# Patient Record
Sex: Female | Born: 1965
Health system: Southern US, Community
[De-identification: ages and names within clinical notes are randomized; demographics above are authoritative.]

## PROBLEM LIST (undated history)

## (undated) DIAGNOSIS — K589 Irritable bowel syndrome without diarrhea: Secondary | ICD-10-CM

## (undated) DIAGNOSIS — J42 Unspecified chronic bronchitis: Secondary | ICD-10-CM

## (undated) DIAGNOSIS — E1142 Type 2 diabetes mellitus with diabetic polyneuropathy: Secondary | ICD-10-CM

## (undated) DIAGNOSIS — R079 Chest pain, unspecified: Secondary | ICD-10-CM

## (undated) DIAGNOSIS — Z9989 Dependence on other enabling machines and devices: Secondary | ICD-10-CM

## (undated) DIAGNOSIS — F1010 Alcohol abuse, uncomplicated: Secondary | ICD-10-CM

## (undated) DIAGNOSIS — F17200 Nicotine dependence, unspecified, uncomplicated: Secondary | ICD-10-CM

## (undated) DIAGNOSIS — G4733 Obstructive sleep apnea (adult) (pediatric): Secondary | ICD-10-CM

## (undated) DIAGNOSIS — J449 Chronic obstructive pulmonary disease, unspecified: Secondary | ICD-10-CM

## (undated) DIAGNOSIS — K219 Gastro-esophageal reflux disease without esophagitis: Secondary | ICD-10-CM

## (undated) DIAGNOSIS — M7989 Other specified soft tissue disorders: Secondary | ICD-10-CM

## (undated) DIAGNOSIS — M549 Dorsalgia, unspecified: Secondary | ICD-10-CM

## (undated) DIAGNOSIS — M1990 Unspecified osteoarthritis, unspecified site: Secondary | ICD-10-CM

## (undated) DIAGNOSIS — H409 Unspecified glaucoma: Secondary | ICD-10-CM

## (undated) DIAGNOSIS — R519 Headache, unspecified: Secondary | ICD-10-CM

## (undated) DIAGNOSIS — D509 Iron deficiency anemia, unspecified: Secondary | ICD-10-CM

## (undated) DIAGNOSIS — K5900 Constipation, unspecified: Secondary | ICD-10-CM

## (undated) DIAGNOSIS — F909 Attention-deficit hyperactivity disorder, unspecified type: Secondary | ICD-10-CM

## (undated) DIAGNOSIS — R0789 Other chest pain: Secondary | ICD-10-CM

## (undated) DIAGNOSIS — N920 Excessive and frequent menstruation with regular cycle: Secondary | ICD-10-CM

## (undated) DIAGNOSIS — T8859XA Other complications of anesthesia, initial encounter: Secondary | ICD-10-CM

## (undated) DIAGNOSIS — E559 Vitamin D deficiency, unspecified: Secondary | ICD-10-CM

## (undated) DIAGNOSIS — J45909 Unspecified asthma, uncomplicated: Secondary | ICD-10-CM

## (undated) DIAGNOSIS — J9610 Chronic respiratory failure, unspecified whether with hypoxia or hypercapnia: Secondary | ICD-10-CM

## (undated) DIAGNOSIS — D649 Anemia, unspecified: Secondary | ICD-10-CM

## (undated) DIAGNOSIS — J9611 Chronic respiratory failure with hypoxia: Secondary | ICD-10-CM

## (undated) DIAGNOSIS — K259 Gastric ulcer, unspecified as acute or chronic, without hemorrhage or perforation: Secondary | ICD-10-CM

## (undated) DIAGNOSIS — M545 Low back pain, unspecified: Secondary | ICD-10-CM

## (undated) DIAGNOSIS — R112 Nausea with vomiting, unspecified: Secondary | ICD-10-CM

## (undated) DIAGNOSIS — K648 Other hemorrhoids: Secondary | ICD-10-CM

## (undated) DIAGNOSIS — I10 Essential (primary) hypertension: Secondary | ICD-10-CM

## (undated) DIAGNOSIS — G8929 Other chronic pain: Secondary | ICD-10-CM

## (undated) DIAGNOSIS — T4145XA Adverse effect of unspecified anesthetic, initial encounter: Secondary | ICD-10-CM

## (undated) DIAGNOSIS — E785 Hyperlipidemia, unspecified: Secondary | ICD-10-CM

## (undated) DIAGNOSIS — Z9981 Dependence on supplemental oxygen: Secondary | ICD-10-CM

## (undated) DIAGNOSIS — J189 Pneumonia, unspecified organism: Secondary | ICD-10-CM

## (undated) DIAGNOSIS — Z9889 Other specified postprocedural states: Secondary | ICD-10-CM

## (undated) DIAGNOSIS — F32A Depression, unspecified: Secondary | ICD-10-CM

## (undated) DIAGNOSIS — F1990 Other psychoactive substance use, unspecified, uncomplicated: Secondary | ICD-10-CM

## (undated) DIAGNOSIS — E782 Mixed hyperlipidemia: Secondary | ICD-10-CM

## (undated) DIAGNOSIS — Z8719 Personal history of other diseases of the digestive system: Secondary | ICD-10-CM

## (undated) DIAGNOSIS — E1169 Type 2 diabetes mellitus with other specified complication: Secondary | ICD-10-CM

## (undated) DIAGNOSIS — R918 Other nonspecific abnormal finding of lung field: Secondary | ICD-10-CM

## (undated) DIAGNOSIS — K829 Disease of gallbladder, unspecified: Secondary | ICD-10-CM

## (undated) DIAGNOSIS — F419 Anxiety disorder, unspecified: Secondary | ICD-10-CM

## (undated) DIAGNOSIS — E119 Type 2 diabetes mellitus without complications: Secondary | ICD-10-CM

## (undated) DIAGNOSIS — E1149 Type 2 diabetes mellitus with other diabetic neurological complication: Secondary | ICD-10-CM

## (undated) DIAGNOSIS — Z9289 Personal history of other medical treatment: Secondary | ICD-10-CM

## (undated) DIAGNOSIS — E1165 Type 2 diabetes mellitus with hyperglycemia: Secondary | ICD-10-CM

## (undated) DIAGNOSIS — R809 Proteinuria, unspecified: Secondary | ICD-10-CM

## (undated) DIAGNOSIS — R071 Chest pain on breathing: Secondary | ICD-10-CM

## (undated) DIAGNOSIS — R000 Tachycardia, unspecified: Secondary | ICD-10-CM

## (undated) DIAGNOSIS — M2550 Pain in unspecified joint: Secondary | ICD-10-CM

## (undated) DIAGNOSIS — E7800 Pure hypercholesterolemia, unspecified: Secondary | ICD-10-CM

## (undated) DIAGNOSIS — F17210 Nicotine dependence, cigarettes, uncomplicated: Secondary | ICD-10-CM

## (undated) DIAGNOSIS — R0602 Shortness of breath: Secondary | ICD-10-CM

## (undated) DIAGNOSIS — E1159 Type 2 diabetes mellitus with other circulatory complications: Secondary | ICD-10-CM

## (undated) DIAGNOSIS — F1911 Other psychoactive substance abuse, in remission: Secondary | ICD-10-CM

## (undated) DIAGNOSIS — R51 Headache: Secondary | ICD-10-CM

## (undated) DIAGNOSIS — F329 Major depressive disorder, single episode, unspecified: Secondary | ICD-10-CM

## (undated) HISTORY — DX: Essential (primary) hypertension: I10

## (undated) HISTORY — DX: Chronic obstructive pulmonary disease, unspecified: J44.9

## (undated) HISTORY — DX: Alcohol abuse, uncomplicated: F10.10

## (undated) HISTORY — DX: Pain in unspecified joint: M25.50

## (undated) HISTORY — DX: Type 2 diabetes mellitus with hyperglycemia: E11.65

## (undated) HISTORY — DX: Tachycardia, unspecified: R00.0

## (undated) HISTORY — PX: KIDNEY SURGERY: SHX687

## (undated) HISTORY — PX: OTHER SURGICAL HISTORY: SHX169

## (undated) HISTORY — DX: Type 2 diabetes mellitus with other circulatory complications: E11.59

## (undated) HISTORY — DX: Mixed hyperlipidemia: E78.2

## (undated) HISTORY — DX: Major depressive disorder, single episode, unspecified: F32.9

## (undated) HISTORY — DX: Other specified soft tissue disorders: M79.89

## (undated) HISTORY — DX: Other psychoactive substance use, unspecified, uncomplicated: F19.90

## (undated) HISTORY — DX: Unspecified glaucoma: H40.9

## (undated) HISTORY — DX: Nicotine dependence, unspecified, uncomplicated: F17.200

## (undated) HISTORY — DX: Nicotine dependence, cigarettes, uncomplicated: F17.210

## (undated) HISTORY — DX: Constipation, unspecified: K59.00

## (undated) HISTORY — DX: Type 2 diabetes mellitus with other diabetic neurological complication: E11.49

## (undated) HISTORY — DX: Depression, unspecified: F32.A

## (undated) HISTORY — DX: Attention-deficit hyperactivity disorder, unspecified type: F90.9

## (undated) HISTORY — DX: Type 2 diabetes mellitus with other specified complication: E11.69

## (undated) HISTORY — DX: Other psychoactive substance abuse, in remission: F19.11

## (undated) HISTORY — DX: Other nonspecific abnormal finding of lung field: R91.8

## (undated) HISTORY — DX: Vitamin D deficiency, unspecified: E55.9

## (undated) HISTORY — DX: Chest pain on breathing: R07.1

## (undated) HISTORY — DX: Dependence on supplemental oxygen: Z99.81

## (undated) HISTORY — PX: TONSILLECTOMY: SUR1361

## (undated) HISTORY — DX: Irritable bowel syndrome, unspecified: K58.9

## (undated) HISTORY — DX: Other chest pain: R07.89

## (undated) HISTORY — DX: Disease of gallbladder, unspecified: K82.9

## (undated) HISTORY — DX: Hyperlipidemia, unspecified: E78.5

## (undated) HISTORY — PX: FRACTURE SURGERY: SHX138

## (undated) HISTORY — DX: Dorsalgia, unspecified: M54.9

## (undated) HISTORY — DX: Chest pain, unspecified: R07.9

## (undated) HISTORY — PX: TYMPANOSTOMY TUBE PLACEMENT: SHX32

## (undated) HISTORY — DX: Iron deficiency anemia, unspecified: D50.9

## (undated) HISTORY — DX: Chronic respiratory failure, unspecified whether with hypoxia or hypercapnia: J96.10

---
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
## 1987-04-25 HISTORY — PX: TUBAL LIGATION: SHX77

## 1988-04-24 HISTORY — PX: CHOLECYSTECTOMY OPEN: SUR202

## 1993-04-24 HISTORY — PX: WRIST FRACTURE SURGERY: SHX121

## 1997-10-14 ENCOUNTER — Emergency Department (HOSPITAL_COMMUNITY): Admission: EM | Admit: 1997-10-14 | Discharge: 1997-10-14 | Payer: Self-pay | Admitting: Emergency Medicine

## 1999-03-26 ENCOUNTER — Emergency Department (HOSPITAL_COMMUNITY): Admission: EM | Admit: 1999-03-26 | Discharge: 1999-03-26 | Payer: Self-pay | Admitting: Emergency Medicine

## 1999-03-26 ENCOUNTER — Encounter: Payer: Self-pay | Admitting: Emergency Medicine

## 1999-09-23 ENCOUNTER — Emergency Department (HOSPITAL_COMMUNITY): Admission: EM | Admit: 1999-09-23 | Discharge: 1999-09-23 | Payer: Self-pay | Admitting: Emergency Medicine

## 1999-09-23 ENCOUNTER — Encounter: Payer: Self-pay | Admitting: Emergency Medicine

## 1999-11-18 ENCOUNTER — Other Ambulatory Visit: Admission: RE | Admit: 1999-11-18 | Discharge: 1999-11-18 | Payer: Self-pay | Admitting: Obstetrics and Gynecology

## 2000-05-22 ENCOUNTER — Emergency Department (HOSPITAL_COMMUNITY): Admission: EM | Admit: 2000-05-22 | Discharge: 2000-05-22 | Payer: Self-pay | Admitting: Emergency Medicine

## 2001-03-04 ENCOUNTER — Emergency Department (HOSPITAL_COMMUNITY): Admission: EM | Admit: 2001-03-04 | Discharge: 2001-03-04 | Payer: Self-pay

## 2001-03-06 ENCOUNTER — Emergency Department (HOSPITAL_COMMUNITY): Admission: EM | Admit: 2001-03-06 | Discharge: 2001-03-06 | Payer: Self-pay | Admitting: *Deleted

## 2001-11-07 ENCOUNTER — Encounter: Payer: Self-pay | Admitting: Emergency Medicine

## 2001-11-07 ENCOUNTER — Emergency Department (HOSPITAL_COMMUNITY): Admission: EM | Admit: 2001-11-07 | Discharge: 2001-11-07 | Payer: Self-pay | Admitting: Emergency Medicine

## 2001-11-30 ENCOUNTER — Emergency Department (HOSPITAL_COMMUNITY): Admission: EM | Admit: 2001-11-30 | Discharge: 2001-11-30 | Payer: Self-pay | Admitting: Emergency Medicine

## 2002-07-01 ENCOUNTER — Emergency Department (HOSPITAL_COMMUNITY): Admission: EM | Admit: 2002-07-01 | Discharge: 2002-07-01 | Payer: Self-pay | Admitting: Emergency Medicine

## 2002-09-26 ENCOUNTER — Encounter: Payer: Self-pay | Admitting: Emergency Medicine

## 2002-09-27 ENCOUNTER — Inpatient Hospital Stay (HOSPITAL_COMMUNITY): Admission: EM | Admit: 2002-09-27 | Discharge: 2002-09-30 | Payer: Self-pay | Admitting: Emergency Medicine

## 2002-10-08 ENCOUNTER — Ambulatory Visit (HOSPITAL_COMMUNITY): Admission: RE | Admit: 2002-10-08 | Discharge: 2002-10-08 | Payer: Self-pay | Admitting: Family Medicine

## 2002-10-28 ENCOUNTER — Encounter: Admission: RE | Admit: 2002-10-28 | Discharge: 2002-10-28 | Payer: Self-pay | Admitting: Family Medicine

## 2003-03-17 ENCOUNTER — Emergency Department (HOSPITAL_COMMUNITY): Admission: EM | Admit: 2003-03-17 | Discharge: 2003-03-17 | Payer: Self-pay | Admitting: Emergency Medicine

## 2004-07-08 ENCOUNTER — Emergency Department (HOSPITAL_COMMUNITY): Admission: EM | Admit: 2004-07-08 | Discharge: 2004-07-08 | Payer: Self-pay | Admitting: Emergency Medicine

## 2004-10-26 ENCOUNTER — Emergency Department (HOSPITAL_COMMUNITY): Admission: EM | Admit: 2004-10-26 | Discharge: 2004-10-27 | Payer: Self-pay | Admitting: Emergency Medicine

## 2005-02-15 ENCOUNTER — Inpatient Hospital Stay (HOSPITAL_COMMUNITY): Admission: EM | Admit: 2005-02-15 | Discharge: 2005-02-20 | Payer: Self-pay | Admitting: Emergency Medicine

## 2006-05-13 ENCOUNTER — Emergency Department (HOSPITAL_COMMUNITY): Admission: EM | Admit: 2006-05-13 | Discharge: 2006-05-13 | Payer: Self-pay | Admitting: Emergency Medicine

## 2007-03-16 ENCOUNTER — Emergency Department (HOSPITAL_COMMUNITY): Admission: EM | Admit: 2007-03-16 | Discharge: 2007-03-16 | Payer: Self-pay | Admitting: Emergency Medicine

## 2007-10-28 ENCOUNTER — Inpatient Hospital Stay (HOSPITAL_COMMUNITY): Admission: EM | Admit: 2007-10-28 | Discharge: 2007-11-04 | Payer: Self-pay | Admitting: Emergency Medicine

## 2008-04-07 ENCOUNTER — Emergency Department (HOSPITAL_COMMUNITY): Admission: EM | Admit: 2008-04-07 | Discharge: 2008-04-07 | Payer: Self-pay | Admitting: Emergency Medicine

## 2008-09-04 ENCOUNTER — Emergency Department (HOSPITAL_COMMUNITY): Admission: EM | Admit: 2008-09-04 | Discharge: 2008-09-04 | Payer: Self-pay | Admitting: Emergency Medicine

## 2009-12-06 ENCOUNTER — Emergency Department (HOSPITAL_COMMUNITY): Admission: EM | Admit: 2009-12-06 | Discharge: 2009-12-06 | Payer: Self-pay | Admitting: Emergency Medicine

## 2010-02-18 ENCOUNTER — Emergency Department (HOSPITAL_COMMUNITY): Admission: EM | Admit: 2010-02-18 | Discharge: 2010-02-18 | Payer: Self-pay | Admitting: Emergency Medicine

## 2010-03-13 ENCOUNTER — Emergency Department (HOSPITAL_COMMUNITY): Admission: EM | Admit: 2010-03-13 | Discharge: 2010-03-13 | Payer: Self-pay | Admitting: Emergency Medicine

## 2010-07-06 LAB — CBC
HCT: 31.4 % — ABNORMAL LOW (ref 36.0–46.0)
Hemoglobin: 9.6 g/dL — ABNORMAL LOW (ref 12.0–15.0)
MCH: 20.9 pg — ABNORMAL LOW (ref 26.0–34.0)
MCHC: 30.8 g/dL (ref 30.0–36.0)
MCV: 67.8 fL — ABNORMAL LOW (ref 78.0–100.0)
Platelets: 347 10*3/uL (ref 150–400)
RBC: 4.63 MIL/uL (ref 3.87–5.11)
RDW: 19 % — ABNORMAL HIGH (ref 11.5–15.5)
WBC: 12.1 10*3/uL — ABNORMAL HIGH (ref 4.0–10.5)

## 2010-07-06 LAB — BASIC METABOLIC PANEL
BUN: 6 mg/dL (ref 6–23)
CO2: 26 mEq/L (ref 19–32)
Calcium: 8.6 mg/dL (ref 8.4–10.5)
Chloride: 102 mEq/L (ref 96–112)
Creatinine, Ser: 0.67 mg/dL (ref 0.4–1.2)
GFR calc Af Amer: >60 mL/min (ref >60)
GFR calc non Af Amer: >60 mL/min (ref >60)
Glucose, Bld: 139 mg/dL — ABNORMAL HIGH (ref 70–99)
Potassium: 3.4 mEq/L — ABNORMAL LOW (ref 3.5–5.1)
Sodium: 138 mEq/L (ref 135–145)

## 2010-07-25 ENCOUNTER — Emergency Department (HOSPITAL_COMMUNITY): Payer: Self-pay

## 2010-07-25 ENCOUNTER — Emergency Department (HOSPITAL_COMMUNITY)
Admission: EM | Admit: 2010-07-25 | Discharge: 2010-07-26 | Disposition: A | Discharge status: Home / Self Care | Payer: Self-pay | Attending: Emergency Medicine | Admitting: Emergency Medicine

## 2010-07-25 DIAGNOSIS — R10819 Abdominal tenderness, unspecified site: Secondary | ICD-10-CM | POA: Insufficient documentation

## 2010-07-25 DIAGNOSIS — R109 Unspecified abdominal pain: Secondary | ICD-10-CM | POA: Insufficient documentation

## 2010-07-25 DIAGNOSIS — J4489 Other specified chronic obstructive pulmonary disease: Secondary | ICD-10-CM | POA: Insufficient documentation

## 2010-07-25 DIAGNOSIS — D649 Anemia, unspecified: Secondary | ICD-10-CM | POA: Insufficient documentation

## 2010-07-25 DIAGNOSIS — R7309 Other abnormal glucose: Secondary | ICD-10-CM | POA: Insufficient documentation

## 2010-07-25 DIAGNOSIS — R112 Nausea with vomiting, unspecified: Secondary | ICD-10-CM | POA: Insufficient documentation

## 2010-07-25 DIAGNOSIS — J449 Chronic obstructive pulmonary disease, unspecified: Secondary | ICD-10-CM | POA: Insufficient documentation

## 2010-07-25 DIAGNOSIS — R630 Anorexia: Secondary | ICD-10-CM | POA: Insufficient documentation

## 2010-07-25 LAB — BASIC METABOLIC PANEL
BUN: 7 mg/dL (ref 6–23)
CO2: 27 mEq/L (ref 19–32)
Calcium: 9.1 mg/dL (ref 8.4–10.5)
Chloride: 98 mEq/L (ref 96–112)
Creatinine, Ser: 0.73 mg/dL (ref 0.4–1.2)
GFR calc Af Amer: >60 mL/min (ref >60)
GFR calc non Af Amer: >60 mL/min (ref >60)
Glucose, Bld: 148 mg/dL — ABNORMAL HIGH (ref 70–99)
Potassium: 3.5 mEq/L (ref 3.5–5.1)
Sodium: 135 mEq/L (ref 135–145)

## 2010-07-25 LAB — HEPATIC FUNCTION PANEL
ALT: 15 U/L (ref 0–35)
AST: 18 U/L (ref 0–37)
Albumin: 3.2 g/dL — ABNORMAL LOW (ref 3.5–5.2)
Alkaline Phosphatase: 44 U/L (ref 39–117)
Bilirubin, Direct: 0.1 mg/dL (ref 0.0–0.3)
Indirect Bilirubin: 0.3 mg/dL (ref 0.3–0.9)
Total Bilirubin: 0.4 mg/dL (ref 0.3–1.2)
Total Protein: 6.9 g/dL (ref 6.0–8.3)

## 2010-07-25 LAB — CBC
HCT: 33.5 % — ABNORMAL LOW (ref 36.0–46.0)
Hemoglobin: 9.5 g/dL — ABNORMAL LOW (ref 12.0–15.0)
MCH: 20.5 pg — ABNORMAL LOW (ref 26.0–34.0)
MCHC: 28.4 g/dL — ABNORMAL LOW (ref 30.0–36.0)
MCV: 72.2 fL — ABNORMAL LOW (ref 78.0–100.0)
Platelets: 335 10*3/uL (ref 150–400)
RBC: 4.64 MIL/uL (ref 3.87–5.11)
RDW: 18.7 % — ABNORMAL HIGH (ref 11.5–15.5)
WBC: 14.4 10*3/uL — ABNORMAL HIGH (ref 4.0–10.5)

## 2010-07-25 LAB — DIFFERENTIAL
Basophils Absolute: 0 10*3/uL (ref 0.0–0.1)
Basophils Relative: 0 % (ref 0–1)
Eosinophils Absolute: 0.1 10*3/uL (ref 0.0–0.7)
Eosinophils Relative: 1 % (ref 0–5)
Lymphocytes Relative: 26 % (ref 12–46)
Lymphs Abs: 3.7 10*3/uL (ref 0.7–4.0)
Monocytes Absolute: 0.9 10*3/uL (ref 0.1–1.0)
Monocytes Relative: 6 % (ref 3–12)
Neutro Abs: 9.7 10*3/uL — ABNORMAL HIGH (ref 1.7–7.7)
Neutrophils Relative %: 67 % (ref 43–77)

## 2010-07-25 LAB — LIPASE, BLOOD: Lipase: 25 U/L (ref 11–59)

## 2010-07-25 LAB — PREGNANCY, URINE: Preg Test, Ur: NEGATIVE

## 2010-07-26 LAB — URINALYSIS, ROUTINE W REFLEX MICROSCOPIC
Bilirubin Urine: NEGATIVE
Glucose, UA: NEGATIVE mg/dL
Hgb urine dipstick: NEGATIVE
Ketones, ur: NEGATIVE mg/dL
Nitrite: NEGATIVE
Protein, ur: NEGATIVE mg/dL
Specific Gravity, Urine: 1.025 (ref 1.005–1.030)
Urobilinogen, UA: 0.2 mg/dL (ref 0.0–1.0)
pH: 5.5 (ref 5.0–8.0)

## 2010-07-26 MED ORDER — IOHEXOL 300 MG/ML  SOLN
100.0000 mL | Freq: Once | INTRAMUSCULAR | Status: AC | PRN
  Administered 2010-07-26: 100 mL via INTRAVENOUS

## 2010-08-02 LAB — POCT CARDIAC MARKERS
CKMB, poc: <1 ng/mL — ABNORMAL LOW (ref 1.0–8.0)
Myoglobin, poc: 63.1 ng/mL (ref 12–200)
Troponin i, poc: <0.05 ng/mL (ref 0.00–0.09)

## 2010-08-02 LAB — DIFFERENTIAL
Basophils Absolute: 0.1 10*3/uL (ref 0.0–0.1)
Basophils Relative: 1 % (ref 0–1)
Eosinophils Absolute: 0.1 10*3/uL (ref 0.0–0.7)
Eosinophils Relative: 1 % (ref 0–5)
Lymphocytes Relative: 30 % (ref 12–46)
Lymphs Abs: 3.6 10*3/uL (ref 0.7–4.0)
Monocytes Absolute: 0.7 10*3/uL (ref 0.1–1.0)
Monocytes Relative: 6 % (ref 3–12)
Neutro Abs: 7.4 10*3/uL (ref 1.7–7.7)
Neutrophils Relative %: 62 % (ref 43–77)

## 2010-08-02 LAB — CBC
HCT: 31.8 % — ABNORMAL LOW (ref 36.0–46.0)
Hemoglobin: 10.2 g/dL — ABNORMAL LOW (ref 12.0–15.0)
MCHC: 32.1 g/dL (ref 30.0–36.0)
MCV: 68 fL — ABNORMAL LOW (ref 78.0–100.0)
Platelets: 307 10*3/uL (ref 150–400)
RBC: 4.68 MIL/uL (ref 3.87–5.11)
RDW: 18.9 % — ABNORMAL HIGH (ref 11.5–15.5)
WBC: 12.1 10*3/uL — ABNORMAL HIGH (ref 4.0–10.5)

## 2010-08-02 LAB — BASIC METABOLIC PANEL
BUN: 4 mg/dL — ABNORMAL LOW (ref 6–23)
CO2: 30 mEq/L (ref 19–32)
Calcium: 8.6 mg/dL (ref 8.4–10.5)
Chloride: 102 mEq/L (ref 96–112)
Creatinine, Ser: 0.63 mg/dL (ref 0.4–1.2)
GFR calc Af Amer: >60 mL/min (ref >60)
GFR calc non Af Amer: >60 mL/min (ref >60)
Glucose, Bld: 142 mg/dL — ABNORMAL HIGH (ref 70–99)
Potassium: 3.5 mEq/L (ref 3.5–5.1)
Sodium: 136 mEq/L (ref 135–145)

## 2010-09-01 ENCOUNTER — Emergency Department (HOSPITAL_COMMUNITY)
Admission: EM | Admit: 2010-09-01 | Discharge: 2010-09-01 | Disposition: A | Discharge status: Home / Self Care | Payer: Self-pay | Attending: Emergency Medicine | Admitting: Emergency Medicine

## 2010-09-01 ENCOUNTER — Encounter (HOSPITAL_COMMUNITY): Payer: Self-pay | Admitting: Radiology

## 2010-09-01 ENCOUNTER — Emergency Department (HOSPITAL_COMMUNITY): Payer: Self-pay

## 2010-09-01 DIAGNOSIS — J4489 Other specified chronic obstructive pulmonary disease: Secondary | ICD-10-CM | POA: Insufficient documentation

## 2010-09-01 DIAGNOSIS — R197 Diarrhea, unspecified: Secondary | ICD-10-CM | POA: Insufficient documentation

## 2010-09-01 DIAGNOSIS — D72829 Elevated white blood cell count, unspecified: Secondary | ICD-10-CM | POA: Insufficient documentation

## 2010-09-01 DIAGNOSIS — J449 Chronic obstructive pulmonary disease, unspecified: Secondary | ICD-10-CM | POA: Insufficient documentation

## 2010-09-01 DIAGNOSIS — R109 Unspecified abdominal pain: Secondary | ICD-10-CM | POA: Insufficient documentation

## 2010-09-01 DIAGNOSIS — E119 Type 2 diabetes mellitus without complications: Secondary | ICD-10-CM | POA: Insufficient documentation

## 2010-09-01 LAB — LIPASE, BLOOD: Lipase: 20 U/L (ref 11–59)

## 2010-09-01 LAB — COMPREHENSIVE METABOLIC PANEL
ALT: 18 U/L (ref 0–35)
AST: 17 U/L (ref 0–37)
Albumin: 3.6 g/dL (ref 3.5–5.2)
Alkaline Phosphatase: 52 U/L (ref 39–117)
BUN: 9 mg/dL (ref 6–23)
CO2: 28 mEq/L (ref 19–32)
Calcium: 9.8 mg/dL (ref 8.4–10.5)
Chloride: 100 mEq/L (ref 96–112)
Creatinine, Ser: 0.69 mg/dL (ref 0.4–1.2)
GFR calc Af Amer: >60 mL/min (ref >60)
GFR calc non Af Amer: >60 mL/min (ref >60)
Glucose, Bld: 115 mg/dL — ABNORMAL HIGH (ref 70–99)
Potassium: 4.4 mEq/L (ref 3.5–5.1)
Sodium: 137 mEq/L (ref 135–145)
Total Bilirubin: 0.3 mg/dL (ref 0.3–1.2)
Total Protein: 7.5 g/dL (ref 6.0–8.3)

## 2010-09-01 LAB — DIFFERENTIAL
Basophils Absolute: 0 10*3/uL (ref 0.0–0.1)
Basophils Relative: 0 % (ref 0–1)
Eosinophils Absolute: 0.1 10*3/uL (ref 0.0–0.7)
Eosinophils Relative: 1 % (ref 0–5)
Lymphocytes Relative: 27 % (ref 12–46)
Lymphs Abs: 3.9 10*3/uL (ref 0.7–4.0)
Monocytes Absolute: 0.9 10*3/uL (ref 0.1–1.0)
Monocytes Relative: 6 % (ref 3–12)
Neutro Abs: 9.7 10*3/uL — ABNORMAL HIGH (ref 1.7–7.7)
Neutrophils Relative %: 66 % (ref 43–77)

## 2010-09-01 LAB — URINALYSIS, ROUTINE W REFLEX MICROSCOPIC
Bilirubin Urine: NEGATIVE
Glucose, UA: NEGATIVE mg/dL
Hgb urine dipstick: NEGATIVE
Nitrite: NEGATIVE
Protein, ur: NEGATIVE mg/dL
Specific Gravity, Urine: >1.03 — ABNORMAL HIGH (ref 1.005–1.030)
Urobilinogen, UA: 0.2 mg/dL (ref 0.0–1.0)
pH: 5.5 (ref 5.0–8.0)

## 2010-09-01 LAB — CBC
HCT: 34.1 % — ABNORMAL LOW (ref 36.0–46.0)
Hemoglobin: 9.7 g/dL — ABNORMAL LOW (ref 12.0–15.0)
MCH: 20.6 pg — ABNORMAL LOW (ref 26.0–34.0)
MCHC: 28.4 g/dL — ABNORMAL LOW (ref 30.0–36.0)
MCV: 72.4 fL — ABNORMAL LOW (ref 78.0–100.0)
Platelets: 362 10*3/uL (ref 150–400)
RBC: 4.71 MIL/uL (ref 3.87–5.11)
RDW: 18.7 % — ABNORMAL HIGH (ref 11.5–15.5)
WBC: 14.6 10*3/uL — ABNORMAL HIGH (ref 4.0–10.5)

## 2010-09-01 LAB — POCT PREGNANCY, URINE: Preg Test, Ur: NEGATIVE

## 2010-09-01 MED ORDER — IOHEXOL 300 MG/ML  SOLN
120.0000 mL | Freq: Once | INTRAMUSCULAR | Status: AC | PRN
  Administered 2010-09-01: 120 mL via INTRAVENOUS

## 2010-09-06 NOTE — H&P (Signed)
[name], HACKLEY            ACCOUNT NO.:  [id number]   MEDICAL RECORD NO.:  [id number]          PATIENT TYPE:  INP   LOCATION:  A318                          FACILITY:  [facility]   PHYSICIAN:  Osvaldo Shipper, MD     DATE OF BIRTH:  Jan 01, 1966   DATE OF ADMISSION:  10/28/2007  DATE OF DISCHARGE:  LH                              HISTORY & PHYSICAL   ADDENDUM:  We obtained finally the records from [facility], and I will  just briefly summarize them.  She was seen there on October 24, 2007 in the  ED and was subsequently discharged on October 26 2007.  She spent 2 nights  in the hospital.  She was admitted by Dr. Mickel Fuchs.   She was also seen by cardiologist, Dr. Earna Coder, who noted that her ECG  showed sinus tachycardia with nonspecific ST and T-wave changes, which  he did not specify in his dictation, but he recommended outpatient  evaluation.   A 2-D echocardiogram showed a technically difficult study with normal EF  65% with no segmental wall motion abnormalities.  Left ventricular  hypertrophy was noted, dilated right ventricle with mild TR was noted.   The patient also underwent CT of her chest with contrast, which did not  show any evidence for pulmonary emboli.  Atelectasis mostly in the right  lower lobe was noted.   She underwent ultrasound of the abdomen which showed prior  cholecystectomy.  CBD was 7 mm.  No other abnormalities were noted.   Portable chest x-ray did not show any acute abnormalities in the lungs.  Heart was noted to be mildly enlarged.   The patient's lab tests showed that she was positive for marijuana.  Cocaine was negative.   Her hemoglobin was stable, between 9 and 10, MCV was 70.  Platelet count  was normal.  White count was elevated at 14,000 when she presented and  when she was discharged was 20,000, probably a result of steroids.  D-  dimer was within the normal range.  BUN and creatinine were within  normal limits.  Glucose was elevated 160s to 200s.   HbA1c was 7.4.  Cardiac markers are negative x3.  Total cholesterol was 143,  triglycerides 52, HDL was 26, LDL was 107.  TSH was 0.9.  UA showed  small blood, otherwise unremarkable.   So in view of all of the above, no need to proceed with a CAT scan or D-  dimers here.  I will go ahead and get venous Dopplers of the lower  extremity.   She possibly has type 2 diabetes and would benefit from medications,  such as metformin, which she should probably be prescribed when she is  discharged.  She will require substance abuse counseling as well.   Her chest pain is most likely as a result of her bronchitis.  Anemia as  a result of menorrhagia, and I have told her that she needs to be seen  by a gynecologist when she is discharged from the hospital.  Iron  profile studies will be  sent off.  So, it appears that most of  her issues are because of  bronchitis and her tobacco abuse.  She probably does require cardiac  evaluation but that can potentially be done as an outpatient.   Please also note that the patient requires a PMD to be assigned to her  when she is discharged from the hospital.      Osvaldo Shipper, MD  Electronically Signed     GK/MEDQ  D:  10/28/2007  T:  10/28/2007  Job:  [id number]

## 2010-09-06 NOTE — Group Therapy Note (Signed)
NAMESYNCERE, KAMINSKI            ACCOUNT NO.:  [id number]   MEDICAL RECORD NO.:  [id number]          PATIENT TYPE:  INP   LOCATION:  A318                          FACILITY:  [facility]   PHYSICIAN:  Skeet Latch, DO    DATE OF BIRTH:  1965-11-24   DATE OF PROCEDURE:  11/01/2007  DATE OF DISCHARGE:                                 PROGRESS NOTE   SUBJECTIVE:  The patient states that she has been doing a little bit  more coughing today.  She does not feel as well as she did yesterday,  but has slight improvement.   OBJECTIVE:  VITAL SIGNS:  Temperature 97.7, respirations 15, heart rate  74, blood pressure 165/88, and she is saturating 96% on room air.  CARDIOVASCULAR:  Regular rate and rhythm with no murmurs, rubs, or  gallops.  LUNGS:  She still has some rhonchi and end expiratory wheezing is noted.  ABDOMEN:  Obese, soft, nontender, and nondistended.  Positive bowel  sounds with no rigidity or guarding.  EXTREMITIES:  Trace edema.  No clubbing or cyanosis.   LABORATORY DATA:  Sodium 138, potassium 3.9, chloride 103, CO2 28, BUN  15, creatinine 0.80, glucose 186.  White count 10,000, hematocrit 32.0,  white count 15.5, platelets 333.   ASSESSMENT:  1. Acute bronchitis versus chronic obstructive pulmonary disease.  The      patient continues IV antibiotics and continues to be on IV      steroids.  The patient is also on Mucinex, Tessalon Perles, and      Tussionex and she will continue at this time.  2. Nicotine abuse.  The patient continues to be on nicotine patch with      continued counseling regarding tobacco abuse.  3. The patient has a history of polysubstance abuse and still needs      counseling.  4. Some anxiety component.  The patient continues to be on Xanax twice      a day as needed.  5. I spoke with the patient regarding hemoglobin A1C of 6.5 and      diabetes.  We will get a dietary consult at this time.  The patient      will need close follow-up as an outpatient  regarding her A1C.  It      probably needs to be rechecked in the next 2-3 months and decide if      the patient may need to be started on medication.  The patient will      continue to be on Lantus during her hospital stay at this time.      Skeet Latch, DO  Electronically Signed     SM/MEDQ  D:  11/01/2007  T:  11/01/2007  Job:  [id number]

## 2010-09-06 NOTE — Group Therapy Note (Signed)
[name], TOUCH            ACCOUNT NO.:  [id number]   MEDICAL RECORD NO.:  [id number]          PATIENT TYPE:  INP   LOCATION:  A318                          FACILITY:  [facility]   PHYSICIAN:  [name], M.D.DATE OF BIRTH:  25-Jan-1966   DATE OF PROCEDURE:  DATE OF DISCHARGE:                                 PROGRESS NOTE   The patient of [facility] Team.   [name] says she is still short of breath, still coughing, and  still having some wheeze.  She is otherwise about the same.  She has no  new complaints.   PHYSICAL EXAMINATION:  VITAL SIGNS:  Her blood sugar has been up.  Her  exam today shows temperature is 97, pulse 81, respirations 18, blood  pressure 135/82, and blood sugars 202.  CHEST:  Clearer than yesterday,  but she is still got some wheezing.  HEART:  Regular.   ASSESSMENT:  I think, she is perhaps a little better, but I am going to  add some inhaled steroids to see if it makes any difference.  Continue  with everything else and follow.      [name], M.D.  Electronically Signed     ELH/MEDQ  D:  10/31/2007  T:  10/31/2007  Job:  [id number]

## 2010-09-06 NOTE — Group Therapy Note (Signed)
NAMEGREYSEN, SWANTON            ACCOUNT NO.:  [id number]   MEDICAL RECORD NO.:  [id number]          PATIENT TYPE:  INP   LOCATION:  A318                          [facility]  [facility]   PHYSICIAN:  Edward L. Juanetta Gosling, M.D.DATE OF BIRTH:  [birth year]   DATE OF PROCEDURE:  DATE OF DISCHARGE:                                 PROGRESS NOTE   [name] says she is a little bit better now that she has had a less  cough.  She does not have as much congestion.  She is bringing up a  little bit of sputum.  She is not having as much pain when she coughs.   PHYSICAL EXAMINATION:  VITAL SIGNS:  This morning shows a temperature is  97, pulse is 79, respirations 20, blood pressure 134/79, and blood sugar  is 184.  CHEST:  Clear with wheezes on the left.   LABORATORY DATA:  Her white blood count is still 17,000 this morning and  hemoglobin is 10.5.  Electrolytes show her CO2 is 33 and glucose of 165.   ASSESSMENT:  I think she does have chronic obstructive pulmonary  disease.  I have told her we will need to do an outpatient pulmonary  function test.  She does slowly seem to be improving.  I do not plan to  change anything else.      Edward L. Juanetta Gosling, M.D.  Electronically Signed     ELH/MEDQ  D:  11/02/2007  T:  11/02/2007  Job:  [id number]

## 2010-09-06 NOTE — Group Therapy Note (Signed)
[name], [name]            ACCOUNT NO.:  [id number]   MEDICAL RECORD NO.:  [id number]          PATIENT TYPE:  INP   LOCATION:  A318                          [facility]   PHYSICIAN:  Edward L. Juanetta Gosling, M.D.DATE OF BIRTH:  1966/01/05   DATE OF PROCEDURE:  DATE OF DISCHARGE:  11/04/2007                                 PROGRESS NOTE   SUBJECTIVE:  [name] says she is doing about the same, but more  wheezy this morning than she was yesterday.  She has no other new  complaints.   PHYSICAL EXAMINATION:  GENERAL:  She is awake and alert.  VITAL SIGNS:  Her last recorded blood pressure is 113/77, she is  afebrile, pulse is 112, respirations 20, O2 sat is 98% on room air, and  blood sugar was 318.   ASSESSMENT:  She has asthma, probable chronic obstructive pulmonary  disease, certainly an acute bronchitis.  She is diabetic and she does  not seem quite as good today as she was before.   PLAN:  My plan then is to continue her treatment with medications and  follow.      Edward L. Juanetta Gosling, M.D.  Electronically Signed     ELH/MEDQ  D:  11/04/2007  T:  11/04/2007  Job:  [id number]

## 2010-09-06 NOTE — Group Therapy Note (Signed)
NAMEMARZETTA, LANZA            ACCOUNT NO.:  [id number]   MEDICAL RECORD NO.:  [id number]          PATIENT TYPE:  INP   LOCATION:  A318                          [facility]   PHYSICIAN:  Edward L. Juanetta Gosling, M.D.DATE OF BIRTH:  January 14, 1966   DATE OF PROCEDURE:  DATE OF DISCHARGE:                                 PROGRESS NOTE   The patient of the [facility] Hospitalist Team.   [name] was admitted with bronchitis versus asthma/COPD.  She says  she is better this morning.  She has had a terrible problem with cough  and congestion and she has not been able to bring anything up.  She is  not wheezing now.  She says she is much more comfortable and her cough  is better.   PHYSICAL EXAMINATION:  VITAL SIGNS:  Temperature is 97.8; pulse 73;  respirations 20; blood pressure 119/61; and O2 sats 91% on room air,  later 96% on room air.  Her obesity of course is unchanged.  CHEST:  No  wheezing now.  She is moving air much better and has much better effort.   ASSESSMENT:  She is overall improved.   PLAN:  She will need to have a outpatient evaluation to see if she in  fact has COPD or if it is simply asthma.      Edward L. Juanetta Gosling, M.D.  Electronically Signed     ELH/MEDQ  D:  11/03/2007  T:  11/03/2007  Job:  [id number]

## 2010-09-06 NOTE — H&P (Signed)
[name], PATCH            ACCOUNT NO.:  [id number]   MEDICAL RECORD NO.:  [id number]          PATIENT TYPE:  EMS   LOCATION:  ED                            [facility]   PHYSICIAN:  Osvaldo Shipper, MD     DATE OF BIRTH:  05/27/1965   DATE OF ADMISSION:  10/28/2007  DATE OF DISCHARGE:  LH                              HISTORY & PHYSICAL   PRIMARY CARE PHYSICIAN:  None.   ADMISSION DIAGNOSES:  1. Acute bronchitis.  2. Obesity.  3. Chest pain, likely related to bronchitis.  4. Recent hospitalization in [place].   CHIEF COMPLAINT:  Chest pain and shortness of breath since last  Thursday.   HISTORY OF PRESENT ILLNESS:  The patient is a 45 year old Caucasian  female who is obese, who smokes, who has a history of bronchitis and  COPD exacerbation in the past, who was in her usual state of health  until this past Wednesday and Thursday, when she started coughing with  no expectorations. She started noticing shortness of breath at rest as  well as with activity. She was noticing wheezing. This was followed by  dizziness, light headedness, and presyncopal episodes. She also started  getting chest tightness and sharp chest pains, which were made worse by  cough and deep breathing. The patient was taken to the emergency  department in [place] last week. The patient was admitted there for  further evaluation. She was subsequently discharged home on October 26, 2007. She tells me that she underwent CT scan of the chest. She  underwent an echocardiogram and ultrasound of the abdomen and EKG's and  she was told that she may require outpatient stress test. The patient  was also told that she was anemic. She was put on steroids and was  subsequently discharged. Over the past couple of days, she has not felt  any better. She feels that she has not improved and so she presents back  to the hospital. Chest pain is about a 4 or 5 out of 10 in intensity,  made worse by deep breathing and  coughing. The pain is located in the  retrosternal area, as well as the right and left side of the chest, as  well as underneath her left breast. She feels that after 3 breathing  treatments, she feels a little bit better.   HOME MEDICATIONS:  She does not take any scheduled medications. From the  recent discharge, she was on prednisone and Amoxicillin and also was on  Omeprazole.   ALLERGIES:  CODEINE.   PAST MEDICAL HISTORY:  Positive for cholecystectomy, Cesarean section  x2, kidney surgeries as a child for blockages. She also has bronchitis,  is obese, has been diagnosed with hyperglycemia in the past.   SOCIAL HISTORY:  Lives in [place] with her son. She works as a  Engineer, materials. She has somewhat of a sedentary job. Smokes 1  pack of cigarettes on a daily basis. She has been smoking for 30 years.  No alcohol consumption for the last few weeks. No illicit drug use.   FAMILY HISTORY:  Father died  of a sudden heart attack at the age of 31.  Mother had CHF and diabetes and died of MI at the age of 44. Her mother  also had breast cancer.   REVIEW OF SYSTEMS:  GENERAL:  She tells me that she snores at night and  has been noted to stop breathing for a while when she snores at night as  well. Otherwise, she is mentioning dizziness at times, weakness, and  malaise. HEENT:  Unremarkable. CARDIOVASCULAR:  As per HPI. RESPIRATORY:  As per HPI.  GASTROINTESTINAL:  Positive for diarrhea for the last 4 days. Denies any  overt blood in the stool. GENITOURINARY:  Positive for heavy periods.  She is currently having her period. MUSCULOSKELETAL:  Unremarkable.  NEUROLOGIC:  Unremarkable.   PHYSICAL EXAMINATION:  VITAL SIGNS:  Temperature 98.4, blood pressure  139/62, heart rate 98, respiratory rate 18. Saturation 91% on room, was  97% on room air as well.  GENERAL:  An obese white female. Very anxious. No distress.  HEENT:  There is no pallor, no icterus. Oral mucosa is moist. No  oral  lesions are noted.  NECK:  Soft and supple. No thyromegaly is appreciated.  LUNGS:  Rhonchi and wheezing bilaterally and more so in the lower lung  fields.  CARDIOVASCULAR:  S1 and S2 is normal and regular. No murmurs  appreciated. No S3 or S4 noted.  ABDOMEN:  Obese. Soft, nontender, and nondistended. Bowel sounds  present.  EXTREMITIES:  No pitting edema. No erythema is noted. She does mention  some calf tenderness bilaterally.  NEUROLOGIC:  Alert and oriented times three. No focal neurological  deficits are present.   DIAGNOSTIC STUDIES:  She had a chest x-ray which did not reveal any  active process.   LABORATORY DATA:  ABG showed pH of 7.45, pCO2 of 34, pO2 of 73.  Saturation 95%. This was done on room air.   I am awaiting all of the testing that she had done in [place], which  potentially includes the following according to the patient:  1. CT scan of the chest with contrast.  2. Echocardiogram.  3. Ultrasound of the abdomen.  4. Blood work.  5. EKG's.   ASSESSMENT/PLAN:  This is a 45 year old Caucasian female who smokes, who  is obese, who has had exacerbation of COPD in the past, who presents  with a 5 day history of worsening shortness of breath, cough, and chest  pain. This is most likely bronchitis. Smoking cessation counseling was  provided. The differential diagnosis also includes PE, considering the  recent hospitalization, her presentation, and the fact that she is obese  but she has had a recent CT scan, I do not want to repeat any additional  testing until we get the results of those tests back. Chest tightness is  probably also related to her bronchitis. She does have a family history  of premature cardiac disease and so requires outpatient evaluation.  1. ACUTE BRONCHITIS:  Admit to the hospital. Put her on steroids,      nebulizer treatments, antibiotics. Pulmonary consultation may be      considered if she does not improve.  2. CHEST TIGHTNESS:   Likely related to the bronchitis. We will repeat      EKG, do cardiac markers, and await report of the echocardiogram and      electrocardiograms that were done at [facility] recently. Depending      on the results of those tests, additional testing may be  considered. Otherwise, she will probably just require outpatient      cardiac evaluation.  3. POSSIBLE OBSTRUCTIVE SLEEP APNEA:  She requires an outpatient sleep      study.  4. OBESITY.  5. TOBACCO ABUSE:  Counseling was provided. Nicotine patch will be      utilized.  6. LOWER EXTREMITY PAIN AND SWELLING:  A venous Doppler will be      ordered.  7. ANEMIA:  She tells me that she has a history of anemia, so we will      go ahead and check a CBC on this individual.  8. HYPERGLYCEMIA:  Hyperglycemia is also noted to be present. We will      go ahead and check her B-met, as well as a HbA1C.   Urine drug screen will be checked as well. She has a history of  marijuana abuse in the past.   CODE STATUS:  FULL CODE. She will be monitored closely on telemetry for  now.   Depending on evaluation that was done in [place], further testing may  be considered.   ALSO SEE ADDENDUM.      Osvaldo Shipper, MD  Electronically Signed     GK/MEDQ  D:  10/28/2007  T:  10/28/2007  Job:  [id number]

## 2010-09-06 NOTE — Consult Note (Signed)
[name], [name]            ACCOUNT NO.:  [id number]   MEDICAL RECORD NO.:  [id number]          PATIENT TYPE:  INP   LOCATION:  A318                          FAC[facility]   PHYSICIAN:  Edward L. Juanetta Gosling, M.D.DATE OF BIRTH:  09-13-1965   DATE OF CONSULTATION:  DATE OF DISCHARGE:                                 CONSULTATION   REASON FOR CONSULTATION:  Shortness of breath and probable chronic  obstructive pulmonary disease.   HISTORY:  [name] is a 45 year old who was actually hospitalized at  [facility] in [place], [place] after having had a syncopal  episode.  She was treated there with IV fluids, etc., she also had some  cough and congestion and apparently was thought to have bronchitis.  She  was eventually released on July 4.  She was evaluated with  echocardiogram, chest CT, ultrasound of the abdomen, portable chest x-  ray with mild cardiac enlargement.  She had trouble getting her  medications on July 4, did not start them until July 5, became worse  over the next 24 hours, and presented to our emergency room on July 6  and she was admitted from there, was thought that she had acute  bronchitis.  She said that she was coughing.  She was not coughing much  sputum up.  She had a lot of discomfort in her chest.  She says that her  cough is still a problem, she still is short of breath.  She did not had  much relief and she did not coughed up much of anything.  Her  medications at home are prednisone, amoxicillin, and omeprazole, all of  which are new.  She is allergic to codeine.  Surgically, she has had a  cholecystectomy, a C-section x2, some sort of kidney surgeries as a  child in her childhood, has a history of bronchitis, history of obesity,  and has been evaluated for COPD about 5 years ago, which she says was  negative.   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  She smokes about a pack of cigarettes daily.   She has 30  pack-year smoking history.  She does not use alcohol and she apparently  does not use illicit drugs, although her drug screen may have shown  marijuana when she was in [place].   FAMILY HISTORY:  Father died suddenly in his 61s as presumably of a  heart attack.  Her mother died in her 61s of heart attack associated  with diabetes and had CHF and breast cancer.  She does have a history  suggestive of sleep apnea.   Her review of systems otherwise negative.   PHYSICAL EXAMINATION:  GENERAL:  An obese female who does not appear to  be in acute distress now.  She looks fairly comfortable.  VITALS:  Her temperature was 96.9, pulse 72, respirations 20, blood  pressure 142/73, blood sugars 231.  HEENT:  Pupils are reactive.  Nose and throat are clear.  NECK:  Supple without masses.  Her chest shows decreased breath sounds,  some rhonchi, and no wheezing,  no rales.  HEART:  Regular without gallop.  ABDOMEN:  Soft without masses.  EXTREMITIES:  No edema.  CNS:  Grossly intact.   LABORATORY WORK:  BMET today shows a glucose of 178, otherwise normal.   OTHER LABS:  CBC shows white count 14,900, hemoglobin is 9.8, platelets  314, blood cultures thus far are negative.   Her chest x-ray here, lungs are clear.  No active disease.   ASSESSMENT:  I think she probably has acute bronchitis, but probably has  chronic bronchitis as well.  I agree with her current treatments, which  are steroids, Mucinex, Tessalon.  She is on Levaquin.  I think that is  an appropriate antibiotic and she does take it slowly.  She will need to  have pulmonary function testing done to confirm whether she has indeed  has chronic obstructive pulmonary disease with that should be done as an  outpatient once she is clear.      Edward L. Juanetta Gosling, M.D.  Electronically Signed     ELH/MEDQ  D:  10/30/2007  T:  10/31/2007  Job:  [id number]

## 2010-09-06 NOTE — Group Therapy Note (Signed)
NAMEARINA, TORRY            ACCOUNT NO.:  [id number]   MEDICAL RECORD NO.:  [id number]          PATIENT TYPE:  INP   LOCATION:  A318                          [facility]   PHYSICIAN:  Edward L. Juanetta Gosling, M.D.DATE OF BIRTH:  Dec 18, 1965   DATE OF PROCEDURE:  DATE OF DISCHARGE:                                 PROGRESS NOTE   SUBJECTIVE:  [name] is crying in her room.  Says that she is  having severe pain when she coughs.  She has not been able to cough  anything up.   PHYSICAL EXAMINATION:  VITAL SIGNS:  Temperature is 97.7, pulse 74,  respirations 15, blood pressure 155/82, and her O2 sat is 97% on room  air.  CHEST:  Clear.  GENERAL:  She looks uncomfortable.  LUNGS:  She does have some wheezing bilaterally.   ASSESSMENT:  She has got bronchitis.  No evidence of pneumonia at this  point.  She has got Tussionex and Tessalon ordered for cough.  I do not  think we can do a great deal differently better than that but she is  still not clearing her congestion.  I think I will go ahead and add some  Rocephin to the Levaquin, just to see if we get any better coverage and  if it makes any difference with her infection.      Edward L. Juanetta Gosling, M.D.  Electronically Signed     ELH/MEDQ  D:  11/01/2007  T:  11/01/2007  Job:  [id number]

## 2010-09-06 NOTE — Group Therapy Note (Signed)
NAMEALLANAH, MCFARLAND            ACCOUNT NO.:  [id number]   MEDICAL RECORD NO.:  [id number]          PATIENT TYPE:  INP   LOCATION:  A318                          FACILITY:  [facility]   PHYSICIAN:  Skeet Latch, DO    DATE OF BIRTH:  [birth year]   DATE OF PROCEDURE:  10/30/2007  DATE OF DISCHARGE:                                 PROGRESS NOTE   SUBJECTIVE:  The patient still is complaining of some shortness of  breath and cough.  The patient states that she is doing well in some  periods and then begins to have cough and has some chest discomfort  associated.  The patient is receiving breathing treatment on my  examination.   OBJECTIVE:  VITAL SIGNS:  Temperature 98.0, pulse 77, respirations 20,  blood pressure 114/63, and she is saturating 98% on room air.  HEENT:  Head is normocephalic and atraumatic.  No scleral icterus.  Oral  mucosa is moist.  LUNGS:  Diffuse rhonchi and expiratory wheezes.  HEART:  Regular rate and rhythm with no murmurs, rubs, or gallops.  ABDOMEN:  Obese, soft, nontender, and nondistended.  No rigidity or  guarding.  EXTREMITIES:  She has some pedal edema in bilateral lower extremities.   LABORATORY DATA:  Sodium 139, potassium 3.9, chloride 102, CO2 29,  glucose 178, BUN 12, creatinine 0.69.  White count 14,900, hemoglobin  9.8, hematocrit 31.7, and platelet count 314.   ASSESSMENT:  1. Acute bronchitis versus asthma.  For this we will continue with      pulmonary toilette.  The patient continues to be on nebulizer      treatments and we will give two doses of Solu-Medrol 125 mg and      then resume 60 mg q.8 hours.  We will add Mucinex and Tessalon      Perles to her daily regimen.  We will get a pulmonology consult      also at this time.  2. Nicotine abuse.  The patient is on a nicotine patch and this will      be continued at this time.  3. History of polysubstance abuse including marijuana and opiates.  4. I am unsure if there is an aspect of some  anxiety.  Also      associated.  We will start some Xanax t.i.d. p.r.n. low dose at      this time.  We will continue with deep venous thrombosis and      gastrointestinal prophylaxis.      Skeet Latch, DO  Electronically Signed     SM/MEDQ  D:  10/30/2007  T:  10/30/2007  Job:  [id number]

## 2010-09-06 NOTE — Group Therapy Note (Signed)
NAMEMARIETA, MARKOV            ACCOUNT NO.:  [id number]   MEDICAL RECORD NO.:  [id number]          PATIENT TYPE:  INP   LOCATION:  A318                          FACILITY:  [facility]   PHYSICIAN:  Skeet Latch, DO    DATE OF BIRTH:  Aug 28, 1965   DATE OF PROCEDURE:  DATE OF DISCHARGE:                                 PROGRESS NOTE   SUBJECTIVE:  [name] looks a lot better today.  The patient has  decreased shortness of breath and wheezing.  The patient states that she  is feeling better at this time.   OBJECTIVE:  VITAL SIGNS:  Temperature is 97.0, pulse 81, respirations  18, blood pressure 135/82, last O2 sats, she was sating 98%.  HEENT:  Head is normocephalic, atraumatic.  No scleral icterus.  Oral  mucosa is moist.  NECK:  Soft and supple.  Nontender and nondistended.  LUNGS:  Decreased rhonchi, still has end-expiratory wheezes noted.  HEART:  Regular rate and rhythm.  No murmurs, rubs or gallops.  ABDOMEN:  Obese, soft, nontender and nondistended.  No rigidity or  guarding.  EXTREMITIES:  Trace edema.  No clubbing or cyanosis.   LABORATORY DATA:  Sodium 142, potassium 4.1, chloride 102, CO2 30,  glucose 197, BUN 13, creatinine 0.75, white count 13.6, hemoglobin 3.0,  hematocrit 32.5, platelet count 325.   ASSESSMENT/PLAN:  1. Acute bronchitis versus chronic obstructive pulmonary disease.  I      will continue the patient with antibiotics.  We will start to try      to wean her IV steroids.  We will continue her Mucinex and Tessalon      Perles at this time.  I appreciate pulmonology input.  2. Nicotine abuse.  The patient continues to be on a nicotine patch.      Continue to counsel her to stop smoking.  3. The patient has a history of polysubstance abuse.  She continues to      need counseling.  4. Aspect of and anxiety.  The patient is on Xanax b.i.d. p.r.n.  She      seems to be stable at this time.  5. Lastly, continue deep venous thrombosis and gastrointestinal      prophylaxis.  The patient will need a PFT as outpatient per      pulmonology.  I anticipate the patient being discharged in the next      2 days.  She remains stable and continues to improve.  6. Lastly, the patient did have a hemoglobin A1c of 6.5.  Could be new      diagnosed diabetes.  The patient is on IV steroids.  This will      increase her blood sugars at this time.  The patient may need a      trial of dietary changes to see if it improves her A1c levels.  If      not, the patient may need to be started on oral hyperglycemics.      The patient will be started on Lantus 10 units subcu. daily while      she is on  the steroids.     Skeet Latch, DO  Electronically Signed    SM/MEDQ  D:  10/31/2007  T:  10/31/2007  Job:  [id number]

## 2010-09-06 NOTE — Discharge Summary (Signed)
[name], [name]            ACCOUNT NO.:  [id number]   MEDICAL RECORD NO.:  [id number]          PATIENT TYPE:  INP   LOCATION:  A318                          FACILITY:  [facility]   PHYSICIAN:  Skeet Latch, DO    DATE OF BIRTH:  1965-06-22   DATE OF ADMISSION:  10/28/2007  DATE OF DISCHARGE:  07/13/2009LH                               DISCHARGE SUMMARY   DISCHARGE DIAGNOSES:  1. Acute bronchitis versus asthma/chronic obstructive pulmonary      disease.  2. Leukocytosis pretty secondary to steroids.  3. Anxiety.  4. Obesity.  5. Hyperglycemia, possible new onset diabetes.   BRIEF HOSPITAL COURSE:  This is a 45 year old Caucasian female who  presented with shortness of breath and cough.  Apparently, the patient  has a history of tobacco abuse and has had multiple episodes of  bronchitis in the past who presented with a few day history of coughing,  but not productive.  The patient states she started noticing that she  was short of breath at rest as well as with exertion and started having  some wheeziness, lightheadedness, dizziness, and presyncopal episodes.  She also states she is getting chest tightness and sharp chest pains,  those are made worse by coughing and deep breathing.  The patient was  seen in the ER Department in [place], [place], 1 week prior.  She was  admitted today for evaluation.  She was discharged in October 26, 2007.  At  that time, she underwent a CT scan, echocardiogram, ultrasound of her  abdomen, and EKGs and says she may need a outpatient stress test.  She  was put on steroids, antibiotics on discharge, for a few days she felt  fine.  She began to have coughing and shortness of breath again and  presented to [facility]  In the emergency room, she received  multiple breathing treatments with minimal relief.  The patient was  subsequently admitted to [facility] with a diagnosis of acute  bronchitis versus asthma and COPD.  The patient  was started on IV  steroids nebulizing treatments as well as antibiotics.  The patient also  had a Pulmonology consult.  The patient had a slow progression of  improvement during her hospital stay despite steroids and nebulizer  treatments.  The patient finally began to feel better.  Her IV steroids  were subsequently switched to p.o. and antibiotics were switched to p.o.  also.  The patient was continued on antitussives also, which she states  really helps her.  At this time, I feel the patient can be discharged  home.   MEDICATIONS ON DISCHARGE:  1. Advair 250/50 mcg twice a day.  2. Tessalon Perles 200 mg three times a day as needed.  3. Xopenex 1.25 mg inhalations q. 6-8 h. as needed.  4. Omeprazole 20 mg daily.  5. Prednisone taper pack.  6. The patient will also be given a prescription for a nebulizer      machine.   VITALS ON DISCHARGE:  Temperature is 98.0, pulse 76, respirations 20,  blood pressure 106/63, and she is sating 92% on  room air.   Sodium 139, potassium 4.3, chloride 100, CO2 32, glucose 155, BUN 12,  creatinine 0.69, white count is 20.6, hemoglobin 10.6, hematocrit 34.5,  platelet count 306.   CONDITION ON DISCHARGE:  Stable.   DISPOSITION:  The patient will be discharged to home.   DISCHARGE INSTRUCTIONS:  The patient is to maintain a low-fat diet.  She  is to increase her activity slowly.  The patient is to receive names of  local physicians for appointment hopefully in the next 7-10 days.  The  patient is to follow up with Dr. Juanetta Gosling in the next 1 to 2 weeks for  probable PFT test.  Her primary care physician needs to follow up the  patient's blood sugars.  The patient will apparently need a hemoglobin  A1c to check to see if she truly is a diabetic and if she needs to be  started on therapy for diabetes.  I encouraged the patient that she  needs to lose weight and have a better diet.  Her white count could be  partly secondary to her steroids at this  time.  She apparently will need  a repeat CBC when she is seen by her primary care physician also.      Skeet Latch, DO  Electronically Signed     SM/MEDQ  D:  11/04/2007  T:  11/04/2007  Job:  [id number]

## 2010-09-06 NOTE — Group Therapy Note (Signed)
Sheila, [name]            ACCOUNT NO.:  [id number]   MEDICAL RECORD NO.:  [id number]          PATIENT TYPE:  INP   LOCATION:  A318                          FACILITY:  [facility]   PHYSICIAN:  Margaretmary Dys, M.D.DATE OF BIRTH:  [birth year]   DATE OF PROCEDURE:  10/29/2007  DATE OF DISCHARGE:                                 PROGRESS NOTE   SUBJECTIVE:  The patient was admitted last night with what appeared to  be bronchitis.  I suspect that the patient has possibly some asthma  versus early onset COPD.  The patient was advised to quit smoking.  The  patient says her breathing is slightly improved, continues to cough  although it is mostly dry cough.  She also wheezes.   OBJECTIVE:  Conscious, alert, comfortable, not in acute distress, obese  female.  VITAL SIGNS:  Blood pressure is 122/63, pulse of 79, respirations 18,  temperature 97.7 degrees Fahrenheit, oxygen saturation was 96% on 3 L.  HEENT:  Normocephalic, atraumatic.  Oral mucosa was moist.  No exudates  were noted.  NECK:  Supple.  No JVD or lymphadenopathy.  LUNGS:  Bilateral diffuse rhonchi with end-expiratory wheezes.  HEART:  S1 and S2 regular.  No S3, S4, gallops or rubs.  ABDOMEN:  Obese. but soft, nontender.  Bowel sounds positive.  No masses  palpable.  EXTREMITIES:  Bilateral pitting pedal edema.  CNS:  Grossly intact with no focal neurological deficits.   LABORATORY/DIAGNOSTIC DATA:  Unremarkable.   ASSESSMENT AND PLAN:  1. Acute bronchitis.  2. Possibly asthma with component of early emphysema.  3. History of nicotine abuse.  4. History of polysubstance abuse, including marijuana and opiates.   PLAN:  1. Will continue on nebulizations therapy.  2. Agree to continue on steroid therapy at this time.  3. I do not see any indication for antibiotic therapy, although the      patient is already on Levaquin.  I think we can possibly stop this      over the next 24 to 48 hours, unless the patient shows  other clear      signs of a bacterial infection.  4. Continue DVT prophylaxis with Lovenox and GI prophylaxis with      Protonix.   The patient will likely need pulmonary evaluation for a evidence of  reversible or irreversible airway disease, obstructive airway disease.      Margaretmary Dys, M.D.  Electronically Signed     AM/MEDQ  D:  10/29/2007  T:  10/29/2007  Job:  [id number]

## 2010-09-08 ENCOUNTER — Ambulatory Visit (INDEPENDENT_AMBULATORY_CARE_PROVIDER_SITE_OTHER): Payer: Self-pay | Admitting: Gastroenterology

## 2010-09-08 ENCOUNTER — Encounter: Payer: Self-pay | Admitting: Gastroenterology

## 2010-09-08 DIAGNOSIS — R1084 Generalized abdominal pain: Secondary | ICD-10-CM

## 2010-09-08 DIAGNOSIS — R197 Diarrhea, unspecified: Secondary | ICD-10-CM

## 2010-09-08 DIAGNOSIS — R1310 Dysphagia, unspecified: Secondary | ICD-10-CM

## 2010-09-08 DIAGNOSIS — K219 Gastro-esophageal reflux disease without esophagitis: Secondary | ICD-10-CM

## 2010-09-08 DIAGNOSIS — D509 Iron deficiency anemia, unspecified: Secondary | ICD-10-CM | POA: Insufficient documentation

## 2010-09-08 DIAGNOSIS — R1314 Dysphagia, pharyngoesophageal phase: Secondary | ICD-10-CM

## 2010-09-08 MED ORDER — PEG 3350-KCL-NA BICARB-NACL 420 G PO SOLR: ORAL | Status: AC

## 2010-09-08 NOTE — Progress Notes (Signed)
Cc to [facility] 

## 2010-09-08 NOTE — Assessment & Plan Note (Signed)
Recommend EGD with possible dilation. I have discussed the risks, alternatives, benefits with regards to but not limited to the risk of reaction to medication, bleeding, infection, perforation and the patient is agreeable to proceed. Written consent to be obtained.

## 2010-09-08 NOTE — Assessment & Plan Note (Signed)
As outlined above. Likely IBS plus or minus bile acid diarrhea. Cannot exclude celiac disease given the microcytic anemia. EGD and colonoscopy as planned.

## 2010-09-08 NOTE — Assessment & Plan Note (Signed)
One-month history of generalized abdominal pain associated with diarrhea, vomiting. 2 CT scans of the abdomen and pelvis were unremarkable except for fatty liver. No evidence of UTI. Recently diagnosed diabetes. She has had some postprandial loose stools ever since her gallbladder was removed in 1990. Suspect an element of IBS plus or minus bile acid diarrhea. She is ever had a colonoscopy therefore would recommend at this time including terminal ileoscopy. Would recommend upper endoscopy with small bowel biopsies to rule out celiac disease as well. I have discussed the risks, alternatives, benefits with regards to but not limited to the risk of reaction to medication, bleeding, infection, perforation and the patient is agreeable to proceed. Written consent to be obtained.

## 2010-09-08 NOTE — Progress Notes (Signed)
Primary Care Physician:  [facility] Dept.        Referring Physician:  Samuel Jester, [facility] ED  Primary Gastroenterologist:  Jonette Eva, MD  Chief Complaint  Patient presents with  . Abdominal Pain  . Diarrhea    HPI:  [name] is a 45 y.o. female here at the request of Dr. Clarene Duke with any pain hospital ED for further evaluation of abdominal pain. Patient is seen by [facility] health Department. She describes several month history of abdominal pain and diarrhea associated with nausea and vomiting. Pain is in the mid abdomen and constant. Worse with bowel movements. Worse with meals. Complains of postprandial loose stools. 3-5 bowel movements daily, loose yellow/orange. No melena or rectal bleeding. Rarely has a formed bowel movement. She complains of chronic heartburn she had her gallbladder removed in 1990. When she had insurance she used to take Prilosec. She's been uninsured for several years after losing her job. Recently started on it again by the health department. This seemed to help her heartburn. Complains of solid food dysphagia. Remote EGD, she had a hiatal hernia and findings typical for GERD per patient. No prior colonoscopy.  Recently started meds for DM by [facility]. Started on lisinopril for HTN too.  Seen at any [facility] ED twice for abdominal pain. First time was on April 3. CT showed fatty liver. Glucose is 148, hemoglobin 9.5, MCV 72.2. Urinalysis was negative. Urine pregnancy test negative. LFTs, lipase, creatinine all normal. Last week went back to [facility] for follow-up for recheck. While there got cramp in right side. EMS activated. Back to [facility] ED. Repeat CT without acute findings. White count normal again. LFTs and lipase normal. Urine pregnancy test negative. Hemoglobin 9.7, MCV 72.4.  Ever since GB removed, pp BMs if eats fast food but otherwise okay. Imodium prn.    Current Outpatient Prescriptions  Medication Sig Dispense Refill  .  lisinopril (PRINIVIL,ZESTRIL) 5 MG tablet Take 5 mg by mouth daily.        . metFORMIN (GLUCOPHAGE) 500 MG tablet Take 500 mg by mouth 2 (two) times daily with a meal.        . oxyCODONE-acetaminophen (PERCOCET) 5-325 MG per tablet Take 1 tablet by mouth every 4 (four) hours as needed.        . ranitidine (ZANTAC) 150 MG tablet Take 150 mg by mouth 2 (two) times daily.          Allergies as of 09/08/2010 - Review Complete 09/08/2010  Allergen Reaction Noted  . Codeine  09/01/2010    Past Medical History  Diagnosis Date  . Diabetes mellitus   . COPD (chronic obstructive pulmonary disease)   . HTN (hypertension)   . Low back pain   . Tachycardia     never had test done since no insurance  . Depression     Past Surgical History  Procedure Date  . Cholecystectomy 1990  . Cesarean section     twice  . Kidney surgery     as child for blockages  . Tubal ligation   . Tonsillectomy     Family History  Problem Relation Age of Onset  . Heart attack Father 66    deceased, etoh use  . Heart attack Mother 23    deceased  . Diabetes Mother   . Breast cancer Mother   . Heart failure Mother     oxygen dependence, nonsmoker  . Colon cancer Neg Hx   . Liver disease Maternal  Aunt 40    died while on liver transplant list  . Heart attack Maternal Grandmother     premature CAD  . Ulcers Sister     History   Social History  . Marital Status: Single    Spouse Name: N/A    Number of Children: 2  . Years of Education: N/A   Occupational History  . unemployed    Social History Main Topics  . Smoking status: Current Everyday Smoker -- 1.0 packs/day for 30 years    Types: Cigarettes  . Smokeless tobacco: Not on file  . Alcohol Use: No  . Drug Use: No  . Sexually Active: Not on file     ROS:  General: Negative for anorexia, unintentional weight loss, fever, chills, fatigue, weakness. Eyes: Negative for vision changes.  ENT: Negative for hoarseness, difficulty swallowing ,  nasal congestion. CV: Negative for chest pain, angina, palpitations, dyspnea on exertion, peripheral edema.  Respiratory: Negative for dyspnea at rest, dyspnea on exertion. Positive for cough and wheezing.  GI: See history of present illness. GU:  Negative for dysuria, hematuria, urinary incontinence, urinary frequency, nocturnal urination.  MS: Positive for joint pain, low back pain.  Derm: Negative for rash or itching.  Neuro: Negative for weakness, abnormal sensation, seizure, frequent headaches, memory loss, confusion.  Psych: Negative for anxiety, suicidal ideation, hallucinations. Positive for depression. Endo: Negative for unusual weight change.  Heme: Negative for bruising or bleeding. Allergy: Negative for rash or hives.    Physical Examination:  BP 118/64  Pulse 110  Temp(Src) 97.8 F (36.6 C) (Temporal)  Ht 5\' 4"  (1.626 m)  Wt 286 lb 6.4 oz (129.91 kg)  BMI 49.16 kg/m2  LMP 08/23/2010   General: Well-nourished, well-developed in no acute distress. Obese. Head: Normocephalic, atraumatic.   Eyes: Conjunctiva pink, no icterus. Mouth: Oropharyngeal mucosa moist and pink , no lesions erythema or exudate. Multiple missing teeth. Neck: Supple without thyromegaly, masses, or lymphadenopathy.  Lungs: Clear to auscultation bilaterally.  Heart: Regular rate and rhythm, no murmurs rubs or gallops.  Abdomen: Bowel sounds are normal, obese. Diffuse tenderness to light palpation. No hepatosplenomegaly or masses, no abdominal bruits or    hernia , no rebound or guarding.   Extremities: No lower extremity edema.  Neuro: Alert and oriented x 4 , grossly normal neurologically.  Skin: Warm and dry, no rash or jaundice.   Psych: Alert and cooperative, normal mood and affect.

## 2010-09-08 NOTE — Assessment & Plan Note (Signed)
Chronic heartburn. Better on ranitidine. History of hiatal hernia the EGD in the remote past. May need to be on PPI at some point right now symptoms are improved.

## 2010-09-08 NOTE — Assessment & Plan Note (Signed)
Microcytic anemia. No overt GI bleeding. Consider small bowel biopsy at time of EGD to exclude celiac disease. Patient does continue to have menses, therefore may in part explain her microcytic anemia

## 2010-09-09 NOTE — Discharge Summary (Signed)
[name], [name]            ACCOUNT NO.:  [id number]   MEDICAL RECORD NO.:  [id number]          PATIENT TYPE:  INP   LOCATION:  A321                          FACILITY:  [facility]   PHYSICIAN:  Vania Rea, M.D. DATE OF BIRTH:  12-Jan-1966   DATE OF ADMISSION:  02/15/2005  DATE OF DISCHARGE:  10/30/2006LH                                 DISCHARGE SUMMARY   PRIMARY CARE PHYSICIAN:  Unassigned.  Assigned to Dr. Catalina Pizza.   DISCHARGE DIAGNOSES:  1.  Acute exacerbation of chronic obstructive pulmonary disease.  2.  Nicotine abuse.  3.  Morbid obesity.  4.  Probable obstructive sleep apnea.  5.  Newly diagnosed diabetes.   DISPOSITION:  Discharged to home.   CONDITION ON DISCHARGE:  Stable.   DISCHARGE MEDICATIONS:  1.  Advair 250/50 mcg 1 puff twice daily.  2.  Glucophage 500 mg twice daily.  3.  Paxil 20 mg daily.  4.  Levaquin 750 mg daily for further 3 days.  5.  Xopenex by nebulizer at 1.25 mg 4 times daily when necessary.  6.  Atrovent nebulizer 0.5 mg 4 times daily when necessary.  7.  Mucinex 1200 mg twice daily for 5 days.  8.  Tylenol 1 gm every 6 hours when necessary.   HOSPITAL COURSE:  Please refer to the admission history and physical  dictated October 25th.  This is a morbidly obese lady who has been having no  medical problems admitted with acute shortness of breath, thought to be  having an acute exacerbation of COPD/bronchitis by physical exam as well as  by chest x-ray, and was also noted to be hyperglycemic with a hemoglobin A1c  of 6.8 on admission.  The patient was treated with serial nebulizations.  She did receive intravenous and oral steroids, which increased her glucose  further, and she required insulin supplementation to control her blood  sugars.  This was later transitioned to Glucophage as she was being prepared  for discharge.   The patient also complained of a history of panic attacks and did have  episodes of severe anxiety while in the  hospital.  She was, therefore,  nebulized with Xopenex rather than albuterol, and she was started on Paxil  by the admitting physician.   The patient's progress in the hospital was fairly slow.  Although the record  indicates that a couple of years ago, she managed to achieve a peak flow in  the 300s at the end of her hospitalization, we could not get her peak flow  above 250 during this hospitalization.  However, the patient is considered  stable for discharge, although she is saying she does not feel quite up to  75% of her baseline.   The patient gives a history of snoring and waking up frequently at night,  feeling anxious and panicky, and does seem to have the symptoms and the body  habitus of one with obstructive sleep apnea.  She is being referred to Dr.  Gerilyn Pilgrim for sleep studies and further management.   PHYSICAL EXAMINATION:  GENERAL APPEARANCE:  This morning, the patient is  alert and  oriented.  She is able to ambulate in the hall of the hospital  without difficulty.  VITAL SIGNS:  Her temperature is 98.3, pulse 74, respirations 18, blood  pressure 139/80.  Her fasting blood sugar this morning is 130.  Her O2 sat  is 98% on room air.  RESPIRATORY:  She has prolonged expiration bilaterally and mild rhonchi.  EXTREMITIES:  She has no lower extremity edema.   LABORATORY:  WBC 17.4, Hb 11.0, Plats 375,  BMet unremarkable, fasting blood  sugar 173, Fasting lipid panel: tot chol 146, LDL 86, trig 104. TSH nl.    FOLLOW UP:  Dr. Dwana Melena, and dr Beryle Beams in 2 weeks.      Vania Rea, M.D.  Electronically Signed     LC/MEDQ  D:  02/21/2005  T:  02/22/2005  Job:  [id number]

## 2010-09-09 NOTE — Discharge Summary (Signed)
NAME:  [name], [name]                      ACCOUNT NO.:  [id number]   MEDICAL RECORD NO.:  [id number]                   PATIENT TYPE:  INP   LOCATION:  3023                                 FACILITY:  [facility]   PHYSICIAN:  Barney Drain, M.D.                 DATE OF BIRTH:  1965-05-26   DATE OF ADMISSION:  09/27/2002  DATE OF DISCHARGE:  09/30/2002                                 DISCHARGE SUMMARY   DISCHARGE DIAGNOSES:  1. Acute asthma exacerbation.  2. Tobacco abuse.  3. Iron-deficiency anemia.   DISCHARGE MEDICATIONS:  1. Prednisone 20 mg 2 tabs p.o. q.d. x6 days.  2. Combivent inhaler 2 puffs q.i.d.  3. Flovent 2 puffs b.i.d.  4. Nicotine patch 21 mg 1 patch transdermal q.d.  5. Xanax 0.5 mg 1 tab p.o. every 6 hours p.r.n. only for anxiety related to     difficulty in breathing.   SPECIAL INSTRUCTIONS:  The patient is to check and record a peak flow every  morning.   DISPOSITION:  The patient was discharged home in stable condition.   FOLLOW UP:  1. he patient to call Dr. Loreta Ave at [facility] today for follow up     appointment.  2. Pulmonary function tests scheduled at [facility] on October 08, 2002 at 1400. The patient to arrive at 1330 to check in at admitting.   HISTORY OF PRESENT ILLNESS:  [name] is a 45 year old female with a 30  pack year smoking history who has had six months of shortness of breath and  frequent bronchitis diagnoses, who presented with severe shortness of  breath. She was seen by her primary care physician on September 23, 2002 and  diagnosed with bronchitis. Started on Prednisone and Biaxin. She returned to  the clinic on September 25, 2002 and her regimen was switched to Levaquin and  received a corticosteroid shot. The patient had wheezing, dry cough, post-  tussive emesis, chest tightness, dyspnea, anxiety, light headedness and  nausea at the time of presentation.   PHYSICAL EXAMINATION:  VITAL SIGNS: Temperature 97.1,  blood pressure 154/92,  respiratory rate 32, O2 sat 99%.  GENERAL: An elderly anxious female.  HEENT: Oropharynx was clear. She had poor dentition.  NECK: Supple. No lymphadenopathy appreciated. The patient was tachycardiac.  LUNGS: Had coarse wheezing throughout. Poor air movement. The patient was  speaking in short sentences. No clear accessory muscle use. No retractions.  There was an audible end-expiratory wheeze.  ABDOMEN: Soft and nontender.   LABORATORY DATA:  Pertinent laboratory findings included an ABG on room air  with pH of 7.542, pCO2 at 31.6, pO2 at 56 and bicarb of 27.   DIAGNOSTIC IMPRESSION:  Chest x-ray showed peribronchial thickening air  space disease. No infiltrate.   HOSPITAL COURSE:  Problem 1. Asthma. The patient received Albuterol and  Atrovent nebulizer treatments, Prednisone burst and Azithromycin throughout  her hospitalization. She was gradually weaned off of her supplemental  oxygen. At the time of admission, her pre-treatment peak flow was 129 and  post treatment peak flow was 125 at the time of discharge. Her pre treatment  peak flow 320 and her post treatment peak flow was then same. It was felt  that her respiratory disease had multiple components including possible  obstructive sleep apnea and the patient has significant truncal obesity and  reports a great deal of snoring at night and frequently waking up shortness  of breath.   Problem 2. She has a 30 pack year smoking history. She has seasonal  allergies. She has been working in a [employer] education [employer] that has very strong odors  and residue and she notices that she gets shortness of breath when she has  to walk through the plant. And she describes symptoms consistent with  childhood asthma. It was felt that the best way to determine if there is a  reactive airway component to patient's disease is to check peak flows and  document them for review by her primary care physician to see if there is a   variance to her symptoms. Pulmonary function tests will be the most  important objective data to make a clear diagnosis. These tests were  scheduled for the patient prior to her discharge. Of note, I believe that  there is a significant anxiety component to the patient's shortness of  breath. The patient reported subjective improvement with Xanax during the  worst of her difficulty breathing episodes during this hospitalization.   Problem 3. Tobacco abuse. The patient was counseled extensively on smoking  cessation.  A smoking cessation counselor worked with the patient during  this admission. The patient was started on a 21 mg Nicotine trans-dermal  patch for six weeks. This patch will need to be weaned down to 14 mg for an  additional two weeks and then 7 mg for two weeks.   Problem 4. Iron-deficiency anemia. On CBC, the patient was noted to have a  microcytic anemia. Hemoglobin was 10. MCV was 76. Iron studies showed total  iron was low at 18. Total iron binding capacity was 325. Percent iron  saturation was 6% and Ferritin was 43%. Iron sulfate was begun. The patient  will need follow up as an outpatient to document improvement of anemia.   Problem 5. Social Issues. Social work worked with the patient to pursue  [employer] application.                                                 Barney Drain, M.D.    SS/MEDQ  D:  09/30/2002  T:  09/30/2002  Job:  [id number]   cc:   Leighton Roach McDiarmid, M.D.  [address] [place]  [place] [address]  Fax: [phone]   [facility]  [place], [place].

## 2010-09-09 NOTE — H&P (Signed)
[name], [name]            ACCOUNT NO.:  [id number]   MEDICAL RECORD NO.:  [id number]          PATIENT TYPE:  OBV   LOCATION:  A321                          FACILITY:  [facility]   PHYSICIAN:  Osvaldo Shipper, MD     DATE OF BIRTH:  Aug 19, 1965   DATE OF ADMISSION:  02/15/2005  DATE OF DISCHARGE:  LH                                HISTORY & PHYSICAL   The patient does not have a primary medical doctor.   ADMITTING DIAGNOSES:  1.  Acute bronchitis.  2.  Hyperglycemia.  3.  Obesity.  4.  Nicotine abuse.   CHIEF COMPLAINT:  Shortness of breath and cough for one week.   HISTORY OF PRESENT ILLNESS:  The patient is a 45 year old Caucasian female  with a past medical history of chronic nicotine use, obesity, depression,  and prior episodes of bronchitis in the past who presents to the hospital  today with shortness of breath and cough for the past one week.  The patient  mentioned that she was doing well till about a week ago when she started  having cold symptoms with sinus pain and ear drainage.  This was followed by  cough and shortness of breath initially with exertion, subsequently at rest.  On Monday, the patient went to an urgent care center where she was  prescribed Biaxin and prednisone pack after being diagnosed as acute  bronchitis.  The patient mentions she initially responded well to these  medications, however, this morning her symptoms got worse and she got a call  also from the urgent care doctor stating that her x-ray was showing early  emphysematous changes.  The doctor asked [place] to discontinue the Biaxin and  was prescribed another antibiotic which she does not know.  However, the  patient has not filled that prescription, continued to feel unwell, and  decided to come into the ED here.   The patient mentions her cough started about seven days ago without any  expectoration.  The patient has experienced some chills and temperature up  to 100 but nothing more than  that.  The patient mentioned that her sister  and son have also been unwell for the past few days.  There is no history of  any recent travel outside this region.  The patient has chronic ear problems  and chronic ear drainage and she has seen an ENT doctor in the past but not  in a long time.  The patient mentioned some discomfort around her rib cage  and her chest but this episode started after her other symptoms started.  She says the chest discomfort increases with her cough and with taking deep  breaths.   MEDICATIONS AT HOME:  Apart from the Biaxin and prednisone, she was  prescribed ear drops by the urgent care physician.  She is supposed to be on  Celexa and trazodone for her depression, however, she has not been taking  them for a month.   ALLERGIES:  CODEINE which causes GI upset.   PAST MEDICAL HISTORY:  Unremarkable except for:  1.  Obesity.  2.  Episodes of  bronchitis a few times in the past two years.  3.  History of depression as well.  4.  She also has been diagnosed with a hiatal hernia.  5.  She has had a C-section.  6.  Cholecystectomy.  7.  Tonsillectomy in the past as an adult.  8.  She has had some surgery to her left kidney as a child, possibly      placement of a nephrostomy tube for obstruction.  9.  Back in July, the patient mentioned she was admitted for a drug      overdose.  Currently, the patient is denying any suicidal or homicidal      thoughts.   SOCIAL HISTORY:  The patient lives in [place] with her sister, her  children, and her sister's children.  She smokes about a pack and half of  cigarettes per day and has about 38-year pack history of smoking.  No  alcohol use at this time.  She used to drink in the past.  She did marijuana  about a week ago but no cocaine use.   FAMILY HISTORY:  Father died of heart attack at age 74.  Mother also had  heart disease, CHF, diabetes, hypertension, breast cancer and died of a  heart attack at the age of  73.  Other family members have diagnoses of  glaucoma and hypertension as well.   REVIEW OF SYSTEMS:  Except as mentioned in the HPI, additional history  includes hearing loss on the left side because of chronic ear problems.   PHYSICAL EXAMINATION:  VITAL SIGNS:  Temperature is 98.9, blood pressure is  152/81, heart rate initially 120 currently about 110, saturation about 92%  on room air.  Currently the patient is on O2.  GENERAL:  This is an obese female in slight discomfort because of wheezing.  HEENT:  There is no pallor, no icterus.  Oral mucous membranes are moist.  There is clear drainage seen in both ears.  The left ear's tympanic membrane  appears to be ruptured.  NECK:  Soft and supple.  LUNGS:  Reveal moderate end expiratory wheezing bilaterally.  No crackles  are heard.  CARDIOVASCULAR:  S1 S2 is normal regular.  I do not appreciate any murmur at  this time.  There is no JVD.  ABDOMEN:  Obese, soft, nontender, nondistended.  Bowel sounds are present.  CHEST:  Wall is slightly tender to palpation and it produces her discomfort.  EXTREMITIES:  Without edema.  Peripheral pulses are palpable.   LABORATORY DATA:  CBC which showed a white count of 14.7, hemoglobin 10.8,  platelet count 377.  Differential on the white count showed 87% neutrophils.  Other smear data is not available at this time.   BMP showed potassium of 3.4, sodium 133, chloride 103, bicarb 21, BUN 8,  creatinine 0.9, calcium 8.1, glucose is elevated at 222.   ABG was done which showed pH 7.44, pCO2 31, pO2 62, saturation 92%, bicarb  20.   Chest x-ray was done which does not show any acute process such as  pneumonia.  Heart size appears to be normal.   IMPRESSION:  This is a 45 year old Caucasian female with no significant past  medical history except periods of bronchitis who has never been told that she has asthma.  She said that she has had lung function tests done in the  past.  She presents to the  hospital with shortness of breath, cough, appears  to have acute episode of  bronchitis.  The patient is a smoker and this  probably predisposes her to having such episodes.   PLAN:  1.  Acute bronchitis/chronic obstructive pulmonary disease exacerbation.  We      will observe the patient in the hospital.  Give her nebulizer treatment      every three hours for now.  We will give her IV Solu-Medrol, cover her      with antibiotics.  I will also start the patient on Advair Diskus.  We      will give her some Tussionex for cough suppressant.  Hopefully in the      next day or so, the patient's breathing should improve and we will be      able to send her home.  The patient will probably need another pulmonary      function test after she is back to her baseline.   1.  Hyperglycemia.  It is likely secondary to her recent steroid use.  The      patient does not recall having diabetes, although she mentioned that she      has not seen a physician in a while.  I will cover her with Lantus for      now and monitor her CBGs q.a.c. and h.s.  Her sugars are going to get      worse as she is going to be on steroids now.   1.  Leukocytosis also likely secondary to steroid use.  The patient is      currently afebrile.  There is no infectious process that I can detect at      this time.  We will check a urine for UA, chest x-ray is already      negative.   1.  Polysubstance abuse.  The patient smokes cigarettes on a daily basis.      She also smokes marijuana occasionally.  I will have the social worker      come and talk to her.  For smoking, we will put her on nicotine patch      for now.  The patient thinks that she will be ready to quit smoking soon      but currently she is not very motivated.      Osvaldo Shipper, MD  Electronically Signed     GK/MEDQ  D:  02/15/2005  T:  02/15/2005  Job:  [id number]

## 2010-09-16 ENCOUNTER — Encounter: Payer: Self-pay | Admitting: Gastroenterology

## 2010-09-16 ENCOUNTER — Other Ambulatory Visit: Payer: Self-pay | Admitting: Gastroenterology

## 2010-09-16 ENCOUNTER — Ambulatory Visit (HOSPITAL_COMMUNITY)
Admission: RE | Admit: 2010-09-16 | Discharge: 2010-09-16 | Disposition: A | Discharge status: Home / Self Care | Payer: Self-pay | Source: Ambulatory Visit | Attending: Gastroenterology | Admitting: Gastroenterology

## 2010-09-16 DIAGNOSIS — R1311 Dysphagia, oral phase: Secondary | ICD-10-CM

## 2010-09-16 DIAGNOSIS — R197 Diarrhea, unspecified: Secondary | ICD-10-CM | POA: Insufficient documentation

## 2010-09-16 DIAGNOSIS — K648 Other hemorrhoids: Secondary | ICD-10-CM | POA: Insufficient documentation

## 2010-09-16 DIAGNOSIS — J449 Chronic obstructive pulmonary disease, unspecified: Secondary | ICD-10-CM | POA: Insufficient documentation

## 2010-09-16 DIAGNOSIS — R109 Unspecified abdominal pain: Secondary | ICD-10-CM

## 2010-09-16 DIAGNOSIS — K219 Gastro-esophageal reflux disease without esophagitis: Secondary | ICD-10-CM

## 2010-09-16 DIAGNOSIS — D649 Anemia, unspecified: Secondary | ICD-10-CM

## 2010-09-16 DIAGNOSIS — K2940 Chronic atrophic gastritis without bleeding: Secondary | ICD-10-CM | POA: Insufficient documentation

## 2010-09-16 DIAGNOSIS — Z6841 Body Mass Index (BMI) 40.0 and over, adult: Secondary | ICD-10-CM | POA: Insufficient documentation

## 2010-09-16 DIAGNOSIS — E119 Type 2 diabetes mellitus without complications: Secondary | ICD-10-CM | POA: Insufficient documentation

## 2010-09-16 DIAGNOSIS — Z79899 Other long term (current) drug therapy: Secondary | ICD-10-CM | POA: Insufficient documentation

## 2010-09-16 DIAGNOSIS — I10 Essential (primary) hypertension: Secondary | ICD-10-CM | POA: Insufficient documentation

## 2010-09-16 DIAGNOSIS — R1310 Dysphagia, unspecified: Secondary | ICD-10-CM | POA: Insufficient documentation

## 2010-09-16 DIAGNOSIS — J4489 Other specified chronic obstructive pulmonary disease: Secondary | ICD-10-CM | POA: Insufficient documentation

## 2010-09-16 HISTORY — PX: ESOPHAGOGASTRODUODENOSCOPY: SHX1529

## 2010-09-16 HISTORY — PX: COLONOSCOPY: SHX174

## 2010-09-16 LAB — GLUCOSE, CAPILLARY: Glucose-Capillary: 149 mg/dL — ABNORMAL HIGH (ref 70–99)

## 2010-09-23 NOTE — Progress Notes (Signed)
agree

## 2010-09-27 ENCOUNTER — Encounter: Payer: Self-pay | Admitting: Gastroenterology

## 2010-10-19 NOTE — Op Note (Signed)
[name], [name]            ACCOUNT NO.:  [id number]  MEDICAL RECORD NO.:  [id number]           PATIENT TYPE:  O  LOCATION:  DAYP                          FACILITY:  [facility]  PHYSICIAN:  Jonette Eva, M.D.     DATE OF BIRTH:  1965/08/21  DATE OF PROCEDURE:  09/16/2010 DATE OF DISCHARGE:                              OPERATIVE REPORT   REFERRING PHYSICIAN:  [facility].  PROCEDURES: 1. Colonoscopy with random cold forceps biopsy of the colon. 2. Esophagogastroduodenoscopy with cold forceps biopsy of gastric and     duodenal mucosa.  INDICATION FOR EXAM:  [name] is a 45 year old female who presents with abdominal pain and diarrhea.  She has uncontrolled reflux symptoms on ranitidine.  She does not have insurance.  She has gained weight over the last 1-2 years.  She currently weighs 286 pounds and her body mass index is 49.16.  She chronically takes Percocet.  She has heavy menses for 5-6 days every month.  She uses ibuprofen every 3 hours.  She has heavy bleeding for 5 days.  She was also noted to have a hemoglobin of 9.5 with MCV of 72.2 in the emergency department.  She complained of solid dysphasia.  She denied any black tarry stools or rectal bleeding.  FINDINGS: 1. Normal colon without evidence of polyps, masses, inflammatory     changes, diverticula, or AVMs.  Random biopsy was taken to evaluate     for microscopic colitis as an etiology for diarrhea. 2. Small internal hemorrhoids.  Otherwise, normal retroflexed view of     the rectum. 3. Normal esophagus without evidence of Barrett mass, erosion,     ulceration, or stricture. 4. Patchy erythema with occasional erosions, scattered throughout the     stomach.  Biopsy was obtained via cold forceps to evaluate for H.     pylori gastritis. 5. Normal duodenal bulb, ampulla, second portion of the duodenum.     Biopsy was obtained via cold forceps to evaluate for celiac sprue     as an etiology  for her diarrhea and/or microcytic anemia.  DIAGNOSES: 1. Abdominal pain and diarrhea, most likely secondary to irritable     bowel syndrome with a bile salt component.  The differential     diagnosis includes celiac sprue, microscopic colitis, and diabetic     enteropathy. 2. Mild gastritis, likely secondary to NSAID use.  RECOMMENDATIONS: 1. She should begin Prilosec 1 p.o. 30 minutes prior to her first     meal.  She may use Zantac as needed. 2. We will call with the results of her biopsies. 3. Screening colonoscopy in 10 years. 4. She should follow a high-fiber diet.  She was given a handout on     high-fiber diet, hemorrhoids, and gastritis. 5. Followup appointment in 3 months regarding her pain and diarrhea. 6. Add [facility] probiotic daily. 7. Add calcium 2 tablets with meals to address the bile salt component     of her diarrhea.  MEDICATIONS: 1. Demerol 125 mg IV. 2. Versed 6 mg IV.  PROCEDURE TECHNIQUE:  Physical exam was performed.  Informed consent was obtained  from the patient after explaining benefits, risks, and alternatives to the procedure.  The patient was connected to the monitor and placed in the left lateral position.  Continuous oxygen was provided by nasal cannula and IV medicine was administered through an indwelling cannula.  After administration of sedation and rectal exam, the patient's rectum was intubated and the scope was advanced under direct visualization to the cecum.  The scope was removed slowly by carefully examining the color, texture, anatomy, and integrity of the mucosa on the way out.  After colonoscopy, the patient's esophagus was intubated with the diagnostic gastroscope and the scope was advanced under direct visualization to the second portion of the duodenum.  The scope was removed slowly by carefully examining the color, texture, anatomy, and integrity of the mucosa on the way out.  The patient was recovered in endoscopy and  discharged home in satisfactory condition.  PATH: MILD GASTRITIS, NL DUODENUM & COLON   Jonette Eva, M.D.     SF/MEDQ  D:  09/16/2010  T:  09/16/2010  Job:  [id number]  cc:   [facility]  Electronically Signed by Jonette Eva M.D. on 10/19/2010 01:46:15 PM

## 2010-11-30 ENCOUNTER — Inpatient Hospital Stay (HOSPITAL_COMMUNITY)
Admission: EM | Admit: 2010-11-30 | Discharge: 2010-12-07 | DRG: 189 | Disposition: A | Discharge status: Home / Self Care | Payer: Medicaid Other | Attending: Internal Medicine | Admitting: Internal Medicine

## 2010-11-30 ENCOUNTER — Emergency Department (HOSPITAL_COMMUNITY): Payer: Medicaid Other

## 2010-11-30 ENCOUNTER — Inpatient Hospital Stay (HOSPITAL_COMMUNITY): Payer: Medicaid Other

## 2010-11-30 ENCOUNTER — Other Ambulatory Visit: Payer: Self-pay

## 2010-11-30 ENCOUNTER — Encounter (HOSPITAL_COMMUNITY): Payer: Self-pay

## 2010-11-30 DIAGNOSIS — J440 Chronic obstructive pulmonary disease with acute lower respiratory infection: Secondary | ICD-10-CM | POA: Diagnosis present

## 2010-11-30 DIAGNOSIS — J441 Chronic obstructive pulmonary disease with (acute) exacerbation: Secondary | ICD-10-CM | POA: Diagnosis present

## 2010-11-30 DIAGNOSIS — I10 Essential (primary) hypertension: Secondary | ICD-10-CM | POA: Diagnosis present

## 2010-11-30 DIAGNOSIS — F17210 Nicotine dependence, cigarettes, uncomplicated: Secondary | ICD-10-CM | POA: Diagnosis present

## 2010-11-30 DIAGNOSIS — K259 Gastric ulcer, unspecified as acute or chronic, without hemorrhage or perforation: Secondary | ICD-10-CM | POA: Diagnosis present

## 2010-11-30 DIAGNOSIS — J209 Acute bronchitis, unspecified: Secondary | ICD-10-CM | POA: Diagnosis present

## 2010-11-30 DIAGNOSIS — R0902 Hypoxemia: Secondary | ICD-10-CM | POA: Diagnosis present

## 2010-11-30 DIAGNOSIS — D72829 Elevated white blood cell count, unspecified: Secondary | ICD-10-CM | POA: Diagnosis present

## 2010-11-30 DIAGNOSIS — J019 Acute sinusitis, unspecified: Secondary | ICD-10-CM | POA: Diagnosis present

## 2010-11-30 DIAGNOSIS — E1142 Type 2 diabetes mellitus with diabetic polyneuropathy: Secondary | ICD-10-CM | POA: Diagnosis present

## 2010-11-30 DIAGNOSIS — J9600 Acute respiratory failure, unspecified whether with hypoxia or hypercapnia: Principal | ICD-10-CM | POA: Diagnosis present

## 2010-11-30 DIAGNOSIS — E119 Type 2 diabetes mellitus without complications: Secondary | ICD-10-CM | POA: Diagnosis present

## 2010-11-30 DIAGNOSIS — J9691 Respiratory failure, unspecified with hypoxia: Secondary | ICD-10-CM | POA: Diagnosis present

## 2010-11-30 DIAGNOSIS — IMO0001 Reserved for inherently not codable concepts without codable children: Secondary | ICD-10-CM | POA: Diagnosis present

## 2010-11-30 DIAGNOSIS — D509 Iron deficiency anemia, unspecified: Secondary | ICD-10-CM | POA: Diagnosis present

## 2010-11-30 DIAGNOSIS — E876 Hypokalemia: Secondary | ICD-10-CM | POA: Diagnosis present

## 2010-11-30 DIAGNOSIS — E1159 Type 2 diabetes mellitus with other circulatory complications: Secondary | ICD-10-CM | POA: Diagnosis present

## 2010-11-30 HISTORY — DX: Anxiety disorder, unspecified: F41.9

## 2010-11-30 HISTORY — DX: Other hemorrhoids: K64.8

## 2010-11-30 HISTORY — DX: Gastric ulcer, unspecified as acute or chronic, without hemorrhage or perforation: K25.9

## 2010-11-30 HISTORY — DX: Shortness of breath: R06.02

## 2010-11-30 HISTORY — DX: Excessive and frequent menstruation with regular cycle: N92.0

## 2010-11-30 LAB — URINALYSIS, ROUTINE W REFLEX MICROSCOPIC
Bilirubin Urine: NEGATIVE
Glucose, UA: 250 mg/dL — AB
Ketones, ur: NEGATIVE mg/dL
Leukocytes, UA: NEGATIVE
Nitrite: NEGATIVE
Protein, ur: NEGATIVE mg/dL
Specific Gravity, Urine: <1.005 — ABNORMAL LOW (ref 1.005–1.030)
Urobilinogen, UA: 0.2 mg/dL (ref 0.0–1.0)
pH: 6 (ref 5.0–8.0)

## 2010-11-30 LAB — URINE MICROSCOPIC-ADD ON

## 2010-11-30 LAB — BASIC METABOLIC PANEL
BUN: 6 mg/dL (ref 6–23)
CO2: 24 mEq/L (ref 19–32)
Calcium: 9.1 mg/dL (ref 8.4–10.5)
Chloride: 97 mEq/L (ref 96–112)
Creatinine, Ser: 0.58 mg/dL (ref 0.50–1.10)
GFR calc Af Amer: >60 mL/min (ref >60)
GFR calc non Af Amer: >60 mL/min (ref >60)
Glucose, Bld: 147 mg/dL — ABNORMAL HIGH (ref 70–99)
Potassium: 3.4 mEq/L — ABNORMAL LOW (ref 3.5–5.1)
Sodium: 135 mEq/L (ref 135–145)

## 2010-11-30 LAB — DIFFERENTIAL
Basophils Absolute: 0 10*3/uL (ref 0.0–0.1)
Basophils Relative: 0 % (ref 0–1)
Eosinophils Absolute: 0.1 10*3/uL (ref 0.0–0.7)
Eosinophils Relative: 1 % (ref 0–5)
Lymphocytes Relative: 19 % (ref 12–46)
Lymphs Abs: 2.8 10*3/uL (ref 0.7–4.0)
Monocytes Absolute: 1 10*3/uL (ref 0.1–1.0)
Monocytes Relative: 7 % (ref 3–12)
Neutro Abs: 10.7 10*3/uL — ABNORMAL HIGH (ref 1.7–7.7)
Neutrophils Relative %: 73 % (ref 43–77)

## 2010-11-30 LAB — CBC
HCT: 31.5 % — ABNORMAL LOW (ref 36.0–46.0)
Hemoglobin: 9.3 g/dL — ABNORMAL LOW (ref 12.0–15.0)
MCH: 20.7 pg — ABNORMAL LOW (ref 26.0–34.0)
MCHC: 29.5 g/dL — ABNORMAL LOW (ref 30.0–36.0)
MCV: 70 fL — ABNORMAL LOW (ref 78.0–100.0)
Platelets: 347 10*3/uL (ref 150–400)
RBC: 4.5 MIL/uL (ref 3.87–5.11)
RDW: 18.8 % — ABNORMAL HIGH (ref 11.5–15.5)
WBC: 14.7 10*3/uL — ABNORMAL HIGH (ref 4.0–10.5)

## 2010-11-30 LAB — GLUCOSE, CAPILLARY
Glucose-Capillary: 133 mg/dL — ABNORMAL HIGH (ref 70–99)
Glucose-Capillary: 262 mg/dL — ABNORMAL HIGH (ref 70–99)

## 2010-11-30 MED ORDER — ALBUTEROL SULFATE (5 MG/ML) 0.5% IN NEBU
2.5000 mg | INHALATION_SOLUTION | RESPIRATORY_TRACT | Status: DC
  Administered 2010-11-30 – 2010-12-07 (×37): 2.5 mg via RESPIRATORY_TRACT
  Filled 2010-11-30 (×37): qty 0.5

## 2010-11-30 MED ORDER — GUAIFENESIN ER 600 MG PO TB12
1200.0000 mg | ORAL_TABLET | Freq: Two times a day (BID) | ORAL | Status: DC
  Administered 2010-11-30 – 2010-12-07 (×14): 1200 mg via ORAL
  Filled 2010-11-30: qty 2
  Filled 2010-11-30: qty 1
  Filled 2010-11-30 (×11): qty 2
  Filled 2010-11-30: qty 1
  Filled 2010-11-30: qty 2

## 2010-11-30 MED ORDER — METFORMIN HCL 500 MG PO TABS
500.0000 mg | ORAL_TABLET | Freq: Two times a day (BID) | ORAL | Status: DC
  Administered 2010-12-01 – 2010-12-07 (×13): 500 mg via ORAL
  Filled 2010-11-30 (×13): qty 1

## 2010-11-30 MED ORDER — PREDNISONE 20 MG PO TABS
60.0000 mg | ORAL_TABLET | Freq: Once | ORAL | Status: AC
  Administered 2010-11-30: 60 mg via ORAL
  Filled 2010-11-30: qty 3

## 2010-11-30 MED ORDER — MOXIFLOXACIN HCL IN NACL 400 MG/250ML IV SOLN
400.0000 mg | INTRAVENOUS | Status: DC
  Administered 2010-11-30 – 2010-12-06 (×7): 400 mg via INTRAVENOUS
  Filled 2010-11-30 (×8): qty 250

## 2010-11-30 MED ORDER — POTASSIUM CHLORIDE 2 MEQ/ML IV SOLN
INTRAVENOUS | Status: AC
  Filled 2010-11-30: qty 1

## 2010-11-30 MED ORDER — INSULIN ASPART 100 UNIT/ML ~~LOC~~ SOLN
0.0000 [IU] | Freq: Every day | SUBCUTANEOUS | Status: DC
  Administered 2010-12-01: 5 [IU] via SUBCUTANEOUS
  Administered 2010-12-02: 3 [IU] via SUBCUTANEOUS
  Administered 2010-12-03: 4 [IU] via SUBCUTANEOUS
  Administered 2010-12-04: 2 [IU] via SUBCUTANEOUS
  Administered 2010-12-05 – 2010-12-06 (×2): 4 [IU] via SUBCUTANEOUS
  Filled 2010-11-30: qty 3

## 2010-11-30 MED ORDER — FLEET ENEMA 7-19 GM/118ML RE ENEM: 1.0000 | ENEMA | RECTAL | Status: DC | PRN

## 2010-11-30 MED ORDER — IPRATROPIUM BROMIDE 0.02 % IN SOLN
0.5000 mg | Freq: Once | RESPIRATORY_TRACT | Status: AC
  Administered 2010-11-30: 0.5 mg via RESPIRATORY_TRACT
  Filled 2010-11-30: qty 2.5

## 2010-11-30 MED ORDER — TRAZODONE HCL 50 MG PO TABS: 25.0000 mg | ORAL_TABLET | Freq: Every evening | ORAL | Status: DC | PRN

## 2010-11-30 MED ORDER — INSULIN ASPART 100 UNIT/ML ~~LOC~~ SOLN: 0.0000 [IU] | SUBCUTANEOUS | Status: DC

## 2010-11-30 MED ORDER — ONDANSETRON HCL 4 MG/2ML IJ SOLN: 4.0000 mg | Freq: Four times a day (QID) | INTRAMUSCULAR | Status: DC | PRN

## 2010-11-30 MED ORDER — METHYLPREDNISOLONE SODIUM SUCC 125 MG IJ SOLR
125.0000 mg | Freq: Four times a day (QID) | INTRAMUSCULAR | Status: DC
  Administered 2010-11-30 – 2010-12-03 (×10): 125 mg via INTRAVENOUS
  Filled 2010-11-30 (×10): qty 2

## 2010-11-30 MED ORDER — LACTATED RINGERS IV SOLN
INTRAVENOUS | Status: AC
  Filled 2010-11-30: qty 1000

## 2010-11-30 MED ORDER — LISINOPRIL 5 MG PO TABS
5.0000 mg | ORAL_TABLET | Freq: Every day | ORAL | Status: DC
  Administered 2010-12-01 – 2010-12-07 (×7): 5 mg via ORAL
  Filled 2010-11-30 (×7): qty 1

## 2010-11-30 MED ORDER — ALBUTEROL SULFATE (5 MG/ML) 0.5% IN NEBU: 5.0000 mg | INHALATION_SOLUTION | RESPIRATORY_TRACT | Status: DC

## 2010-11-30 MED ORDER — ALBUTEROL (5 MG/ML) CONTINUOUS INHALATION SOLN
15.0000 mg | INHALATION_SOLUTION | Freq: Once | RESPIRATORY_TRACT | Status: AC
  Administered 2010-11-30: 15 mg via RESPIRATORY_TRACT
  Filled 2010-11-30: qty 20

## 2010-11-30 MED ORDER — ONDANSETRON HCL 4 MG PO TABS: 4.0000 mg | ORAL_TABLET | Freq: Four times a day (QID) | ORAL | Status: DC | PRN

## 2010-11-30 MED ORDER — KCL-LACTATED RINGERS 20 MEQ/L IV SOLN
INTRAVENOUS | Status: DC
  Administered 2010-11-30: via INTRAVENOUS
  Administered 2010-12-02: 1000 mL via INTRAVENOUS
  Administered 2010-12-06: 07:00:00 via INTRAVENOUS
  Filled 2010-11-30 (×10): qty 1000

## 2010-11-30 MED ORDER — IPRATROPIUM BROMIDE 0.02 % IN SOLN
0.5000 mg | RESPIRATORY_TRACT | Status: DC
  Administered 2010-11-30 – 2010-12-07 (×37): 0.5 mg via RESPIRATORY_TRACT
  Filled 2010-11-30 (×32): qty 2.5

## 2010-11-30 MED ORDER — FAMOTIDINE 20 MG PO TABS
20.0000 mg | ORAL_TABLET | Freq: Two times a day (BID) | ORAL | Status: DC
  Administered 2010-11-30 – 2010-12-05 (×10): 20 mg via ORAL
  Filled 2010-11-30 (×10): qty 1

## 2010-11-30 MED ORDER — ALBUTEROL SULFATE (5 MG/ML) 0.5% IN NEBU
2.5000 mg | INHALATION_SOLUTION | RESPIRATORY_TRACT | Status: DC | PRN
  Administered 2010-12-04 – 2010-12-07 (×2): 2.5 mg via RESPIRATORY_TRACT
  Filled 2010-11-30: qty 0.5

## 2010-11-30 MED ORDER — BISACODYL 10 MG RE SUPP
10.0000 mg | RECTAL | Status: DC | PRN
Start: 2010-11-30 — End: 2010-12-07

## 2010-11-30 MED ORDER — ALBUTEROL SULFATE (5 MG/ML) 0.5% IN NEBU
5.0000 mg | INHALATION_SOLUTION | Freq: Once | RESPIRATORY_TRACT | Status: AC
  Administered 2010-11-30: 5 mg via RESPIRATORY_TRACT
  Filled 2010-11-30: qty 1

## 2010-11-30 MED ORDER — SODIUM CHLORIDE 0.9 % IJ SOLN
INTRAMUSCULAR | Status: AC
  Administered 2010-11-30: 10 mL
  Filled 2010-11-30: qty 10

## 2010-11-30 MED ORDER — SENNOSIDES-DOCUSATE SODIUM 8.6-50 MG PO TABS: 1.0000 | ORAL_TABLET | Freq: Every day | ORAL | Status: DC | PRN

## 2010-11-30 MED ORDER — MOXIFLOXACIN HCL IN NACL 400 MG/250ML IV SOLN
INTRAVENOUS | Status: AC
  Filled 2010-11-30: qty 250

## 2010-11-30 MED ORDER — ACETAMINOPHEN 650 MG RE SUPP: 650.0000 mg | Freq: Four times a day (QID) | RECTAL | Status: DC | PRN

## 2010-11-30 MED ORDER — PANTOPRAZOLE SODIUM 40 MG PO TBEC
40.0000 mg | DELAYED_RELEASE_TABLET | Freq: Every day | ORAL | Status: DC
  Administered 2010-11-30 – 2010-12-01 (×2): 40 mg via ORAL
  Filled 2010-11-30 (×2): qty 1

## 2010-11-30 MED ORDER — ACETAMINOPHEN 325 MG PO TABS
650.0000 mg | ORAL_TABLET | Freq: Four times a day (QID) | ORAL | Status: DC | PRN
  Filled 2010-11-30: qty 2

## 2010-11-30 NOTE — ED Notes (Signed)
Called to give report. Was advised nurse would call me back.

## 2010-11-30 NOTE — ED Notes (Signed)
Pt states that she has had cough, congestion and chest pain since last night. Pt states that it hurts really bad when she coughs or takes a deep breath. States that she is coughing up yellow phlegm. Denies fever.

## 2010-11-30 NOTE — ED Notes (Signed)
Pt still having expiratory wheezing after breathing treatment. Pt has nonproductive cough at this time.

## 2010-11-30 NOTE — ED Provider Notes (Signed)
History     CSN: [id number] Arrival date & time: 11/30/2010  4:37 PM  Chief Complaint  Patient presents with  . Cough   Patient is a 45 y.o. female presenting with cough. The history is provided by the patient.  Cough This is a recurrent (Has history of copd.  Had upper respiratory infection last week which has settled in her chest.) problem. The current episode started more than 2 days ago. The problem occurs constantly. The problem has been gradually worsening. The cough is productive of sputum. There has been no fever (subjective fevers.). Associated symptoms include chest pain, chills, shortness of breath and wheezing. Pertinent negatives include no rhinorrhea and no sore throat. Associated symptoms comments: Chest pain is midsternal, ttp,  Worse with cough.. Treatments tried: albuterol neb and tessalon perles. The treatment provided no relief. She is a smoker. Her past medical history is significant for COPD.    Past Medical History  Diagnosis Date  . Diabetes mellitus   . COPD (chronic obstructive pulmonary disease)   . HTN (hypertension)   . Low back pain   . Tachycardia     never had test done since no insurance  . Depression     Past Surgical History  Procedure Date  . Cholecystectomy 1990  . Cesarean section     twice  . Kidney surgery     as child for blockages  . Tubal ligation   . Tonsillectomy     Family History  Problem Relation Age of Onset  . Heart attack Father 108    deceased, etoh use  . Heart attack Mother 33    deceased  . Diabetes Mother   . Breast cancer Mother   . Heart failure Mother     oxygen dependence, nonsmoker  . Colon cancer Neg Hx   . Liver disease Maternal Aunt 40    died while on liver transplant list  . Heart attack Maternal Grandmother     premature CAD  . Ulcers Sister     History  Substance Use Topics  . Smoking status: Current Everyday Smoker -- 1.0 packs/day for 30 years    Types: Cigarettes  . Smokeless tobacco: Not on  file  . Alcohol Use: No    OB History    Grav Para Term Preterm Abortions TAB SAB Ect Mult Living                  Review of Systems  Constitutional: Positive for fever, chills and fatigue.  HENT: Negative for sore throat and rhinorrhea.   Respiratory: Positive for cough, shortness of breath and wheezing.   Cardiovascular: Positive for chest pain. Negative for palpitations and leg swelling.  Genitourinary: Negative.   Musculoskeletal: Negative.   Neurological: Positive for weakness.    Physical Exam  BP 125/57  Pulse 113  Temp(Src) 98.5 F (36.9 C) (Oral)  Resp 20  Ht 5\' 4"  (1.626 m)  Wt 285 lb (129.275 kg)  BMI 48.92 kg/m2  SpO2 92%  LMP 11/30/2010  Physical Exam  Vitals reviewed. Constitutional: She is oriented to person, place, and time. She appears well-developed and well-nourished.  HENT:  Head: Normocephalic and atraumatic.  Eyes: Conjunctivae are normal.  Neck: Normal range of motion.  Cardiovascular: Regular rhythm, normal heart sounds and normal pulses.  Tachycardia present.  Exam reveals no friction rub.   No murmur heard. Pulmonary/Chest: Effort normal. She has decreased breath sounds. She has wheezes.       Decreased breath sounds  and wheezing throughout all lung fields.  Abdominal: Soft. Bowel sounds are normal. There is no tenderness.  Musculoskeletal: Normal range of motion.  Neurological: She is alert and oriented to person, place, and time.  Skin: Skin is warm and dry.  Psychiatric: She has a normal mood and affect.    ED Course  Procedures  No improvement in respiratory exam after albuterol 5 mg and atrovent 0.5 mg neb.  Prednisone 60 mg po given.  Albuterol 15 mg over 1 hour repeated.  No improvement in breathing,  Still with moderate wheezing all lung fields,  sats 92-94 on 2 liters.    MDM   Spoke with Dr.Campbell with [facility] - will admit to hospital.  Temp admit orders.    Medical screening examination/treatment/procedure(s) were  performed by non-physician practitioner and as supervising physician I was immediately available for consultation/collaboration.  Candis Musa, PA 11/30/10 2055  Jasmine Awe, MD 11/30/10 2133

## 2010-11-30 NOTE — ED Notes (Signed)
Pt remains on cardiac monitor w/ NIBP vital signs WNL. NAD noted. Pt requesting something for pain in her chest.

## 2010-11-30 NOTE — ED Notes (Signed)
Report given to Candelaria, Charity fundraiser. Was advised she would call to transport pt up there is no bed in room at this time.

## 2010-11-30 NOTE — ED Notes (Signed)
Pt assisted to restroom via [employer].

## 2010-11-30 NOTE — ED Notes (Signed)
Pt provided a frozen meal tray at this time.

## 2010-11-30 NOTE — ED Notes (Signed)
Pt has a hx of copd. States she has been doing breathing tx at home but they are not helping.

## 2010-12-01 LAB — HEMOGLOBIN A1C
Hgb A1c MFr Bld: 8.3 % — ABNORMAL HIGH (ref <5.7)
Mean Plasma Glucose: 192 mg/dL — ABNORMAL HIGH (ref <117)

## 2010-12-01 LAB — BASIC METABOLIC PANEL
BUN: 8 mg/dL (ref 6–23)
CO2: 22 mEq/L (ref 19–32)
Calcium: 9.2 mg/dL (ref 8.4–10.5)
Chloride: 99 mEq/L (ref 96–112)
Creatinine, Ser: 0.56 mg/dL (ref 0.50–1.10)
GFR calc Af Amer: >60 mL/min (ref >60)
GFR calc non Af Amer: >60 mL/min (ref >60)
Glucose, Bld: 238 mg/dL — ABNORMAL HIGH (ref 70–99)
Potassium: 3.7 mEq/L (ref 3.5–5.1)
Sodium: 136 mEq/L (ref 135–145)

## 2010-12-01 LAB — CBC
HCT: 31.8 % — ABNORMAL LOW (ref 36.0–46.0)
Hemoglobin: 9.5 g/dL — ABNORMAL LOW (ref 12.0–15.0)
MCH: 20.7 pg — ABNORMAL LOW (ref 26.0–34.0)
MCHC: 29.9 g/dL — ABNORMAL LOW (ref 30.0–36.0)
MCV: 69.4 fL — ABNORMAL LOW (ref 78.0–100.0)
Platelets: 367 10*3/uL (ref 150–400)
RBC: 4.58 MIL/uL (ref 3.87–5.11)
RDW: 19.1 % — ABNORMAL HIGH (ref 11.5–15.5)
WBC: 16.8 10*3/uL — ABNORMAL HIGH (ref 4.0–10.5)

## 2010-12-01 LAB — GLUCOSE, CAPILLARY
Glucose-Capillary: 249 mg/dL — ABNORMAL HIGH (ref 70–99)
Glucose-Capillary: 372 mg/dL — ABNORMAL HIGH (ref 70–99)
Glucose-Capillary: 319 mg/dL — ABNORMAL HIGH (ref 70–99)
Glucose-Capillary: 372 mg/dL — ABNORMAL HIGH (ref 70–99)

## 2010-12-01 MED ORDER — IBUPROFEN 800 MG PO TABS
400.0000 mg | ORAL_TABLET | Freq: Once | ORAL | Status: AC
  Administered 2010-12-01: 400 mg via ORAL
  Filled 2010-12-01: qty 1

## 2010-12-01 MED ORDER — INSULIN ASPART 100 UNIT/ML ~~LOC~~ SOLN
3.0000 [IU] | Freq: Once | SUBCUTANEOUS | Status: AC
  Administered 2010-12-01: 3 [IU] via SUBCUTANEOUS

## 2010-12-01 MED ORDER — GUAIFENESIN 100 MG/5ML PO SOLN
5.0000 mL | Freq: Four times a day (QID) | ORAL | Status: DC | PRN
  Administered 2010-12-01 – 2010-12-04 (×2): 100 mg via ORAL
  Filled 2010-12-01 (×3): qty 5

## 2010-12-01 MED ORDER — IBUPROFEN 600 MG PO TABS
600.0000 mg | ORAL_TABLET | Freq: Four times a day (QID) | ORAL | Status: DC | PRN
  Administered 2010-12-01: 600 mg via ORAL
  Filled 2010-12-01: qty 1

## 2010-12-01 MED ORDER — FLUTICASONE PROPIONATE 50 MCG/ACT NA SUSP
2.0000 | Freq: Every day | NASAL | Status: DC
  Administered 2010-12-01 – 2010-12-07 (×7): 2 via NASAL
  Filled 2010-12-01: qty 2

## 2010-12-01 MED ORDER — NICOTINE 21 MG/24HR TD PT24
21.0000 mg | MEDICATED_PATCH | Freq: Every day | TRANSDERMAL | Status: DC
  Administered 2010-12-01 – 2010-12-07 (×7): 21 mg via TRANSDERMAL
  Filled 2010-12-01 (×7): qty 1

## 2010-12-01 MED ORDER — INSULIN ASPART 100 UNIT/ML ~~LOC~~ SOLN
0.0000 [IU] | Freq: Three times a day (TID) | SUBCUTANEOUS | Status: DC
  Administered 2010-12-01: 7 [IU] via SUBCUTANEOUS
  Administered 2010-12-01: 15 [IU] via SUBCUTANEOUS
  Administered 2010-12-01 – 2010-12-02 (×2): 20 [IU] via SUBCUTANEOUS
  Administered 2010-12-02: 15 [IU] via SUBCUTANEOUS
  Administered 2010-12-02 – 2010-12-03 (×2): 11 [IU] via SUBCUTANEOUS
  Administered 2010-12-03: 15 [IU] via SUBCUTANEOUS
  Administered 2010-12-03: 20 [IU] via SUBCUTANEOUS
  Administered 2010-12-04 (×2): 7 [IU] via SUBCUTANEOUS
  Administered 2010-12-04: 11 [IU] via SUBCUTANEOUS
  Administered 2010-12-05: 7 [IU] via SUBCUTANEOUS
  Administered 2010-12-05: 20 [IU] via SUBCUTANEOUS
  Administered 2010-12-05: 11 [IU] via SUBCUTANEOUS
  Administered 2010-12-06: 7 [IU] via SUBCUTANEOUS
  Administered 2010-12-06: 11 [IU] via SUBCUTANEOUS
  Administered 2010-12-06: 15 [IU] via SUBCUTANEOUS
  Administered 2010-12-07: 3 [IU] via SUBCUTANEOUS
  Administered 2010-12-07: 4 [IU] via SUBCUTANEOUS
  Filled 2010-12-01: qty 3

## 2010-12-01 NOTE — Progress Notes (Signed)
Subjective:  still wheezing but better.  Coughing [name] lot. Objective: Vital signs in last 24 hours: Filed Vitals:   12/01/10 0506 12/01/10 0658 12/01/10 0726 12/01/10 1132  BP: 119/66     Pulse: 93     Temp: 98.4 F (36.9 C)     TempSrc:      Resp: 20     Height:      Weight:  124.6 kg (274 lb 11.1 oz)    SpO2: 90%  96% 89%   Weight change:   Intake/Output Summary (Last 24 hours) at 12/01/10 1357 Last data filed at 12/01/10 0900  Gross per 24 hour  Intake 1369.17 ml  Output   1150 ml  Net 219.17 ml   General appearance: alert, cooperative and no distress Lungs: wheezes base - right and bilaterally Heart: regular rate and rhythm, S1, S2 normal, no murmur, click, rub or gallop Abdomen: soft, non-tender; bowel sounds normal; no masses,  no organomegaly Extremities: extremities normal, atraumatic, no cyanosis or edema Lab Results: Admission on 11/30/2010  Component Date Value Range Status  . Glucose-Capillary (mg/dL) 11/91/4782 956* 21-30 Final  . WBC (K/uL) 11/30/2010 14.7* 4.0-10.5 Final  . RBC (MIL/uL) 11/30/2010 4.50  3.87-5.11 Final  . Hemoglobin (g/dL) 86/57/8469 9.3* 62.9-52.8 Final  . HCT (%) 11/30/2010 31.5* 36.0-46.0 Final  . MCV (fL) 11/30/2010 70.0* 78.0-100.0 Final  . MCH (pg) 11/30/2010 20.7* 26.0-34.0 Final  . MCHC (g/dL) 41/32/4401 02.7* 25.3-66.4 Final  . RDW (%) 11/30/2010 18.8* 11.5-15.5 Final  . Platelets (K/uL) 11/30/2010 347  150-400 Final  . Neutrophils Relative (%) 11/30/2010 73  43-77 Final  . Neutro Abs (K/uL) 11/30/2010 10.7* 1.7-7.7 Final  . Lymphocytes Relative (%) 11/30/2010 19  12-46 Final  . Lymphs Abs (K/uL) 11/30/2010 2.8  0.7-4.0 Final  . Monocytes Relative (%) 11/30/2010 7  3-12 Final  . Monocytes Absolute (K/uL) 11/30/2010 1.0  0.1-1.0 Final  . Eosinophils Relative (%) 11/30/2010 1  0-5 Final  . Eosinophils Absolute (K/uL) 11/30/2010 0.1  0.0-0.7 Final  . Basophils Relative (%) 11/30/2010 0  0-1 Final  . Basophils Absolute (K/uL)  11/30/2010 0.0  0.0-0.1 Final  . Sodium (mEq/L) 11/30/2010 135  135-145 Final  . Potassium (mEq/L) 11/30/2010 3.4* 3.5-5.1 Final  . Chloride (mEq/L) 11/30/2010 97  96-112 Final  . CO2 (mEq/L) 11/30/2010 24  19-32 Final  . Glucose, Bld (mg/dL) 40/34/7425 956* 38-75 Final  . BUN (mg/dL) 64/33/2951 6  8-84 Final  . Creatinine, Ser (mg/dL) 16/60/6301 6.01  0.93-2.35 Final  . Calcium (mg/dL) 57/32/2025 9.1  4.2-70.6 Final  . GFR calc non Af Amer (mL/min) 11/30/2010 >60  >60 Final  . GFR calc Af Amer (mL/min) 11/30/2010 >60  >60 Final   Comment:                                 The eGFR has been calculated                          using the MDRD equation.                          This calculation has not been                          validated in all clinical  situations.                          eGFR's persistently                          <60 mL/min signify                          possible Chronic Kidney Disease.  . Color, Urine  11/30/2010 STRAW* YELLOW Final  . Appearance  11/30/2010 CLEAR  CLEAR Final  . Specific Gravity, Urine  11/30/2010 <1.005* 1.005-1.030 Final  . pH  11/30/2010 6.0  5.0-8.0 Final  . Glucose, UA (mg/dL) 14/78/2956 213* NEGATIVE Final  . Hgb urine dipstick  11/30/2010 SMALL* NEGATIVE Final  . Bilirubin Urine  11/30/2010 NEGATIVE  NEGATIVE Final  . Ketones, ur (mg/dL) 08/65/7846 NEGATIVE  NEGATIVE Final  . Protein, ur (mg/dL) 96/29/5284 NEGATIVE  NEGATIVE Final  . Urobilinogen, UA (mg/dL) 13/24/4010 0.2  2.7-2.5 Final  . Nitrite  11/30/2010 NEGATIVE  NEGATIVE Final  . Leukocytes, UA  11/30/2010 NEGATIVE  NEGATIVE Final  . Glucose-Capillary (mg/dL) 36/64/4034 742* 59-56 Final  . Squamous Epithelial / LPF  11/30/2010 FEW* RARE Final  . RBC / HPF (RBC/hpf) 11/30/2010 0-2  <3 Final  . Sodium (mEq/L) 12/01/2010 136  135-145 Final  . Potassium (mEq/L) 12/01/2010 3.7  3.5-5.1 Final  . Chloride (mEq/L) 12/01/2010 99  96-112 Final  . CO2 (mEq/L)  12/01/2010 22  19-32 Final  . Glucose, Bld (mg/dL) 38/75/6433 295* 18-84 Final  . BUN (mg/dL) 16/60/6301 8  6-01 Final  . Creatinine, Ser (mg/dL) 09/32/3557 3.22  0.25-4.27 Final  . Calcium (mg/dL) 10/15/7626 9.2  3.1-51.7 Final  . GFR calc non Af Amer (mL/min) 12/01/2010 >60  >60 Final  . GFR calc Af Amer (mL/min) 12/01/2010 >60  >60 Final   Comment:                                 The eGFR has been calculated                          using the MDRD equation.                          This calculation has not been                          validated in all clinical                          situations.                          eGFR's persistently                          <60 mL/min signify                          possible Chronic Kidney Disease.  . WBC (K/uL) 12/01/2010 16.8* 4.0-10.5 Final  . RBC (MIL/uL) 12/01/2010 4.58  3.87-5.11 Final  . Hemoglobin (g/dL) 61/60/7371 9.5* 06.2-69.4 Final  . HCT (%) 12/01/2010 31.8* 36.0-46.0 Final  .  MCV (fL) 12/01/2010 69.4* 78.0-100.0 Final  . MCH (pg) 12/01/2010 20.7* 26.0-34.0 Final  . MCHC (g/dL) 25/36/6440 34.7* 42.5-95.6 Final  . RDW (%) 12/01/2010 19.1* 11.5-15.5 Final  . Platelets (K/uL) 12/01/2010 367  150-400 Final  . Glucose-Capillary (mg/dL) 38/75/6433 295* 18-84 Final  . Comment 1  12/01/2010 Notify RN   Final  . Comment 2  12/01/2010 Documented in Chart   Final  . Glucose-Capillary (mg/dL) 16/60/6301 601* 09-32 Final  . Comment 1  12/01/2010 Notify RN   Final  . Comment 2  12/01/2010 Documented in Chart   Final    Micro Results: No results found for this or any previous visit (from the past 240 hour(s)). Studies/Results: Dg Chest 2 View  11/30/2010  *RADIOLOGY REPORT*  Clinical Data: Shortness of breath.  Cough.  CHEST - 2 VIEW  Comparison: Two-view chest 03/13/2010.  Findings: The heart size is normal.  Minimal linear scarring or atelectasis is noted at the lung bases.  No focal airspace disease is evident.  The visualized soft  tissues and bony thorax are unremarkable.  IMPRESSION:  1.  No acute cardiopulmonary disease. 2.  Minimal bibasilar atelectasis versus scarring.  Original Report Authenticated By: Jamesetta Orleans. MATTERN, M.D.   Ct Maxillofacial Wo Cm  11/30/2010  *RADIOLOGY REPORT*  Clinical Data: Facial pain, evaluate for sinusitis  CT MAXILLOFACIAL WITHOUT CONTRAST  Technique:  Multidetector CT imaging of the maxillofacial structures was performed. Multiplanar CT image reconstructions were also generated.  Comparison: None.  Findings: Mucosal thickening/partial opacification of the bilateral ethmoid, left sphenoid, and left maxillary sinuses.  Bilateral frontal sinuses, right maxillary sinus, and right sphenoid sinus are essentially clear.  Partial opacification of the right mastoid air cells.  The left mastoid air cells are clear.  Visualized brain parenchyma is unremarkable.  The visualized soft tissues are within normal limits.  No evidence of maxillofacial fracture.  IMPRESSION: Mucosal sinus disease, as above.  Original Report Authenticated By: Charline Bills, M.D.   Scheduled Meds:    . albuterol  2.5 mg Nebulization Q4H  . albuterol  5 mg Nebulization Once  . albuterol  15 mg Nebulization Once  . famotidine  20 mg Oral BID  . fluticasone  2 spray Each Nare Daily  . guaiFENesin  1,200 mg Oral BID  . ibuprofen  400 mg Oral Once  . insulin aspart  0-20 Units Subcutaneous TID WC  . insulin aspart  0-5 Units Subcutaneous QHS  . insulin aspart  3 Units Subcutaneous Once  . ipratropium  0.5 mg Nebulization Once  . ipratropium  0.5 mg Nebulization Q4H  . lisinopril  5 mg Oral Daily  . metFORMIN  500 mg Oral BID WC  . methylPREDNISolone (SOLU-MEDROL) injection  125 mg Intravenous Q6H  . moxifloxacin  400 mg Intravenous Q24H  . pantoprazole  40 mg Oral Q1200  . predniSONE  60 mg Oral Once  . sodium chloride      . DISCONTD: albuterol  5 mg Nebulization Q4H  . DISCONTD: insulin aspart  0-20 Units  Subcutaneous Q4H   Continuous Infusions:   . lactated ringers with KCl 20 mEq/L 50 mL/hr at 12/01/10 0503   PRN Meds:.acetaminophen, acetaminophen, albuterol, bisacodyl, ondansetron (ZOFRAN) IV, ondansetron, senna-docusate, sodium phosphate, traZODone Assessment/Plan: Principal Problem:  *COPD with acute exacerbation  Improving slowly.  Cont nebs/ox. Steroids.  Add antitussive. Active Problems:  Acute sinusitis  Cont avelox   LOS: 1 day   [name] 12/01/2010, 1:57 PM

## 2010-12-01 NOTE — H&P (Signed)
PCP:   Sheila Kelch, NP, NP   Chief Complaint:  Persistent cough and shortness of breath for 5 days.  HPI: [name] with a history of sinusitis and tobacco was been having headache postnasal drip and cough for the past 5 days, unrelieved by over-the-counter cough medications and increasing use of her home bronchodilators. Her symptoms have been associated with a cough, mostly in the morning, productive of yellow sputum. She has been having facial pain.  eventually she came to the emergency room for assistance, and was found to be having wheezing and rhonchi which did not improve with serial nebulization and the hospitalist service was called to assist with management  Review of Systems:  The patient denies anorexia, fever, weight loss,, vision loss, decreased hearing, hoarseness, chest pain, syncope, dyspnea on exertion, peripheral edema, balance deficits, hemoptysis, abdominal pain, melena, hematochezia, severe indigestion/heartburn, hematuria, incontinence, genital sores, muscle weakness, suspicious skin lesions, transient blindness, difficulty walking, depression, unusual weight change, abnormal bleeding, enlarged lymph nodes, angioedema, and breast masses.  Past Medical History: Past Medical History  Diagnosis Date  . Diabetes mellitus   . COPD (chronic obstructive pulmonary disease)   . HTN (hypertension)   . Low back pain   . Tachycardia     never had test done since no insurance  . Depression   . Asthma   . Shortness of breath   . Anxiety    Past Surgical History  Procedure Date  . Cholecystectomy 1990  . Cesarean section     twice  . Kidney surgery     as child for blockages  . Tubal ligation   . Tonsillectomy   . Wrist surgery 1995    Lt wrist    Medications: Prior to Admission medications   Medication Sig Start Date End Date Taking? Authorizing Provider  albuterol (PROVENTIL HFA) 108 (90 BASE) MCG/ACT inhaler Inhale 2 puffs into the lungs daily as  needed. Wheezing/shortnes of breath    Yes Historical Provider, MD  ibuprofen (ADVIL,MOTRIN) 200 MG tablet Take 800 mg by mouth once as needed. For cramps    Yes Historical Provider, MD  metFORMIN (GLUCOPHAGE) 500 MG tablet Take 500 mg by mouth 2 (two) times daily with a meal.    Yes Historical Provider, MD  omeprazole (PRILOSEC) 20 MG capsule Take 20 mg by mouth daily.     Yes Historical Provider, MD  Phenyleph-Doxylamine-DM-APAP (TYLENOL COLD MULTI-SYMPTOM) 5-6.25-10-325 MG/15ML LIQD Take 15 mLs by mouth once as needed. For cold symptoms/pain    Yes Historical Provider, MD  ranitidine (ZANTAC) 150 MG tablet Take 150 mg by mouth 2 (two) times daily.     Yes Historical Provider, MD  lisinopril (PRINIVIL,ZESTRIL) 5 MG tablet Take 5 mg by mouth daily.      Historical Provider, MD  oxyCODONE-acetaminophen (PERCOCET) 5-325 MG per tablet Take 1 tablet by mouth every 4 (four) hours as needed.      Historical Provider, MD    Allergies:   Allergies  Allergen Reactions  . Codeine Nausea Only    Social History:  reports that she has been smoking Cigarettes.  She has a 45 pack-year smoking history. She does not have any smokeless tobacco history on file. She reports that she does not drink alcohol or use illicit drugs.  Family History: Family History  Problem Relation Age of Onset  . Heart attack Father 54    deceased, etoh use  . Heart attack Mother 11    deceased  . Diabetes Mother   .  Breast cancer Mother   . Heart failure Mother     oxygen dependence, nonsmoker  . Colon cancer Neg Hx   . Liver disease Maternal Aunt 40    died while on liver transplant list  . Heart attack Maternal Grandmother     premature CAD  . Ulcers Sister     Physical Exam: Filed Vitals:   11/30/10 2136 11/30/10 2236 11/30/10 2318 11/30/10 2358  BP: 136/72  130/80   Pulse: 106  100   Temp:   98 F (36.7 C)   TempSrc:      Resp: 22  20   Height:  5\' 4"  (1.626 m)    Weight:  126 kg (277 lb 12.5 oz)      SpO2: 93%  87% 93%   General appearance: alert, cooperative, mild distress and morbidly obese Eyes: PERL, mm pink Throat: lips, mucosa, and tongue normal; teeth and gums normal Neck: no adenopathy, no carotid bruit, no JVD, supple, symmetrical, trachea midline, thyroid not enlarged, symmetric, no tenderness/mass/nodules and Thick neck Back: symmetric, no curvature. ROM normal. No CVA tenderness. Resp: rhonchi bilaterally and wheezes bilaterally Chest wall: no tenderness Cardio: regular rate and rhythm, S1, S2 normal, no murmur, click, rub or gallop GI: soft, non-tender; bowel sounds normal; no masses,  no organomegaly and obese Extremities: extremities normal, atraumatic, no cyanosis or edema and no edema, redness or tenderness in the calves or thighs Skin: Skin color, texture, turgor normal. No rashes or lesions Neurologic: Grossly normal   Labs on Admission:   [facility] 11/30/10 1854  NA 135  K 3.4*  CL 97  CO2 24  GLUCOSE 147*  BUN 6  CREATININE 0.58  CALCIUM 9.1  MG --  PHOS --   No results found for this basename: AST:2,ALT:2,ALKPHOS:2,BILITOT:2,PROT:2,ALBUMIN:2 in the last 72 hours No results found for this basename: LIPASE:2,AMYLASE:2 in the last 72 hours  Basename 11/30/10 1854  WBC 14.7*  NEUTROABS 10.7*  HGB 9.3*  HCT 31.5*  MCV 70.0*  PLT 347   No results found for this basename: CKTOTAL:3,CKMB:3,CKMBINDEX:3,TROPONINI:3 in the last 72 hours No results found for this basename: TSH,T4TOTAL,FREET3,T3FREE,THYROIDAB in the last 72 hours No results found for this basename: VITAMINB12:2,FOLATE:2,FERRITIN:2,TIBC:2,IRON:2,RETICCTPCT:2 in the last 72 hours  Radiological Exams on Admission: Dg Chest 2 View  11/30/2010  *RADIOLOGY REPORT*  Clinical Data: Shortness of breath.  Cough.  CHEST - 2 VIEW  Comparison: Two-view chest 03/13/2010.  Findings: The heart size is normal.  Minimal linear scarring or atelectasis is noted at the lung bases.  No focal airspace disease is  evident.  The visualized soft tissues and bony thorax are unremarkable.  IMPRESSION:  1.  No acute cardiopulmonary disease. 2.  Minimal bibasilar atelectasis versus scarring.  Original Report Authenticated By: Jamesetta Orleans. MATTERN, M.D.   Ct Maxillofacial Wo Cm  11/30/2010  *RADIOLOGY REPORT*  Clinical Data: Facial pain, evaluate for sinusitis  CT MAXILLOFACIAL WITHOUT CONTRAST  Technique:  Multidetector CT imaging of the maxillofacial structures was performed. Multiplanar CT image reconstructions were also generated.  Comparison: None.  Findings: Mucosal thickening/partial opacification of the bilateral ethmoid, left sphenoid, and left maxillary sinuses.  Bilateral frontal sinuses, right maxillary sinus, and right sphenoid sinus are essentially clear.  Partial opacification of the right mastoid air cells.  The left mastoid air cells are clear.  Visualized brain parenchyma is unremarkable.  The visualized soft tissues are within normal limits.  No evidence of maxillofacial fracture.  IMPRESSION: Mucosal sinus disease, as above.  Original  Report Authenticated By: Charline Bills, M.D.    Assessment/Plan Present on Admission:  .Acute sinusitis .COPD with acute exacerbation Hypokalemia DM-2 HTN   Will continue steroids and serial bronchodilator therapy and as well as antibiotics for presumed sinusitis.  Will get a CT scan of the maxillofacial sinuses to confirm and will give steroid spray for sinus decongestion.  We'll try to minimize systemic steroids since her wheezing is probably precipitated by her sinusitis. we'll replete potassium.   other plans as per orders Jacques Fife 12/01/2010, 1:03 AM

## 2010-12-02 DIAGNOSIS — I10 Essential (primary) hypertension: Secondary | ICD-10-CM | POA: Diagnosis present

## 2010-12-02 DIAGNOSIS — E1142 Type 2 diabetes mellitus with diabetic polyneuropathy: Secondary | ICD-10-CM | POA: Diagnosis present

## 2010-12-02 DIAGNOSIS — E1149 Type 2 diabetes mellitus with other diabetic neurological complication: Secondary | ICD-10-CM

## 2010-12-02 DIAGNOSIS — I152 Hypertension secondary to endocrine disorders: Secondary | ICD-10-CM

## 2010-12-02 DIAGNOSIS — F17210 Nicotine dependence, cigarettes, uncomplicated: Secondary | ICD-10-CM

## 2010-12-02 DIAGNOSIS — E119 Type 2 diabetes mellitus without complications: Secondary | ICD-10-CM | POA: Diagnosis present

## 2010-12-02 DIAGNOSIS — J9691 Respiratory failure, unspecified with hypoxia: Secondary | ICD-10-CM | POA: Diagnosis present

## 2010-12-02 DIAGNOSIS — R0902 Hypoxemia: Secondary | ICD-10-CM | POA: Diagnosis present

## 2010-12-02 DIAGNOSIS — E1159 Type 2 diabetes mellitus with other circulatory complications: Secondary | ICD-10-CM

## 2010-12-02 HISTORY — DX: Type 2 diabetes mellitus with other diabetic neurological complication: E11.49

## 2010-12-02 HISTORY — DX: Nicotine dependence, cigarettes, uncomplicated: F17.210

## 2010-12-02 HISTORY — DX: Hypertension secondary to endocrine disorders: I15.2

## 2010-12-02 HISTORY — DX: Type 2 diabetes mellitus with other circulatory complications: E11.59

## 2010-12-02 LAB — BLOOD GAS, ARTERIAL
Acid-base deficit: 1.5 mmol/L (ref 0.0–2.0)
Bicarbonate: 22.5 mEq/L (ref 20.0–24.0)
O2 Content: 5 L/min
O2 Saturation: 89.5 %
Patient temperature: 37
TCO2: 21.1 mmol/L (ref 0–100)
pCO2 arterial: 36.9 mmHg (ref 35.0–45.0)
pH, Arterial: 7.402 — ABNORMAL HIGH (ref 7.350–7.400)
pO2, Arterial: 60.3 mmHg — ABNORMAL LOW (ref 80.0–100.0)

## 2010-12-02 LAB — GLUCOSE, CAPILLARY
Glucose-Capillary: 263 mg/dL — ABNORMAL HIGH (ref 70–99)
Glucose-Capillary: 385 mg/dL — ABNORMAL HIGH (ref 70–99)
Glucose-Capillary: 316 mg/dL — ABNORMAL HIGH (ref 70–99)
Glucose-Capillary: 277 mg/dL — ABNORMAL HIGH (ref 70–99)

## 2010-12-02 LAB — OCCULT BLOOD X 1 CARD TO LAB, STOOL: Fecal Occult Bld: NEGATIVE

## 2010-12-02 MED ORDER — MAGNESIUM SULFATE 50 % IJ SOLN
1.0000 g | Freq: Once | INTRAVENOUS | Status: AC
  Administered 2010-12-02: 1 g via INTRAVENOUS
  Filled 2010-12-02: qty 2

## 2010-12-02 MED ORDER — HYDROCOD POLST-CHLORPHEN POLST 10-8 MG/5ML PO LQCR
5.0000 mL | Freq: Two times a day (BID) | ORAL | Status: DC | PRN
  Administered 2010-12-02 – 2010-12-07 (×10): 5 mL via ORAL
  Filled 2010-12-02 (×10): qty 5

## 2010-12-02 MED ORDER — INSULIN GLARGINE 100 UNIT/ML ~~LOC~~ SOLN
15.0000 [IU] | Freq: Two times a day (BID) | SUBCUTANEOUS | Status: DC
  Administered 2010-12-02 (×2): 15 [IU] via SUBCUTANEOUS
  Filled 2010-12-02: qty 3

## 2010-12-02 MED ORDER — POTASSIUM CHLORIDE CRYS ER 20 MEQ PO TBCR
20.0000 meq | EXTENDED_RELEASE_TABLET | Freq: Every day | ORAL | Status: AC
  Administered 2010-12-02: 20 meq via ORAL
  Filled 2010-12-02: qty 1

## 2010-12-02 MED ORDER — ALPRAZOLAM 0.5 MG PO TABS
0.5000 mg | ORAL_TABLET | Freq: Three times a day (TID) | ORAL | Status: DC | PRN
  Administered 2010-12-02 – 2010-12-07 (×12): 0.5 mg via ORAL
  Filled 2010-12-02 (×12): qty 1

## 2010-12-02 MED ORDER — INSULIN ASPART 100 UNIT/ML ~~LOC~~ SOLN
5.0000 [IU] | Freq: Three times a day (TID) | SUBCUTANEOUS | Status: DC
  Administered 2010-12-02 – 2010-12-03 (×3): 5 [IU] via SUBCUTANEOUS

## 2010-12-02 NOTE — Progress Notes (Signed)
Subjective: The patient complains of worsening wheezing and chest congestion. She wants something a little stronger for cough.  Objective: Vital signs in last 24 hours: Filed Vitals:   12/02/10 0212 12/02/10 0608 12/02/10 0752 12/02/10 0900  BP: 93/55 93/56  133/70  Pulse: 103 105 104 124  Temp: 98.3 F (36.8 C) 97.7 F (36.5 C)  98.6 F (37 C)  TempSrc:    Oral  Resp: 24 20 20 18   Height:      Weight:  128.3 kg (282 lb 13.6 oz)    SpO2: 94% 95% 84% 91%    Intake/Output Summary (Last 24 hours) at 12/02/10 1042 Last data filed at 12/02/10 0800  Gross per 24 hour  Intake 2849.17 ml  Output   1400 ml  Net 1449.17 ml    Weight change: -0.975 kg (-2 lb 2.4 oz)  General appearance: alert, cooperative and mild distress Resp: rales bilaterally and wheezes bilaterally Cardio: S1 S2 w/ tachycardia Extremities: extremities normal, atraumatic, no cyanosis or edema Pulses: 2+ and symmetric  Lab Results: Basic Metabolic Panel:  Basename 12/01/10 0509 11/30/10 1854  NA 136 135  K 3.7 3.4*  CL 99 97  CO2 22 24  GLUCOSE 238* 147*  BUN 8 6  CREATININE 0.56 0.58  CALCIUM 9.2 9.1  MG -- --  PHOS -- --   Liver Function Tests: No results found for this basename: AST:2,ALT:2,ALKPHOS:2,BILITOT:2,PROT:2,ALBUMIN:2 in the last 72 hours No results found for this basename: LIPASE:2,AMYLASE:2 in the last 72 hours CBC:  Basename 12/01/10 0509 11/30/10 1854  WBC 16.8* 14.7*  NEUTROABS -- 10.7*  HGB 9.5* 9.3*  HCT 31.8* 31.5*  MCV 69.4* 70.0*  PLT 367 347   Cardiac Enzymes: No results found for this basename: CKTOTAL:3,CKMB:3,CKMBINDEX:3,TROPONINI:3 in the last 72 hours BNP: No results found for this basename: POCBNP:3 in the last 72 hours D-Dimer: No results found for this basename: DDIMER:2 in the last 72 hours CBG:  Basename 12/02/10 0745 12/01/10 2111 12/01/10 1625 12/01/10 1126 12/01/10 0740 11/30/10 2316  GLUCAP 263* 372* 319* 372* 249* 262*   Hemoglobin  A1C:  Basename 12/01/10 0509  HGBA1C 8.3*   Fasting Lipid Panel: No results found for this basename: CHOL,HDL,LDLCALC,TRIG,CHOLHDL,LDLDIRECT in the last 72 hours Thyroid Function Tests: No results found for this basename: TSH,T4TOTAL,FREET4,T3FREE,THYROIDAB in the last 72 hours Anemia Panel: No results found for this basename: VITAMINB12,FOLATE,FERRITIN,TIBC,IRON,RETICCTPCT in the last 72 hours    Micro: No results found for this or any previous visit (from the past 240 hour(s)).  Studies/Results: Dg Chest 2 View  11/30/2010  *RADIOLOGY REPORT*  Clinical Data: Shortness of breath.  Cough.  CHEST - 2 VIEW  Comparison: Two-view chest 03/13/2010.  Findings: The heart size is normal.  Minimal linear scarring or atelectasis is noted at the lung bases.  No focal airspace disease is evident.  The visualized soft tissues and bony thorax are unremarkable.  IMPRESSION:  1.  No acute cardiopulmonary disease. 2.  Minimal bibasilar atelectasis versus scarring.  Original Report Authenticated By: Jamesetta Orleans. MATTERN, M.D.   Ct Maxillofacial Wo Cm  11/30/2010  *RADIOLOGY REPORT*  Clinical Data: Facial pain, evaluate for sinusitis  CT MAXILLOFACIAL WITHOUT CONTRAST  Technique:  Multidetector CT imaging of the maxillofacial structures was performed. Multiplanar CT image reconstructions were also generated.  Comparison: None.  Findings: Mucosal thickening/partial opacification of the bilateral ethmoid, left sphenoid, and left maxillary sinuses.  Bilateral frontal sinuses, right maxillary sinus, and right sphenoid sinus are essentially clear.  Partial opacification of  the right mastoid air cells.  The left mastoid air cells are clear.  Visualized brain parenchyma is unremarkable.  The visualized soft tissues are within normal limits.  No evidence of maxillofacial fracture.  IMPRESSION: Mucosal sinus disease, as above.  Original Report Authenticated By: Charline Bills, M.D.    Medications: I have reviewed the  patient's current medications.  Assessment: 1. COPD with exacerbation/acute hypoxic respiratory failure. She is currently being treated with both albuterol and Atrovent nebulizations every 4 hours, high dosing of Solu-Medrol, oxygen therapy, and Avelox. She is currently hypoxic based on the oxygen saturations.  2. Acute sinusitis. Stable. On Flonase nasal spray.  3. Tobacco abuse. The patient was strongly advised to stop smoking.  4. Type 2 diabetes mellitus, uncontrolled. The patient is capillary blood glucoses elevated in part, secondary to steroid treatment. Hemoglobin A1c is 9.5.  5. Morbid obesity.  6. Hypertension. Controlled on lisinopril. Next  7. Microcytic anemia.  8. Hypokalemia, repleted.     Plan:  1. We will titrate oxygen to keep her oxygen saturations greater than 90%. 2. We will add as needed albuterol and Atrovent nebulizations for respiratory distress. 3. We will check an ABG today. If it is worrisome for both hypercapnia and hypoxia, we will move her to the ICU or step down unit. 4. We will give her an empiric gram of magnesium sulfate. 5. We'll Tussionex cough syrup for persistent cough. 7. We will order an anemia panel, TSH, free T4, for further evaluation. 8. Will add Lantus twice daily or worsening hyperglycemia and added meal treatment NovoLog. 9. Tobacco cessation counseling.  LOS: 2 days   Aanchal Cope 12/02/2010, 10:42 AM

## 2010-12-03 LAB — COMPREHENSIVE METABOLIC PANEL
ALT: 18 U/L (ref 0–35)
AST: 21 U/L (ref 0–37)
Albumin: 2.9 g/dL — ABNORMAL LOW (ref 3.5–5.2)
Alkaline Phosphatase: 53 U/L (ref 39–117)
BUN: 11 mg/dL (ref 6–23)
CO2: 23 mEq/L (ref 19–32)
Calcium: 8.9 mg/dL (ref 8.4–10.5)
Chloride: 103 mEq/L (ref 96–112)
Creatinine, Ser: 0.51 mg/dL (ref 0.50–1.10)
GFR calc Af Amer: >60 mL/min (ref >60)
GFR calc non Af Amer: >60 mL/min (ref >60)
Glucose, Bld: 292 mg/dL — ABNORMAL HIGH (ref 70–99)
Potassium: 3.8 mEq/L (ref 3.5–5.1)
Sodium: 140 mEq/L (ref 135–145)
Total Bilirubin: 0.2 mg/dL — ABNORMAL LOW (ref 0.3–1.2)
Total Protein: 7 g/dL (ref 6.0–8.3)

## 2010-12-03 LAB — RETICULOCYTES
RBC.: 4.35 MIL/uL (ref 3.87–5.11)
Retic Count, Absolute: 78.3 10*3/uL (ref 19.0–186.0)
Retic Ct Pct: 1.8 % (ref 0.4–3.1)

## 2010-12-03 LAB — URINE CULTURE
Colony Count: >=100000
Culture  Setup Time: 201208100440

## 2010-12-03 LAB — HEMOGLOBIN A1C
Hgb A1c MFr Bld: 8.6 % — ABNORMAL HIGH (ref <5.7)
Mean Plasma Glucose: 200 mg/dL — ABNORMAL HIGH (ref <117)

## 2010-12-03 LAB — FERRITIN: Ferritin: 51 ng/mL (ref 10–291)

## 2010-12-03 LAB — GLUCOSE, CAPILLARY
Glucose-Capillary: 285 mg/dL — ABNORMAL HIGH (ref 70–99)
Glucose-Capillary: 371 mg/dL — ABNORMAL HIGH (ref 70–99)
Glucose-Capillary: 305 mg/dL — ABNORMAL HIGH (ref 70–99)
Glucose-Capillary: 348 mg/dL — ABNORMAL HIGH (ref 70–99)

## 2010-12-03 LAB — IRON AND TIBC
Iron: <10 ug/dL — ABNORMAL LOW (ref 42–135)
UIBC: 292 ug/dL

## 2010-12-03 LAB — FOLATE: Folate: 6.5 ng/mL

## 2010-12-03 LAB — CBC
HCT: 31.2 % — ABNORMAL LOW (ref 36.0–46.0)
Hemoglobin: 9.1 g/dL — ABNORMAL LOW (ref 12.0–15.0)
MCH: 20.9 pg — ABNORMAL LOW (ref 26.0–34.0)
MCHC: 29.2 g/dL — ABNORMAL LOW (ref 30.0–36.0)
MCV: 71.7 fL — ABNORMAL LOW (ref 78.0–100.0)
Platelets: 344 10*3/uL (ref 150–400)
RBC: 4.35 MIL/uL (ref 3.87–5.11)
RDW: 19.6 % — ABNORMAL HIGH (ref 11.5–15.5)
WBC: 22.7 10*3/uL — ABNORMAL HIGH (ref 4.0–10.5)

## 2010-12-03 LAB — T4, FREE: Free T4: 0.96 ng/dL (ref 0.80–1.80)

## 2010-12-03 LAB — VITAMIN B12: Vitamin B-12: 263 pg/mL (ref 211–911)

## 2010-12-03 LAB — TSH: TSH: 0.244 u[IU]/mL — ABNORMAL LOW (ref 0.350–4.500)

## 2010-12-03 MED ORDER — METHYLPREDNISOLONE SODIUM SUCC 125 MG IJ SOLR
80.0000 mg | Freq: Four times a day (QID) | INTRAMUSCULAR | Status: DC
  Administered 2010-12-03 (×2): 81.25 mg via INTRAVENOUS
  Administered 2010-12-03: via INTRAVENOUS
  Administered 2010-12-04 (×3): 81.25 mg via INTRAVENOUS
  Administered 2010-12-04: 05:00:00 via INTRAVENOUS
  Administered 2010-12-05: 81.25 mg via INTRAVENOUS
  Administered 2010-12-05: 81 mg via INTRAVENOUS
  Filled 2010-12-03 (×9): qty 2

## 2010-12-03 MED ORDER — INSULIN ASPART 100 UNIT/ML ~~LOC~~ SOLN
8.0000 [IU] | Freq: Three times a day (TID) | SUBCUTANEOUS | Status: DC
  Administered 2010-12-03 – 2010-12-07 (×13): 8 [IU] via SUBCUTANEOUS

## 2010-12-03 MED ORDER — INSULIN GLARGINE 100 UNIT/ML ~~LOC~~ SOLN
20.0000 [IU] | Freq: Two times a day (BID) | SUBCUTANEOUS | Status: DC
  Administered 2010-12-03 – 2010-12-07 (×9): 20 [IU] via SUBCUTANEOUS

## 2010-12-03 NOTE — Progress Notes (Signed)
Subjective: She believes she is breathing a little better.  Objective: Vital signs in last 24 hours: Filed Vitals:   12/03/10 1000 12/03/10 1156 12/03/10 1210 12/03/10 1256  BP: 161/85     Pulse: 103  104 94  Temp: 97.7 F (36.5 C)     TempSrc: Oral     Resp: 22  22 20   Height:      Weight:      SpO2: 98% 96% 97% 98%    Intake/Output Summary (Last 24 hours) at 12/03/10 1502 Last data filed at 12/03/10 1454  Gross per 24 hour  Intake 3779.33 ml  Output   1701 ml  Net 2078.33 ml    Weight change:   General appearance: alert, cooperative and mild distress Resp: Slightly fewer crackles and wheezes bilaterally Cardio: S1 S2 w/ tachycardia Extremities: extremities normal, atraumatic, no cyanosis or edema Pulses: 2+ and symmetric  Lab Results: Basic Metabolic Panel:  Basename 12/03/10 0542 12/01/10 0509  NA 140 136  K 3.8 3.7  CL 103 99  CO2 23 22  GLUCOSE 292* 238*  BUN 11 8  CREATININE 0.51 0.56  CALCIUM 8.9 9.2  MG -- --  PHOS -- --   Liver Function Tests:  Basename 12/03/10 0542  AST 21  ALT 18  ALKPHOS 53  BILITOT 0.2*  PROT 7.0  ALBUMIN 2.9*   No results found for this basename: LIPASE:2,AMYLASE:2 in the last 72 hours CBC:  Basename 12/03/10 0542 12/01/10 0509 11/30/10 1854  WBC 22.7* 16.8* --  NEUTROABS -- -- 10.7*  HGB 9.1* 9.5* --  HCT 31.2* 31.8* --  MCV 71.7* 69.4* --  PLT 344 367 --   Cardiac Enzymes: No results found for this basename: CKTOTAL:3,CKMB:3,CKMBINDEX:3,TROPONINI:3 in the last 72 hours BNP: No results found for this basename: POCBNP:3 in the last 72 hours D-Dimer: No results found for this basename: DDIMER:2 in the last 72 hours CBG:  Basename 12/03/10 1134 12/03/10 0748 12/02/10 2145 12/02/10 1610 12/02/10 1148 12/02/10 0745  GLUCAP 371* 285* 277* 316* 385* 263*   Hemoglobin A1C:  Basename 12/01/10 0509  HGBA1C 8.3*   Fasting Lipid Panel: No results found for this basename: CHOL,HDL,LDLCALC,TRIG,CHOLHDL,LDLDIRECT  in the last 72 hours Thyroid Function Tests: No results found for this basename: TSH,T4TOTAL,FREET4,T3FREE,THYROIDAB in the last 72 hours Anemia Panel:  Basename 12/03/10 0542  VITAMINB12 --  FOLATE --  FERRITIN --  TIBC --  IRON --  RETICCTPCT 1.8      Micro: Recent Results (from the past 240 hour(s))  URINE CULTURE     Status: Normal   Collection Time   11/30/10 11:22 PM      Component Value Range Status Comment   Specimen Description URINE, CLEAN CATCH   Final    Special Requests NONE   Final    Setup Time [id number]   Final    Colony Count >=100,000 COLONIES/ML   Final    Culture     Final    Value: Multiple bacterial morphotypes present, none predominant. Suggest appropriate recollection if clinically indicated.   Report Status 12/03/2010 FINAL   Final     Studies/Results: No results found.  Medications: I have reviewed the patient's current medications.  Assessment: 1. COPD with exacerbation/acute hypoxic respiratory failure. She is currently being treated with both albuterol and Atrovent nebulizations every 4 hours, high dosing of Solu-Medrol, oxygen therapy, and Avelox. She is slightly less symptomatic and her oxygen saturations have improved. Her ABG is reviewed from yesterday. The patient may  be oxygen dependent. An ABG was indicative of hyperventilation.  2. Acute sinusitis. Stable. On Flonase nasal spray.  3. Tobacco abuse. The patient was strongly advised to stop smoking.  4. Type 2 diabetes mellitus, uncontrolled. The patient is capillary blood glucose elevated in part, secondary to steroid treatment. Hemoglobin A1c is 9.5.  5. Morbid obesity.  6. Hypertension. On lisinopril. BP is trending up, likely secondary to steroids  7. Microcytic anemia. Anemia studies are pending.  8. Hypokalemia, repleted.  9. Leukocytosis. This is secondary to steroid treatment.     Plan:  We will decrease Solu-Medrol to 80 mg every 6 hours. We will increase meal  coverage NovoLog to 8 units.  LOS: 3 days   [name] 12/03/2010, 3:02 PM

## 2010-12-04 LAB — GLUCOSE, CAPILLARY
Glucose-Capillary: 227 mg/dL — ABNORMAL HIGH (ref 70–99)
Glucose-Capillary: 276 mg/dL — ABNORMAL HIGH (ref 70–99)
Glucose-Capillary: 232 mg/dL — ABNORMAL HIGH (ref 70–99)
Glucose-Capillary: 250 mg/dL — ABNORMAL HIGH (ref 70–99)

## 2010-12-04 MED ORDER — FERROUS SULFATE 325 (65 FE) MG PO TABS
325.0000 mg | ORAL_TABLET | Freq: Two times a day (BID) | ORAL | Status: DC
  Administered 2010-12-04 – 2010-12-07 (×6): 325 mg via ORAL
  Filled 2010-12-04 (×6): qty 1

## 2010-12-04 NOTE — Plan of Care (Signed)
Problem: Phase I Progression Outcomes Goal: O2 sats > or equal 90% or at baseline Outcome: Progressing Patient weaned off of venturi mask today, currently on oxygen @ 4l

## 2010-12-04 NOTE — Progress Notes (Signed)
Pt snores extremely while sleeping, no signs of apnea but does seem to obstruct airway. Her sats tend to decrease and she also wheezes in upper airway. A sleep study may benefit her greatly.

## 2010-12-04 NOTE — Progress Notes (Signed)
Subjective: She believes she is breathing a little better.  Objective: Vital signs in last 24 hours: Filed Vitals:   12/04/10 0545 12/04/10 0743 12/04/10 0900 12/04/10 1147  BP: 134/71  155/76   Pulse: 89  91 68  Temp: 97.5 F (36.4 C)  97.3 F (36.3 C)   TempSrc: Oral  Oral   Resp: 24  20 20   Height:      Weight: 134 kg (295 lb 6.7 oz)     SpO2: 91% 5% 96% 98%    Intake/Output Summary (Last 24 hours) at 12/04/10 1309 Last data filed at 12/04/10 1200  Gross per 24 hour  Intake   2608 ml  Output   1500 ml  Net   1108 ml    Weight change:   General appearance: alert, cooperative and mild distress Resp: Fewer crackles and wheezes bilaterally. Cardio: S1 S2 w/ tachycardia Extremities: extremities normal, atraumatic, no cyanosis or edema Pulses: 2+ and symmetric  Lab Results: Basic Metabolic Panel:  Basename 12/03/10 0542  NA 140  K 3.8  CL 103  CO2 23  GLUCOSE 292*  BUN 11  CREATININE 0.51  CALCIUM 8.9  MG --  PHOS --   Liver Function Tests:  Basename 12/03/10 0542  AST 21  ALT 18  ALKPHOS 53  BILITOT 0.2*  PROT 7.0  ALBUMIN 2.9*   No results found for this basename: LIPASE:2,AMYLASE:2 in the last 72 hours CBC:  Basename 12/03/10 0542  WBC 22.7*  NEUTROABS --  HGB 9.1*  HCT 31.2*  MCV 71.7*  PLT 344   Cardiac Enzymes: No results found for this basename: CKTOTAL:3,CKMB:3,CKMBINDEX:3,TROPONINI:3 in the last 72 hours BNP: No results found for this basename: POCBNP:3 in the last 72 hours D-Dimer: No results found for this basename: DDIMER:2 in the last 72 hours CBG:  Basename 12/04/10 1131 12/04/10 0740 12/03/10 2130 12/03/10 1608 12/03/10 1134 12/03/10 0748  GLUCAP 276* 227* 348* 305* 371* 285*   Hemoglobin A1C:  Basename 12/03/10 0542  HGBA1C 8.6*   Fasting Lipid Panel: No results found for this basename: CHOL,HDL,LDLCALC,TRIG,CHOLHDL,LDLDIRECT in the last 72 hours Thyroid Function Tests:  Basename 12/03/10 0542  TSH 0.244*    T4TOTAL --  FREET4 0.96  T3FREE --  THYROIDAB --   Anemia Panel:  Basename 12/03/10 0542  VITAMINB12 263  FOLATE 6.5  FERRITIN 51  TIBC Not calculated due to Iron <10.  IRON <10*  RETICCTPCT 1.8      Micro: Recent Results (from the past 240 hour(s))  URINE CULTURE     Status: Normal   Collection Time   11/30/10 11:22 PM      Component Value Range Status Comment   Specimen Description URINE, CLEAN CATCH   Final    Special Requests NONE   Final    Setup Time [id number]   Final    Colony Count >=100,000 COLONIES/ML   Final    Culture     Final    Value: Multiple bacterial morphotypes present, none predominant. Suggest appropriate recollection if clinically indicated.   Report Status 12/03/2010 FINAL   Final     Studies/Results: No results found.  Medications: I have reviewed the patient's current medications.  Assessment: 1. COPD with exacerbation/acute hypoxic respiratory failure. She is currently being treated with both albuterol and Atrovent nebulizations every 4 hours, high dosing of Solu-Medrol, oxygen therapy, and Avelox. She is slightly less symptomatic and her oxygen saturations have improved. The patient may be oxygen dependent. Recent ABG was indicative of  hyperventilation.  2. Acute sinusitis. Stable. On Flonase nasal spray.  3. Tobacco abuse. The patient was strongly advised to stop smoking.  4. Type 2 diabetes mellitus, uncontrolled. The patient is capillary blood glucose elevated in part, secondary to steroid treatment. Hemoglobin A1c is 9.5.  5. Morbid obesity.  6. Hypertension. On lisinopril. BP is trending up, likely secondary to steroids  7. Microcytic anemia. Studies reveal iron deficiency.  8. Hypokalemia, repleted.  9. Leukocytosis. This is secondary to steroid treatment.     Plan:  Will start ferrous sulfate twice a day. We'll check another ABG and a couple of days to see if she is truly oxygen-dependent. If so, she will need oxygen  for home. Will decrease Solu-Medrol to every 8 hours in the morning.  LOS: 4 days   Dacey Milberger 12/04/2010, 1:09 PM

## 2010-12-04 NOTE — Plan of Care (Signed)
Problem: Phase I Progression Outcomes Goal: Discharge plan established Outcome: Progressing Patient for discharge home when stable.  Patient may have to be sent home with oxygen.

## 2010-12-05 ENCOUNTER — Encounter (HOSPITAL_COMMUNITY): Payer: Self-pay | Admitting: Internal Medicine

## 2010-12-05 DIAGNOSIS — K259 Gastric ulcer, unspecified as acute or chronic, without hemorrhage or perforation: Secondary | ICD-10-CM | POA: Diagnosis present

## 2010-12-05 LAB — BASIC METABOLIC PANEL
BUN: 13 mg/dL (ref 6–23)
CO2: 30 mEq/L (ref 19–32)
Calcium: 9.2 mg/dL (ref 8.4–10.5)
Chloride: 96 mEq/L (ref 96–112)
Creatinine, Ser: 0.56 mg/dL (ref 0.50–1.10)
GFR calc Af Amer: >60 mL/min (ref >60)
GFR calc non Af Amer: >60 mL/min (ref >60)
Glucose, Bld: 242 mg/dL — ABNORMAL HIGH (ref 70–99)
Potassium: 4.3 mEq/L (ref 3.5–5.1)
Sodium: 136 mEq/L (ref 135–145)

## 2010-12-05 LAB — GLUCOSE, CAPILLARY
Glucose-Capillary: 224 mg/dL — ABNORMAL HIGH (ref 70–99)
Glucose-Capillary: 384 mg/dL — ABNORMAL HIGH (ref 70–99)
Glucose-Capillary: 289 mg/dL — ABNORMAL HIGH (ref 70–99)
Glucose-Capillary: 309 mg/dL — ABNORMAL HIGH (ref 70–99)

## 2010-12-05 LAB — CBC
HCT: 32 % — ABNORMAL LOW (ref 36.0–46.0)
Hemoglobin: 9.2 g/dL — ABNORMAL LOW (ref 12.0–15.0)
MCH: 20.5 pg — ABNORMAL LOW (ref 26.0–34.0)
MCHC: 28.8 g/dL — ABNORMAL LOW (ref 30.0–36.0)
MCV: 71.4 fL — ABNORMAL LOW (ref 78.0–100.0)
Platelets: 318 10*3/uL (ref 150–400)
RBC: 4.48 MIL/uL (ref 3.87–5.11)
RDW: 18.9 % — ABNORMAL HIGH (ref 11.5–15.5)
WBC: 14.6 10*3/uL — ABNORMAL HIGH (ref 4.0–10.5)

## 2010-12-05 MED ORDER — METHYLPREDNISOLONE SODIUM SUCC 125 MG IJ SOLR
80.0000 mg | Freq: Three times a day (TID) | INTRAMUSCULAR | Status: DC
  Administered 2010-12-05 – 2010-12-06 (×3): 81.25 mg via INTRAVENOUS
  Filled 2010-12-05 (×3): qty 2

## 2010-12-05 MED ORDER — IPRATROPIUM BROMIDE 0.02 % IN SOLN
RESPIRATORY_TRACT | Status: AC
  Administered 2010-12-05: 0.5 mg via RESPIRATORY_TRACT
  Filled 2010-12-05: qty 2.5

## 2010-12-05 MED ORDER — PANTOPRAZOLE SODIUM 40 MG PO TBEC
40.0000 mg | DELAYED_RELEASE_TABLET | Freq: Every day | ORAL | Status: DC
  Administered 2010-12-05 – 2010-12-07 (×3): 40 mg via ORAL
  Filled 2010-12-05 (×3): qty 1

## 2010-12-05 MED ORDER — SODIUM CHLORIDE 0.9 % IJ SOLN
INTRAMUSCULAR | Status: AC
  Filled 2010-12-05: qty 10

## 2010-12-05 MED ORDER — IPRATROPIUM BROMIDE 0.02 % IN SOLN
RESPIRATORY_TRACT | Status: AC
  Filled 2010-12-05: qty 2.5

## 2010-12-05 NOTE — Plan of Care (Signed)
Problem: Phase I Progression Outcomes Goal: Discharge plan established Outcome: Progressing Possible discharge in 2-3 days

## 2010-12-05 NOTE — Progress Notes (Signed)
Inpatient Diabetes Program Recommendations  AACE/ADA: New Consensus Statement on Inpatient Glycemic Control (2009)  Target Ranges:  Prepandial:   less than 140 mg/dL      Peak postprandial:   less than 180 mg/dL (1-2 hours)      Critically ill patients:  140 - 180 mg/dL   Reason for Visit: Elevated glucose.  If blood sugars remain elevated, adjust insulin dosage.  Inpatient Diabetes Program Recommendations Diet: Recommend CHO modified  Note: Gave information regarding OP DM classes

## 2010-12-05 NOTE — Progress Notes (Signed)
Subjective: She believes she is breathing a little better, but becomes really short of breath when she ambulates to the bathroom.  Objective: Vital signs in last 24 hours: Filed Vitals:   12/05/10 0600 12/05/10 0852 12/05/10 0947 12/05/10 1148  BP: 156/72  120/75   Pulse: 90  92   Temp: 97.5 F (36.4 C)  97.8 F (36.6 C)   TempSrc: Oral  Oral   Resp: 20  20   Height:      Weight:      SpO2: 97% 95% 92% 94%    Intake/Output Summary (Last 24 hours) at 12/05/10 1503 Last data filed at 12/05/10 1200  Gross per 24 hour  Intake   3020 ml  Output   3900 ml  Net   -880 ml    Weight change: 0.2 kg (7.1 oz)  General appearance: alert, cooperative and mild distress Resp: Fewer crackles and wheezes bilaterally. Cardio: S1 S2 w/ tachycardia Extremities: extremities normal, atraumatic, no cyanosis or edema Pulses: 2+ and symmetric  Lab Results: Basic Metabolic Panel:  Basename 12/05/10 0454 12/03/10 0542  NA 136 140  K 4.3 3.8  CL 96 103  CO2 30 23  GLUCOSE 242* 292*  BUN 13 11  CREATININE 0.56 0.51  CALCIUM 9.2 8.9  MG -- --  PHOS -- --   Liver Function Tests:  Basename 12/03/10 0542  AST 21  ALT 18  ALKPHOS 53  BILITOT 0.2*  PROT 7.0  ALBUMIN 2.9*   No results found for this basename: LIPASE:2,AMYLASE:2 in the last 72 hours CBC:  Basename 12/05/10 0454 12/03/10 0542  WBC 14.6* 22.7*  NEUTROABS -- --  HGB 9.2* 9.1*  HCT 32.0* 31.2*  MCV 71.4* 71.7*  PLT 318 344   Cardiac Enzymes: No results found for this basename: CKTOTAL:3,CKMB:3,CKMBINDEX:3,TROPONINI:3 in the last 72 hours BNP: No results found for this basename: POCBNP:3 in the last 72 hours D-Dimer: No results found for this basename: DDIMER:2 in the last 72 hours CBG:  Basename 12/05/10 1112 12/05/10 0722 12/04/10 2119 12/04/10 1610 12/04/10 1131 12/04/10 0740  GLUCAP 384* 224* 250* 232* 276* 227*   Hemoglobin A1C:  Basename 12/03/10 0542  HGBA1C 8.6*   Fasting Lipid Panel: No results  found for this basename: CHOL,HDL,LDLCALC,TRIG,CHOLHDL,LDLDIRECT in the last 72 hours Thyroid Function Tests:  Basename 12/03/10 0542  TSH 0.244*  T4TOTAL --  FREET4 0.96  T3FREE --  THYROIDAB --   Anemia Panel:  Basename 12/03/10 0542  VITAMINB12 263  FOLATE 6.5  FERRITIN 51  TIBC Not calculated due to Iron <10.  IRON <10*  RETICCTPCT 1.8      Micro: Recent Results (from the past 240 hour(s))  URINE CULTURE     Status: Normal   Collection Time   11/30/10 11:22 PM      Component Value Range Status Comment   Specimen Description URINE, CLEAN CATCH   Final    Special Requests NONE   Final    Setup Time [id number]   Final    Colony Count >=100,000 COLONIES/ML   Final    Culture     Final    Value: Multiple bacterial morphotypes present, none predominant. Suggest appropriate recollection if clinically indicated.   Report Status 12/03/2010 FINAL   Final     Studies/Results: No results found.  Medications: I have reviewed the patient's current medications.  Assessment: 1. COPD with exacerbation/acute hypoxic respiratory failure. She is currently being treated with both albuterol and Atrovent nebulizations every 4 hours, high  dosing of Solu-Medrol, oxygen therapy, and Avelox. She is slightly less symptomatic and her oxygen saturations have improved. The patient may be oxygen dependent. Recent ABG was indicative of hyperventilation.  2. Acute sinusitis. Stable. On Flonase nasal spray.  3. Tobacco abuse. The patient was strongly advised to stop smoking.  4. Type 2 diabetes mellitus, uncontrolled. The patient is capillary blood glucose elevated in part, secondary to steroid treatment. Hemoglobin A1c is 9.5.  5. Morbid obesity.  6. Hypertension. On lisinopril. BP is trending up, likely secondary to steroids  7. Microcytic anemia. Studies reveal iron deficiency. She has a history of erosive gastritis and internal hemorrhoids per recent EGD and colonoscopy in May of 2012 by  Dr. Darrick Penna. She also has a history of heavy menstrual periods. Ferrous sulfate added twice daily.  8. Hypokalemia, repleted.  9. Leukocytosis. This is secondary to steroid treatment, some improvement.    Plan:  We'll decrease the Solu-Medrol to every 8 hours. We will check an ABG on room air tomorrow. We will discontinue Pepcid in favor of Protonix. The patient was encouraged to increase her activity level as tolerated.  LOS: 5 days   Holt Woolbright 12/05/2010, 3:03 PM

## 2010-12-05 NOTE — Progress Notes (Signed)
Pt awakened again with sever wheezing and coughing , labored breathing and ins and exp wheezing.Question sever snoring to obstructive airway during sleep, Possible gastric reflux or sinus drainage also possibilities.

## 2010-12-06 LAB — BLOOD GAS, ARTERIAL
Acid-Base Excess: 8 mmol/L — ABNORMAL HIGH (ref 0.0–2.0)
Bicarbonate: 32.2 mEq/L — ABNORMAL HIGH (ref 20.0–24.0)
FIO2: 21 %
O2 Saturation: 87.2 %
Patient temperature: 37
TCO2: 29.7 mmol/L (ref 0–100)
pCO2 arterial: 47.1 mmHg — ABNORMAL HIGH (ref 35.0–45.0)
pH, Arterial: 7.449 — ABNORMAL HIGH (ref 7.350–7.400)
pO2, Arterial: 53.8 mmHg — ABNORMAL LOW (ref 80.0–100.0)

## 2010-12-06 LAB — GLUCOSE, CAPILLARY
Glucose-Capillary: 211 mg/dL — ABNORMAL HIGH (ref 70–99)
Glucose-Capillary: 287 mg/dL — ABNORMAL HIGH (ref 70–99)
Glucose-Capillary: 303 mg/dL — ABNORMAL HIGH (ref 70–99)
Glucose-Capillary: 301 mg/dL — ABNORMAL HIGH (ref 70–99)

## 2010-12-06 MED ORDER — FLUTICASONE-SALMETEROL 250-50 MCG/DOSE IN AEPB
1.0000 | INHALATION_SPRAY | Freq: Two times a day (BID) | RESPIRATORY_TRACT | Status: DC
  Administered 2010-12-06 – 2010-12-07 (×2): 1 via RESPIRATORY_TRACT
  Filled 2010-12-06: qty 14

## 2010-12-06 MED ORDER — METHYLPREDNISOLONE SODIUM SUCC 40 MG IJ SOLR
80.0000 mg | Freq: Two times a day (BID) | INTRAMUSCULAR | Status: DC
  Administered 2010-12-07: 80 mg via INTRAVENOUS
  Filled 2010-12-06: qty 2

## 2010-12-06 MED ORDER — IPRATROPIUM BROMIDE 0.02 % IN SOLN
RESPIRATORY_TRACT | Status: AC
  Administered 2010-12-06: 0.5 mg via RESPIRATORY_TRACT
  Filled 2010-12-06: qty 2.5

## 2010-12-06 MED ORDER — FLUTICASONE-SALMETEROL 100-50 MCG/DOSE IN AEPB
INHALATION_SPRAY | RESPIRATORY_TRACT | Status: AC
  Filled 2010-12-06: qty 14

## 2010-12-06 MED ORDER — IPRATROPIUM BROMIDE 0.02 % IN SOLN
RESPIRATORY_TRACT | Status: AC
  Filled 2010-12-06: qty 2.5

## 2010-12-06 NOTE — Progress Notes (Addendum)
Subjective: She continues to improve, less wheezing.  Objective: Vital signs in last 24 hours: Filed Vitals:   12/06/10 0328 12/06/10 0600 12/06/10 0746 12/06/10 1137  BP:  140/68    Pulse:  96    Temp:  98.8 F (37.1 C)    TempSrc:      Resp:  24    Height:      Weight:      SpO2: 94% 92% 91% 97%    Intake/Output Summary (Last 24 hours) at 12/06/10 1440 Last data filed at 12/06/10 8119  Gross per 24 hour  Intake 1434.17 ml  Output   1050 ml  Net 384.17 ml    Weight change:   General appearance: alert, cooperative and mild distress Resp: Significantly fewer wheezes and crackles. Cardio: S1 S2 w/ tachycardia Extremities: extremities normal, atraumatic, no cyanosis or edema Pulses: 2+ and symmetric  Lab Results: Basic Metabolic Panel:  Basename 12/05/10 0454  NA 136  K 4.3  CL 96  CO2 30  GLUCOSE 242*  BUN 13  CREATININE 0.56  CALCIUM 9.2  MG --  PHOS --   Liver Function Tests: No results found for this basename: AST:2,ALT:2,ALKPHOS:2,BILITOT:2,PROT:2,ALBUMIN:2 in the last 72 hours No results found for this basename: LIPASE:2,AMYLASE:2 in the last 72 hours CBC:  Basename 12/05/10 0454  WBC 14.6*  NEUTROABS --  HGB 9.2*  HCT 32.0*  MCV 71.4*  PLT 318   Cardiac Enzymes: No results found for this basename: CKTOTAL:3,CKMB:3,CKMBINDEX:3,TROPONINI:3 in the last 72 hours BNP: No results found for this basename: POCBNP:3 in the last 72 hours D-Dimer: No results found for this basename: DDIMER:2 in the last 72 hours CBG:  Basename 12/06/10 1144 12/06/10 0720 12/05/10 2124 12/05/10 1635 12/05/10 1112 12/05/10 0722  GLUCAP 287* 211* 309* 289* 384* 224*   Hemoglobin A1C: No results found for this basename: HGBA1C in the last 72 hours Fasting Lipid Panel: No results found for this basename: CHOL,HDL,LDLCALC,TRIG,CHOLHDL,LDLDIRECT in the last 72 hours Thyroid Function Tests: No results found for this basename: TSH,T4TOTAL,FREET4,T3FREE,THYROIDAB in the  last 72 hours Anemia Panel: No results found for this basename: VITAMINB12,FOLATE,FERRITIN,TIBC,IRON,RETICCTPCT in the last 72 hours  ABG: On room air: PH 7.449, PCO2 47.1, P. O2 53.8.  Micro: Recent Results (from the past 240 hour(s))  URINE CULTURE     Status: Normal   Collection Time   11/30/10 11:22 PM      Component Value Range Status Comment   Specimen Description URINE, CLEAN CATCH   Final    Special Requests NONE   Final    Setup Time [id number]   Final    Colony Count >=100,000 COLONIES/ML   Final    Culture     Final    Value: Multiple bacterial morphotypes present, none predominant. Suggest appropriate recollection if clinically indicated.   Report Status 12/03/2010 FINAL   Final     Studies/Results: No results found.  Medications: I have reviewed the patient's current medications.  Assessment: 1. COPD with exacerbation/acute hypoxic respiratory failure. Symptomatically improved, but oxygen dependent now. Will start Adavir.  2. Acute sinusitis. Stable. On Flonase nasal spray.  3. Tobacco abuse. The patient was strongly advised to stop smoking.  4. Type 2 diabetes mellitus, uncontrolled. The patient is capillary blood glucose elevated in part, secondary to steroid treatment. Hemoglobin A1c is 8.6. She will need to be on insulin for home.  5. Morbid obesity.  6. Hypertension. On lisinopril. BP is trending up, likely secondary to steroids  7. Microcytic anemia.  Studies reveal iron deficiency. She has a history of erosive gastritis and internal hemorrhoids per recent EGD and colonoscopy in May of 2012 by Dr. Darrick Penna. She also has a history of heavy menstrual periods. Ferrous sulfate added twice daily.  8. Hypokalemia, repleted.  9. Leukocytosis. This is secondary to steroid treatment, some improvement.    Plan:  Would titrate Solu-Medrol. Plan for discharge tomorrow if she continues to improve symptomatically and clinically. Home health oxygen has been ordered.  Will plan to send her home on an affordable insulin:70/30 twice daily.   LOS: 6 days   [name] 12/06/2010, 2:40 PM

## 2010-12-06 NOTE — Discharge Summary (Signed)
NAMEELIZAH, [name]            ACCOUNT NO.:  [id number]  MEDICAL RECORD NO.:  [id number]  LOCATION:  A310                          FACILITY:  [facility]  PHYSICIAN:  Elliot Cousin, M.D.    DATE OF BIRTH:  12-22-1965  DATE OF ADMISSION:  11/30/2010 DATE OF DISCHARGE:  08/15/2012LH                         DISCHARGE SUMMARY-REFERRING   DIAGNOSES: 1. Acute hypoxic respiratory failure secondary to chronic obstructive     pulmonary disease with exacerbation.  The patient's ABG on room air     before discharge revealed a pH of 7.449, PCO2 of 47.1, and PO2 of     53.8. 2. Tobacco abuse.  The patient was strongly advised to stop smoking. 3. Acute sinusitis. 4. Type 2 diabetes mellitus, uncontrolled.  Her hemoglobin A1c was     8.6. 5. Morbid obesity. 6. Hypertension, elevated in part because of steroid treatment. 7. Microcytic anemia.  Her hemoglobin was 9.2 prior to discharge.  Her     vitamin B12 was 263, folate 6.5, ferritin 51, and iron less than     10.  She has a history of heavy menses.  She also has a history of     erosive gastritis and internal hemorrhoids per recent EGD and     colonoscopy respectively in May 2012 by Dr. Jonette Eva.  Her     anemia may be secondary to the previously mentioned. 8. Hypokalemia, repleted. 9. Leukocytosis, in part, secondary to steroid treatment. 10.Low TSH of 0.244, but normal free T4 of 0.96.  DISCHARGE MEDICATIONS: 1. Home oxygen 3.5 liters per minute. 2. Proventil HFA 108 two puffs every 4 hours as needed for chest     congestion and wheezing. 3. Cipro 500 mg b.i.d. for 3 more days. 4. Prednisone taper to be tapered as directed.  Prednisone 10-mg     tablets, take 6 tablets daily for 1 day starting tomorrow, then 5     tablets the next day for 1 day, then 4 tablets the next day, then 3     tablets the next day, then 2 tablets the next day, then 1 tablet     the next day, then stop. 5. Advair Diskus 250/50 one puff b.i.d. 6. Alprazolam 0.5  mg twice daily as needed for anxiety and     anxiousness. 7. 70/30 insulin 7 units subcu b.i.d. 8. Metformin 500 mg b.i.d. 9. Prilosec 20 mg daily. 10.Lisinopril 5 mg daily. 11.Percocet 5/325 mg every 4 hours as needed for pain. 12.Discontinue ibuprofen (Motrin). 13.Ferrous sulfate 325 mg 3 times daily. 14.Flonase 50 mcg 2 sprays daily.  DISCHARGE DISPOSITION:  The plan is to discharge the patient home tomorrow on December 07, 2010, if she remains in improved and stable condition.  She will follow up with her primary health provider at the [facility] on December 14, 2010, at 1:15 p.m.  PROCEDURES PERFORMED: 1. Chest x-ray on November 30, 2010.  The results revealed no acute     cardiopulmonary disease.  Minimal bibasilar atelectasis versus     scarring. 2. CT scan of the face on November 30, 2010.  The results revealed     mucosal thickening/partial opacification of the bilateral ethmoid,  left sphenoid, and left maxillary sinuses.  HISTORY OF PRESENT ILLNESS:  The patient is a 45 year old woman with a past medical history significant for type 2 diabetes mellitus, COPD, and hypertension.  She presented to the emergency department on December 01, 2010, with a chief complaint of a persistent cough and shortness of breath.  Her symptoms started with a sinus headache and postnasal drip. She took over-the-counter medications but they did not help.  When she was initially evaluated, she was noted to have significant wheezing and rhonchi on exam.  She was given several nebulizations in the emergency department, however, her symptoms did not abate.  Her chest x-ray revealed no acute cardiopulmonary disease.  She was oxygenating in the 80s on room air and with oxygen.  She was afebrile.  Her serum potassium was 3.4.  Her glucose was 147.  Her white blood cell count was 14.7. Her hemoglobin was 9.3.  She was admitted for further evaluation and management.  HOSPITAL COURSE:   As stated above, the patient received several albuterol and Atrovent nebulizations in the emergency department.  She was continued on every 4-hour dosing of both bronchodilators.  In addition, she was started on Solu-Medrol at 125 mg every 6 hours. Oxygen was applied and titrated to keep her oxygen saturations greater than 92%.  Avelox was started for treatment of infectious acute bronchitis and acute sinusitis.  A nicotine patch was placed daily.  She was advised to stop smoking.  Tobacco cessation counseling was ordered and given.  Several days following admission, she became more short of breath.  She also developed more bronchospasms.  She was given 1 g of magnesium sulfate intravenously.  An ABG was ordered and revealed hyperventilation and hypoxemia.  Xanax was prescribed for anxiousness as it was believed that this was possibly a contributing factor.  Also, the patient was started on Flonase nasal spray for additional treatment of sinusitis.  Potassium chloride was supplemented orally and in the IV fluids.  The patient's capillary blood glucose became quite elevated on steroid therapy.  She was maintained on metformin 500 mg b.i.d., however, Lantus and sliding scale NovoLog had to be added.  Both types of insulin were titrated up on almost a daily basis.  Her hemoglobin A1c was found to be 8.6.  I discussed home insulin therapy with her and she was receptive. The nursing staff was asked to instruct the patient on self-insulin administration.  The patient's blood pressure also increased.  Lisinopril was restarted. The increase in her blood pressure was thought to be secondary to steroids.  As the dosing of Solu-Medrol was titrated down, her blood pressure improved.  The patient was noted to be anemic.  An anemia panel was ordered.  The results were dictated above.  She has a history of erosive gastritis and internal hemorrhoids.  She was started on treatment with omeprazole  by Dr. Jonette Eva, her gastroenterologist.  She also reported having heavy menstrual period.  She had not been evaluated by a gynecologist. She was encouraged to [facility] to a gynecologist as needed.  She had also been prescribed iron supplementation, however, she had been noncompliant.  Ferrous sulfate was started at b.i.d. dosing, although the patient probably needs t.i.d. dosing given her severe iron deficiency anemia.  She was again encouraged to take iron on a more consistent basis.  The patient improved symptomatically.  The extent of bronchospasms and rhonchi decreased.  Solu-Medrol was  titrated downward.  Advair was added as she informed me that she had been recently prescribed Advair, but had not started using it.  Another ABG was ordered today on room air to assess if she was oxygen dependent.  The results were dictated above. Following the results, home oxygen therapy was ordered and arranged by the case manager. Throughout the hospitalization, the patient was encouraged to stop smoking permanently.  She has a strong desire to quit smoking and will try to acquire nicotine replacement therapy following discharge.     Elliot Cousin, M.D.     DF/MEDQ  D:  12/06/2010  T:  12/06/2010  Job:  [id number]  cc:   [facility] Department

## 2010-12-07 LAB — GLUCOSE, CAPILLARY
Glucose-Capillary: 148 mg/dL — ABNORMAL HIGH (ref 70–99)
Glucose-Capillary: 179 mg/dL — ABNORMAL HIGH (ref 70–99)

## 2010-12-07 MED ORDER — OXYCODONE-ACETAMINOPHEN 5-325 MG PO TABS: 1.0000 | ORAL_TABLET | Freq: Four times a day (QID) | ORAL | Status: DC | PRN

## 2010-12-07 MED ORDER — IPRATROPIUM BROMIDE 0.02 % IN SOLN
RESPIRATORY_TRACT | Status: AC
  Administered 2010-12-07: 0.5 mg via RESPIRATORY_TRACT
  Filled 2010-12-07: qty 2.5

## 2010-12-07 MED ORDER — ALBUTEROL SULFATE (5 MG/ML) 0.5% IN NEBU
INHALATION_SOLUTION | RESPIRATORY_TRACT | Status: AC
  Administered 2010-12-07: 2.5 mg via RESPIRATORY_TRACT
  Filled 2010-12-07: qty 0.5

## 2010-12-07 MED ORDER — ALBUTEROL SULFATE HFA 108 (90 BASE) MCG/ACT IN AERS: 2.0000 | INHALATION_SPRAY | RESPIRATORY_TRACT | Status: DC | PRN

## 2010-12-07 MED ORDER — FERROUS SULFATE 325 (65 FE) MG PO TABS: 325.0000 mg | ORAL_TABLET | Freq: Three times a day (TID) | ORAL | Status: DC

## 2010-12-07 MED ORDER — PREDNISONE 10 MG PO TABS: ORAL_TABLET | ORAL | Status: DC

## 2010-12-07 MED ORDER — ALPRAZOLAM 0.5 MG PO TABS: 0.5000 mg | ORAL_TABLET | Freq: Three times a day (TID) | ORAL | Status: AC | PRN

## 2010-12-07 MED ORDER — FLUTICASONE-SALMETEROL 250-50 MCG/DOSE IN AEPB: 1.0000 | INHALATION_SPRAY | Freq: Two times a day (BID) | RESPIRATORY_TRACT | Status: DC

## 2010-12-07 MED ORDER — CIPROFLOXACIN HCL 500 MG PO TABS: 500.0000 mg | ORAL_TABLET | Freq: Two times a day (BID) | ORAL | Status: AC

## 2010-12-07 MED ORDER — INSULIN NPH ISOPHANE & REGULAR (70-30) 100 UNIT/ML ~~LOC~~ SUSP: 7.0000 [IU] | Freq: Two times a day (BID) | SUBCUTANEOUS | Status: DC

## 2010-12-07 MED ORDER — FLUTICASONE PROPIONATE 50 MCG/ACT NA SUSP: 2.0000 | Freq: Every day | NASAL | Status: DC

## 2010-12-07 NOTE — Progress Notes (Signed)
Subjective: She is much better.  Objective: Vital signs in last 24 hours: Filed Vitals:   12/07/10 0421 12/07/10 0512 12/07/10 0857 12/07/10 0904  BP:  119/75    Pulse:  93    Temp:  98.3 F (36.8 C)    TempSrc:      Resp:  16    Height:      Weight:      SpO2: 95% 96% 96% 96%    Intake/Output Summary (Last 24 hours) at 12/07/10 0909 Last data filed at 12/07/10 0800  Gross per 24 hour  Intake   2620 ml  Output   1050 ml  Net   1570 ml    Weight change:   General appearance: alert, cooperative and mild distress Resp: Clear! Cardio: S1 S2  Extremities: extremities normal, atraumatic, no cyanosis or edema Pulses: 2+ and symmetric  Lab Results: Basic Metabolic Panel:  Basename 12/05/10 0454  NA 136  K 4.3  CL 96  CO2 30  GLUCOSE 242*  BUN 13  CREATININE 0.56  CALCIUM 9.2  MG --  PHOS --   Liver Function Tests: No results found for this basename: AST:2,ALT:2,ALKPHOS:2,BILITOT:2,PROT:2,ALBUMIN:2 in the last 72 hours No results found for this basename: LIPASE:2,AMYLASE:2 in the last 72 hours CBC:  Basename 12/05/10 0454  WBC 14.6*  NEUTROABS --  HGB 9.2*  HCT 32.0*  MCV 71.4*  PLT 318   Cardiac Enzymes: No results found for this basename: CKTOTAL:3,CKMB:3,CKMBINDEX:3,TROPONINI:3 in the last 72 hours BNP: No results found for this basename: POCBNP:3 in the last 72 hours D-Dimer: No results found for this basename: DDIMER:2 in the last 72 hours CBG:  Basename 12/07/10 0732 12/06/10 2132 12/06/10 1615 12/06/10 1144 12/06/10 0720 12/05/10 2124  GLUCAP 148* 301* 303* 287* 211* 309*   Hemoglobin A1C: No results found for this basename: HGBA1C in the last 72 hours Fasting Lipid Panel: No results found for this basename: CHOL,HDL,LDLCALC,TRIG,CHOLHDL,LDLDIRECT in the last 72 hours Thyroid Function Tests: No results found for this basename: TSH,T4TOTAL,FREET4,T3FREE,THYROIDAB in the last 72 hours Anemia Panel: No results found for this basename:  VITAMINB12,FOLATE,FERRITIN,TIBC,IRON,RETICCTPCT in the last 72 hours  ABG: On room air: PH 7.449, PCO2 47.1, P. O2 53.8.  Micro: Recent Results (from the past 240 hour(s))  URINE CULTURE     Status: Normal   Collection Time   11/30/10 11:22 PM      Component Value Range Status Comment   Specimen Description URINE, CLEAN CATCH   Final    Special Requests NONE   Final    Setup Time [id number]   Final    Colony Count >=100,000 COLONIES/ML   Final    Culture     Final    Value: Multiple bacterial morphotypes present, none predominant. Suggest appropriate recollection if clinically indicated.   Report Status 12/03/2010 FINAL   Final     Studies/Results: No results found.  Medications: I have reviewed the patient's current medications.  Assessment: 1. COPD with exacerbation/acute hypoxic respiratory failure. Much improved.  2. Acute sinusitis. Stable. On Flonase nasal spray.  3. Tobacco abuse. The patient was strongly advised to stop smoking.  4. Type 2 diabetes mellitus, uncontrolled. The patient is capillary blood glucose elevated in part, secondary to steroid treatment. Hemoglobin A1c is 8.6. She will need to be on insulin for home.  5. Morbid obesity.  6. Hypertension. On lisinopril.  7. Microcytic anemia. Studies reveal iron deficiency. She has a history of erosive gastritis and internal hemorrhoids per recent EGD and  colonoscopy in May of 2012 by Dr. Darrick Penna. She also has a history of heavy menstrual periods. Ferrous sulfate added twice daily.  8. Hypokalemia, repleted.  9. Leukocytosis. This is secondary to steroid treatment, some improvement.    Plan:  Discharge to home on 02, abx, steroid taper, etc. Start 70/30 insulin for home with instructions. F/up with PCP as planned.    LOS: 7 days   [name] 12/07/2010, 9:09 AM

## 2010-12-07 NOTE — Plan of Care (Signed)
Problem: Discharge Progression Outcomes Goal: Barriers To Progression Addressed/Resolved Outcome: Completed/Met Date Met:  12/07/10 Pt viewed videos on insulin administration 130,[phone]. Pt demonstated draw of insulin proper dose into syringe. Pt states she has no concerns about administering her insulin Goal: Other Discharge Outcomes/Goals Pt was read all instructions pt viewed videos [phone] she has demonstrated proper insulin administration with draw from saline vial and named injection sited discharged to home with family o2 at 3 liters [place]

## 2010-12-08 ENCOUNTER — Telehealth: Payer: Self-pay | Admitting: Gastroenterology

## 2010-12-08 ENCOUNTER — Ambulatory Visit: Payer: Self-pay | Admitting: Gastroenterology

## 2010-12-08 NOTE — Telephone Encounter (Signed)
NOTED

## 2011-01-19 LAB — BASIC METABOLIC PANEL
BUN: 12
BUN: 12
BUN: 13
BUN: 14
BUN: 15
CO2: 28
CO2: 29
CO2: 30
CO2: 32
CO2: 33 — ABNORMAL HIGH
Calcium: 8.2 — ABNORMAL LOW
Calcium: 8.3 — ABNORMAL LOW
Calcium: 8.4
Calcium: 8.6
Calcium: 8.7
Chloride: 100
Chloride: 101
Chloride: 102
Chloride: 102
Chloride: 103
Creatinine, Ser: 0.69
Creatinine, Ser: 0.69
Creatinine, Ser: 0.7
Creatinine, Ser: 0.75
Creatinine, Ser: 0.8
GFR calc Af Amer: >60
GFR calc Af Amer: >60
GFR calc Af Amer: >60
GFR calc Af Amer: >60
GFR calc Af Amer: >60
GFR calc non Af Amer: >60
GFR calc non Af Amer: >60
GFR calc non Af Amer: >60
GFR calc non Af Amer: >60
GFR calc non Af Amer: >60
Glucose, Bld: 155 — ABNORMAL HIGH
Glucose, Bld: 165 — ABNORMAL HIGH
Glucose, Bld: 178 — ABNORMAL HIGH
Glucose, Bld: 186 — ABNORMAL HIGH
Glucose, Bld: 197 — ABNORMAL HIGH
Potassium: 3.9
Potassium: 3.9
Potassium: 4.1
Potassium: 4.3
Potassium: 4.4
Sodium: 138
Sodium: 138
Sodium: 139
Sodium: 139
Sodium: 142

## 2011-01-19 LAB — DIFFERENTIAL
Basophils Absolute: 0
Basophils Absolute: 0
Basophils Absolute: 0
Basophils Absolute: 0
Basophils Absolute: 0
Basophils Absolute: 0
Basophils Absolute: 0.1
Basophils Relative: 0
Basophils Relative: 0
Basophils Relative: 0
Basophils Relative: 0
Basophils Relative: 0
Basophils Relative: 0
Basophils Relative: 1
Eosinophils Absolute: 0
Eosinophils Absolute: 0
Eosinophils Absolute: 0
Eosinophils Absolute: 0
Eosinophils Absolute: 0
Eosinophils Absolute: 0
Eosinophils Absolute: 0
Eosinophils Relative: 0
Eosinophils Relative: 0
Eosinophils Relative: 0
Eosinophils Relative: 0
Eosinophils Relative: 0
Eosinophils Relative: 0
Eosinophils Relative: 0
Lymphocytes Relative: 12
Lymphocytes Relative: 12
Lymphocytes Relative: 12
Lymphocytes Relative: 13
Lymphocytes Relative: 13
Lymphocytes Relative: 15
Lymphocytes Relative: 8 — ABNORMAL LOW
Lymphs Abs: 1
Lymphs Abs: 1.7
Lymphs Abs: 1.9
Lymphs Abs: 1.9
Lymphs Abs: 2
Lymphs Abs: 2.1
Lymphs Abs: 3
Monocytes Absolute: 0.2
Monocytes Absolute: 0.5
Monocytes Absolute: 0.8
Monocytes Absolute: 0.9
Monocytes Absolute: 1
Monocytes Absolute: 1.2 — ABNORMAL HIGH
Monocytes Absolute: 1.4 — ABNORMAL HIGH
Monocytes Relative: 2 — ABNORMAL LOW
Monocytes Relative: 4
Monocytes Relative: 5
Monocytes Relative: 6
Monocytes Relative: 6
Monocytes Relative: 7
Monocytes Relative: 7
Neutro Abs: 11.1 — ABNORMAL HIGH
Neutro Abs: 11.5 — ABNORMAL HIGH
Neutro Abs: 12 — ABNORMAL HIGH
Neutro Abs: 12.8 — ABNORMAL HIGH
Neutro Abs: 13.4 — ABNORMAL HIGH
Neutro Abs: 13.8 — ABNORMAL HIGH
Neutro Abs: 16.2 — ABNORMAL HIGH
Neutrophils Relative %: 78 — ABNORMAL HIGH
Neutrophils Relative %: 81 — ABNORMAL HIGH
Neutrophils Relative %: 81 — ABNORMAL HIGH
Neutrophils Relative %: 81 — ABNORMAL HIGH
Neutrophils Relative %: 83 — ABNORMAL HIGH
Neutrophils Relative %: 84 — ABNORMAL HIGH
Neutrophils Relative %: 90 — ABNORMAL HIGH

## 2011-01-19 LAB — COMPREHENSIVE METABOLIC PANEL
ALT: 24
AST: 20
Albumin: 3.3 — ABNORMAL LOW
Alkaline Phosphatase: 44
BUN: 7
CO2: 28
Calcium: 8.7
Chloride: 104
Creatinine, Ser: 0.83
GFR calc Af Amer: >60
GFR calc non Af Amer: >60
Glucose, Bld: 250 — ABNORMAL HIGH
Potassium: 3.2 — ABNORMAL LOW
Sodium: 140
Total Bilirubin: 0.4
Total Protein: 6.2

## 2011-01-19 LAB — CBC
HCT: 31.7 — ABNORMAL LOW
HCT: 32 — ABNORMAL LOW
HCT: 32.4 — ABNORMAL LOW
HCT: 32.5 — ABNORMAL LOW
HCT: 33.6 — ABNORMAL LOW
HCT: 33.9 — ABNORMAL LOW
HCT: 34.5 — ABNORMAL LOW
Hemoglobin: 10 — ABNORMAL LOW
Hemoglobin: 10 — ABNORMAL LOW
Hemoglobin: 10 — ABNORMAL LOW
Hemoglobin: 10.5 — ABNORMAL LOW
Hemoglobin: 10.5 — ABNORMAL LOW
Hemoglobin: 10.6 — ABNORMAL LOW
Hemoglobin: 9.8 — ABNORMAL LOW
MCHC: 30.8
MCHC: 30.8
MCHC: 30.9
MCHC: 30.9
MCHC: 31.1
MCHC: 31.1
MCHC: 31.2
MCV: 69.4 — ABNORMAL LOW
MCV: 69.6 — ABNORMAL LOW
MCV: 69.6 — ABNORMAL LOW
MCV: 69.8 — ABNORMAL LOW
MCV: 69.9 — ABNORMAL LOW
MCV: 70.3 — ABNORMAL LOW
MCV: 70.6 — ABNORMAL LOW
Platelets: 306
Platelets: 314
Platelets: 318
Platelets: 325
Platelets: 330
Platelets: 333
Platelets: 340
RBC: 4.53
RBC: 4.61
RBC: 4.62
RBC: 4.65
RBC: 4.82
RBC: 4.87
RBC: 4.88
RDW: 18.6 — ABNORMAL HIGH
RDW: 18.9 — ABNORMAL HIGH
RDW: 18.9 — ABNORMAL HIGH
RDW: 18.9 — ABNORMAL HIGH
RDW: 19.1 — ABNORMAL HIGH
RDW: 19.1 — ABNORMAL HIGH
RDW: 19.2 — ABNORMAL HIGH
WBC: 12.4 — ABNORMAL HIGH
WBC: 13.6 — ABNORMAL HIGH
WBC: 14.9 — ABNORMAL HIGH
WBC: 15.5 — ABNORMAL HIGH
WBC: 16.5 — ABNORMAL HIGH
WBC: 17 — ABNORMAL HIGH
WBC: 20.6 — ABNORMAL HIGH

## 2011-01-19 LAB — CULTURE, BLOOD (ROUTINE X 2)
Culture: NO GROWTH
Culture: NO GROWTH
Report Status: 7112009
Report Status: 7112009

## 2011-01-19 LAB — BLOOD GAS, ARTERIAL
Acid-Base Excess: 0.2
Bicarbonate: 23.8
O2 Content: 21
O2 Saturation: 95.2
Patient temperature: 37
TCO2: 21.9
pCO2 arterial: 34.5 — ABNORMAL LOW
pH, Arterial: 7.453 — ABNORMAL HIGH
pO2, Arterial: 73.6 — ABNORMAL LOW

## 2011-01-19 LAB — IRON AND TIBC
Iron: <10 — ABNORMAL LOW
UIBC: 304

## 2011-01-19 LAB — URINALYSIS, ROUTINE W REFLEX MICROSCOPIC
Bilirubin Urine: NEGATIVE
Glucose, UA: 100 — AB
Ketones, ur: NEGATIVE
Leukocytes, UA: NEGATIVE
Nitrite: NEGATIVE
Protein, ur: NEGATIVE
Specific Gravity, Urine: 1.02
Urobilinogen, UA: 0.2
pH: 6

## 2011-01-19 LAB — RAPID URINE DRUG SCREEN, HOSP PERFORMED
Amphetamines: NOT DETECTED
Barbiturates: NOT DETECTED
Benzodiazepines: NOT DETECTED
Cocaine: NOT DETECTED
Opiates: POSITIVE — AB
Tetrahydrocannabinol: POSITIVE — AB

## 2011-01-19 LAB — URINE MICROSCOPIC-ADD ON

## 2011-01-19 LAB — PHOSPHORUS: Phosphorus: 2.5

## 2011-01-19 LAB — CK TOTAL AND CKMB (NOT AT ARMC)
CK, MB: 0.7
Relative Index: INVALID
Total CK: 31

## 2011-01-19 LAB — HEMOGLOBIN A1C
Hgb A1c MFr Bld: 6.5 — ABNORMAL HIGH
Mean Plasma Glucose: 154

## 2011-01-19 LAB — PREGNANCY, URINE: Preg Test, Ur: NEGATIVE

## 2011-01-19 LAB — MAGNESIUM: Magnesium: 2

## 2011-01-19 LAB — VITAMIN B12: Vitamin B-12: 228 (ref 211–911)

## 2011-01-19 LAB — TSH: TSH: 0.756 (ref 0.350–4.500)

## 2011-01-19 LAB — FERRITIN: Ferritin: 30 (ref 10–291)

## 2011-01-19 LAB — TROPONIN I: Troponin I: 0.01

## 2011-02-02 ENCOUNTER — Emergency Department (HOSPITAL_COMMUNITY): Payer: Medicaid Other

## 2011-02-02 ENCOUNTER — Encounter (HOSPITAL_COMMUNITY): Payer: Self-pay | Admitting: *Deleted

## 2011-02-02 ENCOUNTER — Emergency Department (HOSPITAL_COMMUNITY)
Admission: EM | Admit: 2011-02-02 | Discharge: 2011-02-03 | Disposition: A | Discharge status: Home / Self Care | Payer: Medicaid Other | Attending: Emergency Medicine | Admitting: Emergency Medicine

## 2011-02-02 ENCOUNTER — Other Ambulatory Visit: Payer: Self-pay

## 2011-02-02 DIAGNOSIS — F17200 Nicotine dependence, unspecified, uncomplicated: Secondary | ICD-10-CM | POA: Insufficient documentation

## 2011-02-02 DIAGNOSIS — J45909 Unspecified asthma, uncomplicated: Secondary | ICD-10-CM | POA: Insufficient documentation

## 2011-02-02 DIAGNOSIS — R0789 Other chest pain: Secondary | ICD-10-CM

## 2011-02-02 DIAGNOSIS — E119 Type 2 diabetes mellitus without complications: Secondary | ICD-10-CM | POA: Insufficient documentation

## 2011-02-02 DIAGNOSIS — I498 Other specified cardiac arrhythmias: Secondary | ICD-10-CM | POA: Insufficient documentation

## 2011-02-02 DIAGNOSIS — Z79899 Other long term (current) drug therapy: Secondary | ICD-10-CM | POA: Insufficient documentation

## 2011-02-02 DIAGNOSIS — I10 Essential (primary) hypertension: Secondary | ICD-10-CM | POA: Insufficient documentation

## 2011-02-02 DIAGNOSIS — Z794 Long term (current) use of insulin: Secondary | ICD-10-CM | POA: Insufficient documentation

## 2011-02-02 DIAGNOSIS — R071 Chest pain on breathing: Secondary | ICD-10-CM | POA: Insufficient documentation

## 2011-02-02 DIAGNOSIS — J449 Chronic obstructive pulmonary disease, unspecified: Secondary | ICD-10-CM | POA: Insufficient documentation

## 2011-02-02 DIAGNOSIS — J4489 Other specified chronic obstructive pulmonary disease: Secondary | ICD-10-CM | POA: Insufficient documentation

## 2011-02-02 LAB — CARDIAC PANEL(CRET KIN+CKTOT+MB+TROPI)
CK, MB: 2 ng/mL (ref 0.3–4.0)
Relative Index: INVALID (ref 0.0–2.5)
Total CK: 31 U/L (ref 7–177)
Troponin I: <0.3 ng/mL (ref <0.30)

## 2011-02-02 LAB — CBC
HCT: 31.5 % — ABNORMAL LOW (ref 36.0–46.0)
Hemoglobin: 9.1 g/dL — ABNORMAL LOW (ref 12.0–15.0)
MCH: 21.1 pg — ABNORMAL LOW (ref 26.0–34.0)
MCHC: 28.9 g/dL — ABNORMAL LOW (ref 30.0–36.0)
MCV: 73.1 fL — ABNORMAL LOW (ref 78.0–100.0)
Platelets: 294 10*3/uL (ref 150–400)
RBC: 4.31 MIL/uL (ref 3.87–5.11)
RDW: 19.2 % — ABNORMAL HIGH (ref 11.5–15.5)
WBC: 10.6 10*3/uL — ABNORMAL HIGH (ref 4.0–10.5)

## 2011-02-02 LAB — COMPREHENSIVE METABOLIC PANEL
ALT: 8 U/L (ref 0–35)
AST: 9 U/L (ref 0–37)
Albumin: 3.2 g/dL — ABNORMAL LOW (ref 3.5–5.2)
Alkaline Phosphatase: 49 U/L (ref 39–117)
BUN: 8 mg/dL (ref 6–23)
CO2: 31 mEq/L (ref 19–32)
Calcium: 8.8 mg/dL (ref 8.4–10.5)
Chloride: 101 mEq/L (ref 96–112)
Creatinine, Ser: 0.6 mg/dL (ref 0.50–1.10)
GFR calc Af Amer: >90 mL/min (ref >90)
GFR calc non Af Amer: >90 mL/min (ref >90)
Glucose, Bld: 127 mg/dL — ABNORMAL HIGH (ref 70–99)
Potassium: 3.9 mEq/L (ref 3.5–5.1)
Sodium: 139 mEq/L (ref 135–145)
Total Bilirubin: 0.1 mg/dL — ABNORMAL LOW (ref 0.3–1.2)
Total Protein: 6.4 g/dL (ref 6.0–8.3)

## 2011-02-02 LAB — DIFFERENTIAL
Basophils Absolute: 0 10*3/uL (ref 0.0–0.1)
Basophils Relative: 0 % (ref 0–1)
Eosinophils Absolute: 0.2 10*3/uL (ref 0.0–0.7)
Eosinophils Relative: 2 % (ref 0–5)
Lymphocytes Relative: 37 % (ref 12–46)
Lymphs Abs: 3.9 10*3/uL (ref 0.7–4.0)
Monocytes Absolute: 0.8 10*3/uL (ref 0.1–1.0)
Monocytes Relative: 8 % (ref 3–12)
Neutro Abs: 5.7 10*3/uL (ref 1.7–7.7)
Neutrophils Relative %: 53 % (ref 43–77)

## 2011-02-02 MED ORDER — OXYCODONE-ACETAMINOPHEN 5-325 MG PO TABS
1.0000 | ORAL_TABLET | Freq: Once | ORAL | Status: AC
  Administered 2011-02-02: 1 via ORAL
  Filled 2011-02-02: qty 1

## 2011-02-02 MED ORDER — IBUPROFEN 800 MG PO TABS
800.0000 mg | ORAL_TABLET | Freq: Once | ORAL | Status: AC
  Administered 2011-02-02: 800 mg via ORAL
  Filled 2011-02-02: qty 1

## 2011-02-02 NOTE — ED Provider Notes (Signed)
History     CSN: [id number] Arrival date & time: 02/02/2011  9:57 PM  Chief Complaint  Patient presents with  . Chest Pain    (Consider location/radiation/quality/duration/timing/severity/associated sxs/prior treatment) HPI Comments: Seen 2315.  Patient is a 45 y.o. female presenting with chest pain. The history is provided by the patient.  Chest Pain The chest pain began 6 - 12 hours ago (began earlier today as sharp stabbing pains and with aching pain in between. Worse with breathing and palpation.). Chest pain occurs constantly. The pain is associated with breathing. At its most intense, the pain is at 6/10. The severity of the pain is moderate. The quality of the pain is described as aching, sharp and similar to previous episodes. The pain does not radiate.     Past Medical History  Diagnosis Date  . Diabetes mellitus   . COPD (chronic obstructive pulmonary disease)   . HTN (hypertension)   . Low back pain   . Tachycardia     never had test done since no insurance  . Depression   . Asthma   . Shortness of breath   . Anxiety   . Gastric erosions     EGD 08/2010.  . Internal hemorrhoids     Colonoscopy 5/12.  [name] Heavy menses     Past Surgical History  Procedure Date  . Cholecystectomy 1990  . Cesarean section     twice  . Kidney surgery     as child for blockages  . Tubal ligation   . Tonsillectomy   . Wrist surgery 1995    Lt wrist    Family History  Problem Relation Age of Onset  . Heart attack Father 69    deceased, etoh use  . Heart attack Mother 106    deceased  . Diabetes Mother   . Breast cancer Mother   . Heart failure Mother     oxygen dependence, nonsmoker  . Colon cancer Neg Hx   . Liver disease Maternal Aunt 40    died while on liver transplant list  . Heart attack Maternal Grandmother     premature CAD  . Ulcers Sister     History  Substance Use Topics  . Smoking status: Current Everyday Smoker -- 1.5 packs/day for 30 years   Types: Cigarettes  . Smokeless tobacco: Not on file  . Alcohol Use: No    OB History    Grav Para Term Preterm Abortions TAB SAB Ect Mult Living                  Review of Systems  Cardiovascular: Positive for chest pain.  All other systems reviewed and are negative.    Allergies  Codeine  Home Medications   Current Outpatient Rx  Name Route Sig Dispense Refill  . FLUTICASONE PROPIONATE 50 MCG/ACT NA SUSP Nasal Place 2 sprays into the nose daily.    [name] FLUTICASONE-SALMETEROL 250-50 MCG/DOSE IN AEPB Inhalation Inhale 1 puff into the lungs 2 (two) times daily.    [name] GLUCOTROL PO Oral Take 1 tablet by mouth daily.      . IBUPROFEN 200 MG PO TABS Oral Take 200-400 mg by mouth daily as needed. For pain     . LISINOPRIL 5 MG PO TABS Oral Take 5 mg by mouth daily.      [name] METFORMIN HCL 500 MG PO TABS Oral Take 500 mg by mouth 2 (two) times daily with a meal.     . ALBUTEROL SULFATE  HFA 108 (90 BASE) MCG/ACT IN AERS Inhalation Inhale 2 puffs into the lungs every 4 (four) hours as needed for wheezing or shortness of breath. Wheezing/shortnes of breath    . FERROUS SULFATE 325 (65 FE) MG PO TABS Oral Take 1 tablet (325 mg total) by mouth 3 (three) times daily with meals. IRON SUPPLEMENT.    . INSULIN ISOPHANE & REGULAR (70-30) 100 UNIT/ML [place] SUSP Subcutaneous Inject 7 Units into the skin 2 (two) times daily with a meal. 10 mL 12  . OMEPRAZOLE 20 MG PO CPDR Oral Take 20 mg by mouth daily.      . OXYCODONE-ACETAMINOPHEN 5-325 MG PO TABS Oral Take 1 tablet by mouth every 6 (six) hours as needed for pain. 15 tablet 0  . PREDNISONE 10 MG PO TABS  TAKE 6 TABLETS DAILY FOR 1 DAY STARTING TOMORROW; THEN 5 TABLETS THE NEXT DAY; THEN 4 TABLETS THE NEXT DAY; THEN 3 TABLETS THE NEXT DAY; THEN 2 TABLETS THE NEXT DAY; THEN 1 TABLET THE NEXT DAY; THEN STOP. 21 tablet 0    BP 133/69  Pulse 103  Temp 99 F (37.2 C)  Resp 23  Ht 5' 4.5" (1.638 m)  Wt 285 lb (129.275 kg)  BMI 48.16 kg/m2  SpO2 99%   LMP 01/29/2011  Physical Exam  Nursing note and vitals reviewed. Constitutional: She is oriented to person, place, and time. She appears well-developed and well-nourished. No distress.  HENT:  Head: Normocephalic.  Right Ear: External ear normal.  Nose: Nose normal.  Mouth/Throat: Oropharynx is clear and moist.  Eyes: EOM are normal.  Neck: Normal range of motion. Neck supple.  Cardiovascular: Normal rate, normal heart sounds and intact distal pulses.   Pulmonary/Chest: Effort normal and breath sounds normal. No respiratory distress. She has no wheezes. She has no rales. She exhibits tenderness.       Left sided chest wall tenderness with palpation  Abdominal: Soft. Bowel sounds are normal.  Musculoskeletal: Normal range of motion.  Neurological: She is alert and oriented to person, place, and time. She has normal reflexes. No cranial nerve deficit. Coordination normal.  Skin: Skin is warm and dry.    ED Course  Procedures (including critical care time) Results for orders placed during the hospital encounter of 02/02/11  CARDIAC PANEL(CRET KIN+CKTOT+MB+TROPI)      Component Value Range   Total CK 31  7 - 177 (U/L)   CK, MB 2.0  0.3 - 4.0 (ng/mL)   Troponin I <0.30  <0.30 (ng/mL)   Relative Index RELATIVE INDEX IS INVALID  0.0 - 2.5   CBC      Component Value Range   WBC 10.6 (*) 4.0 - 10.5 (K/uL)   RBC 4.31  3.87 - 5.11 (MIL/uL)   Hemoglobin 9.1 (*) 12.0 - 15.0 (g/dL)   HCT 40.9 (*) 81.1 - 46.0 (%)   MCV 73.1 (*) 78.0 - 100.0 (fL)   MCH 21.1 (*) 26.0 - 34.0 (pg)   MCHC 28.9 (*) 30.0 - 36.0 (g/dL)   RDW 91.4 (*) 78.2 - 15.5 (%)   Platelets 294  150 - 400 (K/uL)  DIFFERENTIAL      Component Value Range   Neutrophils Relative 53  43 - 77 (%)   Lymphocytes Relative 37  12 - 46 (%)   Monocytes Relative 8  3 - 12 (%)   Eosinophils Relative 2  0 - 5 (%)   Basophils Relative 0  0 - 1 (%)   Neutro Abs  5.7  1.7 - 7.7 (K/uL)   Lymphs Abs 3.9  0.7 - 4.0 (K/uL)   Monocytes  Absolute 0.8  0.1 - 1.0 (K/uL)   Eosinophils Absolute 0.2  0.0 - 0.7 (K/uL)   Basophils Absolute 0.0  0.0 - 0.1 (K/uL)   RBC Morphology POLYCHROMASIA PRESENT    COMPREHENSIVE METABOLIC PANEL      Component Value Range   Sodium 139  135 - 145 (mEq/L)   Potassium 3.9  3.5 - 5.1 (mEq/L)   Chloride 101  96 - 112 (mEq/L)   CO2 31  19 - 32 (mEq/L)   Glucose, Bld 127 (*) 70 - 99 (mg/dL)   BUN 8  6 - 23 (mg/dL)   Creatinine, Ser 1.61  0.50 - 1.10 (mg/dL)   Calcium 8.8  8.4 - 09.6 (mg/dL)   Total Protein 6.4  6.0 - 8.3 (g/dL)   Albumin 3.2 (*) 3.5 - 5.2 (g/dL)   AST 9  0 - 37 (U/L)   ALT 8  0 - 35 (U/L)   Alkaline Phosphatase 49  39 - 117 (U/L)   Total Bilirubin 0.1 (*) 0.3 - 1.2 (mg/dL)   GFR calc non Af Amer >90  >90 (mL/min)   GFR calc Af Amer >90  >90 (mL/min)   Date: 02/02/2011  2158  Rate: 111  Rhythm: sinus tachycardia  QRS Axis: normal  Intervals: normal  ST/T Wave abnormalities: normal  Conduction Disutrbances:none  Narrative Interpretation: low voltage QRS  Old EKG Reviewed: none available   Dg Chest Portable 1 View  02/02/2011  *RADIOLOGY REPORT*  Clinical Data: Chest pain.  PORTABLE CHEST - 1 VIEW 02/02/2011 2239 hours:  Comparison: Two-view chest x-ray 11/30/2010, 03/13/2010, and 10/28/2007.  Findings: Cardiac silhouette normal in size for the [facility] portable technique, unchanged.  Hilar and mediastinal contours unremarkable. Lungs clear.  No pleural effusions.  IMPRESSION: No acute cardiopulmonary disease.  Original Report Authenticated By: Arnell Sieving, M.D.   Patient with left sided anterior chest wall pain today that is worse with palpation and movement. EKG, chest xray and labs are unremarkable. Patient has a chronic anemia with hgb in 9 range, unchanged. Given analgesics in the ER with some relief.Pt stable in ED with no significant deterioration in condition.Pt feels improved after observation and/or treatment in ED.The patient appears reasonably screened and/or  stabilized for discharge and I doubt any other medical condition or other [facility] requiring further screening, evaluation, or treatment in the ED at this time prior to discharge. MDM Reviewed: nursing note and vitals Interpretation: labs, ECG and x-ray             Nicoletta Dress. Colon Branch, MD 02/03/11 [phone]

## 2011-02-02 NOTE — ED Notes (Signed)
Chest pain , nausea, bil arm pain, with numbness

## 2011-02-03 MED ORDER — OXYCODONE-ACETAMINOPHEN 5-325 MG PO TABS: 1.0000 | ORAL_TABLET | Freq: Four times a day (QID) | ORAL | Status: DC | PRN

## 2011-02-03 MED ORDER — IBUPROFEN 800 MG PO TABS: 800.0000 mg | ORAL_TABLET | Freq: Three times a day (TID) | ORAL | Status: AC

## 2011-02-03 NOTE — ED Notes (Signed)
Pt left the er stating no needs 

## 2011-03-20 ENCOUNTER — Inpatient Hospital Stay (HOSPITAL_COMMUNITY)
Admission: EM | Admit: 2011-03-20 | Discharge: 2011-03-27 | DRG: 193 | Disposition: A | Discharge status: Home / Self Care | Payer: Medicaid Other | Attending: Internal Medicine | Admitting: Internal Medicine

## 2011-03-20 ENCOUNTER — Encounter (HOSPITAL_COMMUNITY): Payer: Self-pay | Admitting: Emergency Medicine

## 2011-03-20 ENCOUNTER — Emergency Department (HOSPITAL_COMMUNITY): Payer: Medicaid Other

## 2011-03-20 DIAGNOSIS — F17210 Nicotine dependence, cigarettes, uncomplicated: Secondary | ICD-10-CM | POA: Diagnosis present

## 2011-03-20 DIAGNOSIS — F329 Major depressive disorder, single episode, unspecified: Secondary | ICD-10-CM | POA: Diagnosis present

## 2011-03-20 DIAGNOSIS — E876 Hypokalemia: Secondary | ICD-10-CM | POA: Diagnosis present

## 2011-03-20 DIAGNOSIS — E669 Obesity, unspecified: Secondary | ICD-10-CM | POA: Diagnosis present

## 2011-03-20 DIAGNOSIS — I10 Essential (primary) hypertension: Secondary | ICD-10-CM | POA: Diagnosis present

## 2011-03-20 DIAGNOSIS — F3289 Other specified depressive episodes: Secondary | ICD-10-CM | POA: Diagnosis present

## 2011-03-20 DIAGNOSIS — E1142 Type 2 diabetes mellitus with diabetic polyneuropathy: Secondary | ICD-10-CM | POA: Diagnosis present

## 2011-03-20 DIAGNOSIS — J441 Chronic obstructive pulmonary disease with (acute) exacerbation: Secondary | ICD-10-CM | POA: Diagnosis present

## 2011-03-20 DIAGNOSIS — IMO0001 Reserved for inherently not codable concepts without codable children: Secondary | ICD-10-CM | POA: Diagnosis present

## 2011-03-20 DIAGNOSIS — E871 Hypo-osmolality and hyponatremia: Secondary | ICD-10-CM | POA: Diagnosis present

## 2011-03-20 DIAGNOSIS — E119 Type 2 diabetes mellitus without complications: Secondary | ICD-10-CM | POA: Diagnosis present

## 2011-03-20 DIAGNOSIS — R000 Tachycardia, unspecified: Secondary | ICD-10-CM | POA: Diagnosis present

## 2011-03-20 DIAGNOSIS — I498 Other specified cardiac arrhythmias: Secondary | ICD-10-CM | POA: Diagnosis present

## 2011-03-20 DIAGNOSIS — J189 Pneumonia, unspecified organism: Principal | ICD-10-CM | POA: Diagnosis present

## 2011-03-20 DIAGNOSIS — J9600 Acute respiratory failure, unspecified whether with hypoxia or hypercapnia: Secondary | ICD-10-CM | POA: Diagnosis present

## 2011-03-20 DIAGNOSIS — J9691 Respiratory failure, unspecified with hypoxia: Secondary | ICD-10-CM | POA: Diagnosis present

## 2011-03-20 DIAGNOSIS — E1159 Type 2 diabetes mellitus with other circulatory complications: Secondary | ICD-10-CM | POA: Diagnosis present

## 2011-03-20 LAB — DIFFERENTIAL
Basophils Absolute: 0 10*3/uL (ref 0.0–0.1)
Basophils Relative: 0 % (ref 0–1)
Eosinophils Absolute: 0 10*3/uL (ref 0.0–0.7)
Eosinophils Relative: 0 % (ref 0–5)
Lymphocytes Relative: 9 % — ABNORMAL LOW (ref 12–46)
Lymphs Abs: 2 10*3/uL (ref 0.7–4.0)
Monocytes Absolute: 1.2 10*3/uL — ABNORMAL HIGH (ref 0.1–1.0)
Monocytes Relative: 5 % (ref 3–12)
Neutro Abs: 19.5 10*3/uL — ABNORMAL HIGH (ref 1.7–7.7)
Neutrophils Relative %: 86 % — ABNORMAL HIGH (ref 43–77)

## 2011-03-20 LAB — CBC
HCT: 32.5 % — ABNORMAL LOW (ref 36.0–46.0)
Hemoglobin: 9.2 g/dL — ABNORMAL LOW (ref 12.0–15.0)
MCH: 20.2 pg — ABNORMAL LOW (ref 26.0–34.0)
MCHC: 28.3 g/dL — ABNORMAL LOW (ref 30.0–36.0)
MCV: 71.4 fL — ABNORMAL LOW (ref 78.0–100.0)
Platelets: 350 10*3/uL (ref 150–400)
RBC: 4.55 MIL/uL (ref 3.87–5.11)
RDW: 17.7 % — ABNORMAL HIGH (ref 11.5–15.5)
WBC: 22.7 10*3/uL — ABNORMAL HIGH (ref 4.0–10.5)

## 2011-03-20 LAB — BASIC METABOLIC PANEL
BUN: 5 mg/dL — ABNORMAL LOW (ref 6–23)
CO2: 28 mEq/L (ref 19–32)
Calcium: 9.2 mg/dL (ref 8.4–10.5)
Chloride: 91 mEq/L — ABNORMAL LOW (ref 96–112)
Creatinine, Ser: 0.69 mg/dL (ref 0.50–1.10)
GFR calc Af Amer: >90 mL/min (ref >90)
GFR calc non Af Amer: >90 mL/min (ref >90)
Glucose, Bld: 217 mg/dL — ABNORMAL HIGH (ref 70–99)
Potassium: 3.2 mEq/L — ABNORMAL LOW (ref 3.5–5.1)
Sodium: 131 mEq/L — ABNORMAL LOW (ref 135–145)

## 2011-03-20 LAB — URINE MICROSCOPIC-ADD ON

## 2011-03-20 LAB — GLUCOSE, CAPILLARY: Glucose-Capillary: 217 mg/dL — ABNORMAL HIGH (ref 70–99)

## 2011-03-20 LAB — URINALYSIS, ROUTINE W REFLEX MICROSCOPIC
Glucose, UA: NEGATIVE mg/dL
Ketones, ur: 40 mg/dL — AB
Leukocytes, UA: NEGATIVE
Nitrite: NEGATIVE
Protein, ur: 30 mg/dL — AB
Specific Gravity, Urine: 1.02 (ref 1.005–1.030)
Urobilinogen, UA: 0.2 mg/dL (ref 0.0–1.0)
pH: 6 (ref 5.0–8.0)

## 2011-03-20 LAB — INFLUENZA PANEL BY PCR (TYPE A & B)
H1N1 flu by pcr: NOT DETECTED
Influenza A By PCR: NEGATIVE
Influenza B By PCR: NEGATIVE

## 2011-03-20 LAB — LACTIC ACID, PLASMA: Lactic Acid, Venous: 2.5 mmol/L — ABNORMAL HIGH (ref 0.5–2.2)

## 2011-03-20 MED ORDER — ALBUTEROL SULFATE (5 MG/ML) 0.5% IN NEBU
5.0000 mg | INHALATION_SOLUTION | Freq: Once | RESPIRATORY_TRACT | Status: AC
  Administered 2011-03-20: 5 mg via RESPIRATORY_TRACT
  Filled 2011-03-20: qty 1

## 2011-03-20 MED ORDER — MOXIFLOXACIN HCL IN NACL 400 MG/250ML IV SOLN
400.0000 mg | Freq: Once | INTRAVENOUS | Status: AC
  Administered 2011-03-20: 400 mg via INTRAVENOUS
  Filled 2011-03-20: qty 250

## 2011-03-20 MED ORDER — IPRATROPIUM BROMIDE 0.02 % IN SOLN
0.5000 mg | Freq: Once | RESPIRATORY_TRACT | Status: AC
  Administered 2011-03-20: 0.5 mg via RESPIRATORY_TRACT
  Filled 2011-03-20: qty 2.5

## 2011-03-20 MED ORDER — ACETAMINOPHEN 325 MG PO TABS
650.0000 mg | ORAL_TABLET | Freq: Once | ORAL | Status: AC
  Administered 2011-03-20: 650 mg via ORAL
  Filled 2011-03-20: qty 2

## 2011-03-20 NOTE — ED Provider Notes (Addendum)
Scribed for Sheila Baker, MD, the patient was seen in room [facility]14[facility]14 . This chart was scribed by Ellie Lunch.   CSN: [id number] Arrival date & time: 03/20/2011  8:44 PM   First MD Initiated Contact with Patient 03/20/11 2102      Chief Complaint  Patient presents with  . Shortness of Breath  . COPD    (Consider location/radiation/quality/duration/timing/severity/associated sxs/prior treatment) Patient is a 45 y.o. female presenting with shortness of breath.  Shortness of Breath  The current episode started 3 to 5 days ago. The onset was gradual. The problem occurs continuously. The problem has been gradually worsening. The problem is moderate. The symptoms are relieved by nothing. The symptoms are aggravated by activity. Associated symptoms include shortness of breath.   [name] is a 45 y.o. female with a history of COPD and brought in by EMS, presents to the Emergency Department complaining of SOB that has been progressively worsening for the past three days.  She reports experiencing chest tightness/pain while coughing and sometimes while breathing.  She says she has had flu-like sx of productive cough with yellow phlegm, palpitations, post tussive emesis,watery diarrhea, and congestion for the past three days. Pt denies bloody stool  Pt has a hx of diabetes.  Pt uses supplemental O2 at home.    Past Medical History  Diagnosis Date  . Diabetes mellitus   . COPD (chronic obstructive pulmonary disease)   . HTN (hypertension)   . Low back pain   . Tachycardia     never had test done since no insurance  . Depression   . Asthma   . Shortness of breath   . Anxiety   . Gastric erosions     EGD 08/2010.  . Internal hemorrhoids     Colonoscopy 5/12.  [name] Heavy menses     Past Surgical History  Procedure Date  . Cholecystectomy 1990  . Cesarean section     twice  . Kidney surgery     as child for blockages  . Tubal ligation   . Tonsillectomy   . Wrist surgery  1995    Lt wrist    Family History  Problem Relation Age of Onset  . Heart attack Father 42    deceased, etoh use  . Heart attack Mother 75    deceased  . Diabetes Mother   . Breast cancer Mother   . Heart failure Mother     oxygen dependence, nonsmoker  . Colon cancer Neg Hx   . Liver disease Maternal Aunt 40    died while on liver transplant list  . Heart attack Maternal Grandmother     premature CAD  . Ulcers Sister     History  Substance Use Topics  . Smoking status: Current Everyday Smoker -- 1.5 packs/day for 30 years    Types: Cigarettes  . Smokeless tobacco: Not on file  . Alcohol Use: No    Review of Systems  Respiratory: Positive for shortness of breath.    10 Systems reviewed and are negative for acute change except as noted in the HPI.  Allergies  Codeine  Home Medications   Current Outpatient Rx  Name Route Sig Dispense Refill  . ALBUTEROL SULFATE HFA 108 (90 BASE) MCG/ACT IN AERS Inhalation Inhale 2 puffs into the lungs every 4 (four) hours as needed for wheezing or shortness of breath. Wheezing/shortnes of breath    . FERROUS SULFATE 325 (65 FE) MG PO TABS Oral Take 1 tablet (  325 mg total) by mouth 3 (three) times daily with meals. IRON SUPPLEMENT.    [name] FLUTICASONE PROPIONATE 50 MCG/ACT NA SUSP Nasal Place 2 sprays into the nose daily.    [name] FLUTICASONE-SALMETEROL 250-50 MCG/DOSE IN AEPB Inhalation Inhale 1 puff into the lungs 2 (two) times daily.    [name] GLUCOTROL PO Oral Take 1 tablet by mouth daily.      . IBUPROFEN 200 MG PO TABS Oral Take 200-400 mg by mouth daily as needed. For pain     . INSULIN ISOPHANE & REGULAR (70-30) 100 UNIT/ML  SUSP Subcutaneous Inject 7 Units into the skin 2 (two) times daily with a meal. 10 mL 12  . LISINOPRIL 5 MG PO TABS Oral Take 5 mg by mouth daily.      [name] METFORMIN HCL 500 MG PO TABS Oral Take 500 mg by mouth 2 (two) times daily with a meal.     . OMEPRAZOLE 20 MG PO CPDR Oral Take 20 mg by mouth daily.      .  OXYCODONE-ACETAMINOPHEN 5-325 MG PO TABS Oral Take 1 tablet by mouth every 6 (six) hours as needed for pain. 15 tablet 0  . PREDNISONE 10 MG PO TABS  TAKE 6 TABLETS DAILY FOR 1 DAY STARTING TOMORROW; THEN 5 TABLETS THE NEXT DAY; THEN 4 TABLETS THE NEXT DAY; THEN 3 TABLETS THE NEXT DAY; THEN 2 TABLETS THE NEXT DAY; THEN 1 TABLET THE NEXT DAY; THEN STOP. 21 tablet 0    BP 118/70  Pulse 145  Temp(Src) 103.1 F (39.5 C) (Rectal)  Resp 28  Ht 5\' 4"  (1.626 m)  Wt 290 lb (131.543 kg)  BMI 49.78 kg/m2  SpO2 91%  LMP 02/27/2011  Physical Exam  Nursing note and vitals reviewed. Constitutional: She is oriented to person, place, and time. She appears well-developed and well-nourished.  HENT:  Head: Normocephalic and atraumatic.  Mouth/Throat: Oropharynx is clear and moist.       Right tm erythematous  with clear fluid cyst on R tympanic membrane, left TM normal  Eyes: Pupils are equal, round, and reactive to light. No scleral icterus.  Neck: Normal range of motion. Neck supple.  Cardiovascular: Regular rhythm.        tachycardic  Pulmonary/Chest: She has wheezes.       Rhonchi bilaterally, expiratory wheezing  Abdominal: Soft. There is no tenderness.  Musculoskeletal: Normal range of motion. She exhibits edema.       1 + edema bilateral extremities   Neurological: She is alert and oriented to person, place, and time.  Skin: Skin is warm and dry.    ED Course  Procedures (including critical care time)  DIAGNOSTIC STUDIES: Oxygen Saturation is 91% on 3.5L [place], adequate by my interpretation.    COORDINATION OF CARE:   Labs Reviewed  CBC - Abnormal; Notable for the following:    WBC 22.7 (*)    Hemoglobin 9.2 (*)    HCT 32.5 (*)    MCV 71.4 (*)    MCH 20.2 (*)    MCHC 28.3 (*)    RDW 17.7 (*)    All other components within normal limits  DIFFERENTIAL - Abnormal; Notable for the following:    Neutrophils Relative 86 (*)    Neutro Abs 19.5 (*)    Lymphocytes Relative 9 (*)     Monocytes Absolute 1.2 (*)    All other components within normal limits  BASIC METABOLIC PANEL - Abnormal; Notable for the following:  Sodium 131 (*)    Potassium 3.2 (*)    Chloride 91 (*)    Glucose, Bld 217 (*)    BUN 5 (*)    All other components within normal limits  LACTIC ACID, PLASMA - Abnormal; Notable for the following:    Lactic Acid, Venous 2.5 (*)    All other components within normal limits  GLUCOSE, CAPILLARY - Abnormal; Notable for the following:    Glucose-Capillary 217 (*)    All other components within normal limits  CULTURE, BLOOD (ROUTINE X 2)  CULTURE, BLOOD (ROUTINE X 2)  POCT CBG MONITORING  INFLUENZA PANEL BY PCR  URINALYSIS, ROUTINE W REFLEX MICROSCOPIC  URINE CULTURE   Dg Chest Portable 1 View  03/20/2011  *RADIOLOGY REPORT*  Clinical Data: Cough, congestion, shortness of breath and fever; history of smoking.  PORTABLE CHEST - 1 VIEW  Comparison: Chest radiograph performed 02/02/2011  Findings: The lungs are well-aerated.  Mild left basilar and right midlung airspace opacities raise concern for multifocal pneumonia. Underlying vascular congestion is noted; the appearance is atypical for interstitial edema.  No definite pleural effusion or pneumothorax is seen.  The cardiomediastinal silhouette remains normal in size.  No acute osseous abnormalities are identified.  IMPRESSION:  1.  Mild left basilar and right lung airspace opacities raise concern for multifocal pneumonia. 2.  Underlying vascular congestion noted.  Original Report Authenticated By: Tonia Ghent, M.D.   ED MEDICATIONS  Medications  moxifloxacin (AVELOX) IVPB 400 mg   acetaminophen (TYLENOL) tablet 650 mg (650 mg Oral Given 03/20/11 2107)    No diagnosis found.   MDM  Pt started on abx for pneumonia, given breathing tx, breathing improved, spoke with triad, will admit to stepdown  CRITICAL CARE Performed by: [name]   Total critical care time: 75  Critical care time was  exclusive of separately billable procedures and treating other patients.  Critical care was necessary to treat or prevent imminent or life-threatening deterioration.  Critical care was time spent personally by me on the following activities: development of treatment plan with patient and/or surrogate as well as nursing, discussions with consultants, evaluation of patient's response to treatment, examination of patient, obtaining history from patient or surrogate, ordering and performing treatments and interventions, ordering and review of laboratory studies, ordering and review of radiographic studies, pulse oximetry and re-evaluation of patient's condition.   I personally performed the services described in this documentation, which was scribed in my presence. The recorded information has been reviewed and considered.    Date: 03/20/2011  Rate: 146  Rhythm: sinus tachycardia  QRS Axis: normal  Intervals: normal  ST/T Wave abnormalities: nonspecific ST/T changes  Conduction Disutrbances:none  Narrative Interpretation:   Old EKG Reviewed: none available       Sheila Baker, MD 03/20/11 2344  Sheila Baker, MD 03/20/11 2358

## 2011-03-20 NOTE — ED Notes (Signed)
Received duo neb atrovent 0.5 and albuterol 2.5 and 125mg  solumedrol iv en route. Pt c/o prod cough that has been getting worse x 3 days with yellow phlegm. On home 02 3.5L , continued in ED.  Pt has congested breathing and cough noticed. Pt states she coughs so hard she has vomited. Pt having tachypnea. Advised to slow breathing down. Comfort measures given. Alert/oriented. C/o some dizziness.

## 2011-03-20 NOTE — ED Notes (Signed)
PT PLACED ON CARDIAC MONITOR

## 2011-03-21 ENCOUNTER — Encounter (HOSPITAL_COMMUNITY): Payer: Self-pay | Admitting: Internal Medicine

## 2011-03-21 DIAGNOSIS — J189 Pneumonia, unspecified organism: Secondary | ICD-10-CM | POA: Diagnosis present

## 2011-03-21 DIAGNOSIS — E871 Hypo-osmolality and hyponatremia: Secondary | ICD-10-CM | POA: Diagnosis present

## 2011-03-21 DIAGNOSIS — R000 Tachycardia, unspecified: Secondary | ICD-10-CM | POA: Diagnosis present

## 2011-03-21 LAB — GLUCOSE, CAPILLARY
Glucose-Capillary: 241 mg/dL — ABNORMAL HIGH (ref 70–99)
Glucose-Capillary: 266 mg/dL — ABNORMAL HIGH (ref 70–99)
Glucose-Capillary: 227 mg/dL — ABNORMAL HIGH (ref 70–99)
Glucose-Capillary: 259 mg/dL — ABNORMAL HIGH (ref 70–99)

## 2011-03-21 LAB — RETICULOCYTES
RBC.: 4.45 MIL/uL (ref 3.87–5.11)
Retic Count, Absolute: 62.3 10*3/uL (ref 19.0–186.0)
Retic Ct Pct: 1.4 % (ref 0.4–3.1)

## 2011-03-21 LAB — BLOOD GAS, ARTERIAL
Acid-base deficit: 0.2 mmol/L (ref 0.0–2.0)
Acid-base deficit: 0.7 mmol/L (ref 0.0–2.0)
Bicarbonate: 23.9 mEq/L (ref 20.0–24.0)
Bicarbonate: 23.8 mEq/L (ref 20.0–24.0)
Delivery systems: POSITIVE
Expiratory PAP: 8
FIO2: 1 %
FIO2: 100 %
Inspiratory PAP: 12
O2 Content: 100 L/min
O2 Saturation: 89.3 %
O2 Saturation: 99.3 %
Patient temperature: 37
Patient temperature: 37
TCO2: 22.2 mmol/L (ref 0–100)
TCO2: 22.2 mmol/L (ref 0–100)
pCO2 arterial: 38.5 mmHg (ref 35.0–45.0)
pCO2 arterial: 41.3 mmHg (ref 35.0–45.0)
pH, Arterial: 7.409 — ABNORMAL HIGH (ref 7.350–7.400)
pH, Arterial: 7.378 (ref 7.350–7.400)
pO2, Arterial: 60.2 mmHg — ABNORMAL LOW (ref 80.0–100.0)
pO2, Arterial: 194 mmHg — ABNORMAL HIGH (ref 80.0–100.0)

## 2011-03-21 LAB — CBC
HCT: 31.6 % — ABNORMAL LOW (ref 36.0–46.0)
Hemoglobin: 9.1 g/dL — ABNORMAL LOW (ref 12.0–15.0)
MCH: 20.4 pg — ABNORMAL LOW (ref 26.0–34.0)
MCHC: 28.8 g/dL — ABNORMAL LOW (ref 30.0–36.0)
MCV: 71 fL — ABNORMAL LOW (ref 78.0–100.0)
Platelets: 343 10*3/uL (ref 150–400)
RBC: 4.45 MIL/uL (ref 3.87–5.11)
RDW: 17.8 % — ABNORMAL HIGH (ref 11.5–15.5)
WBC: 26.4 10*3/uL — ABNORMAL HIGH (ref 4.0–10.5)

## 2011-03-21 LAB — IRON AND TIBC
Iron: 11 ug/dL — ABNORMAL LOW (ref 42–135)
Saturation Ratios: 3 % — ABNORMAL LOW (ref 20–55)
TIBC: 323 ug/dL (ref 250–470)
UIBC: 312 ug/dL (ref 125–400)

## 2011-03-21 LAB — VITAMIN B12: Vitamin B-12: 294 pg/mL (ref 211–911)

## 2011-03-21 LAB — COMPREHENSIVE METABOLIC PANEL
ALT: 11 U/L (ref 0–35)
AST: 11 U/L (ref 0–37)
Albumin: 3.1 g/dL — ABNORMAL LOW (ref 3.5–5.2)
Alkaline Phosphatase: 67 U/L (ref 39–117)
BUN: 8 mg/dL (ref 6–23)
CO2: 26 mEq/L (ref 19–32)
Calcium: 9.4 mg/dL (ref 8.4–10.5)
Chloride: 96 mEq/L (ref 96–112)
Creatinine, Ser: 0.6 mg/dL (ref 0.50–1.10)
GFR calc Af Amer: >90 mL/min (ref >90)
GFR calc non Af Amer: >90 mL/min (ref >90)
Glucose, Bld: 271 mg/dL — ABNORMAL HIGH (ref 70–99)
Potassium: 3.4 mEq/L — ABNORMAL LOW (ref 3.5–5.1)
Sodium: 135 mEq/L (ref 135–145)
Total Bilirubin: 0.3 mg/dL (ref 0.3–1.2)
Total Protein: 7.9 g/dL (ref 6.0–8.3)

## 2011-03-21 LAB — HEMOGLOBIN A1C
Hgb A1c MFr Bld: 7.1 % — ABNORMAL HIGH (ref <5.7)
Mean Plasma Glucose: 157 mg/dL — ABNORMAL HIGH (ref <117)

## 2011-03-21 LAB — TSH: TSH: 1.6 u[IU]/mL (ref 0.350–4.500)

## 2011-03-21 LAB — FOLATE: Folate: 5.1 ng/mL

## 2011-03-21 LAB — CARDIAC PANEL(CRET KIN+CKTOT+MB+TROPI)
CK, MB: 2 ng/mL (ref 0.3–4.0)
CK, MB: 2.3 ng/mL (ref 0.3–4.0)
Relative Index: INVALID (ref 0.0–2.5)
Relative Index: INVALID (ref 0.0–2.5)
Total CK: 51 U/L (ref 7–177)
Total CK: 49 U/L (ref 7–177)
Troponin I: <0.3 ng/mL (ref <0.30)
Troponin I: <0.3 ng/mL (ref <0.30)

## 2011-03-21 LAB — FERRITIN: Ferritin: 102 ng/mL (ref 10–291)

## 2011-03-21 LAB — MRSA PCR SCREENING: MRSA by PCR: NEGATIVE

## 2011-03-21 MED ORDER — FLUTICASONE PROPIONATE 50 MCG/ACT NA SUSP
2.0000 | Freq: Every day | NASAL | Status: DC
Start: 2011-03-21 — End: 2011-03-27
  Administered 2011-03-21 – 2011-03-26 (×5): 2 via NASAL
  Filled 2011-03-21: qty 16

## 2011-03-21 MED ORDER — LEVALBUTEROL HCL 0.63 MG/3ML IN NEBU
INHALATION_SOLUTION | RESPIRATORY_TRACT | Status: AC
  Administered 2011-03-21: 0.63 mg via RESPIRATORY_TRACT
  Filled 2011-03-21: qty 3

## 2011-03-21 MED ORDER — GLIPIZIDE 5 MG PO TABS: 10.0000 mg | ORAL_TABLET | Freq: Every day | ORAL | Status: DC

## 2011-03-21 MED ORDER — MORPHINE SULFATE 4 MG/ML IJ SOLN
2.0000 mg | INTRAMUSCULAR | Status: DC | PRN
  Administered 2011-03-21: 2 mg via INTRAVENOUS
  Filled 2011-03-21 (×2): qty 1

## 2011-03-21 MED ORDER — ENOXAPARIN SODIUM 40 MG/0.4ML ~~LOC~~ SOLN
40.0000 mg | Freq: Every day | SUBCUTANEOUS | Status: DC
  Administered 2011-03-21 – 2011-03-26 (×6): 40 mg via SUBCUTANEOUS
  Filled 2011-03-21 (×7): qty 0.4

## 2011-03-21 MED ORDER — LEVALBUTEROL HCL 0.63 MG/3ML IN NEBU
0.6300 mg | INHALATION_SOLUTION | RESPIRATORY_TRACT | Status: DC | PRN
  Administered 2011-03-21 – 2011-03-26 (×3): 0.63 mg via RESPIRATORY_TRACT
  Filled 2011-03-21 (×3): qty 3

## 2011-03-21 MED ORDER — ACETAMINOPHEN 325 MG PO TABS
650.0000 mg | ORAL_TABLET | Freq: Four times a day (QID) | ORAL | Status: DC | PRN
  Administered 2011-03-23: 650 mg via ORAL
  Filled 2011-03-21: qty 2

## 2011-03-21 MED ORDER — POTASSIUM CHLORIDE CRYS ER 20 MEQ PO TBCR
EXTENDED_RELEASE_TABLET | ORAL | Status: AC
  Administered 2011-03-21: 40 meq via ORAL
  Filled 2011-03-21: qty 1

## 2011-03-21 MED ORDER — INSULIN ASPART 100 UNIT/ML ~~LOC~~ SOLN
0.0000 [IU] | Freq: Every day | SUBCUTANEOUS | Status: DC
  Administered 2011-03-21 – 2011-03-22 (×2): 3 [IU] via SUBCUTANEOUS
  Administered 2011-03-23 – 2011-03-24 (×2): 5 [IU] via SUBCUTANEOUS
  Administered 2011-03-25: 3 [IU] via SUBCUTANEOUS
  Filled 2011-03-21: qty 3

## 2011-03-21 MED ORDER — ACETAMINOPHEN 650 MG RE SUPP: 650.0000 mg | Freq: Four times a day (QID) | RECTAL | Status: DC | PRN

## 2011-03-21 MED ORDER — NICOTINE 14 MG/24HR TD PT24
14.0000 mg | MEDICATED_PATCH | Freq: Every day | TRANSDERMAL | Status: DC
  Administered 2011-03-21 – 2011-03-27 (×7): 14 mg via TRANSDERMAL
  Filled 2011-03-21 (×11): qty 1

## 2011-03-21 MED ORDER — POTASSIUM CHLORIDE CRYS ER 20 MEQ PO TBCR
EXTENDED_RELEASE_TABLET | ORAL | Status: AC
  Filled 2011-03-21: qty 1

## 2011-03-21 MED ORDER — MORPHINE SULFATE 4 MG/ML IJ SOLN
2.0000 mg | INTRAMUSCULAR | Status: DC | PRN
  Administered 2011-03-21 – 2011-03-27 (×16): 2 mg via INTRAVENOUS
  Filled 2011-03-21 (×16): qty 1

## 2011-03-21 MED ORDER — BIOTENE DRY MOUTH MT LIQD
15.0000 mL | Freq: Two times a day (BID) | OROMUCOSAL | Status: DC
  Administered 2011-03-21 – 2011-03-26 (×8): 15 mL via OROMUCOSAL

## 2011-03-21 MED ORDER — LISINOPRIL 5 MG PO TABS
5.0000 mg | ORAL_TABLET | Freq: Every day | ORAL | Status: DC
  Administered 2011-03-21 – 2011-03-27 (×7): 5 mg via ORAL
  Filled 2011-03-21 (×7): qty 1

## 2011-03-21 MED ORDER — FLUTICASONE-SALMETEROL 250-50 MCG/DOSE IN AEPB
1.0000 | INHALATION_SPRAY | Freq: Two times a day (BID) | RESPIRATORY_TRACT | Status: DC
  Administered 2011-03-22: 1 via RESPIRATORY_TRACT
  Filled 2011-03-21: qty 14

## 2011-03-21 MED ORDER — ONDANSETRON HCL 4 MG/2ML IJ SOLN
4.0000 mg | Freq: Four times a day (QID) | INTRAMUSCULAR | Status: DC | PRN
  Administered 2011-03-26 – 2011-03-27 (×2): 4 mg via INTRAVENOUS
  Filled 2011-03-21 (×2): qty 2

## 2011-03-21 MED ORDER — METHYLPREDNISOLONE SODIUM SUCC 125 MG IJ SOLR
125.0000 mg | Freq: Four times a day (QID) | INTRAMUSCULAR | Status: DC
  Administered 2011-03-21 – 2011-03-25 (×16): 125 mg via INTRAVENOUS
  Filled 2011-03-21 (×16): qty 2

## 2011-03-21 MED ORDER — MOXIFLOXACIN HCL IN NACL 400 MG/250ML IV SOLN
400.0000 mg | INTRAVENOUS | Status: DC
  Administered 2011-03-21: 400 mg via INTRAVENOUS
  Filled 2011-03-21: qty 250

## 2011-03-21 MED ORDER — DEXTROSE 5 % IV SOLN
1.0000 g | INTRAVENOUS | Status: DC
  Administered 2011-03-22 – 2011-03-24 (×3): 1 g via INTRAVENOUS
  Filled 2011-03-21 (×6): qty 10

## 2011-03-21 MED ORDER — ONDANSETRON HCL 4 MG PO TABS: 4.0000 mg | ORAL_TABLET | Freq: Four times a day (QID) | ORAL | Status: DC | PRN

## 2011-03-21 MED ORDER — LEVALBUTEROL HCL 0.63 MG/3ML IN NEBU
0.6300 mg | INHALATION_SOLUTION | Freq: Four times a day (QID) | RESPIRATORY_TRACT | Status: DC
  Administered 2011-03-21 – 2011-03-27 (×24): 0.63 mg via RESPIRATORY_TRACT
  Filled 2011-03-21 (×22): qty 3

## 2011-03-21 MED ORDER — THERA M PLUS PO TABS
1.0000 | ORAL_TABLET | Freq: Every day | ORAL | Status: DC
  Administered 2011-03-21 – 2011-03-27 (×7): 1 via ORAL
  Filled 2011-03-21 (×8): qty 1

## 2011-03-21 MED ORDER — OXYCODONE HCL 5 MG PO TABS
5.0000 mg | ORAL_TABLET | ORAL | Status: DC | PRN
  Administered 2011-03-21 – 2011-03-27 (×18): 5 mg via ORAL
  Filled 2011-03-21 (×17): qty 1

## 2011-03-21 MED ORDER — DEXTROSE 5 % IV SOLN
2.0000 g | Freq: Once | INTRAVENOUS | Status: AC
  Administered 2011-03-21: 2 g via INTRAVENOUS
  Filled 2011-03-21: qty 2

## 2011-03-21 MED ORDER — INSULIN ASPART 100 UNIT/ML ~~LOC~~ SOLN
0.0000 [IU] | Freq: Three times a day (TID) | SUBCUTANEOUS | Status: DC
  Administered 2011-03-21: 5 [IU] via SUBCUTANEOUS
  Administered 2011-03-21: 8 [IU] via SUBCUTANEOUS
  Administered 2011-03-21: 5 [IU] via SUBCUTANEOUS
  Administered 2011-03-22: 15 [IU] via SUBCUTANEOUS
  Administered 2011-03-22: 5 [IU] via SUBCUTANEOUS
  Administered 2011-03-22: 8 [IU] via SUBCUTANEOUS
  Administered 2011-03-23: 11 [IU] via SUBCUTANEOUS
  Administered 2011-03-23: 5 [IU] via SUBCUTANEOUS
  Administered 2011-03-23: 8 [IU] via SUBCUTANEOUS
  Administered 2011-03-24: 15 [IU] via SUBCUTANEOUS
  Administered 2011-03-24: 8 [IU] via SUBCUTANEOUS
  Administered 2011-03-24: 15 [IU] via SUBCUTANEOUS
  Administered 2011-03-25: 5 [IU] via SUBCUTANEOUS
  Administered 2011-03-25: 8 [IU] via SUBCUTANEOUS
  Administered 2011-03-25: 11 [IU] via SUBCUTANEOUS
  Administered 2011-03-26: 8 [IU] via SUBCUTANEOUS
  Administered 2011-03-26 (×2): 5 [IU] via SUBCUTANEOUS
  Filled 2011-03-21: qty 3

## 2011-03-21 MED ORDER — LORAZEPAM 2 MG/ML IJ SOLN
1.0000 mg | INTRAMUSCULAR | Status: DC | PRN
  Administered 2011-03-21 – 2011-03-27 (×15): 1 mg via INTRAVENOUS
  Filled 2011-03-21 (×17): qty 1

## 2011-03-21 MED ORDER — DEXTROSE 5 % IV SOLN
500.0000 mg | INTRAVENOUS | Status: DC
  Administered 2011-03-21 – 2011-03-24 (×4): 500 mg via INTRAVENOUS
  Filled 2011-03-21 (×7): qty 500

## 2011-03-21 MED ORDER — CHLORHEXIDINE GLUCONATE 0.12 % MT SOLN
15.0000 mL | Freq: Two times a day (BID) | OROMUCOSAL | Status: DC
  Administered 2011-03-21 – 2011-03-25 (×8): 15 mL via OROMUCOSAL
  Filled 2011-03-21 (×7): qty 15

## 2011-03-21 MED ORDER — OXYCODONE HCL 5 MG PO TABS
ORAL_TABLET | ORAL | Status: AC
  Administered 2011-03-21: 5 mg via ORAL
  Filled 2011-03-21: qty 1

## 2011-03-21 MED ORDER — SODIUM CHLORIDE 0.9 % IV SOLN
INTRAVENOUS | Status: DC
  Administered 2011-03-21: 100 mL via INTRAVENOUS
  Administered 2011-03-21 – 2011-03-23 (×4): via INTRAVENOUS
  Administered 2011-03-23 (×2): 50 mL/h via INTRAVENOUS
  Administered 2011-03-25: 17:00:00 via INTRAVENOUS

## 2011-03-21 MED ORDER — POTASSIUM CHLORIDE CRYS ER 20 MEQ PO TBCR
40.0000 meq | EXTENDED_RELEASE_TABLET | Freq: Once | ORAL | Status: AC
  Administered 2011-03-21: 40 meq via ORAL

## 2011-03-21 NOTE — ED Notes (Signed)
Pt states her back is sore and is ready to go upstairs.  Report called and pt ready to transport.

## 2011-03-21 NOTE — H&P (Signed)
[name] is an 45 y.o. female.    PCP: Vertis Kelch, NP, NP   Chief Complaint: Shortness of breath for 3 days  HPI: This is a 45 year old, Caucasian female, with a past medical history of diabetes, COPD, hypertension, obesity, who continues to smoke, who presents with a 3 to 4 day history of worsening shortness of breath. She's also had a cough with yellowish expectoration. She has occasionally seen blood in the sputum. She's had shallow respirations for the last few days. She's tried taking inhalers with no relief. She's had fever and chills. She's been sweating a lot. The symptoms continued to get worse, so, she called EMS. She also has chest pain, especially with the breathing. Denies any nausea, vomiting. She's had some post tussive emesis. She's had loose stools today. Denies any abdominal pain. Denies any leg swelling.   Prior to Admission medications   Medication Sig Start Date End Date Taking? Authorizing Provider  albuterol (PROVENTIL HFA) 108 (90 BASE) MCG/ACT inhaler Inhale 2 puffs into the lungs every 4 (four) hours as needed for wheezing or shortness of breath. Wheezing/shortnes of breath 12/07/10  Yes Denise Fisher  Fluticasone Propionate, Inhal, (FLOVENT DISKUS) 100 MCG/BLIST AEPB Inhale 1 puff into the lungs 2 (two) times daily. *Rinse mouth with water after each use*    Yes Historical Provider, MD  Fluticasone-Salmeterol (ADVAIR) 250-50 MCG/DOSE AEPB Inhale 1 puff into the lungs 2 (two) times daily. 12/07/10  Yes Denise Fisher  glipiZIDE (GLUCOTROL) 10 MG tablet Take 10 mg by mouth daily.     Yes Historical Provider, MD  lisinopril (PRINIVIL,ZESTRIL) 5 MG tablet Take 5 mg by mouth daily.     Yes Historical Provider, MD  metFORMIN (GLUCOPHAGE) 500 MG tablet Take 500 mg by mouth 2 (two) times daily with a meal.    Yes Historical Provider, MD  Pseudoeph-Doxylamine-DM-APAP (DAYQUIL/NYQUIL COLD/FLU RELIEF PO) Take 2 capsules by mouth as needed. For cold and flu symptoms    Yes  Historical Provider, MD  fluticasone (FLONASE) 50 MCG/ACT nasal spray Place 2 sprays into the nose daily. 12/07/10 12/07/11  Elliot Cousin  ibuprofen (ADVIL,MOTRIN) 200 MG tablet Take 200-400 mg by mouth daily as needed. For pain     Historical Provider, MD    Allergies:  Allergies  Allergen Reactions  . Codeine Nausea Only    Past Medical History  Diagnosis Date  . Diabetes mellitus   . COPD (chronic obstructive pulmonary disease)   . HTN (hypertension)   . Low back pain   . Tachycardia     never had test done since no insurance  . Depression   . Asthma   . Shortness of breath   . Anxiety   . Gastric erosions     EGD 08/2010.  . Internal hemorrhoids     Colonoscopy 5/12.  [name] Heavy menses     Past Surgical History  Procedure Date  . Cholecystectomy 1990  . Cesarean section     twice  . Kidney surgery     as child for blockages  . Tubal ligation   . Tonsillectomy   . Wrist surgery 1995    Lt wrist    Social History:  reports that she has been smoking Cigarettes.  She has a 30 pack-year smoking history. She does not have any smokeless tobacco history on file. She reports that she does not drink alcohol or use illicit drugs.  Family History:  Family History  Problem Relation Age of Onset  . Heart attack  Father 53    deceased, etoh use  . Heart attack Mother 24    deceased  . Diabetes Mother   . Breast cancer Mother   . Heart failure Mother     oxygen dependence, nonsmoker  . Colon cancer Neg Hx   . Liver disease Maternal Aunt 40    died while on liver transplant list  . Heart attack Maternal Grandmother     premature CAD  . Ulcers Sister     Review of Systems - History obtained from the patient General ROS: positive for  - malaise Psychological ROS: negative ENT ROS: negative Allergy and Immunology ROS: negative Respiratory ROS: As in HPI Cardiovascular ROS: As in HPI Gastrointestinal ROS: no abdominal pain, change in bowel habits, or black or bloody  stools Genito-Urinary ROS: no dysuria, trouble voiding, or hematuria Musculoskeletal ROS: negative Neurological ROS: negative Dermatological ROS: negative  Physical Examination Blood pressure 120/64, pulse 106, temperature 98.9 F (37.2 C), temperature source Oral, resp. rate 24, height 5\' 4"  (1.626 m), weight 131.543 kg (290 lb), last menstrual period 02/27/2011, SpO2 94.00%.  General appearance: alert, cooperative, no distress and moderately obese Head: Normocephalic, without obvious abnormality, atraumatic Eyes: conjunctivae/corneas clear. PERRL, EOM's intact. Fundi benign. Nose: Nares normal. Septum midline. Mucosa normal. No drainage or sinus tenderness. Throat: lips, mucosa, and tongue normal; teeth and gums normal Neck: no adenopathy, no carotid bruit, no JVD, supple, symmetrical, trachea midline and thyroid not enlarged, symmetric, no tenderness/mass/nodules Resp: Decreased air entry bilaterally. Few crackles at bases. Wheezing present diffusely. Cardio: regular rate and rhythm, S1, S2 normal, no murmur, click, rub or gallop GI: soft, non-tender; bowel sounds normal; no masses,  no organomegaly Extremities: extremities normal, atraumatic, no cyanosis or edema Pulses: 2+ and symmetric Skin: Skin color, texture, turgor normal. No rashes or lesions Lymph nodes: Cervical, supraclavicular, and axillary nodes normal. Neurologic: Alert and oriented X 3, normal strength and tone. Normal symmetric reflexes. Normal coordination and gait  Results for orders placed during the hospital encounter of 03/20/11 (from the past 48 hour(s))  GLUCOSE, CAPILLARY     Status: Abnormal   Collection Time   03/20/11  9:18 PM      Component Value Range Comment   Glucose-Capillary 217 (*) 70 - 99 (mg/dL)   INFLUENZA PANEL BY PCR     Status: Normal   Collection Time   03/20/11  9:27 PM      Component Value Range Comment   Influenza A By PCR NEGATIVE  NEGATIVE     Influenza B By PCR NEGATIVE  NEGATIVE      H1N1 flu by pcr NOT DETECTED  NOT DETECTED    CBC     Status: Abnormal   Collection Time   03/20/11  9:35 PM      Component Value Range Comment   WBC 22.7 (*) 4.0 - 10.5 (K/uL)    RBC 4.55  3.87 - 5.11 (MIL/uL)    Hemoglobin 9.2 (*) 12.0 - 15.0 (g/dL)    HCT 82.9 (*) 56.2 - 46.0 (%)    MCV 71.4 (*) 78.0 - 100.0 (fL)    MCH 20.2 (*) 26.0 - 34.0 (pg)    MCHC 28.3 (*) 30.0 - 36.0 (g/dL)    RDW 13.0 (*) 86.5 - 15.5 (%)    Platelets 350  150 - 400 (K/uL)   DIFFERENTIAL     Status: Abnormal   Collection Time   03/20/11  9:35 PM      Component Value Range  Comment   Neutrophils Relative 86 (*) 43 - 77 (%)    Neutro Abs 19.5 (*) 1.7 - 7.7 (K/uL)    Lymphocytes Relative 9 (*) 12 - 46 (%)    Lymphs Abs 2.0  0.7 - 4.0 (K/uL)    Monocytes Relative 5  3 - 12 (%)    Monocytes Absolute 1.2 (*) 0.1 - 1.0 (K/uL)    Eosinophils Relative 0  0 - 5 (%)    Eosinophils Absolute 0.0  0.0 - 0.7 (K/uL)    Basophils Relative 0  0 - 1 (%)    Basophils Absolute 0.0  0.0 - 0.1 (K/uL)   BASIC METABOLIC PANEL     Status: Abnormal   Collection Time   03/20/11  9:35 PM      Component Value Range Comment   Sodium 131 (*) 135 - 145 (mEq/L)    Potassium 3.2 (*) 3.5 - 5.1 (mEq/L)    Chloride 91 (*) 96 - 112 (mEq/L)    CO2 28  19 - 32 (mEq/L)    Glucose, Bld 217 (*) 70 - 99 (mg/dL)    BUN 5 (*) 6 - 23 (mg/dL)    Creatinine, Ser 5.64  0.50 - 1.10 (mg/dL)    Calcium 9.2  8.4 - 10.5 (mg/dL)    GFR calc non Af Amer >90  >90 (mL/min)    GFR calc Af Amer >90  >90 (mL/min)   CULTURE, BLOOD (ROUTINE X 2)     Status: Normal (Preliminary result)   Collection Time   03/20/11  9:35 PM      Component Value Range Comment   Specimen Description BLOOD LEFT HAND      Special Requests BOTTLES DRAWN AEROBIC AND ANAEROBIC 6CC      Culture PENDING      Report Status PENDING     CULTURE, BLOOD (ROUTINE X 2)     Status: Normal (Preliminary result)   Collection Time   03/20/11  9:35 PM      Component Value Range Comment    Specimen Description RIGHT ANTECUBITAL      Special Requests BOTTLES DRAWN AEROBIC AND ANAEROBIC 6CC EACH      Culture PENDING      Report Status PENDING     LACTIC ACID, PLASMA     Status: Abnormal   Collection Time   03/20/11  9:35 PM      Component Value Range Comment   Lactic Acid, Venous 2.5 (*) 0.5 - 2.2 (mmol/L)   URINALYSIS, ROUTINE W REFLEX MICROSCOPIC     Status: Abnormal   Collection Time   03/20/11 10:57 PM      Component Value Range Comment   Color, Urine AMBER (*) YELLOW  BIOCHEMICALS MAY BE AFFECTED BY COLOR   Appearance CLEAR  CLEAR     Specific Gravity, Urine 1.020  1.005 - 1.030     pH 6.0  5.0 - 8.0     Glucose, UA NEGATIVE  NEGATIVE (mg/dL)    Hgb urine dipstick SMALL (*) NEGATIVE     Bilirubin Urine MODERATE (*) NEGATIVE     Ketones, ur 40 (*) NEGATIVE (mg/dL)    Protein, ur 30 (*) NEGATIVE (mg/dL)    Urobilinogen, UA 0.2  0.0 - 1.0 (mg/dL)    Nitrite NEGATIVE  NEGATIVE     Leukocytes, UA NEGATIVE  NEGATIVE    URINE MICROSCOPIC-ADD ON     Status: Abnormal   Collection Time   03/20/11 10:57 PM  Component Value Range Comment   Squamous Epithelial / LPF FEW (*) RARE     WBC, UA 3-6  <3 (WBC/hpf)    RBC / HPF 7-10  <3 (RBC/hpf)    Bacteria, UA FEW (*) RARE     Casts HYALINE CASTS (*) NEGATIVE     Dg Chest Portable 1 View  03/20/2011  *RADIOLOGY REPORT*  Clinical Data: Cough, congestion, shortness of breath and fever; history of smoking.  PORTABLE CHEST - 1 VIEW  Comparison: Chest radiograph performed 02/02/2011  Findings: The lungs are well-aerated.  Mild left basilar and right midlung airspace opacities raise concern for multifocal pneumonia. Underlying vascular congestion is noted; the appearance is atypical for interstitial edema.  No definite pleural effusion or pneumothorax is seen.  The cardiomediastinal silhouette remains normal in size.  No acute osseous abnormalities are identified.  IMPRESSION:  1.  Mild left basilar and right lung airspace  opacities raise concern for multifocal pneumonia. 2.  Underlying vascular congestion noted.  Original Report Authenticated By: Tonia Ghent, M.D.   EKG shows a sinus tachycardia at 146 with a normal axis. Nonspecific changes are noted which could be rate related. No acute ST elevation or depression is seen.  Assessment/Plan  Principal Problem:  *Bilateral pneumonia Active Problems:  COPD with acute exacerbation  HTN (hypertension)  DM type 2 (diabetes mellitus, type 2)  Tobacco abuse  Acute respiratory failure   #1 bilateral pneumonia with acute respiratory failure: This will be treated with Avelox. Legionella antigen will be checked. Oxygen will be provided. We'll also give her nebulizer treatments because of the wheezing. She'll monitored in the step down unit for tonight.  #2 COPD with acute exacerbation: Will be treated with nebulizer treatments. Inhaled steroids will be continued.  #3 history of, diabetes: Sliding scale insulin will be provided. HbA1c will be checked.  #4 tobacco abuse: Counseled was provided. Nicotine patch will be given.  #5 history of hypertension, is stable.  #6 tachycardia: Likely due to the fever, and respiratory failure. She'll be monitored on telemetry.  Hypokalemia will be repleted. Anemia panel will be checked. Fever and leukocytosis likely due to #1.  She's a full code. DVT, prophylaxis will be initiated.  Further management decisions will depend on results of further testing and patient's response to treatment.  Takeira Yanes 03/21/2011, 1:28 AM

## 2011-03-21 NOTE — ED Notes (Signed)
Pt transported to rm 323 on 2 LPM [place] via ED tech

## 2011-03-21 NOTE — Progress Notes (Signed)
Subjective: Patient relates her shortness of breath is worse. She relates that after she went to the bathroom she started getting more and more short of breath and she had some chest pressure or all over her chest and should be breath. Now she is breathing about  20-25 times change her to non-rebreather no satting 90-92%. Objective: Filed Vitals:   03/21/11 0242 03/21/11 0515 03/21/11 0750 03/21/11 0827  BP:  131/64    Pulse:  111    Temp:  98.4 F (36.9 C)    TempSrc:  Axillary    Resp:  20    Height:      Weight:      SpO2: 92% 80% 88% 92%   Weight change:   Intake/Output Summary (Last 24 hours) at 03/21/11 0839 Last data filed at 03/21/11 0300  Gross per 24 hour  Intake    240 ml  Output      0 ml  Net    240 ml    General: Alert, awake, oriented x3, in no acute distress. Using accessory muscles to breathe per HEENT: No bruits, no goiter.  Heart: Regular rate and rhythm, without murmurs, rubs, gallops.  Lungs: Poor air movement with wheezing bilaterally. Abdomen: Soft, nontender, nondistended, positive bowel sounds.  Neuro: Grossly intact, nonfocal.   Lab Results:  [facility] 03/21/11 0531 03/20/11 2135  NA 135 131*  K 3.4* 3.2*  CL 96 91*  CO2 26 28  GLUCOSE 271* 217*  BUN 8 5*  CREATININE 0.60 0.69  CALCIUM 9.4 9.2  MG -- --  PHOS -- --    Basename 03/21/11 0531  AST 11  ALT 11  ALKPHOS 67  BILITOT 0.3  PROT 7.9  ALBUMIN 3.1*   No results found for this basename: LIPASE:2,AMYLASE:2 in the last 72 hours  Basename 03/21/11 0531 03/20/11 2135  WBC 26.4* 22.7*  NEUTROABS -- 19.5*  HGB 9.1* 9.2*  HCT 31.6* 32.5*  MCV 71.0* 71.4*  PLT 343 350    Basename 03/21/11 0532  CKTOTAL 51  CKMB 2.0  CKMBINDEX --  TROPONINI <0.30   No results found for this basename: POCBNP:3 in the last 72 hours No results found for this basename: DDIMER:2 in the last 72 hours No results found for this basename: HGBA1C:2 in the last 72 hours No results found for this  basename: CHOL:2,HDL:2,LDLCALC:2,TRIG:2,CHOLHDL:2,LDLDIRECT:2 in the last 72 hours No results found for this basename: TSH,T4TOTAL,FREET3,T3FREE,THYROIDAB in the last 72 hours  Basename 03/21/11 0531  VITAMINB12 --  FOLATE --  FERRITIN --  TIBC --  IRON --  RETICCTPCT 1.4    Micro Results: Recent Results (from the past 240 hour(s))  CULTURE, BLOOD (ROUTINE X 2)     Status: Normal (Preliminary result)   Collection Time   03/20/11  9:35 PM      Component Value Range Status Comment   Specimen Description BLOOD LEFT HAND   Final    Special Requests BOTTLES DRAWN AEROBIC AND ANAEROBIC 6CC   Final    Culture PENDING   Incomplete    Report Status PENDING   Incomplete   CULTURE, BLOOD (ROUTINE X 2)     Status: Normal (Preliminary result)   Collection Time   03/20/11  9:35 PM      Component Value Range Status Comment   Specimen Description RIGHT ANTECUBITAL   Final    Special Requests BOTTLES DRAWN AEROBIC AND ANAEROBIC [facility] EACH   Final    Culture PENDING   Incomplete  Report Status PENDING   Incomplete     Studies/Results: Dg Chest Portable 1 View  03/20/2011  *RADIOLOGY REPORT*  Clinical Data: Cough, congestion, shortness of breath and fever; history of smoking.  PORTABLE CHEST - 1 VIEW  Comparison: Chest radiograph performed 02/02/2011  Findings: The lungs are well-aerated.  Mild left basilar and right midlung airspace opacities raise concern for multifocal pneumonia. Underlying vascular congestion is noted; the appearance is atypical for interstitial edema.  No definite pleural effusion or pneumothorax is seen.  The cardiomediastinal silhouette remains normal in size.  No acute osseous abnormalities are identified.  IMPRESSION:  1.  Mild left basilar and right lung airspace opacities raise concern for multifocal pneumonia. 2.  Underlying vascular congestion noted.  Original Report Authenticated By: Tonia Ghent, M.D.    Medications: I have reviewed the patient's current  medications.   Principal Problem:  *Respiratory failure with hypoxia Active Problems:  COPD with acute exacerbation  HTN (hypertension)  DM type 2 (diabetes mellitus, type 2)  Tobacco abuse  Bilateral pneumonia  Sinus tachycardia  Hyponatremia    Assessment and plan: -We'll transfer her to the ICU, start her on BiPAP. Also start her on IV Solu-Medrol, will DC her Avelox start her on Rocephin and azithromycin. We'll also increase her albuterol treatments. We'll get a gas tomorrow morning. And monitor her saturations very closely. I have talked to Dr. Juanetta Gosling, and will call him this afternoon if she does not improve. 2 intubate.  -We'll start her on IV Solu-Medrol, continue albuterol and Atrovent. Will continue on BiPAP. Influenza A and B. PCR done and it was negative.  -Will place n.p.o. Continue sliding scale insulin. As he glipizide.  -DC Avelox start her on Rocephin and a azithromycin, influenza was negative.  -Sinus tachycardia probably secondary to hypoxia we'll continue to monitor. EKG shows sinus tachycardia with first-degree AV block. No T wave inversions.  -Will continue IV fluids check a b met in the morning.    LOS: 1 day   Marinda Elk M.D. Pager: [phone] [facility] 03/21/2011, 8:39 AM

## 2011-03-22 ENCOUNTER — Inpatient Hospital Stay (HOSPITAL_COMMUNITY): Payer: Medicaid Other

## 2011-03-22 LAB — URINE CULTURE
Colony Count: NO GROWTH
Culture  Setup Time: 201211271825
Culture: NO GROWTH

## 2011-03-22 LAB — GLUCOSE, CAPILLARY
Glucose-Capillary: 267 mg/dL — ABNORMAL HIGH (ref 70–99)
Glucose-Capillary: 277 mg/dL — ABNORMAL HIGH (ref 70–99)
Glucose-Capillary: 384 mg/dL — ABNORMAL HIGH (ref 70–99)
Glucose-Capillary: 267 mg/dL — ABNORMAL HIGH (ref 70–99)

## 2011-03-22 LAB — BASIC METABOLIC PANEL
BUN: 16 mg/dL (ref 6–23)
CO2: 25 mEq/L (ref 19–32)
Calcium: 9 mg/dL (ref 8.4–10.5)
Chloride: 103 mEq/L (ref 96–112)
Creatinine, Ser: 0.73 mg/dL (ref 0.50–1.10)
GFR calc Af Amer: >90 mL/min (ref >90)
GFR calc non Af Amer: >90 mL/min (ref >90)
Glucose, Bld: 249 mg/dL — ABNORMAL HIGH (ref 70–99)
Potassium: 4.2 mEq/L (ref 3.5–5.1)
Sodium: 137 mEq/L (ref 135–145)

## 2011-03-22 LAB — CARDIAC PANEL(CRET KIN+CKTOT+MB+TROPI)
CK, MB: 2.3 ng/mL (ref 0.3–4.0)
Relative Index: INVALID (ref 0.0–2.5)
Total CK: 45 U/L (ref 7–177)
Troponin I: <0.3 ng/mL (ref <0.30)

## 2011-03-22 LAB — LEGIONELLA ANTIGEN, URINE: Legionella Antigen, Urine: NEGATIVE

## 2011-03-22 MED ORDER — GI COCKTAIL ~~LOC~~
ORAL | Status: AC
  Filled 2011-03-22: qty 30

## 2011-03-22 MED ORDER — FLUTICASONE-SALMETEROL 500-50 MCG/DOSE IN AEPB
1.0000 | INHALATION_SPRAY | Freq: Two times a day (BID) | RESPIRATORY_TRACT | Status: DC
  Administered 2011-03-22 – 2011-03-27 (×10): 1 via RESPIRATORY_TRACT
  Filled 2011-03-22: qty 14

## 2011-03-22 MED ORDER — SODIUM CHLORIDE 0.9 % IJ SOLN
INTRAMUSCULAR | Status: AC
  Filled 2011-03-22: qty 3

## 2011-03-22 MED ORDER — CITALOPRAM HYDROBROMIDE 20 MG PO TABS
10.0000 mg | ORAL_TABLET | Freq: Every day | ORAL | Status: DC
  Administered 2011-03-22 – 2011-03-27 (×6): 10 mg via ORAL
  Filled 2011-03-22 (×6): qty 1

## 2011-03-22 MED ORDER — PANTOPRAZOLE SODIUM 40 MG PO TBEC
40.0000 mg | DELAYED_RELEASE_TABLET | Freq: Two times a day (BID) | ORAL | Status: DC
  Administered 2011-03-23 – 2011-03-27 (×9): 40 mg via ORAL
  Filled 2011-03-22 (×9): qty 1

## 2011-03-22 MED ORDER — GI COCKTAIL ~~LOC~~
30.0000 mL | Freq: Once | ORAL | Status: AC
  Administered 2011-03-22: 30 mL via ORAL
  Filled 2011-03-22: qty 30

## 2011-03-22 NOTE — Progress Notes (Addendum)
Subjective: This lady was admitted with exacerbation of COPD to the point where yesterday she desaturated and is now in the intensive care unit. She is on a nonrebreather but her breathing appears to be comfortable. She unfortunately continues to smoke one pack of cigarettes per day. She also complains of low back pain which has been present for the last one year. She also is rather depressed, tearful and has expresses that everyone would be better off if she were dead although she never described any plan and I don't think she is realistic about suicide. She tells me that Celexa did help her in the past.          Physical Exam: Blood pressure 90/50, pulse 102, temperature 97.7 F (36.5 C), temperature source Axillary, resp. rate 23, height 5\' 4"  (1.626 m), weight 125.6 kg (276 lb 14.4 oz), last menstrual period 02/27/2011, SpO2 84.00%. She does not appear to have increased work of breathing at rest. Lung fields show bilateral wheezing throughout and it is not particularly tight. There is no  peripheral or central cyanosis. She is alert and orientated. Heart sounds are present and normal without murmurs. She is obese.   Investigations:  Recent Results (from the past 240 hour(s))  CULTURE, BLOOD (ROUTINE X 2)     Status: Normal (Preliminary result)   Collection Time   03/20/11  9:35 PM      Component Value Range Status Comment   Specimen Description BLOOD LEFT HAND   Final    Special Requests BOTTLES DRAWN AEROBIC AND ANAEROBIC 6CC   Final    Culture NO GROWTH 1 DAY   Final    Report Status PENDING   Incomplete   CULTURE, BLOOD (ROUTINE X 2)     Status: Normal (Preliminary result)   Collection Time   03/20/11  9:35 PM      Component Value Range Status Comment   Specimen Description RIGHT ANTECUBITAL   Final    Special Requests BOTTLES DRAWN AEROBIC AND ANAEROBIC [facility] EACH   Final    Culture NO GROWTH 1 DAY   Final    Report Status PENDING   Incomplete   MRSA PCR SCREENING      Status: Normal   Collection Time   03/21/11  8:56 AM      Component Value Range Status Comment   MRSA by PCR NEGATIVE  NEGATIVE  Final      Basic Metabolic Panel:  Basename 03/22/11 0452 03/21/11 0531  NA 137 135  K 4.2 3.4*  CL 103 96  CO2 25 26  GLUCOSE 249* 271*  BUN 16 8  CREATININE 0.73 0.60  CALCIUM 9.0 9.4  MG -- --  PHOS -- --   Liver Function Tests:  [facility] 03/21/11 0531  AST 11  ALT 11  ALKPHOS 67  BILITOT 0.3  PROT 7.9  ALBUMIN 3.1*     CBC:  Basename 03/21/11 0531 03/20/11 2135  WBC 26.4* 22.7*  NEUTROABS -- 19.5*  HGB 9.1* 9.2*  HCT 31.6* 32.5*  MCV 71.0* 71.4*  PLT 343 350    Dg Chest Portable 1 View  03/20/2011  *RADIOLOGY REPORT*  Clinical Data: Cough, congestion, shortness of breath and fever; history of smoking.  PORTABLE CHEST - 1 VIEW  Comparison: Chest radiograph performed 02/02/2011  Findings: The lungs are well-aerated.  Mild left basilar and right midlung airspace opacities raise concern for multifocal pneumonia. Underlying vascular congestion is noted; the appearance is atypical for interstitial edema.  No definite  pleural effusion or pneumothorax is seen.  The cardiomediastinal silhouette remains normal in size.  No acute osseous abnormalities are identified.  IMPRESSION:  1.  Mild left basilar and right lung airspace opacities raise concern for multifocal pneumonia. 2.  Underlying vascular congestion noted.  Original Report Authenticated By: Tonia Ghent, M.D.      Medications: I have reviewed the patient's current medications.  Impression: 1. Exacerbation of COPD with respiratory failure. Possible pneumonia bilaterally. 2. Hypertension. 3. Type 2 diabetes mellitus. 4. Ongoing tobacco abuse. 5. Obesity. 6.Depression.     Plan: 1. Continue current intravenous steroids and antibiotics. 2. Repeat chest x-ray. Try to reduce FiO2 to see if she will tolerate nasal cannula oxygen. Start a carbohydrate modified diet. 3. Start  Celexa 10 mg daily. 4. Lumbar spine x-ray to investigate low back pain. I've counseled her about tobacco cessation once again.     LOS: 2 days   Romelia Bromell C 03/22/2011, 10:21 AM

## 2011-03-22 NOTE — Progress Notes (Signed)
Inpatient Diabetes Program Recommendations  AACE/ADA: New Consensus Statement on Inpatient Glycemic Control (2009)  Target Ranges:  Prepandial:   less than 140 mg/dL      Peak postprandial:   less than 180 mg/dL (1-2 hours)      Critically ill patients:  140 - 180 mg/dL   Reason for Visit: Elevated glucose:  249 mg/dL  Inpatient Diabetes Program Recommendations Insulin - Basal: Add Lantus 15 units daily while on IV steroids

## 2011-03-23 ENCOUNTER — Inpatient Hospital Stay (HOSPITAL_COMMUNITY): Payer: Medicaid Other

## 2011-03-23 LAB — COMPREHENSIVE METABOLIC PANEL
ALT: 10 U/L (ref 0–35)
AST: 9 U/L (ref 0–37)
Albumin: 2.7 g/dL — ABNORMAL LOW (ref 3.5–5.2)
Alkaline Phosphatase: 64 U/L (ref 39–117)
BUN: 17 mg/dL (ref 6–23)
CO2: 27 mEq/L (ref 19–32)
Calcium: 8.8 mg/dL (ref 8.4–10.5)
Chloride: 101 mEq/L (ref 96–112)
Creatinine, Ser: 0.69 mg/dL (ref 0.50–1.10)
GFR calc Af Amer: >90 mL/min (ref >90)
GFR calc non Af Amer: >90 mL/min (ref >90)
Glucose, Bld: 270 mg/dL — ABNORMAL HIGH (ref 70–99)
Potassium: 3.9 mEq/L (ref 3.5–5.1)
Sodium: 135 mEq/L (ref 135–145)
Total Bilirubin: 0.1 mg/dL — ABNORMAL LOW (ref 0.3–1.2)
Total Protein: 6.9 g/dL (ref 6.0–8.3)

## 2011-03-23 LAB — CBC
HCT: 29.2 % — ABNORMAL LOW (ref 36.0–46.0)
Hemoglobin: 8.4 g/dL — ABNORMAL LOW (ref 12.0–15.0)
MCH: 20.8 pg — ABNORMAL LOW (ref 26.0–34.0)
MCHC: 28.8 g/dL — ABNORMAL LOW (ref 30.0–36.0)
MCV: 72.3 fL — ABNORMAL LOW (ref 78.0–100.0)
Platelets: 371 10*3/uL (ref 150–400)
RBC: 4.04 MIL/uL (ref 3.87–5.11)
RDW: 18.2 % — ABNORMAL HIGH (ref 11.5–15.5)
WBC: 20.9 10*3/uL — ABNORMAL HIGH (ref 4.0–10.5)

## 2011-03-23 LAB — GLUCOSE, CAPILLARY
Glucose-Capillary: 283 mg/dL — ABNORMAL HIGH (ref 70–99)
Glucose-Capillary: 302 mg/dL — ABNORMAL HIGH (ref 70–99)
Glucose-Capillary: 285 mg/dL — ABNORMAL HIGH (ref 70–99)
Glucose-Capillary: 406 mg/dL — ABNORMAL HIGH (ref 70–99)

## 2011-03-23 LAB — BLOOD GAS, ARTERIAL
Acid-base deficit: 0.6 mmol/L (ref 0.0–2.0)
Bicarbonate: 23.8 mEq/L (ref 20.0–24.0)
O2 Content: 6 L/min
O2 Saturation: 86.5 %
Patient temperature: 37
TCO2: 22.4 mmol/L (ref 0–100)
pCO2 arterial: 40.9 mmHg (ref 35.0–45.0)
pH, Arterial: 7.383 (ref 7.350–7.400)
pO2, Arterial: 53.3 mmHg — ABNORMAL LOW (ref 80.0–100.0)

## 2011-03-23 LAB — GLUCOSE, RANDOM: Glucose, Bld: 414 mg/dL — ABNORMAL HIGH (ref 70–99)

## 2011-03-23 MED ORDER — VANCOMYCIN HCL IN DEXTROSE 1-5 GM/200ML-% IV SOLN
1000.0000 mg | INTRAVENOUS | Status: AC
  Administered 2011-03-23 (×2): 1000 mg via INTRAVENOUS
  Filled 2011-03-23 (×2): qty 200

## 2011-03-23 MED ORDER — INSULIN GLARGINE 100 UNIT/ML ~~LOC~~ SOLN
20.0000 [IU] | Freq: Every day | SUBCUTANEOUS | Status: DC
  Administered 2011-03-23: 20 [IU] via SUBCUTANEOUS
  Filled 2011-03-23: qty 3

## 2011-03-23 MED ORDER — GUAIFENESIN ER 600 MG PO TB12
1200.0000 mg | ORAL_TABLET | Freq: Two times a day (BID) | ORAL | Status: DC
  Administered 2011-03-23 – 2011-03-27 (×9): 1200 mg via ORAL
  Filled 2011-03-23 (×9): qty 2

## 2011-03-23 MED ORDER — VANCOMYCIN HCL IN DEXTROSE 1-5 GM/200ML-% IV SOLN
1000.0000 mg | Freq: Three times a day (TID) | INTRAVENOUS | Status: DC
  Administered 2011-03-24 – 2011-03-25 (×5): 1000 mg via INTRAVENOUS
  Filled 2011-03-23 (×11): qty 200

## 2011-03-23 MED ORDER — VANCOMYCIN HCL IN DEXTROSE 1-5 GM/200ML-% IV SOLN
INTRAVENOUS | Status: AC
  Filled 2011-03-23: qty 400

## 2011-03-23 MED ORDER — VANCOMYCIN HCL IN DEXTROSE 1-5 GM/200ML-% IV SOLN
INTRAVENOUS | Status: AC
  Filled 2011-03-23: qty 200

## 2011-03-23 MED ORDER — INSULIN ASPART 100 UNIT/ML ~~LOC~~ SOLN
10.0000 [IU] | Freq: Once | SUBCUTANEOUS | Status: AC
  Administered 2011-03-23: 10 [IU] via SUBCUTANEOUS

## 2011-03-23 NOTE — Progress Notes (Signed)
Subjective: This lady looks worse today. She is required BiPAP overnight. She is wheezing and looks tired with her respiration.          Physical Exam: Blood pressure 104/50, pulse 88, temperature 97.9 F (36.6 C), temperature source Axillary, resp. rate 24, height 5\' 4"  (1.626 m), weight 133.3 kg (293 lb 14 oz), last menstrual period 02/27/2011, SpO2 96.00%. She does  appear to have increased work of breathing at rest. Lung fields show bilateral wheezing throughout and it is somewhat tight. There is no  peripheral or central cyanosis. She is alert and orientated. Heart sounds are present and normal without murmurs. She is obese.   Investigations:  Recent Results (from the past 240 hour(s))  CULTURE, BLOOD (ROUTINE X 2)     Status: Normal (Preliminary result)   Collection Time   03/20/11  9:35 PM      Component Value Range Status Comment   Specimen Description BLOOD LEFT HAND   Final    Special Requests BOTTLES DRAWN AEROBIC AND ANAEROBIC 6CC   Final    Culture NO GROWTH 2 DAYS   Final    Report Status PENDING   Incomplete   CULTURE, BLOOD (ROUTINE X 2)     Status: Normal (Preliminary result)   Collection Time   03/20/11  9:35 PM      Component Value Range Status Comment   Specimen Description RIGHT ANTECUBITAL   Final    Special Requests BOTTLES DRAWN AEROBIC AND ANAEROBIC [facility] EACH   Final    Culture NO GROWTH 2 DAYS   Final    Report Status PENDING   Incomplete   URINE CULTURE     Status: Normal   Collection Time   03/20/11 10:57 PM      Component Value Range Status Comment   Specimen Description URINE, CATHETERIZED   Final    Special Requests NONE   Final    Setup Time [id number]   Final    Colony Count NO GROWTH   Final    Culture NO GROWTH   Final    Report Status 03/22/2011 FINAL   Final   MRSA PCR SCREENING     Status: Normal   Collection Time   03/21/11  8:56 AM      Component Value Range Status Comment   MRSA by PCR NEGATIVE  NEGATIVE  Final      Basic  Metabolic Panel:  Basename 03/23/11 0416 03/22/11 0452  NA 135 137  K 3.9 4.2  CL 101 103  CO2 27 25  GLUCOSE 270* 249*  BUN 17 16  CREATININE 0.69 0.73  CALCIUM 8.8 9.0  MG -- --  PHOS -- --   Liver Function Tests:  [facility] 03/23/11 0416 03/21/11 0531  AST 9 11  ALT 10 11  ALKPHOS 64 67  BILITOT 0.1* 0.3  PROT 6.9 7.9  ALBUMIN 2.7* 3.1*     CBC:  Basename 03/23/11 0416 03/21/11 0531 03/20/11 2135  WBC 20.9* 26.4* --  NEUTROABS -- -- 19.5*  HGB 8.4* 9.1* --  HCT 29.2* 31.6* --  MCV 72.3* 71.0* --  PLT 371 343 --    Dg Chest 2 View  03/22/2011  *RADIOLOGY REPORT*  Clinical Data: Low back pain.  Follow-up pneumonia.  CHEST - 2 VIEW  Comparison: 03/20/2011  Findings: Chronic peribronchial thickening.  Focal opacity in the right middle lobe could represent pneumonia.  Left lung is clear. Heart is normal size.  No effusions.  IMPRESSION: Chronic bronchitic  changes.  Probable right middle lobe pneumonia.  Original Report Authenticated By: Cyndie Chime, M.D.   Dg Lumbar Spine 2-3 Views  03/22/2011  *RADIOLOGY REPORT*  Clinical Data: Low back pain,  LUMBAR SPINE - 2-3 VIEW  Comparison: CT 09/01/2010  Findings: There is normal alignment of the lumbar vertebral bodies. There is mild endplate spurring at L1-L2.  No subluxation.  No loss of vertebral body height or disc height.  IMPRESSION: Mild disc osteophytic disease.  Original Report Authenticated By: Genevive Bi, M.D.      Medications: I have reviewed the patient's current medications.  Impression: 1. Exacerbation of COPD with respiratory failure, exacerbated by right middle lobe pneumonia. 2. Hypertension. 3. Type 2 diabetes mellitus. 4. Ongoing tobacco abuse. 5. Obesity. 6.Depression.     Plan: 1. Continue current intravenous steroids and antibiotics. 2. Pulmonology consultation with Dr. Juanetta Gosling as she is worse today. 3. Arterial blood gas stat.     LOS: 3 days   Sharlon Pfohl C 03/23/2011,  8:27 AM

## 2011-03-23 NOTE — Progress Notes (Addendum)
ANTIBIOTIC CONSULT NOTE - INITIAL  Pharmacy Consult for  Vancomycin Indication: PNA  Allergies  Allergen Reactions  . Codeine Nausea Only    Patient Measurements: Height: 5\' 4"  (162.6 cm) Weight: 293 lb 14 oz (133.3 kg) IBW/kg (Calculated) : 54.7  Adjusted Body Weight:100kg Vital Signs:  Intake/Output from previous day: 11/28 0701 - 11/29 0700 In: 3132.5 [P.O.:720; I.V.:2412.5] Out: 300 [Urine:300] Intake/Output from this shift: Total I/O In: 2527.1 [P.O.:1440; I.V.:487.1; IV Piggyback:600] Out: -   Labs:  Basename 03/23/11 0416 03/22/11 0452 03/21/11 0531 03/20/11 2135  WBC 20.9* -- 26.4* 22.7*  HGB 8.4* -- 9.1* 9.2*  PLT 371 -- 343 350  LABCREA -- -- -- --  CREATININE 0.69 0.73 0.60 --   Estimated Creatinine Clearance: 120.7 ml/min (by C-G formula based on Cr of 0.69).    Microbiology: Recent Results (from the past 720 hour(s))  CULTURE, BLOOD (ROUTINE X 2)     Status: Normal (Preliminary result)   Collection Time   03/20/11  9:35 PM      Component Value Range Status Comment   Specimen Description BLOOD LEFT HAND   Final    Special Requests BOTTLES DRAWN AEROBIC AND ANAEROBIC 6CC   Final    Culture NO GROWTH 3 DAYS   Final    Report Status PENDING   Incomplete   CULTURE, BLOOD (ROUTINE X 2)     Status: Normal (Preliminary result)   Collection Time   03/20/11  9:35 PM      Component Value Range Status Comment   Specimen Description RIGHT ANTECUBITAL   Final    Special Requests BOTTLES DRAWN AEROBIC AND ANAEROBIC 6CC EACH   Final    Culture NO GROWTH 3 DAYS   Final    Report Status PENDING   Incomplete   URINE CULTURE     Status: Normal   Collection Time   03/20/11 10:57 PM      Component Value Range Status Comment   Specimen Description URINE, CATHETERIZED   Final    Special Requests NONE   Final    Setup Time [id number]   Final    Colony Count NO GROWTH   Final    Culture NO GROWTH   Final    Report Status 03/22/2011 FINAL   Final   MRSA PCR  SCREENING     Status: Normal   Collection Time   03/21/11  8:56 AM      Component Value Range Status Comment   MRSA by PCR NEGATIVE  NEGATIVE  Final     Medical History: Past Medical History  Diagnosis Date  . Diabetes mellitus   . COPD (chronic obstructive pulmonary disease)   . HTN (hypertension)   . Low back pain   . Tachycardia     never had test done since no insurance  . Depression   . Asthma   . Shortness of breath   . Anxiety   . Gastric erosions     EGD 08/2010.  . Internal hemorrhoids     Colonoscopy 5/12.  [name] Heavy menses      Assessment:  Empiric therapy  Goal of Therapy:  Vancomycin trough level 15-20 mcg/ml  Plan Vancomycin 2000 mg IV  X 1 then Vancomycin 1000 mg IV every 8hrs (d/w Paco)   [name], Sheila J 03/23/2011,6:51 PM

## 2011-03-23 NOTE — Consult Note (Signed)
Consult requested by: Dr. Karilyn Cota Consult requested for an respiratory distress:  HPI: This is a 45 year old who has a history of COPD. She says that she has been sick for several days and eventually came to the emergency room. She was admitted to the hospital with increasing shortness of breath cough and congestion and became worse to the point that she had to be transferred down to the step down unit and placed on BiPAP. She has not improved a great deal despite the BiPAP. She says at home she is barely able to manage her activities of daily living. She has a great deal of back pain at home that seems to cause her trouble. She says that she coughs and coughs up sputum but the sputum is very thick and very difficult to move  Past Medical History  Diagnosis Date  . Diabetes mellitus   . COPD (chronic obstructive pulmonary disease)   . HTN (hypertension)   . Low back pain   . Tachycardia     never had test done since no insurance  . Depression   . Asthma   . Shortness of breath   . Anxiety   . Gastric erosions     EGD 08/2010.  . Internal hemorrhoids     Colonoscopy 5/12.  [name] Heavy menses      Family History  Problem Relation Age of Onset  . Heart attack Father 3    deceased, etoh use  . Heart attack Mother 52    deceased  . Diabetes Mother   . Breast cancer Mother   . Heart failure Mother     oxygen dependence, nonsmoker  . Colon cancer Neg Hx   . Liver disease Maternal Aunt 40    died while on liver transplant list  . Heart attack Maternal Grandmother     premature CAD  . Ulcers Sister      History   Social History  . Marital Status: Single    Spouse Name: N/A    Number of Children: 2  . Years of Education: N/A   Occupational History  . unemployed    Social History Main Topics  . Smoking status: Current Everyday Smoker -- 1.0 packs/day for 30 years    Types: Cigarettes  . Smokeless tobacco: Current User  . Alcohol Use: No  . Drug Use: No  . Sexually  Active: No   Other Topics Concern  . None   Social History Narrative  . None     ROS: Except as mentioned is negative    Objective: Vital signs in last 24 hours: Temp:  [97.8 F (36.6 C)-98.6 F (37 C)] 97.9 F (36.6 C) (11/29 0414) Pulse Rate:  [80-131] 88  (11/29 0700) Resp:  [13-28] 24  (11/29 0700) BP: (92-129)/(41-65) 104/50 mmHg (11/29 0700) SpO2:  [87 %-100 %] 96 % (11/29 0720) FiO2 (%):  [55 %] 55 % (11/29 0341) Weight:  [133.3 kg (293 lb 14 oz)] 293 lb 14 oz (133.3 kg) (11/29 0414) Weight change: 7.7 kg (16 lb 15.6 oz) Last BM Date: 03/20/11  Intake/Output from previous day: 11/28 0701 - 11/29 0700 In: 3057.5 [P.O.:720; I.V.:2337.5] Out: 300 [Urine:300]  PHYSICAL EXAM She is morbidly obese. She is wheezing audibly without the use of a stethoscope. Her heart rate is been as high as 101 and now is about 110. Her blood pressures in the 120/70 range. Her pupils are reactive to light and accommodation. Her nose and throat are clear. Her neck is supple  without masses. Her chest shows marked bilateral rhonchi and wheezes. She is tachypnea at rest and looks like she's having some respiratory distress heart sounds are distant but her heart is regular. Her abdomen is soft obese without masses. She does not have any peripheral edema. Her central nervous system examination other than her emotional lability is intact  Lab Results: Basic Metabolic Panel:  Basename 03/23/11 0416 03/22/11 0452  NA 135 137  K 3.9 4.2  CL 101 103  CO2 27 25  GLUCOSE 270* 249*  BUN 17 16  CREATININE 0.69 0.73  CALCIUM 8.8 9.0  MG -- --  PHOS -- --   Liver Function Tests:  Basename 03/23/11 0416 03/21/11 0531  AST 9 11  ALT 10 11  ALKPHOS 64 67  BILITOT 0.1* 0.3  PROT 6.9 7.9  ALBUMIN 2.7* 3.1*   No results found for this basename: LIPASE:2,AMYLASE:2 in the last 72 hours No results found for this basename: AMMONIA:2 in the last 72 hours CBC:  Basename 03/23/11 0416 03/21/11 0531  03/20/11 2135  WBC 20.9* 26.4* --  NEUTROABS -- -- 19.5*  HGB 8.4* 9.1* --  HCT 29.2* 31.6* --  MCV 72.3* 71.0* --  PLT 371 343 --   Cardiac Enzymes:  Basename 03/22/11 0001 03/21/11 1537 03/21/11 0532  CKTOTAL 45 49 51  CKMB 2.3 2.3 2.0  CKMBINDEX -- -- --  TROPONINI <0.30 <0.30 <0.30   BNP: No results found for this basename: POCBNP:3 in the last 72 hours D-Dimer: No results found for this basename: DDIMER:2 in the last 72 hours CBG:  Basename 03/22/11 2120 03/22/11 1654 03/22/11 1151 03/22/11 0810 03/21/11 2152 03/21/11 1737  GLUCAP 267* 384* 277* 267* 259* 227*   Hemoglobin A1C:  Basename 03/21/11 0531  HGBA1C 7.1*   Fasting Lipid Panel: No results found for this basename: CHOL,HDL,LDLCALC,TRIG,CHOLHDL,LDLDIRECT in the last 72 hours Thyroid Function Tests:  Basename 03/21/11 0531  TSH 1.600  T4TOTAL --  FREET4 --  T3FREE --  THYROIDAB --   Anemia Panel:  Basename 03/21/11 0531  VITAMINB12 294  FOLATE 5.1  FERRITIN 102  TIBC 323  IRON 11*  RETICCTPCT 1.4   Coagulation: No results found for this basename: LABPROT:2,INR:2 in the last 72 hours Urine Drug Screen: Drugs of Abuse     Component Value Date/Time   LABOPIA POSITIVE* 10/28/2007 2113   COCAINSCRNUR NONE DETECTED 10/28/2007 2113   LABBENZ NONE DETECTED 10/28/2007 2113   AMPHETMU NONE DETECTED 10/28/2007 2113   THCU POSITIVE* 10/28/2007 2113   LABBARB  Value: NONE DETECTED        DRUG SCREEN FOR MEDICAL PURPOSES ONLY.  IF CONFIRMATION IS NEEDED FOR ANY PURPOSE, NOTIFY LAB WITHIN 5 DAYS. 10/28/2007 2113    Alcohol Level: No results found for this basename: ETH:2 in the last 72 hours Urinalysis:  Misc. Labs:   ABGS:  Basename 03/21/11 1100  PHART 7.378  PO2ART 194.0*  TCO2 22.2  HCO3 23.8     MICROBIOLOGY: Recent Results (from the past 240 hour(s))  CULTURE, BLOOD (ROUTINE X 2)     Status: Normal (Preliminary result)   Collection Time   03/20/11  9:35 PM      Component Value Range Status  Comment   Specimen Description BLOOD LEFT HAND   Final    Special Requests BOTTLES DRAWN AEROBIC AND ANAEROBIC 6CC   Final    Culture NO GROWTH 2 DAYS   Final    Report Status PENDING   Incomplete   CULTURE,  BLOOD (ROUTINE X 2)     Status: Normal (Preliminary result)   Collection Time   03/20/11  9:35 PM      Component Value Range Status Comment   Specimen Description RIGHT ANTECUBITAL   Final    Special Requests BOTTLES DRAWN AEROBIC AND ANAEROBIC 6CC EACH   Final    Culture NO GROWTH 2 DAYS   Final    Report Status PENDING   Incomplete   URINE CULTURE     Status: Normal   Collection Time   03/20/11 10:57 PM      Component Value Range Status Comment   Specimen Description URINE, CATHETERIZED   Final    Special Requests NONE   Final    Setup Time [id number]   Final    Colony Count NO GROWTH   Final    Culture NO GROWTH   Final    Report Status 03/22/2011 FINAL   Final   MRSA PCR SCREENING     Status: Normal   Collection Time   03/21/11  8:56 AM      Component Value Range Status Comment   MRSA by PCR NEGATIVE  NEGATIVE  Final     Studies/Results: Dg Chest 2 View  03/22/2011  *RADIOLOGY REPORT*  Clinical Data: Low back pain.  Follow-up pneumonia.  CHEST - 2 VIEW  Comparison: 03/20/2011  Findings: Chronic peribronchial thickening.  Focal opacity in the right middle lobe could represent pneumonia.  Left lung is clear. Heart is normal size.  No effusions.  IMPRESSION: Chronic bronchitic changes.  Probable right middle lobe pneumonia.  Original Report Authenticated By: Cyndie Chime, M.D.   Dg Lumbar Spine 2-3 Views  03/22/2011  *RADIOLOGY REPORT*  Clinical Data: Low back pain,  LUMBAR SPINE - 2-3 VIEW  Comparison: CT 09/01/2010  Findings: There is normal alignment of the lumbar vertebral bodies. There is mild endplate spurring at L1-L2.  No subluxation.  No loss of vertebral body height or disc height.  IMPRESSION: Mild disc osteophytic disease.  Original Report Authenticated By:  Genevive Bi, M.D.    Medications:  Scheduled:   . antiseptic oral rinse  15 mL Mouth Rinse q12n4p  . azithromycin  500 mg Intravenous Q24H  . cefTRIAXone (ROCEPHIN)  IV  1 g Intravenous Q24H  . chlorhexidine  15 mL Mouth Rinse BID  . citalopram  10 mg Oral Daily  . enoxaparin  40 mg Subcutaneous Daily  . fluticasone  2 spray Each Nare Daily  . Fluticasone-Salmeterol  1 puff Inhalation BID  . gi cocktail  30 mL Oral Once  . insulin aspart  0-15 Units Subcutaneous TID WC  . insulin aspart  0-5 Units Subcutaneous QHS  . insulin glargine  20 Units Subcutaneous QHS  . levalbuterol  0.63 mg Nebulization Q6H  . lisinopril  5 mg Oral Daily  . methylPREDNISolone (SOLU-MEDROL) injection  125 mg Intravenous Q6H  . multivitamins ther. w/minerals  1 tablet Oral Daily  . nicotine  14 mg Transdermal Daily  . pantoprazole  40 mg Oral BID AC  . sodium chloride      . DISCONTD: Fluticasone-Salmeterol  1 puff Inhalation BID  . DISCONTD: moxifloxacin  400 mg Intravenous Q24H   Continuous:   . sodium chloride 75 mL/hr at 03/23/11 0600   NFA:OZHYQMVHQIONG, acetaminophen, levalbuterol, LORazepam, morphine, ondansetron (ZOFRAN) IV, ondansetron, oxyCODONE  Assesment: She has COPD and some respiratory failure. She does not appear to be improving a great deal. She has multiple other medical problems  as noted. Principal Problem:  *Respiratory failure with hypoxia Active Problems:  COPD with acute exacerbation  HTN (hypertension)  DM type 2 (diabetes mellitus, type 2)  Tobacco abuse  Bilateral pneumonia  Sinus tachycardia  Hyponatremia    Plan: She already has had a blood gas ordered I'm going to have her get another chest x-ray and see what that shows since she has so much wheezing.    LOS: 3 days   Diana Armijo L 03/23/2011, 7:58 AM

## 2011-03-24 LAB — CBC
HCT: 30.1 % — ABNORMAL LOW (ref 36.0–46.0)
Hemoglobin: 8.6 g/dL — ABNORMAL LOW (ref 12.0–15.0)
MCH: 20.7 pg — ABNORMAL LOW (ref 26.0–34.0)
MCHC: 28.6 g/dL — ABNORMAL LOW (ref 30.0–36.0)
MCV: 72.5 fL — ABNORMAL LOW (ref 78.0–100.0)
Platelets: 369 10*3/uL (ref 150–400)
RBC: 4.15 MIL/uL (ref 3.87–5.11)
RDW: 17.9 % — ABNORMAL HIGH (ref 11.5–15.5)
WBC: 20.6 10*3/uL — ABNORMAL HIGH (ref 4.0–10.5)

## 2011-03-24 LAB — GLUCOSE, CAPILLARY
Glucose-Capillary: 255 mg/dL — ABNORMAL HIGH (ref 70–99)
Glucose-Capillary: 394 mg/dL — ABNORMAL HIGH (ref 70–99)
Glucose-Capillary: 416 mg/dL — ABNORMAL HIGH (ref 70–99)
Glucose-Capillary: 410 mg/dL — ABNORMAL HIGH (ref 70–99)

## 2011-03-24 LAB — GLUCOSE, RANDOM
Glucose, Bld: 385 mg/dL — ABNORMAL HIGH (ref 70–99)
Glucose, Bld: 353 mg/dL — ABNORMAL HIGH (ref 70–99)

## 2011-03-24 LAB — BASIC METABOLIC PANEL
BUN: 12 mg/dL (ref 6–23)
CO2: 27 mEq/L (ref 19–32)
Calcium: 9 mg/dL (ref 8.4–10.5)
Chloride: 100 mEq/L (ref 96–112)
Creatinine, Ser: 0.6 mg/dL (ref 0.50–1.10)
GFR calc Af Amer: >90 mL/min (ref >90)
GFR calc non Af Amer: >90 mL/min (ref >90)
Glucose, Bld: 239 mg/dL — ABNORMAL HIGH (ref 70–99)
Potassium: 3.6 mEq/L (ref 3.5–5.1)
Sodium: 137 mEq/L (ref 135–145)

## 2011-03-24 MED ORDER — SODIUM CHLORIDE 0.9 % IJ SOLN
INTRAMUSCULAR | Status: AC
  Filled 2011-03-24: qty 3

## 2011-03-24 MED ORDER — INSULIN GLARGINE 100 UNIT/ML ~~LOC~~ SOLN
40.0000 [IU] | Freq: Every day | SUBCUTANEOUS | Status: DC
  Administered 2011-03-24 – 2011-03-26 (×3): 40 [IU] via SUBCUTANEOUS

## 2011-03-24 NOTE — Progress Notes (Signed)
Subjective: This lady feels better today. She is still requiring BiPAP last night however.          Physical Exam: Blood pressure 135/56, pulse 91, temperature 97.9 F (36.6 C), temperature source Oral, resp. rate 23, height 5\' 4"  (1.626 m), weight 135 kg (297 lb 9.9 oz), last menstrual period 02/27/2011, SpO2 89.00%. She is walking around on 5 L oxygen per minute. Lung fields show bilateral wheezing as before but not tight. Heart sounds are in sinus rhythm. She is alert and orientated.   Investigations:  Recent Results (from the past 240 hour(s))  CULTURE, BLOOD (ROUTINE X 2)     Status: Normal (Preliminary result)   Collection Time   03/20/11  9:35 PM      Component Value Range Status Comment   Specimen Description BLOOD LEFT HAND   Final    Special Requests BOTTLES DRAWN AEROBIC AND ANAEROBIC 6CC   Final    Culture NO GROWTH 3 DAYS   Final    Report Status PENDING   Incomplete   CULTURE, BLOOD (ROUTINE X 2)     Status: Normal (Preliminary result)   Collection Time   03/20/11  9:35 PM      Component Value Range Status Comment   Specimen Description RIGHT ANTECUBITAL   Final    Special Requests BOTTLES DRAWN AEROBIC AND ANAEROBIC [facility] EACH   Final    Culture NO GROWTH 3 DAYS   Final    Report Status PENDING   Incomplete   URINE CULTURE     Status: Normal   Collection Time   03/20/11 10:57 PM      Component Value Range Status Comment   Specimen Description URINE, CATHETERIZED   Final    Special Requests NONE   Final    Setup Time [id number]   Final    Colony Count NO GROWTH   Final    Culture NO GROWTH   Final    Report Status 03/22/2011 FINAL   Final   MRSA PCR SCREENING     Status: Normal   Collection Time   03/21/11  8:56 AM      Component Value Range Status Comment   MRSA by PCR NEGATIVE  NEGATIVE  Final      Basic Metabolic Panel:  Basename 03/24/11 0443 03/23/11 2152 03/23/11 0416  NA 137 -- 135  K 3.6 -- 3.9  CL 100 -- 101  CO2 27 -- 27  GLUCOSE  239* 414* --  BUN 12 -- 17  CREATININE 0.60 -- 0.69  CALCIUM 9.0 -- 8.8  MG -- -- --  PHOS -- -- --   Liver Function Tests:  [facility] 03/23/11 0416  AST 9  ALT 10  ALKPHOS 64  BILITOT 0.1*  PROT 6.9  ALBUMIN 2.7*     CBC:  Basename 03/24/11 0443 03/23/11 0416  WBC 20.6* 20.9*  NEUTROABS -- --  HGB 8.6* 8.4*  HCT 30.1* 29.2*  MCV 72.5* 72.3*  PLT 369 371    Dg Chest 2 View  03/22/2011  *RADIOLOGY REPORT*  Clinical Data: Low back pain.  Follow-up pneumonia.  CHEST - 2 VIEW  Comparison: 03/20/2011  Findings: Chronic peribronchial thickening.  Focal opacity in the right middle lobe could represent pneumonia.  Left lung is clear. Heart is normal size.  No effusions.  IMPRESSION: Chronic bronchitic changes.  Probable right middle lobe pneumonia.  Original Report Authenticated By: Cyndie Chime, M.D.   Dg Lumbar Spine 2-3 Views  03/22/2011  *  RADIOLOGY REPORT*  Clinical Data: Low back pain,  LUMBAR SPINE - 2-3 VIEW  Comparison: CT 09/01/2010  Findings: There is normal alignment of the lumbar vertebral bodies. There is mild endplate spurring at L1-L2.  No subluxation.  No loss of vertebral body height or disc height.  IMPRESSION: Mild disc osteophytic disease.  Original Report Authenticated By: Genevive Bi, M.D.   Dg Chest Port 1 View  03/23/2011  *RADIOLOGY REPORT*  Clinical Data: Pneumonia  PORTABLE CHEST - 1 VIEW  Comparison: 03/22/2011  Findings: Progression of bilateral airspace disease, most prominent in the right base.  Findings are most compatible with pneumonia. Edema is less likely.  No pleural effusion.  IMPRESSION: Progression of bilateral infiltrates.  Original Report Authenticated By: Camelia Phenes, M.D.      Medications: I have reviewed the patient's current medications.  Impression: 1. Exacerbation of COPD with respiratory failure, exacerbated by bilateral pneumonia. 2. Hypertension. 3. Type 2 diabetes mellitus, uncontrolled. 4. Ongoing tobacco  abuse. 5. Obesity. 6.Depression.     Plan: 1. Continue current intravenous steroids and antibiotics. 2. Agree with addition of vancomycin to her antibiotic regimen. 3. Increase insulin to control diabetes better. 4. She needs to remain in intensive care unit today as she is still requiring BiPAP.     LOS: 4 days   GOSRANI,NIMISH C 03/24/2011, 8:34 AM

## 2011-03-24 NOTE — Progress Notes (Signed)
ANTIBIOTIC CONSULT NOTE -  Pharmacy Consult for  Vancomycin Indication: PNA  Allergies  Allergen Reactions  . Codeine Nausea Only   Patient Measurements: Height: 5\' 4"  (162.6 cm) Weight: 297 lb 9.9 oz (135 kg) IBW/kg (Calculated) : 54.7  Adjusted Body Weight:100kg Vital Signs:  Intake/Output from previous day: 11/29 0701 - 11/30 0700 In: 3587.1 [P.O.:1800; I.V.:1187.1; IV Piggyback:600] Out: 701 [Urine:700; Stool:1] Intake/Output from this shift:    Labs:  Basename 03/24/11 0443 03/23/11 0416 03/22/11 0452  WBC 20.6* 20.9* --  HGB 8.6* 8.4* --  PLT 369 371 --  LABCREA -- -- --  CREATININE 0.60 0.69 0.73   Estimated Creatinine Clearance: 121.7 ml/min (by C-G formula based on Cr of 0.6).    Microbiology: Recent Results (from the past 720 hour(s))  CULTURE, BLOOD (ROUTINE X 2)     Status: Normal (Preliminary result)   Collection Time   03/20/11  9:35 PM      Component Value Range Status Comment   Specimen Description BLOOD LEFT HAND   Final    Special Requests BOTTLES DRAWN AEROBIC AND ANAEROBIC 6CC   Final    Culture NO GROWTH 3 DAYS   Final    Report Status PENDING   Incomplete   CULTURE, BLOOD (ROUTINE X 2)     Status: Normal (Preliminary result)   Collection Time   03/20/11  9:35 PM      Component Value Range Status Comment   Specimen Description RIGHT ANTECUBITAL   Final    Special Requests BOTTLES DRAWN AEROBIC AND ANAEROBIC 6CC EACH   Final    Culture NO GROWTH 3 DAYS   Final    Report Status PENDING   Incomplete   URINE CULTURE     Status: Normal   Collection Time   03/20/11 10:57 PM      Component Value Range Status Comment   Specimen Description URINE, CATHETERIZED   Final    Special Requests NONE   Final    Setup Time [id number]   Final    Colony Count NO GROWTH   Final    Culture NO GROWTH   Final    Report Status 03/22/2011 FINAL   Final   MRSA PCR SCREENING     Status: Normal   Collection Time   03/21/11  8:56 AM      Component Value  Range Status Comment   MRSA by PCR NEGATIVE  NEGATIVE  Final     Medical History: Past Medical History  Diagnosis Date  . Diabetes mellitus   . COPD (chronic obstructive pulmonary disease)   . HTN (hypertension)   . Low back pain   . Tachycardia     never had test done since no insurance  . Depression   . Asthma   . Shortness of breath   . Anxiety   . Gastric erosions     EGD 08/2010.  . Internal hemorrhoids     Colonoscopy 5/12.  [name] Heavy menses    Assessment: Renal fxn OK (clcr > 100)  Goal of Therapy:  Vancomycin trough level 15-20 mcg/ml  Plan Vancomycin 1000 mg IV every 8hrs (d/w Paco) Check trough tomorrow am Labs per protocol   Valrie Hart A 03/24/2011,8:16 AM

## 2011-03-24 NOTE — Progress Notes (Signed)
Subjective: She looks a little bit better this morning. She is still short of breath and coughing but not as badly.  Objective: Vital signs in last 24 hours: Temp:  [97.5 F (36.4 C)-98 F (36.7 C)] 97.8 F (36.6 C) (11/30 0418) Pulse Rate:  [73-108] 79  (11/30 0600) Resp:  [16-35] 18  (11/30 0600) BP: (121-135)/(44-99) 135/70 mmHg (11/30 0600) SpO2:  [88 %-100 %] 98 % (11/30 0722) FiO2 (%):  [55 %] 55 % (11/30 0120) Weight:  [135 kg (297 lb 9.9 oz)] 297 lb 9.9 oz (135 kg) (11/30 0500) Weight change: 1.7 kg (3 lb 12 oz) Last BM Date: 03/23/11  Intake/Output from previous day: 11/29 0701 - 11/30 0700 In: 3587.1 [P.O.:1800; I.V.:1187.1; IV Piggyback:600] Out: 701 [Urine:700; Stool:1]  PHYSICAL EXAM General appearance: alert, cooperative and mild distress Resp: rhonchi bilaterally Cardio: regular rate and rhythm, S1, S2 normal, no murmur, click, rub or gallop GI: soft, non-tender; bowel sounds normal; no masses,  no organomegaly Extremities: extremities normal, atraumatic, no cyanosis or edema  Lab Results:    Basic Metabolic Panel:  Basename 03/24/11 0443 03/23/11 2152 03/23/11 0416  NA 137 -- 135  K 3.6 -- 3.9  CL 100 -- 101  CO2 27 -- 27  GLUCOSE 239* 414* --  BUN 12 -- 17  CREATININE 0.60 -- 0.69  CALCIUM 9.0 -- 8.8  MG -- -- --  PHOS -- -- --   Liver Function Tests:  Basename 03/23/11 0416  AST 9  ALT 10  ALKPHOS 64  BILITOT 0.1*  PROT 6.9  ALBUMIN 2.7*   No results found for this basename: LIPASE:2,AMYLASE:2 in the last 72 hours No results found for this basename: AMMONIA:2 in the last 72 hours CBC:  Basename 03/24/11 0443 03/23/11 0416  WBC 20.6* 20.9*  NEUTROABS -- --  HGB 8.6* 8.4*  HCT 30.1* 29.2*  MCV 72.5* 72.3*  PLT 369 371   Cardiac Enzymes:  Basename 03/22/11 0001 03/21/11 1537  CKTOTAL 45 49  CKMB 2.3 2.3  CKMBINDEX -- --  TROPONINI <0.30 <0.30   BNP: No results found for this basename: POCBNP:3 in the last 72  hours D-Dimer: No results found for this basename: DDIMER:2 in the last 72 hours CBG:  Basename 03/24/11 0718 03/23/11 2140 03/23/11 1641 03/23/11 1158 03/23/11 0735 03/22/11 2120  GLUCAP 255* 406* 285* 302* 283* 267*   Hemoglobin A1C: No results found for this basename: HGBA1C in the last 72 hours Fasting Lipid Panel: No results found for this basename: CHOL,HDL,LDLCALC,TRIG,CHOLHDL,LDLDIRECT in the last 72 hours Thyroid Function Tests: No results found for this basename: TSH,T4TOTAL,FREET4,T3FREE,THYROIDAB in the last 72 hours Anemia Panel: No results found for this basename: VITAMINB12,FOLATE,FERRITIN,TIBC,IRON,RETICCTPCT in the last 72 hours Coagulation: No results found for this basename: LABPROT:2,INR:2 in the last 72 hours Urine Drug Screen: Drugs of Abuse     Component Value Date/Time   LABOPIA POSITIVE* 10/28/2007 2113   COCAINSCRNUR NONE DETECTED 10/28/2007 2113   LABBENZ NONE DETECTED 10/28/2007 2113   AMPHETMU NONE DETECTED 10/28/2007 2113   THCU POSITIVE* 10/28/2007 2113   LABBARB  Value: NONE DETECTED        DRUG SCREEN FOR MEDICAL PURPOSES ONLY.  IF CONFIRMATION IS NEEDED FOR ANY PURPOSE, NOTIFY LAB WITHIN 5 DAYS. 10/28/2007 2113    Alcohol Level: No results found for this basename: ETH:2 in the last 72 hours Urinalysis:  Misc. Labs:  ABGS  Basename 03/24/11 0430  PHART 7.321*  PO2ART 146.0*  TCO2 26.2  HCO3 27.3*  CULTURES Recent Results (from the past 240 hour(s))  CULTURE, BLOOD (ROUTINE X 2)     Status: Normal (Preliminary result)   Collection Time   03/20/11  9:35 PM      Component Value Range Status Comment   Specimen Description BLOOD LEFT HAND   Final    Special Requests BOTTLES DRAWN AEROBIC AND ANAEROBIC 6CC   Final    Culture NO GROWTH 3 DAYS   Final    Report Status PENDING   Incomplete   CULTURE, BLOOD (ROUTINE X 2)     Status: Normal (Preliminary result)   Collection Time   03/20/11  9:35 PM      Component Value Range Status Comment    Specimen Description RIGHT ANTECUBITAL   Final    Special Requests BOTTLES DRAWN AEROBIC AND ANAEROBIC 6CC EACH   Final    Culture NO GROWTH 3 DAYS   Final    Report Status PENDING   Incomplete   URINE CULTURE     Status: Normal   Collection Time   03/20/11 10:57 PM      Component Value Range Status Comment   Specimen Description URINE, CATHETERIZED   Final    Special Requests NONE   Final    Setup Time [id number]   Final    Colony Count NO GROWTH   Final    Culture NO GROWTH   Final    Report Status 03/22/2011 FINAL   Final   MRSA PCR SCREENING     Status: Normal   Collection Time   03/21/11  8:56 AM      Component Value Range Status Comment   MRSA by PCR NEGATIVE  NEGATIVE  Final    Studies/Results: Dg Chest 2 View  03/22/2011  *RADIOLOGY REPORT*  Clinical Data: Low back pain.  Follow-up pneumonia.  CHEST - 2 VIEW  Comparison: 03/20/2011  Findings: Chronic peribronchial thickening.  Focal opacity in the right middle lobe could represent pneumonia.  Left lung is clear. Heart is normal size.  No effusions.  IMPRESSION: Chronic bronchitic changes.  Probable right middle lobe pneumonia.  Original Report Authenticated By: Cyndie Chime, M.D.   Dg Lumbar Spine 2-3 Views  03/22/2011  *RADIOLOGY REPORT*  Clinical Data: Low back pain,  LUMBAR SPINE - 2-3 VIEW  Comparison: CT 09/01/2010  Findings: There is normal alignment of the lumbar vertebral bodies. There is mild endplate spurring at L1-L2.  No subluxation.  No loss of vertebral body height or disc height.  IMPRESSION: Mild disc osteophytic disease.  Original Report Authenticated By: Genevive Bi, M.D.   Dg Chest Port 1 View  03/23/2011  *RADIOLOGY REPORT*  Clinical Data: Pneumonia  PORTABLE CHEST - 1 VIEW  Comparison: 03/22/2011  Findings: Progression of bilateral airspace disease, most prominent in the right base.  Findings are most compatible with pneumonia. Edema is less likely.  No pleural effusion.  IMPRESSION: Progression of  bilateral infiltrates.  Original Report Authenticated By: Camelia Phenes, M.D.    Medications:  Scheduled:   . antiseptic oral rinse  15 mL Mouth Rinse q12n4p  . azithromycin  500 mg Intravenous Q24H  . cefTRIAXone (ROCEPHIN)  IV  1 g Intravenous Q24H  . chlorhexidine  15 mL Mouth Rinse BID  . citalopram  10 mg Oral Daily  . enoxaparin  40 mg Subcutaneous Daily  . fluticasone  2 spray Each Nare Daily  . Fluticasone-Salmeterol  1 puff Inhalation BID  . guaiFENesin  1,200 mg Oral BID  .  insulin aspart  0-15 Units Subcutaneous TID WC  . insulin aspart  0-5 Units Subcutaneous QHS  . insulin aspart  10 Units Subcutaneous Once  . insulin glargine  20 Units Subcutaneous QHS  . levalbuterol  0.63 mg Nebulization Q6H  . lisinopril  5 mg Oral Daily  . methylPREDNISolone (SOLU-MEDROL) injection  125 mg Intravenous Q6H  . multivitamins ther. w/minerals  1 tablet Oral Daily  . nicotine  14 mg Transdermal Daily  . pantoprazole  40 mg Oral BID AC  . vancomycin  1,000 mg Intravenous Q1 Hr x 2  . vancomycin  1,000 mg Intravenous Q8H   Continuous:   . sodium chloride 50 mL/hr (03/23/11 1740)   WUJ:[id number], acetaminophen, levalbuterol, LORazepam, morphine, ondansetron (ZOFRAN) IV, ondansetron, oxyCODONE  Assesment: She has respiratory failure but seems a little better. She has multiple other medical problems Principal Problem:  *Respiratory failure with hypoxia Active Problems:  COPD with acute exacerbation  HTN (hypertension)  DM type 2 (diabetes mellitus, type 2)  Tobacco abuse  Bilateral pneumonia  Sinus tachycardia  Hyponatremia    Plan: No change in treatments she does seem to have improved somewhat    LOS: 4 days   Kuper Rennels L 03/24/2011, 7:51 AM

## 2011-03-25 LAB — CBC
HCT: 29.1 % — ABNORMAL LOW (ref 36.0–46.0)
Hemoglobin: 8.1 g/dL — ABNORMAL LOW (ref 12.0–15.0)
MCH: 20.1 pg — ABNORMAL LOW (ref 26.0–34.0)
MCHC: 27.8 g/dL — ABNORMAL LOW (ref 30.0–36.0)
MCV: 72.4 fL — ABNORMAL LOW (ref 78.0–100.0)
Platelets: 317 10*3/uL (ref 150–400)
RBC: 4.02 MIL/uL (ref 3.87–5.11)
RDW: 17.5 % — ABNORMAL HIGH (ref 11.5–15.5)
WBC: 16.5 10*3/uL — ABNORMAL HIGH (ref 4.0–10.5)

## 2011-03-25 LAB — GLUCOSE, CAPILLARY
Glucose-Capillary: 235 mg/dL — ABNORMAL HIGH (ref 70–99)
Glucose-Capillary: 345 mg/dL — ABNORMAL HIGH (ref 70–99)
Glucose-Capillary: 253 mg/dL — ABNORMAL HIGH (ref 70–99)
Glucose-Capillary: 276 mg/dL — ABNORMAL HIGH (ref 70–99)

## 2011-03-25 LAB — BASIC METABOLIC PANEL
BUN: 11 mg/dL (ref 6–23)
CO2: 33 mEq/L — ABNORMAL HIGH (ref 19–32)
Calcium: 8.6 mg/dL (ref 8.4–10.5)
Chloride: 98 mEq/L (ref 96–112)
Creatinine, Ser: 0.53 mg/dL (ref 0.50–1.10)
GFR calc Af Amer: >90 mL/min (ref >90)
GFR calc non Af Amer: >90 mL/min (ref >90)
Glucose, Bld: 224 mg/dL — ABNORMAL HIGH (ref 70–99)
Potassium: 3.7 mEq/L (ref 3.5–5.1)
Sodium: 136 mEq/L (ref 135–145)

## 2011-03-25 LAB — CULTURE, BLOOD (ROUTINE X 2)
Culture: NO GROWTH
Culture: NO GROWTH

## 2011-03-25 LAB — VANCOMYCIN, TROUGH: Vancomycin Tr: 14.2 ug/mL (ref 10.0–20.0)

## 2011-03-25 MED ORDER — METHYLPREDNISOLONE SODIUM SUCC 125 MG IJ SOLR
80.0000 mg | Freq: Three times a day (TID) | INTRAMUSCULAR | Status: DC
  Administered 2011-03-25 – 2011-03-26 (×4): 80 mg via INTRAVENOUS
  Filled 2011-03-25 (×4): qty 2

## 2011-03-25 MED ORDER — VANCOMYCIN HCL 1000 MG IV SOLR
1250.0000 mg | Freq: Three times a day (TID) | INTRAVENOUS | Status: DC
  Administered 2011-03-25 – 2011-03-27 (×5): 1250 mg via INTRAVENOUS
  Filled 2011-03-25 (×13): qty 1250

## 2011-03-25 MED ORDER — MOXIFLOXACIN HCL 400 MG PO TABS
400.0000 mg | ORAL_TABLET | Freq: Every day | ORAL | Status: DC
  Administered 2011-03-25 – 2011-03-26 (×2): 400 mg via ORAL
  Filled 2011-03-25 (×2): qty 1

## 2011-03-25 MED ORDER — SODIUM CHLORIDE 0.9 % IJ SOLN
INTRAMUSCULAR | Status: AC
  Administered 2011-03-25: 10 mL
  Filled 2011-03-25: qty 3

## 2011-03-25 MED ORDER — METFORMIN HCL 500 MG PO TABS
500.0000 mg | ORAL_TABLET | Freq: Two times a day (BID) | ORAL | Status: DC
  Administered 2011-03-25 – 2011-03-26 (×3): 500 mg via ORAL
  Filled 2011-03-25 (×3): qty 1

## 2011-03-25 NOTE — Progress Notes (Signed)
Subjective: She's doing better. She says she's not as short of breath she's not coughing as much and overall she looks much better. She is up and moving around).  Objective: Vital signs in last 24 hours: Temp:  [97.6 F (36.4 C)-98.1 F (36.7 C)] 98.1 F (36.7 C) (12/01 0800) Pulse Rate:  [58-113] 83  (12/01 0800) Resp:  [16-29] 16  (12/01 0800) BP: (105-158)/(47-126) 131/70 mmHg (12/01 0800) SpO2:  [87 %-100 %] 92 % (12/01 0800) Weight change:  Last BM Date: 03/23/11  Intake/Output from previous day: 11/30 0701 - 12/01 0700 In: 3231 [P.O.:1530; I.V.:601; IV Piggyback:1100] Out: 5 [Urine:5]  PHYSICAL EXAM General appearance: alert, cooperative and no distress Resp: rhonchi bilaterally and wheezes bilaterally Cardio: regular rate and rhythm, S1, S2 normal, no murmur, click, rub or gallop GI: soft, non-tender; bowel sounds normal; no masses,  no organomegaly Extremities: extremities normal, atraumatic, no cyanosis or edema  Lab Results:    Basic Metabolic Panel:  Basename 03/25/11 0644 03/24/11 2256 03/24/11 0443  NA 136 -- 137  K 3.7 -- 3.6  CL 98 -- 100  CO2 33* -- 27  GLUCOSE 224* 353* --  BUN 11 -- 12  CREATININE 0.53 -- 0.60  CALCIUM 8.6 -- 9.0  MG -- -- --  PHOS -- -- --   Liver Function Tests:  Basename 03/23/11 0416  AST 9  ALT 10  ALKPHOS 64  BILITOT 0.1*  PROT 6.9  ALBUMIN 2.7*   No results found for this basename: LIPASE:2,AMYLASE:2 in the last 72 hours No results found for this basename: AMMONIA:2 in the last 72 hours CBC:  Basename 03/25/11 0644 03/24/11 0443  WBC 16.5* 20.6*  NEUTROABS -- --  HGB 8.1* 8.6*  HCT 29.1* 30.1*  MCV 72.4* 72.5*  PLT 317 369   Cardiac Enzymes: No results found for this basename: CKTOTAL:3,CKMB:3,CKMBINDEX:3,TROPONINI:3 in the last 72 hours BNP: No results found for this basename: POCBNP:3 in the last 72 hours D-Dimer: No results found for this basename: DDIMER:2 in the last 72 hours CBG:  Basename  03/25/11 0729 03/24/11 2236 03/24/11 1640 03/24/11 1128 03/24/11 0718 03/23/11 2140  GLUCAP 235* 410* 416* 394* 255* 406*   Hemoglobin A1C: No results found for this basename: HGBA1C in the last 72 hours Fasting Lipid Panel: No results found for this basename: CHOL,HDL,LDLCALC,TRIG,CHOLHDL,LDLDIRECT in the last 72 hours Thyroid Function Tests: No results found for this basename: TSH,T4TOTAL,FREET4,T3FREE,THYROIDAB in the last 72 hours Anemia Panel: No results found for this basename: VITAMINB12,FOLATE,FERRITIN,TIBC,IRON,RETICCTPCT in the last 72 hours Coagulation: No results found for this basename: LABPROT:2,INR:2 in the last 72 hours Urine Drug Screen: Drugs of Abuse     Component Value Date/Time   LABOPIA POSITIVE* 10/28/2007 2113   COCAINSCRNUR NONE DETECTED 10/28/2007 2113   LABBENZ NONE DETECTED 10/28/2007 2113   AMPHETMU NONE DETECTED 10/28/2007 2113   THCU POSITIVE* 10/28/2007 2113   LABBARB  Value: NONE DETECTED        DRUG SCREEN FOR MEDICAL PURPOSES ONLY.  IF CONFIRMATION IS NEEDED FOR ANY PURPOSE, NOTIFY LAB WITHIN 5 DAYS. 10/28/2007 2113    Alcohol Level: No results found for this basename: ETH:2 in the last 72 hours Urinalysis:  Misc. Labs:  ABGS  Basename 03/24/11 0430  PHART 7.321*  PO2ART 146.0*  TCO2 26.2  HCO3 27.3*   CULTURES Recent Results (from the past 240 hour(s))  CULTURE, BLOOD (ROUTINE X 2)     Status: Normal (Preliminary result)   Collection Time   03/20/11  9:35  PM      Component Value Range Status Comment   Specimen Description BLOOD LEFT HAND   Final    Special Requests BOTTLES DRAWN AEROBIC AND ANAEROBIC 6CC   Final    Culture NO GROWTH 3 DAYS   Final    Report Status PENDING   Incomplete   CULTURE, BLOOD (ROUTINE X 2)     Status: Normal (Preliminary result)   Collection Time   03/20/11  9:35 PM      Component Value Range Status Comment   Specimen Description RIGHT ANTECUBITAL   Final    Special Requests BOTTLES DRAWN AEROBIC AND ANAEROBIC 6CC  EACH   Final    Culture NO GROWTH 3 DAYS   Final    Report Status PENDING   Incomplete   URINE CULTURE     Status: Normal   Collection Time   03/20/11 10:57 PM      Component Value Range Status Comment   Specimen Description URINE, CATHETERIZED   Final    Special Requests NONE   Final    Setup Time [id number]   Final    Colony Count NO GROWTH   Final    Culture NO GROWTH   Final    Report Status 03/22/2011 FINAL   Final   MRSA PCR SCREENING     Status: Normal   Collection Time   03/21/11  8:56 AM      Component Value Range Status Comment   MRSA by PCR NEGATIVE  NEGATIVE  Final    Studies/Results: Dg Chest Port 1 View  03/23/2011  *RADIOLOGY REPORT*  Clinical Data: Pneumonia  PORTABLE CHEST - 1 VIEW  Comparison: 03/22/2011  Findings: Progression of bilateral airspace disease, most prominent in the right base.  Findings are most compatible with pneumonia. Edema is less likely.  No pleural effusion.  IMPRESSION: Progression of bilateral infiltrates.  Original Report Authenticated By: Camelia Phenes, M.D.    Medications:  Scheduled:   . antiseptic oral rinse  15 mL Mouth Rinse q12n4p  . chlorhexidine  15 mL Mouth Rinse BID  . citalopram  10 mg Oral Daily  . enoxaparin  40 mg Subcutaneous Daily  . fluticasone  2 spray Each Nare Daily  . Fluticasone-Salmeterol  1 puff Inhalation BID  . guaiFENesin  1,200 mg Oral BID  . insulin aspart  0-15 Units Subcutaneous TID WC  . insulin aspart  0-5 Units Subcutaneous QHS  . insulin glargine  40 Units Subcutaneous QHS  . levalbuterol  0.63 mg Nebulization Q6H  . lisinopril  5 mg Oral Daily  . methylPREDNISolone (SOLU-MEDROL) injection  80 mg Intravenous Q8H  . moxifloxacin  400 mg Oral q1800  . multivitamins ther. w/minerals  1 tablet Oral Daily  . nicotine  14 mg Transdermal Daily  . pantoprazole  40 mg Oral BID AC  . vancomycin  1,000 mg Intravenous Q8H  . DISCONTD: azithromycin  500 mg Intravenous Q24H  . DISCONTD: cefTRIAXone  (ROCEPHIN)  IV  1 g Intravenous Q24H  . DISCONTD: methylPREDNISolone (SOLU-MEDROL) injection  125 mg Intravenous Q6H   Continuous:   . sodium chloride 20 mL/hr at 03/25/11 0800   WGN:[id number], acetaminophen, levalbuterol, LORazepam, morphine, ondansetron (ZOFRAN) IV, ondansetron, oxyCODONE  Assesment: She has COPD in respiratory failure which is much improved. I agree with plans to move her from the ICU Principal Problem:  *Respiratory failure with hypoxia Active Problems:  COPD with acute exacerbation  HTN (hypertension)  DM type 2 (diabetes mellitus, type  2)  Tobacco abuse  Bilateral pneumonia  Sinus tachycardia  Hyponatremia    Plan: She will move from the ICU continue with her other treatments.    LOS: 5 days   [name] 03/25/2011, 9:08 AM

## 2011-03-25 NOTE — Progress Notes (Signed)
ANTIBIOTIC CONSULT NOTE -  Pharmacy Consult for  Vancomycin Indication: PNA  Allergies  Allergen Reactions  . Codeine Nausea Only   Patient Measurements: Height: 5\' 4"  (162.6 cm) Weight: 297 lb 9.9 oz (135 kg) IBW/kg (Calculated) : 54.7  Adjusted Body Weight:100kg Vital Signs:  Intake/Output from previous day: 11/30 0701 - 12/01 0700 In: 3231 [P.O.:1530; I.V.:601; IV Piggyback:1100] Out: 5 [Urine:5] Intake/Output from this shift: Total I/O In: 560 [P.O.:480; I.V.:80] Out: -   Labs:  Basename 03/25/11 0644 03/24/11 0443 03/23/11 0416  WBC 16.5* 20.6* 20.9*  HGB 8.1* 8.6* 8.4*  PLT 317 369 371  LABCREA -- -- --  CREATININE 0.53 0.60 0.69   Estimated Creatinine Clearance: 121.7 ml/min (by C-G formula based on Cr of 0.53).    Microbiology: Recent Results (from the past 720 hour(s))  CULTURE, BLOOD (ROUTINE X 2)     Status: Normal   Collection Time   03/20/11  9:35 PM      Component Value Range Status Comment   Specimen Description BLOOD LEFT HAND   Final    Special Requests BOTTLES DRAWN AEROBIC AND ANAEROBIC 6CC   Final    Culture NO GROWTH 5 DAYS   Final    Report Status 03/25/2011 FINAL   Final   CULTURE, BLOOD (ROUTINE X 2)     Status: Normal   Collection Time   03/20/11  9:35 PM      Component Value Range Status Comment   Specimen Description RIGHT ANTECUBITAL   Final    Special Requests BOTTLES DRAWN AEROBIC AND ANAEROBIC [facility] EACH   Final    Culture NO GROWTH 5 DAYS   Final    Report Status 03/25/2011 FINAL   Final   URINE CULTURE     Status: Normal   Collection Time   03/20/11 10:57 PM      Component Value Range Status Comment   Specimen Description URINE, CATHETERIZED   Final    Special Requests NONE   Final    Setup Time [id number]   Final    Colony Count NO GROWTH   Final    Culture NO GROWTH   Final    Report Status 03/22/2011 FINAL   Final   MRSA PCR SCREENING     Status: Normal   Collection Time   03/21/11  8:56 AM      Component Value  Range Status Comment   MRSA by PCR NEGATIVE  NEGATIVE  Final     Medical History: Past Medical History  Diagnosis Date  . Diabetes mellitus   . COPD (chronic obstructive pulmonary disease)   . HTN (hypertension)   . Low back pain   . Tachycardia     never had test done since no insurance  . Depression   . Asthma   . Shortness of breath   . Anxiety   . Gastric erosions     EGD 08/2010.  . Internal hemorrhoids     Colonoscopy 5/12.  [name] Heavy menses    Assessment: Renal fxn OK (clcr > 100) Trough slightly low @ 14.2  Goal of Therapy:  Vancomycin trough level 15-20 mcg/ml  Plan Change Vancomycin 1250 mg IV every 8hrs. Repeat trough 03/27/11  Labs per protocol   Lamonte Richer R 03/25/2011,11:58 AM

## 2011-03-25 NOTE — Progress Notes (Signed)
Subjective: This lady feels better today. She did not require much BiPAP last night, only a couple of hours. She also admits that she feels better. She is coughing up thick yellow sputum.          Physical Exam: Blood pressure 135/55, pulse 69, temperature 97.6 F (36.4 C), temperature source Oral, resp. rate 18, height 5\' 4"  (1.626 m), weight 135 kg (297 lb 9.9 oz), last menstrual period 02/27/2011, SpO2 91.00%. She is walking around on 4 L oxygen per minute. Lung fields show bilateral wheezing as before but not tight. Heart sounds are in sinus rhythm. She is alert and orientated.   Investigations:  Recent Results (from the past 240 hour(s))  CULTURE, BLOOD (ROUTINE X 2)     Status: Normal (Preliminary result)   Collection Time   03/20/11  9:35 PM      Component Value Range Status Comment   Specimen Description BLOOD LEFT HAND   Final    Special Requests BOTTLES DRAWN AEROBIC AND ANAEROBIC 6CC   Final    Culture NO GROWTH 3 DAYS   Final    Report Status PENDING   Incomplete   CULTURE, BLOOD (ROUTINE X 2)     Status: Normal (Preliminary result)   Collection Time   03/20/11  9:35 PM      Component Value Range Status Comment   Specimen Description RIGHT ANTECUBITAL   Final    Special Requests BOTTLES DRAWN AEROBIC AND ANAEROBIC 6CC EACH   Final    Culture NO GROWTH 3 DAYS   Final    Report Status PENDING   Incomplete   URINE CULTURE     Status: Normal   Collection Time   03/20/11 10:57 PM      Component Value Range Status Comment   Specimen Description URINE, CATHETERIZED   Final    Special Requests NONE   Final    Setup Time [id number]   Final    Colony Count NO GROWTH   Final    Culture NO GROWTH   Final    Report Status 03/22/2011 FINAL   Final   MRSA PCR SCREENING     Status: Normal   Collection Time   03/21/11  8:56 AM      Component Value Range Status Comment   MRSA by PCR NEGATIVE  NEGATIVE  Final      Basic Metabolic Panel:  Basename 03/24/11 2256  03/24/11 1720 03/24/11 0443 03/23/11 0416  NA -- -- 137 135  K -- -- 3.6 3.9  CL -- -- 100 101  CO2 -- -- 27 27  GLUCOSE 353* 385* -- --  BUN -- -- 12 17  CREATININE -- -- 0.60 0.69  CALCIUM -- -- 9.0 8.8  MG -- -- -- --  PHOS -- -- -- --   Liver Function Tests:  [facility] 03/23/11 0416  AST 9  ALT 10  ALKPHOS 64  BILITOT 0.1*  PROT 6.9  ALBUMIN 2.7*     CBC:  Basename 03/25/11 0644 03/24/11 0443  WBC 16.5* 20.6*  NEUTROABS -- --  HGB 8.1* 8.6*  HCT 29.1* 30.1*  MCV 72.4* 72.5*  PLT 317 369    Dg Chest Port 1 View  03/23/2011  *RADIOLOGY REPORT*  Clinical Data: Pneumonia  PORTABLE CHEST - 1 VIEW  Comparison: 03/22/2011  Findings: Progression of bilateral airspace disease, most prominent in the right base.  Findings are most compatible with pneumonia. Edema is less likely.  No pleural effusion.  IMPRESSION:  Progression of bilateral infiltrates.  Original Report Authenticated By: Camelia Phenes, M.D.      Medications: I have reviewed the patient's current medications.  Impression: 1. Exacerbation of COPD with respiratory failure, exacerbated by bilateral pneumonia. 2. Hypertension. 3. Type 2 diabetes mellitus, uncontrolled. 4. Ongoing tobacco abuse. 5. Obesity. 6.Depression.     Plan: 1. Reduce intravenous steroids. 2. Modify antibiotics and now she will continue with intravenous vancomycin and oral Avelox. 3. Moved to regular medical floor.     LOS: 5 days   Magnum Lunde C 03/25/2011, 7:19 AM

## 2011-03-26 ENCOUNTER — Inpatient Hospital Stay (HOSPITAL_COMMUNITY): Payer: Medicaid Other

## 2011-03-26 LAB — COMPREHENSIVE METABOLIC PANEL
ALT: 66 U/L — ABNORMAL HIGH (ref 0–35)
AST: 24 U/L (ref 0–37)
Albumin: 2.7 g/dL — ABNORMAL LOW (ref 3.5–5.2)
Alkaline Phosphatase: 40 U/L (ref 39–117)
BUN: 13 mg/dL (ref 6–23)
CO2: 35 mEq/L — ABNORMAL HIGH (ref 19–32)
Calcium: 8.6 mg/dL (ref 8.4–10.5)
Chloride: 99 mEq/L (ref 96–112)
Creatinine, Ser: 0.57 mg/dL (ref 0.50–1.10)
GFR calc Af Amer: >90 mL/min (ref >90)
GFR calc non Af Amer: >90 mL/min (ref >90)
Glucose, Bld: 232 mg/dL — ABNORMAL HIGH (ref 70–99)
Potassium: 3.8 mEq/L (ref 3.5–5.1)
Sodium: 137 mEq/L (ref 135–145)
Total Bilirubin: 0.2 mg/dL — ABNORMAL LOW (ref 0.3–1.2)
Total Protein: 6.2 g/dL (ref 6.0–8.3)

## 2011-03-26 LAB — CBC
HCT: 29.7 % — ABNORMAL LOW (ref 36.0–46.0)
Hemoglobin: 8.7 g/dL — ABNORMAL LOW (ref 12.0–15.0)
MCH: 21.1 pg — ABNORMAL LOW (ref 26.0–34.0)
MCHC: 29.3 g/dL — ABNORMAL LOW (ref 30.0–36.0)
MCV: 72.1 fL — ABNORMAL LOW (ref 78.0–100.0)
Platelets: 332 10*3/uL (ref 150–400)
RBC: 4.12 MIL/uL (ref 3.87–5.11)
RDW: 17.6 % — ABNORMAL HIGH (ref 11.5–15.5)
WBC: 19.9 10*3/uL — ABNORMAL HIGH (ref 4.0–10.5)

## 2011-03-26 LAB — GLUCOSE, CAPILLARY
Glucose-Capillary: 212 mg/dL — ABNORMAL HIGH (ref 70–99)
Glucose-Capillary: 288 mg/dL — ABNORMAL HIGH (ref 70–99)
Glucose-Capillary: 227 mg/dL — ABNORMAL HIGH (ref 70–99)
Glucose-Capillary: 195 mg/dL — ABNORMAL HIGH (ref 70–99)

## 2011-03-26 MED ORDER — PREDNISONE 20 MG PO TABS
40.0000 mg | ORAL_TABLET | Freq: Every day | ORAL | Status: DC
  Administered 2011-03-26 – 2011-03-27 (×2): 40 mg via ORAL
  Filled 2011-03-26 (×2): qty 1
  Filled 2011-03-26: qty 2

## 2011-03-26 MED ORDER — SODIUM CHLORIDE 0.9 % IJ SOLN
INTRAMUSCULAR | Status: AC
  Administered 2011-03-26: 14:00:00
  Filled 2011-03-26: qty 3

## 2011-03-26 MED ORDER — SODIUM CHLORIDE 0.9 % IJ SOLN
INTRAMUSCULAR | Status: AC
  Administered 2011-03-27
  Filled 2011-03-26: qty 3

## 2011-03-26 MED ORDER — SODIUM CHLORIDE 0.9 % IJ SOLN
INTRAMUSCULAR | Status: AC
  Administered 2011-03-26: 10 mL
  Filled 2011-03-26: qty 3

## 2011-03-26 NOTE — Progress Notes (Signed)
Subjective: There is little change. She continues to clear as far as her chest is concerned that she is complaining of back pain  Objective: Vital signs in last 24 hours: Temp:  [97.9 F (36.6 C)-98.1 F (36.7 C)] 97.9 F (36.6 C) (12/02 0600) Pulse Rate:  [80-90] 80  (12/02 0600) Resp:  [16-20] 20  (12/02 0600) BP: (134-135)/(82) 134/82 mmHg (12/02 0600) SpO2:  [96 %-97 %] 96 % (12/02 0600) Weight change:  Last BM Date: 03/25/11  Intake/Output from previous day: 12/01 0701 - 12/02 0700 In: 1792 [P.O.:1200; I.V.:340; IV Piggyback:252] Out: -   PHYSICAL EXAM General appearance: alert, mild distress and morbidly obese Resp: wheezes bilaterally and posterior - bilateral Cardio: regular rate and rhythm, S1, S2 normal, no murmur, click, rub or gallop GI: soft, non-tender; bowel sounds normal; no masses,  no organomegaly Extremities: extremities normal, atraumatic, no cyanosis or edema  Lab Results:    Basic Metabolic Panel:  Basename 03/26/11 0520 03/25/11 0644  NA 137 136  K 3.8 3.7  CL 99 98  CO2 35* 33*  GLUCOSE 232* 224*  BUN 13 11  CREATININE 0.57 0.53  CALCIUM 8.6 8.6  MG -- --  PHOS -- --   Liver Function Tests:  [facility] 03/26/11 0520  AST 24  ALT 66*  ALKPHOS 40  BILITOT 0.2*  PROT 6.2  ALBUMIN 2.7*   No results found for this basename: LIPASE:2,AMYLASE:2 in the last 72 hours No results found for this basename: AMMONIA:2 in the last 72 hours CBC:  Basename 03/26/11 0520 03/25/11 0644  WBC 19.9* 16.5*  NEUTROABS -- --  HGB 8.7* 8.1*  HCT 29.7* 29.1*  MCV 72.1* 72.4*  PLT 332 317   Cardiac Enzymes: No results found for this basename: CKTOTAL:3,CKMB:3,CKMBINDEX:3,TROPONINI:3 in the last 72 hours BNP: No results found for this basename: POCBNP:3 in the last 72 hours D-Dimer: No results found for this basename: DDIMER:2 in the last 72 hours CBG:  Basename 03/26/11 0730 03/25/11 2113 03/25/11 1613 03/25/11 1156 03/25/11 0729 03/24/11 2236    GLUCAP 212* 276* 253* 345* 235* 410*   Hemoglobin A1C: No results found for this basename: HGBA1C in the last 72 hours Fasting Lipid Panel: No results found for this basename: CHOL,HDL,LDLCALC,TRIG,CHOLHDL,LDLDIRECT in the last 72 hours Thyroid Function Tests: No results found for this basename: TSH,T4TOTAL,FREET4,T3FREE,THYROIDAB in the last 72 hours Anemia Panel: No results found for this basename: VITAMINB12,FOLATE,FERRITIN,TIBC,IRON,RETICCTPCT in the last 72 hours Coagulation: No results found for this basename: LABPROT:2,INR:2 in the last 72 hours Urine Drug Screen: Drugs of Abuse     Component Value Date/Time   LABOPIA POSITIVE* 10/28/2007 2113   COCAINSCRNUR NONE DETECTED 10/28/2007 2113   LABBENZ NONE DETECTED 10/28/2007 2113   AMPHETMU NONE DETECTED 10/28/2007 2113   THCU POSITIVE* 10/28/2007 2113   LABBARB  Value: NONE DETECTED        DRUG SCREEN FOR MEDICAL PURPOSES ONLY.  IF CONFIRMATION IS NEEDED FOR ANY PURPOSE, NOTIFY LAB WITHIN 5 DAYS. 10/28/2007 2113    Alcohol Level: No results found for this basename: ETH:2 in the last 72 hours Urinalysis:  Misc. Labs:  ABGS  Basename 03/24/11 0430  PHART 7.321*  PO2ART 146.0*  TCO2 26.2  HCO3 27.3*   CULTURES Recent Results (from the past 240 hour(s))  CULTURE, BLOOD (ROUTINE X 2)     Status: Normal   Collection Time   03/20/11  9:35 PM      Component Value Range Status Comment   Specimen Description BLOOD LEFT  HAND   Final    Special Requests BOTTLES DRAWN AEROBIC AND ANAEROBIC 6CC   Final    Culture NO GROWTH 5 DAYS   Final    Report Status 03/25/2011 FINAL   Final   CULTURE, BLOOD (ROUTINE X 2)     Status: Normal   Collection Time   03/20/11  9:35 PM      Component Value Range Status Comment   Specimen Description RIGHT ANTECUBITAL   Final    Special Requests BOTTLES DRAWN AEROBIC AND ANAEROBIC [facility] EACH   Final    Culture NO GROWTH 5 DAYS   Final    Report Status 03/25/2011 FINAL   Final   URINE CULTURE     Status:  Normal   Collection Time   03/20/11 10:57 PM      Component Value Range Status Comment   Specimen Description URINE, CATHETERIZED   Final    Special Requests NONE   Final    Setup Time [id number]   Final    Colony Count NO GROWTH   Final    Culture NO GROWTH   Final    Report Status 03/22/2011 FINAL   Final   MRSA PCR SCREENING     Status: Normal   Collection Time   03/21/11  8:56 AM      Component Value Range Status Comment   MRSA by PCR NEGATIVE  NEGATIVE  Final    Studies/Results: No results found.  Medications:  Scheduled:   . antiseptic oral rinse  15 mL Mouth Rinse q12n4p  . citalopram  10 mg Oral Daily  . enoxaparin  40 mg Subcutaneous Daily  . fluticasone  2 spray Each Nare Daily  . Fluticasone-Salmeterol  1 puff Inhalation BID  . guaiFENesin  1,200 mg Oral BID  . insulin aspart  0-15 Units Subcutaneous TID WC  . insulin aspart  0-5 Units Subcutaneous QHS  . insulin glargine  40 Units Subcutaneous QHS  . levalbuterol  0.63 mg Nebulization Q6H  . lisinopril  5 mg Oral Daily  . metFORMIN  500 mg Oral BID WC  . moxifloxacin  400 mg Oral q1800  . multivitamins ther. w/minerals  1 tablet Oral Daily  . nicotine  14 mg Transdermal Daily  . pantoprazole  40 mg Oral BID AC  . predniSONE  40 mg Oral QAC breakfast  . sodium chloride      . vancomycin  1,250 mg Intravenous Q8H  . DISCONTD: chlorhexidine  15 mL Mouth Rinse BID  . DISCONTD: methylPREDNISolone (SOLU-MEDROL) injection  80 mg Intravenous Q8H  . DISCONTD: vancomycin  1,000 mg Intravenous Q8H   Continuous:   . DISCONTD: sodium chloride 10 mL/hr at 03/25/11 1900   LKG:[id number], acetaminophen, levalbuterol, LORazepam, morphine, ondansetron (ZOFRAN) IV, ondansetron, oxyCODONE  Assesment: As far as her respiratory problems she seems to be doing better. She still has some wheeze but she is able to breathe fairly well. She's complaining mostly of pain he does have a history of chronic back pain Principal  Problem:  *Respiratory failure with hypoxia Active Problems:  COPD with acute exacerbation  HTN (hypertension)  DM type 2 (diabetes mellitus, type 2)  Tobacco abuse  Bilateral pneumonia  Sinus tachycardia  Hyponatremia    Plan: She has received pain medications and we'll see how she does if she has significant pain at night and repeat her respiratory progress    LOS: 6 days   Breannah Kratt L 03/26/2011, 9:34 AM

## 2011-03-26 NOTE — Progress Notes (Signed)
Subjective: This lady feels significantly better today. She is sitting up in bed working on her laptop. She is smiling. She admits that her breathing is better. She is now complaining of low back pain which is chronic.          Physical Exam: Blood pressure 134/82, pulse 80, temperature 97.9 F (36.6 C), temperature source Oral, resp. rate 20, height 5\' 4"  (1.626 m), weight 135 kg (297 lb 9.9 oz), last menstrual period 02/27/2011, SpO2 96.00%. She is walking around on 4 L oxygen per minute. Lung fields show bilateral wheezing as before but not tight. Heart sounds are in sinus rhythm. She is alert and orientated.   Investigations:  Recent Results (from the past 240 hour(s))  CULTURE, BLOOD (ROUTINE X 2)     Status: Normal   Collection Time   03/20/11  9:35 PM      Component Value Range Status Comment   Specimen Description BLOOD LEFT HAND   Final    Special Requests BOTTLES DRAWN AEROBIC AND ANAEROBIC 6CC   Final    Culture NO GROWTH 5 DAYS   Final    Report Status 03/25/2011 FINAL   Final   CULTURE, BLOOD (ROUTINE X 2)     Status: Normal   Collection Time   03/20/11  9:35 PM      Component Value Range Status Comment   Specimen Description RIGHT ANTECUBITAL   Final    Special Requests BOTTLES DRAWN AEROBIC AND ANAEROBIC [facility] EACH   Final    Culture NO GROWTH 5 DAYS   Final    Report Status 03/25/2011 FINAL   Final   URINE CULTURE     Status: Normal   Collection Time   03/20/11 10:57 PM      Component Value Range Status Comment   Specimen Description URINE, CATHETERIZED   Final    Special Requests NONE   Final    Setup Time [id number]   Final    Colony Count NO GROWTH   Final    Culture NO GROWTH   Final    Report Status 03/22/2011 FINAL   Final   MRSA PCR SCREENING     Status: Normal   Collection Time   03/21/11  8:56 AM      Component Value Range Status Comment   MRSA by PCR NEGATIVE  NEGATIVE  Final      Basic Metabolic Panel:  Basename 03/26/11 0520 03/25/11  0644  NA 137 136  K 3.8 3.7  CL 99 98  CO2 35* 33*  GLUCOSE 232* 224*  BUN 13 11  CREATININE 0.57 0.53  CALCIUM 8.6 8.6  MG -- --  PHOS -- --   Liver Function Tests:  [facility] 03/26/11 0520  AST 24  ALT 66*  ALKPHOS 40  BILITOT 0.2*  PROT 6.2  ALBUMIN 2.7*     CBC:  Basename 03/26/11 0520 03/25/11 0644  WBC 19.9* 16.5*  NEUTROABS -- --  HGB 8.7* 8.1*  HCT 29.7* 29.1*  MCV 72.1* 72.4*  PLT 332 317        Medications: I have reviewed the patient's current medications.  Impression: 1. Exacerbation of COPD with respiratory failure, exacerbated by bilateral pneumonia. 2. Hypertension. 3. Type 2 diabetes mellitus, uncontrolled. 4. Ongoing tobacco abuse. 5. Obesity. 6.Depression.     Plan: 1. Discontinue intravenous steroids. Start oral steroids. This should improve her diabetes. 2. Repeat chest x-ray today. 3. Hopefully, if she continues to be as good as she is  today, she should be able to be discharged tomorrow.     LOS: 6 days   Dariusz Brase C 03/26/2011, 9:49 AM

## 2011-03-27 LAB — GLUCOSE, CAPILLARY
Glucose-Capillary: 54 mg/dL — ABNORMAL LOW (ref 70–99)
Glucose-Capillary: 78 mg/dL (ref 70–99)
Glucose-Capillary: 116 mg/dL — ABNORMAL HIGH (ref 70–99)

## 2011-03-27 MED ORDER — OXYCODONE HCL 5 MG PO TABS: 5.0000 mg | ORAL_TABLET | ORAL | Status: AC | PRN

## 2011-03-27 MED ORDER — LEVOFLOXACIN 750 MG PO TABS: 750.0000 mg | ORAL_TABLET | Freq: Every day | ORAL | Status: AC

## 2011-03-27 MED ORDER — FLUTICASONE-SALMETEROL 500-50 MCG/DOSE IN AEPB: 1.0000 | INHALATION_SPRAY | Freq: Two times a day (BID) | RESPIRATORY_TRACT | Status: DC

## 2011-03-27 MED ORDER — TRAZODONE HCL 50 MG PO TABS: 50.0000 mg | ORAL_TABLET | Freq: Every day | ORAL | Status: DC

## 2011-03-27 MED ORDER — PREDNISONE 20 MG PO TABS: ORAL_TABLET | ORAL | Status: DC

## 2011-03-27 MED ORDER — CITALOPRAM HYDROBROMIDE 10 MG PO TABS: 10.0000 mg | ORAL_TABLET | Freq: Every day | ORAL | Status: DC

## 2011-03-27 NOTE — Discharge Summary (Signed)
Physician Discharge Summary  Patient ID: [name] MRN: [id number] DOB/AGE: [birth year] 45 y.o. Primary Care Physician:ALLEN,DEBRA, NP, NP Admit date: 03/20/2011 Discharge date: 03/27/2011    Discharge Diagnoses:  1. Exacerbation of COPD with respiratory failure, improved significantly now. 2. Bilateral pneumonia, clinically improving. 3. Ongoing tobacco abuse. 4. Type 2 diabetes mellitus. 5. Hypertension. 6. Obesity. 7. Depression.   Current Discharge Medication List    START taking these medications   Details  citalopram (CELEXA) 10 MG tablet Take 1 tablet (10 mg total) by mouth daily. Qty: 30 tablet, Refills: 0    Fluticasone-Salmeterol (ADVAIR) 500-50 MCG/DOSE AEPB Inhale 1 puff into the lungs 2 (two) times daily. Qty: 60 each, Refills: 0    levofloxacin (LEVAQUIN) 750 MG tablet Take 1 tablet (750 mg total) by mouth daily. Qty: 7 tablet, Refills: 0    oxyCODONE (OXY IR/ROXICODONE) 5 MG immediate release tablet Take 1 tablet (5 mg total) by mouth every 4 (four) hours as needed. Qty: 30 tablet, Refills: 0    predniSONE (DELTASONE) 20 MG tablet Take 2 tablets daily for 4 days, then 1 tablet daily for 4 days, then half a tablet daily for 4 days, then STOP. Qty: 14 tablet, Refills: 0    traZODone (DESYREL) 50 MG tablet Take 1 tablet (50 mg total) by mouth at bedtime. Qty: 30 tablet, Refills: 0      CONTINUE these medications which have NOT CHANGED   Details  albuterol (PROVENTIL HFA) 108 (90 BASE) MCG/ACT inhaler Inhale 2 puffs into the lungs every 4 (four) hours as needed for wheezing or shortness of breath. Wheezing/shortnes of breath    glipiZIDE (GLUCOTROL) 10 MG tablet Take 10 mg by mouth daily.      lisinopril (PRINIVIL,ZESTRIL) 5 MG tablet Take 5 mg by mouth daily.      metFORMIN (GLUCOPHAGE) 500 MG tablet Take 500 mg by mouth 2 (two) times daily with a meal.     Pseudoeph-Doxylamine-DM-APAP (DAYQUIL/NYQUIL COLD/FLU RELIEF PO) Take 2 capsules by mouth  as needed. For cold and flu symptoms     fluticasone (FLONASE) 50 MCG/ACT nasal spray Place 2 sprays into the nose daily.    ibuprofen (ADVIL,MOTRIN) 200 MG tablet Take 200-400 mg by mouth daily as needed. For pain       STOP taking these medications     Fluticasone Propionate, Inhal, (FLOVENT DISKUS) 100 MCG/BLIST AEPB      Fluticasone-Salmeterol (ADVAIR) 250-50 MCG/DOSE AEPB         Discharged Condition: Improved and stable.    Consults: Pulmonology, Dr. Juanetta Gosling.  Significant Diagnostic Studies: Dg Chest 2 View  03/26/2011  *RADIOLOGY REPORT*  Clinical Data: Pneumonia.  CHEST - 2 VIEW  Comparison: 03/23/2011  Findings: Heart is borderline in size.  There is diffuse peribronchial thickening and interstitial prominence.  Patchy opacities in the right lower lobe and left upper lobe have improved slightly.  Patchy densities in the left base stable or slightly worsened.  IMPRESSION: The patchy bilateral infiltrates, some which have improved. Possible slight increase in left lower lobe opacity.  Original Report Authenticated By: Cyndie Chime, M.D.   Dg Chest 2 View  03/22/2011  *RADIOLOGY REPORT*  Clinical Data: Low back pain.  Follow-up pneumonia.  CHEST - 2 VIEW  Comparison: 03/20/2011  Findings: Chronic peribronchial thickening.  Focal opacity in the right middle lobe could represent pneumonia.  Left lung is clear. Heart is normal size.  No effusions.  IMPRESSION: Chronic bronchitic changes.  Probable right middle lobe pneumonia.  Original Report Authenticated By: Cyndie Chime, M.D.   Dg Lumbar Spine 2-3 Views  03/22/2011  *RADIOLOGY REPORT*  Clinical Data: Low back pain,  LUMBAR SPINE - 2-3 VIEW  Comparison: CT 09/01/2010  Findings: There is normal alignment of the lumbar vertebral bodies. There is mild endplate spurring at L1-L2.  No subluxation.  No loss of vertebral body height or disc height.  IMPRESSION: Mild disc osteophytic disease.  Original Report Authenticated By:  Genevive Bi, M.D.   Dg Chest Port 1 View  03/23/2011  *RADIOLOGY REPORT*  Clinical Data: Pneumonia  PORTABLE CHEST - 1 VIEW  Comparison: 03/22/2011  Findings: Progression of bilateral airspace disease, most prominent in the right base.  Findings are most compatible with pneumonia. Edema is less likely.  No pleural effusion.  IMPRESSION: Progression of bilateral infiltrates.  Original Report Authenticated By: Camelia Phenes, M.D.   Dg Chest Portable 1 View  03/20/2011  *RADIOLOGY REPORT*  Clinical Data: Cough, congestion, shortness of breath and fever; history of smoking.  PORTABLE CHEST - 1 VIEW  Comparison: Chest radiograph performed 02/02/2011  Findings: The lungs are well-aerated.  Mild left basilar and right midlung airspace opacities raise concern for multifocal pneumonia. Underlying vascular congestion is noted; the appearance is atypical for interstitial edema.  No definite pleural effusion or pneumothorax is seen.  The cardiomediastinal silhouette remains normal in size.  No acute osseous abnormalities are identified.  IMPRESSION:  1.  Mild left basilar and right lung airspace opacities raise concern for multifocal pneumonia. 2.  Underlying vascular congestion noted.  Original Report Authenticated By: Tonia Ghent, M.D.    Lab Results: Basic Metabolic Panel:  Basename 03/26/11 0520 03/25/11 0644  NA 137 136  K 3.8 3.7  CL 99 98  CO2 35* 33*  GLUCOSE 232* 224*  BUN 13 11  CREATININE 0.57 0.53  CALCIUM 8.6 8.6  MG -- --  PHOS -- --   Liver Function Tests:  [facility] 03/26/11 0520  AST 24  ALT 66*  ALKPHOS 40  BILITOT 0.2*  PROT 6.2  ALBUMIN 2.7*     CBC:  Basename 03/26/11 0520 03/25/11 0644  WBC 19.9* 16.5*  NEUTROABS -- --  HGB 8.7* 8.1*  HCT 29.7* 29.1*  MCV 72.1* 72.4*  PLT 332 317    Recent Results (from the past 240 hour(s))  CULTURE, BLOOD (ROUTINE X 2)     Status: Normal   Collection Time   03/20/11  9:35 PM      Component Value Range Status  Comment   Specimen Description BLOOD LEFT HAND   Final    Special Requests BOTTLES DRAWN AEROBIC AND ANAEROBIC 6CC   Final    Culture NO GROWTH 5 DAYS   Final    Report Status 03/25/2011 FINAL   Final   CULTURE, BLOOD (ROUTINE X 2)     Status: Normal   Collection Time   03/20/11  9:35 PM      Component Value Range Status Comment   Specimen Description RIGHT ANTECUBITAL   Final    Special Requests BOTTLES DRAWN AEROBIC AND ANAEROBIC [facility] EACH   Final    Culture NO GROWTH 5 DAYS   Final    Report Status 03/25/2011 FINAL   Final   URINE CULTURE     Status: Normal   Collection Time   03/20/11 10:57 PM      Component Value Range Status Comment   Specimen Description URINE, CATHETERIZED   Final    Special Requests  NONE   Final    Setup Time [id number]   Final    Colony Count NO GROWTH   Final    Culture NO GROWTH   Final    Report Status 03/22/2011 FINAL   Final   MRSA PCR SCREENING     Status: Normal   Collection Time   03/21/11  8:56 AM      Component Value Range Status Comment   MRSA by PCR NEGATIVE  NEGATIVE  Final      Hospital Course: This 45 year old lady, who is obese, presented to the hospital with symptoms of dyspnea for 3 days. She also had a cough productive of yellowish sputum. It was felt that she had exacerbation of COPD. The chest x-ray showed presence of bilateral pneumonia. On admission, she was in respiratory failure and she required BiPAP. Unfortunately, she did not require mechanical ventilation. Also fortunately, she was able to come off the BiPAP in the next couple of days. She is now reached her baseline oxygen requirement at home which is 3-4 L per minute. She continues to cough up purulent sputum. Her wheezing is improved but is still present. I think this is representing her chronic baseline state. She has responded well to initially intravenous steroids and antibiotics and now she is on oral steroids and antibiotics. Influenza test was negative and urinary  Legionella antigen was also negative. Although she is still coughing and wheezing today she is saturating adequately on 4 L of oxygen per minute. She is mobilizing reasonably well. She has had relative hypoglycemic event this morning with a blood glucose of 54 but now this is resolved. This was because she was started on Lantus insulin in view of her hyperglycemia with steroid treatment.   Discharge Exam: Blood pressure 155/84, pulse 89, temperature 98.7 F (37.1 C), temperature source Oral, resp. rate 22, height 5\' 4"  (1.626 m), weight 135 kg (297 lb 9.9 oz), last menstrual period 02/27/2011, SpO2 95.00%. She looks much improved than when she was admitted. There is no increased work of breathing. There is no peripheral central cyanosis. Lung fields show bilateral wheezing with a few crackles scattered. There is no bronchial breathing. She is alert and orientated. Heart sounds are present and normal without murmurs.  Disposition: Home. She will still need a tapering dose of steroids over the next 12 days. She will also need one further week of oral antibiotics with Levaquin. She will clearly need close followup by her primary care physicians at the health department. Also she will need monitoring of her depression. She has been started on Celexa for this while she was an inpatient.  Discharge Orders    Future Orders Please Complete By Expires   Diet - low sodium heart healthy      Increase activity slowly      Discharge instructions      Comments:   Please followup with your physician at the health department in the next 1-2 weeks. You  will need a repeat chest x-ray in the next 4-6 weeks to make sure the pneumonia has cleared.      Follow-up Information    Follow up with [facility]. Make an appointment in 1 week.   Contact information:   [address] [place] [address] [phone]          Signed: Wilson Singer 03/27/2011, 10:36 AM

## 2011-03-27 NOTE — Progress Notes (Signed)
CBG:54   Treatment: 15 GM carbohydrate snack  Symptoms: Pale, Shaky and Vision changes  Follow-up CBG: Time:0800 CBG Result:78  Possible Reasons for Event: Unknown  Comments/MD notified: dr. Juanetta Gosling aware    [name] Still

## 2011-03-27 NOTE — Progress Notes (Signed)
Pt discharged home via family; Pt and family given and explained all discharge instructions, carenotes, and prescriptions; pt and family stated understanding and denied questions/concerns; all f/u appointments in place; IV removed without complicaitons; pt stable at time of discharge  

## 2011-03-27 NOTE — Progress Notes (Signed)
Visit for support-emotional and spiritual.  Patient was very expensive about her concerns about going home and coping with her own care, along with the positive health changes she stated she wanted to make--stop smoking, becoming more active and more engaged. We discussed self-acceptance and making changes realistically.  Discussed faith issues.  Brought her support materials. Prayed.

## 2011-03-27 NOTE — Progress Notes (Signed)
Subjective: She's complaining of low blood sugar this morning and her sugar is in the 50s. This has made her nauseated as well. She continues to have low back pain.  Objective: Vital signs in last 24 hours: Temp:  [98.1 F (36.7 C)-98.7 F (37.1 C)] 98.7 F (37.1 C) (12/03 0650) Pulse Rate:  [63-89] 89  (12/03 0650) Resp:  [19-22] 22  (12/03 0650) BP: (127-155)/(72-84) 155/84 mmHg (12/03 0650) SpO2:  [94 %-98 %] 95 % (12/03 0719) Weight change:  Last BM Date: 03/25/11  Intake/Output from previous day: 12/02 0701 - 12/03 0700 In: 800 [P.O.:300; IV Piggyback:500] Out: -   PHYSICAL EXAM General appearance: alert and mild distress Resp: rhonchi bilaterally Cardio: regular rate and rhythm, S1, S2 normal, no murmur, click, rub or gallop GI: soft, non-tender; bowel sounds normal; no masses,  no organomegaly Extremities: extremities normal, atraumatic, no cyanosis or edema  Lab Results:    Basic Metabolic Panel:  Basename 03/26/11 0520 03/25/11 0644  NA 137 136  K 3.8 3.7  CL 99 98  CO2 35* 33*  GLUCOSE 232* 224*  BUN 13 11  CREATININE 0.57 0.53  CALCIUM 8.6 8.6  MG -- --  PHOS -- --   Liver Function Tests:  Basename 03/26/11 0520  AST 24  ALT 66*  ALKPHOS 40  BILITOT 0.2*  PROT 6.2  ALBUMIN 2.7*   No results found for this basename: LIPASE:2,AMYLASE:2 in the last 72 hours No results found for this basename: AMMONIA:2 in the last 72 hours CBC:  Basename 03/26/11 0520 03/25/11 0644  WBC 19.9* 16.5*  NEUTROABS -- --  HGB 8.7* 8.1*  HCT 29.7* 29.1*  MCV 72.1* 72.4*  PLT 332 317   Cardiac Enzymes: No results found for this basename: CKTOTAL:3,CKMB:3,CKMBINDEX:3,TROPONINI:3 in the last 72 hours BNP: No results found for this basename: POCBNP:3 in the last 72 hours D-Dimer: No results found for this basename: DDIMER:2 in the last 72 hours CBG:  Basename 03/27/11 0730 03/26/11 2049 03/26/11 1640 03/26/11 1117 03/26/11 0730 03/25/11 2113  GLUCAP 54* 195*  227* 288* 212* 276*   Hemoglobin A1C: No results found for this basename: HGBA1C in the last 72 hours Fasting Lipid Panel: No results found for this basename: CHOL,HDL,LDLCALC,TRIG,CHOLHDL,LDLDIRECT in the last 72 hours Thyroid Function Tests: No results found for this basename: TSH,T4TOTAL,FREET4,T3FREE,THYROIDAB in the last 72 hours Anemia Panel: No results found for this basename: VITAMINB12,FOLATE,FERRITIN,TIBC,IRON,RETICCTPCT in the last 72 hours Coagulation: No results found for this basename: LABPROT:2,INR:2 in the last 72 hours Urine Drug Screen: Drugs of Abuse     Component Value Date/Time   LABOPIA POSITIVE* 10/28/2007 2113   COCAINSCRNUR NONE DETECTED 10/28/2007 2113   LABBENZ NONE DETECTED 10/28/2007 2113   AMPHETMU NONE DETECTED 10/28/2007 2113   THCU POSITIVE* 10/28/2007 2113   LABBARB  Value: NONE DETECTED        DRUG SCREEN FOR MEDICAL PURPOSES ONLY.  IF CONFIRMATION IS NEEDED FOR ANY PURPOSE, NOTIFY LAB WITHIN 5 DAYS. 10/28/2007 2113    Alcohol Level: No results found for this basename: ETH:2 in the last 72 hours Urinalysis:  Misc. Labs:  ABGS No results found for this basename: PHART,PCO2,PO2ART,TCO2,HCO3 in the last 72 hours CULTURES Recent Results (from the past 240 hour(s))  CULTURE, BLOOD (ROUTINE X 2)     Status: Normal   Collection Time   03/20/11  9:35 PM      Component Value Range Status Comment   Specimen Description BLOOD LEFT HAND   Final  Special Requests BOTTLES DRAWN AEROBIC AND ANAEROBIC 6CC   Final    Culture NO GROWTH 5 DAYS   Final    Report Status 03/25/2011 FINAL   Final   CULTURE, BLOOD (ROUTINE X 2)     Status: Normal   Collection Time   03/20/11  9:35 PM      Component Value Range Status Comment   Specimen Description RIGHT ANTECUBITAL   Final    Special Requests BOTTLES DRAWN AEROBIC AND ANAEROBIC [facility] EACH   Final    Culture NO GROWTH 5 DAYS   Final    Report Status 03/25/2011 FINAL   Final   URINE CULTURE     Status: Normal    Collection Time   03/20/11 10:57 PM      Component Value Range Status Comment   Specimen Description URINE, CATHETERIZED   Final    Special Requests NONE   Final    Setup Time [id number]   Final    Colony Count NO GROWTH   Final    Culture NO GROWTH   Final    Report Status 03/22/2011 FINAL   Final   MRSA PCR SCREENING     Status: Normal   Collection Time   03/21/11  8:56 AM      Component Value Range Status Comment   MRSA by PCR NEGATIVE  NEGATIVE  Final    Studies/Results: Dg Chest 2 View  03/26/2011  *RADIOLOGY REPORT*  Clinical Data: Pneumonia.  CHEST - 2 VIEW  Comparison: 03/23/2011  Findings: Heart is borderline in size.  There is diffuse peribronchial thickening and interstitial prominence.  Patchy opacities in the right lower lobe and left upper lobe have improved slightly.  Patchy densities in the left base stable or slightly worsened.  IMPRESSION: The patchy bilateral infiltrates, some which have improved. Possible slight increase in left lower lobe opacity.  Original Report Authenticated By: Cyndie Chime, M.D.    Medications:  Scheduled:   . antiseptic oral rinse  15 mL Mouth Rinse q12n4p  . citalopram  10 mg Oral Daily  . enoxaparin  40 mg Subcutaneous Daily  . fluticasone  2 spray Each Nare Daily  . Fluticasone-Salmeterol  1 puff Inhalation BID  . guaiFENesin  1,200 mg Oral BID  . insulin aspart  0-15 Units Subcutaneous TID WC  . insulin aspart  0-5 Units Subcutaneous QHS  . insulin glargine  40 Units Subcutaneous QHS  . levalbuterol  0.63 mg Nebulization Q6H  . lisinopril  5 mg Oral Daily  . metFORMIN  500 mg Oral BID WC  . moxifloxacin  400 mg Oral q1800  . multivitamins ther. w/minerals  1 tablet Oral Daily  . nicotine  14 mg Transdermal Daily  . pantoprazole  40 mg Oral BID AC  . predniSONE  40 mg Oral QAC breakfast  . sodium chloride      . sodium chloride      . sodium chloride      . vancomycin  1,250 mg Intravenous Q8H  . DISCONTD:  methylPREDNISolone (SOLU-MEDROL) injection  80 mg Intravenous Q8H   Continuous:   . DISCONTD: sodium chloride 10 mL/hr at 03/25/11 1900   WJX:BJYNWGNFAOZHY, acetaminophen, levalbuterol, LORazepam, morphine, ondansetron (ZOFRAN) IV, ondansetron, oxyCODONE  Assesment: She has hypoglycemia. This is causing her to be nauseated. She has COPD and had respiratory failure which is markedly improved. She has chronic low back pain which I think is pretty much unchanged. Principal Problem:  *Respiratory failure with hypoxia  Active Problems:  COPD with acute exacerbation  HTN (hypertension)  DM type 2 (diabetes mellitus, type 2)  Tobacco abuse  Bilateral pneumonia  Sinus tachycardia  Hyponatremia    Plan: No change in treatments will see how she does after she gets her blood sugar up    LOS: 7 days   [name] 03/27/2011, 8:17 AM

## 2011-05-03 ENCOUNTER — Inpatient Hospital Stay (HOSPITAL_COMMUNITY)
Admission: EM | Admit: 2011-05-03 | Discharge: 2011-05-08 | DRG: 194 | Disposition: A | Discharge status: Home / Self Care | Payer: Medicaid Other | Attending: Internal Medicine | Admitting: Internal Medicine

## 2011-05-03 ENCOUNTER — Emergency Department (HOSPITAL_COMMUNITY): Payer: Medicaid Other

## 2011-05-03 ENCOUNTER — Encounter (HOSPITAL_COMMUNITY): Payer: Self-pay

## 2011-05-03 DIAGNOSIS — E1142 Type 2 diabetes mellitus with diabetic polyneuropathy: Secondary | ICD-10-CM | POA: Diagnosis present

## 2011-05-03 DIAGNOSIS — E119 Type 2 diabetes mellitus without complications: Secondary | ICD-10-CM

## 2011-05-03 DIAGNOSIS — Z6841 Body Mass Index (BMI) 40.0 and over, adult: Secondary | ICD-10-CM

## 2011-05-03 DIAGNOSIS — J449 Chronic obstructive pulmonary disease, unspecified: Secondary | ICD-10-CM

## 2011-05-03 DIAGNOSIS — R000 Tachycardia, unspecified: Secondary | ICD-10-CM

## 2011-05-03 DIAGNOSIS — F17200 Nicotine dependence, unspecified, uncomplicated: Secondary | ICD-10-CM | POA: Diagnosis present

## 2011-05-03 DIAGNOSIS — K259 Gastric ulcer, unspecified as acute or chronic, without hemorrhage or perforation: Secondary | ICD-10-CM

## 2011-05-03 DIAGNOSIS — J019 Acute sinusitis, unspecified: Secondary | ICD-10-CM | POA: Diagnosis present

## 2011-05-03 DIAGNOSIS — E1159 Type 2 diabetes mellitus with other circulatory complications: Secondary | ICD-10-CM | POA: Diagnosis present

## 2011-05-03 DIAGNOSIS — J441 Chronic obstructive pulmonary disease with (acute) exacerbation: Secondary | ICD-10-CM | POA: Diagnosis present

## 2011-05-03 DIAGNOSIS — I10 Essential (primary) hypertension: Secondary | ICD-10-CM

## 2011-05-03 DIAGNOSIS — R1310 Dysphagia, unspecified: Secondary | ICD-10-CM

## 2011-05-03 DIAGNOSIS — D72829 Elevated white blood cell count, unspecified: Secondary | ICD-10-CM

## 2011-05-03 DIAGNOSIS — F329 Major depressive disorder, single episode, unspecified: Secondary | ICD-10-CM | POA: Diagnosis present

## 2011-05-03 DIAGNOSIS — D509 Iron deficiency anemia, unspecified: Secondary | ICD-10-CM

## 2011-05-03 DIAGNOSIS — J9691 Respiratory failure, unspecified with hypoxia: Secondary | ICD-10-CM

## 2011-05-03 DIAGNOSIS — R1319 Other dysphagia: Secondary | ICD-10-CM

## 2011-05-03 DIAGNOSIS — E876 Hypokalemia: Secondary | ICD-10-CM

## 2011-05-03 DIAGNOSIS — R1084 Generalized abdominal pain: Secondary | ICD-10-CM

## 2011-05-03 DIAGNOSIS — R0902 Hypoxemia: Secondary | ICD-10-CM

## 2011-05-03 DIAGNOSIS — F3289 Other specified depressive episodes: Secondary | ICD-10-CM | POA: Diagnosis present

## 2011-05-03 DIAGNOSIS — K2960 Other gastritis without bleeding: Secondary | ICD-10-CM | POA: Diagnosis present

## 2011-05-03 DIAGNOSIS — R197 Diarrhea, unspecified: Secondary | ICD-10-CM

## 2011-05-03 DIAGNOSIS — F32A Depression, unspecified: Secondary | ICD-10-CM

## 2011-05-03 DIAGNOSIS — F17210 Nicotine dependence, cigarettes, uncomplicated: Secondary | ICD-10-CM | POA: Diagnosis present

## 2011-05-03 DIAGNOSIS — Z720 Tobacco use: Secondary | ICD-10-CM

## 2011-05-03 DIAGNOSIS — J189 Pneumonia, unspecified organism: Secondary | ICD-10-CM

## 2011-05-03 DIAGNOSIS — E871 Hypo-osmolality and hyponatremia: Secondary | ICD-10-CM

## 2011-05-03 DIAGNOSIS — K219 Gastro-esophageal reflux disease without esophagitis: Secondary | ICD-10-CM

## 2011-05-03 HISTORY — DX: Chronic respiratory failure with hypoxia: J96.11

## 2011-05-03 LAB — CULTURE, BLOOD (ROUTINE X 2)
Culture: NO GROWTH
Culture: NO GROWTH

## 2011-05-03 LAB — DIFFERENTIAL
Basophils Absolute: 0 10*3/uL (ref 0.0–0.1)
Basophils Relative: 0 % (ref 0–1)
Eosinophils Absolute: 0 10*3/uL (ref 0.0–0.7)
Eosinophils Relative: 0 % (ref 0–5)
Lymphocytes Relative: 16 % (ref 12–46)
Lymphs Abs: 1.5 10*3/uL (ref 0.7–4.0)
Monocytes Absolute: 1 10*3/uL (ref 0.1–1.0)
Monocytes Relative: 11 % (ref 3–12)
Neutro Abs: 6.8 10*3/uL (ref 1.7–7.7)
Neutrophils Relative %: 73 % (ref 43–77)

## 2011-05-03 LAB — BASIC METABOLIC PANEL
BUN: 8 mg/dL (ref 6–23)
CO2: 28 mEq/L (ref 19–32)
Calcium: 9.4 mg/dL (ref 8.4–10.5)
Chloride: 94 mEq/L — ABNORMAL LOW (ref 96–112)
Creatinine, Ser: 0.78 mg/dL (ref 0.50–1.10)
GFR calc Af Amer: >90 mL/min (ref >90)
GFR calc non Af Amer: >90 mL/min (ref >90)
Glucose, Bld: 153 mg/dL — ABNORMAL HIGH (ref 70–99)
Potassium: 3.6 mEq/L (ref 3.5–5.1)
Sodium: 131 mEq/L — ABNORMAL LOW (ref 135–145)

## 2011-05-03 LAB — BLOOD GAS, ARTERIAL
Acid-Base Excess: 1.7 mmol/L (ref 0.0–2.0)
Bicarbonate: 25.9 mEq/L — ABNORMAL HIGH (ref 20.0–24.0)
Drawn by: 22874
O2 Content: 2 L/min
O2 Saturation: 93.2 %
Patient temperature: 37
TCO2: 24 mmol/L (ref 0–100)
pCO2 arterial: 42.4 mmHg (ref 35.0–45.0)
pH, Arterial: 7.404 — ABNORMAL HIGH (ref 7.350–7.400)
pO2, Arterial: 68.3 mmHg — ABNORMAL LOW (ref 80.0–100.0)

## 2011-05-03 LAB — GLUCOSE, CAPILLARY
Glucose-Capillary: 191 mg/dL — ABNORMAL HIGH (ref 70–99)
Glucose-Capillary: 219 mg/dL — ABNORMAL HIGH (ref 70–99)

## 2011-05-03 LAB — MRSA PCR SCREENING: MRSA by PCR: NEGATIVE

## 2011-05-03 LAB — CBC
HCT: 31.6 % — ABNORMAL LOW (ref 36.0–46.0)
Hemoglobin: 9 g/dL — ABNORMAL LOW (ref 12.0–15.0)
MCH: 19.7 pg — ABNORMAL LOW (ref 26.0–34.0)
MCHC: 28.5 g/dL — ABNORMAL LOW (ref 30.0–36.0)
MCV: 69.1 fL — ABNORMAL LOW (ref 78.0–100.0)
Platelets: 284 10*3/uL (ref 150–400)
RBC: 4.57 MIL/uL (ref 3.87–5.11)
RDW: 18.9 % — ABNORMAL HIGH (ref 11.5–15.5)
WBC: 9.3 10*3/uL (ref 4.0–10.5)

## 2011-05-03 MED ORDER — OSELTAMIVIR PHOSPHATE 75 MG PO CAPS
75.0000 mg | ORAL_CAPSULE | Freq: Two times a day (BID) | ORAL | Status: AC
  Administered 2011-05-03 – 2011-05-08 (×10): 75 mg via ORAL
  Filled 2011-05-03 (×10): qty 1

## 2011-05-03 MED ORDER — CEFTAROLINE FOSAMIL 600 MG IV SOLR
INTRAVENOUS | Status: AC
  Filled 2011-05-03: qty 600

## 2011-05-03 MED ORDER — IPRATROPIUM BROMIDE 0.02 % IN SOLN
0.5000 mg | Freq: Once | RESPIRATORY_TRACT | Status: AC
  Administered 2011-05-03: 0.5 mg via RESPIRATORY_TRACT
  Filled 2011-05-03: qty 2.5

## 2011-05-03 MED ORDER — NICOTINE 21 MG/24HR TD PT24
MEDICATED_PATCH | TRANSDERMAL | Status: AC
  Filled 2011-05-03: qty 1

## 2011-05-03 MED ORDER — FAMOTIDINE 20 MG PO TABS
20.0000 mg | ORAL_TABLET | Freq: Two times a day (BID) | ORAL | Status: DC
  Administered 2011-05-03 – 2011-05-06 (×6): 20 mg via ORAL
  Filled 2011-05-03 (×5): qty 1

## 2011-05-03 MED ORDER — FLUTICASONE PROPIONATE 50 MCG/ACT NA SUSP
2.0000 | Freq: Every day | NASAL | Status: DC
  Administered 2011-05-04 – 2011-05-08 (×5): 2 via NASAL
  Filled 2011-05-03: qty 16

## 2011-05-03 MED ORDER — ACETAMINOPHEN 325 MG PO TABS
650.0000 mg | ORAL_TABLET | Freq: Four times a day (QID) | ORAL | Status: DC | PRN
  Administered 2011-05-04: 650 mg via ORAL
  Filled 2011-05-03: qty 2

## 2011-05-03 MED ORDER — HYDROMORPHONE HCL PF 1 MG/ML IJ SOLN
0.5000 mg | Freq: Once | INTRAMUSCULAR | Status: AC
  Administered 2011-05-03: 0.5 mg via INTRAVENOUS
  Filled 2011-05-03: qty 1

## 2011-05-03 MED ORDER — METHYLPREDNISOLONE SODIUM SUCC 125 MG IJ SOLR
125.0000 mg | Freq: Once | INTRAMUSCULAR | Status: AC
  Administered 2011-05-03: 125 mg via INTRAVENOUS
  Filled 2011-05-03: qty 2

## 2011-05-03 MED ORDER — NICOTINE 21 MG/24HR TD PT24
21.0000 mg | MEDICATED_PATCH | Freq: Every day | TRANSDERMAL | Status: DC
  Administered 2011-05-04 – 2011-05-08 (×5): 21 mg via TRANSDERMAL
  Filled 2011-05-03 (×6): qty 1

## 2011-05-03 MED ORDER — ONDANSETRON HCL 4 MG PO TABS: 4.0000 mg | ORAL_TABLET | Freq: Four times a day (QID) | ORAL | Status: DC | PRN

## 2011-05-03 MED ORDER — ALBUTEROL SULFATE (5 MG/ML) 0.5% IN NEBU
5.0000 mg | INHALATION_SOLUTION | Freq: Once | RESPIRATORY_TRACT | Status: AC
  Administered 2011-05-03: 5 mg via RESPIRATORY_TRACT
  Filled 2011-05-03: qty 1

## 2011-05-03 MED ORDER — LISINOPRIL 10 MG PO TABS
5.0000 mg | ORAL_TABLET | Freq: Every day | ORAL | Status: DC
  Administered 2011-05-04: 10:00:00 via ORAL
  Administered 2011-05-05 – 2011-05-08 (×4): 5 mg via ORAL
  Filled 2011-05-03 (×7): qty 1

## 2011-05-03 MED ORDER — SODIUM CHLORIDE 0.9 % IV SOLN: Freq: Once | INTRAVENOUS | Status: DC

## 2011-05-03 MED ORDER — BENZONATATE 100 MG PO CAPS
100.0000 mg | ORAL_CAPSULE | Freq: Three times a day (TID) | ORAL | Status: DC
  Administered 2011-05-03 – 2011-05-08 (×15): 100 mg via ORAL
  Filled 2011-05-03 (×15): qty 1

## 2011-05-03 MED ORDER — SODIUM CHLORIDE 0.9 % IV SOLN
Freq: Once | INTRAVENOUS | Status: AC
  Administered 2011-05-03: 250 mL via INTRAVENOUS

## 2011-05-03 MED ORDER — TRAZODONE HCL 50 MG PO TABS
25.0000 mg | ORAL_TABLET | Freq: Every evening | ORAL | Status: DC | PRN
  Filled 2011-05-03: qty 1

## 2011-05-03 MED ORDER — SODIUM CHLORIDE 0.9 % IV SOLN
600.0000 mg | Freq: Two times a day (BID) | INTRAVENOUS | Status: DC
  Administered 2011-05-03 – 2011-05-08 (×10): 600 mg via INTRAVENOUS
  Filled 2011-05-03 (×12): qty 600

## 2011-05-03 MED ORDER — ENOXAPARIN SODIUM 40 MG/0.4ML ~~LOC~~ SOLN
40.0000 mg | SUBCUTANEOUS | Status: DC
  Administered 2011-05-03 – 2011-05-07 (×5): 40 mg via SUBCUTANEOUS
  Filled 2011-05-03 (×5): qty 0.4

## 2011-05-03 MED ORDER — CITALOPRAM HYDROBROMIDE 20 MG PO TABS
ORAL_TABLET | ORAL | Status: AC
  Filled 2011-05-03: qty 1

## 2011-05-03 MED ORDER — METFORMIN HCL 500 MG PO TABS
500.0000 mg | ORAL_TABLET | Freq: Two times a day (BID) | ORAL | Status: DC
  Administered 2011-05-03 – 2011-05-08 (×6): 500 mg via ORAL
  Filled 2011-05-03 (×6): qty 1

## 2011-05-03 MED ORDER — CITALOPRAM HYDROBROMIDE 10 MG PO TABS
10.0000 mg | ORAL_TABLET | Freq: Every day | ORAL | Status: DC
  Administered 2011-05-04: 10 mg via ORAL
  Filled 2011-05-03 (×3): qty 1

## 2011-05-03 MED ORDER — ALUM & MAG HYDROXIDE-SIMETH 200-200-20 MG/5ML PO SUSP: 30.0000 mL | Freq: Four times a day (QID) | ORAL | Status: DC | PRN

## 2011-05-03 MED ORDER — PIPERACILLIN-TAZOBACTAM 3.375 G IVPB
3.3750 g | Freq: Once | INTRAVENOUS | Status: AC
  Administered 2011-05-03: 3.375 g via INTRAVENOUS
  Filled 2011-05-03: qty 50

## 2011-05-03 MED ORDER — LISINOPRIL 10 MG PO TABS
ORAL_TABLET | ORAL | Status: AC
  Filled 2011-05-03: qty 1

## 2011-05-03 MED ORDER — ALPRAZOLAM 0.5 MG PO TABS
0.5000 mg | ORAL_TABLET | Freq: Three times a day (TID) | ORAL | Status: DC | PRN
  Administered 2011-05-03 – 2011-05-08 (×11): 0.5 mg via ORAL
  Filled 2011-05-03 (×12): qty 1

## 2011-05-03 MED ORDER — ONDANSETRON HCL 4 MG/2ML IJ SOLN: 4.0000 mg | Freq: Four times a day (QID) | INTRAMUSCULAR | Status: DC | PRN

## 2011-05-03 MED ORDER — HYDROCODONE-ACETAMINOPHEN 5-325 MG PO TABS
1.0000 | ORAL_TABLET | ORAL | Status: DC | PRN
  Administered 2011-05-03: 2 via ORAL
  Administered 2011-05-04: 1 via ORAL
  Administered 2011-05-04 (×3): 2 via ORAL
  Administered 2011-05-05: 1 via ORAL
  Filled 2011-05-03 (×2): qty 2
  Filled 2011-05-03 (×2): qty 1
  Filled 2011-05-03 (×2): qty 2

## 2011-05-03 MED ORDER — HYDROCOD POLST-CHLORPHEN POLST 10-8 MG/5ML PO LQCR
5.0000 mL | Freq: Two times a day (BID) | ORAL | Status: DC | PRN
  Administered 2011-05-06 – 2011-05-08 (×4): 5 mL via ORAL
  Filled 2011-05-03 (×4): qty 5

## 2011-05-03 MED ORDER — ALBUTEROL SULFATE (5 MG/ML) 0.5% IN NEBU
2.5000 mg | INHALATION_SOLUTION | RESPIRATORY_TRACT | Status: DC
  Administered 2011-05-03 – 2011-05-07 (×21): 2.5 mg via RESPIRATORY_TRACT
  Filled 2011-05-03 (×21): qty 0.5

## 2011-05-03 MED ORDER — INSULIN ASPART 100 UNIT/ML ~~LOC~~ SOLN
0.0000 [IU] | Freq: Three times a day (TID) | SUBCUTANEOUS | Status: DC
  Administered 2011-05-03: 4 [IU] via SUBCUTANEOUS
  Administered 2011-05-04 (×2): 11 [IU] via SUBCUTANEOUS
  Administered 2011-05-04: 4 [IU] via SUBCUTANEOUS
  Administered 2011-05-05: 7 [IU] via SUBCUTANEOUS
  Administered 2011-05-05: 4 [IU] via SUBCUTANEOUS
  Administered 2011-05-05: 7 [IU] via SUBCUTANEOUS
  Administered 2011-05-06: 15 [IU] via SUBCUTANEOUS
  Administered 2011-05-06: 7 [IU] via SUBCUTANEOUS
  Administered 2011-05-06: 4 [IU] via SUBCUTANEOUS
  Administered 2011-05-07: 7 [IU] via SUBCUTANEOUS
  Administered 2011-05-07: 11 [IU] via SUBCUTANEOUS
  Administered 2011-05-07: 15 [IU] via SUBCUTANEOUS
  Administered 2011-05-08: 7 [IU] via SUBCUTANEOUS
  Administered 2011-05-08: 4 [IU] via SUBCUTANEOUS
  Filled 2011-05-03: qty 3

## 2011-05-03 MED ORDER — ACETAMINOPHEN 650 MG RE SUPP: 650.0000 mg | Freq: Four times a day (QID) | RECTAL | Status: DC | PRN

## 2011-05-03 MED ORDER — VANCOMYCIN HCL IN DEXTROSE 1-5 GM/200ML-% IV SOLN
1000.0000 mg | Freq: Once | INTRAVENOUS | Status: AC
  Administered 2011-05-03: 1000 mg via INTRAVENOUS
  Filled 2011-05-03: qty 200

## 2011-05-03 MED ORDER — ALBUTEROL (5 MG/ML) CONTINUOUS INHALATION SOLN
10.0000 mg/h | INHALATION_SOLUTION | Freq: Once | RESPIRATORY_TRACT | Status: AC
  Administered 2011-05-03: 10 mg/h via RESPIRATORY_TRACT
  Filled 2011-05-03: qty 20

## 2011-05-03 MED ORDER — INSULIN ASPART 100 UNIT/ML ~~LOC~~ SOLN
0.0000 [IU] | Freq: Every day | SUBCUTANEOUS | Status: DC
  Administered 2011-05-03: 2 [IU] via SUBCUTANEOUS
  Administered 2011-05-04: 4 [IU] via SUBCUTANEOUS
  Administered 2011-05-05: 2 [IU] via SUBCUTANEOUS
  Administered 2011-05-06: 5 [IU] via SUBCUTANEOUS
  Administered 2011-05-07: 3 [IU] via SUBCUTANEOUS

## 2011-05-03 MED ORDER — GUAIFENESIN ER 600 MG PO TB12
600.0000 mg | ORAL_TABLET | Freq: Two times a day (BID) | ORAL | Status: DC
  Administered 2011-05-03 – 2011-05-08 (×9): 600 mg via ORAL
  Filled 2011-05-03 (×8): qty 1

## 2011-05-03 MED ORDER — METHYLPREDNISOLONE SODIUM SUCC 125 MG IJ SOLR
60.0000 mg | Freq: Three times a day (TID) | INTRAMUSCULAR | Status: DC
  Administered 2011-05-03 – 2011-05-04 (×4): 60 mg via INTRAVENOUS
  Administered 2011-05-04: 16:00:00 via INTRAVENOUS
  Administered 2011-05-05: 60 mg via INTRAVENOUS
  Filled 2011-05-03 (×6): qty 2

## 2011-05-03 MED ORDER — IBUPROFEN 800 MG PO TABS
400.0000 mg | ORAL_TABLET | Freq: Every day | ORAL | Status: DC | PRN
  Administered 2011-05-03: 400 mg via ORAL
  Filled 2011-05-03: qty 1

## 2011-05-03 MED ORDER — POTASSIUM CHLORIDE IN NACL 20-0.9 MEQ/L-% IV SOLN
INTRAVENOUS | Status: DC
  Administered 2011-05-03: 19:00:00 via INTRAVENOUS
  Administered 2011-05-05: 50 mL via INTRAVENOUS
  Administered 2011-05-05: 10:00:00 via INTRAVENOUS

## 2011-05-03 MED ORDER — IPRATROPIUM BROMIDE 0.02 % IN SOLN
0.5000 mg | RESPIRATORY_TRACT | Status: DC
  Administered 2011-05-03 – 2011-05-08 (×28): 0.5 mg via RESPIRATORY_TRACT
  Filled 2011-05-03 (×29): qty 2.5

## 2011-05-03 MED ORDER — GUAIFENESIN-DM 100-10 MG/5ML PO SYRP
5.0000 mL | ORAL_SOLUTION | ORAL | Status: DC | PRN
  Administered 2011-05-06 – 2011-05-08 (×5): 5 mL via ORAL
  Filled 2011-05-03 (×5): qty 5

## 2011-05-03 NOTE — ED Provider Notes (Signed)
History     CSN: [id number]  Arrival date & time 05/03/11  1407   First MD Initiated Contact with Patient 05/03/11 1426      Chief Complaint  Patient presents with  . Shortness of Breath    (Consider location/radiation/quality/duration/timing/severity/associated sxs/prior treatment) Patient is a 46 y.o. female presenting with shortness of breath. The history is provided by the patient.  Shortness of Breath  Associated symptoms include shortness of breath.   patient presents with a one three-day history of cough and chills and body aches. Cough productive of green-yellow sputum. Patient does have a history of COPD continues to smoke cigarettes. She is on home oxygen at 3 L has had to increase this. She is also an increase in the use of her inhalers as well as nebulizers. She has also noted that she has a fever no medications taken for this. History of pneumonia one month ago and current symptoms are similar.  Patient denies any vomiting diarrhea, rashes, neck pain, photophobia.  Past Medical History  Diagnosis Date  . Diabetes mellitus   . COPD (chronic obstructive pulmonary disease)   . HTN (hypertension)   . Low back pain   . Tachycardia     never had test done since no insurance  . Depression   . Asthma   . Shortness of breath   . Anxiety   . Gastric erosions     EGD 08/2010.  . Internal hemorrhoids     Colonoscopy 5/12.  [name] Heavy menses     Past Surgical History  Procedure Date  . Cholecystectomy 1990  . Cesarean section     twice  . Kidney surgery     as child for blockages  . Tubal ligation   . Tonsillectomy   . Wrist surgery 1995    Lt wrist    Family History  Problem Relation Age of Onset  . Heart attack Father 38    deceased, etoh use  . Heart attack Mother 84    deceased  . Diabetes Mother   . Breast cancer Mother   . Heart failure Mother     oxygen dependence, nonsmoker  . Colon cancer Neg Hx   . Liver disease Maternal Aunt 40    died while on  liver transplant list  . Heart attack Maternal Grandmother     premature CAD  . Ulcers Sister     History  Substance Use Topics  . Smoking status: Current Everyday Smoker -- 1.0 packs/day for 30 years    Types: Cigarettes  . Smokeless tobacco: Current User  . Alcohol Use: No    OB History    Grav Para Term Preterm Abortions TAB SAB Ect Mult Living                  Review of Systems  Respiratory: Positive for shortness of breath.   All other systems reviewed and are negative.    Allergies  Codeine  Home Medications   Current Outpatient Rx  Name Route Sig Dispense Refill  . ALBUTEROL SULFATE HFA 108 (90 BASE) MCG/ACT IN AERS Inhalation Inhale 2 puffs into the lungs every 4 (four) hours as needed for wheezing or shortness of breath. Wheezing/shortnes of breath    . CITALOPRAM HYDROBROMIDE 10 MG PO TABS Oral Take 1 tablet (10 mg total) by mouth daily. 30 tablet 0  . FLUTICASONE PROPIONATE 50 MCG/ACT NA SUSP Nasal Place 2 sprays into the nose daily.    [name] FLUTICASONE-SALMETEROL 500-50 MCG/DOSE  IN AEPB Inhalation Inhale 1 puff into the lungs 2 (two) times daily. 60 each 0  . GLIPIZIDE 10 MG PO TABS Oral Take 10 mg by mouth daily.      . IBUPROFEN 200 MG PO TABS Oral Take 200-400 mg by mouth daily as needed. For pain     . LISINOPRIL 5 MG PO TABS Oral Take 5 mg by mouth daily.      [name] METFORMIN HCL 500 MG PO TABS Oral Take 500 mg by mouth 2 (two) times daily with a meal.     . PREDNISONE 20 MG PO TABS  Take 2 tablets daily for 4 days, then 1 tablet daily for 4 days, then half a tablet daily for 4 days, then STOP. 14 tablet 0  . DAYQUIL/NYQUIL COLD/FLU RELIEF PO Oral Take 2 capsules by mouth as needed. For cold and flu symptoms       BP 116/47  Pulse 122  Temp(Src) 100 F (37.8 C) (Oral)  Resp 28  Ht 5\' 4"  (1.626 m)  Wt 279 lb (126.554 kg)  BMI 47.89 kg/m2  SpO2 95%  Physical Exam  Nursing note and vitals reviewed. Constitutional: She is oriented to person, place, and  time. She appears well-developed and well-nourished.  Non-toxic appearance. No distress.  HENT:  Head: Normocephalic and atraumatic.  Eyes: Conjunctivae, EOM and lids are normal. Pupils are equal, round, and reactive to light.  Neck: Normal range of motion. Neck supple. No tracheal deviation present. No mass present.  Cardiovascular: Regular rhythm and normal heart sounds.  Tachycardia present.  Exam reveals no gallop.   No murmur heard. Pulmonary/Chest: No stridor. Tachypnea noted. No respiratory distress. She has decreased breath sounds. She has wheezes. She has no rhonchi. She has no rales.  Abdominal: Soft. Normal appearance and bowel sounds are normal. She exhibits no distension. There is no tenderness. There is no rebound and no CVA tenderness.  Musculoskeletal: Normal range of motion. She exhibits no edema and no tenderness.  Neurological: She is alert and oriented to person, place, and time. She has normal strength. No cranial nerve deficit or sensory deficit. GCS eye subscore is 4. GCS verbal subscore is 5. GCS motor subscore is 6.  Skin: Skin is warm and dry. No abrasion and no rash noted.  Psychiatric: She has a normal mood and affect. Her speech is normal and behavior is normal.    ED Course  Procedures (including critical care time)   Labs Reviewed  CBC  DIFFERENTIAL  BASIC METABOLIC PANEL  URINALYSIS, ROUTINE W REFLEX MICROSCOPIC  URINE CULTURE   No results found.   No diagnosis found.    MDM  Patient started on antibiotics for her suspected healthcare associated pneumonia. Patient given bronchodilators as well as slight Medrol for a concomitant COPD exacerbation. Spoke with Dr. Sherrie Mustache, triad hospitalist, patient to be admitted to step down  CRITICAL CARE Performed by: Lorre Nick T   Total critical care time: 50  Critical care time was exclusive of separately billable procedures and treating other patients.  Critical care was necessary to treat or prevent  imminent or life-threatening deterioration.  Critical care was time spent personally by me on the following activities: development of treatment plan with patient and/or surrogate as well as nursing, discussions with consultants, evaluation of patient's response to treatment, examination of patient, obtaining history from patient or surrogate, ordering and performing treatments and interventions, ordering and review of laboratory studies, ordering and review of radiographic studies, pulse oximetry and re-evaluation  of patient's condition.         Toy Baker, MD 05/03/11 [phone]

## 2011-05-03 NOTE — ED Notes (Signed)
RT paged.

## 2011-05-03 NOTE — H&P (Signed)
[name] MRN: [id number] DOB/AGE: 46/28/1967 46 y.o. Primary Care Physician:ALLEN,DEBRA, NP, NP Admit date: 05/03/2011 Chief Complaint: Shortness of breath, fever, chills, body aches, and chest tightness.  History of present illness: The patient is a 46 year old woman with a past medical history significant for oxygen-dependent COPD, ongoing tobacco abuse, and recent hospitalization in August of 2012 for COPD with exacerbation and hospitalization in November 2012 for bilateral pneumonia and superimposed COPD exacerbation. She presents to the emergency department again with a chief complaint of shortness of breath and chest tightness. She also has had subjective fever, chills, chest tightness, wheezing, pleurisy, and body aches. Her symptoms started 2 days ago. They have become progressively worse. She has had a runny nose but no ear ache or sore throat. A few days ago, her son had to come to the emergency department for treatment of acute bronchitis. Otherwise, she knows of no other known sick contacts. She acknowledges that she continues to smoke but she has unable to smoke but 5-6 cigarettes over the past 24 hours. She usually smokes a half a pack to a pack of cigarettes per day. She continues to use her oxygen which she had to increase to 3-4 L per minute over the last couple days. She generally uses only 2 L.  In the emergency department, the patient is noted to be tachycardic with a heart rate in the 120s, febrile with a temperature of 100F, and she is oxygenating 95% on 3-4 L of oxygen. Her chest x-ray reveals a focal rounded area of airspace opacity in the right mid lung not definitely seen on the prior studies and bronchitic changes. Her white blood cell count is within normal limits at 9.2. Her other lab data are significant for a serum sodium of 131, glucose of 153, and hemoglobin of 9.0. She is being admitted for further evaluation and management.   Past Medical History  Diagnosis  Date  . Diabetes mellitus   . COPD (chronic obstructive pulmonary disease)   . HTN (hypertension)   . Low back pain   . Tachycardia     never had test done since no insurance  . Depression   . Asthma   . Shortness of breath   . Anxiety   . Gastric erosions     EGD 08/2010.  . Internal hemorrhoids     Colonoscopy 5/12.  [name] Heavy menses   . Chronic respiratory failure with hypoxia     On 2-3 L of oxygen at home    Past Surgical History  Procedure Date  . Cholecystectomy 1990  . Cesarean section     twice  . Kidney surgery     as child for blockages  . Tubal ligation   . Tonsillectomy   . Wrist surgery 1995    Lt wrist    Prior to Admission medications   Medication Sig Start Date End Date Taking? Authorizing Provider  albuterol (PROVENTIL HFA) 108 (90 BASE) MCG/ACT inhaler Inhale 2 puffs into the lungs every 4 (four) hours as needed for wheezing or shortness of breath. Wheezing/shortnes of breath 12/07/10  Yes Elliot Cousin  citalopram (CELEXA) 10 MG tablet Take 1 tablet (10 mg total) by mouth daily. 03/27/11 03/26/12 Yes Nimish C Gosrani  fluticasone (FLONASE) 50 MCG/ACT nasal spray Place 2 sprays into the nose daily. 12/07/10 12/07/11 Yes Deeandra Jerry  ibuprofen (ADVIL,MOTRIN) 200 MG tablet Take 200-400 mg by mouth daily as needed. For pain    Yes Historical Provider, MD  lisinopril (PRINIVIL,ZESTRIL)  5 MG tablet Take 5 mg by mouth daily.     Yes Historical Provider, MD  metFORMIN (GLUCOPHAGE) 500 MG tablet Take 500 mg by mouth 2 (two) times daily with a meal.    Yes Historical Provider, MD    Allergies:  Allergies  Allergen Reactions  . Codeine Itching and Nausea Only    Family History  Problem Relation Age of Onset  . Heart attack Father 65    deceased, etoh use  . Heart attack Mother 33    deceased  . Diabetes Mother   . Breast cancer Mother   . Heart failure Mother     oxygen dependence, nonsmoker  . Colon cancer Neg Hx   . Liver disease Maternal Aunt 40     died while on liver transplant list  . Heart attack Maternal Grandmother     premature CAD  . Ulcers Sister     Social History: She is single. She lives in [place]. She has 2 children. She lives with her son. She was approved for Medicaid. She smokes a half a pack to one pack of cigarettes per day, decreased from 2 packs of cigarettes per day. She denies illicit drug use and alcohol use.    ROS: Her review of systems is positive for what is above in the history of present illness. In addition, it is positive for chronically loose stools, your pressure, and low back pain. Otherwise review of systems is negative.  PHYSICAL EXAM: Blood pressure 117/62, pulse 116, temperature 100 F (37.8 C), temperature source Oral, resp. rate 24, height 5\' 4"  (1.626 m), weight 126.554 kg (279 lb), SpO2 93.00%.  General: The patient is an alert obese 46 year old Caucasian woman who is currently sitting up in bed in the hallway in the emergency department. Therefore, the exam is somewhat limited. HEENT: Head is normocephalic, nontraumatic. Pupils are equal, round, and reactive to light. Extraocular limits are intact. Conjunctivae are clear. Sclerae are white. Tympanic membranes and oral pharynx not examined due to limited privacy. Lungs: Diffuse bilateral wheezes Heart: S1, S2, with tachycardia. Abdomen: Deferred Extremities: Trace of pedal edema bilaterally. Neurologic: Alert and oriented x3. Cranial nerves II through XII are intact.  Basic Metabolic Panel:  Basename 05/03/11 1520  NA 131*  K 3.6  CL 94*  CO2 28  GLUCOSE 153*  BUN 8  CREATININE 0.78  CALCIUM 9.4  MG --  PHOS --   Liver Function Tests: No results found for this basename: AST:2,ALT:2,ALKPHOS:2,BILITOT:2,PROT:2,ALBUMIN:2 in the last 72 hours No results found for this basename: LIPASE:2,AMYLASE:2 in the last 72 hours No results found for this basename: AMMONIA:2 in the last 72 hours CBC:  Basename 05/03/11 1520  WBC 9.3    NEUTROABS 6.8  HGB 9.0*  HCT 31.6*  MCV 69.1*  PLT 284   Cardiac Enzymes: No results found for this basename: CKTOTAL:3,CKMB:3,CKMBINDEX:3,TROPONINI:3 in the last 72 hours BNP: No results found for this basename: PROBNP:3 in the last 72 hours D-Dimer: No results found for this basename: DDIMER:2 in the last 72 hours CBG: No results found for this basename: GLUCAP:6 in the last 72 hours Hemoglobin A1C: No results found for this basename: HGBA1C in the last 72 hours Fasting Lipid Panel: No results found for this basename: CHOL,HDL,LDLCALC,TRIG,CHOLHDL,LDLDIRECT in the last 72 hours Thyroid Function Tests: No results found for this basename: TSH,T4TOTAL,FREET4,T3FREE,THYROIDAB in the last 72 hours Anemia Panel: No results found for this basename: VITAMINB12,FOLATE,FERRITIN,TIBC,IRON,RETICCTPCT in the last 72 hours Coagulation: No results found for this basename:  LABPROT:2,INR:2 in the last 72 hours Urine Drug Screen: Drugs of Abuse     Component Value Date/Time   LABOPIA POSITIVE* 10/28/2007 2113   COCAINSCRNUR NONE DETECTED 10/28/2007 2113   LABBENZ NONE DETECTED 10/28/2007 2113   AMPHETMU NONE DETECTED 10/28/2007 2113   THCU POSITIVE* 10/28/2007 2113   LABBARB  Value: NONE DETECTED        DRUG SCREEN FOR MEDICAL PURPOSES ONLY.  IF CONFIRMATION IS NEEDED FOR ANY PURPOSE, NOTIFY LAB WITHIN 5 DAYS. 10/28/2007 2113    Alcohol Level: No results found for this basename: ETH:2 in the last 72 hours   EKG: Pending.  No results found for this or any previous visit (from the past 240 hour(s)).   Results for orders placed during the hospital encounter of 05/03/11 (from the past 48 hour(s))  CBC     Status: Abnormal   Collection Time   05/03/11  3:20 PM      Component Value Range Comment   WBC 9.3  4.0 - 10.5 (K/uL)    RBC 4.57  3.87 - 5.11 (MIL/uL)    Hemoglobin 9.0 (*) 12.0 - 15.0 (g/dL)    HCT 16.1 (*) 09.6 - 46.0 (%)    MCV 69.1 (*) 78.0 - 100.0 (fL)    MCH 19.7 (*) 26.0 - 34.0 (pg)     MCHC 28.5 (*) 30.0 - 36.0 (g/dL)    RDW 04.5 (*) 40.9 - 15.5 (%)    Platelets 284  150 - 400 (K/uL)   DIFFERENTIAL     Status: Normal   Collection Time   05/03/11  3:20 PM      Component Value Range Comment   Neutrophils Relative 73  43 - 77 (%)    Lymphocytes Relative 16  12 - 46 (%)    Monocytes Relative 11  3 - 12 (%)    Eosinophils Relative 0  0 - 5 (%)    Basophils Relative 0  0 - 1 (%)    Neutro Abs 6.8  1.7 - 7.7 (K/uL)    Lymphs Abs 1.5  0.7 - 4.0 (K/uL)    Monocytes Absolute 1.0  0.1 - 1.0 (K/uL)    Eosinophils Absolute 0.0  0.0 - 0.7 (K/uL)    Basophils Absolute 0.0  0.0 - 0.1 (K/uL)    RBC Morphology POLYCHROMASIA PRESENT   ELLIPTOCYTES   WBC Morphology ATYPICAL MONONUCLEAR CELLS      Smear Review LARGE PLATELETS PRESENT     BASIC METABOLIC PANEL     Status: Abnormal   Collection Time   05/03/11  3:20 PM      Component Value Range Comment   Sodium 131 (*) 135 - 145 (mEq/L)    Potassium 3.6  3.5 - 5.1 (mEq/L)    Chloride 94 (*) 96 - 112 (mEq/L)    CO2 28  19 - 32 (mEq/L)    Glucose, Bld 153 (*) 70 - 99 (mg/dL)    BUN 8  6 - 23 (mg/dL)    Creatinine, Ser 8.11  0.50 - 1.10 (mg/dL)    Calcium 9.4  8.4 - 10.5 (mg/dL)    GFR calc non Af Amer >90  >90 (mL/min)    GFR calc Af Amer >90  >90 (mL/min)     Dg Chest 2 View  05/03/2011  *RADIOLOGY REPORT*  Clinical Data: Chest pain and cough.  CHEST - 2 VIEW  Comparison: Chest 03/26/2011.  Findings: There is a rounded focus of airspace opacity in the right mid  lung seen on the PA view only.  Left lung is clear. Peribronchial thickening is noted.  No pneumothorax or pleural effusion.  Heart size is normal.  IMPRESSION:  1.  Focal, rounded area of airspace opacity in the right mid lung is not definitely seen on prior studies and may be due to infectious or inflammatory process.  Recommend follow-up plain films in 4-6 weeks to ensure clearing. 2.  Bronchitic change.  Original Report Authenticated By: Bernadene Bell. Maricela Curet, M.D.     Impression:  Active Problems:  Microcytic anemia  COPD with acute exacerbation  HTN (hypertension)  DM type 2 (diabetes mellitus, type 2)  Tobacco abuse  Gastric erosions  Sinus tachycardia  Hyponatremia  Pneumonia  1. Acute respiratory distress with recurrent pneumonia and COPD with acute exacerbation. Given the recent flu epidemic, she may also have superimposed influenza pneumonia or viral syndrome.  Ongoing tobacco abuse. The patient was strongly advised to stop smoking permanently.  Sinus tachycardia, likely secondary to respiratory distress and bronchodilator therapy.  Hyponatremia. As during recent hospitalizations, her hyponatremia is usually secondary to mild volume depletion and is usually corrected with IV fluids.  Type 2 diabetes mellitus. Treated chronically with oral agents.  Hypertension. Oertli stable.  Microcytic anemia and with a history of gastric erosions.       Plan:  1. We'll order blood cultures. The patient was given Zosyn and vancomycin in the emergency department. We will continue antibiotic treatment with Teflaro. We'll also add Tamiflu empirically. We'll start Solu-Medrol at 60 mg every 6 hours. We'll continue bronchodilator therapy with Atrovent and albuterol nebulizations. We'll add a nicotine patch. We'll order tobacco cessation counseling. Start symptomatic treatment with as needed Tussionex and Tessalon Perles. We'll add H2 blocker. For further evaluation, we will order a urine Legionella antigen, strep pneumo antigen, and viral influenza panel if available.      Tiphani Mells 05/03/2011, 5:19 PM

## 2011-05-03 NOTE — ED Notes (Signed)
Patient unable to urinate.  Advised her to let us know as soon as possible.

## 2011-05-03 NOTE — ED Notes (Signed)
Pt reports had pneumonia approx one month ago.  Pt says has had cough since Monday with chills and body aches.  Pt c/o SOB.   Pt on oxygen 3Liters at home.

## 2011-05-03 NOTE — ED Notes (Signed)
Asked EDP prior to administering antibiotics if he wanted me to collect blood cultures and EDP said no.

## 2011-05-03 NOTE — ED Notes (Signed)
Water given to patient.  Patient still unable to give urine specimen.

## 2011-05-04 ENCOUNTER — Encounter (HOSPITAL_COMMUNITY): Payer: Self-pay

## 2011-05-04 DIAGNOSIS — F32A Depression, unspecified: Secondary | ICD-10-CM | POA: Diagnosis present

## 2011-05-04 DIAGNOSIS — F329 Major depressive disorder, single episode, unspecified: Secondary | ICD-10-CM | POA: Diagnosis present

## 2011-05-04 LAB — COMPREHENSIVE METABOLIC PANEL
ALT: 36 U/L — ABNORMAL HIGH (ref 0–35)
AST: 41 U/L — ABNORMAL HIGH (ref 0–37)
Albumin: 3.3 g/dL — ABNORMAL LOW (ref 3.5–5.2)
Alkaline Phosphatase: 57 U/L (ref 39–117)
BUN: 9 mg/dL (ref 6–23)
CO2: 30 mEq/L (ref 19–32)
Calcium: 9.5 mg/dL (ref 8.4–10.5)
Chloride: 97 mEq/L (ref 96–112)
Creatinine, Ser: 0.61 mg/dL (ref 0.50–1.10)
GFR calc Af Amer: >90 mL/min (ref >90)
GFR calc non Af Amer: >90 mL/min (ref >90)
Glucose, Bld: 259 mg/dL — ABNORMAL HIGH (ref 70–99)
Potassium: 4.4 mEq/L (ref 3.5–5.1)
Sodium: 134 mEq/L — ABNORMAL LOW (ref 135–145)
Total Bilirubin: 0.3 mg/dL (ref 0.3–1.2)
Total Protein: 7.3 g/dL (ref 6.0–8.3)

## 2011-05-04 LAB — URINE MICROSCOPIC-ADD ON

## 2011-05-04 LAB — GLUCOSE, CAPILLARY
Glucose-Capillary: 295 mg/dL — ABNORMAL HIGH (ref 70–99)
Glucose-Capillary: 255 mg/dL — ABNORMAL HIGH (ref 70–99)
Glucose-Capillary: 254 mg/dL — ABNORMAL HIGH (ref 70–99)
Glucose-Capillary: 303 mg/dL — ABNORMAL HIGH (ref 70–99)

## 2011-05-04 LAB — CBC
HCT: 31.4 % — ABNORMAL LOW (ref 36.0–46.0)
Hemoglobin: 8.8 g/dL — ABNORMAL LOW (ref 12.0–15.0)
MCH: 19.6 pg — ABNORMAL LOW (ref 26.0–34.0)
MCHC: 28 g/dL — ABNORMAL LOW (ref 30.0–36.0)
MCV: 69.8 fL — ABNORMAL LOW (ref 78.0–100.0)
Platelets: 270 10*3/uL (ref 150–400)
RBC: 4.5 MIL/uL (ref 3.87–5.11)
RDW: 18.8 % — ABNORMAL HIGH (ref 11.5–15.5)
WBC: 4.9 10*3/uL (ref 4.0–10.5)

## 2011-05-04 LAB — URINALYSIS, ROUTINE W REFLEX MICROSCOPIC
Bilirubin Urine: NEGATIVE
Glucose, UA: NEGATIVE mg/dL
Leukocytes, UA: NEGATIVE
Nitrite: NEGATIVE
Protein, ur: NEGATIVE mg/dL
Specific Gravity, Urine: <1.005 — ABNORMAL LOW (ref 1.005–1.030)
Urobilinogen, UA: 0.2 mg/dL (ref 0.0–1.0)
pH: 6 (ref 5.0–8.0)

## 2011-05-04 LAB — URINE CULTURE
Colony Count: NO GROWTH
Culture  Setup Time: 201301101400
Culture: NO GROWTH

## 2011-05-04 LAB — LEGIONELLA ANTIGEN, URINE: Legionella Antigen, Urine: NEGATIVE

## 2011-05-04 LAB — HEMOGLOBIN A1C
Hgb A1c MFr Bld: 7.3 % — ABNORMAL HIGH (ref <5.7)
Mean Plasma Glucose: 163 mg/dL — ABNORMAL HIGH (ref <117)

## 2011-05-04 LAB — STREP PNEUMONIAE URINARY ANTIGEN: Strep Pneumo Urinary Antigen: NEGATIVE

## 2011-05-04 MED ORDER — CEFTAROLINE FOSAMIL 600 MG IV SOLR
INTRAVENOUS | Status: AC
  Filled 2011-05-04: qty 600

## 2011-05-04 MED ORDER — INSULIN GLARGINE 100 UNIT/ML ~~LOC~~ SOLN
15.0000 [IU] | Freq: Two times a day (BID) | SUBCUTANEOUS | Status: DC
  Administered 2011-05-04 (×2): 15 [IU] via SUBCUTANEOUS

## 2011-05-04 MED ORDER — CITALOPRAM HYDROBROMIDE 20 MG PO TABS
10.0000 mg | ORAL_TABLET | ORAL | Status: DC
  Administered 2011-05-06 – 2011-05-08 (×2): 10 mg via ORAL
  Filled 2011-05-04 (×2): qty 1

## 2011-05-04 MED ORDER — BUPROPION HCL 75 MG PO TABS
75.0000 mg | ORAL_TABLET | Freq: Two times a day (BID) | ORAL | Status: DC
  Administered 2011-05-04 – 2011-05-08 (×9): 75 mg via ORAL
  Filled 2011-05-04 (×13): qty 1

## 2011-05-04 NOTE — Progress Notes (Signed)
UR Chart Review Completed  

## 2011-05-04 NOTE — Progress Notes (Signed)
Subjective: The patient says that she is breathing better. She still has some chest congestion. She really wants to stop smoking and has been struggling. She has a friend who stopped smoking while on Wellbutrin. She was to try Wellbutrin for both smoking cessation and depression.  Objective: Vital signs in last 24 hours: Filed Vitals:   05/04/11 0700 05/04/11 0800 05/04/11 0831 05/04/11 0900  BP: 107/22 108/74  125/66  Pulse: 78 94  118  Temp:  97.9 F (36.6 C)    TempSrc:  Oral    Resp: 20 21  16   Height:      Weight:      SpO2: 90% 88% 92% 93%    Intake/Output Summary (Last 24 hours) at 05/04/11 1039 Last data filed at 05/04/11 0800  Gross per 24 hour  Intake 1242.5 ml  Output    400 ml  Net  842.5 ml    Weight change:   Physical exam: HEENT: Head is normocephalic, nontraumatic. Pupils are equal, round, and reactive to light. Tympanic membrane on the right has a chronic rupture (per patient), with mild serous fluid. Tympanic membrane on the left without erythema or edema. Oropharynx reveals moist mucous membranes with no posterior exudates or erythema. Nasal mucosa is moist without active rhinorrhea. Lungs: Mild diffuse bilateral wheezes. Heart: S1, S2, with a soft systolic murmur and tachycardia. Abdomen: Morbidly obese, positive bowel sounds, nontender, nondistended. Extremities: No pedal edema. Neurologic/psychological:. She is alert and oriented x3. She has a flat affect. She has some depression but denies suicidal ideation.  Lab Results: Basic Metabolic Panel:  Basename 05/04/11 0529 05/03/11 1520  NA 134* 131*  K 4.4 3.6  CL 97 94*  CO2 30 28  GLUCOSE 259* 153*  BUN 9 8  CREATININE 0.61 0.78  CALCIUM 9.5 9.4  MG -- --  PHOS -- --   Liver Function Tests:  [facility] 05/04/11 0529  AST 41*  ALT 36*  ALKPHOS 57  BILITOT 0.3  PROT 7.3  ALBUMIN 3.3*   No results found for this basename: LIPASE:2,AMYLASE:2 in the last 72 hours No results found for this  basename: AMMONIA:2 in the last 72 hours CBC:  Basename 05/04/11 0529 05/03/11 1520  WBC 4.9 9.3  NEUTROABS -- 6.8  HGB 8.8* 9.0*  HCT 31.4* 31.6*  MCV 69.8* 69.1*  PLT 270 284   Cardiac Enzymes: No results found for this basename: CKTOTAL:3,CKMB:3,CKMBINDEX:3,TROPONINI:3 in the last 72 hours BNP: No results found for this basename: PROBNP:3 in the last 72 hours D-Dimer: No results found for this basename: DDIMER:2 in the last 72 hours CBG:  Basename 05/04/11 0813 05/03/11 2314 05/03/11 1824  GLUCAP 295* 219* 191*   Hemoglobin A1C: No results found for this basename: HGBA1C in the last 72 hours Fasting Lipid Panel: No results found for this basename: CHOL,HDL,LDLCALC,TRIG,CHOLHDL,LDLDIRECT in the last 72 hours Thyroid Function Tests: No results found for this basename: TSH,T4TOTAL,FREET4,T3FREE,THYROIDAB in the last 72 hours Anemia Panel: No results found for this basename: VITAMINB12,FOLATE,FERRITIN,TIBC,IRON,RETICCTPCT in the last 72 hours Coagulation: No results found for this basename: LABPROT:2,INR:2 in the last 72 hours Urine Drug Screen: Drugs of Abuse     Component Value Date/Time   LABOPIA POSITIVE* 10/28/2007 2113   COCAINSCRNUR NONE DETECTED 10/28/2007 2113   LABBENZ NONE DETECTED 10/28/2007 2113   AMPHETMU NONE DETECTED 10/28/2007 2113   THCU POSITIVE* 10/28/2007 2113   LABBARB  Value: NONE DETECTED        DRUG SCREEN FOR MEDICAL PURPOSES ONLY.  IF  CONFIRMATION IS NEEDED FOR ANY PURPOSE, NOTIFY LAB WITHIN 5 DAYS. 10/28/2007 2113    Alcohol Level: No results found for this basename: ETH:2 in the last 72 hours  Micro: Recent Results (from the past 240 hour(s))  CULTURE, BLOOD (ROUTINE X 2)     Status: Normal (Preliminary result)   Collection Time   05/03/11  5:25 PM      Component Value Range Status Comment   Specimen Description BLOOD RIGHT WRIST   Final    Special Requests     Final    Value: BOTTLES DRAWN AEROBIC AND ANAEROBIC AEB=12CC ANA=6CC   Culture NO GROWTH  1 DAY   Final    Report Status PENDING   Incomplete   CULTURE, BLOOD (ROUTINE X 2)     Status: Normal (Preliminary result)   Collection Time   05/03/11  5:27 PM      Component Value Range Status Comment   Specimen Description BLOOD LEFT ANTECUBITAL   Final    Special Requests     Final    Value: BOTTLES DRAWN AEROBIC AND ANAEROBIC AEB=10CC ANA=6CC   Culture NO GROWTH 1 DAY   Final    Report Status PENDING   Incomplete   MRSA PCR SCREENING     Status: Normal   Collection Time   05/03/11  5:59 PM      Component Value Range Status Comment   MRSA by PCR NEGATIVE  NEGATIVE  Final     Studies/Results: Dg Chest 2 View  05/03/2011  *RADIOLOGY REPORT*  Clinical Data: Chest pain and cough.  CHEST - 2 VIEW  Comparison: Chest 03/26/2011.  Findings: There is a rounded focus of airspace opacity in the right mid lung seen on the PA view only.  Left lung is clear. Peribronchial thickening is noted.  No pneumothorax or pleural effusion.  Heart size is normal.  IMPRESSION:  1.  Focal, rounded area of airspace opacity in the right mid lung is not definitely seen on prior studies and may be due to infectious or inflammatory process.  Recommend follow-up plain films in 4-6 weeks to ensure clearing. 2.  Bronchitic change.  Original Report Authenticated By: Bernadene Bell. Maricela Curet, M.D.    Medications: I have reviewed the patient's current medications.  Assessment: Active Problems:  Microcytic anemia  COPD with acute exacerbation  HTN (hypertension)  DM type 2 (diabetes mellitus, type 2)  Tobacco abuse  Gastric erosions  Sinus tachycardia  Hyponatremia  Pneumonia  Depression  1. Recurrent pneumonia. She is being treated with Teflaro. Also, given her symptomatology and recent influenza outbreak, she is being treated empirically with Tamiflu.  COPD with bronchitic exacerbation. She is being treated with albuterol and Atrovent nebulizations, Solu-Medrol, and supportive treatment.  Ongoing tobacco abuse. She  desires to stop smoking. She uses nicotine patches which were given as Christmas gifts from her family. She wants to try Wellbutrin not only for tobacco cessation but also for depression.  Chronic depression. She was started on Celexa in November. She had not been able to acquire Celexa because of cost, however, she had been taking half of her son Celexa. Therefore, she had been only taking 5 mg daily.  Type 2 diabetes mellitus. CBGs are slightly uncontrolled because of steroids. Lantus will be added.  Hypertension. Currently stable.  Microcytic anemia with a history of GI bleeding. She is on Pepcid.  Hyponatremia, resolving with IV fluid hydration.  Plan:  1. We'll at Lantus for better glycemic control. We'll change Celexa  to 5 mg every other day and start Wellbutrin and titrate accordingly.    LOS: 1 day   Thomes Burak 05/04/2011, 10:39 AM

## 2011-05-05 ENCOUNTER — Inpatient Hospital Stay (HOSPITAL_COMMUNITY): Payer: Medicaid Other

## 2011-05-05 LAB — GLUCOSE, CAPILLARY
Glucose-Capillary: 227 mg/dL — ABNORMAL HIGH (ref 70–99)
Glucose-Capillary: 233 mg/dL — ABNORMAL HIGH (ref 70–99)
Glucose-Capillary: 176 mg/dL — ABNORMAL HIGH (ref 70–99)
Glucose-Capillary: 229 mg/dL — ABNORMAL HIGH (ref 70–99)

## 2011-05-05 LAB — CBC
HCT: 32.4 % — ABNORMAL LOW (ref 36.0–46.0)
Hemoglobin: 9.3 g/dL — ABNORMAL LOW (ref 12.0–15.0)
MCH: 20.3 pg — ABNORMAL LOW (ref 26.0–34.0)
MCHC: 28.7 g/dL — ABNORMAL LOW (ref 30.0–36.0)
MCV: 70.7 fL — ABNORMAL LOW (ref 78.0–100.0)
Platelets: 356 10*3/uL (ref 150–400)
RBC: 4.58 MIL/uL (ref 3.87–5.11)
RDW: 19.1 % — ABNORMAL HIGH (ref 11.5–15.5)
WBC: 19.4 10*3/uL — ABNORMAL HIGH (ref 4.0–10.5)

## 2011-05-05 LAB — BASIC METABOLIC PANEL
BUN: 10 mg/dL (ref 6–23)
CO2: 29 mEq/L (ref 19–32)
Calcium: 9.7 mg/dL (ref 8.4–10.5)
Chloride: 101 mEq/L (ref 96–112)
Creatinine, Ser: 0.62 mg/dL (ref 0.50–1.10)
GFR calc Af Amer: >90 mL/min (ref >90)
GFR calc non Af Amer: >90 mL/min (ref >90)
Glucose, Bld: 241 mg/dL — ABNORMAL HIGH (ref 70–99)
Potassium: 4.2 mEq/L (ref 3.5–5.1)
Sodium: 138 mEq/L (ref 135–145)

## 2011-05-05 MED ORDER — INSULIN GLARGINE 100 UNIT/ML ~~LOC~~ SOLN
20.0000 [IU] | Freq: Two times a day (BID) | SUBCUTANEOUS | Status: DC
  Administered 2011-05-05 – 2011-05-07 (×5): 20 [IU] via SUBCUTANEOUS

## 2011-05-05 MED ORDER — BIOTENE DRY MOUTH MT LIQD: 15.0000 mL | OROMUCOSAL | Status: DC | PRN

## 2011-05-05 MED ORDER — IOHEXOL 350 MG/ML SOLN
100.0000 mL | Freq: Once | INTRAVENOUS | Status: AC | PRN
  Administered 2011-05-05: 100 mL via INTRAVENOUS

## 2011-05-05 MED ORDER — METHYLPREDNISOLONE SODIUM SUCC 125 MG IJ SOLR
80.0000 mg | Freq: Three times a day (TID) | INTRAMUSCULAR | Status: DC
  Administered 2011-05-05 – 2011-05-06 (×4): 80 mg via INTRAVENOUS
  Filled 2011-05-05 (×5): qty 2

## 2011-05-05 MED ORDER — OXYCODONE-ACETAMINOPHEN 5-325 MG PO TABS
1.0000 | ORAL_TABLET | ORAL | Status: DC | PRN
  Administered 2011-05-05 – 2011-05-08 (×13): 1 via ORAL
  Filled 2011-05-05 (×13): qty 1

## 2011-05-05 NOTE — Progress Notes (Signed)
Subjective: She says she is about the same as she was yesterday. She complains of a global headache. She says the Vicodin causes her to itch and she would like Percocet instead for pain. .  Objective: Vital signs in last 24 hours: Filed Vitals:   05/05/11 0600 05/05/11 0700 05/05/11 0800 05/05/11 0831  BP:  143/75    Pulse: 108 108 116 122  Temp:   98 F (36.7 C)   TempSrc:   Oral   Resp: 21 19 21 21   Height:      Weight:      SpO2: 97% 94% 91% 94%    Intake/Output Summary (Last 24 hours) at 05/05/11 0917 Last data filed at 05/05/11 0800  Gross per 24 hour  Intake 2824.58 ml  Output   1801 ml  Net 1023.58 ml    Weight change: -2.054 kg (-4 lb 8.4 oz)  Physical exam: Lungs: Mild diffuse bilateral wheezes, congested cough. Heart: S1, S2, with a soft systolic murmur and tachycardia. Abdomen: Morbidly obese, positive bowel sounds, nontender, nondistended. Extremities: No pedal edema. Neurologic/psychological:. She is alert and oriented x3. She has a flat affect. She has some depression but denies suicidal ideation.  Lab Results: Basic Metabolic Panel:  Basename 05/05/11 0528 05/04/11 0529  NA 138 134*  K 4.2 4.4  CL 101 97  CO2 29 30  GLUCOSE 241* 259*  BUN 10 9  CREATININE 0.62 0.61  CALCIUM 9.7 9.5  MG -- --  PHOS -- --   Liver Function Tests:  [facility] 05/04/11 0529  AST 41*  ALT 36*  ALKPHOS 57  BILITOT 0.3  PROT 7.3  ALBUMIN 3.3*   No results found for this basename: LIPASE:2,AMYLASE:2 in the last 72 hours No results found for this basename: AMMONIA:2 in the last 72 hours CBC:  Basename 05/05/11 0528 05/04/11 0529 05/03/11 1520  WBC 19.4* 4.9 --  NEUTROABS -- -- 6.8  HGB 9.3* 8.8* --  HCT 32.4* 31.4* --  MCV 70.7* 69.8* --  PLT 356 270 --   Cardiac Enzymes: No results found for this basename: CKTOTAL:3,CKMB:3,CKMBINDEX:3,TROPONINI:3 in the last 72 hours BNP: No results found for this basename: PROBNP:3 in the last 72 hours D-Dimer: No  results found for this basename: DDIMER:2 in the last 72 hours CBG:  Basename 05/05/11 0800 05/04/11 2124 05/04/11 1627 05/04/11 1158 05/04/11 0813 05/03/11 2314  GLUCAP 227* 303* 254* 255* 295* 219*   Hemoglobin A1C:  Basename 05/04/11 0529  HGBA1C 7.3*   Fasting Lipid Panel: No results found for this basename: CHOL,HDL,LDLCALC,TRIG,CHOLHDL,LDLDIRECT in the last 72 hours Thyroid Function Tests: No results found for this basename: TSH,T4TOTAL,FREET4,T3FREE,THYROIDAB in the last 72 hours Anemia Panel: No results found for this basename: VITAMINB12,FOLATE,FERRITIN,TIBC,IRON,RETICCTPCT in the last 72 hours Coagulation: No results found for this basename: LABPROT:2,INR:2 in the last 72 hours Urine Drug Screen: Drugs of Abuse     Component Value Date/Time   LABOPIA POSITIVE* 10/28/2007 2113   COCAINSCRNUR NONE DETECTED 10/28/2007 2113   LABBENZ NONE DETECTED 10/28/2007 2113   AMPHETMU NONE DETECTED 10/28/2007 2113   THCU POSITIVE* 10/28/2007 2113   LABBARB  Value: NONE DETECTED        DRUG SCREEN FOR MEDICAL PURPOSES ONLY.  IF CONFIRMATION IS NEEDED FOR ANY PURPOSE, NOTIFY LAB WITHIN 5 DAYS. 10/28/2007 2113    Alcohol Level: No results found for this basename: ETH:2 in the last 72 hours  Micro: Recent Results (from the past 240 hour(s))  CULTURE, BLOOD (ROUTINE X 2)  Status: Normal (Preliminary result)   Collection Time   05/03/11  5:25 PM      Component Value Range Status Comment   Specimen Description BLOOD RIGHT WRIST   Final    Special Requests     Final    Value: BOTTLES DRAWN AEROBIC AND ANAEROBIC AEB=12CC ANA=6CC   Culture NO GROWTH 1 DAY   Final    Report Status PENDING   Incomplete   CULTURE, BLOOD (ROUTINE X 2)     Status: Normal (Preliminary result)   Collection Time   05/03/11  5:27 PM      Component Value Range Status Comment   Specimen Description BLOOD LEFT ANTECUBITAL   Final    Special Requests     Final    Value: BOTTLES DRAWN AEROBIC AND ANAEROBIC AEB=10CC ANA=6CC     Culture NO GROWTH 1 DAY   Final    Report Status PENDING   Incomplete   MRSA PCR SCREENING     Status: Normal   Collection Time   05/03/11  5:59 PM      Component Value Range Status Comment   MRSA by PCR NEGATIVE  NEGATIVE  Final     Studies/Results: Dg Chest 2 View  05/03/2011  *RADIOLOGY REPORT*  Clinical Data: Chest pain and cough.  CHEST - 2 VIEW  Comparison: Chest 03/26/2011.  Findings: There is a rounded focus of airspace opacity in the right mid lung seen on the PA view only.  Left lung is clear. Peribronchial thickening is noted.  No pneumothorax or pleural effusion.  Heart size is normal.  IMPRESSION:  1.  Focal, rounded area of airspace opacity in the right mid lung is not definitely seen on prior studies and may be due to infectious or inflammatory process.  Recommend follow-up plain films in 4-6 weeks to ensure clearing. 2.  Bronchitic change.  Original Report Authenticated By: Bernadene Bell. Maricela Curet, M.D.    Medications: I have reviewed the patient's current medications.  Assessment: Active Problems:  Microcytic anemia  COPD with acute exacerbation  HTN (hypertension)  DM type 2 (diabetes mellitus, type 2)  Tobacco abuse  Gastric erosions  Sinus tachycardia  Hyponatremia  Pneumonia  Depression  Leukocytosis  1. Recurrent pneumonia. She is being treated with Teflaro. Also, given her symptomatology and recent influenza outbreak, she is being treated empirically with Tamiflu. Because this is the patient's third hospitalization in approximately 6 months, however get a CT angiogram of her chest to rule out PE and to further assess for pneumonia.  Oxygen-dependent COPD with bronchitic exacerbation. She is being treated with albuterol and Atrovent nebulizations, Solu-Medrol, and supportive treatment.  Ongoing tobacco abuse. She desires to stop smoking. She uses nicotine patches which were given as Christmas gifts from her family. Wellbutrin was started for tobacco cessation and  secondarily for depression.  Chronic depression. She was started on Celexa in November. She had not been able to acquire Celexa because of cost, however, she had been taking half a tablet of her son's Celexa. Therefore, she had been only taking 5 mg daily. Wellbutrin has been started. Celexa has been changed to every other day dosing.  Type 2 diabetes mellitus. CBGs are slightly uncontrolled because of steroids. Lantus was added and being titrated accordingly.  Hypertension. Currently stable.  Microcytic anemia with a history of GI bleeding. She is on Pepcid.  Hyponatremia, resolving with IV fluid hydration.  Leukocytosis, steroid-induced.  Plan:  Will increase Lantus to 20 units twice a day. We'll  order a CT angiogram of her chest for further evaluation. Transfer out of the step down unit today.    LOS: 2 days   [name] 05/05/2011, 9:17 AM

## 2011-05-06 LAB — GLUCOSE, CAPILLARY
Glucose-Capillary: 230 mg/dL — ABNORMAL HIGH (ref 70–99)
Glucose-Capillary: 305 mg/dL — ABNORMAL HIGH (ref 70–99)
Glucose-Capillary: 157 mg/dL — ABNORMAL HIGH (ref 70–99)
Glucose-Capillary: 354 mg/dL — ABNORMAL HIGH (ref 70–99)

## 2011-05-06 MED ORDER — BIOTENE DRY MOUTH MT LIQD
15.0000 mL | Freq: Two times a day (BID) | OROMUCOSAL | Status: DC
  Administered 2011-05-06 – 2011-05-08 (×4): 15 mL via OROMUCOSAL

## 2011-05-06 MED ORDER — PREDNISONE 20 MG PO TABS
40.0000 mg | ORAL_TABLET | Freq: Two times a day (BID) | ORAL | Status: DC
  Administered 2011-05-06 – 2011-05-08 (×4): 40 mg via ORAL
  Filled 2011-05-06 (×4): qty 2

## 2011-05-06 MED ORDER — AZITHROMYCIN 250 MG PO TABS
500.0000 mg | ORAL_TABLET | Freq: Every day | ORAL | Status: DC
  Administered 2011-05-06 – 2011-05-08 (×3): 500 mg via ORAL
  Filled 2011-05-06 (×3): qty 2

## 2011-05-06 MED ORDER — PANTOPRAZOLE SODIUM 40 MG PO TBEC
40.0000 mg | DELAYED_RELEASE_TABLET | Freq: Every day | ORAL | Status: DC
  Administered 2011-05-06 – 2011-05-08 (×3): 40 mg via ORAL
  Filled 2011-05-06 (×3): qty 1

## 2011-05-06 NOTE — Progress Notes (Signed)
Chart reviewed.  Subjective: Complains of cough. Heartburn. Pepcid not helping. Requesting protonix. Objective: Vital signs in last 24 hours: Filed Vitals:   05/06/11 0421 05/06/11 0500 05/06/11 1106 05/06/11 1452  BP:  124/79  131/77  Pulse:  111  70  Temp:  96.7 F (35.9 C)  97.7 F (36.5 C)  TempSrc:  Axillary  Oral  Resp:  22 20 20   Height:      Weight:      SpO2: 94% 94% 97% 94%   Weight change:   Intake/Output Summary (Last 24 hours) at 05/06/11 1622 Last data filed at 05/06/11 1300  Gross per 24 hour  Intake   1560 ml  Output      0 ml  Net   1560 ml   Physical Exam: General: Appears ill. Coughing, weak. Lungs: Bilateral wheezes, good air movement. Cardiovascular regular rate rhythm without murmurs gallops rubs Abdomen, obese, soft nontender Extremities no clubbing cyanosis or edema  Lab Results: Basic Metabolic Panel:  Lab 05/05/11 1610 05/04/11 0529  NA 138 134*  K 4.2 4.4  CL 101 97  CO2 29 30  GLUCOSE 241* 259*  BUN 10 9  CREATININE 0.62 0.61  CALCIUM 9.7 9.5  MG -- --  PHOS -- --   Liver Function Tests:  Lab 05/04/11 0529  AST 41*  ALT 36*  ALKPHOS 57  BILITOT 0.3  PROT 7.3  ALBUMIN 3.3*   No results found for this basename: LIPASE:2,AMYLASE:2 in the last 168 hours No results found for this basename: AMMONIA:2 in the last 168 hours CBC:  Lab 05/05/11 0528 05/04/11 0529 05/03/11 1520  WBC 19.4* 4.9 --  NEUTROABS -- -- 6.8  HGB 9.3* 8.8* --  HCT 32.4* 31.4* --  MCV 70.7* 69.8* --  PLT 356 270 --   Cardiac Enzymes: No results found for this basename: CKTOTAL:3,CKMB:3,CKMBINDEX:3,TROPONINI:3 in the last 168 hours BNP: No results found for this basename: PROBNP:3 in the last 168 hours D-Dimer: No results found for this basename: DDIMER:2 in the last 168 hours CBG:  Lab 05/06/11 1109 05/06/11 0734 05/05/11 2111 05/05/11 1615 05/05/11 1141 05/05/11 0800  GLUCAP 305* 230* 229* 176* 233* 227*   Hemoglobin A1C:  Lab 05/04/11 0529    HGBA1C 7.3*   Micro Results: Recent Results (from the past 240 hour(s))  CULTURE, BLOOD (ROUTINE X 2)     Status: Normal (Preliminary result)   Collection Time   05/03/11  5:25 PM      Component Value Range Status Comment   Specimen Description BLOOD RIGHT WRIST   Final    Special Requests     Final    Value: BOTTLES DRAWN AEROBIC AND ANAEROBIC AEB=12CC ANA=6CC   Culture NO GROWTH 3 DAYS   Final    Report Status PENDING   Incomplete   CULTURE, BLOOD (ROUTINE X 2)     Status: Normal (Preliminary result)   Collection Time   05/03/11  5:27 PM      Component Value Range Status Comment   Specimen Description BLOOD LEFT ANTECUBITAL   Final    Special Requests     Final    Value: BOTTLES DRAWN AEROBIC AND ANAEROBIC AEB=10CC ANA=6CC   Culture NO GROWTH 3 DAYS   Final    Report Status PENDING   Incomplete   MRSA PCR SCREENING     Status: Normal   Collection Time   05/03/11  5:59 PM      Component Value Range Status Comment  MRSA by PCR NEGATIVE  NEGATIVE  Final   URINE CULTURE     Status: Normal   Collection Time   05/03/11  9:15 PM      Component Value Range Status Comment   Specimen Description URINE, CLEAN CATCH   Final    Special Requests NONE   Final    Setup Time [id number]   Final    Colony Count NO GROWTH   Final    Culture NO GROWTH   Final    Report Status 05/05/2011 FINAL   Final    Studies/Results: Ct Angio Chest W/cm &/or Wo Cm  05/05/2011  *RADIOLOGY REPORT*  Clinical Data:  Shortness of breath, fever, chills.  CT ANGIOGRAPHY CHEST WITH CONTRAST  Technique:  Multidetector CT imaging of the chest was performed using the standard protocol during bolus administration of intravenous contrast.  Multiplanar CT image reconstructions including MIPs were obtained to evaluate the vascular anatomy.  Contrast: OMNIPAQUE IOHEXOL 350 MG/ML IV SOLN  Comparison:  Chest x-ray 05/03/2011  Findings:  No filling defects in the pulmonary arteries to suggest pulmonary emboli.  Heart is  normal size.  Aorta is normal caliber. Small scattered mediastinal lymph nodes, borderline in size.  Right paratracheal node measures up to 10 mm.  No axillary or hilar adenopathy.  Extensive bilateral ground-glass opacities throughout the lungs. Many of these have a nodular appearance to the.  Findings are concerning for pneumonia, most likely atypical/viral pneumonia. There are no pleural effusions. Visualized thyroid and chest wall soft tissues unremarkable. Imaging into the upper abdomen shows no acute findings.  No acute bony abnormality.  Review of the MIP images confirms the above findings.  IMPRESSION: Extensive bilateral ground-glass opacities, many of which have a nodular appearance.  Associated borderline mediastinal adenopathy. Findings concerning for pneumonia, likely atypical/viral pneumonia.  No evidence of pulmonary embolus.  Original Report Authenticated By: Cyndie Chime, M.D.   Scheduled Meds:   . albuterol  2.5 mg Nebulization Q4H  . azithromycin  500 mg Oral Daily  . benzonatate  100 mg Oral TID  . buPROPion  75 mg Oral BID  . ceFTAROline (TEFLARO) IV  600 mg Intravenous Q12H  . citalopram  10 mg Oral QODAY  . enoxaparin  40 mg Subcutaneous Q24H  . fluticasone  2 spray Each Nare Daily  . guaiFENesin  600 mg Oral BID  . insulin aspart  0-20 Units Subcutaneous TID WC  . insulin aspart  0-5 Units Subcutaneous QHS  . insulin glargine  20 Units Subcutaneous BID  . ipratropium  0.5 mg Nebulization Q4H  . lisinopril  5 mg Oral Daily  . metFORMIN  500 mg Oral BID WC  . nicotine  21 mg Transdermal Daily  . oseltamivir  75 mg Oral BID  . pantoprazole  40 mg Oral Q1200  . predniSONE  40 mg Oral BID  . DISCONTD: famotidine  20 mg Oral BID  . DISCONTD: methylPREDNISolone (SOLU-MEDROL) injection  80 mg Intravenous Q8H   Continuous Infusions:   . DISCONTD: 0.9 % NaCl with KCl 20 mEq / [name] 50 mL/hr at 05/05/11 1022   PRN Meds:.acetaminophen, ALPRAZolam, alum & mag hydroxide-simeth,  antiseptic oral rinse, chlorpheniramine-HYDROcodone, guaiFENesin-dextromethorphan, ibuprofen, ondansetron (ZOFRAN) IV, ondansetron, oxyCODONE-acetaminophen, traZODone, DISCONTD: acetaminophen Assessment/Plan: Principal Problem:  *Pneumonia Active Problems:  COPD with acute exacerbation  HTN (hypertension)  DM type 2 (diabetes mellitus, type 2)  Depression  Microcytic anemia  GERD (gastroesophageal reflux disease)  Tobacco abuse  Gastric erosions  Sinus  tachycardia  Hyponatremia  Leukocytosis  Morbid obesity  CT of the chest consistent with pneumonia, possibly viral or atypical. Continue Tamiflu. Add azithromycin to better cover atypicals. Continue Ceftaroline for now. Needs to quit smoking and lose weight. Stop Pepcid and start protonix. Change steroids to by mouth. Stop telemetry. Hep-Lock IV.   LOS: 3 days   [name] 05/06/2011, 4:22 PM

## 2011-05-07 LAB — GLUCOSE, CAPILLARY
Glucose-Capillary: 214 mg/dL — ABNORMAL HIGH (ref 70–99)
Glucose-Capillary: 299 mg/dL — ABNORMAL HIGH (ref 70–99)
Glucose-Capillary: 329 mg/dL — ABNORMAL HIGH (ref 70–99)
Glucose-Capillary: 284 mg/dL — ABNORMAL HIGH (ref 70–99)

## 2011-05-07 MED ORDER — ALBUTEROL SULFATE (5 MG/ML) 0.5% IN NEBU
2.5000 mg | INHALATION_SOLUTION | Freq: Four times a day (QID) | RESPIRATORY_TRACT | Status: DC
  Administered 2011-05-07 – 2011-05-08 (×5): 2.5 mg via RESPIRATORY_TRACT
  Filled 2011-05-07 (×5): qty 0.5

## 2011-05-07 MED ORDER — INSULIN GLARGINE 100 UNIT/ML ~~LOC~~ SOLN
25.0000 [IU] | Freq: Two times a day (BID) | SUBCUTANEOUS | Status: DC
  Administered 2011-05-07 – 2011-05-08 (×2): 25 [IU] via SUBCUTANEOUS

## 2011-05-07 NOTE — Progress Notes (Signed)
Subjective: Heartburn improved. Still coughing a lot. Gets winded with exertion.  Objective: Vital signs in last 24 hours: Filed Vitals:   05/07/11 0353 05/07/11 0542 05/07/11 0712 05/07/11 1105  BP:  133/76    Pulse:  82    Temp:  97.4 F (36.3 C)    TempSrc:  Oral    Resp:  20    Height:      Weight:      SpO2: 93% 92% 91% 97%   Weight change:   Intake/Output Summary (Last 24 hours) at 05/07/11 1332 Last data filed at 05/07/11 0700  Gross per 24 hour  Intake   3724 ml  Output      0 ml  Net   3724 ml   Physical Exam: General: Eating lunch. Coughing slightly less than yesterday. Appears slightly brighter than yesterday. Lungs: Less wheeze and rhonchi and congestion today., good air movement. Cardiovascular regular rate rhythm without murmurs gallops rubs Abdomen, obese, soft nontender Extremities no clubbing cyanosis or edema  Lab Results: Basic Metabolic Panel:  Lab 05/05/11 1610 05/04/11 0529  NA 138 134*  K 4.2 4.4  CL 101 97  CO2 29 30  GLUCOSE 241* 259*  BUN 10 9  CREATININE 0.62 0.61  CALCIUM 9.7 9.5  MG -- --  PHOS -- --   Liver Function Tests:  Lab 05/04/11 0529  AST 41*  ALT 36*  ALKPHOS 57  BILITOT 0.3  PROT 7.3  ALBUMIN 3.3*   No results found for this basename: LIPASE:2,AMYLASE:2 in the last 168 hours No results found for this basename: AMMONIA:2 in the last 168 hours CBC:  Lab 05/05/11 0528 05/04/11 0529 05/03/11 1520  WBC 19.4* 4.9 --  NEUTROABS -- -- 6.8  HGB 9.3* 8.8* --  HCT 32.4* 31.4* --  MCV 70.7* 69.8* --  PLT 356 270 --   Cardiac Enzymes: No results found for this basename: CKTOTAL:3,CKMB:3,CKMBINDEX:3,TROPONINI:3 in the last 168 hours BNP: No results found for this basename: PROBNP:3 in the last 168 hours D-Dimer: No results found for this basename: DDIMER:2 in the last 168 hours CBG:  Lab 05/07/11 1146 05/07/11 0742 05/06/11 2149 05/06/11 1628 05/06/11 1109 05/06/11 0734  GLUCAP 299* 214* 354* 157* 305* 230*     Hemoglobin A1C:  Lab 05/04/11 0529  HGBA1C 7.3*   Micro Results: Recent Results (from the past 240 hour(s))  CULTURE, BLOOD (ROUTINE X 2)     Status: Normal (Preliminary result)   Collection Time   05/03/11  5:25 PM      Component Value Range Status Comment   Specimen Description BLOOD RIGHT WRIST   Final    Special Requests     Final    Value: BOTTLES DRAWN AEROBIC AND ANAEROBIC AEB=12CC ANA=6CC   Culture NO GROWTH 4 DAYS   Final    Report Status PENDING   Incomplete   CULTURE, BLOOD (ROUTINE X 2)     Status: Normal (Preliminary result)   Collection Time   05/03/11  5:27 PM      Component Value Range Status Comment   Specimen Description BLOOD LEFT ANTECUBITAL   Final    Special Requests     Final    Value: BOTTLES DRAWN AEROBIC AND ANAEROBIC AEB=10CC ANA=6CC   Culture NO GROWTH 4 DAYS   Final    Report Status PENDING   Incomplete   MRSA PCR SCREENING     Status: Normal   Collection Time   05/03/11  5:59 PM  Component Value Range Status Comment   MRSA by PCR NEGATIVE  NEGATIVE  Final   URINE CULTURE     Status: Normal   Collection Time   05/03/11  9:15 PM      Component Value Range Status Comment   Specimen Description URINE, CLEAN CATCH   Final    Special Requests NONE   Final    Setup Time [id number]   Final    Colony Count NO GROWTH   Final    Culture NO GROWTH   Final    Report Status 05/05/2011 FINAL   Final    Studies/Results: No results found. Scheduled Meds:    . albuterol  2.5 mg Nebulization QID  . antiseptic oral rinse  15 mL Mouth Rinse BID  . azithromycin  500 mg Oral Daily  . benzonatate  100 mg Oral TID  . buPROPion  75 mg Oral BID  . ceFTAROline (TEFLARO) IV  600 mg Intravenous Q12H  . citalopram  10 mg Oral QODAY  . enoxaparin  40 mg Subcutaneous Q24H  . fluticasone  2 spray Each Nare Daily  . guaiFENesin  600 mg Oral BID  . insulin aspart  0-20 Units Subcutaneous TID WC  . insulin aspart  0-5 Units Subcutaneous QHS  . insulin glargine  25  Units Subcutaneous BID  . ipratropium  0.5 mg Nebulization Q4H  . lisinopril  5 mg Oral Daily  . metFORMIN  500 mg Oral BID WC  . nicotine  21 mg Transdermal Daily  . oseltamivir  75 mg Oral BID  . pantoprazole  40 mg Oral Q1200  . predniSONE  40 mg Oral BID  . DISCONTD: albuterol  2.5 mg Nebulization Q4H  . DISCONTD: famotidine  20 mg Oral BID  . DISCONTD: insulin glargine  20 Units Subcutaneous BID  . DISCONTD: methylPREDNISolone (SOLU-MEDROL) injection  80 mg Intravenous Q8H   Continuous Infusions:    . DISCONTD: 0.9 % NaCl with KCl 20 mEq / L 50 mL/hr at 05/05/11 1022   PRN Meds:.acetaminophen, ALPRAZolam, alum & mag hydroxide-simeth, antiseptic oral rinse, chlorpheniramine-HYDROcodone, guaiFENesin-dextromethorphan, ibuprofen, ondansetron (ZOFRAN) IV, ondansetron, oxyCODONE-acetaminophen, traZODone, DISCONTD: acetaminophen Assessment/Plan: Principal Problem:  *Pneumonia Active Problems:  COPD with acute exacerbation  HTN (hypertension)  DM type 2 (diabetes mellitus, type 2)  Depression  Microcytic anemia  GERD (gastroesophageal reflux disease)  Tobacco abuse  Gastric erosions  Sinus tachycardia  Hyponatremia  Leukocytosis  Morbid obesity  Increase activity. Hopefully home in a day or 2.   LOS: 4 days   Telia Amundson L 05/07/2011, 1:32 PM

## 2011-05-08 LAB — GLUCOSE, CAPILLARY
Glucose-Capillary: 213 mg/dL — ABNORMAL HIGH (ref 70–99)
Glucose-Capillary: 187 mg/dL — ABNORMAL HIGH (ref 70–99)

## 2011-05-08 MED ORDER — OXYCODONE-ACETAMINOPHEN 5-325 MG PO TABS: 1.0000 | ORAL_TABLET | Freq: Four times a day (QID) | ORAL | Status: DC | PRN

## 2011-05-08 MED ORDER — AZITHROMYCIN 250 MG PO TABS: 250.0000 mg | ORAL_TABLET | Freq: Every day | ORAL | Status: DC

## 2011-05-08 MED ORDER — BUPROPION HCL ER (SR) 150 MG PO TB12: 150.0000 mg | ORAL_TABLET | Freq: Two times a day (BID) | ORAL | Status: DC

## 2011-05-08 MED ORDER — SODIUM CHLORIDE 0.9 % IJ SOLN
INTRAMUSCULAR | Status: AC
  Filled 2011-05-08: qty 3

## 2011-05-08 MED ORDER — SODIUM CHLORIDE 0.9 % IJ SOLN
INTRAMUSCULAR | Status: AC
Start: 2011-05-08 — End: 2011-05-08
  Administered 2011-05-08: 10 mL
  Filled 2011-05-08: qty 3

## 2011-05-08 MED ORDER — PREDNISONE (PAK) 10 MG PO TABS: 10.0000 mg | ORAL_TABLET | Freq: Every day | ORAL | Status: AC

## 2011-05-08 NOTE — Progress Notes (Signed)
05/08/11 1634 patient being discharged home. Reviewed discharge instructions with patient, given copy of instructions, med list, prescriptions, f/u appointment information,. Stated she understood instructions. No c/o pain or discomfort prior to discharge. IV site d/c'd and within normal limits. Pt left floor in stable condition via w/c accompanied by nurse tech.

## 2011-06-02 NOTE — Discharge Summary (Signed)
Physician Discharge Summary  Patient ID: ISLAND DOHMEN MRN: [id number] DOB/AGE: [birth year] 46 y.o.  Admit date: 05/03/2011 Discharge date: 06/02/2011  Discharge Diagnoses:  Principal Problem:  *Pneumonia Active Problems:  COPD with acute exacerbation  HTN (hypertension)  DM type 2 (diabetes mellitus, type 2)  Depression  Microcytic anemia  GERD (gastroesophageal reflux disease)  Tobacco abuse  Gastric erosions  Sinus tachycardia  Hyponatremia  Leukocytosis  Morbid obesity   Medication List  As of 06/02/2011 10:52 AM   TAKE these medications         albuterol 108 (90 BASE) MCG/ACT inhaler   Commonly known as: PROVENTIL HFA;VENTOLIN HFA   Inhale 2 puffs into the lungs every 4 (four) hours as needed for wheezing or shortness of breath. Wheezing/shortnes of breath      azithromycin 250 MG tablet   Commonly known as: ZITHROMAX   Take 1 tablet (250 mg total) by mouth daily.      buPROPion 150 MG 12 hr tablet   Commonly known as: WELLBUTRIN SR   Take 1 tablet (150 mg total) by mouth 2 (two) times daily.      citalopram 10 MG tablet   Commonly known as: CELEXA   Take 1 tablet (10 mg total) by mouth daily.      fluticasone 50 MCG/ACT nasal spray   Commonly known as: FLONASE   Place 2 sprays into the nose daily.      ibuprofen 200 MG tablet   Commonly known as: ADVIL,MOTRIN   Take 200-400 mg by mouth daily as needed. For pain      lisinopril 5 MG tablet   Commonly known as: PRINIVIL,ZESTRIL   Take 5 mg by mouth daily.      metFORMIN 500 MG tablet   Commonly known as: GLUCOPHAGE   Take 500 mg by mouth 2 (two) times daily with a meal.      omeprazole 20 MG capsule   Commonly known as: PRILOSEC   Take 20 mg by mouth daily.            Discharge Orders    Future Appointments: Provider: Department: Dept Phone: Center:   06/09/2011 10:15 AM Milinda Antis, MD [facility] [phone] [facility]     Future Orders Please Complete By Expires   Diet Carb Modified       Discharge instructions      Comments:   Quit smoking   Activity as tolerated - No restrictions         Follow-up Information    Follow up with Vertis Kelch, NP. (As needed if symptoms worsen)          Disposition: Home or Self Care  Discharged Condition: stable  Consults:  none  Labs:     Sample type     ARTERIAL          Delivery systems     NASAL CANNULA          O2 Content     2.0          pH, Arterial     7.404          pCO2 arterial     42.4          pO2, Arterial     68.3          Bicarbonate     25.9          TCO2     24.0  Acid-Base Excess     1.7          O2 Saturation     93.2          Patient temperature     37.0          Collection site     LEFT RADIAL          Allens test (pass/fail)     PASS          CHEM PROFILE   Sodium     134 138         Potassium     4.4 4.2         Chloride     97 101         CO2     30 29         Mean Plasma Glucose     163          BUN     9 10         Creatinine, Ser     0.61 0.62         Calcium     9.5 9.7         GFR calc non Af Amer     >90 >90         GFR calc Af Amer     >90 >90         Glucose, Bld     259 241         Alkaline Phosphatase     57          Albumin     3.3          AST     41          ALT     36          Total Protein     7.3          Total Bilirubin     0.3          PROTEIN ELP   Total Protein     7.3          CBC   WBC     4.9 19.4         RBC     4.50 4.58         Hemoglobin     8.8 9.3         HCT     31.4 32.4         MCV     69.8 70.7         MCH     19.6 20.3         MCHC     28.0 28.7         RDW     18.8 19.1         Platelets     270 356         DIFFERENTIAL   Neutrophils Relative     73          Lymphocytes Relative     16          Monocytes Relative     11          Eosinophils Relative     0          Basophils Relative     0          Neutro Abs  6.8          Lymphs Abs     1.5          Monocytes Absolute     1.0          Eosinophils Absolute     0.0           Basophils Absolute     0.0          RBC Morphology     POLYCHROMASIA PRESENT          WBC Morphology     ATYPICAL MONONUCLEAR CELLS          Smear Review     LARGE PLATELETS PRESENT          DIABETES   Hemoglobin A1C     7.3          Glucose-Capillary     219 303 229 354 284 187     Glucose, Bld     259 241         URINALYSIS   Color, Urine     AMBER          APPearance     CLEAR          Specific Gravity, Urine     <1.005          pH     6.0          Glucose, UA     NEGATIVE          Bilirubin Urine     NEGATIVE          Ketones, ur     TRACE          Protein, ur     NEGATIVE          Urobilinogen, UA     0.2          Nitrite     NEGATIVE          Leukocytes, UA     NEGATIVE          WBC, UA     3-6          RBC / HPF     0-2          Squamous Epithelial / LPF     MANY          Bacteria, UA     FEW          URINE CHEMISTRY   Legionella Antigen, Urine     Negative for Legionella pneumophilia serogroup 1          URINE, OTHER   Strep Pneumo Urinary Antigen     NEGATIVE          MICROBIOLOGY   CULTURE, BLOOD (ROUTINE X 2)               MRSA by PCR     NEGATIVE          URINE CULTURE               URINE DIPSTICK   Hgb urine dipstick     SMALL       Diagnostics:  Dg Chest 2 View  05/03/2011  *RADIOLOGY REPORT*  Clinical Data: Chest pain and cough.  CHEST - 2 VIEW  Comparison: Chest 03/26/2011.  Findings: There is a rounded focus of airspace opacity in the right mid lung seen on the PA view only.  Left lung is clear. Peribronchial thickening is noted.  No pneumothorax or pleural effusion.  Heart size is normal.  IMPRESSION:  1.  Focal, rounded area of airspace opacity in the right mid lung is not definitely seen on prior studies and may be due to infectious or inflammatory process.  Recommend follow-up plain films in 4-6 weeks to ensure clearing. 2.  Bronchitic change.  Original Report Authenticated By: Bernadene Bell. Maricela Curet, M.D.   Ct Angio Chest W/cm &/or Wo Cm  05/05/2011   *RADIOLOGY REPORT*  Clinical Data:  Shortness of breath, fever, chills.  CT ANGIOGRAPHY CHEST WITH CONTRAST  Technique:  Multidetector CT imaging of the chest was performed using the standard protocol during bolus administration of intravenous contrast.  Multiplanar CT image reconstructions including MIPs were obtained to evaluate the vascular anatomy.  Contrast: OMNIPAQUE IOHEXOL 350 MG/ML IV SOLN  Comparison:  Chest x-ray 05/03/2011  Findings:  No filling defects in the pulmonary arteries to suggest pulmonary emboli.  Heart is normal size.  Aorta is normal caliber. Small scattered mediastinal lymph nodes, borderline in size.  Right paratracheal node measures up to 10 mm.  No axillary or hilar adenopathy.  Extensive bilateral ground-glass opacities throughout the lungs. Many of these have a nodular appearance to the.  Findings are concerning for pneumonia, most likely atypical/viral pneumonia. There are no pleural effusions. Visualized thyroid and chest wall soft tissues unremarkable. Imaging into the upper abdomen shows no acute findings.  No acute bony abnormality.  Review of the MIP images confirms the above findings.  IMPRESSION: Extensive bilateral ground-glass opacities, many of which have a nodular appearance.  Associated borderline mediastinal adenopathy. Findings concerning for pneumonia, likely atypical/viral pneumonia.  No evidence of pulmonary embolus.  Original Report Authenticated By: Cyndie Chime, M.D.    Hospital Course: See H&P for complete admission details. The patient is a 46 year old white female smoker who presents with shortness of breath, cough, fever, chills, myalgia and chest tightness. She has a history of COPD. She had tachycardia, mild tachypnea and low-grade fever on admission. She had bilateral wheezing on exam. Chest x-ray showed pneumonia. CT scan of the chest showed pneumonia, atypical versus viral. Patient on admission was started on empiric therapy for influenza and  broad-spectrum antibiotics. She improved slowly but surely. She is encouraged to quit smoking. She did have some heartburn during the hospitalization but otherwise was medically stable. By the time of discharge, her lung sounds, vital signs, symptoms had improved. Total time on the day of discharge greater than 30 minutes.  Discharge Exam:  Blood pressure 118/69, pulse 93, temperature 97.6 F (36.4 C), temperature source Oral, resp. rate 21, height 5\' 4"  (1.626 m), weight 124.5 kg (274 lb 7.6 oz), last menstrual period 04/26/2011, SpO2 96.00%.  Lungs: Good air movement. Some wheeze  Signed: Rayan Ines L 06/02/2011, 10:52 AM

## 2011-06-09 ENCOUNTER — Ambulatory Visit (INDEPENDENT_AMBULATORY_CARE_PROVIDER_SITE_OTHER): Payer: Medicaid Other | Admitting: Family Medicine

## 2011-06-09 ENCOUNTER — Encounter: Payer: Self-pay | Admitting: Family Medicine

## 2011-06-09 VITALS — BP 130/74 | HR 120 | Resp 18 | Ht 64.0 in | Wt 274.0 lb

## 2011-06-09 DIAGNOSIS — M549 Dorsalgia, unspecified: Secondary | ICD-10-CM

## 2011-06-09 DIAGNOSIS — F32A Depression, unspecified: Secondary | ICD-10-CM

## 2011-06-09 DIAGNOSIS — R000 Tachycardia, unspecified: Secondary | ICD-10-CM

## 2011-06-09 DIAGNOSIS — I498 Other specified cardiac arrhythmias: Secondary | ICD-10-CM

## 2011-06-09 DIAGNOSIS — R0902 Hypoxemia: Secondary | ICD-10-CM

## 2011-06-09 DIAGNOSIS — H60399 Other infective otitis externa, unspecified ear: Secondary | ICD-10-CM

## 2011-06-09 DIAGNOSIS — H6090 Unspecified otitis externa, unspecified ear: Secondary | ICD-10-CM

## 2011-06-09 DIAGNOSIS — F3289 Other specified depressive episodes: Secondary | ICD-10-CM

## 2011-06-09 DIAGNOSIS — E119 Type 2 diabetes mellitus without complications: Secondary | ICD-10-CM

## 2011-06-09 DIAGNOSIS — Z720 Tobacco use: Secondary | ICD-10-CM

## 2011-06-09 DIAGNOSIS — I10 Essential (primary) hypertension: Secondary | ICD-10-CM

## 2011-06-09 DIAGNOSIS — F329 Major depressive disorder, single episode, unspecified: Secondary | ICD-10-CM

## 2011-06-09 DIAGNOSIS — J449 Chronic obstructive pulmonary disease, unspecified: Secondary | ICD-10-CM

## 2011-06-09 DIAGNOSIS — F17200 Nicotine dependence, unspecified, uncomplicated: Secondary | ICD-10-CM

## 2011-06-09 MED ORDER — METFORMIN HCL 500 MG PO TABS: 500.0000 mg | ORAL_TABLET | Freq: Two times a day (BID) | ORAL | Status: DC

## 2011-06-09 MED ORDER — OFLOXACIN 0.3 % OT SOLN: 5.0000 [drp] | Freq: Two times a day (BID) | OTIC | Status: AC

## 2011-06-09 MED ORDER — OXYCODONE-ACETAMINOPHEN 5-325 MG PO TABS: 1.0000 | ORAL_TABLET | Freq: Two times a day (BID) | ORAL | Status: DC | PRN

## 2011-06-09 MED ORDER — CITALOPRAM HYDROBROMIDE 20 MG PO TABS: 20.0000 mg | ORAL_TABLET | Freq: Every day | ORAL | Status: DC

## 2011-06-09 MED ORDER — GLUCOSE BLOOD VI STRP: ORAL_STRIP | Status: DC

## 2011-06-09 MED ORDER — FLUTICASONE-SALMETEROL 250-50 MCG/DOSE IN AEPB: 1.0000 | INHALATION_SPRAY | Freq: Two times a day (BID) | RESPIRATORY_TRACT | Status: DC

## 2011-06-09 MED ORDER — LISINOPRIL 5 MG PO TABS: 5.0000 mg | ORAL_TABLET | Freq: Every day | ORAL | Status: DC

## 2011-06-09 MED ORDER — FLUTICASONE PROPIONATE 50 MCG/ACT NA SUSP: 2.0000 | Freq: Every day | NASAL | Status: DC

## 2011-06-09 NOTE — Patient Instructions (Signed)
Use the inhaler for your COPD- Advair For your diabetes- take your blood sugar fasting every morning and record or bring your meter Use the pain medication twice a day as needed Continue to work on the smoking! Use the ear drops Continue your other medications I will review your records F/U 1 month

## 2011-06-11 ENCOUNTER — Encounter: Payer: Self-pay | Admitting: Family Medicine

## 2011-06-11 DIAGNOSIS — H6090 Unspecified otitis externa, unspecified ear: Secondary | ICD-10-CM | POA: Insufficient documentation

## 2011-06-11 DIAGNOSIS — M549 Dorsalgia, unspecified: Secondary | ICD-10-CM | POA: Insufficient documentation

## 2011-06-11 DIAGNOSIS — J45909 Unspecified asthma, uncomplicated: Secondary | ICD-10-CM | POA: Insufficient documentation

## 2011-06-11 DIAGNOSIS — M545 Low back pain, unspecified: Secondary | ICD-10-CM | POA: Insufficient documentation

## 2011-06-11 NOTE — Assessment & Plan Note (Signed)
Noted  

## 2011-06-11 NOTE — Assessment & Plan Note (Signed)
floroquinolone ear drops

## 2011-06-11 NOTE — Assessment & Plan Note (Signed)
Continue lisinopril  Goal < 130/80

## 2011-06-11 NOTE — Assessment & Plan Note (Signed)
Continue advair and albuterol, she would benefit from spiriva, cost is an issue. Home oxygen

## 2011-06-11 NOTE — Assessment & Plan Note (Signed)
Continue oxygen supplementation  

## 2011-06-11 NOTE — Assessment & Plan Note (Signed)
Continue metformin, last A1C suboptimal, goal < 5, difficult with use of steroids.

## 2011-06-11 NOTE — Assessment & Plan Note (Signed)
Pt cutting back, encouraged cessation

## 2011-06-11 NOTE — Assessment & Plan Note (Signed)
30 days of pain medication given,she is high risk for osteoporosis and fracture with sterid use

## 2011-06-11 NOTE — Assessment & Plan Note (Signed)
Continue celexa

## 2011-06-11 NOTE — Progress Notes (Signed)
  Subjective:    Patient ID: [name], female    DOB: 24-Mar-1966, 46 y.o.   MRN: [id number]  HPI Pt here to establish care, last PCP [facility] Medications and history reviewed  DM- currently on Metformin, last A1C 7.3% at recent hospitalization. Fastings are 140's. Has a prodigy meter and needs refills   Depression- longstanding history of depression, currently on celexa 20mg . Has been on prozac, wellbutrin and trazadone.   COPD- diagnosed with COPD 3-4 years ago. Currently on oxygen for recurrent admissions and hypoxia, has never seen pulmonary. Smokes 1/2ppd, previously 2ppd  Ear drainage- noticed right ear drainage past week, has had difficulty with hear in the past, history of chronic perforation and recurrent otitis.  Chronic back pain- lumbar region, xray showed mild disc osteophytes in Dec. States she has had elevated RF factor in the past but unable to afford rheumatology, has other joint pains as well. Was on percocet given by the hospital  Abdominal pain- evaluated for chronic abd pain- treated for GERD and gastritis by GI after endoscopy  Microcytic anemia- does not take iron tablets  Review of Systems GEN- denies fatigue, fever, weight loss,weakness, recent illness HEENT- denies eye drainage, change in vision, nasal discharge, CVS- occ chest pain, palpitations RESP- + SOB, +cough, +wheeze ABD- denies N/V, change in stools, abd pain GU- denies dysuria, hematuria, dribbling, incontinence MSK- + joint pain, +muscle aches, injury Neuro- denies headache, +dizziness, syncope, seizure activity      Objective:   Physical Exam GEN- NAD, alert and oriented x3, morbidly obese HEENT- PERRL, EOMI, non injected sclera, pink conjunctiva, MMM, oropharynx clear, right ear perforated TM, erythema and mild swelling of canal with draiange Neck- Supple, no thryomegaly CVS- tachycardia, no murmur RESP-bilateral wheezing, normal WOB, no rhonchi,  EXT- No edema Pulses- Radial, DP-  2+        Assessment & Plan:

## 2011-06-12 ENCOUNTER — Other Ambulatory Visit: Payer: Self-pay

## 2011-06-12 MED ORDER — ACCU-CHEK MULTICLIX LANCETS MISC: Status: DC

## 2011-06-12 MED ORDER — GLUCOSE BLOOD VI STRP: ORAL_STRIP | Status: DC

## 2011-06-12 MED ORDER — ACCU-CHEK AVIVA PLUS W/DEVICE KIT: 1.0000 | PACK | Freq: Every day | Status: DC

## 2011-06-13 ENCOUNTER — Telehealth: Payer: Self-pay | Admitting: Nurse Practitioner

## 2011-06-13 MED ORDER — OMEPRAZOLE 20 MG PO CPDR: 20.0000 mg | DELAYED_RELEASE_CAPSULE | Freq: Every day | ORAL | Status: DC

## 2011-06-13 NOTE — Telephone Encounter (Signed)
She should continue ear drops, she can come in for a recheck on her ear to see if she needs to be seen by ENT, sometime this week. You can refill prilosec

## 2011-06-13 NOTE — Telephone Encounter (Signed)
Please advise.  Ok for me to refill prilosec?

## 2011-06-14 NOTE — Telephone Encounter (Signed)
Called patient and left message for them to return call at the office   

## 2011-06-15 NOTE — Progress Notes (Signed)
Patient ID: [name], female   DOB: [birth year], 46 y.o.   MRN: [id number] Records received from health department.

## 2011-07-06 ENCOUNTER — Encounter: Payer: Self-pay | Admitting: Family Medicine

## 2011-07-06 ENCOUNTER — Ambulatory Visit (INDEPENDENT_AMBULATORY_CARE_PROVIDER_SITE_OTHER): Payer: Medicaid Other | Admitting: Family Medicine

## 2011-07-06 VITALS — BP 142/74 | HR 115 | Resp 18 | Ht 64.0 in | Wt 273.0 lb

## 2011-07-06 DIAGNOSIS — Z13820 Encounter for screening for osteoporosis: Secondary | ICD-10-CM

## 2011-07-06 DIAGNOSIS — E119 Type 2 diabetes mellitus without complications: Secondary | ICD-10-CM

## 2011-07-06 DIAGNOSIS — M549 Dorsalgia, unspecified: Secondary | ICD-10-CM

## 2011-07-06 DIAGNOSIS — E559 Vitamin D deficiency, unspecified: Secondary | ICD-10-CM | POA: Insufficient documentation

## 2011-07-06 DIAGNOSIS — Z720 Tobacco use: Secondary | ICD-10-CM

## 2011-07-06 DIAGNOSIS — J449 Chronic obstructive pulmonary disease, unspecified: Secondary | ICD-10-CM

## 2011-07-06 DIAGNOSIS — G629 Polyneuropathy, unspecified: Secondary | ICD-10-CM

## 2011-07-06 DIAGNOSIS — M2550 Pain in unspecified joint: Secondary | ICD-10-CM

## 2011-07-06 DIAGNOSIS — G609 Hereditary and idiopathic neuropathy, unspecified: Secondary | ICD-10-CM

## 2011-07-06 DIAGNOSIS — F17200 Nicotine dependence, unspecified, uncomplicated: Secondary | ICD-10-CM

## 2011-07-06 MED ORDER — OXYCODONE-ACETAMINOPHEN 5-325 MG PO TABS: 1.0000 | ORAL_TABLET | Freq: Two times a day (BID) | ORAL | Status: DC | PRN

## 2011-07-06 MED ORDER — GABAPENTIN 300 MG PO CAPS: 300.0000 mg | ORAL_CAPSULE | Freq: Every day | ORAL | Status: DC

## 2011-07-06 NOTE — Patient Instructions (Signed)
Continue your current medications  We will call with lab results and Dexa scan results Do not eat after midnight I will put you on a pain contract Start the neurontin at bedtime for the neuropathy F/U in 3 months

## 2011-07-07 ENCOUNTER — Encounter: Payer: Self-pay | Admitting: Family Medicine

## 2011-07-07 DIAGNOSIS — G629 Polyneuropathy, unspecified: Secondary | ICD-10-CM | POA: Insufficient documentation

## 2011-07-07 NOTE — Assessment & Plan Note (Signed)
Placed on pain contract. She does have some degenerative disc disease. Percocet refilled.

## 2011-07-07 NOTE — Assessment & Plan Note (Signed)
Was give trial of Neurontin at bedtime titrate medication as needed

## 2011-07-07 NOTE — Assessment & Plan Note (Addendum)
Obtain anti-inflammatory markers and rheumatoid factor.   Labs reviewed, elevated CRP and RF, will send for evaluation

## 2011-07-07 NOTE — Assessment & Plan Note (Signed)
Currently stable. Patient about to turn down her oxygen at 2.5 L. She has COPD her O2 sat did not need to be 100%.

## 2011-07-07 NOTE — Assessment & Plan Note (Signed)
Continue metformin.

## 2011-07-07 NOTE — Progress Notes (Signed)
  Subjective:    Patient ID: [name], female    DOB: April 18, 1966, 46 y.o.   MRN: [id number]  HPI COPD- diagnosed with COPD 3-4 years ago. Continues to smoke, is able to move about the house at times without oxygen, asked if she could turn it down to 2.5 L, because her pulse ox has been in upper 90's,during the day. Advair has helped a lot '  Chronic back pain- lumbar region, xray showed mild disc osteophytes in Dec. Would like pain medications refilled  Joint pain- multiple joints involved, history of positive RF in the past, has pain in hands, wrist, shoulders, knees  Due for Dexa Scan  DM- CBG 140's in AM, 200's after meals, +burning and tingling in hands and feet, feet sensitive causes pain when walking and at night  Review of Systems  GEN- denies fatigue, fever, weight loss,weakness, recent illness HEENT- denies eye drainage, change in vision, nasal discharge, CVS- occ chest pain, palpitations RESP- denies SOB, +cough, +wheeze ABD- denies N/V, change in stools, abd pain GU- denies dysuria, hematuria, dribbling, incontinence MSK- + joint pain, +muscle aches, injury Neuro- denies headache, dizziness, syncope, seizure activity      Objective:   Physical Exam GEN- NAD, alert and oriented x3, morbidly obese CVS- tachycardia, no murmur RESP-mostly clear with mild scattered coarse BS  EXT- No edema Pulses- Radial, DP- 2+ Hands- no swan deformity, no joint swelling noted in hands       Assessment & Plan:

## 2011-07-07 NOTE — Assessment & Plan Note (Signed)
Continue to encourage cessation. I do not think patient is ready to quit altogether

## 2011-07-08 LAB — LIPID PANEL
Cholesterol: 154 mg/dL (ref 0–200)
HDL: 36 mg/dL — ABNORMAL LOW (ref >39)
LDL Cholesterol: 101 mg/dL — ABNORMAL HIGH (ref 0–99)
Total CHOL/HDL Ratio: 4.3 Ratio
Triglycerides: 84 mg/dL (ref <150)
VLDL: 17 mg/dL (ref 0–40)

## 2011-07-08 LAB — HEMOGLOBIN A1C
Hgb A1c MFr Bld: 6.4 % — ABNORMAL HIGH (ref <5.7)
Mean Plasma Glucose: 137 mg/dL — ABNORMAL HIGH (ref <117)

## 2011-07-08 LAB — RHEUMATOID FACTOR: Rhuematoid fact SerPl-aCnc: 81 IU/mL — ABNORMAL HIGH (ref <=14)

## 2011-07-08 LAB — C-REACTIVE PROTEIN: CRP: 2.24 mg/dL — ABNORMAL HIGH (ref <0.60)

## 2011-07-08 LAB — SEDIMENTATION RATE: Sed Rate: 11 mm/hr (ref 0–22)

## 2011-07-09 NOTE — Progress Notes (Signed)
Addended by: Milinda Antis F on: 07/09/2011 12:03 PM   Modules accepted: Orders

## 2011-07-12 LAB — VITAMIN D 1,25 DIHYDROXY
Vitamin D 1, 25 (OH)2 Total: 53 pg/mL (ref 18–72)
Vitamin D2 1, 25 (OH)2: <8 pg/mL
Vitamin D3 1, 25 (OH)2: 53 pg/mL

## 2011-07-13 ENCOUNTER — Ambulatory Visit (HOSPITAL_COMMUNITY)
Admission: RE | Admit: 2011-07-13 | Discharge: 2011-07-13 | Disposition: A | Discharge status: Home / Self Care | Payer: Medicaid Other | Source: Ambulatory Visit | Attending: Family Medicine | Admitting: Family Medicine

## 2011-07-13 DIAGNOSIS — F17200 Nicotine dependence, unspecified, uncomplicated: Secondary | ICD-10-CM | POA: Insufficient documentation

## 2011-07-13 DIAGNOSIS — Z13820 Encounter for screening for osteoporosis: Secondary | ICD-10-CM

## 2011-07-27 ENCOUNTER — Telehealth: Payer: Self-pay | Admitting: Family Medicine

## 2011-07-28 NOTE — Telephone Encounter (Signed)
She is on a pain contract, no early refills, she had medication prescribed  07/06/11, she has to wait

## 2011-07-28 NOTE — Telephone Encounter (Signed)
Pt states that she has taken 3 of her oxycodone on some days and now is out.  She is requesting an early refill.

## 2011-07-28 NOTE — Telephone Encounter (Signed)
Pt aware.

## 2011-08-03 ENCOUNTER — Telehealth: Payer: Self-pay | Admitting: Family Medicine

## 2011-08-03 MED ORDER — OXYCODONE-ACETAMINOPHEN 5-325 MG PO TABS: 1.0000 | ORAL_TABLET | Freq: Two times a day (BID) | ORAL | Status: DC | PRN

## 2011-08-03 NOTE — Telephone Encounter (Signed)
She can pick up tomorrow.

## 2011-08-03 NOTE — Telephone Encounter (Signed)
When can she collect the script?

## 2011-08-17 ENCOUNTER — Other Ambulatory Visit: Payer: Self-pay | Admitting: Internal Medicine

## 2011-08-31 ENCOUNTER — Telehealth: Payer: Self-pay | Admitting: Family Medicine

## 2011-08-31 ENCOUNTER — Other Ambulatory Visit: Payer: Self-pay

## 2011-08-31 MED ORDER — OXYCODONE-ACETAMINOPHEN 5-325 MG PO TABS: 1.0000 | ORAL_TABLET | Freq: Two times a day (BID) | ORAL | Status: DC | PRN

## 2011-08-31 MED ORDER — CITALOPRAM HYDROBROMIDE 20 MG PO TABS: 20.0000 mg | ORAL_TABLET | Freq: Every day | ORAL | Status: DC

## 2011-08-31 NOTE — Telephone Encounter (Signed)
Pt aware rx is ready 

## 2011-09-29 ENCOUNTER — Other Ambulatory Visit: Payer: Self-pay

## 2011-09-29 ENCOUNTER — Other Ambulatory Visit: Payer: Self-pay | Admitting: Family Medicine

## 2011-09-29 ENCOUNTER — Telehealth: Payer: Self-pay | Admitting: Family Medicine

## 2011-09-29 MED ORDER — OXYCODONE-ACETAMINOPHEN 5-325 MG PO TABS: 1.0000 | ORAL_TABLET | Freq: Two times a day (BID) | ORAL | Status: DC | PRN

## 2011-09-29 NOTE — Telephone Encounter (Signed)
Med awaiting signature. 

## 2011-09-29 NOTE — Telephone Encounter (Signed)
Pt to pickup rx this afternoon.

## 2011-10-06 ENCOUNTER — Ambulatory Visit (INDEPENDENT_AMBULATORY_CARE_PROVIDER_SITE_OTHER): Payer: Self-pay | Admitting: Family Medicine

## 2011-10-06 ENCOUNTER — Encounter: Payer: Self-pay | Admitting: Family Medicine

## 2011-10-06 VITALS — BP 140/72 | HR 90 | Resp 18 | Ht 64.0 in | Wt 252.1 lb

## 2011-10-06 DIAGNOSIS — M549 Dorsalgia, unspecified: Secondary | ICD-10-CM

## 2011-10-06 DIAGNOSIS — G629 Polyneuropathy, unspecified: Secondary | ICD-10-CM

## 2011-10-06 DIAGNOSIS — K219 Gastro-esophageal reflux disease without esophagitis: Secondary | ICD-10-CM

## 2011-10-06 DIAGNOSIS — Z720 Tobacco use: Secondary | ICD-10-CM

## 2011-10-06 DIAGNOSIS — G609 Hereditary and idiopathic neuropathy, unspecified: Secondary | ICD-10-CM

## 2011-10-06 DIAGNOSIS — E119 Type 2 diabetes mellitus without complications: Secondary | ICD-10-CM

## 2011-10-06 DIAGNOSIS — I10 Essential (primary) hypertension: Secondary | ICD-10-CM

## 2011-10-06 DIAGNOSIS — F17200 Nicotine dependence, unspecified, uncomplicated: Secondary | ICD-10-CM

## 2011-10-06 DIAGNOSIS — J449 Chronic obstructive pulmonary disease, unspecified: Secondary | ICD-10-CM

## 2011-10-06 NOTE — Progress Notes (Signed)
  Subjective:    Patient ID: [name], female    DOB: [birth year], 46 y.o.   MRN: [id number]  HPI Patient presents for routine followup. She has no concerns today. Medications reviewed. She has lost her Medicaid therefore is self-pay at this time. Her oxygen is supplied through program with [place]. She does not want to go back to the health department because they would not treat her chronic pain. She is able to get most of her medications including her inhalers. On some instances she does only take Advair once a day in order to make this medication last. She has improved with her activities with the narcotic medication. She has lost 20 pounds in the past few months she has been doing this intentionally with her family members. Blood sugars fasting are typically less than 200.  Review of Systems   GEN- denies fatigue, fever, weight loss,weakness, recent illness HEENT- denies eye drainage, change in vision, nasal discharge, CVS- denies chest pain, palpitations RESP- denies SOB, cough, wheeze ABD- denies N/V, change in stools, abd pain GU- denies dysuria, hematuria, dribbling, incontinence MSK- + joint pain, muscle aches, injury Neuro- denies headache, dizziness, syncope, seizure activity      Objective:   Physical Exam GEN- NAD, alert and oriented x3, noted weight loss HEENT- PERRL, EOMI, non injected sclera, pink conjunctiva, MMM, oropharynx clear Neck- Supple,  CVS- RRR, no murmur RESP-clear with occasional scattered wheeze,normal WOB, on oxygen 2.5L  ABD-NABS,soft, NT,ND  EXT- No edema Pulses- Radial, DP- 2+ Feet- no open lesions        Assessment & Plan:

## 2011-10-06 NOTE — Patient Instructions (Addendum)
Continue current medication Lisinopril will be increased to 10mg   We will mail your form  I will get labs at the next visit  F/U 3 months

## 2011-10-07 NOTE — Assessment & Plan Note (Signed)
Continue oxygen, and advair, we discussed medications she states she is able to afford them currently

## 2011-10-07 NOTE — Assessment & Plan Note (Signed)
Stable on OTC prilosec. 

## 2011-10-07 NOTE — Assessment & Plan Note (Signed)
Pt has discontinued neurontin, she will likley unable to afford, consider amitryptiline

## 2011-10-07 NOTE — Assessment & Plan Note (Signed)
On pain contract, continue percocet  

## 2011-10-07 NOTE — Assessment & Plan Note (Signed)
Smoking cessation discussed 

## 2011-10-07 NOTE — Assessment & Plan Note (Signed)
Congratulated on weight loss 

## 2011-10-07 NOTE — Assessment & Plan Note (Signed)
Previously well controlled, she is unable to afford labs today, last A1C 3 months ago, if she continues to follow with me, will need labs at next visit, continue metformin

## 2011-10-07 NOTE — Assessment & Plan Note (Signed)
Suboptimal, increase lisinopril to 10mg 

## 2011-10-16 ENCOUNTER — Inpatient Hospital Stay (HOSPITAL_COMMUNITY)
Admission: EM | Admit: 2011-10-16 | Discharge: 2011-10-25 | DRG: 193 | Disposition: A | Discharge status: Home / Self Care | Payer: Medicaid Other | Attending: Internal Medicine | Admitting: Internal Medicine

## 2011-10-16 ENCOUNTER — Emergency Department (HOSPITAL_COMMUNITY): Payer: Medicaid Other

## 2011-10-16 ENCOUNTER — Encounter (HOSPITAL_COMMUNITY): Payer: Self-pay | Admitting: *Deleted

## 2011-10-16 DIAGNOSIS — J45909 Unspecified asthma, uncomplicated: Secondary | ICD-10-CM | POA: Diagnosis present

## 2011-10-16 DIAGNOSIS — Z6841 Body Mass Index (BMI) 40.0 and over, adult: Secondary | ICD-10-CM

## 2011-10-16 DIAGNOSIS — E119 Type 2 diabetes mellitus without complications: Secondary | ICD-10-CM | POA: Diagnosis present

## 2011-10-16 DIAGNOSIS — J9620 Acute and chronic respiratory failure, unspecified whether with hypoxia or hypercapnia: Secondary | ICD-10-CM | POA: Diagnosis not present

## 2011-10-16 DIAGNOSIS — E876 Hypokalemia: Secondary | ICD-10-CM

## 2011-10-16 DIAGNOSIS — E1159 Type 2 diabetes mellitus with other circulatory complications: Secondary | ICD-10-CM | POA: Diagnosis present

## 2011-10-16 DIAGNOSIS — J9610 Chronic respiratory failure, unspecified whether with hypoxia or hypercapnia: Secondary | ICD-10-CM

## 2011-10-16 DIAGNOSIS — J159 Unspecified bacterial pneumonia: Secondary | ICD-10-CM

## 2011-10-16 DIAGNOSIS — F17200 Nicotine dependence, unspecified, uncomplicated: Secondary | ICD-10-CM | POA: Diagnosis present

## 2011-10-16 DIAGNOSIS — Z9981 Dependence on supplemental oxygen: Secondary | ICD-10-CM

## 2011-10-16 DIAGNOSIS — D509 Iron deficiency anemia, unspecified: Secondary | ICD-10-CM | POA: Diagnosis present

## 2011-10-16 DIAGNOSIS — Z79899 Other long term (current) drug therapy: Secondary | ICD-10-CM

## 2011-10-16 DIAGNOSIS — N920 Excessive and frequent menstruation with regular cycle: Secondary | ICD-10-CM | POA: Diagnosis present

## 2011-10-16 DIAGNOSIS — D229 Melanocytic nevi, unspecified: Secondary | ICD-10-CM

## 2011-10-16 DIAGNOSIS — D239 Other benign neoplasm of skin, unspecified: Secondary | ICD-10-CM | POA: Diagnosis present

## 2011-10-16 DIAGNOSIS — J441 Chronic obstructive pulmonary disease with (acute) exacerbation: Secondary | ICD-10-CM | POA: Diagnosis present

## 2011-10-16 DIAGNOSIS — F17210 Nicotine dependence, cigarettes, uncomplicated: Secondary | ICD-10-CM | POA: Diagnosis present

## 2011-10-16 DIAGNOSIS — E1142 Type 2 diabetes mellitus with diabetic polyneuropathy: Secondary | ICD-10-CM | POA: Diagnosis present

## 2011-10-16 DIAGNOSIS — D500 Iron deficiency anemia secondary to blood loss (chronic): Secondary | ICD-10-CM | POA: Diagnosis present

## 2011-10-16 DIAGNOSIS — E669 Obesity, unspecified: Secondary | ICD-10-CM | POA: Diagnosis present

## 2011-10-16 DIAGNOSIS — R0789 Other chest pain: Secondary | ICD-10-CM | POA: Diagnosis present

## 2011-10-16 DIAGNOSIS — J449 Chronic obstructive pulmonary disease, unspecified: Secondary | ICD-10-CM

## 2011-10-16 DIAGNOSIS — J189 Pneumonia, unspecified organism: Principal | ICD-10-CM | POA: Diagnosis present

## 2011-10-16 DIAGNOSIS — I10 Essential (primary) hypertension: Secondary | ICD-10-CM | POA: Diagnosis present

## 2011-10-16 HISTORY — DX: Chronic respiratory failure, unspecified whether with hypoxia or hypercapnia: J96.10

## 2011-10-16 HISTORY — DX: Dependence on supplemental oxygen: Z99.81

## 2011-10-16 LAB — CBC
HCT: 29.6 % — ABNORMAL LOW (ref 36.0–46.0)
HCT: 28 % — ABNORMAL LOW (ref 36.0–46.0)
Hemoglobin: 8.7 g/dL — ABNORMAL LOW (ref 12.0–15.0)
Hemoglobin: 8.3 g/dL — ABNORMAL LOW (ref 12.0–15.0)
MCH: 19.7 pg — ABNORMAL LOW (ref 26.0–34.0)
MCH: 19.8 pg — ABNORMAL LOW (ref 26.0–34.0)
MCHC: 29.4 g/dL — ABNORMAL LOW (ref 30.0–36.0)
MCHC: 29.6 g/dL — ABNORMAL LOW (ref 30.0–36.0)
MCV: 67.1 fL — ABNORMAL LOW (ref 78.0–100.0)
MCV: 66.8 fL — ABNORMAL LOW (ref 78.0–100.0)
Platelets: 227 10*3/uL (ref 150–400)
Platelets: 204 10*3/uL (ref 150–400)
RBC: 4.41 MIL/uL (ref 3.87–5.11)
RBC: 4.19 MIL/uL (ref 3.87–5.11)
RDW: 19.6 % — ABNORMAL HIGH (ref 11.5–15.5)
RDW: 19.4 % — ABNORMAL HIGH (ref 11.5–15.5)
WBC: 6.3 10*3/uL (ref 4.0–10.5)
WBC: 6.3 10*3/uL (ref 4.0–10.5)

## 2011-10-16 LAB — DIFFERENTIAL
Basophils Absolute: 0 10*3/uL (ref 0.0–0.1)
Basophils Absolute: 0 10*3/uL (ref 0.0–0.1)
Basophils Relative: 0 % (ref 0–1)
Basophils Relative: 0 % (ref 0–1)
Eosinophils Absolute: 0 10*3/uL (ref 0.0–0.7)
Eosinophils Absolute: 0 10*3/uL (ref 0.0–0.7)
Eosinophils Relative: 0 % (ref 0–5)
Eosinophils Relative: 0 % (ref 0–5)
Lymphocytes Relative: 22 % (ref 12–46)
Lymphocytes Relative: 18 % (ref 12–46)
Lymphs Abs: 1.4 10*3/uL (ref 0.7–4.0)
Lymphs Abs: 1.1 10*3/uL (ref 0.7–4.0)
Monocytes Absolute: 0.6 10*3/uL (ref 0.1–1.0)
Monocytes Absolute: 0.7 10*3/uL (ref 0.1–1.0)
Monocytes Relative: 9 % (ref 3–12)
Monocytes Relative: 11 % (ref 3–12)
Neutro Abs: 4.3 10*3/uL (ref 1.7–7.7)
Neutro Abs: 4.5 10*3/uL (ref 1.7–7.7)
Neutrophils Relative %: 69 % (ref 43–77)
Neutrophils Relative %: 71 % (ref 43–77)

## 2011-10-16 LAB — COMPREHENSIVE METABOLIC PANEL
ALT: 6 U/L (ref 0–35)
AST: 9 U/L (ref 0–37)
Albumin: 3.4 g/dL — ABNORMAL LOW (ref 3.5–5.2)
Alkaline Phosphatase: 45 U/L (ref 39–117)
BUN: 4 mg/dL — ABNORMAL LOW (ref 6–23)
CO2: 24 mEq/L (ref 19–32)
Calcium: 8.9 mg/dL (ref 8.4–10.5)
Chloride: 97 mEq/L (ref 96–112)
Creatinine, Ser: 0.63 mg/dL (ref 0.50–1.10)
GFR calc Af Amer: >90 mL/min (ref >90)
GFR calc non Af Amer: >90 mL/min (ref >90)
Glucose, Bld: 118 mg/dL — ABNORMAL HIGH (ref 70–99)
Potassium: 2.8 mEq/L — ABNORMAL LOW (ref 3.5–5.1)
Sodium: 134 mEq/L — ABNORMAL LOW (ref 135–145)
Total Bilirubin: 0.5 mg/dL (ref 0.3–1.2)
Total Protein: 7.1 g/dL (ref 6.0–8.3)

## 2011-10-16 LAB — TROPONIN I: Troponin I: <0.3 ng/mL (ref <0.30)

## 2011-10-16 MED ORDER — POTASSIUM CHLORIDE IN NACL 40-0.9 MEQ/L-% IV SOLN
INTRAVENOUS | Status: AC
  Filled 2011-10-16: qty 1000

## 2011-10-16 MED ORDER — GUAIFENESIN 100 MG/5ML PO SOLN
200.0000 mg | ORAL | Status: DC | PRN
  Administered 2011-10-17 – 2011-10-18 (×6): 200 mg via ORAL
  Filled 2011-10-16: qty 10
  Filled 2011-10-16 (×2): qty 5
  Filled 2011-10-16: qty 10
  Filled 2011-10-16: qty 5
  Filled 2011-10-16 (×2): qty 10

## 2011-10-16 MED ORDER — DEXTROSE 5 % IV SOLN
1.0000 g | INTRAVENOUS | Status: DC
  Administered 2011-10-17: 1 g via INTRAVENOUS
  Filled 2011-10-16: qty 10

## 2011-10-16 MED ORDER — POTASSIUM CHLORIDE 10 MEQ/100ML IV SOLN
10.0000 meq | INTRAVENOUS | Status: AC
  Administered 2011-10-16: 10 meq via INTRAVENOUS
  Filled 2011-10-16: qty 100

## 2011-10-16 MED ORDER — KETOROLAC TROMETHAMINE 30 MG/ML IJ SOLN
30.0000 mg | Freq: Once | INTRAMUSCULAR | Status: AC
  Administered 2011-10-16: 30 mg via INTRAVENOUS
  Filled 2011-10-16: qty 1

## 2011-10-16 MED ORDER — PANTOPRAZOLE SODIUM 40 MG PO TBEC
40.0000 mg | DELAYED_RELEASE_TABLET | Freq: Every day | ORAL | Status: DC
  Administered 2011-10-17 – 2011-10-25 (×9): 40 mg via ORAL
  Filled 2011-10-16 (×9): qty 1

## 2011-10-16 MED ORDER — DEXTROSE 5 % IV SOLN
500.0000 mg | INTRAVENOUS | Status: DC
  Administered 2011-10-17: 500 mg via INTRAVENOUS
  Filled 2011-10-16: qty 500

## 2011-10-16 MED ORDER — ALBUTEROL SULFATE (5 MG/ML) 0.5% IN NEBU
2.5000 mg | INHALATION_SOLUTION | RESPIRATORY_TRACT | Status: DC | PRN
  Administered 2011-10-17 – 2011-10-24 (×8): 2.5 mg via RESPIRATORY_TRACT
  Filled 2011-10-16 (×9): qty 0.5

## 2011-10-16 MED ORDER — SODIUM CHLORIDE 0.9 % IV BOLUS (SEPSIS)
500.0000 mL | INTRAVENOUS | Status: AC
  Administered 2011-10-16: 500 mL via INTRAVENOUS

## 2011-10-16 MED ORDER — PREDNISONE 20 MG PO TABS
20.0000 mg | ORAL_TABLET | Freq: Two times a day (BID) | ORAL | Status: DC
  Administered 2011-10-17: 20 mg via ORAL
  Filled 2011-10-16: qty 1

## 2011-10-16 MED ORDER — OXYCODONE-ACETAMINOPHEN 5-325 MG PO TABS
1.0000 | ORAL_TABLET | Freq: Three times a day (TID) | ORAL | Status: DC | PRN
  Administered 2011-10-17 (×3): 1 via ORAL
  Filled 2011-10-16 (×3): qty 1

## 2011-10-16 MED ORDER — DEXTROSE 5 % IV SOLN: 500.0000 mg | INTRAVENOUS | Status: DC

## 2011-10-16 MED ORDER — DEXTROSE 5 % IV SOLN
1.0000 g | INTRAVENOUS | Status: DC
  Administered 2011-10-16: 1 g via INTRAVENOUS
  Filled 2011-10-16: qty 10

## 2011-10-16 MED ORDER — LISINOPRIL 5 MG PO TABS
5.0000 mg | ORAL_TABLET | Freq: Every day | ORAL | Status: DC
  Administered 2011-10-17 – 2011-10-18 (×2): 5 mg via ORAL
  Filled 2011-10-16 (×2): qty 1

## 2011-10-16 MED ORDER — SODIUM CHLORIDE 0.9 % IV SOLN: INTRAVENOUS | Status: DC

## 2011-10-16 MED ORDER — AZITHROMYCIN 500 MG IV SOLR
500.0000 mg | INTRAVENOUS | Status: DC
  Administered 2011-10-16: 500 mg via INTRAVENOUS
  Filled 2011-10-16: qty 500

## 2011-10-16 MED ORDER — POTASSIUM CHLORIDE CRYS ER 20 MEQ PO TBCR
40.0000 meq | EXTENDED_RELEASE_TABLET | Freq: Once | ORAL | Status: AC
  Administered 2011-10-16: 40 meq via ORAL
  Filled 2011-10-16: qty 2

## 2011-10-16 MED ORDER — CITALOPRAM HYDROBROMIDE 20 MG PO TABS
20.0000 mg | ORAL_TABLET | Freq: Every day | ORAL | Status: DC
  Administered 2011-10-17 – 2011-10-25 (×9): 20 mg via ORAL
  Filled 2011-10-16 (×9): qty 1

## 2011-10-16 MED ORDER — ALBUTEROL SULFATE (5 MG/ML) 0.5% IN NEBU
5.0000 mg | INHALATION_SOLUTION | Freq: Once | RESPIRATORY_TRACT | Status: AC
  Administered 2011-10-16: 5 mg via RESPIRATORY_TRACT

## 2011-10-16 MED ORDER — SODIUM CHLORIDE 0.9 % IV SOLN
INTRAVENOUS | Status: DC
  Administered 2011-10-16 (×2): via INTRAVENOUS

## 2011-10-16 MED ORDER — CYCLOBENZAPRINE HCL 10 MG PO TABS
10.0000 mg | ORAL_TABLET | Freq: Once | ORAL | Status: AC
  Administered 2011-10-16: 10 mg via ORAL
  Filled 2011-10-16: qty 1

## 2011-10-16 MED ORDER — POTASSIUM CHLORIDE IN NACL 40-0.9 MEQ/L-% IV SOLN
INTRAVENOUS | Status: AC
  Administered 2011-10-17: via INTRAVENOUS
  Filled 2011-10-16: qty 1000

## 2011-10-16 MED ORDER — IPRATROPIUM BROMIDE 0.02 % IN SOLN
0.5000 mg | Freq: Once | RESPIRATORY_TRACT | Status: AC
  Administered 2011-10-16: 0.5 mg via RESPIRATORY_TRACT

## 2011-10-16 MED ORDER — ASPIRIN 325 MG PO TABS
325.0000 mg | ORAL_TABLET | Freq: Every day | ORAL | Status: DC
  Administered 2011-10-17 – 2011-10-25 (×9): 325 mg via ORAL
  Filled 2011-10-16 (×9): qty 1

## 2011-10-16 NOTE — ED Notes (Signed)
Chest pain since Saturday, On 02 at home.  SOB.  Nausea, fever. Diarrhea.,   cough

## 2011-10-16 NOTE — H&P (Signed)
PCP:   Milinda Antis, MD   Chief Complaint:  Fever and cough  HPI: 46 year old female with 3 days of progressive worsening fever cough and wheezing. She is chronically oxygen dependent at home for the past year for COPD. She has decreased by mouth intake without any nausea vomiting. She denies any dysuria or abdominal pain.  Review of Systems:  Is negative otherwise  Past Medical History: Past Medical History  Diagnosis Date  . Diabetes mellitus   . COPD (chronic obstructive pulmonary disease)   . HTN (hypertension)   . Low back pain   . Tachycardia     never had test done since no insurance  . Depression   . Asthma   . Shortness of breath   . Anxiety   . Gastric erosions     EGD 08/2010.  . Internal hemorrhoids     Colonoscopy 5/12.  [name] Heavy menses   . Chronic respiratory failure with hypoxia     On 2-3 L of oxygen at home   Past Surgical History  Procedure Date  . Cholecystectomy 1990  . Cesarean section     twice  . Kidney surgery     as child for blockages  . Tubal ligation   . Tonsillectomy   . Wrist surgery 1995    Lt wrist    Medications: Prior to Admission medications   Medication Sig Start Date End Date Taking? Authorizing Provider  albuterol (PROVENTIL HFA) 108 (90 BASE) MCG/ACT inhaler Inhale 2 puffs into the lungs every 4 (four) hours as needed for wheezing or shortness of breath. Wheezing/shortnes of breath 12/07/10  Yes Elliot Cousin, MD  aspirin 325 MG tablet Take 325 mg by mouth daily as needed. AS FEVER REDUCER   Yes Historical Provider, MD  Blood Glucose Monitoring Suppl (ACCU-CHEK AVIVA PLUS) W/DEVICE KIT 1 kit by Does not apply route daily. 06/12/11  Yes Salley Scarlet, MD  citalopram (CELEXA) 20 MG tablet Take 1 tablet (20 mg total) by mouth daily. 08/31/11 08/30/12 Yes Salley Scarlet, MD  Fluticasone-Salmeterol (ADVAIR DISKUS) 250-50 MCG/DOSE AEPB Inhale 1 puff into the lungs 2 (two) times daily. 06/09/11 06/08/12 Yes Salley Scarlet, MD    glucose blood (ACCU-CHEK AVIVA) test strip Use as directed for once daily testing  DX 250.00 06/12/11 06/11/12 Yes Salley Scarlet, MD  ibuprofen (ADVIL,MOTRIN) 200 MG tablet Take 200-400 mg by mouth daily as needed. For pain    Yes Historical Provider, MD  Lancets (ACCU-CHEK MULTICLIX) lancets Use as directed once daily with accu chek aviva meter 06/12/11 06/11/12 Yes Salley Scarlet, MD  lisinopril (PRINIVIL,ZESTRIL) 5 MG tablet Take 1 tablet (5 mg total) by mouth daily. 06/09/11  Yes Salley Scarlet, MD  metFORMIN (GLUCOPHAGE) 500 MG tablet TAKE ONE TABLET BY MOUTH TWICE DAILY WITH MEALS 09/29/11  Yes Salley Scarlet, MD  omeprazole (PRILOSEC) 20 MG capsule Take 1 capsule (20 mg total) by mouth daily. 06/13/11  Yes Salley Scarlet, MD  Pseudoephedrine-DM-GG (ROBITUSSIN CF PO) Take 5-10 mLs by mouth as needed. For cough   Yes Historical Provider, MD  oxyCODONE-acetaminophen (PERCOCET) 5-325 MG per tablet Take 1 tablet by mouth 2 (two) times daily as needed for pain. 09/29/11 11/15/11  Salley Scarlet, MD    Allergies:   Allergies  Allergen Reactions  . Codeine Itching and Nausea Only  . Wellbutrin (Bupropion Hcl) Hives    Social History:  reports that she has been smoking Cigarettes.  She has a 30  pack-year smoking history. She uses smokeless tobacco. She reports that she does not drink alcohol or use illicit drugs.  Family History: Family History  Problem Relation Age of Onset  . Heart attack Father 19    deceased, etoh use  . Heart disease Father   . Heart attack Mother 59    deceased  . Diabetes Mother   . Breast cancer Mother   . Heart failure Mother     oxygen dependence, nonsmoker  . Heart disease Mother   . Depression Mother   . Cancer Mother   . Colon cancer Neg Hx   . Liver disease Maternal Aunt 40    died while on liver transplant list  . Heart attack Maternal Grandmother     premature CAD  . Ulcers Sister   . Hypertension Sister     Physical Exam: Filed  Vitals:   10/16/11 1658 10/16/11 1756 10/16/11 2009 10/16/11 2140  BP:  144/59 125/53 125/82  Pulse:  112 98 99  Temp:   99.2 F (37.3 C) 99.2 F (37.3 C)  TempSrc:   Oral Oral  Resp:   22 22  Height:      Weight:    115.7 kg (255 lb 1.2 oz)  SpO2: 94% 91% 95% 96%   General appearance: alert, cooperative and no distress Lungs: diminished breath sounds bibasilar Heart: regular rate and rhythm, S1, S2 normal, no murmur, click, rub or gallop Abdomen: soft, non-tender; bowel sounds normal; no masses,  no organomegaly Extremities: extremities normal, atraumatic, no cyanosis or edema Pulses: 2+ and symmetric Skin: Skin color, texture, turgor normal. No rashes or lesions  Hyperpigmented round skin lesion at rt temporal area present since 2002 and enlarging since then over 1.5 cm in diameter Neurologic: Grossly normal    Labs on Admission:   [facility] 10/16/11 1625  NA 134*  K 2.8*  CL 97  CO2 24  GLUCOSE 118*  BUN 4*  CREATININE 0.63  CALCIUM 8.9  MG --  PHOS --    Basename 10/16/11 1625  AST 9  ALT 6  ALKPHOS 45  BILITOT 0.5  PROT 7.1  ALBUMIN 3.4*    Basename 10/16/11 1625  WBC 6.3  NEUTROABS 4.3  HGB 8.7*  HCT 29.6*  MCV 67.1*  PLT 227    Basename 10/16/11 1625  CKTOTAL --  CKMB --  CKMBINDEX --  TROPONINI <0.30   Radiological Exams on Admission: Dg Chest 2 View  10/16/2011  *RADIOLOGY REPORT*  Clinical Data: Cough, chest pain.  CHEST - 2 VIEW  Comparison: 05/03/2011  Findings: New patchy opacities in both lower lungs and in the left perihilar region.  Heart is normal size.  No effusions.  No acute bony abnormality.  IMPRESSION: Patchy bilateral airspace opacities.  Cannot exclude pneumonia.  Original Report Authenticated By: Cyndie Chime, M.D.    Assessment/Plan Present on Admission:  46 year old female with community acquired pneumonia  .COPD (chronic obstructive pulmonary disease) .PNA (pneumonia) .DM type 2 (diabetes mellitus, type 2) .HTN  (hypertension) .Microcytic anemia .Tobacco abuse .Chronic respiratory failure .Changing mole right temporal area  Place patient on azithromycin and Rocephin. Place on frequent nebulizers. She currently is having no wheezing so we'll hold off on IV Solu-Medrol and placed on prednisone. She is encouraged to stop smoking. She also has a mole right temporal area that showed up in 2002 and has progressively gotten bigger since then advised her she needs to get this evaluated by PCP.   Taison Celani A [id number]  10/16/2011, 9:46 PM

## 2011-10-16 NOTE — ED Provider Notes (Signed)
History   This chart was scribed for Ward Givens, MD by [name]. The patient was seen in room APA05/APA05 and the patient's care was started at 4:17 PM     CSN: [id number]  Arrival date & time 10/16/11  1508   First MD Initiated Contact with Patient 10/16/11 1537      Chief Complaint  Patient presents with  . Chest Pain    (Consider location/radiation/quality/duration/timing/severity/associated sxs/prior treatment) HPI  [name] is a 46 y.o. female who presents to the Emergency Department complaining of chest pain onset two days ago with associated symptoms of couogh, fever (103.4), chills, shortness of breath, wheezing, diarrhea (X 6 per day, small amount), nausea , loss of appetite.  Denies vomiting The pt SOB has been worse today. The pt describes the chest pain centrally located and is pleuritic and described as a tightness, and pressure. Has had this pain before when she had pneumonia. Modifying factors utilizing an albuterol inhaler which provides temporary relief, ran out of her nebulizer over the weekend.  Pt has a hx of diabetes, similar symptoms in the past, where which she had a pneumonia, COPD, last admission at the hospital in January 2013.   Pt denies drinking, vomiting.  PCP is Dr. Jeanice Lim.    Past Medical History  Diagnosis Date  . Diabetes mellitus   . COPD (chronic obstructive pulmonary disease)   . HTN (hypertension)   . Low back pain   . Tachycardia     never had test done since no insurance  . Depression   . Asthma   . Shortness of breath   . Anxiety   . Gastric erosions     EGD 08/2010.  . Internal hemorrhoids     Colonoscopy 5/12.  [name] Heavy menses   . Chronic respiratory failure with hypoxia     On 2-3 L of oxygen at home    Past Surgical History  Procedure Date  . Cholecystectomy 1990  . Cesarean section     twice  . Kidney surgery     as child for blockages  . Tubal ligation   . Tonsillectomy   . Wrist surgery 1995    Lt wrist      Family History  Problem Relation Age of Onset  . Heart attack Father 33    deceased, etoh use  . Heart disease Father   . Heart attack Mother 24    deceased  . Diabetes Mother   . Breast cancer Mother   . Heart failure Mother     oxygen dependence, nonsmoker  . Heart disease Mother   . Depression Mother   . Cancer Mother   . Colon cancer Neg Hx   . Liver disease Maternal Aunt 40    died while on liver transplant list  . Heart attack Maternal Grandmother     premature CAD  . Ulcers Sister   . Hypertension Sister     History  Substance Use Topics  . Smoking status: Current Everyday Smoker -- 1.0 packs/day for 30 years    Types: Cigarettes  . Smokeless tobacco: Current User  . Alcohol Use: No  on oxygen 2.5 lpm [place] Lives with son  OB History    Grav Para Term Preterm Abortions TAB SAB Ect Mult Living                  Review of Systems  All other systems reviewed and are negative.    10 Systems reviewed  and all are negative for acute change except as noted in the HPI.    Allergies  Codeine and Wellbutrin  Home Medications   Current Outpatient Rx  Name Route Sig Dispense Refill  . ALBUTEROL SULFATE HFA 108 (90 BASE) MCG/ACT IN AERS Inhalation Inhale 2 puffs into the lungs every 4 (four) hours as needed for wheezing or shortness of breath. Wheezing/shortnes of breath    . ASPIRIN 325 MG PO TABS Oral Take 325 mg by mouth daily as needed. AS FEVER REDUCER    . ACCU-CHEK AVIVA PLUS W/DEVICE KIT Does not apply 1 kit by Does not apply route daily. 1 kit 0  . CITALOPRAM HYDROBROMIDE 20 MG PO TABS Oral Take 1 tablet (20 mg total) by mouth daily. 30 tablet 3  . FLUTICASONE-SALMETEROL 250-50 MCG/DOSE IN AEPB Inhalation Inhale 1 puff into the lungs 2 (two) times daily. 1 each 2  . GLUCOSE BLOOD VI STRP  Use as directed for once daily testing  DX 250.00 50 each 11  . IBUPROFEN 200 MG PO TABS Oral Take 200-400 mg by mouth daily as needed. For pain     . ACCU-CHEK  MULTICLIX LANCETS MISC  Use as directed once daily with accu chek aviva meter 100 each 11  . LISINOPRIL 5 MG PO TABS Oral Take 1 tablet (5 mg total) by mouth daily. 30 tablet 1  . METFORMIN HCL 500 MG PO TABS  TAKE ONE TABLET BY MOUTH TWICE DAILY WITH MEALS 60 tablet 3  . OMEPRAZOLE 20 MG PO CPDR Oral Take 1 capsule (20 mg total) by mouth daily. 30 capsule 3  . ROBITUSSIN CF PO Oral Take 5-10 mLs by mouth as needed. For cough    . OXYCODONE-ACETAMINOPHEN 5-325 MG PO TABS Oral Take 1 tablet by mouth 2 (two) times daily as needed for pain. 60 tablet 0    BP 134/67  Pulse 110  Temp 99.1 F (37.3 C) (Oral)  Resp 23  Ht 5\' 4"  (1.626 m)  Wt 252 lb (114.306 kg)  BMI 43.26 kg/m2  SpO2 95%  LMP 10/09/2011  Vital signs normal except tachycardia, low grade temp   Physical Exam  Nursing note and vitals reviewed. Constitutional: She appears well-developed and well-nourished.  HENT:  Head: Normocephalic and atraumatic.  Right Ear: External ear normal.  Left Ear: External ear normal.  Nose: Nose normal.       Lips, tongue cracked and dry.   Cardiovascular: Normal rate and normal heart sounds.   Pulmonary/Chest: Effort normal. She has wheezes (diffuse, expiratory).  Abdominal: Soft. Bowel sounds are normal. There is no tenderness.  Musculoskeletal: Normal range of motion.  Neurological: She is alert.  Skin: Skin is warm and dry. No rash noted.  Psychiatric: She has a normal mood and affect. Her behavior is normal.    ED Course  Procedures (including critical care time)   Medications  0.9 %  sodium chloride infusion (  Intravenous New Bag/Given 10/16/11 1739)  cefTRIAXone (ROCEPHIN) 1 g in dextrose 5 % 50 mL IVPB (1 g Intravenous Given 10/16/11 1739)  azithromycin (ZITHROMAX) 500 mg in dextrose 5 % 250 mL IVPB (500 mg Intravenous New Bag/Given 10/16/11 1843)  potassium chloride 10 mEq in 100 mL IVPB (not administered)  sodium chloride 0.9 % bolus 500 mL (500 mL Intravenous Given 10/16/11  1736)  ketorolac (TORADOL) 30 MG/ML injection 30 mg (30 mg Intravenous Given 10/16/11 1737)  cyclobenzaprine (FLEXERIL) tablet 10 mg (10 mg Oral Given 10/16/11 1737)  ipratropium (  ATROVENT) nebulizer solution 0.5 mg (0.5 mg Nebulization Given 10/16/11 1655)  albuterol (PROVENTIL) (5 MG/ML) 0.5% nebulizer solution 5 mg (5 mg Nebulization Given 10/16/11 1655)  potassium chloride SA (K-DUR,KLOR-CON) CR tablet 40 mEq (40 mEq Oral Given 10/16/11 1936)   Pt given nebulizer treatment and started on antibiotics for her pneumonia. Her hypokalemia was treated.      17:36 Dr Onalee Hua, admit to tele, team 2  DIAGNOSTIC STUDIES: Oxygen Saturation is 95% on [place], adequate by my interpretation.    COORDINATION OF CARE:     Results for orders placed during the hospital encounter of 10/16/11  CBC      Component Value Range   WBC 6.3  4.0 - 10.5 K/uL   RBC 4.41  3.87 - 5.11 MIL/uL   Hemoglobin 8.7 (*) 12.0 - 15.0 g/dL   HCT 21.3 (*) 08.6 - 57.8 %   MCV 67.1 (*) 78.0 - 100.0 fL   MCH 19.7 (*) 26.0 - 34.0 pg   MCHC 29.4 (*) 30.0 - 36.0 g/dL   RDW 46.9 (*) 62.9 - 52.8 %   Platelets 227  150 - 400 K/uL  DIFFERENTIAL      Component Value Range   Neutrophils Relative 69  43 - 77 %   Lymphocytes Relative 22  12 - 46 %   Monocytes Relative 9  3 - 12 %   Eosinophils Relative 0  0 - 5 %   Basophils Relative 0  0 - 1 %   Neutro Abs 4.3  1.7 - 7.7 K/uL   Lymphs Abs 1.4  0.7 - 4.0 K/uL   Monocytes Absolute 0.6  0.1 - 1.0 K/uL   Eosinophils Absolute 0.0  0.0 - 0.7 K/uL   Basophils Absolute 0.0  0.0 - 0.1 K/uL   RBC Morphology POLYCHROMASIA PRESENT     Smear Review LARGE PLATELETS PRESENT    COMPREHENSIVE METABOLIC PANEL      Component Value Range   Sodium 134 (*) 135 - 145 mEq/L   Potassium 2.8 (*) 3.5 - 5.1 mEq/L   Chloride 97  96 - 112 mEq/L   CO2 24  19 - 32 mEq/L   Glucose, Bld 118 (*) 70 - 99 mg/dL   BUN 4 (*) 6 - 23 mg/dL   Creatinine, Ser 4.13  0.50 - 1.10 mg/dL   Calcium 8.9  8.4 - 24.4 mg/dL     Total Protein 7.1  6.0 - 8.3 g/dL   Albumin 3.4 (*) 3.5 - 5.2 g/dL   AST 9  0 - 37 U/L   ALT 6  0 - 35 U/L   Alkaline Phosphatase 45  39 - 117 U/L   Total Bilirubin 0.5  0.3 - 1.2 mg/dL   GFR calc non Af Amer >90  >90 mL/min   GFR calc Af Amer >90  >90 mL/min  TROPONIN I      Component Value Range   Troponin I <0.30  <0.30 ng/mL   Laboratory interpretation all normal except hypokalemia   Dg Chest 2 View  10/16/2011  *RADIOLOGY REPORT*  Clinical Data: Cough, chest pain.  CHEST - 2 VIEW  Comparison: 05/03/2011  Findings: New patchy opacities in both lower lungs and in the left perihilar region.  Heart is normal size.  No effusions.  No acute bony abnormality.  IMPRESSION: Patchy bilateral airspace opacities.  Cannot exclude pneumonia.  Original Report Authenticated By: Cyndie Chime, M.D.     Date: 10/16/2011  Rate: 106  Rhythm: sinus  tachycardia  QRS Axis: normal  Intervals: normal  ST/T Wave abnormalities: normal  Conduction Disutrbances:none  Narrative Interpretation:   Old EKG Reviewed: unchanged from  02/02/2011    1. Community acquired pneumonia   2. Hypokalemia   3. COPD exacerbation      Plan admission   CRITICAL CARE Performed by: Devoria Albe L   Total critical care time: 30 min  Critical care time was exclusive of separately billable procedures and treating other patients.  Critical care was necessary to treat or prevent imminent or life-threatening deterioration.  Critical care was time spent personally by me on the following activities: development of treatment plan with patient and/or surrogate as well as nursing, discussions with consultants, evaluation of patient's response to treatment, examination of patient, obtaining history from patient or surrogate, ordering and performing treatments and interventions, ordering and review of laboratory studies, ordering and review of radiographic studies, pulse oximetry and re-evaluation of patient's  condition.   MDM   I personally performed the services described in this documentation, which was scribed in my presence. The recorded information has been reviewed and considered.  Devoria Albe, MD, Armando Gang    Ward Givens, MD 10/16/11 1945

## 2011-10-17 DIAGNOSIS — J159 Unspecified bacterial pneumonia: Secondary | ICD-10-CM

## 2011-10-17 DIAGNOSIS — J9610 Chronic respiratory failure, unspecified whether with hypoxia or hypercapnia: Secondary | ICD-10-CM

## 2011-10-17 DIAGNOSIS — F17200 Nicotine dependence, unspecified, uncomplicated: Secondary | ICD-10-CM

## 2011-10-17 DIAGNOSIS — J441 Chronic obstructive pulmonary disease with (acute) exacerbation: Secondary | ICD-10-CM

## 2011-10-17 LAB — GLUCOSE, CAPILLARY
Glucose-Capillary: 155 mg/dL — ABNORMAL HIGH (ref 70–99)
Glucose-Capillary: 243 mg/dL — ABNORMAL HIGH (ref 70–99)
Glucose-Capillary: 271 mg/dL — ABNORMAL HIGH (ref 70–99)

## 2011-10-17 LAB — BASIC METABOLIC PANEL
BUN: 5 mg/dL — ABNORMAL LOW (ref 6–23)
CO2: 23 mEq/L (ref 19–32)
Calcium: 8.7 mg/dL (ref 8.4–10.5)
Chloride: 100 mEq/L (ref 96–112)
Creatinine, Ser: 0.58 mg/dL (ref 0.50–1.10)
GFR calc Af Amer: >90 mL/min (ref >90)
GFR calc non Af Amer: >90 mL/min (ref >90)
Glucose, Bld: 108 mg/dL — ABNORMAL HIGH (ref 70–99)
Potassium: 3.4 mEq/L — ABNORMAL LOW (ref 3.5–5.1)
Sodium: 135 mEq/L (ref 135–145)

## 2011-10-17 LAB — STREP PNEUMONIAE URINARY ANTIGEN: Strep Pneumo Urinary Antigen: NEGATIVE

## 2011-10-17 MED ORDER — ALBUTEROL SULFATE (5 MG/ML) 0.5% IN NEBU
2.5000 mg | INHALATION_SOLUTION | Freq: Four times a day (QID) | RESPIRATORY_TRACT | Status: DC
  Administered 2011-10-17 – 2011-10-22 (×24): 2.5 mg via RESPIRATORY_TRACT
  Filled 2011-10-17 (×23): qty 0.5

## 2011-10-17 MED ORDER — FLUTICASONE PROPIONATE 50 MCG/ACT NA SUSP
1.0000 | Freq: Every day | NASAL | Status: DC
  Administered 2011-10-17 – 2011-10-25 (×9): 1 via NASAL
  Filled 2011-10-17: qty 16

## 2011-10-17 MED ORDER — INSULIN ASPART 100 UNIT/ML ~~LOC~~ SOLN
0.0000 [IU] | Freq: Three times a day (TID) | SUBCUTANEOUS | Status: DC
  Administered 2011-10-17: 4 [IU] via SUBCUTANEOUS
  Administered 2011-10-17 – 2011-10-18 (×2): 7 [IU] via SUBCUTANEOUS
  Administered 2011-10-18: 11 [IU] via SUBCUTANEOUS
  Administered 2011-10-18 – 2011-10-19 (×2): 7 [IU] via SUBCUTANEOUS
  Administered 2011-10-19 (×2): 4 [IU] via SUBCUTANEOUS
  Administered 2011-10-20 (×2): 7 [IU] via SUBCUTANEOUS
  Administered 2011-10-20: 4 [IU] via SUBCUTANEOUS
  Administered 2011-10-21: 7 [IU] via SUBCUTANEOUS
  Administered 2011-10-21: 4 [IU] via SUBCUTANEOUS
  Administered 2011-10-21 – 2011-10-22 (×4): 7 [IU] via SUBCUTANEOUS
  Administered 2011-10-23: 4 [IU] via SUBCUTANEOUS
  Administered 2011-10-23: 15 [IU] via SUBCUTANEOUS
  Administered 2011-10-23 – 2011-10-24 (×2): 7 [IU] via SUBCUTANEOUS
  Administered 2011-10-24: 11 [IU] via SUBCUTANEOUS
  Administered 2011-10-24 – 2011-10-25 (×3): 7 [IU] via SUBCUTANEOUS

## 2011-10-17 MED ORDER — ALBUTEROL SULFATE (5 MG/ML) 0.5% IN NEBU: 2.5000 mg | INHALATION_SOLUTION | RESPIRATORY_TRACT | Status: DC | PRN

## 2011-10-17 MED ORDER — NICOTINE 14 MG/24HR TD PT24
14.0000 mg | MEDICATED_PATCH | Freq: Every day | TRANSDERMAL | Status: DC
  Administered 2011-10-17 – 2011-10-25 (×9): 14 mg via TRANSDERMAL
  Filled 2011-10-17 (×9): qty 1

## 2011-10-17 MED ORDER — SODIUM CHLORIDE 0.9 % IJ SOLN
INTRAMUSCULAR | Status: AC
  Administered 2011-10-18: 18:00:00
  Filled 2011-10-17: qty 3

## 2011-10-17 MED ORDER — METHYLPREDNISOLONE SODIUM SUCC 125 MG IJ SOLR
125.0000 mg | Freq: Four times a day (QID) | INTRAMUSCULAR | Status: DC
  Administered 2011-10-17 – 2011-10-18 (×3): 125 mg via INTRAVENOUS
  Filled 2011-10-17 (×4): qty 2

## 2011-10-17 MED ORDER — INSULIN ASPART 100 UNIT/ML ~~LOC~~ SOLN
0.0000 [IU] | Freq: Every day | SUBCUTANEOUS | Status: DC
  Administered 2011-10-17: 3 [IU] via SUBCUTANEOUS
  Administered 2011-10-18: 2 [IU] via SUBCUTANEOUS
  Administered 2011-10-19 – 2011-10-20 (×2): 3 [IU] via SUBCUTANEOUS
  Administered 2011-10-21 – 2011-10-23 (×3): 4 [IU] via SUBCUTANEOUS
  Administered 2011-10-24: 3 [IU] via SUBCUTANEOUS

## 2011-10-17 MED ORDER — METFORMIN HCL 500 MG PO TABS
500.0000 mg | ORAL_TABLET | Freq: Two times a day (BID) | ORAL | Status: DC
  Administered 2011-10-17 – 2011-10-25 (×17): 500 mg via ORAL
  Filled 2011-10-17 (×17): qty 1

## 2011-10-17 NOTE — Progress Notes (Signed)
Antibiotic doses reviewed for renal function; Rocephin & Zithromax ordered for COPD exacerbation.  The doses are appropriate & are not renally dose adjusted. Pharmacy will sign off.  Please re-consult as needed. Thank you. Junita Push, PharmD, BCPS 10/17/2011@7 :45 AM

## 2011-10-17 NOTE — Progress Notes (Signed)
UR Chart Review Completed  

## 2011-10-17 NOTE — Care Management Note (Signed)
    Page 1 of 1   10/25/2011     2:01:59 PM   CARE MANAGEMENT NOTE 10/25/2011  Patient:  [name],[name]   Account Number:  [id number]  Date Initiated:  10/17/2011  Documentation initiated by:  Rosemary Holms  Subjective/Objective Assessment:   Pt admitted with possible PNA, COPD exacerbation. Lives at home with children. On chronic O2 at home.     Action/Plan:   Spoke to pt at bedside. She is concerned about affording her antibiotics at discharge. CM will follow to see if medication assistance is needed.   Anticipated DC Date:  10/25/2011   Anticipated DC Plan:  HOME W HOME HEALTH SERVICES  In-house referral  Financial Counselor      DC Planning Services  CM consult      Choice offered to / List presented to:  C-1 Patient        HH arranged  HH-1 RN  HH-10 DISEASE MANAGEMENT      HH agency  [facility]   Status of service:  Completed, signed off Medicare Important Message given?   (If response is "NO", the following Medicare IM given date fields will be blank) Date Medicare IM given:   Date Additional Medicare IM given:    Discharge Disposition:  HOME W HOME HEALTH SERVICES  Per UR Regulation:    If discussed at Long Length of Stay Meetings, dates discussed:    Comments:  10/25/11 1400 Sheila Stehle RN BSN CM Spoke to pt who states she believes she will soon get retro Medicaid. Assistance for antibiotic approved by Consuelo Pandy. Voucher for [employer] given to RN to give to pt with RXs. [facility] notified of referral for [facility] RN for COPD program. RN will be there Friday or Saturday.  10/23/11 1015 Sheila Bujak RN BSN CM spoke with pt. no HH needs identified  10/17/11 1000 Sheila Buckle Leanord Hawking RN BSN CM

## 2011-10-17 NOTE — Progress Notes (Signed)
Subjective: This lady was admitted with bilateral pneumonia but also she appears to have significant bronchospasm. She is finding it difficult to cough up sputum.           Physical Exam: Blood pressure 141/65, pulse 117, temperature 99.2 F (37.3 C), temperature source Oral, resp. rate 22, height 5\' 4"  (1.626 m), weight 115.7 kg (255 lb 1.2 oz), last menstrual period 10/09/2011, SpO2 92.00%. She looks systemically well, she is not toxic or septic. Her lung fields show bilateral wheezing which is significant. There are no crackles or bronchial breathing. Heart sounds are present and normal. She is alert and oriented.   Investigations:  Recent Results (from the past 240 hour(s))  CULTURE, BLOOD (ROUTINE X 2)     Status: Normal (Preliminary result)   Collection Time   10/16/11  9:43 PM      Component Value Range Status Comment   Specimen Description BLOOD LEFT ANTECUBITAL   Final    Special Requests BOTTLES DRAWN AEROBIC AND ANAEROBIC >15CC EACH   Final    Culture PENDING   Incomplete    Report Status PENDING   Incomplete   CULTURE, BLOOD (ROUTINE X 2)     Status: Normal (Preliminary result)   Collection Time   10/16/11  9:49 PM      Component Value Range Status Comment   Specimen Description BLOOD RIGHT ANTECUBITAL   Final    Special Requests     Final    Value: BOTTLES DRAWN AEROBIC AND ANAEROBIC AEB 5CC ANA 6CC   Culture PENDING   Incomplete    Report Status PENDING   Incomplete      Basic Metabolic Panel:  Basename 10/17/11 0523 10/16/11 1625  NA 135 134*  K 3.4* 2.8*  CL 100 97  CO2 23 24  GLUCOSE 108* 118*  BUN 5* 4*  CREATININE 0.58 0.63  CALCIUM 8.7 8.9  MG -- --  PHOS -- --   Liver Function Tests:  [facility] 10/16/11 1625  AST 9  ALT 6  ALKPHOS 45  BILITOT 0.5  PROT 7.1  ALBUMIN 3.4*     CBC:  Basename 10/16/11 2200 10/16/11 1625  WBC 6.3 6.3  NEUTROABS 4.5 4.3  HGB 8.3* 8.7*  HCT 28.0* 29.6*  MCV 66.8* 67.1*  PLT 204 227    Dg Chest 2  View  10/16/2011  *RADIOLOGY REPORT*  Clinical Data: Cough, chest pain.  CHEST - 2 VIEW  Comparison: 05/03/2011  Findings: New patchy opacities in both lower lungs and in the left perihilar region.  Heart is normal size.  No effusions.  No acute bony abnormality.  IMPRESSION: Patchy bilateral airspace opacities.  Cannot exclude pneumonia.  Original Report Authenticated By: Cyndie Chime, M.D.      Medications:  Scheduled:   . albuterol  2.5 mg Nebulization QID  . albuterol  5 mg Nebulization Once  . aspirin  325 mg Oral Daily  . azithromycin  500 mg Intravenous Q24H  . cefTRIAXone (ROCEPHIN)  IV  1 g Intravenous Q24H  . citalopram  20 mg Oral Daily  . cyclobenzaprine  10 mg Oral Once  . ipratropium  0.5 mg Nebulization Once  . ketorolac  30 mg Intravenous Once  . lisinopril  5 mg Oral Daily  . methylPREDNISolone (SOLU-MEDROL) injection  125 mg Intravenous Q6H  . pantoprazole  40 mg Oral Q1200  . potassium chloride  10 mEq Intravenous Q1 Hr x 3  . potassium chloride SA  40 mEq Oral  Once  . sodium chloride  500 mL Intravenous STAT  . sodium chloride      . DISCONTD: azithromycin  500 mg Intravenous Q24H  . DISCONTD: azithromycin  500 mg Intravenous Q24H  . DISCONTD: cefTRIAXone (ROCEPHIN)  IV  1 g Intravenous Q24H  . DISCONTD: predniSONE  20 mg Oral BID WC    Impression: 1. Possible bilateral pneumonia. 2. Significant bronchospasm/bronchitis/COPD. 3. Hypertension. 4. Type 2 diabetes mellitus. 5. Tobacco abuse. 6. Obesity. 7. Microcytic anemia. History of menorrhagia. Erosive gastritis and internal hemorrhoids found on EGD and colonoscopy respectively in 2012.     Plan: 1. Continue with antibiotics. 2. Intravenous steroids. 3. Scheduled bronchodilators.     LOS: 1 day   Wilson Singer Pager [phone]  10/17/2011, 9:42 AM

## 2011-10-18 ENCOUNTER — Telehealth: Payer: Self-pay | Admitting: Family Medicine

## 2011-10-18 DIAGNOSIS — J159 Unspecified bacterial pneumonia: Secondary | ICD-10-CM

## 2011-10-18 DIAGNOSIS — R112 Nausea with vomiting, unspecified: Secondary | ICD-10-CM

## 2011-10-18 DIAGNOSIS — J9610 Chronic respiratory failure, unspecified whether with hypoxia or hypercapnia: Secondary | ICD-10-CM

## 2011-10-18 DIAGNOSIS — J441 Chronic obstructive pulmonary disease with (acute) exacerbation: Secondary | ICD-10-CM

## 2011-10-18 LAB — CARDIAC PANEL(CRET KIN+CKTOT+MB+TROPI)
CK, MB: 1.6 ng/mL (ref 0.3–4.0)
Relative Index: INVALID (ref 0.0–2.5)
Total CK: 51 U/L (ref 7–177)
Troponin I: <0.3 ng/mL (ref <0.30)

## 2011-10-18 LAB — GLUCOSE, CAPILLARY
Glucose-Capillary: 212 mg/dL — ABNORMAL HIGH (ref 70–99)
Glucose-Capillary: 265 mg/dL — ABNORMAL HIGH (ref 70–99)
Glucose-Capillary: 216 mg/dL — ABNORMAL HIGH (ref 70–99)
Glucose-Capillary: 202 mg/dL — ABNORMAL HIGH (ref 70–99)

## 2011-10-18 LAB — LEGIONELLA ANTIGEN, URINE: Legionella Antigen, Urine: NEGATIVE

## 2011-10-18 LAB — PREPARE RBC (CROSSMATCH)

## 2011-10-18 LAB — ABO/RH: ABO/RH(D): A POS

## 2011-10-18 MED ORDER — SODIUM CHLORIDE 0.9 % IJ SOLN
INTRAMUSCULAR | Status: AC
  Administered 2011-10-18: 23:00:00
  Filled 2011-10-18: qty 3

## 2011-10-18 MED ORDER — LEVOFLOXACIN 500 MG PO TABS
500.0000 mg | ORAL_TABLET | Freq: Every day | ORAL | Status: DC
  Administered 2011-10-18 – 2011-10-25 (×8): 500 mg via ORAL
  Filled 2011-10-18: qty 2
  Filled 2011-10-18 (×2): qty 1
  Filled 2011-10-18: qty 2
  Filled 2011-10-18 (×3): qty 1
  Filled 2011-10-18: qty 2

## 2011-10-18 MED ORDER — GUAIFENESIN-DM 100-10 MG/5ML PO SYRP
5.0000 mL | ORAL_SOLUTION | ORAL | Status: DC
  Administered 2011-10-18 – 2011-10-25 (×43): 5 mL via ORAL
  Filled 2011-10-18 (×42): qty 5

## 2011-10-18 MED ORDER — METHYLPREDNISOLONE SODIUM SUCC 125 MG IJ SOLR
80.0000 mg | Freq: Four times a day (QID) | INTRAMUSCULAR | Status: DC
  Administered 2011-10-18 – 2011-10-20 (×8): 80 mg via INTRAVENOUS
  Filled 2011-10-18 (×8): qty 2

## 2011-10-18 MED ORDER — OXYCODONE-ACETAMINOPHEN 5-325 MG PO TABS
1.0000 | ORAL_TABLET | ORAL | Status: DC | PRN
  Administered 2011-10-18 – 2011-10-25 (×42): 1 via ORAL
  Filled 2011-10-18 (×42): qty 1

## 2011-10-18 MED ORDER — POTASSIUM CHLORIDE CRYS ER 20 MEQ PO TBCR
40.0000 meq | EXTENDED_RELEASE_TABLET | Freq: Once | ORAL | Status: AC
  Administered 2011-10-18: 40 meq via ORAL
  Filled 2011-10-18: qty 2

## 2011-10-18 MED ORDER — AMLODIPINE BESYLATE 5 MG PO TABS
5.0000 mg | ORAL_TABLET | Freq: Every day | ORAL | Status: DC
  Administered 2011-10-18 – 2011-10-25 (×8): 5 mg via ORAL
  Filled 2011-10-18 (×8): qty 1

## 2011-10-18 NOTE — Progress Notes (Signed)
Subjective: This lady is not feeling significantly better. She now has chest pain which I am sure is musculoskeletal because she has had intractable coughing. She is anemic and this is from her menorrhagia. She just had a period which lasted 7 days associated with significant amounts of clots. I think her anemia is symptomatic enough that it is not helping her respiratory status.          Physical Exam: Blood pressure 120/72, pulse 105, temperature 97.4 F (36.3 C), temperature source Oral, resp. rate 20, height 5\' 4"  (1.626 m), weight 115.7 kg (255 lb 1.2 oz), last menstrual period 10/09/2011, SpO2 93.00%. She looks systemically well, she is not toxic or septic. Her lung fields show bilateral wheezing which is better than yesterday. There are no crackles or bronchial breathing. She does have anterior chest wall tenderness, reproducing her chest pain. Heart sounds are present and normal. She is alert and oriented.   Investigations:  Recent Results (from the past 240 hour(s))  CULTURE, BLOOD (ROUTINE X 2)     Status: Normal (Preliminary result)   Collection Time   10/16/11  9:43 PM      Component Value Range Status Comment   Specimen Description BLOOD LEFT ANTECUBITAL   Final    Special Requests BOTTLES DRAWN AEROBIC AND ANAEROBIC 18CC EACH   Final    Culture NO GROWTH 2 DAYS   Final    Report Status PENDING   Incomplete   CULTURE, BLOOD (ROUTINE X 2)     Status: Normal (Preliminary result)   Collection Time   10/16/11  9:49 PM      Component Value Range Status Comment   Specimen Description BLOOD RIGHT ANTECUBITAL DRAWN BY RN   Final    Special Requests     Final    Value: BOTTLES DRAWN AEROBIC AND ANAEROBIC AEB=5CC ANA=6CC   Culture NO GROWTH 2 DAYS   Final    Report Status PENDING   Incomplete      Basic Metabolic Panel:  Basename 10/17/11 0523 10/16/11 1625  NA 135 134*  K 3.4* 2.8*  CL 100 97  CO2 23 24  GLUCOSE 108* 118*  BUN 5* 4*  CREATININE 0.58 0.63  CALCIUM  8.7 8.9  MG -- --  PHOS -- --   Liver Function Tests:  [facility] 10/16/11 1625  AST 9  ALT 6  ALKPHOS 45  BILITOT 0.5  PROT 7.1  ALBUMIN 3.4*     CBC:  Basename 10/16/11 2200 10/16/11 1625  WBC 6.3 6.3  NEUTROABS 4.5 4.3  HGB 8.3* 8.7*  HCT 28.0* 29.6*  MCV 66.8* 67.1*  PLT 204 227    Dg Chest 2 View  10/16/2011  *RADIOLOGY REPORT*  Clinical Data: Cough, chest pain.  CHEST - 2 VIEW  Comparison: 05/03/2011  Findings: New patchy opacities in both lower lungs and in the left perihilar region.  Heart is normal size.  No effusions.  No acute bony abnormality.  IMPRESSION: Patchy bilateral airspace opacities.  Cannot exclude pneumonia.  Original Report Authenticated By: Cyndie Chime, M.D.      Medications:  Scheduled:    . albuterol  2.5 mg Nebulization QID  . amLODipine  5 mg Oral Daily  . aspirin  325 mg Oral Daily  . citalopram  20 mg Oral Daily  . fluticasone  1 spray Each Nare Daily  . guaiFENesin-dextromethorphan  5 mL Oral Q4H  . insulin aspart  0-20 Units Subcutaneous TID WC  . insulin aspart  0-5 Units Subcutaneous QHS  . levofloxacin  500 mg Oral Daily  . metFORMIN  500 mg Oral BID WC  . methylPREDNISolone (SOLU-MEDROL) injection  80 mg Intravenous Q6H  . nicotine  14 mg Transdermal Daily  . pantoprazole  40 mg Oral Q1200  . potassium chloride  40 mEq Oral Once  . sodium chloride      . DISCONTD: azithromycin  500 mg Intravenous Q24H  . DISCONTD: cefTRIAXone (ROCEPHIN)  IV  1 g Intravenous Q24H  . DISCONTD: lisinopril  5 mg Oral Daily  . DISCONTD: methylPREDNISolone (SOLU-MEDROL) injection  125 mg Intravenous Q6H    Impression: 1. Possible bilateral pneumonia. 2. Significant bronchospasm/bronchitis/COPD, slowly improving. 3. Hypertension. 4. Type 2 diabetes mellitus. 5. Tobacco abuse. 6. Obesity. 7. Symptomatic Microcytic anemia. History of menorrhagia. Erosive gastritis and internal hemorrhoids found on EGD and colonoscopy respectively in  2012.     Plan: 1. Reduce IV steroids as I think her bronchospasm is improving. 2. Scheduled antitussives. 3. Blood transfusion of 2 units for symptomatic anemia. She will need to see a gynecologist as an outpatient. 4. Discontinue IV antibiotics and switched to oral Levaquin. 5. Hopefully she can go home in the next 1-2 days.     LOS: 2 days   Wilson Singer Pager [phone]  10/18/2011, 11:13 AM

## 2011-10-18 NOTE — Telephone Encounter (Signed)
Will forward to the Dr.

## 2011-10-19 ENCOUNTER — Telehealth: Payer: Self-pay | Admitting: Family Medicine

## 2011-10-19 ENCOUNTER — Inpatient Hospital Stay (HOSPITAL_COMMUNITY): Payer: Medicaid Other

## 2011-10-19 DIAGNOSIS — R112 Nausea with vomiting, unspecified: Secondary | ICD-10-CM

## 2011-10-19 DIAGNOSIS — J159 Unspecified bacterial pneumonia: Secondary | ICD-10-CM

## 2011-10-19 DIAGNOSIS — J9610 Chronic respiratory failure, unspecified whether with hypoxia or hypercapnia: Secondary | ICD-10-CM

## 2011-10-19 DIAGNOSIS — J441 Chronic obstructive pulmonary disease with (acute) exacerbation: Secondary | ICD-10-CM

## 2011-10-19 LAB — BASIC METABOLIC PANEL
BUN: 11 mg/dL (ref 6–23)
CO2: 26 mEq/L (ref 19–32)
Calcium: 9.6 mg/dL (ref 8.4–10.5)
Chloride: 101 mEq/L (ref 96–112)
Creatinine, Ser: 0.56 mg/dL (ref 0.50–1.10)
GFR calc Af Amer: >90 mL/min (ref >90)
GFR calc non Af Amer: >90 mL/min (ref >90)
Glucose, Bld: 193 mg/dL — ABNORMAL HIGH (ref 70–99)
Potassium: 4.1 mEq/L (ref 3.5–5.1)
Sodium: 140 mEq/L (ref 135–145)

## 2011-10-19 LAB — CBC
HCT: 35.8 % — ABNORMAL LOW (ref 36.0–46.0)
Hemoglobin: 10.6 g/dL — ABNORMAL LOW (ref 12.0–15.0)
MCH: 20.7 pg — ABNORMAL LOW (ref 26.0–34.0)
MCHC: 29.6 g/dL — ABNORMAL LOW (ref 30.0–36.0)
MCV: 69.8 fL — ABNORMAL LOW (ref 78.0–100.0)
Platelets: 294 10*3/uL (ref 150–400)
RBC: 5.13 MIL/uL — ABNORMAL HIGH (ref 3.87–5.11)
RDW: 20.3 % — ABNORMAL HIGH (ref 11.5–15.5)
WBC: 23.9 10*3/uL — ABNORMAL HIGH (ref 4.0–10.5)

## 2011-10-19 LAB — TYPE AND SCREEN
ABO/RH(D): A POS
Antibody Screen: NEGATIVE
Unit division: 0
Unit division: 0

## 2011-10-19 LAB — GLUCOSE, CAPILLARY
Glucose-Capillary: 176 mg/dL — ABNORMAL HIGH (ref 70–99)
Glucose-Capillary: 235 mg/dL — ABNORMAL HIGH (ref 70–99)
Glucose-Capillary: 176 mg/dL — ABNORMAL HIGH (ref 70–99)
Glucose-Capillary: 254 mg/dL — ABNORMAL HIGH (ref 70–99)

## 2011-10-19 MED ORDER — GUAIFENESIN ER 600 MG PO TB12
600.0000 mg | ORAL_TABLET | Freq: Two times a day (BID) | ORAL | Status: DC
  Administered 2011-10-19 – 2011-10-25 (×13): 600 mg via ORAL
  Filled 2011-10-19 (×13): qty 1

## 2011-10-19 MED ORDER — SODIUM CHLORIDE 0.9 % IJ SOLN
INTRAMUSCULAR | Status: AC
  Administered 2011-10-19: 10 mL
  Filled 2011-10-19: qty 3

## 2011-10-19 MED ORDER — TIOTROPIUM BROMIDE MONOHYDRATE 18 MCG IN CAPS
18.0000 ug | ORAL_CAPSULE | Freq: Every day | RESPIRATORY_TRACT | Status: DC
Start: 2011-10-19 — End: 2011-10-25
  Administered 2011-10-20 – 2011-10-25 (×6): 18 ug via RESPIRATORY_TRACT
  Filled 2011-10-19 (×2): qty 5

## 2011-10-19 MED ORDER — FLUTICASONE-SALMETEROL 250-50 MCG/DOSE IN AEPB
1.0000 | INHALATION_SPRAY | Freq: Two times a day (BID) | RESPIRATORY_TRACT | Status: DC
  Administered 2011-10-19 – 2011-10-25 (×12): 1 via RESPIRATORY_TRACT
  Filled 2011-10-19: qty 14

## 2011-10-19 MED ORDER — SODIUM CHLORIDE 0.9 % IJ SOLN
INTRAMUSCULAR | Status: AC
  Administered 2011-10-19
  Filled 2011-10-19: qty 3

## 2011-10-19 NOTE — Telephone Encounter (Signed)
Spoke with them , they can not read something and need the full names instead of abbreviations.

## 2011-10-19 NOTE — Telephone Encounter (Signed)
I have received a copy of the fax and gave it to the Dr to clarify

## 2011-10-19 NOTE — Progress Notes (Addendum)
Pt seems to be having sweats periodically. She is red and sweaty at times, without fever. States that this only occurs at home when she has a fever. According to pt these sweats were going on at home, with a fever, and since then here at the hospital. Roger Shelter, Raquel Sarna

## 2011-10-19 NOTE — Progress Notes (Addendum)
Notified RT that pt c/o SOB and sounds increased wheezing/ some ronchi. She appears to be breathing more shallow.  I encouraged her to relax, HOB is elevated. BP 134/79, P97, T 98.1, O2 84% on 5L O2.RT is coming to assess and possibly give breathing treatment, will continue to monitor. Increased O2 to 6L per RT request. Pt talking and currently more relaxed than previously. O2 still increasing to 87% with deep breaths. Deberah Castle Warner 0600: RT assessed pt, Venti mask applied at 50%, RT stated that O2 came up on this.  Upon assessment, pt appears much more comfortable, relaxed and less shallow breathing. Sheryn Bison

## 2011-10-19 NOTE — Progress Notes (Signed)
Subjective: Feels a little better today.  She thinks her cough is becoming more productive.  Breathing is getting slightly better.  Still has pleuritic chest pain with coughing. She was placed on a 50% VM this morning for low pulse ox.  Objective: Vital signs in last 24 hours: Temp:  [97.9 F (36.6 C)-99 F (37.2 C)] 98.1 F (36.7 C) (06/27 0454) Pulse Rate:  [97-113] 97  (06/27 0454) Resp:  [20-24] 24  (06/27 0454) BP: (106-142)/(61-79) 134/79 mmHg (06/27 0454) SpO2:  [83 %-95 %] 95 % (06/27 1113) FiO2 (%):  [50 %] 50 % (06/27 1113) Weight change:  Last BM Date: 10/17/11  Intake/Output from previous day: 06/26 0701 - 06/27 0700 In: 1895 [P.O.:840; I.V.:250; Blood:805] Out: 400 [Urine:400] Total I/O In: 240 [P.O.:240] Out: 350 [Urine:350]   Physical Exam: General: Alert, awake, oriented x3, in no acute distress. She is not using accessory muscles and can carry on a conversation. HEENT: No bruits, no goiter. Heart: tachycardic Lungs: b/l exp wheezes Abdomen: Soft, nontender, nondistended, positive bowel sounds. Extremities: No clubbing cyanosis or edema with positive pedal pulses. Neuro: Grossly intact, nonfocal.    Lab Results: Basic Metabolic Panel:  Basename 10/19/11 0436 10/17/11 0523  NA 140 135  K 4.1 3.4*  CL 101 100  CO2 26 23  GLUCOSE 193* 108*  BUN 11 5*  CREATININE 0.56 0.58  CALCIUM 9.6 8.7  MG -- --  PHOS -- --   Liver Function Tests:  Basename 10/16/11 1625  AST 9  ALT 6  ALKPHOS 45  BILITOT 0.5  PROT 7.1  ALBUMIN 3.4*   No results found for this basename: LIPASE:2,AMYLASE:2 in the last 72 hours No results found for this basename: AMMONIA:2 in the last 72 hours CBC:  Basename 10/19/11 0436 10/16/11 2200 10/16/11 1625  WBC 23.9* 6.3 --  NEUTROABS -- 4.5 4.3  HGB 10.6* 8.3* --  HCT 35.8* 28.0* --  MCV 69.8* 66.8* --  PLT 294 204 --   Cardiac Enzymes:  Basename 10/18/11 0027 10/16/11 1625  CKTOTAL 51 --  CKMB 1.6 --  CKMBINDEX  -- --  TROPONINI <0.30 <0.30   BNP: No results found for this basename: PROBNP:3 in the last 72 hours D-Dimer: No results found for this basename: DDIMER:2 in the last 72 hours CBG:  Basename 10/19/11 1114 10/19/11 0740 10/18/11 2106 10/18/11 1623 10/18/11 1105 10/18/11 0739  GLUCAP 235* 176* 202* 216* 265* 212*   Hemoglobin A1C: No results found for this basename: HGBA1C in the last 72 hours Fasting Lipid Panel: No results found for this basename: CHOL,HDL,LDLCALC,TRIG,CHOLHDL,LDLDIRECT in the last 72 hours Thyroid Function Tests: No results found for this basename: TSH,T4TOTAL,FREET4,T3FREE,THYROIDAB in the last 72 hours Anemia Panel: No results found for this basename: VITAMINB12,FOLATE,FERRITIN,TIBC,IRON,RETICCTPCT in the last 72 hours Coagulation: No results found for this basename: LABPROT:2,INR:2 in the last 72 hours Urine Drug Screen: Drugs of Abuse     Component Value Date/Time   LABOPIA POSITIVE* 10/28/2007 2113   COCAINSCRNUR NONE DETECTED 10/28/2007 2113   LABBENZ NONE DETECTED 10/28/2007 2113   AMPHETMU NONE DETECTED 10/28/2007 2113   THCU POSITIVE* 10/28/2007 2113   LABBARB  Value: NONE DETECTED        DRUG SCREEN FOR MEDICAL PURPOSES ONLY.  IF CONFIRMATION IS NEEDED FOR ANY PURPOSE, NOTIFY LAB WITHIN 5 DAYS. 10/28/2007 2113    Alcohol Level: No results found for this basename: ETH:2 in the last 72 hours Urinalysis: No results found for this basename: COLORURINE:2,APPERANCEUR:2,LABSPEC:2,PHURINE:2,GLUCOSEU:2,HGBUR:2,BILIRUBINUR:2,KETONESUR:2,PROTEINUR:2,UROBILINOGEN:2,NITRITE:2,LEUKOCYTESUR:2 in the last  72 hours  Recent Results (from the past 240 hour(s))  CULTURE, BLOOD (ROUTINE X 2)     Status: Normal (Preliminary result)   Collection Time   10/16/11  9:43 PM      Component Value Range Status Comment   Specimen Description BLOOD LEFT ANTECUBITAL   Final    Special Requests BOTTLES DRAWN AEROBIC AND ANAEROBIC 18CC EACH   Final    Culture NO GROWTH 3 DAYS   Final     Report Status PENDING   Incomplete   CULTURE, BLOOD (ROUTINE X 2)     Status: Normal (Preliminary result)   Collection Time   10/16/11  9:49 PM      Component Value Range Status Comment   Specimen Description BLOOD RIGHT ANTECUBITAL DRAWN BY RN   Final    Special Requests     Final    Value: BOTTLES DRAWN AEROBIC AND ANAEROBIC AEB=5CC ANA=6CC   Culture NO GROWTH 3 DAYS   Final    Report Status PENDING   Incomplete     Studies/Results: No results found.  Medications: Scheduled Meds:   . albuterol  2.5 mg Nebulization QID  . amLODipine  5 mg Oral Daily  . aspirin  325 mg Oral Daily  . citalopram  20 mg Oral Daily  . fluticasone  1 spray Each Nare Daily  . guaiFENesin-dextromethorphan  5 mL Oral Q4H  . insulin aspart  0-20 Units Subcutaneous TID WC  . insulin aspart  0-5 Units Subcutaneous QHS  . levofloxacin  500 mg Oral Daily  . metFORMIN  500 mg Oral BID WC  . methylPREDNISolone (SOLU-MEDROL) injection  80 mg Intravenous Q6H  . nicotine  14 mg Transdermal Daily  . pantoprazole  40 mg Oral Q1200  . potassium chloride  40 mEq Oral Once  . sodium chloride      . sodium chloride      . sodium chloride      . sodium chloride       Continuous Infusions:  PRN Meds:.albuterol, oxyCODONE-acetaminophen  Assessment/Plan:  Principal Problem:  *PNA (pneumonia) Active Problems:  Microcytic anemia  HTN (hypertension)  DM type 2 (diabetes mellitus, type 2)  Tobacco abuse  COPD (chronic obstructive pulmonary disease)  Chronic respiratory failure  Oxygen dependent  Changing mole right temporal area  Plan:  1. Pneumonia, she is afebrile and on oral levaquin.  Will repeat chest xray  2. COPD with chronic respiratory failure.  Slow progression.  Will continue steroids at current doing for today  3. Symptomatic anemia related to menorrhagia.  Patient was transfused 2 units PRBC yesterday and reports symptomatic improvement.  Plans for outpatient GYN eval  4. Steroid induced  leukocytosis  5. Hyperglycemia related to steroids in setting of diabetes.  Continue to monitor blood sugars  6.  Obesity  7. Dispo.  Not ready for discharge yet.   LOS: 3 days   Aubre Quincy [facility] Pager: [phone] 10/19/2011, 11:48 AM

## 2011-10-20 DIAGNOSIS — R112 Nausea with vomiting, unspecified: Secondary | ICD-10-CM

## 2011-10-20 DIAGNOSIS — J441 Chronic obstructive pulmonary disease with (acute) exacerbation: Secondary | ICD-10-CM

## 2011-10-20 DIAGNOSIS — J159 Unspecified bacterial pneumonia: Secondary | ICD-10-CM

## 2011-10-20 DIAGNOSIS — J9620 Acute and chronic respiratory failure, unspecified whether with hypoxia or hypercapnia: Secondary | ICD-10-CM

## 2011-10-20 LAB — BLOOD GAS, ARTERIAL
Acid-Base Excess: 4.1 mmol/L — ABNORMAL HIGH (ref 0.0–2.0)
Bicarbonate: 28.4 mEq/L — ABNORMAL HIGH (ref 20.0–24.0)
FIO2: 50 %
O2 Saturation: 82.5 %
Patient temperature: 37
TCO2: 26.2 mmol/L (ref 0–100)
pCO2 arterial: 44.3 mmHg (ref 35.0–45.0)
pH, Arterial: 7.422 — ABNORMAL HIGH (ref 7.350–7.400)
pO2, Arterial: 48.9 mmHg — ABNORMAL LOW (ref 80.0–100.0)

## 2011-10-20 LAB — GLUCOSE, CAPILLARY
Glucose-Capillary: 228 mg/dL — ABNORMAL HIGH (ref 70–99)
Glucose-Capillary: 213 mg/dL — ABNORMAL HIGH (ref 70–99)
Glucose-Capillary: 172 mg/dL — ABNORMAL HIGH (ref 70–99)
Glucose-Capillary: 294 mg/dL — ABNORMAL HIGH (ref 70–99)

## 2011-10-20 LAB — CBC
HCT: 32.7 % — ABNORMAL LOW (ref 36.0–46.0)
Hemoglobin: 9.8 g/dL — ABNORMAL LOW (ref 12.0–15.0)
MCH: 20.9 pg — ABNORMAL LOW (ref 26.0–34.0)
MCHC: 30 g/dL (ref 30.0–36.0)
MCV: 69.7 fL — ABNORMAL LOW (ref 78.0–100.0)
Platelets: 248 10*3/uL (ref 150–400)
RBC: 4.69 MIL/uL (ref 3.87–5.11)
RDW: 20.6 % — ABNORMAL HIGH (ref 11.5–15.5)
WBC: 19.2 10*3/uL — ABNORMAL HIGH (ref 4.0–10.5)

## 2011-10-20 LAB — MRSA PCR SCREENING: MRSA by PCR: NEGATIVE

## 2011-10-20 MED ORDER — PIPERACILLIN-TAZOBACTAM 3.375 G IVPB
3.3750 g | Freq: Three times a day (TID) | INTRAVENOUS | Status: DC
  Administered 2011-10-20 – 2011-10-24 (×13): 3.375 g via INTRAVENOUS
  Filled 2011-10-20 (×18): qty 50

## 2011-10-20 MED ORDER — VANCOMYCIN HCL 1000 MG IV SOLR
2000.0000 mg | Freq: Once | INTRAVENOUS | Status: AC
  Administered 2011-10-20: 2000 mg via INTRAVENOUS
  Filled 2011-10-20 (×2): qty 2000

## 2011-10-20 MED ORDER — VANCOMYCIN HCL IN DEXTROSE 1-5 GM/200ML-% IV SOLN
1000.0000 mg | Freq: Two times a day (BID) | INTRAVENOUS | Status: DC
  Administered 2011-10-20 – 2011-10-23 (×6): 1000 mg via INTRAVENOUS
  Filled 2011-10-20 (×6): qty 200

## 2011-10-20 MED ORDER — SODIUM CHLORIDE 0.9 % IJ SOLN
INTRAMUSCULAR | Status: AC
  Filled 2011-10-20: qty 3

## 2011-10-20 MED ORDER — SODIUM CHLORIDE 0.9 % IJ SOLN
INTRAMUSCULAR | Status: AC
  Administered 2011-10-20: 06:00:00
  Filled 2011-10-20: qty 3

## 2011-10-20 MED ORDER — METHYLPREDNISOLONE SODIUM SUCC 125 MG IJ SOLR
125.0000 mg | Freq: Four times a day (QID) | INTRAMUSCULAR | Status: DC
  Administered 2011-10-20 – 2011-10-22 (×8): 125 mg via INTRAVENOUS
  Filled 2011-10-20 (×8): qty 2

## 2011-10-20 NOTE — Progress Notes (Addendum)
Upon entering pt room to bring pain medication, she appeared to be SOB, she said she did feel more winded. Pulse ox checked and it initially read 76% on 4L of O2, I increased the O2 to 5L, it increased to 81%, and notified RT at this time. RT said that she will come assess her, because earlier she was in the upper 90s on 4L. Nursing will continue to monitor. Sheryn Bison  [phone]: I was notified by NT that morning VS showed O2 of 80%. Notified RT, they said to place pt on Venti mask at 50%. This was done, RT came and assessed shortly thereafter, O2 rechecked, at 84% while pt is resting well, snoring. Will continue to monitor. Deberah Castle Warner 0720: RT came to see pt and says that she is still sating at 87% on 50% Venturi mask. Dr. Kerry Hough made aware, ABG ordered, pt is resting comfortably, sitting up in bed. Nursing will monitor. Sheryn Bison

## 2011-10-20 NOTE — Progress Notes (Signed)
Subjective: Patient feels increasingly short of breath today, coughing but non productive.  Objective: Vital signs in last 24 hours: Temp:  [97.7 F (36.5 C)-98.3 F (36.8 C)] 97.7 F (36.5 C) (06/28 0527) Pulse Rate:  [98-100] 100  (06/28 0527) Resp:  [22-24] 24  (06/28 0527) BP: (132-155)/(63-80) 155/75 mmHg (06/28 0527) SpO2:  [80 %-98 %] 87 % (06/28 0718) FiO2 (%):  [50 %] 50 % (06/28 0718) Weight change:  Last BM Date: 10/17/11  Intake/Output from previous day: 06/27 0701 - 06/28 0700 In: 720 [P.O.:720] Out: 850 [Urine:850] Total I/O In: 220 [P.O.:220] Out: -    Physical Exam: General: Alert, awake, oriented x3, has increased work of breathing HEENT: No bruits, no goiter. Heart: Regular rate and rhythm, without murmurs, rubs, gallops. Lungs: b/l exp wheezes. Abdomen: Soft, nontender, nondistended, positive bowel sounds. Extremities: No clubbing cyanosis or edema with positive pedal pulses. Neuro: Grossly intact, nonfocal.    Lab Results: Basic Metabolic Panel:  Basename 10/19/11 0436  NA 140  K 4.1  CL 101  CO2 26  GLUCOSE 193*  BUN 11  CREATININE 0.56  CALCIUM 9.6  MG --  PHOS --   Liver Function Tests: No results found for this basename: AST:2,ALT:2,ALKPHOS:2,BILITOT:2,PROT:2,ALBUMIN:2 in the last 72 hours No results found for this basename: LIPASE:2,AMYLASE:2 in the last 72 hours No results found for this basename: AMMONIA:2 in the last 72 hours CBC:  Basename 10/20/11 0548 10/19/11 0436  WBC 19.2* 23.9*  NEUTROABS -- --  HGB 9.8* 10.6*  HCT 32.7* 35.8*  MCV 69.7* 69.8*  PLT 248 294   Cardiac Enzymes:  Basename 10/18/11 0027  CKTOTAL 51  CKMB 1.6  CKMBINDEX --  TROPONINI <0.30   BNP: No results found for this basename: PROBNP:3 in the last 72 hours D-Dimer: No results found for this basename: DDIMER:2 in the last 72 hours CBG:  Basename 10/20/11 0731 10/19/11 2126 10/19/11 1608 10/19/11 1114 10/19/11 0740 10/18/11 2106  GLUCAP  228* 254* 176* 235* 176* 202*   Hemoglobin A1C: No results found for this basename: HGBA1C in the last 72 hours Fasting Lipid Panel: No results found for this basename: CHOL,HDL,LDLCALC,TRIG,CHOLHDL,LDLDIRECT in the last 72 hours Thyroid Function Tests: No results found for this basename: TSH,T4TOTAL,FREET4,T3FREE,THYROIDAB in the last 72 hours Anemia Panel: No results found for this basename: VITAMINB12,FOLATE,FERRITIN,TIBC,IRON,RETICCTPCT in the last 72 hours Coagulation: No results found for this basename: LABPROT:2,INR:2 in the last 72 hours Urine Drug Screen: Drugs of Abuse     Component Value Date/Time   LABOPIA POSITIVE* 10/28/2007 2113   COCAINSCRNUR NONE DETECTED 10/28/2007 2113   LABBENZ NONE DETECTED 10/28/2007 2113   AMPHETMU NONE DETECTED 10/28/2007 2113   THCU POSITIVE* 10/28/2007 2113   LABBARB  Value: NONE DETECTED        DRUG SCREEN FOR MEDICAL PURPOSES ONLY.  IF CONFIRMATION IS NEEDED FOR ANY PURPOSE, NOTIFY LAB WITHIN 5 DAYS. 10/28/2007 2113    Alcohol Level: No results found for this basename: ETH:2 in the last 72 hours Urinalysis: No results found for this basename: COLORURINE:2,APPERANCEUR:2,LABSPEC:2,PHURINE:2,GLUCOSEU:2,HGBUR:2,BILIRUBINUR:2,KETONESUR:2,PROTEINUR:2,UROBILINOGEN:2,NITRITE:2,LEUKOCYTESUR:2 in the last 72 hours  Recent Results (from the past 240 hour(s))  CULTURE, BLOOD (ROUTINE X 2)     Status: Normal (Preliminary result)   Collection Time   10/16/11  9:43 PM      Component Value Range Status Comment   Specimen Description BLOOD LEFT ANTECUBITAL   Final    Special Requests BOTTLES DRAWN AEROBIC AND ANAEROBIC 18CC EACH   Final    Culture NO  GROWTH 3 DAYS   Final    Report Status PENDING   Incomplete   CULTURE, BLOOD (ROUTINE X 2)     Status: Normal (Preliminary result)   Collection Time   10/16/11  9:49 PM      Component Value Range Status Comment   Specimen Description BLOOD RIGHT ANTECUBITAL DRAWN BY RN   Final    Special Requests     Final     Value: BOTTLES DRAWN AEROBIC AND ANAEROBIC AEB=5CC ANA=6CC   Culture NO GROWTH 3 DAYS   Final    Report Status PENDING   Incomplete     Studies/Results: Dg Chest 2 View  10/19/2011  *RADIOLOGY REPORT*  Clinical Data: Pneumonia and COPD  CHEST - 2 VIEW  Comparison: 10/16/2011  Findings: Heart and mediastinal contours are stable.  There has been interval progression of the bilateral patchy alveolar infiltrates which are now more diffuse in nature in the right middle and upper lobes.  Slightly more prominent patchy infiltrates are seen in the left upper lobe and left mid and lower lung zones. No pleural fluid is seen.  Bony structures are stable.  IMPRESSION: Progressing bilateral interstitial infiltrates most compatible with progressing pneumonia. These results will be called to the ordering clinician or representative by the Radiologist Assistant, and communication documented in the PACS Dashboard.  Original Report Authenticated By: Bertha Stakes, M.D.    Medications: Scheduled Meds:   . albuterol  2.5 mg Nebulization QID  . amLODipine  5 mg Oral Daily  . aspirin  325 mg Oral Daily  . citalopram  20 mg Oral Daily  . fluticasone  1 spray Each Nare Daily  . Fluticasone-Salmeterol  1 puff Inhalation BID  . guaiFENesin  600 mg Oral BID  . guaiFENesin-dextromethorphan  5 mL Oral Q4H  . insulin aspart  0-20 Units Subcutaneous TID WC  . insulin aspart  0-5 Units Subcutaneous QHS  . levofloxacin  500 mg Oral Daily  . metFORMIN  500 mg Oral BID WC  . methylPREDNISolone (SOLU-MEDROL) injection  80 mg Intravenous Q6H  . nicotine  14 mg Transdermal Daily  . pantoprazole  40 mg Oral Q1200  . piperacillin-tazobactam (ZOSYN)  IV  3.375 g Intravenous Q8H  . sodium chloride      . tiotropium  18 mcg Inhalation Daily  . vancomycin  2,000 mg Intravenous Once  . vancomycin  1,000 mg Intravenous Q12H   Continuous Infusions:  PRN Meds:.albuterol,  oxyCODONE-acetaminophen  Assessment/Plan:  Principal Problem:  *PNA (pneumonia) Active Problems:  Microcytic anemia  HTN (hypertension)  DM type 2 (diabetes mellitus, type 2)  Tobacco abuse  COPD (chronic obstructive pulmonary disease)  Chronic respiratory failure  Oxygen dependent  Changing mole right temporal area  Plan:  Acute on chronic respiratory failure due to pneumonia.  Patient's xray shows worsening pneumonia despite being on levaquin.  Will broaden antibiotic coverage to vancomycin and zosyn.  Patient is currently on 50% VM.  She will need transfer down to step down unit for closer monitoring. Will request pulmonary consultation  Pneumonia, on abx as above, clinically worse.  COPD.  Will increase steroids to 125mg  q6h.  Continue bronchodilators and abx    LOS: 4 days   Sydney Hasten [facility] Pager: [phone] 10/20/2011, 11:25 AM

## 2011-10-20 NOTE — Consult Note (Signed)
NAMEADDYLYNN, [name]            ACCOUNT NO.:  [id number]  MEDICAL RECORD NO.:  [id number]  LOCATION:  IC06                          FACILITY:  [facility]  PHYSICIAN:  Dnyla Antonetti L. Juanetta Gosling, M.D.DATE OF BIRTH:  [birth year]  DATE OF CONSULTATION: DATE OF DISCHARGE:                                CONSULTATION   Patient of inpatient hospitalist and of Dr. Jeanice Lim.  REASON FOR CONSULTATION:  Pneumonia.  HISTORY:  [name] is a 46 year old who was admitted to the hospital on the 24th with pneumonia.  She has a known history of oxygen-dependent COPD at home.  She developed cough just 2 days prior to admission.  She eventually came to the emergency room and was admitted from there with pneumonia.  She had initially improved and then today it became much worse with a lot of cough and congestion or short of breath.  She is currently on a non-rebreather mask.  She is being moved to the step-down unit.  PAST MEDICAL HISTORY:  Positive for multiple problems including COPD for which she takes oxygen at home, continued tobacco abuse, diabetes, hypertension, chronic low back pain, depression, anxiety, history of peptic ulcer disease, chronic respiratory failure.  PAST SURGICAL HISTORY:  Surgically, she has had a cholecystectomy, sphincterotomy, tubal ligation, tonsillectomy and wrist surgery .  FAMILY HISTORY:  Positive for heart disease in several family members, diabetes in several family members and some COPD.  REVIEW OF SYSTEMS:  Except as mentioned is negative.  SOCIAL HISTORY:  She continues to smoke cigarettes.  PHYSICAL EXAMINATION:  GENERAL:  Well-developed, obese female, who has a non-rebreather mask on and looks pretty short of breath at rest. HEENT:  Her pupils are reactive.  Her nose and throat are clear. NECK:  Supple without masses. CHEST:  Decreased breath sounds at bilateral rhonchi. HEART:  Regular without gallop. ABDOMEN:  Soft, obese without masses. EXTREMITIES:   Peripheral pulses.  No clubbing. CENTRAL NERVOUS SYSTEM:  She appears to be anxious, but otherwise unremarkable.  Chest x-ray is pending.  ASSESSMENT:  Then is that, she came in with pneumonia to be treated with standard community-acquired pneumonia treatment and since then she has worse.  She is now on triple antibiotic coverage which I certainly agree with IV steroids, non-rebreather nebulized bronchodilators.  PLAN:  I will plan to continue with her current treatment.  I agree with plans at this point.  She could use BiPAP if needed.     Jamen Loiseau L. Juanetta Gosling, M.D.     ELH/MEDQ  D:  10/20/2011  T:  10/20/2011  Job:  [id number]

## 2011-10-20 NOTE — Consult Note (Signed)
Full note to follow. Agree with triple antibiotics and steroids.

## 2011-10-20 NOTE — Progress Notes (Signed)
UR Chart Review Completed  

## 2011-10-20 NOTE — Progress Notes (Signed)
ANTIBIOTIC CONSULT NOTE - INITIAL  Pharmacy Consult for Vancomcyin & Zosyn Indication: pneumonia and rule out pneumonia  Allergies  Allergen Reactions  . Codeine Itching and Nausea Only  . Wellbutrin (Bupropion Hcl) Hives    Patient Measurements: Height: 5\' 4"  (162.6 cm) Weight: 255 lb 1.2 oz (115.7 kg) IBW/kg (Calculated) : 54.7   Vital Signs: Temp: 97.7 F (36.5 C) (06/28 0527) BP: 155/75 mmHg (06/28 0527) Pulse Rate: 100  (06/28 0527) Intake/Output from previous day: 06/27 0701 - 06/28 0700 In: 720 [P.O.:720] Out: 850 [Urine:850] Intake/Output from this shift:    Labs:  Basename 10/20/11 0548 10/19/11 0436  WBC 19.2* 23.9*  HGB 9.8* 10.6*  PLT 248 294  LABCREA -- --  CREATININE -- 0.56   Estimated Creatinine Clearance: 110.9 ml/min (by C-G formula based on Cr of 0.56). No results found for this basename: VANCOTROUGH:2,VANCOPEAK:2,VANCORANDOM:2,GENTTROUGH:2,GENTPEAK:2,GENTRANDOM:2,TOBRATROUGH:2,TOBRAPEAK:2,TOBRARND:2,AMIKACINPEAK:2,AMIKACINTROU:2,AMIKACIN:2, in the last 72 hours   Microbiology: Recent Results (from the past 720 hour(s))  CULTURE, BLOOD (ROUTINE X 2)     Status: Normal (Preliminary result)   Collection Time   10/16/11  9:43 PM      Component Value Range Status Comment   Specimen Description BLOOD LEFT ANTECUBITAL   Final    Special Requests BOTTLES DRAWN AEROBIC AND ANAEROBIC 18CC EACH   Final    Culture NO GROWTH 3 DAYS   Final    Report Status PENDING   Incomplete   CULTURE, BLOOD (ROUTINE X 2)     Status: Normal (Preliminary result)   Collection Time   10/16/11  9:49 PM      Component Value Range Status Comment   Specimen Description BLOOD RIGHT ANTECUBITAL DRAWN BY RN   Final    Special Requests     Final    Value: BOTTLES DRAWN AEROBIC AND ANAEROBIC AEB=5CC ANA=6CC   Culture NO GROWTH 3 DAYS   Final    Report Status PENDING   Incomplete     Medical History: Past Medical History  Diagnosis Date  . Diabetes mellitus   . COPD (chronic  obstructive pulmonary disease)   . HTN (hypertension)   . Low back pain   . Tachycardia     never had test done since no insurance  . Depression   . Asthma   . Shortness of breath   . Anxiety   . Gastric erosions     EGD 08/2010.  . Internal hemorrhoids     Colonoscopy 5/12.  [name] Heavy menses   . Chronic respiratory failure with hypoxia     On 2-3 L of oxygen at home    Medications:  Scheduled:    . albuterol  2.5 mg Nebulization QID  . amLODipine  5 mg Oral Daily  . aspirin  325 mg Oral Daily  . citalopram  20 mg Oral Daily  . fluticasone  1 spray Each Nare Daily  . Fluticasone-Salmeterol  1 puff Inhalation BID  . guaiFENesin  600 mg Oral BID  . guaiFENesin-dextromethorphan  5 mL Oral Q4H  . insulin aspart  0-20 Units Subcutaneous TID WC  . insulin aspart  0-5 Units Subcutaneous QHS  . levofloxacin  500 mg Oral Daily  . metFORMIN  500 mg Oral BID WC  . methylPREDNISolone (SOLU-MEDROL) injection  80 mg Intravenous Q6H  . nicotine  14 mg Transdermal Daily  . pantoprazole  40 mg Oral Q1200  . sodium chloride      . tiotropium  18 mcg Inhalation Daily   Assessment: 46 yo  F admitted with COPD exacerbation is currently on oral Levaquin. Her CXR shows worsening PNA and she continues to have difficultly breathing, cough.   Her renal function is good and blood cx have been negative so far.   Goal of Therapy:  Vancomycin trough level 15-20 mcg/ml  Plan:  1) Zosyn 3.375gm IV Q8h to be infused over 4hrs 2) Vancomycin 2000mg  IV x 1 now, then 1000mg  IV q12h 3) Check Vancomycin trough at steady state 4) Monitor renal function and cx data  5) *Consider d/c Levaquin since patient now on Vanc/Zosyn.   Elson Clan 10/20/2011,7:52 AM

## 2011-10-20 NOTE — Progress Notes (Signed)
Inpatient Diabetes Program Recommendations  AACE/ADA: New Consensus Statement on Inpatient Glycemic Control  Target Ranges:  Prepandial:   less than 140 mg/dL      Peak postprandial:   less than 180 mg/dL (1-2 hours)      Critically ill patients:  140 - 180 mg/dL  Pager:  [phone] Hours:  8 am-10pm   Reason for Visit:  Elevated glucose in 200s  Inpatient Diabetes Program Recommendations Insulin - Basal: Add Lantus while on IV steroids:  Lantus 15 units qhs    Alfredia Client PhD, RN Diabetes Coordinator  Office:  [phone] Team Pager:  [phone]

## 2011-10-21 DIAGNOSIS — J159 Unspecified bacterial pneumonia: Secondary | ICD-10-CM

## 2011-10-21 DIAGNOSIS — R112 Nausea with vomiting, unspecified: Secondary | ICD-10-CM

## 2011-10-21 DIAGNOSIS — J441 Chronic obstructive pulmonary disease with (acute) exacerbation: Secondary | ICD-10-CM

## 2011-10-21 DIAGNOSIS — J9620 Acute and chronic respiratory failure, unspecified whether with hypoxia or hypercapnia: Secondary | ICD-10-CM

## 2011-10-21 LAB — CULTURE, BLOOD (ROUTINE X 2)
Culture: NO GROWTH
Culture: NO GROWTH

## 2011-10-21 LAB — CBC
HCT: 32.3 % — ABNORMAL LOW (ref 36.0–46.0)
Hemoglobin: 9.7 g/dL — ABNORMAL LOW (ref 12.0–15.0)
MCH: 22 pg — ABNORMAL LOW (ref 26.0–34.0)
MCHC: 30 g/dL (ref 30.0–36.0)
MCV: 73.4 fL — ABNORMAL LOW (ref 78.0–100.0)
Platelets: 254 10*3/uL (ref 150–400)
RBC: 4.4 MIL/uL (ref 3.87–5.11)
RDW: 22.4 % — ABNORMAL HIGH (ref 11.5–15.5)
WBC: 17.1 10*3/uL — ABNORMAL HIGH (ref 4.0–10.5)

## 2011-10-21 LAB — BASIC METABOLIC PANEL
BUN: 11 mg/dL (ref 6–23)
CO2: 31 mEq/L (ref 19–32)
Calcium: 9.1 mg/dL (ref 8.4–10.5)
Chloride: 97 mEq/L (ref 96–112)
Creatinine, Ser: 0.52 mg/dL (ref 0.50–1.10)
GFR calc Af Amer: >90 mL/min (ref >90)
GFR calc non Af Amer: >90 mL/min (ref >90)
Glucose, Bld: 211 mg/dL — ABNORMAL HIGH (ref 70–99)
Potassium: 3.9 mEq/L (ref 3.5–5.1)
Sodium: 137 mEq/L (ref 135–145)

## 2011-10-21 LAB — GLUCOSE, CAPILLARY
Glucose-Capillary: 213 mg/dL — ABNORMAL HIGH (ref 70–99)
Glucose-Capillary: 236 mg/dL — ABNORMAL HIGH (ref 70–99)
Glucose-Capillary: 186 mg/dL — ABNORMAL HIGH (ref 70–99)
Glucose-Capillary: 320 mg/dL — ABNORMAL HIGH (ref 70–99)

## 2011-10-21 NOTE — Progress Notes (Signed)
Subjective: Patient is feeling better today, chest feels less tight today, she is coughing.  She has been on a nonbreather overnight.  Currently she is on a 50% VM.  Objective: Vital signs in last 24 hours: Temp:  [97.2 F (36.2 C)-98.5 F (36.9 C)] 97.2 F (36.2 C) (06/29 0737) Pulse Rate:  [76-101] 101  (06/29 0800) Resp:  [18-25] 21  (06/29 0800) BP: (132-167)/(59-108) 155/72 mmHg (06/29 0800) SpO2:  [85 %-99 %] 88 % (06/29 0800) FiO2 (%):  [50 %-100 %] 100 % (06/29 0800) Weight:  [120.4 kg (265 lb 6.9 oz)] 120.4 kg (265 lb 6.9 oz) (06/29 0500) Weight change:  Last BM Date: 10/20/11  Intake/Output from previous day: 06/28 0701 - 06/29 0700 In: 1730 [P.O.:1580; IV Piggyback:150] Out: -  Total I/O In: -  Out: 1000 [Urine:1000]   Physical Exam: General: Alert, awake, oriented x3, in no acute distress. HEENT: No bruits, no goiter. Heart: Regular rate and rhythm, without murmurs, rubs, gallops. Lungs: b/l exp wheezes, improving since yesterday Abdomen: Soft, nontender, nondistended, positive bowel sounds. Extremities: No clubbing cyanosis or edema with positive pedal pulses. Neuro: Grossly intact, nonfocal.    Lab Results: Basic Metabolic Panel:  Basename 10/21/11 0441 10/19/11 0436  NA 137 140  K 3.9 4.1  CL 97 101  CO2 31 26  GLUCOSE 211* 193*  BUN 11 11  CREATININE 0.52 0.56  CALCIUM 9.1 9.6  MG -- --  PHOS -- --   Liver Function Tests: No results found for this basename: AST:2,ALT:2,ALKPHOS:2,BILITOT:2,PROT:2,ALBUMIN:2 in the last 72 hours No results found for this basename: LIPASE:2,AMYLASE:2 in the last 72 hours No results found for this basename: AMMONIA:2 in the last 72 hours CBC:  Basename 10/21/11 0441 10/20/11 0548  WBC 17.1* 19.2*  NEUTROABS -- --  HGB 9.7* 9.8*  HCT 32.3* 32.7*  MCV 73.4* 69.7*  PLT 254 248   Cardiac Enzymes: No results found for this basename: CKTOTAL:3,CKMB:3,CKMBINDEX:3,TROPONINI:3 in the last 72 hours BNP: No  results found for this basename: PROBNP:3 in the last 72 hours D-Dimer: No results found for this basename: DDIMER:2 in the last 72 hours CBG:  Basename 10/21/11 0735 10/20/11 2113 10/20/11 1630 10/20/11 1220 10/20/11 0731 10/19/11 2126  GLUCAP 213* 294* 172* 213* 228* 254*   Hemoglobin A1C: No results found for this basename: HGBA1C in the last 72 hours Fasting Lipid Panel: No results found for this basename: CHOL,HDL,LDLCALC,TRIG,CHOLHDL,LDLDIRECT in the last 72 hours Thyroid Function Tests: No results found for this basename: TSH,T4TOTAL,FREET4,T3FREE,THYROIDAB in the last 72 hours Anemia Panel: No results found for this basename: VITAMINB12,FOLATE,FERRITIN,TIBC,IRON,RETICCTPCT in the last 72 hours Coagulation: No results found for this basename: LABPROT:2,INR:2 in the last 72 hours Urine Drug Screen: Drugs of Abuse     Component Value Date/Time   LABOPIA POSITIVE* 10/28/2007 2113   COCAINSCRNUR NONE DETECTED 10/28/2007 2113   LABBENZ NONE DETECTED 10/28/2007 2113   AMPHETMU NONE DETECTED 10/28/2007 2113   THCU POSITIVE* 10/28/2007 2113   LABBARB  Value: NONE DETECTED        DRUG SCREEN FOR MEDICAL PURPOSES ONLY.  IF CONFIRMATION IS NEEDED FOR ANY PURPOSE, NOTIFY LAB WITHIN 5 DAYS. 10/28/2007 2113    Alcohol Level: No results found for this basename: ETH:2 in the last 72 hours Urinalysis: No results found for this basename: COLORURINE:2,APPERANCEUR:2,LABSPEC:2,PHURINE:2,GLUCOSEU:2,HGBUR:2,BILIRUBINUR:2,KETONESUR:2,PROTEINUR:2,UROBILINOGEN:2,NITRITE:2,LEUKOCYTESUR:2 in the last 72 hours  Recent Results (from the past 240 hour(s))  CULTURE, BLOOD (ROUTINE X 2)     Status: Normal   Collection Time   10/16/11  9:43 PM      Component Value Range Status Comment   Specimen Description BLOOD LEFT ANTECUBITAL   Final    Special Requests BOTTLES DRAWN AEROBIC AND ANAEROBIC 18CC EACH   Final    Culture NO GROWTH 5 DAYS   Final    Report Status 10/21/2011 FINAL   Final   CULTURE, BLOOD (ROUTINE  X 2)     Status: Normal   Collection Time   10/16/11  9:49 PM      Component Value Range Status Comment   Specimen Description BLOOD RIGHT ANTECUBITAL DRAWN BY RN   Final    Special Requests     Final    Value: BOTTLES DRAWN AEROBIC AND ANAEROBIC AEB=5CC ANA=6CC   Culture NO GROWTH 5 DAYS   Final    Report Status 10/21/2011 FINAL   Final   MRSA PCR SCREENING     Status: Normal   Collection Time   10/20/11  3:51 PM      Component Value Range Status Comment   MRSA by PCR NEGATIVE  NEGATIVE Final     Studies/Results: Dg Chest 2 View  10/19/2011  *RADIOLOGY REPORT*  Clinical Data: Pneumonia and COPD  CHEST - 2 VIEW  Comparison: 10/16/2011  Findings: Heart and mediastinal contours are stable.  There has been interval progression of the bilateral patchy alveolar infiltrates which are now more diffuse in nature in the right middle and upper lobes.  Slightly more prominent patchy infiltrates are seen in the left upper lobe and left mid and lower lung zones. No pleural fluid is seen.  Bony structures are stable.  IMPRESSION: Progressing bilateral interstitial infiltrates most compatible with progressing pneumonia. These results will be called to the ordering clinician or representative by the Radiologist Assistant, and communication documented in the PACS Dashboard.  Original Report Authenticated By: Bertha Stakes, M.D.    Medications: Scheduled Meds:   . albuterol  2.5 mg Nebulization QID  . amLODipine  5 mg Oral Daily  . aspirin  325 mg Oral Daily  . citalopram  20 mg Oral Daily  . fluticasone  1 spray Each Nare Daily  . Fluticasone-Salmeterol  1 puff Inhalation BID  . guaiFENesin  600 mg Oral BID  . guaiFENesin-dextromethorphan  5 mL Oral Q4H  . insulin aspart  0-20 Units Subcutaneous TID WC  . insulin aspart  0-5 Units Subcutaneous QHS  . levofloxacin  500 mg Oral Daily  . metFORMIN  500 mg Oral BID WC  . methylPREDNISolone (SOLU-MEDROL) injection  125 mg Intravenous Q6H  .  nicotine  14 mg Transdermal Daily  . pantoprazole  40 mg Oral Q1200  . piperacillin-tazobactam (ZOSYN)  IV  3.375 g Intravenous Q8H  . sodium chloride      . tiotropium  18 mcg Inhalation Daily  . vancomycin  2,000 mg Intravenous Once  . vancomycin  1,000 mg Intravenous Q12H  . DISCONTD: methylPREDNISolone (SOLU-MEDROL) injection  80 mg Intravenous Q6H   Continuous Infusions:  PRN Meds:.albuterol, oxyCODONE-acetaminophen  Assessment/Plan:  Principal Problem:  *PNA (pneumonia) Active Problems:  Microcytic anemia  HTN (hypertension)  DM type 2 (diabetes mellitus, type 2)  Tobacco abuse  COPD (chronic obstructive pulmonary disease)  Chronic respiratory failure  Oxygen dependent  Changing mole right temporal area  Plan:  1. Acute on chronic respiratory failure due to pneumonia. Patient has been placed on triple abx.  Appreciate Dr. Juanetta Gosling assistance.  Will try and wean down oxygen as tolerated.  2. Pneumonia.  Patient has worsening infiltrates on chest xray.  Her abx were escalated yesterday.  Will continue current regimen for now. Check sputum culture  3. Oxygen dependent COPD.  On high dose steroids.  She has had mild improvement today.  Continue current dose of steroids for now.  4. DM2, on sliding scale insulin  5. Dispo.  Continue to monitor in SDU for now   LOS: 5 days   Khamauri Bauernfeind [facility] Pager: [phone] 10/21/2011, 9:46 AM

## 2011-10-21 NOTE — Progress Notes (Signed)
Subjective: She looks much better. She did continue requiring a nonrebreather mask through most of the night but now she's on a Ventimask and seems to be doing okay. She still coughing but not able to cough up much of anything. She says she's less short of breath.  Objective: Vital signs in last 24 hours: Temp:  [97.2 F (36.2 C)-98.5 F (36.9 C)] 97.2 F (36.2 C) (06/29 0737) Pulse Rate:  [76-101] 101  (06/29 0800) Resp:  [18-25] 21  (06/29 0800) BP: (132-167)/(59-108) 155/72 mmHg (06/29 0800) SpO2:  [85 %-99 %] 88 % (06/29 0800) FiO2 (%):  [50 %-100 %] 100 % (06/29 0800) Weight:  [120.4 kg (265 lb 6.9 oz)] 120.4 kg (265 lb 6.9 oz) (06/29 0500) Weight change:  Last BM Date: 10/20/11  Intake/Output from previous day: 06/28 0701 - 06/29 0700 In: 1730 [P.O.:1580; IV Piggyback:150] Out: -   PHYSICAL EXAM General appearance: alert, cooperative, mild distress and morbidly obese Resp: rhonchi bilaterally Cardio: regular rate and rhythm, S1, S2 normal, no murmur, click, rub or gallop GI: soft, non-tender; bowel sounds normal; no masses,  no organomegaly Extremities: extremities normal, atraumatic, no cyanosis or edema  Lab Results:    Basic Metabolic Panel:  Basename 10/21/11 0441 10/19/11 0436  NA 137 140  K 3.9 4.1  CL 97 101  CO2 31 26  GLUCOSE 211* 193*  BUN 11 11  CREATININE 0.52 0.56  CALCIUM 9.1 9.6  MG -- --  PHOS -- --   Liver Function Tests: No results found for this basename: AST:2,ALT:2,ALKPHOS:2,BILITOT:2,PROT:2,ALBUMIN:2 in the last 72 hours No results found for this basename: LIPASE:2,AMYLASE:2 in the last 72 hours No results found for this basename: AMMONIA:2 in the last 72 hours CBC:  Basename 10/21/11 0441 10/20/11 0548  WBC 17.1* 19.2*  NEUTROABS -- --  HGB 9.7* 9.8*  HCT 32.3* 32.7*  MCV 73.4* 69.7*  PLT 254 248   Cardiac Enzymes: No results found for this basename: CKTOTAL:3,CKMB:3,CKMBINDEX:3,TROPONINI:3 in the last 72 hours BNP: No  results found for this basename: PROBNP:3 in the last 72 hours D-Dimer: No results found for this basename: DDIMER:2 in the last 72 hours CBG:  Basename 10/21/11 0735 10/20/11 2113 10/20/11 1630 10/20/11 1220 10/20/11 0731 10/19/11 2126  GLUCAP 213* 294* 172* 213* 228* 254*   Hemoglobin A1C: No results found for this basename: HGBA1C in the last 72 hours Fasting Lipid Panel: No results found for this basename: CHOL,HDL,LDLCALC,TRIG,CHOLHDL,LDLDIRECT in the last 72 hours Thyroid Function Tests: No results found for this basename: TSH,T4TOTAL,FREET4,T3FREE,THYROIDAB in the last 72 hours Anemia Panel: No results found for this basename: VITAMINB12,FOLATE,FERRITIN,TIBC,IRON,RETICCTPCT in the last 72 hours Coagulation: No results found for this basename: LABPROT:2,INR:2 in the last 72 hours Urine Drug Screen: Drugs of Abuse     Component Value Date/Time   LABOPIA POSITIVE* 10/28/2007 2113   COCAINSCRNUR NONE DETECTED 10/28/2007 2113   LABBENZ NONE DETECTED 10/28/2007 2113   AMPHETMU NONE DETECTED 10/28/2007 2113   THCU POSITIVE* 10/28/2007 2113   LABBARB  Value: NONE DETECTED        DRUG SCREEN FOR MEDICAL PURPOSES ONLY.  IF CONFIRMATION IS NEEDED FOR ANY PURPOSE, NOTIFY LAB WITHIN 5 DAYS. 10/28/2007 2113    Alcohol Level: No results found for this basename: ETH:2 in the last 72 hours Urinalysis: No results found for this basename: COLORURINE:2,APPERANCEUR:2,LABSPEC:2,PHURINE:2,GLUCOSEU:2,HGBUR:2,BILIRUBINUR:2,KETONESUR:2,PROTEINUR:2,UROBILINOGEN:2,NITRITE:2,LEUKOCYTESUR:2 in the last 72 hours Misc. Labs:  ABGS  Basename 10/20/11 0755  PHART 7.422*  PO2ART 48.9*  TCO2 26.2  HCO3 28.4*   CULTURES  Recent Results (from the past 240 hour(s))  CULTURE, BLOOD (ROUTINE X 2)     Status: Normal   Collection Time   10/16/11  9:43 PM      Component Value Range Status Comment   Specimen Description BLOOD LEFT ANTECUBITAL   Final    Special Requests BOTTLES DRAWN AEROBIC AND ANAEROBIC 18CC EACH    Final    Culture NO GROWTH 5 DAYS   Final    Report Status 10/21/2011 FINAL   Final   CULTURE, BLOOD (ROUTINE X 2)     Status: Normal   Collection Time   10/16/11  9:49 PM      Component Value Range Status Comment   Specimen Description BLOOD RIGHT ANTECUBITAL DRAWN BY RN   Final    Special Requests     Final    Value: BOTTLES DRAWN AEROBIC AND ANAEROBIC AEB=5CC ANA=6CC   Culture NO GROWTH 5 DAYS   Final    Report Status 10/21/2011 FINAL   Final   MRSA PCR SCREENING     Status: Normal   Collection Time   10/20/11  3:51 PM      Component Value Range Status Comment   MRSA by PCR NEGATIVE  NEGATIVE Final    Studies/Results: Dg Chest 2 View  10/19/2011  *RADIOLOGY REPORT*  Clinical Data: Pneumonia and COPD  CHEST - 2 VIEW  Comparison: 10/16/2011  Findings: Heart and mediastinal contours are stable.  There has been interval progression of the bilateral patchy alveolar infiltrates which are now more diffuse in nature in the right middle and upper lobes.  Slightly more prominent patchy infiltrates are seen in the left upper lobe and left mid and lower lung zones. No pleural fluid is seen.  Bony structures are stable.  IMPRESSION: Progressing bilateral interstitial infiltrates most compatible with progressing pneumonia. These results will be called to the ordering clinician or representative by the Radiologist Assistant, and communication documented in the PACS Dashboard.  Original Report Authenticated By: Bertha Stakes, M.D.    Medications:  Scheduled:   . albuterol  2.5 mg Nebulization QID  . amLODipine  5 mg Oral Daily  . aspirin  325 mg Oral Daily  . citalopram  20 mg Oral Daily  . fluticasone  1 spray Each Nare Daily  . Fluticasone-Salmeterol  1 puff Inhalation BID  . guaiFENesin  600 mg Oral BID  . guaiFENesin-dextromethorphan  5 mL Oral Q4H  . insulin aspart  0-20 Units Subcutaneous TID WC  . insulin aspart  0-5 Units Subcutaneous QHS  . levofloxacin  500 mg Oral Daily  .  metFORMIN  500 mg Oral BID WC  . methylPREDNISolone (SOLU-MEDROL) injection  125 mg Intravenous Q6H  . nicotine  14 mg Transdermal Daily  . pantoprazole  40 mg Oral Q1200  . piperacillin-tazobactam (ZOSYN)  IV  3.375 g Intravenous Q8H  . sodium chloride      . tiotropium  18 mcg Inhalation Daily  . vancomycin  2,000 mg Intravenous Once  . vancomycin  1,000 mg Intravenous Q12H  . DISCONTD: methylPREDNISolone (SOLU-MEDROL) injection  80 mg Intravenous Q6H   Continuous:  WUJ:[id number], oxyCODONE-acetaminophen  Assesment: She has pneumonia. She has COPD. She developed acute respiratory failure with marked respiratory distress yesterday but has improved markedly with changing her antibiotics and being on IV steroids. Her oxygen requirement has decreased and she looks better in general. She still has congestion in her chest but it is not as bad Principal Problem:  *PNA (  pneumonia) Active Problems:  Microcytic anemia  HTN (hypertension)  DM type 2 (diabetes mellitus, type 2)  Tobacco abuse  COPD (chronic obstructive pulmonary disease)  Chronic respiratory failure  Oxygen dependent  Changing mole right temporal area    Plan: Continue current treatments. She's going to have a flutter valve. We will continue trying to taper her oxygen.    LOS: 5 days   Reinaldo Helt L 10/21/2011, 10:13 AM

## 2011-10-22 DIAGNOSIS — J441 Chronic obstructive pulmonary disease with (acute) exacerbation: Secondary | ICD-10-CM

## 2011-10-22 DIAGNOSIS — J9620 Acute and chronic respiratory failure, unspecified whether with hypoxia or hypercapnia: Secondary | ICD-10-CM

## 2011-10-22 DIAGNOSIS — R112 Nausea with vomiting, unspecified: Secondary | ICD-10-CM

## 2011-10-22 DIAGNOSIS — J159 Unspecified bacterial pneumonia: Secondary | ICD-10-CM

## 2011-10-22 LAB — CBC
HCT: 32.2 % — ABNORMAL LOW (ref 36.0–46.0)
Hemoglobin: 9.7 g/dL — ABNORMAL LOW (ref 12.0–15.0)
MCH: 22.4 pg — ABNORMAL LOW (ref 26.0–34.0)
MCHC: 30.1 g/dL (ref 30.0–36.0)
MCV: 74.2 fL — ABNORMAL LOW (ref 78.0–100.0)
Platelets: 256 10*3/uL (ref 150–400)
RBC: 4.34 MIL/uL (ref 3.87–5.11)
RDW: 23 % — ABNORMAL HIGH (ref 11.5–15.5)
WBC: 12.2 10*3/uL — ABNORMAL HIGH (ref 4.0–10.5)

## 2011-10-22 LAB — BASIC METABOLIC PANEL
BUN: 14 mg/dL (ref 6–23)
CO2: 32 mEq/L (ref 19–32)
Calcium: 9.1 mg/dL (ref 8.4–10.5)
Chloride: 96 mEq/L (ref 96–112)
Creatinine, Ser: 0.52 mg/dL (ref 0.50–1.10)
GFR calc Af Amer: >90 mL/min (ref >90)
GFR calc non Af Amer: >90 mL/min (ref >90)
Glucose, Bld: 233 mg/dL — ABNORMAL HIGH (ref 70–99)
Potassium: 3.9 mEq/L (ref 3.5–5.1)
Sodium: 136 mEq/L (ref 135–145)

## 2011-10-22 LAB — GLUCOSE, CAPILLARY
Glucose-Capillary: 237 mg/dL — ABNORMAL HIGH (ref 70–99)
Glucose-Capillary: 210 mg/dL — ABNORMAL HIGH (ref 70–99)
Glucose-Capillary: 228 mg/dL — ABNORMAL HIGH (ref 70–99)
Glucose-Capillary: 327 mg/dL — ABNORMAL HIGH (ref 70–99)

## 2011-10-22 LAB — EXPECTORATED SPUTUM ASSESSMENT W REFEX TO RESP CULTURE

## 2011-10-22 MED ORDER — SODIUM CHLORIDE 0.9 % IJ SOLN
INTRAMUSCULAR | Status: AC
  Filled 2011-10-22: qty 3

## 2011-10-22 MED ORDER — PIPERACILLIN-TAZOBACTAM 3.375 G IVPB
INTRAVENOUS | Status: AC
  Filled 2011-10-22: qty 50

## 2011-10-22 MED ORDER — METHYLPREDNISOLONE SODIUM SUCC 125 MG IJ SOLR
80.0000 mg | Freq: Four times a day (QID) | INTRAMUSCULAR | Status: DC
  Administered 2011-10-22 – 2011-10-24 (×9): 80 mg via INTRAVENOUS
  Filled 2011-10-22 (×9): qty 2

## 2011-10-22 NOTE — Progress Notes (Signed)
Subjective: Patient reports feeling better.  She was able to sleep last night on the 50% VM and did not require the NRB.  Currently she in on a high flow nasal cannula.  She is coughing up sputum. She is still wheezing.    Objective: Vital signs in last 24 hours: Temp:  [97.4 F (36.3 C)-98.7 F (37.1 C)] 97.4 F (36.3 C) (06/30 0800) Pulse Rate:  [66-97] 91  (06/30 0900) Resp:  [17-23] 23  (06/30 0900) BP: (145-168)/(59-78) 155/68 mmHg (06/30 0800) SpO2:  [82 %-95 %] 94 % (06/30 0900) FiO2 (%):  [40 %-50 %] 50 % (06/30 0800) Weight change:  Last BM Date: 10/22/11  Intake/Output from previous day: 06/29 0701 - 06/30 0700 In: 1550 [P.O.:800; IV Piggyback:750] Out: 2850 [Urine:2850] Total I/O In: 500 [P.O.:250; IV Piggyback:250] Out: 150 [Urine:150]   Physical Exam: General: Alert, awake, oriented x3, in no acute distress.  HEENT: No bruits, no goiter. Heart: Regular rate and rhythm, without murmurs, rubs, gallops. Lungs: b/l exp wheezes Abdomen: Soft, nontender, nondistended, positive bowel sounds. Extremities: No clubbing cyanosis or edema with positive pedal pulses. Neuro: Grossly intact, nonfocal.    Lab Results: Basic Metabolic Panel:  Basename 10/22/11 0513 10/21/11 0441  NA 136 137  K 3.9 3.9  CL 96 97  CO2 32 31  GLUCOSE 233* 211*  BUN 14 11  CREATININE 0.52 0.52  CALCIUM 9.1 9.1  MG -- --  PHOS -- --   Liver Function Tests: No results found for this basename: AST:2,ALT:2,ALKPHOS:2,BILITOT:2,PROT:2,ALBUMIN:2 in the last 72 hours No results found for this basename: LIPASE:2,AMYLASE:2 in the last 72 hours No results found for this basename: AMMONIA:2 in the last 72 hours CBC:  Basename 10/22/11 0513 10/21/11 0441  WBC 12.2* 17.1*  NEUTROABS -- --  HGB 9.7* 9.7*  HCT 32.2* 32.3*  MCV 74.2* 73.4*  PLT 256 254   Cardiac Enzymes: No results found for this basename: CKTOTAL:3,CKMB:3,CKMBINDEX:3,TROPONINI:3 in the last 72 hours BNP: No results found  for this basename: PROBNP:3 in the last 72 hours D-Dimer: No results found for this basename: DDIMER:2 in the last 72 hours CBG:  Basename 10/22/11 0723 10/21/11 2229 10/21/11 1640 10/21/11 1138 10/21/11 0735 10/20/11 2113  GLUCAP 237* 320* 186* 236* 213* 294*   Hemoglobin A1C: No results found for this basename: HGBA1C in the last 72 hours Fasting Lipid Panel: No results found for this basename: CHOL,HDL,LDLCALC,TRIG,CHOLHDL,LDLDIRECT in the last 72 hours Thyroid Function Tests: No results found for this basename: TSH,T4TOTAL,FREET4,T3FREE,THYROIDAB in the last 72 hours Anemia Panel: No results found for this basename: VITAMINB12,FOLATE,FERRITIN,TIBC,IRON,RETICCTPCT in the last 72 hours Coagulation: No results found for this basename: LABPROT:2,INR:2 in the last 72 hours Urine Drug Screen: Drugs of Abuse     Component Value Date/Time   LABOPIA POSITIVE* 10/28/2007 2113   COCAINSCRNUR NONE DETECTED 10/28/2007 2113   LABBENZ NONE DETECTED 10/28/2007 2113   AMPHETMU NONE DETECTED 10/28/2007 2113   THCU POSITIVE* 10/28/2007 2113   LABBARB  Value: NONE DETECTED        DRUG SCREEN FOR MEDICAL PURPOSES ONLY.  IF CONFIRMATION IS NEEDED FOR ANY PURPOSE, NOTIFY LAB WITHIN 5 DAYS. 10/28/2007 2113    Alcohol Level: No results found for this basename: ETH:2 in the last 72 hours Urinalysis: No results found for this basename: COLORURINE:2,APPERANCEUR:2,LABSPEC:2,PHURINE:2,GLUCOSEU:2,HGBUR:2,BILIRUBINUR:2,KETONESUR:2,PROTEINUR:2,UROBILINOGEN:2,NITRITE:2,LEUKOCYTESUR:2 in the last 72 hours  Recent Results (from the past 240 hour(s))  CULTURE, BLOOD (ROUTINE X 2)     Status: Normal   Collection Time   10/16/11  9:43 PM      Component Value Range Status Comment   Specimen Description BLOOD LEFT ANTECUBITAL   Final    Special Requests BOTTLES DRAWN AEROBIC AND ANAEROBIC 18CC EACH   Final    Culture NO GROWTH 5 DAYS   Final    Report Status 10/21/2011 FINAL   Final   CULTURE, BLOOD (ROUTINE X 2)      Status: Normal   Collection Time   10/16/11  9:49 PM      Component Value Range Status Comment   Specimen Description BLOOD RIGHT ANTECUBITAL DRAWN BY RN   Final    Special Requests     Final    Value: BOTTLES DRAWN AEROBIC AND ANAEROBIC AEB=5CC ANA=6CC   Culture NO GROWTH 5 DAYS   Final    Report Status 10/21/2011 FINAL   Final   MRSA PCR SCREENING     Status: Normal   Collection Time   10/20/11  3:51 PM      Component Value Range Status Comment   MRSA by PCR NEGATIVE  NEGATIVE Final     Studies/Results: No results found.  Medications: Scheduled Meds:   . albuterol  2.5 mg Nebulization QID  . amLODipine  5 mg Oral Daily  . aspirin  325 mg Oral Daily  . citalopram  20 mg Oral Daily  . fluticasone  1 spray Each Nare Daily  . Fluticasone-Salmeterol  1 puff Inhalation BID  . guaiFENesin  600 mg Oral BID  . guaiFENesin-dextromethorphan  5 mL Oral Q4H  . insulin aspart  0-20 Units Subcutaneous TID WC  . insulin aspart  0-5 Units Subcutaneous QHS  . levofloxacin  500 mg Oral Daily  . metFORMIN  500 mg Oral BID WC  . methylPREDNISolone (SOLU-MEDROL) injection  125 mg Intravenous Q6H  . nicotine  14 mg Transdermal Daily  . pantoprazole  40 mg Oral Q1200  . piperacillin-tazobactam (ZOSYN)  IV  3.375 g Intravenous Q8H  . sodium chloride      . tiotropium  18 mcg Inhalation Daily  . vancomycin  1,000 mg Intravenous Q12H   Continuous Infusions:  PRN Meds:.albuterol, oxyCODONE-acetaminophen  Assessment/Plan:  Principal Problem:  *PNA (pneumonia) Active Problems:  Microcytic anemia  HTN (hypertension)  DM type 2 (diabetes mellitus, type 2)  Tobacco abuse  COPD (chronic obstructive pulmonary disease)  Chronic respiratory failure  Oxygen dependent  Changing mole right temporal area  Plan:  1. Acute on chronic respiratory failure due to pneumonia. Patient has been placed on triple abx. Appreciate Dr. Juanetta Gosling assistance. Will try and wean down oxygen as tolerated.   2.  Pneumonia. Patient had worsening infiltrates on chest xray. Will continue current regimen for now. Plan on de-escalating antibiotics as clinical status improves.   3. Oxygen dependent COPD. On high dose steroids. She has had mild improvement today. Agree with reducing steroids.   4. DM2, on sliding scale insulin   5. Dispo. Continue to monitor in SDU for now   LOS: 6 days   Delisia Mcquiston [facility] Pager: [phone] 10/22/2011, 9:49 AM

## 2011-10-22 NOTE — Progress Notes (Signed)
Subjective: She is overall better. She was able to maintain oxygenation on 50% oxygen through the night last night. When she takes her oxygen off she quickly becomes hypoxic however. She has no other new complaints. She still feels like she's got a lot of congestion in her chest.  Objective: Vital signs in last 24 hours: Temp:  [97.4 F (36.3 C)-98.7 F (37.1 C)] 97.4 F (36.3 C) (06/30 0800) Pulse Rate:  [66-97] 85  (06/30 0800) Resp:  [17-23] 17  (06/30 0800) BP: (145-168)/(59-78) 155/68 mmHg (06/30 0800) SpO2:  [82 %-95 %] 88 % (06/30 0800) FiO2 (%):  [40 %-50 %] 50 % (06/30 0800) Weight change:  Last BM Date: 10/22/11  Intake/Output from previous day: 06/29 0701 - 06/30 0700 In: 1550 [P.O.:800; IV Piggyback:750] Out: 2850 [Urine:2850]  PHYSICAL EXAM General appearance: alert, cooperative and morbidly obese Resp: rhonchi bilaterally Cardio: regular rate and rhythm, S1, S2 normal, no murmur, click, rub or gallop GI: soft, non-tender; bowel sounds normal; no masses,  no organomegaly Extremities: extremities normal, atraumatic, no cyanosis or edema  Lab Results:    Basic Metabolic Panel:  Basename 10/22/11 0513 10/21/11 0441  NA 136 137  K 3.9 3.9  CL 96 97  CO2 32 31  GLUCOSE 233* 211*  BUN 14 11  CREATININE 0.52 0.52  CALCIUM 9.1 9.1  MG -- --  PHOS -- --   Liver Function Tests: No results found for this basename: AST:2,ALT:2,ALKPHOS:2,BILITOT:2,PROT:2,ALBUMIN:2 in the last 72 hours No results found for this basename: LIPASE:2,AMYLASE:2 in the last 72 hours No results found for this basename: AMMONIA:2 in the last 72 hours CBC:  Basename 10/22/11 0513 10/21/11 0441  WBC 12.2* 17.1*  NEUTROABS -- --  HGB 9.7* 9.7*  HCT 32.2* 32.3*  MCV 74.2* 73.4*  PLT 256 254   Cardiac Enzymes: No results found for this basename: CKTOTAL:3,CKMB:3,CKMBINDEX:3,TROPONINI:3 in the last 72 hours BNP: No results found for this basename: PROBNP:3 in the last 72  hours D-Dimer: No results found for this basename: DDIMER:2 in the last 72 hours CBG:  Basename 10/22/11 0723 10/21/11 2229 10/21/11 1640 10/21/11 1138 10/21/11 0735 10/20/11 2113  GLUCAP 237* 320* 186* 236* 213* 294*   Hemoglobin A1C: No results found for this basename: HGBA1C in the last 72 hours Fasting Lipid Panel: No results found for this basename: CHOL,HDL,LDLCALC,TRIG,CHOLHDL,LDLDIRECT in the last 72 hours Thyroid Function Tests: No results found for this basename: TSH,T4TOTAL,FREET4,T3FREE,THYROIDAB in the last 72 hours Anemia Panel: No results found for this basename: VITAMINB12,FOLATE,FERRITIN,TIBC,IRON,RETICCTPCT in the last 72 hours Coagulation: No results found for this basename: LABPROT:2,INR:2 in the last 72 hours Urine Drug Screen: Drugs of Abuse     Component Value Date/Time   LABOPIA POSITIVE* 10/28/2007 2113   COCAINSCRNUR NONE DETECTED 10/28/2007 2113   LABBENZ NONE DETECTED 10/28/2007 2113   AMPHETMU NONE DETECTED 10/28/2007 2113   THCU POSITIVE* 10/28/2007 2113   LABBARB  Value: NONE DETECTED        DRUG SCREEN FOR MEDICAL PURPOSES ONLY.  IF CONFIRMATION IS NEEDED FOR ANY PURPOSE, NOTIFY LAB WITHIN 5 DAYS. 10/28/2007 2113    Alcohol Level: No results found for this basename: ETH:2 in the last 72 hours Urinalysis: No results found for this basename: COLORURINE:2,APPERANCEUR:2,LABSPEC:2,PHURINE:2,GLUCOSEU:2,HGBUR:2,BILIRUBINUR:2,KETONESUR:2,PROTEINUR:2,UROBILINOGEN:2,NITRITE:2,LEUKOCYTESUR:2 in the last 72 hours Misc. Labs:  ABGS  Basename 10/20/11 0755  PHART 7.422*  PO2ART 48.9*  TCO2 26.2  HCO3 28.4*   CULTURES Recent Results (from the past 240 hour(s))  CULTURE, BLOOD (ROUTINE X 2)  Status: Normal   Collection Time   10/16/11  9:43 PM      Component Value Range Status Comment   Specimen Description BLOOD LEFT ANTECUBITAL   Final    Special Requests BOTTLES DRAWN AEROBIC AND ANAEROBIC 18CC EACH   Final    Culture NO GROWTH 5 DAYS   Final    Report  Status 10/21/2011 FINAL   Final   CULTURE, BLOOD (ROUTINE X 2)     Status: Normal   Collection Time   10/16/11  9:49 PM      Component Value Range Status Comment   Specimen Description BLOOD RIGHT ANTECUBITAL DRAWN BY RN   Final    Special Requests     Final    Value: BOTTLES DRAWN AEROBIC AND ANAEROBIC AEB=5CC ANA=6CC   Culture NO GROWTH 5 DAYS   Final    Report Status 10/21/2011 FINAL   Final   MRSA PCR SCREENING     Status: Normal   Collection Time   10/20/11  3:51 PM      Component Value Range Status Comment   MRSA by PCR NEGATIVE  NEGATIVE Final    Studies/Results: No results found.  Medications:  Scheduled:   . albuterol  2.5 mg Nebulization QID  . amLODipine  5 mg Oral Daily  . aspirin  325 mg Oral Daily  . citalopram  20 mg Oral Daily  . fluticasone  1 spray Each Nare Daily  . Fluticasone-Salmeterol  1 puff Inhalation BID  . guaiFENesin  600 mg Oral BID  . guaiFENesin-dextromethorphan  5 mL Oral Q4H  . insulin aspart  0-20 Units Subcutaneous TID WC  . insulin aspart  0-5 Units Subcutaneous QHS  . levofloxacin  500 mg Oral Daily  . metFORMIN  500 mg Oral BID WC  . methylPREDNISolone (SOLU-MEDROL) injection  125 mg Intravenous Q6H  . nicotine  14 mg Transdermal Daily  . pantoprazole  40 mg Oral Q1200  . piperacillin-tazobactam (ZOSYN)  IV  3.375 g Intravenous Q8H  . sodium chloride      . tiotropium  18 mcg Inhalation Daily  . vancomycin  1,000 mg Intravenous Q12H   Continuous:  ZOX:[id number], oxyCODONE-acetaminophen  Assesment: She was admitted with pneumonia which initially seemed to be responding to treatment for community-acquired pneumonia. She then developed severe her difficulty with hypoxia and shortness of breath and now is being treated with triple antibiotic therapy. She is improving. She has severe COPD. Her oxygenation is better. She is overall improving but slowly Principal Problem:  *PNA (pneumonia) Active Problems:  Microcytic anemia  HTN  (hypertension)  DM type 2 (diabetes mellitus, type 2)  Tobacco abuse  COPD (chronic obstructive pulmonary disease)  Chronic respiratory failure  Oxygen dependent  Changing mole right temporal area    Plan: No change in current treatments. We probably can reduce her steroids    LOS: 6 days   Nayana Lenig L 10/22/2011, 9:08 AM

## 2011-10-23 DIAGNOSIS — J449 Chronic obstructive pulmonary disease, unspecified: Secondary | ICD-10-CM

## 2011-10-23 DIAGNOSIS — J9610 Chronic respiratory failure, unspecified whether with hypoxia or hypercapnia: Secondary | ICD-10-CM

## 2011-10-23 DIAGNOSIS — E119 Type 2 diabetes mellitus without complications: Secondary | ICD-10-CM

## 2011-10-23 DIAGNOSIS — J189 Pneumonia, unspecified organism: Principal | ICD-10-CM

## 2011-10-23 LAB — GLUCOSE, CAPILLARY
Glucose-Capillary: 250 mg/dL — ABNORMAL HIGH (ref 70–99)
Glucose-Capillary: 337 mg/dL — ABNORMAL HIGH (ref 70–99)
Glucose-Capillary: 198 mg/dL — ABNORMAL HIGH (ref 70–99)
Glucose-Capillary: 327 mg/dL — ABNORMAL HIGH (ref 70–99)

## 2011-10-23 LAB — VANCOMYCIN, TROUGH: Vancomycin Tr: <5 ug/mL — ABNORMAL LOW (ref 10.0–20.0)

## 2011-10-23 MED ORDER — VANCOMYCIN HCL 1000 MG IV SOLR
2000.0000 mg | Freq: Two times a day (BID) | INTRAVENOUS | Status: DC
  Administered 2011-10-23 – 2011-10-24 (×3): 2000 mg via INTRAVENOUS
  Filled 2011-10-23 (×5): qty 2000

## 2011-10-23 MED ORDER — INSULIN DETEMIR 100 UNIT/ML ~~LOC~~ SOLN
10.0000 [IU] | Freq: Every day | SUBCUTANEOUS | Status: DC
Start: 2011-10-23 — End: 2011-10-25
  Administered 2011-10-23 – 2011-10-25 (×3): 10 [IU] via SUBCUTANEOUS
  Filled 2011-10-23: qty 10

## 2011-10-23 MED ORDER — ALBUTEROL SULFATE (5 MG/ML) 0.5% IN NEBU
2.5000 mg | INHALATION_SOLUTION | Freq: Four times a day (QID) | RESPIRATORY_TRACT | Status: DC
  Administered 2011-10-23 – 2011-10-25 (×10): 2.5 mg via RESPIRATORY_TRACT
  Filled 2011-10-23 (×10): qty 0.5

## 2011-10-23 NOTE — Progress Notes (Signed)
Changed to 50% venti mask for sleep comfort.

## 2011-10-23 NOTE — Progress Notes (Signed)
ANTIBIOTIC CONSULT NOTE - INITIAL  Pharmacy Consult for Vancomcyin & Zosyn Indication: PNA  Allergies  Allergen Reactions  . Codeine Itching and Nausea Only  . Wellbutrin (Bupropion Hcl) Hives    Patient Measurements: Height: 5\' 4"  (162.6 cm) Weight: 270 lb 11.6 oz (122.8 kg) IBW/kg (Calculated) : 54.7   Vital Signs: Temp: 98.7 F (37.1 C) (07/01 0400) Temp src: Oral (07/01 0400) BP: 146/68 mmHg (07/01 0600) Pulse Rate: 65  (07/01 0600) Intake/Output from previous day: 06/30 0701 - 07/01 0700 In: 1550 [P.O.:1000; IV Piggyback:550] Out: 1900 [Urine:1900] Intake/Output from this shift:    Labs:  Basename 10/22/11 0513 10/21/11 0441  WBC 12.2* 17.1*  HGB 9.7* 9.7*  PLT 256 254  LABCREA -- --  CREATININE 0.52 0.52   Estimated Creatinine Clearance: 114.8 ml/min (by C-G formula based on Cr of 0.52).  Basename 10/23/11 0749  VANCOTROUGH <5.0*  VANCOPEAK --  Drue Dun --  Janeece Agee --  GENTPEAK --  GENTRANDOM --  TOBRATROUGH --  TOBRAPEAK --  TOBRARND --  AMIKACINPEAK --  AMIKACINTROU --  AMIKACIN --     Microbiology: Recent Results (from the past 720 hour(s))  CULTURE, BLOOD (ROUTINE X 2)     Status: Normal   Collection Time   10/16/11  9:43 PM      Component Value Range Status Comment   Specimen Description BLOOD LEFT ANTECUBITAL   Final    Special Requests BOTTLES DRAWN AEROBIC AND ANAEROBIC 18CC EACH   Final    Culture NO GROWTH 5 DAYS   Final    Report Status 10/21/2011 FINAL   Final   CULTURE, BLOOD (ROUTINE X 2)     Status: Normal   Collection Time   10/16/11  9:49 PM      Component Value Range Status Comment   Specimen Description BLOOD RIGHT ANTECUBITAL DRAWN BY RN   Final    Special Requests     Final    Value: BOTTLES DRAWN AEROBIC AND ANAEROBIC AEB=5CC ANA=6CC   Culture NO GROWTH 5 DAYS   Final    Report Status 10/21/2011 FINAL   Final   MRSA PCR SCREENING     Status: Normal   Collection Time   10/20/11  3:51 PM      Component Value  Range Status Comment   MRSA by PCR NEGATIVE  NEGATIVE Final   CULTURE, EXPECTORATED SPUTUM-ASSESSMENT     Status: Normal   Collection Time   10/22/11  5:55 AM      Component Value Range Status Comment   Specimen Description SPUTUM   Final    Special Requests NONE   Final    Sputum evaluation     Final    Value: MICROSCOPIC FINDINGS SUGGEST THAT THIS SPECIMEN IS NOT REPRESENTATIVE OF LOWER RESPIRATORY SECRETIONS. PLEASE RECOLLECT.     CALLED TO TOLER M. AT [id number] ON [id number] BY THOMPSON S.   Report Status 10/22/2011 FINAL   Final     Medical History: Past Medical History  Diagnosis Date  . Diabetes mellitus   . COPD (chronic obstructive pulmonary disease)   . HTN (hypertension)   . Low back pain   . Tachycardia     never had test done since no insurance  . Depression   . Asthma   . Shortness of breath   . Anxiety   . Gastric erosions     EGD 08/2010.  . Internal hemorrhoids     Colonoscopy 5/12.  [name] Heavy menses   .  Chronic respiratory failure with hypoxia     On 2-3 L of oxygen at home    Medications:  Scheduled:     . albuterol  2.5 mg Nebulization QID  . amLODipine  5 mg Oral Daily  . aspirin  325 mg Oral Daily  . citalopram  20 mg Oral Daily  . fluticasone  1 spray Each Nare Daily  . Fluticasone-Salmeterol  1 puff Inhalation BID  . guaiFENesin  600 mg Oral BID  . guaiFENesin-dextromethorphan  5 mL Oral Q4H  . insulin aspart  0-20 Units Subcutaneous TID WC  . insulin aspart  0-5 Units Subcutaneous QHS  . insulin detemir  10 Units Subcutaneous Daily  . levofloxacin  500 mg Oral Daily  . metFORMIN  500 mg Oral BID WC  . methylPREDNISolone (SOLU-MEDROL) injection  80 mg Intravenous Q6H  . nicotine  14 mg Transdermal Daily  . pantoprazole  40 mg Oral Q1200  . piperacillin-tazobactam (ZOSYN)  IV  3.375 g Intravenous Q8H  . sodium chloride      . tiotropium  18 mcg Inhalation Daily  . vancomycin  1,000 mg Intravenous Q12H  . DISCONTD: albuterol  2.5 mg Nebulization  QID  . DISCONTD: methylPREDNISolone (SOLU-MEDROL) injection  125 mg Intravenous Q6H   Assessment: 46 yo F with PNA currently on day#6 Levaquin and day#4 Vancomycin & Zosyn.   Her renal function is stable and cx data was negative.   She is clinically improved, but vancomycin trough level this morning was undetectable.   Goal of Therapy:  Vancomycin trough level 15-20 mcg/ml  Plan:  1) Continue Zosyn 3.375gm IV Q8h to be infused over 4hrs 2) Increase Vancomycin 2000mg  IV q12h 3) Weekly Vancomycin trough while on vancomycin 4) Monitor renal function   Elson Clan 10/23/2011,9:23 AM

## 2011-10-23 NOTE — Progress Notes (Signed)
Subjective: She looks better today. She says she feels better. She has no new complaints.  Objective: Vital signs in last 24 hours: Temp:  [97.7 F (36.5 C)-98.7 F (37.1 C)] 98.7 F (37.1 C) (07/01 0400) Pulse Rate:  [64-101] 65  (07/01 0600) Resp:  [15-27] 16  (07/01 0600) BP: (138-171)/(54-77) 146/68 mmHg (07/01 0600) SpO2:  [85 %-97 %] 92 % (07/01 0735) FiO2 (%):  [5 %-50 %] 50 % (07/01 0452) Weight:  [122.8 kg (270 lb 11.6 oz)] 122.8 kg (270 lb 11.6 oz) (07/01 0500) Weight change:  Last BM Date: 10/22/11  Intake/Output from previous day: 06/30 0701 - 07/01 0700 In: 1550 [P.O.:1000; IV Piggyback:550] Out: 1900 [Urine:1900]  PHYSICAL EXAM General appearance: alert, cooperative, mild distress and morbidly obese Resp: rhonchi bilaterally Cardio: regular rate and rhythm, S1, S2 normal, no murmur, click, rub or gallop GI: soft, non-tender; bowel sounds normal; no masses,  no organomegaly Extremities: extremities normal, atraumatic, no cyanosis or edema  Lab Results:    Basic Metabolic Panel:  Basename 10/22/11 0513 10/21/11 0441  NA 136 137  K 3.9 3.9  CL 96 97  CO2 32 31  GLUCOSE 233* 211*  BUN 14 11  CREATININE 0.52 0.52  CALCIUM 9.1 9.1  MG -- --  PHOS -- --   Liver Function Tests: No results found for this basename: AST:2,ALT:2,ALKPHOS:2,BILITOT:2,PROT:2,ALBUMIN:2 in the last 72 hours No results found for this basename: LIPASE:2,AMYLASE:2 in the last 72 hours No results found for this basename: AMMONIA:2 in the last 72 hours CBC:  Basename 10/22/11 0513 10/21/11 0441  WBC 12.2* 17.1*  NEUTROABS -- --  HGB 9.7* 9.7*  HCT 32.2* 32.3*  MCV 74.2* 73.4*  PLT 256 254   Cardiac Enzymes: No results found for this basename: CKTOTAL:3,CKMB:3,CKMBINDEX:3,TROPONINI:3 in the last 72 hours BNP: No results found for this basename: PROBNP:3 in the last 72 hours D-Dimer: No results found for this basename: DDIMER:2 in the last 72 hours CBG:  Basename 10/23/11  0740 10/22/11 2136 10/22/11 1634 10/22/11 1157 10/22/11 0723 10/21/11 2229  GLUCAP 250* 327* 228* 210* 237* 320*   Hemoglobin A1C: No results found for this basename: HGBA1C in the last 72 hours Fasting Lipid Panel: No results found for this basename: CHOL,HDL,LDLCALC,TRIG,CHOLHDL,LDLDIRECT in the last 72 hours Thyroid Function Tests: No results found for this basename: TSH,T4TOTAL,FREET4,T3FREE,THYROIDAB in the last 72 hours Anemia Panel: No results found for this basename: VITAMINB12,FOLATE,FERRITIN,TIBC,IRON,RETICCTPCT in the last 72 hours Coagulation: No results found for this basename: LABPROT:2,INR:2 in the last 72 hours Urine Drug Screen: Drugs of Abuse     Component Value Date/Time   LABOPIA POSITIVE* 10/28/2007 2113   COCAINSCRNUR NONE DETECTED 10/28/2007 2113   LABBENZ NONE DETECTED 10/28/2007 2113   AMPHETMU NONE DETECTED 10/28/2007 2113   THCU POSITIVE* 10/28/2007 2113   LABBARB  Value: NONE DETECTED        DRUG SCREEN FOR MEDICAL PURPOSES ONLY.  IF CONFIRMATION IS NEEDED FOR ANY PURPOSE, NOTIFY LAB WITHIN 5 DAYS. 10/28/2007 2113    Alcohol Level: No results found for this basename: ETH:2 in the last 72 hours Urinalysis: No results found for this basename: COLORURINE:2,APPERANCEUR:2,LABSPEC:2,PHURINE:2,GLUCOSEU:2,HGBUR:2,BILIRUBINUR:2,KETONESUR:2,PROTEINUR:2,UROBILINOGEN:2,NITRITE:2,LEUKOCYTESUR:2 in the last 72 hours Misc. Labs:  ABGS No results found for this basename: PHART,PCO2,PO2ART,TCO2,HCO3 in the last 72 hours CULTURES Recent Results (from the past 240 hour(s))  CULTURE, BLOOD (ROUTINE X 2)     Status: Normal   Collection Time   10/16/11  9:43 PM      Component Value Range Status Comment  Specimen Description BLOOD LEFT ANTECUBITAL   Final    Special Requests BOTTLES DRAWN AEROBIC AND ANAEROBIC 18CC EACH   Final    Culture NO GROWTH 5 DAYS   Final    Report Status 10/21/2011 FINAL   Final   CULTURE, BLOOD (ROUTINE X 2)     Status: Normal   Collection Time    10/16/11  9:49 PM      Component Value Range Status Comment   Specimen Description BLOOD RIGHT ANTECUBITAL DRAWN BY RN   Final    Special Requests     Final    Value: BOTTLES DRAWN AEROBIC AND ANAEROBIC AEB=5CC ANA=6CC   Culture NO GROWTH 5 DAYS   Final    Report Status 10/21/2011 FINAL   Final   MRSA PCR SCREENING     Status: Normal   Collection Time   10/20/11  3:51 PM      Component Value Range Status Comment   MRSA by PCR NEGATIVE  NEGATIVE Final   CULTURE, EXPECTORATED SPUTUM-ASSESSMENT     Status: Normal   Collection Time   10/22/11  5:55 AM      Component Value Range Status Comment   Specimen Description SPUTUM   Final    Special Requests NONE   Final    Sputum evaluation     Final    Value: MICROSCOPIC FINDINGS SUGGEST THAT THIS SPECIMEN IS NOT REPRESENTATIVE OF LOWER RESPIRATORY SECRETIONS. PLEASE RECOLLECT.     CALLED TO TOLER M. AT [id number] ON [id number] BY THOMPSON S.   Report Status 10/22/2011 FINAL   Final    Studies/Results: No results found.  Medications:  Scheduled:   . albuterol  2.5 mg Nebulization QID  . amLODipine  5 mg Oral Daily  . aspirin  325 mg Oral Daily  . citalopram  20 mg Oral Daily  . fluticasone  1 spray Each Nare Daily  . Fluticasone-Salmeterol  1 puff Inhalation BID  . guaiFENesin  600 mg Oral BID  . guaiFENesin-dextromethorphan  5 mL Oral Q4H  . insulin aspart  0-20 Units Subcutaneous TID WC  . insulin aspart  0-5 Units Subcutaneous QHS  . levofloxacin  500 mg Oral Daily  . metFORMIN  500 mg Oral BID WC  . methylPREDNISolone (SOLU-MEDROL) injection  80 mg Intravenous Q6H  . nicotine  14 mg Transdermal Daily  . pantoprazole  40 mg Oral Q1200  . piperacillin-tazobactam (ZOSYN)  IV  3.375 g Intravenous Q8H  . sodium chloride      . tiotropium  18 mcg Inhalation Daily  . vancomycin  1,000 mg Intravenous Q12H  . DISCONTD: albuterol  2.5 mg Nebulization QID  . DISCONTD: methylPREDNISolone (SOLU-MEDROL) injection  125 mg Intravenous Q6H    Continuous:  AOZ:[id number], oxyCODONE-acetaminophen  Assesment: She has pneumonia and developed acute hypoxic respiratory failure. She has COPD. Overall she's better now. Her oxygen requirement has decreased in her chest sounds better. Principal Problem:  *PNA (pneumonia) Active Problems:  Microcytic anemia  HTN (hypertension)  DM type 2 (diabetes mellitus, type 2)  Tobacco abuse  COPD (chronic obstructive pulmonary disease)  Chronic respiratory failure  Oxygen dependent  Changing mole right temporal area    Plan: continue current treatments as she is obviously continuing to improve    LOS: 7 days   Kaimen Peine L 10/23/2011, 8:04 AM

## 2011-10-23 NOTE — Progress Notes (Signed)
Subjective: Patient is feeling better today, breathing is improving, still coughing  Objective: Vital signs in last 24 hours: Temp:  [97.7 F (36.5 C)-98.7 F (37.1 C)] 98.7 F (37.1 C) (07/01 0400) Pulse Rate:  [64-101] 65  (07/01 0600) Resp:  [15-27] 16  (07/01 0600) BP: (138-171)/(54-77) 146/68 mmHg (07/01 0600) SpO2:  [85 %-97 %] 92 % (07/01 0735) FiO2 (%):  [5 %-50 %] 50 % (07/01 0452) Weight:  [122.8 kg (270 lb 11.6 oz)] 122.8 kg (270 lb 11.6 oz) (07/01 0500) Weight change:  Last BM Date: 10/22/11  Intake/Output from previous day: 06/30 0701 - 07/01 0700 In: 1550 [P.O.:1000; IV Piggyback:550] Out: 1900 [Urine:1900]     Physical Exam: General: Alert, awake, oriented x3, in no acute distress. HEENT: No bruits, no goiter. Heart: Regular rate and rhythm, without murmurs, rubs, gallops. Lungs: b/l exp wheezes improving. Abdomen: Soft, nontender, nondistended, positive bowel sounds. Extremities: No clubbing cyanosis or edema with positive pedal pulses. Neuro: Grossly intact, nonfocal.    Lab Results: Basic Metabolic Panel:  Basename 10/22/11 0513 10/21/11 0441  NA 136 137  K 3.9 3.9  CL 96 97  CO2 32 31  GLUCOSE 233* 211*  BUN 14 11  CREATININE 0.52 0.52  CALCIUM 9.1 9.1  MG -- --  PHOS -- --   Liver Function Tests: No results found for this basename: AST:2,ALT:2,ALKPHOS:2,BILITOT:2,PROT:2,ALBUMIN:2 in the last 72 hours No results found for this basename: LIPASE:2,AMYLASE:2 in the last 72 hours No results found for this basename: AMMONIA:2 in the last 72 hours CBC:  Basename 10/22/11 0513 10/21/11 0441  WBC 12.2* 17.1*  NEUTROABS -- --  HGB 9.7* 9.7*  HCT 32.2* 32.3*  MCV 74.2* 73.4*  PLT 256 254   Cardiac Enzymes: No results found for this basename: CKTOTAL:3,CKMB:3,CKMBINDEX:3,TROPONINI:3 in the last 72 hours BNP: No results found for this basename: PROBNP:3 in the last 72 hours D-Dimer: No results found for this basename: DDIMER:2 in the last  72 hours CBG:  Basename 10/23/11 0740 10/22/11 2136 10/22/11 1634 10/22/11 1157 10/22/11 0723 10/21/11 2229  GLUCAP 250* 327* 228* 210* 237* 320*   Hemoglobin A1C: No results found for this basename: HGBA1C in the last 72 hours Fasting Lipid Panel: No results found for this basename: CHOL,HDL,LDLCALC,TRIG,CHOLHDL,LDLDIRECT in the last 72 hours Thyroid Function Tests: No results found for this basename: TSH,T4TOTAL,FREET4,T3FREE,THYROIDAB in the last 72 hours Anemia Panel: No results found for this basename: VITAMINB12,FOLATE,FERRITIN,TIBC,IRON,RETICCTPCT in the last 72 hours Coagulation: No results found for this basename: LABPROT:2,INR:2 in the last 72 hours Urine Drug Screen: Drugs of Abuse     Component Value Date/Time   LABOPIA POSITIVE* 10/28/2007 2113   COCAINSCRNUR NONE DETECTED 10/28/2007 2113   LABBENZ NONE DETECTED 10/28/2007 2113   AMPHETMU NONE DETECTED 10/28/2007 2113   THCU POSITIVE* 10/28/2007 2113   LABBARB  Value: NONE DETECTED        DRUG SCREEN FOR MEDICAL PURPOSES ONLY.  IF CONFIRMATION IS NEEDED FOR ANY PURPOSE, NOTIFY LAB WITHIN 5 DAYS. 10/28/2007 2113    Alcohol Level: No results found for this basename: ETH:2 in the last 72 hours Urinalysis: No results found for this basename: COLORURINE:2,APPERANCEUR:2,LABSPEC:2,PHURINE:2,GLUCOSEU:2,HGBUR:2,BILIRUBINUR:2,KETONESUR:2,PROTEINUR:2,UROBILINOGEN:2,NITRITE:2,LEUKOCYTESUR:2 in the last 72 hours  Recent Results (from the past 240 hour(s))  CULTURE, BLOOD (ROUTINE X 2)     Status: Normal   Collection Time   10/16/11  9:43 PM      Component Value Range Status Comment   Specimen Description BLOOD LEFT ANTECUBITAL   Final    Special  Requests BOTTLES DRAWN AEROBIC AND ANAEROBIC 18CC EACH   Final    Culture NO GROWTH 5 DAYS   Final    Report Status 10/21/2011 FINAL   Final   CULTURE, BLOOD (ROUTINE X 2)     Status: Normal   Collection Time   10/16/11  9:49 PM      Component Value Range Status Comment   Specimen Description  BLOOD RIGHT ANTECUBITAL DRAWN BY RN   Final    Special Requests     Final    Value: BOTTLES DRAWN AEROBIC AND ANAEROBIC AEB=5CC ANA=6CC   Culture NO GROWTH 5 DAYS   Final    Report Status 10/21/2011 FINAL   Final   MRSA PCR SCREENING     Status: Normal   Collection Time   10/20/11  3:51 PM      Component Value Range Status Comment   MRSA by PCR NEGATIVE  NEGATIVE Final   CULTURE, EXPECTORATED SPUTUM-ASSESSMENT     Status: Normal   Collection Time   10/22/11  5:55 AM      Component Value Range Status Comment   Specimen Description SPUTUM   Final    Special Requests NONE   Final    Sputum evaluation     Final    Value: MICROSCOPIC FINDINGS SUGGEST THAT THIS SPECIMEN IS NOT REPRESENTATIVE OF LOWER RESPIRATORY SECRETIONS. PLEASE RECOLLECT.     CALLED TO TOLER M. AT 1610R ON [id number] BY THOMPSON S.   Report Status 10/22/2011 FINAL   Final     Studies/Results: No results found.  Medications: Scheduled Meds:   . albuterol  2.5 mg Nebulization QID  . amLODipine  5 mg Oral Daily  . aspirin  325 mg Oral Daily  . citalopram  20 mg Oral Daily  . fluticasone  1 spray Each Nare Daily  . Fluticasone-Salmeterol  1 puff Inhalation BID  . guaiFENesin  600 mg Oral BID  . guaiFENesin-dextromethorphan  5 mL Oral Q4H  . insulin aspart  0-20 Units Subcutaneous TID WC  . insulin aspart  0-5 Units Subcutaneous QHS  . insulin detemir  10 Units Subcutaneous Daily  . levofloxacin  500 mg Oral Daily  . metFORMIN  500 mg Oral BID WC  . methylPREDNISolone (SOLU-MEDROL) injection  80 mg Intravenous Q6H  . nicotine  14 mg Transdermal Daily  . pantoprazole  40 mg Oral Q1200  . piperacillin-tazobactam (ZOSYN)  IV  3.375 g Intravenous Q8H  . sodium chloride      . tiotropium  18 mcg Inhalation Daily  . vancomycin  1,000 mg Intravenous Q12H  . DISCONTD: albuterol  2.5 mg Nebulization QID  . DISCONTD: methylPREDNISolone (SOLU-MEDROL) injection  125 mg Intravenous Q6H   Continuous Infusions:  PRN  Meds:.albuterol, oxyCODONE-acetaminophen  Assessment/Plan:  Principal Problem:  *PNA (pneumonia) Active Problems:  Microcytic anemia  HTN (hypertension)  DM type 2 (diabetes mellitus, type 2)  Tobacco abuse  COPD (chronic obstructive pulmonary disease)  Chronic respiratory failure  Oxygen dependent  Changing mole right temporal area  Plan:  Clinically she is improving.  Symptomatically she feels better.  Will continue current treatments.  Will add basal insulin coverage, since her blood sugars are running high on steroids. If she shows continued improvement, I think we can de-escalate her antibiotics tomorrow. Transfer to telemetry   LOS: 7 days   Jamont Mellin [facility] Pager: [phone] 10/23/2011, 8:53 AM

## 2011-10-24 LAB — GLUCOSE, CAPILLARY
Glucose-Capillary: 216 mg/dL — ABNORMAL HIGH (ref 70–99)
Glucose-Capillary: 259 mg/dL — ABNORMAL HIGH (ref 70–99)
Glucose-Capillary: 226 mg/dL — ABNORMAL HIGH (ref 70–99)
Glucose-Capillary: 293 mg/dL — ABNORMAL HIGH (ref 70–99)

## 2011-10-24 LAB — BASIC METABOLIC PANEL
BUN: 14 mg/dL (ref 6–23)
CO2: 32 mEq/L (ref 19–32)
Calcium: 8.7 mg/dL (ref 8.4–10.5)
Chloride: 94 mEq/L — ABNORMAL LOW (ref 96–112)
Creatinine, Ser: 0.49 mg/dL — ABNORMAL LOW (ref 0.50–1.10)
GFR calc Af Amer: >90 mL/min (ref >90)
GFR calc non Af Amer: >90 mL/min (ref >90)
Glucose, Bld: 242 mg/dL — ABNORMAL HIGH (ref 70–99)
Potassium: 3.9 mEq/L (ref 3.5–5.1)
Sodium: 135 mEq/L (ref 135–145)

## 2011-10-24 LAB — CBC
HCT: 31.7 % — ABNORMAL LOW (ref 36.0–46.0)
Hemoglobin: 9.6 g/dL — ABNORMAL LOW (ref 12.0–15.0)
MCH: 23.2 pg — ABNORMAL LOW (ref 26.0–34.0)
MCHC: 30.3 g/dL (ref 30.0–36.0)
MCV: 76.8 fL — ABNORMAL LOW (ref 78.0–100.0)
Platelets: 319 10*3/uL (ref 150–400)
RBC: 4.13 MIL/uL (ref 3.87–5.11)
RDW: 24.5 % — ABNORMAL HIGH (ref 11.5–15.5)
WBC: 15 10*3/uL — ABNORMAL HIGH (ref 4.0–10.5)

## 2011-10-24 MED ORDER — METHYLPREDNISOLONE SODIUM SUCC 125 MG IJ SOLR
80.0000 mg | Freq: Two times a day (BID) | INTRAMUSCULAR | Status: DC
  Administered 2011-10-25 (×2): 80 mg via INTRAVENOUS
  Filled 2011-10-24 (×2): qty 2

## 2011-10-24 MED ORDER — SODIUM CHLORIDE 0.9 % IJ SOLN
INTRAMUSCULAR | Status: AC
  Administered 2011-10-24: 16:00:00
  Filled 2011-10-24: qty 3

## 2011-10-24 NOTE — Progress Notes (Signed)
Subjective: She seems a little better. She still has significant cough but is not able to cough anything up. Otherwise she has been having decreasing oxygen requirements and is less short of breath  Objective: Vital signs in last 24 hours: Temp:  [97.6 F (36.4 C)-98.3 F (36.8 C)] 97.8 F (36.6 C) (07/02 0620) Pulse Rate:  [70-80] 70  (07/02 0620) Resp:  [17-21] 20  (07/02 0620) BP: (132-173)/(66-83) 145/80 mmHg (07/02 0620) SpO2:  [93 %-98 %] 94 % (07/02 0756) Weight change:  Last BM Date: 10/24/11  Intake/Output from previous day: 07/01 0701 - 07/02 0700 In: 1760 [P.O.:960; IV Piggyback:800] Out: -   PHYSICAL EXAM General appearance: alert, cooperative and no distress Resp: rhonchi bilaterally Cardio: regular rate and rhythm, S1, S2 normal, no murmur, click, rub or gallop GI: soft, non-tender; bowel sounds normal; no masses,  no organomegaly Extremities: extremities normal, atraumatic, no cyanosis or edema  Lab Results:    Basic Metabolic Panel:  Basename 10/24/11 0446 10/22/11 0513  NA 135 136  K 3.9 3.9  CL 94* 96  CO2 32 32  GLUCOSE 242* 233*  BUN 14 14  CREATININE 0.49* 0.52  CALCIUM 8.7 9.1  MG -- --  PHOS -- --   Liver Function Tests: No results found for this basename: AST:2,ALT:2,ALKPHOS:2,BILITOT:2,PROT:2,ALBUMIN:2 in the last 72 hours No results found for this basename: LIPASE:2,AMYLASE:2 in the last 72 hours No results found for this basename: AMMONIA:2 in the last 72 hours CBC:  Basename 10/24/11 0446 10/22/11 0513  WBC 15.0* 12.2*  NEUTROABS -- --  HGB 9.6* 9.7*  HCT 31.7* 32.2*  MCV 76.8* 74.2*  PLT 319 256   Cardiac Enzymes: No results found for this basename: CKTOTAL:3,CKMB:3,CKMBINDEX:3,TROPONINI:3 in the last 72 hours BNP: No results found for this basename: PROBNP:3 in the last 72 hours D-Dimer: No results found for this basename: DDIMER:2 in the last 72 hours CBG:  Basename 10/24/11 0710 10/23/11 2133 10/23/11 1640 10/23/11  1135 10/23/11 0740 10/22/11 2136  GLUCAP 216* 327* 198* 337* 250* 327*   Hemoglobin A1C: No results found for this basename: HGBA1C in the last 72 hours Fasting Lipid Panel: No results found for this basename: CHOL,HDL,LDLCALC,TRIG,CHOLHDL,LDLDIRECT in the last 72 hours Thyroid Function Tests: No results found for this basename: TSH,T4TOTAL,FREET4,T3FREE,THYROIDAB in the last 72 hours Anemia Panel: No results found for this basename: VITAMINB12,FOLATE,FERRITIN,TIBC,IRON,RETICCTPCT in the last 72 hours Coagulation: No results found for this basename: LABPROT:2,INR:2 in the last 72 hours Urine Drug Screen: Drugs of Abuse     Component Value Date/Time   LABOPIA POSITIVE* 10/28/2007 2113   COCAINSCRNUR NONE DETECTED 10/28/2007 2113   LABBENZ NONE DETECTED 10/28/2007 2113   AMPHETMU NONE DETECTED 10/28/2007 2113   THCU POSITIVE* 10/28/2007 2113   LABBARB  Value: NONE DETECTED        DRUG SCREEN FOR MEDICAL PURPOSES ONLY.  IF CONFIRMATION IS NEEDED FOR ANY PURPOSE, NOTIFY LAB WITHIN 5 DAYS. 10/28/2007 2113    Alcohol Level: No results found for this basename: ETH:2 in the last 72 hours Urinalysis: No results found for this basename: COLORURINE:2,APPERANCEUR:2,LABSPEC:2,PHURINE:2,GLUCOSEU:2,HGBUR:2,BILIRUBINUR:2,KETONESUR:2,PROTEINUR:2,UROBILINOGEN:2,NITRITE:2,LEUKOCYTESUR:2 in the last 72 hours Misc. Labs:  ABGS No results found for this basename: PHART,PCO2,PO2ART,TCO2,HCO3 in the last 72 hours CULTURES Recent Results (from the past 240 hour(s))  CULTURE, BLOOD (ROUTINE X 2)     Status: Normal   Collection Time   10/16/11  9:43 PM      Component Value Range Status Comment   Specimen Description BLOOD LEFT ANTECUBITAL   Final  Special Requests BOTTLES DRAWN AEROBIC AND ANAEROBIC 18CC EACH   Final    Culture NO GROWTH 5 DAYS   Final    Report Status 10/21/2011 FINAL   Final   CULTURE, BLOOD (ROUTINE X 2)     Status: Normal   Collection Time   10/16/11  9:49 PM      Component Value Range  Status Comment   Specimen Description BLOOD RIGHT ANTECUBITAL DRAWN BY RN   Final    Special Requests     Final    Value: BOTTLES DRAWN AEROBIC AND ANAEROBIC AEB=5CC ANA=6CC   Culture NO GROWTH 5 DAYS   Final    Report Status 10/21/2011 FINAL   Final   MRSA PCR SCREENING     Status: Normal   Collection Time   10/20/11  3:51 PM      Component Value Range Status Comment   MRSA by PCR NEGATIVE  NEGATIVE Final   CULTURE, EXPECTORATED SPUTUM-ASSESSMENT     Status: Normal   Collection Time   10/22/11  5:55 AM      Component Value Range Status Comment   Specimen Description SPUTUM   Final    Special Requests NONE   Final    Sputum evaluation     Final    Value: MICROSCOPIC FINDINGS SUGGEST THAT THIS SPECIMEN IS NOT REPRESENTATIVE OF LOWER RESPIRATORY SECRETIONS. PLEASE RECOLLECT.     CALLED TO TOLER M. AT 1610R ON [id number] BY THOMPSON S.   Report Status 10/22/2011 FINAL   Final   CULTURE, RESPIRATORY     Status: Normal (Preliminary result)   Collection Time   10/22/11  9:15 PM      Component Value Range Status Comment   Specimen Description SPUTUM   Final    Special Requests NONE   Final    Gram Stain     Final    Value: NO WBC SEEN     NO SQUAMOUS EPITHELIAL CELLS SEEN     RARE YEAST   Culture PENDING   Incomplete    Report Status PENDING   Incomplete    Studies/Results: No results found.  Medications:  Scheduled:   . albuterol  2.5 mg Nebulization QID  . amLODipine  5 mg Oral Daily  . aspirin  325 mg Oral Daily  . citalopram  20 mg Oral Daily  . fluticasone  1 spray Each Nare Daily  . Fluticasone-Salmeterol  1 puff Inhalation BID  . guaiFENesin  600 mg Oral BID  . guaiFENesin-dextromethorphan  5 mL Oral Q4H  . insulin aspart  0-20 Units Subcutaneous TID WC  . insulin aspart  0-5 Units Subcutaneous QHS  . insulin detemir  10 Units Subcutaneous Daily  . levofloxacin  500 mg Oral Daily  . metFORMIN  500 mg Oral BID WC  . methylPREDNISolone (SOLU-MEDROL) injection  80 mg  Intravenous Q6H  . nicotine  14 mg Transdermal Daily  . pantoprazole  40 mg Oral Q1200  . piperacillin-tazobactam (ZOSYN)  IV  3.375 g Intravenous Q8H  . tiotropium  18 mcg Inhalation Daily  . vancomycin  2,000 mg Intravenous Q12H  . DISCONTD: vancomycin  1,000 mg Intravenous Q12H   Continuous:  JWJ:[id number], oxyCODONE-acetaminophen  Assesment: She has pneumonia, respiratory failure which is multifactorial and COPD. She is improving. She still has congestion in her chest and her oxygen requirement is decreasing Principal Problem:  *PNA (pneumonia) Active Problems:  Microcytic anemia  HTN (hypertension)  DM type 2 (diabetes mellitus, type 2)  Tobacco abuse  COPD (chronic obstructive pulmonary disease)  Chronic respiratory failure  Oxygen dependent  Changing mole right temporal area    Plan: No change in treatments continue to try to wean her oxygen    LOS: 8 days   Einer Meals L 10/24/2011, 8:34 AM

## 2011-10-24 NOTE — Progress Notes (Signed)
Subjective: Patient reports that she is feeling better.  Her work of breathing is improving, she is coughing, requiring less oxygen  Objective: Vital signs in last 24 hours: Temp:  [97.8 F (36.6 C)] 97.8 F (36.6 C) (07/02 0620) Pulse Rate:  [70] 70  (07/02 0620) Resp:  [20] 20  (07/02 0620) BP: (145)/(80) 145/80 mmHg (07/02 0620) SpO2:  [94 %-97 %] 97 % (07/02 1111) Weight change:  Last BM Date: 10/24/11  Intake/Output from previous day: 07/01 0701 - 07/02 0700 In: 1760 [P.O.:960; IV Piggyback:800] Out: -  Total I/O In: 300 [P.O.:300] Out: -    Physical Exam: General: Alert, awake, oriented x3, in no acute distress. HEENT: No bruits, no goiter. Heart: Regular rate and rhythm, without murmurs, rubs, gallops. Lungs: bilateral rhochi and mild wheeze Abdomen: Soft, nontender, nondistended, positive bowel sounds. Extremities: No clubbing cyanosis or edema with positive pedal pulses. Neuro: Grossly intact, nonfocal.    Lab Results: Basic Metabolic Panel:  Basename 10/24/11 0446 10/22/11 0513  NA 135 136  K 3.9 3.9  CL 94* 96  CO2 32 32  GLUCOSE 242* 233*  BUN 14 14  CREATININE 0.49* 0.52  CALCIUM 8.7 9.1  MG -- --  PHOS -- --   Liver Function Tests: No results found for this basename: AST:2,ALT:2,ALKPHOS:2,BILITOT:2,PROT:2,ALBUMIN:2 in the last 72 hours No results found for this basename: LIPASE:2,AMYLASE:2 in the last 72 hours No results found for this basename: AMMONIA:2 in the last 72 hours CBC:  Basename 10/24/11 0446 10/22/11 0513  WBC 15.0* 12.2*  NEUTROABS -- --  HGB 9.6* 9.7*  HCT 31.7* 32.2*  MCV 76.8* 74.2*  PLT 319 256   Cardiac Enzymes: No results found for this basename: CKTOTAL:3,CKMB:3,CKMBINDEX:3,TROPONINI:3 in the last 72 hours BNP: No results found for this basename: PROBNP:3 in the last 72 hours D-Dimer: No results found for this basename: DDIMER:2 in the last 72 hours CBG:  Basename 10/24/11 1116 10/24/11 0710 10/23/11 2133  10/23/11 1640 10/23/11 1135 10/23/11 0740  GLUCAP 259* 216* 327* 198* 337* 250*   Hemoglobin A1C: No results found for this basename: HGBA1C in the last 72 hours Fasting Lipid Panel: No results found for this basename: CHOL,HDL,LDLCALC,TRIG,CHOLHDL,LDLDIRECT in the last 72 hours Thyroid Function Tests: No results found for this basename: TSH,T4TOTAL,FREET4,T3FREE,THYROIDAB in the last 72 hours Anemia Panel: No results found for this basename: VITAMINB12,FOLATE,FERRITIN,TIBC,IRON,RETICCTPCT in the last 72 hours Coagulation: No results found for this basename: LABPROT:2,INR:2 in the last 72 hours Urine Drug Screen: Drugs of Abuse     Component Value Date/Time   LABOPIA POSITIVE* 10/28/2007 2113   COCAINSCRNUR NONE DETECTED 10/28/2007 2113   LABBENZ NONE DETECTED 10/28/2007 2113   AMPHETMU NONE DETECTED 10/28/2007 2113   THCU POSITIVE* 10/28/2007 2113   LABBARB  Value: NONE DETECTED        DRUG SCREEN FOR MEDICAL PURPOSES ONLY.  IF CONFIRMATION IS NEEDED FOR ANY PURPOSE, NOTIFY LAB WITHIN 5 DAYS. 10/28/2007 2113    Alcohol Level: No results found for this basename: ETH:2 in the last 72 hours Urinalysis: No results found for this basename: COLORURINE:2,APPERANCEUR:2,LABSPEC:2,PHURINE:2,GLUCOSEU:2,HGBUR:2,BILIRUBINUR:2,KETONESUR:2,PROTEINUR:2,UROBILINOGEN:2,NITRITE:2,LEUKOCYTESUR:2 in the last 72 hours  Recent Results (from the past 240 hour(s))  CULTURE, BLOOD (ROUTINE X 2)     Status: Normal   Collection Time   10/16/11  9:43 PM      Component Value Range Status Comment   Specimen Description BLOOD LEFT ANTECUBITAL   Final    Special Requests BOTTLES DRAWN AEROBIC AND ANAEROBIC 18CC EACH   Final  Culture NO GROWTH 5 DAYS   Final    Report Status 10/21/2011 FINAL   Final   CULTURE, BLOOD (ROUTINE X 2)     Status: Normal   Collection Time   10/16/11  9:49 PM      Component Value Range Status Comment   Specimen Description BLOOD RIGHT ANTECUBITAL DRAWN BY RN   Final    Special Requests      Final    Value: BOTTLES DRAWN AEROBIC AND ANAEROBIC AEB=5CC ANA=6CC   Culture NO GROWTH 5 DAYS   Final    Report Status 10/21/2011 FINAL   Final   MRSA PCR SCREENING     Status: Normal   Collection Time   10/20/11  3:51 PM      Component Value Range Status Comment   MRSA by PCR NEGATIVE  NEGATIVE Final   CULTURE, EXPECTORATED SPUTUM-ASSESSMENT     Status: Normal   Collection Time   10/22/11  5:55 AM      Component Value Range Status Comment   Specimen Description SPUTUM   Final    Special Requests NONE   Final    Sputum evaluation     Final    Value: MICROSCOPIC FINDINGS SUGGEST THAT THIS SPECIMEN IS NOT REPRESENTATIVE OF LOWER RESPIRATORY SECRETIONS. PLEASE RECOLLECT.     CALLED TO TOLER M. AT 1610R ON [id number] BY THOMPSON S.   Report Status 10/22/2011 FINAL   Final   CULTURE, RESPIRATORY     Status: Normal (Preliminary result)   Collection Time   10/22/11  9:15 PM      Component Value Range Status Comment   Specimen Description SPUTUM   Final    Special Requests NONE   Final    Gram Stain     Final    Value: NO WBC SEEN     NO SQUAMOUS EPITHELIAL CELLS SEEN     RARE YEAST   Culture PENDING   Incomplete    Report Status PENDING   Incomplete     Studies/Results: No results found.  Medications: Scheduled Meds:   . albuterol  2.5 mg Nebulization QID  . amLODipine  5 mg Oral Daily  . aspirin  325 mg Oral Daily  . citalopram  20 mg Oral Daily  . fluticasone  1 spray Each Nare Daily  . Fluticasone-Salmeterol  1 puff Inhalation BID  . guaiFENesin  600 mg Oral BID  . guaiFENesin-dextromethorphan  5 mL Oral Q4H  . insulin aspart  0-20 Units Subcutaneous TID WC  . insulin aspart  0-5 Units Subcutaneous QHS  . insulin detemir  10 Units Subcutaneous Daily  . levofloxacin  500 mg Oral Daily  . metFORMIN  500 mg Oral BID WC  . methylPREDNISolone (SOLU-MEDROL) injection  80 mg Intravenous Q6H  . nicotine  14 mg Transdermal Daily  . pantoprazole  40 mg Oral Q1200  .  piperacillin-tazobactam (ZOSYN)  IV  3.375 g Intravenous Q8H  . tiotropium  18 mcg Inhalation Daily  . vancomycin  2,000 mg Intravenous Q12H   Continuous Infusions:  PRN Meds:.albuterol, oxyCODONE-acetaminophen  Assessment/Plan:  Principal Problem:  *PNA (pneumonia) Active Problems:  Microcytic anemia  HTN (hypertension)  DM type 2 (diabetes mellitus, type 2)  Tobacco abuse  COPD (chronic obstructive pulmonary disease)  Chronic respiratory failure  Oxygen dependent  Changing mole right temporal area  Plan:  Patient has had slow but steady progress.  We will de-escalate her antibiotics to levaquin.  Will decrease frequency of steroids.  Continue pulmonary toilet, patient will be ambulated today.  Will continue to wean down oxygen as tolerated.  I suspect she will be getting ready for discharge home in the next 1-2 days   LOS: 8 days   Cougar Imel [facility] Pager: [phone] 10/24/2011, 3:29 PM

## 2011-10-25 ENCOUNTER — Telehealth: Payer: Self-pay | Admitting: Family Medicine

## 2011-10-25 LAB — GLUCOSE, CAPILLARY
Glucose-Capillary: 214 mg/dL — ABNORMAL HIGH (ref 70–99)
Glucose-Capillary: 244 mg/dL — ABNORMAL HIGH (ref 70–99)

## 2011-10-25 MED ORDER — ALBUTEROL SULFATE (5 MG/ML) 0.5% IN NEBU: 2.5000 mg | INHALATION_SOLUTION | Freq: Four times a day (QID) | RESPIRATORY_TRACT | Status: DC

## 2011-10-25 MED ORDER — LEVOFLOXACIN 500 MG PO TABS: 500.0000 mg | ORAL_TABLET | Freq: Every day | ORAL | Status: AC

## 2011-10-25 MED ORDER — FLUTICASONE-SALMETEROL 250-50 MCG/DOSE IN AEPB: 1.0000 | INHALATION_SPRAY | Freq: Two times a day (BID) | RESPIRATORY_TRACT | Status: DC

## 2011-10-25 MED ORDER — GUAIFENESIN ER 600 MG PO TB12: 600.0000 mg | ORAL_TABLET | Freq: Two times a day (BID) | ORAL | Status: DC

## 2011-10-25 MED ORDER — PREDNISONE 10 MG PO TABS: ORAL_TABLET | ORAL | Status: AC

## 2011-10-25 MED ORDER — TIOTROPIUM BROMIDE MONOHYDRATE 18 MCG IN CAPS: 18.0000 ug | ORAL_CAPSULE | Freq: Every day | RESPIRATORY_TRACT | Status: DC

## 2011-10-25 MED ORDER — NICOTINE 14 MG/24HR TD PT24: 1.0000 | MEDICATED_PATCH | Freq: Every day | TRANSDERMAL | Status: AC

## 2011-10-25 NOTE — Progress Notes (Signed)
D/c instructions reviewed with patient. Verbalized understanding. Pt dc'd to home with family.  Schonewitz, Candelaria Stagers 10/25/2011

## 2011-10-25 NOTE — Discharge Summary (Signed)
Physician Discharge Summary  Patient ID: [name] MRN: [id number] DOB/AGE: 46/12/1965 45 y.o.  Admit date: 10/16/2011 Discharge date: 10/25/2011  Primary Care Physician:  Milinda Antis, MD   Discharge Diagnoses:    Principal Problem:  *PNA (pneumonia) Active Problems:  Microcytic anemia  HTN (hypertension)  DM type 2 (diabetes mellitus, type 2)  Tobacco abuse  COPD (chronic obstructive pulmonary disease)  Chronic respiratory failure  Oxygen dependent  Changing mole right temporal area    Medication List  As of 10/25/2011 12:37 PM   STOP taking these medications         ACCU-CHEK AVIVA PLUS W/DEVICE Kit      glucose blood test strip         TAKE these medications         accu-chek multiclix lancets   Use as directed once daily with accu chek aviva meter      albuterol 108 (90 BASE) MCG/ACT inhaler   Commonly known as: PROVENTIL HFA;VENTOLIN HFA   Inhale 2 puffs into the lungs every 4 (four) hours as needed for wheezing or shortness of breath. Wheezing/shortnes of breath      albuterol (5 MG/ML) 0.5% nebulizer solution   Commonly known as: PROVENTIL   Take 0.5 mLs (2.5 mg total) by nebulization 4 (four) times daily.      aspirin 325 MG tablet   Take 325 mg by mouth daily as needed. AS FEVER REDUCER      citalopram 20 MG tablet   Commonly known as: CELEXA   Take 1 tablet (20 mg total) by mouth daily.      Fluticasone-Salmeterol 250-50 MCG/DOSE Aepb   Commonly known as: ADVAIR   Inhale 1 puff into the lungs 2 (two) times daily.      guaiFENesin 600 MG 12 hr tablet   Commonly known as: MUCINEX   Take 1 tablet (600 mg total) by mouth 2 (two) times daily.      ibuprofen 200 MG tablet   Commonly known as: ADVIL,MOTRIN   Take 200-400 mg by mouth daily as needed. For pain      levofloxacin 500 MG tablet   Commonly known as: LEVAQUIN   Take 1 tablet (500 mg total) by mouth daily.      lisinopril 5 MG tablet   Commonly known as: PRINIVIL,ZESTRIL   Take 1 tablet (5 mg total) by mouth daily.      metFORMIN 500 MG tablet   Commonly known as: GLUCOPHAGE   TAKE ONE TABLET BY MOUTH TWICE DAILY WITH MEALS      nicotine 14 mg/24hr patch   Commonly known as: NICODERM CQ - dosed in mg/24 hours   Place 1 patch onto the skin daily.      omeprazole 20 MG capsule   Commonly known as: PRILOSEC   Take 1 capsule (20 mg total) by mouth daily.      oxyCODONE-acetaminophen 5-325 MG per tablet   Commonly known as: PERCOCET   Take 1 tablet by mouth 2 (two) times daily as needed for pain.      predniSONE 10 MG tablet   Commonly known as: DELTASONE   Take 60mg  po daily for 2 days then 40mg  po daily for 3 days then 30mg  po daily for 3 days then 20mg  po daily for 3 days then 10mg  po daily for 3 days then stop      ROBITUSSIN CF PO   Take 5-10 mLs by mouth as needed. For cough  tiotropium 18 MCG inhalation capsule   Commonly known as: SPIRIVA   Place 1 capsule (18 mcg total) into inhaler and inhale daily.           Discharge Exam: Blood pressure 134/74, pulse 73, temperature 97.8 F (36.6 C), temperature source Oral, resp. rate 18, height 5\' 4"  (1.626 m), weight 122.8 kg (270 lb 11.6 oz), last menstrual period 10/09/2011, SpO2 98.00%. NAD Mild wheeze b/l, significantly improved S1, s2, rrr Soft, NT, BS+ No edema b/l  Disposition and Follow-up:  Patient will be discharged home today. She will be set up with home health services, specifically the advanced home care COPD program.  Follow up with primary care doctor in 2 weeks  Consults: Pulmonology, Dr. Juanetta Gosling   Significant Diagnostic Studies:  No results found.  Brief H and P: For complete details please refer to admission H and P, but in brief 46 year old female with 3 days of progressive worsening fever cough and wheezing. She is chronically oxygen dependent at home for the past year for COPD. She has decreased by mouth intake without any nausea vomiting. She denies any dysuria  or abdominal pain.   Hospital Course:  Principal Problem:  *PNA (pneumonia) Active Problems:  Microcytic anemia  HTN (hypertension)  DM type 2 (diabetes mellitus, type 2)  Tobacco abuse  COPD (chronic obstructive pulmonary disease)  Chronic respiratory failure  Oxygen dependent  Changing mole right temporal area  This lady was admitted to the hospital with pneumonia and COPD exacerbation.  She was started on empiric abx and steroids.  She was admitted to the medical floor and had initially done well, but then developed  Worsening shortness of breath.  Chest xray revealed worsening bilateral infiltrates. She required transfer to the step down unit and was placed on 100% oxygen. Fortunately she did not require bipap or intubation.  She was followed by Dr. Juanetta Gosling.  Her antibiotic coverage was broadened and her steroids were increased.  She slowly began to improve and is not back down to her chronic oxygen requirement of 2.5L/min.  She is breathing significantly better and has been ambulating on the inpatient unit without difficulty.  She feels that she is ready to go home.  Her steroids have been changed to prednisone and she will complete her antibiotics course with oral meds. She will be set up with advanced home care COPD program. She has been strongly advised to quit smoking.    During her hospital stay she was noted to be significantly anemic which was felt to be due menorrhagia.  It was felt that this anemia was contributing to her shortness of breath.  She was transfused 2 units of PRBC during this admission.  She has not had any signs of bleeding and her hemoglobin is stable.  Time spent on Discharge:  Signed: Bryse Blanchette [facility] Pager: [phone] 10/25/2011, 12:37 PM

## 2011-10-26 LAB — CULTURE, RESPIRATORY: Gram Stain: NONE SEEN

## 2011-10-26 LAB — CULTURE, RESPIRATORY W GRAM STAIN

## 2011-10-30 ENCOUNTER — Telehealth: Payer: Self-pay | Admitting: Family Medicine

## 2011-10-30 MED ORDER — ALBUTEROL SULFATE HFA 108 (90 BASE) MCG/ACT IN AERS: 2.0000 | INHALATION_SPRAY | RESPIRATORY_TRACT | Status: DC | PRN

## 2011-10-30 NOTE — Telephone Encounter (Signed)
Advised she could come to office to colelct narc tomorrow. Also wanted to know if the diflucan could be refilled?

## 2011-10-31 ENCOUNTER — Other Ambulatory Visit: Payer: Self-pay

## 2011-10-31 MED ORDER — FLUCONAZOLE 150 MG PO TABS: 150.0000 mg | ORAL_TABLET | Freq: Once | ORAL | Status: AC

## 2011-10-31 MED ORDER — OXYCODONE-ACETAMINOPHEN 5-325 MG PO TABS: 1.0000 | ORAL_TABLET | Freq: Two times a day (BID) | ORAL | Status: DC | PRN

## 2011-10-31 NOTE — Telephone Encounter (Signed)
Pt in to pick up regular rx

## 2011-10-31 NOTE — Telephone Encounter (Signed)
meds refilled 

## 2011-10-31 NOTE — Addendum Note (Signed)
Addended by: Milinda Antis F on: 10/31/2011 12:23 PM   Modules accepted: Orders

## 2011-11-06 ENCOUNTER — Other Ambulatory Visit: Payer: Self-pay | Admitting: Family Medicine

## 2011-11-06 MED ORDER — FLUCONAZOLE 150 MG PO TABS: ORAL_TABLET | ORAL | Status: AC

## 2011-11-06 NOTE — Telephone Encounter (Signed)
Call came in on 10/26/2011, staiting pt had just beeen hospitalized for pneumonia, currently on antibiotic, now with c/o oral thrush. Request made for med for this , fluconazole called in to [facility]

## 2011-11-09 ENCOUNTER — Encounter: Payer: Self-pay | Admitting: Family Medicine

## 2011-11-09 ENCOUNTER — Ambulatory Visit (INDEPENDENT_AMBULATORY_CARE_PROVIDER_SITE_OTHER): Payer: Medicaid Other | Admitting: Family Medicine

## 2011-11-09 VITALS — BP 130/70 | HR 90 | Resp 18 | Ht 64.0 in | Wt 252.1 lb

## 2011-11-09 DIAGNOSIS — D239 Other benign neoplasm of skin, unspecified: Secondary | ICD-10-CM

## 2011-11-09 DIAGNOSIS — D229 Melanocytic nevi, unspecified: Secondary | ICD-10-CM

## 2011-11-09 DIAGNOSIS — F17200 Nicotine dependence, unspecified, uncomplicated: Secondary | ICD-10-CM

## 2011-11-09 DIAGNOSIS — N920 Excessive and frequent menstruation with regular cycle: Secondary | ICD-10-CM

## 2011-11-09 DIAGNOSIS — D509 Iron deficiency anemia, unspecified: Secondary | ICD-10-CM

## 2011-11-09 DIAGNOSIS — E119 Type 2 diabetes mellitus without complications: Secondary | ICD-10-CM

## 2011-11-09 DIAGNOSIS — J449 Chronic obstructive pulmonary disease, unspecified: Secondary | ICD-10-CM

## 2011-11-09 DIAGNOSIS — Z720 Tobacco use: Secondary | ICD-10-CM

## 2011-11-09 DIAGNOSIS — M549 Dorsalgia, unspecified: Secondary | ICD-10-CM

## 2011-11-09 DIAGNOSIS — J441 Chronic obstructive pulmonary disease with (acute) exacerbation: Secondary | ICD-10-CM

## 2011-11-09 DIAGNOSIS — D649 Anemia, unspecified: Secondary | ICD-10-CM

## 2011-11-09 DIAGNOSIS — I10 Essential (primary) hypertension: Secondary | ICD-10-CM

## 2011-11-09 MED ORDER — ALBUTEROL SULFATE HFA 108 (90 BASE) MCG/ACT IN AERS: 2.0000 | INHALATION_SPRAY | RESPIRATORY_TRACT | Status: DC | PRN

## 2011-11-09 MED ORDER — PREDNISONE 10 MG PO TABS: ORAL_TABLET | ORAL | Status: DC

## 2011-11-09 MED ORDER — GABAPENTIN 300 MG PO CAPS: 300.0000 mg | ORAL_CAPSULE | Freq: Three times a day (TID) | ORAL | Status: DC

## 2011-11-09 NOTE — Progress Notes (Signed)
  Subjective:    Patient ID: [name], female    DOB: 02/28/1966, 46 y.o.   MRN: [id number]  HPI Pt presents for hospital follow-up, admitted for pneumonia and COPD exacerbations. Treated with antibiotics and steroids, sent home on steriod taper she is now down to 1 tablet daily but her breathing has worsened the past few days. She feels her chest is tighter and her cough is worse since she stepped down on the prednisone. COPD HH has been established She has a spot on her face that has been growing for the past few years that she would like to have checked Chronic back pain unchanged, she now has medicaid and would like to have this evaluated She was transfused 2 units of blood, for therapuetic relief, she had no active bleeding and Hb was between 9-10 however with her respiratory status it was felt she may improve if transfusion was given. She has very heavy cycles and is overdue for PAP Smear  DM- CBG have been 20 fasting, 160-200 in evening on prednisone  Review of Systems  GEN- denies fatigue, fever, weight loss,weakness, recent illness HEENT- denies eye drainage, change in vision, nasal discharge, CVS- denies chest pain, palpitations RESP- + SOB, +cough, +wheeze ABD- denies N/V, change in stools, abd pain GU- denies dysuria, hematuria, dribbling, incontinence MSK-+joint pain, muscle aches, injury Neuro- denies headache, dizziness, syncope, seizure activity      Objective:   Physical Exam GEN- NAD, alert and oriented x3, obese  HEENT- PERRL, EOMI, non injected sclera, pink conjunctiva, MMM, oropharynx clear Neck- Supple, no thryomegaly CVS- RRR, no murmur RESP- bilateral wheeze, normal WOB, no rhonchi , Oxygen 2.5L ABD-NABS,soft,NT,ND EXT- No edema Pulses- Radial, DP- 2+ Skin- 4cm circular hyperpigemented macule with some central fading on right temple        Assessment & Plan:

## 2011-11-09 NOTE — Patient Instructions (Addendum)
Referral to GYN for heavy periods Referral to Dermatology  Referral to Dr. Juanetta Gosling- pulmonary specialist Increase the prednisone back to 30mg  and slow taper off Restart the neurontin at bedtime- 300mg  x 1 week, increase 300mg  twice a day x 1 week, up to three times a day  Labs to be done  F/U 1 week recheck of COPD

## 2011-11-10 LAB — CBC
HCT: 34 % — ABNORMAL LOW (ref 36.0–46.0)
Hemoglobin: 10.5 g/dL — ABNORMAL LOW (ref 12.0–15.0)
MCH: 21.4 pg — ABNORMAL LOW (ref 26.0–34.0)
MCHC: 30.9 g/dL (ref 30.0–36.0)
MCV: 69.2 fL — ABNORMAL LOW (ref 78.0–100.0)
Platelets: 205 10*3/uL (ref 150–400)
RBC: 4.91 MIL/uL (ref 3.87–5.11)
RDW: 23.8 % — ABNORMAL HIGH (ref 11.5–15.5)
WBC: 14.9 10*3/uL — ABNORMAL HIGH (ref 4.0–10.5)

## 2011-11-10 LAB — BASIC METABOLIC PANEL
BUN: 7 mg/dL (ref 6–23)
CO2: 32 mEq/L (ref 19–32)
Calcium: 9 mg/dL (ref 8.4–10.5)
Chloride: 99 mEq/L (ref 96–112)
Creat: 0.66 mg/dL (ref 0.50–1.10)
Glucose, Bld: 127 mg/dL — ABNORMAL HIGH (ref 70–99)
Potassium: 4.5 mEq/L (ref 3.5–5.3)
Sodium: 136 mEq/L (ref 135–145)

## 2011-11-10 LAB — HEMOGLOBIN A1C
Hgb A1c MFr Bld: 6.7 % — ABNORMAL HIGH (ref <5.7)
Mean Plasma Glucose: 146 mg/dL — ABNORMAL HIGH (ref <117)

## 2011-11-10 LAB — IRON: Iron: <10 ug/dL — ABNORMAL LOW (ref 42–145)

## 2011-11-12 DIAGNOSIS — N920 Excessive and frequent menstruation with regular cycle: Secondary | ICD-10-CM | POA: Insufficient documentation

## 2011-11-12 DIAGNOSIS — J441 Chronic obstructive pulmonary disease with (acute) exacerbation: Secondary | ICD-10-CM | POA: Insufficient documentation

## 2011-11-12 NOTE — Assessment & Plan Note (Signed)
Heavy painful menses, due for PAP , send to GYN for evaluation

## 2011-11-12 NOTE — Assessment & Plan Note (Signed)
S/p transfusion check iron level, CBC

## 2011-11-12 NOTE — Assessment & Plan Note (Signed)
At goal, no change to meds 

## 2011-11-12 NOTE — Assessment & Plan Note (Signed)
Refer to dermatology 

## 2011-11-12 NOTE — Assessment & Plan Note (Signed)
Restart neurontin now that insurance re-established

## 2011-11-12 NOTE — Assessment & Plan Note (Signed)
Her breathing has deteriorated with taper in prednisone, will increase back to 30mg  and provide a slower taper down Oxygen dependent. Referral to pulmonary Continue nebs, advair, spiriva

## 2011-11-12 NOTE — Assessment & Plan Note (Signed)
Tobacco cessation counseling done

## 2011-11-16 ENCOUNTER — Ambulatory Visit (INDEPENDENT_AMBULATORY_CARE_PROVIDER_SITE_OTHER): Payer: Medicaid Other | Admitting: Family Medicine

## 2011-11-16 ENCOUNTER — Encounter: Payer: Self-pay | Admitting: Family Medicine

## 2011-11-16 VITALS — BP 130/70 | HR 111 | Resp 16 | Ht 64.0 in | Wt 257.0 lb

## 2011-11-16 DIAGNOSIS — J441 Chronic obstructive pulmonary disease with (acute) exacerbation: Secondary | ICD-10-CM

## 2011-11-16 DIAGNOSIS — F17200 Nicotine dependence, unspecified, uncomplicated: Secondary | ICD-10-CM

## 2011-11-16 DIAGNOSIS — E119 Type 2 diabetes mellitus without complications: Secondary | ICD-10-CM

## 2011-11-16 DIAGNOSIS — I10 Essential (primary) hypertension: Secondary | ICD-10-CM

## 2011-11-16 DIAGNOSIS — M549 Dorsalgia, unspecified: Secondary | ICD-10-CM

## 2011-11-16 DIAGNOSIS — Z720 Tobacco use: Secondary | ICD-10-CM

## 2011-11-16 MED ORDER — LISINOPRIL 10 MG PO TABS: 10.0000 mg | ORAL_TABLET | Freq: Every day | ORAL | Status: DC

## 2011-11-16 MED ORDER — OXYCODONE-ACETAMINOPHEN 5-325 MG PO TABS: 1.0000 | ORAL_TABLET | Freq: Three times a day (TID) | ORAL | Status: DC | PRN

## 2011-11-16 NOTE — Assessment & Plan Note (Signed)
Elevated BP in home setting, increase lisinopril to 10mg 

## 2011-11-16 NOTE — Patient Instructions (Signed)
For your breathing continue your inhalers follow-up with Lung doctor in [place]  Take the one day with iron Pain medication increased to 90 tablets  Lisinopril 10mg  for blood pressure  F/U As previous in September

## 2011-11-16 NOTE — Assessment & Plan Note (Signed)
Increase pain meds to 90 tablets

## 2011-11-16 NOTE — Progress Notes (Signed)
  Subjective:    Patient ID: [name], female    DOB: [birth year], 46 y.o.   MRN: [id number]  HPI Patient here to followup for interim visit for COPD. She continues to have cough. She appt scheduled with pulmonary. She has run out of cough meds and is using pain meds more, for her side pain and back pain.  Denies fever CBG- have been low 120's fasting HTN- her HHRN states BP has been 150's systolic or 80's    Review of Systems GEN- denies fatigue, fever, weight loss,weakness, recent illness HEENT- denies eye drainage, change in vision, nasal discharge, CVS- denies chest pain, palpitations RESP- denies SOB,+ cough, +wheeze ABD- denies N/V, change in stools, abd pain GU- denies dysuria, hematuria, dribbling, incontinence MSK- + joint pain, muscle aches, injury Neuro- denies headache, dizziness, syncope, seizure activity       Objective:   Physical Exam  GEN- NAD, alert and oriented x3, obese  HEENT-I, non injected sclera, pink conjunctiva, MMM, oropharynx clear CVS- RRR, no murmur RESP- mild rhonchi bilat bases that clear with cough,  normal WOB,no wheeze, Oxygen 2.5L EXT- No edema Pulses- Radial, DP- 2+      Assessment & Plan:

## 2011-11-16 NOTE — Assessment & Plan Note (Signed)
A1C at goal, continue glucaphage

## 2011-11-16 NOTE — Assessment & Plan Note (Signed)
Unchanged, continue to reiterate need to quit, pt is to try electronic cigarrette

## 2011-11-16 NOTE — Assessment & Plan Note (Signed)
Improved, continue inhalers, f/u pulmonary , oxygen dependent

## 2011-12-08 ENCOUNTER — Other Ambulatory Visit: Payer: Self-pay | Admitting: Obstetrics & Gynecology

## 2011-12-08 ENCOUNTER — Other Ambulatory Visit (HOSPITAL_COMMUNITY)
Admission: RE | Admit: 2011-12-08 | Discharge: 2011-12-08 | Disposition: A | Discharge status: Home / Self Care | Payer: Medicaid Other | Source: Ambulatory Visit | Attending: Obstetrics & Gynecology | Admitting: Obstetrics & Gynecology

## 2011-12-08 DIAGNOSIS — Z01419 Encounter for gynecological examination (general) (routine) without abnormal findings: Secondary | ICD-10-CM | POA: Insufficient documentation

## 2011-12-12 ENCOUNTER — Institutional Professional Consult (permissible substitution): Payer: Medicaid Other | Admitting: Internal Medicine

## 2011-12-14 ENCOUNTER — Telehealth: Payer: Self-pay | Admitting: Family Medicine

## 2011-12-15 ENCOUNTER — Telehealth: Payer: Self-pay | Admitting: Family Medicine

## 2011-12-15 ENCOUNTER — Other Ambulatory Visit: Payer: Self-pay

## 2011-12-15 ENCOUNTER — Other Ambulatory Visit: Payer: Self-pay | Admitting: Family Medicine

## 2011-12-15 MED ORDER — OXYCODONE-ACETAMINOPHEN 5-325 MG PO TABS: 1.0000 | ORAL_TABLET | Freq: Three times a day (TID) | ORAL | Status: DC | PRN

## 2011-12-15 NOTE — Telephone Encounter (Signed)
Pt can collect script this afternoon.

## 2011-12-15 NOTE — Telephone Encounter (Signed)
Patient is aware 

## 2011-12-19 ENCOUNTER — Other Ambulatory Visit: Payer: Self-pay | Admitting: Family Medicine

## 2011-12-22 ENCOUNTER — Ambulatory Visit (INDEPENDENT_AMBULATORY_CARE_PROVIDER_SITE_OTHER): Payer: Medicaid Other | Admitting: Internal Medicine

## 2011-12-22 ENCOUNTER — Encounter: Payer: Self-pay | Admitting: Internal Medicine

## 2011-12-22 VITALS — BP 120/66 | HR 90 | Temp 98.2°F | Ht 64.0 in | Wt 259.6 lb

## 2011-12-22 DIAGNOSIS — R918 Other nonspecific abnormal finding of lung field: Secondary | ICD-10-CM | POA: Insufficient documentation

## 2011-12-22 DIAGNOSIS — J449 Chronic obstructive pulmonary disease, unspecified: Secondary | ICD-10-CM

## 2011-12-22 DIAGNOSIS — Z720 Tobacco use: Secondary | ICD-10-CM

## 2011-12-22 DIAGNOSIS — F17200 Nicotine dependence, unspecified, uncomplicated: Secondary | ICD-10-CM

## 2011-12-22 DIAGNOSIS — I10 Essential (primary) hypertension: Secondary | ICD-10-CM

## 2011-12-22 HISTORY — DX: Other nonspecific abnormal finding of lung field: R91.8

## 2011-12-22 MED ORDER — MOMETASONE FURO-FORMOTEROL FUM 100-5 MCG/ACT IN AERO: 2.0000 | INHALATION_SPRAY | Freq: Two times a day (BID) | RESPIRATORY_TRACT | Status: DC

## 2011-12-22 MED ORDER — RANITIDINE HCL 150 MG PO TABS: 150.0000 mg | ORAL_TABLET | Freq: Two times a day (BID) | ORAL | Status: DC

## 2011-12-22 NOTE — Patient Instructions (Addendum)
Prilosec Take 30-60 min before first meal of the day and Zantac 150 mg one at bedtime  Stop lisinopril and start benicar 20 mg one daily (ok to take one half if too strong) - you should be able to taper off your cough medication soon   Stop advair and spiriva  Dulera 100 Take 2 puffs first thing in am and then another 2 puffs about 12 hours later.    Only use your albuterol as a rescue medication to be used if you can't catch your breath by resting or doing a relaxed purse lip breathing pattern. The less you use it, the better it will work when you need it.   Please schedule a follow up office visit in 4 weeks, sooner if needed with pfts and cxr

## 2011-12-22 NOTE — Assessment & Plan Note (Signed)
Very unlikley this is recurrent CAP - ? Could she have RBILD from smoking ? Could she be using cocaine or other surreptitious agents causing recurring lung injury.  For now she appears to doing fine x for the cough which does not occur reproducibly on inps with sat 99% RA so will just follow up with cxr in 6 weeks and pft's same day planned

## 2011-12-22 NOTE — Progress Notes (Signed)
  Subjective:    Patient ID: [name], female    DOB: 12-24-1965  MRN: [id number]  HPI  6 yowf active smoker with pna as 46 year old but then could keep up with classmates fine at PE but developed recurrent exacerbations of cough starting in 2010 and referred to pulmonary clinic 12/22/2011 by Dr Jeanice Lim p 3 admissions to [facility] since Nov 2012 with dx of pna and refractory cough and sob since then.   12/22/2011 1st pulmonary eval on ACEI  cc doe x walking outside in heat but does ok indoors, very sedentary, also  bad cough when not taking cough medications coughs so hard she vomits, more productive in am thick yellow, never bloody. Not using nebs x sev weeks.    Also denies any obvious fluctuation of symptoms with weather or environmental changes or other aggravating or alleviating factors except as outlined above     Review of Systems  Constitutional: Negative for fever, chills and unexpected weight change.  HENT: Positive for ear pain. Negative for nosebleeds, congestion, sore throat, rhinorrhea, sneezing, trouble swallowing, dental problem, voice change, postnasal drip and sinus pressure.   Eyes: Negative for visual disturbance.  Respiratory: Positive for cough and shortness of breath. Negative for choking.   Cardiovascular: Positive for chest pain and leg swelling.  Gastrointestinal: Positive for abdominal pain. Negative for vomiting and diarrhea.  Genitourinary: Negative for difficulty urinating.  Musculoskeletal: Positive for arthralgias.  Skin: Negative for rash.  Neurological: Negative for tremors, syncope and headaches.  Hematological: Does not bruise/bleed easily.       Objective:   Physical Exam amb wf nad with prominent pseudowheeze  Wt Readings from Last 3 Encounters:  12/22/11 259 lb 9.6 oz (117.754 kg)  11/16/11 257 lb (116.574 kg)  11/09/11 252 lb 0.8 oz (114.329 kg)     HEENT mild turbinate edema.  Oropharynx no thrush or excess pnd or cobblestoning.  No  JVD or cervical adenopathy. Min accessory muscle hypertrophy. Trachea midline, nl thryroid. Chest was min hyperinflated by percussion with diminished breath sounds and mildly increased exp time sonorous but mostly upper airway bilateral pan exp wheeze. Hoover sign positive at mid inspiration. Regular rate and rhythm without murmur gallop or rub or increase P2 or edema.  Abd: no hsm, nl excursion. Ext warm without cyanosis or clubbing.     cxr  10/19/11 Progressing bilateral interstitial infiltrates most compatible with  progressing pneumonia       Assessment & Plan:

## 2011-12-22 NOTE — Assessment & Plan Note (Signed)
Symptoms are markedly disproportionate to objective findings and not clear this is all a lung problem but pt does appear to have difficult airway management issues. DDX of  difficult airways managment all start with A and  include Adherence, Ace Inhibitors, Acid Reflux, Active Sinus Disease, Alpha 1 Antitripsin deficiency, Anxiety masquerading as Airways dz,  ABPA,  allergy(esp in young), Aspiration (esp in elderly), Adverse effects of DPI,  Active smokers, plus two Bs  = Bronchiectasis and Beta blocker use..and one C= CHF  Adherence is always the initial "prime suspect" and is a multilayered concern that requires a "trust but verify" approach in every patient - starting with knowing how to use medications, especially inhalers, correctly, keeping up with refills and understanding the fundamental difference between maintenance and prns vs those medications only taken for a very short course and then stopped and not refilled. The proper method of use, as well as anticipated side effects, of a metered-dose inhaler are discussed and demonstrated to the patient. Improved effectiveness after extensive coaching during this visit to a level of approximately  75% so change to dulera 100 2bid, the least likely to aggravate cough until sort out on return with pfts  Acid reflux > max rx ppi plus hsh2 until return  Active smoking> discussed separately  Ace Inhbitors > discussed separately  Adverse effect of DPI > change to hfa dulera only with prn saba  Use reviewed

## 2011-12-22 NOTE — Assessment & Plan Note (Signed)

## 2011-12-22 NOTE — Assessment & Plan Note (Signed)
D/c acei 12/22/2011 due to psuedowheeze and narcotic dependent cough  ACE inhibitors are problematic in  pts with airway complaints because  even experienced pulmonologists can't always distinguish ace effects from copd/asthma.  By themselves they don't actually cause a problem, much like oxygen can't by itself start a fire, but they certainly serve as a powerful catalyst or enhancer for any "fire"  or inflammatory process in the upper airway, be it caused by an ET  tube or more commonly reflux (especially in the obese or pts with known GERD or who are on biphoshonates).    In the era of ARB near equivalency until we have a better handle on the reversibility of the airway problem, it just makes sense to avoid ACEI  entirely in the short run and then decide later, having established a level of airway control using a reasonable limited regimen, whether to add back ace but even then being very careful to observe the pt for worsening airway control and number of meds used/ needed to control symptoms.   Given the complexity of her care and the difficulty sorting out all of her very non-specific complaints, I would avoid acei indefinitely here.

## 2012-01-08 ENCOUNTER — Encounter (HOSPITAL_COMMUNITY): Payer: Self-pay | Admitting: Pharmacy Technician

## 2012-01-11 ENCOUNTER — Encounter (HOSPITAL_COMMUNITY): Payer: Self-pay

## 2012-01-11 ENCOUNTER — Encounter (HOSPITAL_COMMUNITY)
Admission: RE | Admit: 2012-01-11 | Discharge: 2012-01-11 | Disposition: A | Discharge status: Home / Self Care | Payer: Medicaid Other | Source: Ambulatory Visit | Attending: Obstetrics & Gynecology | Admitting: Obstetrics & Gynecology

## 2012-01-11 ENCOUNTER — Encounter: Payer: Self-pay | Admitting: Family Medicine

## 2012-01-11 ENCOUNTER — Ambulatory Visit (INDEPENDENT_AMBULATORY_CARE_PROVIDER_SITE_OTHER): Payer: Medicaid Other | Admitting: Family Medicine

## 2012-01-11 VITALS — BP 114/66 | HR 100 | Resp 15 | Ht 64.0 in | Wt 251.1 lb

## 2012-01-11 DIAGNOSIS — F17200 Nicotine dependence, unspecified, uncomplicated: Secondary | ICD-10-CM

## 2012-01-11 DIAGNOSIS — G5603 Carpal tunnel syndrome, bilateral upper limbs: Secondary | ICD-10-CM | POA: Insufficient documentation

## 2012-01-11 DIAGNOSIS — G5600 Carpal tunnel syndrome, unspecified upper limb: Secondary | ICD-10-CM

## 2012-01-11 DIAGNOSIS — M549 Dorsalgia, unspecified: Secondary | ICD-10-CM

## 2012-01-11 DIAGNOSIS — I10 Essential (primary) hypertension: Secondary | ICD-10-CM

## 2012-01-11 DIAGNOSIS — Z720 Tobacco use: Secondary | ICD-10-CM

## 2012-01-11 DIAGNOSIS — Z23 Encounter for immunization: Secondary | ICD-10-CM

## 2012-01-11 DIAGNOSIS — J449 Chronic obstructive pulmonary disease, unspecified: Secondary | ICD-10-CM

## 2012-01-11 HISTORY — DX: Gastro-esophageal reflux disease without esophagitis: K21.9

## 2012-01-11 HISTORY — DX: Anemia, unspecified: D64.9

## 2012-01-11 LAB — COMPREHENSIVE METABOLIC PANEL
ALT: 16 U/L (ref 0–35)
AST: 12 U/L (ref 0–37)
Albumin: 3.6 g/dL (ref 3.5–5.2)
Alkaline Phosphatase: 44 U/L (ref 39–117)
BUN: 9 mg/dL (ref 6–23)
CO2: 25 mEq/L (ref 19–32)
Calcium: 9.4 mg/dL (ref 8.4–10.5)
Chloride: 101 mEq/L (ref 96–112)
Creatinine, Ser: 0.67 mg/dL (ref 0.50–1.10)
GFR calc Af Amer: >90 mL/min (ref >90)
GFR calc non Af Amer: >90 mL/min (ref >90)
Glucose, Bld: 132 mg/dL — ABNORMAL HIGH (ref 70–99)
Potassium: 3.7 mEq/L (ref 3.5–5.1)
Sodium: 135 mEq/L (ref 135–145)
Total Bilirubin: 0.3 mg/dL (ref 0.3–1.2)
Total Protein: 6.6 g/dL (ref 6.0–8.3)

## 2012-01-11 LAB — CBC WITH DIFFERENTIAL/PLATELET
Basophils Absolute: 0 10*3/uL (ref 0.0–0.1)
Basophils Relative: 0 % (ref 0–1)
Eosinophils Absolute: 0.1 10*3/uL (ref 0.0–0.7)
Eosinophils Relative: 1 % (ref 0–5)
HCT: 34.9 % — ABNORMAL LOW (ref 36.0–46.0)
Hemoglobin: 10.6 g/dL — ABNORMAL LOW (ref 12.0–15.0)
Lymphocytes Relative: 27 % (ref 12–46)
Lymphs Abs: 3.4 10*3/uL (ref 0.7–4.0)
MCH: 22.2 pg — ABNORMAL LOW (ref 26.0–34.0)
MCHC: 30.4 g/dL (ref 30.0–36.0)
MCV: 73 fL — ABNORMAL LOW (ref 78.0–100.0)
Monocytes Absolute: 0.8 10*3/uL (ref 0.1–1.0)
Monocytes Relative: 6 % (ref 3–12)
Neutro Abs: 8.2 10*3/uL — ABNORMAL HIGH (ref 1.7–7.7)
Neutrophils Relative %: 65 % (ref 43–77)
Platelets: 334 10*3/uL (ref 150–400)
RBC: 4.78 MIL/uL (ref 3.87–5.11)
RDW: 18.6 % — ABNORMAL HIGH (ref 11.5–15.5)
WBC: 12.5 10*3/uL — ABNORMAL HIGH (ref 4.0–10.5)

## 2012-01-11 LAB — URINALYSIS, ROUTINE W REFLEX MICROSCOPIC
Bilirubin Urine: NEGATIVE
Glucose, UA: NEGATIVE mg/dL
Ketones, ur: NEGATIVE mg/dL
Leukocytes, UA: NEGATIVE
Nitrite: NEGATIVE
Protein, ur: NEGATIVE mg/dL
Specific Gravity, Urine: 1.02 (ref 1.005–1.030)
Urobilinogen, UA: 0.2 mg/dL (ref 0.0–1.0)
pH: 6 (ref 5.0–8.0)

## 2012-01-11 LAB — SURGICAL PCR SCREEN
MRSA, PCR: NEGATIVE
Staphylococcus aureus: NEGATIVE

## 2012-01-11 LAB — HCG, SERUM, QUALITATIVE: Preg, Serum: NEGATIVE

## 2012-01-11 LAB — URINE MICROSCOPIC-ADD ON

## 2012-01-11 MED ORDER — OLMESARTAN MEDOXOMIL 20 MG PO TABS: 20.0000 mg | ORAL_TABLET | Freq: Every day | ORAL | Status: DC

## 2012-01-11 MED ORDER — OXYCODONE-ACETAMINOPHEN 5-325 MG PO TABS: 1.0000 | ORAL_TABLET | Freq: Three times a day (TID) | ORAL | Status: DC | PRN

## 2012-01-11 MED ORDER — FLUTICASONE PROPIONATE 50 MCG/ACT NA SUSP: 2.0000 | Freq: Every day | NASAL | Status: DC

## 2012-01-11 NOTE — Assessment & Plan Note (Signed)
Chronic pain, meds refilled  

## 2012-01-11 NOTE — Assessment & Plan Note (Signed)
Doing well on benicar

## 2012-01-11 NOTE — Assessment & Plan Note (Signed)
Continues to loose weight. Continue exercise program

## 2012-01-11 NOTE — Patient Instructions (Addendum)
I will send braces to apothecary  Pain medication given today Flu shot given  No change to medications F/U 8 weeks ( Before Nov 30th)

## 2012-01-11 NOTE — Assessment & Plan Note (Signed)
Concern for bilateral carpal tunnel, trial of wrist splints if no improvement send for nerve conduction studies

## 2012-01-11 NOTE — Assessment & Plan Note (Signed)
Currently stable, noted changes

## 2012-01-11 NOTE — Patient Instructions (Signed)
20 [name]  01/11/2012   Your procedure is scheduled on:  01/17/12  Report to [facility] at 07:00 AM.  Call this number if you have problems the morning of surgery: [phone]   Remember:   Do not eat or drink:After Midnight.  Take these medicines the morning of surgery with A SIP OF WATER: Benicar, Ranitidine, Omeprazole, Citalopram and Oxycodone (if needed). Also, use your Albuterol and Dulera and bring them with you to the hospital.    Do not wear jewelry, make-up or nail polish.  Do not wear lotions, powders, or perfumes.   Do not shave 48 hours prior to surgery. Men may shave face and neck.  Do not bring valuables to the hospital.  Contacts, dentures or bridgework may not be worn into surgery.  Leave suitcase in the car. After surgery it may be brought to your room.  For patients admitted to the hospital, checkout time is 11:00 AM the day of discharge.   Patients discharged the day of surgery will not be allowed to drive home.  Special Instructions: CHG Shower Shower 2 days before surgery and 1 day before surgery with Hibiclens.   Please read over the following fact sheets that you were given: Pain Booklet, MRSA Information, Surgical Site Infection Prevention, Anesthesia Post-op Instructions and Care and Recovery After Surgery

## 2012-01-11 NOTE — Patient Instructions (Signed)
20 Keidra A [name]  01/11/2012   Your procedure is scheduled on:  01/17/12  Report to [facility] at 07:00 AM.  Call this number if you have problems the morning of surgery: [phone]   Remember:   Do not eat food:After Midnight.  May have clear liquids:until Midnight .  Clear liquids include soda, tea, black coffee, apple or grape juice, broth.  Take these medicines the morning of surgery with A SIP OF WATER: Citalopram, Omeprazole, Ranitidine and Oxycodone if needed. Also, use your Albuterol, Dulera, and Fluticasone and bring your inhalers with you to the hospital.   Do not wear jewelry, make-up or nail polish.  Do not wear lotions, powders, or perfumes.   Do not shave 48 hours prior to surgery. Men may shave face and neck.  Do not bring valuables to the hospital.  Contacts, dentures or bridgework may not be worn into surgery.  Leave suitcase in the car. After surgery it may be brought to your room.  For patients admitted to the hospital, checkout time is 11:00 AM the day of discharge.   Patients discharged the day of surgery will not be allowed to drive home.  Special Instructions: CHG Shower Shower 2 days before surgery and 1 day before surgery with Hibiclens.   Please read over the following fact sheets that you were given: Pain Booklet, MRSA Information, Surgical Site Infection Prevention, Anesthesia Post-op Instructions and Care and Recovery After Surgery    Dilation and Curettage or Vacuum Curettage Dilation and curettage (D&C) and vacuum curettage are minor procedures. A D&C involves stretching (dilation) the cervix and scraping (curettage) the inside lining of the womb (uterus). During a D&C, tissue is gently scraped from the inside lining of the uterus. During a vacuum curettage, the lining and tissue in the uterus are removed with the use of gentle suction. Curettage may be performed for diagnostic or therapeutic purposes. As a diagnostic procedure, curettage is performed for  the purpose of examining tissues from the uterus. Tissue examination may help determine causes or treatment options for symptoms. A diagnostic curettage may be performed for the following symptoms:  Irregular bleeding in the uterus.   Bleeding with the development of clots.   Spotting between menstrual periods.   Prolonged menstrual periods.   Bleeding after menopause.   No menstrual period (amenorrhea).   A change in size and shape of the uterus.  A therapeutic curettage is performed to remove tissue, blood, or a contraceptive device. Therapeutic curettage may be performed for the following conditions:   Removal of an IUD (intrauterine device).   Removal of retained placenta after giving birth. Retained placenta can cause bleeding severe enough to require transfusions or an infection.   Abortion.   Miscarriage.   Removal of polyps inside the uterus.   Removal of uncommon types of fibroids (noncancerous lumps).  LET YOUR CAREGIVER KNOW ABOUT:   Allergies to food or medicine.   Medicines taken, including vitamins, herbs, eyedrops, over-the-counter medicines, and creams.   Use of steroids (by mouth or creams).   Previous problems with anesthetics or numbing medicines.   History of bleeding problems or blood clots.   Previous surgery.   Other health problems, including diabetes and kidney problems.   Possibility of pregnancy, if this applies.  RISKS AND COMPLICATIONS   Excessive bleeding.   Infection of the uterus.   Damage to the cervix.   Development of scar tissue (adhesions) inside the uterus, later causing abnormal amounts of menstrual bleeding.  Complications from the general anesthetic, if a general anesthetic is used.   Putting a hole (perforation) in the uterus. This is rare.  BEFORE THE PROCEDURE   Eat and drink before the procedure only as directed by your caregiver.   Arrange for someone to take you home.  PROCEDURE   This procedure may be  done in a hospital, outpatient clinic, or caregiver's office.   You may be given a general anesthetic or a local anesthetic in and around the cervix.   You will lie on your back with your legs in stirrups.   There are two ways in which your cervix can be softened and dilated. These include:   Taking a medicine.   Having thin rods (laminaria) inserted into your cervix.   A curved tool (curette) will scrape cells from the inside lining of the uterus and will then be removed.  This procedure usually takes about 15 to 30 minutes. AFTER THE PROCEDURE   You will rest in the recovery area until you are stable and are ready to go home.   You will need to have someone take you home.   You may feel sick to your stomach (nauseous) or throw up (vomit) if you had general anesthesia.   You may have a sore throat if a tube was placed in your throat during general anesthesia.   You may have light cramping and bleeding for 2 days to 2 weeks after the procedure.   Your uterus needs to make a new lining after the procedure. This may make your next period late.  Document Released: 04/10/2005 Document Revised: 03/30/2011 Document Reviewed: 11/06/2008 [facility] Patient Information 2012 [place], [place].   PATIENT INSTRUCTIONS POST-ANESTHESIA  IMMEDIATELY FOLLOWING SURGERY:  Do not drive or operate machinery for the first twenty four hours after surgery.  Do not make any important decisions for twenty four hours after surgery or while taking narcotic pain medications or sedatives.  If you develop intractable nausea and vomiting or a severe headache please notify your doctor immediately.  FOLLOW-UP:  Please make an appointment with your surgeon as instructed. You do not need to follow up with anesthesia unless specifically instructed to do so.  WOUND CARE INSTRUCTIONS (if applicable):  Keep a dry clean dressing on the anesthesia/puncture wound site if there is drainage.  Once the wound has quit draining  you may leave it open to air.  Generally you should leave the bandage intact for twenty four hours unless there is drainage.  If the epidural site drains for more than 36-48 hours please call the anesthesia department.  QUESTIONS?:  Please feel free to call your physician or the hospital operator if you have any questions, and they will be happy to assist you.

## 2012-01-11 NOTE — Assessment & Plan Note (Signed)
Continue to reiterate, no change in tobacco use 15 cig /day

## 2012-01-11 NOTE — Progress Notes (Signed)
  Subjective:    Patient ID: [name], female    DOB: 1965-05-07, 46 y.o.   MRN: [id number]  HPI Pt here to f/u chronic medical problems Pulmonary note reviewed, doing well on duler Wearing oxygen mostly at bedtime Complains or right >L finger numbness and hand pain that occasionally shoots up arm, worse at night when she sleeps on wrist or after cleaning.  She stepped wrong this week and felt a pop in her right groin GYN- plans to have ablation next week Review of Systems  GEN- denies fatigue, fever, weight loss,weakness, recent illness HEENT- denies eye drainage, change in vision, nasal discharge, CVS- denies chest pain, palpitations RESP- denies SOB, +cough, +wheeze ABD- denies N/V, change in stools, abd pain GU- denies dysuria, hematuria, dribbling, incontinence MSK- d+ joint pain, muscle aches, injury Neuro- denies headache, dizziness, syncope, seizure activity      Objective:   Physical Exam GEN- NAD, alert and oriented x3, obese  HEENT- PERRL, EOMI, non injected sclera, pink conjunctiva, MMM, oropharynx clear Neck- Supple, no LAD CVS- RRR, no murmur RESP-scattered bilateral wheeze, normal WOB, no rhonchi , ABD-NABS,soft,NT,ND EXT- No edema Pulses- Radial, DP- 2+ Neuro- normal grasp bilat, strength equal upper ext, DTR symmetric UE, +phalens, +tinnels      Assessment & Plan:

## 2012-01-17 ENCOUNTER — Ambulatory Visit (HOSPITAL_COMMUNITY)
Admission: RE | Admit: 2012-01-17 | Discharge: 2012-01-17 | Disposition: A | Discharge status: Home / Self Care | Payer: Medicaid Other | Source: Ambulatory Visit | Attending: Obstetrics & Gynecology | Admitting: Obstetrics & Gynecology

## 2012-01-17 ENCOUNTER — Encounter (HOSPITAL_COMMUNITY): Payer: Self-pay | Admitting: Anesthesiology

## 2012-01-17 ENCOUNTER — Ambulatory Visit (HOSPITAL_COMMUNITY): Payer: Medicaid Other | Admitting: Anesthesiology

## 2012-01-17 ENCOUNTER — Encounter (HOSPITAL_COMMUNITY): Payer: Self-pay

## 2012-01-17 ENCOUNTER — Encounter (HOSPITAL_COMMUNITY)
Admission: RE | Disposition: A | Discharge status: Home / Self Care | Payer: Self-pay | Source: Ambulatory Visit | Attending: Obstetrics & Gynecology

## 2012-01-17 DIAGNOSIS — J4489 Other specified chronic obstructive pulmonary disease: Secondary | ICD-10-CM | POA: Insufficient documentation

## 2012-01-17 DIAGNOSIS — Z79899 Other long term (current) drug therapy: Secondary | ICD-10-CM | POA: Insufficient documentation

## 2012-01-17 DIAGNOSIS — Z01812 Encounter for preprocedural laboratory examination: Secondary | ICD-10-CM | POA: Insufficient documentation

## 2012-01-17 DIAGNOSIS — N946 Dysmenorrhea, unspecified: Secondary | ICD-10-CM | POA: Insufficient documentation

## 2012-01-17 DIAGNOSIS — J449 Chronic obstructive pulmonary disease, unspecified: Secondary | ICD-10-CM | POA: Insufficient documentation

## 2012-01-17 DIAGNOSIS — D649 Anemia, unspecified: Secondary | ICD-10-CM | POA: Insufficient documentation

## 2012-01-17 DIAGNOSIS — N920 Excessive and frequent menstruation with regular cycle: Secondary | ICD-10-CM | POA: Insufficient documentation

## 2012-01-17 DIAGNOSIS — E119 Type 2 diabetes mellitus without complications: Secondary | ICD-10-CM | POA: Insufficient documentation

## 2012-01-17 DIAGNOSIS — I10 Essential (primary) hypertension: Secondary | ICD-10-CM | POA: Insufficient documentation

## 2012-01-17 DIAGNOSIS — N921 Excessive and frequent menstruation with irregular cycle: Secondary | ICD-10-CM

## 2012-01-17 HISTORY — PX: HYSTEROSCOPY WITH THERMACHOICE: SHX5396

## 2012-01-17 LAB — GLUCOSE, CAPILLARY
Glucose-Capillary: 126 mg/dL — ABNORMAL HIGH (ref 70–99)
Glucose-Capillary: 111 mg/dL — ABNORMAL HIGH (ref 70–99)

## 2012-01-17 SURGERY — HYSTEROSCOPY WITH THERMACHOICE
Anesthesia: General | Site: Vagina | Wound class: Clean Contaminated

## 2012-01-17 MED ORDER — PROPOFOL 10 MG/ML IV EMUL
INTRAVENOUS | Status: DC | PRN
  Administered 2012-01-17: 150 mg via INTRAVENOUS

## 2012-01-17 MED ORDER — SODIUM CHLORIDE 0.9 % IR SOLN
Status: DC | PRN
  Administered 2012-01-17: 3000 mL

## 2012-01-17 MED ORDER — CEFAZOLIN SODIUM-DEXTROSE 2-3 GM-% IV SOLR
INTRAVENOUS | Status: DC | PRN
  Administered 2012-01-17: 2 g via INTRAVENOUS

## 2012-01-17 MED ORDER — DEXTROSE 5 % IV SOLN
INTRAVENOUS | Status: DC | PRN
Start: 2012-01-17 — End: 2012-01-17
  Administered 2012-01-17: 500 mL via INTRAVENOUS

## 2012-01-17 MED ORDER — LIDOCAINE HCL (CARDIAC) 10 MG/ML IV SOLN
INTRAVENOUS | Status: DC | PRN
  Administered 2012-01-17: 50 mg via INTRAVENOUS

## 2012-01-17 MED ORDER — FENTANYL CITRATE 0.05 MG/ML IJ SOLN
INTRAMUSCULAR | Status: AC
  Filled 2012-01-17: qty 2

## 2012-01-17 MED ORDER — MIDAZOLAM HCL 2 MG/2ML IJ SOLN
1.0000 mg | INTRAMUSCULAR | Status: DC | PRN
  Administered 2012-01-17: 2 mg via INTRAVENOUS

## 2012-01-17 MED ORDER — FENTANYL CITRATE 0.05 MG/ML IJ SOLN
25.0000 ug | INTRAMUSCULAR | Status: DC | PRN
  Administered 2012-01-17 (×4): 50 ug via INTRAVENOUS

## 2012-01-17 MED ORDER — CEFAZOLIN SODIUM-DEXTROSE 2-3 GM-% IV SOLR
INTRAVENOUS | Status: AC
  Filled 2012-01-17: qty 50

## 2012-01-17 MED ORDER — CEFAZOLIN SODIUM-DEXTROSE 2-3 GM-% IV SOLR: 2.0000 g | INTRAVENOUS | Status: DC

## 2012-01-17 MED ORDER — KETOROLAC TROMETHAMINE 30 MG/ML IJ SOLN
30.0000 mg | Freq: Once | INTRAMUSCULAR | Status: AC
  Administered 2012-01-17: 30 mg via INTRAVENOUS

## 2012-01-17 MED ORDER — LIDOCAINE HCL (PF) 1 % IJ SOLN
INTRAMUSCULAR | Status: AC
  Filled 2012-01-17: qty 5

## 2012-01-17 MED ORDER — SUCCINYLCHOLINE CHLORIDE 20 MG/ML IJ SOLN
INTRAMUSCULAR | Status: AC
  Filled 2012-01-17: qty 1

## 2012-01-17 MED ORDER — ARTIFICIAL TEARS OP OINT
TOPICAL_OINTMENT | OPHTHALMIC | Status: AC
  Filled 2012-01-17: qty 3.5

## 2012-01-17 MED ORDER — KETOROLAC TROMETHAMINE 30 MG/ML IJ SOLN
INTRAMUSCULAR | Status: AC
  Filled 2012-01-17: qty 1

## 2012-01-17 MED ORDER — MIDAZOLAM HCL 5 MG/5ML IJ SOLN
INTRAMUSCULAR | Status: DC | PRN
  Administered 2012-01-17: 2 mg via INTRAVENOUS

## 2012-01-17 MED ORDER — LACTATED RINGERS IV SOLN
INTRAVENOUS | Status: DC | PRN
  Administered 2012-01-17: 08:00:00 via INTRAVENOUS

## 2012-01-17 MED ORDER — FENTANYL CITRATE 0.05 MG/ML IJ SOLN
INTRAMUSCULAR | Status: DC | PRN
  Administered 2012-01-17 (×2): 50 ug via INTRAVENOUS
  Administered 2012-01-17: 100 ug via INTRAVENOUS

## 2012-01-17 MED ORDER — SODIUM CHLORIDE 0.9 % IR SOLN
Status: DC | PRN
  Administered 2012-01-17: 1000 mL

## 2012-01-17 MED ORDER — MIDAZOLAM HCL 2 MG/2ML IJ SOLN
INTRAMUSCULAR | Status: AC
  Filled 2012-01-17: qty 2

## 2012-01-17 MED ORDER — ONDANSETRON HCL 8 MG PO TABS: 8.0000 mg | ORAL_TABLET | Freq: Three times a day (TID) | ORAL | Status: DC | PRN

## 2012-01-17 MED ORDER — PROPOFOL 10 MG/ML IV EMUL
INTRAVENOUS | Status: AC
  Filled 2012-01-17: qty 20

## 2012-01-17 MED ORDER — KETOROLAC TROMETHAMINE 10 MG PO TABS: 10.0000 mg | ORAL_TABLET | Freq: Three times a day (TID) | ORAL | Status: DC | PRN

## 2012-01-17 MED ORDER — ONDANSETRON HCL 4 MG/2ML IJ SOLN: 4.0000 mg | Freq: Once | INTRAMUSCULAR | Status: DC | PRN

## 2012-01-17 MED ORDER — LACTATED RINGERS IV SOLN
INTRAVENOUS | Status: DC
  Administered 2012-01-17: 08:00:00 via INTRAVENOUS

## 2012-01-17 SURGICAL SUPPLY — 26 items
BAG DECANTER FOR FLEXI CONT (MISCELLANEOUS) ×3 IMPLANT
BAG HAMPER (MISCELLANEOUS) ×3 IMPLANT
CATH THERMACHOICE III (CATHETERS) ×3 IMPLANT
CLOTH BEACON ORANGE TIMEOUT ST (SAFETY) ×3 IMPLANT
COVER LIGHT HANDLE STERIS (MISCELLANEOUS) ×6 IMPLANT
FORMALIN 10 PREFIL 120ML (MISCELLANEOUS) ×3 IMPLANT
GAUZE SPONGE 4X4 16PLY XRAY LF (GAUZE/BANDAGES/DRESSINGS) ×3 IMPLANT
GLOVE BIOGEL PI IND STRL 8 (GLOVE) ×2 IMPLANT
GLOVE BIOGEL PI INDICATOR 8 (GLOVE) ×1
GLOVE ECLIPSE 8.0 STRL XLNG CF (GLOVE) ×3 IMPLANT
GOWN STRL REIN XL XLG (GOWN DISPOSABLE) ×9 IMPLANT
INST SET HYSTEROSCOPY (KITS) ×3 IMPLANT
IV D5W 500ML (IV SOLUTION) ×3 IMPLANT
IV NS IRRIG 3000ML ARTHROMATIC (IV SOLUTION) ×3 IMPLANT
KIT ROOM TURNOVER APOR (KITS) ×3 IMPLANT
MANIFOLD NEPTUNE II (INSTRUMENTS) ×3 IMPLANT
MARKER SKIN DUAL TIP RULER LAB (MISCELLANEOUS) ×3 IMPLANT
NS IRRIG 1000ML POUR BTL (IV SOLUTION) ×3 IMPLANT
PACK BASIC III (CUSTOM PROCEDURE TRAY) ×1
PACK SRG BSC III STRL LF ECLPS (CUSTOM PROCEDURE TRAY) ×2 IMPLANT
PAD ARMBOARD 7.5X6 YLW CONV (MISCELLANEOUS) ×3 IMPLANT
PAD TELFA 3X4 1S STER (GAUZE/BANDAGES/DRESSINGS) ×3 IMPLANT
SET BASIN LINEN APH (SET/KITS/TRAYS/PACK) ×3 IMPLANT
SET IRRIG Y TYPE TUR BLADDER L (SET/KITS/TRAYS/PACK) ×3 IMPLANT
SHEET LAVH (DRAPES) ×3 IMPLANT
YANKAUER SUCT BULB TIP 10FT TU (MISCELLANEOUS) ×3 IMPLANT

## 2012-01-17 NOTE — Anesthesia Preprocedure Evaluation (Signed)
Anesthesia Evaluation  Patient identified by MRN, date of birth, ID band Patient awake    Reviewed: Allergy & Precautions, H&P , NPO status , Patient's Chart, lab work & pertinent test results  History of Anesthesia Complications Negative for: history of anesthetic complications  Airway Mallampati: III TM Distance: >3 FB Neck ROM: Full    Dental  (+) Teeth Intact   Pulmonary shortness of breath, asthma , COPD COPD inhaler, Current Smoker,  breath sounds clear to auscultation        Cardiovascular hypertension, Pt. on medications Rhythm:Regular Rate:Normal     Neuro/Psych PSYCHIATRIC DISORDERS Anxiety Depression    GI/Hepatic PUD, GERD-  Medicated and Controlled,  Endo/Other  diabetes, Well Controlled, Type 2, Oral Hypoglycemic AgentsMorbid obesity  Renal/GU      Musculoskeletal   Abdominal   Peds  Hematology   Anesthesia Other Findings   Reproductive/Obstetrics                           Anesthesia Physical Anesthesia Plan  ASA: III  Anesthesia Plan: General   Post-op Pain Management:    Induction: Intravenous, Rapid sequence and Cricoid pressure planned  Airway Management Planned: Oral ETT  Additional Equipment:   Intra-op Plan:   Post-operative Plan: Extubation in OR  Informed Consent: I have reviewed the patients History and Physical, chart, labs and discussed the procedure including the risks, benefits and alternatives for the proposed anesthesia with the patient or authorized representative who has indicated his/her understanding and acceptance.     Plan Discussed with:   Anesthesia Plan Comments:         Anesthesia Quick Evaluation

## 2012-01-17 NOTE — Anesthesia Postprocedure Evaluation (Signed)
  Anesthesia Post-op Note  Patient: [name]  Procedure(s) Performed: Procedure(s) (LRB) with comments: HYSTEROSCOPY WITH THERMACHOICE (N/A) - total therapy time: 9:13sec  D5W  18 ml in, D5W   18ml out, temperture 87degrees celcious  Patient Location: PACU  Anesthesia Type: General  Level of Consciousness: awake, alert , oriented and patient cooperative  Airway and Oxygen Therapy: Patient Spontanous Breathing and Patient connected to face mask oxygen  Post-op Pain: none  Post-op Assessment: Post-op Vital signs reviewed, Patient's Cardiovascular Status Stable, Respiratory Function Stable and Patent Airway  Post-op Vital Signs: Reviewed and stable  Complications: No apparent anesthesia complications

## 2012-01-17 NOTE — H&P (Signed)
[name] Elijah Birk is an 46 y.o. female status post bilateral tubal ligation with increasingly heavy and painful menstrual periods.  Sonogram normal and positive response to megestrol.  Patient considered options and opts to proceed with and endometrial ablation.    Past Medical History  Diagnosis Date  . Diabetes mellitus   . COPD (chronic obstructive pulmonary disease)   . HTN (hypertension)   . Low back pain   . Tachycardia     never had test done since no insurance  . Depression   . Asthma   . Shortness of breath   . Anxiety   . Gastric erosions     EGD 08/2010.  . Internal hemorrhoids     Colonoscopy 5/12.  [name] Heavy menses   . Chronic respiratory failure with hypoxia     On 2-3 L of oxygen at home  . GERD (gastroesophageal reflux disease)   . Anemia     Past Surgical History  Procedure Date  . Cholecystectomy 1990  . Cesarean section     twice  . Kidney surgery     as child for blockages  . Tubal ligation   . Tonsillectomy   . Wrist surgery 1995    Lt wrist  . Tympanostomy tube placement     Family History  Problem Relation Age of Onset  . Heart attack Father 54    deceased, etoh use  . Heart disease Father   . Heart attack Mother 82    deceased  . Diabetes Mother   . Breast cancer Mother   . Heart failure Mother     oxygen dependence, nonsmoker  . Heart disease Mother   . Depression Mother   . Cancer Mother   . Colon cancer Neg Hx   . Liver disease Maternal Aunt 40    died while on liver transplant list  . Heart attack Maternal Grandmother     premature CAD  . Ulcers Sister   . Hypertension Sister     Social History:  reports that she has been smoking Cigarettes.  She has a 30 pack-year smoking history. She has never used smokeless tobacco. She reports that she drinks alcohol. She reports that she does not use illicit drugs.  Allergies:  Allergies  Allergen Reactions  . Codeine Itching and Nausea Only  . Wellbutrin (Bupropion Hcl) Hives     Prescriptions prior to admission  Medication Sig Dispense Refill  . albuterol (PROVENTIL HFA) 108 (90 BASE) MCG/ACT inhaler Inhale 2 puffs into the lungs every 4 (four) hours as needed for wheezing or shortness of breath. Wheezing/shortnes of breath  1 Inhaler  3  . albuterol (PROVENTIL) (5 MG/ML) 0.5% nebulizer solution Take 2.5 mg by nebulization 4 (four) times daily as needed.      . citalopram (CELEXA) 20 MG tablet Take 1 tablet (20 mg total) by mouth daily.  30 tablet  3  . ferrous sulfate (IRON SUPPLEMENT) 325 (65 FE) MG tablet Take 325 mg by mouth daily with breakfast.      . fluticasone (FLONASE) 50 MCG/ACT nasal spray Place 2 sprays into the nose daily.  16 g  3  . gabapentin (NEURONTIN) 300 MG capsule Take 1 capsule (300 mg total) by mouth 3 (three) times daily.  90 capsule  2  . ibuprofen (ADVIL,MOTRIN) 200 MG tablet Take 200-400 mg by mouth daily as needed. For pain       . megestrol (MEGACE) 40 MG tablet Take 40 mg by mouth daily.      [name]  metFORMIN (GLUCOPHAGE) 500 MG tablet TAKE ONE TABLET BY MOUTH TWICE DAILY WITH MEALS  60 tablet  3  . mometasone-formoterol (DULERA) 100-5 MCG/ACT AERO Inhale 2 puffs into the lungs 2 (two) times daily.  1 Inhaler  0  . Multiple Vitamin (MULTIVITAMIN) capsule Take 1 capsule by mouth daily.      [name] olmesartan (BENICAR) 20 MG tablet Take 1 tablet (20 mg total) by mouth daily.  30 tablet  3  . omeprazole (PRILOSEC) 20 MG capsule TAKE ONE CAPSULE BY MOUTH EVERY DAY  30 capsule  5  . oxyCODONE-acetaminophen (PERCOCET/ROXICET) 5-325 MG per tablet Take 1 tablet by mouth 3 (three) times daily as needed for pain.  90 tablet  0  . Pseudoephedrine-DM-GG (ROBITUSSIN CF PO) Take 5-10 mLs by mouth as needed. For cough      . ranitidine (ZANTAC) 150 MG tablet Take 1 tablet (150 mg total) by mouth 2 (two) times daily.  30 tablet  11    ROS  Review of Systems  Constitutional: Negative for fever, chills, weight loss, malaise/fatigue and diaphoresis.  HENT:  Negative for hearing loss, ear pain, nosebleeds, congestion, sore throat, neck pain, tinnitus and ear discharge.   Eyes: Negative for blurred vision, double vision, photophobia, pain, discharge and redness.  Respiratory: Negative for cough, hemoptysis, sputum production, shortness of breath, wheezing and stridor.   Cardiovascular: Negative for chest pain, palpitations, orthopnea, claudication, leg swelling and PND.  Gastrointestinal:Negative for abdominal pain. Negative for heartburn, nausea, vomiting, diarrhea, constipation, blood in stool and melena.  Genitourinary: Negative for dysuria, urgency, frequency, hematuria and flank pain.  Musculoskeletal: Negative for myalgias, back pain, joint pain and falls.  Skin: Negative for itching and rash.  Neurological: Negative for dizziness, tingling, tremors, sensory change, speech change, focal weakness, seizures, loss of consciousness, weakness and headaches.  Endo/Heme/Allergies: Negative for environmental allergies and polydipsia. Does not bruise/bleed easily.  Psychiatric/Behavioral: Negative for depression, suicidal ideas, hallucinations, memory loss and substance abuse. The patient is not nervous/anxious and does not have insomnia.      Temperature 98.2 F (36.8 C), temperature source Oral. Physical Exam Physical Exam  Vitals reviewed. Constitutional: She is oriented to person, place, and time. She appears well-developed and well-nourished.  HENT:  Head: Normocephalic and atraumatic.  Right Ear: External ear normal.  Left Ear: External ear normal.  Nose: Nose normal.  Mouth/Throat: Oropharynx is clear and moist.  Eyes: Conjunctivae and EOM are normal. Pupils are equal, round, and reactive to light. Right eye exhibits no discharge. Left eye exhibits no discharge. No scleral icterus.  Neck: Normal range of motion. Neck supple. No tracheal deviation present. No thyromegaly present.  Cardiovascular: Normal rate, regular rhythm, normal heart  sounds and intact distal pulses.  Exam reveals no gallop and no friction rub.   No murmur heard. Respiratory: Effort normal and breath sounds normal. No respiratory distress. She has no wheezes. She has no rales. She exhibits no tenderness.  GI: Soft. Bowel sounds are normal. She exhibits no distension and no mass. There is tenderness. There is no rebound and no guarding.  Genitourinary:       Vulva is normal without lesions Vagina is pink moist without discharge Cervix normal in appearance and pap is normal Uterus is normal size shape and contour Adnexa is negative with normal sized ovaries by sonogram  Musculoskeletal: Normal range of motion. She exhibits no edema and no tenderness.  Neurological: She is alert and oriented to person, place, and time. She has normal reflexes.  She displays normal reflexes. No cranial nerve deficit. She exhibits normal muscle tone. Coordination normal.  Skin: Skin is warm and dry. No rash noted. No erythema. No pallor.  Psychiatric: She has a normal mood and affect. Her behavior is normal. Judgment and thought content normal.   Recent Results (from the past 336 hour(s))  URINALYSIS, ROUTINE W REFLEX MICROSCOPIC   Collection Time   01/11/12 11:13 AM      Component Value Range   Color, Urine YELLOW  YELLOW   APPearance CLEAR  CLEAR   Specific Gravity, Urine 1.020  1.005 - 1.030   pH 6.0  5.0 - 8.0   Glucose, UA NEGATIVE  NEGATIVE mg/dL   Hgb urine dipstick TRACE (*) NEGATIVE   Bilirubin Urine NEGATIVE  NEGATIVE   Ketones, ur NEGATIVE  NEGATIVE mg/dL   Protein, ur NEGATIVE  NEGATIVE mg/dL   Urobilinogen, UA 0.2  0.0 - 1.0 mg/dL   Nitrite NEGATIVE  NEGATIVE   Leukocytes, UA NEGATIVE  NEGATIVE  URINE MICROSCOPIC-ADD ON   Collection Time   01/11/12 11:13 AM      Component Value Range   WBC, UA 0-2  <3 WBC/hpf   RBC / HPF 0-2  <3 RBC/hpf   Bacteria, UA RARE  RARE  SURGICAL PCR SCREEN   Collection Time   01/11/12 11:14 AM      Component Value Range    MRSA, PCR NEGATIVE  NEGATIVE   Staphylococcus aureus NEGATIVE  NEGATIVE  HCG, SERUM, QUALITATIVE   Collection Time   01/11/12 11:20 AM      Component Value Range   Preg, Serum NEGATIVE  NEGATIVE  CBC WITH DIFFERENTIAL   Collection Time   01/11/12 11:20 AM      Component Value Range   WBC 12.5 (*) 4.0 - 10.5 K/uL   RBC 4.78  3.87 - 5.11 MIL/uL   Hemoglobin 10.6 (*) 12.0 - 15.0 g/dL   HCT 16.1 (*) 09.6 - 04.5 %   MCV 73.0 (*) 78.0 - 100.0 fL   MCH 22.2 (*) 26.0 - 34.0 pg   MCHC 30.4  30.0 - 36.0 g/dL   RDW 40.9 (*) 81.1 - 91.4 %   Platelets 334  150 - 400 K/uL   Neutrophils Relative 65  43 - 77 %   Neutro Abs 8.2 (*) 1.7 - 7.7 K/uL   Lymphocytes Relative 27  12 - 46 %   Lymphs Abs 3.4  0.7 - 4.0 K/uL   Monocytes Relative 6  3 - 12 %   Monocytes Absolute 0.8  0.1 - 1.0 K/uL   Eosinophils Relative 1  0 - 5 %   Eosinophils Absolute 0.1  0.0 - 0.7 K/uL   Basophils Relative 0  0 - 1 %   Basophils Absolute 0.0  0.0 - 0.1 K/uL  COMPREHENSIVE METABOLIC PANEL   Collection Time   01/11/12 11:20 AM      Component Value Range   Sodium 135  135 - 145 mEq/L   Potassium 3.7  3.5 - 5.1 mEq/L   Chloride 101  96 - 112 mEq/L   CO2 25  19 - 32 mEq/L   Glucose, Bld 132 (*) 70 - 99 mg/dL   BUN 9  6 - 23 mg/dL   Creatinine, Ser 7.82  0.50 - 1.10 mg/dL   Calcium 9.4  8.4 - 95.6 mg/dL   Total Protein 6.6  6.0 - 8.3 g/dL   Albumin 3.6  3.5 - 5.2 g/dL   AST  12  0 - 37 U/L   ALT 16  0 - 35 U/L   Alkaline Phosphatase 44  39 - 117 U/L   Total Bilirubin 0.3  0.3 - 1.2 mg/dL   GFR calc non Af Amer >90  >90 mL/min   GFR calc Af Amer >90  >90 mL/min         Assessment/Plan: 1.  Menometrorrhagia 2.  Dysmenorrhea 3.  Anemia  Proceed with endometrial ablation, she understands the risks of failure of the procedure.  Arien Morine H 01/17/2012, 8:31 AM

## 2012-01-17 NOTE — Op Note (Signed)
Preoperative diagnosis: Menometrorrhagia                                        Dysmenorrhea                                         Anemia  Postoperative diagnoses: Same as above   Procedure: Hysteroscopy,  endometrial ablation  Surgeon: Despina Hidden MD  Anesthesia: Laryngeal mask airway  Findings: The endometrium was normal. There were no fibroid or other abnormalities.  Description of operation: The patient was taken to the operating room and placed in the supine position. She underwent general anesthesia using the laryngeal mask airway. She was placed in the dorsal lithotomy position and prepped and draped in the usual sterile fashion. A Graves speculum was placed and the anterior cervical lip was grasped with a single-tooth tenaculum. The cervix was dilated serially to allow passage of the hysteroscope. Diagnostic hysteroscopy was performed and was found to be normal. No curettage was needed because there was no tissue from megestrol suppression. The ThermaChoice 3 endometrial ablation balloon was then used were 18 cc of D5W was required to maintain a pressure of 190-200 mm of mercury throughout the procedure. Toatl therapy time was 9:13.  All of the equipment worked well throughout the procedure. All of the fluid was returned at the end of the procedure. The patient was awakened from anesthesia and taken to the recovery room in good stable condition all counts were correct. She received 2 g of Ancef and 30 mg of Toradol preoperatively. She will be discharged from the recovery room and followed up in the office in 2 weeks.  [name] 01/17/2012 9:25 AM

## 2012-01-17 NOTE — Anesthesia Procedure Notes (Signed)
Procedure Name: Intubation Date/Time: 01/17/2012 8:49 AM Performed by: Carolyne Littles, Caedyn Raygoza L Pre-anesthesia Checklist: Patient identified, Patient being monitored, Timeout performed, Emergency Drugs available and Suction available Patient Re-evaluated:Patient Re-evaluated prior to inductionOxygen Delivery Method: Circle System Utilized Preoxygenation: Pre-oxygenation with 100% oxygen Intubation Type: IV induction, Rapid sequence and Cricoid Pressure applied Grade View: Grade I Tube type: Oral Tube size: 7.0 mm Number of attempts: 1 Airway Equipment and Method: stylet and Video-laryngoscopy Placement Confirmation: ETT inserted through vocal cords under direct vision,  positive ETCO2 and breath sounds checked- equal and bilateral Secured at: 21 cm Tube secured with: Tape Dental Injury: Teeth and Oropharynx as per pre-operative assessment

## 2012-01-17 NOTE — Preoperative (Signed)
Beta Blockers   Reason not to administer Beta Blockers:Not Applicable 

## 2012-01-17 NOTE — Transfer of Care (Signed)
  Anesthesia Post-op Note  Patient: [name]  Procedure(s) Performed: Procedure(s) (LRB) with comments: HYSTEROSCOPY WITH THERMACHOICE (N/A) - total therapy time: 9:13sec  D5W  18 ml in, D5W   18ml out, temperture 87degrees celcious  Patient Location: PACU  Anesthesia Type: General  Level of Consciousness: awake, alert , oriented and patient cooperative  Airway and Oxygen Therapy: Patient Spontanous Breathing and Patient connected to face mask oxygen  Post-op Pain: mild  Post-op Assessment: Post-op Vital signs reviewed, Patient's Cardiovascular Status Stable, Respiratory Function Stable, Patent Airway and No signs of Nausea or vomiting  Post-op Vital Signs: Reviewed and stable  Complications: No apparent anesthesia complications

## 2012-01-19 ENCOUNTER — Encounter (HOSPITAL_COMMUNITY): Payer: Self-pay | Admitting: Obstetrics & Gynecology

## 2012-01-23 ENCOUNTER — Observation Stay (HOSPITAL_COMMUNITY)
Admission: EM | Admit: 2012-01-23 | Discharge: 2012-01-24 | Disposition: A | Discharge status: Home / Self Care | Payer: Medicaid Other | Attending: Internal Medicine | Admitting: Internal Medicine

## 2012-01-23 ENCOUNTER — Emergency Department (HOSPITAL_COMMUNITY): Payer: Medicaid Other

## 2012-01-23 ENCOUNTER — Observation Stay (HOSPITAL_COMMUNITY): Payer: Medicaid Other

## 2012-01-23 ENCOUNTER — Encounter (HOSPITAL_COMMUNITY): Payer: Self-pay | Admitting: *Deleted

## 2012-01-23 DIAGNOSIS — K219 Gastro-esophageal reflux disease without esophagitis: Secondary | ICD-10-CM

## 2012-01-23 DIAGNOSIS — J449 Chronic obstructive pulmonary disease, unspecified: Secondary | ICD-10-CM | POA: Insufficient documentation

## 2012-01-23 DIAGNOSIS — D229 Melanocytic nevi, unspecified: Secondary | ICD-10-CM

## 2012-01-23 DIAGNOSIS — R1310 Dysphagia, unspecified: Secondary | ICD-10-CM

## 2012-01-23 DIAGNOSIS — E119 Type 2 diabetes mellitus without complications: Secondary | ICD-10-CM | POA: Insufficient documentation

## 2012-01-23 DIAGNOSIS — D509 Iron deficiency anemia, unspecified: Secondary | ICD-10-CM | POA: Diagnosis present

## 2012-01-23 DIAGNOSIS — M549 Dorsalgia, unspecified: Secondary | ICD-10-CM

## 2012-01-23 DIAGNOSIS — Z6841 Body Mass Index (BMI) 40.0 and over, adult: Secondary | ICD-10-CM | POA: Insufficient documentation

## 2012-01-23 DIAGNOSIS — E1159 Type 2 diabetes mellitus with other circulatory complications: Secondary | ICD-10-CM | POA: Diagnosis present

## 2012-01-23 DIAGNOSIS — Z720 Tobacco use: Secondary | ICD-10-CM

## 2012-01-23 DIAGNOSIS — R000 Tachycardia, unspecified: Secondary | ICD-10-CM

## 2012-01-23 DIAGNOSIS — G510 Bell's palsy: Principal | ICD-10-CM | POA: Diagnosis present

## 2012-01-23 DIAGNOSIS — Z9981 Dependence on supplemental oxygen: Secondary | ICD-10-CM

## 2012-01-23 DIAGNOSIS — J4489 Other specified chronic obstructive pulmonary disease: Secondary | ICD-10-CM | POA: Insufficient documentation

## 2012-01-23 DIAGNOSIS — K259 Gastric ulcer, unspecified as acute or chronic, without hemorrhage or perforation: Secondary | ICD-10-CM

## 2012-01-23 DIAGNOSIS — G629 Polyneuropathy, unspecified: Secondary | ICD-10-CM

## 2012-01-23 DIAGNOSIS — E1142 Type 2 diabetes mellitus with diabetic polyneuropathy: Secondary | ICD-10-CM | POA: Diagnosis present

## 2012-01-23 DIAGNOSIS — G5603 Carpal tunnel syndrome, bilateral upper limbs: Secondary | ICD-10-CM

## 2012-01-23 DIAGNOSIS — E871 Hypo-osmolality and hyponatremia: Secondary | ICD-10-CM

## 2012-01-23 DIAGNOSIS — G459 Transient cerebral ischemic attack, unspecified: Secondary | ICD-10-CM

## 2012-01-23 DIAGNOSIS — G5600 Carpal tunnel syndrome, unspecified upper limb: Secondary | ICD-10-CM | POA: Diagnosis present

## 2012-01-23 DIAGNOSIS — J45909 Unspecified asthma, uncomplicated: Secondary | ICD-10-CM | POA: Diagnosis present

## 2012-01-23 DIAGNOSIS — R29898 Other symptoms and signs involving the musculoskeletal system: Secondary | ICD-10-CM

## 2012-01-23 DIAGNOSIS — R918 Other nonspecific abnormal finding of lung field: Secondary | ICD-10-CM

## 2012-01-23 DIAGNOSIS — M6281 Muscle weakness (generalized): Secondary | ICD-10-CM | POA: Insufficient documentation

## 2012-01-23 DIAGNOSIS — I10 Essential (primary) hypertension: Secondary | ICD-10-CM | POA: Diagnosis present

## 2012-01-23 DIAGNOSIS — J9610 Chronic respiratory failure, unspecified whether with hypoxia or hypercapnia: Secondary | ICD-10-CM

## 2012-01-23 DIAGNOSIS — R0902 Hypoxemia: Secondary | ICD-10-CM

## 2012-01-23 DIAGNOSIS — N920 Excessive and frequent menstruation with regular cycle: Secondary | ICD-10-CM

## 2012-01-23 DIAGNOSIS — J441 Chronic obstructive pulmonary disease with (acute) exacerbation: Secondary | ICD-10-CM

## 2012-01-23 DIAGNOSIS — M2550 Pain in unspecified joint: Secondary | ICD-10-CM

## 2012-01-23 DIAGNOSIS — H538 Other visual disturbances: Secondary | ICD-10-CM | POA: Insufficient documentation

## 2012-01-23 DIAGNOSIS — F329 Major depressive disorder, single episode, unspecified: Secondary | ICD-10-CM

## 2012-01-23 DIAGNOSIS — R209 Unspecified disturbances of skin sensation: Secondary | ICD-10-CM | POA: Insufficient documentation

## 2012-01-23 LAB — COMPREHENSIVE METABOLIC PANEL
ALT: 10 U/L (ref 0–35)
AST: 9 U/L (ref 0–37)
Albumin: 3.4 g/dL — ABNORMAL LOW (ref 3.5–5.2)
Alkaline Phosphatase: 39 U/L (ref 39–117)
BUN: 9 mg/dL (ref 6–23)
CO2: 25 mEq/L (ref 19–32)
Calcium: 9.3 mg/dL (ref 8.4–10.5)
Chloride: 99 mEq/L (ref 96–112)
Creatinine, Ser: 0.61 mg/dL (ref 0.50–1.10)
GFR calc Af Amer: >90 mL/min (ref >90)
GFR calc non Af Amer: >90 mL/min (ref >90)
Glucose, Bld: 97 mg/dL (ref 70–99)
Potassium: 3.4 mEq/L — ABNORMAL LOW (ref 3.5–5.1)
Sodium: 135 mEq/L (ref 135–145)
Total Bilirubin: 0.4 mg/dL (ref 0.3–1.2)
Total Protein: 6.7 g/dL (ref 6.0–8.3)

## 2012-01-23 LAB — POCT I-STAT TROPONIN I: Troponin i, poc: 0 ng/mL (ref 0.00–0.08)

## 2012-01-23 LAB — APTT: aPTT: 34 seconds (ref 24–37)

## 2012-01-23 LAB — CBC
HCT: 30.7 % — ABNORMAL LOW (ref 36.0–46.0)
Hemoglobin: 9.4 g/dL — ABNORMAL LOW (ref 12.0–15.0)
MCH: 22.2 pg — ABNORMAL LOW (ref 26.0–34.0)
MCHC: 30.6 g/dL (ref 30.0–36.0)
MCV: 72.6 fL — ABNORMAL LOW (ref 78.0–100.0)
Platelets: 273 10*3/uL (ref 150–400)
RBC: 4.23 MIL/uL (ref 3.87–5.11)
RDW: 18 % — ABNORMAL HIGH (ref 11.5–15.5)
WBC: 12.9 10*3/uL — ABNORMAL HIGH (ref 4.0–10.5)

## 2012-01-23 LAB — RAPID URINE DRUG SCREEN, HOSP PERFORMED
Amphetamines: NOT DETECTED
Barbiturates: NOT DETECTED
Benzodiazepines: NOT DETECTED
Cocaine: NOT DETECTED
Opiates: NOT DETECTED
Tetrahydrocannabinol: POSITIVE — AB

## 2012-01-23 LAB — POCT I-STAT, CHEM 8
BUN: 8 mg/dL (ref 6–23)
Calcium, Ion: 1.21 mmol/L (ref 1.12–1.23)
Chloride: 105 mEq/L (ref 96–112)
Creatinine, Ser: 0.8 mg/dL (ref 0.50–1.10)
Glucose, Bld: 100 mg/dL — ABNORMAL HIGH (ref 70–99)
HCT: 30 % — ABNORMAL LOW (ref 36.0–46.0)
Hemoglobin: 10.2 g/dL — ABNORMAL LOW (ref 12.0–15.0)
Potassium: 3.5 mEq/L (ref 3.5–5.1)
Sodium: 141 mEq/L (ref 135–145)
TCO2: 22 mmol/L (ref 0–100)

## 2012-01-23 LAB — DIFFERENTIAL
Basophils Absolute: 0 10*3/uL (ref 0.0–0.1)
Basophils Relative: 0 % (ref 0–1)
Eosinophils Absolute: 0.1 10*3/uL (ref 0.0–0.7)
Eosinophils Relative: 1 % (ref 0–5)
Lymphocytes Relative: 30 % (ref 12–46)
Lymphs Abs: 3.9 10*3/uL (ref 0.7–4.0)
Monocytes Absolute: 0.7 10*3/uL (ref 0.1–1.0)
Monocytes Relative: 6 % (ref 3–12)
Neutro Abs: 8.2 10*3/uL — ABNORMAL HIGH (ref 1.7–7.7)
Neutrophils Relative %: 63 % (ref 43–77)

## 2012-01-23 LAB — GLUCOSE, CAPILLARY: Glucose-Capillary: 94 mg/dL (ref 70–99)

## 2012-01-23 LAB — TROPONIN I: Troponin I: <0.3 ng/mL (ref <0.30)

## 2012-01-23 LAB — PROTIME-INR
INR: 1.04 (ref 0.00–1.49)
Prothrombin Time: 13.5 seconds (ref 11.6–15.2)

## 2012-01-23 LAB — PREGNANCY, URINE: Preg Test, Ur: NEGATIVE

## 2012-01-23 MED ORDER — NICOTINE 14 MG/24HR TD PT24
14.0000 mg | MEDICATED_PATCH | Freq: Every day | TRANSDERMAL | Status: DC
  Administered 2012-01-23 – 2012-01-24 (×2): 14 mg via TRANSDERMAL
  Filled 2012-01-23 (×2): qty 1

## 2012-01-23 MED ORDER — PANTOPRAZOLE SODIUM 40 MG PO TBEC: 40.0000 mg | DELAYED_RELEASE_TABLET | Freq: Every day | ORAL | Status: DC

## 2012-01-23 MED ORDER — ONDANSETRON HCL 4 MG/2ML IJ SOLN: 4.0000 mg | Freq: Three times a day (TID) | INTRAMUSCULAR | Status: DC | PRN

## 2012-01-23 MED ORDER — ALBUTEROL SULFATE (5 MG/ML) 0.5% IN NEBU: 2.5000 mg | INHALATION_SOLUTION | Freq: Four times a day (QID) | RESPIRATORY_TRACT | Status: DC | PRN

## 2012-01-23 MED ORDER — GABAPENTIN 300 MG PO CAPS
300.0000 mg | ORAL_CAPSULE | Freq: Three times a day (TID) | ORAL | Status: DC
  Administered 2012-01-23 – 2012-01-24 (×2): 300 mg via ORAL
  Filled 2012-01-23 (×2): qty 1

## 2012-01-23 MED ORDER — CITALOPRAM HYDROBROMIDE 20 MG PO TABS
20.0000 mg | ORAL_TABLET | Freq: Every morning | ORAL | Status: DC
  Administered 2012-01-24: 20 mg via ORAL
  Filled 2012-01-23: qty 1

## 2012-01-23 MED ORDER — SODIUM CHLORIDE 0.9 % IV SOLN
INTRAVENOUS | Status: AC
  Administered 2012-01-23: 22:00:00 via INTRAVENOUS

## 2012-01-23 MED ORDER — MULTIVITAMINS PO CAPS: 1.0000 | ORAL_CAPSULE | Freq: Every day | ORAL | Status: DC

## 2012-01-23 MED ORDER — ASPIRIN 325 MG PO TABS
325.0000 mg | ORAL_TABLET | Freq: Every day | ORAL | Status: DC
  Administered 2012-01-23 – 2012-01-24 (×2): 325 mg via ORAL
  Filled 2012-01-23 (×2): qty 1

## 2012-01-23 MED ORDER — FERROUS SULFATE 325 (65 FE) MG PO TABS
325.0000 mg | ORAL_TABLET | Freq: Every day | ORAL | Status: DC
  Administered 2012-01-24: 325 mg via ORAL
  Filled 2012-01-23: qty 1

## 2012-01-23 MED ORDER — PREDNISONE 20 MG PO TABS
80.0000 mg | ORAL_TABLET | Freq: Every day | ORAL | Status: DC
  Administered 2012-01-24: 80 mg via ORAL
  Filled 2012-01-23: qty 4

## 2012-01-23 MED ORDER — VALACYCLOVIR HCL 500 MG PO TABS
1000.0000 mg | ORAL_TABLET | Freq: Once | ORAL | Status: AC
  Administered 2012-01-23: 1000 mg via ORAL
  Filled 2012-01-23: qty 2

## 2012-01-23 MED ORDER — ADULT MULTIVITAMIN W/MINERALS CH
1.0000 | ORAL_TABLET | Freq: Every day | ORAL | Status: DC
  Administered 2012-01-24: 1 via ORAL
  Filled 2012-01-23: qty 1

## 2012-01-23 MED ORDER — OXYCODONE-ACETAMINOPHEN 5-325 MG PO TABS
1.0000 | ORAL_TABLET | ORAL | Status: DC | PRN
  Administered 2012-01-23 – 2012-01-24 (×2): 1 via ORAL
  Filled 2012-01-23 (×2): qty 1

## 2012-01-23 MED ORDER — VALACYCLOVIR HCL 500 MG PO TABS
1000.0000 mg | ORAL_TABLET | Freq: Three times a day (TID) | ORAL | Status: DC
  Administered 2012-01-24: 1000 mg via ORAL
  Filled 2012-01-23 (×8): qty 2

## 2012-01-23 MED ORDER — MOMETASONE FURO-FORMOTEROL FUM 100-5 MCG/ACT IN AERO
2.0000 | INHALATION_SPRAY | Freq: Two times a day (BID) | RESPIRATORY_TRACT | Status: DC
  Administered 2012-01-23: 2 via RESPIRATORY_TRACT
  Filled 2012-01-23: qty 8.8

## 2012-01-23 MED ORDER — INSULIN ASPART 100 UNIT/ML ~~LOC~~ SOLN
0.0000 [IU] | Freq: Three times a day (TID) | SUBCUTANEOUS | Status: DC
  Administered 2012-01-24: 2 [IU] via SUBCUTANEOUS

## 2012-01-23 MED ORDER — IRBESARTAN 150 MG PO TABS
150.0000 mg | ORAL_TABLET | Freq: Every day | ORAL | Status: DC
  Administered 2012-01-24: 150 mg via ORAL
  Filled 2012-01-23: qty 1

## 2012-01-23 MED ORDER — PREDNISONE 20 MG PO TABS
60.0000 mg | ORAL_TABLET | Freq: Once | ORAL | Status: AC
  Administered 2012-01-23: 60 mg via ORAL
  Filled 2012-01-23: qty 3

## 2012-01-23 NOTE — ED Provider Notes (Signed)
History   This chart was scribed for Glynn Octave, MD by Gerlean Ren. This patient was seen in room [facility] and the patient's care was started at 4:33PM.   CSN: [id number]  Arrival date & time 01/23/12  1604   First MD Initiated Contact with Patient 01/23/12 1630      Chief Complaint  Patient presents with  . Cerebrovascular Accident    (Consider location/radiation/quality/duration/timing/severity/associated sxs/prior treatment) Patient is a 46 y.o. female presenting with Acute Neurological Problem. The history is provided by the patient. No language interpreter was used.  Cerebrovascular Accident  [name] is a 46 y.o. female who presents to the Emergency Department complaining of possible CVA.  Pt reports right-side facial drooping beginning 3 hours PTA.  Pt further reports right-sided neck stiffness, upper right arm soreness, and lower right arm and hand tingling gradual onset 2 weeks ago and waxing-and-waning with associated weakness since.  Pt reports associated blurry vision in right eye and nausea this morning.  Pt denies fever, neck pain, sore throat, CP, cough, SOB, abdominal pain, emesis, diarrhea, urinary symptoms, back pain, HA, and rash as associated symptoms.  Pt has h/o DM, COPD, and HTN.  No h/o CVA.  Pt is a current everyday smoker (1pack/day) and reports social alcohol use.      Past Medical History  Diagnosis Date  . Diabetes mellitus   . COPD (chronic obstructive pulmonary disease)   . HTN (hypertension)   . Low back pain   . Tachycardia     never had test done since no insurance  . Depression   . Asthma   . Shortness of breath   . Anxiety   . Gastric erosions     EGD 08/2010.  . Internal hemorrhoids     Colonoscopy 5/12.  [name] Heavy menses   . Chronic respiratory failure with hypoxia     On 2-3 L of oxygen at home  . GERD (gastroesophageal reflux disease)   . Anemia     Past Surgical History  Procedure Date  . Cholecystectomy 1990  .  Cesarean section     twice  . Kidney surgery     as child for blockages  . Tubal ligation   . Tonsillectomy   . Wrist surgery 1995    Lt wrist  . Tympanostomy tube placement   . Hysteroscopy with thermachoice 01/17/2012    Procedure: HYSTEROSCOPY WITH THERMACHOICE;  Surgeon: Lazaro Arms, MD;  Location: [facility];  Service: Gynecology;  Laterality: N/A;  total therapy time: 9:13sec  D5W  18 ml in, D5W   18ml out, temperture 87degrees celcious    Family History  Problem Relation Age of Onset  . Heart attack Father 74    deceased, etoh use  . Heart disease Father   . Heart attack Mother 66    deceased  . Diabetes Mother   . Breast cancer Mother   . Heart failure Mother     oxygen dependence, nonsmoker  . Heart disease Mother   . Depression Mother   . Cancer Mother   . Colon cancer Neg Hx   . Liver disease Maternal Aunt 40    died while on liver transplant list  . Heart attack Maternal Grandmother     premature CAD  . Ulcers Sister   . Hypertension Sister     History  Substance Use Topics  . Smoking status: Current Every Day Smoker -- 1.0 packs/day for 30 years    Types: Cigarettes  .  Smokeless tobacco: Never Used  . Alcohol Use: Yes     social use    No Ob history provided.   Review of Systems A complete 10 system review of systems was obtained and all systems are negative except as noted in the HPI and PMH.   Allergies  Codeine and Wellbutrin  Home Medications   Current Outpatient Rx  Name Route Sig Dispense Refill  . ALBUTEROL SULFATE HFA 108 (90 BASE) MCG/ACT IN AERS Inhalation Inhale 2 puffs into the lungs every 4 (four) hours as needed for wheezing or shortness of breath. Wheezing/shortnes of breath 1 Inhaler 3  . ALBUTEROL SULFATE (5 MG/ML) 0.5% IN NEBU Nebulization Take 2.5 mg by nebulization 4 (four) times daily as needed.    [name] CITALOPRAM HYDROBROMIDE 20 MG PO TABS Oral Take 1 tablet (20 mg total) by mouth daily. 30 tablet 3  . FERROUS SULFATE 325 (65  FE) MG PO TABS Oral Take 325 mg by mouth daily with breakfast.    . FLUTICASONE PROPIONATE 50 MCG/ACT NA SUSP Nasal Place 2 sprays into the nose daily. 16 g 3  . GABAPENTIN 300 MG PO CAPS Oral Take 1 capsule (300 mg total) by mouth 3 (three) times daily. 90 capsule 2  . IBUPROFEN 200 MG PO TABS Oral Take 200-400 mg by mouth daily as needed. For pain     . KETOROLAC TROMETHAMINE 10 MG PO TABS Oral Take 1 tablet (10 mg total) by mouth every 8 (eight) hours as needed for pain. 15 tablet 0  . METFORMIN HCL 500 MG PO TABS  TAKE ONE TABLET BY MOUTH TWICE DAILY WITH MEALS 60 tablet 3  . MOMETASONE FURO-FORMOTEROL FUM 100-5 MCG/ACT IN AERO Inhalation Inhale 2 puffs into the lungs 2 (two) times daily. 1 Inhaler 0  . MULTIVITAMINS PO CAPS Oral Take 1 capsule by mouth daily.    [name] OLMESARTAN MEDOXOMIL 20 MG PO TABS Oral Take 1 tablet (20 mg total) by mouth daily. 30 tablet 3  . OMEPRAZOLE 20 MG PO CPDR  TAKE ONE CAPSULE BY MOUTH EVERY DAY 30 capsule 5  . ONDANSETRON HCL 8 MG PO TABS Oral Take 1 tablet (8 mg total) by mouth every 8 (eight) hours as needed for nausea. 10 tablet 0  . OXYCODONE-ACETAMINOPHEN 5-325 MG PO TABS Oral Take 1 tablet by mouth 3 (three) times daily as needed for pain. 90 tablet 0  . ROBITUSSIN CF PO Oral Take 5-10 mLs by mouth as needed. For cough    . RANITIDINE HCL 150 MG PO TABS Oral Take 1 tablet (150 mg total) by mouth 2 (two) times daily. 30 tablet 11    BP 147/70  Pulse 80  Temp 99 F (37.2 C) (Oral)  Resp 20  Ht 5\' 4"  (1.626 m)  Wt 250 lb (113.399 kg)  BMI 42.91 kg/m2  SpO2 99%  LMP 01/17/2012  Physical Exam  Nursing note and vitals reviewed. Constitutional: She is oriented to person, place, and time. She appears well-developed.  HENT:  Head: Normocephalic and atraumatic.  Mouth/Throat: Oropharynx is clear and moist.       Right-sided facial droop.  Right forehead not spared.  Able to wrinkle right eyebrow, but less so than left side. Uvula midline. Right TM  perforation.  Eyes: Conjunctivae normal and EOM are normal.  Neck: Normal range of motion. No tracheal deviation present.  Cardiovascular: Normal rate, regular rhythm and normal heart sounds.   No murmur heard. Pulmonary/Chest: Effort normal and breath sounds normal.  She has no wheezes.  Abdominal: Soft. There is no tenderness.  Musculoskeletal: Normal range of motion.  Neurological: She is alert and oriented to person, place, and time. No cranial nerve deficit. Coordination normal.       No ataxia. Right arm 4/5 strength. Left arm 5/5 strength.   No upper extremity drift.    Skin: Skin is warm.  Psychiatric: She has a normal mood and affect.    ED Course  Procedures (including critical care time) DIAGNOSTIC STUDIES: Oxygen Saturation is 99% on room air, normal by my interpretation.    COORDINATION OF CARE: 4:40PM- Ordered protime-INR, APTT, CBC, c-met, troponin, urinalysis, chem 8, POCT CBG monitoring, and head CT. 6:23PM- Re-check, informed pt of negative head and brain CTs.  Discussed possible Bells Palsy and admittance.    Labs Reviewed - No data to display No results found. Results for orders placed during the hospital encounter of 01/23/12  PROTIME-INR      Component Value Range   Prothrombin Time 13.5  11.6 - 15.2 seconds   INR 1.04  0.00 - 1.49  APTT      Component Value Range   aPTT 34  24 - 37 seconds  CBC      Component Value Range   WBC 12.9 (*) 4.0 - 10.5 K/uL   RBC 4.23  3.87 - 5.11 MIL/uL   Hemoglobin 9.4 (*) 12.0 - 15.0 g/dL   HCT 40.9 (*) 81.1 - 91.4 %   MCV 72.6 (*) 78.0 - 100.0 fL   MCH 22.2 (*) 26.0 - 34.0 pg   MCHC 30.6  30.0 - 36.0 g/dL   RDW 78.2 (*) 95.6 - 21.3 %   Platelets 273  150 - 400 K/uL  DIFFERENTIAL      Component Value Range   Neutrophils Relative 63  43 - 77 %   Neutro Abs 8.2 (*) 1.7 - 7.7 K/uL   Lymphocytes Relative 30  12 - 46 %   Lymphs Abs 3.9  0.7 - 4.0 K/uL   Monocytes Relative 6  3 - 12 %   Monocytes Absolute 0.7  0.1 -  1.0 K/uL   Eosinophils Relative 1  0 - 5 %   Eosinophils Absolute 0.1  0.0 - 0.7 K/uL   Basophils Relative 0  0 - 1 %   Basophils Absolute 0.0  0.0 - 0.1 K/uL  COMPREHENSIVE METABOLIC PANEL      Component Value Range   Sodium 135  135 - 145 mEq/L   Potassium 3.4 (*) 3.5 - 5.1 mEq/L   Chloride 99  96 - 112 mEq/L   CO2 25  19 - 32 mEq/L   Glucose, Bld 97  70 - 99 mg/dL   BUN 9  6 - 23 mg/dL   Creatinine, Ser 0.86  0.50 - 1.10 mg/dL   Calcium 9.3  8.4 - 57.8 mg/dL   Total Protein 6.7  6.0 - 8.3 g/dL   Albumin 3.4 (*) 3.5 - 5.2 g/dL   AST 9  0 - 37 U/L   ALT 10  0 - 35 U/L   Alkaline Phosphatase 39  39 - 117 U/L   Total Bilirubin 0.4  0.3 - 1.2 mg/dL   GFR calc non Af Amer >90  >90 mL/min   GFR calc Af Amer >90  >90 mL/min  TROPONIN I      Component Value Range   Troponin I <0.30  <0.30 ng/mL  GLUCOSE, CAPILLARY      Component  Value Range   Glucose-Capillary 94  70 - 99 mg/dL  POCT I-STAT, CHEM 8      Component Value Range   Sodium 141  135 - 145 mEq/L   Potassium 3.5  3.5 - 5.1 mEq/L   Chloride 105  96 - 112 mEq/L   BUN 8  6 - 23 mg/dL   Creatinine, Ser 1.61  0.50 - 1.10 mg/dL   Glucose, Bld 096 (*) 70 - 99 mg/dL   Calcium, Ion 0.45  4.09 - 1.23 mmol/L   TCO2 22  0 - 100 mmol/L   Hemoglobin 10.2 (*) 12.0 - 15.0 g/dL   HCT 81.1 (*) 91.4 - 78.2 %  POCT I-STAT TROPONIN I      Component Value Range   Troponin i, poc 0.00  0.00 - 0.08 ng/mL   Comment 3             Ct Head Wo Contrast  01/23/2012  *RADIOLOGY REPORT*  Clinical Data: CVA.  CT HEAD WITHOUT CONTRAST  Technique:  Contiguous axial images were obtained from the base of the skull through the vertex without contrast.  Comparison: 11/30/2010.  Findings: The ventricles are normal.  No extra-axial fluid collections are seen.  The brainstem and cerebellum are unremarkable.  No acute intracranial findings such as infarction or hemorrhage.  No mass lesions.  The bony calvarium is intact. The paranasal sinuses are grossly  clear.  Right mastoid and middle ear effusions are noted.  IMPRESSION: No acute intracranial findings or mass lesion. Right mastoid and middle ear effusion.   Original Report Authenticated By: P. Loralie Champagne, M.D.    Mr Brain Wo Contrast  01/23/2012  *RADIOLOGY REPORT*  Clinical Data: right facial numbness.  Right arm numbness.  MRI HEAD WITHOUT CONTRAST  Technique:  Multiplanar, multiecho pulse sequences of the brain and surrounding structures were obtained according to standard protocol without intravenous contrast.  Comparison: Head CT same day  Findings: The brain has a normal appearance on all pulse sequences without evidence of atrophy, old or acute infarction, mass lesion, hemorrhage, hydrocephalus or extra-axial collection.  No pituitary mass.  No skull or skull base lesion. There is some fluid in the right mastoid region and middle ear.  IMPRESSION: Normal MRI of the brain.   Original Report Authenticated By: Thomasenia Sales, M.D.     No diagnosis found.    MDM  Right Sided facial droop that started at 2 PM. Patient also endorses difficulty with right arm numbness and tingling for the past 2 weeks. Code stroke called on arrival.  CT head negative. Neurology suspects Bell's palsy that is partial.  Not tPA candidate given R arm symptoms for several weeks.  Neurology recommends TIA/CVA work up since not clear cut Bell's palsy. Arm weakness has improved.  Patient with risk factors of HTN, HLD, DM, tobacco abuse.   Date: 01/23/2012  Rate: 82  Rhythm: normal sinus rhythm  QRS Axis: normal  Intervals: normal  ST/T Wave abnormalities: normal  Conduction Disutrbances:none  Narrative Interpretation:   Old EKG Reviewed: unchanged  CRITICAL CARE Performed by: Glynn Octave   Total critical care time: 30  Critical care time was exclusive of separately billable procedures and treating other patients.  Critical care was necessary to treat or prevent imminent or life-threatening  deterioration.  Critical care was time spent personally by me on the following activities: development of treatment plan with patient and/or surrogate as well as nursing, discussions with consultants, evaluation of patient's response  to treatment, examination of patient, obtaining history from patient or surrogate, ordering and performing treatments and interventions, ordering and review of laboratory studies, ordering and review of radiographic studies, pulse oximetry and re-evaluation of patient's condition.    I personally performed the services described in this documentation, which was scribed in my presence.  The recorded information has been reviewed and considered.        Glynn Octave, MD 01/23/12 [phone]

## 2012-01-23 NOTE — ED Notes (Signed)
Patient transported to CT 

## 2012-01-23 NOTE — H&P (Signed)
[name] is an 46 y.o. female.    PCP: Milinda Antis, MD   Chief Complaint: Right face, weakness, difficulty to close eyes  HPI: This is a 46 year old, Caucasian female, with a past medical history of COPD, diabetes, hypertension, who was in her usual state of health of this morning when she was getting ready to go to her dentist. Then she noticed that she couldn't close her right eye. She also noticed asymmetry in her face. She couldn't swallow properly. She couldn't, swish. She has had Bell's palsy before and she suspected the same. Patient also mentioned that over the past couple of weeks. She's had some numbness and pain in the right arm and shoulder area. The numbness is in the fingers. She's also noticed stiffness. She went to her primary care physician's office for this and was told that she may need quite some kind of a brace. She also has had some weakness in the right arm. And has not been able to hold things properly. Denies any numbness or weakness in the legs. She's been able to ambulate without any difficulties. She's also had some blurry vision lately. No headaches earlier, but has been having headaches since the MRI was done. Denies any shortness of breath that is more than usual. She also tells me that she recently underwent endometrial ablation about a week ago, and has been having some abdominal cramps with vaginal bleeding.   Home Medications: Prior to Admission medications   Medication Sig Start Date End Date Taking? Authorizing Provider  albuterol (PROVENTIL HFA) 108 (90 BASE) MCG/ACT inhaler Inhale 2 puffs into the lungs every 4 (four) hours as needed for wheezing or shortness of breath. Wheezing/shortnes of breath 11/09/11  Yes Salley Scarlet, MD  albuterol (PROVENTIL) (5 MG/ML) 0.5% nebulizer solution Take 2.5 mg by nebulization 4 (four) times daily as needed. For shortness of breath 10/25/11 10/24/12 Yes Erick Blinks, MD  citalopram (CELEXA) 20 MG tablet Take 20  mg by mouth every morning. 08/31/11 08/30/12 Yes Salley Scarlet, MD  ferrous sulfate (IRON SUPPLEMENT) 325 (65 FE) MG tablet Take 325 mg by mouth daily with breakfast.   Yes Historical Provider, MD  fluticasone (FLONASE) 50 MCG/ACT nasal spray Place 2 sprays into the nose daily. 01/11/12  Yes Salley Scarlet, MD  gabapentin (NEURONTIN) 300 MG capsule Take 1 capsule (300 mg total) by mouth 3 (three) times daily. 11/09/11 11/08/12 Yes Salley Scarlet, MD  ibuprofen (ADVIL,MOTRIN) 200 MG tablet Take 200-400 mg by mouth daily as needed. For pain    Yes Historical Provider, MD  ketorolac (TORADOL) 10 MG tablet Take 1 tablet (10 mg total) by mouth every 8 (eight) hours as needed for pain. 01/17/12  Yes Lazaro Arms, MD  metFORMIN (GLUCOPHAGE) 500 MG tablet Take 500 mg by mouth 2 (two) times daily.   Yes Historical Provider, MD  mometasone-formoterol (DULERA) 100-5 MCG/ACT AERO Inhale 2 puffs into the lungs 2 (two) times daily. 12/22/11 12/21/12 Yes Nyoka Cowden, MD  Multiple Vitamin (MULTIVITAMIN) capsule Take 1 capsule by mouth daily.   Yes Historical Provider, MD  olmesartan (BENICAR) 20 MG tablet Take 20 mg by mouth every morning. 01/11/12  Yes Salley Scarlet, MD  omeprazole (PRILOSEC) 20 MG capsule Take 20 mg by mouth every morning.   Yes Historical Provider, MD  ondansetron (ZOFRAN) 8 MG tablet Take 1 tablet (8 mg total) by mouth every 8 (eight) hours as needed for nausea. 01/17/12  Yes Amaryllis Dyke  Eure, MD  oxyCODONE-acetaminophen (PERCOCET/ROXICET) 5-325 MG per tablet Take 1 tablet by mouth 3 (three) times daily as needed for pain. 01/11/12 02/27/12 Yes Salley Scarlet, MD  ranitidine (ZANTAC) 150 MG tablet Take 1 tablet (150 mg total) by mouth 2 (two) times daily. 12/22/11 12/21/12 Yes Nyoka Cowden, MD    Allergies:  Allergies  Allergen Reactions  . Codeine Itching and Nausea Only  . Wellbutrin (Bupropion Hcl) Hives    Past Medical History: Past Medical History  Diagnosis Date  . Diabetes  mellitus   . COPD (chronic obstructive pulmonary disease)   . HTN (hypertension)   . Low back pain   . Tachycardia     never had test done since no insurance  . Depression   . Asthma   . Shortness of breath   . Anxiety   . Gastric erosions     EGD 08/2010.  . Internal hemorrhoids     Colonoscopy 5/12.  [name] Heavy menses   . Chronic respiratory failure with hypoxia     On 2-3 L of oxygen at home  . GERD (gastroesophageal reflux disease)   . Anemia     Past Surgical History  Procedure Date  . Cholecystectomy 1990  . Cesarean section     twice  . Kidney surgery     as child for blockages  . Tubal ligation   . Tonsillectomy   . Wrist surgery 1995    Lt wrist  . Tympanostomy tube placement   . Hysteroscopy with thermachoice 01/17/2012    Procedure: HYSTEROSCOPY WITH THERMACHOICE;  Surgeon: Lazaro Arms, MD;  Location: [facility];  Service: Gynecology;  Laterality: N/A;  total therapy time: 9:13sec  D5W  18 ml in, D5W   18ml out, temperture 87degrees celcious    Social History:  reports that she has been smoking Cigarettes.  She has a 30 pack-year smoking history. She has never used smokeless tobacco. She reports that she drinks alcohol. She reports that she does not use illicit drugs.  Family History:  Family History  Problem Relation Age of Onset  . Heart attack Father 59    deceased, etoh use  . Heart disease Father   . Heart attack Mother 40    deceased  . Diabetes Mother   . Breast cancer Mother   . Heart failure Mother     oxygen dependence, nonsmoker  . Heart disease Mother   . Depression Mother   . Cancer Mother   . Colon cancer Neg Hx   . Liver disease Maternal Aunt 40    died while on liver transplant list  . Heart attack Maternal Grandmother     premature CAD  . Ulcers Sister   . Hypertension Sister     Review of Systems - History obtained from the patient General ROS: positive for  - fatigue Psychological ROS: negative Ophthalmic ROS: negative ENT ROS:  negative Allergy and Immunology ROS: negative Hematological and Lymphatic ROS: negative Endocrine ROS: negative Respiratory ROS: no cough, shortness of breath, or wheezing Cardiovascular ROS: no chest pain or dyspnea on exertion Gastrointestinal ROS: no abdominal pain, change in bowel habits, or black or bloody stools Genito-Urinary ROS: no dysuria, trouble voiding, or hematuria Musculoskeletal ROS: negative Neurological ROS: as in hpi Dermatological ROS: negative  Physical Examination Blood pressure 139/62, pulse 93, temperature 99.4 F (37.4 C), temperature source Oral, resp. rate 21, height 5\' 4"  (1.626 m), weight 113.399 kg (250 lb), last menstrual period 01/17/2012, SpO2 96.00%.  General appearance: alert, cooperative, appears stated age, no distress and moderately obese Head: Normocephalic, without obvious abnormality, atraumatic Eyes: conjunctivae/corneas clear. PERRL, EOM's intact.  Throat: lips, mucosa, and tongue normal; teeth and gums normal Neck: Neck had full range of motion. However, she did experience some pain with rotation Back: symmetric, no curvature. ROM normal. No CVA tenderness. Resp: clear to auscultation bilaterally Cardio: regular rate and rhythm, S1, S2 normal, no murmur, click, rub or gallop GI: soft, non-tender; bowel sounds normal; no masses,  no organomegaly Extremities: extremities normal, atraumatic, no cyanosis or edema Pulses: 2+ and symmetric Skin: Skin color, texture, turgor normal.  Lymph nodes: Cervical, supraclavicular, and axillary nodes normal. Neurologic: Is alert and oriented x3. Speech does not appear to be abnormal. She does have difficulty closing the right eye. She has facial asymmetry with a droop on the right side. The tongue deviates to the left. She does not have any motor deficits in the left. Minimal deficits noted on the right. Gait was not assessed.  Laboratory Data: Results for orders placed during the hospital encounter of  01/23/12 (from the past 48 hour(s))  PROTIME-INR     Status: Normal   Collection Time   01/23/12  4:45 PM      Component Value Range Comment   Prothrombin Time 13.5  11.6 - 15.2 seconds    INR 1.04  0.00 - 1.49   APTT     Status: Normal   Collection Time   01/23/12  4:45 PM      Component Value Range Comment   aPTT 34  24 - 37 seconds   CBC     Status: Abnormal   Collection Time   01/23/12  4:45 PM      Component Value Range Comment   WBC 12.9 (*) 4.0 - 10.5 K/uL    RBC 4.23  3.87 - 5.11 MIL/uL    Hemoglobin 9.4 (*) 12.0 - 15.0 g/dL    HCT 16.1 (*) 09.6 - 46.0 %    MCV 72.6 (*) 78.0 - 100.0 fL    MCH 22.2 (*) 26.0 - 34.0 pg    MCHC 30.6  30.0 - 36.0 g/dL    RDW 04.5 (*) 40.9 - 15.5 %    Platelets 273  150 - 400 K/uL   DIFFERENTIAL     Status: Abnormal   Collection Time   01/23/12  4:45 PM      Component Value Range Comment   Neutrophils Relative 63  43 - 77 %    Neutro Abs 8.2 (*) 1.7 - 7.7 K/uL    Lymphocytes Relative 30  12 - 46 %    Lymphs Abs 3.9  0.7 - 4.0 K/uL    Monocytes Relative 6  3 - 12 %    Monocytes Absolute 0.7  0.1 - 1.0 K/uL    Eosinophils Relative 1  0 - 5 %    Eosinophils Absolute 0.1  0.0 - 0.7 K/uL    Basophils Relative 0  0 - 1 %    Basophils Absolute 0.0  0.0 - 0.1 K/uL   COMPREHENSIVE METABOLIC PANEL     Status: Abnormal   Collection Time   01/23/12  4:45 PM      Component Value Range Comment   Sodium 135  135 - 145 mEq/L    Potassium 3.4 (*) 3.5 - 5.1 mEq/L    Chloride 99  96 - 112 mEq/L    CO2 25  19 - 32 mEq/L  Glucose, Bld 97  70 - 99 mg/dL    BUN 9  6 - 23 mg/dL    Creatinine, Ser 1.61  0.50 - 1.10 mg/dL    Calcium 9.3  8.4 - 09.6 mg/dL    Total Protein 6.7  6.0 - 8.3 g/dL    Albumin 3.4 (*) 3.5 - 5.2 g/dL    AST 9  0 - 37 U/L    ALT 10  0 - 35 U/L    Alkaline Phosphatase 39  39 - 117 U/L    Total Bilirubin 0.4  0.3 - 1.2 mg/dL    GFR calc non Af Amer >90  >90 mL/min    GFR calc Af Amer >90  >90 mL/min   TROPONIN I     Status: Normal    Collection Time   01/23/12  4:45 PM      Component Value Range Comment   Troponin I <0.30  <0.30 ng/mL   GLUCOSE, CAPILLARY     Status: Normal   Collection Time   01/23/12  4:59 PM      Component Value Range Comment   Glucose-Capillary 94  70 - 99 mg/dL   POCT I-STAT TROPONIN I     Status: Normal   Collection Time   01/23/12  5:00 PM      Component Value Range Comment   Troponin i, poc 0.00  0.00 - 0.08 ng/mL    Comment 3            POCT I-STAT, CHEM 8     Status: Abnormal   Collection Time   01/23/12  5:02 PM      Component Value Range Comment   Sodium 141  135 - 145 mEq/L    Potassium 3.5  3.5 - 5.1 mEq/L    Chloride 105  96 - 112 mEq/L    BUN 8  6 - 23 mg/dL    Creatinine, Ser 0.45  0.50 - 1.10 mg/dL    Glucose, Bld 409 (*) 70 - 99 mg/dL    Calcium, Ion 8.11  9.14 - 1.23 mmol/L    TCO2 22  0 - 100 mmol/L    Hemoglobin 10.2 (*) 12.0 - 15.0 g/dL    HCT 78.2 (*) 95.6 - 46.0 %   URINE RAPID DRUG SCREEN (HOSP PERFORMED)     Status: Abnormal   Collection Time   01/23/12  6:23 PM      Component Value Range Comment   Opiates NONE DETECTED  NONE DETECTED    Cocaine NONE DETECTED  NONE DETECTED    Benzodiazepines NONE DETECTED  NONE DETECTED    Amphetamines NONE DETECTED  NONE DETECTED    Tetrahydrocannabinol POSITIVE (*) NONE DETECTED    Barbiturates NONE DETECTED  NONE DETECTED     Radiology Reports: Ct Head Wo Contrast  01/23/2012  *RADIOLOGY REPORT*  Clinical Data: CVA.  CT HEAD WITHOUT CONTRAST  Technique:  Contiguous axial images were obtained from the base of the skull through the vertex without contrast.  Comparison: 11/30/2010.  Findings: The ventricles are normal.  No extra-axial fluid collections are seen.  The brainstem and cerebellum are unremarkable.  No acute intracranial findings such as infarction or hemorrhage.  No mass lesions.  The bony calvarium is intact. The paranasal sinuses are grossly clear.  Right mastoid and middle ear effusions are noted.  IMPRESSION: No  acute intracranial findings or mass lesion. Right mastoid and middle ear effusion.   Original Report Authenticated By: P. MARK  Pecolia Ades, M.D.    Mr Brain Wo Contrast  01/23/2012  *RADIOLOGY REPORT*  Clinical Data: right facial numbness.  Right arm numbness.  MRI HEAD WITHOUT CONTRAST  Technique:  Multiplanar, multiecho pulse sequences of the brain and surrounding structures were obtained according to standard protocol without intravenous contrast.  Comparison: Head CT same day  Findings: The brain has a normal appearance on all pulse sequences without evidence of atrophy, old or acute infarction, mass lesion, hemorrhage, hydrocephalus or extra-axial collection.  No pituitary mass.  No skull or skull base lesion. There is some fluid in the right mastoid region and middle ear.  IMPRESSION: Normal MRI of the brain.   Original Report Authenticated By: Thomasenia Sales, M.D.     Electrocardiogram: EKG shows sinus rhythm at 82 beats per minute. Normal axis. Intervals are normal. No concerning ST or T-wave changes. No Q waves.  Assessment/Plan  Principal Problem:  *Bell's palsy Active Problems:  Microcytic anemia  HTN (hypertension)  DM type 2 (diabetes mellitus, type 2)  COPD (chronic obstructive pulmonary disease)  Right arm weakness   #1 Bell's palsy: MRI does not show any acute stroke. She'll be given steroids, and, valacyclovir. Swallow evaluation will be checked.  #2 right arm weakness: The teleneurologist, was concerned about a possible TIA. TIA workup, will be initiated as per their recommendations. Neurology will be consulted. PT/OT will be asked to see the patient. However, I think her right arm symptoms are probably coming from her cervical spine. We will get MRI of the cervical spine to evaluate this area. Neurology consultation will also be followed up on.  #3 history of microcytic anemia with recent endometrial ablation: Continue to monitor. Have asked her that she will need followup  with her gynecologist after her discharge. If bleeding becomes severe this may have to be addressed in the hospital. Urine pregnancy test will be ordered.  #4 history of, diabetes, type II: We'll place her on a sliding scale. CBGs will be monitored.  #5 history of COPD: This is stable. Continue to monitor.  DVT, prophylaxis will be initiated,.  She's a full code.  Further management and decisions will depend on results of further testing and patient's response to treatment.   [facility]  [facility] Pager [phone]  01/23/2012, 8:28 PM

## 2012-01-23 NOTE — ED Notes (Addendum)
Pt presents with rt sided facial drooping since 2 pm, and rt sided arm numbness/tingling x 2 weeks. Bilateral grips equal.

## 2012-01-23 NOTE — ED Notes (Signed)
Tele psych monitor set up in room.  

## 2012-01-24 ENCOUNTER — Observation Stay (HOSPITAL_COMMUNITY): Payer: Medicaid Other

## 2012-01-24 DIAGNOSIS — E119 Type 2 diabetes mellitus without complications: Secondary | ICD-10-CM

## 2012-01-24 DIAGNOSIS — G5600 Carpal tunnel syndrome, unspecified upper limb: Secondary | ICD-10-CM

## 2012-01-24 DIAGNOSIS — G510 Bell's palsy: Secondary | ICD-10-CM

## 2012-01-24 LAB — LIPID PANEL
Cholesterol: 164 mg/dL (ref 0–200)
HDL: 35 mg/dL — ABNORMAL LOW (ref >39)
LDL Cholesterol: 118 mg/dL — ABNORMAL HIGH (ref 0–99)
Total CHOL/HDL Ratio: 4.7 RATIO
Triglycerides: 53 mg/dL (ref <150)
VLDL: 11 mg/dL (ref 0–40)

## 2012-01-24 LAB — GLUCOSE, CAPILLARY
Glucose-Capillary: 167 mg/dL — ABNORMAL HIGH (ref 70–99)
Glucose-Capillary: 126 mg/dL — ABNORMAL HIGH (ref 70–99)

## 2012-01-24 LAB — HEMOGLOBIN A1C
Hgb A1c MFr Bld: 6.5 % — ABNORMAL HIGH (ref <5.7)
Mean Plasma Glucose: 140 mg/dL — ABNORMAL HIGH (ref <117)

## 2012-01-24 MED ORDER — METFORMIN HCL 500 MG PO TABS: 1000.0000 mg | ORAL_TABLET | Freq: Two times a day (BID) | ORAL | Status: DC

## 2012-01-24 MED ORDER — PREDNISONE 10 MG PO TABS: ORAL_TABLET | ORAL | Status: DC

## 2012-01-24 NOTE — Consult Note (Signed)
[facility] [name] A. Sheila Pilgrim, MD     [email or web address]         Reason for Consult: Referring Physician:   [name] is an 46 y.o. female.  HPI:   Past Medical History  Diagnosis Date  . Diabetes mellitus   . COPD (chronic obstructive pulmonary disease)   . HTN (hypertension)   . Low back pain   . Tachycardia     never had test done since no insurance  . Depression   . Asthma   . Shortness of breath   . Anxiety   . Gastric erosions     EGD 08/2010.  . Internal hemorrhoids     Colonoscopy 5/12.  [name] Heavy menses   . Chronic respiratory failure with hypoxia     On 2-3 L of oxygen at home  . GERD (gastroesophageal reflux disease)   . Anemia     Past Surgical History  Procedure Date  . Cholecystectomy 1990  . Cesarean section     twice  . Kidney surgery     as child for blockages  . Tubal ligation   . Tonsillectomy   . Wrist surgery 1995    Lt wrist  . Tympanostomy tube placement   . Hysteroscopy with thermachoice 01/17/2012    Procedure: HYSTEROSCOPY WITH THERMACHOICE;  Surgeon: Lazaro Arms, MD;  Location: [facility];  Service: Gynecology;  Laterality: N/A;  total therapy time: 9:13sec  D5W  18 ml in, D5W   18ml out, temperture 87degrees celcious    Family History  Problem Relation Age of Onset  . Heart attack Father 64    deceased, etoh use  . Heart disease Father   . Heart attack Mother 39    deceased  . Diabetes Mother   . Breast cancer Mother   . Heart failure Mother     oxygen dependence, nonsmoker  . Heart disease Mother   . Depression Mother   . Cancer Mother   . Colon cancer Neg Hx   . Liver disease Maternal Aunt 40    died while on liver transplant list  . Heart attack Maternal Grandmother     premature CAD  . Ulcers Sister   . Hypertension Sister     Social History:  reports that she has been smoking Cigarettes.  She has a 30 pack-year smoking history. She has never used smokeless tobacco. She reports that she drinks  alcohol. She reports that she does not use illicit drugs.  Allergies:  Allergies  Allergen Reactions  . Codeine Itching and Nausea Only  . Wellbutrin (Bupropion Hcl) Hives    Medications: Prior to Admission medications   Medication Sig Start Date End Date Taking? Authorizing Provider  albuterol (PROVENTIL HFA) 108 (90 BASE) MCG/ACT inhaler Inhale 2 puffs into the lungs every 4 (four) hours as needed for wheezing or shortness of breath. Wheezing/shortnes of breath 11/09/11  Yes Salley Scarlet, MD  albuterol (PROVENTIL) (5 MG/ML) 0.5% nebulizer solution Take 2.5 mg by nebulization 4 (four) times daily as needed. For shortness of breath 10/25/11 10/24/12 Yes Erick Blinks, MD  citalopram (CELEXA) 20 MG tablet Take 20 mg by mouth every morning. 08/31/11 08/30/12 Yes Salley Scarlet, MD  ferrous sulfate (IRON SUPPLEMENT) 325 (65 FE) MG tablet Take 325 mg by mouth daily with breakfast.   Yes Historical Provider, MD  fluticasone (FLONASE) 50 MCG/ACT nasal spray Place 2 sprays into the nose daily. 01/11/12  Yes Salley Scarlet, MD  gabapentin (NEURONTIN)  300 MG capsule Take 1 capsule (300 mg total) by mouth 3 (three) times daily. 11/09/11 11/08/12 Yes Salley Scarlet, MD  ibuprofen (ADVIL,MOTRIN) 200 MG tablet Take 200-400 mg by mouth daily as needed. For pain    Yes Historical Provider, MD  ketorolac (TORADOL) 10 MG tablet Take 1 tablet (10 mg total) by mouth every 8 (eight) hours as needed for pain. 01/17/12  Yes Lazaro Arms, MD  metFORMIN (GLUCOPHAGE) 500 MG tablet Take 500 mg by mouth 2 (two) times daily.   Yes Historical Provider, MD  mometasone-formoterol (DULERA) 100-5 MCG/ACT AERO Inhale 2 puffs into the lungs 2 (two) times daily. 12/22/11 12/21/12 Yes Nyoka Cowden, MD  Multiple Vitamin (MULTIVITAMIN) capsule Take 1 capsule by mouth daily.   Yes Historical Provider, MD  olmesartan (BENICAR) 20 MG tablet Take 20 mg by mouth every morning. 01/11/12  Yes Salley Scarlet, MD  omeprazole (PRILOSEC) 20  MG capsule Take 20 mg by mouth every morning.   Yes Historical Provider, MD  ondansetron (ZOFRAN) 8 MG tablet Take 1 tablet (8 mg total) by mouth every 8 (eight) hours as needed for nausea. 01/17/12  Yes Lazaro Arms, MD  oxyCODONE-acetaminophen (PERCOCET/ROXICET) 5-325 MG per tablet Take 1 tablet by mouth 3 (three) times daily as needed for pain. 01/11/12 02/27/12 Yes Salley Scarlet, MD  ranitidine (ZANTAC) 150 MG tablet Take 1 tablet (150 mg total) by mouth 2 (two) times daily. 12/22/11 12/21/12 Yes Nyoka Cowden, MD    Scheduled Meds:   . aspirin  325 mg Oral Daily  . citalopram  20 mg Oral q morning - 10a  . ferrous sulfate  325 mg Oral Q breakfast  . gabapentin  300 mg Oral TID  . insulin aspart  0-15 Units Subcutaneous TID WC  . irbesartan  150 mg Oral Daily  . mometasone-formoterol  2 puff Inhalation BID  . multivitamin with minerals  1 tablet Oral Daily  . nicotine  14 mg Transdermal Daily  . pantoprazole  40 mg Oral Q1200  . predniSONE  60 mg Oral Once  . predniSONE  80 mg Oral Q breakfast  . valACYclovir  1,000 mg Oral Once  . valACYclovir  1,000 mg Oral TID  . DISCONTD: multivitamin  1 capsule Oral Daily   Continuous Infusions:   . sodium chloride 75 mL/hr at 01/23/12 2145   PRN Meds:.albuterol, oxyCODONE-acetaminophen, DISCONTD: ondansetron (ZOFRAN) IV   Results for orders placed during the hospital encounter of 01/23/12 (from the past 48 hour(s))  PROTIME-INR     Status: Normal   Collection Time   01/23/12  4:45 PM      Component Value Range Comment   Prothrombin Time 13.5  11.6 - 15.2 seconds    INR 1.04  0.00 - 1.49   APTT     Status: Normal   Collection Time   01/23/12  4:45 PM      Component Value Range Comment   aPTT 34  24 - 37 seconds   CBC     Status: Abnormal   Collection Time   01/23/12  4:45 PM      Component Value Range Comment   WBC 12.9 (*) 4.0 - 10.5 K/uL    RBC 4.23  3.87 - 5.11 MIL/uL    Hemoglobin 9.4 (*) 12.0 - 15.0 g/dL    HCT 16.1 (*)  09.6 - 46.0 %    MCV 72.6 (*) 78.0 - 100.0 fL    MCH 22.2 (*)  26.0 - 34.0 pg    MCHC 30.6  30.0 - 36.0 g/dL    RDW 98.1 (*) 19.1 - 15.5 %    Platelets 273  150 - 400 K/uL   DIFFERENTIAL     Status: Abnormal   Collection Time   01/23/12  4:45 PM      Component Value Range Comment   Neutrophils Relative 63  43 - 77 %    Neutro Abs 8.2 (*) 1.7 - 7.7 K/uL    Lymphocytes Relative 30  12 - 46 %    Lymphs Abs 3.9  0.7 - 4.0 K/uL    Monocytes Relative 6  3 - 12 %    Monocytes Absolute 0.7  0.1 - 1.0 K/uL    Eosinophils Relative 1  0 - 5 %    Eosinophils Absolute 0.1  0.0 - 0.7 K/uL    Basophils Relative 0  0 - 1 %    Basophils Absolute 0.0  0.0 - 0.1 K/uL   COMPREHENSIVE METABOLIC PANEL     Status: Abnormal   Collection Time   01/23/12  4:45 PM      Component Value Range Comment   Sodium 135  135 - 145 mEq/L    Potassium 3.4 (*) 3.5 - 5.1 mEq/L    Chloride 99  96 - 112 mEq/L    CO2 25  19 - 32 mEq/L    Glucose, Bld 97  70 - 99 mg/dL    BUN 9  6 - 23 mg/dL    Creatinine, Ser 4.78  0.50 - 1.10 mg/dL    Calcium 9.3  8.4 - 29.5 mg/dL    Total Protein 6.7  6.0 - 8.3 g/dL    Albumin 3.4 (*) 3.5 - 5.2 g/dL    AST 9  0 - 37 U/L    ALT 10  0 - 35 U/L    Alkaline Phosphatase 39  39 - 117 U/L    Total Bilirubin 0.4  0.3 - 1.2 mg/dL    GFR calc non Af Amer >90  >90 mL/min    GFR calc Af Amer >90  >90 mL/min   TROPONIN I     Status: Normal   Collection Time   01/23/12  4:45 PM      Component Value Range Comment   Troponin I <0.30  <0.30 ng/mL   GLUCOSE, CAPILLARY     Status: Normal   Collection Time   01/23/12  4:59 PM      Component Value Range Comment   Glucose-Capillary 94  70 - 99 mg/dL   POCT I-STAT TROPONIN I     Status: Normal   Collection Time   01/23/12  5:00 PM      Component Value Range Comment   Troponin i, poc 0.00  0.00 - 0.08 ng/mL    Comment 3            POCT I-STAT, CHEM 8     Status: Abnormal   Collection Time   01/23/12  5:02 PM      Component Value Range Comment    Sodium 141  135 - 145 mEq/L    Potassium 3.5  3.5 - 5.1 mEq/L    Chloride 105  96 - 112 mEq/L    BUN 8  6 - 23 mg/dL    Creatinine, Ser 6.21  0.50 - 1.10 mg/dL    Glucose, Bld 308 (*) 70 - 99 mg/dL    Calcium, Ion 6.57  8.46 -  1.23 mmol/L    TCO2 22  0 - 100 mmol/L    Hemoglobin 10.2 (*) 12.0 - 15.0 g/dL    HCT 16.1 (*) 09.6 - 46.0 %   URINE RAPID DRUG SCREEN (HOSP PERFORMED)     Status: Abnormal   Collection Time   01/23/12  6:23 PM      Component Value Range Comment   Opiates NONE DETECTED  NONE DETECTED    Cocaine NONE DETECTED  NONE DETECTED    Benzodiazepines NONE DETECTED  NONE DETECTED    Amphetamines NONE DETECTED  NONE DETECTED    Tetrahydrocannabinol POSITIVE (*) NONE DETECTED    Barbiturates NONE DETECTED  NONE DETECTED   GLUCOSE, CAPILLARY     Status: Abnormal   Collection Time   01/23/12  9:57 PM      Component Value Range Comment   Glucose-Capillary 167 (*) 70 - 99 mg/dL   PREGNANCY, URINE     Status: Normal   Collection Time   01/23/12 11:18 PM      Component Value Range Comment   Preg Test, Ur NEGATIVE  NEGATIVE   LIPID PANEL     Status: Abnormal   Collection Time   01/24/12  4:46 AM      Component Value Range Comment   Cholesterol 164  0 - 200 mg/dL    Triglycerides 53  <045 mg/dL    HDL 35 (*) >40 mg/dL    Total CHOL/HDL Ratio 4.7      VLDL 11  0 - 40 mg/dL    LDL Cholesterol 981 (*) 0 - 99 mg/dL   GLUCOSE, CAPILLARY     Status: Abnormal   Collection Time   01/24/12  7:22 AM      Component Value Range Comment   Glucose-Capillary 126 (*) 70 - 99 mg/dL    Comment 1 Notify RN      Comment 2 Documented in Chart       Ct Head Wo Contrast  01/23/2012  *RADIOLOGY REPORT*  Clinical Data: CVA.  CT HEAD WITHOUT CONTRAST  Technique:  Contiguous axial images were obtained from the base of the skull through the vertex without contrast.  Comparison: 11/30/2010.  Findings: The ventricles are normal.  No extra-axial fluid collections are seen.  The brainstem and  cerebellum are unremarkable.  No acute intracranial findings such as infarction or hemorrhage.  No mass lesions.  The bony calvarium is intact. The paranasal sinuses are grossly clear.  Right mastoid and middle ear effusions are noted.  IMPRESSION: No acute intracranial findings or mass lesion. Right mastoid and middle ear effusion.   Original Report Authenticated By: P. Loralie Champagne, M.D.    Mr Brain Wo Contrast  01/23/2012  *RADIOLOGY REPORT*  Clinical Data: right facial numbness.  Right arm numbness.  MRI HEAD WITHOUT CONTRAST  Technique:  Multiplanar, multiecho pulse sequences of the brain and surrounding structures were obtained according to standard protocol without intravenous contrast.  Comparison: Head CT same day  Findings: The brain has a normal appearance on all pulse sequences without evidence of atrophy, old or acute infarction, mass lesion, hemorrhage, hydrocephalus or extra-axial collection.  No pituitary mass.  No skull or skull base lesion. There is some fluid in the right mastoid region and middle ear.  IMPRESSION: Normal MRI of the brain.   Original Report Authenticated By: Thomasenia Sales, M.D.    Mr Cervical Spine Wo Contrast  01/23/2012  *RADIOLOGY REPORT*  Clinical Data: Right arm weakness.  Right  arm numbness.  MRI CERVICAL SPINE WITHOUT CONTRAST  Technique:  Multiplanar and multiecho pulse sequences of the cervical spine, to include the craniocervical junction and cervicothoracic junction, were obtained according to standard protocol without intravenous contrast.  Comparison: None.  Findings: The foramen magnum is widely patent.  There is no abnormality is C1-2 or C2-3.  At C3-4, C4-5, C5-6 and C6-7, there are mild disc bulges but there is no disc herniation or compressive narrowing of the canal or foramina.  C7-T1 is normal.  No abnormal cord signal.  IMPRESSION: Non compressive cervical disc bulges.  No cause of the described symptoms is identified.   Original Report Authenticated  By: Thomasenia Sales, M.D.     Review of Systems  Constitutional: Negative.   HENT: Positive for ear pain and ear discharge.   Eyes: Negative.   Respiratory: Negative.   Cardiovascular: Negative.   Gastrointestinal: Negative.   Genitourinary: Negative.   Musculoskeletal: Negative.   Skin: Negative.   Neurological: Positive for headaches.  Endo/Heme/Allergies: Negative.   Psychiatric/Behavioral: Negative.    Blood pressure 153/69, pulse 97, temperature 98.3 F (36.8 C), temperature source Oral, resp. rate 17, height 5\' 4"  (1.626 m), weight 118.5 kg (261 lb 3.9 oz), last menstrual period 01/17/2012, SpO2 97.00%. Physical Exam  Assessment/Plan: ASSESSMENT/PLAN:  1. Bell's palsy. 2. Likely significant obstructive sleep apnea syndrome. Outpatient sleep study is suggested. 3. Carpal tunnel syndrome on the right. 4. Radicular pain involving the cervical spine on the right side. 5. I agree with high dose steroids for about a week and then taper afterwards. Acyclovir has been written by this has not been shown to be effective we will proceed were for improvement of Bell's palsy. The steroids should also help with the patient's other complaints such as neck pain, radicular symptoms and carpal tunnel syndrome. Will also help her with her low back pain.   SUBJECTIVE: The patient is a 46 year old white female who developed a relatively acute onset of right facial weakness. The symptoms initially started yesterday. She reports reduced hearing and set of hyperacusis. 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 The patient denies focal weakness. She did have some mild headache yesterday. Taste seems unremarkable. She reports a 2 week history of right arm pain. She also has numbness and tingling of the entire palm on the right side especially with use. This also was not present for the last 2 or 3  weeks.  OBJECTIVE: GENERAL: This is a pleasant overweight lady who is in no acute distress.  HEENT: Retro-palatal space is reduced. Tongue base is high. She has ruptured tympanic membrane on the right. No vesicles are seen in the canal. The left canal is also normal although tympanic membrane on the left shows evidence of scarring.  ABDOMEN: soft  EXTREMITIES: No edema. Positive Tinel sign at the wrist on the right side.   BACK: unremarkable.  SKIN: Normal by inspection.    MENTAL STATUS: Alert and oriented. Speech, language and cognition are generally intact. Judgment and insight normal.   CRANIAL NERVES: Pupils are equal, round and reactive to light and accomodation; extra ocular movements are full, there is no significant nystagmus; visual fields are full; tongue is midline; uvula is midline; shoulder elevation is normal. There is complete weakness of the facial muscles on the right side both upper and lower. The left side moves normally.  MOTOR: Normal tone, bulk and strength; no pronator drift.  COORDINATION: Left finger to nose is normal, right finger to nose is normal, No rest tremor; no intention tremor; no postural tremor; no bradykinesia.  REFLEXES: Deep tendon reflexes are symmetrical and normal. Babinski reflexes are flexor bilaterally.   SENSATION: Normal to light touch, temperature, and pinprick.    Beryle Beams, MD Diplomate, American Board of Psychiatry and Neurology (Neurology).  [name] 01/24/2012, 9:12 AM

## 2012-01-24 NOTE — Discharge Summary (Signed)
Physician Discharge Summary  Patient ID: [name] MRN: [id number] DOB/AGE: 1966/01/30 45 y.o.  Admit date: 01/23/2012 Discharge date: 01/24/2012  Discharge Diagnoses:  Principal Problem:  *Bell's palsy Active Problems:  HTN (hypertension)  DM type 2 (diabetes mellitus, type 2)  Carpal tunnel syndrome  Microcytic anemia  Morbid obesity  COPD (chronic obstructive pulmonary disease)     Medication List     As of 01/24/2012  9:36 AM    TAKE these medications         albuterol (5 MG/ML) 0.5% nebulizer solution   Commonly known as: PROVENTIL   Take 2.5 mg by nebulization 4 (four) times daily as needed. For shortness of breath      albuterol 108 (90 BASE) MCG/ACT inhaler   Commonly known as: PROVENTIL HFA;VENTOLIN HFA   Inhale 2 puffs into the lungs every 4 (four) hours as needed for wheezing or shortness of breath. Wheezing/shortnes of breath      citalopram 20 MG tablet   Commonly known as: CELEXA   Take 20 mg by mouth every morning.      fluticasone 50 MCG/ACT nasal spray   Commonly known as: FLONASE   Place 2 sprays into the nose daily.      gabapentin 300 MG capsule   Commonly known as: NEURONTIN   Take 1 capsule (300 mg total) by mouth 3 (three) times daily.      ibuprofen 200 MG tablet   Commonly known as: ADVIL,MOTRIN   Take 200-400 mg by mouth daily as needed. For pain      IRON SUPPLEMENT 325 (65 FE) MG tablet   Generic drug: ferrous sulfate   Take 325 mg by mouth daily with breakfast.      ketorolac 10 MG tablet   Commonly known as: TORADOL   Take 1 tablet (10 mg total) by mouth every 8 (eight) hours as needed for pain.      metFORMIN 500 MG tablet   Commonly known as: GLUCOPHAGE   Take 2 tablets (1,000 mg total) by mouth 2 (two) times daily. For 2 weeks      mometasone-formoterol 100-5 MCG/ACT Aero   Commonly known as: DULERA   Inhale 2 puffs into the lungs 2 (two) times daily.      multivitamin capsule   Take 1 capsule by mouth daily.        olmesartan 20 MG tablet   Commonly known as: BENICAR   Take 20 mg by mouth every morning.      omeprazole 20 MG capsule   Commonly known as: PRILOSEC   Take 20 mg by mouth every morning.      ondansetron 8 MG tablet   Commonly known as: ZOFRAN   Take 1 tablet (8 mg total) by mouth every 8 (eight) hours as needed for nausea.      oxyCODONE-acetaminophen 5-325 MG per tablet   Commonly known as: PERCOCET/ROXICET   Take 1 tablet by mouth 3 (three) times daily as needed for pain.      predniSONE 10 MG tablet   Commonly known as: DELTASONE   6 tablets daily for a week, then decrease by 1 tablet each day until off.      ranitidine 150 MG tablet   Commonly known as: ZANTAC   Take 1 tablet (150 mg total) by mouth 2 (two) times daily.            Discharge Orders    Future Appointments: Provider: Department: Dept Phone: Center:  02/15/2012 11:00 AM [facility] [phone] None   02/15/2012 12:00 PM Nyoka Cowden, MD [facility] [phone] None   03/14/2012 1:30 PM Salley Scarlet, MD [facility] [phone] [facility]     Future Orders Please Complete By Expires   Diet - low sodium heart healthy      Diet Carb Modified      Discharge instructions      Comments:   Call Dr. Jeanice Lim for blood glucose above 300.   Activity as tolerated - No restrictions           Disposition: 01-Home or Self Care  Discharged Condition: stable  Consults: Treatment Team:  Beryle Beams, MD teleneurology Labs:   Results for orders placed during the hospital encounter of 01/23/12 (from the past 48 hour(s))  PROTIME-INR     Status: Normal   Collection Time   01/23/12  4:45 PM      Component Value Range Comment   Prothrombin Time 13.5  11.6 - 15.2 seconds    INR 1.04  0.00 - 1.49   APTT     Status: Normal   Collection Time   01/23/12  4:45 PM      Component Value Range Comment   aPTT 34  24 - 37 seconds   CBC     Status: Abnormal    Collection Time   01/23/12  4:45 PM      Component Value Range Comment   WBC 12.9 (*) 4.0 - 10.5 K/uL    RBC 4.23  3.87 - 5.11 MIL/uL    Hemoglobin 9.4 (*) 12.0 - 15.0 g/dL    HCT 57.8 (*) 46.9 - 46.0 %    MCV 72.6 (*) 78.0 - 100.0 fL    MCH 22.2 (*) 26.0 - 34.0 pg    MCHC 30.6  30.0 - 36.0 g/dL    RDW 62.9 (*) 52.8 - 15.5 %    Platelets 273  150 - 400 K/uL   DIFFERENTIAL     Status: Abnormal   Collection Time   01/23/12  4:45 PM      Component Value Range Comment   Neutrophils Relative 63  43 - 77 %    Neutro Abs 8.2 (*) 1.7 - 7.7 K/uL    Lymphocytes Relative 30  12 - 46 %    Lymphs Abs 3.9  0.7 - 4.0 K/uL    Monocytes Relative 6  3 - 12 %    Monocytes Absolute 0.7  0.1 - 1.0 K/uL    Eosinophils Relative 1  0 - 5 %    Eosinophils Absolute 0.1  0.0 - 0.7 K/uL    Basophils Relative 0  0 - 1 %    Basophils Absolute 0.0  0.0 - 0.1 K/uL   COMPREHENSIVE METABOLIC PANEL     Status: Abnormal   Collection Time   01/23/12  4:45 PM      Component Value Range Comment   Sodium 135  135 - 145 mEq/L    Potassium 3.4 (*) 3.5 - 5.1 mEq/L    Chloride 99  96 - 112 mEq/L    CO2 25  19 - 32 mEq/L    Glucose, Bld 97  70 - 99 mg/dL    BUN 9  6 - 23 mg/dL    Creatinine, Ser 4.13  0.50 - 1.10 mg/dL    Calcium 9.3  8.4 - 24.4 mg/dL    Total Protein 6.7  6.0 - 8.3 g/dL  Albumin 3.4 (*) 3.5 - 5.2 g/dL    AST 9  0 - 37 U/L    ALT 10  0 - 35 U/L    Alkaline Phosphatase 39  39 - 117 U/L    Total Bilirubin 0.4  0.3 - 1.2 mg/dL    GFR calc non Af Amer >90  >90 mL/min    GFR calc Af Amer >90  >90 mL/min   TROPONIN I     Status: Normal   Collection Time   01/23/12  4:45 PM      Component Value Range Comment   Troponin I <0.30  <0.30 ng/mL   GLUCOSE, CAPILLARY     Status: Normal   Collection Time   01/23/12  4:59 PM      Component Value Range Comment   Glucose-Capillary 94  70 - 99 mg/dL   POCT I-STAT TROPONIN I     Status: Normal   Collection Time   01/23/12  5:00 PM      Component Value Range  Comment   Troponin i, poc 0.00  0.00 - 0.08 ng/mL    Comment 3            POCT I-STAT, CHEM 8     Status: Abnormal   Collection Time   01/23/12  5:02 PM      Component Value Range Comment   Sodium 141  135 - 145 mEq/L    Potassium 3.5  3.5 - 5.1 mEq/L    Chloride 105  96 - 112 mEq/L    BUN 8  6 - 23 mg/dL    Creatinine, Ser 1.61  0.50 - 1.10 mg/dL    Glucose, Bld 096 (*) 70 - 99 mg/dL    Calcium, Ion 0.45  4.09 - 1.23 mmol/L    TCO2 22  0 - 100 mmol/L    Hemoglobin 10.2 (*) 12.0 - 15.0 g/dL    HCT 81.1 (*) 91.4 - 46.0 %   URINE RAPID DRUG SCREEN (HOSP PERFORMED)     Status: Abnormal   Collection Time   01/23/12  6:23 PM      Component Value Range Comment   Opiates NONE DETECTED  NONE DETECTED    Cocaine NONE DETECTED  NONE DETECTED    Benzodiazepines NONE DETECTED  NONE DETECTED    Amphetamines NONE DETECTED  NONE DETECTED    Tetrahydrocannabinol POSITIVE (*) NONE DETECTED    Barbiturates NONE DETECTED  NONE DETECTED   GLUCOSE, CAPILLARY     Status: Abnormal   Collection Time   01/23/12  9:57 PM      Component Value Range Comment   Glucose-Capillary 167 (*) 70 - 99 mg/dL   PREGNANCY, URINE     Status: Normal   Collection Time   01/23/12 11:18 PM      Component Value Range Comment   Preg Test, Ur NEGATIVE  NEGATIVE   LIPID PANEL     Status: Abnormal   Collection Time   01/24/12  4:46 AM      Component Value Range Comment   Cholesterol 164  0 - 200 mg/dL    Triglycerides 53  <782 mg/dL    HDL 35 (*) >95 mg/dL    Total CHOL/HDL Ratio 4.7      VLDL 11  0 - 40 mg/dL    LDL Cholesterol 621 (*) 0 - 99 mg/dL   GLUCOSE, CAPILLARY     Status: Abnormal   Collection Time   01/24/12  7:22 AM  Component Value Range Comment   Glucose-Capillary 126 (*) 70 - 99 mg/dL    Comment 1 Notify RN      Comment 2 Documented in Chart       Diagnostics:  Ct Head Wo Contrast  01/23/2012  *RADIOLOGY REPORT*  Clinical Data: CVA.  CT HEAD WITHOUT CONTRAST  Technique:  Contiguous axial images  were obtained from the base of the skull through the vertex without contrast.  Comparison: 11/30/2010.  Findings: The ventricles are normal.  No extra-axial fluid collections are seen.  The brainstem and cerebellum are unremarkable.  No acute intracranial findings such as infarction or hemorrhage.  No mass lesions.  The bony calvarium is intact. The paranasal sinuses are grossly clear.  Right mastoid and middle ear effusions are noted.  IMPRESSION: No acute intracranial findings or mass lesion. Right mastoid and middle ear effusion.   Original Report Authenticated By: P. Loralie Champagne, M.D.    Mr Brain Wo Contrast  01/23/2012  *RADIOLOGY REPORT*  Clinical Data: right facial numbness.  Right arm numbness.  MRI HEAD WITHOUT CONTRAST  Technique:  Multiplanar, multiecho pulse sequences of the brain and surrounding structures were obtained according to standard protocol without intravenous contrast.  Comparison: Head CT same day  Findings: The brain has a normal appearance on all pulse sequences without evidence of atrophy, old or acute infarction, mass lesion, hemorrhage, hydrocephalus or extra-axial collection.  No pituitary mass.  No skull or skull base lesion. There is some fluid in the right mastoid region and middle ear.  IMPRESSION: Normal MRI of the brain.   Original Report Authenticated By: Thomasenia Sales, M.D.    Mr Cervical Spine Wo Contrast  01/23/2012  *RADIOLOGY REPORT*  Clinical Data: Right arm weakness.  Right arm numbness.  MRI CERVICAL SPINE WITHOUT CONTRAST  Technique:  Multiplanar and multiecho pulse sequences of the cervical spine, to include the craniocervical junction and cervicothoracic junction, were obtained according to standard protocol without intravenous contrast.  Comparison: None.  Findings: The foramen magnum is widely patent.  There is no abnormality is C1-2 or C2-3.  At C3-4, C4-5, C5-6 and C6-7, there are mild disc bulges but there is no disc herniation or compressive narrowing  of the canal or foramina.  C7-T1 is normal.  No abnormal cord signal.  IMPRESSION: Non compressive cervical disc bulges.  No cause of the described symptoms is identified.   Original Report Authenticated By: Thomasenia Sales, M.D.     EKG: NSR  Full Code   Hospital Course: See H&P for complete admission details. The patient is a 46 year old white female who presented with right facial droop. She has had Bell's palsy in the past. She also reported some right hand weakness on and off for the past week or so. Therefore a code stroke was called. She had a negative CT brain. Telemetry neurology felt that she most likely had Bell's palsy but recommended observation to rule out TIA. MRI of the brain negative. C-spine MRI showed nothing to explain her symptoms. Neurology was consulted and felt patient had Bell's palsy and carpal tunnel syndrome. He recommends steroids. Stop Valtrex. I recommend patient double up on her metformin while on steroids and monitor blood glucoses. She reports that Dr. Jeanice Lim has called in a wrist splint for her. She also reports that Dr. Sherene Sires plans on doing a polysomnogram in the future. Patient has been cleared for discharge without further workup. No evidence of TIA or stroke.  Discharge Exam:  Blood pressure  153/69, pulse 97, temperature 98.3 F (36.8 C), temperature source Oral, resp. rate 17, height 5\' 4"  (1.626 m), weight 118.5 kg (261 lb 3.9 oz), last menstrual period 01/17/2012, SpO2 97.00%.  Neuro:  Bells palsy remains.  Right hand strength intact.  SignedChristiane Ha 01/24/2012, 9:36 AM

## 2012-01-24 NOTE — Progress Notes (Signed)
UR Chart Review Completed  

## 2012-01-24 NOTE — Evaluation (Signed)
Physical Therapy Evaluation Patient Details Name: [name] MRN: [id number] DOB: [birth year] Today's Date: 01/24/2012 Time: 5009-3818 PT Time Calculation (min): 22 min  PT Assessment / Plan / Recommendation Clinical Impression  No abnormalities found on functional eval.  No PT or DME is needed.    PT Assessment  Patent does not need any further PT services    Follow Up Recommendations  No PT follow up    Barriers to Discharge        Equipment Recommendations  None recommended by PT    Recommendations for Other Services     Frequency      Precautions / Restrictions Precautions Precautions: None Restrictions Weight Bearing Restrictions: No   Pertinent Vitals/Pain       Mobility  Bed Mobility Bed Mobility: Supine to Sit;Sit to Supine Supine to Sit: HOB flat;7: Independent Sit to Supine: 7: Independent Transfers Transfers: Sit to Stand;Stand to Sit Sit to Stand: 7: Independent Stand to Sit: 7: Independent Ambulation/Gait Ambulation/Gait Assistance: 7: Independent Ambulation Distance (Feet): 100 Feet Assistive device: None Gait Pattern: Within Functional Limits Gait velocity: WNL General Gait Details: no abnormality Stairs: No Wheelchair Mobility Wheelchair Mobility: No    Shoulder Instructions     Exercises     PT Diagnosis:    PT Problem List:   PT Treatment Interventions:     PT Goals    Visit Information  Last PT Received On: 01/24/12    Subjective Data  Subjective: the light bothers my eye Patient Stated Goal: wants to go home   Prior Functioning  Home Living Lives With: Son Available Help at Discharge: Family;Available 24 hours/day Type of Home: Mobile home Home Access: Stairs to enter Entrance Stairs-Number of Steps: 6 Entrance Stairs-Rails: Right Home Layout: One level Bathroom Shower/Tub: Engineer, manufacturing systems: Standard Home Adaptive Equipment: None Prior Function Level of Independence: Independent Able to  Take Stairs?: Yes Driving: No Vocation: On disability Communication Communication: No difficulties    Cognition  Overall Cognitive Status: Appears within functional limits for tasks assessed/performed Arousal/Alertness: Awake/alert Orientation Level: Appears intact for tasks assessed Behavior During Session: [facility] for tasks performed    Extremity/Trunk Assessment Right Lower Extremity Assessment RLE ROM/Strength/Tone: Within functional levels RLE Sensation: History of peripheral neuropathy RLE Coordination: WFL - gross motor Left Lower Extremity Assessment LLE ROM/Strength/Tone: Within functional levels LLE Sensation: History of peripheral neuropathy LLE Coordination: WFL - gross motor Trunk Assessment Trunk Assessment: Normal   Balance Balance Balance Assessed: Yes High Level Balance High Level Balance Activites: Side stepping;Backward walking;Direction changes;Turns;Sudden stops;Head turns High Level Balance Comments: no LOB  End of Session PT - End of Session Equipment Utilized During Treatment: Gait belt Activity Tolerance: Patient tolerated treatment well Patient left: in bed;with call bell/phone within reach Nurse Communication: Mobility status  GP     Konrad Penta 01/24/2012, 8:56 AM

## 2012-01-29 LAB — BLOOD GAS, ARTERIAL
Acid-Base Excess: 1.8 mmol/L (ref 0.0–2.0)
Bicarbonate: 27.3 mEq/L — ABNORMAL HIGH (ref 20.0–24.0)
Delivery systems: POSITIVE
Drawn by: 22223
Expiratory PAP: 6
FIO2: 55 %
Inspiratory PAP: 12
O2 Saturation: 99.5 %
TCO2: 26.2 mmol/L (ref 0–100)
pCO2 arterial: 54.4 mmHg — ABNORMAL HIGH (ref 35.0–45.0)
pH, Arterial: 7.321 — ABNORMAL LOW (ref 7.350–7.400)
pO2, Arterial: 146 mmHg — ABNORMAL HIGH (ref 80.0–100.0)

## 2012-01-30 ENCOUNTER — Other Ambulatory Visit: Payer: Self-pay | Admitting: Family Medicine

## 2012-02-08 ENCOUNTER — Encounter: Payer: Self-pay | Admitting: Family Medicine

## 2012-02-08 ENCOUNTER — Ambulatory Visit (INDEPENDENT_AMBULATORY_CARE_PROVIDER_SITE_OTHER): Payer: Medicaid Other | Admitting: Family Medicine

## 2012-02-08 VITALS — BP 128/70 | HR 80 | Resp 15 | Ht 64.0 in | Wt 259.1 lb

## 2012-02-08 DIAGNOSIS — I10 Essential (primary) hypertension: Secondary | ICD-10-CM

## 2012-02-08 DIAGNOSIS — H60399 Other infective otitis externa, unspecified ear: Secondary | ICD-10-CM

## 2012-02-08 DIAGNOSIS — G510 Bell's palsy: Secondary | ICD-10-CM

## 2012-02-08 DIAGNOSIS — B370 Candidal stomatitis: Secondary | ICD-10-CM

## 2012-02-08 DIAGNOSIS — H6090 Unspecified otitis externa, unspecified ear: Secondary | ICD-10-CM

## 2012-02-08 MED ORDER — VALSARTAN 80 MG PO TABS: 80.0000 mg | ORAL_TABLET | Freq: Every day | ORAL | Status: DC

## 2012-02-08 MED ORDER — NEOMYCIN-POLYMYXIN-HC 3.5-10000-1 OT SOLN: 3.0000 [drp] | Freq: Four times a day (QID) | OTIC | Status: DC

## 2012-02-08 MED ORDER — OXYCODONE-ACETAMINOPHEN 5-325 MG PO TABS: 1.0000 | ORAL_TABLET | Freq: Three times a day (TID) | ORAL | Status: DC | PRN

## 2012-02-08 MED ORDER — NYSTATIN 100000 UNIT/ML MT SUSP: 500000.0000 [IU] | Freq: Four times a day (QID) | OROMUCOSAL | Status: DC

## 2012-02-08 NOTE — Patient Instructions (Addendum)
Keep previous f/u appt  Use ear drop  Pain medications refilled New blood pressure pill Diovan -covered by [employer]

## 2012-02-08 NOTE — Progress Notes (Signed)
  Subjective:    Patient ID: [name], female    DOB: 28-Dec-1965, 46 y.o.   MRN: [id number]  HPI Patient here to followup hospital admission. She was admitted on October 1 for TIA symptoms this is found to be Bell's palsy. She negative MRI and was evaluated by neurology. She was sent home on high-dose there with a taper. She continues to have symptoms of her Bell's palsy on her right side of her face she is unable to smile. She's unable to close eye completely and has been using artificial tears. She has significant pain in her right ear which is been bothering her for the past 2 weeks. Start to drain last week. Refill needed on pain medications she is status post ablation for her dysfunctional uterine bleeding she is also due to have her teeth removed by her oral surgeon and has followup with pulmonary at the end of the month for her COPD. Of note her Benicar was not approved by her medicaid therefore she's been out of her blood pressure medication for the past 2 weeks.   Review of Systems - per above  GEN- + fatigue, fever, weight loss,weakness, recent illness HEENT- denies eye drainage, change in vision, nasal discharge,+ear pain CVS- denies chest pain, palpitations RESP- denies SOB, cough, wheeze ABD- denies N/V, change in stools, abd pain GU- denies dysuria, hematuria, dribbling, incontinence MSK- + joint pain, muscle aches, injury Neuro- denies headache, dizziness, syncope, seizure activity      Objective:   Physical Exam GEN- NAD, alert and oriented x3, obese  HEENT- PERRL, EOMI, non injected sclera, pink conjunctiva, MMM, oropharynx +thrush, Right perforated TM- no acute discharge but inflammed TM, canal inflammed, Left TM clear no effusion, canal clear  Neck- Supple, no LAD CVS- RRR, no murmur RESP-no wheeze, normal WOB, no rhonchi , EXT- trace peda  edema Pulses- Radial, DP- 2+ Neuro- assymetric smile, tongue midline, other cranial nerves in tact, decreased sensation  of right side of face         Assessment & Plan:

## 2012-02-11 DIAGNOSIS — H6090 Unspecified otitis externa, unspecified ear: Secondary | ICD-10-CM | POA: Insufficient documentation

## 2012-02-11 NOTE — Assessment & Plan Note (Signed)
Unchanged, s/p high dose steroids. Advised eye patch to avoid corneal ulcer/erosions

## 2012-02-11 NOTE — Assessment & Plan Note (Signed)
Nystatin swish and swallow

## 2012-02-11 NOTE — Assessment & Plan Note (Signed)
Antibiotic ear drop as directed

## 2012-02-11 NOTE — Assessment & Plan Note (Signed)
Benicar not covered, trial of Diovan

## 2012-02-13 ENCOUNTER — Inpatient Hospital Stay (HOSPITAL_COMMUNITY)
Admission: EM | Admit: 2012-02-13 | Discharge: 2012-02-20 | DRG: 190 | Disposition: A | Discharge status: Home Health | Payer: Medicaid Other | Attending: Internal Medicine | Admitting: Internal Medicine

## 2012-02-13 ENCOUNTER — Emergency Department (HOSPITAL_COMMUNITY): Payer: Medicaid Other

## 2012-02-13 ENCOUNTER — Encounter (HOSPITAL_COMMUNITY): Payer: Self-pay

## 2012-02-13 DIAGNOSIS — R0789 Other chest pain: Secondary | ICD-10-CM | POA: Diagnosis present

## 2012-02-13 DIAGNOSIS — G510 Bell's palsy: Secondary | ICD-10-CM | POA: Diagnosis present

## 2012-02-13 DIAGNOSIS — M549 Dorsalgia, unspecified: Secondary | ICD-10-CM | POA: Diagnosis present

## 2012-02-13 DIAGNOSIS — E871 Hypo-osmolality and hyponatremia: Secondary | ICD-10-CM | POA: Diagnosis present

## 2012-02-13 DIAGNOSIS — F17200 Nicotine dependence, unspecified, uncomplicated: Secondary | ICD-10-CM | POA: Diagnosis present

## 2012-02-13 DIAGNOSIS — E876 Hypokalemia: Secondary | ICD-10-CM | POA: Diagnosis present

## 2012-02-13 DIAGNOSIS — G8929 Other chronic pain: Secondary | ICD-10-CM | POA: Diagnosis present

## 2012-02-13 DIAGNOSIS — Z79899 Other long term (current) drug therapy: Secondary | ICD-10-CM

## 2012-02-13 DIAGNOSIS — F17210 Nicotine dependence, cigarettes, uncomplicated: Secondary | ICD-10-CM | POA: Diagnosis present

## 2012-02-13 DIAGNOSIS — R0902 Hypoxemia: Secondary | ICD-10-CM

## 2012-02-13 DIAGNOSIS — Z8249 Family history of ischemic heart disease and other diseases of the circulatory system: Secondary | ICD-10-CM

## 2012-02-13 DIAGNOSIS — K219 Gastro-esophageal reflux disease without esophagitis: Secondary | ICD-10-CM | POA: Diagnosis present

## 2012-02-13 DIAGNOSIS — Z885 Allergy status to narcotic agent status: Secondary | ICD-10-CM

## 2012-02-13 DIAGNOSIS — Z720 Tobacco use: Secondary | ICD-10-CM

## 2012-02-13 DIAGNOSIS — IMO0002 Reserved for concepts with insufficient information to code with codable children: Secondary | ICD-10-CM

## 2012-02-13 DIAGNOSIS — E1159 Type 2 diabetes mellitus with other circulatory complications: Secondary | ICD-10-CM | POA: Diagnosis present

## 2012-02-13 DIAGNOSIS — I10 Essential (primary) hypertension: Secondary | ICD-10-CM | POA: Diagnosis present

## 2012-02-13 DIAGNOSIS — IMO0001 Reserved for inherently not codable concepts without codable children: Secondary | ICD-10-CM | POA: Diagnosis present

## 2012-02-13 DIAGNOSIS — F32A Depression, unspecified: Secondary | ICD-10-CM | POA: Diagnosis present

## 2012-02-13 DIAGNOSIS — D72829 Elevated white blood cell count, unspecified: Secondary | ICD-10-CM | POA: Diagnosis not present

## 2012-02-13 DIAGNOSIS — J9620 Acute and chronic respiratory failure, unspecified whether with hypoxia or hypercapnia: Secondary | ICD-10-CM | POA: Diagnosis present

## 2012-02-13 DIAGNOSIS — T380X5A Adverse effect of glucocorticoids and synthetic analogues, initial encounter: Secondary | ICD-10-CM | POA: Diagnosis not present

## 2012-02-13 DIAGNOSIS — Z8719 Personal history of other diseases of the digestive system: Secondary | ICD-10-CM

## 2012-02-13 DIAGNOSIS — Z9981 Dependence on supplemental oxygen: Secondary | ICD-10-CM

## 2012-02-13 DIAGNOSIS — D649 Anemia, unspecified: Secondary | ICD-10-CM | POA: Diagnosis present

## 2012-02-13 DIAGNOSIS — E1142 Type 2 diabetes mellitus with diabetic polyneuropathy: Secondary | ICD-10-CM | POA: Diagnosis present

## 2012-02-13 DIAGNOSIS — E119 Type 2 diabetes mellitus without complications: Secondary | ICD-10-CM | POA: Diagnosis present

## 2012-02-13 DIAGNOSIS — F411 Generalized anxiety disorder: Secondary | ICD-10-CM | POA: Diagnosis present

## 2012-02-13 DIAGNOSIS — F3289 Other specified depressive episodes: Secondary | ICD-10-CM | POA: Diagnosis present

## 2012-02-13 DIAGNOSIS — Z6841 Body Mass Index (BMI) 40.0 and over, adult: Secondary | ICD-10-CM

## 2012-02-13 DIAGNOSIS — Z888 Allergy status to other drugs, medicaments and biological substances status: Secondary | ICD-10-CM

## 2012-02-13 DIAGNOSIS — F329 Major depressive disorder, single episode, unspecified: Secondary | ICD-10-CM | POA: Diagnosis present

## 2012-02-13 DIAGNOSIS — J441 Chronic obstructive pulmonary disease with (acute) exacerbation: Principal | ICD-10-CM | POA: Diagnosis present

## 2012-02-13 DIAGNOSIS — Z9089 Acquired absence of other organs: Secondary | ICD-10-CM

## 2012-02-13 DIAGNOSIS — D509 Iron deficiency anemia, unspecified: Secondary | ICD-10-CM | POA: Diagnosis present

## 2012-02-13 DIAGNOSIS — M545 Low back pain, unspecified: Secondary | ICD-10-CM | POA: Diagnosis present

## 2012-02-13 DIAGNOSIS — Z23 Encounter for immunization: Secondary | ICD-10-CM

## 2012-02-13 LAB — CBC
HCT: 37 % (ref 36.0–46.0)
Hemoglobin: 11.1 g/dL — ABNORMAL LOW (ref 12.0–15.0)
MCH: 21.9 pg — ABNORMAL LOW (ref 26.0–34.0)
MCHC: 30 g/dL (ref 30.0–36.0)
MCV: 73 fL — ABNORMAL LOW (ref 78.0–100.0)
Platelets: 285 10*3/uL (ref 150–400)
RBC: 5.07 MIL/uL (ref 3.87–5.11)
RDW: 18.1 % — ABNORMAL HIGH (ref 11.5–15.5)
WBC: 12.9 10*3/uL — ABNORMAL HIGH (ref 4.0–10.5)

## 2012-02-13 LAB — URINE MICROSCOPIC-ADD ON

## 2012-02-13 LAB — HEPATIC FUNCTION PANEL
ALT: 13 U/L (ref 0–35)
AST: 12 U/L (ref 0–37)
Albumin: 3.5 g/dL (ref 3.5–5.2)
Alkaline Phosphatase: 41 U/L (ref 39–117)
Bilirubin, Direct: 0.1 mg/dL (ref 0.0–0.3)
Indirect Bilirubin: 0.5 mg/dL (ref 0.3–0.9)
Total Bilirubin: 0.6 mg/dL (ref 0.3–1.2)
Total Protein: 7.2 g/dL (ref 6.0–8.3)

## 2012-02-13 LAB — URINALYSIS, ROUTINE W REFLEX MICROSCOPIC
Bilirubin Urine: NEGATIVE
Glucose, UA: NEGATIVE mg/dL
Ketones, ur: NEGATIVE mg/dL
Leukocytes, UA: NEGATIVE
Nitrite: NEGATIVE
Protein, ur: NEGATIVE mg/dL
Specific Gravity, Urine: 1.01 (ref 1.005–1.030)
Urobilinogen, UA: 0.2 mg/dL (ref 0.0–1.0)
pH: 6 (ref 5.0–8.0)

## 2012-02-13 LAB — BASIC METABOLIC PANEL
BUN: 8 mg/dL (ref 6–23)
CO2: 25 mEq/L (ref 19–32)
Calcium: 9.2 mg/dL (ref 8.4–10.5)
Chloride: 92 mEq/L — ABNORMAL LOW (ref 96–112)
Creatinine, Ser: 0.74 mg/dL (ref 0.50–1.10)
GFR calc Af Amer: >90 mL/min (ref >90)
GFR calc non Af Amer: >90 mL/min (ref >90)
Glucose, Bld: 202 mg/dL — ABNORMAL HIGH (ref 70–99)
Potassium: 3.3 mEq/L — ABNORMAL LOW (ref 3.5–5.1)
Sodium: 131 mEq/L — ABNORMAL LOW (ref 135–145)

## 2012-02-13 LAB — BLOOD GAS, ARTERIAL
Acid-Base Excess: 0.3 mmol/L (ref 0.0–2.0)
Bicarbonate: 24.4 mEq/L — ABNORMAL HIGH (ref 20.0–24.0)
O2 Content: 6 L/min
O2 Saturation: 90.6 %
Patient temperature: 37
TCO2: 22.6 mmol/L (ref 0–100)
pCO2 arterial: 39.5 mmHg (ref 35.0–45.0)
pH, Arterial: 7.408 (ref 7.350–7.450)
pO2, Arterial: 62.8 mmHg — ABNORMAL LOW (ref 80.0–100.0)

## 2012-02-13 LAB — MRSA PCR SCREENING: MRSA by PCR: NEGATIVE

## 2012-02-13 LAB — GLUCOSE, CAPILLARY: Glucose-Capillary: 227 mg/dL — ABNORMAL HIGH (ref 70–99)

## 2012-02-13 LAB — TROPONIN I: Troponin I: <0.3 ng/mL (ref <0.30)

## 2012-02-13 LAB — MAGNESIUM: Magnesium: 1.9 mg/dL (ref 1.5–2.5)

## 2012-02-13 MED ORDER — DEXTROSE 5 % IV SOLN
500.0000 mg | INTRAVENOUS | Status: DC
  Administered 2012-02-14 – 2012-02-18 (×5): 500 mg via INTRAVENOUS
  Filled 2012-02-13 (×6): qty 500

## 2012-02-13 MED ORDER — IRBESARTAN 75 MG PO TABS
75.0000 mg | ORAL_TABLET | Freq: Every day | ORAL | Status: DC
  Administered 2012-02-13 – 2012-02-20 (×8): 75 mg via ORAL
  Filled 2012-02-13 (×8): qty 1

## 2012-02-13 MED ORDER — GABAPENTIN 300 MG PO CAPS
300.0000 mg | ORAL_CAPSULE | Freq: Three times a day (TID) | ORAL | Status: DC
  Administered 2012-02-13 – 2012-02-20 (×20): 300 mg via ORAL
  Filled 2012-02-13 (×21): qty 1

## 2012-02-13 MED ORDER — MOMETASONE FURO-FORMOTEROL FUM 100-5 MCG/ACT IN AERO
INHALATION_SPRAY | RESPIRATORY_TRACT | Status: AC
  Filled 2012-02-13: qty 8.8

## 2012-02-13 MED ORDER — ONDANSETRON HCL 4 MG/2ML IJ SOLN: 4.0000 mg | INTRAMUSCULAR | Status: DC | PRN

## 2012-02-13 MED ORDER — SODIUM CHLORIDE 0.9 % IJ SOLN
3.0000 mL | Freq: Two times a day (BID) | INTRAMUSCULAR | Status: DC
  Administered 2012-02-13 – 2012-02-20 (×7): 3 mL via INTRAVENOUS
  Filled 2012-02-13 (×8): qty 3

## 2012-02-13 MED ORDER — IPRATROPIUM BROMIDE 0.02 % IN SOLN
0.5000 mg | Freq: Once | RESPIRATORY_TRACT | Status: AC
Start: 2012-02-13 — End: 2012-02-13
  Administered 2012-02-13: 0.5 mg via RESPIRATORY_TRACT
  Filled 2012-02-13: qty 2.5

## 2012-02-13 MED ORDER — ALBUTEROL SULFATE (5 MG/ML) 0.5% IN NEBU
2.5000 mg | INHALATION_SOLUTION | RESPIRATORY_TRACT | Status: DC | PRN
Start: 2012-02-13 — End: 2012-02-20
  Administered 2012-02-19 – 2012-02-20 (×2): 2.5 mg via RESPIRATORY_TRACT
  Filled 2012-02-13: qty 0.5

## 2012-02-13 MED ORDER — TRAZODONE HCL 50 MG PO TABS
25.0000 mg | ORAL_TABLET | Freq: Every evening | ORAL | Status: DC | PRN
  Administered 2012-02-14: 25 mg via ORAL
  Filled 2012-02-13: qty 1

## 2012-02-13 MED ORDER — FLUTICASONE PROPIONATE 50 MCG/ACT NA SUSP
2.0000 | Freq: Every day | NASAL | Status: DC
  Administered 2012-02-14 – 2012-02-20 (×7): 2 via NASAL
  Filled 2012-02-13: qty 16

## 2012-02-13 MED ORDER — BISACODYL 5 MG PO TBEC: 5.0000 mg | DELAYED_RELEASE_TABLET | Freq: Every day | ORAL | Status: DC | PRN

## 2012-02-13 MED ORDER — NYSTATIN 100000 UNIT/ML MT SUSP
500000.0000 [IU] | Freq: Four times a day (QID) | OROMUCOSAL | Status: DC
  Administered 2012-02-13 – 2012-02-20 (×25): 500000 [IU] via ORAL
  Filled 2012-02-13 (×25): qty 5

## 2012-02-13 MED ORDER — INSULIN ASPART 100 UNIT/ML ~~LOC~~ SOLN
0.0000 [IU] | Freq: Three times a day (TID) | SUBCUTANEOUS | Status: DC
  Administered 2012-02-14: 5 [IU] via SUBCUTANEOUS

## 2012-02-13 MED ORDER — METHYLPREDNISOLONE SODIUM SUCC 125 MG IJ SOLR
125.0000 mg | Freq: Four times a day (QID) | INTRAMUSCULAR | Status: DC
  Administered 2012-02-13 – 2012-02-14 (×2): 125 mg via INTRAVENOUS
  Filled 2012-02-13 (×2): qty 2

## 2012-02-13 MED ORDER — INSULIN ASPART 100 UNIT/ML ~~LOC~~ SOLN
0.0000 [IU] | Freq: Every day | SUBCUTANEOUS | Status: DC
  Administered 2012-02-13: 2 [IU] via SUBCUTANEOUS

## 2012-02-13 MED ORDER — ALBUTEROL SULFATE (5 MG/ML) 0.5% IN NEBU
5.0000 mg | INHALATION_SOLUTION | Freq: Once | RESPIRATORY_TRACT | Status: AC
  Administered 2012-02-13: 5 mg via RESPIRATORY_TRACT
  Filled 2012-02-13: qty 1

## 2012-02-13 MED ORDER — SODIUM CHLORIDE 0.9 % IV BOLUS (SEPSIS)
500.0000 mL | Freq: Once | INTRAVENOUS | Status: AC
  Administered 2012-02-13: 500 mL via INTRAVENOUS

## 2012-02-13 MED ORDER — DOCUSATE SODIUM 100 MG PO CAPS
100.0000 mg | ORAL_CAPSULE | Freq: Two times a day (BID) | ORAL | Status: DC
  Administered 2012-02-13 – 2012-02-20 (×13): 100 mg via ORAL
  Filled 2012-02-13 (×13): qty 1

## 2012-02-13 MED ORDER — DEXTROSE 5 % IV SOLN
INTRAVENOUS | Status: AC
  Filled 2012-02-13: qty 500

## 2012-02-13 MED ORDER — PNEUMOCOCCAL VAC POLYVALENT 25 MCG/0.5ML IJ INJ
0.5000 mL | INJECTION | INTRAMUSCULAR | Status: AC
  Filled 2012-02-13: qty 0.5

## 2012-02-13 MED ORDER — ACETAMINOPHEN 325 MG PO TABS: 650.0000 mg | ORAL_TABLET | ORAL | Status: DC | PRN

## 2012-02-13 MED ORDER — IPRATROPIUM BROMIDE 0.02 % IN SOLN
0.5000 mg | RESPIRATORY_TRACT | Status: DC
  Administered 2012-02-13 – 2012-02-20 (×35): 0.5 mg via RESPIRATORY_TRACT
  Filled 2012-02-13 (×34): qty 2.5

## 2012-02-13 MED ORDER — ALBUTEROL SULFATE (5 MG/ML) 0.5% IN NEBU
10.0000 mg | INHALATION_SOLUTION | Freq: Once | RESPIRATORY_TRACT | Status: AC
  Administered 2012-02-13: 10 mg via RESPIRATORY_TRACT
  Filled 2012-02-13: qty 2

## 2012-02-13 MED ORDER — PREDNISONE 20 MG PO TABS
40.0000 mg | ORAL_TABLET | Freq: Once | ORAL | Status: AC
  Administered 2012-02-13: 40 mg via ORAL
  Filled 2012-02-13: qty 2

## 2012-02-13 MED ORDER — FLEET ENEMA 7-19 GM/118ML RE ENEM: 1.0000 | ENEMA | Freq: Once | RECTAL | Status: AC | PRN

## 2012-02-13 MED ORDER — PANTOPRAZOLE SODIUM 40 MG PO TBEC
40.0000 mg | DELAYED_RELEASE_TABLET | Freq: Every day | ORAL | Status: DC
  Administered 2012-02-13 – 2012-02-20 (×8): 40 mg via ORAL
  Filled 2012-02-13 (×8): qty 1

## 2012-02-13 MED ORDER — GUAIFENESIN-DM 100-10 MG/5ML PO SYRP
5.0000 mL | ORAL_SOLUTION | ORAL | Status: DC | PRN
  Administered 2012-02-14 – 2012-02-20 (×11): 5 mL via ORAL
  Filled 2012-02-13 (×12): qty 5

## 2012-02-13 MED ORDER — ENOXAPARIN SODIUM 40 MG/0.4ML ~~LOC~~ SOLN
40.0000 mg | SUBCUTANEOUS | Status: DC
  Administered 2012-02-13 – 2012-02-19 (×7): 40 mg via SUBCUTANEOUS
  Filled 2012-02-13 (×7): qty 0.4

## 2012-02-13 MED ORDER — NICOTINE 21 MG/24HR TD PT24
21.0000 mg | MEDICATED_PATCH | Freq: Every day | TRANSDERMAL | Status: DC
  Administered 2012-02-13 – 2012-02-20 (×8): 21 mg via TRANSDERMAL
  Filled 2012-02-13 (×8): qty 1

## 2012-02-13 MED ORDER — MOMETASONE FURO-FORMOTEROL FUM 100-5 MCG/ACT IN AERO
2.0000 | INHALATION_SPRAY | Freq: Two times a day (BID) | RESPIRATORY_TRACT | Status: DC
  Administered 2012-02-14 – 2012-02-20 (×13): 2 via RESPIRATORY_TRACT
  Filled 2012-02-13: qty 8.8

## 2012-02-13 MED ORDER — POTASSIUM CHLORIDE IN NACL 20-0.9 MEQ/L-% IV SOLN
INTRAVENOUS | Status: DC
  Administered 2012-02-13: 1000 mL via INTRAVENOUS
  Administered 2012-02-14: 50 mL/h via INTRAVENOUS
  Administered 2012-02-15 – 2012-02-16 (×3): via INTRAVENOUS
  Administered 2012-02-17: 20 mL/h via INTRAVENOUS

## 2012-02-13 MED ORDER — ALBUTEROL SULFATE (5 MG/ML) 0.5% IN NEBU
2.5000 mg | INHALATION_SOLUTION | RESPIRATORY_TRACT | Status: DC
  Administered 2012-02-13 – 2012-02-20 (×35): 2.5 mg via RESPIRATORY_TRACT
  Filled 2012-02-13 (×37): qty 0.5

## 2012-02-13 MED ORDER — CITALOPRAM HYDROBROMIDE 20 MG PO TABS
20.0000 mg | ORAL_TABLET | Freq: Every morning | ORAL | Status: DC
  Administered 2012-02-14 – 2012-02-20 (×7): 20 mg via ORAL
  Filled 2012-02-13 (×8): qty 1

## 2012-02-13 MED ORDER — LEVOFLOXACIN 500 MG PO TABS
500.0000 mg | ORAL_TABLET | Freq: Once | ORAL | Status: AC
  Administered 2012-02-13: 500 mg via ORAL
  Filled 2012-02-13: qty 1

## 2012-02-13 MED ORDER — METFORMIN HCL 500 MG PO TABS
1000.0000 mg | ORAL_TABLET | Freq: Two times a day (BID) | ORAL | Status: DC
  Administered 2012-02-14 – 2012-02-17 (×7): 1000 mg via ORAL
  Filled 2012-02-13 (×7): qty 2

## 2012-02-13 MED ORDER — HYDROMORPHONE HCL PF 1 MG/ML IJ SOLN
0.5000 mg | INTRAMUSCULAR | Status: DC | PRN
  Administered 2012-02-13 – 2012-02-19 (×22): 0.5 mg via INTRAVENOUS
  Filled 2012-02-13 (×22): qty 1

## 2012-02-13 MED ORDER — OXYCODONE HCL 5 MG PO TABS
5.0000 mg | ORAL_TABLET | ORAL | Status: DC | PRN
  Administered 2012-02-13 – 2012-02-20 (×30): 5 mg via ORAL
  Filled 2012-02-13 (×32): qty 1

## 2012-02-13 NOTE — ED Provider Notes (Addendum)
History    46 year old female with shortness of breath. Onset yesterday and progressively worsening. Cough which is occasionally productive for whitish sputum. CP in sternal area when coughs. Does not radiate. Patient has a history of COPD and has home O2 requirement of 2.5 L. Denies hx of blood clot. No unusual leg pain or swelling. No n/v. Subjective fever. PCP: [facility].  CSN: [id number]  Arrival date & time 02/13/12  1417   First MD Initiated Contact with Patient 02/13/12 1441      Chief Complaint  Patient presents with  . Shortness of Breath    (Consider location/radiation/quality/duration/timing/severity/associated sxs/prior treatment) The history is provided by the patient. No language interpreter was used.    Past Medical History  Diagnosis Date  . Diabetes mellitus   . COPD (chronic obstructive pulmonary disease)   . HTN (hypertension)   . Low back pain   . Tachycardia     never had test done since no insurance  . Depression   . Asthma   . Shortness of breath   . Anxiety   . Gastric erosions     EGD 08/2010.  . Internal hemorrhoids     Colonoscopy 5/12.  [name] Heavy menses   . Chronic respiratory failure with hypoxia     On 2-3 L of oxygen at home  . GERD (gastroesophageal reflux disease)   . Anemia     Past Surgical History  Procedure Date  . Cholecystectomy 1990  . Cesarean section     twice  . Kidney surgery     as child for blockages  . Tubal ligation   . Tonsillectomy   . Wrist surgery 1995    Lt wrist  . Tympanostomy tube placement   . Hysteroscopy with thermachoice 01/17/2012    Procedure: HYSTEROSCOPY WITH THERMACHOICE;  Surgeon: Lazaro Arms, MD;  Location: [facility];  Service: Gynecology;  Laterality: N/A;  total therapy time: 9:13sec  D5W  18 ml in, D5W   18ml out, temperture 87degrees celcious  . Uterine ablation     Family History  Problem Relation Age of Onset  . Heart attack Father 60    deceased, etoh use  . Heart disease Father   .  Heart attack Mother 62    deceased  . Diabetes Mother   . Breast cancer Mother   . Heart failure Mother     oxygen dependence, nonsmoker  . Heart disease Mother   . Depression Mother   . Cancer Mother   . Colon cancer Neg Hx   . Liver disease Maternal Aunt 40    died while on liver transplant list  . Heart attack Maternal Grandmother     premature CAD  . Ulcers Sister   . Hypertension Sister     History  Substance Use Topics  . Smoking status: Current Every Day Smoker -- 1.0 packs/day for 30 years    Types: Cigarettes  . Smokeless tobacco: Never Used  . Alcohol Use: Yes     social use    OB History    Grav Para Term Preterm Abortions TAB SAB Ect Mult Living                  Review of Systems   Review of symptoms negative unless otherwise noted in HPI.   Allergies  Codeine and Wellbutrin  Home Medications   Current Outpatient Rx  Name Route Sig Dispense Refill  . ALBUTEROL SULFATE HFA 108 (90 BASE) MCG/ACT IN AERS Inhalation  Inhale 2 puffs into the lungs every 4 (four) hours as needed for wheezing or shortness of breath. Wheezing/shortnes of breath 1 Inhaler 3  . ALBUTEROL SULFATE (5 MG/ML) 0.5% IN NEBU Nebulization Take 2.5 mg by nebulization 4 (four) times daily as needed. For shortness of breath    . CITALOPRAM HYDROBROMIDE 20 MG PO TABS Oral Take 20 mg by mouth every morning.    [name] DEXTROMETHORPHAN HBR 15 MG/5ML PO SYRP Oral Take 10 mLs by mouth 4 (four) times daily as needed. Cold Symptoms    . FERROUS SULFATE 325 (65 FE) MG PO TABS Oral Take 325 mg by mouth daily with breakfast.    . FLUTICASONE PROPIONATE 50 MCG/ACT NA SUSP Nasal Place 2 sprays into the nose daily. 16 g 3  . GABAPENTIN 300 MG PO CAPS Oral Take 1 capsule (300 mg total) by mouth 3 (three) times daily. 90 capsule 2  . IBUPROFEN 200 MG PO TABS Oral Take 200-400 mg by mouth daily as needed. For pain     . METFORMIN HCL 500 MG PO TABS Oral Take 500 mg by mouth 2 (two) times daily.    . MOMETASONE  FURO-FORMOTEROL FUM 100-5 MCG/ACT IN AERO Inhalation Inhale 2 puffs into the lungs 2 (two) times daily. 1 Inhaler 0  . MULTIVITAMINS PO CAPS Oral Take 1 capsule by mouth daily.    . NEOMYCIN-POLYMYXIN-HC 3.5-10000-1 OT SOLN Right Ear Place 3 drops into the right ear 4 (four) times daily. For 1 week 10 mL 0  . NYSTATIN 100000 UNIT/ML MT SUSP Oral Take 5 mLs (500,000 Units total) by mouth 4 (four) times daily. 120 mL 1  . OMEPRAZOLE 20 MG PO CPDR Oral Take 20 mg by mouth every morning.    [name] ONDANSETRON HCL 8 MG PO TABS Oral Take 1 tablet (8 mg total) by mouth every 8 (eight) hours as needed for nausea. 10 tablet 0  . OXYCODONE-ACETAMINOPHEN 5-325 MG PO TABS Oral Take 1 tablet by mouth 3 (three) times daily as needed for pain. 90 tablet 0  . PREDNISONE 10 MG PO TABS Oral Take 10-60 mg by mouth. 6 tablets daily for a week, then decrease by 1 tablet each day until off.    [name] VALSARTAN 80 MG PO TABS Oral Take 1 tablet (80 mg total) by mouth daily. 30 tablet 6    BP 98/46  Pulse 106  Temp 98 F (36.7 C) (Oral)  Resp 20  Ht 5\' 4"  (1.626 m)  Wt 259 lb (117.482 kg)  BMI 44.46 kg/m2  SpO2 89%  LMP 01/17/2012  Physical Exam  Nursing note and vitals reviewed. Constitutional:       Sitting in bed. Tired appearing but not toxic. Obese.  HENT:  Head: Normocephalic and atraumatic.  Eyes: Conjunctivae normal are normal. Right eye exhibits no discharge. Left eye exhibits no discharge.  Neck: Neck supple.  Cardiovascular: Normal rate, regular rhythm and normal heart sounds.  Exam reveals no gallop and no friction rub.   No murmur heard. Pulmonary/Chest: She has wheezes. She exhibits no tenderness.       Wheezing bilaterally. Prolonged expiration.  Abdominal: Soft. She exhibits no distension. There is no tenderness.  Musculoskeletal: She exhibits no edema and no tenderness.       Lower extremities symmetric as compared to each other. No calf tenderness. Negative Homan's. No palpable cords.     Neurological: She is alert.  Skin: Skin is warm and dry.  Psychiatric: She has a normal mood  and affect. Her behavior is normal. Thought content normal.    ED Course  Procedures (including critical care time)  CRITICAL CARE Performed by: Raeford Razor   Total critical care time: 35 minutes  Critical care time was exclusive of separately billable procedures and treating other patients.  Critical care was necessary to treat or prevent imminent or life-threatening deterioration.  Critical care was time spent personally by me on the following activities: development of treatment plan with patient and/or surrogate as well as nursing, discussions with consultants, evaluation of patient's response to treatment, examination of patient, obtaining history from patient or surrogate, ordering and performing treatments and interventions, ordering and review of laboratory studies, ordering and review of radiographic studies, pulse oximetry and re-evaluation of patient's condition.   Labs Reviewed  CBC - Abnormal; Notable for the following:    WBC 12.9 (*)     Hemoglobin 11.1 (*)     MCV 73.0 (*)     MCH 21.9 (*)     RDW 18.1 (*)     All other components within normal limits  BASIC METABOLIC PANEL - Abnormal; Notable for the following:    Sodium 131 (*)     Potassium 3.3 (*)     Chloride 92 (*)     Glucose, Bld 202 (*)     All other components within normal limits  TROPONIN I   Dg Chest 2 View  02/13/2012  *RADIOLOGY REPORT*  Clinical Data: Chest pain.  CHEST - 2 VIEW  Comparison: October 19, 2011.  Findings: Cardiomediastinal silhouette appears normal. Peribronchial thickening is seen bilaterally which is unchanged compared to prior exam.  No acute pulmonary disease is noted.  Bony thorax is intact.  IMPRESSION: No acute cardiopulmonary abnormality seen.   Original Report Authenticated By: Venita Sheffield., M.D.    EKG:  Rhythm: Sinus tachycardia Rate: 106 Axis: Normal  Intervals:  Normal ST segments: Nonspecific ST changes. There is T-wave flattening inferiorly and laterally    1. COPD exacerbation   2. Hypoxemia       MDM  5:40 PM Pt now actually more hypoxic after hour long neb. o2 increased to 5L. Will have to admit.  46 year old female with shortness of breath and chest pain. Suspect COPD exacerbation. Patient received multiple rounds of nebulized medications in the emergency room. She received a dose of steroids. She did receive a dose of Levaquin. Patient oxygen saturation actually declining with therapy. Will admit for COPD exacerbation. Chest pain is very atypical for ACS and doubt although EKG does have some flattening laterally which is new from comparison. Pulmonary embolism considered but doubt as well.     Raeford Razor, MD 02/13/12 1749  Raeford Razor, MD 02/13/12 1749  Raeford Razor, MD 02/13/12 2106

## 2012-02-13 NOTE — ED Notes (Signed)
Pt also c/o chest pain that gets worse with movement.

## 2012-02-13 NOTE — ED Notes (Signed)
Pt c/o cold symptoms and difficulty breathing since yesterday.  Also reports intermittent fever.

## 2012-02-13 NOTE — H&P (Signed)
[facility] History and Physical  [name]  WUJ:[id number]  DOB: 12/14/1965   DOA: 02/13/2012   PCP:   Milinda Antis, MD   Chief Complaint:  Hypoxia since yesterday  HPI: [name] is an 46 y.o.  obese Caucasian  female, with a history of multiple episodes of wheezing with respiratory illness during childhood, now a chronic tobacco smoker, and a diagnosis of COPD, maintained on home oxygen. She has her own oximeter at home and tends to wear her oxygen only when the readings are low.  Yesterday morning she woke with chest tightness and shortness of breath and cough and despite the use of her nebulizer has gotten progressively worse to the point where today she has to wear oxygen at 4 L per minute to keep her O2 sats at 90% while awake , and when she wakes from sleep she feels much worse as if her oxygen level has been very low during sleep. Eventually she decided to come to the emergency room for evaluation, and despite our long nebulization she continues to wheeze and r remains hypoxic on 6 L/min.  Cough is productive of white, she has been having a fever but no focal chest pain except with coughing. She had her flu shot 3 weeks ago. Also about 3 weeks ago she developed a Bell's palsy treated with one week steroid taper. One week ago she developed otitis externa and was treated with polymyxin neomycin ear drops   Rewiew of Systems:   All systems negative except as marked bold or noted in the HPI;  Constitutional: Negative for malaise,  ;  Eyes: Negative for eye pain, redness and discharge. ;  ENMT: Negative for hoarseness, nasal congestion, sinus pressure and sore throat. ;  Cardiovascular: Negative for chest pain, palpitations, diaphoresis, and peripheral edema. ;  Respiratory: Negative for hemoptysis and stridor. ;  Gastrointestinal: Negative for nausea, vomiting, diarrhea, constipation, abdominal pain, melena, blood in stool, hematemesis, jaundice and rectal  bleeding. unusual weight loss..   Genitourinary: Negative for frequency, dysuria, incontinence,flank pain and hematuria; Musculoskeletal: Negative for neck pain. Negative for swelling and trauma.; back pain  Skin: . Negative for pruritus, rash, abrasions, bruising and skin lesion.; ulcerations Neuro: Negative for headache, lightheadedness and neck stiffness. Negative for weakness, altered level of consciousness , altered mental status, extremity weakness, burning feet, involuntary movement, seizure and syncope.  Psych: negative for anxiety, , insomnia, tearfulness, panic attacks, hallucinations, paranoia, suicidal or homicidal ideation    Past Medical History  Diagnosis Date  . Diabetes mellitus   . COPD (chronic obstructive pulmonary disease)   . HTN (hypertension)   . Low back pain   . Tachycardia     never had test done since no insurance  . Depression   . Asthma   . Shortness of breath   . Anxiety   . Gastric erosions     EGD 08/2010.  . Internal hemorrhoids     Colonoscopy 5/12.  [name] Heavy menses   . Chronic respiratory failure with hypoxia     On 2-3 L of oxygen at home  . GERD (gastroesophageal reflux disease)   . Anemia     Past Surgical History  Procedure Date  . Cholecystectomy 1990  . Cesarean section     twice  . Kidney surgery     as child for blockages  . Tubal ligation   . Tonsillectomy   . Wrist surgery 1995    Lt wrist  . Tympanostomy tube placement   .  Hysteroscopy with thermachoice 01/17/2012    Procedure: HYSTEROSCOPY WITH THERMACHOICE;  Surgeon: Lazaro Arms, MD;  Location: [facility];  Service: Gynecology;  Laterality: N/A;  total therapy time: 9:13sec  D5W  18 ml in, D5W   18ml out, temperture 87degrees celcious  . Uterine ablation     Medications:  HOME MEDS: Prior to Admission medications   Medication Sig Start Date End Date Taking? Authorizing Provider  albuterol (PROVENTIL HFA) 108 (90 BASE) MCG/ACT inhaler Inhale 2 puffs into the lungs every 4  (four) hours as needed for wheezing or shortness of breath. Wheezing/shortnes of breath 11/09/11  Yes Salley Scarlet, MD  albuterol (PROVENTIL) (5 MG/ML) 0.5% nebulizer solution Take 2.5 mg by nebulization 4 (four) times daily as needed. For shortness of breath 10/25/11 10/24/12 Yes Erick Blinks, MD  citalopram (CELEXA) 20 MG tablet Take 20 mg by mouth every morning. 08/31/11 08/30/12 Yes Salley Scarlet, MD  dextromethorphan 15 MG/5ML syrup Take 10 mLs by mouth 4 (four) times daily as needed. Cold Symptoms   Yes Historical Provider, MD  ferrous sulfate (IRON SUPPLEMENT) 325 (65 FE) MG tablet Take 325 mg by mouth daily with breakfast.   Yes Historical Provider, MD  fluticasone (FLONASE) 50 MCG/ACT nasal spray Place 2 sprays into the nose daily. 01/11/12  Yes Salley Scarlet, MD  gabapentin (NEURONTIN) 300 MG capsule Take 1 capsule (300 mg total) by mouth 3 (three) times daily. 11/09/11 11/08/12 Yes Salley Scarlet, MD  ibuprofen (ADVIL,MOTRIN) 200 MG tablet Take 200-400 mg by mouth daily as needed. For pain    Yes Historical Provider, MD  metFORMIN (GLUCOPHAGE) 500 MG tablet Take 500 mg by mouth 2 (two) times daily. 01/24/12  Yes Christiane Ha, MD  mometasone-formoterol (DULERA) 100-5 MCG/ACT AERO Inhale 2 puffs into the lungs 2 (two) times daily. 12/22/11 12/21/12 Yes Nyoka Cowden, MD  Multiple Vitamin (MULTIVITAMIN) capsule Take 1 capsule by mouth daily.   Yes Historical Provider, MD  neomycin-polymyxin-hydrocortisone (CORTISPORIN) otic solution Place 3 drops into the right ear 4 (four) times daily. For 1 week 02/08/12  Yes Salley Scarlet, MD  nystatin (MYCOSTATIN) 100000 UNIT/ML suspension Take 5 mLs (500,000 Units total) by mouth 4 (four) times daily. 02/08/12  Yes Salley Scarlet, MD  omeprazole (PRILOSEC) 20 MG capsule Take 20 mg by mouth every morning.   Yes Historical Provider, MD  ondansetron (ZOFRAN) 8 MG tablet Take 1 tablet (8 mg total) by mouth every 8 (eight) hours as needed for nausea.  01/17/12  Yes Lazaro Arms, MD  oxyCODONE-acetaminophen (PERCOCET/ROXICET) 5-325 MG per tablet Take 1 tablet by mouth 3 (three) times daily as needed for pain. 02/08/12 03/26/12 Yes Salley Scarlet, MD  predniSONE (DELTASONE) 10 MG tablet Take 10-60 mg by mouth. 6 tablets daily for a week, then decrease by 1 tablet each day until off. 01/24/12  Yes Christiane Ha, MD  valsartan (DIOVAN) 80 MG tablet Take 1 tablet (80 mg total) by mouth daily. 02/08/12  Yes Salley Scarlet, MD     Allergies:  Allergies  Allergen Reactions  . Codeine Itching and Nausea Only  . Wellbutrin (Bupropion Hcl) Hives    Social History:   reports that she has been smoking Cigarettes.  She has a 30 pack-year smoking history. She has never used smokeless tobacco. She reports that she drinks alcohol. She reports that she does not use illicit drugs.  Family History: Family History  Problem Relation Age of Onset  .  Heart attack Father 8    deceased, etoh use  . Heart disease Father   . Heart attack Mother 63    deceased  . Diabetes Mother   . Breast cancer Mother   . Heart failure Mother     oxygen dependence, nonsmoker  . Heart disease Mother   . Depression Mother   . Cancer Mother   . Colon cancer Neg Hx   . Liver disease Maternal Aunt 40    died while on liver transplant list  . Heart attack Maternal Grandmother     premature CAD  . Ulcers Sister   . Hypertension Sister      Physical Exam: Filed Vitals:   02/13/12 1549 02/13/12 1652 02/13/12 1811 02/13/12 1956  BP: 98/46   103/47  Pulse: 106  108 99  Temp:   98 F (36.7 C) 98.1 F (36.7 C)  TempSrc:    Oral  Resp: 20  22 17   Height:    5\' 4"  (1.626 m)  Weight:    113.6 kg (250 lb 7.1 oz)  SpO2: 89% 91% 90% 96%   Blood pressure 103/47, pulse 99, temperature 98.1 F (36.7 C), temperature source Oral, resp. rate 17, height 5\' 4"  (1.626 m), weight 113.6 kg (250 lb 7.1 oz), last menstrual period 01/17/2012, SpO2 96.00%.  GEN:  Pleasant  obese middle-aged Caucasian lady lying in the stretcher in no acute distress; cooperative with exam PSYCH:  alert and oriented x4; does not appear anxious or depressed; affect is appropriate. HEENT: Mucous membranes pink and anicteric; PERRLA; ptosis right eye; no cervical lymphadenopathy nor thyromegaly or carotid bruit; no JVD; thick neck Breasts:: Not examined CHEST WALL: No tenderness CHEST: Normal respiration, diffuse expiratory wheezing bilaterally HEART: Regular rate and rhythm; no murmurs rubs or gallops BACK: No kyphosis or scoliosis; no CVA tenderness ABDOMEN: Obese, soft non-tender; no masses, no organomegaly, normal abdominal bowel sounds; no intertriginous candida. Rectal Exam: Not done EXTREMITIES: No bone or joint deformity;  no edema; no ulcerations. Genitalia: not examined PULSES: 2+ and symmetric SKIN: Normal hydration no rash or ulceration CNS: Right facial nerve palsy; no other focal neurologic deficit    Labs on Admission:  Basic Metabolic Panel:  Lab 02/13/12 1610  NA 131*  K 3.3*  CL 92*  CO2 25  GLUCOSE 202*  BUN 8  CREATININE 0.74  CALCIUM 9.2  MG --  PHOS --   Liver Function Tests: No results found for this basename: AST:5,ALT:5,ALKPHOS:5,BILITOT:5,PROT:5,ALBUMIN:5 in the last 168 hours No results found for this basename: LIPASE:5,AMYLASE:5 in the last 168 hours No results found for this basename: AMMONIA:5 in the last 168 hours CBC:  Lab 02/13/12 1440  WBC 12.9*  NEUTROABS --  HGB 11.1*  HCT 37.0  MCV 73.0*  PLT 285   Cardiac Enzymes:  Lab 02/13/12 1440  CKTOTAL --  CKMB --  CKMBINDEX --  TROPONINI <0.30   BNP: No components found with this basename: POCBNP:5 D-dimer: No components found with this basename: D-DIMER:5 CBG: No results found for this basename: GLUCAP:5 in the last 168 hours  Radiological Exams on Admission: Dg Chest 2 View  02/13/2012  *RADIOLOGY REPORT*  Clinical Data: Chest pain.  CHEST - 2 VIEW  Comparison:  October 19, 2011.  Findings: Cardiomediastinal silhouette appears normal. Peribronchial thickening is seen bilaterally which is unchanged compared to prior exam.  No acute pulmonary disease is noted.  Bony thorax is intact.  IMPRESSION: No acute cardiopulmonary abnormality seen.   Original Report Authenticated  By: Venita Sheffield., M.D.     EKG: Independently reviewed. Sinus tachycardia; flattening of T waves in lateral leads.  Assessment/Plan Present on Admission:  .Acute-on-chronic respiratory failure .COPD exacerbation .Tobacco abuse .Morbid obesity .Hypokalemia .DM type 2 (diabetes mellitus, type 2) .HTN (hypertension)  .Bell's palsy .Back pain  .Depression   PLAN: Respiratory support in the ICU including continuous oxygen, round-the-clock nebulization, nicotine replacement, antibiotics.  Counseled on tobacco cessation.  Increasing metformin dose while on steroid and give moderate sliding scale coverage  Other plans as per orders.  Code Status: FULL CODE   Rakwon Letourneau [place] [facility] Pager [phone]   02/13/2012, 8:54 PM

## 2012-02-13 NOTE — ED Notes (Signed)
After continuous neb tx, pt SpO2 83-85% on O2 3 L; increased to 4L/min with no change in SpO2; Dr. Juleen China notified and he is in to examine pt.  Instructed per EDP to keep sats above 88%; increased O2 to 6L/min per [place] and pt SpO2 increased to 90-92%.

## 2012-02-14 DIAGNOSIS — J441 Chronic obstructive pulmonary disease with (acute) exacerbation: Secondary | ICD-10-CM

## 2012-02-14 DIAGNOSIS — E119 Type 2 diabetes mellitus without complications: Secondary | ICD-10-CM

## 2012-02-14 DIAGNOSIS — E871 Hypo-osmolality and hyponatremia: Secondary | ICD-10-CM

## 2012-02-14 DIAGNOSIS — J9620 Acute and chronic respiratory failure, unspecified whether with hypoxia or hypercapnia: Secondary | ICD-10-CM

## 2012-02-14 LAB — BASIC METABOLIC PANEL
BUN: 9 mg/dL (ref 6–23)
CO2: 25 mEq/L (ref 19–32)
Calcium: 9.1 mg/dL (ref 8.4–10.5)
Chloride: 98 mEq/L (ref 96–112)
Creatinine, Ser: 0.57 mg/dL (ref 0.50–1.10)
GFR calc Af Amer: >90 mL/min (ref >90)
GFR calc non Af Amer: >90 mL/min (ref >90)
Glucose, Bld: 245 mg/dL — ABNORMAL HIGH (ref 70–99)
Potassium: 4.5 mEq/L (ref 3.5–5.1)
Sodium: 134 mEq/L — ABNORMAL LOW (ref 135–145)

## 2012-02-14 LAB — CBC
HCT: 34.6 % — ABNORMAL LOW (ref 36.0–46.0)
Hemoglobin: 10.3 g/dL — ABNORMAL LOW (ref 12.0–15.0)
MCH: 21.7 pg — ABNORMAL LOW (ref 26.0–34.0)
MCHC: 29.8 g/dL — ABNORMAL LOW (ref 30.0–36.0)
MCV: 72.8 fL — ABNORMAL LOW (ref 78.0–100.0)
Platelets: 284 10*3/uL (ref 150–400)
RBC: 4.75 MIL/uL (ref 3.87–5.11)
RDW: 18 % — ABNORMAL HIGH (ref 11.5–15.5)
WBC: 15.5 10*3/uL — ABNORMAL HIGH (ref 4.0–10.5)

## 2012-02-14 LAB — GLUCOSE, CAPILLARY
Glucose-Capillary: 238 mg/dL — ABNORMAL HIGH (ref 70–99)
Glucose-Capillary: 251 mg/dL — ABNORMAL HIGH (ref 70–99)
Glucose-Capillary: 213 mg/dL — ABNORMAL HIGH (ref 70–99)
Glucose-Capillary: 244 mg/dL — ABNORMAL HIGH (ref 70–99)

## 2012-02-14 LAB — HEMOGLOBIN A1C
Hgb A1c MFr Bld: 6.9 % — ABNORMAL HIGH (ref <5.7)
Mean Plasma Glucose: 151 mg/dL — ABNORMAL HIGH (ref <117)

## 2012-02-14 LAB — TSH: TSH: 2.627 u[IU]/mL (ref 0.350–4.500)

## 2012-02-14 MED ORDER — DEXTROSE 5 % IV SOLN
1.0000 g | INTRAVENOUS | Status: DC
  Administered 2012-02-14 – 2012-02-19 (×6): 1 g via INTRAVENOUS
  Filled 2012-02-14 (×7): qty 10

## 2012-02-14 MED ORDER — INSULIN ASPART 100 UNIT/ML ~~LOC~~ SOLN
3.0000 [IU] | Freq: Three times a day (TID) | SUBCUTANEOUS | Status: DC
  Administered 2012-02-14 – 2012-02-16 (×6): 3 [IU] via SUBCUTANEOUS

## 2012-02-14 MED ORDER — ZOLPIDEM TARTRATE 5 MG PO TABS
5.0000 mg | ORAL_TABLET | Freq: Every evening | ORAL | Status: DC | PRN
  Administered 2012-02-14 – 2012-02-16 (×3): 5 mg via ORAL
  Filled 2012-02-14 (×3): qty 1

## 2012-02-14 MED ORDER — INSULIN ASPART 100 UNIT/ML ~~LOC~~ SOLN
0.0000 [IU] | Freq: Every day | SUBCUTANEOUS | Status: DC
  Administered 2012-02-14: 2 [IU] via SUBCUTANEOUS
  Administered 2012-02-15: 3 [IU] via SUBCUTANEOUS
  Administered 2012-02-16 – 2012-02-18 (×3): 2 [IU] via SUBCUTANEOUS

## 2012-02-14 MED ORDER — INSULIN GLARGINE 100 UNIT/ML ~~LOC~~ SOLN
15.0000 [IU] | Freq: Two times a day (BID) | SUBCUTANEOUS | Status: DC
  Administered 2012-02-14 – 2012-02-16 (×6): 15 [IU] via SUBCUTANEOUS

## 2012-02-14 MED ORDER — FLUTICASONE-SALMETEROL 250-50 MCG/DOSE IN AEPB: 1.0000 | INHALATION_SPRAY | Freq: Two times a day (BID) | RESPIRATORY_TRACT | Status: DC

## 2012-02-14 MED ORDER — METHYLPREDNISOLONE SODIUM SUCC 125 MG IJ SOLR
80.0000 mg | Freq: Four times a day (QID) | INTRAMUSCULAR | Status: DC
  Administered 2012-02-14 – 2012-02-18 (×16): 80 mg via INTRAVENOUS
  Filled 2012-02-14 (×17): qty 2

## 2012-02-14 MED ORDER — INSULIN ASPART 100 UNIT/ML ~~LOC~~ SOLN
0.0000 [IU] | Freq: Three times a day (TID) | SUBCUTANEOUS | Status: DC
  Administered 2012-02-14 (×2): 4 [IU] via SUBCUTANEOUS
  Administered 2012-02-15: 7 [IU] via SUBCUTANEOUS
  Administered 2012-02-15 – 2012-02-16 (×5): 4 [IU] via SUBCUTANEOUS
  Administered 2012-02-17: 7 [IU] via SUBCUTANEOUS
  Administered 2012-02-17 (×2): 4 [IU] via SUBCUTANEOUS
  Administered 2012-02-18: 11 [IU] via SUBCUTANEOUS
  Administered 2012-02-18 (×2): 4 [IU] via SUBCUTANEOUS
  Administered 2012-02-19: 7 [IU] via SUBCUTANEOUS
  Administered 2012-02-19: 3 [IU] via SUBCUTANEOUS
  Administered 2012-02-19: 11 [IU] via SUBCUTANEOUS
  Administered 2012-02-20: 4 [IU] via SUBCUTANEOUS

## 2012-02-14 NOTE — Progress Notes (Signed)
Subjective: The patient says that she is breathing just a little bit better. She acknowledges persistent chest congestion and wheezing. She has some chest tightness as well, but less than last night.  Objective: Vital signs in last 24 hours: Filed Vitals:   02/14/12 0600 02/14/12 0700 02/14/12 0800 02/14/12 0816  BP: 131/72 143/59 122/97   Pulse: 97 93 109   Temp:   98.4 F (36.9 C)   TempSrc:   Oral   Resp: 32 22 24   Height:      Weight:      SpO2: 89% 91% 86% 90%    Intake/Output Summary (Last 24 hours) at 02/14/12 1610 Last data filed at 02/14/12 0600  Gross per 24 hour  Intake 1126.67 ml  Output   1325 ml  Net -198.33 ml    Weight change:   Physical exam: General: Obese 46 year old Caucasian woman sitting up in bed, in no acute distress. Lungs: Diffuse bilateral wheezes and occasional crackles. Heart: Distant S1, S2, with mild tachycardia. Abdomen: Obese, positive bowel sounds, soft, nontender, nondistended. Extremities: No pedal edema.  Lab Results: Basic Metabolic Panel:  Basename 02/14/12 0500 02/13/12 2042 02/13/12 1440  NA 134* -- 131*  K 4.5 -- 3.3*  CL 98 -- 92*  CO2 25 -- 25  GLUCOSE 245* -- 202*  BUN 9 -- 8  CREATININE 0.57 -- 0.74  CALCIUM 9.1 -- 9.2  MG -- 1.9 --  PHOS -- -- --   Liver Function Tests:  [facility] 02/13/12 2042  AST 12  ALT 13  ALKPHOS 41  BILITOT 0.6  PROT 7.2  ALBUMIN 3.5   No results found for this basename: LIPASE:2,AMYLASE:2 in the last 72 hours No results found for this basename: AMMONIA:2 in the last 72 hours CBC:  Basename 02/14/12 0500 02/13/12 1440  WBC 15.5* 12.9*  NEUTROABS -- --  HGB 10.3* 11.1*  HCT 34.6* 37.0  MCV 72.8* 73.0*  PLT 284 285   Cardiac Enzymes:  Basename 02/13/12 1440  CKTOTAL --  CKMB --  CKMBINDEX --  TROPONINI <0.30   BNP: No results found for this basename: PROBNP:3 in the last 72 hours D-Dimer: No results found for this basename: DDIMER:2 in the last 72  hours CBG:  Basename 02/14/12 0736 02/13/12 2150  GLUCAP 238* 227*   Hemoglobin A1C: No results found for this basename: HGBA1C in the last 72 hours Fasting Lipid Panel: No results found for this basename: CHOL,HDL,LDLCALC,TRIG,CHOLHDL,LDLDIRECT in the last 72 hours Thyroid Function Tests: No results found for this basename: TSH,T4TOTAL,FREET4,T3FREE,THYROIDAB in the last 72 hours Anemia Panel: No results found for this basename: VITAMINB12,FOLATE,FERRITIN,TIBC,IRON,RETICCTPCT in the last 72 hours Coagulation: No results found for this basename: LABPROT:2,INR:2 in the last 72 hours Urine Drug Screen: Drugs of Abuse     Component Value Date/Time   LABOPIA NONE DETECTED 01/23/2012 1823   COCAINSCRNUR NONE DETECTED 01/23/2012 1823   LABBENZ NONE DETECTED 01/23/2012 1823   AMPHETMU NONE DETECTED 01/23/2012 1823   THCU POSITIVE* 01/23/2012 1823   LABBARB NONE DETECTED 01/23/2012 1823    Alcohol Level: No results found for this basename: ETH:2 in the last 72 hours Urinalysis:  Basename 02/13/12 2228  COLORURINE YELLOW  LABSPEC 1.010  PHURINE 6.0  GLUCOSEU NEGATIVE  HGBUR MODERATE*  BILIRUBINUR NEGATIVE  KETONESUR NEGATIVE  PROTEINUR NEGATIVE  UROBILINOGEN 0.2  NITRITE NEGATIVE  LEUKOCYTESUR NEGATIVE   Misc. Labs:   Micro: Recent Results (from the past 240 hour(s))  MRSA PCR SCREENING     Status:  Normal   Collection Time   02/13/12  7:44 PM      Component Value Range Status Comment   MRSA by PCR NEGATIVE  NEGATIVE Final     Studies/Results: Dg Chest 2 View  02/13/2012  *RADIOLOGY REPORT*  Clinical Data: Chest pain.  CHEST - 2 VIEW  Comparison: October 19, 2011.  Findings: Cardiomediastinal silhouette appears normal. Peribronchial thickening is seen bilaterally which is unchanged compared to prior exam.  No acute pulmonary disease is noted.  Bony thorax is intact.  IMPRESSION: No acute cardiopulmonary abnormality seen.   Original Report Authenticated By: Venita Sheffield.,  M.D.     Medications:  Scheduled:   . albuterol  10 mg Nebulization Once  . albuterol  2.5 mg Nebulization Q4H WA  . albuterol  5 mg Nebulization Once  . azithromycin  500 mg Intravenous Q24H  . citalopram  20 mg Oral q morning - 10a  . docusate sodium  100 mg Oral BID  . enoxaparin (LOVENOX) injection  40 mg Subcutaneous Q24H  . fluticasone  2 spray Each Nare Daily  . gabapentin  300 mg Oral TID  . insulin aspart  0-15 Units Subcutaneous TID WC  . insulin aspart  0-5 Units Subcutaneous QHS  . ipratropium  0.5 mg Nebulization Once  . ipratropium  0.5 mg Nebulization Q4H WA  . irbesartan  75 mg Oral Daily  . levofloxacin  500 mg Oral Once  . metFORMIN  1,000 mg Oral BID WC  . methylPREDNISolone (SOLU-MEDROL) injection  125 mg Intravenous Q6H  . mometasone-formoterol  2 puff Inhalation BID  . nicotine  21 mg Transdermal Daily  . nystatin  500,000 Units Oral QID  . pantoprazole  40 mg Oral Daily  . pneumococcal 23 valent vaccine  0.5 mL Intramuscular Tomorrow-1000  . predniSONE  40 mg Oral Once  . sodium chloride  500 mL Intravenous Once  . sodium chloride  3 mL Intravenous Q12H   Continuous:   . 0.9 % NaCl with KCl 20 mEq / L 50 mL/hr at 02/14/12 0600   ZOX:[id number], albuterol, bisacodyl, guaiFENesin-dextromethorphan, HYDROmorphone (DILAUDID) injection, ondansetron (ZOFRAN) IV, oxyCODONE, sodium phosphate, traZODone  Assessment: Principal Problem:  *Acute-on-chronic respiratory failure Active Problems:  Microcytic anemia  HTN (hypertension)  DM type 2 (diabetes mellitus, type 2)  Tobacco abuse  Hyponatremia  Depression  Morbid obesity  Back pain  COPD exacerbation  Bell's palsy  Hypokalemia   1. Acute on chronic hypoxic respiratory failure secondary to oxygen-dependent COPD with exacerbation. We'll continue oxygen, Dulera inhaler, Solu-Medrol, albuterol/Atrovent nebulizers, and antibiotics. Apparently she was given Levaquin in the emergency department  yesterday and is now on azithromycin alone. We'll add Rocephin.  Tobacco abuse. The patient was strongly advised to stop smoking. Continue nicotine replacement therapy.  Hypertension. Currently stable on ARB.  Type 2 diabetes mellitus. Her blood glucoses in the 200s. Will expect her capillary blood glucose to increase on IV Solu-Medrol. Will adjust insulin accordingly. We'll continue metformin.  Hypokalemia. Repleted/supplemented.  Hyponatremia. Improving on gentle IV fluid hydration.  Chronic back pain. We'll continue Neurontin and opiate analgesics as needed.  Chronic microcytic anemia. Stable. She has a history of gastric erosions and internal hemorrhoids. We'll continue Protonix.   Plan:  1. We'll order tobacco cessation counseling. 2. We'll add Rocephin to the antibiotic regimen. 3. We'll increase sliding scale NovoLog to resistive scale. We'll add twice a day dosing of Lantus. 4. Will adjust the dosing of Solu-Medrol accordingly.  LOS: 1 day   Johnross Nabozny 02/14/2012, 8:52 AM

## 2012-02-15 ENCOUNTER — Ambulatory Visit: Payer: Medicaid Other | Admitting: Internal Medicine

## 2012-02-15 DIAGNOSIS — R0902 Hypoxemia: Secondary | ICD-10-CM

## 2012-02-15 LAB — BASIC METABOLIC PANEL
BUN: 9 mg/dL (ref 6–23)
CO2: 26 mEq/L (ref 19–32)
Calcium: 9 mg/dL (ref 8.4–10.5)
Chloride: 101 mEq/L (ref 96–112)
Creatinine, Ser: 0.51 mg/dL (ref 0.50–1.10)
GFR calc Af Amer: >90 mL/min (ref >90)
GFR calc non Af Amer: >90 mL/min (ref >90)
Glucose, Bld: 158 mg/dL — ABNORMAL HIGH (ref 70–99)
Potassium: 3.9 mEq/L (ref 3.5–5.1)
Sodium: 140 mEq/L (ref 135–145)

## 2012-02-15 LAB — GLUCOSE, CAPILLARY
Glucose-Capillary: 169 mg/dL — ABNORMAL HIGH (ref 70–99)
Glucose-Capillary: 202 mg/dL — ABNORMAL HIGH (ref 70–99)
Glucose-Capillary: 182 mg/dL — ABNORMAL HIGH (ref 70–99)
Glucose-Capillary: 266 mg/dL — ABNORMAL HIGH (ref 70–99)

## 2012-02-15 LAB — CBC
HCT: 35.3 % — ABNORMAL LOW (ref 36.0–46.0)
Hemoglobin: 10.4 g/dL — ABNORMAL LOW (ref 12.0–15.0)
MCH: 21.8 pg — ABNORMAL LOW (ref 26.0–34.0)
MCHC: 29.5 g/dL — ABNORMAL LOW (ref 30.0–36.0)
MCV: 74 fL — ABNORMAL LOW (ref 78.0–100.0)
Platelets: 328 10*3/uL (ref 150–400)
RBC: 4.77 MIL/uL (ref 3.87–5.11)
RDW: 18.2 % — ABNORMAL HIGH (ref 11.5–15.5)
WBC: 29.6 10*3/uL — ABNORMAL HIGH (ref 4.0–10.5)

## 2012-02-15 LAB — HEMOGLOBIN A1C
Hgb A1c MFr Bld: 6.8 % — ABNORMAL HIGH (ref <5.7)
Mean Plasma Glucose: 148 mg/dL — ABNORMAL HIGH (ref <117)

## 2012-02-15 MED ORDER — HYDROCOD POLST-CHLORPHEN POLST 10-8 MG/5ML PO LQCR
5.0000 mL | Freq: Two times a day (BID) | ORAL | Status: DC | PRN
  Administered 2012-02-15 – 2012-02-19 (×6): 5 mL via ORAL
  Filled 2012-02-15 (×7): qty 5

## 2012-02-15 MED ORDER — DM-GUAIFENESIN ER 30-600 MG PO TB12
1.0000 | ORAL_TABLET | Freq: Two times a day (BID) | ORAL | Status: DC
  Administered 2012-02-15 – 2012-02-20 (×11): 1 via ORAL
  Filled 2012-02-15 (×11): qty 1

## 2012-02-15 NOTE — Progress Notes (Signed)
Subjective: The patient says that she is breathing just a little bit better. She also slept better than she had in the past. She still has chest congestion and a productive cough with tan color sputum. She is short of breath with limited activity.  Objective: Vital signs in last 24 hours: Filed Vitals:   02/15/12 0700 02/15/12 0751 02/15/12 0800 02/15/12 0900  BP:   110/63 118/64  Pulse: 98  110 114  Temp:   98.1 F (36.7 C)   TempSrc:   Oral   Resp: 21  20 21   Height:      Weight:      SpO2: 94% 94% 95% 92%    Intake/Output Summary (Last 24 hours) at 02/15/12 0981 Last data filed at 02/15/12 0800  Gross per 24 hour  Intake   3640 ml  Output   4050 ml  Net   -410 ml    Weight change: 0.218 kg (7.7 oz)  Physical exam: General: Obese 46 year old Caucasian woman sitting up in bed, in no acute distress. Lungs: Diffuse bilateral wheezes and occasional crackles, but slightly less than yesterday. Heart: Distant S1, S2, with mild tachycardia. Abdomen: Obese, positive bowel sounds, soft, nontender, nondistended. Extremities: No pedal edema.  Lab Results: Basic Metabolic Panel:  Basename 02/15/12 0404 02/14/12 0500 02/13/12 2042  NA 140 134* --  K 3.9 4.5 --  CL 101 98 --  CO2 26 25 --  GLUCOSE 158* 245* --  BUN 9 9 --  CREATININE 0.51 0.57 --  CALCIUM 9.0 9.1 --  MG -- -- 1.9  PHOS -- -- --   Liver Function Tests:  [facility] 02/13/12 2042  AST 12  ALT 13  ALKPHOS 41  BILITOT 0.6  PROT 7.2  ALBUMIN 3.5   No results found for this basename: LIPASE:2,AMYLASE:2 in the last 72 hours No results found for this basename: AMMONIA:2 in the last 72 hours CBC:  Basename 02/15/12 0404 02/14/12 0500  WBC 29.6* 15.5*  NEUTROABS -- --  HGB 10.4* 10.3*  HCT 35.3* 34.6*  MCV 74.0* 72.8*  PLT 328 284   Cardiac Enzymes:  Basename 02/13/12 1440  CKTOTAL --  CKMB --  CKMBINDEX --  TROPONINI <0.30   BNP: No results found for this basename: PROBNP:3 in the last 72  hours D-Dimer: No results found for this basename: DDIMER:2 in the last 72 hours CBG:  Basename 02/15/12 0735 02/14/12 2104 02/14/12 1623 02/14/12 1130 02/14/12 0736 02/13/12 2150  GLUCAP 169* 244* 213* 251* 238* 227*   Hemoglobin A1C:  Basename 02/13/12 2042  HGBA1C 6.9*   Fasting Lipid Panel: No results found for this basename: CHOL,HDL,LDLCALC,TRIG,CHOLHDL,LDLDIRECT in the last 72 hours Thyroid Function Tests:  Basename 02/13/12 2042  TSH 2.627  T4TOTAL --  FREET4 --  T3FREE --  THYROIDAB --   Anemia Panel: No results found for this basename: VITAMINB12,FOLATE,FERRITIN,TIBC,IRON,RETICCTPCT in the last 72 hours Coagulation: No results found for this basename: LABPROT:2,INR:2 in the last 72 hours Urine Drug Screen: Drugs of Abuse     Component Value Date/Time   LABOPIA NONE DETECTED 01/23/2012 1823   COCAINSCRNUR NONE DETECTED 01/23/2012 1823   LABBENZ NONE DETECTED 01/23/2012 1823   AMPHETMU NONE DETECTED 01/23/2012 1823   THCU POSITIVE* 01/23/2012 1823   LABBARB NONE DETECTED 01/23/2012 1823    Alcohol Level: No results found for this basename: ETH:2 in the last 72 hours Urinalysis:  Basename 02/13/12 2228  COLORURINE YELLOW  LABSPEC 1.010  PHURINE 6.0  GLUCOSEU NEGATIVE  HGBUR  MODERATE*  BILIRUBINUR NEGATIVE  KETONESUR NEGATIVE  PROTEINUR NEGATIVE  UROBILINOGEN 0.2  NITRITE NEGATIVE  LEUKOCYTESUR NEGATIVE   Misc. Labs:   Micro: Recent Results (from the past 240 hour(s))  MRSA PCR SCREENING     Status: Normal   Collection Time   02/13/12  7:44 PM      Component Value Range Status Comment   MRSA by PCR NEGATIVE  NEGATIVE Final     Studies/Results: Dg Chest 2 View  02/13/2012  *RADIOLOGY REPORT*  Clinical Data: Chest pain.  CHEST - 2 VIEW  Comparison: October 19, 2011.  Findings: Cardiomediastinal silhouette appears normal. Peribronchial thickening is seen bilaterally which is unchanged compared to prior exam.  No acute pulmonary disease is noted.  Bony  thorax is intact.  IMPRESSION: No acute cardiopulmonary abnormality seen.   Original Report Authenticated By: Venita Sheffield., M.D.     Medications:  Scheduled:    . albuterol  2.5 mg Nebulization Q4H WA  . azithromycin  500 mg Intravenous Q24H  . cefTRIAXone (ROCEPHIN)  IV  1 g Intravenous Q24H  . citalopram  20 mg Oral q morning - 10a  . docusate sodium  100 mg Oral BID  . enoxaparin (LOVENOX) injection  40 mg Subcutaneous Q24H  . fluticasone  2 spray Each Nare Daily  . gabapentin  300 mg Oral TID  . insulin aspart  0-20 Units Subcutaneous TID WC  . insulin aspart  0-5 Units Subcutaneous QHS  . insulin aspart  3 Units Subcutaneous TID WC  . insulin glargine  15 Units Subcutaneous BID  . ipratropium  0.5 mg Nebulization Q4H WA  . irbesartan  75 mg Oral Daily  . metFORMIN  1,000 mg Oral BID WC  . methylPREDNISolone (SOLU-MEDROL) injection  80 mg Intravenous Q6H  . mometasone-formoterol  2 puff Inhalation BID  . nicotine  21 mg Transdermal Daily  . nystatin  500,000 Units Oral QID  . pantoprazole  40 mg Oral Daily  . pneumococcal 23 valent vaccine  0.5 mL Intramuscular Tomorrow-1000  . sodium chloride  3 mL Intravenous Q12H   Continuous:    . 0.9 % NaCl with KCl 20 mEq / L 50 mL/hr at 02/15/12 0800   ZHY:[id number], albuterol, bisacodyl, guaiFENesin-dextromethorphan, HYDROmorphone (DILAUDID) injection, ondansetron (ZOFRAN) IV, oxyCODONE, zolpidem, DISCONTD: traZODone  Assessment: Principal Problem:  *Acute-on-chronic respiratory failure Active Problems:  Microcytic anemia  HTN (hypertension)  DM type 2 (diabetes mellitus, type 2)  Tobacco abuse  Hyponatremia  Depression  Morbid obesity  Back pain  COPD exacerbation  Bell's palsy  Hypokalemia   1. Acute on chronic hypoxic respiratory failure secondary to oxygen-dependent COPD with exacerbation. She is still somewhat hypoxic on 4-5 L of nasal cannula oxygen. We'll continue oxygen, Dulera inhaler, Solu-Medrol,  albuterol/Atrovent nebulizers, and antibiotics. Apparently she was given Levaquin in the emergency department. She is now on azithromycin and Rocephin. And  Tobacco abuse. The patient was strongly advised to stop smoking. Continue nicotine replacement therapy.  Hypertension. Currently stable on ARB.  Type 2 diabetes mellitus. Her capillary blood glucose is better with adjustment in sliding scale NovoLog and Lantus.  Hypokalemia. Repleted/supplemented.  Hyponatremia. Resolved on gentle IV fluid hydration.  Chronic back pain. We'll continue Neurontin and opiate analgesics as needed.  Chronic microcytic anemia. Stable. She has a history of gastric erosions and internal hemorrhoids. We'll continue Protonix.  Leukocytosis. Steroid-induced.   Plan:  1. We'll monitor one more day and the step down unit. 2. We'll add Mucinex  twice a day. 3. Would add when necessary Tussionex for cough.    LOS: 2 days   Adrianna Dudas 02/15/2012, 9:38 AM

## 2012-02-15 NOTE — Progress Notes (Signed)
UR Chart Review Completed  

## 2012-02-15 NOTE — Progress Notes (Addendum)
Inpatient Diabetes Program Recommendations  AACE/ADA: New Consensus Statement on Inpatient Glycemic Control (2013)  Target Ranges:  Prepandial:   less than 140 mg/dL      Peak postprandial:   less than 180 mg/dL (1-2 hours)      Critically ill patients:  140 - 180 mg/dL   Reason for Visit: Post-prandial hyperglycemia Due to steroid therapy only.  (HgbA1C is 6.9% at home.  Obviously, the steroids are very influential on glucose levels.)  Fasting glucose is improved with addition of Lantus bid. However, glucose before lunch and dinner is elevated. Inpatient Diabetes Program Recommendations Insulin - Meal Coverage: Please increase to 5 units tidwc.    Note: Thank you, Lenor Coffin, RN, CNS, Diabetes Coordinator [phone])

## 2012-02-16 DIAGNOSIS — I10 Essential (primary) hypertension: Secondary | ICD-10-CM

## 2012-02-16 LAB — GLUCOSE, CAPILLARY
Glucose-Capillary: 174 mg/dL — ABNORMAL HIGH (ref 70–99)
Glucose-Capillary: 169 mg/dL — ABNORMAL HIGH (ref 70–99)
Glucose-Capillary: 177 mg/dL — ABNORMAL HIGH (ref 70–99)

## 2012-02-16 MED ORDER — ALPRAZOLAM 0.5 MG PO TABS
0.5000 mg | ORAL_TABLET | Freq: Four times a day (QID) | ORAL | Status: DC | PRN
  Administered 2012-02-16 – 2012-02-20 (×14): 0.5 mg via ORAL
  Filled 2012-02-16 (×14): qty 1

## 2012-02-16 MED ORDER — MAGNESIUM SULFATE 40 MG/ML IJ SOLN
2.0000 g | Freq: Once | INTRAMUSCULAR | Status: AC
  Administered 2012-02-16: 2 g via INTRAVENOUS
  Filled 2012-02-16: qty 50

## 2012-02-16 MED ORDER — POTASSIUM CHLORIDE CRYS ER 20 MEQ PO TBCR
20.0000 meq | EXTENDED_RELEASE_TABLET | Freq: Two times a day (BID) | ORAL | Status: AC
  Administered 2012-02-16 (×2): 20 meq via ORAL
  Filled 2012-02-16 (×2): qty 1

## 2012-02-16 MED ORDER — MAGNESIUM SULFATE 50 % IJ SOLN: 2.0000 g | Freq: Once | INTRAVENOUS | Status: DC

## 2012-02-16 MED ORDER — INSULIN ASPART 100 UNIT/ML ~~LOC~~ SOLN
5.0000 [IU] | Freq: Three times a day (TID) | SUBCUTANEOUS | Status: DC
  Administered 2012-02-16 – 2012-02-17 (×3): 5 [IU] via SUBCUTANEOUS

## 2012-02-16 MED ORDER — IPRATROPIUM BROMIDE 0.02 % IN SOLN
RESPIRATORY_TRACT | Status: AC
  Filled 2012-02-16: qty 2.5

## 2012-02-16 MED ORDER — FUROSEMIDE 10 MG/ML IJ SOLN
10.0000 mg | Freq: Two times a day (BID) | INTRAMUSCULAR | Status: AC
  Administered 2012-02-16 (×2): 10 mg via INTRAVENOUS
  Filled 2012-02-16 (×2): qty 2

## 2012-02-16 NOTE — Progress Notes (Signed)
Subjective: The patient says that she coughed all night. She says that her chest congestion is "breaking up." She acknowledges that Mucinex was started yesterday.  Objective: Vital signs in last 24 hours: Filed Vitals:   02/16/12 0600 02/16/12 0734 02/16/12 0748 02/16/12 0849  BP: 157/96     Pulse: 96   114  Temp:  98.2 F (36.8 C)  98 F (36.7 C)  TempSrc:  Oral  Oral  Resp: 18   22  Height:      Weight:      SpO2: 96%  94% 91%    Intake/Output Summary (Last 24 hours) at 02/16/12 0926 Last data filed at 02/16/12 0843  Gross per 24 hour  Intake   4043 ml  Output   4750 ml  Net   -707 ml    Weight change: 2.3 kg (5 lb 1.1 oz)  Physical exam: General: Obese 46 year old Caucasian woman sitting up in bed, in no acute distress. Lungs: Diffuse bilateral rhonchus wheezes and occasional crackles Heart: Distant S1, S2, with mild tachycardia. Abdomen: Obese, positive bowel sounds, soft, nontender, nondistended. Extremities: No pedal edema.  Lab Results: Basic Metabolic Panel:  Basename 02/15/12 0404 02/14/12 0500 02/13/12 2042  NA 140 134* --  K 3.9 4.5 --  CL 101 98 --  CO2 26 25 --  GLUCOSE 158* 245* --  BUN 9 9 --  CREATININE 0.51 0.57 --  CALCIUM 9.0 9.1 --  MG -- -- 1.9  PHOS -- -- --   Liver Function Tests:  [facility] 02/13/12 2042  AST 12  ALT 13  ALKPHOS 41  BILITOT 0.6  PROT 7.2  ALBUMIN 3.5   No results found for this basename: LIPASE:2,AMYLASE:2 in the last 72 hours No results found for this basename: AMMONIA:2 in the last 72 hours CBC:  Basename 02/15/12 0404 02/14/12 0500  WBC 29.6* 15.5*  NEUTROABS -- --  HGB 10.4* 10.3*  HCT 35.3* 34.6*  MCV 74.0* 72.8*  PLT 328 284   Cardiac Enzymes:  Basename 02/13/12 1440  CKTOTAL --  CKMB --  CKMBINDEX --  TROPONINI <0.30   BNP: No results found for this basename: PROBNP:3 in the last 72 hours D-Dimer: No results found for this basename: DDIMER:2 in the last 72 hours CBG:  Basename  02/16/12 0732 02/15/12 2115 02/15/12 1625 02/15/12 1146 02/15/12 0735 02/14/12 2104  GLUCAP 174* 266* 182* 202* 169* 244*   Hemoglobin A1C:  Basename 02/15/12 0404  HGBA1C 6.8*   Fasting Lipid Panel: No results found for this basename: CHOL,HDL,LDLCALC,TRIG,CHOLHDL,LDLDIRECT in the last 72 hours Thyroid Function Tests:  Basename 02/13/12 2042  TSH 2.627  T4TOTAL --  FREET4 --  T3FREE --  THYROIDAB --   Anemia Panel: No results found for this basename: VITAMINB12,FOLATE,FERRITIN,TIBC,IRON,RETICCTPCT in the last 72 hours Coagulation: No results found for this basename: LABPROT:2,INR:2 in the last 72 hours Urine Drug Screen: Drugs of Abuse     Component Value Date/Time   LABOPIA NONE DETECTED 01/23/2012 1823   COCAINSCRNUR NONE DETECTED 01/23/2012 1823   LABBENZ NONE DETECTED 01/23/2012 1823   AMPHETMU NONE DETECTED 01/23/2012 1823   THCU POSITIVE* 01/23/2012 1823   LABBARB NONE DETECTED 01/23/2012 1823    Alcohol Level: No results found for this basename: ETH:2 in the last 72 hours Urinalysis:  Basename 02/13/12 2228  COLORURINE YELLOW  LABSPEC 1.010  PHURINE 6.0  GLUCOSEU NEGATIVE  HGBUR MODERATE*  BILIRUBINUR NEGATIVE  KETONESUR NEGATIVE  PROTEINUR NEGATIVE  UROBILINOGEN 0.2  NITRITE NEGATIVE  LEUKOCYTESUR  NEGATIVE   Misc. Labs:   Micro: Recent Results (from the past 240 hour(s))  MRSA PCR SCREENING     Status: Normal   Collection Time   02/13/12  7:44 PM      Component Value Range Status Comment   MRSA by PCR NEGATIVE  NEGATIVE Final     Studies/Results: No results found.  Medications:  Scheduled:    . albuterol  2.5 mg Nebulization Q4H WA  . azithromycin  500 mg Intravenous Q24H  . cefTRIAXone (ROCEPHIN)  IV  1 g Intravenous Q24H  . citalopram  20 mg Oral q morning - 10a  . dextromethorphan-guaiFENesin  1 tablet Oral BID  . docusate sodium  100 mg Oral BID  . enoxaparin (LOVENOX) injection  40 mg Subcutaneous Q24H  . fluticasone  2 spray Each  Nare Daily  . furosemide  10 mg Intravenous Q12H  . gabapentin  300 mg Oral TID  . insulin aspart  0-20 Units Subcutaneous TID WC  . insulin aspart  0-5 Units Subcutaneous QHS  . insulin aspart  3 Units Subcutaneous TID WC  . insulin glargine  15 Units Subcutaneous BID  . ipratropium  0.5 mg Nebulization Q4H WA  . irbesartan  75 mg Oral Daily  . magnesium sulfate 1 - 4 g bolus IVPB  2 g Intravenous Once  . metFORMIN  1,000 mg Oral BID WC  . methylPREDNISolone (SOLU-MEDROL) injection  80 mg Intravenous Q6H  . mometasone-formoterol  2 puff Inhalation BID  . nicotine  21 mg Transdermal Daily  . nystatin  500,000 Units Oral QID  . pantoprazole  40 mg Oral Daily  . pneumococcal 23 valent vaccine  0.5 mL Intramuscular Tomorrow-1000  . potassium chloride  20 mEq Oral BID  . sodium chloride  3 mL Intravenous Q12H  . DISCONTD: magnesium sulfate LVP 250-500 ml  2 g Intravenous Once   Continuous:    . 0.9 % NaCl with KCl 20 mEq / L 50 mL/hr at 02/16/12 0600   ZOX:[id number], albuterol, bisacodyl, chlorpheniramine-HYDROcodone, guaiFENesin-dextromethorphan, HYDROmorphone (DILAUDID) injection, ondansetron (ZOFRAN) IV, oxyCODONE, zolpidem  Assessment: Principal Problem:  *Acute-on-chronic respiratory failure Active Problems:  Microcytic anemia  HTN (hypertension)  DM type 2 (diabetes mellitus, type 2)  Tobacco abuse  Hyponatremia  Depression  Morbid obesity  Back pain  COPD exacerbation  Bell's palsy  Hypokalemia   1. Acute on chronic hypoxic respiratory failure secondary to oxygen-dependent COPD with exacerbation. She is slightly less hypoxic on 4-5 L of nasal cannula oxygen. She has more rhonchus wheezing, which may be an indication that her airways opening up more. Mucinex and Tussionex were started yesterday. Both will be continued. We'll continue oxygen, Dulera inhaler, Solu-Medrol, albuterol/Atrovent nebulizers, and antibiotics. Apparently she was given Levaquin in the  emergency department. We'll continue azithromycin and Rocephin.   Tobacco abuse. The patient was strongly advised to stop smoking. Continue nicotine replacement therapy.  Hypertension. Patient's blood pressures trending up, which may be the consequence of steroid therapy. Continue ARB. we'll continue to monitor for adjustments if needed.   Type 2 diabetes mellitus. Her capillary blood glucose is better with adjustment in sliding scale NovoLog and Lantus. We'll add meal dosing of NovoLog per recommendation by diabetes coordinator. Hemoglobin A1c is 6.8.  Hypokalemia. Repleted/supplemented.  Hyponatremia. Resolved on gentle IV fluid hydration.  Chronic back pain. We'll continue Neurontin and opiate analgesics as needed.  Chronic microcytic anemia. Stable. She has a history of gastric erosions and internal hemorrhoids. We'll continue Protonix.  Leukocytosis. Steroid-induced.   Plan:  1. We'll at mealtime dosing of NovoLog. 2. Magnesium sulfate was ordered earlier, hopefully to help decrease the bronchospasms. 3. Small dosing of IV Lasix was ordered earlier, hopefully to decrease potential edema associated with chronic inflammation with COPD exacerbation. 4. Transfer to telemetry.     LOS: 3 days   Kellyn Mansfield 02/16/2012, 9:26 AM

## 2012-02-16 NOTE — Progress Notes (Signed)
Report called to [place], Charity fundraiser. Patient being transferred to tele, patient alert, oriented and in stable condition at the time of transport. Patient being transported on oxygen in wheelchair.

## 2012-02-16 NOTE — Care Management Note (Signed)
    Page 1 of 2   02/20/2012     11:51:24 AM   CARE MANAGEMENT NOTE 02/20/2012  Patient:  [name],[name]   Account Number:  [id number]  Date Initiated:  02/16/2012  Documentation initiated by:  Sharrie Rothman  Subjective/Objective Assessment:   Pt admitted from home with COPD. Pt lives with her son and will return home at discharge. Pt has O2 with [facility] and [name] cane for prn use.     Action/Plan:   DC home with [facility] RN. Kathie Rhodes with [employer] (Medicaid Case Worker) will also follow pt.   Anticipated DC Date:  02/20/2012   Anticipated DC Plan:  HOME W HOME HEALTH SERVICES      DC Planning Services  CM consult      Choice offered to / List presented to:          [facility] arranged  HH-1 RN  HH-10 DISEASE MANAGEMENT      HH agency  [facility]   Status of service:  Completed, signed off Medicare Important Message given?   (If response is "NO", the following Medicare IM given date fields will be blank) Date Medicare IM given:   Date Additional Medicare IM given:    Discharge Disposition:  HOME W HOME HEALTH SERVICES  Per UR Regulation:    If discussed at Long Length of Stay Meetings, dates discussed:   02/20/2012    Comments:  02/20/12 1100 Nataly Pacifico Leanord Hawking RN BSN CM DC with [facility] RN. Also will be followed by Nuala Alpha with [facility].  02/19/12 Rosemary Holms RN BNS CM  02/16/12 1550 Arlyss Queen, RN BSN CM

## 2012-02-17 ENCOUNTER — Inpatient Hospital Stay (HOSPITAL_COMMUNITY): Payer: Medicaid Other

## 2012-02-17 DIAGNOSIS — R0789 Other chest pain: Secondary | ICD-10-CM

## 2012-02-17 LAB — GLUCOSE, CAPILLARY
Glucose-Capillary: 231 mg/dL — ABNORMAL HIGH (ref 70–99)
Glucose-Capillary: 200 mg/dL — ABNORMAL HIGH (ref 70–99)
Glucose-Capillary: 249 mg/dL — ABNORMAL HIGH (ref 70–99)
Glucose-Capillary: 160 mg/dL — ABNORMAL HIGH (ref 70–99)
Glucose-Capillary: 205 mg/dL — ABNORMAL HIGH (ref 70–99)

## 2012-02-17 LAB — TROPONIN I: Troponin I: <0.3 ng/mL (ref <0.30)

## 2012-02-17 MED ORDER — IOHEXOL 350 MG/ML SOLN
100.0000 mL | Freq: Once | INTRAVENOUS | Status: AC | PRN
  Administered 2012-02-17: 100 mL via INTRAVENOUS

## 2012-02-17 MED ORDER — BIOTENE DRY MOUTH MT LIQD
15.0000 mL | Freq: Two times a day (BID) | OROMUCOSAL | Status: DC
Start: 2012-02-17 — End: 2012-02-20
  Administered 2012-02-17 – 2012-02-20 (×7): 15 mL via OROMUCOSAL

## 2012-02-17 MED ORDER — INSULIN ASPART 100 UNIT/ML ~~LOC~~ SOLN
8.0000 [IU] | Freq: Three times a day (TID) | SUBCUTANEOUS | Status: DC
  Administered 2012-02-17 – 2012-02-18 (×3): 8 [IU] via SUBCUTANEOUS

## 2012-02-17 MED ORDER — FUROSEMIDE 10 MG/ML IJ SOLN
10.0000 mg | Freq: Once | INTRAMUSCULAR | Status: AC
  Administered 2012-02-17: 10 mg via INTRAVENOUS
  Filled 2012-02-17: qty 2

## 2012-02-17 MED ORDER — INSULIN GLARGINE 100 UNIT/ML ~~LOC~~ SOLN
20.0000 [IU] | Freq: Two times a day (BID) | SUBCUTANEOUS | Status: DC
  Administered 2012-02-17 (×2): 20 [IU] via SUBCUTANEOUS

## 2012-02-17 NOTE — Progress Notes (Addendum)
Subjective: The patient still has a lot of chest congestion and coughing, but feels a little bit better overall. Cough medications are helping. She has chest tightness centrally.  Objective: Vital signs in last 24 hours: Filed Vitals:   02/17/12 0228 02/17/12 0254 02/17/12 0500 02/17/12 0729  BP: 158/80  155/78   Pulse: 124  106   Temp: 97.6 F (36.4 C)  97.6 F (36.4 C)   TempSrc: Oral  Oral   Resp: 18  20   Height:      Weight:   117.3 kg (258 lb 9.6 oz)   SpO2: 96% 94% 82% 95%    Intake/Output Summary (Last 24 hours) at 02/17/12 0848 Last data filed at 02/17/12 0600  Gross per 24 hour  Intake 2196.67 ml  Output    600 ml  Net 1596.67 ml    Weight change: -2.7 kg (-5 lb 15.2 oz)  Physical exam: General: Obese 46 year old Caucasian woman sitting up in bed, in no acute distress. Overall, she looks better. Lungs: Diffuse bilateral rhonchus wheezes and occasional crackles. No tachypnea. No respiratory distress. Heart: Distant S1, S2, with mild tachycardia. Abdomen: Obese, positive bowel sounds, soft, nontender, nondistended. Extremities: No pedal edema.  Lab Results: Basic Metabolic Panel:  Basename 02/15/12 0404  NA 140  K 3.9  CL 101  CO2 26  GLUCOSE 158*  BUN 9  CREATININE 0.51  CALCIUM 9.0  MG --  PHOS --   Liver Function Tests: No results found for this basename: AST:2,ALT:2,ALKPHOS:2,BILITOT:2,PROT:2,ALBUMIN:2 in the last 72 hours No results found for this basename: LIPASE:2,AMYLASE:2 in the last 72 hours No results found for this basename: AMMONIA:2 in the last 72 hours CBC:  Basename 02/15/12 0404  WBC 29.6*  NEUTROABS --  HGB 10.4*  HCT 35.3*  MCV 74.0*  PLT 328   Cardiac Enzymes: No results found for this basename: CKTOTAL:3,CKMB:3,CKMBINDEX:3,TROPONINI:3 in the last 72 hours BNP: No results found for this basename: PROBNP:3 in the last 72 hours D-Dimer: No results found for this basename: DDIMER:2 in the last 72 hours CBG:  Basename  02/17/12 0709 02/16/12 2032 02/16/12 1621 02/16/12 1132 02/16/12 0732 02/15/12 2115  GLUCAP 200* 231* 177* 169* 174* 266*   Hemoglobin A1C:  Basename 02/15/12 0404  HGBA1C 6.8*   Fasting Lipid Panel: No results found for this basename: CHOL,HDL,LDLCALC,TRIG,CHOLHDL,LDLDIRECT in the last 72 hours Thyroid Function Tests: No results found for this basename: TSH,T4TOTAL,FREET4,T3FREE,THYROIDAB in the last 72 hours Anemia Panel: No results found for this basename: VITAMINB12,FOLATE,FERRITIN,TIBC,IRON,RETICCTPCT in the last 72 hours Coagulation: No results found for this basename: LABPROT:2,INR:2 in the last 72 hours Urine Drug Screen: Drugs of Abuse     Component Value Date/Time   LABOPIA NONE DETECTED 01/23/2012 1823   COCAINSCRNUR NONE DETECTED 01/23/2012 1823   LABBENZ NONE DETECTED 01/23/2012 1823   AMPHETMU NONE DETECTED 01/23/2012 1823   THCU POSITIVE* 01/23/2012 1823   LABBARB NONE DETECTED 01/23/2012 1823    Alcohol Level: No results found for this basename: ETH:2 in the last 72 hours Urinalysis: No results found for this basename: COLORURINE:2,APPERANCEUR:2,LABSPEC:2,PHURINE:2,GLUCOSEU:2,HGBUR:2,BILIRUBINUR:2,KETONESUR:2,PROTEINUR:2,UROBILINOGEN:2,NITRITE:2,LEUKOCYTESUR:2 in the last 72 hours Misc. Labs:   Micro: Recent Results (from the past 240 hour(s))  MRSA PCR SCREENING     Status: Normal   Collection Time   02/13/12  7:44 PM      Component Value Range Status Comment   MRSA by PCR NEGATIVE  NEGATIVE Final     Studies/Results: No results found.  Medications:  Scheduled:    . albuterol  2.5 mg Nebulization Q4H WA  . antiseptic oral rinse  15 mL Mouth Rinse BID  . azithromycin  500 mg Intravenous Q24H  . cefTRIAXone (ROCEPHIN)  IV  1 g Intravenous Q24H  . citalopram  20 mg Oral q morning - 10a  . dextromethorphan-guaiFENesin  1 tablet Oral BID  . docusate sodium  100 mg Oral BID  . enoxaparin (LOVENOX) injection  40 mg Subcutaneous Q24H  . fluticasone  2  spray Each Nare Daily  . furosemide  10 mg Intravenous Q12H  . gabapentin  300 mg Oral TID  . insulin aspart  0-20 Units Subcutaneous TID WC  . insulin aspart  0-5 Units Subcutaneous QHS  . insulin aspart  5 Units Subcutaneous TID WC  . insulin glargine  15 Units Subcutaneous BID  . ipratropium      . ipratropium  0.5 mg Nebulization Q4H WA  . irbesartan  75 mg Oral Daily  . magnesium sulfate 1 - 4 g bolus IVPB  2 g Intravenous Once  . metFORMIN  1,000 mg Oral BID WC  . methylPREDNISolone (SOLU-MEDROL) injection  80 mg Intravenous Q6H  . mometasone-formoterol  2 puff Inhalation BID  . nicotine  21 mg Transdermal Daily  . nystatin  500,000 Units Oral QID  . pantoprazole  40 mg Oral Daily  . potassium chloride  20 mEq Oral BID  . sodium chloride  3 mL Intravenous Q12H  . DISCONTD: insulin aspart  3 Units Subcutaneous TID WC   Continuous:    . 0.9 % NaCl with KCl 20 mEq / L 50 mL/hr at 02/16/12 2223   ZOX:[id number], albuterol, ALPRAZolam, bisacodyl, chlorpheniramine-HYDROcodone, guaiFENesin-dextromethorphan, HYDROmorphone (DILAUDID) injection, ondansetron (ZOFRAN) IV, oxyCODONE, zolpidem  Assessment: Principal Problem:  *Acute-on-chronic respiratory failure Active Problems:  Microcytic anemia  HTN (hypertension)  DM type 2 (diabetes mellitus, type 2)  Tobacco abuse  Hyponatremia  Depression  Morbid obesity  Back pain  COPD exacerbation  Bell's palsy  Hypokalemia   1. Acute on chronic hypoxic respiratory failure secondary to oxygen-dependent COPD with exacerbation. She is oxygenating 95-97% on 4 L of nasal cannula oxygen which is better, but obviously she is to requiring high dose of oxygen. She has rhonchus wheezing, which may be an indication that her airways opening up more. Mucinex and Tussionex were started. She was given 2 g of magnesium sulfate empirically and small dosing of IV Lasix times one day on 02/16/2012. Not sure if these made a difference. We'll continue  oxygen, Dulera inhaler, Solu-Medrol, albuterol/Atrovent nebulizers, and antibiotics. Apparently she was given Levaquin in the emergency department. We'll continue azithromycin and Rocephin.   Chest tightness. This may simply be the consequence of bronchospasm/COPD with exacerbation. However will assess further.  Tobacco abuse. The patient was strongly advised to stop smoking. Continue nicotine replacement therapy.  Hypertension. Patient's blood pressures trending up, which may be the consequence of steroid therapy. Continue ARB. we'll continue to monitor for adjustments if needed.   Type 2 diabetes mellitus. Her capillary blood glucose is better with adjustment in sliding scale NovoLog and Lantus. Mealtime coverage with NovoLog was started per recommendation by the diabetes coordinator. Hemoglobin A1c is 6.8.  Hypokalemia. Repleted/supplemented.  Hyponatremia. Resolved on gentle IV fluid hydration.  Chronic back pain. We'll continue Neurontin and opiate analgesics as needed.  Chronic microcytic anemia. Stable. She has a history of gastric erosions and internal hemorrhoids. We'll continue Protonix.  Leukocytosis. Steroid-induced.   Plan:  1. We'll order a CT angiogram of her chest  to rule out PE and/or other inflammatory/infectious process. 2. In light of the pending IV contrast, will discontinue metformin. Will increase Lantus dosing. Will increase mealtime coverage of NovoLog. 3.We'll order laboratory studies in the morning.     LOS: 4 days   [name] 02/17/2012, 8:48 AM

## 2012-02-18 LAB — CBC
HCT: 33.1 % — ABNORMAL LOW (ref 36.0–46.0)
Hemoglobin: 9.8 g/dL — ABNORMAL LOW (ref 12.0–15.0)
MCH: 21.8 pg — ABNORMAL LOW (ref 26.0–34.0)
MCHC: 29.6 g/dL — ABNORMAL LOW (ref 30.0–36.0)
MCV: 73.7 fL — ABNORMAL LOW (ref 78.0–100.0)
Platelets: 261 10*3/uL (ref 150–400)
RBC: 4.49 MIL/uL (ref 3.87–5.11)
RDW: 18.1 % — ABNORMAL HIGH (ref 11.5–15.5)
WBC: 15.6 10*3/uL — ABNORMAL HIGH (ref 4.0–10.5)

## 2012-02-18 LAB — GLUCOSE, CAPILLARY
Glucose-Capillary: 282 mg/dL — ABNORMAL HIGH (ref 70–99)
Glucose-Capillary: 173 mg/dL — ABNORMAL HIGH (ref 70–99)
Glucose-Capillary: 199 mg/dL — ABNORMAL HIGH (ref 70–99)
Glucose-Capillary: 225 mg/dL — ABNORMAL HIGH (ref 70–99)

## 2012-02-18 LAB — BASIC METABOLIC PANEL
BUN: 16 mg/dL (ref 6–23)
CO2: 33 mEq/L — ABNORMAL HIGH (ref 19–32)
Calcium: 9 mg/dL (ref 8.4–10.5)
Chloride: 93 mEq/L — ABNORMAL LOW (ref 96–112)
Creatinine, Ser: 0.52 mg/dL (ref 0.50–1.10)
GFR calc Af Amer: >90 mL/min (ref >90)
GFR calc non Af Amer: >90 mL/min (ref >90)
Glucose, Bld: 317 mg/dL — ABNORMAL HIGH (ref 70–99)
Potassium: 4.2 mEq/L (ref 3.5–5.1)
Sodium: 134 mEq/L — ABNORMAL LOW (ref 135–145)

## 2012-02-18 LAB — PRO B NATRIURETIC PEPTIDE: Pro B Natriuretic peptide (BNP): 485 pg/mL — ABNORMAL HIGH (ref 0–125)

## 2012-02-18 MED ORDER — METHYLPREDNISOLONE SODIUM SUCC 125 MG IJ SOLR
80.0000 mg | Freq: Three times a day (TID) | INTRAMUSCULAR | Status: DC
  Administered 2012-02-18 – 2012-02-19 (×3): 80 mg via INTRAVENOUS
  Filled 2012-02-18 (×2): qty 2

## 2012-02-18 MED ORDER — SODIUM CHLORIDE 0.9 % IJ SOLN
INTRAMUSCULAR | Status: AC
  Administered 2012-02-18: 10 mL
  Filled 2012-02-18: qty 3

## 2012-02-18 MED ORDER — AZITHROMYCIN 250 MG PO TABS
500.0000 mg | ORAL_TABLET | Freq: Every day | ORAL | Status: DC
  Administered 2012-02-19: 500 mg via ORAL
  Filled 2012-02-18: qty 2

## 2012-02-18 MED ORDER — INSULIN ASPART 100 UNIT/ML ~~LOC~~ SOLN
10.0000 [IU] | Freq: Three times a day (TID) | SUBCUTANEOUS | Status: DC
  Administered 2012-02-18 – 2012-02-20 (×6): 10 [IU] via SUBCUTANEOUS

## 2012-02-18 MED ORDER — FLUCONAZOLE 100 MG PO TABS
150.0000 mg | ORAL_TABLET | Freq: Every day | ORAL | Status: AC
  Administered 2012-02-18 – 2012-02-19 (×2): 150 mg via ORAL
  Filled 2012-02-18 (×2): qty 1
  Filled 2012-02-18: qty 2

## 2012-02-18 MED ORDER — BENZONATATE 100 MG PO CAPS
100.0000 mg | ORAL_CAPSULE | Freq: Three times a day (TID) | ORAL | Status: DC
  Administered 2012-02-18 – 2012-02-20 (×7): 100 mg via ORAL
  Filled 2012-02-18 (×8): qty 1

## 2012-02-18 MED ORDER — INSULIN GLARGINE 100 UNIT/ML ~~LOC~~ SOLN
25.0000 [IU] | Freq: Two times a day (BID) | SUBCUTANEOUS | Status: DC
  Administered 2012-02-18 – 2012-02-19 (×4): 25 [IU] via SUBCUTANEOUS

## 2012-02-18 NOTE — Progress Notes (Signed)
PHARMACIST - PHYSICIAN COMMUNICATION DR:   TRH CONCERNING: Antibiotic IV to Oral Route Change Policy  RECOMMENDATION: This patient is receiving Zithromax by the intravenous route.  Based on criteria approved by the Pharmacy and Therapeutics Committee, the antibiotic(s) is/are being converted to the equivalent oral dose form(s).   DESCRIPTION: These criteria include:  Patient being treated for a respiratory tract infection, urinary tract infection, or cellulitis  The patient is not neutropenic and does not exhibit a GI malabsorption state  The patient is eating (either orally or via tube) and/or has been taking other orally administered medications for a least 24 hours  The patient is improving clinically and has a Tmax < 100.5  If you have questions about this conversion, please contact the Pharmacy Department  [x]   [phone] )  [name] []   [phone] )  [facility]  []   [phone] )  [facility] []   [phone] )  [facility]  Mady Gemma, [facility] 02/18/2012 9:12 AM

## 2012-02-18 NOTE — Progress Notes (Addendum)
Subjective: The patient said that she had a better night. She slept through the night without Ambien. She is just now feeling that she has less chest congestion. She is still coughing a lot, mostly at night. Tussionex is helping. She complains of a sore mouth and is wondering if she is developing thrush.   Objective: Vital signs in last 24 hours: Filed Vitals:   02/18/12 0320 02/18/12 0418 02/18/12 0630 02/18/12 0757  BP: 150/60  134/49   Pulse: 88     Temp: 97.3 F (36.3 C)  97.3 F (36.3 C)   TempSrc: Axillary  Oral   Resp: 22  20   Height:      Weight:  123.787 kg (272 lb 14.4 oz)    SpO2: 93%  96% 94%    Intake/Output Summary (Last 24 hours) at 02/18/12 0854 Last data filed at 02/18/12 0524  Gross per 24 hour  Intake 1987.5 ml  Output   1300 ml  Net  687.5 ml    Weight change: 6.486 kg (14 lb 4.8 oz)  Physical exam: Oral pharynx: Faint white exudate on tongue. General: Obese 46 year old Caucasian woman sitting up in bed, in no acute distress. Overall, she looks better. Lungs: Fewer bilateral wheezes and less rhonchus breath sounds. Heart: Distant S1, S2, with mild tachycardia. Abdomen: Obese, positive bowel sounds, soft, nontender, nondistended. Extremities: No pedal edema.  Lab Results: Basic Metabolic Panel:  Basename 02/18/12 0601  NA 134*  K 4.2  CL 93*  CO2 33*  GLUCOSE 317*  BUN 16  CREATININE 0.52  CALCIUM 9.0  MG --  PHOS --   Liver Function Tests: No results found for this basename: AST:2,ALT:2,ALKPHOS:2,BILITOT:2,PROT:2,ALBUMIN:2 in the last 72 hours No results found for this basename: LIPASE:2,AMYLASE:2 in the last 72 hours No results found for this basename: AMMONIA:2 in the last 72 hours CBC:  Basename 02/18/12 0601  WBC 15.6*  NEUTROABS --  HGB 9.8*  HCT 33.1*  MCV 73.7*  PLT 261   Cardiac Enzymes:  Basename 02/17/12 1026  CKTOTAL --  CKMB --  CKMBINDEX --  TROPONINI <0.30   BNP:  Basename 02/18/12 0601  PROBNP 485.0*    D-Dimer: No results found for this basename: DDIMER:2 in the last 72 hours CBG:  Basename 02/18/12 0718 02/17/12 2120 02/17/12 1729 02/17/12 1135 02/17/12 0709 02/16/12 2032  GLUCAP 282* 205* 160* 249* 200* 231*   Hemoglobin A1C: No results found for this basename: HGBA1C in the last 72 hours Fasting Lipid Panel: No results found for this basename: CHOL,HDL,LDLCALC,TRIG,CHOLHDL,LDLDIRECT in the last 72 hours Thyroid Function Tests: No results found for this basename: TSH,T4TOTAL,FREET4,T3FREE,THYROIDAB in the last 72 hours Anemia Panel: No results found for this basename: VITAMINB12,FOLATE,FERRITIN,TIBC,IRON,RETICCTPCT in the last 72 hours Coagulation: No results found for this basename: LABPROT:2,INR:2 in the last 72 hours Urine Drug Screen: Drugs of Abuse     Component Value Date/Time   LABOPIA NONE DETECTED 01/23/2012 1823   COCAINSCRNUR NONE DETECTED 01/23/2012 1823   LABBENZ NONE DETECTED 01/23/2012 1823   AMPHETMU NONE DETECTED 01/23/2012 1823   THCU POSITIVE* 01/23/2012 1823   LABBARB NONE DETECTED 01/23/2012 1823    Alcohol Level: No results found for this basename: ETH:2 in the last 72 hours Urinalysis: No results found for this basename: COLORURINE:2,APPERANCEUR:2,LABSPEC:2,PHURINE:2,GLUCOSEU:2,HGBUR:2,BILIRUBINUR:2,KETONESUR:2,PROTEINUR:2,UROBILINOGEN:2,NITRITE:2,LEUKOCYTESUR:2 in the last 72 hours Misc. Labs:   Micro: Recent Results (from the past 240 hour(s))  MRSA PCR SCREENING     Status: Normal   Collection Time   02/13/12  7:44 PM  Component Value Range Status Comment   MRSA by PCR NEGATIVE  NEGATIVE Final     Studies/Results: Ct Angio Chest Pe W/cm &/or Wo Cm  02/17/2012  *RADIOLOGY REPORT*  Clinical Data: Chest tightness, short of breath, COPD  CT ANGIOGRAPHY CHEST  Technique:  Multidetector CT imaging of the chest using the standard protocol during bolus administration of intravenous contrast. Multiplanar reconstructed images including MIPs were  obtained and reviewed to evaluate the vascular anatomy.  Contrast: OMNIPAQUE IOHEXOL 350 MG/ML SOLN  Comparison: The CT 05/05/2011  Findings: There are no filling defects within the pulmonary arteries to suggest acute pulmonary embolism.  No acute findings of the aorta or great vessels.  No pericardial fluid.  There are small lymph nodes in the prevascular space which are unchanged from prior.  Small paratracheal lymph nodes are also unchanged. Esophagus is normal.  Review of the lung parenchyma demonstrates diffuse ground-glass opacities predominately within the upper lobes but also extending to the lower lobes.  These findings were present on CT of 05/05/2011 but are worse on today's scan.  No pleural fluid.   Limited view of the upper outer is unremarkable. Limited view of the skeleton is unremarkable.  IMPRESSION:  1.  No evidence of acute pulmonary embolism. 2.  Diffuse ground-glass opacities with an upper lobe predominance suggest pulmonary edema.  Cannot exclude an inflammatory or infectious process.  Findings are similar to but worse than comparison exam of 05/05/2011.   Original Report Authenticated By: Genevive Bi, M.D.     Medications:  Scheduled:    . albuterol  2.5 mg Nebulization Q4H WA  . antiseptic oral rinse  15 mL Mouth Rinse BID  . azithromycin  500 mg Intravenous Q24H  . cefTRIAXone (ROCEPHIN)  IV  1 g Intravenous Q24H  . citalopram  20 mg Oral q morning - 10a  . dextromethorphan-guaiFENesin  1 tablet Oral BID  . docusate sodium  100 mg Oral BID  . enoxaparin (LOVENOX) injection  40 mg Subcutaneous Q24H  . fluticasone  2 spray Each Nare Daily  . furosemide  10 mg Intravenous Once  . gabapentin  300 mg Oral TID  . insulin aspart  0-20 Units Subcutaneous TID WC  . insulin aspart  0-5 Units Subcutaneous QHS  . insulin aspart  8 Units Subcutaneous TID WC  . insulin glargine  20 Units Subcutaneous BID  . ipratropium  0.5 mg Nebulization Q4H WA  . irbesartan  75 mg Oral  Daily  . methylPREDNISolone (SOLU-MEDROL) injection  80 mg Intravenous Q6H  . mometasone-formoterol  2 puff Inhalation BID  . nicotine  21 mg Transdermal Daily  . nystatin  500,000 Units Oral QID  . pantoprazole  40 mg Oral Daily  . sodium chloride  3 mL Intravenous Q12H  . sodium chloride      . DISCONTD: insulin aspart  5 Units Subcutaneous TID WC  . DISCONTD: insulin glargine  15 Units Subcutaneous BID  . DISCONTD: metFORMIN  1,000 mg Oral BID WC   Continuous:    . 0.9 % NaCl with KCl 20 mEq / L 20 mL/hr (02/17/12 2054)   GNF:[id number], albuterol, ALPRAZolam, bisacodyl, chlorpheniramine-HYDROcodone, guaiFENesin-dextromethorphan, HYDROmorphone (DILAUDID) injection, iohexol, ondansetron (ZOFRAN) IV, oxyCODONE, zolpidem  Assessment: Principal Problem:  *Acute-on-chronic respiratory failure Active Problems:  Microcytic anemia  HTN (hypertension)  DM type 2 (diabetes mellitus, type 2)  Tobacco abuse  Hyponatremia  Depression  Morbid obesity  Back pain  COPD exacerbation  Bell's palsy  Hypokalemia  Chest  tightness   1. Acute on chronic hypoxic respiratory failure secondary to oxygen-dependent COPD with exacerbation. She is starting to oxygenate better on her standard home dose of 3 L of supplemental oxygen. CT angiogram of her chest revealed no PE. It also revealed diffuse groundglass opacities, query pulmonary edema. Not overly convinced that this is pulmonary edema but rather inflammatory/infectious process. Nevertheless, she was given small doses of IV Lasix over the past 24 hours, which could have helped. Her pro BNP is not impressively elevated. However, her mother has a history of congestive heart failure We'll continue oxygen, Mucinex, Tussionex, Dulera inhaler, Solu-Medrol, albuterol/Atrovent nebulizers, and antibiotics. Apparently she was given Levaquin in the emergency department. We'll continue azithromycin and Rocephin. Of note, she was given 2 g of magnesium sulfate  as well.  Chest tightness. This may simply be the consequence of bronchospasm/COPD with exacerbation. Her troponin I is normal. CT angiogram of her chest was negative for PE.  Tobacco abuse. The patient was strongly advised to stop smoking. Continue nicotine replacement therapy.  Hypertension. Patient's blood pressures are stabilizing. Continue ARB. we'll continue to monitor for adjustments if needed.   Type 2 diabetes mellitus. Her capillary blood glucose is slightly worsening off of metformin being held because of the IV contrast yesterday. Will adjust adjustment in sliding scale NovoLog and Lantus accordingly. Hemoglobin A1c is 6.8.  Hypokalemia. Repleted/supplemented.  Hyponatremia. Resolved on gentle IV fluid hydration, but now slightly low.  Chronic back pain. We'll continue Neurontin and opiate analgesics as needed.  Chronic microcytic anemia. Stable. She has a history of gastric erosions and internal hemorrhoids. We'll continue Protonix.  Leukocytosis. Steroid-induced. White blood cell count is trending down.   Plan:  1. We'll increase Lantus and sliding-scale NovoLog and absence of metformin. 2. Order 2-D echocardiogram for further evaluation of her LV/RV function. 3. Last nursing staff to ambulate the patient. We'll encourage her to gradually increase her activity level. 4. Will decrease Solu-Medrol from 80 mg every 6 hours to 80 mg every 8 hours. 5. Diflucan for thrush. 6. Anticipate that the patient will be stable enough for discharge and 2-3 days.     LOS: 5 days   [name] 02/18/2012, 8:54 AM

## 2012-02-19 DIAGNOSIS — F17200 Nicotine dependence, unspecified, uncomplicated: Secondary | ICD-10-CM

## 2012-02-19 DIAGNOSIS — J984 Other disorders of lung: Secondary | ICD-10-CM

## 2012-02-19 LAB — GLUCOSE, CAPILLARY
Glucose-Capillary: 232 mg/dL — ABNORMAL HIGH (ref 70–99)
Glucose-Capillary: 285 mg/dL — ABNORMAL HIGH (ref 70–99)
Glucose-Capillary: 149 mg/dL — ABNORMAL HIGH (ref 70–99)
Glucose-Capillary: 162 mg/dL — ABNORMAL HIGH (ref 70–99)

## 2012-02-19 MED ORDER — LEVOFLOXACIN 250 MG PO TABS
750.0000 mg | ORAL_TABLET | Freq: Every day | ORAL | Status: DC
  Administered 2012-02-19 – 2012-02-20 (×2): 750 mg via ORAL
  Filled 2012-02-19 (×2): qty 1

## 2012-02-19 MED ORDER — PREDNISONE 20 MG PO TABS
40.0000 mg | ORAL_TABLET | Freq: Every day | ORAL | Status: DC
  Administered 2012-02-20: 40 mg via ORAL
  Filled 2012-02-19: qty 2

## 2012-02-19 NOTE — Progress Notes (Signed)
Inpatient Diabetes Program Recommendations  AACE/ADA: New Consensus Statement on Inpatient Glycemic Control (2013)  Target Ranges:  Prepandial:   less than 140 mg/dL      Peak postprandial:   less than 180 mg/dL (1-2 hours)      Critically ill patients:  140 - 180 mg/dL  Results for CHARMA, MOCARSKI (MRN [id number]) as of 02/19/2012 12:16  Ref. Range 02/18/2012 16:45 02/18/2012 21:01 02/19/2012 07:48 02/19/2012 11:58  Glucose-Capillary Latest Range: 70-99 mg/dL 409 (H) 811 (H) 914 (H) 285 (H)     Inpatient Diabetes Program Recommendations Insulin - Basal: Please consider increasing Lantus to 30 units BID. Insulin - Meal Coverage: .  Note: Will continue to follow. Thanks, Orlando Penner, RN, BSN, CCRN Diabetes Coordinator Inpatient Diabetes Program [phone]

## 2012-02-19 NOTE — Progress Notes (Signed)
Ambulated patient in hall on 3L of O2, prior to ambulation O2 sat 99%, during ambulation O2 ranged between 94-97%, towards end of ambulation - O2 dropped to 90%, post ambulation O2 sat 97% on 3L, patient tolerated ambulation well, no s/s of distress noted.

## 2012-02-19 NOTE — Progress Notes (Signed)
[facility]             Progress Note   Subjective: Patient reports continued improvement in her breathing.  She is still coughing but having difficulty bringing up sputum.  She feels like she is overall improving.  Objective: Vital signs in last 24 hours: Temp:  [97.1 F (36.2 C)-98.3 F (36.8 C)] 98.3 F (36.8 C) (10/28 0631) Pulse Rate:  [79-99] 90  (10/28 0631) Resp:  [20] 20  (10/28 0631) BP: (121-167)/(68-81) 121/68 mmHg (10/28 0631) SpO2:  [95 %-98 %] 97 % (10/28 0748) Weight:  [123.469 kg (272 lb 3.2 oz)] 123.469 kg (272 lb 3.2 oz) (10/28 0631) Weight change: -0.318 kg (-11.2 oz) Last BM Date: 02/19/12  Intake/Output from previous day: 10/27 0701 - 10/28 0700 In: 1965 [P.O.:1440; I.V.:475; IV Piggyback:50] Out: 1551 [Urine:1550; Stool:1]     Physical Exam: General: Alert, awake, oriented x3, in no acute distress. HEENT: No bruits, no goiter. Heart: Regular rate and rhythm, without murmurs, rubs, gallops. Lungs: b/l exp wheezes. Abdomen: Soft, nontender, nondistended, positive bowel sounds. Extremities: No clubbing cyanosis or edema with positive pedal pulses. Neuro: Grossly intact, nonfocal.    Lab Results: Basic Metabolic Panel:  Basename 02/18/12 0601  NA 134*  K 4.2  CL 93*  CO2 33*  GLUCOSE 317*  BUN 16  CREATININE 0.52  CALCIUM 9.0  MG --  PHOS --   Liver Function Tests: No results found for this basename: AST:2,ALT:2,ALKPHOS:2,BILITOT:2,PROT:2,ALBUMIN:2 in the last 72 hours No results found for this basename: LIPASE:2,AMYLASE:2 in the last 72 hours No results found for this basename: AMMONIA:2 in the last 72 hours CBC:  Basename 02/18/12 0601  WBC 15.6*  NEUTROABS --  HGB 9.8*  HCT 33.1*  MCV 73.7*  PLT 261   Cardiac Enzymes:  Basename 02/17/12 1026  CKTOTAL --  CKMB --  CKMBINDEX --  TROPONINI <0.30   BNP:  Basename 02/18/12 0601  PROBNP 485.0*   D-Dimer: No results found for this basename: DDIMER:2 in the  last 72 hours CBG:  Basename 02/19/12 0748 02/18/12 2101 02/18/12 1645 02/18/12 1133 02/18/12 0718 02/17/12 2120  GLUCAP 232* 225* 199* 173* 282* 205*   Hemoglobin A1C: No results found for this basename: HGBA1C in the last 72 hours Fasting Lipid Panel: No results found for this basename: CHOL,HDL,LDLCALC,TRIG,CHOLHDL,LDLDIRECT in the last 72 hours Thyroid Function Tests: No results found for this basename: TSH,T4TOTAL,FREET4,T3FREE,THYROIDAB in the last 72 hours Anemia Panel: No results found for this basename: VITAMINB12,FOLATE,FERRITIN,TIBC,IRON,RETICCTPCT in the last 72 hours Coagulation: No results found for this basename: LABPROT:2,INR:2 in the last 72 hours Urine Drug Screen: Drugs of Abuse     Component Value Date/Time   LABOPIA NONE DETECTED 01/23/2012 1823   COCAINSCRNUR NONE DETECTED 01/23/2012 1823   LABBENZ NONE DETECTED 01/23/2012 1823   AMPHETMU NONE DETECTED 01/23/2012 1823   THCU POSITIVE* 01/23/2012 1823   LABBARB NONE DETECTED 01/23/2012 1823    Alcohol Level: No results found for this basename: ETH:2 in the last 72 hours Urinalysis: No results found for this basename: COLORURINE:2,APPERANCEUR:2,LABSPEC:2,PHURINE:2,GLUCOSEU:2,HGBUR:2,BILIRUBINUR:2,KETONESUR:2,PROTEINUR:2,UROBILINOGEN:2,NITRITE:2,LEUKOCYTESUR:2 in the last 72 hours  Recent Results (from the past 240 hour(s))  MRSA PCR SCREENING     Status: Normal   Collection Time   02/13/12  7:44 PM      Component Value Range Status Comment   MRSA by PCR NEGATIVE  NEGATIVE Final     Studies/Results: No results found.  Medications: Scheduled Meds:   . albuterol  2.5 mg Nebulization Q4H WA  .  antiseptic oral rinse  15 mL Mouth Rinse BID  . azithromycin  500 mg Oral Daily  . benzonatate  100 mg Oral TID  . cefTRIAXone (ROCEPHIN)  IV  1 g Intravenous Q24H  . citalopram  20 mg Oral q morning - 10a  . dextromethorphan-guaiFENesin  1 tablet Oral BID  . docusate sodium  100 mg Oral BID  . enoxaparin  (LOVENOX) injection  40 mg Subcutaneous Q24H  . fluconazole  150 mg Oral Daily  . fluticasone  2 spray Each Nare Daily  . gabapentin  300 mg Oral TID  . insulin aspart  0-20 Units Subcutaneous TID WC  . insulin aspart  0-5 Units Subcutaneous QHS  . insulin aspart  10 Units Subcutaneous TID WC  . insulin glargine  25 Units Subcutaneous BID  . ipratropium  0.5 mg Nebulization Q4H WA  . irbesartan  75 mg Oral Daily  . methylPREDNISolone (SOLU-MEDROL) injection  80 mg Intravenous Q8H  . mometasone-formoterol  2 puff Inhalation BID  . nicotine  21 mg Transdermal Daily  . nystatin  500,000 Units Oral QID  . pantoprazole  40 mg Oral Daily  . sodium chloride  3 mL Intravenous Q12H   Continuous Infusions:   . 0.9 % NaCl with KCl 20 mEq / L 20 mL/hr (02/17/12 2054)   PRN Meds:.acetaminophen, albuterol, ALPRAZolam, bisacodyl, chlorpheniramine-HYDROcodone, guaiFENesin-dextromethorphan, HYDROmorphone (DILAUDID) injection, ondansetron (ZOFRAN) IV, oxyCODONE, zolpidem  Assessment/Plan:  Principal Problem:  *Acute-on-chronic respiratory failure Active Problems:  Microcytic anemia  HTN (hypertension)  DM type 2 (diabetes mellitus, type 2)  Tobacco abuse  Hyponatremia  Depression  Morbid obesity  Back pain  COPD exacerbation  Bell's palsy  Hypokalemia  Chest tightness  1. Acute on chronic respiratory failure due to exacerbation of COPD.  Patient has made slow improvement in her hospital course.  She is back to down to home oxygen level of 3L/min.  We will ambulate her today.  Change steroids to prednisone.  Change antibiotics to oral levaquin, continue nebulization treatments. Echocardiogram shows normal LV function, bnp only mildly elevated.   2. Tobacco abuse. Counseled on the importance of tobacco cessation  3. HTN, stable  4.  DM2, uncontrolled due to steroids, continue SSI, this should improve as steroids are tapered down.  5. Dispo.  Anticipate discharge home in the next 24 hours  if she continues to improve.  Time spent coordinating care:   LOS: 6 days   MEMON,JEHANZEB [facility] Pager: [phone] 02/19/2012, 11:38 AM

## 2012-02-19 NOTE — Progress Notes (Signed)
*  PRELIMINARY RESULTS* Echocardiogram 2D Echocardiogram has been performed.  Conrad  Springs 02/19/2012, 10:00 AM

## 2012-02-20 LAB — GLUCOSE, CAPILLARY
Glucose-Capillary: 73 mg/dL (ref 70–99)
Glucose-Capillary: 161 mg/dL — ABNORMAL HIGH (ref 70–99)

## 2012-02-20 LAB — CREATININE, SERUM
Creatinine, Ser: 0.73 mg/dL (ref 0.50–1.10)
GFR calc Af Amer: >90 mL/min (ref >90)
GFR calc non Af Amer: >90 mL/min (ref >90)

## 2012-02-20 MED ORDER — NICOTINE 21 MG/24HR TD PT24: 1.0000 | MEDICATED_PATCH | Freq: Every day | TRANSDERMAL | Status: DC

## 2012-02-20 MED ORDER — ALPRAZOLAM 0.5 MG PO TABS: 0.5000 mg | ORAL_TABLET | Freq: Two times a day (BID) | ORAL | Status: DC | PRN

## 2012-02-20 MED ORDER — PNEUMOCOCCAL VAC POLYVALENT 25 MCG/0.5ML IJ INJ
0.5000 mL | INJECTION | Freq: Once | INTRAMUSCULAR | Status: AC
  Administered 2012-02-20: 0.5 mL via INTRAMUSCULAR
  Filled 2012-02-20: qty 0.5

## 2012-02-20 MED ORDER — PREDNISONE 10 MG PO TABS: ORAL_TABLET | ORAL | Status: DC

## 2012-02-20 NOTE — Discharge Summary (Signed)
Physician Discharge Summary  [name]:[id number] DOB: Nov 25, 1965 DOA: 02/13/2012  PCP: Milinda Antis, MD  Admit date: 02/13/2012 Discharge date: 02/20/2012  Time spent: 40 minutes  Recommendations for Outpatient Follow-up:  1. [facility] with [facility] 2. Follow up with primary care doctor in 2 weeks  Discharge Diagnoses:  Principal Problem:  *Acute-on-chronic respiratory failure Active Problems:  Microcytic anemia  HTN (hypertension)  DM type 2 (diabetes mellitus, type 2)  Tobacco abuse  Hyponatremia  Depression  Morbid obesity  Back pain  COPD exacerbation  Bell's palsy  Hypokalemia  Chest tightness   Discharge Condition: improved  Diet recommendation: low salt, low carb  Filed Weights   02/18/12 0418 02/19/12 0631 02/20/12 0500  Weight: 123.787 kg (272 lb 14.4 oz) 123.469 kg (272 lb 3.2 oz) 125.147 kg (275 lb 14.4 oz)    History of present illness:  [name] is an 46 y.o. obese Caucasian female, with a history of multiple episodes of wheezing with respiratory illness during childhood, now a chronic tobacco smoker, and a diagnosis of COPD, maintained on home oxygen. She has her own oximeter at home and tends to wear her oxygen only when the readings are low.  Yesterday morning she woke with chest tightness and shortness of breath and cough and despite the use of her nebulizer has gotten progressively worse to the point where today she has to wear oxygen at 4 L per minute to keep her O2 sats at 90% while awake , and when she wakes from sleep she feels much worse as if her oxygen level has been very low during sleep. Eventually she decided to come to the emergency room for evaluation, and despite our long nebulization she continues to wheeze and r remains hypoxic on 6 L/min.  Cough is productive of white, she has been having a fever but no focal chest pain except with coughing.  She had her flu shot 3 weeks ago.    Also about 3 weeks ago she developed a Bell's palsy treated with one week steroid taper.  One week ago she developed otitis externa and was treated with polymyxin neomycin ear drops    Hospital Course:  This lady was admitted to the hospital with shortness of breath which was felt to be due to a copd exacerbation.  She was admitted to the stepdown unit due to increased oxygen requirements on 6L of oxygen.  She was started on standard therapy with steroids, abx, and nebulizer treatment. Patient was slow to progress but was able to wean down to her baseline level of oxygen at 3L.   She feels significantly improved.  Her cough is better, she is able to ambulate in the hall to the nursing station without any difficulties and maintains oxygen saturations above 90%. She will be placed on a steroid taper.  She was strongly advised to quit smoking and will receive patches.  She has been [facility] with the [facility].  She will follow up with primary care doctor in 2 weeks.  Procedures:  2d echo shows EF 60-65%  Consultations:  none  Discharge Exam: Filed Vitals:   02/20/12 0553 02/20/12 0720 02/20/12 0814 02/20/12 0933  BP:    134/65  Pulse:    95  Temp:    98.5 F (36.9 C)  TempSrc:    Oral  Resp:    18  Height:      Weight:  SpO2: 96% 98% 98% 100%    General: NAD Cardiovascular: s1, s2, rrr Respiratory: mild exp wheeze b/l  Discharge Instructions  Discharge Orders    Future Appointments: Provider: Department: Dept Phone: Center:   03/14/2012 1:30 PM Salley Scarlet, MD [facility] [phone] [facility]     Future Orders Please Complete By Expires   Diet - low sodium heart healthy      Diet Carb Modified      Home Health      Questions: Responses:   To provide the following care/treatments RN   Face-to-face encounter      Comments:   I Jenna Routzahn certify that this patient is under my care and that I, or a nurse practitioner or physician's  assistant working with me, had a face-to-face encounter that meets the physician face-to-face encounter requirements with this patient on 02/20/2012.   Questions: Responses:   The encounter with the patient was in whole, or in part, for the following medical condition, which is the primary reason for home health care copd exacerbation   I certify that, based on my findings, the following services are medically necessary home health services Nursing   My clinical findings support the need for the above services Acute exacerbation of COPD   Further, I certify that my clinical findings support that this patient is homebound due to: Shortness of Breath with activity   To provide the following care/treatments RN   Increase activity slowly      Call MD for:  temperature >100.4      Call MD for:  difficulty breathing, headache or visual disturbances          Medication List     As of 02/20/2012 12:26 PM    TAKE these medications         albuterol (5 MG/ML) 0.5% nebulizer solution   Commonly known as: PROVENTIL   Take 2.5 mg by nebulization 4 (four) times daily as needed. For shortness of breath      albuterol 108 (90 BASE) MCG/ACT inhaler   Commonly known as: PROVENTIL HFA;VENTOLIN HFA   Inhale 2 puffs into the lungs every 4 (four) hours as needed for wheezing or shortness of breath. Wheezing/shortnes of breath      ALPRAZolam 0.5 MG tablet   Commonly known as: XANAX   Take 1 tablet (0.5 mg total) by mouth 2 (two) times daily as needed for anxiety (And for tremulousness assoc with meds.).      citalopram 20 MG tablet   Commonly known as: CELEXA   Take 20 mg by mouth every morning.      dextromethorphan 15 MG/5ML syrup   Take 10 mLs by mouth 4 (four) times daily as needed. Cold Symptoms      fluticasone 50 MCG/ACT nasal spray   Commonly known as: FLONASE   Place 2 sprays into the nose daily.      gabapentin 300 MG capsule   Commonly known as: NEURONTIN   Take 1 capsule (300 mg  total) by mouth 3 (three) times daily.      ibuprofen 200 MG tablet   Commonly known as: ADVIL,MOTRIN   Take 200-400 mg by mouth daily as needed. For pain      IRON SUPPLEMENT 325 (65 FE) MG tablet   Generic drug: ferrous sulfate   Take 325 mg by mouth daily with breakfast.      metFORMIN 500 MG tablet   Commonly known as: GLUCOPHAGE   Take 500 mg  by mouth 2 (two) times daily.      mometasone-formoterol 100-5 MCG/ACT Aero   Commonly known as: DULERA   Inhale 2 puffs into the lungs 2 (two) times daily.      multivitamin capsule   Take 1 capsule by mouth daily.      neomycin-polymyxin-hydrocortisone otic solution   Commonly known as: CORTISPORIN   Place 3 drops into the right ear 4 (four) times daily. For 1 week      nicotine 21 mg/24hr patch   Commonly known as: NICODERM CQ - dosed in mg/24 hours   Place 1 patch onto the skin daily.      nystatin 100000 UNIT/ML suspension   Commonly known as: MYCOSTATIN   Take 5 mLs (500,000 Units total) by mouth 4 (four) times daily.      omeprazole 20 MG capsule   Commonly known as: PRILOSEC   Take 20 mg by mouth every morning.      ondansetron 8 MG tablet   Commonly known as: ZOFRAN   Take 1 tablet (8 mg total) by mouth every 8 (eight) hours as needed for nausea.      oxyCODONE-acetaminophen 5-325 MG per tablet   Commonly known as: PERCOCET/ROXICET   Take 1 tablet by mouth 3 (three) times daily as needed for pain.      predniSONE 10 MG tablet   Commonly known as: DELTASONE   Take 40mg  po daily for 2 days then 30mg  po daily for 2 days then 20mg  po daily for 2 days then 10mg  po daily for 2 days then stop      valsartan 80 MG tablet   Commonly known as: DIOVAN   Take 1 tablet (80 mg total) by mouth daily.           Follow-up Information    Follow up with [facility] RN)    Solicitor information:   [address] [place] [address] [phone]      Follow up with Milinda Antis, MD. Schedule  an appointment as soon as possible for a visit in 2 weeks.   Contact information:   [address] [place] [address] [phone]           The results of significant diagnostics from this hospitalization (including imaging, microbiology, ancillary and laboratory) are listed below for reference.    Significant Diagnostic Studies: Dg Chest 2 View  02/13/2012  *RADIOLOGY REPORT*  Clinical Data: Chest pain.  CHEST - 2 VIEW  Comparison: October 19, 2011.  Findings: Cardiomediastinal silhouette appears normal. Peribronchial thickening is seen bilaterally which is unchanged compared to prior exam.  No acute pulmonary disease is noted.  Bony thorax is intact.  IMPRESSION: No acute cardiopulmonary abnormality seen.   Original Report Authenticated By: Venita Sheffield., M.D.    Ct Head Wo Contrast  01/23/2012  *RADIOLOGY REPORT*  Clinical Data: CVA.  CT HEAD WITHOUT CONTRAST  Technique:  Contiguous axial images were obtained from the base of the skull through the vertex without contrast.  Comparison: 11/30/2010.  Findings: The ventricles are normal.  No extra-axial fluid collections are seen.  The brainstem and cerebellum are unremarkable.  No acute intracranial findings such as infarction or hemorrhage.  No mass lesions.  The bony calvarium is intact. The paranasal sinuses are grossly clear.  Right mastoid and middle ear effusions are noted.  IMPRESSION: No acute intracranial findings or mass lesion. Right mastoid and middle ear effusion.  Original Report Authenticated By: P. Loralie Champagne, M.D.    Ct Angio Chest Pe W/cm &/or Wo Cm  02/17/2012  *RADIOLOGY REPORT*  Clinical Data: Chest tightness, short of breath, COPD  CT ANGIOGRAPHY CHEST  Technique:  Multidetector CT imaging of the chest using the standard protocol during bolus administration of intravenous contrast. Multiplanar reconstructed images including MIPs were obtained and reviewed to evaluate the vascular anatomy.  Contrast:  OMNIPAQUE IOHEXOL 350 MG/ML SOLN  Comparison: The CT 05/05/2011  Findings: There are no filling defects within the pulmonary arteries to suggest acute pulmonary embolism.  No acute findings of the aorta or great vessels.  No pericardial fluid.  There are small lymph nodes in the prevascular space which are unchanged from prior.  Small paratracheal lymph nodes are also unchanged. Esophagus is normal.  Review of the lung parenchyma demonstrates diffuse ground-glass opacities predominately within the upper lobes but also extending to the lower lobes.  These findings were present on CT of 05/05/2011 but are worse on today's scan.  No pleural fluid.   Limited view of the upper outer is unremarkable. Limited view of the skeleton is unremarkable.  IMPRESSION:  1.  No evidence of acute pulmonary embolism. 2.  Diffuse ground-glass opacities with an upper lobe predominance suggest pulmonary edema.  Cannot exclude an inflammatory or infectious process.  Findings are similar to but worse than comparison exam of 05/05/2011.   Original Report Authenticated By: Genevive Bi, M.D.    Mr Brain Wo Contrast  01/23/2012  *RADIOLOGY REPORT*  Clinical Data: right facial numbness.  Right arm numbness.  MRI HEAD WITHOUT CONTRAST  Technique:  Multiplanar, multiecho pulse sequences of the brain and surrounding structures were obtained according to standard protocol without intravenous contrast.  Comparison: Head CT same day  Findings: The brain has a normal appearance on all pulse sequences without evidence of atrophy, old or acute infarction, mass lesion, hemorrhage, hydrocephalus or extra-axial collection.  No pituitary mass.  No skull or skull base lesion. There is some fluid in the right mastoid region and middle ear.  IMPRESSION: Normal MRI of the brain.   Original Report Authenticated By: Thomasenia Sales, M.D.    Mr Cervical Spine Wo Contrast  01/23/2012  *RADIOLOGY REPORT*  Clinical Data: Right arm weakness.  Right arm  numbness.  MRI CERVICAL SPINE WITHOUT CONTRAST  Technique:  Multiplanar and multiecho pulse sequences of the cervical spine, to include the craniocervical junction and cervicothoracic junction, were obtained according to standard protocol without intravenous contrast.  Comparison: None.  Findings: The foramen magnum is widely patent.  There is no abnormality is C1-2 or C2-3.  At C3-4, C4-5, C5-6 and C6-7, there are mild disc bulges but there is no disc herniation or compressive narrowing of the canal or foramina.  C7-T1 is normal.  No abnormal cord signal.  IMPRESSION: Non compressive cervical disc bulges.  No cause of the described symptoms is identified.   Original Report Authenticated By: Thomasenia Sales, M.D.     Microbiology: Recent Results (from the past 240 hour(s))  MRSA PCR SCREENING     Status: Normal   Collection Time   02/13/12  7:44 PM      Component Value Range Status Comment   MRSA by PCR NEGATIVE  NEGATIVE Final      Labs: Basic Metabolic Panel:  Lab 02/20/12 1610 02/18/12 0601 02/15/12 0404 02/14/12 0500 02/13/12 2042 02/13/12 1440  NA -- 134* 140 134* -- 131*  K -- 4.2 3.9  4.5 -- 3.3*  CL -- 93* 101 98 -- 92*  CO2 -- 33* 26 25 -- 25  GLUCOSE -- 317* 158* 245* -- 202*  BUN -- 16 9 9  -- 8  CREATININE 0.73 0.52 0.51 0.57 -- 0.74  CALCIUM -- 9.0 9.0 9.1 -- 9.2  MG -- -- -- -- 1.9 --  PHOS -- -- -- -- -- --   Liver Function Tests:  Lab 02/13/12 2042  AST 12  ALT 13  ALKPHOS 41  BILITOT 0.6  PROT 7.2  ALBUMIN 3.5   No results found for this basename: LIPASE:5,AMYLASE:5 in the last 168 hours No results found for this basename: AMMONIA:5 in the last 168 hours CBC:  Lab 02/18/12 0601 02/15/12 0404 02/14/12 0500 02/13/12 1440  WBC 15.6* 29.6* 15.5* 12.9*  NEUTROABS -- -- -- --  HGB 9.8* 10.4* 10.3* 11.1*  HCT 33.1* 35.3* 34.6* 37.0  MCV 73.7* 74.0* 72.8* 73.0*  PLT 261 328 284 285   Cardiac Enzymes:  Lab 02/17/12 1026 02/13/12 1440  CKTOTAL -- --  CKMB --  --  CKMBINDEX -- --  TROPONINI <0.30 <0.30   BNP: BNP (last 3 results)  Basename 02/18/12 0601  PROBNP 485.0*   CBG:  Lab 02/20/12 1124 02/20/12 0719 02/19/12 2057 02/19/12 1647 02/19/12 1158  GLUCAP 161* 73 162* 149* 285*       Signed:  Kelley Knoth  [facility] 02/20/2012, 12:26 PM

## 2012-02-20 NOTE — Progress Notes (Signed)
Discharge instructions given to patient with no questions. Waiting for daughter go get off of work to pick her up.

## 2012-02-26 ENCOUNTER — Telehealth: Payer: Self-pay

## 2012-02-26 MED ORDER — FLUCONAZOLE 150 MG PO TABS: 150.0000 mg | ORAL_TABLET | Freq: Once | ORAL | Status: AC

## 2012-02-26 NOTE — Telephone Encounter (Signed)
[name] was out seeing pt and she reports a yeast infection and wants a diflucan sent to [facility].  Also - She uses O2 at night through [facility] and her nose has been drying out and she needs an order for the humidified o2 so it can be added  Also she has been using the celexa 20 and states she is still depressed. Wanted you to be aware incase she doesn't mention it at her appt

## 2012-02-26 NOTE — Telephone Encounter (Signed)
Dilfucan sent, Script for oxygen written.  Re-evaluate depression at next visit

## 2012-02-27 ENCOUNTER — Telehealth: Payer: Self-pay | Admitting: Family Medicine

## 2012-02-27 MED ORDER — MOMETASONE FURO-FORMOTEROL FUM 100-5 MCG/ACT IN AERO: 2.0000 | INHALATION_SPRAY | Freq: Two times a day (BID) | RESPIRATORY_TRACT | Status: DC

## 2012-02-27 NOTE — Telephone Encounter (Signed)
Refill sent in

## 2012-02-27 NOTE — Telephone Encounter (Signed)
Script sent in

## 2012-03-04 ENCOUNTER — Telehealth: Payer: Self-pay | Admitting: Family Medicine

## 2012-03-06 ENCOUNTER — Other Ambulatory Visit: Payer: Self-pay | Admitting: Family Medicine

## 2012-03-07 ENCOUNTER — Ambulatory Visit (INDEPENDENT_AMBULATORY_CARE_PROVIDER_SITE_OTHER): Payer: Medicaid Other | Admitting: Family Medicine

## 2012-03-07 ENCOUNTER — Encounter: Payer: Self-pay | Admitting: Family Medicine

## 2012-03-07 VITALS — BP 130/72 | HR 100 | Resp 15 | Ht 64.0 in | Wt 259.8 lb

## 2012-03-07 DIAGNOSIS — F32A Depression, unspecified: Secondary | ICD-10-CM

## 2012-03-07 DIAGNOSIS — F329 Major depressive disorder, single episode, unspecified: Secondary | ICD-10-CM

## 2012-03-07 DIAGNOSIS — F3289 Other specified depressive episodes: Secondary | ICD-10-CM

## 2012-03-07 DIAGNOSIS — I10 Essential (primary) hypertension: Secondary | ICD-10-CM

## 2012-03-07 DIAGNOSIS — R42 Dizziness and giddiness: Secondary | ICD-10-CM

## 2012-03-07 DIAGNOSIS — Z720 Tobacco use: Secondary | ICD-10-CM

## 2012-03-07 DIAGNOSIS — J9610 Chronic respiratory failure, unspecified whether with hypoxia or hypercapnia: Secondary | ICD-10-CM

## 2012-03-07 DIAGNOSIS — F17200 Nicotine dependence, unspecified, uncomplicated: Secondary | ICD-10-CM

## 2012-03-07 DIAGNOSIS — J449 Chronic obstructive pulmonary disease, unspecified: Secondary | ICD-10-CM

## 2012-03-07 MED ORDER — CITALOPRAM HYDROBROMIDE 40 MG PO TABS: 40.0000 mg | ORAL_TABLET | Freq: Every morning | ORAL | Status: DC

## 2012-03-07 MED ORDER — ALPRAZOLAM 0.5 MG PO TABS: 0.5000 mg | ORAL_TABLET | Freq: Two times a day (BID) | ORAL | Status: DC | PRN

## 2012-03-07 MED ORDER — OXYCODONE-ACETAMINOPHEN 5-325 MG PO TABS: 1.0000 | ORAL_TABLET | Freq: Three times a day (TID) | ORAL | Status: DC | PRN

## 2012-03-07 MED ORDER — ACCU-CHEK MULTICLIX LANCETS MISC: Status: DC

## 2012-03-07 MED ORDER — GLUCOSE BLOOD VI STRP: ORAL_STRIP | Status: DC

## 2012-03-07 NOTE — Assessment & Plan Note (Signed)
Oxygen needed, continue at bedtime and during day as needed

## 2012-03-07 NOTE — Assessment & Plan Note (Signed)
BP looks good, however with change having dizziness, will decrease to 1/2 tablet, pt also on narcotic other chronic problems

## 2012-03-07 NOTE — Assessment & Plan Note (Signed)
Decrease BP meds, DM well controlled Sats up to baseline

## 2012-03-07 NOTE — Progress Notes (Signed)
  Subjective:    Patient ID: [name], female    DOB: 1966/01/04, 46 y.o.   MRN: [id number]  HPI  Patient here to followup hospitalization for COPD exacerbation. She's here today with [facility] [place]/P4. She was sent home with a COPD fracture home health care program. Her breathing is much improved. She is using her nebulizer every 4 hours as needed. She's also going to restart her Elwin Sleight that this was authorized. She continues to smoke. Now on 2 L at bedtime. She's been very depressed recently and feels the Celexa is not helping. She's concerned because she is losing her health insurance and her health is bad at this time. She was put on Xanax by the hospital for her anxiety and to help her sleep and she would like this refilled She has been getting dizziness when she stands. Her home health nurses and check her blood pressure and noticed it has been dropping. She has a fall to knees after dizzy spell, no LOC, no injury. She was recently changed to Diovan from Benicar because she was unable to afford the Benicar. Review of Systems   GEN- denies fatigue, fever, weight loss,weakness, recent illness HEENT- denies eye drainage, change in vision, nasal discharge, CVS- denies chest pain, palpitations RESP- denies SOB, +cough, wheeze ABD- denies N/V, change in stools, abd pain GU- denies dysuria, hematuria, dribbling, incontinence MSK- + joint pain, muscle aches, injury Neuro- denies headache, dizziness, syncope, seizure activity      Objective:   Physical Exam GEN- NAD, alert and oriented x3, obese  HEENT- PERRL, EOMI, non injected sclera, pink conjunctiva, MMM, oropharynx clear, Right perforated TM- no acute discharge  CVS- RRR, no murmur RESP-normal WOB, minimal wheeze, mild rhonchi  EXT- trace pedal  edema Pulses- Radial, DP- 2+ Psych- depressed affect when discussing situation, crying, not overly anxious  CNII-XII in tact, mild asymmetry in smile, no new focal deficits Sitting 130/72    Standing 128/68      Assessment & Plan:

## 2012-03-07 NOTE — Assessment & Plan Note (Signed)
Increase celexa to 40mg , low dose benzo, referral to behavioral health through [facility],

## 2012-03-07 NOTE — Telephone Encounter (Signed)
Patient in for visit today.

## 2012-03-07 NOTE — Patient Instructions (Addendum)
Take 1/2 of blood pressure pill  I will f/u with [facility] for your blood pressure readings  Increase Celexa to 40mg   Xanax short term for anxiety/sleep Referral to [facility]- P[facility] Accucheck meter ( New prescription for twice a day testing)  F/U 3 months ( Call us about the Medicaid )

## 2012-03-07 NOTE — Assessment & Plan Note (Signed)
Unchanged, she will try to get patches again

## 2012-03-07 NOTE — Assessment & Plan Note (Signed)
She is rebounding from exacerbation, Back on inhaled steroid

## 2012-03-12 DIAGNOSIS — I10 Essential (primary) hypertension: Secondary | ICD-10-CM

## 2012-03-12 DIAGNOSIS — J440 Chronic obstructive pulmonary disease with acute lower respiratory infection: Secondary | ICD-10-CM

## 2012-03-12 DIAGNOSIS — D649 Anemia, unspecified: Secondary | ICD-10-CM

## 2012-03-12 DIAGNOSIS — E119 Type 2 diabetes mellitus without complications: Secondary | ICD-10-CM

## 2012-03-14 ENCOUNTER — Ambulatory Visit: Payer: Medicaid Other | Admitting: Family Medicine

## 2012-03-18 ENCOUNTER — Encounter: Payer: Self-pay | Admitting: Internal Medicine

## 2012-03-18 ENCOUNTER — Ambulatory Visit (INDEPENDENT_AMBULATORY_CARE_PROVIDER_SITE_OTHER): Payer: Medicaid Other | Admitting: Internal Medicine

## 2012-03-18 VITALS — BP 110/58 | HR 95 | Temp 97.5°F | Ht 64.0 in | Wt 265.0 lb

## 2012-03-18 DIAGNOSIS — Z720 Tobacco use: Secondary | ICD-10-CM

## 2012-03-18 DIAGNOSIS — J4489 Other specified chronic obstructive pulmonary disease: Secondary | ICD-10-CM

## 2012-03-18 DIAGNOSIS — R918 Other nonspecific abnormal finding of lung field: Secondary | ICD-10-CM

## 2012-03-18 DIAGNOSIS — J449 Chronic obstructive pulmonary disease, unspecified: Secondary | ICD-10-CM

## 2012-03-18 DIAGNOSIS — F17200 Nicotine dependence, unspecified, uncomplicated: Secondary | ICD-10-CM

## 2012-03-18 NOTE — Patient Instructions (Addendum)
The key is to stop smoking completely before smoking completely stops you!   Change dulera to 100 2 every 12 hours when breathing is bad, not daily  You do not require follow up here unless losing ground with your activity tolerance off cigarettes.

## 2012-03-18 NOTE — Assessment & Plan Note (Signed)
>  3 min I reviewed the Flethcher curve with patient that basically indicates  if you quit smoking when your best day FEV1 is still well preserved (as hers clearly is)  it is highly unlikely you will progress to severe disease and informed the patient there was no medication on the market that has proven to change the curve or the likelihood of progression.  Therefore stopping smoking and maintaining abstinence is the most important aspect of care, not choice of inhalers or for that matter, doctors.   

## 2012-03-18 NOTE — Progress Notes (Signed)
Subjective:    Patient ID: [name], female    DOB: Dec 13, 1965  MRN: [id number]  HPI  46 yowf active smoker with pna as 46 year old but then could keep up with classmates fine at PE but developed recurrent exacerbations of cough starting in 2010 and referred to pulmonary clinic 12/22/2011 by Dr Jeanice Lim p 3 admissions to [facility] since Nov 2012 with dx of pna and refractory cough and sob since then.  Spirometry normal 03/18/2012    12/22/2011 1st pulmonary eval on ACEI  cc doe x walking outside in heat but does ok indoors, very sedentary, also  bad cough when not taking cough medications coughs so hard she vomits, more productive in am thick yellow, never bloody. Not using nebs x sev weeks.  rec Prilosec Take 30-60 min before first meal of the day and Zantac 150 mg one at bedtime Stop lisinopril and start benicar 20 mg one daily (ok to take one half if too strong) - you should be able to taper off your cough medication soon  Stop advair and spiriva Dulera 100 Take 2 puffs first thing in am and then another 2 puffs about 12 hours later.  Only use your albuterol as a rescue medication  .  Please schedule a follow up office visit in 4 weeks, sooner if needed with pfts and cxr> did not return   03/18/2012 f/u ov/Hiilei Gerst still smoking  cc breathing better on dulera 100 2 bid and much less cough and need for albuterol , only needs hfa, not neb at all.  No obvious daytime variabilty or assoc excess mucus or cp or chest tightness, subjective wheeze overt sinus or hb symptoms. No unusual exp hx    Sleeping ok without nocturnal  or early am exacerbation  of respiratory  c/o's or need for noct saba. Also denies any obvious fluctuation of symptoms with weather or environmental changes or other aggravating or alleviating factors except as outlined above   ROS  The following are not active complaints unless bolded sore throat, dysphagia, dental problems, itching, sneezing,  nasal congestion or excess/  purulent secretions, ear ache,   fever, chills, sweats, unintended wt loss, pleuritic or exertional cp, hemoptysis,  orthopnea pnd or leg swelling, presyncope, palpitations, heartburn, abdominal pain, anorexia, nausea, vomiting, diarrhea  or change in bowel or urinary habits, change in stools or urine, dysuria,hematuria,  rash, arthralgias, visual complaints, headache, numbness weakness or ataxia or problems with walking or coordination,  change in mood/affect or memory.              Objective:   Physical Exam amb obese / slt cushingnoid wf nad with miniimal  pseudowheeze  03/18/2012 wt  265 Wt Readings from Last 3 Encounters:  12/22/11 259 lb 9.6 oz (117.754 kg)  11/16/11 257 lb (116.574 kg)  11/09/11 252 lb 0.8 oz (114.329 kg)     HEENT: nl dentition, turbinates, and orophanx. Nl external ear canals without cough reflex   NECK :  without JVD/Nodes/TM/ nl carotid upstrokes bilaterally   LUNGS: no acc muscle use, clear to A and P bilaterally without cough on insp or exp maneuvers   CV:  RRR  no s3 or murmur or increase in P2, no edema   ABD:  soft and nontender with nl excursion in the supine position. No bruits or organomegaly, bowel sounds nl  MS:  warm without deformities, calf tenderness, cyanosis or clubbing  SKIN: warm and dry without lesions    NEURO:  alert, approp, no deficits     Ct chest 02/17/12 1. No evidence of acute pulmonary embolism.  2. Diffuse ground-glass opacities with an upper lobe predominance  suggest pulmonary edema. Cannot exclude an inflammatory or  infectious process. Findings are similar to but worse than  comparison exam of 05/05/2011.        Assessment & Plan:

## 2012-03-18 NOTE — Assessment & Plan Note (Signed)
DDx for pulmonary fibrosis  includes idiopathic pulmonary fibrosis, pulmonary fibrosis associated with rheumatologic diseases (which have a relatively benign course in most cases) , adverse effect from  drugs such as chemotherapy or amiodarone exposure, nonspecific interstitial pneumonia which is typically steroid responsive, and chronic hypersensitivity pneumonitis.   In active  smokers Langerhan's Cell  Histiocyctosis (eosinophilic granuomatosis),  DIP,  and Respiratory Bronchiolitis ILD also need to be considered,  And these are the most likely causes here so no reason to pursue w/u unless symptoms and or cxr's evolve further changes off cigarettes

## 2012-03-18 NOTE — Assessment & Plan Note (Addendum)
PFTs nl today despite persistent doe and over use of albuterol  I had an extended discussion with the patient today lasting 15 to 20 minutes of a 25 minute visit on the following issues:   Can't afford to buy dulera so clearly can't afford to smoke > discussed separately  If stops smoking can prob just just dulera as needed and only use albuterol as a second line "rescue"

## 2012-04-10 ENCOUNTER — Other Ambulatory Visit: Payer: Self-pay

## 2012-04-10 ENCOUNTER — Telehealth: Payer: Self-pay | Admitting: Family Medicine

## 2012-04-10 MED ORDER — OXYCODONE-ACETAMINOPHEN 5-325 MG PO TABS: 1.0000 | ORAL_TABLET | Freq: Three times a day (TID) | ORAL | Status: DC | PRN

## 2012-04-10 NOTE — Telephone Encounter (Signed)
Patient aware that rx will have to be collected on 12/19

## 2012-05-07 ENCOUNTER — Encounter (HOSPITAL_COMMUNITY): Payer: Self-pay | Admitting: *Deleted

## 2012-05-07 ENCOUNTER — Emergency Department (HOSPITAL_COMMUNITY): Payer: Medicaid Other

## 2012-05-07 ENCOUNTER — Inpatient Hospital Stay (HOSPITAL_COMMUNITY)
Admission: EM | Admit: 2012-05-07 | Discharge: 2012-05-10 | DRG: 194 | Disposition: A | Discharge status: Home / Self Care | Payer: Medicaid Other | Attending: Internal Medicine | Admitting: Internal Medicine

## 2012-05-07 DIAGNOSIS — R918 Other nonspecific abnormal finding of lung field: Secondary | ICD-10-CM

## 2012-05-07 DIAGNOSIS — E1159 Type 2 diabetes mellitus with other circulatory complications: Secondary | ICD-10-CM | POA: Diagnosis present

## 2012-05-07 DIAGNOSIS — B370 Candidal stomatitis: Secondary | ICD-10-CM

## 2012-05-07 DIAGNOSIS — M2550 Pain in unspecified joint: Secondary | ICD-10-CM

## 2012-05-07 DIAGNOSIS — J189 Pneumonia, unspecified organism: Principal | ICD-10-CM

## 2012-05-07 DIAGNOSIS — G629 Polyneuropathy, unspecified: Secondary | ICD-10-CM

## 2012-05-07 DIAGNOSIS — F329 Major depressive disorder, single episode, unspecified: Secondary | ICD-10-CM

## 2012-05-07 DIAGNOSIS — D229 Melanocytic nevi, unspecified: Secondary | ICD-10-CM

## 2012-05-07 DIAGNOSIS — J9610 Chronic respiratory failure, unspecified whether with hypoxia or hypercapnia: Secondary | ICD-10-CM

## 2012-05-07 DIAGNOSIS — K219 Gastro-esophageal reflux disease without esophagitis: Secondary | ICD-10-CM

## 2012-05-07 DIAGNOSIS — G510 Bell's palsy: Secondary | ICD-10-CM

## 2012-05-07 DIAGNOSIS — G5600 Carpal tunnel syndrome, unspecified upper limb: Secondary | ICD-10-CM

## 2012-05-07 DIAGNOSIS — Z6841 Body Mass Index (BMI) 40.0 and over, adult: Secondary | ICD-10-CM

## 2012-05-07 DIAGNOSIS — G5603 Carpal tunnel syndrome, bilateral upper limbs: Secondary | ICD-10-CM

## 2012-05-07 DIAGNOSIS — E119 Type 2 diabetes mellitus without complications: Secondary | ICD-10-CM

## 2012-05-07 DIAGNOSIS — D649 Anemia, unspecified: Secondary | ICD-10-CM | POA: Diagnosis present

## 2012-05-07 DIAGNOSIS — R0902 Hypoxemia: Secondary | ICD-10-CM

## 2012-05-07 DIAGNOSIS — Z9981 Dependence on supplemental oxygen: Secondary | ICD-10-CM

## 2012-05-07 DIAGNOSIS — J45909 Unspecified asthma, uncomplicated: Secondary | ICD-10-CM | POA: Diagnosis present

## 2012-05-07 DIAGNOSIS — E876 Hypokalemia: Secondary | ICD-10-CM

## 2012-05-07 DIAGNOSIS — R0789 Other chest pain: Secondary | ICD-10-CM

## 2012-05-07 DIAGNOSIS — F411 Generalized anxiety disorder: Secondary | ICD-10-CM | POA: Diagnosis present

## 2012-05-07 DIAGNOSIS — Z8249 Family history of ischemic heart disease and other diseases of the circulatory system: Secondary | ICD-10-CM

## 2012-05-07 DIAGNOSIS — J441 Chronic obstructive pulmonary disease with (acute) exacerbation: Secondary | ICD-10-CM

## 2012-05-07 DIAGNOSIS — R000 Tachycardia, unspecified: Secondary | ICD-10-CM

## 2012-05-07 DIAGNOSIS — Z720 Tobacco use: Secondary | ICD-10-CM

## 2012-05-07 DIAGNOSIS — D509 Iron deficiency anemia, unspecified: Secondary | ICD-10-CM

## 2012-05-07 DIAGNOSIS — R42 Dizziness and giddiness: Secondary | ICD-10-CM

## 2012-05-07 DIAGNOSIS — J101 Influenza due to other identified influenza virus with other respiratory manifestations: Secondary | ICD-10-CM

## 2012-05-07 DIAGNOSIS — M549 Dorsalgia, unspecified: Secondary | ICD-10-CM

## 2012-05-07 DIAGNOSIS — I10 Essential (primary) hypertension: Secondary | ICD-10-CM

## 2012-05-07 DIAGNOSIS — Z79899 Other long term (current) drug therapy: Secondary | ICD-10-CM

## 2012-05-07 DIAGNOSIS — J45901 Unspecified asthma with (acute) exacerbation: Secondary | ICD-10-CM | POA: Diagnosis present

## 2012-05-07 DIAGNOSIS — F17210 Nicotine dependence, cigarettes, uncomplicated: Secondary | ICD-10-CM | POA: Diagnosis present

## 2012-05-07 DIAGNOSIS — F32A Depression, unspecified: Secondary | ICD-10-CM

## 2012-05-07 DIAGNOSIS — R1310 Dysphagia, unspecified: Secondary | ICD-10-CM

## 2012-05-07 DIAGNOSIS — E1142 Type 2 diabetes mellitus with diabetic polyneuropathy: Secondary | ICD-10-CM | POA: Diagnosis present

## 2012-05-07 DIAGNOSIS — F3289 Other specified depressive episodes: Secondary | ICD-10-CM | POA: Diagnosis present

## 2012-05-07 DIAGNOSIS — F17200 Nicotine dependence, unspecified, uncomplicated: Secondary | ICD-10-CM | POA: Diagnosis present

## 2012-05-07 DIAGNOSIS — R1319 Other dysphagia: Secondary | ICD-10-CM

## 2012-05-07 DIAGNOSIS — K259 Gastric ulcer, unspecified as acute or chronic, without hemorrhage or perforation: Secondary | ICD-10-CM

## 2012-05-07 DIAGNOSIS — J9620 Acute and chronic respiratory failure, unspecified whether with hypoxia or hypercapnia: Secondary | ICD-10-CM

## 2012-05-07 LAB — GLUCOSE, CAPILLARY: Glucose-Capillary: 106 mg/dL — ABNORMAL HIGH (ref 70–99)

## 2012-05-07 LAB — URINALYSIS, ROUTINE W REFLEX MICROSCOPIC
Bilirubin Urine: NEGATIVE
Glucose, UA: NEGATIVE mg/dL
Ketones, ur: NEGATIVE mg/dL
Leukocytes, UA: NEGATIVE
Nitrite: NEGATIVE
Protein, ur: NEGATIVE mg/dL
Specific Gravity, Urine: 1.02 (ref 1.005–1.030)
Urobilinogen, UA: 0.2 mg/dL (ref 0.0–1.0)
pH: 5.5 (ref 5.0–8.0)

## 2012-05-07 LAB — CBC WITH DIFFERENTIAL/PLATELET
Basophils Absolute: 0 10*3/uL (ref 0.0–0.1)
Basophils Relative: 0 % (ref 0–1)
Eosinophils Absolute: 0 10*3/uL (ref 0.0–0.7)
Eosinophils Relative: 0 % (ref 0–5)
HCT: 36.4 % (ref 36.0–46.0)
Hemoglobin: 11 g/dL — ABNORMAL LOW (ref 12.0–15.0)
Lymphocytes Relative: 12 % (ref 12–46)
Lymphs Abs: 0.8 10*3/uL (ref 0.7–4.0)
MCH: 22.4 pg — ABNORMAL LOW (ref 26.0–34.0)
MCHC: 30.2 g/dL (ref 30.0–36.0)
MCV: 74 fL — ABNORMAL LOW (ref 78.0–100.0)
Monocytes Absolute: 0.7 10*3/uL (ref 0.1–1.0)
Monocytes Relative: 10 % (ref 3–12)
Neutro Abs: 5.5 10*3/uL (ref 1.7–7.7)
Neutrophils Relative %: 78 % — ABNORMAL HIGH (ref 43–77)
Platelets: 209 10*3/uL (ref 150–400)
RBC: 4.92 MIL/uL (ref 3.87–5.11)
RDW: 17.6 % — ABNORMAL HIGH (ref 11.5–15.5)
WBC: 7 10*3/uL (ref 4.0–10.5)

## 2012-05-07 LAB — COMPREHENSIVE METABOLIC PANEL
ALT: 10 U/L (ref 0–35)
AST: 14 U/L (ref 0–37)
Albumin: 3.6 g/dL (ref 3.5–5.2)
Alkaline Phosphatase: 47 U/L (ref 39–117)
BUN: 6 mg/dL (ref 6–23)
CO2: 26 mEq/L (ref 19–32)
Calcium: 8.8 mg/dL (ref 8.4–10.5)
Chloride: 96 mEq/L (ref 96–112)
Creatinine, Ser: 0.62 mg/dL (ref 0.50–1.10)
GFR calc Af Amer: >90 mL/min (ref >90)
GFR calc non Af Amer: >90 mL/min (ref >90)
Glucose, Bld: 120 mg/dL — ABNORMAL HIGH (ref 70–99)
Potassium: 3.3 mEq/L — ABNORMAL LOW (ref 3.5–5.1)
Sodium: 132 mEq/L — ABNORMAL LOW (ref 135–145)
Total Bilirubin: 0.3 mg/dL (ref 0.3–1.2)
Total Protein: 7.2 g/dL (ref 6.0–8.3)

## 2012-05-07 LAB — INFLUENZA PANEL BY PCR (TYPE A & B)
H1N1 flu by pcr: DETECTED — AB
Influenza A By PCR: POSITIVE — AB
Influenza B By PCR: NEGATIVE

## 2012-05-07 LAB — URINE MICROSCOPIC-ADD ON

## 2012-05-07 LAB — LACTIC ACID, PLASMA: Lactic Acid, Venous: 1.3 mmol/L (ref 0.5–2.2)

## 2012-05-07 MED ORDER — METFORMIN HCL 500 MG PO TABS
500.0000 mg | ORAL_TABLET | Freq: Two times a day (BID) | ORAL | Status: DC
  Administered 2012-05-08 – 2012-05-10 (×5): 500 mg via ORAL
  Filled 2012-05-07 (×5): qty 1

## 2012-05-07 MED ORDER — MOMETASONE FURO-FORMOTEROL FUM 100-5 MCG/ACT IN AERO
2.0000 | INHALATION_SPRAY | Freq: Two times a day (BID) | RESPIRATORY_TRACT | Status: DC
  Administered 2012-05-08 – 2012-05-10 (×5): 2 via RESPIRATORY_TRACT
  Filled 2012-05-07: qty 8.8

## 2012-05-07 MED ORDER — ONDANSETRON HCL 4 MG/2ML IJ SOLN
4.0000 mg | INTRAMUSCULAR | Status: DC | PRN
  Administered 2012-05-08: 4 mg via INTRAVENOUS
  Filled 2012-05-07: qty 2

## 2012-05-07 MED ORDER — ALBUTEROL SULFATE (5 MG/ML) 0.5% IN NEBU
2.5000 mg | INHALATION_SOLUTION | RESPIRATORY_TRACT | Status: DC
  Administered 2012-05-07 – 2012-05-10 (×12): 2.5 mg via RESPIRATORY_TRACT
  Filled 2012-05-07 (×14): qty 0.5

## 2012-05-07 MED ORDER — IPRATROPIUM BROMIDE 0.02 % IN SOLN
0.5000 mg | RESPIRATORY_TRACT | Status: DC
  Administered 2012-05-07 – 2012-05-10 (×12): 0.5 mg via RESPIRATORY_TRACT
  Filled 2012-05-07 (×14): qty 2.5

## 2012-05-07 MED ORDER — VANCOMYCIN HCL IN DEXTROSE 1-5 GM/200ML-% IV SOLN
1000.0000 mg | Freq: Once | INTRAVENOUS | Status: AC
  Administered 2012-05-08: 1000 mg via INTRAVENOUS
  Filled 2012-05-07: qty 200

## 2012-05-07 MED ORDER — DEXTROSE 5 % IV SOLN
INTRAVENOUS | Status: AC
  Filled 2012-05-07 (×2): qty 1

## 2012-05-07 MED ORDER — FERROUS SULFATE 325 (65 FE) MG PO TABS
325.0000 mg | ORAL_TABLET | Freq: Every day | ORAL | Status: DC
  Administered 2012-05-08 – 2012-05-10 (×3): 325 mg via ORAL
  Filled 2012-05-07 (×3): qty 1

## 2012-05-07 MED ORDER — INSULIN ASPART 100 UNIT/ML ~~LOC~~ SOLN
0.0000 [IU] | Freq: Three times a day (TID) | SUBCUTANEOUS | Status: DC
  Administered 2012-05-08: 1 [IU] via SUBCUTANEOUS
  Administered 2012-05-09: 2 [IU] via SUBCUTANEOUS
  Administered 2012-05-09 – 2012-05-10 (×2): 1 [IU] via SUBCUTANEOUS
  Administered 2012-05-10: 2 [IU] via SUBCUTANEOUS

## 2012-05-07 MED ORDER — ALBUTEROL SULFATE (5 MG/ML) 0.5% IN NEBU
5.0000 mg | INHALATION_SOLUTION | Freq: Once | RESPIRATORY_TRACT | Status: AC
  Administered 2012-05-07: 5 mg via RESPIRATORY_TRACT
  Filled 2012-05-07: qty 1

## 2012-05-07 MED ORDER — MOMETASONE FURO-FORMOTEROL FUM 100-5 MCG/ACT IN AERO
INHALATION_SPRAY | RESPIRATORY_TRACT | Status: AC
  Filled 2012-05-07: qty 8.8

## 2012-05-07 MED ORDER — IBUPROFEN 800 MG PO TABS
400.0000 mg | ORAL_TABLET | Freq: Four times a day (QID) | ORAL | Status: DC
  Administered 2012-05-07 – 2012-05-08 (×3): 400 mg via ORAL
  Filled 2012-05-07 (×3): qty 1

## 2012-05-07 MED ORDER — ENOXAPARIN SODIUM 40 MG/0.4ML ~~LOC~~ SOLN
40.0000 mg | Freq: Every day | SUBCUTANEOUS | Status: DC
  Administered 2012-05-07 – 2012-05-09 (×3): 40 mg via SUBCUTANEOUS
  Filled 2012-05-07 (×3): qty 0.4

## 2012-05-07 MED ORDER — VANCOMYCIN HCL IN DEXTROSE 1-5 GM/200ML-% IV SOLN
1000.0000 mg | Freq: Once | INTRAVENOUS | Status: AC
  Administered 2012-05-07: 1000 mg via INTRAVENOUS

## 2012-05-07 MED ORDER — FLUTICASONE PROPIONATE 50 MCG/ACT NA SUSP
NASAL | Status: AC
  Filled 2012-05-07: qty 16

## 2012-05-07 MED ORDER — SODIUM CHLORIDE 0.9 % IV BOLUS (SEPSIS)
1000.0000 mL | Freq: Once | INTRAVENOUS | Status: AC
  Administered 2012-05-07: 1000 mL via INTRAVENOUS

## 2012-05-07 MED ORDER — GABAPENTIN 300 MG PO CAPS
300.0000 mg | ORAL_CAPSULE | Freq: Three times a day (TID) | ORAL | Status: DC
  Administered 2012-05-07 – 2012-05-10 (×8): 300 mg via ORAL
  Filled 2012-05-07 (×8): qty 1

## 2012-05-07 MED ORDER — IRBESARTAN 75 MG PO TABS
75.0000 mg | ORAL_TABLET | Freq: Every day | ORAL | Status: DC
  Administered 2012-05-08 – 2012-05-10 (×3): 75 mg via ORAL
  Filled 2012-05-07 (×3): qty 1

## 2012-05-07 MED ORDER — IPRATROPIUM BROMIDE 0.02 % IN SOLN
0.5000 mg | Freq: Once | RESPIRATORY_TRACT | Status: AC
Start: 2012-05-07 — End: 2012-05-07
  Administered 2012-05-07: 0.5 mg via RESPIRATORY_TRACT
  Filled 2012-05-07: qty 2.5

## 2012-05-07 MED ORDER — FLUTICASONE PROPIONATE 50 MCG/ACT NA SUSP
2.0000 | Freq: Every day | NASAL | Status: DC
  Administered 2012-05-08 – 2012-05-10 (×3): 2 via NASAL
  Filled 2012-05-07: qty 16

## 2012-05-07 MED ORDER — ACETAMINOPHEN 325 MG PO TABS: 650.0000 mg | ORAL_TABLET | ORAL | Status: DC | PRN

## 2012-05-07 MED ORDER — LEVOFLOXACIN IN D5W 750 MG/150ML IV SOLN
INTRAVENOUS | Status: AC
  Filled 2012-05-07: qty 150

## 2012-05-07 MED ORDER — NICOTINE 21 MG/24HR TD PT24
21.0000 mg | MEDICATED_PATCH | Freq: Every day | TRANSDERMAL | Status: DC
  Administered 2012-05-08 – 2012-05-10 (×3): 21 mg via TRANSDERMAL
  Filled 2012-05-07 (×3): qty 1

## 2012-05-07 MED ORDER — HYDROMORPHONE HCL PF 1 MG/ML IJ SOLN
0.5000 mg | INTRAMUSCULAR | Status: DC | PRN
  Administered 2012-05-07 – 2012-05-08 (×6): 1 mg via INTRAVENOUS
  Filled 2012-05-07 (×6): qty 1

## 2012-05-07 MED ORDER — OXYCODONE-ACETAMINOPHEN 5-325 MG PO TABS
1.0000 | ORAL_TABLET | Freq: Once | ORAL | Status: AC
  Administered 2012-05-07: 1 via ORAL
  Filled 2012-05-07: qty 1

## 2012-05-07 MED ORDER — LEVOFLOXACIN IN D5W 750 MG/150ML IV SOLN
750.0000 mg | INTRAVENOUS | Status: AC
  Administered 2012-05-07 – 2012-05-10 (×3): 750 mg via INTRAVENOUS
  Filled 2012-05-07 (×3): qty 150

## 2012-05-07 MED ORDER — VANCOMYCIN HCL IN DEXTROSE 1-5 GM/200ML-% IV SOLN
INTRAVENOUS | Status: AC
  Filled 2012-05-07: qty 200

## 2012-05-07 MED ORDER — PANTOPRAZOLE SODIUM 40 MG PO TBEC
40.0000 mg | DELAYED_RELEASE_TABLET | Freq: Every day | ORAL | Status: DC
  Administered 2012-05-08 – 2012-05-10 (×3): 40 mg via ORAL
  Filled 2012-05-07 (×3): qty 1

## 2012-05-07 MED ORDER — CITALOPRAM HYDROBROMIDE 20 MG PO TABS
40.0000 mg | ORAL_TABLET | Freq: Every morning | ORAL | Status: DC
  Administered 2012-05-08 – 2012-05-10 (×3): 40 mg via ORAL
  Filled 2012-05-07 (×3): qty 2

## 2012-05-07 MED ORDER — BENZONATATE 100 MG PO CAPS
200.0000 mg | ORAL_CAPSULE | Freq: Three times a day (TID) | ORAL | Status: DC | PRN
  Administered 2012-05-07 – 2012-05-10 (×4): 200 mg via ORAL
  Filled 2012-05-07 (×5): qty 2

## 2012-05-07 MED ORDER — DEXTROSE 5 % IV SOLN
1.0000 g | Freq: Three times a day (TID) | INTRAVENOUS | Status: DC
  Administered 2012-05-07 – 2012-05-08 (×3): 1 g via INTRAVENOUS
  Filled 2012-05-07 (×6): qty 1

## 2012-05-07 MED ORDER — ALPRAZOLAM 0.5 MG PO TABS
0.5000 mg | ORAL_TABLET | Freq: Two times a day (BID) | ORAL | Status: DC | PRN
  Administered 2012-05-07 – 2012-05-09 (×4): 0.5 mg via ORAL
  Filled 2012-05-07 (×4): qty 1

## 2012-05-07 MED ORDER — GUAIFENESIN ER 600 MG PO TB12
1200.0000 mg | ORAL_TABLET | Freq: Two times a day (BID) | ORAL | Status: DC
  Administered 2012-05-07 – 2012-05-10 (×6): 1200 mg via ORAL
  Filled 2012-05-07 (×6): qty 2

## 2012-05-07 MED ORDER — PIPERACILLIN-TAZOBACTAM 3.375 G IVPB
3.3750 g | Freq: Once | INTRAVENOUS | Status: AC
  Administered 2012-05-07: 3.375 g via INTRAVENOUS
  Filled 2012-05-07: qty 50

## 2012-05-07 MED ORDER — INSULIN ASPART 100 UNIT/ML ~~LOC~~ SOLN
0.0000 [IU] | Freq: Every day | SUBCUTANEOUS | Status: DC
  Administered 2012-05-09: 2 [IU] via SUBCUTANEOUS

## 2012-05-07 NOTE — Consult Note (Signed)
ANTIBIOTIC CONSULT NOTE-Preliminary  Pharmacy Consult for Vancomycin Indication: HCAP   Allergies  Allergen Reactions  . Codeine Itching and Nausea Only  . Wellbutrin (Bupropion Hcl) Hives    Patient Measurements: Height: 5\' 4"  (162.6 cm) Weight: 253 lb 15.5 oz (115.2 kg) IBW/kg (Calculated) : 54.7    Vital Signs: Temp: 98.6 F (37 C) (01/14 2053) Temp src: Oral (01/14 2053) BP: 134/58 mmHg (01/14 2053) Pulse Rate: 109  (01/14 1800)  Labs:  Basename 05/07/12 1605  WBC 7.0  HGB 11.0*  PLT 209  LABCREA --  CREATININE 0.62    Estimated Creatinine Clearance: 109.4 ml/min (by C-G formula based on Cr of 0.62).  No results found for this basename: VANCOTROUGH:2,VANCOPEAK:2,VANCORANDOM:2,GENTTROUGH:2,GENTPEAK:2,GENTRANDOM:2,TOBRATROUGH:2,TOBRAPEAK:2,TOBRARND:2,AMIKACINPEAK:2,AMIKACINTROU:2,AMIKACIN:2, in the last 72 hours   Microbiology: No results found for this or any previous visit (from the past 720 hour(s)).  Medical History: Past Medical History  Diagnosis Date  . Diabetes mellitus   . COPD (chronic obstructive pulmonary disease)   . HTN (hypertension)   . Low back pain   . Tachycardia     never had test done since no insurance  . Depression   . Asthma   . Shortness of breath   . Anxiety   . Gastric erosions     EGD 08/2010.  . Internal hemorrhoids     Colonoscopy 5/12.  [name] Heavy menses   . Chronic respiratory failure with hypoxia     On 2-3 L of oxygen at home  . GERD (gastroesophageal reflux disease)   . Anemia     Medications:  Levofloxacin 750 mg IV every 24 hours Cefepime 1 gm IV every 8 hours Vancomycin 1 gm IV x 1 dose in the ED   Assessment: 47 yo female with chronic respiratory failure, on home oxygen; now with COPD exacerbation, Influenza A (H1N1) positive, and chest xray suspicious for pneumonia.    Goal of Therapy:  Vancomycin troughs 15-20 mcg/ml   Plan:  Preliminary review of pertinent patient information completed.  Protocol  will be initiated with a one-time dose of Vancomycin 1 gm IV, 12 hours after the initial 1 gm IV dose given in the ED. [name] clinical pharmacist will complete review during morning rounds to assess patient and finalize treatment regimen.  [name], [facility] 05/07/2012,11:08 PM

## 2012-05-07 NOTE — ED Provider Notes (Signed)
History  This chart was scribed for Benny Lennert, MD by Ardeen Jourdain, ED Scribe. This patient was seen in room [facility]19/[facility] and the patient's care was started at 1549.  CSN: [id number]  Arrival date & time 05/07/12  1538   First MD Initiated Contact with Patient 05/07/12 1549      Chief Complaint  Patient presents with  . Shortness of Breath     Patient is a 47 y.o. female presenting with shortness of breath. The history is provided by the patient. No language interpreter was used.  Shortness of Breath  The current episode started 2 days ago. The problem occurs frequently. The problem has been gradually worsening. The problem is moderate. Nothing relieves the symptoms. Nothing aggravates the symptoms. Associated symptoms include a fever, cough and shortness of breath. Pertinent negatives include no chest pain.    [name] is a 47 y.o. female who presents to the Emergency Department complaining of SOB with associated fever, generalized body aches, productive cough and lightheadedness. She states she thinks she has the flu. She admits to sick contact. She describes her sputum as yellow in color.    Past Medical History  Diagnosis Date  . Diabetes mellitus   . COPD (chronic obstructive pulmonary disease)   . HTN (hypertension)   . Low back pain   . Tachycardia     never had test done since no insurance  . Depression   . Asthma   . Shortness of breath   . Anxiety   . Gastric erosions     EGD 08/2010.  . Internal hemorrhoids     Colonoscopy 5/12.  [name] Heavy menses   . Chronic respiratory failure with hypoxia     On 2-3 L of oxygen at home  . GERD (gastroesophageal reflux disease)   . Anemia     Past Surgical History  Procedure Date  . Cholecystectomy 1990  . Cesarean section     twice  . Kidney surgery     as child for blockages  . Tubal ligation   . Tonsillectomy   . Wrist surgery 1995    Lt wrist  . Tympanostomy tube placement   . Hysteroscopy with  thermachoice 01/17/2012    Procedure: HYSTEROSCOPY WITH THERMACHOICE;  Surgeon: Lazaro Arms, MD;  Location: [facility];  Service: Gynecology;  Laterality: N/A;  total therapy time: 9:13sec  D5W  18 ml in, D5W   18ml out, temperture 87degrees celcious  . Uterine ablation     Family History  Problem Relation Age of Onset  . Heart attack Father 32    deceased, etoh use  . Heart disease Father   . Heart attack Mother 60    deceased  . Diabetes Mother   . Breast cancer Mother   . Heart failure Mother     oxygen dependence, nonsmoker  . Heart disease Mother   . Depression Mother   . Cancer Mother   . Colon cancer Neg Hx   . Liver disease Maternal Aunt 40    died while on liver transplant list  . Heart attack Maternal Grandmother     premature CAD  . Ulcers Sister   . Hypertension Sister     History  Substance Use Topics  . Smoking status: Current Every Day Smoker -- 0.5 packs/day for 30 years    Types: Cigarettes  . Smokeless tobacco: Never Used  . Alcohol Use: Yes     Comment: social use   No OB  history available.   Review of Systems  Constitutional: Positive for fever. Negative for fatigue.  HENT: Negative for congestion, sinus pressure and ear discharge.   Eyes: Negative for discharge.  Respiratory: Positive for cough and shortness of breath.   Cardiovascular: Negative for chest pain.  Gastrointestinal: Negative for nausea, vomiting, abdominal pain and diarrhea.  Genitourinary: Negative for frequency and hematuria.  Musculoskeletal: Negative for back pain.  Skin: Negative for rash.  Neurological: Negative for seizures and headaches.  Hematological: Negative.   Psychiatric/Behavioral: Negative for hallucinations.  All other systems reviewed and are negative.    Allergies  Codeine and Wellbutrin  Home Medications   Current Outpatient Rx  Name  Route  Sig  Dispense  Refill  . ALBUTEROL SULFATE HFA 108 (90 BASE) MCG/ACT IN AERS   Inhalation   Inhale 2 puffs into  the lungs every 4 (four) hours as needed for wheezing or shortness of breath. Wheezing/shortnes of breath   1 Inhaler   3   . ALBUTEROL SULFATE (5 MG/ML) 0.5% IN NEBU   Nebulization   Take 2.5 mg by nebulization 4 (four) times daily as needed. For shortness of breath         . ALPRAZOLAM 0.5 MG PO TABS   Oral   Take 1 tablet (0.5 mg total) by mouth 2 (two) times daily as needed for anxiety (And for tremulousness assoc with meds.).   45 tablet   2   . AMOXICILLIN 500 MG PO CAPS   Oral   Take 500 mg by mouth 3 (three) times daily.         [name] CITALOPRAM HYDROBROMIDE 40 MG PO TABS   Oral   Take 1 tablet (40 mg total) by mouth every morning.   30 tablet   6   . DEXTROMETHORPHAN HBR 15 MG/5ML PO SYRP   Oral   Take 10 mLs by mouth 4 (four) times daily as needed. Cold Symptoms         . FERROUS SULFATE 325 (65 FE) MG PO TABS   Oral   Take 325 mg by mouth daily with breakfast.         . FLUTICASONE PROPIONATE 50 MCG/ACT NA SUSP   Nasal   Place 2 sprays into the nose daily.   16 g   3   . GABAPENTIN 300 MG PO CAPS   Oral   Take 1 capsule (300 mg total) by mouth 3 (three) times daily.   90 capsule   2   . GLUCOSE BLOOD VI STRP      Use as instructed for twice daily testing   100 each   5   . IBUPROFEN 200 MG PO TABS   Oral   Take 200-400 mg by mouth daily as needed. For pain          . ACCU-CHEK MULTICLIX LANCETS MISC      For twice daily testing   102 each   5   . METFORMIN HCL 500 MG PO TABS      TAKE ONE TABLET BY MOUTH TWICE DAILY WITH MEALS   60 tablet   2   . MOMETASONE FURO-FORMOTEROL FUM 100-5 MCG/ACT IN AERO   Inhalation   Inhale 2 puffs into the lungs 2 (two) times daily.   1 Inhaler   3   . MULTIVITAMINS PO CAPS   Oral   Take 1 capsule by mouth daily.         [name] NICOTINE 21 MG/24HR  TD PT24   Transdermal   Place 1 patch onto the skin daily.   28 patch   0   . OMEPRAZOLE 20 MG PO CPDR   Oral   Take 20 mg by mouth every  morning.         [name] ONDANSETRON HCL 8 MG PO TABS   Oral   Take 1 tablet (8 mg total) by mouth every 8 (eight) hours as needed for nausea.   10 tablet   0   . OXYCODONE-ACETAMINOPHEN 5-325 MG PO TABS   Oral   Take 1 tablet by mouth 3 (three) times daily as needed for pain.   90 tablet   0   . VALSARTAN 80 MG PO TABS   Oral   Take 40 mg by mouth daily.           Triage Vitals: BP 141/61  Pulse 121  Temp 100.3 F (37.9 C) (Oral)  Resp 32  Ht 5\' 4"  (1.626 m)  Wt 264 lb (119.75 kg)  BMI 45.32 kg/m2  SpO2 89%  Physical Exam  Nursing note and vitals reviewed. Constitutional: She is oriented to person, place, and time. She appears well-developed.  HENT:  Head: Normocephalic and atraumatic.  Eyes: Conjunctivae normal and EOM are normal. No scleral icterus.  Neck: Neck supple. No thyromegaly present.  Cardiovascular: Normal rate and regular rhythm.  Exam reveals no gallop and no friction rub.   No murmur heard. Pulmonary/Chest: Effort normal. No stridor. She has wheezes. She has no rales. She exhibits no tenderness.       Moderate wheezes bilaterally   Abdominal: She exhibits no distension. There is no tenderness. There is no rebound.  Musculoskeletal: Normal range of motion. She exhibits no edema.  Lymphadenopathy:    She has no cervical adenopathy.  Neurological: She is oriented to person, place, and time. Coordination normal.  Skin: No rash noted. No erythema.  Psychiatric: She has a normal mood and affect. Her behavior is normal.    ED Course  Procedures (including critical care time)  DIAGNOSTIC STUDIES: Oxygen Saturation is 89% on [place], low by my interpretation.    COORDINATION OF CARE:  3:51 PM: Discussed treatment plan which includes an EKG with pt at bedside and pt agreed to plan.     Labs Reviewed - No data to display No results found.   No diagnosis found.    MDM      The chart was scribed for me under my direct supervision.  I personally  performed the history, physical, and medical decision making and all procedures in the evaluation of this patient.Benny Lennert, MD 05/07/12 [phone]

## 2012-05-07 NOTE — H&P (Signed)
[facility] History and Physical  FELICITA NUNCIO  ZOX:[id number]  DOB: Jul 27, 1965   DOA: 05/07/2012   PCP:   Milinda Antis, MD   Chief Complaint:  Flulike symptoms and shortness of breath for 2 days  HPI: Shephanie A Elijah Birk is an 47 y.o. female.  Obese Caucasian lady with chronic respiratory failure related to asthma, presents with fever and chills, cough and generalized body aches and photophobia, the past 2 days. It is also been associated with wheezing which has been poorly responsive to her home dilators, ibuprofen and Robitussin.  Is productive of yellow sputum and in the emergency room chest x-ray was suggestive of pneumonia. She was discharged from this facility less than 3 months ago after treatment for acute on chronic respiratory failure related to bronchospastic disease.   Rewiew of Systems:   All systems negative except as marked bold or noted in the HPI;  Constitutional:  malaise, fever and chills. ;  Eyes: eye pain, redness and discharge. ;  ENMT: ear pain, hoarseness, nasal congestion, sinus pressure and sore throat. ;  Cardiovascular:  chest pain, palpitations, diaphoresis, dyspnea and peripheral edema. ;  Respiratory:  cough, hemoptysis, wheezing and stridor. ;  Gastrointestinal:  nausea, vomiting, diarrhea, constipation, abdominal pain, melena, blood in stool, hematemesis, jaundice and rectal bleeding. unusual weight loss..   Genitourinary: r frequency, dysuria, incontinence,flank pain and hematuria; Musculoskeletal:  back pain and neck pain.  swelling and trauma.;  Skin: .  pruritus, rash, abrasions, bruising and skin lesion.; ulcerations Neuro:  headache, lightheadedness and neck stiffness.  weakness, altered level of consciousness , altered mental status, extremity weakness, burning feet, involuntary movement, seizure and syncope.  Psych:  anxiety, depression, insomnia, tearfulness, panic attacks, hallucinations, paranoia, suicidal or homicidal  ideation   Past Medical History  Diagnosis Date  . Diabetes mellitus   . COPD (chronic obstructive pulmonary disease)   . HTN (hypertension)   . Low back pain   . Tachycardia     never had test done since no insurance  . Depression   . Asthma   . Shortness of breath   . Anxiety   . Gastric erosions     EGD 08/2010.  . Internal hemorrhoids     Colonoscopy 5/12.  [name] Heavy menses   . Chronic respiratory failure with hypoxia     On 2-3 L of oxygen at home  . GERD (gastroesophageal reflux disease)   . Anemia     Past Surgical History  Procedure Date  . Cholecystectomy 1990  . Cesarean section     twice  . Kidney surgery     as child for blockages  . Tubal ligation   . Tonsillectomy   . Wrist surgery 1995    Lt wrist  . Tympanostomy tube placement   . Hysteroscopy with thermachoice 01/17/2012    Procedure: HYSTEROSCOPY WITH THERMACHOICE;  Surgeon: Lazaro Arms, MD;  Location: [facility];  Service: Gynecology;  Laterality: N/A;  total therapy time: 9:13sec  D5W  18 ml in, D5W   18ml out, temperture 87degrees celcious  . Uterine ablation     Medications:  HOME MEDS: Prior to Admission medications   Medication Sig Start Date End Date Taking? Authorizing Provider  albuterol (PROVENTIL HFA) 108 (90 BASE) MCG/ACT inhaler Inhale 2 puffs into the lungs every 4 (four) hours as needed for wheezing or shortness of breath. Wheezing/shortnes of breath 11/09/11  Yes Salley Scarlet, MD  albuterol (PROVENTIL) (5 MG/ML) 0.5% nebulizer solution Take 2.5  mg by nebulization 4 (four) times daily as needed. For shortness of breath 10/25/11 10/24/12 Yes Erick Blinks, MD  ALPRAZolam Prudy Feeler) 0.5 MG tablet Take 1 tablet (0.5 mg total) by mouth 2 (two) times daily as needed for anxiety (And for tremulousness assoc with meds.). 03/07/12  Yes Salley Scarlet, MD  citalopram (CELEXA) 40 MG tablet Take 1 tablet (40 mg total) by mouth every morning. 03/07/12 03/07/13 Yes Salley Scarlet, MD  ferrous sulfate  (IRON SUPPLEMENT) 325 (65 FE) MG tablet Take 325 mg by mouth daily with breakfast.   Yes Historical Provider, MD  fluticasone (FLONASE) 50 MCG/ACT nasal spray Place 2 sprays into the nose daily. 01/11/12  Yes Salley Scarlet, MD  gabapentin (NEURONTIN) 300 MG capsule Take 1 capsule (300 mg total) by mouth 3 (three) times daily. 11/09/11 11/08/12 Yes Salley Scarlet, MD  ibuprofen (ADVIL,MOTRIN) 200 MG tablet Take 200-400 mg by mouth daily as needed. For pain    Yes Historical Provider, MD  metFORMIN (GLUCOPHAGE) 500 MG tablet Take 500 mg by mouth 2 (two) times daily with a meal.   Yes Historical Provider, MD  mometasone-formoterol (DULERA) 100-5 MCG/ACT AERO Inhale 2 puffs into the lungs 2 (two) times daily as needed. For shortness of breath 02/27/12 02/26/13 Yes Salley Scarlet, MD  Multiple Vitamin (MULTIVITAMIN) capsule Take 1 capsule by mouth daily.   Yes Historical Provider, MD  nicotine (NICODERM CQ - DOSED IN MG/24 HOURS) 21 mg/24hr patch Place 1 patch onto the skin daily. 02/20/12  Yes Erick Blinks, MD  omeprazole (PRILOSEC) 20 MG capsule Take 20 mg by mouth every morning.   Yes Historical Provider, MD  oxyCODONE-acetaminophen (PERCOCET/ROXICET) 5-325 MG per tablet Take 1 tablet by mouth 3 (three) times daily as needed for pain. 04/10/12 05/27/12 Yes Salley Scarlet, MD  valsartan (DIOVAN) 80 MG tablet Take 40 mg by mouth daily. 02/08/12  Yes Salley Scarlet, MD     Allergies:  Allergies  Allergen Reactions  . Codeine Itching and Nausea Only  . Wellbutrin (Bupropion Hcl) Hives    Social History:   reports that she has been smoking Cigarettes.  She has a 15 pack-year smoking history. She has never used smokeless tobacco. She reports that she drinks alcohol. She reports that she does not use illicit drugs.  Family History: Family History  Problem Relation Age of Onset  . Heart attack Father 72    deceased, etoh use  . Heart disease Father   . Heart attack Mother 40    deceased   . Diabetes Mother   . Breast cancer Mother   . Heart failure Mother     oxygen dependence, nonsmoker  . Heart disease Mother   . Depression Mother   . Cancer Mother   . Colon cancer Neg Hx   . Liver disease Maternal Aunt 40    died while on liver transplant list  . Heart attack Maternal Grandmother     premature CAD  . Ulcers Sister   . Hypertension Sister      Physical Exam: Filed Vitals:   05/07/12 1700 05/07/12 1751 05/07/12 1800 05/07/12 2053  BP: 129/58 129/58 117/65 134/58  Pulse: 111 108 109   Temp:    98.6 F (37 C)  TempSrc:    Oral  Resp: 26 22 24    Height:    5\' 4"  (1.626 m)  Weight:    115.2 kg (253 lb 15.5 oz)  SpO2: 95% 96% 95% 93%  Blood pressure 134/58, pulse 109, temperature 98.6 F (37 C), temperature source Oral, resp. rate 24, height 5\' 4"  (1.626 m), weight 115.2 kg (253 lb 15.5 oz), SpO2 93.00%.  GEN:  Pleasant obese Caucasian lady lying in bed ; coughing frequently; cooperative with exam PSYCH:  alert and oriented x4; does not appear anxious or depressed; affect is appropriate. HEENT: Mucous membranes pink and anicteric; PERRLA; EOM intact; no cervical lymphadenopathy nor thyromegaly or carotid bruit; no JVD; Breasts:: Not examined CHEST WALL: No tenderness CHEST: Tachypneic, generalized wheezing and rhonchi bilaterally HEART: Tachycardic Regular rate and rhythm; no murmurs rubs or gallops BACK: No kyphosis or scoliosis; no CVA tenderness ABDOMEN: Obese, soft non-tender; no masses, no organomegaly, normal abdominal bowel sounds;  no intertriginous candida. Rectal Exam: Not done EXTREMITIES: ; age-appropriate arthropathy of the hands and knees; no edema; no ulcerations. Genitalia: not examined PULSES: 2+ and symmetric SKIN: Normal hydration no rash or ulceration CNS: Cranial nerves 2-12 grossly intact no focal lateralizing neurologic deficit   Labs on Admission:  Basic Metabolic Panel:  Lab 05/07/12 4540  NA 132*  K 3.3*  CL 96  CO2 26   GLUCOSE 120*  BUN 6  CREATININE 0.62  CALCIUM 8.8  MG --  PHOS --   Liver Function Tests:  Lab 05/07/12 1605  AST 14  ALT 10  ALKPHOS 47  BILITOT 0.3  PROT 7.2  ALBUMIN 3.6   No results found for this basename: LIPASE:5,AMYLASE:5 in the last 168 hours No results found for this basename: AMMONIA:5 in the last 168 hours CBC:  Lab 05/07/12 1605  WBC 7.0  NEUTROABS 5.5  HGB 11.0*  HCT 36.4  MCV 74.0*  PLT 209   Cardiac Enzymes: No results found for this basename: CKTOTAL:5,CKMB:5,CKMBINDEX:5,TROPONINI:5 in the last 168 hours BNP: No components found with this basename: POCBNP:5 D-dimer: No components found with this basename: D-DIMER:5 CBG:  Lab 05/07/12 2047  GLUCAP 106*    Radiological Exams on Admission: Dg Chest Port 1 View  05/07/2012  *RADIOLOGY REPORT*  Clinical Data: Shortness of breath, chest pain  PORTABLE CHEST - 1 VIEW  Comparison: CT chest dated 02/17/2012  Findings: Mild patchy left midlung opacity, suspicious for pneumonia. No pleural effusion or pneumothorax.  The heart is normal in size.  IMPRESSION: Mild patchy left midlung opacity, suspicious for pneumonia.   Original Report Authenticated By: Charline Bills, M.D.      Assessment/Plan Present on Admission:  . Healthcare-associated pneumonia  Chronic respiratory failure . Influenza A (H1N1) . Asthmatic bronchitis with exacerbation  . Microcytic anemia . GERD (gastroesophageal reflux disease) . HTN (hypertension) . DM type 2 (diabetes mellitus, type 2) . Morbid obesity  PLAN: Admit this lady for treatment of her pneumonia, and pulmonary toilet with nebulizers, and mucolytics. Will not give steroids because of her viral infection, and diabetes, unless her bronchospasm is refractory.  Tamiflu for H1N! Infection, and supportive care  Other plans as per orders.  Code Status: FULL CODE  Disposition Plan: Discharge to home when near to baseline  Hovanes Hymas Nocturnist [facility] Pager [phone]   05/07/2012, 10:20 PM

## 2012-05-07 NOTE — ED Notes (Signed)
Sob , fever.cough, yellow sputum.  No nausea.  "light headed"

## 2012-05-07 NOTE — ED Notes (Signed)
CRITICAL VALUE ALERT  Critical value received:  + influenza A, +H1N1  Date of notification:  05/07/12  Time of notification:  1827  Critical value read back:yes  Nurse who received alert:  t Anessia [place] rn  MD notified (1st page):  [place]  Time of first page:  1827  MD notified (2nd page):  Time of second page:  Responding MD:    Time MD responded:

## 2012-05-08 DIAGNOSIS — F17200 Nicotine dependence, unspecified, uncomplicated: Secondary | ICD-10-CM

## 2012-05-08 DIAGNOSIS — E876 Hypokalemia: Secondary | ICD-10-CM | POA: Diagnosis present

## 2012-05-08 LAB — COMPREHENSIVE METABOLIC PANEL
ALT: 154 U/L — ABNORMAL HIGH (ref 0–35)
AST: 267 U/L — ABNORMAL HIGH (ref 0–37)
Albumin: 3.2 g/dL — ABNORMAL LOW (ref 3.5–5.2)
Alkaline Phosphatase: 85 U/L (ref 39–117)
BUN: 6 mg/dL (ref 6–23)
CO2: 29 mEq/L (ref 19–32)
Calcium: 8.4 mg/dL (ref 8.4–10.5)
Chloride: 99 mEq/L (ref 96–112)
Creatinine, Ser: 0.61 mg/dL (ref 0.50–1.10)
GFR calc Af Amer: >90 mL/min (ref >90)
GFR calc non Af Amer: >90 mL/min (ref >90)
Glucose, Bld: 127 mg/dL — ABNORMAL HIGH (ref 70–99)
Potassium: 3.3 mEq/L — ABNORMAL LOW (ref 3.5–5.1)
Sodium: 135 mEq/L (ref 135–145)
Total Bilirubin: 0.9 mg/dL (ref 0.3–1.2)
Total Protein: 6.7 g/dL (ref 6.0–8.3)

## 2012-05-08 LAB — CBC
HCT: 34.2 % — ABNORMAL LOW (ref 36.0–46.0)
Hemoglobin: 10.1 g/dL — ABNORMAL LOW (ref 12.0–15.0)
MCH: 22.4 pg — ABNORMAL LOW (ref 26.0–34.0)
MCHC: 29.5 g/dL — ABNORMAL LOW (ref 30.0–36.0)
MCV: 75.8 fL — ABNORMAL LOW (ref 78.0–100.0)
Platelets: 195 10*3/uL (ref 150–400)
RBC: 4.51 MIL/uL (ref 3.87–5.11)
RDW: 17.9 % — ABNORMAL HIGH (ref 11.5–15.5)
WBC: 4 10*3/uL (ref 4.0–10.5)

## 2012-05-08 LAB — URINE CULTURE
Colony Count: NO GROWTH
Culture: NO GROWTH

## 2012-05-08 LAB — GLUCOSE, CAPILLARY
Glucose-Capillary: 127 mg/dL — ABNORMAL HIGH (ref 70–99)
Glucose-Capillary: 102 mg/dL — ABNORMAL HIGH (ref 70–99)
Glucose-Capillary: 81 mg/dL (ref 70–99)
Glucose-Capillary: 149 mg/dL — ABNORMAL HIGH (ref 70–99)

## 2012-05-08 LAB — HEMOGLOBIN A1C
Hgb A1c MFr Bld: 6.2 % — ABNORMAL HIGH (ref <5.7)
Mean Plasma Glucose: 131 mg/dL — ABNORMAL HIGH (ref <117)

## 2012-05-08 LAB — STREP PNEUMONIAE URINARY ANTIGEN: Strep Pneumo Urinary Antigen: NEGATIVE

## 2012-05-08 MED ORDER — POTASSIUM CHLORIDE 10 MEQ/100ML IV SOLN
10.0000 meq | INTRAVENOUS | Status: AC
  Administered 2012-05-08 (×4): 10 meq via INTRAVENOUS
  Filled 2012-05-08 (×2): qty 100
  Filled 2012-05-08: qty 200

## 2012-05-08 MED ORDER — VANCOMYCIN HCL IN DEXTROSE 1-5 GM/200ML-% IV SOLN
1000.0000 mg | Freq: Three times a day (TID) | INTRAVENOUS | Status: DC
  Administered 2012-05-08 – 2012-05-09 (×3): 1000 mg via INTRAVENOUS
  Filled 2012-05-08 (×4): qty 200

## 2012-05-08 MED ORDER — OSELTAMIVIR PHOSPHATE 75 MG PO CAPS
75.0000 mg | ORAL_CAPSULE | Freq: Two times a day (BID) | ORAL | Status: DC
  Administered 2012-05-08 – 2012-05-10 (×5): 75 mg via ORAL
  Filled 2012-05-08 (×5): qty 1

## 2012-05-08 MED ORDER — IBUPROFEN 800 MG PO TABS
400.0000 mg | ORAL_TABLET | Freq: Three times a day (TID) | ORAL | Status: DC
  Administered 2012-05-08 – 2012-05-10 (×6): 400 mg via ORAL
  Filled 2012-05-08 (×6): qty 1

## 2012-05-08 MED ORDER — PREDNISONE 20 MG PO TABS
40.0000 mg | ORAL_TABLET | Freq: Two times a day (BID) | ORAL | Status: DC
  Administered 2012-05-08 – 2012-05-10 (×4): 40 mg via ORAL
  Filled 2012-05-08 (×2): qty 2
  Filled 2012-05-08: qty 1
  Filled 2012-05-08 (×2): qty 2

## 2012-05-08 MED ORDER — OXYCODONE-ACETAMINOPHEN 5-325 MG PO TABS
1.0000 | ORAL_TABLET | Freq: Four times a day (QID) | ORAL | Status: DC | PRN
  Administered 2012-05-08: 1 via ORAL
  Filled 2012-05-08: qty 1

## 2012-05-08 MED ORDER — GUAIFENESIN ER 600 MG PO TB12: 600.0000 mg | ORAL_TABLET | Freq: Two times a day (BID) | ORAL | Status: DC

## 2012-05-08 MED ORDER — OXYCODONE-ACETAMINOPHEN 5-325 MG PO TABS
1.0000 | ORAL_TABLET | ORAL | Status: DC | PRN
  Administered 2012-05-08 – 2012-05-10 (×9): 1 via ORAL
  Filled 2012-05-08 (×10): qty 1

## 2012-05-08 NOTE — Progress Notes (Signed)
UR Chart Review Completed  

## 2012-05-08 NOTE — Progress Notes (Signed)
ANTIBIOTIC CONSULT NOTE - INITIAL  Pharmacy Consult for Vancomycin, Cefepime, Levaquin Indication: pneumonia  Allergies  Allergen Reactions  . Codeine Itching and Nausea Only  . Wellbutrin (Bupropion Hcl) Hives    Patient Measurements: Height: 5\' 4"  (162.6 cm) Weight: 253 lb 8.5 oz (115 kg) IBW/kg (Calculated) : 54.7   Vital Signs: Temp: 97.6 F (36.4 C) (01/15 0544) Temp src: Oral (01/15 0544) BP: 113/63 mmHg (01/15 0544) Pulse Rate: 86  (01/15 0544) Intake/Output from previous day: 01/14 0701 - 01/15 0700 In: 450 [IV Piggyback:450] Out: -  Intake/Output from this shift:    Labs:  Basename 05/08/12 0552 05/07/12 1605  WBC 4.0 7.0  HGB 10.1* 11.0*  PLT 195 209  LABCREA -- --  CREATININE 0.61 0.62   Estimated Creatinine Clearance: 109.3 ml/min (by C-G formula based on Cr of 0.61). No results found for this basename: VANCOTROUGH:2,VANCOPEAK:2,VANCORANDOM:2,GENTTROUGH:2,GENTPEAK:2,GENTRANDOM:2,TOBRATROUGH:2,TOBRAPEAK:2,TOBRARND:2,AMIKACINPEAK:2,AMIKACINTROU:2,AMIKACIN:2, in the last 72 hours   Microbiology: No results found for this or any previous visit (from the past 720 hour(s)).  Medical History: Past Medical History  Diagnosis Date  . Diabetes mellitus   . COPD (chronic obstructive pulmonary disease)   . HTN (hypertension)   . Low back pain   . Tachycardia     never had test done since no insurance  . Depression   . Asthma   . Shortness of breath   . Anxiety   . Gastric erosions     EGD 08/2010.  . Internal hemorrhoids     Colonoscopy 5/12.  [name] Heavy menses   . Chronic respiratory failure with hypoxia     On 2-3 L of oxygen at home  . GERD (gastroesophageal reflux disease)   . Anemia     Medications:  Scheduled:    . albuterol  2.5 mg Nebulization Q4H WA  . [COMPLETED] albuterol  5 mg Nebulization Once  . ceFEPime (MAXIPIME) IV  1 g Intravenous Q8H  . citalopram  40 mg Oral q morning - 10a  . enoxaparin (LOVENOX) injection  40 mg Subcutaneous  QHS  . ferrous sulfate  325 mg Oral Q breakfast  . fluticasone  2 spray Each Nare Daily  . gabapentin  300 mg Oral TID  . guaiFENesin  1,200 mg Oral BID  . ibuprofen  400 mg Oral QID  . insulin aspart  0-5 Units Subcutaneous QHS  . insulin aspart  0-9 Units Subcutaneous TID WC  . [COMPLETED] ipratropium  0.5 mg Nebulization Once  . ipratropium  0.5 mg Nebulization Q4H WA  . irbesartan  75 mg Oral Daily  . levofloxacin (LEVAQUIN) IV  750 mg Intravenous Q24H  . metFORMIN  500 mg Oral BID WC  . mometasone-formoterol  2 puff Inhalation BID  . nicotine  21 mg Transdermal Daily  . oseltamivir  75 mg Oral BID  . [COMPLETED] oxyCODONE-acetaminophen  1 tablet Oral Once  . pantoprazole  40 mg Oral Daily  . [COMPLETED] piperacillin-tazobactam (ZOSYN)  IV  3.375 g Intravenous Once  . [COMPLETED] sodium chloride  1,000 mL Intravenous Once  . [COMPLETED] vancomycin  1,000 mg Intravenous Once  . [COMPLETED] vancomycin  1,000 mg Intravenous Once  . vancomycin  1,000 mg Intravenous Q8H   Assessment: 47yo obese female admitted with suspected pna.  Pt c/o fever and chills.  Has good renal fxn.  Estimated Creatinine Clearance: 109.3 ml/min (by C-G formula based on Cr of 0.61).  Goal of Therapy:  Vancomycin trough level 15-20 mcg/ml  Plan: Vancomycin 1gm IV q8hrs Check trough  at steady state (tomorrow) Levaquin 750mg  IV q24hrs Cefepime 1gm IV q8hrs Monitor labs, renal fxn, and cultures per protocol Duration of therapy per MD (anticipate 8 days)  Valrie Hart A 05/08/2012,7:48 AM

## 2012-05-08 NOTE — Progress Notes (Addendum)
Chart reviewed.  Subjective: Complaining of chest pain from cough. Requesting Percocet rather than Dilaudid. Unable to expectorate sputum. Had some nausea this morning. Better after Zofran. Still smoking a half a pack of cigarettes a day. Feels short of breath and wheezing. Wondering why she is not on steroids.  Objective: Vital signs in last 24 hours: Filed Vitals:   05/08/12 0500 05/08/12 0502 05/08/12 0544 05/08/12 0801  BP:   113/63   Pulse:   86   Temp:   97.6 F (36.4 C)   TempSrc:   Oral   Resp:   20   Height:      Weight: 115 kg (253 lb 8.5 oz)     SpO2:  95% 91% 89%   Weight change:   Intake/Output Summary (Last 24 hours) at 05/08/12 1558 Last data filed at 05/08/12 1100  Gross per 24 hour  Intake    450 ml  Output   1000 ml  Net   -550 ml   General: Coughing. Alert and oriented. Lungs: Wheeze, congestion, rhonchi Cardiovascular regular rate rhythm without murmurs gallops rubs Abdomen soft nontender nondistended Extremities no clubbing cyanosis or edema Lab Results: Basic Metabolic Panel:  Lab 05/08/12 5284 05/07/12 1605  NA 135 132*  K 3.3* 3.3*  CL 99 96  CO2 29 26  GLUCOSE 127* 120*  BUN 6 6  CREATININE 0.61 0.62  CALCIUM 8.4 8.8  MG -- --  PHOS -- --   Liver Function Tests:  Lab 05/08/12 0552 05/07/12 1605  AST 267* 14  ALT 154* 10  ALKPHOS 85 47  BILITOT 0.9 0.3  PROT 6.7 7.2  ALBUMIN 3.2* 3.6   No results found for this basename: LIPASE:2,AMYLASE:2 in the last 168 hours No results found for this basename: AMMONIA:2 in the last 168 hours CBC:  Lab 05/08/12 0552 05/07/12 1605  WBC 4.0 7.0  NEUTROABS -- 5.5  HGB 10.1* 11.0*  HCT 34.2* 36.4  MCV 75.8* 74.0*  PLT 195 209  CBG:  Lab 05/08/12 1122 05/08/12 0733 05/07/12 2047  GLUCAP 102* 127* 106*   Hemoglobin A1C:  Lab 05/07/12 2213  HGBA1C 6.2*  Urinalysis:  Lab 05/07/12 1953  COLORURINE YELLOW  LABSPEC 1.020  PHURINE 5.5  GLUCOSEU NEGATIVE  HGBUR SMALL*  BILIRUBINUR  NEGATIVE  KETONESUR NEGATIVE  PROTEINUR NEGATIVE  UROBILINOGEN 0.2  NITRITE NEGATIVE  LEUKOCYTESUR NEGATIVE   Micro Results: Recent Results (from the past 240 hour(s))  CULTURE, BLOOD (ROUTINE X 2)     Status: Normal (Preliminary result)   Collection Time   05/07/12  4:09 PM      Component Value Range Status Comment   Specimen Description BLOOD LEFT ANTECUBITAL   Final    Special Requests     Final    Value: BOTTLES DRAWN AEROBIC AND ANAEROBIC AEB=12CC ANA=8CC   Culture NO GROWTH 1 DAY   Final    Report Status PENDING   Incomplete   CULTURE, BLOOD (ROUTINE X 2)     Status: Normal (Preliminary result)   Collection Time   05/07/12  4:10 PM      Component Value Range Status Comment   Specimen Description BLOOD RIGHT ANTECUBITAL DRAWN BY RN   Final    Special Requests     Final    Value: BOTTLES DRAWN AEROBIC AND ANAEROBIC AEB=11CC ANA=7CC   Culture NO GROWTH 1 DAY   Final    Report Status PENDING   Incomplete    Studies/Results: Dg Chest Port 1 [facility]  05/07/2012  *RADIOLOGY REPORT*  Clinical Data: Shortness of breath, chest pain  PORTABLE CHEST - 1 VIEW  Comparison: CT chest dated 02/17/2012  Findings: Mild patchy left midlung opacity, suspicious for pneumonia. No pleural effusion or pneumothorax.  The heart is normal in size.  IMPRESSION: Mild patchy left midlung opacity, suspicious for pneumonia.   Original Report Authenticated By: Charline Bills, M.D.    Scheduled Meds:   . albuterol  2.5 mg Nebulization Q4H WA  . ceFEPime (MAXIPIME) IV  1 g Intravenous Q8H  . citalopram  40 mg Oral q morning - 10a  . enoxaparin (LOVENOX) injection  40 mg Subcutaneous QHS  . ferrous sulfate  325 mg Oral Q breakfast  . fluticasone  2 spray Each Nare Daily  . gabapentin  300 mg Oral TID  . guaiFENesin  1,200 mg Oral BID  . ibuprofen  400 mg Oral QID  . insulin aspart  0-5 Units Subcutaneous QHS  . insulin aspart  0-9 Units Subcutaneous TID WC  . ipratropium  0.5 mg Nebulization Q4H WA  .  irbesartan  75 mg Oral Daily  . levofloxacin (LEVAQUIN) IV  750 mg Intravenous Q24H  . metFORMIN  500 mg Oral BID WC  . mometasone-formoterol  2 puff Inhalation BID  . nicotine  21 mg Transdermal Daily  . oseltamivir  75 mg Oral BID  . pantoprazole  40 mg Oral Daily  . vancomycin  1,000 mg Intravenous Q8H   Continuous Infusions:  PRN Meds:.acetaminophen, ALPRAZolam, benzonatate, HYDROmorphone (DILAUDID) injection, ondansetron (ZOFRAN) IV Assessment/Plan: Principal Problem:  *Healthcare-associated pneumonia: Continue levofloxacin and vancomycin. Could be staph pneumonia. Discontinue cefepime. Active Problems:  HTN (hypertension) controlled  DM type 2 (diabetes mellitus, type 2) controlled  Microcytic anemia  GERD (gastroesophageal reflux disease)  Tobacco abuse, counseled against  Morbid obesity  Hypokalemia: Replete IV. COPD exacerbation: Will give prednisone twice a day and taper quickly. continue bronchodilators.  Chronic respiratory failure  Influenza A (H1N1): Continue Tamiflu. Increased liver function tests likely secondary to infection. Repeat tomorrow.   pleuritic chest pain: Continue nonsteroidal anti-inflammatories. Resume patient's home Percocet. Stop a lot of.   LOS: 1 day   Oziel Beitler L 05/08/2012, 3:58 PM

## 2012-05-09 ENCOUNTER — Telehealth: Payer: Self-pay | Admitting: Family Medicine

## 2012-05-09 DIAGNOSIS — I10 Essential (primary) hypertension: Secondary | ICD-10-CM

## 2012-05-09 DIAGNOSIS — J441 Chronic obstructive pulmonary disease with (acute) exacerbation: Secondary | ICD-10-CM

## 2012-05-09 LAB — COMPREHENSIVE METABOLIC PANEL
ALT: 99 U/L — ABNORMAL HIGH (ref 0–35)
AST: 75 U/L — ABNORMAL HIGH (ref 0–37)
Albumin: 3.2 g/dL — ABNORMAL LOW (ref 3.5–5.2)
Alkaline Phosphatase: 92 U/L (ref 39–117)
BUN: 9 mg/dL (ref 6–23)
CO2: 28 mEq/L (ref 19–32)
Calcium: 9.2 mg/dL (ref 8.4–10.5)
Chloride: 103 mEq/L (ref 96–112)
Creatinine, Ser: 0.6 mg/dL (ref 0.50–1.10)
GFR calc Af Amer: >90 mL/min (ref >90)
GFR calc non Af Amer: >90 mL/min (ref >90)
Glucose, Bld: 128 mg/dL — ABNORMAL HIGH (ref 70–99)
Potassium: 4.6 mEq/L (ref 3.5–5.1)
Sodium: 140 mEq/L (ref 135–145)
Total Bilirubin: 0.2 mg/dL — ABNORMAL LOW (ref 0.3–1.2)
Total Protein: 6.6 g/dL (ref 6.0–8.3)

## 2012-05-09 LAB — LEGIONELLA ANTIGEN, URINE: Legionella Antigen, Urine: NEGATIVE

## 2012-05-09 LAB — EXPECTORATED SPUTUM ASSESSMENT W REFEX TO RESP CULTURE

## 2012-05-09 LAB — GLUCOSE, CAPILLARY
Glucose-Capillary: 124 mg/dL — ABNORMAL HIGH (ref 70–99)
Glucose-Capillary: 108 mg/dL — ABNORMAL HIGH (ref 70–99)
Glucose-Capillary: 200 mg/dL — ABNORMAL HIGH (ref 70–99)
Glucose-Capillary: 229 mg/dL — ABNORMAL HIGH (ref 70–99)

## 2012-05-09 LAB — EXPECTORATED SPUTUM ASSESSMENT W GRAM STAIN, RFLX TO RESP C

## 2012-05-09 LAB — VANCOMYCIN, RANDOM: Vancomycin Rm: 13.5 ug/mL

## 2012-05-09 MED ORDER — VANCOMYCIN HCL 10 G IV SOLR
1250.0000 mg | Freq: Three times a day (TID) | INTRAVENOUS | Status: DC
  Administered 2012-05-09 – 2012-05-10 (×3): 1250 mg via INTRAVENOUS
  Filled 2012-05-09 (×13): qty 1250

## 2012-05-09 NOTE — Progress Notes (Signed)
ANTIBIOTIC CONSULT NOTE - INITIAL  Pharmacy Consult for Vancomycin & Levaquin Indication: pneumonia  Allergies  Allergen Reactions  . Codeine Itching and Nausea Only  . Wellbutrin (Bupropion Hcl) Hives    Patient Measurements: Height: 5\' 4"  (162.6 cm) Weight: 253 lb 8.5 oz (115 kg) IBW/kg (Calculated) : 54.7   Vital Signs: Temp: 98.2 F (36.8 C) (01/16 0500) Temp src: Oral (01/16 0500) BP: 134/64 mmHg (01/16 0500) Pulse Rate: 82  (01/16 0500) Intake/Output from previous day: 01/15 0701 - 01/16 0700 In: 750 [IV Piggyback:750] Out: 1000 [Urine:1000] Intake/Output from this shift:    Labs:  Basename 05/09/12 0444 05/08/12 0552 05/07/12 1605  WBC -- 4.0 7.0  HGB -- 10.1* 11.0*  PLT -- 195 209  LABCREA -- -- --  CREATININE 0.60 0.61 0.62   Estimated Creatinine Clearance: 109.3 ml/min (by C-G formula based on Cr of 0.6).  Basename 05/09/12 0355  VANCOTROUGH --  Leodis Binet --  VANCORANDOM 13.5  GENTTROUGH --  GENTPEAK --  GENTRANDOM --  TOBRATROUGH --  TOBRAPEAK --  TOBRARND --  AMIKACINPEAK --  AMIKACINTROU --  AMIKACIN --     Microbiology: Recent Results (from the past 720 hour(s))  CULTURE, BLOOD (ROUTINE X 2)     Status: Normal (Preliminary result)   Collection Time   05/07/12  4:09 PM      Component Value Range Status Comment   Specimen Description BLOOD LEFT ANTECUBITAL   Final    Special Requests     Final    Value: BOTTLES DRAWN AEROBIC AND ANAEROBIC AEB=12CC ANA=8CC   Culture NO GROWTH 1 DAY   Final    Report Status PENDING   Incomplete   CULTURE, BLOOD (ROUTINE X 2)     Status: Normal (Preliminary result)   Collection Time   05/07/12  4:10 PM      Component Value Range Status Comment   Specimen Description BLOOD RIGHT ANTECUBITAL DRAWN BY RN   Final    Special Requests     Final    Value: BOTTLES DRAWN AEROBIC AND ANAEROBIC AEB=11CC ANA=7CC   Culture NO GROWTH 1 DAY   Final    Report Status PENDING   Incomplete   URINE CULTURE     Status:  Normal   Collection Time   05/07/12  7:53 PM      Component Value Range Status Comment   Specimen Description URINE, CLEAN CATCH   Final    Special Requests NONE   Final    Culture  Setup Time 05/07/2012 20:30   Final    Colony Count NO GROWTH   Final    Culture NO GROWTH   Final    Report Status 05/08/2012 FINAL   Final     Medical History: Past Medical History  Diagnosis Date  . Diabetes mellitus   . COPD (chronic obstructive pulmonary disease)   . HTN (hypertension)   . Low back pain   . Tachycardia     never had test done since no insurance  . Depression   . Asthma   . Shortness of breath   . Anxiety   . Gastric erosions     EGD 08/2010.  . Internal hemorrhoids     Colonoscopy 5/12.  [name] Heavy menses   . Chronic respiratory failure with hypoxia     On 2-3 L of oxygen at home  . GERD (gastroesophageal reflux disease)   . Anemia     Medications:  Scheduled:     .  albuterol  2.5 mg Nebulization Q4H WA  . citalopram  40 mg Oral q morning - 10a  . enoxaparin (LOVENOX) injection  40 mg Subcutaneous QHS  . ferrous sulfate  325 mg Oral Q breakfast  . fluticasone  2 spray Each Nare Daily  . gabapentin  300 mg Oral TID  . guaiFENesin  1,200 mg Oral BID  . ibuprofen  400 mg Oral TID AC  . insulin aspart  0-5 Units Subcutaneous QHS  . insulin aspart  0-9 Units Subcutaneous TID WC  . ipratropium  0.5 mg Nebulization Q4H WA  . irbesartan  75 mg Oral Daily  . levofloxacin (LEVAQUIN) IV  750 mg Intravenous Q24H  . metFORMIN  500 mg Oral BID WC  . mometasone-formoterol  2 puff Inhalation BID  . nicotine  21 mg Transdermal Daily  . oseltamivir  75 mg Oral BID  . pantoprazole  40 mg Oral Daily  . [COMPLETED] potassium chloride  10 mEq Intravenous Q1 Hr x 4  . predniSONE  40 mg Oral BID  . vancomycin  1,000 mg Intravenous Q8H  . [DISCONTINUED] ceFEPime (MAXIPIME) IV  1 g Intravenous Q8H  . [DISCONTINUED] guaiFENesin  600 mg Oral BID  . [DISCONTINUED] ibuprofen  400 mg Oral  QID   Assessment: 47yo obese female on day#3 Vancomycin & Levaquin for PNA.  Her renal function has been stable. Vancomycin trough is slightly less than goal range.    Goal of Therapy:  Vancomycin trough level 15-20 mcg/ml  Plan: Increase Vancomycin 1250mg  IV q8hrs x 8 days Levaquin 750mg  IV q24hrs x 3 days Monitor renal fxn and cultures data  [name] 05/09/2012,8:00 AM

## 2012-05-09 NOTE — Progress Notes (Signed)
Subjective: This lady is slowly improving. She is coughing but the sputum is now not as colored as it was before. Her breathing is slowly improving also.           Physical Exam: Blood pressure 134/64, pulse 82, temperature 98.2 F (36.8 C), temperature source Oral, resp. rate 16, height 5\' 4"  (1.626 m), weight 115 kg (253 lb 8.5 oz), SpO2 94.00%. She does look systemically well. She is not toxic or septic. There is no increased work of breathing. Lung fields show bilateral wheezing but her chest is not tight. There are no crackles or bronchial breathing. There is no peripheral central cyanosis. Heart sounds are present and without murmurs. There is no gallop rhythm. She is alert and oriented.   Investigations:  Recent Results (from the past 240 hour(s))  CULTURE, BLOOD (ROUTINE X 2)     Status: Normal (Preliminary result)   Collection Time   05/07/12  4:09 PM      Component Value Range Status Comment   Specimen Description BLOOD LEFT ANTECUBITAL   Final    Special Requests     Final    Value: BOTTLES DRAWN AEROBIC AND ANAEROBIC AEB=12CC ANA=8CC   Culture NO GROWTH 1 DAY   Final    Report Status PENDING   Incomplete   CULTURE, BLOOD (ROUTINE X 2)     Status: Normal (Preliminary result)   Collection Time   05/07/12  4:10 PM      Component Value Range Status Comment   Specimen Description BLOOD RIGHT ANTECUBITAL DRAWN BY RN   Final    Special Requests     Final    Value: BOTTLES DRAWN AEROBIC AND ANAEROBIC AEB=11CC ANA=7CC   Culture NO GROWTH 1 DAY   Final    Report Status PENDING   Incomplete   URINE CULTURE     Status: Normal   Collection Time   05/07/12  7:53 PM      Component Value Range Status Comment   Specimen Description URINE, CLEAN CATCH   Final    Special Requests NONE   Final    Culture  Setup Time 05/07/2012 20:30   Final    Colony Count NO GROWTH   Final    Culture NO GROWTH   Final    Report Status 05/08/2012 FINAL   Final   CULTURE, EXPECTORATED  SPUTUM-ASSESSMENT     Status: Normal   Collection Time   05/09/12  8:11 AM      Component Value Range Status Comment   Specimen Description SPUTUM EXPECTORATED   Final    Special Requests NONE   Final    Sputum evaluation     Final    Value: THIS SPECIMEN IS ACCEPTABLE. RESPIRATORY CULTURE REPORT TO FOLLOW.     Performed at [facility]   Report Status 05/09/2012 FINAL   Final      Basic Metabolic Panel:  Basename 05/09/12 0444 05/08/12 0552  NA 140 135  K 4.6 3.3*  CL 103 99  CO2 28 29  GLUCOSE 128* 127*  BUN 9 6  CREATININE 0.60 0.61  CALCIUM 9.2 8.4  MG -- --  PHOS -- --   Liver Function Tests:  [facility] 05/09/12 0444 05/08/12 0552  AST 75* 267*  ALT 99* 154*  ALKPHOS 92 85  BILITOT 0.2* 0.9  PROT 6.6 6.7  ALBUMIN 3.2* 3.2*     CBC:  Basename 05/08/12 0552 05/07/12 1605  WBC 4.0 7.0  NEUTROABS -- 5.5  HGB 10.1* 11.0*  HCT 34.2* 36.4  MCV 75.8* 74.0*  PLT 195 209    Dg Chest Port 1 View  05/07/2012  *RADIOLOGY REPORT*  Clinical Data: Shortness of breath, chest pain  PORTABLE CHEST - 1 VIEW  Comparison: CT chest dated 02/17/2012  Findings: Mild patchy left midlung opacity, suspicious for pneumonia. No pleural effusion or pneumothorax.  The heart is normal in size.  IMPRESSION: Mild patchy left midlung opacity, suspicious for pneumonia.   Original Report Authenticated By: Charline Bills, M.D.       Medications: I have reviewed the patient's current medications.  Impression: 1. Healthcare associated pneumonia. 2. Asthma/COPD. 3. Hypertension. 4 per type 2 diabetes mellitus. 5. Morbid obesity. 6. Influenza A 7. Abnormal LFTs, improving.     Plan: 1. Continue with current therapy. 2. Possible discharge home tomorrow if she continues to improve in this fashion.     LOS: 2 days   Wilson Singer Pager [phone]  05/09/2012, 12:24 PM

## 2012-05-10 ENCOUNTER — Other Ambulatory Visit: Payer: Self-pay

## 2012-05-10 LAB — COMPREHENSIVE METABOLIC PANEL
ALT: 61 U/L — ABNORMAL HIGH (ref 0–35)
AST: 28 U/L (ref 0–37)
Albumin: 3.1 g/dL — ABNORMAL LOW (ref 3.5–5.2)
Alkaline Phosphatase: 66 U/L (ref 39–117)
BUN: 7 mg/dL (ref 6–23)
CO2: 28 mEq/L (ref 19–32)
Calcium: 9.2 mg/dL (ref 8.4–10.5)
Chloride: 103 mEq/L (ref 96–112)
Creatinine, Ser: 0.56 mg/dL (ref 0.50–1.10)
GFR calc Af Amer: >90 mL/min (ref >90)
GFR calc non Af Amer: >90 mL/min (ref >90)
Glucose, Bld: 201 mg/dL — ABNORMAL HIGH (ref 70–99)
Potassium: 4.2 mEq/L (ref 3.5–5.1)
Sodium: 139 mEq/L (ref 135–145)
Total Bilirubin: 0.2 mg/dL — ABNORMAL LOW (ref 0.3–1.2)
Total Protein: 6.8 g/dL (ref 6.0–8.3)

## 2012-05-10 LAB — GLUCOSE, CAPILLARY
Glucose-Capillary: 161 mg/dL — ABNORMAL HIGH (ref 70–99)
Glucose-Capillary: 134 mg/dL — ABNORMAL HIGH (ref 70–99)

## 2012-05-10 LAB — CBC
HCT: 33.9 % — ABNORMAL LOW (ref 36.0–46.0)
Hemoglobin: 10.1 g/dL — ABNORMAL LOW (ref 12.0–15.0)
MCH: 22.3 pg — ABNORMAL LOW (ref 26.0–34.0)
MCHC: 29.8 g/dL — ABNORMAL LOW (ref 30.0–36.0)
MCV: 75 fL — ABNORMAL LOW (ref 78.0–100.0)
Platelets: 232 10*3/uL (ref 150–400)
RBC: 4.52 MIL/uL (ref 3.87–5.11)
RDW: 18.1 % — ABNORMAL HIGH (ref 11.5–15.5)
WBC: 13.2 10*3/uL — ABNORMAL HIGH (ref 4.0–10.5)

## 2012-05-10 MED ORDER — OSELTAMIVIR PHOSPHATE 75 MG PO CAPS: 75.0000 mg | ORAL_CAPSULE | Freq: Two times a day (BID) | ORAL | Status: DC

## 2012-05-10 MED ORDER — PREDNISONE 20 MG PO TABS: ORAL_TABLET | ORAL | Status: DC

## 2012-05-10 MED ORDER — BENZONATATE 200 MG PO CAPS: 200.0000 mg | ORAL_CAPSULE | Freq: Three times a day (TID) | ORAL | Status: DC | PRN

## 2012-05-10 MED ORDER — OXYCODONE-ACETAMINOPHEN 5-325 MG PO TABS: 1.0000 | ORAL_TABLET | Freq: Three times a day (TID) | ORAL | Status: DC | PRN

## 2012-05-10 MED ORDER — GUAIFENESIN ER 600 MG PO TB12: 1200.0000 mg | ORAL_TABLET | Freq: Two times a day (BID) | ORAL | Status: DC

## 2012-05-10 MED ORDER — LEVOFLOXACIN 500 MG PO TABS: 500.0000 mg | ORAL_TABLET | Freq: Every day | ORAL | Status: DC

## 2012-05-10 NOTE — Care Management Note (Signed)
    Page 1 of 1   05/10/2012     3:01:21 PM   CARE MANAGEMENT NOTE 05/10/2012  Patient:  [name]   Account Number:  [id number]  Date Initiated:  05/10/2012  Documentation initiated by:  Rosemary Holms  Subjective/Objective Assessment:   Pt admitted from home with flu and Pna. Pt dc to home. Needs assistance with medications. Does not have insurance     Action/Plan:   Anticipated DC Date:  05/10/2012   Anticipated DC Plan:  HOME/SELF CARE      DC Planning Services  CM consult  MATCH Program      Choice offered to / List presented to:             Status of service:  Completed, signed off Medicare Important Message given?   (If response is "NO", the following Medicare IM given date fields will be blank) Date Medicare IM given:   Date Additional Medicare IM given:    Discharge Disposition:  HOME/SELF CARE  Per UR Regulation:    If discussed at Long Length of Stay Meetings, dates discussed:    Comments:  05/10/12 Rosemary Holms RN BSN CM Pt appreciative of assistance with [facility] program.

## 2012-05-10 NOTE — Telephone Encounter (Signed)
Patient aware that she can collect rx after being discharged.

## 2012-05-10 NOTE — Progress Notes (Signed)
Pt verbalizes understanding of d/c instructions, follow up appt, and prescriptions. No questions at this time. Voucher received to help with payment of medications. IV d/c. Pt d/c via wheelchair by NT. Sheryn Bison

## 2012-05-10 NOTE — Discharge Summary (Signed)
Physician Discharge Summary  KALIANN CORYELL ZOX:[id number] DOB: [birth year] DOA: 05/07/2012  PCP: Milinda Antis, MD  Admit date: 05/07/2012 Discharge date: 05/10/2012  Time spent: Greater than 30 minutes  Recommendations for Outpatient Follow-up:  1. Follow with primary care physician in one week.  Discharge Diagnoses:  1. Left-sided healthcare associated pneumonia, clinically improving. 2. Exacerbation of COPD/asthma. 3. Ongoing tobacco abuse. 4. Influenza H1N1. 5. Hypertension. 6. Type 2 diabetes mellitus.   Discharge Condition: Stable and improving.  Diet recommendation:  Carbohydrate modified diet.  Filed Weights   05/07/12 1544 05/07/12 2053 05/08/12 0500  Weight: 119.75 kg (264 lb) 115.2 kg (253 lb 15.5 oz) 115 kg (253 lb 8.5 oz)    History of present illness:  This 47 year old lady presents to the hospital with symptoms of flulike symptoms and dyspnea for 2 days. Please see initial history as outlined below: Emilly A Elijah Birk is an 47 y.o. female. Obese Caucasian lady with chronic respiratory failure related to asthma, presents with fever and chills, cough and generalized body aches and photophobia, the past 2 days. It is also been associated with wheezing which has been poorly responsive to her home dilators, ibuprofen and Robitussin.  Is productive of yellow sputum and in the emergency room chest x-ray was suggestive of pneumonia. She was discharged from this facility less than 3 months ago after treatment for acute on chronic respiratory failure related to bronchospastic disease.  Hospital Course:  The patient was admitted and started on intravenous antibiotics, steroids and bronchodilators. She required very little in the way of supplemental oxygen, she normally is on 2 L oxygen per minute at home and this remained largely unchanged throughout the hospitalization. Chest x-ray was negative her left-sided infiltrate. She made a slow but gradual improvement and this  morning she feels better than yesterday. She still is coughing up sputum but her breathing is improved.  Procedures:  None.   Consultations:  None.  Discharge Exam: Filed Vitals:   05/09/12 1925 05/09/12 2114 05/10/12 0500 05/10/12 0728  BP:  114/72 137/68   Pulse:  86 95   Temp:  98.4 F (36.9 C) 97.9 F (36.6 C)   TempSrc:  Oral Oral   Resp:  20 22   Height:      Weight:      SpO2: 92% 96% 94% 92%    General: She looks systemically well. She is not toxic or septic.  Cardiovascular: Heart sounds are present without any murmurs or gallop rhythm. Respiratory: Lung fields show bilateral wheezing but her chest is not tight. There is no bronchial breathing. There are no crackles. There is no significant increased work of breathing and she does not have peripheral or central cyanosis.  Discharge Instructions  Discharge Orders    Future Orders Please Complete By Expires   Diet - low sodium heart healthy      Increase activity slowly          Medication List     As of 05/10/2012  8:48 AM    TAKE these medications         albuterol (5 MG/ML) 0.5% nebulizer solution   Commonly known as: PROVENTIL   Take 2.5 mg by nebulization 4 (four) times daily as needed. For shortness of breath      albuterol 108 (90 BASE) MCG/ACT inhaler   Commonly known as: PROVENTIL HFA;VENTOLIN HFA   Inhale 2 puffs into the lungs every 4 (four) hours as needed for wheezing or shortness of breath. Wheezing/shortnes of  breath      ALPRAZolam 0.5 MG tablet   Commonly known as: XANAX   Take 1 tablet (0.5 mg total) by mouth 2 (two) times daily as needed for anxiety (And for tremulousness assoc with meds.).      benzonatate 200 MG capsule   Commonly known as: TESSALON   Take 1 capsule (200 mg total) by mouth 3 (three) times daily as needed for cough.      citalopram 40 MG tablet   Commonly known as: CELEXA   Take 1 tablet (40 mg total) by mouth every morning.      fluticasone 50 MCG/ACT nasal  spray   Commonly known as: FLONASE   Place 2 sprays into the nose daily.      gabapentin 300 MG capsule   Commonly known as: NEURONTIN   Take 1 capsule (300 mg total) by mouth 3 (three) times daily.      guaiFENesin 600 MG 12 hr tablet   Commonly known as: MUCINEX   Take 2 tablets (1,200 mg total) by mouth 2 (two) times daily.      ibuprofen 200 MG tablet   Commonly known as: ADVIL,MOTRIN   Take 200-400 mg by mouth daily as needed. For pain      IRON SUPPLEMENT 325 (65 FE) MG tablet   Generic drug: ferrous sulfate   Take 325 mg by mouth daily with breakfast.      levofloxacin 500 MG tablet   Commonly known as: LEVAQUIN   Take 1 tablet (500 mg total) by mouth daily.      metFORMIN 500 MG tablet   Commonly known as: GLUCOPHAGE   Take 500 mg by mouth 2 (two) times daily with a meal.      mometasone-formoterol 100-5 MCG/ACT Aero   Commonly known as: DULERA   Inhale 2 puffs into the lungs 2 (two) times daily as needed. For shortness of breath      multivitamin capsule   Take 1 capsule by mouth daily.      nicotine 21 mg/24hr patch   Commonly known as: NICODERM CQ - dosed in mg/24 hours   Place 1 patch onto the skin daily.      omeprazole 20 MG capsule   Commonly known as: PRILOSEC   Take 20 mg by mouth every morning.      oseltamivir 75 MG capsule   Commonly known as: TAMIFLU   Take 1 capsule (75 mg total) by mouth 2 (two) times daily.      oxyCODONE-acetaminophen 5-325 MG per tablet   Commonly known as: PERCOCET/ROXICET   Take 1 tablet by mouth 3 (three) times daily as needed for pain.      predniSONE 20 MG tablet   Commonly known as: DELTASONE   Take 2 tablets daily for 3 days, then 1 tablet daily for 3 days, then half tablet daily for 3 days, then STOP.      valsartan 80 MG tablet   Commonly known as: DIOVAN   Take 40 mg by mouth daily.          The results of significant diagnostics from this hospitalization (including imaging, microbiology, ancillary  and laboratory) are listed below for reference.    Significant Diagnostic Studies: Dg Chest Port 1 View  05/07/2012  *RADIOLOGY REPORT*  Clinical Data: Shortness of breath, chest pain  PORTABLE CHEST - 1 VIEW  Comparison: CT chest dated 02/17/2012  Findings: Mild patchy left midlung opacity, suspicious for pneumonia. No pleural effusion or  pneumothorax.  The heart is normal in size.  IMPRESSION: Mild patchy left midlung opacity, suspicious for pneumonia.   Original Report Authenticated By: Charline Bills, M.D.     Microbiology: Recent Results (from the past 240 hour(s))  CULTURE, BLOOD (ROUTINE X 2)     Status: Normal (Preliminary result)   Collection Time   05/07/12  4:09 PM      Component Value Range Status Comment   Specimen Description BLOOD LEFT ANTECUBITAL   Final    Special Requests     Final    Value: BOTTLES DRAWN AEROBIC AND ANAEROBIC AEB=12CC ANA=8CC   Culture NO GROWTH 1 DAY   Final    Report Status PENDING   Incomplete   CULTURE, BLOOD (ROUTINE X 2)     Status: Normal (Preliminary result)   Collection Time   05/07/12  4:10 PM      Component Value Range Status Comment   Specimen Description BLOOD RIGHT ANTECUBITAL DRAWN BY RN   Final    Special Requests     Final    Value: BOTTLES DRAWN AEROBIC AND ANAEROBIC AEB=11CC ANA=7CC   Culture NO GROWTH 1 DAY   Final    Report Status PENDING   Incomplete   URINE CULTURE     Status: Normal   Collection Time   05/07/12  7:53 PM      Component Value Range Status Comment   Specimen Description URINE, CLEAN CATCH   Final    Special Requests NONE   Final    Culture  Setup Time 05/07/2012 20:30   Final    Colony Count NO GROWTH   Final    Culture NO GROWTH   Final    Report Status 05/08/2012 FINAL   Final   CULTURE, EXPECTORATED SPUTUM-ASSESSMENT     Status: Normal   Collection Time   05/09/12  8:11 AM      Component Value Range Status Comment   Specimen Description SPUTUM EXPECTORATED   Final    Special Requests NONE   Final     Sputum evaluation     Final    Value: THIS SPECIMEN IS ACCEPTABLE. RESPIRATORY CULTURE REPORT TO FOLLOW.     Performed at [facility]   Report Status 05/09/2012 FINAL   Final   CULTURE, RESPIRATORY     Status: Normal (Preliminary result)   Collection Time   05/09/12  8:11 AM      Component Value Range Status Comment   Specimen Description SPUTUM EXPECTORATED   Final    Special Requests NONE   Final    Gram Stain     Final    Value: FEW WBC PRESENT,BOTH PMN AND MONONUCLEAR     MODERATE SQUAMOUS EPITHELIAL CELLS PRESENT     RARE GRAM POSITIVE COCCI     IN CLUSTERS RARE GRAM NEGATIVE RODS   Culture PENDING   Incomplete    Report Status PENDING   Incomplete      Labs: Basic Metabolic Panel:  Lab 05/10/12 1610 05/09/12 0444 05/08/12 0552 05/07/12 1605  NA 139 140 135 132*  K 4.2 4.6 3.3* 3.3*  CL 103 103 99 96  CO2 28 28 29 26   GLUCOSE 201* 128* 127* 120*  BUN 7 9 6 6   CREATININE 0.56 0.60 0.61 0.62  CALCIUM 9.2 9.2 8.4 8.8  MG -- -- -- --  PHOS -- -- -- --   Liver Function Tests:  Lab 05/10/12 0423 05/09/12 0444 05/08/12 0552 05/07/12 1605  AST 28 75* 267* 14  ALT 61* 99* 154* 10  ALKPHOS 66 92 85 47  BILITOT 0.2* 0.2* 0.9 0.3  PROT 6.8 6.6 6.7 7.2  ALBUMIN 3.1* 3.2* 3.2* 3.6     CBC:  Lab 05/10/12 0423 05/08/12 0552 05/07/12 1605  WBC 13.2* 4.0 7.0  NEUTROABS -- -- 5.5  HGB 10.1* 10.1* 11.0*  HCT 33.9* 34.2* 36.4  MCV 75.0* 75.8* 74.0*  PLT 232 195 209     BNP: BNP (last 3 results)  Basename 02/18/12 0601  PROBNP 485.0*   CBG:  Lab 05/10/12 0735 05/09/12 2111 05/09/12 1648 05/09/12 1151 05/09/12 0732  GLUCAP 161* 229* 200* 108* 124*       Signed:  GOSRANI,NIMISH C  [facility] 05/10/2012, 8:48 AM

## 2012-05-11 ENCOUNTER — Emergency Department (HOSPITAL_COMMUNITY)
Admission: EM | Admit: 2012-05-11 | Discharge: 2012-05-11 | Disposition: A | Discharge status: Home / Self Care | Payer: Medicaid Other | Attending: Emergency Medicine | Admitting: Emergency Medicine

## 2012-05-11 ENCOUNTER — Encounter (HOSPITAL_COMMUNITY): Payer: Self-pay | Admitting: *Deleted

## 2012-05-11 ENCOUNTER — Emergency Department (HOSPITAL_COMMUNITY): Payer: Medicaid Other

## 2012-05-11 DIAGNOSIS — M549 Dorsalgia, unspecified: Secondary | ICD-10-CM | POA: Insufficient documentation

## 2012-05-11 DIAGNOSIS — R059 Cough, unspecified: Secondary | ICD-10-CM | POA: Insufficient documentation

## 2012-05-11 DIAGNOSIS — J9610 Chronic respiratory failure, unspecified whether with hypoxia or hypercapnia: Secondary | ICD-10-CM | POA: Insufficient documentation

## 2012-05-11 DIAGNOSIS — F17200 Nicotine dependence, unspecified, uncomplicated: Secondary | ICD-10-CM | POA: Insufficient documentation

## 2012-05-11 DIAGNOSIS — F329 Major depressive disorder, single episode, unspecified: Secondary | ICD-10-CM | POA: Insufficient documentation

## 2012-05-11 DIAGNOSIS — R05 Cough: Secondary | ICD-10-CM | POA: Insufficient documentation

## 2012-05-11 DIAGNOSIS — E876 Hypokalemia: Secondary | ICD-10-CM

## 2012-05-11 DIAGNOSIS — D649 Anemia, unspecified: Secondary | ICD-10-CM | POA: Insufficient documentation

## 2012-05-11 DIAGNOSIS — R0789 Other chest pain: Secondary | ICD-10-CM | POA: Insufficient documentation

## 2012-05-11 DIAGNOSIS — J111 Influenza due to unidentified influenza virus with other respiratory manifestations: Secondary | ICD-10-CM

## 2012-05-11 DIAGNOSIS — Z8719 Personal history of other diseases of the digestive system: Secondary | ICD-10-CM | POA: Insufficient documentation

## 2012-05-11 DIAGNOSIS — J449 Chronic obstructive pulmonary disease, unspecified: Secondary | ICD-10-CM

## 2012-05-11 DIAGNOSIS — J189 Pneumonia, unspecified organism: Secondary | ICD-10-CM | POA: Insufficient documentation

## 2012-05-11 DIAGNOSIS — J4489 Other specified chronic obstructive pulmonary disease: Secondary | ICD-10-CM | POA: Insufficient documentation

## 2012-05-11 DIAGNOSIS — Z79899 Other long term (current) drug therapy: Secondary | ICD-10-CM | POA: Insufficient documentation

## 2012-05-11 DIAGNOSIS — E119 Type 2 diabetes mellitus without complications: Secondary | ICD-10-CM | POA: Insufficient documentation

## 2012-05-11 DIAGNOSIS — J45909 Unspecified asthma, uncomplicated: Secondary | ICD-10-CM | POA: Insufficient documentation

## 2012-05-11 DIAGNOSIS — F411 Generalized anxiety disorder: Secondary | ICD-10-CM | POA: Insufficient documentation

## 2012-05-11 DIAGNOSIS — K219 Gastro-esophageal reflux disease without esophagitis: Secondary | ICD-10-CM | POA: Insufficient documentation

## 2012-05-11 DIAGNOSIS — Z8679 Personal history of other diseases of the circulatory system: Secondary | ICD-10-CM | POA: Insufficient documentation

## 2012-05-11 DIAGNOSIS — F3289 Other specified depressive episodes: Secondary | ICD-10-CM | POA: Insufficient documentation

## 2012-05-11 LAB — CULTURE, RESPIRATORY

## 2012-05-11 LAB — CBC WITH DIFFERENTIAL/PLATELET
Basophils Absolute: 0 10*3/uL (ref 0.0–0.1)
Basophils Relative: 0 % (ref 0–1)
Eosinophils Absolute: 0 10*3/uL (ref 0.0–0.7)
Eosinophils Relative: 0 % (ref 0–5)
HCT: 32.8 % — ABNORMAL LOW (ref 36.0–46.0)
Hemoglobin: 10 g/dL — ABNORMAL LOW (ref 12.0–15.0)
Lymphocytes Relative: 30 % (ref 12–46)
Lymphs Abs: 3.7 10*3/uL (ref 0.7–4.0)
MCH: 22.5 pg — ABNORMAL LOW (ref 26.0–34.0)
MCHC: 30.5 g/dL (ref 30.0–36.0)
MCV: 73.7 fL — ABNORMAL LOW (ref 78.0–100.0)
Monocytes Absolute: 0.7 10*3/uL (ref 0.1–1.0)
Monocytes Relative: 6 % (ref 3–12)
Neutro Abs: 7.8 10*3/uL — ABNORMAL HIGH (ref 1.7–7.7)
Neutrophils Relative %: 64 % (ref 43–77)
Platelets: 225 10*3/uL (ref 150–400)
RBC: 4.45 MIL/uL (ref 3.87–5.11)
RDW: 18.2 % — ABNORMAL HIGH (ref 11.5–15.5)
WBC: 12.3 10*3/uL — ABNORMAL HIGH (ref 4.0–10.5)

## 2012-05-11 LAB — BASIC METABOLIC PANEL
BUN: 8 mg/dL (ref 6–23)
CO2: 30 mEq/L (ref 19–32)
Calcium: 8.9 mg/dL (ref 8.4–10.5)
Chloride: 99 mEq/L (ref 96–112)
Creatinine, Ser: 0.55 mg/dL (ref 0.50–1.10)
GFR calc Af Amer: >90 mL/min (ref >90)
GFR calc non Af Amer: >90 mL/min (ref >90)
Glucose, Bld: 100 mg/dL — ABNORMAL HIGH (ref 70–99)
Potassium: 2.9 mEq/L — ABNORMAL LOW (ref 3.5–5.1)
Sodium: 138 mEq/L (ref 135–145)

## 2012-05-11 LAB — CULTURE, RESPIRATORY W GRAM STAIN: Culture: NORMAL

## 2012-05-11 MED ORDER — OXYCODONE-ACETAMINOPHEN 5-325 MG PO TABS
1.0000 | ORAL_TABLET | Freq: Once | ORAL | Status: AC
  Administered 2012-05-11: 1 via ORAL
  Filled 2012-05-11: qty 1

## 2012-05-11 MED ORDER — ALBUTEROL SULFATE (5 MG/ML) 0.5% IN NEBU
5.0000 mg | INHALATION_SOLUTION | Freq: Once | RESPIRATORY_TRACT | Status: AC
  Administered 2012-05-11: 5 mg via RESPIRATORY_TRACT

## 2012-05-11 MED ORDER — LEVOFLOXACIN 500 MG PO TABS
500.0000 mg | ORAL_TABLET | Freq: Once | ORAL | Status: AC
  Administered 2012-05-11: 500 mg via ORAL
  Filled 2012-05-11: qty 1

## 2012-05-11 MED ORDER — PREDNISONE 50 MG PO TABS
60.0000 mg | ORAL_TABLET | Freq: Once | ORAL | Status: AC
  Administered 2012-05-11: 60 mg via ORAL
  Filled 2012-05-11: qty 1

## 2012-05-11 MED ORDER — POTASSIUM CHLORIDE CRYS ER 20 MEQ PO TBCR
40.0000 meq | EXTENDED_RELEASE_TABLET | Freq: Once | ORAL | Status: AC
  Administered 2012-05-11: 40 meq via ORAL
  Filled 2012-05-11: qty 2

## 2012-05-11 MED ORDER — OSELTAMIVIR PHOSPHATE 75 MG PO CAPS
75.0000 mg | ORAL_CAPSULE | Freq: Once | ORAL | Status: AC
  Administered 2012-05-11: 75 mg via ORAL
  Filled 2012-05-11: qty 1

## 2012-05-11 MED ORDER — ALBUTEROL (5 MG/ML) CONTINUOUS INHALATION SOLN
15.0000 mg | INHALATION_SOLUTION | Freq: Once | RESPIRATORY_TRACT | Status: AC
  Administered 2012-05-11: 15 mg via RESPIRATORY_TRACT
  Filled 2012-05-11: qty 20

## 2012-05-11 MED ORDER — IPRATROPIUM BROMIDE 0.02 % IN SOLN
0.5000 mg | Freq: Once | RESPIRATORY_TRACT | Status: AC
  Administered 2012-05-11: 0.5 mg via RESPIRATORY_TRACT
  Filled 2012-05-11: qty 2.5

## 2012-05-11 NOTE — ED Notes (Signed)
Patient was sent home yesterday with flu and pnuemonia

## 2012-05-11 NOTE — ED Notes (Addendum)
Pt ambulated in hallway. Pt maintained 90-93 o2 sat with ambulation on 2L. No nad noted while ambulating. MD aware. No new orders given at this time. Pt back in room resting comfortably. Pt reports "It is getting easier to breathe".

## 2012-05-11 NOTE — ED Notes (Signed)
Patient had tamiflu filled but not prednisone

## 2012-05-11 NOTE — ED Provider Notes (Signed)
History   This chart was scribed for Sheila Gaskins, MD by Toya Smothers, ED Scribe. The patient was seen in room APA14/APA14. Patient's care was started at 0653.  CSN: [id number]  Arrival date & time 05/11/12  4098   First MD Initiated Contact with Patient 05/11/12 [phone]      Chief Complaint  Patient presents with  . Shortness of Breath    Patient is a 47 y.o. female presenting with shortness of breath. The history is provided by the patient. No language interpreter was used.  Shortness of Breath  The current episode started today. The onset was gradual. The problem occurs continuously. The problem has been unchanged. The problem is severe. Nothing relieves the symptoms. Nothing aggravates the symptoms. Associated symptoms include chest pain, cough and shortness of breath. The cough is non-productive and dry. There is no color change associated with the cough. Nothing relieves the cough. Context: at rest. She has had intermittent steroid use. She has had prior hospitalizations. Her past medical history is significant for asthma and asthma in the family. Recently, medical care has been given at this facility.    [name] [name] is a 47 y.o. female with a h/o COPD, Asthma, DM, and chronic respiratory failure, who brought by EMS presents to the ED complaining of 5 hours of recurrent, gradual onset, unchanged, severe SOB with chest tightness and non-productive cough. Pain is constant and moderate. No alleviators or aggravators. Pt was admitted 4 days ago and discharged yesterday with Dx of Flu and Pneumonia. This morning Pt awoke with progressive SOB. One hour after onset, Pt reports lower back spasms. Despite the use of albuterol treatment and increase of [place] oxygen levels, there was no relief to SOB. No fever, leg swelling, nausea, vomiting, or diarrhea. Pt is a current everyday smoker admitting social alcohol use. Current medications include Tamiflu and Levaquin. Pt has not taken medications  today.     Past Medical History  Diagnosis Date  . Diabetes mellitus   . COPD (chronic obstructive pulmonary disease)   . HTN (hypertension)   . Low back pain   . Tachycardia     never had test done since no insurance  . Depression   . Asthma   . Shortness of breath   . Anxiety   . Gastric erosions     EGD 08/2010.  . Internal hemorrhoids     Colonoscopy 5/12.  [name] Heavy menses   . Chronic respiratory failure with hypoxia     On 2-3 L of oxygen at home  . GERD (gastroesophageal reflux disease)   . Anemia     Past Surgical History  Procedure Date  . Cholecystectomy 1990  . Cesarean section     twice  . Kidney surgery     as child for blockages  . Tubal ligation   . Tonsillectomy   . Wrist surgery 1995    Lt wrist  . Tympanostomy tube placement   . Hysteroscopy with thermachoice 01/17/2012    Procedure: HYSTEROSCOPY WITH THERMACHOICE;  Surgeon: Lazaro Arms, MD;  Location: [facility];  Service: Gynecology;  Laterality: N/A;  total therapy time: 9:13sec  D5W  18 ml in, D5W   18ml out, temperture 87degrees celcious  . Uterine ablation     Family History  Problem Relation Age of Onset  . Heart attack Father 7    deceased, etoh use  . Heart disease Father   . Heart attack Mother 62    deceased  .  Diabetes Mother   . Breast cancer Mother   . Heart failure Mother     oxygen dependence, nonsmoker  . Heart disease Mother   . Depression Mother   . Cancer Mother   . Colon cancer Neg Hx   . Liver disease Maternal Aunt 40    died while on liver transplant list  . Heart attack Maternal Grandmother     premature CAD  . Ulcers Sister   . Hypertension Sister     History  Substance Use Topics  . Smoking status: Current Every Day Smoker -- 0.5 packs/day for 30 years    Types: Cigarettes  . Smokeless tobacco: Never Used  . Alcohol Use: Yes     Comment: social use   Review of Systems  Respiratory: Positive for cough, chest tightness and shortness of breath.     Cardiovascular: Positive for chest pain.  Musculoskeletal: Positive for back pain.  All other systems reviewed and are negative.    Allergies  Codeine and Wellbutrin  Home Medications   Current Outpatient Rx  Name  Route  Sig  Dispense  Refill  . ALBUTEROL SULFATE HFA 108 (90 BASE) MCG/ACT IN AERS   Inhalation   Inhale 2 puffs into the lungs every 4 (four) hours as needed for wheezing or shortness of breath. Wheezing/shortnes of breath   1 Inhaler   3   . ALBUTEROL SULFATE (5 MG/ML) 0.5% IN NEBU   Nebulization   Take 2.5 mg by nebulization 4 (four) times daily as needed. For shortness of breath         . ALPRAZOLAM 0.5 MG PO TABS   Oral   Take 1 tablet (0.5 mg total) by mouth 2 (two) times daily as needed for anxiety (And for tremulousness assoc with meds.).   45 tablet   2   . BENZONATATE 200 MG PO CAPS   Oral   Take 1 capsule (200 mg total) by mouth 3 (three) times daily as needed for cough.   20 capsule   0   . CITALOPRAM HYDROBROMIDE 40 MG PO TABS   Oral   Take 1 tablet (40 mg total) by mouth every morning.   30 tablet   6   . FERROUS SULFATE 325 (65 FE) MG PO TABS   Oral   Take 325 mg by mouth daily with breakfast.         . FLUTICASONE PROPIONATE 50 MCG/ACT NA SUSP   Nasal   Place 2 sprays into the nose daily.   16 g   3   . GABAPENTIN 300 MG PO CAPS   Oral   Take 1 capsule (300 mg total) by mouth 3 (three) times daily.   90 capsule   2   . GUAIFENESIN ER 600 MG PO TB12   Oral   Take 2 tablets (1,200 mg total) by mouth 2 (two) times daily.   30 tablet   0   . IBUPROFEN 200 MG PO TABS   Oral   Take 200-400 mg by mouth daily as needed. For pain          . LEVOFLOXACIN 500 MG PO TABS   Oral   Take 1 tablet (500 mg total) by mouth daily.   7 tablet   0   . METFORMIN HCL 500 MG PO TABS   Oral   Take 500 mg by mouth 2 (two) times daily with a meal.         . MOMETASONE FURO-FORMOTEROL  FUM 100-5 MCG/ACT IN AERO   Inhalation    Inhale 2 puffs into the lungs 2 (two) times daily as needed. For shortness of breath         . ADULT MULTIVITAMIN W/MINERALS CH   Oral   Take 1 tablet by mouth daily.         [name] NICOTINE 21 MG/24HR TD PT24   Transdermal   Place 1 patch onto the skin daily.   28 patch   0   . OMEPRAZOLE 20 MG PO CPDR   Oral   Take 20 mg by mouth every morning.         . OXYCODONE-ACETAMINOPHEN 5-325 MG PO TABS   Oral   Take 1 tablet by mouth 3 (three) times daily as needed for pain.   90 tablet   0   . VALSARTAN 80 MG PO TABS   Oral   Take 40 mg by mouth daily.         . OSELTAMIVIR PHOSPHATE 75 MG PO CAPS   Oral   Take 1 capsule (75 mg total) by mouth 2 (two) times daily.   6 capsule   0   . PREDNISONE 20 MG PO TABS      Take 2 tablets daily for 3 days, then 1 tablet daily for 3 days, then half tablet daily for 3 days, then STOP.   12 tablet   0     BP 143/67  Pulse 106  Temp 98.1 F (36.7 C) (Oral)  Resp 28  SpO2 90% BP 143/45  Pulse 104  Temp 98.1 F (36.7 C) (Oral)  Resp 22  SpO2 97%  Physical Exam CONSTITUTIONAL: Well developed/well nourished HEAD AND FACE: Normocephalic/atraumatic EYES: EOMI/PERRL ENMT: Mucous membranes moist, uvula midline NECK: supple no meningeal signs SPINE:entire spine nontender CV: S1/S2 noted, no murmurs/rubs/gallops noted LUNGS:Coarse wheezing bilaterally.  She speaks to me comfortably ABDOMEN: soft, nontender, no rebound or guarding GU:no cva tenderness NEURO: Pt is awake/alert, moves all extremitiesx4 EXTREMITIES: pulses normal, full ROM SKIN: warm, color normal PSYCH: no abnormalities of mood noted    ED Course  Procedures DIAGNOSTIC STUDIES: Oxygen Saturation is 90% on [place], low by my interpretation.    COORDINATION OF CARE: 07:10- Evaluated Pt. Pt is awake, alert, and without distress. 07:19- Ordered ED EKG and DG Chest Portable 1 View 1 time imaging. 07:20- Patient understand and agree with initial ED impression  and plan with expectations set for ED visit. 07:30- Ordered albuterol (PROVENTIL,VENTOLIN) solution continuous neb, levofloxacin (LEVAQUIN) tablet 500 mg, predniSONE (DELTASONE) tablet 60 mg, oseltamivir (TAMIFLU) capsule 75 mg , and ipratropium (ATROVENT) nebulizer solution 0.5 mg Once.  10:55 AM Pt with recent admission for pneumonia and also found to be +flu.  She is in no distress currently but reports SOB at home.  Will start meds that were given at hospital discharge and then reassess 10:55 AM Pt feels improved. No distress noted.  Still has some residual wheeze but improved 11:54 AM Pt reports she feels improved.  She is in no distress, no tachypnea, well appearing.  No hypoxia noted.  She just started her antibiotics and I feel she is stable for outpatient management.  She has f/u arranged with PCP This week.  Labs Reviewed  BASIC METABOLIC PANEL - Abnormal; Notable for the following:    Potassium 2.9 (*)  DELTA CHECK NOTED   Glucose, Bld 100 (*)     All other components within normal limits  CBC WITH DIFFERENTIAL -  Abnormal; Notable for the following:    WBC 12.3 (*)     Hemoglobin 10.0 (*)     HCT 32.8 (*)     MCV 73.7 (*)     MCH 22.5 (*)     RDW 18.2 (*)     Neutro Abs 7.8 (*)     All other components within normal limits    Dg Chest Portable 1 View  05/11/2012  *RADIOLOGY REPORT*  Clinical Data: Shortness of breath  PORTABLE CHEST - 1 VIEW  Comparison:  05/07/2012; 02/13/2012; 10/19/2011; chest CT - 02/17/2012  Findings: Grossly unchanged enlarged cardiac silhouette and mediastinal contours with atherosclerotic calcifications within the thoracic aorta.  Worsening bilateral basilar heterogeneous air space opacities.  Mild pulmonary congestion without frank evidence of edema.  No pleural effusion or pneumothorax.  Unchanged bones.  IMPRESSION: Worsening bibasilar heterogeneous opacities suggestive of worsening multifocal infection.  A follow-up chest radiograph in 4 to 6 weeks  after treatment is recommended to ensure resolution.   Original Report Authenticated By: Tacey Ruiz, MD      MDM  Nursing notes including past medical history and social history reviewed and considered in documentation xrays reviewed and considered Labs/vital reviewed and considered Previous records reviewed and considered - pt with recent admission, d/c summary reviewed      Date: 05/11/2012  Rate: 99  Rhythm: normal sinus rhythm  QRS Axis: normal  Intervals: normal  ST/T Wave abnormalities: normal  Conduction Disutrbances:none  Narrative Interpretation:   Old EKG Reviewed: unchanged    I personally performed the services described in this documentation, which was scribed in my presence. The recorded information has been reviewed and is accurate.      Sheila Gaskins, MD 05/11/12 1155

## 2012-05-13 LAB — CULTURE, BLOOD (ROUTINE X 2)
Culture: NO GROWTH
Culture: NO GROWTH

## 2012-05-16 ENCOUNTER — Ambulatory Visit: Payer: Medicaid Other | Admitting: Family Medicine

## 2012-05-20 ENCOUNTER — Ambulatory Visit (INDEPENDENT_AMBULATORY_CARE_PROVIDER_SITE_OTHER): Payer: Medicaid Other | Admitting: Family Medicine

## 2012-05-20 ENCOUNTER — Encounter: Payer: Self-pay | Admitting: Family Medicine

## 2012-05-20 VITALS — BP 140/76 | HR 90 | Resp 18 | Ht 64.0 in | Wt 261.1 lb

## 2012-05-20 DIAGNOSIS — E876 Hypokalemia: Secondary | ICD-10-CM

## 2012-05-20 DIAGNOSIS — F32A Depression, unspecified: Secondary | ICD-10-CM

## 2012-05-20 DIAGNOSIS — J9610 Chronic respiratory failure, unspecified whether with hypoxia or hypercapnia: Secondary | ICD-10-CM

## 2012-05-20 DIAGNOSIS — Z720 Tobacco use: Secondary | ICD-10-CM

## 2012-05-20 DIAGNOSIS — I10 Essential (primary) hypertension: Secondary | ICD-10-CM

## 2012-05-20 DIAGNOSIS — J441 Chronic obstructive pulmonary disease with (acute) exacerbation: Secondary | ICD-10-CM

## 2012-05-20 DIAGNOSIS — H7290 Unspecified perforation of tympanic membrane, unspecified ear: Secondary | ICD-10-CM

## 2012-05-20 DIAGNOSIS — F329 Major depressive disorder, single episode, unspecified: Secondary | ICD-10-CM

## 2012-05-20 DIAGNOSIS — E119 Type 2 diabetes mellitus without complications: Secondary | ICD-10-CM

## 2012-05-20 DIAGNOSIS — F3289 Other specified depressive episodes: Secondary | ICD-10-CM

## 2012-05-20 DIAGNOSIS — F17200 Nicotine dependence, unspecified, uncomplicated: Secondary | ICD-10-CM

## 2012-05-20 MED ORDER — PREDNISONE 10 MG PO TABS: ORAL_TABLET | ORAL | Status: DC

## 2012-05-20 NOTE — Patient Instructions (Addendum)
Referral to ENT for your ear and hearing Get the metabolic panel  Another course of prednisone sent Will send a new nebulizer machine ( [facility]- Michell Heinrich)  Continue current blood pressure  F/U 3 months

## 2012-05-21 LAB — BASIC METABOLIC PANEL
BUN: 13 mg/dL (ref 6–23)
CO2: 29 mEq/L (ref 19–32)
Calcium: 9 mg/dL (ref 8.4–10.5)
Chloride: 101 mEq/L (ref 96–112)
Creat: 0.63 mg/dL (ref 0.50–1.10)
Glucose, Bld: 150 mg/dL — ABNORMAL HIGH (ref 70–99)
Potassium: 4.4 mEq/L (ref 3.5–5.3)
Sodium: 138 mEq/L (ref 135–145)

## 2012-05-22 ENCOUNTER — Encounter: Payer: Self-pay | Admitting: Family Medicine

## 2012-05-22 DIAGNOSIS — H7291 Unspecified perforation of tympanic membrane, right ear: Secondary | ICD-10-CM | POA: Insufficient documentation

## 2012-05-22 DIAGNOSIS — H7290 Unspecified perforation of tympanic membrane, unspecified ear: Secondary | ICD-10-CM | POA: Insufficient documentation

## 2012-05-22 NOTE — Assessment & Plan Note (Signed)
Referral to ENT °

## 2012-05-22 NOTE — Progress Notes (Signed)
  Subjective:    Patient ID: [name], female    DOB: 08-21-1965, 47 y.o.   MRN: [id number]  HPI  Pt here to f/u hospital admission for COPD, pneumonia and flu. She lost her medicaid in intermin now re-established since her admission. She needs neb machine. She tapered off prednisone 2 days ago and feels like she is worsening since she tapered off, she breaths much better on prednisone. Unable to see pulmonary. , she has been wearing oxygen  At bedtime, feels heaviness on and off in chest, cough still present and wheezing  She never heard from [facility] about referral to psychiatry  DM- blood sugars have been good  Ear pain- having more pain from Right ear and feels she is loosing hearing in that ear, no current drainage Review of Systems   GEN- denies fatigue, fever, weight loss,weakness, recent illness HEENT- denies eye drainage, change in vision, nasal discharge, CVS- denies chest pain, palpitations RESP- + SOB, +cough, +wheeze ABD- denies N/V, change in stools, abd pain GU- denies dysuria, hematuria, dribbling, incontinence MSK- + joint pain, muscle aches, injury Neuro- denies headache, dizziness, syncope, seizure activity      Objective:   Physical Exam GEN- NAD, alert and oriented x3, obese  HEENT- PERRL, EOMI, non injected sclera, pink conjunctiva, MMM, oropharynx clear, Right perforated TM- no acute discharge  CVS- RRR, no murmur RESP-increased WOB with cough only,  + wheeze, no rhonchi EXT- trace pedal edema Pulses- Radial, DP- 2+ Psych-normal affect and mood         Assessment & Plan:

## 2012-05-22 NOTE — Assessment & Plan Note (Signed)
BP stable at  Higher doses had dizziness and hypotension, will wait to see how she recovers from illness before changing again

## 2012-05-22 NOTE — Assessment & Plan Note (Signed)
Recheck K 

## 2012-05-22 NOTE — Assessment & Plan Note (Signed)
Continue celexa and xanax 

## 2012-05-22 NOTE — Assessment & Plan Note (Signed)
Well controlled, A1C 6.2

## 2012-05-22 NOTE — Assessment & Plan Note (Signed)
Continue oxygen therapy- 2L bedtime and as needed during the day

## 2012-05-22 NOTE — Assessment & Plan Note (Signed)
Continues to cut back. 

## 2012-05-22 NOTE — Assessment & Plan Note (Signed)
Continue inhalers, new neb machine Will give a few more days of prednisone We will see how she does and get her back to pulmonary as needed

## 2012-05-30 ENCOUNTER — Ambulatory Visit (INDEPENDENT_AMBULATORY_CARE_PROVIDER_SITE_OTHER): Payer: Medicaid Other | Admitting: Otolaryngology

## 2012-05-30 DIAGNOSIS — H908 Mixed conductive and sensorineural hearing loss, unspecified: Secondary | ICD-10-CM

## 2012-05-30 DIAGNOSIS — H66019 Acute suppurative otitis media with spontaneous rupture of ear drum, unspecified ear: Secondary | ICD-10-CM

## 2012-06-10 ENCOUNTER — Other Ambulatory Visit: Payer: Self-pay

## 2012-06-10 MED ORDER — OXYCODONE-ACETAMINOPHEN 5-325 MG PO TABS: 1.0000 | ORAL_TABLET | Freq: Three times a day (TID) | ORAL | Status: DC | PRN

## 2012-06-13 ENCOUNTER — Ambulatory Visit (INDEPENDENT_AMBULATORY_CARE_PROVIDER_SITE_OTHER): Payer: Medicaid Other | Admitting: Otolaryngology

## 2012-06-13 DIAGNOSIS — H7200 Central perforation of tympanic membrane, unspecified ear: Secondary | ICD-10-CM

## 2012-06-13 DIAGNOSIS — H902 Conductive hearing loss, unspecified: Secondary | ICD-10-CM

## 2012-06-21 ENCOUNTER — Ambulatory Visit (INDEPENDENT_AMBULATORY_CARE_PROVIDER_SITE_OTHER): Payer: Medicaid Other | Admitting: Family Medicine

## 2012-06-21 ENCOUNTER — Emergency Department (HOSPITAL_COMMUNITY)
Admission: EM | Admit: 2012-06-21 | Discharge: 2012-06-21 | Disposition: A | Discharge status: Home / Self Care | Payer: Medicaid Other | Attending: Emergency Medicine | Admitting: Emergency Medicine

## 2012-06-21 ENCOUNTER — Encounter: Payer: Self-pay | Admitting: Family Medicine

## 2012-06-21 ENCOUNTER — Emergency Department (HOSPITAL_COMMUNITY): Payer: Medicaid Other

## 2012-06-21 ENCOUNTER — Encounter (HOSPITAL_COMMUNITY): Payer: Self-pay

## 2012-06-21 VITALS — BP 132/74 | HR 103 | Resp 22 | Ht 64.0 in | Wt 264.0 lb

## 2012-06-21 DIAGNOSIS — D649 Anemia, unspecified: Secondary | ICD-10-CM | POA: Insufficient documentation

## 2012-06-21 DIAGNOSIS — F3289 Other specified depressive episodes: Secondary | ICD-10-CM | POA: Insufficient documentation

## 2012-06-21 DIAGNOSIS — F329 Major depressive disorder, single episode, unspecified: Secondary | ICD-10-CM | POA: Insufficient documentation

## 2012-06-21 DIAGNOSIS — Z8709 Personal history of other diseases of the respiratory system: Secondary | ICD-10-CM | POA: Insufficient documentation

## 2012-06-21 DIAGNOSIS — J45909 Unspecified asthma, uncomplicated: Secondary | ICD-10-CM | POA: Insufficient documentation

## 2012-06-21 DIAGNOSIS — F411 Generalized anxiety disorder: Secondary | ICD-10-CM | POA: Insufficient documentation

## 2012-06-21 DIAGNOSIS — J189 Pneumonia, unspecified organism: Secondary | ICD-10-CM | POA: Insufficient documentation

## 2012-06-21 DIAGNOSIS — Z8742 Personal history of other diseases of the female genital tract: Secondary | ICD-10-CM | POA: Insufficient documentation

## 2012-06-21 DIAGNOSIS — Z8679 Personal history of other diseases of the circulatory system: Secondary | ICD-10-CM | POA: Insufficient documentation

## 2012-06-21 DIAGNOSIS — K219 Gastro-esophageal reflux disease without esophagitis: Secondary | ICD-10-CM | POA: Insufficient documentation

## 2012-06-21 DIAGNOSIS — Z79899 Other long term (current) drug therapy: Secondary | ICD-10-CM | POA: Insufficient documentation

## 2012-06-21 DIAGNOSIS — R0602 Shortness of breath: Secondary | ICD-10-CM | POA: Insufficient documentation

## 2012-06-21 DIAGNOSIS — J9620 Acute and chronic respiratory failure, unspecified whether with hypoxia or hypercapnia: Secondary | ICD-10-CM

## 2012-06-21 DIAGNOSIS — J449 Chronic obstructive pulmonary disease, unspecified: Secondary | ICD-10-CM | POA: Insufficient documentation

## 2012-06-21 DIAGNOSIS — IMO0002 Reserved for concepts with insufficient information to code with codable children: Secondary | ICD-10-CM | POA: Insufficient documentation

## 2012-06-21 DIAGNOSIS — J441 Chronic obstructive pulmonary disease with (acute) exacerbation: Secondary | ICD-10-CM

## 2012-06-21 DIAGNOSIS — J4489 Other specified chronic obstructive pulmonary disease: Secondary | ICD-10-CM | POA: Insufficient documentation

## 2012-06-21 DIAGNOSIS — Z8719 Personal history of other diseases of the digestive system: Secondary | ICD-10-CM | POA: Insufficient documentation

## 2012-06-21 DIAGNOSIS — F17200 Nicotine dependence, unspecified, uncomplicated: Secondary | ICD-10-CM | POA: Insufficient documentation

## 2012-06-21 DIAGNOSIS — I10 Essential (primary) hypertension: Secondary | ICD-10-CM | POA: Insufficient documentation

## 2012-06-21 DIAGNOSIS — E119 Type 2 diabetes mellitus without complications: Secondary | ICD-10-CM | POA: Insufficient documentation

## 2012-06-21 MED ORDER — DEXTROSE 5 % IV SOLN
500.0000 mg | Freq: Once | INTRAVENOUS | Status: AC
  Administered 2012-06-21: 500 mg via INTRAVENOUS
  Filled 2012-06-21: qty 500

## 2012-06-21 MED ORDER — IPRATROPIUM BROMIDE 0.02 % IN SOLN
0.5000 mg | Freq: Once | RESPIRATORY_TRACT | Status: AC
  Administered 2012-06-21: 0.5 mg via RESPIRATORY_TRACT

## 2012-06-21 MED ORDER — ALBUTEROL SULFATE (5 MG/ML) 0.5% IN NEBU
5.0000 mg | INHALATION_SOLUTION | Freq: Once | RESPIRATORY_TRACT | Status: AC
  Administered 2012-06-21: 5 mg via RESPIRATORY_TRACT
  Filled 2012-06-21: qty 1

## 2012-06-21 MED ORDER — PREDNISONE 20 MG PO TABS: ORAL_TABLET | ORAL | Status: DC

## 2012-06-21 MED ORDER — SODIUM CHLORIDE 0.9 % IV SOLN
125.0000 mg | Freq: Once | INTRAVENOUS | Status: AC
  Administered 2012-06-21: 130 mg via INTRAMUSCULAR

## 2012-06-21 MED ORDER — IPRATROPIUM BROMIDE 0.02 % IN SOLN
0.5000 mg | Freq: Once | RESPIRATORY_TRACT | Status: AC
  Administered 2012-06-21: 0.5 mg via RESPIRATORY_TRACT
  Filled 2012-06-21: qty 2.5

## 2012-06-21 MED ORDER — ALBUTEROL SULFATE (5 MG/ML) 0.5% IN NEBU
2.5000 mg | INHALATION_SOLUTION | Freq: Once | RESPIRATORY_TRACT | Status: AC
  Administered 2012-06-21: 2.5 mg via RESPIRATORY_TRACT

## 2012-06-21 MED ORDER — DOXYCYCLINE HYCLATE 100 MG PO CAPS: 100.0000 mg | ORAL_CAPSULE | Freq: Two times a day (BID) | ORAL | Status: DC

## 2012-06-21 MED ORDER — OXYCODONE-ACETAMINOPHEN 5-325 MG PO TABS
1.0000 | ORAL_TABLET | Freq: Once | ORAL | Status: AC
  Administered 2012-06-21: 1 via ORAL
  Filled 2012-06-21: qty 1

## 2012-06-21 MED ORDER — DEXTROSE 5 % IV SOLN
1.0000 g | Freq: Once | INTRAVENOUS | Status: AC
  Administered 2012-06-21: 1 g via INTRAVENOUS
  Filled 2012-06-21: qty 10

## 2012-06-21 NOTE — Patient Instructions (Signed)
Take the steroids as prescribed Use the nebulizer every 4 hours, unless sleep Antibiotics prescribed Continue current pain meds Go to ER if you do not improve

## 2012-06-21 NOTE — Progress Notes (Signed)
  Subjective:    Patient ID: [name], female    DOB: Sep 28, 1965, 47 y.o.   MRN: [id number]  HPI  Patient presents with cough shortness of breath and wheezing as well as worsening back pain. She states that her cough started a week ago and was doing well when she was taking her Percocet for pain however she backed off on this because she was supposed to have a surgery for her ear this week. She noticed that she begin more short of breath and was wheezing she's been using her nebulizer taking Robitussin none of this was helping. She continues to smoke. She also notes that she been taking more pain medication and has been running out and is in severe back pain  Review of Systems  GEN- + fatigue, fever, weight loss,weakness, recent illness HEENT- denies eye drainage, change in vision, nasal discharge, CVS- denies chest pain, palpitations RESP- + SOB, +cough, +wheeze ABD- denies N/V, change in stools, abd pain GU- denies dysuria, hematuria, dribbling, incontinence MSK- + joint pain, muscle aches, injury Neuro- denies headache, dizziness, syncope, seizure activity      Objective:   Physical Exam GEN- NAD, alert and oriented x3 HEENT- PERRL, EOMI, non injected sclera, pink conjunctiva, MMM, oropharynx clear Neck- Supple,  CVS- tachycardia, no murmur RESP- bilateral wheeze, harsh cough, mild rhonchi, increased WOB with cough, sat 98% 2L EXT- No edema Pulses- Radial 2+        Assessment & Plan:

## 2012-06-21 NOTE — ED Notes (Signed)
Pt is receiving antibiotic infusion over an hour. To be discharged afterwards.

## 2012-06-21 NOTE — Addendum Note (Signed)
Addended by: Kandis Fantasia B on: 06/21/2012 05:02 PM   Modules accepted: Orders

## 2012-06-21 NOTE — ED Notes (Signed)
Pt has been sick for 5 days, w/ copd, was at pmd today given duoneb w/ no relief. Solumedrol 125mg  im

## 2012-06-21 NOTE — Assessment & Plan Note (Signed)
Due to COPD exacerbation

## 2012-06-21 NOTE — Assessment & Plan Note (Addendum)
Given albuterol Atrovent neb/ solumedrol 125mg  with little change in her symptoms her sats are doing well and her 2 L of oxygen however she is still quite tight and short of breath therefore was sent her to the emergency room as she is high-risk for decline and admission

## 2012-06-21 NOTE — ED Provider Notes (Signed)
History  This chart was scribed for Sheila Hutching, MD by Shari Heritage, ED Scribe. The patient was seen in room APA02/APA02. Patient's care was started at 1452.  CSN: [id number]  Arrival date & time 06/21/12  1451   First MD Initiated Contact with Patient 06/21/12 1452      Chief Complaint  Patient presents with  . COPD     The history is provided by the patient. No language interpreter was used.    HPI Comments: [name] is a 47 y.o. female with history of COPD who was sent here from Dr. Deirdre Peer primary care office with persistent, worsening cough and shortness of breath. Patient states that cough began 4 days ago. She was given Solu-Medrol 125 mg and steroids in Dr. Deirdre Peer office prior to arrival. She has been taking cough syrup and using neb treatments at home with no improvement. She is on oxygen at home that she mostly uses at night. Patient suspects that she has sleep apnea, but she has not been evaluated for his with her PCP .Patient denies fever. Patient is a current every day smoker; she states that she has attempted to quit multiple times. Other medical history includes diabetes, hypertension and GERD.   Past Medical History  Diagnosis Date  . Diabetes mellitus   . COPD (chronic obstructive pulmonary disease)   . HTN (hypertension)   . Low back pain   . Tachycardia     never had test done since no insurance  . Depression   . Asthma   . Shortness of breath   . Anxiety   . Gastric erosions     EGD 08/2010.  . Internal hemorrhoids     Colonoscopy 5/12.  [name] Heavy menses   . Chronic respiratory failure with hypoxia     On 2-3 L of oxygen at home  . GERD (gastroesophageal reflux disease)   . Anemia     Past Surgical History  Procedure Laterality Date  . Cholecystectomy  1990  . Cesarean section      twice  . Kidney surgery      as child for blockages  . Tubal ligation    . Tonsillectomy    . Wrist surgery  1995    Lt wrist  . Tympanostomy tube  placement    . Hysteroscopy with thermachoice  01/17/2012    Procedure: HYSTEROSCOPY WITH THERMACHOICE;  Surgeon: Lazaro Arms, MD;  Location: [facility];  Service: Gynecology;  Laterality: N/A;  total therapy time: 9:13sec  D5W  18 ml in, D5W   18ml out, temperture 87degrees celcious  . Uterine ablation      Family History  Problem Relation Age of Onset  . Heart attack Father 40    deceased, etoh use  . Heart disease Father   . Heart attack Mother 24    deceased  . Diabetes Mother   . Breast cancer Mother   . Heart failure Mother     oxygen dependence, nonsmoker  . Heart disease Mother   . Depression Mother   . Cancer Mother   . Colon cancer Neg Hx   . Liver disease Maternal Aunt 40    died while on liver transplant list  . Heart attack Maternal Grandmother     premature CAD  . Ulcers Sister   . Hypertension Sister     History  Substance Use Topics  . Smoking status: Current Every Day Smoker -- 0.50 packs/day for 30 years    Types:  Cigarettes  . Smokeless tobacco: Never Used  . Alcohol Use: Yes     Comment: social use    OB History   Grav Para Term Preterm Abortions TAB SAB Ect Mult Living                  Review of Systems A complete 10 system review of systems was obtained and all systems are negative except as noted in the HPI and PMH.   Allergies  Codeine and Wellbutrin  Home Medications   Current Outpatient Rx  Name  Route  Sig  Dispense  Refill  . albuterol (PROVENTIL HFA) 108 (90 BASE) MCG/ACT inhaler   Inhalation   Inhale 2 puffs into the lungs every 4 (four) hours as needed for wheezing or shortness of breath. Wheezing/shortnes of breath   1 Inhaler   3   . albuterol (PROVENTIL) (5 MG/ML) 0.5% nebulizer solution   Nebulization   Take 2.5 mg by nebulization 4 (four) times daily as needed. For shortness of breath         . ALPRAZolam (XANAX) 0.5 MG tablet   Oral   Take 1 tablet (0.5 mg total) by mouth 2 (two) times daily as needed for anxiety  (And for tremulousness assoc with meds.).   45 tablet   2   . citalopram (CELEXA) 40 MG tablet   Oral   Take 1 tablet (40 mg total) by mouth every morning.   30 tablet   6   . ferrous sulfate (IRON SUPPLEMENT) 325 (65 FE) MG tablet   Oral   Take 325 mg by mouth daily with breakfast.         . fluticasone (FLONASE) 50 MCG/ACT nasal spray   Nasal   Place 2 sprays into the nose daily.   16 g   3   . gabapentin (NEURONTIN) 300 MG capsule   Oral   Take 1 capsule (300 mg total) by mouth 3 (three) times daily.   90 capsule   2   . guaiFENesin (MUCINEX) 600 MG 12 hr tablet   Oral   Take 2 tablets (1,200 mg total) by mouth 2 (two) times daily.   30 tablet   0   . ibuprofen (ADVIL,MOTRIN) 200 MG tablet   Oral   Take 200-400 mg by mouth daily as needed. For pain          . metFORMIN (GLUCOPHAGE) 500 MG tablet   Oral   Take 500 mg by mouth 2 (two) times daily with a meal.         . mometasone-formoterol (DULERA) 100-5 MCG/ACT AERO   Inhalation   Inhale 2 puffs into the lungs 2 (two) times daily as needed. For shortness of breath         . Multiple Vitamin (MULTIVITAMIN WITH MINERALS) TABS   Oral   Take 1 tablet by mouth daily.         [name] omeprazole (PRILOSEC) 20 MG capsule   Oral   Take 20 mg by mouth every morning.         [name] oxyCODONE-acetaminophen (PERCOCET/ROXICET) 5-325 MG per tablet   Oral   Take 1 tablet by mouth 3 (three) times daily as needed for pain.   90 tablet   0   . valsartan (DIOVAN) 80 MG tablet   Oral   Take 40 mg by mouth daily.           Triage Vitals: BP 130/58  Pulse 95  Temp(Src)  98.2 F (36.8 C) (Oral)  Resp 17  SpO2 97%  Physical Exam  Nursing note and vitals reviewed. Constitutional: She is oriented to person, place, and time. She appears well-developed and well-nourished.  HENT:  Head: Normocephalic and atraumatic.  Eyes: Conjunctivae and EOM are normal. Pupils are equal, round, and reactive to light.  Neck:  Normal range of motion. Neck supple.  Cardiovascular: Normal rate, regular rhythm and normal heart sounds.   Pulmonary/Chest: Effort normal. She has wheezes (slight expiratory).  Abdominal: Soft. Bowel sounds are normal.  Musculoskeletal: Normal range of motion.  Neurological: She is alert and oriented to person, place, and time.  Skin: Skin is warm and dry.  Psychiatric: She has a normal mood and affect.    ED Course  Procedures (including critical care time) DIAGNOSTIC STUDIES: Oxygen Saturation is 97% on room air, adequate by my interpretation.    COORDINATION OF CARE: 2:53 PM- Patient informed of current plan for treatment and evaluation and agrees with plan at this time.    Dg Chest 2 View  06/21/2012  *RADIOLOGY REPORT*  Clinical Data: Shortness of breath, wheezing, cough  CHEST - 2 VIEW  Comparison: 05/11/2012  Findings: Mild linear/patchy lingular opacity, possibly reflecting post infectious/inflammatory scarring, although mild residual pneumonia is not excluded.  No pleural effusion or pneumothorax.  Cardiomediastinal silhouette is within normal limits.  Mild degenerative changes of the visualized thoracolumbar spine.  IMPRESSION: Mild linear/patchy lingular opacity, possibly reflecting post infectious/inflammatory scarring, although mild residual pneumonia is not excluded.   Original Report Authenticated By: Charline Bills, M.D.     Labs Reviewed - No data to display No results found.   No diagnosis found.    MDM  Chest x-ray shows mild linear lingular opacity.  IV Rocephin and Zithromax given in ED.  Discharge meds include prednisone and doxycycline.  We'll avoid Zithromax as discharge med secondary to interaction with Celexa      I personally performed the services described in this documentation, which was scribed in my presence. The recorded information has been reviewed and is accurate.    Sheila Hutching, MD 06/21/12 1726

## 2012-06-21 NOTE — ED Notes (Signed)
Meal tray ordered 

## 2012-06-25 ENCOUNTER — Ambulatory Visit (HOSPITAL_BASED_OUTPATIENT_CLINIC_OR_DEPARTMENT_OTHER): Admission: RE | Admit: 2012-06-25 | Payer: Medicaid Other | Source: Ambulatory Visit | Admitting: Otolaryngology

## 2012-06-25 ENCOUNTER — Encounter (HOSPITAL_BASED_OUTPATIENT_CLINIC_OR_DEPARTMENT_OTHER): Admission: RE | Payer: Self-pay | Source: Ambulatory Visit

## 2012-06-25 SURGERY — TYMPANOPLASTY
Anesthesia: General | Laterality: Right

## 2012-07-08 ENCOUNTER — Other Ambulatory Visit: Payer: Self-pay

## 2012-07-08 MED ORDER — GABAPENTIN 300 MG PO CAPS: 300.0000 mg | ORAL_CAPSULE | Freq: Three times a day (TID) | ORAL | Status: DC

## 2012-07-08 MED ORDER — OXYCODONE-ACETAMINOPHEN 5-325 MG PO TABS: 1.0000 | ORAL_TABLET | Freq: Three times a day (TID) | ORAL | Status: DC | PRN

## 2012-07-15 ENCOUNTER — Other Ambulatory Visit: Payer: Self-pay | Admitting: Family Medicine

## 2012-08-07 ENCOUNTER — Telehealth: Payer: Self-pay | Admitting: Family Medicine

## 2012-08-07 MED ORDER — OXYCODONE-ACETAMINOPHEN 5-325 MG PO TABS: 1.0000 | ORAL_TABLET | Freq: Three times a day (TID) | ORAL | Status: DC | PRN

## 2012-08-07 NOTE — Telephone Encounter (Signed)
Printed. Will be ready on Thursday when Dr is back to sign RX

## 2012-08-14 ENCOUNTER — Other Ambulatory Visit: Payer: Self-pay | Admitting: Family Medicine

## 2012-08-22 ENCOUNTER — Telehealth: Payer: Self-pay | Admitting: Family Medicine

## 2012-08-22 ENCOUNTER — Ambulatory Visit (INDEPENDENT_AMBULATORY_CARE_PROVIDER_SITE_OTHER): Payer: Medicaid Other | Admitting: Family Medicine

## 2012-08-22 ENCOUNTER — Encounter: Payer: Self-pay | Admitting: Family Medicine

## 2012-08-22 VITALS — BP 120/62 | HR 112 | Resp 18 | Wt 262.4 lb

## 2012-08-22 DIAGNOSIS — F17200 Nicotine dependence, unspecified, uncomplicated: Secondary | ICD-10-CM

## 2012-08-22 DIAGNOSIS — F329 Major depressive disorder, single episode, unspecified: Secondary | ICD-10-CM

## 2012-08-22 DIAGNOSIS — R079 Chest pain, unspecified: Secondary | ICD-10-CM

## 2012-08-22 DIAGNOSIS — F3289 Other specified depressive episodes: Secondary | ICD-10-CM

## 2012-08-22 DIAGNOSIS — Z720 Tobacco use: Secondary | ICD-10-CM

## 2012-08-22 DIAGNOSIS — F32A Depression, unspecified: Secondary | ICD-10-CM

## 2012-08-22 DIAGNOSIS — R0789 Other chest pain: Secondary | ICD-10-CM | POA: Insufficient documentation

## 2012-08-22 DIAGNOSIS — M549 Dorsalgia, unspecified: Secondary | ICD-10-CM

## 2012-08-22 DIAGNOSIS — J441 Chronic obstructive pulmonary disease with (acute) exacerbation: Secondary | ICD-10-CM

## 2012-08-22 DIAGNOSIS — E119 Type 2 diabetes mellitus without complications: Secondary | ICD-10-CM

## 2012-08-22 LAB — BASIC METABOLIC PANEL
BUN: 11 mg/dL (ref 6–23)
CO2: 28 mEq/L (ref 19–32)
Calcium: 9.8 mg/dL (ref 8.4–10.5)
Chloride: 102 mEq/L (ref 96–112)
Creat: 0.78 mg/dL (ref 0.50–1.10)
Glucose, Bld: 139 mg/dL — ABNORMAL HIGH (ref 70–99)
Potassium: 3.9 mEq/L (ref 3.5–5.3)
Sodium: 143 mEq/L (ref 135–145)

## 2012-08-22 MED ORDER — OXYCODONE-ACETAMINOPHEN 7.5-325 MG PO TABS: 1.0000 | ORAL_TABLET | Freq: Three times a day (TID) | ORAL | Status: DC | PRN

## 2012-08-22 MED ORDER — LEVOFLOXACIN 500 MG PO TABS: 500.0000 mg | ORAL_TABLET | Freq: Every day | ORAL | Status: AC

## 2012-08-22 MED ORDER — LORATADINE 10 MG PO TABS: 10.0000 mg | ORAL_TABLET | Freq: Every day | ORAL | Status: DC

## 2012-08-22 MED ORDER — PREDNISONE 10 MG PO TABS: ORAL_TABLET | ORAL | Status: DC

## 2012-08-22 NOTE — Patient Instructions (Signed)
Start antibiotics as directed Prednisone sent  Take the klonopin at bedtime  Continue inhalers Get labs done today  Increase neurontin to 600mg  at bedtime and 300mg  morning and afternoon Call the psychiatrist about your medications Pain medication increased to 7.5mg  Percocet F/U 2 weeks for follow-up

## 2012-08-22 NOTE — Telephone Encounter (Signed)
Patient decided to kepp appt and is in with Dr now

## 2012-08-22 NOTE — Assessment & Plan Note (Signed)
Start antibiotics, continue nebs, oral prednisone taper given, she can start if she worsens

## 2012-08-22 NOTE — Assessment & Plan Note (Signed)
I think this is due to her COPD and chest wall pain from coughing EKG reassuring

## 2012-08-22 NOTE — Assessment & Plan Note (Signed)
Continues to smoke, has been smoking > 1 ppd now due to stress  Discussed importance of cessation

## 2012-08-22 NOTE — Progress Notes (Signed)
  Subjective:    Patient ID: [name], female    DOB: [birth year], 47 y.o.   MRN: [id number]  HPI  Patient presents with cough with congestion shortness of breath and wheezing for the past week. Also had chest pain on and off this feels more like chest tightness her cough nonradiating no nausea vomiting associated. She's been using her pain medications to help with her cough in her chest pain was this doesn't work however is not helping her back pain is much and she runs out early because she is using too much. She's been very fatigued and sleeping more she states she was 36 hours straight because she feels too tired to get up. She did see her psychiatrist recently changed her to Klonopin and continue Celexa 40 mg. She thinks the klonopin makes her too tired to get up  Review of Systems   GEN- + fatigue, fever, weight loss,weakness, recent illness HEENT- denies eye drainage, change in vision, nasal discharge, CVS- denies chest pain, palpitations RESP- +SOB,+ cough, +wheeze ABD- denies N/V, change in stools, abd pain GU- denies dysuria, hematuria, dribbling, incontinence MSK- + joint pain, muscle aches, injury Neuro- denies headache, dizziness, syncope, seizure activity      Objective:   Physical Exam GEN- NAD, alert and oriented x3- Room Air , fatigued appearing HEENT- PERRL, EOMI, non injected sclera, pink conjunctiva, MMM, oropharynx clear Neck- Supple,  CVS- tachycardia, no murmur RESP- bilateral congestion/rhonchi, no wheeze, normal WOB, harsh cough EXT- No edema Pulses- Radial 2+ Psych- flat affect, very fatigued appearing, no SI, not anxious appearing   EKG- tachycardia, NSR, HR 103, no ST changes     Assessment & Plan:

## 2012-08-22 NOTE — Assessment & Plan Note (Signed)
I will have her call psychiatry she thinks the klonopin is making her too sleepy , advised to take at bedtime only and call there office, increase activity during the day This will also help with pain

## 2012-08-22 NOTE — Assessment & Plan Note (Signed)
Recheck A1C to make sure not contributing to fatigue, weakness

## 2012-08-22 NOTE — Assessment & Plan Note (Addendum)
Uncontrolled, increase to percocet 7.5mg  TID, if this does not help referral to pain clinic, discussed with pt Gabapentin increased to 600mg  at bedtime to help with back and neuropathy

## 2012-08-23 LAB — CBC WITH DIFFERENTIAL/PLATELET
Basophils Absolute: 0 10*3/uL (ref 0.0–0.1)
Basophils Relative: 0 % (ref 0–1)
Eosinophils Absolute: 0.1 10*3/uL (ref 0.0–0.7)
Eosinophils Relative: 0 % (ref 0–5)
HCT: 36.9 % (ref 36.0–46.0)
Hemoglobin: 11.2 g/dL — ABNORMAL LOW (ref 12.0–15.0)
Lymphocytes Relative: 22 % (ref 12–46)
Lymphs Abs: 3.2 10*3/uL (ref 0.7–4.0)
MCH: 21.5 pg — ABNORMAL LOW (ref 26.0–34.0)
MCHC: 30.4 g/dL (ref 30.0–36.0)
MCV: 71 fL — ABNORMAL LOW (ref 78.0–100.0)
Monocytes Absolute: 1 10*3/uL (ref 0.1–1.0)
Monocytes Relative: 7 % (ref 3–12)
Neutro Abs: 10 10*3/uL — ABNORMAL HIGH (ref 1.7–7.7)
Neutrophils Relative %: 71 % (ref 43–77)
Platelets: 344 10*3/uL (ref 150–400)
RBC: 5.2 MIL/uL — ABNORMAL HIGH (ref 3.87–5.11)
RDW: 18.4 % — ABNORMAL HIGH (ref 11.5–15.5)
WBC: 14.2 10*3/uL — ABNORMAL HIGH (ref 4.0–10.5)

## 2012-08-23 LAB — HEMOGLOBIN A1C
Hgb A1c MFr Bld: 6.2 % — ABNORMAL HIGH (ref <5.7)
Mean Plasma Glucose: 131 mg/dL — ABNORMAL HIGH (ref <117)

## 2012-09-05 ENCOUNTER — Encounter: Payer: Self-pay | Admitting: Family Medicine

## 2012-09-05 ENCOUNTER — Ambulatory Visit (INDEPENDENT_AMBULATORY_CARE_PROVIDER_SITE_OTHER): Payer: Medicaid Other | Admitting: Family Medicine

## 2012-09-05 VITALS — BP 110/62 | HR 96 | Resp 16 | Wt 272.0 lb

## 2012-09-05 DIAGNOSIS — J449 Chronic obstructive pulmonary disease, unspecified: Secondary | ICD-10-CM

## 2012-09-05 DIAGNOSIS — M549 Dorsalgia, unspecified: Secondary | ICD-10-CM

## 2012-09-05 DIAGNOSIS — Z1231 Encounter for screening mammogram for malignant neoplasm of breast: Secondary | ICD-10-CM

## 2012-09-05 DIAGNOSIS — R5381 Other malaise: Secondary | ICD-10-CM

## 2012-09-05 DIAGNOSIS — I10 Essential (primary) hypertension: Secondary | ICD-10-CM

## 2012-09-05 DIAGNOSIS — E119 Type 2 diabetes mellitus without complications: Secondary | ICD-10-CM

## 2012-09-05 DIAGNOSIS — J4489 Other specified chronic obstructive pulmonary disease: Secondary | ICD-10-CM

## 2012-09-05 MED ORDER — OXYCODONE-ACETAMINOPHEN 7.5-325 MG PO TABS: 1.0000 | ORAL_TABLET | Freq: Three times a day (TID) | ORAL | Status: DC | PRN

## 2012-09-05 NOTE — Patient Instructions (Addendum)
Get the thyroid checked Pain script given today for next month  Continue all other medications Gabapentin refilled for 120 tablets Set up your mammogram F/U Sept 2014 [facility]

## 2012-09-06 ENCOUNTER — Encounter: Payer: Self-pay | Admitting: Family Medicine

## 2012-09-06 LAB — CBC
HCT: 36.9 % (ref 36.0–46.0)
Hemoglobin: 11.2 g/dL — ABNORMAL LOW (ref 12.0–15.0)
MCH: 21.6 pg — ABNORMAL LOW (ref 26.0–34.0)
MCHC: 30.4 g/dL (ref 30.0–36.0)
MCV: 71.2 fL — ABNORMAL LOW (ref 78.0–100.0)
Platelets: 342 10*3/uL (ref 150–400)
RBC: 5.18 MIL/uL — ABNORMAL HIGH (ref 3.87–5.11)
RDW: 18.6 % — ABNORMAL HIGH (ref 11.5–15.5)
WBC: 15.8 10*3/uL — ABNORMAL HIGH (ref 4.0–10.5)

## 2012-09-06 LAB — TSH: TSH: 3.178 u[IU]/mL (ref 0.350–4.500)

## 2012-09-09 DIAGNOSIS — J449 Chronic obstructive pulmonary disease, unspecified: Secondary | ICD-10-CM | POA: Insufficient documentation

## 2012-09-09 MED ORDER — GABAPENTIN 300 MG PO CAPS: ORAL_CAPSULE | ORAL | Status: DC

## 2012-09-09 NOTE — Assessment & Plan Note (Signed)
Chronic back pain due to DDD, Disc bulging, will continue to provide her percocet monthly , as she is f/u in 4 months, if she is unable to come in Sept will have to return to HD

## 2012-09-09 NOTE — Progress Notes (Signed)
  Subjective:    Patient ID: [name], female    DOB: 1965/06/16, 47 y.o.   MRN: [id number]  HPI  Patient here to followup chronic medical problems. She continues to have cough but her COPD is improved. She's completed the prednisone. She is gaining 10 pounds since her last visit states that she snacks all throughout the day and she's always tired. She was unable to get in contact with her psychiatrist after our last visit but she did decrease her Klonopin use which is helps her fatigue during the day. She is concerned as she'll be losing her insurance at the end of the month but states she'll be recertified for September will be able to continue seeing me without missing many appointments.  Review of Systems  GEN-+ fatigue, fever, weight loss,weakness, recent illness HEENT- denies eye drainage, change in vision, nasal discharge, CVS- denies chest pain, palpitations RESP- denies SOB, cough, wheeze ABD- denies N/V, change in stools, abd pain GU- denies dysuria, hematuria, dribbling, incontinence MSK- +joint pain, muscle aches, injury Neuro- denies headache, dizziness, syncope, seizure activity      Objective:   Physical Exam GEN- NAD, alert and oriented x3- Room Air , HEENT- PERRL, EOMI, non injected sclera, pink conjunctiva, MMM, oropharynx clear Neck- Supple, no thyromegaly CVS- RRR, no murmur RESP- few scattered wheeze, normal WOB, otherwise clear EXT- No edema Pulses- Radial 2+ DP 2+ Psych- flat affect,depressed appearing, no SI, not anxious appearing        Assessment & Plan:

## 2012-09-09 NOTE — Assessment & Plan Note (Signed)
A1C well controlled, continue metformin

## 2012-09-09 NOTE — Assessment & Plan Note (Signed)
BP stable current meds 1/2 tablet diovan

## 2012-09-09 NOTE — Assessment & Plan Note (Signed)
Gained 10 pounds unsure some of this is due to the prednisone however she is very depressed and very immobile and often sits around he needs throughout the day which she admits today

## 2012-09-09 NOTE — Assessment & Plan Note (Signed)
Currently much improved, has inhalers, continues to smoke On oxygen

## 2012-09-10 ENCOUNTER — Ambulatory Visit (HOSPITAL_COMMUNITY)
Admission: RE | Admit: 2012-09-10 | Discharge: 2012-09-10 | Disposition: A | Discharge status: Home / Self Care | Payer: Medicaid Other | Source: Ambulatory Visit | Attending: Family Medicine | Admitting: Family Medicine

## 2012-09-10 DIAGNOSIS — Z1231 Encounter for screening mammogram for malignant neoplasm of breast: Secondary | ICD-10-CM | POA: Insufficient documentation

## 2012-10-02 ENCOUNTER — Emergency Department (HOSPITAL_COMMUNITY)
Admission: EM | Admit: 2012-10-02 | Discharge: 2012-10-02 | Disposition: A | Discharge status: Home / Self Care | Payer: Self-pay | Attending: Emergency Medicine | Admitting: Emergency Medicine

## 2012-10-02 ENCOUNTER — Emergency Department (HOSPITAL_COMMUNITY): Payer: Self-pay

## 2012-10-02 ENCOUNTER — Encounter (HOSPITAL_COMMUNITY): Payer: Self-pay | Admitting: Emergency Medicine

## 2012-10-02 DIAGNOSIS — G8929 Other chronic pain: Secondary | ICD-10-CM | POA: Insufficient documentation

## 2012-10-02 DIAGNOSIS — D649 Anemia, unspecified: Secondary | ICD-10-CM | POA: Insufficient documentation

## 2012-10-02 DIAGNOSIS — E119 Type 2 diabetes mellitus without complications: Secondary | ICD-10-CM | POA: Insufficient documentation

## 2012-10-02 DIAGNOSIS — R059 Cough, unspecified: Secondary | ICD-10-CM | POA: Insufficient documentation

## 2012-10-02 DIAGNOSIS — J029 Acute pharyngitis, unspecified: Secondary | ICD-10-CM | POA: Insufficient documentation

## 2012-10-02 DIAGNOSIS — R5381 Other malaise: Secondary | ICD-10-CM | POA: Insufficient documentation

## 2012-10-02 DIAGNOSIS — J441 Chronic obstructive pulmonary disease with (acute) exacerbation: Secondary | ICD-10-CM | POA: Insufficient documentation

## 2012-10-02 DIAGNOSIS — J40 Bronchitis, not specified as acute or chronic: Secondary | ICD-10-CM

## 2012-10-02 DIAGNOSIS — M129 Arthropathy, unspecified: Secondary | ICD-10-CM | POA: Insufficient documentation

## 2012-10-02 DIAGNOSIS — J45901 Unspecified asthma with (acute) exacerbation: Secondary | ICD-10-CM | POA: Insufficient documentation

## 2012-10-02 DIAGNOSIS — Z8719 Personal history of other diseases of the digestive system: Secondary | ICD-10-CM | POA: Insufficient documentation

## 2012-10-02 DIAGNOSIS — Z8742 Personal history of other diseases of the female genital tract: Secondary | ICD-10-CM | POA: Insufficient documentation

## 2012-10-02 DIAGNOSIS — F329 Major depressive disorder, single episode, unspecified: Secondary | ICD-10-CM | POA: Insufficient documentation

## 2012-10-02 DIAGNOSIS — R05 Cough: Secondary | ICD-10-CM | POA: Insufficient documentation

## 2012-10-02 DIAGNOSIS — Z9981 Dependence on supplemental oxygen: Secondary | ICD-10-CM | POA: Insufficient documentation

## 2012-10-02 DIAGNOSIS — Z7982 Long term (current) use of aspirin: Secondary | ICD-10-CM | POA: Insufficient documentation

## 2012-10-02 DIAGNOSIS — I10 Essential (primary) hypertension: Secondary | ICD-10-CM | POA: Insufficient documentation

## 2012-10-02 DIAGNOSIS — J9610 Chronic respiratory failure, unspecified whether with hypoxia or hypercapnia: Secondary | ICD-10-CM | POA: Insufficient documentation

## 2012-10-02 DIAGNOSIS — M545 Low back pain, unspecified: Secondary | ICD-10-CM | POA: Insufficient documentation

## 2012-10-02 DIAGNOSIS — R079 Chest pain, unspecified: Secondary | ICD-10-CM | POA: Insufficient documentation

## 2012-10-02 DIAGNOSIS — F411 Generalized anxiety disorder: Secondary | ICD-10-CM | POA: Insufficient documentation

## 2012-10-02 DIAGNOSIS — Z79899 Other long term (current) drug therapy: Secondary | ICD-10-CM | POA: Insufficient documentation

## 2012-10-02 DIAGNOSIS — F17200 Nicotine dependence, unspecified, uncomplicated: Secondary | ICD-10-CM | POA: Insufficient documentation

## 2012-10-02 DIAGNOSIS — K219 Gastro-esophageal reflux disease without esophagitis: Secondary | ICD-10-CM | POA: Insufficient documentation

## 2012-10-02 DIAGNOSIS — R509 Fever, unspecified: Secondary | ICD-10-CM | POA: Insufficient documentation

## 2012-10-02 DIAGNOSIS — R52 Pain, unspecified: Secondary | ICD-10-CM | POA: Insufficient documentation

## 2012-10-02 DIAGNOSIS — Z8679 Personal history of other diseases of the circulatory system: Secondary | ICD-10-CM | POA: Insufficient documentation

## 2012-10-02 DIAGNOSIS — R300 Dysuria: Secondary | ICD-10-CM | POA: Insufficient documentation

## 2012-10-02 DIAGNOSIS — F3289 Other specified depressive episodes: Secondary | ICD-10-CM | POA: Insufficient documentation

## 2012-10-02 LAB — CBC WITH DIFFERENTIAL/PLATELET
Basophils Absolute: 0 10*3/uL (ref 0.0–0.1)
Basophils Relative: 0 % (ref 0–1)
Eosinophils Absolute: 0.2 10*3/uL (ref 0.0–0.7)
Eosinophils Relative: 1 % (ref 0–5)
HCT: 34.1 % — ABNORMAL LOW (ref 36.0–46.0)
Hemoglobin: 10.3 g/dL — ABNORMAL LOW (ref 12.0–15.0)
Lymphocytes Relative: 17 % (ref 12–46)
Lymphs Abs: 2.9 10*3/uL (ref 0.7–4.0)
MCH: 22.4 pg — ABNORMAL LOW (ref 26.0–34.0)
MCHC: 30.2 g/dL (ref 30.0–36.0)
MCV: 74.3 fL — ABNORMAL LOW (ref 78.0–100.0)
Monocytes Absolute: 1.4 10*3/uL — ABNORMAL HIGH (ref 0.1–1.0)
Monocytes Relative: 8 % (ref 3–12)
Neutro Abs: 12.6 10*3/uL — ABNORMAL HIGH (ref 1.7–7.7)
Neutrophils Relative %: 74 % (ref 43–77)
Platelets: 311 10*3/uL (ref 150–400)
RBC: 4.59 MIL/uL (ref 3.87–5.11)
RDW: 17.6 % — ABNORMAL HIGH (ref 11.5–15.5)
WBC: 17.1 10*3/uL — ABNORMAL HIGH (ref 4.0–10.5)

## 2012-10-02 LAB — URINALYSIS, ROUTINE W REFLEX MICROSCOPIC
Glucose, UA: NEGATIVE mg/dL
Hgb urine dipstick: NEGATIVE
Leukocytes, UA: NEGATIVE
Nitrite: NEGATIVE
Protein, ur: NEGATIVE mg/dL
Specific Gravity, Urine: >1.03 — ABNORMAL HIGH (ref 1.005–1.030)
Urobilinogen, UA: 1 mg/dL (ref 0.0–1.0)
pH: 5.5 (ref 5.0–8.0)

## 2012-10-02 LAB — BASIC METABOLIC PANEL
BUN: 9 mg/dL (ref 6–23)
CO2: 30 mEq/L (ref 19–32)
Calcium: 8.9 mg/dL (ref 8.4–10.5)
Chloride: 92 mEq/L — ABNORMAL LOW (ref 96–112)
Creatinine, Ser: 0.69 mg/dL (ref 0.50–1.10)
GFR calc Af Amer: >90 mL/min (ref >90)
GFR calc non Af Amer: >90 mL/min (ref >90)
Glucose, Bld: 129 mg/dL — ABNORMAL HIGH (ref 70–99)
Potassium: 3.3 mEq/L — ABNORMAL LOW (ref 3.5–5.1)
Sodium: 133 mEq/L — ABNORMAL LOW (ref 135–145)

## 2012-10-02 LAB — RAPID STREP SCREEN (MED CTR MEBANE ONLY): Streptococcus, Group A Screen (Direct): NEGATIVE

## 2012-10-02 MED ORDER — LEVOFLOXACIN 500 MG PO TABS: 500.0000 mg | ORAL_TABLET | Freq: Every day | ORAL | Status: DC

## 2012-10-02 MED ORDER — IPRATROPIUM BROMIDE 0.02 % IN SOLN
0.5000 mg | Freq: Once | RESPIRATORY_TRACT | Status: AC
  Administered 2012-10-02: 0.5 mg via RESPIRATORY_TRACT
  Filled 2012-10-02: qty 2.5

## 2012-10-02 MED ORDER — PREDNISONE 20 MG PO TABS: 40.0000 mg | ORAL_TABLET | Freq: Every day | ORAL | Status: DC

## 2012-10-02 MED ORDER — KETOROLAC TROMETHAMINE 30 MG/ML IJ SOLN
15.0000 mg | Freq: Once | INTRAMUSCULAR | Status: AC
  Administered 2012-10-02: 15 mg via INTRAVENOUS
  Filled 2012-10-02: qty 1

## 2012-10-02 MED ORDER — LEVOFLOXACIN 500 MG PO TABS
500.0000 mg | ORAL_TABLET | Freq: Once | ORAL | Status: AC
  Administered 2012-10-02: 500 mg via ORAL
  Filled 2012-10-02: qty 1

## 2012-10-02 MED ORDER — SODIUM CHLORIDE 0.9 % IV BOLUS (SEPSIS)
1000.0000 mL | Freq: Once | INTRAVENOUS | Status: AC
  Administered 2012-10-02: 1000 mL via INTRAVENOUS

## 2012-10-02 MED ORDER — PREDNISONE 50 MG PO TABS
60.0000 mg | ORAL_TABLET | Freq: Once | ORAL | Status: AC
  Administered 2012-10-02: 60 mg via ORAL
  Filled 2012-10-02: qty 1

## 2012-10-02 MED ORDER — ONDANSETRON HCL 4 MG/2ML IJ SOLN
4.0000 mg | Freq: Once | INTRAMUSCULAR | Status: AC
  Administered 2012-10-02: 4 mg via INTRAVENOUS
  Filled 2012-10-02: qty 2

## 2012-10-02 MED ORDER — GUAIFENESIN-CODEINE 100-10 MG/5ML PO SYRP: 5.0000 mL | ORAL_SOLUTION | Freq: Three times a day (TID) | ORAL | Status: DC | PRN

## 2012-10-02 MED ORDER — MORPHINE SULFATE 4 MG/ML IJ SOLN
4.0000 mg | Freq: Once | INTRAMUSCULAR | Status: AC
  Administered 2012-10-02: 4 mg via INTRAVENOUS
  Filled 2012-10-02: qty 1

## 2012-10-02 MED ORDER — ALBUTEROL SULFATE (5 MG/ML) 0.5% IN NEBU
5.0000 mg | INHALATION_SOLUTION | Freq: Once | RESPIRATORY_TRACT | Status: AC
  Administered 2012-10-02: 5 mg via RESPIRATORY_TRACT
  Filled 2012-10-02: qty 1

## 2012-10-02 NOTE — ED Notes (Signed)
Took iv out of left ac it was not documented in chart.

## 2012-10-02 NOTE — ED Notes (Signed)
Pt c/o cough/cold/sore throat/ha since Saturday. Pt states it now feels like it has moved to her chest and her chest hurts with breathing. Some sob.

## 2012-10-02 NOTE — ED Provider Notes (Signed)
History  This chart was scribed for Sheila Razor, MD by Ardelia Mems, ED Scribe. This patient was seen in room [facility] and the patient's care was started at 8:40 PM.   CSN: [id number]  Arrival date & time 10/02/12  1834    Chief Complaint  Patient presents with  . Chest Pain  . Shortness of Breath      The history is provided by the patient. No language interpreter was used.    HPI Comments: [name] is a 47 y.o. female with a h/o COPD who presents to the Emergency Department complaining of constant, mild chest pain and SOB onset 3 days ago. Pt states that she had a productive cough of yellow-brown sputum onset 4 days ago with associated sore throat. Pt also states that she has ben having generalized body aches, fever, weakness and has been feeling very tired for the past 3 days. Pt states that her speech has also been slower for the past 4 days. Pt uses at home O2 to sleep at night and states that she has been having Pulse Ox readings in the low 80's recently. Pt describes her chest pain as "heavy" and reports that it is worsened with inspiration. Pt states that she has chronic back pain which has worsened recently. Pt also states that she has dysuria. Pt believes she has a unspecified virus acquired from family members. Pt denies leg swelling, chills, vomiting, diarrhea or any other symptoms. Pt is a social drinker and a current every day smoker of 0.5 packs/day for 30 years.     Past Medical History  Diagnosis Date  . Diabetes mellitus   . COPD (chronic obstructive pulmonary disease)   . HTN (hypertension)   . Low back pain   . Tachycardia     never had test done since no insurance  . Depression   . Asthma   . Shortness of breath   . Anxiety   . Gastric erosions     EGD 08/2010.  . Internal hemorrhoids     Colonoscopy 5/12.  [name] Heavy menses   . Chronic respiratory failure with hypoxia     On 2-3 L of oxygen at home  . GERD (gastroesophageal reflux disease)    . Anemia   . Arthritis     Past Surgical History  Procedure Laterality Date  . Cholecystectomy  1990  . Cesarean section      twice  . Kidney surgery      as child for blockages  . Tubal ligation    . Tonsillectomy    . Wrist surgery  1995    Lt wrist  . Tympanostomy tube placement    . Hysteroscopy with thermachoice  01/17/2012    Procedure: HYSTEROSCOPY WITH THERMACHOICE;  Surgeon: Lazaro Arms, MD;  Location: [facility];  Service: Gynecology;  Laterality: N/A;  total therapy time: 9:13sec  D5W  18 ml in, D5W   18ml out, temperture 87degrees celcious  . Uterine ablation      Family History  Problem Relation Age of Onset  . Heart attack Father 8    deceased, etoh use  . Heart disease Father   . Heart attack Mother 79    deceased  . Diabetes Mother   . Breast cancer Mother   . Heart failure Mother     oxygen dependence, nonsmoker  . Heart disease Mother   . Depression Mother   . Cancer Mother   . Colon cancer Neg Hx   .  Liver disease Maternal Aunt 40    died while on liver transplant list  . Heart attack Maternal Grandmother     premature CAD  . Ulcers Sister   . Hypertension Sister     History  Substance Use Topics  . Smoking status: Current Every Day Smoker -- 0.50 packs/day for 30 years    Types: Cigarettes  . Smokeless tobacco: Never Used  . Alcohol Use: Yes     Comment: social use    OB History   Grav Para Term Preterm Abortions TAB SAB Ect Mult Living                  Review of Systems  Constitutional: Positive for fatigue. Negative for chills.  HENT: Positive for sore throat.   Respiratory: Positive for cough and shortness of breath.   Cardiovascular: Positive for chest pain. Negative for leg swelling.  Gastrointestinal: Negative for nausea, vomiting and diarrhea.  Genitourinary: Positive for dysuria.  Musculoskeletal: Positive for back pain.  Neurological: Positive for weakness.   A complete 10 system review of systems was obtained and all  systems are negative except as noted in the HPI and PMH.   Allergies  Codeine and Wellbutrin  Home Medications   Current Outpatient Rx  Name  Route  Sig  Dispense  Refill  . albuterol (PROVENTIL HFA) 108 (90 BASE) MCG/ACT inhaler   Inhalation   Inhale 2 puffs into the lungs every 4 (four) hours as needed for wheezing or shortness of breath. Wheezing/shortnes of breath   1 Inhaler   3   . albuterol (PROVENTIL) (5 MG/ML) 0.5% nebulizer solution   Nebulization   Take 2.5 mg by nebulization 4 (four) times daily as needed. For shortness of breath         . citalopram (CELEXA) 40 MG tablet   Oral   Take 1 tablet (40 mg total) by mouth every morning.   30 tablet   6   . clonazePAM (KLONOPIN) 0.5 MG tablet   Oral   Take 0.5 mg by mouth 2 (two) times daily as needed for anxiety.         . ferrous sulfate (IRON SUPPLEMENT) 325 (65 FE) MG tablet   Oral   Take 325 mg by mouth daily with breakfast.         . fluticasone (FLONASE) 50 MCG/ACT nasal spray   Nasal   Place 2 sprays into the nose daily.   16 g   3   . gabapentin (NEURONTIN) 300 MG capsule      Take 1 tab BID and 2 at bedtime   120 capsule   6     New dose   . guaiFENesin (MUCINEX) 600 MG 12 hr tablet   Oral   Take 2 tablets (1,200 mg total) by mouth 2 (two) times daily.   30 tablet   0   . ibuprofen (ADVIL,MOTRIN) 200 MG tablet   Oral   Take 200-400 mg by mouth daily as needed. For pain          . Lancets (ACCU-CHEK MULTICLIX) lancets      USE LANCETS AS DIRECTED ONCE DAILY   102 each   1   . loratadine (CLARITIN) 10 MG tablet   Oral   Take 1 tablet (10 mg total) by mouth daily.   30 tablet   3     For allergies   . metFORMIN (GLUCOPHAGE) 500 MG tablet   Oral   Take 500  mg by mouth 2 (two) times daily with a meal.         . mometasone-formoterol (DULERA) 100-5 MCG/ACT AERO   Inhalation   Inhale 2 puffs into the lungs 2 (two) times daily as needed. For shortness of breath          . Multiple Vitamin (MULTIVITAMIN WITH MINERALS) TABS   Oral   Take 1 tablet by mouth daily.         [name] omeprazole (PRILOSEC) 20 MG capsule   Oral   Take 20 mg by mouth every morning.         [name] oxyCODONE-acetaminophen (PERCOCET) 7.5-325 MG per tablet   Oral   Take 1 tablet by mouth 3 (three) times daily as needed for pain.   90 tablet   0     Do not fill until June 8th   . valsartan (DIOVAN) 80 MG tablet   Oral   Take 40 mg by mouth daily.           Triage Vitals: BP 110/61  Pulse 98  Temp(Src) 97.8 F (36.6 C) (Oral)  Resp 20  Ht 5\' 4"  (1.626 m)  Wt 267 lb (121.11 kg)  BMI 45.81 kg/m2  SpO2 95%  LMP 09/18/2012  Physical Exam  Nursing note and vitals reviewed. Constitutional: She is oriented to person, place, and time. She appears well-developed and well-nourished. No distress.  HENT:  Head: Normocephalic and atraumatic.  TM's scarred bilaterally. Left TM is erythematous, air-fluid level.  Eyes: EOM are normal.  Neck: Normal range of motion.  Cardiovascular: Normal rate, regular rhythm and normal heart sounds.   Pulmonary/Chest: Effort normal and breath sounds normal.  Faint expiratory  wheezing bilaterally, but no increased work of breathing.  Abdominal: Soft. She exhibits no distension. There is no tenderness.  Musculoskeletal: Normal range of motion.  Lower extremities symmetric as compared to each other. No calf tenderness. Negative Homan's. No palpable cords.   Neurological: She is alert and oriented to person, place, and time.  Skin: Skin is warm and dry.  Psychiatric: She has a normal mood and affect. Judgment normal.    ED Course  Procedures (including critical care time)  DIAGNOSTIC STUDIES: Oxygen Saturation is 95% on RA, normal by my interpretation.    COORDINATION OF CARE: 8:44 PM- Pt advised of plan for treatment and pt agrees.  Medications  albuterol (PROVENTIL) (5 MG/ML) 0.5% nebulizer solution 5 mg (5 mg Nebulization Given 10/02/12  2100)  ipratropium (ATROVENT) nebulizer solution 0.5 mg (0.5 mg Nebulization Given 10/02/12 2100)  levofloxacin (LEVAQUIN) tablet 500 mg (500 mg Oral Given 10/02/12 2135)  predniSONE (DELTASONE) tablet 60 mg (60 mg Oral Given 10/02/12 2135)  sodium chloride 0.9 % bolus 1,000 mL (1,000 mLs Intravenous New Bag/Given 10/02/12 2136)  ketorolac (TORADOL) 30 MG/ML injection 15 mg (15 mg Intravenous Given 10/02/12 2135)  morphine 4 MG/ML injection 4 mg (4 mg Intravenous Given 10/02/12 2135)  ondansetron (ZOFRAN) injection 4 mg (4 mg Intravenous Given 10/02/12 2134)      Labs Reviewed  URINALYSIS, ROUTINE W REFLEX MICROSCOPIC - Abnormal; Notable for the following:    Specific Gravity, Urine >1.030 (*)    Bilirubin Urine SMALL (*)    Ketones, ur TRACE (*)    All other components within normal limits  CBC WITH DIFFERENTIAL - Abnormal; Notable for the following:    WBC 17.1 (*)    Hemoglobin 10.3 (*)    HCT 34.1 (*)    MCV 74.3 (*)  MCH 22.4 (*)    RDW 17.6 (*)    Neutro Abs 12.6 (*)    Monocytes Absolute 1.4 (*)    All other components within normal limits  BASIC METABOLIC PANEL - Abnormal; Notable for the following:    Sodium 133 (*)    Potassium 3.3 (*)    Chloride 92 (*)    Glucose, Bld 129 (*)    All other components within normal limits  RAPID STREP SCREEN  CULTURE, GROUP A STREP   Dg Chest 2 View  10/02/2012   *RADIOLOGY REPORT*  Clinical Data: Chest pain.  Diabetic.  Asthma.  Hypertension.  CHEST - 2 VIEW  Comparison: 06/21/2012 and 03/26/2011.  Findings: Chronic lung markings similar to the prior exam.  Heart size top normal.  Mild central pulmonary vascular prominence. Minimal peribronchial thickening.  No pneumothorax.  Calcified aorta.  IMPRESSION: Chronic changes as noted above.   Original Report Authenticated By: Lacy Duverney, M.D.   EKG:  Rhythm: sinus tachycardia Vent. rate 109 BPM PR interval 124 ms QRS duration 84 ms QT/QTc 332/447 ms LAE ST segments: NS ST  changes   1. Bronchitis       MDM  47 year old female with cough, sore throat shortness of breath. Suspect bronchitis/COPD exacerbation. Give her history of COPD, will do a course of antibiotics. Course of steroids. Return precautions discussed. Outpatient followup.    I personally preformed the services scribed in my presence. The recorded information has been reviewed is accurate. Sheila Razor, MD.    Sheila Razor, MD 10/08/12 [phone]

## 2012-10-03 NOTE — ED Provider Notes (Signed)
Pharmacy called. Unable to afford levaquin. Changed to Azithromycin  Juliet Rude. Rubin Payor, MD 10/03/12 1725

## 2012-10-05 LAB — CULTURE, GROUP A STREP

## 2012-10-08 NOTE — ED Provider Notes (Signed)
History     CSN: [id number]  Arrival date & time 05/03/11  1407   First MD Initiated Contact with Patient 05/03/11 1426      Chief Complaint  Patient presents with  . Shortness of Breath    (Consider location/radiation/quality/duration/timing/severity/associated sxs/prior treatment) HPI  Past Medical History  Diagnosis Date  . Diabetes mellitus   . COPD (chronic obstructive pulmonary disease)   . HTN (hypertension)   . Low back pain   . Tachycardia     never had test done since no insurance  . Depression   . Asthma   . Shortness of breath   . Anxiety   . Gastric erosions     EGD 08/2010.  . Internal hemorrhoids     Colonoscopy 5/12.  [name] Heavy menses   . Chronic respiratory failure with hypoxia     On 2-3 L of oxygen at home  . GERD (gastroesophageal reflux disease)   . Anemia   . Arthritis     Past Surgical History  Procedure Laterality Date  . Cholecystectomy  1990  . Cesarean section      twice  . Kidney surgery      as child for blockages  . Tubal ligation    . Tonsillectomy    . Wrist surgery  1995    Lt wrist  . Tympanostomy tube placement    . Hysteroscopy with thermachoice  01/17/2012    Procedure: HYSTEROSCOPY WITH THERMACHOICE;  Surgeon: Lazaro Arms, MD;  Location: [facility];  Service: Gynecology;  Laterality: N/A;  total therapy time: 9:13sec  D5W  18 ml in, D5W   18ml out, temperture 87degrees celcious  . Uterine ablation      Family History  Problem Relation Age of Onset  . Heart attack Father 49    deceased, etoh use  . Heart disease Father   . Heart attack Mother 22    deceased  . Diabetes Mother   . Breast cancer Mother   . Heart failure Mother     oxygen dependence, nonsmoker  . Heart disease Mother   . Depression Mother   . Cancer Mother   . Colon cancer Neg Hx   . Liver disease Maternal Aunt 40    died while on liver transplant list  . Heart attack Maternal Grandmother     premature CAD  . Ulcers Sister   . Hypertension Sister      History  Substance Use Topics  . Smoking status: Current Every Day Smoker -- 0.50 packs/day for 30 years    Types: Cigarettes  . Smokeless tobacco: Never Used  . Alcohol Use: Yes     Comment: social use    OB History   Grav Para Term Preterm Abortions TAB SAB Ect Mult Living                  Review of Systems  Allergies  Codeine and Wellbutrin  Home Medications   Current Outpatient Rx  Name  Route  Sig  Dispense  Refill  . ibuprofen (ADVIL,MOTRIN) 200 MG tablet   Oral   Take 200-400 mg by mouth daily as needed. For pain          . albuterol (PROVENTIL HFA) 108 (90 BASE) MCG/ACT inhaler   Inhalation   Inhale 2 puffs into the lungs every 4 (four) hours as needed for wheezing or shortness of breath. Wheezing/shortnes of breath   1 Inhaler   3   . albuterol (PROVENTIL) (  5 MG/ML) 0.5% nebulizer solution   Nebulization   Take 2.5 mg by nebulization 4 (four) times daily as needed. For shortness of breath         . citalopram (CELEXA) 40 MG tablet   Oral   Take 1 tablet (40 mg total) by mouth every morning.   30 tablet   6   . clonazePAM (KLONOPIN) 0.5 MG tablet   Oral   Take 0.5 mg by mouth 2 (two) times daily as needed for anxiety.         . ferrous sulfate (IRON SUPPLEMENT) 325 (65 FE) MG tablet   Oral   Take 325 mg by mouth daily with breakfast.         . fluticasone (FLONASE) 50 MCG/ACT nasal spray   Nasal   Place 2 sprays into the nose daily.   16 g   3   . gabapentin (NEURONTIN) 300 MG capsule   Oral   Take 300 mg by mouth 3 (three) times daily. Take 1 tab BID and 2 at bedtime         . guaiFENesin-codeine (ROBITUSSIN AC) 100-10 MG/5ML syrup   Oral   Take 5 mLs by mouth 3 (three) times daily as needed for cough.   120 mL   0   . levofloxacin (LEVAQUIN) 500 MG tablet   Oral   Take 1 tablet (500 mg total) by mouth daily.   5 tablet   0   . loratadine (CLARITIN) 10 MG tablet   Oral   Take 1 tablet (10 mg total) by mouth daily.    30 tablet   3     For allergies   . metFORMIN (GLUCOPHAGE) 500 MG tablet   Oral   Take 500 mg by mouth 2 (two) times daily with a meal.         . mometasone-formoterol (DULERA) 100-5 MCG/ACT AERO   Inhalation   Inhale 2 puffs into the lungs 2 (two) times daily as needed. For shortness of breath         . Multiple Vitamin (MULTIVITAMIN WITH MINERALS) TABS   Oral   Take 1 tablet by mouth daily.         [name] ofloxacin (FLOXIN) 0.3 % otic solution   Both Ears   Place 5 drops into both ears daily.         [name] omeprazole (PRILOSEC) 20 MG capsule   Oral   Take 20 mg by mouth every morning.         [name] oxyCODONE-acetaminophen (PERCOCET) 7.5-325 MG per tablet   Oral   Take 1 tablet by mouth 3 (three) times daily as needed for pain.   90 tablet   0     Do not fill until June 8th   . Phenylephrine-DM-GG-APAP (SUDAFED PE COLD/COUGH PO)   Oral   Take 2 tablets by mouth every 4 (four) hours as needed (Congestion).         . predniSONE (DELTASONE) 20 MG tablet   Oral   Take 2 tablets (40 mg total) by mouth daily.   10 tablet   0   . valsartan (DIOVAN) 80 MG tablet   Oral   Take 40 mg by mouth daily.           BP 118/69  Pulse 93  Temp(Src) 97.6 F (36.4 C) (Oral)  Resp 21  Ht 5\' 4"  (1.626 m)  Wt 274 lb 7.6 oz (124.5 kg)  BMI 47.09 kg/m2  SpO2 96%  LMP 04/26/2011  Physical Exam  ED Course  Procedures (including critical care time)  Labs Reviewed  CBC - Abnormal; Notable for the following:    Hemoglobin 9.0 (*)    HCT 31.6 (*)    MCV 69.1 (*)    MCH 19.7 (*)    MCHC 28.5 (*)    RDW 18.9 (*)    All other components within normal limits  BASIC METABOLIC PANEL - Abnormal; Notable for the following:    Sodium 131 (*)    Chloride 94 (*)    Glucose, Bld 153 (*)    All other components within normal limits  URINALYSIS, ROUTINE W REFLEX MICROSCOPIC - Abnormal; Notable for the following:    Color, Urine AMBER (*)    Specific Gravity, Urine <1.005 (*)     Hgb urine dipstick SMALL (*)    Ketones, ur TRACE (*)    All other components within normal limits  BLOOD GAS, ARTERIAL - Abnormal; Notable for the following:    pH, Arterial 7.404 (*)    pO2, Arterial 68.3 (*)    Bicarbonate 25.9 (*)    All other components within normal limits  HEMOGLOBIN A1C - Abnormal; Notable for the following:    Hemoglobin A1C 7.3 (*)    Mean Plasma Glucose 163 (*)    All other components within normal limits  COMPREHENSIVE METABOLIC PANEL - Abnormal; Notable for the following:    Sodium 134 (*)    Glucose, Bld 259 (*)    Albumin 3.3 (*)    AST 41 (*)    ALT 36 (*)    All other components within normal limits  CBC - Abnormal; Notable for the following:    Hemoglobin 8.8 (*)    HCT 31.4 (*)    MCV 69.8 (*)    MCH 19.6 (*)    MCHC 28.0 (*)    RDW 18.8 (*)    All other components within normal limits  GLUCOSE, CAPILLARY - Abnormal; Notable for the following:    Glucose-Capillary 191 (*)    All other components within normal limits  GLUCOSE, CAPILLARY - Abnormal; Notable for the following:    Glucose-Capillary 219 (*)    All other components within normal limits  URINE MICROSCOPIC-ADD ON - Abnormal; Notable for the following:    Squamous Epithelial / LPF MANY (*)    Bacteria, UA FEW (*)    All other components within normal limits  GLUCOSE, CAPILLARY - Abnormal; Notable for the following:    Glucose-Capillary 295 (*)    All other components within normal limits  GLUCOSE, CAPILLARY - Abnormal; Notable for the following:    Glucose-Capillary 255 (*)    All other components within normal limits  GLUCOSE, CAPILLARY - Abnormal; Notable for the following:    Glucose-Capillary 254 (*)    All other components within normal limits  BASIC METABOLIC PANEL - Abnormal; Notable for the following:    Glucose, Bld 241 (*)    All other components within normal limits  CBC - Abnormal; Notable for the following:    WBC 19.4 (*)    Hemoglobin 9.3 (*)    HCT 32.4  (*)    MCV 70.7 (*)    MCH 20.3 (*)    MCHC 28.7 (*)    RDW 19.1 (*)    All other components within normal limits  GLUCOSE, CAPILLARY - Abnormal; Notable for the following:    Glucose-Capillary 303 (*)    All other components  within normal limits  GLUCOSE, CAPILLARY - Abnormal; Notable for the following:    Glucose-Capillary 227 (*)    All other components within normal limits  GLUCOSE, CAPILLARY - Abnormal; Notable for the following:    Glucose-Capillary 233 (*)    All other components within normal limits  GLUCOSE, CAPILLARY - Abnormal; Notable for the following:    Glucose-Capillary 176 (*)    All other components within normal limits  GLUCOSE, CAPILLARY - Abnormal; Notable for the following:    Glucose-Capillary 229 (*)    All other components within normal limits  GLUCOSE, CAPILLARY - Abnormal; Notable for the following:    Glucose-Capillary 230 (*)    All other components within normal limits  GLUCOSE, CAPILLARY - Abnormal; Notable for the following:    Glucose-Capillary 305 (*)    All other components within normal limits  GLUCOSE, CAPILLARY - Abnormal; Notable for the following:    Glucose-Capillary 157 (*)    All other components within normal limits  GLUCOSE, CAPILLARY - Abnormal; Notable for the following:    Glucose-Capillary 354 (*)    All other components within normal limits  GLUCOSE, CAPILLARY - Abnormal; Notable for the following:    Glucose-Capillary 214 (*)    All other components within normal limits  GLUCOSE, CAPILLARY - Abnormal; Notable for the following:    Glucose-Capillary 299 (*)    All other components within normal limits  GLUCOSE, CAPILLARY - Abnormal; Notable for the following:    Glucose-Capillary 329 (*)    All other components within normal limits  GLUCOSE, CAPILLARY - Abnormal; Notable for the following:    Glucose-Capillary 284 (*)    All other components within normal limits  GLUCOSE, CAPILLARY - Abnormal; Notable for the following:     Glucose-Capillary 213 (*)    All other components within normal limits  GLUCOSE, CAPILLARY - Abnormal; Notable for the following:    Glucose-Capillary 187 (*)    All other components within normal limits  URINE CULTURE  CULTURE, BLOOD (ROUTINE X 2)  CULTURE, BLOOD (ROUTINE X 2)  MRSA PCR SCREENING  DIFFERENTIAL  LEGIONELLA ANTIGEN, URINE  STREP PNEUMONIAE URINARY ANTIGEN   No results found.   1. COPD (chronic obstructive pulmonary disease)   2. Pneumonia   3. Microcytic anemia   4. GERD (gastroesophageal reflux disease)   5. COPD with acute exacerbation   6. HTN (hypertension)   7. DM type 2 (diabetes mellitus, type 2)   8. Tobacco abuse   9. Gastric erosions   10. Sinus tachycardia   11. Hyponatremia   12. Depression   13. Leukocytosis   14. Morbid obesity   15. Abdominal pain, generalized   16. Diarrhea   17. Esophageal dysphagia   18. Hypoxia   19. Hypokalemia   20. Respiratory failure with hypoxia   21. Bilateral pneumonia       MDM  See other note.        Raeford Razor, MD 10/08/12 [phone]

## 2012-10-20 ENCOUNTER — Encounter (HOSPITAL_COMMUNITY): Payer: Self-pay | Admitting: *Deleted

## 2012-10-20 ENCOUNTER — Emergency Department (HOSPITAL_COMMUNITY)
Admission: EM | Admit: 2012-10-20 | Discharge: 2012-10-20 | Disposition: A | Discharge status: Home / Self Care | Payer: Self-pay | Attending: Emergency Medicine | Admitting: Emergency Medicine

## 2012-10-20 ENCOUNTER — Emergency Department (HOSPITAL_COMMUNITY): Payer: Self-pay

## 2012-10-20 DIAGNOSIS — J449 Chronic obstructive pulmonary disease, unspecified: Secondary | ICD-10-CM | POA: Insufficient documentation

## 2012-10-20 DIAGNOSIS — F17200 Nicotine dependence, unspecified, uncomplicated: Secondary | ICD-10-CM | POA: Insufficient documentation

## 2012-10-20 DIAGNOSIS — F329 Major depressive disorder, single episode, unspecified: Secondary | ICD-10-CM | POA: Insufficient documentation

## 2012-10-20 DIAGNOSIS — K219 Gastro-esophageal reflux disease without esophagitis: Secondary | ICD-10-CM | POA: Insufficient documentation

## 2012-10-20 DIAGNOSIS — F3289 Other specified depressive episodes: Secondary | ICD-10-CM | POA: Insufficient documentation

## 2012-10-20 DIAGNOSIS — J4489 Other specified chronic obstructive pulmonary disease: Secondary | ICD-10-CM | POA: Insufficient documentation

## 2012-10-20 DIAGNOSIS — Z79899 Other long term (current) drug therapy: Secondary | ICD-10-CM | POA: Insufficient documentation

## 2012-10-20 DIAGNOSIS — Z8719 Personal history of other diseases of the digestive system: Secondary | ICD-10-CM | POA: Insufficient documentation

## 2012-10-20 DIAGNOSIS — M1990 Unspecified osteoarthritis, unspecified site: Secondary | ICD-10-CM | POA: Insufficient documentation

## 2012-10-20 DIAGNOSIS — E119 Type 2 diabetes mellitus without complications: Secondary | ICD-10-CM | POA: Insufficient documentation

## 2012-10-20 DIAGNOSIS — M79671 Pain in right foot: Secondary | ICD-10-CM

## 2012-10-20 DIAGNOSIS — Z8679 Personal history of other diseases of the circulatory system: Secondary | ICD-10-CM | POA: Insufficient documentation

## 2012-10-20 DIAGNOSIS — D649 Anemia, unspecified: Secondary | ICD-10-CM | POA: Insufficient documentation

## 2012-10-20 DIAGNOSIS — I10 Essential (primary) hypertension: Secondary | ICD-10-CM | POA: Insufficient documentation

## 2012-10-20 DIAGNOSIS — J9610 Chronic respiratory failure, unspecified whether with hypoxia or hypercapnia: Secondary | ICD-10-CM | POA: Insufficient documentation

## 2012-10-20 DIAGNOSIS — Z8742 Personal history of other diseases of the female genital tract: Secondary | ICD-10-CM | POA: Insufficient documentation

## 2012-10-20 DIAGNOSIS — M25579 Pain in unspecified ankle and joints of unspecified foot: Secondary | ICD-10-CM | POA: Insufficient documentation

## 2012-10-20 DIAGNOSIS — F411 Generalized anxiety disorder: Secondary | ICD-10-CM | POA: Insufficient documentation

## 2012-10-20 MED ORDER — OXYCODONE-ACETAMINOPHEN 5-325 MG PO TABS
2.0000 | ORAL_TABLET | Freq: Once | ORAL | Status: AC
  Administered 2012-10-20: 2 via ORAL
  Filled 2012-10-20: qty 2

## 2012-10-20 NOTE — ED Notes (Signed)
Patient with no complaints at this time. Respirations even and unlabored. Skin warm/dry. Discharge instructions reviewed with patient at this time. Patient given opportunity to voice concerns/ask questions. Patient discharged at this time and left Emergency Department with steady gait.   

## 2012-10-20 NOTE — ED Notes (Signed)
Pt c/o bilateral feet pain burning that started  a few nights ago, right foot pain became worse yesterday, pt states that she is unable to walk on right heel area. Denies any injury. Pt also recently tx for bronchitis states that she is not sure if she is any better or not

## 2012-10-20 NOTE — ED Provider Notes (Signed)
History  This chart was scribed for Joya Gaskins, MD by Manuela Schwartz, ED scribe. This patient was seen in room [facility] and the patient's care was started at 1509.  CSN: [id number] Arrival date & time 10/20/12  1435  First MD Initiated Contact with Patient 10/20/12 [phone]     Chief Complaint  Patient presents with  . Foot Pain   Patient is a 47 y.o. female presenting with lower extremity pain. The history is provided by the patient. No language interpreter was used.  Foot Pain This is a new problem. The current episode started yesterday. The problem occurs constantly. The problem has not changed since onset.Pertinent negatives include no chest pain, no abdominal pain and no shortness of breath. The symptoms are aggravated by walking. Nothing relieves the symptoms. She has tried nothing for the symptoms.   HPI Comments: [name] is a 47 y.o. female who presents to the Emergency Department complaining of moderate burning pain to her right foot since yesterday morning when she woke up, she denies injury/trauma to her foot/leg. She states difficulty walking secondary to pain which sometimes radiates up her right leg w/twisting movements but otherwise localized to her right foot. She denies swelling, emesis, fever/chills. She denies hx of DVT or PE.    Past Medical History  Diagnosis Date  . Diabetes mellitus   . COPD (chronic obstructive pulmonary disease)   . HTN (hypertension)   . Low back pain   . Tachycardia     never had test done since no insurance  . Depression   . Asthma   . Shortness of breath   . Anxiety   . Gastric erosions     EGD 08/2010.  . Internal hemorrhoids     Colonoscopy 5/12.  [name] Heavy menses   . Chronic respiratory failure with hypoxia     On 2-3 L of oxygen at home  . GERD (gastroesophageal reflux disease)   . Anemia   . Arthritis    Past Surgical History  Procedure Laterality Date  . Cholecystectomy  1990  . Cesarean section      twice  .  Kidney surgery      as child for blockages  . Tubal ligation    . Tonsillectomy    . Wrist surgery  1995    Lt wrist  . Tympanostomy tube placement    . Hysteroscopy with thermachoice  01/17/2012    Procedure: HYSTEROSCOPY WITH THERMACHOICE;  Surgeon: Lazaro Arms, MD;  Location: [facility];  Service: Gynecology;  Laterality: N/A;  total therapy time: 9:13sec  D5W  18 ml in, D5W   18ml out, temperture 87degrees celcious  . Uterine ablation     Family History  Problem Relation Age of Onset  . Heart attack Father 21    deceased, etoh use  . Heart disease Father   . Heart attack Mother 88    deceased  . Diabetes Mother   . Breast cancer Mother   . Heart failure Mother     oxygen dependence, nonsmoker  . Heart disease Mother   . Depression Mother   . Cancer Mother   . Colon cancer Neg Hx   . Liver disease Maternal Aunt 40    died while on liver transplant list  . Heart attack Maternal Grandmother     premature CAD  . Ulcers Sister   . Hypertension Sister    History  Substance Use Topics  . Smoking status: Current Every Day Smoker --  0.50 packs/day for 30 years    Types: Cigarettes  . Smokeless tobacco: Never Used  . Alcohol Use: Yes     Comment: social use   OB History   Grav Para Term Preterm Abortions TAB SAB Ect Mult Living                 Review of Systems  Constitutional: Negative for fever and chills.  HENT: Negative for rhinorrhea, neck pain and neck stiffness.   Eyes: Negative for visual disturbance.  Respiratory: Negative for shortness of breath.   Cardiovascular: Negative for chest pain.  Gastrointestinal: Positive for nausea (secondary to pain). Negative for vomiting and abdominal pain.  Genitourinary: Negative for flank pain.  Musculoskeletal: Positive for gait problem (secondary to right foot pain). Negative for back pain.       Burning/pain of her right foot  Skin: Negative for color change and pallor.  Neurological: Negative for syncope and weakness.        Burning of her right foot   All other systems reviewed and are negative.  A complete 10 system review of systems was obtained and all systems are negative except as noted in the HPI and PMH.    Allergies  Codeine and Wellbutrin  Home Medications   Current Outpatient Rx  Name  Route  Sig  Dispense  Refill  . albuterol (PROVENTIL HFA) 108 (90 BASE) MCG/ACT inhaler   Inhalation   Inhale 2 puffs into the lungs every 4 (four) hours as needed for wheezing or shortness of breath. Wheezing/shortnes of breath   1 Inhaler   3   . albuterol (PROVENTIL) (5 MG/ML) 0.5% nebulizer solution   Nebulization   Take 2.5 mg by nebulization 4 (four) times daily as needed. For shortness of breath         . citalopram (CELEXA) 40 MG tablet   Oral   Take 1 tablet (40 mg total) by mouth every morning.   30 tablet   6   . clonazePAM (KLONOPIN) 0.5 MG tablet   Oral   Take 0.5 mg by mouth 2 (two) times daily as needed for anxiety.         . ferrous sulfate (IRON SUPPLEMENT) 325 (65 FE) MG tablet   Oral   Take 325 mg by mouth daily with breakfast.         . fluticasone (FLONASE) 50 MCG/ACT nasal spray   Nasal   Place 2 sprays into the nose daily.   16 g   3   . gabapentin (NEURONTIN) 300 MG capsule   Oral   Take 300 mg by mouth 3 (three) times daily. Take 1 tab BID and 2 at bedtime         . guaiFENesin-codeine (ROBITUSSIN AC) 100-10 MG/5ML syrup   Oral   Take 5 mLs by mouth 3 (three) times daily as needed for cough.   120 mL   0   . ibuprofen (ADVIL,MOTRIN) 200 MG tablet   Oral   Take 200-400 mg by mouth daily as needed. For pain          . levofloxacin (LEVAQUIN) 500 MG tablet   Oral   Take 1 tablet (500 mg total) by mouth daily.   5 tablet   0   . loratadine (CLARITIN) 10 MG tablet   Oral   Take 1 tablet (10 mg total) by mouth daily.   30 tablet   3     For allergies   . metFORMIN (  GLUCOPHAGE) 500 MG tablet   Oral   Take 500 mg by mouth 2 (two) times daily  with a meal.         . mometasone-formoterol (DULERA) 100-5 MCG/ACT AERO   Inhalation   Inhale 2 puffs into the lungs 2 (two) times daily as needed. For shortness of breath         . Multiple Vitamin (MULTIVITAMIN WITH MINERALS) TABS   Oral   Take 1 tablet by mouth daily.         [name] ofloxacin (FLOXIN) 0.3 % otic solution   Both Ears   Place 5 drops into both ears daily.         [name] omeprazole (PRILOSEC) 20 MG capsule   Oral   Take 20 mg by mouth every morning.         [name] oxyCODONE-acetaminophen (PERCOCET) 7.5-325 MG per tablet   Oral   Take 1 tablet by mouth 3 (three) times daily as needed for pain.   90 tablet   0     Do not fill until June 8th   . Phenylephrine-DM-GG-APAP (SUDAFED PE COLD/COUGH PO)   Oral   Take 2 tablets by mouth every 4 (four) hours as needed (Congestion).         . predniSONE (DELTASONE) 20 MG tablet   Oral   Take 2 tablets (40 mg total) by mouth daily.   10 tablet   0   . valsartan (DIOVAN) 80 MG tablet   Oral   Take 40 mg by mouth daily.          Triage Vitals: BP 125/58  Pulse 114  Temp(Src) 98.8 F (37.1 C) (Oral)  Resp 18  SpO2 100%  LMP 09/18/2012 Physical Exam  CONSTITUTIONAL: Well developed/well nourished HEAD AND FACE: Normocephalic/atraumatic EYES: EOMI/PERRL ENMT: Mucous membranes moist NECK: supple no meningeal signs SPINE:entire spine nontender CV: S1/S2 noted, no murmurs/rubs/gallops noted LUNGS: Lungs are clear to auscultation bilaterally, no apparent distress ABDOMEN: soft, nontender, no rebound or guarding GU:no cva tenderness NEURO: Pt is awake/alert, moves all extremitiesx4 EXTREMITIES: pulses normal, full ROM, tenderness to lateral aspect of right foot, no bruising, erythema, or edema. No signs of trauma.   No right ankle tenderness.  Full rom of right knee/ankle.  No calf tenderness.  Right achilles intact  SKIN: warm, color normal PSYCH: no abnormalities of mood noted ED Course  Procedures   DIAGNOSTIC STUDIES: Oxygen Saturation is 100% on room air, normal by my interpretation.    COORDINATION OF CARE: At 340 PM Discussed treatment plan with patient which includes right foot X-ray, pain medicine. Patient agrees.  Xray negative She is neurovascularly intact to right LE No signs of DVT No signs of infectious etiology She reports it started to worsen after "flare" of her neuropathy Stable for d/c    MDM  Nursing notes including past medical history and social history reviewed and considered in documentation xrays reviewed and considered     I personally performed the services described in this documentation, which was scribed in my presence. The recorded information has been reviewed and is accurate.    Joya Gaskins, MD 10/20/12 [phone]

## 2012-10-28 ENCOUNTER — Telehealth: Payer: Self-pay | Admitting: Family Medicine

## 2012-10-28 NOTE — Telephone Encounter (Signed)
Ok to refill 

## 2012-10-29 MED ORDER — OXYCODONE-ACETAMINOPHEN 7.5-325 MG PO TABS: 1.0000 | ORAL_TABLET | Freq: Three times a day (TID) | ORAL | Status: DC | PRN

## 2012-10-29 NOTE — Telephone Encounter (Signed)
Med signed and ready for pick up °

## 2012-10-29 NOTE — Addendum Note (Signed)
Addended by: Elvina Mattes T on: 10/29/2012 10:33 AM   Modules accepted: Orders

## 2012-10-29 NOTE — Telephone Encounter (Signed)
Left message on voice mail RX ready fro pickup

## 2012-10-29 NOTE — Telephone Encounter (Signed)
Ok to refill 

## 2012-11-26 ENCOUNTER — Telehealth: Payer: Self-pay | Admitting: Family Medicine

## 2012-11-26 MED ORDER — OXYCODONE-ACETAMINOPHEN 7.5-325 MG PO TABS: 1.0000 | ORAL_TABLET | Freq: Three times a day (TID) | ORAL | Status: DC | PRN

## 2012-11-26 NOTE — Telephone Encounter (Signed)
rx printed for provider signature. 

## 2012-11-26 NOTE — Telephone Encounter (Signed)
?  ok to refill °

## 2012-11-26 NOTE — Telephone Encounter (Signed)
Okay to refill? 

## 2012-12-09 ENCOUNTER — Encounter (HOSPITAL_COMMUNITY): Payer: Self-pay | Admitting: Emergency Medicine

## 2012-12-09 ENCOUNTER — Emergency Department (HOSPITAL_COMMUNITY)
Admission: EM | Admit: 2012-12-09 | Discharge: 2012-12-09 | Disposition: A | Discharge status: Home / Self Care | Payer: Self-pay | Attending: Emergency Medicine | Admitting: Emergency Medicine

## 2012-12-09 ENCOUNTER — Emergency Department (HOSPITAL_COMMUNITY): Payer: Self-pay

## 2012-12-09 DIAGNOSIS — E119 Type 2 diabetes mellitus without complications: Secondary | ICD-10-CM | POA: Insufficient documentation

## 2012-12-09 DIAGNOSIS — Z8679 Personal history of other diseases of the circulatory system: Secondary | ICD-10-CM | POA: Insufficient documentation

## 2012-12-09 DIAGNOSIS — F3289 Other specified depressive episodes: Secondary | ICD-10-CM | POA: Insufficient documentation

## 2012-12-09 DIAGNOSIS — Z8719 Personal history of other diseases of the digestive system: Secondary | ICD-10-CM | POA: Insufficient documentation

## 2012-12-09 DIAGNOSIS — R110 Nausea: Secondary | ICD-10-CM | POA: Insufficient documentation

## 2012-12-09 DIAGNOSIS — M129 Arthropathy, unspecified: Secondary | ICD-10-CM | POA: Insufficient documentation

## 2012-12-09 DIAGNOSIS — Z8742 Personal history of other diseases of the female genital tract: Secondary | ICD-10-CM | POA: Insufficient documentation

## 2012-12-09 DIAGNOSIS — R52 Pain, unspecified: Secondary | ICD-10-CM | POA: Insufficient documentation

## 2012-12-09 DIAGNOSIS — R05 Cough: Secondary | ICD-10-CM | POA: Insufficient documentation

## 2012-12-09 DIAGNOSIS — R000 Tachycardia, unspecified: Secondary | ICD-10-CM | POA: Insufficient documentation

## 2012-12-09 DIAGNOSIS — F17200 Nicotine dependence, unspecified, uncomplicated: Secondary | ICD-10-CM | POA: Insufficient documentation

## 2012-12-09 DIAGNOSIS — I10 Essential (primary) hypertension: Secondary | ICD-10-CM | POA: Insufficient documentation

## 2012-12-09 DIAGNOSIS — K219 Gastro-esophageal reflux disease without esophagitis: Secondary | ICD-10-CM | POA: Insufficient documentation

## 2012-12-09 DIAGNOSIS — R5381 Other malaise: Secondary | ICD-10-CM | POA: Insufficient documentation

## 2012-12-09 DIAGNOSIS — F411 Generalized anxiety disorder: Secondary | ICD-10-CM | POA: Insufficient documentation

## 2012-12-09 DIAGNOSIS — Z79899 Other long term (current) drug therapy: Secondary | ICD-10-CM | POA: Insufficient documentation

## 2012-12-09 DIAGNOSIS — D649 Anemia, unspecified: Secondary | ICD-10-CM | POA: Insufficient documentation

## 2012-12-09 DIAGNOSIS — R61 Generalized hyperhidrosis: Secondary | ICD-10-CM | POA: Insufficient documentation

## 2012-12-09 DIAGNOSIS — R197 Diarrhea, unspecified: Secondary | ICD-10-CM | POA: Insufficient documentation

## 2012-12-09 DIAGNOSIS — Z8709 Personal history of other diseases of the respiratory system: Secondary | ICD-10-CM | POA: Insufficient documentation

## 2012-12-09 DIAGNOSIS — R059 Cough, unspecified: Secondary | ICD-10-CM | POA: Insufficient documentation

## 2012-12-09 DIAGNOSIS — F329 Major depressive disorder, single episode, unspecified: Secondary | ICD-10-CM | POA: Insufficient documentation

## 2012-12-09 DIAGNOSIS — D72829 Elevated white blood cell count, unspecified: Secondary | ICD-10-CM | POA: Insufficient documentation

## 2012-12-09 DIAGNOSIS — J441 Chronic obstructive pulmonary disease with (acute) exacerbation: Secondary | ICD-10-CM | POA: Insufficient documentation

## 2012-12-09 DIAGNOSIS — R6883 Chills (without fever): Secondary | ICD-10-CM | POA: Insufficient documentation

## 2012-12-09 HISTORY — DX: Unspecified asthma, uncomplicated: J45.909

## 2012-12-09 LAB — CBC WITH DIFFERENTIAL/PLATELET
Basophils Absolute: 0 10*3/uL (ref 0.0–0.1)
Basophils Relative: 0 % (ref 0–1)
Eosinophils Absolute: 0 10*3/uL (ref 0.0–0.7)
Eosinophils Relative: 0 % (ref 0–5)
HCT: 40.1 % (ref 36.0–46.0)
Hemoglobin: 12.3 g/dL (ref 12.0–15.0)
Lymphocytes Relative: 22 % (ref 12–46)
Lymphs Abs: 3.8 10*3/uL (ref 0.7–4.0)
MCH: 22.6 pg — ABNORMAL LOW (ref 26.0–34.0)
MCHC: 30.7 g/dL (ref 30.0–36.0)
MCV: 73.6 fL — ABNORMAL LOW (ref 78.0–100.0)
Monocytes Absolute: 1.4 10*3/uL — ABNORMAL HIGH (ref 0.1–1.0)
Monocytes Relative: 8 % (ref 3–12)
Neutro Abs: 12.1 10*3/uL — ABNORMAL HIGH (ref 1.7–7.7)
Neutrophils Relative %: 70 % (ref 43–77)
Platelets: 287 10*3/uL (ref 150–400)
RBC: 5.45 MIL/uL — ABNORMAL HIGH (ref 3.87–5.11)
RDW: 18 % — ABNORMAL HIGH (ref 11.5–15.5)
WBC: 17.3 10*3/uL — ABNORMAL HIGH (ref 4.0–10.5)

## 2012-12-09 LAB — COMPREHENSIVE METABOLIC PANEL
ALT: 9 U/L (ref 0–35)
AST: 15 U/L (ref 0–37)
Albumin: 3.9 g/dL (ref 3.5–5.2)
Alkaline Phosphatase: 52 U/L (ref 39–117)
BUN: 12 mg/dL (ref 6–23)
CO2: 24 mEq/L (ref 19–32)
Calcium: 9.4 mg/dL (ref 8.4–10.5)
Chloride: 94 mEq/L — ABNORMAL LOW (ref 96–112)
Creatinine, Ser: 0.89 mg/dL (ref 0.50–1.10)
GFR calc Af Amer: 89 mL/min — ABNORMAL LOW (ref >90)
GFR calc non Af Amer: 77 mL/min — ABNORMAL LOW (ref >90)
Glucose, Bld: 113 mg/dL — ABNORMAL HIGH (ref 70–99)
Potassium: 3.9 mEq/L (ref 3.5–5.1)
Sodium: 133 mEq/L — ABNORMAL LOW (ref 135–145)
Total Bilirubin: 0.5 mg/dL (ref 0.3–1.2)
Total Protein: 8.1 g/dL (ref 6.0–8.3)

## 2012-12-09 LAB — URINALYSIS, ROUTINE W REFLEX MICROSCOPIC
Glucose, UA: NEGATIVE mg/dL
Ketones, ur: 15 mg/dL — AB
Leukocytes, UA: NEGATIVE
Nitrite: NEGATIVE
Specific Gravity, Urine: >1.03 — ABNORMAL HIGH (ref 1.005–1.030)
Urobilinogen, UA: 0.2 mg/dL (ref 0.0–1.0)
pH: 6 (ref 5.0–8.0)

## 2012-12-09 LAB — URINE MICROSCOPIC-ADD ON

## 2012-12-09 MED ORDER — SODIUM CHLORIDE 0.9 % IV BOLUS (SEPSIS)
1000.0000 mL | Freq: Once | INTRAVENOUS | Status: AC
  Administered 2012-12-09: 1000 mL via INTRAVENOUS

## 2012-12-09 MED ORDER — METOCLOPRAMIDE HCL 5 MG/ML IJ SOLN
10.0000 mg | Freq: Once | INTRAMUSCULAR | Status: AC
  Administered 2012-12-09: 10 mg via INTRAVENOUS
  Filled 2012-12-09: qty 2

## 2012-12-09 MED ORDER — ONDANSETRON HCL 4 MG/2ML IJ SOLN
4.0000 mg | Freq: Once | INTRAMUSCULAR | Status: AC
  Administered 2012-12-09: 4 mg via INTRAVENOUS
  Filled 2012-12-09: qty 2

## 2012-12-09 MED ORDER — OXYCODONE-ACETAMINOPHEN 5-325 MG PO TABS: 1.0000 | ORAL_TABLET | ORAL | Status: DC | PRN

## 2012-12-09 MED ORDER — HYDROMORPHONE HCL PF 1 MG/ML IJ SOLN
1.0000 mg | Freq: Once | INTRAMUSCULAR | Status: AC
  Administered 2012-12-09: 1 mg via INTRAVENOUS
  Filled 2012-12-09: qty 1

## 2012-12-09 NOTE — ED Notes (Signed)
Patient c/o generalized body aches and diarrhea; states has been in the bed since Saturday.  Patient states she feels weak.

## 2012-12-09 NOTE — ED Notes (Signed)
Pt presents with c/o abdominal pain x 3 days and diarrhea. Pt states has been weak and unable to get out of bed x 2 days. Abdominal cramps had worsened throughout the day. Pt has had diarrhea x 10 Saturday  And lessoned on Sunday.  Pt denies emesis. NAD noted at this time. Moist cough noted, pt states always has cough , history of smoking and COPD. EDP at bedside to examine pt.

## 2012-12-09 NOTE — ED Notes (Signed)
Pt assisted to restroom by rad tech. Radiology tech reports pt needed to use the bathroom while in that department also , reporting loose stools. Pt assisted back to bed, steady gait noted.

## 2012-12-09 NOTE — ED Provider Notes (Signed)
CSN: [id number]     Arrival date & time 12/09/12  1905 History  This chart was scribed for Sheila Quarry, MD by [name], ED Scribe. This patient was seen in room [facility]11/[facility]11 and the patient's care was started at 8:58 PM.   Chief Complaint  Patient presents with  . Abdominal Pain  . Generalized Body Aches    Patient is [name] 47 y.o. female presenting with diarrhea. The history is provided by the patient. No language interpreter was used.  Diarrhea Quality:  Mucous and watery Number of episodes:  Every hour Duration:  2 days Progression:  Unchanged Associated symptoms: abdominal pain and chills   Associated symptoms: no fever and no vomiting   Risk factors: no recent antibiotic use and no sick contacts     HPI Comments: [name] is [name] 47 y.o. female who presents to the Emergency Department complaining of 2 days of diarrhea with associated fatigue, diaphoresis, chills, nausea and diffuse abdominal pain. She denies having any fevers or emesis as associated symptoms. She describes the diarrhea as occuring every hour and appearing as [name] yellow color with mucus. She states that she has been trying to stay hydrated by drinking water but states that "everything goes through me". She states that she started taking imodium yesterday with no improvement. She denies any recent antibiotic use or recent sick contacts. She has [name] h/o prior cholecystectomy but denies any other prior abdominal surgeries. LNMP was in June 2014, but she reports having an uterine ablation. She also c/o [name] dry cough started yesterday. She states that she feels SOB with exertion although she has [name] h/o COPD and is [name] smoker. She reports being on O2 PRN and denies needing to use her albuterol inhalers since the onset. She has [name] h/o DM controlled with metformin. Last CBG was 135 yesterday.   PCP is Dr. Jeanice [name]   Past Medical History  Diagnosis Date  . Diabetes mellitus   . COPD (chronic obstructive pulmonary disease)    . HTN (hypertension)   . Low back pain   . Tachycardia     never had test done since no insurance  . Depression   . Asthma   . Shortness of breath   . Anxiety   . Gastric erosions     EGD 08/2010.  . Internal hemorrhoids     Colonoscopy 5/12.  [name] Heavy menses   . Chronic respiratory failure with hypoxia     On 2-3 L of oxygen at home  . GERD (gastroesophageal reflux disease)   . Anemia   . Arthritis    Past Surgical History  Procedure Laterality Date  . Cholecystectomy  1990  . Cesarean section      twice  . Kidney surgery      as child for blockages  . Tubal ligation    . Tonsillectomy    . Wrist surgery  1995    Lt wrist  . Tympanostomy tube placement    . Hysteroscopy with thermachoice  01/17/2012    Procedure: HYSTEROSCOPY WITH THERMACHOICE;  Surgeon: Sheila Arms, MD;  Location: [facility];  Service: Gynecology;  Laterality: N/[name];  total therapy time: 9:13sec  D5W  18 ml in, D5W   18ml out, temperture 87degrees celcious  . Uterine ablation     Family History  Problem Relation Age of Onset  . Heart attack Father 10    deceased, etoh use  . Heart disease Father   . Heart attack Mother  55    deceased  . Diabetes Mother   . Breast cancer Mother   . Heart failure Mother     oxygen dependence, nonsmoker  . Heart disease Mother   . Depression Mother   . Cancer Mother   . Colon cancer Neg Hx   . Liver disease Maternal Aunt 40    died while on liver transplant list  . Heart attack Maternal Grandmother     premature CAD  . Ulcers Sister   . Hypertension Sister    History  Substance Use Topics  . Smoking status: Current Every Day Smoker -- 0.50 packs/day for 30 years    Types: Cigarettes  . Smokeless tobacco: Never Used  . Alcohol Use: Yes     Comment: social use   No OB history provided.  Review of Systems  Constitutional: Positive for chills and fatigue. Negative for fever.  Respiratory: Positive for cough and shortness of breath.   Cardiovascular:  Negative for chest pain.  Gastrointestinal: Positive for nausea, abdominal pain and diarrhea. Negative for vomiting.  All other systems reviewed and are negative.    Allergies  Codeine and Wellbutrin  Home Medications   Current Outpatient Rx  Name  Route  Sig  Dispense  Refill  . albuterol (PROVENTIL HFA) 108 (90 BASE) MCG/ACT inhaler   Inhalation   Inhale 2 puffs into the lungs every 4 (four) hours as needed for wheezing or shortness of breath. Wheezing/shortnes of breath   1 Inhaler   3   . EXPIRED: albuterol (PROVENTIL) (5 MG/ML) 0.5% nebulizer solution   Nebulization   Take 2.5 mg by nebulization 4 (four) times daily as needed. For shortness of breath         . citalopram (CELEXA) 40 MG tablet   Oral   Take 1 tablet (40 mg total) by mouth every morning.   30 tablet   6   . clonazePAM (KLONOPIN) 0.5 MG tablet   Oral   Take 0.5 mg by mouth at bedtime.          . ferrous sulfate (IRON SUPPLEMENT) 325 (65 FE) MG tablet   Oral   Take 325 mg by mouth daily with breakfast.         . fluticasone (FLONASE) 50 MCG/ACT nasal spray   Nasal   Place 2 sprays into the nose daily.   16 g   3   . gabapentin (NEURONTIN) 300 MG capsule   Oral   Take 300-600 mg by mouth 3 (three) times daily. Take 1 tab twice daily  and 2 at bedtime         . ibuprofen (ADVIL,MOTRIN) 200 MG tablet   Oral   Take 200-400 mg by mouth every 6 (six) hours as needed for pain. For pain         . loratadine (CLARITIN) 10 MG tablet   Oral   Take 1 tablet (10 mg total) by mouth daily.   30 tablet   3     For allergies   . metFORMIN (GLUCOPHAGE) 500 MG tablet   Oral   Take 500 mg by mouth 2 (two) times daily with [name] meal.         . mometasone-formoterol (DULERA) 100-5 MCG/ACT AERO   Inhalation   Inhale 2 puffs into the lungs 2 (two) times daily as needed. For shortness of breath         . Multiple Vitamin (MULTIVITAMIN WITH MINERALS) TABS   Oral   Take  1 tablet by mouth daily.          [name] ofloxacin (FLOXIN) 0.3 % otic solution   Both Ears   Place 5 drops into both ears daily as needed (for relief).          [name] omeprazole (PRILOSEC) 20 MG capsule   Oral   Take 20 mg by mouth every morning.         [name] oxyCODONE-acetaminophen (PERCOCET) 7.5-325 MG per tablet   Oral   Take 1 tablet by mouth 3 (three) times daily as needed for pain.   90 tablet   0   . valsartan (DIOVAN) 80 MG tablet   Oral   Take 40 mg by mouth daily.          Triage Vitals: BP 122/78  Pulse 103  Temp(Src) 98.8 F (37.1 C) (Oral)  Resp 20  Ht 5\' 4"  (1.626 m)  Wt 270 lb (122.471 kg)  BMI 46.32 kg/m2  SpO2 99%  Physical Exam  Nursing note and vitals reviewed. Constitutional: She is oriented to person, place, and time. She appears well-developed and well-nourished. No distress.  HENT:  Head: Normocephalic and atraumatic.  Right Ear: External ear normal.  Left Ear: External ear normal.  Nose: Nose normal.  Mouth/Throat: Oropharynx is clear and moist.  Eyes: Conjunctivae and EOM are normal. Pupils are equal, round, and reactive to light.  Neck: Normal range of motion. No tracheal deviation present.  Cardiovascular: Regular rhythm, normal heart sounds and intact distal pulses.  Tachycardia present.   HR of 108  Pulmonary/Chest: Effort normal and breath sounds normal. No respiratory distress.  Abdominal: Soft. There is tenderness (mild diffuse tenderness).  Musculoskeletal: Normal range of motion. She exhibits no edema.  Neurological: She is alert and oriented to person, place, and time. No cranial nerve deficit.  Skin: Skin is warm and dry.  Psychiatric: She has [name] normal mood and affect. Her behavior is normal. Judgment and thought content normal.    ED Course   Medications  sodium chloride 0.9 % bolus 1,000 mL (not administered)  metoCLOPramide (REGLAN) injection 10 mg (not administered)   DIAGNOSTIC STUDIES: Oxygen Saturation is 99% on room air, normal by my  interpretation.    COORDINATION OF CARE: 9:06 PM-Discussed treatment plan which includes IV fluids, CXR, abdomen x-Willine Schwalbe, CBC panel and CMP with pt at bedside and pt agreed to plan.   Procedures (including critical care time)  Labs Reviewed - No data to display No results found. No diagnosis found.  MDM   Results for orders placed during the hospital encounter of 12/09/12  CBC WITH DIFFERENTIAL      Result Value Range   WBC 17.3 (*) 4.0 - 10.5 K/uL   RBC 5.45 (*) 3.87 - 5.11 MIL/uL   Hemoglobin 12.3  12.0 - 15.0 g/dL   HCT 84.6  96.2 - 95.2 %   MCV 73.6 (*) 78.0 - 100.0 fL   MCH 22.6 (*) 26.0 - 34.0 pg   MCHC 30.7  30.0 - 36.0 g/dL   RDW 84.1 (*) 32.4 - 40.1 %   Platelets 287  150 - 400 K/uL   Neutrophils Relative % 70  43 - 77 %   Lymphocytes Relative 22  12 - 46 %   Monocytes Relative 8  3 - 12 %   Eosinophils Relative 0  0 - 5 %   Basophils Relative 0  0 - 1 %   Neutro Abs 12.1 (*) 1.7 - 7.7 K/uL  Lymphs Abs 3.8  0.7 - 4.0 K/uL   Monocytes Absolute 1.4 (*) 0.1 - 1.0 K/uL   Eosinophils Absolute 0.0  0.0 - 0.7 K/uL   Basophils Absolute 0.0  0.0 - 0.1 K/uL   RBC Morphology POLYCHROMASIA PRESENT     WBC Morphology ATYPICAL LYMPHOCYTES    COMPREHENSIVE METABOLIC PANEL      Result Value Range   Sodium 133 (*) 135 - 145 mEq/L   Potassium 3.9  3.5 - 5.1 mEq/L   Chloride 94 (*) 96 - 112 mEq/L   CO2 24  19 - 32 mEq/L   Glucose, Bld 113 (*) 70 - 99 mg/dL   BUN 12  6 - 23 mg/dL   Creatinine, Ser 1.61  0.50 - 1.10 mg/dL   Calcium 9.4  8.4 - 09.6 mg/dL   Total Protein 8.1  6.0 - 8.3 g/dL   Albumin 3.9  3.5 - 5.2 g/dL   AST 15  0 - 37 U/L   ALT 9  0 - 35 U/L   Alkaline Phosphatase 52  39 - 117 U/L   Total Bilirubin 0.5  0.3 - 1.2 mg/dL   GFR calc non Af Amer 77 (*) >90 mL/min   GFR calc Af Amer 89 (*) >90 mL/min   No information on file. Dg Chest 2 View  12/09/2012   *RADIOLOGY REPORT*  Clinical Data: Cough.  Abdominal pain.  CHEST - 2 VIEW  Comparison: 10/02/2012   Findings: The heart size and pulmonary vascularity are normal.  The patient has prominent pericardial fat pads on the right and left.  There is slight peribronchial thickening consistent with bronchitis.  No infiltrates or effusions.  No acute osseous abnormality.  IMPRESSION: Slight bronchitic changes.   Original Report Authenticated By: Francene Boyers, M.D.   Dg Abd 1 View  12/09/2012   *RADIOLOGY REPORT*  Clinical Data: Abdominal pain.  ABDOMEN - 1 VIEW  Comparison: Radiographs dated 07/25/2010  Findings: Multiple surgical clips in the gallbladder fossa.  Air scattered throughout nondistended loops of large and small bowel. No fecal impaction.  No worrisome abdominal calcifications.  No acute osseous abnormality.  IMPRESSION: Benign-appearing abdomen.   Original Report Authenticated By: Francene Boyers, M.D.   Patient feels improved.  Abdomen is soft and mildly diffusely ttp.  She has no diarrhea here.  She is advised close follow up. She will be given rx for percocet for pain and advised oral rehydration.  Unclear etiology of leukocytosis at 17,000.  She has had [name] gradually increasing wbc with most recent at 17,000 in June.  She has intermittently been on steroids but is not now.  She is advised that this is abnormal and should have close follow up.    I personally performed the services described in this documentation, which was scribed in my presence. The recorded information has been reviewed and considered.   Sheila Quarry, MD 12/09/12 [phone]

## 2012-12-26 ENCOUNTER — Telehealth: Payer: Self-pay | Admitting: Family Medicine

## 2012-12-26 NOTE — Telephone Encounter (Addendum)
Percocet 5-325 mg 1 q4 hours prn pain Metformin 500 mg tab 1 BID #60

## 2012-12-26 NOTE — Telephone Encounter (Signed)
?   Ok to refill Percocet, last refill 12/09/12 by Dr. Rosalia Hammers 5-325mg  and refilled 11/26/12 by [place] 7.5-325mg . Printed out DEA sheet for you to check as well.

## 2012-12-27 MED ORDER — OXYCODONE-ACETAMINOPHEN 7.5-325 MG PO TABS: 1.0000 | ORAL_TABLET | Freq: Three times a day (TID) | ORAL | Status: DC | PRN

## 2012-12-27 MED ORDER — METFORMIN HCL 500 MG PO TABS: 500.0000 mg | ORAL_TABLET | Freq: Two times a day (BID) | ORAL | Status: DC

## 2012-12-27 NOTE — Telephone Encounter (Signed)
Okay to refill? 

## 2012-12-27 NOTE — Telephone Encounter (Signed)
Script Percocet ready for pickup, Metformin refilled

## 2012-12-31 ENCOUNTER — Other Ambulatory Visit: Payer: Self-pay | Admitting: Family Medicine

## 2012-12-31 ENCOUNTER — Ambulatory Visit (INDEPENDENT_AMBULATORY_CARE_PROVIDER_SITE_OTHER): Payer: Medicare PPO | Admitting: Family Medicine

## 2012-12-31 VITALS — BP 110/70 | HR 78 | Temp 98.4°F | Resp 18 | Wt 268.0 lb

## 2012-12-31 DIAGNOSIS — R39198 Other difficulties with micturition: Secondary | ICD-10-CM

## 2012-12-31 DIAGNOSIS — J449 Chronic obstructive pulmonary disease, unspecified: Secondary | ICD-10-CM

## 2012-12-31 DIAGNOSIS — E119 Type 2 diabetes mellitus without complications: Secondary | ICD-10-CM

## 2012-12-31 DIAGNOSIS — G629 Polyneuropathy, unspecified: Secondary | ICD-10-CM

## 2012-12-31 DIAGNOSIS — G609 Hereditary and idiopathic neuropathy, unspecified: Secondary | ICD-10-CM

## 2012-12-31 DIAGNOSIS — D72829 Elevated white blood cell count, unspecified: Secondary | ICD-10-CM | POA: Insufficient documentation

## 2012-12-31 DIAGNOSIS — Z23 Encounter for immunization: Secondary | ICD-10-CM

## 2012-12-31 DIAGNOSIS — R3989 Other symptoms and signs involving the genitourinary system: Secondary | ICD-10-CM

## 2012-12-31 DIAGNOSIS — I10 Essential (primary) hypertension: Secondary | ICD-10-CM

## 2012-12-31 LAB — URINALYSIS, ROUTINE W REFLEX MICROSCOPIC
Glucose, UA: NEGATIVE mg/dL
Hgb urine dipstick: NEGATIVE
Ketones, ur: NEGATIVE mg/dL
Leukocytes, UA: NEGATIVE
Nitrite: NEGATIVE
Protein, ur: NEGATIVE mg/dL
Specific Gravity, Urine: >1.03 — ABNORMAL HIGH (ref 1.005–1.030)
Urobilinogen, UA: 0.2 mg/dL (ref 0.0–1.0)
pH: 6 (ref 5.0–8.0)

## 2012-12-31 MED ORDER — PREGABALIN 75 MG PO CAPS: 75.0000 mg | ORAL_CAPSULE | Freq: Two times a day (BID) | ORAL | Status: DC

## 2012-12-31 MED ORDER — VALSARTAN 80 MG PO TABS: 40.0000 mg | ORAL_TABLET | Freq: Every day | ORAL | Status: DC

## 2012-12-31 NOTE — Patient Instructions (Addendum)
Continue current medications Referral back to pulmonary for lungs I will call with lab results Decrease gabapentin to 600mg  Three times a day, for 2 weeks, then 1 tablet three times 1 week,  Then 1 tablet twice a day 1 week, 1 tablet at bedtime only for 1 week then stop  We will send Lyrica in  Increase water intake Flu shot given  Call if you continue to have urinary problems  F/U 3 months

## 2013-01-01 LAB — CBC WITH DIFFERENTIAL/PLATELET
Basophils Absolute: 0.1 K/uL (ref 0.0–0.1)
Basophils Relative: 1 % (ref 0–1)
Eosinophils Absolute: 0.1 K/uL (ref 0.0–0.7)
Eosinophils Relative: 1 % (ref 0–5)
HCT: 36.3 % (ref 36.0–46.0)
Hemoglobin: 11.5 g/dL — ABNORMAL LOW (ref 12.0–15.0)
Lymphocytes Relative: 31 % (ref 12–46)
Lymphs Abs: 3.2 K/uL (ref 0.7–4.0)
MCH: 22.1 pg — ABNORMAL LOW (ref 26.0–34.0)
MCHC: 31.7 g/dL (ref 30.0–36.0)
MCV: 69.7 fL — ABNORMAL LOW (ref 78.0–100.0)
Monocytes Absolute: 0.9 K/uL (ref 0.1–1.0)
Monocytes Relative: 8 % (ref 3–12)
Neutro Abs: 6.1 K/uL (ref 1.7–7.7)
Neutrophils Relative %: 59 % (ref 43–77)
Platelets: 339 K/uL (ref 150–400)
RBC: 5.21 MIL/uL — ABNORMAL HIGH (ref 3.87–5.11)
RDW: 17.4 % — ABNORMAL HIGH (ref 11.5–15.5)
WBC: 10.4 K/uL (ref 4.0–10.5)

## 2013-01-01 LAB — LIPID PANEL
Cholesterol: 166 mg/dL (ref 0–200)
HDL: 41 mg/dL (ref >39)
LDL Cholesterol: 96 mg/dL (ref 0–99)
Total CHOL/HDL Ratio: 4 Ratio
Triglycerides: 145 mg/dL (ref <150)
VLDL: 29 mg/dL (ref 0–40)

## 2013-01-01 LAB — COMPREHENSIVE METABOLIC PANEL
ALT: 10 U/L (ref 0–35)
AST: 14 U/L (ref 0–37)
Albumin: 3.8 g/dL (ref 3.5–5.2)
Alkaline Phosphatase: 52 U/L (ref 39–117)
BUN: 10 mg/dL (ref 6–23)
CO2: 31 mEq/L (ref 19–32)
Calcium: 9.1 mg/dL (ref 8.4–10.5)
Chloride: 99 mEq/L (ref 96–112)
Creat: 0.67 mg/dL (ref 0.50–1.10)
Glucose, Bld: 106 mg/dL — ABNORMAL HIGH (ref 70–99)
Potassium: 4.5 mEq/L (ref 3.5–5.3)
Sodium: 135 mEq/L (ref 135–145)
Total Bilirubin: 0.5 mg/dL (ref 0.3–1.2)
Total Protein: 6.7 g/dL (ref 6.0–8.3)

## 2013-01-01 LAB — HEMOGLOBIN A1C
Hgb A1c MFr Bld: 6.6 % — ABNORMAL HIGH (ref <5.7)
Mean Plasma Glucose: 143 mg/dL — ABNORMAL HIGH (ref <117)

## 2013-01-01 LAB — MICROALBUMIN / CREATININE URINE RATIO
Creatinine, Urine: 298.6 mg/dL
Microalb Creat Ratio: 4.5 mg/g (ref 0.0–30.0)
Microalb, Ur: 1.35 mg/dL (ref 0.00–1.89)

## 2013-01-02 ENCOUNTER — Encounter: Payer: Self-pay | Admitting: Family Medicine

## 2013-01-02 NOTE — Assessment & Plan Note (Signed)
I will refer her back pulmonary she is very severe COPD.

## 2013-01-02 NOTE — Assessment & Plan Note (Signed)
Check A1C on metformin Urine Micro sent

## 2013-01-02 NOTE — Progress Notes (Signed)
  Subjective:    Patient ID: [name], female    DOB: [birth year], 47 y.o.   MRN: [id number]  HPI  Patient here to followup chronic medical problems. She now has disability and is on Medicare. The diabetes mellitus her blood sugars fasting have been 130 to 140s. She's due for A1c. Elevated white blood cell count- was in the ER recently for viral illnessand was advised to have her WBCs rechecked as they've been elevated over the past 2 visits. She's not had any prednisone recently. Neuropathy she states in the gabapentin is not helping she is taking 600 mg twice a day and 900 mg at night with little relief in her neuropathy. Over the past week she's had some urinary pressure and difficulty urinating. States that she has ago with very small amounts comes out. She denies any abdominal pain or dysuria. She does state she has not been drinking very much and she was in the heat or last was diagnosed with dehydration with a viral illness   Review of Systems  GEN- +fatigue, fever, weight loss,weakness, recent illness HEENT- denies eye drainage, change in vision, nasal discharge, CVS- denies chest pain, palpitations RESP- denies SOB, +cough, wheeze ABD- denies N/V, change in stools, abd pain GU- denies dysuria, hematuria, dribbling, incontinence MSK- + joint pain, muscle aches, injury Neuro- denies headache, dizziness, syncope, seizure activity      Objective:   Physical Exam GEN- NAD, alert and oriented x3- Room Air , HEENT- PERRL, EOMI, non injected sclera, pink conjunctiva, MMM, oropharynx clear Neck- Supple, no LAD CVS- RRR, no murmur RESP- few scattered wheeze, normal WOB, otherwise clear ABD-NABS,soft,NT,ND, no suprapubic tenderness GU- urethra normal position, I and O cath total 100cc urine, very dark urine, no odor EXT- No edema Pulses- Radial 2+         Assessment & Plan:

## 2013-01-02 NOTE — Assessment & Plan Note (Signed)
Well controlled, meds refilled Take 1/2 tablet of diovan

## 2013-01-02 NOTE — Assessment & Plan Note (Signed)
Will plan to start her on Lyrica. For now we have given her a taper for the gabapentin.

## 2013-01-02 NOTE — Assessment & Plan Note (Signed)
UA neg, no sign of urinary retention. It looks like she does not drink enough fluids.

## 2013-01-02 NOTE — Assessment & Plan Note (Signed)
Repeat CBC with differential today. In the past her white blood cell cells have been elevated she was always admitted on high-dose prednisone.

## 2013-01-03 LAB — FOLATE: Folate: >20 ng/mL

## 2013-01-03 LAB — IRON AND TIBC
%SAT: 7 % — ABNORMAL LOW (ref 20–55)
Iron: 25 ug/dL — ABNORMAL LOW (ref 42–145)
TIBC: 379 ug/dL (ref 250–470)
UIBC: 354 ug/dL (ref 125–400)

## 2013-01-03 LAB — FERRITIN: Ferritin: 30 ng/mL (ref 10–291)

## 2013-01-03 LAB — VITAMIN B12: Vitamin B-12: 425 pg/mL (ref 211–911)

## 2013-01-10 ENCOUNTER — Emergency Department (HOSPITAL_COMMUNITY): Payer: Medicare PPO

## 2013-01-10 ENCOUNTER — Encounter (HOSPITAL_COMMUNITY): Payer: Self-pay | Admitting: *Deleted

## 2013-01-10 ENCOUNTER — Inpatient Hospital Stay (HOSPITAL_COMMUNITY)
Admission: EM | Admit: 2013-01-10 | Discharge: 2013-01-20 | DRG: 177 | Disposition: A | Discharge status: Home Health | Payer: Medicare PPO | Attending: Internal Medicine | Admitting: Internal Medicine

## 2013-01-10 ENCOUNTER — Other Ambulatory Visit: Payer: Self-pay

## 2013-01-10 DIAGNOSIS — F329 Major depressive disorder, single episode, unspecified: Secondary | ICD-10-CM | POA: Diagnosis present

## 2013-01-10 DIAGNOSIS — I509 Heart failure, unspecified: Secondary | ICD-10-CM | POA: Diagnosis present

## 2013-01-10 DIAGNOSIS — J441 Chronic obstructive pulmonary disease with (acute) exacerbation: Secondary | ICD-10-CM | POA: Diagnosis present

## 2013-01-10 DIAGNOSIS — F32A Depression, unspecified: Secondary | ICD-10-CM | POA: Diagnosis present

## 2013-01-10 DIAGNOSIS — T380X5A Adverse effect of glucocorticoids and synthetic analogues, initial encounter: Secondary | ICD-10-CM | POA: Diagnosis present

## 2013-01-10 DIAGNOSIS — Z720 Tobacco use: Secondary | ICD-10-CM

## 2013-01-10 DIAGNOSIS — E1142 Type 2 diabetes mellitus with diabetic polyneuropathy: Secondary | ICD-10-CM | POA: Diagnosis present

## 2013-01-10 DIAGNOSIS — F3289 Other specified depressive episodes: Secondary | ICD-10-CM | POA: Diagnosis present

## 2013-01-10 DIAGNOSIS — D649 Anemia, unspecified: Secondary | ICD-10-CM | POA: Diagnosis present

## 2013-01-10 DIAGNOSIS — F17200 Nicotine dependence, unspecified, uncomplicated: Secondary | ICD-10-CM

## 2013-01-10 DIAGNOSIS — J15212 Pneumonia due to Methicillin resistant Staphylococcus aureus: Secondary | ICD-10-CM

## 2013-01-10 DIAGNOSIS — Z9981 Dependence on supplemental oxygen: Secondary | ICD-10-CM

## 2013-01-10 DIAGNOSIS — Z6841 Body Mass Index (BMI) 40.0 and over, adult: Secondary | ICD-10-CM

## 2013-01-10 DIAGNOSIS — F17210 Nicotine dependence, cigarettes, uncomplicated: Secondary | ICD-10-CM | POA: Diagnosis present

## 2013-01-10 DIAGNOSIS — J189 Pneumonia, unspecified organism: Secondary | ICD-10-CM

## 2013-01-10 DIAGNOSIS — E1159 Type 2 diabetes mellitus with other circulatory complications: Secondary | ICD-10-CM | POA: Diagnosis present

## 2013-01-10 DIAGNOSIS — J9610 Chronic respiratory failure, unspecified whether with hypoxia or hypercapnia: Secondary | ICD-10-CM

## 2013-01-10 DIAGNOSIS — E875 Hyperkalemia: Secondary | ICD-10-CM

## 2013-01-10 DIAGNOSIS — G629 Polyneuropathy, unspecified: Secondary | ICD-10-CM | POA: Diagnosis present

## 2013-01-10 DIAGNOSIS — M129 Arthropathy, unspecified: Secondary | ICD-10-CM | POA: Diagnosis present

## 2013-01-10 DIAGNOSIS — I10 Essential (primary) hypertension: Secondary | ICD-10-CM

## 2013-01-10 DIAGNOSIS — F411 Generalized anxiety disorder: Secondary | ICD-10-CM | POA: Diagnosis present

## 2013-01-10 DIAGNOSIS — J9620 Acute and chronic respiratory failure, unspecified whether with hypoxia or hypercapnia: Secondary | ICD-10-CM | POA: Diagnosis present

## 2013-01-10 DIAGNOSIS — E119 Type 2 diabetes mellitus without complications: Secondary | ICD-10-CM

## 2013-01-10 DIAGNOSIS — K219 Gastro-esophageal reflux disease without esophagitis: Secondary | ICD-10-CM | POA: Diagnosis present

## 2013-01-10 DIAGNOSIS — E1149 Type 2 diabetes mellitus with other diabetic neurological complication: Secondary | ICD-10-CM | POA: Diagnosis present

## 2013-01-10 LAB — GLUCOSE, CAPILLARY
Glucose-Capillary: 178 mg/dL — ABNORMAL HIGH (ref 70–99)
Glucose-Capillary: 200 mg/dL — ABNORMAL HIGH (ref 70–99)

## 2013-01-10 LAB — COMPREHENSIVE METABOLIC PANEL
ALT: 11 U/L (ref 0–35)
AST: 16 U/L (ref 0–37)
Albumin: 3.5 g/dL (ref 3.5–5.2)
Alkaline Phosphatase: 51 U/L (ref 39–117)
BUN: 5 mg/dL — ABNORMAL LOW (ref 6–23)
CO2: 28 mEq/L (ref 19–32)
Calcium: 9.1 mg/dL (ref 8.4–10.5)
Chloride: 96 mEq/L (ref 96–112)
Creatinine, Ser: 0.66 mg/dL (ref 0.50–1.10)
GFR calc Af Amer: >90 mL/min (ref >90)
GFR calc non Af Amer: >90 mL/min (ref >90)
Glucose, Bld: 175 mg/dL — ABNORMAL HIGH (ref 70–99)
Potassium: 3.2 mEq/L — ABNORMAL LOW (ref 3.5–5.1)
Sodium: 134 mEq/L — ABNORMAL LOW (ref 135–145)
Total Bilirubin: 0.7 mg/dL (ref 0.3–1.2)
Total Protein: 7.3 g/dL (ref 6.0–8.3)

## 2013-01-10 LAB — CBC WITH DIFFERENTIAL/PLATELET
Basophils Absolute: 0 10*3/uL (ref 0.0–0.1)
Basophils Relative: 0 % (ref 0–1)
Eosinophils Absolute: 0 10*3/uL (ref 0.0–0.7)
Eosinophils Relative: 0 % (ref 0–5)
HCT: 34.7 % — ABNORMAL LOW (ref 36.0–46.0)
Hemoglobin: 10.4 g/dL — ABNORMAL LOW (ref 12.0–15.0)
Lymphocytes Relative: 3 % — ABNORMAL LOW (ref 12–46)
Lymphs Abs: 0.6 10*3/uL — ABNORMAL LOW (ref 0.7–4.0)
MCH: 22.2 pg — ABNORMAL LOW (ref 26.0–34.0)
MCHC: 30 g/dL (ref 30.0–36.0)
MCV: 74.1 fL — ABNORMAL LOW (ref 78.0–100.0)
Monocytes Absolute: 0.3 10*3/uL (ref 0.1–1.0)
Monocytes Relative: 2 % — ABNORMAL LOW (ref 3–12)
Neutro Abs: 17.4 10*3/uL — ABNORMAL HIGH (ref 1.7–7.7)
Neutrophils Relative %: 95 % — ABNORMAL HIGH (ref 43–77)
Platelets: 246 10*3/uL (ref 150–400)
RBC: 4.68 MIL/uL (ref 3.87–5.11)
RDW: 17.5 % — ABNORMAL HIGH (ref 11.5–15.5)
WBC: 18.3 10*3/uL — ABNORMAL HIGH (ref 4.0–10.5)

## 2013-01-10 LAB — HEMOGLOBIN A1C
Hgb A1c MFr Bld: 6.7 % — ABNORMAL HIGH (ref <5.7)
Mean Plasma Glucose: 146 mg/dL — ABNORMAL HIGH (ref <117)

## 2013-01-10 LAB — PRO B NATRIURETIC PEPTIDE: Pro B Natriuretic peptide (BNP): 1235 pg/mL — ABNORMAL HIGH (ref 0–125)

## 2013-01-10 LAB — LACTIC ACID, PLASMA: Lactic Acid, Venous: 1.7 mmol/L (ref 0.5–2.2)

## 2013-01-10 LAB — TROPONIN I: Troponin I: <0.3 ng/mL (ref <0.30)

## 2013-01-10 MED ORDER — PREGABALIN 75 MG PO CAPS
75.0000 mg | ORAL_CAPSULE | Freq: Two times a day (BID) | ORAL | Status: DC
  Administered 2013-01-10 – 2013-01-20 (×20): 75 mg via ORAL
  Filled 2013-01-10 (×20): qty 1

## 2013-01-10 MED ORDER — PANTOPRAZOLE SODIUM 40 MG PO TBEC
40.0000 mg | DELAYED_RELEASE_TABLET | Freq: Every day | ORAL | Status: DC
  Administered 2013-01-10 – 2013-01-20 (×11): 40 mg via ORAL
  Filled 2013-01-10 (×11): qty 1

## 2013-01-10 MED ORDER — ALBUTEROL SULFATE (5 MG/ML) 0.5% IN NEBU
5.0000 mg | INHALATION_SOLUTION | Freq: Once | RESPIRATORY_TRACT | Status: AC
  Administered 2013-01-10: 5 mg via RESPIRATORY_TRACT
  Filled 2013-01-10: qty 1

## 2013-01-10 MED ORDER — DEXTROSE 5 % IV SOLN
500.0000 mg | INTRAVENOUS | Status: DC
  Administered 2013-01-10 – 2013-01-13 (×4): 500 mg via INTRAVENOUS
  Filled 2013-01-10 (×5): qty 500

## 2013-01-10 MED ORDER — INSULIN ASPART 100 UNIT/ML ~~LOC~~ SOLN: 0.0000 [IU] | Freq: Three times a day (TID) | SUBCUTANEOUS | Status: DC

## 2013-01-10 MED ORDER — NICOTINE 21 MG/24HR TD PT24
21.0000 mg | MEDICATED_PATCH | Freq: Every day | TRANSDERMAL | Status: DC
  Administered 2013-01-10 – 2013-01-20 (×11): 21 mg via TRANSDERMAL
  Filled 2013-01-10 (×11): qty 1

## 2013-01-10 MED ORDER — ALBUTEROL SULFATE (5 MG/ML) 0.5% IN NEBU
2.5000 mg | INHALATION_SOLUTION | Freq: Four times a day (QID) | RESPIRATORY_TRACT | Status: DC | PRN
  Administered 2013-01-11: 2.5 mg via RESPIRATORY_TRACT
  Filled 2013-01-10: qty 0.5

## 2013-01-10 MED ORDER — GUAIFENESIN-DM 100-10 MG/5ML PO SYRP
5.0000 mL | ORAL_SOLUTION | ORAL | Status: DC | PRN
  Administered 2013-01-10 – 2013-01-13 (×9): 5 mL via ORAL
  Filled 2013-01-10 (×9): qty 5

## 2013-01-10 MED ORDER — ALBUTEROL SULFATE (5 MG/ML) 0.5% IN NEBU: 2.5000 mg | INHALATION_SOLUTION | Freq: Four times a day (QID) | RESPIRATORY_TRACT | Status: DC

## 2013-01-10 MED ORDER — INSULIN ASPART 100 UNIT/ML ~~LOC~~ SOLN
0.0000 [IU] | Freq: Every day | SUBCUTANEOUS | Status: DC
  Administered 2013-01-12 – 2013-01-13 (×2): 2 [IU] via SUBCUTANEOUS
  Administered 2013-01-15 – 2013-01-16 (×2): 3 [IU] via SUBCUTANEOUS

## 2013-01-10 MED ORDER — INSULIN GLARGINE 100 UNIT/ML ~~LOC~~ SOLN
5.0000 [IU] | Freq: Every day | SUBCUTANEOUS | Status: DC
  Administered 2013-01-10 – 2013-01-16 (×7): 5 [IU] via SUBCUTANEOUS
  Filled 2013-01-10 (×10): qty 0.05

## 2013-01-10 MED ORDER — METFORMIN HCL 500 MG PO TABS
500.0000 mg | ORAL_TABLET | Freq: Two times a day (BID) | ORAL | Status: DC
  Filled 2013-01-10: qty 1

## 2013-01-10 MED ORDER — ALBUTEROL SULFATE (5 MG/ML) 0.5% IN NEBU
2.5000 mg | INHALATION_SOLUTION | Freq: Four times a day (QID) | RESPIRATORY_TRACT | Status: DC
  Administered 2013-01-10 – 2013-01-11 (×2): 2.5 mg via RESPIRATORY_TRACT
  Filled 2013-01-10 (×2): qty 0.5

## 2013-01-10 MED ORDER — OXYCODONE-ACETAMINOPHEN 5-325 MG PO TABS
1.0000 | ORAL_TABLET | Freq: Once | ORAL | Status: AC
  Administered 2013-01-10: 1 via ORAL
  Filled 2013-01-10: qty 1

## 2013-01-10 MED ORDER — LEVOFLOXACIN IN D5W 750 MG/150ML IV SOLN
750.0000 mg | Freq: Once | INTRAVENOUS | Status: AC
  Administered 2013-01-10: 750 mg via INTRAVENOUS
  Filled 2013-01-10: qty 150

## 2013-01-10 MED ORDER — SODIUM CHLORIDE 0.9 % IV SOLN: INTRAVENOUS | Status: AC

## 2013-01-10 MED ORDER — INSULIN ASPART 100 UNIT/ML ~~LOC~~ SOLN
0.0000 [IU] | Freq: Three times a day (TID) | SUBCUTANEOUS | Status: DC
Start: 2013-01-10 — End: 2013-01-17
  Administered 2013-01-10: 2 [IU] via SUBCUTANEOUS
  Administered 2013-01-11 – 2013-01-12 (×2): 1 [IU] via SUBCUTANEOUS
  Administered 2013-01-12: 2 [IU] via SUBCUTANEOUS
  Administered 2013-01-12: 1 [IU] via SUBCUTANEOUS
  Administered 2013-01-13 – 2013-01-14 (×5): 2 [IU] via SUBCUTANEOUS
  Administered 2013-01-14: 1 [IU] via SUBCUTANEOUS
  Administered 2013-01-15: 3 [IU] via SUBCUTANEOUS
  Administered 2013-01-15: 1 [IU] via SUBCUTANEOUS
  Administered 2013-01-16 (×2): 3 [IU] via SUBCUTANEOUS
  Administered 2013-01-16 – 2013-01-17 (×2): 2 [IU] via SUBCUTANEOUS
  Administered 2013-01-17: 3 [IU] via SUBCUTANEOUS

## 2013-01-10 MED ORDER — ENOXAPARIN SODIUM 40 MG/0.4ML ~~LOC~~ SOLN
40.0000 mg | SUBCUTANEOUS | Status: DC
  Administered 2013-01-10 – 2013-01-19 (×10): 40 mg via SUBCUTANEOUS
  Filled 2013-01-10 (×10): qty 0.4

## 2013-01-10 MED ORDER — IPRATROPIUM BROMIDE 0.02 % IN SOLN
0.5000 mg | Freq: Once | RESPIRATORY_TRACT | Status: AC
  Administered 2013-01-10: 0.5 mg via RESPIRATORY_TRACT
  Filled 2013-01-10: qty 2.5

## 2013-01-10 MED ORDER — IRBESARTAN 75 MG PO TABS
37.5000 mg | ORAL_TABLET | Freq: Every day | ORAL | Status: DC
  Administered 2013-01-10 – 2013-01-18 (×9): 37.5 mg via ORAL
  Filled 2013-01-10 (×9): qty 1

## 2013-01-10 MED ORDER — IBUPROFEN 400 MG PO TABS
200.0000 mg | ORAL_TABLET | Freq: Four times a day (QID) | ORAL | Status: DC | PRN
  Filled 2013-01-10: qty 1

## 2013-01-10 MED ORDER — IPRATROPIUM BROMIDE 0.02 % IN SOLN
0.5000 mg | Freq: Four times a day (QID) | RESPIRATORY_TRACT | Status: DC
  Administered 2013-01-10 – 2013-01-11 (×2): 0.5 mg via RESPIRATORY_TRACT
  Filled 2013-01-10 (×2): qty 2.5

## 2013-01-10 MED ORDER — SODIUM CHLORIDE 0.9 % IJ SOLN: 3.0000 mL | INTRAMUSCULAR | Status: DC | PRN

## 2013-01-10 MED ORDER — SODIUM CHLORIDE 0.9 % IJ SOLN
3.0000 mL | Freq: Two times a day (BID) | INTRAMUSCULAR | Status: DC
  Administered 2013-01-13 – 2013-01-20 (×9): 3 mL via INTRAVENOUS

## 2013-01-10 MED ORDER — CITALOPRAM HYDROBROMIDE 20 MG PO TABS
40.0000 mg | ORAL_TABLET | Freq: Every morning | ORAL | Status: DC
  Administered 2013-01-11 – 2013-01-20 (×10): 40 mg via ORAL
  Filled 2013-01-10 (×10): qty 2

## 2013-01-10 MED ORDER — SODIUM CHLORIDE 0.9 % IV SOLN
250.0000 mL | INTRAVENOUS | Status: DC | PRN
  Administered 2013-01-10: 250 mL via INTRAVENOUS

## 2013-01-10 MED ORDER — IOHEXOL 300 MG/ML  SOLN
80.0000 mL | Freq: Once | INTRAMUSCULAR | Status: AC | PRN
  Administered 2013-01-10: 80 mL via INTRAVENOUS

## 2013-01-10 MED ORDER — MORPHINE SULFATE 2 MG/ML IJ SOLN
1.0000 mg | INTRAMUSCULAR | Status: DC | PRN
  Administered 2013-01-10 – 2013-01-18 (×44): 1 mg via INTRAVENOUS
  Filled 2013-01-10 (×44): qty 1

## 2013-01-10 MED ORDER — DEXTROSE 5 % IV SOLN
1.0000 g | INTRAVENOUS | Status: DC
  Administered 2013-01-10 – 2013-01-13 (×4): 1 g via INTRAVENOUS
  Filled 2013-01-10 (×4): qty 10

## 2013-01-10 MED ORDER — OXYCODONE-ACETAMINOPHEN 5-325 MG PO TABS
1.5000 | ORAL_TABLET | Freq: Three times a day (TID) | ORAL | Status: DC | PRN
  Administered 2013-01-11 – 2013-01-14 (×10): 1.5 via ORAL
  Filled 2013-01-10 (×10): qty 2

## 2013-01-10 MED ORDER — METHYLPREDNISOLONE SODIUM SUCC 125 MG IJ SOLR
60.0000 mg | Freq: Two times a day (BID) | INTRAMUSCULAR | Status: DC
  Administered 2013-01-10 – 2013-01-12 (×3): 60 mg via INTRAVENOUS
  Filled 2013-01-10 (×3): qty 2

## 2013-01-10 NOTE — ED Notes (Signed)
Report called to [facility] room 320

## 2013-01-10 NOTE — ED Notes (Addendum)
Coughing "for a few days" and got short of breath last night.  Hx of COPD.  Expiratory wheezing and SpO2 of 85% on EMS arrival.  Multiple [facility] and neb treatments at home.  EMS gave 5 albuterol and .5 atrovent.  125mg  solumedrol given in route.  C/O chest discomfort described as soreness.

## 2013-01-10 NOTE — ED Notes (Signed)
RT called and responded for breathing treatment

## 2013-01-10 NOTE — ED Provider Notes (Signed)
CSN: [id number]     Arrival date & time 01/10/13  1159 History   First MD Initiated Contact with Patient 01/10/13 1305     Chief Complaint  Patient presents with  . Shortness of Breath   (Consider location/radiation/quality/duration/timing/severity/associated sxs/prior Treatment) Patient is a 47 y.o. female presenting with shortness of breath. The history is provided by the patient (the pt complains of sob and cough and fever).  Shortness of Breath Severity:  Moderate Onset quality:  Sudden Timing:  Constant Progression:  Waxing and waning Chronicity:  New Context: activity   Associated symptoms: cough   Associated symptoms: no abdominal pain, no chest pain, no headaches and no rash     Past Medical History  Diagnosis Date  . Diabetes mellitus   . COPD (chronic obstructive pulmonary disease)   . HTN (hypertension)   . Low back pain   . Tachycardia     never had test done since no insurance  . Depression   . Asthma   . Shortness of breath   . Anxiety   . Gastric erosions     EGD 08/2010.  . Internal hemorrhoids     Colonoscopy 5/12.  [name] Heavy menses   . Chronic respiratory failure with hypoxia     On 2-3 L of oxygen at home  . GERD (gastroesophageal reflux disease)   . Anemia   . Arthritis    Past Surgical History  Procedure Laterality Date  . Cholecystectomy  1990  . Cesarean section      twice  . Kidney surgery      as child for blockages  . Tubal ligation    . Tonsillectomy    . Wrist surgery  1995    Lt wrist  . Tympanostomy tube placement    . Hysteroscopy with thermachoice  01/17/2012    Procedure: HYSTEROSCOPY WITH THERMACHOICE;  Surgeon: Lazaro Arms, MD;  Location: [facility];  Service: Gynecology;  Laterality: N/A;  total therapy time: 9:13sec  D5W  18 ml in, D5W   18ml out, temperture 87degrees celcious  . Uterine ablation     Family History  Problem Relation Age of Onset  . Heart attack Father 30    deceased, etoh use  . Heart disease Father   .  Heart attack Mother 65    deceased  . Diabetes Mother   . Breast cancer Mother   . Heart failure Mother     oxygen dependence, nonsmoker  . Heart disease Mother   . Depression Mother   . Cancer Mother   . Colon cancer Neg Hx   . Liver disease Maternal Aunt 40    died while on liver transplant list  . Heart attack Maternal Grandmother     premature CAD  . Ulcers Sister   . Hypertension Sister    History  Substance Use Topics  . Smoking status: Current Every Day Smoker -- 0.50 packs/day for 30 years    Types: Cigarettes  . Smokeless tobacco: Never Used  . Alcohol Use: Yes     Comment: social use   OB History   Grav Para Term Preterm Abortions TAB SAB Ect Mult Living                 Review of Systems  Constitutional: Negative for appetite change and fatigue.  HENT: Negative for congestion, sinus pressure and ear discharge.   Eyes: Negative for discharge.  Respiratory: Positive for cough and shortness of breath.   Cardiovascular: Negative  for chest pain.  Gastrointestinal: Negative for abdominal pain and diarrhea.  Genitourinary: Negative for frequency and hematuria.  Musculoskeletal: Negative for back pain.  Skin: Negative for rash.  Neurological: Negative for seizures and headaches.  Psychiatric/Behavioral: Negative for hallucinations.    Allergies  Codeine and Wellbutrin  Home Medications   Current Outpatient Rx  Name  Route  Sig  Dispense  Refill  . albuterol (PROVENTIL HFA) 108 (90 BASE) MCG/ACT inhaler   Inhalation   Inhale 2 puffs into the lungs every 4 (four) hours as needed for wheezing or shortness of breath. Wheezing/shortnes of breath   1 Inhaler   3   . albuterol (PROVENTIL) (5 MG/ML) 0.5% nebulizer solution   Nebulization   Take 2.5 mg by nebulization every 6 (six) hours as needed for wheezing.         . citalopram (CELEXA) 40 MG tablet   Oral   Take 1 tablet (40 mg total) by mouth every morning.   30 tablet   6   . gabapentin (NEURONTIN)  300 MG capsule   Oral   Take 300-600 mg by mouth 3 (three) times daily. Take 1 tab twice daily  and 2 at bedtime         . ibuprofen (ADVIL,MOTRIN) 200 MG tablet   Oral   Take 200-400 mg by mouth every 6 (six) hours as needed for pain. For pain         . loperamide (IMODIUM A-D) 2 MG tablet   Oral   Take 2 mg by mouth daily as needed for diarrhea or loose stools.         . metFORMIN (GLUCOPHAGE) 500 MG tablet   Oral   Take 1 tablet (500 mg total) by mouth 2 (two) times daily with a meal.   60 tablet   3   . mometasone-formoterol (DULERA) 100-5 MCG/ACT AERO   Inhalation   Inhale 2 puffs into the lungs 2 (two) times daily as needed. For shortness of breath         . omeprazole (PRILOSEC) 20 MG capsule   Oral   Take 20 mg by mouth every morning.         [name] oxyCODONE-acetaminophen (PERCOCET) 7.5-325 MG per tablet   Oral   Take 1 tablet by mouth 3 (three) times daily as needed for pain.   90 tablet   0   . Pseudoephedrine HCl (PSEUDO PO)   Oral   Take 2 tablets by mouth daily as needed (congestion).         . Pseudoephedrine-Guaifenesin (MUCINEX D) [phone] MG TB12   Oral   Take 1 tablet by mouth daily as needed (cough).         . valsartan (DIOVAN) 80 MG tablet   Oral   Take 0.5 tablets (40 mg total) by mouth daily.   30 tablet   6   . pregabalin (LYRICA) 75 MG capsule   Oral   Take 1 capsule (75 mg total) by mouth 2 (two) times daily.   60 capsule   3    BP 145/58  Pulse 97  Temp(Src) 99.5 F (37.5 C) (Oral)  Resp 20  Ht 5\' 4"  (1.626 m)  Wt 268 lb (121.564 kg)  BMI 45.98 kg/m2  SpO2 89% Physical Exam  Constitutional: She is oriented to person, place, and time. She appears well-developed.  HENT:  Head: Normocephalic.  Eyes: Conjunctivae and EOM are normal. No scleral icterus.  Neck: Neck supple. No thyromegaly  present.  Cardiovascular: Normal rate and regular rhythm.  Exam reveals no gallop and no friction rub.   No murmur  heard. Pulmonary/Chest: No stridor. She has wheezes. She has no rales. She exhibits no tenderness.  Abdominal: She exhibits no distension. There is no tenderness. There is no rebound.  Musculoskeletal: Normal range of motion. She exhibits no edema.  Lymphadenopathy:    She has no cervical adenopathy.  Neurological: She is oriented to person, place, and time. Coordination normal.  Skin: No rash noted. No erythema.  Psychiatric: She has a normal mood and affect. Her behavior is normal.    ED Course  Procedures (including critical care time) Labs Review Labs Reviewed  CBC WITH DIFFERENTIAL - Abnormal; Notable for the following:    WBC 18.3 (*)    Hemoglobin 10.4 (*)    HCT 34.7 (*)    MCV 74.1 (*)    MCH 22.2 (*)    RDW 17.5 (*)    Neutrophils Relative % 95 (*)    Neutro Abs 17.4 (*)    Lymphocytes Relative 3 (*)    Lymphs Abs 0.6 (*)    Monocytes Relative 2 (*)    All other components within normal limits  COMPREHENSIVE METABOLIC PANEL - Abnormal; Notable for the following:    Sodium 134 (*)    Potassium 3.2 (*)    Glucose, Bld 175 (*)    BUN 5 (*)    All other components within normal limits  PRO B NATRIURETIC PEPTIDE - Abnormal; Notable for the following:    Pro B Natriuretic peptide (BNP) 1235.0 (*)    All other components within normal limits  TROPONIN I  LACTIC ACID, PLASMA   Imaging Review Ct Chest W Contrast  01/10/2013   CLINICAL DATA:  Shortness of breast.  EXAM: CT CHEST WITH CONTRAST  TECHNIQUE: Multidetector CT imaging of the chest was performed during intravenous contrast administration.  CONTRAST:  80mL OMNIPAQUE IOHEXOL 300 MG/ML  SOLN  COMPARISON:  02/17/2012  FINDINGS: There is no pleural effusion identified. There are patchy, bilateral areas of airspace consolidation and subsegmental atelectasis involving both lungs. This is upper lobe predominant and the left upper lobe is most severely affected. There is no interstitial edema identified.  The trachea  appears patent and is midline. No endobronchial lesion identified. The heart size appears normal. There is no pericardial effusion. Multiple small nonspecific pulmonary nodules are identified within the mediastinum and hilar regions. No axillary or supraclavicular adenopathy identified.  Incidental imaging through the upper abdomen shows cholecystectomy clips.  Review of the visualized osseous structures is significant for mild thoracic spondylosis.  IMPRESSION: 1. Multifocal, bilateral areas of atelectasis and airspace consolidation. Findings are likely secondary to multifocal infection.  2. No pleural effusion or interstitial edema identified. .  3. Prior cholecystectomy.   Electronically Signed   By: Signa Kell M.D.   On: 01/10/2013 15:44   Dg Chest Port 1 View  01/10/2013   CLINICAL DATA:  Cough. Congestion. Shortness breath. COPD.  EXAM: PORTABLE CHEST - 1 VIEW  COMPARISON:  12/09/2012.  FINDINGS: Cardiomegaly. Pulmonary edema suspected. Infectious infiltrate not excluded. No gross pneumothorax.  Follow up two view examination with cardiac leads removed may prove helpful.  IMPRESSION: Cardiomegaly. Pulmonary edema suspected. Infectious infiltrate not excluded.  Please see above.   Electronically Signed   By: Bridgett Larsson   On: 01/10/2013 13:44   Date: 01/10/2013  Rate: 117  Rhythm: sinus tachycardia  QRS Axis: normal  Intervals: normal  ST/T Wave abnormalities: normal  Conduction Disutrbances:none  Narrative Interpretation:   Old EKG Reviewed: none available    MDM   1. Community acquired pneumonia        Benny Lennert, MD 01/10/13 [phone]

## 2013-01-10 NOTE — H&P (Signed)
[facility] History and Physical  [name] XBJ:[id number] DOB: May 08, 1965 DOA: 01/10/2013  Referring physician: Valeria Batman, ER physician PCP: Milinda Antis, MD  Specialists: None  Chief Complaint: Shortness of breath and cough  HPI: [name] is a 48 y.o. female  Past medical history of obesity, tobacco abuse, CHF and COPD who for the last week has been having increased shortness of breath and persistent productive cough with yellowish sputum, who today started having worsening shortness of breath and came into the emergency room today.  In the emergency room, she was noted to have a white count of 18 and a chest x-ray suggestive of volume overload. Her BNP was mildly elevated at 1235. However she was noted at 95% shift differential and a CT scan was done which noted slowly no evidence of pleural effusion but did note most the lobar pneumonia. Patient was given IV antibiotics plus steroids plus breathing treatments and continuous oxygen. Hospitalists were called for further evaluation.  Review of Systems: I saw patient down in the emergency room, she is complaining of some shortness of breath plus wheezing. She denies any headaches or vision changes. Denied any dysphasia, chest pain, palpitations. She did complain of productive cough with yellowish sputum. She complained of head congestion. She denied any nausea, vomiting, abdominal pain, hematuria, dysuria, constipation, diarrhea, focal extremity weakness. She has been complaining of some left knee pain for the last week or so. In addition she has chronic lower extremity numbness from diabetes. Review systems is otherwise negative.  Past Medical History  Diagnosis Date  . Diabetes mellitus   . COPD (chronic obstructive pulmonary disease)   . HTN (hypertension)   . Low back pain   . Tachycardia     never had test done since no insurance  . Depression   . Asthma   . Shortness of breath   . Anxiety   . Gastric  erosions     EGD 08/2010.  . Internal hemorrhoids     Colonoscopy 5/12.  [name] Heavy menses   . Chronic respiratory failure with hypoxia     On 2-3 L of oxygen at home  . GERD (gastroesophageal reflux disease)   . Anemia   . Arthritis    Past Surgical History  Procedure Laterality Date  . Cholecystectomy  1990  . Cesarean section      twice  . Kidney surgery      as child for blockages  . Tubal ligation    . Tonsillectomy    . Wrist surgery  1995    Lt wrist  . Tympanostomy tube placement    . Hysteroscopy with thermachoice  01/17/2012    Procedure: HYSTEROSCOPY WITH THERMACHOICE;  Surgeon: Lazaro Arms, MD;  Location: [facility];  Service: Gynecology;  Laterality: N/A;  total therapy time: 9:13sec  D5W  18 ml in, D5W   18ml out, temperture 87degrees celcious  . Uterine ablation     Social History:  reports that she has been smoking Cigarettes.  She has a 15 pack-year smoking history. She has never used smokeless tobacco. She reports that  drinks alcohol. She reports that she does not use illicit drugs. Patient was at home with her son. Normally she is able to participate in most activities of daily living without assistance. In the last week, she's been noticing some increased pain in her left knee which has made walking difficult  Allergies  Allergen Reactions  . Codeine Itching and Nausea Only  .  Wellbutrin [Bupropion Hcl] Hives    Family History  Problem Relation Age of Onset  . Heart attack Father 1    deceased, etoh use  . Heart disease Father   . Heart attack Mother 12    deceased  . Diabetes Mother   . Breast cancer Mother   . Heart failure Mother     oxygen dependence, nonsmoker  . Heart disease Mother   . Depression Mother   . Cancer Mother   . Colon cancer Neg Hx   . Liver disease Maternal Aunt 40    died while on liver transplant list  . Heart attack Maternal Grandmother     premature CAD  . Ulcers Sister   . Hypertension Sister     Prior to Admission  medications   Medication Sig Start Date End Date Taking? Authorizing Provider  albuterol (PROVENTIL HFA) 108 (90 BASE) MCG/ACT inhaler Inhale 2 puffs into the lungs every 4 (four) hours as needed for wheezing or shortness of breath. Wheezing/shortnes of breath 11/09/11  Yes Salley Scarlet, MD  albuterol (PROVENTIL) (5 MG/ML) 0.5% nebulizer solution Take 2.5 mg by nebulization every 6 (six) hours as needed for wheezing.   Yes Historical Provider, MD  citalopram (CELEXA) 40 MG tablet Take 1 tablet (40 mg total) by mouth every morning. 03/07/12 03/07/13 Yes Salley Scarlet, MD  gabapentin (NEURONTIN) 300 MG capsule Take 300-600 mg by mouth 3 (three) times daily. Take 1 tab twice daily  and 2 at bedtime 09/09/12  Yes Salley Scarlet, MD  ibuprofen (ADVIL,MOTRIN) 200 MG tablet Take 200-400 mg by mouth every 6 (six) hours as needed for pain. For pain   Yes Historical Provider, MD  loperamide (IMODIUM A-D) 2 MG tablet Take 2 mg by mouth daily as needed for diarrhea or loose stools.   Yes Historical Provider, MD  metFORMIN (GLUCOPHAGE) 500 MG tablet Take 1 tablet (500 mg total) by mouth 2 (two) times daily with a meal. 12/27/12  Yes Salley Scarlet, MD  mometasone-formoterol [facility]) 100-5 MCG/ACT AERO Inhale 2 puffs into the lungs 2 (two) times daily as needed. For shortness of breath 02/27/12 02/26/13 Yes Salley Scarlet, MD  omeprazole (PRILOSEC) 20 MG capsule Take 20 mg by mouth every morning.   Yes Historical Provider, MD  oxyCODONE-acetaminophen (PERCOCET) 7.5-325 MG per tablet Take 1 tablet by mouth 3 (three) times daily as needed for pain. 12/27/12 12/27/13 Yes Salley Scarlet, MD  Pseudoephedrine HCl (PSEUDO PO) Take 2 tablets by mouth daily as needed (congestion).   Yes Historical Provider, MD  Pseudoephedrine-Guaifenesin (MUCINEX D) [phone] MG TB12 Take 1 tablet by mouth daily as needed (cough).   Yes Historical Provider, MD  valsartan (DIOVAN) 80 MG tablet Take 0.5 tablets (40 mg total) by mouth  daily. 12/31/12  Yes Salley Scarlet, MD  pregabalin (LYRICA) 75 MG capsule Take 1 capsule (75 mg total) by mouth 2 (two) times daily. 12/31/12   Salley Scarlet, MD   Physical Exam: Filed Vitals:   01/10/13 1623  BP: 145/58  Pulse: 97  Temp:   Resp: 20     General:  Alert and oriented x3, mild distress secondary to shortness of breath and pain  Eyes: Sclera nonicteric, extraocular movements are intact  ENT: Normocephalic, atraumatic, mucous membranes are slightly dry  Neck: Thick, no JVD  Cardiovascular: Borderline tachycardia, regular rhythm  Respiratory: Bilateral expiratory wheezing  Abdomen: Soft, obese, nontender, positive bowel sounds  Skin: No skin breaks, tears or  lesions  Musculoskeletal: No clubbing or cyanosis, trace pitting edema  Psychiatric: Patient is appropriate, no evidence of psychoses  Neurologic: No focal deficits, some mild decreased sensation in the lower extremity secondary to peripheral neuropathy from diabetes  Labs on Admission:  Basic Metabolic Panel:  Recent Labs Lab 01/10/13 1403  NA 134*  K 3.2*  CL 96  CO2 28  GLUCOSE 175*  BUN 5*  CREATININE 0.66  CALCIUM 9.1   Liver Function Tests:  Recent Labs Lab 01/10/13 1403  AST 16  ALT 11  ALKPHOS 51  BILITOT 0.7  PROT 7.3  ALBUMIN 3.5   CBC:  Recent Labs Lab 01/10/13 1403  WBC 18.3*  NEUTROABS 17.4*  HGB 10.4*  HCT 34.7*  MCV 74.1*  PLT 246   Cardiac Enzymes:  Recent Labs Lab 01/10/13 1403  TROPONINI <0.30    BNP (last 3 results)  Recent Labs  02/18/12 0601 01/10/13 1403  PROBNP 485.0* 1235.0*    Radiological Exams on Admission: Ct Chest W Contrast  01/10/2013   IMPRESSION: 1. Multifocal, bilateral areas of atelectasis and airspace consolidation. Findings are likely secondary to multifocal infection.  2. No pleural effusion or interstitial edema identified. .  3. Prior cholecystectomy.   Electronically Signed   By: Signa Kell M.D.   On: 01/10/2013  15:44   Dg Chest Port 1 View  01/10/2013     IMPRESSION: Cardiomegaly. Pulmonary edema suspected. Infectious infiltrate not excluded.  Please see above.   Electronically Signed   By: Bridgett Larsson   On: 01/10/2013 13:44    EKG: Independently reviewed. Sinus tachycardia  Assessment/Plan Active Problems:   HTN (hypertension): Continue antihypertensives.    DM type 2 (diabetes mellitus, type 2): Continue home meds plus sliding-scale.    Tobacco abuse: Counseled the patient. She accepts a nicotine patch.    Depression: Continue Celexa.   Morbid obesity   Peripheral neuropathy: Patient on Neurontin and had recently started Lyrica. Do not think she is supposed to be on both. For now we'll continue Lyrica and discontinue Neurontin    Oxygen dependent: Takes when necessary. Will continue on 3 L here until she is feeling better.  Pneumonia: Principal problem. Will treat for now as community-acquired. IV Rocephin and Zithromax. See below.  COPD exacerbation: IV Solu-Medrol plus oxygen + antibiotics + nebulizer treatments   Code Status: Full code  Family Communication: Left message with sun.  Disposition Plan: Next few days  Time spent: 35 minutes  [name] [facility] Pager [phone]  If 7PM-7AM, please contact night-coverage [email or web address] Password [facility] 01/10/2013, 5:03 PM

## 2013-01-11 DIAGNOSIS — I10 Essential (primary) hypertension: Secondary | ICD-10-CM

## 2013-01-11 LAB — CBC
HCT: 34.1 % — ABNORMAL LOW (ref 36.0–46.0)
Hemoglobin: 10.2 g/dL — ABNORMAL LOW (ref 12.0–15.0)
MCH: 22.2 pg — ABNORMAL LOW (ref 26.0–34.0)
MCHC: 29.9 g/dL — ABNORMAL LOW (ref 30.0–36.0)
MCV: 74.3 fL — ABNORMAL LOW (ref 78.0–100.0)
Platelets: 241 10*3/uL (ref 150–400)
RBC: 4.59 MIL/uL (ref 3.87–5.11)
RDW: 17.4 % — ABNORMAL HIGH (ref 11.5–15.5)
WBC: 17 10*3/uL — ABNORMAL HIGH (ref 4.0–10.5)

## 2013-01-11 LAB — BASIC METABOLIC PANEL
BUN: 7 mg/dL (ref 6–23)
CO2: 32 mEq/L (ref 19–32)
Calcium: 9.3 mg/dL (ref 8.4–10.5)
Chloride: 100 mEq/L (ref 96–112)
Creatinine, Ser: 0.64 mg/dL (ref 0.50–1.10)
GFR calc Af Amer: >90 mL/min (ref >90)
GFR calc non Af Amer: >90 mL/min (ref >90)
Glucose, Bld: 154 mg/dL — ABNORMAL HIGH (ref 70–99)
Potassium: 3.8 mEq/L (ref 3.5–5.1)
Sodium: 141 mEq/L (ref 135–145)

## 2013-01-11 LAB — GLUCOSE, CAPILLARY
Glucose-Capillary: 121 mg/dL — ABNORMAL HIGH (ref 70–99)
Glucose-Capillary: 106 mg/dL — ABNORMAL HIGH (ref 70–99)
Glucose-Capillary: 110 mg/dL — ABNORMAL HIGH (ref 70–99)
Glucose-Capillary: 118 mg/dL — ABNORMAL HIGH (ref 70–99)

## 2013-01-11 LAB — EXPECTORATED SPUTUM ASSESSMENT W GRAM STAIN, RFLX TO RESP C

## 2013-01-11 LAB — EXPECTORATED SPUTUM ASSESSMENT W REFEX TO RESP CULTURE

## 2013-01-11 LAB — STREP PNEUMONIAE URINARY ANTIGEN: Strep Pneumo Urinary Antigen: NEGATIVE

## 2013-01-11 LAB — HIV ANTIBODY (ROUTINE TESTING W REFLEX): HIV: NONREACTIVE

## 2013-01-11 MED ORDER — IPRATROPIUM BROMIDE 0.02 % IN SOLN
0.5000 mg | Freq: Four times a day (QID) | RESPIRATORY_TRACT | Status: DC
  Administered 2013-01-11 (×4): 0.5 mg via RESPIRATORY_TRACT
  Filled 2013-01-11 (×4): qty 2.5

## 2013-01-11 MED ORDER — ALBUTEROL SULFATE (5 MG/ML) 0.5% IN NEBU
2.5000 mg | INHALATION_SOLUTION | Freq: Four times a day (QID) | RESPIRATORY_TRACT | Status: DC
  Administered 2013-01-11 (×3): 2.5 mg via RESPIRATORY_TRACT
  Filled 2013-01-11 (×3): qty 0.5

## 2013-01-11 MED ORDER — METFORMIN HCL 500 MG PO TABS
500.0000 mg | ORAL_TABLET | Freq: Two times a day (BID) | ORAL | Status: DC
Start: 2013-01-13 — End: 2013-01-12

## 2013-01-11 NOTE — Progress Notes (Signed)
Patient started on Rocephin & Zithromax.  Antibiotic doses reviewed for renal function; doses ordered appropriate for current renal function (neither are renally dose adjusted).  Pharmacy will sign off.  Thank you. Junita Push, PharmD, BCPS

## 2013-01-11 NOTE — Progress Notes (Signed)
[facility] PROGRESS NOTE  [name]:[id number] DOB: May 20, 1965 DOA: 01/10/2013 PCP: Milinda Antis, MD  Assessment/Plan: Active Problems:   HTN (hypertension): Continue home meds.    DM type 2 (diabetes mellitus, type 2): Continue metformin and have added low-dose Lantus while on steroids.    Tobacco abuse: Patient continues to smoke. Counseled. Continue nicotine patch.    Depression: Continue Celexa.    Morbid obesity   Peripheral neuropathy: Continue Lyrica. Patient prior to admission was on Neurontin and had gotten a prescription for Lyrica which she had not yet filled yet. Upon discharge, needs to be clarified she needs to stop Neurontin and take a look only.  COPD exacerbation:: Continue Solu-Medrol, will continue current dose given wheezing. Continue nebs plus oxygen and antibiotics    Oxygen dependent: Patient only on 2 L when necessary. Currently on 46 L here. Continue oxygen.  Multilobar pneumonia: Patient seems to be clinically getting better. Persistent leukocytosis may be more likely steroids. If white blood cell count increases tomorrow or patient has fever, will change to more broad spectrum antibiotics. Continue Rocephin and Zithromax.  Code Status: Full Code Family Communication: Left message with daughter. Disposition Plan: Here for the next few days at least   Consultants:  None  Procedures:  None  Antibiotics:  IV Levaquin/vancomycin x1 dose in emergency room  IV Rocephin and Zithromax day 2  HPI/Subjective: Patient feeling better. Still some persistent productive cough. Breathing a little bit easier, but still short of breath  Objective: Filed Vitals:   01/11/13 0424  BP: 148/85  Pulse: 92  Temp: 98.2 F (36.8 C)  Resp: 24    Intake/Output Summary (Last 24 hours) at 01/11/13 1235 Last data filed at 01/11/13 0900  Gross per 24 hour  Intake    820 ml  Output    900 ml  Net    -80 ml   Filed Weights   01/10/13 1202  01/10/13 1800  Weight: 121.564 kg (268 lb) 122.6 kg (270 lb 4.5 oz)    Exam:   General:  Alert and oriented x3, no acute distress  Cardiovascular: Regular rate and rhythm, S1-S2  Respiratory: Better airway flow, still persistent bilateral expiratory wheezing  Abdomen: Soft, obese, nontender, positive bowel sounds  Musculoskeletal: No clubbing or cyanosis, trace pitting edema   Data Reviewed: Basic Metabolic Panel:  Recent Labs Lab 01/10/13 1403 01/11/13 0624  NA 134* 141  K 3.2* 3.8  CL 96 100  CO2 28 32  GLUCOSE 175* 154*  BUN 5* 7  CREATININE 0.66 0.64  CALCIUM 9.1 9.3   Liver Function Tests:  Recent Labs Lab 01/10/13 1403  AST 16  ALT 11  ALKPHOS 51  BILITOT 0.7  PROT 7.3  ALBUMIN 3.5   CBC:  Recent Labs Lab 01/10/13 1403 01/11/13 0624  WBC 18.3* 17.0*  NEUTROABS 17.4*  --   HGB 10.4* 10.2*  HCT 34.7* 34.1*  MCV 74.1* 74.3*  PLT 246 241   Cardiac Enzymes:  Recent Labs Lab 01/10/13 1403  TROPONINI <0.30   BNP (last 3 results)  Recent Labs  02/18/12 0601 01/10/13 1403  PROBNP 485.0* 1235.0*   CBG:  Recent Labs Lab 01/10/13 1743 01/10/13 2020 01/11/13 0751 01/11/13 1121  GLUCAP 178* 200* 121* 106*    Recent Results (from the past 240 hour(s))  CULTURE, BLOOD (ROUTINE X 2)     Status: None   Collection Time    01/10/13  6:20 PM      Result Value  Range Status   Specimen Description BLOOD RIGHT HAND   Final   Special Requests BOTTLES DRAWN AEROBIC AND ANAEROBIC 6CC EACH   Final   Culture NO GROWTH 1 DAY   Final   Report Status PENDING   Incomplete  CULTURE, BLOOD (ROUTINE X 2)     Status: None   Collection Time    01/10/13  6:30 PM      Result Value Range Status   Specimen Description BLOOD LEFT ARM   Final   Special Requests BOTTLES DRAWN AEROBIC AND ANAEROBIC 8CC EACH   Final   Culture NO GROWTH 1 DAY   Final   Report Status PENDING   Incomplete     Studies: Ct Chest W Contrast  01/10/2013   .  IMPRESSION: 1.  Multifocal, bilateral areas of atelectasis and airspace consolidation. Findings are likely secondary to multifocal infection.  2. No pleural effusion or interstitial edema identified. .  3. Prior cholecystectomy.   Electronically Signed   By: Signa Kell M.D.   On: 01/10/2013 15:44   Dg Chest Port 1 View  01/10/2013    IMPRESSION: Cardiomegaly. Pulmonary edema suspected. Infectious infiltrate not excluded.  Please see above.   Electronically Signed   By: Bridgett Larsson   On: 01/10/2013 13:44    Scheduled Meds: . sodium chloride   Intravenous STAT  . albuterol  2.5 mg Nebulization Q6H  . azithromycin  500 mg Intravenous Q24H  . cefTRIAXone (ROCEPHIN)  IV  1 g Intravenous Q24H  . citalopram  40 mg Oral q morning - 10a  . enoxaparin (LOVENOX) injection  40 mg Subcutaneous Q24H  . insulin aspart  0-5 Units Subcutaneous QHS  . insulin aspart  0-9 Units Subcutaneous TID WC  . insulin glargine  5 Units Subcutaneous QHS  . ipratropium  0.5 mg Nebulization Q6H  . irbesartan  37.5 mg Oral Daily  . [START ON 01/13/2013] metFORMIN  500 mg Oral BID WC  . methylPREDNISolone (SOLU-MEDROL) injection  60 mg Intravenous Q12H  . nicotine  21 mg Transdermal Daily  . pantoprazole  40 mg Oral Daily  . pregabalin  75 mg Oral BID  . sodium chloride  3 mL Intravenous Q12H   Continuous Infusions:   Active Problems:   HTN (hypertension)   DM type 2 (diabetes mellitus, type 2)   Tobacco abuse   Depression   Morbid obesity   Peripheral neuropathy   Oxygen dependent    Time spent: 25 minutes    [name]  [facility] Pager [phone]. If 7PM-7AM, please contact night-coverage at [email or web address], password [facility] 01/11/2013, 12:35 PM  LOS: 1 day

## 2013-01-12 DIAGNOSIS — E119 Type 2 diabetes mellitus without complications: Secondary | ICD-10-CM

## 2013-01-12 LAB — GLUCOSE, CAPILLARY
Glucose-Capillary: 129 mg/dL — ABNORMAL HIGH (ref 70–99)
Glucose-Capillary: 128 mg/dL — ABNORMAL HIGH (ref 70–99)
Glucose-Capillary: 172 mg/dL — ABNORMAL HIGH (ref 70–99)
Glucose-Capillary: 219 mg/dL — ABNORMAL HIGH (ref 70–99)

## 2013-01-12 LAB — CBC
HCT: 34.1 % — ABNORMAL LOW (ref 36.0–46.0)
Hemoglobin: 9.9 g/dL — ABNORMAL LOW (ref 12.0–15.0)
MCH: 22 pg — ABNORMAL LOW (ref 26.0–34.0)
MCHC: 29 g/dL — ABNORMAL LOW (ref 30.0–36.0)
MCV: 75.6 fL — ABNORMAL LOW (ref 78.0–100.0)
Platelets: 259 10*3/uL (ref 150–400)
RBC: 4.51 MIL/uL (ref 3.87–5.11)
RDW: 17.8 % — ABNORMAL HIGH (ref 11.5–15.5)
WBC: 16.8 10*3/uL — ABNORMAL HIGH (ref 4.0–10.5)

## 2013-01-12 LAB — LEGIONELLA ANTIGEN, URINE: Legionella Antigen, Urine: NEGATIVE

## 2013-01-12 LAB — BASIC METABOLIC PANEL
BUN: 10 mg/dL (ref 6–23)
CO2: 33 mEq/L — ABNORMAL HIGH (ref 19–32)
Calcium: 8.7 mg/dL (ref 8.4–10.5)
Chloride: 98 mEq/L (ref 96–112)
Creatinine, Ser: 0.59 mg/dL (ref 0.50–1.10)
GFR calc Af Amer: >90 mL/min (ref >90)
GFR calc non Af Amer: >90 mL/min (ref >90)
Glucose, Bld: 129 mg/dL — ABNORMAL HIGH (ref 70–99)
Potassium: 3.8 mEq/L (ref 3.5–5.1)
Sodium: 136 mEq/L (ref 135–145)

## 2013-01-12 MED ORDER — ALBUTEROL SULFATE (5 MG/ML) 0.5% IN NEBU: 2.5000 mg | INHALATION_SOLUTION | RESPIRATORY_TRACT | Status: DC | PRN

## 2013-01-12 MED ORDER — METHYLPREDNISOLONE SODIUM SUCC 125 MG IJ SOLR
60.0000 mg | Freq: Four times a day (QID) | INTRAMUSCULAR | Status: DC
  Administered 2013-01-12 – 2013-01-14 (×9): 60 mg via INTRAVENOUS
  Filled 2013-01-12 (×9): qty 2

## 2013-01-12 MED ORDER — ALBUTEROL SULFATE (5 MG/ML) 0.5% IN NEBU
2.5000 mg | INHALATION_SOLUTION | RESPIRATORY_TRACT | Status: DC
  Administered 2013-01-12 – 2013-01-20 (×45): 2.5 mg via RESPIRATORY_TRACT
  Filled 2013-01-12 (×43): qty 0.5

## 2013-01-12 MED ORDER — IPRATROPIUM BROMIDE 0.02 % IN SOLN
0.5000 mg | RESPIRATORY_TRACT | Status: DC
  Administered 2013-01-12 – 2013-01-20 (×45): 0.5 mg via RESPIRATORY_TRACT
  Filled 2013-01-12 (×43): qty 2.5

## 2013-01-12 NOTE — Progress Notes (Signed)
[facility] PROGRESS NOTE  LUETTA PIAZZA YNW:[id number] DOB: 09-14-1965 DOA: 01/10/2013 PCP: Milinda Antis, MD  Assessment/Plan: 1. Multifocal community acquired pneumonia: Continue empiric antibiotics. Followup cultures. 2. Oxygen-dependent COPD with acute exacerbation and acute on chronic respiratory failure: No significant improvement thus far. Increase steroid frequency. Continue oxygen, nebulizers, antibiotics 3. Chronic respiratory failure 4. Diabetes mellitus type 2: stable. Continue sliding scale insulin, Lantus. Hold metformin while in inpatient. Hemoglobin A1c 6.7. 5. Ongoing smoking cigarettes: Recommend cessation.   Discontinue metformin  Continue current treatments  Pending studies:   Blood cultures  Sputum culture  Code Status: Full code DVT prophylaxis: Lovenox Family Communication: none  Disposition Plan: home when improved  Brendia Sacks, MD  [facility]  Pager [phone] If 7PM-7AM, please contact night-coverage at [email or web address], password [facility] 01/12/2013, 8:22 AM  LOS: 2 days   Summary: 47 year old woman with history of oxygen-dependent COPD presented with shortness of breath and productive cough. On admission noted to have leukocytosis 18, CT scan revealed multilobar pneumonia.  Consultants:    Procedures:    Antibiotics:  Azithromycin 9/19 >>   Ceftriaxone 9/19 >>   HPI/Subjective: Feels worse than yesterday but better since admission. Continues to have cough, wheezing and shortness of breath.  Objective: Filed Vitals:   01/11/13 2327 01/12/13 0324 01/12/13 0647 01/12/13 0700  BP:   146/82   Pulse:   76   Temp:   97.9 F (36.6 C)   TempSrc:   Oral   Resp:   21   Height:      Weight:      SpO2: 87% 92% 92% 92%    Intake/Output Summary (Last 24 hours) at 01/12/13 6295 Last data filed at 01/11/13 1734  Gross per 24 hour  Intake    580 ml  Output      0 ml  Net    580 ml     Filed Weights   01/10/13 1202  01/10/13 1800  Weight: 121.564 kg (268 lb) 122.6 kg (270 lb 4.5 oz)    Exam:   Afebrile, hemodynamically stable. High oxygen requirement 5-6 L appears to be stable.  General: Appears calm, mildly uncomfortable. Nontoxic.  Psychiatric: Grossly normal mood and affect. Speech fluent and appropriate.  Cardiovascular: Regular rate and rhythm. No murmur, rub, gallop. No lower extremity edema.  Respiratory: Diffuse wheezes bilaterally, poor air movement. Mild increased respiratory effort. Able to speak in full sentences.  Data Reviewed:  Capillary blood sugars stable  Basic metabolic panel unremarkable  Leukocytosis without significant change  Stable microcytic anemia  Scheduled Meds: . albuterol  2.5 mg Nebulization Q4H WA  . azithromycin  500 mg Intravenous Q24H  . cefTRIAXone (ROCEPHIN)  IV  1 g Intravenous Q24H  . citalopram  40 mg Oral q morning - 10a  . enoxaparin (LOVENOX) injection  40 mg Subcutaneous Q24H  . insulin aspart  0-5 Units Subcutaneous QHS  . insulin aspart  0-9 Units Subcutaneous TID WC  . insulin glargine  5 Units Subcutaneous QHS  . ipratropium  0.5 mg Nebulization Q4H WA  . irbesartan  37.5 mg Oral Daily  . [START ON 01/13/2013] metFORMIN  500 mg Oral BID WC  . methylPREDNISolone (SOLU-MEDROL) injection  60 mg Intravenous Q12H  . nicotine  21 mg Transdermal Daily  . pantoprazole  40 mg Oral Daily  . pregabalin  75 mg Oral BID  . sodium chloride  3 mL Intravenous Q12H   Continuous Infusions:   Active Problems:   HTN (hypertension)  DM type 2 (diabetes mellitus, type 2)   Tobacco abuse   Depression   Morbid obesity   Peripheral neuropathy   Oxygen dependent   Time spent 20 minutes

## 2013-01-12 NOTE — Progress Notes (Signed)
Pt treatment's have been increased to every 4hr while awake albuterol/atrovent to better cover her wheezes and sob.

## 2013-01-13 ENCOUNTER — Ambulatory Visit: Payer: Medicaid Other | Admitting: Internal Medicine

## 2013-01-13 DIAGNOSIS — J9610 Chronic respiratory failure, unspecified whether with hypoxia or hypercapnia: Secondary | ICD-10-CM

## 2013-01-13 LAB — GLUCOSE, CAPILLARY
Glucose-Capillary: 194 mg/dL — ABNORMAL HIGH (ref 70–99)
Glucose-Capillary: 157 mg/dL — ABNORMAL HIGH (ref 70–99)
Glucose-Capillary: 176 mg/dL — ABNORMAL HIGH (ref 70–99)
Glucose-Capillary: 221 mg/dL — ABNORMAL HIGH (ref 70–99)

## 2013-01-13 MED ORDER — FLUTICASONE PROPIONATE 50 MCG/ACT NA SUSP
2.0000 | Freq: Every day | NASAL | Status: DC
  Administered 2013-01-13 – 2013-01-20 (×7): 2 via NASAL
  Filled 2013-01-13: qty 16

## 2013-01-13 MED ORDER — GUAIFENESIN ER 600 MG PO TB12
600.0000 mg | ORAL_TABLET | Freq: Two times a day (BID) | ORAL | Status: DC
  Administered 2013-01-13 – 2013-01-20 (×15): 600 mg via ORAL
  Filled 2013-01-13 (×15): qty 1

## 2013-01-13 MED ORDER — GUAIFENESIN-DM 100-10 MG/5ML PO SYRP
5.0000 mL | ORAL_SOLUTION | ORAL | Status: DC | PRN
  Administered 2013-01-14: 5 mL via ORAL
  Filled 2013-01-13: qty 5

## 2013-01-13 NOTE — Progress Notes (Signed)
UR Chart Review Completed  

## 2013-01-13 NOTE — Progress Notes (Signed)
Inpatient Diabetes Program Recommendations  AACE/ADA: New Consensus Statement on Inpatient Glycemic Control (2013)  Target Ranges:  Prepandial:   less than 140 mg/dL      Peak postprandial:   less than 180 mg/dL (1-2 hours)      Critically ill patients:  140 - 180 mg/dL   Results for [name], [name] (MRN [id number]) as of 01/13/2013 08:37  Ref. Range 01/12/2013 08:01 01/12/2013 11:39 01/12/2013 16:12 01/12/2013 20:44 01/13/2013 07:31  Glucose-Capillary Latest Range: 70-99 mg/dL 782 (H) 956 (H) 213 (H) 219 (H) 194 (H)    Inpatient Diabetes Program Recommendations Correction (SSI): Please consider increasing Novolog correction to moderate scale ACHS.  Note: Patient has a history of diabetes and takes Metformin 500 mg BID as an outpatient for diabetes management.  Currently, patient is ordered to receive Lantus 5 units QHS, Novolog 0-9 units AC, and Novolog 0-5 units HS for inpatient glycemic control.  Patient is ordered Solumedrol 60 mg Q6H which is contributing to hyperglycemia.  Please consider increasing Novolog correction scale from sensitive to moderate scale ACHS.  Will continue to follow.  Thanks, Orlando Penner, RN, MSN, CCRN Diabetes Coordinator Inpatient Diabetes Program [phone] (Team Pager) [phone] (AP office) [phone] [facility] office)

## 2013-01-13 NOTE — Progress Notes (Signed)
[facility] PROGRESS NOTE  CLEONA DOUBLEDAY WUJ:[id number] DOB: 12/17/1965 DOA: 01/10/2013 PCP: Milinda Antis, MD  Assessment/Plan: 1. Multifocal community acquired pneumonia: Mild improvement in oxygen requirement. Nontoxic. Continue empiric antibiotics. Followup cultures. 2. Oxygen-dependent COPD with acute exacerbation and acute on chronic respiratory failure: Mild improvement, continue steroids. Continue oxygen, nebulizers, antibiotics 3. Chronic respiratory failure 4. Diabetes mellitus type 2: stable. Continue sliding scale insulin, Lantus. Hold metformin while in inpatient. Hemoglobin A1c 6.7. 5. Ongoing smoking cigarettes: Recommend cessation.   Continue current treatments  Pending studies:   Blood cultures  Sputum culture  Code Status: Full code DVT prophylaxis: Lovenox Family Communication: none  Disposition Plan: home when improved  Brendia Sacks, MD  [facility]  Pager [phone] If 7PM-7AM, please contact night-coverage at [email or web address], password [facility] 01/13/2013, 12:02 PM  LOS: 3 days   Summary: 47 year old woman with history of oxygen-dependent COPD presented with shortness of breath and productive cough. On admission noted to have leukocytosis 18, CT scan revealed multilobar pneumonia.  Consultants:    Procedures:    Antibiotics:  Azithromycin 9/19 >>   Ceftriaxone 9/19 >>   HPI/Subjective: Feels about the same today. Still has significant cough. Ambulating okay but remain short of breath. Wheezing persists.  Objective: Filed Vitals:   01/12/13 2042 01/13/13 0415 01/13/13 0713 01/13/13 1122  BP: 123/67 140/83    Pulse: 83 87    Temp: 98.4 F (36.9 C) 97.4 F (36.3 C)    TempSrc: Oral Oral    Resp: 22 22    Height:      Weight:  125.6 kg (276 lb 14.4 oz)    SpO2: 94% 95% 90% 90%    Intake/Output Summary (Last 24 hours) at 01/13/13 1202 Last data filed at 01/13/13 8657  Gross per 24 hour  Intake   1443 ml  Output     900 ml  Net    543 ml     Filed Weights   01/10/13 1202 01/10/13 1800 01/13/13 0415  Weight: 121.564 kg (268 lb) 122.6 kg (270 lb 4.5 oz) 125.6 kg (276 lb 14.4 oz)    Exam:   Afebrile, hemodynamically stable. Hypoxia mildly improved with decreased oxygen requirement.  General: Appears calm, mildly uncomfortable. Nontoxic.  Cardiovascular: Regular rate and rhythm. No murmur, rub, gallop.  Respiratory: Improved air movement. Diffuse wheezes persist. No rhonchi or rales. Mild increased respiratory effort. Somewhat dyspneic but able to speak in full sentences.  Data Reviewed:  Capillary blood sugars stable  Scheduled Meds: . albuterol  2.5 mg Nebulization Q4H WA  . azithromycin  500 mg Intravenous Q24H  . cefTRIAXone (ROCEPHIN)  IV  1 g Intravenous Q24H  . citalopram  40 mg Oral q morning - 10a  . enoxaparin (LOVENOX) injection  40 mg Subcutaneous Q24H  . insulin aspart  0-5 Units Subcutaneous QHS  . insulin aspart  0-9 Units Subcutaneous TID WC  . insulin glargine  5 Units Subcutaneous QHS  . ipratropium  0.5 mg Nebulization Q4H WA  . irbesartan  37.5 mg Oral Daily  . methylPREDNISolone (SOLU-MEDROL) injection  60 mg Intravenous Q6H  . nicotine  21 mg Transdermal Daily  . pantoprazole  40 mg Oral Daily  . pregabalin  75 mg Oral BID  . sodium chloride  3 mL Intravenous Q12H   Continuous Infusions:   Active Problems:   HTN (hypertension)   DM type 2 (diabetes mellitus, type 2)   Tobacco abuse   Depression   Morbid obesity   Peripheral  neuropathy   Oxygen dependent   Time spent 20 minutes

## 2013-01-13 NOTE — Care Management Note (Signed)
    Page 1 of 1   01/20/2013     11:23:16 AM   CARE MANAGEMENT NOTE 01/20/2013  Patient:  [name],Lonia A   Account Number:  [id number]  Date Initiated:  01/13/2013  Documentation initiated by:  Rosemary Holms  Subjective/Objective Assessment:   Pt admitted from home where she lives with her 47 yo son. Pt states she does have O2 at home and checks her O2 sats to tell her when she needs it. She continues to spoke. O2 comes from [place]. Pt would like to have [facility] RN at DC     Action/Plan:   CM discussed [facility] and pt is agreeable to be contacted. DME needs not anticipated.   Anticipated DC Date:  01/20/2013   Anticipated DC Plan:  HOME W HOME HEALTH SERVICES      DC Planning Services  CM consult      Choice offered to / List presented to:          [facility] arranged  HH-10 DISEASE MANAGEMENT  HH-1 RN      [facility] agency  [facility]   Status of service:  Completed, signed off Medicare Important Message given?  YES (If response is "NO", the following Medicare IM given date fields will be blank) Date Medicare IM given:  01/20/2013 Date Additional Medicare IM given:    Discharge Disposition:  HOME W HOME HEALTH SERVICES  Per UR Regulation:    If discussed at Long Length of Stay Meetings, dates discussed:    Comments:  01/14/13 Rosemary Holms RN BSN CM Referral made to [facility]. Anibal Henderson will contact pt.  01/13/13 Rosemary Holms RN BNS CM

## 2013-01-14 DIAGNOSIS — Z9981 Dependence on supplemental oxygen: Secondary | ICD-10-CM

## 2013-01-14 DIAGNOSIS — J15212 Pneumonia due to Methicillin resistant Staphylococcus aureus: Secondary | ICD-10-CM

## 2013-01-14 LAB — GLUCOSE, CAPILLARY
Glucose-Capillary: 150 mg/dL — ABNORMAL HIGH (ref 70–99)
Glucose-Capillary: 199 mg/dL — ABNORMAL HIGH (ref 70–99)
Glucose-Capillary: 199 mg/dL — ABNORMAL HIGH (ref 70–99)
Glucose-Capillary: 188 mg/dL — ABNORMAL HIGH (ref 70–99)

## 2013-01-14 LAB — BASIC METABOLIC PANEL
BUN: 12 mg/dL (ref 6–23)
CO2: 32 mEq/L (ref 19–32)
Calcium: 8.6 mg/dL (ref 8.4–10.5)
Chloride: 99 mEq/L (ref 96–112)
Creatinine, Ser: 0.54 mg/dL (ref 0.50–1.10)
GFR calc Af Amer: >90 mL/min (ref >90)
GFR calc non Af Amer: >90 mL/min (ref >90)
Glucose, Bld: 192 mg/dL — ABNORMAL HIGH (ref 70–99)
Potassium: 3.6 mEq/L (ref 3.5–5.1)
Sodium: 139 mEq/L (ref 135–145)

## 2013-01-14 LAB — CULTURE, RESPIRATORY W GRAM STAIN

## 2013-01-14 LAB — CBC
HCT: 32.8 % — ABNORMAL LOW (ref 36.0–46.0)
Hemoglobin: 9.5 g/dL — ABNORMAL LOW (ref 12.0–15.0)
MCH: 21.9 pg — ABNORMAL LOW (ref 26.0–34.0)
MCHC: 29 g/dL — ABNORMAL LOW (ref 30.0–36.0)
MCV: 75.6 fL — ABNORMAL LOW (ref 78.0–100.0)
Platelets: 232 10*3/uL (ref 150–400)
RBC: 4.34 MIL/uL (ref 3.87–5.11)
RDW: 17.3 % — ABNORMAL HIGH (ref 11.5–15.5)
WBC: 12.2 10*3/uL — ABNORMAL HIGH (ref 4.0–10.5)

## 2013-01-14 LAB — CULTURE, RESPIRATORY

## 2013-01-14 MED ORDER — SULFAMETHOXAZOLE-TMP DS 800-160 MG PO TABS
2.0000 | ORAL_TABLET | Freq: Two times a day (BID) | ORAL | Status: DC
  Administered 2013-01-14 – 2013-01-20 (×13): 2 via ORAL
  Filled 2013-01-14 (×13): qty 2

## 2013-01-14 MED ORDER — PREDNISONE 20 MG PO TABS
40.0000 mg | ORAL_TABLET | Freq: Every day | ORAL | Status: DC
  Administered 2013-01-15: 40 mg via ORAL
  Filled 2013-01-14: qty 2

## 2013-01-14 MED ORDER — AZITHROMYCIN 250 MG PO TABS
500.0000 mg | ORAL_TABLET | Freq: Every day | ORAL | Status: AC
  Administered 2013-01-14: 500 mg via ORAL
  Filled 2013-01-14: qty 2

## 2013-01-14 MED ORDER — HYDROCOD POLST-CHLORPHEN POLST 10-8 MG/5ML PO LQCR
5.0000 mL | Freq: Two times a day (BID) | ORAL | Status: DC
  Administered 2013-01-14 – 2013-01-20 (×13): 5 mL via ORAL
  Filled 2013-01-14 (×13): qty 5

## 2013-01-14 NOTE — Progress Notes (Signed)
Pt ambulated hallways.  Dysnea with exertion, still.  On 3L O2 stats dropped to 85-87%.  Return to 95% at rest, though.  Patient continues to be on 3L O2.

## 2013-01-14 NOTE — Progress Notes (Signed)
PHARMACIST - PHYSICIAN COMMUNICATION DR:   [place] CONCERNING: Antibiotic IV to Oral Route Change Policy  RECOMMENDATION: This patient is receiving Zithromax by the intravenous route.  Based on criteria approved by the Pharmacy and Therapeutics Committee, the antibiotic(s) is/are being converted to the equivalent oral dose form(s).  DESCRIPTION: These criteria include:  Patient being treated for a respiratory tract infection, urinary tract infection, cellulitis or clostridium difficile associated diarrhea if on metronidazole  The patient is not neutropenic and does not exhibit a GI malabsorption state  The patient is eating (either orally or via tube) and/or has been taking other orally administered medications for a least 24 hours  The patient is improving clinically and has a Tmax < 100.5  If you have questions about this conversion, please contact the Pharmacy Department  [x]  ( [phone] )  [place] []  ( [phone] )  [place]  []  ( [phone] )  [facility] []  ( [phone] )  [facility]    S. Lorea Kupfer, PharmD 

## 2013-01-14 NOTE — Progress Notes (Signed)
[facility] PROGRESS NOTE  [name]:[id number] DOB: 1965-09-04 DOA: 01/10/2013 PCP: Milinda Antis, MD  Summary: 47 year old woman with history of oxygen-dependent COPD presented with shortness of breath and productive cough. On admission noted to have leukocytosis 18, CT scan revealed multilobar pneumonia. Patient placed on steroids, nebulizer treatment and empiric Zithromax and Rocephin. She slowly improved clinically. Leukocytosis resolving. Sputum culture results 9/22 revealed MRSA. Discussed with infectious disease Dr. Comer--recommends Bactrim for one week. Overall patient appears to be improving and likely will be ready for discharge within 48 hours.  Assessment/Plan: 1. Multifocal community acquired pneumonia: Slowly improving. Discussed culture results with Dr. Luciana Axe, infectious disease. Recommends Bactrim for an additional week. Finish atypical coverage today. 2. Oxygen-dependent COPD with acute exacerbation and acute on chronic respiratory failure: Slowly improving, wean steroids. Continue oxygen, nebulizers, antibiotics 3. Chronic respiratory failure 4. Diabetes mellitus type 2: stable. Continue sliding scale insulin, Lantus. Hold metformin while in inpatient. Hemoglobin A1c 6.7. 5. Ongoing smoking cigarettes: Recommend cessation.   Last dose of Zithromax today  Start Bactrim  Wean steroids  Anticipate discharge one to 2 days  Pending studies:   Blood cultures  Code Status: Full code DVT prophylaxis: Lovenox Family Communication: none  Disposition Plan: home when improved  Brendia Sacks, MD  [facility]  Pager [phone] If 7PM-7AM, please contact night-coverage at [email or web address], password [facility] 01/14/2013, 11:54 AM  LOS: 4 days   Consultants:  none  Procedures:  none  Antibiotics:  Azithromycin 9/19 >> 9/23  Ceftriaxone 9/19 >> 9/22  Bactrim 9/23 >> 9/29  HPI/Subjective: Feels a little bit better today. Breathing a little  bit better. Still coughing a lot.  Objective: Filed Vitals:   01/14/13 0340 01/14/13 0550 01/14/13 0656 01/14/13 1142  BP:  143/72    Pulse:  70    Temp:  98.4 F (36.9 C)    TempSrc:  Oral    Resp:  18    Height:      Weight:      SpO2: 98% 99% 99% 97%    Intake/Output Summary (Last 24 hours) at 01/14/13 1154 Last data filed at 01/14/13 0905  Gross per 24 hour  Intake   1500 ml  Output   1000 ml  Net    500 ml     Filed Weights   01/10/13 1202 01/10/13 1800 01/13/13 0415  Weight: 121.564 kg (268 lb) 122.6 kg (270 lb 4.5 oz) 125.6 kg (276 lb 14.4 oz)    Exam:   Afebrile, hemodynamically stable. Hypoxia appears to be improving, near baseline.  General: Appears calm, more comfortable today. Nontoxic.  Cardiovascular: Regular rate and rhythm. No murmur, rub, gallop.  Respiratory: Improved air movement. Decreased wheezes. No rhonchi or rales. Normal respiratory effort.  Psychiatric: Grossly normal mood and affect. Speech fluent and appropriate.  Data Reviewed:  Capillary blood sugars stable  Basic metabolic panel unremarkable.  CBC with improvement in leukocytosis, anemia.  Sputum culture MRSA, sensitive to Bactrim.  Scheduled Meds: . albuterol  2.5 mg Nebulization Q4H WA  . azithromycin  500 mg Oral q1800  . citalopram  40 mg Oral q morning - 10a  . enoxaparin (LOVENOX) injection  40 mg Subcutaneous Q24H  . fluticasone  2 spray Each Nare Daily  . guaiFENesin  600 mg Oral BID  . insulin aspart  0-5 Units Subcutaneous QHS  . insulin aspart  0-9 Units Subcutaneous TID WC  . insulin glargine  5 Units Subcutaneous QHS  . ipratropium  0.5 mg  Nebulization Q4H WA  . irbesartan  37.5 mg Oral Daily  . methylPREDNISolone (SOLU-MEDROL) injection  60 mg Intravenous Q6H  . nicotine  21 mg Transdermal Daily  . pantoprazole  40 mg Oral Daily  . pregabalin  75 mg Oral BID  . sodium chloride  3 mL Intravenous Q12H  . sulfamethoxazole-trimethoprim  2 tablet Oral Q12H    Continuous Infusions:   Principal Problem:   MRSA pneumonia Active Problems:   HTN (hypertension)   DM type 2 (diabetes mellitus, type 2)   Tobacco abuse   Depression   Morbid obesity   Peripheral neuropathy   Oxygen dependent   Time spent 20 minutes

## 2013-01-14 NOTE — Progress Notes (Signed)
CRITICAL VALUE ALERT  Critical value received:  Sputum culture positive for MRSA  Date of notification:  01/14/13  Time of notification:  0740  Critical value read back:yes  Nurse who received alert:  Sherrye Payor RN    MD notified (1st page):  Dr Irene Limbo  Time of first page:  0800  MD notified (2nd page):  Time of second page:  Responding MD:  Dr Irene Limbo  Time MD responded:  0800  Placed on orange contact precaution.

## 2013-01-14 NOTE — Progress Notes (Signed)
Patient evaluated for community based chronic disease management services with [facility] Care Management Program as a benefit of patient's [employer]. Patient will receive a post discharge transition of care call and will be evaluated for monthly home visits for assessments and COPD/PNA disease process education. Spoke with patient via phone to explain [facility] Care Management services.  Patient has accepted services.  PCP has been verified. Made inpatient Case Manager aware that [facility] Care Management following. Of note, [facility] Care Management services does not replace or interfere with any services that are arranged by inpatient case management or social work.  For additional questions or referrals please contact Anibal Henderson BSN RN [facility] Liaison at [phone].

## 2013-01-15 LAB — CULTURE, BLOOD (ROUTINE X 2)
Culture: NO GROWTH
Culture: NO GROWTH

## 2013-01-15 LAB — GLUCOSE, CAPILLARY
Glucose-Capillary: 92 mg/dL (ref 70–99)
Glucose-Capillary: 148 mg/dL — ABNORMAL HIGH (ref 70–99)
Glucose-Capillary: 207 mg/dL — ABNORMAL HIGH (ref 70–99)
Glucose-Capillary: 270 mg/dL — ABNORMAL HIGH (ref 70–99)

## 2013-01-15 MED ORDER — METHYLPREDNISOLONE SODIUM SUCC 125 MG IJ SOLR
60.0000 mg | Freq: Three times a day (TID) | INTRAMUSCULAR | Status: DC
  Administered 2013-01-15 – 2013-01-20 (×15): 60 mg via INTRAVENOUS
  Filled 2013-01-15 (×15): qty 2

## 2013-01-15 NOTE — Progress Notes (Signed)
[facility] PROGRESS NOTE  LATEKA RADY NWG:[id number] DOB: 1966-01-07 DOA: 01/10/2013 PCP: Milinda Antis, MD  Assessment/Plan: Multifocal community acquired pneumonia: Slowly improving. Results discussed with Dr. Luciana Axe, infectious disease by Dr. Irene Limbo. Recommends Bactrim for an additional week. She has completed atypical coverage. She has been afebrile and clinically improving  Oxygen-dependent COPD with acute exacerbation and acute on chronic respiratory failure: slow to improve, still wheezing.  Will give one day of IV steroids and hopefully transition back to prednisone tomorrow. Continue oxygen, nebulizers, antibiotics   Chronic respiratory failure   Diabetes mellitus type 2: stable. Continue sliding scale insulin, Lantus. Hold metformin while in inpatient. Hemoglobin A1c 6.7.   Ongoing smoking cigarettes: Recommend cessation  Code Status: full code Family Communication: none Disposition Plan: discharge home once improved   Consultants:  none  Procedures:  none   HPI/Subjective: Ambulated today and still feels very short of breath, desaturated into the 80s.  Objective: Filed Vitals:   01/15/13 1605  BP: 130/71  Pulse: 77  Temp: 98 F (36.7 C)  Resp: 20    Intake/Output Summary (Last 24 hours) at 01/15/13 1636 Last data filed at 01/15/13 0512  Gross per 24 hour  Intake 2028.67 ml  Output      0 ml  Net 2028.67 ml   Filed Weights   01/10/13 1202 01/10/13 1800 01/13/13 0415  Weight: 121.564 kg (268 lb) 122.6 kg (270 lb 4.5 oz) 125.6 kg (276 lb 14.4 oz)    Exam:   General:  NAD  Cardiovascular: S1, S2 RRR  Respiratory: bilateral exp wheezes  Abdomen: soft, obese, nt, bs+  Musculoskeletal: no edema b/l   Data Reviewed: Basic Metabolic Panel:  Recent Labs Lab 01/10/13 1403 01/11/13 0624 01/12/13 0640 01/14/13 0556  NA 134* 141 136 139  K 3.2* 3.8 3.8 3.6  CL 96 100 98 99  CO2 28 32 33* 32  GLUCOSE 175* 154* 129* 192*   BUN 5* 7 10 12   CREATININE 0.66 0.64 0.59 0.54  CALCIUM 9.1 9.3 8.7 8.6   Liver Function Tests:  Recent Labs Lab 01/10/13 1403  AST 16  ALT 11  ALKPHOS 51  BILITOT 0.7  PROT 7.3  ALBUMIN 3.5   No results found for this basename: LIPASE, AMYLASE,  in the last 168 hours No results found for this basename: AMMONIA,  in the last 168 hours CBC:  Recent Labs Lab 01/10/13 1403 01/11/13 0624 01/12/13 0640 01/14/13 0556  WBC 18.3* 17.0* 16.8* 12.2*  NEUTROABS 17.4*  --   --   --   HGB 10.4* 10.2* 9.9* 9.5*  HCT 34.7* 34.1* 34.1* 32.8*  MCV 74.1* 74.3* 75.6* 75.6*  PLT 246 241 259 232   Cardiac Enzymes:  Recent Labs Lab 01/10/13 1403  TROPONINI <0.30   BNP (last 3 results)  Recent Labs  02/18/12 0601 01/10/13 1403  PROBNP 485.0* 1235.0*   CBG:  Recent Labs Lab 01/14/13 1144 01/14/13 1703 01/14/13 2135 01/15/13 0756 01/15/13 1210  GLUCAP 199* 199* 188* 92 148*    Recent Results (from the past 240 hour(s))  CULTURE, BLOOD (ROUTINE X 2)     Status: None   Collection Time    01/10/13  6:20 PM      Result Value Range Status   Specimen Description BLOOD RIGHT HAND   Final   Special Requests BOTTLES DRAWN AEROBIC AND ANAEROBIC [facility] EACH   Final   Culture NO GROWTH 5 DAYS   Final   Report Status 01/15/2013 FINAL  Final  CULTURE, BLOOD (ROUTINE X 2)     Status: None   Collection Time    01/10/13  6:30 PM      Result Value Range Status   Specimen Description BLOOD LEFT ARM   Final   Special Requests BOTTLES DRAWN AEROBIC AND ANAEROBIC 8CC EACH   Final   Culture NO GROWTH 5 DAYS   Final   Report Status 01/15/2013 FINAL   Final  CULTURE, EXPECTORATED SPUTUM-ASSESSMENT     Status: None   Collection Time    01/11/13  8:30 PM      Result Value Range Status   Specimen Description SPUTUM   Final   Special Requests NONE   Final   Sputum evaluation     Final   Value: THIS SPECIMEN IS ACCEPTABLE. RESPIRATORY CULTURE REPORT TO FOLLOW.   Report Status 01/11/2013  FINAL   Final  CULTURE, RESPIRATORY (NON-EXPECTORATED)     Status: None   Collection Time    01/11/13  8:30 PM      Result Value Range Status   Specimen Description SPUTUM   Final   Special Requests NONE   Final   Gram Stain     Final   Value: MODERATE WBC PRESENT, PREDOMINANTLY PMN     FEW SQUAMOUS EPITHELIAL CELLS PRESENT     RARE GRAM POSITIVE COCCI     IN PAIRS IN CLUSTERS     Performed at [facility]   Culture     Final   Value: MODERATE METHICILLIN RESISTANT STAPHYLOCOCCUS AUREUS     Note: RIFAMPIN AND GENTAMICIN SHOULD NOT BE USED AS SINGLE DRUGS FOR TREATMENT OF STAPH INFECTIONS. CRITICAL RESULT CALLED TO, READ BACK BY AND VERIFIED WITH: MEGAN B@7 :42AM ON 01/14/13 BY DANTS     Performed at [facility]   Report Status 01/14/2013 FINAL   Final   Organism ID, Bacteria METHICILLIN RESISTANT STAPHYLOCOCCUS AUREUS   Final     Studies: No results found.  Scheduled Meds: . albuterol  2.5 mg Nebulization Q4H WA  . chlorpheniramine-HYDROcodone  5 mL Oral Q12H  . citalopram  40 mg Oral q morning - 10a  . enoxaparin (LOVENOX) injection  40 mg Subcutaneous Q24H  . fluticasone  2 spray Each Nare Daily  . guaiFENesin  600 mg Oral BID  . insulin aspart  0-5 Units Subcutaneous QHS  . insulin aspart  0-9 Units Subcutaneous TID WC  . insulin glargine  5 Units Subcutaneous QHS  . ipratropium  0.5 mg Nebulization Q4H WA  . irbesartan  37.5 mg Oral Daily  . methylPREDNISolone (SOLU-MEDROL) injection  60 mg Intravenous Q8H  . nicotine  21 mg Transdermal Daily  . pantoprazole  40 mg Oral Daily  . pregabalin  75 mg Oral BID  . sodium chloride  3 mL Intravenous Q12H  . sulfamethoxazole-trimethoprim  2 tablet Oral Q12H   Continuous Infusions:   Principal Problem:   MRSA pneumonia Active Problems:   HTN (hypertension)   DM type 2 (diabetes mellitus, type 2)   Tobacco abuse   Depression   Morbid obesity   Peripheral neuropathy   Oxygen dependent    Time spent:     [facility]  [facility] Pager [phone]. If 7PM-7AM, please contact night-coverage at [email or web address], password [facility] 01/15/2013, 4:36 PM  LOS: 5 days

## 2013-01-15 NOTE — Progress Notes (Signed)
Patient ambulated hallways, tolerated fairly. ON 2L O2 patients stats remained at 90% for approximately 1 minute, then dropped to 86%.  Bumped O2 up to 3L and again patient held at 90-91% for about one minute, then started to drop to 87% again.  Pt still SOB with exertion, audible wheezing while ambulating and c/o chest feeling tighter.

## 2013-01-16 LAB — GLUCOSE, CAPILLARY
Glucose-Capillary: 183 mg/dL — ABNORMAL HIGH (ref 70–99)
Glucose-Capillary: 225 mg/dL — ABNORMAL HIGH (ref 70–99)
Glucose-Capillary: 220 mg/dL — ABNORMAL HIGH (ref 70–99)
Glucose-Capillary: 284 mg/dL — ABNORMAL HIGH (ref 70–99)

## 2013-01-16 NOTE — Progress Notes (Signed)
[facility] PROGRESS NOTE  [name] ZOX:[id number] DOB: 1965/06/03 DOA: 01/10/2013 PCP: Milinda Antis, MD  Assessment/Plan: Multifocal community acquired pneumonia, MRSA: Slowly improving. Results discussed with Dr. Luciana Axe, infectious disease by Dr. Irene Limbo. Recommends Bactrim for an additional week. She has completed atypical coverage. She has been afebrile and clinically improving  Oxygen-dependent COPD with acute exacerbation and acute on chronic respiratory failure: slow to improve, still wheezing.  She still becomes very short of breath and desaturates on ambulation despite wearing oxygen. She was placed back on IV steroids.  Will request pulmonary consultation to see if there are any other measures that may help progress this patient.   Chronic respiratory failure   Diabetes mellitus type 2: stable. Continue sliding scale insulin, Lantus. Hold metformin while in inpatient. Hemoglobin A1c 6.7.   Ongoing smoking cigarettes: Extensively counseled on the importance of tobacco cessation.  Code Status: full code Family Communication: none Disposition Plan: discharge home once improved   Consultants:  none  Procedures:  none   HPI/Subjective: Still gets very short of breath, chest tightness and desaturates with ambulation  Objective: Filed Vitals:   01/16/13 1440  BP: 134/53  Pulse: 73  Temp: 98.2 F (36.8 C)  Resp: 20    Intake/Output Summary (Last 24 hours) at 01/16/13 1622 Last data filed at 01/16/13 1300  Gross per 24 hour  Intake    720 ml  Output      0 ml  Net    720 ml   Filed Weights   01/10/13 1202 01/10/13 1800 01/13/13 0415  Weight: 121.564 kg (268 lb) 122.6 kg (270 lb 4.5 oz) 125.6 kg (276 lb 14.4 oz)    Exam:   General:  NAD  Cardiovascular: S1, S2 RRR  Respiratory: bilateral exp wheezes  Abdomen: soft, obese, nt, bs+  Musculoskeletal: no edema b/l   Data Reviewed: Basic Metabolic Panel:  Recent Labs Lab  01/10/13 1403 01/11/13 0624 01/12/13 0640 01/14/13 0556  NA 134* 141 136 139  K 3.2* 3.8 3.8 3.6  CL 96 100 98 99  CO2 28 32 33* 32  GLUCOSE 175* 154* 129* 192*  BUN 5* 7 10 12   CREATININE 0.66 0.64 0.59 0.54  CALCIUM 9.1 9.3 8.7 8.6   Liver Function Tests:  Recent Labs Lab 01/10/13 1403  AST 16  ALT 11  ALKPHOS 51  BILITOT 0.7  PROT 7.3  ALBUMIN 3.5   No results found for this basename: LIPASE, AMYLASE,  in the last 168 hours No results found for this basename: AMMONIA,  in the last 168 hours CBC:  Recent Labs Lab 01/10/13 1403 01/11/13 0624 01/12/13 0640 01/14/13 0556  WBC 18.3* 17.0* 16.8* 12.2*  NEUTROABS 17.4*  --   --   --   HGB 10.4* 10.2* 9.9* 9.5*  HCT 34.7* 34.1* 34.1* 32.8*  MCV 74.1* 74.3* 75.6* 75.6*  PLT 246 241 259 232   Cardiac Enzymes:  Recent Labs Lab 01/10/13 1403  TROPONINI <0.30   BNP (last 3 results)  Recent Labs  02/18/12 0601 01/10/13 1403  PROBNP 485.0* 1235.0*   CBG:  Recent Labs Lab 01/15/13 1210 01/15/13 1646 01/15/13 2116 01/16/13 0756 01/16/13 1136  GLUCAP 148* 207* 270* 183* 225*    Recent Results (from the past 240 hour(s))  CULTURE, BLOOD (ROUTINE X 2)     Status: None   Collection Time    01/10/13  6:20 PM      Result Value Range Status   Specimen Description BLOOD RIGHT  HAND   Final   Special Requests BOTTLES DRAWN AEROBIC AND ANAEROBIC [facility] EACH   Final   Culture NO GROWTH 5 DAYS   Final   Report Status 01/15/2013 FINAL   Final  CULTURE, BLOOD (ROUTINE X 2)     Status: None   Collection Time    01/10/13  6:30 PM      Result Value Range Status   Specimen Description BLOOD LEFT ARM   Final   Special Requests BOTTLES DRAWN AEROBIC AND ANAEROBIC 8CC EACH   Final   Culture NO GROWTH 5 DAYS   Final   Report Status 01/15/2013 FINAL   Final  CULTURE, EXPECTORATED SPUTUM-ASSESSMENT     Status: None   Collection Time    01/11/13  8:30 PM      Result Value Range Status   Specimen Description SPUTUM    Final   Special Requests NONE   Final   Sputum evaluation     Final   Value: THIS SPECIMEN IS ACCEPTABLE. RESPIRATORY CULTURE REPORT TO FOLLOW.   Report Status 01/11/2013 FINAL   Final  CULTURE, RESPIRATORY (NON-EXPECTORATED)     Status: None   Collection Time    01/11/13  8:30 PM      Result Value Range Status   Specimen Description SPUTUM   Final   Special Requests NONE   Final   Gram Stain     Final   Value: MODERATE WBC PRESENT, PREDOMINANTLY PMN     FEW SQUAMOUS EPITHELIAL CELLS PRESENT     RARE GRAM POSITIVE COCCI     IN PAIRS IN CLUSTERS     Performed at [facility]   Culture     Final   Value: MODERATE METHICILLIN RESISTANT STAPHYLOCOCCUS AUREUS     Note: RIFAMPIN AND GENTAMICIN SHOULD NOT BE USED AS SINGLE DRUGS FOR TREATMENT OF STAPH INFECTIONS. CRITICAL RESULT CALLED TO, READ BACK BY AND VERIFIED WITH: MEGAN B@7 :42AM ON 01/14/13 BY DANTS     Performed at [facility]   Report Status 01/14/2013 FINAL   Final   Organism ID, Bacteria METHICILLIN RESISTANT STAPHYLOCOCCUS AUREUS   Final     Studies: No results found.  Scheduled Meds: . albuterol  2.5 mg Nebulization Q4H WA  . chlorpheniramine-HYDROcodone  5 mL Oral Q12H  . citalopram  40 mg Oral q morning - 10a  . enoxaparin (LOVENOX) injection  40 mg Subcutaneous Q24H  . fluticasone  2 spray Each Nare Daily  . guaiFENesin  600 mg Oral BID  . insulin aspart  0-5 Units Subcutaneous QHS  . insulin aspart  0-9 Units Subcutaneous TID WC  . insulin glargine  5 Units Subcutaneous QHS  . ipratropium  0.5 mg Nebulization Q4H WA  . irbesartan  37.5 mg Oral Daily  . methylPREDNISolone (SOLU-MEDROL) injection  60 mg Intravenous Q8H  . nicotine  21 mg Transdermal Daily  . pantoprazole  40 mg Oral Daily  . pregabalin  75 mg Oral BID  . sodium chloride  3 mL Intravenous Q12H  . sulfamethoxazole-trimethoprim  2 tablet Oral Q12H   Continuous Infusions:   Principal Problem:   MRSA pneumonia Active Problems:    HTN (hypertension)   DM type 2 (diabetes mellitus, type 2)   Tobacco abuse   Depression   Morbid obesity   Peripheral neuropathy   Oxygen dependent    Time spent:    [facility]  [facility] Pager [phone]. If 7PM-7AM, please contact night-coverage at [email or web address], password [facility]  01/16/2013, 4:22 PM  LOS: 6 days

## 2013-01-16 NOTE — Progress Notes (Signed)
Pt ambulated in hallway today. Pt's sats dropped to 85% on 3L [place]. At rest her sats returned to the 90s. Will continue to monitor, MD is aware.

## 2013-01-16 NOTE — Progress Notes (Signed)
UR Chart Review Completed  

## 2013-01-16 NOTE — Progress Notes (Signed)
Consult requested by: [facility] Consult requested for pneumonia:  HPI: This is a 47 year old with a known history of COPD in who came to the emergency department with multifocal community acquired pneumonia which is turned out to be MRSA. She says she's better but she is still congested short of breath and wheezing. She's coughing up some sputum but it's difficult.  Past Medical History  Diagnosis Date  . Diabetes mellitus   . COPD (chronic obstructive pulmonary disease)   . HTN (hypertension)   . Low back pain   . Tachycardia     never had test done since no insurance  . Depression   . Asthma   . Shortness of breath   . Anxiety   . Gastric erosions     EGD 08/2010.  . Internal hemorrhoids     Colonoscopy 5/12.  [name] Heavy menses   . Chronic respiratory failure with hypoxia     On 2-3 L of oxygen at home  . GERD (gastroesophageal reflux disease)   . Anemia   . Arthritis      Family History  Problem Relation Age of Onset  . Heart attack Father 48    deceased, etoh use  . Heart disease Father   . Heart attack Mother 80    deceased  . Diabetes Mother   . Breast cancer Mother   . Heart failure Mother     oxygen dependence, nonsmoker  . Heart disease Mother   . Depression Mother   . Cancer Mother   . Colon cancer Neg Hx   . Liver disease Maternal Aunt 40    died while on liver transplant list  . Heart attack Maternal Grandmother     premature CAD  . Ulcers Sister   . Hypertension Sister      History   Social History  . Marital Status: Single    Spouse Name: N/A    Number of Children: 2  . Years of Education: N/A   Occupational History  . unemployed    Social History Main Topics  . Smoking status: Current Every Day Smoker -- 0.50 packs/day for 30 years    Types: Cigarettes  . Smokeless tobacco: Never Used  . Alcohol Use: Yes     Comment: social use  . Drug Use: No  . Sexual Activity: No   Other Topics Concern  . None   Social History  Narrative  . None     ROS: She denies hemoptysis. She's not having chest pain. She has not had any fever.    Objective: Vital signs in last 24 hours: Temp:  [97.5 F (36.4 C)-98.3 F (36.8 C)] 98.2 F (36.8 C) (09/25 1440) Pulse Rate:  [69-75] 73 (09/25 1440) Resp:  [20] 20 (09/25 1440) BP: (134-152)/(53-86) 134/53 mmHg (09/25 1440) SpO2:  [90 %-96 %] 95 % (09/25 1540) Weight change:  Last BM Date: 01/14/13  Intake/Output from previous day: 09/24 0701 - 09/25 0700 In: 960 [P.O.:960] Out: -   PHYSICAL EXAM She is obese female who is in no acute distress. She is coughing. She has rhonchi bilaterally. Her heart is regular without gallop. Her abdomen is soft. Extremities showed no edema. Central nervous system exam is grossly  Lab Results: Basic Metabolic Panel:  Recent Labs  96/04/54 0556  NA 139  K 3.6  CL 99  CO2 32  GLUCOSE 192*  BUN 12  CREATININE 0.54  CALCIUM 8.6   Liver Function Tests: No results found for this basename:  AST, ALT, ALKPHOS, BILITOT, PROT, ALBUMIN,  in the last 72 hours No results found for this basename: LIPASE, AMYLASE,  in the last 72 hours No results found for this basename: AMMONIA,  in the last 72 hours CBC:  Recent Labs  01/14/13 0556  WBC 12.2*  HGB 9.5*  HCT 32.8*  MCV 75.6*  PLT 232   Cardiac Enzymes: No results found for this basename: CKTOTAL, CKMB, CKMBINDEX, TROPONINI,  in the last 72 hours BNP: No results found for this basename: PROBNP,  in the last 72 hours D-Dimer: No results found for this basename: DDIMER,  in the last 72 hours CBG:  Recent Labs  01/15/13 1210 01/15/13 1646 01/15/13 2116 01/16/13 0756 01/16/13 1136 01/16/13 1633  GLUCAP 148* 207* 270* 183* 225* 220*   Hemoglobin A1C: No results found for this basename: HGBA1C,  in the last 72 hours Fasting Lipid Panel: No results found for this basename: CHOL, HDL, LDLCALC, TRIG, CHOLHDL, LDLDIRECT,  in the last 72 hours Thyroid Function Tests: No  results found for this basename: TSH, T4TOTAL, FREET4, T3FREE, THYROIDAB,  in the last 72 hours Anemia Panel: No results found for this basename: VITAMINB12, FOLATE, FERRITIN, TIBC, IRON, RETICCTPCT,  in the last 72 hours Coagulation: No results found for this basename: LABPROT, INR,  in the last 72 hours Urine Drug Screen: Drugs of Abuse     Component Value Date/Time   LABOPIA NONE DETECTED 01/23/2012 1823   COCAINSCRNUR NONE DETECTED 01/23/2012 1823   LABBENZ NONE DETECTED 01/23/2012 1823   AMPHETMU NONE DETECTED 01/23/2012 1823   THCU POSITIVE* 01/23/2012 1823   LABBARB NONE DETECTED 01/23/2012 1823    Alcohol Level: No results found for this basename: ETH,  in the last 72 hours Urinalysis: No results found for this basename: COLORURINE, APPERANCEUR, LABSPEC, PHURINE, GLUCOSEU, HGBUR, BILIRUBINUR, KETONESUR, PROTEINUR, UROBILINOGEN, NITRITE, LEUKOCYTESUR,  in the last 72 hours Misc. Labs:   ABGS: No results found for this basename: PHART, PCO2, PO2ART, TCO2, HCO3,  in the last 72 hours   MICROBIOLOGY: Recent Results (from the past 240 hour(s))  CULTURE, BLOOD (ROUTINE X 2)     Status: None   Collection Time    01/10/13  6:20 PM      Result Value Range Status   Specimen Description BLOOD RIGHT HAND   Final   Special Requests BOTTLES DRAWN AEROBIC AND ANAEROBIC 6CC EACH   Final   Culture NO GROWTH 5 DAYS   Final   Report Status 01/15/2013 FINAL   Final  CULTURE, BLOOD (ROUTINE X 2)     Status: None   Collection Time    01/10/13  6:30 PM      Result Value Range Status   Specimen Description BLOOD LEFT ARM   Final   Special Requests BOTTLES DRAWN AEROBIC AND ANAEROBIC 8CC EACH   Final   Culture NO GROWTH 5 DAYS   Final   Report Status 01/15/2013 FINAL   Final  CULTURE, EXPECTORATED SPUTUM-ASSESSMENT     Status: None   Collection Time    01/11/13  8:30 PM      Result Value Range Status   Specimen Description SPUTUM   Final   Special Requests NONE   Final   Sputum evaluation      Final   Value: THIS SPECIMEN IS ACCEPTABLE. RESPIRATORY CULTURE REPORT TO FOLLOW.   Report Status 01/11/2013 FINAL   Final  CULTURE, RESPIRATORY (NON-EXPECTORATED)     Status: None   Collection Time  01/11/13  8:30 PM      Result Value Range Status   Specimen Description SPUTUM   Final   Special Requests NONE   Final   Gram Stain     Final   Value: MODERATE WBC PRESENT, PREDOMINANTLY PMN     FEW SQUAMOUS EPITHELIAL CELLS PRESENT     RARE GRAM POSITIVE COCCI     IN PAIRS IN CLUSTERS     Performed at [facility]   Culture     Final   Value: MODERATE METHICILLIN RESISTANT STAPHYLOCOCCUS AUREUS     Note: RIFAMPIN AND GENTAMICIN SHOULD NOT BE USED AS SINGLE DRUGS FOR TREATMENT OF STAPH INFECTIONS. CRITICAL RESULT CALLED TO, READ BACK BY AND VERIFIED WITH: MEGAN B@7 :42AM ON 01/14/13 BY DANTS     Performed at [facility]   Report Status 01/14/2013 FINAL   Final   Organism ID, Bacteria METHICILLIN RESISTANT STAPHYLOCOCCUS AUREUS   Final    Studies/Results: No results found.  Medications:  Prior to Admission:  Prescriptions prior to admission  Medication Sig Dispense Refill  . albuterol (PROVENTIL HFA) 108 (90 BASE) MCG/ACT inhaler Inhale 2 puffs into the lungs every 4 (four) hours as needed for wheezing or shortness of breath. Wheezing/shortnes of breath  1 Inhaler  3  . albuterol (PROVENTIL) (5 MG/ML) 0.5% nebulizer solution Take 2.5 mg by nebulization every 6 (six) hours as needed for wheezing.      . citalopram (CELEXA) 40 MG tablet Take 1 tablet (40 mg total) by mouth every morning.  30 tablet  6  . gabapentin (NEURONTIN) 300 MG capsule Take 300-600 mg by mouth 3 (three) times daily. Take 1 tab twice daily  and 2 at bedtime      . ibuprofen (ADVIL,MOTRIN) 200 MG tablet Take 200-400 mg by mouth every 6 (six) hours as needed for pain. For pain      . loperamide (IMODIUM A-D) 2 MG tablet Take 2 mg by mouth daily as needed for diarrhea or loose stools.      .  metFORMIN (GLUCOPHAGE) 500 MG tablet Take 1 tablet (500 mg total) by mouth 2 (two) times daily with a meal.  60 tablet  3  . mometasone-formoterol (DULERA) 100-5 MCG/ACT AERO Inhale 2 puffs into the lungs 2 (two) times daily as needed. For shortness of breath      . omeprazole (PRILOSEC) 20 MG capsule Take 20 mg by mouth every morning.      [name] oxyCODONE-acetaminophen (PERCOCET) 7.5-325 MG per tablet Take 1 tablet by mouth 3 (three) times daily as needed for pain.  90 tablet  0  . Pseudoephedrine HCl (PSEUDO PO) Take 2 tablets by mouth daily as needed (congestion).      . Pseudoephedrine-Guaifenesin (MUCINEX D) [phone] MG TB12 Take 1 tablet by mouth daily as needed (cough).      . valsartan (DIOVAN) 80 MG tablet Take 0.5 tablets (40 mg total) by mouth daily.  30 tablet  6  . pregabalin (LYRICA) 75 MG capsule Take 1 capsule (75 mg total) by mouth 2 (two) times daily.  60 capsule  3   Scheduled: . albuterol  2.5 mg Nebulization Q4H WA  . chlorpheniramine-HYDROcodone  5 mL Oral Q12H  . citalopram  40 mg Oral q morning - 10a  . enoxaparin (LOVENOX) injection  40 mg Subcutaneous Q24H  . fluticasone  2 spray Each Nare Daily  . guaiFENesin  600 mg Oral BID  . insulin aspart  0-5 Units Subcutaneous QHS  .  insulin aspart  0-9 Units Subcutaneous TID WC  . insulin glargine  5 Units Subcutaneous QHS  . ipratropium  0.5 mg Nebulization Q4H WA  . irbesartan  37.5 mg Oral Daily  . methylPREDNISolone (SOLU-MEDROL) injection  60 mg Intravenous Q8H  . nicotine  21 mg Transdermal Daily  . pantoprazole  40 mg Oral Daily  . pregabalin  75 mg Oral BID  . sodium chloride  3 mL Intravenous Q12H  . sulfamethoxazole-trimethoprim  2 tablet Oral Q12H   Continuous:  YNW:GNFAOZ chloride, albuterol, guaiFENesin-dextromethorphan, ibuprofen, morphine injection, sodium chloride  Assesment: She has MRSA pneumonia. I think she's getting better but it is very slow probably because of her severe COPD. I think is on  appropriate treatment and the only thing that I added was a flutter valve Principal Problem:   MRSA pneumonia Active Problems:   HTN (hypertension)   DM type 2 (diabetes mellitus, type 2)   Tobacco abuse   Depression   Morbid obesity   Peripheral neuropathy   Oxygen dependent    Plan: She will be on a flutter valve.    LOS: 6 days   Cherron Blitzer L 01/16/2013, 6:17 PM

## 2013-01-16 NOTE — Progress Notes (Signed)
Inpatient Diabetes Program Recommendations  AACE/ADA: New Consensus Statement on Inpatient Glycemic Control (2013)  Target Ranges:  Prepandial:   less than 140 mg/dL      Peak postprandial:   less than 180 mg/dL (1-2 hours)      Critically ill patients:  140 - 180 mg/dL   While on high dose steroid therapy, please consider the following for glucose control.  Inpatient Diabetes Program Recommendations Correction (SSI): xxxxxxxxxxx Insulin - Meal Coverage: While on high dose steroid therapy, please consider adding novolog meal coverage of 3 units tidwc in addition to correction scale.  Thank you, Lenor Coffin, RN, CNS, Diabetes Coordinator [phone])

## 2013-01-17 LAB — GLUCOSE, CAPILLARY
Glucose-Capillary: 166 mg/dL — ABNORMAL HIGH (ref 70–99)
Glucose-Capillary: 227 mg/dL — ABNORMAL HIGH (ref 70–99)
Glucose-Capillary: 253 mg/dL — ABNORMAL HIGH (ref 70–99)
Glucose-Capillary: 270 mg/dL — ABNORMAL HIGH (ref 70–99)

## 2013-01-17 LAB — CREATININE, SERUM
Creatinine, Ser: 0.67 mg/dL (ref 0.50–1.10)
GFR calc Af Amer: >90 mL/min (ref >90)
GFR calc non Af Amer: >90 mL/min (ref >90)

## 2013-01-17 MED ORDER — INSULIN GLARGINE 100 UNIT/ML ~~LOC~~ SOLN
10.0000 [IU] | Freq: Every day | SUBCUTANEOUS | Status: DC
  Administered 2013-01-17: 10 [IU] via SUBCUTANEOUS
  Filled 2013-01-17 (×3): qty 0.1

## 2013-01-17 MED ORDER — INSULIN ASPART 100 UNIT/ML ~~LOC~~ SOLN
0.0000 [IU] | Freq: Three times a day (TID) | SUBCUTANEOUS | Status: DC
  Administered 2013-01-17: 8 [IU] via SUBCUTANEOUS
  Administered 2013-01-18 (×2): 3 [IU] via SUBCUTANEOUS
  Administered 2013-01-18: 8 [IU] via SUBCUTANEOUS
  Administered 2013-01-19 (×3): 3 [IU] via SUBCUTANEOUS
  Administered 2013-01-20: 11 [IU] via SUBCUTANEOUS
  Administered 2013-01-20: 8 [IU] via SUBCUTANEOUS

## 2013-01-17 MED ORDER — INSULIN ASPART 100 UNIT/ML ~~LOC~~ SOLN
0.0000 [IU] | Freq: Every day | SUBCUTANEOUS | Status: DC
  Administered 2013-01-17 – 2013-01-19 (×3): 3 [IU] via SUBCUTANEOUS

## 2013-01-17 NOTE — Progress Notes (Signed)
[facility] PROGRESS NOTE  [name] ZOX:[id number] DOB: 1965/07/15 DOA: 01/10/2013 PCP: Milinda Antis, MD   Code Status: Full code Family Communication: Family not available Disposition Plan: Home when medically and clinically appropriate.   Consultants:  Pulmonologist, Dr. Juanetta Gosling  Procedures:  None  Antibiotics:  Oral Bactrim  HPI/Subjective: The patient says that she's feeling a little better, but still has shortness of breath with little ambulation.  Objective: Filed Vitals:   01/17/13 0500  BP: 133/71  Pulse: 73  Temp: 97.6 F (36.4 C)  Resp: 17    Intake/Output Summary (Last 24 hours) at 01/17/13 1350 Last data filed at 01/16/13 1833  Gross per 24 hour  Intake    320 ml  Output      0 ml  Net    320 ml   Filed Weights   01/10/13 1202 01/10/13 1800 01/13/13 0415  Weight: 121.564 kg (268 lb) 122.6 kg (270 lb 4.5 oz) 125.6 kg (276 lb 14.4 oz)    Exam:   General:  Obese 47 year old Caucasian woman sitting up in bed, in no acute distress.  Cardiovascular: S1, S2, with a soft systolic murmur.  Respiratory: Bilateral wheezes and occasional crackles. Breathing is nonlabored.  Abdomen: Obese, positive bowel sounds, soft, nontender, nondistended.  Musculoskeletal: No acute hot red joints. Pedal pulses palpable.   Data Reviewed: Basic Metabolic Panel:  Recent Labs Lab 01/10/13 1403 01/11/13 0624 01/12/13 0640 01/14/13 0556 01/17/13 0431  NA 134* 141 136 139  --   K 3.2* 3.8 3.8 3.6  --   CL 96 100 98 99  --   CO2 28 32 33* 32  --   GLUCOSE 175* 154* 129* 192*  --   BUN 5* 7 10 12   --   CREATININE 0.66 0.64 0.59 0.54 0.67  CALCIUM 9.1 9.3 8.7 8.6  --    Liver Function Tests:  Recent Labs Lab 01/10/13 1403  AST 16  ALT 11  ALKPHOS 51  BILITOT 0.7  PROT 7.3  ALBUMIN 3.5   No results found for this basename: LIPASE, AMYLASE,  in the last 168 hours No results found for this basename: AMMONIA,  in the last 168  hours CBC:  Recent Labs Lab 01/10/13 1403 01/11/13 0624 01/12/13 0640 01/14/13 0556  WBC 18.3* 17.0* 16.8* 12.2*  NEUTROABS 17.4*  --   --   --   HGB 10.4* 10.2* 9.9* 9.5*  HCT 34.7* 34.1* 34.1* 32.8*  MCV 74.1* 74.3* 75.6* 75.6*  PLT 246 241 259 232   Cardiac Enzymes:  Recent Labs Lab 01/10/13 1403  TROPONINI <0.30   BNP (last 3 results)  Recent Labs  02/18/12 0601 01/10/13 1403  PROBNP 485.0* 1235.0*   CBG:  Recent Labs Lab 01/16/13 1136 01/16/13 1633 01/16/13 2041 01/17/13 0723 01/17/13 1148  GLUCAP 225* 220* 284* 166* 227*    Recent Results (from the past 240 hour(s))  CULTURE, BLOOD (ROUTINE X 2)     Status: None   Collection Time    01/10/13  6:20 PM      Result Value Range Status   Specimen Description BLOOD RIGHT HAND   Final   Special Requests BOTTLES DRAWN AEROBIC AND ANAEROBIC [facility] EACH   Final   Culture NO GROWTH 5 DAYS   Final   Report Status 01/15/2013 FINAL   Final  CULTURE, BLOOD (ROUTINE X 2)     Status: None   Collection Time    01/10/13  6:30 PM  Result Value Range Status   Specimen Description BLOOD LEFT ARM   Final   Special Requests BOTTLES DRAWN AEROBIC AND ANAEROBIC 8CC EACH   Final   Culture NO GROWTH 5 DAYS   Final   Report Status 01/15/2013 FINAL   Final  CULTURE, EXPECTORATED SPUTUM-ASSESSMENT     Status: None   Collection Time    01/11/13  8:30 PM      Result Value Range Status   Specimen Description SPUTUM   Final   Special Requests NONE   Final   Sputum evaluation     Final   Value: THIS SPECIMEN IS ACCEPTABLE. RESPIRATORY CULTURE REPORT TO FOLLOW.   Report Status 01/11/2013 FINAL   Final  CULTURE, RESPIRATORY (NON-EXPECTORATED)     Status: None   Collection Time    01/11/13  8:30 PM      Result Value Range Status   Specimen Description SPUTUM   Final   Special Requests NONE   Final   Gram Stain     Final   Value: MODERATE WBC PRESENT, PREDOMINANTLY PMN     FEW SQUAMOUS EPITHELIAL CELLS PRESENT     RARE  GRAM POSITIVE COCCI     IN PAIRS IN CLUSTERS     Performed at [facility]   Culture     Final   Value: MODERATE METHICILLIN RESISTANT STAPHYLOCOCCUS AUREUS     Note: RIFAMPIN AND GENTAMICIN SHOULD NOT BE USED AS SINGLE DRUGS FOR TREATMENT OF STAPH INFECTIONS. CRITICAL RESULT CALLED TO, READ BACK BY AND VERIFIED WITH: MEGAN B@7 :42AM ON 01/14/13 BY DANTS     Performed at [facility]   Report Status 01/14/2013 FINAL   Final   Organism ID, Bacteria METHICILLIN RESISTANT STAPHYLOCOCCUS AUREUS   Final     Studies: No results found.  Scheduled Meds: . albuterol  2.5 mg Nebulization Q4H WA  . chlorpheniramine-HYDROcodone  5 mL Oral Q12H  . citalopram  40 mg Oral q morning - 10a  . enoxaparin (LOVENOX) injection  40 mg Subcutaneous Q24H  . fluticasone  2 spray Each Nare Daily  . guaiFENesin  600 mg Oral BID  . insulin aspart  0-5 Units Subcutaneous QHS  . insulin aspart  0-9 Units Subcutaneous TID WC  . insulin glargine  5 Units Subcutaneous QHS  . ipratropium  0.5 mg Nebulization Q4H WA  . irbesartan  37.5 mg Oral Daily  . methylPREDNISolone (SOLU-MEDROL) injection  60 mg Intravenous Q8H  . nicotine  21 mg Transdermal Daily  . pantoprazole  40 mg Oral Daily  . pregabalin  75 mg Oral BID  . sodium chloride  3 mL Intravenous Q12H  . sulfamethoxazole-trimethoprim  2 tablet Oral Q12H   Continuous Infusions:   Assessment:  Principal Problem:   MRSA pneumonia Active Problems:   HTN (hypertension)   DM type 2 (diabetes mellitus, type 2)   Tobacco abuse   Depression   Morbid obesity   Peripheral neuropathy   Oxygen dependent   1. Multifocal community acquired pneumonia secondary to MRSA. We'll continue Bactrim DS. The findings were discussed with infectious diseases physician, Dr. Dorothe Pea, by Dr. Irene Limbo. He recommended Bactrim for an additional week. She has completed atypical coverage. She is afebrile, but improving slowly.  Oxygen-dependent COPD with acute  exacerbation and acute on chronic respiratory failure. She continues to have bronchospasms. She was placed back on IV steroids. We'll continue bronchodilator therapy and oxygen. Dr. Juanetta Gosling has evaluated the patient and made recommendations. Appreciate input.  Type 2 diabetes mellitus. Glycemic control not optimal on IV steroids. Metformin is currently on hold. We'll titrate up Lantus and sliding-scale NovoLog.  Hypertension. Currently controlled. Continue ARB.  Tobacco abuse. The patient was strongly advised to stop smoking. She has been counseled extensively.     Plan: 1. Increase Lantus to 10 mg and increase sliding scale NovoLog to moderate scale. 2. Patient was encouraged to ambulate in the room as much as tolerated. 3. Flutter valve added per Dr. Juanetta Gosling.   Time spent: 30 minutes    [facility]  [facility] Pager [phone]. If 7PM-7AM, please contact night-coverage at [email or web address], password [facility] 01/17/2013, 1:50 PM  LOS: 7 days

## 2013-01-17 NOTE — Progress Notes (Signed)
Inpatient Diabetes Program Recommendations  AACE/ADA: New Consensus Statement on Inpatient Glycemic Control (2013)  Target Ranges:  Prepandial:   less than 140 mg/dL      Peak postprandial:   less than 180 mg/dL (1-2 hours)      Critically ill patients:  140 - 180 mg/dL   Please consider adding meal coverage only at this time: 3-4 units Novolog  Inpatient Diabetes Program Recommendations Correction (SSI): xxxxxxxxxxx Insulin - Meal Coverage: While on high dose steroid therapy, please consider adding novolog meal coverage of 3 units tidwc in addition to correction scale.  Thank you, Lenor Coffin, RN, CNS, Diabetes Coordinator [phone])

## 2013-01-17 NOTE — Progress Notes (Signed)
Subjective: She says she feels a little bit better. She is not having as much trouble with cough. She is still short of breath with exertion  Objective: Vital signs in last 24 hours: Temp:  [97.6 F (36.4 C)-98.2 F (36.8 C)] 97.6 F (36.4 C) (09/26 0500) Pulse Rate:  [73] 73 (09/26 0500) Resp:  [17-20] 17 (09/26 0500) BP: (132-134)/(53-74) 133/71 mmHg (09/26 0500) SpO2:  [90 %-97 %] 97 % (09/26 0656) Weight change:  Last BM Date: 01/16/13  Intake/Output from previous day: 09/25 0701 - 09/26 0700 In: 800 [P.O.:800] Out: -   PHYSICAL EXAM General appearance: alert, cooperative, mild distress and morbidly obese Resp: wheezes End expiratory bilaterally Cardio: regular rate and rhythm, S1, S2 normal, no murmur, click, rub or gallop GI: soft, non-tender; bowel sounds normal; no masses,  no organomegaly Extremities: extremities normal, atraumatic, no cyanosis or edema  Lab Results:    Basic Metabolic Panel:  Recent Labs  16/10/96 0431  CREATININE 0.67   Liver Function Tests: No results found for this basename: AST, ALT, ALKPHOS, BILITOT, PROT, ALBUMIN,  in the last 72 hours No results found for this basename: LIPASE, AMYLASE,  in the last 72 hours No results found for this basename: AMMONIA,  in the last 72 hours CBC: No results found for this basename: WBC, NEUTROABS, HGB, HCT, MCV, PLT,  in the last 72 hours Cardiac Enzymes: No results found for this basename: CKTOTAL, CKMB, CKMBINDEX, TROPONINI,  in the last 72 hours BNP: No results found for this basename: PROBNP,  in the last 72 hours D-Dimer: No results found for this basename: DDIMER,  in the last 72 hours CBG:  Recent Labs  01/15/13 2116 01/16/13 0756 01/16/13 1136 01/16/13 1633 01/16/13 2041 01/17/13 0723  GLUCAP 270* 183* 225* 220* 284* 166*   Hemoglobin A1C: No results found for this basename: HGBA1C,  in the last 72 hours Fasting Lipid Panel: No results found for this basename: CHOL, HDL,  LDLCALC, TRIG, CHOLHDL, LDLDIRECT,  in the last 72 hours Thyroid Function Tests: No results found for this basename: TSH, T4TOTAL, FREET4, T3FREE, THYROIDAB,  in the last 72 hours Anemia Panel: No results found for this basename: VITAMINB12, FOLATE, FERRITIN, TIBC, IRON, RETICCTPCT,  in the last 72 hours Coagulation: No results found for this basename: LABPROT, INR,  in the last 72 hours Urine Drug Screen: Drugs of Abuse     Component Value Date/Time   LABOPIA NONE DETECTED 01/23/2012 1823   COCAINSCRNUR NONE DETECTED 01/23/2012 1823   LABBENZ NONE DETECTED 01/23/2012 1823   AMPHETMU NONE DETECTED 01/23/2012 1823   THCU POSITIVE* 01/23/2012 1823   LABBARB NONE DETECTED 01/23/2012 1823    Alcohol Level: No results found for this basename: ETH,  in the last 72 hours Urinalysis: No results found for this basename: COLORURINE, APPERANCEUR, LABSPEC, PHURINE, GLUCOSEU, HGBUR, BILIRUBINUR, KETONESUR, PROTEINUR, UROBILINOGEN, NITRITE, LEUKOCYTESUR,  in the last 72 hours Misc. Labs:  ABGS No results found for this basename: PHART, PCO2, PO2ART, TCO2, HCO3,  in the last 72 hours CULTURES Recent Results (from the past 240 hour(s))  CULTURE, BLOOD (ROUTINE X 2)     Status: None   Collection Time    01/10/13  6:20 PM      Result Value Range Status   Specimen Description BLOOD RIGHT HAND   Final   Special Requests BOTTLES DRAWN AEROBIC AND ANAEROBIC [facility] EACH   Final   Culture NO GROWTH 5 DAYS   Final   Report Status 01/15/2013 FINAL  Final  CULTURE, BLOOD (ROUTINE X 2)     Status: None   Collection Time    01/10/13  6:30 PM      Result Value Range Status   Specimen Description BLOOD LEFT ARM   Final   Special Requests BOTTLES DRAWN AEROBIC AND ANAEROBIC 8CC EACH   Final   Culture NO GROWTH 5 DAYS   Final   Report Status 01/15/2013 FINAL   Final  CULTURE, EXPECTORATED SPUTUM-ASSESSMENT     Status: None   Collection Time    01/11/13  8:30 PM      Result Value Range Status   Specimen  Description SPUTUM   Final   Special Requests NONE   Final   Sputum evaluation     Final   Value: THIS SPECIMEN IS ACCEPTABLE. RESPIRATORY CULTURE REPORT TO FOLLOW.   Report Status 01/11/2013 FINAL   Final  CULTURE, RESPIRATORY (NON-EXPECTORATED)     Status: None   Collection Time    01/11/13  8:30 PM      Result Value Range Status   Specimen Description SPUTUM   Final   Special Requests NONE   Final   Gram Stain     Final   Value: MODERATE WBC PRESENT, PREDOMINANTLY PMN     FEW SQUAMOUS EPITHELIAL CELLS PRESENT     RARE GRAM POSITIVE COCCI     IN PAIRS IN CLUSTERS     Performed at [facility]   Culture     Final   Value: MODERATE METHICILLIN RESISTANT STAPHYLOCOCCUS AUREUS     Note: RIFAMPIN AND GENTAMICIN SHOULD NOT BE USED AS SINGLE DRUGS FOR TREATMENT OF STAPH INFECTIONS. CRITICAL RESULT CALLED TO, READ BACK BY AND VERIFIED WITH: MEGAN B@7 :42AM ON 01/14/13 BY DANTS     Performed at [facility]   Report Status 01/14/2013 FINAL   Final   Organism ID, Bacteria METHICILLIN RESISTANT STAPHYLOCOCCUS AUREUS   Final   Studies/Results: No results found.  Medications:  Prior to Admission:  Prescriptions prior to admission  Medication Sig Dispense Refill  . albuterol (PROVENTIL HFA) 108 (90 BASE) MCG/ACT inhaler Inhale 2 puffs into the lungs every 4 (four) hours as needed for wheezing or shortness of breath. Wheezing/shortnes of breath  1 Inhaler  3  . albuterol (PROVENTIL) (5 MG/ML) 0.5% nebulizer solution Take 2.5 mg by nebulization every 6 (six) hours as needed for wheezing.      . citalopram (CELEXA) 40 MG tablet Take 1 tablet (40 mg total) by mouth every morning.  30 tablet  6  . gabapentin (NEURONTIN) 300 MG capsule Take 300-600 mg by mouth 3 (three) times daily. Take 1 tab twice daily  and 2 at bedtime      . ibuprofen (ADVIL,MOTRIN) 200 MG tablet Take 200-400 mg by mouth every 6 (six) hours as needed for pain. For pain      . loperamide (IMODIUM A-D) 2 MG tablet  Take 2 mg by mouth daily as needed for diarrhea or loose stools.      . metFORMIN (GLUCOPHAGE) 500 MG tablet Take 1 tablet (500 mg total) by mouth 2 (two) times daily with a meal.  60 tablet  3  . mometasone-formoterol (DULERA) 100-5 MCG/ACT AERO Inhale 2 puffs into the lungs 2 (two) times daily as needed. For shortness of breath      . omeprazole (PRILOSEC) 20 MG capsule Take 20 mg by mouth every morning.      [name] oxyCODONE-acetaminophen (PERCOCET) 7.5-325 MG per tablet  Take 1 tablet by mouth 3 (three) times daily as needed for pain.  90 tablet  0  . Pseudoephedrine HCl (PSEUDO PO) Take 2 tablets by mouth daily as needed (congestion).      . Pseudoephedrine-Guaifenesin (MUCINEX D) [id number] MG TB12 Take 1 tablet by mouth daily as needed (cough).      . valsartan (DIOVAN) 80 MG tablet Take 0.5 tablets (40 mg total) by mouth daily.  30 tablet  6  . pregabalin (LYRICA) 75 MG capsule Take 1 capsule (75 mg total) by mouth 2 (two) times daily.  60 capsule  3   Scheduled: . albuterol  2.5 mg Nebulization Q4H WA  . chlorpheniramine-HYDROcodone  5 mL Oral Q12H  . citalopram  40 mg Oral q morning - 10a  . enoxaparin (LOVENOX) injection  40 mg Subcutaneous Q24H  . fluticasone  2 spray Each Nare Daily  . guaiFENesin  600 mg Oral BID  . insulin aspart  0-5 Units Subcutaneous QHS  . insulin aspart  0-9 Units Subcutaneous TID WC  . insulin glargine  5 Units Subcutaneous QHS  . ipratropium  0.5 mg Nebulization Q4H WA  . irbesartan  37.5 mg Oral Daily  . methylPREDNISolone (SOLU-MEDROL) injection  60 mg Intravenous Q8H  . nicotine  21 mg Transdermal Daily  . pantoprazole  40 mg Oral Daily  . pregabalin  75 mg Oral BID  . sodium chloride  3 mL Intravenous Q12H  . sulfamethoxazole-trimethoprim  2 tablet Oral Q12H   Continuous:  XBJ:[id number] chloride, albuterol, guaiFENesin-dextromethorphan, ibuprofen, morphine injection, sodium chloride  Assesment: She is admitted with MRSA pneumonia. She seems to be  getting better but it is slow. She has severe COPD. Principal Problem:   MRSA pneumonia Active Problems:   HTN (hypertension)   DM type 2 (diabetes mellitus, type 2)   Tobacco abuse   Depression   Morbid obesity   Peripheral neuropathy   Oxygen dependent    Plan: Continue current medications. No other new treatments right now    LOS: 7 days   Hadlie Gipson L 01/17/2013, 8:30 AM

## 2013-01-17 NOTE — Progress Notes (Signed)
Patient ambulated hallways and tolerated a little more today.  Stats stayed at 91& except for dropping to 88% just briefly after ambulating approx 100 ft. Still has some audible wheezing with exertion

## 2013-01-18 DIAGNOSIS — E875 Hyperkalemia: Secondary | ICD-10-CM

## 2013-01-18 LAB — BASIC METABOLIC PANEL
BUN: 13 mg/dL (ref 6–23)
CO2: 36 mEq/L — ABNORMAL HIGH (ref 19–32)
Calcium: 9.3 mg/dL (ref 8.4–10.5)
Chloride: 94 mEq/L — ABNORMAL LOW (ref 96–112)
Creatinine, Ser: 0.73 mg/dL (ref 0.50–1.10)
GFR calc Af Amer: >90 mL/min (ref >90)
GFR calc non Af Amer: >90 mL/min (ref >90)
Glucose, Bld: 191 mg/dL — ABNORMAL HIGH (ref 70–99)
Potassium: 5.4 mEq/L — ABNORMAL HIGH (ref 3.5–5.1)
Sodium: 134 mEq/L — ABNORMAL LOW (ref 135–145)

## 2013-01-18 LAB — GLUCOSE, CAPILLARY
Glucose-Capillary: 186 mg/dL — ABNORMAL HIGH (ref 70–99)
Glucose-Capillary: 280 mg/dL — ABNORMAL HIGH (ref 70–99)
Glucose-Capillary: 195 mg/dL — ABNORMAL HIGH (ref 70–99)
Glucose-Capillary: 291 mg/dL — ABNORMAL HIGH (ref 70–99)

## 2013-01-18 LAB — CBC
HCT: 37.9 % (ref 36.0–46.0)
Hemoglobin: 11.2 g/dL — ABNORMAL LOW (ref 12.0–15.0)
MCH: 21.7 pg — ABNORMAL LOW (ref 26.0–34.0)
MCHC: 29.6 g/dL — ABNORMAL LOW (ref 30.0–36.0)
MCV: 73.4 fL — ABNORMAL LOW (ref 78.0–100.0)
Platelets: 362 10*3/uL (ref 150–400)
RBC: 5.16 MIL/uL — ABNORMAL HIGH (ref 3.87–5.11)
RDW: 17.2 % — ABNORMAL HIGH (ref 11.5–15.5)
WBC: 22 10*3/uL — ABNORMAL HIGH (ref 4.0–10.5)

## 2013-01-18 MED ORDER — INSULIN GLARGINE 100 UNIT/ML ~~LOC~~ SOLN
17.0000 [IU] | Freq: Every day | SUBCUTANEOUS | Status: DC
  Administered 2013-01-18 – 2013-01-19 (×2): 17 [IU] via SUBCUTANEOUS
  Filled 2013-01-18 (×3): qty 0.17

## 2013-01-18 MED ORDER — OXYCODONE-ACETAMINOPHEN 5-325 MG PO TABS
1.0000 | ORAL_TABLET | ORAL | Status: DC | PRN
  Administered 2013-01-18 – 2013-01-20 (×12): 1 via ORAL
  Filled 2013-01-18 (×13): qty 1

## 2013-01-18 MED ORDER — MAGIC MOUTHWASH
15.0000 mL | Freq: Four times a day (QID) | ORAL | Status: DC | PRN
  Administered 2013-01-18 – 2013-01-20 (×5): 15 mL via ORAL
  Filled 2013-01-18 (×6): qty 15

## 2013-01-18 MED ORDER — FUROSEMIDE 20 MG PO TABS
10.0000 mg | ORAL_TABLET | Freq: Once | ORAL | Status: AC
  Administered 2013-01-18: 10 mg via ORAL
  Filled 2013-01-18: qty 1

## 2013-01-18 MED ORDER — SODIUM POLYSTYRENE SULFONATE 15 GM/60ML PO SUSP
7.5000 g | Freq: Once | ORAL | Status: AC
  Administered 2013-01-18: 7.5 g via ORAL
  Filled 2013-01-18: qty 60

## 2013-01-18 MED ORDER — SODIUM POLYSTYRENE SULFONATE 15 GM/60ML PO SUSP: 15.0000 g | Freq: Once | ORAL | Status: DC

## 2013-01-18 MED ORDER — SODIUM CHLORIDE 0.45 % IV SOLN
INTRAVENOUS | Status: DC
  Administered 2013-01-18: 14:00:00 via INTRAVENOUS

## 2013-01-18 NOTE — Progress Notes (Signed)
[facility] PROGRESS NOTE  [name] HYQ:[id number] DOB: August 14, 1965 DOA: 01/10/2013 PCP: Milinda Antis, MD   Code Status: Full code Family Communication: Family not available Disposition Plan: Home when medically and clinically appropriate.   Consultants:  Pulmonologist, Dr. Juanetta Gosling  Procedures:  None  Antibiotics:  Oral Bactrim  HPI/Subjective: The patient says that she's feeling a little better. She ambulated a couple of times yesterday with some shortness of breath, but not as much. She still has chest congestion and is having difficulty expectorating.  Objective: Filed Vitals:   01/18/13 0443  BP: 128/66  Pulse: 60  Temp: 97.8 F (36.6 C)  Resp: 20    Intake/Output Summary (Last 24 hours) at 01/18/13 1250 Last data filed at 01/18/13 0600  Gross per 24 hour  Intake    930 ml  Output      0 ml  Net    930 ml   Filed Weights   01/10/13 1800 01/13/13 0415 01/18/13 0500  Weight: 122.6 kg (270 lb 4.5 oz) 125.6 kg (276 lb 14.4 oz) 122.8 kg (270 lb 11.6 oz)    Exam:   General:  Obese 47 year old Caucasian woman sitting up in bed, in no acute distress.  Cardiovascular: S1, S2, with a soft systolic murmur.  Respiratory: Bilateral wheezes and occasional crackles. Breathing is nonlabored.  Abdomen: Obese, positive bowel sounds, soft, nontender, nondistended.  Musculoskeletal: No acute hot red joints. Pedal pulses palpable.   Data Reviewed: Basic Metabolic Panel:  Recent Labs Lab 01/12/13 0640 01/14/13 0556 01/17/13 0431 01/18/13 0633  NA 136 139  --  134*  K 3.8 3.6  --  5.4*  CL 98 99  --  94*  CO2 33* 32  --  36*  GLUCOSE 129* 192*  --  191*  BUN 10 12  --  13  CREATININE 0.59 0.54 0.67 0.73  CALCIUM 8.7 8.6  --  9.3   Liver Function Tests: No results found for this basename: AST, ALT, ALKPHOS, BILITOT, PROT, ALBUMIN,  in the last 168 hours No results found for this basename: LIPASE, AMYLASE,  in the last 168 hours No  results found for this basename: AMMONIA,  in the last 168 hours CBC:  Recent Labs Lab 01/12/13 0640 01/14/13 0556 01/18/13 0633  WBC 16.8* 12.2* 22.0*  HGB 9.9* 9.5* 11.2*  HCT 34.1* 32.8* 37.9  MCV 75.6* 75.6* 73.4*  PLT 259 232 362   Cardiac Enzymes: No results found for this basename: CKTOTAL, CKMB, CKMBINDEX, TROPONINI,  in the last 168 hours BNP (last 3 results)  Recent Labs  02/18/12 0601 01/10/13 1403  PROBNP 485.0* 1235.0*   CBG:  Recent Labs Lab 01/17/13 1148 01/17/13 1629 01/17/13 2031 01/18/13 0739 01/18/13 1152  GLUCAP 227* 253* 270* 186* 280*    Recent Results (from the past 240 hour(s))  CULTURE, BLOOD (ROUTINE X 2)     Status: None   Collection Time    01/10/13  6:20 PM      Result Value Range Status   Specimen Description BLOOD RIGHT HAND   Final   Special Requests BOTTLES DRAWN AEROBIC AND ANAEROBIC [facility] EACH   Final   Culture NO GROWTH 5 DAYS   Final   Report Status 01/15/2013 FINAL   Final  CULTURE, BLOOD (ROUTINE X 2)     Status: None   Collection Time    01/10/13  6:30 PM      Result Value Range Status   Specimen Description BLOOD LEFT ARM  Final   Special Requests BOTTLES DRAWN AEROBIC AND ANAEROBIC 8CC EACH   Final   Culture NO GROWTH 5 DAYS   Final   Report Status 01/15/2013 FINAL   Final  CULTURE, EXPECTORATED SPUTUM-ASSESSMENT     Status: None   Collection Time    01/11/13  8:30 PM      Result Value Range Status   Specimen Description SPUTUM   Final   Special Requests NONE   Final   Sputum evaluation     Final   Value: THIS SPECIMEN IS ACCEPTABLE. RESPIRATORY CULTURE REPORT TO FOLLOW.   Report Status 01/11/2013 FINAL   Final  CULTURE, RESPIRATORY (NON-EXPECTORATED)     Status: None   Collection Time    01/11/13  8:30 PM      Result Value Range Status   Specimen Description SPUTUM   Final   Special Requests NONE   Final   Gram Stain     Final   Value: MODERATE WBC PRESENT, PREDOMINANTLY PMN     FEW SQUAMOUS EPITHELIAL  CELLS PRESENT     RARE GRAM POSITIVE COCCI     IN PAIRS IN CLUSTERS     Performed at [facility]   Culture     Final   Value: MODERATE METHICILLIN RESISTANT STAPHYLOCOCCUS AUREUS     Note: RIFAMPIN AND GENTAMICIN SHOULD NOT BE USED AS SINGLE DRUGS FOR TREATMENT OF STAPH INFECTIONS. CRITICAL RESULT CALLED TO, READ BACK BY AND VERIFIED WITH: MEGAN B@7 :42AM ON 01/14/13 BY DANTS     Performed at [facility]   Report Status 01/14/2013 FINAL   Final   Organism ID, Bacteria METHICILLIN RESISTANT STAPHYLOCOCCUS AUREUS   Final     Studies: No results found.  Scheduled Meds: . albuterol  2.5 mg Nebulization Q4H WA  . chlorpheniramine-HYDROcodone  5 mL Oral Q12H  . citalopram  40 mg Oral q morning - 10a  . enoxaparin (LOVENOX) injection  40 mg Subcutaneous Q24H  . fluticasone  2 spray Each Nare Daily  . guaiFENesin  600 mg Oral BID  . insulin aspart  0-15 Units Subcutaneous TID WC  . insulin aspart  0-5 Units Subcutaneous QHS  . insulin glargine  10 Units Subcutaneous QHS  . ipratropium  0.5 mg Nebulization Q4H WA  . irbesartan  37.5 mg Oral Daily  . methylPREDNISolone (SOLU-MEDROL) injection  60 mg Intravenous Q8H  . nicotine  21 mg Transdermal Daily  . pantoprazole  40 mg Oral Daily  . pregabalin  75 mg Oral BID  . sodium chloride  3 mL Intravenous Q12H  . sulfamethoxazole-trimethoprim  2 tablet Oral Q12H   Continuous Infusions:   Assessment:  Principal Problem:   MRSA pneumonia Active Problems:   HTN (hypertension)   DM type 2 (diabetes mellitus, type 2)   Tobacco abuse   Depression   Morbid obesity   Peripheral neuropathy   Oxygen dependent   1. Multifocal community acquired pneumonia secondary to MRSA. We'll continue Bactrim DS. The findings were discussed with infectious diseases physician, Dr.Comer, by Dr. Irene Limbo. He recommended Bactrim for an additional week. She has completed atypical coverage. She is afebrile, but improving  slowly.  Oxygen-dependent COPD with acute exacerbation and acute on chronic respiratory failure. She is tolerating ambulation some now, but she continues to have bronchospasms. She was placed back on IV steroids. We'll continue bronchodilator therapy and oxygen. Dr. Juanetta Gosling has evaluated the patient and made recommendations, which included adding a flutter valve.  Type 2 diabetes  mellitus. Glycemic control not optimal on IV steroids. Metformin is currently on hold. Lantus and sliding-scale NovoLog titrated up yesterday.  Hypertension. Currently controlled. Continue ARB.  Tobacco abuse. The patient was strongly advised to stop smoking. She has been counseled extensively.  Leukocytosis. Mostly steroid induced.  Hyperkalemia. The etiology may be multifactorial including mild volume depletion, Bactrim, and ARB.    Plan: 1. Hold ARB. Oral Lasix x1. Start gentle IV fluids with half-normal saline. Give Kayexalate x1. 2. Follow serum potassium closely while she is on Bactrim. 3. Titrate up Lantus again. 4. Consider titrating down IV Solu-Medrol tomorrow. 5. The patient was encouraged to continue increasing activity as tolerated.     Time spent: 30 minutes    [facility]  [facility] Pager [phone]. If 7PM-7AM, please contact night-coverage at [email or web address], password [facility] 01/18/2013, 12:50 PM  LOS: 8 days

## 2013-01-19 LAB — GLUCOSE, CAPILLARY
Glucose-Capillary: 182 mg/dL — ABNORMAL HIGH (ref 70–99)
Glucose-Capillary: 196 mg/dL — ABNORMAL HIGH (ref 70–99)
Glucose-Capillary: 189 mg/dL — ABNORMAL HIGH (ref 70–99)
Glucose-Capillary: 276 mg/dL — ABNORMAL HIGH (ref 70–99)

## 2013-01-19 LAB — BASIC METABOLIC PANEL
BUN: 15 mg/dL (ref 6–23)
CO2: 32 mEq/L (ref 19–32)
Calcium: 8.7 mg/dL (ref 8.4–10.5)
Chloride: 93 mEq/L — ABNORMAL LOW (ref 96–112)
Creatinine, Ser: 0.6 mg/dL (ref 0.50–1.10)
GFR calc Af Amer: >90 mL/min (ref >90)
GFR calc non Af Amer: >90 mL/min (ref >90)
Glucose, Bld: 209 mg/dL — ABNORMAL HIGH (ref 70–99)
Potassium: 4.3 mEq/L (ref 3.5–5.1)
Sodium: 131 mEq/L — ABNORMAL LOW (ref 135–145)

## 2013-01-19 NOTE — Progress Notes (Signed)
[facility] PROGRESS NOTE  MAKENZEY NANNI WUJ:[id number] DOB: 09-11-1965 DOA: 01/10/2013 PCP: Milinda Antis, MD   Code Status: Full code Family Communication: Family not available Disposition Plan: Home when medically and clinically appropriate. Anticipate discharge for tomorrow as I feel that she is likely approaching her baseline.   Consultants:  Pulmonologist, Dr. Juanetta Gosling  Procedures:  None  Antibiotics:  Oral Bactrim  HPI/Subjective: Still gets short of breath/wheezing and chest tightness on ambulation, but overall feels better.  Objective: Filed Vitals:   01/19/13 0709  BP: 111/49  Pulse: 75  Temp: 98 F (36.7 C)  Resp: 20    Intake/Output Summary (Last 24 hours) at 01/19/13 1201 Last data filed at 01/18/13 1800  Gross per 24 hour  Intake 531.25 ml  Output      0 ml  Net 531.25 ml   Filed Weights   01/13/13 0415 01/18/13 0500 01/19/13 0709  Weight: 125.6 kg (276 lb 14.4 oz) 122.8 kg (270 lb 11.6 oz) 122.9 kg (270 lb 15.1 oz)    Exam:   General:  Obese 47 year old Caucasian woman sitting up in bed, in no acute distress.  Cardiovascular: S1, S2, with a soft systolic murmur.  Respiratory: Bilateral wheezes and occasional crackles. Breathing is nonlabored. Improving.  Abdomen: Obese, positive bowel sounds, soft, nontender, nondistended.  Musculoskeletal: No acute hot red joints. Pedal pulses palpable.   Data Reviewed: Basic Metabolic Panel:  Recent Labs Lab 01/14/13 0556 01/17/13 0431 01/18/13 0633 01/19/13 0533  NA 139  --  134* 131*  K 3.6  --  5.4* 4.3  CL 99  --  94* 93*  CO2 32  --  36* 32  GLUCOSE 192*  --  191* 209*  BUN 12  --  13 15  CREATININE 0.54 0.67 0.73 0.60  CALCIUM 8.6  --  9.3 8.7   Liver Function Tests: No results found for this basename: AST, ALT, ALKPHOS, BILITOT, PROT, ALBUMIN,  in the last 168 hours No results found for this basename: LIPASE, AMYLASE,  in the last 168 hours No results found for this  basename: AMMONIA,  in the last 168 hours CBC:  Recent Labs Lab 01/14/13 0556 01/18/13 0633  WBC 12.2* 22.0*  HGB 9.5* 11.2*  HCT 32.8* 37.9  MCV 75.6* 73.4*  PLT 232 362   Cardiac Enzymes: No results found for this basename: CKTOTAL, CKMB, CKMBINDEX, TROPONINI,  in the last 168 hours BNP (last 3 results)  Recent Labs  02/18/12 0601 01/10/13 1403  PROBNP 485.0* 1235.0*   CBG:  Recent Labs Lab 01/18/13 1152 01/18/13 1657 01/18/13 2123 01/19/13 0743 01/19/13 1145  GLUCAP 280* 195* 291* 182* 196*    Recent Results (from the past 240 hour(s))  CULTURE, BLOOD (ROUTINE X 2)     Status: None   Collection Time    01/10/13  6:20 PM      Result Value Range Status   Specimen Description BLOOD RIGHT HAND   Final   Special Requests BOTTLES DRAWN AEROBIC AND ANAEROBIC 6CC EACH   Final   Culture NO GROWTH 5 DAYS   Final   Report Status 01/15/2013 FINAL   Final  CULTURE, BLOOD (ROUTINE X 2)     Status: None   Collection Time    01/10/13  6:30 PM      Result Value Range Status   Specimen Description BLOOD LEFT ARM   Final   Special Requests BOTTLES DRAWN AEROBIC AND ANAEROBIC [facility] EACH   Final   Culture  NO GROWTH 5 DAYS   Final   Report Status 01/15/2013 FINAL   Final  CULTURE, EXPECTORATED SPUTUM-ASSESSMENT     Status: None   Collection Time    01/11/13  8:30 PM      Result Value Range Status   Specimen Description SPUTUM   Final   Special Requests NONE   Final   Sputum evaluation     Final   Value: THIS SPECIMEN IS ACCEPTABLE. RESPIRATORY CULTURE REPORT TO FOLLOW.   Report Status 01/11/2013 FINAL   Final  CULTURE, RESPIRATORY (NON-EXPECTORATED)     Status: None   Collection Time    01/11/13  8:30 PM      Result Value Range Status   Specimen Description SPUTUM   Final   Special Requests NONE   Final   Gram Stain     Final   Value: MODERATE WBC PRESENT, PREDOMINANTLY PMN     FEW SQUAMOUS EPITHELIAL CELLS PRESENT     RARE GRAM POSITIVE COCCI     IN PAIRS IN  CLUSTERS     Performed at [facility]   Culture     Final   Value: MODERATE METHICILLIN RESISTANT STAPHYLOCOCCUS AUREUS     Note: RIFAMPIN AND GENTAMICIN SHOULD NOT BE USED AS SINGLE DRUGS FOR TREATMENT OF STAPH INFECTIONS. CRITICAL RESULT CALLED TO, READ BACK BY AND VERIFIED WITH: MEGAN B@7 :42AM ON 01/14/13 BY DANTS     Performed at [facility]   Report Status 01/14/2013 FINAL   Final   Organism ID, Bacteria METHICILLIN RESISTANT STAPHYLOCOCCUS AUREUS   Final     Studies: No results found.  Scheduled Meds: . albuterol  2.5 mg Nebulization Q4H WA  . chlorpheniramine-HYDROcodone  5 mL Oral Q12H  . citalopram  40 mg Oral q morning - 10a  . enoxaparin (LOVENOX) injection  40 mg Subcutaneous Q24H  . fluticasone  2 spray Each Nare Daily  . guaiFENesin  600 mg Oral BID  . insulin aspart  0-15 Units Subcutaneous TID WC  . insulin aspart  0-5 Units Subcutaneous QHS  . insulin glargine  17 Units Subcutaneous QHS  . ipratropium  0.5 mg Nebulization Q4H WA  . methylPREDNISolone (SOLU-MEDROL) injection  60 mg Intravenous Q8H  . nicotine  21 mg Transdermal Daily  . pantoprazole  40 mg Oral Daily  . pregabalin  75 mg Oral BID  . sodium chloride  3 mL Intravenous Q12H  . sulfamethoxazole-trimethoprim  2 tablet Oral Q12H   Continuous Infusions:   Assessment:  Principal Problem:   MRSA pneumonia Active Problems:   HTN (hypertension)   DM type 2 (diabetes mellitus, type 2)   Tobacco abuse   Depression   Morbid obesity   Peripheral neuropathy   Oxygen dependent   Hyperkalemia   1. Multifocal community acquired pneumonia secondary to MRSA. We'll continue Bactrim DS. The findings were discussed with infectious diseases physician, Dr.Comer, by Dr. Irene Limbo. He recommended Bactrim for an additional week. She has completed atypical coverage. She is afebrile, but improving slowly.  Oxygen-dependent COPD with acute exacerbation and acute on chronic respiratory failure. She is  tolerating ambulation some now, but she continues to have bronchospasms. She was placed back on IV steroids. We'll continue bronchodilator therapy and oxygen. Dr. Juanetta Gosling has evaluated the patient and made recommendations, which included adding a flutter valve. No change in therapy today.  Type 2 diabetes mellitus. Glycemic control not optimal on IV steroids. Metformin is currently on hold. Lantus and sliding-scale NovoLog titrated  up.  Hypertension. Currently controlled.  Tobacco abuse. The patient was strongly advised to stop smoking. She has been counseled extensively.  Leukocytosis. Mostly steroid induced.  Hyperkalemia. The etiology may be multifactorial including mild volume depletion, Bactrim, and ARB.  ARB has been held and hyperkalemia has resolved.    Plan: 1. Hold ARB. Oral Lasix x1. Start gentle IV fluids with half-normal saline. Give Kayexalate x1. 2. Follow serum potassium closely while she is on Bactrim. 3. Titrate up Lantus again. 4. Consider titrating down IV Solu-Medrol tomorrow. 5. The patient was encouraged to continue increasing activity as tolerated.     Time spent: 30 minutes    [facility]  [facility] Pager [phone]. If 7PM-7AM, please contact night-coverage at [email or web address], password [facility] 01/19/2013, 12:01 PM  LOS: 9 days

## 2013-01-20 LAB — BASIC METABOLIC PANEL
BUN: 15 mg/dL (ref 6–23)
CO2: 34 mEq/L — ABNORMAL HIGH (ref 19–32)
Calcium: 9 mg/dL (ref 8.4–10.5)
Chloride: 94 mEq/L — ABNORMAL LOW (ref 96–112)
Creatinine, Ser: 0.62 mg/dL (ref 0.50–1.10)
GFR calc Af Amer: >90 mL/min (ref >90)
GFR calc non Af Amer: >90 mL/min (ref >90)
Glucose, Bld: 272 mg/dL — ABNORMAL HIGH (ref 70–99)
Potassium: 5 mEq/L (ref 3.5–5.1)
Sodium: 135 mEq/L (ref 135–145)

## 2013-01-20 LAB — GLUCOSE, CAPILLARY
Glucose-Capillary: 212 mg/dL — ABNORMAL HIGH (ref 70–99)
Glucose-Capillary: 312 mg/dL — ABNORMAL HIGH (ref 70–99)

## 2013-01-20 MED ORDER — MAGIC MOUTHWASH: 15.0000 mL | Freq: Three times a day (TID) | ORAL | Status: DC | PRN

## 2013-01-20 MED ORDER — PREDNISONE 10 MG PO TABS: ORAL_TABLET | ORAL | Status: DC

## 2013-01-20 MED ORDER — ALBUTEROL SULFATE (5 MG/ML) 0.5% IN NEBU: 2.5000 mg | INHALATION_SOLUTION | RESPIRATORY_TRACT | Status: DC | PRN

## 2013-01-20 MED ORDER — HYDROCOD POLST-CHLORPHEN POLST 10-8 MG/5ML PO LQCR: 5.0000 mL | Freq: Two times a day (BID) | ORAL | Status: DC | PRN

## 2013-01-20 NOTE — Progress Notes (Signed)
Subjective: She says she feels much better. Her breathing is pretty good. She has no new complaints.  Objective: Vital signs in last 24 hours: Temp:  [98 F (36.7 C)-98.3 F (36.8 C)] 98.1 F (36.7 C) (09/29 0650) Pulse Rate:  [73-92] 73 (09/29 0650) Resp:  [20] 20 (09/29 0650) BP: (112-122)/(49-65) 112/49 mmHg (09/29 0650) SpO2:  [96 %-98 %] 98 % (09/29 0654) Weight change:  Last BM Date: 01/19/13  Intake/Output from previous day: 09/28 0701 - 09/29 0700 In: 960 [P.O.:720; I.V.:240] Out: -   PHYSICAL EXAM General appearance: alert, cooperative, no distress and morbidly obese Resp: clear to auscultation bilaterally Cardio: regular rate and rhythm, S1, S2 normal, no murmur, click, rub or gallop GI: soft, non-tender; bowel sounds normal; no masses,  no organomegaly Extremities: extremities normal, atraumatic, no cyanosis or edema  Lab Results:    Basic Metabolic Panel:  Recent Labs  16/10/96 0533 01/20/13 0511  NA 131* 135  K 4.3 5.0  CL 93* 94*  CO2 32 34*  GLUCOSE 209* 272*  BUN 15 15  CREATININE 0.60 0.62  CALCIUM 8.7 9.0   Liver Function Tests: No results found for this basename: AST, ALT, ALKPHOS, BILITOT, PROT, ALBUMIN,  in the last 72 hours No results found for this basename: LIPASE, AMYLASE,  in the last 72 hours No results found for this basename: AMMONIA,  in the last 72 hours CBC:  Recent Labs  01/18/13 0633  WBC 22.0*  HGB 11.2*  HCT 37.9  MCV 73.4*  PLT 362   Cardiac Enzymes: No results found for this basename: CKTOTAL, CKMB, CKMBINDEX, TROPONINI,  in the last 72 hours BNP: No results found for this basename: PROBNP,  in the last 72 hours D-Dimer: No results found for this basename: DDIMER,  in the last 72 hours CBG:  Recent Labs  01/18/13 1657 01/18/13 2123 01/19/13 0743 01/19/13 1145 01/19/13 1640 01/19/13 2221  GLUCAP 195* 291* 182* 196* 189* 276*   Hemoglobin A1C: No results found for this basename: HGBA1C,  in the last  72 hours Fasting Lipid Panel: No results found for this basename: CHOL, HDL, LDLCALC, TRIG, CHOLHDL, LDLDIRECT,  in the last 72 hours Thyroid Function Tests: No results found for this basename: TSH, T4TOTAL, FREET4, T3FREE, THYROIDAB,  in the last 72 hours Anemia Panel: No results found for this basename: VITAMINB12, FOLATE, FERRITIN, TIBC, IRON, RETICCTPCT,  in the last 72 hours Coagulation: No results found for this basename: LABPROT, INR,  in the last 72 hours Urine Drug Screen: Drugs of Abuse     Component Value Date/Time   LABOPIA NONE DETECTED 01/23/2012 1823   COCAINSCRNUR NONE DETECTED 01/23/2012 1823   LABBENZ NONE DETECTED 01/23/2012 1823   AMPHETMU NONE DETECTED 01/23/2012 1823   THCU POSITIVE* 01/23/2012 1823   LABBARB NONE DETECTED 01/23/2012 1823    Alcohol Level: No results found for this basename: ETH,  in the last 72 hours Urinalysis: No results found for this basename: COLORURINE, APPERANCEUR, LABSPEC, PHURINE, GLUCOSEU, HGBUR, BILIRUBINUR, KETONESUR, PROTEINUR, UROBILINOGEN, NITRITE, LEUKOCYTESUR,  in the last 72 hours Misc. Labs:  ABGS No results found for this basename: PHART, PCO2, PO2ART, TCO2, HCO3,  in the last 72 hours CULTURES Recent Results (from the past 240 hour(s))  CULTURE, BLOOD (ROUTINE X 2)     Status: None   Collection Time    01/10/13  6:20 PM      Result Value Range Status   Specimen Description BLOOD RIGHT HAND   Final  Special Requests BOTTLES DRAWN AEROBIC AND ANAEROBIC [facility] EACH   Final   Culture NO GROWTH 5 DAYS   Final   Report Status 01/15/2013 FINAL   Final  CULTURE, BLOOD (ROUTINE X 2)     Status: None   Collection Time    01/10/13  6:30 PM      Result Value Range Status   Specimen Description BLOOD LEFT ARM   Final   Special Requests BOTTLES DRAWN AEROBIC AND ANAEROBIC 8CC EACH   Final   Culture NO GROWTH 5 DAYS   Final   Report Status 01/15/2013 FINAL   Final  CULTURE, EXPECTORATED SPUTUM-ASSESSMENT     Status: None    Collection Time    01/11/13  8:30 PM      Result Value Range Status   Specimen Description SPUTUM   Final   Special Requests NONE   Final   Sputum evaluation     Final   Value: THIS SPECIMEN IS ACCEPTABLE. RESPIRATORY CULTURE REPORT TO FOLLOW.   Report Status 01/11/2013 FINAL   Final  CULTURE, RESPIRATORY (NON-EXPECTORATED)     Status: None   Collection Time    01/11/13  8:30 PM      Result Value Range Status   Specimen Description SPUTUM   Final   Special Requests NONE   Final   Gram Stain     Final   Value: MODERATE WBC PRESENT, PREDOMINANTLY PMN     FEW SQUAMOUS EPITHELIAL CELLS PRESENT     RARE GRAM POSITIVE COCCI     IN PAIRS IN CLUSTERS     Performed at [facility]   Culture     Final   Value: MODERATE METHICILLIN RESISTANT STAPHYLOCOCCUS AUREUS     Note: RIFAMPIN AND GENTAMICIN SHOULD NOT BE USED AS SINGLE DRUGS FOR TREATMENT OF STAPH INFECTIONS. CRITICAL RESULT CALLED TO, READ BACK BY AND VERIFIED WITH: MEGAN B@7 :42AM ON 01/14/13 BY DANTS     Performed at [facility]   Report Status 01/14/2013 FINAL   Final   Organism ID, Bacteria METHICILLIN RESISTANT STAPHYLOCOCCUS AUREUS   Final   Studies/Results: No results found.  Medications:  Prior to Admission:  Prescriptions prior to admission  Medication Sig Dispense Refill  . albuterol (PROVENTIL HFA) 108 (90 BASE) MCG/ACT inhaler Inhale 2 puffs into the lungs every 4 (four) hours as needed for wheezing or shortness of breath. Wheezing/shortnes of breath  1 Inhaler  3  . albuterol (PROVENTIL) (5 MG/ML) 0.5% nebulizer solution Take 2.5 mg by nebulization every 6 (six) hours as needed for wheezing.      . citalopram (CELEXA) 40 MG tablet Take 1 tablet (40 mg total) by mouth every morning.  30 tablet  6  . gabapentin (NEURONTIN) 300 MG capsule Take 300-600 mg by mouth 3 (three) times daily. Take 1 tab twice daily  and 2 at bedtime      . ibuprofen (ADVIL,MOTRIN) 200 MG tablet Take 200-400 mg by mouth every 6  (six) hours as needed for pain. For pain      . loperamide (IMODIUM A-D) 2 MG tablet Take 2 mg by mouth daily as needed for diarrhea or loose stools.      . metFORMIN (GLUCOPHAGE) 500 MG tablet Take 1 tablet (500 mg total) by mouth 2 (two) times daily with a meal.  60 tablet  3  . mometasone-formoterol (DULERA) 100-5 MCG/ACT AERO Inhale 2 puffs into the lungs 2 (two) times daily as needed. For shortness of breath      .  omeprazole (PRILOSEC) 20 MG capsule Take 20 mg by mouth every morning.      [name] oxyCODONE-acetaminophen (PERCOCET) 7.5-325 MG per tablet Take 1 tablet by mouth 3 (three) times daily as needed for pain.  90 tablet  0  . Pseudoephedrine HCl (PSEUDO PO) Take 2 tablets by mouth daily as needed (congestion).      . Pseudoephedrine-Guaifenesin (MUCINEX D) [phone] MG TB12 Take 1 tablet by mouth daily as needed (cough).      . valsartan (DIOVAN) 80 MG tablet Take 0.5 tablets (40 mg total) by mouth daily.  30 tablet  6  . pregabalin (LYRICA) 75 MG capsule Take 1 capsule (75 mg total) by mouth 2 (two) times daily.  60 capsule  3   Scheduled: . albuterol  2.5 mg Nebulization Q4H WA  . chlorpheniramine-HYDROcodone  5 mL Oral Q12H  . citalopram  40 mg Oral q morning - 10a  . enoxaparin (LOVENOX) injection  40 mg Subcutaneous Q24H  . fluticasone  2 spray Each Nare Daily  . guaiFENesin  600 mg Oral BID  . insulin aspart  0-15 Units Subcutaneous TID WC  . insulin aspart  0-5 Units Subcutaneous QHS  . insulin glargine  17 Units Subcutaneous QHS  . ipratropium  0.5 mg Nebulization Q4H WA  . methylPREDNISolone (SOLU-MEDROL) injection  60 mg Intravenous Q8H  . nicotine  21 mg Transdermal Daily  . pantoprazole  40 mg Oral Daily  . pregabalin  75 mg Oral BID  . sodium chloride  3 mL Intravenous Q12H  . sulfamethoxazole-trimethoprim  2 tablet Oral Q12H   Continuous:  VOZ:DGUYQI chloride, albuterol, guaiFENesin-dextromethorphan, ibuprofen, magic mouthwash, oxyCODONE-acetaminophen, sodium  chloride  Assesment: She was admitted with MRSA pneumonia. She has COPD. She has improved markedly. Principal Problem:   MRSA pneumonia Active Problems:   HTN (hypertension)   DM type 2 (diabetes mellitus, type 2)   Tobacco abuse   Depression   Morbid obesity   Peripheral neuropathy   Oxygen dependent   Hyperkalemia    Plan: I discussed her situation with Dr. Kerry Hough and agree that she is probably approaching baseline and will be ready for discharge same. She will need followup chest x-ray to document complete clearing of the infiltrate    LOS: 10 days   Joyceline Maiorino L 01/20/2013, 8:16 AM

## 2013-01-20 NOTE — Progress Notes (Addendum)
Nutrition Brief Note  Patient identified on related to length of stay.  Patient Active Problem List   Diagnosis Date Noted  . Hyperkalemia 01/18/2013  . MRSA pneumonia 01/14/2013  . Difficulty urinating 12/31/2012  . Leukocytosis, unspecified 12/31/2012  . Asthma with COPD 09/09/2012  . Perforated ear drum 05/22/2012  . Influenza A (H1N1) 05/07/2012  . Dizziness 03/07/2012  . Bell's palsy 01/23/2012  . Carpal tunnel syndrome 01/23/2012  . Carpal tunnel syndrome on both sides 01/11/2012  . Pulmonary infiltrates 12/22/2011  . COPD exacerbation 11/12/2011  . Chronic respiratory failure 10/16/2011  . Oxygen dependent 10/16/2011  . Peripheral neuropathy 07/07/2011  . Joint pain 07/06/2011  . Vitamin d deficiency 07/06/2011  . Asthmatic bronchitis 06/11/2011  . Back pain 06/11/2011  . Morbid obesity 05/06/2011  . Depression 05/04/2011  . Sinus tachycardia 03/21/2011  . Gastric erosions 12/05/2010  . HTN (hypertension) 12/02/2010  . DM type 2 (diabetes mellitus, type 2) 12/02/2010  . Tobacco abuse 12/02/2010  . Microcytic anemia 09/08/2010  . GERD (gastroesophageal reflux disease) 09/08/2010  . Esophageal dysphagia 09/08/2010    Wt Readings from Last 15 Encounters:  01/19/13 270 lb 15.1 oz (122.9 kg)  12/31/12 268 lb (121.564 kg)  12/09/12 270 lb (122.471 kg)  10/02/12 267 lb (121.11 kg)  09/05/12 272 lb (123.378 kg)  08/22/12 262 lb 6.4 oz (119.024 kg)  06/21/12 264 lb 0.6 oz (119.768 kg)  05/20/12 261 lb 1.3 oz (118.425 kg)  05/08/12 253 lb 8.5 oz (115 kg)  03/18/12 265 lb (120.203 kg)  03/07/12 259 lb 12.8 oz (117.845 kg)  02/20/12 275 lb 14.4 oz (125.147 kg)  02/08/12 259 lb 1.9 oz (117.536 kg)  01/23/12 261 lb 3.9 oz (118.5 kg)  01/11/12 251 lb 1.9 oz (113.907 kg)    Body mass index is 46.48 kg/(m^2). Patient meets criteria for extreme obesity class III  based on current BMI.   Current diet order is CHO Modified, patient is consuming approximately 100% of meals  at this time. Labs and medications reviewed.   No nutrition interventions warranted at this time. If nutrition issues arise, please consult RD.   Royann Shivers MS,RD,LDN Office: [phone] Pager: [phone]

## 2013-01-20 NOTE — Discharge Summary (Signed)
Physician Discharge Summary  [name] ZOX:[id number] DOB: Mar 10, 1966 DOA: 01/10/2013  PCP: Milinda Antis, MD Pulmonary: Dr. Sherene Sires  Admit date: 01/10/2013 Discharge date: 01/20/2013  Time spent: 45 minutes  Recommendations for Outpatient Follow-up:  1. Repeat chest xray in 2-3 weeks to ensure resolution of pneumonia 2. ARB was discontinued due to hypotension and hypokalemia 3. Follow up with PCP in 2 weeks 4. Follow up with pulmonologist in 2 weeks  Discharge Diagnoses:  Principal Problem:   MRSA pneumonia Active Problems:   HTN (hypertension)   DM type 2 (diabetes mellitus, type 2)   Tobacco abuse   Depression   Morbid obesity   Peripheral neuropathy   Oxygen dependent   Hyperkalemia   Discharge Condition: improved  Diet recommendation: low salt, low carb  Filed Weights   01/13/13 0415 01/18/13 0500 01/19/13 0709  Weight: 125.6 kg (276 lb 14.4 oz) 122.8 kg (270 lb 11.6 oz) 122.9 kg (270 lb 15.1 oz)    History of present illness:  [name] is a 47 y.o. female  Past medical history of obesity, tobacco abuse, CHF and COPD who for the last week has been having increased shortness of breath and persistent productive cough with yellowish sputum, who today started having worsening shortness of breath and came into the emergency room today. In the emergency room, she was noted to have a white count of 18 and a chest x-ray suggestive of volume overload. Her BNP was mildly elevated at 1235. However she was noted at 95% shift differential and a CT scan was done which noted slowly no evidence of pleural effusion but did note most the lobar pneumonia. Patient was given IV antibiotics plus steroids plus breathing treatments and continuous oxygen. Hospitalists were called for further evaluation.   Hospital Course:  Sleeves appeared to the hospital with acute on chronic shortness of breath. She has oxygen dependent COPD. She was found to have a MRSA pneumonia. This was  discussed with infectious disease that recommended oral Bactrim for one week. Patient's completed this course from hospital. She also had significant wheezing due to her COPD. She was started on IV steroids, nebulizer treatments. Her progress was slow but she does appear to be approaching baseline. Patient is able to ambulate with oxygen and feels significantly improved. She was seen by Dr. Juanetta Gosling, pulmonology. She will be placed on a prednisone taper and followup with her primary care physician as well as her pulmonologist in the next 2 weeks. She will need a chest x-ray in 2-3 weeks to ensure resolution of her pneumonia. She's been strongly advised to quit smoking.  Procedures:  none  Consultations:  Pulmonology, Dr. Juanetta Gosling  Discharge Exam: Filed Vitals:   01/20/13 0650  BP: 112/49  Pulse: 73  Temp: 98.1 F (36.7 C)  Resp: 20    General: NAD Cardiovascular: S1, S2 RRR Respiratory: cta b  Discharge Instructions  Discharge Orders   Future Appointments Provider Department Dept Phone   04/01/2013 3:15 PM Salley Scarlet, MD [facility] [phone]   Future Orders Complete By Expires   Call MD for:  difficulty breathing, headache or visual disturbances  As directed    Call MD for:  temperature >100.4  As directed    Diet - low sodium heart healthy  As directed    Diet Carb Modified  As directed    Increase activity slowly  As directed        Medication List    STOP taking these  medications       valsartan 80 MG tablet  Commonly known as:  DIOVAN      TAKE these medications       albuterol 108 (90 BASE) MCG/ACT inhaler  Commonly known as:  PROVENTIL HFA  Inhale 2 puffs into the lungs every 4 (four) hours as needed for wheezing or shortness of breath. Wheezing/shortnes of breath     albuterol (5 MG/ML) 0.5% nebulizer solution  Commonly known as:  PROVENTIL  Take 0.5 mLs (2.5 mg total) by nebulization every 4 (four) hours as needed for wheezing.      chlorpheniramine-HYDROcodone 10-8 MG/5ML Lqcr  Commonly known as:  TUSSIONEX  Take 5 mLs by mouth every 12 (twelve) hours as needed (cough).     citalopram 40 MG tablet  Commonly known as:  CELEXA  Take 1 tablet (40 mg total) by mouth every morning.     gabapentin 300 MG capsule  Commonly known as:  NEURONTIN  Take 300-600 mg by mouth 3 (three) times daily. Take 1 tab twice daily  and 2 at bedtime     ibuprofen 200 MG tablet  Commonly known as:  ADVIL,MOTRIN  Take 200-400 mg by mouth every 6 (six) hours as needed for pain. For pain     loperamide 2 MG tablet  Commonly known as:  IMODIUM A-D  Take 2 mg by mouth daily as needed for diarrhea or loose stools.     magic mouthwash Soln  Take 15 mLs by mouth 3 (three) times daily as needed.     metFORMIN 500 MG tablet  Commonly known as:  GLUCOPHAGE  Take 1 tablet (500 mg total) by mouth 2 (two) times daily with a meal.     mometasone-formoterol 100-5 MCG/ACT Aero  Commonly known as:  DULERA  Inhale 2 puffs into the lungs 2 (two) times daily as needed. For shortness of breath     MUCINEX D [phone] MG Tb12  Generic drug:  Pseudoephedrine-Guaifenesin  Take 1 tablet by mouth daily as needed (cough).     omeprazole 20 MG capsule  Commonly known as:  PRILOSEC  Take 20 mg by mouth every morning.     oxyCODONE-acetaminophen 7.5-325 MG per tablet  Commonly known as:  PERCOCET  Take 1 tablet by mouth 3 (three) times daily as needed for pain.     predniSONE 10 MG tablet  Commonly known as:  DELTASONE  Take 40mg  po daily for 3 days then 30mg  po daily for 3 days then 20mg  po daily for 3 days then 10mg  po daily for 3 days then stop     pregabalin 75 MG capsule  Commonly known as:  LYRICA  Take 1 capsule (75 mg total) by mouth 2 (two) times daily.     PSEUDO PO  Take 2 tablets by mouth daily as needed (congestion).       Allergies  Allergen Reactions  . Codeine Itching and Nausea Only  . Wellbutrin [Bupropion Hcl] Hives        Follow-up Information   Follow up with [facility]. [facility] RN)    Contact information:   [address] [place] [address] [phone]      Follow up with Milinda Antis, MD. Schedule an appointment as soon as possible for a visit in 2 weeks.   Specialty:  Family Medicine   Contact information:   [address] [place] [address] [phone]       Follow up with  Sandrea Hughs, MD. Schedule an appointment as soon as possible for a visit in 2 weeks.   Specialty:  Pulmonary Disease   Contact information:   [address] [place] [address] [phone]        The results of significant diagnostics from this hospitalization (including imaging, microbiology, ancillary and laboratory) are listed below for reference.    Significant Diagnostic Studies: Ct Chest W Contrast  01/10/2013   CLINICAL DATA:  Shortness of breast.  EXAM: CT CHEST WITH CONTRAST  TECHNIQUE: Multidetector CT imaging of the chest was performed during intravenous contrast administration.  CONTRAST:  80mL OMNIPAQUE IOHEXOL 300 MG/ML  SOLN  COMPARISON:  02/17/2012  FINDINGS: There is no pleural effusion identified. There are patchy, bilateral areas of airspace consolidation and subsegmental atelectasis involving both lungs. This is upper lobe predominant and the left upper lobe is most severely affected. There is no interstitial edema identified.  The trachea appears patent and is midline. No endobronchial lesion identified. The heart size appears normal. There is no pericardial effusion. Multiple small nonspecific pulmonary nodules are identified within the mediastinum and hilar regions. No axillary or supraclavicular adenopathy identified.  Incidental imaging through the upper abdomen shows cholecystectomy clips.  Review of the visualized osseous structures is significant for mild thoracic spondylosis.  IMPRESSION: 1. Multifocal, bilateral areas of atelectasis and airspace consolidation.  Findings are likely secondary to multifocal infection.  2. No pleural effusion or interstitial edema identified. .  3. Prior cholecystectomy.   Electronically Signed   By: Signa Kell M.D.   On: 01/10/2013 15:44   Dg Chest Port 1 View  01/10/2013   CLINICAL DATA:  Cough. Congestion. Shortness breath. COPD.  EXAM: PORTABLE CHEST - 1 VIEW  COMPARISON:  12/09/2012.  FINDINGS: Cardiomegaly. Pulmonary edema suspected. Infectious infiltrate not excluded. No gross pneumothorax.  Follow up two view examination with cardiac leads removed may prove helpful.  IMPRESSION: Cardiomegaly. Pulmonary edema suspected. Infectious infiltrate not excluded.  Please see above.   Electronically Signed   By: Bridgett Larsson   On: 01/10/2013 13:44    Microbiology: Recent Results (from the past 240 hour(s))  CULTURE, BLOOD (ROUTINE X 2)     Status: None   Collection Time    01/10/13  6:20 PM      Result Value Range Status   Specimen Description BLOOD RIGHT HAND   Final   Special Requests BOTTLES DRAWN AEROBIC AND ANAEROBIC 6CC EACH   Final   Culture NO GROWTH 5 DAYS   Final   Report Status 01/15/2013 FINAL   Final  CULTURE, BLOOD (ROUTINE X 2)     Status: None   Collection Time    01/10/13  6:30 PM      Result Value Range Status   Specimen Description BLOOD LEFT ARM   Final   Special Requests BOTTLES DRAWN AEROBIC AND ANAEROBIC 8CC EACH   Final   Culture NO GROWTH 5 DAYS   Final   Report Status 01/15/2013 FINAL   Final  CULTURE, EXPECTORATED SPUTUM-ASSESSMENT     Status: None   Collection Time    01/11/13  8:30 PM      Result Value Range Status   Specimen Description SPUTUM   Final   Special Requests NONE   Final   Sputum evaluation     Final   Value: THIS SPECIMEN IS ACCEPTABLE. RESPIRATORY CULTURE REPORT TO FOLLOW.   Report Status 01/11/2013 FINAL   Final  CULTURE, RESPIRATORY (NON-EXPECTORATED)  Status: None   Collection Time    01/11/13  8:30 PM      Result Value Range Status   Specimen Description  SPUTUM   Final   Special Requests NONE   Final   Gram Stain     Final   Value: MODERATE WBC PRESENT, PREDOMINANTLY PMN     FEW SQUAMOUS EPITHELIAL CELLS PRESENT     RARE GRAM POSITIVE COCCI     IN PAIRS IN CLUSTERS     Performed at [facility]   Culture     Final   Value: MODERATE METHICILLIN RESISTANT STAPHYLOCOCCUS AUREUS     Note: RIFAMPIN AND GENTAMICIN SHOULD NOT BE USED AS SINGLE DRUGS FOR TREATMENT OF STAPH INFECTIONS. CRITICAL RESULT CALLED TO, READ BACK BY AND VERIFIED WITH: MEGAN B@7 :42AM ON 01/14/13 BY DANTS     Performed at [facility]   Report Status 01/14/2013 FINAL   Final   Organism ID, Bacteria METHICILLIN RESISTANT STAPHYLOCOCCUS AUREUS   Final     Labs: Basic Metabolic Panel:  Recent Labs Lab 01/14/13 0556 01/17/13 0431 01/18/13 0633 01/19/13 0533 01/20/13 0511  NA 139  --  134* 131* 135  K 3.6  --  5.4* 4.3 5.0  CL 99  --  94* 93* 94*  CO2 32  --  36* 32 34*  GLUCOSE 192*  --  191* 209* 272*  BUN 12  --  13 15 15   CREATININE 0.54 0.67 0.73 0.60 0.62  CALCIUM 8.6  --  9.3 8.7 9.0   Liver Function Tests: No results found for this basename: AST, ALT, ALKPHOS, BILITOT, PROT, ALBUMIN,  in the last 168 hours No results found for this basename: LIPASE, AMYLASE,  in the last 168 hours No results found for this basename: AMMONIA,  in the last 168 hours CBC:  Recent Labs Lab 01/14/13 0556 01/18/13 0633  WBC 12.2* 22.0*  HGB 9.5* 11.2*  HCT 32.8* 37.9  MCV 75.6* 73.4*  PLT 232 362   Cardiac Enzymes: No results found for this basename: CKTOTAL, CKMB, CKMBINDEX, TROPONINI,  in the last 168 hours BNP: BNP (last 3 results)  Recent Labs  02/18/12 0601 01/10/13 1403  PROBNP 485.0* 1235.0*   CBG:  Recent Labs Lab 01/19/13 0743 01/19/13 1145 01/19/13 1640 01/19/13 2221 01/20/13 0809  GLUCAP 182* 196* 189* 276* 212*       Signed:  Newell Wafer  [facility] 01/20/2013, 10:41 AM

## 2013-01-20 NOTE — Progress Notes (Signed)
Patient received d/c instructions. Verbalizes understanding. IV cath removed. No pain/swelling at site. Awaiting daughter to pickup.

## 2013-01-21 DIAGNOSIS — J441 Chronic obstructive pulmonary disease with (acute) exacerbation: Secondary | ICD-10-CM

## 2013-01-21 DIAGNOSIS — I509 Heart failure, unspecified: Secondary | ICD-10-CM

## 2013-01-21 DIAGNOSIS — J45909 Unspecified asthma, uncomplicated: Secondary | ICD-10-CM

## 2013-01-21 DIAGNOSIS — E119 Type 2 diabetes mellitus without complications: Secondary | ICD-10-CM

## 2013-01-23 ENCOUNTER — Telehealth: Payer: Self-pay | Admitting: Family Medicine

## 2013-01-23 NOTE — Telephone Encounter (Addendum)
Flonase 0.05% spray use 2 sprays in the nose QD #16 Percocet 7.5-325 mg tab 1 TID prn

## 2013-01-24 ENCOUNTER — Ambulatory Visit (INDEPENDENT_AMBULATORY_CARE_PROVIDER_SITE_OTHER): Payer: Medicare PPO | Admitting: Family Medicine

## 2013-01-24 ENCOUNTER — Encounter: Payer: Self-pay | Admitting: Family Medicine

## 2013-01-24 VITALS — BP 110/70 | HR 78 | Temp 99.3°F | Resp 18 | Wt 266.0 lb

## 2013-01-24 DIAGNOSIS — Z720 Tobacco use: Secondary | ICD-10-CM

## 2013-01-24 DIAGNOSIS — J15212 Pneumonia due to Methicillin resistant Staphylococcus aureus: Secondary | ICD-10-CM

## 2013-01-24 DIAGNOSIS — I10 Essential (primary) hypertension: Secondary | ICD-10-CM

## 2013-01-24 DIAGNOSIS — J441 Chronic obstructive pulmonary disease with (acute) exacerbation: Secondary | ICD-10-CM

## 2013-01-24 DIAGNOSIS — F17200 Nicotine dependence, unspecified, uncomplicated: Secondary | ICD-10-CM

## 2013-01-24 MED ORDER — SULFAMETHOXAZOLE-TMP DS 800-160 MG PO TABS: 1.0000 | ORAL_TABLET | Freq: Two times a day (BID) | ORAL | Status: DC

## 2013-01-24 MED ORDER — OXYCODONE-ACETAMINOPHEN 7.5-325 MG PO TABS: 1.0000 | ORAL_TABLET | Freq: Three times a day (TID) | ORAL | Status: DC | PRN

## 2013-01-24 MED ORDER — NYSTATIN 100000 UNIT/ML MT SUSP: 500000.0000 [IU] | Freq: Four times a day (QID) | OROMUCOSAL | Status: DC

## 2013-01-24 NOTE — Telephone Encounter (Signed)
?   Ok to refill, last refill 12/27/12, pt has appt today

## 2013-01-24 NOTE — Telephone Encounter (Signed)
Pt received at office visit

## 2013-01-24 NOTE — Patient Instructions (Addendum)
Swish the nystatin Restart bactrim x 1 week Continue prednisone taper Nebulizer every 4 hours Oxygen at bedtime Schedule appt with lung doctor F/U as previous

## 2013-01-26 ENCOUNTER — Encounter: Payer: Self-pay | Admitting: Family Medicine

## 2013-01-26 NOTE — Assessment & Plan Note (Signed)
BP looks okay on meds

## 2013-01-26 NOTE — Assessment & Plan Note (Signed)
Unchanged, not ready to quit, despite harmful effects on her life and health

## 2013-01-26 NOTE — Progress Notes (Addendum)
  Subjective:    Patient ID: [name], female    DOB: [birth year], 47 y.o.   MRN: [id number]  HPI  Pt here to f/u hospital admission. Admitted due to MRSA pneumonia. Completed course of IV antibiotics and Bactrim prior to discharge. On prednisone taper now, but feels she is worsening again. COugh productive, continued wheezing though improved. Has sinus pressure and drainage and ear pain. Denies any fever or chills, but Temp has been around 46F past few days.   Restarted her BP meds 1/2 tablet of diovan and has not had an difficulty since discharge Hospital record reviewed.   Review of Systems  GEN- + fatigue, fever, weight loss,weakness, recent illness HEENT- denies eye drainage, change in vision,+ nasal discharge, CVS- denies chest pain, palpitations RESP- denies SOB, +cough, +wheeze ABD- denies N/V, change in stools, abd pain Neuro- denies headache, dizziness, syncope, seizure activity      Objective:   Physical Exam  GEN- NAD, alert and oriented x3- Room Air , HEENT- PERRL, EOMI, non injected sclera, pink conjunctiva, MMM, oropharynx clear, +maxillary sinus tenderness, Left TM clear  no effusion, Right TM chronic perforation no drainage ,nares clear Neck- Supple, no LAD CVS- RRR, no murmur RESP- few scattered wheeze,+rhonchi bilaterally,  normal WOB at rest, harsh cough,  EXT- No edema Pulses- Radial 2+        Assessment & Plan:

## 2013-01-26 NOTE — Assessment & Plan Note (Signed)
Concurrent with pneumonia, continue nebs every 4 hours, continue oxygen at bedtime , prednisone taper

## 2013-01-26 NOTE — Assessment & Plan Note (Signed)
Reviewed hospital discharge, with improvement and now worsening symptoms will put her on a repeat course of bactrim Her oxygen sat and vitals are good She is not wearing oxygen at this time F/U with pulmonary repeat CXR 2 weeks

## 2013-01-28 ENCOUNTER — Telehealth: Payer: Self-pay | Admitting: Family Medicine

## 2013-01-28 MED ORDER — FLUTICASONE PROPIONATE 50 MCG/ACT NA SUSP: 2.0000 | Freq: Every day | NASAL | Status: DC

## 2013-01-28 NOTE — Telephone Encounter (Signed)
Flonase 0.05% nasal spray

## 2013-01-28 NOTE — Telephone Encounter (Signed)
Medication refilled per protocol. 

## 2013-01-28 NOTE — Telephone Encounter (Signed)
Okay to send in 

## 2013-01-30 ENCOUNTER — Ambulatory Visit (INDEPENDENT_AMBULATORY_CARE_PROVIDER_SITE_OTHER): Payer: Medicare PPO | Admitting: Physician Assistant

## 2013-01-30 ENCOUNTER — Encounter: Payer: Self-pay | Admitting: Physician Assistant

## 2013-01-30 ENCOUNTER — Telehealth: Payer: Self-pay | Admitting: Family Medicine

## 2013-01-30 VITALS — BP 122/66 | HR 88 | Temp 98.6°F | Resp 22 | Wt 272.0 lb

## 2013-01-30 DIAGNOSIS — J449 Chronic obstructive pulmonary disease, unspecified: Secondary | ICD-10-CM

## 2013-01-30 DIAGNOSIS — J441 Chronic obstructive pulmonary disease with (acute) exacerbation: Secondary | ICD-10-CM

## 2013-01-30 DIAGNOSIS — J15212 Pneumonia due to Methicillin resistant Staphylococcus aureus: Secondary | ICD-10-CM

## 2013-01-30 DIAGNOSIS — E119 Type 2 diabetes mellitus without complications: Secondary | ICD-10-CM

## 2013-01-30 DIAGNOSIS — Z720 Tobacco use: Secondary | ICD-10-CM

## 2013-01-30 DIAGNOSIS — F17200 Nicotine dependence, unspecified, uncomplicated: Secondary | ICD-10-CM

## 2013-01-30 MED ORDER — PREDNISONE 20 MG PO TABS: ORAL_TABLET | ORAL | Status: DC

## 2013-01-30 MED ORDER — SODIUM CHLORIDE 3 % IN NEBU: INHALATION_SOLUTION | RESPIRATORY_TRACT | Status: DC | PRN

## 2013-01-30 NOTE — Progress Notes (Signed)
Patient ID: RYVER ZADROZNY MRN: [id number], DOB: [birth year], 47 y.o. Date of Encounter: @DATE @  Chief Complaint:  Chief Complaint  Patient presents with  . chest heavy, SOB    worsening since yesterday, unable to use Nebulizer because no order was sent in for NSS to mix with albuterol    HPI: 47 y.o. year old white female  Presents for followup secondary to worsened shortness of breath and cough.  She was recently hospitalized secondary to MRSA pneumonia. She completed a course of IV antibiotics and Bactrim prior to discharge. She saw Dr. Jeanice Lim in our office for followup on 01/24/13. At the time of that visit she was on a prednisone taper. She reported to Dr. Jeanice Lim that day that  she was worsening again. She had a productive cough and continued wheezing but she did think that it was somewhat improved. As well she had sinus pressure and drainage and ear pain. She denied any significant fevers or chills but did report that her temperature has been around 99. At that visit Dr. Jeanice Lim  noted  scattered wheezes, positive rhonchi bilaterally, normal work of breathing at rest, harsh cough. She placed her on a repeat course of Bactrim.  Patient reports that she has a followup appointment with pulmonary this upcoming Monday. States that nurse came to her house for evaluation today-- felt that she should come see [place] today and not wait until the pulmonary appointment Monday.  Patient reports that she decreased the prednisone to 10 mg dose yesterday. Since decreasing that she is noticing increased tightness in her lungs. Yesterday she was out doing some errands and she became short of breath and checked her oxygen saturation and got 82%. She went home and started using her oxygen and had to increase it to 4-5 L in order to maintain saturation greater than 90%. Says that anytime she walks her saturation drops. Her chest feels tight. Her cough seems to be dry at this point. She is still on the Bactrim but  only has one to 2 more days worth. Says that she used a nebulizer treatment last night, used one early this morning, used another one after lunch today.   Past Medical History  Diagnosis Date  . Diabetes mellitus   . COPD (chronic obstructive pulmonary disease)   . HTN (hypertension)   . Low back pain   . Tachycardia     never had test done since no insurance  . Depression   . Asthma   . Shortness of breath   . Anxiety   . Gastric erosions     EGD 08/2010.  . Internal hemorrhoids     Colonoscopy 5/12.  [name] Heavy menses   . Chronic respiratory failure with hypoxia     On 2-3 L of oxygen at home  . GERD (gastroesophageal reflux disease)   . Anemia   . Arthritis      Home Meds: See attached medication section for current medication list. Any medications entered into computer today will not appear on this note's list. The medications listed below were entered prior to today. Current Outpatient Prescriptions on File Prior to Visit  Medication Sig Dispense Refill  . albuterol (PROVENTIL HFA) 108 (90 BASE) MCG/ACT inhaler Inhale 2 puffs into the lungs every 4 (four) hours as needed for wheezing or shortness of breath. Wheezing/shortnes of breath  1 Inhaler  3  . albuterol (PROVENTIL) (5 MG/ML) 0.5% nebulizer solution Take 0.5 mLs (2.5 mg total) by nebulization every 4 (four)  hours as needed for wheezing.  20 mL  12  . chlorpheniramine-HYDROcodone (TUSSIONEX) 10-8 MG/5ML LQCR Take 5 mLs by mouth every 12 (twelve) hours as needed (cough).  115 mL  0  . citalopram (CELEXA) 40 MG tablet Take 1 tablet (40 mg total) by mouth every morning.  30 tablet  6  . fluticasone (FLONASE) 50 MCG/ACT nasal spray Place 2 sprays into the nose daily.  16 g  11  . gabapentin (NEURONTIN) 300 MG capsule Take 300-600 mg by mouth 3 (three) times daily. Take 1 tab twice daily  and 2 at bedtime      . ibuprofen (ADVIL,MOTRIN) 200 MG tablet Take 200-400 mg by mouth every 6 (six) hours as needed for pain. For pain       . loperamide (IMODIUM A-D) 2 MG tablet Take 2 mg by mouth daily as needed for diarrhea or loose stools.      . metFORMIN (GLUCOPHAGE) 500 MG tablet Take 1 tablet (500 mg total) by mouth 2 (two) times daily with a meal.  60 tablet  3  . mometasone-formoterol (DULERA) 100-5 MCG/ACT AERO Inhale 2 puffs into the lungs 2 (two) times daily as needed. For shortness of breath      . nystatin (MYCOSTATIN) 100000 UNIT/ML suspension Take 5 mLs (500,000 Units total) by mouth 4 (four) times daily.  180 mL  1  . omeprazole (PRILOSEC) 20 MG capsule Take 20 mg by mouth every morning.      [name] oxyCODONE-acetaminophen (PERCOCET) 7.5-325 MG per tablet Take 1 tablet by mouth 3 (three) times daily as needed for pain.  90 tablet  0  . predniSONE (DELTASONE) 10 MG tablet Take 40mg  po daily for 3 days then 30mg  po daily for 3 days then 20mg  po daily for 3 days then 10mg  po daily for 3 days then stop  30 tablet  0  . pregabalin (LYRICA) 75 MG capsule Take 1 capsule (75 mg total) by mouth 2 (two) times daily.  60 capsule  3  . sulfamethoxazole-trimethoprim (BACTRIM DS) 800-160 MG per tablet Take 1 tablet by mouth 2 (two) times daily.  14 tablet  0  . valsartan (DIOVAN) 80 MG tablet Take 40 mg by mouth daily.       No current facility-administered medications on file prior to visit.    Allergies:  Allergies  Allergen Reactions  . Codeine Itching and Nausea Only  . Wellbutrin [Bupropion Hcl] Hives    History   Social History  . Marital Status: Single    Spouse Name: N/A    Number of Children: 2  . Years of Education: N/A   Occupational History  . unemployed    Social History Main Topics  . Smoking status: Current Every Day Smoker -- 0.50 packs/day for 30 years    Types: Cigarettes  . Smokeless tobacco: Never Used     Comment: using the vapor cigarettes  . Alcohol Use: Yes     Comment: social use  . Drug Use: No  . Sexual Activity: No   Other Topics Concern  . Not on file   Social History Narrative   . No narrative on file    Family History  Problem Relation Age of Onset  . Heart attack Father 69    deceased, etoh use  . Heart disease Father   . Heart attack Mother 61    deceased  . Diabetes Mother   . Breast cancer Mother   . Heart failure Mother  oxygen dependence, nonsmoker  . Heart disease Mother   . Depression Mother   . Cancer Mother   . Colon cancer Neg Hx   . Liver disease Maternal Aunt 40    died while on liver transplant list  . Heart attack Maternal Grandmother     premature CAD  . Ulcers Sister   . Hypertension Sister      Review of Systems:  See HPI for pertinent ROS. All other ROS negative.    Physical Exam: Blood pressure 122/66, pulse 88, temperature 98.6 F (37 C), temperature source Oral, resp. rate 22, weight 272 lb (123.378 kg), SpO2 95.00%., Body mass index is 46.67 kg/(m^2). this oxygen saturation of 95% was obtained with the patient using her nasal cannula oxygen at 3 L. General: Obese white female .Appears in no acute distress. Head: Normocephalic, atraumatic, eyes without discharge, sclera non-icteric, nares are without discharge. Bilateral auditory canals clear, TM's are without perforation, pearly grey and translucent with reflective cone of light bilaterally. Oral cavity moist, posterior pharynx without exudate, erythema, peritonsillar abscess, or post nasal drip.  Neck: Supple. No thyromegaly. No lymphadenopathy. Lungs: She has mild wheezes in the upper lungs bilaterally. At bilateral bases she has increased wheezes which are harsh. Good air movement. Heart: RRR with S1 S2. No murmurs, rubs, or gallops. Musculoskeletal:  Strength and tone normal for age. Extremities/Skin: Warm and dry. No clubbing or cyanosis. No edema. No rashes or suspicious lesions. Neuro: Alert and oriented X 3. Moves all extremities spontaneously. Gait is normal. CNII-XII grossly in tact. Psych:  Responds to questions appropriately with a normal affect.      ASSESSMENT AND PLAN:  47 y.o. year old female with  1. MRSA pneumonia We'll obtain followup chest x-ray. We'll increase her prednisone. I recommended she follow up with Dr. Jeanice Lim tomorrow prior to going into the weekend. However she wants to hold off on scheduling that appointment. However she states that she will return to the clinic if she worsens or is not feeling improved by tomorrow. Otherwise we will also be following up with the patient when we obtainf x-ray results. As well she is to continue the albuterol nebulizers every 4 hours. Continue oxygen. Complete the course of Bactrim - DG Chest 2 View; Future - predniSONE (DELTASONE) 20 MG tablet; Take 3 daily for 2 days then 2 daily for 5 days then 1 daily for 3 days  Dispense: 19 tablet; Refill: 0  2. COPD exacerbation  3. Asthma with COPD  4. DM type 2 (diabetes mellitus, type 2)  5. Tobacco abuse  6. Morbid obesity   Signed, Shon [place], [place], [facility] 01/30/2013 6:42 PM

## 2013-01-30 NOTE — Telephone Encounter (Signed)
Miranda from [facility] called stating that patient is having some wheezing and SOB today and has turned up her O2 tank to 5L. Pt is coming in today to be seen to evaluate the level and wheezing.

## 2013-01-31 ENCOUNTER — Telehealth: Payer: Self-pay | Admitting: Physician Assistant

## 2013-01-31 MED ORDER — SODIUM CHLORIDE 3 % IN NEBU: INHALATION_SOLUTION | RESPIRATORY_TRACT | Status: DC | PRN

## 2013-01-31 NOTE — Telephone Encounter (Signed)
Patient was sent to [facility] for Chest X-ray . Couldn't make appointment . Could we please change locations to [facility].

## 2013-02-03 ENCOUNTER — Telehealth: Payer: Self-pay | Admitting: Internal Medicine

## 2013-02-03 ENCOUNTER — Ambulatory Visit (INDEPENDENT_AMBULATORY_CARE_PROVIDER_SITE_OTHER)
Admission: RE | Admit: 2013-02-03 | Discharge: 2013-02-03 | Disposition: A | Discharge status: Home / Self Care | Payer: Medicare PPO | Source: Ambulatory Visit | Attending: Internal Medicine | Admitting: Internal Medicine

## 2013-02-03 ENCOUNTER — Encounter: Payer: Self-pay | Admitting: Internal Medicine

## 2013-02-03 ENCOUNTER — Ambulatory Visit (INDEPENDENT_AMBULATORY_CARE_PROVIDER_SITE_OTHER): Payer: Medicare PPO | Admitting: Internal Medicine

## 2013-02-03 VITALS — BP 120/76 | HR 82 | Temp 97.8°F | Ht 64.0 in | Wt 284.0 lb

## 2013-02-03 DIAGNOSIS — J9610 Chronic respiratory failure, unspecified whether with hypoxia or hypercapnia: Secondary | ICD-10-CM

## 2013-02-03 DIAGNOSIS — R918 Other nonspecific abnormal finding of lung field: Secondary | ICD-10-CM

## 2013-02-03 DIAGNOSIS — J45909 Unspecified asthma, uncomplicated: Secondary | ICD-10-CM

## 2013-02-03 MED ORDER — OMEPRAZOLE 40 MG PO CPDR: DELAYED_RELEASE_CAPSULE | ORAL | Status: DC

## 2013-02-03 MED ORDER — OXYCODONE-ACETAMINOPHEN 7.5-325 MG PO TABS: 1.0000 | ORAL_TABLET | ORAL | Status: DC | PRN

## 2013-02-03 MED ORDER — ALBUTEROL SULFATE (2.5 MG/3ML) 0.083% IN NEBU: 2.5000 mg | INHALATION_SOLUTION | Freq: Four times a day (QID) | RESPIRATORY_TRACT | Status: DC | PRN

## 2013-02-03 MED ORDER — FAMOTIDINE 20 MG PO TABS: ORAL_TABLET | ORAL | Status: DC

## 2013-02-03 NOTE — Telephone Encounter (Signed)
I already spoke with pt about results and she will get percocet filled when it is due on 02/08/13

## 2013-02-03 NOTE — Patient Instructions (Addendum)
Plan A Dulera 100 Take 2 puffs first thing in am and then another 2 puffs about 12 hours later  Prilosec 40 mg Take 30-60 min before first meal of the day and Pepcid 20 mg one at bedtime   For breathing  Plan B Only use your albuterol as a rescue medication to be used if you can't catch your breath by resting or doing a relaxed purse lip breathing pattern.  - The less you use it, the better it will work when you need it. - Ok to use up to every 4 hours if you must but call for immediate appointment if use goes up over your usual need - Don't leave home without it !!  (think of it like your spare tire for your car)  Plan C is your nebulizer ok to use nebulizer up to every 4 hours but my hope is you won't need it at all  For cough mucinex dm 1200 mg every 12 hours supplement with percocet up to every 4 hours if needed plus the flutter valve  GERD (REFLUX)  is an extremely common cause of respiratory symptoms, many times with no significant heartburn at all.    It can be treated with medication, but also with lifestyle changes including avoidance of late meals, excessive alcohol, smoking cessation, and avoid fatty foods, chocolate, peppermint, colas, red wine, and acidic juices such as orange juice.  NO MINT OR MENTHOL PRODUCTS SO NO COUGH DROPS  USE SUGARLESS CANDY INSTEAD (jolley ranchers or Stover's)  NO OIL BASED VITAMINS - use powdered substitutes.    Please remember to go to the  x-ray department downstairs for your tests - we will call you with the results when they are available.  See Tammy NP w/in 2 weeks with all your medications, even over the counter meds, separated in two separate bags, the ones you take no matter what vs the ones you stop once you feel better and take only as needed when you feel you need them.   Tammy  will generate for you a new user friendly medication calendar that will put [place] all on the same page re: your medication use.     Once you have seen Tammy and we  are sure that we're all on the same page with your medication use she will arrange follow up with me with all medications and the med calendar in your hand so we can confirm you have all the medications you need

## 2013-02-03 NOTE — Telephone Encounter (Signed)
Pt stated this has been taken care of. She has already spoke with someone from our office. Nothing further needed

## 2013-02-03 NOTE — Progress Notes (Signed)
Quick Note:  Spoke with pt and notified of results per Dr. Wert. Pt verbalized understanding and denied any questions.  ______ 

## 2013-02-03 NOTE — Progress Notes (Signed)
Subjective:    Patient ID: [name], female    DOB: 12-Apr-1966  MRN: [id number]    Brief patient profile:  47 yowf active smoker with pna as 47 year old but then could keep up with classmates fine at PE but developed recurrent exacerbations of cough starting in 2010 and referred to pulmonary clinic 12/22/2011 by Dr Jeanice Lim p 3 admissions to [facility] since Nov 2012 with dx of pna and refractory cough and sob since then.  Spirometry normal 03/18/2012   History of Present Illness  12/22/2011 1st pulmonary eval on ACEI  cc doe x walking outside in heat but does ok indoors, very sedentary, also  bad cough when not taking cough medications coughs so hard she vomits, more productive in am thick yellow, never bloody. Not using nebs x sev weeks.  rec Prilosec Take 30-60 min before first meal of the day and Zantac 150 mg one at bedtime Stop lisinopril and start benicar 20 mg one daily (ok to take one half if too strong) - you should be able to taper off your cough medication soon  Stop advair and spiriva Dulera 100 Take 2 puffs first thing in am and then another 2 puffs about 12 hours later.  Only use your albuterol as a rescue medication  .  Please schedule a follow up office visit in 4 weeks, sooner if needed with pfts and cxr> did not return   03/18/2012 f/u ov/[name] still smoking  cc breathing better on dulera 100 2 bid and much less cough and need for albuterol , only needs hfa, not neb at all.   rec The key is to stop smoking completely before smoking completely stops you!  Change dulera to 100 2 every 12 hours when breathing is bad, not daily You do not require follow up here unless losing ground with your activity tolerance off cigarettes.   Admit Pna Jan 2014 [facility] (see notes)   Admit date: 01/10/2013  Discharge date: 01/20/2013    Recommendations for Outpatient Follow-up:  1. Repeat chest xray in 2-3 weeks to ensure resolution of pneumonia 2. ARB was discontinued due to hypotension  and hypokalemia 3. Follow up with PCP in 2 weeks 4. Follow up with pulmonologist in 2 weeks Discharge Diagnoses:  Principal Problem:  MRSA pneumonia  Active Problems:  HTN (hypertension)  DM type 2 (diabetes mellitus, type 2)  Tobacco abuse  Depression  Morbid obesity  Peripheral neuropathy  Oxygen dependent  Hyperkalemia      02/03/2013 f/u ov/[name] last smoked 01/29/13 but on 02 24h 2.5 on 40 mg pred per day  Chief Complaint  Patient presents with  . Follow-up    Pt states dxed with PNA and MRSA recently and was d/c'ed from [facility] on 01/20/13. She c/o having increased SOB, chest tightness and prod cough with very minimal yellow sputum.   using neb every 4 hours due to cough and sob with min acitvty but not much response  No obvious pattern to  day to day or daytime variabilty or assoc  cp or   subjective wheeze overt sinus or hb symptoms. No unusual exp hx or h/o childhood pna/ asthma or knowledge of premature birth.  Sleeping ok without nocturnal  or early am exacerbation  of respiratory  c/o's or need for noct saba. Also denies any obvious fluctuation of symptoms with weather or environmental changes or other aggravating or alleviating factors except as outlined above   Current Medications, Allergies, Complete Past Medical History, Past  Surgical History, Family History, and Social History were reviewed in [employer] record.  ROS  The following are not active complaints unless bolded sore throat, dysphagia, dental problems, itching, sneezing,  nasal congestion or excess/ purulent secretions, ear ache,   fever, chills, sweats, unintended wt loss, pleuritic or exertional cp, hemoptysis,  orthopnea pnd or leg swelling, presyncope, palpitations, heartburn, abdominal pain, anorexia, nausea, vomiting, diarrhea  or change in bowel or urinary habits, change in stools or urine, dysuria,hematuria,  rash, arthralgias, visual complaints, headache, numbness weakness or  ataxia or problems with walking or coordination,  change in mood/affect or memory.               Objective:   Physical Exam amb obese / slt cushingnoid wf nad with minimal  pseudowheeze  03/18/2012 wt  265 > 284 02/03/2013  Wt Readings from Last 3 Encounters:  12/22/11 259 lb 9.6 oz (117.754 kg)  11/16/11 257 lb (116.574 kg)  11/09/11 252 lb 0.8 oz (114.329 kg)     HEENT: nl dentition, turbinates, and orophanx. Nl external ear canals without cough reflex   NECK :  without JVD/Nodes/TM/ nl carotid upstrokes bilaterally   LUNGS: no acc muscle use, insp and exp rhonchi bilaterally    CV:  RRR  no s3 or murmur or increase in P2, no edema   ABD:  soft and nontender with nl excursion in the supine position. No bruits or organomegaly, bowel sounds nl  MS:  warm without deformities, calf tenderness, cyanosis or clubbing        Ct chest 02/17/12 1. No evidence of acute pulmonary embolism.  2. Diffuse ground-glass opacities with an upper lobe predominance  suggest pulmonary edema. Cannot exclude an inflammatory or  infectious process. Findings are similar to but worse than  comparison exam of 05/05/2011.    CXR  02/03/2013 :  Near-complete clearing of bilateral airspace opacities.       Assessment & Plan:

## 2013-02-04 ENCOUNTER — Encounter (HOSPITAL_COMMUNITY): Payer: Self-pay | Admitting: Family Medicine

## 2013-02-04 NOTE — Assessment & Plan Note (Signed)
Resolved by cxr 02/03/13,  ? May flare with taper off steroids suggestive of more of an inflammatory/ boop-like problem but time will tell.

## 2013-02-04 NOTE — Assessment & Plan Note (Signed)
Adequate control on present rx, reviewed > no change in rx needed  > 2lpm 24/7 for now 

## 2013-02-04 NOTE — Assessment & Plan Note (Addendum)
-   PFT's 03/18/2012 > spirometry nl   - HFA 75% p coaching 02/03/13   DDX of  difficult airways managment all start with A and  include Adherence, Ace Inhibitors, Acid Reflux, Active Sinus Disease, Alpha 1 Antitripsin deficiency, Anxiety masquerading as Airways dz,  ABPA,  allergy(esp in young), Aspiration (esp in elderly), Adverse effects of DPI,  Active smokers, plus two Bs  = Bronchiectasis and Beta blocker use..and one C= CHF   Adherence is always the initial "prime suspect" and is a multilayered concern that requires a "trust but verify" approach in every patient - starting with knowing how to use medications, especially inhalers, correctly, keeping up with refills and understanding the fundamental difference between maintenance and prns vs those medications only taken for a very short course and then stopped and not refilled. The proper method of use, as well as anticipated side effects, of a metered-dose inhaler are discussed and demonstrated to the patient. Improved effectiveness after extensive coaching during this visit to a level of approximately  75% so continue dulera 100 2bid and reviewed how to use saba appropriately to manage sob. She can use cough suppression as a separate issue and not overuse saba (ineffectively ) for cough control.   ? Acid (or non-acid) GERD > always difficult to exclude as up to 75% of pts in some series report no assoc GI/ Heartburn symptoms> rec max (24h)  acid suppression and diet restrictions/ reviewed and instructions given in writting  ?Active smoking > denies since 01/29/13 may want to confirm this next ov if symptoms remain refractory.    Each maintenance medication was reviewed in detail including most importantly the difference between maintenance and as needed and under what circumstances the prns are to be used.  Please see instructions for details which were reviewed in writing and the patient given a copy.    Next step is to confirm medication  reconciliation.  To keep things simple, I have asked the patient to first separate medicines that are perceived as maintenance, that is to be taken daily "no matter what", from those medicines that are taken on only on an as-needed basis and I have given the patient examples of both, and then return to see our NP to generate a  detailed  medication calendar which should be followed until the next physician sees the patient and updates it.

## 2013-02-17 ENCOUNTER — Encounter: Payer: Medicare PPO | Admitting: Adult Health

## 2013-02-27 ENCOUNTER — Telehealth: Payer: Self-pay | Admitting: *Deleted

## 2013-02-27 NOTE — Telephone Encounter (Signed)
Need to talk to Dr. Jeanice Lim about this patient

## 2013-02-28 NOTE — Telephone Encounter (Signed)
Please let pt know I reviewed her narcotic profile, she received 90 tablets on 10/3 , then she received 120 tablets from Dr. Sherene Sires on 10/18 ,  ( Total 210) she should not be out of medication, even if she did increase to every 4 hours as Dr. Rolin Barry prescription. She can not have pain medications refilled until  03/17/13.

## 2013-02-28 NOTE — Telephone Encounter (Signed)
Pt is aware that she can not have her pain medication until 03/17/13.

## 2013-03-17 ENCOUNTER — Telehealth: Payer: Self-pay | Admitting: Family Medicine

## 2013-03-17 MED ORDER — OXYCODONE-ACETAMINOPHEN 7.5-325 MG PO TABS: 1.0000 | ORAL_TABLET | ORAL | Status: DC | PRN

## 2013-03-17 NOTE — Telephone Encounter (Signed)
[name] is calling today because she states that Dr Jeanice Lim had told her that she could pick her Percocets up today and I did not see that up front. If someone could give her a call back at [phone]

## 2013-03-17 NOTE — Telephone Encounter (Signed)
Med printed, ready for provider signature and pt aware to pick up

## 2013-03-24 ENCOUNTER — Other Ambulatory Visit: Payer: Self-pay | Admitting: *Deleted

## 2013-03-25 ENCOUNTER — Telehealth: Payer: Self-pay | Admitting: *Deleted

## 2013-03-25 MED ORDER — LORATADINE 10 MG PO TABS: 10.0000 mg | ORAL_TABLET | Freq: Every day | ORAL | Status: DC

## 2013-03-25 NOTE — Telephone Encounter (Signed)
Med refilleed

## 2013-03-27 ENCOUNTER — Encounter: Payer: Self-pay | Admitting: Physician Assistant

## 2013-03-27 ENCOUNTER — Ambulatory Visit (INDEPENDENT_AMBULATORY_CARE_PROVIDER_SITE_OTHER): Payer: Medicare PPO | Admitting: Physician Assistant

## 2013-03-27 VITALS — BP 136/80 | HR 88 | Temp 98.5°F | Resp 20 | Wt 275.0 lb

## 2013-03-27 DIAGNOSIS — J9610 Chronic respiratory failure, unspecified whether with hypoxia or hypercapnia: Secondary | ICD-10-CM

## 2013-03-27 DIAGNOSIS — J15212 Pneumonia due to Methicillin resistant Staphylococcus aureus: Secondary | ICD-10-CM

## 2013-03-27 DIAGNOSIS — H7291 Unspecified perforation of tympanic membrane, right ear: Secondary | ICD-10-CM

## 2013-03-27 DIAGNOSIS — H7290 Unspecified perforation of tympanic membrane, unspecified ear: Secondary | ICD-10-CM

## 2013-03-27 DIAGNOSIS — J45909 Unspecified asthma, uncomplicated: Secondary | ICD-10-CM

## 2013-03-27 DIAGNOSIS — E119 Type 2 diabetes mellitus without complications: Secondary | ICD-10-CM

## 2013-03-27 DIAGNOSIS — J209 Acute bronchitis, unspecified: Secondary | ICD-10-CM

## 2013-03-27 MED ORDER — AZITHROMYCIN 250 MG PO TABS: ORAL_TABLET | ORAL | Status: DC

## 2013-03-27 MED ORDER — PREDNISONE 20 MG PO TABS: ORAL_TABLET | ORAL | Status: DC

## 2013-03-27 NOTE — Progress Notes (Signed)
Patient ID: [name] MRN: [id number], DOB: [birth year], 47 y.o. Date of Encounter: 03/27/2013, 2:54 PM    Chief Complaint:  Chief Complaint  Patient presents with  . barky cough and congestion, severe left knee pain     HPI: 47 y.o. year old obese White female reports that for the past 4 days she has had increased drainage down her throat and chest congestion. Increased cough and wheeze.  No nasal congestion or mucus. No significant sore throat. Says that she did see some drainage from her right ear this morning. Otherwise no significant earache. No known fevers or chills.     Home Meds: See attached medication section for any medications that were entered at today's visit. The computer does not put those onto this list.The following list is a list of meds entered prior to today's visit.   Current Outpatient Prescriptions on File Prior to Visit  Medication Sig Dispense Refill  . albuterol (PROVENTIL) (2.5 MG/3ML) 0.083% nebulizer solution Take 3 mLs (2.5 mg total) by nebulization every 6 (six) hours as needed for wheezing.  120 mL  12  . citalopram (CELEXA) 40 MG tablet Take 1 tablet (40 mg total) by mouth every morning.  30 tablet  6  . famotidine (PEPCID) 20 MG tablet One at bedtime  30 tablet  11  . fluticasone (FLONASE) 50 MCG/ACT nasal spray Place 2 sprays into the nose daily.  16 g  11  . ibuprofen (ADVIL,MOTRIN) 200 MG tablet Take 200-400 mg by mouth every 6 (six) hours as needed for pain. For pain      . loperamide (IMODIUM A-D) 2 MG tablet Take 2 mg by mouth daily as needed for diarrhea or loose stools.      [name] loratadine (CLARITIN) 10 MG tablet Take 1 tablet (10 mg total) by mouth daily.  30 tablet  5  . metFORMIN (GLUCOPHAGE) 500 MG tablet Take 1 tablet (500 mg total) by mouth 2 (two) times daily with a meal.  60 tablet  3  . mometasone-formoterol (DULERA) 100-5 MCG/ACT AERO Inhale 2 puffs into the lungs 2 (two) times daily. For shortness of breath      .  omeprazole (PRILOSEC) 40 MG capsule Take 30-60 min before first meal of the day  30 capsule  11  . oxyCODONE-acetaminophen (PERCOCET) 7.5-325 MG per tablet Take 1 tablet by mouth every 4 (four) hours as needed for pain.  120 tablet  0  . pregabalin (LYRICA) 75 MG capsule Take 1 capsule (75 mg total) by mouth 2 (two) times daily.  60 capsule  3  . valsartan (DIOVAN) 80 MG tablet Take 40 mg by mouth daily.       No current facility-administered medications on file prior to visit.    Allergies:  Allergies  Allergen Reactions  . Codeine Itching and Nausea Only  . Wellbutrin [Bupropion Hcl] Hives      Review of Systems: See HPI for pertinent ROS. All other ROS negative.    Physical Exam: Blood pressure 136/80, pulse 88, temperature 98.5 F (36.9 C), temperature source Oral, resp. rate 20, weight 275 lb (124.739 kg)., Body mass index is 47.18 kg/(m^2). General:  Morbidly obese white female . Appears in no acute distress. HEENT: Normocephalic, atraumatic, eyes without discharge, sclera non-icteric, nares are without discharge. Right tympanic membrane has a small perforation in the center. There is some erythema behind the membrane that the drainage behind the membrane appears clear and serous. The left ear is normal.  Oral cavity moist, posterior pharynx without exudate, erythema, peritonsillar abscess, or post nasal drip.  Neck: Supple. No thyromegaly. No lymphadenopathy. Lungs: She has some wheezes present bilaterally throughout. Pounds tight with decreased movement at the bases. Oxygen saturation is 96% on room air. Heart: Regular rhythm. No murmurs, rubs, or gallops. Abdomen: Soft, non-tender, non-distended with normoactive bowel sounds. No hepatomegaly. No rebound/guarding. No obvious abdominal masses. Msk:  Strength and tone normal for age. Extremities/Skin: Warm and dry. No clubbing or cyanosis. No edema. No rashes or suspicious lesions. Neuro: Alert and oriented X 3. Moves all  extremities spontaneously. Gait is normal. CNII-XII grossly in tact. Psych:  Responds to questions appropriately with a normal affect.     ASSESSMENT AND PLAN:  47 y.o. year old female with  1. Acute bronchitis - predniSONE (DELTASONE) 20 MG tablet; Take 3 daily for 2 days, then 2 daily for 2 days, then 1 daily for 2 days.  Dispense: 12 tablet; Refill: 0 - azithromycin (ZITHROMAX) 250 MG tablet; Day 1: Take 2 daily.  Days 2-5: Take 1 daily.  Dispense: 6 tablet; Refill: 0 Use albuterol nebulizer regularly 4 times a day for this week. Continue other pulmonary medications as usual. She says that she already has an appointment with Dr. Jeanice Lim this upcoming Tuesday. Told her to keep this appointment for followup. She is to contact us if her symptoms worsen in the interim.  2. H/OAsthmatic bronchitis   3. Chronic respiratory failure  4. MRSA pneumonia As hospitalized 01/10/13 with MRSA pneumonia. Patient states that she had returned to her baseline regarding her wheezing and cough and breathing. Therefore, I do not think that this is a continuation of her MRSA infection.  5. Morbid obesity  6. Perforated ear drum, right I told her that her right tympanic membrane is perforated. She states that she arty knees. Says that she was supposed to have a surgery to correct this but had problems with her insurance coverage then hospitalized with a pneumonia etc. and therefore has never had followup. Eventually she does need to see ENT for followup of this.  7. DM type 2 (diabetes mellitus, type 2)   Signed, [address] [place], [place], [facility] 03/27/2013 2:54 PM

## 2013-04-01 ENCOUNTER — Ambulatory Visit (INDEPENDENT_AMBULATORY_CARE_PROVIDER_SITE_OTHER): Payer: Medicare PPO | Admitting: Family Medicine

## 2013-04-01 VITALS — BP 120/70 | HR 88 | Temp 99.0°F | Resp 18 | Ht 63.0 in | Wt 274.0 lb

## 2013-04-01 DIAGNOSIS — H7290 Unspecified perforation of tympanic membrane, unspecified ear: Secondary | ICD-10-CM

## 2013-04-01 DIAGNOSIS — Z79899 Other long term (current) drug therapy: Secondary | ICD-10-CM

## 2013-04-01 DIAGNOSIS — H7291 Unspecified perforation of tympanic membrane, right ear: Secondary | ICD-10-CM

## 2013-04-01 DIAGNOSIS — M25569 Pain in unspecified knee: Secondary | ICD-10-CM

## 2013-04-01 DIAGNOSIS — I10 Essential (primary) hypertension: Secondary | ICD-10-CM

## 2013-04-01 DIAGNOSIS — J45909 Unspecified asthma, uncomplicated: Secondary | ICD-10-CM

## 2013-04-01 DIAGNOSIS — F329 Major depressive disorder, single episode, unspecified: Secondary | ICD-10-CM

## 2013-04-01 DIAGNOSIS — F32A Depression, unspecified: Secondary | ICD-10-CM

## 2013-04-01 DIAGNOSIS — M25562 Pain in left knee: Secondary | ICD-10-CM

## 2013-04-01 DIAGNOSIS — F3289 Other specified depressive episodes: Secondary | ICD-10-CM

## 2013-04-01 MED ORDER — CITALOPRAM HYDROBROMIDE 40 MG PO TABS: 40.0000 mg | ORAL_TABLET | Freq: Every morning | ORAL | Status: DC

## 2013-04-01 MED ORDER — LOSARTAN POTASSIUM 50 MG PO TABS: 50.0000 mg | ORAL_TABLET | Freq: Every day | ORAL | Status: DC

## 2013-04-01 MED ORDER — CLONAZEPAM 0.5 MG PO TABS: 0.5000 mg | ORAL_TABLET | Freq: Every day | ORAL | Status: DC | PRN

## 2013-04-01 NOTE — Patient Instructions (Addendum)
Referral to Behavioral Health Referral to ENT- Dr. Rema Fendt klonopin once a day as needed Xray of left knee  Diovan changed to losartan once a day for blood pressure  F/U 3 months

## 2013-04-02 ENCOUNTER — Telehealth: Payer: Self-pay | Admitting: *Deleted

## 2013-04-02 NOTE — Telephone Encounter (Signed)
Disregard-done in error

## 2013-04-03 ENCOUNTER — Encounter: Payer: Self-pay | Admitting: Family Medicine

## 2013-04-03 DIAGNOSIS — M25562 Pain in left knee: Secondary | ICD-10-CM | POA: Insufficient documentation

## 2013-04-03 LAB — PRESCRIPTION MONITORING PROFILE (13 PANEL)
Amphetamine/Meth: NEGATIVE ng/mL
Barbiturate Screen, Urine: NEGATIVE ng/mL
Benzodiazepine Screen, Urine: NEGATIVE ng/mL
Buprenorphine, Urine: NEGATIVE ng/mL
Cocaine Metabolites: NEGATIVE ng/mL
Creatinine, Urine: 60.4 mg/dL (ref >20.0)
Fentanyl, Ur: NEGATIVE ng/mL
Meperidine, Ur: NEGATIVE ng/mL
Methadone Screen, Urine: NEGATIVE ng/mL
Nitrites, Initial: NEGATIVE ug/mL
Opiate Screen, Urine: NEGATIVE ng/mL
Oxycodone Screen, Ur: NEGATIVE ng/mL
Propoxyphene: NEGATIVE ng/mL
Tramadol Scrn, Ur: NEGATIVE ng/mL
pH, Initial: 7.2 pH (ref 4.5–8.9)

## 2013-04-03 LAB — CANNABANOIDS (GC/LC/MS), URINE: THC-COOH (GC/LC/MS), ur confirm: 262 ng/mL — AB

## 2013-04-03 NOTE — Progress Notes (Signed)
   Subjective:    Patient ID: [name], female    DOB: July 20, 1965, 47 y.o.   MRN: [id number]  HPI Patient here with multiple concerns. She was recently dilated for COPD exacerbation. She status post antibiotics as well as prednisone taper. Her cough is improving she's continues to have some clear mucus production. She is using her inhalers as well as Mucinex.  Left knee pain for the past 6 months which is been worsening. She feels it pops and feels like it swells on and off. No specific injury. Her pain medicines helps some. She feels like it's going to give out on her  Hypertension-her insurance will no longer cover the Diovan  She is ready to proceed with surgical intervention for her perforated eardrum  Depression/anxiety-he still on her Celexa she continues to have difficulty with anxiety and depression. She of course limits to many appointments with her previous psychiatrist therefore needs a new referral. She would like to return back to come behavioral she was seen before she lost her insurance.   Review of Systems GEN- denies fatigue, fever, weight loss,weakness, recent illness HEENT- denies eye drainage, change in vision, nasal discharge, CVS- denies chest pain, palpitations RESP- denies SOB, +cough,+ wheeze ABD- denies N/V, change in stools, abd pain GU- denies dysuria, hematuria, dribbling, incontinence MSK-+joint pain, muscle aches, injury Neuro- denies headache, dizziness, syncope, seizure activity      Objective:   Physical Exam GEN- NAD, alert and oriented x3- Room Air , HEENT- PERRL, EOMI, non injected sclera, pink conjunctiva, MMM, oropharynx clear,  Left TM clear  no effusion, Right TM chronic perforation no drainage ,nares clear Neck- Supple, no LAD CVS- RRR, no murmur RESP- few scattered wheeze, otherwise clear,  normal WOB at rest, harsh cough,  EXT- No edema Pulses- Radial 2+  Psych- normal affect and mood MSK- Bilat knee normal inspection, Right  -FROM, Left - decreased flexion, +crepitus, TTP inferior and lateral patella, no effusion       Assessment & Plan:

## 2013-04-03 NOTE — Assessment & Plan Note (Signed)
Change Diovan to losartan so this will be covered under her insurance

## 2013-04-03 NOTE — Assessment & Plan Note (Signed)
She wishes to reestablish with psychiatry and therapy. We will try her at [facility]. She will continue her Celexa also restart her Klonopin

## 2013-04-03 NOTE — Assessment & Plan Note (Signed)
Referral back to ear nose and throat for the planned surgical intervention

## 2013-04-03 NOTE — Assessment & Plan Note (Signed)
Status post treatment. She will continue Mucinex and her inhalers

## 2013-04-03 NOTE — Assessment & Plan Note (Signed)
X-rays need to be obtained. Chart has pain medication

## 2013-04-04 ENCOUNTER — Other Ambulatory Visit: Payer: Self-pay | Admitting: Family Medicine

## 2013-04-04 ENCOUNTER — Ambulatory Visit (HOSPITAL_COMMUNITY)
Admission: RE | Admit: 2013-04-04 | Discharge: 2013-04-04 | Disposition: A | Discharge status: Home / Self Care | Payer: Medicare HMO | Source: Ambulatory Visit | Attending: Family Medicine | Admitting: Family Medicine

## 2013-04-04 ENCOUNTER — Ambulatory Visit (HOSPITAL_COMMUNITY): Admission: RE | Admit: 2013-04-04 | Payer: Medicare HMO | Source: Ambulatory Visit

## 2013-04-04 DIAGNOSIS — M25562 Pain in left knee: Secondary | ICD-10-CM

## 2013-04-04 DIAGNOSIS — M25469 Effusion, unspecified knee: Secondary | ICD-10-CM | POA: Insufficient documentation

## 2013-04-04 DIAGNOSIS — M25569 Pain in unspecified knee: Secondary | ICD-10-CM | POA: Insufficient documentation

## 2013-04-04 NOTE — Addendum Note (Signed)
Addended by: Elvina Mattes T on: 04/04/2013 02:56 PM   Modules accepted: Orders

## 2013-04-04 NOTE — Addendum Note (Signed)
Addended by: Elvina Mattes T on: 04/04/2013 03:06 PM   Modules accepted: Orders

## 2013-04-07 ENCOUNTER — Other Ambulatory Visit: Payer: Self-pay | Admitting: *Deleted

## 2013-04-07 MED ORDER — MOMETASONE FURO-FORMOTEROL FUM 100-5 MCG/ACT IN AERO: 2.0000 | INHALATION_SPRAY | Freq: Two times a day (BID) | RESPIRATORY_TRACT | Status: DC

## 2013-04-07 NOTE — Telephone Encounter (Signed)
Medication refilled

## 2013-04-08 ENCOUNTER — Encounter: Payer: Self-pay | Admitting: Gastroenterology

## 2013-04-11 ENCOUNTER — Ambulatory Visit (INDEPENDENT_AMBULATORY_CARE_PROVIDER_SITE_OTHER): Payer: Medicare HMO | Admitting: Family Medicine

## 2013-04-11 VITALS — BP 150/80 | HR 82 | Temp 98.0°F | Resp 18 | Ht 63.0 in | Wt 270.0 lb

## 2013-04-11 DIAGNOSIS — M549 Dorsalgia, unspecified: Secondary | ICD-10-CM

## 2013-04-11 DIAGNOSIS — R892 Abnormal level of other drugs, medicaments and biological substances in specimens from other organs, systems and tissues: Secondary | ICD-10-CM

## 2013-04-11 DIAGNOSIS — G4730 Sleep apnea, unspecified: Secondary | ICD-10-CM

## 2013-04-11 DIAGNOSIS — M5136 Other intervertebral disc degeneration, lumbar region: Secondary | ICD-10-CM

## 2013-04-11 DIAGNOSIS — G4733 Obstructive sleep apnea (adult) (pediatric): Secondary | ICD-10-CM | POA: Insufficient documentation

## 2013-04-11 DIAGNOSIS — M5137 Other intervertebral disc degeneration, lumbosacral region: Secondary | ICD-10-CM

## 2013-04-11 DIAGNOSIS — M51379 Other intervertebral disc degeneration, lumbosacral region without mention of lumbar back pain or lower extremity pain: Secondary | ICD-10-CM

## 2013-04-11 MED ORDER — OXYCODONE-ACETAMINOPHEN 7.5-325 MG PO TABS: 1.0000 | ORAL_TABLET | ORAL | Status: DC | PRN

## 2013-04-11 NOTE — Progress Notes (Signed)
   Subjective:    Patient ID: [name], female    DOB: 1965-11-01, 47 y.o.   MRN: [id number]  HPI  Patient here to discuss urine drug screen. She had a recent urine drug screen at our last visit. She is on a pain contract. She currently receives 120 tablets of Percocet 7.5/325 mg monthly. Her urine drug screen was positive for marijuana and there were no opiates present. The screen was confirmed and there were no opiates. Today she states that she does take her medications up to 4 times a day and sometimes 2 at a time for some days and other daysshe does not take the medicine at all. She did begin crying he states that her sister was in a lot of pain therefore she gave some awakes in her sister and other family members. She states she does not know which is noted to do if she does not have pain medication. She states that she uses for marijuana because she cannot rest.  She also has not her back regarding her sleep study. Review of Systems - per above     Objective:   Physical Exam  GEN-NAD,alert and orieneted x 3 Psych- crying discussing her drug test, not anxious or depressed appearing      Assessment & Plan:

## 2013-04-11 NOTE — Assessment & Plan Note (Signed)
Concern for OSA. She is due to have sleep study which has not been done yet. She had recently established with Medicare. She will be referred to neurology for both pain management and a sleep study

## 2013-04-11 NOTE — Assessment & Plan Note (Signed)
Her drug screen was positive for marijuana use and negative for opiates. Of note she was not on the benzodiazepines at the time of the drug screen. I do think that she uses the pain medications on some instances however she's had multiple visits where she requested more medication because she wants out too soon and then today she tells me that she does not take them everyday and has given and the family members. She has been on high doses I would not cut her off completely she was given 60 tablets for the next month to use sparingly and to last until she gets into a pain clinic.  She understands that I would no longer prescribe her any narcotic medications.

## 2013-04-11 NOTE — Patient Instructions (Signed)
F/U Given 60 tablets of percocet, None further Referral to pain clinic

## 2013-04-11 NOTE — Assessment & Plan Note (Signed)
Referred to pain clinic. She has degenerative disc disease in her lumbar spine as well as bulging disc in her neck and back. She has been on narcotics for my office since 2013

## 2013-04-28 ENCOUNTER — Encounter: Payer: Self-pay | Admitting: Gastroenterology

## 2013-05-07 ENCOUNTER — Ambulatory Visit: Payer: Medicare PPO | Admitting: Gastroenterology

## 2013-05-08 ENCOUNTER — Ambulatory Visit (INDEPENDENT_AMBULATORY_CARE_PROVIDER_SITE_OTHER): Payer: Medicare HMO | Admitting: Otolaryngology

## 2013-05-12 ENCOUNTER — Telehealth: Payer: Self-pay | Admitting: *Deleted

## 2013-05-12 NOTE — Telephone Encounter (Signed)
Pt needs refill on oxycodone 7.5-325mg  takes she has appt with pain mgmt in Mar 13 for intake class and needs refill.

## 2013-05-13 ENCOUNTER — Other Ambulatory Visit: Payer: Self-pay | Admitting: Family Medicine

## 2013-05-13 NOTE — Telephone Encounter (Signed)
No refills

## 2013-05-15 NOTE — Telephone Encounter (Signed)
Pt aware.

## 2013-05-15 NOTE — Telephone Encounter (Signed)
I have responded to this message twice. Pt has failed the pain contract, in my last note it states I will not prescribe any further narcotics Please call her directly She will have to wait until she gets in pain clinic

## 2013-05-20 ENCOUNTER — Encounter (HOSPITAL_COMMUNITY): Payer: Self-pay | Admitting: Psychiatry

## 2013-05-20 ENCOUNTER — Ambulatory Visit (INDEPENDENT_AMBULATORY_CARE_PROVIDER_SITE_OTHER): Payer: Medicare HMO | Admitting: Psychiatry

## 2013-05-20 VITALS — BP 140/84 | Ht 63.0 in | Wt 267.0 lb

## 2013-05-20 DIAGNOSIS — F329 Major depressive disorder, single episode, unspecified: Secondary | ICD-10-CM

## 2013-05-20 MED ORDER — ALPRAZOLAM 0.5 MG PO TABS: 0.5000 mg | ORAL_TABLET | Freq: Two times a day (BID) | ORAL | Status: DC
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
## 2013-05-21 ENCOUNTER — Telehealth: Payer: Self-pay | Admitting: Gastroenterology

## 2013-05-21 ENCOUNTER — Ambulatory Visit: Payer: Medicare HMO | Admitting: Gastroenterology

## 2013-05-21 ENCOUNTER — Encounter: Payer: Self-pay | Admitting: Gastroenterology

## 2013-05-21 NOTE — Telephone Encounter (Signed)
MAILED LETTER °

## 2013-05-21 NOTE — Telephone Encounter (Signed)
Pt was a no show

## 2013-05-26 ENCOUNTER — Encounter: Payer: Self-pay | Admitting: Family Medicine

## 2013-05-27 ENCOUNTER — Telehealth: Payer: Self-pay | Admitting: *Deleted

## 2013-05-27 MED ORDER — PREGABALIN 75 MG PO CAPS: 75.0000 mg | ORAL_CAPSULE | Freq: Two times a day (BID) | ORAL | Status: DC

## 2013-05-27 NOTE — Telephone Encounter (Signed)
Med refilled.

## 2013-05-27 NOTE — Telephone Encounter (Signed)
Okay to refill lyrica 

## 2013-05-27 NOTE — Telephone Encounter (Signed)
?   Ok to refill lastrefill 04/26/13 last ov 04/11/13

## 2013-06-06 ENCOUNTER — Ambulatory Visit (HOSPITAL_COMMUNITY): Payer: Self-pay | Admitting: Psychiatry

## 2013-06-17 ENCOUNTER — Encounter: Payer: Self-pay | Admitting: *Deleted

## 2013-06-17 ENCOUNTER — Ambulatory Visit: Payer: Medicare HMO | Admitting: Family Medicine

## 2013-06-17 NOTE — Telephone Encounter (Signed)
This encounter was created in error - please disregard.

## 2013-06-20 ENCOUNTER — Ambulatory Visit (HOSPITAL_COMMUNITY): Payer: Self-pay | Admitting: Psychiatry

## 2013-06-20 ENCOUNTER — Ambulatory Visit: Payer: Self-pay | Admitting: Family Medicine

## 2013-06-27 ENCOUNTER — Other Ambulatory Visit: Payer: Self-pay | Admitting: Family Medicine

## 2013-06-27 DIAGNOSIS — E119 Type 2 diabetes mellitus without complications: Secondary | ICD-10-CM

## 2013-06-27 NOTE — Telephone Encounter (Signed)
One refill given.  Has appt here next week

## 2013-07-02 ENCOUNTER — Ambulatory Visit (INDEPENDENT_AMBULATORY_CARE_PROVIDER_SITE_OTHER): Payer: Medicare HMO | Admitting: Family Medicine

## 2013-07-02 ENCOUNTER — Encounter: Payer: Self-pay | Admitting: Family Medicine

## 2013-07-02 VITALS — BP 132/78 | HR 88 | Temp 98.5°F | Resp 18 | Ht 63.25 in | Wt 274.0 lb

## 2013-07-02 DIAGNOSIS — I10 Essential (primary) hypertension: Secondary | ICD-10-CM

## 2013-07-02 DIAGNOSIS — E119 Type 2 diabetes mellitus without complications: Secondary | ICD-10-CM

## 2013-07-02 DIAGNOSIS — G629 Polyneuropathy, unspecified: Secondary | ICD-10-CM

## 2013-07-02 DIAGNOSIS — J019 Acute sinusitis, unspecified: Secondary | ICD-10-CM

## 2013-07-02 DIAGNOSIS — G4730 Sleep apnea, unspecified: Secondary | ICD-10-CM

## 2013-07-02 DIAGNOSIS — G609 Hereditary and idiopathic neuropathy, unspecified: Secondary | ICD-10-CM

## 2013-07-02 DIAGNOSIS — J45909 Unspecified asthma, uncomplicated: Secondary | ICD-10-CM

## 2013-07-02 MED ORDER — SULFAMETHOXAZOLE-TMP DS 800-160 MG PO TABS: 1.0000 | ORAL_TABLET | Freq: Two times a day (BID) | ORAL | Status: DC

## 2013-07-02 MED ORDER — PREDNISONE 10 MG PO TABS: ORAL_TABLET | ORAL | Status: DC

## 2013-07-02 NOTE — Assessment & Plan Note (Signed)
-   Check A1C  - Continue metformin

## 2013-07-02 NOTE — Progress Notes (Signed)
Patient ID: [name], female   DOB: 1966-04-19, 48 y.o.   MRN: [id number]   Subjective:    Patient ID: [name], female    DOB: 03/01/1966, 48 y.o.   MRN: [id number]  Patient presents for 3 month F/U and Illness  patient here to followup chronic medical problems. Unfortunately she missed her ear nose and throat visit as well as her pulmonary followup in her gastroenterology followup. She states that she was sick and also due to weather. She doesn't appointment with the pain clinic for her intake last visit prior day.  She has been coughing with production and wheezing over the past 3 weeks. She also had sinus drainage with thick mucus. Since she was unable to come in for appointments which she rescheduled multiple times she's been using Mucinex as well as her inhalers at home. She still feels tight in the chest.  She's been seen by psychiatry though she did miss her followup appointment with them. She still on Celexa but was changed to Xanax twice a day. She did mention that she felt better off of her high-dose chronic pain medications   DM- she is not checking CBG, but is taking MTF, due for repeat labs  Review Of Systems:  GEN- denies fatigue, fever, weight loss,weakness, recent illness HEENT- denies eye drainage, change in vision, +nasal discharge, CVS- denies chest pain, palpitations RESP- +SOB,+ cough, +wheeze ABD- denies N/V, change in stools, abd pain GU- denies dysuria, hematuria, dribbling, incontinence MSK- + joint pain, muscle aches, injury Neuro- denies headache, dizziness, syncope, seizure activity       Objective:    BP 132/78  Pulse 88  Temp(Src) 98.5 F (36.9 C)  Resp 18  Ht 5' 3.25" (1.607 m)  Wt 274 lb (124.286 kg)  BMI 48.13 kg/m2  LMP 05/26/2013 GEN- NAD, alert and oriented x3- HEENT- PERRL, EOMI, non injected sclera, pink conjunctiva, MMM, oropharynx clear,  Left TM clear  no effusion, Right TM chronic perforation no drainage ,nares  clear, +TTP maxillary sinus Neck- Supple, no LAD CVS- RRR, no murmur RESP- few scattered wheeze, mild rhonchi, clears with cough  EXT- No edema Pulses- Radial 2+  Psych- normal affect and mood       Assessment & Plan:      Problem List Items Addressed This Visit   Unspecified sleep apnea     Neck circumference is 20 inches she'll be set up for sleep study this is still pending    HTN (hypertension) - Primary (Chronic)     Well controlled    DM type 2 (diabetes mellitus, type 2) (Chronic)     Check A1C  Continue metformin      Relevant Orders      CBC with Differential      Comprehensive metabolic panel      Hemoglobin A1c      Lipid panel   Asthmatic bronchitis     Patient with recent flare of her asthma bronchitis. I will put her on a prednisone taper as well as the Bactrim she also has acute sinusitis she is known to decompensate very quickly required hospital admission    Relevant Medications      predniSONE (DELTASONE) tablet    Other Visit Diagnoses   Acute sinusitis        Bactrim given    Relevant Medications       sulfamethoxazole-trimethoprim (BACTRIM/SEPTRA DS) tablet 800-160 mg       Note: This dictation was prepared with  Dragon dictation along with smaller Company secretary. Any transcriptional errors that result from this process are unintentional.

## 2013-07-02 NOTE — Assessment & Plan Note (Signed)
Well controlled 

## 2013-07-02 NOTE — Assessment & Plan Note (Signed)
Patient with recent flare of her asthma bronchitis. I will put her on a prednisone taper as well as the Bactrim she also has acute sinusitis she is known to decompensate very quickly required hospital admission

## 2013-07-02 NOTE — Assessment & Plan Note (Signed)
Neck circumference is 20 inches she'll be set up for sleep study this is still pending

## 2013-07-02 NOTE — Patient Instructions (Signed)
Sleep study to be done by neurology Start prednisone and antibiotics Continue mucinex and inhalers We will call with lab results F/U 3 months

## 2013-07-02 NOTE — Assessment & Plan Note (Signed)
Continue lyrica 

## 2013-07-03 LAB — HEMOGLOBIN A1C
Hgb A1c MFr Bld: 6.7 % — ABNORMAL HIGH (ref <5.7)
Mean Plasma Glucose: 146 mg/dL — ABNORMAL HIGH (ref <117)

## 2013-07-03 LAB — CBC WITH DIFFERENTIAL/PLATELET
Basophils Absolute: 0 10*3/uL (ref 0.0–0.1)
Basophils Relative: 0 % (ref 0–1)
Eosinophils Absolute: 0.1 10*3/uL (ref 0.0–0.7)
Eosinophils Relative: 1 % (ref 0–5)
HCT: 34.6 % — ABNORMAL LOW (ref 36.0–46.0)
Hemoglobin: 10.6 g/dL — ABNORMAL LOW (ref 12.0–15.0)
Lymphocytes Relative: 29 % (ref 12–46)
Lymphs Abs: 3.6 10*3/uL (ref 0.7–4.0)
MCH: 20.8 pg — ABNORMAL LOW (ref 26.0–34.0)
MCHC: 30.6 g/dL (ref 30.0–36.0)
MCV: 68 fL — ABNORMAL LOW (ref 78.0–100.0)
Monocytes Absolute: 0.8 10*3/uL (ref 0.1–1.0)
Monocytes Relative: 6 % (ref 3–12)
Neutro Abs: 8 10*3/uL — ABNORMAL HIGH (ref 1.7–7.7)
Neutrophils Relative %: 64 % (ref 43–77)
Platelets: 352 10*3/uL (ref 150–400)
RBC: 5.09 MIL/uL (ref 3.87–5.11)
RDW: 17.7 % — ABNORMAL HIGH (ref 11.5–15.5)
WBC: 12.5 10*3/uL — ABNORMAL HIGH (ref 4.0–10.5)

## 2013-07-03 LAB — COMPREHENSIVE METABOLIC PANEL
ALT: 10 U/L (ref 0–35)
AST: 8 U/L (ref 0–37)
Albumin: 3.7 g/dL (ref 3.5–5.2)
Alkaline Phosphatase: 47 U/L (ref 39–117)
BUN: 8 mg/dL (ref 6–23)
CO2: 31 mEq/L (ref 19–32)
Calcium: 8.8 mg/dL (ref 8.4–10.5)
Chloride: 102 mEq/L (ref 96–112)
Creat: 0.61 mg/dL (ref 0.50–1.10)
Glucose, Bld: 104 mg/dL — ABNORMAL HIGH (ref 70–99)
Potassium: 4 mEq/L (ref 3.5–5.3)
Sodium: 139 mEq/L (ref 135–145)
Total Bilirubin: 0.4 mg/dL (ref 0.2–1.2)
Total Protein: 6.4 g/dL (ref 6.0–8.3)

## 2013-07-03 LAB — LIPID PANEL
Cholesterol: 150 mg/dL (ref 0–200)
HDL: 36 mg/dL — ABNORMAL LOW (ref >39)
LDL Cholesterol: 97 mg/dL (ref 0–99)
Total CHOL/HDL Ratio: 4.2 Ratio
Triglycerides: 87 mg/dL (ref <150)
VLDL: 17 mg/dL (ref 0–40)

## 2013-07-08 ENCOUNTER — Other Ambulatory Visit: Payer: Self-pay | Admitting: *Deleted

## 2013-07-08 MED ORDER — FERROUS SULFATE 325 (65 FE) MG PO TABS: 325.0000 mg | ORAL_TABLET | Freq: Two times a day (BID) | ORAL | Status: DC

## 2013-07-08 MED ORDER — PRAVASTATIN SODIUM 10 MG PO TABS: 10.0000 mg | ORAL_TABLET | Freq: Every day | ORAL | Status: DC

## 2013-07-08 NOTE — Telephone Encounter (Signed)
Per orders noted on labs, medication sent to pharmacy.   Call placed to patient and patient made aware. 

## 2013-07-09 ENCOUNTER — Encounter: Payer: Self-pay | Admitting: Family Medicine

## 2013-07-10 ENCOUNTER — Telehealth: Payer: Self-pay | Admitting: *Deleted

## 2013-07-10 NOTE — Telephone Encounter (Signed)
Received e-mail from patient reporting increased back pain.   Requested non-narcotic pain medication (tramadol) or a muscle relaxer to be called to pharmacy.   Patient is scheduled to be seen at pain clinic on 08/19/2013 for initial visit.   MD please advise.

## 2013-07-10 NOTE — Telephone Encounter (Signed)
Defer to Dr. Buelah Manis tomorrow.

## 2013-07-11 MED ORDER — METHOCARBAMOL 500 MG PO TABS: 500.0000 mg | ORAL_TABLET | Freq: Three times a day (TID) | ORAL | Status: DC | PRN

## 2013-07-11 NOTE — Telephone Encounter (Signed)
Prescription sent to pharmacy.  Patient made aware.   

## 2013-07-11 NOTE — Telephone Encounter (Signed)
Send Robaxin 500mg  TID prn spasm, #45 R 1

## 2013-08-02 ENCOUNTER — Emergency Department (HOSPITAL_COMMUNITY): Payer: Medicare HMO

## 2013-08-02 ENCOUNTER — Other Ambulatory Visit: Payer: Self-pay

## 2013-08-02 ENCOUNTER — Encounter (HOSPITAL_COMMUNITY): Payer: Self-pay | Admitting: Emergency Medicine

## 2013-08-02 ENCOUNTER — Emergency Department (HOSPITAL_COMMUNITY)
Admission: EM | Admit: 2013-08-02 | Discharge: 2013-08-02 | Disposition: A | Discharge status: Home / Self Care | Payer: Medicare HMO | Attending: Emergency Medicine | Admitting: Emergency Medicine

## 2013-08-02 DIAGNOSIS — F411 Generalized anxiety disorder: Secondary | ICD-10-CM | POA: Insufficient documentation

## 2013-08-02 DIAGNOSIS — F17200 Nicotine dependence, unspecified, uncomplicated: Secondary | ICD-10-CM | POA: Insufficient documentation

## 2013-08-02 DIAGNOSIS — Z8739 Personal history of other diseases of the musculoskeletal system and connective tissue: Secondary | ICD-10-CM | POA: Insufficient documentation

## 2013-08-02 DIAGNOSIS — K219 Gastro-esophageal reflux disease without esophagitis: Secondary | ICD-10-CM | POA: Insufficient documentation

## 2013-08-02 DIAGNOSIS — IMO0002 Reserved for concepts with insufficient information to code with codable children: Secondary | ICD-10-CM | POA: Insufficient documentation

## 2013-08-02 DIAGNOSIS — F3289 Other specified depressive episodes: Secondary | ICD-10-CM | POA: Insufficient documentation

## 2013-08-02 DIAGNOSIS — F329 Major depressive disorder, single episode, unspecified: Secondary | ICD-10-CM | POA: Insufficient documentation

## 2013-08-02 DIAGNOSIS — E119 Type 2 diabetes mellitus without complications: Secondary | ICD-10-CM | POA: Insufficient documentation

## 2013-08-02 DIAGNOSIS — H6090 Unspecified otitis externa, unspecified ear: Secondary | ICD-10-CM

## 2013-08-02 DIAGNOSIS — Z9981 Dependence on supplemental oxygen: Secondary | ICD-10-CM | POA: Insufficient documentation

## 2013-08-02 DIAGNOSIS — Z79899 Other long term (current) drug therapy: Secondary | ICD-10-CM | POA: Insufficient documentation

## 2013-08-02 DIAGNOSIS — M549 Dorsalgia, unspecified: Secondary | ICD-10-CM | POA: Insufficient documentation

## 2013-08-02 DIAGNOSIS — I10 Essential (primary) hypertension: Secondary | ICD-10-CM | POA: Insufficient documentation

## 2013-08-02 DIAGNOSIS — Z862 Personal history of diseases of the blood and blood-forming organs and certain disorders involving the immune mechanism: Secondary | ICD-10-CM | POA: Insufficient documentation

## 2013-08-02 DIAGNOSIS — J45901 Unspecified asthma with (acute) exacerbation: Secondary | ICD-10-CM | POA: Insufficient documentation

## 2013-08-02 DIAGNOSIS — R079 Chest pain, unspecified: Secondary | ICD-10-CM | POA: Insufficient documentation

## 2013-08-02 DIAGNOSIS — J9610 Chronic respiratory failure, unspecified whether with hypoxia or hypercapnia: Secondary | ICD-10-CM | POA: Insufficient documentation

## 2013-08-02 DIAGNOSIS — J40 Bronchitis, not specified as acute or chronic: Secondary | ICD-10-CM

## 2013-08-02 DIAGNOSIS — H60399 Other infective otitis externa, unspecified ear: Secondary | ICD-10-CM | POA: Insufficient documentation

## 2013-08-02 LAB — CBC WITH DIFFERENTIAL/PLATELET
Basophils Absolute: 0 10*3/uL (ref 0.0–0.1)
Basophils Relative: 0 % (ref 0–1)
Eosinophils Absolute: 0.1 10*3/uL (ref 0.0–0.7)
Eosinophils Relative: 1 % (ref 0–5)
HCT: 34.2 % — ABNORMAL LOW (ref 36.0–46.0)
Hemoglobin: 10.2 g/dL — ABNORMAL LOW (ref 12.0–15.0)
Lymphocytes Relative: 28 % (ref 12–46)
Lymphs Abs: 2.7 10*3/uL (ref 0.7–4.0)
MCH: 21.3 pg — ABNORMAL LOW (ref 26.0–34.0)
MCHC: 29.8 g/dL — ABNORMAL LOW (ref 30.0–36.0)
MCV: 71.5 fL — ABNORMAL LOW (ref 78.0–100.0)
Monocytes Absolute: 0.6 10*3/uL (ref 0.1–1.0)
Monocytes Relative: 6 % (ref 3–12)
Neutro Abs: 6.1 10*3/uL (ref 1.7–7.7)
Neutrophils Relative %: 65 % (ref 43–77)
Platelets: 253 10*3/uL (ref 150–400)
RBC: 4.78 MIL/uL (ref 3.87–5.11)
RDW: 19.1 % — ABNORMAL HIGH (ref 11.5–15.5)
WBC: 9.5 10*3/uL (ref 4.0–10.5)

## 2013-08-02 LAB — BASIC METABOLIC PANEL
BUN: 5 mg/dL — ABNORMAL LOW (ref 6–23)
CO2: 28 mEq/L (ref 19–32)
Calcium: 9.2 mg/dL (ref 8.4–10.5)
Chloride: 100 mEq/L (ref 96–112)
Creatinine, Ser: 0.62 mg/dL (ref 0.50–1.10)
GFR calc Af Amer: >90 mL/min (ref >90)
GFR calc non Af Amer: >90 mL/min (ref >90)
Glucose, Bld: 129 mg/dL — ABNORMAL HIGH (ref 70–99)
Potassium: 3.9 mEq/L (ref 3.7–5.3)
Sodium: 139 mEq/L (ref 137–147)

## 2013-08-02 LAB — TROPONIN I: Troponin I: <0.3 ng/mL (ref <0.30)

## 2013-08-02 MED ORDER — OFLOXACIN 0.3 % OT SOLN: 5.0000 [drp] | Freq: Every day | OTIC | Status: DC

## 2013-08-02 MED ORDER — IBUPROFEN 400 MG PO TABS
400.0000 mg | ORAL_TABLET | Freq: Once | ORAL | Status: AC
  Administered 2013-08-02: 400 mg via ORAL
  Filled 2013-08-02: qty 1

## 2013-08-02 MED ORDER — ALBUTEROL SULFATE (2.5 MG/3ML) 0.083% IN NEBU
5.0000 mg | INHALATION_SOLUTION | Freq: Once | RESPIRATORY_TRACT | Status: AC
  Administered 2013-08-02: 5 mg via RESPIRATORY_TRACT
  Filled 2013-08-02: qty 6

## 2013-08-02 MED ORDER — BENZONATATE 100 MG PO CAPS
100.0000 mg | ORAL_CAPSULE | Freq: Three times a day (TID) | ORAL | Status: DC
Start: 2013-08-02 — End: 2013-10-03

## 2013-08-02 MED ORDER — OXYCODONE-ACETAMINOPHEN 5-325 MG PO TABS
1.0000 | ORAL_TABLET | Freq: Once | ORAL | Status: AC
Start: 2013-08-02 — End: 2013-08-02
  Administered 2013-08-02: 1 via ORAL
  Filled 2013-08-02: qty 1

## 2013-08-02 MED ORDER — IPRATROPIUM BROMIDE 0.02 % IN SOLN
0.5000 mg | Freq: Once | RESPIRATORY_TRACT | Status: AC
  Administered 2013-08-02: 0.5 mg via RESPIRATORY_TRACT
  Filled 2013-08-02: qty 2.5

## 2013-08-02 MED ORDER — OXYCODONE-ACETAMINOPHEN 5-325 MG PO TABS: 1.0000 | ORAL_TABLET | ORAL | Status: DC | PRN

## 2013-08-02 MED ORDER — METHOCARBAMOL 500 MG PO TABS: 500.0000 mg | ORAL_TABLET | Freq: Two times a day (BID) | ORAL | Status: DC

## 2013-08-02 MED ORDER — AMOXICILLIN 500 MG PO CAPS: 1000.0000 mg | ORAL_CAPSULE | Freq: Two times a day (BID) | ORAL | Status: DC

## 2013-08-02 MED ORDER — PREDNISONE 20 MG PO TABS: 40.0000 mg | ORAL_TABLET | Freq: Every day | ORAL | Status: DC

## 2013-08-02 MED ORDER — PREDNISONE 20 MG PO TABS
40.0000 mg | ORAL_TABLET | Freq: Once | ORAL | Status: AC
  Administered 2013-08-02: 40 mg via ORAL
  Filled 2013-08-02: qty 2

## 2013-08-02 NOTE — Discharge Instructions (Signed)

## 2013-08-02 NOTE — ED Notes (Signed)
Pt requesting prescription for percocet's, states " I have tried robaxin and it did not work" Dr Wilson Singer notified,

## 2013-08-02 NOTE — ED Provider Notes (Signed)
CSN: [id number]     Arrival date & time 08/02/13  1328 History   First MD Initiated Contact with Patient 08/02/13 1527    This chart was scribed for Virgel Manifold, MD by Terressa Koyanagi, ED Scribe. This patient was seen in room APA02/[facility]02 and the patient's care was started at 3:50 PM.  Chief Complaint  Patient presents with  . Cough     (Consider location/radiation/quality/duration/timing/severity/associated sxs/prior Treatment) The history is provided by the patient. No language interpreter was used.   HPI Comments: [name] is a 48 y.o. female, with a history of DM, HTN, chronic respiratory failure with hypoxia, GERD, arthritis, anemia, and SOB who presents to the Emergency Department complaining of a cough onset 6 days ago. Pt also complains of associated chest pain that was intermittent until today, today her chest pain has been constant. Pt describes her chest pain as an aching pain. Pt also complains of associated back pain, SOB with movement (pt reports that her SOB is worse than baseline today). Pt reports she took ibuprofen yesterday for her symptoms with little relief. Pt denies ever having a stress test.   Pt reports a family Hx of congestive heart failure.   Past Medical History  Diagnosis Date  . Diabetes mellitus   . Asthmatic bronchitis     normal PFT/ seen by pulmonary no evidence of COPD  . HTN (hypertension)   . Low back pain   . Tachycardia     never had test done since no insurance  . Depression   . Shortness of breath   . Anxiety   . Gastric erosions     EGD 08/2010.  . Internal hemorrhoids     Colonoscopy 5/12.  [name] Heavy menses   . Chronic respiratory failure with hypoxia     On 2-3 L of oxygen at home  . GERD (gastroesophageal reflux disease)   . Anemia   . Arthritis    Past Surgical History  Procedure Laterality Date  . Cholecystectomy  1990  . Cesarean section      twice  . Kidney surgery      as child for blockages  . Tubal ligation     . Tonsillectomy    . Wrist surgery  1995    Lt wrist  . Tympanostomy tube placement    . Hysteroscopy with thermachoice  01/17/2012    Procedure: HYSTEROSCOPY WITH THERMACHOICE;  Surgeon: Florian Buff, MD;  Location: [facility];  Service: Gynecology;  Laterality: N/A;  total therapy time: 9:13sec  D5W  18 ml in, D5W   36ml out, temperture 87degrees celcious  . Uterine ablation     Family History  Problem Relation Age of Onset  . Heart attack Father 79    deceased, etoh use  . Heart disease Father   . Alcohol abuse Father   . Depression Father   . Heart attack Mother 65    deceased  . Diabetes Mother   . Breast cancer Mother   . Heart failure Mother     oxygen dependence, nonsmoker  . Heart disease Mother   . Depression Mother   . Cancer Mother   . Colon cancer Neg Hx   . Liver disease Maternal Aunt 47    died while on liver transplant list  . Heart attack Maternal Grandmother     premature CAD  . Ulcers Sister   . Hypertension Sister    History  Substance Use Topics  . Smoking status: Current  Every Day Smoker -- 1.00 packs/day for 30 years    Types: Cigarettes    Last Attempt to Quit: 01/27/2013  . Smokeless tobacco: Never Used     Comment: using the vapor cigarettes  . Alcohol Use: Yes     Comment: social use   OB History   Grav Para Term Preterm Abortions TAB SAB Ect Mult Living                 Review of Systems  Respiratory: Positive for cough, shortness of breath and wheezing.   Cardiovascular: Positive for chest pain.  Musculoskeletal: Positive for back pain.   Allergies  Codeine and Wellbutrin  Home Medications   Current Outpatient Rx  Name  Route  Sig  Dispense  Refill  . albuterol (PROVENTIL) (2.5 MG/3ML) 0.083% nebulizer solution   Nebulization   Take 3 mLs (2.5 mg total) by nebulization every 6 (six) hours as needed for wheezing.   120 mL   12   . ALPRAZolam (XANAX) 0.5 MG tablet   Oral   Take 1 tablet (0.5 mg total) by mouth 2 (two) times  daily.   60 tablet   2   . citalopram (CELEXA) 40 MG tablet   Oral   Take 1 tablet (40 mg total) by mouth every morning.   30 tablet   6   . famotidine (PEPCID) 20 MG tablet      One at bedtime   30 tablet   11   . ferrous sulfate 325 (65 FE) MG tablet   Oral   Take 1 tablet (325 mg total) by mouth 2 (two) times daily with a meal.   60 tablet   3   . fluticasone (FLONASE) 50 MCG/ACT nasal spray   Nasal   Place 2 sprays into the nose daily.   16 g   11   . ibuprofen (ADVIL,MOTRIN) 200 MG tablet   Oral   Take 200-400 mg by mouth every 6 (six) hours as needed for pain. For pain         . loperamide (IMODIUM A-D) 2 MG tablet   Oral   Take 2 mg by mouth daily as needed for diarrhea or loose stools.         [name] loratadine (CLARITIN) 10 MG tablet   Oral   Take 1 tablet (10 mg total) by mouth daily.   30 tablet   5   . losartan (COZAAR) 50 MG tablet   Oral   Take 1 tablet (50 mg total) by mouth daily.   30 tablet   3   . metFORMIN (GLUCOPHAGE) 500 MG tablet      TAKE ONE TABLET BY MOUTH TWICE DAILY WITH A MEAL   60 tablet   0   . methocarbamol (ROBAXIN) 500 MG tablet   Oral   Take 1 tablet (500 mg total) by mouth every 8 (eight) hours as needed for muscle spasms.   45 tablet   1   . mometasone-formoterol (DULERA) 100-5 MCG/ACT AERO   Inhalation   Inhale 2 puffs into the lungs 2 (two) times daily.   1 Inhaler   2   . omeprazole (PRILOSEC) 40 MG capsule      Take 30-60 min before first meal of the day   30 capsule   11   . pravastatin (PRAVACHOL) 10 MG tablet   Oral   Take 1 tablet (10 mg total) by mouth daily.   30 tablet   3   .  predniSONE (DELTASONE) 10 MG tablet      Take 40mg  x 3 days,20mg  x 3 days, 10mg  x 3 days   21 tablet   0   . pregabalin (LYRICA) 75 MG capsule   Oral   Take 1 capsule (75 mg total) by mouth 2 (two) times daily.   60 capsule   3   . sulfamethoxazole-trimethoprim (BACTRIM DS) 800-160 MG per tablet   Oral    Take 1 tablet by mouth 2 (two) times daily.   14 tablet   0    Triage Vitals: BP 143/66  Pulse 103  Temp(Src) 98.3 F (36.8 C)  Resp 18  Ht 5' 3.5" (1.613 m)  Wt 273 lb (123.832 kg)  BMI 47.60 kg/m2  SpO2 99%  LMP 07/02/2013 Physical Exam  Nursing note and vitals reviewed. Constitutional: She is oriented to person, place, and time. She appears well-developed and well-nourished.  HENT:  Head: Normocephalic and atraumatic.  Right tympanic membrane with perforation. Patient reports this is chronic. Milky thin discharge in external auditory canal. Difficult to tell if this is from her middle ear or possibly otitis externa. She does not appear to have significant discomfort with manipulation of her pinna which would argue against otitis externa. Mastoid region without any concerning skin changes or tenderness.    Eyes: Conjunctivae and EOM are normal. Pupils are equal, round, and reactive to light.  Neck: Normal range of motion. Neck supple.  Cardiovascular: Normal rate, regular rhythm and normal heart sounds.   Pulmonary/Chest: Effort normal. She has wheezes (bilateral epiratory wheezing).  Ability to speak in complete sentences no accessory muscle usage.   Abdominal: Soft. Bowel sounds are normal.  Musculoskeletal: Normal range of motion. She exhibits no edema and no tenderness.  No edema no calf tenderness  Neurological: She is alert and oriented to person, place, and time.  Skin: Skin is warm and dry.  Psychiatric: She has a normal mood and affect. Her behavior is normal.    ED Course  Procedures (including critical care time) DIAGNOSTIC STUDIES: Oxygen Saturation is 99% on RA, normal by my interpretation.    COORDINATION OF CARE: 3:56 PM-Discussed treatment plan which includes chest xray and breathing treatment with pt at bedside and pt agreed to plan.   Labs Review Labs Reviewed - No data to display Imaging Review Dg Chest 2 View  08/02/2013   CLINICAL DATA:  Cough,  shortness of breath.  EXAM: CHEST  2 VIEW  COMPARISON:  February 03, 2013.  FINDINGS: The heart size and mediastinal contours are within normal limits. Both lungs are clear. No pneumothorax or pleural effusion is noted. The visualized skeletal structures are unremarkable.  IMPRESSION: No acute cardiopulmonary abnormality seen.   Electronically Signed   By: Sabino Dick M.D.   On: 08/02/2013 14:10     EKG Interpretation   Date/Time:  Saturday August 02 2013 16:34:09 EDT Ventricular Rate:  85 PR Interval:  136 QRS Duration: 86 QT Interval:  402 QTC Calculation: 478 R Axis:   28 Text Interpretation:  Normal sinus rhythm Normal ECG When compared with  ECG of 10-Jan-2013 12:06, No significant change was found Confirmed by  Wilson Singer  MD, Jhovany Weidinger ([id number]) on 08/02/2013 5:45:18 PM      MDM   Final diagnoses:  Bronchitis  Otitis externa    48 year old female with likely bronchitis. Doubt bacterial infection or pulmonary embolism. Her chest pain is consistent with muscular strain from ongoing coughing. This is delayed onset and worse with  coughing and palpation. Atypical for ACS. Additionally, right tympanic membrane perforation which patient reports is chronic. She's complaining of some pain though and does have some fluid in her external auditory canal. Clinically cannot differentiate possible otitis interna versus otitis externa. Antibiotics to cover for both. Return precautions were discussed. Outpatient followup.  I personally preformed the services scribed in my presence. The recorded information has been reviewed is accurate. Virgel Manifold, MD.    Virgel Manifold, MD 08/07/13 [id number]

## 2013-08-02 NOTE — ED Notes (Signed)
Pt states she has had a cough since Easter. States she feels really tired and her chest is sore from coughing

## 2013-08-02 NOTE — ED Notes (Addendum)
Pt c/o nasal congestion, cold symptoms that started a week ago, reports that the "cold moved to her chest" a few days ago, admits to cough that is productive, unsure of color of sputum, chest tightness that is worse with palpation, movement, and respirations, unsure of fever or chills due to taking ibuprofen for body aches, and has been having "some" diarrhea. Denies any sob, pt alert, able to answer questions, no resp distress noted, able to speak in complete sentences, Dr Wilson Singer at bedside,

## 2013-08-05 ENCOUNTER — Other Ambulatory Visit: Payer: Self-pay | Admitting: Family Medicine

## 2013-08-05 NOTE — Telephone Encounter (Signed)
Medication refilled per protocol. 

## 2013-08-26 ENCOUNTER — Other Ambulatory Visit: Payer: Self-pay | Admitting: Neurology

## 2013-08-26 DIAGNOSIS — M545 Low back pain, unspecified: Secondary | ICD-10-CM

## 2013-09-02 ENCOUNTER — Other Ambulatory Visit: Payer: Self-pay | Admitting: Family Medicine

## 2013-09-02 NOTE — Telephone Encounter (Signed)
Refill appropriate and filled per protocol. 

## 2013-09-03 ENCOUNTER — Ambulatory Visit (HOSPITAL_COMMUNITY)
Admission: RE | Admit: 2013-09-03 | Discharge: 2013-09-03 | Disposition: A | Discharge status: Home / Self Care | Payer: Medicare HMO | Source: Ambulatory Visit | Attending: Neurology | Admitting: Neurology

## 2013-09-03 DIAGNOSIS — M79609 Pain in unspecified limb: Secondary | ICD-10-CM | POA: Insufficient documentation

## 2013-09-03 DIAGNOSIS — M545 Low back pain, unspecified: Secondary | ICD-10-CM

## 2013-09-03 DIAGNOSIS — M5126 Other intervertebral disc displacement, lumbar region: Secondary | ICD-10-CM | POA: Insufficient documentation

## 2013-09-19 ENCOUNTER — Other Ambulatory Visit (HOSPITAL_COMMUNITY): Payer: Self-pay

## 2013-09-19 DIAGNOSIS — G4730 Sleep apnea, unspecified: Secondary | ICD-10-CM

## 2013-09-26 ENCOUNTER — Ambulatory Visit: Payer: Medicare HMO | Attending: Neurology | Admitting: Sleep Medicine

## 2013-09-26 VITALS — Ht 63.0 in | Wt 280.0 lb

## 2013-09-26 DIAGNOSIS — G4730 Sleep apnea, unspecified: Secondary | ICD-10-CM

## 2013-09-26 DIAGNOSIS — IMO0002 Reserved for concepts with insufficient information to code with codable children: Secondary | ICD-10-CM | POA: Insufficient documentation

## 2013-09-26 DIAGNOSIS — G4733 Obstructive sleep apnea (adult) (pediatric): Secondary | ICD-10-CM | POA: Insufficient documentation

## 2013-09-26 DIAGNOSIS — Z79899 Other long term (current) drug therapy: Secondary | ICD-10-CM | POA: Insufficient documentation

## 2013-09-27 NOTE — Sleep Study (Signed)
[place] A. Merlene Laughter, MD     [email or web address]        NOCTURNAL POLYSOMNOGRAM    LOCATION: SLEEP LAB FACILITY: [facility]   PHYSICIAN: Moreen Piggott A. Merlene Laughter, M.D.   DATE OF STUDY: 09/26/2013.   REFERRING PHYSICIAN: Gwendalynn Eckstrom.  INDICATIONS: This is a 48 year old female who presents with hypersomnia, snoring and difficulty falling asleep.  MEDICATIONS:  Prior to Admission medications   Medication Sig Start Date End Date Taking? Authorizing Provider  albuterol (PROVENTIL) (2.5 MG/3ML) 0.083% nebulizer solution Take 3 mLs (2.5 mg total) by nebulization every 6 (six) hours as needed for wheezing. 02/03/13   Tanda Rockers, MD  ALPRAZolam Duanne Moron) 0.5 MG tablet Take 1 tablet (0.5 mg total) by mouth 2 (two) times daily. 05/20/13 05/20/14  [name], MD  amoxicillin (AMOXIL) 500 MG capsule Take 2 capsules (1,000 mg total) by mouth 2 (two) times daily. 08/02/13   Virgel Manifold, MD  benzonatate (TESSALON) 100 MG capsule Take 1 capsule (100 mg total) by mouth every 8 (eight) hours. 08/02/13   Virgel Manifold, MD  citalopram (CELEXA) 40 MG tablet Take 1 tablet (40 mg total) by mouth every morning. 04/01/13 04/21/14  Alycia Rossetti, MD  famotidine (PEPCID) 20 MG tablet Take 20 mg by mouth at bedtime.    Historical Provider, MD  ferrous sulfate 325 (65 FE) MG tablet Take 1 tablet (325 mg total) by mouth 2 (two) times daily with a meal. 07/08/13   Alycia Rossetti, MD  fluticasone [facility]) 50 MCG/ACT nasal spray Place 2 sprays into the nose daily. 01/28/13   Alycia Rossetti, MD  ibuprofen (ADVIL,MOTRIN) 200 MG tablet Take 200-400 mg by mouth every 6 (six) hours as needed for pain. For pain    Historical Provider, MD  loperamide (IMODIUM A-D) 2 MG tablet Take 2 mg by mouth daily as needed for diarrhea or loose stools.    Historical Provider, MD  loratadine (CLARITIN) 10 MG tablet Take 1 tablet (10 mg total) by mouth daily. 03/25/13   Alycia Rossetti, MD  losartan (COZAAR) 50 MG  tablet TAKE ONE TABLET BY MOUTH ONCE DAILY 09/02/13   Alycia Rossetti, MD  metFORMIN (GLUCOPHAGE) 500 MG tablet Take 500 mg by mouth 2 (two) times daily with a meal.    Historical Provider, MD  metFORMIN (GLUCOPHAGE) 500 MG tablet TAKE ONE TABLET BY MOUTH TWICE DAILY WITH MEALS 08/05/13   Alycia Rossetti, MD  methocarbamol (ROBAXIN) 500 MG tablet Take 1 tablet (500 mg total) by mouth every 8 (eight) hours as needed for muscle spasms. 07/11/13   Alycia Rossetti, MD  methocarbamol (ROBAXIN) 500 MG tablet Take 1 tablet (500 mg total) by mouth 2 (two) times daily. 08/02/13   Virgel Manifold, MD  mometasone-formoterol (DULERA) 100-5 MCG/ACT AERO Inhale 2 puffs into the lungs 2 (two) times daily. 04/07/13   Alycia Rossetti, MD  ofloxacin (FLOXIN) 0.3 % otic solution Place 5 drops into the right ear daily. 08/02/13   Virgel Manifold, MD  omeprazole (PRILOSEC) 40 MG capsule Take 40 mg by mouth daily.    Historical Provider, MD  oxyCODONE-acetaminophen (PERCOCET/ROXICET) 5-325 MG per tablet Take 1 tablet by mouth every 4 (four) hours as needed for severe pain. 08/02/13   Virgel Manifold, MD  pravastatin (PRAVACHOL) 10 MG tablet Take 1 tablet (10 mg total) by mouth daily. 07/08/13   Alycia Rossetti, MD  predniSONE (DELTASONE) 20 MG tablet Take 2 tablets (40 mg total) by mouth daily.  08/02/13   Virgel Manifold, MD  pregabalin (LYRICA) 75 MG capsule Take 1 capsule (75 mg total) by mouth 2 (two) times daily. 05/27/13   Alycia Rossetti, MD      EPWORTH SLEEPINESS SCALE: 9.   BMI: 50.   ARCHITECTURAL SUMMARY: Total recording time 446 minutes. Sleep efficiency 79 %. Sleep latency 38 minutes. REM latency 254 minutes. Stage NI 11 %, N2 78 % and N3 1 % and REM sleep 11 %.    RESPIRATORY DATA:  Baseline oxygen saturation is 94 %. The lowest saturation is 80 %. The diagnostic AHI is 73. The RDI is 74. The REM AHI is 10. Mostly patient events occurred during the second half of the sleep recording and therefore a titration could  not be carried out.  LIMB MOVEMENT SUMMARY: PLM index 0.   ELECTROCARDIOGRAM SUMMARY: Average heart rate is 91 with no significant dysrhythmias observed.   IMPRESSION:  1. Severe obstructive sleep apnea syndrome. A formal CPAP titration recording or Auto-PAP is suggested.   Thanks for this referral.  Rollen Selders A. Merlene Laughter, M.D. Diplomat, Tax adviser of Sleep Medicine.

## 2013-10-03 ENCOUNTER — Encounter: Payer: Self-pay | Admitting: Family Medicine

## 2013-10-03 ENCOUNTER — Ambulatory Visit (INDEPENDENT_AMBULATORY_CARE_PROVIDER_SITE_OTHER): Payer: Medicare HMO | Admitting: Family Medicine

## 2013-10-03 VITALS — BP 130/80 | HR 100 | Temp 98.4°F | Resp 18 | Ht 64.0 in | Wt 276.0 lb

## 2013-10-03 DIAGNOSIS — F32A Depression, unspecified: Secondary | ICD-10-CM

## 2013-10-03 DIAGNOSIS — J45909 Unspecified asthma, uncomplicated: Secondary | ICD-10-CM

## 2013-10-03 DIAGNOSIS — R197 Diarrhea, unspecified: Secondary | ICD-10-CM

## 2013-10-03 DIAGNOSIS — G4730 Sleep apnea, unspecified: Secondary | ICD-10-CM

## 2013-10-03 DIAGNOSIS — E1149 Type 2 diabetes mellitus with other diabetic neurological complication: Secondary | ICD-10-CM

## 2013-10-03 DIAGNOSIS — F3289 Other specified depressive episodes: Secondary | ICD-10-CM

## 2013-10-03 DIAGNOSIS — J9610 Chronic respiratory failure, unspecified whether with hypoxia or hypercapnia: Secondary | ICD-10-CM

## 2013-10-03 DIAGNOSIS — R5381 Other malaise: Secondary | ICD-10-CM

## 2013-10-03 DIAGNOSIS — F329 Major depressive disorder, single episode, unspecified: Secondary | ICD-10-CM

## 2013-10-03 DIAGNOSIS — R5383 Other fatigue: Principal | ICD-10-CM

## 2013-10-03 LAB — HEMOGLOBIN A1C, FINGERSTICK: Hgb A1C (fingerstick): 6.1 % — ABNORMAL HIGH (ref <5.7)

## 2013-10-03 MED ORDER — PREDNISONE 20 MG PO TABS: 40.0000 mg | ORAL_TABLET | Freq: Every day | ORAL | Status: DC

## 2013-10-03 MED ORDER — LINAGLIPTIN 5 MG PO TABS: 5.0000 mg | ORAL_TABLET | Freq: Every day | ORAL | Status: DC

## 2013-10-03 MED ORDER — SULFAMETHOXAZOLE-TMP DS 800-160 MG PO TABS: 1.0000 | ORAL_TABLET | Freq: Two times a day (BID) | ORAL | Status: DC

## 2013-10-03 NOTE — Addendum Note (Signed)
Addended by: Vic Blackbird F on: 10/03/2013 05:28 PM   Modules accepted: Orders

## 2013-10-03 NOTE — Assessment & Plan Note (Signed)
Mild flare of asthma, she is going out of town, will send with script for prednisone and antibiotics, high risk pneuminia, respiratory failure

## 2013-10-03 NOTE — Progress Notes (Signed)
Patient ID: [name], female   DOB: 12-27-1965, 48 y.o.   MRN: [id number]   Subjective:    Patient ID: [name], female    DOB: 12-Jul-1965, 48 y.o.   MRN: [id number]  Patient presents for 3 month F/U and Increased fatigue  patient here to follow chronic medical problems. She has multiple concerns. She recently had a sleep study done which shows severe shortness sleep apnea she is due to followup with neurology in one week and they would be CPAP titration.  She also complains of daytime fatigue and not sleeping well. She does not feel overly depressed but feels like she just has no energy I reviewed her sleep study her oxygen sats were in the 80s and often she does not wear her oxygen at bedtime  She complains of continued diarrhea which is worsened since she had a change in her pain medications Lyrica and Nucynta. She's also taking metformin for diabetes mellitus  Medications reviewed she is doing well on her Celexa and rarely uses her Xanax she showed me a bottle that she has had for about a month and a half now  Asthma bronchitis she's had cough with she's production some mild wheezing over the past week. She's been using her inhalers as prescribed. She also inquired about a stress test because at times she does get chest pain which she less than a couple months ago when she was in the emergency room. At that time that she was diagnosed with bronchitis flare  Review Of Systems:  GEN- denies fatigue, fever, weight loss,weakness, recent illness HEENT- denies eye drainage, change in vision, nasal discharge, CVS- denies chest pain, palpitations RESP- denies SOB, cough, wheeze ABD- denies N/V, change in stools, abd pain GU- denies dysuria, hematuria, dribbling, incontinence MSK- + joint pain, muscle aches, injury Neuro- denies headache, dizziness, syncope, seizure activity       Objective:    BP 130/80  Pulse 100  Temp(Src) 98.4 F (36.9 C) (Oral)  Resp 18  Ht 5'  4" (1.626 m)  Wt 276 lb (125.193 kg)  BMI 47.35 kg/m2  SpO2 97%  LMP 09/05/2013 GEN- NAD, alert and oriented x3, obese HEENT- PERRL, EOMI, non injected sclera, pink conjunctiva, MMM, oropharynx clear Neck- Supple, no LAD CVS- RRR, no murmur RESP-bilateral wheeze, mild rhonchi, normal WOB, normal oxygen sat ABD-NABS,soft,mild TTP upper quadrants, no apparent distention Psych- flat affect, fatigued appearing, not overly depressed or anxious, no SI, normal speech EXT- No edema Pulses- Radial, DP- 2+        Assessment & Plan:      Problem List Items Addressed This Visit   Unspecified sleep apnea   Type II or unspecified type diabetes mellitus with neurological manifestations, not stated as uncontrolled - Primary   Relevant Orders      Comprehensive metabolic panel      CBC with Differential      LDL Cholesterol, Direct      Hemoglobin A1C, fingerstick   Diarrhea   Depression (Chronic)   Asthmatic bronchitis   Relevant Medications      predniSONE (DELTASONE) tablet      Note: This dictation was prepared with Dragon dictation along with smaller phrase technology. Any transcriptional errors that result from this process are unintentional.

## 2013-10-03 NOTE — Assessment & Plan Note (Signed)
Unchanged, to wear oxygen 2 L at bedtime

## 2013-10-03 NOTE — Assessment & Plan Note (Signed)
Followup with neurology for CPAP

## 2013-10-03 NOTE — Assessment & Plan Note (Signed)
Diabetes is well-controlled an A1c of 6.1% however with the diarrhea I would discontinue the metformin and put her on Tradjenta 5mg , she was given 1 month supply of medication

## 2013-10-03 NOTE — Patient Instructions (Addendum)
CPAP testing to be set up Stop the metformin Where your oxygen at night Start prednisone and antibiotics New diabetes medication- Tradjenta F/U 3 months

## 2013-10-03 NOTE — Assessment & Plan Note (Signed)
I think this is multifactorial she has hypoxia, chronic lung disease as well as obstructive sleep apnea obesity chronic pain depression I will first try to treat her sleep apnea and see how she improves. She may need cardiology input

## 2013-10-04 LAB — CBC WITH DIFFERENTIAL/PLATELET
Basophils Absolute: 0 10*3/uL (ref 0.0–0.1)
Basophils Relative: 0 % (ref 0–1)
Eosinophils Absolute: 0.1 10*3/uL (ref 0.0–0.7)
Eosinophils Relative: 1 % (ref 0–5)
HCT: 37.3 % (ref 36.0–46.0)
Hemoglobin: 11.3 g/dL — ABNORMAL LOW (ref 12.0–15.0)
Lymphocytes Relative: 28 % (ref 12–46)
Lymphs Abs: 3.2 10*3/uL (ref 0.7–4.0)
MCH: 20.7 pg — ABNORMAL LOW (ref 26.0–34.0)
MCHC: 30.3 g/dL (ref 30.0–36.0)
MCV: 68.4 fL — ABNORMAL LOW (ref 78.0–100.0)
Monocytes Absolute: 0.9 10*3/uL (ref 0.1–1.0)
Monocytes Relative: 8 % (ref 3–12)
Neutro Abs: 7.2 10*3/uL (ref 1.7–7.7)
Neutrophils Relative %: 63 % (ref 43–77)
Platelets: 323 10*3/uL (ref 150–400)
RBC: 5.45 MIL/uL — ABNORMAL HIGH (ref 3.87–5.11)
RDW: 17.5 % — ABNORMAL HIGH (ref 11.5–15.5)
WBC: 11.4 10*3/uL — ABNORMAL HIGH (ref 4.0–10.5)

## 2013-10-04 LAB — COMPREHENSIVE METABOLIC PANEL
ALT: <8 U/L (ref 0–35)
AST: 10 U/L (ref 0–37)
Albumin: 4.1 g/dL (ref 3.5–5.2)
Alkaline Phosphatase: 54 U/L (ref 39–117)
BUN: 8 mg/dL (ref 6–23)
CO2: 27 mEq/L (ref 19–32)
Calcium: 9.2 mg/dL (ref 8.4–10.5)
Chloride: 102 mEq/L (ref 96–112)
Creat: 0.76 mg/dL (ref 0.50–1.10)
Glucose, Bld: 96 mg/dL (ref 70–99)
Potassium: 4.5 mEq/L (ref 3.5–5.3)
Sodium: 139 mEq/L (ref 135–145)
Total Bilirubin: 0.5 mg/dL (ref 0.2–1.2)
Total Protein: 7.1 g/dL (ref 6.0–8.3)

## 2013-10-04 LAB — LDL CHOLESTEROL, DIRECT: Direct LDL: 121 mg/dL — ABNORMAL HIGH

## 2013-10-06 ENCOUNTER — Other Ambulatory Visit: Payer: Self-pay | Admitting: Family Medicine

## 2013-10-06 MED ORDER — ATORVASTATIN CALCIUM 10 MG PO TABS: 10.0000 mg | ORAL_TABLET | Freq: Every day | ORAL | Status: DC

## 2013-10-10 ENCOUNTER — Encounter: Payer: Self-pay | Admitting: Family Medicine

## 2013-10-10 MED ORDER — BENZONATATE 100 MG PO CAPS: 100.0000 mg | ORAL_CAPSULE | Freq: Two times a day (BID) | ORAL | Status: DC | PRN

## 2013-10-10 MED ORDER — PREDNISONE 20 MG PO TABS: 40.0000 mg | ORAL_TABLET | Freq: Every day | ORAL | Status: DC

## 2013-10-10 NOTE — Telephone Encounter (Signed)
Prescription sent to pharmacy.

## 2013-10-18 ENCOUNTER — Encounter (HOSPITAL_COMMUNITY): Payer: Self-pay | Admitting: Emergency Medicine

## 2013-10-18 ENCOUNTER — Emergency Department (HOSPITAL_COMMUNITY): Payer: Medicare HMO

## 2013-10-18 ENCOUNTER — Emergency Department (HOSPITAL_COMMUNITY)
Admission: EM | Admit: 2013-10-18 | Discharge: 2013-10-18 | Disposition: A | Discharge status: Home / Self Care | Payer: Medicare HMO | Attending: Emergency Medicine | Admitting: Emergency Medicine

## 2013-10-18 DIAGNOSIS — R0600 Dyspnea, unspecified: Secondary | ICD-10-CM

## 2013-10-18 DIAGNOSIS — J9610 Chronic respiratory failure, unspecified whether with hypoxia or hypercapnia: Secondary | ICD-10-CM | POA: Insufficient documentation

## 2013-10-18 DIAGNOSIS — I10 Essential (primary) hypertension: Secondary | ICD-10-CM | POA: Insufficient documentation

## 2013-10-18 DIAGNOSIS — F329 Major depressive disorder, single episode, unspecified: Secondary | ICD-10-CM | POA: Insufficient documentation

## 2013-10-18 DIAGNOSIS — F411 Generalized anxiety disorder: Secondary | ICD-10-CM | POA: Insufficient documentation

## 2013-10-18 DIAGNOSIS — J45901 Unspecified asthma with (acute) exacerbation: Secondary | ICD-10-CM | POA: Insufficient documentation

## 2013-10-18 DIAGNOSIS — R5383 Other fatigue: Secondary | ICD-10-CM

## 2013-10-18 DIAGNOSIS — R5381 Other malaise: Secondary | ICD-10-CM | POA: Insufficient documentation

## 2013-10-18 DIAGNOSIS — Z792 Long term (current) use of antibiotics: Secondary | ICD-10-CM | POA: Insufficient documentation

## 2013-10-18 DIAGNOSIS — K219 Gastro-esophageal reflux disease without esophagitis: Secondary | ICD-10-CM | POA: Insufficient documentation

## 2013-10-18 DIAGNOSIS — Z8739 Personal history of other diseases of the musculoskeletal system and connective tissue: Secondary | ICD-10-CM | POA: Insufficient documentation

## 2013-10-18 DIAGNOSIS — F17200 Nicotine dependence, unspecified, uncomplicated: Secondary | ICD-10-CM | POA: Insufficient documentation

## 2013-10-18 DIAGNOSIS — Z79899 Other long term (current) drug therapy: Secondary | ICD-10-CM | POA: Insufficient documentation

## 2013-10-18 DIAGNOSIS — Z9981 Dependence on supplemental oxygen: Secondary | ICD-10-CM | POA: Insufficient documentation

## 2013-10-18 DIAGNOSIS — IMO0002 Reserved for concepts with insufficient information to code with codable children: Secondary | ICD-10-CM | POA: Insufficient documentation

## 2013-10-18 DIAGNOSIS — Z862 Personal history of diseases of the blood and blood-forming organs and certain disorders involving the immune mechanism: Secondary | ICD-10-CM | POA: Insufficient documentation

## 2013-10-18 DIAGNOSIS — F3289 Other specified depressive episodes: Secondary | ICD-10-CM | POA: Insufficient documentation

## 2013-10-18 DIAGNOSIS — Z8742 Personal history of other diseases of the female genital tract: Secondary | ICD-10-CM | POA: Insufficient documentation

## 2013-10-18 LAB — COMPREHENSIVE METABOLIC PANEL
ALT: 10 U/L (ref 0–35)
AST: 9 U/L (ref 0–37)
Albumin: 2.9 g/dL — ABNORMAL LOW (ref 3.5–5.2)
Alkaline Phosphatase: 51 U/L (ref 39–117)
BUN: 10 mg/dL (ref 6–23)
CO2: 31 mEq/L (ref 19–32)
Calcium: 8.8 mg/dL (ref 8.4–10.5)
Chloride: 100 mEq/L (ref 96–112)
Creatinine, Ser: 0.71 mg/dL (ref 0.50–1.10)
GFR calc Af Amer: >90 mL/min (ref >90)
GFR calc non Af Amer: >90 mL/min (ref >90)
Glucose, Bld: 92 mg/dL (ref 70–99)
Potassium: 4 mEq/L (ref 3.7–5.3)
Sodium: 139 mEq/L (ref 137–147)
Total Bilirubin: <0.2 mg/dL — ABNORMAL LOW (ref 0.3–1.2)
Total Protein: 6.5 g/dL (ref 6.0–8.3)

## 2013-10-18 LAB — CBC WITH DIFFERENTIAL/PLATELET
Band Neutrophils: 0 % (ref 0–10)
Basophils Absolute: 0 10*3/uL (ref 0.0–0.1)
Basophils Relative: 0 % (ref 0–1)
Blasts: 0 %
Eosinophils Absolute: 0.1 10*3/uL (ref 0.0–0.7)
Eosinophils Relative: 1 % (ref 0–5)
HCT: 34.2 % — ABNORMAL LOW (ref 36.0–46.0)
Hemoglobin: 10.1 g/dL — ABNORMAL LOW (ref 12.0–15.0)
Lymphocytes Relative: 31 % (ref 12–46)
Lymphs Abs: 4.3 10*3/uL — ABNORMAL HIGH (ref 0.7–4.0)
MCH: 21 pg — ABNORMAL LOW (ref 26.0–34.0)
MCHC: 29.5 g/dL — ABNORMAL LOW (ref 30.0–36.0)
MCV: 71 fL — ABNORMAL LOW (ref 78.0–100.0)
Metamyelocytes Relative: 0 %
Monocytes Absolute: 1 10*3/uL (ref 0.1–1.0)
Monocytes Relative: 7 % (ref 3–12)
Myelocytes: 0 %
Neutro Abs: 8.5 10*3/uL — ABNORMAL HIGH (ref 1.7–7.7)
Neutrophils Relative %: 61 % (ref 43–77)
Platelets: 308 10*3/uL (ref 150–400)
Promyelocytes Absolute: 0 %
RBC: 4.82 MIL/uL (ref 3.87–5.11)
RDW: 17.5 % — ABNORMAL HIGH (ref 11.5–15.5)
WBC: 13.9 10*3/uL — ABNORMAL HIGH (ref 4.0–10.5)
nRBC: 0 /100 WBC

## 2013-10-18 LAB — URINALYSIS, ROUTINE W REFLEX MICROSCOPIC
Bilirubin Urine: NEGATIVE
Glucose, UA: NEGATIVE mg/dL
Hgb urine dipstick: NEGATIVE
Ketones, ur: NEGATIVE mg/dL
Leukocytes, UA: NEGATIVE
Nitrite: NEGATIVE
Protein, ur: NEGATIVE mg/dL
Specific Gravity, Urine: 1.02 (ref 1.005–1.030)
Urobilinogen, UA: 0.2 mg/dL (ref 0.0–1.0)
pH: 6 (ref 5.0–8.0)

## 2013-10-18 LAB — PRO B NATRIURETIC PEPTIDE: Pro B Natriuretic peptide (BNP): 264.4 pg/mL — ABNORMAL HIGH (ref 0–125)

## 2013-10-18 MED ORDER — ALBUTEROL SULFATE (2.5 MG/3ML) 0.083% IN NEBU
INHALATION_SOLUTION | RESPIRATORY_TRACT | Status: AC
  Administered 2013-10-18: 5 mg
  Filled 2013-10-18: qty 6

## 2013-10-18 MED ORDER — ALBUTEROL SULFATE (2.5 MG/3ML) 0.083% IN NEBU
5.0000 mg | INHALATION_SOLUTION | Freq: Once | RESPIRATORY_TRACT | Status: AC
  Administered 2013-10-18: 5 mg via RESPIRATORY_TRACT
  Filled 2013-10-18: qty 6

## 2013-10-18 MED ORDER — PREDNISONE 50 MG PO TABS
60.0000 mg | ORAL_TABLET | ORAL | Status: AC
  Administered 2013-10-18: 60 mg via ORAL
  Filled 2013-10-18 (×2): qty 1

## 2013-10-18 MED ORDER — IPRATROPIUM-ALBUTEROL 0.5-2.5 (3) MG/3ML IN SOLN
3.0000 mL | Freq: Once | RESPIRATORY_TRACT | Status: AC
  Administered 2013-10-18: 3 mL via RESPIRATORY_TRACT
  Filled 2013-10-18: qty 3

## 2013-10-18 MED ORDER — PREDNISONE 20 MG PO TABS: 40.0000 mg | ORAL_TABLET | Freq: Every day | ORAL | Status: AC

## 2013-10-18 NOTE — ED Notes (Signed)
Patient c/o productive cough with thick yellowish brown sputum. Patient aslo reports sinus pressure and congestion with headache and low grade fevers. Per patient was seen by PCP 2 weeks ago and given Bactrim, tessalon pearls, and prednisone for bronchitis-per patient was able to "breath a little bite better but came right back." PCP called in another 5 days of prednisone but no relief. Patient also reports having to wear O2 via [place]. Patient reports has sleep apnea but does not have bipap yet.

## 2013-10-18 NOTE — Discharge Instructions (Signed)
As discussed, it is important that you follow up as soon as possible with your physician for continued management of your condition.  If you develop any new, or concerning changes in your condition, please return to the emergency department immediately.  For the next two days, please be sure to use your nebulizer machine every four hours.

## 2013-10-18 NOTE — ED Notes (Signed)
C/o coughing and SOB.  Congestion.  Was treated via Dr Marzetta Merino with bactrim, prednisone.   Sputum brownish.  This has been going on for 3 weeks.  Have a history of PNA.  Had low grade fever but it has improved.

## 2013-10-18 NOTE — ED Provider Notes (Signed)
CSN: [id number]     Arrival date & time 10/18/13  1631 History   First MD Initiated Contact with Patient 10/18/13 1656     Chief Complaint  Patient presents with  . Cough  . Shortness of Breath     (Consider location/radiation/quality/duration/timing/severity/associated sxs/prior Treatment) HPI Patient presents with ongoing cough, congestion. Symptoms have been present for approximately 2 weeks, with minimal improvement following multiple rounds of steroids. No objective fever. There is mild associated generalized weakness. Patient continues to smoke.  She was advised of the necessity to stop.    No confusion tremors addition, vomiting, diarrhea, abdominal or chest pain.  On there is mild associated sinus congestion.  Past Medical History  Diagnosis Date  . Diabetes mellitus   . Asthmatic bronchitis     normal PFT/ seen by pulmonary no evidence of COPD  . HTN (hypertension)   . Low back pain   . Tachycardia     never had test done since no insurance  . Depression   . Shortness of breath   . Anxiety   . Gastric erosions     EGD 08/2010.  . Internal hemorrhoids     Colonoscopy 5/12.  [name] Heavy menses   . Chronic respiratory failure with hypoxia     On 2-3 L of oxygen at home  . GERD (gastroesophageal reflux disease)   . Anemia   . Arthritis   . Sleep apnea    Past Surgical History  Procedure Laterality Date  . Cholecystectomy  1990  . Cesarean section      twice  . Kidney surgery      as child for blockages  . Tubal ligation    . Tonsillectomy    . Wrist surgery  1995    Lt wrist  . Tympanostomy tube placement    . Hysteroscopy with thermachoice  01/17/2012    Procedure: HYSTEROSCOPY WITH THERMACHOICE;  Surgeon: Florian Buff, MD;  Location: [facility];  Service: Gynecology;  Laterality: N/A;  total therapy time: 9:13sec  D5W  18 ml in, D5W   60ml out, temperture 87degrees celcious  . Uterine ablation     Family History  Problem Relation Age of Onset  . Heart  attack Father 25    deceased, etoh use  . Heart disease Father   . Alcohol abuse Father   . Depression Father   . Heart attack Mother 81    deceased  . Diabetes Mother   . Breast cancer Mother   . Heart failure Mother     oxygen dependence, nonsmoker  . Heart disease Mother   . Depression Mother   . Cancer Mother   . Colon cancer Neg Hx   . Liver disease Maternal Aunt 50    died while on liver transplant list  . Heart attack Maternal Grandmother     premature CAD  . Ulcers Sister   . Hypertension Sister    History  Substance Use Topics  . Smoking status: Current Every Day Smoker -- 1.00 packs/day for 30 years    Types: Cigarettes    Last Attempt to Quit: 01/27/2013  . Smokeless tobacco: Never Used     Comment: using the vapor cigarettes  . Alcohol Use: Yes     Comment: social use   OB History   Grav Para Term Preterm Abortions TAB SAB Ect Mult Living   2 2 2       2      Review of Systems  Constitutional:  Per HPI, otherwise negative  HENT:       Per HPI, otherwise negative  Respiratory:       Per HPI, otherwise negative  Cardiovascular:       Per HPI, otherwise negative  Gastrointestinal: Negative for vomiting.  Endocrine:       Negative aside from HPI  Genitourinary:       Neg aside from HPI   Musculoskeletal:       Per HPI, otherwise negative  Skin: Negative.   Neurological: Negative for syncope.      Allergies  Codeine and Wellbutrin  Home Medications   Prior to Admission medications   Medication Sig Start Date End Date Taking? Authorizing Jerzy Roepke  albuterol (PROVENTIL) (2.5 MG/3ML) 0.083% nebulizer solution Take 3 mLs (2.5 mg total) by nebulization every 6 (six) hours as needed for wheezing. 02/03/13  Yes Tanda Rockers, MD  ALPRAZolam Duanne Moron) 0.5 MG tablet Take 0.5 mg by mouth at bedtime and may repeat dose one time if needed. Takes for sleep as needed. Allowed twice daily   Yes Historical Vondell Babers, MD  atorvastatin (LIPITOR) 10 MG tablet  Take 1 tablet (10 mg total) by mouth daily. 10/06/13  Yes Susy Frizzle, MD  benzonatate (TESSALON) 100 MG capsule Take 1 capsule (100 mg total) by mouth 2 (two) times daily as needed for cough. 10/10/13  Yes Alycia Rossetti, MD  citalopram (CELEXA) 40 MG tablet Take 1 tablet (40 mg total) by mouth every morning. 04/01/13 04/21/14 Yes Alycia Rossetti, MD  famotidine (PEPCID) 20 MG tablet Take 20 mg by mouth at bedtime.   Yes Historical Jahn Franchini, MD  fluticasone (FLONASE) 50 MCG/ACT nasal spray Place 2 sprays into the nose daily. 01/28/13  Yes Alycia Rossetti, MD  ibuprofen (ADVIL,MOTRIN) 200 MG tablet Take 200-400 mg by mouth every 6 (six) hours as needed for pain. For pain   Yes Historical Roxy Filler, MD  linagliptin (TRADJENTA) 5 MG TABS tablet Take 1 tablet (5 mg total) by mouth daily. 10/03/13  Yes Alycia Rossetti, MD  loperamide (IMODIUM A-D) 2 MG tablet Take 2 mg by mouth daily as needed for diarrhea or loose stools.   Yes Historical Khalani Novoa, MD  loratadine (CLARITIN) 10 MG tablet Take 1 tablet (10 mg total) by mouth daily. 03/25/13  Yes Alycia Rossetti, MD  losartan (COZAAR) 50 MG tablet Take 50 mg by mouth daily.   Yes Historical Adael Culbreath, MD  methocarbamol (ROBAXIN) 500 MG tablet Take 1 tablet (500 mg total) by mouth 2 (two) times daily. 08/02/13  Yes Virgel Manifold, MD  metoCLOPramide (REGLAN) 5 MG tablet Take 5 mg by mouth every 6 (six) hours as needed for nausea.   Yes Historical Sophiea Ueda, MD  mometasone-formoterol (DULERA) 100-5 MCG/ACT AERO Inhale 2 puffs into the lungs 2 (two) times daily. 04/07/13  Yes Alycia Rossetti, MD  omeprazole (PRILOSEC) 40 MG capsule Take 40 mg by mouth daily.   Yes Historical Thaily Hackworth, MD  pregabalin (LYRICA) 150 MG capsule Take 150 mg by mouth 2 (two) times daily.   Yes Historical Francesco Provencal, MD  tapentadol (NUCYNTA) 50 MG TABS tablet Take 50 mg by mouth 3 (three) times daily as needed for moderate pain or severe pain.    Yes Historical Nochum Fenter, MD   sulfamethoxazole-trimethoprim (BACTRIM DS) 800-160 MG per tablet Take 1 tablet by mouth 2 (two) times daily. 10/03/13   Alycia Rossetti, MD   BP 145/50  Pulse 106  Temp(Src) 98.2 F (36.8 C) (Oral)  Resp 26  Ht 5\' 4"  (1.626 m)  Wt 280 lb (127.007 kg)  BMI 48.04 kg/m2  SpO2 93%  LMP 09/05/2013 Physical Exam  Nursing note and vitals reviewed. Constitutional: She is oriented to person, place, and time. She appears well-developed and well-nourished. No distress.  HENT:  Head: Normocephalic and atraumatic.  Eyes: Conjunctivae and EOM are normal.  Cardiovascular: Normal rate and regular rhythm.   Pulmonary/Chest: Effort normal and breath sounds normal. No stridor. No respiratory distress.  Trace wheezes bilaterally  Abdominal: She exhibits no distension.  Musculoskeletal: She exhibits no edema.  Neurological: She is alert and oriented to person, place, and time. No cranial nerve deficit.  Skin: Skin is warm and dry.  Psychiatric: She has a normal mood and affect.    ED Course  Procedures (including critical care time) Labs Review Labs Reviewed  CBC WITH DIFFERENTIAL - Abnormal; Notable for the following:    WBC 13.9 (*)    Hemoglobin 10.1 (*)    HCT 34.2 (*)    MCV 71.0 (*)    MCH 21.0 (*)    MCHC 29.5 (*)    RDW 17.5 (*)    Neutro Abs 8.5 (*)    Lymphs Abs 4.3 (*)    All other components within normal limits  COMPREHENSIVE METABOLIC PANEL - Abnormal; Notable for the following:    Albumin 2.9 (*)    Total Bilirubin <0.2 (*)    All other components within normal limits  PRO B NATRIURETIC PEPTIDE - Abnormal; Notable for the following:    Pro B Natriuretic peptide (BNP) 264.4 (*)    All other components within normal limits  URINALYSIS, ROUTINE W REFLEX MICROSCOPIC    Imaging Review Dg Chest 2 View  10/18/2013   CLINICAL DATA:  48 year old female with shortness of breath cough and congestion. Wheezing. Initial encounter.  EXAM: CHEST  2 VIEW  COMPARISON:  08/02/2013  and earlier.  FINDINGS: Stable lung volumes. Stable cardiac size at the upper limits of normal. Other mediastinal contours are within normal limits. Visualized tracheal air column is within normal limits. Chronic increased interstitial markings. No pneumothorax, pleural effusion, pulmonary edema or acute pulmonary opacity. No acute osseous abnormality identified.  IMPRESSION: No acute cardiopulmonary abnormality. Chronic pulmonary interstitial changes.   Electronically Signed   By: Lars Pinks M.D.   On: 10/18/2013 18:18     EKG Interpretation   Date/Time:  Saturday October 18 2013 17:40:02 EDT Ventricular Rate:  90 PR Interval:  125 QRS Duration: 84 QT Interval:  364 QTC Calculation: 445 R Axis:   26 Text Interpretation:  Sinus rhythm Low voltage, extremity and precordial  leads Sinus rhythm Low voltage QRS Artifact No significant change since  last tracing Abnormal ekg Confirmed by Carmin Muskrat  MD [phone]) on  10/18/2013 6:11:02 PM      8:53 PM Patient awake and alert, no distress.  She is smiling.   MDM   Final diagnoses:  Dyspnea    Patient COPD presents with ongoing dyspnea, cough.  On exam patient awake alert, afebrile, though initially with wheezing, coarse breath sounds.  Patient clinically improved substantially here, though she remains mildly hypoxic, likely consistent with chronic lung disease.  The patient's evaluation is somewhat reassuring, with no critically abnormal values.  The patient has rendered her with and she is scheduled to follow up in 2 days, and was provided additional resources to follow up with pulmonology specialists.     Carmin Muskrat, MD 10/18/13 [phone]

## 2013-10-21 ENCOUNTER — Telehealth: Payer: Self-pay | Admitting: *Deleted

## 2013-10-21 NOTE — Telephone Encounter (Signed)
Received call from [facility] at Dr. Merlene Laughter office stating pt has [employer] and pt needs authorization from them, I submitted authorization thru [place] is still pending.

## 2013-10-31 ENCOUNTER — Other Ambulatory Visit: Payer: Self-pay | Admitting: Family Medicine

## 2013-10-31 ENCOUNTER — Encounter: Payer: Self-pay | Admitting: Family Medicine

## 2013-10-31 ENCOUNTER — Other Ambulatory Visit (HOSPITAL_COMMUNITY): Payer: Self-pay | Admitting: Psychiatry

## 2013-10-31 MED ORDER — BENZONATATE 100 MG PO CAPS: 100.0000 mg | ORAL_CAPSULE | Freq: Two times a day (BID) | ORAL | Status: DC | PRN

## 2013-10-31 MED ORDER — LINAGLIPTIN 5 MG PO TABS: 5.0000 mg | ORAL_TABLET | Freq: Every day | ORAL | Status: DC

## 2013-10-31 MED ORDER — METHOCARBAMOL 500 MG PO TABS: 500.0000 mg | ORAL_TABLET | Freq: Two times a day (BID) | ORAL | Status: DC

## 2013-10-31 MED ORDER — PREDNISONE 20 MG PO TABS: 40.0000 mg | ORAL_TABLET | Freq: Every day | ORAL | Status: DC

## 2013-10-31 NOTE — Telephone Encounter (Signed)
Refill appropriate and filled per protocol. 

## 2013-10-31 NOTE — Telephone Encounter (Signed)
Prescription sent to pharmacy per MD request.

## 2013-11-04 ENCOUNTER — Other Ambulatory Visit (HOSPITAL_COMMUNITY): Payer: Self-pay

## 2013-11-04 DIAGNOSIS — G4730 Sleep apnea, unspecified: Secondary | ICD-10-CM

## 2013-11-07 NOTE — Telephone Encounter (Signed)
authorizaton number [id number]

## 2013-11-09 ENCOUNTER — Ambulatory Visit: Payer: Medicare HMO | Attending: Neurology | Admitting: Sleep Medicine

## 2013-11-09 VITALS — Ht 63.0 in | Wt 282.0 lb

## 2013-11-09 DIAGNOSIS — G4733 Obstructive sleep apnea (adult) (pediatric): Secondary | ICD-10-CM | POA: Insufficient documentation

## 2013-11-09 DIAGNOSIS — G4730 Sleep apnea, unspecified: Secondary | ICD-10-CM

## 2013-11-09 DIAGNOSIS — R0609 Other forms of dyspnea: Secondary | ICD-10-CM | POA: Diagnosis present

## 2013-11-12 ENCOUNTER — Telehealth: Payer: Self-pay | Admitting: *Deleted

## 2013-11-12 NOTE — Telephone Encounter (Signed)
Submitted [employer] referral thru [employer] for authorization for Dr. Merlene Laughter office,pending authorization

## 2013-11-14 NOTE — Telephone Encounter (Signed)
Received fax from [facility] with authorization # [id number], information faxed to Dr. Merlene Laughter office for review.

## 2013-11-18 ENCOUNTER — Telehealth (HOSPITAL_COMMUNITY): Payer: Self-pay | Admitting: *Deleted

## 2013-11-19 MED ORDER — ALPRAZOLAM 0.5 MG PO TABS: 0.5000 mg | ORAL_TABLET | Freq: Two times a day (BID) | ORAL | Status: DC | PRN

## 2013-11-19 NOTE — Sleep Study (Signed)
[name]. Merlene Laughter, MD     [email or web address]        NOCTURNAL POLYSOMNOGRAM    LOCATION: SLEEP LAB FACILITY: [facility]   PHYSICIAN: [name], M.D.   DATE OF STUDY: 11/09/2013.   REFERRING PHYSICIAN: Murl [name].  INDICATIONS: The patient is a 48 year old who complains of hypersomnia, fatigue and snoring. The patient has a documented history of obstructive sleep apnea syndrome. This is a titration study.  MEDICATIONS:  Prior to Admission medications   Medication Sig Start Date End Date Taking? Authorizing Provider  albuterol (PROVENTIL) (2.5 MG/3ML) 0.083% nebulizer solution Take 3 mLs (2.5 mg total) by nebulization every 6 (six) hours as needed for wheezing. 02/03/13   Tanda Rockers, MD  ALPRAZolam Duanne Moron) 0.5 MG tablet Take 1 tablet (0.5 mg total) by mouth 2 (two) times daily as needed for anxiety. 11/19/13   [name], MD  atorvastatin (LIPITOR) 10 MG tablet Take 1 tablet (10 mg total) by mouth daily. 10/06/13   Susy Frizzle, MD  benzonatate (TESSALON) 100 MG capsule Take 1 capsule (100 mg total) by mouth 2 (two) times daily as needed for cough. 10/31/13   Alycia Rossetti, MD  citalopram (CELEXA) 40 MG tablet Take 1 tablet (40 mg total) by mouth every morning. 04/01/13 04/21/14  Alycia Rossetti, MD  EQ LORATADINE 10 MG tablet TAKE ONE TABLET BY MOUTH ONCE DAILY 10/31/13   Alycia Rossetti, MD  famotidine (PEPCID) 20 MG tablet Take 20 mg by mouth at bedtime.    Historical Provider, MD  fluticasone (FLONASE) 50 MCG/ACT nasal spray Place 2 sprays into the nose daily. 01/28/13   Alycia Rossetti, MD  ibuprofen (ADVIL,MOTRIN) 200 MG tablet Take 200-400 mg by mouth every 6 (six) hours as needed for pain. For pain    Historical Provider, MD  linagliptin (TRADJENTA) 5 MG TABS tablet Take 1 tablet (5 mg total) by mouth daily. 10/31/13   Alycia Rossetti, MD  loperamide (IMODIUM A-D) 2 MG tablet Take 2 mg by mouth daily as needed for diarrhea or loose  stools.    Historical Provider, MD  losartan (COZAAR) 50 MG tablet Take 50 mg by mouth daily.    Historical Provider, MD  methocarbamol (ROBAXIN) 500 MG tablet Take 1 tablet (500 mg total) by mouth 2 (two) times daily. 10/31/13   Alycia Rossetti, MD  metoCLOPramide (REGLAN) 5 MG tablet Take 5 mg by mouth every 6 (six) hours as needed for nausea.    Historical Provider, MD  mometasone-formoterol (DULERA) 100-5 MCG/ACT AERO Inhale 2 puffs into the lungs 2 (two) times daily. 04/07/13   Alycia Rossetti, MD  omeprazole (PRILOSEC) 40 MG capsule Take 40 mg by mouth daily.    Historical Provider, MD  predniSONE (DELTASONE) 20 MG tablet Take 2 tablets (40 mg total) by mouth daily with breakfast. 10/31/13   Alycia Rossetti, MD  pregabalin (LYRICA) 150 MG capsule Take 150 mg by mouth 2 (two) times daily.    Historical Provider, MD  sulfamethoxazole-trimethoprim (BACTRIM DS) 800-160 MG per tablet Take 1 tablet by mouth 2 (two) times daily. 10/03/13   Alycia Rossetti, MD  tapentadol (NUCYNTA) 50 MG TABS tablet Take 50 mg by mouth 3 (three) times daily as needed for moderate pain or severe pain.     Historical Provider, MD      EPWORTH SLEEPINESS SCALE: 11.   BMI: 50.   ARCHITECTURAL SUMMARY: Total recording time was 446 minutes. Sleep efficiency  78 %. Sleep latency 58 minutes. REM latency 135 minutes. Stage NI 7 %, N2 46 % and N3 20 % and REM sleep 28 %.    RESPIRATORY DATA:  Baseline oxygen saturation is 92 %. The lowest saturation is 77 %. The patient was placed on positive pressure starting at 5 and increased to 10. Optimal pressure is 10 with resolution of obstructive events and good tolerance.  LIMB MOVEMENT SUMMARY: PLM index 3.   ELECTROCARDIOGRAM SUMMARY: Average heart rate is 86 with no significant dysrhythmias observed.   IMPRESSION:  1. Obstructive sleep apnea syndrome which responds well to the CPAP of 10.  Thanks for this referral.  [name], M.D. Diplomat, Tax adviser of  Sleep Medicine.

## 2013-11-19 NOTE — Telephone Encounter (Signed)
Last RX and appt 05/20/13 Next appt 12/09/13 Dr Dwyane Dee (in Dr. Harrington Challenger' absence) authorized 30 day supply

## 2013-12-09 ENCOUNTER — Ambulatory Visit (INDEPENDENT_AMBULATORY_CARE_PROVIDER_SITE_OTHER): Payer: Medicare HMO | Admitting: Psychiatry

## 2013-12-09 ENCOUNTER — Encounter (HOSPITAL_COMMUNITY): Payer: Self-pay | Admitting: Psychiatry

## 2013-12-09 VITALS — BP 116/73 | HR 92 | Ht 63.0 in | Wt 287.6 lb

## 2013-12-09 DIAGNOSIS — F411 Generalized anxiety disorder: Secondary | ICD-10-CM

## 2013-12-09 DIAGNOSIS — F1910 Other psychoactive substance abuse, uncomplicated: Secondary | ICD-10-CM

## 2013-12-09 DIAGNOSIS — F332 Major depressive disorder, recurrent severe without psychotic features: Secondary | ICD-10-CM

## 2013-12-09 DIAGNOSIS — F331 Major depressive disorder, recurrent, moderate: Secondary | ICD-10-CM

## 2013-12-09 MED ORDER — METHYLPHENIDATE HCL 10 MG PO TABS: 10.0000 mg | ORAL_TABLET | Freq: Two times a day (BID) | ORAL | Status: DC

## 2013-12-09 MED ORDER — DULOXETINE HCL 60 MG PO CPEP: 60.0000 mg | ORAL_CAPSULE | Freq: Every day | ORAL | Status: DC
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
## 2013-12-12 ENCOUNTER — Encounter: Payer: Self-pay | Admitting: Family Medicine

## 2013-12-12 MED ORDER — BENZONATATE 100 MG PO CAPS
100.0000 mg | ORAL_CAPSULE | Freq: Two times a day (BID) | ORAL | Status: DC | PRN
Start: 2013-12-12 — End: 2014-01-15

## 2013-12-12 MED ORDER — METHOCARBAMOL 500 MG PO TABS: 500.0000 mg | ORAL_TABLET | Freq: Two times a day (BID) | ORAL | Status: DC

## 2013-12-22 ENCOUNTER — Telehealth: Payer: Self-pay | Admitting: *Deleted

## 2013-12-22 NOTE — Telephone Encounter (Signed)
Received fax from [facility] in with authorization [id number] for visits to Dr. Merlene Laughter for [facility] Sleep Apnea, form with authorization number has been faxed to [facility] office.

## 2014-01-06 ENCOUNTER — Encounter (HOSPITAL_COMMUNITY): Payer: Self-pay | Admitting: Psychiatry

## 2014-01-06 ENCOUNTER — Ambulatory Visit (INDEPENDENT_AMBULATORY_CARE_PROVIDER_SITE_OTHER): Payer: Medicare HMO | Admitting: Psychiatry

## 2014-01-06 VITALS — BP 155/69 | HR 98 | Ht 63.0 in | Wt 295.0 lb

## 2014-01-06 DIAGNOSIS — F331 Major depressive disorder, recurrent, moderate: Secondary | ICD-10-CM

## 2014-01-06 DIAGNOSIS — F332 Major depressive disorder, recurrent severe without psychotic features: Secondary | ICD-10-CM

## 2014-01-06 DIAGNOSIS — F1910 Other psychoactive substance abuse, uncomplicated: Secondary | ICD-10-CM

## 2014-01-06 DIAGNOSIS — F411 Generalized anxiety disorder: Secondary | ICD-10-CM

## 2014-01-06 MED ORDER — DULOXETINE HCL 60 MG PO CPEP: 60.0000 mg | ORAL_CAPSULE | Freq: Every day | ORAL | Status: DC

## 2014-01-06 MED ORDER — METHYLPHENIDATE HCL 20 MG PO TABS: 20.0000 mg | ORAL_TABLET | Freq: Two times a day (BID) | ORAL | Status: DC
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
## 2014-01-07 ENCOUNTER — Ambulatory Visit (HOSPITAL_COMMUNITY): Payer: Self-pay | Admitting: Psychology

## 2014-01-09 ENCOUNTER — Ambulatory Visit: Payer: Medicare HMO | Admitting: Family Medicine

## 2014-01-15 ENCOUNTER — Encounter (HOSPITAL_COMMUNITY): Payer: Self-pay | Admitting: Emergency Medicine

## 2014-01-15 ENCOUNTER — Emergency Department (HOSPITAL_COMMUNITY): Payer: Medicare HMO

## 2014-01-15 ENCOUNTER — Inpatient Hospital Stay (HOSPITAL_COMMUNITY)
Admission: EM | Admit: 2014-01-15 | Discharge: 2014-01-20 | DRG: 191 | Disposition: A | Discharge status: Home / Self Care | Payer: Medicare HMO | Attending: Internal Medicine | Admitting: Internal Medicine

## 2014-01-15 DIAGNOSIS — D649 Anemia, unspecified: Secondary | ICD-10-CM | POA: Diagnosis present

## 2014-01-15 DIAGNOSIS — Z8249 Family history of ischemic heart disease and other diseases of the circulatory system: Secondary | ICD-10-CM | POA: Diagnosis not present

## 2014-01-15 DIAGNOSIS — F32A Depression, unspecified: Secondary | ICD-10-CM

## 2014-01-15 DIAGNOSIS — D72829 Elevated white blood cell count, unspecified: Secondary | ICD-10-CM | POA: Diagnosis present

## 2014-01-15 DIAGNOSIS — E669 Obesity, unspecified: Secondary | ICD-10-CM | POA: Diagnosis present

## 2014-01-15 DIAGNOSIS — M129 Arthropathy, unspecified: Secondary | ICD-10-CM | POA: Diagnosis present

## 2014-01-15 DIAGNOSIS — F3289 Other specified depressive episodes: Secondary | ICD-10-CM

## 2014-01-15 DIAGNOSIS — F17200 Nicotine dependence, unspecified, uncomplicated: Secondary | ICD-10-CM | POA: Diagnosis present

## 2014-01-15 DIAGNOSIS — Z9981 Dependence on supplemental oxygen: Secondary | ICD-10-CM

## 2014-01-15 DIAGNOSIS — Z803 Family history of malignant neoplasm of breast: Secondary | ICD-10-CM | POA: Diagnosis not present

## 2014-01-15 DIAGNOSIS — Z79899 Other long term (current) drug therapy: Secondary | ICD-10-CM

## 2014-01-15 DIAGNOSIS — G8929 Other chronic pain: Secondary | ICD-10-CM | POA: Diagnosis present

## 2014-01-15 DIAGNOSIS — T380X5A Adverse effect of glucocorticoids and synthetic analogues, initial encounter: Secondary | ICD-10-CM | POA: Diagnosis present

## 2014-01-15 DIAGNOSIS — R0602 Shortness of breath: Secondary | ICD-10-CM | POA: Diagnosis present

## 2014-01-15 DIAGNOSIS — Z720 Tobacco use: Secondary | ICD-10-CM

## 2014-01-15 DIAGNOSIS — D509 Iron deficiency anemia, unspecified: Secondary | ICD-10-CM

## 2014-01-15 DIAGNOSIS — K219 Gastro-esophageal reflux disease without esophagitis: Secondary | ICD-10-CM

## 2014-01-15 DIAGNOSIS — J45901 Unspecified asthma with (acute) exacerbation: Secondary | ICD-10-CM | POA: Diagnosis not present

## 2014-01-15 DIAGNOSIS — Z833 Family history of diabetes mellitus: Secondary | ICD-10-CM | POA: Diagnosis not present

## 2014-01-15 DIAGNOSIS — J441 Chronic obstructive pulmonary disease with (acute) exacerbation: Secondary | ICD-10-CM | POA: Diagnosis present

## 2014-01-15 DIAGNOSIS — F329 Major depressive disorder, single episode, unspecified: Secondary | ICD-10-CM | POA: Diagnosis present

## 2014-01-15 DIAGNOSIS — J9610 Chronic respiratory failure, unspecified whether with hypoxia or hypercapnia: Secondary | ICD-10-CM | POA: Diagnosis present

## 2014-01-15 DIAGNOSIS — M545 Low back pain, unspecified: Secondary | ICD-10-CM | POA: Diagnosis present

## 2014-01-15 DIAGNOSIS — Z23 Encounter for immunization: Secondary | ICD-10-CM | POA: Diagnosis not present

## 2014-01-15 DIAGNOSIS — I10 Essential (primary) hypertension: Secondary | ICD-10-CM | POA: Diagnosis present

## 2014-01-15 DIAGNOSIS — F411 Generalized anxiety disorder: Secondary | ICD-10-CM | POA: Diagnosis present

## 2014-01-15 DIAGNOSIS — J209 Acute bronchitis, unspecified: Secondary | ICD-10-CM | POA: Diagnosis present

## 2014-01-15 DIAGNOSIS — E785 Hyperlipidemia, unspecified: Secondary | ICD-10-CM | POA: Diagnosis present

## 2014-01-15 DIAGNOSIS — Z6841 Body Mass Index (BMI) 40.0 and over, adult: Secondary | ICD-10-CM | POA: Diagnosis not present

## 2014-01-15 DIAGNOSIS — K2100 Gastro-esophageal reflux disease with esophagitis, without bleeding: Secondary | ICD-10-CM

## 2014-01-15 DIAGNOSIS — E119 Type 2 diabetes mellitus without complications: Secondary | ICD-10-CM | POA: Diagnosis present

## 2014-01-15 DIAGNOSIS — G4730 Sleep apnea, unspecified: Secondary | ICD-10-CM | POA: Diagnosis present

## 2014-01-15 DIAGNOSIS — E1149 Type 2 diabetes mellitus with other diabetic neurological complication: Secondary | ICD-10-CM

## 2014-01-15 LAB — CBC WITH DIFFERENTIAL/PLATELET
Basophils Absolute: 0.1 10*3/uL (ref 0.0–0.1)
Basophils Relative: 1 % (ref 0–1)
Eosinophils Absolute: 0.1 10*3/uL (ref 0.0–0.7)
Eosinophils Relative: 1 % (ref 0–5)
HCT: 35 % — ABNORMAL LOW (ref 36.0–46.0)
Hemoglobin: 10.2 g/dL — ABNORMAL LOW (ref 12.0–15.0)
Lymphocytes Relative: 25 % (ref 12–46)
Lymphs Abs: 2.3 10*3/uL (ref 0.7–4.0)
MCH: 20.6 pg — ABNORMAL LOW (ref 26.0–34.0)
MCHC: 29.1 g/dL — ABNORMAL LOW (ref 30.0–36.0)
MCV: 70.6 fL — ABNORMAL LOW (ref 78.0–100.0)
Monocytes Absolute: 0.7 10*3/uL (ref 0.1–1.0)
Monocytes Relative: 7 % (ref 3–12)
Neutro Abs: 6.2 10*3/uL (ref 1.7–7.7)
Neutrophils Relative %: 67 % (ref 43–77)
Platelets: 265 10*3/uL (ref 150–400)
RBC: 4.96 MIL/uL (ref 3.87–5.11)
RDW: 19.2 % — ABNORMAL HIGH (ref 11.5–15.5)
WBC: 9.4 10*3/uL (ref 4.0–10.5)

## 2014-01-15 LAB — BLOOD GAS, ARTERIAL
Acid-Base Excess: 1.1 mmol/L (ref 0.0–2.0)
Bicarbonate: 25.1 mEq/L — ABNORMAL HIGH (ref 20.0–24.0)
Drawn by: 22179
O2 Content: 2 L/min
O2 Saturation: 92.6 %
Patient temperature: 37
TCO2: 23.2 mmol/L (ref 0–100)
pCO2 arterial: 39.2 mmHg (ref 35.0–45.0)
pH, Arterial: 7.422 (ref 7.350–7.450)
pO2, Arterial: 67.8 mmHg — ABNORMAL LOW (ref 80.0–100.0)

## 2014-01-15 LAB — BASIC METABOLIC PANEL
Anion gap: 11 (ref 5–15)
BUN: 6 mg/dL (ref 6–23)
CO2: 27 mEq/L (ref 19–32)
Calcium: 9 mg/dL (ref 8.4–10.5)
Chloride: 101 mEq/L (ref 96–112)
Creatinine, Ser: 0.67 mg/dL (ref 0.50–1.10)
GFR calc Af Amer: >90 mL/min (ref >90)
GFR calc non Af Amer: >90 mL/min (ref >90)
Glucose, Bld: 99 mg/dL (ref 70–99)
Potassium: 4 mEq/L (ref 3.7–5.3)
Sodium: 139 mEq/L (ref 137–147)

## 2014-01-15 LAB — TROPONIN I
Troponin I: <0.3 ng/mL (ref <0.30)
Troponin I: <0.3 ng/mL (ref <0.30)

## 2014-01-15 LAB — GLUCOSE, CAPILLARY
Glucose-Capillary: 292 mg/dL — ABNORMAL HIGH (ref 70–99)
Glucose-Capillary: 285 mg/dL — ABNORMAL HIGH (ref 70–99)

## 2014-01-15 MED ORDER — PANTOPRAZOLE SODIUM 40 MG PO TBEC
40.0000 mg | DELAYED_RELEASE_TABLET | Freq: Every day | ORAL | Status: DC
  Administered 2014-01-16 – 2014-01-20 (×5): 40 mg via ORAL
  Filled 2014-01-15 (×5): qty 1

## 2014-01-15 MED ORDER — PREGABALIN 75 MG PO CAPS
150.0000 mg | ORAL_CAPSULE | Freq: Two times a day (BID) | ORAL | Status: DC
  Administered 2014-01-15 – 2014-01-20 (×10): 150 mg via ORAL
  Filled 2014-01-15 (×11): qty 2

## 2014-01-15 MED ORDER — NICOTINE 21 MG/24HR TD PT24
21.0000 mg | MEDICATED_PATCH | Freq: Every day | TRANSDERMAL | Status: DC
  Administered 2014-01-16 – 2014-01-20 (×5): 21 mg via TRANSDERMAL
  Filled 2014-01-15 (×5): qty 1

## 2014-01-15 MED ORDER — LOSARTAN POTASSIUM 50 MG PO TABS
50.0000 mg | ORAL_TABLET | Freq: Every day | ORAL | Status: DC
  Administered 2014-01-16 – 2014-01-20 (×5): 50 mg via ORAL
  Filled 2014-01-15 (×5): qty 1

## 2014-01-15 MED ORDER — POLYETHYLENE GLYCOL 3350 17 G PO PACK: 17.0000 g | PACK | Freq: Every day | ORAL | Status: DC | PRN

## 2014-01-15 MED ORDER — METHYLPREDNISOLONE SODIUM SUCC 125 MG IJ SOLR
60.0000 mg | Freq: Four times a day (QID) | INTRAMUSCULAR | Status: DC
  Administered 2014-01-15 – 2014-01-17 (×6): 60 mg via INTRAVENOUS
  Filled 2014-01-15 (×6): qty 2

## 2014-01-15 MED ORDER — METHYLPHENIDATE HCL 5 MG PO TABS
20.0000 mg | ORAL_TABLET | Freq: Two times a day (BID) | ORAL | Status: DC
  Administered 2014-01-16 – 2014-01-20 (×9): 20 mg via ORAL
  Filled 2014-01-15 (×9): qty 4

## 2014-01-15 MED ORDER — IPRATROPIUM BROMIDE 0.02 % IN SOLN: 0.5000 mg | Freq: Once | RESPIRATORY_TRACT | Status: DC

## 2014-01-15 MED ORDER — INSULIN ASPART 100 UNIT/ML ~~LOC~~ SOLN
0.0000 [IU] | Freq: Three times a day (TID) | SUBCUTANEOUS | Status: DC
  Administered 2014-01-15: 11 [IU] via SUBCUTANEOUS
  Administered 2014-01-16: 7 [IU] via SUBCUTANEOUS
  Administered 2014-01-16: 4 [IU] via SUBCUTANEOUS
  Administered 2014-01-16 – 2014-01-17 (×4): 7 [IU] via SUBCUTANEOUS
  Administered 2014-01-18: 11 [IU] via SUBCUTANEOUS
  Administered 2014-01-18: 7 [IU] via SUBCUTANEOUS
  Administered 2014-01-18 – 2014-01-19 (×2): 11 [IU] via SUBCUTANEOUS
  Administered 2014-01-19: 4 [IU] via SUBCUTANEOUS
  Administered 2014-01-19: 7 [IU] via SUBCUTANEOUS
  Administered 2014-01-20: 4 [IU] via SUBCUTANEOUS
  Administered 2014-01-20 (×2): 7 [IU] via SUBCUTANEOUS

## 2014-01-15 MED ORDER — DOXYCYCLINE HYCLATE 100 MG PO TABS
100.0000 mg | ORAL_TABLET | Freq: Two times a day (BID) | ORAL | Status: DC
  Administered 2014-01-15 – 2014-01-20 (×10): 100 mg via ORAL
  Filled 2014-01-15 (×10): qty 1

## 2014-01-15 MED ORDER — PREDNISONE 50 MG PO TABS
60.0000 mg | ORAL_TABLET | Freq: Once | ORAL | Status: AC
  Administered 2014-01-15: 60 mg via ORAL
  Filled 2014-01-15 (×2): qty 1

## 2014-01-15 MED ORDER — ASPIRIN EC 81 MG PO TBEC
81.0000 mg | DELAYED_RELEASE_TABLET | Freq: Every day | ORAL | Status: DC
Start: 2014-01-15 — End: 2014-01-15

## 2014-01-15 MED ORDER — MORPHINE SULFATE 4 MG/ML IJ SOLN
4.0000 mg | Freq: Once | INTRAMUSCULAR | Status: AC
  Administered 2014-01-15: 4 mg via INTRAVENOUS
  Filled 2014-01-15: qty 1

## 2014-01-15 MED ORDER — HEPARIN SODIUM (PORCINE) 5000 UNIT/ML IJ SOLN
5000.0000 [IU] | Freq: Three times a day (TID) | INTRAMUSCULAR | Status: DC
  Administered 2014-01-15 – 2014-01-20 (×14): 5000 [IU] via SUBCUTANEOUS
  Filled 2014-01-15 (×14): qty 1

## 2014-01-15 MED ORDER — IPRATROPIUM-ALBUTEROL 0.5-2.5 (3) MG/3ML IN SOLN
3.0000 mL | Freq: Once | RESPIRATORY_TRACT | Status: AC
  Administered 2014-01-15: 3 mL via RESPIRATORY_TRACT
  Filled 2014-01-15: qty 3

## 2014-01-15 MED ORDER — INFLUENZA VAC SPLIT QUAD 0.5 ML IM SUSY
0.5000 mL | PREFILLED_SYRINGE | INTRAMUSCULAR | Status: AC
  Administered 2014-01-16: 0.5 mL via INTRAMUSCULAR
  Filled 2014-01-15: qty 0.5

## 2014-01-15 MED ORDER — ONDANSETRON HCL 4 MG PO TABS
4.0000 mg | ORAL_TABLET | Freq: Four times a day (QID) | ORAL | Status: DC | PRN
Start: 2014-01-15 — End: 2014-01-20

## 2014-01-15 MED ORDER — ALBUTEROL SULFATE (2.5 MG/3ML) 0.083% IN NEBU: 5.0000 mg | INHALATION_SOLUTION | Freq: Once | RESPIRATORY_TRACT | Status: DC

## 2014-01-15 MED ORDER — METHYLPREDNISOLONE SODIUM SUCC 125 MG IJ SOLR
125.0000 mg | Freq: Once | INTRAMUSCULAR | Status: AC
  Administered 2014-01-15: 125 mg via INTRAVENOUS
  Filled 2014-01-15: qty 2

## 2014-01-15 MED ORDER — ATORVASTATIN CALCIUM 10 MG PO TABS
10.0000 mg | ORAL_TABLET | Freq: Every day | ORAL | Status: DC
  Administered 2014-01-16 – 2014-01-20 (×5): 10 mg via ORAL
  Filled 2014-01-15 (×5): qty 1

## 2014-01-15 MED ORDER — METHOCARBAMOL 500 MG PO TABS: 500.0000 mg | ORAL_TABLET | Freq: Two times a day (BID) | ORAL | Status: DC | PRN

## 2014-01-15 MED ORDER — INSULIN ASPART 100 UNIT/ML ~~LOC~~ SOLN
0.0000 [IU] | Freq: Every day | SUBCUTANEOUS | Status: DC
  Administered 2014-01-15 – 2014-01-16 (×2): 3 [IU] via SUBCUTANEOUS
  Administered 2014-01-17: 2 [IU] via SUBCUTANEOUS
  Administered 2014-01-18 – 2014-01-19 (×2): 3 [IU] via SUBCUTANEOUS

## 2014-01-15 MED ORDER — DULOXETINE HCL 30 MG PO CPEP
60.0000 mg | ORAL_CAPSULE | Freq: Every day | ORAL | Status: DC
  Administered 2014-01-16 – 2014-01-18 (×3): 60 mg via ORAL
  Filled 2014-01-15 (×3): qty 2

## 2014-01-15 MED ORDER — ONDANSETRON HCL 4 MG/2ML IJ SOLN: 4.0000 mg | Freq: Four times a day (QID) | INTRAMUSCULAR | Status: DC | PRN

## 2014-01-15 MED ORDER — IBUPROFEN 400 MG PO TABS
600.0000 mg | ORAL_TABLET | Freq: Four times a day (QID) | ORAL | Status: DC | PRN
  Administered 2014-01-15: 600 mg via ORAL
  Filled 2014-01-15: qty 2

## 2014-01-15 MED ORDER — SENNA 8.6 MG PO TABS
1.0000 | ORAL_TABLET | Freq: Two times a day (BID) | ORAL | Status: DC
  Administered 2014-01-15 – 2014-01-20 (×10): 8.6 mg via ORAL
  Filled 2014-01-15 (×10): qty 1

## 2014-01-15 MED ORDER — SODIUM CHLORIDE 0.45 % IV SOLN
INTRAVENOUS | Status: DC
  Administered 2014-01-15: 18:00:00 via INTRAVENOUS

## 2014-01-15 MED ORDER — IPRATROPIUM-ALBUTEROL 0.5-2.5 (3) MG/3ML IN SOLN
3.0000 mL | RESPIRATORY_TRACT | Status: DC | PRN
  Administered 2014-01-15 – 2014-01-20 (×14): 3 mL via RESPIRATORY_TRACT
  Filled 2014-01-15 (×15): qty 3

## 2014-01-15 MED ORDER — ALBUTEROL SULFATE (2.5 MG/3ML) 0.083% IN NEBU
2.5000 mg | INHALATION_SOLUTION | Freq: Once | RESPIRATORY_TRACT | Status: AC
  Administered 2014-01-15: 2.5 mg via RESPIRATORY_TRACT
  Filled 2014-01-15: qty 3

## 2014-01-15 MED ORDER — ALBUTEROL (5 MG/ML) CONTINUOUS INHALATION SOLN
10.0000 mg/h | INHALATION_SOLUTION | Freq: Once | RESPIRATORY_TRACT | Status: AC
  Administered 2014-01-15: 10 mg/h via RESPIRATORY_TRACT
  Filled 2014-01-15: qty 20

## 2014-01-15 MED ORDER — ALBUTEROL SULFATE (2.5 MG/3ML) 0.083% IN NEBU
5.0000 mg | INHALATION_SOLUTION | Freq: Once | RESPIRATORY_TRACT | Status: AC
  Administered 2014-01-15: 5 mg via RESPIRATORY_TRACT
  Filled 2014-01-15: qty 6

## 2014-01-15 MED ORDER — OXYCODONE HCL 5 MG PO TABS
5.0000 mg | ORAL_TABLET | ORAL | Status: DC | PRN
  Administered 2014-01-15 – 2014-01-16 (×4): 5 mg via ORAL
  Filled 2014-01-15 (×4): qty 1

## 2014-01-15 NOTE — ED Notes (Signed)
Pt complaining of cough and throat irritation for the past 3 weeks that has progressively gotten worse. Pt states that difficulty breathing started today.

## 2014-01-15 NOTE — ED Provider Notes (Signed)
CSN: [id number]     Arrival date & time 01/15/14  1046 History   First MD Initiated Contact with Patient 01/15/14 1101     Chief Complaint  Patient presents with  . Respiratory Distress     (Consider location/radiation/quality/duration/timing/severity/associated sxs/prior Treatment) Patient is a 48 y.o. female presenting with shortness of breath. The history is provided by the patient. No language interpreter was used.  Shortness of Breath Severity:  Severe Onset quality:  Gradual Duration:  2 weeks Progression:  Worsening Chronicity:  Recurrent Context: activity and smoke exposure   Associated symptoms: chest pain, cough and sore throat   Associated symptoms: no abdominal pain, no fever, no rash and no vomiting   Associated symptoms comment:  Increasing wheezing and shortness of breath, severe over the last 2 days. No known fever. She reports chest discomfort with breathing. She continues to smoke and states "I smoked more than usual this past weekend". She states activity/walking increases SOB. She tried using her nebulizer prior to arrival but did not gain any relief of symptoms.    Past Medical History  Diagnosis Date  . Diabetes mellitus   . Asthmatic bronchitis     normal PFT/ seen by pulmonary no evidence of COPD  . HTN (hypertension)   . Low back pain   . Tachycardia     never had test done since no insurance  . Depression   . Shortness of breath   . Anxiety   . Gastric erosions     EGD 08/2010.  . Internal hemorrhoids     Colonoscopy 5/12.  [name] Heavy menses   . Chronic respiratory failure with hypoxia     On 2-3 L of oxygen at home  . GERD (gastroesophageal reflux disease)   . Anemia   . Arthritis   . Sleep apnea   . COPD (chronic obstructive pulmonary disease)    Past Surgical History  Procedure Laterality Date  . Cholecystectomy  1990  . Cesarean section      twice  . Kidney surgery      as child for blockages  . Tubal ligation    . Tonsillectomy    .  Wrist surgery  1995    Lt wrist  . Tympanostomy tube placement    . Hysteroscopy with thermachoice  01/17/2012    Procedure: HYSTEROSCOPY WITH THERMACHOICE;  Surgeon: Florian Buff, MD;  Location: [facility];  Service: Gynecology;  Laterality: N/A;  total therapy time: 9:13sec  D5W  18 ml in, D5W   2ml out, temperture 87degrees celcious  . Uterine ablation     Family History  Problem Relation Age of Onset  . Heart attack Father 27    deceased, etoh use  . Heart disease Father   . Alcohol abuse Father   . Depression Father   . Heart attack Mother 42    deceased  . Diabetes Mother   . Breast cancer Mother   . Heart failure Mother     oxygen dependence, nonsmoker  . Heart disease Mother   . Depression Mother   . Cancer Mother   . Colon cancer Neg Hx   . Liver disease Maternal Aunt 80    died while on liver transplant list  . Heart attack Maternal Grandmother     premature CAD  . Ulcers Sister   . Hypertension Sister    History  Substance Use Topics  . Smoking status: Current Every Day Smoker -- 1.00 packs/day for 30 years  Types: Cigarettes    Last Attempt to Quit: 01/27/2013  . Smokeless tobacco: Never Used     Comment: using the vapor cigarettes  . Alcohol Use: Yes     Comment: social use   OB History   Grav Para Term Preterm Abortions TAB SAB Ect Mult Living   2 2 2       2      Review of Systems  Constitutional: Negative for fever and chills.  HENT: Positive for congestion and sore throat. Negative for trouble swallowing.   Respiratory: Positive for cough, chest tightness and shortness of breath.   Cardiovascular: Positive for chest pain.  Gastrointestinal: Negative.  Negative for vomiting and abdominal pain.  Musculoskeletal: Negative.  Negative for myalgias.  Skin: Negative.  Negative for rash.  Neurological: Negative.       Allergies  Codeine and Wellbutrin  Home Medications   Prior to Admission medications   Medication Sig Start Date End Date Taking?  Authorizing Provider  albuterol (PROVENTIL) (2.5 MG/3ML) 0.083% nebulizer solution Take 3 mLs (2.5 mg total) by nebulization every 6 (six) hours as needed for wheezing. 02/03/13  Yes Tanda Rockers, MD  atorvastatin (LIPITOR) 10 MG tablet Take 1 tablet (10 mg total) by mouth daily. 10/06/13  Yes Susy Frizzle, MD  DULoxetine (CYMBALTA) 60 MG capsule Take 1 capsule (60 mg total) by mouth daily. 01/06/14 01/06/15 Yes [name], MD  EQ LORATADINE 10 MG tablet TAKE ONE TABLET BY MOUTH ONCE DAILY 10/31/13  Yes Alycia Rossetti, MD  famotidine (PEPCID) 20 MG tablet Take 20 mg by mouth at bedtime.   Yes Historical Provider, MD  fluticasone (FLONASE) 50 MCG/ACT nasal spray Place 2 sprays into the nose daily. 01/28/13  Yes Alycia Rossetti, MD  ibuprofen (ADVIL,MOTRIN) 200 MG tablet Take 200-400 mg by mouth every 6 (six) hours as needed for pain. For pain   Yes Historical Provider, MD  linagliptin (TRADJENTA) 5 MG TABS tablet Take 1 tablet (5 mg total) by mouth daily. 10/31/13  Yes Alycia Rossetti, MD  loperamide (IMODIUM A-D) 2 MG tablet Take 2 mg by mouth daily as needed for diarrhea or loose stools.   Yes Historical Provider, MD  losartan (COZAAR) 50 MG tablet Take 50 mg by mouth daily.   Yes Historical Provider, MD  methocarbamol (ROBAXIN) 500 MG tablet Take 1 tablet (500 mg total) by mouth 2 (two) times daily. 12/12/13  Yes Alycia Rossetti, MD  methylphenidate (RITALIN) 20 MG tablet Take 1 tablet (20 mg total) by mouth 2 (two) times daily with breakfast and lunch. 01/06/14  Yes [name], MD  mometasone-formoterol (DULERA) 100-5 MCG/ACT AERO Inhale 2 puffs into the lungs 2 (two) times daily. 04/07/13  Yes Alycia Rossetti, MD  ofloxacin (FLOXIN) 0.3 % otic solution Place 5 drops into the right ear daily.   Yes Historical Provider, MD  omeprazole (PRILOSEC) 40 MG capsule Take 40 mg by mouth daily.   Yes Historical Provider, MD  pregabalin (LYRICA) 150 MG capsule Take 150 mg by mouth 2 (two) times  daily.   Yes Historical Provider, MD  tapentadol (NUCYNTA) 50 MG TABS tablet Take 50 mg by mouth 3 (three) times daily as needed for moderate pain or severe pain.    Yes Historical Provider, MD  Vitamin D, Ergocalciferol, (DRISDOL) 50000 UNITS CAPS capsule Take 50,000 Units by mouth every 7 (seven) days. On Thursday.   Yes Historical Provider, MD   BP 134/57  Pulse 103  Temp(Src)  98.7 F (37.1 C) (Oral)  Resp 18  Ht 5\' 4"  (1.626 m)  Wt 290 lb (131.543 kg)  BMI 49.75 kg/m2  SpO2 96%  LMP 12/15/2013 Physical Exam  Constitutional: She is oriented to person, place, and time. She appears well-developed and well-nourished.  HENT:  Head: Normocephalic.  Neck: Normal range of motion. Neck supple.  Cardiovascular: Normal rate and regular rhythm.   Pulmonary/Chest: Effort normal and breath sounds normal.  Inspiratory and expiratory wheezing throughout lung fields with decreased air movement.   Abdominal: Soft. Bowel sounds are normal. There is no tenderness. There is no rebound and no guarding.  Musculoskeletal: Normal range of motion.  Neurological: She is alert and oriented to person, place, and time.  Skin: Skin is warm and dry. No rash noted.  Psychiatric: She has a normal mood and affect.    ED Course  Procedures (including critical care time) Labs Review Labs Reviewed  CBC WITH DIFFERENTIAL - Abnormal; Notable for the following:    Hemoglobin 10.2 (*)    HCT 35.0 (*)    MCV 70.6 (*)    MCH 20.6 (*)    MCHC 29.1 (*)    RDW 19.2 (*)    All other components within normal limits  BASIC METABOLIC PANEL   Results for orders placed during the hospital encounter of 01/15/14  CBC WITH DIFFERENTIAL      Result Value Ref Range   WBC 9.4  4.0 - 10.5 K/uL   RBC 4.96  3.87 - 5.11 MIL/uL   Hemoglobin 10.2 (*) 12.0 - 15.0 g/dL   HCT 35.0 (*) 36.0 - 46.0 %   MCV 70.6 (*) 78.0 - 100.0 fL   MCH 20.6 (*) 26.0 - 34.0 pg   MCHC 29.1 (*) 30.0 - 36.0 g/dL   RDW 19.2 (*) 11.5 - 15.5 %    Platelets 265  150 - 400 K/uL   Neutrophils Relative % 67  43 - 77 %   Neutro Abs 6.2  1.7 - 7.7 K/uL   Lymphocytes Relative 25  12 - 46 %   Lymphs Abs 2.3  0.7 - 4.0 K/uL   Monocytes Relative 7  3 - 12 %   Monocytes Absolute 0.7  0.1 - 1.0 K/uL   Eosinophils Relative 1  0 - 5 %   Eosinophils Absolute 0.1  0.0 - 0.7 K/uL   Basophils Relative 1  0 - 1 %   Basophils Absolute 0.1  0.0 - 0.1 K/uL  BASIC METABOLIC PANEL      Result Value Ref Range   Sodium 139  137 - 147 mEq/L   Potassium 4.0  3.7 - 5.3 mEq/L   Chloride 101  96 - 112 mEq/L   CO2 27  19 - 32 mEq/L   Glucose, Bld 99  70 - 99 mg/dL   BUN 6  6 - 23 mg/dL   Creatinine, Ser 0.67  0.50 - 1.10 mg/dL   Calcium 9.0  8.4 - 10.5 mg/dL   GFR calc non Af Amer >90  >90 mL/min   GFR calc Af Amer >90  >90 mL/min   Anion gap 11  5 - 15    Imaging Review Dg Chest 2 View  01/15/2014   CLINICAL DATA:  Two weeks of cough with 1 week of shortness of breath ; history of COPD, diabetes, and hypertension.  EXAM: CHEST  2 VIEW  COMPARISON:  PA and lateral chest of October 18, 2013  FINDINGS: The lungs are well-expanded.  There is mild hemidiaphragm flattening. The heart is normal in size. There are coarse infrahilar lung markings bilaterally which are not entirely new. The pulmonary vascularity is not engorged. The mediastinum is normal in width. There is no pleural effusion. The bony thorax is unremarkable.  IMPRESSION: COPD. Chronically infrahilar lung markings bilaterally likely reflects scarring. One cannot exclude superimposed acute bronchitis in the appropriate clinical setting.   Electronically Signed   By: David  Martinique   On: 01/15/2014 12:05     EKG Interpretation   Date/Time:  Thursday January 15 2014 11:11:06 EDT Ventricular Rate:  91 PR Interval:  125 QRS Duration: 88 QT Interval:  357 QTC Calculation: 439 R Axis:   35 Text Interpretation:  Sinus rhythm Confirmed by WARD,  DO, KRISTEN ([id number])  on 01/15/2014 11:18:14 AM       MDM   Final diagnoses:  None    1. COPD exacerbation 2. Tobacco abuse  The patient is minimally improved with multiple nebulizer treatments in ED. On re-evaluation after 2nd treatment, O2 sat found to be 92% with prevalent continued wheezing. No acute infiltrates on x-ray. Plan to given hour long 10 mg albuterol nebulizer and reassess. Feel admission is likely.  Re-evaluation after one hour nebulizer treatment: she continues to have wheezing and is subjectively SOB. Will consult [facility] for admission.  Dewaine Oats, PA-C 01/15/14 1539

## 2014-01-15 NOTE — ED Notes (Addendum)
Pt presents with audible wheezes. Hx of asthma and COPD. O2 is 96% on room air. Pt reports "doing a double breathing treatment this morning at 2am without much relief." Pt reports "beginning to feel tight this weekend." Pt has non productive strong cough. Pt reports chest pain "r/t cough." Pt denies any other symptoms at this time.

## 2014-01-15 NOTE — ED Notes (Signed)
Pt's O2 saturation dropped to 78% on room air. Pt placed on 2L O2 nasal cannula. Pt O2 saturation now 95%.

## 2014-01-15 NOTE — ED Provider Notes (Signed)
Medical screening examination/treatment/procedure(s) were performed by non-physician practitioner and as supervising physician I was immediately available for consultation/collaboration.   EKG Interpretation   Date/Time:  Thursday January 15 2014 11:11:06 EDT Ventricular Rate:  91 PR Interval:  125 QRS Duration: 88 QT Interval:  357 QTC Calculation: 439 R Axis:   35 Text Interpretation:  Sinus rhythm Confirmed by Uchenna Seufert,  DO, Ceili Boshers ([id number])  on 01/15/2014 11:18:14 AM        Delice Bison Shawnay Bramel, DO 01/15/14 1547

## 2014-01-15 NOTE — H&P (Addendum)
[facility] History and Physical  [name]:[id number] DOB: 03/01/1966 DOA: 01/15/2014  Referring physician: Charlann Lange - [place] PCP: Vic Blackbird, MD  Specialists: none  Chief Complaint: SOB  Assessment/Plan  Principle problem COPD exacerbation: likely secondary to change in weather adn increased smoking. O2 sats on [place] ranging from 85-96. Increased WOB. CAT x1 hr and oral prednisone 60mg  x1.  - Admit to [facility] 1 - ABG - solumedrol 125 x1 then 60mg  Q6 - Duonebs Q4/Q2prn - Doxy - O2; CPAP - hold home Dulera (restart prior to DC).  - IVF 1/2NS 17ml/hr  Chest pain: MSK vs COPD exacerbation/pleuritic. Worse w/ deep inspiration. CXR neg for pneumothorax, PNA, rib fracture. Unlikely cardiac. EKG NSR no signs of ischemia - Troponins (0/3) - NSAIDs -   DM: Last A1c 6.7 on 07/02/13. Home glucose in the 130-150 range per pt. Anticipate increase in overall CBG due to steroids - SSI - A1c Hold home tradjenta,   HTN: SBP 130s-140s on admission - cont home losartan - cont lipitor  Tobacco abuse: continues to smoke, but significantly decreased since on set of symptoms.  - nicoderm patch 21mg   GERD: - cont PPI  Chronic pain: stable  - cont robaxin, lyrica,  - Oxycodone (hold home nucynta)  Depression/Anxiety - continue home cymbalta, ritalin,   DVT Prophylaxis:  [place] Hep   Code Status: FULL Family Communication: No family present for admission Disposition Plan: Pending clinical improvemetn  HPI: [name] is a 48 y.o. female came to [facility] ed 01/15/2014 with progressive SOB and cough. Gradual worsenin for weeks. Significantly worsened when went to beach this past weekend. Improved w/ home breathing treatments but O2 was as low as 85 at home. CPAP last night but still felt SOB. Does not wear O2 during the day. Smoking 1ppd. Denies fevers, palpitations. Associated w/ CP which comes on w/ deep breathing.   CBG around the 130-150 range  Review of  Systems: Per HPI w/ all other systems negative.   Past Medical History  Diagnosis Date  . Diabetes mellitus   . Asthmatic bronchitis     normal PFT/ seen by pulmonary no evidence of COPD  . HTN (hypertension)   . Low back pain   . Tachycardia     never had test done since no insurance  . Depression   . Shortness of breath   . Anxiety   . Gastric erosions     EGD 08/2010.  . Internal hemorrhoids     Colonoscopy 5/12.  [name] Heavy menses   . Chronic respiratory failure with hypoxia     On 2-3 L of oxygen at home  . GERD (gastroesophageal reflux disease)   . Anemia   . Arthritis   . Sleep apnea   . COPD (chronic obstructive pulmonary disease)    Past Surgical History  Procedure Laterality Date  . Cholecystectomy  1990  . Cesarean section      twice  . Kidney surgery      as child for blockages  . Tubal ligation    . Tonsillectomy    . Wrist surgery  1995    Lt wrist  . Tympanostomy tube placement    . Hysteroscopy with thermachoice  01/17/2012    Procedure: HYSTEROSCOPY WITH THERMACHOICE;  Surgeon: Florian Buff, MD;  Location: [facility];  Service: Gynecology;  Laterality: N/A;  total therapy time: 9:13sec  D5W  18 ml in, D5W   19ml out, temperture 87degrees celcious  .  Uterine ablation     Social History:  History   Social History Narrative  . No narrative on file    Allergies  Allergen Reactions  . Codeine Itching and Nausea Only  . Wellbutrin [Bupropion Hcl] Hives    Family History  Problem Relation Age of Onset  . Heart attack Father 64    deceased, etoh use  . Heart disease Father   . Alcohol abuse Father   . Depression Father   . Heart attack Mother 47    deceased  . Diabetes Mother   . Breast cancer Mother   . Heart failure Mother     oxygen dependence, nonsmoker  . Heart disease Mother   . Depression Mother   . Cancer Mother   . Colon cancer Neg Hx   . Liver disease Maternal Aunt 75    died while on liver transplant list  . Heart attack Maternal  Grandmother     premature CAD  . Ulcers Sister   . Hypertension Sister      Prior to Admission medications   Medication Sig Start Date End Date Taking? Authorizing Provider  albuterol (PROVENTIL) (2.5 MG/3ML) 0.083% nebulizer solution Take 3 mLs (2.5 mg total) by nebulization every 6 (six) hours as needed for wheezing. 02/03/13  Yes Tanda Rockers, MD  atorvastatin (LIPITOR) 10 MG tablet Take 1 tablet (10 mg total) by mouth daily. 10/06/13  Yes Susy Frizzle, MD  DULoxetine (CYMBALTA) 60 MG capsule Take 1 capsule (60 mg total) by mouth daily. 01/06/14 01/06/15 Yes [name], MD  EQ LORATADINE 10 MG tablet TAKE ONE TABLET BY MOUTH ONCE DAILY 10/31/13  Yes Alycia Rossetti, MD  famotidine (PEPCID) 20 MG tablet Take 20 mg by mouth at bedtime.   Yes Historical Provider, MD  fluticasone (FLONASE) 50 MCG/ACT nasal spray Place 2 sprays into the nose daily. 01/28/13  Yes Alycia Rossetti, MD  ibuprofen (ADVIL,MOTRIN) 200 MG tablet Take 200-400 mg by mouth every 6 (six) hours as needed for pain. For pain   Yes Historical Provider, MD  linagliptin (TRADJENTA) 5 MG TABS tablet Take 1 tablet (5 mg total) by mouth daily. 10/31/13  Yes Alycia Rossetti, MD  loperamide (IMODIUM A-D) 2 MG tablet Take 2 mg by mouth daily as needed for diarrhea or loose stools.   Yes Historical Provider, MD  losartan (COZAAR) 50 MG tablet Take 50 mg by mouth daily.   Yes Historical Provider, MD  methocarbamol (ROBAXIN) 500 MG tablet Take 1 tablet (500 mg total) by mouth 2 (two) times daily. 12/12/13  Yes Alycia Rossetti, MD  methylphenidate (RITALIN) 20 MG tablet Take 1 tablet (20 mg total) by mouth 2 (two) times daily with breakfast and lunch. 01/06/14  Yes [name], MD  mometasone-formoterol (DULERA) 100-5 MCG/ACT AERO Inhale 2 puffs into the lungs 2 (two) times daily. 04/07/13  Yes Alycia Rossetti, MD  ofloxacin (FLOXIN) 0.3 % otic solution Place 5 drops into the right ear daily.   Yes Historical Provider, MD  omeprazole  (PRILOSEC) 40 MG capsule Take 40 mg by mouth daily.   Yes Historical Provider, MD  pregabalin (LYRICA) 150 MG capsule Take 150 mg by mouth 2 (two) times daily.   Yes Historical Provider, MD  tapentadol (NUCYNTA) 50 MG TABS tablet Take 50 mg by mouth 3 (three) times daily as needed for moderate pain or severe pain.    Yes Historical Provider, MD  Vitamin D, Ergocalciferol, (DRISDOL) 50000 UNITS CAPS capsule  Take 50,000 Units by mouth every 7 (seven) days. On Thursday.   Yes Historical Provider, MD   Physical Exam: Filed Vitals:   01/15/14 1500 01/15/14 1506 01/15/14 1525 01/15/14 1530  BP: 134/57 134/57  142/67  Pulse: 103 103  119  Temp:      TempSrc:      Resp: 28 18  16   Height:      Weight:      SpO2: 97% 96% 88% 98%     General:  Mild distress  Eyes: EOMI, PERRL  ENT: mmm  Neck: FROM  Cardiovascular: RRR, no m/r/g  Respiratory: wheezing throughout w/ diminished breath sounds especially in the bases. Increased WOB on [place]  Abdomen: soft , nonttp  Skin: intact, warm  Musculoskeletal: FROM, moves all extremities well  Psychiatric: nnml affect  Neurologic: CN2-12 grossly intact. Cerebellar fxn nml  Labs on Admission:  Basic Metabolic Panel:  Recent Labs Lab 01/15/14 1150  NA 139  K 4.0  CL 101  CO2 27  GLUCOSE 99  BUN 6  CREATININE 0.67  CALCIUM 9.0   Liver Function Tests: No results found for this basename: AST, ALT, ALKPHOS, BILITOT, PROT, ALBUMIN,  in the last 168 hours No results found for this basename: LIPASE, AMYLASE,  in the last 168 hours No results found for this basename: AMMONIA,  in the last 168 hours CBC:  Recent Labs Lab 01/15/14 1150  WBC 9.4  NEUTROABS 6.2  HGB 10.2*  HCT 35.0*  MCV 70.6*  PLT 265   Cardiac Enzymes: No results found for this basename: CKTOTAL, CKMB, CKMBINDEX, TROPONINI,  in the last 168 hours  BNP (last 3 results)  Recent Labs  10/18/13 1722  PROBNP 264.4*   CBG: No results found for this basename:  GLUCAP,  in the last 168 hours  Radiological Exams on Admission: Dg Chest 2 View  01/15/2014   CLINICAL DATA:  Two weeks of cough with 1 week of shortness of breath ; history of COPD, diabetes, and hypertension.  EXAM: CHEST  2 VIEW  COMPARISON:  PA and lateral chest of October 18, 2013  FINDINGS: The lungs are well-expanded. There is mild hemidiaphragm flattening. The heart is normal in size. There are coarse infrahilar lung markings bilaterally which are not entirely new. The pulmonary vascularity is not engorged. The mediastinum is normal in width. There is no pleural effusion. The bony thorax is unremarkable.  IMPRESSION: COPD. Chronically infrahilar lung markings bilaterally likely reflects scarring. One cannot exclude superimposed acute bronchitis in the appropriate clinical setting.   Electronically Signed   By: David  Martinique   On: 01/15/2014 12:05       Time spent: > 70 min spent in direct pt care and coordination.    MERRELL, Grayling Congress, MD [facility] [email or web address] Password [email or web address] 01/15/2014, 4:02 PM

## 2014-01-16 DIAGNOSIS — J441 Chronic obstructive pulmonary disease with (acute) exacerbation: Secondary | ICD-10-CM | POA: Diagnosis not present

## 2014-01-16 DIAGNOSIS — K219 Gastro-esophageal reflux disease without esophagitis: Secondary | ICD-10-CM

## 2014-01-16 DIAGNOSIS — Z9981 Dependence on supplemental oxygen: Secondary | ICD-10-CM

## 2014-01-16 LAB — GLUCOSE, CAPILLARY
Glucose-Capillary: 178 mg/dL — ABNORMAL HIGH (ref 70–99)
Glucose-Capillary: 247 mg/dL — ABNORMAL HIGH (ref 70–99)
Glucose-Capillary: 219 mg/dL — ABNORMAL HIGH (ref 70–99)
Glucose-Capillary: 265 mg/dL — ABNORMAL HIGH (ref 70–99)

## 2014-01-16 LAB — COMPREHENSIVE METABOLIC PANEL
ALT: 9 U/L (ref 0–35)
AST: 10 U/L (ref 0–37)
Albumin: 3.3 g/dL — ABNORMAL LOW (ref 3.5–5.2)
Alkaline Phosphatase: 58 U/L (ref 39–117)
Anion gap: 11 (ref 5–15)
BUN: 9 mg/dL (ref 6–23)
CO2: 27 mEq/L (ref 19–32)
Calcium: 9 mg/dL (ref 8.4–10.5)
Chloride: 101 mEq/L (ref 96–112)
Creatinine, Ser: 0.71 mg/dL (ref 0.50–1.10)
GFR calc Af Amer: >90 mL/min (ref >90)
GFR calc non Af Amer: >90 mL/min (ref >90)
Glucose, Bld: 215 mg/dL — ABNORMAL HIGH (ref 70–99)
Potassium: 4.4 mEq/L (ref 3.7–5.3)
Sodium: 139 mEq/L (ref 137–147)
Total Bilirubin: 0.3 mg/dL (ref 0.3–1.2)
Total Protein: 7.1 g/dL (ref 6.0–8.3)

## 2014-01-16 LAB — TROPONIN I: Troponin I: <0.3 ng/mL (ref <0.30)

## 2014-01-16 LAB — HEMOGLOBIN A1C
Hgb A1c MFr Bld: 6.9 % — ABNORMAL HIGH (ref <5.7)
Mean Plasma Glucose: 151 mg/dL — ABNORMAL HIGH (ref <117)

## 2014-01-16 LAB — MRSA PCR SCREENING: MRSA by PCR: NEGATIVE

## 2014-01-16 MED ORDER — HYDROCOD POLST-CHLORPHEN POLST 10-8 MG/5ML PO LQCR
5.0000 mL | Freq: Two times a day (BID) | ORAL | Status: DC | PRN
  Administered 2014-01-16 – 2014-01-18 (×4): 5 mL via ORAL
  Filled 2014-01-16 (×4): qty 5

## 2014-01-16 MED ORDER — OXYCODONE-ACETAMINOPHEN 5-325 MG PO TABS
1.0000 | ORAL_TABLET | ORAL | Status: DC | PRN
  Administered 2014-01-16 – 2014-01-20 (×24): 1 via ORAL
  Filled 2014-01-16 (×24): qty 1

## 2014-01-16 NOTE — Progress Notes (Signed)
UR chart review completed.  

## 2014-01-16 NOTE — Progress Notes (Signed)
[facility]                                                                              Patient Demographics  [name], is a 48 y.o. female, DOB - 12/25/1965, KKX:[id number]  Admit date - 01/15/2014   Admitting Physician Waldemar Dickens, MD  Outpatient Primary MD for the patient is Vic Blackbird, MD  LOS - 1   Chief Complaint  Patient presents with  . Respiratory Distress      HPI on 01/15/2014 [name] is a 48 y.o. female came to the  ED on 01/15/2014 with progressive SOB and cough. Gradual worsening for weeks. Significantly worsened when she went to beach this past weekend. Improved with home breathing treatments but O2 was as low as 85 at home. Used her CPAPbut still felt SOB. Does not wear O2 during the day. Smoking 1ppd. Denied fevers, palpitations. Associated w/ CP which comes on w/ deep breathing.    Assessment & Plan   COPD exacerbation -Possibly secondary to change in weather versus chronic smoking -Chest x-ray: COPD, possible acute bronchitis -Continue oxygen supplementation to maintain saturations above 92% -Continue steroids, DuoNeb treatments, doxycycline  Chest pain -Likely pleuritic and musculoskeletal -Worsened with deep inspiration -Troponins negative x3  Diabetes mellitus, type 2 -Last hemoglobin A1c in March of 2015 was 6.7 -Continue insulin sliding scale with CBG monitoring -Home medication held  Hypertension -Continue losartan  Hyperlipidemia -Continue statin  Tobacco abuse -Advised to stop smoking and counseled. -Continue nicotine patch  GERD -Continue PPI  Chronic pain -Continue home medications of robaxin, Lyrica, oxycodone  Depression and anxiety -Continue her medications  Code Status: Full  Family Communication: None at bedside  Disposition Plan: Admitted  Time Spent in minutes   30 minutes  Procedures  None  Consults   None  DVT Prophylaxis  heparin  Lab Results  Component Value Date   PLT 265 01/15/2014    Medications  Scheduled Meds: . atorvastatin  10 mg Oral q1800  . doxycycline  100 mg Oral Q12H  . DULoxetine  60 mg Oral Daily  . heparin  5,000 Units Subcutaneous 3 times per day  . insulin aspart  0-20 Units Subcutaneous TID WC  . insulin aspart  0-5 Units Subcutaneous QHS  . losartan  50 mg Oral Daily  . methylphenidate  20 mg Oral BID WC  . methylPREDNISolone (SOLU-MEDROL) injection  60 mg Intravenous Q6H  . nicotine  21 mg Transdermal Daily  . pantoprazole  40 mg Oral Daily  . pregabalin  150 mg Oral BID  . senna  1 tablet Oral BID   Continuous Infusions: . sodium chloride Stopped (01/16/14 0346)   PRN Meds:.chlorpheniramine-HYDROcodone, ibuprofen, ipratropium-albuterol, methocarbamol, ondansetron (ZOFRAN) IV, ondansetron, oxyCODONE-acetaminophen, polyethylene glycol  Antibiotics    Anti-infectives   Start     Dose/Rate Route Frequency Ordered Stop   01/15/14 1700  doxycycline (VIBRA-TABS) tablet 100 mg     100 mg Oral Every 12 hours 01/15/14 1649          Subjective:   [name] seen and examined today.  Patient states her shortness of breath is about the same.  She states she has  been coughing quite a bit and feels a lot of chest pain. She feels that the pain medication is not helping. Patient currently denies any dizziness, headache, abdominal pain.  Objective:   Filed Vitals:   01/16/14 0239 01/16/14 0532 01/16/14 0807 01/16/14 1415  BP:  130/52  129/53  Pulse:  95  112  Temp:  98.3 F (36.8 C)  98.1 F (36.7 C)  TempSrc:  Oral  Oral  Resp:  18  20  Height:      Weight:      SpO2: 92% 93% 97% 92%    Wt Readings from Last 3 Encounters:  01/15/14 134 kg (295 lb 6.7 oz)  01/06/14 133.811 kg (295 lb)  12/09/13 130.455 kg (287 lb 9.6 oz)     Intake/Output Summary (Last 24 hours) at 01/16/14 1458 Last data filed at 01/16/14 0200  Gross per 24 hour  Intake    943 ml  Output      0 ml  Net    943 ml     Exam  General: Well developed, well nourished, NAD, appears stated age  HEENT: NCAT, PERRLA, EOMI, Anicteic Sclera, mucous membranes moist.   Cardiovascular: S1 S2 auscultated, no rubs, murmurs or gallops. Regular rate and rhythm.  Chest wall: Pain upon palpation of the chest wall  Respiratory: Diffuse wheezing  Abdomen: Soft, obese, nontender, nondistended, + bowel sounds  Extremities: warm dry without cyanosis clubbing or edema  Neuro: AAOx3, cranial nerves grossly intact. Strength 5/5 in patient's upper and lower extremities bilaterally  Data Review   Micro Results Recent Results (from the past 240 hour(s))  MRSA PCR SCREENING     Status: None   Collection Time    01/16/14 12:00 PM      Result Value Ref Range Status   MRSA by PCR NEGATIVE  NEGATIVE Final   Comment:            The GeneXpert MRSA Assay (FDA     approved for NASAL specimens     only), is one component of a     comprehensive MRSA colonization     surveillance program. It is not     intended to diagnose MRSA     infection nor to guide or     monitor treatment for     MRSA infections.    Radiology Reports Dg Chest 2 View  01/15/2014   CLINICAL DATA:  Two weeks of cough with 1 week of shortness of breath ; history of COPD, diabetes, and hypertension.  EXAM: CHEST  2 VIEW  COMPARISON:  PA and lateral chest of October 18, 2013  FINDINGS: The lungs are well-expanded. There is mild hemidiaphragm flattening. The heart is normal in size. There are coarse infrahilar lung markings bilaterally which are not entirely new. The pulmonary vascularity is not engorged. The mediastinum is normal in width. There is no pleural effusion. The bony thorax is unremarkable.  IMPRESSION: COPD. Chronically infrahilar lung markings bilaterally likely reflects scarring. One cannot exclude superimposed acute bronchitis in the appropriate clinical setting.   Electronically Signed   By: David  [place]   On: 01/15/2014 12:05     CBC  Recent Labs Lab 01/15/14 1150  WBC 9.4  HGB 10.2*  HCT 35.0*  PLT 265  MCV 70.6*  MCH 20.6*  MCHC 29.1*  RDW 19.2*  LYMPHSABS 2.3  MONOABS 0.7  EOSABS 0.1  BASOSABS 0.1    Chemistries   Recent Labs Lab 01/15/14 1150 01/16/14 0501  NA 139 139  K 4.0 4.4  CL 101 101  CO2 27 27  GLUCOSE 99 215*  BUN 6 9  CREATININE 0.67 0.71  CALCIUM 9.0 9.0  AST  --  10  ALT  --  9  ALKPHOS  --  58  BILITOT  --  0.3   ------------------------------------------------------------------------------------------------------------------ estimated creatinine clearance is 118.6 ml/min (by C-G formula based on Cr of 0.71). ------------------------------------------------------------------------------------------------------------------ No results found for this basename: HGBA1C,  in the last 72 hours ------------------------------------------------------------------------------------------------------------------ No results found for this basename: CHOL, HDL, LDLCALC, TRIG, CHOLHDL, LDLDIRECT,  in the last 72 hours ------------------------------------------------------------------------------------------------------------------ No results found for this basename: TSH, T4TOTAL, FREET3, T3FREE, THYROIDAB,  in the last 72 hours ------------------------------------------------------------------------------------------------------------------ No results found for this basename: VITAMINB12, FOLATE, FERRITIN, TIBC, IRON, RETICCTPCT,  in the last 72 hours  Coagulation profile No results found for this basename: INR, PROTIME,  in the last 168 hours  No results found for this basename: DDIMER,  in the last 72 hours  Cardiac Enzymes  Recent Labs Lab 01/15/14 1150 01/15/14 2230 01/16/14 0501  TROPONINI <0.30 <0.30 <0.30   ------------------------------------------------------------------------------------------------------------------ No components found with this basename: POCBNP,      Kayode Petion D.O. on 01/16/2014 at 2:58 PM  Between 7am to 7pm - Pager - [phone]  After 7pm go to [email or web address] - password [email or web address]  And look for the night coverage person covering for me after hours  [facility] Office  [phone]

## 2014-01-16 NOTE — Progress Notes (Signed)
Inpatient Diabetes Program Recommendations  AACE/ADA: New Consensus Statement on Inpatient Glycemic Control (2013)  Target Ranges:  Prepandial:   less than 140 mg/dL      Peak postprandial:   less than 180 mg/dL (1-2 hours)      Critically ill patients:  140 - 180 mg/dL   Results for CICI, RODRIGES (MRN [id number]) as of 01/16/2014 12:57  Ref. Range 01/20/2013 08:09 01/20/2013 11:32 01/15/2014 18:10 01/15/2014 21:07 01/16/2014 07:35 01/16/2014 11:59  Glucose-Capillary Latest Range: 70-99 mg/dL 212 (H) 312 (H) 292 (H) 285 (H) 178 (H) 247 (H)   Diabetes history: DM2 Outpatient Diabetes medications: Tradjenta 5 mg daily Current orders for Inpatient glycemic control: Novolog 0-20 units AC, Novolog 0-5 units HS  Inpatient Diabetes Program Recommendations Insulin - Basal: If steroids are continued, please consider ordering low dose basal insulin. Recommend starting with Levemir 10 units QHS. Insulin - Meal Coverage: If steroids are continued, please consider ordering Novolog 4 units TID with meals for meal coverage (in addition to Novolog correction scale).  Thanks, Barnie Alderman, RN, MSN, CCRN Diabetes Coordinator Inpatient Diabetes Program [phone] (Team Pager) [phone] ([facility] office) [phone] [facility] office)

## 2014-01-16 NOTE — Progress Notes (Signed)
Patient refuses IV fluids

## 2014-01-16 NOTE — Care Management Note (Addendum)
    Page 1 of 1   01/20/2014     11:10:04 AM CARE MANAGEMENT NOTE 01/20/2014  Patient:  [name]   Account Number:  [id number]  Date Initiated:  01/16/2014  Documentation initiated by:  Theophilus Kinds  Subjective/Objective Assessment:   Pt admitted from home with COPD. Pt lives with her sister and will return home at discharge.  Pt is independent with ADL's. Pt has home O2 with [facility], cpap, and neb machine.     Action/Plan:   No CM needs noted.   Anticipated DC Date:  01/19/2014   Anticipated DC Plan:  [place]  CM consult      Choice offered to / List presented to:             Status of service:  Completed, signed off Medicare Important Message given?  YES (If response is "NO", the following Medicare IM given date fields will be blank) Date Medicare IM given:  01/16/2014 Medicare IM given by:  Christinia Gully C Date Additional Medicare IM given:  01/20/2014 Additional Medicare IM given by:  Theophilus Kinds  Discharge Disposition:  HOME/SELF CARE  Per UR Regulation:    If discussed at Long Length of Stay Meetings, dates discussed:   01/20/2014    Comments:  01/20/14 [place], RN BSN CM Pt discharged home today. No CM needs noted.  01/16/14 [place], RN BSN CM

## 2014-01-17 DIAGNOSIS — D72829 Elevated white blood cell count, unspecified: Secondary | ICD-10-CM

## 2014-01-17 LAB — CBC
HCT: 33.9 % — ABNORMAL LOW (ref 36.0–46.0)
Hemoglobin: 9.7 g/dL — ABNORMAL LOW (ref 12.0–15.0)
MCH: 20.5 pg — ABNORMAL LOW (ref 26.0–34.0)
MCHC: 28.6 g/dL — ABNORMAL LOW (ref 30.0–36.0)
MCV: 71.5 fL — ABNORMAL LOW (ref 78.0–100.0)
Platelets: 301 10*3/uL (ref 150–400)
RBC: 4.74 MIL/uL (ref 3.87–5.11)
RDW: 19.4 % — ABNORMAL HIGH (ref 11.5–15.5)
WBC: 22.9 10*3/uL — ABNORMAL HIGH (ref 4.0–10.5)

## 2014-01-17 LAB — BASIC METABOLIC PANEL
Anion gap: 15 (ref 5–15)
BUN: 15 mg/dL (ref 6–23)
CO2: 25 mEq/L (ref 19–32)
Calcium: 9.6 mg/dL (ref 8.4–10.5)
Chloride: 100 mEq/L (ref 96–112)
Creatinine, Ser: 0.69 mg/dL (ref 0.50–1.10)
GFR calc Af Amer: >90 mL/min (ref >90)
GFR calc non Af Amer: >90 mL/min (ref >90)
Glucose, Bld: 221 mg/dL — ABNORMAL HIGH (ref 70–99)
Potassium: 4.3 mEq/L (ref 3.7–5.3)
Sodium: 140 mEq/L (ref 137–147)

## 2014-01-17 LAB — GLUCOSE, CAPILLARY
Glucose-Capillary: 201 mg/dL — ABNORMAL HIGH (ref 70–99)
Glucose-Capillary: 210 mg/dL — ABNORMAL HIGH (ref 70–99)
Glucose-Capillary: 240 mg/dL — ABNORMAL HIGH (ref 70–99)
Glucose-Capillary: 214 mg/dL — ABNORMAL HIGH (ref 70–99)

## 2014-01-17 MED ORDER — IPRATROPIUM-ALBUTEROL 0.5-2.5 (3) MG/3ML IN SOLN
3.0000 mL | Freq: Four times a day (QID) | RESPIRATORY_TRACT | Status: DC
  Administered 2014-01-18 – 2014-01-20 (×10): 3 mL via RESPIRATORY_TRACT
  Filled 2014-01-17 (×11): qty 3

## 2014-01-17 MED ORDER — METHYLPREDNISOLONE SODIUM SUCC 125 MG IJ SOLR
60.0000 mg | Freq: Two times a day (BID) | INTRAMUSCULAR | Status: DC
  Administered 2014-01-17 – 2014-01-20 (×6): 60 mg via INTRAVENOUS
  Filled 2014-01-17 (×6): qty 2

## 2014-01-17 NOTE — Progress Notes (Addendum)
[facility]                                                                              Patient Demographics  [name], is a 48 y.o. female, DOB - 12-18-1965, GEX:[id number]  Admit date - 01/15/2014   Admitting Physician Waldemar Dickens, MD  Outpatient Primary MD for the patient is Vic Blackbird, MD  LOS - 2   Chief Complaint  Patient presents with  . Respiratory Distress      HPI on 01/15/2014 [name] is a 48 y.o. female came to the  ED on 01/15/2014 with progressive SOB and cough. Gradual worsening for weeks. Significantly worsened when she went to beach this past weekend. Improved with home breathing treatments but O2 was as low as 85 at home. Used her CPAPbut still felt SOB. Does not wear O2 during the day. Smoking 1ppd. Denied fevers, palpitations. Associated w/ CP which comes on w/ deep breathing.    Assessment & Plan   COPD exacerbation -Improving, Possibly secondary to change in weather versus chronic smoking -Chest x-ray: COPD, possible acute bronchitis -Continue oxygen supplementation to maintain saturations above 92% -Continue steroids, DuoNeb treatments, doxycycline, and tussionex -Will begin to wean steroids today  Leukocytosis -Likely reactive secondary to steroids -Will continue monitor CBC  Chest pain -Resolving, Likely pleuritic and musculoskeletal -Worsened with deep inspiration -Troponins negative x3  Diabetes mellitus, type 2 -Last hemoglobin A1c in March of 2015 was 6.7 -Continue insulin sliding scale with CBG monitoring -Home medication held  Hypertension -Continue losartan  Hyperlipidemia -Continue statin  Tobacco abuse -Advised to stop smoking and counseled. -Continue nicotine patch  GERD -Continue PPI  Chronic pain -Continue home medications of robaxin, Lyrica, oxycodone  Depression and anxiety -Continue her medications  Normocytic anemia -Hb remains stable, will continue to monitor CBC  Code  Status: Full  Family Communication: None at bedside  Disposition Plan: Admitted, will likely discharge on 9/27  Time Spent in minutes   30 minutes  Procedures  None  Consults   None  DVT Prophylaxis  heparin  Lab Results  Component Value Date   PLT 301 01/17/2014    Medications  Scheduled Meds: . atorvastatin  10 mg Oral q1800  . doxycycline  100 mg Oral Q12H  . DULoxetine  60 mg Oral Daily  . heparin  5,000 Units Subcutaneous 3 times per day  . insulin aspart  0-20 Units Subcutaneous TID WC  . insulin aspart  0-5 Units Subcutaneous QHS  . losartan  50 mg Oral Daily  . methylphenidate  20 mg Oral BID WC  . methylPREDNISolone (SOLU-MEDROL) injection  60 mg Intravenous Q6H  . nicotine  21 mg Transdermal Daily  . pantoprazole  40 mg Oral Daily  . pregabalin  150 mg Oral BID  . senna  1 tablet Oral BID   Continuous Infusions: . sodium chloride Stopped (01/16/14 0346)   PRN Meds:.chlorpheniramine-HYDROcodone, ibuprofen, ipratropium-albuterol, methocarbamol, ondansetron (ZOFRAN) IV, ondansetron, oxyCODONE-acetaminophen, polyethylene glycol  Antibiotics    Anti-infectives   Start     Dose/Rate Route Frequency Ordered Stop   01/15/14 1700  doxycycline (VIBRA-TABS) tablet 100 mg     100 mg Oral Every  12 hours 01/15/14 1649          Subjective:   [name] seen and examined today.  Patient states her shortness of breath has improved.  She feels her cough has also improved, hwoever continues to have dry cough.  She states she was able to wear her CPAP overnight. Patient currently denies any dizziness, headache, abdominal pain.  Objective:   Filed Vitals:   01/16/14 2336 01/16/14 2341 01/17/14 0330 01/17/14 0646  BP:    147/68  Pulse:    86  Temp:    97.8 F (36.6 C)  TempSrc:    Oral  Resp:    20  Height:      Weight:      SpO2: 95% 100% 97% 97%    Wt Readings from Last 3 Encounters:  01/15/14 134 kg (295 lb 6.7 oz)  01/06/14 133.811 kg (295 lb)    12/09/13 130.455 kg (287 lb 9.6 oz)     Intake/Output Summary (Last 24 hours) at 01/17/14 1044 Last data filed at 01/16/14 2100  Gross per 24 hour  Intake    480 ml  Output   1000 ml  Net   -520 ml    Exam  General: Well developed, well nourished, NAD, appears stated age  HEENT: NCAT, mucous membranes moist. Circular scar noted on right temple.  Cardiovascular: S1 S2 auscultated, no rubs, murmurs or gallops. Regular rate and rhythm.  Respiratory: Diffuse expiratory wheezing, however, improved.    Abdomen: Soft, obese, nontender, nondistended, + bowel sounds  Extremities: warm dry without cyanosis clubbing or edema  Neuro: AAOx3, no focal deficits  Data Review   Micro Results Recent Results (from the past 240 hour(s))  MRSA PCR SCREENING     Status: None   Collection Time    01/16/14 12:00 PM      Result Value Ref Range Status   MRSA by PCR NEGATIVE  NEGATIVE Final   Comment:            The GeneXpert MRSA Assay (FDA     approved for NASAL specimens     only), is one component of a     comprehensive MRSA colonization     surveillance program. It is not     intended to diagnose MRSA     infection nor to guide or     monitor treatment for     MRSA infections.    Radiology Reports Dg Chest 2 View  01/15/2014   CLINICAL DATA:  Two weeks of cough with 1 week of shortness of breath ; history of COPD, diabetes, and hypertension.  EXAM: CHEST  2 VIEW  COMPARISON:  PA and lateral chest of October 18, 2013  FINDINGS: The lungs are well-expanded. There is mild hemidiaphragm flattening. The heart is normal in size. There are coarse infrahilar lung markings bilaterally which are not entirely new. The pulmonary vascularity is not engorged. The mediastinum is normal in width. There is no pleural effusion. The bony thorax is unremarkable.  IMPRESSION: COPD. Chronically infrahilar lung markings bilaterally likely reflects scarring. One cannot exclude superimposed acute bronchitis in  the appropriate clinical setting.   Electronically Signed   By: David  Martinique   On: 01/15/2014 12:05    CBC  Recent Labs Lab 01/15/14 1150 01/17/14 0622  WBC 9.4 22.9*  HGB 10.2* 9.7*  HCT 35.0* 33.9*  PLT 265 301  MCV 70.6* 71.5*  MCH 20.6* 20.5*  MCHC 29.1* 28.6*  RDW 19.2* 19.4*  LYMPHSABS 2.3  --   MONOABS 0.7  --   EOSABS 0.1  --   BASOSABS 0.1  --     Chemistries   Recent Labs Lab 01/15/14 1150 01/16/14 0501 01/17/14 0622  NA 139 139 140  K 4.0 4.4 4.3  CL 101 101 100  CO2 27 27 25   GLUCOSE 99 215* 221*  BUN 6 9 15   CREATININE 0.67 0.71 0.69  CALCIUM 9.0 9.0 9.6  AST  --  10  --   ALT  --  9  --   ALKPHOS  --  58  --   BILITOT  --  0.3  --    ------------------------------------------------------------------------------------------------------------------ estimated creatinine clearance is 118.6 ml/min (by C-G formula based on Cr of 0.69). ------------------------------------------------------------------------------------------------------------------  Recent Labs  01/16/14 0501  HGBA1C 6.9*   ------------------------------------------------------------------------------------------------------------------ No results found for this basename: CHOL, HDL, LDLCALC, TRIG, CHOLHDL, LDLDIRECT,  in the last 72 hours ------------------------------------------------------------------------------------------------------------------ No results found for this basename: TSH, T4TOTAL, FREET3, T3FREE, THYROIDAB,  in the last 72 hours ------------------------------------------------------------------------------------------------------------------ No results found for this basename: VITAMINB12, FOLATE, FERRITIN, TIBC, IRON, RETICCTPCT,  in the last 72 hours  Coagulation profile No results found for this basename: INR, PROTIME,  in the last 168 hours  No results found for this basename: DDIMER,  in the last 72 hours  Cardiac Enzymes  Recent Labs Lab  01/15/14 1150 01/15/14 2230 01/16/14 0501  TROPONINI <0.30 <0.30 <0.30   ------------------------------------------------------------------------------------------------------------------ No components found with this basename: POCBNP,     [name] D.O. on 01/17/2014 at 10:44 AM  Between 7am to 7pm - Pager - [phone]  After 7pm go to [email or web address] - password [email or web address]  And look for the night coverage person covering for me after hours  [facility] Office  [phone]

## 2014-01-18 DIAGNOSIS — D509 Iron deficiency anemia, unspecified: Secondary | ICD-10-CM

## 2014-01-18 LAB — GLUCOSE, CAPILLARY
Glucose-Capillary: 278 mg/dL — ABNORMAL HIGH (ref 70–99)
Glucose-Capillary: 230 mg/dL — ABNORMAL HIGH (ref 70–99)
Glucose-Capillary: 265 mg/dL — ABNORMAL HIGH (ref 70–99)
Glucose-Capillary: 270 mg/dL — ABNORMAL HIGH (ref 70–99)

## 2014-01-18 MED ORDER — GUAIFENESIN-CODEINE 100-10 MG/5ML PO SOLN
10.0000 mL | ORAL | Status: DC | PRN
  Administered 2014-01-18 – 2014-01-20 (×12): 10 mL via ORAL
  Filled 2014-01-18 (×12): qty 10

## 2014-01-18 MED ORDER — MOMETASONE FURO-FORMOTEROL FUM 100-5 MCG/ACT IN AERO
2.0000 | INHALATION_SPRAY | Freq: Two times a day (BID) | RESPIRATORY_TRACT | Status: DC
  Administered 2014-01-18 – 2014-01-20 (×5): 2 via RESPIRATORY_TRACT
  Filled 2014-01-18: qty 8.8

## 2014-01-18 NOTE — Progress Notes (Signed)
[facility]                                                                              Patient Demographics  [name], is a 48 y.o. female, DOB - [birth year], FTD:[id number]  Admit date - 01/15/2014   Admitting Physician Waldemar Dickens, MD  Outpatient Primary MD for the patient is Vic Blackbird, MD  LOS - 3   Chief Complaint  Patient presents with  . Respiratory Distress      HPI on 01/15/2014 [name] is a 48 y.o. female came to the  ED on 01/15/2014 with progressive SOB and cough. Gradual worsening for weeks. Significantly worsened when she went to beach this past weekend. Improved with home breathing treatments but O2 was as low as 85 at home. Used her CPAPbut still felt SOB. Does not wear O2 during the day. Smoking 1ppd. Denied fevers, palpitations. Associated w/ CP which comes on w/ deep breathing.    Assessment & Plan   COPD exacerbation -Patient continues to have cough with diminished breath sounds. -Possibly secondary to change in weather versus chronic smoking -Chest x-ray: COPD, possible acute bronchitis -Continue oxygen supplementation to maintain saturations above 92% -Continue steroids, DuoNeb treatments, doxycycline, and guaifenesin with codeine  Leukocytosis -Likely reactive secondary to steroids -Will continue monitor CBC  Chest pain -Resolving, Likely pleuritic and musculoskeletal -Worsened with deep inspiration -Troponins negative x3  Diabetes mellitus, type 2 -Last hemoglobin A1c in March of 2015 was 6.7 -Continue insulin sliding scale with CBG monitoring -Home medication held  Hypertension -Continue losartan  Hyperlipidemia -Continue statin  Tobacco abuse -Advised to stop smoking and counseled. -Continue nicotine patch  GERD -Continue PPI  Chronic pain -Continue home medications of robaxin, Lyrica, oxycodone  Depression and anxiety -Continue her medications  Normocytic anemia -Hb remains stable, will  continue to monitor CBC  Code Status: Full  Family Communication: None at bedside  Disposition Plan: Admitted, will likely discharge on 9/27  Time Spent in minutes   30 minutes  Procedures  None  Consults   None  DVT Prophylaxis  heparin  Lab Results  Component Value Date   PLT 301 01/17/2014    Medications  Scheduled Meds: . atorvastatin  10 mg Oral q1800  . doxycycline  100 mg Oral Q12H  . DULoxetine  60 mg Oral Daily  . heparin  5,000 Units Subcutaneous 3 times per day  . insulin aspart  0-20 Units Subcutaneous TID WC  . insulin aspart  0-5 Units Subcutaneous QHS  . ipratropium-albuterol  3 mL Nebulization QID  . losartan  50 mg Oral Daily  . methylphenidate  20 mg Oral BID WC  . methylPREDNISolone (SOLU-MEDROL) injection  60 mg Intravenous Q12H  . nicotine  21 mg Transdermal Daily  . pantoprazole  40 mg Oral Daily  . pregabalin  150 mg Oral BID  . senna  1 tablet Oral BID   Continuous Infusions: . sodium chloride Stopped (01/16/14 0346)   PRN Meds:.guaiFENesin-codeine, ibuprofen, ipratropium-albuterol, methocarbamol, ondansetron (ZOFRAN) IV, ondansetron, oxyCODONE-acetaminophen, polyethylene glycol  Antibiotics    Anti-infectives   Start     Dose/Rate Route Frequency Ordered Stop   01/15/14 1700  doxycycline (  VIBRA-TABS) tablet 100 mg     100 mg Oral Every 12 hours 01/15/14 1649          Subjective:   [name] seen and examined today.  Patient states her shortness of breath has improved but continues to cough.  Patient currently denies any dizziness, headache, abdominal pain.  Objective:   Filed Vitals:   01/17/14 2354 01/18/14 0436 01/18/14 0439 01/18/14 0721  BP:  133/68    Pulse: 73     Temp:  97.4 F (36.3 C)    TempSrc:  Oral    Resp: 18 20    Height:      Weight:      SpO2: 99% 99% 96% 91%    Wt Readings from Last 3 Encounters:  01/15/14 134 kg (295 lb 6.7 oz)  01/06/14 133.811 kg (295 lb)  12/09/13 130.455 kg (287 lb 9.6  oz)     Intake/Output Summary (Last 24 hours) at 01/18/14 1114 Last data filed at 01/18/14 0800  Gross per 24 hour  Intake    960 ml  Output      0 ml  Net    960 ml    Exam  General: Well developed, well nourished, NAD, appears stated age  HEENT: NCAT, mucous membranes moist. Circular scar noted on right temple.  Cardiovascular: S1 S2 auscultated, no rubs, murmurs or gallops. Regular rate and rhythm.  Respiratory: Diffuse expiratory wheezing, diminished breat sounds and decreased air movement  Abdomen: Soft, obese, nontender, nondistended, + bowel sounds  Extremities: warm dry without cyanosis clubbing or edema  Neuro: AAOx3, no focal deficits  Data Review   Micro Results Recent Results (from the past 240 hour(s))  MRSA PCR SCREENING     Status: None   Collection Time    01/16/14 12:00 PM      Result Value Ref Range Status   MRSA by PCR NEGATIVE  NEGATIVE Final   Comment:            The GeneXpert MRSA Assay (FDA     approved for NASAL specimens     only), is one component of a     comprehensive MRSA colonization     surveillance program. It is not     intended to diagnose MRSA     infection nor to guide or     monitor treatment for     MRSA infections.    Radiology Reports Dg Chest 2 View  01/15/2014   CLINICAL DATA:  Two weeks of cough with 1 week of shortness of breath ; history of COPD, diabetes, and hypertension.  EXAM: CHEST  2 VIEW  COMPARISON:  PA and lateral chest of October 18, 2013  FINDINGS: The lungs are well-expanded. There is mild hemidiaphragm flattening. The heart is normal in size. There are coarse infrahilar lung markings bilaterally which are not entirely new. The pulmonary vascularity is not engorged. The mediastinum is normal in width. There is no pleural effusion. The bony thorax is unremarkable.  IMPRESSION: COPD. Chronically infrahilar lung markings bilaterally likely reflects scarring. One cannot exclude superimposed acute bronchitis in the  appropriate clinical setting.   Electronically Signed   By: David  Martinique   On: 01/15/2014 12:05    CBC  Recent Labs Lab 01/15/14 1150 01/17/14 0622  WBC 9.4 22.9*  HGB 10.2* 9.7*  HCT 35.0* 33.9*  PLT 265 301  MCV 70.6* 71.5*  MCH 20.6* 20.5*  MCHC 29.1* 28.6*  RDW 19.2* 19.4*  LYMPHSABS 2.3  --  MONOABS 0.7  --   EOSABS 0.1  --   BASOSABS 0.1  --     Chemistries   Recent Labs Lab 01/15/14 1150 01/16/14 0501 01/17/14 0622  NA 139 139 140  K 4.0 4.4 4.3  CL 101 101 100  CO2 27 27 25   GLUCOSE 99 215* 221*  BUN 6 9 15   CREATININE 0.67 0.71 0.69  CALCIUM 9.0 9.0 9.6  AST  --  10  --   ALT  --  9  --   ALKPHOS  --  58  --   BILITOT  --  0.3  --    ------------------------------------------------------------------------------------------------------------------ estimated creatinine clearance is 118.6 ml/min (by C-G formula based on Cr of 0.69). ------------------------------------------------------------------------------------------------------------------  Recent Labs  01/16/14 0501  HGBA1C 6.9*   ------------------------------------------------------------------------------------------------------------------ No results found for this basename: CHOL, HDL, LDLCALC, TRIG, CHOLHDL, LDLDIRECT,  in the last 72 hours ------------------------------------------------------------------------------------------------------------------ No results found for this basename: TSH, T4TOTAL, FREET3, T3FREE, THYROIDAB,  in the last 72 hours ------------------------------------------------------------------------------------------------------------------ No results found for this basename: VITAMINB12, FOLATE, FERRITIN, TIBC, IRON, RETICCTPCT,  in the last 72 hours  Coagulation profile No results found for this basename: INR, PROTIME,  in the last 168 hours  No results found for this basename: DDIMER,  in the last 72 hours  Cardiac Enzymes  Recent Labs Lab 01/15/14 1150  01/15/14 2230 01/16/14 0501  TROPONINI <0.30 <0.30 <0.30   ------------------------------------------------------------------------------------------------------------------ No components found with this basename: POCBNP,     Mako Pelfrey D.O. on 01/18/2014 at 11:14 AM  Between 7am to 7pm - Pager - [phone]  After 7pm go to [email or web address] - password [email or web address]  And look for the night coverage person covering for me after hours  [facility] Office  [phone]

## 2014-01-19 ENCOUNTER — Inpatient Hospital Stay (HOSPITAL_COMMUNITY): Payer: Medicare HMO

## 2014-01-19 LAB — GLUCOSE, CAPILLARY
Glucose-Capillary: 277 mg/dL — ABNORMAL HIGH (ref 70–99)
Glucose-Capillary: 185 mg/dL — ABNORMAL HIGH (ref 70–99)
Glucose-Capillary: 218 mg/dL — ABNORMAL HIGH (ref 70–99)

## 2014-01-19 LAB — BASIC METABOLIC PANEL
Anion gap: 11 (ref 5–15)
BUN: 17 mg/dL (ref 6–23)
CO2: 31 mEq/L (ref 19–32)
Calcium: 8.9 mg/dL (ref 8.4–10.5)
Chloride: 98 mEq/L (ref 96–112)
Creatinine, Ser: 0.75 mg/dL (ref 0.50–1.10)
GFR calc Af Amer: >90 mL/min (ref >90)
GFR calc non Af Amer: >90 mL/min (ref >90)
Glucose, Bld: 267 mg/dL — ABNORMAL HIGH (ref 70–99)
Potassium: 4.9 mEq/L (ref 3.7–5.3)
Sodium: 140 mEq/L (ref 137–147)

## 2014-01-19 LAB — CBC
HCT: 32.8 % — ABNORMAL LOW (ref 36.0–46.0)
Hemoglobin: 9.5 g/dL — ABNORMAL LOW (ref 12.0–15.0)
MCH: 20.5 pg — ABNORMAL LOW (ref 26.0–34.0)
MCHC: 29 g/dL — ABNORMAL LOW (ref 30.0–36.0)
MCV: 70.8 fL — ABNORMAL LOW (ref 78.0–100.0)
Platelets: 251 10*3/uL (ref 150–400)
RBC: 4.63 MIL/uL (ref 3.87–5.11)
RDW: 19 % — ABNORMAL HIGH (ref 11.5–15.5)
WBC: 12.4 10*3/uL — ABNORMAL HIGH (ref 4.0–10.5)

## 2014-01-19 MED ORDER — LEVOFLOXACIN IN D5W 750 MG/150ML IV SOLN
750.0000 mg | INTRAVENOUS | Status: DC
  Administered 2014-01-19 – 2014-01-20 (×2): 750 mg via INTRAVENOUS
  Filled 2014-01-19 (×2): qty 150

## 2014-01-19 MED ORDER — SODIUM CHLORIDE 3 % IN NEBU
5.0000 mL | INHALATION_SOLUTION | Freq: Every day | RESPIRATORY_TRACT | Status: DC
  Administered 2014-01-19 – 2014-01-20 (×2): 5 mL via RESPIRATORY_TRACT
  Filled 2014-01-19 (×3): qty 8

## 2014-01-19 NOTE — Progress Notes (Signed)
[facility]                                                                              Patient Demographics  [name], is a 48 y.o. female, DOB - 26-Oct-1965, FBP:[id number]  Admit date - 01/15/2014   Admitting Physician Waldemar Dickens, MD  Outpatient Primary MD for the patient is Vic Blackbird, MD  LOS - 4   Chief Complaint  Patient presents with  . Respiratory Distress      HPI on 01/15/2014 [name] is a 48 y.o. female came to the  ED on 01/15/2014 with progressive SOB and cough. Gradual worsening for weeks. Significantly worsened when she went to beach this past weekend. Improved with home breathing treatments but O2 was as low as 85 at home. Used her CPAPbut still felt SOB. Does not wear O2 during the day. Smoking 1ppd. Denied fevers, palpitations. Associated w/ CP which comes on w/ deep breathing.    Assessment & Plan   COPD exacerbation -Patient continues to have cough with diminished breath sounds. -Possibly secondary to change in weather versus chronic smoking -Chest x-ray: COPD, possible acute bronchitis -Continue oxygen supplementation to maintain saturations above 92% -Continue steroids, DuoNeb treatments, and guaifenesin with codeine -Will change antibiotics to Levaquin -Will order flutter valve and 3% saline with nebs -Will order repeat CXR  Leukocytosis -Resolving, Likely reactive secondary to steroids -Will continue monitor CBC  Chest pain -Resolving, Likely pleuritic and musculoskeletal -Worsened with deep inspiration -Troponins negative x3  Diabetes mellitus, type 2 -Last hemoglobin A1c in March of 2015 was 6.7 -Continue insulin sliding scale with CBG monitoring -Home medication held  Hypertension -Continue losartan  Hyperlipidemia -Continue statin  Tobacco abuse -Advised to stop smoking and counseled. -Continue nicotine patch  GERD -Continue PPI  Chronic pain -Continue home medications of robaxin, Lyrica,  oxycodone  Depression and anxiety -Continue her medications  Normocytic anemia -Hb remains stable, will continue to monitor CBC  Code Status: Full  Family Communication: Family at bedside  Disposition Plan: Admitted, will likely discharge on 9/29  Time Spent in minutes   30 minutes  Procedures  None  Consults   None  DVT Prophylaxis  heparin  Lab Results  Component Value Date   PLT 251 01/19/2014    Medications  Scheduled Meds: . atorvastatin  10 mg Oral q1800  . doxycycline  100 mg Oral Q12H  . heparin  5,000 Units Subcutaneous 3 times per day  . insulin aspart  0-20 Units Subcutaneous TID WC  . insulin aspart  0-5 Units Subcutaneous QHS  . ipratropium-albuterol  3 mL Nebulization QID  . levofloxacin (LEVAQUIN) IV  750 mg Intravenous Q24H  . losartan  50 mg Oral Daily  . methylphenidate  20 mg Oral BID WC  . methylPREDNISolone (SOLU-MEDROL) injection  60 mg Intravenous Q12H  . mometasone-formoterol  2 puff Inhalation BID  . nicotine  21 mg Transdermal Daily  . pantoprazole  40 mg Oral Daily  . pregabalin  150 mg Oral BID  . senna  1 tablet Oral BID  . sodium chloride HYPERTONIC  5 mL Nebulization Daily   Continuous Infusions: . sodium chloride Stopped (01/16/14 0346)  PRN Meds:.guaiFENesin-codeine, ibuprofen, ipratropium-albuterol, methocarbamol, ondansetron (ZOFRAN) IV, ondansetron, oxyCODONE-acetaminophen, polyethylene glycol  Antibiotics    Anti-infectives   Start     Dose/Rate Route Frequency Ordered Stop   01/19/14 1100  levofloxacin (LEVAQUIN) IVPB 750 mg     750 mg 100 mL/hr over 90 Minutes Intravenous Every 24 hours 01/19/14 1047     01/15/14 1700  doxycycline (VIBRA-TABS) tablet 100 mg     100 mg Oral Every 12 hours 01/15/14 1649          Subjective:   Aspin [name] seen and examined today.  Patient continues to feel short of breath and have coughing spells. She states that she feels like something is stuck in her chest. . Patient  denies any chest pain at this time, abdominal pain, dizziness, headache.  Objective:   Filed Vitals:   01/19/14 0321 01/19/14 0605 01/19/14 0835 01/19/14 1125  BP:  119/59    Pulse:  86    Temp:  98.3 F (36.8 C)    TempSrc:  Oral    Resp:  20    Height:      Weight:      SpO2: 86% 97% 96% 95%    Wt Readings from Last 3 Encounters:  01/15/14 134 kg (295 lb 6.7 oz)  01/06/14 133.811 kg (295 lb)  12/09/13 130.455 kg (287 lb 9.6 oz)     Intake/Output Summary (Last 24 hours) at 01/19/14 1307 Last data filed at 01/18/14 2000  Gross per 24 hour  Intake    480 ml  Output      0 ml  Net    480 ml    Exam  General: Well developed, well nourished, NAD, appears stated age  HEENT: NCAT, mucous membranes moist. Circular scar noted on right temple.  Cardiovascular: S1 S2 auscultated, no rubs, murmurs or gallops. Regular rate and rhythm.  Respiratory: Diffuse expiratory wheezing- improving slighly, diminished breat sounds and decreased air movement  Abdomen: Soft, obese, nontender, nondistended, + bowel sounds  Extremities: warm dry without cyanosis clubbing or edema  Neuro: AAOx3, no focal deficits  Data Review   Micro Results Recent Results (from the past 240 hour(s))  MRSA PCR SCREENING     Status: None   Collection Time    01/16/14 12:00 PM      Result Value Ref Range Status   MRSA by PCR NEGATIVE  NEGATIVE Final   Comment:            The GeneXpert MRSA Assay (FDA     approved for NASAL specimens     only), is one component of a     comprehensive MRSA colonization     surveillance program. It is not     intended to diagnose MRSA     infection nor to guide or     monitor treatment for     MRSA infections.    Radiology Reports Dg Chest 2 View  01/15/2014   CLINICAL DATA:  Two weeks of cough with 1 week of shortness of breath ; history of COPD, diabetes, and hypertension.  EXAM: CHEST  2 VIEW  COMPARISON:  PA and lateral chest of October 18, 2013  FINDINGS: The  lungs are well-expanded. There is mild hemidiaphragm flattening. The heart is normal in size. There are coarse infrahilar lung markings bilaterally which are not entirely new. The pulmonary vascularity is not engorged. The mediastinum is normal in width. There is no pleural effusion. The bony thorax is unremarkable.  IMPRESSION:  COPD. Chronically infrahilar lung markings bilaterally likely reflects scarring. One cannot exclude superimposed acute bronchitis in the appropriate clinical setting.   Electronically Signed   By: David  Martinique   On: 01/15/2014 12:05    CBC  Recent Labs Lab 01/15/14 1150 01/17/14 0622 01/19/14 0556  WBC 9.4 22.9* 12.4*  HGB 10.2* 9.7* 9.5*  HCT 35.0* 33.9* 32.8*  PLT 265 301 251  MCV 70.6* 71.5* 70.8*  MCH 20.6* 20.5* 20.5*  MCHC 29.1* 28.6* 29.0*  RDW 19.2* 19.4* 19.0*  LYMPHSABS 2.3  --   --   MONOABS 0.7  --   --   EOSABS 0.1  --   --   BASOSABS 0.1  --   --     Chemistries   Recent Labs Lab 01/15/14 1150 01/16/14 0501 01/17/14 0622 01/19/14 0556  NA 139 139 140 140  K 4.0 4.4 4.3 4.9  CL 101 101 100 98  CO2 27 27 25 31   GLUCOSE 99 215* 221* 267*  BUN 6 9 15 17   CREATININE 0.67 0.71 0.69 0.75  CALCIUM 9.0 9.0 9.6 8.9  AST  --  10  --   --   ALT  --  9  --   --   ALKPHOS  --  58  --   --   BILITOT  --  0.3  --   --    ------------------------------------------------------------------------------------------------------------------ estimated creatinine clearance is 118.6 ml/min (by C-G formula based on Cr of 0.75). ------------------------------------------------------------------------------------------------------------------ No results found for this basename: HGBA1C,  in the last 72 hours ------------------------------------------------------------------------------------------------------------------ No results found for this basename: CHOL, HDL, LDLCALC, TRIG, CHOLHDL, LDLDIRECT,  in the last 72  hours ------------------------------------------------------------------------------------------------------------------ No results found for this basename: TSH, T4TOTAL, FREET3, T3FREE, THYROIDAB,  in the last 72 hours ------------------------------------------------------------------------------------------------------------------ No results found for this basename: VITAMINB12, FOLATE, FERRITIN, TIBC, IRON, RETICCTPCT,  in the last 72 hours  Coagulation profile No results found for this basename: INR, PROTIME,  in the last 168 hours  No results found for this basename: DDIMER,  in the last 72 hours  Cardiac Enzymes  Recent Labs Lab 01/15/14 1150 01/15/14 2230 01/16/14 0501  TROPONINI <0.30 <0.30 <0.30   ------------------------------------------------------------------------------------------------------------------ No components found with this basename: POCBNP,     Aimar Shrewsbury D.O. on 01/19/2014 at 1:07 PM  Between 7am to 7pm - Pager - [phone]  After 7pm go to [email or web address] - password [email or web address]  And look for the night coverage person covering for me after hours  [facility] Office  [phone]

## 2014-01-19 NOTE — Progress Notes (Signed)
ANTIBIOTIC CONSULT NOTE  Pharmacy Consult for Levaquin Indication: COPD  Allergies  Allergen Reactions  . Codeine Itching and Nausea Only  . Wellbutrin [Bupropion Hcl] Hives    Patient Measurements: Height: 5\' 4"  (162.6 cm) Weight: 295 lb 6.7 oz (134 kg) IBW/kg (Calculated) : 54.7  Vital Signs: Temp: 98.3 F (36.8 C) (09/28 0605) Temp src: Oral (09/28 0605) BP: 119/59 mmHg (09/28 0605) Pulse Rate: 86 (09/28 0605) Intake/Output from previous day: 09/27 0701 - 09/28 0700 In: 1200 [P.O.:1200] Out: -  Intake/Output from this shift:    Labs:  Recent Labs  01/17/14 0622 01/19/14 0556  WBC 22.9* 12.4*  HGB 9.7* 9.5*  PLT 301 251  CREATININE 0.69 0.75   Estimated Creatinine Clearance: 118.6 ml/min (by C-G formula based on Cr of 0.75).  Microbiology: Recent Results (from the past 720 hour(s))  MRSA PCR SCREENING     Status: None   Collection Time    01/16/14 12:00 PM      Result Value Ref Range Status   MRSA by PCR NEGATIVE  NEGATIVE Final   Comment:            The GeneXpert MRSA Assay (FDA     approved for NASAL specimens     only), is one component of a     comprehensive MRSA colonization     surveillance program. It is not     intended to diagnose MRSA     infection nor to guide or     monitor treatment for     MRSA infections.    Anti-infectives   Start     Dose/Rate Route Frequency Ordered Stop   01/19/14 1100  levofloxacin (LEVAQUIN) IVPB 750 mg     750 mg 100 mL/hr over 90 Minutes Intravenous Every 24 hours 01/19/14 1047     01/15/14 1700  doxycycline (VIBRA-TABS) tablet 100 mg     100 mg Oral Every 12 hours 01/15/14 1649        Assessment: Okay for Protocol, patient continues to have cough with diminished breath sounds.  Levaquin added to therapy today. No micro data.  Goal of Therapy:  Eradicate infection.   Plan:  Levaquin 750mg  IV every 24 hours.  Biagio Quint R 01/19/2014,10:53 AM

## 2014-01-19 NOTE — Progress Notes (Signed)
Inpatient Diabetes Program Recommendations  AACE/ADA: New Consensus Statement on Inpatient Glycemic Control (2013)  Target Ranges:  Prepandial:   less than 140 mg/dL      Peak postprandial:   less than 180 mg/dL (1-2 hours)      Critically ill patients:  140 - 180 mg/dL   Results for [name], [name] (MRN [id number]) as of 01/19/2014 07:45  Ref. Range 01/18/2014 07:40 01/18/2014 11:40 01/18/2014 16:34 01/18/2014 21:26 01/19/2014 07:30  Glucose-Capillary Latest Range: 70-99 mg/dL 278 (H) 230 (H) 265 (H) 270 (H) 277 (H)   Diabetes history: DM2  Outpatient Diabetes medications: Tradjenta 5 mg daily  Current orders for Inpatient glycemic control: Novolog 0-20 units AC, Novolog 0-5 units HS  Inpatient Diabetes Program Recommendations Insulin - Basal: If steroids are continued, please consider ordering low dose basal insulin. Recommend starting with Levemir 10 units QHS. Insulin - Meal Coverage: If steroids are continued, please consider ordering Novolog 4 units TID with meals for meal coverage (in addition to Novolog correction scale).  Note: CBGs ranged from 230-278 mg/dl on 01/18/14 and patient received a total of Novolog 32 units for correction on 01/18/14. Fasting glucose is 277 mg/dl this morning and patient is ordered Solumedrol 60 mg Q12H which is contributing to hyperglycemia. Please consider ordering low dose basal insulin and meal coverage while on steroids.  Thanks, Barnie Alderman, RN, MSN, CCRN Diabetes Coordinator Inpatient Diabetes Program [phone] (Team Pager) [phone] ([facility] office) [phone] [facility] office)

## 2014-01-20 DIAGNOSIS — D649 Anemia, unspecified: Secondary | ICD-10-CM

## 2014-01-20 DIAGNOSIS — J9610 Chronic respiratory failure, unspecified whether with hypoxia or hypercapnia: Secondary | ICD-10-CM

## 2014-01-20 LAB — GLUCOSE, CAPILLARY
Glucose-Capillary: 266 mg/dL — ABNORMAL HIGH (ref 70–99)
Glucose-Capillary: 221 mg/dL — ABNORMAL HIGH (ref 70–99)
Glucose-Capillary: 207 mg/dL — ABNORMAL HIGH (ref 70–99)
Glucose-Capillary: 200 mg/dL — ABNORMAL HIGH (ref 70–99)

## 2014-01-20 MED ORDER — POLYETHYLENE GLYCOL 3350 17 G PO PACK: 17.0000 g | PACK | Freq: Every day | ORAL | Status: DC | PRN

## 2014-01-20 MED ORDER — OXYCODONE-ACETAMINOPHEN 5-325 MG PO TABS: 1.0000 | ORAL_TABLET | ORAL | Status: DC | PRN

## 2014-01-20 MED ORDER — PREDNISONE (PAK) 10 MG PO TABS: ORAL_TABLET | Freq: Every day | ORAL | Status: DC

## 2014-01-20 MED ORDER — NICOTINE 21 MG/24HR TD PT24: 21.0000 mg | MEDICATED_PATCH | Freq: Every day | TRANSDERMAL | Status: DC

## 2014-01-20 MED ORDER — GUAIFENESIN-CODEINE 100-10 MG/5ML PO SOLN: 10.0000 mL | ORAL | Status: DC | PRN

## 2014-01-20 MED ORDER — LEVOFLOXACIN 750 MG PO TABS: 750.0000 mg | ORAL_TABLET | Freq: Every day | ORAL | Status: DC

## 2014-01-20 NOTE — Progress Notes (Signed)
Inpatient Diabetes Program Recommendations  AACE/ADA: New Consensus Statement on Inpatient Glycemic Control (2013)  Target Ranges:  Prepandial:   less than 140 mg/dL      Peak postprandial:   less than 180 mg/dL (1-2 hours)      Critically ill patients:  140 - 180 mg/dL  Results for [name], [name] (MRN [id number]) as of 01/20/2014 09:35  Ref. Range 01/18/2014 21:26 01/19/2014 07:30 01/19/2014 11:25 01/19/2014 16:31 01/19/2014 21:23 01/20/2014 07:55  Glucose-Capillary Latest Range: 70-99 mg/dL 270 (H) 277 (H) 185 (H) 218 (H) 266 (H) 221 (H)    Diabetes history: DM2  Outpatient Diabetes medications: Tradjenta 5 mg daily  Current orders for Inpatient glycemic control: Novolog 0-20 units AC, Novolog 0-5 units HS   Inpatient Diabetes Program Recommendations  Insulin - Basal: If steroids are continued, please consider ordering low dose basal insulin. Recommend starting with Levemir 10 units QHS.  Insulin - Meal Coverage: If steroids are continued, please consider ordering Novolog 4 units TID with meals for meal coverage (in addition to Novolog correction scale).   Note: CBGs ranged from 185-277 mg/dl on 01/19/14 and patient received a total of Novolog 25 units for correction on 01/19/14. Fasting glucose is 221 mg/dl this morning and patient is ordered Solumedrol 60 mg Q12H which is contributing to hyperglycemia. Please consider ordering low dose basal insulin and meal coverage while on steroids.  Thanks, Lucious Groves, RD, CDE

## 2014-01-20 NOTE — Discharge Summary (Signed)
Physician Discharge Summary  [name] ACZ:[id number] DOB: 11-21-1965 DOA: 01/15/2014  PCP: Vic Blackbird, MD  Admit date: 01/15/2014 Discharge date: 01/20/2014  Time spent: 45 minutes  Recommendations for Outpatient Follow-up:  Patient will be discharged home. She is to continue using her medications as prescribed. Patient should resume activity as tolerated. Patient was strongly advised to abstain from tobacco use. Patient will need a followup with her primary care physician as well as pulmonologist within one to 2 weeks of discharge. Patient was instructed to continue using her oxygen as well as CPAP machine.   Discharge Diagnoses:  COPD exacerbation Leukocytosis Chest pain Diabetes mellitus, type II Hypertension Hyperlipidemia Tobacco abuse GERD Chronic pain Depression anxiety Normocytic anemia  Discharge Condition: Stable  Diet recommendation: Heart healthy/carb modified  Filed Weights   01/15/14 1052 01/15/14 1720  Weight: 131.543 kg (290 lb) 134 kg (295 lb 6.7 oz)    History of present illness:  on 01/15/2014  [name] is a 48 y.o. female came to the ED on 01/15/2014 with progressive SOB and cough. Gradual worsening for weeks. Significantly worsened when she went to beach this past weekend. Improved with home breathing treatments but O2 was as low as 85 at home. Used her CPAPbut still felt SOB. Does not wear O2 during the day. Smoking 1ppd. Denied fevers, palpitations. Associated w/ CP which comes on w/ deep breathing.   Hospital Course:  COPD exacerbation  -Patient continues to have cough with diminished breath sounds.  -Possibly secondary to change in weather versus chronic smoking  -Chest x-ray: COPD, possible acute bronchitis  -Repeat chest x-ray on 01/19/2014 showed no acute cardiopulmonary process -Continue oxygen supplementation to maintain saturations above 92%  -Continue Levaquin, steroids, DuoNeb treatments, and guaifenesin with codeine    -Patient is to continue using her flutter valve as well as take her medications as prescribed. She was also advised to stop smoking.  Leukocytosis  -Resolving, Likely reactive secondary to steroids   Chest pain  -Resolved, Likely pleuritic and musculoskeletal  -Worsened with deep inspiration  -Troponins negative x3   Diabetes mellitus, type 2  -Last hemoglobin A1c in March of 2015 was 6.7  -During hospitalization patient was placed on insulin sliding scale with CBG monitoring  -Medications were held however patient may restart upon discharge.  Hypertension  -Continue losartan   Hyperlipidemia  -Continue statin   Tobacco abuse  -Advised to stop smoking and counseled.  -Continue nicotine patch   GERD  -Continue PPI   Chronic pain  -Continue home medications of robaxin, Lyrica, oxycodone   Depression and anxiety  -Continue her medications   Normocytic anemia  -Hb remains stable, will continue to monitor CBC  Procedures:  None  Consultations:  None  Discharge Exam: Filed Vitals:   01/20/14 0601  BP: 153/67  Pulse: 80  Temp: 97.9 F (36.6 C)  Resp: 18   Exam  General: Well developed, well nourished, NAD, appears stated age  HEENT: NCAT, mucous membranes moist. Circular scar noted on right temple.  Cardiovascular: S1 S2 auscultated, no rubs, murmurs or gallops. Regular rate and rhythm.  Respiratory: Good air movement, mild scattered wheezing Abdomen: Soft, obese, nontender, nondistended, + bowel sounds  Extremities: warm dry without cyanosis clubbing or edema  Neuro: AAOx3, no focal deficits Psych: Appropriate mood and affect  Discharge Instructions     Medication List         albuterol (2.5 MG/3ML) 0.083% nebulizer solution  Commonly known as:  PROVENTIL  Take 3  mLs (2.5 mg total) by nebulization every 6 (six) hours as needed for wheezing.     atorvastatin 10 MG tablet  Commonly known as:  LIPITOR  Take 1 tablet (10 mg total) by mouth daily.      DULoxetine 60 MG capsule  Commonly known as:  CYMBALTA  Take 1 capsule (60 mg total) by mouth daily.     EQ LORATADINE 10 MG tablet  Generic drug:  loratadine  TAKE ONE TABLET BY MOUTH ONCE DAILY     famotidine 20 MG tablet  Commonly known as:  PEPCID  Take 20 mg by mouth at bedtime.     fluticasone 50 MCG/ACT nasal spray  Commonly known as:  FLONASE  Place 2 sprays into the nose daily.     guaiFENesin-codeine 100-10 MG/5ML syrup  Take 10 mLs by mouth every 4 (four) hours as needed for cough.     ibuprofen 200 MG tablet  Commonly known as:  ADVIL,MOTRIN  Take 200-400 mg by mouth every 6 (six) hours as needed for pain. For pain     levofloxacin 750 MG tablet  Commonly known as:  LEVAQUIN  Take 1 tablet (750 mg total) by mouth daily.     linagliptin 5 MG Tabs tablet  Commonly known as:  TRADJENTA  Take 1 tablet (5 mg total) by mouth daily.     loperamide 2 MG tablet  Commonly known as:  IMODIUM A-D  Take 2 mg by mouth daily as needed for diarrhea or loose stools.     losartan 50 MG tablet  Commonly known as:  COZAAR  Take 50 mg by mouth daily.     methocarbamol 500 MG tablet  Commonly known as:  ROBAXIN  Take 1 tablet (500 mg total) by mouth 2 (two) times daily.     methylphenidate 20 MG tablet  Commonly known as:  RITALIN  Take 1 tablet (20 mg total) by mouth 2 (two) times daily with breakfast and lunch.     mometasone-formoterol 100-5 MCG/ACT Aero  Commonly known as:  DULERA  Inhale 2 puffs into the lungs 2 (two) times daily.     nicotine 21 mg/24hr patch  Commonly known as:  NICODERM CQ - dosed in mg/24 hours  Place 1 patch (21 mg total) onto the skin daily.     ofloxacin 0.3 % otic solution  Commonly known as:  FLOXIN  Place 5 drops into the right ear daily.     omeprazole 40 MG capsule  Commonly known as:  PRILOSEC  Take 40 mg by mouth daily.     oxyCODONE-acetaminophen 5-325 MG per tablet  Commonly known as:  PERCOCET/ROXICET  Take 1 tablet  by mouth every 4 (four) hours as needed for moderate pain or severe pain.     polyethylene glycol packet  Commonly known as:  MIRALAX / GLYCOLAX  Take 17 g by mouth daily as needed for mild constipation.     predniSONE 10 MG tablet  Commonly known as:  STERAPRED UNI-PAK  - Take by mouth daily. Prednisone dosing: Take  Prednisone 40mg  (4 tabs) x 3 days, then taper to 30mg  (3 tabs) x 3 days, then 20mg  (2 tabs) x 3days, then 10mg  (1 tab) x 3days, then OFF.  -   - Dispense:  30 tabs, refills: None     pregabalin 150 MG capsule  Commonly known as:  LYRICA  Take 150 mg by mouth 2 (two) times daily.     tapentadol 50 MG Tabs tablet  Commonly known as:  NUCYNTA  Take 50 mg by mouth 3 (three) times daily as needed for moderate pain or severe pain.     Vitamin D (Ergocalciferol) 50000 UNITS Caps capsule  Commonly known as:  DRISDOL  Take 50,000 Units by mouth every 7 (seven) days. On Thursday.       Allergies  Allergen Reactions  . Codeine Itching and Nausea Only  . Wellbutrin [Bupropion Hcl] Hives   Follow-up Information   Follow up with Vic Blackbird, MD. Schedule an appointment as soon as possible for a visit in 1 week. [facility] followup)    Specialty:  Family Medicine   Contact information:   [address] [place] [address] [phone]       Follow up with Pulmonologist. Schedule an appointment as soon as possible for a visit in 1 week. [facility] followup)        The results of significant diagnostics from this hospitalization (including imaging, microbiology, ancillary and laboratory) are listed below for reference.    Significant Diagnostic Studies: Dg Chest 2 View  01/19/2014   CLINICAL DATA:  Cough, congestion and shortness of breath.  EXAM: CHEST  2 VIEW  COMPARISON:  01/15/2014 and 10/18/2013  FINDINGS: Stable appearance of the heart and mediastinum. Again noted are linear densities in the lower lungs, particularly on the left side. No evidence for  airspace disease. No pleural effusions. No acute bone abnormality.  IMPRESSION: No acute cardiopulmonary disease.  Linear densities at the left lung base are suggestive for scarring and/or atelectasis.   Electronically Signed   By: Markus Daft M.D.   On: 01/19/2014 10:41   Dg Chest 2 View  01/15/2014   CLINICAL DATA:  Two weeks of cough with 1 week of shortness of breath ; history of COPD, diabetes, and hypertension.  EXAM: CHEST  2 VIEW  COMPARISON:  PA and lateral chest of October 18, 2013  FINDINGS: The lungs are well-expanded. There is mild hemidiaphragm flattening. The heart is normal in size. There are coarse infrahilar lung markings bilaterally which are not entirely new. The pulmonary vascularity is not engorged. The mediastinum is normal in width. There is no pleural effusion. The bony thorax is unremarkable.  IMPRESSION: COPD. Chronically infrahilar lung markings bilaterally likely reflects scarring. One cannot exclude superimposed acute bronchitis in the appropriate clinical setting.   Electronically Signed   By: David  Martinique   On: 01/15/2014 12:05    Microbiology: Recent Results (from the past 240 hour(s))  MRSA PCR SCREENING     Status: None   Collection Time    01/16/14 12:00 PM      Result Value Ref Range Status   MRSA by PCR NEGATIVE  NEGATIVE Final   Comment:            The GeneXpert MRSA Assay (FDA     approved for NASAL specimens     only), is one component of a     comprehensive MRSA colonization     surveillance program. It is not     intended to diagnose MRSA     infection nor to guide or     monitor treatment for     MRSA infections.     Labs: Basic Metabolic Panel:  Recent Labs Lab 01/15/14 1150 01/16/14 0501 01/17/14 0622 01/19/14 0556  NA 139 139 140 140  K 4.0 4.4 4.3 4.9  CL 101 101 100 98  CO2 27 27 25 31   GLUCOSE 99 215*  221* 267*  BUN 6 9 15 17   CREATININE 0.67 0.71 0.69 0.75  CALCIUM 9.0 9.0 9.6 8.9   Liver Function Tests:  Recent Labs Lab  01/16/14 0501  AST 10  ALT 9  ALKPHOS 58  BILITOT 0.3  PROT 7.1  ALBUMIN 3.3*   No results found for this basename: LIPASE, AMYLASE,  in the last 168 hours No results found for this basename: AMMONIA,  in the last 168 hours CBC:  Recent Labs Lab 01/15/14 1150 01/17/14 0622 01/19/14 0556  WBC 9.4 22.9* 12.4*  NEUTROABS 6.2  --   --   HGB 10.2* 9.7* 9.5*  HCT 35.0* 33.9* 32.8*  MCV 70.6* 71.5* 70.8*  PLT 265 301 251   Cardiac Enzymes:  Recent Labs Lab 01/15/14 1150 01/15/14 2230 01/16/14 0501  TROPONINI <0.30 <0.30 <0.30   BNP: BNP (last 3 results)  Recent Labs  10/18/13 1722  PROBNP 264.4*   CBG:  Recent Labs Lab 01/19/14 0730 01/19/14 1125 01/19/14 1631 01/19/14 2123 01/20/14 0755  GLUCAP 277* 185* 218* 266* 221*       Signed:  Stpehanie Montroy  [facility] 01/20/2014, 10:54 AM

## 2014-01-20 NOTE — Discharge Instructions (Signed)
Smoking Cessation Quitting smoking is important to your health and has many advantages. However, it is not always easy to quit since nicotine is a very addictive drug. Oftentimes, people try 3 times or more before being able to quit. This document explains the best ways for you to prepare to quit smoking. Quitting takes hard work and a lot of effort, but you can do it. ADVANTAGES OF QUITTING SMOKING  You will live longer, feel better, and live better.  Your body will feel the impact of quitting smoking almost immediately.  Within 20 minutes, blood pressure decreases. Your pulse returns to its normal level.  After 8 hours, carbon monoxide levels in the blood return to normal. Your oxygen level increases.  After 24 hours, the chance of having a heart attack starts to decrease. Your breath, hair, and body stop smelling like smoke.  After 48 hours, damaged nerve endings begin to recover. Your sense of taste and smell improve.  After 72 hours, the body is virtually free of nicotine. Your bronchial tubes relax and breathing becomes easier.  After 2 to 12 weeks, lungs can hold more air. Exercise becomes easier and circulation improves.  The risk of having a heart attack, stroke, cancer, or lung disease is greatly reduced.  After 1 year, the risk of coronary heart disease is cut in half.  After 5 years, the risk of stroke falls to the same as a nonsmoker.  After 10 years, the risk of lung cancer is cut in half and the risk of other cancers decreases significantly.  After 15 years, the risk of coronary heart disease drops, usually to the level of a nonsmoker.  If you are pregnant, quitting smoking will improve your chances of having a healthy baby.  The people you live with, especially any children, will be healthier.  You will have extra money to spend on things other than cigarettes. QUESTIONS TO THINK ABOUT BEFORE ATTEMPTING TO QUIT You may want to talk about your answers with your  health care provider.  Why do you want to quit?  If you tried to quit in the past, what helped and what did not?  What will be the most difficult situations for you after you quit? How will you plan to handle them?  Who can help you through the tough times? Your family? Friends? A health care provider?  What pleasures do you get from smoking? What ways can you still get pleasure if you quit? Here are some questions to ask your health care provider:  How can you help me to be successful at quitting?  What medicine do you think would be best for me and how should I take it?  What should I do if I need more help?  What is smoking withdrawal like? How can I get information on withdrawal? GET READY  Set a quit date.  Change your environment by getting rid of all cigarettes, ashtrays, matches, and lighters in your home, car, or work. Do not let people smoke in your home.  Review your past attempts to quit. Think about what worked and what did not. GET SUPPORT AND ENCOURAGEMENT You have a better chance of being successful if you have help. You can get support in many ways.  Tell your family, friends, and coworkers that you are going to quit and need their support. Ask them not to smoke around you.  Get individual, group, or telephone counseling and support. Programs are available at [employer] and health centers. Call  your local health department for information about programs in your area.  Spiritual beliefs and practices may help some smokers quit.  Download a "quit meter" on your computer to keep track of quit statistics, such as how long you have gone without smoking, cigarettes not smoked, and money saved.  Get a self-help book about quitting smoking and staying off tobacco. [place] yourself from urges to smoke. Talk to someone, go for a walk, or occupy your time with a task.  Change your normal routine. Take a different route to work.  Drink tea instead of coffee. Eat breakfast in a different place.  Reduce your stress. Take a hot bath, exercise, or read a book.  Plan something enjoyable to do every day. Reward yourself for not smoking.  Explore interactive web-based programs that specialize in helping you quit. GET MEDICINE AND USE IT CORRECTLY Medicines can help you stop smoking and decrease the urge to smoke. Combining medicine with the above behavioral methods and support can greatly increase your chances of successfully quitting smoking.  Nicotine replacement therapy helps deliver nicotine to your body without the negative effects and risks of smoking. Nicotine replacement therapy includes nicotine gum, lozenges, inhalers, nasal sprays, and skin patches. Some may be available over-the-counter and others require a prescription.  Antidepressant medicine helps people abstain from smoking, but how this works is unknown. This medicine is available by prescription.  Nicotinic receptor partial agonist medicine simulates the effect of nicotine in your brain. This medicine is available by prescription. Ask your health care provider for advice about which medicines to use and how to use them based on your health history. Your health care provider will tell you what side effects to look out for if you choose to be on a medicine or therapy. Carefully read the information on the package. Do not use any other product containing nicotine while using a nicotine replacement product.  RELAPSE OR DIFFICULT SITUATIONS Most relapses occur within the first 3 months after quitting. Do not be discouraged if you start smoking again. Remember, most people try several times before finally quitting. You may have symptoms of withdrawal because your body is used to nicotine. You may crave cigarettes, be irritable, feel very hungry, cough often, get headaches, or have difficulty concentrating. The withdrawal symptoms are only temporary. They are strongest  when you first quit, but they will go away within 10-14 days. To reduce the chances of relapse, try to:  Avoid drinking alcohol. Drinking lowers your chances of successfully quitting.  Reduce the amount of caffeine you consume. Once you quit smoking, the amount of caffeine in your body increases and can give you symptoms, such as a rapid heartbeat, sweating, and anxiety.  Avoid smokers because they can make you want to smoke.  Do not let weight gain distract you. Many smokers will gain weight when they quit, usually less than 10 pounds. Eat a healthy diet and stay active. You can always lose the weight gained after you quit.  Find ways to improve your mood other than smoking. FOR MORE INFORMATION  www.smokefree.gov  Document Released: 04/04/2001 Document Revised: 08/25/2013 Document Reviewed: 07/20/2011 [facility] Patient Information 2015 [place], [place]. This information is not intended to replace advice given to you by your health care provider. Make sure you discuss any questions you have with your health care provider. Chronic Obstructive Pulmonary Disease Chronic obstructive pulmonary disease (COPD) is a common lung condition in which airflow from the lungs is limited.  COPD is a general term that can be used to describe many different lung problems that limit airflow, including both chronic bronchitis and emphysema. If you have COPD, your lung function will probably never return to normal, but there are measures you can take to improve lung function and make yourself feel better.  CAUSES   Smoking (common).   Exposure to secondhand smoke.   Genetic problems.  Chronic inflammatory lung diseases or recurrent infections. SYMPTOMS   Shortness of breath, especially with physical activity.   Deep, persistent (chronic) cough with a large amount of thick mucus.   Wheezing.   Rapid breaths (tachypnea).   Gray or bluish discoloration (cyanosis) of the skin, especially in fingers,  toes, or lips.   Fatigue.   Weight loss.   Frequent infections or episodes when breathing symptoms become much worse (exacerbations).   Chest tightness. DIAGNOSIS  Your health care provider will take a medical history and perform a physical examination to make the initial diagnosis. Additional tests for COPD may include:   Lung (pulmonary) function tests.  Chest X-ray.  CT scan.  Blood tests. TREATMENT  Treatment available to help you feel better when you have COPD includes:   Inhaler and nebulizer medicines. These help manage the symptoms of COPD and make your breathing more comfortable.  Supplemental oxygen. Supplemental oxygen is only helpful if you have a low oxygen level in your blood.   Exercise and physical activity. These are beneficial for nearly all people with COPD. Some people may also benefit from a pulmonary rehabilitation program. HOME CARE INSTRUCTIONS   Take all medicines (inhaled or pills) as directed by your health care provider.  Avoid over-the-counter medicines or cough syrups that dry up your airway (such as antihistamines) and slow down the elimination of secretions unless instructed otherwise by your health care provider.   If you are a smoker, the most important thing that you can do is stop smoking. Continuing to smoke will cause further lung damage and breathing trouble. Ask your health care provider for help with quitting smoking. He or she can direct you to community resources or hospitals that provide support.  Avoid exposure to irritants such as smoke, chemicals, and fumes that aggravate your breathing.  Use oxygen therapy and pulmonary rehabilitation if directed by your health care provider. If you require home oxygen therapy, ask your health care provider whether you should purchase a pulse oximeter to measure your oxygen level at home.   Avoid contact with individuals who have a contagious illness.  Avoid extreme temperature and  humidity changes.  Eat healthy foods. Eating smaller, more frequent meals and resting before meals may help you maintain your strength.  Stay active, but balance activity with periods of rest. Exercise and physical activity will help you maintain your ability to do things you want to do.  Preventing infection and hospitalization is very important when you have COPD. Make sure to receive all the vaccines your health care provider recommends, especially the pneumococcal and influenza vaccines. Ask your health care provider whether you need a pneumonia vaccine.  Learn and use relaxation techniques to manage stress.  Learn and use controlled breathing techniques as directed by your health care provider. Controlled breathing techniques include:   Pursed lip breathing. Start by breathing in (inhaling) through your nose for 1 second. Then, purse your lips as if you were going to whistle and breathe out (exhale) through the pursed lips for 2 seconds.   Diaphragmatic breathing. Start by putting  one hand on your abdomen just above your waist. Inhale slowly through your nose. The hand on your abdomen should move out. Then purse your lips and exhale slowly. You should be able to feel the hand on your abdomen moving in as you exhale.   Learn and use controlled coughing to clear mucus from your lungs. Controlled coughing is a series of short, progressive coughs. The steps of controlled coughing are:  1. Lean your head slightly forward.  2. Breathe in deeply using diaphragmatic breathing.  3. Try to hold your breath for 3 seconds.  4. Keep your mouth slightly open while coughing twice.  5. Spit any mucus out into a tissue.  6. Rest and repeat the steps once or twice as needed. SEEK MEDICAL CARE IF:   You are coughing up more mucus than usual.   There is a change in the color or thickness of your mucus.   Your breathing is more labored than usual.   Your breathing is faster than usual.   SEEK IMMEDIATE MEDICAL CARE IF:   You have shortness of breath while you are resting.   You have shortness of breath that prevents you from:  Being able to talk.   Performing your usual physical activities.   You have chest pain lasting longer than 5 minutes.   Your skin color is more cyanotic than usual.  You measure low oxygen saturations for longer than 5 minutes with a pulse oximeter. MAKE SURE YOU:   Understand these instructions.  Will watch your condition.  Will get help right away if you are not doing well or get worse. Document Released: 01/18/2005 Document Revised: 08/25/2013 Document Reviewed: 12/05/2012 [facility] Patient Information 2015 [place], [place]. This information is not intended to replace advice given to you by your health care provider. Make sure you discuss any questions you have with your health care provider.

## 2014-01-20 NOTE — Progress Notes (Signed)
UR chart review completed.  

## 2014-01-20 NOTE — Progress Notes (Signed)
Patient discharged with son at bedside. Vitals stable and discharge instructions given and patient educated on smoking cessation and all medications.  Patient will follow up with primary MD.

## 2014-01-23 ENCOUNTER — Emergency Department (HOSPITAL_COMMUNITY)
Admission: EM | Admit: 2014-01-23 | Discharge: 2014-01-23 | Disposition: A | Discharge status: Home / Self Care | Payer: Medicare HMO | Attending: Emergency Medicine | Admitting: Emergency Medicine

## 2014-01-23 ENCOUNTER — Emergency Department (HOSPITAL_COMMUNITY): Payer: Medicare HMO

## 2014-01-23 ENCOUNTER — Encounter (HOSPITAL_COMMUNITY): Payer: Self-pay | Admitting: Emergency Medicine

## 2014-01-23 DIAGNOSIS — F329 Major depressive disorder, single episode, unspecified: Secondary | ICD-10-CM | POA: Insufficient documentation

## 2014-01-23 DIAGNOSIS — E119 Type 2 diabetes mellitus without complications: Secondary | ICD-10-CM | POA: Insufficient documentation

## 2014-01-23 DIAGNOSIS — K219 Gastro-esophageal reflux disease without esophagitis: Secondary | ICD-10-CM | POA: Diagnosis not present

## 2014-01-23 DIAGNOSIS — F419 Anxiety disorder, unspecified: Secondary | ICD-10-CM | POA: Insufficient documentation

## 2014-01-23 DIAGNOSIS — Y929 Unspecified place or not applicable: Secondary | ICD-10-CM | POA: Insufficient documentation

## 2014-01-23 DIAGNOSIS — Z79899 Other long term (current) drug therapy: Secondary | ICD-10-CM | POA: Insufficient documentation

## 2014-01-23 DIAGNOSIS — Y939 Activity, unspecified: Secondary | ICD-10-CM | POA: Diagnosis not present

## 2014-01-23 DIAGNOSIS — X58XXXA Exposure to other specified factors, initial encounter: Secondary | ICD-10-CM | POA: Diagnosis not present

## 2014-01-23 DIAGNOSIS — Z7951 Long term (current) use of inhaled steroids: Secondary | ICD-10-CM | POA: Diagnosis not present

## 2014-01-23 DIAGNOSIS — S8982XA Other specified injuries of left lower leg, initial encounter: Secondary | ICD-10-CM | POA: Insufficient documentation

## 2014-01-23 DIAGNOSIS — J441 Chronic obstructive pulmonary disease with (acute) exacerbation: Secondary | ICD-10-CM | POA: Insufficient documentation

## 2014-01-23 DIAGNOSIS — Z720 Tobacco use: Secondary | ICD-10-CM | POA: Diagnosis not present

## 2014-01-23 DIAGNOSIS — Z7952 Long term (current) use of systemic steroids: Secondary | ICD-10-CM | POA: Insufficient documentation

## 2014-01-23 DIAGNOSIS — Z79891 Long term (current) use of opiate analgesic: Secondary | ICD-10-CM | POA: Diagnosis not present

## 2014-01-23 DIAGNOSIS — M791 Myalgia, unspecified site: Secondary | ICD-10-CM

## 2014-01-23 DIAGNOSIS — Z862 Personal history of diseases of the blood and blood-forming organs and certain disorders involving the immune mechanism: Secondary | ICD-10-CM | POA: Insufficient documentation

## 2014-01-23 MED ORDER — OXYCODONE-ACETAMINOPHEN 5-325 MG PO TABS: 2.0000 | ORAL_TABLET | ORAL | Status: DC | PRN

## 2014-01-23 NOTE — ED Notes (Signed)
Pain lt leg, pt as walking and felt a pop in calf of leg, Now cannot  Walk on this leg.

## 2014-01-23 NOTE — ED Notes (Signed)
Patient with no complaints at this time. Respirations even and unlabored. Skin warm/dry. Discharge instructions reviewed with patient at this time. Instructed on crutches use w/return demo. Patient given opportunity to voice concerns/ask questions. Patient discharged at this time and left Emergency Department with using crutches.

## 2014-01-23 NOTE — ED Provider Notes (Signed)
CSN: [id number]     Arrival date & time 01/23/14  1640 History   First MD Initiated Contact with Patient 01/23/14 1738     Chief Complaint  Patient presents with  . Leg Pain     (Consider location/radiation/quality/duration/timing/severity/associated sxs/prior Treatment) Patient is a 48 y.o. female presenting with leg pain. The history is provided by the patient.  Leg Pain Location:  Leg  [name] is a 48 y.o. female who presents to the ED with left lower leg pain. She states that she was in her bedroom and stepped the wrong way and felt a pop in her lower leg. The injury happened approximately 2:30 pm. She took 2 Percocet and it did help the pain some but has continued to cause difficulty walking.   Past Medical History  Diagnosis Date  . Diabetes mellitus   . Asthmatic bronchitis     normal PFT/ seen by pulmonary no evidence of COPD  . HTN (hypertension)   . Low back pain   . Tachycardia     never had test done since no insurance  . Depression   . Shortness of breath   . Anxiety   . Gastric erosions     EGD 08/2010.  . Internal hemorrhoids     Colonoscopy 5/12.  [name] Heavy menses   . Chronic respiratory failure with hypoxia     On 2-3 L of oxygen at home  . GERD (gastroesophageal reflux disease)   . Anemia   . Arthritis   . Sleep apnea   . COPD (chronic obstructive pulmonary disease)    Past Surgical History  Procedure Laterality Date  . Cholecystectomy  1990  . Cesarean section      twice  . Kidney surgery      as child for blockages  . Tubal ligation    . Tonsillectomy    . Wrist surgery  1995    Lt wrist  . Tympanostomy tube placement    . Hysteroscopy with thermachoice  01/17/2012    Procedure: HYSTEROSCOPY WITH THERMACHOICE;  Surgeon: Florian Buff, MD;  Location: [facility];  Service: Gynecology;  Laterality: N/A;  total therapy time: 9:13sec  D5W  18 ml in, D5W   43ml out, temperture 87degrees celcious  . Uterine ablation     Family History  Problem  Relation Age of Onset  . Heart attack Father 26    deceased, etoh use  . Heart disease Father   . Alcohol abuse Father   . Depression Father   . Heart attack Mother 19    deceased  . Diabetes Mother   . Breast cancer Mother   . Heart failure Mother     oxygen dependence, nonsmoker  . Heart disease Mother   . Depression Mother   . Cancer Mother   . Colon cancer Neg Hx   . Liver disease Maternal Aunt 34    died while on liver transplant list  . Heart attack Maternal Grandmother     premature CAD  . Ulcers Sister   . Hypertension Sister    History  Substance Use Topics  . Smoking status: Current Every Day Smoker -- 1.00 packs/day for 30 years    Types: Cigarettes    Last Attempt to Quit: 01/27/2013  . Smokeless tobacco: Never Used     Comment: using the vapor cigarettes  . Alcohol Use: Yes     Comment: social use   OB History   Grav Para Term Preterm Abortions  TAB SAB Ect Mult Living   2 2 2       2      Review of Systems Negative except as stated in HPI   Allergies  Codeine and Wellbutrin  Home Medications   Prior to Admission medications   Medication Sig Start Date End Date Taking? Authorizing Provider  albuterol (PROVENTIL) (2.5 MG/3ML) 0.083% nebulizer solution Take 3 mLs (2.5 mg total) by nebulization every 6 (six) hours as needed for wheezing. 02/03/13   Tanda Rockers, MD  atorvastatin (LIPITOR) 10 MG tablet Take 1 tablet (10 mg total) by mouth daily. 10/06/13   Susy Frizzle, MD  DULoxetine (CYMBALTA) 60 MG capsule Take 1 capsule (60 mg total) by mouth daily. 01/06/14 01/06/15  [name], MD  EQ LORATADINE 10 MG tablet TAKE ONE TABLET BY MOUTH ONCE DAILY 10/31/13   Alycia Rossetti, MD  famotidine (PEPCID) 20 MG tablet Take 20 mg by mouth at bedtime.    Historical Provider, MD  fluticasone (FLONASE) 50 MCG/ACT nasal spray Place 2 sprays into the nose daily. 01/28/13   Alycia Rossetti, MD  guaiFENesin-codeine 100-10 MG/5ML syrup Take 10 mLs by mouth every 4  (four) hours as needed for cough. 01/20/14   Maryann Mikhail, DO  ibuprofen (ADVIL,MOTRIN) 200 MG tablet Take 200-400 mg by mouth every 6 (six) hours as needed for pain. For pain    Historical Provider, MD  levofloxacin (LEVAQUIN) 750 MG tablet Take 1 tablet (750 mg total) by mouth daily. 01/20/14   Maryann Mikhail, DO  linagliptin (TRADJENTA) 5 MG TABS tablet Take 1 tablet (5 mg total) by mouth daily. 10/31/13   Alycia Rossetti, MD  loperamide (IMODIUM A-D) 2 MG tablet Take 2 mg by mouth daily as needed for diarrhea or loose stools.    Historical Provider, MD  losartan (COZAAR) 50 MG tablet Take 50 mg by mouth daily.    Historical Provider, MD  methocarbamol (ROBAXIN) 500 MG tablet Take 1 tablet (500 mg total) by mouth 2 (two) times daily. 12/12/13   Alycia Rossetti, MD  methylphenidate (RITALIN) 20 MG tablet Take 1 tablet (20 mg total) by mouth 2 (two) times daily with breakfast and lunch. 01/06/14   [name], MD  mometasone-formoterol (DULERA) 100-5 MCG/ACT AERO Inhale 2 puffs into the lungs 2 (two) times daily. 04/07/13   Alycia Rossetti, MD  nicotine (NICODERM CQ - DOSED IN MG/24 HOURS) 21 mg/24hr patch Place 1 patch (21 mg total) onto the skin daily. 01/20/14   Maryann Mikhail, DO  ofloxacin (FLOXIN) 0.3 % otic solution Place 5 drops into the right ear daily.    Historical Provider, MD  omeprazole (PRILOSEC) 40 MG capsule Take 40 mg by mouth daily.    Historical Provider, MD  oxyCODONE-acetaminophen (PERCOCET/ROXICET) 5-325 MG per tablet Take 1 tablet by mouth every 4 (four) hours as needed for moderate pain or severe pain. 01/20/14   Maryann Mikhail, DO  polyethylene glycol (MIRALAX / GLYCOLAX) packet Take 17 g by mouth daily as needed for mild constipation. 01/20/14   Maryann Mikhail, DO  predniSONE (STERAPRED UNI-PAK) 10 MG tablet Take by mouth daily. Prednisone dosing: Take  Prednisone 40mg  (4 tabs) x 3 days, then taper to 30mg  (3 tabs) x 3 days, then 20mg  (2 tabs) x 3days, then 10mg  (1 tab) x  3days, then OFF.  Dispense:  30 tabs, refills: None 01/20/14   Maryann Mikhail, DO  pregabalin (LYRICA) 150 MG capsule Take 150 mg by mouth 2 (two)  times daily.    Historical Provider, MD  tapentadol (NUCYNTA) 50 MG TABS tablet Take 50 mg by mouth 3 (three) times daily as needed for moderate pain or severe pain.     Historical Provider, MD  Vitamin D, Ergocalciferol, (DRISDOL) 50000 UNITS CAPS capsule Take 50,000 Units by mouth every 7 (seven) days. On Thursday.    Historical Provider, MD   BP 109/92  Pulse 92  Temp(Src) 99.1 F (37.3 C) (Oral)  Ht 5\' 4"  (1.626 m)  Wt 290 lb (131.543 kg)  BMI 49.75 kg/m2  SpO2 97%  LMP 12/06/2013 Physical Exam  Nursing note and vitals reviewed. Constitutional: She is oriented to person, place, and time. She appears well-developed and well-nourished.  HENT:  Head: Normocephalic and atraumatic.  Eyes: EOM are normal.  Neck: Normal range of motion. Neck supple.  Cardiovascular: Normal rate.   Pulmonary/Chest: Effort normal.  Musculoskeletal:       Left lower leg: She exhibits tenderness. She exhibits no swelling, no deformity and no laceration.       Legs: Pedal pulses equal, adequate circulation, good touch sensation, good strength with pressing foot downward and upward. Pain with palpation of the lower leg posterior aspect.   Neurological: She is alert and oriented to person, place, and time. No cranial nerve deficit.  Skin: Skin is warm and dry.  Psychiatric: She has a normal mood and affect. Her behavior is normal.    ED Course  Procedures  Dg Tibia/fibula Left  01/23/2014   CLINICAL DATA:  Pain involving the central aspect of the left calf after walking in her bedroom earlier today. Initial encounter.  EXAM: LEFT TIBIA AND FIBULA - 2 VIEW  COMPARISON:  Left ankle radiographs - 03/16/2007  FINDINGS: No fracture or dislocation. Limited visualization of the adjacent knee and ankle is normal given obliquity. Minimal enthesopathic change of the  Achilles tendon insertion site. There is preservation of Kager's fat pad. Regional soft tissues appear normal. No radiopaque foreign body.  IMPRESSION: No definite explanation for patient's calf pain.   Electronically Signed   By: Sandi Mariscal M.D.   On: 01/23/2014 19:37    MDM  48 y.o. female with pain to the left lower leg s/p injury. Ice, rest, crutches and follow up with her doctor. I have reviewed this patient's vital signs, nurses notes, appropriate labs and imaging.  I have discussed findings with the patient and plan of care and she voices understanding. Stable for discharge without neurovascular deficits.  Will give 6 percocet until she can see her PCP.      [place], [place] 01/24/14 [phone]

## 2014-01-26 NOTE — ED Provider Notes (Signed)
Medical screening examination/treatment/procedure(s) were performed by non-physician practitioner and as supervising physician I was immediately available for consultation/collaboration.   EKG Interpretation None      Rolland Porter, MD, Abram Sander   Janice Norrie, MD 01/26/14 2221

## 2014-01-30 ENCOUNTER — Encounter: Payer: Self-pay | Admitting: Family Medicine

## 2014-01-30 ENCOUNTER — Ambulatory Visit (INDEPENDENT_AMBULATORY_CARE_PROVIDER_SITE_OTHER): Payer: Medicare HMO | Admitting: Family Medicine

## 2014-01-30 VITALS — BP 128/82 | HR 88 | Temp 98.7°F | Resp 16 | Ht 62.0 in | Wt 283.0 lb

## 2014-01-30 DIAGNOSIS — E1149 Type 2 diabetes mellitus with other diabetic neurological complication: Secondary | ICD-10-CM

## 2014-01-30 DIAGNOSIS — M545 Low back pain, unspecified: Secondary | ICD-10-CM

## 2014-01-30 DIAGNOSIS — Z720 Tobacco use: Secondary | ICD-10-CM

## 2014-01-30 DIAGNOSIS — M5136 Other intervertebral disc degeneration, lumbar region: Secondary | ICD-10-CM

## 2014-01-30 DIAGNOSIS — J441 Chronic obstructive pulmonary disease with (acute) exacerbation: Secondary | ICD-10-CM

## 2014-01-30 DIAGNOSIS — I10 Essential (primary) hypertension: Secondary | ICD-10-CM

## 2014-01-30 DIAGNOSIS — M51369 Other intervertebral disc degeneration, lumbar region without mention of lumbar back pain or lower extremity pain: Secondary | ICD-10-CM

## 2014-01-30 MED ORDER — GUAIFENESIN-CODEINE 100-10 MG/5ML PO SOLN: 10.0000 mL | ORAL | Status: DC | PRN

## 2014-01-30 NOTE — Patient Instructions (Signed)
Start the daliresp once your finish prednisone Referral to new pain clinic F/U 3 months

## 2014-02-01 NOTE — Assessment & Plan Note (Signed)
Recent admission, improving,schedule visit with pulmonary She has had multiple COPD exacerbation and hosptilizations, many when uninsured. Will give her trial of Daliresp give 7 day sample from office Robitussin Codiene refilled

## 2014-02-01 NOTE — Progress Notes (Signed)
Patient ID: [name], female   DOB: 1965/09/03, 48 y.o.   MRN: [id number]   Subjective:    Patient ID: [name], female    DOB: 03-23-1966, 48 y.o.   MRN: [id number]  Patient presents for Hospital F/U and Medication Review Admitted last month with COPD exacerbation, antibiotics completed, now on 10mg  taper of prednisone. Out of cough medication but overall improving. Still feels fatigued. She is using CPAP at bedtime and oxygen during day as needed. Continues to smoke.   Followed by psychiatry for MDDand anxiety, also on ritalin for chronic fatigue  Was followed by Dr. Merlene Laughter for pain management but she is not happy with care, states Nucynta does not help and they will not change her pain meds. She stopped going to clinic last month, request a new pain doctor.    Review Of Systems:  GEN- denies fatigue, fever, weight loss,weakness, recent illness HEENT- denies eye drainage, change in vision, nasal discharge, CVS- denies chest pain, palpitations RESP- denies SOB, +cough, +wheeze ABD- denies N/V, change in stools, abd pain GU- denies dysuria, hematuria, dribbling, incontinence MSK-+joint pain, muscle aches, injury Neuro- denies headache, dizziness, syncope, seizure activity       Objective:    BP 128/82  Pulse 88  Temp(Src) 98.7 F (37.1 C) (Oral)  Resp 16  Ht 5\' 2"  (1.575 m)  Wt 283 lb (128.368 kg)  BMI 51.75 kg/m2  LMP 01/06/2014 GEN- NAD, alert and oriented x3, obese HEENT- PERRL, EOMI, non injected sclera, pink conjunctiva, MMM, oropharynx clear Neck- Supple, no LAD CVS- RRR, no murmur RESP-mild scatterd wheeze, no rhonchi, normal WOB,  Psych- flat affect, fatigued appearing, not  depressed or anxious, no SI, normal speech EXT- No edema Pulses- Radial, DP- 2+       Assessment & Plan:      Problem List Items Addressed This Visit   None      Note: This dictation was prepared with Dragon dictation along with smaller phrase technology. Any  transcriptional errors that result from this process are unintentional.

## 2014-02-01 NOTE — Assessment & Plan Note (Signed)
A1C shows good control, no change to meds

## 2014-02-01 NOTE — Assessment & Plan Note (Signed)
Well controlled 

## 2014-02-01 NOTE — Assessment & Plan Note (Signed)
Chronic back pain, DDD, disc bulging, refer to new pain clinic

## 2014-02-02 ENCOUNTER — Ambulatory Visit (HOSPITAL_COMMUNITY): Payer: Self-pay | Admitting: Psychology

## 2014-02-02 MED FILL — Oxycodone w/ Acetaminophen Tab 5-325 MG: ORAL | Qty: 6 | Status: AC

## 2014-02-04 ENCOUNTER — Other Ambulatory Visit: Payer: Self-pay | Admitting: *Deleted

## 2014-02-04 MED ORDER — LINAGLIPTIN 5 MG PO TABS: 5.0000 mg | ORAL_TABLET | Freq: Every day | ORAL | Status: DC

## 2014-02-04 MED ORDER — LOSARTAN POTASSIUM 50 MG PO TABS: 50.0000 mg | ORAL_TABLET | Freq: Every day | ORAL | Status: DC

## 2014-02-04 NOTE — Telephone Encounter (Signed)
Received fax requesting refill on Losartan and Tradjenta.   Refill appropriate and filled per protocol.

## 2014-02-17 ENCOUNTER — Encounter: Payer: Self-pay | Admitting: Family Medicine

## 2014-02-17 MED ORDER — ROFLUMILAST 500 MCG PO TABS: 500.0000 ug | ORAL_TABLET | Freq: Every day | ORAL | Status: DC

## 2014-02-17 MED ORDER — PREGABALIN 150 MG PO CAPS: 150.0000 mg | ORAL_CAPSULE | Freq: Two times a day (BID) | ORAL | Status: DC

## 2014-02-17 NOTE — Telephone Encounter (Signed)
Per MD orders, prescriptions sent to the pharmacy.

## 2014-02-19 MED ORDER — ROFLUMILAST 500 MCG PO TABS: 500.0000 ug | ORAL_TABLET | Freq: Every day | ORAL | Status: DC

## 2014-02-19 MED ORDER — PREGABALIN 150 MG PO CAPS: 150.0000 mg | ORAL_CAPSULE | Freq: Two times a day (BID) | ORAL | Status: DC

## 2014-02-19 NOTE — Addendum Note (Signed)
Addended by: Sheral Flow on: 02/19/2014 09:18 AM   Modules accepted: Orders

## 2014-02-23 ENCOUNTER — Encounter: Payer: Self-pay | Admitting: Family Medicine

## 2014-02-25 ENCOUNTER — Encounter: Payer: Self-pay | Admitting: Family Medicine

## 2014-02-26 ENCOUNTER — Other Ambulatory Visit: Payer: Self-pay | Admitting: Internal Medicine

## 2014-02-26 ENCOUNTER — Other Ambulatory Visit: Payer: Self-pay | Admitting: Family Medicine

## 2014-02-26 NOTE — Telephone Encounter (Signed)
Discussed e-mail with MD.   MD states that patient has some arthritis and some mild bulging disc in back. Patient states that she was unhappy with Dr. Merlene Laughter and requested a new pain doctor. A referral was placed and the new doctor can decline to take a patient if they want to.   There is nothing surgical that needs to be done so neurosurgery will not help.  We are trying to help her but with her history we can not guarantee someone will prescribe her pain pills. The patient has violated a pain contract with [facility].  At this point, the patient can return to Dr. Merlene Laughter for pain management, or we can try a new referral to a pain clinic in [place].   Call placed to patient to discuss. [place].

## 2014-02-26 NOTE — Telephone Encounter (Signed)
Refill appropriate and filled per protocol. 

## 2014-02-26 NOTE — Telephone Encounter (Signed)
Patient returned call and made aware.   States that she has transportation issues and she does not think she will be able to go to [place], but requested referral to new clinic there.   MD to be made aware.

## 2014-02-27 ENCOUNTER — Other Ambulatory Visit: Payer: Self-pay | Admitting: *Deleted

## 2014-02-27 ENCOUNTER — Encounter: Payer: Self-pay | Admitting: Internal Medicine

## 2014-02-27 ENCOUNTER — Ambulatory Visit (INDEPENDENT_AMBULATORY_CARE_PROVIDER_SITE_OTHER): Payer: Medicare HMO | Admitting: Internal Medicine

## 2014-02-27 VITALS — BP 136/84 | HR 123 | Temp 99.1°F | Ht 64.0 in | Wt 300.8 lb

## 2014-02-27 DIAGNOSIS — J441 Chronic obstructive pulmonary disease with (acute) exacerbation: Secondary | ICD-10-CM

## 2014-02-27 DIAGNOSIS — R05 Cough: Secondary | ICD-10-CM

## 2014-02-27 DIAGNOSIS — J449 Chronic obstructive pulmonary disease, unspecified: Secondary | ICD-10-CM

## 2014-02-27 DIAGNOSIS — Z720 Tobacco use: Secondary | ICD-10-CM

## 2014-02-27 DIAGNOSIS — R058 Other specified cough: Secondary | ICD-10-CM

## 2014-02-27 DIAGNOSIS — J4530 Mild persistent asthma, uncomplicated: Secondary | ICD-10-CM

## 2014-02-27 DIAGNOSIS — J302 Other seasonal allergic rhinitis: Secondary | ICD-10-CM

## 2014-02-27 DIAGNOSIS — F17210 Nicotine dependence, cigarettes, uncomplicated: Secondary | ICD-10-CM

## 2014-02-27 MED ORDER — LOSARTAN POTASSIUM 50 MG PO TABS: 50.0000 mg | ORAL_TABLET | Freq: Every day | ORAL | Status: DC

## 2014-02-27 MED ORDER — METHYLPREDNISOLONE ACETATE 80 MG/ML IJ SUSP
120.0000 mg | Freq: Once | INTRAMUSCULAR | Status: AC
  Administered 2014-02-27: 120 mg via INTRAMUSCULAR

## 2014-02-27 MED ORDER — OXYCODONE-ACETAMINOPHEN 10-325 MG PO TABS: ORAL_TABLET | ORAL | Status: DC

## 2014-02-27 NOTE — Progress Notes (Signed)
Subjective:    Patient ID: [name], female    DOB: 26-Apr-1965  MRN: [id number]    Brief patient profile:  28 yowf active smoker with pna as 48 year old but then could keep up with classmates fine at PE but developed recurrent exacerbations of cough starting in 2010 and referred to pulmonary clinic 12/22/2011 by Dr Buelah Manis p 3 admissions to [facility] since Nov 2012 with dx of pna and refractory cough and sob since then.  Spirometry normal 03/18/2012   History of Present Illness  12/22/2011 1st pulmonary eval on ACEI  cc doe x walking outside in heat but does ok indoors, very sedentary, also  bad cough when not taking cough medications coughs so hard she vomits, more productive in am thick yellow, never bloody. Not using nebs x sev weeks.  rec Prilosec Take 30-60 min before first meal of the day and Zantac 150 mg one at bedtime Stop lisinopril and start benicar 20 mg one daily (ok to take one half if too strong) - you should be able to taper off your cough medication soon  Stop advair and spiriva Dulera 100 Take 2 puffs first thing in am and then another 2 puffs about 12 hours later.  Only use your albuterol as a rescue medication  .  Please schedule a follow up office visit in 4 weeks, sooner if needed with pfts and cxr> did not return   03/18/2012 f/u ov/[name] still smoking  cc breathing better on dulera 100 2 bid and much less cough and need for albuterol , only needs hfa, not neb at all.   rec The key is to stop smoking completely before smoking completely stops you!  Change dulera to 100 2 every 12 hours when breathing is bad, not daily You do not require follow up here unless losing ground with your activity tolerance off cigarettes.   Admit Pna Jan 2014 [facility] (see notes)   Admit date: 01/10/2013  Discharge date: 01/20/2013    Recommendations for Outpatient Follow-up:  1. Repeat chest xray in 2-3 weeks to ensure resolution of pneumonia 2. ARB was discontinued due to hypotension  and hypokalemia 3. Follow up with PCP in 2 weeks 4. Follow up with pulmonologist in 2 weeks Discharge Diagnoses:  Principal Problem:  MRSA pneumonia  Active Problems:  HTN (hypertension)  DM type 2 (diabetes mellitus, type 2)  Tobacco abuse  Depression  Morbid obesity  Peripheral neuropathy  Oxygen dependent  Hyperkalemia      02/03/2013 f/u ov/[name] last smoked 01/29/13 but on 02 24h 2.5 on 40 mg pred per day  Chief Complaint  Patient presents with  . Follow-up    Pt states dxed with PNA and MRSA recently and was d/c'ed from [facility] on 01/20/13. She c/o having increased SOB, chest tightness and prod cough with very minimal yellow sputum.   using neb every 4 hours due to cough and sob with min acitvty but not much response rec Plan A Dulera 100 Take 2 puffs first thing in am and then another 2 puffs about 12 hours later  Prilosec 40 mg Take 30-60 min before first meal of the day and Pepcid 20 mg one at bedtime  For breathing  Plan B Only use your albuterol as a rescue medication   Plan C is your nebulizer ok to use nebulizer up to every 4 hours but my hope is you won't need it at all  For cough mucinex dm 1200 mg every 12 hours supplement  with percocet up to every 4 hours if needed plus the flutter valve  GERD  diet    See Tammy NP w/in 2 weeks with all your medications > did not return     Admit date: 01/15/2014 Discharge date: 01/20/2014  Time spent: 45 minutes  Recommendations for Outpatient Follow-up:  Patient will be discharged home. She is to continue using her medications as prescribed. Patient should resume activity as tolerated. Patient was strongly advised to abstain from tobacco use. Patient will need a followup with her primary care physician as well as pulmonologist within one to 2 weeks of discharge. Patient was instructed to continue using her oxygen as well as CPAP machine.   Discharge Diagnoses:  COPD exacerbation Leukocytosis Chest pain Diabetes  mellitus, type II Hypertension Hyperlipidemia Tobacco abuse GERD Chronic pain Depression anxiety Normocytic anemia  Discharge Condition: Stable  Diet recommendation: Heart healthy/carb modified  Filed Weights   01/15/14 1052 01/15/14 1720  Weight: 131.543 kg (290 lb) 134 kg (295 lb 6.7 oz)    History of present illness:  on 01/15/2014  [name] is a 48 y.o. female came to the ED on 01/15/2014 with progressive SOB and cough. Gradual worsening for weeks. Significantly worsened when she went to beach this past weekend. Improved with home breathing treatments but O2 was as low as 85 at home. Used her CPAPbut still felt SOB. Does not wear O2 during the day. Smoking 1ppd. Denied fevers, palpitations. Associated w/ CP which comes on w/ deep breathing.   Hospital Course:  COPD exacerbation  -Patient continues to have cough with diminished breath sounds.  -Possibly secondary to change in weather versus chronic smoking  -Chest x-ray: COPD, possible acute bronchitis  -Repeat chest x-ray on 01/19/2014 showed no acute cardiopulmonary process -Continue oxygen supplementation to maintain saturations above 92%  -Continue Levaquin, steroids, DuoNeb treatments, and guaifenesin with codeine  -Patient is to continue using her flutter valve as well as take her medications as prescribed. She was also advised to stop smoking.  Leukocytosis  -Resolving, Likely reactive secondary to steroids   Chest pain  -Resolved, Likely pleuritic and musculoskeletal  -Worsened with deep inspiration  -Troponins negative x3   Diabetes mellitus, type 2  -Last hemoglobin A1c in March of 2015 was 6.7  -During hospitalization patient was placed on insulin sliding scale with CBG monitoring  -Medications were held however patient may restart upon discharge.  Hypertension  -Continue losartan   Hyperlipidemia  -Continue statin   Tobacco abuse  -Advised to stop smoking and  counseled.  -Continue nicotine patch   GERD  -Continue PPI   Chronic pain  -Continue home medications of robaxin, Lyrica, oxycodone   Depression and anxiety  -Continue her medications   Normocytic anemia  -Hb remains stable, will continue to monitor CBC  Procedures:  None  Consultations:  None   02/27/2014 acute /extended re-establish  ov/Akita Maxim re: AB/ still smoking  Chief Complaint  Patient presents with  . Acute Visit    Pt c/o increased cough- non prod, SOB, wheezing, and chest tighness on and off for the past 2-3 months. Using albuterol neb at least once per day.   day > noct hacking cough non productive    No obvious pattern to  day to day or daytime variabilty or assoc  cp or   subjective wheeze overt sinus or hb symptoms. No unusual exp hx or h/o childhood pna/ asthma or knowledge of premature birth.  Sleeping ok without nocturnal  or  early am exacerbation  of respiratory  c/o's or need for noct saba. Also denies any obvious fluctuation of symptoms with weather or environmental changes or other aggravating or alleviating factors except as outlined above   Current Medications, Allergies, Complete Past Medical History, Past Surgical History, Family History, and Social History were reviewed in [employer] record.  ROS  The following are not active complaints unless bolded sore throat, dysphagia, dental problems, itching, sneezing,  nasal congestion or excess/ purulent secretions, ear ache,   fever, chills, sweats, unintended wt loss, pleuritic or exertional cp, hemoptysis,  orthopnea pnd or leg swelling, presyncope, palpitations, heartburn, abdominal pain, anorexia, nausea, vomiting, diarrhea  or change in bowel or urinary habits, change in stools or urine, dysuria,hematuria,  rash, arthralgias, visual complaints, headache, numbness weakness or ataxia or problems with walking or coordination,  change in mood/affect or memory.                Objective:   Physical Exam amb obese / slt cushingnoid wf nad with minimal  Pseudowheeze/ harsh barking cough / smells of cigs    03/18/2012 wt  265 > 284 02/03/2013 > 02/27/2014  301 Wt Readings from Last 3 Encounters:  12/22/11 259 lb 9.6 oz (117.754 kg)  11/16/11 257 lb (116.574 kg)  11/09/11 252 lb 0.8 oz (114.329 kg)     HEENT: nl dentition, turbinates, and orophanx. Nl external ear canals without cough reflex   NECK :  without JVD/Nodes/TM/ nl carotid upstrokes bilaterally   LUNGS: no acc muscle use, insp and exp rhonchi bilaterally    CV:  RRR  no s3 or murmur or increase in P2, no edema   ABD:  soft and nontender with nl excursion in the supine position. No bruits or organomegaly, bowel sounds nl  MS:  warm without deformities, calf tenderness, cyanosis or clubbing   01/19/14 No acute cardiopulmonary disease.                      Assessment & Plan:

## 2014-02-27 NOTE — Telephone Encounter (Signed)
Received fax requesting refill on Losartan.   Refill appropriate and filled per protocol. 

## 2014-02-27 NOTE — Patient Instructions (Addendum)
Prilosec 40 mg Take 30-60 min before first meal of the day and Pepcid 20 mg one at bedtime  Stop flonase and clariton and dulera   For allergies, For drainage take chlortrimeton (chlorpheniramine) 4 mg every 4 hours available over the counter (may cause drowsiness)    For cough mucinex dm 1200 mg every 12 hours supplement with percocet up to every 4 hours if needed plus the flutter valve as much as possible   For breathing >> Only use your albuterol neb as a rescue medication to be used if you can't catch your breath by resting or doing a relaxed purse lip breathing pattern.  - The less you use it, the better it will work when you need it. - if coughing treat this first and see if breathing not better once stop coughing - Ok to use up to every 4 hours if breathing   GERD (REFLUX)  is an extremely common cause of respiratory symptoms, many times with no significant heartburn at all.    It can be treated with medication, but also with lifestyle changes including avoidance of late meals, excessive alcohol, smoking cessation, and avoid fatty foods, chocolate, peppermint, colas, red wine, and acidic juices such as orange juice.  NO MINT OR MENTHOL PRODUCTS SO NO COUGH DROPS  USE SUGARLESS CANDY INSTEAD (jolley ranchers or Stover's)  NO OIL BASED VITAMINS - use powdered substitutes.    See Tammy NP in 1 week  with all your medications/ flutter valve/ neb solution, even over the counter meds, separated in two separate bags, the ones you take no matter what vs the ones you stop once you feel better and take only as needed when you feel you need them.   Tammy  will generate for you a new user friendly medication calendar that will put [place] all on the same page re: your medication use.     Once you have seen Tammy and we are sure that we're all on the same page with your medication use she will arrange follow up with me with all medications and the med calendar in your hand so we can confirm you have  all the medications you need. Add consider change cozar to bystolic trial x 2 weeks then ov with me

## 2014-02-28 DIAGNOSIS — R05 Cough: Secondary | ICD-10-CM | POA: Insufficient documentation

## 2014-02-28 DIAGNOSIS — R058 Other specified cough: Secondary | ICD-10-CM | POA: Insufficient documentation

## 2014-02-28 NOTE — Assessment & Plan Note (Signed)
Classic Upper airway cough syndrome, so named because it's frequently impossible to sort out how much is  CR/sinusitis with freq throat clearing (which can be related to primary GERD)   vs  causing  secondary (" extra esophageal")  GERD from wide swings in gastric pressure that occur with throat clearing, often  promoting self use of mint and menthol lozenges that reduce the lower esophageal sphincter tone and exacerbate the problem further in a cyclical fashion.   These are the same pts (now being labeled as having "irritable larynx syndrome" by some cough centers) who not infrequently have a history of having failed to tolerate ace inhibitors,  dry powder inhalers or biphosphonates or report having atypical reflux symptoms that don't respond to standard doses of PPI , and are easily confused as having aecopd or asthma flares by even experienced allergists/ pulmonologists.   Clearly has a chronic component of uacs ? Related to losartan For reasons that may related to vascular permability and nitric oxide pathways but not elevated  bradykinin levels (as seen with  ACEi use) losartan in the generic form has been reported now from mulitple sources  to cause a similar pattern of non-specific  upper airway symptoms as seen with acei.   This has not been reported with exposure to the other ARB's to date, so it seems reasonable for now to try either generic diovan or avapro if ARB needed or use an alternative class altogether.  See:  Lelon Frohlich Allergy Asthma Immunol  2008: 101: p [phone]   Will consider trial of bystolic at next ov  For now add flutter and h1 at hs / max gerd rx and regroup in one week

## 2014-02-28 NOTE — Assessment & Plan Note (Signed)
>   3 m  I took an extended  opportunity with this patient to outline the consequences of continued cigarette use  in airway disorders based on all the data we have from the multiple national lung health studies (perfomed over decades at millions of dollars in cost)  indicating that smoking cessation, not choice of inhalers or physicians, is the most important aspect of care.   

## 2014-02-28 NOTE — Assessment & Plan Note (Signed)
No evidence of copd to date/ so it's not too late to quit smoking

## 2014-02-28 NOTE — Assessment & Plan Note (Addendum)
Symptoms are markedly disproportionate to objective findings and not clear this is a lung problem but pt does appear to have difficult airway management issues. DDX of  difficult airways management all start with A and  include Adherence, Ace Inhibitors, Acid Reflux, Active Sinus Disease, Alpha 1 Antitripsin deficiency, Anxiety masquerading as Airways dz,  ABPA,  allergy(esp in young), Aspiration (esp in elderly), Adverse effects of DPI,  Active smokers, plus two Bs  = Bronchiectasis and Beta blocker use..and one C= CHF  Adherence is always the initial "prime suspect" and is a multilayered concern that requires a "trust but verify" approach in every patient - starting with knowing how to use medications, especially inhalers, correctly, keeping up with refills and understanding the fundamental difference between maintenance and prns vs those medications only taken for a very short course and then stopped and not refilled.  - thoroughly confused with meds >  To keep things simple, I have asked the patient to first separate medicines that are perceived as maintenance, that is to be taken daily "no matter what", from those medicines that are taken on only on an as-needed basis and I have given the patient examples of both, and then return to see our NP to generate a  detailed  medication calendar which should be followed until the next physician sees the patient and updates it.  - The proper method of use, as well as anticipated side effects, of a metered-dose inhaler are discussed and demonstrated to the patient. Improved effectiveness after extensive coaching during this visit to a level of approximately  75% but severe cough p used correctly so for now rec just use the neb  ? Acid (or non-acid) GERD > always difficult to exclude as up to 75% of pts in some series report no assoc GI/ Heartburn symptoms> rec max (24h)  acid suppression and diet restrictions/ reviewed and instructions given in writing.   ? ACEi/  arb effect > consider trial of bystolic p we do med rec  Active smoking greatest concern > discussed separately   ? Allergy > doubt and would avoid further chronic steroids but to control her symptoms acutely > depomedrol 120 IM

## 2014-03-05 ENCOUNTER — Ambulatory Visit (INDEPENDENT_AMBULATORY_CARE_PROVIDER_SITE_OTHER): Payer: Medicare HMO | Admitting: Adult Health

## 2014-03-05 ENCOUNTER — Encounter: Payer: Self-pay | Admitting: Family Medicine

## 2014-03-05 ENCOUNTER — Encounter: Payer: Self-pay | Admitting: Adult Health

## 2014-03-05 VITALS — BP 106/62 | HR 93 | Temp 97.0°F | Ht 64.0 in | Wt 293.2 lb

## 2014-03-05 DIAGNOSIS — J4530 Mild persistent asthma, uncomplicated: Secondary | ICD-10-CM

## 2014-03-05 NOTE — Assessment & Plan Note (Addendum)
Slow to resolve flare with upper airway cough and ongoing smoking  Patient's medications were reviewed today and patient education was given. Computerized medication calendar was adjusted/completed  Needs PFT once cough under better control  Control for cough triggers.  Would use caution with narcotic refills as pt has chronic pain syndrome with back issues   Plan  Follow med calendar closely and bring to each visit.  Follow up Dr. Melvyn Novas  As planned and As needed   Bring formulary to next office visit.

## 2014-03-05 NOTE — Progress Notes (Signed)
Subjective:    Patient ID: [name], female    DOB: Jul 07, 1965  MRN: [id number]    Brief patient profile:  38 yowf active smoker with pna as 48 year old but then could keep up with classmates fine at PE but developed recurrent exacerbations of cough starting in 2010 and referred to pulmonary clinic 12/22/2011 by Dr Buelah Manis p 3 admissions to [facility] since Nov 2012 with dx of pna and refractory cough and sob since then.  Spirometry normal 03/18/2012   History of Present Illness  12/22/2011 1st pulmonary eval on ACEI  cc doe x walking outside in heat but does ok indoors, very sedentary, also  bad cough when not taking cough medications coughs so hard she vomits, more productive in am thick yellow, never bloody. Not using nebs x sev weeks.  rec Prilosec Take 30-60 min before first meal of the day and Zantac 150 mg one at bedtime Stop lisinopril and start benicar 20 mg one daily (ok to take one half if too strong) - you should be able to taper off your cough medication soon  Stop advair and spiriva Dulera 100 Take 2 puffs first thing in am and then another 2 puffs about 12 hours later.  Only use your albuterol as a rescue medication  .  Please schedule a follow up office visit in 4 weeks, sooner if needed with pfts and cxr> did not return   03/18/2012 f/u ov/Wert still smoking  cc breathing better on dulera 100 2 bid and much less cough and need for albuterol , only needs hfa, not neb at all.   rec The key is to stop smoking completely before smoking completely stops you!  Change dulera to 100 2 every 12 hours when breathing is bad, not daily You do not require follow up here unless losing ground with your activity tolerance off cigarettes.   Admit Pna Jan 2014 [facility] (see notes)   Admit date: 01/10/2013  Discharge date: 01/20/2013    Recommendations for Outpatient Follow-up:  1. Repeat chest xray in 2-3 weeks to ensure resolution of pneumonia 2. ARB was discontinued due to hypotension  and hypokalemia 3. Follow up with PCP in 2 weeks 4. Follow up with pulmonologist in 2 weeks Discharge Diagnoses:  Principal Problem:  MRSA pneumonia  Active Problems:  HTN (hypertension)  DM type 2 (diabetes mellitus, type 2)  Tobacco abuse  Depression  Morbid obesity  Peripheral neuropathy  Oxygen dependent  Hyperkalemia      02/03/2013 f/u ov/Wert last smoked 01/29/13 but on 02 24h 2.5 on 40 mg pred per day  Chief Complaint  Patient presents with  . Follow-up    Pt states dxed with PNA and MRSA recently and was d/c'ed from [facility] on 01/20/13. She c/o having increased SOB, chest tightness and prod cough with very minimal yellow sputum.   using neb every 4 hours due to cough and sob with min acitvty but not much response rec Plan A Dulera 100 Take 2 puffs first thing in am and then another 2 puffs about 12 hours later  Prilosec 40 mg Take 30-60 min before first meal of the day and Pepcid 20 mg one at bedtime  For breathing  Plan B Only use your albuterol as a rescue medication   Plan C is your nebulizer ok to use nebulizer up to every 4 hours but my hope is you won't need it at all  For cough mucinex dm 1200 mg every 12 hours supplement  with percocet up to every 4 hours if needed plus the flutter valve  GERD  diet    See Sheila Hollingworth NP w/in 2 weeks with all your medications > did not return     Admit date: 01/15/2014 Discharge date: 01/20/2014  Time spent: 45 minutes  Recommendations for Outpatient Follow-up:  Patient will be discharged home. She is to continue using her medications as prescribed. Patient should resume activity as tolerated. Patient was strongly advised to abstain from tobacco use. Patient will need a followup with her primary care physician as well as pulmonologist within one to 2 weeks of discharge. Patient was instructed to continue using her oxygen as well as CPAP machine.   Discharge Diagnoses:  COPD exacerbation Leukocytosis Chest pain Diabetes  mellitus, type II Hypertension Hyperlipidemia Tobacco abuse GERD Chronic pain Depression anxiety Normocytic anemia  Discharge Condition: Stable  Diet recommendation: Heart healthy/carb modified  Filed Weights   01/15/14 1052 01/15/14 1720  Weight: 131.543 kg (290 lb) 134 kg (295 lb 6.7 oz)    History of present illness:  on 01/15/2014  [name] is a 48 y.o. female came to the ED on 01/15/2014 with progressive SOB and cough. Gradual worsening for weeks. Significantly worsened when she went to beach this past weekend. Improved with home breathing treatments but O2 was as low as 85 at home. Used her CPAPbut still felt SOB. Does not wear O2 during the day. Smoking 1ppd. Denied fevers, palpitations. Associated w/ CP which comes on w/ deep breathing.   Hospital Course:  COPD exacerbation  -Patient continues to have cough with diminished breath sounds.  -Possibly secondary to change in weather versus chronic smoking  -Chest x-ray: COPD, possible acute bronchitis  -Repeat chest x-ray on 01/19/2014 showed no acute cardiopulmonary process -Continue oxygen supplementation to maintain saturations above 92%  -Continue Levaquin, steroids, DuoNeb treatments, and guaifenesin with codeine  -Patient is to continue using her flutter valve as well as take her medications as prescribed. She was also advised to stop smoking.  Leukocytosis  -Resolving, Likely reactive secondary to steroids   Chest pain  -Resolved, Likely pleuritic and musculoskeletal  -Worsened with deep inspiration  -Troponins negative x3   Diabetes mellitus, type 2  -Last hemoglobin A1c in March of 2015 was 6.7  -During hospitalization patient was placed on insulin sliding scale with CBG monitoring  -Medications were held however patient may restart upon discharge.  Hypertension  -Continue losartan   Hyperlipidemia  -Continue statin   Tobacco abuse  -Advised to stop smoking and  counseled.  -Continue nicotine patch   GERD  -Continue PPI   Chronic pain  -Continue home medications of robaxin, Lyrica, oxycodone   Depression and anxiety  -Continue her medications   Normocytic anemia  -Hb remains stable, will continue to monitor CBC  Procedures:  None  Consultations:  None   02/27/2014 acute /extended re-establish  ov/Wert re: AB/ still smoking  Chief Complaint  Patient presents with  . Acute Visit    Pt c/o increased cough- non prod, SOB, wheezing, and chest tighness on and off for the past 2-3 months. Using albuterol neb at least once per day.   day > noct hacking cough non productive   >stop dulera , claritin  03/05/2014 Follow up and Medication Review  AB/ still smoking  Returns for 2 week follow up .  We reviewed all her medications organize them into a medication count with patient education. Patient has a recent change of her insurance and  several medications are not currently being covered.  She has an office visit with her primary care physician next week and plans to discuss this. Last visit. Patient was started on aggressive treatment for cough, reflux and postnasal drip. Last visit was instructed to stop her Dulera and claritin.  She is using Percocet for cough control She says that her cough is much improved She did not start Chlor-Trimeton as she still not find this at the store. She denies any hemoptysis, chest pain, orthopnea, PND, or leg swelling She has cut down slightly on cigarettes, however, is also using vapor cigarettes    Current Medications, Allergies, Complete Past Medical History, Past Surgical History, Family History, and Social History were reviewed in [employer] record.  ROS  The following are not active complaints unless bolded sore throat, dysphagia, dental problems, itching, sneezing,  nasal congestion or excess/ purulent secretions, ear ache,   fever, chills, sweats, unintended wt  loss, pleuritic or exertional cp, hemoptysis,  orthopnea pnd or leg swelling, presyncope, palpitations, heartburn, abdominal pain, anorexia, nausea, vomiting, diarrhea  or change in bowel or urinary habits, change in stools or urine, dysuria,hematuria,  rash, arthralgias, visual complaints, headache, numbness weakness or ataxia or problems with walking or coordination,  change in mood/affect or memory.               Objective:   Physical Exam amb obese / slt cushingnoid wf nad    03/18/2012 wt  265 > 284 02/03/2013 > 02/27/2014  301> 293 03/05/2014     HEENT: nl dentition, turbinates, and orophanx. Nl external ear canals without cough reflex   NECK :  without JVD/Nodes/TM/ nl carotid upstrokes bilaterally   LUNGS: no acc muscle use,  Faint psuedowheeze on forced exp    CV:  RRR  no s3 or murmur or increase in P2, no edema   ABD:  soft and nontender with nl excursion in the supine position. No bruits or organomegaly, bowel sounds nl  MS:  warm without deformities, calf tenderness, cyanosis or clubbing   01/19/14 No acute cardiopulmonary disease.                      Assessment & Plan:

## 2014-03-05 NOTE — Patient Instructions (Signed)
Follow med calendar closely and bring to each visit.  follow up Dr. Melvyn Novas  As planned and As needed   Bring formulary to next office visit.

## 2014-03-09 ENCOUNTER — Ambulatory Visit (INDEPENDENT_AMBULATORY_CARE_PROVIDER_SITE_OTHER): Payer: Medicare HMO | Admitting: Psychiatry

## 2014-03-09 ENCOUNTER — Encounter (HOSPITAL_COMMUNITY): Payer: Self-pay | Admitting: Psychiatry

## 2014-03-09 VITALS — BP 146/79 | HR 83 | Ht 64.0 in | Wt 292.4 lb

## 2014-03-09 DIAGNOSIS — F1919 Other psychoactive substance abuse with unspecified psychoactive substance-induced disorder: Secondary | ICD-10-CM

## 2014-03-09 DIAGNOSIS — F331 Major depressive disorder, recurrent, moderate: Secondary | ICD-10-CM

## 2014-03-09 DIAGNOSIS — F411 Generalized anxiety disorder: Secondary | ICD-10-CM

## 2014-03-09 MED ORDER — PANTOPRAZOLE SODIUM 40 MG PO TBEC: 40.0000 mg | DELAYED_RELEASE_TABLET | Freq: Every day | ORAL | Status: DC

## 2014-03-09 MED ORDER — METHYLPHENIDATE HCL 20 MG PO TABS: 20.0000 mg | ORAL_TABLET | Freq: Two times a day (BID) | ORAL | Status: DC

## 2014-03-09 MED ORDER — SITAGLIPTIN PHOSPHATE 100 MG PO TABS: 100.0000 mg | ORAL_TABLET | Freq: Every day | ORAL | Status: DC

## 2014-03-09 MED ORDER — DULOXETINE HCL 60 MG PO CPEP: 60.0000 mg | ORAL_CAPSULE | Freq: Every day | ORAL | Status: DC

## 2014-03-09 MED ORDER — ALPRAZOLAM 0.5 MG PO TABS
0.5000 mg | ORAL_TABLET | Freq: Three times a day (TID) | ORAL | Status: DC | PRN
Start: 2014-03-09 — End: 2014-06-01
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
## 2014-03-13 ENCOUNTER — Encounter: Payer: Self-pay | Admitting: Internal Medicine

## 2014-03-13 ENCOUNTER — Ambulatory Visit (INDEPENDENT_AMBULATORY_CARE_PROVIDER_SITE_OTHER): Payer: Medicare HMO | Admitting: Internal Medicine

## 2014-03-13 VITALS — BP 122/74 | HR 114 | Ht 64.0 in | Wt 295.0 lb

## 2014-03-13 DIAGNOSIS — R05 Cough: Secondary | ICD-10-CM

## 2014-03-13 DIAGNOSIS — R058 Other specified cough: Secondary | ICD-10-CM

## 2014-03-13 DIAGNOSIS — I10 Essential (primary) hypertension: Secondary | ICD-10-CM

## 2014-03-13 DIAGNOSIS — F17210 Nicotine dependence, cigarettes, uncomplicated: Secondary | ICD-10-CM

## 2014-03-13 DIAGNOSIS — J4530 Mild persistent asthma, uncomplicated: Secondary | ICD-10-CM

## 2014-03-13 DIAGNOSIS — J9611 Chronic respiratory failure with hypoxia: Secondary | ICD-10-CM

## 2014-03-13 DIAGNOSIS — J45901 Unspecified asthma with (acute) exacerbation: Secondary | ICD-10-CM

## 2014-03-13 NOTE — Addendum Note (Signed)
Addended by: Parke Poisson E on: 03/13/2014 01:56 PM   Modules accepted: Orders, Medications

## 2014-03-13 NOTE — Progress Notes (Signed)
Subjective:    Patient ID: [name], female    DOB: [birth year]  MRN: [id number]    Brief patient profile:  63 yowf active smoker with pna as 48 year old but then could keep up with classmates fine at PE but developed recurrent exacerbations of cough starting in 2010 and referred to pulmonary clinic 12/22/2011 by Dr Buelah Manis p 3 admissions to [facility] since Nov 2012 with dx of pna and refractory cough and sob since then.  Spirometry normal 03/18/2012   History of Present Illness  12/22/2011 1st pulmonary eval on ACEI  cc doe x walking outside in heat but does ok indoors, very sedentary, also  bad cough when not taking cough medications coughs so hard she vomits, more productive in am thick yellow, never bloody. Not using nebs x sev weeks.  rec Prilosec Take 30-60 min before first meal of the day and Zantac 150 mg one at bedtime Stop lisinopril and start benicar 20 mg one daily (ok to take one half if too strong) - you should be able to taper off your cough medication soon  Stop advair and spiriva Dulera 100 Take 2 puffs first thing in am and then another 2 puffs about 12 hours later.  Only use your albuterol as a rescue medication  .  Please schedule a follow up office visit in 4 weeks, sooner if needed with pfts and cxr> did not return   03/18/2012 f/u ov/[name] still smoking  cc breathing better on dulera 100 2 bid and much less cough and need for albuterol , only needs hfa, not neb at all.   rec The key is to stop smoking completely before smoking completely stops you!  Change dulera to 100 2 every 12 hours when breathing is bad, not daily You do not require follow up here unless losing ground with your activity tolerance off cigarettes.   Admit Pna Jan 2014 [facility] (see notes)   Admit date: 01/10/2013  Discharge date: 01/20/2013    Recommendations for Outpatient Follow-up:  1. Repeat chest xray in 2-3 weeks to ensure resolution of pneumonia 2. ARB was discontinued due to hypotension  and hypokalemia 3. Follow up with PCP in 2 weeks 4. Follow up with pulmonologist in 2 weeks Discharge Diagnoses:  Principal Problem:  MRSA pneumonia  Active Problems:  HTN (hypertension)  DM type 2 (diabetes mellitus, type 2)  Tobacco abuse  Depression  Morbid obesity  Peripheral neuropathy  Oxygen dependent  Hyperkalemia      02/03/2013 f/u ov/[name] last smoked 01/29/13 but on 02 24h 2.5 on 40 mg pred per day  Chief Complaint  Patient presents with  . Follow-up    Pt states dxed with PNA and MRSA recently and was d/c'ed from [facility] on 01/20/13. She c/o having increased SOB, chest tightness and prod cough with very minimal yellow sputum.   using neb every 4 hours due to cough and sob with min acitvty but not much response rec Plan A Dulera 100 Take 2 puffs first thing in am and then another 2 puffs about 12 hours later  Prilosec 40 mg Take 30-60 min before first meal of the day and Pepcid 20 mg one at bedtime  For breathing  Plan B Only use your albuterol as a rescue medication   Plan C is your nebulizer ok to use nebulizer up to every 4 hours but my hope is you won't need it at all  For cough mucinex dm 1200 mg every 12 hours supplement  with percocet up to every 4 hours if needed plus the flutter valve  GERD  diet    See Tammy NP w/in 2 weeks with all your medications > did not return     Admit date: 01/15/2014 Discharge date: 01/20/2014     Recommendations for Outpatient Follow-up:  Patient will be discharged home. She is to continue using her medications as prescribed. Patient should resume activity as tolerated. Patient was strongly advised to abstain from tobacco use. Patient will need a followup with her primary care physician as well as pulmonologist within one to 2 weeks of discharge. Patient was instructed to continue using her oxygen as well as CPAP machine.   Discharge Diagnoses:  COPD exacerbation Leukocytosis Chest pain Diabetes mellitus, type  II Hypertension Hyperlipidemia Tobacco abuse GERD Chronic pain Depression anxiety Normocytic anemia  Discharge Condition: Stable  Diet recommendation: Heart healthy/carb modified  Filed Weights   01/15/14 1052 01/15/14 1720  Weight: 131.543 kg (290 lb) 134 kg (295 lb 6.7 oz)    History of present illness:  on 01/15/2014  [name] is a 48 y.o. female came to the ED on 01/15/2014 with progressive SOB and cough. Gradual worsening for weeks. Significantly worsened when she went to beach this past weekend. Improved with home breathing treatments but O2 was as low as 85 at home. Used her CPAPbut still felt SOB. Does not wear O2 during the day. Smoking 1ppd. Denied fevers, palpitations. Associated w/ CP which comes on w/ deep breathing.   Hospital Course:  COPD exacerbation  -Patient continues to have cough with diminished breath sounds.  -Possibly secondary to change in weather versus chronic smoking  -Chest x-ray: COPD, possible acute bronchitis  -Repeat chest x-ray on 01/19/2014 showed no acute cardiopulmonary process -Continue oxygen supplementation to maintain saturations above 92%  -Continue Levaquin, steroids, DuoNeb treatments, and guaifenesin with codeine  -Patient is to continue using her flutter valve as well as take her medications as prescribed. She was also advised to stop smoking.  Leukocytosis  -Resolving, Likely reactive secondary to steroids   Chest pain  -Resolved, Likely pleuritic and musculoskeletal  -Worsened with deep inspiration  -Troponins negative x3   Diabetes mellitus, type 2  -Last hemoglobin A1c in March of 2015 was 6.7  -During hospitalization patient was placed on insulin sliding scale with CBG monitoring  -Medications were held however patient may restart upon discharge.  Hypertension  -Continue losartan   Hyperlipidemia  -Continue statin   Tobacco abuse  -Advised to stop smoking and counseled.   -Continue nicotine patch   GERD  -Continue PPI   Chronic pain  -Continue home medications of robaxin, Lyrica, oxycodone   Depression and anxiety  -Continue her medications   Normocytic anemia  -Hb remains stable, will continue to monitor CBC     02/27/2014 acute /extended re-establish  ov/[name] re: AB/ still smoking  Chief Complaint  Patient presents with  . Acute Visit    Pt c/o increased cough- non prod, SOB, wheezing, and chest tighness on and off for the past 2-3 months. Using albuterol neb at least once per day.   day > noct hacking cough non productive   >Prilosec 40 mg Take 30-60 min before first meal of the day and Pepcid 20 mg one at bedtime Stop flonase and clariton and dulera  For allergies, For drainage take chlortrimeton (chlorpheniramine) 4 mg every 4 hours available over the counter (may cause drowsiness)   For cough mucinex dm 1200 mg every 12  hours supplement with percocet up to every 4 hours if needed plus the flutter valve as much as possible  For breathing >> Only use your albuterol neb as a rescue medication  GERD  Diet       03/05/2014 Follow up and Medication Review  AB/ still smoking  Returns for 2 week follow up .  We reviewed all her medications organize them into a medication count with patient education. Patient has a recent change of her insurance and several medications are not currently being covered.  She has an office visit with her primary care physician next week and plans to discuss this. Last visit. Patient was started on aggressive treatment for cough, reflux and postnasal drip. Last visit was instructed to stop her Dulera and claritin.  She is using Percocet for cough control She says that her cough is much improved She did not start Chlor-Trimeton as she still not find this at the store. Follow med calendar and bring to each ov   03/13/2014 f/u ov/[name] re: still smoking  With  severe cough x 3 y/ no med calendar, changes  made by Dr Harrington Challenger in [place] / no flutter on hand  Chief Complaint  Patient presents with  . Follow-up    Pt states that her breathing is slightly better "still get winded doing anything".  Has not had to use the neb at all since last visit. Cough is unchanged since last visit.    harsh dry barking day > noct not using flutter, no worse since ran out of opioids   No obvious day to day or daytime variabilty or assoc  or chest tightness, subjective wheeze overt sinus or hb symptoms. No unusual exp hx or h/o childhood pna/ asthma or knowledge of premature birth.  Sleeping ok without nocturnal  or early am exacerbation  of respiratory  c/o's or need for noct saba. Also denies any obvious fluctuation of symptoms with weather or environmental changes or other aggravating or alleviating factors except as outlined above   Current Medications, Allergies, Complete Past Medical History, Past Surgical History, Family History, and Social History were reviewed in [employer] record.  ROS  The following are not active complaints unless bolded sore throat, dysphagia, dental problems, itching, sneezing,  nasal congestion or excess/ purulent secretions, ear ache,   fever, chills, sweats, unintended wt loss, pleuritic or exertional cp, hemoptysis,  orthopnea pnd or leg swelling, presyncope, palpitations, heartburn, abdominal pain, anorexia, nausea, vomiting, diarrhea  or change in bowel or urinary habits, change in stools or urine, dysuria,hematuria,  rash, arthralgias/ was on pain contract, visual complaints, headache, numbness weakness or ataxia or problems with walking or coordination,  change in mood/affect or memory.                         Objective:   Physical Exam amb obese / slt cushingnoid wf nad until start her usual barking /honking still upper airway coughing fits/ barking resolves with flutter use    03/18/2012 wt  265 > 284 02/03/2013 > 02/27/2014  301> 293  03/05/2014 > 03/13/2014  295     HEENT: nl dentition, turbinates, and orophanx. Nl external ear canals without cough reflex   NECK :  without JVD/Nodes/TM/ nl carotid upstrokes bilaterally   LUNGS: no acc muscle use, clear to  A and P    CV:  RRR  no s3 or murmur or increase in P2, no edema   ABD:  soft and nontender with nl excursion in the supine position. No bruits or organomegaly, bowel sounds nl  MS:  warm without deformities, calf tenderness, cyanosis or clubbing   01/19/14 No acute cardiopulmonary disease.                      Assessment & Plan:

## 2014-03-13 NOTE — Patient Instructions (Addendum)
Stop losartan  Start bystolic 5 mg one daily   For drainage take chlortrimeton (chlorpheniramine) 4 mg every 4 hours available over the counter (may cause drowsiness)   For cough use the delsym 2 tsp every 12 hours and the flutter valve as much as possible  Please schedule a follow up office visit in 2  weeks, sooner if needed with cxr on return if still coughing

## 2014-03-14 NOTE — Assessment & Plan Note (Addendum)
The standardized cough guidelines published in Chest by Lissa Morales in 2006 are still the best available and consist of a multiple step process (up to 12!) , not a single office visit,  and are intended  to address this problem logically,  with an alogrithm dependent on response to empiric treatment at  each progressive step  to determine a specific diagnosis with  minimal addtional testing needed. Therefore if adherence is an issue or can't be accurately verified,  it's very unlikely the standard evaluation and treatment will be successful here.    Furthermore, response to therapy (other than acute cough suppression, which should only be used short term with avoidance of narcotic containing cough syrups if possible), can be a gradual process for which the patient may perceive immediate benefit.  Unlike going to an eye doctor where the best perscription is almost always the first one and is immediately effective, this is almost never the case in the management of chronic cough syndromes. Therefore the patient needs to commit up front to consistently adhere to recommendations  for up to 6 weeks of therapy directed at the likely underlying problem(s) before the response can be reasonably evaluated.   She did not use flutter or mucinex dm or 1st gen h1 as rec - will also try off arb (see hbp)    Each maintenance medication was reviewed in detail including most importantly the difference between maintenance and as needed and under what circumstances the prns are to be used. This was done in the context of a medication calendar review which provided the patient with a user-friendly unambiguous mechanism for medication administration and reconciliation and provides an action plan for all active problems. It is critical that this be shown to every doctor  for modification during the office visit if necessary so the patient can use it as a working document.

## 2014-03-14 NOTE — Assessment & Plan Note (Addendum)
-   PFT's 03/18/2012 > spirometry nl   - HFA 75% p coaching 02/27/14 -med calendar 03/05/2014 > 03/13/14 did not return with it/ made changes since 03/05/14   Not clear that she really has much asthma though she is at risk of bronchitis from smoking and so far has not truly committed to quit (discussed separately )  For now avoid further steroids esp inhaled which can make cough, her main concern, worse.    Each maintenance medication was reviewed in detail including most importantly the difference between maintenance and as needed and under what circumstances the prns are to be used.  Please see instructions for details which were reviewed in writing and the patient given a copy.    The proper method of use, as well as anticipated side effects, of a metered-dose inhaler are discussed and demonstrated to the patient. Improved effectiveness after extensive coaching during this visit to a level of approximately  90%

## 2014-03-14 NOTE — Assessment & Plan Note (Signed)

## 2014-03-14 NOTE — Assessment & Plan Note (Signed)
D/c acei 12/22/2011 due to psuedowheeze and narcotic dependent cough> ? Improved  For reasons that may related to vascular permability and nitric oxide pathways but not elevated  bradykinin levels (as seen with  ACEi use) losartan in the generic form has been reported now from mulitple sources  to cause a similar pattern of non-specific  upper airway symptoms as seen with acei.   This has not been reported with exposure to the other ARB's to date, so it seems reasonable for now to try either generic diovan or avapro if ARB needed or use an alternative class altogether.  See:  Lelon Frohlich Allergy Asthma Immunol  2008: 101: p [phone]    Therefore try off cozaar and start bystolic 5 mg daily

## 2014-03-25 ENCOUNTER — Encounter (HOSPITAL_COMMUNITY): Payer: Self-pay | Admitting: *Deleted

## 2014-03-25 ENCOUNTER — Inpatient Hospital Stay (HOSPITAL_COMMUNITY)
Admission: EM | Admit: 2014-03-25 | Discharge: 2014-03-31 | DRG: 190 | Disposition: A | Discharge status: Home / Self Care | Payer: Medicare HMO | Attending: Internal Medicine | Admitting: Internal Medicine

## 2014-03-25 ENCOUNTER — Emergency Department (HOSPITAL_COMMUNITY): Payer: Medicare HMO

## 2014-03-25 DIAGNOSIS — R0600 Dyspnea, unspecified: Secondary | ICD-10-CM

## 2014-03-25 DIAGNOSIS — J9621 Acute and chronic respiratory failure with hypoxia: Secondary | ICD-10-CM | POA: Diagnosis present

## 2014-03-25 DIAGNOSIS — Z794 Long term (current) use of insulin: Secondary | ICD-10-CM

## 2014-03-25 DIAGNOSIS — K219 Gastro-esophageal reflux disease without esophagitis: Secondary | ICD-10-CM | POA: Diagnosis present

## 2014-03-25 DIAGNOSIS — G4730 Sleep apnea, unspecified: Secondary | ICD-10-CM | POA: Diagnosis present

## 2014-03-25 DIAGNOSIS — F419 Anxiety disorder, unspecified: Secondary | ICD-10-CM | POA: Diagnosis present

## 2014-03-25 DIAGNOSIS — R0603 Acute respiratory distress: Secondary | ICD-10-CM | POA: Diagnosis present

## 2014-03-25 DIAGNOSIS — E119 Type 2 diabetes mellitus without complications: Secondary | ICD-10-CM | POA: Diagnosis present

## 2014-03-25 DIAGNOSIS — I10 Essential (primary) hypertension: Secondary | ICD-10-CM | POA: Diagnosis present

## 2014-03-25 DIAGNOSIS — D72829 Elevated white blood cell count, unspecified: Secondary | ICD-10-CM | POA: Diagnosis present

## 2014-03-25 DIAGNOSIS — Z9981 Dependence on supplemental oxygen: Secondary | ICD-10-CM | POA: Diagnosis not present

## 2014-03-25 DIAGNOSIS — E1159 Type 2 diabetes mellitus with other circulatory complications: Secondary | ICD-10-CM | POA: Diagnosis present

## 2014-03-25 DIAGNOSIS — R0602 Shortness of breath: Secondary | ICD-10-CM

## 2014-03-25 DIAGNOSIS — E1140 Type 2 diabetes mellitus with diabetic neuropathy, unspecified: Secondary | ICD-10-CM | POA: Diagnosis present

## 2014-03-25 DIAGNOSIS — F32A Depression, unspecified: Secondary | ICD-10-CM | POA: Diagnosis present

## 2014-03-25 DIAGNOSIS — I959 Hypotension, unspecified: Secondary | ICD-10-CM | POA: Diagnosis present

## 2014-03-25 DIAGNOSIS — M1990 Unspecified osteoarthritis, unspecified site: Secondary | ICD-10-CM | POA: Diagnosis present

## 2014-03-25 DIAGNOSIS — D509 Iron deficiency anemia, unspecified: Secondary | ICD-10-CM | POA: Diagnosis present

## 2014-03-25 DIAGNOSIS — E1149 Type 2 diabetes mellitus with other diabetic neurological complication: Secondary | ICD-10-CM

## 2014-03-25 DIAGNOSIS — F329 Major depressive disorder, single episode, unspecified: Secondary | ICD-10-CM | POA: Diagnosis present

## 2014-03-25 DIAGNOSIS — R071 Chest pain on breathing: Secondary | ICD-10-CM

## 2014-03-25 DIAGNOSIS — Z720 Tobacco use: Secondary | ICD-10-CM

## 2014-03-25 DIAGNOSIS — J441 Chronic obstructive pulmonary disease with (acute) exacerbation: Principal | ICD-10-CM | POA: Diagnosis present

## 2014-03-25 DIAGNOSIS — Z79899 Other long term (current) drug therapy: Secondary | ICD-10-CM | POA: Diagnosis not present

## 2014-03-25 DIAGNOSIS — E662 Morbid (severe) obesity with alveolar hypoventilation: Secondary | ICD-10-CM | POA: Diagnosis present

## 2014-03-25 DIAGNOSIS — E1142 Type 2 diabetes mellitus with diabetic polyneuropathy: Secondary | ICD-10-CM | POA: Diagnosis present

## 2014-03-25 DIAGNOSIS — F17210 Nicotine dependence, cigarettes, uncomplicated: Secondary | ICD-10-CM | POA: Diagnosis present

## 2014-03-25 DIAGNOSIS — Z6841 Body Mass Index (BMI) 40.0 and over, adult: Secondary | ICD-10-CM | POA: Diagnosis not present

## 2014-03-25 HISTORY — DX: Chest pain on breathing: R07.1

## 2014-03-25 LAB — CBC WITH DIFFERENTIAL/PLATELET
Basophils Absolute: 0 10*3/uL (ref 0.0–0.1)
Basophils Relative: 0 % (ref 0–1)
Eosinophils Absolute: 0.1 10*3/uL (ref 0.0–0.7)
Eosinophils Relative: 1 % (ref 0–5)
HCT: 34.8 % — ABNORMAL LOW (ref 36.0–46.0)
Hemoglobin: 10.3 g/dL — ABNORMAL LOW (ref 12.0–15.0)
Lymphocytes Relative: 11 % — ABNORMAL LOW (ref 12–46)
Lymphs Abs: 1.8 10*3/uL (ref 0.7–4.0)
MCH: 20.7 pg — ABNORMAL LOW (ref 26.0–34.0)
MCHC: 29.6 g/dL — ABNORMAL LOW (ref 30.0–36.0)
MCV: 70 fL — ABNORMAL LOW (ref 78.0–100.0)
Monocytes Absolute: 0.9 10*3/uL (ref 0.1–1.0)
Monocytes Relative: 5 % (ref 3–12)
Neutro Abs: 13.8 10*3/uL — ABNORMAL HIGH (ref 1.7–7.7)
Neutrophils Relative %: 83 % — ABNORMAL HIGH (ref 43–77)
Platelets: 284 10*3/uL (ref 150–400)
RBC: 4.97 MIL/uL (ref 3.87–5.11)
RDW: 18.9 % — ABNORMAL HIGH (ref 11.5–15.5)
WBC: 16.7 10*3/uL — ABNORMAL HIGH (ref 4.0–10.5)

## 2014-03-25 LAB — COMPREHENSIVE METABOLIC PANEL
ALT: 14 U/L (ref 0–35)
AST: 16 U/L (ref 0–37)
Albumin: 3.6 g/dL (ref 3.5–5.2)
Alkaline Phosphatase: 81 U/L (ref 39–117)
Anion gap: 15 (ref 5–15)
BUN: 7 mg/dL (ref 6–23)
CO2: 27 mEq/L (ref 19–32)
Calcium: 9.3 mg/dL (ref 8.4–10.5)
Chloride: 92 mEq/L — ABNORMAL LOW (ref 96–112)
Creatinine, Ser: 0.75 mg/dL (ref 0.50–1.10)
GFR calc Af Amer: >90 mL/min (ref >90)
GFR calc non Af Amer: >90 mL/min (ref >90)
Glucose, Bld: 141 mg/dL — ABNORMAL HIGH (ref 70–99)
Potassium: 3.9 mEq/L (ref 3.7–5.3)
Sodium: 134 mEq/L — ABNORMAL LOW (ref 137–147)
Total Bilirubin: 0.8 mg/dL (ref 0.3–1.2)
Total Protein: 7.7 g/dL (ref 6.0–8.3)

## 2014-03-25 LAB — BLOOD GAS, ARTERIAL
Acid-base deficit: 0.7 mmol/L (ref 0.0–2.0)
Allens test (pass/fail): POSITIVE — AB
Bicarbonate: 23.3 mEq/L (ref 20.0–24.0)
O2 Content: 4.5 L/min
O2 Saturation: 88.5 %
Patient temperature: 37
TCO2: 21.8 mmol/L (ref 0–100)
pCO2 arterial: 37.4 mmHg (ref 35.0–45.0)
pH, Arterial: 7.411 (ref 7.350–7.450)
pO2, Arterial: 59.6 mmHg — ABNORMAL LOW (ref 80.0–100.0)

## 2014-03-25 LAB — TROPONIN I: Troponin I: <0.3 ng/mL (ref <0.30)

## 2014-03-25 LAB — GLUCOSE, CAPILLARY: Glucose-Capillary: 202 mg/dL — ABNORMAL HIGH (ref 70–99)

## 2014-03-25 LAB — D-DIMER, QUANTITATIVE: D-Dimer, Quant: 0.45 ug/mL-FEU (ref 0.00–0.48)

## 2014-03-25 LAB — PRO B NATRIURETIC PEPTIDE: Pro B Natriuretic peptide (BNP): 562.3 pg/mL — ABNORMAL HIGH (ref 0–125)

## 2014-03-25 MED ORDER — GI COCKTAIL ~~LOC~~: 30.0000 mL | Freq: Three times a day (TID) | ORAL | Status: DC | PRN

## 2014-03-25 MED ORDER — ALBUTEROL (5 MG/ML) CONTINUOUS INHALATION SOLN
10.0000 mg/h | INHALATION_SOLUTION | Freq: Once | RESPIRATORY_TRACT | Status: AC
  Administered 2014-03-25: 10 mg/h via RESPIRATORY_TRACT

## 2014-03-25 MED ORDER — DULOXETINE HCL 60 MG PO CPEP
60.0000 mg | ORAL_CAPSULE | Freq: Every day | ORAL | Status: DC
  Administered 2014-03-26 – 2014-03-31 (×6): 60 mg via ORAL
  Filled 2014-03-25 (×7): qty 1

## 2014-03-25 MED ORDER — SODIUM CHLORIDE 0.9 % IV SOLN: INTRAVENOUS | Status: DC

## 2014-03-25 MED ORDER — ALPRAZOLAM 0.5 MG PO TABS
0.5000 mg | ORAL_TABLET | Freq: Three times a day (TID) | ORAL | Status: DC | PRN
  Administered 2014-03-25 – 2014-03-30 (×9): 0.5 mg via ORAL
  Filled 2014-03-25 (×9): qty 1

## 2014-03-25 MED ORDER — IPRATROPIUM-ALBUTEROL 0.5-2.5 (3) MG/3ML IN SOLN: 3.0000 mL | RESPIRATORY_TRACT | Status: DC | PRN

## 2014-03-25 MED ORDER — KETOROLAC TROMETHAMINE 30 MG/ML IJ SOLN
30.0000 mg | Freq: Once | INTRAMUSCULAR | Status: AC
  Administered 2014-03-25: 30 mg via INTRAVENOUS
  Filled 2014-03-25: qty 1

## 2014-03-25 MED ORDER — MORPHINE SULFATE 2 MG/ML IJ SOLN
2.0000 mg | INTRAMUSCULAR | Status: DC | PRN
  Administered 2014-03-25 – 2014-03-26 (×5): 2 mg via INTRAVENOUS
  Filled 2014-03-25 (×5): qty 1

## 2014-03-25 MED ORDER — NICOTINE 14 MG/24HR TD PT24
14.0000 mg | MEDICATED_PATCH | Freq: Every day | TRANSDERMAL | Status: DC
  Administered 2014-03-25 – 2014-03-31 (×7): 14 mg via TRANSDERMAL
  Filled 2014-03-25 (×7): qty 1

## 2014-03-25 MED ORDER — SODIUM CHLORIDE 0.9 % IJ SOLN
3.0000 mL | Freq: Two times a day (BID) | INTRAMUSCULAR | Status: DC
  Administered 2014-03-25 – 2014-03-31 (×11): 3 mL via INTRAVENOUS

## 2014-03-25 MED ORDER — DOXYCYCLINE HYCLATE 100 MG PO TABS
100.0000 mg | ORAL_TABLET | Freq: Two times a day (BID) | ORAL | Status: DC
  Administered 2014-03-25 – 2014-03-31 (×12): 100 mg via ORAL
  Filled 2014-03-25 (×12): qty 1

## 2014-03-25 MED ORDER — CYCLOBENZAPRINE HCL 10 MG PO TABS
10.0000 mg | ORAL_TABLET | Freq: Once | ORAL | Status: AC
  Administered 2014-03-25: 10 mg via ORAL
  Filled 2014-03-25: qty 1

## 2014-03-25 MED ORDER — IPRATROPIUM-ALBUTEROL 0.5-2.5 (3) MG/3ML IN SOLN
3.0000 mL | Freq: Once | RESPIRATORY_TRACT | Status: AC
  Administered 2014-03-25: 3 mL via RESPIRATORY_TRACT
  Filled 2014-03-25: qty 3

## 2014-03-25 MED ORDER — DEXTROSE 5 % IV SOLN
500.0000 mg | Freq: Once | INTRAVENOUS | Status: AC
  Administered 2014-03-25: 500 mg via INTRAVENOUS
  Filled 2014-03-25: qty 500

## 2014-03-25 MED ORDER — HEPARIN SODIUM (PORCINE) 5000 UNIT/ML IJ SOLN
5000.0000 [IU] | Freq: Three times a day (TID) | INTRAMUSCULAR | Status: DC
  Administered 2014-03-25 – 2014-03-31 (×18): 5000 [IU] via SUBCUTANEOUS
  Filled 2014-03-25 (×16): qty 1

## 2014-03-25 MED ORDER — POLYETHYLENE GLYCOL 3350 17 G PO PACK: 17.0000 g | PACK | Freq: Every day | ORAL | Status: DC | PRN

## 2014-03-25 MED ORDER — IPRATROPIUM-ALBUTEROL 0.5-2.5 (3) MG/3ML IN SOLN: 3.0000 mL | RESPIRATORY_TRACT | Status: DC

## 2014-03-25 MED ORDER — FERROUS SULFATE 325 (65 FE) MG PO TABS
325.0000 mg | ORAL_TABLET | Freq: Two times a day (BID) | ORAL | Status: DC
  Administered 2014-03-26 – 2014-03-31 (×11): 325 mg via ORAL
  Filled 2014-03-25 (×11): qty 1

## 2014-03-25 MED ORDER — ALBUTEROL SULFATE (2.5 MG/3ML) 0.083% IN NEBU
2.5000 mg | INHALATION_SOLUTION | RESPIRATORY_TRACT | Status: AC
  Administered 2014-03-25 – 2014-03-26 (×3): 2.5 mg via RESPIRATORY_TRACT
  Filled 2014-03-25 (×3): qty 3

## 2014-03-25 MED ORDER — ATORVASTATIN CALCIUM 10 MG PO TABS
10.0000 mg | ORAL_TABLET | Freq: Every day | ORAL | Status: DC
  Administered 2014-03-25 – 2014-03-30 (×6): 10 mg via ORAL
  Filled 2014-03-25 (×8): qty 1

## 2014-03-25 MED ORDER — METHYLPREDNISOLONE SODIUM SUCC 125 MG IJ SOLR
60.0000 mg | Freq: Four times a day (QID) | INTRAMUSCULAR | Status: DC
Start: 2014-03-26 — End: 2014-03-28
  Administered 2014-03-25 – 2014-03-28 (×11): 60 mg via INTRAVENOUS
  Filled 2014-03-25 (×11): qty 2

## 2014-03-25 MED ORDER — SENNA 8.6 MG PO TABS
1.0000 | ORAL_TABLET | Freq: Two times a day (BID) | ORAL | Status: DC
  Administered 2014-03-25 – 2014-03-31 (×12): 8.6 mg via ORAL
  Filled 2014-03-25 (×16): qty 1

## 2014-03-25 MED ORDER — METOCLOPRAMIDE HCL 10 MG PO TABS: 5.0000 mg | ORAL_TABLET | Freq: Four times a day (QID) | ORAL | Status: DC | PRN

## 2014-03-25 MED ORDER — ACETAMINOPHEN 650 MG RE SUPP: 650.0000 mg | Freq: Four times a day (QID) | RECTAL | Status: DC | PRN

## 2014-03-25 MED ORDER — SODIUM CHLORIDE 0.9 % IV SOLN
INTRAVENOUS | Status: DC
  Administered 2014-03-25 – 2014-03-26 (×4): via INTRAVENOUS

## 2014-03-25 MED ORDER — METHYLPREDNISOLONE SODIUM SUCC 125 MG IJ SOLR
125.0000 mg | Freq: Once | INTRAMUSCULAR | Status: AC
  Administered 2014-03-25: 125 mg via INTRAVENOUS
  Filled 2014-03-25: qty 2

## 2014-03-25 MED ORDER — ONDANSETRON HCL 4 MG/2ML IJ SOLN: 4.0000 mg | Freq: Three times a day (TID) | INTRAMUSCULAR | Status: DC | PRN

## 2014-03-25 MED ORDER — ALBUTEROL (5 MG/ML) CONTINUOUS INHALATION SOLN
INHALATION_SOLUTION | RESPIRATORY_TRACT | Status: AC
  Administered 2014-03-25: 10 mg/h via RESPIRATORY_TRACT
  Filled 2014-03-25: qty 20

## 2014-03-25 MED ORDER — ROFLUMILAST 500 MCG PO TABS
500.0000 ug | ORAL_TABLET | Freq: Every day | ORAL | Status: DC
  Administered 2014-03-26 – 2014-03-31 (×6): 500 ug via ORAL
  Filled 2014-03-25 (×9): qty 1

## 2014-03-25 MED ORDER — PREGABALIN 75 MG PO CAPS
150.0000 mg | ORAL_CAPSULE | Freq: Two times a day (BID) | ORAL | Status: DC
  Administered 2014-03-25 – 2014-03-31 (×12): 150 mg via ORAL
  Filled 2014-03-25 (×12): qty 2

## 2014-03-25 MED ORDER — ACETAMINOPHEN 325 MG PO TABS: 650.0000 mg | ORAL_TABLET | Freq: Four times a day (QID) | ORAL | Status: DC | PRN

## 2014-03-25 MED ORDER — PANTOPRAZOLE SODIUM 40 MG PO TBEC
40.0000 mg | DELAYED_RELEASE_TABLET | Freq: Every day | ORAL | Status: DC
  Administered 2014-03-26 – 2014-03-27 (×2): 40 mg via ORAL
  Filled 2014-03-25 (×3): qty 1

## 2014-03-25 MED ORDER — INSULIN ASPART 100 UNIT/ML ~~LOC~~ SOLN
0.0000 [IU] | Freq: Three times a day (TID) | SUBCUTANEOUS | Status: DC
  Administered 2014-03-26 (×3): 3 [IU] via SUBCUTANEOUS

## 2014-03-25 MED ORDER — METHYLPHENIDATE HCL 5 MG PO TABS
20.0000 mg | ORAL_TABLET | Freq: Two times a day (BID) | ORAL | Status: DC
  Administered 2014-03-26 – 2014-03-31 (×12): 20 mg via ORAL
  Filled 2014-03-25 (×12): qty 4

## 2014-03-25 NOTE — ED Notes (Signed)
Pt reports increasing SOb x2 days, has HX of COPD, O2 sats 84% RA. Pt placed on 3L [place].

## 2014-03-25 NOTE — ED Notes (Signed)
MD at bedside. 

## 2014-03-25 NOTE — H&P (Addendum)
[facility] History and Physical  ESTER HILLEY UUV:[id number] DOB: [birth year] DOA: 03/25/2014  Referring physician: Dr Vanita Panda - [facility] PCP: Vic Blackbird, MD   Chief Complaint: Cough  HPI: Ysidro Evert is a 48 y.o. female  Cough and SOB started 3 days ago. Becoming progressively worse. Productive. Associated w/ CP which started today associated w/ breathing and coughing. Also associated w/ sinus congestion anorexia and nausea. Denies fevers, diarrhea.  Cut back to 1/2 ppd of cigarettes.  Albuterol 3 treatments yesterday, one overnight, 3 today prior to coming to the ED. Only mild relief.  Came off Dulera 4 weeks ago, per Dr. Melvyn Novas. .    Review of Systems:  Constitutional:  No weight loss, night sweats, Fevers, chills, fatigue.  HEENT:  Per HPI  Cardio-vascular:  No  Orthopnea, PND, swelling in lower extremities, anasarca, dizziness, palpitations  GI:  No heartburn, indigestion, abdominal pain, nausea, vomiting, diarrhea, change in bowel habits,  Resp:  Per HPI Skin:  no rash or lesions.  GU:  no dysuria, change in color of urine, no urgency or frequency. No flank pain.  Musculoskeletal:  No joint pain or swelling. No decreased range of motion. No back pain.  Psych:  No change in mood or affect. No depression or anxiety. No memory loss.   Past Medical History  Diagnosis Date  . Diabetes mellitus   . Asthmatic bronchitis     normal PFT/ seen by pulmonary no evidence of COPD  . HTN (hypertension)   . Low back pain   . Tachycardia     never had test done since no insurance  . Depression   . Shortness of breath   . Anxiety   . Gastric erosions     EGD 08/2010.  . Internal hemorrhoids     Colonoscopy 5/12.  [name] Heavy menses   . Chronic respiratory failure with hypoxia     On 2-3 L of oxygen at home  . GERD (gastroesophageal reflux disease)   . Anemia   . Arthritis   . Sleep apnea   . COPD (chronic obstructive pulmonary disease)    Past Surgical  History  Procedure Laterality Date  . Cholecystectomy  1990  . Cesarean section      twice  . Kidney surgery      as child for blockages  . Tubal ligation    . Tonsillectomy    . Wrist surgery  1995    Lt wrist  . Tympanostomy tube placement    . Hysteroscopy with thermachoice  01/17/2012    Procedure: HYSTEROSCOPY WITH THERMACHOICE;  Surgeon: Florian Buff, MD;  Location: [facility];  Service: Gynecology;  Laterality: N/A;  total therapy time: 9:13sec  D5W  18 ml in, D5W   50ml out, temperture 87degrees celcious  . Uterine ablation     Social History:  reports that she has been smoking Cigarettes.  She has a 15 pack-year smoking history. She has never used smokeless tobacco. She reports that she drinks alcohol. She reports that she uses illicit drugs (Marijuana).  Allergies  Allergen Reactions  . Codeine Itching and Nausea Only  . Wellbutrin [Bupropion Hcl] Hives    Family History  Problem Relation Age of Onset  . Heart attack Father 78    deceased, etoh use  . Heart disease Father   . Alcohol abuse Father   . Depression Father   . Heart attack Mother 3    deceased  . Diabetes Mother   . Breast  cancer Mother   . Heart failure Mother     oxygen dependence, nonsmoker  . Heart disease Mother   . Depression Mother   . Cancer Mother   . Colon cancer Neg Hx   . Liver disease Maternal Aunt 49    died while on liver transplant list  . Heart attack Maternal Grandmother     premature CAD  . Ulcers Sister   . Hypertension Sister      Prior to Admission medications   Medication Sig Start Date End Date Taking? Authorizing Provider  albuterol (PROVENTIL HFA) 108 (90 BASE) MCG/ACT inhaler Inhale 2 puffs into the lungs every 4 (four) hours as needed for wheezing or shortness of breath.   Yes Historical Provider, MD  albuterol (PROVENTIL) (2.5 MG/3ML) 0.083% nebulizer solution Take 3 mLs (2.5 mg total) by nebulization every 6 (six) hours as needed for wheezing. Patient taking  differently: Take 2.5 mg by nebulization every 4 (four) hours as needed for wheezing.  02/03/13  Yes Tanda Rockers, MD  ALPRAZolam Duanne Moron) 0.5 MG tablet Take 1 tablet (0.5 mg total) by mouth 3 (three) times daily as needed for sleep or anxiety. 03/09/14 03/09/15 Yes [name], MD  atorvastatin (LIPITOR) 10 MG tablet Take 10 mg by mouth at bedtime.   Yes Historical Provider, MD  chlorpheniramine (CHLOR-TRIMETON) 4 MG tablet Take 4 mg by mouth every 4 (four) hours as needed for allergies.   Yes Historical Provider, MD  DULoxetine (CYMBALTA) 60 MG capsule Take 1 capsule (60 mg total) by mouth daily. 03/09/14 03/09/15 Yes [name], MD  famotidine (PEPCID) 20 MG tablet Take 20 mg by mouth at bedtime.   Yes Historical Provider, MD  losartan (COZAAR) 50 MG tablet Take 1 tablet (50 mg total) by mouth daily. 02/27/14  Yes Alycia Rossetti, MD  methylphenidate (RITALIN) 20 MG tablet Take 1 tablet (20 mg total) by mouth 2 (two) times daily with breakfast and lunch. 03/09/14  Yes [name], MD  metoCLOPramide (REGLAN) 5 MG tablet Take 5 mg by mouth every 6 (six) hours as needed for nausea.   Yes Historical Provider, MD  ofloxacin (FLOXIN) 0.3 % otic solution Place 5 drops into both ears daily as needed (ear pain).    Yes Historical Provider, MD  pantoprazole (PROTONIX) 40 MG tablet Take 1 tablet (40 mg total) by mouth daily. 03/09/14  Yes Alycia Rossetti, MD  pregabalin (LYRICA) 150 MG capsule Take 1 capsule (150 mg total) by mouth 2 (two) times daily. 02/19/14  Yes Alycia Rossetti, MD  roflumilast (DALIRESP) 500 MCG TABS tablet Take 1 tablet (500 mcg total) by mouth daily. 02/19/14  Yes Alycia Rossetti, MD  sitaGLIPtin (JANUVIA) 100 MG tablet Take 1 tablet (100 mg total) by mouth daily. 03/09/14  Yes Alycia Rossetti, MD  atorvastatin (LIPITOR) 10 MG tablet TAKE ONE TABLET BY MOUTH ONCE DAILY Patient not taking: Reported on 03/25/2014 02/26/14   Alycia Rossetti, MD  ibuprofen (ADVIL,MOTRIN) 200 MG  tablet Take 200 mg by mouth every 6 (six) hours as needed (pain). Per bottle as needed for joint pain    Historical Provider, MD  loperamide (IMODIUM A-D) 2 MG tablet Take 2 mg by mouth 3 (three) times daily as needed for diarrhea or loose stools.     Historical Provider, MD  oxyCODONE-acetaminophen (PERCOCET) 10-325 MG per tablet 1-2 every 4 hours for pain or cough as needed Patient not taking: Reported on 03/25/2014 02/27/14   Tanda Rockers,  MD   Physical Exam: Filed Vitals:   03/25/14 1630 03/25/14 1702 03/25/14 1730 03/25/14 1800  BP: 106/57 100/50 101/53 116/43  Pulse: 89 96 96 101  Temp:      TempSrc:      Resp: 28 22 21 24   Height:      Weight:      SpO2: 95% 95% 92% 90%    Wt Readings from Last 3 Encounters:  03/25/14 131.543 kg (290 lb)  03/13/14 133.811 kg (295 lb)  03/09/14 132.632 kg (292 lb 6.4 oz)    General: mild distress Eyes: EOMI PERRL, normal lids, irises & conjunctiva ENT:  grossly normal hearing, lips & tongue Neck:  no LAD, masses or thyromegaly Cardiovascular:  RRR, no m/r/g. No LE edema. Telemetry:  SR, no arrhythmias  Respiratory: Wheezing and mild course breath sounds throughout all lung fields. Good air movement. On 4L [place]. Increased effort Abdomen:  soft, ntnd Skin:  no rash or induration seen on limited exam Musculoskeletal:  grossly normal tone BUE/BLE Psychiatric:  grossly normal mood and affect, speech fluent and appropriate Neurologic:  grossly non-focal.          Labs on Admission:  Basic Metabolic Panel:  Recent Labs Lab 03/25/14 1539  NA 134*  K 3.9  CL 92*  CO2 27  GLUCOSE 141*  BUN 7  CREATININE 0.75  CALCIUM 9.3   Liver Function Tests:  Recent Labs Lab 03/25/14 1539  AST 16  ALT 14  ALKPHOS 81  BILITOT 0.8  PROT 7.7  ALBUMIN 3.6   No results for input(s): LIPASE, AMYLASE in the last 168 hours. No results for input(s): AMMONIA in the last 168 hours. CBC:  Recent Labs Lab 03/25/14 1539  WBC 16.7*  NEUTROABS  13.8*  HGB 10.3*  HCT 34.8*  MCV 70.0*  PLT 284   Cardiac Enzymes: No results for input(s): CKTOTAL, CKMB, CKMBINDEX, TROPONINI in the last 168 hours.  BNP (last 3 results)  Recent Labs  10/18/13 1722 03/25/14 1539  PROBNP 264.4* 562.3*   CBG: No results for input(s): GLUCAP in the last 168 hours.  Radiological Exams on Admission: Dg Chest Portable 1 View  03/25/2014   CLINICAL DATA:  Shortness of breath.  EXAM: PORTABLE CHEST - 1 VIEW  COMPARISON:  PA and lateral chest 01/19/2014.  CT chest 01/10/2013.  FINDINGS: There is cardiomegaly without edema. Lungs are clear. No pneumothorax or pleural effusion.  IMPRESSION: Cardiomegaly without acute disease.   Electronically Signed   By: Inge Rise M.D.   On: 03/25/2014 16:06    EKG: Independently reviewed. NSR, non-specific T wave changes. No change from previous   Assessment/Plan Principal Problem:   COPD exacerbation Active Problems:   Microcytic anemia   GERD (gastroesophageal reflux disease)   HTN (hypertension)   DM (diabetes mellitus) type II controlled, neurological manifestation   Cigarette smoker   Depression   Morbid obesity   Respiratory distress   Chest pain on respiration   Leukocytosis   COPD Exacerbation: likely started after a sinus infection and PND. Pt taken off DUlera several weeks ago w/o only albuterol at home. Solumedrol 125 in ED and CAT x1. Azithro in Ed. CXR w/o acute process. WBC 16.7 likely from stress. Pt wears CPAP at night - OSA/obesity hypoventilation.  - Admit to hospitalist services - ABG - O2 PRN [place]/CPAP/.... - Solumedrol 60 Q6 - DUonebs Q4/Q2PRN - Doxycycline - continue home daliresp - NS 137ml/hr  HTN: hypotensive on presentation. Likely from taking meds  and w/ poor fluid intake - hold home losartan  Chest pain: pleuritic in nature, likely from COPD. +/- PE (smoker and sedentary for several days prior to acute onset). EKG w/o sign of ACS. ProBnp slightly elevated to 562  likely from demand from COPD exacerbation. Toradol w/o relief.  - Troponin - EKG - Morphine 2mg  Q4 PRN (low dose due to concern for respiratory depression).  - d-dimer - CTA if d-dimer + - Gi cocktail  Microcytic Anemia: Hgb 10.3, baseline 10.5-11. MCV 70. Chronic condition for pt.  - Iron - Anemia panel in am  GERD:  - continue home protonix  DM II w/ neuropathy: on Januvia at home. A1c 6.9 on 01/17/11 - Continue lyrica for neuropathic pain - SSI - hold home Januvia  Depression/Anxiety:  - continue home xanax, cymbalta  Tobacco use: 1/2ppd - nicotine patch  Code Status: FULL (must indicate code status--if unknown or must be presumed, indicate so) DVT Prophylaxis: Hep Family Communication: none Disposition Plan: pending improvement  MERRELL, [facility] [email or web address] Password [email or web address]

## 2014-03-25 NOTE — ED Notes (Signed)
Report given to Lovena Le, RN for room 338.

## 2014-03-25 NOTE — ED Provider Notes (Signed)
CSN: [id number]     Arrival date & time 03/25/14  1531 History  This chart was scribed for Carmin Muskrat, MD by Tula Nakayama, ED Scribe. This patient was seen in room APA14/APA14 and the patient's care was started at 3:45 PM.    Chief Complaint  Patient presents with  . Shortness of Breath   The history is provided by the patient. No language interpreter was used.    HPI Comments: [name] is a 48 y.o. female who presents to the Emergency Department complaining of gradually worsening, intermittent cough that started 3 days ago. Pt states SOB, CP and decreased appetite as associated symptoms. Pt smokes cigarettes, but notes that she has been reducing habit lately. Pt history of persistent, chronic cough for which she sees Dr. Melvyn Novas. She denies prior diagnosis of COPD. She also denies abdominal pain, nasuea, vomiting and fever as associated symptoms.  Past Medical History  Diagnosis Date  . Diabetes mellitus   . Asthmatic bronchitis     normal PFT/ seen by pulmonary no evidence of COPD  . HTN (hypertension)   . Low back pain   . Tachycardia     never had test done since no insurance  . Depression   . Shortness of breath   . Anxiety   . Gastric erosions     EGD 08/2010.  . Internal hemorrhoids     Colonoscopy 5/12.  [name] Heavy menses   . Chronic respiratory failure with hypoxia     On 2-3 L of oxygen at home  . GERD (gastroesophageal reflux disease)   . Anemia   . Arthritis   . Sleep apnea   . COPD (chronic obstructive pulmonary disease)    Past Surgical History  Procedure Laterality Date  . Cholecystectomy  1990  . Cesarean section      twice  . Kidney surgery      as child for blockages  . Tubal ligation    . Tonsillectomy    . Wrist surgery  1995    Lt wrist  . Tympanostomy tube placement    . Hysteroscopy with thermachoice  01/17/2012    Procedure: HYSTEROSCOPY WITH THERMACHOICE;  Surgeon: Florian Buff, MD;  Location: [facility];  Service: Gynecology;   Laterality: N/A;  total therapy time: 9:13sec  D5W  18 ml in, D5W   13ml out, temperture 87degrees celcious  . Uterine ablation     Family History  Problem Relation Age of Onset  . Heart attack Father 63    deceased, etoh use  . Heart disease Father   . Alcohol abuse Father   . Depression Father   . Heart attack Mother 20    deceased  . Diabetes Mother   . Breast cancer Mother   . Heart failure Mother     oxygen dependence, nonsmoker  . Heart disease Mother   . Depression Mother   . Cancer Mother   . Colon cancer Neg Hx   . Liver disease Maternal Aunt 61    died while on liver transplant list  . Heart attack Maternal Grandmother     premature CAD  . Ulcers Sister   . Hypertension Sister    History  Substance Use Topics  . Smoking status: Current Every Day Smoker -- 1.00 packs/day for 30 years    Types: Cigarettes  . Smokeless tobacco: Never Used     Comment: using the vapor cigarettes  . Alcohol Use: 0.0 oz/week    0 Not  specified per week     Comment: social use   OB History    Gravida Para Term Preterm AB TAB SAB Ectopic Multiple Living   2 2 2       2      Review of Systems  Constitutional: Negative for fever.       Per HPI, otherwise negative  HENT:       Per HPI, otherwise negative  Respiratory: Positive for cough and shortness of breath.        Per HPI, otherwise negative  Cardiovascular: Positive for chest pain.       Per HPI, otherwise negative  Gastrointestinal: Negative for nausea, vomiting and abdominal pain.  Endocrine:       Negative aside from HPI  Genitourinary:       Neg aside from HPI   Musculoskeletal:       Per HPI, otherwise negative  Skin: Negative.   Neurological: Negative for syncope.  All other systems reviewed and are negative.  Allergies  Codeine and Wellbutrin  Home Medications   Prior to Admission medications   Medication Sig Start Date End Date Taking? Authorizing Provider  albuterol (PROVENTIL HFA) 108 (90 BASE) MCG/ACT  inhaler Inhale 2 puffs into the lungs every 4 (four) hours as needed for wheezing or shortness of breath.   Yes Historical Provider, MD  albuterol (PROVENTIL) (2.5 MG/3ML) 0.083% nebulizer solution Take 3 mLs (2.5 mg total) by nebulization every 6 (six) hours as needed for wheezing. Patient taking differently: Take 2.5 mg by nebulization every 4 (four) hours as needed for wheezing.  02/03/13  Yes Tanda Rockers, MD  ALPRAZolam Duanne Moron) 0.5 MG tablet Take 1 tablet (0.5 mg total) by mouth 3 (three) times daily as needed for sleep or anxiety. 03/09/14 03/09/15 Yes [name], MD  atorvastatin (LIPITOR) 10 MG tablet Take 10 mg by mouth at bedtime.   Yes Historical Provider, MD  chlorpheniramine (CHLOR-TRIMETON) 4 MG tablet Take 4 mg by mouth every 4 (four) hours as needed for allergies.   Yes Historical Provider, MD  DULoxetine (CYMBALTA) 60 MG capsule Take 1 capsule (60 mg total) by mouth daily. 03/09/14 03/09/15 Yes [name], MD  famotidine (PEPCID) 20 MG tablet Take 20 mg by mouth at bedtime.   Yes Historical Provider, MD  losartan (COZAAR) 50 MG tablet Take 1 tablet (50 mg total) by mouth daily. 02/27/14  Yes Alycia Rossetti, MD  methylphenidate (RITALIN) 20 MG tablet Take 1 tablet (20 mg total) by mouth 2 (two) times daily with breakfast and lunch. 03/09/14  Yes [name], MD  metoCLOPramide (REGLAN) 5 MG tablet Take 5 mg by mouth every 6 (six) hours as needed for nausea.   Yes Historical Provider, MD  ofloxacin (FLOXIN) 0.3 % otic solution Place 5 drops into both ears daily as needed (ear pain).    Yes Historical Provider, MD  pantoprazole (PROTONIX) 40 MG tablet Take 1 tablet (40 mg total) by mouth daily. 03/09/14  Yes Alycia Rossetti, MD  pregabalin (LYRICA) 150 MG capsule Take 1 capsule (150 mg total) by mouth 2 (two) times daily. 02/19/14  Yes Alycia Rossetti, MD  roflumilast (DALIRESP) 500 MCG TABS tablet Take 1 tablet (500 mcg total) by mouth daily. 02/19/14  Yes Alycia Rossetti, MD   sitaGLIPtin (JANUVIA) 100 MG tablet Take 1 tablet (100 mg total) by mouth daily. 03/09/14  Yes Alycia Rossetti, MD  atorvastatin (LIPITOR) 10 MG tablet TAKE ONE TABLET BY MOUTH ONCE DAILY  Patient not taking: Reported on 03/25/2014 02/26/14   Alycia Rossetti, MD  ibuprofen (ADVIL,MOTRIN) 200 MG tablet Take 200 mg by mouth every 6 (six) hours as needed (pain). Per bottle as needed for joint pain    Historical Provider, MD  loperamide (IMODIUM A-D) 2 MG tablet Take 2 mg by mouth 3 (three) times daily as needed for diarrhea or loose stools.     Historical Provider, MD  oxyCODONE-acetaminophen (PERCOCET) 10-325 MG per tablet 1-2 every 4 hours for pain or cough as needed Patient not taking: Reported on 03/25/2014 02/27/14   Tanda Rockers, MD   BP 101/53 mmHg  Pulse 96  Temp(Src) 98.5 F (36.9 C) (Oral)  Resp 21  Ht 5\' 4"  (1.626 m)  Wt 290 lb (131.543 kg)  BMI 49.75 kg/m2  SpO2 92% Physical Exam  Constitutional: She is oriented to person, place, and time. She appears well-developed and well-nourished. She appears distressed.  Obese, generally unwell appearing female with labored respirations  HENT:  Head: Normocephalic and atraumatic.  Eyes: Conjunctivae and EOM are normal.  Cardiovascular: Regular rhythm.  Tachycardia present.   Pulmonary/Chest: No stridor. She is in respiratory distress. She has wheezes.  Coarse breath sounds. Tachypnea. Decreased breath sounds.   Abdominal: She exhibits no distension.  Musculoskeletal: She exhibits no edema.  Neurological: She is alert and oriented to person, place, and time. No cranial nerve deficit.  Skin: Skin is warm. She is diaphoretic.  Psychiatric: She has a normal mood and affect.  Nursing note and vitals reviewed.   ED Course  Procedures (including critical care time) COORDINATION OF CARE: 3:52 PM Discussed treatment plan which includes chest x-ray, lab work, and nebulizer treatment. Pt agreed to plan.   Cardiac: 110 - st, abn O2- 97% w  face mask, abnormal   We discussed smoking cessation, and the importance of this relative to today's illness.  I reviewed the patient's clinic notes, including multiple visits with her pulmonologist. Patient has had difficulty with smoking cessation.  She has refractory cough, chronic respiratory issues.   Labs Review Labs Reviewed  CBC WITH DIFFERENTIAL - Abnormal; Notable for the following:    WBC 16.7 (*)    Hemoglobin 10.3 (*)    HCT 34.8 (*)    MCV 70.0 (*)    MCH 20.7 (*)    MCHC 29.6 (*)    RDW 18.9 (*)    Neutrophils Relative % 83 (*)    Neutro Abs 13.8 (*)    Lymphocytes Relative 11 (*)    All other components within normal limits  COMPREHENSIVE METABOLIC PANEL - Abnormal; Notable for the following:    Sodium 134 (*)    Chloride 92 (*)    Glucose, Bld 141 (*)    All other components within normal limits  PRO B NATRIURETIC PEPTIDE - Abnormal; Notable for the following:    Pro B Natriuretic peptide (BNP) 562.3 (*)    All other components within normal limits    Imaging Review Dg Chest Portable 1 View  03/25/2014   CLINICAL DATA:  Shortness of breath.  EXAM: PORTABLE CHEST - 1 VIEW  COMPARISON:  PA and lateral chest 01/19/2014.  CT chest 01/10/2013.  FINDINGS: There is cardiomegaly without edema. Lungs are clear. No pneumothorax or pleural effusion.  IMPRESSION: Cardiomegaly without acute disease.   Electronically Signed   By: Inge Rise M.D.   On: 03/25/2014 16:06     EKG Interpretation   Date/Time:  Wednesday March 25 2014 15:40:19  EST Ventricular Rate:  102 PR Interval:  116 QRS Duration: 88 QT Interval:  359 QTC Calculation: 468 R Axis:   33 Text Interpretation:  Sinus tachycardia Borderline low voltage, extremity  leads Nonspecific T abnormalities, lateral leads Sinus tachycardia  Artifact Abnormal ekg Confirmed by Carmin Muskrat  MD [phone]) on  03/25/2014 5:49:46 PM     Following initial nebulizer treatment patient appears in similar  condition, tachypneic, tachycardic, dyspneic, with coarse breath sounds.    Update: After a second albuterol treatment session the patient has not changed appreciably.   5:50 PM Patient has received 2 independent treatment sessions, as well as a continuous, one hour nebulizer session, with no substantial change in her condition, heart rate remains 100s, pulse ox symmetry is 91% with 4 L via nasal cannula, this is abnormal She remains tachypneic.  MDM   Final diagnoses:  SOB (shortness of breath)   respiratory distress   I personally performed the services described in this documentation, which was scribed in my presence. The recorded information has been reviewed and is accurate.  This obese female chronic smoker, now presents in respiratory distress with tachypnea, tachycardia, coarse breath sounds. Patient did not have substantial improvement in spite of multiple albuterol sessions, steroids, fluids. Patient does have history of chronic respiratory illness, continues to smoke. Patient's labs are consistent for her, abnormal, but not notably different. No evidence for pneumonia on x-ray, though the patient does have some pulmonary congestion In addition to proper dilator, steroids, patient received a single dose of Lasix. Given the persistent respiratory distress, patient was admitted for further evaluation, management, every 4 hour albuterol sessions.  As discussed, your evaluation today has been largely reassuring.  But, it is important that you monitor your condition carefully, and do not hesitate to return to the ED if you develop new, or concerning changes in your condition.  CRITICAL CARE Performed by: Carmin Muskrat Total critical care time: 35 Critical care time was exclusive of separately billable procedures and treating other patients. Critical care was necessary to treat or prevent imminent or life-threatening deterioration. Critical care was time spent personally by  me on the following activities: development of treatment plan with patient and/or surrogate as well as nursing, discussions with consultants, evaluation of patient's response to treatment, examination of patient, obtaining history from patient or surrogate, ordering and performing treatments and interventions, ordering and review of laboratory studies, ordering and review of radiographic studies, pulse oximetry and re-evaluation of patient's condition.      Carmin Muskrat, MD 03/25/14 1754

## 2014-03-25 NOTE — ED Notes (Signed)
RT at bedside getting ABG

## 2014-03-25 NOTE — ED Notes (Signed)
Respiratory paged for breathing tx. 

## 2014-03-26 LAB — IRON AND TIBC
Iron: 15 ug/dL — ABNORMAL LOW (ref 42–135)
Saturation Ratios: 5 % — ABNORMAL LOW (ref 20–55)
TIBC: 319 ug/dL (ref 250–470)
UIBC: 304 ug/dL (ref 125–400)

## 2014-03-26 LAB — GLUCOSE, CAPILLARY
Glucose-Capillary: 209 mg/dL — ABNORMAL HIGH (ref 70–99)
Glucose-Capillary: 224 mg/dL — ABNORMAL HIGH (ref 70–99)
Glucose-Capillary: 226 mg/dL — ABNORMAL HIGH (ref 70–99)
Glucose-Capillary: 251 mg/dL — ABNORMAL HIGH (ref 70–99)

## 2014-03-26 LAB — CBC
HCT: 32.1 % — ABNORMAL LOW (ref 36.0–46.0)
Hemoglobin: 9.6 g/dL — ABNORMAL LOW (ref 12.0–15.0)
MCH: 20.6 pg — ABNORMAL LOW (ref 26.0–34.0)
MCHC: 29.9 g/dL — ABNORMAL LOW (ref 30.0–36.0)
MCV: 68.9 fL — ABNORMAL LOW (ref 78.0–100.0)
Platelets: 285 10*3/uL (ref 150–400)
RBC: 4.66 MIL/uL (ref 3.87–5.11)
RDW: 19.1 % — ABNORMAL HIGH (ref 11.5–15.5)
WBC: 12.7 10*3/uL — ABNORMAL HIGH (ref 4.0–10.5)

## 2014-03-26 LAB — COMPREHENSIVE METABOLIC PANEL
ALT: 11 U/L (ref 0–35)
AST: 10 U/L (ref 0–37)
Albumin: 3.2 g/dL — ABNORMAL LOW (ref 3.5–5.2)
Alkaline Phosphatase: 66 U/L (ref 39–117)
Anion gap: 13 (ref 5–15)
BUN: 10 mg/dL (ref 6–23)
CO2: 24 mEq/L (ref 19–32)
Calcium: 8.8 mg/dL (ref 8.4–10.5)
Chloride: 98 mEq/L (ref 96–112)
Creatinine, Ser: 0.64 mg/dL (ref 0.50–1.10)
GFR calc Af Amer: >90 mL/min (ref >90)
GFR calc non Af Amer: >90 mL/min (ref >90)
Glucose, Bld: 252 mg/dL — ABNORMAL HIGH (ref 70–99)
Potassium: 3.7 mEq/L (ref 3.7–5.3)
Sodium: 135 mEq/L — ABNORMAL LOW (ref 137–147)
Total Bilirubin: 0.4 mg/dL (ref 0.3–1.2)
Total Protein: 7.1 g/dL (ref 6.0–8.3)

## 2014-03-26 LAB — FERRITIN: Ferritin: 70 ng/mL (ref 10–291)

## 2014-03-26 LAB — RETICULOCYTES
RBC.: 4.66 MIL/uL (ref 3.87–5.11)
Retic Count, Absolute: 83.9 10*3/uL (ref 19.0–186.0)
Retic Ct Pct: 1.8 % (ref 0.4–3.1)

## 2014-03-26 LAB — VITAMIN B12: Vitamin B-12: 379 pg/mL (ref 211–911)

## 2014-03-26 LAB — FOLATE: Folate: 3.3 ng/mL — ABNORMAL LOW

## 2014-03-26 MED ORDER — OXYCODONE-ACETAMINOPHEN 5-325 MG PO TABS
1.0000 | ORAL_TABLET | Freq: Four times a day (QID) | ORAL | Status: DC | PRN
  Administered 2014-03-26 (×2): 2 via ORAL
  Administered 2014-03-27: 1 via ORAL
  Administered 2014-03-27 – 2014-03-31 (×17): 2 via ORAL
  Filled 2014-03-26 (×20): qty 2

## 2014-03-26 MED ORDER — INSULIN ASPART 100 UNIT/ML ~~LOC~~ SOLN
0.0000 [IU] | Freq: Three times a day (TID) | SUBCUTANEOUS | Status: DC
  Administered 2014-03-27: 5 [IU] via SUBCUTANEOUS
  Administered 2014-03-27: 8 [IU] via SUBCUTANEOUS
  Administered 2014-03-27 – 2014-03-28 (×3): 5 [IU] via SUBCUTANEOUS
  Administered 2014-03-28: 8 [IU] via SUBCUTANEOUS
  Administered 2014-03-29: 3 [IU] via SUBCUTANEOUS
  Administered 2014-03-29: 8 [IU] via SUBCUTANEOUS
  Administered 2014-03-29: 5 [IU] via SUBCUTANEOUS
  Administered 2014-03-30: 8 [IU] via SUBCUTANEOUS
  Administered 2014-03-30: 3 [IU] via SUBCUTANEOUS
  Administered 2014-03-30: 5 [IU] via SUBCUTANEOUS
  Administered 2014-03-31: 3 [IU] via SUBCUTANEOUS
  Administered 2014-03-31: 2 [IU] via SUBCUTANEOUS

## 2014-03-26 MED ORDER — ALBUTEROL SULFATE (2.5 MG/3ML) 0.083% IN NEBU
2.5000 mg | INHALATION_SOLUTION | RESPIRATORY_TRACT | Status: DC
  Administered 2014-03-26 (×2): 2.5 mg via RESPIRATORY_TRACT
  Filled 2014-03-26 (×2): qty 3

## 2014-03-26 MED ORDER — INSULIN ASPART 100 UNIT/ML ~~LOC~~ SOLN
0.0000 [IU] | Freq: Every day | SUBCUTANEOUS | Status: DC
  Administered 2014-03-26 – 2014-03-27 (×2): 3 [IU] via SUBCUTANEOUS
  Administered 2014-03-28: 2 [IU] via SUBCUTANEOUS
  Administered 2014-03-29: 3 [IU] via SUBCUTANEOUS

## 2014-03-26 MED ORDER — HYDROCODONE-HOMATROPINE 5-1.5 MG/5ML PO SYRP
5.0000 mL | ORAL_SOLUTION | Freq: Four times a day (QID) | ORAL | Status: DC | PRN
  Administered 2014-03-26 – 2014-03-31 (×16): 5 mL via ORAL
  Filled 2014-03-26 (×16): qty 5

## 2014-03-26 MED ORDER — FAMOTIDINE 20 MG PO TABS
20.0000 mg | ORAL_TABLET | Freq: Every day | ORAL | Status: DC
  Administered 2014-03-26 – 2014-03-30 (×5): 20 mg via ORAL
  Filled 2014-03-26 (×5): qty 1

## 2014-03-26 MED ORDER — IPRATROPIUM-ALBUTEROL 0.5-2.5 (3) MG/3ML IN SOLN
3.0000 mL | RESPIRATORY_TRACT | Status: DC
  Administered 2014-03-26 – 2014-03-31 (×30): 3 mL via RESPIRATORY_TRACT
  Filled 2014-03-26 (×7): qty 3
  Filled 2014-03-26: qty 9
  Filled 2014-03-26 (×23): qty 3

## 2014-03-26 MED ORDER — GUAIFENESIN ER 600 MG PO TB12
1200.0000 mg | ORAL_TABLET | Freq: Two times a day (BID) | ORAL | Status: DC
  Administered 2014-03-26 – 2014-03-31 (×11): 1200 mg via ORAL
  Filled 2014-03-26 (×11): qty 2

## 2014-03-26 MED ORDER — ALBUTEROL SULFATE (2.5 MG/3ML) 0.083% IN NEBU
2.5000 mg | INHALATION_SOLUTION | RESPIRATORY_TRACT | Status: DC | PRN
  Administered 2014-03-27: 2.5 mg via RESPIRATORY_TRACT
  Filled 2014-03-26: qty 3

## 2014-03-26 MED ORDER — LOSARTAN POTASSIUM 50 MG PO TABS
50.0000 mg | ORAL_TABLET | Freq: Every day | ORAL | Status: DC
Start: 2014-03-26 — End: 2014-03-31
  Administered 2014-03-26 – 2014-03-31 (×6): 50 mg via ORAL
  Filled 2014-03-26 (×6): qty 1

## 2014-03-26 NOTE — Progress Notes (Signed)
Inpatient Diabetes Program Recommendations  AACE/ADA: New Consensus Statement on Inpatient Glycemic Control (2013)  Target Ranges:  Prepandial:   less than 140 mg/dL      Peak postprandial:   less than 180 mg/dL (1-2 hours)      Critically ill patients:  140 - 180 mg/dL   Results for ERICK, OXENDINE (MRN [id number]) as of 03/26/2014 14:32  Ref. Range 03/25/2014 21:20 03/26/2014 07:28 03/26/2014 11:16  Glucose-Capillary Latest Range: 70-99 mg/dL 202 (H) 209 (H) 224 (H)   Diabetes history: DM2 Outpatient Diabetes medications: Januvia 100 mg daily Current orders for Inpatient glycemic control: Novolog 0-9 units AC  Inpatient Diabetes Program Recommendations Insulin - Basal: If steroids will be continued, please consider ordering low dose basal insulin. Correction (SSI): Please consider increasing Novolog correction scale to Resistant scale while ordered steroids. HgbA1C: Last A1C was 6.9% on 01/16/14. Please consider ordering an an A1C to evaluate glycemic control over the past 2-3 months.  Thanks, Barnie Alderman, RN, MSN, CCRN, CDE Diabetes Coordinator Inpatient Diabetes Program [phone] (Team Pager) [phone] ([facility] office) [phone] [facility] office)

## 2014-03-26 NOTE — Progress Notes (Signed)
[facility] PROGRESS NOTE  [name] TIR:[id number] DOB: October 03, 1965 DOA: 03/25/2014 PCP: Vic Blackbird, MD  Assessment/Plan: 1. Acute respiratory failure. Likely related to his exacerbation of COPD. Continue to wean down oxygen as tolerated. 2. COPD Exacerbation. Continue steroids, antibiotics and bronchodilators. Progress will likely be slow. 3. Tobacco use. Counseled on the importance of tobacco cessation 4. Leukocytosis. Likely reactive. Continue to monitor. No signs of infection. 5. Sleep apnea. Continue Cipro therapy 6. Diabetes. Sliding scale insulin  Code Status: Full code Family Communication: Discussed with patient  Disposition Plan: Discharge home once improved   Consultants:    Procedures:    Antibiotics:  Doxycycline 12/2  HPI/Subjective: Continues to wheeze and short of breath. Productive cough  Objective: Filed Vitals:   03/26/14 1500  BP: 120/56  Pulse: 105  Temp: 98 F (36.7 C)  Resp: 20    Intake/Output Summary (Last 24 hours) at 03/26/14 1835 Last data filed at 03/26/14 1733  Gross per 24 hour  Intake 1804.08 ml  Output   1150 ml  Net 654.08 ml   Filed Weights   03/25/14 1542 03/25/14 1958  Weight: 131.543 kg (290 lb) 129.4 kg (285 lb 4.4 oz)    Exam:   General:  NAD  Cardiovascular: S1, S2 RRR  Respiratory: bilateral wheezing  Abdomen: soft, nt, nd, bs+  Musculoskeletal: no edema b/l   Data Reviewed: Basic Metabolic Panel:  Recent Labs Lab 03/25/14 1539 03/26/14 0458  NA 134* 135*  K 3.9 3.7  CL 92* 98  CO2 27 24  GLUCOSE 141* 252*  BUN 7 10  CREATININE 0.75 0.64  CALCIUM 9.3 8.8   Liver Function Tests:  Recent Labs Lab 03/25/14 1539 03/26/14 0458  AST 16 10  ALT 14 11  ALKPHOS 81 66  BILITOT 0.8 0.4  PROT 7.7 7.1  ALBUMIN 3.6 3.2*   No results for input(s): LIPASE, AMYLASE in the last 168 hours. No results for input(s): AMMONIA in the last 168 hours. CBC:  Recent Labs Lab  03/25/14 1539 03/26/14 0458  WBC 16.7* 12.7*  NEUTROABS 13.8*  --   HGB 10.3* 9.6*  HCT 34.8* 32.1*  MCV 70.0* 68.9*  PLT 284 285   Cardiac Enzymes:  Recent Labs Lab 03/25/14 1539  TROPONINI <0.30   BNP (last 3 results)  Recent Labs  10/18/13 1722 03/25/14 1539  PROBNP 264.4* 562.3*   CBG:  Recent Labs Lab 03/25/14 2120 03/26/14 0728 03/26/14 1116 03/26/14 1633  GLUCAP 202* 209* 224* 226*    No results found for this or any previous visit (from the past 240 hour(s)).   Studies: Dg Chest Portable 1 View  03/25/2014   CLINICAL DATA:  Shortness of breath.  EXAM: PORTABLE CHEST - 1 VIEW  COMPARISON:  PA and lateral chest 01/19/2014.  CT chest 01/10/2013.  FINDINGS: There is cardiomegaly without edema. Lungs are clear. No pneumothorax or pleural effusion.  IMPRESSION: Cardiomegaly without acute disease.   Electronically Signed   By: Inge Rise M.D.   On: 03/25/2014 16:06    Scheduled Meds: . atorvastatin  10 mg Oral QHS  . doxycycline  100 mg Oral Q12H  . DULoxetine  60 mg Oral Daily  . ferrous sulfate  325 mg Oral BID WC  . guaiFENesin  1,200 mg Oral BID  . heparin  5,000 Units Subcutaneous 3 times per day  . insulin aspart  0-9 Units Subcutaneous TID WC  . ipratropium-albuterol  3 mL Nebulization Q4H  . methylphenidate  20  mg Oral BID WC  . methylPREDNISolone (SOLU-MEDROL) injection  60 mg Intravenous Q6H  . nicotine  14 mg Transdermal Daily  . pantoprazole  40 mg Oral Daily  . pregabalin  150 mg Oral BID  . roflumilast  500 mcg Oral Daily  . senna  1 tablet Oral BID  . sodium chloride  3 mL Intravenous Q12H   Continuous Infusions: . sodium chloride 75 mL/hr at 03/26/14 1442    Principal Problem:   COPD exacerbation Active Problems:   Microcytic anemia   GERD (gastroesophageal reflux disease)   HTN (hypertension)   DM (diabetes mellitus) type II controlled, neurological manifestation   Cigarette smoker   Depression   Morbid obesity    Respiratory distress   Chest pain on respiration   Leukocytosis    Time spent: 73mins    Edrei Norgaard  [facility] Pager [phone]. If 7PM-7AM, please contact night-coverage at [email or web address], password [facility] 03/26/2014, 6:35 PM  LOS: 1 day

## 2014-03-26 NOTE — Progress Notes (Signed)
UR chart review completed.  

## 2014-03-26 NOTE — Plan of Care (Signed)
Problem: ICU Phase Progression Outcomes Goal: Dyspnea controlled at rest Outcome: Completed/Met Date Met:  03/26/14     

## 2014-03-26 NOTE — Care Management Note (Signed)
    Page 1 of 1   03/30/2014     3:06:37 PM CARE MANAGEMENT NOTE 03/30/2014  Patient:  [name]   Account Number:  [id number]  Date Initiated:  03/26/2014  Documentation initiated by:  Theophilus Kinds  Subjective/Objective Assessment:   Pt admitted from home with COPD exacerbation. Pt lives at home with her sister and will return home at discharge. Pt is fairly independent with ADls'. Pt has home O2, neb machine, and cpap for home use.     Action/Plan:   Will continue to follow for discharge planning needs. [facility] referral made.   Anticipated DC Date:  03/30/2014   Anticipated DC Plan:  [place]  CM consult      Choice offered to / List presented to:             Status of service:  Completed, signed off Medicare Important Message given?  YES (If response is "NO", the following Medicare IM given date fields will be blank) Date Medicare IM given:  03/30/2014 Medicare IM given by:  Theophilus Kinds Date Additional Medicare IM given:   Additional Medicare IM given by:    Discharge Disposition:  HOME/SELF CARE  Per UR Regulation:    If discussed at Long Length of Stay Meetings, dates discussed:    Comments:  03/30/14 [place], RN BSN CM Anticipate discharged within 24 hours. No CM needs noted.  03/26/14 [place], RN BSN CM

## 2014-03-27 ENCOUNTER — Ambulatory Visit: Payer: Self-pay | Admitting: Internal Medicine

## 2014-03-27 ENCOUNTER — Encounter: Payer: Self-pay | Admitting: Internal Medicine

## 2014-03-27 LAB — BASIC METABOLIC PANEL
Anion gap: 12 (ref 5–15)
BUN: 15 mg/dL (ref 6–23)
CO2: 25 mEq/L (ref 19–32)
Calcium: 9.3 mg/dL (ref 8.4–10.5)
Chloride: 104 mEq/L (ref 96–112)
Creatinine, Ser: 0.71 mg/dL (ref 0.50–1.10)
GFR calc Af Amer: >90 mL/min (ref >90)
GFR calc non Af Amer: >90 mL/min (ref >90)
Glucose, Bld: 240 mg/dL — ABNORMAL HIGH (ref 70–99)
Potassium: 4.5 mEq/L (ref 3.7–5.3)
Sodium: 141 mEq/L (ref 137–147)

## 2014-03-27 LAB — GLUCOSE, CAPILLARY
Glucose-Capillary: 235 mg/dL — ABNORMAL HIGH (ref 70–99)
Glucose-Capillary: 300 mg/dL — ABNORMAL HIGH (ref 70–99)
Glucose-Capillary: 171 mg/dL — ABNORMAL HIGH (ref 70–99)
Glucose-Capillary: 201 mg/dL — ABNORMAL HIGH (ref 70–99)
Glucose-Capillary: 253 mg/dL — ABNORMAL HIGH (ref 70–99)

## 2014-03-27 LAB — CBC
HCT: 31.3 % — ABNORMAL LOW (ref 36.0–46.0)
Hemoglobin: 9.2 g/dL — ABNORMAL LOW (ref 12.0–15.0)
MCH: 21 pg — ABNORMAL LOW (ref 26.0–34.0)
MCHC: 29.4 g/dL — ABNORMAL LOW (ref 30.0–36.0)
MCV: 71.3 fL — ABNORMAL LOW (ref 78.0–100.0)
Platelets: 292 10*3/uL (ref 150–400)
RBC: 4.39 MIL/uL (ref 3.87–5.11)
RDW: 19.5 % — ABNORMAL HIGH (ref 11.5–15.5)
WBC: 24.8 10*3/uL — ABNORMAL HIGH (ref 4.0–10.5)

## 2014-03-27 MED ORDER — PANTOPRAZOLE SODIUM 40 MG PO TBEC
40.0000 mg | DELAYED_RELEASE_TABLET | Freq: Two times a day (BID) | ORAL | Status: DC
  Administered 2014-03-28 – 2014-03-31 (×7): 40 mg via ORAL
  Filled 2014-03-27 (×6): qty 1

## 2014-03-27 NOTE — Progress Notes (Signed)
Pt unable to wear CPAP tonight states she can't breath. PRN treatment given. Talk with patient and explained to her the importance of her to stop smoking to help with her breathing. Pt got a little emotional and told her not fussing just want her to understand that its really hurting her with her continuous smoking habit.

## 2014-03-27 NOTE — Progress Notes (Signed)
Pt slow to improve from COPD exacerbation. Probably will be in hospital over the weekend. IM given by Vladimir Creeks, RN CM.

## 2014-03-27 NOTE — Plan of Care (Signed)
Problem: Phase I Progression Outcomes Goal: Pain controlled Outcome: Progressing     

## 2014-03-27 NOTE — Progress Notes (Signed)
Completed pt treatment and asked pt if she was ready to put on her CPAP machine for the night. She wasn't stated she took a late nap. She will call when ready yo put on. Put in no distress and playing game on her laptop.

## 2014-03-27 NOTE — Progress Notes (Signed)
Inpatient Diabetes Program Recommendations  AACE/ADA: New Consensus Statement on Inpatient Glycemic Control (2013)  Target Ranges:  Prepandial:   less than 140 mg/dL      Peak postprandial:   less than 180 mg/dL (1-2 hours)      Critically ill patients:  140 - 180 mg/dL   Results for KAMRYNN, MELOTT (MRN [id number]) as of 03/27/2014 09:21  Ref. Range 03/26/2014 07:28 03/26/2014 11:16 03/26/2014 16:33 03/26/2014 21:56 03/27/2014 07:24  Glucose-Capillary Latest Range: 70-99 mg/dL 209 (H) 224 (H) 226 (H) 251 (H) 235 (H)   Diabetes history: DM2 Outpatient Diabetes medications: Januvia 100 mg daily Current orders for Inpatient glycemic control: Novolog 0-15 units AC, Novolog 0-5 units HS  Inpatient Diabetes Program Recommendations Insulin - Basal: If steroids will be continued, please consider ordering low dose basal insulin. Correction (SSI): Please consider increasing Novolog correction scale to Resistant scale while ordered steroids. HgbA1C: Last A1C was 6.9% on 01/16/14. Please consider ordering an an A1C to evaluate glycemic control over the past 2-3 months.  Thanks, Barnie Alderman, RN, MSN, CCRN, CDE Diabetes Coordinator Inpatient Diabetes Program [phone] (Team Pager) [phone] ([facility] office) [phone] [facility] office)

## 2014-03-27 NOTE — Progress Notes (Signed)
[facility] PROGRESS NOTE  [name] [id number]:[id number] DOB: [birth year] DOA: 03/25/2014 PCP: Sheila Blackbird, MD  Assessment/Plan: 1. Acute respiratory failure. Likely related to his exacerbation of COPD. Continue to wean down oxygen as tolerated. 2. COPD Exacerbation. Continue steroids, antibiotics and bronchodilators. Progress will likely be slow. She still very short of breath and wheezing. 3. Tobacco use. Counseled on the importance of tobacco cessation 4. Leukocytosis. Likely reactive. Continue to monitor. No signs of infection. 5. Sleep apnea. Continue CPAP therapy 6. Diabetes. Sliding scale insulin  Code Status: Full code Family Communication: Discussed with patient  Disposition Plan: Discharge home once improved   Consultants:    Procedures:    Antibiotics:  Doxycycline 12/2  HPI/Subjective: Appears more short of breath today. Still having cough. Continues to wheeze  Objective: Filed Vitals:   03/27/14 1403  BP: 96/73  Pulse: 91  Temp: 98.5 F (36.9 C)  Resp: 20    Intake/Output Summary (Last 24 hours) at 03/27/14 1845 Last data filed at 03/27/14 1200  Gross per 24 hour  Intake    960 ml  Output    750 ml  Net    210 ml   Filed Weights   03/25/14 1542 03/25/14 1958  Weight: 131.543 kg (290 lb) 129.4 kg (285 lb 4.4 oz)    Exam:   General:  NAD  Cardiovascular: S1, S2 RRR  Respiratory: bilateral wheezing  Abdomen: soft, nt, nd, bs+  Musculoskeletal: no edema b/l   Data Reviewed: Basic Metabolic Panel:  Recent Labs Lab 03/25/14 1539 03/26/14 0458 03/27/14 0502  NA 134* 135* 141  K 3.9 3.7 4.5  CL 92* 98 104  CO2 27 24 25   GLUCOSE 141* 252* 240*  BUN 7 10 15   CREATININE 0.75 0.64 0.71  CALCIUM 9.3 8.8 9.3   Liver Function Tests:  Recent Labs Lab 03/25/14 1539 03/26/14 0458  AST 16 10  ALT 14 11  ALKPHOS 81 66  BILITOT 0.8 0.4  PROT 7.7 7.1  ALBUMIN 3.6 3.2*   No results for input(s): LIPASE, AMYLASE in  the last 168 hours. No results for input(s): AMMONIA in the last 168 hours. CBC:  Recent Labs Lab 03/25/14 1539 03/26/14 0458 03/27/14 0502  WBC 16.7* 12.7* 24.8*  NEUTROABS 13.8*  --   --   HGB 10.3* 9.6* 9.2*  HCT 34.8* 32.1* 31.3*  MCV 70.0* 68.9* 71.3*  PLT 284 285 292   Cardiac Enzymes:  Recent Labs Lab 03/25/14 1539  TROPONINI <0.30   BNP (last 3 results)  Recent Labs  10/18/13 1722 03/25/14 1539  PROBNP 264.4* 562.3*   CBG:  Recent Labs Lab 03/26/14 1633 03/26/14 2156 03/27/14 0724 03/27/14 1111 03/27/14 1621  GLUCAP 226* 251* 235* 300* 171*    No results found for this or any previous visit (from the past 240 hour(s)).   Studies: No results found.  Scheduled Meds: . atorvastatin  10 mg Oral QHS  . doxycycline  100 mg Oral Q12H  . DULoxetine  60 mg Oral Daily  . famotidine  20 mg Oral QHS  . ferrous sulfate  325 mg Oral BID WC  . guaiFENesin  1,200 mg Oral BID  . heparin  5,000 Units Subcutaneous 3 times per day  . insulin aspart  0-15 Units Subcutaneous TID WC  . insulin aspart  0-5 Units Subcutaneous QHS  . ipratropium-albuterol  3 mL Nebulization Q4H  . losartan  50 mg Oral Daily  . methylphenidate  20 mg Oral BID WC  .  methylPREDNISolone (SOLU-MEDROL) injection  60 mg Intravenous Q6H  . nicotine  14 mg Transdermal Daily  . [START ON 03/28/2014] pantoprazole  40 mg Oral BID AC  . pregabalin  150 mg Oral BID  . roflumilast  500 mcg Oral Daily  . senna  1 tablet Oral BID  . sodium chloride  3 mL Intravenous Q12H   Continuous Infusions: . sodium chloride 75 mL/hr at 03/26/14 1442    Principal Problem:   COPD exacerbation Active Problems:   Microcytic anemia   GERD (gastroesophageal reflux disease)   HTN (hypertension)   DM (diabetes mellitus) type II controlled, neurological manifestation   Cigarette smoker   Depression   Morbid obesity   Respiratory distress   Chest pain on respiration   Leukocytosis    Time spent:  35mins    [name]  [facility] Pager [phone]. If 7PM-7AM, please contact night-coverage at [email or web address], password [facility] 03/27/2014, 6:45 PM  LOS: 2 days

## 2014-03-28 LAB — GLUCOSE, CAPILLARY
Glucose-Capillary: 237 mg/dL — ABNORMAL HIGH (ref 70–99)
Glucose-Capillary: 283 mg/dL — ABNORMAL HIGH (ref 70–99)
Glucose-Capillary: 207 mg/dL — ABNORMAL HIGH (ref 70–99)
Glucose-Capillary: 232 mg/dL — ABNORMAL HIGH (ref 70–99)

## 2014-03-28 MED ORDER — METHYLPREDNISOLONE SODIUM SUCC 125 MG IJ SOLR
60.0000 mg | Freq: Two times a day (BID) | INTRAMUSCULAR | Status: DC
  Administered 2014-03-28 – 2014-03-29 (×2): 60 mg via INTRAVENOUS
  Filled 2014-03-28 (×2): qty 2

## 2014-03-28 NOTE — Progress Notes (Signed)
[facility] PROGRESS NOTE  [name] [id number]:[id number] DOB: Jul 14, 1965 DOA: 03/25/2014 PCP: Vic Blackbird, MD  Assessment/Plan: 1. Acute respiratory failure. Likely related to exacerbation of COPD. Continue to wean down oxygen as tolerated. 2. COPD Exacerbation. Continue steroids, antibiotics and bronchodilators. She is progressing slowly. She still very short of breath and wheezing, but appears to be slowly improving. 3. Tobacco use. Counseled on the importance of tobacco cessation 4. Leukocytosis. Likely reactive. Continue to monitor. No signs of infection. 5. Sleep apnea. Continue CPAP therapy 6. Diabetes. Sliding scale insulin  Code Status: Full code Family Communication: Discussed with patient  Disposition Plan: Discharge home once improved   Consultants:    Procedures:    Antibiotics:  Doxycycline 12/2  HPI/Subjective: Feeling better today. Continues to be short of breath and wheezing, but feels like she is able to expectorate more sputum.  Objective: Filed Vitals:   03/28/14 0605  BP: 122/66  Pulse: 83  Temp: 98.4 F (36.9 C)  Resp: 20    Intake/Output Summary (Last 24 hours) at 03/28/14 1653 Last data filed at 03/28/14 1100  Gross per 24 hour  Intake    600 ml  Output    400 ml  Net    200 ml   Filed Weights   03/25/14 1542 03/25/14 1958  Weight: 131.543 kg (290 lb) 129.4 kg (285 lb 4.4 oz)    Exam:   General:  NAD  Cardiovascular: S1, S2 RRR  Respiratory: bilateral wheezing  Abdomen: soft, nt, nd, bs+  Musculoskeletal: no edema b/l   Data Reviewed: Basic Metabolic Panel:  Recent Labs Lab 03/25/14 1539 03/26/14 0458 03/27/14 0502  NA 134* 135* 141  K 3.9 3.7 4.5  CL 92* 98 104  CO2 27 24 25   GLUCOSE 141* 252* 240*  BUN 7 10 15   CREATININE 0.75 0.64 0.71  CALCIUM 9.3 8.8 9.3   Liver Function Tests:  Recent Labs Lab 03/25/14 1539 03/26/14 0458  AST 16 10  ALT 14 11  ALKPHOS 81 66  BILITOT 0.8 0.4  PROT  7.7 7.1  ALBUMIN 3.6 3.2*   No results for input(s): LIPASE, AMYLASE in the last 168 hours. No results for input(s): AMMONIA in the last 168 hours. CBC:  Recent Labs Lab 03/25/14 1539 03/26/14 0458 03/27/14 0502  WBC 16.7* 12.7* 24.8*  NEUTROABS 13.8*  --   --   HGB 10.3* 9.6* 9.2*  HCT 34.8* 32.1* 31.3*  MCV 70.0* 68.9* 71.3*  PLT 284 285 292   Cardiac Enzymes:  Recent Labs Lab 03/25/14 1539  TROPONINI <0.30   BNP (last 3 results)  Recent Labs  10/18/13 1722 03/25/14 1539  PROBNP 264.4* 562.3*   CBG:  Recent Labs Lab 03/27/14 1621 03/27/14 1906 03/27/14 2121 03/28/14 0753 03/28/14 1114  GLUCAP 171* 201* 253* 237* 283*    No results found for this or any previous visit (from the past 240 hour(s)).   Studies: No results found.  Scheduled Meds: . atorvastatin  10 mg Oral QHS  . doxycycline  100 mg Oral Q12H  . DULoxetine  60 mg Oral Daily  . famotidine  20 mg Oral QHS  . ferrous sulfate  325 mg Oral BID WC  . guaiFENesin  1,200 mg Oral BID  . heparin  5,000 Units Subcutaneous 3 times per day  . insulin aspart  0-15 Units Subcutaneous TID WC  . insulin aspart  0-5 Units Subcutaneous QHS  . ipratropium-albuterol  3 mL Nebulization Q4H  . losartan  50 mg Oral Daily  . methylphenidate  20 mg Oral BID WC  . methylPREDNISolone (SOLU-MEDROL) injection  60 mg Intravenous Q6H  . nicotine  14 mg Transdermal Daily  . pantoprazole  40 mg Oral BID AC  . pregabalin  150 mg Oral BID  . roflumilast  500 mcg Oral Daily  . senna  1 tablet Oral BID  . sodium chloride  3 mL Intravenous Q12H   Continuous Infusions: . sodium chloride 75 mL/hr at 03/26/14 1442    Principal Problem:   COPD exacerbation Active Problems:   Microcytic anemia   GERD (gastroesophageal reflux disease)   HTN (hypertension)   DM (diabetes mellitus) type II controlled, neurological manifestation   Cigarette smoker   Depression   Morbid obesity   Respiratory distress   Chest pain on  respiration   Leukocytosis    Time spent: 27mins    MEMON,JEHANZEB  [facility] Pager [phone]. If 7PM-7AM, please contact night-coverage at [email or web address], password [facility] 03/28/2014, 4:53 PM  LOS: 3 days

## 2014-03-28 NOTE — Plan of Care (Signed)
Problem: Consults Goal: Respiratory Problems Patient Education See Patient Education Module for education specifics. Outcome: Completed/Met Date Met:  03/28/14 Goal: Skin Care Protocol Initiated - if Braden Score 18 or less If consults are not indicated, leave blank or document N/A Outcome: Not Applicable Date Met:  03/28/14     

## 2014-03-29 DIAGNOSIS — J9601 Acute respiratory failure with hypoxia: Secondary | ICD-10-CM

## 2014-03-29 LAB — GLUCOSE, CAPILLARY
Glucose-Capillary: 192 mg/dL — ABNORMAL HIGH (ref 70–99)
Glucose-Capillary: 232 mg/dL — ABNORMAL HIGH (ref 70–99)
Glucose-Capillary: 253 mg/dL — ABNORMAL HIGH (ref 70–99)
Glucose-Capillary: 278 mg/dL — ABNORMAL HIGH (ref 70–99)

## 2014-03-29 MED ORDER — PREDNISONE 20 MG PO TABS
40.0000 mg | ORAL_TABLET | Freq: Every day | ORAL | Status: DC
  Administered 2014-03-30 – 2014-03-31 (×2): 40 mg via ORAL
  Filled 2014-03-29 (×2): qty 2

## 2014-03-29 NOTE — Plan of Care (Signed)
Problem: Phase I Progression Outcomes Goal: O2 sats > or equal 90% or at baseline Outcome: Completed/Met Date Met:  03/29/14 Goal: Dyspnea controlled at rest Outcome: Completed/Met Date Met:  03/29/14 Goal: Hemodynamically stable Outcome: Completed/Met Date Met:  03/29/14 Goal: Flu/PneumoVaccines if indicated Outcome: Not Applicable Date Met:  43/60/16 Goal: Pain controlled Outcome: Completed/Met Date Met:  03/29/14 Goal: Progress activity as tolerated unless otherwise ordered Outcome: Completed/Met Date Met:  03/29/14 Goal: Discharge plan established Outcome: Completed/Met Date Met:  03/29/14 Goal: Tolerating diet Outcome: Completed/Met Date Met:  03/29/14

## 2014-03-29 NOTE — Progress Notes (Signed)
[facility] PROGRESS NOTE  [name] [id number]:[id number] DOB: 23-Mar-1966 DOA: 03/25/2014 PCP: Vic Blackbird, MD  Assessment/Plan: 1. Acute respiratory failure. Likely related to exacerbation of COPD. now appears to have weaned off of oxygen. 2. COPD Exacerbation. Continues to improve. Will discontinue intravenous steroids since her prednisone. Continue antibiotics and bronchodilators.. 3. Tobacco use. Counseled on the importance of tobacco cessation 4. Leukocytosis. Likely reactive. Continue to monitor. No signs of infection. 5. Sleep apnea. Continue CPAP therapy 6. Diabetes. Sliding scale insulin  Code Status: Full code Family Communication: Discussed with patient  Disposition Plan: Discharge home once improved, likely in a.m.   Consultants:    Procedures:    Antibiotics:  Doxycycline 12/2  HPI/Subjective: Continues to slowly improve. Was able to ambulate today off of oxygen. Still feels mildly short of breath and his wheezing.  Objective: Filed Vitals:   03/29/14 1448  BP: 156/76  Pulse: 94  Temp: 98.2 F (36.8 C)  Resp: 20    Intake/Output Summary (Last 24 hours) at 03/29/14 1816 Last data filed at 03/29/14 1200  Gross per 24 hour  Intake    480 ml  Output   1000 ml  Net   -520 ml   Filed Weights   03/25/14 1542 03/25/14 1958  Weight: 131.543 kg (290 lb) 129.4 kg (285 lb 4.4 oz)    Exam:   General:  NAD  Cardiovascular: S1, S2 RRR  Respiratory: bilateral wheezing  Abdomen: soft, nt, nd, bs+  Musculoskeletal: no edema b/l   Data Reviewed: Basic Metabolic Panel:  Recent Labs Lab 03/25/14 1539 03/26/14 0458 03/27/14 0502  NA 134* 135* 141  K 3.9 3.7 4.5  CL 92* 98 104  CO2 27 24 25   GLUCOSE 141* 252* 240*  BUN 7 10 15   CREATININE 0.75 0.64 0.71  CALCIUM 9.3 8.8 9.3   Liver Function Tests:  Recent Labs Lab 03/25/14 1539 03/26/14 0458  AST 16 10  ALT 14 11  ALKPHOS 81 66  BILITOT 0.8 0.4  PROT 7.7 7.1  ALBUMIN  3.6 3.2*   No results for input(s): LIPASE, AMYLASE in the last 168 hours. No results for input(s): AMMONIA in the last 168 hours. CBC:  Recent Labs Lab 03/25/14 1539 03/26/14 0458 03/27/14 0502  WBC 16.7* 12.7* 24.8*  NEUTROABS 13.8*  --   --   HGB 10.3* 9.6* 9.2*  HCT 34.8* 32.1* 31.3*  MCV 70.0* 68.9* 71.3*  PLT 284 285 292   Cardiac Enzymes:  Recent Labs Lab 03/25/14 1539  TROPONINI <0.30   BNP (last 3 results)  Recent Labs  10/18/13 1722 03/25/14 1539  PROBNP 264.4* 562.3*   CBG:  Recent Labs Lab 03/28/14 1651 03/28/14 2144 03/29/14 0800 03/29/14 1132 03/29/14 1642  GLUCAP 207* 232* 192* 232* 253*    No results found for this or any previous visit (from the past 240 hour(s)).   Studies: No results found.  Scheduled Meds: . atorvastatin  10 mg Oral QHS  . doxycycline  100 mg Oral Q12H  . DULoxetine  60 mg Oral Daily  . famotidine  20 mg Oral QHS  . ferrous sulfate  325 mg Oral BID WC  . guaiFENesin  1,200 mg Oral BID  . heparin  5,000 Units Subcutaneous 3 times per day  . insulin aspart  0-15 Units Subcutaneous TID WC  . insulin aspart  0-5 Units Subcutaneous QHS  . ipratropium-albuterol  3 mL Nebulization Q4H  . losartan  50 mg Oral Daily  . methylphenidate  20 mg Oral BID WC  . nicotine  14 mg Transdermal Daily  . pantoprazole  40 mg Oral BID AC  . [START ON 03/30/2014] predniSONE  40 mg Oral Q breakfast  . pregabalin  150 mg Oral BID  . roflumilast  500 mcg Oral Daily  . senna  1 tablet Oral BID  . sodium chloride  3 mL Intravenous Q12H   Continuous Infusions: . sodium chloride 75 mL/hr at 03/26/14 1442    Principal Problem:   COPD exacerbation Active Problems:   Microcytic anemia   GERD (gastroesophageal reflux disease)   HTN (hypertension)   DM (diabetes mellitus) type II controlled, neurological manifestation   Cigarette smoker   Depression   Morbid obesity   Respiratory distress   Chest pain on respiration    Leukocytosis    Time spent: 25mins    MEMON,JEHANZEB  [facility] Pager [phone]. If 7PM-7AM, please contact night-coverage at [email or web address], password [facility] 03/29/2014, 6:16 PM  LOS: 4 days

## 2014-03-30 ENCOUNTER — Encounter: Payer: Self-pay | Admitting: Internal Medicine

## 2014-03-30 LAB — GLUCOSE, CAPILLARY
Glucose-Capillary: 185 mg/dL — ABNORMAL HIGH (ref 70–99)
Glucose-Capillary: 300 mg/dL — ABNORMAL HIGH (ref 70–99)
Glucose-Capillary: 215 mg/dL — ABNORMAL HIGH (ref 70–99)
Glucose-Capillary: 192 mg/dL — ABNORMAL HIGH (ref 70–99)

## 2014-03-30 NOTE — Progress Notes (Signed)
[facility] PROGRESS NOTE  MYRTLE HALLER DTO:[id number] DOB: 09-15-1965 DOA: 03/25/2014 PCP: Vic Blackbird, MD  Assessment/Plan: 1. Acute respiratory failure. Likely related to exacerbation of COPD. now appears to have weaned off of oxygen. 2. COPD Exacerbation. Feels a little worse today. More short of breath and having worsening pleuritic chest pain. Will continue with current treatments in the hospital another day.. 3. Tobacco use. Counseled on the importance of tobacco cessation 4. Leukocytosis. Likely reactive. Continue to monitor. No signs of infection. 5. Sleep apnea. Continue CPAP therapy 6. Diabetes. Sliding scale insulin  Code Status: Full code Family Communication: Discussed with patient  Disposition Plan: Discharge home once improved, likely in a.m.   Consultants:    Procedures:    Antibiotics:  Doxycycline 12/2  HPI/Subjective: Feels worse today. Having increasing shortness of breath. Increasing pleuritic chest pain.  Objective: Filed Vitals:   03/30/14 1422  BP: 149/61  Pulse: 92  Temp: 99.2 F (37.3 C)  Resp: 20    Intake/Output Summary (Last 24 hours) at 03/30/14 1923 Last data filed at 03/30/14 1420  Gross per 24 hour  Intake    480 ml  Output   1300 ml  Net   -820 ml   Filed Weights   03/25/14 1542 03/25/14 1958  Weight: 131.543 kg (290 lb) 129.4 kg (285 lb 4.4 oz)    Exam:   General:  NAD  Cardiovascular: S1, S2 RRR  Respiratory: bilateral wheezing  Abdomen: soft, nt, nd, bs+  Musculoskeletal: no edema b/l   Data Reviewed: Basic Metabolic Panel:  Recent Labs Lab 03/25/14 1539 03/26/14 0458 03/27/14 0502  NA 134* 135* 141  K 3.9 3.7 4.5  CL 92* 98 104  CO2 27 24 25   GLUCOSE 141* 252* 240*  BUN 7 10 15   CREATININE 0.75 0.64 0.71  CALCIUM 9.3 8.8 9.3   Liver Function Tests:  Recent Labs Lab 03/25/14 1539 03/26/14 0458  AST 16 10  ALT 14 11  ALKPHOS 81 66  BILITOT 0.8 0.4  PROT 7.7 7.1  ALBUMIN  3.6 3.2*   No results for input(s): LIPASE, AMYLASE in the last 168 hours. No results for input(s): AMMONIA in the last 168 hours. CBC:  Recent Labs Lab 03/25/14 1539 03/26/14 0458 03/27/14 0502  WBC 16.7* 12.7* 24.8*  NEUTROABS 13.8*  --   --   HGB 10.3* 9.6* 9.2*  HCT 34.8* 32.1* 31.3*  MCV 70.0* 68.9* 71.3*  PLT 284 285 292   Cardiac Enzymes:  Recent Labs Lab 03/25/14 1539  TROPONINI <0.30   BNP (last 3 results)  Recent Labs  10/18/13 1722 03/25/14 1539  PROBNP 264.4* 562.3*   CBG:  Recent Labs Lab 03/29/14 1642 03/29/14 2204 03/30/14 0731 03/30/14 1121 03/30/14 1656  GLUCAP 253* 278* 185* 300* 215*    No results found for this or any previous visit (from the past 240 hour(s)).   Studies: No results found.  Scheduled Meds: . atorvastatin  10 mg Oral QHS  . doxycycline  100 mg Oral Q12H  . DULoxetine  60 mg Oral Daily  . famotidine  20 mg Oral QHS  . ferrous sulfate  325 mg Oral BID WC  . guaiFENesin  1,200 mg Oral BID  . heparin  5,000 Units Subcutaneous 3 times per day  . insulin aspart  0-15 Units Subcutaneous TID WC  . insulin aspart  0-5 Units Subcutaneous QHS  . ipratropium-albuterol  3 mL Nebulization Q4H  . losartan  50 mg Oral Daily  .  methylphenidate  20 mg Oral BID WC  . nicotine  14 mg Transdermal Daily  . pantoprazole  40 mg Oral BID AC  . predniSONE  40 mg Oral Q breakfast  . pregabalin  150 mg Oral BID  . roflumilast  500 mcg Oral Daily  . senna  1 tablet Oral BID  . sodium chloride  3 mL Intravenous Q12H   Continuous Infusions: . sodium chloride 75 mL/hr at 03/26/14 1442    Principal Problem:   COPD exacerbation Active Problems:   Microcytic anemia   GERD (gastroesophageal reflux disease)   HTN (hypertension)   DM (diabetes mellitus) type II controlled, neurological manifestation   Cigarette smoker   Depression   Morbid obesity   Respiratory distress   Chest pain on respiration   Leukocytosis    Time spent:  10mins    Iyla Balzarini  [facility] Pager [phone]. If 7PM-7AM, please contact night-coverage at [email or web address], password [facility] 03/30/2014, 7:23 PM  LOS: 5 days

## 2014-03-30 NOTE — Telephone Encounter (Signed)
Dr. Melvyn Novas please advise if there is anything we can offer this pt: I was admitted into [facility] on 03/25/14 and have been here since trying to get my coughing and shortness of breath under control. I seemed to be feeling a little better yesterday in hopes that I may get to go home today. I woke up this morning feeling worse again. It feels very tight more across the top of my chest today. The hospitalist has not been in to see me yet today, but I am afraid to tell them that I am ready to go home feeling this way. Dr. Melvyn Novas had told me that he does not think I have copd, but I am being treated as so due to it usually helps me to get better for awhile. So, my question is to ask Dr. Melvyn Novas if he could possibly consult with the hospitalist here to see if he can give some input on the best way to get me better or if there are some test that can be done while I am here. I feel like I am never going to get better if the doctors I am seeing are not on the same page. Please ask him if he can somehow help me.  Sincerely, [name]

## 2014-03-31 DIAGNOSIS — J9621 Acute and chronic respiratory failure with hypoxia: Secondary | ICD-10-CM

## 2014-03-31 LAB — BASIC METABOLIC PANEL
Anion gap: 10 (ref 5–15)
BUN: 12 mg/dL (ref 6–23)
CO2: 33 mEq/L — ABNORMAL HIGH (ref 19–32)
Calcium: 8.5 mg/dL (ref 8.4–10.5)
Chloride: 99 mEq/L (ref 96–112)
Creatinine, Ser: 0.79 mg/dL (ref 0.50–1.10)
GFR calc Af Amer: >90 mL/min (ref >90)
GFR calc non Af Amer: >90 mL/min (ref >90)
Glucose, Bld: 131 mg/dL — ABNORMAL HIGH (ref 70–99)
Potassium: 3.2 mEq/L — ABNORMAL LOW (ref 3.7–5.3)
Sodium: 142 mEq/L (ref 137–147)

## 2014-03-31 LAB — GLUCOSE, CAPILLARY
Glucose-Capillary: 139 mg/dL — ABNORMAL HIGH (ref 70–99)
Glucose-Capillary: 196 mg/dL — ABNORMAL HIGH (ref 70–99)

## 2014-03-31 LAB — CBC
HCT: 30.8 % — ABNORMAL LOW (ref 36.0–46.0)
Hemoglobin: 9.2 g/dL — ABNORMAL LOW (ref 12.0–15.0)
MCH: 20.9 pg — ABNORMAL LOW (ref 26.0–34.0)
MCHC: 29.9 g/dL — ABNORMAL LOW (ref 30.0–36.0)
MCV: 70 fL — ABNORMAL LOW (ref 78.0–100.0)
Platelets: 235 10*3/uL (ref 150–400)
RBC: 4.4 MIL/uL (ref 3.87–5.11)
RDW: 18.8 % — ABNORMAL HIGH (ref 11.5–15.5)
WBC: 11.6 10*3/uL — ABNORMAL HIGH (ref 4.0–10.5)

## 2014-03-31 MED ORDER — PREDNISONE 10 MG PO TABS: ORAL_TABLET | ORAL | Status: DC

## 2014-03-31 MED ORDER — POTASSIUM CHLORIDE CRYS ER 20 MEQ PO TBCR
40.0000 meq | EXTENDED_RELEASE_TABLET | Freq: Once | ORAL | Status: AC
  Administered 2014-03-31: 40 meq via ORAL
  Filled 2014-03-31: qty 2

## 2014-03-31 MED ORDER — OXYCODONE-ACETAMINOPHEN 5-325 MG PO TABS: 1.0000 | ORAL_TABLET | Freq: Four times a day (QID) | ORAL | Status: DC | PRN

## 2014-03-31 MED ORDER — ALBUTEROL SULFATE (2.5 MG/3ML) 0.083% IN NEBU: 2.5000 mg | INHALATION_SOLUTION | RESPIRATORY_TRACT | Status: DC | PRN

## 2014-03-31 MED ORDER — GUAIFENESIN ER 600 MG PO TB12: 600.0000 mg | ORAL_TABLET | Freq: Two times a day (BID) | ORAL | Status: DC

## 2014-03-31 MED ORDER — FERROUS SULFATE 325 (65 FE) MG PO TABS
325.0000 mg | ORAL_TABLET | Freq: Two times a day (BID) | ORAL | Status: DC
Start: 2014-03-31 — End: 2014-05-01

## 2014-03-31 NOTE — Progress Notes (Addendum)
Discharge instructions and prescriptions given, verbalized understanding, out in stable condition ambulatory with family. 

## 2014-03-31 NOTE — Plan of Care (Signed)
Problem: Discharge Progression Outcomes Goal: O2 sats > or equal 90% or at baseline Outcome: Completed/Met Date Met:  03/31/14 Goal: Able to self administer respiratory meds Outcome: Completed/Met Date Met:  03/31/14 Goal: Pain controlled with appropriate interventions Outcome: Completed/Met Date Met:  03/31/14 Goal: Tolerating diet Outcome: Completed/Met Date Met:  03/31/14 Goal: Activity appropriate for discharge plan Outcome: Completed/Met Date Met:  03/31/14  Problem: COPD GOLD Progrssion Goal: Able to Perform ADL's at Baseline Outcome: Completed/Met Date Met:  03/31/14

## 2014-03-31 NOTE — Progress Notes (Signed)
Inpatient Diabetes Program Recommendations  AACE/ADA: New Consensus Statement on Inpatient Glycemic Control (2013)  Target Ranges:  Prepandial:   less than 140 mg/dL      Peak postprandial:   less than 180 mg/dL (1-2 hours)      Critically ill patients:  140 - 180 mg/dL   Reason for Visit: Hyperglycemia Results for [name], [name] (MRN [id number]) as of 03/31/2014 13:27  Ref. Range 03/30/2014 11:21 03/30/2014 16:56 03/30/2014 22:04 03/31/2014 07:33 03/31/2014 11:32  Glucose-Capillary Latest Range: 70-99 mg/dL 300 (H) 215 (H) 192 (H) 139 (H) 196 (H)   CBGs still running high.  Consider increasing Novolog to resistant tidwc and hs or adding Novolog 3 units tidwc for meal coverage insulin to improve post-prandial blood sugars.  Will continue to follow. Thank you. Lorenda Peck, RD, LDN, CDE Inpatient Diabetes Coordinator [phone]

## 2014-03-31 NOTE — Plan of Care (Signed)
Problem: Phase I Progression Outcomes Goal: Other Phase II Outcomes/Goals Outcome: Completed/Met Date Met:  03/31/14  Problem: Phase II Progression Outcomes Goal: O2 sats > equal to 90% on RA or at baseline Outcome: Adequate for Discharge Goal: Pain controlled on oral analgesia Outcome: Completed/Met Date Met:  03/31/14 Goal: Dyspnea controlled w/progressive activity Outcome: Completed/Met Date Met:  03/31/14 Goal: ADLs completed with minimal assistance Outcome: Completed/Met Date Met:  03/31/14 Goal: Activity at appropriate level-compared to baseline (UP IN CHAIR FOR HEMODIALYSIS)  Outcome: Completed/Met Date Met:  03/31/14 Goal: Tolerating diet Outcome: Completed/Met Date Met:  03/31/14 Goal: Discharge plan remains appropriate-arrangements made Outcome: Completed/Met Date Met:  03/31/14 Goal: Other Phase II Outcomes/Goals Outcome: Completed/Met Date Met:  03/31/14  Problem: COPD GOLD Progrssion Goal: ABLE TO WEAN TO ROOM AIR Outcome: Completed/Met Date Met:  03/31/14 Goal: Able to Perform ADL's at Baseline Outcome: Adequate for Discharge Goal: Provide COPD GOLD Action Plan at Discharge Outcome: Completed/Met Date Met:  03/31/14 Goal: Arrange DC Appointments for Pulmonary Clinic & PCP Arrange Discharge Appointments for Pulmonary Clinic and Primary Care Physician  Outcome: Completed/Met Date Met:  03/31/14 Goal: Identify Patient Has COPD GOLD Card Identify Patient has COPD GOLD Card (if not, issue card to patient)  Outcome: Completed/Met Date Met:  03/31/14 Goal: Other COPD GOLD Goal(s) Outcome: Completed/Met Date Met:  03/31/14

## 2014-03-31 NOTE — Plan of Care (Signed)
Problem: Discharge Progression Outcomes Goal: Dyspnea controlled Outcome: Completed/Met Date Met:  03/31/14 Goal: O2 sats > or equal 90% or at baseline Outcome: Adequate for Discharge Goal: Home O2 if indicated Outcome: Completed/Met Date Met:  03/31/14 Goal: Able to self administer respiratory meds Outcome: Adequate for Discharge Goal: Flu vaccine received if indicated Outcome: Not Applicable Date Met:  74/12/87 Goal: Pneumonia vaccine received if indicated Outcome: Not Applicable Date Met:  86/76/72 Goal: Independent ADLs or Home Health Care Outcome: Completed/Met Date Met:  03/31/14 Goal: Barriers To Progression Addressed/Resolved Outcome: Completed/Met Date Met:  03/31/14 Goal: Discharge plan in place and appropriate Outcome: Completed/Met Date Met:  03/31/14 Goal: Pain controlled with appropriate interventions Outcome: Adequate for Discharge Goal: Tolerating diet Outcome: Adequate for Discharge Goal: Activity appropriate for discharge plan Outcome: Adequate for Discharge Goal: Hemodynamically stable Outcome: Completed/Met Date Met:  09/47/09 Goal: Complications resolved/controlled Outcome: Completed/Met Date Met:  03/31/14 Goal: Other Discharge Outcomes/Goals Outcome: Completed/Met Date Met:  03/31/14

## 2014-03-31 NOTE — Plan of Care (Signed)
Problem: Phase II Progression Outcomes Goal: O2 sats > equal to 90% on RA or at baseline Outcome: Completed/Met Date Met:  03/31/14

## 2014-04-07 ENCOUNTER — Encounter: Payer: Self-pay | Admitting: Family Medicine

## 2014-04-07 MED ORDER — PREGABALIN 150 MG PO CAPS: 150.0000 mg | ORAL_CAPSULE | Freq: Two times a day (BID) | ORAL | Status: DC

## 2014-04-07 MED ORDER — ROFLUMILAST 500 MCG PO TABS: 500.0000 ug | ORAL_TABLET | Freq: Every day | ORAL | Status: DC

## 2014-04-07 NOTE — Telephone Encounter (Signed)
Medication called to pharmacy per MD.  

## 2014-04-08 ENCOUNTER — Encounter: Payer: Self-pay | Admitting: Internal Medicine

## 2014-04-08 ENCOUNTER — Ambulatory Visit (INDEPENDENT_AMBULATORY_CARE_PROVIDER_SITE_OTHER): Payer: Medicare HMO | Admitting: Internal Medicine

## 2014-04-08 VITALS — BP 126/80 | HR 111 | Temp 98.4°F | Ht 64.0 in | Wt 288.0 lb

## 2014-04-08 DIAGNOSIS — F17210 Nicotine dependence, cigarettes, uncomplicated: Secondary | ICD-10-CM

## 2014-04-08 DIAGNOSIS — R058 Other specified cough: Secondary | ICD-10-CM

## 2014-04-08 DIAGNOSIS — Z720 Tobacco use: Secondary | ICD-10-CM

## 2014-04-08 DIAGNOSIS — J4531 Mild persistent asthma with (acute) exacerbation: Secondary | ICD-10-CM

## 2014-04-08 DIAGNOSIS — R05 Cough: Secondary | ICD-10-CM

## 2014-04-08 MED ORDER — OXYCODONE-ACETAMINOPHEN 5-325 MG PO TABS: 1.0000 | ORAL_TABLET | Freq: Four times a day (QID) | ORAL | Status: DC | PRN

## 2014-04-08 NOTE — Patient Instructions (Signed)
The key is to stop smoking completely before smoking completely stops you!   See calendar for specific medication instructions and bring it back for each and every office visit for every healthcare provider you see.  Without it,  you may not receive the best quality medical care that we feel you deserve.  You will note that the calendar groups together  your maintenance  medications that are timed at particular times of the day.  Think of this as your checklist for what your doctor has instructed you to do until your next evaluation to see what benefit  there is  to staying on a consistent group of medications intended to keep you well.  The other group at the bottom is entirely up to you to use as you see fit  for specific symptoms that may arise between visits that require you to treat them on an as needed basis.  Think of this as your action plan or "what if" list.   Separating the top medications from the bottom group is fundamental to providing you adequate care going forward.    See Tammy NP w/in 2 weeks with all your medications, even over the counter meds, separated in two separate bags, the ones you take no matter what vs the ones you stop once you feel better and take only as needed when you feel you need them.   Tammy  will generate for you a new user friendly medication calendar that will put [place] all on the same page re: your medication use.     Without this process, it simply isn't possible to assure that we are providing  your outpatient care  with  the attention to detail we feel you deserve.   If we cannot assure that you're getting that kind of care,  then we cannot manage your problem effectively from this clinic.  Once you have seen Tammy and we are sure that we're all on the same page with your medication use she will arrange follow up with me.

## 2014-04-08 NOTE — Progress Notes (Signed)
Subjective:    Patient ID: [name], female    DOB: 07-01-1965  MRN: [id number]    Brief patient profile:  48 yowf active smoker with pna as 48 year old but then could keep up with classmates fine at [facility] but developed recurrent exacerbations of cough starting in 2010 and referred to pulmonary clinic 12/22/2011 by Dr Buelah Manis p multiple admissions to [facility] since Nov 2012 with dx of pna and refractory cough and sob since then.  Spirometry normal 03/18/2012   History of Present Illness  12/22/2011 1st pulmonary eval on ACEI  cc doe x walking outside in heat but does ok indoors, very sedentary, also  bad cough when not taking cough medications coughs so hard she vomits, more productive in am thick yellow, never bloody. Not using nebs x sev weeks.  rec Prilosec Take 30-60 min before first meal of the day and Zantac 150 mg one at bedtime Stop lisinopril and start benicar 20 mg one daily (ok to take one half if too strong) - you should be able to taper off your cough medication soon  Stop advair and spiriva Dulera 100 Take 2 puffs first thing in am and then another 2 puffs about 12 hours later.  Only use your albuterol as a rescue medication  .  Please schedule a follow up office visit in 4 weeks, sooner if needed with pfts and cxr> did not return                      02/03/2013 f/u ov/[name] last smoked 01/29/13 but on 02 24h 2.5 on 40 mg pred per day  Chief Complaint  Patient presents with  . Follow-up    Pt states dxed with PNA and MRSA recently and was d/c'ed from [facility] on 01/20/13. She c/o having increased SOB, chest tightness and prod cough with very minimal yellow sputum.   using neb every 4 hours due to cough and sob with min acitvty but not much response rec Plan A Dulera 100 Take 2 puffs first thing in am and then another 2 puffs about 12 hours later  Prilosec 40 mg Take 30-60 min before first meal of the day and Pepcid 20 mg one at bedtime  For breathing  Plan B Only use your  albuterol as a rescue medication   Plan C is your nebulizer ok to use nebulizer up to every 4 hours but my hope is you won't need it at all For cough mucinex dm 1200 mg every 12 hours supplement with percocet up to every 4 hours if needed plus the flutter valve GERD  diet See Tammy NP w/in 2 weeks with all your medications > did not return     ... but then could keep up with classmates fine at [facility] but developed recurrent exacerbations of cough starting in 2010 and referred to pulmonary clinic 12/22/2011 by Dr Buelah Manis p multiple admissions to [facility] since Nov 2012 with dx of pna and refractory cough and sob since then.  Spirometry normal 03/18/2012   History of Present Illness  12/22/2011 1st pulmonary eval on ACEI  cc doe x walking outside in heat but does ok indoors, very sedentary, also  bad cough when not taking cough medications coughs so hard she vomits, more productive in am thick yellow, never bloody. Not using nebs x sev weeks.  rec Prilosec Take 30-60 min before first meal of the day and Zantac 150 mg one at bedtime Stop lisinopril and start benicar 20 mg one daily (ok to take one half if too strong) - you should be able to taper off your cough medication soon  Stop advair and spiriva Dulera 100 Take 2 puffs first thing in am and then another 2 puffs about 12 hours later.  Only use your albuterol as a rescue medication  .  Please schedule a follow up office visit in 4 weeks, sooner if needed with pfts and cxr> did not return                      02/03/2013 f/u ov/[name] last smoked 01/29/13 but on 02 24h 2.5 on 40 mg pred per day  Chief Complaint  Patient presents with  . Follow-up    Pt states dxed with PNA and MRSA recently and was d/c'ed from [facility] on 01/20/13. She c/o having increased SOB, chest tightness and prod cough with very minimal yellow sputum.   using neb every 4 hours due to cough and sob with min acitvty but not much response rec Plan A Dulera 100 Take 2 puffs first thing in am and then another 2 puffs about 12 hours later  Prilosec 40 mg Take 30-60 min before first meal of the day and Pepcid 20 mg one at bedtime  For breathing  Plan B Only use your  albuterol as a rescue medication   Plan C is your nebulizer ok to use nebulizer up to every 4 hours but my hope is you won't need it at all For cough mucinex dm 1200 mg every 12 hours supplement with percocet up to every 4 hours if needed plus the flutter valve GERD  diet See Tammy NP w/in 2 weeks with all your medications > did not return     02/27/2014 acute /extended re-establish  ov/[name] re: AB/ still smoking  Chief Complaint  Patient presents with  . Acute Visit    Pt c/o increased cough- non prod, SOB, wheezing, and chest tighness on and off for the past 2-3 months. Using albuterol neb at least once per day.   day > noct hacking cough non productive   >Prilosec 40 mg Take 30-60 min before first meal of the day and Pepcid 20 mg one at bedtime Stop flonase and clariton and dulera  For allergies, For drainage take chlortrimeton (chlorpheniramine) 4 mg every 4 hours available over the counter (may cause drowsiness)   For cough mucinex dm 1200 mg every 12 hours supplement with percocet up to every 4 hours if needed plus the  flutter valve as much as possible  For breathing >> Only use your albuterol neb as a rescue medication  GERD  Diet       03/05/2014 Follow up and Medication Review  AB/ still smoking  Returns for 2 week follow up .  We reviewed all her medications organize them into a medication count with patient education. Patient has a recent change of her insurance and several medications are not currently being covered.  She has an office visit with her primary care physician next week and plans to discuss this. Last visit. Patient was started on aggressive treatment for cough, reflux and postnasal drip. Last visit was instructed to stop her Dulera and claritin.  She is using Percocet for cough control She says that her cough is much improved She did not start Chlor-Trimeton as she still not find this at the store. Follow med calendar and bring to each ov   03/13/2014  f/u ov/Esme Durkin re: still smoking  With  severe cough x 3 y/ no med calendar, changes made by Dr Harrington Challenger in [place] / no flutter on hand  Chief Complaint  Patient presents with  . Follow-up    Pt states that her breathing is slightly better "still get winded doing anything".  Has not had to use the neb at all since last visit. Cough is unchanged since last visit.    harsh dry barking day > noct not using flutter, no worse since ran out of opioids  rec Stop losartan Start bystolic 5 mg one daily  For drainage take chlortrimeton (chlorpheniramine) 4 mg every 4 hours available over the counter (may cause drowsiness)  For cough use the delsym 2 tsp every 12 hours and the flutter valve as much as possible  Admit 12/2-8/15 with ae AB  04/08/2014 post f/u ov/Tiffani Kadow re: AB/ refractory cough/ still smoking  Chief Complaint  Patient presents with  . HFU    Pt states discharged from [facility] on 03/31/14 after having COPD exacerbation. She states that she felt like she had started to improve after d/c'ed, but c/o sore throat, chest tightness, increased SOB and cough x 2-3 days. She states that cough is prod, mainly in the am with large amounts of white to yellow sputum.   prednisone stopped one day prior to OV  But but noted first became worse on < 20 mg with cough and sob Did bring calendar, not all meds have been refilled on it though, missing opioids/daliresp/lyrica "I'm getting them filled" saba not helping, not using neb  No obvious day to day or daytime variabilty or assoc  or chest tightness, subjective wheeze overt sinus or hb symptoms. No unusual exp hx or h/o childhood pna/ asthma or knowledge of premature birth.  Sleeping ok without nocturnal  or early am exacerbation  of respiratory  c/o's or need for noct saba. Also denies any obvious fluctuation of symptoms with weather or environmental changes or other aggravating or alleviating factors except as outlined above   Current Medications, Allergies,  Complete Past Medical History, Past Surgical History, Family History, and Social History were reviewed in [employer] record.  ROS  The following are not active complaints unless bolded sore throat, dysphagia, dental problems, itching, sneezing,  nasal congestion or excess/ purulent secretions, ear ache,   fever, chills, sweats, unintended wt loss, pleuritic or exertional cp, hemoptysis,  orthopnea pnd or leg swelling, presyncope, palpitations, heartburn, abdominal pain, anorexia, nausea, vomiting, diarrhea  or change in bowel or urinary  habits, change in stools or urine, dysuria,hematuria,  rash, arthralgias/ was on pain contract, visual complaints, headache, numbness weakness or ataxia or problems with walking or coordination,  change in mood/affect or memory.                   Objective:   Physical Exam  amb obese / slt cushingnoid wf nad   03/18/2012 wt  265 > 284 02/03/2013 > 02/27/2014  301> 293 03/05/2014 > 03/13/2014  295 > 04/08/14 288    HEENT: nl dentition, turbinates, and orophanx. Nl external ear canals without cough reflex   NECK :  without JVD/Nodes/TM/ nl carotid upstrokes bilaterally   LUNGS: no acc muscle use, clear to  A and P    CV:  RRR  no s3 or murmur or increase in P2, no edema   ABD:  soft and nontender with nl excursion in the supine position. No bruits or organomegaly, bowel sounds nl  MS:  warm without deformities, calf tenderness, cyanosis or clubbing    03/25/14 pCXR Cardiomegaly without acute disease.           Assessment & Plan:

## 2014-04-09 NOTE — Assessment & Plan Note (Signed)
Classic Upper airway cough syndrome, so named because it's frequently impossible to sort out how much is  CR/sinusitis with freq throat clearing (which can be related to primary GERD)   vs  causing  secondary (" extra esophageal")  GERD from wide swings in gastric pressure that occur with throat clearing, often  promoting self use of mint and menthol lozenges that reduce the lower esophageal sphincter tone and exacerbate the problem further in a cyclical fashion.   These are the same pts (now being labeled as having "irritable larynx syndrome" by some cough centers) who not infrequently have a history of having failed to tolerate ace inhibitors,  dry powder inhalers or biphosphonates or report having atypical reflux symptoms that don't respond to standard doses of PPI , and are easily confused as having aecopd or asthma flares by even experienced allergists/ pulmonologists.  For now continue max acid rx/ add back oxycodone to eliminate cyclical cough in short run only / emphasized use of flutter

## 2014-04-09 NOTE — Assessment & Plan Note (Signed)
Reports really only better while in hosp but this is the only time she wasn't smoking     > 3 min discussion  I emphasized that although we never turn away smokers from the pulmonary clinic, we do ask that they understand that the recommendations that we make  won't work nearly as well in the presence of continued cigarette exposure.  In fact, we may very well  reach a point where we can't promise to help the patient if she can't quit smoking. (We can and will promise to try to help, we just can't promise what we recommend will really work)

## 2014-04-09 NOTE — Assessment & Plan Note (Addendum)
-   PFT's 03/18/2012 > spirometry nl   - HFA 90%p coaching 03/13/14 -med calendar 03/05/2014 > 03/13/14 did not return with it - 04/08/14 reviewed med calendar line by line  DDX of  difficult airways management all start with A and  include Adherence, Ace Inhibitors, Acid Reflux, Active Sinus Disease, Alpha 1 Antitripsin deficiency, Anxiety masquerading as Airways dz,  ABPA,  allergy(esp in young), Aspiration (esp in elderly), Adverse effects of DPI,  Active smokers, plus two Bs  = Bronchiectasis and Beta blocker use..and one C= CHF  Adherence is always the initial "prime suspect" and is a multilayered concern that requires a "trust but verify" approach in every patient - starting with knowing how to use medications, especially inhalers, correctly, keeping up with refills and understanding the fundamental difference between maintenance and prns vs those medications only taken for a very short course and then stopped and not refilled.    Each maintenance medication was reviewed in detail including most importantly the difference between maintenance and as needed and under what circumstances the prns are to be used. This was done in the context of a medication calendar review which provided the patient with a user-friendly unambiguous mechanism for medication administration and reconciliation and provides an action plan for all active problems. It is critical that this be shown to every doctor  for modification during the office visit if necessary so the patient can use it as a working document.       Active smoking > discussed separately  Allergy/ asthma > doubt, hold further prednisone / no real indication for daliresp here but continue until next ov to see if can keep her out of hospital  ? Acid (or non-acid) GERD > always difficult to exclude as up to 75% of pts in some series report no assoc GI/ Heartburn symptoms> rec continue max (24h)  acid suppression and diet restrictions/ reviewed

## 2014-04-12 NOTE — Discharge Summary (Signed)
Physician Discharge Summary  [name]:[id number] DOB: 1966-04-11 DOA: 03/25/2014  PCP: Vic Blackbird, MD  Admit date: 03/25/2014 Discharge date: 03/31/2014  Time spent: 40 minutes  Recommendations for Outpatient Follow-up:  1. Follow-up with primary pulmonologist in the next week as previously scheduled.  Discharge Diagnoses:  Active Problems:   Microcytic anemia   GERD (gastroesophageal reflux disease)   HTN (hypertension)   DM (diabetes mellitus) type II controlled, neurological manifestation   Cigarette smoker   Depression   Morbid obesity   Respiratory distress   Chest pain on respiration   Leukocytosis   Acute on chronic respiratory failure with hypoxia   Discharge Condition: Improved  Diet recommendation: Low-salt, low carb  Filed Weights   03/25/14 1542 03/25/14 1958  Weight: 131.543 kg (290 lb) 129.4 kg (285 lb 4.4 oz)    History of present illness:  This patient presented to the emergency room with complaints of cough and shortness of breath that began approximately 3 days prior to admission. Her symptoms are progressively getting worse. Cough was productive. It was associated with pleuritic chest pain. She was treated with albuterol the emergency room and was given a dose of IV steroids. She reported that she continued to smoke. She is admitted for further treatments.  Hospital Course:  Patient was continued on intravenous steroids, bronchodilators and antibiotics her hospital stay. With high-dose steroids, her respiratory status did improve and she is now breathing comfortably on room air. She does get mildly short of breath on exertion, but at rest is maintaining saturations greater than 90% on room air. She appears to be approaching her baseline. She was placed on a prednisone taper and continued on bronchodilators at home. She plans to follow-up with her primary pulmonologist, next week. She was strongly advised to quit smoking. The remainder of her  medical issues remained stable.  Procedures:    Consultations:    Discharge Exam: Filed Vitals:   03/31/14 1314  BP: 140/63  Pulse: 90  Temp: 98.4 F (36.9 C)  Resp: 20    General: No acute distress Cardiovascular: S1, S2, regular rate and rhythm Respiratory: Diminished breath sounds with minimal wheezes bilaterally  Discharge Instructions   Discharge Instructions    Call MD for:  difficulty breathing, headache or visual disturbances    Complete by:  As directed      Call MD for:  temperature >100.4    Complete by:  As directed      Diet - low sodium heart healthy    Complete by:  As directed      Diet Carb Modified    Complete by:  As directed      Increase activity slowly    Complete by:  As directed           Discharge Medication List as of 03/31/2014  3:33 PM    START taking these medications   Details  ferrous sulfate 325 (65 FE) MG tablet Take 1 tablet (325 mg total) by mouth 2 (two) times daily with a meal., Starting 03/31/2014, Until Discontinued, Print    guaiFENesin (MUCINEX) 600 MG 12 hr tablet Take 1 tablet (600 mg total) by mouth 2 (two) times daily., Starting 03/31/2014, Until Discontinued, Print    oxyCODONE-acetaminophen (PERCOCET/ROXICET) 5-325 MG per tablet Take 1-2 tablets by mouth every 6 (six) hours as needed for moderate pain., Starting 03/31/2014, Until Discontinued, Print    predniSONE (DELTASONE) 10 MG tablet Take 40mg  po daily for 2 days then 30mg  po  daily for 2 days then 20mg  po daily for 2 days then 10mg  po daily for 2 days then stop, Print      CONTINUE these medications which have CHANGED   Details  albuterol (PROVENTIL) (2.5 MG/3ML) 0.083% nebulizer solution Take 3 mLs (2.5 mg total) by nebulization every 4 (four) hours as needed for wheezing., Starting 03/31/2014, Until Discontinued, Print      CONTINUE these medications which have NOT CHANGED   Details  albuterol (PROVENTIL HFA) 108 (90 BASE) MCG/ACT inhaler Inhale 2 puffs into  the lungs every 4 (four) hours as needed for wheezing or shortness of breath., Until Discontinued, Historical Med    ALPRAZolam (XANAX) 0.5 MG tablet Take 1 tablet (0.5 mg total) by mouth 3 (three) times daily as needed for sleep or anxiety., Starting 03/09/2014, Until Tue 03/09/15, Print    atorvastatin (LIPITOR) 10 MG tablet Take 10 mg by mouth at bedtime., Until Discontinued, Historical Med    chlorpheniramine (CHLOR-TRIMETON) 4 MG tablet Take 4 mg by mouth every 4 (four) hours as needed for allergies., Until Discontinued, Historical Med    DULoxetine (CYMBALTA) 60 MG capsule Take 1 capsule (60 mg total) by mouth daily., Starting 03/09/2014, Until Tue 03/09/15, Normal    famotidine (PEPCID) 20 MG tablet Take 20 mg by mouth at bedtime., Until Discontinued, Historical Med    methylphenidate (RITALIN) 20 MG tablet Take 1 tablet (20 mg total) by mouth 2 (two) times daily with breakfast and lunch., Starting 03/09/2014, Until Discontinued, Print    ofloxacin (FLOXIN) 0.3 % otic solution Place 5 drops into both ears daily as needed (ear pain). , Until Discontinued, Historical Med    pantoprazole (PROTONIX) 40 MG tablet Take 1 tablet (40 mg total) by mouth daily., Starting 03/09/2014, Until Discontinued, Normal    sitaGLIPtin (JANUVIA) 100 MG tablet Take 1 tablet (100 mg total) by mouth daily., Starting 03/09/2014, Until Discontinued, Normal    losartan (COZAAR) 50 MG tablet Take 1 tablet (50 mg total) by mouth daily., Starting 02/27/2014, Until Discontinued, Normal    metoCLOPramide (REGLAN) 5 MG tablet Take 5 mg by mouth every 6 (six) hours as needed for nausea., Until Discontinued, Historical Med    pregabalin (LYRICA) 150 MG capsule Take 1 capsule (150 mg total) by mouth 2 (two) times daily., Starting 02/19/2014, Until Discontinued, Phone In    roflumilast (DALIRESP) 500 MCG TABS tablet Take 1 tablet (500 mcg total) by mouth daily., Starting 02/19/2014, Until Discontinued, Normal     ibuprofen (ADVIL,MOTRIN) 200 MG tablet Take 200 mg by mouth every 6 (six) hours as needed (pain). Per bottle as needed for joint pain, Until Discontinued, Historical Med    loperamide (IMODIUM A-D) 2 MG tablet Take 2 mg by mouth 3 (three) times daily as needed for diarrhea or loose stools. , Until Discontinued, Historical Med      STOP taking these medications     oxyCODONE-acetaminophen (PERCOCET) 10-325 MG per tablet        Allergies  Allergen Reactions  . Codeine Itching and Nausea Only  . Wellbutrin [Bupropion Hcl] Hives   Follow-up Information    Follow up with Christinia Gully, MD. Go on 04/08/2014.   Specialty:  Pulmonary Disease   Why:  At 2:15pm   Contact information:   [address] [place] [address] [phone]       Follow up with Vic Blackbird, MD. Go on 04/15/2014.   Specialty:  Family Medicine   Why:  At 2:15pm   Contact  information:   [address] [place] [address] [phone]        The results of significant diagnostics from this hospitalization (including imaging, microbiology, ancillary and laboratory) are listed below for reference.    Significant Diagnostic Studies: Dg Chest Portable 1 View  03/25/2014   CLINICAL DATA:  Shortness of breath.  EXAM: PORTABLE CHEST - 1 VIEW  COMPARISON:  PA and lateral chest 01/19/2014.  CT chest 01/10/2013.  FINDINGS: There is cardiomegaly without edema. Lungs are clear. No pneumothorax or pleural effusion.  IMPRESSION: Cardiomegaly without acute disease.   Electronically Signed   By: Inge Rise M.D.   On: 03/25/2014 16:06    Microbiology: No results found for this or any previous visit (from the past 240 hour(s)).   Labs: Basic Metabolic Panel: No results for input(s): NA, K, CL, CO2, GLUCOSE, BUN, CREATININE, CALCIUM, MG, PHOS in the last 168 hours. Liver Function Tests: No results for input(s): AST, ALT, ALKPHOS, BILITOT, PROT, ALBUMIN in the last 168 hours. No results for input(s):  LIPASE, AMYLASE in the last 168 hours. No results for input(s): AMMONIA in the last 168 hours. CBC: No results for input(s): WBC, NEUTROABS, HGB, HCT, MCV, PLT in the last 168 hours. Cardiac Enzymes: No results for input(s): CKTOTAL, CKMB, CKMBINDEX, TROPONINI in the last 168 hours. BNP: BNP (last 3 results)  Recent Labs  10/18/13 1722 03/25/14 1539  PROBNP 264.4* 562.3*   CBG: No results for input(s): GLUCAP in the last 168 hours.     Signed:  Daley Gosse  [facility] 04/12/2014, 7:37 PM

## 2014-04-13 ENCOUNTER — Telehealth (HOSPITAL_COMMUNITY): Payer: Self-pay | Admitting: *Deleted

## 2014-04-13 NOTE — Telephone Encounter (Signed)
called pt at 8:36am 04-13-14 and phone rang and it beeped and I attempted to leave message. In the middle of leaving message it beeped again and cut off message. need to resch appt due to provider out of office

## 2014-04-15 ENCOUNTER — Inpatient Hospital Stay: Payer: Medicare HMO | Admitting: Family Medicine

## 2014-04-24 ENCOUNTER — Encounter: Payer: Self-pay | Admitting: Internal Medicine

## 2014-04-27 NOTE — Telephone Encounter (Signed)
Please advise MW thanks 

## 2014-04-28 ENCOUNTER — Encounter: Payer: Self-pay | Admitting: Adult Health

## 2014-04-28 ENCOUNTER — Ambulatory Visit (INDEPENDENT_AMBULATORY_CARE_PROVIDER_SITE_OTHER): Payer: Medicare HMO | Admitting: Adult Health

## 2014-04-28 VITALS — BP 132/78 | HR 88 | Temp 98.6°F | Ht 64.0 in | Wt 288.4 lb

## 2014-04-28 DIAGNOSIS — J4531 Mild persistent asthma with (acute) exacerbation: Secondary | ICD-10-CM

## 2014-04-28 MED ORDER — ROFLUMILAST 500 MCG PO TABS: 500.0000 ug | ORAL_TABLET | Freq: Every day | ORAL | Status: DC

## 2014-04-28 MED ORDER — BISOPROLOL FUMARATE 10 MG PO TABS: 10.0000 mg | ORAL_TABLET | Freq: Every day | ORAL | Status: DC

## 2014-04-28 NOTE — Progress Notes (Signed)
Subjective:    Patient ID: [name], female    DOB: 07-01-1965  MRN: [id number]    Brief patient profile:  65 yowf active smoker with pna as 49 year old but then could keep up with classmates fine at PE but developed recurrent exacerbations of cough starting in 2010 and referred to pulmonary clinic 12/22/2011 by Dr Buelah Manis p multiple admissions to [facility] since Nov 2012 with dx of pna and refractory cough and sob since then.  Spirometry normal 03/18/2012   History of Present Illness  12/22/2011 1st pulmonary eval on ACEI  cc doe x walking outside in heat but does ok indoors, very sedentary, also  bad cough when not taking cough medications coughs so hard she vomits, more productive in am thick yellow, never bloody. Not using nebs x sev weeks.  rec Prilosec Take 30-60 min before first meal of the day and Zantac 150 mg one at bedtime Stop lisinopril and start benicar 20 mg one daily (ok to take one half if too strong) - you should be able to taper off your cough medication soon  Stop advair and spiriva Dulera 100 Take 2 puffs first thing in am and then another 2 puffs about 12 hours later.  Only use your albuterol as a rescue medication  .  Please schedule a follow up office visit in 4 weeks, sooner if needed with pfts and cxr> did not return                      02/03/2013 f/u ov/Wert last smoked 01/29/13 but on 02 24h 2.5 on 40 mg pred per day  Chief Complaint  Patient presents with  . Follow-up    Pt states dxed with PNA and MRSA recently and was d/c'ed from [facility] on 01/20/13. She c/o having increased SOB, chest tightness and prod cough with very minimal yellow sputum.   using neb every 4 hours due to cough and sob with min acitvty but not much response rec Plan A Dulera 100 Take 2 puffs first thing in am and then another 2 puffs about 12 hours later  Prilosec 40 mg Take 30-60 min before first meal of the day and Pepcid 20 mg one at bedtime  For breathing  Plan B Only use your  albuterol as a rescue medication   Plan C is your nebulizer ok to use nebulizer up to every 4 hours but my hope is you won't need it at all For cough mucinex dm 1200 mg every 12 hours supplement with percocet up to every 4 hours if needed plus the flutter valve GERD  diet See Tammy NP w/in 2 weeks with all your medications > did not return     02/27/2014 acute /extended re-establish  ov/Wert re: AB/ still smoking  Chief Complaint  Patient presents with  . Acute Visit    Pt c/o increased cough- non prod, SOB, wheezing, and chest tighness on and off for the past 2-3 months. Using albuterol neb at least once per day.   day > noct hacking cough non productive   >Prilosec 40 mg Take 30-60 min before first meal of the day and Pepcid 20 mg one at bedtime Stop flonase and clariton and dulera  For allergies, For drainage take chlortrimeton (chlorpheniramine) 4 mg every 4 hours available over the counter (may cause drowsiness)   For cough mucinex dm 1200 mg every 12 hours supplement with percocet up to every 4 hours if needed plus the  flutter valve as much as possible  For breathing >> Only use your albuterol neb as a rescue medication  GERD  Diet       03/05/2014 Follow up and Medication Review  AB/ still smoking  Returns for 2 week follow up .  We reviewed all her medications organize them into a medication count with patient education. Patient has a recent change of her insurance and several medications are not currently being covered.  She has an office visit with her primary care physician next week and plans to discuss this. Last visit. Patient was started on aggressive treatment for cough, reflux and postnasal drip. Last visit was instructed to stop her Dulera and claritin.  She is using Percocet for cough control She says that her cough is much improved She did not start Chlor-Trimeton as she still not find this at the store. Follow med calendar and bring to each ov   03/13/2014  f/u ov/Wert re: still smoking  With  severe cough x 3 y/ no med calendar, changes made by Dr Harrington Challenger in [place] / no flutter on hand  Chief Complaint  Patient presents with  . Follow-up    Pt states that her breathing is slightly better "still get winded doing anything".  Has not had to use the neb at all since last visit. Cough is unchanged since last visit.    harsh dry barking day > noct not using flutter, no worse since ran out of opioids  rec Stop losartan Start bystolic 5 mg one daily  For drainage take chlortrimeton (chlorpheniramine) 4 mg every 4 hours available over the counter (may cause drowsiness)  For cough use the delsym 2 tsp every 12 hours and the flutter valve as much as possible  Admit 12/2-8/15 with ae AB  04/08/2014 post f/u ov/Wert re: AB/ refractory cough/ still smoking  Chief Complaint  Patient presents with  . HFU    Pt states discharged from [facility] on 03/31/14 after having COPD exacerbation. She states that she felt like she had started to improve after d/c'ed, but c/o sore throat, chest tightness, increased SOB and cough x 2-3 days. She states that cough is prod, mainly in the am with large amounts of white to yellow sputum.   prednisone stopped one day prior to OV  But but noted first became worse on < 20 mg with cough and sob >no changes   04/28/2014 Follow up and Med review AB/ refractory cough/ still smoking  Returns for 2 week follow up and med review  Still smoking -smoking cessation discussed  We reviewed all her medications organize them into a medication count with patient education. Reports breathing/cough are improved about 25%.   Has not had PFT yet.  Patient denies any chest pain, orthopnea, PND or leg swelling.   Current Medications, Allergies, Complete Past Medical History, Past Surgical History, Family History, and Social History were reviewed in [employer] record.  ROS  The following are not active complaints unless  bolded sore throat, dysphagia, dental problems, itching, sneezing,  nasal congestion or excess/ purulent secretions, ear ache,   fever, chills, sweats, unintended wt loss, pleuritic or exertional cp, hemoptysis,  orthopnea pnd or leg swelling, presyncope, palpitations, heartburn, abdominal pain, anorexia, nausea, vomiting, diarrhea  or change in bowel or urinary habits, change in stools or urine, dysuria,hematuria,  rash, , visual complaints, headache, numbness weakness or ataxia or problems with walking or coordination,  change in mood/affect or memory.  Objective:   Physical Exam  amb obese / slt cushingnoid wf nad   03/18/2012 wt  265 > 284 02/03/2013 > 02/27/2014  301> 293 03/05/2014 > 03/13/2014  295 > 04/08/14 288> 04/28/2014 288     HEENT: nl dentition, turbinates, and orophanx. Nl external ear canals without cough reflex   NECK :  without JVD/Nodes/TM/ nl carotid upstrokes bilaterally   LUNGS: no acc muscle use, clear to  A and P    CV:  RRR  no s3 or murmur or increase in P2, no edema   ABD:  soft and nontender with nl excursion in the supine position. No bruits or organomegaly, bowel sounds nl  MS:  warm without deformities, calf tenderness, cyanosis or clubbing    03/25/14 pCXR Cardiomegaly without acute disease.           Assessment & Plan:

## 2014-04-28 NOTE — Patient Instructions (Signed)
Follow med calendar closely and bring to each visit.  Follow up Dr. Melvyn Novas  In 4 weeks with PFT  Please contact office for sooner follow up if symptoms do not improve or worsen or seek emergency care

## 2014-04-28 NOTE — Assessment & Plan Note (Signed)
Slowly resolving flare in smoker with upper airway cough  Previous spirometry nml , no recent PFT  Control triggers for cough w/ gerd and drainage Patient's medications were reviewed today and patient education was given. Computerized medication calendar was adjusted/completed    Plan  Follow med calendar closely and bring to each visit.  Follow up Dr. Melvyn Novas  In 4 weeks with PFT  Please contact office for sooner follow up if symptoms do not improve or worsen or seek emergency care

## 2014-05-01 ENCOUNTER — Ambulatory Visit (INDEPENDENT_AMBULATORY_CARE_PROVIDER_SITE_OTHER): Payer: Medicare HMO | Admitting: Family Medicine

## 2014-05-01 ENCOUNTER — Encounter: Payer: Self-pay | Admitting: Family Medicine

## 2014-05-01 VITALS — BP 128/84 | HR 76 | Temp 98.6°F | Resp 14 | Ht 64.0 in | Wt 292.0 lb

## 2014-05-01 DIAGNOSIS — G629 Polyneuropathy, unspecified: Secondary | ICD-10-CM

## 2014-05-01 DIAGNOSIS — M545 Low back pain, unspecified: Secondary | ICD-10-CM

## 2014-05-01 DIAGNOSIS — D509 Iron deficiency anemia, unspecified: Secondary | ICD-10-CM

## 2014-05-01 DIAGNOSIS — I10 Essential (primary) hypertension: Secondary | ICD-10-CM

## 2014-05-01 DIAGNOSIS — E1149 Type 2 diabetes mellitus with other diabetic neurological complication: Secondary | ICD-10-CM

## 2014-05-01 DIAGNOSIS — G4730 Sleep apnea, unspecified: Secondary | ICD-10-CM

## 2014-05-01 DIAGNOSIS — F17210 Nicotine dependence, cigarettes, uncomplicated: Secondary | ICD-10-CM

## 2014-05-01 DIAGNOSIS — Z720 Tobacco use: Secondary | ICD-10-CM

## 2014-05-01 DIAGNOSIS — J4531 Mild persistent asthma with (acute) exacerbation: Secondary | ICD-10-CM

## 2014-05-01 DIAGNOSIS — R140 Abdominal distension (gaseous): Secondary | ICD-10-CM

## 2014-05-01 DIAGNOSIS — R0981 Nasal congestion: Secondary | ICD-10-CM

## 2014-05-01 NOTE — Patient Instructions (Addendum)
Stop your iron tablet, take Ferralet 90 1 tablet daily Stop the plain allergy pill Take the Norel  AD 1 tablet twice  A day for the next 3 days to decongest We will call with lab results Schedule eye exam You need to QUIT smoking New pain referral to [facility] F/U 3  months

## 2014-05-02 LAB — MICROALBUMIN / CREATININE URINE RATIO
Creatinine, Urine: 225.8 mg/dL
Microalb Creat Ratio: 6.2 mg/g (ref 0.0–30.0)
Microalb, Ur: 1.4 mg/dL (ref <2.0)

## 2014-05-02 LAB — CBC WITH DIFFERENTIAL/PLATELET
Basophils Absolute: 0 10*3/uL (ref 0.0–0.1)
Basophils Relative: 0 % (ref 0–1)
Eosinophils Absolute: 0.1 10*3/uL (ref 0.0–0.7)
Eosinophils Relative: 1 % (ref 0–5)
HCT: 34.2 % — ABNORMAL LOW (ref 36.0–46.0)
Hemoglobin: 10 g/dL — ABNORMAL LOW (ref 12.0–15.0)
Lymphocytes Relative: 26 % (ref 12–46)
Lymphs Abs: 3.8 10*3/uL (ref 0.7–4.0)
MCH: 21 pg — ABNORMAL LOW (ref 26.0–34.0)
MCHC: 29.2 g/dL — ABNORMAL LOW (ref 30.0–36.0)
MCV: 71.7 fL — ABNORMAL LOW (ref 78.0–100.0)
MPV: 9.2 fL (ref 8.6–12.4)
Monocytes Absolute: 1 10*3/uL (ref 0.1–1.0)
Monocytes Relative: 7 % (ref 3–12)
Neutro Abs: 9.6 10*3/uL — ABNORMAL HIGH (ref 1.7–7.7)
Neutrophils Relative %: 66 % (ref 43–77)
Platelets: 258 10*3/uL (ref 150–400)
RBC: 4.77 MIL/uL (ref 3.87–5.11)
RDW: 20 % — ABNORMAL HIGH (ref 11.5–15.5)
WBC: 14.6 10*3/uL — ABNORMAL HIGH (ref 4.0–10.5)

## 2014-05-02 LAB — COMPREHENSIVE METABOLIC PANEL
ALT: 10 U/L (ref 0–35)
AST: 11 U/L (ref 0–37)
Albumin: 3.7 g/dL (ref 3.5–5.2)
Alkaline Phosphatase: 51 U/L (ref 39–117)
BUN: 10 mg/dL (ref 6–23)
CO2: 25 mEq/L (ref 19–32)
Calcium: 8.8 mg/dL (ref 8.4–10.5)
Chloride: 105 mEq/L (ref 96–112)
Creat: 0.55 mg/dL (ref 0.50–1.10)
Glucose, Bld: 112 mg/dL — ABNORMAL HIGH (ref 70–99)
Potassium: 3.8 mEq/L (ref 3.5–5.3)
Sodium: 139 mEq/L (ref 135–145)
Total Bilirubin: 0.4 mg/dL (ref 0.2–1.2)
Total Protein: 6.3 g/dL (ref 6.0–8.3)

## 2014-05-02 LAB — LIPID PANEL
Cholesterol: 134 mg/dL (ref 0–200)
HDL: 44 mg/dL (ref >39)
LDL Cholesterol: 74 mg/dL (ref 0–99)
Total CHOL/HDL Ratio: 3 Ratio
Triglycerides: 79 mg/dL (ref <150)
VLDL: 16 mg/dL (ref 0–40)

## 2014-05-02 LAB — HEMOGLOBIN A1C
Hgb A1c MFr Bld: 7.2 % — ABNORMAL HIGH (ref <5.7)
Mean Plasma Glucose: 160 mg/dL — ABNORMAL HIGH (ref <117)

## 2014-05-02 LAB — IRON: Iron: 16 ug/dL — ABNORMAL LOW (ref 42–145)

## 2014-05-03 DIAGNOSIS — R140 Abdominal distension (gaseous): Secondary | ICD-10-CM | POA: Insufficient documentation

## 2014-05-03 DIAGNOSIS — R0981 Nasal congestion: Secondary | ICD-10-CM | POA: Insufficient documentation

## 2014-05-03 NOTE — Assessment & Plan Note (Signed)
CBG reading high recently, last A1C 6.9%,on Januvia, recheck A1C

## 2014-05-03 NOTE — Assessment & Plan Note (Signed)
Well controlled 

## 2014-05-03 NOTE — Assessment & Plan Note (Signed)
Continue lyrica 

## 2014-05-03 NOTE — Assessment & Plan Note (Signed)
Chronic back pain, DDD, referral to new pain clinic

## 2014-05-03 NOTE — Assessment & Plan Note (Signed)
Down to 1/2ppd,chronic cough, SOB will not improve if she does not quit smoking

## 2014-05-03 NOTE — Assessment & Plan Note (Signed)
Will treat with Norel AD, given meds from office for next 3 days, then restart her regular allergy meds

## 2014-05-03 NOTE — Assessment & Plan Note (Signed)
Trial of Ferralet to see if slow release iron with vitamin C helps with digestion, bloating and bowels If not better schedule with her GI

## 2014-05-03 NOTE — Progress Notes (Signed)
Patient ID: [name], female   DOB: February 03, 1966, 49 y.o.   MRN: [id number]   Subjective:    Patient ID: [name], female    DOB: Jul 22, 1965, 49 y.o.   MRN: [id number]  Patient presents for 3 month F/U; Ear Stuffiness; and Bloating  Pt here to f/u multiple concerns  - Needs new referral to pain clinic last one did not accept her. States she is taking ibuprofen for pain,also on Lyrica   - Nasal congestion and ear stuffiness, placed on chlorpheniramine by by pulmonary does not think it helps, flonase was also discontinued.  - bloated feeling for past few months since she started iron, denies hard stools, has had a few days of constipation - still followed by psychiatry but continues to be fatigued despite Ritalin     Review Of Systems:  GEN- denies fatigue, fever, weight loss,weakness, recent illness HEENT- denies eye drainage, change in vision, nasal discharge, CVS- denies chest pain, palpitations RESP- denies SOB,+ cough, wheeze ABD- denies N/V, change in stools, abd pain GU- denies dysuria, hematuria, dribbling, incontinence MSK- + joint pain, muscle aches, injury Neuro- denies headache, dizziness, syncope, seizure activity       Objective:    BP 128/84 mmHg  Pulse 76  Temp(Src) 98.6 F (37 C) (Oral)  Resp 14  Ht 5\' 4"  (1.626 m)  Wt 292 lb (132.45 kg)  BMI 50.10 kg/m2 GEN- NAD, alert and oriented x3, obese HEENT- PERRL, EOMI, non injected sclera, pink conjunctiva, MMM, oropharynx clear, chronic performation right TM no drainage, left canal clear, TM clear, +nasal congestion, rhinorhea Neck- Supple, no LAD CVS- RRR, no murmur RESP-few scatterd wheeze, no rhonchi, normal WOB,  Psych- flat depressed affect ( baseline) not anxious appearing, no SI, normal speech EXT- No edema Pulses- Radial 2+     Assessment & Plan:      Problem List Items Addressed This Visit      Unprioritized   HTN (hypertension) - Primary (Chronic)   Relevant Orders      CBC  with Differential (Completed)      Comprehensive metabolic panel (Completed)      Lipid panel (Completed)   DM (diabetes mellitus) type II controlled, neurological manifestation   Relevant Orders      Hemoglobin A1c (Completed)      Microalbumin / creatinine urine ratio (Completed)   Asthmatic bronchitis    Other Visit Diagnoses    Anemia, iron deficiency        Relevant Medications       Fe Cbn-Fe Gluc-FA-B12-C-DSS (FERRALET 90) 90-1 MG TABS    Other Relevant Orders       CBC with Differential (Completed)       Comprehensive metabolic panel (Completed)       Iron (Completed)       Note: This dictation was prepared with Dragon dictation along with smaller phrase technology. Any transcriptional errors that result from this process are unintentional.

## 2014-05-11 ENCOUNTER — Ambulatory Visit (HOSPITAL_COMMUNITY): Payer: Self-pay | Admitting: Psychiatry

## 2014-05-14 ENCOUNTER — Ambulatory Visit (HOSPITAL_COMMUNITY): Payer: Self-pay | Admitting: Psychiatry

## 2014-05-18 ENCOUNTER — Encounter: Payer: Self-pay | Admitting: Family Medicine

## 2014-05-26 ENCOUNTER — Telehealth: Payer: Self-pay | Admitting: *Deleted

## 2014-05-26 ENCOUNTER — Other Ambulatory Visit: Payer: Self-pay | Admitting: Internal Medicine

## 2014-05-26 ENCOUNTER — Ambulatory Visit: Payer: Self-pay | Admitting: Internal Medicine

## 2014-05-26 MED ORDER — PREGABALIN 150 MG PO CAPS: 150.0000 mg | ORAL_CAPSULE | Freq: Two times a day (BID) | ORAL | Status: DC

## 2014-05-26 NOTE — Telephone Encounter (Signed)
Okay to refill give 3 

## 2014-05-26 NOTE — Telephone Encounter (Signed)
Received fax requesting refill on Lyrica.  Ok to refill??  Last office visit 05/01/2014.  Last refill 04/07/2014.

## 2014-05-26 NOTE — Telephone Encounter (Signed)
Medication called to pharmacy. 

## 2014-06-01 ENCOUNTER — Encounter (HOSPITAL_COMMUNITY): Payer: Self-pay | Admitting: Psychiatry

## 2014-06-01 ENCOUNTER — Ambulatory Visit (INDEPENDENT_AMBULATORY_CARE_PROVIDER_SITE_OTHER): Payer: Medicare HMO | Admitting: Psychiatry

## 2014-06-01 VITALS — BP 137/85 | HR 113 | Ht 64.0 in | Wt 286.8 lb

## 2014-06-01 DIAGNOSIS — F332 Major depressive disorder, recurrent severe without psychotic features: Secondary | ICD-10-CM

## 2014-06-01 DIAGNOSIS — F1910 Other psychoactive substance abuse, uncomplicated: Secondary | ICD-10-CM

## 2014-06-01 DIAGNOSIS — F411 Generalized anxiety disorder: Secondary | ICD-10-CM

## 2014-06-01 DIAGNOSIS — F331 Major depressive disorder, recurrent, moderate: Secondary | ICD-10-CM

## 2014-06-01 MED ORDER — DULOXETINE HCL 60 MG PO CPEP: 60.0000 mg | ORAL_CAPSULE | Freq: Every day | ORAL | Status: DC

## 2014-06-01 MED ORDER — ALPRAZOLAM 0.5 MG PO TABS: 0.5000 mg | ORAL_TABLET | Freq: Every day | ORAL | Status: DC

## 2014-06-01 MED ORDER — AMPHETAMINE-DEXTROAMPHETAMINE 20 MG PO TABS: 20.0000 mg | ORAL_TABLET | Freq: Two times a day (BID) | ORAL | Status: DC
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
## 2014-06-24 ENCOUNTER — Encounter: Payer: Self-pay | Admitting: Family Medicine

## 2014-06-29 ENCOUNTER — Ambulatory Visit (HOSPITAL_COMMUNITY): Payer: Self-pay | Admitting: Psychiatry

## 2014-07-02 ENCOUNTER — Encounter (HOSPITAL_COMMUNITY): Payer: Self-pay | Admitting: Psychiatry

## 2014-07-02 ENCOUNTER — Other Ambulatory Visit: Payer: Self-pay | Admitting: Internal Medicine

## 2014-07-02 ENCOUNTER — Ambulatory Visit (INDEPENDENT_AMBULATORY_CARE_PROVIDER_SITE_OTHER): Payer: Medicare HMO | Admitting: Psychiatry

## 2014-07-02 VITALS — Ht 64.0 in | Wt 288.0 lb

## 2014-07-02 DIAGNOSIS — F1919 Other psychoactive substance abuse with unspecified psychoactive substance-induced disorder: Secondary | ICD-10-CM

## 2014-07-02 DIAGNOSIS — F331 Major depressive disorder, recurrent, moderate: Secondary | ICD-10-CM

## 2014-07-02 DIAGNOSIS — F411 Generalized anxiety disorder: Secondary | ICD-10-CM

## 2014-07-02 MED ORDER — AMPHETAMINE-DEXTROAMPHETAMINE 20 MG PO TABS: 20.0000 mg | ORAL_TABLET | Freq: Two times a day (BID) | ORAL | Status: DC

## 2014-07-02 MED ORDER — DULOXETINE HCL 60 MG PO CPEP: 60.0000 mg | ORAL_CAPSULE | Freq: Every day | ORAL | Status: DC
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
## 2014-07-02 NOTE — Telephone Encounter (Signed)
Patient appears to be last seen here 2012. Will call in 30 days worth, but needs an appointment. Was a no show for her last appointment. Please reach out to schedule

## 2014-07-03 ENCOUNTER — Encounter: Payer: Self-pay | Admitting: Gastroenterology

## 2014-07-03 NOTE — Telephone Encounter (Signed)
APPOINTMENT MADE AND LETTER SENT °

## 2014-07-21 ENCOUNTER — Ambulatory Visit: Payer: Medicare HMO | Admitting: Nurse Practitioner

## 2014-07-21 ENCOUNTER — Telehealth: Payer: Self-pay | Admitting: Gastroenterology

## 2014-07-21 ENCOUNTER — Encounter: Payer: Self-pay | Admitting: Nurse Practitioner

## 2014-07-21 NOTE — Telephone Encounter (Signed)
PATIENT WAS A NO SHOW 07/21/14 AND LETTER SENT

## 2014-07-24 NOTE — Telephone Encounter (Signed)
Noted  

## 2014-08-06 ENCOUNTER — Other Ambulatory Visit: Payer: Self-pay | Admitting: *Deleted

## 2014-08-06 MED ORDER — PANTOPRAZOLE SODIUM 40 MG PO TBEC: 40.0000 mg | DELAYED_RELEASE_TABLET | Freq: Every day | ORAL | Status: DC

## 2014-08-06 NOTE — Telephone Encounter (Signed)
Received fax requesting refill on Pantoprazole.   Refill appropriate and filled per protocol. 

## 2014-08-10 ENCOUNTER — Telehealth (HOSPITAL_COMMUNITY): Payer: Self-pay | Admitting: *Deleted

## 2014-08-10 NOTE — Telephone Encounter (Signed)
Pt pharmacy requesting refills for pt Xanax. Pt was last in for f/u visit 07-02-14. But her medication was last filled 06-01-14 with 30 tablets 2 refills. Pt pharmacy number is [phone]

## 2014-08-11 NOTE — Telephone Encounter (Signed)
She should have had enough refills for March and April. Please call her to inquire

## 2014-08-14 NOTE — Telephone Encounter (Signed)
Called pt and she stated that she still have the script. Informed pt that she should call her pharmacy in reference to this and pt agreed.

## 2014-08-27 ENCOUNTER — Encounter: Payer: Self-pay | Admitting: Family Medicine

## 2014-08-27 MED ORDER — SITAGLIPTIN PHOSPHATE 100 MG PO TABS: 100.0000 mg | ORAL_TABLET | Freq: Every day | ORAL | Status: DC

## 2014-08-31 ENCOUNTER — Ambulatory Visit (INDEPENDENT_AMBULATORY_CARE_PROVIDER_SITE_OTHER): Payer: Medicare HMO | Admitting: Family Medicine

## 2014-08-31 ENCOUNTER — Encounter: Payer: Self-pay | Admitting: Family Medicine

## 2014-08-31 VITALS — BP 128/64 | HR 68 | Temp 98.5°F | Resp 16 | Ht 64.0 in | Wt 292.0 lb

## 2014-08-31 DIAGNOSIS — I10 Essential (primary) hypertension: Secondary | ICD-10-CM

## 2014-08-31 DIAGNOSIS — G629 Polyneuropathy, unspecified: Secondary | ICD-10-CM

## 2014-08-31 DIAGNOSIS — J4531 Mild persistent asthma with (acute) exacerbation: Secondary | ICD-10-CM

## 2014-08-31 DIAGNOSIS — J9611 Chronic respiratory failure with hypoxia: Secondary | ICD-10-CM

## 2014-08-31 DIAGNOSIS — E1149 Type 2 diabetes mellitus with other diabetic neurological complication: Secondary | ICD-10-CM | POA: Diagnosis not present

## 2014-08-31 DIAGNOSIS — L0293 Carbuncle, unspecified: Secondary | ICD-10-CM | POA: Diagnosis not present

## 2014-08-31 LAB — COMPREHENSIVE METABOLIC PANEL
ALT: 9 U/L (ref 0–35)
AST: 9 U/L (ref 0–37)
Albumin: 3.5 g/dL (ref 3.5–5.2)
Alkaline Phosphatase: 60 U/L (ref 39–117)
BUN: 9 mg/dL (ref 6–23)
CO2: 29 mEq/L (ref 19–32)
Calcium: 9.1 mg/dL (ref 8.4–10.5)
Chloride: 103 mEq/L (ref 96–112)
Creat: 0.68 mg/dL (ref 0.50–1.10)
Glucose, Bld: 192 mg/dL — ABNORMAL HIGH (ref 70–99)
Potassium: 3.9 mEq/L (ref 3.5–5.3)
Sodium: 139 mEq/L (ref 135–145)
Total Bilirubin: 0.4 mg/dL (ref 0.2–1.2)
Total Protein: 6.3 g/dL (ref 6.0–8.3)

## 2014-08-31 LAB — CBC WITH DIFFERENTIAL/PLATELET
Basophils Absolute: 0 10*3/uL (ref 0.0–0.1)
Basophils Relative: 0 % (ref 0–1)
Eosinophils Absolute: 0.1 10*3/uL (ref 0.0–0.7)
Eosinophils Relative: 1 % (ref 0–5)
HCT: 33.8 % — ABNORMAL LOW (ref 36.0–46.0)
Hemoglobin: 10.2 g/dL — ABNORMAL LOW (ref 12.0–15.0)
Lymphocytes Relative: 19 % (ref 12–46)
Lymphs Abs: 2.3 10*3/uL (ref 0.7–4.0)
MCH: 21 pg — ABNORMAL LOW (ref 26.0–34.0)
MCHC: 30.2 g/dL (ref 30.0–36.0)
MCV: 69.7 fL — ABNORMAL LOW (ref 78.0–100.0)
MPV: 9.1 fL (ref 8.6–12.4)
Monocytes Absolute: 0.9 10*3/uL (ref 0.1–1.0)
Monocytes Relative: 7 % (ref 3–12)
Neutro Abs: 8.9 10*3/uL — ABNORMAL HIGH (ref 1.7–7.7)
Neutrophils Relative %: 73 % (ref 43–77)
Platelets: 311 10*3/uL (ref 150–400)
RBC: 4.85 MIL/uL (ref 3.87–5.11)
RDW: 17.9 % — ABNORMAL HIGH (ref 11.5–15.5)
WBC: 12.2 10*3/uL — ABNORMAL HIGH (ref 4.0–10.5)

## 2014-08-31 LAB — HEMOGLOBIN A1C
Hgb A1c MFr Bld: 7.8 % — ABNORMAL HIGH (ref <5.7)
Mean Plasma Glucose: 177 mg/dL — ABNORMAL HIGH (ref <117)

## 2014-08-31 MED ORDER — ROFLUMILAST 500 MCG PO TABS: 500.0000 ug | ORAL_TABLET | Freq: Every day | ORAL | Status: DC

## 2014-08-31 MED ORDER — SULFAMETHOXAZOLE-TRIMETHOPRIM 800-160 MG PO TABS: 1.0000 | ORAL_TABLET | Freq: Two times a day (BID) | ORAL | Status: DC

## 2014-08-31 MED ORDER — PREGABALIN 100 MG PO CAPS
100.0000 mg | ORAL_CAPSULE | Freq: Three times a day (TID) | ORAL | Status: DC
Start: 2014-08-31 — End: 2014-09-18

## 2014-08-31 MED ORDER — CHLORPHEN-PE-ACETAMINOPHEN 4-10-325 MG PO TABS: ORAL_TABLET | ORAL | Status: DC

## 2014-08-31 MED ORDER — FERRALET 90 90-1 MG PO TABS: ORAL_TABLET | ORAL | Status: DC

## 2014-08-31 MED ORDER — ALBUTEROL SULFATE HFA 108 (90 BASE) MCG/ACT IN AERS
2.0000 | INHALATION_SPRAY | RESPIRATORY_TRACT | Status: DC | PRN
Start: 2014-08-31 — End: 2014-09-18

## 2014-08-31 MED ORDER — FLUCONAZOLE 150 MG PO TABS: 150.0000 mg | ORAL_TABLET | Freq: Once | ORAL | Status: DC

## 2014-08-31 NOTE — Progress Notes (Signed)
Patient ID: [name], female   DOB: 1965-10-02, 49 y.o.   MRN: [id number]   Subjective:    Patient ID: [name], female    DOB: 1965-12-23, 49 y.o.   MRN: [id number]  Patient presents for F/U  here to follow chronic medical problems. She states that her sugars have been running high they've been in the 200s fasting. She is taking her medication as prescribed. She has known peripheral neuropathy as well as chronic pain however she felt a drug test there for a pain clinic would not prescribe anything. She would like to try to take her Lyrica 3 times a day to see if this would help.   She is history of asthma bronchitis and continues to smoke. She missed her follow-up appointment with the lung doctor she was supposed to have a pulmonary function test done.   Chronic abdominal pain. No bowel she has follow-up with her gastroenterologist she continues to have these issues as well as nausea.  She requests referral to eye doctor.   She has noticed that she is getting boils on her abdomen and beneath her breasts sometimes in her axilla and they're coming more often. She is not using any type of antibacterial soap.    Review Of Systems:  GEN- denies fatigue, fever, weight loss,weakness, recent illness HEENT- denies eye drainage, change in vision, nasal discharge, CVS- denies chest pain, palpitations RESP- denies SOB, cough, wheeze ABD- denies N/V, change in stools, abd pain GU- denies dysuria, hematuria, dribbling, incontinence MSK- + joint pain, muscle aches, injury Neuro- denies headache, dizziness, syncope, seizure activity       Objective:    BP 128/64 mmHg  Pulse 68  Temp(Src) 98.5 F (36.9 C) (Oral)  Resp 16  Ht 5\' 4"  (1.626 m)  Wt 292 lb (132.45 kg)  BMI 50.10 kg/m2  LMP 08/01/2014 GEN- NAD, alert and oriented x3 HEENT- PERRL, EOMI, non injected sclera, pink conjunctiva, MMM, oropharynx clear Neck- Supple, no LAD CVS- RRR, no  murmur RESP-CTAB ABD-NABS,soft,NT,ND Skin- 2 scabbed areas on abdomen, 2 small indurated lesions , RLQ dime size and beneath right breast, mild TTP, no fluctuance, no lesions in axilla EXT- No edema Pulses- Radial, DP- 2+        Assessment & Plan:      Problem List Items Addressed This Visit    Peripheral neuropathy   Relevant Medications   pregabalin (LYRICA) 100 MG capsule   HTN (hypertension) - Primary (Chronic)   Relevant Orders   CBC with Differential/Platelet (Completed)   Comprehensive metabolic panel (Completed)   DM (diabetes mellitus) type II controlled, neurological manifestation   Relevant Orders   Hemoglobin A1c (Completed)   Chronic respiratory failure   Asthmatic bronchitis   Relevant Medications   roflumilast (DALIRESP) 500 MCG TABS tablet   albuterol (PROVENTIL HFA) 108 (90 BASE) MCG/ACT inhaler    Other Visit Diagnoses    Recurrent boils        No lesions to I &D todayl change to anti-bacterial soap , given bactrim    Relevant Medications    sulfamethoxazole-trimethoprim (BACTRIM DS,SEPTRA DS) 800-160 MG per tablet    fluconazole (DIFLUCAN) 150 MG tablet       Note: This dictation was prepared with Dragon dictation along with smaller phrase technology. Any transcriptional errors that result from this process are unintentional.

## 2014-08-31 NOTE — Patient Instructions (Addendum)
Referral to [facility] doctor We will call with lab results Lyrica changed to three times a day Work on diet and weight loss Take antibiotics as prescribed F/U 3 months

## 2014-09-01 ENCOUNTER — Other Ambulatory Visit: Payer: Self-pay | Admitting: *Deleted

## 2014-09-01 MED ORDER — ATORVASTATIN CALCIUM 10 MG PO TABS: 10.0000 mg | ORAL_TABLET | Freq: Every day | ORAL | Status: DC

## 2014-09-01 NOTE — Assessment & Plan Note (Addendum)
Well-controlled on bisoprolol. 

## 2014-09-01 NOTE — Telephone Encounter (Signed)
Refill appropriate and filled per protocol. 

## 2014-09-01 NOTE — Assessment & Plan Note (Signed)
Currently stable, no change to meds, will f/u with pulmonary for overdue PFT Needs to quit smoking!!

## 2014-09-01 NOTE — Assessment & Plan Note (Addendum)
Goal is A1C less than 7%, I suspect this is worse, she does not watch diet, no aerobic exercise She wants to try MTF again though I am concerned as she already has chronic nausea, diarrhea/constipation Referral to eye doctor

## 2014-09-01 NOTE — Assessment & Plan Note (Signed)
Change lyrica to 100mg  TID,

## 2014-09-02 ENCOUNTER — Other Ambulatory Visit: Payer: Self-pay | Admitting: *Deleted

## 2014-09-02 ENCOUNTER — Telehealth: Payer: Self-pay | Admitting: *Deleted

## 2014-09-02 MED ORDER — CANAGLIFLOZIN 100 MG PO TABS: 100.0000 mg | ORAL_TABLET | Freq: Every day | ORAL | Status: DC

## 2014-09-02 NOTE — Telephone Encounter (Signed)
Patient returned call and made aware.

## 2014-09-02 NOTE — Telephone Encounter (Signed)
Received fax back fro [facility] doctor with appt scheduled for 11/03/14 at 2pm, lmtrc to pt for appt time/date

## 2014-09-11 ENCOUNTER — Encounter (HOSPITAL_COMMUNITY): Payer: Self-pay | Admitting: Emergency Medicine

## 2014-09-11 ENCOUNTER — Emergency Department (HOSPITAL_COMMUNITY): Payer: Medicare HMO

## 2014-09-11 ENCOUNTER — Emergency Department (HOSPITAL_COMMUNITY)
Admission: EM | Admit: 2014-09-11 | Discharge: 2014-09-12 | Disposition: A | Discharge status: Other Acute Inpatient Hospital | Payer: Medicare HMO | Attending: Emergency Medicine | Admitting: Emergency Medicine

## 2014-09-11 DIAGNOSIS — T424X2A Poisoning by benzodiazepines, intentional self-harm, initial encounter: Secondary | ICD-10-CM | POA: Diagnosis not present

## 2014-09-11 DIAGNOSIS — Z79899 Other long term (current) drug therapy: Secondary | ICD-10-CM | POA: Insufficient documentation

## 2014-09-11 DIAGNOSIS — F329 Major depressive disorder, single episode, unspecified: Secondary | ICD-10-CM | POA: Diagnosis not present

## 2014-09-11 DIAGNOSIS — Z792 Long term (current) use of antibiotics: Secondary | ICD-10-CM | POA: Insufficient documentation

## 2014-09-11 DIAGNOSIS — Z3202 Encounter for pregnancy test, result negative: Secondary | ICD-10-CM | POA: Diagnosis not present

## 2014-09-11 DIAGNOSIS — Z720 Tobacco use: Secondary | ICD-10-CM | POA: Insufficient documentation

## 2014-09-11 DIAGNOSIS — R059 Cough, unspecified: Secondary | ICD-10-CM

## 2014-09-11 DIAGNOSIS — M1990 Unspecified osteoarthritis, unspecified site: Secondary | ICD-10-CM | POA: Diagnosis not present

## 2014-09-11 DIAGNOSIS — F1210 Cannabis abuse, uncomplicated: Secondary | ICD-10-CM | POA: Diagnosis not present

## 2014-09-11 DIAGNOSIS — R05 Cough: Secondary | ICD-10-CM | POA: Insufficient documentation

## 2014-09-11 DIAGNOSIS — K219 Gastro-esophageal reflux disease without esophagitis: Secondary | ICD-10-CM | POA: Diagnosis not present

## 2014-09-11 DIAGNOSIS — D649 Anemia, unspecified: Secondary | ICD-10-CM | POA: Insufficient documentation

## 2014-09-11 DIAGNOSIS — T50902A Poisoning by unspecified drugs, medicaments and biological substances, intentional self-harm, initial encounter: Secondary | ICD-10-CM

## 2014-09-11 DIAGNOSIS — F419 Anxiety disorder, unspecified: Secondary | ICD-10-CM | POA: Diagnosis not present

## 2014-09-11 DIAGNOSIS — J441 Chronic obstructive pulmonary disease with (acute) exacerbation: Secondary | ICD-10-CM | POA: Diagnosis not present

## 2014-09-11 DIAGNOSIS — I10 Essential (primary) hypertension: Secondary | ICD-10-CM | POA: Diagnosis not present

## 2014-09-11 LAB — COMPREHENSIVE METABOLIC PANEL
ALT: 13 U/L — ABNORMAL LOW (ref 14–54)
AST: 16 U/L (ref 15–41)
Albumin: 3.8 g/dL (ref 3.5–5.0)
Alkaline Phosphatase: 61 U/L (ref 38–126)
Anion gap: 9 (ref 5–15)
BUN: 8 mg/dL (ref 6–20)
CO2: 27 mmol/L (ref 22–32)
Calcium: 8.9 mg/dL (ref 8.9–10.3)
Chloride: 103 mmol/L (ref 101–111)
Creatinine, Ser: 0.73 mg/dL (ref 0.44–1.00)
GFR calc Af Amer: >60 mL/min (ref >60)
GFR calc non Af Amer: >60 mL/min (ref >60)
Glucose, Bld: 144 mg/dL — ABNORMAL HIGH (ref 65–99)
Potassium: 4 mmol/L (ref 3.5–5.1)
Sodium: 139 mmol/L (ref 135–145)
Total Bilirubin: 0.7 mg/dL (ref 0.3–1.2)
Total Protein: 7.6 g/dL (ref 6.5–8.1)

## 2014-09-11 LAB — CBC
HCT: 41.8 % (ref 36.0–46.0)
Hemoglobin: 12.3 g/dL (ref 12.0–15.0)
MCH: 21.4 pg — ABNORMAL LOW (ref 26.0–34.0)
MCHC: 29.4 g/dL — ABNORMAL LOW (ref 30.0–36.0)
MCV: 72.7 fL — ABNORMAL LOW (ref 78.0–100.0)
Platelets: 296 10*3/uL (ref 150–400)
RBC: 5.75 MIL/uL — ABNORMAL HIGH (ref 3.87–5.11)
RDW: 18.4 % — ABNORMAL HIGH (ref 11.5–15.5)
WBC: 13 10*3/uL — ABNORMAL HIGH (ref 4.0–10.5)

## 2014-09-11 LAB — LIPASE, BLOOD: Lipase: 30 U/L (ref 22–51)

## 2014-09-11 LAB — ETHANOL: Alcohol, Ethyl (B): <5 mg/dL (ref <5)

## 2014-09-11 LAB — SALICYLATE LEVEL: Salicylate Lvl: <4 mg/dL (ref 2.8–30.0)

## 2014-09-11 LAB — ACETAMINOPHEN LEVEL: Acetaminophen (Tylenol), Serum: <10 ug/mL — ABNORMAL LOW (ref 10–30)

## 2014-09-11 MED ORDER — IPRATROPIUM-ALBUTEROL 0.5-2.5 (3) MG/3ML IN SOLN
3.0000 mL | Freq: Once | RESPIRATORY_TRACT | Status: AC
  Administered 2014-09-11: 3 mL via RESPIRATORY_TRACT
  Filled 2014-09-11: qty 3

## 2014-09-11 MED ORDER — ONDANSETRON HCL 4 MG PO TABS: 4.0000 mg | ORAL_TABLET | Freq: Three times a day (TID) | ORAL | Status: DC | PRN

## 2014-09-11 MED ORDER — ACETAMINOPHEN 325 MG PO TABS
650.0000 mg | ORAL_TABLET | ORAL | Status: DC | PRN
  Administered 2014-09-12: 650 mg via ORAL
  Filled 2014-09-11: qty 2

## 2014-09-11 MED ORDER — NICOTINE 21 MG/24HR TD PT24
21.0000 mg | MEDICATED_PATCH | Freq: Once | TRANSDERMAL | Status: DC
  Administered 2014-09-11: 21 mg via TRANSDERMAL
  Filled 2014-09-11: qty 1

## 2014-09-11 MED ORDER — ONDANSETRON HCL 4 MG/2ML IJ SOLN
4.0000 mg | Freq: Once | INTRAMUSCULAR | Status: AC
Start: 2014-09-11 — End: 2014-09-11
  Administered 2014-09-11: 4 mg via INTRAVENOUS
  Filled 2014-09-11: qty 2

## 2014-09-11 MED ORDER — SODIUM CHLORIDE 0.9 % IV BOLUS (SEPSIS)
500.0000 mL | Freq: Once | INTRAVENOUS | Status: AC
  Administered 2014-09-11: 500 mL via INTRAVENOUS

## 2014-09-11 MED ORDER — SODIUM CHLORIDE 0.9 % IV SOLN
INTRAVENOUS | Status: DC
  Administered 2014-09-11: 500 mL via INTRAVENOUS

## 2014-09-11 MED ORDER — SODIUM CHLORIDE 0.9 % IV BOLUS (SEPSIS): 500.0000 mL | Freq: Once | INTRAVENOUS | Status: DC

## 2014-09-11 NOTE — ED Notes (Signed)
Patient arrives from home with c/o SI and overdose on Xanax 0.5 mg tabs. Per EMS, family states 75 tabs are missing, bottles arrive to ED empty. Per patient she took 5 this morning and then after a nap she took approximately 50 tabs later. Unable to tell me a time of ingestion of largest quantity. Patient Drowsy, but arouses to voice.

## 2014-09-11 NOTE — ED Notes (Signed)
Resp therapist paged for breathing treatment.  

## 2014-09-11 NOTE — ED Provider Notes (Addendum)
CSN: [id number]     Arrival date & time 09/11/14  1752 History   First MD Initiated Contact with Patient 09/11/14 1758     Chief Complaint  Patient presents with  . Drug Overdose  . V70.1     (Consider location/radiation/quality/duration/timing/severity/associated sxs/prior Treatment) Patient is a 49 y.o. female presenting with Overdose. The history is provided by the patient and the EMS personnel.  Drug Overdose Associated symptoms include shortness of breath. Pertinent negatives include no chest pain, no abdominal pain and no headaches.   brought in by EMS for overdose. Patient denies suicidal ideation here however admitted to nurse upon arrival. Patient states that she normally is on Xanax 0.5 mg tabs took 50 of them at 2 in the afternoon took some others in the morning. EMS states that 75 tabs are missing. Patient denies taking anything else. Patient drowsy but is alert and will follow commands. Patient denies headache abdominal pain nausea or vomiting. Does admit to some congestion and some shortness of breath. Patient does have history of COPD and asthma and normally uses albuterol inhaler.  Past Medical History  Diagnosis Date  . Diabetes mellitus   . Asthmatic bronchitis     normal PFT/ seen by pulmonary no evidence of COPD  . HTN (hypertension)   . Low back pain   . Tachycardia     never had test done since no insurance  . Depression   . Shortness of breath   . Anxiety   . Gastric erosions     EGD 08/2010.  . Internal hemorrhoids     Colonoscopy 5/12.  [name] Heavy menses   . Chronic respiratory failure with hypoxia     On 2-3 L of oxygen at home  . GERD (gastroesophageal reflux disease)   . Anemia   . Arthritis   . Sleep apnea   . COPD (chronic obstructive pulmonary disease)    Past Surgical History  Procedure Laterality Date  . Cholecystectomy  1990  . Cesarean section      twice  . Kidney surgery      as child for blockages  . Tubal ligation    . Tonsillectomy     . Wrist surgery  1995    Lt wrist  . Tympanostomy tube placement    . Hysteroscopy with thermachoice  01/17/2012    Procedure: HYSTEROSCOPY WITH THERMACHOICE;  Surgeon: Florian Buff, MD;  Location: [facility];  Service: Gynecology;  Laterality: N/A;  total therapy time: 9:13sec  D5W  18 ml in, D5W   36ml out, temperture 87degrees celcious  . Uterine ablation    . Colonoscopy  09/16/2010    PTW:SFKCLE colon/small internal hemorrhoids  . Esophagogastroduodenoscopy  09/16/2010    SLF: normal/mild gastritis   Family History  Problem Relation Age of Onset  . Heart attack Father 29    deceased, etoh use  . Heart disease Father   . Alcohol abuse Father   . Depression Father   . Heart attack Mother 50    deceased  . Diabetes Mother   . Breast cancer Mother   . Heart failure Mother     oxygen dependence, nonsmoker  . Heart disease Mother   . Depression Mother   . Cancer Mother   . Colon cancer Neg Hx   . Liver disease Maternal Aunt 20    died while on liver transplant list  . Heart attack Maternal Grandmother     premature CAD  . Ulcers Sister   .  Hypertension Sister    History  Substance Use Topics  . Smoking status: Current Every Day Smoker -- 0.50 packs/day for 30 years    Types: Cigarettes  . Smokeless tobacco: Never Used     Comment: using the vapor cigarettes  . Alcohol Use: 0.0 oz/week    0 Standard drinks or equivalent per week     Comment: social use   OB History    Gravida Para Term Preterm AB TAB SAB Ectopic Multiple Living   2 2 2       2      Review of Systems  Constitutional: Negative for fever.  HENT: Positive for congestion.   Eyes: Negative for photophobia and visual disturbance.  Respiratory: Positive for shortness of breath and wheezing.   Cardiovascular: Negative for chest pain.  Gastrointestinal: Negative for nausea, vomiting and abdominal pain.  Genitourinary: Negative for dysuria.  Musculoskeletal: Negative for back pain.  Skin: Negative for rash.   Neurological: Negative for headaches.  Hematological: Does not bruise/bleed easily.  Psychiatric/Behavioral: Negative for confusion. The patient is nervous/anxious.       Allergies  Codeine; Metformin and related; and Wellbutrin  Home Medications   Prior to Admission medications   Medication Sig Start Date End Date Taking? Authorizing Provider  albuterol (PROVENTIL HFA) 108 (90 BASE) MCG/ACT inhaler Inhale 2 puffs into the lungs every 4 (four) hours as needed for wheezing or shortness of breath. 08/31/14  Yes Alycia Rossetti, MD  ALPRAZolam Duanne Moron) 0.5 MG tablet Take 1 tablet (0.5 mg total) by mouth at bedtime. 06/01/14 06/01/15 Yes Cloria Spring, MD  amphetamine-dextroamphetamine (ADDERALL) 20 MG tablet Take 1 tablet (20 mg total) by mouth 2 (two) times daily. 07/02/14 07/02/15 Yes Cloria Spring, MD  atorvastatin (LIPITOR) 10 MG tablet Take 1 tablet (10 mg total) by mouth at bedtime. 09/01/14  Yes Alycia Rossetti, MD  bisoprolol (ZEBETA) 10 MG tablet Take 1 tablet (10 mg total) by mouth daily. 04/28/14  Yes Tammy S Parrett, NP  canagliflozin (INVOKANA) 100 MG TABS tablet Take 1 tablet (100 mg total) by mouth daily. 09/02/14  Yes Alycia Rossetti, MD  DULoxetine (CYMBALTA) 60 MG capsule Take 1 capsule (60 mg total) by mouth daily. 07/02/14 07/02/15 Yes Cloria Spring, MD  Fe Cbn-Fe Gluc-FA-B12-C-DSS (FERRALET 90) 90-1 MG TABS Take 1 tab daily Patient taking differently: Take 1 tablet by mouth daily. Take 1 tab daily 08/31/14  Yes Alycia Rossetti, MD  pantoprazole (PROTONIX) 40 MG tablet Take 1 tablet (40 mg total) by mouth daily. 08/06/14  Yes Alycia Rossetti, MD  pregabalin (LYRICA) 100 MG capsule Take 1 capsule (100 mg total) by mouth 3 (three) times daily. 08/31/14  Yes Alycia Rossetti, MD  roflumilast (DALIRESP) 500 MCG TABS tablet Take 1 tablet (500 mcg total) by mouth daily. 08/31/14  Yes Alycia Rossetti, MD  sitaGLIPtin (JANUVIA) 100 MG tablet Take 1 tablet (100 mg total) by mouth daily. 08/27/14   Yes Alycia Rossetti, MD  albuterol (PROVENTIL) (2.5 MG/3ML) 0.083% nebulizer solution Take 3 mLs (2.5 mg total) by nebulization every 4 (four) hours as needed for wheezing. 03/31/14   Kathie Dike, MD  Chlorphen-PE-Acetaminophen (NOREL AD) 4-10-325 MG TABS 1 PO bid PRN 08/31/14   Alycia Rossetti, MD  dextromethorphan-guaiFENesin [facility] DM) 30-600 MG per 12 hr tablet Take 1 tablet by mouth every 12 (twelve) hours as needed (w/ flutter).     Historical Provider, MD  famotidine (PEPCID) 20 MG tablet  take 1 tablet by mouth at bedtime 07/02/14   Carlis Stable, NP  fluconazole (DIFLUCAN) 150 MG tablet Take 1 tablet (150 mg total) by mouth once. 08/31/14   Alycia Rossetti, MD  ibuprofen (ADVIL,MOTRIN) 200 MG tablet Per bottle as needed for joint pain    Historical Provider, MD  Respiratory Therapy Supplies (FLUTTER) DEVI Use as directed    Historical Provider, MD  sulfamethoxazole-trimethoprim (BACTRIM DS,SEPTRA DS) 800-160 MG per tablet Take 1 tablet by mouth 2 (two) times daily. 08/31/14   Alycia Rossetti, MD   BP 110/69 mmHg  Pulse 118  Temp(Src) 98.4 F (36.9 C) (Oral)  Resp 14  Ht 5\' 4"  (1.626 m)  Wt 292 lb (132.45 kg)  BMI 50.10 kg/m2  SpO2 90%  LMP 08/01/2014 Physical Exam  Constitutional: She is oriented to person, place, and time. She appears well-developed and well-nourished. No distress.  HENT:  Head: Normocephalic and atraumatic.  Mouth/Throat: Oropharynx is clear and moist.  Eyes: Conjunctivae and EOM are normal. Pupils are equal, round, and reactive to light.  Neck: Normal range of motion. Neck supple.  Cardiovascular: Normal rate, regular rhythm and normal heart sounds.   No murmur heard. Pulmonary/Chest: Effort normal. She has wheezes.  Abdominal: Soft. Bowel sounds are normal. There is no tenderness.  Musculoskeletal: Normal range of motion.  Neurological: She is alert and oriented to person, place, and time. No cranial nerve deficit. She exhibits normal muscle tone.  Coordination normal.  Skin: Skin is warm. No rash noted.  Nursing note and vitals reviewed.   ED Course  Procedures (including critical care time) Labs Review Labs Reviewed  CBC - Abnormal; Notable for the following:    WBC 13.0 (*)    RBC 5.75 (*)    MCV 72.7 (*)    MCH 21.4 (*)    MCHC 29.4 (*)    RDW 18.4 (*)    All other components within normal limits  COMPREHENSIVE METABOLIC PANEL - Abnormal; Notable for the following:    Glucose, Bld 144 (*)    ALT 13 (*)    All other components within normal limits  ACETAMINOPHEN LEVEL - Abnormal; Notable for the following:    Acetaminophen (Tylenol), Serum <10 (*)    All other components within normal limits  ETHANOL  SALICYLATE LEVEL  URINE RAPID DRUG SCREEN (HOSP PERFORMED)  URINALYSIS, ROUTINE W REFLEX MICROSCOPIC  PREGNANCY, URINE   Results for orders placed or performed during the hospital encounter of 09/11/14  CBC  Result Value Ref Range   WBC 13.0 (H) 4.0 - 10.5 K/uL   RBC 5.75 (H) 3.87 - 5.11 MIL/uL   Hemoglobin 12.3 12.0 - 15.0 g/dL   HCT 41.8 36.0 - 46.0 %   MCV 72.7 (L) 78.0 - 100.0 fL   MCH 21.4 (L) 26.0 - 34.0 pg   MCHC 29.4 (L) 30.0 - 36.0 g/dL   RDW 18.4 (H) 11.5 - 15.5 %   Platelets 296 150 - 400 K/uL  Comprehensive metabolic panel  Result Value Ref Range   Sodium 139 135 - 145 mmol/L   Potassium 4.0 3.5 - 5.1 mmol/L   Chloride 103 101 - 111 mmol/L   CO2 27 22 - 32 mmol/L   Glucose, Bld 144 (H) 65 - 99 mg/dL   BUN 8 6 - 20 mg/dL   Creatinine, Ser 0.73 0.44 - 1.00 mg/dL   Calcium 8.9 8.9 - 10.3 mg/dL   Total Protein 7.6 6.5 - 8.1 g/dL   Albumin  3.8 3.5 - 5.0 g/dL   AST 16 15 - 41 U/L   ALT 13 (L) 14 - 54 U/L   Alkaline Phosphatase 61 38 - 126 U/L   Total Bilirubin 0.7 0.3 - 1.2 mg/dL   GFR calc non Af Amer >60 >60 mL/min   GFR calc Af Amer >60 >60 mL/min   Anion gap 9 5 - 15  Ethanol (ETOH)  Result Value Ref Range   Alcohol, Ethyl (B) <5 <5 mg/dL  Acetaminophen level  Result Value Ref Range    Acetaminophen (Tylenol), Serum <10 (L) 10 - 30 ug/mL  Salicylate level  Result Value Ref Range   Salicylate Lvl <9.7 2.8 - 30.0 mg/dL     Imaging Review Dg Chest 2 View  09/11/2014   CLINICAL DATA:  Overdose, drowsy.  Cough.  EXAM: CHEST  2 VIEW  COMPARISON:  03/25/2014  FINDINGS: Mild cardiomegaly. There is peribronchial thickening. No confluent opacities or effusions. No acute bony abnormality.  IMPRESSION: Mild cardiomegaly.  Bronchitic changes.   Electronically Signed   By: Rolm Baptise M.D.   On: 09/11/2014 19:50     EKG Interpretation   Date/Time:  Friday Sep 11 2014 17:55:09 EDT Ventricular Rate:  119 PR Interval:  118 QRS Duration: 80 QT Interval:  320 QTC Calculation: 450 R Axis:   18 Text Interpretation:  Sinus tachycardia Low voltage, extremity leads  Baseline wander in lead(s) V2 No significant change since last tracing  Confirmed by Liviya Santini  MD, Janie Strothman [phone]) on 09/11/2014 6:03:30 PM      MDM   Final diagnoses:  Cough  Overdose, intentional self-harm, initial encounter    Patient medically cleared but patient with a significant Xanax overdose states that she took approximately 50 tablets at 2 in the afternoon. Denies any suicidal ideation to me but did complain of a suicide ideation to nursing. Xanax tablet size was 0.5 mg. There are total of 75 tablets missing per EMS. Patient shows no evidence of any Tylenol or system there is no evidence of liver function test abnormality to explain a missed Tylenol overdose. Patient chest x-rays negative. Patient has a known history of COPD had some wheezing upon arrival but is now resolved. Patient will be interviewed by act team. Will order some of patient's meds that will be safe we'll hold off on the Xanax for now. Patient most likely will require inpatient admission. Patient currently is voluntary. And that would be inpatient psychiatric admission. Currently not requiring medical admission.  Patient low denied suicidal  ideation mace that she took all these pills just so that she would sleep. Does seem to be an excessive amount of medication does seem to be a serious overdose.  Also poison control was consult and that care would be symptomatic and supportive care. Patient not showing any evidence of significant drowsiness. The six-hour mark patient still quite alert. Patient not showing any significant signs of ingestion. At the six-hour mark.  Fredia Sorrow, MD 09/11/14 2121   Addendum just was contacted by [facility] they have accepted her for psychiatric inpatient care. Patient will be transferred there. Accepted by Dr. Thornton Park, MD 09/11/14 2127

## 2014-09-11 NOTE — ED Provider Notes (Signed)
Pt is very upset that she is not allowed to leave.  I saw her get out of the bed and fall to the ground.  She is complaining of back pain.  Mild ttp.  No step off.  Doubt that patient would be able to sustain a fracture based on the fall that I witnessed.  Will continue to observe.  Dorie Rank, MD 09/11/14 [phone]
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
## 2014-09-11 NOTE — ED Notes (Signed)
Spoke with Colletta Maryland at poison control.  Care is symptomatic and supportive care.  Pt may become hypotensive,  Bradycardic,  Confused, may have lethargy and dilated pupils.  Recommend tylenol level 4 hours after ingestion.  Check electrolytes and LFT's,  EKG and do continuous cardiac monitoring.  If patient is still lethargic after 6 hours recommend to recheck electrolytes and check a  CPK level.

## 2014-09-11 NOTE — ED Notes (Signed)
Patient is crying and states that she is going to leave. Pulling at equipment and IV. States that she is going home now. Screaming and cursing at me and other staff. States that no one is going to help her and she wants to go. She does not care if she dies. MD notified and IVC papers are being taken out at this time.

## 2014-09-11 NOTE — BH Assessment (Signed)
Received TTS consult request. Spoke to Dr. Fredia Sorrow who said Pt overdosed on over 50 tabs of Xanax. Tele-assessment will be initiated.  Orpah Greek Anson Fret, [place], [facility], [facility] Triage Specialist [phone]

## 2014-09-12 ENCOUNTER — Inpatient Hospital Stay (HOSPITAL_COMMUNITY)
Admission: AD | Admit: 2014-09-12 | Discharge: 2014-09-18 | DRG: 885 | Disposition: A | Discharge status: Home / Self Care | Payer: Medicare HMO | Source: Intra-hospital | Attending: Psychiatry | Admitting: Psychiatry

## 2014-09-12 DIAGNOSIS — Z79899 Other long term (current) drug therapy: Secondary | ICD-10-CM

## 2014-09-12 DIAGNOSIS — F332 Major depressive disorder, recurrent severe without psychotic features: Secondary | ICD-10-CM | POA: Diagnosis present

## 2014-09-12 DIAGNOSIS — K219 Gastro-esophageal reflux disease without esophagitis: Secondary | ICD-10-CM | POA: Diagnosis present

## 2014-09-12 DIAGNOSIS — I10 Essential (primary) hypertension: Secondary | ICD-10-CM | POA: Diagnosis present

## 2014-09-12 DIAGNOSIS — J449 Chronic obstructive pulmonary disease, unspecified: Secondary | ICD-10-CM | POA: Diagnosis present

## 2014-09-12 DIAGNOSIS — K2970 Gastritis, unspecified, without bleeding: Secondary | ICD-10-CM | POA: Diagnosis present

## 2014-09-12 DIAGNOSIS — F17200 Nicotine dependence, unspecified, uncomplicated: Secondary | ICD-10-CM | POA: Diagnosis present

## 2014-09-12 DIAGNOSIS — T1491 Suicide attempt: Secondary | ICD-10-CM | POA: Diagnosis not present

## 2014-09-12 DIAGNOSIS — R45851 Suicidal ideations: Secondary | ICD-10-CM | POA: Diagnosis not present

## 2014-09-12 DIAGNOSIS — E119 Type 2 diabetes mellitus without complications: Secondary | ICD-10-CM | POA: Diagnosis present

## 2014-09-12 DIAGNOSIS — T50901A Poisoning by unspecified drugs, medicaments and biological substances, accidental (unintentional), initial encounter: Secondary | ICD-10-CM | POA: Diagnosis present

## 2014-09-12 DIAGNOSIS — F329 Major depressive disorder, single episode, unspecified: Secondary | ICD-10-CM | POA: Diagnosis present

## 2014-09-12 DIAGNOSIS — T424X2A Poisoning by benzodiazepines, intentional self-harm, initial encounter: Secondary | ICD-10-CM | POA: Diagnosis not present

## 2014-09-12 LAB — URINE MICROSCOPIC-ADD ON

## 2014-09-12 LAB — URINALYSIS, ROUTINE W REFLEX MICROSCOPIC
Glucose, UA: 100 mg/dL — AB
Ketones, ur: NEGATIVE mg/dL
Leukocytes, UA: NEGATIVE
Nitrite: NEGATIVE
Protein, ur: NEGATIVE mg/dL
Specific Gravity, Urine: >1.03 — ABNORMAL HIGH (ref 1.005–1.030)
Urobilinogen, UA: 0.2 mg/dL (ref 0.0–1.0)
pH: 5.5 (ref 5.0–8.0)

## 2014-09-12 LAB — PREGNANCY, URINE: Preg Test, Ur: NEGATIVE

## 2014-09-12 LAB — RAPID URINE DRUG SCREEN, HOSP PERFORMED
Amphetamines: NOT DETECTED
Barbiturates: NOT DETECTED
Benzodiazepines: POSITIVE — AB
Cocaine: NOT DETECTED
Opiates: NOT DETECTED
Tetrahydrocannabinol: POSITIVE — AB

## 2014-09-12 LAB — CBG MONITORING, ED: Glucose-Capillary: 161 mg/dL — ABNORMAL HIGH (ref 65–99)

## 2014-09-12 MED ORDER — PREGABALIN 100 MG PO CAPS
100.0000 mg | ORAL_CAPSULE | Freq: Three times a day (TID) | ORAL | Status: DC
  Administered 2014-09-13 – 2014-09-18 (×17): 100 mg via ORAL
  Filled 2014-09-12 (×17): qty 1

## 2014-09-12 MED ORDER — ATORVASTATIN CALCIUM 10 MG PO TABS
10.0000 mg | ORAL_TABLET | Freq: Every day | ORAL | Status: DC
  Administered 2014-09-12 – 2014-09-17 (×6): 10 mg via ORAL
  Filled 2014-09-12 (×9): qty 1

## 2014-09-12 MED ORDER — ACETAMINOPHEN 325 MG PO TABS
650.0000 mg | ORAL_TABLET | Freq: Four times a day (QID) | ORAL | Status: DC | PRN
  Administered 2014-09-13 – 2014-09-17 (×12): 650 mg via ORAL
  Filled 2014-09-12 (×12): qty 2

## 2014-09-12 MED ORDER — NICOTINE 21 MG/24HR TD PT24
MEDICATED_PATCH | TRANSDERMAL | Status: AC
  Administered 2014-09-12: 21 mg
  Filled 2014-09-12: qty 1

## 2014-09-12 MED ORDER — ALBUTEROL SULFATE (2.5 MG/3ML) 0.083% IN NEBU: 2.5000 mg | INHALATION_SOLUTION | RESPIRATORY_TRACT | Status: DC | PRN

## 2014-09-12 MED ORDER — IPRATROPIUM-ALBUTEROL 0.5-2.5 (3) MG/3ML IN SOLN
RESPIRATORY_TRACT | Status: AC
  Administered 2014-09-12: 3 mL
  Filled 2014-09-12: qty 3

## 2014-09-12 MED ORDER — BISOPROLOL FUMARATE 5 MG PO TABS
10.0000 mg | ORAL_TABLET | Freq: Every day | ORAL | Status: DC
  Administered 2014-09-13 – 2014-09-18 (×6): 10 mg via ORAL
  Filled 2014-09-12: qty 2
  Filled 2014-09-12 (×2): qty 1
  Filled 2014-09-12 (×6): qty 2

## 2014-09-12 MED ORDER — ROFLUMILAST 500 MCG PO TABS
500.0000 ug | ORAL_TABLET | Freq: Every day | ORAL | Status: DC
  Administered 2014-09-12: 500 ug via ORAL
  Filled 2014-09-12 (×2): qty 1

## 2014-09-12 MED ORDER — LINAGLIPTIN 5 MG PO TABS
5.0000 mg | ORAL_TABLET | Freq: Every day | ORAL | Status: DC
  Administered 2014-09-12: 5 mg via ORAL
  Filled 2014-09-12 (×2): qty 1

## 2014-09-12 MED ORDER — PANTOPRAZOLE SODIUM 40 MG PO TBEC
40.0000 mg | DELAYED_RELEASE_TABLET | Freq: Every day | ORAL | Status: DC
  Administered 2014-09-12: 40 mg via ORAL
  Filled 2014-09-12: qty 1

## 2014-09-12 MED ORDER — ALPRAZOLAM 0.5 MG PO TABS
0.5000 mg | ORAL_TABLET | Freq: Every day | ORAL | Status: DC
  Administered 2014-09-12: 0.5 mg via ORAL
  Filled 2014-09-12: qty 1

## 2014-09-12 MED ORDER — FAMOTIDINE 20 MG PO TABS: 20.0000 mg | ORAL_TABLET | Freq: Every day | ORAL | Status: DC

## 2014-09-12 MED ORDER — CANAGLIFLOZIN 100 MG PO TABS
100.0000 mg | ORAL_TABLET | Freq: Every day | ORAL | Status: DC
  Administered 2014-09-12: 100 mg via ORAL
  Filled 2014-09-12 (×2): qty 1

## 2014-09-12 MED ORDER — ALBUTEROL SULFATE HFA 108 (90 BASE) MCG/ACT IN AERS: 2.0000 | INHALATION_SPRAY | RESPIRATORY_TRACT | Status: DC | PRN

## 2014-09-12 MED ORDER — DULOXETINE HCL 60 MG PO CPEP
60.0000 mg | ORAL_CAPSULE | Freq: Every day | ORAL | Status: DC
  Administered 2014-09-13 – 2014-09-14 (×2): 60 mg via ORAL
  Filled 2014-09-12 (×4): qty 1

## 2014-09-12 MED ORDER — LINAGLIPTIN 5 MG PO TABS
5.0000 mg | ORAL_TABLET | Freq: Every day | ORAL | Status: DC
  Administered 2014-09-13 – 2014-09-18 (×6): 5 mg via ORAL
  Filled 2014-09-12 (×8): qty 1

## 2014-09-12 MED ORDER — ALUM & MAG HYDROXIDE-SIMETH 200-200-20 MG/5ML PO SUSP: 30.0000 mL | ORAL | Status: DC | PRN

## 2014-09-12 MED ORDER — ROFLUMILAST 500 MCG PO TABS
500.0000 ug | ORAL_TABLET | Freq: Every day | ORAL | Status: DC
  Administered 2014-09-13 – 2014-09-18 (×6): 500 ug via ORAL
  Filled 2014-09-12 (×8): qty 1

## 2014-09-12 MED ORDER — CANAGLIFLOZIN 100 MG PO TABS
100.0000 mg | ORAL_TABLET | Freq: Every day | ORAL | Status: DC
  Administered 2014-09-13 – 2014-09-18 (×6): 100 mg via ORAL
  Filled 2014-09-12 (×8): qty 1

## 2014-09-12 MED ORDER — AMPHETAMINE-DEXTROAMPHETAMINE 10 MG PO TABS
20.0000 mg | ORAL_TABLET | Freq: Two times a day (BID) | ORAL | Status: DC
  Administered 2014-09-12: 20 mg via ORAL
  Filled 2014-09-12: qty 2

## 2014-09-12 MED ORDER — FERRALET 90 90-1 MG PO TABS: 1.0000 | ORAL_TABLET | Freq: Every day | ORAL | Status: DC

## 2014-09-12 MED ORDER — DULOXETINE HCL 30 MG PO CPEP
60.0000 mg | ORAL_CAPSULE | Freq: Every day | ORAL | Status: DC
  Administered 2014-09-12: 60 mg via ORAL
  Filled 2014-09-12: qty 2

## 2014-09-12 MED ORDER — ATORVASTATIN CALCIUM 10 MG PO TABS
10.0000 mg | ORAL_TABLET | Freq: Every day | ORAL | Status: DC
  Filled 2014-09-12: qty 1

## 2014-09-12 MED ORDER — MAGNESIUM HYDROXIDE 400 MG/5ML PO SUSP: 30.0000 mL | Freq: Every day | ORAL | Status: DC | PRN

## 2014-09-12 MED ORDER — ALPRAZOLAM 0.5 MG PO TABS: 0.5000 mg | ORAL_TABLET | Freq: Every day | ORAL | Status: DC

## 2014-09-12 MED ORDER — BISOPROLOL FUMARATE 5 MG PO TABS
10.0000 mg | ORAL_TABLET | Freq: Every day | ORAL | Status: DC
  Administered 2014-09-12: 10 mg via ORAL
  Filled 2014-09-12 (×2): qty 1

## 2014-09-12 MED ORDER — PANTOPRAZOLE SODIUM 40 MG PO TBEC
40.0000 mg | DELAYED_RELEASE_TABLET | Freq: Every day | ORAL | Status: DC
Start: 2014-09-13 — End: 2014-09-18
  Administered 2014-09-13 – 2014-09-18 (×6): 40 mg via ORAL
  Filled 2014-09-12 (×8): qty 1

## 2014-09-12 MED ORDER — FAMOTIDINE 20 MG PO TABS
20.0000 mg | ORAL_TABLET | Freq: Every day | ORAL | Status: DC
  Administered 2014-09-12 – 2014-09-17 (×6): 20 mg via ORAL
  Filled 2014-09-12 (×9): qty 1

## 2014-09-12 MED ORDER — PREGABALIN 50 MG PO CAPS
100.0000 mg | ORAL_CAPSULE | Freq: Three times a day (TID) | ORAL | Status: DC
  Administered 2014-09-12: 100 mg via ORAL
  Filled 2014-09-12: qty 2

## 2014-09-12 NOTE — ED Notes (Signed)
Patient up to bedside commode; attempted to obtain urine specimen but patient had stool contaminating it.

## 2014-09-12 NOTE — ED Notes (Signed)
Received report on pt, pt resting with cpap in place, will arouse, sitter remains at bedside,

## 2014-09-12 NOTE — ED Notes (Signed)
Pt c/o back pain due to lying on stretcher, tylenol given per prn order

## 2014-09-12 NOTE — ED Notes (Addendum)
Report given to Sherlyn Lick at [facility],

## 2014-09-12 NOTE — ED Notes (Signed)
Pt states that family is brining her cpap machine to er,

## 2014-09-12 NOTE — ED Notes (Signed)
Attempted to call pt home number and pt daughter (emergency contact) to inquire about bringing home CPAP to pt for admission to [facility]. Message left on daughter's home answering machine to call ED when available.

## 2014-09-12 NOTE — ED Notes (Signed)
Pt awake, tearful, comfort measures provided, update given on plan of care, sitter remains at bedside,

## 2014-09-12 NOTE — ED Notes (Signed)
[facility] called for transport to [facility],

## 2014-09-12 NOTE — ED Notes (Signed)
[facility] here to transport pt,  

## 2014-09-12 NOTE — ED Notes (Signed)
Pt attempting to contact family to have someone bring her cpap machine,

## 2014-09-12 NOTE — ED Notes (Signed)
Pt eating lunch tray,   

## 2014-09-12 NOTE — ED Notes (Signed)
Patient's sats drop down to 70s and 80s when patient is asleep. Dr. Leonides Schanz and respiratory notified.

## 2014-09-12 NOTE — ED Notes (Signed)
Pt left with [facility] and 3 bags of belongings including cpap machine,

## 2014-09-12 NOTE — ED Provider Notes (Addendum)
4:15 AM  Pt's heart rate is now on the 90s. Urine shows blood but no other sign of infection. Pregnancy test negative. I feel she is medically stable and is appropriate for transfer to behavioral health hospital in the morning.  [place], DO 09/12/14 0418   5:30 AM  Asked by nursing staff to evaluate patient and she is having oxygen saturations in the 70s when she is sleeping. She has sonorous respirations and brief episodes of apnea. Able to arouse the patient easily and when she wakes up her oxygen saturation improves into the 90s. She reports that she does have a history of obstructive sleep apnea and wears C-Pap at night. Respiratory therapy will see patient and place patient on C pap in the ED. She states otherwise she does not wear oxygen in the daytime. Reports that she feels that her shortness of breath is at its baseline. She has slightly diminished breath sounds at her bases bilaterally and very minimal expiratory wheezing.  I have re-ordered her home medications including her albuterol.  [place], DO 09/12/14 [phone]

## 2014-09-12 NOTE — ED Notes (Signed)
Previous nicotine patch found in floor, refuses to stick, new patch placed left upper arm,

## 2014-09-12 NOTE — ED Notes (Signed)
[name] notified of pt's departure, advised that she was going to room 405-1

## 2014-09-13 ENCOUNTER — Encounter (HOSPITAL_COMMUNITY): Payer: Self-pay | Admitting: *Deleted

## 2014-09-13 DIAGNOSIS — R45851 Suicidal ideations: Secondary | ICD-10-CM

## 2014-09-13 DIAGNOSIS — T1491 Suicide attempt: Secondary | ICD-10-CM

## 2014-09-13 DIAGNOSIS — T424X2A Poisoning by benzodiazepines, intentional self-harm, initial encounter: Secondary | ICD-10-CM

## 2014-09-13 DIAGNOSIS — F332 Major depressive disorder, recurrent severe without psychotic features: Secondary | ICD-10-CM

## 2014-09-13 LAB — GLUCOSE, CAPILLARY
Glucose-Capillary: 127 mg/dL — ABNORMAL HIGH (ref 65–99)
Glucose-Capillary: 142 mg/dL — ABNORMAL HIGH (ref 65–99)

## 2014-09-13 MED ORDER — CHLORDIAZEPOXIDE HCL 5 MG PO CAPS
10.0000 mg | ORAL_CAPSULE | Freq: Two times a day (BID) | ORAL | Status: DC | PRN
  Administered 2014-09-13 – 2014-09-14 (×2): 10 mg via ORAL
  Filled 2014-09-13 (×2): qty 2

## 2014-09-13 MED ORDER — ARIPIPRAZOLE 2 MG PO TABS
2.0000 mg | ORAL_TABLET | Freq: Every day | ORAL | Status: DC
  Administered 2014-09-13 – 2014-09-18 (×6): 2 mg via ORAL
  Filled 2014-09-13 (×7): qty 1
  Filled 2014-09-13 (×2): qty 3
  Filled 2014-09-13: qty 1

## 2014-09-13 NOTE — H&P (Signed)
Psychiatric Admission Assessment Adult  Patient Identification: [name] MRN:  [id number] Date of Evaluation:  09/13/2014 Chief Complaint:  MDD Principal Diagnosis: Overdose Diagnosis:   Patient Active Problem List   Diagnosis Date Noted  . Overdose [T50.901A] 09/12/2014  . Nasal congestion [R09.81] 05/03/2014  . Bloating [R14.0] 05/03/2014  . Acute on chronic respiratory failure with hypoxia [J96.21]   . Chest pain on respiration [R07.1] 03/25/2014  . Upper airway cough syndrome [R05] 02/28/2014  . Diarrhea [R19.7] 10/03/2013  . Abnormal drug screen [R89.2] 04/11/2013  . Sleep apnea [G47.30] 04/11/2013  . Left knee pain [M25.562] 04/03/2013  . Hyperkalemia [E87.5] 01/18/2013  . MRSA pneumonia [J15.212] 01/14/2013  . Perforated ear drum [H72.90] 05/22/2012  . Bell's palsy [G51.0] 01/23/2012  . Carpal tunnel syndrome [G56.00] 01/23/2012  . Carpal tunnel syndrome on both sides [G56.01, G56.02] 01/11/2012  . Pulmonary infiltrates [R91.8] 12/22/2011  . Chronic respiratory failure [J96.10] 10/16/2011  . Oxygen dependent [Z99.81] 10/16/2011  . Peripheral neuropathy [G62.9] 07/07/2011  . Joint pain [M25.50] 07/06/2011  . Vitamin D deficiency [268] 07/06/2011  . Asthmatic bronchitis [J45.909] 06/11/2011  . Back pain [M54.9] 06/11/2011  . Morbid obesity [E66.01] 05/06/2011  . Depression [F32.9] 05/04/2011  . Gastric erosions [K25.9] 12/05/2010  . HTN (hypertension) [I10] 12/02/2010  . DM (diabetes mellitus) type II controlled, neurological manifestation [E11.49] 12/02/2010  . Cigarette smoker [Z72.0] 12/02/2010  . Microcytic anemia [D50.9] 09/08/2010  . GERD (gastroesophageal reflux disease) [K21.9] 09/08/2010  . Esophageal dysphagia [R13.14] 09/08/2010   History of Present Illness:: Patient presented to [facility] via EMS after intentional overdose on Xanax. Patient states that it was a suicide attempt. She reports worsening depression with crying spells, isolation, mood  swings, poor sleep including insomnia and hypersomnia which she uses to avoid feeling depressed, worsening anxiety related to multiple family problems. She states she acted impulsively due to feeling overwhelmed and her inability to cope with her children's problems. Elements:  Location:  depression/adult unit. Quality:  chronic. Severity:  moderate to severe. Timing:  over the previous 48 hours. Duration:  patient notes depression "all my life". Context:  worsening inability to cope with family problems. Associated Signs/Symptoms: Depression Symptoms:  depressed mood, anhedonia, insomnia, hypersomnia, psychomotor retardation, fatigue, feelings of worthlessness/guilt, difficulty concentrating, hopelessness, impaired memory, suicidal attempt, anxiety, loss of energy/fatigue, (Hypo) Manic Symptoms:  Impulsivity, Irritable Mood, Labiality of Mood, Anxiety Symptoms:  Excessive Worry, Psychotic Symptoms:  denies PTSD Symptoms: Negative Total Time spent with patient: 45 minutes  Past Medical History:  Past Medical History  Diagnosis Date  . Diabetes mellitus   . Asthmatic bronchitis     normal PFT/ seen by pulmonary no evidence of COPD  . HTN (hypertension)   . Low back pain   . Tachycardia     never had test done since no insurance  . Depression   . Shortness of breath   . Anxiety   . Gastric erosions     EGD 08/2010.  . Internal hemorrhoids     Colonoscopy 5/12.  [name] Heavy menses   . Chronic respiratory failure with hypoxia     On 2-3 L of oxygen at home  . GERD (gastroesophageal reflux disease)   . Anemia   . Arthritis   . Sleep apnea   . COPD (chronic obstructive pulmonary disease)     Past Surgical History  Procedure Laterality Date  . Cholecystectomy  1990  . Cesarean section      twice  . Kidney surgery  as child for blockages  . Tubal ligation    . Tonsillectomy    . Wrist surgery  1995    Lt wrist  . Tympanostomy tube placement    . Hysteroscopy  with thermachoice  01/17/2012    Procedure: HYSTEROSCOPY WITH THERMACHOICE;  Surgeon: Florian Buff, MD;  Location: [facility];  Service: Gynecology;  Laterality: N/A;  total therapy time: 9:13sec  D5W  18 ml in, D5W   9m out, temperture 87degrees celcious  . Uterine ablation    . Colonoscopy  09/16/2010    SCBJ:SEGBTDcolon/small internal hemorrhoids  . Esophagogastroduodenoscopy  09/16/2010    SLF: normal/mild gastritis   Family History:  Family History  Problem Relation Age of Onset  . Heart attack Father 461   deceased, etoh use  . Heart disease Father   . Alcohol abuse Father   . Depression Father   . Heart attack Mother 570   deceased  . Diabetes Mother   . Breast cancer Mother   . Heart failure Mother     oxygen dependence, nonsmoker  . Heart disease Mother   . Depression Mother   . Cancer Mother   . Colon cancer Neg Hx   . Liver disease Maternal AAunt 61   died while on liver transplant list  . Heart attack Maternal Grandmother     premature CAD  . Ulcers Sister   . Hypertension Sister    Social History:  History  Alcohol Use  . 0.0 oz/week  . 0 Standard drinks or equivalent per week    Comment: social use     History  Drug Use  . Yes  . Special: Marijuana    History   Social History  . Marital Status: Single    Spouse Name: N/A  . Number of Children: 2  . Years of Education: N/A   Occupational History  . unemployed    Social History Main Topics  . Smoking status: Current Every Day Smoker -- 0.50 packs/day for 30 years    Types: Cigarettes  . Smokeless tobacco: Never Used     Comment: using the vapor cigarettes  . Alcohol Use: 0.0 oz/week    0 Standard drinks or equivalent per week     Comment: social use  . Drug Use: Yes    Special: Marijuana  . Sexual Activity: No   Other Topics Concern  . None   Social History Narrative   Additional Social History:    Musculoskeletal: Strength & Muscle Tone: within normal limits Gait & Station:  normal Patient leans: normal  Psychiatric Specialty Exam: Physical Exam  Psychiatric: Her speech is normal and behavior is normal. Her mood appears anxious. Thought content is not paranoid and not delusional. Cognition and memory are normal. She expresses impulsivity and inappropriate judgment. She exhibits a depressed mood. She expresses suicidal ideation. She expresses no homicidal ideation. She expresses no suicidal plans and no homicidal plans.  Patient is seen and the chart is reviewed. I agree with the findings of the exam in the ED with no exceptions at this time.    Review of Systems  Constitutional: Positive for malaise/fatigue and diaphoresis. Negative for fever, chills and weight loss.  HENT: Negative for congestion, ear discharge, ear pain, nosebleeds and sore throat.   Eyes: Positive for blurred vision. Negative for pain, discharge and redness.  Respiratory: Positive for cough, sputum production and shortness of breath. Negative for hemoptysis and wheezing.   Cardiovascular: Positive for  chest pain and palpitations. Negative for orthopnea, claudication, leg swelling and PND.  Gastrointestinal: Positive for nausea, abdominal pain, diarrhea and constipation. Negative for heartburn, vomiting, blood in stool and melena.  Genitourinary: Positive for frequency. Negative for dysuria, urgency and hematuria.  Musculoskeletal: Positive for myalgias, back pain and joint pain.  Skin: Positive for rash (patient notes area on abdomen likely hydradenitis). Negative for itching.  Neurological: Positive for tingling (to feet and legs due to neuropathy), weakness and headaches. Negative for seizures and loss of consciousness.  Endo/Heme/Allergies: Negative.   Psychiatric/Behavioral: Positive for depression, suicidal ideas and substance abuse. Negative for hallucinations. The patient is nervous/anxious and has insomnia.     Blood pressure 147/75, pulse 94, temperature 97.5 F (36.4 C), temperature  source Oral, resp. rate 18, weight 272 lb (123.378 kg), last menstrual period 08/01/2014, SpO2 97 %.Body mass index is 46.67 kg/(m^2).  General Appearance: Disheveled  Eye Sport and exercise psychologist::  Fair  Speech:  Clear and Coherent and Slow  Volume:  Decreased  Mood:  Depressed and Dysphoric  Affect:  Congruent  Thought Process:  Coherent and Goal Directed  Orientation:  Full (Time, Place, and Person)  Thought Content:  WDL  Suicidal Thoughts:  Yes.  without intent/plan  Homicidal Thoughts:  No  Memory:  Immediate;   Fair Recent;   Fair Remote;   Fair  Judgement:  Impaired  Insight:  Shallow  Psychomotor Activity:  Decreased  Concentration:  Poor  Recall:  [place] of Knowledge:Fair  Language: Good  Akathisia:  No  Handed:  Right  AIMS (if indicated):     Assets:  Communication Skills Desire for Improvement Housing Resilience  ADL's:  Intact  Cognition: WNL  Sleep:  Number of Hours: 5.5   Risk to Self: Is patient at risk for suicide?: Yes What has been your use of drugs/alcohol within the last 12 months?: Uses marijuana 3 times a week, cannot afford daily.  Alcohol makes her stomach hurt so does not drink.  Used a lot of cocaine during one period of her life in 2003 when she was dating someone. Risk to Others:   Prior Inpatient Therapy:   Prior Outpatient Therapy:    Alcohol Screening: 1. How often do you have a drink containing alcohol?: Never 9. Have you or someone else been injured as a result of your drinking?: No 10. Has a relative or friend or a doctor or another health worker been concerned about your drinking or suggested you cut down?: No Alcohol Use Disorder Identification Test Final Score (AUDIT): 0  Allergies:   Allergies  Allergen Reactions  . Codeine Itching and Nausea Only  . Metformin And Related Diarrhea  . Wellbutrin [Bupropion Hcl] Hives   Lab Results:  Results for orders placed or performed during the hospital encounter of 09/11/14 (from the past 48 hour(s))   CBC     Status: Abnormal   Collection Time: 09/11/14  6:16 PM  Result Value Ref Range   WBC 13.0 (H) 4.0 - 10.5 K/uL   RBC 5.75 (H) 3.87 - 5.11 MIL/uL   Hemoglobin 12.3 12.0 - 15.0 g/dL   HCT 41.8 36.0 - 46.0 %   MCV 72.7 (L) 78.0 - 100.0 fL   MCH 21.4 (L) 26.0 - 34.0 pg   MCHC 29.4 (L) 30.0 - 36.0 g/dL   RDW 18.4 (H) 11.5 - 15.5 %   Platelets 296 150 - 400 K/uL  Comprehensive metabolic panel     Status: Abnormal   Collection Time:  09/11/14  6:16 PM  Result Value Ref Range   Sodium 139 135 - 145 mmol/L   Potassium 4.0 3.5 - 5.1 mmol/L   Chloride 103 101 - 111 mmol/L   CO2 27 22 - 32 mmol/L   Glucose, Bld 144 (H) 65 - 99 mg/dL   BUN 8 6 - 20 mg/dL   Creatinine, Ser 0.73 0.44 - 1.00 mg/dL   Calcium 8.9 8.9 - 10.3 mg/dL   Total Protein 7.6 6.5 - 8.1 g/dL   Albumin 3.8 3.5 - 5.0 g/dL   AST 16 15 - 41 U/L   ALT 13 (L) 14 - 54 U/L   Alkaline Phosphatase 61 38 - 126 U/L   Total Bilirubin 0.7 0.3 - 1.2 mg/dL   GFR calc non Af Amer >60 >60 mL/min   GFR calc Af Amer >60 >60 mL/min    Comment: (NOTE) The eGFR has been calculated using the CKD EPI equation. This calculation has not been validated in all clinical situations. eGFR's persistently <60 mL/min signify possible Chronic Kidney Disease.    Anion gap 9 5 - 15  Ethanol (ETOH)     Status: None   Collection Time: 09/11/14  6:16 PM  Result Value Ref Range   Alcohol, Ethyl (B) <5 <5 mg/dL    Comment:        LOWEST DETECTABLE LIMIT FOR SERUM ALCOHOL IS 11 mg/dL FOR MEDICAL PURPOSES ONLY   Acetaminophen level     Status: Abnormal   Collection Time: 09/11/14  6:16 PM  Result Value Ref Range   Acetaminophen (Tylenol), Serum <10 (L) 10 - 30 ug/mL    Comment:        THERAPEUTIC CONCENTRATIONS VARY SIGNIFICANTLY. A RANGE OF 10-30 ug/mL MAY BE AN EFFECTIVE CONCENTRATION FOR MANY PATIENTS. HOWEVER, SOME ARE BEST TREATED AT CONCENTRATIONS OUTSIDE THIS RANGE. ACETAMINOPHEN CONCENTRATIONS >150 ug/mL AT 4 HOURS AFTER INGESTION  AND >50 ug/mL AT 12 HOURS AFTER INGESTION ARE OFTEN ASSOCIATED WITH TOXIC REACTIONS.   Salicylate level     Status: None   Collection Time: 09/11/14  6:16 PM  Result Value Ref Range   Salicylate Lvl <1.6 2.8 - 30.0 mg/dL  Lipase, blood     Status: None   Collection Time: 09/11/14  6:49 PM  Result Value Ref Range   Lipase 30 22 - 51 U/L  Urine rapid drug screen (hosp performed)     Status: Abnormal   Collection Time: 09/12/14  3:26 AM  Result Value Ref Range   Opiates NONE DETECTED NONE DETECTED   Cocaine NONE DETECTED NONE DETECTED   Benzodiazepines POSITIVE (A) NONE DETECTED   Amphetamines NONE DETECTED NONE DETECTED   Tetrahydrocannabinol POSITIVE (A) NONE DETECTED   Barbiturates NONE DETECTED NONE DETECTED    Comment:        DRUG SCREEN FOR MEDICAL PURPOSES ONLY.  IF CONFIRMATION IS NEEDED FOR ANY PURPOSE, NOTIFY LAB WITHIN 5 DAYS.        LOWEST DETECTABLE LIMITS FOR URINE DRUG SCREEN Drug Class       Cutoff (ng/mL) Amphetamine      1000 Barbiturate      200 Benzodiazepine   606 Tricyclics       301 Opiates          300 Cocaine          300 THC              50   Urinalysis, Routine w reflex microscopic     Status: Abnormal  Collection Time: 09/12/14  3:26 AM  Result Value Ref Range   Color, Urine YELLOW YELLOW   APPearance CLEAR CLEAR   Specific Gravity, Urine >1.030 (H) 1.005 - 1.030   pH 5.5 5.0 - 8.0   Glucose, UA 100 (A) NEGATIVE mg/dL   Hgb urine dipstick MODERATE (A) NEGATIVE   Bilirubin Urine SMALL (A) NEGATIVE   Ketones, ur NEGATIVE NEGATIVE mg/dL   Protein, ur NEGATIVE NEGATIVE mg/dL   Urobilinogen, UA 0.2 0.0 - 1.0 mg/dL   Nitrite NEGATIVE NEGATIVE   Leukocytes, UA NEGATIVE NEGATIVE  Pregnancy, urine     Status: None   Collection Time: 09/12/14  3:26 AM  Result Value Ref Range   Preg Test, Ur NEGATIVE NEGATIVE  Urine microscopic-add on     Status: Abnormal   Collection Time: 09/12/14  3:26 AM  Result Value Ref Range   Squamous Epithelial /  LPF RARE RARE   WBC, UA 0-2 <3 WBC/hpf   RBC / HPF 3-6 <3 RBC/hpf   Bacteria, UA FEW (A) RARE  CBG monitoring, ED     Status: Abnormal   Collection Time: 09/12/14 12:18 PM  Result Value Ref Range   Glucose-Capillary 161 (H) 65 - 99 mg/dL   Current Medications: Current Facility-Administered Medications  Medication Dose Route Frequency Provider Last Rate Last Dose  . acetaminophen (TYLENOL) tablet 650 mg  650 mg Oral Q6H PRN Dara Hoyer, PA-C   650 mg at 09/13/14 0813  . albuterol (PROVENTIL HFA;VENTOLIN HFA) 108 (90 BASE) MCG/ACT inhaler 2 puff  2 puff Inhalation Q4H PRN Dara Hoyer, PA-C      . albuterol (PROVENTIL) (2.5 MG/3ML) 0.083% nebulizer solution 2.5 mg  2.5 mg Nebulization Q4H PRN Dara Hoyer, PA-C      . ALPRAZolam Duanne Moron) tablet 0.5 mg  0.5 mg Oral QHS Dara Hoyer, PA-C   0.5 mg at 09/12/14 2334  . alum & mag hydroxide-simeth (MAALOX/MYLANTA) 200-200-20 MG/5ML suspension 30 mL  30 mL Oral Q4H PRN Dara Hoyer, PA-C      . atorvastatin (LIPITOR) tablet 10 mg  10 mg Oral QHS Dara Hoyer, PA-C   10 mg at 09/12/14 2332  . bisoprolol (ZEBETA) tablet 10 mg  10 mg Oral Daily Dara Hoyer, PA-C   10 mg at 09/13/14 0911  . canagliflozin (INVOKANA) tablet 100 mg  100 mg Oral Daily Dara Hoyer, PA-C   100 mg at 09/13/14 0754  . DULoxetine (CYMBALTA) DR capsule 60 mg  60 mg Oral Daily Dara Hoyer, PA-C   60 mg at 09/13/14 0754  . famotidine (PEPCID) tablet 20 mg  20 mg Oral QHS Dara Hoyer, PA-C   20 mg at 09/12/14 2332  . linagliptin (TRADJENTA) tablet 5 mg  5 mg Oral Daily Dara Hoyer, PA-C   5 mg at 09/13/14 0754  . magnesium hydroxide (MILK OF MAGNESIA) suspension 30 mL  30 mL Oral Daily PRN Dara Hoyer, PA-C      . pantoprazole (PROTONIX) EC tablet 40 mg  40 mg Oral Daily Dara Hoyer, PA-C   40 mg at 09/13/14 0754  . pregabalin (LYRICA) capsule 100 mg  100 mg Oral TID Dara Hoyer, PA-C   100 mg at 09/13/14 0756  . roflumilast  (DALIRESP) tablet 500 mcg  500 mcg Oral Daily Dara Hoyer, PA-C   500 mcg at 09/13/14 0092   PTA Medications: Prescriptions prior to admission  Medication Sig Dispense Refill Last  Dose  . albuterol (PROVENTIL HFA) 108 (90 BASE) MCG/ACT inhaler Inhale 2 puffs into the lungs every 4 (four) hours as needed for wheezing or shortness of breath. 1 Inhaler 6 Past Month at Unknown time  . albuterol (PROVENTIL) (2.5 MG/3ML) 0.083% nebulizer solution Take 3 mLs (2.5 mg total) by nebulization every 4 (four) hours as needed for wheezing. 120 mL 12 Past Month at Unknown time  . ALPRAZolam (XANAX) 0.5 MG tablet Take 1 tablet (0.5 mg total) by mouth at bedtime. 30 tablet 2 Past Month at Unknown time  . amphetamine-dextroamphetamine (ADDERALL) 20 MG tablet Take 1 tablet (20 mg total) by mouth 2 (two) times daily. 60 tablet 0 Past Month at Unknown time  . atorvastatin (LIPITOR) 10 MG tablet Take 1 tablet (10 mg total) by mouth at bedtime. 30 tablet 6 Past Month at Unknown time  . bisoprolol (ZEBETA) 10 MG tablet Take 1 tablet (10 mg total) by mouth daily. 30 tablet 5 Past Month at Unknown time  . canagliflozin (INVOKANA) 100 MG TABS tablet Take 1 tablet (100 mg total) by mouth daily. 30 tablet 3 Past Month at Unknown time  . Chlorphen-PE-Acetaminophen (NOREL AD) 4-10-325 MG TABS 1 PO bid PRN (Patient not taking: Reported on 09/12/2014) 60 tablet 3 Not Taking at Unknown time  . DULoxetine (CYMBALTA) 60 MG capsule Take 1 capsule (60 mg total) by mouth daily. 30 capsule 2 Past Month at Unknown time  . famotidine (PEPCID) 20 MG tablet take 1 tablet by mouth at bedtime 30 tablet 0 Past Month at Unknown time  . Fe Cbn-Fe Gluc-FA-B12-C-DSS (FERRALET 90) 90-1 MG TABS Take 1 tab daily (Patient taking differently: Take 1 tablet by mouth daily. Take 1 tab daily) 30 each 6 Past Month at Unknown time  . fluconazole (DIFLUCAN) 150 MG tablet Take 1 tablet (150 mg total) by mouth once. (Patient not taking: Reported on 09/12/2014)  1 tablet 1 Completed Course at Unknown time  . ibuprofen (ADVIL,MOTRIN) 200 MG tablet Take 800 mg by mouth every 6 (six) hours as needed for moderate pain (pain). Per bottle as needed for joint pain   Past Month at Unknown time  . pantoprazole (PROTONIX) 40 MG tablet Take 1 tablet (40 mg total) by mouth daily. 30 tablet 0 Past Month at Unknown time  . pregabalin (LYRICA) 100 MG capsule Take 1 capsule (100 mg total) by mouth 3 (three) times daily. 90 capsule 3 Past Month at Unknown time  . Respiratory Therapy Supplies (FLUTTER) DEVI Use as directed   Taking  . roflumilast (DALIRESP) 500 MCG TABS tablet Take 1 tablet (500 mcg total) by mouth daily. 30 tablet 5 Past Month at Unknown time  . sitaGLIPtin (JANUVIA) 100 MG tablet Take 1 tablet (100 mg total) by mouth daily. 30 tablet 0 Past Month at Unknown time  . sulfamethoxazole-trimethoprim (BACTRIM DS,SEPTRA DS) 800-160 MG per tablet Take 1 tablet by mouth 2 (two) times daily. (Patient not taking: Reported on 09/12/2014) 14 tablet 0 Completed Course at Unknown time    Previous Psychotropic Medications: Yes   Substance Abuse History in the last 12 months:  No.    Consequences of Substance Abuse: Medical Consequences:  patient was on pain contract with Dr. Dorian Heckle office  Results for orders placed or performed during the hospital encounter of 09/11/14 (from the past 72 hour(s))  CBC     Status: Abnormal   Collection Time: 09/11/14  6:16 PM  Result Value Ref Range   WBC 13.0 (H) 4.0 -  10.5 K/uL   RBC 5.75 (H) 3.87 - 5.11 MIL/uL   Hemoglobin 12.3 12.0 - 15.0 g/dL   HCT 41.8 36.0 - 46.0 %   MCV 72.7 (L) 78.0 - 100.0 fL   MCH 21.4 (L) 26.0 - 34.0 pg   MCHC 29.4 (L) 30.0 - 36.0 g/dL   RDW 18.4 (H) 11.5 - 15.5 %   Platelets 296 150 - 400 K/uL  Comprehensive metabolic panel     Status: Abnormal   Collection Time: 09/11/14  6:16 PM  Result Value Ref Range   Sodium 139 135 - 145 mmol/L   Potassium 4.0 3.5 - 5.1 mmol/L   Chloride 103 101 - 111  mmol/L   CO2 27 22 - 32 mmol/L   Glucose, Bld 144 (H) 65 - 99 mg/dL   BUN 8 6 - 20 mg/dL   Creatinine, Ser 0.73 0.44 - 1.00 mg/dL   Calcium 8.9 8.9 - 10.3 mg/dL   Total Protein 7.6 6.5 - 8.1 g/dL   Albumin 3.8 3.5 - 5.0 g/dL   AST 16 15 - 41 U/L   ALT 13 (L) 14 - 54 U/L   Alkaline Phosphatase 61 38 - 126 U/L   Total Bilirubin 0.7 0.3 - 1.2 mg/dL   GFR calc non Af Amer >60 >60 mL/min   GFR calc Af Amer >60 >60 mL/min    Comment: (NOTE) The eGFR has been calculated using the CKD EPI equation. This calculation has not been validated in all clinical situations. eGFR's persistently <60 mL/min signify possible Chronic Kidney Disease.    Anion gap 9 5 - 15  Ethanol (ETOH)     Status: None   Collection Time: 09/11/14  6:16 PM  Result Value Ref Range   Alcohol, Ethyl (B) <5 <5 mg/dL    Comment:        LOWEST DETECTABLE LIMIT FOR SERUM ALCOHOL IS 11 mg/dL FOR MEDICAL PURPOSES ONLY   Acetaminophen level     Status: Abnormal   Collection Time: 09/11/14  6:16 PM  Result Value Ref Range   Acetaminophen (Tylenol), Serum <10 (L) 10 - 30 ug/mL    Comment:        THERAPEUTIC CONCENTRATIONS VARY SIGNIFICANTLY. A RANGE OF 10-30 ug/mL MAY BE AN EFFECTIVE CONCENTRATION FOR MANY PATIENTS. HOWEVER, SOME ARE BEST TREATED AT CONCENTRATIONS OUTSIDE THIS RANGE. ACETAMINOPHEN CONCENTRATIONS >150 ug/mL AT 4 HOURS AFTER INGESTION AND >50 ug/mL AT 12 HOURS AFTER INGESTION ARE OFTEN ASSOCIATED WITH TOXIC REACTIONS.   Salicylate level     Status: None   Collection Time: 09/11/14  6:16 PM  Result Value Ref Range   Salicylate Lvl <5.0 2.8 - 30.0 mg/dL  Lipase, blood     Status: None   Collection Time: 09/11/14  6:49 PM  Result Value Ref Range   Lipase 30 22 - 51 U/L  Urine rapid drug screen (hosp performed)     Status: Abnormal   Collection Time: 09/12/14  3:26 AM  Result Value Ref Range   Opiates NONE DETECTED NONE DETECTED   Cocaine NONE DETECTED NONE DETECTED   Benzodiazepines POSITIVE  (A) NONE DETECTED   Amphetamines NONE DETECTED NONE DETECTED   Tetrahydrocannabinol POSITIVE (A) NONE DETECTED   Barbiturates NONE DETECTED NONE DETECTED    Comment:        DRUG SCREEN FOR MEDICAL PURPOSES ONLY.  IF CONFIRMATION IS NEEDED FOR ANY PURPOSE, NOTIFY LAB WITHIN 5 DAYS.        LOWEST DETECTABLE LIMITS FOR URINE DRUG SCREEN  Drug Class       Cutoff (ng/mL) Amphetamine      1000 Barbiturate      200 Benzodiazepine   462 Tricyclics       703 Opiates          300 Cocaine          300 THC              50   Urinalysis, Routine w reflex microscopic     Status: Abnormal   Collection Time: 09/12/14  3:26 AM  Result Value Ref Range   Color, Urine YELLOW YELLOW   APPearance CLEAR CLEAR   Specific Gravity, Urine >1.030 (H) 1.005 - 1.030   pH 5.5 5.0 - 8.0   Glucose, UA 100 (A) NEGATIVE mg/dL   Hgb urine dipstick MODERATE (A) NEGATIVE   Bilirubin Urine SMALL (A) NEGATIVE   Ketones, ur NEGATIVE NEGATIVE mg/dL   Protein, ur NEGATIVE NEGATIVE mg/dL   Urobilinogen, UA 0.2 0.0 - 1.0 mg/dL   Nitrite NEGATIVE NEGATIVE   Leukocytes, UA NEGATIVE NEGATIVE  Pregnancy, urine     Status: None   Collection Time: 09/12/14  3:26 AM  Result Value Ref Range   Preg Test, Ur NEGATIVE NEGATIVE  Urine microscopic-add on     Status: Abnormal   Collection Time: 09/12/14  3:26 AM  Result Value Ref Range   Squamous Epithelial / LPF RARE RARE   WBC, UA 0-2 <3 WBC/hpf   RBC / HPF 3-6 <3 RBC/hpf   Bacteria, UA FEW (A) RARE  CBG monitoring, ED     Status: Abnormal   Collection Time: 09/12/14 12:18 PM  Result Value Ref Range   Glucose-Capillary 161 (H) 65 - 99 mg/dL    Observation Level/Precautions:  Continuous Observation  Laboratory:  reviewed  Psychotherapy:    Medications:    Consultations:  As needed  Discharge Concerns:    Estimated LOS:   5-7 days  Other:     Psychological Evaluations: No   Treatment Plan Summary: Daily contact with patient to assess and evaluate symptoms and  progress in treatment and Medication management  1. Admit for crisis management and stabilization. 2. Medication management to reduce current symptoms to base line and improve the patient's overall level of functioning. 3. Treat health problems as indicated. 4. Develop treatment plan to decrease risk of relapse upon discharge and to reduce the need for readmission. 5. Psycho-social education regarding relapse prevention and self care. 6. Health care follow up as needed for medical problems. 7. Restart home medications where appropriate.  Medical Decision Making:  Review of Psycho-Social Stressors (1), Established Problem, Worsening (2), New Problem, with no additional work-up planned (3) and Independent Review of image, tracing or specimen (2)  I certify that inpatient services furnished can reasonably be expected to improve the patient's condition.   MASHBURN,NEIL 5/22/201610:32 AM   I have personally seen the patient and agreed with the findings and involved in the treatment plan. Berniece Andreas, MD
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
## 2014-09-13 NOTE — Tx Team (Addendum)
Initial Interdisciplinary Treatment Plan   PATIENT STRESSORS: Health problems Marital or family conflict Substance abuse   PATIENT STRENGTHS: Ability for insight Average or above average intelligence Capable of independent living Communication skills General fund of knowledge Motivation for treatment/growth   PROBLEM LIST: Problem List/Patient Goals Date to be addressed Date deferred Reason deferred Estimated date of resolution  Suicide Attempt      Anxiety      Patient states "I don't want to die"      depression 09/17/2014                                    DISCHARGE CRITERIA:  Improved stabilization in mood, thinking, and/or behavior Motivation to continue treatment in a less acute level of care Reduction of life-threatening or endangering symptoms to within safe limits Verbal commitment to aftercare and medication compliance  PRELIMINARY DISCHARGE PLAN: Attend aftercare/continuing care group Outpatient therapy Participate in family therapy Return to previous living arrangement  PATIENT/FAMIILY INVOLVEMENT: This treatment plan has been presented to and reviewed with the patient, Ysidro Evert, and/or family member.Patient and family have been given the opportunity to ask questions and make suggestions.  Loyal Gambler B 09/13/2014, 1:23 AM
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
## 2014-09-13 NOTE — BHH Suicide Risk Assessment (Addendum)
[facility] Admission Suicide Risk Assessment   Nursing information obtained from:    Demographic factors:    Current Mental Status:    Loss Factors:    Historical Factors:    Risk Reduction Factors:    Total Time spent with patient: 45 minutes Principal Problem: MDD (major depressive disorder), recurrent episode, severe Diagnosis:   Patient Active Problem List   Diagnosis Date Noted  . Overdose [T50.901A] 09/12/2014  . Nasal congestion [R09.81] 05/03/2014  . Bloating [R14.0] 05/03/2014  . Acute on chronic respiratory failure with hypoxia [J96.21]   . Chest pain on respiration [R07.1] 03/25/2014  . Upper airway cough syndrome [R05] 02/28/2014  . Diarrhea [R19.7] 10/03/2013  . Abnormal drug screen [R89.2] 04/11/2013  . Sleep apnea [G47.30] 04/11/2013  . Left knee pain [M25.562] 04/03/2013  . Hyperkalemia [E87.5] 01/18/2013  . MRSA pneumonia [J15.212] 01/14/2013  . Perforated ear drum [H72.90] 05/22/2012  . Bell's palsy [G51.0] 01/23/2012  . Carpal tunnel syndrome [G56.00] 01/23/2012  . Carpal tunnel syndrome on both sides [G56.01, G56.02] 01/11/2012  . Pulmonary infiltrates [R91.8] 12/22/2011  . Chronic respiratory failure [J96.10] 10/16/2011  . Oxygen dependent [Z99.81] 10/16/2011  . Peripheral neuropathy [G62.9] 07/07/2011  . Joint pain [M25.50] 07/06/2011  . Vitamin D deficiency [268] 07/06/2011  . Asthmatic bronchitis [J45.909] 06/11/2011  . Back pain [M54.9] 06/11/2011  . Morbid obesity [E66.01] 05/06/2011  . Depression [F32.9] 05/04/2011  . Gastric erosions [K25.9] 12/05/2010  . HTN (hypertension) [I10] 12/02/2010  . DM (diabetes mellitus) type II controlled, neurological manifestation [E11.49] 12/02/2010  . Cigarette smoker [Z72.0] 12/02/2010  . Microcytic anemia [D50.9] 09/08/2010  . GERD (gastroesophageal reflux disease) [K21.9] 09/08/2010  . Esophageal dysphagia [R13.14] 09/08/2010     Continued Clinical Symptoms:  Alcohol Use Disorder Identification Test Final  Score (AUDIT): 0 The "Alcohol Use Disorders Identification Test", Guidelines for Use in Primary Care, Second Edition.  World Pharmacologist [facility]). Score between 0-7:  no or low risk or alcohol related problems. Score between 8-15:  moderate risk of alcohol related problems. Score between 16-19:  high risk of alcohol related problems. Score 20 or above:  warrants further diagnostic evaluation for alcohol dependence and treatment.   CLINICAL FACTORS:   Depression:   Anhedonia Hopelessness Impulsivity Insomnia Recent sense of peace/wellbeing Severe More than one psychiatric diagnosis Unstable or Poor Therapeutic Relationship Previous Psychiatric Diagnoses and Treatments   Musculoskeletal: Strength & Muscle Tone: within normal limits Gait & Station: normal Patient leans: N/A  Psychiatric Specialty Exam: Physical Exam  ROS  Blood pressure 147/75, pulse 94, temperature 97.5 F (36.4 C), temperature source Oral, resp. rate 18, weight 123.378 kg (272 lb), last menstrual period 08/01/2014, SpO2 97 %.Body mass index is 46.67 kg/(m^2).  General Appearance: Casual  Eye Contact::  Fair  Speech:  Slow  Volume:  Decreased  Mood:  Anxious, Depressed and Dysphoric  Affect:  Constricted and Depressed  Thought Process:  Intact  Orientation:  Full (Time, Place, and Person)  Thought Content:  Rumination  Suicidal Thoughts:  Yes.  with intent/plan  Homicidal Thoughts:  No  Memory:  Immediate;   Fair Recent;   Fair Remote;   Fair  Judgement:  Impaired  Insight:  Lacking  Psychomotor Activity:  Decreased  Concentration:  Poor  Recall:  [place]  Language: Fair  Akathisia:  No  Handed:  Right  AIMS (if indicated):     Assets:  Communication Skills Social Support  Sleep:  Number of Hours: 5.5  Cognition:  WNL  ADL's:  Intact     COGNITIVE FEATURES THAT CONTRIBUTE TO RISK:  Polarized thinking and Thought constriction (tunnel vision)    SUICIDE RISK:    Severe:  Frequent, intense, and enduring suicidal ideation, specific plan, no subjective intent, but some objective markers of intent (i.e., choice of lethal method), the method is accessible, some limited preparatory behavior, evidence of impaired self-control, severe dysphoria/symptomatology, multiple risk factors present, and few if any protective factors, particularly a lack of social support.  PLAN OF CARE: Patient is 49 year old Caucasian female who was admitted after taking intentional overdose on her Xanax.  She endorse a lot of family issues and feeling abandoned and disappointment from the family members.  She requires inpatient psychiatric treatment and stabilization.  Please see complete physical and history for more details.  Her UDS is positive for marijuana. Discontinue Xanax and a start low-dose Librium 10 mg for anxiety and a start low-dose Abilify to help the depression.  Medical Decision Making:  New problem, with additional work up planned, Review of Psycho-Social Stressors (1), Review or order clinical lab tests (1), Decision to obtain old records (1), Review and summation of old records (2), Established Problem, Worsening (2), Review of Medication Regimen & Side Effects (2) and Review of New Medication or Change in Dosage (2)  I certify that inpatient services furnished can reasonably be expected to improve the patient's condition.   Roya Gieselman T. 09/13/2014, 1:01 PM

## 2014-09-13 NOTE — Progress Notes (Signed)
Psychoeducational Group Note  Date:  09/13/2014 Time:  1015  Group Topic/Focus:  Making Healthy Choices:   The focus of this group is to help patients identify negative/unhealthy choices they were using prior to admission and identify positive/healthier coping strategies to replace them upon discharge.  Participation Level:  Active  Participation Quality:  Appropriate  Affect:  Appropriate  Cognitive:  Oriented  Insight:  Improving  Engagement in Group:  Engaged  Additional Comments:  Pt participated in the group, and was able to identify needs that they have. They also were able to identify areas that they needed to grow in.  Paulino Rily 09/13/2014

## 2014-09-13 NOTE — BHH Group Notes (Signed)
[place] Group Notes: (Clinical Social Work)   09/13/2014      Type of Therapy:  Group Therapy   Participation Level:  Did Not Attend despite MHT prompting   Selmer Dominion, LCSW 09/13/2014, 4:27 PM
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
## 2014-09-13 NOTE — Plan of Care (Signed)
Problem: Consults Goal: Anxiety Disorder Patient Education See Patient Education Module for eduction specifics.  Outcome: Completed/Met Date Met:  09/13/14 Nurse discussed anxiety/depression/coping skills with patient.

## 2014-09-13 NOTE — Progress Notes (Signed)
Patient ID: [name], female   DOB: 1965/06/22, 49 y.o.   MRN: [id number] D: Client reports depression at "8" of 10, goal today was "to talk to my daughter and I talked to her.Dressing to arm, changed to large bandage, abraised site to both lower arms. A: Writer introduced self to client, reviewed medications, administered as prescribed. Staff will monitor q50mn for safety. R: client is safe on the unit, did not attend group

## 2014-09-14 DIAGNOSIS — F332 Major depressive disorder, recurrent severe without psychotic features: Principal | ICD-10-CM

## 2014-09-14 LAB — GLUCOSE, CAPILLARY
Glucose-Capillary: 138 mg/dL — ABNORMAL HIGH (ref 65–99)
Glucose-Capillary: 125 mg/dL — ABNORMAL HIGH (ref 65–99)
Glucose-Capillary: 147 mg/dL — ABNORMAL HIGH (ref 65–99)

## 2014-09-14 MED ORDER — DULOXETINE HCL 60 MG PO CPEP
60.0000 mg | ORAL_CAPSULE | Freq: Two times a day (BID) | ORAL | Status: DC
Start: 2014-09-14 — End: 2014-09-18
  Administered 2014-09-14 – 2014-09-18 (×8): 60 mg via ORAL
  Filled 2014-09-14: qty 1
  Filled 2014-09-14: qty 6
  Filled 2014-09-14: qty 1
  Filled 2014-09-14: qty 6
  Filled 2014-09-14 (×4): qty 1
  Filled 2014-09-14: qty 6
  Filled 2014-09-14 (×2): qty 1
  Filled 2014-09-14: qty 6
  Filled 2014-09-14 (×2): qty 1

## 2014-09-14 MED ORDER — NICOTINE 21 MG/24HR TD PT24
21.0000 mg | MEDICATED_PATCH | Freq: Every day | TRANSDERMAL | Status: DC
  Administered 2014-09-14 – 2014-09-18 (×5): 21 mg via TRANSDERMAL
  Filled 2014-09-14: qty 14
  Filled 2014-09-14 (×3): qty 1
  Filled 2014-09-14: qty 14
  Filled 2014-09-14 (×2): qty 1

## 2014-09-14 MED ORDER — TRAZODONE HCL 50 MG PO TABS
50.0000 mg | ORAL_TABLET | Freq: Every evening | ORAL | Status: DC | PRN
  Filled 2014-09-14 (×5): qty 1

## 2014-09-14 NOTE — Progress Notes (Signed)
[facility] MD Progress Note  09/14/2014 3:38 PM [name]  MRN:  [id number] Subjective:   Patient states she still feels depressed and feels " like crying a lot" Objective : Case discussed with treatment team and have met with patient. Patient is a 49 year old female, who has been feeling progressively more depressed, and recently impulsively overdosed on Xanax.  The xanax was prescribed, but states she ws not using regularly, and sometimes not at all for several days  - she denies alcohol or BZD abuse- she was using cannabis regularly.She has had prior psychiatric admissions, but not over the last 9-10 years . Denies history of Mania or Hypomania . She states that chronic pain contributes to her depression. She ha chronic medical illness, and states she has been diagnosed with COPD, Diabetes Mellitus, HTN Prior to admission was taking Lyrica for neuropathy- states it has helped more than Neurontin but has been associated with weight gain. She has been on Cymbalta, which she states does not cause side effects.  At this time presents depressed, sad, tearful.  She reports ongoing neuro-vegetative symptoms of depression, such as low energy level, poor concentration, sadness, and low sense of self esteem. States she has been trying to sleep a lot due to low energy and lack of motivation. Some group participation.  Patient has a history of obesity and ruminates about her weight- states " I know my weight is contributing to my medical problems and not helping my mood". States that she cannot exercise much and is sedentary at least partially due to pain( legs, back) . Of note uses CPAP, for sleep apnea- states it has helped her sleep better and feel more energetic in the AM.   Principal Problem: MDD (major depressive disorder), recurrent episode, severe Diagnosis:   Patient Active Problem List   Diagnosis Date Noted  . MDD (major depressive disorder), recurrent episode, severe [F33.2] 09/13/2014  .  Overdose [T50.901A] 09/12/2014  . Nasal congestion [R09.81] 05/03/2014  . Bloating [R14.0] 05/03/2014  . Acute on chronic respiratory failure with hypoxia [J96.21]   . Chest pain on respiration [R07.1] 03/25/2014  . Upper airway cough syndrome [R05] 02/28/2014  . Diarrhea [R19.7] 10/03/2013  . Abnormal drug screen [R89.2] 04/11/2013  . Sleep apnea [G47.30] 04/11/2013  . Left knee pain [M25.562] 04/03/2013  . Hyperkalemia [E87.5] 01/18/2013  . MRSA pneumonia [J15.212] 01/14/2013  . Perforated ear drum [H72.90] 05/22/2012  . Bell's palsy [G51.0] 01/23/2012  . Carpal tunnel syndrome [G56.00] 01/23/2012  . Carpal tunnel syndrome on both sides [G56.01, G56.02] 01/11/2012  . Pulmonary infiltrates [R91.8] 12/22/2011  . Chronic respiratory failure [J96.10] 10/16/2011  . Oxygen dependent [Z99.81] 10/16/2011  . Peripheral neuropathy [G62.9] 07/07/2011  . Joint pain [M25.50] 07/06/2011  . Vitamin D deficiency [268] 07/06/2011  . Asthmatic bronchitis [J45.909] 06/11/2011  . Back pain [M54.9] 06/11/2011  . Morbid obesity [E66.01] 05/06/2011  . Depression [F32.9] 05/04/2011  . Gastric erosions [K25.9] 12/05/2010  . HTN (hypertension) [I10] 12/02/2010  . DM (diabetes mellitus) type II controlled, neurological manifestation [E11.49] 12/02/2010  . Cigarette smoker [Z72.0] 12/02/2010  . Microcytic anemia [D50.9] 09/08/2010  . GERD (gastroesophageal reflux disease) [K21.9] 09/08/2010  . Esophageal dysphagia [R13.14] 09/08/2010   Total Time spent with patient: 25 minutes    Past Medical History:  Past Medical History  Diagnosis Date  . Diabetes mellitus   . Asthmatic bronchitis     normal PFT/ seen by pulmonary no evidence of COPD  . HTN (hypertension)   .  Low back pain   . Tachycardia     never had test done since no insurance  . Depression   . Shortness of breath   . Anxiety   . Gastric erosions     EGD 08/2010.  . Internal hemorrhoids     Colonoscopy 5/12.  [name] Heavy menses   .  Chronic respiratory failure with hypoxia     On 2-3 L of oxygen at home  . GERD (gastroesophageal reflux disease)   . Anemia   . Arthritis   . Sleep apnea   . COPD (chronic obstructive pulmonary disease)     Past Surgical History  Procedure Laterality Date  . Cholecystectomy  1990  . Cesarean section      twice  . Kidney surgery      as child for blockages  . Tubal ligation    . Tonsillectomy    . Wrist surgery  1995    Lt wrist  . Tympanostomy tube placement    . Hysteroscopy with thermachoice  01/17/2012    Procedure: HYSTEROSCOPY WITH THERMACHOICE;  Surgeon: Florian Buff, MD;  Location: [facility];  Service: Gynecology;  Laterality: N/A;  total therapy time: 9:13sec  D5W  18 ml in, D5W   84ml out, temperture 87degrees celcious  . Uterine ablation    . Colonoscopy  09/16/2010    CWC:BJSEGB colon/small internal hemorrhoids  . Esophagogastroduodenoscopy  09/16/2010    SLF: normal/mild gastritis   Family History:  Family History  Problem Relation Age of Onset  . Heart attack Father 30    deceased, etoh use  . Heart disease Father   . Alcohol abuse Father   . Depression Father   . Heart attack Mother 58    deceased  . Diabetes Mother   . Breast cancer Mother   . Heart failure Mother     oxygen dependence, nonsmoker  . Heart disease Mother   . Depression Mother   . Cancer Mother   . Colon cancer Neg Hx   . Liver disease Maternal Aunt 41    died while on liver transplant list  . Heart attack Maternal Grandmother     premature CAD  . Ulcers Sister   . Hypertension Sister    Social History:  History  Alcohol Use  . 0.0 oz/week  . 0 Standard drinks or equivalent per week    Comment: social use     History  Drug Use  . Yes  . Special: Marijuana    History   Social History  . Marital Status: Single    Spouse Name: N/A  . Number of Children: 2  . Years of Education: N/A   Occupational History  . unemployed    Social History Main Topics  . Smoking status:  Current Every Day Smoker -- 0.50 packs/day for 30 years    Types: Cigarettes  . Smokeless tobacco: Never Used     Comment: using the vapor cigarettes  . Alcohol Use: 0.0 oz/week    0 Standard drinks or equivalent per week     Comment: social use  . Drug Use: Yes    Special: Marijuana  . Sexual Activity: No   Other Topics Concern  . None   Social History Narrative   Additional History:    Sleep: Fair  Appetite:  Fair   Assessment:   Musculoskeletal: Strength & Muscle Tone: within normal limits Gait & Station: normal Patient leans: N/A   Psychiatric Specialty Exam: Physical Exam  ROS- denies nausea, vomiting , diarrhea, endorses some dizziness. Endorses low energy and peripheral dysesthesias, related to peripheral neuropathy.  Blood pressure 109/79, pulse 82, temperature 98.2 F (36.8 C), temperature source Oral, resp. rate 18, weight 272 lb (123.378 kg), last menstrual period 08/01/2014, SpO2 97 %.Body mass index is 46.67 kg/(m^2).  General Appearance: Fairly Groomed  Engineer, water::  Fair  Speech:  Normal Rate  Volume:  Decreased  Mood:  Depressed  Affect:  Constricted and Tearful  Thought Process:  Goal Directed and Linear  Orientation:  Other:  fully alert and attentive   Thought Content:  denies hallucinations,no delusions, ruminative about physical health and stressors  Suicidal Thoughts:  Yes.  without intent/plan- at this time denies any thoughts of hurting self on the unit and contracts for safety on the unit.   Homicidal Thoughts:  No  Memory:  recent and remote grossly intact   Judgement:  Other:  fair   Insight:  present   Psychomotor Activity:  Decreased  Concentration:  Good  Recall:  Good  Fund of Knowledge:Good  Language: Good  Akathisia:  Negative  Handed:  Right  AIMS (if indicated):     Assets:  Desire for Improvement Resilience  ADL's:  Fair   Cognition: WNL  Sleep:  Number of Hours: 5.5     Current Medications: Current  Facility-Administered Medications  Medication Dose Route Frequency Provider Last Rate Last Dose  . acetaminophen (TYLENOL) tablet 650 mg  650 mg Oral Q6H PRN Dara Hoyer, PA-C   650 mg at 09/14/14 1531  . albuterol (PROVENTIL HFA;VENTOLIN HFA) 108 (90 BASE) MCG/ACT inhaler 2 puff  2 puff Inhalation Q4H PRN Dara Hoyer, PA-C      . albuterol (PROVENTIL) (2.5 MG/3ML) 0.083% nebulizer solution 2.5 mg  2.5 mg Nebulization Q4H PRN Dara Hoyer, PA-C      . alum & mag hydroxide-simeth (MAALOX/MYLANTA) 200-200-20 MG/5ML suspension 30 mL  30 mL Oral Q4H PRN Dara Hoyer, PA-C      . ARIPiprazole (ABILIFY) tablet 2 mg  2 mg Oral Daily Kathlee Nations, MD   2 mg at 09/14/14 0756  . atorvastatin (LIPITOR) tablet 10 mg  10 mg Oral QHS Dara Hoyer, PA-C   10 mg at 09/13/14 2131  . bisoprolol (ZEBETA) tablet 10 mg  10 mg Oral Daily Dara Hoyer, PA-C   10 mg at 09/14/14 0756  . canagliflozin (INVOKANA) tablet 100 mg  100 mg Oral Daily Dara Hoyer, PA-C   100 mg at 09/14/14 0757  . chlordiazePOXIDE (LIBRIUM) capsule 10 mg  10 mg Oral BID PRN Kathlee Nations, MD   10 mg at 09/14/14 0809  . DULoxetine (CYMBALTA) DR capsule 60 mg  60 mg Oral Daily Dara Hoyer, PA-C   60 mg at 09/14/14 0756  . famotidine (PEPCID) tablet 20 mg  20 mg Oral QHS Dara Hoyer, PA-C   20 mg at 09/13/14 2131  . linagliptin (TRADJENTA) tablet 5 mg  5 mg Oral Daily Dara Hoyer, PA-C   5 mg at 09/14/14 0756  . magnesium hydroxide (MILK OF MAGNESIA) suspension 30 mL  30 mL Oral Daily PRN Dara Hoyer, PA-C      . nicotine (NICODERM CQ - dosed in mg/24 hours) patch 21 mg  21 mg Transdermal Daily Jenne Campus, MD   21 mg at 09/14/14 1026  . pantoprazole (PROTONIX) EC tablet 40 mg  40 mg Oral Daily Jessy Oto  Harold Hedge, PA-C   40 mg at 09/14/14 0756  . pregabalin (LYRICA) capsule 100 mg  100 mg Oral TID Dara Hoyer, PA-C   100 mg at 09/14/14 1208  . roflumilast (DALIRESP) tablet 500 mcg  500 mcg Oral Daily  Dara Hoyer, PA-C   500 mcg at 09/14/14 1751    Lab Results:  Results for orders placed or performed during the hospital encounter of 09/12/14 (from the past 48 hour(s))  Glucose, capillary     Status: Abnormal   Collection Time: 09/13/14 11:41 AM  Result Value Ref Range   Glucose-Capillary 127 (H) 65 - 99 mg/dL   Comment 1 Notify RN    Comment 2 Document in Chart   Glucose, capillary     Status: Abnormal   Collection Time: 09/13/14  4:33 PM  Result Value Ref Range   Glucose-Capillary 142 (H) 65 - 99 mg/dL   Comment 1 Notify RN    Comment 2 Document in Chart   Glucose, capillary     Status: Abnormal   Collection Time: 09/14/14  6:44 AM  Result Value Ref Range   Glucose-Capillary 138 (H) 65 - 99 mg/dL  Glucose, capillary     Status: Abnormal   Collection Time: 09/14/14 12:04 PM  Result Value Ref Range   Glucose-Capillary 125 (H) 65 - 99 mg/dL   Comment 1 Notify RN    Comment 2 Document in Chart     Physical Findings: AIMS: Facial and Oral Movements Muscles of Facial Expression: None, normal Lips and Perioral Area: None, normal Jaw: None, normal Tongue: None, normal,Extremity Movements Upper (arms, wrists, hands, fingers): None, normal Lower (legs, knees, ankles, toes): None, normal, Trunk Movements Neck, shoulders, hips: None, normal, Overall Severity Severity of abnormal movements (highest score from questions above): None, normal Incapacitation due to abnormal movements: None, normal Patient's awareness of abnormal movements (rate only patient's report): No Awareness, Dental Status Current problems with teeth and/or dentures?: No Does patient usually wear dentures?: No  CIWA:  CIWA-Ar Total: 1 COWS:  COWS Total Score: 2   Assessment- patient presented for worsening depression and impulsive BZD  Overdose. She has a history of depression, and has had prior psychiatric admissions but not for many years. States most recently was on combination of Cymbalta, Adderall,  Lyrica.  At this time still  Significantly depressed  depressed, constricted in affect, but no active SI and able to contract for safety. No psychotic symptoms. Responds well to support, encouragement , and affect seems to improve with support, emepathy. She states she does feel Cymbalta is helping " a little "   Treatment Plan Summary: Increase Cymbalta to 60 mgrs BID to address ongoing depression - may also help chronic pain.  Continue Abilify 2 mgrs QDAY as augmentation strategy to address depression. Continue Lyrica 100 mgrs TID to address chronic pain/peripheral neuropathy Protonix 40 mgrs QDAY for history of GERD  D/C Librium, as has no current symptoms of WDL, denies regular BZD use, and may be contributing to sedation, feeling sluggish.  Check TSH in AM to rule out hypothyroidism contributing to depression. Continue use of CPAP at night to address Sleep Apnea.  Medical Decision Making:  Established Problem, Stable/Improving (1), Review of Psycho-Social Stressors (1), Review or order clinical lab tests (1), Review of Medication Regimen & Side Effects (2) and Review of New Medication or Change in Dosage (2)     [name] 09/14/2014, 3:38 PM

## 2014-09-14 NOTE — Plan of Care (Signed)
Problem: Alteration in mood; excessive anxiety as evidenced by: Goal: STG-Pt will report an absence of self-harm thoughts/actions (Patient will report an absence of self-harm thoughts or actions)  Outcome: Progressing Client denies suicidal ideations, client is safe on the unit, as evidenced by q26min safety checks, medication compliance.

## 2014-09-14 NOTE — Progress Notes (Signed)
Patient ID: [name], female   DOB: November 03, 1965, 49 y.o.   MRN: [id number] D: Client has visiting with daughter and her fiance' this evening, reports sister visited today also. Client affect brighter today, reports he enjoyed visits with family, reports depression as "6" of 10. "went down to gymn" Client reports urinating more since taking Latuda "my doctor told me I would probably see this frequency when I started taking the Latuda" Denies burning or discoloration. A: Writer encouraged client to report any other  changes to physician. Writer reviewed medications and administered as prescribed. Staff will monitor q15min for safety. R: Client is safe on the unit, attended group.

## 2014-09-14 NOTE — Plan of Care (Signed)
Problem: Consults Goal: Suicide Risk Patient Education (See Patient Education module for education specifics)  Outcome: Completed/Met Date Met:  09/14/14 Nurse discussed suicidal thoughts/coping skills with patient.

## 2014-09-14 NOTE — Progress Notes (Signed)
Recreation Therapy Notes  Date: 09/14/14 Time: 9:30am Location: [address] Dayroom  Group Topic: Stress Management  Goal Area(s) Addresses:  Patient will verbalize importance of using healthy stress management.  Patient will identify positive emotions associated with healthy stress management.   Intervention: Stress Management  Activity :  Guided Imagery.  LRT introduced and educated patients on stress management technique of guided imagery.  Scripts were used to deliver the technique to patients, patients were asked to follow script read allowed by LRT to engage in practicing the stress management technique.  Education:  Stress Management, Discharge Planning.   Education Outcome: Needs additional education  Clinical Observations/Feedback:  Patient did not attend.   Victorino Sparrow, LRT/CTRS         Victorino Sparrow A 09/14/2014 3:48 PM

## 2014-09-14 NOTE — Progress Notes (Addendum)
D:  Patient's self inventory sheet, patient stated she slept good last night, no sleep med given.  Good appetite, low energy level, poor concentration.  Rated depression and hopeless #8.  Rated anxiety #9.  Denied withdrawals.  Patient did state that she has experienced cramping, agitation, irritability in past 24 hours.  SI off/on, no plan, contracts for safety.  Physical problems, pain, headaches, blurred vision, back, head, legs, shoulders, neck, worst pain in past 24 hours is #8.  Tylenol does not help the pain.  Goal is to try and stay awake and not cry so much.  Plans not to sleep.  No discharge plans.  "I have a home to go back to when I am ready." A:  Medications administered per MD orders.  Emotional support and encouragement given patient. R:  Denied HI.  Denied A/V hallucinations.  SI off/on, contracts for safety, no plan.  Safety maintained with 15 minute checks. Patient stated she checks her blood sugar at home 8 a.m. And 5 p.m.  Does not take insulin at home, takes her prescribed meds.

## 2014-09-14 NOTE — B[facility] Group Notes (Signed)
[facility] LCSW Aftercare Discharge Planning Group Note   09/14/2014 12:45 PM    Participation Quality:  Appropraite  Mood/Affect:  Appropriate  Depression Rating:  8  Anxiety Rating:  8  Thoughts of Suicide:  No  Will you contract for safety?   NA  Current AVH:  No  Plan for Discharge/Comments:  Patient attended discharge planning group and actively participated in group. She advised of seeing Dr. Harrington Challenger at the [facility].  Suicide prevention education reviewed and SPE document provided.   Transportation Means: Patient has transportation.   Supports:  Patient has a support system.   Evette Diclemente, Eulas Post

## 2014-09-14 NOTE — B[facility] Suicide Risk Assessment (Addendum)
[place] INPATIENT:  Family/Significant Other Suicide Prevention Education  Suicide Prevention Education:  Contact Attempts:  Tyron Russell, Daughter, [phone];  has been identified by the patient as the family member/significant other with whom the patient will be residing, and identified as the person(s) who will aid the patient in the event of a mental health crisis.  With written consent from the patient, two attempts were made to provide suicide prevention education, prior to and/or following the patient's discharge.  We were unsuccessful in providing suicide prevention education.  A suicide education pamphlet was given to the patient to share with family/significant other.  Date and time of first attempt: Message left on daughter's voicemail to call CSW. Date and time of second attempt:  Unable to reach daughter at phone number above or leave a message.  [name] 09/14/2014, 3:33 PM

## 2014-09-15 ENCOUNTER — Other Ambulatory Visit: Payer: Self-pay

## 2014-09-15 LAB — GLUCOSE, CAPILLARY: Glucose-Capillary: 124 mg/dL — ABNORMAL HIGH (ref 65–99)

## 2014-09-15 LAB — TSH: TSH: 1.759 u[IU]/mL (ref 0.350–4.500)

## 2014-09-15 MED ORDER — PROPRANOLOL HCL 10 MG PO TABS
ORAL_TABLET | ORAL | Status: AC
  Filled 2014-09-15: qty 1

## 2014-09-15 MED ORDER — TRAZODONE HCL 50 MG PO TABS
50.0000 mg | ORAL_TABLET | Freq: Every evening | ORAL | Status: DC | PRN
  Administered 2014-09-15 – 2014-09-16 (×2): 50 mg via ORAL

## 2014-09-15 MED ORDER — PROPRANOLOL HCL 10 MG PO TABS
10.0000 mg | ORAL_TABLET | Freq: Three times a day (TID) | ORAL | Status: DC
  Administered 2014-09-15 – 2014-09-18 (×6): 10 mg via ORAL
  Filled 2014-09-15 (×14): qty 1

## 2014-09-15 MED ORDER — HYDROXYZINE HCL 25 MG PO TABS
25.0000 mg | ORAL_TABLET | ORAL | Status: DC | PRN
  Administered 2014-09-15 – 2014-09-17 (×4): 25 mg via ORAL
  Filled 2014-09-15 (×3): qty 1

## 2014-09-15 MED ORDER — HYDROXYZINE HCL 25 MG PO TABS
ORAL_TABLET | ORAL | Status: AC
  Filled 2014-09-15: qty 1

## 2014-09-15 NOTE — Progress Notes (Signed)
Patient ID: [name], female   DOB: 01/13/1966, 49 y.o.   MRN: [id number]   Pt found crying in bedroom. Pt states "I just don't want to be here anymore. I can't take the pain." Pt reports increased anxiety and depression. Pt reports "My son's coming to visit me tonight and I'm nervous." Pt begins to cry harder. Pt c/o chest pain. BP slightly elevated 141/101, pulse WNL 87, pulse ox 95%. EKG completed - no abnormalities noted. Per provider pt given Propanolol and Vistaril. Per pharmacy pt's BP will be rechecked in one hour. Pt given emotional support and a 1:1. Will continue to monitor. Pt stable at this time. 6:43 PM

## 2014-09-15 NOTE — Progress Notes (Signed)
Patient ID: [name], female   DOB: [birth year], 49 y.o.   MRN: [id number] [facility] MD Progress Note  09/15/2014 5:22 PM [name]  MRN:  [id number] Subjective:   Patient reports headache, and ongoing depression. She attributes her depression at least partly to her chronic pain. Objective : Case discussed with treatment team and have met with patient. As discussed with Nursing staff more depressed today, somatically focused, but responsive to support, encouragement. Today patient reports increased depression compared to yesterday, when she was feeling " a little better". She states she has been having a headache, and continues to describe chronic back pain and peripheral neuropathy symptoms. She is intermittently tearful, but responds to support, empathy. She describes some passive thoughts of death, but denies any suicidal plan or intention and contracts for safety on unit . She is tolerating medications well thus far and denies side effects. Cymbalta dose has been increased without side effects or nausea thus far. She is visible in day room, and has been going to groups. Family visited yesterday and visit went well. Of note, complained of some  Vague chest discomfort , related to her " pain all over". An EKG was done- no change compared to prior, normal.  TSH 1.75  Principal Problem: MDD (major depressive disorder), recurrent episode, severe Diagnosis:   Patient Active Problem List   Diagnosis Date Noted  . MDD (major depressive disorder), recurrent episode, severe [F33.2] 09/13/2014  . Overdose [T50.901A] 09/12/2014  . Nasal congestion [R09.81] 05/03/2014  . Bloating [R14.0] 05/03/2014  . Acute on chronic respiratory failure with hypoxia [J96.21]   . Chest pain on respiration [R07.1] 03/25/2014  . Upper airway cough syndrome [R05] 02/28/2014  . Diarrhea [R19.7] 10/03/2013  . Abnormal drug screen [R89.2] 04/11/2013  . Sleep apnea [G47.30] 04/11/2013  . Left knee pain  [M25.562] 04/03/2013  . Hyperkalemia [E87.5] 01/18/2013  . MRSA pneumonia [J15.212] 01/14/2013  . Perforated ear drum [H72.90] 05/22/2012  . Bell's palsy [G51.0] 01/23/2012  . Carpal tunnel syndrome [G56.00] 01/23/2012  . Carpal tunnel syndrome on both sides [G56.01, G56.02] 01/11/2012  . Pulmonary infiltrates [R91.8] 12/22/2011  . Chronic respiratory failure [J96.10] 10/16/2011  . Oxygen dependent [Z99.81] 10/16/2011  . Peripheral neuropathy [G62.9] 07/07/2011  . Joint pain [M25.50] 07/06/2011  . Vitamin D deficiency [268] 07/06/2011  . Asthmatic bronchitis [J45.909] 06/11/2011  . Back pain [M54.9] 06/11/2011  . Morbid obesity [E66.01] 05/06/2011  . Depression [F32.9] 05/04/2011  . Gastric erosions [K25.9] 12/05/2010  . HTN (hypertension) [I10] 12/02/2010  . DM (diabetes mellitus) type II controlled, neurological manifestation [E11.49] 12/02/2010  . Cigarette smoker [Z72.0] 12/02/2010  . Microcytic anemia [D50.9] 09/08/2010  . GERD (gastroesophageal reflux disease) [K21.9] 09/08/2010  . Esophageal dysphagia [R13.14] 09/08/2010   Total Time spent with patient: 25 minutes    Past Medical History:  Past Medical History  Diagnosis Date  . Diabetes mellitus   . Asthmatic bronchitis     normal PFT/ seen by pulmonary no evidence of COPD  . HTN (hypertension)   . Low back pain   . Tachycardia     never had test done since no insurance  . Depression   . Shortness of breath   . Anxiety   . Gastric erosions     EGD 08/2010.  . Internal hemorrhoids     Colonoscopy 5/12.  [name] Heavy menses   . Chronic respiratory failure with hypoxia     On 2-3 L of oxygen at home  . GERD (gastroesophageal  reflux disease)   . Anemia   . Arthritis   . Sleep apnea   . COPD (chronic obstructive pulmonary disease)     Past Surgical History  Procedure Laterality Date  . Cholecystectomy  1990  . Cesarean section      twice  . Kidney surgery      as child for blockages  . Tubal ligation    .  Tonsillectomy    . Wrist surgery  1995    Lt wrist  . Tympanostomy tube placement    . Hysteroscopy with thermachoice  01/17/2012    Procedure: HYSTEROSCOPY WITH THERMACHOICE;  Surgeon: Florian Buff, MD;  Location: [facility];  Service: Gynecology;  Laterality: N/A;  total therapy time: 9:13sec  D5W  18 ml in, D5W   6m out, temperture 87degrees celcious  . Uterine ablation    . Colonoscopy  09/16/2010    SPRX:YVOPFYcolon/small internal hemorrhoids  . Esophagogastroduodenoscopy  09/16/2010    SLF: normal/mild gastritis   Family History:  Family History  Problem Relation Age of Onset  . Heart attack Father 468   deceased, etoh use  . Heart disease Father   . Alcohol abuse Father   . Depression Father   . Heart attack Mother 518   deceased  . Diabetes Mother   . Breast cancer Mother   . Heart failure Mother     oxygen dependence, nonsmoker  . Heart disease Mother   . Depression Mother   . Cancer Mother   . Colon cancer Neg Hx   . Liver disease Maternal AAunt 51   died while on liver transplant list  . Heart attack Maternal Grandmother     premature CAD  . Ulcers Sister   . Hypertension Sister    Social History:  History  Alcohol Use  . 0.0 oz/week  . 0 Standard drinks or equivalent per week    Comment: social use     History  Drug Use  . Yes  . Special: Marijuana    History   Social History  . Marital Status: Single    Spouse Name: N/A  . Number of Children: 2  . Years of Education: N/A   Occupational History  . unemployed    Social History Main Topics  . Smoking status: Current Every Day Smoker -- 0.50 packs/day for 30 years    Types: Cigarettes  . Smokeless tobacco: Never Used     Comment: using the vapor cigarettes  . Alcohol Use: 0.0 oz/week    0 Standard drinks or equivalent per week     Comment: social use  . Drug Use: Yes    Special: Marijuana  . Sexual Activity: No   Other Topics Concern  . None   Social History Narrative   Additional  History:    Sleep: improved   Appetite:  Fair   Assessment:   Musculoskeletal: Strength & Muscle Tone: within normal limits Gait & Station: normal Patient leans: N/A   Psychiatric Specialty Exam: Physical Exam  ROS- denies nausea, vomiting , diarrhea, endorses some dizziness. Endorses headache,  Peripheral neuropathy  Blood pressure 141/101, pulse 84, temperature 98.5 F (36.9 C), temperature source Oral, resp. rate 16, weight 272 lb (123.378 kg), last menstrual period 08/01/2014, SpO2 97 %.Body mass index is 46.67 kg/(m^2).  General Appearance: Fairly Groomed  EEngineer, water:  Fair  Speech:  Normal Rate  Volume:  Decreased  Mood:  Remains depressed   Affect:  Intermittently tearful  Thought Process:  Goal Directed and Linear  Orientation:  Other:  fully alert and attentive   Thought Content:  denies hallucinations,no delusions, ruminative about physical health and stressors  Suicidal Thoughts:  Yes.  without intent/plan- at this time denies any thoughts of hurting self on the unit and contracts for safety on the unit.   Homicidal Thoughts:  No  Memory:  recent and remote grossly intact   Judgement:  Other:  fair   Insight:  present   Psychomotor Activity:  Decreased  Concentration:  Good  Recall:  Good  Fund of Knowledge:Good  Language: Good  Akathisia:  Negative  Handed:  Right  AIMS (if indicated):     Assets:  Desire for Improvement Resilience  ADL's:  Fair   Cognition: WNL  Sleep:  Number of Hours: 6     Current Medications: Current Facility-Administered Medications  Medication Dose Route Frequency Provider Last Rate Last Dose  . acetaminophen (TYLENOL) tablet 650 mg  650 mg Oral Q6H PRN Dara Hoyer, PA-C   650 mg at 09/15/14 1540  . albuterol (PROVENTIL HFA;VENTOLIN HFA) 108 (90 BASE) MCG/ACT inhaler 2 puff  2 puff Inhalation Q4H PRN Dara Hoyer, PA-C      . albuterol (PROVENTIL) (2.5 MG/3ML) 0.083% nebulizer solution 2.5 mg  2.5 mg Nebulization Q4H  PRN Dara Hoyer, PA-C      . alum & mag hydroxide-simeth (MAALOX/MYLANTA) 200-200-20 MG/5ML suspension 30 mL  30 mL Oral Q4H PRN Dara Hoyer, PA-C      . ARIPiprazole (ABILIFY) tablet 2 mg  2 mg Oral Daily Kathlee Nations, MD   2 mg at 09/15/14 0825  . atorvastatin (LIPITOR) tablet 10 mg  10 mg Oral QHS Dara Hoyer, PA-C   10 mg at 09/14/14 2143  . bisoprolol (ZEBETA) tablet 10 mg  10 mg Oral Daily Dara Hoyer, PA-C   10 mg at 09/15/14 2197  . canagliflozin [facility]) tablet 100 mg  100 mg Oral Daily Dara Hoyer, PA-C   100 mg at 09/15/14 5883  . DULoxetine (CYMBALTA) DR capsule 60 mg  60 mg Oral BID Jenne Campus, MD   60 mg at 09/15/14 1704  . famotidine (PEPCID) tablet 20 mg  20 mg Oral QHS Dara Hoyer, PA-C   20 mg at 09/14/14 2143  . hydrOXYzine (ATARAX/VISTARIL) 25 MG tablet           . hydrOXYzine (ATARAX/VISTARIL) tablet 25 mg  25 mg Oral Q4H PRN Encarnacion Slates, NP   25 mg at 09/15/14 1620  . linagliptin (TRADJENTA) tablet 5 mg  5 mg Oral Daily Dara Hoyer, PA-C   5 mg at 09/15/14 0825  . magnesium hydroxide (MILK OF MAGNESIA) suspension 30 mL  30 mL Oral Daily PRN Dara Hoyer, PA-C      . nicotine (NICODERM CQ - dosed in mg/24 hours) patch 21 mg  21 mg Transdermal Daily Jenne Campus, MD   21 mg at 09/15/14 0824  . pantoprazole (PROTONIX) EC tablet 40 mg  40 mg Oral Daily Dara Hoyer, PA-C   40 mg at 09/15/14 2549  . pregabalin (LYRICA) capsule 100 mg  100 mg Oral TID Dara Hoyer, PA-C   100 mg at 09/15/14 1704  . propranolol (INDERAL) 10 MG tablet           . propranolol (INDERAL) tablet 10 mg  10 mg Oral TID Encarnacion Slates, NP   10  mg at 09/15/14 1620  . roflumilast (DALIRESP) tablet 500 mcg  500 mcg Oral Daily Dara Hoyer, PA-C   500 mcg at 09/15/14 0825  . traZODone (DESYREL) tablet 50 mg  50 mg Oral QHS,MR X 1 Laverle Hobby, PA-C   50 mg at 09/14/14 2300    Lab Results:  Results for orders placed or performed during the hospital  encounter of 09/12/14 (from the past 48 hour(s))  Glucose, capillary     Status: Abnormal   Collection Time: 09/14/14  6:44 AM  Result Value Ref Range   Glucose-Capillary 138 (H) 65 - 99 mg/dL  Glucose, capillary     Status: Abnormal   Collection Time: 09/14/14 12:04 PM  Result Value Ref Range   Glucose-Capillary 125 (H) 65 - 99 mg/dL   Comment 1 Notify RN    Comment 2 Document in Chart   Glucose, capillary     Status: Abnormal   Collection Time: 09/14/14  4:43 PM  Result Value Ref Range   Glucose-Capillary 147 (H) 65 - 99 mg/dL   Comment 1 Notify RN    Comment 2 Document in Chart   Glucose, capillary     Status: Abnormal   Collection Time: 09/15/14  6:09 AM  Result Value Ref Range   Glucose-Capillary 124 (H) 65 - 99 mg/dL  TSH     Status: None   Collection Time: 09/15/14  6:22 AM  Result Value Ref Range   TSH 1.759 0.350 - 4.500 uIU/mL    Comment: Performed at [facility]    Physical Findings: AIMS: Facial and Oral Movements Muscles of Facial Expression: None, normal Lips and Perioral Area: None, normal Jaw: None, normal Tongue: None, normal,Extremity Movements Upper (arms, wrists, hands, fingers): None, normal Lower (legs, knees, ankles, toes): None, normal, Trunk Movements Neck, shoulders, hips: None, normal, Overall Severity Severity of abnormal movements (highest score from questions above): None, normal Incapacitation due to abnormal movements: None, normal Patient's awareness of abnormal movements (rate only patient's report): No Awareness, Dental Status Current problems with teeth and/or dentures?: No Does patient usually wear dentures?: No  CIWA:  CIWA-Ar Total: 1 COWS:  COWS Total Score: 2   Assessment-  Continues to report depression, which she attributes partially  to chronic pain. Continues to present sad , constricted in affect today , but report from staff is that yesterday she presented with fuller range of affect.  Denies medication  side effects, and has tolerated Cymbalta titration well . She states Lyrica is partially helpful to address chronic pain. She does not think Lyrica is worsening depression as she states she has been on this medication for " years " with good results and tolerance . States Neurontin was not as effective as Lyrica.  Of note, TSH normal.  Treatment Plan Summary: Continue Cymbalta to 60 mgrs BID to address ongoing depression/ chronic pain Continue Abilify 2 mgrs QDAY as augmentation strategy to address depression. Continue Lyrica 100 mgrs TID to address chronic pain/peripheral neuropathy Continue Protonix 40 mgrs QDAY for history of GERD . Continue use of CPAP at night to address Sleep Apnea.  Medical Decision Making:  Established Problem, Stable/Improving (1), Review of Psycho-Social Stressors (1), Review or order clinical lab tests (1) and Review of Medication Regimen & Side Effects (2)     COBOS, FERNANDO 09/15/2014, 5:22 PM

## 2014-09-15 NOTE — Progress Notes (Signed)
Adult Psychoeducational Group Note  Date:  09/15/2014 Time:  0900  Group Topic/Focus:  Orientation:   The focus of this group is to educate the patient on the purpose and policies of crisis stabilization and provide a format to answer questions about their admission.  The group details unit policies and expectations of patients while admitted.  Participation Level:  Active  Participation Quality:  Appropriate  Affect:  Appropriate  Cognitive:  Appropriate  Insight: Appropriate  Engagement in Group:  Engaged  Modes of Intervention:  Education  Additional Comments:    [name] L 09/15/2014, 10:14 AM

## 2014-09-15 NOTE — Tx Team (Signed)
Interdisciplinary Treatment Plan Update (Adult) Date: 09/15/2014   Time Reviewed: 9:30 AM  Progress in Treatment: Attending groups: CSW continuing to assess Participating in groups: CSW continuing to assess Taking medication as prescribed: Yes Tolerating medication: Yes Family/Significant other contact made: No, CSW assessing for appropriate contacts Patient understands diagnosis: Yes Discussing patient identified problems/goals with staff: Yes Medical problems stabilized or resolved: Yes Denies suicidal/homicidal ideation: Yes Issues/concerns per patient self-inventory: Yes Other:  New problem(s) identified: N/A  Discharge Plan or Barriers:  5/24: Patient plans to return home to follow up with outpatient services.  Reason for Continuation of Hospitalization:  Depression Anxiety Medication Stabilization   Comments: N/A  Estimated length of stay: 2-3 days  For review of initial/current patient goals, please see plan of care.  [name] is a 49yo female admitted for suicide attempt, has attempted many times previously. Is on disability for multiple medical issues, a lot of pain and cannot get pain management due to smoking marijuana for stress relief. Is living with sister, nephew, son and there is a lot of tension in the home. Son and daughter have been fighting, which caused her most recent increase in depression. Sees Dr. Harrington Challenger at [facility] for med mgmt, who has referred her for therapy which she did not go to, admits she needs to. Also wants records sent to [place]. The patient would benefit from safety monitoring, medication evaluation, psychoeducation, group therapy, and discharge planning to link with ongoing resources. The patient is a smoker, declined referral to [facility] for smoking cessation. The Discharge Process and Patient Involvement form was reviewed with patient at the end of the Psychosocial Assessment, and the patient confirmed understanding  and signed that document, which was placed in the paper chart. Suicide Prevention Education was reviewed thoroughly, and a brochure left with patient. The patient signed consent for SPE to be provided to daugher [facility] [phone].   Attendees: Patient:    Family:    Physician: Dr. Parke Poisson; Dr. Sabra Heck 09/15/2014 9:30 AM  Nursing: Larinda Buttery, Janann August, RN 09/15/2014 9:30 AM  Clinical Social Worker: Tilden Fossa,  [place] 09/15/2014 9:30 AM  Other: Peri Maris, LCSWA  09/15/2014 9:30 AM  Other: Lucinda Dell, [place] Sessions Liaison 09/15/2014 9:30 AM  Other: Lars Pinks, Case Manager 09/15/2014 9:30 AM  Other: Agustina Caroli, May Augustin, NP 09/15/2014 9:30 AM  Other:    Other:    Other:    Other:    Other:       Scribe for Treatment Team:  Tilden Fossa, MSW, [place] [phone]

## 2014-09-15 NOTE — Plan of Care (Signed)
Problem: Diagnosis: Increased Risk For Suicide Attempt Goal: STG-Patient Will Attend All Groups On The Unit Outcome: Progressing Pt has attended all groups today

## 2014-09-15 NOTE — Progress Notes (Signed)
Patient ID: Ysidro Evert, female   DOB: May 25, 1965, 49 y.o.   MRN: [id number]  Pt currently presents with a flat affect and guarded behavior. Pt does not forward much during am interaction and remains in bed for most of the day. Pt has a tearful episode and increased anxiety this afternoon. Per self inventory, pt rates depression at a 6, hopelessness 6 and anxiety 5. Pt's daily goal is to "try to be more sociable and try not to sleep as much" and they intend to do so by "talk to more people." Pt reports fair sleep, good concentration and a good appetite.   Pt provided with scheduled and prn medications per providers orders. Pt's labs and vitals were monitored throughout the day. Pt supported emotionally and encouraged to express concerns and questions. Pt educated on medications. Pt's CPAP machine secured in nurses station throughout the day.   Pt's safety ensured with 15 minute and environmental checks. Pt currently denies SI/HI and A/V hallucinations. Pt verbally agrees to seek staff if SI/HI or A/VH occurs and to consult with staff before acting on these thoughts. Pt stable at this time and states "I changed into this yellow shirt for my son, I thought it would look happy."

## 2014-09-15 NOTE — [facility] Group Notes (Signed)
[place] LCSW Group Therapy 09/15/2014  1:15 PM   Type of Therapy: Group Therapy  Participation Level: Did Not Attend. Patient invited to participate but declined.   Tilden Fossa, MSW, [place] Worker [facility] [phone]

## 2014-09-15 NOTE — Progress Notes (Signed)
Recreation Therapy Notes  Animal-Assisted Activity (AAA) Program Checklist/Progress Notes Patient Eligibility Criteria Checklist & Daily Group note for Rec Tx Intervention  Date: 09/15/14 Time: 2:30pm Location: [address]   AAA/T Program Assumption of Risk Form signed by Patient/ or Parent Legal Guardian yes  Patient is free of allergies or sever asthma yes  Patient reports no fear of animals yes  Patient reports no history of cruelty to animalsyes  Patient understands his/her participation is voluntary yes  Patient washes hands before animal contact yes  Patient washes hands after animal contact yes  Education: Hand Washing, Appropriate Animal Interaction   Education Outcome: Acknowledges understanding/In group clarification offered/Needs additional education.   Clinical Observations/Feedback: Patient did not attend group.   Victorino Sparrow, LRT/CTRS         Victorino Sparrow A 09/15/2014 4:33 PM

## 2014-09-15 NOTE — Progress Notes (Signed)
Patient ID: [name], female   DOB: 1965-08-17, 49 y.o.   MRN: [id number] D: Client reports decreased pain at "5" of 10, says day been "a little bit better, my son and niece came to see me" then client says "had a bad day"  Goal today: "try not to sleep so much" "want to feel like myself again, so I don't feel tired, sad all the time" Client very somatic reports "I have osteoarthritis, back pain that radiates down my legs" "I had COPD, I was on continuous oxygen one time, then I started using the CPAP and don't have to use it anymore" "I'm unable to work., I don't have a car so I don't go any where"  A: Writer provided emotional support, but every suggestion made, client gave a reason what she couldn't follow through. Staff will monitor q63min for safety. R: Client is safe on the unit, attended group

## 2014-09-15 NOTE — Progress Notes (Signed)
The focus of this group is to help patients review their daily goal of treatment and discuss progress on daily workbooks. Pt attended the evening group session and responded to all discussion prompts from the [place]. Pt shared that today was a good day, the highlight of which was a visit from her family. Pt told the Writer that she felt considerably better than when she came in to which her peers commented, "She's actually been smiling today!" Pt laughed and agreed with them. Pt shared that her goal this week was to get her medications worked out. Her affect was appropriate.

## 2014-09-16 LAB — GLUCOSE, CAPILLARY: Glucose-Capillary: 143 mg/dL — ABNORMAL HIGH (ref 65–99)

## 2014-09-16 NOTE — Progress Notes (Signed)
Patient ID: [name], female   DOB: 1965-10-24, 49 y.o.   MRN: [id number] [facility] MD Progress Note  09/16/2014 3:24 PM [name]  MRN:  [id number] Subjective: Today patient reports she is feeling " a little better". She states her headache is resolved at this time. She continues to report chronic pain, which she associates to her chronic depression. Patient spoke at more length about her chronic pain today- stated that she had been on opiate management for pain, but disliked it due to sedation and admits she had started taking more than prescribed so wanted to stop before " becoming addicted ".   States Neurontin did not seem to help much, but Lyrica has been more effective.  Objective : Case discussed with treatment team and have met with patient. Today patient partially improved compared to yesterday- headache, reported as severe yesterday, has now subsided and patient is presenting with less constricted affect and less depression today. Today not tearful, and although still constricted in affect, it was more reactive, did smile briefly at times . More visible in milieu today.  Better able to focus on potential disposition options today- states she plans to return home and to continue seeing Dr. Harrington Challenger, with whom she has a good therapeutic alliance . No medication side effects today- Cymbalta has been increased to 120 mgrs total daily dose. We reviewed side effects. We also discussed augmentation strategy ( low dose Abilify)- patient seems more optimistic that medication changes will result in clinical improvement. Responsive to support, encouragement. No disruptive behaviors on unit . TSH 1.75  Principal Problem: MDD (major depressive disorder), recurrent episode, severe Diagnosis:   Patient Active Problem List   Diagnosis Date Noted  . MDD (major depressive disorder), recurrent episode, severe [F33.2] 09/13/2014  . Overdose [T50.901A] 09/12/2014  . Nasal congestion [R09.81]  05/03/2014  . Bloating [R14.0] 05/03/2014  . Acute on chronic respiratory failure with hypoxia [J96.21]   . Chest pain on respiration [R07.1] 03/25/2014  . Upper airway cough syndrome [R05] 02/28/2014  . Diarrhea [R19.7] 10/03/2013  . Abnormal drug screen [R89.2] 04/11/2013  . Sleep apnea [G47.30] 04/11/2013  . Left knee pain [M25.562] 04/03/2013  . Hyperkalemia [E87.5] 01/18/2013  . MRSA pneumonia [J15.212] 01/14/2013  . Perforated ear drum [H72.90] 05/22/2012  . Bell's palsy [G51.0] 01/23/2012  . Carpal tunnel syndrome [G56.00] 01/23/2012  . Carpal tunnel syndrome on both sides [G56.01, G56.02] 01/11/2012  . Pulmonary infiltrates [R91.8] 12/22/2011  . Chronic respiratory failure [J96.10] 10/16/2011  . Oxygen dependent [Z99.81] 10/16/2011  . Peripheral neuropathy [G62.9] 07/07/2011  . Joint pain [M25.50] 07/06/2011  . Vitamin D deficiency [268] 07/06/2011  . Asthmatic bronchitis [J45.909] 06/11/2011  . Back pain [M54.9] 06/11/2011  . Morbid obesity [E66.01] 05/06/2011  . Depression [F32.9] 05/04/2011  . Gastric erosions [K25.9] 12/05/2010  . HTN (hypertension) [I10] 12/02/2010  . DM (diabetes mellitus) type II controlled, neurological manifestation [E11.49] 12/02/2010  . Cigarette smoker [Z72.0] 12/02/2010  . Microcytic anemia [D50.9] 09/08/2010  . GERD (gastroesophageal reflux disease) [K21.9] 09/08/2010  . Esophageal dysphagia [R13.14] 09/08/2010   Total Time spent with patient: 20 minutes   Past Medical History:  Past Medical History  Diagnosis Date  . Diabetes mellitus   . Asthmatic bronchitis     normal PFT/ seen by pulmonary no evidence of COPD  . HTN (hypertension)   . Low back pain   . Tachycardia     never had test done since no insurance  . Depression   .  Shortness of breath   . Anxiety   . Gastric erosions     EGD 08/2010.  . Internal hemorrhoids     Colonoscopy 5/12.  [name] Heavy menses   . Chronic respiratory failure with hypoxia     On 2-3 L of oxygen  at home  . GERD (gastroesophageal reflux disease)   . Anemia   . Arthritis   . Sleep apnea   . COPD (chronic obstructive pulmonary disease)     Past Surgical History  Procedure Laterality Date  . Cholecystectomy  1990  . Cesarean section      twice  . Kidney surgery      as child for blockages  . Tubal ligation    . Tonsillectomy    . Wrist surgery  1995    Lt wrist  . Tympanostomy tube placement    . Hysteroscopy with thermachoice  01/17/2012    Procedure: HYSTEROSCOPY WITH THERMACHOICE;  Surgeon: Florian Buff, MD;  Location: [facility];  Service: Gynecology;  Laterality: N/A;  total therapy time: 9:13sec  D5W  18 ml in, D5W   51ml out, temperture 87degrees celcious  . Uterine ablation    . Colonoscopy  09/16/2010    FXT:[id number] colon/small internal hemorrhoids  . Esophagogastroduodenoscopy  09/16/2010    SLF: normal/mild gastritis   Family History:  Family History  Problem Relation Age of Onset  . Heart attack Father 71    deceased, etoh use  . Heart disease Father   . Alcohol abuse Father   . Depression Father   . Heart attack Mother 67    deceased  . Diabetes Mother   . Breast cancer Mother   . Heart failure Mother     oxygen dependence, nonsmoker  . Heart disease Mother   . Depression Mother   . Cancer Mother   . Colon cancer Neg Hx   . Liver disease Maternal Aunt 69    died while on liver transplant list  . Heart attack Maternal Grandmother     premature CAD  . Ulcers Sister   . Hypertension Sister    Social History:  History  Alcohol Use  . 0.0 oz/week  . 0 Standard drinks or equivalent per week    Comment: social use     History  Drug Use  . Yes  . Special: Marijuana    History   Social History  . Marital Status: Single    Spouse Name: N/A  . Number of Children: 2  . Years of Education: N/A   Occupational History  . unemployed    Social History Main Topics  . Smoking status: Current Every Day Smoker -- 0.50 packs/day for 30 years    Types:  Cigarettes  . Smokeless tobacco: Never Used     Comment: using the vapor cigarettes  . Alcohol Use: 0.0 oz/week    0 Standard drinks or equivalent per week     Comment: social use  . Drug Use: Yes    Special: Marijuana  . Sexual Activity: No   Other Topics Concern  . None   Social History Narrative   Additional History:    Sleep: improved   Appetite:  Fair   Assessment:   Musculoskeletal: Strength & Muscle Tone: within normal limits Gait & Station: normal Patient leans: N/A   Psychiatric Specialty Exam: Physical Exam  ROS- denies nausea, vomiting , diarrhea, endorses some dizziness.No headache today , (+)  Peripheral neuropathy  Blood pressure 113/41, pulse 67, temperature  98.1 F (36.7 C), temperature source Oral, resp. rate 18, weight 272 lb (123.378 kg), last menstrual period 08/01/2014, SpO2 97 %.Body mass index is 46.67 kg/(m^2).  General Appearance: improved grooming  Eye Contact::  Good  Speech:  Normal Rate  Volume:  Decreased  Mood:  Still depressed but affect fuller in range today  Affect:   Toy Cookey in range   Thought Process:  Goal Directed and Linear  Orientation:  Other:  fully alert and attentive   Thought Content:  denies hallucinations,no delusions, ruminative about physical health and stressors  Suicidal Thoughts:  No- at this time denies any thoughts of hurting self on the unit and contracts for safety on the unit.   Homicidal Thoughts:  No  Memory:  recent and remote grossly intact   Judgement:  Other:  fair   Insight:  present   Psychomotor Activity:  Decreased  Concentration:  Good  Recall:  Good  Fund of Knowledge:Good  Language: Good  Akathisia:  Negative  Handed:  Right  AIMS (if indicated):     Assets:  Desire for Improvement Resilience  ADL's:  Fair   Cognition: WNL  Sleep:  Number of Hours: 5     Current Medications: Current Facility-Administered Medications  Medication Dose Route Frequency Provider Last Rate Last Dose  .  acetaminophen (TYLENOL) tablet 650 mg  650 mg Oral Q6H PRN Dara Hoyer, PA-C   650 mg at 09/16/14 5784  . albuterol (PROVENTIL HFA;VENTOLIN HFA) 108 (90 BASE) MCG/ACT inhaler 2 puff  2 puff Inhalation Q4H PRN Dara Hoyer, PA-C      . albuterol (PROVENTIL) (2.5 MG/3ML) 0.083% nebulizer solution 2.5 mg  2.5 mg Nebulization Q4H PRN Dara Hoyer, PA-C      . alum & mag hydroxide-simeth (MAALOX/MYLANTA) 200-200-20 MG/5ML suspension 30 mL  30 mL Oral Q4H PRN Dara Hoyer, PA-C      . ARIPiprazole (ABILIFY) tablet 2 mg  2 mg Oral Daily Kathlee Nations, MD   2 mg at 09/16/14 0818  . atorvastatin (LIPITOR) tablet 10 mg  10 mg Oral QHS Dara Hoyer, PA-C   10 mg at 09/15/14 2158  . bisoprolol (ZEBETA) tablet 10 mg  10 mg Oral Daily Dara Hoyer, PA-C   10 mg at 09/16/14 6962  . canagliflozin (INVOKANA) tablet 100 mg  100 mg Oral Daily Dara Hoyer, PA-C   100 mg at 09/16/14 0818  . DULoxetine (CYMBALTA) DR capsule 60 mg  60 mg Oral BID Jenne Campus, MD   60 mg at 09/16/14 0818  . famotidine (PEPCID) tablet 20 mg  20 mg Oral QHS Dara Hoyer, PA-C   20 mg at 09/15/14 2157  . hydrOXYzine (ATARAX/VISTARIL) tablet 25 mg  25 mg Oral Q4H PRN Encarnacion Slates, NP   25 mg at 09/15/14 1620  . linagliptin (TRADJENTA) tablet 5 mg  5 mg Oral Daily Dara Hoyer, PA-C   5 mg at 09/16/14 0818  . magnesium hydroxide (MILK OF MAGNESIA) suspension 30 mL  30 mL Oral Daily PRN Dara Hoyer, PA-C      . nicotine (NICODERM CQ - dosed in mg/24 hours) patch 21 mg  21 mg Transdermal Daily Jenne Campus, MD   21 mg at 09/16/14 0817  . pantoprazole (PROTONIX) EC tablet 40 mg  40 mg Oral Daily Dara Hoyer, PA-C   40 mg at 09/16/14 0818  . pregabalin (LYRICA) capsule 100 mg  100 mg Oral  TID Dara Hoyer, PA-C   100 mg at 09/16/14 1205  . propranolol (INDERAL) tablet 10 mg  10 mg Oral TID Encarnacion Slates, NP   Stopped at 09/16/14 1200  . roflumilast (DALIRESP) tablet 500 mcg  500 mcg Oral Daily  Dara Hoyer, PA-C   500 mcg at 09/16/14 0818  . traZODone (DESYREL) tablet 50 mg  50 mg Oral QHS PRN Jenne Campus, MD   50 mg at 09/15/14 2157    Lab Results:  Results for orders placed or performed during the hospital encounter of 09/12/14 (from the past 48 hour(s))  Glucose, capillary     Status: Abnormal   Collection Time: 09/14/14  4:43 PM  Result Value Ref Range   Glucose-Capillary 147 (H) 65 - 99 mg/dL   Comment 1 Notify RN    Comment 2 Document in Chart   Glucose, capillary     Status: Abnormal   Collection Time: 09/15/14  6:09 AM  Result Value Ref Range   Glucose-Capillary 124 (H) 65 - 99 mg/dL  TSH     Status: None   Collection Time: 09/15/14  6:22 AM  Result Value Ref Range   TSH 1.759 0.350 - 4.500 uIU/mL    Comment: Performed at [facility]  Glucose, capillary     Status: Abnormal   Collection Time: 09/16/14  6:26 AM  Result Value Ref Range   Glucose-Capillary 143 (H) 65 - 99 mg/dL    Physical Findings: AIMS: Facial and Oral Movements Muscles of Facial Expression: None, normal Lips and Perioral Area: None, normal Jaw: None, normal Tongue: None, normal,Extremity Movements Upper (arms, wrists, hands, fingers): None, normal Lower (legs, knees, ankles, toes): None, normal, Trunk Movements Neck, shoulders, hips: None, normal, Overall Severity Severity of abnormal movements (highest score from questions above): None, normal Incapacitation due to abnormal movements: None, normal Patient's awareness of abnormal movements (rate only patient's report): No Awareness, Dental Status Current problems with teeth and/or dentures?: No Does patient usually wear dentures?: No  CIWA:  CIWA-Ar Total: 1 COWS:  COWS Total Score: 2   Assessment-   Still depressed, but some improvement compared to yesterday's presentation- at this time feeling better, no longer experiencing a headache, and range of affect /grooming/eye contact are all improved. She also seems  less negative/pessimistic. Tolerating medications well at this time.  We reviewed the benefits of smoking cessation.  Treatment Plan Summary: Continue Cymbalta to 60 mgrs BID to address ongoing depression/ chronic pain Continue Abilify 2 mgrs QDAY as augmentation strategy to address depression. Continue Lyrica 100 mgrs TID to address chronic pain/peripheral neuropathy Continue Tradjenta, Invokana for DM Continue  Proventil inhaler PRN and Daliresp for COPD history.  Continue Protonix 40 mgrs QDAY for history of GERD . Continue use of CPAP at night to address Sleep Apnea.  Medical Decision Making:  Established Problem, Stable/Improving (1), Review of Psycho-Social Stressors (1), Review or order clinical lab tests (1) and Review of Medication Regimen & Side Effects (2)     COBOS, FERNANDO 09/16/2014, 3:24 PM

## 2014-09-16 NOTE — Progress Notes (Signed)
Recreation Therapy Notes  Date: 09/16/14 Time: 9:30am Location: [address] Group Room  Group Topic: Stress Management  Goal Area(s) Addresses:  Patient will verbalize importance of using healthy stress management.  Patient will identify positive emotions associated with healthy stress management.   Intervention: Progressive Muscle Relaxation Script  Activity :  Patient will listen as LRT leads them through progressive muscle relaxation.  Patients will be lead through each muscle group from head to toe.  Education:  Stress Management, Discharge Planning.   Education Outcome: Acknowledges edcuation/In group clarification offered/Needs additional education  Clinical Observations/Feedback: Patient did not attend.  Victorino Sparrow, LRT/CTRS         Victorino Sparrow A 09/16/2014 1:44 PM

## 2014-09-16 NOTE — Progress Notes (Signed)
Patient ID: [name], female   DOB: 09-22-1965, 49 y.o.   MRN: [id number]   Pt currently presents with a flat affect and depressed behavior. Per self inventory, pt rates depression at a 5, hopelessness 5 and anxiety 6. Pt's daily goal is "going to group and try to be more social" and they intend to do so by "go to group sessions." Pt reports * sleep, * concentration and a * appetite.   Pt provided with medications per providers orders. Pt's labs and vitals were monitored throughout the day. Pt supported emotionally and encouraged to express concerns and questions. Pt educated on medications, fall risk prevention and depression.  Pt's safety ensured with 15 minute and environmental checks. Pt currently denies SI/HI and A/V hallucinations. Pt verbally agrees to seek staff if SI/HI or A/VH occurs and to consult with staff before acting on these thoughts. Pt writes "I just want to get the right meds, to help me feel better." Pt has been more present in the milieu today and her affect is brighter.

## 2014-09-16 NOTE — Progress Notes (Signed)
Patient ID: [name], female   DOB: 04/28/1965, 49 y.o.   MRN: [id number] D: Patient stated she is doing well. Pt reports goal was to go to all groups but missed one group after lunch. Pt reports issues with son's girlfriend but hope the whole family will participate in therapy after discharge. Pt reports decreased anxiety and depressive symptoms this evening. Pt attended evening wrap up group and engaged in discussion. Cooperative with assessment.   A: Met with pt 1:1. Medications administered as prescribed. Writer encouraged pt to discuss feelings. Emotional support given and will continue to monitor pt's progress. Pt encouraged to come to staff with any questions or concerns.   R: Patient is safe on the unit. Pt denies SI/HI/AVH. She is complaint with medications and denies any adverse reaction.

## 2014-09-16 NOTE — Progress Notes (Signed)
Adult Psychoeducational Group Note  Date:  09/16/2014 Time:  9:09 PM  Group Topic/Focus:  Wrap-Up Group:   The focus of this group is to help patients review their daily goal of treatment and discuss progress on daily workbooks.  Participation Level:  Active  Participation Quality:  Appropriate and Attentive  Affect:  Appropriate  Cognitive:  Appropriate  Insight: Appropriate  Engagement in Group:  Engaged  Modes of Intervention:  Discussion  Additional Comments:  Pt stated she had a good day today. The doctor told her she can expect discharge on Friday which was good. Pt states she hopes that tomorrow will be a better day than today, and that she will work on being more sociable.   Clint Bolder 09/16/2014, 9:09 PM

## 2014-09-16 NOTE — BHH Suicide Risk Assessment (Signed)
[place] INPATIENT:  Family/Significant Other Suicide Prevention Education  Suicide Prevention Education:  Education Completed; Tyron Russell, Daughter, [phone];  has been identified by the patient as the family member/significant other with whom the patient will be residing, and identified as the person(s) who will aid the patient in the event of a mental health crisis (suicidal ideations/suicide attempt).  With written consent from the patient, the family member/significant other has been provided the following suicide prevention education, prior to the and/or following the discharge of the patient.  The suicide prevention education provided includes the following:  Suicide risk factors  Suicide prevention and interventions  National Suicide Hotline telephone number  [facility] assessment telephone number  [facility] Emergency Assistance [place] and/or Residential Mobile Crisis Unit telephone number  Request made of family/significant other to:  Remove weapons (e.g., guns, rifles, knives), all items previously/currently identified as safety concern.   Daughter advised patient does not have access to weapons.     Remove drugs/medications (over-the-counter, prescriptions, illicit drugs), all items previously/currently identified as a safety concern.  The family member/significant other verbalizes understanding of the suicide prevention education information provided.  The family member/significant other agrees to remove the items of safety concern listed above.  [name] 09/16/2014, 3:53 PM

## 2014-09-16 NOTE — BHH Group Notes (Signed)
[facility] LCSW Group Therapy  Emotional Regulation 1:15 - 2:30 PM  09/16/2014 2:49 PM  Type of Therapy:  Group Therapy  Participation Level:  Did Not Attend  [name] 09/16/2014, 2:49 PM

## 2014-09-16 NOTE — BHH Group Notes (Signed)
   [facility] LCSW Aftercare Discharge Planning Group Note  09/16/2014  8:45 AM   Participation Quality: Alert, Appropriate and Oriented  Mood/Affect: Depressed and Flat  Depression Rating: 5  Anxiety Rating: 5  Thoughts of Suicide: Pt denies SI/HI  Will you contract for safety? Yes  Current AVH: Pt denies  Plan for Discharge/Comments: Pt attended discharge planning group and actively participated in group. CSW provided pt with today's workbook. Patient reports feeling better today. She plans to discharge home and follow up with Dr. Harrington Challenger at [place].  Transportation Means: Pt reports access to transportation  Supports: No supports mentioned at this time  Tilden Fossa, MSW, [place] Social Worker [employer] [phone]

## 2014-09-17 LAB — GLUCOSE, CAPILLARY: Glucose-Capillary: 144 mg/dL — ABNORMAL HIGH (ref 65–99)

## 2014-09-17 MED ORDER — TRAZODONE HCL 150 MG PO TABS
75.0000 mg | ORAL_TABLET | Freq: Every evening | ORAL | Status: DC | PRN
  Administered 2014-09-17: 75 mg via ORAL
  Filled 2014-09-17: qty 1

## 2014-09-17 NOTE — BHH Group Notes (Signed)
[facility] LCSW Group Therapy  [facility] [place] [facility] 1:15 - 2:30 PM  09/17/2014   2:53 PM   Type of Therapy:  Group Therapy  Participation Level: Active  Participation Quality:  Attentive  Affect:  Appropriate  Cognitive:  Appropriate  Insight:  Developing/Improving   Engagement in Therapy:  Developing/Improving   Modes of Intervention:  Discussion, Education, Exploration, Problem-Solving, Rapport Building, Support   Summary of Progress/Problems:   Patient was attentive to speaker from the [employer] as he shared his story of dealing with mental health/substance abuse issues and overcoming it by working a recovery program.  Patient stated she wished there were a similar program in [place].  [name] 09/17/2014   2:53 PM

## 2014-09-17 NOTE — Progress Notes (Signed)
Patient ID: [name], female   DOB: 05-05-1965, 49 y.o.   MRN: [id number] [facility] MD Progress Note  09/17/2014 3:03 PM NANNA ERTLE  MRN:  [id number] Subjective:  Patient states she is feeling a little better. She is struggling with some increased " shame, embarrassment " because she has spoken with family and " I have a better idea about what I have put them through with my depression and my suicide attempt". States she has attempted suicide several times in the past, but had not attempted in 10 years. States " I just want to get better, I don't want to be this depressed person who wants to die anymore ".  At this time denies any medication side effects. She states " I feel the mediations are helping a little". She states " it seems like I have a little more energy than I have had".   Objective : Case discussed with treatment team and have met with patient. Patient has been going to groups and has been visible in dayroom, participative in milieu. As noted, states she is feeling medications are helping, and denies side effects.  Complains of some ongoing insomnia- last night slept 4-5 hours. No disruptive behaviors on unit. Denies medication side effects. As noted above, some increased ruminations about how her depression  And suicidal attempts have affected her family. She responds well to support, encouragement, empathy, review of her coping skills and improvement . Fingerstick glucose 144 today.   Principal Problem: MDD (major depressive disorder), recurrent episode, severe Diagnosis:   Patient Active Problem List   Diagnosis Date Noted  . MDD (major depressive disorder), recurrent episode, severe [F33.2] 09/13/2014  . Overdose [T50.901A] 09/12/2014  . Nasal congestion [R09.81] 05/03/2014  . Bloating [R14.0] 05/03/2014  . Acute on chronic respiratory failure with hypoxia [J96.21]   . Chest pain on respiration [R07.1] 03/25/2014  . Upper airway cough syndrome [R05]  02/28/2014  . Diarrhea [R19.7] 10/03/2013  . Abnormal drug screen [R89.2] 04/11/2013  . Sleep apnea [G47.30] 04/11/2013  . Left knee pain [M25.562] 04/03/2013  . Hyperkalemia [E87.5] 01/18/2013  . MRSA pneumonia [J15.212] 01/14/2013  . Perforated ear drum [H72.90] 05/22/2012  . Bell's palsy [G51.0] 01/23/2012  . Carpal tunnel syndrome [G56.00] 01/23/2012  . Carpal tunnel syndrome on both sides [G56.01, G56.02] 01/11/2012  . Pulmonary infiltrates [R91.8] 12/22/2011  . Chronic respiratory failure [J96.10] 10/16/2011  . Oxygen dependent [Z99.81] 10/16/2011  . Peripheral neuropathy [G62.9] 07/07/2011  . Joint pain [M25.50] 07/06/2011  . Vitamin D deficiency [268] 07/06/2011  . Asthmatic bronchitis [J45.909] 06/11/2011  . Back pain [M54.9] 06/11/2011  . Morbid obesity [E66.01] 05/06/2011  . Depression [F32.9] 05/04/2011  . Gastric erosions [K25.9] 12/05/2010  . HTN (hypertension) [I10] 12/02/2010  . DM (diabetes mellitus) type II controlled, neurological manifestation [E11.49] 12/02/2010  . Cigarette smoker [Z72.0] 12/02/2010  . Microcytic anemia [D50.9] 09/08/2010  . GERD (gastroesophageal reflux disease) [K21.9] 09/08/2010  . Esophageal dysphagia [R13.14] 09/08/2010   Total Time spent with patient: 20 minutes   Past Medical History:  Past Medical History  Diagnosis Date  . Diabetes mellitus   . Asthmatic bronchitis     normal PFT/ seen by pulmonary no evidence of COPD  . HTN (hypertension)   . Low back pain   . Tachycardia     never had test done since no insurance  . Depression   . Shortness of breath   . Anxiety   . Gastric erosions     EGD 08/2010.  [name]  Internal hemorrhoids     Colonoscopy 5/12.  [name] Heavy menses   . Chronic respiratory failure with hypoxia     On 2-3 L of oxygen at home  . GERD (gastroesophageal reflux disease)   . Anemia   . Arthritis   . Sleep apnea   . COPD (chronic obstructive pulmonary disease)     Past Surgical History  Procedure  Laterality Date  . Cholecystectomy  1990  . Cesarean section      twice  . Kidney surgery      as child for blockages  . Tubal ligation    . Tonsillectomy    . Wrist surgery  1995    Lt wrist  . Tympanostomy tube placement    . Hysteroscopy with thermachoice  01/17/2012    Procedure: HYSTEROSCOPY WITH THERMACHOICE;  Surgeon: Florian Buff, MD;  Location: [facility];  Service: Gynecology;  Laterality: N/A;  total therapy time: 9:13sec  D5W  18 ml in, D5W   29m out, temperture 87degrees celcious  . Uterine ablation    . Colonoscopy  09/16/2010    SKGY:JEHUDJcolon/small internal hemorrhoids  . Esophagogastroduodenoscopy  09/16/2010    SLF: normal/mild gastritis   Family History:  Family History  Problem Relation Age of Onset  . Heart attack Father 433   deceased, etoh use  . Heart disease Father   . Alcohol abuse Father   . Depression Father   . Heart attack Mother 529   deceased  . Diabetes Mother   . Breast cancer Mother   . Heart failure Mother     oxygen dependence, nonsmoker  . Heart disease Mother   . Depression Mother   . Cancer Mother   . Colon cancer Neg Hx   . Liver disease Maternal AAunt 59   died while on liver transplant list  . Heart attack Maternal Grandmother     premature CAD  . Ulcers Sister   . Hypertension Sister    Social History:  History  Alcohol Use  . 0.0 oz/week  . 0 Standard drinks or equivalent per week    Comment: social use     History  Drug Use  . Yes  . Special: Marijuana    History   Social History  . Marital Status: Single    Spouse Name: N/A  . Number of Children: 2  . Years of Education: N/A   Occupational History  . unemployed    Social History Main Topics  . Smoking status: Current Every Day Smoker -- 0.50 packs/day for 30 years    Types: Cigarettes  . Smokeless tobacco: Never Used     Comment: using the vapor cigarettes  . Alcohol Use: 0.0 oz/week    0 Standard drinks or equivalent per week     Comment: social use   . Drug Use: Yes    Special: Marijuana  . Sexual Activity: No   Other Topics Concern  . None   Social History Narrative   Additional History:    Sleep: fair   Appetite:  improved   Assessment:   Musculoskeletal: Strength & Muscle Tone: within normal limits Gait & Station: normal Patient leans: N/A   Psychiatric Specialty Exam: Physical Exam  ROS- denies nausea, vomiting , diarrhea,  Denies  Dizziness today .No headache today.(+)  Peripheral neuropathy  Blood pressure 134/63, pulse 85, temperature 98.7 F (37.1 C), temperature source Oral, resp. rate 18, weight 272 lb (123.378 kg), last menstrual period 08/01/2014,  SpO2 97 %.Body mass index is 46.67 kg/(m^2).  General Appearance: improved grooming  Eye Contact::  Good  Speech:  Normal Rate  Volume:  Decreased  Mood:   Mood is improving, less severely depressed   Affect:   Less constricted, more reactive affect, states still having brief episodes of tearfulness, but acknowledges " I've been smiling more "  Thought Process:  Goal Directed and Linear  Orientation:  Other:  fully alert and attentive   Thought Content:  No hallucinations, no delusions  Suicidal Thoughts:  No- at this time denies any thoughts of hurting self on the unit and contracts for safety on the unit.   Homicidal Thoughts:  No  Memory:  recent and remote grossly intact   Judgement:  Other:  fair   Insight:  present   Psychomotor Activity:  Decreased and but improving  Concentration:  Good  Recall:  Good  Fund of Knowledge:Good  Language: Good  Akathisia:  Negative  Handed:  Right  AIMS (if indicated):     Assets:  Desire for Improvement Resilience  ADL's:  Fair   Cognition: WNL  Sleep:  Number of Hours: 4.75     Current Medications: Current Facility-Administered Medications  Medication Dose Route Frequency Provider Last Rate Last Dose  . acetaminophen (TYLENOL) tablet 650 mg  650 mg Oral Q6H PRN Dara Hoyer, PA-C   650 mg at 09/17/14  1424  . albuterol (PROVENTIL HFA;VENTOLIN HFA) 108 (90 BASE) MCG/ACT inhaler 2 puff  2 puff Inhalation Q4H PRN Dara Hoyer, PA-C      . albuterol (PROVENTIL) (2.5 MG/3ML) 0.083% nebulizer solution 2.5 mg  2.5 mg Nebulization Q4H PRN Dara Hoyer, PA-C      . alum & mag hydroxide-simeth (MAALOX/MYLANTA) 200-200-20 MG/5ML suspension 30 mL  30 mL Oral Q4H PRN Dara Hoyer, PA-C      . ARIPiprazole (ABILIFY) tablet 2 mg  2 mg Oral Daily Kathlee Nations, MD   2 mg at 09/17/14 0810  . atorvastatin (LIPITOR) tablet 10 mg  10 mg Oral QHS Dara Hoyer, PA-C   10 mg at 09/16/14 2219  . bisoprolol (ZEBETA) tablet 10 mg  10 mg Oral Daily Dara Hoyer, PA-C   10 mg at 09/17/14 0810  . canagliflozin (INVOKANA) tablet 100 mg  100 mg Oral Daily Dara Hoyer, PA-C   100 mg at 09/17/14 0810  . DULoxetine (CYMBALTA) DR capsule 60 mg  60 mg Oral BID Jenne Campus, MD   60 mg at 09/17/14 0810  . famotidine (PEPCID) tablet 20 mg  20 mg Oral QHS Dara Hoyer, PA-C   20 mg at 09/16/14 2219  . hydrOXYzine (ATARAX/VISTARIL) tablet 25 mg  25 mg Oral Q4H PRN Encarnacion Slates, NP   25 mg at 09/17/14 1424  . linagliptin (TRADJENTA) tablet 5 mg  5 mg Oral Daily Dara Hoyer, PA-C   5 mg at 09/17/14 0810  . magnesium hydroxide (MILK OF MAGNESIA) suspension 30 mL  30 mL Oral Daily PRN Dara Hoyer, PA-C      . nicotine (NICODERM CQ - dosed in mg/24 hours) patch 21 mg  21 mg Transdermal Daily Jenne Campus, MD   21 mg at 09/17/14 0807  . pantoprazole (PROTONIX) EC tablet 40 mg  40 mg Oral Daily Dara Hoyer, PA-C   40 mg at 09/17/14 0810  . pregabalin (LYRICA) capsule 100 mg  100 mg Oral TID Dara Hoyer, PA-C  100 mg at 09/17/14 1159  . propranolol (INDERAL) tablet 10 mg  10 mg Oral TID Encarnacion Slates, NP   10 mg at 09/17/14 1159  . roflumilast (DALIRESP) tablet 500 mcg  500 mcg Oral Daily Dara Hoyer, PA-C   500 mcg at 09/17/14 0810  . traZODone (DESYREL) tablet 50 mg  50 mg Oral QHS PRN  Jenne Campus, MD   50 mg at 09/16/14 2219    Lab Results:  Results for orders placed or performed during the hospital encounter of 09/12/14 (from the past 48 hour(s))  Glucose, capillary     Status: Abnormal   Collection Time: 09/16/14  6:26 AM  Result Value Ref Range   Glucose-Capillary 143 (H) 65 - 99 mg/dL  Glucose, capillary     Status: Abnormal   Collection Time: 09/17/14  6:24 AM  Result Value Ref Range   Glucose-Capillary 144 (H) 65 - 99 mg/dL    Physical Findings: AIMS: Facial and Oral Movements Muscles of Facial Expression: None, normal Lips and Perioral Area: None, normal Jaw: None, normal Tongue: None, normal,Extremity Movements Upper (arms, wrists, hands, fingers): None, normal Lower (legs, knees, ankles, toes): None, normal, Trunk Movements Neck, shoulders, hips: None, normal, Overall Severity Severity of abnormal movements (highest score from questions above): None, normal Incapacitation due to abnormal movements: None, normal Patient's awareness of abnormal movements (rate only patient's report): No Awareness, Dental Status Current problems with teeth and/or dentures?: No Does patient usually wear dentures?: No  CIWA:  CIWA-Ar Total: 1 COWS:  COWS Total Score: 2  Assessment-  Depression and neuro-vegetative symptoms are gradually improving- presents with fuller range of affect, improved grooming, improved verbal output. Does report some insomnia, and  today struggling with some increased shame related to how her suicidal attempt has affected her family. She responds well to support/empathy. Today no suicidal ideations. She is tolerating medications well, and has tolerated Abilify ( new medication ) and Cymbalta ( dose was titrated up ) well thus far.   Treatment Plan Summary: Continue Cymbalta to 60 mgrs BID to address ongoing depression/ chronic pain Continue Abilify 2 mgrs QDAY as augmentation strategy to address depression. Continue Lyrica 100 mgrs TID to  address chronic pain/peripheral neuropathy Continue Tradjenta, Invokana for DM Continue  Proventil inhaler PRN and Daliresp for COPD history.  Continue Protonix 40 mgrs QDAY for history of GERD . Continue use of CPAP at night to address Sleep Apnea. Increase Trazodone to 75 mgrs QHS PRN for ongoing insomnia .  Medical Decision Making:  Established Problem, Stable/Improving (1), Review of Psycho-Social Stressors (1), Review or order clinical lab tests (1) and Review of Medication Regimen & Side Effects (2)     COBOS, FERNANDO 09/17/2014, 3:03 PM

## 2014-09-17 NOTE — Plan of Care (Signed)
Problem: Alteration in mood Goal: STG-Pt Able to Identify Plan For Continuing Care at D/C Pt. Will be able to identify a plan for continuing care at discharge  Outcome: Progressing Pt plans to live with sister till she can get her own place. Pt will participate in counseling with children.

## 2014-09-17 NOTE — Progress Notes (Signed)
Recreation Therapy Notes  Animal-Assisted Activity (AAA) Program Checklist/Progress Notes Patient Eligibility Criteria Checklist & Daily Group note for Rec Tx Intervention  Date: 05.26.16 Time: 2:45pm Location: [address] [employer]   AAA/T Program Assumption of Risk Form signed by Patient/ or Parent Legal Guardian yes  Patient is free of allergies or sever asthma yes  Patient reports no fear of animals yes  Patient reports no history of cruelty to animalsyes  Patient understands his/her participation is voluntary yes  Patient washes hands before animal contact yes  Patient washes hands after animal contact yes  Education: Hand Washing, Appropriate Animal Interaction   Education Outcome: Acknowledges understanding/In group clarification offered/Needs additional education.   Clinical Observations/Feedback: Patient did not attend group.  Victorino Sparrow, LRT/CTRS         Victorino Sparrow A 09/17/2014 4:11 PM

## 2014-09-17 NOTE — Progress Notes (Signed)
Patient ID: Ysidro Evert, female   DOB: 08/21/1965, 49 y.o.   MRN: [id number] Adult Psychoeducational Group Note  Date:  09/17/2014 Time: 09:15am  Group Topic/Focus:  Orientation:   The focus of this group is to educate the patient on the purpose and policies of crisis stabilization and provide a format to answer questions about their admission.  The group details unit policies and expectations of patients while admitted. Self Care:   The focus of this group is to help patients understand the importance of self-care in order to improve or restore emotional, physical, spiritual, interpersonal, and financial health.  Participation Level:  Active  Participation Quality:  Attentive  Affect:  Flat  Cognitive:  Alert and Oriented  Insight: Improving  Engagement in Group:  Engaged  Modes of Intervention:  Activity, Discussion, Education and Support  Additional Comments: Pt able to identify strengths in self care. Pt identified one healthy aspect of leisure and lifestyle changes to incorporate post discharge.   Elenore Rota 09/17/2014, 10:03 AM

## 2014-09-17 NOTE — Progress Notes (Signed)
Patient ID: Ysidro Evert, female   DOB: 07-Aug-1965, 49 y.o.   MRN: [id number] D: Patient apprehensive about discharge this evening. Pt report she wants her own place but has to live with sister for the moment to help her out. Pt reports decreased anxiety and depressive symptoms this evening. Pt attended evening karaoke this evening and was supportive of peers. Cooperative with assessment.   A: Met with pt 1:1. Medications administered as prescribed. Writer encouraged pt to discuss feelings. Emotional support given and will continue to monitor pt's progress. Pt encouraged to come to staff with any questions or concerns.   R: Patient is safe on the unit. Pt denies SI/HI/AVH. She is complaint with medications and denies any adverse reaction.

## 2014-09-17 NOTE — Plan of Care (Signed)
Problem: Alteration in mood; excessive anxiety as evidenced by: Goal: STG-Pt will report an absence of self-harm thoughts/actions (Patient will report an absence of self-harm thoughts or actions)  Outcome: Completed/Met Date Met:  09/17/14 Pt denies SI today

## 2014-09-17 NOTE — Progress Notes (Signed)
Patient ID: [name], female   DOB: [birth year], 49 y.o.   MRN: [id number]   Pt currently presents with a flat affect and anxious behavior. Per self inventory, pt rates depression at a 4, hopelessness 4 and anxiety6. Pt's daily goal is to "working on going to groups, being sociable and getting ready for discharge" and they intend to do so by "participating in groups and letting family know some of my needs." Pt asks to settle discharge plans and "maybe some family therapy appointments for discharge." Pt reports poor sleep, good concentration and a good appetite.   Pt provided with medications per providers orders. Pt's labs and vitals were monitored throughout the day. Pt supported emotionally and encouraged to express concerns and questions. Pt educated on medications. Pt given a 1:1.  Pt's safety ensured with 15 minute and environmental checks. Pt currently denies SI/HI and A/V hallucinations. Pt verbally agrees to seek staff if SI/HI or A/VH occurs and to consult with staff before acting on these thoughts. Pt is taking an active part in discharge plans.

## 2014-09-18 DIAGNOSIS — F332 Major depressive disorder, recurrent severe without psychotic features: Secondary | ICD-10-CM | POA: Insufficient documentation

## 2014-09-18 LAB — GLUCOSE, CAPILLARY: Glucose-Capillary: 147 mg/dL — ABNORMAL HIGH (ref 65–99)

## 2014-09-18 MED ORDER — LINAGLIPTIN 5 MG PO TABS: 5.0000 mg | ORAL_TABLET | Freq: Every day | ORAL | Status: DC

## 2014-09-18 MED ORDER — ALBUTEROL SULFATE HFA 108 (90 BASE) MCG/ACT IN AERS: 2.0000 | INHALATION_SPRAY | RESPIRATORY_TRACT | Status: DC | PRN

## 2014-09-18 MED ORDER — ALBUTEROL SULFATE (2.5 MG/3ML) 0.083% IN NEBU: 2.5000 mg | INHALATION_SOLUTION | RESPIRATORY_TRACT | Status: DC | PRN

## 2014-09-18 MED ORDER — NICOTINE 21 MG/24HR TD PT24: 21.0000 mg | MEDICATED_PATCH | Freq: Every day | TRANSDERMAL | Status: DC

## 2014-09-18 MED ORDER — DULOXETINE HCL 60 MG PO CPEP: 60.0000 mg | ORAL_CAPSULE | Freq: Two times a day (BID) | ORAL | Status: DC

## 2014-09-18 MED ORDER — PREGABALIN 100 MG PO CAPS: 100.0000 mg | ORAL_CAPSULE | Freq: Three times a day (TID) | ORAL | Status: DC

## 2014-09-18 MED ORDER — BISOPROLOL FUMARATE 10 MG PO TABS: 10.0000 mg | ORAL_TABLET | Freq: Every day | ORAL | Status: DC

## 2014-09-18 MED ORDER — ATORVASTATIN CALCIUM 10 MG PO TABS: 10.0000 mg | ORAL_TABLET | Freq: Every day | ORAL | Status: DC

## 2014-09-18 MED ORDER — CHLORPHEN-PE-ACETAMINOPHEN 4-10-325 MG PO TABS: ORAL_TABLET | ORAL | Status: DC

## 2014-09-18 MED ORDER — ROFLUMILAST 500 MCG PO TABS: 500.0000 ug | ORAL_TABLET | Freq: Every day | ORAL | Status: DC

## 2014-09-18 MED ORDER — FERRALET 90 90-1 MG PO TABS: ORAL_TABLET | ORAL | Status: DC

## 2014-09-18 MED ORDER — PANTOPRAZOLE SODIUM 40 MG PO TBEC: 40.0000 mg | DELAYED_RELEASE_TABLET | Freq: Every day | ORAL | Status: DC

## 2014-09-18 MED ORDER — CANAGLIFLOZIN 100 MG PO TABS: 100.0000 mg | ORAL_TABLET | Freq: Every day | ORAL | Status: DC

## 2014-09-18 MED ORDER — ARIPIPRAZOLE 2 MG PO TABS: 2.0000 mg | ORAL_TABLET | Freq: Every day | ORAL | Status: DC

## 2014-09-18 NOTE — Progress Notes (Signed)
D: Patient discharged home per MD order.  Patient received all personal belongings, prescriptions and medication samples.  Patient denies SI/HI/AVH.  Reviewed discharge instructions, medications and follow ups with patient and he indicated understanding.  Patient left ambulatory for the bus station.

## 2014-09-18 NOTE — Progress Notes (Signed)
  [facility] Adult Case Management Discharge Plan :  Will you be returning to the same living situation after discharge:  Yes,  Patient is returning to her home. At discharge, do you have transportation home?: Yes,  Patient has arranged transportation. Do you have the ability to pay for your medications: Yes,  Patient is able to afford medications.  Release of information consent forms completed and in the chart;  Patient's signature needed at discharge.  Patient to Follow up at: Follow-up Information    Follow up with Dr. Harrington Challenger [facility] On 10/15/2014.   Why:  October 15, 2014 at 9 AM.  You will be place a cancellation list and will be called for an earlier appointment should one come available   Contact information:   [address] [place], [place]  [phone]        Follow up with [facility].   Why:  Patient will schedule with PCP   Contact information:   [place], [place]   [address]  [phone]      Please follow up.   Why:  Per [employer], you are eligible for one month of free case management.  If you are interested in this service,  please contact Sophronia Simas at [phone] x [phone]       Follow up with Dr. Moses Manners [facility] On 10/08/2014.   Why:  Thursday, June 16 at 2:45 PM.  You will be placed on cancellation list for an ealier appointment   Contact information:   [address] [place], [place]   [address]  [phone]      Patient denies SI/HI: Patient no longer endorsing SI/HI or other thoughts of self harm.   Safety Planning and Suicide Prevention discussed: .Reviewed with all patients during discharge planning group    Has patient been referred to the Quitline?:Patient declined referral to Quitline.   [name] 09/18/2014, 11:17 AM

## 2014-09-18 NOTE — BHH Suicide Risk Assessment (Signed)
[facility] Discharge Suicide Risk Assessment   Demographic Factors:  49 year old female, lives at home with sister. On Disability  Total Time spent with patient: 30 minutes  Musculoskeletal: Strength & Muscle Tone: within normal limits Gait & Station: normal Patient leans: N/A  Psychiatric Specialty Exam: Physical Exam  ROS  Blood pressure 134/67, pulse 77, temperature 98.1 F (36.7 C), temperature source Oral, resp. rate 16, weight 272 lb (123.378 kg), last menstrual period 08/01/2014, SpO2 97 %.Body mass index is 46.67 kg/(m^2).  General Appearance: Well Groomed  Eye Contact::  Good  Speech:  Normal Rate409  Volume:  Normal  Mood:  improved and at this time does not feel depressed , affect much fuller in range, reactive   Affect:  Full Range  Thought Process:  Linear  Orientation:  Full (Time, Place, and Person)  Thought Content:  no hallucinations, no delusions  Suicidal Thoughts:  No  Homicidal Thoughts:  No  Memory:  recent and remote grossly intact   Judgement:  Other:  improved  Insight:  improved  Psychomotor Activity:  Normal  Concentration:  Good  Recall:  Good  Fund of Knowledge:Good  Language: Good  Akathisia:  Negative  Handed:  Right  AIMS (if indicated):     Assets:  Communication Skills Desire for Improvement Housing Resilience  Sleep:  Number of Hours: 6  Cognition: WNL  ADL's:  Improved       Has this patient used any form of tobacco in the last 30 days? (Cigarettes, Smokeless Tobacco, Cigars, and/or Pipes) Yes, A prescription for an FDA-approved tobacco cessation medication was offered at discharge and the patient refused  Mental Status Per Nursing Assessment::   On Admission:     Current Mental Status by Physician: At this time patient is much improved compared to her admission- she is well groomed , has good eye contact, mood is improved and today does not feel depressed, affect is full in range and she is smiling appropriately, no thought  disorder, no SI , no HI, no psychotic symptoms, future oriented .  Loss Factors: Pain, on disability  Historical Factors: Has history of depression, with prior admissions for depression. History of suicide attempts .  Risk Reduction Factors:   Sense of responsibility to family, Living with another person, especially a relative, Positive social support and Positive coping skills or problem solving skills  Continued Clinical Symptoms:  As noted, at this time much improved compared to admission status - mood and affect much improved, no SI. Denying any medication side effects at present .  Cognitive Features That Contribute To Risk:  No gross cognitive deficits noted upon discharge. Is alert , attentive, and oriented x 3   Suicide Risk:  Mild:  Suicidal ideation of limited frequency, intensity, duration, and specificity.  There are no identifiable plans, no associated intent, mild dysphoria and related symptoms, good self-control (both objective and subjective assessment), few other risk factors, and identifiable protective factors, including available and accessible social support.  Principal Problem: MDD (major depressive disorder), recurrent episode, severe Discharge Diagnoses:  Patient Active Problem List   Diagnosis Date Noted  . MDD (major depressive disorder), recurrent episode, severe [F33.2] 09/13/2014  . Overdose [T50.901A] 09/12/2014  . Nasal congestion [R09.81] 05/03/2014  . Bloating [R14.0] 05/03/2014  . Acute on chronic respiratory failure with hypoxia [J96.21]   . Chest pain on respiration [R07.1] 03/25/2014  . Upper airway cough syndrome [R05] 02/28/2014  . Diarrhea [R19.7] 10/03/2013  . Abnormal drug screen [R89.2]  04/11/2013  . Sleep apnea [G47.30] 04/11/2013  . Left knee pain [M25.562] 04/03/2013  . Hyperkalemia [E87.5] 01/18/2013  . MRSA pneumonia [J15.212] 01/14/2013  . Perforated ear drum [H72.90] 05/22/2012  . Bell's palsy [G51.0] 01/23/2012  . Carpal tunnel  syndrome [G56.00] 01/23/2012  . Carpal tunnel syndrome on both sides [G56.01, G56.02] 01/11/2012  . Pulmonary infiltrates [R91.8] 12/22/2011  . Chronic respiratory failure [J96.10] 10/16/2011  . Oxygen dependent [Z99.81] 10/16/2011  . Peripheral neuropathy [G62.9] 07/07/2011  . Joint pain [M25.50] 07/06/2011  . Vitamin D deficiency [268] 07/06/2011  . Asthmatic bronchitis [J45.909] 06/11/2011  . Back pain [M54.9] 06/11/2011  . Morbid obesity [E66.01] 05/06/2011  . Depression [F32.9] 05/04/2011  . Gastric erosions [K25.9] 12/05/2010  . HTN (hypertension) [I10] 12/02/2010  . DM (diabetes mellitus) type II controlled, neurological manifestation [E11.49] 12/02/2010  . Cigarette smoker [Z72.0] 12/02/2010  . Microcytic anemia [D50.9] 09/08/2010  . GERD (gastroesophageal reflux disease) [K21.9] 09/08/2010  . Esophageal dysphagia [R13.14] 09/08/2010    Follow-up Information    Follow up with Dr. Harrington Challenger - [facility] On 10/15/2014.   Why:  October 15, 2014 at 9 AM.  You will be place a cancellation list and will be called for an earlier appointment should one come available   Contact information:   [address] [place], [place]  [phone]        Follow up with [facility].   Why:  Patient will schedule with PCP   Contact information:   [address] [place], [place]   [address]  [phone]      Please follow up.   Why:  Per [employer], you are eligible for one month of free case management.  If you are interested in this service,  please contact Sophronia Simas at [phone]       Plan Of Care/Follow-up recommendations:  Activity:  as tolerated Diet:  low sodium, heart healthy, diabetic diet  Tests:  NA Other:  see below  Is patient on multiple antipsychotic therapies at discharge:  No   Has Patient had three or more failed trials of antipsychotic monotherapy by history:  No  Recommended Plan for Multiple Antipsychotic  Therapies: NA  Patient is being discharged in good spirits. Plans to go home- daughter will be picking her up. Follow up as above . Has established PCP for ongoing management of chronic  Medical illnesses .  [name] 09/18/2014, 10:56 AM

## 2014-09-18 NOTE — Tx Team (Signed)
Interdisciplinary Treatment Plan Update (Adult)  Date:  09/18/2014  Time Reviewed:  10:33 AM   Progress in Treatment: Attending groups: Patient is attending groups. Participating in groups:  Patient engages in discussion Taking medication as prescribed:  Patient is taking medications Tolerating medication:  Patient is tolerating medications Family/Significant othe contact made:   Yes, collateral contact with daughter. Patient understands diagnosis:Yes, patient understands diagnosis and need for treatment Discussing patient identified problems/goals with staff:  Yes, patient is able to express goals/problems Medical problems stabilized or resolved:  Yes Denies suicidal/homicidal ideation: Yes, patient is denying SI/HI. Issues/concerns per patient self-inventory:   Other:  Discharge Plan or Barriers:  Discharge home with follow up [facility]  Reason for Continuation of Hospitalization:  Comments:  Additional comments:  Patient and CSW reviewed Patient Discharge Process Letter/Patient Involvement Form.  Patient verbalized understanding and signed form.  Patient and CSW also reviewed and identified patient's goals and treatment plan.  Patient verbalized understanding and agreed to plan.  Estimated length of stay:  Discharge today  New goal(s):  Review of initial/current patient goals per problem list:  Please see plan of careInterdisciplinary Treatment Plan Update (Adult)  Attendees: Patient 09/18/2014 10:33 AM   Family:   09/18/2014 10:33 AM   Physician:  Neita Garnet, MD 09/18/2014 10:33 AM   Nursing:   Mayra Neer, RN 09/18/2014 10:33 AM   Clinical Social Worker:  Joette Catching, LCSW 09/18/2014 10:33 AM   Clinical Social Worker:  Erasmo Downer Drinkard, LCSW-A 09/18/2014 10:33 AM   Case Manager:  Lars Pinks, RN 09/18/2014 10:33 AM   Other:  Para March, RN 09/18/2014 10:33 AM  Other:   09/18/2014  10:33 AM   Other:  09/18/2014 10:33 AM   Other:  09/18/2014 10:33 AM    Other:  09/18/2014 10:33 AM   Other:  Jake Bathe Transition Team Coordinator 09/18/2014 10:33 AM   Other:   09/18/2014 10:33 AM   Other:  09/18/2014 10:33 AM   Other:   09/18/2014 10:33 AM    Scribe for Treatment Team:   [name], 09/18/2014   10:33 AM

## 2014-09-18 NOTE — BHH Group Notes (Signed)
[facility] LCSW Aftercare Discharge Planning Group Note   09/18/2014 10:00 AM    Participation Quality:  Appropraite  Mood/Affect:  Appropriate  Depression Rating:  2  Anxiety Rating:  3  Thoughts of Suicide:  No  Will you contract for safety?   NA  Current AVH:  No  Plan for Discharge/Comments:  Patient attended discharge planning group and actively participated in group. She is looking forward to discharging today and will follow up with [facility].  Suicide prevention education reviewed and SPE document provided.   Transportation Means: Patient has transportation.   Supports:  Patient has a support system.   Gowri Suchan, Eulas Post

## 2014-09-18 NOTE — Discharge Summary (Signed)
Physician Discharge Summary Note  Patient:  [name] is an 49 y.o., female MRN:  [id number] DOB:  May 23, 1965 Patient phone:  [phone] (home)  Patient address:   [address] [place] [address],  Total Time spent with patient: 45 minutes  Date of Admission:  09/12/2014 Date of Discharge: 09/18/2014  Reason for Admission:  depression  Principal Problem: MDD (major depressive disorder), recurrent episode, severe Discharge Diagnoses: Patient Active Problem List   Diagnosis Date Noted  . Major depressive disorder, recurrent, severe without psychotic features [F33.2]   . MDD (major depressive disorder), recurrent episode, severe [F33.2] 09/13/2014  . Overdose [T50.901A] 09/12/2014  . Nasal congestion [R09.81] 05/03/2014  . Bloating [R14.0] 05/03/2014  . Acute on chronic respiratory failure with hypoxia [J96.21]   . Chest pain on respiration [R07.1] 03/25/2014  . Upper airway cough syndrome [R05] 02/28/2014  . Diarrhea [R19.7] 10/03/2013  . Abnormal drug screen [R89.2] 04/11/2013  . Sleep apnea [G47.30] 04/11/2013  . Left knee pain [M25.562] 04/03/2013  . Hyperkalemia [E87.5] 01/18/2013  . MRSA pneumonia [J15.212] 01/14/2013  . Perforated ear drum [H72.90] 05/22/2012  . Bell's palsy [G51.0] 01/23/2012  . Carpal tunnel syndrome [G56.00] 01/23/2012  . Carpal tunnel syndrome on both sides [G56.01, G56.02] 01/11/2012  . Pulmonary infiltrates [R91.8] 12/22/2011  . Chronic respiratory failure [J96.10] 10/16/2011  . Oxygen dependent [Z99.81] 10/16/2011  . Peripheral neuropathy [G62.9] 07/07/2011  . Joint pain [M25.50] 07/06/2011  . Vitamin D deficiency [268] 07/06/2011  . Asthmatic bronchitis [J45.909] 06/11/2011  . Back pain [M54.9] 06/11/2011  . Morbid obesity [E66.01] 05/06/2011  . Depression [F32.9] 05/04/2011  . Gastric erosions [K25.9] 12/05/2010  . HTN (hypertension) [I10] 12/02/2010  . DM (diabetes mellitus) type II controlled, neurological manifestation  [E11.49] 12/02/2010  . Cigarette smoker [Z72.0] 12/02/2010  . Microcytic anemia [D50.9] 09/08/2010  . GERD (gastroesophageal reflux disease) [K21.9] 09/08/2010  . Esophageal dysphagia [R13.14] 09/08/2010    Musculoskeletal: Strength & Muscle Tone: within normal limits Gait & Station: normal Patient leans: N/A  Psychiatric Specialty Exam:  SEE SRA Physical Exam  Vitals reviewed.   Review of Systems  Constitutional: Negative for fever.  Eyes: Negative for blurred vision.  Respiratory: Negative for cough.   Cardiovascular: Negative for chest pain.  Skin: Negative for rash.  Psychiatric/Behavioral: Positive for depression.  All other systems reviewed and are negative.   Blood pressure 134/67, pulse 77, temperature 98.1 F (36.7 C), temperature source Oral, resp. rate 16, weight 123.378 kg (272 lb), last menstrual period 08/01/2014, SpO2 97 %.Body mass index is 46.67 kg/(m^2).     Has this patient used any form of tobacco in the last 30 days? (Cigarettes, Smokeless Tobacco, Cigars, and/or Pipes) Yes, Prescription not provided because: samples and RX given  Past Medical History:  Past Medical History  Diagnosis Date  . Diabetes mellitus   . Asthmatic bronchitis     normal PFT/ seen by pulmonary no evidence of COPD  . HTN (hypertension)   . Low back pain   . Tachycardia     never had test done since no insurance  . Depression   . Shortness of breath   . Anxiety   . Gastric erosions     EGD 08/2010.  . Internal hemorrhoids     Colonoscopy 5/12.  [name] Heavy menses   . Chronic respiratory failure with hypoxia     On 2-3 L of oxygen at home  . GERD (gastroesophageal reflux disease)   . Anemia   . Arthritis   .  Sleep apnea   . COPD (chronic obstructive pulmonary disease)     Past Surgical History  Procedure Laterality Date  . Cholecystectomy  1990  . Cesarean section      twice  . Kidney surgery      as child for blockages  . Tubal ligation    . Tonsillectomy    .  Wrist surgery  1995    Lt wrist  . Tympanostomy tube placement    . Hysteroscopy with thermachoice  01/17/2012    Procedure: HYSTEROSCOPY WITH THERMACHOICE;  Surgeon: Florian Buff, MD;  Location: [facility];  Service: Gynecology;  Laterality: N/A;  total therapy time: 9:13sec  D5W  18 ml in, D5W   40ml out, temperture 87degrees celcious  . Uterine ablation    . Colonoscopy  09/16/2010    VQM:GQQPYP colon/small internal hemorrhoids  . Esophagogastroduodenoscopy  09/16/2010    SLF: normal/mild gastritis   Family History:  Family History  Problem Relation Age of Onset  . Heart attack Father 60    deceased, etoh use  . Heart disease Father   . Alcohol abuse Father   . Depression Father   . Heart attack Mother 71    deceased  . Diabetes Mother   . Breast cancer Mother   . Heart failure Mother     oxygen dependence, nonsmoker  . Heart disease Mother   . Depression Mother   . Cancer Mother   . Colon cancer Neg Hx   . Liver disease Maternal Aunt 39    died while on liver transplant list  . Heart attack Maternal Grandmother     premature CAD  . Ulcers Sister   . Hypertension Sister    Social History:  History  Alcohol Use  . 0.0 oz/week  . 0 Standard drinks or equivalent per week    Comment: social use     History  Drug Use  . Yes  . Special: Marijuana    History   Social History  . Marital Status: Single    Spouse Name: N/A  . Number of Children: 2  . Years of Education: N/A   Occupational History  . unemployed    Social History Main Topics  . Smoking status: Current Every Day Smoker -- 0.50 packs/day for 30 years    Types: Cigarettes  . Smokeless tobacco: Never Used     Comment: using the vapor cigarettes  . Alcohol Use: 0.0 oz/week    0 Standard drinks or equivalent per week     Comment: social use  . Drug Use: Yes    Special: Marijuana  . Sexual Activity: No   Other Topics Concern  . None   Social History Narrative  Risk to Self: Is patient at risk for  suicide?: Yes What has been your use of drugs/alcohol within the last 12 months?: Uses marijuana 3 times a week, cannot afford daily.  Alcohol makes her stomach hurt so does not drink.  Used a lot of cocaine during one period of her life in 2003 when she was dating someone. Risk to Others:   Prior Inpatient Therapy:   Prior Outpatient Therapy:    Level of Care:  OP  Hospital Course:  Priscilla A [name] was admitted for MDD (major depressive disorder), recurrent episode, severe and crisis management.  She was treated discharged with the medications listed below under Medication List.  Medical problems were identified and treated as needed.  Home medications were restarted as appropriate.  Improvement  was monitored by observation and Angellina A [name] daily report of symptom reduction.  Emotional and mental status was monitored by daily self-inventory reports completed by Ysidro Evert and clinical staff.         Jayna A [name] was evaluated by the treatment team for stability and plans for continued recovery upon discharge.  Henretter A [name] motivation was an integral factor for scheduling further treatment.  Employment, transportation, bed availability, health status, family support, and any pending legal issues were also considered during her hospital stay.  She was offered further treatment options upon discharge including but not limited to Residential, Intensive Outpatient, and Outpatient treatment.  Donnis A Marcelline Deist will follow up with the services as listed below under Follow Up Information.     Upon completion of this admission the patient was both mentally and medically stable for discharge denying suicidal/homicidal ideation, auditory/visual/tactile hallucinations, delusional thoughts and paranoia.      Consults:  psychiatry  Significant Diagnostic Studies:  labs: per ED  Discharge Vitals:   Blood pressure 134/67, pulse 77, temperature 98.1 F (36.7 C), temperature source  Oral, resp. rate 16, weight 123.378 kg (272 lb), last menstrual period 08/01/2014, SpO2 97 %. Body mass index is 46.67 kg/(m^2). Lab Results:   Results for orders placed or performed during the hospital encounter of 09/12/14 (from the past 72 hour(s))  Glucose, capillary     Status: Abnormal   Collection Time: 09/17/14  6:24 AM  Result Value Ref Range   Glucose-Capillary 144 (H) 65 - 99 mg/dL  Glucose, capillary     Status: Abnormal   Collection Time: 09/18/14  5:21 AM  Result Value Ref Range   Glucose-Capillary 147 (H) 65 - 99 mg/dL    Physical Findings: AIMS: Facial and Oral Movements Muscles of Facial Expression: None, normal Lips and Perioral Area: None, normal Jaw: None, normal Tongue: None, normal,Extremity Movements Upper (arms, wrists, hands, fingers): None, normal Lower (legs, knees, ankles, toes): None, normal, Trunk Movements Neck, shoulders, hips: None, normal, Overall Severity Severity of abnormal movements (highest score from questions above): None, normal Incapacitation due to abnormal movements: None, normal Patient's awareness of abnormal movements (rate only patient's report): No Awareness, Dental Status Current problems with teeth and/or dentures?: No Does patient usually wear dentures?: No  CIWA:  CIWA-Ar Total: 1 COWS:  COWS Total Score: 2   See Psychiatric Specialty Exam and Suicide Risk Assessment completed by Attending Physician prior to discharge.  Discharge destination:  Home  Is patient on multiple antipsychotic therapies at discharge:  No   Has Patient had three or more failed trials of antipsychotic monotherapy by history:  No    Recommended Plan for Multiple Antipsychotic Therapies: NA     Medication List    STOP taking these medications        ALPRAZolam 0.5 MG tablet  Commonly known as:  XANAX     amphetamine-dextroamphetamine 20 MG tablet  Commonly known as:  ADDERALL     famotidine 20 MG tablet  Commonly known as:  PEPCID      fluconazole 150 MG tablet  Commonly known as:  DIFLUCAN     FLUTTER Devi     ibuprofen 200 MG tablet  Commonly known as:  ADVIL,MOTRIN     sitaGLIPtin 100 MG tablet  Commonly known as:  JANUVIA     sulfamethoxazole-trimethoprim 800-160 MG per tablet  Commonly known as:  BACTRIM DS,SEPTRA DS      TAKE these medications  Indication   albuterol 108 (90 BASE) MCG/ACT inhaler  Commonly known as:  PROVENTIL HFA  Inhale 2 puffs into the lungs every 4 (four) hours as needed for wheezing or shortness of breath.   Indication:  Chronic Obstructive Lung Disease     albuterol (2.5 MG/3ML) 0.083% nebulizer solution  Commonly known as:  PROVENTIL  Take 3 mLs (2.5 mg total) by nebulization every 4 (four) hours as needed for wheezing.   Indication:  Acute Bronchospasm     ARIPiprazole 2 MG tablet  Commonly known as:  ABILIFY  Take 1 tablet (2 mg total) by mouth daily.   Indication:  Mood Stabilization     atorvastatin 10 MG tablet  Commonly known as:  LIPITOR  Take 1 tablet (10 mg total) by mouth at bedtime.   Indication:  High Amount of Triglycerides in the Blood     bisoprolol 10 MG tablet  Commonly known as:  ZEBETA  Take 1 tablet (10 mg total) by mouth daily.   Indication:  High Blood Pressure     canagliflozin 100 MG Tabs tablet  Commonly known as:  INVOKANA  Take 1 tablet (100 mg total) by mouth daily.   Indication:  Type 2 Diabetes     Chlorphen-PE-Acetaminophen 4-10-325 MG Tabs  Commonly known as:  NOREL AD  1 PO bid PRN   Indication:  pain     DULoxetine 60 MG capsule  Commonly known as:  CYMBALTA  Take 1 capsule (60 mg total) by mouth 2 (two) times daily.   Indication:  Major Depressive Disorder     FERRALET 90 90-1 MG Tabs  Take 1 tab daily   Indication:  Anemia From Inadequate Iron in the Body     linagliptin 5 MG Tabs tablet  Commonly known as:  TRADJENTA  Take 1 tablet (5 mg total) by mouth daily.   Indication:  Type 2 Diabetes     nicotine 21  mg/24hr patch  Commonly known as:  NICODERM CQ - dosed in mg/24 hours  Place 1 patch (21 mg total) onto the skin daily.   Indication:  Nicotine Addiction     pantoprazole 40 MG tablet  Commonly known as:  PROTONIX  Take 1 tablet (40 mg total) by mouth daily.   Indication:  Gastroesophageal Reflux Disease     pregabalin 100 MG capsule  Commonly known as:  LYRICA  Take 1 capsule (100 mg total) by mouth 3 (three) times daily.   Indication:  Neuropathic Pain     roflumilast 500 MCG Tabs tablet  Commonly known as:  DALIRESP  Take 1 tablet (500 mcg total) by mouth daily.   Indication:  Chronic Obstructive Lung Disease       Follow-up Information    Follow up with Dr. Harrington Challenger - [facility] On 10/15/2014.   Why:  October 15, 2014 at 9 AM.  You will be place a cancellation list and will be called for an earlier appointment should one come available   Contact information:   [address] [place], [place]  [phone]        Follow up with [facility].   Why:  Patient will schedule with PCP   Contact information:   [place] [place], [place]   [address]  [phone]      Please follow up.   Why:  Per [employer], you are eligible for one month of free case management.  If you are interested in this service,  please contact Sophronia Simas at [phone] x [phone]       Follow up with Dr. Moses Manners [facility] On 10/08/2014.   Why:  Thursday, June 16 at 2:45 PM.  You will be placed on cancellation list for an ealier appointment   Contact information:   [address] [place], [place]   [address]  [phone]      Follow-up recommendations:  Activity:  as tol, diet as tol  Comments:  1.  Take all your medications as prescribed.              2.  Report any adverse side effects to outpatient provider.                       3.  Patient instructed to not use alcohol or illegal drugs while on prescription medicines.            4.  In  the event of worsening symptoms, instructed patient to call 911, the crisis hotline or go to nearest emergency room for evaluation of symptoms.  Total Discharge Time:  40 min  Signed: Freda Munro May Agustin AGNP-BC 09/19/2014, 9:51 AM    Patient seen, Suicide Assessment Completed.  Disposition Plan Reviewed

## 2014-09-18 NOTE — Progress Notes (Signed)
Recreation Therapy Notes  Date: 05.27.16 Time: 9:30am Location: 300 Group Room  Group Topic: Stress Management  Goal Area(s) Addresses:  Patient will verbalize importance of using healthy stress management.  Patient will identify positive emotions associated with healthy stress management.   Intervention: Progressive Muscle Relaxation, Guided Imagery Script  Activity :  Progressive Muscle Relaxation & Guided Imagery.  LRT introduced and educated patients on stress management  Techniques of progressive muscle relaxation and guided imagery.  Scripts were used to deliver both techniques to patients, patients were asked to follow the script read allowed by LRT to engage in practicing both stress management techniques.  Education:  Stress Management, Discharge Planning.   Education Outcome: Acknowledges edcuation/In group clarification offered/Needs additional education  Clinical Observations/Feedback: Patient did not attend group.  Mally Gavina Lindasy, LRT/CTRS         Victorino Sparrow A 09/18/2014 4:12 PM

## 2014-09-22 ENCOUNTER — Encounter (HOSPITAL_COMMUNITY): Payer: Self-pay | Admitting: Psychiatry

## 2014-09-22 ENCOUNTER — Ambulatory Visit (INDEPENDENT_AMBULATORY_CARE_PROVIDER_SITE_OTHER): Payer: Medicare HMO | Admitting: Psychiatry

## 2014-09-22 VITALS — BP 100/63 | HR 115 | Ht 64.0 in | Wt 284.6 lb

## 2014-09-22 DIAGNOSIS — F411 Generalized anxiety disorder: Secondary | ICD-10-CM | POA: Diagnosis not present

## 2014-09-22 DIAGNOSIS — F331 Major depressive disorder, recurrent, moderate: Secondary | ICD-10-CM

## 2014-09-22 DIAGNOSIS — F332 Major depressive disorder, recurrent severe without psychotic features: Secondary | ICD-10-CM

## 2014-09-22 DIAGNOSIS — F1910 Other psychoactive substance abuse, uncomplicated: Secondary | ICD-10-CM

## 2014-09-22 MED ORDER — ARIPIPRAZOLE 2 MG PO TABS: 2.0000 mg | ORAL_TABLET | Freq: Every day | ORAL | Status: DC

## 2014-09-22 MED ORDER — DULOXETINE HCL 60 MG PO CPEP: 60.0000 mg | ORAL_CAPSULE | Freq: Two times a day (BID) | ORAL | Status: DC

## 2014-09-22 MED ORDER — TRAZODONE HCL 50 MG PO TABS: 50.0000 mg | ORAL_TABLET | Freq: Every day | ORAL | Status: DC
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
## 2014-09-23 ENCOUNTER — Encounter: Payer: Self-pay | Admitting: Gastroenterology

## 2014-09-23 ENCOUNTER — Ambulatory Visit (INDEPENDENT_AMBULATORY_CARE_PROVIDER_SITE_OTHER): Payer: Medicare HMO | Admitting: Gastroenterology

## 2014-09-23 ENCOUNTER — Other Ambulatory Visit: Payer: Self-pay

## 2014-09-23 VITALS — BP 121/75 | HR 72 | Temp 97.0°F | Ht 64.0 in | Wt 285.4 lb

## 2014-09-23 DIAGNOSIS — R1013 Epigastric pain: Secondary | ICD-10-CM | POA: Diagnosis not present

## 2014-09-23 DIAGNOSIS — R109 Unspecified abdominal pain: Secondary | ICD-10-CM

## 2014-09-23 DIAGNOSIS — D649 Anemia, unspecified: Secondary | ICD-10-CM

## 2014-09-23 NOTE — Progress Notes (Signed)
Subjective:    Patient ID: [name], female    DOB: 1965/09/13, 49 y.o.   MRN: [id number]  Vic Blackbird, MD  HPI ABD PAIN WORSE WITH METFORMIN AND ORAL IRON. HAVING A MENSTRUAL CYCLE EVERY 2-3 MOS: 2-3 DAYS LIGHT BLEEDING SINCE HAVING ABLATION:2014. BLACK STOOL SINCE TAKING DEC 2015. NAUSEA:  3X/WEEK. MAY EAT CRACKERS. NO HEARTBURN REALLY. VOMITING: NEVER. DRY HEAVES: MAY BE COUPLE TIMES A DAY IF SHE GETS TO COUGHING. SMOKING: 1 PACK/DAY & LATELY 1.5 PK A DAY DUE TO FEELING STRESSED. BMs: YELLOW MOSTLY. CONSTIPATED: HARD PELLET LIKE STOOLS(3X/WEEK) ASSOCIATED WITH BLOATING. RARE-LOOSE STOOLS(4X/WEEK). FEELS GASSY A LOT. HAD MID CHEST PAIN: ACHY/HEAVY(4-5 X/WEEK). SOB: DUE TO COPD. ABD PAIN: BELLY BUTTON UP, PRESSURE/ACHY. KEEPS HER AWAKE SOMETIMES(1X/WEEK). STRESS: LIVING SITUATION, BEING IN PAIN, ATTEMPTED SUICIDE MAY 2016. USES IBUPROFEN DAILY FOR BACK/LEG PAIN. THC: COUPLE TIMES A WEEK DEPENDING ON STRESS.  PT DENIES FEVER, CHILLS, HEMATOCHEZIA, HEMATEMESIS, vomiting, melena, problems swallowing, OR heartburn.  Past Medical History  Diagnosis Date  . Diabetes mellitus   . Asthmatic bronchitis     normal PFT/ seen by pulmonary no evidence of COPD  . HTN (hypertension)   . Low back pain   . Tachycardia     never had test done since no insurance  . Depression   . Shortness of breath   . Anxiety   . Gastric erosions     EGD 08/2010.  . Internal hemorrhoids     Colonoscopy 5/12.  [name] Heavy menses   . Chronic respiratory failure with hypoxia     On 2-3 L of oxygen at home  . GERD (gastroesophageal reflux disease)   . Anemia   . Arthritis   . Sleep apnea   . COPD (chronic obstructive pulmonary disease)    Past Surgical History  Procedure Laterality Date  . Cholecystectomy  1990  . Cesarean section      twice  . Kidney surgery      as child for blockages  . Tubal ligation    . Tonsillectomy    . Wrist surgery  1995    Lt wrist  . Tympanostomy tube placement      . Hysteroscopy with thermachoice  01/17/2012    Procedure: HYSTEROSCOPY WITH THERMACHOICE;  Surgeon: Florian Buff, MD;  Location: [facility];  Service: Gynecology;  Laterality: N/A;  total therapy time: 9:13sec  D5W  18 ml in, D5W   40ml out, temperture 87degrees celcious  . Uterine ablation    . Colonoscopy  09/16/2010    AXE:NMMHWK colon/small internal hemorrhoids  . Esophagogastroduodenoscopy  09/16/2010    SLF: normal/mild gastritis   Allergies  Allergen Reactions  . Codeine Itching and Nausea Only  . Metformin And Related Diarrhea  . Wellbutrin [Bupropion Hcl] Hives   Current Outpatient Prescriptions  Medication Sig Dispense Refill  . albuterol (PROVENTIL HFA) 108 (90 BASE) MCG/ACT inhaler Inhale 2 puffs into the lungs every 4 (four) hours as needed for wheezing or shortness of breath.    [name] albuterol (PROVENTIL) (2.5 MG/3ML) 0.083% nebulizer solution Take 3 mLs (2.5 mg total) by nebulization every 4 (four) hours as needed for wheezing.    . ARIPiprazole (ABILIFY) 2 MG tablet Take 1 tablet (2 mg total) by mouth daily.    [name] atorvastatin (LIPITOR) 10 MG tablet Take 1 tablet (10 mg total) by mouth at bedtime.    . bisoprolol (ZEBETA) 10 MG tablet Take 1 tablet (10 mg total) by mouth daily.    [name]  canagliflozin (INVOKANA) 100 MG TABS tablet Take 1 tablet (100 mg total) by mouth daily.    . DULoxetine (CYMBALTA) 60 MG capsule Take 1 capsule (60 mg total) by mouth 2 (two) times daily.    . pantoprazole (PROTONIX) 40 MG tablet Take 1 tablet (40 mg total) by mouth daily.    . pregabalin (LYRICA) 100 MG capsule Take 1 capsule (100 mg total) by mouth 3 (three) times daily.    . roflumilast (DALIRESP) 500 MCG TABS tablet Take 1 tablet (500 mcg total) by mouth daily.    . SitaGLIPtin Phosphate (JANUVIA PO) Take 100 mg by mouth daily.    . traZODone (DESYREL) 50 MG tablet Take 1 tablet (50 mg total) by mouth at bedtime.     Family History  Problem Relation Age of Onset  . Heart attack Father 56     deceased, etoh use  . Heart disease Father   . Alcohol abuse Father   . Depression Father   . Heart attack Mother 86    deceased  . Diabetes Mother   . Breast cancer Mother   . Heart failure Mother     oxygen dependence, nonsmoker  . Heart disease Mother   . Depression Mother   . Cancer Mother   . Colon cancer Neg Hx   . Liver disease Maternal Aunt 62    died while on liver transplant list  . Heart attack Maternal Grandmother     premature CAD  . Ulcers Sister   . Hypertension Sister     History  Substance Use Topics  . Smoking status: Current Every Day Smoker -- 0.50 packs/day for 30 years    Types: Cigarettes  . Smokeless tobacco: Never Used     Comment: using the vapor cigarettes  . Alcohol Use: 0.0 oz/week    0 Standard drinks or equivalent per week     Comment: social use   Review of Systems PER HPI OTHERWISE ALL SYSTEMS ARE NEGATIVE.    Objective:   Physical Exam  Constitutional: She is oriented to person, place, and time. She appears well-developed and well-nourished. No distress.  HENT:  Head: Normocephalic and atraumatic.  Mouth/Throat: Oropharynx is clear and moist. No oropharyngeal exudate.  Eyes: Pupils are equal, round, and reactive to light. No scleral icterus.  Neck: Normal range of motion. Neck supple.  Cardiovascular: Normal rate, regular rhythm and normal heart sounds.   Pulmonary/Chest: Effort normal and breath sounds normal. No respiratory distress.  Abdominal: Soft. Bowel sounds are normal. She exhibits no distension. There is tenderness. There is no rebound and no guarding.  MODERATE TTP IN RUQ/RLQ, MILD BLQs TTP    Musculoskeletal: She exhibits no edema.  Lymphadenopathy:    She has no cervical adenopathy.  Neurological: She is alert and oriented to person, place, and time.  NO FOCAL DEFICITS   Psychiatric:  FLAT AFFECT, SLIGHTLY DEPRESSED MOOD   Vitals reviewed.         Assessment & Plan:

## 2014-09-23 NOTE — Assessment & Plan Note (Signed)
ASSOCIATED WITH INTERMITTENT CONSTIPATION/DIARRHEA, ANEMIA, AND NAUSEA/BLOATING. MOST LIKELY DUE TO IBS-MIXED, LESS LIKELY GASTRIC, ESOPHAGEAL OR PANCREATIC CANCER, SMALL BOWEL TUMOR/ULCER.  CT ABD/PELVIS W/ IVC TO EVALUATE FOR OCCULT MALIGNANCY EGD TO EVALUATE FOR H PYLORI GASTRITIS OR GE JUNCTION TUMOR PROTONIX BID PEPCID PRN TAKE IBGARD 2 DAILY WITH 8 OZ OF WATER. REFER TO HEMATOLOGY FOR NORMOCYTIC ANEMIA. CONSIDER IVFE. STOP TAKING ORAL IRON DUE TO GI UPSET FOLLOW UP IN 4 MOS.

## 2014-09-23 NOTE — Patient Instructions (Signed)
COMPLETE CT SCAN & UPPER ENDOSCOPY TO EVALUATE YOUR ABDOMINAL PAIN AND FOR H PYLORI GASTRITIS  CONTINUE PROTONIX. TAKE 30 MINUTES PRIOR TO MEALS TWICE DAILY.  PEPCID HELPS MOST WHEN USED AS NEEDED.  TAKE IBGARD 2 DAILY WITH 8 OZ OF WATER.  REFER TO HEMATOLOGY FOR ANEMIA & CONSIDER IV IRON.  STOP TAKING ORAL IRON DUE TO GI UPSET.  FOLLOW UP IN 4 MOS.

## 2014-09-29 ENCOUNTER — Ambulatory Visit (HOSPITAL_COMMUNITY)
Admission: RE | Admit: 2014-09-29 | Discharge: 2014-09-29 | Disposition: A | Discharge status: Home / Self Care | Payer: Medicare HMO | Source: Ambulatory Visit | Attending: Gastroenterology | Admitting: Gastroenterology

## 2014-09-29 DIAGNOSIS — R197 Diarrhea, unspecified: Secondary | ICD-10-CM | POA: Insufficient documentation

## 2014-09-29 DIAGNOSIS — R1084 Generalized abdominal pain: Secondary | ICD-10-CM | POA: Diagnosis present

## 2014-09-29 DIAGNOSIS — R110 Nausea: Secondary | ICD-10-CM | POA: Insufficient documentation

## 2014-09-29 DIAGNOSIS — D649 Anemia, unspecified: Secondary | ICD-10-CM

## 2014-09-29 DIAGNOSIS — Z9049 Acquired absence of other specified parts of digestive tract: Secondary | ICD-10-CM | POA: Insufficient documentation

## 2014-09-29 DIAGNOSIS — M879 Osteonecrosis, unspecified: Secondary | ICD-10-CM | POA: Insufficient documentation

## 2014-09-29 DIAGNOSIS — R109 Unspecified abdominal pain: Secondary | ICD-10-CM

## 2014-09-29 DIAGNOSIS — K5900 Constipation, unspecified: Secondary | ICD-10-CM | POA: Diagnosis not present

## 2014-09-29 MED ORDER — IOHEXOL 300 MG/ML  SOLN
100.0000 mL | Freq: Once | INTRAMUSCULAR | Status: AC | PRN
  Administered 2014-09-29: 100 mL via INTRAVENOUS

## 2014-10-01 ENCOUNTER — Encounter: Payer: Self-pay | Admitting: Gastroenterology

## 2014-10-02 ENCOUNTER — Encounter (HOSPITAL_COMMUNITY): Payer: Self-pay | Admitting: Hematology & Oncology

## 2014-10-02 ENCOUNTER — Encounter (HOSPITAL_COMMUNITY): Payer: Medicare HMO | Attending: Hematology & Oncology | Admitting: Hematology & Oncology

## 2014-10-02 VITALS — BP 118/61 | HR 78 | Temp 98.5°F | Resp 18 | Ht 64.75 in | Wt 281.1 lb

## 2014-10-02 DIAGNOSIS — Z720 Tobacco use: Secondary | ICD-10-CM

## 2014-10-02 DIAGNOSIS — D6489 Other specified anemias: Secondary | ICD-10-CM | POA: Diagnosis not present

## 2014-10-02 DIAGNOSIS — E538 Deficiency of other specified B group vitamins: Secondary | ICD-10-CM

## 2014-10-02 DIAGNOSIS — F329 Major depressive disorder, single episode, unspecified: Secondary | ICD-10-CM

## 2014-10-02 DIAGNOSIS — D509 Iron deficiency anemia, unspecified: Secondary | ICD-10-CM | POA: Diagnosis not present

## 2014-10-02 LAB — COMPREHENSIVE METABOLIC PANEL
ALT: 11 U/L — ABNORMAL LOW (ref 14–54)
AST: 16 U/L (ref 15–41)
Albumin: 3.7 g/dL (ref 3.5–5.0)
Alkaline Phosphatase: 54 U/L (ref 38–126)
Anion gap: 11 (ref 5–15)
BUN: 10 mg/dL (ref 6–20)
CO2: 27 mmol/L (ref 22–32)
Calcium: 9.2 mg/dL (ref 8.9–10.3)
Chloride: 102 mmol/L (ref 101–111)
Creatinine, Ser: 0.78 mg/dL (ref 0.44–1.00)
GFR calc Af Amer: >60 mL/min (ref >60)
GFR calc non Af Amer: >60 mL/min (ref >60)
Glucose, Bld: 121 mg/dL — ABNORMAL HIGH (ref 65–99)
Potassium: 3.6 mmol/L (ref 3.5–5.1)
Sodium: 140 mmol/L (ref 135–145)
Total Bilirubin: 0.4 mg/dL (ref 0.3–1.2)
Total Protein: 7.3 g/dL (ref 6.5–8.1)

## 2014-10-02 LAB — CBC WITH DIFFERENTIAL/PLATELET
Basophils Absolute: 0 10*3/uL (ref 0.0–0.1)
Basophils Relative: 0 % (ref 0–1)
Eosinophils Absolute: 0.1 10*3/uL (ref 0.0–0.7)
Eosinophils Relative: 1 % (ref 0–5)
HCT: 37.8 % (ref 36.0–46.0)
Hemoglobin: 10.8 g/dL — ABNORMAL LOW (ref 12.0–15.0)
Lymphocytes Relative: 25 % (ref 12–46)
Lymphs Abs: 3.1 10*3/uL (ref 0.7–4.0)
MCH: 21 pg — ABNORMAL LOW (ref 26.0–34.0)
MCHC: 28.6 g/dL — ABNORMAL LOW (ref 30.0–36.0)
MCV: 73.5 fL — ABNORMAL LOW (ref 78.0–100.0)
Monocytes Absolute: 0.8 10*3/uL (ref 0.1–1.0)
Monocytes Relative: 6 % (ref 3–12)
Neutro Abs: 8.7 10*3/uL — ABNORMAL HIGH (ref 1.7–7.7)
Neutrophils Relative %: 68 % (ref 43–77)
Platelets: 311 10*3/uL (ref 150–400)
RBC: 5.14 MIL/uL — ABNORMAL HIGH (ref 3.87–5.11)
RDW: 19.1 % — ABNORMAL HIGH (ref 11.5–15.5)
WBC: 12.8 10*3/uL — ABNORMAL HIGH (ref 4.0–10.5)

## 2014-10-02 LAB — RETICULOCYTES
RBC.: 5.14 MIL/uL — ABNORMAL HIGH (ref 3.87–5.11)
Retic Count, Absolute: 113.1 10*3/uL (ref 19.0–186.0)
Retic Ct Pct: 2.2 % (ref 0.4–3.1)

## 2014-10-02 LAB — FERRITIN: Ferritin: 19 ng/mL (ref 11–307)

## 2014-10-02 LAB — IRON AND TIBC
Iron: 18 ug/dL — ABNORMAL LOW (ref 28–170)
Saturation Ratios: 4 % — ABNORMAL LOW (ref 10.4–31.8)
TIBC: 412 ug/dL (ref 250–450)
UIBC: 394 ug/dL

## 2014-10-02 LAB — VITAMIN B12: Vitamin B-12: 213 pg/mL (ref 180–914)

## 2014-10-02 LAB — SEDIMENTATION RATE: Sed Rate: 20 mm/hr (ref 0–22)

## 2014-10-02 LAB — LACTATE DEHYDROGENASE: LDH: 184 U/L (ref 98–192)

## 2014-10-02 LAB — FOLATE: Folate: 7 ng/mL (ref >5.9)

## 2014-10-02 NOTE — Progress Notes (Signed)
[facility] NOTE  Patient Care Team: Alycia Rossetti, MD as PCP - General (Family Medicine) Danie Binder, MD (Gastroenterology)  CHIEF COMPLAINTS/PURPOSE OF CONSULTATION:  Anemia Intolerance to oral iron  HISTORY OF PRESENTING ILLNESS:  [name] 49 y.o. female is here because of iron deficiency anemia. She states she craves ice but cannot eat it as "it hurts her teeth." She had a colonoscopy with Dr. Oneida Alar back in 2012 with random biopsies secondary to diarrhea. All biopsies were normal.  She just underwent an EGD on 09/23/2014, results are not available but the patient reports it was "normal."  She cannot tolerate oral iron because it causes her stomach pain.   She says her anxiety has been high and she has been really depressed. She feels like shes a 'burden' on loved ones, crys for no reason, and does not have a car.  Attempted suicide a couple of weeks ago by taking 50 Xanax at once.  Only thing remembered is waking up in jail from which she was then taken to hospital where tried to cut wrist with "EC[employer] stickers".  She has a history of suicide attempts in the past. She has a history of drug abuse.  Laboratory studies dating back to December of last year showed intermittent iron deficiency, microcytic anemia with a hemoglobin as low as 9.5. Normal W11 and folic acid was low at 3.3 ng/ml in December.  MEDICAL HISTORY:  Past Medical History  Diagnosis Date  . Diabetes mellitus   . Asthmatic bronchitis     normal PFT/ seen by pulmonary no evidence of COPD  . HTN (hypertension)   . Low back pain   . Tachycardia     never had test done since no insurance  . Depression   . Shortness of breath   . Anxiety   . Gastric erosions     EGD 08/2010.  . Internal hemorrhoids     Colonoscopy 5/12.  [name] Heavy menses   . Chronic respiratory failure with hypoxia     On 2-3 L of oxygen at home  . GERD (gastroesophageal reflux disease)   . Anemia     . Arthritis   . Sleep apnea   . COPD (chronic obstructive pulmonary disease)     SURGICAL HISTORY: Past Surgical History  Procedure Laterality Date  . Cholecystectomy  1990  . Cesarean section      twice  . Kidney surgery      as child for blockages  . Tubal ligation    . Tonsillectomy    . Wrist surgery  1995    Lt wrist  . Tympanostomy tube placement    . Hysteroscopy with thermachoice  01/17/2012    Procedure: HYSTEROSCOPY WITH THERMACHOICE;  Surgeon: Florian Buff, MD;  Location: [facility];  Service: Gynecology;  Laterality: N/A;  total therapy time: 9:13sec  D5W  18 ml in, D5W   72ml out, temperture 87degrees celcious  . Uterine ablation    . Colonoscopy  09/16/2010    BJY:NWGNFA colon/small internal hemorrhoids  . Esophagogastroduodenoscopy  09/16/2010    SLF: normal/mild gastritis    SOCIAL HISTORY: History   Social History  . Marital Status: Single    Spouse Name: N/A  . Number of Children: 2  . Years of Education: N/A   Occupational History  . unemployed    Social History Main Topics  . Smoking status: Current Every Day Smoker -- 0.50 packs/day for  30 years    Types: Cigarettes  . Smokeless tobacco: Never Used     Comment: using the vapor cigarettes  . Alcohol Use: 0.0 oz/week    0 Standard drinks or equivalent per week     Comment: social use  . Drug Use: Yes    Special: Marijuana  . Sexual Activity: No   Other Topics Concern  . Not on file   Social History Narrative  Single, never been married 2 children, 1 grandchild Smokes 1ppd, since age 42 ETOH, normal On disability since 2010-08-19 Both parents deceased 1 sister Lives with her sister   FAMILY HISTORY: Family History  Problem Relation Age of Onset  . Heart attack Father 67    deceased, etoh use  . Heart disease Father   . Alcohol abuse Father   . Depression Father   . Heart attack Mother 77    deceased  . Diabetes Mother   . Breast cancer Mother   . Heart failure Mother     oxygen  dependence, nonsmoker  . Heart disease Mother   . Depression Mother   . Cancer Mother   . Colon cancer Neg Hx   . Liver disease Maternal Aunt 80    died while on liver transplant list  . Heart attack Maternal Grandmother     premature CAD  . Ulcers Sister   . Hypertension Sister    has no family status information on file.   Mother died at 4, heart attack, congested heart failure Father died 46, heart attack 1 sister, has similar problems but no insurance so no regular doctor to see  ALLERGIES:  is allergic to codeine; metformin and related; and wellbutrin.  MEDICATIONS:  Current Outpatient Prescriptions  Medication Sig Dispense Refill  . albuterol (PROVENTIL HFA) 108 (90 BASE) MCG/ACT inhaler Inhale 2 puffs into the lungs every 4 (four) hours as needed for wheezing or shortness of breath. 1 Inhaler 6  . albuterol (PROVENTIL) (2.5 MG/3ML) 0.083% nebulizer solution Take 3 mLs (2.5 mg total) by nebulization every 4 (four) hours as needed for wheezing. 120 mL 12  . ARIPiprazole (ABILIFY) 2 MG tablet Take 1 tablet (2 mg total) by mouth daily. 30 tablet 2  . atorvastatin (LIPITOR) 10 MG tablet Take 1 tablet (10 mg total) by mouth at bedtime. 30 tablet 6  . bisoprolol (ZEBETA) 10 MG tablet Take 1 tablet (10 mg total) by mouth daily. 30 tablet 5  . canagliflozin (INVOKANA) 100 MG TABS tablet Take 1 tablet (100 mg total) by mouth daily. 30 tablet 3  . DULoxetine (CYMBALTA) 60 MG capsule Take 1 capsule (60 mg total) by mouth 2 (two) times daily. 60 capsule 2  . JANUVIA 100 MG tablet Take 100 mg by mouth daily.  0  . pantoprazole (PROTONIX) 40 MG tablet Take 1 tablet (40 mg total) by mouth daily. (Patient taking differently: Take 40 mg by mouth 2 (two) times daily. ) 30 tablet 0  . pregabalin (LYRICA) 100 MG capsule Take 1 capsule (100 mg total) by mouth 3 (three) times daily. 90 capsule 0  . roflumilast (DALIRESP) 500 MCG TABS tablet Take 1 tablet (500 mcg total) by mouth daily. 30 tablet 0    . traZODone (DESYREL) 50 MG tablet Take 1 tablet (50 mg total) by mouth at bedtime. 30 tablet 2  . famotidine (PEPCID) 20 MG tablet Take 20 mg by mouth at bedtime.    [name] ibuprofen (ADVIL,MOTRIN) 200 MG tablet Take 600 mg by mouth every  6 (six) hours as needed for moderate pain.    . naproxen sodium (ANAPROX) 220 MG tablet Take 440 mg by mouth at bedtime as needed (pain).     No current facility-administered medications for this visit.    Review of Systems  Constitutional: Negative for fever, chills, weight loss and malaise/fatigue.  HENT: Negative for congestion, hearing loss, nosebleeds, sore throat and tinnitus.        Often, more than usual  Eyes: Positive for blurred vision. Negative for double vision, pain and discharge.       Wears glasses but prescription no longer works  Respiratory: Positive for wheezing. Negative for cough, hemoptysis, sputum production and shortness of breath.        Wheezing in lungs  Cardiovascular: Negative for chest pain, palpitations, claudication, leg swelling and PND.  Gastrointestinal: Positive for diarrhea. Negative for heartburn, nausea, vomiting, abdominal pain, constipation, blood in stool and melena.       On medication  Genitourinary: Positive for frequency. Negative for dysuria, urgency and hematuria.       Every hour at night  Musculoskeletal: Positive for back pain and joint pain. Negative for myalgias and falls.  Skin: Negative for itching and rash.  Neurological: Positive for headaches. Negative for dizziness, tingling, tremors, sensory change, speech change, focal weakness, seizures, loss of consciousness and weakness.  Endo/Heme/Allergies: Does not bruise/bleed easily.  Psychiatric/Behavioral: Positive for depression and suicidal ideas. Negative for memory loss and substance abuse. The patient is nervous/anxious. The patient does not have insomnia.        On medication for depression Attempted suicide a couple of weeks ago Note she is not  CURRENTLY suicidal. Denies a plan.    14 point ROS was done and is otherwise as detailed above or in HPI   PHYSICAL EXAMINATION: ECOG PERFORMANCE STATUS: 1 - Symptomatic but completely ambulatory  Filed Vitals:   10/02/14 1524  BP: 118/61  Pulse: 78  Temp: 98.5 F (36.9 C)  Resp: 18   Filed Weights   10/02/14 1524  Weight: 281 lb 1.6 oz (127.506 kg)     Physical Exam  Constitutional: She is oriented to person, place, and time and well-developed, well-nourished, and in no distress.  HENT:  Head: Normocephalic and atraumatic.  Nose: Nose normal.  Mouth/Throat: Oropharynx is clear and moist. No oropharyngeal exudate.  Eyes: Conjunctivae and EOM are normal. Pupils are equal, round, and reactive to light. Right eye exhibits no discharge. Left eye exhibits no discharge. No scleral icterus.  Neck: Normal range of motion. Neck supple. No tracheal deviation present. No thyromegaly present.  Cardiovascular: Normal rate, regular rhythm, normal heart sounds and intact distal pulses.  Exam reveals no gallop and no friction rub.   No murmur heard. Pulmonary/Chest: Effort normal. She has wheezes. She has no rales.  Slight wheezing in lungs  Abdominal: Soft. Bowel sounds are normal. She exhibits no distension and no mass. There is tenderness. There is no rebound and no guarding.  Upper abdomen tenderness  Musculoskeletal: Normal range of motion. She exhibits no edema.  Lymphadenopathy:    She has no cervical adenopathy.  Neurological: She is alert and oriented to person, place, and time. She has normal reflexes. No cranial nerve deficit. Gait normal. Coordination normal.  Skin: Skin is warm and dry. No rash noted.  Psychiatric: Mood, memory, affect and judgment normal.  Nursing note and vitals reviewed.   LABORATORY DATA:  I have reviewed the data as listed Lab Results  Component Value  Date   WBC 12.8* 10/02/2014   HGB 10.8* 10/02/2014   HCT 37.8 10/02/2014   MCV 73.5* 10/02/2014     PLT 311 10/02/2014   Results for AREAL, COCHRANE A (MRN [id number]) as of 10/11/2014 15:37  Ref. Range 03/26/2014 04:58  Iron Latest Ref Range: 42-135 ug/dL 15 (L)  UIBC Latest Ref Range: 125-400 ug/dL 304  TIBC Latest Ref Range: 250-470 ug/dL 319  Saturation Ratios Latest Ref Range: 20-55 % 5 (L)  Ferritin Latest Ref Range: 10-291 ng/mL 70  Folate Latest Units: ng/mL 3.3 (L)  Vitamin B-12 Latest Ref Range: 211-911 pg/mL 379  WBC Latest Ref Range: 4.0-10.5 K/uL 12.7 (H)  RBC Latest Ref Range: 3.87-5.11 MIL/uL 4.66  Hemoglobin Latest Ref Range: 12.0-15.0 g/dL 9.6 (L)  HCT Latest Ref Range: 36.0-46.0 % 32.1 (L)  MCV Latest Ref Range: 78.0-100.0 fL 68.9 (L)  MCH Latest Ref Range: 26.0-34.0 pg 20.6 (L)  MCHC Latest Ref Range: 30.0-36.0 g/dL 29.9 (L)  RDW Latest Ref Range: 11.5-15.5 % 19.1 (H)  Platelets Latest Ref Range: 150-400 K/uL 285  RBC. Latest Ref Range: 3.87-5.11 MIL/uL 4.66  Retic Ct Pct Latest Ref Range: 0.4-3.1 % 1.8  Retic Count, Manual Latest Ref Range: 19.0-186.0 K/uL 83.9    ASSESSMENT & PLAN:  Iron deficiency Anemia Intolerance to oral iron Uterine Ablation Normal C-scope Folic acid deficiency   I reviewed potential causes of iron deficiency anemia. The patient has had a uterine ablation. She is intolerant of oral iron and we discussed risks and benefits of IV iron replacement. She notes if it improves her energy level she would like to proceed with IV iron. I also recommended repeating several other anemia laboratory studies including a folic acid given her low folic acid in the past. I would like to recheck a B12 level and she was borderline low as well. We will plan on replacing her iron IV pending her iron levels and in addition making additional recommendations based upon the remainder of her laboratory studies. I will see her back in several weeks at which time we will review all the results.  The most likely cause of her anemia is due to chronic blood  loss/malabsorption syndrome. We discussed some of the risks, benefits, and alternatives of intravenous iron infusions. The patient is symptomatic from anemia and the iron level is critically low. She tolerated oral iron supplement poorly and desires to achieved higher levels of iron faster for adequate hematopoesis. Some of the side-effects to be expected including risks of infusion reactions, phlebitis, headaches, nausea and fatigue.  The patient is willing to proceed. Patient education material was dispensed.   Goal is to keep ferritin level greater than or equal to 100 ng/ml  She has significant concerns about transportation issues. I advised her I would work with our Education officer, museum to see what local transportation resources may be available.   Orders Placed This Encounter  Procedures  . CBC with Differential  . Comprehensive metabolic panel  . Iron and TIBC  . Ferritin  . Vitamin B12  . Reticulocytes  . Folate  . Lactate dehydrogenase  . Sedimentation rate  . Immunofixation electrophoresis  . Protein electrophoresis, serum  . Kappa/lambda light chains  . Haptoglobin   All questions were answered. The patient knows to call the clinic with any problems, questions or concerns.   This document serves as a record of services personally performed by Ancil Linsey, MD. It was created on her behalf by Janace Hoard, a trained medical  scribe. The creation of this record is based on the scribe's personal observations and the provider's statements to them. This document has been checked and approved by the attending provider.  I have reviewed the above documentation for accuracy and completeness, and I agree with the above.  This note was electronically signed.    Kelby Fam. Penland, MD 10/11/2014 3:46 PM

## 2014-10-02 NOTE — Patient Instructions (Signed)
[facility] Discharge Instructions  RECOMMENDATIONS MADE BY THE CONSULTANT AND ANY TEST RESULTS WILL BE SENT TO YOUR REFERRING PHYSICIAN.  Exam and discussion by Dr. Whitney Muse. Will check some labs today.  If any thing requires immediate action we will contact you.  Follow-up in 2 months.  Thank you for choosing [facility] at [facility] to provide your oncology and hematology care.  To afford each patient quality time with our provider, please arrive at least 15 minutes before your scheduled appointment time.    You need to re-schedule your appointment should you arrive 10 or more minutes late.  We strive to give you quality time with our providers, and arriving late affects you and other patients whose appointments are after yours.  Also, if you no show three or more times for appointments you may be dismissed from the clinic at the providers discretion.     Again, thank you for choosing [facility].  Our hope is that these requests will decrease the amount of time that you wait before being seen by our physicians.       _____________________________________________________________  Should you have questions after your visit to [facility], please contact our office at [phone] between the hours of 8:30 a.m. and 4:30 p.m.  Voicemails left after 4:30 p.m. will not be returned until the following business day.  For prescription refill requests, have your pharmacy contact our office.

## 2014-10-03 LAB — HAPTOGLOBIN: Haptoglobin: 255 mg/dL — ABNORMAL HIGH (ref 34–200)

## 2014-10-04 LAB — KAPPA/LAMBDA LIGHT CHAINS
Kappa free light chain: 28.19 mg/L — ABNORMAL HIGH (ref 3.30–19.40)
Kappa, lambda light chain ratio: 0.83 (ref 0.26–1.65)
Lambda free light chains: 33.95 mg/L — ABNORMAL HIGH (ref 5.71–26.30)

## 2014-10-05 LAB — PROTEIN ELECTROPHORESIS, SERUM
A/G Ratio: 1 (ref 0.7–2.0)
Albumin ELP: 3.3 g/dL (ref 2.9–4.4)
Alpha-1-Globulin: 0.3 g/dL (ref 0.1–0.4)
Alpha-2-Globulin: 1 g/dL (ref 0.4–1.2)
Beta Globulin: 1.3 g/dL (ref 0.6–1.3)
Gamma Globulin: 0.8 g/dL (ref 0.5–1.6)
Globulin, Total: 3.4 g/dL (ref 2.0–4.5)
Total Protein ELP: 6.7 g/dL (ref 6.0–8.5)

## 2014-10-05 LAB — IMMUNOFIXATION ELECTROPHORESIS
IgA: 381 mg/dL — ABNORMAL HIGH (ref 87–352)
IgG (Immunoglobin G), Serum: 882 mg/dL (ref 700–1600)
IgM, Serum: 67 mg/dL (ref 26–217)
Total Protein ELP: 6.7 g/dL (ref 6.0–8.5)

## 2014-10-07 NOTE — Progress Notes (Signed)
cc'd to pcp 

## 2014-10-08 ENCOUNTER — Encounter (HOSPITAL_COMMUNITY): Payer: Self-pay | Admitting: Psychology

## 2014-10-08 ENCOUNTER — Ambulatory Visit (INDEPENDENT_AMBULATORY_CARE_PROVIDER_SITE_OTHER): Payer: Medicare HMO | Admitting: Psychology

## 2014-10-08 DIAGNOSIS — F331 Major depressive disorder, recurrent, moderate: Secondary | ICD-10-CM | POA: Diagnosis not present
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
## 2014-10-11 ENCOUNTER — Encounter (HOSPITAL_COMMUNITY): Payer: Self-pay | Admitting: Hematology & Oncology

## 2014-10-12 ENCOUNTER — Telehealth (HOSPITAL_COMMUNITY): Payer: Self-pay | Admitting: *Deleted

## 2014-10-12 ENCOUNTER — Telehealth (HOSPITAL_COMMUNITY): Payer: Self-pay | Admitting: Hematology & Oncology

## 2014-10-12 NOTE — Telephone Encounter (Signed)
Called pt and spoke with her niece Baxter Flattery and asked her to have pt call number back to resch appt for July 11th due to provider being out of office. Number provided and Baxter Flattery agreed

## 2014-10-12 NOTE — Telephone Encounter (Signed)
PC TO [employer] MCR [id number] SPK WITH DAWN. ?'D IF [id number] REQUIRE AUTH. PER DAWN IT DOES NOT. $45 COPAY THEN PAYS 100% COPAY APPLIES TO PTS $ 4950 OOP INS WILL ONLY PAY FOR 510 UNITS PER DAY. CALL REF# [id number]

## 2014-10-13 ENCOUNTER — Encounter: Payer: Self-pay | Admitting: *Deleted

## 2014-10-13 ENCOUNTER — Other Ambulatory Visit: Payer: Self-pay | Admitting: Family Medicine

## 2014-10-13 DIAGNOSIS — D6489 Other specified anemias: Secondary | ICD-10-CM

## 2014-10-13 MED ORDER — JANUVIA 100 MG PO TABS: 100.0000 mg | ORAL_TABLET | Freq: Every day | ORAL | Status: DC

## 2014-10-13 NOTE — Progress Notes (Signed)
[facility] Clinical Social Work  Clinical Social Work was referred by Futures trader for assessment of psychosocial needs due to transportation concerns.  Clinical Social Worker contacted patient at home to offer support and assess for needs. CSW discussed with patient possible transportation resources to assist patient in getting to her appointments. Pt educated on how [employer] works and how much it costs per ride. Pt plans to consider this for future transportation needs and appreciated the phone call. She denied other needs at this time. She is aware of how to contact CSW at [facility] if needs arise.   Clinical Social Work interventions: Resource education  Loren Racer, [place] Tuesdays 8:30-1pm Wednesdays 8:30-12pm  [phone]

## 2014-10-13 NOTE — Telephone Encounter (Signed)
Medication refilled per protocol. 

## 2014-10-15 ENCOUNTER — Ambulatory Visit (HOSPITAL_COMMUNITY): Payer: Self-pay | Admitting: Psychiatry

## 2014-10-15 ENCOUNTER — Encounter (HOSPITAL_BASED_OUTPATIENT_CLINIC_OR_DEPARTMENT_OTHER): Payer: Medicare HMO

## 2014-10-15 VITALS — BP 135/85 | HR 63 | Temp 98.4°F | Resp 18

## 2014-10-15 DIAGNOSIS — D509 Iron deficiency anemia, unspecified: Secondary | ICD-10-CM | POA: Diagnosis not present

## 2014-10-15 MED ORDER — SODIUM CHLORIDE 0.9 % IV SOLN
INTRAVENOUS | Status: DC
  Administered 2014-10-15: 15:00:00 via INTRAVENOUS

## 2014-10-15 MED ORDER — SODIUM CHLORIDE 0.9 % IV SOLN
510.0000 mg | Freq: Once | INTRAVENOUS | Status: AC
  Administered 2014-10-15: 510 mg via INTRAVENOUS
  Filled 2014-10-15: qty 17

## 2014-10-15 NOTE — Progress Notes (Signed)
Patient tolerated infusion well.  Monitored for 30 minutes post infusion without reaction, VSS.  Patient understands return appointment.

## 2014-10-19 ENCOUNTER — Encounter (HOSPITAL_COMMUNITY): Payer: Self-pay | Admitting: *Deleted

## 2014-10-19 ENCOUNTER — Encounter (HOSPITAL_COMMUNITY)
Admission: RE | Disposition: A | Discharge status: Home / Self Care | Payer: Self-pay | Source: Ambulatory Visit | Attending: Gastroenterology

## 2014-10-19 ENCOUNTER — Ambulatory Visit (HOSPITAL_COMMUNITY)
Admission: RE | Admit: 2014-10-19 | Discharge: 2014-10-19 | Disposition: A | Discharge status: Home / Self Care | Payer: Medicare HMO | Source: Ambulatory Visit | Attending: Gastroenterology | Admitting: Gastroenterology

## 2014-10-19 DIAGNOSIS — Z888 Allergy status to other drugs, medicaments and biological substances status: Secondary | ICD-10-CM | POA: Insufficient documentation

## 2014-10-19 DIAGNOSIS — F329 Major depressive disorder, single episode, unspecified: Secondary | ICD-10-CM | POA: Diagnosis not present

## 2014-10-19 DIAGNOSIS — F17210 Nicotine dependence, cigarettes, uncomplicated: Secondary | ICD-10-CM | POA: Insufficient documentation

## 2014-10-19 DIAGNOSIS — K5900 Constipation, unspecified: Secondary | ICD-10-CM | POA: Insufficient documentation

## 2014-10-19 DIAGNOSIS — F419 Anxiety disorder, unspecified: Secondary | ICD-10-CM | POA: Diagnosis not present

## 2014-10-19 DIAGNOSIS — R000 Tachycardia, unspecified: Secondary | ICD-10-CM | POA: Insufficient documentation

## 2014-10-19 DIAGNOSIS — R197 Diarrhea, unspecified: Secondary | ICD-10-CM | POA: Diagnosis not present

## 2014-10-19 DIAGNOSIS — K219 Gastro-esophageal reflux disease without esophagitis: Secondary | ICD-10-CM | POA: Diagnosis not present

## 2014-10-19 DIAGNOSIS — I10 Essential (primary) hypertension: Secondary | ICD-10-CM | POA: Insufficient documentation

## 2014-10-19 DIAGNOSIS — Z885 Allergy status to narcotic agent status: Secondary | ICD-10-CM | POA: Insufficient documentation

## 2014-10-19 DIAGNOSIS — J449 Chronic obstructive pulmonary disease, unspecified: Secondary | ICD-10-CM | POA: Insufficient documentation

## 2014-10-19 DIAGNOSIS — K2950 Unspecified chronic gastritis without bleeding: Secondary | ICD-10-CM | POA: Diagnosis not present

## 2014-10-19 DIAGNOSIS — Z9981 Dependence on supplemental oxygen: Secondary | ICD-10-CM | POA: Diagnosis not present

## 2014-10-19 DIAGNOSIS — R1013 Epigastric pain: Secondary | ICD-10-CM

## 2014-10-19 DIAGNOSIS — J45909 Unspecified asthma, uncomplicated: Secondary | ICD-10-CM | POA: Insufficient documentation

## 2014-10-19 DIAGNOSIS — E119 Type 2 diabetes mellitus without complications: Secondary | ICD-10-CM | POA: Insufficient documentation

## 2014-10-19 DIAGNOSIS — J9611 Chronic respiratory failure with hypoxia: Secondary | ICD-10-CM | POA: Diagnosis not present

## 2014-10-19 DIAGNOSIS — M1990 Unspecified osteoarthritis, unspecified site: Secondary | ICD-10-CM | POA: Diagnosis not present

## 2014-10-19 DIAGNOSIS — G4730 Sleep apnea, unspecified: Secondary | ICD-10-CM | POA: Diagnosis not present

## 2014-10-19 DIAGNOSIS — R079 Chest pain, unspecified: Secondary | ICD-10-CM | POA: Diagnosis not present

## 2014-10-19 HISTORY — DX: Pure hypercholesterolemia, unspecified: E78.00

## 2014-10-19 HISTORY — PX: ESOPHAGOGASTRODUODENOSCOPY: SHX5428

## 2014-10-19 LAB — GLUCOSE, CAPILLARY: Glucose-Capillary: 162 mg/dL — ABNORMAL HIGH (ref 65–99)

## 2014-10-19 SURGERY — EGD (ESOPHAGOGASTRODUODENOSCOPY)
Anesthesia: Moderate Sedation

## 2014-10-19 MED ORDER — MIDAZOLAM HCL 5 MG/5ML IJ SOLN
INTRAMUSCULAR | Status: DC | PRN
  Administered 2014-10-19 (×2): 2 mg via INTRAVENOUS
  Administered 2014-10-19: 1 mg via INTRAVENOUS

## 2014-10-19 MED ORDER — SIMETHICONE 40 MG/0.6ML PO SUSP
ORAL | Status: DC | PRN
  Administered 2014-10-19: 09:00:00

## 2014-10-19 MED ORDER — SODIUM CHLORIDE 0.9 % IV SOLN
INTRAVENOUS | Status: DC
  Administered 2014-10-19: 08:00:00 via INTRAVENOUS

## 2014-10-19 MED ORDER — PROMETHAZINE HCL 25 MG/ML IJ SOLN
25.0000 mg | Freq: Once | INTRAMUSCULAR | Status: AC
  Administered 2014-10-19: 25 mg via INTRAVENOUS

## 2014-10-19 MED ORDER — MEPERIDINE HCL 100 MG/ML IJ SOLN
INTRAMUSCULAR | Status: DC | PRN
  Administered 2014-10-19: 50 mg via INTRAVENOUS
  Administered 2014-10-19: 25 mg via INTRAVENOUS

## 2014-10-19 MED ORDER — LIDOCAINE VISCOUS 2 % MT SOLN
OROMUCOSAL | Status: DC | PRN
  Administered 2014-10-19: 3 mL via OROMUCOSAL

## 2014-10-19 MED ORDER — LIDOCAINE VISCOUS 2 % MT SOLN
OROMUCOSAL | Status: AC
  Filled 2014-10-19: qty 15

## 2014-10-19 MED ORDER — PROMETHAZINE HCL 25 MG/ML IJ SOLN
INTRAMUSCULAR | Status: AC
  Filled 2014-10-19: qty 1

## 2014-10-19 MED ORDER — PANTOPRAZOLE SODIUM 40 MG PO TBEC: DELAYED_RELEASE_TABLET | ORAL | Status: DC

## 2014-10-19 MED ORDER — MEPERIDINE HCL 100 MG/ML IJ SOLN
INTRAMUSCULAR | Status: DC
Start: 2014-10-19 — End: 2014-10-19
  Filled 2014-10-19: qty 2

## 2014-10-19 MED ORDER — SODIUM CHLORIDE 0.9 % IJ SOLN
INTRAMUSCULAR | Status: AC
  Filled 2014-10-19: qty 3

## 2014-10-19 MED ORDER — MIDAZOLAM HCL 5 MG/5ML IJ SOLN
INTRAMUSCULAR | Status: AC
  Filled 2014-10-19: qty 10

## 2014-10-19 NOTE — Discharge Instructions (Signed)
YOUR ABDOMINAL PAIN, CHEST PAIN AND NAUSEA ARE DUE TO GASTRITIS & REFLUX. You have gastritis DUE TO YOUR USING ASPIRIN, AND NAPROXEN. I biopsied your stomach & DUODENUM.   DRINK WATER. AVOID KOOL-AID, SWEET TEA, JUICE, & SODA.  CONTINUE YOUR WEIGHT LOSS EFFORTS. LOSE 10 LBS.  CUT DOWN ON YOUR SMOKING.  FOLLOW A LOW FAT DIET. MEATS SHOULD BE BAKED, BROILED, OR BOILED. AVOID FRIED FOODS.  AVOID FOOD THAT TRIGGERS REFLUX. SEE INFO BELOW.   CONTINUE PROTONIX. TAKE 30 MINUTES PRIOR TO MEALS TWICE DAILY.  YOUR BIOPSY RESULTS WILL BE AVAILABLE IN MY CHART AFTER JUN 29 AND MY OFFICE WILL CONTACT YOU IN 10-14 DAYS WITH YOUR RESULTS.   YOU HAVE A FOLLOW UP IN OCT 2016.  UPPER ENDOSCOPY AFTER CARE Read the instructions outlined below and refer to this sheet in the next week. These discharge instructions provide you with general information on caring for yourself after you leave the hospital. While your treatment has been planned according to the most current medical practices available, unavoidable complications occasionally occur. If you have any problems or questions after discharge, call DR. Lj Miyamoto, [phone].  ACTIVITY  You may resume your regular activity, but move at a slower pace for the next 24 hours.   Take frequent rest periods for the next 24 hours.   Walking will help get rid of the air and reduce the bloated feeling in your belly (abdomen).   No driving for 24 hours (because of the medicine (anesthesia) used during the test).   You may shower.   Do not sign any important legal documents or operate any machinery for 24 hours (because of the anesthesia used during the test).    NUTRITION  Drink plenty of fluids.   You may resume your normal diet as instructed by your doctor.   Begin with a light meal and progress to your normal diet. Heavy or fried foods are harder to digest and may make you feel sick to your stomach (nauseated).   Avoid alcoholic beverages for 24  hours or as instructed.    MEDICATIONS  You may resume your normal medications.   WHAT YOU CAN EXPECT TODAY  Some feelings of bloating in the abdomen.   Passage of more gas than usual.    IF YOU HAD A BIOPSY TAKEN DURING THE UPPER ENDOSCOPY:  Eat a soft diet IF YOU HAVE NAUSEA, BLOATING, ABDOMINAL PAIN, OR VOMITING.    FINDING OUT THE RESULTS OF YOUR TEST Not all test results are available during your visit. DR. Oneida Alar WILL CALL YOU WITHIN 14 DAYS OF YOUR PROCEDUE WITH YOUR RESULTS. Do not assume everything is normal if you have not heard from DR. Yorley Buch, CALL HER OFFICE AT [phone].  SEEK IMMEDIATE MEDICAL ATTENTION AND CALL THE OFFICE: [phone] IF:  You have more than a spotting of blood in your stool.   Your belly is swollen (abdominal distention).   You are nauseated or vomiting.   You have a temperature over 101F.   You have abdominal pain or discomfort that is severe or gets worse throughout the day.   Gastritis  Gastritis is an inflammation (the body's way of reacting to injury and/or infection) of the stomach.  It is often caused by bacterial (germ) infections. It can also be caused BY ASPIRIN, BC/GOODY POWDER'S, (IBUPROFEN) MOTRIN, OR ALEVE (NAPROXEN), chemicals (including alcohol), SPICY FOODS, and medications. This illness may be associated with generalized malaise (feeling tired, not well), UPPER ABDOMINAL STOMACH cramps, and fever. One common  bacterial cause of gastritis is an organism known as H. Pylori. This can be treated with antibiotics.     REFLUX   TREATMENT There are a number of medicines used to treat reflux including: Antacids.  PEPCID Proton-pump inhibitors: PROTONIX  HOME CARE INSTRUCTIONS Eat 2-3 hours before going to bed.  Try to reach and maintain a healthy weight. LOSE 10-20 LBS Do not eat just a few very large meals. Instead, eat 4 TO 6 smaller meals throughout the day.  Try to identify foods and beverages that make your  symptoms worse, and avoid these.  Avoid tight clothing.  Do not exercise right after eating.    Lifestyle and home remedies TO MANAGE REFLUX/CHEST PAIN  You may eliminate or reduce the frequency of heartburn by making the following lifestyle changes:   Control your weight. Being overweight is a major risk factor for heartburn and GERD. Excess pounds put pressure on your abdomen, pushing up your stomach and causing acid to back up into your esophagus.    Eat smaller meals. 4 TO 6 MEALS A DAY. This reduces pressure on the lower esophageal sphincter, helping to prevent the valve from opening and acid from washing back into your esophagus.    Loosen your belt. Clothes that fit tightly around your waist put pressure on your abdomen and the lower esophageal sphincter.    Eliminate heartburn triggers. Everyone has specific triggers. Common triggers such as fatty or fried foods, spicy food, tomato sauce, carbonated beverages, alcohol, chocolate, mint, garlic, onion, caffeine and nicotine may make heartburn worse.    Avoid stooping or bending. Tying your shoes is OK. Bending over for longer periods to weed your garden isn't, especially soon after eating.    Don't lie down after a meal. Wait at least three to four hours after eating before going to bed, and don't lie down right after eating.    PUT THE HEAD OF YOUR BED ON 6 INCH BLOCKS.   Alternative medicine  Several home remedies exist for treating GERD, but they provide only temporary relief. They include drinking baking soda (sodium bicarbonate) added to water or drinking other fluids such as baking soda mixed with cream of tartar and water.   Although these liquids create temporary relief by neutralizing, washing away or buffering acids, eventually they aggravate the situation by adding gas and fluid to your stomach, increasing pressure and causing more acid reflux. Further, adding more sodium to your diet may increase your blood  pressure and add stress to your heart, and excessive bicarbonate ingestion can alter the acid-base balance in your body.

## 2014-10-19 NOTE — H&P (View-Only) (Signed)
Subjective:    Patient ID: [name], female    DOB: 1966-02-24, 49 y.o.   MRN: [id number]  Vic Blackbird, MD  HPI ABD PAIN WORSE WITH METFORMIN AND ORAL IRON. HAVING A MENSTRUAL CYCLE EVERY 2-3 MOS: 2-3 DAYS LIGHT BLEEDING SINCE HAVING ABLATION:2014. BLACK STOOL SINCE TAKING DEC 2015. NAUSEA:  3X/WEEK. MAY EAT CRACKERS. NO HEARTBURN REALLY. VOMITING: NEVER. DRY HEAVES: MAY BE COUPLE TIMES A DAY IF SHE GETS TO COUGHING. SMOKING: 1 PACK/DAY & LATELY 1.5 PK A DAY DUE TO FEELING STRESSED. BMs: YELLOW MOSTLY. CONSTIPATED: HARD PELLET LIKE STOOLS(3X/WEEK) ASSOCIATED WITH BLOATING. RARE-LOOSE STOOLS(4X/WEEK). FEELS GASSY A LOT. HAD MID CHEST PAIN: ACHY/HEAVY(4-5 X/WEEK). SOB: DUE TO COPD. ABD PAIN: BELLY BUTTON UP, PRESSURE/ACHY. KEEPS HER AWAKE SOMETIMES(1X/WEEK). STRESS: LIVING SITUATION, BEING IN PAIN, ATTEMPTED SUICIDE MAY 2016. USES IBUPROFEN DAILY FOR BACK/LEG PAIN. THC: COUPLE TIMES A WEEK DEPENDING ON STRESS.  PT DENIES FEVER, CHILLS, HEMATOCHEZIA, HEMATEMESIS, vomiting, melena, problems swallowing, OR heartburn.  Past Medical History  Diagnosis Date  . Diabetes mellitus   . Asthmatic bronchitis     normal PFT/ seen by pulmonary no evidence of COPD  . HTN (hypertension)   . Low back pain   . Tachycardia     never had test done since no insurance  . Depression   . Shortness of breath   . Anxiety   . Gastric erosions     EGD 08/2010.  . Internal hemorrhoids     Colonoscopy 5/12.  [name] Heavy menses   . Chronic respiratory failure with hypoxia     On 2-3 L of oxygen at home  . GERD (gastroesophageal reflux disease)   . Anemia   . Arthritis   . Sleep apnea   . COPD (chronic obstructive pulmonary disease)    Past Surgical History  Procedure Laterality Date  . Cholecystectomy  1990  . Cesarean section      twice  . Kidney surgery      as child for blockages  . Tubal ligation    . Tonsillectomy    . Wrist surgery  1995    Lt wrist  . Tympanostomy tube placement      . Hysteroscopy with thermachoice  01/17/2012    Procedure: HYSTEROSCOPY WITH THERMACHOICE;  Surgeon: Florian Buff, MD;  Location: [facility];  Service: Gynecology;  Laterality: N/A;  total therapy time: 9:13sec  D5W  18 ml in, D5W   33ml out, temperture 87degrees celcious  . Uterine ablation    . Colonoscopy  09/16/2010    [id number] colon/small internal hemorrhoids  . Esophagogastroduodenoscopy  09/16/2010    SLF: normal/mild gastritis   Allergies  Allergen Reactions  . Codeine Itching and Nausea Only  . Metformin And Related Diarrhea  . Wellbutrin [Bupropion Hcl] Hives   Current Outpatient Prescriptions  Medication Sig Dispense Refill  . albuterol (PROVENTIL HFA) 108 (90 BASE) MCG/ACT inhaler Inhale 2 puffs into the lungs every 4 (four) hours as needed for wheezing or shortness of breath.    [name] albuterol (PROVENTIL) (2.5 MG/3ML) 0.083% nebulizer solution Take 3 mLs (2.5 mg total) by nebulization every 4 (four) hours as needed for wheezing.    . ARIPiprazole (ABILIFY) 2 MG tablet Take 1 tablet (2 mg total) by mouth daily.    [name] atorvastatin (LIPITOR) 10 MG tablet Take 1 tablet (10 mg total) by mouth at bedtime.    . bisoprolol (ZEBETA) 10 MG tablet Take 1 tablet (10 mg total) by mouth daily.    [name]  canagliflozin (INVOKANA) 100 MG TABS tablet Take 1 tablet (100 mg total) by mouth daily.    . DULoxetine (CYMBALTA) 60 MG capsule Take 1 capsule (60 mg total) by mouth 2 (two) times daily.    . pantoprazole (PROTONIX) 40 MG tablet Take 1 tablet (40 mg total) by mouth daily.    . pregabalin (LYRICA) 100 MG capsule Take 1 capsule (100 mg total) by mouth 3 (three) times daily.    . roflumilast (DALIRESP) 500 MCG TABS tablet Take 1 tablet (500 mcg total) by mouth daily.    . SitaGLIPtin Phosphate (JANUVIA PO) Take 100 mg by mouth daily.    . traZODone (DESYREL) 50 MG tablet Take 1 tablet (50 mg total) by mouth at bedtime.     Family History  Problem Relation Age of Onset  . Heart attack Father 76     deceased, etoh use  . Heart disease Father   . Alcohol abuse Father   . Depression Father   . Heart attack Mother 55    deceased  . Diabetes Mother   . Breast cancer Mother   . Heart failure Mother     oxygen dependence, nonsmoker  . Heart disease Mother   . Depression Mother   . Cancer Mother   . Colon cancer Neg Hx   . Liver disease Maternal Aunt 23    died while on liver transplant list  . Heart attack Maternal Grandmother     premature CAD  . Ulcers Sister   . Hypertension Sister     History  Substance Use Topics  . Smoking status: Current Every Day Smoker -- 0.50 packs/day for 30 years    Types: Cigarettes  . Smokeless tobacco: Never Used     Comment: using the vapor cigarettes  . Alcohol Use: 0.0 oz/week    0 Standard drinks or equivalent per week     Comment: social use   Review of Systems PER HPI OTHERWISE ALL SYSTEMS ARE NEGATIVE.    Objective:   Physical Exam  Constitutional: She is oriented to person, place, and time. She appears well-developed and well-nourished. No distress.  HENT:  Head: Normocephalic and atraumatic.  Mouth/Throat: Oropharynx is clear and moist. No oropharyngeal exudate.  Eyes: Pupils are equal, round, and reactive to light. No scleral icterus.  Neck: Normal range of motion. Neck supple.  Cardiovascular: Normal rate, regular rhythm and normal heart sounds.   Pulmonary/Chest: Effort normal and breath sounds normal. No respiratory distress.  Abdominal: Soft. Bowel sounds are normal. She exhibits no distension. There is tenderness. There is no rebound and no guarding.  MODERATE TTP IN RUQ/RLQ, MILD BLQs TTP    Musculoskeletal: She exhibits no edema.  Lymphadenopathy:    She has no cervical adenopathy.  Neurological: She is alert and oriented to person, place, and time.  NO FOCAL DEFICITS   Psychiatric:  FLAT AFFECT, SLIGHTLY DEPRESSED MOOD   Vitals reviewed.         Assessment & Plan:

## 2014-10-19 NOTE — Interval H&P Note (Signed)
History and Physical Interval Note:  10/19/2014 8:11 AM  [name]  has presented today for surgery, with the diagnosis of dyspepsia  The various methods of treatment have been discussed with the patient and family. After consideration of risks, benefits and other options for treatment, the patient has consented to  Procedure(s) with comments: ESOPHAGOGASTRODUODENOSCOPY (EGD) (N/A) - 830 as a surgical intervention .  The patient's history has been reviewed, patient examined, no change in status, stable for surgery.  I have reviewed the patient's chart and labs.  Questions were answered to the patient's satisfaction.     [employer]

## 2014-10-19 NOTE — Op Note (Signed)
[facility] [address] [place], [address]   ENDOSCOPY PROCEDURE REPORT  PATIENT: Sheila, [name]  MR#: [id number] BIRTHDATE: 02-27-1966 , 48  yrs. old GENDER: female  ENDOSCOPIST: Danie Binder, MD REFERRED MV:HQIONGE Trommald, M.D.  PROCEDURE DATE: 10-26-14 PROCEDURE:   EGD w/ biopsy  INDICATIONS:dyspepsia.-INTERMITTENT CONSTIPATION/DIARRHEA, BMI> 40. STILLSMOKING. USES ASA/NAPROXEN MEDICATIONS: Promethazine (Phenergan) 25 mg IV, Demerol 75 mg IV, and Versed 5 mg IV TOPICAL ANESTHETIC:   Viscous Xylocaine ASA CLASS:  DESCRIPTION OF PROCEDURE:     Physical exam was performed.  Informed consent was obtained from the patient after explaining the benefits, risks, and alternatives to the procedure.  The patient was connected to the monitor and placed in the left lateral position.  Continuous oxygen was provided by nasal cannula and IV medicine administered through an indwelling cannula.  After administration of sedation, the patients esophagus was intubated and the [id number] ([id number])  endoscope was advanced under direct visualization to the second portion of the duodenum.  The scope was removed slowly by carefully examining the color, texture, anatomy, and integrity of the mucosa on the way out.  The patient was recovered in endoscopy and discharged home in satisfactory condition.  Estimated blood loss is zero unless otherwise noted in this procedure report.    ESOPHAGUS: The mucosa of the esophagus appeared normal.   STOMACH: Mild non-erosive gastritis (inflammation) was found in the gastric antrum and gastric body.  Multiple biopsies were performed using cold forceps.   DUODENUM: The duodenal mucosa showed no abnormalities in the bulb and 2nd part of the duodenum.  Cold forceps biopsies were taken in the bulb and second portion. COMPLICATIONS: There were no immediate complications.  ENDOSCOPIC IMPRESSION: 1.   ABDOMINALPAIN/CHEST PAIN/NAUSEA DUE  TO GERD/GASTRITIS  RECOMMENDATIONS: DRINK WATER.  AVOID KOOL-AID, SWEET TEA, JUICE, & SODA. CONTINUE WEIGHT LOSS EFFORTS.  LOSE 10 LBS. FOLLOW A LOW FAT DIET.  MEATS SHOULD BE BAKED, BROILED, OR BOILED. AVOID FRIED FOODS. AVOID FOOD THAT TRIGGERS REFLUX. PROTONIX 30 MINUTES PRIOR TO MEALS TWICE DAILY. AWAIT BIOPSY RESULTS. FOLLOW UP IN OCT 2016.  REPEAT EXAM:  eSigned:  Danie Binder, MD October 26, 2014 9:26 AM  CPT CODES: ICD CODES:  The ICD and CPT codes recommended by this software are interpretations from the data that the clinical staff has captured with the software.  The verification of the translation of this report to the ICD and CPT codes and modifiers is the sole responsibility of the health care institution and practicing physician where this report was generated.  [employer] will not be held responsible for the validity of the ICD and CPT codes included on this report.  AMA assumes no liability for data contained or not contained herein. CPT is a Designer, television/film set of the [employer].

## 2014-10-19 NOTE — OR Nursing (Signed)
Per Dr Oneida Alar order: removed CO2 monitor from patient due to not allowing seal around CPAP.

## 2014-10-20 ENCOUNTER — Encounter (HOSPITAL_COMMUNITY): Payer: Self-pay | Admitting: Psychiatry

## 2014-10-20 ENCOUNTER — Ambulatory Visit (INDEPENDENT_AMBULATORY_CARE_PROVIDER_SITE_OTHER): Payer: Medicare HMO | Admitting: Psychiatry

## 2014-10-20 ENCOUNTER — Other Ambulatory Visit: Payer: Self-pay | Admitting: Nurse Practitioner

## 2014-10-20 VITALS — BP 125/73 | HR 84 | Ht 64.75 in | Wt 282.0 lb

## 2014-10-20 DIAGNOSIS — F1910 Other psychoactive substance abuse, uncomplicated: Secondary | ICD-10-CM | POA: Diagnosis not present

## 2014-10-20 DIAGNOSIS — F411 Generalized anxiety disorder: Secondary | ICD-10-CM

## 2014-10-20 DIAGNOSIS — F332 Major depressive disorder, recurrent severe without psychotic features: Secondary | ICD-10-CM | POA: Diagnosis not present

## 2014-10-20 DIAGNOSIS — F331 Major depressive disorder, recurrent, moderate: Secondary | ICD-10-CM

## 2014-10-20 MED ORDER — TRAZODONE HCL 50 MG PO TABS: 50.0000 mg | ORAL_TABLET | Freq: Every day | ORAL | Status: DC

## 2014-10-20 MED ORDER — ARIPIPRAZOLE 2 MG PO TABS: 2.0000 mg | ORAL_TABLET | Freq: Every day | ORAL | Status: DC

## 2014-10-20 MED ORDER — AMPHETAMINE-DEXTROAMPHETAMINE 20 MG PO TABS: 20.0000 mg | ORAL_TABLET | Freq: Two times a day (BID) | ORAL | Status: DC

## 2014-10-20 MED ORDER — DULOXETINE HCL 60 MG PO CPEP: 60.0000 mg | ORAL_CAPSULE | Freq: Two times a day (BID) | ORAL | Status: DC
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
## 2014-10-22 ENCOUNTER — Encounter (HOSPITAL_COMMUNITY): Payer: Medicare HMO

## 2014-10-22 NOTE — Progress Notes (Signed)
Patient understands return instructions.

## 2014-10-23 ENCOUNTER — Encounter (HOSPITAL_COMMUNITY): Payer: Self-pay | Admitting: Gastroenterology

## 2014-10-26 ENCOUNTER — Telehealth: Payer: Self-pay | Admitting: Gastroenterology

## 2014-10-26 NOTE — Telephone Encounter (Signed)
Please call pt. HER stomach Bx gastritis.  HER SMALL BOWEL BIOPSIES ARE NORMAL. HER ABDOMINAL PAIN, CHEST PAIN AND NAUSEA ARE DUE TO GASTRITIS & REFLUX. SHE HAS  gastritis DUE TO USING ASPIRIN, AND NAPROXEN.   DRINK WATER. AVOID KOOL-AID, SWEET TEA, JUICE, & SODA.  CONTINUE YOUR WEIGHT LOSS EFFORTS. LOSE 10 LBS.  CUT DOWN ON YOUR SMOKING.  FOLLOW A LOW FAT DIET. MEATS SHOULD BE BAKED, BROILED, OR BOILED. AVOID FRIED FOODS.  AVOID FOOD THAT TRIGGERS REFLUX.   CONTINUE PROTONIX. TAKE 30 MINUTES PRIOR TO MEALS TWICE DAILY.  FOLLOW UP IN OCT 2016 E30 GASTRITIS/ABDOMINAL PAIN/CHESTPAIN/NAUSEA.

## 2014-10-27 NOTE — Telephone Encounter (Signed)
Reminder in epic °

## 2014-10-27 NOTE — Telephone Encounter (Signed)
LM for a return call.  

## 2014-10-28 NOTE — Telephone Encounter (Signed)
Letter mailed to pt to call.  

## 2014-11-02 ENCOUNTER — Ambulatory Visit (HOSPITAL_COMMUNITY): Payer: Self-pay | Admitting: Psychiatry

## 2014-11-03 ENCOUNTER — Ambulatory Visit (HOSPITAL_COMMUNITY): Payer: Self-pay | Admitting: Psychology

## 2014-11-04 NOTE — Telephone Encounter (Signed)
Pt called and is aware of her results/ recommendations.

## 2014-11-12 ENCOUNTER — Encounter: Payer: Self-pay | Admitting: Family Medicine

## 2014-11-13 ENCOUNTER — Telehealth (HOSPITAL_COMMUNITY): Payer: Self-pay | Admitting: *Deleted

## 2014-11-13 ENCOUNTER — Other Ambulatory Visit (HOSPITAL_COMMUNITY): Payer: Self-pay | Admitting: Psychiatry

## 2014-11-13 MED ORDER — FLUCONAZOLE 150 MG PO TABS: 150.0000 mg | ORAL_TABLET | ORAL | Status: DC

## 2014-11-13 MED ORDER — SULFAMETHOXAZOLE-TRIMETHOPRIM 800-160 MG PO TABS: 1.0000 | ORAL_TABLET | Freq: Two times a day (BID) | ORAL | Status: DC

## 2014-11-13 MED ORDER — AMPHETAMINE-DEXTROAMPHETAMINE 20 MG PO TABS: 20.0000 mg | ORAL_TABLET | Freq: Two times a day (BID) | ORAL | Status: DC

## 2014-11-13 NOTE — Telephone Encounter (Signed)
My Chart message back to pt as I have no phone number.  RX's to pharmacy.

## 2014-11-13 NOTE — Telephone Encounter (Signed)
PATIENT CALLED FOR REFILL OF ADDERALL

## 2014-11-13 NOTE — Telephone Encounter (Signed)
printed

## 2014-11-16 ENCOUNTER — Telehealth (HOSPITAL_COMMUNITY): Payer: Self-pay | Admitting: *Deleted

## 2014-11-16 NOTE — Telephone Encounter (Signed)
Phone call from patient, she need refill of Adderall.

## 2014-11-16 NOTE — Telephone Encounter (Signed)
Pt aware.

## 2014-11-16 NOTE — Telephone Encounter (Signed)
Pt is aware of refill on medication.

## 2014-11-17 ENCOUNTER — Telehealth (HOSPITAL_COMMUNITY): Payer: Self-pay | Admitting: *Deleted

## 2014-11-17 NOTE — Telephone Encounter (Signed)
Pt came into office to pick up pronted script. Pt D/L number is [id number] with expiration date of 03-04-2020. Pt agreed with script.

## 2014-11-30 ENCOUNTER — Ambulatory Visit (HOSPITAL_COMMUNITY): Payer: Self-pay | Admitting: Psychiatry

## 2014-12-02 ENCOUNTER — Ambulatory Visit: Payer: Medicare HMO | Admitting: Family Medicine

## 2014-12-08 ENCOUNTER — Ambulatory Visit (HOSPITAL_COMMUNITY): Payer: Self-pay | Admitting: Hematology & Oncology

## 2014-12-15 ENCOUNTER — Other Ambulatory Visit (HOSPITAL_COMMUNITY): Payer: Self-pay | Admitting: Psychiatry

## 2014-12-15 ENCOUNTER — Telehealth (HOSPITAL_COMMUNITY): Payer: Self-pay | Admitting: *Deleted

## 2014-12-15 MED ORDER — AMPHETAMINE-DEXTROAMPHETAMINE 20 MG PO TABS: 20.0000 mg | ORAL_TABLET | Freq: Two times a day (BID) | ORAL | Status: DC

## 2014-12-15 NOTE — Telephone Encounter (Signed)
Pt called and requested printed script for her Adderall pt, she took the last script 12-14-14. Pt f/u appt is scheduled for 12-21-14. Pt script was last printed 11-13-14. Pt number is [phone]

## 2014-12-15 NOTE — Telephone Encounter (Signed)
printed

## 2014-12-16 ENCOUNTER — Telehealth (HOSPITAL_COMMUNITY): Payer: Self-pay | Admitting: *Deleted

## 2014-12-16 NOTE — Telephone Encounter (Signed)
Pt came into office to pick up her printed script. Pt D/L number is [id number]. Pt agreed with medication

## 2014-12-18 NOTE — Telephone Encounter (Signed)
Pt picked printed script up

## 2014-12-21 ENCOUNTER — Encounter (HOSPITAL_COMMUNITY): Payer: Self-pay | Admitting: Psychiatry

## 2014-12-21 ENCOUNTER — Ambulatory Visit (INDEPENDENT_AMBULATORY_CARE_PROVIDER_SITE_OTHER): Payer: Medicare HMO | Admitting: Psychiatry

## 2014-12-21 VITALS — BP 121/60 | HR 71 | Ht 64.5 in | Wt 267.0 lb

## 2014-12-21 DIAGNOSIS — F332 Major depressive disorder, recurrent severe without psychotic features: Secondary | ICD-10-CM

## 2014-12-21 DIAGNOSIS — F1910 Other psychoactive substance abuse, uncomplicated: Secondary | ICD-10-CM

## 2014-12-21 DIAGNOSIS — F411 Generalized anxiety disorder: Secondary | ICD-10-CM | POA: Diagnosis not present

## 2014-12-21 DIAGNOSIS — F331 Major depressive disorder, recurrent, moderate: Secondary | ICD-10-CM

## 2014-12-21 MED ORDER — ARIPIPRAZOLE 2 MG PO TABS: 2.0000 mg | ORAL_TABLET | Freq: Every day | ORAL | Status: DC

## 2014-12-21 MED ORDER — DULOXETINE HCL 60 MG PO CPEP: 60.0000 mg | ORAL_CAPSULE | Freq: Two times a day (BID) | ORAL | Status: DC

## 2014-12-21 MED ORDER — TRAZODONE HCL 50 MG PO TABS: 50.0000 mg | ORAL_TABLET | Freq: Every day | ORAL | Status: DC

## 2014-12-21 MED ORDER — AMPHETAMINE-DEXTROAMPHETAMINE 20 MG PO TABS: 20.0000 mg | ORAL_TABLET | Freq: Two times a day (BID) | ORAL | Status: DC
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
## 2014-12-30 ENCOUNTER — Encounter: Payer: Self-pay | Admitting: Gastroenterology

## 2015-01-02 NOTE — Progress Notes (Signed)
This encounter was created in error - please disregard.

## 2015-03-10 ENCOUNTER — Ambulatory Visit (INDEPENDENT_AMBULATORY_CARE_PROVIDER_SITE_OTHER): Payer: Medicare HMO | Admitting: Family Medicine

## 2015-03-10 ENCOUNTER — Encounter: Payer: Self-pay | Admitting: Family Medicine

## 2015-03-10 VITALS — BP 132/74 | HR 88 | Temp 98.5°F | Resp 16 | Ht 65.0 in | Wt 266.0 lb

## 2015-03-10 DIAGNOSIS — E1143 Type 2 diabetes mellitus with diabetic autonomic (poly)neuropathy: Secondary | ICD-10-CM | POA: Diagnosis not present

## 2015-03-10 DIAGNOSIS — G63 Polyneuropathy in diseases classified elsewhere: Secondary | ICD-10-CM | POA: Diagnosis not present

## 2015-03-10 DIAGNOSIS — I10 Essential (primary) hypertension: Secondary | ICD-10-CM

## 2015-03-10 DIAGNOSIS — D6489 Other specified anemias: Secondary | ICD-10-CM

## 2015-03-10 DIAGNOSIS — Z23 Encounter for immunization: Secondary | ICD-10-CM | POA: Diagnosis not present

## 2015-03-10 DIAGNOSIS — J4531 Mild persistent asthma with (acute) exacerbation: Secondary | ICD-10-CM | POA: Diagnosis not present

## 2015-03-10 DIAGNOSIS — D509 Iron deficiency anemia, unspecified: Secondary | ICD-10-CM | POA: Diagnosis not present

## 2015-03-10 LAB — CBC WITH DIFFERENTIAL/PLATELET
Basophils Absolute: 0 10*3/uL (ref 0.0–0.1)
Basophils Relative: 0 % (ref 0–1)
Eosinophils Absolute: 0.1 10*3/uL (ref 0.0–0.7)
Eosinophils Relative: 1 % (ref 0–5)
HCT: 40.1 % (ref 36.0–46.0)
Hemoglobin: 12.8 g/dL (ref 12.0–15.0)
Lymphocytes Relative: 26 % (ref 12–46)
Lymphs Abs: 3.1 10*3/uL (ref 0.7–4.0)
MCH: 24.3 pg — ABNORMAL LOW (ref 26.0–34.0)
MCHC: 31.9 g/dL (ref 30.0–36.0)
MCV: 76.1 fL — ABNORMAL LOW (ref 78.0–100.0)
MPV: 9.1 fL (ref 8.6–12.4)
Monocytes Absolute: 0.7 10*3/uL (ref 0.1–1.0)
Monocytes Relative: 6 % (ref 3–12)
Neutro Abs: 7.9 10*3/uL — ABNORMAL HIGH (ref 1.7–7.7)
Neutrophils Relative %: 67 % (ref 43–77)
Platelets: 270 10*3/uL (ref 150–400)
RBC: 5.27 MIL/uL — ABNORMAL HIGH (ref 3.87–5.11)
RDW: 18.1 % — ABNORMAL HIGH (ref 11.5–15.5)
WBC: 11.8 10*3/uL — ABNORMAL HIGH (ref 4.0–10.5)

## 2015-03-10 LAB — LIPID PANEL
Cholesterol: 140 mg/dL (ref 125–200)
HDL: 43 mg/dL — ABNORMAL LOW (ref >=46)
LDL Cholesterol: 84 mg/dL (ref <130)
Total CHOL/HDL Ratio: 3.3 Ratio (ref <=5.0)
Triglycerides: 65 mg/dL (ref <150)
VLDL: 13 mg/dL (ref <30)

## 2015-03-10 LAB — IRON AND TIBC
%SAT: 5 % — ABNORMAL LOW (ref 11–50)
Iron: 18 ug/dL — ABNORMAL LOW (ref 40–190)
TIBC: 361 ug/dL (ref 250–450)
UIBC: 343 ug/dL (ref 125–400)

## 2015-03-10 LAB — COMPREHENSIVE METABOLIC PANEL
ALT: 10 U/L (ref 6–29)
AST: 11 U/L (ref 10–35)
Albumin: 3.9 g/dL (ref 3.6–5.1)
Alkaline Phosphatase: 56 U/L (ref 33–115)
BUN: 10 mg/dL (ref 7–25)
CO2: 28 mmol/L (ref 20–31)
Calcium: 9.2 mg/dL (ref 8.6–10.2)
Chloride: 100 mmol/L (ref 98–110)
Creat: 0.67 mg/dL (ref 0.50–1.10)
Glucose, Bld: 109 mg/dL — ABNORMAL HIGH (ref 70–99)
Potassium: 4.3 mmol/L (ref 3.5–5.3)
Sodium: 135 mmol/L (ref 135–146)
Total Bilirubin: 0.5 mg/dL (ref 0.2–1.2)
Total Protein: 6.6 g/dL (ref 6.1–8.1)

## 2015-03-10 LAB — HEMOGLOBIN A1C
Hgb A1c MFr Bld: 7.2 % — ABNORMAL HIGH (ref <5.7)
Mean Plasma Glucose: 160 mg/dL — ABNORMAL HIGH (ref <117)

## 2015-03-10 MED ORDER — BISOPROLOL FUMARATE 10 MG PO TABS: 10.0000 mg | ORAL_TABLET | Freq: Every day | ORAL | Status: DC

## 2015-03-10 MED ORDER — PREGABALIN 150 MG PO CAPS: 150.0000 mg | ORAL_CAPSULE | Freq: Two times a day (BID) | ORAL | Status: DC

## 2015-03-10 MED ORDER — CANAGLIFLOZIN 100 MG PO TABS: 100.0000 mg | ORAL_TABLET | Freq: Every day | ORAL | Status: DC

## 2015-03-10 MED ORDER — FAMOTIDINE 20 MG PO TABS: 20.0000 mg | ORAL_TABLET | Freq: Every evening | ORAL | Status: DC | PRN

## 2015-03-10 MED ORDER — ROFLUMILAST 500 MCG PO TABS: 500.0000 ug | ORAL_TABLET | Freq: Every day | ORAL | Status: DC

## 2015-03-10 MED ORDER — PANTOPRAZOLE SODIUM 40 MG PO TBEC: DELAYED_RELEASE_TABLET | ORAL | Status: DC

## 2015-03-10 MED ORDER — ATORVASTATIN CALCIUM 10 MG PO TABS: 10.0000 mg | ORAL_TABLET | Freq: Every day | ORAL | Status: DC

## 2015-03-10 MED ORDER — PREDNISONE 10 MG PO TABS: ORAL_TABLET | ORAL | Status: DC

## 2015-03-10 MED ORDER — JANUVIA 100 MG PO TABS: 100.0000 mg | ORAL_TABLET | Freq: Every day | ORAL | Status: DC

## 2015-03-10 MED ORDER — ALBUTEROL SULFATE HFA 108 (90 BASE) MCG/ACT IN AERS: 2.0000 | INHALATION_SPRAY | RESPIRATORY_TRACT | Status: DC | PRN

## 2015-03-10 NOTE — Assessment & Plan Note (Signed)
This is likely due to the change in weather. I do not see any overt infection today. No asthma or COPD exacerbation. Advised her to use some Mucinex or Robitussin. [place] given her prescription for prednisone in case she deteriorates. She will continue her regular inhalers as well as her Daliresp flu shot given today

## 2015-03-10 NOTE — Assessment & Plan Note (Signed)
Controlled no change in medication will check her fasting labs today including her cholesterol.

## 2015-03-10 NOTE — Assessment & Plan Note (Signed)
Change her Lyrica to 150 mg twice a day to help with compliance

## 2015-03-10 NOTE — Patient Instructions (Signed)
Flu shot given Start steroids if not improved We will call with lab results Rosalia F/U 6 months

## 2015-03-10 NOTE — Progress Notes (Signed)
Patient ID: [name], female   DOB: 09/20/1965, 49 y.o.   MRN: [id number]   Subjective:    Patient ID: [name], female    DOB: 07-Aug-1965, 49 y.o.   MRN: [id number]  Patient presents for F/U and Illness pt here for follow-up. [place]. She has not followed up since May. She has history of diabetes mellitus she is on Invokana, and Januvia her last A1c was 7.8%. She states her blood sugars have all been less than 200 which she did not bring her meter with her today. She denies any hypoglycemia.  She has history of depression and anxiety she's actually hospitalized [facility] the summer. She is following with psychiatry and they have her on Abilify trazodone Adderall and Cymbalta.  She has history of chronic pain as well as neuropathy. She is no longer in the pain clinic. She is taking Lyrica but forgets to take her evening dose she would like to go back to 150 mg twice a day.  History of asthma with bronchitis she's had some mild cough with production of little sinus drainage no fever she has not required the use of her nebulizer. She is not taking any Mucinex or Robitussin yet.Continues to smoke     Review Of Systems:  GEN- denies fatigue, fever, weight loss,weakness, recent illness HEENT- denies eye drainage, change in vision, +nasal discharge, CVS- denies chest pain, palpitations RESP- denies SOB,+ cough, wheeze ABD- denies N/V, change in stools, abd pain GU- denies dysuria, hematuria, dribbling, incontinence MSK-+joint pain, muscle aches, injury Neuro- denies headache, dizziness, syncope, seizure activity       Objective:    BP 132/74 mmHg  Pulse 88  Temp(Src) 98.5 F (36.9 C) (Oral)  Resp 16  Ht 5\' 5"  (1.651 m)  Wt 266 lb (120.657 kg)  BMI 44.26 kg/m2 GEN- NAD, alert and oriented x3 HEENT- PERRL, EOMI, non injected sclera, pink conjunctiva, MMM, oropharynx clear Neck- Supple, no LAD CVS- RRR, no  murmur RESP-upper airway congestion,no wheeze, normal WOB ABD-NABS,soft,NT,ND EXT- No edema Pulses- Radial, DP- 2+        Assessment & Plan:      Problem List Items Addressed This Visit    None      Note: This dictation was prepared with Dragon dictation along with smaller phrase technology. Any transcriptional errors that result from this process are unintentional.

## 2015-03-10 NOTE — Assessment & Plan Note (Signed)
Diabetes with diabetic neuropathy. Her diabetes is uncontrolled goal is less than 7%. I will recheck her A1c today she is on Invokana and Januvia next step would be addition of injectable.

## 2015-04-12 ENCOUNTER — Telehealth (HOSPITAL_COMMUNITY): Payer: Self-pay | Admitting: *Deleted

## 2015-04-12 NOTE — Telephone Encounter (Signed)
Met with Dr. Dwyane Dee to review patient's record and informed [name], RMA patient would have to wait until Dr. Harrington Challenger return to request Adderall refill as last order provided 12/21/14.

## 2015-04-12 NOTE — Telephone Encounter (Signed)
Pt called stating she is out of refills for her Adderall. Per pt, her last tablet is tomorrow. Pt medication was last filled 12-21-14 and pt was last seen on that day as well. Pt is aware Dr. Harrington Challenger is not in the office but message will be sent to her fill in person. Per pt, is it ok for her to be off of this medication after tomorrow until provider comes in office? Informed pt that message will be sent to the person that is helping Dr. Harrington Challenger. Pt f/u appt is 05-06-14. Pt number is [phone].

## 2015-04-12 NOTE — Telephone Encounter (Signed)
Message sent to Ubaldo Glassing to have patient follow up with Dr. Harrington Challenger upon her return for refill of Adderall as was last filled 12/21/14 and per Dr. Dwyane Dee instruction.

## 2015-04-12 NOTE — Telephone Encounter (Signed)
phone call from patient need refill of Adderall.

## 2015-04-13 NOTE — Telephone Encounter (Signed)
Pt is informed and stated she will call next week to leave a message for Dr. Harrington Challenger when she gets in to see if she could get it refilled

## 2015-04-13 NOTE — Telephone Encounter (Signed)
Pt is informed.

## 2015-04-20 ENCOUNTER — Encounter (HOSPITAL_COMMUNITY): Payer: Self-pay | Admitting: *Deleted

## 2015-04-20 ENCOUNTER — Telehealth (HOSPITAL_COMMUNITY): Payer: Self-pay | Admitting: *Deleted

## 2015-04-20 ENCOUNTER — Other Ambulatory Visit (HOSPITAL_COMMUNITY): Payer: Self-pay | Admitting: Psychiatry

## 2015-04-20 MED ORDER — AMPHETAMINE-DEXTROAMPHETAMINE 20 MG PO TABS: 20.0000 mg | ORAL_TABLET | Freq: Two times a day (BID) | ORAL | Status: DC

## 2015-04-20 NOTE — Telephone Encounter (Signed)
One month supply printed

## 2015-04-20 NOTE — Telephone Encounter (Signed)
phone call from patient stated she need refill of Adderall.

## 2015-04-20 NOTE — Progress Notes (Signed)
Pt came into office to pick up printed script. Pt D/L number is [id number] with expiration number of 03-04-20. Pt agrees with printed script.

## 2015-04-20 NOTE — Telephone Encounter (Signed)
Pt came into office to pick up printed script for her addreall

## 2015-04-20 NOTE — Telephone Encounter (Signed)
Pt is aware printed script is ready for pick up.

## 2015-05-07 ENCOUNTER — Ambulatory Visit (INDEPENDENT_AMBULATORY_CARE_PROVIDER_SITE_OTHER): Payer: Medicare HMO | Admitting: Psychiatry

## 2015-05-07 ENCOUNTER — Encounter (HOSPITAL_COMMUNITY): Payer: Self-pay | Admitting: Psychiatry

## 2015-05-07 VITALS — BP 118/70 | Ht 65.0 in | Wt 262.0 lb

## 2015-05-07 DIAGNOSIS — F411 Generalized anxiety disorder: Secondary | ICD-10-CM

## 2015-05-07 DIAGNOSIS — F331 Major depressive disorder, recurrent, moderate: Secondary | ICD-10-CM

## 2015-05-07 DIAGNOSIS — F1010 Alcohol abuse, uncomplicated: Secondary | ICD-10-CM | POA: Diagnosis not present

## 2015-05-07 MED ORDER — AMPHETAMINE-DEXTROAMPHETAMINE 20 MG PO TABS: 20.0000 mg | ORAL_TABLET | Freq: Two times a day (BID) | ORAL | Status: DC

## 2015-05-07 MED ORDER — DULOXETINE HCL 60 MG PO CPEP: 60.0000 mg | ORAL_CAPSULE | Freq: Two times a day (BID) | ORAL | Status: DC
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
## 2015-09-24 ENCOUNTER — Other Ambulatory Visit: Payer: Self-pay | Admitting: Family Medicine

## 2015-09-24 NOTE — Telephone Encounter (Signed)
Okay to refill? 

## 2015-09-24 NOTE — Telephone Encounter (Signed)
Medication called to pharmacy. 

## 2015-09-24 NOTE — Telephone Encounter (Signed)
Ok to refill??  Last office visit/ refill 03/10/2015, #3 refills.

## 2015-10-18 DIAGNOSIS — E1169 Type 2 diabetes mellitus with other specified complication: Secondary | ICD-10-CM

## 2015-10-18 DIAGNOSIS — K219 Gastro-esophageal reflux disease without esophagitis: Secondary | ICD-10-CM | POA: Insufficient documentation

## 2015-10-18 DIAGNOSIS — E785 Hyperlipidemia, unspecified: Secondary | ICD-10-CM

## 2015-10-18 HISTORY — DX: Type 2 diabetes mellitus with other specified complication: E11.69

## 2015-10-18 HISTORY — DX: Hyperlipidemia, unspecified: E78.5

## 2015-12-16 ENCOUNTER — Other Ambulatory Visit: Payer: Self-pay | Admitting: Family Medicine

## 2015-12-16 NOTE — Telephone Encounter (Signed)
Refill appropriate and filled per protocol. 

## 2016-01-14 DIAGNOSIS — F909 Attention-deficit hyperactivity disorder, unspecified type: Secondary | ICD-10-CM | POA: Insufficient documentation

## 2016-01-26 ENCOUNTER — Encounter: Payer: Self-pay | Admitting: *Deleted

## 2016-01-27 ENCOUNTER — Other Ambulatory Visit: Payer: Self-pay | Admitting: *Deleted

## 2016-01-27 MED ORDER — FAMOTIDINE 20 MG PO TABS: 20.0000 mg | ORAL_TABLET | Freq: Every evening | ORAL | 0 refills | Status: DC | PRN

## 2016-01-27 NOTE — Telephone Encounter (Signed)
Received fax requesting refill on Pepcid with 90 day prescription.   Medication filled x1 with no refills.   Requires office visit before any further refills can be given.   Letter sent.

## 2016-01-30 ENCOUNTER — Other Ambulatory Visit: Payer: Self-pay | Admitting: Family Medicine

## 2016-02-01 NOTE — Telephone Encounter (Signed)
Medication refill for one time only.  Patient needs to be seen.  Letter sent for patient to call and schedule 

## 2016-02-04 IMAGING — CT ABDOMEN^XXL_ABD_PEL_WITH_CONTRAST (ADULT)
2 of 5 series · 17 of 46 positions shown, 19 images · IV contrast (omnipaque)
Comparison: 09/01/2010

CLINICAL DATA: Diffuse abdominal pain for 6 months. Constipation
and diarrhea with nausea. Cholecystectomy and tubal ligation.
Anemia.

EXAM:
CT ABDOMEN AND PELVIS WITH CONTRAST
TECHNIQUE: Multidetector CT imaging of the abdomen and pelvis was performed
using the standard protocol following bolus administration of
intravenous contrast.
CONTRAST:  100mL OMNIPAQUE IOHEXOL 300 MG/ML  SOLN

[Series 2: abd_pel_with 5.0 b40s · axial · 0.98mm/px · z∈[-456,-36]mm · 14 of 96 slices shown, 16 images]
[im 6/96  soft-tissue]
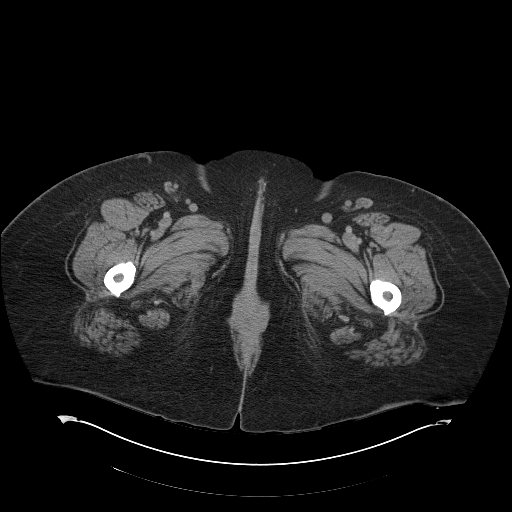
[im 6/96  bone]
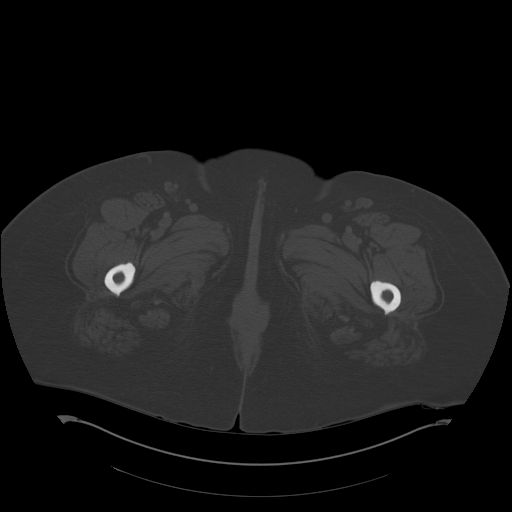
[im 12/96  soft-tissue]
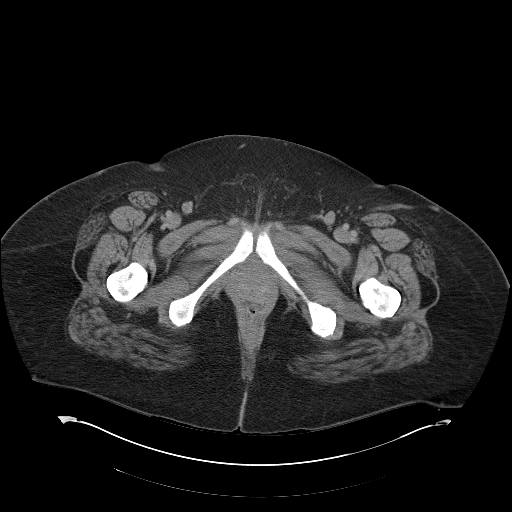
[im 17/96  soft-tissue]
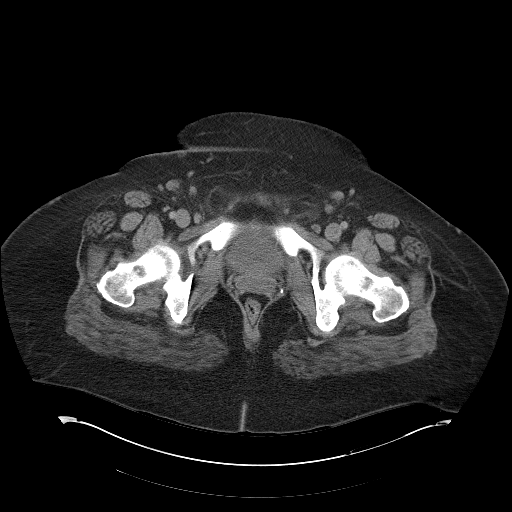
[im 28/96  soft-tissue]
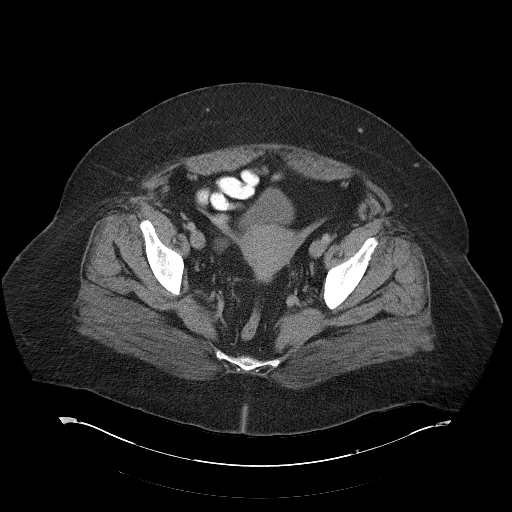
[im 34/96  soft-tissue]
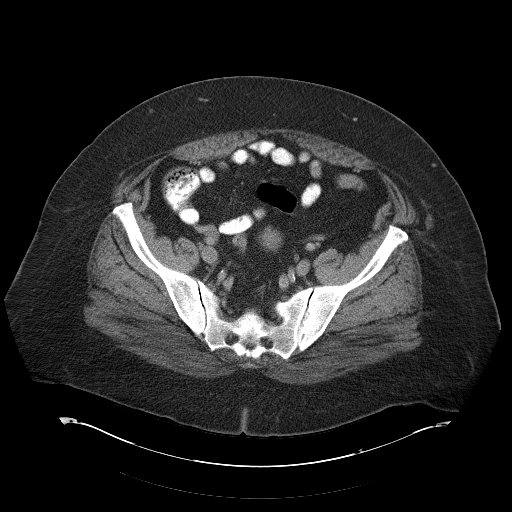
[im 40/96  soft-tissue]
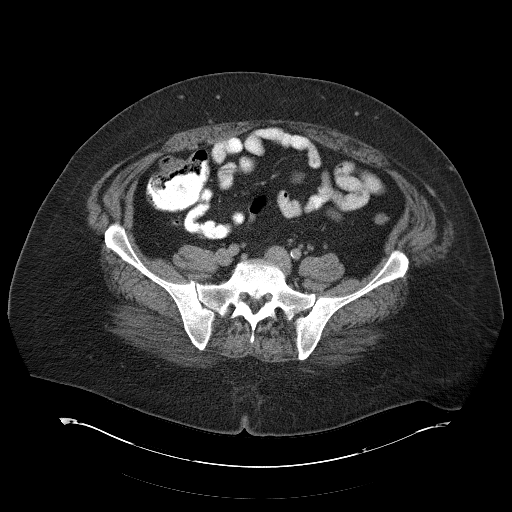
[im 45/96  soft-tissue]
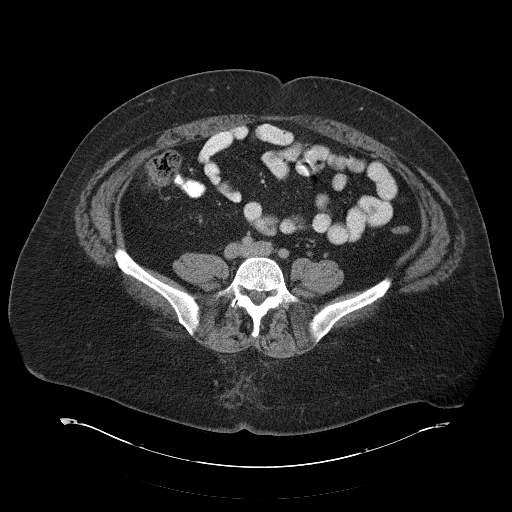
[im 51/96  soft-tissue]
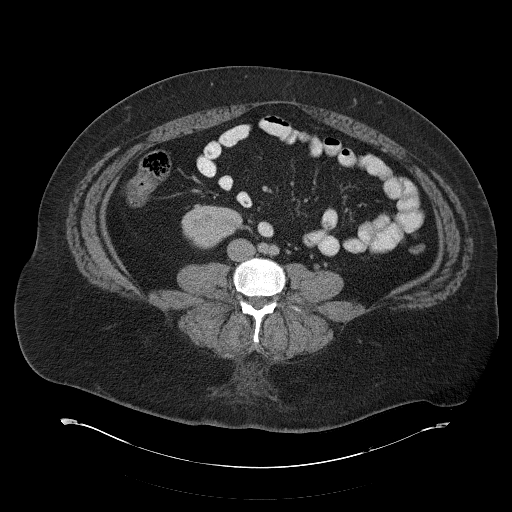
[im 56/96  soft-tissue]
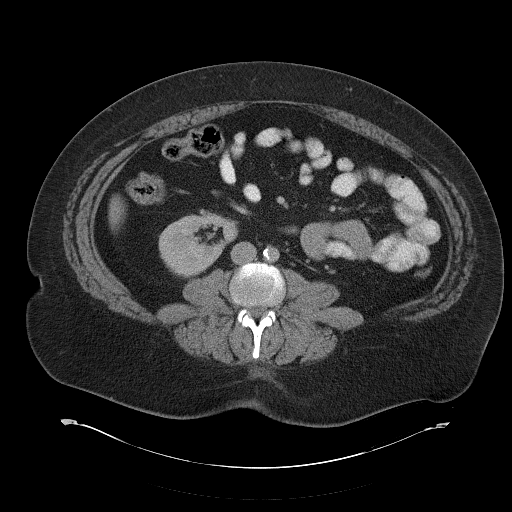
[im 56/96  bone]
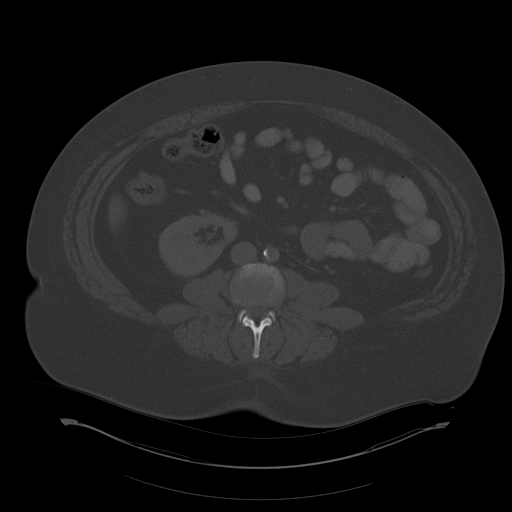
[im 62/96  soft-tissue]
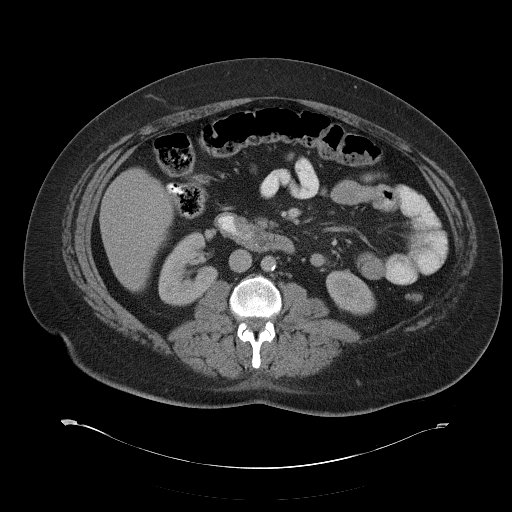
[im 73/96  soft-tissue]
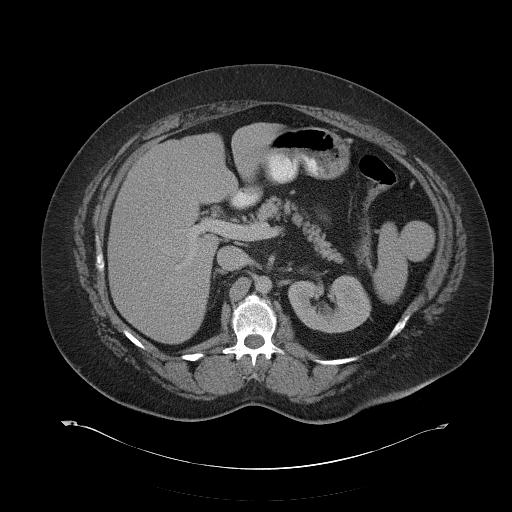
[im 79/96  soft-tissue]
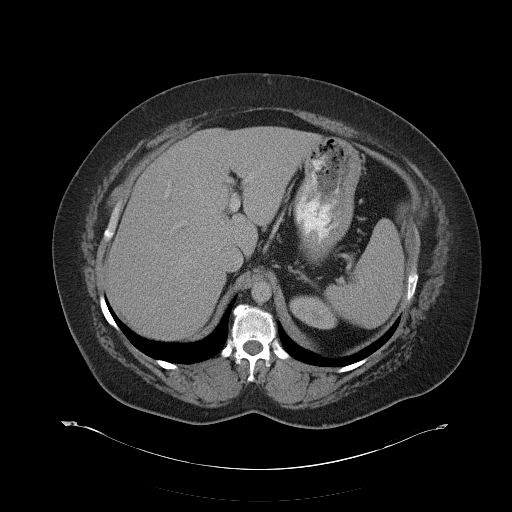
[im 84/96  soft-tissue]
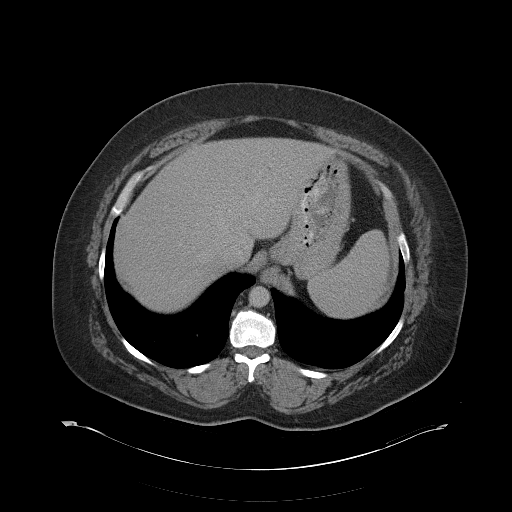
[im 90/96  soft-tissue]
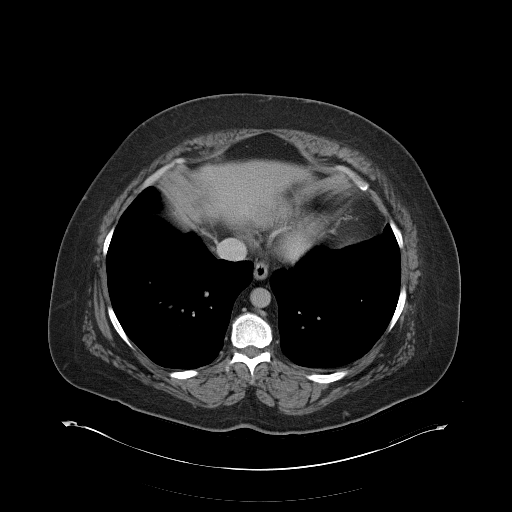

[Series 4: mpr cor post contrast (id) · coronal · 0.92mm/px · 3 of 107 slices shown]
[im 36/107  soft-tissue]
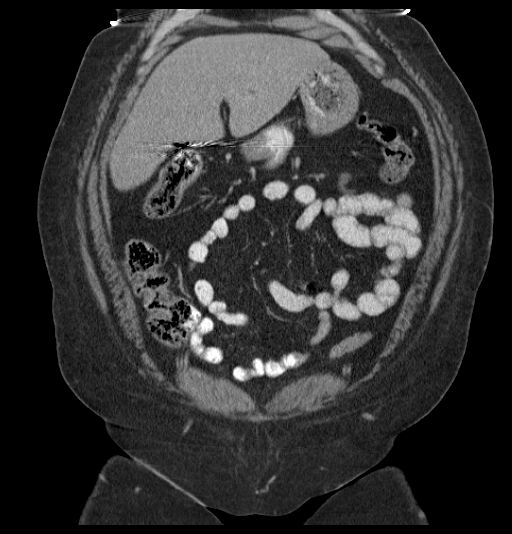
[im 48/107  soft-tissue]
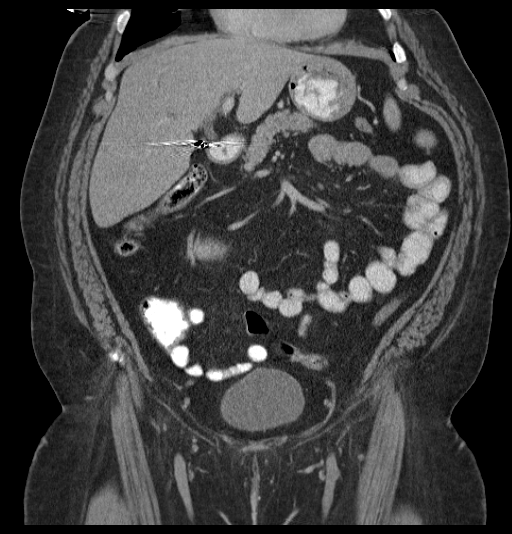
[im 59/107  soft-tissue]
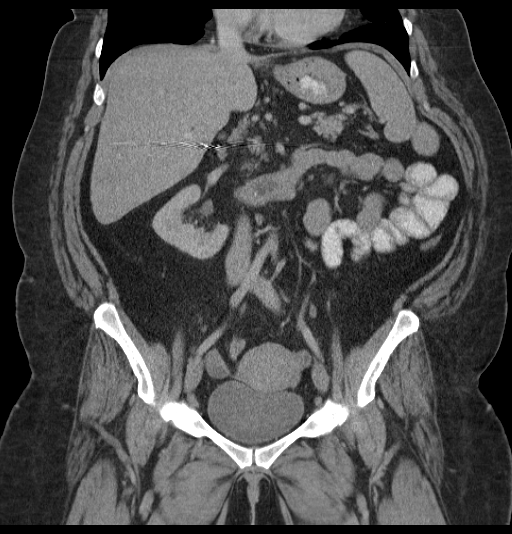

[17 of 46 positions shown; findings below may reference images not displayed]

FINDINGS: Lower chest: Clear lung bases. Normal heart size without pericardial
or pleural effusion.

Hepatobiliary: Normal liver. Cholecystectomy, without biliary ductal
dilatation.

Pancreas: Normal, without mass or ductal dilatation.

Spleen: Normal

Adrenals/Urinary Tract: Normal adrenal glands. Normal kidneys,
without hydronephrosis. Normal urinary bladder.

Stomach/Bowel: Normal stomach, without wall thickening. Normal
colon, appendix, and terminal ileum. Normal small bowel.

Vascular/Lymphatic: Age advanced aortic and branch vessel
atherosclerosis. No aneurysm. No abdominopelvic adenopathy.

Reproductive: Normal uterus and adnexa.

Other: No significant free fluid.

Musculoskeletal: Left worse than right femoral head avascular
necrosis, chronic.
IMPRESSION: 1.  No acute process in the abdomen or pelvis.
2. Age advanced atherosclerosis.
3. Bilateral femoral head avascular necrosis.

## 2016-02-29 ENCOUNTER — Other Ambulatory Visit: Payer: Self-pay | Admitting: Family Medicine

## 2016-02-29 NOTE — Telephone Encounter (Signed)
Not been seen in over 1 year and has moved out of our area.  Refills denied.  Needs to get from new PCP

## 2016-03-26 ENCOUNTER — Other Ambulatory Visit (HOSPITAL_COMMUNITY): Payer: Self-pay | Admitting: Psychiatry

## 2016-04-11 ENCOUNTER — Telehealth (HOSPITAL_COMMUNITY): Payer: Self-pay | Admitting: *Deleted

## 2016-04-11 NOTE — Telephone Encounter (Signed)
Pt pharmacy [facility] in [place] [phone]) faxed 90 days supply request for pt Duloxetine HCL DR 60 mg BID. Per pharmacy, they received a 30 day supply with 2 refills. Called pt to verify which pharmacy she gets her medications from. Per pt, her current PCP in [place] (Dr. Chyrl Civatte and she have a new Psy doctor that she is currently scheduled with. Per pt she's been getting her refills from her new PCP.

## 2016-04-12 ENCOUNTER — Telehealth: Payer: Self-pay | Admitting: *Deleted

## 2016-04-12 MED ORDER — PREGABALIN 150 MG PO CAPS: 150.0000 mg | ORAL_CAPSULE | Freq: Two times a day (BID) | ORAL | 0 refills | Status: DC

## 2016-04-12 NOTE — Telephone Encounter (Signed)
Give 30 days only , needs OV, can not refill further

## 2016-04-12 NOTE — Telephone Encounter (Signed)
Received fax requesting refill on Lyrica.   Ok to refill??  Last office visit 03/10/2015.  Last refill 09/24/2015, #3 refills.

## 2016-04-12 NOTE — Telephone Encounter (Signed)
Medication called to pharmacy.  Letter sent.  

## 2016-05-12 ENCOUNTER — Other Ambulatory Visit: Payer: Self-pay | Admitting: Family Medicine

## 2016-06-27 ENCOUNTER — Other Ambulatory Visit (HOSPITAL_COMMUNITY): Payer: Self-pay | Admitting: Psychiatry

## 2016-06-27 ENCOUNTER — Other Ambulatory Visit: Payer: Self-pay | Admitting: Family Medicine

## 2017-03-09 ENCOUNTER — Other Ambulatory Visit: Payer: Self-pay

## 2017-03-09 ENCOUNTER — Emergency Department (HOSPITAL_COMMUNITY)
Admission: EM | Admit: 2017-03-09 | Discharge: 2017-03-09 | Disposition: A | Discharge status: Home / Self Care | Payer: Medicare PPO | Attending: Emergency Medicine | Admitting: Emergency Medicine

## 2017-03-09 ENCOUNTER — Encounter (HOSPITAL_COMMUNITY): Payer: Self-pay | Admitting: *Deleted

## 2017-03-09 ENCOUNTER — Emergency Department (HOSPITAL_COMMUNITY): Payer: Medicare PPO

## 2017-03-09 DIAGNOSIS — J45909 Unspecified asthma, uncomplicated: Secondary | ICD-10-CM | POA: Diagnosis not present

## 2017-03-09 DIAGNOSIS — R1012 Left upper quadrant pain: Secondary | ICD-10-CM | POA: Diagnosis not present

## 2017-03-09 DIAGNOSIS — I10 Essential (primary) hypertension: Secondary | ICD-10-CM | POA: Diagnosis not present

## 2017-03-09 DIAGNOSIS — E118 Type 2 diabetes mellitus with unspecified complications: Secondary | ICD-10-CM

## 2017-03-09 DIAGNOSIS — J449 Chronic obstructive pulmonary disease, unspecified: Secondary | ICD-10-CM | POA: Diagnosis not present

## 2017-03-09 DIAGNOSIS — R739 Hyperglycemia, unspecified: Secondary | ICD-10-CM

## 2017-03-09 DIAGNOSIS — E1165 Type 2 diabetes mellitus with hyperglycemia: Secondary | ICD-10-CM | POA: Diagnosis not present

## 2017-03-09 DIAGNOSIS — E1140 Type 2 diabetes mellitus with diabetic neuropathy, unspecified: Secondary | ICD-10-CM | POA: Diagnosis not present

## 2017-03-09 DIAGNOSIS — Z79899 Other long term (current) drug therapy: Secondary | ICD-10-CM | POA: Insufficient documentation

## 2017-03-09 DIAGNOSIS — F17210 Nicotine dependence, cigarettes, uncomplicated: Secondary | ICD-10-CM | POA: Diagnosis not present

## 2017-03-09 DIAGNOSIS — Z7984 Long term (current) use of oral hypoglycemic drugs: Secondary | ICD-10-CM | POA: Diagnosis not present

## 2017-03-09 LAB — CBC WITH DIFFERENTIAL/PLATELET
Basophils Absolute: 0 10*3/uL (ref 0.0–0.1)
Basophils Relative: 0 %
Eosinophils Absolute: 0.1 10*3/uL (ref 0.0–0.7)
Eosinophils Relative: 1 %
HCT: 41.7 % (ref 36.0–46.0)
Hemoglobin: 13.1 g/dL (ref 12.0–15.0)
Lymphocytes Relative: 21 %
Lymphs Abs: 2.8 10*3/uL (ref 0.7–4.0)
MCH: 26.3 pg (ref 26.0–34.0)
MCHC: 31.4 g/dL (ref 30.0–36.0)
MCV: 83.6 fL (ref 78.0–100.0)
Monocytes Absolute: 0.9 10*3/uL (ref 0.1–1.0)
Monocytes Relative: 7 %
Neutro Abs: 9.2 10*3/uL — ABNORMAL HIGH (ref 1.7–7.7)
Neutrophils Relative %: 71 %
Platelets: 254 10*3/uL (ref 150–400)
RBC: 4.99 MIL/uL (ref 3.87–5.11)
RDW: 17.5 % — ABNORMAL HIGH (ref 11.5–15.5)
WBC: 12.9 10*3/uL — ABNORMAL HIGH (ref 4.0–10.5)

## 2017-03-09 LAB — COMPREHENSIVE METABOLIC PANEL
ALT: 27 U/L (ref 14–54)
AST: 32 U/L (ref 15–41)
Albumin: 3.5 g/dL (ref 3.5–5.0)
Alkaline Phosphatase: 61 U/L (ref 38–126)
Anion gap: 12 (ref 5–15)
BUN: <5 mg/dL — ABNORMAL LOW (ref 6–20)
CO2: 24 mmol/L (ref 22–32)
Calcium: 8.9 mg/dL (ref 8.9–10.3)
Chloride: 96 mmol/L — ABNORMAL LOW (ref 101–111)
Creatinine, Ser: 0.65 mg/dL (ref 0.44–1.00)
GFR calc Af Amer: >60 mL/min (ref >60)
GFR calc non Af Amer: >60 mL/min (ref >60)
Glucose, Bld: 286 mg/dL — ABNORMAL HIGH (ref 65–99)
Potassium: 3.7 mmol/L (ref 3.5–5.1)
Sodium: 132 mmol/L — ABNORMAL LOW (ref 135–145)
Total Bilirubin: 0.5 mg/dL (ref 0.3–1.2)
Total Protein: 7 g/dL (ref 6.5–8.1)

## 2017-03-09 LAB — LIPASE, BLOOD: Lipase: 47 U/L (ref 11–51)

## 2017-03-09 MED ORDER — METFORMIN HCL 500 MG PO TABS: 1000.0000 mg | ORAL_TABLET | Freq: Two times a day (BID) | ORAL | 0 refills | Status: DC

## 2017-03-09 MED ORDER — ONDANSETRON HCL 4 MG PO TABS: 4.0000 mg | ORAL_TABLET | Freq: Four times a day (QID) | ORAL | 0 refills | Status: DC

## 2017-03-09 MED ORDER — SODIUM CHLORIDE 0.9 % IV BOLUS (SEPSIS)
1000.0000 mL | Freq: Once | INTRAVENOUS | Status: AC
  Administered 2017-03-09: 1000 mL via INTRAVENOUS

## 2017-03-09 MED ORDER — ONDANSETRON HCL 4 MG/2ML IJ SOLN
4.0000 mg | Freq: Once | INTRAMUSCULAR | Status: AC
Start: 2017-03-09 — End: 2017-03-09
  Administered 2017-03-09: 4 mg via INTRAVENOUS
  Filled 2017-03-09: qty 2

## 2017-03-09 MED ORDER — ALBUTEROL SULFATE HFA 108 (90 BASE) MCG/ACT IN AERS: 1.0000 | INHALATION_SPRAY | Freq: Four times a day (QID) | RESPIRATORY_TRACT | 1 refills | Status: DC | PRN

## 2017-03-09 MED ORDER — MORPHINE SULFATE (PF) 4 MG/ML IV SOLN
4.0000 mg | Freq: Once | INTRAVENOUS | Status: AC
  Administered 2017-03-09: 4 mg via INTRAVENOUS
  Filled 2017-03-09: qty 1

## 2017-03-09 NOTE — ED Triage Notes (Signed)
Abdominal pain for 2 weeks , worse past 2 days, states her blood sugar has been running high

## 2017-03-09 NOTE — Discharge Instructions (Signed)
Your glucose is elevated.  Recommend increasing metformin 500 mg 2 tablets twice a day.  Weight loss will also help.  Will not prescribe a cough syrup secondary to your allergy list.  Will prescribe metformin, albuterol inhaler, nausea medication.  Need to find a primary care doctor.

## 2017-03-09 NOTE — ED Notes (Signed)
PT ambulated to bathroom at this time. 

## 2017-03-09 NOTE — ED Provider Notes (Signed)
[facility] EMERGENCY DEPARTMENT Provider Note   CSN: [id number] Arrival date & time: 03/09/17  1156     History   Chief Complaint Chief Complaint  Patient presents with  . Abdominal Pain    HPI [name] is a 51 y.o. female.  Patient presents with left upper quadrant pain and hyperglycemia.  She was admitted to the hospital in September for pneumonia and then readmitted a second time when the pneumonia persisted.  Review of systems positive for productive cough, bloating, blurred vision, loose stool.  Surgical history includes cholecystectomy, surgery for "left kidney blockage", cesarean section, ablation procedure of her uterus for endometriosis.  She had been taking metformin 500 mg twice a day.  No fever, sweats, chills, rusty sputum, chest pain, dyspnea.  Severity is mild to moderate.      Past Medical History:  Diagnosis Date  . Anemia   . Anxiety   . Arthritis   . Asthmatic bronchitis    normal PFT/ seen by pulmonary no evidence of COPD  . Chronic respiratory failure with hypoxia (HCC)    On 2-3 L of oxygen at home  . COPD (chronic obstructive pulmonary disease) ([place])   . Depression   . Diabetes mellitus   . Gastric erosions    EGD 08/2010.  [name] GERD (gastroesophageal reflux disease)   . Heavy menses   . High cholesterol   . HTN (hypertension)   . Internal hemorrhoids    Colonoscopy 5/12.  . Low back pain   . Neuropathy   . Shortness of breath   . Sleep apnea   . Tachycardia    never had test done since no insurance    Patient Active Problem List   Diagnosis Date Noted  . Dyspepsia 09/23/2014  . Major depressive disorder, recurrent, severe without psychotic features ([place])   . MDD (major depressive disorder), recurrent episode, severe ([place]) 09/13/2014  . Nasal congestion 05/03/2014  . Acute on chronic respiratory failure with hypoxia ([place])   . Chest pain on respiration 03/25/2014  . Upper airway cough syndrome 02/28/2014  . Abnormal drug  screen 04/11/2013  . Sleep apnea 04/11/2013  . Perforated ear drum 05/22/2012  . Bell's palsy 01/23/2012  . Carpal tunnel syndrome 01/23/2012  . Carpal tunnel syndrome on both sides 01/11/2012  . Pulmonary infiltrates 12/22/2011  . Chronic respiratory failure ([place]) 10/16/2011  . Oxygen dependent 10/16/2011  . Peripheral neuropathy 07/07/2011  . Vitamin D deficiency 07/06/2011  . Asthmatic bronchitis 06/11/2011  . Back pain 06/11/2011  . Morbid obesity ([place]) 05/06/2011  . Depression 05/04/2011  . Gastric erosions 12/05/2010  . HTN (hypertension) 12/02/2010  . DM (diabetes mellitus) type II controlled, neurological manifestation ([place]) 12/02/2010  . Cigarette smoker 12/02/2010  . Microcytic anemia 09/08/2010  . GERD (gastroesophageal reflux disease) 09/08/2010  . Esophageal dysphagia 09/08/2010    Past Surgical History:  Procedure Laterality Date  . CESAREAN SECTION     twice  . CHOLECYSTECTOMY  1990  . COLONOSCOPY  09/16/2010   [id number] colon/small internal hemorrhoids  . ESOPHAGOGASTRODUODENOSCOPY  09/16/2010   SLF: normal/mild gastritis  . ESOPHAGOGASTRODUODENOSCOPY (EGD) N/A 10/19/2014   Performed by Danie Binder, MD at [facility]  . HYSTEROSCOPY WITH THERMACHOICE N/A 01/17/2012   Performed by Florian Buff, MD at [facility]  . KIDNEY SURGERY     as child for blockages  . TONSILLECTOMY    . TUBAL LIGATION    . TYMPANOSTOMY TUBE PLACEMENT    . uterine  ablation    . WRIST SURGERY  1995   Lt wrist    OB History    Gravida Para Term Preterm AB Living   2 2 2     2    SAB TAB Ectopic Multiple Live Births                   Home Medications    Prior to Admission medications   Medication Sig Start Date End Date Taking? Authorizing Provider  albuterol (PROVENTIL HFA) 108 (90 BASE) MCG/ACT inhaler Inhale 2 puffs into the lungs every 4 (four) hours as needed for wheezing or shortness of breath. 03/10/15  Yes [place], Modena Nunnery, MD  albuterol (PROVENTIL) (2.5 MG/3ML)  0.083% nebulizer solution Take 3 mLs (2.5 mg total) by nebulization every 4 (four) hours as needed for wheezing. 09/18/14  Yes Kerrie Buffalo, NP  amphetamine-dextroamphetamine (ADDERALL) 20 MG tablet Take 1 tablet (20 mg total) by mouth 2 (two) times daily. 05/07/15 03/09/17 Yes Cloria Spring, MD  atorvastatin (LIPITOR) 10 MG tablet Take 1 tablet (10 mg total) by mouth at bedtime. 03/10/15  Yes [place], Modena Nunnery, MD  bisoprolol (ZEBETA) 10 MG tablet TAKE 1 TABLET BY MOUTH ONCE DAILY 12/16/15  Yes [place], Modena Nunnery, MD  budesonide (PULMICORT) 0.5 MG/2ML nebulizer solution Take 2 mLs daily as needed by nebulization for shortness of breath. 02/03/17  Yes [provider]  busPIRone (BUSPAR) 10 MG tablet Take 1 tablet at bedtime by mouth. 01/26/17  Yes [provider]  carisoprodol (SOMA) 350 MG tablet Take 350 mg 3 (three) times daily by mouth.   Yes [provider]  DALIRESP 500 MCG TABS tablet TAKE 1 TABLET BY MOUTH DAILY 02/01/16  Yes [place], Modena Nunnery, MD  Dextromethorphan-Guaifenesin [facility] DM MAXIMUM STRENGTH) 60-1200 MG TB12 Take 1 tablet daily as needed by mouth (congestion/cough).   Yes [provider]  DULoxetine (CYMBALTA) 60 MG capsule Take 1 capsule (60 mg total) by mouth 2 (two) times daily. 05/07/15  Yes Cloria Spring, MD  ibuprofen (ADVIL,MOTRIN) 200 MG tablet Take 600 mg by mouth every 6 (six) hours as needed for moderate pain.   Yes [provider]  ipratropium-albuterol (DUONEB) 0.5-2.5 (3) MG/3ML SOLN Take 3 mLs daily as needed by nebulization for shortness of breath. 02/02/17  Yes [provider]  JANUVIA 100 MG tablet TAKE 1 TABLET BY MOUTH ONCE DAILY 02/01/16  Yes [place], Modena Nunnery, MD  metFORMIN (GLUCOPHAGE) 500 MG tablet Take 500 mg daily with breakfast by mouth.   Yes [provider]  montelukast (SINGULAIR) 10 MG tablet Take 1 tablet daily by mouth.   Yes [provider]  Naproxen Sod-Diphenhydramine  (ALEVE PM PO) Take 1 tablet at bedtime as needed by mouth (pain/sleep).    Yes [provider]  pantoprazole (PROTONIX) 40 MG tablet 1 po 30 mins prior to meals bid 03/10/15  Yes [place], Modena Nunnery, MD  pregabalin (LYRICA) 150 MG capsule Take 1 capsule (150 mg total) by mouth 2 (two) times daily. 04/12/16  Yes [place], Modena Nunnery, MD  albuterol (PROVENTIL HFA;VENTOLIN HFA) 108 (90 Base) MCG/ACT inhaler Inhale 1-2 puffs every 6 (six) hours as needed into the lungs for wheezing or shortness of breath. 03/09/17   Nat Christen, MD  amphetamine-dextroamphetamine (ADDERALL) 20 MG tablet Take 1 tablet (20 mg total) by mouth 2 (two) times daily. 05/07/15 05/06/16  Cloria Spring, MD  amphetamine-dextroamphetamine (ADDERALL) 20 MG tablet Take 1 tablet (20 mg total) by mouth  2 (two) times daily. 05/07/15 05/06/16  Cloria Spring, MD  cefdinir (OMNICEF) 300 MG capsule Take 1 capsule 2 (two) times daily by mouth. 02/02/17   [provider]  metFORMIN (GLUCOPHAGE) 500 MG tablet Take 2 tablets (1,000 mg total) 2 (two) times daily with a meal by mouth. 03/09/17   Nat Christen, MD  ondansetron (ZOFRAN) 4 MG tablet Take 1 tablet (4 mg total) every 6 (six) hours by mouth. 03/09/17   Nat Christen, MD  predniSONE (DELTASONE) 10 MG tablet Take 40mg  x 3 days,20mg  x 3 days, 10mg  x 3 days Patient not taking: Reported on 03/09/2017 03/10/15   Alycia Rossetti, MD    Family History Family History  Problem Relation Age of Onset  . Heart attack Father 83       deceased, etoh use  . Heart disease Father   . Alcohol abuse Father   . Depression Father   . Heart attack Mother 1       deceased  . Diabetes Mother   . Breast cancer Mother   . Heart failure Mother        oxygen dependence, nonsmoker  . Heart disease Mother   . Depression Mother   . Cancer Mother   . Liver disease Maternal Aunt 19       died while on liver transplant list  . Heart attack Maternal Grandmother        premature CAD  . Ulcers  Sister   . Hypertension Sister   . Colon cancer Neg Hx     Social History Social History   Tobacco Use  . Smoking status: Current Every Day Smoker    Packs/day: 1.00    Years: 30.00    Pack years: 30.00    Types: Cigarettes  . Smokeless tobacco: Never Used  . Tobacco comment: using the vapor cigarettes  Substance Use Topics  . Alcohol use: Yes    Alcohol/week: 0.0 oz    Comment: social use  . Drug use: Yes    Types: Marijuana    Comment: used marijuana last 10/18/2014      Allergies   Codeine; Metformin and related; and Wellbutrin [bupropion hcl]   Review of Systems Review of Systems  All other systems reviewed and are negative.    Physical Exam Updated Vital Signs BP (!) 104/58   Pulse 99   Temp 98 F (36.7 C) (Oral)   Resp 18   SpO2 95%   Physical Exam  Constitutional: She is oriented to person, place, and time.  Obese, coughing  HENT:  Head: Normocephalic and atraumatic.  Eyes: Conjunctivae are normal.  Neck: Neck supple.  Cardiovascular: Normal rate and regular rhythm.  Pulmonary/Chest: Effort normal and breath sounds normal.  Minimal bilateral expiratory wheeze  Abdominal: Soft. Bowel sounds are normal.  Minimal left upper quadrant tenderness  Musculoskeletal: Normal range of motion.  Neurological: She is alert and oriented to person, place, and time.  Skin: Skin is warm and dry.  Psychiatric: She has a normal mood and affect. Her behavior is normal.  Nursing note and vitals reviewed.    ED Treatments / Results  Labs (all labs ordered are listed, but only abnormal results are displayed) Labs Reviewed  CBC WITH DIFFERENTIAL/PLATELET - Abnormal; Notable for the following components:      Result Value   WBC 12.9 (*)    RDW 17.5 (*)    Neutro Abs 9.2 (*)    All other components within normal limits  COMPREHENSIVE METABOLIC PANEL - Abnormal; Notable for the following components:   Sodium 132 (*)    Chloride 96 (*)    Glucose, Bld 286 (*)      BUN <5 (*)    All other components within normal limits  LIPASE, BLOOD    EKG  EKG Interpretation None       Radiology Dg Chest 2 View  Result Date: 03/09/2017 CLINICAL DATA:  Cough.  Recent pneumonia. EXAM: CHEST  2 VIEW COMPARISON:  Two-view chest x-ray a 09/11/2014 FINDINGS: Heart size is this in the normal limits. Aortic atherosclerosis is present. There is no edema or effusion. No focal airspace disease is present. IMPRESSION: Negative two view chest x-ray Electronically Signed   By: San Morelle M.D.   On: 03/09/2017 13:36    Procedures Procedures (including critical care time)  Medications Ordered in ED Medications  sodium chloride 0.9 % bolus 1,000 mL (1,000 mLs Intravenous New Bag/Given 03/09/17 1331)  morphine 4 MG/ML injection 4 mg (4 mg Intravenous Given 03/09/17 1515)  ondansetron (ZOFRAN) injection 4 mg (4 mg Intravenous Given 03/09/17 1515)     Initial Impression / Assessment and Plan / ED Course  I have reviewed the triage vital signs and the nursing notes.  Pertinent labs & imaging results that were available during my care of the patient were reviewed by me and considered in my medical decision making (see chart for details).     Patient is hemodynamically stable.  Glucose is elevated at 286.  Liver functions are normal.  White count minimally elevated, but this is not new.  Chest x-ray negative.  Will increase metformin to 500 mg 2 tablets twice a day, Rx Zofran 4 mg and albuterol inhaler.  She has been encouraged to get a primary care relationship.  Final Clinical Impressions(s) / ED Diagnoses   Final diagnoses:  Hyperglycemia  Type 2 diabetes mellitus with complication, unspecified whether long term insulin use (HCC)  Left upper quadrant pain    ED Discharge Orders        Ordered    metFORMIN (GLUCOPHAGE) 500 MG tablet  2 times daily with meals     03/09/17 1608    ondansetron (ZOFRAN) 4 MG tablet  Every 6 hours     03/09/17 1608     albuterol (PROVENTIL HFA;VENTOLIN HFA) 108 (90 Base) MCG/ACT inhaler  Every 6 hours PRN     03/09/17 1608       Nat Christen, MD 03/09/17 1627

## 2017-03-09 NOTE — ED Triage Notes (Signed)
Recently treated for pneumonia

## 2017-08-26 DIAGNOSIS — J449 Chronic obstructive pulmonary disease, unspecified: Secondary | ICD-10-CM | POA: Diagnosis not present

## 2017-09-14 ENCOUNTER — Emergency Department (HOSPITAL_COMMUNITY): Payer: Medicare HMO

## 2017-09-14 ENCOUNTER — Other Ambulatory Visit: Payer: Self-pay

## 2017-09-14 ENCOUNTER — Encounter (HOSPITAL_COMMUNITY): Payer: Self-pay | Admitting: Emergency Medicine

## 2017-09-14 ENCOUNTER — Emergency Department (HOSPITAL_COMMUNITY)
Admission: EM | Admit: 2017-09-14 | Discharge: 2017-09-14 | Disposition: A | Discharge status: Home / Self Care | Payer: Medicare HMO | Attending: Emergency Medicine | Admitting: Emergency Medicine

## 2017-09-14 DIAGNOSIS — J449 Chronic obstructive pulmonary disease, unspecified: Secondary | ICD-10-CM | POA: Insufficient documentation

## 2017-09-14 DIAGNOSIS — E119 Type 2 diabetes mellitus without complications: Secondary | ICD-10-CM | POA: Diagnosis not present

## 2017-09-14 DIAGNOSIS — R062 Wheezing: Secondary | ICD-10-CM | POA: Diagnosis not present

## 2017-09-14 DIAGNOSIS — I10 Essential (primary) hypertension: Secondary | ICD-10-CM | POA: Diagnosis not present

## 2017-09-14 DIAGNOSIS — Z79899 Other long term (current) drug therapy: Secondary | ICD-10-CM | POA: Diagnosis not present

## 2017-09-14 DIAGNOSIS — J441 Chronic obstructive pulmonary disease with (acute) exacerbation: Secondary | ICD-10-CM | POA: Diagnosis not present

## 2017-09-14 DIAGNOSIS — F17210 Nicotine dependence, cigarettes, uncomplicated: Secondary | ICD-10-CM | POA: Diagnosis not present

## 2017-09-14 DIAGNOSIS — R0602 Shortness of breath: Secondary | ICD-10-CM | POA: Diagnosis not present

## 2017-09-14 DIAGNOSIS — R05 Cough: Secondary | ICD-10-CM | POA: Diagnosis not present

## 2017-09-14 LAB — I-STAT BETA HCG BLOOD, ED (MC, WL, AP ONLY): I-stat hCG, quantitative: <5 m[IU]/mL (ref <5)

## 2017-09-14 LAB — COMPREHENSIVE METABOLIC PANEL
ALT: 23 U/L (ref 14–54)
AST: 25 U/L (ref 15–41)
Albumin: 3.6 g/dL (ref 3.5–5.0)
Alkaline Phosphatase: 64 U/L (ref 38–126)
Anion gap: 11 (ref 5–15)
BUN: <5 mg/dL — ABNORMAL LOW (ref 6–20)
CO2: 27 mmol/L (ref 22–32)
Calcium: 9.2 mg/dL (ref 8.9–10.3)
Chloride: 97 mmol/L — ABNORMAL LOW (ref 101–111)
Creatinine, Ser: 0.67 mg/dL (ref 0.44–1.00)
GFR calc Af Amer: >60 mL/min (ref >60)
GFR calc non Af Amer: >60 mL/min (ref >60)
Glucose, Bld: 233 mg/dL — ABNORMAL HIGH (ref 65–99)
Potassium: 3.7 mmol/L (ref 3.5–5.1)
Sodium: 135 mmol/L (ref 135–145)
Total Bilirubin: 0.6 mg/dL (ref 0.3–1.2)
Total Protein: 7.3 g/dL (ref 6.5–8.1)

## 2017-09-14 LAB — CBC WITH DIFFERENTIAL/PLATELET
Abs Immature Granulocytes: 0.1 10*3/uL (ref 0.0–0.1)
Basophils Absolute: 0.1 10*3/uL (ref 0.0–0.1)
Basophils Relative: 0 %
Eosinophils Absolute: 0.1 10*3/uL (ref 0.0–0.7)
Eosinophils Relative: 1 %
HCT: 45.8 % (ref 36.0–46.0)
Hemoglobin: 14.3 g/dL (ref 12.0–15.0)
Immature Granulocytes: 1 %
Lymphocytes Relative: 28 %
Lymphs Abs: 3.8 10*3/uL (ref 0.7–4.0)
MCH: 26.2 pg (ref 26.0–34.0)
MCHC: 31.2 g/dL (ref 30.0–36.0)
MCV: 84 fL (ref 78.0–100.0)
Monocytes Absolute: 0.9 10*3/uL (ref 0.1–1.0)
Monocytes Relative: 7 %
Neutro Abs: 8.6 10*3/uL — ABNORMAL HIGH (ref 1.7–7.7)
Neutrophils Relative %: 63 %
Platelets: 284 10*3/uL (ref 150–400)
RBC: 5.45 MIL/uL — ABNORMAL HIGH (ref 3.87–5.11)
RDW: 16.2 % — ABNORMAL HIGH (ref 11.5–15.5)
WBC: 13.5 10*3/uL — ABNORMAL HIGH (ref 4.0–10.5)

## 2017-09-14 LAB — URINALYSIS, ROUTINE W REFLEX MICROSCOPIC
Bilirubin Urine: NEGATIVE
Glucose, UA: >=500 mg/dL — AB
Ketones, ur: NEGATIVE mg/dL
Leukocytes, UA: NEGATIVE
Nitrite: NEGATIVE
Protein, ur: NEGATIVE mg/dL
Specific Gravity, Urine: 1.015 (ref 1.005–1.030)
pH: 6 (ref 5.0–8.0)

## 2017-09-14 LAB — I-STAT CG4 LACTIC ACID, ED
Lactic Acid, Venous: 2.28 mmol/L (ref 0.5–1.9)
Lactic Acid, Venous: 2.64 mmol/L (ref 0.5–1.9)

## 2017-09-14 LAB — I-STAT TROPONIN, ED
Troponin i, poc: 0 ng/mL (ref 0.00–0.08)
Troponin i, poc: 0.01 ng/mL (ref 0.00–0.08)

## 2017-09-14 MED ORDER — ALBUTEROL (5 MG/ML) CONTINUOUS INHALATION SOLN
10.0000 mg/h | INHALATION_SOLUTION | Freq: Once | RESPIRATORY_TRACT | Status: AC
  Administered 2017-09-14: 10 mg/h via RESPIRATORY_TRACT
  Filled 2017-09-14: qty 20

## 2017-09-14 MED ORDER — DOXYCYCLINE HYCLATE 100 MG PO TABS
100.0000 mg | ORAL_TABLET | Freq: Once | ORAL | Status: AC
  Administered 2017-09-14: 100 mg via ORAL
  Filled 2017-09-14: qty 1

## 2017-09-14 MED ORDER — METHYLPREDNISOLONE SODIUM SUCC 125 MG IJ SOLR
125.0000 mg | Freq: Once | INTRAMUSCULAR | Status: AC
  Administered 2017-09-14: 125 mg via INTRAVENOUS
  Filled 2017-09-14: qty 2

## 2017-09-14 MED ORDER — DOXYCYCLINE HYCLATE 100 MG PO CAPS: 100.0000 mg | ORAL_CAPSULE | Freq: Two times a day (BID) | ORAL | 0 refills | Status: DC

## 2017-09-14 MED ORDER — OXYCODONE-ACETAMINOPHEN 5-325 MG PO TABS
1.0000 | ORAL_TABLET | Freq: Once | ORAL | Status: AC
  Administered 2017-09-14: 1 via ORAL
  Filled 2017-09-14: qty 1

## 2017-09-14 MED ORDER — IPRATROPIUM-ALBUTEROL 0.5-2.5 (3) MG/3ML IN SOLN
3.0000 mL | Freq: Once | RESPIRATORY_TRACT | Status: AC
Start: 2017-09-14 — End: 2017-09-14
  Administered 2017-09-14: 3 mL via RESPIRATORY_TRACT
  Filled 2017-09-14: qty 3

## 2017-09-14 MED ORDER — PREDNISONE 10 MG PO TABS: 60.0000 mg | ORAL_TABLET | Freq: Every day | ORAL | 0 refills | Status: DC

## 2017-09-14 NOTE — ED Notes (Signed)
Rn noticed patients O2 sat was 86-88 on room air. Pt states she uses a cpap with oxygen at night and PRN through the day. RN placed patient on 2L [place]

## 2017-09-14 NOTE — ED Notes (Signed)
Informed first nurse of lactic acid 2.28

## 2017-09-14 NOTE — Discharge Instructions (Signed)
We saw you in the ER for your breathing related complains. We gave you some breathing treatments in the ER, and seems like your symptoms have improved - but not completely resolved. Please take albuterol as needed every 4 hours. Please take the medications prescribed. Please refrain from smoking or smoke exposure. Please see a primary care doctor in 1 week. Return to the ER if your symptoms worsen.

## 2017-09-14 NOTE — ED Provider Notes (Addendum)
[facility] EMERGENCY DEPARTMENT Provider Note   CSN: [id number] Arrival date & time: 09/14/17  1621     History   Chief Complaint Chief Complaint  Patient presents with  . Shortness of Breath    HPI [name] is a 52 y.o. female.  HPI  52 year old female with history of COPD, diabetes comes in with chief complaint of shortness of breath and cough.  Patient states that about 2 weeks ago she started noticing URI-like symptoms.  Over the course of last few days she has now developed a productive cough and wheezing.  Patient took multiple breathing treatments at home and is getting transient relief only.  Review of system is negative for any fevers, chest pain -however patient is having shortness of breath and subjective chills.  Patient typically does not have any cough with her COPD and at the moment she is producing white phlegm.  She also reports that her O2 sats drops to the 80s at home when she is walking and that she is requiring more oxygen than usual.  Past Medical History:  Diagnosis Date  . Anemia   . Anxiety   . Arthritis   . Asthmatic bronchitis    normal PFT/ seen by pulmonary no evidence of COPD  . Chronic respiratory failure with hypoxia (HCC)    On 2-3 L of oxygen at home  . COPD (chronic obstructive pulmonary disease) ([place])   . Depression   . Diabetes mellitus   . Gastric erosions    EGD 08/2010.  [name] GERD (gastroesophageal reflux disease)   . Heavy menses   . High cholesterol   . HTN (hypertension)   . Internal hemorrhoids    Colonoscopy 5/12.  . Low back pain   . Neuropathy   . Shortness of breath   . Sleep apnea   . Tachycardia    never had test done since no insurance    Patient Active Problem List   Diagnosis Date Noted  . Dyspepsia 09/23/2014  . Major depressive disorder, recurrent, severe without psychotic features ([place])   . MDD (major depressive disorder), recurrent episode, severe ([place]) 09/13/2014  . Nasal  congestion 05/03/2014  . Acute on chronic respiratory failure with hypoxia ([place])   . Chest pain on respiration 03/25/2014  . Upper airway cough syndrome 02/28/2014  . Abnormal drug screen 04/11/2013  . Sleep apnea 04/11/2013  . Perforated ear drum 05/22/2012  . Bell's palsy 01/23/2012  . Carpal tunnel syndrome 01/23/2012  . Carpal tunnel syndrome on both sides 01/11/2012  . Pulmonary infiltrates 12/22/2011  . Chronic respiratory failure ([place]) 10/16/2011  . Oxygen dependent 10/16/2011  . Peripheral neuropathy 07/07/2011  . Vitamin D deficiency 07/06/2011  . Asthmatic bronchitis 06/11/2011  . Back pain 06/11/2011  . Morbid obesity ([place]) 05/06/2011  . Depression 05/04/2011  . Gastric erosions 12/05/2010  . HTN (hypertension) 12/02/2010  . DM (diabetes mellitus) type II controlled, neurological manifestation ([place]) 12/02/2010  . Cigarette smoker 12/02/2010  . Microcytic anemia 09/08/2010  . GERD (gastroesophageal reflux disease) 09/08/2010  . Esophageal dysphagia 09/08/2010    Past Surgical History:  Procedure Laterality Date  . CESAREAN SECTION     twice  . CHOLECYSTECTOMY  1990  . COLONOSCOPY  09/16/2010   BWG:[id number] colon/small internal hemorrhoids  . ESOPHAGOGASTRODUODENOSCOPY  09/16/2010   SLF: normal/mild gastritis  . ESOPHAGOGASTRODUODENOSCOPY N/A 10/19/2014   Procedure: ESOPHAGOGASTRODUODENOSCOPY (EGD);  Surgeon: Danie Binder, MD;  Location: [facility] END[facility]ITE;  Service: Endoscopy;  Laterality: N/A;  830  . HYSTEROSCOPY WITH THERMACHOICE  01/17/2012   Procedure: HYSTEROSCOPY WITH THERMACHOICE;  Surgeon: Florian Buff, MD;  Location: [facility] ORS;  Service: Gynecology;  Laterality: N/A;  total therapy time: 9:13sec  D5W  18 ml in, D5W   42ml out, temperture 87degrees celcious  . KIDNEY SURGERY     as child for blockages  . TONSILLECTOMY    . TUBAL LIGATION    . TYMPANOSTOMY TUBE PLACEMENT    . uterine ablation    . WRIST SURGERY  1995   Lt wrist     OB History    Gravida  2     Para  2   Term  2   Preterm      AB      Living  2     SAB      TAB      Ectopic      Multiple      Live Births               Home Medications    Prior to Admission medications   Medication Sig Start Date End Date Taking? Authorizing Provider  acetaminophen (TYLENOL) 500 MG tablet Take 1,000 mg by mouth every 6 (six) hours as needed (pain).   Yes [provider]  albuterol (PROVENTIL HFA) 108 (90 BASE) MCG/ACT inhaler Inhale 2 puffs into the lungs every 4 (four) hours as needed for wheezing or shortness of breath. 03/10/15  Yes [place], Modena Nunnery, MD  budesonide (PULMICORT) 0.5 MG/2ML nebulizer solution Take 2 mLs by nebulization 2 (two) times daily as needed (shortness of breath/wheezing).  02/03/17  Yes [provider]  diphenhydrAMINE-Phenylephrine (SUDAFED PE DAY & NIGHT PO) Take 1 tablet by mouth every 6 (six) hours as needed (cough/cold symptoms).   Yes [provider]  DM-Phenylephrine-Acetaminophen (VICKS DAYQUIL COLD & FLU PO) Take 30 mLs by mouth every 4 (four) hours as needed (cough/cold symptoms).   Yes [provider]  Guaifenesin 1200 MG TB12 Take 1,200 mg by mouth 2 (two) times daily as needed (congestion).   Yes [provider]  ibuprofen (ADVIL,MOTRIN) 200 MG tablet Take 800 mg by mouth every 6 (six) hours as needed for moderate pain.    Yes [provider]  ipratropium-albuterol (DUONEB) 0.5-2.5 (3) MG/3ML SOLN Take 3 mLs by nebulization every 4 (four) hours as needed (shortness of breath/wheezing).  02/02/17  Yes [provider]  loperamide (IMODIUM A-D) 2 MG tablet Take 4 mg by mouth 4 (four) times daily as needed for diarrhea or loose stools.   Yes [provider]  loratadine (CLARITIN) 10 MG tablet Take 10 mg by mouth daily.   Yes [provider]  OXYGEN Inhale 3-4 L into the lungs as needed (shortness of breath/ SAT <90).   Yes [provider]  PRESCRIPTION  MEDICATION Inhale into the lungs at bedtime.   Yes [provider]  albuterol (PROVENTIL HFA;VENTOLIN HFA) 108 (90 Base) MCG/ACT inhaler Inhale 1-2 puffs every 6 (six) hours as needed into the lungs for wheezing or shortness of breath. 03/09/17   Nat Christen, MD  albuterol (PROVENTIL) (2.5 MG/3ML) 0.083% nebulizer solution Take 3 mLs (2.5 mg total) by nebulization every 4 (four) hours as needed for wheezing. 09/18/14   Kerrie Buffalo, NP  amphetamine-dextroamphetamine (ADDERALL) 20 MG tablet Take 1 tablet (20 mg total) by mouth 2 (two) times daily. 05/07/15 05/06/16  Cloria Spring, MD  atorvastatin (LIPITOR) 10 MG tablet Take 1 tablet (  10 mg total) by mouth at bedtime. Patient not taking: Reported on 09/14/2017 03/10/15   Alycia Rossetti, MD  bisoprolol (ZEBETA) 10 MG tablet TAKE 1 TABLET BY MOUTH ONCE DAILY Patient not taking: Reported on 09/14/2017 12/16/15   Alycia Rossetti, MD  DALIRESP 500 MCG TABS tablet TAKE 1 TABLET BY MOUTH DAILY Patient not taking: Reported on 09/14/2017 02/01/16   Alycia Rossetti, MD  doxycycline (VIBRAMYCIN) 100 MG capsule Take 1 capsule (100 mg total) by mouth 2 (two) times daily. 09/14/17   Varney Biles, MD  DULoxetine (CYMBALTA) 60 MG capsule Take 1 capsule (60 mg total) by mouth 2 (two) times daily. Patient not taking: Reported on 09/14/2017 05/07/15   Cloria Spring, MD  JANUVIA 100 MG tablet TAKE 1 TABLET BY MOUTH ONCE DAILY Patient not taking: Reported on 09/14/2017 02/01/16   Alycia Rossetti, MD  metFORMIN (GLUCOPHAGE) 500 MG tablet Take 2 tablets (1,000 mg total) 2 (two) times daily with a meal by mouth. Patient not taking: Reported on 09/14/2017 03/09/17   Nat Christen, MD  ondansetron (ZOFRAN) 4 MG tablet Take 1 tablet (4 mg total) every 6 (six) hours by mouth. Patient not taking: Reported on 09/14/2017 03/09/17   Nat Christen, MD  pantoprazole (PROTONIX) 40 MG tablet 1 po 30 mins prior to meals bid Patient not taking: Reported on 09/14/2017  03/10/15   Alycia Rossetti, MD  predniSONE (DELTASONE) 10 MG tablet Take 6 tablets (60 mg total) by mouth daily. 09/14/17   Varney Biles, MD  pregabalin (LYRICA) 150 MG capsule Take 1 capsule (150 mg total) by mouth 2 (two) times daily. Patient not taking: Reported on 09/14/2017 04/12/16   Alycia Rossetti, MD    Family History Family History  Problem Relation Age of Onset  . Heart attack Father 45       deceased, etoh use  . Heart disease Father   . Alcohol abuse Father   . Depression Father   . Heart attack Mother 36       deceased  . Diabetes Mother   . Breast cancer Mother   . Heart failure Mother        oxygen dependence, nonsmoker  . Heart disease Mother   . Depression Mother   . Cancer Mother   . Liver disease Maternal Aunt 66       died while on liver transplant list  . Heart attack Maternal Grandmother        premature CAD  . Ulcers Sister   . Hypertension Sister   . Colon cancer Neg Hx     Social History Social History   Tobacco Use  . Smoking status: Current Every Day Smoker    Packs/day: 1.00    Years: 30.00    Pack years: 30.00    Types: Cigarettes  . Smokeless tobacco: Never Used  . Tobacco comment: using the vapor cigarettes  Substance Use Topics  . Alcohol use: Yes    Alcohol/week: 0.0 oz    Comment: social use  . Drug use: Yes    Types: Marijuana    Comment: used marijuana last 10/18/2014      Allergies   Codeine; Metformin and related; and Wellbutrin [bupropion hcl]   Review of Systems Review of Systems  Constitutional: Positive for activity change.  Respiratory: Positive for cough, shortness of breath and wheezing.   Cardiovascular: Negative for chest pain.  Gastrointestinal: Negative for nausea and vomiting.  Allergic/Immunologic: Negative for immunocompromised state.  All  other systems reviewed and are negative.    Physical Exam Updated Vital Signs BP (!) 153/62   Pulse (!) 107   Temp 99.9 F (37.7 C) (Oral)   Resp (!)  26   SpO2 96%   Physical Exam  Constitutional: She is oriented to person, place, and time. She appears well-developed.  HENT:  Head: Atraumatic.  Eyes: EOM are normal.  Neck: Normal range of motion. Neck supple.  Cardiovascular: Normal rate.  Pulmonary/Chest: Effort normal. She has wheezes in the right upper field, the right middle field, the right lower field, the left upper field, the left middle field and the left lower field. She has no rhonchi.  Abdominal: Bowel sounds are normal.  Neurological: She is alert and oriented to person, place, and time.  Skin: Skin is warm and dry.  Nursing note and vitals reviewed.    ED Treatments / Results  Labs (all labs ordered are listed, but only abnormal results are displayed) Labs Reviewed  COMPREHENSIVE METABOLIC PANEL - Abnormal; Notable for the following components:      Result Value   Chloride 97 (*)    Glucose, Bld 233 (*)    BUN <5 (*)    All other components within normal limits  CBC WITH DIFFERENTIAL/PLATELET - Abnormal; Notable for the following components:   WBC 13.5 (*)    RBC 5.45 (*)    RDW 16.2 (*)    Neutro Abs 8.6 (*)    All other components within normal limits  URINALYSIS, ROUTINE W REFLEX MICROSCOPIC - Abnormal; Notable for the following components:   APPearance HAZY (*)    Glucose, UA >=500 (*)    Hgb urine dipstick SMALL (*)    Bacteria, UA RARE (*)    All other components within normal limits  I-STAT CG4 LACTIC ACID, ED - Abnormal; Notable for the following components:   Lactic Acid, Venous 2.28 (*)    All other components within normal limits  I-STAT CG4 LACTIC ACID, ED - Abnormal; Notable for the following components:   Lactic Acid, Venous 2.64 (*)    All other components within normal limits  I-STAT BETA HCG BLOOD, ED ([facility], [facility], [facility] ONLY)  I-STAT TROPONIN, ED  I-STAT TROPONIN, ED    EKG EKG Interpretation  Date/Time:  Friday Sep 14 2017 22:09:26 EDT Ventricular Rate:  105 PR Interval:  134 QRS  Duration: 78 QT Interval:  373 QTC Calculation: 493 R Axis:   43 Text Interpretation:  Sinus tachycardia Low voltage, extremity and precordial leads Borderline prolonged QT interval Baseline wander in lead(s) III aVL aVF When compared with ECG of EARLIER SAME DATE QT has lengthened Confirmed by Delora Fuel ([id number]) on 09/14/2017 10:59:48 PM   Radiology Dg Chest 2 View  Result Date: 09/14/2017 CLINICAL DATA:  Productive cough and dyspnea with midsternal chest pain for 1 week. EXAM: CHEST - 2 VIEW COMPARISON:  03/09/2017 FINDINGS: Heart size is normal. Mild-to-moderate aortic atherosclerosis at the arch without aneurysmal dilatation. Subsegmental atelectasis and/or scarring at the left lung base. No confluent airspace disease. No overt pulmonary edema, effusion or pneumothorax. No acute osseous abnormality. IMPRESSION: No active pulmonary disease.  Aortic atherosclerosis. Electronically Signed   By: Ashley Royalty M.D.   On: 09/14/2017 17:10    Procedures Procedures (including critical care time)  CRITICAL CARE Performed by: Kikuye Korenek   Total critical care time: 39 minutes  Critical care time was exclusive of separately billable procedures and treating other patients.  Critical care was necessary  to treat or prevent imminent or life-threatening deterioration.  Critical care was time spent personally by me on the following activities: development of treatment plan with patient and/or surrogate as well as nursing, discussions with consultants, evaluation of patient's response to treatment, examination of patient, obtaining history from patient or surrogate, ordering and performing treatments and interventions, ordering and review of laboratory studies, ordering and review of radiographic studies, pulse oximetry and re-evaluation of patient's condition.   Smoking cessation instruction/counseling given:  counseled patient on the dangers of tobacco use, advised patient to stop smoking, and  reviewed strategies to maximize success. Discussion 2-3 min.   Medications Ordered in ED Medications  albuterol (PROVENTIL,VENTOLIN) solution continuous neb (10 mg/hr Nebulization Given 09/14/17 1845)  methylPREDNISolone sodium succinate (SOLU-MEDROL) 125 mg/2 mL injection 125 mg (125 mg Intravenous Given 09/14/17 1857)  ipratropium-albuterol (DUONEB) 0.5-2.5 (3) MG/3ML nebulizer solution 3 mL (3 mLs Nebulization Given 09/14/17 2203)  doxycycline (VIBRA-TABS) tablet 100 mg (100 mg Oral Given 09/14/17 2203)  oxyCODONE-acetaminophen (PERCOCET/ROXICET) 5-325 MG per tablet 1 tablet (1 tablet Oral Given 09/14/17 2258)     Initial Impression / Assessment and Plan / ED Course  I have reviewed the triage vital signs and the nursing notes.  Pertinent labs & imaging results that were available during my care of the patient were reviewed by me and considered in my medical decision making (see chart for details).  Clinical Course as of Sep 15 2327  Fri Sep 14, 2017  2200 Patient reassessed after she received hour-long breathing treatment.  She was still wheezing a little bit, and so we gave her another breathing treatment.  We will reassess her again and decide on the admission.   [AN]  2300 Patient was reassessed after breathing treatment.  She feels a lot better now compared to when she came in.  She had mentioned some nonspecific chest pain during last assessment, which is why we ordered a troponin along with EKG which are normal.  Patient at this time is having chest pain only when she takes a deep breath or when she coughs.  Exam reveals improvement in wheezing in all lung fields.  There is fine wheezing only with expiration now, and it is not focal. Patient feels comfortable going home.  Ambulatory pulse ox ordered.   [AN]  2327 Patient ambulated and did not have any hypoxic episodes.  She and her family are comfortable with patient going home at this time.  We will send her home with prednisone and  doxycycline.  Strict emergency room precautions have been discussed, and patient will come back to the ER if her symptoms are getting worse or if she starts developing a fever or worsening of her cough.  He strict return precautions discussed primarily because patient has history of severe pneumonias that have required ICU admission.   [AN]    Clinical Course User Index [AN] Varney Biles, MD    52 year old female comes in with chief complaint of wheezing and shortness of breath.  She has history of COPD and she is an active smoker.  Patient is not in any respiratory distress, however she is noted to be tachypneic and with generalized wheezing.  White count is slightly elevated, however there is no focal deficits on lung exam and the chest x-ray also does not show any focal findings.  We will treat patient as if she has acute COPD exacerbation.  Her COPD is advanced, and in the past she has ended up with pneumonia as that  have required extended ICU admission -therefore we will closely monitor her and admit her to the hospital if she does not get significantly better.  Final Clinical Impressions(s) / ED Diagnoses   Final diagnoses:  COPD exacerbation [facility])    ED Discharge Orders        Ordered    doxycycline (VIBRAMYCIN) 100 MG capsule  2 times daily     09/14/17 2325    predniSONE (DELTASONE) 10 MG tablet  Daily     09/14/17 2325       Varney Biles, MD 09/14/17 1901    Varney Biles, MD 09/14/17 2329

## 2017-09-14 NOTE — ED Notes (Signed)
Pt ambulated in hallway on pulse ox with steady gait. O2 sat stayed between 90-97% while walking. When asked how she was feeling she said, "feeling short of breath." This tech could hear pt wheezing while she was walking.

## 2017-09-14 NOTE — ED Notes (Signed)
Encouraged pt to provide urine sample.

## 2017-09-14 NOTE — ED Triage Notes (Signed)
Patient to ED c/o SOB and CP (and ribcage pain) x 2 days, worsening today - reports URI symptoms for about a week now, has hx COPD and neb treatments at home, which have been providing some relief until today. Expiratory wheezing bilaterally, endorses productive cough, and chills at home. Resp equal, but slightly labored, skin warm/dry.

## 2017-09-26 DIAGNOSIS — J449 Chronic obstructive pulmonary disease, unspecified: Secondary | ICD-10-CM | POA: Diagnosis not present

## 2017-10-01 ENCOUNTER — Ambulatory Visit (INDEPENDENT_AMBULATORY_CARE_PROVIDER_SITE_OTHER): Payer: Medicare HMO | Admitting: Family Medicine

## 2017-10-01 ENCOUNTER — Encounter: Payer: Self-pay | Admitting: Family Medicine

## 2017-10-01 VITALS — BP 140/88 | HR 102 | Ht 64.0 in | Wt 267.9 lb

## 2017-10-01 DIAGNOSIS — E559 Vitamin D deficiency, unspecified: Secondary | ICD-10-CM

## 2017-10-01 DIAGNOSIS — Z716 Tobacco abuse counseling: Secondary | ICD-10-CM | POA: Insufficient documentation

## 2017-10-01 DIAGNOSIS — G4733 Obstructive sleep apnea (adult) (pediatric): Secondary | ICD-10-CM

## 2017-10-01 DIAGNOSIS — E138 Other specified diabetes mellitus with unspecified complications: Secondary | ICD-10-CM

## 2017-10-01 DIAGNOSIS — B373 Candidiasis of vulva and vagina: Secondary | ICD-10-CM

## 2017-10-01 DIAGNOSIS — R809 Proteinuria, unspecified: Secondary | ICD-10-CM | POA: Diagnosis not present

## 2017-10-01 DIAGNOSIS — J449 Chronic obstructive pulmonary disease, unspecified: Secondary | ICD-10-CM

## 2017-10-01 DIAGNOSIS — E782 Mixed hyperlipidemia: Secondary | ICD-10-CM

## 2017-10-01 DIAGNOSIS — B3731 Acute candidiasis of vulva and vagina: Secondary | ICD-10-CM

## 2017-10-01 DIAGNOSIS — E1159 Type 2 diabetes mellitus with other circulatory complications: Secondary | ICD-10-CM | POA: Diagnosis not present

## 2017-10-01 DIAGNOSIS — E1129 Type 2 diabetes mellitus with other diabetic kidney complication: Secondary | ICD-10-CM

## 2017-10-01 DIAGNOSIS — E1169 Type 2 diabetes mellitus with other specified complication: Secondary | ICD-10-CM | POA: Diagnosis not present

## 2017-10-01 DIAGNOSIS — I10 Essential (primary) hypertension: Secondary | ICD-10-CM

## 2017-10-01 DIAGNOSIS — F17200 Nicotine dependence, unspecified, uncomplicated: Secondary | ICD-10-CM | POA: Diagnosis not present

## 2017-10-01 DIAGNOSIS — J4489 Other specified chronic obstructive pulmonary disease: Secondary | ICD-10-CM

## 2017-10-01 DIAGNOSIS — Z9989 Dependence on other enabling machines and devices: Secondary | ICD-10-CM

## 2017-10-01 DIAGNOSIS — F332 Major depressive disorder, recurrent severe without psychotic features: Secondary | ICD-10-CM

## 2017-10-01 DIAGNOSIS — E1142 Type 2 diabetes mellitus with diabetic polyneuropathy: Secondary | ICD-10-CM | POA: Diagnosis not present

## 2017-10-01 DIAGNOSIS — I152 Hypertension secondary to endocrine disorders: Secondary | ICD-10-CM

## 2017-10-01 DIAGNOSIS — D509 Iron deficiency anemia, unspecified: Secondary | ICD-10-CM

## 2017-10-01 HISTORY — DX: Nicotine dependence, unspecified, uncomplicated: F17.200

## 2017-10-01 HISTORY — DX: Type 2 diabetes mellitus with other specified complication: E11.69

## 2017-10-01 HISTORY — DX: Other specified chronic obstructive pulmonary disease: J44.89

## 2017-10-01 HISTORY — DX: Iron deficiency anemia, unspecified: D50.9

## 2017-10-01 HISTORY — DX: Chronic obstructive pulmonary disease, unspecified: J44.9

## 2017-10-01 HISTORY — DX: Mixed hyperlipidemia: E78.2

## 2017-10-01 LAB — POCT UA - MICROALBUMIN
Creatinine, POC: 50 mg/dL
Microalbumin Ur, POC: 10 mg/L

## 2017-10-01 LAB — POCT GLYCOSYLATED HEMOGLOBIN (HGB A1C): Hemoglobin A1C: 10.2 % — AB (ref 4.0–5.6)

## 2017-10-01 MED ORDER — CANAGLIFLOZIN 300 MG PO TABS: 300.0000 mg | ORAL_TABLET | Freq: Every day | ORAL | 0 refills | Status: DC

## 2017-10-01 MED ORDER — FLUCONAZOLE 200 MG PO TABS: 200.0000 mg | ORAL_TABLET | ORAL | 0 refills | Status: DC

## 2017-10-01 MED ORDER — ATORVASTATIN CALCIUM 10 MG PO TABS: 10.0000 mg | ORAL_TABLET | Freq: Every day | ORAL | 0 refills | Status: DC

## 2017-10-01 MED ORDER — LOSARTAN POTASSIUM-HCTZ 100-12.5 MG PO TABS: 1.0000 | ORAL_TABLET | Freq: Every day | ORAL | 0 refills | Status: DC

## 2017-10-01 MED ORDER — PREGABALIN 75 MG PO CAPS: 75.0000 mg | ORAL_CAPSULE | Freq: Two times a day (BID) | ORAL | 1 refills | Status: DC

## 2017-10-01 NOTE — Patient Instructions (Signed)
-Please call your psychiatrist Dr. Harrington Challenger -make an appointment to follow-up with them so you can be treated.  Whom you have seen in the past for your Cymbalta and ADD medicines if needed  -Please make sure you are tapering medicine slowly as we discussed and adding a new one every 4 to 5 days if tolerating medicine well then okay to start additional medicine.   Diabetes Mellitus and Standards of Medical Care  Managing diabetes (diabetes mellitus) can be complicated. Your diabetes treatment may be managed by a team of health care providers, including:  A diet and nutrition specialist (registered dietitian).  A nurse.  A certified diabetes educator (CDE).  A diabetes specialist (endocrinologist).  An eye doctor.  A primary care provider.  A dentist.  Your health care providers follow a schedule in order to help you get the best quality of care. The following schedule is a general guideline for your diabetes management plan. Your health care providers may also give you more specific instructions.  HbA1c (hemoglobin A1c) test This test provides information about blood sugar (glucose) control over the previous 2-3 months. It is used to check whether your diabetes management plan needs to be adjusted.  If you are meeting your treatment goals, this test is done at least 2 times a year.  If you are not meeting treatment goals or if your treatment goals have changed, this test is done 4 times a year.  Blood pressure test  This test is done at every routine medical visit. For most people, the goal is less than 130/80. Ask your health care provider what your goal blood pressure should be.  Dental and eye exams  Visit your dentist two times a year.  If you have type 1 diabetes, get an eye exam 3-5 years after you are diagnosed, and then once a year after your first exam. ? If you were diagnosed with type 1 diabetes as a child, get an eye exam when you are age 73 or older and have had  diabetes for 3-5 years. After the first exam, you should get an eye exam once a year.  If you have type 2 diabetes, have an eye exam as soon as you are diagnosed, and then once a year after your first exam.  Foot care exam  Visual foot exams are done at every routine medical visit. The exams check for cuts, bruises, redness, blisters, sores, or other problems with the feet.  A complete foot exam is done by your health care provider once a year. This exam includes an inspection of the structure and skin of your feet, and a check of the pulses and sensation in your feet. ? Type 1 diabetes: Get your first exam 3-5 years after diagnosis. ? Type 2 diabetes: Get your first exam as soon as you are diagnosed.  Check your feet every day for cuts, bruises, redness, blisters, or sores. If you have any of these or other problems that are not healing, contact your health care provider.  Kidney function test (urine microalbumin)  This test is done once a year. ? Type 1 diabetes: Get your first test 5 years after diagnosis. ? Type 2 diabetes: Get your first test as soon as you are diagnosed._  If you have chronic kidney disease (CKD), get a serum creatinine and estimated glomerular filtration rate (eGFR) test once a year.  Lipid profile (cholesterol, HDL, LDL, triglycerides)  This test should be done when you are diagnosed with diabetes, and every  5 years after the first test. If you are on medicines to lower your cholesterol, you may need to get this test done every year. ? The goal for LDL is less than 100 mg/dL (5.5 mmol/L). If you are at high risk, the goal is less than 70 mg/dL (3.9 mmol/L). ? The goal for HDL is 40 mg/dL (2.2 mmol/L) for men and 50 mg/dL(2.8 mmol/L) for women. An HDL cholesterol of 60 mg/dL (3.3 mmol/L) or higher gives some protection against heart disease. ? The goal for triglycerides is less than 150 mg/dL (8.3 mmol/L).  Immunizations  The yearly flu (influenza) vaccine is  recommended for everyone 6 months or older who has diabetes.  The pneumonia (pneumococcal) vaccine is recommended for everyone 2 years or older who has diabetes. If you are 72 or older, you may get the pneumonia vaccine as a series of two separate shots.  The hepatitis B vaccine is recommended for adults shortly after they have been diagnosed with diabetes.  The Tdap (tetanus, diphtheria, and pertussis) vaccine should be given: ? According to normal childhood vaccination schedules, for children. ? Every 10 years, for adults who have diabetes.  The shingles vaccine is recommended for people who have had chicken pox and are 50 years or older.  Mental and emotional health  Screening for symptoms of eating disorders, anxiety, and depression is recommended at the time of diagnosis and afterward as needed. If your screening shows that you have symptoms (you have a positive screening result), you may need further evaluation and be referred to a mental health care provider.  Diabetes self-management education  Education about how to manage your diabetes is recommended at diagnosis and ongoing as needed.  Treatment plan  Your treatment plan will be reviewed at every medical visit.  Summary  Managing diabetes (diabetes mellitus) can be complicated. Your diabetes treatment may be managed by a team of health care providers.  Your health care providers follow a schedule in order to help you get the best quality of care.  Standards of care including having regular physical exams, blood tests, blood pressure monitoring, immunizations, screening tests, and education about how to manage your diabetes.  Your health care providers may also give you more specific instructions based on your individual health.      Type 2 Diabetes Mellitus, Self Care, Adult Caring for yourself after you have been diagnosed with type 2 diabetes (type 2 diabetes mellitus) means keeping your blood sugar (glucose) under  control with a balance of:  Nutrition.  Exercise.  Lifestyle changes.  Medicines or insulin, if necessary.  Support from your team of health care providers and others.  The following information explains what you need to know to manage your diabetes at home. What do I need to do to manage my blood glucose?  Check your blood glucose every day, as often as told by your health care provider.  Contact your health care provider if your blood glucose is above your target for 2 tests in a row.  Have your A1c (hemoglobin A1c) level checked at least two times a year, or as often as told by your health care provider. Your health care provider will set individualized treatment goals for you. Generally, the goal of treatment is to maintain the following blood glucose levels:  Before meals (preprandial): 80-130 mg/dL (4.4-7.2 mmol/L).  After meals (postprandial): below 180 mg/dL (10 mmol/L).  A1c level: less than 7%.  What do I need to know about hyperglycemia and hypoglycemia?  What is hyperglycemia? Hyperglycemia, also called high blood glucose, occurs when blood glucose is too high.Make sure you know the early signs of hyperglycemia, such as:  Increased thirst.  Hunger.  Feeling very tired.  Needing to urinate more often than usual.  Blurry vision.  What is hypoglycemia? Hypoglycemia, also called low blood glucose, occurswith a blood glucose level at or below 70 mg/dL (3.9 mmol/L). The risk for hypoglycemia increases during or after exercise, during sleep, during illness, and when skipping meals or not eating for a long time (fasting). It is important to know the symptoms of hypoglycemia and treat it right away. Always have a 15-gram rapid-acting carbohydrate snack with you to treat low blood glucose. Family members and close friends should also know the symptoms and should understand how to treat hypoglycemia, in case you are not able to treat yourself. What are the symptoms of  hypoglycemia? Hypoglycemia symptoms can include:  Hunger.  Anxiety.  Sweating and feeling clammy.  Confusion.  Dizziness or feeling light-headed.  Sleepiness.  Nausea.  Increased heart rate.  Headache.  Blurry vision.  Seizure.  Nightmares.  Tingling or numbness around the mouth, lips, or tongue.  A change in speech.  Decreased ability to concentrate.  A change in coordination.  Restless sleep.  Tremors or shakes.  Fainting.  Irritability.  How do I treat hypoglycemia?  If you are alert and able to swallow safely, follow the 15:15 rule:  Take 15 grams of a rapid-acting carbohydrate. Rapid-acting options include: ? 1 tube of glucose gel. ? 3 glucose pills. ? 6-8 pieces of hard candy. ? 4 oz (120 mL) of fruit juice. ? 4 oz (120 mL) of regular (not diet) soda.  Check your blood glucose 15 minutes after you take the carbohydrate.  If the repeat blood glucose level is still at or below 70 mg/dL (3.9 mmol/L), take 15 grams of a carbohydrate again.  If your blood glucose level does not increase above 70 mg/dL (3.9 mmol/L) after 3 tries, seek emergency medical care.  After your blood glucose level returns to normal, eat a meal or a snack within 1 hour.  How do I treat severe hypoglycemia? Severe hypoglycemia is when your blood glucose level is at or below 54 mg/dL (3 mmol/L). Severe hypoglycemia is an emergency. Do not wait to see if the symptoms will go away. Get medical help right away. Call your local emergency services (911 in the [place] Do not drive yourself to the hospital. If you have severe hypoglycemia and you cannot eat or drink, you may need an injection of glucagon. A family member or close friend should learn how to check your blood glucose and how to give you a glucagon injection. Ask your health care provider if you need to have an emergency glucagon injection kit available. Severe hypoglycemia may need to be treated in a hospital. The treatment  may include getting glucose through an IV tube. You may also need treatment for the cause of your hypoglycemia. Can having diabetes put me at risk for other conditions? Having diabetes can put you at risk for other long-term (chronic) conditions, such as heart disease and kidney disease. Your health care provider may prescribe medicines to help prevent complications from diabetes. These medicines may include:  Aspirin.  Medicine to lower cholesterol.  Medicine to control blood pressure.  What else can I do to manage my diabetes? Take your diabetes medicines as told  If your health care provider prescribed insulin or diabetes medicines,  take them every day.  Do not run out of insulin or other diabetes medicines that you take. Plan ahead so you always have these available.  If you use insulin, adjust your dosage based on how physically active you are and what foods you eat. Your health care provider will tell you how to adjust your dosage. Make healthy food choices  The things that you eat and drink affect your blood glucose and your insulin dosage. Making good choices helps to control your diabetes and prevent other health problems. A healthy meal plan includes eating lean proteins, complex carbohydrates, fresh fruits and vegetables, low-fat dairy products, and healthy fats. Make an appointment to see a diet and nutrition specialist (registered dietitian) to help you create an eating plan that is right for you. Make sure that you:  Follow instructions from your health care provider about eating or drinking restrictions.  Drink enough fluid to keep your urine clear or pale yellow.  Eat healthy snacks between nutritious meals.  Track the carbohydrates that you eat. Do this by reading food labels and learning the standard serving sizes of foods.  Follow your sick day plan whenever you cannot eat or drink as usual. Make this plan in advance with your health care provider.  Stay  active  Exercise regularly, as told by your health care provider. This may include:  Stretching and doing strength exercises, such as yoga or weightlifting, at least 2 times a week.  Doing at least 150 minutes of moderate-intensity or vigorous-intensity exercise each week. This could be brisk walking, biking, or water aerobics. ? Spread out your activity over at least 3 days of the week. ? Do not go more than 2 days in a row without doing some kind of physical activity.  When you start a new exercise or activity, work with your health care provider to adjust your insulin, medicines, or food intake as needed. Make healthy lifestyle choices  Do not use any tobacco products, such as cigarettes, chewing tobacco, and e-cigarettes. If you need help quitting, ask your health care provider.  If your health care provider says that alcohol is safe for you, limit alcohol intake to no more than 1 drink per day for nonpregnant women and 2 drinks per day for men. One drink equals 12 oz of beer, 5 oz of wine, or 1 oz of hard liquor.  Learn to manage stress. If you need help with this, ask your health care provider. Care for your body   Keep your immunizations up to date. In addition to getting vaccinations as told by your health care provider, it is recommended that you get vaccinated against the following illnesses: ? The flu (influenza). Get a flu shot every year. ? Pneumonia. ? Hepatitis B.  Schedule an eye exam soon after your diagnosis, and then one time every year after that.  Check your skin and feet every day for cuts, bruises, redness, blisters, or sores. Schedule a foot exam with your health care provider once every year.  Brush your teeth and gums two times a day, and floss at least one time a day. Visit your dentist at least once every 6 months.  Maintain a healthy weight. General instructions  Take over-the-counter and prescription medicines only as told by your health care  provider.  Share your diabetes management plan with people in your workplace, school, and household.  Check your urine for ketones when you are ill and as told by your health care provider.  Ask  your health care provider: ? Do I need to meet with a diabetes educator? ? Where can I find a support group for people with diabetes?  Carry a medical alert card or wear medical alert jewelry.  Keep all follow-up visits as told by your health care provider. This is important. Where to find more information: For more information about diabetes, visit:  American Diabetes Association (ADA): www.diabetes.org  American Association of Diabetes Educators (AADE): www.diabeteseducator.org/patient-resources  This information is not intended to replace advice given to you by your health care provider. Make sure you discuss any questions you have with your health care provider. Document Released: 08/02/2015 Document Revised: 09/16/2015 Document Reviewed: 05/14/2015 Elsevier Interactive Patient Education  2017 [place].      Blood Glucose Monitoring, Adult Monitoring your blood sugar (glucose) helps you manage your diabetes. It also helps you and your health care provider determine how well your diabetes management plan is working. Blood glucose monitoring involves checking your blood glucose as often as directed, and keeping a record (log) of your results over time. Why should I monitor my blood glucose? Checking your blood glucose regularly can:  Help you understand how food, exercise, illnesses, and medicines affect your blood glucose.  Let you know what your blood glucose is at any time. You can quickly tell if you are having low blood glucose (hypoglycemia) or high blood glucose (hyperglycemia).  Help you and your health care provider adjust your medicines as needed.  When should I check my blood glucose? Follow instructions from your health care provider about how often to check your  blood glucose.   This may depend on:  The type of diabetes you have.  How well-controlled your diabetes is.  Medicines you are taking.  If you have type 1 diabetes:  Check your blood glucose at least 2 times a day.  Also check your blood glucose: ? Before every insulin injection. ? Before and after exercise. ? Between meals. ? 2 hours after a meal. ? Occasionally between 2:00 a.m. and 3:00 a.m., as directed. ? Before potentially dangerous tasks, like driving or using heavy machinery. ? At bedtime.  You may need to check your blood glucose more often, up to 6-10 times a day: ? If you use an insulin pump. ? If you need multiple daily injections (MDI). ? If your diabetes is not well-controlled. ? If you are ill. ? If you have a history of severe hypoglycemia. ? If you have a history of not knowing when your blood glucose is getting low (hypoglycemia unawareness).  If you have type 2 diabetes:  If you take insulin or other diabetes medicines, check your blood glucose at least 2 times a day.  If you are on intensive insulin therapy, check your blood glucose at least 4 times a day. Occasionally, you may also need to check between 2:00 a.m. and 3:00 a.m., as directed.  Also check your blood glucose: ? Before and after exercise. ? Before potentially dangerous tasks, like driving or using heavy machinery.  You may need to check your blood glucose more often if: ? Your medicine is being adjusted. ? Your diabetes is not well-controlled. ? You are ill.  What is a blood glucose log?  A blood glucose log is a record of your blood glucose readings. It helps you and your health care provider: ? Look for patterns in your blood glucose over time. ? Adjust your diabetes management plan as needed.  Every time you check your blood  glucose, write down your result and notes about things that may be affecting your blood glucose, such as your diet and exercise for the day.  Most glucose  meters store a record of glucose readings in the meter. Some meters allow you to download your records to a computer. How do I check my blood glucose? Follow these steps to get accurate readings of your blood glucose: Supplies needed   Blood glucose meter.  Test strips for your meter. Each meter has its own strips. You must use the strips that come with your meter.  A needle to prick your finger (lancet). Do not use lancets more than once.  A device that holds the lancet (lancing device).  A journal or log book to write down your results.  Procedure  Wash your hands with soap and water.  Prick the side of your finger (not the tip) with the lancet. Use a different finger each time.  Gently rub the finger until a small drop of blood appears.  Follow instructions that come with your meter for inserting the test strip, applying blood to the strip, and using your blood glucose meter.  Write down your result and any notes.  Alternative testing sites  Some meters allow you to use areas of your body other than your finger (alternative sites) to test your blood.  If you think you may have hypoglycemia, or if you have hypoglycemia unawareness, do not use alternative sites. Use your finger instead.  Alternative sites may not be as accurate as the fingers, because blood flow is slower in these areas. This means that the result you get may be delayed, and it may be different from the result that you would get from your finger.  The most common alternative sites are: ? Forearm. ? Thigh. ? Palm of the hand.  Additional tips  Always keep your supplies with you.  If you have questions or need help, all blood glucose meters have a 24-hour "hotline" number that you can call. You may also contact your health care provider.  After you use a few boxes of test strips, adjust (calibrate) your blood glucose meter by following instructions that came with your meter.    The American Diabetes  Association suggests the following targets for most nonpregnant adults with diabetes.  More or less stringent glycemic goals may be appropriate for each individual.  A1C: Less than 7% A1C may also be reported as eAG: Less than 154 mg/dl Before a meal (preprandial plasma glucose): 80-130 mg/dl 1-2 hours after beginning of the meal (Postprandial plasma glucose)*: Less than 180 mg/dl  *Postprandial glucose may be targeted if A1C goals are not met despite reaching preprandial glucose goals.   GOALS in short:  The goals are for the Hgb A1C to be less than 7.0 & blood pressure to be less than 130/80.    It is recommended that all diabetics are educated on and follow a healthy diabetic diet, exercise for 30 minutes 3-4 times per week (walking, biking, swimming, or machine), monitor blood glucose readings and bring that record with you to be reviewed at your next office visit.     You should be checking fasting blood sugars- especially after you eat poorly or eat really healthy, and also check 2 hour postprandial blood sugars after largest meal of the day.    Write these down and bring in your log at each office visit.    You will need to be seen every 3 months by the  provider managing your Diabetes unless told otherwise by that provider.   You will need yearly eye exams from an eye specialist and foot exams to check the nerves of your feet.  Also, your urine should be checked yearly as well to make sure excess protein is not present.   If you are checking your blood pressure at home, please record it and bring it to your next office visit.    Follow the Dietary Approaches to Stop Hypertension (DASH) diet (3 servings of fruit and vegetables daily, whole grains, low sodium, low-fat proteins).  See below.    Lastly, when it comes to your cholesterol, the goal is to have the HDL (good cholesterol) >40, and the LDL (bad cholesterol) <100.   It is recommended that you follow a heart healthy, low saturated  and trans-fat diet and exercise for 30 minutes at least 5 times a week.     (( Check out the DASH diet = 1.5 Gram Low Sodium Diet   A 1.5 gram sodium diet restricts the amount of sodium in the diet to no more than 1.5 g or 1500 mg daily.  The American Heart Association recommends Americans over the age of 70 to consume no more than 1500 mg of sodium each day to reduce the risk of developing high blood pressure.  Research also shows that limiting sodium may reduce heart attack and stroke risk.  Many foods contain sodium for flavor and sometimes as a preservative.  When the amount of sodium in a diet needs to be low, it is important to know what to look for when choosing foods and drinks.  The following includes some information and guidelines to help make it easier for you to adapt to a low sodium diet.    QUICK TIPS  Do not add salt to food.  Avoid convenience items and fast food.  Choose unsalted snack foods.  Buy lower sodium products, often labeled as "lower sodium" or "no salt added."  Check food labels to learn how much sodium is in 1 serving.  When eating at a restaurant, ask that your food be prepared with less salt or none, if possible.    READING FOOD LABELS FOR SODIUM INFORMATION  The nutrition facts label is a good place to find how much sodium is in foods. Look for products with no more than 400 mg of sodium per serving.  Remember that 1.5 g = 1500 mg.  The food label may also list foods as:  Sodium-free: Less than 5 mg in a serving.  Very low sodium: 35 mg or less in a serving.  Low-sodium: 140 mg or less in a serving.  Light in sodium: 50% less sodium in a serving. For example, if a food that usually has 300 mg of sodium is changed to become light in sodium, it will have 150 mg of sodium.  Reduced sodium: 25% less sodium in a serving. For example, if a food that usually has 400 mg of sodium is changed to reduced sodium, it will have 300 mg of sodium.    CHOOSING FOODS   Grains  Avoid: Salted crackers and snack items. Some cereals, including instant hot cereals. Bread stuffing and biscuit mixes. Seasoned rice or pasta mixes.  Choose: Unsalted snack items. Low-sodium cereals, oats, puffed wheat and rice, shredded wheat. English muffins and bread. Pasta.  Meats  Avoid: Salted, canned, smoked, spiced, pickled meats, including fish and poultry. Bacon, ham, sausage, cold cuts, hot dogs, anchovies.  Choose:  Low-sodium canned tuna and salmon. Fresh or frozen meat, poultry, and fish.  Dairy  Avoid: Processed cheese and spreads. Cottage cheese. Buttermilk and condensed milk. Regular cheese.  Choose: Milk. Low-sodium cottage cheese. Yogurt. Sour cream. Low-sodium cheese.  Fruits and Vegetables  Avoid: Regular canned vegetables. Regular canned tomato sauce and paste. Frozen vegetables in sauces. Olives. Angie Fava. Relishes. Sauerkraut.  Choose: Low-sodium canned vegetables. Low-sodium tomato sauce and paste. Frozen or fresh vegetables. Fresh and frozen fruit.  Condiments  Avoid: Canned and packaged gravies. Worcestershire sauce. Tartar sauce. Barbecue sauce. Soy sauce. Steak sauce. Ketchup. Onion, garlic, and table salt. Meat flavorings and tenderizers.  Choose: Fresh and dried herbs and spices. Low-sodium varieties of mustard and ketchup. Lemon juice. Tabasco sauce. Horseradish.    SAMPLE 1.5 GRAM SODIUM MEAL PLAN:   Breakfast / Sodium (mg)  1 cup low-fat milk / 143 mg  1 whole-wheat English muffin / 240 mg  1 tbs heart-healthy margarine / 153 mg  1 hard-boiled egg / 139 mg  1 small orange / 0 mg  Lunch / Sodium (mg)  1 cup raw carrots / 76 mg  2 tbs no salt added peanut butter / 5 mg  2 slices whole-wheat bread / 270 mg  1 tbs jelly / 6 mg   cup red grapes / 2 mg  Dinner / Sodium (mg)  1 cup whole-wheat pasta / 2 mg  1 cup low-sodium tomato sauce / 73 mg  3 oz lean ground beef / 57 mg  1 small side salad (1 cup raw spinach leaves,  cup cucumber,  cup  yellow bell pepper) with 1 tsp olive oil and 1 tsp red wine vinegar / 25 mg  Snack / Sodium (mg)  1 container low-fat vanilla yogurt / 107 mg  3 graham cracker squares / 127 mg  Nutrient Analysis  Calories: 1745  Protein: 75 g  Carbohydrate: 237 g  Fat: 57 g  Sodium: 1425 mg  Document Released: 04/10/2005 Document Revised: 12/21/2010 Document Reviewed: 07/12/2009  Exit[employer] Patient Information 2012 [place].))    This information is not intended to replace advice given to you by your health care provider. Make sure you discuss any questions you have with your health care provider. Document Released: 04/13/2003 Document Revised: 10/29/2015 Document Reviewed: 09/20/2015 Elsevier Interactive Patient Education  2017 [place].        Please realize, EXERCISE IS MEDICINE!  -  American Heart Association ( AHA) guidelines for exercise : If you are in good health, without any medical conditions, you should engage in 150 minutes of moderate intensity aerobic activity per week.  This means you should be huffing and puffing throughout your workout.   Engaging in regular exercise will improve brain function and memory, as well as improve mood, boost immune system and help with weight management.  As well as the other, more well-known effects of exercise such as decreasing blood sugar levels, decreasing blood pressure,  and decreasing bad cholesterol levels/ increasing good cholesterol levels.     -  The AHA strongly endorses consumption of a diet that contains a variety of foods from all the food categories with an emphasis on fruits and vegetables; fat-free and low-fat dairy products; cereal and grain products; legumes and nuts; and fish, poultry, and/or extra lean meats.    Excessive food intake, especially of foods high in saturated and trans fats, sugar, and salt, should be avoided.    Adequate water intake of roughly 1/2 of your weight in  pounds, should equal the ounces of water per  day you should drink.  So for instance, if you're 200 pounds, that would be 100 ounces of water per day.         Mediterranean Diet  Why follow it? Research shows. . Those who follow the Mediterranean diet have a reduced risk of heart disease  . The diet is associated with a reduced incidence of Parkinson's and Alzheimer's diseases . People following the diet may have longer life expectancies and lower rates of chronic diseases  . The Dietary Guidelines for Americans recommends the Mediterranean diet as an eating plan to promote health and prevent disease  What Is the Mediterranean Diet?  . Healthy eating plan based on typical foods and recipes of Mediterranean-style cooking . The diet is primarily a plant based diet; these foods should make up a majority of meals   Starches - Plant based foods should make up a majority of meals - They are an important sources of vitamins, minerals, energy, antioxidants, and fiber - Choose whole grains, foods high in fiber and minimally processed items  - Typical grain sources include wheat, oats, barley, corn, brown rice, bulgar, farro, millet, polenta, couscous  - Various types of beans include chickpeas, lentils, fava beans, black beans, white beans   Fruits  Veggies - Large quantities of antioxidant rich fruits & veggies; 6 or more servings  - Vegetables can be eaten raw or lightly drizzled with oil and cooked  - Vegetables common to the traditional Mediterranean Diet include: artichokes, arugula, beets, broccoli, brussel sprouts, cabbage, carrots, celery, collard greens, cucumbers, eggplant, kale, leeks, lemons, lettuce, mushrooms, okra, onions, peas, peppers, potatoes, pumpkin, radishes, rutabaga, shallots, spinach, sweet potatoes, turnips, zucchini - Fruits common to the Mediterranean Diet include: apples, apricots, avocados, cherries, clementines, dates, figs, grapefruits, grapes, melons, nectarines, oranges, peaches, pears, pomegranates,  strawberries, tangerines  Fats - Replace butter and margarine with healthy oils, such as olive oil, canola oil, and tahini  - Limit nuts to no more than a handful a day  - Nuts include walnuts, almonds, pecans, pistachios, pine nuts  - Limit or avoid candied, honey roasted or heavily salted nuts - Olives are central to the [employer] - can be eaten whole or used in a variety of dishes   Meats Protein - Limiting red meat: no more than a few times a month - When eating red meat: choose lean cuts and keep the portion to the size of deck of cards - Eggs: approx. 0 to 4 times a week  - Fish and lean poultry: at least 2 a week  - Healthy protein sources include, chicken, [place], lean beef, lamb - Increase intake of seafood such as tuna, salmon, trout, mackerel, shrimp, scallops - Avoid or limit high fat processed meats such as sausage and bacon  Dairy - Include moderate amounts of low fat dairy products  - Focus on healthy dairy such as fat free yogurt, skim milk, low or reduced fat cheese - Limit dairy products higher in fat such as whole or 2% milk, cheese, ice cream  Alcohol - Moderate amounts of red wine is ok  - No more than 5 oz daily for women (all ages) and men older than age 32  - No more than 10 oz of wine daily for men younger than 60  Other - Limit sweets and other desserts  - Use herbs and spices instead of salt to flavor foods  - Herbs and spices common to  the traditional Mediterranean Diet include: basil, bay leaves, chives, cloves, cumin, fennel, garlic, lavender, marjoram, mint, oregano, parsley, pepper, rosemary, sage, savory, sumac, tarragon, thyme   It's not just a diet, it's a lifestyle:  . The Mediterranean diet includes lifestyle factors typical of those in the region  . Foods, drinks and meals are best eaten with others and savored . Daily physical activity is important for overall good health . This could be strenuous exercise like running and aerobics . This  could also be more leisurely activities such as walking, housework, yard-work, or taking the stairs . Moderation is the key; a balanced and healthy diet accommodates most foods and drinks . Consider portion sizes and frequency of consumption of certain foods   Meal Ideas & Options:  . Breakfast:  o Whole wheat toast or whole wheat English muffins with peanut butter & hard boiled egg o Steel cut oats topped with apples & cinnamon and skim milk  o Fresh fruit: banana, strawberries, melon, berries, peaches  o Smoothies: strawberries, bananas, greek yogurt, peanut butter o Low fat greek yogurt with blueberries and granola  o Egg white omelet with spinach and mushrooms o Breakfast couscous: whole wheat couscous, apricots, skim milk, cranberries  . Sandwiches:  o Hummus and grilled vegetables (peppers, zucchini, squash) on whole wheat bread   o Grilled chicken on whole wheat pita with lettuce, tomatoes, cucumbers or tzatziki  o Tuna salad on whole wheat bread: tuna salad made with greek yogurt, olives, red peppers, capers, green onions o Garlic rosemary lamb pita: lamb sauted with garlic, rosemary, salt & pepper; add lettuce, cucumber, greek yogurt to pita - flavor with lemon juice and black pepper  . Seafood:  o Mediterranean grilled salmon, seasoned with garlic, basil, parsley, lemon juice and black pepper o Shrimp, lemon, and spinach whole-grain pasta salad made with low fat greek yogurt  o Seared scallops with lemon orzo  o Seared tuna steaks seasoned salt, pepper, coriander topped with tomato mixture of olives, tomatoes, olive oil, minced garlic, parsley, green onions and cappers  . Meats:  o Herbed greek chicken salad with kalamata olives, cucumber, feta  o Red bell peppers stuffed with spinach, bulgur, lean ground beef (or lentils) & topped with feta   o Kebabs: skewers of chicken, tomatoes, onions, zucchini, squash  o [place] burgers: made with red onions, mint, dill, lemon juice, feta  cheese topped with roasted red peppers . Vegetarian o Cucumber salad: cucumbers, artichoke hearts, celery, red onion, feta cheese, tossed in olive oil & lemon juice  o Hummus and whole grain pita points with a greek salad (lettuce, tomato, feta, olives, cucumbers, red onion) o Lentil soup with celery, carrots made with vegetable broth, garlic, salt and pepper  o Tabouli salad: parsley, bulgur, mint, scallions, cucumbers, tomato, radishes, lemon juice, olive oil, salt and pepper.

## 2017-10-01 NOTE — Progress Notes (Signed)
New patient office visit note:  Impression and Recommendations:    1. Diabetes mellitus with complication, without long-term current use of insulin ([place])   2. Hypertension associated with diabetes ([place])   3. Mixed diabetic hyperlipidemia associated with type 2 diabetes mellitus ([place])   4. Diabetic peripheral neuropathy (HCC)   5. Persistent proteinuria associated with type 2 diabetes mellitus ([place])   6. COPD with chronic bronchitis ([place])   7. OSA on CPAP   8. Tobacco use disorder-current smoker greater than 40-pack-year history- since age 42 1 ppd   87. Tobacco abuse counseling   10. Morbid obesity ([place])   11. Iron deficiency anemia, unspecified iron deficiency anemia type   12. Vitamin D deficiency   13. Severe episode of recurrent major depressive disorder, without psychotic features ([place])   14. Vaginal candidiasis     1. DM2 -A1c today is 10.2%. A1c was 7.1% on invokana in the past.  -restart invokana.  -We will possibly start ozempic on next FUP to which pt agreed. -Poorly controlled at this time. -Without further workup and treatment planned, risk to pt's morbidity is high with possible prolonged functional impairment and poor health outcomes.  -diabetic nutritionist referral given.   -Prudent diet and exercise discussed. Walk 5 minutes a day, increasing slowly over time.  -Drink adequate amounts of water per day, equal to half of your wt in oz.-diabetic nutritionist referral given.   -Prudent diet and exercise discussed. Walk 5 minutes a day, increasing slowly over time.  -Drink adequate amounts of water per day, equal to half of your wt in oz.   2. HTN -BP poorly controlled at this time.  -start losartan-hctz.  -check your BP at home and keep a log. Bring this into next OV.  -Goal BP <130/80.  -would consider adding carvedilol as next step, but tachycardia likely due to morbid obesity and physical deconditioning.  3. Mixed diabetic HLD -restart  lipitor. -will check FLP in near future.  -prudent diet/exercise discussed.   4. Diabetic peripheral neuropathy -restart lyrica.  5. Persistent proteinuria -control BS, BP, lose weight. Eventual goal: A1c < 7.0   6. COPD -Seen in the past by Dr. Melvyn Novas, pulmonology.  -referral given to pulmonology.   7. OSA on CPAP -referral given to pulmonology. Continue CPAP at home.   8. Tobacco use disorder/abuse -recommend stopping smoking.   9. Morbid obesity -recommend losing weight. Prudent diet/exercise discussed.   10. Iron deficiency anemia -check labs in near future.   11. Vit D deficiency -check labs in future.   12. Severe episode of recurrent major depressive disorder -referral to psychiatry given. Pt has seen Dr. Harrington Challenger in the past.  13. Vaginal Candidiasis -start diflucan.     -Pt understands that we are starting numerous meds and she needs to not start all her meds at once. She will start a new medication every 3-4 days, ensuring she tolerates the one prior to the next.   -pt understands she needs to follow up in the future after starting meds for FLP and baseline lab work.    Pt was in the office today for 60+ minutes, with over 50% time spent in face to face counseling of patients various medical conditions, treatment plans of those medical conditions including medicine management and lifestyle modification, strategies to improve health and well being; and in coordination of care. SEE ABOVE TREATMENT PLAN FOR DETAILS   Education and routine counseling performed. Handouts provided.  Orders Placed  This Encounter  Procedures  . CBC with Differential/Platelet  . Comprehensive metabolic panel  . Lipid panel  . TSH  . VITAMIN D 25 Hydroxy (Vit-D Deficiency, Fractures)  . T4, free  . Amb Referral to Nutrition and Diabetic E  . Ambulatory referral to Pulmonology  . POCT glycosylated hemoglobin (Hb A1C)  . POCT UA - Microalbumin    Meds ordered this encounter   Medications  . canagliflozin (INVOKANA) 300 MG TABS tablet    Sig: Take 1 tablet (300 mg total) by mouth daily before breakfast.    Dispense:  60 tablet    Refill:  0  . pregabalin (LYRICA) 75 MG capsule    Sig: Take 1 capsule (75 mg total) by mouth 2 (two) times daily.    Dispense:  60 capsule    Refill:  1  . fluconazole (DIFLUCAN) 200 MG tablet    Sig: Take 1 tablet (200 mg total) by mouth once a week.    Dispense:  4 tablet    Refill:  0  . atorvastatin (LIPITOR) 10 MG tablet    Sig: Take 1 tablet (10 mg total) by mouth at bedtime.    Dispense:  60 tablet    Refill:  0  . losartan-hydrochlorothiazide (HYZAAR) 100-12.5 MG tablet    Sig: Take 1 tablet by mouth daily.    Dispense:  60 tablet    Refill:  0    Gross side effects, risk and benefits, and alternatives of medications discussed with patient.  Patient is aware that all medications have potential side effects and we are unable to predict every side effect or drug-drug interaction that may occur.  Expresses verbal understanding and consents to current therapy plan and treatment regimen.  Return for Chronic OV w me near future & FBW 2-3d prior.  Please see AVS handed out to patient at the end of our visit for further patient instructions/ counseling done pertaining to today's office visit.    Note: This document was prepared using Dragon voice recognition software and may include unintentional dictation errors.  This document serves as a record of services personally performed by Mellody Dance, DO. It was created on her behalf by Mayer Masker, a trained medical scribe. The creation of this record is based on the scribe's personal observations and the provider's statements to them.   I have reviewed the above medical documentation for accuracy and completeness and I concur.  Mellody Dance 10/02/17 12:28  PM  ----------------------------------------------------------------------------------------------------------------------    Subjective:    Chief complaint:   Chief Complaint  Patient presents with  . Establish Care     HPI: [name] is a pleasant 52 y.o. female who presents to [place] [facility] today to review their medical history with me and establish care.   I asked the patient to review their chronic problem list with me to ensure everything was updated and accurate.    All recent office visits with other providers, any medical records that patient brought in etc  - I reviewed today.     We asked pt to get [place] their medical records from [facility] providers/ specialists that they had seen within the past 3-5 years- if they are in private practice and/or do not work for [employer], [facility], [place], [place] or [employer] owned practice.  Told them to call their specialists to clarify this if they are not sure.   Personal information She has a fiance at home, [place], and  is getting married with him next month.    Other providers Her PCP was at [facility] near [place]. [facility].   She saw Dr. Buelah Manis at [facility]- she is switching to be closer to her house.   She has seen pulmonology, Dr. Melvyn Novas.  Psychiatrist, Dr. Harrington Challenger.  PMHx  Sleep apnea- uses CPAP machine at night.  -Depression  -DM2- dx 2010 first diagnosed. First A1c was 8.3. -she has tried invokana, but SE of yeast infections. Never seen endo. Has also tried metformin but SE of abd pain and nausea. ED visit in Dec 2018 for hypeglycemia, retried metformin due to SE. Her fasting BS would be 200s then. Now they are 300-400s. After being on prednisone it was 500s-600s.   -B/L Neuropathy in feet that radiates up her legs- she has numbness and pain that she took lyrica for that. She has been off this since Oct 2018.  She has swelling and pain in her toes and  feet.   Mammogram, anoscopy, colonoscopy all not UTD.  HTN- dx in 2012. She has tachycardia and has tried bisoprolol. Never been evaluated by cardiology.   COPD- she uses albuterol rescue inhalers at home.  daliresp-  Given by pulmonology. She is prone to PNA. Last visit with them was 4 years prior.   HLD- per pt, her HDL was too low and her LDL was WNL. She has been on lipitor in the past.  Depression- was on cymbalta.  Psychiatry- adderall given by psych.  Low iron anemia-   Vaginal She complains of vaginal and bloody, white, itchy discharge that burns that began s/p ED hospitalization where she was given doxy for COPD exacerbation. Used OTC monostat which provided some relief but not all the way. She states she has some yeast in her stool as well.   FMHx Mother- Age 29 breast CA. CHF onset late 18s, passed age 38. Depression, DM2, HTN, HLD Father- CAD/MI, died age 72s- limited history. Sister- depression  PSHx SHx -40 year pack year history. She has tried chantix with no relief to smoking cessation- no SE.  -drinks coffee and caffeine drinks 5 times per day.  See below for more details:   Wt Readings from Last 3 Encounters:  10/01/17 267 lb 14.4 oz (121.5 kg)  03/10/15 266 lb (120.7 kg)  10/19/14 281 lb (127.5 kg)   BP Readings from Last 3 Encounters:  10/01/17 140/88  09/14/17 132/61  03/09/17 (!) 141/80   Pulse Readings from Last 3 Encounters:  10/01/17 (!) 102  09/14/17 (!) 110  03/09/17 92   BMI Readings from Last 3 Encounters:  10/01/17 45.98 kg/m  03/10/15 44.26 kg/m  10/19/14 47.12 kg/m    Patient Care Team    Relationship Specialty Notifications Start End  Patient, No Pcp Per PCP - General General Practice  03/09/17   Danie Binder, MD  Gastroenterology  09/08/10     Patient Active Problem List   Diagnosis Date Noted  . COPD with chronic bronchitis ([place]) 10/01/2017  . Mixed diabetic hyperlipidemia associated with type 2 diabetes mellitus  ([place]) 10/01/2017  . Tobacco use disorder-current smoker greater than 40-pack-year history- since age 36 1 ppd 10/01/2017  . Tobacco abuse counseling 10/01/2017  . Diabetic peripheral neuropathy ([place]) 10/01/2017  . Iron deficiency anemia 10/01/2017  . Adult attention deficit hyperactivity disorder 01/14/2016  . Diabetes mellitus ([place]) 10/18/2015  . Gastric reflux 10/18/2015  . Dyspepsia 09/23/2014  . Major depressive  disorder, recurrent, severe without psychotic features ([place])   . MDD (major depressive disorder), recurrent episode, severe ([place]) 09/13/2014  . Nasal congestion 05/03/2014  . Acute on chronic respiratory failure with hypoxia ([place])   . Chest pain on respiration 03/25/2014  . Upper airway cough syndrome 02/28/2014  . Abnormal drug screen 04/11/2013  . Sleep apnea 04/11/2013  . Perforated ear drum 05/22/2012  . Bell's palsy 01/23/2012  . Carpal tunnel syndrome 01/23/2012  . Carpal tunnel syndrome on both sides 01/11/2012  . Pulmonary infiltrates 12/22/2011  . Chronic respiratory failure ([place]) 10/16/2011  . Oxygen dependent 10/16/2011  . Peripheral neuropathy 07/07/2011  . Vitamin D deficiency 07/06/2011  . Asthmatic bronchitis 06/11/2011  . Back pain 06/11/2011  . Morbid obesity ([place]) 05/06/2011  . Depression 05/04/2011  . Gastric erosions 12/05/2010  . Hypertension associated with diabetes ([place]) 12/02/2010  . DM (diabetes mellitus) type II controlled, neurological manifestation ([place]) 12/02/2010  . Cigarette smoker 12/02/2010  . Microcytic anemia 09/08/2010  . GERD (gastroesophageal reflux disease) 09/08/2010  . Esophageal dysphagia 09/08/2010     Past Medical History:  Diagnosis Date  . Anemia   . Anxiety   . Arthritis   . Asthmatic bronchitis    normal PFT/ seen by pulmonary no evidence of COPD  . Chronic respiratory failure with hypoxia (HCC)    On 2-3 L of oxygen at home  . COPD (chronic obstructive pulmonary disease) ([place])   . Depression   . Diabetes  mellitus   . Gastric erosions    EGD 08/2010.  [name] GERD (gastroesophageal reflux disease)   . Heavy menses   . High cholesterol   . HTN (hypertension)   . Internal hemorrhoids    Colonoscopy 5/12.  . Low back pain   . Neuropathy   . Shortness of breath   . Sleep apnea   . Tachycardia    never had test done since no insurance     Past Medical History:  Diagnosis Date  . Anemia   . Anxiety   . Arthritis   . Asthmatic bronchitis    normal PFT/ seen by pulmonary no evidence of COPD  . Chronic respiratory failure with hypoxia (HCC)    On 2-3 L of oxygen at home  . COPD (chronic obstructive pulmonary disease) ([place])   . Depression   . Diabetes mellitus   . Gastric erosions    EGD 08/2010.  [name] GERD (gastroesophageal reflux disease)   . Heavy menses   . High cholesterol   . HTN (hypertension)   . Internal hemorrhoids    Colonoscopy 5/12.  . Low back pain   . Neuropathy   . Shortness of breath   . Sleep apnea   . Tachycardia    never had test done since no insurance     Past Surgical History:  Procedure Laterality Date  . CESAREAN SECTION     twice  . CHOLECYSTECTOMY  1990  . COLONOSCOPY  09/16/2010   ZOX:WRUEAV colon/small internal hemorrhoids  . ESOPHAGOGASTRODUODENOSCOPY  09/16/2010   SLF: normal/mild gastritis  . ESOPHAGOGASTRODUODENOSCOPY N/A 10/19/2014   Procedure: ESOPHAGOGASTRODUODENOSCOPY (EGD);  Surgeon: Danie Binder, MD;  Location: [facility] END[facility] SUITE;  Service: Endoscopy;  Laterality: N/A;  830  . HYSTEROSCOPY WITH THERMACHOICE  01/17/2012   Procedure: HYSTEROSCOPY WITH THERMACHOICE;  Surgeon: Florian Buff, MD;  Location: [facility] ORS;  Service: Gynecology;  Laterality: N/A;  total therapy time: 9:13sec  D5W  18 ml in, D5W   51m out, temperture 87degrees  celcious  . KIDNEY SURGERY     as child for blockages  . TONSILLECTOMY    . TUBAL LIGATION    . TYMPANOSTOMY TUBE PLACEMENT    . uterine ablation    . WRIST SURGERY  1995   Lt wrist     Family History  Problem  Relation Age of Onset  . Heart attack Father 74       deceased, etoh use  . Heart disease Father   . Alcohol abuse Father   . Depression Father   . Heart attack Mother 89       deceased  . Diabetes Mother   . Breast cancer Mother   . Heart failure Mother        oxygen dependence, nonsmoker  . Heart disease Mother   . Depression Mother   . Cancer Mother   . Liver disease Maternal Aunt 33       died while on liver transplant list  . Heart attack Maternal Grandmother        premature CAD  . Ulcers Sister   . Hypertension Sister   . Colon cancer Neg Hx      Social History   Substance and Sexual Activity  Drug Use Yes  . Types: Marijuana   Comment: used marijuana last 10/18/2014      Social History   Substance and Sexual Activity  Alcohol Use Yes  . Alcohol/week: 0.0 oz   Comment: social use     Social History   Tobacco Use  Smoking Status Current Every Day Smoker  . Packs/day: 1.00  . Years: 30.00  . Pack years: 30.00  . Types: Cigarettes  Smokeless Tobacco Never Used  Tobacco Comment   using the vapor cigarettes     No outpatient medications have been marked as taking for the 10/01/17 encounter (Office Visit) with Mellody Dance, DO.    Allergies: Codeine; Metformin and related; and Wellbutrin [bupropion hcl]   Review of Systems  Constitutional: Negative for chills, diaphoresis, fever, malaise/fatigue and weight loss.  HENT: Positive for hearing loss and tinnitus. Negative for congestion and sore throat.   Eyes: Negative for blurred vision, double vision and photophobia.       Positive for: change in vision  Respiratory: Positive for cough.   Cardiovascular: Positive for chest pain. Negative for palpitations.  Gastrointestinal: Positive for diarrhea, nausea and vomiting. Negative for blood in stool.       Positive for: nocturia  Genitourinary: Negative for dysuria, frequency and urgency.       Positive: Vaginal discharge  Musculoskeletal:  Positive for joint pain. Negative for myalgias.  Skin: Negative for itching and rash.  Neurological: Positive for headaches. Negative for dizziness, focal weakness and weakness.  Endo/Heme/Allergies: Negative for environmental allergies and polydipsia. Does not bruise/bleed easily.  Psychiatric/Behavioral: Positive for depression. Negative for memory loss. The patient is nervous/anxious. The patient does not have insomnia.      Objective:   Blood pressure 140/88, pulse (!) 102, height '5\' 4"'$  (1.626 m), weight 267 lb 14.4 oz (121.5 kg), SpO2 98 %. Body mass index is 45.98 kg/m. General: Well Developed, well nourished, and in no acute distress.  Neuro: Alert and oriented x3, extra-ocular muscles intact, sensation grossly intact.  HEENT:[place]/AT, PERRLA, neck supple, No carotid bruits Skin: no gross rashes  Cardiac: Regular rhythm, tachycardic. No murmur.  Respiratory: Not using accessory muscles, speaking in full sentences. Diffuse rhonchi and expiratory wheezes throughout Abdominal: not grossly distended Musculoskeletal: Ambulates w/o diff,  FROM * 4 ext.  Vasc: less 2 sec cap RF, warm and pink. Diffuse nonpitting edema bilaterally  Psych:  No HI/SI, judgement and insight good, Euthymic mood. Full Affect.    Recent Results (from the past 2160 hour(s))  Comprehensive metabolic panel     Status: Abnormal   Collection Time: 09/14/17  4:35 PM  Result Value Ref Range   Sodium 135 135 - 145 mmol/L   Potassium 3.7 3.5 - 5.1 mmol/L   Chloride 97 (L) 101 - 111 mmol/L   CO2 27 22 - 32 mmol/L   Glucose, Bld 233 (H) 65 - 99 mg/dL   BUN <5 (L) 6 - 20 mg/dL   Creatinine, Ser 0.67 0.44 - 1.00 mg/dL   Calcium 9.2 8.9 - 10.3 mg/dL   Total Protein 7.3 6.5 - 8.1 g/dL   Albumin 3.6 3.5 - 5.0 g/dL   AST 25 15 - 41 U/L   ALT 23 14 - 54 U/L   Alkaline Phosphatase 64 38 - 126 U/L   Total Bilirubin 0.6 0.3 - 1.2 mg/dL   GFR calc non Af Amer >60 >60 mL/min   GFR calc Af Amer >60 >60 mL/min    Comment:  (NOTE) The eGFR has been calculated using the CKD EPI equation. This calculation has not been validated in all clinical situations. eGFR's persistently <60 mL/min signify possible Chronic Kidney Disease.    Anion gap 11 5 - 15    Comment: Performed at [facility] [address], [place], [place] [address]  CBC with Differential     Status: Abnormal   Collection Time: 09/14/17  4:35 PM  Result Value Ref Range   WBC 13.5 (H) 4.0 - 10.5 K/uL   RBC 5.45 (H) 3.87 - 5.11 MIL/uL   Hemoglobin 14.3 12.0 - 15.0 g/dL   HCT 45.8 36.0 - 46.0 %   MCV 84.0 78.0 - 100.0 fL   MCH 26.2 26.0 - 34.0 pg   MCHC 31.2 30.0 - 36.0 g/dL   RDW 16.2 (H) 11.5 - 15.5 %   Platelets 284 150 - 400 K/uL   Neutrophils Relative % 63 %   Neutro Abs 8.6 (H) 1.7 - 7.7 K/uL   Lymphocytes Relative 28 %   Lymphs Abs 3.8 0.7 - 4.0 K/uL   Monocytes Relative 7 %   Monocytes Absolute 0.9 0.1 - 1.0 K/uL   Eosinophils Relative 1 %   Eosinophils Absolute 0.1 0.0 - 0.7 K/uL   Basophils Relative 0 %   Basophils Absolute 0.1 0.0 - 0.1 K/uL   Immature Granulocytes 1 %   Abs Immature Granulocytes 0.1 0.0 - 0.1 K/uL    Comment: Performed at [facility], [address], [place], [place] [address]  I-Stat beta hCG blood, ED     Status: None   Collection Time: 09/14/17  4:40 PM  Result Value Ref Range   I-stat hCG, quantitative <5.0 <5 mIU/mL   Comment 3            Comment:   GEST. AGE      CONC.  (mIU/mL)   <=1 WEEK        5 - 50     2 WEEKS       50 - 500     3 WEEKS       100 - 10,000     4 WEEKS     1,000 - 30,000        FEMALE AND NON-PREGNANT  FEMALE:     LESS THAN 5 mIU/mL   I-stat troponin, ED     Status: None   Collection Time: 09/14/17  4:40 PM  Result Value Ref Range   Troponin i, poc 0.00 0.00 - 0.08 ng/mL   Comment 3            Comment: Due to the release kinetics of cTnI, a negative result within the first hours of the onset of symptoms does not rule out myocardial infarction with  certainty. If myocardial infarction is still suspected, repeat the test at appropriate intervals.   I-Stat CG4 Lactic Acid, ED     Status: Abnormal   Collection Time: 09/14/17  4:42 PM  Result Value Ref Range   Lactic Acid, Venous 2.28 (HH) 0.5 - 1.9 mmol/L   Comment NOTIFIED PHYSICIAN   I-Stat CG4 Lactic Acid, ED     Status: Abnormal   Collection Time: 09/14/17  7:00 PM  Result Value Ref Range   Lactic Acid, Venous 2.64 (HH) 0.5 - 1.9 mmol/L   Comment NOTIFIED PHYSICIAN   Urinalysis, Routine w reflex microscopic     Status: Abnormal   Collection Time: 09/14/17  7:15 PM  Result Value Ref Range   Color, Urine YELLOW YELLOW   APPearance HAZY (A) CLEAR   Specific Gravity, Urine 1.015 1.005 - 1.030   pH 6.0 5.0 - 8.0   Glucose, UA >=500 (A) NEGATIVE mg/dL   Hgb urine dipstick SMALL (A) NEGATIVE   Bilirubin Urine NEGATIVE NEGATIVE   Ketones, ur NEGATIVE NEGATIVE mg/dL   Protein, ur NEGATIVE NEGATIVE mg/dL   Nitrite NEGATIVE NEGATIVE   Leukocytes, UA NEGATIVE NEGATIVE   RBC / HPF 0-5 0 - 5 RBC/hpf   WBC, UA 0-5 0 - 5 WBC/hpf   Bacteria, UA RARE (A) NONE SEEN   Squamous Epithelial / LPF 6-10 0 - 5   Mucus PRESENT     Comment: Performed at [facility], [address], [place], [place] [address]  I-stat troponin, ED     Status: None   Collection Time: 09/14/17 10:17 PM  Result Value Ref Range   Troponin i, poc 0.01 0.00 - 0.08 ng/mL   Comment 3            Comment: Due to the release kinetics of cTnI, a negative result within the first hours of the onset of symptoms does not rule out myocardial infarction with certainty. If myocardial infarction is still suspected, repeat the test at appropriate intervals.   POCT glycosylated hemoglobin (Hb A1C)     Status: Abnormal   Collection Time: 10/01/17  4:19 PM  Result Value Ref Range   Hemoglobin A1C 10.2 (A) 4.0 - 5.6 %   HbA1c, POC (prediabetic range)  5.7 - 6.4 %   HbA1c, POC (controlled diabetic range)  0.0 - 7.0 %  POCT  UA - Microalbumin     Status: Abnormal   Collection Time: 10/01/17  5:15 PM  Result Value Ref Range   Microalbumin Ur, POC 10 mg/L   Creatinine, POC 50 mg/dL   Albumin/Creatinine Ratio, Urine, POC 30-300

## 2017-10-05 ENCOUNTER — Observation Stay (HOSPITAL_COMMUNITY)
Admission: EM | Admit: 2017-10-05 | Discharge: 2017-10-06 | Disposition: A | Discharge status: Home / Self Care | Payer: Medicare HMO | Attending: Oncology | Admitting: Oncology

## 2017-10-05 ENCOUNTER — Emergency Department (HOSPITAL_COMMUNITY): Payer: Medicare HMO

## 2017-10-05 ENCOUNTER — Encounter (HOSPITAL_COMMUNITY): Payer: Self-pay

## 2017-10-05 ENCOUNTER — Other Ambulatory Visit: Payer: Self-pay

## 2017-10-05 DIAGNOSIS — R51 Headache: Secondary | ICD-10-CM | POA: Diagnosis not present

## 2017-10-05 DIAGNOSIS — F1210 Cannabis abuse, uncomplicated: Secondary | ICD-10-CM | POA: Insufficient documentation

## 2017-10-05 DIAGNOSIS — F17210 Nicotine dependence, cigarettes, uncomplicated: Secondary | ICD-10-CM | POA: Insufficient documentation

## 2017-10-05 DIAGNOSIS — J4489 Other specified chronic obstructive pulmonary disease: Secondary | ICD-10-CM | POA: Diagnosis present

## 2017-10-05 DIAGNOSIS — E1165 Type 2 diabetes mellitus with hyperglycemia: Secondary | ICD-10-CM

## 2017-10-05 DIAGNOSIS — D72829 Elevated white blood cell count, unspecified: Secondary | ICD-10-CM

## 2017-10-05 DIAGNOSIS — E785 Hyperlipidemia, unspecified: Secondary | ICD-10-CM | POA: Diagnosis not present

## 2017-10-05 DIAGNOSIS — R0602 Shortness of breath: Secondary | ICD-10-CM | POA: Insufficient documentation

## 2017-10-05 DIAGNOSIS — E119 Type 2 diabetes mellitus without complications: Secondary | ICD-10-CM | POA: Diagnosis present

## 2017-10-05 DIAGNOSIS — R05 Cough: Secondary | ICD-10-CM | POA: Insufficient documentation

## 2017-10-05 DIAGNOSIS — E1142 Type 2 diabetes mellitus with diabetic polyneuropathy: Secondary | ICD-10-CM | POA: Diagnosis present

## 2017-10-05 DIAGNOSIS — E118 Type 2 diabetes mellitus with unspecified complications: Secondary | ICD-10-CM

## 2017-10-05 DIAGNOSIS — J449 Chronic obstructive pulmonary disease, unspecified: Secondary | ICD-10-CM | POA: Diagnosis not present

## 2017-10-05 DIAGNOSIS — E1159 Type 2 diabetes mellitus with other circulatory complications: Secondary | ICD-10-CM | POA: Diagnosis present

## 2017-10-05 DIAGNOSIS — R079 Chest pain, unspecified: Secondary | ICD-10-CM | POA: Diagnosis not present

## 2017-10-05 DIAGNOSIS — Z7984 Long term (current) use of oral hypoglycemic drugs: Secondary | ICD-10-CM | POA: Diagnosis not present

## 2017-10-05 DIAGNOSIS — R0789 Other chest pain: Secondary | ICD-10-CM

## 2017-10-05 DIAGNOSIS — E1140 Type 2 diabetes mellitus with diabetic neuropathy, unspecified: Secondary | ICD-10-CM | POA: Insufficient documentation

## 2017-10-05 DIAGNOSIS — K219 Gastro-esophageal reflux disease without esophagitis: Secondary | ICD-10-CM | POA: Diagnosis present

## 2017-10-05 DIAGNOSIS — Z9989 Dependence on other enabling machines and devices: Secondary | ICD-10-CM

## 2017-10-05 DIAGNOSIS — Z79899 Other long term (current) drug therapy: Secondary | ICD-10-CM | POA: Insufficient documentation

## 2017-10-05 DIAGNOSIS — Z720 Tobacco use: Secondary | ICD-10-CM

## 2017-10-05 DIAGNOSIS — G4733 Obstructive sleep apnea (adult) (pediatric): Secondary | ICD-10-CM | POA: Diagnosis not present

## 2017-10-05 DIAGNOSIS — I10 Essential (primary) hypertension: Secondary | ICD-10-CM | POA: Diagnosis present

## 2017-10-05 DIAGNOSIS — F17200 Nicotine dependence, unspecified, uncomplicated: Secondary | ICD-10-CM | POA: Diagnosis present

## 2017-10-05 HISTORY — DX: Dependence on supplemental oxygen: Z99.81

## 2017-10-05 HISTORY — DX: Unspecified chronic bronchitis: J42

## 2017-10-05 HISTORY — DX: Other chronic pain: G89.29

## 2017-10-05 HISTORY — DX: Other chest pain: R07.89

## 2017-10-05 HISTORY — DX: Proteinuria, unspecified: R80.9

## 2017-10-05 HISTORY — DX: Personal history of other diseases of the digestive system: Z87.19

## 2017-10-05 HISTORY — DX: Pneumonia, unspecified organism: J18.9

## 2017-10-05 HISTORY — DX: Obstructive sleep apnea (adult) (pediatric): G47.33

## 2017-10-05 HISTORY — DX: Low back pain, unspecified: M54.50

## 2017-10-05 HISTORY — DX: Unspecified osteoarthritis, unspecified site: M19.90

## 2017-10-05 HISTORY — DX: Type 2 diabetes mellitus without complications: E11.9

## 2017-10-05 HISTORY — DX: Dependence on other enabling machines and devices: Z99.89

## 2017-10-05 HISTORY — DX: Headache: R51

## 2017-10-05 HISTORY — DX: Type 2 diabetes mellitus with diabetic polyneuropathy: E11.42

## 2017-10-05 HISTORY — DX: Low back pain: M54.5

## 2017-10-05 HISTORY — DX: Personal history of other medical treatment: Z92.89

## 2017-10-05 HISTORY — DX: Headache, unspecified: R51.9

## 2017-10-05 LAB — BASIC METABOLIC PANEL
Anion gap: 11 (ref 5–15)
BUN: 10 mg/dL (ref 6–20)
CO2: 26 mmol/L (ref 22–32)
Calcium: 9.3 mg/dL (ref 8.9–10.3)
Chloride: 99 mmol/L — ABNORMAL LOW (ref 101–111)
Creatinine, Ser: 0.87 mg/dL (ref 0.44–1.00)
GFR calc Af Amer: >60 mL/min (ref >60)
GFR calc non Af Amer: >60 mL/min (ref >60)
Glucose, Bld: 262 mg/dL — ABNORMAL HIGH (ref 65–99)
Potassium: 4.5 mmol/L (ref 3.5–5.1)
Sodium: 136 mmol/L (ref 135–145)

## 2017-10-05 LAB — I-STAT TROPONIN, ED
Troponin i, poc: 0 ng/mL (ref 0.00–0.08)
Troponin i, poc: 0 ng/mL (ref 0.00–0.08)
Troponin i, poc: 0 ng/mL (ref 0.00–0.08)

## 2017-10-05 LAB — CBC
HCT: 48.9 % — ABNORMAL HIGH (ref 36.0–46.0)
Hemoglobin: 15 g/dL (ref 12.0–15.0)
MCH: 26.3 pg (ref 26.0–34.0)
MCHC: 30.7 g/dL (ref 30.0–36.0)
MCV: 85.6 fL (ref 78.0–100.0)
Platelets: 292 10*3/uL (ref 150–400)
RBC: 5.71 MIL/uL — ABNORMAL HIGH (ref 3.87–5.11)
RDW: 16.1 % — ABNORMAL HIGH (ref 11.5–15.5)
WBC: 14.6 10*3/uL — ABNORMAL HIGH (ref 4.0–10.5)

## 2017-10-05 LAB — TROPONIN I: Troponin I: <0.03 ng/mL (ref <0.03)

## 2017-10-05 LAB — TSH: TSH: 1.961 u[IU]/mL (ref 0.350–4.500)

## 2017-10-05 LAB — GLUCOSE, CAPILLARY
Glucose-Capillary: 255 mg/dL — ABNORMAL HIGH (ref 65–99)
Glucose-Capillary: 289 mg/dL — ABNORMAL HIGH (ref 65–99)

## 2017-10-05 LAB — SAVE SMEAR

## 2017-10-05 MED ORDER — IPRATROPIUM-ALBUTEROL 0.5-2.5 (3) MG/3ML IN SOLN
3.0000 mL | Freq: Four times a day (QID) | RESPIRATORY_TRACT | Status: DC
  Administered 2017-10-05 (×2): 3 mL via RESPIRATORY_TRACT
  Filled 2017-10-05 (×2): qty 3

## 2017-10-05 MED ORDER — ONDANSETRON HCL 4 MG/2ML IJ SOLN: 4.0000 mg | Freq: Four times a day (QID) | INTRAMUSCULAR | Status: DC | PRN

## 2017-10-05 MED ORDER — NITROGLYCERIN 2 % TD OINT
1.0000 [in_us] | TOPICAL_OINTMENT | Freq: Four times a day (QID) | TRANSDERMAL | Status: DC
  Administered 2017-10-05 – 2017-10-06 (×3): 1 [in_us] via TOPICAL
  Filled 2017-10-05: qty 30

## 2017-10-05 MED ORDER — LOSARTAN POTASSIUM 50 MG PO TABS
100.0000 mg | ORAL_TABLET | Freq: Every day | ORAL | Status: DC
  Administered 2017-10-05 – 2017-10-06 (×2): 100 mg via ORAL
  Filled 2017-10-05 (×2): qty 2

## 2017-10-05 MED ORDER — SENNOSIDES-DOCUSATE SODIUM 8.6-50 MG PO TABS: 1.0000 | ORAL_TABLET | Freq: Every evening | ORAL | Status: DC | PRN

## 2017-10-05 MED ORDER — PREGABALIN 50 MG PO CAPS
75.0000 mg | ORAL_CAPSULE | Freq: Two times a day (BID) | ORAL | Status: DC
  Administered 2017-10-05 – 2017-10-06 (×2): 75 mg via ORAL
  Filled 2017-10-05 (×2): qty 1

## 2017-10-05 MED ORDER — ACETAMINOPHEN 325 MG PO TABS
650.0000 mg | ORAL_TABLET | Freq: Four times a day (QID) | ORAL | Status: DC | PRN
  Administered 2017-10-05 – 2017-10-06 (×2): 650 mg via ORAL
  Filled 2017-10-05 (×3): qty 2

## 2017-10-05 MED ORDER — PANTOPRAZOLE SODIUM 40 MG PO TBEC
40.0000 mg | DELAYED_RELEASE_TABLET | Freq: Every day | ORAL | Status: DC
  Administered 2017-10-05 – 2017-10-06 (×2): 40 mg via ORAL
  Filled 2017-10-05 (×3): qty 1

## 2017-10-05 MED ORDER — PREGABALIN 25 MG PO CAPS
75.0000 mg | ORAL_CAPSULE | Freq: Once | ORAL | Status: AC
  Administered 2017-10-05: 75 mg via ORAL
  Filled 2017-10-05 (×3): qty 3

## 2017-10-05 MED ORDER — LOSARTAN POTASSIUM-HCTZ 100-12.5 MG PO TABS: 1.0000 | ORAL_TABLET | Freq: Every day | ORAL | Status: DC

## 2017-10-05 MED ORDER — GI COCKTAIL ~~LOC~~: 30.0000 mL | Freq: Three times a day (TID) | ORAL | Status: DC | PRN

## 2017-10-05 MED ORDER — INSULIN ASPART 100 UNIT/ML ~~LOC~~ SOLN
0.0000 [IU] | Freq: Three times a day (TID) | SUBCUTANEOUS | Status: DC
  Administered 2017-10-05 – 2017-10-06 (×2): 5 [IU] via SUBCUTANEOUS
  Administered 2017-10-06: 3 [IU] via SUBCUTANEOUS

## 2017-10-05 MED ORDER — ONDANSETRON HCL 4 MG PO TABS: 4.0000 mg | ORAL_TABLET | Freq: Four times a day (QID) | ORAL | Status: DC | PRN

## 2017-10-05 MED ORDER — ACETAMINOPHEN 500 MG PO TABS
1000.0000 mg | ORAL_TABLET | Freq: Once | ORAL | Status: AC
  Administered 2017-10-05: 1000 mg via ORAL
  Filled 2017-10-05: qty 2

## 2017-10-05 MED ORDER — KETOROLAC TROMETHAMINE 15 MG/ML IJ SOLN
15.0000 mg | Freq: Once | INTRAMUSCULAR | Status: AC
  Administered 2017-10-05: 15 mg via INTRAVENOUS
  Filled 2017-10-05: qty 1

## 2017-10-05 MED ORDER — ENOXAPARIN SODIUM 120 MG/0.8ML ~~LOC~~ SOLN
1.0000 mg/kg | Freq: Two times a day (BID) | SUBCUTANEOUS | Status: DC
  Administered 2017-10-06: 120 mg via SUBCUTANEOUS
  Filled 2017-10-05 (×3): qty 0.8

## 2017-10-05 MED ORDER — NITROGLYCERIN 2 % TD OINT
1.0000 [in_us] | TOPICAL_OINTMENT | Freq: Four times a day (QID) | TRANSDERMAL | Status: DC
  Filled 2017-10-05: qty 1

## 2017-10-05 MED ORDER — HYDROCHLOROTHIAZIDE 12.5 MG PO CAPS
12.5000 mg | ORAL_CAPSULE | Freq: Every day | ORAL | Status: DC
  Administered 2017-10-05 – 2017-10-06 (×2): 12.5 mg via ORAL
  Filled 2017-10-05 (×2): qty 1

## 2017-10-05 MED ORDER — NICOTINE 21 MG/24HR TD PT24
21.0000 mg | MEDICATED_PATCH | Freq: Every day | TRANSDERMAL | Status: DC
  Administered 2017-10-05 – 2017-10-06 (×2): 21 mg via TRANSDERMAL
  Filled 2017-10-05 (×2): qty 1

## 2017-10-05 MED ORDER — ATORVASTATIN CALCIUM 40 MG PO TABS
40.0000 mg | ORAL_TABLET | Freq: Every day | ORAL | Status: DC
  Administered 2017-10-05: 40 mg via ORAL
  Filled 2017-10-05 (×2): qty 2

## 2017-10-05 MED ORDER — ASPIRIN EC 81 MG PO TBEC
81.0000 mg | DELAYED_RELEASE_TABLET | Freq: Every day | ORAL | Status: DC
  Administered 2017-10-05 – 2017-10-06 (×2): 81 mg via ORAL
  Filled 2017-10-05 (×3): qty 1

## 2017-10-05 MED ORDER — ASPIRIN EC 81 MG PO TBEC: 81.0000 mg | DELAYED_RELEASE_TABLET | Freq: Every day | ORAL | Status: DC

## 2017-10-05 MED ORDER — ACETAMINOPHEN 650 MG RE SUPP: 650.0000 mg | Freq: Four times a day (QID) | RECTAL | Status: DC | PRN

## 2017-10-05 NOTE — ED Triage Notes (Signed)
Patient complains of central chest pain that awoke her this am from sleep 0700, no radiation. States that her blood sugar has been running high and 248 before leaving home. Smoker with COPD. Alert and oriented, describes as a squeezing pressure

## 2017-10-05 NOTE — ED Provider Notes (Addendum)
[name] DE[facility]MENT Provider Note   CSN: [id number] Arrival date & time: 10/05/17  6767     History   Chief Complaint Chief Complaint  Patient presents with  . Chest Pain    HPI [name] is a 52 y.o. female with history of diabetes on insulin, hypertension, hyperlipidemia, tobacco use, sleep apnea on CPAP, obesity, COPD, gastritis is here for chest pain.  Located to the center of her chest, radiates to the middle of her back between shoulder blades.  Onset today at 7 AM, states it woke her up from her sleep.  Described as a constant dull pressure but intensifies to a more sharp quality.  Has had chest pain and shortness of breath with COPD but never this intense.  Associated with a headache and shortness of breath, described as the pain takes my breath away.  Noticed that moving from the bed to the chair to get the chest x-ray made the pain worse.  She has also had the pain while laying down.  Is compliant with CPAP and oxygen at night.  Has a chronic cough and sputum that is unchanged.  Denies fevers, sweats, nausea, vomiting, palpitations, orthopnea, calf pain or leg swelling.  Has chronic epigastric abdominal pain secondary to gastritis that is unchanged.  No history of DVT/PE, recent travel, immobilization, estrogen use, malignancy.  No interventions PTA.  No alleviating factors.  HPI  Past Medical History:  Diagnosis Date  . Anemia   . Anxiety   . Asthmatic bronchitis    normal PFT/ seen by pulmonary no evidence of COPD  . Chronic bronchitis ([place])   . Chronic lower back pain   . Chronic respiratory failure with hypoxia (HCC)    On 2-3 L of oxygen at home  . COPD (chronic obstructive pulmonary disease) ([place])   . Daily headache   . Depression   . Diabetic peripheral neuropathy ([place])   . Gastric erosions    EGD 08/2010.  [name] GERD (gastroesophageal reflux disease)   . Heavy menses   . High cholesterol   . History of blood transfusion    "related to low HgB" (10/05/2017)  . History of hiatal hernia   . HTN (hypertension)   . Increased urinary protein excretion   . Internal hemorrhoids    Colonoscopy 5/12.  . On home oxygen therapy    "5L at night" (10/05/2017)  . OSA on CPAP   . Osteoarthritis    "back" (10/05/2017)  . Pneumonia    "lots of times" (10/05/2017)  . Shortness of breath   . Tachycardia    never had test done since no insurance  . Type II diabetes mellitus [facility])     Patient Active Problem List   Diagnosis Date Noted  . Atypical chest pain 10/05/2017  . COPD with chronic bronchitis ([place]) 10/01/2017  . Mixed diabetic hyperlipidemia associated with type 2 diabetes mellitus ([place]) 10/01/2017  . Tobacco use disorder-current smoker greater than 40-pack-year history- since age 64 1 ppd 10/01/2017  . Tobacco abuse counseling 10/01/2017  . Diabetic peripheral neuropathy ([place]) 10/01/2017  . Iron deficiency anemia 10/01/2017  . Adult attention deficit hyperactivity disorder 01/14/2016  . Diabetes mellitus ([place]) 10/18/2015  . Gastric reflux 10/18/2015  . Dyspepsia 09/23/2014  . Major depressive disorder, recurrent, severe without psychotic features ([place])   . MDD (major depressive disorder), recurrent episode, severe ([place]) 09/13/2014  . Nasal congestion 05/03/2014  . Acute on chronic respiratory failure with hypoxia ([place])   .  Chest pain on respiration 03/25/2014  . Leukocytosis 03/25/2014  . Upper airway cough syndrome 02/28/2014  . Abnormal drug screen 04/11/2013  . Sleep apnea 04/11/2013  . Perforated ear drum 05/22/2012  . Bell's palsy 01/23/2012  . Carpal tunnel syndrome 01/23/2012  . Carpal tunnel syndrome on both sides 01/11/2012  . Pulmonary infiltrates 12/22/2011  . Chronic respiratory failure ([place]) 10/16/2011  . Oxygen dependent 10/16/2011  . Peripheral neuropathy 07/07/2011  . Vitamin D deficiency 07/06/2011  . Asthmatic bronchitis 06/11/2011  . Back pain 06/11/2011  . Morbid obesity ([place])  05/06/2011  . Depression 05/04/2011  . Gastric erosions 12/05/2010  . Hypertension associated with diabetes ([place]) 12/02/2010  . DM (diabetes mellitus) type II controlled, neurological manifestation ([place]) 12/02/2010  . Cigarette smoker 12/02/2010  . Microcytic anemia 09/08/2010  . GERD (gastroesophageal reflux disease) 09/08/2010  . Esophageal dysphagia 09/08/2010    Past Surgical History:  Procedure Laterality Date  . CESAREAN SECTION  1988; 1989  . CHOLECYSTECTOMY OPEN  1990  . COLONOSCOPY  09/16/2010   WUJ:WJXBJY colon/small internal hemorrhoids  . ESOPHAGOGASTRODUODENOSCOPY  09/16/2010   SLF: normal/mild gastritis  . ESOPHAGOGASTRODUODENOSCOPY N/A 10/19/2014   Procedure: ESOPHAGOGASTRODUODENOSCOPY (EGD);  Surgeon: Danie Binder, MD;  Location: [facility] ENDO SUITE;  Service: Endoscopy;  Laterality: N/A;  830  . FRACTURE SURGERY    . HYSTEROSCOPY WITH THERMACHOICE  01/17/2012   Procedure: HYSTEROSCOPY WITH THERMACHOICE;  Surgeon: Florian Buff, MD;  Location: [facility] ORS;  Service: Gynecology;  Laterality: N/A;  total therapy time: 9:13sec  D5W  18 ml in, D5W   59ml out, temperture 87degrees celcious  . KIDNEY SURGERY     as child for blockages  . TONSILLECTOMY    . TUBAL LIGATION  1989  . TYMPANOSTOMY TUBE PLACEMENT Bilateral    "several times when I was a child"  . uterine ablation    . WRIST FRACTURE SURGERY Left 1995     OB History    Gravida  2   Para  2   Term  2   Preterm      AB      Living  2     SAB      TAB      Ectopic      Multiple      Live Births               Home Medications    Prior to Admission medications   Medication Sig Start Date End Date Taking? Authorizing Provider  canagliflozin (INVOKANA) 300 MG TABS tablet Take 1 tablet (300 mg total) by mouth daily before breakfast. 10/01/17 11/30/17 Yes Opalski, Neoma Laming, DO  fluconazole (DIFLUCAN) 200 MG tablet Take 1 tablet (200 mg total) by mouth once a week. 10/01/17  Yes Opalski, Neoma Laming, DO    ibuprofen (ADVIL,MOTRIN) 200 MG tablet Take 800 mg by mouth every 6 (six) hours as needed.   Yes [provider]  losartan-hydrochlorothiazide (HYZAAR) 100-12.5 MG tablet Take 1 tablet by mouth daily. 10/01/17  Yes Opalski, Neoma Laming, DO  pregabalin (LYRICA) 75 MG capsule Take 1 capsule (75 mg total) by mouth 2 (two) times daily. 10/01/17  Yes Opalski, Neoma Laming, DO  Pseudoeph-Doxylamine-DM-APAP (NYQUIL MULTI-SYMPTOM PO) Take by mouth daily as needed.   Yes [provider]  atorvastatin (LIPITOR) 40 MG tablet Take 1 tablet (40 mg total) by mouth daily at 6 PM. 10/06/17   Santos-Sanchez, Merlene Morse, MD  pantoprazole (PROTONIX) 40 MG tablet Take 1 tablet (40 mg total) by mouth  daily. 30 minutes before a meal. 10/06/17 12/05/17  Welford Roche, MD    Family History Family History  Problem Relation Age of Onset  . Heart attack Father 24       deceased, etoh use  . Heart disease Father   . Alcohol abuse Father   . Depression Father   . Heart attack Mother 65       deceased  . Diabetes Mother   . Breast cancer Mother   . Heart failure Mother        oxygen dependence, nonsmoker  . Heart disease Mother   . Depression Mother   . Cancer Mother   . Liver disease Maternal Aunt 28       died while on liver transplant list  . Heart attack Maternal Grandmother        premature CAD  . Ulcers Sister   . Hypertension Sister   . Colon cancer Neg Hx     Social History Social History   Tobacco Use  . Smoking status: Current Every Day Smoker    Packs/day: 1.50    Years: 39.00    Pack years: 58.50    Types: Cigarettes  . Smokeless tobacco: Never Used  Substance Use Topics  . Alcohol use: Yes    Comment: 10/05/2017 "5 - 8 shots tequilia//month"  . Drug use: Yes    Types: Marijuana    Comment: 10/05/2017 "a few times/wk"     Allergies   Codeine; Metformin and related; and Wellbutrin [bupropion hcl]   Review of Systems Review of Systems  Respiratory: Positive for cough  (chronic) and shortness of breath.   Cardiovascular: Positive for chest pain.  All other systems reviewed and are negative.    Physical Exam Updated Vital Signs BP 106/61 (BP Location: Right Arm)   Pulse 97   Temp 98.7 F (37.1 C) (Oral)   Resp 18   Ht 5\' 4"  (1.626 m)   Wt 119.2 kg (262 lb 12.8 oz)   SpO2 92%   BMI 45.11 kg/m   Physical Exam  Constitutional: She appears well-developed and well-nourished.  NAD. Non toxic.   HENT:  Head: Normocephalic and atraumatic.  Nose: Nose normal.  Moist mucous membranes. Tonsils and oropharynx normal  Eyes: Conjunctivae, EOM and lids are normal.  Neck: Trachea normal and normal range of motion.  Trachea midline. No cervical adenopathy  Cardiovascular: Normal rate, regular rhythm, S1 normal, S2 normal and normal heart sounds.  Pulses:      Carotid pulses are 2+ on the right side, and 2+ on the left side.      Radial pulses are 2+ on the right side, and 2+ on the left side.       Dorsalis pedis pulses are 2+ on the right side, and 2+ on the left side.  No anterior/posterior thorax tenderness. No pain with deep inspiration. No pain with active ROM of upper extremities or sitting up on bed. RRR. No LE edema or calf tenderness.   Pulmonary/Chest: Effort normal and breath sounds normal. No respiratory distress. She has no decreased breath sounds. She has no rhonchi.  Faint wheezing to upper lobes, bilaterally A/P  Abdominal: Soft. Bowel sounds are normal. There is tenderness.  Mild epigastric tenderness, chronic per patient.  Neurological: She is alert. GCS eye subscore is 4. GCS verbal subscore is 5. GCS motor subscore is 6.  Skin: Skin is warm and dry. Capillary refill takes less than 2 seconds.  No rash to chest wall  Psychiatric: She has a normal mood and affect. Her speech is normal and behavior is normal. Judgment and thought content normal. Cognition and memory are normal.     ED Treatments / Results  Labs (all labs ordered are  listed, but only abnormal results are displayed) Labs Reviewed  BASIC METABOLIC PANEL - Abnormal; Notable for the following components:      Result Value   Chloride 99 (*)    Glucose, Bld 262 (*)    All other components within normal limits  CBC - Abnormal; Notable for the following components:   WBC 14.6 (*)    RBC 5.71 (*)    HCT 48.9 (*)    RDW 16.1 (*)    All other components within normal limits  LIPID PANEL - Abnormal; Notable for the following components:   Triglycerides 177 (*)    HDL 25 (*)    LDL Cholesterol 132 (*)    All other components within normal limits  COMPREHENSIVE METABOLIC PANEL - Abnormal; Notable for the following components:   Chloride 100 (*)    Glucose, Bld 202 (*)    Calcium 8.6 (*)    Albumin 3.3 (*)    All other components within normal limits  CBC WITH DIFFERENTIAL/PLATELET - Abnormal; Notable for the following components:   WBC 12.3 (*)    RBC 5.19 (*)    RDW 15.9 (*)    All other components within normal limits  GLUCOSE, CAPILLARY - Abnormal; Notable for the following components:   Glucose-Capillary 255 (*)    All other components within normal limits  GLUCOSE, CAPILLARY - Abnormal; Notable for the following components:   Glucose-Capillary 289 (*)    All other components within normal limits  GLUCOSE, CAPILLARY - Abnormal; Notable for the following components:   Glucose-Capillary 217 (*)    All other components within normal limits  GLUCOSE, CAPILLARY - Abnormal; Notable for the following components:   Glucose-Capillary 258 (*)    All other components within normal limits  TROPONIN I  HIV ANTIBODY (ROUTINE TESTING)  TSH  SAVE SMEAR  I-STAT TROPONIN, ED  I-STAT TROPONIN, ED  I-STAT TROPONIN, ED    EKG EKG Interpretation  Date/Time:  Friday October 05 2017 09:37:25 EDT Ventricular Rate:  92 PR Interval:    QRS Duration: 87 QT Interval:  360 QTC Calculation: 446 R Axis:   43 Text Interpretation:  Sinus rhythm Low voltage,  precordial leads Abnormal R-wave progression, late transition No significant change since last tracing Confirmed by Duffy Bruce [phone]) on 10/05/2017 9:40:31 AM   Radiology Dg Chest 2 View  Result Date: 10/05/2017 CLINICAL DATA:  Chest pain and shortness of breath. EXAM: CHEST - 2 VIEW COMPARISON:  Chest x-ray dated Sep 14, 2017. FINDINGS: The heart size and mediastinal contours are within normal limits. Normal pulmonary vascularity. No focal consolidation, pleural effusion, or pneumothorax. Unchanged scarring in the lingula, best appreciated on the lateral view. No acute osseous abnormality. IMPRESSION: No active cardiopulmonary disease. Electronically Signed   By: Titus Dubin M.D.   On: 10/05/2017 09:11    Procedures Procedures (including critical care time)  Medications Ordered in ED Medications  nicotine (NICODERM CQ - dosed in mg/24 hours) patch 21 mg (21 mg Transdermal Patch Applied 10/06/17 0838)  atorvastatin (LIPITOR) tablet 40 mg (40 mg Oral Given 10/05/17 1701)  enoxaparin (LOVENOX) injection 120 mg (120 mg Subcutaneous Given 10/06/17 0524)  nitroGLYCERIN (NITROGLYN) 2 % ointment 1 inch (1 inch Topical Given 10/06/17 1300)  pregabalin (  LYRICA) capsule 75 mg (75 mg Oral Given 10/06/17 0838)  acetaminophen (TYLENOL) tablet 650 mg (650 mg Oral Given 10/06/17 0524)    Or  acetaminophen (TYLENOL) suppository 650 mg ( Rectal See Alternative 10/06/17 0524)  senna-docusate (Senokot-S) tablet 1 tablet (has no administration in time range)  ondansetron (ZOFRAN) tablet 4 mg (has no administration in time range)    Or  ondansetron (ZOFRAN) injection 4 mg (has no administration in time range)  aspirin EC tablet 81 mg (81 mg Oral Given 10/06/17 0836)  pantoprazole (PROTONIX) EC tablet 40 mg (40 mg Oral Given 10/06/17 0838)  gi cocktail (Maalox,Lidocaine,Donnatal) (has no administration in time range)  insulin aspart (novoLOG) injection 0-9 Units (5 Units Subcutaneous Given 10/06/17 1259)    losartan (COZAAR) tablet 100 mg (100 mg Oral Given 10/06/17 0837)    And  hydrochlorothiazide (MICROZIDE) capsule 12.5 mg (12.5 mg Oral Given 10/06/17 0837)  ipratropium-albuterol (DUONEB) 0.5-2.5 (3) MG/3ML nebulizer solution 3 mL (has no administration in time range)  ipratropium-albuterol (DUONEB) 0.5-2.5 (3) MG/3ML nebulizer solution 3 mL (3 mLs Nebulization Given 10/06/17 1300)  acetaminophen (TYLENOL) tablet 1,000 mg (1,000 mg Oral Given 10/05/17 1402)  pregabalin (LYRICA) capsule 75 mg (75 mg Oral Given 10/05/17 1542)  ketorolac (TORADOL) 15 MG/ML injection 15 mg (15 mg Intravenous Given 10/05/17 1701)     Initial Impression / Assessment and Plan / ED Course  I have reviewed the triage vital signs and the nursing notes.  Pertinent labs & imaging results that were available during my care of the patient were reviewed by me and considered in my medical decision making (see chart for details).  Clinical Course as of Oct 06 1833  Fri Oct 05, 2017  1038 WBC(!): 14.6 [CG]  1342 Patient requesting discharge, she does not want to stay and wait in the ER anymore.  Complains of pain to her head, back, feet.  Requesting gabapentin and Tylenol.  I have encouraged her to stay for cardiology evaluation.  States she will wait another 30 minutes.   [CG]    Clinical Course User Index [CG] Kinnie Feil, PA-C    52 year old female here with chest pain.  Pressure-like pain with intermittent sharpness, worse on exertion but also at rest and mild shortness of breath.  She has multiple risk factors including obesity, diabetes with insulin, hypertension, hyperlipidemia, tobacco use, sleep apnea.  She has never had cardiac work-up.  Concern for ACS/CAD, less likely dissection, endocarditis, pericarditis, AAA.  Considered PE however her pain is nonpleuritic and she has no risk factors for PE.  No asymmetric lower extremity swelling or calf tenderness.  Labs, EKG, chest x-ray reviewed and remarkable for  WBC 14.6, hyperglycemia without anion gap. Trop 0.00 > 0.00. Given cardiac risk factors HEART score = 4, intermittent pain, will consult cardiology for recommendations.   1515: Cardiology saw patient recommending admission to medicine for control of COPD and DM, recommending serial trops.   Final Clinical Impressions(s) / ED Diagnoses   Final diagnoses:  Atypical chest pain    ED Discharge Orders        Ordered    atorvastatin (LIPITOR) 40 MG tablet  Daily-1800     10/06/17 1430    Increase activity slowly     10/06/17 1430    Diet - low sodium heart healthy     10/06/17 1430    Discharge instructions    Comments:  [name] were admitted to the hospital due to chest pain.  The blood test and other imaging test that we performed in the hospital were all normal. The ultrasound of your heart was normal.  We do not think the pain in your chest is coming form your heart. The cardiologists would like to see you as an outpatient in their office to follow up with you and for a stress test. Their office will give you a call to set up this appointment. In the meantime, please continue taking your medication as usual.   Some times, pain from your stomach can feel as chest pain. For this, we would like you to take a new medication called protonix. You will take 1 tablet of 40 mg one time a day 30 minutes before a meal. IF taken during or after a meal, the medication will not work.   Please keep your appointment with your PCP in July and call us if you have any questions.   - Dr. Frederico Hamman   10/06/17 1430    Call MD for:  severe uncontrolled pain     10/06/17 1430    Call MD for:  persistant dizziness or light-headedness     10/06/17 1430    Call MD for:  extreme fatigue     10/06/17 1430    pantoprazole (PROTONIX) 40 MG tablet  Daily     10/06/17 1430       Kinnie Feil, PA-C 10/06/17 1835    Kinnie Feil, PA-C 10/10/17 2230    Duffy Bruce, MD 10/11/17  [phone]

## 2017-10-05 NOTE — H&P (Signed)
Date: 10/05/2017               Patient Name:  [name] MRN: [id number]  DOB: 1965/05/25 Age / Sex: 52 y.o., female   PCP: Mellody Dance, DO         Medical Service: Internal Medicine Teaching Service         Attending Physician: Dr. Annia Belt, MD    First Contact: Dr. Frederico Hamman  Pager: [phone]  Second Contact: Dr. Danford Bad  Pager: [phone]       After Hours (After 5p/  First Contact Pager: [phone]  weekends / holidays): Second Contact Pager: [phone]   Chief Complaint: Chest pain   History of Present Illness:  [name] is a 52 yo female with history of COPD on PM oxygen, uncontrolled non-insulin-dependent diabetes complicated by peripheral neuropathy, HTN, OSA on CPAP, and current smoker who presents to the ED with one-day history of acute chest pain. She was in her usual state of health until last night when a sharp pain in the center of the chest and epigastric region woke her from sleep. It was non-radiating, lasted several minutes and was followed by a dull, achy, squeazing sensation. She tried to walk it off without success which prompted her to the ED. On her way to the ED she continued to have intermittent sharp pains that resolved spontaneously after a few minutes. She reports shortness of breath and some diaphoresis during the initial episode. No nausea or vomiting. She denies similar symptoms in the past or history of cardiac history.  She has a history of COPD uses oxygen at the time, but does not report increased dyspnea, increased cough or change in sputum production.  Denies fever, chills, abdominal pain, any ear symptoms.  She does endorse headache and one episode of loose stools in the ED.  She does report family history of heart failure and MI in both mother and father. Does not drink or uses illicit drugs, but does smoke 1.5ppd. No active chest pain when seen.   Of note, she was seen in the ED 3 weeks ago for COPD exacerbation and completed 5  days of prednisone and a course of doxycycline. She also recently established with a family medicine PCP on 6/10 at which time her diabetes and blood pressure medications were restarted.   ED course: Patient was afebrile, tachycardic with HR 120, normotensive and oxygenating well on room air on arrival to the ED.  Blood work remarkable for WBC for 14.6 and hyperglycemia with BG 262.  Troponin negative x3.  EKG with signs of acute ischemia and chest x-ray unremarkable.  Cardiology consulted in the ED who recommended staying overnight for ACS rule out.  She received duonebs x1 and Tylenol for back pain.  Meds:  Current Meds  Medication Sig  . canagliflozin (INVOKANA) 300 MG TABS tablet Take 1 tablet (300 mg total) by mouth daily before breakfast.  . fluconazole (DIFLUCAN) 200 MG tablet Take 1 tablet (200 mg total) by mouth once a week.  [name] ibuprofen (ADVIL,MOTRIN) 200 MG tablet Take 800 mg by mouth every 6 (six) hours as needed.  [name] losartan-hydrochlorothiazide (HYZAAR) 100-12.5 MG tablet Take 1 tablet by mouth daily.  . pregabalin (LYRICA) 75 MG capsule Take 1 capsule (75 mg total) by mouth 2 (two) times daily.  . Pseudoeph-Doxylamine-DM-APAP (NYQUIL MULTI-SYMPTOM PO) Take by mouth daily as needed.     Allergies: Allergies as of 10/05/2017 - Review Complete 10/05/2017  Allergen Reaction  Noted  . Codeine Itching and Nausea Only 09/01/2010  . Metformin and related Diarrhea and Other (See Comments) 08/31/2014  . Wellbutrin [bupropion hcl] Hives 06/09/2011   Past Medical History:  Diagnosis Date  . Anemia   . Anxiety   . Arthritis   . Asthmatic bronchitis    normal PFT/ seen by pulmonary no evidence of COPD  . Chronic respiratory failure with hypoxia (HCC)    On 2-3 L of oxygen at home  . COPD (chronic obstructive pulmonary disease) ([place])   . Depression   . Diabetes mellitus   . Gastric erosions    EGD 08/2010.  [name] GERD (gastroesophageal reflux disease)   . Heavy menses   . High  cholesterol   . HTN (hypertension)   . Internal hemorrhoids    Colonoscopy 5/12.  . Low back pain   . Neuropathy   . Shortness of breath   . Sleep apnea   . Tachycardia    never had test done since no insurance    Family History:  Family History  Problem Relation Age of Onset  . Heart attack Father 39       deceased, etoh use  . Heart disease Father   . Alcohol abuse Father   . Depression Father   . Heart attack Mother 61       deceased  . Diabetes Mother   . Breast cancer Mother   . Heart failure Mother        oxygen dependence, nonsmoker  . Heart disease Mother   . Depression Mother   . Cancer Mother   . Liver disease Maternal Aunt 63       died while on liver transplant list  . Heart attack Maternal Grandmother        premature CAD  . Ulcers Sister   . Hypertension Sister   . Colon cancer Neg Hx     Social History:  Social History   Tobacco Use  . Smoking status: Current Every Day Smoker    Packs/day: 1.00    Years: 30.00    Pack years: 30.00    Types: Cigarettes  . Smokeless tobacco: Never Used  . Tobacco comment: using the vapor cigarettes  Substance Use Topics  . Alcohol use: Yes    Alcohol/week: 0.0 oz    Comment: social use  . Drug use: Yes    Types: Marijuana    Comment: used marijuana last 10/18/2014     Review of Systems: A complete ROS was negative except as per HPI.   Physical Exam: Blood pressure (!) 130/98, pulse 92, temperature 97.9 F (36.6 C), temperature source Oral, resp. rate 14, height 5\' 4"  (1.626 m), weight 264 lb (119.7 kg), SpO2 94 %.  Physical Exam  Constitutional: She is oriented to person, place, and time.  Chronically ill appearing female sitting up in bed in no acute distress   HENT:  Head: Normocephalic and atraumatic.  Mouth/Throat: No oropharyngeal exudate.  Eyes: Pupils are equal, round, and reactive to light. Conjunctivae are normal.  Neck: No JVD present.  Cardiovascular: Normal rate, regular rhythm and  normal heart sounds. Exam reveals no gallop and no friction rub.  No murmur heard. Pulmonary/Chest:  Diffuse expiratory wheezes. No crackles. Able to speak in full sentences. No increased work of breathing noted while on room air.  Abdominal: Soft. Bowel sounds are normal. She exhibits no distension. There is no tenderness.  Musculoskeletal: She exhibits no edema.  Neurological: She is alert and  oriented to person, place, and time.  Skin:  No clubbing noted on upper extremities    EKG: personally reviewed my interpretation is low voltage, NSR, nl intervals, no signs of acute ischemia   CXR: personally reviewed my interpretation is: no acute cardiopulmonary process   Assessment & Plan by Problem: Principal Problem:   Atypical chest pain Active Problems:   GERD (gastroesophageal reflux disease)   Hypertension associated with diabetes ([place])   DM (diabetes mellitus) type II controlled, neurological manifestation (HCC)   Cigarette smoker   Sleep apnea   COPD with chronic bronchitis (HCC)   Tobacco use disorder-current smoker greater than 40-pack-year history- since age 53 1 ppd  Amiya A. Marcelline [name] is a 52 yo female with history of COPD on PM oxygen, uncontrolled non-insulin-dependent diabetes complicated by peripheral neuropathy, HTN, OSA on CPAP, and current smoker who presents to the ED with one-day history of acute chest pain who was admitted for ACS rule out.  She does have diffuse expiratory wheezes on exam, but cardiac exam is unremarkable and she does not appear volume overload tough she is morbidly obese.  Blood work and EKG also unremarkable, and I-stat troponin negative x3. TTE from 2013 unremarkable. Low suspicion but concern for ACS at this time given diabetic, 52 yo female presenting with atypical chest pain. She is in fact at high risk given positive family history, uncontrolled hypertension, uncontrolled diabetes, hyperlipidemia, and current smoking. Would therefore benefit from  further evaluation.   # Atypical chest pain: ACS vs GERD vs anxiety. CXR without acute process. No TTP on exam to suggest MSK etiology. Per cards note, pain radiates to the back. Very low suspicion for aortic dissection in the setting of normal BP. Will evaluate and treat as below.  - Cardiology consulted in the ED, possible coronary CT to [place]  - Telemetry  - Trend troponin  - EKG in AM  - Follow up lipid panel and TSH  - Aspirin 81 mg QD and atorvastatin  - Protonix 40 mg QD   # HTN: uncontrolled, recently started on losartan-HCTZ on 6/10. Currently normotensive.  - Continue home losartan-HCTZ 100-12.5 mg QD   # COPD # OSA  No PFTs. Wheezing on exam, but oxygenating > 95% on room air. Only on rescue inhaler at home which she has not been using more frequently. Does not report any cardinal symptoms. Low suspicion for COPD exacerbation.  - Duonebs q6h PRN  - CPAP QHS   # Chronic leukocytosis: dating back to 2009. Previous blood smears with atypical lymphocytes only. Treated with steroids 3 weeks ago.  - Follow up blood smear  - Will get CBC with diff in AM   # Uncontrolled T2DM: Last A1c 10.2. Non-insulin dependent. Only on Invokana at home, recently started on 6/10.  - SSI-S + CBG monitoring  - Continue home Lyrica 75 mg QD BID and atorva as above  - Hold home Invokana   #  Tobacco abuse: Smoking 1.5ppd  - Nicotine patch 21 mg QD  - Counseled on cessation   F: none  E: monitoring  N: HH/CM diet   VTE ppx: SQ enoxaparin   Code status: Full code, not confirmed on admission   Dispo: Admit patient to Observation with expected length of stay less than 2 midnights.  SignedWelford Roche, MD 10/05/2017, 5:50 PM  Pager: [phone]

## 2017-10-05 NOTE — Progress Notes (Signed)
Patient states that she will call when she is ready for her CPAP

## 2017-10-05 NOTE — Progress Notes (Signed)
ANTICOAGULATION CONSULT NOTE - Initial Consult  Pharmacy Consult for lovenox Indication: chest pain/ACS/NSTEMI  Allergies  Allergen Reactions  . Codeine Itching and Nausea Only  . Metformin And Related Diarrhea and Other (See Comments)    Stomach pain/nausea  . Wellbutrin [Bupropion Hcl] Hives    Patient Measurements: Height: 5\' 4"  (162.6 cm) Weight: 264 lb (119.7 kg) IBW/kg (Calculated) : 54.7 Heparin Dosing Weight:   Vital Signs: Temp: 97.9 F (36.6 C) (06/14 0844) Temp Source: Oral (06/14 0844) BP: 132/63 (06/14 1500) Pulse Rate: 90 (06/14 1500)  Labs: Recent Labs    10/05/17 0846  HGB 15.0  HCT 48.9*  PLT 292  CREATININE 0.87    Estimated Creatinine Clearance: 97.5 mL/min (by C-G formula based on SCr of 0.87 mg/dL).   Medical History: Past Medical History:  Diagnosis Date  . Anemia   . Anxiety   . Arthritis   . Asthmatic bronchitis    normal PFT/ seen by pulmonary no evidence of COPD  . Chronic respiratory failure with hypoxia (HCC)    On 2-3 L of oxygen at home  . COPD (chronic obstructive pulmonary disease) ([place])   . Depression   . Diabetes mellitus   . Gastric erosions    EGD 08/2010.  [name] GERD (gastroesophageal reflux disease)   . Heavy menses   . High cholesterol   . HTN (hypertension)   . Internal hemorrhoids    Colonoscopy 5/12.  . Low back pain   . Neuropathy   . Shortness of breath   . Sleep apnea   . Tachycardia    never had test done since no insurance    Medications:  Infusions:   Assessment: 52 yo female came to ED with CP early this morning that work her from sleep.  Pharmacy consulted for Lovenox dosing.   Patient not on PTA A/C, good renal function, CBC stable, no bleeding noted.   Goal of Therapy:  Anti-Xa level 0.6-1 units/ml 4hrs after LMWH dose given Monitor platelets by anticoagulation protocol: Yes   Plan:  Lovenox 120 mg SQ Q12h (1mg /kg) Monitor for s/sx of bleeding Monitor anti-Xa levels as  appropriate  Leroy Libman, PharmD Pharmacy Resident

## 2017-10-05 NOTE — Consult Note (Addendum)
The patient has been seen in conjunction with Charlotta Newton, NP-C. All aspects of care have been considered and discussed. The patient has been personally interviewed, examined, and all clinical data has been reviewed.   Heavy smoker with multiple other high risk comorbidities including uncontrolled diabetes mellitus, hyperlipidemia with xanthelasma, hypertension, obesity, sleep apnea, and positive family history of CAD who came to the emergency room for evaluation of central sharp chest discomfort radiating through to the back.  Severe intensity for 30 minutes and now lingering continued low-grade discomfort.  On exam the patient is obese, appears agitated, states that agitation is related to inability to smoke cigarettes while in the emergency room, wheezing heard on expiration bilaterally, and normal cardiac auscultation.  EKG and cardiac markers are normal.  Chest discomfort without objective evidence of myocardial injury/infarction.  High risk due to uncontrolled diabetes, smoking, and other risk factors as noted above.  Recommend admission to rule out ischemia/unstable coronary syndrome, therapeutic subcutaneous Lovenox, serial EKG and troponin, add long-acting nitrate therapy, and if rules out for acute infarction, would favor a coronary CTA with cardiac morphology to define clinical subset.  Obviously, if markers or EKG suggest injury/ischemia, would need to have coronary angiography.  Because the patient is wheezing and has several other uncontrolled medical comorbidities, we would recommend admitting to the medicine service.   Cardiology Consultation:   Patient ID: JUDITH DEMPS; [id number]; [birth year]   Admit date: 10/05/2017 Date of Consult: 10/05/2017  Primary Care Provider: Patient, No Pcp Per Primary Cardiologist: Sinclair Grooms, MD - New   Patient Profile:   AMIREE NO is a 52 y.o. female with a hx of diabetes type 2, Hypertension, Hyperlipidemia,  tobacco use, sleep apnea on CPAP, obesity, COPD, and gastritis who is being seen today for the evaluation of chest pain at the request of Dr. Ellender Hose.  History of Present Illness:   Ms. Marcelline Deist denies any prior cardiac history. She did have cardiac "imaging" last year at a hospital in [place], [place] which sounds like it was an echocardiogram and she was told it was normal. An echo done here in 2013 was also normal. She is a smoker, ~1.5 PPD since age 52. She used to live near here but moved to [place] for several years and has recently moved back to be with her daughter and grandchildren. She says that she had been under a lot of stress with living with her daughter. She went without primary care or meds for at least 9 months and reports that her blood sugars have been running very high. She just saw a PCP on Monday and has been restarted on Invokana. She has sleep apnea and uses a CPAP every night with oxygen.   She came to the ED 09/14/17 with COPD exacerbation. She was treated with antibiotics and prednisone. She also has history of a past hospitalization in ICU for pneumonia.   This morning she was awakened with severe, sharp central chest pain that went through to her back between her shoulder blades. This was associated with shortness of breath which she says she often has, related to COPD, and also lightheadedness. The pain stayed intense for about 30 minutes then became a dull ache with occasional sharp pains. It is not worsened with deep breathing or movement. It does feel tender to palpation along with epigastric tenderness. It is still a dull ache. She has taken protonix in the past but has not been taking it lately and has not  been having heart burn. She says that this feels a little like when her gallbladder acted up but her gallbladder has been removed.   She has a significant family cardiac history with her mother having CHF and died of MI at age 31, father died of MI at age 52, sister  likely has CHF- she is not sure.   She is anxious to go home. She feels "agitated" because she really wants a cigarette.   Past Medical History:  Diagnosis Date  . Anemia   . Anxiety   . Arthritis   . Asthmatic bronchitis    normal PFT/ seen by pulmonary no evidence of COPD  . Chronic respiratory failure with hypoxia (HCC)    On 2-3 L of oxygen at home  . COPD (chronic obstructive pulmonary disease) ([facility])   . Depression   . Diabetes mellitus   . Gastric erosions    EGD 08/2010.  [name] GERD (gastroesophageal reflux disease)   . Heavy menses   . High cholesterol   . HTN (hypertension)   . Internal hemorrhoids    Colonoscopy 5/12.  . Low back pain   . Neuropathy   . Shortness of breath   . Sleep apnea   . Tachycardia    never had test done since no insurance    Past Surgical History:  Procedure Laterality Date  . CESAREAN SECTION     twice  . CHOLECYSTECTOMY  1990  . COLONOSCOPY  09/16/2010   IOX:BDZHGD colon/small internal hemorrhoids  . ESOPHAGOGASTRODUODENOSCOPY  09/16/2010   SLF: normal/mild gastritis  . ESOPHAGOGASTRODUODENOSCOPY N/A 10/19/2014   Procedure: ESOPHAGOGASTRODUODENOSCOPY (EGD);  Surgeon: Danie Binder, MD;  Location: [facility];  Service: Endoscopy;  Laterality: N/A;  830  . HYSTEROSCOPY WITH THERMACHOICE  01/17/2012   Procedure: HYSTEROSCOPY WITH THERMACHOICE;  Surgeon: Florian Buff, MD;  Location: [facility];  Service: Gynecology;  Laterality: N/A;  total therapy time: 9:13sec  D5W  18 ml in, D5W   96ml out, temperture 87degrees celcious  . KIDNEY SURGERY     as child for blockages  . TONSILLECTOMY    . TUBAL LIGATION    . TYMPANOSTOMY TUBE PLACEMENT    . uterine ablation    . WRIST SURGERY  1995   Lt wrist     Home Medications:  Prior to Admission medications   Medication Sig Start Date End Date Taking? Authorizing Provider  canagliflozin (INVOKANA) 300 MG TABS tablet Take 1 tablet (300 mg total) by mouth daily before breakfast. 10/01/17 11/30/17 Yes  Opalski, Neoma Laming, DO  fluconazole (DIFLUCAN) 200 MG tablet Take 1 tablet (200 mg total) by mouth once a week. 10/01/17  Yes Opalski, Neoma Laming, DO  ibuprofen (ADVIL,MOTRIN) 200 MG tablet Take 800 mg by mouth every 6 (six) hours as needed.   Yes [provider]  losartan-hydrochlorothiazide (HYZAAR) 100-12.5 MG tablet Take 1 tablet by mouth daily. 10/01/17  Yes Opalski, Neoma Laming, DO  pregabalin (LYRICA) 75 MG capsule Take 1 capsule (75 mg total) by mouth 2 (two) times daily. 10/01/17  Yes Opalski, Neoma Laming, DO  Pseudoeph-Doxylamine-DM-APAP (NYQUIL MULTI-SYMPTOM PO) Take by mouth daily as needed.   Yes [provider]  atorvastatin (LIPITOR) 10 MG tablet Take 1 tablet (10 mg total) by mouth at bedtime. 10/01/17   Mellody Dance, DO    Inpatient Medications: Scheduled Meds:  Continuous Infusions:  PRN Meds:   Allergies:    Allergies  Allergen Reactions  . Codeine Itching and Nausea Only  . Metformin And Related  Diarrhea and Other (See Comments)    Stomach pain/nausea  . Wellbutrin [Bupropion Hcl] Hives    Social History:   Social History   Socioeconomic History  . Marital status: Single    Spouse name: Not on file  . Number of children: 2  . Years of education: Not on file  . Highest education level: Not on file  Occupational History  . Occupation: unemployed  Social Needs  . Financial resource strain: Not on file  . Food insecurity:    Worry: Not on file    Inability: Not on file  . Transportation needs:    Medical: Not on file    Non-medical: Not on file  Tobacco Use  . Smoking status: Current Every Day Smoker    Packs/day: 1.00    Years: 30.00    Pack years: 30.00    Types: Cigarettes  . Smokeless tobacco: Never Used  . Tobacco comment: using the vapor cigarettes  Substance and Sexual Activity  . Alcohol use: Yes    Alcohol/week: 0.0 oz    Comment: social use  . Drug use: Yes    Types: Marijuana    Comment: used marijuana last 10/18/2014   .  Sexual activity: Never    Birth control/protection: Surgical  Lifestyle  . Physical activity:    Days per week: Not on file    Minutes per session: Not on file  . Stress: Not on file  Relationships  . Social connections:    Talks on phone: Not on file    Gets together: Not on file    Attends religious service: Not on file    Active member of club or organization: Not on file    Attends meetings of clubs or organizations: Not on file    Relationship status: Not on file  . Intimate partner violence:    Fear of current or ex partner: Not on file    Emotionally abused: Not on file    Physically abused: Not on file    Forced sexual activity: Not on file  Other Topics Concern  . Not on file  Social History Narrative  . Not on file    Family History:    Family History  Problem Relation Age of Onset  . Heart attack Father 40       deceased, etoh use  . Heart disease Father   . Alcohol abuse Father   . Depression Father   . Heart attack Mother 15       deceased  . Diabetes Mother   . Breast cancer Mother   . Heart failure Mother        oxygen dependence, nonsmoker  . Heart disease Mother   . Depression Mother   . Cancer Mother   . Liver disease Maternal Aunt 23       died while on liver transplant list  . Heart attack Maternal Grandmother        premature CAD  . Ulcers Sister   . Hypertension Sister   . Colon cancer Neg Hx      ROS:  Please see the history of present illness.   All other ROS reviewed and negative.     Physical Exam/Data:   Vitals:   10/05/17 1230 10/05/17 1315 10/05/17 1330 10/05/17 1400  BP: (!) 117/59 119/66 120/68 109/71  Pulse: 96 90 91 95  Resp: 11     Temp:      TempSrc:      SpO2: 95% 93%  94% 93%  Weight:      Height:       No intake or output data in the 24 hours ending 10/05/17 1432 Filed Weights   10/05/17 0847  Weight: 264 lb (119.7 kg)   Body mass index is 45.32 kg/m.  General:  Obese female, in no acute distress HEENT:  normal Lymph: no adenopathy Neck: no JVD Endocrine:  No thryomegaly Vascular: No carotid bruits; FA pulses 2+ bilaterally without bruits  Cardiac:  normal S1, S2; RRR; no murmur  Lungs:  Diffuse wheezing Abd: soft, tender to palpation in epigastrium Ext: no edema Musculoskeletal:  No deformities, BUE and BLE strength normal and equal Skin: warm and dry  Neuro:  CNs 2-12 intact, no focal abnormalities noted Psych:  Normal affect   EKG:  The EKG was personally reviewed and demonstrates:  Sinus rhythm, 92 bpm, Low voltage, precordial leads, Abnormal R-wave progression, late transition Telemetry:  Telemetry was personally reviewed and demonstrates:  Sinus rhythm in the 80's-90's  Relevant CV Studies:  Echocardiogram 02/19/12 Study Conclusions  - Left ventricle: The cavity size was normal. There was mildconcentric hypertrophy. Systolic function was normal. Theestimated ejection fraction was in the range of 60% to65%. Wall motion was normal; there were no regional wallmotion abnormalities. - Mitral valve: Calcified annulus. - Atrial septum: No defect or patent foramen ovale was identified.  Laboratory Data:  Chemistry Recent Labs  Lab 10/05/17 0846  NA 136  K 4.5  CL 99*  CO2 26  GLUCOSE 262*  BUN 10  CREATININE 0.87  CALCIUM 9.3  GFRNONAA >60  GFRAA >60  ANIONGAP 11    No results for input(s): PROT, ALBUMIN, AST, ALT, ALKPHOS, BILITOT in the last 168 hours. Hematology Recent Labs  Lab 10/05/17 0846  WBC 14.6*  RBC 5.71*  HGB 15.0  HCT 48.9*  MCV 85.6  MCH 26.3  MCHC 30.7  RDW 16.1*  PLT 292   Cardiac EnzymesNo results for input(s): TROPONINI in the last 168 hours.  Recent Labs  Lab 10/05/17 0912 10/05/17 1102 10/05/17 1402  TROPIPOC 0.00 0.00 0.00    BNPNo results for input(s): BNP, PROBNP in the last 168 hours.  DDimer No results for input(s): DDIMER in the last 168 hours.  Radiology/Studies:  Dg Chest 2 View  Result Date:  10/05/2017 CLINICAL DATA:  Chest pain and shortness of breath. EXAM: CHEST - 2 VIEW COMPARISON:  Chest x-ray dated Sep 14, 2017. FINDINGS: The heart size and mediastinal contours are within normal limits. Normal pulmonary vascularity. No focal consolidation, pleural effusion, or pneumothorax. Unchanged scarring in the lingula, best appreciated on the lateral view. No acute osseous abnormality. IMPRESSION: No active cardiopulmonary disease. Electronically Signed   By: Titus Dubin M.D.   On: 10/05/2017 09:11    Assessment and Plan:   Chest pain -Mostly atypical, sharp central chest pain going through to her back, woke her this am, lasted ~30 minutes then became a dull ache which has persisted, tender to palpation at the chest and epigastric area, and some typical features-shortness of breath and lightheadedness.  -CVD risk factors are significant including long heavy smoking history, hypertension, Uncontrolled DM, hyperlipidemia, strong family history.  -Troponins have been 0.002 -Chest x-ray shows no active cardiopulmonary disease -EKG is without acute ischemic changes -The patient has significant risk factors but no objective evidence of myocardial ischemia. She continues to have mild dull chest pain.  -Advise for medicine to admit pt given her active wheezing and uncontrolled diabetes.  -We  will add nitro paste, start therapeutic lovenox and trend troponins through the night. Will check EKG in the morning. If enzymes remain negative, no change in EKG and pain controlled, can discharge tomorrow and plan for outpatient cardiac CTA. Perfusion study is not the best option given her body habitus. Will not add BB at this time with active wheezing.  -Possible chest pain related to GERD. Pt has hx of gastritis and has been off her protonix.  Hypertension -Home medications include losartan-hydrochlorothiazide 100-12.5 mg daily  Hyperlipidemia -On Atorvastatin 10 mg daily. Will check lipid panel and  increase atorvastatin to 40 mg for high intensity.   Diabetes type 2 -Hgb A1c 10.2. Poorly controlled blood sugars -Pt has been off meds for a while. Just started on Invokana as she got established with a PCP on Monday.  -She will need better diabetes control to reduce her cardiac risk.  Tobacco abuse -Smokes ~1.5 PPD since age 63. She reports being under a lot of stress and therefore smoking more. She does not feel like she can consider quitting at this time.  -Will provide nicotine patch.   COPD -Advanced. Uses oxygen at night. Uses nebulizer.  -She is actively wheezing. Management per IM.   OSA -On CPAP with oxygen at night. Reports compliance, can't sleep without it.   For questions or updates, please contact [facility] Please consult [email or web address] for contact info under Cardiology/STEMI.   Signed, Daune Perch, NP  10/05/2017 2:32 PM

## 2017-10-05 NOTE — ED Notes (Signed)
Attempted report x1. 

## 2017-10-06 ENCOUNTER — Observation Stay (HOSPITAL_BASED_OUTPATIENT_CLINIC_OR_DEPARTMENT_OTHER): Payer: Medicare HMO

## 2017-10-06 ENCOUNTER — Other Ambulatory Visit: Payer: Self-pay | Admitting: Cardiology

## 2017-10-06 DIAGNOSIS — F17210 Nicotine dependence, cigarettes, uncomplicated: Secondary | ICD-10-CM | POA: Diagnosis not present

## 2017-10-06 DIAGNOSIS — J449 Chronic obstructive pulmonary disease, unspecified: Secondary | ICD-10-CM

## 2017-10-06 DIAGNOSIS — E1143 Type 2 diabetes mellitus with diabetic autonomic (poly)neuropathy: Secondary | ICD-10-CM | POA: Diagnosis not present

## 2017-10-06 DIAGNOSIS — E1159 Type 2 diabetes mellitus with other circulatory complications: Secondary | ICD-10-CM | POA: Diagnosis not present

## 2017-10-06 DIAGNOSIS — R079 Chest pain, unspecified: Secondary | ICD-10-CM

## 2017-10-06 DIAGNOSIS — I10 Essential (primary) hypertension: Secondary | ICD-10-CM | POA: Diagnosis not present

## 2017-10-06 DIAGNOSIS — E1140 Type 2 diabetes mellitus with diabetic neuropathy, unspecified: Secondary | ICD-10-CM | POA: Diagnosis not present

## 2017-10-06 DIAGNOSIS — R0789 Other chest pain: Principal | ICD-10-CM

## 2017-10-06 DIAGNOSIS — R51 Headache: Secondary | ICD-10-CM | POA: Diagnosis not present

## 2017-10-06 DIAGNOSIS — R0602 Shortness of breath: Secondary | ICD-10-CM | POA: Diagnosis not present

## 2017-10-06 DIAGNOSIS — R05 Cough: Secondary | ICD-10-CM | POA: Diagnosis not present

## 2017-10-06 DIAGNOSIS — F17200 Nicotine dependence, unspecified, uncomplicated: Secondary | ICD-10-CM

## 2017-10-06 DIAGNOSIS — D72829 Elevated white blood cell count, unspecified: Secondary | ICD-10-CM | POA: Diagnosis not present

## 2017-10-06 DIAGNOSIS — K219 Gastro-esophageal reflux disease without esophagitis: Secondary | ICD-10-CM

## 2017-10-06 DIAGNOSIS — F1210 Cannabis abuse, uncomplicated: Secondary | ICD-10-CM | POA: Diagnosis not present

## 2017-10-06 DIAGNOSIS — G4733 Obstructive sleep apnea (adult) (pediatric): Secondary | ICD-10-CM | POA: Diagnosis not present

## 2017-10-06 LAB — LIPID PANEL
Cholesterol: 192 mg/dL (ref 0–200)
HDL: 25 mg/dL — ABNORMAL LOW (ref >40)
LDL Cholesterol: 132 mg/dL — ABNORMAL HIGH (ref 0–99)
Total CHOL/HDL Ratio: 7.7 RATIO
Triglycerides: 177 mg/dL — ABNORMAL HIGH (ref <150)
VLDL: 35 mg/dL (ref 0–40)

## 2017-10-06 LAB — CBC WITH DIFFERENTIAL/PLATELET
Abs Immature Granulocytes: 0.1 10*3/uL (ref 0.0–0.1)
Basophils Absolute: 0.1 10*3/uL (ref 0.0–0.1)
Basophils Relative: 0 %
Eosinophils Absolute: 0.3 10*3/uL (ref 0.0–0.7)
Eosinophils Relative: 2 %
HCT: 43.9 % (ref 36.0–46.0)
Hemoglobin: 13.6 g/dL (ref 12.0–15.0)
Immature Granulocytes: 1 %
Lymphocytes Relative: 33 %
Lymphs Abs: 4 10*3/uL (ref 0.7–4.0)
MCH: 26.2 pg (ref 26.0–34.0)
MCHC: 31 g/dL (ref 30.0–36.0)
MCV: 84.6 fL (ref 78.0–100.0)
Monocytes Absolute: 0.7 10*3/uL (ref 0.1–1.0)
Monocytes Relative: 6 %
Neutro Abs: 7.2 10*3/uL (ref 1.7–7.7)
Neutrophils Relative %: 58 %
Platelets: 258 10*3/uL (ref 150–400)
RBC: 5.19 MIL/uL — ABNORMAL HIGH (ref 3.87–5.11)
RDW: 15.9 % — ABNORMAL HIGH (ref 11.5–15.5)
WBC: 12.3 10*3/uL — ABNORMAL HIGH (ref 4.0–10.5)

## 2017-10-06 LAB — COMPREHENSIVE METABOLIC PANEL
ALT: 22 U/L (ref 14–54)
AST: 17 U/L (ref 15–41)
Albumin: 3.3 g/dL — ABNORMAL LOW (ref 3.5–5.0)
Alkaline Phosphatase: 58 U/L (ref 38–126)
Anion gap: 9 (ref 5–15)
BUN: 14 mg/dL (ref 6–20)
CO2: 27 mmol/L (ref 22–32)
Calcium: 8.6 mg/dL — ABNORMAL LOW (ref 8.9–10.3)
Chloride: 100 mmol/L — ABNORMAL LOW (ref 101–111)
Creatinine, Ser: 0.87 mg/dL (ref 0.44–1.00)
GFR calc Af Amer: >60 mL/min (ref >60)
GFR calc non Af Amer: >60 mL/min (ref >60)
Glucose, Bld: 202 mg/dL — ABNORMAL HIGH (ref 65–99)
Potassium: 3.7 mmol/L (ref 3.5–5.1)
Sodium: 136 mmol/L (ref 135–145)
Total Bilirubin: 0.6 mg/dL (ref 0.3–1.2)
Total Protein: 6.6 g/dL (ref 6.5–8.1)

## 2017-10-06 LAB — GLUCOSE, CAPILLARY
Glucose-Capillary: 217 mg/dL — ABNORMAL HIGH (ref 65–99)
Glucose-Capillary: 258 mg/dL — ABNORMAL HIGH (ref 65–99)

## 2017-10-06 LAB — ECHOCARDIOGRAM COMPLETE
Height: 64 in
Weight: 4204.8 oz

## 2017-10-06 LAB — HIV ANTIBODY (ROUTINE TESTING W REFLEX): HIV Screen 4th Generation wRfx: NONREACTIVE

## 2017-10-06 MED ORDER — IPRATROPIUM-ALBUTEROL 0.5-2.5 (3) MG/3ML IN SOLN
3.0000 mL | Freq: Three times a day (TID) | RESPIRATORY_TRACT | Status: DC
  Administered 2017-10-06: 3 mL via RESPIRATORY_TRACT
  Filled 2017-10-06: qty 3

## 2017-10-06 MED ORDER — IPRATROPIUM-ALBUTEROL 0.5-2.5 (3) MG/3ML IN SOLN
3.0000 mL | RESPIRATORY_TRACT | Status: DC | PRN
Start: 2017-10-06 — End: 2017-10-06

## 2017-10-06 MED ORDER — PANTOPRAZOLE SODIUM 40 MG PO TBEC: 40.0000 mg | DELAYED_RELEASE_TABLET | Freq: Every day | ORAL | 1 refills | Status: DC

## 2017-10-06 MED ORDER — ATORVASTATIN CALCIUM 40 MG PO TABS: 40.0000 mg | ORAL_TABLET | Freq: Every day | ORAL | 0 refills | Status: DC

## 2017-10-06 NOTE — Plan of Care (Signed)

## 2017-10-06 NOTE — Progress Notes (Addendum)
       Dr Thompson Caul consult note reviewed. Admitted with primarily atypical chest pain in setting of wheezing/pulmonary bronchospasm. Multiple CAD risk factors, no objective evidence of ischemia by EKG or enzymes. From talking with her today she describes a constant tightness midchest ongoing over 24 hours with intermittent sharp pains, not consistent with cardiac ischemia.  From original consult note plans would recommend coronary CTA to further evaluate due to her body habitus, testing as outpatient would be fine. We will arrange outpatient coronary CTA and cardiac follow up, no further cardiology recs at this time  We will f/u echo being done right now, if no acute findings we will arrange outpatient coronary CTA and outpatient f/u.   Zandra Abts MD  Signed, Carlyle Dolly, MD  10/06/2017, 9:38 AM

## 2017-10-06 NOTE — Discharge Summary (Signed)
Name: [name] MRN: [id number] DOB: 1965-11-18 52 y.o. PCP: Mellody Dance, DO  Date of Admission: 10/05/2017  8:41 AM Date of Discharge: 10/06/2017 Attending Physician: Annia Belt, MD  Discharge Diagnosis: 1. Atypical chest pain  2. Chronic leukocytosis 3. HTN  4. COPD 5. OSA 6. T2DM  7. Tobacco abuse   Discharge Medications: Allergies as of 10/06/2017      Reactions   Codeine Itching, Nausea Only   Metformin And Related Diarrhea, Other (See Comments)   Stomach pain/nausea   Wellbutrin [bupropion Hcl] Hives      Medication List    TAKE these medications   atorvastatin 40 MG tablet Commonly known as:  LIPITOR Take 1 tablet (40 mg total) by mouth daily at 6 PM. What changed:    medication strength  how much to take  when to take this   canagliflozin 300 MG Tabs tablet Commonly known as:  INVOKANA Take 1 tablet (300 mg total) by mouth daily before breakfast.   fluconazole 200 MG tablet Commonly known as:  DIFLUCAN Take 1 tablet (200 mg total) by mouth once a week.   ibuprofen 200 MG tablet Commonly known as:  ADVIL,MOTRIN Take 800 mg by mouth every 6 (six) hours as needed.   losartan-hydrochlorothiazide 100-12.5 MG tablet Commonly known as:  HYZAAR Take 1 tablet by mouth daily.   NYQUIL MULTI-SYMPTOM PO Take by mouth daily as needed.   pantoprazole 40 MG tablet Commonly known as:  PROTONIX Take 1 tablet (40 mg total) by mouth daily. 30 minutes before a meal.   pregabalin 75 MG capsule Commonly known as:  LYRICA Take 1 capsule (75 mg total) by mouth 2 (two) times daily.       Disposition and follow-up:   [name] was discharged from [facility] in Stable condition.  At the hospital follow up visit please address:  1.  Please assess for ongoing chest pain. Please ensure patient has follow up schedule with cardiology and plan for coronary CTA. Please continue to encourage smoking cessation.   2.   Labs / imaging needed at time of follow-up: None   3.  Pending labs/ test needing follow-up: None   Follow-up Appointments: Follow-up Information    Belva Crome, MD Follow up.   Specialty:  Cardiology Why:  the office will call with date and time.  this will be after cardiac cat scan.  Contact information: [address] [address] [place] by problem list:  1. Atypical chest pain: Patient presented with a 1-day history of intermittent sharp pain in the center of the chest. Her EKG at the time of admission showed no signs of acute ischemia and troponin was negative x3. TTE unremarkable, report below. Cardiology was consulted in the ED who recommended a coronary CTA as an outpatient for further evaluation given multiple comorbidities listed below. She was discharged home in stable condition and with cardiology follow up. She was given a prescription for Protonix 40 mg daily and educated on how to take it as she has a history of GERD.   2. Chronic leukocytosis: Patient has a history of chronic leukocytosis dating back to 2008. Low suspicion for myeloproliferative disorder as remaining cell lines. This was thought to be secondary to chronic inflammation associated with cigarette smoking given her long history of tobacco abuse. But differential diagnosis also includes: chronic inflammatory disorders, collagen vascular  disease, TB, and other chronic infections such as herpes genitalis, and dental disease.   3. HTN: Patient recently started on losartan-HCTZ 100-125. mg QD by PCP on 6/10. This medication was continued during this admission and she remains normotensive.   4. COPD: Patient noted to have wheezing on exam on day of admission which resolved with duoneb treatments. She was discharged on her home rescue inhaler. Recommend obtaining PFTs and starting a LAMA or ICS if indicated.    5. OSA: Patient was continued on home CPAP.     6. T2DM: Patient has a history of uncontrolled T2DM and had been off medications for a while until 6/10 when she established with a PCP and was started on Invokana. This was held during admission and she was placed on sliding scale insulin. Home Invokana was resumed at the time of discharge.   7. Tobacco abuse: Patient has a long history of tobacco abuse and smokes 1.5 packs per day. She was placed on a nicotine patch during this admission. She was counseled on smoking cessation.    Discharge Vitals:   BP 106/61 (BP Location: Right Arm)   Pulse 97   Temp 98.7 F (37.1 C) (Oral)   Resp 18   Ht 5\' 4"  (1.626 m)   Wt 262 lb 12.8 oz (119.2 kg)   SpO2 92%   BMI 45.11 kg/m   Pertinent Labs, Studies, and Procedures:   CBC Latest Ref Rng & Units 10/06/2017 10/05/2017 09/14/2017  WBC 4.0 - 10.5 K/uL 12.3(H) 14.6(H) 13.5(H)  Hemoglobin 12.0 - 15.0 g/dL 13.6 15.0 14.3  Hematocrit 36.0 - 46.0 % 43.9 48.9(H) 45.8  Platelets 150 - 400 K/uL 258 292 284   BMP Latest Ref Rng & Units 10/06/2017 10/05/2017 09/14/2017  Glucose 65 - 99 mg/dL 202(H) 262(H) 233(H)  BUN 6 - 20 mg/dL 14 10 <5(L)  Creatinine 0.44 - 1.00 mg/dL 0.87 0.87 0.67  Sodium 135 - 145 mmol/L 136 136 135  Potassium 3.5 - 5.1 mmol/L 3.7 4.5 3.7  Chloride 101 - 111 mmol/L 100(L) 99(L) 97(L)  CO2 22 - 32 mmol/L 27 26 27   Calcium 8.9 - 10.3 mg/dL 8.6(L) 9.3 9.2   CXR 10/05/2017: FINDINGS: The heart size and mediastinal contours are within normal limits. Normal pulmonary vascularity. No focal consolidation, pleural effusion, or pneumothorax. Unchanged scarring in the lingula, best appreciated on the lateral view. No acute osseous abnormality.  TTE 10/06/2017: Study Conclusions - Left ventricle: The cavity size was normal. There was mild   concentric hypertrophy. Systolic function was normal. The   estimated ejection fraction was in the range of 55% to 60%. Wall   motion was normal; there were no regional wall motion   abnormalities.  Doppler parameters are consistent with abnormal   left ventricular relaxation (grade 1 diastolic dysfunction). - Aortic valve: Transvalvular velocity was within the normal range.   There was no stenosis. There was no regurgitation. Valve area   (VTI): 1.8 cm^2. Valve area (Vmax): 1.94 cm^2. Valve area   (Vmean): 1.98 cm^2. - Mitral valve: Transvalvular velocity was within the normal range.   There was no evidence for stenosis. There was trivial   regurgitation. - Right ventricle: The cavity size was normal. Wall thickness was   normal. Systolic function was normal. - Atrial septum: No defect or patent foramen ovale was identified   by color flow Doppler. - Pulmonary arteries: Systolic pressure was within the normal   range.   Discharge Instructions: Discharge Instructions  Call MD for:  extreme fatigue   Complete by:  As directed    Call MD for:  persistant dizziness or light-headedness   Complete by:  As directed    Call MD for:  severe uncontrolled pain   Complete by:  As directed    Diet - low sodium heart healthy   Complete by:  As directed    Discharge instructions   Complete by:  As directed    [name],   You were admitted to the hospital due to chest pain. The blood test and other imaging test that we performed in the hospital were all normal. The ultrasound of your heart was normal.  We do not think the pain in your chest is coming form your heart. The cardiologists would like to see you as an outpatient in their office to follow up with you and for a stress test. Their office will give you a call to set up this appointment. In the meantime, please continue taking your medication as usual.   Some times, pain from your stomach can feel as chest pain. For this, we would like you to take a new medication called protonix. You will take 1 tablet of 40 mg one time a day 30 minutes before a meal. IF taken during or after a meal, the medication will not work.   Please keep  your appointment with your PCP in July and call us if you have any questions.   - Dr. Frederico Hamman   Increase activity slowly   Complete by:  As directed       Signed: Welford Roche, MD 10/09/2017, 7:45 AM   Pager: [phone]

## 2017-10-06 NOTE — Progress Notes (Signed)
  Echocardiogram 2D Echocardiogram has been performed.  Jenkins Risdon L Androw 10/06/2017, 10:19 AM

## 2017-10-06 NOTE — Progress Notes (Signed)
Subjective:  No acute events overnight. She did have two episodes of sharp in the center of the chest that resolved spontaneously. Nitrate patch also helped mildly with pain. She also describes chest tightness this morning which she sometimes experiences during COPD exacerbations. Also complaining of mild HA, though improved from yesterday.   Objective:  Vital signs in last 24 hours: Vitals:   10/05/17 1942 10/05/17 2118 10/05/17 2313 10/06/17 0617  BP:  (!) 103/55  138/78  Pulse:  95 95 83  Resp:  19 16 18   Temp:  98.7 F (37.1 C)  98.1 F (36.7 C)  TempSrc:  Oral  Oral  SpO2: 95% 97% 95% 100%  Weight:    262 lb 12.8 oz (119.2 kg)  Height:       Physical Exam  Constitutional: She is oriented to person, place, and time.  Morbidly obese female,well-developed, laying in bed in no acute distress   HENT:  Mouth/Throat: Oropharynx is clear and moist.  Eyes: Pupils are equal, round, and reactive to light. Conjunctivae are normal.  Cardiovascular: Normal rate, regular rhythm and normal heart sounds. Exam reveals no gallop and no friction rub.  No murmur heard. Pulmonary/Chest:  Mild diffuse expiratory wheezes, improved from yesterday. No crackles. Able to speak in full sentences and oxygenating well on room air. No increased work of breathing noted.  Abdominal: Soft. Bowel sounds are normal. She exhibits no distension. There is no tenderness.  Musculoskeletal: She exhibits edema (Trace pedal edema bilaterally (chronic)).  Neurological: She is alert and oriented to person, place, and time.    Assessment/Plan:  Principal Problem:   Atypical chest pain Active Problems:   GERD (gastroesophageal reflux disease)   Hypertension associated with diabetes ([place])   DM (diabetes mellitus) type II controlled, neurological manifestation (HCC)   Sleep apnea   Leukocytosis   COPD with chronic bronchitis (HCC)   Tobacco use disorder-current smoker greater than 40-pack-year history- since age  52 1 ppd  # Atypical chest pain: Reports 2 episodes of sharp pain and chest tightness overnight. States nitrate patch somewhat helped. Telemetry reviewed, NSR noted. TTE performed, report pending. LDL 132 and TG 177.  TSH normal. Low suspicion for cardiac etiology at this time in the setting of no EKG changes and negative troponin. Cardiology note reviewed, plan to discharge home with outpatient follow up and coronary CTA if TTE negative.  - Cardiology following, awaiting TTE to determine dispo  - Telemetry   - Aspirin 81 mg QD and atorvastatin 40 mg QD  - Protonix 40 mg QD   # HTN: uncontrolled, recently started on losartan-HCTZ on 6/10. Currently normotensive.  - Continue home losartan-HCTZ 100-12.5 mg QD   # COPD # OSA  - Duonebs q6h PRN  - CPAP QHS   # Chronic leukocytosis: normal diff. Could be secondary to chronic inflammation from long history of smoking. Low suspicion for myeloproliferative disorder as given normal Hgb and platelets.  - Blood smear ordered and pending, previous one with atypical cells only    # Uncontrolled T2DM: Last A1c 10.2. Only on Invokana at home, recently started on 6/10.  - SSI-S + CBG monitoring  - Continue home Lyrica 75 mg QD BID and atorva as above  - Hold home Invokana   #  Tobacco abuse: Smoking 1.5ppd  - Nicotine patch 21 mg QD  - Counseled on cessation    Dispo: Anticipated discharge in approximately today-1 day(s).   Welford Roche, MD 10/06/2017, 9:16 AM Pager: [phone]

## 2017-10-15 ENCOUNTER — Institutional Professional Consult (permissible substitution): Payer: Self-pay | Admitting: Pulmonary Disease

## 2017-10-26 DIAGNOSIS — J449 Chronic obstructive pulmonary disease, unspecified: Secondary | ICD-10-CM | POA: Diagnosis not present

## 2017-10-29 ENCOUNTER — Ambulatory Visit (HOSPITAL_COMMUNITY): Admission: RE | Admit: 2017-10-29 | Payer: Medicare HMO | Source: Ambulatory Visit

## 2017-10-31 ENCOUNTER — Ambulatory Visit (INDEPENDENT_AMBULATORY_CARE_PROVIDER_SITE_OTHER): Payer: Medicare HMO | Admitting: Psychiatry

## 2017-10-31 ENCOUNTER — Encounter (HOSPITAL_COMMUNITY): Payer: Self-pay | Admitting: Psychiatry

## 2017-10-31 VITALS — BP 112/77 | HR 92 | Ht 64.0 in | Wt 263.0 lb

## 2017-10-31 DIAGNOSIS — F331 Major depressive disorder, recurrent, moderate: Secondary | ICD-10-CM | POA: Diagnosis not present

## 2017-10-31 DIAGNOSIS — F901 Attention-deficit hyperactivity disorder, predominantly hyperactive type: Secondary | ICD-10-CM

## 2017-10-31 MED ORDER — AMPHETAMINE-DEXTROAMPHETAMINE 20 MG PO TABS: 20.0000 mg | ORAL_TABLET | Freq: Two times a day (BID) | ORAL | 0 refills | Status: DC

## 2017-10-31 MED ORDER — ALPRAZOLAM 0.5 MG PO TABS: 0.5000 mg | ORAL_TABLET | Freq: Every day | ORAL | 2 refills | Status: DC | PRN

## 2017-10-31 MED ORDER — DULOXETINE HCL 60 MG PO CPEP: 60.0000 mg | ORAL_CAPSULE | Freq: Two times a day (BID) | ORAL | 2 refills | Status: DC

## 2017-10-31 NOTE — Progress Notes (Signed)
[place] MD/PA/NP OP Progress Note  10/31/2017 11:37 AM [name]  MRN:  [id number]  Chief Complaint:  Chief Complaint    Depression; Anxiety; Follow-up     HPI: This patient is a 52 year old married female who lives [place] in [place].  She is a past patient who was last seen 18 months ago.  [place] area and wants to resume care here.  She is currently on disability.  The patient was last seen here on 05/07/2015.  At that time she was being treated for depression anxiety and ADHD.  [place] was able to get in with a psychiatrist.  Her primary physician there continued her medications which included Cymbalta and Adderall and occasional Xanax.  The patient moved back to this area last September.  Her daughter was having difficulties.  Her daughter was developing severe hydradenitis suprativa and was also pregnant.  She and her boyfriend came back here to support the daughter.  Since then she is been hospitalized twice for pneumonia.  She is gotten off all of her medications for diabetes hypertension cholesterol etc. but has establish care with a primary care physician in the [facility].  Last month she was restarted on all of her medications.  However she is off all of her psychiatric medicines.  The patient states that without the Cymbalta she is somewhat depressed and is also experiencing more neuropathy in her feet.  She is off the Adderall and cannot stay focused at all.  She denies being seriously depressed or suicidal.  Her sleep is fairly good most of the time but the neuropathy keeps her up at night.  She is very concerned about her daughter who is now suffering from postpartum depression and is not getting adequate help.  The patient states that she could use Xanax but only very periodically and I think this is reasonable. Visit Diagnosis:    ICD-10-CM   1. Moderate  episode of recurrent major depressive disorder (HCC) F33.1   2. Attention deficit hyperactivity disorder (ADHD), predominantly hyperactive type F90.1 amphetamine-dextroamphetamine (ADDERALL) 20 MG tablet    Past Psychiatric History: 2 prior psychiatric hospitalizations in her 46s.  Her last hospitalization is 2006 after a drug overdose.  Since then she is received outpatient treatment  Past Medical History:  Past Medical History:  Diagnosis Date  . Anemia   . Anxiety   . Asthmatic bronchitis    normal PFT/ seen by pulmonary no evidence of COPD  . Chronic bronchitis ([place])   . Chronic lower back pain   . Chronic respiratory failure with hypoxia (HCC)    On 2-3 L of oxygen at home  . COPD (chronic obstructive pulmonary disease) ([place])   . Daily headache   . Depression   . Diabetic peripheral neuropathy ([place])   . Gastric erosions    EGD 08/2010.  [name] GERD (gastroesophageal reflux disease)   . Heavy menses   . High cholesterol   . History of blood transfusion    "related to low HgB" (10/05/2017)  . History of hiatal hernia   . HTN (hypertension)   . Increased urinary protein excretion   . Internal hemorrhoids    Colonoscopy 5/12.  . On home oxygen therapy    "5L at night" (10/05/2017)  . OSA on CPAP   . Osteoarthritis    "back" (10/05/2017)  . Pneumonia    "  lots of times" (10/05/2017)  . Shortness of breath   . Tachycardia    never had test done since no insurance  . Type II diabetes mellitus ([place])     Past Surgical History:  Procedure Laterality Date  . CESAREAN SECTION  1988; 1989  . CHOLECYSTECTOMY OPEN  1990  . COLONOSCOPY  09/16/2010   ZOX:WRUEAV colon/small internal hemorrhoids  . ESOPHAGOGASTRODUODENOSCOPY  09/16/2010   SLF: normal/mild gastritis  . ESOPHAGOGASTRODUODENOSCOPY N/A 10/19/2014   Procedure: ESOPHAGOGASTRODUODENOSCOPY (EGD);  Surgeon: Sheila Binder, MD;  Location: [facility] SUITE;  Service: Endoscopy;  Laterality: N/A;  830  . FRACTURE SURGERY    .  HYSTEROSCOPY WITH THERMACHOICE  01/17/2012   Procedure: HYSTEROSCOPY WITH THERMACHOICE;  Surgeon: Sheila Buff, MD;  Location: [facility] ORS;  Service: Gynecology;  Laterality: N/A;  total therapy time: 9:13sec  D5W  18 ml in, D5W   76ml out, temperture 87degrees celcious  . KIDNEY SURGERY     as child for blockages  . TONSILLECTOMY    . TUBAL LIGATION  1989  . TYMPANOSTOMY TUBE PLACEMENT Bilateral    "several times when I was a child"  . uterine ablation    . WRIST FRACTURE SURGERY Left 1995    Family Psychiatric History: Both parents have a history of depression  Family History:  Family History  Problem Relation Age of Onset  . Heart attack Father 73       deceased, etoh use  . Heart disease Father   . Alcohol abuse Father   . Depression Father   . Heart attack Mother 2       deceased  . Diabetes Mother   . Breast cancer Mother   . Heart failure Mother        oxygen dependence, nonsmoker  . Heart disease Mother   . Depression Mother   . Cancer Mother   . Liver disease Maternal Aunt 84       died while on liver transplant list  . Heart attack Maternal Grandmother        premature CAD  . Ulcers Sister   . Hypertension Sister   . Colon cancer Neg Hx     Social History:  Social History   Socioeconomic History  . Marital status: Single    Spouse name: Not on file  . Number of children: 2  . Years of education: Not on file  . Highest education level: Not on file  Occupational History  . Occupation: unemployed  Social Needs  . Financial resource strain: Not on file  . Food insecurity:    Worry: Not on file    Inability: Not on file  . Transportation needs:    Medical: Not on file    Non-medical: Not on file  Tobacco Use  . Smoking status: Current Every Day Smoker    Packs/day: 1.50    Years: 39.00    Pack years: 58.50    Types: Cigarettes  . Smokeless tobacco: Never Used  Substance and Sexual Activity  . Alcohol use: Yes    Comment: 10/05/2017 "5 - 8 shots  tequilia//month"  . Drug use: Yes    Types: Marijuana    Comment: 10/05/2017 "a few times/wk"  . Sexual activity: Yes    Birth control/protection: Surgical  Lifestyle  . Physical activity:    Days per week: Not on file    Minutes per session: Not on file  . Stress: Not on file  Relationships  . Social connections:  Talks on phone: Not on file    Gets together: Not on file    Attends religious service: Not on file    Active member of club or organization: Not on file    Attends meetings of clubs or organizations: Not on file    Relationship status: Not on file  Other Topics Concern  . Not on file  Social History Narrative  . Not on file    Allergies:  Allergies  Allergen Reactions  . Acyclovir And Related   . Codeine Itching and Nausea Only  . Metformin And Related Diarrhea and Other (See Comments)    Stomach pain/nausea  . Wellbutrin [Bupropion Hcl] Hives    Metabolic Disorder Labs: Lab Results  Component Value Date   HGBA1C 10.2 (A) 10/01/2017   MPG 160 (H) 03/10/2015   MPG 177 (H) 08/31/2014   No results found for: PROLACTIN Lab Results  Component Value Date   CHOL 192 10/06/2017   TRIG 177 (H) 10/06/2017   HDL 25 (L) 10/06/2017   CHOLHDL 7.7 10/06/2017   VLDL 35 10/06/2017   LDLCALC 132 (H) 10/06/2017   LDLCALC 84 03/10/2015   Lab Results  Component Value Date   TSH 1.961 10/05/2017   TSH 1.759 09/15/2014    Therapeutic Level Labs: No results found for: LITHIUM No results found for: VALPROATE No components found for:  CBMZ  Current Medications: Current Outpatient Medications  Medication Sig Dispense Refill  . atorvastatin (LIPITOR) 40 MG tablet Take 1 tablet (40 mg total) by mouth daily at 6 PM. 30 tablet 0  . canagliflozin (INVOKANA) 300 MG TABS tablet Take 1 tablet (300 mg total) by mouth daily before breakfast. 60 tablet 0  . fluconazole (DIFLUCAN) 200 MG tablet Take 1 tablet (200 mg total) by mouth once a week. 4 tablet 0  . ibuprofen  (ADVIL,MOTRIN) 200 MG tablet Take 800 mg by mouth every 6 (six) hours as needed.    [name] losartan-hydrochlorothiazide (HYZAAR) 100-12.5 MG tablet Take 1 tablet by mouth daily. 60 tablet 0  . pantoprazole (PROTONIX) 40 MG tablet Take 1 tablet (40 mg total) by mouth daily. 30 minutes before a meal. 30 tablet 1  . pregabalin (LYRICA) 75 MG capsule Take 1 capsule (75 mg total) by mouth 2 (two) times daily. 60 capsule 1  . Pseudoeph-Doxylamine-DM-APAP (NYQUIL MULTI-SYMPTOM PO) Take by mouth daily as needed.    . ALPRAZolam (XANAX) 0.5 MG tablet Take 1 tablet (0.5 mg total) by mouth daily as needed for anxiety. 30 tablet 2  . amphetamine-dextroamphetamine (ADDERALL) 20 MG tablet Take 1 tablet (20 mg total) by mouth 2 (two) times daily. 60 tablet 0  . amphetamine-dextroamphetamine (ADDERALL) 20 MG tablet Take 1 tablet (20 mg total) by mouth 2 (two) times daily. 60 tablet 0  . DULoxetine (CYMBALTA) 60 MG capsule Take 1 capsule (60 mg total) by mouth 2 (two) times daily. 60 capsule 2   No current facility-administered medications for this visit.      Musculoskeletal: Strength & Muscle Tone: within normal limits Gait & Station: normal Patient leans: N/A  Psychiatric Specialty Exam: Review of Systems  Neurological: Positive for tingling.  Psychiatric/Behavioral: Positive for depression.  All other systems reviewed and are negative.   Blood pressure 112/77, pulse 92, height 5\' 4"  (1.626 m), weight 263 lb (119.3 kg), SpO2 95 %.Body mass index is 45.14 kg/m.  General Appearance: Casual and Fairly Groomed  Eye Contact:  Good  Speech:  Clear and Coherent  Volume:  Normal  Mood:  Anxious and Dysphoric  Affect:  Depressed  Thought Process:  Goal Directed  Orientation:  Full (Time, Place, and Person)  Thought Content: Rumination   Suicidal Thoughts:  No  Homicidal Thoughts:  No  Memory:  Immediate;   Good Recent;   Good Remote;   Good  Judgement:  Good  Insight:  Fair  Psychomotor Activity:   Normal  Concentration:  Concentration: Poor and Attention Span: Poor  Recall:  Good  Fund of Knowledge: Good  Language: Good  Akathisia:  No  Handed:  Right  AIMS (if indicated): not done  Assets:  Communication Skills Desire for Improvement Resilience Social Support Talents/Skills  ADL's:  Intact  Cognition: WNL  Sleep:  Fair   Screenings: AIMS     Admission (Discharged) from 09/12/2014 in [place] [facility]  AIMS Total Score  0    AUDIT     Admission (Discharged) from 09/12/2014 in [place] [facility]  Alcohol Use Disorder Identification Test Final Score (AUDIT)  0    PHQ2-9     Office Visit from 10/01/2017 in [facility] Total Score  2  PHQ-9 Total Score  10       Assessment and Plan: This is a 52 year old female with a history of depression anxiety and ADHD.  She would like to resume her prior medications that she is starting to have symptoms without them.  She will return to Cymbalta 60 mg twice a day but start on once a day for the first week, Xanax 0.5 mg at bedtime as needed for anxiety or sleep and Adderall 20 mg twice daily for ADHD symptoms.  She will return to see me in 2 months   [name], MD 10/31/2017, 11:37 AM

## 2017-11-05 ENCOUNTER — Ambulatory Visit: Payer: Self-pay | Admitting: Dietician

## 2017-11-08 ENCOUNTER — Other Ambulatory Visit (INDEPENDENT_AMBULATORY_CARE_PROVIDER_SITE_OTHER): Payer: Medicare HMO

## 2017-11-08 DIAGNOSIS — E1169 Type 2 diabetes mellitus with other specified complication: Secondary | ICD-10-CM

## 2017-11-08 DIAGNOSIS — F17200 Nicotine dependence, unspecified, uncomplicated: Secondary | ICD-10-CM

## 2017-11-08 DIAGNOSIS — E1159 Type 2 diabetes mellitus with other circulatory complications: Secondary | ICD-10-CM

## 2017-11-08 DIAGNOSIS — J449 Chronic obstructive pulmonary disease, unspecified: Secondary | ICD-10-CM

## 2017-11-08 DIAGNOSIS — I10 Essential (primary) hypertension: Secondary | ICD-10-CM | POA: Diagnosis not present

## 2017-11-08 DIAGNOSIS — E782 Mixed hyperlipidemia: Secondary | ICD-10-CM | POA: Diagnosis not present

## 2017-11-08 DIAGNOSIS — G4733 Obstructive sleep apnea (adult) (pediatric): Secondary | ICD-10-CM

## 2017-11-08 DIAGNOSIS — R809 Proteinuria, unspecified: Secondary | ICD-10-CM

## 2017-11-08 DIAGNOSIS — E559 Vitamin D deficiency, unspecified: Secondary | ICD-10-CM | POA: Diagnosis not present

## 2017-11-08 DIAGNOSIS — B373 Candidiasis of vulva and vagina: Secondary | ICD-10-CM

## 2017-11-08 DIAGNOSIS — F332 Major depressive disorder, recurrent severe without psychotic features: Secondary | ICD-10-CM

## 2017-11-08 DIAGNOSIS — Z9989 Dependence on other enabling machines and devices: Secondary | ICD-10-CM

## 2017-11-08 DIAGNOSIS — E138 Other specified diabetes mellitus with unspecified complications: Secondary | ICD-10-CM

## 2017-11-08 DIAGNOSIS — D509 Iron deficiency anemia, unspecified: Secondary | ICD-10-CM

## 2017-11-08 DIAGNOSIS — B3731 Acute candidiasis of vulva and vagina: Secondary | ICD-10-CM

## 2017-11-08 DIAGNOSIS — Z716 Tobacco abuse counseling: Secondary | ICD-10-CM

## 2017-11-08 DIAGNOSIS — E1129 Type 2 diabetes mellitus with other diabetic kidney complication: Secondary | ICD-10-CM | POA: Diagnosis not present

## 2017-11-08 DIAGNOSIS — E1142 Type 2 diabetes mellitus with diabetic polyneuropathy: Secondary | ICD-10-CM | POA: Diagnosis not present

## 2017-11-09 LAB — VITAMIN D 25 HYDROXY (VIT D DEFICIENCY, FRACTURES): Vit D, 25-Hydroxy: 20.9 ng/mL — ABNORMAL LOW (ref 30.0–100.0)

## 2017-11-09 LAB — T4, FREE: Free T4: 1.29 ng/dL (ref 0.82–1.77)

## 2017-11-12 ENCOUNTER — Ambulatory Visit (INDEPENDENT_AMBULATORY_CARE_PROVIDER_SITE_OTHER): Payer: Medicare HMO | Admitting: Family Medicine

## 2017-11-12 ENCOUNTER — Encounter: Payer: Self-pay | Admitting: Family Medicine

## 2017-11-12 VITALS — BP 124/84 | HR 110 | Ht 64.0 in | Wt 261.3 lb

## 2017-11-12 DIAGNOSIS — Z87898 Personal history of other specified conditions: Secondary | ICD-10-CM

## 2017-11-12 DIAGNOSIS — E1142 Type 2 diabetes mellitus with diabetic polyneuropathy: Secondary | ICD-10-CM | POA: Diagnosis not present

## 2017-11-12 DIAGNOSIS — D509 Iron deficiency anemia, unspecified: Secondary | ICD-10-CM | POA: Diagnosis not present

## 2017-11-12 DIAGNOSIS — E1159 Type 2 diabetes mellitus with other circulatory complications: Secondary | ICD-10-CM | POA: Diagnosis not present

## 2017-11-12 DIAGNOSIS — D72829 Elevated white blood cell count, unspecified: Secondary | ICD-10-CM

## 2017-11-12 DIAGNOSIS — N761 Subacute and chronic vaginitis: Secondary | ICD-10-CM

## 2017-11-12 DIAGNOSIS — F17210 Nicotine dependence, cigarettes, uncomplicated: Secondary | ICD-10-CM | POA: Diagnosis not present

## 2017-11-12 DIAGNOSIS — E559 Vitamin D deficiency, unspecified: Secondary | ICD-10-CM

## 2017-11-12 DIAGNOSIS — R809 Proteinuria, unspecified: Secondary | ICD-10-CM | POA: Diagnosis not present

## 2017-11-12 DIAGNOSIS — F332 Major depressive disorder, recurrent severe without psychotic features: Secondary | ICD-10-CM

## 2017-11-12 DIAGNOSIS — F17200 Nicotine dependence, unspecified, uncomplicated: Secondary | ICD-10-CM

## 2017-11-12 DIAGNOSIS — B3731 Acute candidiasis of vulva and vagina: Secondary | ICD-10-CM

## 2017-11-12 DIAGNOSIS — E1169 Type 2 diabetes mellitus with other specified complication: Secondary | ICD-10-CM

## 2017-11-12 DIAGNOSIS — F1911 Other psychoactive substance abuse, in remission: Secondary | ICD-10-CM | POA: Insufficient documentation

## 2017-11-12 DIAGNOSIS — E1165 Type 2 diabetes mellitus with hyperglycemia: Secondary | ICD-10-CM

## 2017-11-12 DIAGNOSIS — E785 Hyperlipidemia, unspecified: Secondary | ICD-10-CM

## 2017-11-12 DIAGNOSIS — E782 Mixed hyperlipidemia: Secondary | ICD-10-CM

## 2017-11-12 DIAGNOSIS — I10 Essential (primary) hypertension: Secondary | ICD-10-CM

## 2017-11-12 DIAGNOSIS — B373 Candidiasis of vulva and vagina: Secondary | ICD-10-CM

## 2017-11-12 HISTORY — DX: Other psychoactive substance abuse, in remission: F19.11

## 2017-11-12 HISTORY — DX: Type 2 diabetes mellitus with hyperglycemia: E11.65

## 2017-11-12 MED ORDER — LIRAGLUTIDE 18 MG/3ML ~~LOC~~ SOPN: PEN_INJECTOR | SUBCUTANEOUS | 1 refills | Status: DC

## 2017-11-12 MED ORDER — PREGABALIN 75 MG PO CAPS: 75.0000 mg | ORAL_CAPSULE | Freq: Three times a day (TID) | ORAL | 5 refills | Status: DC

## 2017-11-12 MED ORDER — VITAMIN D (ERGOCALCIFEROL) 1.25 MG (50000 UNIT) PO CAPS
50000.0000 [IU] | ORAL_CAPSULE | ORAL | 10 refills | Status: DC
Start: 2017-11-12 — End: 2019-03-11

## 2017-11-12 NOTE — Progress Notes (Signed)
Assessment and plan:  1. Iron deficiency anemia, unspecified iron deficiency anemia type   2. H/O drug abuse   3. Diabetic peripheral neuropathy ([place])   4. Mixed diabetic hyperlipidemia associated with type 2 diabetes mellitus ([place])   5. Hyperlipidemia associated with type 2 diabetes mellitus ([place])   6. Hypertension associated with diabetes ([place])   7. Tobacco use disorder-current smoker greater than 40-pack-year history- since age 52 1 ppd   37. Positive for macroalbuminuria   9. Severe episode of recurrent major depressive disorder, without psychotic features ([place])   10. Cigarette smoker   11. Subacute vaginitis   12. h/o freq Vaginal yeast infections   13. Obesity, Class III, BMI 40-49.9 (morbid obesity) ([place])   14. Morbid obesity ([place])   15. Vitamin D deficiency   16. Poorly controlled diabetes mellitus ([place])   17. Leukocytosis, unspecified type     - Without further workup and treatment planned, risk to pt's morbidity is high with possible prolonged functional impairment and poor health outcomes.  1. Recent lab work reviewed with patient (June 16 & November 08, 2017). CBC = Reviewed the need for smoking cessation to improve body's oxygen supply. Kidney Function = Reviewed need to control blood sugar & blood pressure to preserve kidney health. Low Calcium = Google "Calcium rich foods" to consume & help improve calcium levels. Albumin = Borderline low; reviewed need for patient to adequately nourish. Liver = WNL. TSH = WNL. T4 = WNL. Vitamin D = low, 20.9.  Reviewed goal in 5-60 range. - Patient will begin supplemental Vitamin D.  - Repeat labs after continued management on several medications.  2. Diabetes Mellitus - Poorly controlled at this time. - Invokana brought pt blood sugars down, per pt from 300-400 to 200-300. - Pt tolerating med well and compliant with treatment as prescribed. - Will begin second  injectable today (Victoza).  See med list below. - Reviewed risks and benefits as well as likely S-E of Victoza today.  - Counseled patient on pathophysiology of disease and discussed various treatment options, which often includes dietary and lifestyle modifications as first line.  Importance of prudent, low carb, and/or ketogenic diet discussed with patient in addition to regular exercise.   - Check FBS and 2 hours after the biggest meal of your day.  Keep log and bring in next OV for my review.   Also, if you ever feel poorly, please check your blood pressure and blood sugar, as one or the other could be the cause of your symptoms.  - Being a diabetic, you need yearly eye and foot exams. Make appt.for diabetic eye exam.  - Patient is going to diabetic nutrition educator in August.  3. Mixed Hyperlipidemia - Continue lipitor as prescribed  - STRONGLY advised patient to take full dose, 40 mg. - Patient has been taking 10 mg instead of 40 mg.  HDL = 25, low. LDL = 132, elevated. Triglycerides = 177, elevated.  - Re-check in 4 months.  The 10-year ASCVD risk score Mikey Bussing DC Brooke Bonito., et al., 2013) is: 20.4%   Values used to calculate the score:     Age: 41 years     Sex: Female     Is Non-Hispanic African American: No     Diabetic: Yes     Tobacco smoker: Yes     Systolic Blood Pressure: 361 mmHg     Is BP treated: Yes     HDL Cholesterol: 25 mg/dL  Total Cholesterol: 192 mg/dL  4. Hypertension - Continue medications as prescribed.  - Lifestyle changes such as dash diet and engaging in a regular exercise program discussed with patient.  Educational handouts provided  - Ambulatory BP monitoring encouraged. Keep log and bring in next OV.  - Would consider adding carvedilol as next step, but tachycardia likely due to morbid obesity and physical deconditioning.  5. Diabetic Peripheral Neuropathy - Lyrica dose changed - take 75 TID, morning, noon, and night.   - (Pt was on 150 BID  in the past.)  6. Subacute Vaginitis - h/o yeast infection - Pt treated recently on diflucan, with history of yeast infections. - Pt agrees to self wet prep today.  7. F/up with Psychiatry - ADHD - Patient obtaining follow-up on ADHD meds from psychiatry. - Recently re-established with psychiatry.  8. BMI Counseling Explained to patient what BMI refers to, and what it means medically.    Told patient to think about it as a "medical risk stratification measurement" and how increasing BMI is associated with increasing risk/ or worsening state of various diseases such as hypertension, hyperlipidemia, diabetes, premature OA, depression etc.  American Heart Association guidelines for healthy diet, basically Mediterranean diet, and exercise guidelines of 30 minutes 5 days per week or more discussed in detail.  Health counseling performed.  All questions answered.  9. Lifestyle & Preventative Health Maintenance - Advised patient to continue working toward exercising to improve overall mental, physical, and emotional health.    - Encouraged patient to engage in daily physical activity and formal/dedicated exercise.  Recommended that the patient eventually strive for at least 150 minutes of moderate cardiovascular activity per week according to guidelines established by the [facility].   - Healthy dietary habits encouraged, including low-carb, and high amounts of lean protein in diet.   - Patient should also consume adequate amounts of water - half of body weight in oz of water per day.   Education and routine counseling performed. Handouts provided.  10. Follow-Up - Re-check fasting lipid profile, CBC, CMP, Vitamin D, in 4 months.  - Return in 6 weeks for next OV.  - Continue to return for regularly scheduled chronic follow-up.  - Return to clinic sooner PRN if symptomatic, feeling unwell, or if "something isn't right."    Pt was in the office today for 32.5+ minutes, with over 50% time  spent in face to face counseling of patients various medical conditions, treatment plans of those medical conditions including medicine management and lifestyle modification, strategies to improve health and well being; and in coordination of care. SEE ABOVE TREATMENT PLAN FOR DETAILS    Orders Placed This Encounter  Procedures  . Hemoglobin A1c  . Lipid panel  . CBC with Differential/Platelet  . Comprehensive metabolic panel  . VITAMIN D 25 Hydroxy (Vit-D Deficiency, Fractures)    Meds ordered this encounter  Medications  . pregabalin (LYRICA) 75 MG capsule    Sig: Take 1 capsule (75 mg total) by mouth 3 (three) times daily.    Dispense:  90 capsule    Refill:  5  . liraglutide (VICTOZA) 18 MG/3ML SOPN    Sig: 0.'6mg'$  [place] qd * 1 wk, then 1.'2mg'$  [place] qd as tolerated    Dispense:  6 pen    Refill:  1  . Vitamin D, Ergocalciferol, (DRISDOL) 50000 units CAPS capsule    Sig: Take 1 capsule (50,000 Units total) by mouth every 7 (seven) days.    Dispense:  12  capsule    Refill:  10     Return for 6wks- since inc lyrica and added victoza, inc lipitor.   Anticipatory guidance and routine counseling done re: condition, txmnt options and need for follow up. All questions of patient's were answered.   Gross side effects, risk and benefits, and alternatives of medications discussed with patient.  Patient is aware that all medications have potential side effects and we are unable to predict every sideeffect or drug-drug interaction that may occur.  Expresses verbal understanding and consents to current therapy plan and treatment regiment.  Please see AVS handed out to patient at the end of our visit for additional patient instructions/ counseling done pertaining to today's office visit.  Note: This document was prepared using Dragon voice recognition software and may include unintentional dictation errors.  This document serves as a record of services personally performed by Mellody Dance,  DO. It was created on her behalf by Toni Amend, a trained medical scribe. The creation of this record is based on the scribe's personal observations and the provider's statements to them.   I have reviewed the above medical documentation for accuracy and completeness and I concur.  Mellody Dance 11/18/17 9:46 PM   ----------------------------------------------------------------------------------------------------------------------  Subjective:   CC:   [name] is a 52 y.o. female who presents to [place] [facility] today for review and discussion of recent bloodwork that was done.  1. All recent blood work that we ordered was reviewed with patient today.  Patient was counseled on all abnormalities and we discussed dietary and lifestyle changes that could help those values (also medications when appropriate).  Extensive health counseling performed and all patient's concerns/ questions were addressed.   Here to see her lab test results.  Psychiatry - ADHD  Patient recently re-acquainted with psychiatry. Obtains ADHD meds through psychiatry.  DM- Progress on Invokana States sugar is still running high Brought her sugar down from 300-400, to 200-300. She's tolerating this well and taking it every day.  Ongoing Numbness in Feet Patient notes that she would like to go up on her Lyrica due to experiencing continued burning in her feet.  States the symptoms have eased up to where she can sleep at night again; notes "it's not to the point it was before."  When she walks, she experiences worsened numbness in the feet.  "The more I walk, the more numb they get."  Yeast Infections Patient notes she's still getting yeast infections, but thinks this is due to her blood sugar. She currently has a recurring yeast infection, which cleared up a little after dose of diflucan. Patient notes itching and burning and "crusty film."    Wt Readings from Last 3  Encounters:  11/12/17 261 lb 4.8 oz (118.5 kg)  10/06/17 262 lb 12.8 oz (119.2 kg)  10/01/17 267 lb 14.4 oz (121.5 kg)   BP Readings from Last 3 Encounters:  11/17/17 126/75  11/12/17 124/84  10/06/17 106/61   Pulse Readings from Last 3 Encounters:  11/17/17 91  11/12/17 (!) 110  10/06/17 97   BMI Readings from Last 3 Encounters:  11/12/17 44.85 kg/m  10/06/17 45.11 kg/m  10/01/17 45.98 kg/m     Patient Care Team    Relationship Specialty Notifications Start End  Mellody Dance, DO PCP - General Family Medicine  10/05/17   Belva Crome, MD PCP - Cardiology Cardiology Admissions 10/05/17   Danie Binder, MD  Gastroenterology  09/08/10  Full medical history updated and reviewed in the office today  Patient Active Problem List   Diagnosis Date Noted  . Poorly controlled diabetes mellitus ([place]) 11/12/2017    Priority: High  . Mixed diabetic hyperlipidemia associated with type 2 diabetes mellitus ([place]) 10/01/2017    Priority: High  . Tobacco use disorder-current smoker greater than 40-pack-year history- since age 45 1 ppd 10/01/2017    Priority: High  . Diabetic peripheral neuropathy ([place]) 10/01/2017    Priority: High  . Hyperlipidemia associated with type 2 diabetes mellitus ([place]) 10/18/2015    Priority: High  . Hypertension associated with diabetes ([place]) 12/02/2010    Priority: High  . DM (diabetes mellitus) type II controlled, neurological manifestation ([place]) 12/02/2010    Priority: High  . Positive for macroalbuminuria 11/12/2017    Priority: Medium  . COPD with chronic bronchitis ([place]) 10/01/2017    Priority: Medium  . Major depressive disorder, recurrent, severe without psychotic features ([place])     Priority: Medium  . Vitamin D deficiency 07/06/2011    Priority: Medium  . Morbid obesity ([place]) 05/06/2011    Priority: Medium  . H/O drug abuse 11/12/2017    Priority: Low  . h/o freq Vaginal yeast infections 11/12/2017    Priority: Low  . Adult  attention deficit hyperactivity disorder 01/14/2016    Priority: Low  . Gastric reflux 10/18/2015    Priority: Low  . Asthmatic bronchitis 06/11/2011    Priority: Low  . Atypical chest pain 10/05/2017  . Tobacco abuse counseling 10/01/2017  . Iron deficiency anemia 10/01/2017  . Dyspepsia 09/23/2014  . MDD (major depressive disorder), recurrent episode, severe ([place]) 09/13/2014  . Nasal congestion 05/03/2014  . Acute on chronic respiratory failure with hypoxia ([place])   . Chest pain on respiration 03/25/2014  . Leukocytosis 03/25/2014  . Upper airway cough syndrome 02/28/2014  . Abnormal drug screen 04/11/2013  . Sleep apnea 04/11/2013  . Perforated ear drum 05/22/2012  . Bell's palsy 01/23/2012  . Carpal tunnel syndrome 01/23/2012  . Carpal tunnel syndrome on both sides 01/11/2012  . Pulmonary infiltrates 12/22/2011  . Chronic respiratory failure ([place]) 10/16/2011  . Oxygen dependent 10/16/2011  . Peripheral neuropathy 07/07/2011  . Back pain 06/11/2011  . Depression 05/04/2011  . Gastric erosions 12/05/2010  . Cigarette smoker 12/02/2010  . Microcytic anemia 09/08/2010  . GERD (gastroesophageal reflux disease) 09/08/2010  . Esophageal dysphagia 09/08/2010    Past Medical History:  Diagnosis Date  . Anemia   . Anxiety   . Asthmatic bronchitis    normal PFT/ seen by pulmonary no evidence of COPD  . Chronic bronchitis ([place])   . Chronic lower back pain   . Chronic respiratory failure with hypoxia (HCC)    On 2-3 L of oxygen at home  . COPD (chronic obstructive pulmonary disease) ([place])   . Daily headache   . Depression   . Diabetic peripheral neuropathy ([place])   . Gastric erosions    EGD 08/2010.  [name] GERD (gastroesophageal reflux disease)   . Heavy menses   . High cholesterol   . History of blood transfusion    "related to low HgB" (10/05/2017)  . History of hiatal hernia   . HTN (hypertension)   . Increased urinary protein excretion   . Internal hemorrhoids     Colonoscopy 5/12.  . On home oxygen therapy    "5L at night" (10/05/2017)  . OSA on CPAP   . Osteoarthritis    "back" (10/05/2017)  .  Pneumonia    "lots of times" (10/05/2017)  . Shortness of breath   . Tachycardia    never had test done since no insurance  . Type II diabetes mellitus ([place])     Past Surgical History:  Procedure Laterality Date  . CESAREAN SECTION  1988; 1989  . CHOLECYSTECTOMY OPEN  1990  . COLONOSCOPY  09/16/2010   OHY:WVPXTG colon/small internal hemorrhoids  . ESOPHAGOGASTRODUODENOSCOPY  09/16/2010   SLF: normal/mild gastritis  . ESOPHAGOGASTRODUODENOSCOPY N/A 10/19/2014   Procedure: ESOPHAGOGASTRODUODENOSCOPY (EGD);  Surgeon: Danie Binder, MD;  Location: [facility] ENDO SUITE;  Service: Endoscopy;  Laterality: N/A;  830  . FRACTURE SURGERY    . HYSTEROSCOPY WITH THERMACHOICE  01/17/2012   Procedure: HYSTEROSCOPY WITH THERMACHOICE;  Surgeon: Florian Buff, MD;  Location: [facility] ORS;  Service: Gynecology;  Laterality: N/A;  total therapy time: 9:13sec  D5W  18 ml in, D5W   33m out, temperture 87degrees celcious  . KIDNEY SURGERY     as child for blockages  . TONSILLECTOMY    . TUBAL LIGATION  1989  . TYMPANOSTOMY TUBE PLACEMENT Bilateral    "several times when I was a child"  . uterine ablation    . WRIST FRACTURE SURGERY Left 1995    Social History   Tobacco Use  . Smoking status: Current Every Day Smoker    Packs/day: 1.50    Years: 39.00    Pack years: 58.50    Types: Cigarettes  . Smokeless tobacco: Never Used  Substance Use Topics  . Alcohol use: Yes    Comment: 10/05/2017 "5 - 8 shots tequilia//month"    Family Hx: Family History  Problem Relation Age of Onset  . Heart attack Father 4110      deceased, etoh use  . Heart disease Father   . Alcohol abuse Father   . Depression Father   . Heart attack Mother 579      deceased  . Diabetes Mother   . Breast cancer Mother   . Heart failure Mother        oxygen dependence, nonsmoker  . Heart disease  Mother   . Depression Mother   . Cancer Mother   . Liver disease Maternal AAunt 44      died while on liver transplant list  . Heart attack Maternal Grandmother        premature CAD  . Ulcers Sister   . Hypertension Sister   . Colon cancer Neg Hx      Medications: Current Outpatient Medications  Medication Sig Dispense Refill  . ALPRAZolam (XANAX) 0.5 MG tablet Take 1 tablet (0.5 mg total) by mouth daily as needed for anxiety. 30 tablet 2  . amphetamine-dextroamphetamine (ADDERALL) 20 MG tablet Take 1 tablet (20 mg total) by mouth 2 (two) times daily. 60 tablet 0  . canagliflozin (INVOKANA) 300 MG TABS tablet Take 1 tablet (300 mg total) by mouth daily before breakfast. 60 tablet 0  . DULoxetine (CYMBALTA) 60 MG capsule Take 1 capsule (60 mg total) by mouth 2 (two) times daily. (Patient taking differently: Take 60 mg by mouth at bedtime. ) 60 capsule 2  . losartan-hydrochlorothiazide (HYZAAR) 100-12.5 MG tablet Take 1 tablet by mouth daily. 60 tablet 0  . pantoprazole (PROTONIX) 40 MG tablet Take 1 tablet (40 mg total) by mouth daily. 30 minutes before a meal. 30 tablet 1  . pregabalin (LYRICA) 75 MG capsule Take 1 capsule (75 mg total) by mouth 3 (three) times  daily. 90 capsule 5  . acetaminophen (TYLENOL) 500 MG tablet Take 1,000 mg by mouth every 6 (six) hours as needed for mild pain or headache.    [name] atorvastatin (LIPITOR) 40 MG tablet Take 1 tablet (40 mg total) by mouth daily at 6 PM. (Patient taking differently: Take 10 mg by mouth daily at 6 PM. ) 30 tablet 0  . liraglutide (VICTOZA) 18 MG/3ML SOPN 0.'6mg'$  [place] qd * 1 wk, then 1.'2mg'$  [place] qd as tolerated (Patient not taking: Reported on 11/17/2017) 6 pen 1  . Vitamin D, Ergocalciferol, (DRISDOL) 50000 units CAPS capsule Take 1 capsule (50,000 Units total) by mouth every 7 (seven) days. (Patient not taking: Reported on 11/17/2017) 12 capsule 10   No current facility-administered medications for this visit.     Allergies:  Allergies    Allergen Reactions  . Codeine Itching and Nausea Only  . Metformin And Related Diarrhea and Other (See Comments)    Stomach pain/nausea  . Wellbutrin [Bupropion Hcl] Hives  . Acyclovir And Related Rash     Review of Systems: General:   No F/C, wt loss Pulm:   No DIB, SOB, pleuritic chest pain Card:  No CP, palpitations Abd:  No n/v/d or pain Ext:  No inc edema from baseline  Objective:  Blood pressure 124/84, pulse (!) 110, height '5\' 4"'$  (1.626 m), weight 261 lb 4.8 oz (118.5 kg), SpO2 95 %. Body mass index is 44.85 kg/m. Gen:   Well NAD, A and O *3 HEENT:    [facility]/AT, EOMI,  MMM Lungs:   Normal work of breathing. CTA B/L, no Wh, rhonchi Heart:   RRR, S1, S2 WNL's, no MRG Abd:   No gross distention Exts:    warm, pink,  Brisk capillary refill, warm and well perfused.  Psych:    No HI/SI, judgement and insight good, Euthymic mood. Full Affect.   Recent Results (from the past 2160 hour(s))  Comprehensive metabolic panel     Status: Abnormal   Collection Time: 09/14/17  4:35 PM  Result Value Ref Range   Sodium 135 135 - 145 mmol/L   Potassium 3.7 3.5 - 5.1 mmol/L   Chloride 97 (L) 101 - 111 mmol/L   CO2 27 22 - 32 mmol/L   Glucose, Bld 233 (H) 65 - 99 mg/dL   BUN <5 (L) 6 - 20 mg/dL   Creatinine, Ser 0.67 0.44 - 1.00 mg/dL   Calcium 9.2 8.9 - 10.3 mg/dL   Total Protein 7.3 6.5 - 8.1 g/dL   Albumin 3.6 3.5 - 5.0 g/dL   AST 25 15 - 41 U/L   ALT 23 14 - 54 U/L   Alkaline Phosphatase 64 38 - 126 U/L   Total Bilirubin 0.6 0.3 - 1.2 mg/dL   GFR calc non Af Amer >60 >60 mL/min   GFR calc Af Amer >60 >60 mL/min    Comment: (NOTE) The eGFR has been calculated using the CKD EPI equation. This calculation has not been validated in all clinical situations. eGFR's persistently <60 mL/min signify possible Chronic Kidney Disease.    Anion gap 11 5 - 15    Comment: Performed at [facility] [address] [place], [place] [address]  CBC with Differential     Status:  Abnormal   Collection Time: 09/14/17  4:35 PM  Result Value Ref Range   WBC 13.5 (H) 4.0 - 10.5 K/uL   RBC 5.45 (H) 3.87 - 5.11 MIL/uL   Hemoglobin 14.3 12.0 -  15.0 g/dL   HCT 45.8 36.0 - 46.0 %   MCV 84.0 78.0 - 100.0 fL   MCH 26.2 26.0 - 34.0 pg   MCHC 31.2 30.0 - 36.0 g/dL   RDW 16.2 (H) 11.5 - 15.5 %   Platelets 284 150 - 400 K/uL   Neutrophils Relative % 63 %   Neutro Abs 8.6 (H) 1.7 - 7.7 K/uL   Lymphocytes Relative 28 %   Lymphs Abs 3.8 0.7 - 4.0 K/uL   Monocytes Relative 7 %   Monocytes Absolute 0.9 0.1 - 1.0 K/uL   Eosinophils Relative 1 %   Eosinophils Absolute 0.1 0.0 - 0.7 K/uL   Basophils Relative 0 %   Basophils Absolute 0.1 0.0 - 0.1 K/uL   Immature Granulocytes 1 %   Abs Immature Granulocytes 0.1 0.0 - 0.1 K/uL    Comment: Performed at [facility] [address], [place], [place] [address]  I-Stat beta hCG blood, ED     Status: None   Collection Time: 09/14/17  4:40 PM  Result Value Ref Range   I-stat hCG, quantitative <5.0 <5 mIU/mL   Comment 3            Comment:   GEST. AGE      CONC.  (mIU/mL)   <=1 WEEK        5 - 50     2 WEEKS       50 - 500     3 WEEKS       100 - 10,000     4 WEEKS     1,000 - 30,000        FEMALE AND NON-PREGNANT FEMALE:     LESS THAN 5 mIU/mL   I-stat troponin, ED     Status: None   Collection Time: 09/14/17  4:40 PM  Result Value Ref Range   Troponin i, poc 0.00 0.00 - 0.08 ng/mL   Comment 3            Comment: Due to the release kinetics of cTnI, a negative result within the first hours of the onset of symptoms does not rule out myocardial infarction with certainty. If myocardial infarction is still suspected, repeat the test at appropriate intervals.   I-Stat CG4 Lactic Acid, ED     Status: Abnormal   Collection Time: 09/14/17  4:42 PM  Result Value Ref Range   Lactic Acid, Venous 2.28 (HH) 0.5 - 1.9 mmol/L   Comment NOTIFIED PHYSICIAN   I-Stat CG4 Lactic Acid, ED     Status: Abnormal   Collection Time:  09/14/17  7:00 PM  Result Value Ref Range   Lactic Acid, Venous 2.64 (HH) 0.5 - 1.9 mmol/L   Comment NOTIFIED PHYSICIAN   Urinalysis, Routine w reflex microscopic     Status: Abnormal   Collection Time: 09/14/17  7:15 PM  Result Value Ref Range   Color, Urine YELLOW YELLOW   APPearance HAZY (A) CLEAR   Specific Gravity, Urine 1.015 1.005 - 1.030   pH 6.0 5.0 - 8.0   Glucose, UA >=500 (A) NEGATIVE mg/dL   Hgb urine dipstick SMALL (A) NEGATIVE   Bilirubin Urine NEGATIVE NEGATIVE   Ketones, ur NEGATIVE NEGATIVE mg/dL   Protein, ur NEGATIVE NEGATIVE mg/dL   Nitrite NEGATIVE NEGATIVE   Leukocytes, UA NEGATIVE NEGATIVE   RBC / HPF 0-5 0 - 5 RBC/hpf   WBC, UA 0-5 0 - 5 WBC/hpf   Bacteria, UA RARE (A) NONE SEEN  Squamous Epithelial / LPF 6-10 0 - 5   Mucus PRESENT     Comment: Performed at [facility], [place] [address] [place], [place] [address]  I-stat troponin, ED     Status: None   Collection Time: 09/14/17 10:17 PM  Result Value Ref Range   Troponin i, poc 0.01 0.00 - 0.08 ng/mL   Comment 3            Comment: Due to the release kinetics of cTnI, a negative result within the first hours of the onset of symptoms does not rule out myocardial infarction with certainty. If myocardial infarction is still suspected, repeat the test at appropriate intervals.   POCT glycosylated hemoglobin (Hb A1C)     Status: Abnormal   Collection Time: 10/01/17  4:19 PM  Result Value Ref Range   Hemoglobin A1C 10.2 (A) 4.0 - 5.6 %   HbA1c, POC (prediabetic range)  5.7 - 6.4 %   HbA1c, POC (controlled diabetic range)  0.0 - 7.0 %  POCT UA - Microalbumin     Status: Abnormal   Collection Time: 10/01/17  5:15 PM  Result Value Ref Range   Microalbumin Ur, POC 10 mg/L   Creatinine, POC 50 mg/dL   Albumin/Creatinine Ratio, Urine, POC 82-956   Basic metabolic panel     Status: Abnormal   Collection Time: 10/05/17  8:46 AM  Result Value Ref Range   Sodium 136 135 - 145 mmol/L   Potassium 4.5  3.5 - 5.1 mmol/L   Chloride 99 (L) 101 - 111 mmol/L   CO2 26 22 - 32 mmol/L   Glucose, Bld 262 (H) 65 - 99 mg/dL   BUN 10 6 - 20 mg/dL   Creatinine, Ser 0.87 0.44 - 1.00 mg/dL   Calcium 9.3 8.9 - 10.3 mg/dL   GFR calc non Af Amer >60 >60 mL/min   GFR calc Af Amer >60 >60 mL/min    Comment: (NOTE) The eGFR has been calculated using the CKD EPI equation. This calculation has not been validated in all clinical situations. eGFR's persistently <60 mL/min signify possible Chronic Kidney Disease.    Anion gap 11 5 - 15    Comment: Performed at [facility] [address], [place], [place] [address]  CBC     Status: Abnormal   Collection Time: 10/05/17  8:46 AM  Result Value Ref Range   WBC 14.6 (H) 4.0 - 10.5 K/uL   RBC 5.71 (H) 3.87 - 5.11 MIL/uL   Hemoglobin 15.0 12.0 - 15.0 g/dL   HCT 48.9 (H) 36.0 - 46.0 %   MCV 85.6 78.0 - 100.0 fL   MCH 26.3 26.0 - 34.0 pg   MCHC 30.7 30.0 - 36.0 g/dL   RDW 16.1 (H) 11.5 - 15.5 %   Platelets 292 150 - 400 K/uL    Comment: Performed at [facility], [place] [address] [place], [place] [address]  Save smear     Status: None   Collection Time: 10/05/17  8:46 AM  Result Value Ref Range   Smear Review SMEAR STAINED AND AVAILABLE FOR REVIEW     Comment: Performed at [facility] [address], [place], [place] [address]  I-stat troponin, ED     Status: None   Collection Time: 10/05/17  9:12 AM  Result Value Ref Range   Troponin i, poc 0.00 0.00 - 0.08 ng/mL   Comment 3  Comment: Due to the release kinetics of cTnI, a negative result within the first hours of the onset of symptoms does not rule out myocardial infarction with certainty. If myocardial infarction is still suspected, repeat the test at appropriate intervals.   I-stat troponin, ED     Status: None   Collection Time: 10/05/17 11:02 AM  Result Value Ref Range   Troponin i, poc 0.00 0.00 - 0.08 ng/mL   Comment 3            Comment: Due to the release  kinetics of cTnI, a negative result within the first hours of the onset of symptoms does not rule out myocardial infarction with certainty. If myocardial infarction is still suspected, repeat the test at appropriate intervals.   I-Stat Troponin, ED (not at [facility])     Status: None   Collection Time: 10/05/17  2:02 PM  Result Value Ref Range   Troponin i, poc 0.00 0.00 - 0.08 ng/mL   Comment 3            Comment: Due to the release kinetics of cTnI, a negative result within the first hours of the onset of symptoms does not rule out myocardial infarction with certainty. If myocardial infarction is still suspected, repeat the test at appropriate intervals.   Glucose, capillary     Status: Abnormal   Collection Time: 10/05/17  6:10 PM  Result Value Ref Range   Glucose-Capillary 255 (H) 65 - 99 mg/dL  Troponin I (q 6hr x 3)     Status: None   Collection Time: 10/05/17  7:39 PM  Result Value Ref Range   Troponin I <0.03 <0.03 ng/mL    Comment: Performed at [facility] [address] [place], [place] [address]  HIV antibody (Routine Testing)     Status: None   Collection Time: 10/05/17  7:39 PM  Result Value Ref Range   HIV Screen 4th Generation wRfx Non Reactive Non Reactive    Comment: (NOTE) Performed At: [facility] [place], [place] [id number] Rush Farmer MD [id number] Performed at [facility], [place] [address] [place], [place] [address]   TSH     Status: None   Collection Time: 10/05/17  7:39 PM  Result Value Ref Range   TSH 1.961 0.350 - 4.500 uIU/mL    Comment: Performed by a 3rd Generation assay with a functional sensitivity of <=0.01 uIU/mL. Performed at [facility], [place] [address] [place], [place] [address]   Glucose, capillary     Status: Abnormal   Collection Time: 10/05/17  9:17 PM  Result Value Ref Range   Glucose-Capillary 289 (H) 65 - 99 mg/dL  Lipid panel     Status: Abnormal   Collection Time: 10/06/17   3:18 AM  Result Value Ref Range   Cholesterol 192 0 - 200 mg/dL   Triglycerides 177 (H) <150 mg/dL   HDL 25 (L) >40 mg/dL   Total CHOL/HDL Ratio 7.7 RATIO   VLDL 35 0 - 40 mg/dL   LDL Cholesterol 132 (H) 0 - 99 mg/dL    Comment:        Total Cholesterol/HDL:CHD Risk Coronary Heart Disease Risk Table                     Men   Women  1/2 Average Risk   3.4   3.3  Average Risk       5.0   4.4  2 X Average Risk   9.6   7.1  3 X Average Risk  23.4   11.0        Use the calculated Patient Ratio above and the CHD Risk Table to determine the patient's CHD Risk.        ATP III CLASSIFICATION (LDL):  <100     mg/dL   Optimal  100-129  mg/dL   Near or Above                    Optimal  130-159  mg/dL   Borderline  160-189  mg/dL   High  >190     mg/dL   Very High Performed at [facility] [address], [place], [place] [address]   Comprehensive metabolic panel     Status: Abnormal   Collection Time: 10/06/17  3:18 AM  Result Value Ref Range   Sodium 136 135 - 145 mmol/L   Potassium 3.7 3.5 - 5.1 mmol/L    Comment: DELTA CHECK NOTED   Chloride 100 (L) 101 - 111 mmol/L   CO2 27 22 - 32 mmol/L   Glucose, Bld 202 (H) 65 - 99 mg/dL   BUN 14 6 - 20 mg/dL   Creatinine, Ser 0.87 0.44 - 1.00 mg/dL   Calcium 8.6 (L) 8.9 - 10.3 mg/dL   Total Protein 6.6 6.5 - 8.1 g/dL   Albumin 3.3 (L) 3.5 - 5.0 g/dL   AST 17 15 - 41 U/L   ALT 22 14 - 54 U/L   Alkaline Phosphatase 58 38 - 126 U/L   Total Bilirubin 0.6 0.3 - 1.2 mg/dL   GFR calc non Af Amer >60 >60 mL/min   GFR calc Af Amer >60 >60 mL/min    Comment: (NOTE) The eGFR has been calculated using the CKD EPI equation. This calculation has not been validated in all clinical situations. eGFR's persistently <60 mL/min signify possible Chronic Kidney Disease.    Anion gap 9 5 - 15    Comment: Performed at [facility] [address] [place], [place] [address]  CBC with Differential/Platelet     Status: Abnormal   Collection  Time: 10/06/17  3:18 AM  Result Value Ref Range   WBC 12.3 (H) 4.0 - 10.5 K/uL   RBC 5.19 (H) 3.87 - 5.11 MIL/uL   Hemoglobin 13.6 12.0 - 15.0 g/dL   HCT 43.9 36.0 - 46.0 %   MCV 84.6 78.0 - 100.0 fL   MCH 26.2 26.0 - 34.0 pg   MCHC 31.0 30.0 - 36.0 g/dL   RDW 15.9 (H) 11.5 - 15.5 %   Platelets 258 150 - 400 K/uL   Neutrophils Relative % 58 %   Neutro Abs 7.2 1.7 - 7.7 K/uL   Lymphocytes Relative 33 %   Lymphs Abs 4.0 0.7 - 4.0 K/uL   Monocytes Relative 6 %   Monocytes Absolute 0.7 0.1 - 1.0 K/uL   Eosinophils Relative 2 %   Eosinophils Absolute 0.3 0.0 - 0.7 K/uL   Basophils Relative 0 %   Basophils Absolute 0.1 0.0 - 0.1 K/uL   Immature Granulocytes 1 %   Abs Immature Granulocytes 0.1 0.0 - 0.1 K/uL    Comment: Performed at [facility] [address], [place], [place] [address]  Glucose, capillary     Status: Abnormal   Collection Time: 10/06/17  7:49 AM  Result Value Ref Range   Glucose-Capillary 217 (H) 65 -  99 mg/dL  ECHOCARDIOGRAM COMPLETE     Status: None   Collection Time: 10/06/17 10:18 AM  Result Value Ref Range   Weight 4,204.8 oz   Height 64 in   BP 138/78 mmHg  Glucose, capillary     Status: Abnormal   Collection Time: 10/06/17 11:14 AM  Result Value Ref Range   Glucose-Capillary 258 (H) 65 - 99 mg/dL  T4, free     Status: None   Collection Time: 11/08/17  8:59 AM  Result Value Ref Range   Free T4 1.29 0.82 - 1.77 ng/dL  VITAMIN D 25 Hydroxy (Vit-D Deficiency, Fractures)     Status: Abnormal   Collection Time: 11/08/17  8:59 AM  Result Value Ref Range   Vit D, 25-Hydroxy 20.9 (L) 30.0 - 100.0 ng/mL    Comment: Vitamin D deficiency has been defined by the [place] and an Endocrine Society practice guideline as a level of serum 25-OH vitamin D less than 20 ng/mL (1,2). The Endocrine Society went on to further define vitamin D insufficiency as a level between 21 and 29 ng/mL (2). 1. IOM (Institute of Medicine). 2010. Dietary  reference    intakes for calcium and D. [place]: The    [employer]. 2. Holick MF, Binkley Haledon, Bischoff-Ferrari HA, et al.    Evaluation, treatment, and prevention of vitamin D    deficiency: an Endocrine Society clinical practice    guideline. JCEM. 2011 Jul; 96(7):1911-30.   NuSwab BV and Candida, NAA     Status: None   Collection Time: 11/12/17  4:26 PM  Result Value Ref Range   Atopobium vaginae Low - 0 Score   BVAB 2 Low - 0 Score   Megasphaera 1 Low - 0 Score    Comment: Calculate total score by adding the 3 individual bacterial vaginosis (BV) marker scores together.  Total score is interpreted as follows: Total score 0-1: Indicates the absence of BV. Total score   2: Indeterminate for BV. Additional clinical                  data should be evaluated to establish a                  diagnosis. Total score 3-6: Indicates the presence of BV. This test was developed and its performance characteristics determined by [facility].  It has not been cleared or approved by the Food and Drug Administration.  The FDA has determined that such clearance or approval is not necessary.    Candida albicans, NAA Negative Negative   Candida glabrata, NAA Negative Negative    Comment: This test was developed and its performance characteristics determined by [facility].  It has not been cleared or approved by the Food and Drug Administration.  The FDA has determined that such clearance or approval is not necessary.   CBG monitoring, ED     Status: Abnormal   Collection Time: 11/17/17 10:09 AM  Result Value Ref Range   Glucose-Capillary 194 (H) 70 - 99 mg/dL   Comment 1 Notify RN    Comment 2 Document in Chart   Protime-INR     Status: None   Collection Time: 11/17/17 10:10 AM  Result Value Ref Range   Prothrombin Time 12.5 11.4 - 15.2 seconds   INR 0.94     Comment: Performed at [facility], [place] [address] [place], [place] [address]  APTT     Status: None  Collection Time: 11/17/17 10:10 AM  Result Value Ref Range   aPTT 30 24 - 36 seconds    Comment: Performed at [facility], [place] [address], [place], [place] [address]  CBC     Status: Abnormal   Collection Time: 11/17/17 10:10 AM  Result Value Ref Range   WBC 12.9 (H) 4.0 - 10.5 K/uL   RBC 5.63 (H) 3.87 - 5.11 MIL/uL   Hemoglobin 14.7 12.0 - 15.0 g/dL   HCT 48.3 (H) 36.0 - 46.0 %   MCV 85.8 78.0 - 100.0 fL   MCH 26.1 26.0 - 34.0 pg   MCHC 30.4 30.0 - 36.0 g/dL   RDW 14.8 11.5 - 15.5 %   Platelets 272 150 - 400 K/uL    Comment: Performed at [facility], [place] [address], [place], [place] [address]  Differential     Status: Abnormal   Collection Time: 11/17/17 10:10 AM  Result Value Ref Range   Neutrophils Relative % 63 %   Neutro Abs 8.2 (H) 1.7 - 7.7 K/uL   Lymphocytes Relative 28 %   Lymphs Abs 3.5 0.7 - 4.0 K/uL   Monocytes Relative 6 %   Monocytes Absolute 0.8 0.1 - 1.0 K/uL   Eosinophils Relative 2 %   Eosinophils Absolute 0.2 0.0 - 0.7 K/uL   Basophils Relative 1 %   Basophils Absolute 0.1 0.0 - 0.1 K/uL   Immature Granulocytes 0 %   Abs Immature Granulocytes 0.1 0.0 - 0.1 K/uL    Comment: Performed at [facility] [address], [place], [place] [address]  Comprehensive metabolic panel     Status: Abnormal   Collection Time: 11/17/17 10:10 AM  Result Value Ref Range   Sodium 138 135 - 145 mmol/L   Potassium 4.0 3.5 - 5.1 mmol/L   Chloride 96 (L) 98 - 111 mmol/L   CO2 28 22 - 32 mmol/L   Glucose, Bld 204 (H) 70 - 99 mg/dL   BUN 7 6 - 20 mg/dL   Creatinine, Ser 0.75 0.44 - 1.00 mg/dL   Calcium 9.1 8.9 - 10.3 mg/dL   Total Protein 7.0 6.5 - 8.1 g/dL   Albumin 3.6 3.5 - 5.0 g/dL   AST 17 15 - 41 U/L   ALT 20 0 - 44 U/L   Alkaline Phosphatase 60 38 - 126 U/L   Total Bilirubin 0.7 0.3 - 1.2 mg/dL   GFR calc non Af Amer >60 >60 mL/min   GFR calc Af Amer >60 >60 mL/min    Comment: (NOTE) The eGFR has been calculated using the CKD EPI  equation. This calculation has not been validated in all clinical situations. eGFR's persistently <60 mL/min signify possible Chronic Kidney Disease.    Anion gap 14 5 - 15    Comment: Performed at [facility] [address] [place], [place] [address]  I-stat troponin, ED     Status: None   Collection Time: 11/17/17 10:15 AM  Result Value Ref Range   Troponin i, poc 0.00 0.00 - 0.08 ng/mL   Comment 3            Comment: Due to the release kinetics of cTnI, a negative result within the first hours of the onset of symptoms does not rule out myocardial infarction with certainty. If myocardial infarction is still suspected, repeat the test at appropriate intervals.   I-Stat beta hCG blood, ED     Status: Abnormal  Collection Time: 11/17/17 10:15 AM  Result Value Ref Range   I-stat hCG, quantitative 5.2 (H) <5 mIU/mL   Comment 3            Comment:   GEST. AGE      CONC.  (mIU/mL)   <=1 WEEK        5 - 50     2 WEEKS       50 - 500     3 WEEKS       100 - 10,000     4 WEEKS     1,000 - 30,000        FEMALE AND NON-PREGNANT FEMALE:     LESS THAN 5 mIU/mL   I-Stat Chem 8, ED     Status: Abnormal   Collection Time: 11/17/17 10:17 AM  Result Value Ref Range   Sodium 137 135 - 145 mmol/L   Potassium 4.0 3.5 - 5.1 mmol/L   Chloride 99 98 - 111 mmol/L   BUN 8 6 - 20 mg/dL   Creatinine, Ser 0.70 0.44 - 1.00 mg/dL   Glucose, Bld 202 (H) 70 - 99 mg/dL   Calcium, Ion 1.07 (L) 1.15 - 1.40 mmol/L   TCO2 27 22 - 32 mmol/L   Hemoglobin 15.6 (H) 12.0 - 15.0 g/dL   HCT 46.0 36.0 - 46.0 %  hCG, serum, qualitative     Status: None   Collection Time: 11/17/17 10:54 AM  Result Value Ref Range   Preg, Serum NEGATIVE NEGATIVE    Comment: Performed at [facility], [place] [address], [place], [place] [address]

## 2017-11-12 NOTE — Patient Instructions (Signed)
Please follow-up in 3 months for recheck of your labs- come fasting.  Then OV with me 1 week later    Diabetes Mellitus and Standards of Medical Care  Managing diabetes (diabetes mellitus) can be complicated. Your diabetes treatment may be managed by a team of health care providers, including:  A diet and nutrition specialist (registered dietitian).  A nurse.  A certified diabetes educator (CDE).  A diabetes specialist (endocrinologist).  An eye doctor.  A primary care provider.  A dentist.  Your health care providers follow a schedule in order to help you get the best quality of care. The following schedule is a general guideline for your diabetes management plan. Your health care providers may also give you more specific instructions.  HbA1c (hemoglobin A1c) test This test provides information about blood sugar (glucose) control over the previous 2-3 months. It is used to check whether your diabetes management plan needs to be adjusted.  If you are meeting your treatment goals, this test is done at least 2 times a year.  If you are not meeting treatment goals or if your treatment goals have changed, this test is done 4 times a year.  Blood pressure test  This test is done at every routine medical visit. For most people, the goal is less than 130/80. Ask your health care provider what your goal blood pressure should be.  Dental and eye exams  Visit your dentist two times a year.  If you have type 1 diabetes, get an eye exam 3-5 years after you are diagnosed, and then once a year after your first exam. ? If you were diagnosed with type 1 diabetes as a child, get an eye exam when you are age 58 or older and have had diabetes for 3-5 years. After the first exam, you should get an eye exam once a year.  If you have type 2 diabetes, have an eye exam as soon as you are diagnosed, and then once a year after your first exam.  Foot care exam  Visual foot exams are done at every  routine medical visit. The exams check for cuts, bruises, redness, blisters, sores, or other problems with the feet.  A complete foot exam is done by your health care provider once a year. This exam includes an inspection of the structure and skin of your feet, and a check of the pulses and sensation in your feet. ? Type 1 diabetes: Get your first exam 3-5 years after diagnosis. ? Type 2 diabetes: Get your first exam as soon as you are diagnosed.  Check your feet every day for cuts, bruises, redness, blisters, or sores. If you have any of these or other problems that are not healing, contact your health care provider.  Kidney function test (urine microalbumin)  This test is done once a year. ? Type 1 diabetes: Get your first test 5 years after diagnosis. ? Type 2 diabetes: Get your first test as soon as you are diagnosed._  If you have chronic kidney disease (CKD), get a serum creatinine and estimated glomerular filtration rate (eGFR) test once a year.  Lipid profile (cholesterol, HDL, LDL, triglycerides)  This test should be done when you are diagnosed with diabetes, and every 5 years after the first test. If you are on medicines to lower your cholesterol, you may need to get this test done every year. ? The goal for LDL is less than 100 mg/dL (5.5 mmol/L). If you are at high risk, the goal  is less than 70 mg/dL (3.9 mmol/L). ? The goal for HDL is 40 mg/dL (2.2 mmol/L) for men and 50 mg/dL(2.8 mmol/L) for women. An HDL cholesterol of 60 mg/dL (3.3 mmol/L) or higher gives some protection against heart disease. ? The goal for triglycerides is less than 150 mg/dL (8.3 mmol/L).  Immunizations  The yearly flu (influenza) vaccine is recommended for everyone 6 months or older who has diabetes.  The pneumonia (pneumococcal) vaccine is recommended for everyone 2 years or older who has diabetes. If you are 57 or older, you may get the pneumonia vaccine as a series of two separate shots.  The  hepatitis B vaccine is recommended for adults shortly after they have been diagnosed with diabetes.  The Tdap (tetanus, diphtheria, and pertussis) vaccine should be given: ? According to normal childhood vaccination schedules, for children. ? Every 10 years, for adults who have diabetes.  The shingles vaccine is recommended for people who have had chicken pox and are 50 years or older.  Mental and emotional health  Screening for symptoms of eating disorders, anxiety, and depression is recommended at the time of diagnosis and afterward as needed. If your screening shows that you have symptoms (you have a positive screening result), you may need further evaluation and be referred to a mental health care provider.  Diabetes self-management education  Education about how to manage your diabetes is recommended at diagnosis and ongoing as needed.  Treatment plan  Your treatment plan will be reviewed at every medical visit.  Summary  Managing diabetes (diabetes mellitus) can be complicated. Your diabetes treatment may be managed by a team of health care providers.  Your health care providers follow a schedule in order to help you get the best quality of care.  Standards of care including having regular physical exams, blood tests, blood pressure monitoring, immunizations, screening tests, and education about how to manage your diabetes.  Your health care providers may also give you more specific instructions based on your individual health.      Type 2 Diabetes Mellitus, Self Care, Adult Caring for yourself after you have been diagnosed with type 2 diabetes (type 2 diabetes mellitus) means keeping your blood sugar (glucose) under control with a balance of:  Nutrition.  Exercise.  Lifestyle changes.  Medicines or insulin, if necessary.  Support from your team of health care providers and others.  The following information explains what you need to know to manage your diabetes  at home. What do I need to do to manage my blood glucose?  Check your blood glucose every day, as often as told by your health care provider.  Contact your health care provider if your blood glucose is above your target for 2 tests in a row.  Have your A1c (hemoglobin A1c) level checked at least two times a year, or as often as told by your health care provider. Your health care provider will set individualized treatment goals for you. Generally, the goal of treatment is to maintain the following blood glucose levels:  Before meals (preprandial): 80-130 mg/dL (4.4-7.2 mmol/L).  After meals (postprandial): below 180 mg/dL (10 mmol/L).  A1c level: less than 7%.  What do I need to know about hyperglycemia and hypoglycemia? What is hyperglycemia? Hyperglycemia, also called high blood glucose, occurs when blood glucose is too high.Make sure you know the early signs of hyperglycemia, such as:  Increased thirst.  Hunger.  Feeling very tired.  Needing to urinate more often than usual.  Blurry  vision.  What is hypoglycemia? Hypoglycemia, also called low blood glucose, occurswith a blood glucose level at or below 70 mg/dL (3.9 mmol/L). The risk for hypoglycemia increases during or after exercise, during sleep, during illness, and when skipping meals or not eating for a long time (fasting). It is important to know the symptoms of hypoglycemia and treat it right away. Always have a 15-gram rapid-acting carbohydrate snack with you to treat low blood glucose. Family members and close friends should also know the symptoms and should understand how to treat hypoglycemia, in case you are not able to treat yourself. What are the symptoms of hypoglycemia? Hypoglycemia symptoms can include:  Hunger.  Anxiety.  Sweating and feeling clammy.  Confusion.  Dizziness or feeling light-headed.  Sleepiness.  Nausea.  Increased heart rate.  Headache.  Blurry  vision.  Seizure.  Nightmares.  Tingling or numbness around the mouth, lips, or tongue.  A change in speech.  Decreased ability to concentrate.  A change in coordination.  Restless sleep.  Tremors or shakes.  Fainting.  Irritability.  How do I treat hypoglycemia?  If you are alert and able to swallow safely, follow the 15:15 rule:  Take 15 grams of a rapid-acting carbohydrate. Rapid-acting options include: ? 1 tube of glucose gel. ? 3 glucose pills. ? 6-8 pieces of hard candy. ? 4 oz (120 mL) of fruit juice. ? 4 oz (120 mL) of regular (not diet) soda.  Check your blood glucose 15 minutes after you take the carbohydrate.  If the repeat blood glucose level is still at or below 70 mg/dL (3.9 mmol/L), take 15 grams of a carbohydrate again.  If your blood glucose level does not increase above 70 mg/dL (3.9 mmol/L) after 3 tries, seek emergency medical care.  After your blood glucose level returns to normal, eat a meal or a snack within 1 hour.  How do I treat severe hypoglycemia? Severe hypoglycemia is when your blood glucose level is at or below 54 mg/dL (3 mmol/L). Severe hypoglycemia is an emergency. Do not wait to see if the symptoms will go away. Get medical help right away. Call your local emergency services (911 in the [place] Do not drive yourself to the hospital. If you have severe hypoglycemia and you cannot eat or drink, you may need an injection of glucagon. A family member or close friend should learn how to check your blood glucose and how to give you a glucagon injection. Ask your health care provider if you need to have an emergency glucagon injection kit available. Severe hypoglycemia may need to be treated in a hospital. The treatment may include getting glucose through an IV tube. You may also need treatment for the cause of your hypoglycemia. Can having diabetes put me at risk for other conditions? Having diabetes can put you at risk for other long-term  (chronic) conditions, such as heart disease and kidney disease. Your health care provider may prescribe medicines to help prevent complications from diabetes. These medicines may include:  Aspirin.  Medicine to lower cholesterol.  Medicine to control blood pressure.  What else can I do to manage my diabetes? Take your diabetes medicines as told  If your health care provider prescribed insulin or diabetes medicines, take them every day.  Do not run out of insulin or other diabetes medicines that you take. Plan ahead so you always have these available.  If you use insulin, adjust your dosage based on how physically active you are and what foods you  eat. Your health care provider will tell you how to adjust your dosage. Make healthy food choices  The things that you eat and drink affect your blood glucose and your insulin dosage. Making good choices helps to control your diabetes and prevent other health problems. A healthy meal plan includes eating lean proteins, complex carbohydrates, fresh fruits and vegetables, low-fat dairy products, and healthy fats. Make an appointment to see a diet and nutrition specialist (registered dietitian) to help you create an eating plan that is right for you. Make sure that you:  Follow instructions from your health care provider about eating or drinking restrictions.  Drink enough fluid to keep your urine clear or pale yellow.  Eat healthy snacks between nutritious meals.  Track the carbohydrates that you eat. Do this by reading food labels and learning the standard serving sizes of foods.  Follow your sick day plan whenever you cannot eat or drink as usual. Make this plan in advance with your health care provider.  Stay active  Exercise regularly, as told by your health care provider. This may include:  Stretching and doing strength exercises, such as yoga or weightlifting, at least 2 times a week.  Doing at least 150 minutes of moderate-intensity  or vigorous-intensity exercise each week. This could be brisk walking, biking, or water aerobics. ? Spread out your activity over at least 3 days of the week. ? Do not go more than 2 days in a row without doing some kind of physical activity.  When you start a new exercise or activity, work with your health care provider to adjust your insulin, medicines, or food intake as needed. Make healthy lifestyle choices  Do not use any tobacco products, such as cigarettes, chewing tobacco, and e-cigarettes. If you need help quitting, ask your health care provider.  If your health care provider says that alcohol is safe for you, limit alcohol intake to no more than 1 drink per day for nonpregnant women and 2 drinks per day for men. One drink equals 12 oz of beer, 5 oz of wine, or 1 oz of hard liquor.  Learn to manage stress. If you need help with this, ask your health care provider. Care for your body   Keep your immunizations up to date. In addition to getting vaccinations as told by your health care provider, it is recommended that you get vaccinated against the following illnesses: ? The flu (influenza). Get a flu shot every year. ? Pneumonia. ? Hepatitis B.  Schedule an eye exam soon after your diagnosis, and then one time every year after that.  Check your skin and feet every day for cuts, bruises, redness, blisters, or sores. Schedule a foot exam with your health care provider once every year.  Brush your teeth and gums two times a day, and floss at least one time a day. Visit your dentist at least once every 6 months.  Maintain a healthy weight. General instructions  Take over-the-counter and prescription medicines only as told by your health care provider.  Share your diabetes management plan with people in your workplace, school, and household.  Check your urine for ketones when you are ill and as told by your health care provider.  Ask your health care provider: ? Do I need to  meet with a diabetes educator? ? Where can I find a support group for people with diabetes?  Carry a medical alert card or wear medical alert jewelry.  Keep all follow-up visits as told by  your health care provider. This is important. Where to find more information: For more information about diabetes, visit:  American Diabetes Association (ADA): www.diabetes.org  American Association of Diabetes Educators (AADE): www.diabeteseducator.org/patient-resources  This information is not intended to replace advice given to you by your health care provider. Make sure you discuss any questions you have with your health care provider. Document Released: 08/02/2015 Document Revised: 09/16/2015 Document Reviewed: 05/14/2015 Elsevier Interactive Patient Education  2017 [place].      Blood Glucose Monitoring, Adult Monitoring your blood sugar (glucose) helps you manage your diabetes. It also helps you and your health care provider determine how well your diabetes management plan is working. Blood glucose monitoring involves checking your blood glucose as often as directed, and keeping a record (log) of your results over time. Why should I monitor my blood glucose? Checking your blood glucose regularly can:  Help you understand how food, exercise, illnesses, and medicines affect your blood glucose.  Let you know what your blood glucose is at any time. You can quickly tell if you are having low blood glucose (hypoglycemia) or high blood glucose (hyperglycemia).  Help you and your health care provider adjust your medicines as needed.  When should I check my blood glucose? Follow instructions from your health care provider about how often to check your blood glucose.   This may depend on:  The type of diabetes you have.  How well-controlled your diabetes is.  Medicines you are taking.  If you have type 1 diabetes:  Check your blood glucose at least 2 times a day.  Also check your  blood glucose: ? Before every insulin injection. ? Before and after exercise. ? Between meals. ? 2 hours after a meal. ? Occasionally between 2:00 a.m. and 3:00 a.m., as directed. ? Before potentially dangerous tasks, like driving or using heavy machinery. ? At bedtime.  You may need to check your blood glucose more often, up to 6-10 times a day: ? If you use an insulin pump. ? If you need multiple daily injections (MDI). ? If your diabetes is not well-controlled. ? If you are ill. ? If you have a history of severe hypoglycemia. ? If you have a history of not knowing when your blood glucose is getting low (hypoglycemia unawareness).  If you have type 2 diabetes:  If you take insulin or other diabetes medicines, check your blood glucose at least 2 times a day.  If you are on intensive insulin therapy, check your blood glucose at least 4 times a day. Occasionally, you may also need to check between 2:00 a.m. and 3:00 a.m., as directed.  Also check your blood glucose: ? Before and after exercise. ? Before potentially dangerous tasks, like driving or using heavy machinery.  You may need to check your blood glucose more often if: ? Your medicine is being adjusted. ? Your diabetes is not well-controlled. ? You are ill.  What is a blood glucose log?  A blood glucose log is a record of your blood glucose readings. It helps you and your health care provider: ? Look for patterns in your blood glucose over time. ? Adjust your diabetes management plan as needed.  Every time you check your blood glucose, write down your result and notes about things that may be affecting your blood glucose, such as your diet and exercise for the day.  Most glucose meters store a record of glucose readings in the meter. Some meters allow you to download your  records to a computer. How do I check my blood glucose? Follow these steps to get accurate readings of your blood glucose: Supplies  needed   Blood glucose meter.  Test strips for your meter. Each meter has its own strips. You must use the strips that come with your meter.  A needle to prick your finger (lancet). Do not use lancets more than once.  A device that holds the lancet (lancing device).  A journal or log book to write down your results.  Procedure  Wash your hands with soap and water.  Prick the side of your finger (not the tip) with the lancet. Use a different finger each time.  Gently rub the finger until a small drop of blood appears.  Follow instructions that come with your meter for inserting the test strip, applying blood to the strip, and using your blood glucose meter.  Write down your result and any notes.  Alternative testing sites  Some meters allow you to use areas of your body other than your finger (alternative sites) to test your blood.  If you think you may have hypoglycemia, or if you have hypoglycemia unawareness, do not use alternative sites. Use your finger instead.  Alternative sites may not be as accurate as the fingers, because blood flow is slower in these areas. This means that the result you get may be delayed, and it may be different from the result that you would get from your finger.  The most common alternative sites are: ? Forearm. ? Thigh. ? Palm of the hand.  Additional tips  Always keep your supplies with you.  If you have questions or need help, all blood glucose meters have a 24-hour "hotline" number that you can call. You may also contact your health care provider.  After you use a few boxes of test strips, adjust (calibrate) your blood glucose meter by following instructions that came with your meter.    The American Diabetes Association suggests the following targets for most nonpregnant adults with diabetes.  More or less stringent glycemic goals may be appropriate for each individual.  A1C: Less than 7% A1C may also be reported as eAG: Less than  154 mg/dl Before a meal (preprandial plasma glucose): 80-130 mg/dl 1-2 hours after beginning of the meal (Postprandial plasma glucose)*: Less than 180 mg/dl  *Postprandial glucose may be targeted if A1C goals are not met despite reaching preprandial glucose goals.   GOALS in short:  The goals are for the Hgb A1C to be less than 7.0 & blood pressure to be less than 130/80.    It is recommended that all diabetics are educated on and follow a healthy diabetic diet, exercise for 30 minutes 3-4 times per week (walking, biking, swimming, or machine), monitor blood glucose readings and bring that record with you to be reviewed at your next office visit.     You should be checking fasting blood sugars- especially after you eat poorly or eat really healthy, and also check 2 hour postprandial blood sugars after largest meal of the day.    Write these down and bring in your log at each office visit.    You will need to be seen every 3 months by the provider managing your Diabetes unless told otherwise by that provider.   You will need yearly eye exams from an eye specialist and foot exams to check the nerves of your feet.  Also, your urine should be checked yearly as well to make sure  excess protein is not present.   If you are checking your blood pressure at home, please record it and bring it to your next office visit.    Follow the Dietary Approaches to Stop Hypertension (DASH) diet (3 servings of fruit and vegetables daily, whole grains, low sodium, low-fat proteins).  See below.    Lastly, when it comes to your cholesterol, the goal is to have the HDL (good cholesterol) >40, and the LDL (bad cholesterol) <100.   It is recommended that you follow a heart healthy, low saturated and trans-fat diet and exercise for 30 minutes at least 5 times a week.     (( Check out the DASH diet = 1.5 Gram Low Sodium Diet   A 1.5 gram sodium diet restricts the amount of sodium in the diet to no more than 1.5 g or  1500 mg daily.  The American Heart Association recommends Americans over the age of 63 to consume no more than 1500 mg of sodium each day to reduce the risk of developing high blood pressure.  Research also shows that limiting sodium may reduce heart attack and stroke risk.  Many foods contain sodium for flavor and sometimes as a preservative.  When the amount of sodium in a diet needs to be low, it is important to know what to look for when choosing foods and drinks.  The following includes some information and guidelines to help make it easier for you to adapt to a low sodium diet.    QUICK TIPS  Do not add salt to food.  Avoid convenience items and fast food.  Choose unsalted snack foods.  Buy lower sodium products, often labeled as "lower sodium" or "no salt added."  Check food labels to learn how much sodium is in 1 serving.  When eating at a restaurant, ask that your food be prepared with less salt or none, if possible.    READING FOOD LABELS FOR SODIUM INFORMATION  The nutrition facts label is a good place to find how much sodium is in foods. Look for products with no more than 400 mg of sodium per serving.  Remember that 1.5 g = 1500 mg.  The food label may also list foods as:  Sodium-free: Less than 5 mg in a serving.  Very low sodium: 35 mg or less in a serving.  Low-sodium: 140 mg or less in a serving.  Light in sodium: 50% less sodium in a serving. For example, if a food that usually has 300 mg of sodium is changed to become light in sodium, it will have 150 mg of sodium.  Reduced sodium: 25% less sodium in a serving. For example, if a food that usually has 400 mg of sodium is changed to reduced sodium, it will have 300 mg of sodium.    CHOOSING FOODS  Grains  Avoid: Salted crackers and snack items. Some cereals, including instant hot cereals. Bread stuffing and biscuit mixes. Seasoned rice or pasta mixes.  Choose: Unsalted snack items. Low-sodium cereals, oats, puffed wheat and  rice, shredded wheat. English muffins and bread. Pasta.  Meats  Avoid: Salted, canned, smoked, spiced, pickled meats, including fish and poultry. Bacon, ham, sausage, cold cuts, hot dogs, anchovies.  Choose: Low-sodium canned tuna and salmon. Fresh or frozen meat, poultry, and fish.  Dairy  Avoid: Processed cheese and spreads. Cottage cheese. Buttermilk and condensed milk. Regular cheese.  Choose: Milk. Low-sodium cottage cheese. Yogurt. Sour cream. Low-sodium cheese.  Fruits and Vegetables  Avoid:  Regular canned vegetables. Regular canned tomato sauce and paste. Frozen vegetables in sauces. Olives. [name]. Relishes. Sauerkraut.  Choose: Low-sodium canned vegetables. Low-sodium tomato sauce and paste. Frozen or fresh vegetables. Fresh and frozen fruit.  Condiments  Avoid: Canned and packaged gravies. Worcestershire sauce. Tartar sauce. Barbecue sauce. Soy sauce. Steak sauce. Ketchup. Onion, garlic, and table salt. Meat flavorings and tenderizers.  Choose: Fresh and dried herbs and spices. Low-sodium varieties of mustard and ketchup. Lemon juice. Tabasco sauce. [place].    SAMPLE 1.5 GRAM SODIUM MEAL PLAN:   Breakfast / Sodium (mg)  1 cup low-fat milk / 143 mg  1 whole-wheat English muffin / 240 mg  1 tbs heart-healthy margarine / 153 mg  1 hard-boiled egg / 139 mg  1 small orange / 0 mg  Lunch / Sodium (mg)  1 cup raw carrots / 76 mg  2 tbs no salt added peanut butter / 5 mg  2 slices whole-wheat bread / 270 mg  1 tbs jelly / 6 mg   cup red grapes / 2 mg  Dinner / Sodium (mg)  1 cup whole-wheat pasta / 2 mg  1 cup low-sodium tomato sauce / 73 mg  3 oz lean ground beef / 57 mg  1 small side salad (1 cup raw spinach leaves,  cup cucumber,  cup yellow bell pepper) with 1 tsp olive oil and 1 tsp red wine vinegar / 25 mg  Snack / Sodium (mg)  1 container low-fat vanilla yogurt / 107 mg  3 graham cracker squares / 127 mg  Nutrient Analysis  Calories: 1745  Protein: 75 g   Carbohydrate: 237 g  Fat: 57 g  Sodium: 1425 mg  Document Released: 04/10/2005 Document Revised: 12/21/2010 Document Reviewed: 07/12/2009  ExitCare Patient Information 2012 [place].))    This information is not intended to replace advice given to you by your health care provider. Make sure you discuss any questions you have with your health care provider. Document Released: 04/13/2003 Document Revised: 10/29/2015 Document Reviewed: 09/20/2015 Elsevier Interactive Patient Education  2017 [employer].

## 2017-11-14 LAB — NUSWAB BV AND CANDIDA, NAA
Candida albicans, NAA: NEGATIVE
Candida glabrata, NAA: NEGATIVE

## 2017-11-17 ENCOUNTER — Emergency Department (HOSPITAL_COMMUNITY)
Admission: EM | Admit: 2017-11-17 | Discharge: 2017-11-17 | Disposition: A | Discharge status: Home / Self Care | Payer: Medicare HMO | Attending: Emergency Medicine | Admitting: Emergency Medicine

## 2017-11-17 ENCOUNTER — Emergency Department (HOSPITAL_COMMUNITY): Payer: Medicare HMO

## 2017-11-17 DIAGNOSIS — E1140 Type 2 diabetes mellitus with diabetic neuropathy, unspecified: Secondary | ICD-10-CM | POA: Insufficient documentation

## 2017-11-17 DIAGNOSIS — Z79899 Other long term (current) drug therapy: Secondary | ICD-10-CM | POA: Diagnosis not present

## 2017-11-17 DIAGNOSIS — F17210 Nicotine dependence, cigarettes, uncomplicated: Secondary | ICD-10-CM | POA: Diagnosis not present

## 2017-11-17 DIAGNOSIS — Z7984 Long term (current) use of oral hypoglycemic drugs: Secondary | ICD-10-CM | POA: Diagnosis not present

## 2017-11-17 DIAGNOSIS — R42 Dizziness and giddiness: Secondary | ICD-10-CM | POA: Diagnosis not present

## 2017-11-17 DIAGNOSIS — R220 Localized swelling, mass and lump, head: Secondary | ICD-10-CM | POA: Diagnosis not present

## 2017-11-17 DIAGNOSIS — R200 Anesthesia of skin: Secondary | ICD-10-CM | POA: Insufficient documentation

## 2017-11-17 DIAGNOSIS — R519 Headache, unspecified: Secondary | ICD-10-CM

## 2017-11-17 DIAGNOSIS — J449 Chronic obstructive pulmonary disease, unspecified: Secondary | ICD-10-CM | POA: Insufficient documentation

## 2017-11-17 DIAGNOSIS — I10 Essential (primary) hypertension: Secondary | ICD-10-CM | POA: Insufficient documentation

## 2017-11-17 DIAGNOSIS — R51 Headache: Secondary | ICD-10-CM | POA: Diagnosis not present

## 2017-11-17 DIAGNOSIS — R29818 Other symptoms and signs involving the nervous system: Secondary | ICD-10-CM | POA: Diagnosis not present

## 2017-11-17 LAB — COMPREHENSIVE METABOLIC PANEL
ALT: 20 U/L (ref 0–44)
AST: 17 U/L (ref 15–41)
Albumin: 3.6 g/dL (ref 3.5–5.0)
Alkaline Phosphatase: 60 U/L (ref 38–126)
Anion gap: 14 (ref 5–15)
BUN: 7 mg/dL (ref 6–20)
CO2: 28 mmol/L (ref 22–32)
Calcium: 9.1 mg/dL (ref 8.9–10.3)
Chloride: 96 mmol/L — ABNORMAL LOW (ref 98–111)
Creatinine, Ser: 0.75 mg/dL (ref 0.44–1.00)
GFR calc Af Amer: >60 mL/min (ref >60)
GFR calc non Af Amer: >60 mL/min (ref >60)
Glucose, Bld: 204 mg/dL — ABNORMAL HIGH (ref 70–99)
Potassium: 4 mmol/L (ref 3.5–5.1)
Sodium: 138 mmol/L (ref 135–145)
Total Bilirubin: 0.7 mg/dL (ref 0.3–1.2)
Total Protein: 7 g/dL (ref 6.5–8.1)

## 2017-11-17 LAB — I-STAT CHEM 8, ED
BUN: 8 mg/dL (ref 6–20)
Calcium, Ion: 1.07 mmol/L — ABNORMAL LOW (ref 1.15–1.40)
Chloride: 99 mmol/L (ref 98–111)
Creatinine, Ser: 0.7 mg/dL (ref 0.44–1.00)
Glucose, Bld: 202 mg/dL — ABNORMAL HIGH (ref 70–99)
HCT: 46 % (ref 36.0–46.0)
Hemoglobin: 15.6 g/dL — ABNORMAL HIGH (ref 12.0–15.0)
Potassium: 4 mmol/L (ref 3.5–5.1)
Sodium: 137 mmol/L (ref 135–145)
TCO2: 27 mmol/L (ref 22–32)

## 2017-11-17 LAB — CBG MONITORING, ED: Glucose-Capillary: 194 mg/dL — ABNORMAL HIGH (ref 70–99)

## 2017-11-17 LAB — DIFFERENTIAL
Abs Immature Granulocytes: 0.1 10*3/uL (ref 0.0–0.1)
Basophils Absolute: 0.1 10*3/uL (ref 0.0–0.1)
Basophils Relative: 1 %
Eosinophils Absolute: 0.2 10*3/uL (ref 0.0–0.7)
Eosinophils Relative: 2 %
Immature Granulocytes: 0 %
Lymphocytes Relative: 28 %
Lymphs Abs: 3.5 10*3/uL (ref 0.7–4.0)
Monocytes Absolute: 0.8 10*3/uL (ref 0.1–1.0)
Monocytes Relative: 6 %
Neutro Abs: 8.2 10*3/uL — ABNORMAL HIGH (ref 1.7–7.7)
Neutrophils Relative %: 63 %

## 2017-11-17 LAB — CBC
HCT: 48.3 % — ABNORMAL HIGH (ref 36.0–46.0)
Hemoglobin: 14.7 g/dL (ref 12.0–15.0)
MCH: 26.1 pg (ref 26.0–34.0)
MCHC: 30.4 g/dL (ref 30.0–36.0)
MCV: 85.8 fL (ref 78.0–100.0)
Platelets: 272 10*3/uL (ref 150–400)
RBC: 5.63 MIL/uL — ABNORMAL HIGH (ref 3.87–5.11)
RDW: 14.8 % (ref 11.5–15.5)
WBC: 12.9 10*3/uL — ABNORMAL HIGH (ref 4.0–10.5)

## 2017-11-17 LAB — I-STAT BETA HCG BLOOD, ED (MC, WL, AP ONLY): I-stat hCG, quantitative: 5.2 m[IU]/mL — ABNORMAL HIGH (ref <5)

## 2017-11-17 LAB — I-STAT TROPONIN, ED: Troponin i, poc: 0 ng/mL (ref 0.00–0.08)

## 2017-11-17 LAB — PROTIME-INR
INR: 0.94
Prothrombin Time: 12.5 seconds (ref 11.4–15.2)

## 2017-11-17 LAB — HCG, SERUM, QUALITATIVE: Preg, Serum: NEGATIVE

## 2017-11-17 LAB — APTT: aPTT: 30 seconds (ref 24–36)

## 2017-11-17 MED ORDER — DIPHENHYDRAMINE HCL 50 MG/ML IJ SOLN
25.0000 mg | Freq: Once | INTRAMUSCULAR | Status: AC
  Administered 2017-11-17: 25 mg via INTRAVENOUS
  Filled 2017-11-17: qty 1

## 2017-11-17 MED ORDER — PROCHLORPERAZINE EDISYLATE 10 MG/2ML IJ SOLN
10.0000 mg | Freq: Once | INTRAMUSCULAR | Status: AC
Start: 2017-11-17 — End: 2017-11-17
  Administered 2017-11-17: 10 mg via INTRAVENOUS
  Filled 2017-11-17: qty 2

## 2017-11-17 MED ORDER — SODIUM CHLORIDE 0.9 % IV BOLUS
1000.0000 mL | Freq: Once | INTRAVENOUS | Status: AC
  Administered 2017-11-17: 1000 mL via INTRAVENOUS

## 2017-11-17 NOTE — Discharge Instructions (Signed)
Your work-up today was overall reassuring.  We did not see evidence of stroke on the CT imaging.  As her symptoms improved with the medications for headache, we suspect it was a complicated headache/migraine.  We offered the MRI which he did not want to receive.  Please follow-up with your neurologist and PCP.  If any symptoms change or worsen, please return to the nearest emergency department.

## 2017-11-17 NOTE — ED Notes (Signed)
Pt says that she does not want the MRI, that she feels much better and is ready to go home; MD notified

## 2017-11-17 NOTE — Consult Note (Addendum)
NEURO HOSPITALIST CONSULT NOTE   Requestig physician: Dr. Sherry Ruffing   Reason for Consult: Blurry vision/HA/Dizzy   History obtained from:  Patient     HPI:                                                                                                                                          [name] is an 52 y.o. female  With PMH significant for HTN, DM2, smoking, HLD, morbid Obesity, COPD, diabetic neuropathy who presents to [facility] for blurry vision, HA, and dizziness. Initially called as a code stroke, but the code stroke was canceled.    Patient states that since Thursday 11/15/17 she has had a HA and her vision has been blurry.  She states that the whole left side of her face is numb. Like novocain was injected. The HA had gotten somewhat better yesterday after taking some Excedrin and having a nap. When she went to bed last night she reports that she still had a light HA, but it was the best it had been over the past two days. No history of migraines.  She described the HA as tension type at the top of her head and across her forehead. HA feels the same, but has never been combined with dizziness or numbness. Patient gets dizzy upon standing. She describes this as the room spinning when she stands. Denies light headedness, N/V, CP, SOB, weakness, and unusual numbness or tingling. Patient has diabetic neuropathy that has worsened over the past few months. At baseline she has tingling in bilateral feet. Patient also has had recurrent bell's palsy of the right side of her face. Patient stated that she is a smoker, denies drug use other than Marijuana and states that her last A1c was > 10 just started on victoza yesterday.  Ed course: BG: 202 hcG blood: 5.2, serum qualitative: pending CT head: normal Head CT, ASPECTS 10.    1a Level of Conscious:0 1b LOC Questions: 0 1c LOC Commands: 0 2 Best Gaze: 0 3 Visual: 0 4 Facial Palsy:1  5a Motor Arm - left: 0 5b  Motor Arm - Right: 0 6a Motor Leg - Left: 0 6b Motor Leg - Right: 0 7 Limb Ataxia: 0 8 Sensory:0  9 Best Language:0 10 Dysarthria:1 11 Extinct. and Inattention:0 TOTAL: 2   Past Medical History:  Diagnosis Date  . Anemia   . Anxiety   . Asthmatic bronchitis    normal PFT/ seen by pulmonary no evidence of COPD  . Chronic bronchitis ([address])   . Chronic lower back pain   . Chronic respiratory failure with hypoxia (HCC)    On 2-3 L of oxygen at home  . COPD (chronic obstructive pulmonary disease) ([place])   . Daily headache   .  Depression   . Diabetic peripheral neuropathy ([place])   . Gastric erosions    EGD 08/2010.  [name] GERD (gastroesophageal reflux disease)   . Heavy menses   . High cholesterol   . History of blood transfusion    "related to low HgB" (10/05/2017)  . History of hiatal hernia   . HTN (hypertension)   . Increased urinary protein excretion   . Internal hemorrhoids    Colonoscopy 5/12.  . On home oxygen therapy    "5L at night" (10/05/2017)  . OSA on CPAP   . Osteoarthritis    "back" (10/05/2017)  . Pneumonia    "lots of times" (10/05/2017)  . Shortness of breath   . Tachycardia    never had test done since no insurance  . Type II diabetes mellitus ([place])     Past Surgical History:  Procedure Laterality Date  . CESAREAN SECTION  1988; 1989  . CHOLECYSTECTOMY OPEN  1990  . COLONOSCOPY  09/16/2010   YDX:AJOINO colon/small internal hemorrhoids  . ESOPHAGOGASTRODUODENOSCOPY  09/16/2010   SLF: normal/mild gastritis  . ESOPHAGOGASTRODUODENOSCOPY N/A 10/19/2014   Procedure: ESOPHAGOGASTRODUODENOSCOPY (EGD);  Surgeon: Danie Binder, MD;  Location: [facility];  Service: Endoscopy;  Laterality: N/A;  830  . FRACTURE SURGERY    . HYSTEROSCOPY WITH THERMACHOICE  01/17/2012   Procedure: HYSTEROSCOPY WITH THERMACHOICE;  Surgeon: Florian Buff, MD;  Location: [facility] ORS;  Service: Gynecology;  Laterality: N/A;  total therapy time: 9:13sec  D5W  18 ml in, D5W   73ml out,  temperture 87degrees celcious  . KIDNEY SURGERY     as child for blockages  . TONSILLECTOMY    . TUBAL LIGATION  1989  . TYMPANOSTOMY TUBE PLACEMENT Bilateral    "several times when I was a child"  . uterine ablation    . WRIST FRACTURE SURGERY Left 1995    Family History  Problem Relation Age of Onset  . Heart attack Father 31       deceased, etoh use  . Heart disease Father   . Alcohol abuse Father   . Depression Father   . Heart attack Mother 75       deceased  . Diabetes Mother   . Breast cancer Mother   . Heart failure Mother        oxygen dependence, nonsmoker  . Heart disease Mother   . Depression Mother   . Cancer Mother   . Liver disease Maternal Aunt 55       died while on liver transplant list  . Heart attack Maternal Grandmother        premature CAD  . Ulcers Sister   . Hypertension Sister   . Colon cancer Neg Hx        Social History:  reports that she has been smoking cigarettes.  She has a 58.50 pack-year smoking history. She has never used smokeless tobacco. She reports that she drinks alcohol. She reports that she has current or past drug history. Drug: Marijuana.  Allergies  Allergen Reactions  . Codeine Itching and Nausea Only  . Metformin And Related Diarrhea and Other (See Comments)    Stomach pain/nausea  . Wellbutrin [Bupropion Hcl] Hives  . Acyclovir And Related Rash    MEDICATIONS:  Current Facility-Administered Medications  Medication Dose Route Frequency Provider Last Rate Last Dose  . diphenhydrAMINE (BENADRYL) injection 25 mg  25 mg Intravenous Once Tegeler, Gwenyth Allegra, MD      . prochlorperazine (COMPAZINE) injection 10 mg  10 mg Intravenous Once Tegeler, Gwenyth Allegra, MD      . sodium chloride 0.9 % bolus 1,000 mL  1,000 mL Intravenous Once Tegeler, Gwenyth Allegra, MD       Current Outpatient Medications  Medication  Sig Dispense Refill  . acetaminophen (TYLENOL) 500 MG tablet Take 1,000 mg by mouth every 6 (six) hours as needed for mild pain or headache.    . amphetamine-dextroamphetamine (ADDERALL) 20 MG tablet Take 1 tablet (20 mg total) by mouth 2 (two) times daily. 60 tablet 0  . atorvastatin (LIPITOR) 40 MG tablet Take 1 tablet (40 mg total) by mouth daily at 6 PM. (Patient taking differently: Take 10 mg by mouth daily at 6 PM. ) 30 tablet 0  . canagliflozin (INVOKANA) 300 MG TABS tablet Take 1 tablet (300 mg total) by mouth daily before breakfast. 60 tablet 0  . DULoxetine (CYMBALTA) 60 MG capsule Take 1 capsule (60 mg total) by mouth 2 (two) times daily. (Patient taking differently: Take 60 mg by mouth at bedtime. ) 60 capsule 2  . losartan-hydrochlorothiazide (HYZAAR) 100-12.5 MG tablet Take 1 tablet by mouth daily. 60 tablet 0  . pantoprazole (PROTONIX) 40 MG tablet Take 1 tablet (40 mg total) by mouth daily. 30 minutes before a meal. 30 tablet 1  . pregabalin (LYRICA) 75 MG capsule Take 1 capsule (75 mg total) by mouth 3 (three) times daily. 90 capsule 5  . ALPRAZolam (XANAX) 0.5 MG tablet Take 1 tablet (0.5 mg total) by mouth daily as needed for anxiety. 30 tablet 2  . liraglutide (VICTOZA) 18 MG/3ML SOPN 0.6mg  [place] qd * 1 wk, then 1.2mg  [place] qd as tolerated (Patient not taking: Reported on 11/17/2017) 6 pen 1  . Vitamin D, Ergocalciferol, (DRISDOL) 50000 units CAPS capsule Take 1 capsule (50,000 Units total) by mouth every 7 (seven) days. (Patient not taking: Reported on 11/17/2017) 12 capsule 10      ROS:                                                                                                                                       History obtained from the patient  General ROS: negative for - chills, fatigue, fever, night sweats, weight gain or weight loss Ophthalmic ROS: positive for blurry vision negative for - , double vision, eye pain or loss of vision ENT ROS: positive for : numb throat  negative for - epistaxis, nasal discharge, oral lesions, sore throat, tinnitus or vertigo Endocrine ROS: negative for - galactorrhea, hair pattern changes, polydipsia/polyuria or temperature intolerance Respiratory ROS: negative for - cough (outside of usual COPD related cough), hemoptysis, shortness of breath or wheezing Cardiovascular ROS:  negative for - chest pain, dyspnea on exertion, edema or irregular heartbeat Gastrointestinal ROS: negative for - abdominal pain, diarrhea, hematemesis, nausea/vomiting or stool incontinence Musculoskeletal ROS: negative for - joint swelling or muscular weakness Neurological ROS: as noted in HPI Dermatological ROS: negative for rash and skin lesion changes   Blood pressure (!) 157/77, pulse (!) 103, temperature 98.2 F (36.8 C), temperature source Oral, resp. rate 18, SpO2 98 %.   General Examination:                                                                                                       Physical Exam  HEENT-  Normocephalic, no lesions, without obvious abnormality.  Normal external eye and conjunctiva.   Cardiovascular- S1-S2 audible, pulses palpable throughout   Lungs-no rhonchi or wheezing noted, no excessive working breathing.  Saturations within normal limits on RA Abdomen- All 4 quadrants palpated and nontender Extremities- Warm, dry and intact Musculoskeletal-no joint tenderness, deformity or swelling Skin-warm and dry, intact  Neurological Examination Mental Status: Alert, oriented, person/age/month/year/situation.  Speech fluent without evidence of aphasia. mild dysarthria noted. Able to follow  commands without difficulty. Cranial Nerves: II:  Visual fields grossly normal,  III,IV, VI: ptosis not present, extra-ocular motions intact bilaterally pupils equal, round, reactive to light and accommodation V,VII: smile asymmetric on right side, facial light touch sensation numb on left side of face, right side looks droopy d/t  bells.  VIII: hearing normal bilaterally IX,X: uvula rises symmetrically XI: bilateral shoulder shrug XII: midline tongue extension Motor: Right : Upper extremity   5/5    Left:     Upper extremity   5/5  Lower extremity   5/5     Lower extremity   5/5 Tone and bulk:normal tone throughout; no atrophy noted Sensory:light touch intact throughout, bilaterally Deep Tendon Reflexes: 2+ and symmetric throughout Plantars: Right: downgoing   Left: downgoing Cerebellar: normal finger-to-nose, normal rapid alternating movements and normal heel-to-shin test Gait: deferred   Lab Results: Basic Metabolic Panel: Recent Labs  Lab 11/17/17 1010 11/17/17 1017  NA 138 137  K 4.0 4.0  CL 96* 99  CO2 28  --   GLUCOSE 204* 202*  BUN 7 8  CREATININE 0.75 0.70  CALCIUM 9.1  --     CBC: Recent Labs  Lab 11/17/17 1010 11/17/17 1017  WBC 12.9*  --   NEUTROABS 8.2*  --   HGB 14.7 15.6*  HCT 48.3* 46.0  MCV 85.8  --   PLT 272  --     Cardiac Enzymes: No results for input(s): CKTOTAL, CKMB, CKMBINDEX, TROPONINI in the last 168 hours.  Lipid Panel: No results for input(s): CHOL, TRIG, HDL, CHOLHDL, VLDL, LDLCALC in the last 168 hours.  Imaging: Ct Head Code Stroke Wo Contrast`  Result Date: 11/17/2017 CLINICAL DATA:  Code stroke. Focal neuro deficit, greater than 6 hours, stroke suspected. Acute onset of right-sided weakness and blurred vision. EXAM: CT HEAD WITHOUT CONTRAST TECHNIQUE: Contiguous axial images were obtained from the base of the skull through the vertex without intravenous contrast. COMPARISON:  MRI brain 01/23/2012 FINDINGS: Brain: No acute infarct, hemorrhage, or mass lesion is present. The ventricles are of normal size. Basal ganglia are intact. Insular ribbon is normal bilaterally. No acute or focal cortical lesion is present. The brainstem and cerebellum are normal. Vascular: Atherosclerotic calcifications are present at the cavernous internal carotid arteries. There is  no hyperdense vessel. Skull: Calvarium is intact. No focal lytic or blastic lesions are present. Sinuses/Orbits: The paranasal sinuses and mastoid air cells are clear. Globes and orbits are within normal limits. ASPECTS [facility] Stroke Program Early CT Score) - Ganglionic level infarction (caudate, lentiform nuclei, internal capsule, insula, M1-M3 cortex): 7/7 - Supraganglionic infarction (M4-M6 cortex): 3/3 Total score (0-10 with 10 being normal): 10/10 IMPRESSION: 1. Normal CT appearance of the brain. 2. ASPECTS is 10/10 The above was relayed via text pager to Dr. Marda Stalker on 11/17/2017 at 10:37 . Electronically Signed   By: San Morelle M.D.   On: 11/17/2017 10:37      Impression: 52 y.o. female  With PMH significant for HTN, DM2, smoking, HLD, morbid Obesity, COPD, diabetic neuropathy who presents to [facility] for blurry vision, HA, and dizziness. Initially called as a code stroke, but the code stroke was canceled.  #complicated migraine-absence of focal findings, HA- Migraine cocktail #stroke-blurred vision; MRI    Recommendations: -MRI -Migraine cocktail -Page neurology on  Call for any further concerns  Laurey Morale, MSN, NP-C [facility] [phone]  Attending neurologist's note to follow   11/17/2017, 10:59 AM    NEUROHOSPITALIST ADDENDUM Seen and examined the patient today. I have reviewed the contents of history and physical exam as documented by PA/ARNP/Resident and agree with above documentation.  I have discussed and formulated the above plan as documented. Edits to the note have been made as needed.    Karena Addison Wladyslawa Disbro MD [facility] [phone]   If 7pm to 7am, please call on call as listed on AMION.

## 2017-11-17 NOTE — ED Notes (Signed)
Pt taken to RR via wheelchair

## 2017-11-17 NOTE — ED Provider Notes (Signed)
[facility] EMERGENCY DEPARTMENT Provider Note   CSN: [id number] Arrival date & time: 11/17/17  1001     History   Chief Complaint Chief Complaint  Patient presents with  . Code Stroke    HPI [name] is a 52 y.o. female.  The history is provided by the patient, medical records and the spouse.  Neurologic Problem  This is a new problem. The current episode started yesterday. The problem occurs constantly. The problem has not changed since onset.Associated symptoms include headaches. Pertinent negatives include no chest pain, no abdominal pain and no shortness of breath. Nothing aggravates the symptoms. Nothing relieves the symptoms. She has tried nothing for the symptoms. The treatment provided no relief.    Past Medical History:  Diagnosis Date  . Anemia   . Anxiety   . Asthmatic bronchitis    normal PFT/ seen by pulmonary no evidence of COPD  . Chronic bronchitis ([place])   . Chronic lower back pain   . Chronic respiratory failure with hypoxia (HCC)    On 2-3 L of oxygen at home  . COPD (chronic obstructive pulmonary disease) ([place])   . Daily headache   . Depression   . Diabetic peripheral neuropathy ([place])   . Gastric erosions    EGD 08/2010.  [name] GERD (gastroesophageal reflux disease)   . Heavy menses   . High cholesterol   . History of blood transfusion    "related to low HgB" (10/05/2017)  . History of hiatal hernia   . HTN (hypertension)   . Increased urinary protein excretion   . Internal hemorrhoids    Colonoscopy 5/12.  . On home oxygen therapy    "5L at night" (10/05/2017)  . OSA on CPAP   . Osteoarthritis    "back" (10/05/2017)  . Pneumonia    "lots of times" (10/05/2017)  . Shortness of breath   . Tachycardia    never had test done since no insurance  . Type II diabetes mellitus [facility])     Patient Active Problem List   Diagnosis Date Noted  . H/O drug abuse 11/12/2017  . Positive for macroalbuminuria 11/12/2017  . h/o freq  Vaginal yeast infections 11/12/2017  . Poorly controlled diabetes mellitus ([place]) 11/12/2017  . Atypical chest pain 10/05/2017  . COPD with chronic bronchitis ([place]) 10/01/2017  . Mixed diabetic hyperlipidemia associated with type 2 diabetes mellitus ([place]) 10/01/2017  . Tobacco use disorder-current smoker greater than 40-pack-year history- since age 44 1 ppd 10/01/2017  . Tobacco abuse counseling 10/01/2017  . Diabetic peripheral neuropathy ([place]) 10/01/2017  . Iron deficiency anemia 10/01/2017  . Adult attention deficit hyperactivity disorder 01/14/2016  . Hyperlipidemia associated with type 2 diabetes mellitus ([place]) 10/18/2015  . Gastric reflux 10/18/2015  . Dyspepsia 09/23/2014  . Major depressive disorder, recurrent, severe without psychotic features ([place])   . MDD (major depressive disorder), recurrent episode, severe ([place]) 09/13/2014  . Nasal congestion 05/03/2014  . Acute on chronic respiratory failure with hypoxia ([place])   . Chest pain on respiration 03/25/2014  . Leukocytosis 03/25/2014  . Upper airway cough syndrome 02/28/2014  . Abnormal drug screen 04/11/2013  . Sleep apnea 04/11/2013  . Perforated ear drum 05/22/2012  . Bell's palsy 01/23/2012  . Carpal tunnel syndrome 01/23/2012  . Carpal tunnel syndrome on both sides 01/11/2012  . Pulmonary infiltrates 12/22/2011  . Chronic respiratory failure ([place]) 10/16/2011  . Oxygen dependent 10/16/2011  . Peripheral neuropathy 07/07/2011  . Vitamin D deficiency 07/06/2011  .  Asthmatic bronchitis 06/11/2011  . Back pain 06/11/2011  . Morbid obesity ([place]) 05/06/2011  . Depression 05/04/2011  . Gastric erosions 12/05/2010  . Hypertension associated with diabetes ([place]) 12/02/2010  . DM (diabetes mellitus) type II controlled, neurological manifestation ([place]) 12/02/2010  . Cigarette smoker 12/02/2010  . Microcytic anemia 09/08/2010  . GERD (gastroesophageal reflux disease) 09/08/2010  . Esophageal dysphagia 09/08/2010    Past  Surgical History:  Procedure Laterality Date  . CESAREAN SECTION  1988; 1989  . CHOLECYSTECTOMY OPEN  1990  . COLONOSCOPY  09/16/2010   HBZ:JIRCVE colon/small internal hemorrhoids  . ESOPHAGOGASTRODUODENOSCOPY  09/16/2010   SLF: normal/mild gastritis  . ESOPHAGOGASTRODUODENOSCOPY N/A 10/19/2014   Procedure: ESOPHAGOGASTRODUODENOSCOPY (EGD);  Surgeon: Danie Binder, MD;  Location: [facility] ENDO SUITE;  Service: Endoscopy;  Laterality: N/A;  830  . FRACTURE SURGERY    . HYSTEROSCOPY WITH THERMACHOICE  01/17/2012   Procedure: HYSTEROSCOPY WITH THERMACHOICE;  Surgeon: Florian Buff, MD;  Location: [facility] ORS;  Service: Gynecology;  Laterality: N/A;  total therapy time: 9:13sec  D5W  18 ml in, D5W   1ml out, temperture 87degrees celcious  . KIDNEY SURGERY     as child for blockages  . TONSILLECTOMY    . TUBAL LIGATION  1989  . TYMPANOSTOMY TUBE PLACEMENT Bilateral    "several times when I was a child"  . uterine ablation    . WRIST FRACTURE SURGERY Left 1995     OB History    Gravida  2   Para  2   Term  2   Preterm      AB      Living  2     SAB      TAB      Ectopic      Multiple      Live Births               Home Medications    Prior to Admission medications   Medication Sig Start Date End Date Taking? Authorizing Provider  ALPRAZolam Duanne Moron) 0.5 MG tablet Take 1 tablet (0.5 mg total) by mouth daily as needed for anxiety. 10/31/17 10/31/18  Cloria Spring, MD  amphetamine-dextroamphetamine (ADDERALL) 20 MG tablet Take 1 tablet (20 mg total) by mouth 2 (two) times daily. 10/31/17 10/31/18  Cloria Spring, MD  atorvastatin (LIPITOR) 40 MG tablet Take 1 tablet (40 mg total) by mouth daily at 6 PM. 10/06/17   Welford Roche, MD  canagliflozin (INVOKANA) 300 MG TABS tablet Take 1 tablet (300 mg total) by mouth daily before breakfast. 10/01/17 11/30/17  Opalski, Neoma Laming, DO  DULoxetine (CYMBALTA) 60 MG capsule Take 1 capsule (60 mg total) by mouth 2 (two) times daily.  10/31/17   Cloria Spring, MD  ibuprofen (ADVIL,MOTRIN) 200 MG tablet Take 800 mg by mouth every 6 (six) hours as needed.    [provider]  liraglutide (VICTOZA) 18 MG/3ML SOPN 0.6mg  [place] qd * 1 wk, then 1.2mg   qd as tolerated 11/12/17   Opalski, Deborah, DO  losartan-hydrochlorothiazide (HYZAAR) 100-12.5 MG tablet Take 1 tablet by mouth daily. 10/01/17   Opalski, Neoma Laming, DO  pantoprazole (PROTONIX) 40 MG tablet Take 1 tablet (40 mg total) by mouth daily. 30 minutes before a meal. 10/06/17 12/05/17  Santos-Sanchez, Merlene Morse, MD  pregabalin (LYRICA) 75 MG capsule Take 1 capsule (75 mg total) by mouth 3 (three) times daily. 11/12/17   Opalski, Neoma Laming, DO  Pseudoeph-Doxylamine-DM-APAP (NYQUIL MULTI-SYMPTOM PO) Take by mouth daily as needed.  [provider]  Vitamin D, Ergocalciferol, (DRISDOL) 50000 units CAPS capsule Take 1 capsule (50,000 Units total) by mouth every 7 (seven) days. 11/12/17   Mellody Dance, DO    Family History Family History  Problem Relation Age of Onset  . Heart attack Father 59       deceased, etoh use  . Heart disease Father   . Alcohol abuse Father   . Depression Father   . Heart attack Mother 54       deceased  . Diabetes Mother   . Breast cancer Mother   . Heart failure Mother        oxygen dependence, nonsmoker  . Heart disease Mother   . Depression Mother   . Cancer Mother   . Liver disease Maternal Aunt 17       died while on liver transplant list  . Heart attack Maternal Grandmother        premature CAD  . Ulcers Sister   . Hypertension Sister   . Colon cancer Neg Hx     Social History Social History   Tobacco Use  . Smoking status: Current Every Day Smoker    Packs/day: 1.50    Years: 39.00    Pack years: 58.50    Types: Cigarettes  . Smokeless tobacco: Never Used  Substance Use Topics  . Alcohol use: Yes    Comment: 10/05/2017 "5 - 8 shots tequilia//month"  . Drug use: Yes    Types: Marijuana    Comment: 10/05/2017 "a  few times/wk"     Allergies   Acyclovir and related; Codeine; Metformin and related; and Wellbutrin [bupropion hcl]   Review of Systems Review of Systems  Constitutional: Negative for chills, diaphoresis, fatigue and fever.  HENT: Negative for congestion.   Eyes: Positive for photophobia. Negative for visual disturbance.  Respiratory: Negative for cough, chest tightness, shortness of breath, wheezing and stridor.   Cardiovascular: Negative for chest pain and palpitations.  Gastrointestinal: Negative for abdominal pain, constipation, diarrhea, nausea and vomiting.  Genitourinary: Negative for dysuria.  Musculoskeletal: Negative for back pain, neck pain and neck stiffness.  Neurological: Positive for dizziness, facial asymmetry, light-headedness, numbness and headaches. Negative for seizures, speech difficulty and weakness (at baseline).  Psychiatric/Behavioral: Negative for agitation and confusion.  All other systems reviewed and are negative.    Physical Exam Updated Vital Signs BP (!) 157/77 (BP Location: Right Arm)   Pulse (!) 103   Temp 98.2 F (36.8 C) (Oral)   Resp 18   SpO2 98%   Physical Exam  Constitutional: She is oriented to person, place, and time. She appears well-developed and well-nourished. No distress.  HENT:  Head: Atraumatic.  Nose: Nose normal.  Mouth/Throat: Oropharynx is clear and moist. No oropharyngeal exudate.  Eyes: Pupils are equal, round, and reactive to light. Conjunctivae and EOM are normal.  Neck: Normal range of motion.  Cardiovascular: Normal rate and intact distal pulses.  No murmur heard. Pulmonary/Chest: Effort normal. No respiratory distress. She has no wheezes. She exhibits no tenderness.  Abdominal: Soft. Bowel sounds are normal. She exhibits no distension. There is no tenderness.  Musculoskeletal: She exhibits no edema or tenderness.  Neurological: She is alert and oriented to person, place, and time. A cranial nerve deficit and  sensory deficit is present. She exhibits normal muscle tone. Coordination normal.  Patient has right-sided facial droop from old Bell's palsy.  Patient has new left-sided numbness of her face.  Symmetric grip strength and leg strength.  Normal sensation in arms and legs.  Patient reports numbness of the left face compared to the right.  Normal extraocular movements.  Pupils symmetric and reactive bilaterally.  Skin: Capillary refill takes less than 2 seconds. No rash noted. She is not diaphoretic. No erythema.  Psychiatric: She has a normal mood and affect.  Nursing note and vitals reviewed.    ED Treatments / Results  Labs (all labs ordered are listed, but only abnormal results are displayed) Labs Reviewed  CBC - Abnormal; Notable for the following components:      Result Value   WBC 12.9 (*)    RBC 5.63 (*)    HCT 48.3 (*)    All other components within normal limits  DIFFERENTIAL - Abnormal; Notable for the following components:   Neutro Abs 8.2 (*)    All other components within normal limits  COMPREHENSIVE METABOLIC PANEL - Abnormal; Notable for the following components:   Chloride 96 (*)    Glucose, Bld 204 (*)    All other components within normal limits  CBG MONITORING, ED - Abnormal; Notable for the following components:   Glucose-Capillary 194 (*)    All other components within normal limits  I-STAT CHEM 8, ED - Abnormal; Notable for the following components:   Glucose, Bld 202 (*)    Calcium, Ion 1.07 (*)    Hemoglobin 15.6 (*)    All other components within normal limits  I-STAT BETA HCG BLOOD, ED ([facility], [facility], [facility] ONLY) - Abnormal; Notable for the following components:   I-stat hCG, quantitative 5.2 (*)    All other components within normal limits  PROTIME-INR  APTT  HCG, SERUM, QUALITATIVE  I-STAT TROPONIN, ED  CBG MONITORING, ED    EKG EKG Interpretation  Date/Time:  Saturday November 17 2017 10:39:42 EDT Ventricular Rate:  84 PR Interval:    QRS Duration: 91 QT  Interval:  392 QTC Calculation: 464 R Axis:   38 Text Interpretation:  Sinus rhythm Low voltage, extremity leads When compared to prior, no significant changes seen.  No STEMI Confirmed by Antony Blackbird [id number]) on 11/17/2017 10:55:44 AM   Radiology Ct Head Code Stroke Wo Contrast`  Result Date: 11/17/2017 CLINICAL DATA:  Code stroke. Focal neuro deficit, greater than 6 hours, stroke suspected. Acute onset of right-sided weakness and blurred vision. EXAM: CT HEAD WITHOUT CONTRAST TECHNIQUE: Contiguous axial images were obtained from the base of the skull through the vertex without intravenous contrast. COMPARISON:  MRI brain 01/23/2012 FINDINGS: Brain: No acute infarct, hemorrhage, or mass lesion is present. The ventricles are of normal size. Basal ganglia are intact. Insular ribbon is normal bilaterally. No acute or focal cortical lesion is present. The brainstem and cerebellum are normal. Vascular: Atherosclerotic calcifications are present at the cavernous internal carotid arteries. There is no hyperdense vessel. Skull: Calvarium is intact. No focal lytic or blastic lesions are present. Sinuses/Orbits: The paranasal sinuses and mastoid air cells are clear. Globes and orbits are within normal limits. ASPECTS [facility] Stroke Program Early CT Score) - Ganglionic level infarction (caudate, lentiform nuclei, internal capsule, insula, M1-M3 cortex): 7/7 - Supraganglionic infarction (M4-M6 cortex): 3/3 Total score (0-10 with 10 being normal): 10/10 IMPRESSION: 1. Normal CT appearance of the brain. 2. ASPECTS is 10/10 The above was relayed via text pager to Dr. Marda Stalker on 11/17/2017 at 10:37 . Electronically Signed   By: San Morelle M.D.   On: 11/17/2017 10:37    Procedures Procedures (including critical care time)  Medications Ordered  in ED Medications  prochlorperazine (COMPAZINE) injection 10 mg (10 mg Intravenous Given 11/17/17 1121)  diphenhydrAMINE (BENADRYL) injection 25 mg (25  mg Intravenous Given 11/17/17 1118)  sodium chloride 0.9 % bolus 1,000 mL (0 mLs Intravenous Stopped 11/17/17 1232)     Initial Impression / Assessment and Plan / ED Course  I have reviewed the triage vital signs and the nursing notes.  Pertinent labs & imaging results that were available during my care of the patient were reviewed by me and considered in my medical decision making (see chart for details).     Chitara A Marcelline [name] is a 52 y.o. female with a past medical history significant for recurrent right-sided Bell's palsy, hypertension, COPD, diabetes, tobacco abuse, and chronic neuropathies who presents with headache, dizziness, lightheadedness, and numbness.  Patient was made a code stroke in triage.  Patient reports that she was last normal at midnight going to bed last night however on further discussion reports that she was already having headaches and dizziness yesterday.  She reports no new vision changes but she does say that her left face feels more numb than normal.  She reports that when she stands up she feels dizzy and unbalanced.  She reports that she was feeling lightheaded when laying down.  She denies any new numbness, tingling, or weakness of her extremities but has chronic leg tingling and numbness.  She denies recent fevers, chills, neck pain, neck stiffness, cough, congestion above her baseline.  She denies any urinary symptoms or GI symptoms.  Next  Patient had a CT code stroke which did not show acute hemorrhage.  Neurology felt this is likely, gated migraine in the setting of her headaches.  They are recommending MRI as well as giving her a headache cocktail.  Next  Patient will have screening laboratory testing and monitoring while her work-up is initiated.  If MRI is negative and her symptoms improved with medicines, anticipate discharge home.  1:24 PM On reassessment, patient reports her numbness is improved.  She no longer has any dizziness.  She reports her  headache is also improved after medications.  Patient does not want to stay for MRI and would rather be discharged home.  She agrees this is likely a complicated headache causing neurologic symptoms.  Patient advised that until MRI, we cannot definitively say she did not have a stroke however patient agrees with this.  Patient will follow-up with her PCP and will be given instructions for neurology follow-up.  Patient understood return precautions as well as follow-up plan.  Patient no other questions or concerns and was discharged in good condition.   Final Clinical Impressions(s) / ED Diagnoses   Final diagnoses:  Numbness  Dizziness  Acute nonintractable headache, unspecified headache type    ED Discharge Orders    None      Clinical Impression: 1. Numbness   2. Dizziness   3. Acute nonintractable headache, unspecified headache type     Disposition: Discharge  Condition: Good  I have discussed the results, Dx and Tx plan with the pt(& family if present). He/she/they expressed understanding and agree(s) with the plan. Discharge instructions discussed at great length. Strict return precautions discussed and pt &/or family have verbalized understanding of the instructions. No further questions at time of discharge.    New Prescriptions   No medications on file    Follow Up: Mellody Dance, [place] [address] [place] [address]  [address] [place] [address] [phone]       Tegeler, Gwenyth Allegra, MD 11/17/17 1329

## 2017-11-17 NOTE — ED Notes (Addendum)
MD assessing pt- LSN midnight when she went to bed. Blurry vision starting yesterday and worsening. Headache started 2 days ago. Dizzy upon standing for few days.

## 2017-11-22 ENCOUNTER — Ambulatory Visit: Payer: Self-pay | Admitting: Cardiology

## 2017-11-26 ENCOUNTER — Institutional Professional Consult (permissible substitution): Payer: Self-pay | Admitting: Pulmonary Disease

## 2017-11-26 DIAGNOSIS — J449 Chronic obstructive pulmonary disease, unspecified: Secondary | ICD-10-CM | POA: Diagnosis not present

## 2017-11-27 ENCOUNTER — Ambulatory Visit: Payer: Self-pay | Admitting: Dietician

## 2017-11-27 ENCOUNTER — Ambulatory Visit (HOSPITAL_COMMUNITY)
Admission: RE | Admit: 2017-11-27 | Discharge: 2017-11-27 | Disposition: A | Discharge status: Home / Self Care | Payer: Medicare HMO | Source: Ambulatory Visit | Attending: Cardiology | Admitting: Cardiology

## 2017-11-27 ENCOUNTER — Encounter (HOSPITAL_COMMUNITY): Payer: Self-pay

## 2017-11-27 DIAGNOSIS — R079 Chest pain, unspecified: Secondary | ICD-10-CM | POA: Diagnosis not present

## 2017-11-27 MED ORDER — NITROGLYCERIN 0.4 MG SL SUBL
SUBLINGUAL_TABLET | SUBLINGUAL | Status: AC
  Filled 2017-11-27: qty 1

## 2017-11-27 MED ORDER — DILTIAZEM HCL 25 MG/5ML IV SOLN
5.0000 mg | Freq: Once | INTRAVENOUS | Status: AC
  Administered 2017-11-27: 5 mg via INTRAVENOUS
  Filled 2017-11-27: qty 5

## 2017-11-27 MED ORDER — DILTIAZEM HCL 25 MG/5ML IV SOLN
INTRAVENOUS | Status: AC
  Administered 2017-11-27: 5 mg via INTRAVENOUS
  Filled 2017-11-27: qty 5

## 2017-11-27 MED ORDER — METOPROLOL TARTRATE 5 MG/5ML IV SOLN
INTRAVENOUS | Status: AC
  Filled 2017-11-27: qty 20

## 2017-11-27 MED ORDER — SODIUM CHLORIDE 0.9 % IV BOLUS
500.0000 mL | Freq: Once | INTRAVENOUS | Status: AC
  Administered 2017-11-27: 500 mL via INTRAVENOUS

## 2017-11-27 MED ORDER — NITROGLYCERIN 0.4 MG SL SUBL: 0.8000 mg | SUBLINGUAL_TABLET | Freq: Once | SUBLINGUAL | Status: DC

## 2017-11-27 MED ORDER — METOPROLOL TARTRATE 5 MG/5ML IV SOLN
5.0000 mg | INTRAVENOUS | Status: DC | PRN
  Administered 2017-11-27 (×3): 5 mg via INTRAVENOUS

## 2017-11-27 NOTE — Progress Notes (Signed)
Patient given 15mg  of Lopressor IV without any change in HR per orders. Dr. Meda Coffee informed that heart rate is still in the 90s after Lopressor. New orders given for 5mg  of Cardizem and 500NS bolus. After Cardizem and Bolus, patient heart rate still in 80s, BP 97/60. Dr. Meda Coffee called and order given to reschedule patient. Patient updated on the reason why CT exam cant be performed today. Patient rescheduled CT appointment with Ivin Booty (scheduler). Dr. Meda Coffee to inform order cardiologist.

## 2017-11-28 ENCOUNTER — Encounter: Payer: Self-pay | Admitting: Pulmonary Disease

## 2017-11-28 ENCOUNTER — Ambulatory Visit (INDEPENDENT_AMBULATORY_CARE_PROVIDER_SITE_OTHER): Payer: Medicare HMO | Admitting: Pulmonary Disease

## 2017-11-28 ENCOUNTER — Other Ambulatory Visit: Payer: Self-pay

## 2017-11-28 DIAGNOSIS — F17200 Nicotine dependence, unspecified, uncomplicated: Secondary | ICD-10-CM | POA: Diagnosis not present

## 2017-11-28 DIAGNOSIS — Z9989 Dependence on other enabling machines and devices: Secondary | ICD-10-CM | POA: Diagnosis not present

## 2017-11-28 DIAGNOSIS — G4733 Obstructive sleep apnea (adult) (pediatric): Secondary | ICD-10-CM

## 2017-11-28 DIAGNOSIS — J449 Chronic obstructive pulmonary disease, unspecified: Secondary | ICD-10-CM | POA: Diagnosis not present

## 2017-11-28 DIAGNOSIS — I209 Angina pectoris, unspecified: Secondary | ICD-10-CM

## 2017-11-28 NOTE — Assessment & Plan Note (Signed)
severe sleep apnea. Referral ask [employer] to provide you CPAP supplies and checkout your machine to see if it can be fixed to maintain pressure of 10 cm.  Schedule CPAP titration study, based on this we will decide exact pressure and whether you actually need oxygen or not  Weight loss encouraged, compliance with goal of at least 4-6 hrs every night is the expectation. Advised against medications with sedative side effects Cautioned against driving when sleepy - understanding that sleepiness will vary on a day to day basis

## 2017-11-28 NOTE — Patient Instructions (Signed)
You have severe sleep apnea. Referral ask [facility] to provide you CPAP supplies and checkout your machine to see if it can be fixed to maintain pressure of 10 cm.  Schedule CPAP titration study, based on this we will decide exact pressure and whether you actually need oxygen or not  Try to use nicotine patch 21 mg daily and see if you can cut down cigarettes to less than a pack per day. Call us back in 1 month and we can send prescription for Nicotrol inhaler  Schedule spirometry pre-and post next visit

## 2017-11-28 NOTE — Assessment & Plan Note (Signed)
Schedule spirometry pre-and post next visit  Based on this, decide need for LABA/LMA. Meantime continue albuterol  Lung function was normal in 2013

## 2017-11-28 NOTE — Progress Notes (Signed)
Subjective:    Patient ID: [name], female    DOB: 1966-03-06, 52 y.o.   MRN: [id number]  HPI  Chief Complaint  Patient presents with  . Pulm/Sleep Consult    Referred by Dr. Raliegh Scarlet for COPD and OSA. Had been using a cpap machine until it broke last year. Used [employer] as her DME in the past. Uses O2 at night. Was diagnosed with Bell's Palsy 2 weeks ago.    52 year old obese smoker presents to establish care for sleep apnea and COPD. She was diagnosed with severe OSA and started on CPAP of 10 cm in 10/2013 when she weighed around 282 pounds.  She initially used a nasal mask but finally settled on full facemask.  This dramatically improved her daytime somnolence and fatigue.  [place] her CPAP broke in 2017, she is not sure whether this is something to do with the power cord.  She stopped using her machine for a while and then was able to obtain a friend's machine which was somewhat older and has been using this since 2018.  Lately she has not been feeling rested and wonders if this machine is set to the right pressure.  She had an episode of pneumonia in 2018, was admitted to [facility] and after discharge based on an overnight nocturnal oximetry was found to desaturate and was asked to use 4 L of oxygen blended into CPAP machine.  She wonders if she still needs this.  Sleepiness score is Bedtime is between 11 PM and midnight, sleep latency about 30 minutes, she prefers not to sleep on her back and then he sleeps on her side but has a sore right shoulder, reports 3-4 nocturnal awakenings of the nocturia and is out of bed at 8 AM feeling tired with congestion and occasional headaches or dryness of mouth. She lost about 15 pounds since the original study to her current weight of 257 pounds. There is no history suggestive of cataplexy, sleep paralysis or parasomnias  She smokes about a pack to pack and 1/2/day, more than 40 pack years.   Her husband also smokes.  I note normal lung function when she presented with a cough to our office in 2013. Chest x-ray 10/05/2017 did not show any infiltrates. She has had 2-3 ED visits this year for repeated cough and wheezing. Wellbutrin helped her decrease smoking in the past but caused hives within 2 weeks.  Chantix is also helped in the past. She has albuterol MDI and nebulizer that she uses on an as-needed basis   She was diagnosed with Bell's palsy 2 weeks ago, otherwise she has diabetes and is just starting on Victoza   Significant tests/ events reviewed  PFT's 03/18/2012 > spirometry nml  NPSG 09/2013 >> AHI 73/hour 10/2013 CPAP titration 10 cm - 282 pounds  Past Medical History:  Diagnosis Date  . Anemia   . Anxiety   . Asthmatic bronchitis    normal PFT/ seen by pulmonary no evidence of COPD  . Chronic bronchitis ([place])   . Chronic lower back pain   . Chronic respiratory failure with hypoxia (HCC)    On 2-3 L of oxygen at home  . COPD (chronic obstructive pulmonary disease) ([place])   . Daily headache   . Depression   . Diabetic peripheral neuropathy ([place])   . Gastric erosions    EGD 08/2010.  [name] GERD (gastroesophageal reflux disease)   . Heavy menses   .  High cholesterol   . History of blood transfusion    "related to low HgB" (10/05/2017)  . History of hiatal hernia   . HTN (hypertension)   . Increased urinary protein excretion   . Internal hemorrhoids    Colonoscopy 5/12.  . On home oxygen therapy    "5L at night" (10/05/2017)  . OSA on CPAP   . Osteoarthritis    "back" (10/05/2017)  . Pneumonia    "lots of times" (10/05/2017)  . Shortness of breath   . Tachycardia    never had test done since no insurance  . Type II diabetes mellitus ([place])     Past Surgical History:  Procedure Laterality Date  . CESAREAN SECTION  1988; 1989  . CHOLECYSTECTOMY OPEN  1990  . COLONOSCOPY  09/16/2010   RCV:ELFYBO colon/small internal hemorrhoids  .  ESOPHAGOGASTRODUODENOSCOPY  09/16/2010   SLF: normal/mild gastritis  . ESOPHAGOGASTRODUODENOSCOPY N/A 10/19/2014   Procedure: ESOPHAGOGASTRODUODENOSCOPY (EGD);  Surgeon: Danie Binder, MD;  Location: [facility] SUITE;  Service: Endoscopy;  Laterality: N/A;  830  . FRACTURE SURGERY    . HYSTEROSCOPY WITH THERMACHOICE  01/17/2012   Procedure: HYSTEROSCOPY WITH THERMACHOICE;  Surgeon: Florian Buff, MD;  Location: [facility] ORS;  Service: Gynecology;  Laterality: N/A;  total therapy time: 9:13sec  D5W  18 ml in, D5W   30ml out, temperture 87degrees celcious  . KIDNEY SURGERY     as child for blockages  . TONSILLECTOMY    . TUBAL LIGATION  1989  . TYMPANOSTOMY TUBE PLACEMENT Bilateral    "several times when I was a child"  . uterine ablation    . WRIST FRACTURE SURGERY Left 1995    Allergies  Allergen Reactions  . Codeine Itching and Nausea Only  . Metformin And Related Diarrhea and Other (See Comments)    Stomach pain/nausea  . Wellbutrin [Bupropion Hcl] Hives  . Acyclovir And Related Rash    Social History   Socioeconomic History  . Marital status: Married    Spouse name: Not on file  . Number of children: 2  . Years of education: Not on file  . Highest education level: Not on file  Occupational History  . Occupation: unemployed  Social Needs  . Financial resource strain: Not on file  . Food insecurity:    Worry: Not on file    Inability: Not on file  . Transportation needs:    Medical: Not on file    Non-medical: Not on file  Tobacco Use  . Smoking status: Current Every Day Smoker    Packs/day: 1.50    Years: 39.00    Pack years: 58.50    Types: Cigarettes  . Smokeless tobacco: Never Used  Substance and Sexual Activity  . Alcohol use: Yes    Comment: 10/05/2017 "5 - 8 shots tequilia//month"  . Drug use: Yes    Types: Marijuana    Comment: 10/05/2017 "a few times/wk"  . Sexual activity: Yes    Birth control/protection: Surgical  Lifestyle  . Physical activity:    Days per  week: Not on file    Minutes per session: Not on file  . Stress: Not on file  Relationships  . Social connections:    Talks on phone: Not on file    Gets together: Not on file    Attends religious service: Not on file    Active member of club or organization: Not on file    Attends meetings of clubs or organizations: Not  on file    Relationship status: Not on file  . Intimate partner violence:    Fear of current or ex partner: Not on file    Emotionally abused: Not on file    Physically abused: Not on file    Forced sexual activity: Not on file  Other Topics Concern  . Not on file  Social History Narrative  . Not on file      Family History  Problem Relation Age of Onset  . Heart attack Father 60       deceased, etoh use  . Heart disease Father   . Alcohol abuse Father   . Depression Father   . Heart attack Mother 67       deceased  . Diabetes Mother   . Breast cancer Mother   . Heart failure Mother        oxygen dependence, nonsmoker  . Heart disease Mother   . Depression Mother   . Cancer Mother   . Liver disease Maternal Aunt 40       died while on liver transplant list  . Heart attack Maternal Grandmother        premature CAD  . Ulcers Sister   . Hypertension Sister   . Colon cancer Neg Hx       Review of Systems  Constitutional: Negative for fever and unexpected weight change.  HENT: Negative for congestion, dental problem, ear pain, nosebleeds, postnasal drip, rhinorrhea, sinus pressure, sneezing, sore throat and trouble swallowing.   Eyes: Negative for redness and itching.  Respiratory: Negative for cough, chest tightness, shortness of breath and wheezing.   Cardiovascular: Negative for palpitations and leg swelling.  Gastrointestinal: Negative for nausea and vomiting.  Genitourinary: Negative for dysuria.  Musculoskeletal: Negative for joint swelling.  Skin: Negative for rash.  Allergic/Immunologic: Negative.  Negative for environmental allergies,  food allergies and immunocompromised state.  Neurological: Negative for headaches.  Hematological: Does not bruise/bleed easily.  Psychiatric/Behavioral: Negative for dysphoric mood. The patient is not nervous/anxious.        Objective:   Physical Exam  Gen. Pleasant, obese, in no distress, normal affect ENT - no lesions, no post nasal drip, class 2-3 airway Neck: No JVD, no thyromegaly, no carotid bruits Lungs: no use of accessory muscles, no dullness to percussion, decreased without rales or rhonchi  Cardiovascular: Rhythm regular, heart sounds  normal, no murmurs or gallops, no peripheral edema Abdomen: soft and non-tender, no hepatosplenomegaly, BS normal. Musculoskeletal: No deformities, no cyanosis or clubbing Neuro:  alert, Rt LMN facial palsy, no tremors       Assessment & Plan:

## 2017-11-28 NOTE — Assessment & Plan Note (Signed)
Try to use nicotine patch 21 mg daily and see if you can cut down cigarettes to less than a pack per day. Call us back in 1 month and we can send prescription for Nicotrol inhaler

## 2017-11-30 ENCOUNTER — Other Ambulatory Visit: Payer: Self-pay | Admitting: Nurse Practitioner

## 2017-11-30 ENCOUNTER — Other Ambulatory Visit: Payer: Self-pay | Admitting: Family Medicine

## 2017-11-30 DIAGNOSIS — E1129 Type 2 diabetes mellitus with other diabetic kidney complication: Secondary | ICD-10-CM

## 2017-11-30 DIAGNOSIS — B3731 Acute candidiasis of vulva and vagina: Secondary | ICD-10-CM

## 2017-11-30 DIAGNOSIS — R809 Proteinuria, unspecified: Principal | ICD-10-CM

## 2017-11-30 DIAGNOSIS — B373 Candidiasis of vulva and vagina: Secondary | ICD-10-CM

## 2017-11-30 MED ORDER — METOPROLOL TARTRATE 50 MG PO TABS: 50.0000 mg | ORAL_TABLET | Freq: Once | ORAL | 0 refills | Status: DC

## 2017-12-03 ENCOUNTER — Other Ambulatory Visit: Payer: Self-pay | Admitting: Family Medicine

## 2017-12-03 ENCOUNTER — Encounter: Payer: Self-pay | Admitting: Family Medicine

## 2017-12-03 ENCOUNTER — Other Ambulatory Visit: Payer: Self-pay

## 2017-12-03 ENCOUNTER — Other Ambulatory Visit: Payer: Self-pay | Admitting: Internal Medicine

## 2017-12-03 DIAGNOSIS — E782 Mixed hyperlipidemia: Principal | ICD-10-CM

## 2017-12-03 DIAGNOSIS — E1169 Type 2 diabetes mellitus with other specified complication: Secondary | ICD-10-CM

## 2017-12-03 DIAGNOSIS — B373 Candidiasis of vulva and vagina: Secondary | ICD-10-CM

## 2017-12-03 DIAGNOSIS — B3731 Acute candidiasis of vulva and vagina: Secondary | ICD-10-CM

## 2017-12-03 DIAGNOSIS — E1129 Type 2 diabetes mellitus with other diabetic kidney complication: Secondary | ICD-10-CM

## 2017-12-03 DIAGNOSIS — R809 Proteinuria, unspecified: Principal | ICD-10-CM

## 2017-12-03 MED ORDER — ATORVASTATIN CALCIUM 10 MG PO TABS: 10.0000 mg | ORAL_TABLET | Freq: Every day | ORAL | 0 refills | Status: DC

## 2017-12-03 NOTE — Progress Notes (Signed)
Refill. MPulliam, CMA/RT(R)

## 2017-12-04 ENCOUNTER — Ambulatory Visit (HOSPITAL_COMMUNITY)
Admission: RE | Admit: 2017-12-04 | Discharge: 2017-12-04 | Disposition: A | Discharge status: Home / Self Care | Payer: Medicare HMO | Source: Ambulatory Visit | Attending: Cardiology | Admitting: Cardiology

## 2017-12-04 ENCOUNTER — Encounter (HOSPITAL_COMMUNITY): Payer: Self-pay

## 2017-12-04 ENCOUNTER — Ambulatory Visit (HOSPITAL_COMMUNITY): Payer: Medicare Other

## 2017-12-04 DIAGNOSIS — I25119 Atherosclerotic heart disease of native coronary artery with unspecified angina pectoris: Secondary | ICD-10-CM | POA: Diagnosis not present

## 2017-12-04 DIAGNOSIS — I209 Angina pectoris, unspecified: Secondary | ICD-10-CM

## 2017-12-04 MED ORDER — IOPAMIDOL (ISOVUE-370) INJECTION 76%
100.0000 mL | Freq: Once | INTRAVENOUS | Status: AC | PRN
  Administered 2017-12-04: 100 mL via INTRAVENOUS

## 2017-12-04 MED ORDER — SODIUM CHLORIDE 0.9 % IV BOLUS
500.0000 mL | Freq: Once | INTRAVENOUS | Status: AC
  Administered 2017-12-04: 500 mL via INTRAVENOUS

## 2017-12-04 MED ORDER — DILTIAZEM HCL 25 MG/5ML IV SOLN
10.0000 mg | Freq: Once | INTRAVENOUS | Status: AC
  Administered 2017-12-04: 10 mg via INTRAVENOUS
  Filled 2017-12-04: qty 5

## 2017-12-04 MED ORDER — NITROGLYCERIN 0.4 MG SL SUBL
SUBLINGUAL_TABLET | SUBLINGUAL | Status: AC
  Filled 2017-12-04: qty 1

## 2017-12-04 MED ORDER — DILTIAZEM HCL 25 MG/5ML IV SOLN
INTRAVENOUS | Status: AC
  Filled 2017-12-04: qty 5

## 2017-12-04 MED ORDER — NITROGLYCERIN 0.4 MG SL SUBL
0.4000 mg | SUBLINGUAL_TABLET | Freq: Once | SUBLINGUAL | Status: AC
  Administered 2017-12-04: 0.4 mg via SUBLINGUAL
  Filled 2017-12-04: qty 25

## 2017-12-07 ENCOUNTER — Ambulatory Visit (INDEPENDENT_AMBULATORY_CARE_PROVIDER_SITE_OTHER): Payer: Medicare HMO | Admitting: Family Medicine

## 2017-12-07 ENCOUNTER — Encounter: Payer: Self-pay | Admitting: Family Medicine

## 2017-12-07 ENCOUNTER — Telehealth: Payer: Self-pay

## 2017-12-07 VITALS — BP 133/85 | HR 98 | Wt 252.3 lb

## 2017-12-07 DIAGNOSIS — H6980 Other specified disorders of Eustachian tube, unspecified ear: Secondary | ICD-10-CM | POA: Diagnosis not present

## 2017-12-07 DIAGNOSIS — H8110 Benign paroxysmal vertigo, unspecified ear: Secondary | ICD-10-CM

## 2017-12-07 DIAGNOSIS — Z716 Tobacco abuse counseling: Secondary | ICD-10-CM | POA: Diagnosis not present

## 2017-12-07 DIAGNOSIS — H6691 Otitis media, unspecified, right ear: Secondary | ICD-10-CM

## 2017-12-07 DIAGNOSIS — Z720 Tobacco use: Secondary | ICD-10-CM

## 2017-12-07 DIAGNOSIS — J019 Acute sinusitis, unspecified: Secondary | ICD-10-CM | POA: Diagnosis not present

## 2017-12-07 DIAGNOSIS — E785 Hyperlipidemia, unspecified: Secondary | ICD-10-CM

## 2017-12-07 MED ORDER — AMOXICILLIN 500 MG PO CAPS: 1000.0000 mg | ORAL_CAPSULE | Freq: Three times a day (TID) | ORAL | 0 refills | Status: DC

## 2017-12-07 MED ORDER — ASPIRIN EC 81 MG PO TBEC: 81.0000 mg | DELAYED_RELEASE_TABLET | Freq: Every day | ORAL | 3 refills | Status: DC

## 2017-12-07 MED ORDER — MECLIZINE HCL 25 MG PO TABS: 25.0000 mg | ORAL_TABLET | Freq: Three times a day (TID) | ORAL | 0 refills | Status: DC | PRN

## 2017-12-07 MED ORDER — ATORVASTATIN CALCIUM 80 MG PO TABS: 80.0000 mg | ORAL_TABLET | Freq: Every day | ORAL | 3 refills | Status: DC

## 2017-12-07 MED ORDER — ISOSORBIDE MONONITRATE ER 30 MG PO TB24: 30.0000 mg | ORAL_TABLET | Freq: Every day | ORAL | 3 refills | Status: DC

## 2017-12-07 MED ORDER — FLUTICASONE PROPIONATE 50 MCG/ACT NA SUSP: NASAL | 2 refills | Status: DC

## 2017-12-07 NOTE — Progress Notes (Signed)
Pt here for an acute care OV today   Impression and Recommendations:    1. Right otitis media, unspecified otitis media type   2. Acute non-recurrent sinusitis, unspecified location   3. Dysfunction of Eustachian tube, unspecified laterality   4. Benign paroxysmal positional vertigo, unspecified laterality   5. Tobacco abuse   6. Tobacco abuse counseling   7. Obesity, Class III, BMI 40-49.9 (morbid obesity) (HCC)     Sinusitis -Prescribed antibiotics; see med list below -Recommended saline sinus rinses followed by flonase spray each morning and evening -Covered red flag symptoms for stroke and encouraged patient to call 911 if onset occurs -Discussed non-medicinal care during infection including increasing water intake, rest and eating well  Dysfunction of Eustachian tube -Prescribed medication for dizziness; see med list below -Discussed importance of fall prevention during onset of dizziness and standing -Discussed anatomy and symptoms of eustachian tube dysfunction; recommended exercises to improve balance and dizziness  Tobacco Abuse -Smoking cessation instruction/counseling given -Encouraged patient to consider nicotine patches and discussed possible negative impact of tobacco use on health   Meds ordered this encounter  Medications  . meclizine (ANTIVERT) 25 MG tablet    Sig: Take 1 tablet (25 mg total) by mouth 3 (three) times daily as needed for dizziness or nausea.    Dispense:  30 tablet    Refill:  0  . amoxicillin (AMOXIL) 500 MG capsule    Sig: Take 2 capsules (1,000 mg total) by mouth 3 (three) times daily.    Dispense:  60 capsule    Refill:  0  . fluticasone (FLONASE) 50 MCG/ACT nasal spray    Sig: 1 spray each nostril after sinus rinses twice daily    Dispense:  16 g    Refill:  2    Medications Discontinued During This Encounter  Medication Reason  . fluconazole (DIFLUCAN) 200 MG tablet Completed Course  . metoprolol tartrate (LOPRESSOR) 50 MG  tablet Completed Course     No orders of the defined types were placed in this encounter.    Education and routine counseling performed. Handouts provided  Gross side effects, risk and benefits, and alternatives of medications and treatment plan in general discussed with patient.  Patient is aware that all medications have potential side effects and we are unable to predict every side effect or drug-drug interaction that may occur.   Patient will call with any questions prior to using medication if they have concerns.  Expresses verbal understanding and consents to current therapy and treatment regimen.  No barriers to understanding were identified.  Red flag symptoms and signs discussed in detail.  Patient expressed understanding regarding what to do in case of emergency\urgent symptoms   Please see AVS handed out to patient at the end of our visit for further patient instructions/ counseling done pertaining to today's office visit.   Return if symptoms worsen or fail to improve, for Please keep your chronic follow-up office visits..     Note:  This document was prepared occasionally using Dragon voice recognition software and may include unintentional dictation errors in addition to a scribe.   This document serves as a record of services personally performed by Mellody Dance, MD. It was created on her behalf by Georga Bora, a trained medical scribe. The creation of this record is based on the scribe's personal observations and the provider's statements to them.   I have reviewed the above medical documentation for accuracy and completeness and I concur.  Mellody Dance 12/07/17 5:11 PM   --------------------------------------------------------------------------------------------------------------------------------------------------------------------------------------------------------------------------------------------    Subjective:    CC:  Chief Complaint  Patient  presents with  . Dizziness    HPI: [name] is a 52 y.o. female who presents to [facility] today for issues as discussed below.  -Patient complains of dizziness, blurry vision, headaches, fatigue, chills, onset two days ago -States dizziness is "like the room is spinning," and that vision usually resolves after dizziness resolves. States she has been experiencing nausea with dizziness that resolves with dizziness as well -Pt exhibited signs of dizziness when moving from chair to examination table -Pt denies onset of neurological symptoms including numbness or tingling  -States "a couple weeks ago" she went to the ER and was diagnosed with Bell's Palsy -Says around that time she had and ear pain that resolved with some drainage -Says she does have a perforated right TM    No problems updated.   Wt Readings from Last 3 Encounters:  01/10/18 251 lb 1.6 oz (113.9 kg)  12/07/17 252 lb 4.8 oz (114.4 kg)  11/28/17 257 lb 6.4 oz (116.8 kg)   BP Readings from Last 3 Encounters:  01/10/18 127/77  12/07/17 133/85  12/04/17 (!) 103/51   BMI Readings from Last 3 Encounters:  01/10/18 43.10 kg/m  12/07/17 43.31 kg/m  11/28/17 44.18 kg/m     Patient Care Team    Relationship Specialty Notifications Start End  Mellody Dance, DO PCP - General Family Medicine  10/05/17   Belva Crome, MD PCP - Cardiology Cardiology Admissions 10/05/17   Danie Binder, MD  Gastroenterology  09/08/10      Patient Active Problem List   Diagnosis Date Noted  . Poorly controlled diabetes mellitus ([place]) 11/12/2017    Priority: High  . Mixed diabetic hyperlipidemia associated with type 2 diabetes mellitus ([place]) 10/01/2017    Priority: High  . Tobacco use disorder-current smoker greater than 40-pack-year history- since age 74 1 ppd 10/01/2017    Priority: High  . Diabetic peripheral neuropathy ([place]) 10/01/2017    Priority: High  . Hyperlipidemia  associated with type 2 diabetes mellitus ([place]) 10/18/2015    Priority: High  . Hypertension associated with diabetes ([place]) 12/02/2010    Priority: High  . DM (diabetes mellitus) type II controlled, neurological manifestation ([place]) 12/02/2010    Priority: High  . Positive for macroalbuminuria 11/12/2017    Priority: Medium  . COPD with chronic bronchitis ([place]) 10/01/2017    Priority: Medium  . Major depressive disorder, recurrent, severe without psychotic features ([place])     Priority: Medium  . Vitamin D deficiency 07/06/2011    Priority: Medium  . Morbid obesity ([place]) 05/06/2011    Priority: Medium  . H/O drug abuse 11/12/2017    Priority: Low  . h/o freq Vaginal yeast infections 11/12/2017    Priority: Low  . Adult attention deficit hyperactivity disorder 01/14/2016    Priority: Low  . Gastric reflux 10/18/2015    Priority: Low  . Asthmatic bronchitis 06/11/2011    Priority: Low  . Atypical chest pain 10/05/2017  . Tobacco abuse counseling 10/01/2017  . Iron deficiency anemia 10/01/2017  . Dyspepsia 09/23/2014  . MDD (major depressive disorder), recurrent episode, severe ([place]) 09/13/2014  . Nasal congestion 05/03/2014  . Chest pain on respiration 03/25/2014  . Leukocytosis 03/25/2014  . Upper airway cough syndrome 02/28/2014  . Abnormal drug screen 04/11/2013  . OSA on CPAP 04/11/2013  . Perforated  ear drum 05/22/2012  . Bell's palsy 01/23/2012  . Carpal tunnel syndrome 01/23/2012  . Carpal tunnel syndrome on both sides 01/11/2012  . Pulmonary infiltrates 12/22/2011  . Chronic respiratory failure ([place]) 10/16/2011  . Oxygen dependent 10/16/2011  . Peripheral neuropathy 07/07/2011  . Back pain 06/11/2011  . Depression 05/04/2011  . Gastric erosions 12/05/2010  . Cigarette smoker 12/02/2010  . COPD with acute exacerbation ([place]) 11/30/2010  . Microcytic anemia 09/08/2010  . GERD (gastroesophageal reflux disease) 09/08/2010  . Esophageal dysphagia 09/08/2010    Past  Medical history, Surgical history, Family history, Social history, Allergies and Medications have been entered into the medical record, reviewed and changed as needed.    Current Meds  Medication Sig  . acetaminophen (TYLENOL) 500 MG tablet Take 1,000 mg by mouth every 6 (six) hours as needed for mild pain or headache.  . ALPRAZolam (XANAX) 0.5 MG tablet Take 1 tablet (0.5 mg total) by mouth daily as needed for anxiety.  [name] aspirin EC 81 MG tablet Take 1 tablet (81 mg total) by mouth daily.  [name] atorvastatin (LIPITOR) 80 MG tablet Take 1 tablet (80 mg total) by mouth daily.  . INVOKANA 300 MG TABS tablet TAKE 1 TABLET BY MOUTH DAILY BEFORE BREAKFAST.  [name] isosorbide mononitrate (IMDUR) 30 MG 24 hr tablet Take 1 tablet (30 mg total) by mouth daily.  [name] liraglutide (VICTOZA) 18 MG/3ML SOPN Inject 1.2 mg into the skin daily.  [name] losartan-hydrochlorothiazide (HYZAAR) 100-12.5 MG tablet TAKE 1 TABLET BY MOUTH DAILY. (Patient not taking: Reported on 01/10/2018)  . pantoprazole (PROTONIX) 40 MG tablet Take 1 tablet (40 mg total) by mouth daily. 30 minutes before a meal.  . pregabalin (LYRICA) 75 MG capsule Take 1 capsule (75 mg total) by mouth 3 (three) times daily.  . Vitamin D, Ergocalciferol, (DRISDOL) 50000 units CAPS capsule Take 1 capsule (50,000 Units total) by mouth every 7 (seven) days.  . [DISCONTINUED] amphetamine-dextroamphetamine (ADDERALL) 20 MG tablet Take 1 tablet (20 mg total) by mouth 2 (two) times daily.  . [DISCONTINUED] DULoxetine (CYMBALTA) 60 MG capsule Take 1 capsule (60 mg total) by mouth 2 (two) times daily. (Patient taking differently: Take 60 mg by mouth at bedtime. )   Current Facility-Administered Medications for the 12/07/17 encounter (Office Visit) with Mellody Dance, DO  Medication  . ipratropium-albuterol (DUONEB) 0.5-2.5 (3) MG/3ML nebulizer solution 3 mL    Allergies:  Allergies  Allergen Reactions  . Codeine Itching and Nausea Only  . Metformin And Related Diarrhea  and Other (See Comments)    Stomach pain/nausea  . Wellbutrin [Bupropion Hcl] Hives  . Acyclovir And Related Rash     Review of Systems: General:   Denies fever, chills, unexplained weight loss.  Optho/Auditory:   Denies visual changes, blurred vision/LOV Respiratory:   Denies wheeze, DOE more than baseline levels.   Cardiovascular:   Denies chest pain, palpitations, new onset peripheral edema  Gastrointestinal:   Denies nausea, vomiting, diarrhea, abd pain.  Genitourinary: Denies dysuria, freq/ urgency, flank pain or discharge from genitals.  Endocrine:     Denies hot or cold intolerance, polyuria, polydipsia. Musculoskeletal:   Denies unexplained myalgias, joint swelling, unexplained arthralgias, gait problems.  Skin:  Denies new onset rash, suspicious lesions Neurological:     Denies dizziness, unexplained weakness, numbness  Psychiatric/Behavioral:   Denies mood changes, suicidal or homicidal ideations, hallucinations    Objective:   Blood pressure 133/85, pulse 98, weight 252 lb 4.8 oz (114.4 kg), SpO2 98 %. Body  mass index is 43.31 kg/m. General:  Well Developed, well nourished, appropriate for stated age.  Neuro:  Alert and oriented,  extra-ocular muscles intact  HEENT:  Normocephalic, atraumatic, neck supple -Left TM has fibrous tissue and scarring over half TM; mild bulge in non-scarring part -Right TM perforated, erythmatous -TTP on right frontal sinus, right maxillary sinus -Anterior cervical and periauricular tender LAD -Nares are edematous, erythematous bilaterally, right nares more copious than left -Slight erythema, post oropharynx Skin:  no gross rash, warm, pink. Cardiac:  RRR, S1 S2 Respiratory:  ECTA B/L and A/P, Not using accessory muscles, speaking in full sentences- unlabored. Vascular:  Ext warm, no cyanosis apprec.; cap RF less 2 sec. Psych:  No HI/SI, judgement and insight good, Euthymic mood. Full Affect.

## 2017-12-07 NOTE — Telephone Encounter (Signed)
-----   Message from Charlie Pitter, [place] sent at 12/06/2017  1:57 PM EDT ----- I am covering Laura's box today. Dr. Tamala Julian has reviewed study. Please call patient and let them know study was technically challenging given her elevated heart rate but does indicate calcium buildup in her heart arteries, primarily in the artery in the back of her heart. Given this is in distal (downstream) vessels, Dr. Tamala Julian suggests making sure her medication regimen is optimized to reduce cardiovascular risk as she has several risk factors likely contributing to these blockages. This includes the following:  - starting baby aspirin 81mg  daily  - increasing atorvastatin to 80mg  QPM with f/u liver/lipid profile in 6 weeks (last cholesterol was way too high) - maintain good blood pressure control (seems to be controlled by recent check) - f/u PCP for strict blood sugar control, goal A1C less than 7  - if still having intermittent CP, can try Imdur 30mg  daily to start - Adderall can worsen ischemic heart disease and increase cardiac risk so she should discuss safer alternatives with whoever prescribes this medicine - keep f/u scheduled 12/25/17 - will cc back to Dr. Tamala Julian so he is aware what pt is being advised.   Dayna Dunn PA-C

## 2017-12-07 NOTE — Telephone Encounter (Signed)
Spoke with pt. We discussed and reviewed recommendations. Pt understands she needs to increase her statin, start a baby asa, continue working on her A1C. She has opted to start Imdur 30mg  qd for her intermittent CP. Pt states she will keep her follow up with Cecilie Kicks on 9/5. She will come fasting for labs at that time. Pt also had some c/o dizziness this morning. She denied low BP. I advised her to follow up with her PCP regarding this.

## 2017-12-07 NOTE — Patient Instructions (Addendum)
Also, sterile saline nasal rinses, such as Milta Deiters med or AYR sinus rinses, can be very helpful and should be done twice daily- especially throughout the allergy season.   Remember you should use distilled water or previously boiled water to do this.  Then you may use over-the-counter Flonase 1 spray each nostril twice daily after sinus rinses.   If your eyes tend to get an itchy or irritated feeling, you can use Naphcon-A over-the-counter eyedrops as needed  -Please finish all of your antibiotics even if you are feeling better within a couple days.  Please use Tylenol as needed for headache associated with your sinus pressure and sinusitis infection   How to Treat Vertigo at Home with Exercises  What is Vertigo?  Vertigo is a relatively common symptom most often associated with conditions such as sinusitis (inflammation of your sinuses due to viruses, allergies, or bacterial infections), or an inner ear infection or ear trauma.   It can be brought on by trauma (e.g. a blow to the head or whiplash) or more serious things like minor strokes.   Symptoms can also be brought on by normal degenerative changes to your inner ear that occur with aging.  The condition tends to be more commonly seen in the elderly but it can occur in all ages.    Patients most often complain of dizziness, as if the room is spinning around them.   Symptoms are provoked by quick head movements or changes in position like going from standing to lying in bed, or even turning over in bed.   It may present with nausea and/or vomiting, and can be very debilitating to some folks.    By far the most common cause, known as Benign Paroxysmal Positional Vertigo (BPPV), is categorized by a sudden onset of symptoms, that are intense but short-lived (60 seconds or less), which is triggered by a change in head position.   Symptoms usually dissipate if you stay in one position and do not move your head.   Within the inner ear are collections of  calcium carbonate crystals referred to as otoliths which may become dislodged from their normal position and migrate into the semicircular canals of the inner ear, throwing off your body's ability to sense where you are in space.     Fig. 921 Anatomy of the Right Osseous Labyrinth. Antonieta Iba. Anatomy of the Human Body. 1918.            What Else Could Be Behind My Vertigo?  Some other causes of vertigo include:  Menieres disease (disorder of inner ear with ringing in ears, feeling of fullness/pressure within ear, and fluctuating hearing loss) Tumours Neurological disorders e.g. Multiple Sclerosis Motion Sickness (lack of coordination between visual stimuli, inner ear balance and positional sense) Migraine Labyrinthitis (inflammation of the fluid-filled tubes and sacs within the inner ear; may also be associated with changes in hearing) Vestibular neuritis (inflammation of the nerves associated with transmission of sensory info from the inner ear; usually of viral origins)  How it can be treated/cured? While certain medications have been prescribed for vertigo including Lorazepam your doing well 7 house the house going organizing and getting things ready for sale with the and Meclizine (for motion sickness), there exists no evidence to support a recommendation of any medication in the routine treatment of BPPV.  Clinical trials have demonstrated that repositioning techniques (listed below) are a superior option for management Otis Dials et al., 2008).    Figure above:  (A) Instructions  for the modified Epley procedure (MEP) for left ear posterior canal benign paroxysmal positional vertigo (PC-BPPV). For right ear BPPV, the procedure has to be performed in the opposite direction, starting with the head turned to the right side.  1. Start by sitting on a bed with your head turned 45 to the left. Place a pillow behind you so that on lying back it will be under your shoulders.  2. Lie back  quickly with shoulders on the pillow, neck extended, and head resting on the bed. In this position, the affected (left) ear is underneath. Wait for 30 secondS.  3. Turn your head 90 to the right (without raising it), and wait again for 30 seconds.  4. Turn your body and head another 90 to the right, and wait for another 30 seconds.  5. Sit up on the right side. This maneuver should be performed three times a day. Repeat this daily until you are free from positional vertigo for 24 hours.   (B) Instructions for the modified Semont maneuver (MSM) for left ear PC-BPPV. For right ear BPPV, the maneuver has to be performed in the opposite direction, starting with the head turned toward the left ear.  1. Sit upright on a bed with your head turned 45 toward the right ear.  2. Drop quickly to the left side, so that your head touches the bed behind your left ear. Wait 30 seconds.  3. Move head and trunk in a swift movement toward the other side without stopping in the upright position, so that your head comes to rest on the right side of your forehead. Wait again for 30 seconds.  4. Sit up again.  This maneuver should be performed three times a day. Repeat this daily until you are free from positional vertigo symptoms for 24 hours.   (   See the video in the supplementary material on the NeurologyWeb site; go to http://www.neurology.org/content/63/1/150/F1.expansion.html   )     You can also try this motion at home as well- Self-Treatment of Benign Paroxysmal Positional Vertigo Benign Paroxysmal Positioning Vertigo is caused by loose inner ear crystals in the inner ear that migrate while sleeping to the back-bottom inner ear balance canal, the so-called posterior semi-circular canal. The maneuver demonstrated below is the way to reposition the loose crystals so that the symptoms caused by the loose crystals go away. You may have a floating, swaying sense while walking or sitting for a few days after  this procedure.

## 2017-12-25 ENCOUNTER — Encounter (HOSPITAL_BASED_OUTPATIENT_CLINIC_OR_DEPARTMENT_OTHER): Payer: Self-pay

## 2017-12-26 ENCOUNTER — Ambulatory Visit: Payer: Self-pay | Admitting: Family Medicine

## 2017-12-26 NOTE — Progress Notes (Deleted)
Cardiology Office Note   Date:  12/26/2017   ID:  YAKIMA KREITZER, DOB 12/13/1965, MRN [id number]  PCP:  Mellody Dance, DO  Cardiologist:  Dr. Tamala Julian    No chief complaint on file.     History of Present Illness: [name] is a 52 y.o. female who presents for post hospitalization.  Pt with a hx of diabetes type 2, Hypertension, Hyperlipidemia, tobacco use, sleep apnea on CPAP, obesity, COPD, and gastritis   Hx of echo 09/2017 with EF 55-60% , mild LVH, G1DD  Cardiac CTA with RCA disease and FFR with focal severe stenosis in distal RCA, prior to the bifurcation to PDA and PLA.  Dr. Tamala Julian reviewed and ""It is imperative that she be on high intensity statin, aspirin, and Have good BP and glycemic control. She has moderate coronary disease that we should first attempt to control with medical therapy since there is no proximal vessel disease. "  Past Medical History:  Diagnosis Date  . Anemia   . Anxiety   . Asthmatic bronchitis    normal PFT/ seen by pulmonary no evidence of COPD  . Atypical chest pain 10/05/2017  . Chest pain on respiration 03/25/2014  . Chronic bronchitis ([place])   . Chronic lower back pain   . Chronic respiratory failure ([place]) 10/16/2011   Newly 02 dep 24/7 p discharge from [facility] 01/2013  - 03/13/2014  Walked RA  2 laps @ 185 ft each stopped due to  Sob/ aching in legs, thirsty/ no desat @ slow pace   . Chronic respiratory failure with hypoxia (H[facility])    On 2-3 L of oxygen at home  . Cigarette smoker 12/02/2010   Followed in Pulmonary clinic/ [facility]/ [place]   - Limits of effective care reviewed 12/22/2011    . COPD (chronic obstructive pulmonary disease) ([place])   . COPD with chronic bronchitis ([place]) 10/01/2017  . Daily headache   . Depression   . Diabetic peripheral neuropathy ([place])   . DM (diabetes mellitus) type II controlled, neurological manifestation ([place]) 12/02/2010  . Gastric erosions    EGD 08/2010.  [name] GERD  (gastroesophageal reflux disease)   . H/O drug abuse 11/12/2017   -- scanned document from outside source: [facility] in [place] which showed UDS positive for Adderall /amphetamine usage as well as positive urine for THC.  This test result was collected 08/11/2016 and reported 10/07/2016. - lso review of the chart shows another positive amphetamine, THC and METH in the urine back on 10/18/2015 under "care everywhere". ---So   . Heavy menses   . High cholesterol   . History of blood transfusion    "related to low HgB" (10/05/2017)  . History of hiatal hernia   . HTN (hypertension)   . Hyperlipidemia associated with type 2 diabetes mellitus ([place]) 10/18/2015  . Hypertension associated with diabetes ([place]) 12/02/2010   D/c acei 12/22/2011 due to psuedowheeze and narcotic dependent cough> ? Improved - 44/06/4740 started bystolic in place of cozar due to cough    . Increased urinary protein excretion   . Internal hemorrhoids    Colonoscopy 5/12.  . Iron deficiency anemia 10/01/2017  . Mixed diabetic hyperlipidemia associated with type 2 diabetes mellitus ([place]) 10/01/2017  . On home oxygen therapy    "5L at night" (10/05/2017)  . OSA on CPAP   . Osteoarthritis    "back" (10/05/2017)  . Oxygen dependent 10/16/2011  . Pneumonia    "lots  of times" (10/05/2017)  . Poorly controlled diabetes mellitus ([place]) 11/12/2017  . Pulmonary infiltrates 12/22/2011   Followed in Pulmonary clinic/ [facility]/ [place]    - See CT Chest 05/05/11   . Shortness of breath   . Tachycardia    never had test done since no insurance  . Tobacco use disorder-current smoker greater than 40-pack-year history- since age 76 1 ppd 10/01/2017  . Type II diabetes mellitus ([place])     Past Surgical History:  Procedure Laterality Date  . CESAREAN SECTION  1988; 1989  . CHOLECYSTECTOMY OPEN  1990  . COLONOSCOPY  09/16/2010   UEA:[id number] colon/small internal hemorrhoids  . ESOPHAGOGASTRODUODENOSCOPY   09/16/2010   SLF: normal/mild gastritis  . ESOPHAGOGASTRODUODENOSCOPY N/A 10/19/2014   Procedure: ESOPHAGOGASTRODUODENOSCOPY (EGD);  Surgeon: Danie Binder, MD;  Location: [facility]ITE;  Service: Endoscopy;  Laterality: N/A;  830  . FRACTURE SURGERY    . HYSTEROSCOPY WITH THERMACHOICE  01/17/2012   Procedure: HYSTEROSCOPY WITH THERMACHOICE;  Surgeon: Florian Buff, MD;  Location: [facility] ORS;  Service: Gynecology;  Laterality: N/A;  total therapy time: 9:13sec  D5W  18 ml in, D5W   64ml out, temperture 87degrees celcious  . KIDNEY SURGERY     as child for blockages  . TONSILLECTOMY    . TUBAL LIGATION  1989  . TYMPANOSTOMY TUBE PLACEMENT Bilateral    "several times when I was a child"  . uterine ablation    . WRIST FRACTURE SURGERY Left 1995     Current Outpatient Medications  Medication Sig Dispense Refill  . acetaminophen (TYLENOL) 500 MG tablet Take 1,000 mg by mouth every 6 (six) hours as needed for mild pain or headache.    . ALPRAZolam (XANAX) 0.5 MG tablet Take 1 tablet (0.5 mg total) by mouth daily as needed for anxiety. 30 tablet 2  . amoxicillin (AMOXIL) 500 MG capsule Take 2 capsules (1,000 mg total) by mouth 3 (three) times daily. 60 capsule 0  . amphetamine-dextroamphetamine (ADDERALL) 20 MG tablet Take 1 tablet (20 mg total) by mouth 2 (two) times daily. 60 tablet 0  . aspirin EC 81 MG tablet Take 1 tablet (81 mg total) by mouth daily. 90 tablet 3  . atorvastatin (LIPITOR) 80 MG tablet Take 1 tablet (80 mg total) by mouth daily. 90 tablet 3  . DULoxetine (CYMBALTA) 60 MG capsule Take 1 capsule (60 mg total) by mouth 2 (two) times daily. (Patient taking differently: Take 60 mg by mouth at bedtime. ) 60 capsule 2  . fluticasone (FLONASE) 50 MCG/ACT nasal spray 1 spray each nostril after sinus rinses twice daily 16 g 2  . INVOKANA 300 MG TABS tablet TAKE 1 TABLET BY MOUTH DAILY BEFORE BREAKFAST. 60 tablet 0  . isosorbide mononitrate (IMDUR) 30 MG 24 hr tablet Take 1 tablet (30 mg  total) by mouth daily. 90 tablet 3  . liraglutide (VICTOZA) 18 MG/3ML SOPN Inject 1.2 mg into the skin daily.    [name] losartan-hydrochlorothiazide (HYZAAR) 100-12.5 MG tablet TAKE 1 TABLET BY MOUTH DAILY. 60 tablet 0  . meclizine (ANTIVERT) 25 MG tablet Take 1 tablet (25 mg total) by mouth 3 (three) times daily as needed for dizziness or nausea. 30 tablet 0  . pantoprazole (PROTONIX) 40 MG tablet Take 1 tablet (40 mg total) by mouth daily. 30 minutes before a meal. 30 tablet 1  . pregabalin (LYRICA) 75 MG capsule Take 1 capsule (75 mg total) by mouth 3 (three) times daily. 90 capsule 5  .  Vitamin D, Ergocalciferol, (DRISDOL) 50000 units CAPS capsule Take 1 capsule (50,000 Units total) by mouth every 7 (seven) days. 12 capsule 10   No current facility-administered medications for this visit.     Allergies:   Codeine; Metformin and related; Wellbutrin [bupropion hcl]; and Acyclovir and related    Social History:  The patient  reports that she has been smoking cigarettes. She has a 58.50 pack-year smoking history. She has never used smokeless tobacco. She reports that she drinks alcohol. She reports that she has current or past drug history. Drug: Marijuana.   Family History:  The patient's ***family history includes Alcohol abuse in her father; Breast cancer in her mother; Cancer in her mother; Depression in her father and mother; Diabetes in her mother; Heart attack in her maternal grandmother; Heart attack (age of onset: 75) in her father; Heart attack (age of onset: 42) in her mother; Heart disease in her father and mother; Heart failure in her mother; Hypertension in her sister; Liver disease (age of onset: 78) in her maternal aunt; Ulcers in her sister.    ROS:  General:no colds or fevers, no weight changes Skin:no rashes or ulcers HEENT:no blurred vision, no congestion CV:see HPI PUL:see HPI GI:no diarrhea constipation or melena, no indigestion GU:no hematuria, no dysuria MS:no joint pain,  no claudication Neuro:no syncope, no lightheadedness Endo:no diabetes, no thyroid disease Wt Readings from Last 3 Encounters:  12/07/17 252 lb 4.8 oz (114.4 kg)  11/28/17 257 lb 6.4 oz (116.8 kg)  11/12/17 261 lb 4.8 oz (118.5 kg)     PHYSICAL EXAM: VS:  There were no vitals taken for this visit. , BMI There is no height or weight on file to calculate BMI. General:Pleasant affect, NAD Skin:Warm and dry, brisk capillary refill HEENT:normocephalic, sclera clear, mucus membranes moist Neck:supple, no JVD, no bruits  Heart:S1S2 RRR without murmur, gallup, rub or click Lungs:clear without rales, rhonchi, or wheezes QBH:ALPF, non tender, + BS, do not palpate liver spleen or masses Ext:no lower ext edema, 2+ pedal pulses, 2+ radial pulses Neuro:alert and oriented, MAE, follows commands, + facial symmetry    EKG:  EKG is ordered today. The ekg ordered today demonstrates ***   Recent Labs: 10/05/2017: TSH 1.961 11/17/2017: ALT 20; BUN 8; Creatinine, Ser 0.70; Hemoglobin 15.6; Platelets 272; Potassium 4.0; Sodium 137    Lipid Panel    Component Value Date/Time   CHOL 192 10/06/2017 0318   TRIG 177 (H) 10/06/2017 0318   HDL 25 (L) 10/06/2017 0318   CHOLHDL 7.7 10/06/2017 0318   VLDL 35 10/06/2017 0318   LDLCALC 132 (H) 10/06/2017 0318   LDLDIRECT 121 (H) 10/03/2013 1655       Other studies Reviewed: Additional studies/ records that were reviewed today include: ***.   ASSESSMENT AND PLAN:  1.  ***   Current medicines are reviewed with the patient today.  The patient Has no concerns regarding medicines.  The following changes have been made:  See above Labs/ tests ordered today include:see above  Disposition:   FU:  see above  Signed, Cecilie Kicks, NP  12/26/2017 10:33 AM    [place], [place], [address] [place], [place] Phone: [phone]; Fax: [phone]

## 2017-12-27 ENCOUNTER — Ambulatory Visit: Payer: Self-pay | Admitting: Cardiology

## 2017-12-27 DIAGNOSIS — J449 Chronic obstructive pulmonary disease, unspecified: Secondary | ICD-10-CM | POA: Diagnosis not present

## 2018-01-01 ENCOUNTER — Encounter (HOSPITAL_COMMUNITY): Payer: Self-pay | Admitting: Psychiatry

## 2018-01-01 ENCOUNTER — Ambulatory Visit (HOSPITAL_COMMUNITY): Payer: Medicare HMO | Admitting: Psychiatry

## 2018-01-01 DIAGNOSIS — Z818 Family history of other mental and behavioral disorders: Secondary | ICD-10-CM | POA: Diagnosis not present

## 2018-01-01 DIAGNOSIS — Z560 Unemployment, unspecified: Secondary | ICD-10-CM

## 2018-01-01 DIAGNOSIS — F901 Attention-deficit hyperactivity disorder, predominantly hyperactive type: Secondary | ICD-10-CM

## 2018-01-01 DIAGNOSIS — Z736 Limitation of activities due to disability: Secondary | ICD-10-CM

## 2018-01-01 DIAGNOSIS — Z811 Family history of alcohol abuse and dependence: Secondary | ICD-10-CM | POA: Diagnosis not present

## 2018-01-01 DIAGNOSIS — Z87891 Personal history of nicotine dependence: Secondary | ICD-10-CM

## 2018-01-01 MED ORDER — DULOXETINE HCL 60 MG PO CPEP: 60.0000 mg | ORAL_CAPSULE | Freq: Every day | ORAL | 2 refills | Status: DC

## 2018-01-01 MED ORDER — AMPHETAMINE-DEXTROAMPHET ER 30 MG PO CP24: 30.0000 mg | ORAL_CAPSULE | ORAL | 0 refills | Status: DC

## 2018-01-01 NOTE — Progress Notes (Signed)
BH MD/PA/NP OP Progress Note  01/01/2018 4:32 PM [name]  MRN:  [id number]  Chief Complaint:  Chief Complaint    Depression; Anxiety; Follow-up     HPI: This patient is a 52 year old married female who lives [place] in [place].  She is a past patient who was last seen 18 months ago.  [place] and wants to resume care here.  She is currently on disability.  The patient was last seen here on 05/07/2015.  At that time she was being treated for depression anxiety and ADHD.  [place] able to get in with a psychiatrist.  Her primary physician there continued her medications which included Cymbalta and Adderall and occasional Xanax.  The patient moved back to this area last September.  Her daughter was having difficulties.  Her daughter was developing severe hydradenitis suprativa and was also pregnant.  She and her boyfriend came back here to support the daughter.  Since then she is been hospitalized twice for pneumonia.  She is gotten off all of her medications for diabetes hypertension cholesterol etc. but has establish care with a primary care physician in the [facility] system.  Last month she was restarted on all of her medications.  However she is off all of her psychiatric medicines.  The patient states that without the Cymbalta she is somewhat depressed and is also experiencing more neuropathy in her feet.  She is off the Adderall and cannot stay focused at all.  She denies being seriously depressed or suicidal.  Her sleep is fairly good most of the time but the neuropathy keeps her up at night.  She is very concerned about her daughter who is now suffering from postpartum depression and is not getting adequate help.  The patient states that she could use Xanax but only very periodically and I think this is reasonable.  The patient returns after 2 months.  She states  that for the most part she is doing okay.  She is not able to take Cymbalta twice a day because it makes her very drowsy.  She just takes one at night and this is working well.  She states her mood has been good.  The Adderall does not really last very long for her and I suggested we switch to Adderall XR.  She rarely has use of the Xanax.  Her daughter is doing a little better and has found a new physician. Visit Diagnosis:    ICD-10-CM   1. Attention deficit hyperactivity disorder (ADHD), predominantly hyperactive type F90.1     Past Psychiatric History: 2 prior psychiatric hospitalizations in her 82s.  Her last hospitalization was 2006 after a drug overdose.  Since then she has received outpatient treatment  Past Medical History:  Past Medical History:  Diagnosis Date  . Anemia   . Anxiety   . Asthmatic bronchitis    normal PFT/ seen by pulmonary no evidence of COPD  . Atypical chest pain 10/05/2017  . Chest pain on respiration 03/25/2014  . Chronic bronchitis ([place])   . Chronic lower back pain   . Chronic respiratory failure ([place]) 10/16/2011   Newly 02 dep 24/7 p discharge from [facility] 01/2013  - 03/13/2014  Walked RA  2 laps @ 185 ft each stopped due to  Sob/ aching in legs, thirsty/ no desat @ slow pace   . Chronic respiratory  failure with hypoxia (HCC)    On 2-3 L of oxygen at home  . Cigarette smoker 12/02/2010   Followed in Pulmonary clinic/ [facility]/ [place]   - Limits of effective care reviewed 12/22/2011    . COPD (chronic obstructive pulmonary disease) ([place])   . COPD with chronic bronchitis ([place]) 10/01/2017  . Daily headache   . Depression   . Diabetic peripheral neuropathy ([place])   . DM (diabetes mellitus) type II controlled, neurological manifestation ([place]) 12/02/2010  . Gastric erosions    EGD 08/2010.  [name] GERD (gastroesophageal reflux disease)   . H/O drug abuse 11/12/2017   -- scanned document from outside source: [facility] in [place] which showed UDS positive for Adderall /amphetamine usage as well as positive urine for THC.  This test result was collected 08/11/2016 and reported 10/07/2016. - lso review of the chart shows another positive amphetamine, THC and METH in the urine back on 10/18/2015 under "care everywhere". ---So   . Heavy menses   . High cholesterol   . History of blood transfusion    "related to low HgB" (10/05/2017)  . History of hiatal hernia   . HTN (hypertension)   . Hyperlipidemia associated with type 2 diabetes mellitus ([place]) 10/18/2015  . Hypertension associated with diabetes ([place]) 12/02/2010   D/c acei 12/22/2011 due to psuedowheeze and narcotic dependent cough> ? Improved - 27/25/3664 started bystolic in place of cozar due to cough    . Increased urinary protein excretion   . Internal hemorrhoids    Colonoscopy 5/12.  . Iron deficiency anemia 10/01/2017  . Mixed diabetic hyperlipidemia associated with type 2 diabetes mellitus ([place]) 10/01/2017  . On home oxygen therapy    "5L at night" (10/05/2017)  . OSA on CPAP   . Osteoarthritis    "back" (10/05/2017)  . Oxygen dependent 10/16/2011  . Pneumonia    "lots of times" (10/05/2017)  . Poorly controlled diabetes mellitus ([place]) 11/12/2017  . Pulmonary infiltrates 12/22/2011   Followed in Pulmonary clinic/ [facility]/ [place]    - See CT Chest 05/05/11   . Shortness of breath   . Tachycardia    never had test done since no insurance  . Tobacco use disorder-current smoker greater than 40-pack-year history- since age 71 1 ppd 10/01/2017  . Type II diabetes mellitus ([place])     Past Surgical History:  Procedure Laterality Date  . CESAREAN SECTION  1988; 1989  . CHOLECYSTECTOMY OPEN  1990  . COLONOSCOPY  09/16/2010   [id number] colon/small internal hemorrhoids  . ESOPHAGOGASTRODUODENOSCOPY  09/16/2010   SLF: normal/mild gastritis  . ESOPHAGOGASTRODUODENOSCOPY N/A 10/19/2014   Procedure: ESOPHAGOGASTRODUODENOSCOPY (EGD);  Surgeon: Danie Binder, MD;   Location: [facility] SUITE;  Service: Endoscopy;  Laterality: N/A;  830  . FRACTURE SURGERY    . HYSTEROSCOPY WITH THERMACHOICE  01/17/2012   Procedure: HYSTEROSCOPY WITH THERMACHOICE;  Surgeon: Florian Buff, MD;  Location: [facility] ORS;  Service: Gynecology;  Laterality: N/A;  total therapy time: 9:13sec  D5W  18 ml in, D5W   52ml out, temperture 87degrees celcious  . KIDNEY SURGERY     as child for blockages  . TONSILLECTOMY    . TUBAL LIGATION  1989  . TYMPANOSTOMY TUBE PLACEMENT Bilateral    "several times when I was a child"  . uterine ablation    . WRIST FRACTURE SURGERY Left 1995    Family Psychiatric History: See below  Family History:  Family  History  Problem Relation Age of Onset  . Heart attack Father 8       deceased, etoh use  . Heart disease Father   . Alcohol abuse Father   . Depression Father   . Heart attack Mother 86       deceased  . Diabetes Mother   . Breast cancer Mother   . Heart failure Mother        oxygen dependence, nonsmoker  . Heart disease Mother   . Depression Mother   . Cancer Mother   . Liver disease Maternal Aunt 21       died while on liver transplant list  . Heart attack Maternal Grandmother        premature CAD  . Ulcers Sister   . Hypertension Sister   . Colon cancer Neg Hx     Social History:  Social History   Socioeconomic History  . Marital status: Married    Spouse name: Not on file  . Number of children: 2  . Years of education: Not on file  . Highest education level: Not on file  Occupational History  . Occupation: unemployed  Social Needs  . Financial resource strain: Not on file  . Food insecurity:    Worry: Not on file    Inability: Not on file  . Transportation needs:    Medical: Not on file    Non-medical: Not on file  Tobacco Use  . Smoking status: Current Every Day Smoker    Packs/day: 1.50    Years: 39.00    Pack years: 58.50    Types: Cigarettes  . Smokeless tobacco: Never Used  Substance and Sexual  Activity  . Alcohol use: Yes    Comment: 10/05/2017 "5 - 8 shots tequilia//month"  . Drug use: Yes    Types: Marijuana    Comment: 10/05/2017 "a few times/wk"  . Sexual activity: Yes    Birth control/protection: Surgical  Lifestyle  . Physical activity:    Days per week: Not on file    Minutes per session: Not on file  . Stress: Not on file  Relationships  . Social connections:    Talks on phone: Not on file    Gets together: Not on file    Attends religious service: Not on file    Active member of club or organization: Not on file    Attends meetings of clubs or organizations: Not on file    Relationship status: Not on file  Other Topics Concern  . Not on file  Social History Narrative  . Not on file    Allergies:  Allergies  Allergen Reactions  . Codeine Itching and Nausea Only  . Metformin And Related Diarrhea and Other (See Comments)    Stomach pain/nausea  . Wellbutrin [Bupropion Hcl] Hives  . Acyclovir And Related Rash    Metabolic Disorder Labs: Lab Results  Component Value Date   HGBA1C 10.2 (A) 10/01/2017   MPG 160 (H) 03/10/2015   MPG 177 (H) 08/31/2014   No results found for: PROLACTIN Lab Results  Component Value Date   CHOL 192 10/06/2017   TRIG 177 (H) 10/06/2017   HDL 25 (L) 10/06/2017   CHOLHDL 7.7 10/06/2017   VLDL 35 10/06/2017   LDLCALC 132 (H) 10/06/2017   LDLCALC 84 03/10/2015   Lab Results  Component Value Date   TSH 1.961 10/05/2017   TSH 1.759 09/15/2014    Therapeutic Level Labs: No results found for: LITHIUM No  results found for: VALPROATE No components found for:  CBMZ  Current Medications: Current Outpatient Medications  Medication Sig Dispense Refill  . acetaminophen (TYLENOL) 500 MG tablet Take 1,000 mg by mouth every 6 (six) hours as needed for mild pain or headache.    . ALPRAZolam (XANAX) 0.5 MG tablet Take 1 tablet (0.5 mg total) by mouth daily as needed for anxiety. 30 tablet 2  . amoxicillin (AMOXIL) 500 MG  capsule Take 2 capsules (1,000 mg total) by mouth 3 (three) times daily. 60 capsule 0  . amphetamine-dextroamphetamine (ADDERALL) 20 MG tablet Take 1 tablet (20 mg total) by mouth 2 (two) times daily. 60 tablet 0  . aspirin EC 81 MG tablet Take 1 tablet (81 mg total) by mouth daily. 90 tablet 3  . atorvastatin (LIPITOR) 80 MG tablet Take 1 tablet (80 mg total) by mouth daily. 90 tablet 3  . DULoxetine (CYMBALTA) 60 MG capsule Take 1 capsule (60 mg total) by mouth at bedtime. 30 capsule 2  . fluticasone (FLONASE) 50 MCG/ACT nasal spray 1 spray each nostril after sinus rinses twice daily 16 g 2  . INVOKANA 300 MG TABS tablet TAKE 1 TABLET BY MOUTH DAILY BEFORE BREAKFAST. 60 tablet 0  . isosorbide mononitrate (IMDUR) 30 MG 24 hr tablet Take 1 tablet (30 mg total) by mouth daily. 90 tablet 3  . liraglutide (VICTOZA) 18 MG/3ML SOPN Inject 1.2 mg into the skin daily.    [name] losartan-hydrochlorothiazide (HYZAAR) 100-12.5 MG tablet TAKE 1 TABLET BY MOUTH DAILY. 60 tablet 0  . meclizine (ANTIVERT) 25 MG tablet Take 1 tablet (25 mg total) by mouth 3 (three) times daily as needed for dizziness or nausea. 30 tablet 0  . pantoprazole (PROTONIX) 40 MG tablet Take 1 tablet (40 mg total) by mouth daily. 30 minutes before a meal. 30 tablet 1  . pregabalin (LYRICA) 75 MG capsule Take 1 capsule (75 mg total) by mouth 3 (three) times daily. 90 capsule 5  . Vitamin D, Ergocalciferol, (DRISDOL) 50000 units CAPS capsule Take 1 capsule (50,000 Units total) by mouth every 7 (seven) days. 12 capsule 10  . amphetamine-dextroamphetamine (ADDERALL XR) 30 MG 24 hr capsule Take 1 capsule (30 mg total) by mouth every morning. 30 capsule 0  . amphetamine-dextroamphetamine (ADDERALL XR) 30 MG 24 hr capsule Take 1 capsule (30 mg total) by mouth every morning. 30 capsule 0   No current facility-administered medications for this visit.      Musculoskeletal: Strength & Muscle Tone: within normal limits Gait & Station:  normal Patient leans: N/A  Psychiatric Specialty Exam: Review of Systems  Neurological: Positive for tingling.  All other systems reviewed and are negative.   Blood pressure 116/74, pulse (!) 113, height 5\' 4"  (1.626 m), weight 255 lb (115.7 kg), SpO2 95 %.Body mass index is 43.77 kg/m.  General Appearance: Casual and Fairly Groomed  Eye Contact:  Good  Speech:  Clear and Coherent  Volume:  Normal  Mood:  Euthymic  Affect:  Congruent  Thought Process:  Goal Directed  Orientation:  Full (Time, Place, and Person)  Thought Content: WDL   Suicidal Thoughts:  No  Homicidal Thoughts:  No  Memory:  Immediate;   Good Recent;   Good Remote;   Good  Judgement:  Fair  Insight:  Fair  Psychomotor Activity:  Normal  Concentration:  Concentration: Good and Attention Span: Good  Recall:  Good  Fund of Knowledge: Fair  Language: Good  Akathisia:  No  Handed:  Right  AIMS (if indicated): not done  Assets:  Communication Skills Desire for Improvement Resilience Social Support Talents/Skills  ADL's:  Intact  Cognition: WNL  Sleep:  Good   Screenings: AIMS     Admission (Discharged) from 09/12/2014 in [place] [facility]  AIMS Total Score  0    AUDIT     Admission (Discharged) from 09/12/2014 in [place] [facility]  Alcohol Use Disorder Identification Test Final Score (AUDIT)  0    PHQ2-9     Office Visit from 12/07/2017 in [place] at [facility] Visit from 11/12/2017 in [place] at [facility] Visit from 10/01/2017 in [place] at [facility] Total Score  2  1  2   PHQ-9 Total Score  10  8  10        Assessment and Plan: This patient is a 52 year old female with a history of depression and anxiety.  She states she is doing fairly well with just the one Cymbalta daily so she will continue 60 mg at bedtime.  She will change her Adderall to Adderall XR 30 mg every  morning ADD and continue Xanax 0.5 mg as needed for anxiety.  She will return to see me in 2 months   [name], MD 01/01/2018, 4:32 PM

## 2018-01-10 ENCOUNTER — Encounter: Payer: Self-pay | Admitting: Family Medicine

## 2018-01-10 ENCOUNTER — Ambulatory Visit: Payer: Medicare HMO

## 2018-01-10 ENCOUNTER — Ambulatory Visit (INDEPENDENT_AMBULATORY_CARE_PROVIDER_SITE_OTHER): Payer: Medicare HMO | Admitting: Family Medicine

## 2018-01-10 VITALS — BP 127/77 | HR 108 | Ht 64.0 in | Wt 251.1 lb

## 2018-01-10 DIAGNOSIS — J449 Chronic obstructive pulmonary disease, unspecified: Secondary | ICD-10-CM | POA: Diagnosis not present

## 2018-01-10 DIAGNOSIS — R05 Cough: Secondary | ICD-10-CM | POA: Diagnosis not present

## 2018-01-10 DIAGNOSIS — Z716 Tobacco abuse counseling: Secondary | ICD-10-CM

## 2018-01-10 DIAGNOSIS — E138 Other specified diabetes mellitus with unspecified complications: Secondary | ICD-10-CM | POA: Diagnosis not present

## 2018-01-10 DIAGNOSIS — R059 Cough, unspecified: Secondary | ICD-10-CM

## 2018-01-10 DIAGNOSIS — Z720 Tobacco use: Secondary | ICD-10-CM

## 2018-01-10 DIAGNOSIS — J441 Chronic obstructive pulmonary disease with (acute) exacerbation: Secondary | ICD-10-CM | POA: Diagnosis not present

## 2018-01-10 DIAGNOSIS — R062 Wheezing: Secondary | ICD-10-CM | POA: Diagnosis not present

## 2018-01-10 LAB — POCT GLYCOSYLATED HEMOGLOBIN (HGB A1C): Hemoglobin A1C: 8 % — AB (ref 4.0–5.6)

## 2018-01-10 MED ORDER — LEVOFLOXACIN 750 MG PO TABS: 750.0000 mg | ORAL_TABLET | Freq: Every day | ORAL | 0 refills | Status: DC

## 2018-01-10 MED ORDER — IPRATROPIUM-ALBUTEROL 0.5-2.5 (3) MG/3ML IN SOLN
3.0000 mL | Freq: Four times a day (QID) | RESPIRATORY_TRACT | Status: DC
  Administered 2018-01-10: 3 mL via RESPIRATORY_TRACT

## 2018-01-10 MED ORDER — HYDROCOD POLST-CPM POLST ER 10-8 MG/5ML PO SUER: 5.0000 mL | Freq: Two times a day (BID) | ORAL | 0 refills | Status: DC | PRN

## 2018-01-10 MED ORDER — FLUTICASONE-SALMETEROL 250-50 MCG/DOSE IN AEPB: 1.0000 | INHALATION_SPRAY | Freq: Two times a day (BID) | RESPIRATORY_TRACT | 3 refills | Status: DC

## 2018-01-10 MED ORDER — FLUCONAZOLE 150 MG PO TABS: ORAL_TABLET | ORAL | 1 refills | Status: DC

## 2018-01-10 MED ORDER — PREDNISONE 20 MG PO TABS: ORAL_TABLET | ORAL | 0 refills | Status: DC

## 2018-01-10 NOTE — Patient Instructions (Addendum)
Please make sure you are keeping extra check on your blood sugars as you know steroids can make your sugars go up.  If they are increasing too much please half the dose of the steroids and continue the course but at half dose.  If that still drives it up too much, please stop it.  Please feel free to contact us with any questions or concerns  I know you told me that you have taken codeine cough medicine in the past and have done well with it without side effect, that is why we gave it you again.  However if you have any problems or concerns please let us know   Please think seriously about quitting smoking!  This is very important for your health and well being.   Smoking cessation instruction/counseling given:  counseled patient on the dangers of tobacco use, advised patient to stop smoking, and reviewed strategies to maximize success  Discussed with patient that there are multiple treatments to aid in quitting smoking, however I explained none will work unless pt really wants to quit  Please let us know in the future if you are interested and ready to quit.  You can also call 1-800-QUIT-NOW (231) 145-6131) for free smoking cessation counseling and support.     Also, please go online to www.heart.org (the American Heart Association website) and search "quit smoking ".     Or try the centers for disease control website at: https://www.schmidt.com/  Or, go to the "national cancer institute" web site of NIH:  http://benson.com/  There is a lot of great information on these websites for you to look over.      Want to Quit Smoking? FDA-Approved Products Can Help  Quitting smoking can be hard, but it is possible. In fact, every time you put out a cigarette is a new chance to try quitting again, according to the U.S. Food and Drug Administration's newest tobacco education campaign, "Every Try Counts."    If you want to quit-almost 70 percent of adult smokers say they do-you may want to use a "smoking cessation" product proven to help. Data has shown that using FDA-approved cessation medicine can double your chance of quitting successfully.  Some products contain nicotine as an active ingredient and others do not. These products include over-the-counter (OTC) options like skin patches, lozenges, and gum, as well as prescription medicines.  Smoking cessation products are intended to help you quit smoking. They are regulated through the [facility] Center for Drug Evaluation and Research, which ensures that the products are safe and effective and that their benefits outweigh any known associated risks.  The Benefits of Quitting Smoking No matter how much you smoke-or for how long-quitting will benefit you.  Not only will you lower your risk of getting various cancers, including lung cancer, you'll also reduce your chances of having heart disease, a stroke, emphysema, and other serious diseases. Quitting also will lower the risk of heart disease and lung cancer in nonsmokers who otherwise would be exposed to your secondhand smoke.  Although there are benefits to quitting at any age, it is important to quit as soon as possible so your body can begin to heal from the damage caused by smoking. For instance, 12 hours after you quit smoking the carbon monoxide level in your blood drops to normal. Carbon monoxide is harmful because it displaces oxygen in the blood and deprives your heart, brain, and other vital organs of oxygen.  What To Know About Smoking Cessation Products Understanding  how smoking cessation products work-and what side effects they may cause-can help you determine which product may be best for you.  If you're considering one of these products, reading labels and talking to your pharmacist and other health care providers are good first steps to take.  You also can check the FDA's website for more  information on each product at Drugs@FDA , where you can search for each product by name.  And remember to weigh each product's benefits and risks, among other considerations.  About Nicotine Replacement Therapy (NRT) Nicotine is the substance primarily responsible for causing addiction to tobacco products. Tobacco users who are addicted to nicotine are used to having nicotine in their bodies.  As you try to quit smoking, you may have symptoms of nicotine withdrawal. When you quit, this withdrawal may cause symptoms like cravings, or urges, to smoke; depression; trouble sleeping; irritability; anxiety; and increased appetite.  Nicotine withdrawal can discourage some smokers from continuing with a quit attempt. But the FDA has approved several smoking cessation products designed to help users gradually withdraw from smoking (that is, "wean" themselves from smoking) by using specific amounts of nicotine that decrease over time. This type of product is called a "nicotine replacement therapy" or NRT. It supplies nicotine in controlled amounts while sparing you from other chemicals found in tobacco products.  NRTs are available over the counter and by prescription. You should generally use them only for a short time to help you manage nicotine cravings and withdrawal. However, the FDA recognizes that some people may need to use these products longer to stay smoke-free. Talk to your health care provider to determine the best course of treatment for you.  Over-the-counter NRTs are approved for sale to people age 66 and older. They are available under various brand names and sometimes as generic products. They include:  - Skin patches (also called "transdermal nicotine patches"). These patches are placed on the skin, similar to how you would apply an adhesive bandage. - Chewing gum (also called "nicotine gum"). This gum must be chewed according to the labeled instructions to be effective. - Lozenges (also  called "nicotine lozenges"). You use these products by dissolving them in your mouth. For over-the-counter products, it's important to follow the instructions on the Drug Facts Label (DFL) and to read the enclosed User's Guide for complete directions and other important information. Ask your health care provider if you have questions.  Currently, prescription nicotine replacement therapy is available only under the brand name Nicotrol, and is available both as a nasal spray and an oral inhaler. The products are FDA-approved only for use by adults.  If you are under age 81 and want to quit smoking, talk to a health care professional about whether you should use nicotine replacement therapies.  Important Advice for People Considering Nicotine Replacement Therapy Women who are pregnant or breastfeeding should talk to their health care providers and use nicotine replacement products only if the health care providers approve.  Also talk to your health care provider before using these products if you have:  diabetes, heart disease, asthma, or stomach ulcers; had a recent heart attack; high blood pressure that is not controlled with medicine; a history of irregular heartbeat; or been prescribed medication to help you quit smoking. If you take prescription medication for depression or asthma, tell your health care provider if you are quitting smoking because he or she may need to change your prescription dose.  Stop using a nicotine replacement product and  call your health care professional if you have any of the following symptoms: nausea; dizziness; weakness; vomiting; fast or irregular heartbeat; mouth problems with the lozenge or gum; or redness or swelling of the skin around the patch that does not go away.  About Prescription Cessation Medicines Without Nicotine  The FDA has approved two smoking cessation products that do not contain nicotine. They are Chantix (varenicline tartrate) and  Zyban (buproprion hydrochloride). Both are available in tablet form and by prescription only.  Chantix acts at sites in the brain affected by nicotine by reducing the rewarding effects of nicotine. The precise way that Zyban helps with smoking cessation is unknown.  As with other prescription products, the FDA has evaluated these medicines and found that the benefits outweigh the risks. For users taking these products, risks include changes in behavior, depressed mood, hostility, aggression, and suicidal thoughts or actions.  The most common side effects of Chantix include nausea; constipation; gas; vomiting; and trouble sleeping or vivid, unusual, or strange dreams. Chantix also may change how you react to alcohol, so talk to your health care provider about your drinking habits (if you drink alcohol) and whether these habits need to change. Chantix is not recommended for people under the age of 41.  The most commonly observed side effects consistently associated with the use of Zyban are dry mouth and insomnia.  Because Zyban contains the same active ingredient as the antidepressant Wellbutrin (bupropion), the FDA encourages people who use Zyban-and those who are considering it-to talk to their health care providers about the risks of treatment with antidepressant medicines. Zyban has not been studied in children under the age of 5 and is not approved for use in children and teenagers.  Note: If your health care provider prescribes Chantix or Zyban, please read the product's patient medication guide in its entirety. These guides offer important information on side effects, risks, warnings, product ingredients, and what you should talk about with your health care provider before taking the products.  Finally, if you ever have any side effects related to any smoking cessation products, or have any other problems related to your treatment, the FDA would like to hear from you. Please consider making a  voluntary and confidential report to the FDA's MedWatch program.  Updated: April 03, 2016

## 2018-01-23 ENCOUNTER — Encounter: Payer: Self-pay | Admitting: *Deleted

## 2018-01-24 ENCOUNTER — Other Ambulatory Visit: Payer: Self-pay | Admitting: Family Medicine

## 2018-01-24 DIAGNOSIS — E1142 Type 2 diabetes mellitus with diabetic polyneuropathy: Secondary | ICD-10-CM

## 2018-01-24 DIAGNOSIS — E1129 Type 2 diabetes mellitus with other diabetic kidney complication: Secondary | ICD-10-CM

## 2018-01-24 DIAGNOSIS — R809 Proteinuria, unspecified: Principal | ICD-10-CM

## 2018-01-24 DIAGNOSIS — E1165 Type 2 diabetes mellitus with hyperglycemia: Secondary | ICD-10-CM

## 2018-02-04 ENCOUNTER — Other Ambulatory Visit (HOSPITAL_COMMUNITY): Payer: Self-pay | Admitting: Psychiatry

## 2018-02-04 MED ORDER — AMPHETAMINE-DEXTROAMPHETAMINE 20 MG PO TABS: 20.0000 mg | ORAL_TABLET | Freq: Two times a day (BID) | ORAL | 0 refills | Status: DC

## 2018-02-12 ENCOUNTER — Other Ambulatory Visit: Payer: Medicare HMO

## 2018-02-12 DIAGNOSIS — E1169 Type 2 diabetes mellitus with other specified complication: Secondary | ICD-10-CM | POA: Diagnosis not present

## 2018-02-12 DIAGNOSIS — E559 Vitamin D deficiency, unspecified: Secondary | ICD-10-CM

## 2018-02-12 DIAGNOSIS — E1159 Type 2 diabetes mellitus with other circulatory complications: Secondary | ICD-10-CM

## 2018-02-12 DIAGNOSIS — I10 Essential (primary) hypertension: Secondary | ICD-10-CM | POA: Diagnosis not present

## 2018-02-12 DIAGNOSIS — E785 Hyperlipidemia, unspecified: Secondary | ICD-10-CM | POA: Diagnosis not present

## 2018-02-12 DIAGNOSIS — D72829 Elevated white blood cell count, unspecified: Secondary | ICD-10-CM

## 2018-02-12 DIAGNOSIS — E1142 Type 2 diabetes mellitus with diabetic polyneuropathy: Secondary | ICD-10-CM | POA: Diagnosis not present

## 2018-02-13 ENCOUNTER — Ambulatory Visit: Payer: Self-pay | Admitting: Family Medicine

## 2018-02-13 LAB — CBC WITH DIFFERENTIAL/PLATELET
Basophils Absolute: 0 10*3/uL (ref 0.0–0.2)
Basos: 0 %
EOS (ABSOLUTE): 0.2 10*3/uL (ref 0.0–0.4)
Eos: 2 %
Hematocrit: 41.7 % (ref 34.0–46.6)
Hemoglobin: 13.5 g/dL (ref 11.1–15.9)
Immature Grans (Abs): 0 10*3/uL (ref 0.0–0.1)
Immature Granulocytes: 0 %
Lymphocytes Absolute: 3.4 10*3/uL — ABNORMAL HIGH (ref 0.7–3.1)
Lymphs: 30 %
MCH: 25.8 pg — ABNORMAL LOW (ref 26.6–33.0)
MCHC: 32.4 g/dL (ref 31.5–35.7)
MCV: 80 fL (ref 79–97)
Monocytes Absolute: 0.8 10*3/uL (ref 0.1–0.9)
Monocytes: 7 %
Neutrophils Absolute: 6.7 10*3/uL (ref 1.4–7.0)
Neutrophils: 61 %
Platelets: 284 10*3/uL (ref 150–450)
RBC: 5.24 x10E6/uL (ref 3.77–5.28)
RDW: 15.4 % (ref 12.3–15.4)
WBC: 11.2 10*3/uL — ABNORMAL HIGH (ref 3.4–10.8)

## 2018-02-13 LAB — COMPREHENSIVE METABOLIC PANEL
ALT: 16 IU/L (ref 0–32)
AST: 17 IU/L (ref 0–40)
Albumin/Globulin Ratio: 1.6 (ref 1.2–2.2)
Albumin: 3.9 g/dL (ref 3.5–5.5)
Alkaline Phosphatase: 66 IU/L (ref 39–117)
BUN/Creatinine Ratio: 10 (ref 9–23)
BUN: 6 mg/dL (ref 6–24)
Bilirubin Total: 0.3 mg/dL (ref 0.0–1.2)
CO2: 21 mmol/L (ref 20–29)
Calcium: 9.2 mg/dL (ref 8.7–10.2)
Chloride: 105 mmol/L (ref 96–106)
Creatinine, Ser: 0.62 mg/dL (ref 0.57–1.00)
GFR calc Af Amer: 121 mL/min/{1.73_m2} (ref >59)
GFR calc non Af Amer: 105 mL/min/{1.73_m2} (ref >59)
Globulin, Total: 2.5 g/dL (ref 1.5–4.5)
Glucose: 176 mg/dL — ABNORMAL HIGH (ref 65–99)
Potassium: 4 mmol/L (ref 3.5–5.2)
Sodium: 143 mmol/L (ref 134–144)
Total Protein: 6.4 g/dL (ref 6.0–8.5)

## 2018-02-13 LAB — LIPID PANEL
Chol/HDL Ratio: 3.4 ratio (ref 0.0–4.4)
Cholesterol, Total: 131 mg/dL (ref 100–199)
HDL: 38 mg/dL — ABNORMAL LOW (ref >39)
LDL Calculated: 74 mg/dL (ref 0–99)
Triglycerides: 95 mg/dL (ref 0–149)
VLDL Cholesterol Cal: 19 mg/dL (ref 5–40)

## 2018-02-13 LAB — HEMOGLOBIN A1C
Est. average glucose Bld gHb Est-mCnc: 177 mg/dL
Hgb A1c MFr Bld: 7.8 % — ABNORMAL HIGH (ref 4.8–5.6)

## 2018-02-13 LAB — VITAMIN D 25 HYDROXY (VIT D DEFICIENCY, FRACTURES): Vit D, 25-Hydroxy: 36.3 ng/mL (ref 30.0–100.0)

## 2018-02-14 NOTE — Progress Notes (Signed)
Acute Care Office visit  Assessment and plan:  1. COPD with acute exacerbation ([place])   2. Cough   3. Diabetes mellitus with complication, without long-term current use of insulin ([place])   4. Inspiratory wheeze on examination   5. Tobacco abuse   6. Tobacco abuse counseling   7. COPD with chronic bronchitis ([place])    1. Cough - ipratropium-albuterol (DUONEB) 0.5-2.5 (3) MG/3ML nebulizer solution 3 mL - DG Chest 2 View; Future - chlorpheniramine-HYDROcodone (TUSSIONEX) 10-8 MG/5ML SUER; Take 5 mLs by mouth every 12 (twelve) hours as needed for cough (cough, will cause drowsiness.).  Dispense: 120 mL; Refill: 0  2. Diabetes mellitus with complication, without long-term current use of insulin (HCC) - POCT glycosylated hemoglobin (Hb A1C) - HM Diabetes Foot Exam  3. COPD with acute exacerbation (HCC) - predniSONE (DELTASONE) 20 MG tablet; Take 3 tabs po * 2 days, then 2 tabs for 2 d, then 1 tab 2 d, then 1/2 tab 2 days.  Dispense: 15 tablet; Refill: 0 - levofloxacin (LEVAQUIN) 750 MG tablet; Take 1 tablet (750 mg total) by mouth daily.  Dispense: 7 tablet; Refill: 0  4. Inspiratory wheeze on examination -Will be more consistent with inhaler txmnt  5. Tobacco abuse Extensive counseling done.  Patient not ready for change  6. Tobacco abuse counseling  7. COPD with chronic bronchitis ([place])-     acute exaccerbation - Fluticasone-Salmeterol (ADVAIR DISKUS) 250-50 MCG/DOSE AEPB; Inhale 1 puff into the lungs 2 (two) times daily. Flush mouth after each use  Dispense: 1 each; Refill: 3   - Supportive care and various OTC medications discussed in addition to any prescribed. - Call or RTC if new symptoms, or if no improvement or worse over next several days.      Meds ordered this encounter  Medications  . ipratropium-albuterol (DUONEB) 0.5-2.5 (3) MG/3ML nebulizer solution 3 mL  . predniSONE (DELTASONE) 20 MG tablet    Sig: Take 3 tabs po * 2 days, then 2 tabs for 2 d, then 1 tab 2  d, then 1/2 tab 2 days.    Dispense:  15 tablet    Refill:  0  . levofloxacin (LEVAQUIN) 750 MG tablet    Sig: Take 1 tablet (750 mg total) by mouth daily.    Dispense:  7 tablet    Refill:  0  . chlorpheniramine-HYDROcodone (TUSSIONEX) 10-8 MG/5ML SUER    Sig: Take 5 mLs by mouth every 12 (twelve) hours as needed for cough (cough, will cause drowsiness.).    Dispense:  120 mL    Refill:  0  . Fluticasone-Salmeterol (ADVAIR DISKUS) 250-50 MCG/DOSE AEPB    Sig: Inhale 1 puff into the lungs 2 (two) times daily. Flush mouth after each use    Dispense:  1 each    Refill:  3  . fluconazole (DIFLUCAN) 150 MG tablet    Sig: 1 tablet after 5 days of antibiotics then repeat weekly x2    Dispense:  3 tablet    Refill:  1    Orders Placed This Encounter  Procedures  . DG Chest 2 View  . POCT glycosylated hemoglobin (Hb A1C)  . HM Diabetes Foot Exam    Gross side effects, risk and benefits, and alternatives of medications discussed with patient.  Patient is aware that all medications have potential side effects and we are unable to predict every sideeffect or drug-drug interaction that may occur.  Expresses verbal understanding and consents to current therapy plan  and treatment regiment.   Education and routine counseling performed. Handouts provided.  Anticipatory guidance and routine counseling done re: condition, txmnt options and need for follow up. All questions of patient's were answered.  Return if symptoms worsen or fail to improve, for Please follow-up chronic care as well..  Please see AVS handed out to patient at the end of our visit for additional patient instructions/ counseling done pertaining to today's office visit.  Note:  This document was partially repared using Dragon voice recognition software and may include unintentional dictation errors.    Subjective:    Chief Complaint  Patient presents with  . Cough    HPI:  Pt presents with Sx for 7-10 days   C/o:  head congestion, productive cough, SOB- little worse than usual, feels like an exaccerbation of her COPD.  Some discomfort upon deep inspiration- retrosternal with deep breaths only, feels warm/ cold at times but not sure if Fevers.   Denies: CP with exertion that is relieved with rest    For symptoms patient has tried:  NI over past week with OTC meds  Overall getting:   NI- a little worse   Patient Care Team    Relationship Specialty Notifications Start End  Mellody Dance, DO PCP - General Family Medicine  10/05/17   Belva Crome, MD PCP - Cardiology Cardiology Admissions 10/05/17   Danie Binder, MD  Gastroenterology  09/08/10     Past medical history, Surgical history, Family history reviewed and noted below, Social history, Allergies, and Medications have been entered into the medical record, reviewed and changed as needed.    Allergies  Allergen Reactions  . Codeine Itching and Nausea Only  . Metformin And Related Diarrhea and Other (See Comments)    Stomach pain/nausea  . Wellbutrin [Bupropion Hcl] Hives  . Acyclovir And Related Rash    Review of Systems: - see above HPI for pertinent positives General:   No F/C, wt loss Pulm:   No DIB,+ SOB, possible pleuritic chest pain Card:  No CP, palpitations Abd:  No n/v/d or pain Ext:  No inc edema from baseline   Objective:   Blood pressure 127/77, pulse (!) 108, height 5\' 4"  (1.626 m), weight 251 lb 1.6 oz (113.9 kg), SpO2 96 %. Body mass index is 43.1 kg/m. General: Well Developed, well nourished, appropriate for stated age.  Neuro: Alert and oriented x3, extra-ocular muscles intact, sensation grossly intact.  HEENT: Normocephalic, atraumatic, pupils equal round reactive to light, neck supple, no masses, no painful lymphadenopathy, TM's intact B/L, no acute findings. Nares- patent, clear d/c, OP- clear, mild erythema, No TTP sinuses Skin: Warm and dry, no gross rash. Cardiac: RRR, S1 S2,  no murmurs rubs or gallops.    Respiratory: dec aeration globally, rhonchorous BS throughout, inc exhalation phase and , Not using accessory muscles, speaking in full sentences. Vascular:  No gross lower ext edema, cap RF less 2 sec. Psych: No HI/SI, judgement and insight good, Euthymic mood. Full Affect.

## 2018-02-27 ENCOUNTER — Other Ambulatory Visit: Payer: Self-pay | Admitting: Family Medicine

## 2018-02-27 ENCOUNTER — Ambulatory Visit: Payer: Medicare HMO | Admitting: Family Medicine

## 2018-03-05 ENCOUNTER — Ambulatory Visit (HOSPITAL_COMMUNITY): Payer: Self-pay | Admitting: Psychiatry

## 2018-03-08 ENCOUNTER — Ambulatory Visit (INDEPENDENT_AMBULATORY_CARE_PROVIDER_SITE_OTHER): Payer: Medicare HMO | Admitting: Psychiatry

## 2018-03-08 ENCOUNTER — Encounter (HOSPITAL_COMMUNITY): Payer: Self-pay | Admitting: Psychiatry

## 2018-03-08 VITALS — BP 121/71 | HR 99 | Ht 64.0 in | Wt 248.0 lb

## 2018-03-08 DIAGNOSIS — F901 Attention-deficit hyperactivity disorder, predominantly hyperactive type: Secondary | ICD-10-CM

## 2018-03-08 DIAGNOSIS — F331 Major depressive disorder, recurrent, moderate: Secondary | ICD-10-CM | POA: Diagnosis not present

## 2018-03-08 MED ORDER — AMPHETAMINE-DEXTROAMPHETAMINE 30 MG PO TABS: 30.0000 mg | ORAL_TABLET | Freq: Two times a day (BID) | ORAL | 0 refills | Status: DC

## 2018-03-08 MED ORDER — DULOXETINE HCL 60 MG PO CPEP: 60.0000 mg | ORAL_CAPSULE | Freq: Every day | ORAL | 2 refills | Status: DC

## 2018-03-08 MED ORDER — ALPRAZOLAM 0.5 MG PO TABS: 0.5000 mg | ORAL_TABLET | Freq: Every day | ORAL | 2 refills | Status: DC | PRN

## 2018-03-08 NOTE — Progress Notes (Signed)
BH MD/PA/NP OP Progress Note  03/08/2018 11:45 AM [name]  MRN:  [id number]  Chief Complaint:  Chief Complaint    ADD; Depression; Anxiety; Follow-up     HPI: This patient is a 52 year old married [place] and wants to resume care here. She is currently on disability.  The patient was last seen here on 05/07/2015. At that time she was being treated for depression anxiety and ADHD. [place] to get in with a psychiatrist. Her primary physician there continued her medications which included Cymbalta and Adderall and occasional Xanax.  The patient moved back to this area last September. Her daughter was having difficulties. Her daughter was developing severe hydradenitis suprativaand was also pregnant. She and her boyfriend came back here to support the daughter. Since then she is been hospitalized twice for pneumonia. She is gotten off all of her medications for diabetes hypertension cholesterol etc. but has establish care with a primary care physician in the [facility] system. Last month she was restarted on all of her medications. However she is off all of her psychiatric medicines.  The patient states that without the Cymbalta she is somewhat depressed and is also experiencing more neuropathy in her feet. She is off the Adderall and cannot stay focused at all. She denies being seriously depressed or suicidal. Her sleep is fairly good most of the time but the neuropathy keeps her up at night. She is very concerned about her daughter who is now suffering from postpartum depression and is not getting adequate help. The patient states that she could use Xanax but only very periodically and I think this is reasonable.  The patient returns after 2 months.  We tried to switch her to Adderall XR  to get it to last longer but her insurance would not cover it.  The Adderall 20 mg twice daily is working that well for her because it wears off early.  I suggested that we go to the 30 mg twice daily and she is willing to try it.  Her mood has been stable she is sleeping well and she is enjoying time with her family and grandchildren. Visit Diagnosis:    ICD-10-CM   1. Attention deficit hyperactivity disorder (ADHD), predominantly hyperactive type F90.1   2. Moderate episode of recurrent major depressive disorder ([place]) F33.1     Past Psychiatric History: 2 prior psychiatric hospitalizations in her 76s.  Her last hospitalization was in 2006 after a drug overdose.  Since then she has received outpatient treatment  Past Medical History:  Past Medical History:  Diagnosis Date  . Anemia   . Anxiety   . Asthmatic bronchitis    normal PFT/ seen by pulmonary no evidence of COPD  . Atypical chest pain 10/05/2017  . Chest pain on respiration 03/25/2014  . Chronic bronchitis ([place])   . Chronic lower back pain   . Chronic respiratory failure ([place]) 10/16/2011   Newly 02 dep 24/7 p discharge from [facility] 01/2013  - 03/13/2014  Walked RA  2 laps @ 185 ft each stopped due to  Sob/ aching in legs, thirsty/ no desat @ slow pace   . Chronic respiratory failure with hypoxia (HCC)    On 2-3 L of oxygen at home  . Cigarette smoker 12/02/2010   Followed in Pulmonary clinic/  [facility]/ [place]   - Limits of effective care reviewed 12/22/2011    . COPD (chronic obstructive pulmonary disease) ([place])   . COPD with chronic bronchitis ([place]) 10/01/2017  . Daily headache   . Depression   . Diabetic peripheral neuropathy ([place])   . DM (diabetes mellitus) type II controlled, neurological manifestation ([place]) 12/02/2010  . Gastric erosions    EGD 08/2010.  [name] GERD (gastroesophageal reflux disease)   . H/O drug abuse ([place]) 11/12/2017   -- scanned document from outside source: [facility] in  [place] which showed UDS positive for Adderall /amphetamine usage as well as positive urine for THC.  This test result was collected 08/11/2016 and reported 10/07/2016. - lso review of the chart shows another positive amphetamine, THC and METH in the urine back on 10/18/2015 under "care everywhere". ---So   . Heavy menses   . High cholesterol   . History of blood transfusion    "related to low HgB" (10/05/2017)  . History of hiatal hernia   . HTN (hypertension)   . Hyperlipidemia associated with type 2 diabetes mellitus ([place]) 10/18/2015  . Hypertension associated with diabetes ([place]) 12/02/2010   D/c acei 12/22/2011 due to psuedowheeze and narcotic dependent cough> ? Improved - 95/12/3265 started bystolic in place of cozar due to cough    . Increased urinary protein excretion   . Internal hemorrhoids    Colonoscopy 5/12.  . Iron deficiency anemia 10/01/2017  . Mixed diabetic hyperlipidemia associated with type 2 diabetes mellitus ([place]) 10/01/2017  . On home oxygen therapy    "5L at night" (10/05/2017)  . OSA on CPAP   . Osteoarthritis    "back" (10/05/2017)  . Oxygen dependent 10/16/2011  . Pneumonia    "lots of times" (10/05/2017)  . Poorly controlled diabetes mellitus ([place]) 11/12/2017  . Pulmonary infiltrates 12/22/2011   Followed in Pulmonary clinic/ [facility]/ [place]    - See CT Chest 05/05/11   . Shortness of breath   . Tachycardia    never had test done since no insurance  . Tobacco use disorder-current smoker greater than 40-pack-year history- since age 19 1 ppd 10/01/2017  . Type II diabetes mellitus ([place])     Past Surgical History:  Procedure Laterality Date  . CESAREAN SECTION  1988; 1989  . CHOLECYSTECTOMY OPEN  1990  . COLONOSCOPY  09/16/2010   TIW:PYKDXI colon/small internal hemorrhoids  . ESOPHAGOGASTRODUODENOSCOPY  09/16/2010   SLF: normal/mild gastritis  . ESOPHAGOGASTRODUODENOSCOPY N/A 10/19/2014   Procedure: ESOPHAGOGASTRODUODENOSCOPY (EGD);  Surgeon: Danie Binder, MD;  Location: [facility] ENDO SUITE;  Service: Endoscopy;  Laterality: N/A;  830  . FRACTURE SURGERY    . HYSTEROSCOPY WITH THERMACHOICE  01/17/2012   Procedure: HYSTEROSCOPY WITH THERMACHOICE;  Surgeon: Florian Buff, MD;  Location: [facility] ORS;  Service: Gynecology;  Laterality: N/A;  total therapy time: 9:13sec  D5W  18 ml in, D5W   26ml out, temperture 87degrees celcious  . KIDNEY SURGERY     as child for blockages  . TONSILLECTOMY    . TUBAL LIGATION  1989  . TYMPANOSTOMY TUBE PLACEMENT Bilateral    "several times when I was a child"  . uterine ablation    . WRIST FRACTURE SURGERY Left 1995    Family Psychiatric History: See below  Family History:  Family History  Problem Relation Age of Onset  . Heart attack Father 50       deceased, etoh use  .  Heart disease Father   . Alcohol abuse Father   . Depression Father   . Heart attack Mother 63       deceased  . Diabetes Mother   . Breast cancer Mother   . Heart failure Mother        oxygen dependence, nonsmoker  . Heart disease Mother   . Depression Mother   . Cancer Mother   . Liver disease Maternal Aunt 71       died while on liver transplant list  . Heart attack Maternal Grandmother        premature CAD  . Ulcers Sister   . Hypertension Sister   . Colon cancer Neg Hx     Social History:  Social History   Socioeconomic History  . Marital status: Married    Spouse name: Not on file  . Number of children: 2  . Years of education: Not on file  . Highest education level: Not on file  Occupational History  . Occupation: unemployed  Social Needs  . Financial resource strain: Not on file  . Food insecurity:    Worry: Not on file    Inability: Not on file  . Transportation needs:    Medical: Not on file    Non-medical: Not on file  Tobacco Use  . Smoking status: Current Every Day Smoker    Packs/day: 1.50    Years: 39.00    Pack years: 58.50    Types: Cigarettes  . Smokeless tobacco: Never Used  Substance and  Sexual Activity  . Alcohol use: Yes    Comment: 10/05/2017 "5 - 8 shots tequilia//month"  . Drug use: Yes    Types: Marijuana    Comment: 10/05/2017 "a few times/wk"  . Sexual activity: Yes    Birth control/protection: Surgical  Lifestyle  . Physical activity:    Days per week: Not on file    Minutes per session: Not on file  . Stress: Not on file  Relationships  . Social connections:    Talks on phone: Not on file    Gets together: Not on file    Attends religious service: Not on file    Active member of club or organization: Not on file    Attends meetings of clubs or organizations: Not on file    Relationship status: Not on file  Other Topics Concern  . Not on file  Social History Narrative  . Not on file    Allergies:  Allergies  Allergen Reactions  . Codeine Itching and Nausea Only  . Metformin And Related Diarrhea and Other (See Comments)    Stomach pain/nausea  . Wellbutrin [Bupropion Hcl] Hives  . Acyclovir And Related Rash    Metabolic Disorder Labs: Lab Results  Component Value Date   HGBA1C 7.8 (H) 02/12/2018   MPG 160 (H) 03/10/2015   MPG 177 (H) 08/31/2014   No results found for: PROLACTIN Lab Results  Component Value Date   CHOL 131 02/12/2018   TRIG 95 02/12/2018   HDL 38 (L) 02/12/2018   CHOLHDL 3.4 02/12/2018   VLDL 35 10/06/2017   LDLCALC 74 02/12/2018   LDLCALC 132 (H) 10/06/2017   Lab Results  Component Value Date   TSH 1.961 10/05/2017   TSH 1.759 09/15/2014    Therapeutic Level Labs: No results found for: LITHIUM No results found for: VALPROATE No components found for:  CBMZ  Current Medications: Current Outpatient Medications  Medication Sig Dispense Refill  . acetaminophen (TYLENOL)  500 MG tablet Take 1,000 mg by mouth every 6 (six) hours as needed for mild pain or headache.    . ALPRAZolam (XANAX) 0.5 MG tablet Take 1 tablet (0.5 mg total) by mouth daily as needed for anxiety. 30 tablet 2  . aspirin EC 81 MG tablet Take 1  tablet (81 mg total) by mouth daily. 90 tablet 3  . canagliflozin (INVOKANA) 300 MG TABS tablet Take 1 tablet (300 mg total) by mouth daily before breakfast. 90 tablet 1  . DULoxetine (CYMBALTA) 60 MG capsule Take 1 capsule (60 mg total) by mouth at bedtime. 30 capsule 2  . fluticasone (FLONASE) 50 MCG/ACT nasal spray 1 spray each nostril after sinus rinses twice daily 16 g 2  . Fluticasone-Salmeterol (ADVAIR DISKUS) 250-50 MCG/DOSE AEPB Inhale 1 puff into the lungs 2 (two) times daily. Flush mouth after each use 1 each 3  . levofloxacin (LEVAQUIN) 750 MG tablet Take 1 tablet (750 mg total) by mouth daily. 7 tablet 0  . liraglutide (VICTOZA) 18 MG/3ML SOPN Inject 0.2 mLs (1.2 mg total) into the skin daily. INJECT 0.6 MG SUBCUTANEOUSLY ONCE DAILY FOR 7 DAYS, THEN 1.2 MG DAILY AS TOLERATED. 9 mL 1  . losartan-hydrochlorothiazide (HYZAAR) 100-12.5 MG tablet TAKE 1 TABLET BY MOUTH DAILY. 60 tablet 0  . meclizine (ANTIVERT) 25 MG tablet Take 1 tablet (25 mg total) by mouth 3 (three) times daily as needed for dizziness or nausea. 30 tablet 0  . pantoprazole (PROTONIX) 40 MG tablet Take 1 tablet (40 mg total) by mouth daily. 30 minutes before a meal. 30 tablet 1  . pregabalin (LYRICA) 75 MG capsule Take 1 capsule (75 mg total) by mouth 3 (three) times daily. 90 capsule 5  . Vitamin D, Ergocalciferol, (DRISDOL) 50000 units CAPS capsule Take 1 capsule (50,000 Units total) by mouth every 7 (seven) days. 12 capsule 10  . amphetamine-dextroamphetamine (ADDERALL) 30 MG tablet Take 1 tablet by mouth 2 (two) times daily. 90 tablet 0  . amphetamine-dextroamphetamine (ADDERALL) 30 MG tablet Take 1 tablet by mouth 2 (two) times daily. Fill after 05/06/2018 60 tablet 0  . amphetamine-dextroamphetamine (ADDERALL) 30 MG tablet Take 1 tablet by mouth 2 (two) times daily. 60 tablet 0  . atorvastatin (LIPITOR) 80 MG tablet Take 1 tablet (80 mg total) by mouth daily. 90 tablet 3  . isosorbide mononitrate (IMDUR) 30 MG 24 hr  tablet Take 1 tablet (30 mg total) by mouth daily. 90 tablet 3   Current Facility-Administered Medications  Medication Dose Route Frequency Provider Last Rate Last Dose  . ipratropium-albuterol (DUONEB) 0.5-2.5 (3) MG/3ML nebulizer solution 3 mL  3 mL Nebulization Q6H Opalski, Aletha Allebach, DO   3 mL at 01/10/18 1042     Musculoskeletal: Strength & Muscle Tone: within normal limits Gait & Station: normal Patient leans: N/A  Psychiatric Specialty Exam: Review of Systems  All other systems reviewed and are negative.   Blood pressure 121/71, pulse 99, height 5\' 4"  (1.626 m), weight 248 lb (112.5 kg), SpO2 95 %.Body mass index is 42.57 kg/m.  General Appearance: Casual and Fairly Groomed  Eye Contact:  Good  Speech:  Clear and Coherent  Volume:  Normal  Mood:  Euthymic  Affect:  Congruent  Thought Process:  Goal Directed  Orientation:  Full (Time, Place, and Person)  Thought Content: WDL   Suicidal Thoughts:  No  Homicidal Thoughts:  No  Memory:  Immediate;   Good Recent;   Good Remote;   Good  Judgement:  Good  Insight:  Fair  Psychomotor Activity:  Normal  Concentration:  Concentration: Fair and Attention Span: Fair  Recall:  Good  Fund of Knowledge: Good  Language: Good  Akathisia:  No  Handed:  Right  AIMS (if indicated): not done  Assets:  Communication Skills Desire for Improvement Resilience Social Support Talents/Skills  ADL's:  Intact  Cognition: WNL  Sleep:  Good   Screenings: AIMS     Admission (Discharged) from 09/12/2014 in [facility]  AIMS Total Score  0    AUDIT     Admission (Discharged) from 09/12/2014 in [facility]  Alcohol Use Disorder Identification Test Final Score (AUDIT)  0    PHQ2-9     Office Visit from 01/10/2018 in [place] at [facility] Visit from 12/07/2017 in [place] at [facility] Visit from 11/12/2017 in [place] at [facility] Visit from 10/01/2017 in [place] at [facility] Total Score  2  2  1  2   PHQ-9 Total Score  7  10  8  10        Assessment and Plan: This patient is a 52 year old female with a history of depression anxiety and ADHD.  Her ADHD medication is not lasting long enough so we will increase Adderall to 30 mg twice daily.  She will continue Cymbalta 60 mg daily for depression and Xanax 0.5 mg daily as needed for anxiety.  She will return to see me in 3 months.   [name], MD 03/08/2018, 11:45 AM

## 2018-03-11 ENCOUNTER — Encounter: Payer: Self-pay | Admitting: Family Medicine

## 2018-03-11 ENCOUNTER — Other Ambulatory Visit: Payer: Self-pay | Admitting: Family Medicine

## 2018-03-11 ENCOUNTER — Ambulatory Visit (INDEPENDENT_AMBULATORY_CARE_PROVIDER_SITE_OTHER): Payer: Medicare HMO | Admitting: Family Medicine

## 2018-03-11 VITALS — BP 123/87 | HR 90 | Temp 98.8°F | Ht 64.0 in | Wt 250.4 lb

## 2018-03-11 DIAGNOSIS — H9201 Otalgia, right ear: Secondary | ICD-10-CM

## 2018-03-11 DIAGNOSIS — F17200 Nicotine dependence, unspecified, uncomplicated: Secondary | ICD-10-CM

## 2018-03-11 DIAGNOSIS — Z23 Encounter for immunization: Secondary | ICD-10-CM

## 2018-03-11 DIAGNOSIS — G8929 Other chronic pain: Secondary | ICD-10-CM

## 2018-03-11 DIAGNOSIS — E119 Type 2 diabetes mellitus without complications: Secondary | ICD-10-CM | POA: Insufficient documentation

## 2018-03-11 DIAGNOSIS — Z716 Tobacco abuse counseling: Secondary | ICD-10-CM

## 2018-03-11 DIAGNOSIS — E1143 Type 2 diabetes mellitus with diabetic autonomic (poly)neuropathy: Secondary | ICD-10-CM | POA: Diagnosis not present

## 2018-03-11 DIAGNOSIS — J449 Chronic obstructive pulmonary disease, unspecified: Secondary | ICD-10-CM

## 2018-03-11 DIAGNOSIS — E1169 Type 2 diabetes mellitus with other specified complication: Secondary | ICD-10-CM | POA: Diagnosis not present

## 2018-03-11 DIAGNOSIS — J329 Chronic sinusitis, unspecified: Secondary | ICD-10-CM

## 2018-03-11 DIAGNOSIS — I10 Essential (primary) hypertension: Secondary | ICD-10-CM

## 2018-03-11 DIAGNOSIS — H7291 Unspecified perforation of tympanic membrane, right ear: Secondary | ICD-10-CM

## 2018-03-11 DIAGNOSIS — E782 Mixed hyperlipidemia: Secondary | ICD-10-CM

## 2018-03-11 DIAGNOSIS — E1159 Type 2 diabetes mellitus with other circulatory complications: Secondary | ICD-10-CM

## 2018-03-11 DIAGNOSIS — E1142 Type 2 diabetes mellitus with diabetic polyneuropathy: Secondary | ICD-10-CM

## 2018-03-11 DIAGNOSIS — J4489 Other specified chronic obstructive pulmonary disease: Secondary | ICD-10-CM

## 2018-03-11 MED ORDER — LOSARTAN POTASSIUM 25 MG PO TABS: 25.0000 mg | ORAL_TABLET | Freq: Every day | ORAL | 0 refills | Status: DC

## 2018-03-11 MED ORDER — LIRAGLUTIDE 18 MG/3ML ~~LOC~~ SOPN: 1.8000 mg | PEN_INJECTOR | Freq: Every day | SUBCUTANEOUS | 1 refills | Status: DC

## 2018-03-11 MED ORDER — TETANUS-DIPHTH-ACELL PERTUSSIS 5-2.5-18.5 LF-MCG/0.5 IM SUSP
0.5000 mL | Freq: Once | INTRAMUSCULAR | Status: AC
  Administered 2018-03-11: 0.5 mL via INTRAMUSCULAR

## 2018-03-11 NOTE — Progress Notes (Signed)
Impression and Recommendations:    1. Type 2 diabetes mellitus with other specified complication, without long-term current use of insulin ([place])   2. Hypertension associated with diabetes ([place])   3. Mixed diabetic hyperlipidemia associated with type 2 diabetes mellitus ([place])   4. Morbid obesity ([place])   5. Controlled type 2 diabetes mellitus with diabetic autonomic neuropathy, without long-term current use of insulin ([place])   6. Perforation of right tympanic membrane   7. Chronic right ear pain   8. Chronic sinusitis, unspecified location   9. Diabetic peripheral neuropathy ([place])   10. Tobacco use disorder-current smoker greater than 40-pack-year history- since age 61 1 ppd   33. Tobacco abuse counseling   12. Flu vaccine need   13. Need for Tdap vaccination   14. COPD with chronic bronchitis (HCC)      Type 2 diabetes mellitus with other specified complication, without long-term current use of insulin ([place]) - Plan:  INCREASE DOE of  liraglutide (VICTOZA) 18 MG/3ML from 1.2 to 1.8mg  QD  Hypertension associated with diabetes ([place]) - Plan: losartan (COZAAR) 25 MG tablet  Controlled type 2 diabetes mellitus with diabetic autonomic neuropathy, without long-term current use of insulin (HCC) - Plan: losartan (COZAAR) 25 MG tablet   Perforation of right tympanic membrane - Plan: Ambulatory referral to ENT Chronic right ear pain - Plan: Ambulatory referral to ENT Chronic sinusitis, unspecified location - Plan: Ambulatory referral to ENT    Tobacco use disorder-current smoker greater than 40-pack-year history- since age 69 1 ppd Tobacco abuse counseling  Flu vaccine need - Plan: Flu Vaccine QUAD 6+ mos PF IM (Fluarix Quad PF)  Need for Tdap vaccination - Plan: Tdap (BOOSTRIX) injection 0.5 mL    DM:  -Her most recent Hgb A1c was at 7.8 on 02/12/2018  -still not at goal of less than 7   -Will increase Victoza to 1.8 mg daily.   Discussed that the goal of this is to ensure  that the Hgb A1c is is below 7.   Advised the patient that if she has any issues with the increased dose of Victoza, then to call into the office and inform us.   -Patient needs yearly diabetic eye exam.  Referral to ophthalmology placed today.  -Discussed with the patient regarding exercise and how to eat better to include protein with every meal as well as really cut back on carbohydrates\sugars    HTN: -Discussed goal blood pressure of 130/80 with the patient today.  Keep an eye on it at home and follow-up sooner than planned if not consistently at goal at home  -Patient is continuing on Hyzar with good tolerance.   -Restart low-dose losartan.     COPD:  -Continue on medications as advised   -Discussed chronic condition with patient and to expect worsening of her productive cough as well as chronic sinusitis etc. as she ages and continues to smoke heavily  -Maintain follow up with pulm as scheduled.     Tobacco Cessation:  Told pt to think seriously about quitting smoking!  Told pt it is very important for his/her health and well being.    Smoking cessation instruction/counseling given:  counseled patient on the dangers of tobacco use, advised patient to stop smoking, and reviewed strategies to maximize success  Discussed with patient that there are multiple treatments to aid in quitting smoking, however I explained none will work unless pt really want to quit  Told to call 1-800-QUIT-NOW 410-873-1479) for free  smoking cessation counseling and support, or pt can go online to www.heart.org - the American Heart Association website and search "quit smoking ".     Obesity and BMI:  Explained to patient what BMI refers to, and what it means medically. Told patient to think about it as a "medical risk stratification measurement" and how increasing BMI is associated with increasing risk/ or worsening state of various diseases such as hypertension, hyperlipidemia, diabetes,  premature OA, depression etc.  Discussed with the patient regarding adding protein to each meal to aid with weight loss. American Heart Association guidelines for healthy diet, basically Mediterranean diet, and exercise guidelines of 30 minutes 5 days per week or more discussed in detail.  Health counseling performed.  All questions answered.    Cough:  -Advised the patient to continue on an expectorant such as Mucinex DM to aid with her cough/chronic chest congestion.  -Recommended that the patient sleeps somewhat propped up at night to prevent the postnasal drip from keeping her coughing.  -Educated patient on chronic productive cough associated with COPD.  Recommend to quit smoking    Right ear drainage:  -Pt will be given a referral to an ENT specialist today for further evaluation of her chronic right ear drainage.     Follow up in 3 months for repeat Hgb A1c and chronic follow up.     Pt was in the office today for 45+ minutes, with over 50% time spent in face to face counseling of patients various medical conditions, treatment plans of those medical conditions including medicine management and lifestyle modification, strategies to improve health and well being; and in coordination of care. SEE ABOVE TREATMENT PLAN FOR DETAILS    Education and routine counseling performed. Handouts provided.  Orders Placed This Encounter  Procedures  . Flu Vaccine QUAD 6+ mos PF IM (Fluarix Quad PF)  . Ambulatory referral to ENT  . Ambulatory referral to Ophthalmology    Medications Discontinued During This Encounter  Medication Reason  . amphetamine-dextroamphetamine (ADDERALL) 30 MG tablet Duplicate  . amphetamine-dextroamphetamine (ADDERALL) 30 MG tablet Duplicate  . levofloxacin (LEVAQUIN) 750 MG tablet Completed Course  . liraglutide (VICTOZA) 18 MG/3ML SOPN       Meds ordered this encounter  Medications  . losartan (COZAAR) 25 MG tablet    Sig: Take 1 tablet (25 mg total)  by mouth daily.    Dispense:  90 tablet    Refill:  0  . liraglutide (VICTOZA) 18 MG/3ML SOPN    Sig: Inject 0.3 mLs (1.8 mg total) into the skin daily.    Dispense:  15 mL    Refill:  1  . Tdap (BOOSTRIX) injection 0.5 mL    The patient was counseled, risk factors were discussed, anticipatory guidance given.  Gross side effects, risk and benefits, and alternatives of medications discussed with patient.  Patient is aware that all medications have potential side effects and we are unable to predict every side effect or drug-drug interaction that may occur.  Expresses verbal understanding and consents to current therapy plan and treatment regimen.   Return for DM, HTN, HLD follow up every 61mo.   Please see AVS handed out to patient at the end of our visit for further patient instructions/ counseling done pertaining to today's office visit.    Note:  This document was prepared using Dragon voice recognition software and may include unintentional dictation errors.   This document serves as a record of services personally performed by Mellody Dance,  DO. It was created on her behalf by Steva Colder, a trained medical scribe. The creation of this record is based on the scribe's personal observations and the provider's statements to them.   I have reviewed the above medical documentation for accuracy and completeness and I concur.  Mellody Dance, DO, D.O. 03/11/2018 6:12 PM        Subjective:    Chief Complaint  Patient presents with  . Follow-up    [name] is a 51 y.o. female who presents to [place] [facility] today for Diabetes Management.  She notes that she hasn't had adequate follow up due to having transportation issues as well as being without a license. She stays at home during the day completing her daily activities. She currently smokes about 1 PPD. This morning for breakfast, she had a couple slices of toast and boiled eggs. For  lunch, she will occasionally not eat foods, however, she notes that she is in the process of consuming fruits and veggies.    Right ear drainage:  She notes that she has had drainage in her right ear and would like a referral to an ENT specialist at this time. She has that she has tubes placed to her ears a lot as a child.    HTN:  She discontinued taking her Losartan for 2 months or so.   She stopped this due to increased dizziness and hypotension.   She was told to follow-up in the office and never did.  She tells me her blood pressures have been at goal whenever she checks it at home   COPD:  Patient is not on Advair at this time.  She recently was evaluated by Pulmonologist, Dr. Elsworth Soho on 11/28/2017.  Apparently she was told to come off the Advair and use albuterol as needed.    It was recommended that they obtain additional pulm testing to determine if long-acting beta agonist needed or what med    DM HPI: -  She has been working on diet and exercise for diabetes  Pt is currently maintained on the following medications for diabetes:   Victoza and Invokana  Medication compliance - She continues on Victoza and Invokona. She has tolerated these medications well.   She has lost weight while on these medications. She was initially 267 lbs in June 2019 and had lost 17 lbs while on this medication.   She doesn't have an endocrinologist.   Home glucose readings range: fasting at 160's-which prior they were to 60s to 280s per patient  Denies polyuria/polydipsia.  Denies hypo/ hyperglycemia symptoms  - She denies new onset of: chest pain, exercise intolerance, shortness of breath, dizziness, visual changes, headache, lower extremity swelling or claudication.   Last diabetic eye exam was No results found for: HMDIABEYEEXA---> referral will be placed today   Foot exam- UTD  Last A1C in the office was:  Lab Results  Component Value Date   HGBA1C 7.8 (H) 02/12/2018   HGBA1C 8.0 (A)  01/10/2018   HGBA1C 10.2 (A) 10/01/2017     Lab Results  Component Value Date   MICROALBUR 10 10/01/2017   [place] 74 02/12/2018   CREATININE 0.62 02/12/2018      Last 3 blood pressure readings in our office are as follows: BP Readings from Last 3 Encounters:  03/11/18 123/87  01/10/18 127/77  12/07/17 133/85    BMI Readings from Last 3 Encounters:  03/11/18 42.98 kg/m  01/10/18 43.10 kg/m  12/07/17  43.31 kg/m     Problem  Diabetes Mellitus, Type II (Hcc)  Diabetic Peripheral Neuropathy (Hcc)  Diabetes Mellitus (Hcc)  Chronic Right Ear Pain  Chronic Sinusitis      Patient Care Team    Relationship Specialty Notifications Start End  Mellody Dance, DO PCP - General Family Medicine  10/05/17   Belva Crome, MD PCP - Cardiology Cardiology Admissions 10/05/17   Danie Binder, MD  Gastroenterology  09/08/10      Patient Active Problem List   Diagnosis Date Noted  . Poorly controlled diabetes mellitus ([place]) 11/12/2017    Priority: High  . Mixed diabetic hyperlipidemia associated with type 2 diabetes mellitus ([place]) 10/01/2017    Priority: High  . Tobacco use disorder-current smoker greater than 40-pack-year history- since age 45 1 ppd 10/01/2017    Priority: High  . Hypertension associated with diabetes ([place]) 12/02/2010    Priority: High  . Diabetes mellitus, type II ([place]) 12/02/2010    Priority: High  . Positive for macroalbuminuria 11/12/2017    Priority: Medium  . COPD with chronic bronchitis ([place]) 10/01/2017    Priority: Medium  . Diabetic peripheral neuropathy ([place]) 10/01/2017    Priority: Medium  . Major depressive disorder, recurrent, severe without psychotic features ([place])     Priority: Medium  . Vitamin D deficiency 07/06/2011    Priority: Medium  . Morbid obesity ([place]) 05/06/2011    Priority: Medium  . H/O drug abuse ([place]) 11/12/2017    Priority: Low  . h/o freq Vaginal yeast infections 11/12/2017    Priority: Low  . Adult attention  deficit hyperactivity disorder 01/14/2016    Priority: Low  . Gastric reflux 10/18/2015    Priority: Low  . Asthmatic bronchitis 06/11/2011    Priority: Low  . Diabetes mellitus ([place]) 03/11/2018  . Chronic right ear pain 03/11/2018  . Chronic sinusitis 03/11/2018  . Atypical chest pain 10/05/2017  . Tobacco abuse counseling 10/01/2017  . Iron deficiency anemia 10/01/2017  . Dyspepsia 09/23/2014  . MDD (major depressive disorder), recurrent episode, severe ([place]) 09/13/2014  . Nasal congestion 05/03/2014  . Chest pain on respiration 03/25/2014  . Leukocytosis 03/25/2014  . Upper airway cough syndrome 02/28/2014  . Abnormal drug screen 04/11/2013  . OSA on CPAP 04/11/2013  . Perforated ear drum 05/22/2012  . Bell's palsy 01/23/2012  . Carpal tunnel syndrome 01/23/2012  . Carpal tunnel syndrome on both sides 01/11/2012  . Pulmonary infiltrates 12/22/2011  . Chronic respiratory failure ([place]) 10/16/2011  . Oxygen dependent 10/16/2011  . Peripheral neuropathy 07/07/2011  . Back pain 06/11/2011  . Depression 05/04/2011  . Gastric erosions 12/05/2010  . Cigarette smoker 12/02/2010  . COPD with acute exacerbation ([place]) 11/30/2010  . Microcytic anemia 09/08/2010  . GERD (gastroesophageal reflux disease) 09/08/2010  . Esophageal dysphagia 09/08/2010     Past Medical History:  Diagnosis Date  . Anemia   . Anxiety   . Asthmatic bronchitis    normal PFT/ seen by pulmonary no evidence of COPD  . Atypical chest pain 10/05/2017  . Chest pain on respiration 03/25/2014  . Chronic bronchitis ([place])   . Chronic lower back pain   . Chronic respiratory failure ([place]) 10/16/2011   Newly 02 dep 24/7 p discharge from [facility] 01/2013  - 03/13/2014  Walked RA  2 laps @ 185 ft each stopped due to  Sob/ aching in legs, thirsty/ no desat @ slow pace   . Chronic respiratory failure with hypoxia (HCC)  On 2-3 L of oxygen at home  . Cigarette smoker 12/02/2010   Followed in Pulmonary clinic/ [facility]/ [place]   - Limits of effective care reviewed 12/22/2011    . COPD (chronic obstructive pulmonary disease) ([place])   . COPD with chronic bronchitis ([place]) 10/01/2017  . Daily headache   . Depression   . Diabetic peripheral neuropathy ([place])   . DM (diabetes mellitus) type II controlled, neurological manifestation ([place]) 12/02/2010  . Gastric erosions    EGD 08/2010.  [name] GERD (gastroesophageal reflux disease)   . H/O drug abuse ([place]) 11/12/2017   -- scanned document from outside source: [facility] in [place] which showed UDS positive for Adderall /amphetamine usage as well as positive urine for THC.  This test result was collected 08/11/2016 and reported 10/07/2016. - lso review of the chart shows another positive amphetamine, THC and METH in the urine back on 10/18/2015 under "care everywhere". ---So   . Heavy menses   . High cholesterol   . History of blood transfusion    "related to low HgB" (10/05/2017)  . History of hiatal hernia   . HTN (hypertension)   . Hyperlipidemia associated with type 2 diabetes mellitus ([place]) 10/18/2015  . Hypertension associated with diabetes ([place]) 12/02/2010   D/c acei 12/22/2011 due to psuedowheeze and narcotic dependent cough> ? Improved - 02/54/2706 started bystolic in place of cozar due to cough    . Increased urinary protein excretion   . Internal hemorrhoids    Colonoscopy 5/12.  . Iron deficiency anemia 10/01/2017  . Mixed diabetic hyperlipidemia associated with type 2 diabetes mellitus ([place]) 10/01/2017  . On home oxygen therapy    "5L at night" (10/05/2017)  . OSA on CPAP   . Osteoarthritis    "back" (10/05/2017)  . Oxygen dependent 10/16/2011  . Pneumonia    "lots of times" (10/05/2017)  . Poorly controlled diabetes mellitus ([place]) 11/12/2017  . Pulmonary infiltrates 12/22/2011   Followed in Pulmonary clinic/ [facility]/ [place]    - See CT Chest 05/05/11   . Shortness of breath   . Tachycardia    never had test  done since no insurance  . Tobacco use disorder-current smoker greater than 40-pack-year history- since age 38 1 ppd 10/01/2017  . Type II diabetes mellitus ([place])      Past Surgical History:  Procedure Laterality Date  . CESAREAN SECTION  1988; 1989  . CHOLECYSTECTOMY OPEN  1990  . COLONOSCOPY  09/16/2010   CBJ:SEGBTD colon/small internal hemorrhoids  . ESOPHAGOGASTRODUODENOSCOPY  09/16/2010   SLF: normal/mild gastritis  . ESOPHAGOGASTRODUODENOSCOPY N/A 10/19/2014   Procedure: ESOPHAGOGASTRODUODENOSCOPY (EGD);  Surgeon: Danie Binder, MD;  Location: [facility] ENDO SUITE;  Service: Endoscopy;  Laterality: N/A;  830  . FRACTURE SURGERY    . HYSTEROSCOPY WITH THERMACHOICE  01/17/2012   Procedure: HYSTEROSCOPY WITH THERMACHOICE;  Surgeon: Florian Buff, MD;  Location: [facility] ORS;  Service: Gynecology;  Laterality: N/A;  total therapy time: 9:13sec  D5W  18 ml in, D5W   68ml out, temperture 87degrees celcious  . KIDNEY SURGERY     as child for blockages  . TONSILLECTOMY    . TUBAL LIGATION  1989  . TYMPANOSTOMY TUBE PLACEMENT Bilateral    "several times when I was a child"  . uterine ablation    . WRIST FRACTURE SURGERY Left 1995     Family History  Problem Relation Age of Onset  . Heart attack Father 23  deceased, etoh use  . Heart disease Father   . Alcohol abuse Father   . Depression Father   . Heart attack Mother 3       deceased  . Diabetes Mother   . Breast cancer Mother   . Heart failure Mother        oxygen dependence, nonsmoker  . Heart disease Mother   . Depression Mother   . Cancer Mother   . Liver disease Maternal Aunt 8       died while on liver transplant list  . Heart attack Maternal Grandmother        premature CAD  . Ulcers Sister   . Hypertension Sister   . Colon cancer Neg Hx      Social History   Substance and Sexual Activity  Drug Use Yes  . Types: Marijuana   Comment: 10/05/2017 "a few times/wk"  ,  Social History   Substance and Sexual  Activity  Alcohol Use Yes   Comment: 10/05/2017 "5 - 8 shots tequilia//month"  ,  Social History   Tobacco Use  Smoking Status Current Every Day Smoker  . Packs/day: 1.50  . Years: 39.00  . Pack years: 58.50  . Types: Cigarettes  Smokeless Tobacco Never Used  ,    Current Outpatient Medications on File Prior to Visit  Medication Sig Dispense Refill  . acetaminophen (TYLENOL) 500 MG tablet Take 1,000 mg by mouth every 6 (six) hours as needed for mild pain or headache.    . ALPRAZolam (XANAX) 0.5 MG tablet Take 1 tablet (0.5 mg total) by mouth daily as needed for anxiety. 30 tablet 2  . amphetamine-dextroamphetamine (ADDERALL) 30 MG tablet Take 1 tablet by mouth 2 (two) times daily. Fill after 05/06/2018 60 tablet 0  . aspirin EC 81 MG tablet Take 1 tablet (81 mg total) by mouth daily. 90 tablet 3  . atorvastatin (LIPITOR) 80 MG tablet Take 1 tablet (80 mg total) by mouth daily. 90 tablet 3  . canagliflozin (INVOKANA) 300 MG TABS tablet Take 1 tablet (300 mg total) by mouth daily before breakfast. 90 tablet 1  . DULoxetine (CYMBALTA) 60 MG capsule Take 1 capsule (60 mg total) by mouth at bedtime. 30 capsule 2  . fluticasone (FLONASE) 50 MCG/ACT nasal spray 1 spray each nostril after sinus rinses twice daily 16 g 2  . isosorbide mononitrate (IMDUR) 30 MG 24 hr tablet Take 1 tablet (30 mg total) by mouth daily. 90 tablet 3  . meclizine (ANTIVERT) 25 MG tablet Take 1 tablet (25 mg total) by mouth 3 (three) times daily as needed for dizziness or nausea. 30 tablet 0  . pantoprazole (PROTONIX) 40 MG tablet Take 1 tablet (40 mg total) by mouth daily. 30 minutes before a meal. 30 tablet 1  . pregabalin (LYRICA) 75 MG capsule Take 1 capsule (75 mg total) by mouth 3 (three) times daily. 90 capsule 5  . Vitamin D, Ergocalciferol, (DRISDOL) 50000 units CAPS capsule Take 1 capsule (50,000 Units total) by mouth every 7 (seven) days. 12 capsule 10  . Fluticasone-Salmeterol (ADVAIR DISKUS) 250-50 MCG/DOSE  AEPB Inhale 1 puff into the lungs 2 (two) times daily. Flush mouth after each use (Patient not taking: Reported on 03/11/2018) 1 each 3  . losartan-hydrochlorothiazide (HYZAAR) 100-12.5 MG tablet TAKE 1 TABLET BY MOUTH DAILY. (Patient not taking: Reported on 03/11/2018) 60 tablet 0   Current Facility-Administered Medications on File Prior to Visit  Medication Dose Route Frequency Provider Last Rate Last Dose  .  ipratropium-albuterol (DUONEB) 0.5-2.5 (3) MG/3ML nebulizer solution 3 mL  3 mL Nebulization Q6H Dwanna Goshert, DO   3 mL at 01/10/18 1042     Allergies  Allergen Reactions  . Codeine Itching and Nausea Only  . Metformin And Related Diarrhea and Other (See Comments)    Stomach pain/nausea  . Wellbutrin [Bupropion Hcl] Hives  . Acyclovir And Related Rash     Review of Systems:   General:  Denies fever, chills Optho/Auditory:   Denies visual changes, blurred vision Respiratory:   Denies SOB, cough, wheeze, DIB  Cardiovascular:   Denies chest pain, palpitations, painful respirations Gastrointestinal:   Denies nausea, vomiting, diarrhea.  Endocrine:     Denies new hot or cold intolerance Musculoskeletal:  Denies joint swelling, gait issues, or new unexplained myalgias/ arthralgias Skin:  Denies rash, suspicious lesions  Neurological:    Denies dizziness, unexplained weakness, numbness  Psychiatric/Behavioral:   Denies mood changes    Objective:     Blood pressure 123/87, pulse 90, temperature 98.8 F (37.1 C), height 5\' 4"  (1.626 m), weight 250 lb 6.4 oz (113.6 kg), SpO2 97 %.  Body mass index is 42.98 kg/m.  General: Well Developed, well nourished, and in no acute distress.  HEENT: Normocephalic, atraumatic, pupils equal round reactive to light, neck supple, No carotid bruits, no JVD. Hole in TM about over 50% that is centrally located and anatomical features are obscured.  Skin: Warm and dry, cap RF less 2 sec Cardiac: Regular rate and rhythm, S1, S2 WNL's, no  murmurs rubs or gallops Respiratory: Not using accessory muscles, speaking in full sentences. rhonchorous breath sounds throughout with an occasional wheeze on end exhalation.  NeuroM-Sk: Ambulates w/o assistance, moves ext * 4 w/o difficulty, sensation grossly intact.  Ext: scant edema b/l lower ext Psych: No HI/SI, judgement and insight good, Euthymic mood. Full Affect.

## 2018-03-11 NOTE — Patient Instructions (Signed)
Please increase your Victoza to 1.8 mg/day up from 1.2.  Also please take your losartan 25 mg tablet daily.  Monitor your blood pressure and if that makes her blood pressure go too low, please take one half tab.  Your goal blood pressure is going to be under 130/80 on a regular basis.   Diabetes Mellitus and Standards of Medical Care  Managing diabetes (diabetes mellitus) can be complicated. Your diabetes treatment may be managed by a team of health care providers, including:  A diet and nutrition specialist (registered dietitian).  A nurse.  A certified diabetes educator (CDE).  A diabetes specialist (endocrinologist).  An eye doctor.  A primary care provider.  A dentist.  Your health care providers follow a schedule in order to help you get the best quality of care. The following schedule is a general guideline for your diabetes management plan. Your health care providers may also give you more specific instructions.  HbA1c (hemoglobin A1c) test This test provides information about blood sugar (glucose) control over the previous 2-3 months. It is used to check whether your diabetes management plan needs to be adjusted.  If you are meeting your treatment goals, this test is done at least 2 times a year.  If you are not meeting treatment goals or if your treatment goals have changed, this test is done 4 times a year.  Blood pressure test  This test is done at every routine medical visit. For most people, the goal is less than 130/80. Ask your health care provider what your goal blood pressure should be.  Dental and eye exams  Visit your dentist two times a year.  If you have type 1 diabetes, get an eye exam 3-5 years after you are diagnosed, and then once a year after your first exam. ? If you were diagnosed with type 1 diabetes as a child, get an eye exam when you are age 32 or older and have had diabetes for 3-5 years. After the first exam, you should get an eye exam once a  year.  If you have type 2 diabetes, have an eye exam as soon as you are diagnosed, and then once a year after your first exam.  Foot care exam  Visual foot exams are done at every routine medical visit. The exams check for cuts, bruises, redness, blisters, sores, or other problems with the feet.  A complete foot exam is done by your health care provider once a year. This exam includes an inspection of the structure and skin of your feet, and a check of the pulses and sensation in your feet. ? Type 1 diabetes: Get your first exam 3-5 years after diagnosis. ? Type 2 diabetes: Get your first exam as soon as you are diagnosed.  Check your feet every day for cuts, bruises, redness, blisters, or sores. If you have any of these or other problems that are not healing, contact your health care provider.  Kidney function test (urine microalbumin)  This test is done once a year. ? Type 1 diabetes: Get your first test 5 years after diagnosis. ? Type 2 diabetes: Get your first test as soon as you are diagnosed._  If you have chronic kidney disease (CKD), get a serum creatinine and estimated glomerular filtration rate (eGFR) test once a year.  Lipid profile (cholesterol, HDL, LDL, triglycerides)  This test should be done when you are diagnosed with diabetes, and every 5 years after the first test. If you are on medicines  to lower your cholesterol, you may need to get this test done every year. ? The goal for LDL is less than 100 mg/dL (5.5 mmol/L). If you are at high risk, the goal is less than 70 mg/dL (3.9 mmol/L). ? The goal for HDL is 40 mg/dL (2.2 mmol/L) for men and 50 mg/dL(2.8 mmol/L) for women. An HDL cholesterol of 60 mg/dL (3.3 mmol/L) or higher gives some protection against heart disease. ? The goal for triglycerides is less than 150 mg/dL (8.3 mmol/L).  Immunizations  The yearly flu (influenza) vaccine is recommended for everyone 6 months or older who has diabetes.  The pneumonia  (pneumococcal) vaccine is recommended for everyone 2 years or older who has diabetes. If you are 69 or older, you may get the pneumonia vaccine as a series of two separate shots.  The hepatitis B vaccine is recommended for adults shortly after they have been diagnosed with diabetes.  The Tdap (tetanus, diphtheria, and pertussis) vaccine should be given: ? According to normal childhood vaccination schedules, for children. ? Every 10 years, for adults who have diabetes.  The shingles vaccine is recommended for people who have had chicken pox and are 50 years or older.  Mental and emotional health  Screening for symptoms of eating disorders, anxiety, and depression is recommended at the time of diagnosis and afterward as needed. If your screening shows that you have symptoms (you have a positive screening result), you may need further evaluation and be referred to a mental health care provider.  Diabetes self-management education  Education about how to manage your diabetes is recommended at diagnosis and ongoing as needed.  Treatment plan  Your treatment plan will be reviewed at every medical visit.  Summary  Managing diabetes (diabetes mellitus) can be complicated. Your diabetes treatment may be managed by a team of health care providers.  Your health care providers follow a schedule in order to help you get the best quality of care.  Standards of care including having regular physical exams, blood tests, blood pressure monitoring, immunizations, screening tests, and education about how to manage your diabetes.  Your health care providers may also give you more specific instructions based on your individual health.      Type 2 Diabetes Mellitus, Self Care, Adult Caring for yourself after you have been diagnosed with type 2 diabetes (type 2 diabetes mellitus) means keeping your blood sugar (glucose) under control with a balance of:  Nutrition.  Exercise.  Lifestyle  changes.  Medicines or insulin, if necessary.  Support from your team of health care providers and others.  The following information explains what you need to know to manage your diabetes at home. What do I need to do to manage my blood glucose?  Check your blood glucose every day, as often as told by your health care provider.  Contact your health care provider if your blood glucose is above your target for 2 tests in a row.  Have your A1c (hemoglobin A1c) level checked at least two times a year, or as often as told by your health care provider. Your health care provider will set individualized treatment goals for you. Generally, the goal of treatment is to maintain the following blood glucose levels:  Before meals (preprandial): 80-130 mg/dL (4.4-7.2 mmol/L).  After meals (postprandial): below 180 mg/dL (10 mmol/L).  A1c level: less than 7%.  What do I need to know about hyperglycemia and hypoglycemia? What is hyperglycemia? Hyperglycemia, also called high blood glucose, occurs when  blood glucose is too high.Make sure you know the early signs of hyperglycemia, such as:  Increased thirst.  Hunger.  Feeling very tired.  Needing to urinate more often than usual.  Blurry vision.  What is hypoglycemia? Hypoglycemia, also called low blood glucose, occurswith a blood glucose level at or below 70 mg/dL (3.9 mmol/L). The risk for hypoglycemia increases during or after exercise, during sleep, during illness, and when skipping meals or not eating for a long time (fasting). It is important to know the symptoms of hypoglycemia and treat it right away. Always have a 15-gram rapid-acting carbohydrate snack with you to treat low blood glucose. Family members and close friends should also know the symptoms and should understand how to treat hypoglycemia, in case you are not able to treat yourself. What are the symptoms of hypoglycemia? Hypoglycemia symptoms can  include:  Hunger.  Anxiety.  Sweating and feeling clammy.  Confusion.  Dizziness or feeling light-headed.  Sleepiness.  Nausea.  Increased heart rate.  Headache.  Blurry vision.  Seizure.  Nightmares.  Tingling or numbness around the mouth, lips, or tongue.  A change in speech.  Decreased ability to concentrate.  A change in coordination.  Restless sleep.  Tremors or shakes.  Fainting.  Irritability.  How do I treat hypoglycemia?  If you are alert and able to swallow safely, follow the 15:15 rule:  Take 15 grams of a rapid-acting carbohydrate. Rapid-acting options include: ? 1 tube of glucose gel. ? 3 glucose pills. ? 6-8 pieces of hard candy. ? 4 oz (120 mL) of fruit juice. ? 4 oz (120 mL) of regular (not diet) soda.  Check your blood glucose 15 minutes after you take the carbohydrate.  If the repeat blood glucose level is still at or below 70 mg/dL (3.9 mmol/L), take 15 grams of a carbohydrate again.  If your blood glucose level does not increase above 70 mg/dL (3.9 mmol/L) after 3 tries, seek emergency medical care.  After your blood glucose level returns to normal, eat a meal or a snack within 1 hour.  How do I treat severe hypoglycemia? Severe hypoglycemia is when your blood glucose level is at or below 54 mg/dL (3 mmol/L). Severe hypoglycemia is an emergency. Do not wait to see if the symptoms will go away. Get medical help right away. Call your local emergency services (911 in the [place] Do not drive yourself to the hospital. If you have severe hypoglycemia and you cannot eat or drink, you may need an injection of glucagon. A family member or close friend should learn how to check your blood glucose and how to give you a glucagon injection. Ask your health care provider if you need to have an emergency glucagon injection kit available. Severe hypoglycemia may need to be treated in a hospital. The treatment may include getting glucose through an  IV tube. You may also need treatment for the cause of your hypoglycemia. Can having diabetes put me at risk for other conditions? Having diabetes can put you at risk for other long-term (chronic) conditions, such as heart disease and kidney disease. Your health care provider may prescribe medicines to help prevent complications from diabetes. These medicines may include:  Aspirin.  Medicine to lower cholesterol.  Medicine to control blood pressure.  What else can I do to manage my diabetes? Take your diabetes medicines as told  If your health care provider prescribed insulin or diabetes medicines, take them every day.  Do not run out of insulin  or other diabetes medicines that you take. Plan ahead so you always have these available.  If you use insulin, adjust your dosage based on how physically active you are and what foods you eat. Your health care provider will tell you how to adjust your dosage. Make healthy food choices  The things that you eat and drink affect your blood glucose and your insulin dosage. Making good choices helps to control your diabetes and prevent other health problems. A healthy meal plan includes eating lean proteins, complex carbohydrates, fresh fruits and vegetables, low-fat dairy products, and healthy fats. Make an appointment to see a diet and nutrition specialist (registered dietitian) to help you create an eating plan that is right for you. Make sure that you:  Follow instructions from your health care provider about eating or drinking restrictions.  Drink enough fluid to keep your urine clear or pale yellow.  Eat healthy snacks between nutritious meals.  Track the carbohydrates that you eat. Do this by reading food labels and learning the standard serving sizes of foods.  Follow your sick day plan whenever you cannot eat or drink as usual. Make this plan in advance with your health care provider.  Stay active  Exercise regularly, as told by your  health care provider. This may include:  Stretching and doing strength exercises, such as yoga or weightlifting, at least 2 times a week.  Doing at least 150 minutes of moderate-intensity or vigorous-intensity exercise each week. This could be brisk walking, biking, or water aerobics. ? Spread out your activity over at least 3 days of the week. ? Do not go more than 2 days in a row without doing some kind of physical activity.  When you start a new exercise or activity, work with your health care provider to adjust your insulin, medicines, or food intake as needed. Make healthy lifestyle choices  Do not use any tobacco products, such as cigarettes, chewing tobacco, and e-cigarettes. If you need help quitting, ask your health care provider.  If your health care provider says that alcohol is safe for you, limit alcohol intake to no more than 1 drink per day for nonpregnant women and 2 drinks per day for men. One drink equals 12 oz of beer, 5 oz of wine, or 1 oz of hard liquor.  Learn to manage stress. If you need help with this, ask your health care provider. Care for your body   Keep your immunizations up to date. In addition to getting vaccinations as told by your health care provider, it is recommended that you get vaccinated against the following illnesses: ? The flu (influenza). Get a flu shot every year. ? Pneumonia. ? Hepatitis B.  Schedule an eye exam soon after your diagnosis, and then one time every year after that.  Check your skin and feet every day for cuts, bruises, redness, blisters, or sores. Schedule a foot exam with your health care provider once every year.  Brush your teeth and gums two times a day, and floss at least one time a day. Visit your dentist at least once every 6 months.  Maintain a healthy weight. General instructions  Take over-the-counter and prescription medicines only as told by your health care provider.  Share your diabetes management plan with  people in your workplace, school, and household.  Check your urine for ketones when you are ill and as told by your health care provider.  Ask your health care provider: ? Do I need to meet with  a diabetes educator? ? Where can I find a support group for people with diabetes?  Carry a medical alert card or wear medical alert jewelry.  Keep all follow-up visits as told by your health care provider. This is important. Where to find more information: For more information about diabetes, visit:  American Diabetes Association (ADA): www.diabetes.org  American Association of Diabetes Educators (AADE): www.diabeteseducator.org/patient-resources  This information is not intended to replace advice given to you by your health care provider. Make sure you discuss any questions you have with your health care provider. Document Released: 08/02/2015 Document Revised: 09/16/2015 Document Reviewed: 05/14/2015 Elsevier Interactive Patient Education  2017 [place].      Blood Glucose Monitoring, Adult Monitoring your blood sugar (glucose) helps you manage your diabetes. It also helps you and your health care provider determine how well your diabetes management plan is working. Blood glucose monitoring involves checking your blood glucose as often as directed, and keeping a record (log) of your results over time. Why should I monitor my blood glucose? Checking your blood glucose regularly can:  Help you understand how food, exercise, illnesses, and medicines affect your blood glucose.  Let you know what your blood glucose is at any time. You can quickly tell if you are having low blood glucose (hypoglycemia) or high blood glucose (hyperglycemia).  Help you and your health care provider adjust your medicines as needed.  When should I check my blood glucose? Follow instructions from your health care provider about how often to check your blood glucose.   This may depend on:  The type of  diabetes you have.  How well-controlled your diabetes is.  Medicines you are taking.  If you have type 1 diabetes:  Check your blood glucose at least 2 times a day.  Also check your blood glucose: ? Before every insulin injection. ? Before and after exercise. ? Between meals. ? 2 hours after a meal. ? Occasionally between 2:00 a.m. and 3:00 a.m., as directed. ? Before potentially dangerous tasks, like driving or using heavy machinery. ? At bedtime.  You may need to check your blood glucose more often, up to 6-10 times a day: ? If you use an insulin pump. ? If you need multiple daily injections (MDI). ? If your diabetes is not well-controlled. ? If you are ill. ? If you have a history of severe hypoglycemia. ? If you have a history of not knowing when your blood glucose is getting low (hypoglycemia unawareness).  If you have type 2 diabetes:  If you take insulin or other diabetes medicines, check your blood glucose at least 2 times a day.  If you are on intensive insulin therapy, check your blood glucose at least 4 times a day. Occasionally, you may also need to check between 2:00 a.m. and 3:00 a.m., as directed.  Also check your blood glucose: ? Before and after exercise. ? Before potentially dangerous tasks, like driving or using heavy machinery.  You may need to check your blood glucose more often if: ? Your medicine is being adjusted. ? Your diabetes is not well-controlled. ? You are ill.  What is a blood glucose log?  A blood glucose log is a record of your blood glucose readings. It helps you and your health care provider: ? Look for patterns in your blood glucose over time. ? Adjust your diabetes management plan as needed.  Every time you check your blood glucose, write down your result and notes about things that may  be affecting your blood glucose, such as your diet and exercise for the day.  Most glucose meters store a record of glucose readings in the  meter. Some meters allow you to download your records to a computer. How do I check my blood glucose? Follow these steps to get accurate readings of your blood glucose: Supplies needed   Blood glucose meter.  Test strips for your meter. Each meter has its own strips. You must use the strips that come with your meter.  A needle to prick your finger (lancet). Do not use lancets more than once.  A device that holds the lancet (lancing device).  A journal or log book to write down your results.  Procedure  Wash your hands with soap and water.  Prick the side of your finger (not the tip) with the lancet. Use a different finger each time.  Gently rub the finger until a small drop of blood appears.  Follow instructions that come with your meter for inserting the test strip, applying blood to the strip, and using your blood glucose meter.  Write down your result and any notes.  Alternative testing sites  Some meters allow you to use areas of your body other than your finger (alternative sites) to test your blood.  If you think you may have hypoglycemia, or if you have hypoglycemia unawareness, do not use alternative sites. Use your finger instead.  Alternative sites may not be as accurate as the fingers, because blood flow is slower in these areas. This means that the result you get may be delayed, and it may be different from the result that you would get from your finger.  The most common alternative sites are: ? Forearm. ? Thigh. ? Palm of the hand.  Additional tips  Always keep your supplies with you.  If you have questions or need help, all blood glucose meters have a 24-hour "hotline" number that you can call. You may also contact your health care provider.  After you use a few boxes of test strips, adjust (calibrate) your blood glucose meter by following instructions that came with your meter.    The American Diabetes Association suggests the following targets for  most nonpregnant adults with diabetes.  More or less stringent glycemic goals may be appropriate for each individual.  A1C: Less than 7% A1C may also be reported as eAG: Less than 154 mg/dl Before a meal (preprandial plasma glucose): 80-130 mg/dl 1-2 hours after beginning of the meal (Postprandial plasma glucose)*: Less than 180 mg/dl  *Postprandial glucose may be targeted if A1C goals are not met despite reaching preprandial glucose goals.   GOALS in short:  The goals are for the Hgb A1C to be less than 7.0 & blood pressure to be less than 130/80.    It is recommended that all diabetics are educated on and follow a healthy diabetic diet, exercise for 30 minutes 3-4 times per week (walking, biking, swimming, or machine), monitor blood glucose readings and bring that record with you to be reviewed at your next office visit.     You should be checking fasting blood sugars- especially after you eat poorly or eat really healthy, and also check 2 hour postprandial blood sugars after largest meal of the day.    Write these down and bring in your log at each office visit.    You will need to be seen every 3 months by the provider managing your Diabetes unless told otherwise by that provider.  You will need yearly eye exams from an eye specialist and foot exams to check the nerves of your feet.  Also, your urine should be checked yearly as well to make sure excess protein is not present.   If you are checking your blood pressure at home, please record it and bring it to your next office visit.    Follow the Dietary Approaches to Stop Hypertension (DASH) diet (3 servings of fruit and vegetables daily, whole grains, low sodium, low-fat proteins).  See below.    Lastly, when it comes to your cholesterol, the goal is to have the HDL (good cholesterol) >40, and the LDL (bad cholesterol) <100.   It is recommended that you follow a heart healthy, low saturated and trans-fat diet and exercise for 30 minutes  at least 5 times a week.     (( Check out the DASH diet = 1.5 Gram Low Sodium Diet   A 1.5 gram sodium diet restricts the amount of sodium in the diet to no more than 1.5 g or 1500 mg daily.  The American Heart Association recommends Americans over the age of 78 to consume no more than 1500 mg of sodium each day to reduce the risk of developing high blood pressure.  Research also shows that limiting sodium may reduce heart attack and stroke risk.  Many foods contain sodium for flavor and sometimes as a preservative.  When the amount of sodium in a diet needs to be low, it is important to know what to look for when choosing foods and drinks.  The following includes some information and guidelines to help make it easier for you to adapt to a low sodium diet.    QUICK TIPS  Do not add salt to food.  Avoid convenience items and fast food.  Choose unsalted snack foods.  Buy lower sodium products, often labeled as "lower sodium" or "no salt added."  Check food labels to learn how much sodium is in 1 serving.  When eating at a restaurant, ask that your food be prepared with less salt or none, if possible.    READING FOOD LABELS FOR SODIUM INFORMATION  The nutrition facts label is a good place to find how much sodium is in foods. Look for products with no more than 400 mg of sodium per serving.  Remember that 1.5 g = 1500 mg.  The food label may also list foods as:  Sodium-free: Less than 5 mg in a serving.  Very low sodium: 35 mg or less in a serving.  Low-sodium: 140 mg or less in a serving.  Light in sodium: 50% less sodium in a serving. For example, if a food that usually has 300 mg of sodium is changed to become light in sodium, it will have 150 mg of sodium.  Reduced sodium: 25% less sodium in a serving. For example, if a food that usually has 400 mg of sodium is changed to reduced sodium, it will have 300 mg of sodium.    CHOOSING FOODS  Grains  Avoid: Salted crackers and snack items.  Some cereals, including instant hot cereals. Bread stuffing and biscuit mixes. Seasoned rice or pasta mixes.  Choose: Unsalted snack items. Low-sodium cereals, oats, puffed wheat and rice, shredded wheat. English muffins and bread. Pasta.  Meats  Avoid: Salted, canned, smoked, spiced, pickled meats, including fish and poultry. Bacon, ham, sausage, cold cuts, hot dogs, anchovies.  Choose: Low-sodium canned tuna and salmon. Fresh or frozen meat, poultry, and fish.  Dairy  Avoid: Processed cheese and spreads. Cottage cheese. Buttermilk and condensed milk. Regular cheese.  Choose: Milk. Low-sodium cottage cheese. Yogurt. Sour cream. Low-sodium cheese.  Fruits and Vegetables  Avoid: Regular canned vegetables. Regular canned tomato sauce and paste. Frozen vegetables in sauces. Olives. Angie Fava. Relishes. Sauerkraut.  Choose: Low-sodium canned vegetables. Low-sodium tomato sauce and paste. Frozen or fresh vegetables. Fresh and frozen fruit.  Condiments  Avoid: Canned and packaged gravies. Worcestershire sauce. Tartar sauce. Barbecue sauce. Soy sauce. Steak sauce. Ketchup. Onion, garlic, and table salt. Meat flavorings and tenderizers.  Choose: Fresh and dried herbs and spices. Low-sodium varieties of mustard and ketchup. Lemon juice. Tabasco sauce. Horseradish.    SAMPLE 1.5 GRAM SODIUM MEAL PLAN:   Breakfast / Sodium (mg)  1 cup low-fat milk / 143 mg  1 whole-wheat English muffin / 240 mg  1 tbs heart-healthy margarine / 153 mg  1 hard-boiled egg / 139 mg  1 small orange / 0 mg  Lunch / Sodium (mg)  1 cup raw carrots / 76 mg  2 tbs no salt added peanut butter / 5 mg  2 slices whole-wheat bread / 270 mg  1 tbs jelly / 6 mg   cup red grapes / 2 mg  Dinner / Sodium (mg)  1 cup whole-wheat pasta / 2 mg  1 cup low-sodium tomato sauce / 73 mg  3 oz lean ground beef / 57 mg  1 small side salad (1 cup raw spinach leaves,  cup cucumber,  cup yellow bell pepper) with 1 tsp olive oil and 1 tsp red  wine vinegar / 25 mg  Snack / Sodium (mg)  1 container low-fat vanilla yogurt / 107 mg  3 graham cracker squares / 127 mg  Nutrient Analysis  Calories: 1745  Protein: 75 g  Carbohydrate: 237 g  Fat: 57 g  Sodium: 1425 mg  Document Released: 04/10/2005 Document Revised: 12/21/2010 Document Reviewed: 07/12/2009  ExitCare Patient Information 2012 [place].))    This information is not intended to replace advice given to you by your health care provider. Make sure you discuss any questions you have with your health care provider. Document Released: 04/13/2003 Document Revised: 10/29/2015 Document Reviewed: 09/20/2015 Elsevier Interactive Patient Education  2017 [employer].

## 2018-04-05 ENCOUNTER — Encounter: Payer: Self-pay | Admitting: Family Medicine

## 2018-04-05 ENCOUNTER — Other Ambulatory Visit: Payer: Self-pay

## 2018-04-05 DIAGNOSIS — R1310 Dysphagia, unspecified: Secondary | ICD-10-CM

## 2018-04-05 DIAGNOSIS — R1319 Other dysphagia: Secondary | ICD-10-CM

## 2018-04-05 DIAGNOSIS — K219 Gastro-esophageal reflux disease without esophagitis: Secondary | ICD-10-CM

## 2018-04-05 NOTE — Telephone Encounter (Signed)
Patient sent a MyChart message requesting a refill on Protonix.  Medication was last filled by pervious provider. Sent to Dr. Raliegh Scarlet to review and advise.  LOV 03/11/2018. MPulliam, CMA/RT(R)

## 2018-04-08 MED ORDER — OMEPRAZOLE 20 MG PO CPDR: 20.0000 mg | DELAYED_RELEASE_CAPSULE | Freq: Every day | ORAL | 0 refills | Status: DC

## 2018-04-08 NOTE — Telephone Encounter (Signed)
This dose is only indicated in certain GI conditions, and at this higher dose, pt will need to obtain from pt's GI doc.

## 2018-04-08 NOTE — Addendum Note (Signed)
Addended by: Lanier Prude D on: 04/08/2018 04:17 PM   Modules accepted: Orders

## 2018-04-08 NOTE — Telephone Encounter (Signed)
Sent in per note and patient notified. MPulliam, CMA/RT(R)

## 2018-04-08 NOTE — Telephone Encounter (Signed)
Patient sent back MyChat response.  Patient states that she has been on this medicine for 4 or 5 years first prescribed by Dr. Oneida Alar in [place], she was my gastroenterologist. She prescribed me the medicine for gastritis and acid reflux.  Patient no longer sees them and has not in 5 year.  Patient was then getting refills from Dr. Colin Ina at [facility] [place]. She was taking 40 mg twice a day but then  started taking it once a day when I moved to local area to make it last longer because she didn't have a regular doctor until she started seeing Dr. Raliegh Scarlet. She states that she has done fine with just taking it once a day. Please review and advise. MPulliam, CMA/RT(R)

## 2018-04-08 NOTE — Telephone Encounter (Signed)
Based on that history, I recommend:   Protonix 20 mg 1 p.o. 30 minutes prior to largest meal of the day Dispense 90, no refill  -She will need a follow-up office visit to see how she is doing on this medicine.  Please inform patient that there are some potentially long-term detrimental effects from being on a PPI chronically and she should familiarize herself with these potential side effects and consequences

## 2018-05-13 ENCOUNTER — Other Ambulatory Visit: Payer: Self-pay | Admitting: Family Medicine

## 2018-05-13 ENCOUNTER — Other Ambulatory Visit: Payer: Self-pay

## 2018-05-13 DIAGNOSIS — E1142 Type 2 diabetes mellitus with diabetic polyneuropathy: Secondary | ICD-10-CM

## 2018-05-13 MED ORDER — LYRICA 75 MG PO CAPS: 75.0000 mg | ORAL_CAPSULE | Freq: Three times a day (TID) | ORAL | 5 refills | Status: DC

## 2018-05-13 MED ORDER — PREGABALIN 75 MG PO CAPS: 75.0000 mg | ORAL_CAPSULE | Freq: Three times a day (TID) | ORAL | 0 refills | Status: DC

## 2018-05-13 NOTE — Telephone Encounter (Signed)
Pharmacy sent refill request for Lyrcia Last filled 11/12/2017 for 30 days 5 refills. LOV 03/11/18.  [place] controlled substance data base reviewed no aberrancies noted.  Please review and advise. MPulliam, CMA/RT(R)

## 2018-05-13 NOTE — Telephone Encounter (Signed)
Future refills to come from Dr. Raliegh Scarlet

## 2018-05-13 NOTE — Progress Notes (Signed)
Error. MPulliam, CMA/RT(R)  

## 2018-06-10 ENCOUNTER — Other Ambulatory Visit: Payer: Self-pay | Admitting: Family Medicine

## 2018-06-10 ENCOUNTER — Ambulatory Visit (HOSPITAL_COMMUNITY): Payer: Medicare HMO | Admitting: Psychiatry

## 2018-06-10 ENCOUNTER — Encounter (HOSPITAL_COMMUNITY): Payer: Self-pay | Admitting: Psychiatry

## 2018-06-10 VITALS — BP 128/84 | HR 95 | Ht 64.0 in | Wt 250.0 lb

## 2018-06-10 DIAGNOSIS — F901 Attention-deficit hyperactivity disorder, predominantly hyperactive type: Secondary | ICD-10-CM | POA: Diagnosis not present

## 2018-06-10 DIAGNOSIS — I10 Essential (primary) hypertension: Secondary | ICD-10-CM

## 2018-06-10 DIAGNOSIS — E1143 Type 2 diabetes mellitus with diabetic autonomic (poly)neuropathy: Secondary | ICD-10-CM

## 2018-06-10 DIAGNOSIS — F331 Major depressive disorder, recurrent, moderate: Secondary | ICD-10-CM

## 2018-06-10 DIAGNOSIS — E1159 Type 2 diabetes mellitus with other circulatory complications: Secondary | ICD-10-CM

## 2018-06-10 MED ORDER — DULOXETINE HCL 60 MG PO CPEP: 60.0000 mg | ORAL_CAPSULE | Freq: Every day | ORAL | 2 refills | Status: DC

## 2018-06-10 MED ORDER — AMPHETAMINE-DEXTROAMPHETAMINE 30 MG PO TABS: 30.0000 mg | ORAL_TABLET | Freq: Two times a day (BID) | ORAL | 0 refills | Status: DC

## 2018-06-10 MED ORDER — ALPRAZOLAM 0.5 MG PO TABS: 0.5000 mg | ORAL_TABLET | Freq: Every day | ORAL | 2 refills | Status: DC | PRN

## 2018-06-10 NOTE — Progress Notes (Signed)
BH MD/PA/NP OP Progress Note  06/10/2018 4:44 PM [name]  MRN:  [id number]  Chief Complaint:  Chief Complaint    ADHD; Anxiety; Depression; Follow-up     HPI: This patient is a 53 year old married [place] and wants to resume care here. She is currently on disability.  The patient was last seen here on 05/07/2015. At that time she was being treated for depression anxiety and ADHD. [place] Her primary physician there continued her medications which included Cymbalta and Adderall and occasional Xanax.  The patient moved back to this area last September. Her daughter was having difficulties. Her daughter was developing severe hydradenitis suprativaand was also pregnant. She and her boyfriend came back here to support the daughter. Since then she is been hospitalized twice for pneumonia. She is gotten off all of her medications for diabetes hypertension cholesterol etc. but has establish care with a primary care physician in the [facility] system. Last month she was restarted on all of her medications. However she is off all of her psychiatric medicines.  The patient states that without the Cymbalta she is somewhat depressed and is also experiencing more neuropathy in her feet. She is off the Adderall and cannot stay focused at all. She denies being seriously depressed or suicidal. Her sleep is fairly good most of the time but the neuropathy keeps her up at night. She is very concerned about her daughter who is now suffering from postpartum depression and is not getting adequate help. The patient states that she could use Xanax but only very periodically and I think this is reasonable.  The patient returns after 3 months.  She has been under a lot of stress  lately.  Her sister who is 95 years old died suddenly in May 27, 2022.  They were very close and this is been difficult for her.  She states that she has been sleeping more and just feels down a lot of the time.  She is trying to get out more and do things and is looking for a bigger house because both her daughter and her husband's daughter are going to need places to live.  Overall she thinks she is holding up fairly well. Visit Diagnosis:    ICD-10-CM   1. Attention deficit hyperactivity disorder (ADHD), predominantly hyperactive type F90.1   2. Moderate episode of recurrent major depressive disorder ([place]) F33.1     Past Psychiatric History: 2 prior psychiatric hospitalizations in her 20s.  Her last hospitalization was in 2006 for drug overdose.  Since then she has received outpatient treatment  Past Medical History:  Past Medical History:  Diagnosis Date  . Anemia   . Anxiety   . Asthmatic bronchitis    normal PFT/ seen by pulmonary no evidence of COPD  . Atypical chest pain 10/05/2017  . Chest pain on respiration 03/25/2014  . Chronic bronchitis ([place])   . Chronic lower back pain   . Chronic respiratory failure ([place]) 10/16/2011   Newly 02 dep 24/7 p discharge from [facility] 01/2013  - 03/13/2014  Walked RA  2 laps @ 185 ft each stopped due to  Sob/ aching in legs, thirsty/ no desat @ slow pace   . Chronic respiratory failure with hypoxia (HCC)    On 2-3 L of oxygen at  home  . Cigarette smoker 12/02/2010   Followed in Pulmonary clinic/ [facility]/ [place]   - Limits of effective care reviewed 12/22/2011    . COPD (chronic obstructive pulmonary disease) ([place])   . COPD with chronic bronchitis ([place]) 10/01/2017  . Daily headache   . Depression   . Diabetic peripheral neuropathy ([place])   . DM (diabetes mellitus) type II controlled, neurological manifestation ([place]) 12/02/2010  . Gastric erosions    EGD 08/2010.  [name] GERD (gastroesophageal reflux disease)   . H/O drug abuse ([place]) 11/12/2017   --  scanned document from outside source: [facility] in [place] which showed UDS positive for Adderall /amphetamine usage as well as positive urine for THC.  This test result was collected 08/11/2016 and reported 10/07/2016. - lso review of the chart shows another positive amphetamine, THC and METH in the urine back on 10/18/2015 under "care everywhere". ---So   . Heavy menses   . High cholesterol   . History of blood transfusion    "related to low HgB" (10/05/2017)  . History of hiatal hernia   . HTN (hypertension)   . Hyperlipidemia associated with type 2 diabetes mellitus ([place]) 10/18/2015  . Hypertension associated with diabetes ([place]) 12/02/2010   D/c acei 12/22/2011 due to psuedowheeze and narcotic dependent cough> ? Improved - 15/17/6160 started bystolic in place of cozar due to cough    . Increased urinary protein excretion   . Internal hemorrhoids    Colonoscopy 5/12.  . Iron deficiency anemia 10/01/2017  . Mixed diabetic hyperlipidemia associated with type 2 diabetes mellitus ([place]) 10/01/2017  . On home oxygen therapy    "5L at night" (10/05/2017)  . OSA on CPAP   . Osteoarthritis    "back" (10/05/2017)  . Oxygen dependent 10/16/2011  . Pneumonia    "lots of times" (10/05/2017)  . Poorly controlled diabetes mellitus ([place]) 11/12/2017  . Pulmonary infiltrates 12/22/2011   Followed in Pulmonary clinic/ [facility]/ [place]    - See CT Chest 05/05/11   . Shortness of breath   . Tachycardia    never had test done since no insurance  . Tobacco use disorder-current smoker greater than 40-pack-year history- since age 58 1 ppd 10/01/2017  . Type II diabetes mellitus ([place])     Past Surgical History:  Procedure Laterality Date  . CESAREAN SECTION  1988; 1989  . CHOLECYSTECTOMY OPEN  1990  . COLONOSCOPY  09/16/2010   VPX:TGGYIR colon/small internal hemorrhoids  . ESOPHAGOGASTRODUODENOSCOPY  09/16/2010   SLF: normal/mild gastritis  . ESOPHAGOGASTRODUODENOSCOPY  N/A 10/19/2014   Procedure: ESOPHAGOGASTRODUODENOSCOPY (EGD);  Surgeon: Danie Binder, MD;  Location: [facility];  Service: Endoscopy;  Laterality: N/A;  830  . FRACTURE SURGERY    . HYSTEROSCOPY WITH THERMACHOICE  01/17/2012   Procedure: HYSTEROSCOPY WITH THERMACHOICE;  Surgeon: Florian Buff, MD;  Location: [facility] ORS;  Service: Gynecology;  Laterality: N/A;  total therapy time: 9:13sec  D5W  18 ml in, D5W   50ml out, temperture 87degrees celcious  . KIDNEY SURGERY     as child for blockages  . TONSILLECTOMY    . TUBAL LIGATION  1989  . TYMPANOSTOMY TUBE PLACEMENT Bilateral    "several times when I was a child"  . uterine ablation    . WRIST FRACTURE SURGERY Left 1995    Family Psychiatric History: see below  Family History:  Family History  Problem Relation Age of Onset  . Heart attack Father  52       deceased, etoh use  . Heart disease Father   . Alcohol abuse Father   . Depression Father   . Heart attack Mother 42       deceased  . Diabetes Mother   . Breast cancer Mother   . Heart failure Mother        oxygen dependence, nonsmoker  . Heart disease Mother   . Depression Mother   . Cancer Mother   . Liver disease Maternal Aunt 46       died while on liver transplant list  . Heart attack Maternal Grandmother        premature CAD  . Ulcers Sister   . Hypertension Sister   . Colon cancer Neg Hx     Social History:  Social History   Socioeconomic History  . Marital status: Married    Spouse name: Not on file  . Number of children: 2  . Years of education: Not on file  . Highest education level: Not on file  Occupational History  . Occupation: unemployed  Social Needs  . Financial resource strain: Not on file  . Food insecurity:    Worry: Not on file    Inability: Not on file  . Transportation needs:    Medical: Not on file    Non-medical: Not on file  Tobacco Use  . Smoking status: Current Every Day Smoker    Packs/day: 1.50    Years: 39.00    Pack  years: 58.50    Types: Cigarettes  . Smokeless tobacco: Never Used  Substance and Sexual Activity  . Alcohol use: Yes    Comment: 10/05/2017 "5 - 8 shots tequilia//month"  . Drug use: Yes    Types: Marijuana    Comment: 10/05/2017 "a few times/wk"  . Sexual activity: Yes    Birth control/protection: Surgical  Lifestyle  . Physical activity:    Days per week: Not on file    Minutes per session: Not on file  . Stress: Not on file  Relationships  . Social connections:    Talks on phone: Not on file    Gets together: Not on file    Attends religious service: Not on file    Active member of club or organization: Not on file    Attends meetings of clubs or organizations: Not on file    Relationship status: Not on file  Other Topics Concern  . Not on file  Social History Narrative  . Not on file    Allergies:  Allergies  Allergen Reactions  . Codeine Itching and Nausea Only  . Metformin And Related Diarrhea and Other (See Comments)    Stomach pain/nausea  . Wellbutrin [Bupropion Hcl] Hives  . Acyclovir And Related Rash    Metabolic Disorder Labs: Lab Results  Component Value Date   HGBA1C 7.8 (H) 02/12/2018   MPG 160 (H) 03/10/2015   MPG 177 (H) 08/31/2014   No results found for: PROLACTIN Lab Results  Component Value Date   CHOL 131 02/12/2018   TRIG 95 02/12/2018   HDL 38 (L) 02/12/2018   CHOLHDL 3.4 02/12/2018   VLDL 35 10/06/2017   LDLCALC 74 02/12/2018   LDLCALC 132 (H) 10/06/2017   Lab Results  Component Value Date   TSH 1.961 10/05/2017   TSH 1.759 09/15/2014    Therapeutic Level Labs: No results found for: LITHIUM No results found for: VALPROATE No components found for:  CBMZ  Current Medications:  Current Outpatient Medications  Medication Sig Dispense Refill  . acetaminophen (TYLENOL) 500 MG tablet Take 1,000 mg by mouth every 6 (six) hours as needed for mild pain or headache.    . ALPRAZolam (XANAX) 0.5 MG tablet Take 1 tablet (0.5 mg total)  by mouth daily as needed for anxiety. 30 tablet 2  . amphetamine-dextroamphetamine (ADDERALL) 30 MG tablet Take 1 tablet by mouth 2 (two) times daily. 60 tablet 0  . aspirin EC 81 MG tablet Take 1 tablet (81 mg total) by mouth daily. 90 tablet 3  . atorvastatin (LIPITOR) 80 MG tablet Take 1 tablet (80 mg total) by mouth daily. 90 tablet 3  . canagliflozin (INVOKANA) 300 MG TABS tablet Take 1 tablet (300 mg total) by mouth daily before breakfast. 90 tablet 1  . DULoxetine (CYMBALTA) 60 MG capsule Take 1 capsule (60 mg total) by mouth at bedtime. 30 capsule 2  . fluconazole (DIFLUCAN) 150 MG tablet TAKE 1 TABLET BY MOUTH AFTER 5 DAYS OF ANTIBIOTICS THEN REPEAT WEEKLY FOR 2 DOSES 3 tablet 1  . fluticasone (FLONASE) 50 MCG/ACT nasal spray 1 spray each nostril after sinus rinses twice daily 16 g 2  . Fluticasone-Salmeterol (ADVAIR DISKUS) 250-50 MCG/DOSE AEPB Inhale 1 puff into the lungs 2 (two) times daily. Flush mouth after each use 1 each 3  . isosorbide mononitrate (IMDUR) 30 MG 24 hr tablet Take 1 tablet (30 mg total) by mouth daily. 90 tablet 3  . liraglutide (VICTOZA) 18 MG/3ML SOPN Inject 0.3 mLs (1.8 mg total) into the skin daily. 15 mL 1  . losartan (COZAAR) 25 MG tablet Take 1 tablet (25 mg total) by mouth daily. 90 tablet 0  . losartan-hydrochlorothiazide (HYZAAR) 100-12.5 MG tablet TAKE 1 TABLET BY MOUTH DAILY. 60 tablet 0  . meclizine (ANTIVERT) 25 MG tablet Take 1 tablet (25 mg total) by mouth 3 (three) times daily as needed for dizziness or nausea. 30 tablet 0  . omeprazole (PRILOSEC) 20 MG capsule Take 1 capsule (20 mg total) by mouth daily. Following largest meal. 90 capsule 0  . pantoprazole (PROTONIX) 40 MG tablet Take 1 tablet (40 mg total) by mouth daily. 30 minutes before a meal. 30 tablet 1  . pregabalin (LYRICA) 75 MG capsule Take 1 capsule (75 mg total) by mouth 3 (three) times daily. 90 capsule 0  . Vitamin D, Ergocalciferol, (DRISDOL) 50000 units CAPS capsule Take 1 capsule  (50,000 Units total) by mouth every 7 (seven) days. 12 capsule 10  . amphetamine-dextroamphetamine (ADDERALL) 30 MG tablet Take 1 tablet by mouth 2 (two) times daily. 60 tablet 0   Current Facility-Administered Medications  Medication Dose Route Frequency Provider Last Rate Last Dose  . ipratropium-albuterol (DUONEB) 0.5-2.5 (3) MG/3ML nebulizer solution 3 mL  3 mL Nebulization Q6H Opalski, Deborah, DO   3 mL at 01/10/18 1042     Musculoskeletal: Strength & Muscle Tone: within normal limits Gait & Station: normal Patient leans: N/A  Psychiatric Specialty Exam: Review of Systems  Respiratory: Positive for shortness of breath.   Neurological: Positive for tingling.  All other systems reviewed and are negative.   Blood pressure 128/84, pulse 95, height 5\' 4"  (1.626 m), weight 250 lb (113.4 kg), SpO2 95 %.Body mass index is 42.91 kg/m.  General Appearance: Casual and Fairly Groomed  Eye Contact:  Good  Speech:  Clear and Coherent  Volume:  Normal  Mood:  Euthymic  Affect:  Congruent  Thought Process:  Goal Directed  Orientation:  Full (Time,  Place, and Person)  Thought Content: Rumination   Suicidal Thoughts:  No  Homicidal Thoughts:  No  Memory:  Immediate;   Good Recent;   Good Remote;   Fair  Judgement:  Fair  Insight:  Fair  Psychomotor Activity:  Decreased  Concentration:  Concentration: Good and Attention Span: Good  Recall:  Good  Fund of Knowledge: Fair  Language: Good  Akathisia:  No  Handed:  Right  AIMS (if indicated): not done  Assets:  Communication Skills Desire for Improvement Resilience Social Support Talents/Skills  ADL's:  Intact  Cognition: WNL  Sleep:  Good   Screenings: AIMS     Admission (Discharged) from 09/12/2014 in [facility]  AIMS Total Score  0    AUDIT     Admission (Discharged) from 09/12/2014 in [facility]  Alcohol Use Disorder Identification Test Final Score  (AUDIT)  0    PHQ2-9     Office Visit from 03/11/2018 in [place] at [facility] Visit from 01/10/2018 in [place] at [facility] Visit from 12/07/2017 in [place] at [facility] Visit from 11/12/2017 in [place] at [facility] Visit from 10/01/2017 in [place] at [facility] Total Score  0  2  2  1  2   PHQ-9 Total Score  3  7  10  8  10        Assessment and Plan:  This patient is a 53 year old female with a history of depression anxiety and ADD.  Despite her sister's recent death she is feels like she is doing fairly well.  She will continue Cymbalta 60 mg daily for depression, Xanax 0.5 mg daily as needed for anxiety and Adderall 30 mg twice daily for ADD.  She will return to see me in 2 months  [name], MD 06/10/2018, 4:44 PM

## 2018-07-08 ENCOUNTER — Other Ambulatory Visit: Payer: Self-pay | Admitting: Family Medicine

## 2018-07-08 ENCOUNTER — Other Ambulatory Visit: Payer: Self-pay | Admitting: Adult Health

## 2018-07-08 DIAGNOSIS — R1310 Dysphagia, unspecified: Secondary | ICD-10-CM

## 2018-07-08 DIAGNOSIS — K219 Gastro-esophageal reflux disease without esophagitis: Secondary | ICD-10-CM

## 2018-07-08 DIAGNOSIS — E1142 Type 2 diabetes mellitus with diabetic polyneuropathy: Secondary | ICD-10-CM

## 2018-07-08 DIAGNOSIS — R1319 Other dysphagia: Secondary | ICD-10-CM

## 2018-07-08 NOTE — Progress Notes (Signed)
Subjective:    Patient ID: [name], female    DOB: 05/21/1965, 53 y.o.   MRN: [id number]  HPI:  [name] presents with productive cough (thick/yellow mucus), clear nasal drainage, frontal HA (constant, throbbing 10/10), sinus pressure, persistent fatigue, low grade fever (highest 99.36f oral) and significant shortness of breath that began 2-3 weeks ago. She also reports nausea without vomiting, and diarrhea the last 2 days with a normal BM this morning. She denies abdominal pain She denies hematuria/hematochezia She denies known exposure to Influenza or COVID-19 She has recently moved from [place] to [place] in the last few weeks, denies any other travel. She has been using OTC Mucinex and her inhalers with only minimal sx relief She continues to smoke 1/2- 1 pack/day She pre-existing COPD, chronic respiratory failure, and re-current bronchitis  She lives with 8 other people, youngest is 53 years old She does not work   Patient Care Team    Relationship Specialty Notifications Start End  Mellody Dance, DO PCP - General Family Medicine  10/05/17   Belva Crome, MD PCP - Cardiology Cardiology Admissions 10/05/17   Danie Binder, MD  Gastroenterology  09/08/10     Patient Active Problem List   Diagnosis Date Noted  . SOB (shortness of breath) 07/09/2018  . Fever, low grade 07/09/2018  . Cough in adult 07/09/2018  . Diabetes mellitus ([place]) 03/11/2018  . Chronic right ear pain 03/11/2018  . Chronic sinusitis 03/11/2018  . H/O drug abuse ([place]) 11/12/2017  . Positive for macroalbuminuria 11/12/2017  . h/o freq Vaginal yeast infections 11/12/2017  . Poorly controlled diabetes mellitus ([place]) 11/12/2017  . Atypical chest pain 10/05/2017  . COPD with chronic bronchitis ([place]) 10/01/2017  . Mixed diabetic hyperlipidemia associated with type 2 diabetes mellitus ([place]) 10/01/2017  . Tobacco use disorder-current smoker greater than 40-pack-year history-  since age 78 1 ppd 10/01/2017  . Tobacco abuse counseling 10/01/2017  . Diabetic peripheral neuropathy ([place]) 10/01/2017  . Iron deficiency anemia 10/01/2017  . Adult attention deficit hyperactivity disorder 01/14/2016  . Gastric reflux 10/18/2015  . Dyspepsia 09/23/2014  . Major depressive disorder, recurrent, severe without psychotic features ([place])   . MDD (major depressive disorder), recurrent episode, severe ([place]) 09/13/2014  . Nasal congestion 05/03/2014  . Chest pain on respiration 03/25/2014  . Leukocytosis 03/25/2014  . Upper airway cough syndrome 02/28/2014  . Abnormal drug screen 04/11/2013  . OSA on CPAP 04/11/2013  . Perforated ear drum 05/22/2012  . Bell's palsy 01/23/2012  . Carpal tunnel syndrome 01/23/2012  . Carpal tunnel syndrome on both sides 01/11/2012  . Pulmonary infiltrates 12/22/2011  . Chronic respiratory failure ([place]) 10/16/2011  . Oxygen dependent 10/16/2011  . Peripheral neuropathy 07/07/2011  . Vitamin D deficiency 07/06/2011  . Asthmatic bronchitis 06/11/2011  . Back pain 06/11/2011  . Morbid obesity ([place]) 05/06/2011  . Depression 05/04/2011  . Gastric erosions 12/05/2010  . Hypertension associated with diabetes ([place]) 12/02/2010  . Diabetes mellitus, type II ([place]) 12/02/2010  . Cigarette smoker 12/02/2010  . COPD with acute exacerbation ([place]) 11/30/2010  . Microcytic anemia 09/08/2010  . GERD (gastroesophageal reflux disease) 09/08/2010  . Esophageal dysphagia 09/08/2010     Past Medical History:  Diagnosis Date  . Anemia   . Anxiety   . Asthmatic bronchitis    normal PFT/ seen by pulmonary no evidence of COPD  . Atypical chest pain 10/05/2017  . Chest pain on respiration 03/25/2014  . Chronic bronchitis ([place])   .  Chronic lower back pain   . Chronic respiratory failure ([place]) 10/16/2011   Newly 02 dep 24/7 p discharge [facility] 01/2013  - 03/13/2014  Walked RA  2 laps @ 185 ft each stopped due to  Sob/ aching in legs, thirsty/ no desat @ slow  pace   . Chronic respiratory failure with hypoxia (HCC)    On 2-3 L of oxygen at home  . Cigarette smoker 12/02/2010   Followed in Pulmonary clinic/ [facility]/ [place]   - Limits of effective care reviewed 12/22/2011    . COPD (chronic obstructive pulmonary disease) ([place])   . COPD with chronic bronchitis ([place]) 10/01/2017  . Daily headache   . Depression   . Diabetic peripheral neuropathy ([place])   . DM (diabetes mellitus) type II controlled, neurological manifestation ([place]) 12/02/2010  . Gastric erosions    EGD 08/2010.  [name] GERD (gastroesophageal reflux disease)   . H/O drug abuse ([place]) 11/12/2017   -- scanned document from outside source: [facility] in [place] which showed UDS positive for Adderall /amphetamine usage as well as positive urine for THC.  This test result was collected 08/11/2016 and reported 10/07/2016. - lso review of the chart shows another positive amphetamine, THC and METH in the urine back on 10/18/2015 under "care everywhere". ---So   . Heavy menses   . High cholesterol   . History of blood transfusion    "related to low HgB" (10/05/2017)  . History of hiatal hernia   . HTN (hypertension)   . Hyperlipidemia associated with type 2 diabetes mellitus ([place]) 10/18/2015  . Hypertension associated with diabetes ([place]) 12/02/2010   D/c acei 12/22/2011 due to psuedowheeze and narcotic dependent cough> ? Improved - 56/81/2751 started bystolic in place of cozar due to cough    . Increased urinary protein excretion   . Internal hemorrhoids    Colonoscopy 5/12.  . Iron deficiency anemia 10/01/2017  . Mixed diabetic hyperlipidemia associated with type 2 diabetes mellitus ([place]) 10/01/2017  . On home oxygen therapy    "5L at night" (10/05/2017)  . OSA on CPAP   . Osteoarthritis    "back" (10/05/2017)  . Oxygen dependent 10/16/2011  . Pneumonia    "lots of times" (10/05/2017)  . Poorly controlled diabetes mellitus ([place]) 11/12/2017  . Pulmonary  infiltrates 12/22/2011   Followed in Pulmonary clinic/ [facility]/ [place]    - See CT Chest 05/05/11   . Shortness of breath   . Tachycardia    never had test done since no insurance  . Tobacco use disorder-current smoker greater than 40-pack-year history- since age 35 1 ppd 10/01/2017  . Type II diabetes mellitus ([place])      Past Surgical History:  Procedure Laterality Date  . CESAREAN SECTION  1988; 1989  . CHOLECYSTECTOMY OPEN  1990  . COLONOSCOPY  09/16/2010   ZGY:FVCBSW colon/small internal hemorrhoids  . ESOPHAGOGASTRODUODENOSCOPY  09/16/2010   SLF: normal/mild gastritis  . ESOPHAGOGASTRODUODENOSCOPY N/A 10/19/2014   Procedure: ESOPHAGOGASTRODUODENOSCOPY (EGD);  Surgeon: Danie Binder, MD;  Location: [facility] ENDO SUITE;  Service: Endoscopy;  Laterality: N/A;  830  . FRACTURE SURGERY    . HYSTEROSCOPY WITH THERMACHOICE  01/17/2012   Procedure: HYSTEROSCOPY WITH THERMACHOICE;  Surgeon: Florian Buff, MD;  Location: [facility] ORS;  Service: Gynecology;  Laterality: N/A;  total therapy time: 9:13sec  D5W  18 ml in, D5W   64ml out, temperture 87degrees celcious  . KIDNEY SURGERY  as child for blockages  . TONSILLECTOMY    . TUBAL LIGATION  1989  . TYMPANOSTOMY TUBE PLACEMENT Bilateral    "several times when I was a child"  . uterine ablation    . WRIST FRACTURE SURGERY Left 1995     Family History  Problem Relation Age of Onset  . Heart attack Father 27       deceased, etoh use  . Heart disease Father   . Alcohol abuse Father   . Depression Father   . Heart attack Mother 110       deceased  . Diabetes Mother   . Breast cancer Mother   . Heart failure Mother        oxygen dependence, nonsmoker  . Heart disease Mother   . Depression Mother   . Cancer Mother   . Liver disease Maternal Aunt 59       died while on liver transplant list  . Heart attack Maternal Grandmother        premature CAD  . Ulcers Sister   . Hypertension Sister   . Colon cancer Neg Hx      Social  History   Substance and Sexual Activity  Drug Use Yes  . Types: Marijuana   Comment: 10/05/2017 "a few times/wk"     Social History   Substance and Sexual Activity  Alcohol Use Yes   Comment: 10/05/2017 "5 - 8 shots tequilia//month"     Social History   Tobacco Use  Smoking Status Current Every Day Smoker  . Packs/day: 1.00  . Years: 39.00  . Pack years: 39.00  . Types: Cigarettes  Smokeless Tobacco Never Used     Outpatient Encounter Medications as of 07/09/2018  Medication Sig Note  . acetaminophen (TYLENOL) 500 MG tablet Take 1,000 mg by mouth every 6 (six) hours as needed for mild pain or headache.   . ALPRAZolam (XANAX) 0.5 MG tablet Take 1 tablet (0.5 mg total) by mouth daily as needed for anxiety.   [name] amphetamine-dextroamphetamine (ADDERALL) 30 MG tablet Take 1 tablet by mouth 2 (two) times daily.   [name] aspirin EC 81 MG tablet Take 1 tablet (81 mg total) by mouth daily.   [name] atorvastatin (LIPITOR) 80 MG tablet Take 1 tablet (80 mg total) by mouth daily. 12/07/2017: Has just been changed today from 40 mg - patient has not started yet  . canagliflozin (INVOKANA) 300 MG TABS tablet Take 1 tablet (300 mg total) by mouth daily before breakfast.   . DULoxetine (CYMBALTA) 60 MG capsule Take 1 capsule (60 mg total) by mouth at bedtime.   . fluticasone (FLONASE) 50 MCG/ACT nasal spray USE 1 SPRAY IN EACH NOSTRIL TWICE DAILY AFTER SINUS RINSES   . Fluticasone-Salmeterol (ADVAIR DISKUS) 250-50 MCG/DOSE AEPB Inhale 1 puff into the lungs 2 (two) times daily. Flush mouth after each use   . isosorbide mononitrate (IMDUR) 30 MG 24 hr tablet Take 1 tablet (30 mg total) by mouth daily.   [name] liraglutide (VICTOZA) 18 MG/3ML SOPN Inject 0.3 mLs (1.8 mg total) into the skin daily.   [name] losartan (COZAAR) 25 MG tablet TAKE 1 TABLET (25 MG TOTAL) BY MOUTH DAILY.   [name] losartan-hydrochlorothiazide (HYZAAR) 100-12.5 MG tablet TAKE 1 TABLET BY MOUTH DAILY.   . meclizine (ANTIVERT) 25 MG tablet Take 1  tablet (25 mg total) by mouth 3 (three) times daily as needed for dizziness or nausea.   [name] omeprazole (PRILOSEC) 20 MG capsule TAKE 1 CAPSULE BY MOUTH  DAILY FOLLOWING LARGEST MEAL.   . pantoprazole (PROTONIX) 40 MG tablet Take 1 tablet (40 mg total) by mouth daily. 30 minutes before a meal.   . pregabalin (LYRICA) 75 MG capsule Take 1 capsule (75 mg total) by mouth 3 (three) times daily.   . Vitamin D, Ergocalciferol, (DRISDOL) 50000 units CAPS capsule Take 1 capsule (50,000 Units total) by mouth every 7 (seven) days.   . [DISCONTINUED] amphetamine-dextroamphetamine (ADDERALL) 30 MG tablet Take 1 tablet by mouth 2 (two) times daily.   . [DISCONTINUED] fluconazole (DIFLUCAN) 150 MG tablet TAKE 1 TABLET BY MOUTH AFTER 5 DAYS OF ANTIBIOTICS THEN REPEAT WEEKLY FOR 2 DOSES   . [DISCONTINUED] fluticasone (FLONASE) 50 MCG/ACT nasal spray USE 1 SPRAY IN EACH NOSTRIL TWICE DAILY AFTER SINUS RINSES   . [DISCONTINUED] Fluticasone-Salmeterol (ADVAIR DISKUS) 250-50 MCG/DOSE AEPB Inhale 1 puff into the lungs 2 (two) times daily. Flush mouth after each use   . azithromycin (ZITHROMAX) 250 MG tablet 2 tabs day one, then one tab days 2-5   . predniSONE (DELTASONE) 20 MG tablet 2 tabs daily=40mg /day, take for 5 days    Facility-Administered Encounter Medications as of 07/09/2018  Medication  . ipratropium-albuterol (DUONEB) 0.5-2.5 (3) MG/3ML nebulizer solution 3 mL    Allergies: Codeine; Metformin and related; Wellbutrin [bupropion hcl]; and Acyclovir and related  Body mass index is 42.74 kg/m.  Blood pressure (!) 147/88, pulse 99, temperature 98.8 F (37.1 C), temperature source Oral, height 5\' 4"  (1.626 m), weight 249 lb (112.9 kg), SpO2 95 %.  Review of Systems  Constitutional: Positive for activity change, appetite change, chills, diaphoresis, fatigue and fever. Negative for unexpected weight change.  HENT: Positive for congestion, postnasal drip, rhinorrhea, sinus pressure, sinus pain, sneezing,  tinnitus and voice change. Negative for trouble swallowing.   Respiratory: Positive for cough, chest tightness, shortness of breath and wheezing.   Cardiovascular: Negative for chest pain, palpitations and leg swelling.  Gastrointestinal: Positive for diarrhea and nausea. Negative for abdominal distention, abdominal pain, blood in stool, constipation and vomiting.  Genitourinary: Negative for difficulty urinating and flank pain.  Musculoskeletal: Positive for arthralgias, back pain, gait problem and myalgias.  Neurological: Positive for headaches. Negative for dizziness.  Hematological: Does not bruise/bleed easily.  Psychiatric/Behavioral: Positive for sleep disturbance.       Objective:   Physical Exam Vitals signs and nursing note reviewed.  Constitutional:      Appearance: She is obese. She is ill-appearing.  HENT:     Head: Normocephalic and atraumatic.     Right Ear: No decreased hearing noted. Tympanic membrane is bulging. Tympanic membrane is not erythematous.     Left Ear: No decreased hearing noted. Tympanic membrane is bulging. Tympanic membrane is not erythematous.     Nose: Mucosal edema, congestion and rhinorrhea present.     Right Turbinates: Swollen.     Left Turbinates: Swollen.     Right Sinus: No maxillary sinus tenderness or frontal sinus tenderness.     Left Sinus: No maxillary sinus tenderness or frontal sinus tenderness.     Mouth/Throat:     Pharynx: Posterior oropharyngeal erythema present. No oropharyngeal exudate.     Tonsils: No tonsillar exudate. Swelling: 1+ on the right. 1+ on the left.  Cardiovascular:     Rate and Rhythm: Normal rate.     Pulses: Normal pulses.     Heart sounds: Normal heart sounds. No murmur. No friction rub. No gallop.   Pulmonary:     Effort: Pulmonary effort is  normal.     Breath sounds: Examination of the right-upper field reveals wheezing. Examination of the left-upper field reveals wheezing. Examination of the right-middle  field reveals wheezing. Examination of the left-middle field reveals wheezing. Examination of the right-lower field reveals decreased breath sounds. Examination of the left-lower field reveals decreased breath sounds. Decreased breath sounds and wheezing present. No rhonchi or rales.  Chest:     Chest wall: No tenderness.  Skin:    General: Skin is warm.     Capillary Refill: Capillary refill takes less than 2 seconds.  Neurological:     Mental Status: She is alert and oriented to person, place, and time.  Psychiatric:        Mood and Affect: Mood normal.        Behavior: Behavior normal.        Thought Content: Thought content normal.        Judgment: Judgment normal.       Assessment & Plan:   1. SOB (shortness of breath)   2. Fever, low grade   3. Cough in adult   4. COPD with chronic bronchitis (HCC)     Fever, low grade Since your Flu Test is Negative and you are experiencing low grade fever with cough and significant shortness of breath- Proceed to drive up [place] immediately-order has been placed. Keep your mask on and do not go anywhere but directly to [place].  SOB (shortness of breath) Since your Flu Test is Negative and you are experiencing low grade fever with cough and significant shortness of breath- Proceed to drive up [place] immediately-order has been placed. Keep your mask on and do not go anywhere but directly to [place].  Cough in adult Consulted with Dr. Madilyn Fireman and she advised okay to treat while awaiting COVID-19 test results Sent in Azithromycin, Prednisone burst, and refilled inhalers  Pt called and updated.    FOLLOW-UP:  Return if symptoms worsen or fail to improve.

## 2018-07-09 ENCOUNTER — Encounter: Payer: Self-pay | Admitting: Adult Health

## 2018-07-09 ENCOUNTER — Ambulatory Visit (INDEPENDENT_AMBULATORY_CARE_PROVIDER_SITE_OTHER): Payer: Medicare HMO | Admitting: Adult Health

## 2018-07-09 ENCOUNTER — Other Ambulatory Visit: Payer: Self-pay

## 2018-07-09 VITALS — BP 147/88 | HR 99 | Temp 98.8°F | Ht 64.0 in | Wt 249.0 lb

## 2018-07-09 DIAGNOSIS — R059 Cough, unspecified: Secondary | ICD-10-CM | POA: Insufficient documentation

## 2018-07-09 DIAGNOSIS — R0602 Shortness of breath: Secondary | ICD-10-CM | POA: Insufficient documentation

## 2018-07-09 DIAGNOSIS — J449 Chronic obstructive pulmonary disease, unspecified: Secondary | ICD-10-CM | POA: Diagnosis not present

## 2018-07-09 DIAGNOSIS — R05 Cough: Secondary | ICD-10-CM | POA: Insufficient documentation

## 2018-07-09 DIAGNOSIS — R509 Fever, unspecified: Secondary | ICD-10-CM | POA: Diagnosis not present

## 2018-07-09 MED ORDER — PREDNISONE 20 MG PO TABS: ORAL_TABLET | ORAL | 0 refills | Status: DC

## 2018-07-09 MED ORDER — FLUTICASONE PROPIONATE 50 MCG/ACT NA SUSP: NASAL | 2 refills | Status: DC

## 2018-07-09 MED ORDER — FLUTICASONE-SALMETEROL 250-50 MCG/DOSE IN AEPB: 1.0000 | INHALATION_SPRAY | Freq: Two times a day (BID) | RESPIRATORY_TRACT | 1 refills | Status: DC

## 2018-07-09 MED ORDER — AZITHROMYCIN 250 MG PO TABS: ORAL_TABLET | ORAL | 0 refills | Status: DC

## 2018-07-09 NOTE — Assessment & Plan Note (Signed)
Since your Flu Test is Negative and you are experiencing low grade fever with cough and significant shortness of breath- Proceed to drive up [place] immediately-order has been placed. Keep your mask on and do not go anywhere but directly to [place].

## 2018-07-09 NOTE — Assessment & Plan Note (Addendum)
Consulted with Dr. Madilyn Fireman and she advised okay to treat while awaiting COVID-19 test results Sent in Azithromycin, Prednisone burst, and refilled inhalers  Pt called and updated.

## 2018-07-09 NOTE — Telephone Encounter (Signed)
Med last filled 05/13/18 #90 no refills (30 day supply).  Patient last seen for chronic care on 03/11/2018.  Controlled substance database reviewed no aberrancies noted. MPulliam, CMA/RT(R)

## 2018-07-09 NOTE — Assessment & Plan Note (Signed)
Since your Flu Test is Negative and you are experiencing low grade fever with cough and significant shortness of breath- Proceed to drive up [place] immediately-order has been placed. Keep your mask on and do not go anywhere but directly to [place].

## 2018-07-09 NOTE — Patient Instructions (Signed)
Coronavirus (COVID-19) Are you at risk?  Are you at risk for the Coronavirus (COVID-19)?  To be considered HIGH RISK for Coronavirus (COVID-19), you have to meet the following criteria:  . Traveled to [place], [place], [place], [place] or [place]; or in the [place] to [place], [place], [place], or [place]; and have fever, cough, and shortness of breath within the last 2 weeks of travel OR . Been in close contact with a person diagnosed with COVID-19 within the last 2 weeks and have fever, cough, and shortness of breath . IF YOU DO NOT MEET THESE CRITERIA, YOU ARE CONSIDERED LOW RISK FOR COVID-19.  What to do if you are HIGH RISK for COVID-19?  . If you are having a medical emergency, call 911. . Seek medical care right away. Before you go to a doctor's office, urgent care or emergency department, call ahead and tell them about your recent travel, contact with someone diagnosed with COVID-19, and your symptoms. You should receive instructions from your physician's office regarding next steps of care.  . When you arrive at healthcare provider, tell the healthcare staff immediately you have returned from visiting [place], [place], [place], [place] or [place]; or traveled in the [place] to [place], [place], [place], or [place]; in the last two weeks or you have been in close contact with a person diagnosed with COVID-19 in the last 2 weeks.   . Tell the health care staff about your symptoms: fever, cough and shortness of breath. . After you have been seen by a medical provider, you will be either: o Tested for (COVID-19) and discharged home on quarantine except to seek medical care if symptoms worsen, and asked to  - Stay home and avoid contact with others until you get your results (4-5 days)  - Avoid travel on public transportation if possible (such as bus, train, or airplane) or o Sent to the Emergency Department by EMS for evaluation, COVID-19 testing, and possible  admission depending on your condition and test results.  What to do if you are LOW RISK for COVID-19?  Reduce your risk of any infection by using the same precautions used for avoiding the common cold or flu:  . Wash your hands often with soap and warm water for at least 20 seconds.  If soap and water are not readily available, use an alcohol-based hand sanitizer with at least 60% alcohol.  . If coughing or sneezing, cover your mouth and nose by coughing or sneezing into the elbow areas of your shirt or coat, into a tissue or into your sleeve (not your hands). . Avoid shaking hands with others and consider head nods or verbal greetings only. . Avoid touching your eyes, nose, or mouth with unwashed hands.  . Avoid close contact with people who are sick. . Avoid places or events with large numbers of people in one location, like concerts or sporting events. . Carefully consider travel plans you have or are making. . If you are planning any travel outside or inside the [place], visit the CDC's Travelers' Health webpage for the latest health notices. . If you have some symptoms but not all symptoms, continue to monitor at home and seek medical attention if your symptoms worsen. . If you are having a medical emergency, call 911.   ADDITIONAL HEALTHCARE OPTIONS FOR PATIENTS  [facility] / e-Visit: [email or web address]         [facility]: [phone]  [facility]: [phone]                   [facility]: [phone]   Since your Flu Test is Negative and you are experiencing low grade fever with cough and significant shortness of breath- Proceed to drive up [place] immediately-order has been placed. Keep your mask on and do not go anywhere but directly to [place].

## 2018-07-14 ENCOUNTER — Encounter: Payer: Self-pay | Admitting: Family Medicine

## 2018-07-15 ENCOUNTER — Other Ambulatory Visit: Payer: Self-pay | Admitting: Adult Health

## 2018-07-15 ENCOUNTER — Telehealth: Payer: Self-pay

## 2018-07-15 MED ORDER — PREDNISONE 20 MG PO TABS: ORAL_TABLET | ORAL | 0 refills | Status: DC

## 2018-07-15 NOTE — Telephone Encounter (Signed)
Good Afternoon Tonya, Can you please call Ms. [name]-Moller and share- I sent in 4 day course of Prednisone Still no results on COVID swab Please tell her to remain well hydrated, eat healthy, rest Thanks! Valetta Fuller

## 2018-07-15 NOTE — Telephone Encounter (Signed)
Pt informed.  Pt expressed understanding and is agreeable.  T. Nelson, CMA  

## 2018-07-15 NOTE — Telephone Encounter (Signed)
Spoke with pt and advised her that COVID-19 test is still pending.  Pt states that she is continuing to self-isolate as much as possible, however she lives with 8 other people, but she is not leaving her house.  Pt states that she has finished ABX and steroids and was feeling better while she was on these medications.  However, since completing medications, she has begun to have increased respiratory congestion.  Pt would like another RX for steroids.  Please advise.  Charyl Bigger, CMA

## 2018-07-24 LAB — NOVEL CORONAVIRUS, NAA: SARS-CoV-2, NAA: NOT DETECTED

## 2018-08-12 ENCOUNTER — Telehealth (HOSPITAL_COMMUNITY): Payer: Self-pay | Admitting: *Deleted

## 2018-08-12 ENCOUNTER — Other Ambulatory Visit (HOSPITAL_COMMUNITY): Payer: Self-pay | Admitting: Psychiatry

## 2018-08-12 ENCOUNTER — Ambulatory Visit (HOSPITAL_COMMUNITY): Payer: Medicare HMO | Admitting: Psychiatry

## 2018-08-12 MED ORDER — AMPHETAMINE-DEXTROAMPHETAMINE 30 MG PO TABS: 30.0000 mg | ORAL_TABLET | Freq: Two times a day (BID) | ORAL | 0 refills | Status: DC

## 2018-08-12 NOTE — Telephone Encounter (Signed)
sent 

## 2018-08-12 NOTE — Telephone Encounter (Signed)
Dr Harrington Challenger Due to appointment rescheduled patient stated that @ this time she will need refill on Adderall. Next visit 08-19-2018

## 2018-08-19 ENCOUNTER — Ambulatory Visit (HOSPITAL_COMMUNITY): Payer: Medicare HMO | Admitting: Psychiatry

## 2018-08-19 ENCOUNTER — Other Ambulatory Visit: Payer: Self-pay

## 2018-09-09 ENCOUNTER — Other Ambulatory Visit: Payer: Self-pay | Admitting: Family Medicine

## 2018-09-09 ENCOUNTER — Other Ambulatory Visit: Payer: Self-pay | Admitting: Adult Health

## 2018-09-09 ENCOUNTER — Other Ambulatory Visit (HOSPITAL_COMMUNITY): Payer: Self-pay | Admitting: Psychiatry

## 2018-09-09 DIAGNOSIS — E1143 Type 2 diabetes mellitus with diabetic autonomic (poly)neuropathy: Secondary | ICD-10-CM

## 2018-09-09 DIAGNOSIS — E1159 Type 2 diabetes mellitus with other circulatory complications: Secondary | ICD-10-CM

## 2018-09-09 DIAGNOSIS — E1129 Type 2 diabetes mellitus with other diabetic kidney complication: Secondary | ICD-10-CM

## 2018-09-09 DIAGNOSIS — E1142 Type 2 diabetes mellitus with diabetic polyneuropathy: Secondary | ICD-10-CM

## 2018-09-09 NOTE — Telephone Encounter (Signed)
LOV for chronic 03/11/2018  Patient is over due for 3 month follow up.  Sent message to the front desk to call to set up an appointment for the patient.  Medication was last filled 05/13/18.  Please review and advise if 30 days approved until patient follows up. MPulliam, CMA/RT(R)

## 2018-09-10 ENCOUNTER — Other Ambulatory Visit: Payer: Self-pay | Admitting: Family Medicine

## 2018-09-10 DIAGNOSIS — E1169 Type 2 diabetes mellitus with other specified complication: Secondary | ICD-10-CM

## 2018-09-21 ENCOUNTER — Emergency Department (HOSPITAL_COMMUNITY)
Admission: EM | Admit: 2018-09-21 | Discharge: 2018-09-21 | Disposition: A | Discharge status: Home / Self Care | Payer: Medicare HMO | Attending: Emergency Medicine | Admitting: Emergency Medicine

## 2018-09-21 ENCOUNTER — Encounter (HOSPITAL_COMMUNITY): Payer: Self-pay | Admitting: Emergency Medicine

## 2018-09-21 ENCOUNTER — Emergency Department (HOSPITAL_COMMUNITY): Payer: Medicare HMO

## 2018-09-21 ENCOUNTER — Other Ambulatory Visit: Payer: Self-pay

## 2018-09-21 DIAGNOSIS — Z7984 Long term (current) use of oral hypoglycemic drugs: Secondary | ICD-10-CM | POA: Insufficient documentation

## 2018-09-21 DIAGNOSIS — F17210 Nicotine dependence, cigarettes, uncomplicated: Secondary | ICD-10-CM | POA: Diagnosis not present

## 2018-09-21 DIAGNOSIS — S161XXA Strain of muscle, fascia and tendon at neck level, initial encounter: Secondary | ICD-10-CM | POA: Insufficient documentation

## 2018-09-21 DIAGNOSIS — Y999 Unspecified external cause status: Secondary | ICD-10-CM | POA: Diagnosis not present

## 2018-09-21 DIAGNOSIS — B349 Viral infection, unspecified: Secondary | ICD-10-CM | POA: Diagnosis not present

## 2018-09-21 DIAGNOSIS — R509 Fever, unspecified: Secondary | ICD-10-CM | POA: Diagnosis not present

## 2018-09-21 DIAGNOSIS — Z7982 Long term (current) use of aspirin: Secondary | ICD-10-CM | POA: Insufficient documentation

## 2018-09-21 DIAGNOSIS — Z20828 Contact with and (suspected) exposure to other viral communicable diseases: Secondary | ICD-10-CM | POA: Insufficient documentation

## 2018-09-21 DIAGNOSIS — R05 Cough: Secondary | ICD-10-CM | POA: Diagnosis not present

## 2018-09-21 DIAGNOSIS — T148XXA Other injury of unspecified body region, initial encounter: Secondary | ICD-10-CM

## 2018-09-21 DIAGNOSIS — X58XXXA Exposure to other specified factors, initial encounter: Secondary | ICD-10-CM | POA: Diagnosis not present

## 2018-09-21 DIAGNOSIS — Z79899 Other long term (current) drug therapy: Secondary | ICD-10-CM | POA: Insufficient documentation

## 2018-09-21 DIAGNOSIS — Y939 Activity, unspecified: Secondary | ICD-10-CM | POA: Insufficient documentation

## 2018-09-21 DIAGNOSIS — M542 Cervicalgia: Secondary | ICD-10-CM | POA: Diagnosis present

## 2018-09-21 DIAGNOSIS — Y929 Unspecified place or not applicable: Secondary | ICD-10-CM | POA: Insufficient documentation

## 2018-09-21 DIAGNOSIS — J449 Chronic obstructive pulmonary disease, unspecified: Secondary | ICD-10-CM | POA: Diagnosis not present

## 2018-09-21 LAB — CBC WITH DIFFERENTIAL/PLATELET
Abs Immature Granulocytes: 0.05 10*3/uL (ref 0.00–0.07)
Basophils Absolute: 0.1 10*3/uL (ref 0.0–0.1)
Basophils Relative: 0 %
Eosinophils Absolute: 0.1 10*3/uL (ref 0.0–0.5)
Eosinophils Relative: 1 %
HCT: 43.2 % (ref 36.0–46.0)
Hemoglobin: 13.7 g/dL (ref 12.0–15.0)
Immature Granulocytes: 0 %
Lymphocytes Relative: 19 %
Lymphs Abs: 2.6 10*3/uL (ref 0.7–4.0)
MCH: 25.7 pg — ABNORMAL LOW (ref 26.0–34.0)
MCHC: 31.7 g/dL (ref 30.0–36.0)
MCV: 81.1 fL (ref 80.0–100.0)
Monocytes Absolute: 1.9 10*3/uL — ABNORMAL HIGH (ref 0.1–1.0)
Monocytes Relative: 13 %
Neutro Abs: 9.2 10*3/uL — ABNORMAL HIGH (ref 1.7–7.7)
Neutrophils Relative %: 67 %
Platelets: 239 10*3/uL (ref 150–400)
RBC: 5.33 MIL/uL — ABNORMAL HIGH (ref 3.87–5.11)
RDW: 16.2 % — ABNORMAL HIGH (ref 11.5–15.5)
WBC: 13.8 10*3/uL — ABNORMAL HIGH (ref 4.0–10.5)
nRBC: 0 % (ref 0.0–0.2)

## 2018-09-21 LAB — SARS CORONAVIRUS 2 BY RT PCR (HOSPITAL ORDER, PERFORMED IN ~~LOC~~ HOSPITAL LAB): SARS Coronavirus 2: NEGATIVE

## 2018-09-21 LAB — COMPREHENSIVE METABOLIC PANEL
ALT: 17 U/L (ref 0–44)
AST: 18 U/L (ref 15–41)
Albumin: 3.6 g/dL (ref 3.5–5.0)
Alkaline Phosphatase: 59 U/L (ref 38–126)
Anion gap: 11 (ref 5–15)
BUN: 11 mg/dL (ref 6–20)
CO2: 24 mmol/L (ref 22–32)
Calcium: 8.8 mg/dL — ABNORMAL LOW (ref 8.9–10.3)
Chloride: 98 mmol/L (ref 98–111)
Creatinine, Ser: 0.83 mg/dL (ref 0.44–1.00)
GFR calc Af Amer: >60 mL/min (ref >60)
GFR calc non Af Amer: >60 mL/min (ref >60)
Glucose, Bld: 209 mg/dL — ABNORMAL HIGH (ref 70–99)
Potassium: 3.6 mmol/L (ref 3.5–5.1)
Sodium: 133 mmol/L — ABNORMAL LOW (ref 135–145)
Total Bilirubin: 0.7 mg/dL (ref 0.3–1.2)
Total Protein: 7.9 g/dL (ref 6.5–8.1)

## 2018-09-21 MED ORDER — SODIUM CHLORIDE 0.9 % IV SOLN: INTRAVENOUS | Status: DC

## 2018-09-21 MED ORDER — KETOROLAC TROMETHAMINE 30 MG/ML IJ SOLN
15.0000 mg | Freq: Once | INTRAMUSCULAR | Status: AC
  Administered 2018-09-21: 15:00:00 15 mg via INTRAVENOUS
  Filled 2018-09-21: qty 1

## 2018-09-21 MED ORDER — NAPROXEN 500 MG PO TABS: 500.0000 mg | ORAL_TABLET | Freq: Two times a day (BID) | ORAL | 0 refills | Status: DC

## 2018-09-21 MED ORDER — LORAZEPAM 2 MG/ML IJ SOLN
0.5000 mg | Freq: Once | INTRAMUSCULAR | Status: AC
  Administered 2018-09-21: 0.5 mg via INTRAVENOUS
  Filled 2018-09-21: qty 1

## 2018-09-21 MED ORDER — METHOCARBAMOL 750 MG PO TABS: 750.0000 mg | ORAL_TABLET | Freq: Four times a day (QID) | ORAL | 0 refills | Status: DC

## 2018-09-21 MED ORDER — SODIUM CHLORIDE 0.9 % IV BOLUS
1000.0000 mL | Freq: Once | INTRAVENOUS | Status: AC
  Administered 2018-09-21: 1000 mL via INTRAVENOUS

## 2018-09-21 NOTE — ED Triage Notes (Signed)
Pt c/o since beginning of week neck and body pains, fevers, cough with yellow to green phlegm. Reports Biofreeze helped relieve some of the neck pains. Also has headache. Reports been taken Tylenol for fevers, last dose at 10am.

## 2018-09-21 NOTE — ED Provider Notes (Signed)
[facility] DEPT Provider Note   CSN: [id number] Arrival date & time: 09/21/18  1237    History   Chief Complaint Chief Complaint  Patient presents with  . Neck Pain  . Generalized Body Aches  . Fever  . Cough    HPI [name] is a 53 y.o. female.     53 year old female with history of bronchitis and COPD who continues to smoke presents with 1 week history of intermittent fevers up to 102 with associated body aches as well as cough.  Myalgias are mostly in her upper back and neck and now has spread to her extremities.  She denies any photophobia.  Pain is worse with movement of her neck to the sides and is better throughout the day when she starts moving around.  Denies any rashes.  States that she was working outside in the yard and felt that she may have strained a muscle.  Denies any recent sick exposures.  Has been treating himself with Tylenol at home.     Past Medical History:  Diagnosis Date  . Anemia   . Anxiety   . Asthmatic bronchitis    normal PFT/ seen by pulmonary no evidence of COPD  . Atypical chest pain 10/05/2017  . Chest pain on respiration 03/25/2014  . Chronic bronchitis ([place])   . Chronic lower back pain   . Chronic respiratory failure ([place]) 10/16/2011   Newly 02 dep 24/7 p discharge from [facility] 01/2013  - 03/13/2014  Walked RA  2 laps @ 185 ft each stopped due to  Sob/ aching in legs, thirsty/ no desat @ slow pace   . Chronic respiratory failure with hypoxia (HCC)    On 2-3 L of oxygen at home  . Cigarette smoker 12/02/2010   Followed in Pulmonary clinic/ [facility]/ [place]   - Limits of effective care reviewed 12/22/2011    . COPD (chronic obstructive pulmonary disease) ([place])   . COPD with chronic bronchitis ([place]) 10/01/2017  . Daily headache   . Depression   . Diabetic peripheral neuropathy ([place])   . DM (diabetes mellitus) type II controlled, neurological manifestation ([place]) 12/02/2010  . Gastric  erosions    EGD 08/2010.  [name] GERD (gastroesophageal reflux disease)   . H/O drug abuse ([place]) 11/12/2017   -- scanned document from outside source: [facility] in [place] which showed UDS positive for Adderall /amphetamine usage as well as positive urine for THC.  This test result was collected 08/11/2016 and reported 10/07/2016. - lso review of the chart shows another positive amphetamine, THC and METH in the urine back on 10/18/2015 under "care everywhere". ---So   . Heavy menses   . High cholesterol   . History of blood transfusion    "related to low HgB" (10/05/2017)  . History of hiatal hernia   . HTN (hypertension)   . Hyperlipidemia associated with type 2 diabetes mellitus ([place]) 10/18/2015  . Hypertension associated with diabetes ([place]) 12/02/2010   D/c acei 12/22/2011 due to psuedowheeze and narcotic dependent cough> ? Improved - 22/97/9892 started bystolic in place of cozar due to cough    . Increased urinary protein excretion   . Internal hemorrhoids    Colonoscopy 5/12.  . Iron deficiency anemia 10/01/2017  . Mixed diabetic hyperlipidemia associated with type 2 diabetes mellitus ([place]) 10/01/2017  . On home oxygen therapy    "5L at night" (10/05/2017)  . OSA on CPAP   .  Osteoarthritis    "back" (10/05/2017)  . Oxygen dependent 10/16/2011  . Pneumonia    "lots of times" (10/05/2017)  . Poorly controlled diabetes mellitus ([place]) 11/12/2017  . Pulmonary infiltrates 12/22/2011   Followed in Pulmonary clinic/ [facility]/ [place]    - See CT Chest 05/05/11   . Shortness of breath   . Tachycardia    never had test done since no insurance  . Tobacco use disorder-current smoker greater than 40-pack-year history- since age 13 1 ppd 10/01/2017  . Type II diabetes mellitus [facility])     Patient Active Problem List   Diagnosis Date Noted  . SOB (shortness of breath) 07/09/2018  . Fever, low grade 07/09/2018  . Cough in adult 07/09/2018  . Diabetes mellitus  ([place]) 03/11/2018  . Chronic right ear pain 03/11/2018  . Chronic sinusitis 03/11/2018  . H/O drug abuse ([place]) 11/12/2017  . Positive for macroalbuminuria 11/12/2017  . h/o freq Vaginal yeast infections 11/12/2017  . Poorly controlled diabetes mellitus ([place]) 11/12/2017  . Atypical chest pain 10/05/2017  . COPD with chronic bronchitis ([place]) 10/01/2017  . Mixed diabetic hyperlipidemia associated with type 2 diabetes mellitus ([place]) 10/01/2017  . Tobacco use disorder-current smoker greater than 40-pack-year history- since age 63 1 ppd 10/01/2017  . Tobacco abuse counseling 10/01/2017  . Diabetic peripheral neuropathy ([place]) 10/01/2017  . Iron deficiency anemia 10/01/2017  . Adult attention deficit hyperactivity disorder 01/14/2016  . Gastric reflux 10/18/2015  . Dyspepsia 09/23/2014  . Major depressive disorder, recurrent, severe without psychotic features ([place])   . MDD (major depressive disorder), recurrent episode, severe ([place]) 09/13/2014  . Nasal congestion 05/03/2014  . Chest pain on respiration 03/25/2014  . Leukocytosis 03/25/2014  . Upper airway cough syndrome 02/28/2014  . Abnormal drug screen 04/11/2013  . OSA on CPAP 04/11/2013  . Perforated ear drum 05/22/2012  . Bell's palsy 01/23/2012  . Carpal tunnel syndrome 01/23/2012  . Carpal tunnel syndrome on both sides 01/11/2012  . Pulmonary infiltrates 12/22/2011  . Chronic respiratory failure ([place]) 10/16/2011  . Oxygen dependent 10/16/2011  . Peripheral neuropathy 07/07/2011  . Vitamin D deficiency 07/06/2011  . Asthmatic bronchitis 06/11/2011  . Back pain 06/11/2011  . Morbid obesity ([place]) 05/06/2011  . Depression 05/04/2011  . Gastric erosions 12/05/2010  . Hypertension associated with diabetes ([place]) 12/02/2010  . Diabetes mellitus, type II ([place]) 12/02/2010  . Cigarette smoker 12/02/2010  . COPD with acute exacerbation ([place]) 11/30/2010  . Microcytic anemia 09/08/2010  . GERD (gastroesophageal reflux disease) 09/08/2010   . Esophageal dysphagia 09/08/2010    Past Surgical History:  Procedure Laterality Date  . CESAREAN SECTION  1988; 1989  . CHOLECYSTECTOMY OPEN  1990  . COLONOSCOPY  09/16/2010   NKN:LZJQBH colon/small internal hemorrhoids  . ESOPHAGOGASTRODUODENOSCOPY  09/16/2010   SLF: normal/mild gastritis  . ESOPHAGOGASTRODUODENOSCOPY N/A 10/19/2014   Procedure: ESOPHAGOGASTRODUODENOSCOPY (EGD);  Surgeon: Danie Binder, MD;  Location: [facility] ENDO SUITE;  Service: Endoscopy;  Laterality: N/A;  830  . FRACTURE SURGERY    . HYSTEROSCOPY WITH THERMACHOICE  01/17/2012   Procedure: HYSTEROSCOPY WITH THERMACHOICE;  Surgeon: Florian Buff, MD;  Location: [facility] ORS;  Service: Gynecology;  Laterality: N/A;  total therapy time: 9:13sec  D5W  18 ml in, D5W   65ml out, temperture 87degrees celcious  . KIDNEY SURGERY     as child for blockages  . TONSILLECTOMY    . TUBAL LIGATION  1989  . TYMPANOSTOMY TUBE PLACEMENT Bilateral    "several times when I  was a child"  . uterine ablation    . WRIST FRACTURE SURGERY Left 1995     OB History    Gravida  2   Para  2   Term  2   Preterm      AB      Living  2     SAB      TAB      Ectopic      Multiple      Live Births               Home Medications    Prior to Admission medications   Medication Sig Start Date End Date Taking? Authorizing Provider  acetaminophen (TYLENOL) 500 MG tablet Take 1,000 mg by mouth every 6 (six) hours as needed for mild pain or headache.    [provider]  ALPRAZolam Duanne Moron) 0.5 MG tablet Take 1 tablet (0.5 mg total) by mouth daily as needed for anxiety. 06/10/18 06/10/19  Cloria Spring, MD  amphetamine-dextroamphetamine (ADDERALL) 30 MG tablet TAKE 1 TABLET BY MOUTH 2 TIMES DAILY. 09/09/18   Cloria Spring, MD  aspirin EC 81 MG tablet Take 1 tablet (81 mg total) by mouth daily. 12/07/17   Dunn, Nedra Hai, PA-C  atorvastatin (LIPITOR) 80 MG tablet Take 1 tablet (80 mg total) by mouth daily. 12/07/17 03/12/19  Dunn,  Nedra Hai, PA-C  azithromycin (ZITHROMAX) 250 MG tablet 2 tabs day one, then one tab days 2-5 07/09/18   Danford, Valetta Fuller D, NP  BD PEN NEEDLE NANO U/F 32G X 4 MM MISC USE AS DIRECTED WITH VICTOZA 09/10/18   Opalski, Neoma Laming, DO  canagliflozin (INVOKANA) 300 MG TABS tablet Take 1 tablet (300 mg total) by mouth daily before breakfast. Patient needs office visit for further refills. 09/09/18   Opalski, Neoma Laming, DO  DULoxetine (CYMBALTA) 60 MG capsule Take 1 capsule (60 mg total) by mouth at bedtime. 06/10/18   Cloria Spring, MD  fluticasone (FLONASE) 50 MCG/ACT nasal spray USE 1 SPRAY IN EACH NOSTRIL TWICE DAILY AFTER SINUS RINSES 07/09/18   Danford, Valetta Fuller D, NP  Fluticasone-Salmeterol (ADVAIR DISKUS) 250-50 MCG/DOSE AEPB Inhale 1 puff into the lungs 2 (two) times daily. Flush mouth after each use 07/09/18   Danford, Valetta Fuller D, NP  isosorbide mononitrate (IMDUR) 30 MG 24 hr tablet Take 1 tablet (30 mg total) by mouth daily. 12/07/17 03/12/19  Dunn, Nedra Hai, PA-C  losartan (COZAAR) 25 MG tablet Take 1 tablet (25 mg total) by mouth daily. Patient needs office visit for further refills. 09/09/18   Opalski, Neoma Laming, DO  losartan-hydrochlorothiazide (HYZAAR) 100-12.5 MG tablet TAKE 1 TABLET BY MOUTH DAILY. 12/03/17   Opalski, Neoma Laming, DO  meclizine (ANTIVERT) 25 MG tablet Take 1 tablet (25 mg total) by mouth 3 (three) times daily as needed for dizziness or nausea. 12/07/17   Opalski, Neoma Laming, DO  omeprazole (PRILOSEC) 20 MG capsule TAKE 1 CAPSULE BY MOUTH DAILY FOLLOWING LARGEST MEAL. 07/08/18   Opalski, Neoma Laming, DO  pantoprazole (PROTONIX) 40 MG tablet Take 1 tablet (40 mg total) by mouth daily. 30 minutes before a meal. 10/06/17 12/08/18  Welford Roche, MD  predniSONE (DELTASONE) 20 MG tablet 2 tabs daily=40mg /day, take for 4 days 07/15/18   Danford, Valetta Fuller D, NP  pregabalin (LYRICA) 75 MG capsule TAKE 1 CAPSULE BY MOUTH 3 TIMES A DAY 07/09/18   Danford, Katy D, NP  VICTOZA 18 MG/3ML SOPN INJECT 0.3 MLS (1.8 MG TOTAL)  INTO THE SKIN DAILY. 09/10/18  Mellody Dance, DO  Vitamin D, Ergocalciferol, (DRISDOL) 50000 units CAPS capsule Take 1 capsule (50,000 Units total) by mouth every 7 (seven) days. 11/12/17   Mellody Dance, DO    Family History Family History  Problem Relation Age of Onset  . Heart attack Father 3       deceased, etoh use  . Heart disease Father   . Alcohol abuse Father   . Depression Father   . Heart attack Mother 84       deceased  . Diabetes Mother   . Breast cancer Mother   . Heart failure Mother        oxygen dependence, nonsmoker  . Heart disease Mother   . Depression Mother   . Cancer Mother   . Liver disease Maternal Aunt 66       died while on liver transplant list  . Heart attack Maternal Grandmother        premature CAD  . Ulcers Sister   . Hypertension Sister   . Colon cancer Neg Hx     Social History Social History   Tobacco Use  . Smoking status: Current Every Day Smoker    Packs/day: 1.00    Years: 39.00    Pack years: 39.00    Types: Cigarettes  . Smokeless tobacco: Never Used  Substance Use Topics  . Alcohol use: Yes    Comment: 10/05/2017 "5 - 8 shots tequilia//month"  . Drug use: Yes    Types: Marijuana    Comment: 10/05/2017 "a few times/wk"     Allergies   Codeine; Metformin and related; Wellbutrin [bupropion hcl]; and Acyclovir and related   Review of Systems Review of Systems  All other systems reviewed and are negative.    Physical Exam Updated Vital Signs BP 125/79   Pulse (!) 115   Temp 98.8 F (37.1 C) (Oral)   Resp 20   SpO2 97%   Physical Exam Vitals signs and nursing note reviewed.  Constitutional:      General: She is not in acute distress.    Appearance: Normal appearance. She is well-developed. She is not toxic-appearing.  HENT:     Head: Normocephalic and atraumatic.  Eyes:     General: Lids are normal.     Conjunctiva/sclera: Conjunctivae normal.     Pupils: Pupils are equal, round, and reactive to  light.  Neck:     Musculoskeletal: Neck supple. Pain with movement and muscular tenderness present. No neck rigidity.     Thyroid: No thyroid mass.     Trachea: No tracheal deviation.  Cardiovascular:     Rate and Rhythm: Regular rhythm. Tachycardia present.     Heart sounds: Normal heart sounds. No murmur. No gallop.   Pulmonary:     Effort: Pulmonary effort is normal. No respiratory distress.     Breath sounds: No stridor. Examination of the right-upper field reveals decreased breath sounds. Examination of the left-upper field reveals decreased breath sounds. Decreased breath sounds present. No wheezing, rhonchi or rales.  Abdominal:     General: Bowel sounds are normal. There is no distension.     Palpations: Abdomen is soft.     Tenderness: There is no abdominal tenderness. There is no rebound.  Musculoskeletal: Normal range of motion.        General: No tenderness.       Back:  Skin:    General: Skin is warm and dry.     Findings: No abrasion or rash.  Neurological:  Mental Status: She is alert and oriented to person, place, and time.     GCS: GCS eye subscore is 4. GCS verbal subscore is 5. GCS motor subscore is 6.     Cranial Nerves: No cranial nerve deficit.     Sensory: No sensory deficit.  Psychiatric:        Speech: Speech normal.        Behavior: Behavior normal.      ED Treatments / Results  Labs (all labs ordered are listed, but only abnormal results are displayed) Labs Reviewed  URINE CULTURE  SARS CORONAVIRUS 2 ([facility])  URINALYSIS, ROUTINE W REFLEX MICROSCOPIC  CBC WITH DIFFERENTIAL/PLATELET  COMPREHENSIVE METABOLIC PANEL    EKG None  Radiology No results found.  Procedures Procedures (including critical care time)  Medications Ordered in ED Medications  sodium chloride 0.9 % bolus 1,000 mL (has no administration in time range)  0.9 %  sodium chloride infusion (has no administration in time  range)     Initial Impression / Assessment and Plan / ED Course  I have reviewed the triage vital signs and the nursing notes.  Pertinent labs & imaging results that were available during my care of the patient were reviewed by me and considered in my medical decision making (see chart for details).        Patient's x-ray of her chest without acute findings here.  Test negative.  Treated for musculoskeletal strain and feels much better.  No suspicion for meningitis as pain appears to be musculoskeletal in nature. Patient stable for discharge with return precautions Final Clinical Impressions(s) / ED Diagnoses   Final diagnoses:  None    ED Discharge Orders    None       Lacretia Leigh, MD 09/21/18 1557

## 2018-09-24 ENCOUNTER — Ambulatory Visit (INDEPENDENT_AMBULATORY_CARE_PROVIDER_SITE_OTHER): Payer: Medicare HMO | Admitting: Family Medicine

## 2018-09-24 ENCOUNTER — Other Ambulatory Visit: Payer: Self-pay

## 2018-09-24 ENCOUNTER — Encounter: Payer: Self-pay | Admitting: Family Medicine

## 2018-09-24 VITALS — BP 136/83 | HR 108 | Temp 98.8°F | Ht 64.0 in | Wt 244.3 lb

## 2018-09-24 DIAGNOSIS — Z9119 Patient's noncompliance with other medical treatment and regimen: Secondary | ICD-10-CM

## 2018-09-24 DIAGNOSIS — I10 Essential (primary) hypertension: Secondary | ICD-10-CM

## 2018-09-24 DIAGNOSIS — K219 Gastro-esophageal reflux disease without esophagitis: Secondary | ICD-10-CM

## 2018-09-24 DIAGNOSIS — R059 Cough, unspecified: Secondary | ICD-10-CM

## 2018-09-24 DIAGNOSIS — Z09 Encounter for follow-up examination after completed treatment for conditions other than malignant neoplasm: Secondary | ICD-10-CM

## 2018-09-24 DIAGNOSIS — E1159 Type 2 diabetes mellitus with other circulatory complications: Secondary | ICD-10-CM

## 2018-09-24 DIAGNOSIS — R1310 Dysphagia, unspecified: Secondary | ICD-10-CM

## 2018-09-24 DIAGNOSIS — R809 Proteinuria, unspecified: Secondary | ICD-10-CM

## 2018-09-24 DIAGNOSIS — E782 Mixed hyperlipidemia: Secondary | ICD-10-CM

## 2018-09-24 DIAGNOSIS — E1129 Type 2 diabetes mellitus with other diabetic kidney complication: Secondary | ICD-10-CM

## 2018-09-24 DIAGNOSIS — R05 Cough: Secondary | ICD-10-CM | POA: Diagnosis not present

## 2018-09-24 DIAGNOSIS — R509 Fever, unspecified: Secondary | ICD-10-CM | POA: Diagnosis not present

## 2018-09-24 DIAGNOSIS — J449 Chronic obstructive pulmonary disease, unspecified: Secondary | ICD-10-CM

## 2018-09-24 DIAGNOSIS — R1319 Other dysphagia: Secondary | ICD-10-CM

## 2018-09-24 DIAGNOSIS — F17200 Nicotine dependence, unspecified, uncomplicated: Secondary | ICD-10-CM

## 2018-09-24 DIAGNOSIS — E1143 Type 2 diabetes mellitus with diabetic autonomic (poly)neuropathy: Secondary | ICD-10-CM

## 2018-09-24 DIAGNOSIS — Z91199 Patient's noncompliance with other medical treatment and regimen due to unspecified reason: Secondary | ICD-10-CM

## 2018-09-24 DIAGNOSIS — M791 Myalgia, unspecified site: Secondary | ICD-10-CM

## 2018-09-24 DIAGNOSIS — R1084 Generalized abdominal pain: Secondary | ICD-10-CM

## 2018-09-24 DIAGNOSIS — R110 Nausea: Secondary | ICD-10-CM

## 2018-09-24 DIAGNOSIS — E1169 Type 2 diabetes mellitus with other specified complication: Secondary | ICD-10-CM

## 2018-09-24 DIAGNOSIS — E1142 Type 2 diabetes mellitus with diabetic polyneuropathy: Secondary | ICD-10-CM

## 2018-09-24 MED ORDER — PREGABALIN 75 MG PO CAPS: 75.0000 mg | ORAL_CAPSULE | Freq: Three times a day (TID) | ORAL | 0 refills | Status: DC

## 2018-09-24 NOTE — Progress Notes (Signed)
This is a hospital discharge follow-up visit   Impression and Recommendations:    1. Hospital discharge follow-up   2. Personal history of noncompliance with medical treatment, presenting hazards to health   3. Fever, low grade   4. Cough in adult   5. Generalized muscle ache- especially neck/ upper back   6. Diabetic peripheral neuropathy (HCC)   7. Abdominal discomfort, generalized   8. Nausea   9. Type 2 diabetes mellitus with other specified complication, without long-term current use of insulin (HCC)   10. Mixed diabetic hyperlipidemia associated with type 2 diabetes mellitus ([place])   11. Hypertension associated with diabetes ([place])   12. Current every day smoker     1. Hospital discharge follow-up - -Regarding pt's recent hospitalization and/or ED visit: reviewed in great detail recent hospitalization notes, clinical lab tests, tests in the radiology section of [facility], tests in the medicine testing of [facility], and obtained history from pt- all this information directly influenced my plan of care for patient.  Moderate complexity  -Symptoms have now resolved of fever, cough; likely that her neck spasms and diffuse muscle aches is due to her coming off Lyrica days prior to her hospital visit.  She was not febrile in the ER - will check labs  - CBC with Differential/Platelet - Comprehensive metabolic panel - TSH - T4, free - VITAMIN D 25 Hydroxy (Vit-D Deficiency, Fractures) - Hemoglobin A1c - Magnesium - Phosphorus  2. Personal history of noncompliance with medical treatment, presenting hazards to health --- Extensively discussed with patient how her lack of proper follow-up etc. is a detriment to her health.  -Patient recently moved with her family out to greater [place] area.  She has been lost to follow-up, and I recommend she establish with a provider closer to her home so would be easier for her to get to.  She moved in March  3. Fever, low grade Now resolved - cont  vigilance  - CBC with Differential/Platelet - Comprehensive metabolic panel - TSH - T4, free - VITAMIN D 25 Hydroxy (Vit-D Deficiency, Fractures) - Hemoglobin A1c - Magnesium - Phosphorus  4. Cough in adult Acutely resolved, does have a chronic cough due to COPD.  Continues to smoke-  - CBC with Differential/Platelet - Comprehensive metabolic panel - TSH - T4, free - VITAMIN D 25 Hydroxy (Vit-D Deficiency, Fractures) - Hemoglobin A1c - Magnesium - Phosphorus  5. Generalized muscle ache- especially neck/ upper back Especially worse likely due to her coming off her Lyrica 2 weeks ago.   - CBC with Differential/Platelet - Comprehensive metabolic panel - TSH - T4, free - VITAMIN D 25 Hydroxy (Vit-D Deficiency, Fractures) - Hemoglobin A1c - Magnesium - Phosphorus  6. Diabetic peripheral neuropathy (HCC) Refilled Lyrica 1 month. -Told patient she needs more regular follow-up with [place] - pregabalin (LYRICA) 75 MG capsule; Take 1 capsule (75 mg total) by mouth 3 (three) times daily.  Dispense: 90 capsule; Refill: 0   7. Abdominal discomfort, generalized No acute findings today.  Likely diabetic gastroparesis is contributing with her very high blood sugars lately-random ones in the ED was over 200 -Explained to patient she needs to monitor her blood sugars, blood pressures etc. -We will obtain blood work today -Red flag symptoms discussed and if she develops any, she will let us know. - CBC with Differential/Platelet - Comprehensive metabolic panel - TSH - T4, free - VITAMIN D 25 Hydroxy (Vit-D Deficiency, Fractures) - Hemoglobin A1c - Magnesium -  Phosphorus  8. Nausea Patient denies need for medications today. - CBC with Differential/Platelet - Comprehensive metabolic panel - TSH - T4, free - VITAMIN D 25 Hydroxy (Vit-D Deficiency, Fractures) - Hemoglobin A1c - Magnesium - Phosphorus  9. Type 2 diabetes mellitus with other specified complication, without  long-term current use of insulin (HCC) Fasting blood sugar 2-hour postprandial recommended.  Needs a routine follow-up near future - Comprehensive metabolic panel - Hemoglobin A1c - Magnesium - Phosphorus  10. Mixed diabetic hyperlipidemia associated with type 2 diabetes mellitus (HCC) - Comprehensive metabolic panel - Hemoglobin A1c - Magnesium - Phosphorus  11. Hypertension associated with diabetes ([place]) - Comprehensive metabolic panel - Hemoglobin A1c - Magnesium - Phosphorus    Orders Placed This Encounter  Procedures  . CBC with Differential/Platelet  . Comprehensive metabolic panel  . TSH  . T4, free  . VITAMIN D 25 Hydroxy (Vit-D Deficiency, Fractures)  . Hemoglobin A1c  . Magnesium  . Phosphorus    Meds ordered this encounter  Medications  . pregabalin (LYRICA) 75 MG capsule    Sig: Take 1 capsule (75 mg total) by mouth 3 (three) times daily.    Dispense:  90 capsule    Refill:  0    Medications Discontinued During This Encounter  Medication Reason  . predniSONE (DELTASONE) 20 MG tablet Completed Course  . pregabalin (LYRICA) 75 MG capsule Reorder     Gross side effects, risk and benefits, and alternatives of medications and treatment plan in general discussed with patient.  Patient is aware that all medications have potential side effects and we are unable to predict every side effect or drug-drug interaction that may occur.   Patient will call with any questions prior to using medication if they have concerns.    Expresses verbal understanding and consents to current therapy and treatment regimen.  No barriers to understanding were identified.  Red flag symptoms and signs discussed in detail.  Patient expressed understanding regarding what to do in case of emergency\urgent symptoms  Please see AVS handed out to patient at the end of our visit for further patient instructions/ counseling done pertaining to today's office visit.   Return for Follow-up in  3-4 weeks or with a new PCP near your new home.     Note:  This note was prepared with assistance of Dragon voice recognition software. Occasional wrong-word or sound-a-like substitutions may have occurred due to the inherent limitations of voice recognition software.      --------------------------------------------------------------------------------------------------------------------------------------------------------------------------------------------------------------------------------------------    Subjective:     HPI: [name] is a 53 y.o. female who presents to [facility] today for issues as discussed below.   Patient has long history of medical noncompliance and last seen by me in November 2019 was post follow-up every 3 months or sooner.   Presents today as she was seen in the [facility] health ED at [address] on 09/21/2018 with complaints of neck pain, generalized body aches, fever and cough.   Prior to that she had 1 week of 102 fevers intermittently along with severe muscle aches.   In the ED, COVID test was done, chest x-ray as well as some blood work- CMP and CBC etc.  Her blood sugar at that time was 209 and she had not eaten anything in a while.  They discharged her home after reassuring labs on Robaxin as well as Naprosyn.    Overall her symptoms of fever, cough and severe neck pain  have resolved.  She still complains of generalized myalgias and ongoing achiness in all of her muscles of her body- feels a little worse than her normal state of health.  -Today:  She tells me that several days prior to being seen in the ED she stopped her Lyrica- 75 mg 3 times daily.    This was because she never followed up with [place] and we did not give her refills.  She thinks this had played a role as well in her increased pains  - Also she has not been checking her blood sugars or her blood pressures at home hence she has no idea how they are  doing.    Today while she says she has not had any more fevers, cough etc. she does complain of generalized abdominal pain and nausea.  She states she has had it many times in the past- and this time feels no different.  She denies any diarrhea or constipation.  She denies any vomiting.   - She has been eating and drinking okay but states just a smaller amount than usual.  She has no idea how her blood sugars are doing as she is not checking them.  Historically they have been poorly controlled and her abd sx can be from the elevated BS's     Lab Results  Component Value Date   HGBA1C 7.8 (H) 02/12/2018   HGBA1C 8.0 (A) 01/10/2018   HGBA1C 10.2 (A) 10/01/2017      Wt Readings from Last 3 Encounters:  09/24/18 244 lb 4.8 oz (110.8 kg)  07/09/18 249 lb (112.9 kg)  03/11/18 250 lb 6.4 oz (113.6 kg)   BP Readings from Last 3 Encounters:  09/24/18 136/83  09/21/18 (!) 99/46  07/09/18 (!) 147/88   Pulse Readings from Last 3 Encounters:  09/24/18 (!) 108  09/21/18 97  07/09/18 99   BMI Readings from Last 3 Encounters:  09/24/18 41.93 kg/m  07/09/18 42.74 kg/m  03/11/18 42.98 kg/m     Patient Care Team    Relationship Specialty Notifications Start End  Mellody Dance, DO PCP - General Family Medicine  10/05/17   Belva Crome, MD PCP - Cardiology Cardiology Admissions 10/05/17   Danie Binder, MD  Gastroenterology  09/08/10   Cloria Spring, [place] [facility]  09/24/18      Patient Active Problem List   Diagnosis Date Noted  . Poorly controlled diabetes mellitus ([place]) 11/12/2017    Priority: High  . Mixed diabetic hyperlipidemia associated with type 2 diabetes mellitus ([place]) 10/01/2017    Priority: High  . Tobacco use disorder-current smoker greater than 40-pack-year history- since age 69 1 ppd 10/01/2017    Priority: High  . Hypertension associated with diabetes ([place]) 12/02/2010    Priority: High  . Diabetes mellitus, type II ([place])  12/02/2010    Priority: High  . Positive for macroalbuminuria 11/12/2017    Priority: Medium  . COPD with chronic bronchitis ([place]) 10/01/2017    Priority: Medium  . Diabetic peripheral neuropathy ([place]) 10/01/2017    Priority: Medium  . Major depressive disorder, recurrent, severe without psychotic features ([place])     Priority: Medium  . Vitamin D deficiency 07/06/2011    Priority: Medium  . Morbid obesity ([place]) 05/06/2011    Priority: Medium  . H/O drug abuse ([place]) 11/12/2017    Priority: Low  . h/o freq Vaginal yeast infections 11/12/2017    Priority: Low  . Adult attention deficit hyperactivity disorder  01/14/2016    Priority: Low  . Gastric reflux 10/18/2015    Priority: Low  . Asthmatic bronchitis 06/11/2011    Priority: Low  . Generalized muscle ache 09/24/2018  . Personal history of noncompliance with medical treatment, presenting hazards to health 09/24/2018  . SOB (shortness of breath) 07/09/2018  . Fever, low grade 07/09/2018  . Cough in adult 07/09/2018  . Diabetes mellitus ([place]) 03/11/2018  . Chronic right ear pain 03/11/2018  . Chronic sinusitis 03/11/2018  . Atypical chest pain 10/05/2017  . Tobacco abuse counseling 10/01/2017  . Iron deficiency anemia 10/01/2017  . Dyspepsia 09/23/2014  . MDD (major depressive disorder), recurrent episode, severe ([place]) 09/13/2014  . Nasal congestion 05/03/2014  . Chest pain on respiration 03/25/2014  . Leukocytosis 03/25/2014  . Upper airway cough syndrome 02/28/2014  . Abnormal drug screen 04/11/2013  . OSA on CPAP 04/11/2013  . Perforated ear drum 05/22/2012  . Bell's palsy 01/23/2012  . Carpal tunnel syndrome 01/23/2012  . Carpal tunnel syndrome on both sides 01/11/2012  . Pulmonary infiltrates 12/22/2011  . Chronic respiratory failure ([place]) 10/16/2011  . Oxygen dependent 10/16/2011  . Peripheral neuropathy 07/07/2011  . Back pain 06/11/2011  . Depression 05/04/2011  . Gastric erosions 12/05/2010  . Cigarette  smoker 12/02/2010  . COPD with acute exacerbation ([employer]) 11/30/2010  . Microcytic anemia 09/08/2010  . GERD (gastroesophageal reflux disease) 09/08/2010  . Esophageal dysphagia 09/08/2010    Past Medical history, Surgical history, Family history, Social history, Allergies and Medications have been entered into the medical record, reviewed and changed as needed.    Current Meds  Medication Sig  . acetaminophen (TYLENOL) 500 MG tablet Take 1,000 mg by mouth every 6 (six) hours as needed for mild pain or headache.  . ALPRAZolam (XANAX) 0.5 MG tablet Take 1 tablet (0.5 mg total) by mouth daily as needed for anxiety.  [name] amphetamine-dextroamphetamine (ADDERALL) 30 MG tablet TAKE 1 TABLET BY MOUTH 2 TIMES DAILY.  [name] aspirin EC 81 MG tablet Take 1 tablet (81 mg total) by mouth daily.  [name] atorvastatin (LIPITOR) 80 MG tablet Take 1 tablet (80 mg total) by mouth daily.  [name] azithromycin (ZITHROMAX) 250 MG tablet 2 tabs day one, then one tab days 2-5  . BD PEN NEEDLE NANO U/F 32G X 4 MM MISC USE AS DIRECTED WITH VICTOZA  . canagliflozin (INVOKANA) 300 MG TABS tablet Take 1 tablet (300 mg total) by mouth daily before breakfast. Patient needs office visit for further refills.  . DULoxetine (CYMBALTA) 60 MG capsule Take 1 capsule (60 mg total) by mouth at bedtime. (Patient taking differently: Take 60 mg by mouth daily. )  . fluticasone (FLONASE) 50 MCG/ACT nasal spray USE 1 SPRAY IN EACH NOSTRIL TWICE DAILY AFTER SINUS RINSES  . Fluticasone-Salmeterol (ADVAIR DISKUS) 250-50 MCG/DOSE AEPB Inhale 1 puff into the lungs 2 (two) times daily. Flush mouth after each use  . isosorbide mononitrate (IMDUR) 30 MG 24 hr tablet Take 1 tablet (30 mg total) by mouth daily.  [name] losartan (COZAAR) 25 MG tablet Take 1 tablet (25 mg total) by mouth daily. Patient needs office visit for further refills.  [name] losartan-hydrochlorothiazide (HYZAAR) 100-12.5 MG tablet TAKE 1 TABLET BY MOUTH DAILY.  . meclizine (ANTIVERT) 25 MG tablet  Take 1 tablet (25 mg total) by mouth 3 (three) times daily as needed for dizziness or nausea.  . methocarbamol (ROBAXIN-750) 750 MG tablet Take 1 tablet (750 mg total) by mouth 4 (four) times daily.  . naproxen (  NAPROSYN) 500 MG tablet Take 1 tablet (500 mg total) by mouth 2 (two) times daily.  [name] omeprazole (PRILOSEC) 20 MG capsule TAKE 1 CAPSULE BY MOUTH DAILY FOLLOWING LARGEST MEAL. (Patient taking differently: Take 20 mg by mouth daily. )  . pantoprazole (PROTONIX) 40 MG tablet Take 1 tablet (40 mg total) by mouth daily. 30 minutes before a meal. (Patient taking differently: Take by mouth daily. 30 minutes before a meal.)  . pregabalin (LYRICA) 75 MG capsule Take 1 capsule (75 mg total) by mouth 3 (three) times daily.  [name] VICTOZA 18 MG/3ML SOPN INJECT 0.3 MLS (1.8 MG TOTAL) INTO THE SKIN DAILY. (Patient taking differently: Inject 1.8 mg into the skin daily. )  . Vitamin D, Ergocalciferol, (DRISDOL) 50000 units CAPS capsule Take 1 capsule (50,000 Units total) by mouth every 7 (seven) days.  . [DISCONTINUED] pregabalin (LYRICA) 75 MG capsule TAKE 1 CAPSULE BY MOUTH 3 TIMES A DAY   Current Facility-Administered Medications for the 09/24/18 encounter (Office Visit) with Mellody Dance, DO  Medication  . ipratropium-albuterol (DUONEB) 0.5-2.5 (3) MG/3ML nebulizer solution 3 mL    Allergies:  Allergies  Allergen Reactions  . Codeine Itching and Nausea Only  . Metformin And Related Diarrhea and Other (See Comments)    Stomach pain/nausea  . Wellbutrin [Bupropion Hcl] Hives  . Acyclovir And Related Rash     Review of Systems:  A fourteen system review of systems was performed and found to be positive as per HPI.   Objective:   Blood pressure 136/83, pulse (!) 108, temperature 98.8 F (37.1 C), height 5\' 4"  (1.626 m), weight 244 lb 4.8 oz (110.8 kg), SpO2 96 %. Body mass index is 41.93 kg/m. General:  Well Developed, well nourished, older than stated age.  Neuro:  Alert and oriented,   extra-ocular muscles intact  HEENT:  Normocephalic, atraumatic, neck supple, no carotid bruits appreciated  Skin:  no gross rash, warm, pink. Cardiac:  RRR, S1 S2 Respiratory: Rhonchorous breath sounds B/L and A/P with occasional and expiratory wheeze; extended exhalation phase, Not using accessory muscles, speaking in full sentences- unlabored. Abdomen: Obese, mildly distended, diffusely tender to palpation over entire abdomen- Not localized.  Positive bowel sounds x4.  No suprapubic tenderness or flank tenderness bilaterally.   Musculoskeletal: Patient with diffuse tenderness and multiple trigger points with muscle spasms bilateral upper shoulders and paraspinal muscles throughout entire back-worse at the cervical region.  Vascular:  Ext warm, no cyanosis apprec.; cap RF less 2 sec. Psych:  No HI/SI, judgement and insight good, Euthymic mood. Full Affect.

## 2018-09-25 LAB — COMPREHENSIVE METABOLIC PANEL
ALT: 13 IU/L (ref 0–32)
AST: 12 IU/L (ref 0–40)
Albumin/Globulin Ratio: 1.2 (ref 1.2–2.2)
Albumin: 3.8 g/dL (ref 3.8–4.9)
Alkaline Phosphatase: 69 IU/L (ref 39–117)
BUN/Creatinine Ratio: 12 (ref 9–23)
BUN: 7 mg/dL (ref 6–24)
Bilirubin Total: <0.2 mg/dL (ref 0.0–1.2)
CO2: 25 mmol/L (ref 20–29)
Calcium: 9.2 mg/dL (ref 8.7–10.2)
Chloride: 96 mmol/L (ref 96–106)
Creatinine, Ser: 0.57 mg/dL (ref 0.57–1.00)
GFR calc Af Amer: 123 mL/min/{1.73_m2} (ref >59)
GFR calc non Af Amer: 107 mL/min/{1.73_m2} (ref >59)
Globulin, Total: 3.2 g/dL (ref 1.5–4.5)
Glucose: 117 mg/dL — ABNORMAL HIGH (ref 65–99)
Potassium: 3.7 mmol/L (ref 3.5–5.2)
Sodium: 141 mmol/L (ref 134–144)
Total Protein: 7 g/dL (ref 6.0–8.5)

## 2018-09-25 LAB — VITAMIN D 25 HYDROXY (VIT D DEFICIENCY, FRACTURES): Vit D, 25-Hydroxy: 46.7 ng/mL (ref 30.0–100.0)

## 2018-09-25 LAB — T4, FREE: Free T4: 1.33 ng/dL (ref 0.82–1.77)

## 2018-09-25 LAB — CBC WITH DIFFERENTIAL/PLATELET
Basophils Absolute: 0.1 10*3/uL (ref 0.0–0.2)
Basos: 1 %
EOS (ABSOLUTE): 0.1 10*3/uL (ref 0.0–0.4)
Eos: 1 %
Hematocrit: 38.9 % (ref 34.0–46.6)
Hemoglobin: 13.2 g/dL (ref 11.1–15.9)
Immature Grans (Abs): 0.1 10*3/uL (ref 0.0–0.1)
Immature Granulocytes: 1 %
Lymphocytes Absolute: 2.9 10*3/uL (ref 0.7–3.1)
Lymphs: 21 %
MCH: 25.3 pg — ABNORMAL LOW (ref 26.6–33.0)
MCHC: 33.9 g/dL (ref 31.5–35.7)
MCV: 75 fL — ABNORMAL LOW (ref 79–97)
Monocytes Absolute: 1.3 10*3/uL — ABNORMAL HIGH (ref 0.1–0.9)
Monocytes: 9 %
Neutrophils Absolute: 9.4 10*3/uL — ABNORMAL HIGH (ref 1.4–7.0)
Neutrophils: 67 %
Platelets: 321 10*3/uL (ref 150–450)
RBC: 5.21 x10E6/uL (ref 3.77–5.28)
RDW: 14.5 % (ref 11.7–15.4)
WBC: 13.8 10*3/uL — ABNORMAL HIGH (ref 3.4–10.8)

## 2018-09-25 LAB — TSH: TSH: 1.63 u[IU]/mL (ref 0.450–4.500)

## 2018-09-25 LAB — HEMOGLOBIN A1C
Est. average glucose Bld gHb Est-mCnc: 174 mg/dL
Hgb A1c MFr Bld: 7.7 % — ABNORMAL HIGH (ref 4.8–5.6)

## 2018-09-25 LAB — MAGNESIUM: Magnesium: 2.1 mg/dL (ref 1.6–2.3)

## 2018-09-25 LAB — PHOSPHORUS: Phosphorus: 3.9 mg/dL (ref 3.0–4.3)

## 2018-10-04 ENCOUNTER — Other Ambulatory Visit: Payer: Self-pay

## 2018-10-04 ENCOUNTER — Ambulatory Visit (HOSPITAL_COMMUNITY): Payer: Medicare HMO | Admitting: Psychiatry

## 2018-10-04 ENCOUNTER — Telehealth (HOSPITAL_COMMUNITY): Payer: Self-pay | Admitting: Psychiatry

## 2018-10-07 ENCOUNTER — Ambulatory Visit (INDEPENDENT_AMBULATORY_CARE_PROVIDER_SITE_OTHER): Payer: Medicare HMO | Admitting: Psychiatry

## 2018-10-07 ENCOUNTER — Other Ambulatory Visit: Payer: Self-pay

## 2018-10-07 ENCOUNTER — Encounter (HOSPITAL_COMMUNITY): Payer: Self-pay | Admitting: Psychiatry

## 2018-10-07 DIAGNOSIS — F901 Attention-deficit hyperactivity disorder, predominantly hyperactive type: Secondary | ICD-10-CM | POA: Diagnosis not present

## 2018-10-07 DIAGNOSIS — F331 Major depressive disorder, recurrent, moderate: Secondary | ICD-10-CM | POA: Diagnosis not present

## 2018-10-07 MED ORDER — AMPHETAMINE-DEXTROAMPHETAMINE 30 MG PO TABS: 30.0000 mg | ORAL_TABLET | Freq: Two times a day (BID) | ORAL | 0 refills | Status: DC

## 2018-10-07 MED ORDER — AMPHETAMINE-DEXTROAMPHETAMINE 30 MG PO TABS: 1.0000 | ORAL_TABLET | Freq: Two times a day (BID) | ORAL | 0 refills | Status: DC

## 2018-10-07 MED ORDER — DULOXETINE HCL 60 MG PO CPEP: 60.0000 mg | ORAL_CAPSULE | Freq: Every day | ORAL | 2 refills | Status: DC

## 2018-10-07 MED ORDER — ALPRAZOLAM 0.5 MG PO TABS: 0.5000 mg | ORAL_TABLET | Freq: Every day | ORAL | 2 refills | Status: DC | PRN

## 2018-10-07 NOTE — Progress Notes (Signed)
Virtual Visit via Telephone Note  I connected with [place] on 10/07/18 at  1:00 PM EDT by telephone and verified that I am speaking with the correct person using two identifiers.   I discussed the limitations, risks, security and privacy concerns of performing an evaluation and management service by telephone and the availability of in person appointments. I also discussed with the patient that there may be a patient responsible charge related to this service. The patient expressed understanding and agreed to proceed.     I discussed the assessment and treatment plan with the patient. The patient was provided an opportunity to ask questions and all were answered. The patient agreed with the plan and demonstrated an understanding of the instructions.   The patient was advised to call back or seek an in-person evaluation if the symptoms worsen or if the condition fails to improve as anticipated.  I provided 15 minutes of non-face-to-face time during this encounter.   [name], MD  [facility] MD/PA/NP OP Progress Note  10/07/2018 1:16 PM [name]  MRN:  [id number]  Chief Complaint:  Chief Complaint    Depression; Anxiety; Follow-up     HPI: This patient is a 53 year old married [place] and wants to resume care here. She is currently on disability.  The patient was last seen here on 05/07/2015. At that time she was being treated for depression anxiety and ADHD. [place] Her primary physician there continued her medications which included Cymbalta and Adderall and occasional Xanax.  The patient moved back to this area last September. Her daughter was having difficulties. Her daughter was developing severe hydradenitis suprativaand was also  pregnant. She and her boyfriend came back here to support the daughter. Since then she is been hospitalized twice for pneumonia. She is gotten off all of her medications for diabetes hypertension cholesterol etc. but has establish care with a primary care physician in the [facility] system. Last month she was restarted on all of her medications. However she is off all of her psychiatric medicines.  The patient states that without the Cymbalta she is somewhat depressed and is also experiencing more neuropathy in her feet. She is off the Adderall and cannot stay focused at all. She denies being seriously depressed or suicidal. Her sleep is fairly good most of the time but the neuropathy keeps her up at night. She is very concerned about her daughter who is now suffering from postpartum depression and is not getting adequate help. The patient states that she could use Xanax but only very periodically and I think this is reasonable.  The patient returns after 3 months.  She states that she and her husband have moved into [place] and now both her daughters and their daughter's children are living in the home.  These children range in age from toddlers to an 68 year old daughter.  The patient generally likes it and it keeps her mind off other problems like her sister's recent death.  Overall her mood has been good she has been sleeping well at night and the increased dosage of Adderall is helping with her focus.  She denies serious depression or panic attacks.  She uses the Xanax fairly sparingly. Visit Diagnosis:    ICD-10-CM   1. Attention deficit hyperactivity disorder (ADHD),  predominantly hyperactive type  F90.1   2. Moderate episode of recurrent major depressive disorder ([place])  F33.1     Past Psychiatric History:  2 prior psychiatric hospitalizations in her 59s.  Her last hospitalization was in 2006 for drug overdose.  Since then she has received outpatient treatment Past Medical History:  Past  Medical History:  Diagnosis Date  . Anemia   . Anxiety   . Asthmatic bronchitis    normal PFT/ seen by pulmonary no evidence of COPD  . Atypical chest pain 10/05/2017  . Chest pain on respiration 03/25/2014  . Chronic bronchitis ([place])   . Chronic lower back pain   . Chronic respiratory failure ([place]) 10/16/2011   Newly 02 dep 24/7 p discharge from [facility] 01/2013  - 03/13/2014  Walked RA  2 laps @ 185 ft each stopped due to  Sob/ aching in legs, thirsty/ no desat @ slow pace   . Chronic respiratory failure with hypoxia (HCC)    On 2-3 L of oxygen at home  . Cigarette smoker 12/02/2010   Followed in Pulmonary clinic/ [facility]/ [place]   - Limits of effective care reviewed 12/22/2011    . COPD (chronic obstructive pulmonary disease) ([place])   . COPD with chronic bronchitis ([place]) 10/01/2017  . Daily headache   . Depression   . Diabetic peripheral neuropathy ([place])   . DM (diabetes mellitus) type II controlled, neurological manifestation ([place]) 12/02/2010  . Gastric erosions    EGD 08/2010.  [name] GERD (gastroesophageal reflux disease)   . H/O drug abuse ([place]) 11/12/2017   -- scanned document from outside source: [facility] in [place] which showed UDS positive for Adderall /amphetamine usage as well as positive urine for THC.  This test result was collected 08/11/2016 and reported 10/07/2016. - lso review of the chart shows another positive amphetamine, THC and METH in the urine back on 10/18/2015 under "care everywhere". ---So   . Heavy menses   . High cholesterol   . History of blood transfusion    "related to low HgB" (10/05/2017)  . History of hiatal hernia   . HTN (hypertension)   . Hyperlipidemia associated with type 2 diabetes mellitus ([place]) 10/18/2015  . Hypertension associated with diabetes ([place]) 12/02/2010   D/c acei 12/22/2011 due to psuedowheeze and narcotic dependent cough> ? Improved - 99/24/2683 started bystolic in place of cozar due to cough    .  Increased urinary protein excretion   . Internal hemorrhoids    Colonoscopy 5/12.  . Iron deficiency anemia 10/01/2017  . Mixed diabetic hyperlipidemia associated with type 2 diabetes mellitus ([place]) 10/01/2017  . On home oxygen therapy    "5L at night" (10/05/2017)  . OSA on CPAP   . Osteoarthritis    "back" (10/05/2017)  . Oxygen dependent 10/16/2011  . Pneumonia    "lots of times" (10/05/2017)  . Poorly controlled diabetes mellitus ([place]) 11/12/2017  . Pulmonary infiltrates 12/22/2011   Followed in Pulmonary clinic/ [facility]/ [place]    - See CT Chest 05/05/11   . Shortness of breath   . Tachycardia    never had test done since no insurance  . Tobacco use disorder-current smoker greater than 40-pack-year history- since age 27 1 ppd 10/01/2017  . Type II diabetes mellitus ([place])     Past Surgical History:  Procedure Laterality Date  . CESAREAN SECTION  1988; 1989  . CHOLECYSTECTOMY OPEN  1990  . COLONOSCOPY  09/16/2010  PJK:DTOIZT colon/small internal hemorrhoids  . ESOPHAGOGASTRODUODENOSCOPY  09/16/2010   SLF: normal/mild gastritis  . ESOPHAGOGASTRODUODENOSCOPY N/A 10/19/2014   Procedure: ESOPHAGOGASTRODUODENOSCOPY (EGD);  Surgeon: Danie Binder, MD;  Location: [facility] ENDO SUITE;  Service: Endoscopy;  Laterality: N/A;  830  . FRACTURE SURGERY    . HYSTEROSCOPY WITH THERMACHOICE  01/17/2012   Procedure: HYSTEROSCOPY WITH THERMACHOICE;  Surgeon: Florian Buff, MD;  Location: [facility] ORS;  Service: Gynecology;  Laterality: N/A;  total therapy time: 9:13sec  D5W  18 ml in, D5W   14ml out, temperture 87degrees celcious  . KIDNEY SURGERY     as child for blockages  . TONSILLECTOMY    . TUBAL LIGATION  1989  . TYMPANOSTOMY TUBE PLACEMENT Bilateral    "several times when I was a child"  . uterine ablation    . WRIST FRACTURE SURGERY Left 1995    Family Psychiatric History:see below  Family History:  Family History  Problem Relation Age of Onset  . Heart attack Father 13       deceased,  etoh use  . Heart disease Father   . Alcohol abuse Father   . Depression Father   . Heart attack Mother 3       deceased  . Diabetes Mother   . Breast cancer Mother   . Heart failure Mother        oxygen dependence, nonsmoker  . Heart disease Mother   . Depression Mother   . Cancer Mother   . Liver disease Maternal Aunt 73       died while on liver transplant list  . Heart attack Maternal Grandmother        premature CAD  . Ulcers Sister   . Hypertension Sister   . Colon cancer Neg Hx     Social History:  Social History   Socioeconomic History  . Marital status: Married    Spouse name: Not on file  . Number of children: 2  . Years of education: Not on file  . Highest education level: Not on file  Occupational History  . Occupation: unemployed  Social Needs  . Financial resource strain: Not on file  . Food insecurity    Worry: Not on file    Inability: Not on file  . Transportation needs    Medical: Not on file    Non-medical: Not on file  Tobacco Use  . Smoking status: Current Every Day Smoker    Packs/day: 1.00    Years: 39.00    Pack years: 39.00    Types: Cigarettes  . Smokeless tobacco: Never Used  Substance and Sexual Activity  . Alcohol use: Yes    Comment: 10/05/2017 "5 - 8 shots tequilia//month"  . Drug use: Yes    Types: Marijuana    Comment: 10/05/2017 "a few times/wk"  . Sexual activity: Yes    Birth control/protection: Surgical  Lifestyle  . Physical activity    Days per week: Not on file    Minutes per session: Not on file  . Stress: Not on file  Relationships  . Social Herbalist on phone: Not on file    Gets together: Not on file    Attends religious service: Not on file    Active member of club or organization: Not on file    Attends meetings of clubs or organizations: Not on file    Relationship status: Not on file  Other Topics Concern  . Not on file  Social  History Narrative  . Not on file    Allergies:  Allergies   Allergen Reactions  . Codeine Itching and Nausea Only  . Metformin And Related Diarrhea and Other (See Comments)    Stomach pain/nausea  . Wellbutrin [Bupropion Hcl] Hives  . Acyclovir And Related Rash    Metabolic Disorder Labs: Lab Results  Component Value Date   HGBA1C 7.7 (H) 09/24/2018   MPG 160 (H) 03/10/2015   MPG 177 (H) 08/31/2014   No results found for: PROLACTIN Lab Results  Component Value Date   CHOL 131 02/12/2018   TRIG 95 02/12/2018   HDL 38 (L) 02/12/2018   CHOLHDL 3.4 02/12/2018   VLDL 35 10/06/2017   LDLCALC 74 02/12/2018   LDLCALC 132 (H) 10/06/2017   Lab Results  Component Value Date   TSH 1.630 09/24/2018   TSH 1.961 10/05/2017    Therapeutic Level Labs: No results found for: LITHIUM No results found for: VALPROATE No components found for:  CBMZ  Current Medications: Current Outpatient Medications  Medication Sig Dispense Refill  . acetaminophen (TYLENOL) 500 MG tablet Take 1,000 mg by mouth every 6 (six) hours as needed for mild pain or headache.    . ALPRAZolam (XANAX) 0.5 MG tablet Take 1 tablet (0.5 mg total) by mouth daily as needed for anxiety. 30 tablet 2  . amphetamine-dextroamphetamine (ADDERALL) 30 MG tablet Take 1 tablet by mouth 2 (two) times daily. 60 tablet 0  . amphetamine-dextroamphetamine (ADDERALL) 30 MG tablet Take 1 tablet by mouth 2 (two) times daily. 60 tablet 0  . amphetamine-dextroamphetamine (ADDERALL) 30 MG tablet Take 1 tablet by mouth 2 (two) times daily. 60 tablet 0  . aspirin EC 81 MG tablet Take 1 tablet (81 mg total) by mouth daily. 90 tablet 3  . atorvastatin (LIPITOR) 80 MG tablet Take 1 tablet (80 mg total) by mouth daily. 90 tablet 3  . azithromycin (ZITHROMAX) 250 MG tablet 2 tabs day one, then one tab days 2-5 6 tablet 0  . BD PEN NEEDLE NANO U/F 32G X 4 MM MISC USE AS DIRECTED WITH VICTOZA 100 each 1  . canagliflozin (INVOKANA) 300 MG TABS tablet Take 1 tablet (300 mg total) by mouth daily before  breakfast. Patient needs office visit for further refills. 30 tablet 0  . DULoxetine (CYMBALTA) 60 MG capsule Take 1 capsule (60 mg total) by mouth at bedtime. 30 capsule 2  . fluticasone (FLONASE) 50 MCG/ACT nasal spray USE 1 SPRAY IN EACH NOSTRIL TWICE DAILY AFTER SINUS RINSES 16 g 2  . Fluticasone-Salmeterol (ADVAIR DISKUS) 250-50 MCG/DOSE AEPB Inhale 1 puff into the lungs 2 (two) times daily. Flush mouth after each use 1 each 1  . isosorbide mononitrate (IMDUR) 30 MG 24 hr tablet Take 1 tablet (30 mg total) by mouth daily. 90 tablet 3  . losartan (COZAAR) 25 MG tablet Take 1 tablet (25 mg total) by mouth daily. Patient needs office visit for further refills. 30 tablet 0  . losartan-hydrochlorothiazide (HYZAAR) 100-12.5 MG tablet TAKE 1 TABLET BY MOUTH DAILY. 60 tablet 0  . meclizine (ANTIVERT) 25 MG tablet Take 1 tablet (25 mg total) by mouth 3 (three) times daily as needed for dizziness or nausea. 30 tablet 0  . methocarbamol (ROBAXIN-750) 750 MG tablet Take 1 tablet (750 mg total) by mouth 4 (four) times daily. 30 tablet 0  . naproxen (NAPROSYN) 500 MG tablet Take 1 tablet (500 mg total) by mouth 2 (two) times daily. 30 tablet 0  .  omeprazole (PRILOSEC) 20 MG capsule TAKE 1 CAPSULE BY MOUTH DAILY FOLLOWING LARGEST MEAL. (Patient taking differently: Take 20 mg by mouth daily. ) 90 capsule 0  . pantoprazole (PROTONIX) 40 MG tablet Take 1 tablet (40 mg total) by mouth daily. 30 minutes before a meal. (Patient taking differently: Take by mouth daily. 30 minutes before a meal.) 30 tablet 1  . pregabalin (LYRICA) 75 MG capsule Take 1 capsule (75 mg total) by mouth 3 (three) times daily. 90 capsule 0  . VICTOZA 18 MG/3ML SOPN INJECT 0.3 MLS (1.8 MG TOTAL) INTO THE SKIN DAILY. (Patient taking differently: Inject 1.8 mg into the skin daily. ) 27 mL 1  . Vitamin D, Ergocalciferol, (DRISDOL) 50000 units CAPS capsule Take 1 capsule (50,000 Units total) by mouth every 7 (seven) days. 12 capsule 10   Current  Facility-Administered Medications  Medication Dose Route Frequency Provider Last Rate Last Dose  . ipratropium-albuterol (DUONEB) 0.5-2.5 (3) MG/3ML nebulizer solution 3 mL  3 mL Nebulization Q6H Opalski, Alvera Tourigny, DO   3 mL at 01/10/18 1042     Musculoskeletal: Strength & Muscle Tone: within normal limits Gait & Station: normal Patient leans: N/A  Psychiatric Specialty Exam: Review of Systems  Neurological: Positive for tingling.  All other systems reviewed and are negative.   There were no vitals taken for this visit.There is no height or weight on file to calculate BMI.  General Appearance: NA  Eye Contact:  NA  Speech:  Clear and Coherent  Volume:  Normal  Mood:  Euthymic  Affect:  NA  Thought Process:  Goal Directed  Orientation:  Full (Time, Place, and Person)  Thought Content: WDL   Suicidal Thoughts:  No  Homicidal Thoughts:  No  Memory:  Immediate;   Good Recent;   Good Remote;   Good  Judgement:  Good  Insight:  Fair  Psychomotor Activity:  Normal  Concentration:  Concentration: Good and Attention Span: Good  Recall:  Good  Fund of Knowledge: Good  Language: Good  Akathisia:  No  Handed:  Right  AIMS (if indicated): not done  Assets:  Communication Skills Desire for Improvement Resilience Social Support Talents/Skills  ADL's:  Intact  Cognition: WNL  Sleep:  Good   Screenings: AIMS     Admission (Discharged) from 09/12/2014 in [place] [facility]  AIMS Total Score  0    AUDIT     Admission (Discharged) from 09/12/2014 in [place] [facility]  Alcohol Use Disorder Identification Test Final Score (AUDIT)  0    PHQ2-9     Office Visit from 09/24/2018 in [place] at [facility] Visit from 07/09/2018 in [place] at [facility] Visit from 03/11/2018 in [place] at [facility] Visit from 01/10/2018 in [place] at [facility] Visit from 12/07/2017 in [place] at [facility] Total Score  2  3  0  2  2  PHQ-9 Total Score  9  12  3  7  10        Assessment and Plan:  This patient is a 53 year old female with a history of depression anxiety and ADD.  She continues to do well.  She will continue Cymbalta 60 mg daily for depression, Xanax 0.5 mg daily as needed for anxiety and Adderall 30 mg twice daily for ADD.  She will return to see me in 3  months  [name], MD 10/07/2018, 1:16 PM

## 2018-10-18 MED ORDER — CANAGLIFLOZIN 300 MG PO TABS: 300.0000 mg | ORAL_TABLET | Freq: Every day | ORAL | 0 refills | Status: DC

## 2018-10-18 MED ORDER — PREGABALIN 75 MG PO CAPS: 75.0000 mg | ORAL_CAPSULE | Freq: Three times a day (TID) | ORAL | 0 refills | Status: DC

## 2018-10-18 MED ORDER — FLUTICASONE-SALMETEROL 250-50 MCG/DOSE IN AEPB: 1.0000 | INHALATION_SPRAY | Freq: Two times a day (BID) | RESPIRATORY_TRACT | 1 refills | Status: DC

## 2018-10-18 MED ORDER — VICTOZA 18 MG/3ML ~~LOC~~ SOPN: PEN_INJECTOR | SUBCUTANEOUS | 1 refills | Status: DC

## 2018-10-18 MED ORDER — OMEPRAZOLE 20 MG PO CPDR: DELAYED_RELEASE_CAPSULE | ORAL | 0 refills | Status: DC

## 2018-11-04 ENCOUNTER — Other Ambulatory Visit: Payer: Self-pay | Admitting: Family Medicine

## 2018-11-04 DIAGNOSIS — E1142 Type 2 diabetes mellitus with diabetic polyneuropathy: Secondary | ICD-10-CM

## 2018-11-05 ENCOUNTER — Encounter: Payer: Self-pay | Admitting: Family Medicine

## 2018-11-06 ENCOUNTER — Other Ambulatory Visit: Payer: Self-pay

## 2018-11-06 DIAGNOSIS — R1319 Other dysphagia: Secondary | ICD-10-CM

## 2018-11-06 DIAGNOSIS — E1142 Type 2 diabetes mellitus with diabetic polyneuropathy: Secondary | ICD-10-CM

## 2018-11-06 DIAGNOSIS — K219 Gastro-esophageal reflux disease without esophagitis: Secondary | ICD-10-CM

## 2018-11-06 DIAGNOSIS — E1129 Type 2 diabetes mellitus with other diabetic kidney complication: Secondary | ICD-10-CM

## 2018-11-06 DIAGNOSIS — R1310 Dysphagia, unspecified: Secondary | ICD-10-CM

## 2018-11-06 DIAGNOSIS — R809 Proteinuria, unspecified: Secondary | ICD-10-CM

## 2018-11-06 DIAGNOSIS — E1159 Type 2 diabetes mellitus with other circulatory complications: Secondary | ICD-10-CM

## 2018-11-06 DIAGNOSIS — J449 Chronic obstructive pulmonary disease, unspecified: Secondary | ICD-10-CM

## 2018-11-06 DIAGNOSIS — E1143 Type 2 diabetes mellitus with diabetic autonomic (poly)neuropathy: Secondary | ICD-10-CM

## 2018-11-06 MED ORDER — OMEPRAZOLE 20 MG PO CPDR: DELAYED_RELEASE_CAPSULE | ORAL | 0 refills | Status: DC

## 2018-11-06 MED ORDER — CANAGLIFLOZIN 300 MG PO TABS: 300.0000 mg | ORAL_TABLET | Freq: Every day | ORAL | 0 refills | Status: DC

## 2018-11-06 MED ORDER — FLUTICASONE PROPIONATE 50 MCG/ACT NA SUSP: NASAL | 2 refills | Status: DC

## 2018-11-06 MED ORDER — FLUTICASONE-SALMETEROL 250-50 MCG/DOSE IN AEPB: 1.0000 | INHALATION_SPRAY | Freq: Two times a day (BID) | RESPIRATORY_TRACT | 0 refills | Status: DC

## 2018-11-06 MED ORDER — PREGABALIN 75 MG PO CAPS: 75.0000 mg | ORAL_CAPSULE | Freq: Three times a day (TID) | ORAL | 0 refills | Status: DC

## 2018-11-06 MED ORDER — LOSARTAN POTASSIUM 25 MG PO TABS: 25.0000 mg | ORAL_TABLET | Freq: Every day | ORAL | 0 refills | Status: DC

## 2018-11-06 NOTE — Telephone Encounter (Signed)
Okay per Dr. Raliegh Scarlet to refill for 30 days for patient to est with new PCP. MPulliam, CMA/RT(R)

## 2018-11-06 NOTE — Telephone Encounter (Signed)
Ok for ONLY a 30 d supply only no RF  ----- Message -----  From: Jerilee Field, CMA  Sent: 11/05/2018  5:23 PM EDT  To: Mellody Dance, DO  Subject: FW: Non-Urgent Medical Question           LOV 09/24/2018 advised to f/u 3-4 weeks for OV with [place] or new PCP. New PCP appt 11/21/2018 please advise if okay to refill for 30 days until patient can get in with their office.   ----- Message -----  From: [name]  Sent: 11/05/2018  3:20 PM EDT  To: [facility]  Subject: Non-Urgent Medical Question             I'm trying to get refills sent to [facility] on [employer] but some of them need approval by you. I was told that my Lyrica was not approved, which they have filled before and all the meds that I am getting transferred from [facility] that are out of refills have not been approved either. I have made an appointment to see Dr. Letta Median at [place] [facility] [place] for July 30 at 10:30 am, but I need my refills before then. Can you please approve the request until I can get established with new doctor? I'm feeling much better than the last time I saw you. The only reason I am changing doctors is because I moved and need a closer location. I think you are a great doctor and will miss your upbeat and straight forward attitude.    Thank you,  [name]

## 2018-11-06 NOTE — Progress Notes (Signed)
Resent to correct pharmacy - pervious canceled out

## 2018-11-07 ENCOUNTER — Other Ambulatory Visit: Payer: Self-pay

## 2018-11-07 DIAGNOSIS — E1142 Type 2 diabetes mellitus with diabetic polyneuropathy: Secondary | ICD-10-CM

## 2018-11-07 MED ORDER — PREGABALIN 75 MG PO CAPS: 75.0000 mg | ORAL_CAPSULE | Freq: Three times a day (TID) | ORAL | 0 refills | Status: DC

## 2018-11-07 NOTE — Progress Notes (Signed)
RX sent to pharmacy - resent to [facility] and canceled the one sent to [facility]. MPulliam, CMA/RT(R)

## 2018-11-21 ENCOUNTER — Telehealth: Payer: Self-pay

## 2018-11-21 ENCOUNTER — Encounter: Payer: Self-pay | Admitting: Family Medicine

## 2018-11-21 ENCOUNTER — Ambulatory Visit (INDEPENDENT_AMBULATORY_CARE_PROVIDER_SITE_OTHER): Payer: Medicare HMO | Admitting: Family Medicine

## 2018-11-21 ENCOUNTER — Encounter: Payer: Self-pay | Admitting: Gastroenterology

## 2018-11-21 VITALS — BP 120/78 | HR 91 | Temp 98.1°F | Ht 64.0 in | Wt 253.0 lb

## 2018-11-21 DIAGNOSIS — N950 Postmenopausal bleeding: Secondary | ICD-10-CM

## 2018-11-21 DIAGNOSIS — K219 Gastro-esophageal reflux disease without esophagitis: Secondary | ICD-10-CM | POA: Diagnosis not present

## 2018-11-21 DIAGNOSIS — Z1211 Encounter for screening for malignant neoplasm of colon: Secondary | ICD-10-CM | POA: Diagnosis not present

## 2018-11-21 DIAGNOSIS — R1310 Dysphagia, unspecified: Secondary | ICD-10-CM

## 2018-11-21 DIAGNOSIS — R1319 Other dysphagia: Secondary | ICD-10-CM

## 2018-11-21 DIAGNOSIS — E1142 Type 2 diabetes mellitus with diabetic polyneuropathy: Secondary | ICD-10-CM

## 2018-11-21 DIAGNOSIS — Z862 Personal history of diseases of the blood and blood-forming organs and certain disorders involving the immune mechanism: Secondary | ICD-10-CM | POA: Diagnosis not present

## 2018-11-21 LAB — CBC
HCT: 43.7 % (ref 36.0–46.0)
Hemoglobin: 14 g/dL (ref 12.0–15.0)
MCHC: 32.1 g/dL (ref 30.0–36.0)
MCV: 80 fl (ref 78.0–100.0)
Platelets: 300 10*3/uL (ref 150.0–400.0)
RBC: 5.46 Mil/uL — ABNORMAL HIGH (ref 3.87–5.11)
RDW: 17.7 % — ABNORMAL HIGH (ref 11.5–15.5)
WBC: 12.2 10*3/uL — ABNORMAL HIGH (ref 4.0–10.5)

## 2018-11-21 MED ORDER — PREGABALIN 150 MG PO CAPS: 150.0000 mg | ORAL_CAPSULE | Freq: Two times a day (BID) | ORAL | 1 refills | Status: DC

## 2018-11-21 MED ORDER — PANTOPRAZOLE SODIUM 40 MG PO TBEC: 40.0000 mg | DELAYED_RELEASE_TABLET | Freq: Every day | ORAL | 3 refills | Status: DC

## 2018-11-21 NOTE — Progress Notes (Addendum)
[name] is a 53 y.o. female  Chief Complaint  Patient presents with  . Establish Care    est care/ pt stats started period after not having one 3 years cramping and lower back pain/ referral OB    HPI: [name] is a 53 y.o. female here to establish care with our office. She complains of lower abdominal cramping, lower back pain x 1 week and vaginal bleeding since Sun (4 days). She endorses fatigue, lightheadedness at time usually with position change (losartan was decreased to 25mg  in 02/2018 d/t this and symptoms improved). No fever, chills. Appetite is good. No weight loss. No night sweats. She has not had a period in 3 years. She had an ablation in 2014 d/t heavy bleeding. She states cycles improved (lighter). She notes a h/o ovarian cysts.   Pt also needs refill of her lyrica which she takes for neuropathy related to her DM. She states current dose is somewhat effective but would like to increase dose to 150mg  BID. She has been on this dose previously but then tried to decrease dose w/ previous PCP. Not as effective and she is therefore not sleeping as well, symptoms most bothersome at night when trying to fall asleep.  Pt also complains of GERD x years. Minimal improvement with H2 blocker, some symptom improvement with PPI but still takes Tums daily while on protonix 40mg  daily. She also has h/o intermittent dysphagia. No weight loss. Pt is due for colonoscopy.   Specialists: pulmonary (Dr. Elsworth Soho - [facility]), psych (Dr. [name])  Last PAP: ? Last colonoscopy: 2015   Past Medical History:  Diagnosis Date  . Anemia   . Anxiety   . Asthmatic bronchitis    normal PFT/ seen by pulmonary no evidence of COPD  . Atypical chest pain 10/05/2017  . Chest pain on respiration 03/25/2014  . Chronic bronchitis ([place])   . Chronic lower back pain   . Chronic respiratory failure ([place]) 10/16/2011   Newly 02 dep 24/7 p discharge from [facility] 01/2013  -  03/13/2014  Walked RA  2 laps @ 185 ft each stopped due to  Sob/ aching in legs, thirsty/ no desat @ slow pace   . Chronic respiratory failure with hypoxia (HCC)    On 2-3 L of oxygen at home  . Cigarette smoker 12/02/2010   Followed in Pulmonary clinic/ [facility]/ [place]   - Limits of effective care reviewed 12/22/2011    . COPD (chronic obstructive pulmonary disease) ([place])   . COPD with chronic bronchitis ([place]) 10/01/2017  . Daily headache   . Depression   . Diabetic peripheral neuropathy ([place])   . DM (diabetes mellitus) type II controlled, neurological manifestation ([place]) 12/02/2010  . Gastric erosions    EGD 08/2010.  [name] GERD (gastroesophageal reflux disease)   . H/O drug abuse ([place]) 11/12/2017   -- scanned document from outside source: [facility] in [place] which showed UDS positive for Adderall /amphetamine usage as well as positive urine for THC.  This test result was collected 08/11/2016 and reported 10/07/2016. - lso review of the chart shows another positive amphetamine, THC and METH in the urine back on 10/18/2015 under "care everywhere". ---So   . Heavy menses   . High cholesterol   . History of blood transfusion    "related to low HgB" (10/05/2017)  . History of hiatal hernia   . HTN (hypertension)   . Hyperlipidemia associated with  type 2 diabetes mellitus ([place]) 10/18/2015  . Hypertension associated with diabetes ([place]) 12/02/2010   D/c acei 12/22/2011 due to psuedowheeze and narcotic dependent cough> ? Improved - 89/38/1017 started bystolic in place of cozar due to cough    . Increased urinary protein excretion   . Internal hemorrhoids    Colonoscopy 5/12.  . Iron deficiency anemia 10/01/2017  . Mixed diabetic hyperlipidemia associated with type 2 diabetes mellitus ([place]) 10/01/2017  . On home oxygen therapy    "5L at night" (10/05/2017)  . OSA on CPAP   . Osteoarthritis    "back" (10/05/2017)  . Oxygen dependent 10/16/2011  . Pneumonia     "lots of times" (10/05/2017)  . Poorly controlled diabetes mellitus ([place]) 11/12/2017  . Pulmonary infiltrates 12/22/2011   Followed in Pulmonary clinic/ [facility]/ [place]    - See CT Chest 05/05/11   . Shortness of breath   . Tachycardia    never had test done since no insurance  . Tobacco use disorder-current smoker greater than 40-pack-year history- since age 61 1 ppd 10/01/2017  . Type II diabetes mellitus ([place])     Past Surgical History:  Procedure Laterality Date  . CESAREAN SECTION  1988; 1989  . CHOLECYSTECTOMY OPEN  1990  . COLONOSCOPY  09/16/2010   PZW:CHENID colon/small internal hemorrhoids  . ESOPHAGOGASTRODUODENOSCOPY  09/16/2010   SLF: normal/mild gastritis  . ESOPHAGOGASTRODUODENOSCOPY N/A 10/19/2014   Procedure: ESOPHAGOGASTRODUODENOSCOPY (EGD);  Surgeon: Danie Binder, MD;  Location: [facility] SUITE;  Service: Endoscopy;  Laterality: N/A;  830  . FRACTURE SURGERY    . HYSTEROSCOPY WITH THERMACHOICE  01/17/2012   Procedure: HYSTEROSCOPY WITH THERMACHOICE;  Surgeon: Florian Buff, MD;  Location: [facility] ORS;  Service: Gynecology;  Laterality: N/A;  total therapy time: 9:13sec  D5W  18 ml in, D5W   13ml out, temperture 87degrees celcious  . KIDNEY SURGERY     as child for blockages  . TONSILLECTOMY    . TUBAL LIGATION  1989  . TYMPANOSTOMY TUBE PLACEMENT Bilateral    "several times when I was a child"  . uterine ablation    . WRIST FRACTURE SURGERY Left 1995    Social History   Socioeconomic History  . Marital status: Married    Spouse name: Not on file  . Number of children: 2  . Years of education: Not on file  . Highest education level: Not on file  Occupational History  . Occupation: unemployed  Social Needs  . Financial resource strain: Not on file  . Food insecurity    Worry: Not on file    Inability: Not on file  . Transportation needs    Medical: Not on file    Non-medical: Not on file  Tobacco Use  . Smoking status: Current Every Day Smoker     Packs/day: 1.00    Years: 39.00    Pack years: 39.00    Types: Cigarettes  . Smokeless tobacco: Never Used  Substance and Sexual Activity  . Alcohol use: Yes    Comment: 10/05/2017 "5 - 8 shots tequilia//month"  . Drug use: Yes    Types: Marijuana    Comment: 10/05/2017 "a few times/wk"  . Sexual activity: Yes    Birth control/protection: Surgical  Lifestyle  . Physical activity    Days per week: Not on file    Minutes per session: Not on file  . Stress: Not on file  Relationships  . Social Herbalist on phone:  Not on file    Gets together: Not on file    Attends religious service: Not on file    Active member of club or organization: Not on file    Attends meetings of clubs or organizations: Not on file    Relationship status: Not on file  . Intimate partner violence    Fear of current or ex partner: Not on file    Emotionally abused: Not on file    Physically abused: Not on file    Forced sexual activity: Not on file  Other Topics Concern  . Not on file  Social History Narrative  . Not on file    Family History  Problem Relation Age of Onset  . Heart attack Father 88       deceased, etoh use  . Heart disease Father   . Alcohol abuse Father   . Depression Father   . Heart attack Mother 78       deceased  . Diabetes Mother   . Breast cancer Mother   . Heart failure Mother        oxygen dependence, nonsmoker  . Heart disease Mother   . Depression Mother   . Cancer Mother   . Liver disease Maternal Aunt 3       died while on liver transplant list  . Heart attack Maternal Grandmother        premature CAD  . Ulcers Sister   . Hypertension Sister   . Colon cancer Neg Hx      Immunization History  Administered Date(s) Administered  . Influenza Split 01/11/2012, 04/08/2016  . Influenza,inj,Quad PF,6+ Mos 12/31/2012, 01/16/2014, 03/10/2015, 03/11/2018  . Influenza-Unspecified 01/29/2017  . Pneumococcal Polysaccharide-23 02/20/2012  . Tdap  03/11/2018    Outpatient Encounter Medications as of 11/21/2018  Medication Sig  . acetaminophen (TYLENOL) 500 MG tablet Take 1,000 mg by mouth every 6 (six) hours as needed for mild pain or headache.  . ALPRAZolam (XANAX) 0.5 MG tablet Take 1 tablet (0.5 mg total) by mouth daily as needed for anxiety.  [name] amphetamine-dextroamphetamine (ADDERALL) 30 MG tablet Take 1 tablet by mouth 2 (two) times daily.  [name] amphetamine-dextroamphetamine (ADDERALL) 30 MG tablet Take 1 tablet by mouth 2 (two) times daily.  [name] amphetamine-dextroamphetamine (ADDERALL) 30 MG tablet Take 1 tablet by mouth 2 (two) times daily.  [name] aspirin EC 81 MG tablet Take 1 tablet (81 mg total) by mouth daily.  [name] atorvastatin (LIPITOR) 80 MG tablet Take 1 tablet (80 mg total) by mouth daily.  . BD PEN NEEDLE NANO U/F 32G X 4 MM MISC USE AS DIRECTED WITH VICTOZA  . canagliflozin (INVOKANA) 300 MG TABS tablet Take 1 tablet (300 mg total) by mouth daily before breakfast. Patient needs office visit for further refills.  . DULoxetine (CYMBALTA) 60 MG capsule Take 1 capsule (60 mg total) by mouth at bedtime.  . fluticasone (FLONASE) 50 MCG/ACT nasal spray USE 1 SPRAY IN EACH NOSTRIL TWICE DAILY AFTER SINUS RINSES  . Fluticasone-Salmeterol (ADVAIR DISKUS) 250-50 MCG/DOSE AEPB Inhale 1 puff into the lungs 2 (two) times daily. Flush mouth after each use- NO RF's NEEDS OV  . ibuprofen (ADVIL) 200 MG tablet Take 200 mg by mouth every 6 (six) hours as needed.  . isosorbide mononitrate (IMDUR) 30 MG 24 hr tablet Take 1 tablet (30 mg total) by mouth daily.  [name] liraglutide (VICTOZA) 18 MG/3ML SOPN INJECT 0.3 MLS (1.8 MG TOTAL) INTO THE SKIN DAILY.  No refills until office  visit!!  . losartan (COZAAR) 25 MG tablet Take 1 tablet (25 mg total) by mouth daily. Patient needs office visit for further refills.  . meclizine (ANTIVERT) 25 MG tablet Take 1 tablet (25 mg total) by mouth 3 (three) times daily as needed for dizziness or nausea.  . naproxen  (NAPROSYN) 500 MG tablet Take 1 tablet (500 mg total) by mouth 2 (two) times daily.  [name] omeprazole (PRILOSEC) 20 MG capsule TAKE 1 CAPSULE BY MOUTH DAILY AFTER LARGEST MEAL.  . pregabalin (LYRICA) 75 MG capsule Take 1 capsule (75 mg total) by mouth 3 (three) times daily. No ReFills  . Vitamin D, Ergocalciferol, (DRISDOL) 50000 units CAPS capsule Take 1 capsule (50,000 Units total) by mouth every 7 (seven) days.  . methocarbamol (ROBAXIN-750) 750 MG tablet Take 1 tablet (750 mg total) by mouth 4 (four) times daily. (Patient not taking: Reported on 11/21/2018)  . pantoprazole (PROTONIX) 40 MG tablet Take 1 tablet (40 mg total) by mouth daily. 30 minutes before a meal. (Patient not taking: Reported on 11/21/2018)  . [DISCONTINUED] azithromycin (ZITHROMAX) 250 MG tablet 2 tabs day one, then one tab days 2-5  . [DISCONTINUED] losartan-hydrochlorothiazide (HYZAAR) 100-12.5 MG tablet TAKE 1 TABLET BY MOUTH DAILY.   Facility-Administered Encounter Medications as of 11/21/2018  Medication  . ipratropium-albuterol (DUONEB) 0.5-2.5 (3) MG/3ML nebulizer solution 3 mL     ROS: Pertinent positives and negatives noted in HPI. Remainder of ROS non-contributory    Allergies  Allergen Reactions  . Codeine Itching and Nausea Only  . Metformin And Related Diarrhea and Other (See Comments)    Stomach pain/nausea  . Wellbutrin [Bupropion Hcl] Hives  . Acyclovir And Related Rash    BP 120/78   Pulse 91   Temp 98.1 F (36.7 C) (Oral)   Ht 5\' 4"  (1.626 m)   Wt 253 lb (114.8 kg)   SpO2 97%   BMI 43.43 kg/m   Physical Exam  Constitutional: She is oriented to person, place, and time. She appears well-developed and well-nourished.  Cardiovascular: Normal rate and regular rhythm.  Pulmonary/Chest: Effort normal and breath sounds normal.  Abdominal: Soft. Bowel sounds are normal. She exhibits no distension and no mass. There is no abdominal tenderness. There is no rebound and no guarding.  Musculoskeletal:         General: No edema.  Neurological: She is alert and oriented to person, place, and time.  Skin: Skin is warm and dry.  Psychiatric: She has a normal mood and affect. Her behavior is normal.     A/P:  1. Post-menopausal bleeding - US Pelvic Complete With Transvaginal; Future - Iron, TIBC and Ferritin Panel - CBC - Ambulatory referral to Gynecology  2. Gastroesophageal reflux disease without esophagitis - chronic, not well-controlled even on PPI. Previously tried H2 blocker which was not effective. She takes Tums 4-6+ tabs per day.  - Ambulatory referral to Gastroenterology Refill: - pantoprazole (PROTONIX) 40 MG tablet; Take 1 tablet (40 mg total) by mouth daily. 30 minutes before a meal.  Dispense: 90 tablet; Refill: 3  3. Esophageal dysphagia - Ambulatory referral to Gastroenterology  4. Screening for colon cancer - Ambulatory referral to Gastroenterology  5. History of iron deficiency anemia - Iron, TIBC and Ferritin Panel - CBC  6. Diabetic peripheral neuropathy (HCC) Refill: - pregabalin (LYRICA) 150 MG capsule; Take 1 capsule (150 mg total) by mouth 2 (two) times daily. No ReFills  Dispense: 180 capsule; Refill: 1

## 2018-11-21 NOTE — Patient Instructions (Addendum)
[facility] - Dr. Warren Danes

## 2018-11-21 NOTE — Telephone Encounter (Signed)
Questions for Screening COVID-19  Symptom onset: None  Travel or Contacts: None  During this illness, did/does the patient experience any of the following symptoms? Fever >100.64F []   Yes [x]   No []   Unknown Subjective fever (felt feverish) []   Yes [x]   No []   Unknown Chills []   Yes [x]   No []   Unknown Muscle aches (myalgia) []   Yes [x]   No []   Unknown Runny nose (rhinorrhea) []   Yes [x]   No []   Unknown Sore throat []   Yes [x]   No []   Unknown Cough (new onset or worsening of chronic cough) []   Yes [x]   No []   Unknown Shortness of breath (dyspnea) []   Yes [x]   No []   Unknown Nausea or vomiting []   Yes [x]   No []   Unknown Headache []   Yes []   No []   Unknown Abdominal pain  []   Yes [x]   No []   Unknown Diarrhea (?3 loose/looser than normal stools/24hr period) []   Yes [x]   No []   Unknown Other, specify:  Patient risk factors: Smoker? []   Current []   Former []   Never If female, currently pregnant? []   Yes []   No  Patient Active Problem List   Diagnosis Date Noted  . Generalized muscle ache 09/24/2018  . Personal history of noncompliance with medical treatment, presenting hazards to health 09/24/2018  . SOB (shortness of breath) 07/09/2018  . Fever, low grade 07/09/2018  . Cough in adult 07/09/2018  . Diabetes mellitus ([place]) 03/11/2018  . Chronic right ear pain 03/11/2018  . Chronic sinusitis 03/11/2018  . H/O drug abuse ([place]) 11/12/2017  . Positive for macroalbuminuria 11/12/2017  . h/o freq Vaginal yeast infections 11/12/2017  . Poorly controlled diabetes mellitus ([place]) 11/12/2017  . Atypical chest pain 10/05/2017  . COPD with chronic bronchitis ([place]) 10/01/2017  . Mixed diabetic hyperlipidemia associated with type 2 diabetes mellitus ([place]) 10/01/2017  . Tobacco use disorder-current smoker greater than 40-pack-year history- since age 48 1 ppd 10/01/2017  . Tobacco abuse counseling 10/01/2017  . Diabetic peripheral neuropathy ([place]) 10/01/2017  . Iron deficiency anemia 10/01/2017   . Adult attention deficit hyperactivity disorder 01/14/2016  . Gastric reflux 10/18/2015  . Dyspepsia 09/23/2014  . Major depressive disorder, recurrent, severe without psychotic features ([place])   . MDD (major depressive disorder), recurrent episode, severe ([place]) 09/13/2014  . Nasal congestion 05/03/2014  . Chest pain on respiration 03/25/2014  . Leukocytosis 03/25/2014  . Upper airway cough syndrome 02/28/2014  . Abnormal drug screen 04/11/2013  . OSA on CPAP 04/11/2013  . Perforated ear drum 05/22/2012  . Bell's palsy 01/23/2012  . Carpal tunnel syndrome 01/23/2012  . Carpal tunnel syndrome on both sides 01/11/2012  . Pulmonary infiltrates 12/22/2011  . Chronic respiratory failure ([place]) 10/16/2011  . Oxygen dependent 10/16/2011  . Peripheral neuropathy 07/07/2011  . Vitamin Kalana Yust deficiency 07/06/2011  . Asthmatic bronchitis 06/11/2011  . Back pain 06/11/2011  . Morbid obesity ([place]) 05/06/2011  . Depression 05/04/2011  . Gastric erosions 12/05/2010  . Hypertension associated with diabetes ([place]) 12/02/2010  . Diabetes mellitus, type II ([place]) 12/02/2010  . Cigarette smoker 12/02/2010  . COPD with acute exacerbation ([place]) 11/30/2010  . Microcytic anemia 09/08/2010  . GERD (gastroesophageal reflux disease) 09/08/2010  . Esophageal dysphagia 09/08/2010    Plan:  []   High risk for COVID-19 with red flags go to ED (with CP, SOB, weak/lightheaded, or fever > 101.5). Call ahead.  []   High risk for COVID-19 but stable. Inform provider and coordinate  time for [facility] visit.   []   No red flags but URI signs or symptoms okay for [facility] visit.

## 2018-11-22 ENCOUNTER — Encounter: Payer: Self-pay | Admitting: Family Medicine

## 2018-11-22 LAB — IRON,TIBC AND FERRITIN PANEL
%SAT: 13 % (calc) — ABNORMAL LOW (ref 16–45)
Ferritin: 91 ng/mL (ref 16–232)
Iron: 45 ug/dL (ref 45–160)
TIBC: 337 mcg/dL (calc) (ref 250–450)

## 2018-11-26 ENCOUNTER — Other Ambulatory Visit: Payer: Self-pay

## 2018-11-26 ENCOUNTER — Ambulatory Visit: Payer: Medicare HMO | Admitting: Obstetrics & Gynecology

## 2018-11-26 ENCOUNTER — Encounter: Payer: Self-pay | Admitting: Obstetrics & Gynecology

## 2018-11-26 ENCOUNTER — Encounter: Payer: Self-pay | Admitting: Family Medicine

## 2018-11-26 VITALS — BP 134/82 | Ht 64.0 in | Wt 257.0 lb

## 2018-11-26 DIAGNOSIS — Z716 Tobacco abuse counseling: Secondary | ICD-10-CM

## 2018-11-26 DIAGNOSIS — N950 Postmenopausal bleeding: Secondary | ICD-10-CM

## 2018-11-26 DIAGNOSIS — Z6841 Body Mass Index (BMI) 40.0 and over, adult: Secondary | ICD-10-CM

## 2018-11-26 NOTE — Progress Notes (Signed)
    [name] [birth year] [id number]        53 y.o.  G2P2L2  Married x 2 years.  S/P TL.  RP: Postmenopausal bleeding x 1 1/2 week  HPI:  S/P TL.  Endometrial ablation in 2013.  Had no menses/vaginal bleeding x 2 1/2 yr, then 1 1/2 week ago started having dark red vaginal bleeding.  Pelvic cramping started 2 days before the vaginal bleeding and continued until now. No pain with IC, but postcoital cramping present.  Occasional yeast vaginitis treated with Fluconazole.  DM type 2.  CHTN.  Hypercholesterolemia.     OB History  Gravida Para Term Preterm AB Living  2 2 2     2   SAB TAB Ectopic Multiple Live Births               # Outcome Date GA Lbr Len/2nd Weight Sex Delivery Anes PTL Lv  2 Term           1 Term             Past medical history,surgical history, problem list, medications, allergies, family history and social history were all reviewed and documented in the EPIC chart.   Directed ROS with pertinent positives and negatives documented in the history of present illness/assessment and plan.  Exam:  Vitals:   11/26/18 1403  BP: 134/82  Weight: 257 lb (116.6 kg)  Height: 5\' 4"  (1.626 m)  BMI >40  General appearance:  Normal  Abdomen: Normal  Gynecologic exam:    Assessment/Plan:  53 y.o. G2P2002   1. Postmenopausal bleeding Menopausal x2-1/2 years on no hormone replacement therapy.  Postmenopausal irregular bleeding for a week and a half.  Scheduled for Pelvic US at [facility] tomorrow.  Will verify TSH, FSH and CBC today.  F/U next week to discuss results and management. - FSH - TSH - CBC  2. Tobacco abuse counseling Strongly recommended to quit smoking.  Counseling done.  3. Class 3 severe obesity due to excess calories with serious comorbidity and body mass index (BMI) of 40.0 to 44.9 in adult [facility]) Lower calorie/carb diet such as [employer].  Aerobic physical activities 5 times a week and weightlifting every 2 days.  Will schedule an  Annual/Gyn visit.  Counseling on above issues and coordination of care more than 50% for 30 minutes  [name] MD, 2:19 PM 11/26/2018

## 2018-11-27 ENCOUNTER — Ambulatory Visit (HOSPITAL_COMMUNITY)
Admission: RE | Admit: 2018-11-27 | Discharge: 2018-11-27 | Disposition: A | Discharge status: Home / Self Care | Payer: Medicare HMO | Source: Ambulatory Visit | Attending: Family Medicine | Admitting: Family Medicine

## 2018-11-27 DIAGNOSIS — N950 Postmenopausal bleeding: Secondary | ICD-10-CM | POA: Insufficient documentation

## 2018-11-27 LAB — CBC
HCT: 43.9 % (ref 35.0–45.0)
Hemoglobin: 13.9 g/dL (ref 11.7–15.5)
MCH: 25 pg — ABNORMAL LOW (ref 27.0–33.0)
MCHC: 31.7 g/dL — ABNORMAL LOW (ref 32.0–36.0)
MCV: 79 fL — ABNORMAL LOW (ref 80.0–100.0)
MPV: 9.5 fL (ref 7.5–12.5)
Platelets: 342 10*3/uL (ref 140–400)
RBC: 5.56 10*6/uL — ABNORMAL HIGH (ref 3.80–5.10)
RDW: 16 % — ABNORMAL HIGH (ref 11.0–15.0)
WBC: 14.5 10*3/uL — ABNORMAL HIGH (ref 3.8–10.8)

## 2018-11-27 LAB — TSH: TSH: 2.4 mIU/L

## 2018-11-27 LAB — FOLLICLE STIMULATING HORMONE: FSH: 25.9 m[IU]/mL

## 2018-11-29 ENCOUNTER — Encounter: Payer: Self-pay | Admitting: Family Medicine

## 2018-12-02 ENCOUNTER — Encounter: Payer: Self-pay | Admitting: Obstetrics & Gynecology

## 2018-12-02 NOTE — Patient Instructions (Signed)
1. Postmenopausal bleeding Menopausal x2-1/2 years on no hormone replacement therapy.  Postmenopausal irregular bleeding for a week and a half.  Scheduled for Pelvic US at [facility] tomorrow.  Will verify TSH, FSH and CBC today.  F/U next week to discuss results and management. - FSH - TSH - CBC  2. Tobacco abuse counseling Strongly recommended to quit smoking.  Counseling done.  3. Class 3 severe obesity due to excess calories with serious comorbidity and body mass index (BMI) of 40.0 to 44.9 in adult [facility]) Lower calorie/carb diet such as [employer].  Aerobic physical activities 5 times a week and weightlifting every 2 days.  Will schedule an Annual/Gyn visit.  [name], it was a pleasure meeting you today!  I will inform you of your results as soon as they are available.

## 2018-12-03 ENCOUNTER — Other Ambulatory Visit: Payer: Self-pay

## 2018-12-03 ENCOUNTER — Ambulatory Visit (INDEPENDENT_AMBULATORY_CARE_PROVIDER_SITE_OTHER): Payer: Medicare HMO | Admitting: Obstetrics & Gynecology

## 2018-12-03 ENCOUNTER — Encounter: Payer: Self-pay | Admitting: Obstetrics & Gynecology

## 2018-12-03 VITALS — BP 130/78

## 2018-12-03 DIAGNOSIS — R9389 Abnormal findings on diagnostic imaging of other specified body structures: Secondary | ICD-10-CM | POA: Diagnosis not present

## 2018-12-03 DIAGNOSIS — N950 Postmenopausal bleeding: Secondary | ICD-10-CM | POA: Diagnosis not present

## 2018-12-03 MED ORDER — IBUPROFEN 800 MG PO TABS: 800.0000 mg | ORAL_TABLET | Freq: Three times a day (TID) | ORAL | 0 refills | Status: DC | PRN

## 2018-12-03 NOTE — Patient Instructions (Signed)
1. Postmenopausal bleeding Endometrial line at the upper limit of normal at 5 mm on Pelvic US.  Decision therefore to proceed with an EBx.  Procedure explained.  EBx done without complication.  Management per results of EBx.    2. Thickened endometrium Endometrial line at 5 mm.  Girl Montella, it was a pleasure seeing you today!  I will inform you of your results as soon as they are available.

## 2018-12-03 NOTE — Progress Notes (Signed)
    [name] [birth year] [id number]        53 y.o.  G2P2002 Married x 2 years.  S/P TL.  RP: PMB with thickened endometrium  HPI: S/P TL.  Endometrial ablation in 2013.  Had no menses/vaginal bleeding x 2 1/2 yr, then at the end of July 2020, she started having dark red vaginal bleeding.  Pelvic cramping started 2 days before the vaginal bleeding and continued until now. No pain with IC, but postcoital cramping present.  Occasional yeast vaginitis treated with Fluconazole.  DM type 2.  CHTN.  Hypercholesterolemia.     OB History  Gravida Para Term Preterm AB Living  2 2 2     2   SAB TAB Ectopic Multiple Live Births               # Outcome Date GA Lbr Len/2nd Weight Sex Delivery Anes PTL Lv  2 Term           1 Term             Past medical history,surgical history, problem list, medications, allergies, family history and social history were all reviewed and documented in the EPIC chart.   Directed ROS with pertinent positives and negatives documented in the history of present illness/assessment and plan.  Exam:  Vitals:   12/03/18 0950  BP: 130/78   General appearance:  Normal  Pelvic US 11/27/2018:  Uterus Measurements: 7.9 x 3.8 x 4.9 cm = volume: 76 point mL.  Heterogeneous echotexture without focal myometrial mass. Cysts in the cervix, consistent with nabothian cysts. Endometrium Thickness: 5 mm.  No focal abnormality visualized. Right ovary Not seen.  Left ovary Not seen.  No abnormal free fluid.  Endometrial Biopsy:  Verbal consent obtained for Endometrial Bx.  Vulva normal.  Speculum inserted in the vagina.  Betadine prep done.  Hurricane spray of cervix.  Anterior lip of cervix grasped with a tenaculum.  Dilation of cervix with Os finder.  Endometrial Bx done with suction EBx canula.  Specimen light to moderate, sent to pathology.  All instruments removed.  No complication, well tolerated.  Advil given for uterine cramp.   Assessment/Plan:  53 y.o. G2P2002    1. Postmenopausal bleeding Endometrial line at the upper limit of normal at 5 mm on Pelvic US.  Decision therefore to proceed with an EBx.  Procedure explained.  EBx done without complication.  Management per results of EBx.    2. Thickened endometrium Endometrial line at 5 mm.  Counseling on above issues >50% x 15 minutes.  [name] MD, 10:22 AM 12/03/2018

## 2018-12-05 ENCOUNTER — Other Ambulatory Visit: Payer: Self-pay | Admitting: Family Medicine

## 2018-12-05 DIAGNOSIS — R1319 Other dysphagia: Secondary | ICD-10-CM

## 2018-12-05 DIAGNOSIS — E1159 Type 2 diabetes mellitus with other circulatory complications: Secondary | ICD-10-CM

## 2018-12-05 DIAGNOSIS — R1310 Dysphagia, unspecified: Secondary | ICD-10-CM

## 2018-12-05 DIAGNOSIS — E1129 Type 2 diabetes mellitus with other diabetic kidney complication: Secondary | ICD-10-CM

## 2018-12-05 DIAGNOSIS — K219 Gastro-esophageal reflux disease without esophagitis: Secondary | ICD-10-CM

## 2018-12-05 DIAGNOSIS — E1143 Type 2 diabetes mellitus with diabetic autonomic (poly)neuropathy: Secondary | ICD-10-CM

## 2018-12-05 LAB — PATHOLOGY REPORT

## 2018-12-05 LAB — TISSUE SPECIMEN

## 2018-12-06 ENCOUNTER — Encounter: Payer: Self-pay | Admitting: Family Medicine

## 2018-12-10 ENCOUNTER — Other Ambulatory Visit: Payer: Self-pay | Admitting: Family Medicine

## 2018-12-10 ENCOUNTER — Other Ambulatory Visit: Payer: Self-pay | Admitting: Obstetrics & Gynecology

## 2018-12-10 DIAGNOSIS — I152 Hypertension secondary to endocrine disorders: Secondary | ICD-10-CM

## 2018-12-10 DIAGNOSIS — E1129 Type 2 diabetes mellitus with other diabetic kidney complication: Secondary | ICD-10-CM

## 2018-12-10 DIAGNOSIS — N950 Postmenopausal bleeding: Secondary | ICD-10-CM

## 2018-12-10 DIAGNOSIS — E1159 Type 2 diabetes mellitus with other circulatory complications: Secondary | ICD-10-CM

## 2018-12-10 DIAGNOSIS — E1143 Type 2 diabetes mellitus with diabetic autonomic (poly)neuropathy: Secondary | ICD-10-CM

## 2018-12-11 ENCOUNTER — Other Ambulatory Visit: Payer: Self-pay

## 2018-12-11 ENCOUNTER — Other Ambulatory Visit: Payer: Self-pay | Admitting: Family Medicine

## 2018-12-11 ENCOUNTER — Encounter: Payer: Self-pay | Admitting: Family Medicine

## 2018-12-11 DIAGNOSIS — R809 Proteinuria, unspecified: Secondary | ICD-10-CM

## 2018-12-11 DIAGNOSIS — E1143 Type 2 diabetes mellitus with diabetic autonomic (poly)neuropathy: Secondary | ICD-10-CM

## 2018-12-11 DIAGNOSIS — E1129 Type 2 diabetes mellitus with other diabetic kidney complication: Secondary | ICD-10-CM

## 2018-12-11 DIAGNOSIS — E1169 Type 2 diabetes mellitus with other specified complication: Secondary | ICD-10-CM

## 2018-12-11 DIAGNOSIS — I152 Hypertension secondary to endocrine disorders: Secondary | ICD-10-CM

## 2018-12-11 DIAGNOSIS — E1159 Type 2 diabetes mellitus with other circulatory complications: Secondary | ICD-10-CM

## 2018-12-11 MED ORDER — LOSARTAN POTASSIUM 25 MG PO TABS: 25.0000 mg | ORAL_TABLET | Freq: Every day | ORAL | 1 refills | Status: DC

## 2018-12-11 MED ORDER — CANAGLIFLOZIN 300 MG PO TABS: 300.0000 mg | ORAL_TABLET | Freq: Every day | ORAL | 1 refills | Status: DC

## 2018-12-11 MED ORDER — FLUTICASONE PROPIONATE 50 MCG/ACT NA SUSP: NASAL | 2 refills | Status: DC

## 2018-12-11 MED ORDER — VICTOZA 18 MG/3ML ~~LOC~~ SOPN: PEN_INJECTOR | SUBCUTANEOUS | 1 refills | Status: DC

## 2018-12-11 MED ORDER — LOSARTAN POTASSIUM 25 MG PO TABS: 25.0000 mg | ORAL_TABLET | Freq: Every day | ORAL | 3 refills | Status: DC

## 2018-12-23 ENCOUNTER — Ambulatory Visit: Payer: Medicare HMO | Admitting: Gastroenterology

## 2018-12-23 ENCOUNTER — Other Ambulatory Visit (INDEPENDENT_AMBULATORY_CARE_PROVIDER_SITE_OTHER): Payer: Medicare HMO

## 2018-12-23 ENCOUNTER — Other Ambulatory Visit: Payer: Self-pay

## 2018-12-23 VITALS — BP 136/70 | HR 80 | Temp 97.1°F | Ht 64.0 in | Wt 265.6 lb

## 2018-12-23 DIAGNOSIS — K5900 Constipation, unspecified: Secondary | ICD-10-CM

## 2018-12-23 DIAGNOSIS — R6881 Early satiety: Secondary | ICD-10-CM

## 2018-12-23 DIAGNOSIS — K219 Gastro-esophageal reflux disease without esophagitis: Secondary | ICD-10-CM

## 2018-12-23 DIAGNOSIS — R140 Abdominal distension (gaseous): Secondary | ICD-10-CM

## 2018-12-23 LAB — IGA: IgA: 407 mg/dL — ABNORMAL HIGH (ref 68–378)

## 2018-12-23 MED ORDER — POLYETHYLENE GLYCOL 3350 17 G PO PACK: 17.0000 g | PACK | Freq: Every day | ORAL | 0 refills | Status: DC

## 2018-12-23 MED ORDER — DICYCLOMINE HCL 10 MG PO CAPS: 10.0000 mg | ORAL_CAPSULE | Freq: Three times a day (TID) | ORAL | 3 refills | Status: DC | PRN

## 2018-12-23 NOTE — Progress Notes (Signed)
HPI :  53 year old female with a history of COPD, diabetes, GERD, referred by Dr. Letta Median for GERD, abdominal discomfort, bowel changes.  She previously was followed by Dr. Barney Drain for her GI care in years past.   She endorses longstanding symptoms of reflux to include pyrosis and regurgitation dating back for years.  She has been on multiple PPIs in the past.  Previously when on omeprazole her symptoms were poorly controlled, she was switched to Protonix fairly recently as she had had good control on this regimen remotely, and states now on Protonix 40 mg once daily.  This is working quite well to control her pyrosis and she is happy with the regimen.  She denies any dysphasia.  She denies any family history of esophageal or gastric cancer.  He has had multiple EGDs in the past, the last was done in 2016 showing no evidence of Barrett's esophagus.  She had mild gastritis on exam but negative for H. Pylori.  She does have excessive belching at times with bothers her as well as some early satiety which comes and goes.  She also has significant abdominal bloating diffusely which her frequently. She is a diabetic, her last A1c is 7.7 from June of this year.  She states after having her gallbladder out years ago, she had loose stools at baseline, however reports feeling constipated for the past few months.  She will have a bowel movement once every few days if she does not take anything.  She denies any obvious blood in her stools.  She has been taking Dulcolax periodically, can cause some cramping.  She thinks she had a colonoscopy in 2016 however records show her last exam may have been in 2012.  She denies any history of colon polyps she is aware of. She denies any family history of colon cancer.  She did have some menstrual bleeding which was unusual for her in recent months.  She had a pelvic ultrasound on August 5 which showed a thickened endometrial lining.  She states she had an  endometrial biopsy which was negative.  She has taken some NSAIDs periodically but nothing significant.  She denies any recent changes in her medications.  She wears a CPAP at night and previously had use of supplemental oxygen at home for dyspnea, she reports she no longer needs that and has not used oxygen in a while.  She denies any weight loss, in fact has gained 20 pounds in recent months.  We discussed options for weight loss, she was interested in seeing a weight loss clinic.  She had a remote CT scan in June 2016 which was normal.  Echocardiogram done October 06, 2017 showed an EF of 55 to 60%.  Colonoscopy 09/20/2010 - no report on file only path sample - no polyps removed, random biopsies negative for microscopic colitis EGD 10/19/2014 - normal esophagus, mild gastritis, normal duodenum - biopsies obtained and negative, H pylori negative    Past Medical History:  Diagnosis Date   Anemia    Anxiety    Asthmatic bronchitis    normal PFT/ seen by pulmonary no evidence of COPD   Atypical chest pain 10/05/2017   Chest pain on respiration 03/25/2014   Chronic bronchitis (HCC)    Chronic lower back pain    Chronic respiratory failure ([facility]) 10/16/2011   Newly 02 dep 24/7 p discharge [facility] 01/2013  - 03/13/2014  Walked RA  2 laps @ 185 ft each stopped due to  Sob/ aching in legs, thirsty/ no desat @ slow pace    Chronic respiratory failure with hypoxia (HCC)    On 2-3 L of oxygen at home   Cigarette smoker 12/02/2010   Followed in Pulmonary clinic/ [facility]/ [place]   - Limits of effective care reviewed 12/22/2011     COPD (chronic obstructive pulmonary disease) (HCC)    COPD with chronic bronchitis (HCC) 10/01/2017   Daily headache    Depression    Diabetic peripheral neuropathy (HCC)    DM (diabetes mellitus) type II controlled, neurological manifestation ([place]) 12/02/2010   Gastric erosions    EGD 08/2010.   GERD (gastroesophageal reflux disease)    H/O drug  abuse ([place]) 11/12/2017   -- scanned document from outside source: [facility] in [place] which showed UDS positive for Adderall /amphetamine usage as well as positive urine for THC.  This test result was collected 08/11/2016 and reported 10/07/2016. - lso review of the chart shows another positive amphetamine, THC and METH in the urine back on 10/18/2015 under "care everywhere". ---So    Heavy menses    High cholesterol    History of blood transfusion    "related to low HgB" (10/05/2017)   History of hiatal hernia    HTN (hypertension)    Hyperlipidemia associated with type 2 diabetes mellitus ([place]) 10/18/2015   Hypertension associated with diabetes ([place]) 12/02/2010   D/c acei 12/22/2011 due to psuedowheeze and narcotic dependent cough> ? Improved - [id number] started bystolic in place of cozar due to cough     Increased urinary protein excretion    Internal hemorrhoids    Colonoscopy 5/12.   Iron deficiency anemia 10/01/2017   Mixed diabetic hyperlipidemia associated with type 2 diabetes mellitus ([facility]) 10/01/2017   On home oxygen therapy    "5L at night" (10/05/2017)   OSA on CPAP    Osteoarthritis    "back" (10/05/2017)   Oxygen dependent 10/16/2011   Pneumonia    "lots of times" (10/05/2017)   Poorly controlled diabetes mellitus ([place]) 11/12/2017   Pulmonary infiltrates 12/22/2011   Followed in Pulmonary clinic/ [facility]/ [place]    - See CT Chest 05/05/11    Shortness of breath    Tachycardia    never had test done since no insurance   Tobacco use disorder-current smoker greater than 40-pack-year history- since age 50 1 ppd 10/01/2017   Type II diabetes mellitus ([place])      Past Surgical History:  Procedure Laterality Date   CESAREAN SECTION  1988; [place]   COLONOSCOPY  09/16/2010   [id number] colon/small internal hemorrhoids   ESOPHAGOGASTRODUODENOSCOPY  09/16/2010   SLF: normal/mild gastritis    ESOPHAGOGASTRODUODENOSCOPY N/A 10/19/2014   Procedure: ESOPHAGOGASTRODUODENOSCOPY (EGD);  Surgeon: Danie Binder, MD;  Location: [facility];  Service: Endoscopy;  Laterality: N/A;  [place]  01/17/2012   Procedure: HYSTEROSCOPY WITH THERMACHOICE;  Surgeon: Florian Buff, MD;  Location: [facility] ORS;  Service: Gynecology;  Laterality: N/A;  total therapy time: 9:13sec  D5W  18 ml in, D5W   7ml out, temperture 87degrees celcious   KIDNEY SURGERY     as child for blockages   TONSILLECTOMY     TUBAL LIGATION  1989   TYMPANOSTOMY TUBE PLACEMENT Bilateral    "several times when I was a child"   uterine ablation     WRIST  FRACTURE SURGERY Left 1995   Family History  Problem Relation Age of Onset   Heart attack Father 71       deceased, etoh use   Heart disease Father    Alcohol abuse Father    Depression Father    Heart attack Mother 52       deceased   Diabetes Mother    Breast cancer Mother    Heart failure Mother        oxygen dependence, nonsmoker   Heart disease Mother    Depression Mother    Cancer Mother    Liver disease Maternal Aunt 36       died while on liver transplant list   Heart attack Maternal Grandmother        premature CAD   Ulcers Sister    Hypertension Sister    Colon cancer Neg Hx    Social History   Tobacco Use   Smoking status: Current Every Day Smoker    Packs/day: 1.00    Years: 39.00    Pack years: 39.00    Types: Cigarettes   Smokeless tobacco: Never Used  Substance Use Topics   Alcohol use: Yes    Comment: 10/05/2017 "5 - 8 shots tequilia//month"   Drug use: Yes    Types: Marijuana    Comment: 10/05/2017 "a few times/wk"   Current Outpatient Medications  Medication Sig Dispense Refill   acetaminophen (TYLENOL) 500 MG tablet Take 1,000 mg by mouth every 6 (six) hours as needed for mild pain or headache.     ALPRAZolam (XANAX) 0.5 MG tablet Take 1 tablet (0.5 mg  total) by mouth daily as needed for anxiety. 30 tablet 2   amphetamine-dextroamphetamine (ADDERALL) 30 MG tablet Take 1 tablet by mouth 2 (two) times daily. 60 tablet 0   aspirin EC 81 MG tablet Take 1 tablet (81 mg total) by mouth daily. 90 tablet 3   atorvastatin (LIPITOR) 80 MG tablet Take 1 tablet (80 mg total) by mouth daily. 90 tablet 3   BD PEN NEEDLE NANO U/F 32G X 4 MM MISC USE AS DIRECTED WITH VICTOZA 100 each 1   canagliflozin (INVOKANA) 300 MG TABS tablet Take 1 tablet (300 mg total) by mouth daily before breakfast. 90 tablet 1   DULoxetine (CYMBALTA) 60 MG capsule Take 1 capsule (60 mg total) by mouth at bedtime. 30 capsule 2   fluticasone (FLONASE) 50 MCG/ACT nasal spray USE 1 SPRAY IN EACH NOSTRIL TWICE DAILY AFTER SINUS RINSES 16 g 2   Fluticasone-Salmeterol (ADVAIR DISKUS) 250-50 MCG/DOSE AEPB Inhale 1 puff into the lungs 2 (two) times daily. Flush mouth after each use- NO RF's NEEDS OV 1 each 0   ibuprofen (ADVIL) 800 MG tablet Take 1 tablet (800 mg total) by mouth every 8 (eight) hours as needed. 30 tablet 0   isosorbide mononitrate (IMDUR) 30 MG 24 hr tablet Take 1 tablet (30 mg total) by mouth daily. 90 tablet 3   liraglutide (VICTOZA) 18 MG/3ML SOPN INJECT 0.3 MLS (1.8 MG TOTAL) INTO THE SKIN DAILY. 27 mL 1   losartan (COZAAR) 25 MG tablet Take 1 tablet (25 mg total) by mouth daily. 90 tablet 3   meclizine (ANTIVERT) 25 MG tablet Take 1 tablet (25 mg total) by mouth 3 (three) times daily as needed for dizziness or nausea. 30 tablet 0   pantoprazole (PROTONIX) 40 MG tablet Take 1 tablet (40 mg total) by mouth daily. 30 minutes before a meal. 90 tablet 3  pregabalin (LYRICA) 150 MG capsule Take 1 capsule (150 mg total) by mouth 2 (two) times daily. No ReFills 180 capsule 1   Vitamin D, Ergocalciferol, (DRISDOL) 50000 units CAPS capsule Take 1 capsule (50,000 Units total) by mouth every 7 (seven) days. 12 capsule 10   Current Facility-Administered Medications    Medication Dose Route Frequency Provider Last Rate Last Dose   ipratropium-albuterol (DUONEB) 0.5-2.5 (3) MG/3ML nebulizer solution 3 mL  3 mL Nebulization Q6H Opalski, Deborah, DO   3 mL at 01/10/18 1042   Allergies  Allergen Reactions   Codeine Itching and Nausea Only   Metformin And Related Diarrhea and Other (See Comments)    Stomach pain/nausea   Wellbutrin [Bupropion Hcl] Hives   Acyclovir And Related Rash     Review of Systems: All systems reviewed and negative except where noted in HPI.    US Pelvic Complete With Transvaginal  Result Date: 11/27/2018 CLINICAL DATA:  Postmenopausal bleeding EXAM: TRANSABDOMINAL AND TRANSVAGINAL ULTRASOUND OF PELVIS TECHNIQUE: Both transabdominal and transvaginal ultrasound examinations of the pelvis were performed. Transabdominal technique was performed for global imaging of the pelvis including uterus, ovaries, adnexal regions, and pelvic cul-de-sac. It was necessary to proceed with endovaginal exam following the transabdominal exam to visualize the uterus endometrium ovaries. COMPARISON:  None FINDINGS: Uterus Measurements: 7.9 x 3.8 x 4.9 cm = volume: 76 point mL. Heterogeneous echotexture without focal myometrial mass. Cysts in the cervix, consistent with nabothian cysts. Endometrium Thickness: 5 mm.  No focal abnormality visualized. Right ovary Not seen Left ovary Not seen Other findings No abnormal free fluid. IMPRESSION: Endometrial thickness of 5 mm. In the setting of post-menopausal bleeding, endometrial sampling is indicated to exclude carcinoma. If results are benign, sonohysterogram should be considered for focal lesion work-up. (Ref: Radiological Reasoning: Algorithmic Workup of Abnormal Vaginal Bleeding with Endovaginal Sonography and Sonohysterography. AJR [id number]) Electronically Signed   By: Donavan Foil M.D.   On: 11/27/2018 14:48   Lab Results  Component Value Date   WBC 14.5 (H) 11/26/2018   HGB 13.9 11/26/2018   HCT  43.9 11/26/2018   MCV 79.0 (L) 11/26/2018   PLT 342 11/26/2018    Lab Results  Component Value Date   CREATININE 0.57 09/24/2018   BUN 7 09/24/2018   NA 141 09/24/2018   K 3.7 09/24/2018   CL 96 09/24/2018   CO2 25 09/24/2018    Lab Results  Component Value Date   ALT 13 09/24/2018   AST 12 09/24/2018   ALKPHOS 69 09/24/2018   BILITOT <0.2 09/24/2018      Physical Exam: BP 136/70    Pulse 80    Temp (!) 97.1 F (36.2 C)    Ht 5\' 4"  (1.626 m)    Wt 265 lb 9.6 oz (120.5 kg)    SpO2 98%    BMI 45.59 kg/m  Constitutional: Pleasant,well-developed, female in no acute distress. HEENT: Normocephalic and atraumatic. Conjunctivae are normal. No scleral icterus. Neck supple.  Cardiovascular: Normal rate, regular rhythm.  Pulmonary/chest: Effort normal and breath sounds normal. No wheezing, rales or rhonchi. Abdominal: Soft, protuberant, nontender. . There are no masses palpable. No hepatomegaly. Extremities: no edema Lymphadenopathy: No cervical adenopathy noted. Neurological: Alert and oriented to person place and time. Skin: Skin is warm and dry. No rashes noted. Psychiatric: Normal mood and affect. Behavior is normal.   ASSESSMENT AND PLAN: 53 year old female here for new patient assessment of the following:  GERD / Early satiety - longstanding reflux, currently  pretty well controlled on Protonix 40 mg once daily.  We discussed long-term risks and benefits of chronic PPI use.  Sounds like if she is not on this regimen she is fairly symptomatic and impacts her quality of life, thus will continue the protonix for now.  She does not have a history of Barrett's esophagus, we reviewed her endoscopic history, management of her reflux is for symptom control.  We discussed alternatives to medication for reflux therapy, she is not a candidate for TIF given her current weight.  We did discuss her weight as outlined below, I think if she was able to lose some weight this would significantly  help her reflux and could reduce her PPI requirement.  Otherwise in regards to her early satiety and significant belching, she is at risk for gastroparesis and will refer her for a gastric emptying study to assess for this.  I discussed with her what this exam is and she agreed to proceed, further recommendations pending the results.  Morbid obesity - we discussed that weight loss would help her metabolic syndrome, diabetes, and reflux/general wellbeing.  Offered her referral to the [facility] weight loss clinic and she strongly wished to pursue that, referral placed  Constipation / bloating - she has some ongoing constipation in the setting of having some bloating symptoms.  Recommend we get a bit more aggressive with her bowel regimen, recommend MiraLAX at least once daily and titrate up as needed for a goal of 1 bowel movement a day.  She endorses longstanding bloating and gas which is very bothersome to her, will screen her for celiac disease with labs but I think this is unlikely to be present.  Otherwise discussed a low FODMAP diet for her as a way to reduce intestinal gas, gave her handouts about this and she will try it.  I provided some Bentyl for her to use as needed for occasional cramps.  I otherwise will get records from her last colonoscopy to clarify what the findings were and when she is next due for screening purposes.  She agreed with the plan  Cresson Cellar, MD [facility]  CC: Ronnald Nian, DO

## 2018-12-23 NOTE — Patient Instructions (Addendum)
If you are age 53 or older, your body mass index should be between 23-30. Your Body mass index is 45.59 kg/m. If this is out of the aforementioned range listed, please consider follow up with your Primary Care Provider.  If you are age 45 or younger, your body mass index should be between 19-25. Your Body mass index is 45.59 kg/m. If this is out of the aformentioned range listed, please consider follow up with your Primary Care Provider.   To help prevent the possible spread of infection to our patients, communities, and staff; we will be implementing the following measures:  As of now we are not allowing any visitors/family members to accompany you to any upcoming appointments with [facility]. If you have any concerns about this please contact our office to discuss prior to the appointment.   Continue Protonix.  Please go to the lab in the basement of our building to have lab work done as you leave today. Hit "B" for basement when you get on the elevator.  When the doors open the lab is on your left.  We will call you with the results. Thank you.  We have sent the following medications to your pharmacy for you to pick up at your convenience: Bentyl 10mg : Take every 8 hours, prn  Take Miralax every day and increase as needed.  We are giving you a Low FOD-Map Diet to follow.  We will request your records from Dr. Oneida Alar.  You have been scheduled for a gastric emptying scan at [facility] on Wednesday, 01-06-19 at 7:30am. Please arrive at least 15 minutes prior to your appointment for registration. Please make certain not to have anything to eat or drink after midnight the night before your test. Hold all stomach medications (ex: Zofran, phenergan, Reglan) 48 hours prior to your test. If you need to reschedule your appointment, please contact radiology scheduling at [phone]. _____________________________________________________________________ A gastric-emptying  study measures how long it takes for food to move through your stomach. There are several ways to measure stomach emptying. In the most common test, you eat food that contains a small amount of radioactive material. A scanner that detects the movement of the radioactive material is placed over your abdomen to monitor the rate at which food leaves your stomach. This test normally takes about 4 hours to complete. _____________________________________________________________________    Thank you for entrusting me with your care and for choosing [facility], Dr. [place] Cellar

## 2018-12-24 LAB — TISSUE TRANSGLUTAMINASE, IGA: (tTG) Ab, IgA: 1 U/mL

## 2018-12-25 ENCOUNTER — Telehealth: Payer: Self-pay

## 2018-12-25 ENCOUNTER — Other Ambulatory Visit: Payer: Self-pay

## 2018-12-25 NOTE — Telephone Encounter (Signed)
Referral made to [facility]. Patient notified that they would be calling her in a week or so, if she didn't hear from them by the end of next week to call  them. I gave her the phone #

## 2018-12-26 ENCOUNTER — Other Ambulatory Visit (HOSPITAL_COMMUNITY): Payer: Self-pay | Admitting: Psychiatry

## 2019-01-03 ENCOUNTER — Telehealth (HOSPITAL_COMMUNITY): Payer: Self-pay | Admitting: *Deleted

## 2019-01-03 ENCOUNTER — Other Ambulatory Visit (HOSPITAL_COMMUNITY): Payer: Self-pay | Admitting: Psychiatry

## 2019-01-03 MED ORDER — DULOXETINE HCL 60 MG PO CPEP: ORAL_CAPSULE | ORAL | 2 refills | Status: DC

## 2019-01-03 MED ORDER — AMPHETAMINE-DEXTROAMPHETAMINE 30 MG PO TABS: 30.0000 mg | ORAL_TABLET | Freq: Two times a day (BID) | ORAL | 0 refills | Status: DC

## 2019-01-03 MED ORDER — ALPRAZOLAM 0.5 MG PO TABS: 0.5000 mg | ORAL_TABLET | Freq: Every day | ORAL | 2 refills | Status: DC | PRN

## 2019-01-03 NOTE — Telephone Encounter (Signed)
sent 

## 2019-01-03 NOTE — Telephone Encounter (Signed)
Patient called stated that her next appt is  01/13/2019 & she will be out of her med's by then. Requested refill for all 3 of her med's Adderall, Cymbalta & Xanax

## 2019-01-06 ENCOUNTER — Encounter (HOSPITAL_COMMUNITY): Admission: RE | Admit: 2019-01-06 | Payer: Medicare HMO | Source: Ambulatory Visit

## 2019-01-13 ENCOUNTER — Encounter (HOSPITAL_COMMUNITY): Payer: Self-pay | Admitting: Psychiatry

## 2019-01-13 ENCOUNTER — Ambulatory Visit (INDEPENDENT_AMBULATORY_CARE_PROVIDER_SITE_OTHER): Payer: Medicare HMO | Admitting: Psychiatry

## 2019-01-13 ENCOUNTER — Other Ambulatory Visit: Payer: Self-pay

## 2019-01-13 DIAGNOSIS — F901 Attention-deficit hyperactivity disorder, predominantly hyperactive type: Secondary | ICD-10-CM | POA: Diagnosis not present

## 2019-01-13 DIAGNOSIS — F331 Major depressive disorder, recurrent, moderate: Secondary | ICD-10-CM

## 2019-01-13 MED ORDER — AMPHETAMINE-DEXTROAMPHETAMINE 30 MG PO TABS: 30.0000 mg | ORAL_TABLET | Freq: Two times a day (BID) | ORAL | 0 refills | Status: DC

## 2019-01-13 MED ORDER — ALPRAZOLAM 0.5 MG PO TABS: 0.5000 mg | ORAL_TABLET | Freq: Every day | ORAL | 2 refills | Status: DC | PRN

## 2019-01-13 MED ORDER — DULOXETINE HCL 60 MG PO CPEP: ORAL_CAPSULE | ORAL | 2 refills | Status: DC

## 2019-01-13 NOTE — Progress Notes (Signed)
Virtual Visit via Telephone Note  I connected with [name] on 01/13/19 at 10:40 AM EDT by telephone and verified that I am speaking with the correct person using two identifiers.   I discussed the limitations, risks, security and privacy concerns of performing an evaluation and management service by telephone and the availability of in person appointments. I also discussed with the patient that there may be a patient responsible charge related to this service. The patient expressed understanding and agreed to proceed.    I discussed the assessment and treatment plan with the patient. The patient was provided an opportunity to ask questions and all were answered. The patient agreed with the plan and demonstrated an understanding of the instructions.   The patient was advised to call back or seek an in-person evaluation if the symptoms worsen or if the condition fails to improve as anticipated.  I provided 15 minutes of non-face-to-face time during this encounter.   [name], MD  [facility] MD/PA/NP OP Progress Note  01/13/2019 10:53 AM [name]  MRN:  [id number]  Chief Complaint:  Chief Complaint    Depression; Anxiety; Follow-up     HPI: This patient is a 53 year old married female who [place] She [place] a past [place] and wants to resume care here. She is currently on disability  The patient returns after 3 months.  She is being followed for history of depression anxiety and ADHD.  She states that she is doing well.  Both her daughter and her husband's daughter and the children moved in several months ago.  She is trying to help with the 2 older granddaughters with their virtual schooling and it has been a bit of a struggle.  She is trying to keep them busy with crafts and playing outside.  She states overall her mood is good and her anxiety is under good control  and sometimes she does not need the Xanax.  She is focusing well with the Adderall.  Her energy is good and she denies any other complaints.  She did have some vaginal bleeding.  Her ultrasound and endometrial biopsy were negative so she does not need any further treatment.  The bleeding has stopped. Visit Diagnosis:    ICD-10-CM   1. Attention deficit hyperactivity disorder (ADHD), predominantly hyperactive type  F90.1   2. Moderate episode of recurrent major depressive disorder ([place])  F33.1     Past Psychiatric History: 2 psychiatric hospitalizations in her 42s.  Her last hospitalization was in 2006 for drug overdose.  Since then she has received outpatient treatment  Past Medical History:  Past Medical History:  Diagnosis Date  . Anemia   . Anxiety   . Asthmatic bronchitis    normal PFT/ seen by pulmonary no evidence of COPD  . Atypical chest pain 10/05/2017  . Chest pain on respiration 03/25/2014  . Chronic bronchitis ([place])   . Chronic lower back pain   . Chronic respiratory failure ([place]) 10/16/2011   Newly 02 dep 24/7 p discharge from [facility] 01/2013  - 03/13/2014  Walked RA  2 laps @ 185 ft each stopped due to  Sob/ aching in legs, thirsty/ no desat @ slow pace   . Chronic respiratory failure with hypoxia (HCC)    On 2-3 L of oxygen at home  . Cigarette smoker 12/02/2010   Followed in Pulmonary clinic/ [facility]/ [place]   -  Limits of effective care reviewed 12/22/2011    . COPD (chronic obstructive pulmonary disease) ([place])   . COPD with chronic bronchitis ([place]) 10/01/2017  . Daily headache   . Depression   . Diabetic peripheral neuropathy ([place])   . DM (diabetes mellitus) type II controlled, neurological manifestation ([place]) 12/02/2010  . Gastric erosions    EGD 08/2010.  [name] GERD (gastroesophageal reflux disease)   . H/O drug abuse ([place]) 11/12/2017   -- scanned document from outside source: [facility] in [place] which showed UDS positive for  Adderall /amphetamine usage as well as positive urine for THC.  This test result was collected 08/11/2016 and reported 10/07/2016. - lso review of the chart shows another positive amphetamine, THC and METH in the urine back on 10/18/2015 under "care everywhere". ---So   . Heavy menses   . High cholesterol   . History of blood transfusion    "related to low HgB" (10/05/2017)  . History of hiatal hernia   . HTN (hypertension)   . Hyperlipidemia associated with type 2 diabetes mellitus ([place]) 10/18/2015  . Hypertension associated with diabetes ([place]) 12/02/2010   D/c acei 12/22/2011 due to psuedowheeze and narcotic dependent cough> ? Improved - [id number] started bystolic in place of cozar due to cough    . Increased urinary protein excretion   . Internal hemorrhoids    Colonoscopy 5/12.  . Iron deficiency anemia 10/01/2017  . Mixed diabetic hyperlipidemia associated with type 2 diabetes mellitus ([place]) 10/01/2017  . On home oxygen therapy    "5L at night" (10/05/2017)  . OSA on CPAP   . Osteoarthritis    "back" (10/05/2017)  . Oxygen dependent 10/16/2011  . Pneumonia    "lots of times" (10/05/2017)  . Poorly controlled diabetes mellitus ([place]) 11/12/2017  . Pulmonary infiltrates 12/22/2011   Followed in Pulmonary clinic/ [facility]/ [place]    - See CT Chest 05/05/11   . Shortness of breath   . Tachycardia    never had test done since no insurance  . Tobacco use disorder-current smoker greater than 40-pack-year history- since age 35 1 ppd 10/01/2017  . Type II diabetes mellitus ([place])     Past Surgical History:  Procedure Laterality Date  . CESAREAN SECTION  1988; 1989  . CHOLECYSTECTOMY OPEN  1990  . COLONOSCOPY  09/16/2010   [id number] colon/small internal hemorrhoids  . ESOPHAGOGASTRODUODENOSCOPY  09/16/2010   SLF: normal/mild gastritis  . ESOPHAGOGASTRODUODENOSCOPY N/A 10/19/2014   Procedure: ESOPHAGOGASTRODUODENOSCOPY (EGD);  Surgeon: Danie Binder, MD;  Location: [facility] ENDO SUITE;  Service:  Endoscopy;  Laterality: N/A;  830  . FRACTURE SURGERY    . HYSTEROSCOPY WITH THERMACHOICE  01/17/2012   Procedure: HYSTEROSCOPY WITH THERMACHOICE;  Surgeon: Florian Buff, MD;  Location: [facility] ORS;  Service: Gynecology;  Laterality: N/A;  total therapy time: 9:13sec  D5W  18 ml in, D5W   47ml out, temperture 87degrees celcious  . KIDNEY SURGERY     as child for blockages  . TONSILLECTOMY    . TUBAL LIGATION  1989  . TYMPANOSTOMY TUBE PLACEMENT Bilateral    "several times when I was a child"  . uterine ablation    . WRIST FRACTURE SURGERY Left 1995    Family Psychiatric History: *see below  Family History:  Family History  Problem Relation Age of Onset  . Heart attack Father 19       deceased, etoh use  . Heart disease Father   .  Alcohol abuse Father   . Depression Father   . Heart attack Mother 37       deceased  . Diabetes Mother   . Breast cancer Mother   . Heart failure Mother        oxygen dependence, nonsmoker  . Heart disease Mother   . Depression Mother   . Cancer Mother   . Liver disease Maternal Aunt 72       died while on liver transplant list  . Heart attack Maternal Grandmother        premature CAD  . Ulcers Sister   . Hypertension Sister   . Colon cancer Neg Hx     Social History:  Social History   Socioeconomic History  . Marital status: Married    Spouse name: Not on file  . Number of children: 2  . Years of education: Not on file  . Highest education level: Not on file  Occupational History  . Occupation: unemployed  Social Needs  . Financial resource strain: Not on file  . Food insecurity    Worry: Not on file    Inability: Not on file  . Transportation needs    Medical: Not on file    Non-medical: Not on file  Tobacco Use  . Smoking status: Current Every Day Smoker    Packs/day: 1.00    Years: 39.00    Pack years: 39.00    Types: Cigarettes  . Smokeless tobacco: Never Used  Substance and Sexual Activity  . Alcohol use: Yes     Comment: 10/05/2017 "5 - 8 shots tequilia//month"  . Drug use: Yes    Types: Marijuana    Comment: 10/05/2017 "a few times/wk"  . Sexual activity: Yes    Partners: Male    Birth control/protection: Surgical  Lifestyle  . Physical activity    Days per week: Not on file    Minutes per session: Not on file  . Stress: Not on file  Relationships  . Social Herbalist on phone: Not on file    Gets together: Not on file    Attends religious service: Not on file    Active member of club or organization: Not on file    Attends meetings of clubs or organizations: Not on file    Relationship status: Not on file  Other Topics Concern  . Not on file  Social History Narrative  . Not on file    Allergies:  Allergies  Allergen Reactions  . Codeine Itching and Nausea Only  . Metformin And Related Diarrhea and Other (See Comments)    Stomach pain/nausea  . Wellbutrin [Bupropion Hcl] Hives  . Acyclovir And Related Rash    Metabolic Disorder Labs: Lab Results  Component Value Date   HGBA1C 7.7 (H) 09/24/2018   MPG 160 (H) 03/10/2015   MPG 177 (H) 08/31/2014   No results found for: PROLACTIN Lab Results  Component Value Date   CHOL 131 02/12/2018   TRIG 95 02/12/2018   HDL 38 (L) 02/12/2018   CHOLHDL 3.4 02/12/2018   VLDL 35 10/06/2017   LDLCALC 74 02/12/2018   LDLCALC 132 (H) 10/06/2017   Lab Results  Component Value Date   TSH 2.40 11/26/2018   TSH 1.630 09/24/2018    Therapeutic Level Labs: No results found for: LITHIUM No results found for: VALPROATE No components found for:  CBMZ  Current Medications: Current Outpatient Medications  Medication Sig Dispense Refill  . acetaminophen (TYLENOL) 500  MG tablet Take 1,000 mg by mouth every 6 (six) hours as needed for mild pain or headache.    . ALPRAZolam (XANAX) 0.5 MG tablet Take 1 tablet (0.5 mg total) by mouth daily as needed for anxiety. 30 tablet 2  . amphetamine-dextroamphetamine (ADDERALL) 30 MG tablet  Take 1 tablet by mouth 2 (two) times daily. 60 tablet 0  . amphetamine-dextroamphetamine (ADDERALL) 30 MG tablet Take 1 tablet by mouth 2 (two) times daily. 60 tablet 0  . amphetamine-dextroamphetamine (ADDERALL) 30 MG tablet Take 1 tablet by mouth 2 (two) times daily. 60 tablet 0  . aspirin EC 81 MG tablet Take 1 tablet (81 mg total) by mouth daily. 90 tablet 3  . atorvastatin (LIPITOR) 80 MG tablet Take 1 tablet (80 mg total) by mouth daily. 90 tablet 3  . BD PEN NEEDLE NANO U/F 32G X 4 MM MISC USE AS DIRECTED WITH VICTOZA 100 each 1  . canagliflozin (INVOKANA) 300 MG TABS tablet Take 1 tablet (300 mg total) by mouth daily before breakfast. 90 tablet 1  . dicyclomine (BENTYL) 10 MG capsule Take 1 capsule (10 mg total) by mouth every 8 (eight) hours as needed for spasms. 30 capsule 3  . DULoxetine (CYMBALTA) 60 MG capsule TAKE 1 CAPSULE(60 MG) BY MOUTH AT BEDTIME 30 capsule 2  . fluticasone (FLONASE) 50 MCG/ACT nasal spray USE 1 SPRAY IN EACH NOSTRIL TWICE DAILY AFTER SINUS RINSES 16 g 2  . Fluticasone-Salmeterol (ADVAIR DISKUS) 250-50 MCG/DOSE AEPB Inhale 1 puff into the lungs 2 (two) times daily. Flush mouth after each use- NO RF's NEEDS OV 1 each 0  . ibuprofen (ADVIL) 800 MG tablet Take 1 tablet (800 mg total) by mouth every 8 (eight) hours as needed. 30 tablet 0  . isosorbide mononitrate (IMDUR) 30 MG 24 hr tablet Take 1 tablet (30 mg total) by mouth daily. 90 tablet 3  . liraglutide (VICTOZA) 18 MG/3ML SOPN INJECT 0.3 MLS (1.8 MG TOTAL) INTO THE SKIN DAILY. 27 mL 1  . losartan (COZAAR) 25 MG tablet Take 1 tablet (25 mg total) by mouth daily. 90 tablet 3  . meclizine (ANTIVERT) 25 MG tablet Take 1 tablet (25 mg total) by mouth 3 (three) times daily as needed for dizziness or nausea. 30 tablet 0  . pantoprazole (PROTONIX) 40 MG tablet Take 1 tablet (40 mg total) by mouth daily. 30 minutes before a meal. 90 tablet 3  . polyethylene glycol (MIRALAX) 17 g packet Take 17 g by mouth daily. 14 each 0   . pregabalin (LYRICA) 150 MG capsule Take 1 capsule (150 mg total) by mouth 2 (two) times daily. No ReFills 180 capsule 1  . Vitamin D, Ergocalciferol, (DRISDOL) 50000 units CAPS capsule Take 1 capsule (50,000 Units total) by mouth every 7 (seven) days. 12 capsule 10   Current Facility-Administered Medications  Medication Dose Route Frequency Provider Last Rate Last Dose  . ipratropium-albuterol (DUONEB) 0.5-2.5 (3) MG/3ML nebulizer solution 3 mL  3 mL Nebulization Q6H Opalski, Deborah, DO   3 mL at 01/10/18 1042     Musculoskeletal: Strength & Muscle Tone: within normal limits Gait & Station: normal Patient leans: N/A  Psychiatric Specialty Exam: Review of Systems  All other systems reviewed and are negative.   There were no vitals taken for this visit.There is no height or weight on file to calculate BMI.  General Appearance: NA  Eye Contact:  NA  Speech:  Clear and Coherent  Volume:  Normal  Mood:  Euthymic  Affect:  NA  Thought Process:  Goal Directed  Orientation:  Full (Time, Place, and Person)  Thought Content: WDL   Suicidal Thoughts:  No  Homicidal Thoughts:  No  Memory:  Immediate;   Good Recent;   Good Remote;   Good  Judgement:  Good  Insight:  Fair  Psychomotor Activity:  Normal  Concentration:  Concentration: Good and Attention Span: Good  Recall:  Good  Fund of Knowledge: Good  Language: Good  Akathisia:  No  Handed:  Right  AIMS (if indicated): not done  Assets:  Communication Skills Desire for Improvement Resilience Social Support Talents/Skills  ADL's:  Intact  Cognition: WNL  Sleep:  Good   Screenings: AIMS     Admission (Discharged) from 09/12/2014 in [place] [facility]  AIMS Total Score  0    AUDIT     Admission (Discharged) from 09/12/2014 in [place] [facility]  Alcohol Use Disorder Identification Test Final Score (AUDIT)  0    PHQ2-9     Office Visit from 09/24/2018 in [place] at [facility] Visit from 07/09/2018 in [place] at [facility] Visit from 03/11/2018 in [place] at [facility] Visit from 01/10/2018 in [place] at [facility] Visit from 12/07/2017 in [place] at [facility] Total Score  2  3  0  2  2  PHQ-9 Total Score  9  12  3  7  10        Assessment and Plan: This patient is a 53 year old female with a history of depression anxiety and ADD.  She continues to do well on her current regimen.  She will continue Cymbalta 60 mg daily for depression, Xanax 0.5 mg daily as needed for anxiety and Adderall 30 mg twice daily for ADD.  She will return to see me in 3 months   [name], MD 01/13/2019, 10:53 AM

## 2019-02-05 ENCOUNTER — Encounter: Payer: Self-pay | Admitting: Obstetrics & Gynecology

## 2019-02-05 ENCOUNTER — Ambulatory Visit (INDEPENDENT_AMBULATORY_CARE_PROVIDER_SITE_OTHER): Payer: Medicare HMO | Admitting: Obstetrics & Gynecology

## 2019-02-05 ENCOUNTER — Other Ambulatory Visit: Payer: Self-pay

## 2019-02-05 VITALS — BP 138/80 | Ht 64.0 in | Wt 264.0 lb

## 2019-02-05 DIAGNOSIS — Z01419 Encounter for gynecological examination (general) (routine) without abnormal findings: Secondary | ICD-10-CM | POA: Diagnosis not present

## 2019-02-05 DIAGNOSIS — Z780 Asymptomatic menopausal state: Secondary | ICD-10-CM | POA: Diagnosis not present

## 2019-02-05 DIAGNOSIS — R9389 Abnormal findings on diagnostic imaging of other specified body structures: Secondary | ICD-10-CM | POA: Diagnosis not present

## 2019-02-05 DIAGNOSIS — Z6841 Body Mass Index (BMI) 40.0 and over, adult: Secondary | ICD-10-CM

## 2019-02-05 NOTE — Progress Notes (Signed)
[name] 08-15-1965 [id number]   History:    53 y.o. G2P2L2  Married x 2 years.  S/P TL.  RP: Established patient presenting for annual gynecologic exam  HPI:  S/P TL.  Endometrial ablation in 2013.  Had no menses/vaginal bleeding x 2 1/2 yr, then 1 1/2 week ago started having dark red vaginal bleeding in 10/2018.  Pelvic cramping started 2 days before the vaginal bleeding and continued until now. No pain with IC, but postcoital cramping present.  Had a Pelvic US and EBx 11/2018 here.  EBx was benign, but endometrial stroma/glands not identified. Will therefore repeat a Pelvic US in 3 months in 02/2019.  Occasional yeast vaginitis treated with Fluconazole. Breasts normal.  Urine/BMs normal.  BMI 45.32.  DM type 2.  CHTN.  Hypercholesterolemia. Health labs with Fam MD.    Past medical history,surgical history, family history and social history were all reviewed and documented in the EPIC chart.  Gynecologic History No LMP recorded. Patient has had an ablation. Contraception: tubal ligation Last Pap: 11/2011. Results were: Negative Last mammogram: 08/2012. Results were: Negative Bone Density: 06/2011 Normal per patient Colonoscopy: None  Obstetric History OB History  Gravida Para Term Preterm AB Living  2 2 2     2   SAB TAB Ectopic Multiple Live Births               # Outcome Date GA Lbr Len/2nd Weight Sex Delivery Anes PTL Lv  2 Term           1 Term              ROS: A ROS was performed and pertinent positives and negatives are included in the history.  GENERAL: No fevers or chills. HEENT: No change in vision, no earache, sore throat or sinus congestion. NECK: No pain or stiffness. CARDIOVASCULAR: No chest pain or pressure. No palpitations. PULMONARY: No shortness of breath, cough or wheeze. GASTROINTESTINAL: No abdominal pain, nausea, vomiting or diarrhea, melena or bright red blood per rectum. GENITOURINARY: No urinary frequency, urgency, hesitancy or  dysuria. MUSCULOSKELETAL: No joint or muscle pain, no back pain, no recent trauma. DERMATOLOGIC: No rash, no itching, no lesions. ENDOCRINE: No polyuria, polydipsia, no heat or cold intolerance. No recent change in weight. HEMATOLOGICAL: No anemia or easy bruising or bleeding. NEUROLOGIC: No headache, seizures, numbness, tingling or weakness. PSYCHIATRIC: No depression, no loss of interest in normal activity or change in sleep pattern.     Exam:   BP 138/80 (BP Location: Right Arm, Patient Position: Sitting, Cuff Size: Large)   Ht 5\' 4"  (1.626 m)   Wt 264 lb (119.7 kg)   BMI 45.32 kg/m   Body mass index is 45.32 kg/m.  General appearance : Well developed well nourished female. No acute distress HEENT: Eyes: no retinal hemorrhage or exudates,  Neck supple, trachea midline, no carotid bruits, no thyroidmegaly Lungs: Clear to auscultation, no rhonchi or wheezes, or rib retractions  Heart: Regular rate and rhythm, no murmurs or gallops Breast:Examined in sitting and supine position were symmetrical in appearance, no palpable masses or tenderness,  no skin retraction, no nipple inversion, no nipple discharge, no skin discoloration, no axillary or supraclavicular lymphadenopathy Abdomen: no palpable masses or tenderness, no rebound or guarding Extremities: no edema or skin discoloration or tenderness  Pelvic: Vulva: Normal             Vagina: No gross lesions or discharge  Cervix: No gross lesions or discharge.  Pap reflex done.  Uterus  AV, normal size, shape and consistency, non-tender and mobile  Adnexa  Without masses or tenderness  Anus: Normal  Pelvic US 11/27/2018: Uterus Measurements: 7.9 x 3.8 x 4.9 cm = volume: 76 point mL. Heterogeneous echotexture without focal myometrial mass. Cysts at the cervix, consistent with nabothian cysts. Endometrium Thickness: 5 mm.  No focal abnormality visualized. Right ovary Not seen. Left ovary Not seen. No abnormal free fluid.   Assessment/Plan:   53 y.o. female for annual exam   1. Encounter for routine gynecological examination with Papanicolaou smear of cervix Normal gynecologic exam and menopause.  Pap reflex done today.  Breast exam normal.  Will schedule a screening mammogram now.  Colonoscopy to schedule now.  Health labs with family physician.  Family physician following her diabetes type 2 and hypercholesterolemia.  2. Postmenopause Postmenopausal, well on no hormone replacement therapy.  No postmenopausal bleeding since investigation in August 2020.  A repeat pelvic ultrasound is scheduled in November 2020.  3. Thickened endometrium Benign endometrial biopsy in August 2020, but limited sample.  Pelvic ultrasound August 2020 showed an endometrial lining at 5 mm.  F/U repeat Pelvic US 02/2019.  4. Class 3 severe obesity due to excess calories with serious comorbidity and body mass index (BMI) of 45.0 to 49.9 in adult [facility]) Recommend a lower calorie/carb diet such as [employer] and aerobic physical activities 5 times a week with weightlifting every 2 days.  [name] MD, 10:52 AM 02/05/2019

## 2019-02-06 LAB — PAP IG W/ RFLX HPV ASCU

## 2019-02-07 ENCOUNTER — Other Ambulatory Visit: Payer: Self-pay | Admitting: Physician Assistant

## 2019-02-13 ENCOUNTER — Encounter: Payer: Self-pay | Admitting: Obstetrics & Gynecology

## 2019-02-13 NOTE — Patient Instructions (Signed)
1. Encounter for routine gynecological examination with Papanicolaou smear of cervix Normal gynecologic exam and menopause.  Pap reflex done today.  Breast exam normal.  Will schedule a screening mammogram now.  Colonoscopy to schedule now.  Health labs with family physician.  Family physician following her diabetes type 2 and hypercholesterolemia.  2. Postmenopause Postmenopausal, well on no hormone replacement therapy.  No postmenopausal bleeding since investigation in August 2020.  A repeat pelvic ultrasound is scheduled in November 2020.  3. Thickened endometrium Benign endometrial biopsy in August 2020, but limited sample.  Pelvic ultrasound August 2020 showed an endometrial lining at 5 mm.  F/U repeat Pelvic US 02/2019.  4. Class 3 severe obesity due to excess calories with serious comorbidity and body mass index (BMI) of 45.0 to 49.9 in adult [facility]) Recommend a lower calorie/carb diet such as [employer] and aerobic physical activities 5 times a week with weightlifting every 2 days.  [name], it was a pleasure seeing you today!  I will inform you of your results as soon as they are available.

## 2019-02-21 ENCOUNTER — Emergency Department (HOSPITAL_COMMUNITY): Payer: Medicare HMO

## 2019-02-21 ENCOUNTER — Encounter: Payer: Self-pay | Admitting: Family Medicine

## 2019-02-21 ENCOUNTER — Encounter (HOSPITAL_COMMUNITY): Payer: Self-pay | Admitting: Emergency Medicine

## 2019-02-21 ENCOUNTER — Ambulatory Visit
Admission: EM | Admit: 2019-02-21 | Discharge: 2019-02-21 | Disposition: A | Discharge status: Home / Self Care | Payer: Medicare HMO | Source: Home / Self Care

## 2019-02-21 ENCOUNTER — Ambulatory Visit (INDEPENDENT_AMBULATORY_CARE_PROVIDER_SITE_OTHER): Payer: Medicare HMO | Admitting: Family Medicine

## 2019-02-21 ENCOUNTER — Emergency Department (HOSPITAL_COMMUNITY)
Admission: EM | Admit: 2019-02-21 | Discharge: 2019-02-22 | Disposition: A | Discharge status: Home / Self Care | Payer: Medicare HMO | Attending: Emergency Medicine | Admitting: Emergency Medicine

## 2019-02-21 ENCOUNTER — Other Ambulatory Visit: Payer: Self-pay

## 2019-02-21 VITALS — HR 110 | Temp 98.8°F | Ht 64.0 in

## 2019-02-21 DIAGNOSIS — F1210 Cannabis abuse, uncomplicated: Secondary | ICD-10-CM | POA: Insufficient documentation

## 2019-02-21 DIAGNOSIS — Z79899 Other long term (current) drug therapy: Secondary | ICD-10-CM | POA: Diagnosis not present

## 2019-02-21 DIAGNOSIS — Z20828 Contact with and (suspected) exposure to other viral communicable diseases: Secondary | ICD-10-CM

## 2019-02-21 DIAGNOSIS — F17210 Nicotine dependence, cigarettes, uncomplicated: Secondary | ICD-10-CM | POA: Insufficient documentation

## 2019-02-21 DIAGNOSIS — H9201 Otalgia, right ear: Secondary | ICD-10-CM

## 2019-02-21 DIAGNOSIS — R05 Cough: Secondary | ICD-10-CM

## 2019-02-21 DIAGNOSIS — J9601 Acute respiratory failure with hypoxia: Secondary | ICD-10-CM

## 2019-02-21 DIAGNOSIS — Z20822 Contact with and (suspected) exposure to covid-19: Secondary | ICD-10-CM

## 2019-02-21 DIAGNOSIS — R0602 Shortness of breath: Secondary | ICD-10-CM | POA: Diagnosis present

## 2019-02-21 DIAGNOSIS — R059 Cough, unspecified: Secondary | ICD-10-CM

## 2019-02-21 DIAGNOSIS — J441 Chronic obstructive pulmonary disease with (acute) exacerbation: Secondary | ICD-10-CM | POA: Insufficient documentation

## 2019-02-21 DIAGNOSIS — Z7982 Long term (current) use of aspirin: Secondary | ICD-10-CM | POA: Diagnosis not present

## 2019-02-21 DIAGNOSIS — Z1159 Encounter for screening for other viral diseases: Secondary | ICD-10-CM | POA: Diagnosis not present

## 2019-02-21 DIAGNOSIS — Z7984 Long term (current) use of oral hypoglycemic drugs: Secondary | ICD-10-CM | POA: Insufficient documentation

## 2019-02-21 DIAGNOSIS — J069 Acute upper respiratory infection, unspecified: Secondary | ICD-10-CM | POA: Diagnosis not present

## 2019-02-21 DIAGNOSIS — E1140 Type 2 diabetes mellitus with diabetic neuropathy, unspecified: Secondary | ICD-10-CM | POA: Diagnosis not present

## 2019-02-21 DIAGNOSIS — H6692 Otitis media, unspecified, left ear: Secondary | ICD-10-CM | POA: Diagnosis not present

## 2019-02-21 DIAGNOSIS — I10 Essential (primary) hypertension: Secondary | ICD-10-CM | POA: Insufficient documentation

## 2019-02-21 DIAGNOSIS — R0789 Other chest pain: Secondary | ICD-10-CM | POA: Diagnosis not present

## 2019-02-21 LAB — CBC WITH DIFFERENTIAL/PLATELET
Abs Immature Granulocytes: 0.04 10*3/uL (ref 0.00–0.07)
Basophils Absolute: 0.1 10*3/uL (ref 0.0–0.1)
Basophils Relative: 1 %
Eosinophils Absolute: 0.2 10*3/uL (ref 0.0–0.5)
Eosinophils Relative: 1 %
HCT: 46.9 % — ABNORMAL HIGH (ref 36.0–46.0)
Hemoglobin: 14.3 g/dL (ref 12.0–15.0)
Immature Granulocytes: 0 %
Lymphocytes Relative: 28 %
Lymphs Abs: 4.2 10*3/uL — ABNORMAL HIGH (ref 0.7–4.0)
MCH: 25.8 pg — ABNORMAL LOW (ref 26.0–34.0)
MCHC: 30.5 g/dL (ref 30.0–36.0)
MCV: 84.5 fL (ref 80.0–100.0)
Monocytes Absolute: 1 10*3/uL (ref 0.1–1.0)
Monocytes Relative: 7 %
Neutro Abs: 9.4 10*3/uL — ABNORMAL HIGH (ref 1.7–7.7)
Neutrophils Relative %: 63 %
Platelets: 274 10*3/uL (ref 150–400)
RBC: 5.55 MIL/uL — ABNORMAL HIGH (ref 3.87–5.11)
RDW: 16.6 % — ABNORMAL HIGH (ref 11.5–15.5)
WBC: 14.9 10*3/uL — ABNORMAL HIGH (ref 4.0–10.5)
nRBC: 0 % (ref 0.0–0.2)

## 2019-02-21 LAB — BASIC METABOLIC PANEL
Anion gap: 10 (ref 5–15)
BUN: 11 mg/dL (ref 6–20)
CO2: 27 mmol/L (ref 22–32)
Calcium: 9.4 mg/dL (ref 8.9–10.3)
Chloride: 103 mmol/L (ref 98–111)
Creatinine, Ser: 0.73 mg/dL (ref 0.44–1.00)
GFR calc Af Amer: >60 mL/min (ref >60)
GFR calc non Af Amer: >60 mL/min (ref >60)
Glucose, Bld: 116 mg/dL — ABNORMAL HIGH (ref 70–99)
Potassium: 3.5 mmol/L (ref 3.5–5.1)
Sodium: 140 mmol/L (ref 135–145)

## 2019-02-21 MED ORDER — ALBUTEROL SULFATE HFA 108 (90 BASE) MCG/ACT IN AERS
2.0000 | INHALATION_SPRAY | Freq: Once | RESPIRATORY_TRACT | Status: AC
  Administered 2019-02-21: 2 via RESPIRATORY_TRACT
  Filled 2019-02-21: qty 6.7

## 2019-02-21 MED ORDER — OXYCODONE-ACETAMINOPHEN 5-325 MG PO TABS
1.0000 | ORAL_TABLET | Freq: Once | ORAL | Status: AC
  Administered 2019-02-21: 1 via ORAL
  Filled 2019-02-21: qty 1

## 2019-02-21 MED ORDER — KETOROLAC TROMETHAMINE 30 MG/ML IJ SOLN
30.0000 mg | Freq: Once | INTRAMUSCULAR | Status: AC
  Administered 2019-02-22: 30 mg via INTRAVENOUS
  Filled 2019-02-21: qty 1

## 2019-02-21 MED ORDER — PROCHLORPERAZINE EDISYLATE 10 MG/2ML IJ SOLN
10.0000 mg | Freq: Once | INTRAMUSCULAR | Status: AC
  Administered 2019-02-22: 10 mg via INTRAVENOUS
  Filled 2019-02-21: qty 2

## 2019-02-21 MED ORDER — ALBUTEROL SULFATE (2.5 MG/3ML) 0.083% IN NEBU: 2.5000 mg | INHALATION_SOLUTION | Freq: Four times a day (QID) | RESPIRATORY_TRACT | 12 refills | Status: DC | PRN

## 2019-02-21 MED ORDER — SODIUM CHLORIDE 0.9 % IV BOLUS
500.0000 mL | Freq: Once | INTRAVENOUS | Status: AC
  Administered 2019-02-22: 500 mL via INTRAVENOUS

## 2019-02-21 MED ORDER — PREDNISONE 20 MG PO TABS
60.0000 mg | ORAL_TABLET | Freq: Once | ORAL | Status: AC
  Administered 2019-02-21: 60 mg via ORAL
  Filled 2019-02-21: qty 3

## 2019-02-21 MED ORDER — DIPHENHYDRAMINE HCL 50 MG/ML IJ SOLN
25.0000 mg | Freq: Once | INTRAMUSCULAR | Status: AC
  Administered 2019-02-22: 25 mg via INTRAVENOUS
  Filled 2019-02-21: qty 1

## 2019-02-21 MED ORDER — PREDNISONE 20 MG PO TABS: ORAL_TABLET | ORAL | 0 refills | Status: DC

## 2019-02-21 MED ORDER — DOXYCYCLINE HYCLATE 100 MG PO CAPS: 100.0000 mg | ORAL_CAPSULE | Freq: Two times a day (BID) | ORAL | 0 refills | Status: DC

## 2019-02-21 NOTE — ED Provider Notes (Addendum)
[facility] DEPT Provider Note   CSN: [id number] Arrival date & time: 02/21/19  1910     History   Chief Complaint Chief Complaint  Patient presents with  . Shortness of Breath    HPI [name] is a 53 y.o. female.     Patient is a 53 year old female with a history of COPD who presents with shortness of breath.  She states over the last 3 days she has had some URI symptoms with runny nose congestion sore throat.  She has a bifrontal type headache.  She has had coughing which is mostly nonproductive and some soreness across her chest with the coughing.  She has some chronic leg swelling which she says is unchanged from baseline.  She has had some chills but no known fevers.  No vomiting or diarrhea.  She previously has been on home oxygen but she no longer uses it.  She says that she monitors her oxygen saturation at home.  She says been doing okay in the 90s although today at rest it seemed to drop down into the lower 90s or upper 80s.  She was seen in urgent care and sent here for further evaluation.  She has been using Mucinex and some over-the-counter medicines with no improvement in symptoms.  She also uses an albuterol inhaler with no improvement in symptoms.     Past Medical History:  Diagnosis Date  . Anemia   . Anxiety   . Asthmatic bronchitis    normal PFT/ seen by pulmonary no evidence of COPD  . Atypical chest pain 10/05/2017  . Chest pain on respiration 03/25/2014  . Chronic bronchitis ([place])   . Chronic lower back pain   . Chronic respiratory failure ([place]) 10/16/2011   Newly 02 dep 24/7 p discharge from [facility] 01/2013  - 03/13/2014  Walked RA  2 laps @ 185 ft each stopped due to  Sob/ aching in legs, thirsty/ no desat @ slow pace   . Chronic respiratory failure with hypoxia (HCC)    On 2-3 L of oxygen at home  . Cigarette smoker 12/02/2010   Followed in Pulmonary clinic/ [facility]/ [place]   - Limits of effective care  reviewed 12/22/2011    . COPD (chronic obstructive pulmonary disease) ([place])   . COPD with chronic bronchitis ([place]) 10/01/2017  . Daily headache   . Depression   . Diabetic peripheral neuropathy ([place])   . DM (diabetes mellitus) type II controlled, neurological manifestation ([place]) 12/02/2010  . Gastric erosions    EGD 08/2010.  [name] GERD (gastroesophageal reflux disease)   . H/O drug abuse ([place]) 11/12/2017   -- scanned document from outside source: [facility] in [place] which showed UDS positive for Adderall /amphetamine usage as well as positive urine for THC.  This test result was collected 08/11/2016 and reported 10/07/2016. - lso review of the chart shows another positive amphetamine, THC and METH in the urine back on 10/18/2015 under "care everywhere". ---So   . Heavy menses   . High cholesterol   . History of blood transfusion    "related to low HgB" (10/05/2017)  . History of hiatal hernia   . HTN (hypertension)   . Hyperlipidemia associated with type 2 diabetes mellitus ([place]) 10/18/2015  . Hypertension associated with diabetes ([place]) 12/02/2010   D/c acei 12/22/2011 due to psuedowheeze and narcotic dependent cough> ? Improved - [id number] started bystolic in place of cozar due to cough    .  Increased urinary protein excretion   . Internal hemorrhoids    Colonoscopy 5/12.  . Iron deficiency anemia 10/01/2017  . Mixed diabetic hyperlipidemia associated with type 2 diabetes mellitus ([place]) 10/01/2017  . On home oxygen therapy    "5L at night" (10/05/2017)  . OSA on CPAP   . Osteoarthritis    "back" (10/05/2017)  . Oxygen dependent 10/16/2011  . Pneumonia    "lots of times" (10/05/2017)  . Poorly controlled diabetes mellitus ([place]) 11/12/2017  . Pulmonary infiltrates 12/22/2011   Followed in Pulmonary clinic/ [facility]/ [place]    - See CT Chest 05/05/11   . Shortness of breath   . Tachycardia    never had test done since no insurance  . Tobacco use  disorder-current smoker greater than 40-pack-year history- since age 65 1 ppd 10/01/2017  . Type II diabetes mellitus [facility])     Patient Active Problem List   Diagnosis Date Noted  . Generalized muscle ache 09/24/2018  . Personal history of noncompliance with medical treatment, presenting hazards to health 09/24/2018  . SOB (shortness of breath) 07/09/2018  . Fever, low grade 07/09/2018  . Cough in adult 07/09/2018  . Diabetes mellitus ([place]) 03/11/2018  . Chronic right ear pain 03/11/2018  . Chronic sinusitis 03/11/2018  . H/O drug abuse ([place]) 11/12/2017  . Positive for macroalbuminuria 11/12/2017  . h/o freq Vaginal yeast infections 11/12/2017  . Poorly controlled diabetes mellitus ([place]) 11/12/2017  . Atypical chest pain 10/05/2017  . COPD with chronic bronchitis ([place]) 10/01/2017  . Mixed diabetic hyperlipidemia associated with type 2 diabetes mellitus ([place]) 10/01/2017  . Tobacco use disorder-current smoker greater than 40-pack-year history- since age 82 1 ppd 10/01/2017  . Tobacco abuse counseling 10/01/2017  . Diabetic peripheral neuropathy ([place]) 10/01/2017  . Iron deficiency anemia 10/01/2017  . Adult attention deficit hyperactivity disorder 01/14/2016  . Gastric reflux 10/18/2015  . Dyspepsia 09/23/2014  . Major depressive disorder, recurrent, severe without psychotic features ([place])   . MDD (major depressive disorder), recurrent episode, severe ([place]) 09/13/2014  . Nasal congestion 05/03/2014  . Chest pain on respiration 03/25/2014  . Leukocytosis 03/25/2014  . Upper airway cough syndrome 02/28/2014  . Abnormal drug screen 04/11/2013  . OSA on CPAP 04/11/2013  . Perforated ear drum 05/22/2012  . Bell's palsy 01/23/2012  . Carpal tunnel syndrome 01/23/2012  . Carpal tunnel syndrome on both sides 01/11/2012  . Pulmonary infiltrates 12/22/2011  . Chronic respiratory failure ([place]) 10/16/2011  . Oxygen dependent 10/16/2011  . Peripheral neuropathy 07/07/2011  . Vitamin D  deficiency 07/06/2011  . Asthmatic bronchitis 06/11/2011  . Back pain 06/11/2011  . Morbid obesity ([place]) 05/06/2011  . Depression 05/04/2011  . Gastric erosions 12/05/2010  . Hypertension associated with diabetes ([place]) 12/02/2010  . Diabetes mellitus, type II ([place]) 12/02/2010  . Cigarette smoker 12/02/2010  . COPD with acute exacerbation ([place]) 11/30/2010  . Microcytic anemia 09/08/2010  . GERD (gastroesophageal reflux disease) 09/08/2010  . Esophageal dysphagia 09/08/2010    Past Surgical History:  Procedure Laterality Date  . CESAREAN SECTION  1988; 1989  . CHOLECYSTECTOMY OPEN  1990  . COLONOSCOPY  09/16/2010   [id number] colon/small internal hemorrhoids  . ESOPHAGOGASTRODUODENOSCOPY  09/16/2010   SLF: normal/mild gastritis  . ESOPHAGOGASTRODUODENOSCOPY N/A 10/19/2014   Procedure: ESOPHAGOGASTRODUODENOSCOPY (EGD);  Surgeon: Danie Binder, MD;  Location: [facility] ENDO SUITE;  Service: Endoscopy;  Laterality: N/A;  830  . FRACTURE SURGERY    . HYSTEROSCOPY WITH THERMACHOICE  01/17/2012   Procedure:  HYSTEROSCOPY WITH THERMACHOICE;  Surgeon: Florian Buff, MD;  Location: [facility] ORS;  Service: Gynecology;  Laterality: N/A;  total therapy time: 9:13sec  D5W  18 ml in, D5W   22ml out, temperture 87degrees celcious  . KIDNEY SURGERY     as child for blockages  . TONSILLECTOMY    . TUBAL LIGATION  1989  . TYMPANOSTOMY TUBE PLACEMENT Bilateral    "several times when I was a child"  . uterine ablation    . WRIST FRACTURE SURGERY Left 1995     OB History    Gravida  2   Para  2   Term  2   Preterm      AB      Living  2     SAB      TAB      Ectopic      Multiple      Live Births               Home Medications    Prior to Admission medications   Medication Sig Start Date End Date Taking? Authorizing Provider  acetaminophen (TYLENOL) 500 MG tablet Take 1,000 mg by mouth every 6 (six) hours as needed for mild pain or headache.   Yes [provider]  ALPRAZolam  Duanne Moron) 0.5 MG tablet Take 1 tablet (0.5 mg total) by mouth daily as needed for anxiety. 01/13/19 01/13/20 Yes Cloria Spring, MD  amphetamine-dextroamphetamine (ADDERALL) 30 MG tablet Take 1 tablet by mouth 2 (two) times daily. 01/13/19 01/13/20 Yes Cloria Spring, MD  aspirin EC 81 MG tablet Take 1 tablet (81 mg total) by mouth daily. 12/07/17  Yes Dunn, Dayna N, PA-C  atorvastatin (LIPITOR) 80 MG tablet Take 1 tablet (80 mg total) by mouth daily. 12/07/17 03/12/19 Yes Dunn, Nedra Hai, PA-C  canagliflozin (INVOKANA) 300 MG TABS tablet Take 1 tablet (300 mg total) by mouth daily before breakfast. 12/11/18  Yes Cirigliano, Mary K, DO  DULoxetine (CYMBALTA) 60 MG capsule TAKE 1 CAPSULE(60 MG) BY MOUTH AT BEDTIME 01/13/19  Yes Cloria Spring, MD  fluticasone [facility]) 50 MCG/ACT nasal spray USE 1 SPRAY IN EACH NOSTRIL TWICE DAILY AFTER SINUS RINSES 12/11/18  Yes Cirigliano, Mary K, DO  Fluticasone-Salmeterol (ADVAIR DISKUS) 250-50 MCG/DOSE AEPB Inhale 1 puff into the lungs 2 (two) times daily. Flush mouth after each use- NO RF's NEEDS OV 11/06/18  Yes Opalski, Neoma Laming, DO  isosorbide mononitrate (IMDUR) 30 MG 24 hr tablet TAKE 1 TABLET BY MOUTH EVERY DAY 02/07/19  Yes Dunn, Dayna N, PA-C  liraglutide (VICTOZA) 18 MG/3ML SOPN INJECT 0.3 MLS (1.8 MG TOTAL) INTO THE SKIN DAILY. 12/11/18  Yes Cirigliano, Mary K, DO  losartan (COZAAR) 25 MG tablet Take 1 tablet (25 mg total) by mouth daily. 12/11/18  Yes Cirigliano, Mary K, DO  pantoprazole (PROTONIX) 40 MG tablet Take 1 tablet (40 mg total) by mouth daily. 30 minutes before a meal. 11/21/18 11/16/19 Yes Cirigliano, Mary K, DO  pregabalin (LYRICA) 150 MG capsule Take 1 capsule (150 mg total) by mouth 2 (two) times daily. No ReFills 11/21/18  Yes Cirigliano, Mary K, DO  albuterol (PROVENTIL) (2.5 MG/3ML) 0.083% nebulizer solution Take 3 mLs (2.5 mg total) by nebulization every 6 (six) hours as needed for wheezing or shortness of breath. 02/21/19   Malvin Johns, MD  BD PEN  NEEDLE NANO U/F 32G X 4 MM MISC USE AS DIRECTED WITH VICTOZA 09/10/18   Mellody Dance, DO  dextromethorphan-guaiFENesin [facility] DM)  30-600 MG 12hr tablet Take 1 tablet by mouth 2 (two) times daily.    [provider]  dicyclomine (BENTYL) 10 MG capsule Take 1 capsule (10 mg total) by mouth every 8 (eight) hours as needed for spasms. Patient not taking: Reported on 02/21/2019 12/23/18   Armbruster, Carlota Raspberry, MD  doxycycline (VIBRAMYCIN) 100 MG capsule Take 1 capsule (100 mg total) by mouth 2 (two) times daily. One po bid x 7 days 02/21/19   Malvin Johns, MD  ibuprofen (ADVIL) 800 MG tablet Take 1 tablet (800 mg total) by mouth every 8 (eight) hours as needed. 12/03/18   [name], MD  meclizine (ANTIVERT) 25 MG tablet Take 1 tablet (25 mg total) by mouth 3 (three) times daily as needed for dizziness or nausea. Patient not taking: Reported on 02/21/2019 12/07/17   Mellody Dance, DO  polyethylene glycol (MIRALAX) 17 g packet Take 17 g by mouth daily. 12/23/18   Armbruster, Carlota Raspberry, MD  predniSONE (DELTASONE) 20 MG tablet 2 tabs po daily x 4 days 02/21/19   Malvin Johns, MD  Pseudoeph-Doxylamine-DM-APAP (DAYQUIL/NYQUIL COLD/FLU RELIEF PO) Take by mouth.    [provider]  Vitamin D, Ergocalciferol, (DRISDOL) 50000 units CAPS capsule Take 1 capsule (50,000 Units total) by mouth every 7 (seven) days. Patient not taking: Reported on 02/21/2019 11/12/17   Mellody Dance, DO    Family History Family History  Problem Relation Age of Onset  . Heart attack Father 6       deceased, etoh use  . Heart disease Father   . Alcohol abuse Father   . Depression Father   . Heart failure Father   . Heart attack Mother 16       deceased  . Diabetes Mother   . Breast cancer Mother   . Heart failure Mother        oxygen dependence, nonsmoker  . Heart disease Mother   . Depression Mother   . Cancer Mother   . Liver disease Maternal Aunt 76       died while on liver  transplant list  . Heart attack Maternal Grandmother        premature CAD  . Ulcers Sister   . Hypertension Sister   . Heart failure Sister   . Colon cancer Neg Hx     Social History Social History   Tobacco Use  . Smoking status: Current Every Day Smoker    Packs/day: 1.00    Years: 39.00    Pack years: 39.00    Types: Cigarettes  . Smokeless tobacco: Never Used  Substance Use Topics  . Alcohol use: Yes    Comment: 10/05/2017 "5 - 8 shots tequilia//month"  . Drug use: Yes    Types: Marijuana    Comment: 10/05/2017 "a few times/wk"     Allergies   Codeine, Metformin and related, Wellbutrin [bupropion hcl], and Acyclovir and related   Review of Systems Review of Systems  Constitutional: Positive for fatigue. Negative for chills, diaphoresis and fever.  HENT: Positive for congestion, postnasal drip, rhinorrhea and sore throat. Negative for sneezing.   Eyes: Negative.   Respiratory: Positive for cough and shortness of breath. Negative for chest tightness.   Cardiovascular: Positive for chest pain (With coughing) and leg swelling (At baseline).  Gastrointestinal: Negative for abdominal pain, blood in stool, diarrhea, nausea and vomiting.  Genitourinary: Negative for difficulty urinating, flank pain, frequency and hematuria.  Musculoskeletal: Negative for arthralgias and back pain.  Skin: Negative for rash.  Neurological: Positive for headaches. Negative for dizziness, speech difficulty, weakness and numbness.     Physical Exam Updated Vital Signs BP 140/76   Pulse 81   Temp 98.4 F (36.9 C) (Oral)   Resp 19   Ht 5' 4.5" (1.638 m)   Wt 119.7 kg   SpO2 94%   BMI 44.62 kg/m   Physical Exam Constitutional:      Appearance: She is well-developed.  HENT:     Head: Normocephalic and atraumatic.  Eyes:     Pupils: Pupils are equal, round, and reactive to light.  Neck:     Musculoskeletal: Normal range of motion and neck supple.  Cardiovascular:     Rate and  Rhythm: Normal rate and regular rhythm.     Heart sounds: Normal heart sounds.  Pulmonary:     Effort: Pulmonary effort is normal. Tachypnea present. No respiratory distress.     Breath sounds: Normal breath sounds.     Comments: Mild tachypnea with no accessory muscle use, no audible wheezing Chest:     Chest wall: No tenderness.  Abdominal:     General: Bowel sounds are normal.     Palpations: Abdomen is soft.     Tenderness: There is no abdominal tenderness. There is no guarding or rebound.  Musculoskeletal: Normal range of motion.     Right lower leg: Edema present.     Left lower leg: Edema present.     Comments: 1+ pitting edema bilaterally  Lymphadenopathy:     Cervical: No cervical adenopathy.  Skin:    General: Skin is warm and dry.     Findings: No rash.  Neurological:     Mental Status: She is alert and oriented to person, place, and time.      ED Treatments / Results  Labs (all labs ordered are listed, but only abnormal results are displayed) Labs Reviewed  CBC WITH DIFFERENTIAL/PLATELET - Abnormal; Notable for the following components:      Result Value   WBC 14.9 (*)    RBC 5.55 (*)    HCT 46.9 (*)    MCH 25.8 (*)    RDW 16.6 (*)    Neutro Abs 9.4 (*)    Lymphs Abs 4.2 (*)    All other components within normal limits  SARS CORONAVIRUS 2 (TAT 6-24 HRS)  BASIC METABOLIC PANEL    EKG EKG Interpretation  Date/Time:  Friday February 21 2019 19:45:08 EDT Ventricular Rate:  89 PR Interval:    QRS Duration: 85 QT Interval:  367 QTC Calculation: 447 R Axis:   -4 Text Interpretation: Sinus rhythm Low voltage, extremity leads Baseline wander in lead(s) II III aVF since last tracing no significant change Confirmed by Malvin Johns [id number]) on 02/21/2019 10:01:39 PM   Radiology Dg Chest Port 1 View  Result Date: 02/21/2019 CLINICAL DATA:  current symptoms has been 3 days, with rhinorrhea, nasal congestion, sore throat, cough. Has had chills and body  aches. Current smoker - diabetic - COPD - chronic bronchitis EXAM: PORTABLE CHEST 1 VIEW COMPARISON:  01/10/2018.  09/21/2018. FINDINGS: Cardiac silhouette is normal in size. No mediastinal or hilar masses. No evidence of adenopathy. Lungs are hyperexpanded. Lungs are clear. No pleural effusion or pneumothorax. Skeletal structures are intact. IMPRESSION: No active disease. Electronically Signed   By: Lajean Manes M.D.   On: 02/21/2019 20:48    Procedures Procedures (including critical care time)  Medications Ordered in ED Medications  albuterol (VENTOLIN HFA) 108 (90 Base) MCG/ACT  inhaler 2 puff (has no administration in time range)  predniSONE (DELTASONE) tablet 60 mg (has no administration in time range)  oxyCODONE-acetaminophen (PERCOCET/ROXICET) 5-325 MG per tablet 1 tablet (1 tablet Oral Given 02/21/19 2043)     Initial Impression / Assessment and Plan / ED Course  I have reviewed the triage vital signs and the nursing notes.  Pertinent labs & imaging results that were available during my care of the patient were reviewed by me and considered in my medical decision making (see chart for details).        Patient presents with URI symptoms and some increased shortness of breath.  Her chest x-ray is clear without evidence of pneumonia.  She has no increased work of breathing.  She has been monitored here in the emergency room and so far she is only had 1 episode where her oxygen saturation dropped down below 90 for short period time it went right back up into the mid 90s.  Awaiting her labs.  If she is able to ambulate in the room with no significant hypoxia she may be able to go home with prescriptions for prednisone, doxycycline and I also give her prescription for her nebulizer solution.  She does have oxygen at home to use if she needs it.  She has a pulse oximeter at home.  [facility] turned [facility] to [facility] pending these results.  Final Clinical Impressions(s) / ED Diagnoses   Final  diagnoses:  COPD exacerbation ([place])  Upper respiratory tract infection, unspecified type    ED Discharge Orders         Ordered    predniSONE (DELTASONE) 20 MG tablet     02/21/19 2154    doxycycline (VIBRAMYCIN) 100 MG capsule  2 times daily     02/21/19 2154    albuterol (PROVENTIL) (2.5 MG/3ML) 0.083% nebulizer solution  Every 6 hours PRN     02/21/19 2157           Malvin Johns, MD 02/21/19 2200    Malvin Johns, MD 02/21/19 2202

## 2019-02-21 NOTE — Discharge Instructions (Addendum)
Use your albuterol 2 puffs every 4-6 hours for the next 3 to 4 days.  Take the prednisone as directed.  Follow-up with your primary care provider if your symptoms are not improving by Monday.  Return here as needed for any worsening symptoms.  Take 1 tablet of doxycycline 2 times daily for the next 7 days.  This should also help to treat your ear infection.  Your COVID-19 test is pending.  Make sure that you are quarantining yourself at home until your test results.  Make sure that you are washing your hands with warm water and soap frequently.  If you have to cough, cough in your sleep and cover your mouth, then wash your hands.  I make sure that you are wearing a mask if you are unable to maintain social distancing from others in your family or if you have to leave your house for an emergency until your test results.  If the test is positive, you should receive a call from someone at the hospital with the number you gave registration today.  You will not receive a call if the test is negative, but the results will be in your MyChart account.   Return to the emergency department if you develop respiratory distress, chest pain, if you pass out, have high fevers despite taking Tylenol, or if you develop other new, concerning symptoms.

## 2019-02-21 NOTE — Patient Instructions (Signed)
Health Maintenance Due  Topic Date Due  . OPHTHALMOLOGY EXAM  03/04/1976  . MAMMOGRAM  03/04/2016  . COLONOSCOPY  03/04/2016  . INFLUENZA VACCINE  11/23/2018  . FOOT EXAM  01/11/2019    Depression screen [facility] 2/9 09/24/2018 07/09/2018 03/11/2018  Decreased Interest 1 1 0  Down, Depressed, Hopeless 1 2 0  PHQ - 2 Score 2 3 0  Altered sleeping 2 3 1   Tired, decreased energy 2 3 1   Change in appetite 2 1 0  Feeling bad or failure about yourself  0 1 0  Trouble concentrating 1 1 1   Moving slowly or fidgety/restless 0 0 0  Suicidal thoughts 0 0 0  PHQ-9 Score 9 12 3   Difficult doing work/chores Somewhat difficult Very difficult Somewhat difficult  Some recent data might be hidden

## 2019-02-21 NOTE — ED Notes (Signed)
Pt able to ambulate once in room with minimal assist. Complaining of being short of breath. MD Belfi at bedside.

## 2019-02-21 NOTE — ED Triage Notes (Addendum)
Pt. States she has had a sore throat, fatigue, chest pain when coughing. Runny nose,sneezing, body aches/chills since Wed. She had an E-Visit today was informed to come here.

## 2019-02-21 NOTE — ED Provider Notes (Addendum)
[facility]    CSN: [id number] Arrival date & time: 02/21/19  1720      History   Chief Complaint No chief complaint on file.   HPI [name] is a 53 y.o. female.   53 year old female with significant pulmonary history, diabetes, comes in for 3-day history of URI symptoms.  She did ED visit with PCP, and was told to come to urgent care for evaluation.  Patient states has history of COPD, is supposed to be on home oxygen.  She self discontinued home oxygen after getting a pulse ox and monitored her oxygen level on own.  States usually during URI, her oxygen level drops to 87 to 88%, and she would put herself back on 3 L oxygen. Normal O2 sat at home about 94% on room air.  She is also on CPAP at night for sleep apnea.  States her own machine broke, and is currently using her friend's CPAP.  States 2 years ago, she was admitted for pneumonia with respiratory failure, and was told her O2 sats dropped despite using CPAP at night.   Her current symptoms has been 3 days, with rhinorrhea, nasal congestion, sore throat, cough.  Has had chills and body aches.  She has been alternating Tylenol and Motrin, and is unsure if she has had a fever. Feels fatigued. She has chest pain during cough.  And feels short of breath, worse with exertion.  She has been using her albuterol without much relief.  Denies hemoptysis.  Current everyday smoker. Not  Currently on advair as she ran out >1 month ago. Last a1c 7.7. No COVID contact.        Past Medical History:  Diagnosis Date  . Anemia   . Anxiety   . Asthmatic bronchitis    normal PFT/ seen by pulmonary no evidence of COPD  . Atypical chest pain 10/05/2017  . Chest pain on respiration 03/25/2014  . Chronic bronchitis ([facility])   . Chronic lower back pain   . Chronic respiratory failure ([place]) 10/16/2011   Newly 02 dep 24/7 p discharge from [facility] 01/2013  - 03/13/2014  Walked RA  2 laps @ 185 ft each stopped due to  Sob/  aching in legs, thirsty/ no desat @ slow pace   . Chronic respiratory failure with hypoxia (HCC)    On 2-3 L of oxygen at home  . Cigarette smoker 12/02/2010   Followed in Pulmonary clinic/  Healthcare/ [place]   - Limits of effective care reviewed 12/22/2011    . COPD (chronic obstructive pulmonary disease) ([place])   . COPD with chronic bronchitis ([place]) 10/01/2017  . Daily headache   . Depression   . Diabetic peripheral neuropathy ([place])   . DM (diabetes mellitus) type II controlled, neurological manifestation ([place]) 12/02/2010  . Gastric erosions    EGD 08/2010.  [name] GERD (gastroesophageal reflux disease)   . H/O drug abuse ([place]) 11/12/2017   -- scanned document from outside source: [facility] in [place] which showed UDS positive for Adderall /amphetamine usage as well as positive urine for THC.  This test result was collected 08/11/2016 and reported 10/07/2016. - lso review of the chart shows another positive amphetamine, THC and METH in the urine back on 10/18/2015 under "care everywhere". ---So   . Heavy menses   . High cholesterol   . History of blood transfusion    "related to low HgB" (10/05/2017)  . History of hiatal hernia   .  HTN (hypertension)   . Hyperlipidemia associated with type 2 diabetes mellitus ([place]) 10/18/2015  . Hypertension associated with diabetes ([place]) 12/02/2010   D/c acei 12/22/2011 due to psuedowheeze and narcotic dependent cough> ? Improved - [id number] started bystolic in place of cozar due to cough    . Increased urinary protein excretion   . Internal hemorrhoids    Colonoscopy 5/12.  . Iron deficiency anemia 10/01/2017  . Mixed diabetic hyperlipidemia associated with type 2 diabetes mellitus ([place]) 10/01/2017  . On home oxygen therapy    "5L at night" (10/05/2017)  . OSA on CPAP   . Osteoarthritis    "back" (10/05/2017)  . Oxygen dependent 10/16/2011  . Pneumonia    "lots of times" (10/05/2017)  . Poorly controlled diabetes  mellitus ([place]) 11/12/2017  . Pulmonary infiltrates 12/22/2011   Followed in Pulmonary clinic/ [facility]/ [place]    - See CT Chest 05/05/11   . Shortness of breath   . Tachycardia    never had test done since no insurance  . Tobacco use disorder-current smoker greater than 40-pack-year history- since age 68 1 ppd 10/01/2017  . Type II diabetes mellitus [facility])     Patient Active Problem List   Diagnosis Date Noted  . Generalized muscle ache 09/24/2018  . Personal history of noncompliance with medical treatment, presenting hazards to health 09/24/2018  . SOB (shortness of breath) 07/09/2018  . Fever, low grade 07/09/2018  . Cough in adult 07/09/2018  . Diabetes mellitus ([place]) 03/11/2018  . Chronic right ear pain 03/11/2018  . Chronic sinusitis 03/11/2018  . H/O drug abuse ([place]) 11/12/2017  . Positive for macroalbuminuria 11/12/2017  . h/o freq Vaginal yeast infections 11/12/2017  . Poorly controlled diabetes mellitus ([place]) 11/12/2017  . Atypical chest pain 10/05/2017  . COPD with chronic bronchitis ([place]) 10/01/2017  . Mixed diabetic hyperlipidemia associated with type 2 diabetes mellitus ([place]) 10/01/2017  . Tobacco use disorder-current smoker greater than 40-pack-year history- since age 75 1 ppd 10/01/2017  . Tobacco abuse counseling 10/01/2017  . Diabetic peripheral neuropathy ([place]) 10/01/2017  . Iron deficiency anemia 10/01/2017  . Adult attention deficit hyperactivity disorder 01/14/2016  . Gastric reflux 10/18/2015  . Dyspepsia 09/23/2014  . Major depressive disorder, recurrent, severe without psychotic features ([place])   . MDD (major depressive disorder), recurrent episode, severe ([place]) 09/13/2014  . Nasal congestion 05/03/2014  . Chest pain on respiration 03/25/2014  . Leukocytosis 03/25/2014  . Upper airway cough syndrome 02/28/2014  . Abnormal drug screen 04/11/2013  . OSA on CPAP 04/11/2013  . Perforated ear drum 05/22/2012  . Bell's palsy 01/23/2012  . Carpal tunnel  syndrome 01/23/2012  . Carpal tunnel syndrome on both sides 01/11/2012  . Pulmonary infiltrates 12/22/2011  . Chronic respiratory failure ([place]) 10/16/2011  . Oxygen dependent 10/16/2011  . Peripheral neuropathy 07/07/2011  . Vitamin D deficiency 07/06/2011  . Asthmatic bronchitis 06/11/2011  . Back pain 06/11/2011  . Morbid obesity ([place]) 05/06/2011  . Depression 05/04/2011  . Gastric erosions 12/05/2010  . Hypertension associated with diabetes ([place]) 12/02/2010  . Diabetes mellitus, type II ([place]) 12/02/2010  . Cigarette smoker 12/02/2010  . COPD with acute exacerbation ([place]) 11/30/2010  . Microcytic anemia 09/08/2010  . GERD (gastroesophageal reflux disease) 09/08/2010  . Esophageal dysphagia 09/08/2010    Past Surgical History:  Procedure Laterality Date  . CESAREAN SECTION  1988; 1989  . CHOLECYSTECTOMY OPEN  1990  . COLONOSCOPY  09/16/2010   [id number] colon/small internal hemorrhoids  . ESOPHAGOGASTRODUODENOSCOPY  09/16/2010   SLF: normal/mild gastritis  . ESOPHAGOGASTRODUODENOSCOPY N/A 10/19/2014   Procedure: ESOPHAGOGASTRODUODENOSCOPY (EGD);  Surgeon: Danie Binder, MD;  Location: [facility];  Service: Endoscopy;  Laterality: N/A;  830  . FRACTURE SURGERY    . HYSTEROSCOPY WITH THERMACHOICE  01/17/2012   Procedure: HYSTEROSCOPY WITH THERMACHOICE;  Surgeon: Florian Buff, MD;  Location: [facility] ORS;  Service: Gynecology;  Laterality: N/A;  total therapy time: 9:13sec  D5W  18 ml in, D5W   47ml out, temperture 87degrees celcious  . KIDNEY SURGERY     as child for blockages  . TONSILLECTOMY    . TUBAL LIGATION  1989  . TYMPANOSTOMY TUBE PLACEMENT Bilateral    "several times when I was a child"  . uterine ablation    . WRIST FRACTURE SURGERY Left 1995    OB History    Gravida  2   Para  2   Term  2   Preterm      AB      Living  2     SAB      TAB      Ectopic      Multiple      Live Births               Home Medications    Prior to Admission  medications   Medication Sig Start Date End Date Taking? Authorizing Provider  acetaminophen (TYLENOL) 500 MG tablet Take 1,000 mg by mouth every 6 (six) hours as needed for mild pain or headache.    [provider]  ALPRAZolam Duanne Moron) 0.5 MG tablet Take 1 tablet (0.5 mg total) by mouth daily as needed for anxiety. 01/13/19 01/13/20  Cloria Spring, MD  amphetamine-dextroamphetamine (ADDERALL) 30 MG tablet Take 1 tablet by mouth 2 (two) times daily. 01/13/19 01/13/20  Cloria Spring, MD  aspirin EC 81 MG tablet Take 1 tablet (81 mg total) by mouth daily. 12/07/17   Dunn, Nedra Hai, PA-C  atorvastatin (LIPITOR) 80 MG tablet Take 1 tablet (80 mg total) by mouth daily. 12/07/17 03/12/19  Dunn, Nedra Hai, PA-C  BD PEN NEEDLE NANO U/F 32G X 4 MM MISC USE AS DIRECTED WITH VICTOZA 09/10/18   Opalski, Neoma Laming, DO  canagliflozin (INVOKANA) 300 MG TABS tablet Take 1 tablet (300 mg total) by mouth daily before breakfast. 12/11/18   Cirigliano, Garvin Fila, DO  dextromethorphan-guaiFENesin (MUCINEX DM) 30-600 MG 12hr tablet Take 1 tablet by mouth 2 (two) times daily.    [provider]  dicyclomine (BENTYL) 10 MG capsule Take 1 capsule (10 mg total) by mouth every 8 (eight) hours as needed for spasms. 12/23/18   Armbruster, Carlota Raspberry, MD  DULoxetine (CYMBALTA) 60 MG capsule TAKE 1 CAPSULE(60 MG) BY MOUTH AT BEDTIME 01/13/19   Cloria Spring, MD  fluticasone [facility]) 50 MCG/ACT nasal spray USE 1 SPRAY IN EACH NOSTRIL TWICE DAILY AFTER SINUS RINSES 12/11/18   Cirigliano, Mary K, DO  Fluticasone-Salmeterol (ADVAIR DISKUS) 250-50 MCG/DOSE AEPB Inhale 1 puff into the lungs 2 (two) times daily. Flush mouth after each use- NO RF's NEEDS OV 11/06/18   Opalski, Deborah, DO  ibuprofen (ADVIL) 800 MG tablet Take 1 tablet (800 mg total) by mouth every 8 (eight) hours as needed. 12/03/18   [name], MD  isosorbide mononitrate (IMDUR) 30 MG 24 hr tablet TAKE 1 TABLET BY MOUTH EVERY DAY 02/07/19   Dunn, Dayna N, PA-C   liraglutide (VICTOZA) 18 MG/3ML SOPN INJECT 0.3 MLS (  1.8 MG TOTAL) INTO THE SKIN DAILY. 12/11/18   Cirigliano, Garvin Fila, DO  losartan (COZAAR) 25 MG tablet Take 1 tablet (25 mg total) by mouth daily. 12/11/18   Cirigliano, Garvin Fila, DO  meclizine (ANTIVERT) 25 MG tablet Take 1 tablet (25 mg total) by mouth 3 (three) times daily as needed for dizziness or nausea. 12/07/17   Opalski, Neoma Laming, DO  pantoprazole (PROTONIX) 40 MG tablet Take 1 tablet (40 mg total) by mouth daily. 30 minutes before a meal. 11/21/18 11/16/19  Cirigliano, Garvin Fila, DO  polyethylene glycol (MIRALAX) 17 g packet Take 17 g by mouth daily. 12/23/18   Armbruster, Carlota Raspberry, MD  pregabalin (LYRICA) 150 MG capsule Take 1 capsule (150 mg total) by mouth 2 (two) times daily. No ReFills 11/21/18   Cirigliano, Mary K, DO  Pseudoeph-Doxylamine-DM-APAP (DAYQUIL/NYQUIL COLD/FLU RELIEF PO) Take by mouth.    [provider]  Vitamin D, Ergocalciferol, (DRISDOL) 50000 units CAPS capsule Take 1 capsule (50,000 Units total) by mouth every 7 (seven) days. Patient not taking: Reported on 02/21/2019 11/12/17   Mellody Dance, DO    Family History Family History  Problem Relation Age of Onset  . Heart attack Father 87       deceased, etoh use  . Heart disease Father   . Alcohol abuse Father   . Depression Father   . Heart failure Father   . Heart attack Mother 4       deceased  . Diabetes Mother   . Breast cancer Mother   . Heart failure Mother        oxygen dependence, nonsmoker  . Heart disease Mother   . Depression Mother   . Cancer Mother   . Liver disease Maternal Aunt 33       died while on liver transplant list  . Heart attack Maternal Grandmother        premature CAD  . Ulcers Sister   . Hypertension Sister   . Heart failure Sister   . Colon cancer Neg Hx     Social History Social History   Tobacco Use  . Smoking status: Current Every Day Smoker    Packs/day: 1.00    Years: 39.00    Pack years: 39.00    Types:  Cigarettes  . Smokeless tobacco: Never Used  Substance Use Topics  . Alcohol use: Yes    Comment: 10/05/2017 "5 - 8 shots tequilia//month"  . Drug use: Yes    Types: Marijuana    Comment: 10/05/2017 "a few times/wk"     Allergies   Codeine, Metformin and related, Wellbutrin [bupropion hcl], and Acyclovir and related   Review of Systems Review of Systems  Reason unable to perform ROS: See HPI as above.     Physical Exam Triage Vital Signs ED Triage Vitals  Enc Vitals Group     BP 02/21/19 1734 (!) 144/82     Pulse Rate 02/21/19 1734 92     Resp 02/21/19 1734 (!) 21     Temp 02/21/19 1734 98.3 F (36.8 C)     Temp Source 02/21/19 1734 Oral     SpO2 02/21/19 1734 91 %     Weight 02/21/19 1731 264 lb (119.7 kg)     Height --      Head Circumference --      Peak Flow --      Pain Score 02/21/19 1731 9     Pain Loc --      Pain Edu? --  Excl. in GC? --    No data found.  Updated Vital Signs BP (!) 144/82 (BP Location: Left Arm)   Pulse 92   Temp 98.3 F (36.8 C) (Oral)   Resp (!) 21   Wt 264 lb (119.7 kg)   SpO2 94%   BMI 45.32 kg/m   Physical Exam Constitutional:      General: She is in acute distress.     Appearance: She is not ill-appearing, toxic-appearing or diaphoretic.  HENT:     Head: Normocephalic and atraumatic.     Right Ear: Ear canal and external ear normal.     Left Ear: Ear canal and external ear normal. Tympanic membrane is erythematous. Tympanic membrane is not bulging.     Ears:     Comments: Right ear with perforated TM, at baseline. Erythematous. No obvious drainage seen.     Mouth/Throat:     Mouth: Mucous membranes are moist.     Pharynx: Oropharynx is clear. Uvula midline.  Eyes:     Extraocular Movements: Extraocular movements intact.     Conjunctiva/sclera: Conjunctivae normal.     Pupils: Pupils are equal, round, and reactive to light.  Neck:     Musculoskeletal: Normal range of motion and neck supple.  Cardiovascular:      Rate and Rhythm: Normal rate and regular rhythm.     Heart sounds: Normal heart sounds. No murmur. No friction rub. No gallop.   Pulmonary:     Effort: No accessory muscle usage, prolonged expiration or retractions.     Comments: Patient with increased work of breathing, able to speak in short sentences. Lungs with bilateral ronchi to lower bases. Adequate air movement Chest:     Comments: Diffuse tenderness to palpation of the chest.  Skin:    General: Skin is warm and dry.  Neurological:     General: No focal deficit present.     Mental Status: She is alert and oriented to person, place, and time.    UC Treatments / Results  Labs (all labs ordered are listed, but only abnormal results are displayed) Labs Reviewed  NOVEL CORONAVIRUS, NAA    EKG   Radiology No results found.  Procedures Procedures (including critical care time)  Medications Ordered in UC Medications - No data to display  Initial Impression / Assessment and Plan / UC Course  I have reviewed the triage vital signs and the nursing notes.  Pertinent labs & imaging results that were available during my care of the patient were reviewed by me and considered in my medical decision making (see chart for details).    53 year old female with significant pulmonary history, diabetes comes in for URI symptoms.  She is supposed to be on home oxygen, but self discontinued and monitors with pulse ox.  Since development of URI symptoms, she has had worsening of shortness of breath, and chest pain during cough.  Upon arrival, patient was in respiratory distress, increased work of breathing, and speaking in short sentences.  She was tachypneic at 21, hypoxic around 84 to 85% on room air.  She was put on 2 L oxygen, with O2 sat ranging 91 to 94%.  She had improved breathing, with normal effort once on oxygen.  Lungs with bilateral rhonchi to lower bases, adequate air movement.  COVID testing was sent prior to my examination  given patient's presentation. However, given history and exam, discussed with patient will need further evaluation and management needed. Discussed EMS transport due to need  of O2. Patient declined at this time, stating will have husband bring to ED. Risks discussed. Patient expressed understanding. She will have husband look for home O2 and if able to, will be on home O2 while husband transports to ED. O2 discontinued at urgent care upon discharge, patient in stable condition at this time.  Final Clinical Impressions(s) / UC Diagnoses   Final diagnoses:  Cough in adult  Acute respiratory failure with hypoxia [facility])   ED Prescriptions    None     PDMP not reviewed this encounter.   Ok Edwards, PA-C 02/21/19 1830    Ok Edwards, PA-C 02/21/19 [place], [place], PA-C 02/21/19 [phone]

## 2019-02-21 NOTE — ED Provider Notes (Signed)
53 year old female received at signout from Dr. Tamera Punt pending ambulating on pulse ox.  Per her HPI:  "[name] is a 53 y.o. female.   Patient is a 53 year old female with a history of COPD who presents with shortness of breath.  She states over the last 3 days she has had some URI symptoms with runny nose congestion sore throat.  She has a bifrontal type headache.  She has had coughing which is mostly nonproductive and some soreness across her chest with the coughing.  She has some chronic leg swelling which she says is unchanged from baseline.  She has had some chills but no known fevers.  No vomiting or diarrhea.  She previously has been on home oxygen but she no longer uses it.  She says that she monitors her oxygen saturation at home.  She says been doing okay in the 90s although today at rest it seemed to drop down into the lower 90s or upper 80s.  She was seen in urgent care and sent here for further evaluation.  She has been using Mucinex and some over-the-counter medicines with no improvement in symptoms.  She also uses an albuterol inhaler with no improvement in symptoms."  Physical Exam  BP 140/68   Pulse 81   Temp 98.4 F (36.9 C) (Oral)   Resp 12   Ht 5' 4.5" (1.638 m)   Wt 119.7 kg   SpO2 93%   BMI 44.62 kg/m   Physical Exam Vitals signs and nursing note reviewed.  Constitutional:      General: She is not in acute distress.    Appearance: She is not ill-appearing, toxic-appearing or diaphoretic.  HENT:     Head: Normocephalic.     Comments: Purulent effusion noted to the left TM.  There is a hole in the right TM, which the patient says has been there for years.  No otorrhea is noted.  No mastoid tenderness bilaterally. Eyes:     Conjunctiva/sclera: Conjunctivae normal.  Neck:     Musculoskeletal: Neck supple.  Cardiovascular:     Rate and Rhythm: Normal rate and regular rhythm.     Heart sounds: No murmur. No friction rub. No gallop.   Pulmonary:      Effort: Pulmonary effort is normal. No respiratory distress.     Breath sounds: Wheezing present.     Comments: End expiratory wheezes noted in the bilateral mid lung fields and bases.  No rhonchi or rales.  No accessory muscle use, retractions, or respiratory distress. Abdominal:     General: There is no distension.     Palpations: Abdomen is soft.  Skin:    General: Skin is warm.     Findings: No rash.  Neurological:     Mental Status: She is alert.  Psychiatric:        Behavior: Behavior normal.     ED Course/Procedures     Procedures  MDM   53 year old female received at signout from Dr. Tamera Punt pending ambulation on pulse ox.  Please see her note for further work-up and medical decision making.  In brief, the patient was sent to the ER from urgent care after she was found to have hypoxia in the setting of worsening URI symptoms over the last few days.  -23:30-Patient evaluated by me.  She reports that she felt much better when being ambulated by staff.  She reports that she continues to have a significant headache, but reports that it is somewhat improved from pain  medication earlier.  She does appear to have a left-sided acute otitis media.  This should be covered by doxycycline.  We had a shared decision-making conversation, and she would like to attempt a migraine cocktail to see if her headache improves.  This has been ordered and will plan to re-evaluate.  Following migraine cocktail, the patient reports that her headache is resolved.  She is feeling much better.  She feels much better and isready to be discharged home.  She is not hypoxic on room air.   She has been given Covid-19 precautions and is aware that she has a pending COVID-19 test.  She has been advised to follow-up with primary care.  ER return precautions given.  She is hemodynamically stable in no acute distress.  Safe for discharge to home with outpatient follow-up.       Joline Maxcy A, PA-C 02/22/19  0101    Rolland Porter, MD 02/22/19 [phone]

## 2019-02-21 NOTE — Progress Notes (Addendum)
Virtual Visit via Video Note  Interactive audio and video telecommunications were attempted between myself and the patient, however failed, due to the patient having technical difficulties. We continued and completed the visit with audio only.  I connected with [place] on 02/21/19 at  4:00 PM EDT by a video enabled telemedicine application and verified that I am speaking with the correct person using two identifiers. Location patient: home Location provider: work  Persons participating in the virtual visit: patient, provider  I discussed the limitations of evaluation and management by telemedicine and the availability of in person appointments. The patient expressed understanding and agreed to proceed.  Chief Complaint  Patient presents with  . Headache    Pt alsoc/o ear drainage, runny nose, cough, chestpains and SOB.  This has been going on for 2 days now w/worsening chest pains.  . Cough  . Nasal Congestion    Runny nose     HPI: [name] is a 53 y.o. female complains of headache, fatigue x 2 days. Pt also endorses productive cough. + runny nose, nasal congestion. No fever. + chills this AM. + SOB more so with exertion. + wheeze. + body aches. + chest pain that is getting worse. No known sick contacts. She had a birthday party for her grandson 6 days ago - 15 people in total but 10 live with patient.  + Rt ear pain x 1 week, drainage from ear that is "cloudy thick yellow and it smells".  Past Medical History:  Diagnosis Date  . Anemia   . Anxiety   . Asthmatic bronchitis    normal PFT/ seen by pulmonary no evidence of COPD  . Atypical chest pain 10/05/2017  . Chest pain on respiration 03/25/2014  . Chronic bronchitis ([place])   . Chronic lower back pain   . Chronic respiratory failure ([place]) 10/16/2011   Newly 02 dep 24/7 p discharge from [facility] 01/2013  - 03/13/2014  Walked RA  2 laps @ 185 ft each stopped due to  Sob/ aching in legs, thirsty/ no  desat @ slow pace   . Chronic respiratory failure with hypoxia (HCC)    On 2-3 L of oxygen at home  . Cigarette smoker 12/02/2010   Followed in Pulmonary clinic/ [facility]/ [place]   - Limits of effective care reviewed 12/22/2011    . COPD (chronic obstructive pulmonary disease) ([place])   . COPD with chronic bronchitis ([place]) 10/01/2017  . Daily headache   . Depression   . Diabetic peripheral neuropathy ([place])   . DM (diabetes mellitus) type II controlled, neurological manifestation ([place]) 12/02/2010  . Gastric erosions    EGD 08/2010.  [name] GERD (gastroesophageal reflux disease)   . H/O drug abuse ([place]) 11/12/2017   -- scanned document from outside source: [facility] in [place] which showed UDS positive for Adderall /amphetamine usage as well as positive urine for THC.  This test result was collected 08/11/2016 and reported 10/07/2016. - lso review of the chart shows another positive amphetamine, THC and METH in the urine back on 10/18/2015 under "care everywhere". ---So   . Heavy menses   . High cholesterol   . History of blood transfusion    "related to low HgB" (10/05/2017)  . History of hiatal hernia   . HTN (hypertension)   . Hyperlipidemia associated with type 2 diabetes mellitus ([place]) 10/18/2015  . Hypertension associated with diabetes ([place]) 12/02/2010   D/c acei 12/22/2011 due to  psuedowheeze and narcotic dependent cough> ? Improved - [id number] started bystolic in place of cozar due to cough    . Increased urinary protein excretion   . Internal hemorrhoids    Colonoscopy 5/12.  . Iron deficiency anemia 10/01/2017  . Mixed diabetic hyperlipidemia associated with type 2 diabetes mellitus ([place]) 10/01/2017  . On home oxygen therapy    "5L at night" (10/05/2017)  . OSA on CPAP   . Osteoarthritis    "back" (10/05/2017)  . Oxygen dependent 10/16/2011  . Pneumonia    "lots of times" (10/05/2017)  . Poorly controlled diabetes mellitus ([place]) 11/12/2017  .  Pulmonary infiltrates 12/22/2011   Followed in Pulmonary clinic/ [facility]/ [place]    - See CT Chest 05/05/11   . Shortness of breath   . Tachycardia    never had test done since no insurance  . Tobacco use disorder-current smoker greater than 40-pack-year history- since age 2 1 ppd 10/01/2017  . Type II diabetes mellitus ([place])     Past Surgical History:  Procedure Laterality Date  . CESAREAN SECTION  1988; 1989  . CHOLECYSTECTOMY OPEN  1990  . COLONOSCOPY  09/16/2010   [id number] colon/small internal hemorrhoids  . ESOPHAGOGASTRODUODENOSCOPY  09/16/2010   SLF: normal/mild gastritis  . ESOPHAGOGASTRODUODENOSCOPY N/A 10/19/2014   Procedure: ESOPHAGOGASTRODUODENOSCOPY (EGD);  Surgeon: Danie Binder, MD;  Location: [facility] ENDO SUITE;  Service: Endoscopy;  Laterality: N/A;  830  . FRACTURE SURGERY    . HYSTEROSCOPY WITH THERMACHOICE  01/17/2012   Procedure: HYSTEROSCOPY WITH THERMACHOICE;  Surgeon: Florian Buff, MD;  Location: [facility] ORS;  Service: Gynecology;  Laterality: N/A;  total therapy time: 9:13sec  D5W  18 ml in, D5W   13ml out, temperture 87degrees celcious  . KIDNEY SURGERY     as child for blockages  . TONSILLECTOMY    . TUBAL LIGATION  1989  . TYMPANOSTOMY TUBE PLACEMENT Bilateral    "several times when I was a child"  . uterine ablation    . WRIST FRACTURE SURGERY Left 1995    Family History  Problem Relation Age of Onset  . Heart attack Father 39       deceased, etoh use  . Heart disease Father   . Alcohol abuse Father   . Depression Father   . Heart attack Mother 23       deceased  . Diabetes Mother   . Breast cancer Mother   . Heart failure Mother        oxygen dependence, nonsmoker  . Heart disease Mother   . Depression Mother   . Cancer Mother   . Liver disease Maternal Aunt 24       died while on liver transplant list  . Heart attack Maternal Grandmother        premature CAD  . Ulcers Sister   . Hypertension Sister   . Colon cancer Neg Hx     Social  History   Tobacco Use  . Smoking status: Current Every Day Smoker    Packs/day: 1.00    Years: 39.00    Pack years: 39.00    Types: Cigarettes  . Smokeless tobacco: Never Used  Substance Use Topics  . Alcohol use: Yes    Comment: 10/05/2017 "5 - 8 shots tequilia//month"  . Drug use: Yes    Types: Marijuana    Comment: 10/05/2017 "a few times/wk"     Current Outpatient Medications:  .  acetaminophen (TYLENOL) 500 MG tablet, Take 1,000 mg  by mouth every 6 (six) hours as needed for mild pain or headache., Disp: , Rfl:  .  ALPRAZolam (XANAX) 0.5 MG tablet, Take 1 tablet (0.5 mg total) by mouth daily as needed for anxiety., Disp: 30 tablet, Rfl: 2 .  amphetamine-dextroamphetamine (ADDERALL) 30 MG tablet, Take 1 tablet by mouth 2 (two) times daily., Disp: 60 tablet, Rfl: 0 .  aspirin EC 81 MG tablet, Take 1 tablet (81 mg total) by mouth daily., Disp: 90 tablet, Rfl: 3 .  atorvastatin (LIPITOR) 80 MG tablet, Take 1 tablet (80 mg total) by mouth daily., Disp: 90 tablet, Rfl: 3 .  canagliflozin (INVOKANA) 300 MG TABS tablet, Take 1 tablet (300 mg total) by mouth daily before breakfast., Disp: 90 tablet, Rfl: 1 .  dextromethorphan-guaiFENesin (MUCINEX DM) 30-600 MG 12hr tablet, Take 1 tablet by mouth 2 (two) times daily., Disp: , Rfl:  .  dicyclomine (BENTYL) 10 MG capsule, Take 1 capsule (10 mg total) by mouth every 8 (eight) hours as needed for spasms., Disp: 30 capsule, Rfl: 3 .  DULoxetine (CYMBALTA) 60 MG capsule, TAKE 1 CAPSULE(60 MG) BY MOUTH AT BEDTIME, Disp: 30 capsule, Rfl: 2 .  fluticasone (FLONASE) 50 MCG/ACT nasal spray, USE 1 SPRAY IN EACH NOSTRIL TWICE DAILY AFTER SINUS RINSES, Disp: 16 g, Rfl: 2 .  Fluticasone-Salmeterol (ADVAIR DISKUS) 250-50 MCG/DOSE AEPB, Inhale 1 puff into the lungs 2 (two) times daily. Flush mouth after each use- NO RF's NEEDS OV, Disp: 1 each, Rfl: 0 .  ibuprofen (ADVIL) 800 MG tablet, Take 1 tablet (800 mg total) by mouth every 8 (eight) hours as needed.,  Disp: 30 tablet, Rfl: 0 .  isosorbide mononitrate (IMDUR) 30 MG 24 hr tablet, TAKE 1 TABLET BY MOUTH EVERY DAY, Disp: 90 tablet, Rfl: 3 .  liraglutide (VICTOZA) 18 MG/3ML SOPN, INJECT 0.3 MLS (1.8 MG TOTAL) INTO THE SKIN DAILY., Disp: 27 mL, Rfl: 1 .  losartan (COZAAR) 25 MG tablet, Take 1 tablet (25 mg total) by mouth daily., Disp: 90 tablet, Rfl: 3 .  meclizine (ANTIVERT) 25 MG tablet, Take 1 tablet (25 mg total) by mouth 3 (three) times daily as needed for dizziness or nausea., Disp: 30 tablet, Rfl: 0 .  pantoprazole (PROTONIX) 40 MG tablet, Take 1 tablet (40 mg total) by mouth daily. 30 minutes before a meal., Disp: 90 tablet, Rfl: 3 .  polyethylene glycol (MIRALAX) 17 g packet, Take 17 g by mouth daily., Disp: 14 each, Rfl: 0 .  pregabalin (LYRICA) 150 MG capsule, Take 1 capsule (150 mg total) by mouth 2 (two) times daily. No ReFills, Disp: 180 capsule, Rfl: 1 .  Pseudoeph-Doxylamine-DM-APAP (DAYQUIL/NYQUIL COLD/FLU RELIEF PO), Take by mouth., Disp: , Rfl:  .  BD PEN NEEDLE NANO U/F 32G X 4 MM MISC, USE AS DIRECTED WITH VICTOZA, Disp: 100 each, Rfl: 1 .  Vitamin D, Ergocalciferol, (DRISDOL) 50000 units CAPS capsule, Take 1 capsule (50,000 Units total) by mouth every 7 (seven) days. (Patient not taking: Reported on 02/21/2019), Disp: 12 capsule, Rfl: 10  Current Facility-Administered Medications:  .  ipratropium-albuterol (DUONEB) 0.5-2.5 (3) MG/3ML nebulizer solution 3 mL, 3 mL, Nebulization, Q6H, Opalski, Deborah, DO, 3 mL at 01/10/18 1042  Allergies  Allergen Reactions  . Codeine Itching and Nausea Only  . Metformin And Related Diarrhea and Other (See Comments)    Stomach pain/nausea  . Wellbutrin [Bupropion Hcl] Hives  . Acyclovir And Related Rash      ROS: See pertinent positives and negatives per HPI.   EXAM:  VITALS per patient if applicable: Pulse (!) [id number]   Temp 98.8 F (37.1 C) (Oral)   Ht 5\' 4"  (1.626 m)   SpO2 97%   BMI 45.32 kg/m    GENERAL: alert, oriented, no  acute distress  LUNGS: pt sounds SOB, no gasping or wheezing, + conversational dyspnea  PSYCH/NEURO: speech and thought processing grossly intact   ASSESSMENT AND PLAN: 1. Suspected COVID-19 virus infection - pt with 2 days of headache, myalgias, chills, cough, increased SOB from baseline, chest pain. Pt has significant pulmonary medical hx and is a smoker - advised pt to seek evaluation and care and covid testing at [facility]. She will go to [place] at [facility] now  2. Acute ear pain, right - likely infectious etiology as pt describes purulent drainage from ear - as noted above in #1, pt will seek evaluation and care at UC  This patient was referred for an in-person appt/evaluation at [facility] due to concern for possible COVID infection along with increased SOB, wheeze, chest pain.  I personally spent 15 min on the telephone with the patient during this encounter.   Letta Median, DO

## 2019-02-21 NOTE — ED Triage Notes (Signed)
Pt states she was seen at [facility] and they recommended that she come here for an x-ray. SOB, productive cough with yellow sputum, 9/10 HA, generalized body aches x3 days. Reports taking tylenol, no fever reported. Oxygen saturation drops to 88%, while talking mid 90s.

## 2019-02-21 NOTE — ED Notes (Addendum)
Ambulated pt around room multiple times. O2 improved while walking and stayed between 94-96% on RA.

## 2019-02-21 NOTE — Discharge Instructions (Addendum)
53 year old female with significant pulmonary history, diabetes comes in for 3-day history of URI symptoms. Patient is supposed to be on home oxygen, self discontinued after buying pulse ox.  States she puts herself back on oxygen when she is sick, and oxygen level drops below 90. Not currently using any home O2.  Patient complains of shortness of breath, chest pain during cough, did virtual visit with PCP, and was sent over for evaluation. O2 sat baseline is above 94% without O2.   Patient was hypoxic at 84 to 85% in room air during arrival.  She is unable to speak in full sentences.  She is afebrile on Tylenol.  And tachypneic.  She was put on 2 L oxygen, with O2 sat ranging 90-92%.  RRR. Bilateral rhochi to lower bases. Patient coughing throughout exam.  Given history and exam, patient discharged to the ED for further evaluation and management needed.

## 2019-02-22 LAB — SARS CORONAVIRUS 2 (TAT 6-24 HRS): SARS Coronavirus 2: NEGATIVE

## 2019-02-23 ENCOUNTER — Other Ambulatory Visit: Payer: Self-pay | Admitting: Family Medicine

## 2019-02-23 LAB — NOVEL CORONAVIRUS, NAA: SARS-CoV-2, NAA: NOT DETECTED

## 2019-03-04 ENCOUNTER — Emergency Department (HOSPITAL_COMMUNITY): Payer: Medicare HMO

## 2019-03-04 ENCOUNTER — Inpatient Hospital Stay (HOSPITAL_COMMUNITY)
Admission: EM | Admit: 2019-03-04 | Discharge: 2019-03-07 | DRG: 917 | Disposition: A | Discharge status: Other Acute Inpatient Hospital | Payer: Medicare HMO | Attending: Internal Medicine | Admitting: Internal Medicine

## 2019-03-04 ENCOUNTER — Encounter (HOSPITAL_COMMUNITY): Payer: Self-pay | Admitting: Emergency Medicine

## 2019-03-04 ENCOUNTER — Other Ambulatory Visit: Payer: Self-pay

## 2019-03-04 DIAGNOSIS — J9610 Chronic respiratory failure, unspecified whether with hypoxia or hypercapnia: Secondary | ICD-10-CM | POA: Diagnosis present

## 2019-03-04 DIAGNOSIS — T424X1A Poisoning by benzodiazepines, accidental (unintentional), initial encounter: Secondary | ICD-10-CM | POA: Diagnosis present

## 2019-03-04 DIAGNOSIS — R059 Cough, unspecified: Secondary | ICD-10-CM

## 2019-03-04 DIAGNOSIS — T50904A Poisoning by unspecified drugs, medicaments and biological substances, undetermined, initial encounter: Secondary | ICD-10-CM

## 2019-03-04 DIAGNOSIS — Z833 Family history of diabetes mellitus: Secondary | ICD-10-CM

## 2019-03-04 DIAGNOSIS — F332 Major depressive disorder, recurrent severe without psychotic features: Secondary | ICD-10-CM | POA: Diagnosis present

## 2019-03-04 DIAGNOSIS — Z811 Family history of alcohol abuse and dependence: Secondary | ICD-10-CM

## 2019-03-04 DIAGNOSIS — Z818 Family history of other mental and behavioral disorders: Secondary | ICD-10-CM

## 2019-03-04 DIAGNOSIS — E872 Acidosis: Secondary | ICD-10-CM | POA: Diagnosis present

## 2019-03-04 DIAGNOSIS — K219 Gastro-esophageal reflux disease without esophagitis: Secondary | ICD-10-CM | POA: Diagnosis present

## 2019-03-04 DIAGNOSIS — R062 Wheezing: Secondary | ICD-10-CM

## 2019-03-04 DIAGNOSIS — D72829 Elevated white blood cell count, unspecified: Secondary | ICD-10-CM | POA: Diagnosis present

## 2019-03-04 DIAGNOSIS — Z7982 Long term (current) use of aspirin: Secondary | ICD-10-CM

## 2019-03-04 DIAGNOSIS — Z915 Personal history of self-harm: Secondary | ICD-10-CM

## 2019-03-04 DIAGNOSIS — Z9981 Dependence on supplemental oxygen: Secondary | ICD-10-CM

## 2019-03-04 DIAGNOSIS — Z8249 Family history of ischemic heart disease and other diseases of the circulatory system: Secondary | ICD-10-CM

## 2019-03-04 DIAGNOSIS — J449 Chronic obstructive pulmonary disease, unspecified: Secondary | ICD-10-CM | POA: Diagnosis present

## 2019-03-04 DIAGNOSIS — Z20828 Contact with and (suspected) exposure to other viral communicable diseases: Secondary | ICD-10-CM | POA: Diagnosis present

## 2019-03-04 DIAGNOSIS — Z4659 Encounter for fitting and adjustment of other gastrointestinal appliance and device: Secondary | ICD-10-CM

## 2019-03-04 DIAGNOSIS — F17210 Nicotine dependence, cigarettes, uncomplicated: Secondary | ICD-10-CM | POA: Diagnosis present

## 2019-03-04 DIAGNOSIS — I152 Hypertension secondary to endocrine disorders: Secondary | ICD-10-CM | POA: Diagnosis present

## 2019-03-04 DIAGNOSIS — F909 Attention-deficit hyperactivity disorder, unspecified type: Secondary | ICD-10-CM | POA: Diagnosis present

## 2019-03-04 DIAGNOSIS — E1142 Type 2 diabetes mellitus with diabetic polyneuropathy: Secondary | ICD-10-CM | POA: Diagnosis present

## 2019-03-04 DIAGNOSIS — R05 Cough: Secondary | ICD-10-CM

## 2019-03-04 DIAGNOSIS — E782 Mixed hyperlipidemia: Secondary | ICD-10-CM | POA: Diagnosis present

## 2019-03-04 DIAGNOSIS — F419 Anxiety disorder, unspecified: Secondary | ICD-10-CM | POA: Diagnosis present

## 2019-03-04 DIAGNOSIS — D509 Iron deficiency anemia, unspecified: Secondary | ICD-10-CM | POA: Diagnosis present

## 2019-03-04 DIAGNOSIS — Z7952 Long term (current) use of systemic steroids: Secondary | ICD-10-CM

## 2019-03-04 DIAGNOSIS — F1290 Cannabis use, unspecified, uncomplicated: Secondary | ICD-10-CM | POA: Diagnosis present

## 2019-03-04 DIAGNOSIS — Z7984 Long term (current) use of oral hypoglycemic drugs: Secondary | ICD-10-CM

## 2019-03-04 DIAGNOSIS — G4733 Obstructive sleep apnea (adult) (pediatric): Secondary | ICD-10-CM | POA: Diagnosis present

## 2019-03-04 DIAGNOSIS — Z6841 Body Mass Index (BMI) 40.0 and over, adult: Secondary | ICD-10-CM

## 2019-03-04 DIAGNOSIS — T424X2A Poisoning by benzodiazepines, intentional self-harm, initial encounter: Secondary | ICD-10-CM | POA: Diagnosis not present

## 2019-03-04 DIAGNOSIS — Z79899 Other long term (current) drug therapy: Secondary | ICD-10-CM

## 2019-03-04 DIAGNOSIS — E1169 Type 2 diabetes mellitus with other specified complication: Secondary | ICD-10-CM | POA: Diagnosis present

## 2019-03-04 DIAGNOSIS — I2510 Atherosclerotic heart disease of native coronary artery without angina pectoris: Secondary | ICD-10-CM | POA: Diagnosis present

## 2019-03-04 DIAGNOSIS — E1165 Type 2 diabetes mellitus with hyperglycemia: Secondary | ICD-10-CM | POA: Diagnosis present

## 2019-03-04 DIAGNOSIS — E7800 Pure hypercholesterolemia, unspecified: Secondary | ICD-10-CM | POA: Diagnosis present

## 2019-03-04 DIAGNOSIS — G92 Toxic encephalopathy: Secondary | ICD-10-CM | POA: Diagnosis present

## 2019-03-04 LAB — CBG MONITORING, ED: Glucose-Capillary: 167 mg/dL — ABNORMAL HIGH (ref 70–99)

## 2019-03-04 NOTE — ED Triage Notes (Signed)
Arrives via EMS, husband called bc she took 15-20 xanax, unknown dosage, when fire arrived patient was walking and talking, with EMS she started to lose consciousness, assisted to floor. Patient stated she 'did not want to live anymore.' 12 lead unremarkable, 94% RA, 100% 10 L O2. Responsive to voice currently.

## 2019-03-05 ENCOUNTER — Encounter (HOSPITAL_COMMUNITY): Payer: Self-pay | Admitting: Emergency Medicine

## 2019-03-05 DIAGNOSIS — E1142 Type 2 diabetes mellitus with diabetic polyneuropathy: Secondary | ICD-10-CM | POA: Diagnosis present

## 2019-03-05 DIAGNOSIS — T424X2A Poisoning by benzodiazepines, intentional self-harm, initial encounter: Principal | ICD-10-CM

## 2019-03-05 DIAGNOSIS — Z20828 Contact with and (suspected) exposure to other viral communicable diseases: Secondary | ICD-10-CM | POA: Diagnosis present

## 2019-03-05 DIAGNOSIS — I152 Hypertension secondary to endocrine disorders: Secondary | ICD-10-CM | POA: Diagnosis present

## 2019-03-05 DIAGNOSIS — F1290 Cannabis use, unspecified, uncomplicated: Secondary | ICD-10-CM | POA: Diagnosis present

## 2019-03-05 DIAGNOSIS — T424X1A Poisoning by benzodiazepines, accidental (unintentional), initial encounter: Secondary | ICD-10-CM | POA: Diagnosis present

## 2019-03-05 DIAGNOSIS — F419 Anxiety disorder, unspecified: Secondary | ICD-10-CM | POA: Diagnosis present

## 2019-03-05 DIAGNOSIS — F322 Major depressive disorder, single episode, severe without psychotic features: Secondary | ICD-10-CM | POA: Diagnosis not present

## 2019-03-05 DIAGNOSIS — I2510 Atherosclerotic heart disease of native coronary artery without angina pectoris: Secondary | ICD-10-CM | POA: Diagnosis present

## 2019-03-05 DIAGNOSIS — F909 Attention-deficit hyperactivity disorder, unspecified type: Secondary | ICD-10-CM | POA: Diagnosis present

## 2019-03-05 DIAGNOSIS — D509 Iron deficiency anemia, unspecified: Secondary | ICD-10-CM | POA: Diagnosis present

## 2019-03-05 DIAGNOSIS — F17210 Nicotine dependence, cigarettes, uncomplicated: Secondary | ICD-10-CM | POA: Diagnosis present

## 2019-03-05 DIAGNOSIS — J9610 Chronic respiratory failure, unspecified whether with hypoxia or hypercapnia: Secondary | ICD-10-CM | POA: Diagnosis present

## 2019-03-05 DIAGNOSIS — Z9981 Dependence on supplemental oxygen: Secondary | ICD-10-CM | POA: Diagnosis not present

## 2019-03-05 DIAGNOSIS — T1491XA Suicide attempt, initial encounter: Secondary | ICD-10-CM | POA: Diagnosis not present

## 2019-03-05 DIAGNOSIS — G4733 Obstructive sleep apnea (adult) (pediatric): Secondary | ICD-10-CM | POA: Diagnosis present

## 2019-03-05 DIAGNOSIS — G92 Toxic encephalopathy: Secondary | ICD-10-CM | POA: Diagnosis present

## 2019-03-05 DIAGNOSIS — E119 Type 2 diabetes mellitus without complications: Secondary | ICD-10-CM | POA: Diagnosis not present

## 2019-03-05 DIAGNOSIS — K219 Gastro-esophageal reflux disease without esophagitis: Secondary | ICD-10-CM | POA: Diagnosis present

## 2019-03-05 DIAGNOSIS — E872 Acidosis: Secondary | ICD-10-CM | POA: Diagnosis present

## 2019-03-05 DIAGNOSIS — J449 Chronic obstructive pulmonary disease, unspecified: Secondary | ICD-10-CM | POA: Diagnosis present

## 2019-03-05 DIAGNOSIS — F332 Major depressive disorder, recurrent severe without psychotic features: Secondary | ICD-10-CM | POA: Diagnosis present

## 2019-03-05 DIAGNOSIS — E1165 Type 2 diabetes mellitus with hyperglycemia: Secondary | ICD-10-CM | POA: Diagnosis present

## 2019-03-05 DIAGNOSIS — E1169 Type 2 diabetes mellitus with other specified complication: Secondary | ICD-10-CM | POA: Diagnosis present

## 2019-03-05 DIAGNOSIS — I10 Essential (primary) hypertension: Secondary | ICD-10-CM | POA: Diagnosis not present

## 2019-03-05 DIAGNOSIS — D72829 Elevated white blood cell count, unspecified: Secondary | ICD-10-CM | POA: Diagnosis present

## 2019-03-05 DIAGNOSIS — Z6841 Body Mass Index (BMI) 40.0 and over, adult: Secondary | ICD-10-CM | POA: Diagnosis not present

## 2019-03-05 DIAGNOSIS — E7800 Pure hypercholesterolemia, unspecified: Secondary | ICD-10-CM | POA: Diagnosis present

## 2019-03-05 LAB — CBC WITH DIFFERENTIAL/PLATELET
Abs Immature Granulocytes: 0.15 10*3/uL — ABNORMAL HIGH (ref 0.00–0.07)
Basophils Absolute: 0.1 10*3/uL (ref 0.0–0.1)
Basophils Relative: 0 %
Eosinophils Absolute: 0.1 10*3/uL (ref 0.0–0.5)
Eosinophils Relative: 1 %
HCT: 47.6 % — ABNORMAL HIGH (ref 36.0–46.0)
Hemoglobin: 14.1 g/dL (ref 12.0–15.0)
Immature Granulocytes: 1 %
Lymphocytes Relative: 14 %
Lymphs Abs: 3 10*3/uL (ref 0.7–4.0)
MCH: 25.5 pg — ABNORMAL LOW (ref 26.0–34.0)
MCHC: 29.6 g/dL — ABNORMAL LOW (ref 30.0–36.0)
MCV: 85.9 fL (ref 80.0–100.0)
Monocytes Absolute: 1.1 10*3/uL — ABNORMAL HIGH (ref 0.1–1.0)
Monocytes Relative: 5 %
Neutro Abs: 17.4 10*3/uL — ABNORMAL HIGH (ref 1.7–7.7)
Neutrophils Relative %: 79 %
Platelets: 248 10*3/uL (ref 150–400)
RBC: 5.54 MIL/uL — ABNORMAL HIGH (ref 3.87–5.11)
RDW: 17.8 % — ABNORMAL HIGH (ref 11.5–15.5)
WBC: 21.8 10*3/uL — ABNORMAL HIGH (ref 4.0–10.5)
nRBC: 0 % (ref 0.0–0.2)

## 2019-03-05 LAB — COMPREHENSIVE METABOLIC PANEL
ALT: 22 U/L (ref 0–44)
ALT: 23 U/L (ref 0–44)
AST: 18 U/L (ref 15–41)
AST: 20 U/L (ref 15–41)
Albumin: 3.5 g/dL (ref 3.5–5.0)
Albumin: 4.1 g/dL (ref 3.5–5.0)
Alkaline Phosphatase: 61 U/L (ref 38–126)
Alkaline Phosphatase: 68 U/L (ref 38–126)
Anion gap: 9 (ref 5–15)
Anion gap: 9 (ref 5–15)
BUN: 8 mg/dL (ref 6–20)
BUN: 9 mg/dL (ref 6–20)
CO2: 28 mmol/L (ref 22–32)
CO2: 29 mmol/L (ref 22–32)
Calcium: 8.9 mg/dL (ref 8.9–10.3)
Calcium: 9.1 mg/dL (ref 8.9–10.3)
Chloride: 101 mmol/L (ref 98–111)
Chloride: 103 mmol/L (ref 98–111)
Creatinine, Ser: 0.59 mg/dL (ref 0.44–1.00)
Creatinine, Ser: 0.73 mg/dL (ref 0.44–1.00)
GFR calc Af Amer: >60 mL/min (ref >60)
GFR calc Af Amer: >60 mL/min (ref >60)
GFR calc non Af Amer: >60 mL/min (ref >60)
GFR calc non Af Amer: >60 mL/min (ref >60)
Glucose, Bld: 166 mg/dL — ABNORMAL HIGH (ref 70–99)
Glucose, Bld: 210 mg/dL — ABNORMAL HIGH (ref 70–99)
Potassium: 3.6 mmol/L (ref 3.5–5.1)
Potassium: 3.7 mmol/L (ref 3.5–5.1)
Sodium: 138 mmol/L (ref 135–145)
Sodium: 141 mmol/L (ref 135–145)
Total Bilirubin: 0.5 mg/dL (ref 0.3–1.2)
Total Bilirubin: 0.6 mg/dL (ref 0.3–1.2)
Total Protein: 6.9 g/dL (ref 6.5–8.1)
Total Protein: 7.8 g/dL (ref 6.5–8.1)

## 2019-03-05 LAB — PHOSPHORUS: Phosphorus: 3.6 mg/dL (ref 2.5–4.6)

## 2019-03-05 LAB — BLOOD GAS, ARTERIAL
Acid-Base Excess: 1.1 mmol/L (ref 0.0–2.0)
Bicarbonate: 28.4 mmol/L — ABNORMAL HIGH (ref 20.0–28.0)
FIO2: 100
O2 Saturation: 93.2 %
Patient temperature: 97.7
pCO2 arterial: 57.1 mmHg — ABNORMAL HIGH (ref 32.0–48.0)
pH, Arterial: 7.315 — ABNORMAL LOW (ref 7.350–7.450)
pO2, Arterial: 72.2 mmHg — ABNORMAL LOW (ref 83.0–108.0)

## 2019-03-05 LAB — CBC
HCT: 50.7 % — ABNORMAL HIGH (ref 36.0–46.0)
Hemoglobin: 15.3 g/dL — ABNORMAL HIGH (ref 12.0–15.0)
MCH: 25.6 pg — ABNORMAL LOW (ref 26.0–34.0)
MCHC: 30.2 g/dL (ref 30.0–36.0)
MCV: 84.9 fL (ref 80.0–100.0)
Platelets: 250 10*3/uL (ref 150–400)
RBC: 5.97 MIL/uL — ABNORMAL HIGH (ref 3.87–5.11)
RDW: 18.5 % — ABNORMAL HIGH (ref 11.5–15.5)
WBC: 15.3 10*3/uL — ABNORMAL HIGH (ref 4.0–10.5)
nRBC: 0 % (ref 0.0–0.2)

## 2019-03-05 LAB — HEMOGLOBIN A1C
Hgb A1c MFr Bld: 8.2 % — ABNORMAL HIGH (ref 4.8–5.6)
Mean Plasma Glucose: 188.64 mg/dL

## 2019-03-05 LAB — PROTIME-INR
INR: 1 (ref 0.8–1.2)
Prothrombin Time: 12.9 seconds (ref 11.4–15.2)

## 2019-03-05 LAB — GLUCOSE, CAPILLARY
Glucose-Capillary: 209 mg/dL — ABNORMAL HIGH (ref 70–99)
Glucose-Capillary: 200 mg/dL — ABNORMAL HIGH (ref 70–99)
Glucose-Capillary: 186 mg/dL — ABNORMAL HIGH (ref 70–99)
Glucose-Capillary: 224 mg/dL — ABNORMAL HIGH (ref 70–99)

## 2019-03-05 LAB — PREGNANCY, URINE: Preg Test, Ur: NEGATIVE

## 2019-03-05 LAB — CBG MONITORING, ED: Glucose-Capillary: 193 mg/dL — ABNORMAL HIGH (ref 70–99)

## 2019-03-05 LAB — RAPID URINE DRUG SCREEN, HOSP PERFORMED
Amphetamines: NOT DETECTED
Barbiturates: NOT DETECTED
Benzodiazepines: POSITIVE — AB
Cocaine: NOT DETECTED
Opiates: NOT DETECTED
Tetrahydrocannabinol: POSITIVE — AB

## 2019-03-05 LAB — SARS CORONAVIRUS 2 (TAT 6-24 HRS): SARS Coronavirus 2: NEGATIVE

## 2019-03-05 LAB — MRSA PCR SCREENING: MRSA by PCR: NEGATIVE

## 2019-03-05 LAB — ETHANOL: Alcohol, Ethyl (B): <10 mg/dL (ref <10)

## 2019-03-05 LAB — MAGNESIUM: Magnesium: 2 mg/dL (ref 1.7–2.4)

## 2019-03-05 LAB — SARS CORONAVIRUS 2 BY RT PCR (HOSPITAL ORDER, PERFORMED IN ~~LOC~~ HOSPITAL LAB): SARS Coronavirus 2: NEGATIVE

## 2019-03-05 LAB — SALICYLATE LEVEL: Salicylate Lvl: <7 mg/dL (ref 2.8–30.0)

## 2019-03-05 LAB — ACETAMINOPHEN LEVEL: Acetaminophen (Tylenol), Serum: <10 ug/mL — ABNORMAL LOW (ref 10–30)

## 2019-03-05 LAB — HIV ANTIBODY (ROUTINE TESTING W REFLEX): HIV Screen 4th Generation wRfx: NONREACTIVE

## 2019-03-05 MED ORDER — PREGABALIN 50 MG PO CAPS
100.0000 mg | ORAL_CAPSULE | Freq: Two times a day (BID) | ORAL | Status: DC
  Administered 2019-03-05 – 2019-03-07 (×5): 100 mg via ORAL
  Filled 2019-03-05: qty 2
  Filled 2019-03-05: qty 1
  Filled 2019-03-05: qty 2
  Filled 2019-03-05 (×2): qty 1

## 2019-03-05 MED ORDER — ETOMIDATE 2 MG/ML IV SOLN
INTRAVENOUS | Status: AC | PRN
  Administered 2019-03-04: 20 mg via INTRAVENOUS

## 2019-03-05 MED ORDER — ROCURONIUM BROMIDE 50 MG/5ML IV SOLN
INTRAVENOUS | Status: AC | PRN
  Administered 2019-03-04: 80 mg via INTRAVENOUS

## 2019-03-05 MED ORDER — VECURONIUM BROMIDE 10 MG IV SOLR
10.0000 mg | Freq: Once | INTRAVENOUS | Status: AC
  Administered 2019-03-05: 10 mg via INTRAVENOUS
  Filled 2019-03-05: qty 10

## 2019-03-05 MED ORDER — ATORVASTATIN CALCIUM 40 MG PO TABS
80.0000 mg | ORAL_TABLET | Freq: Every day | ORAL | Status: DC
  Administered 2019-03-05 – 2019-03-07 (×3): 80 mg via ORAL
  Filled 2019-03-05 (×3): qty 2

## 2019-03-05 MED ORDER — FENTANYL CITRATE (PF) 100 MCG/2ML IJ SOLN
100.0000 ug | INTRAMUSCULAR | Status: DC | PRN
  Administered 2019-03-05 (×2): 100 ug via INTRAVENOUS
  Filled 2019-03-05 (×2): qty 2

## 2019-03-05 MED ORDER — PANTOPRAZOLE SODIUM 40 MG IV SOLR
40.0000 mg | Freq: Every day | INTRAVENOUS | Status: DC
  Administered 2019-03-05: 21:00:00 40 mg via INTRAVENOUS
  Filled 2019-03-05 (×2): qty 40

## 2019-03-05 MED ORDER — KCL IN DEXTROSE-NACL 10-5-0.45 MEQ/L-%-% IV SOLN
INTRAVENOUS | Status: DC
  Administered 2019-03-05 – 2019-03-06 (×3): via INTRAVENOUS
  Filled 2019-03-05 (×4): qty 1000

## 2019-03-05 MED ORDER — CHLORHEXIDINE GLUCONATE 0.12% ORAL RINSE (MEDLINE KIT)
15.0000 mL | Freq: Two times a day (BID) | OROMUCOSAL | Status: DC
  Administered 2019-03-05 (×2): 15 mL via OROMUCOSAL

## 2019-03-05 MED ORDER — CHLORHEXIDINE GLUCONATE CLOTH 2 % EX PADS
6.0000 | MEDICATED_PAD | Freq: Every day | CUTANEOUS | Status: DC
  Administered 2019-03-05 – 2019-03-06 (×2): 6 via TOPICAL

## 2019-03-05 MED ORDER — STERILE WATER FOR INJECTION IJ SOLN
INTRAMUSCULAR | Status: AC
  Administered 2019-03-05: 03:00:00
  Filled 2019-03-05: qty 10

## 2019-03-05 MED ORDER — PROPOFOL 1000 MG/100ML IV EMUL
5.0000 ug/kg/min | INTRAVENOUS | Status: DC
  Administered 2019-03-05: 30.05 ug/kg/min via INTRAVENOUS
  Administered 2019-03-05: 10 ug/kg/min via INTRAVENOUS
  Filled 2019-03-05 (×3): qty 100

## 2019-03-05 MED ORDER — ACETAMINOPHEN 325 MG PO TABS
650.0000 mg | ORAL_TABLET | ORAL | Status: DC | PRN
  Administered 2019-03-07: 650 mg via ORAL
  Filled 2019-03-05: qty 2

## 2019-03-05 MED ORDER — ONDANSETRON HCL 4 MG/2ML IJ SOLN: 4.0000 mg | Freq: Four times a day (QID) | INTRAMUSCULAR | Status: DC | PRN

## 2019-03-05 MED ORDER — DULOXETINE HCL 30 MG PO CPEP
60.0000 mg | ORAL_CAPSULE | Freq: Every day | ORAL | Status: DC
  Administered 2019-03-05 – 2019-03-07 (×3): 60 mg via ORAL
  Filled 2019-03-05 (×3): qty 2

## 2019-03-05 MED ORDER — ORAL CARE MOUTH RINSE
15.0000 mL | OROMUCOSAL | Status: DC
  Administered 2019-03-05 (×2): 15 mL via OROMUCOSAL

## 2019-03-05 MED ORDER — INSULIN ASPART 100 UNIT/ML ~~LOC~~ SOLN
0.0000 [IU] | SUBCUTANEOUS | Status: DC
  Administered 2019-03-05: 7 [IU] via SUBCUTANEOUS
  Administered 2019-03-05 – 2019-03-06 (×3): 4 [IU] via SUBCUTANEOUS
  Administered 2019-03-06: 11 [IU] via SUBCUTANEOUS
  Administered 2019-03-06: 3 [IU] via SUBCUTANEOUS
  Administered 2019-03-06: 05:00:00 4 [IU] via SUBCUTANEOUS

## 2019-03-05 MED ORDER — ORAL CARE MOUTH RINSE
15.0000 mL | Freq: Two times a day (BID) | OROMUCOSAL | Status: DC
  Administered 2019-03-06 – 2019-03-07 (×3): 15 mL via OROMUCOSAL

## 2019-03-05 MED ORDER — ASPIRIN 81 MG PO CHEW
81.0000 mg | CHEWABLE_TABLET | Freq: Every day | ORAL | Status: DC
  Administered 2019-03-05 – 2019-03-07 (×3): 81 mg
  Filled 2019-03-05 (×3): qty 1

## 2019-03-05 MED ORDER — INSULIN ASPART 100 UNIT/ML ~~LOC~~ SOLN
0.0000 [IU] | SUBCUTANEOUS | Status: DC
  Administered 2019-03-05: 08:00:00 5 [IU] via SUBCUTANEOUS
  Filled 2019-03-05: qty 0.15

## 2019-03-05 MED ORDER — IPRATROPIUM-ALBUTEROL 0.5-2.5 (3) MG/3ML IN SOLN: 3.0000 mL | RESPIRATORY_TRACT | Status: DC | PRN

## 2019-03-05 MED ORDER — ENOXAPARIN SODIUM 40 MG/0.4ML ~~LOC~~ SOLN
40.0000 mg | SUBCUTANEOUS | Status: DC
  Administered 2019-03-05 – 2019-03-07 (×3): 40 mg via SUBCUTANEOUS
  Filled 2019-03-05 (×3): qty 0.4

## 2019-03-05 MED ORDER — VECURONIUM BROMIDE 10 MG IV SOLR
10.0000 mg | Freq: Once | INTRAVENOUS | Status: DC
  Filled 2019-03-05: qty 10

## 2019-03-05 MED ORDER — ASPIRIN 81 MG PO CHEW: 81.0000 mg | CHEWABLE_TABLET | Freq: Every day | ORAL | Status: DC

## 2019-03-05 MED ORDER — NICOTINE 14 MG/24HR TD PT24
14.0000 mg | MEDICATED_PATCH | Freq: Every day | TRANSDERMAL | Status: DC
  Administered 2019-03-05 – 2019-03-07 (×3): 14 mg via TRANSDERMAL
  Filled 2019-03-05 (×3): qty 1

## 2019-03-05 NOTE — ED Notes (Signed)
Urine culture sent with specimen 

## 2019-03-05 NOTE — Plan of Care (Signed)
Patient is a 53 year old female who was admitted to the ICU service status post intentional overdose with Xanax.  She was initially intubated.  Currently extubated.  She also has a history of diabetes mellitus, obstructive sleep apnea, COPD.  Currently has a 1 is to 1 sitter at the bedside.  Will need psych evaluation.  Patient will be transferred out of the ICU.  Tried hospitalists to resume care from tomorrow.

## 2019-03-05 NOTE — ED Provider Notes (Signed)
[facility] DEPT Provider Note   CSN: [id number] Arrival date & time: 03/04/19  2324     History   Chief Complaint No chief complaint on file.   HPI [name] is a 53 y.o. female.     The history is provided by the EMS personnel. The history is limited by the condition of the patient.  Drug Overdose This is a new problem. Episode onset: unknown. The problem occurs constantly. The problem has not changed since onset.Nothing aggravates the symptoms. Nothing relieves the symptoms. She has tried nothing for the symptoms. The treatment provided no relief.  Reported overdose on unknown dosage of xanax with reported intent to kill herself.    Past Medical History:  Diagnosis Date  . Anemia   . Anxiety   . Asthmatic bronchitis    normal PFT/ seen by pulmonary no evidence of COPD  . Atypical chest pain 10/05/2017  . Chest pain on respiration 03/25/2014  . Chronic bronchitis ([place])   . Chronic lower back pain   . Chronic respiratory failure ([place]) 10/16/2011   Newly 02 dep 24/7 p discharge from [facility] 01/2013  - 03/13/2014  Walked RA  2 laps @ 185 ft each stopped due to  Sob/ aching in legs, thirsty/ no desat @ slow pace   . Chronic respiratory failure with hypoxia (HCC)    On 2-3 L of oxygen at home  . Cigarette smoker 12/02/2010   Followed in Pulmonary clinic/ [facility]/ [place]   - Limits of effective care reviewed 12/22/2011    . COPD (chronic obstructive pulmonary disease) ([place])   . COPD with chronic bronchitis ([place]) 10/01/2017  . Daily headache   . Depression   . Diabetic peripheral neuropathy ([place])   . DM (diabetes mellitus) type II controlled, neurological manifestation ([place]) 12/02/2010  . Gastric erosions    EGD 08/2010.  [name] GERD (gastroesophageal reflux disease)   . H/O drug abuse ([place]) 11/12/2017   -- scanned document from outside source: [facility] in [place] which showed UDS positive  for Adderall /amphetamine usage as well as positive urine for THC.  This test result was collected 08/11/2016 and reported 10/07/2016. - lso review of the chart shows another positive amphetamine, THC and METH in the urine back on 10/18/2015 under "care everywhere". ---So   . Heavy menses   . High cholesterol   . History of blood transfusion    "related to low HgB" (10/05/2017)  . History of hiatal hernia   . HTN (hypertension)   . Hyperlipidemia associated with type 2 diabetes mellitus ([place]) 10/18/2015  . Hypertension associated with diabetes ([place]) 12/02/2010   D/c acei 12/22/2011 due to psuedowheeze and narcotic dependent cough> ? Improved - [id number] started bystolic in place of cozar due to cough    . Increased urinary protein excretion   . Internal hemorrhoids    Colonoscopy 5/12.  . Iron deficiency anemia 10/01/2017  . Mixed diabetic hyperlipidemia associated with type 2 diabetes mellitus ([place]) 10/01/2017  . On home oxygen therapy    "5L at night" (10/05/2017)  . OSA on CPAP   . Osteoarthritis    "back" (10/05/2017)  . Oxygen dependent 10/16/2011  . Pneumonia    "lots of times" (10/05/2017)  . Poorly controlled diabetes mellitus ([place]) 11/12/2017  . Pulmonary infiltrates 12/22/2011   Followed in Pulmonary clinic/ [facility]/ [place]    - See CT Chest 05/05/11   . Shortness of breath   .  Tachycardia    never had test done since no insurance  . Tobacco use disorder-current smoker greater than 40-pack-year history- since age 17 1 ppd 10/01/2017  . Type II diabetes mellitus [facility])     Patient Active Problem List   Diagnosis Date Noted  . Generalized muscle ache 09/24/2018  . Personal history of noncompliance with medical treatment, presenting hazards to health 09/24/2018  . SOB (shortness of breath) 07/09/2018  . Fever, low grade 07/09/2018  . Cough in adult 07/09/2018  . Diabetes mellitus ([place]) 03/11/2018  . Chronic right ear pain 03/11/2018  . Chronic sinusitis 03/11/2018  . H/O  drug abuse ([place]) 11/12/2017  . Positive for macroalbuminuria 11/12/2017  . h/o freq Vaginal yeast infections 11/12/2017  . Poorly controlled diabetes mellitus ([place]) 11/12/2017  . Atypical chest pain 10/05/2017  . COPD with chronic bronchitis ([place]) 10/01/2017  . Mixed diabetic hyperlipidemia associated with type 2 diabetes mellitus ([place]) 10/01/2017  . Tobacco use disorder-current smoker greater than 40-pack-year history- since age 60 1 ppd 10/01/2017  . Tobacco abuse counseling 10/01/2017  . Diabetic peripheral neuropathy ([place]) 10/01/2017  . Iron deficiency anemia 10/01/2017  . Adult attention deficit hyperactivity disorder 01/14/2016  . Gastric reflux 10/18/2015  . Dyspepsia 09/23/2014  . Major depressive disorder, recurrent, severe without psychotic features ([place])   . MDD (major depressive disorder), recurrent episode, severe ([place]) 09/13/2014  . Nasal congestion 05/03/2014  . Chest pain on respiration 03/25/2014  . Leukocytosis 03/25/2014  . Upper airway cough syndrome 02/28/2014  . Abnormal drug screen 04/11/2013  . OSA on CPAP 04/11/2013  . Perforated ear drum 05/22/2012  . Bell's palsy 01/23/2012  . Carpal tunnel syndrome 01/23/2012  . Carpal tunnel syndrome on both sides 01/11/2012  . Pulmonary infiltrates 12/22/2011  . Chronic respiratory failure ([place]) 10/16/2011  . Oxygen dependent 10/16/2011  . Peripheral neuropathy 07/07/2011  . Vitamin D deficiency 07/06/2011  . Asthmatic bronchitis 06/11/2011  . Back pain 06/11/2011  . Morbid obesity ([place]) 05/06/2011  . Depression 05/04/2011  . Gastric erosions 12/05/2010  . Hypertension associated with diabetes ([place]) 12/02/2010  . Diabetes mellitus, type II ([place]) 12/02/2010  . Cigarette smoker 12/02/2010  . COPD with acute exacerbation ([place]) 11/30/2010  . Microcytic anemia 09/08/2010  . GERD (gastroesophageal reflux disease) 09/08/2010  . Esophageal dysphagia 09/08/2010    Past Surgical History:  Procedure Laterality Date  .  CESAREAN SECTION  1988; 1989  . CHOLECYSTECTOMY OPEN  1990  . COLONOSCOPY  09/16/2010   [id number] colon/small internal hemorrhoids  . ESOPHAGOGASTRODUODENOSCOPY  09/16/2010   SLF: normal/mild gastritis  . ESOPHAGOGASTRODUODENOSCOPY N/A 10/19/2014   Procedure: ESOPHAGOGASTRODUODENOSCOPY (EGD);  Surgeon: Danie Binder, MD;  Location: [facility] ENDO SUITE;  Service: Endoscopy;  Laterality: N/A;  830  . FRACTURE SURGERY    . HYSTEROSCOPY WITH THERMACHOICE  01/17/2012   Procedure: HYSTEROSCOPY WITH THERMACHOICE;  Surgeon: Florian Buff, MD;  Location: [facility] ORS;  Service: Gynecology;  Laterality: N/A;  total therapy time: 9:13sec  D5W  18 ml in, D5W   34ml out, temperture 87degrees celcious  . KIDNEY SURGERY     as child for blockages  . TONSILLECTOMY    . TUBAL LIGATION  1989  . TYMPANOSTOMY TUBE PLACEMENT Bilateral    "several times when I was a child"  . uterine ablation    . WRIST FRACTURE SURGERY Left 1995     OB History    Gravida  2   Para  2   Term  2  Preterm      AB      Living  2     SAB      TAB      Ectopic      Multiple      Live Births               Home Medications    Prior to Admission medications   Medication Sig Start Date End Date Taking? Authorizing Provider  acetaminophen (TYLENOL) 500 MG tablet Take 1,000 mg by mouth every 6 (six) hours as needed for mild pain or headache.    [provider]  albuterol (PROVENTIL) (2.5 MG/3ML) 0.083% nebulizer solution Take 3 mLs (2.5 mg total) by nebulization every 6 (six) hours as needed for wheezing or shortness of breath. 02/21/19   Malvin Johns, MD  ALPRAZolam Duanne Moron) 0.5 MG tablet Take 1 tablet (0.5 mg total) by mouth daily as needed for anxiety. 01/13/19 01/13/20  Cloria Spring, MD  amphetamine-dextroamphetamine (ADDERALL) 30 MG tablet Take 1 tablet by mouth 2 (two) times daily. 01/13/19 01/13/20  Cloria Spring, MD  aspirin EC 81 MG tablet Take 1 tablet (81 mg total) by mouth daily. 12/07/17   Dunn,  Nedra Hai, PA-C  atorvastatin (LIPITOR) 80 MG tablet Take 1 tablet (80 mg total) by mouth daily. 12/07/17 03/12/19  Dunn, Nedra Hai, PA-C  BD PEN NEEDLE NANO U/F 32G X 4 MM MISC USE AS DIRECTED WITH VICTOZA 09/10/18   Opalski, Neoma Laming, DO  canagliflozin (INVOKANA) 300 MG TABS tablet Take 1 tablet (300 mg total) by mouth daily before breakfast. 12/11/18   Cirigliano, Garvin Fila, DO  dicyclomine (BENTYL) 10 MG capsule Take 1 capsule (10 mg total) by mouth every 8 (eight) hours as needed for spasms. Patient not taking: Reported on 02/21/2019 12/23/18   Armbruster, Carlota Raspberry, MD  doxycycline (VIBRAMYCIN) 100 MG capsule Take 1 capsule (100 mg total) by mouth 2 (two) times daily. One po bid x 7 days 02/21/19   Malvin Johns, MD  DULoxetine (CYMBALTA) 60 MG capsule TAKE 1 CAPSULE(60 MG) BY MOUTH AT BEDTIME 01/13/19   Cloria Spring, MD  fluticasone [facility]) 50 MCG/ACT nasal spray USE 1 SPRAY IN EACH NOSTRIL TWICE DAILY AFTER SINUS RINSES 12/11/18   Cirigliano, Mary K, DO  Fluticasone-Salmeterol (ADVAIR DISKUS) 250-50 MCG/DOSE AEPB Inhale 1 puff into the lungs 2 (two) times daily. Flush mouth after each use- NO RF's NEEDS OV 11/06/18   Opalski, Deborah, DO  ibuprofen (ADVIL) 800 MG tablet Take 1 tablet (800 mg total) by mouth every 8 (eight) hours as needed. 12/03/18   [name], MD  isosorbide mononitrate (IMDUR) 30 MG 24 hr tablet TAKE 1 TABLET BY MOUTH EVERY DAY 02/07/19   Dunn, Dayna N, PA-C  liraglutide (VICTOZA) 18 MG/3ML SOPN INJECT 0.3 MLS (1.8 MG TOTAL) INTO THE SKIN DAILY. 12/11/18   Cirigliano, Garvin Fila, DO  losartan (COZAAR) 25 MG tablet Take 1 tablet (25 mg total) by mouth daily. 12/11/18   Cirigliano, Garvin Fila, DO  meclizine (ANTIVERT) 25 MG tablet Take 1 tablet (25 mg total) by mouth 3 (three) times daily as needed for dizziness or nausea. Patient not taking: Reported on 02/21/2019 12/07/17   Mellody Dance, DO  pantoprazole (PROTONIX) 40 MG tablet Take 1 tablet (40 mg total) by mouth daily. 30 minutes  before a meal. 11/21/18 11/16/19  Cirigliano, Garvin Fila, DO  predniSONE (DELTASONE) 20 MG tablet 2 tabs po daily x 4 days 02/21/19   Malvin Johns,  MD  pregabalin (LYRICA) 150 MG capsule Take 1 capsule (150 mg total) by mouth 2 (two) times daily. No ReFills 11/21/18   CiriglianoGarvin Fila, DO  Vitamin D, Ergocalciferol, (DRISDOL) 50000 units CAPS capsule Take 1 capsule (50,000 Units total) by mouth every 7 (seven) days. Patient not taking: Reported on 02/21/2019 11/12/17   Mellody Dance, DO    Family History Family History  Problem Relation Age of Onset  . Heart attack Father 44       deceased, etoh use  . Heart disease Father   . Alcohol abuse Father   . Depression Father   . Heart failure Father   . Heart attack Mother 19       deceased  . Diabetes Mother   . Breast cancer Mother   . Heart failure Mother        oxygen dependence, nonsmoker  . Heart disease Mother   . Depression Mother   . Cancer Mother   . Liver disease Maternal Aunt 10       died while on liver transplant list  . Heart attack Maternal Grandmother        premature CAD  . Ulcers Sister   . Hypertension Sister   . Heart failure Sister   . Colon cancer Neg Hx     Social History Social History   Tobacco Use  . Smoking status: Current Every Day Smoker    Packs/day: 1.00    Years: 39.00    Pack years: 39.00    Types: Cigarettes  . Smokeless tobacco: Never Used  Substance Use Topics  . Alcohol use: Yes    Comment: 10/05/2017 "5 - 8 shots tequilia//month"  . Drug use: Yes    Types: Marijuana    Comment: 10/05/2017 "a few times/wk"     Allergies   Codeine, Metformin and related, Wellbutrin [bupropion hcl], and Acyclovir and related   Review of Systems Review of Systems  Unable to perform ROS: Acuity of condition  Constitutional: Negative for fever.  Psychiatric/Behavioral: Positive for suicidal ideas.     Physical Exam Updated Vital Signs BP 125/68   Pulse (!) 102   Temp 98.2 F (36.8 C)    Resp 16   Ht 5' 4.5" (1.638 m)   Wt 119.7 kg   SpO2 100%   BMI 44.62 kg/m   Physical Exam Vitals signs and nursing note reviewed.  Constitutional:      Appearance: She is obese.  HENT:     Head: Normocephalic and atraumatic.     Nose: Nose normal.     Mouth/Throat:     Mouth: Mucous membranes are moist.     Pharynx: Oropharynx is clear.  Eyes:     Comments: Pinpoint pupils  Neck:     Musculoskeletal: Normal range of motion and neck supple.  Cardiovascular:     Rate and Rhythm: Normal rate and regular rhythm.     Pulses: Normal pulses.     Heart sounds: Normal heart sounds.  Pulmonary:     Effort: Bradypnea present.     Breath sounds: Decreased breath sounds present. No rales.  Abdominal:     General: Abdomen is flat. Bowel sounds are normal.     Tenderness: There is no abdominal tenderness. There is no guarding.  Musculoskeletal: Normal range of motion.  Skin:    General: Skin is warm and dry.     Capillary Refill: Capillary refill takes less than 2 seconds.  Neurological:     GCS:  GCS eye subscore is 2. GCS verbal subscore is 1. GCS motor subscore is 4.     Deep Tendon Reflexes: Reflexes normal.  Psychiatric:     Comments: Unable       ED Treatments / Results  Labs (all labs ordered are listed, but only abnormal results are displayed) Results for orders placed or performed during the hospital encounter of 03/04/19  Comprehensive metabolic panel  Result Value Ref Range   Sodium 141 135 - 145 mmol/L   Potassium 3.6 3.5 - 5.1 mmol/L   Chloride 103 98 - 111 mmol/L   CO2 29 22 - 32 mmol/L   Glucose, Bld 166 (H) 70 - 99 mg/dL   BUN 8 6 - 20 mg/dL   Creatinine, Ser 0.59 0.44 - 1.00 mg/dL   Calcium 9.1 8.9 - 10.3 mg/dL   Total Protein 7.8 6.5 - 8.1 g/dL   Albumin 4.1 3.5 - 5.0 g/dL   AST 18 15 - 41 U/L   ALT 22 0 - 44 U/L   Alkaline Phosphatase 68 38 - 126 U/L   Total Bilirubin 0.5 0.3 - 1.2 mg/dL   GFR calc non Af Amer >60 >60 mL/min   GFR calc Af Amer >60  >60 mL/min   Anion gap 9 5 - 15  Ethanol  Result Value Ref Range   Alcohol, Ethyl (B) Q000111Q Q000111Q mg/dL  Salicylate level  Result Value Ref Range   Salicylate Lvl Q000111Q 2.8 - 30.0 mg/dL  Acetaminophen level  Result Value Ref Range   Acetaminophen (Tylenol), Serum <10 (L) 10 - 30 ug/mL  cbc  Result Value Ref Range   WBC 15.3 (H) 4.0 - 10.5 K/uL   RBC 5.97 (H) 3.87 - 5.11 MIL/uL   Hemoglobin 15.3 (H) 12.0 - 15.0 g/dL   HCT 50.7 (H) 36.0 - 46.0 %   MCV 84.9 80.0 - 100.0 fL   MCH 25.6 (L) 26.0 - 34.0 pg   MCHC 30.2 30.0 - 36.0 g/dL   RDW 18.5 (H) 11.5 - 15.5 %   Platelets 250 150 - 400 K/uL   nRBC 0.0 0.0 - 0.2 %  Rapid urine drug screen (hospital performed)  Result Value Ref Range   Opiates NONE DETECTED NONE DETECTED   Cocaine NONE DETECTED NONE DETECTED   Benzodiazepines POSITIVE (A) NONE DETECTED   Amphetamines NONE DETECTED NONE DETECTED   Tetrahydrocannabinol POSITIVE (A) NONE DETECTED   Barbiturates NONE DETECTED NONE DETECTED  Blood gas, arterial  Result Value Ref Range   FIO2 100.00    pH, Arterial 7.315 (L) 7.350 - 7.450   pCO2 arterial 57.1 (H) 32.0 - 48.0 mmHg   pO2, Arterial 72.2 (L) 83.0 - 108.0 mmHg   Bicarbonate 28.4 (H) 20.0 - 28.0 mmol/L   Acid-Base Excess 1.1 0.0 - 2.0 mmol/L   O2 Saturation 93.2 %   Patient temperature 97.7   Pregnancy, urine  Result Value Ref Range   Preg Test, Ur NEGATIVE NEGATIVE  CBG monitoring, ED  Result Value Ref Range   Glucose-Capillary 167 (H) 70 - 99 mg/dL   Comment 1 Notify RN    Dg Abd 1 View  Result Date: 03/05/2019 CLINICAL DATA:  Check gastric catheter placement EXAM: ABDOMEN - 1 VIEW COMPARISON:  None. FINDINGS: Gastric catheter is noted within the stomach. No free air is seen. No definitive obstructive changes are noted. IMPRESSION: Gastric catheter within the stomach. Electronically Signed   By: Inez Catalina M.D.   On: 03/05/2019 00:02   Dg  Chest Portable 1 View  Result Date: 03/05/2019 CLINICAL DATA:  Status post  intubation EXAM: PORTABLE CHEST 1 VIEW COMPARISON:  02/21/2019 FINDINGS: Cardiac shadow is mildly enlarged accentuated by the portable technique. Aortic calcifications are noted. Endotracheal tube and gastric catheter are noted in satisfactory position. The endotracheal tube is seen 3 cm above the carina. Minimal left basilar atelectasis is noted. No bony abnormality is seen. IMPRESSION: Tubes and lines as described above. Mild left basilar atelectasis. Electronically Signed   By: Inez Catalina M.D.   On: 03/05/2019 00:06   Dg Chest Port 1 View  Result Date: 02/21/2019 CLINICAL DATA:  current symptoms has been 3 days, with rhinorrhea, nasal congestion, sore throat, cough. Has had chills and body aches. Current smoker - diabetic - COPD - chronic bronchitis EXAM: PORTABLE CHEST 1 VIEW COMPARISON:  01/10/2018.  09/21/2018. FINDINGS: Cardiac silhouette is normal in size. No mediastinal or hilar masses. No evidence of adenopathy. Lungs are hyperexpanded. Lungs are clear. No pleural effusion or pneumothorax. Skeletal structures are intact. IMPRESSION: No active disease. Electronically Signed   By: Lajean Manes M.D.   On: 02/21/2019 20:48    EKG EKG Interpretation  Date/Time:  Tuesday March 04 2019 23:44:45 EST Ventricular Rate:  117 PR Interval:    QRS Duration: 95 QT Interval:  311 QTC Calculation: 434 R Axis:   -34 Text Interpretation: Sinus tachycardia Baseline wander in lead(s) V5 V6 Confirmed by Randal Buba, Teddie Curd ([id number]) on 03/05/2019 12:24:24 AM   Radiology Dg Abd 1 View  Result Date: 03/05/2019 CLINICAL DATA:  Check gastric catheter placement EXAM: ABDOMEN - 1 VIEW COMPARISON:  None. FINDINGS: Gastric catheter is noted within the stomach. No free air is seen. No definitive obstructive changes are noted. IMPRESSION: Gastric catheter within the stomach. Electronically Signed   By: Inez Catalina M.D.   On: 03/05/2019 00:02   Dg Chest Portable 1 View  Result Date: 03/05/2019 CLINICAL DATA:   Status post intubation EXAM: PORTABLE CHEST 1 VIEW COMPARISON:  02/21/2019 FINDINGS: Cardiac shadow is mildly enlarged accentuated by the portable technique. Aortic calcifications are noted. Endotracheal tube and gastric catheter are noted in satisfactory position. The endotracheal tube is seen 3 cm above the carina. Minimal left basilar atelectasis is noted. No bony abnormality is seen. IMPRESSION: Tubes and lines as described above. Mild left basilar atelectasis. Electronically Signed   By: Inez Catalina M.D.   On: 03/05/2019 00:06    Procedures Procedure Name: Intubation Date/Time: 03/05/2019 2:50 AM Performed by: Veatrice Kells, MD Pre-anesthesia Checklist: Patient identified, Patient being monitored, Emergency Drugs available, Timeout performed and Suction available Oxygen Delivery Method: Non-rebreather mask Preoxygenation: Pre-oxygenation with 100% oxygen Induction Type: Rapid sequence Laryngoscope Size: Glidescope and 4 Grade View: Grade II Tube size: 7.5 mm Number of attempts: 1 Airway Equipment and Method: Patient positioned with wedge pillow Placement Confirmation: ETT inserted through vocal cords under direct vision,  CO2 detector and Breath sounds checked- equal and bilateral Secured at: 23 cm Tube secured with: ETT holder Dental Injury: Teeth and Oropharynx as per pre-operative assessment  Difficulty Due To: Difficult Airway- due to reduced neck mobility and Difficult Airway- due to limited oral opening      (including critical care time)  Medications Ordered in ED Medications  propofol (DIPRIVAN) 1000 MG/100ML infusion (10 mcg/kg/min  119.8 kg Intravenous New Bag/Given 03/05/19 0019)  sterile water (preservative free) injection (has no administration in time range)  vecuronium (NORCURON) injection 10 mg (10 mg Intravenous Given 03/05/19 0019)  etomidate (  AMIDATE) injection (20 mg Intravenous Given 03/04/19 2329)  rocuronium [facility]) injection (80 mg Intravenous Given  03/04/19 2329)     Initial Impression / Assessment and Plan / ED Course   MDM Reviewed: previous chart, nursing note and vitals Reviewed previous: labs Interpretation: labs, ECG and x-ray (NACPD by me on cxr positive UDs for benzos and THC) Total time providing critical care: 75-105 minutes. This excludes time spent performing separately reportable procedures and services. Consults: critical care  CRITICAL CARE Performed by: Pastor Sgro K Dorothie Wah-Rasch Total critical care time: 75 minutes Critical care time was exclusive of separately billable procedures and treating other patients. Critical care was necessary to treat or prevent imminent or life-threatening deterioration. Critical care was time spent personally by me on the following activities: development of treatment plan with patient and/or surrogate as well as nursing, discussions with consultants, evaluation of patient's response to treatment, examination of patient, obtaining history from patient or surrogate, ordering and performing treatments and interventions, ordering and review of laboratory studies, ordering and review of radiographic studies, pulse oximetry and re-evaluation of patient's condition.  Final Clinical Impressions(s) / ED Diagnoses   Final diagnoses:  Drug overdose, undetermined intent, initial encounter    Admit to ICU will need admission to psychiatry once medically stable.     Archana Eckman, MD 03/05/19 [phone]

## 2019-03-05 NOTE — Procedures (Signed)
Extubation Procedure Note  Patient Details:   Name: [name] DOB: 09-21-1965 MRN: [id number]   Airway Documentation:    Vent end date: (S) 03/05/19 Vent end time: (S) 1304   Evaluation  O2 sats: stable throughout Complications: No apparent complications Patient did tolerate procedure well. Bilateral Breath Sounds: Clear, Diminished   Yes  Baldwin [place] Nannette 03/05/2019, 1:05 PM   Negative cuff leak pre extubation- NP aware. Placed on 4 lpm post extubation- PT utilizes 3-5 lpm at home per chart.

## 2019-03-05 NOTE — Progress Notes (Signed)
eLink Physician-Brief Progress Note Patient Name: [name] DOB: 1965-04-27 MRN: [id number]   Date of Service  03/05/2019  HPI/Events of Note  Pt wears CPAP mask at night at home and is requesting same tonight.  eICU Interventions  CPAP of 10 cm H20 ordered.        Kerry Kass [place] 03/05/2019, 11:44 PM

## 2019-03-05 NOTE — Progress Notes (Signed)
ABG results on PRVC, Vt-440, RR-16, FiO2-100.00, PEEP-5.0  Results for [facility], [name] "JAKILA WENZELL" (MRN [id number]) as of 03/05/2019 00:34  Ref. Range 03/05/2019 00:20  FIO2 Unknown 100.00  pH, Arterial Latest Ref Range: 7.350 - 7.450  7.315 (L)  pCO2 arterial Latest Ref Range: 32.0 - 48.0 mmHg 57.1 (H)  pO2, Arterial Latest Ref Range: 83.0 - 108.0 mmHg 72.2 (L)  Acid-Base Excess Latest Ref Range: 0.0 - 2.0 mmol/L 1.1  Bicarbonate Latest Ref Range: 20.0 - 28.0 mmol/L 28.4 (H)  O2 Saturation Latest Units: % 93.2  Patient temperature Unknown 97.7

## 2019-03-05 NOTE — Progress Notes (Signed)
Awake Follows commands.  F/Vt ratio excellent w/out evidence of accessory use.   ->extubated w/ difficult airway cart ready as no cuff leak noted.   Post-extubation  Speech strong  No stridor.  Breathing non-labored.   Plan Cont pulse ox  Wean oxygen NPO-->adv diet   Safety sitter at bedside.   Erick Colace ACNP-BC [place] Pager # [phone] OR # [phone] if no answer

## 2019-03-05 NOTE — Consult Note (Addendum)
[name], MRN:  [id number], DOB:  Jan 22, 1966, LOS: 0 ADMISSION DATE:  03/04/2019, CONSULTATION DATE:  03/05/19 REFERRING MD:  Randal Buba, CHIEF COMPLAINT:  Ativan overdose  Brief History   53 year old woman with a history of DMII, HLD, HTN, COPD on 2Lnc at home, admitted after ingestion of 15-20 xanax pills with intention to harm herself, saying she "did not want to live anymore" per notes.  Was alert when EMS initially evaluated, then lost consciousness.       History of present illness   As above  Past Medical History  COPD on 2-3L o2 at home (40 pack year hx) Depression/anxiety DMII GERD Meth use  HTN HLD OSA +etoh use (unk current amount)  Meds:  Xanax, albuterol, adderall, lipitor, canagliflozin 300daily, bentyl prn , doxycycline (10/30-11/5), prednisone taper 10/30-11/5, flonase, duloxetine, advair, cozaar, antivert, imdur, liraglutide, ibuprofen, protonix, lyrica, vit D  Significant Hospital Events   Intubated in ED for airway protection  Consults:    Procedures:  11/11 Intubation  Significant Diagnostic Tests:  CXR: Cardiac shadow is mildly enlarged accentuated by the portable technique. Aortic calcifications are noted. Endotracheal tube and gastric catheter are noted in satisfactory position. The endotracheal tube is seen 3 cm above the carina. Minimal left basilar atelectasis is noted.  Micro Data:    Antimicrobials:    Interim history/subjective:    Objective   Blood pressure (!) 144/89, pulse 99, temperature 98.8 F (37.1 C), resp. rate 18, height 5' 4.5" (1.638 m), weight 119.7 kg, SpO2 100 %.    Vent Mode: PRVC FiO2 (%):  [50 %-100 %] 100 % Set Rate:  [16 bmp-22 bmp] 22 bmp Vt Set:  [440 mL] 440 mL PEEP:  [5 cmH20] 5 cmH20 Plateau Pressure:  [22 cmH20] 22 cmH20   Intake/Output Summary (Last 24 hours) at 03/05/2019 0341 Last data filed at 03/05/2019 0052 Gross per 24 hour  Intake -  Output 500 ml  Net -500 ml    Filed Weights   03/04/19 2327  Weight: 119.7 kg    Examination: General: NAD, arousable, eyes opening but not following commands  HENT: NCAT, conjunctival erythema.  Lungs: CTAB Cardiovascular:RRR no mgr Abdomen: nt, nd, nbs Extremities:  Mild non pitting edema in BLE, no erythema Neuro: drowsy, mild agitation, spontaneously moving all extremities.    Resolved Hospital Problem list     Assessment & Plan:  Acute encephalopathy, alprazolam overdose:  Currently intubated for airway protection.  NG tube in place.  Starting to wake up.   No evidence of other ingestions.  When awake will need to monitor for benzo withdrawal.   NPO anticipating possible extubation later today if more alert.   Psych consult when awake.    Resp acidosis, acute on chronic:  RR increased to 22 after initial abg.   Hx of COPD.  Duonebs ordered. Restart home meds when able (advair).  Titrate FIO2 as tolerates to O2 sat.   Mild WBC elevation: unclear if other evidence of infection. No fever.  Bl cultures pending.   DM2: ISS, po meds held.   Depression/anxiety GERD Meth use history (negative on Utox) HTN - held ARB.   HLD - lipitor.  OSA     Best practice:  Diet: NPO  Pain/Anxiety/Delirium protocol (if indicated): propofol, fentanyl  VAP protocol (if indicated):yes DVT prophylaxis: lovenox GI prophylaxis: ppi Glucose control: iss Mobility: bedrest  Code Status: Full  Family Communication: Disposition: ICU  Labs   CBC: Recent Labs  Lab  03/04/19 2333  WBC 15.3*  HGB 15.3*  HCT 50.7*  MCV 84.9  PLT [id number]    Basic Metabolic Panel: Recent Labs  Lab 03/04/19 2333  NA 141  K 3.6  CL 103  CO2 29  GLUCOSE 166*  BUN 8  CREATININE 0.59  CALCIUM 9.1   GFR: Estimated Creatinine Clearance: 104.6 mL/min (by C-G formula based on SCr of 0.59 mg/dL). Recent Labs  Lab 03/04/19 2333  WBC 15.3*    Liver Function Tests: Recent Labs  Lab 03/04/19 2333  AST 18  ALT 22   ALKPHOS 68  BILITOT 0.5  PROT 7.8  ALBUMIN 4.1   No results for input(s): LIPASE, AMYLASE in the last 168 hours. No results for input(s): AMMONIA in the last 168 hours.  ABG    Component Value Date/Time   PHART 7.315 (L) 03/05/2019 0020   PCO2ART 57.1 (H) 03/05/2019 0020   PO2ART 72.2 (L) 03/05/2019 0020   HCO3 28.4 (H) 03/05/2019 0020   TCO2 27 11/17/2017 1017   ACIDBASEDEF 0.7 03/25/2014 1845   O2SAT 93.2 03/05/2019 0020     Coagulation Profile: No results for input(s): INR, PROTIME in the last 168 hours.  Cardiac Enzymes: No results for input(s): CKTOTAL, CKMB, CKMBINDEX, TROPONINI in the last 168 hours.  HbA1C: Hgb A1C (fingerstick)  Date/Time Value Ref Range Status  10/03/2013 04:55 PM 6.1 (H) <5.7 % Final    Comment:                                                                           According to the ADA Clinical Practice Recommendations for 2011, when HbA1c is used as a screening test:     >=6.5%   Diagnostic of Diabetes Mellitus            (if abnormal result is confirmed)   5.7-6.4%   Increased risk of developing Diabetes Mellitus   References:Diagnosis and Classification of Diabetes Mellitus,Diabetes [id number] 1):S62-S69 and Standards of Medical Care in         Diabetes - 2011,Diabetes [id number] (Suppl 1):S11-S61.     Hgb A1c MFr Bld  Date/Time Value Ref Range Status  09/24/2018 12:00 PM 7.7 (H) 4.8 - 5.6 % Final    Comment:             Prediabetes: 5.7 - 6.4          Diabetes: >6.4          Glycemic control for adults with diabetes: <7.0   02/12/2018 08:30 AM 7.8 (H) 4.8 - 5.6 % Final    Comment:             Prediabetes: 5.7 - 6.4          Diabetes: >6.4          Glycemic control for adults with diabetes: <7.0     CBG: Recent Labs  Lab 03/04/19 2342  GLUCAP 167*    Review of Systems:   Unable to assess, intubated.   Past Medical History  She,  has a past medical history of Anemia, Anxiety, Asthmatic bronchitis,  Atypical chest pain (10/05/2017), Chest pain on respiration (03/25/2014), Chronic bronchitis ([place]), Chronic lower back pain, Chronic respiratory failure ([place]) (10/16/2011),  Chronic respiratory failure with hypoxia (HCC), Cigarette smoker (12/02/2010), COPD (chronic obstructive pulmonary disease) ([place]), COPD with chronic bronchitis (HCC) (10/01/2017), Daily headache, Depression, Diabetic peripheral neuropathy ([place]), DM (diabetes mellitus) type II controlled, neurological manifestation ([place]) (12/02/2010), Gastric erosions, GERD (gastroesophageal reflux disease), H/O drug abuse ([place]) (11/12/2017), Heavy menses, High cholesterol, History of blood transfusion, History of hiatal hernia, HTN (hypertension), Hyperlipidemia associated with type 2 diabetes mellitus ([place]) (10/18/2015), Hypertension associated with diabetes ([place]) (12/02/2010), Increased urinary protein excretion, Internal hemorrhoids, Iron deficiency anemia (10/01/2017), Mixed diabetic hyperlipidemia associated with type 2 diabetes mellitus ([place]) (10/01/2017), On home oxygen therapy, OSA on CPAP, Osteoarthritis, Oxygen dependent (10/16/2011), Pneumonia, Poorly controlled diabetes mellitus ([place]) (11/12/2017), Pulmonary infiltrates (12/22/2011), Shortness of breath, Tachycardia, Tobacco use disorder-current smoker greater than 40-pack-year history- since age 65 1 ppd (10/01/2017), and Type II diabetes mellitus ([place]).   Surgical History    Past Surgical History:  Procedure Laterality Date  . CESAREAN SECTION  1988; 1989  . CHOLECYSTECTOMY OPEN  1990  . COLONOSCOPY  09/16/2010   [id number] colon/small internal hemorrhoids  . ESOPHAGOGASTRODUODENOSCOPY  09/16/2010   SLF: normal/mild gastritis  . ESOPHAGOGASTRODUODENOSCOPY N/A 10/19/2014   Procedure: ESOPHAGOGASTRODUODENOSCOPY (EGD);  Surgeon: Danie Binder, MD;  Location: [facility];  Service: Endoscopy;  Laterality: N/A;  830  . FRACTURE SURGERY    . HYSTEROSCOPY WITH THERMACHOICE  01/17/2012   Procedure:  HYSTEROSCOPY WITH THERMACHOICE;  Surgeon: Florian Buff, MD;  Location: [facility]S;  Service: Gynecology;  Laterality: N/A;  total therapy time: 9:13sec  D5W  18 ml in, D5W   39ml out, temperture 87degrees celcious  . KIDNEY SURGERY     as child for blockages  . TONSILLECTOMY    . TUBAL LIGATION  1989  . TYMPANOSTOMY TUBE PLACEMENT Bilateral    "several times when I was a child"  . uterine ablation    . WRIST FRACTURE SURGERY Left 1995     Social History   reports that she has been smoking cigarettes. She has a 39.00 pack-year smoking history. She has never used smokeless tobacco. She reports current alcohol use. She reports current drug use. Drug: Marijuana.   Family History   Her family history includes Alcohol abuse in her father; Breast cancer in her mother; Cancer in her mother; Depression in her father and mother; Diabetes in her mother; Heart attack in her maternal grandmother; Heart attack (age of onset: 74) in her father; Heart attack (age of onset: 65) in her mother; Heart disease in her father and mother; Heart failure in her father, mother, and sister; Hypertension in her sister; Liver disease (age of onset: 2) in her maternal aunt; Ulcers in her sister. There is no history of Colon cancer.   Allergies Allergies  Allergen Reactions  . Codeine Itching and Nausea Only  . Metformin And Related Diarrhea and Other (See Comments)    Stomach pain/nausea  . Wellbutrin [Bupropion Hcl] Hives  . Acyclovir And Related Rash     Home Medications  Prior to Admission medications   Medication Sig Start Date End Date Taking? Authorizing Provider  acetaminophen (TYLENOL) 500 MG tablet Take 1,000 mg by mouth every 6 (six) hours as needed for mild pain or headache.    [provider]  albuterol (PROVENTIL) (2.5 MG/3ML) 0.083% nebulizer solution Take 3 mLs (2.5 mg total) by nebulization every 6 (six) hours as needed for wheezing or shortness of breath. 02/21/19   Malvin Johns, MD   ALPRAZolam Duanne Moron) 0.5 MG tablet Take 1  tablet (0.5 mg total) by mouth daily as needed for anxiety. 01/13/19 01/13/20  Cloria Spring, MD  amphetamine-dextroamphetamine (ADDERALL) 30 MG tablet Take 1 tablet by mouth 2 (two) times daily. 01/13/19 01/13/20  Cloria Spring, MD  aspirin EC 81 MG tablet Take 1 tablet (81 mg total) by mouth daily. 12/07/17   Dunn, Nedra Hai, PA-C  atorvastatin (LIPITOR) 80 MG tablet Take 1 tablet (80 mg total) by mouth daily. 12/07/17 03/12/19  Dunn, Nedra Hai, PA-C  BD PEN NEEDLE NANO U/F 32G X 4 MM MISC USE AS DIRECTED WITH VICTOZA 09/10/18   Opalski, Neoma Laming, DO  canagliflozin (INVOKANA) 300 MG TABS tablet Take 1 tablet (300 mg total) by mouth daily before breakfast. 12/11/18   Cirigliano, Garvin Fila, DO  dicyclomine (BENTYL) 10 MG capsule Take 1 capsule (10 mg total) by mouth every 8 (eight) hours as needed for spasms. Patient not taking: Reported on 02/21/2019 12/23/18   Armbruster, Carlota Raspberry, MD  doxycycline (VIBRAMYCIN) 100 MG capsule Take 1 capsule (100 mg total) by mouth 2 (two) times daily. One po bid x 7 days 02/21/19   Malvin Johns, MD  DULoxetine (CYMBALTA) 60 MG capsule TAKE 1 CAPSULE(60 MG) BY MOUTH AT BEDTIME 01/13/19   Cloria Spring, MD  fluticasone [facility]) 50 MCG/ACT nasal spray USE 1 SPRAY IN EACH NOSTRIL TWICE DAILY AFTER SINUS RINSES 12/11/18   Cirigliano, Mary K, DO  Fluticasone-Salmeterol (ADVAIR DISKUS) 250-50 MCG/DOSE AEPB Inhale 1 puff into the lungs 2 (two) times daily. Flush mouth after each use- NO RF's NEEDS OV 11/06/18   Opalski, Deborah, DO  ibuprofen (ADVIL) 800 MG tablet Take 1 tablet (800 mg total) by mouth every 8 (eight) hours as needed. 12/03/18   [name], MD  isosorbide mononitrate (IMDUR) 30 MG 24 hr tablet TAKE 1 TABLET BY MOUTH EVERY DAY 02/07/19   Dunn, Dayna N, PA-C  liraglutide (VICTOZA) 18 MG/3ML SOPN INJECT 0.3 MLS (1.8 MG TOTAL) INTO THE SKIN DAILY. 12/11/18   Cirigliano, Garvin Fila, DO  losartan (COZAAR) 25 MG tablet Take 1 tablet  (25 mg total) by mouth daily. 12/11/18   Cirigliano, Garvin Fila, DO  meclizine (ANTIVERT) 25 MG tablet Take 1 tablet (25 mg total) by mouth 3 (three) times daily as needed for dizziness or nausea. Patient not taking: Reported on 02/21/2019 12/07/17   Mellody Dance, DO  pantoprazole (PROTONIX) 40 MG tablet Take 1 tablet (40 mg total) by mouth daily. 30 minutes before a meal. 11/21/18 11/16/19  Cirigliano, Garvin Fila, DO  predniSONE (DELTASONE) 20 MG tablet 2 tabs po daily x 4 days 02/21/19   Malvin Johns, MD  pregabalin (LYRICA) 150 MG capsule Take 1 capsule (150 mg total) by mouth 2 (two) times daily. No ReFills 11/21/18   CiriglianoGarvin Fila, DO  Vitamin D, Ergocalciferol, (DRISDOL) 50000 units CAPS capsule Take 1 capsule (50,000 Units total) by mouth every 7 (seven) days. Patient not taking: Reported on 02/21/2019 11/12/17   Mellody Dance, DO     Critical care time: 45 minutes

## 2019-03-05 NOTE — Progress Notes (Signed)
Spoke to patient in great depth about events leading up to her taking the 20 Xanax. She reports that she I sunder a lot os stress at home and just wanted to sleep for a few days. She has no plan but has thoughts of wanting to "just die".

## 2019-03-05 NOTE — Progress Notes (Signed)
[facility] Progress Note Patient Name: [name] DOB: December 03, 1965 MRN: [id number]   Date of Service  03/05/2019  HPI/Events of Note  Pt was admitted with intentional overdose of Xanax due to depression. She is intubated and on a ventilator for acute respiratory failure induced by Xanax overdose.  eICU Interventions  New patient evaluation completed.        Kerry Kass Emmersen Garraway 03/05/2019, 6:10 AM

## 2019-03-05 NOTE — Progress Notes (Signed)
[name], MRN:  [id number], DOB:  1965/06/18, LOS: 0 ADMISSION DATE:  03/04/2019, CONSULTATION DATE:  03/04/2019  CHIEF COMPLAINT:  Intentional drug overdose  Brief History   Patient is a 53 year old woman with a history of COPD on home oxygen, obstructive sleep apnea on CPAP, type 2 diabetes, obesity, and anxiety who presented after intentional overdose of 15 to 20 pills of Xanax dose not specified.  She was brought in by EMS on the evening of 11/10, was initially ambulating and then quickly became nonresponsive.  She was intubated in the ED.  Consults:  None  Procedures:  11/10>> intubated in the ED  Significant Diagnostic Tests:  11/10 Covid PCR pending 11/11 rapid Covid test sent and pending  Micro Data:  Blood culture pending, Covid test pending  Antimicrobials:  None  Interim history/subjective:  Overnight comfortable on propofol and fentanyl.  Upon weaning these down this morning, she is awake and following commands   Objective   Blood pressure 112/75, pulse 85, temperature 98.4 F (36.9 C), temperature source Axillary, resp. rate (!) 22, height 5' 4.5" (1.638 m), weight 121.7 kg, SpO2 100 %.    Vent Mode: PRVC FiO2 (%):  [50 %-100 %] 70 % Set Rate:  [16 bmp-22 bmp] 22 bmp Vt Set:  [440 mL] 440 mL PEEP:  [5 cmH20] 5 cmH20 Plateau Pressure:  [19 cmH20-22 cmH20] 19 cmH20   Intake/Output Summary (Last 24 hours) at 03/05/2019 0957 Last data filed at 03/05/2019 [id number] Gross per 24 hour  Intake 384.44 ml  Output 700 ml  Net -315.56 ml   Filed Weights   03/04/19 2327 03/05/19 0600  Weight: 119.7 kg 121.7 kg    Examination: General: Intubated, sedated, obese HENT: Cushingoid appearance Lungs: Diminished, no wheezes or crackles Cardiovascular: Regular rate and rhythm, no murmurs rubs or gallop Abdomen: Distended, soft, normal bowel sounds Extremities: Nonedematous Neuro: Mentally awakens to verbal stimuli, is able to follow commands   MSK: No rash Lines: Foley, no central lines  Assessment & Plan:  [name] is a 53 year old woman who presents with intentional drug overdose.  #1 acute encephalopathy secondary to: #2 intentional benzodiazepine ingestion #3 acute hypoxemic respiratory failure: Intubated for airway protection #4 type 2 diabetes mellitus, not well controlled.  A1c is over 8%   Plan: We will hold sedation and attempt to extubate today.   She will need one-to-one care due to suicide attempt.  She will need a psych consult when medically appropriate. We will increase her sliding scale insulin coverage due to hyperglycemia.  We are holding her oral hypoglycemic agents Continue aspirin and statin.    Best practice:  Diet: N.p.o. she has an OG tube in place Pain/Anxiety/Delirium protocol (if indicated): Limit benzodiazepine VAP protocol (if indicated): Ordered DVT prophylaxis: Lovenox GI prophylaxis: PPI Glucose control: Not well controlled, will increase sliding scale insulin Foley Foley in place due to critical illness Mobility: Bedrest due to patient condition Code Status: Full code Family Communication: Husband has been updated this morning Disposition: We will continue to require ICU care while intubated.  Labs   CBC: Recent Labs  Lab 03/04/19 2333 03/05/19 0440  WBC 15.3* 21.8*  NEUTROABS  --  17.4*  HGB 15.3* 14.1  HCT 50.7* 47.6*  MCV 84.9 85.9  PLT 250 [id number]    Basic Metabolic Panel: Recent Labs  Lab 03/04/19 2333 03/05/19 0440  NA 141 138  K 3.6 3.7  CL 103 101  CO2 29 28  GLUCOSE 166*  210*  BUN 8 9  CREATININE 0.59 0.73  CALCIUM 9.1 8.9  MG  --  2.0  PHOS  --  3.6   GFR: Estimated Creatinine Clearance: 105.5 mL/min (by C-G formula based on SCr of 0.73 mg/dL). Recent Labs  Lab 03/04/19 2333 03/05/19 0440  WBC 15.3* 21.8*    Liver Function Tests: Recent Labs  Lab 03/04/19 2333 03/05/19 0440  AST 18 20  ALT 22 23  ALKPHOS 68 61  BILITOT 0.5 0.6  PROT  7.8 6.9  ALBUMIN 4.1 3.5   No results for input(s): LIPASE, AMYLASE in the last 168 hours. No results for input(s): AMMONIA in the last 168 hours.  ABG    Component Value Date/Time   PHART 7.315 (L) 03/05/2019 0020   PCO2ART 57.1 (H) 03/05/2019 0020   PO2ART 72.2 (L) 03/05/2019 0020   HCO3 28.4 (H) 03/05/2019 0020   TCO2 27 11/17/2017 1017   ACIDBASEDEF 0.7 03/25/2014 1845   O2SAT 93.2 03/05/2019 0020     Coagulation Profile: Recent Labs  Lab 03/05/19 0440  INR 1.0    Cardiac Enzymes: No results for input(s): CKTOTAL, CKMB, CKMBINDEX, TROPONINI in the last 168 hours.  HbA1C: Hgb A1C (fingerstick)  Date/Time Value Ref Range Status  10/03/2013 04:55 PM 6.1 (H) <5.7 % Final    Comment:                                                                           According to the ADA Clinical Practice Recommendations for 2011, when HbA1c is used as a screening test:     >=6.5%   Diagnostic of Diabetes Mellitus            (if abnormal result is confirmed)   5.7-6.4%   Increased risk of developing Diabetes Mellitus   References:Diagnosis and Classification of Diabetes Mellitus,Diabetes [id number] 1):S62-S69 and Standards of Medical Care in         Diabetes - 2011,Diabetes [id number] (Suppl 1):S11-S61.     Hgb A1c MFr Bld  Date/Time Value Ref Range Status  03/05/2019 04:40 AM 8.2 (H) 4.8 - 5.6 % Final    Comment:    (NOTE) Pre diabetes:          5.7%-6.4% Diabetes:              >6.4% Glycemic control for   <7.0% adults with diabetes   09/24/2018 12:00 PM 7.7 (H) 4.8 - 5.6 % Final    Comment:             Prediabetes: 5.7 - 6.4          Diabetes: >6.4          Glycemic control for adults with diabetes: <7.0     CBG: Recent Labs  Lab 03/04/19 2342 03/05/19 0439  GLUCAP 167* 193*    Critical care time:   The patient is critically ill with multiple organ systems failure and requires high complexity decision making for assessment and support,  frequent evaluation and titration of therapies, application of advanced monitoring technologies and extensive interpretation of multiple databases.   Critical Care Time devoted to patient care services described in this note is 41 minutes. This time reflects the time of  my personal involvement. This critical care time does not reflect separately billable procedures or procedure time, teaching time or supervisory time of PA/NP/Med student/Med Resident etc but could involve care discussion time.  [facility] 03/05/2019 9:57 AM  Pager: [phone] After hours pager: [phone]

## 2019-03-06 ENCOUNTER — Other Ambulatory Visit: Payer: Self-pay

## 2019-03-06 DIAGNOSIS — E119 Type 2 diabetes mellitus without complications: Secondary | ICD-10-CM

## 2019-03-06 DIAGNOSIS — I10 Essential (primary) hypertension: Secondary | ICD-10-CM

## 2019-03-06 DIAGNOSIS — T1491XA Suicide attempt, initial encounter: Secondary | ICD-10-CM

## 2019-03-06 LAB — GLUCOSE, CAPILLARY
Glucose-Capillary: 131 mg/dL — ABNORMAL HIGH (ref 70–99)
Glucose-Capillary: 165 mg/dL — ABNORMAL HIGH (ref 70–99)
Glucose-Capillary: 164 mg/dL — ABNORMAL HIGH (ref 70–99)
Glucose-Capillary: 268 mg/dL — ABNORMAL HIGH (ref 70–99)
Glucose-Capillary: 178 mg/dL — ABNORMAL HIGH (ref 70–99)
Glucose-Capillary: 187 mg/dL — ABNORMAL HIGH (ref 70–99)
Glucose-Capillary: 224 mg/dL — ABNORMAL HIGH (ref 70–99)

## 2019-03-06 LAB — CBC
HCT: 47.1 % — ABNORMAL HIGH (ref 36.0–46.0)
Hemoglobin: 13.9 g/dL (ref 12.0–15.0)
MCH: 25.3 pg — ABNORMAL LOW (ref 26.0–34.0)
MCHC: 29.5 g/dL — ABNORMAL LOW (ref 30.0–36.0)
MCV: 85.8 fL (ref 80.0–100.0)
Platelets: 218 10*3/uL (ref 150–400)
RBC: 5.49 MIL/uL — ABNORMAL HIGH (ref 3.87–5.11)
RDW: 17.2 % — ABNORMAL HIGH (ref 11.5–15.5)
WBC: 12.9 10*3/uL — ABNORMAL HIGH (ref 4.0–10.5)
nRBC: 0 % (ref 0.0–0.2)

## 2019-03-06 LAB — BASIC METABOLIC PANEL
Anion gap: 8 (ref 5–15)
BUN: 9 mg/dL (ref 6–20)
CO2: 28 mmol/L (ref 22–32)
Calcium: 8.8 mg/dL — ABNORMAL LOW (ref 8.9–10.3)
Chloride: 103 mmol/L (ref 98–111)
Creatinine, Ser: 0.58 mg/dL (ref 0.44–1.00)
GFR calc Af Amer: >60 mL/min (ref >60)
GFR calc non Af Amer: >60 mL/min (ref >60)
Glucose, Bld: 163 mg/dL — ABNORMAL HIGH (ref 70–99)
Potassium: 3.7 mmol/L (ref 3.5–5.1)
Sodium: 139 mmol/L (ref 135–145)

## 2019-03-06 LAB — MAGNESIUM: Magnesium: 1.9 mg/dL (ref 1.7–2.4)

## 2019-03-06 LAB — TRIGLYCERIDES: Triglycerides: 89 mg/dL (ref <150)

## 2019-03-06 MED ORDER — INSULIN ASPART 100 UNIT/ML ~~LOC~~ SOLN
0.0000 [IU] | Freq: Every day | SUBCUTANEOUS | Status: DC
  Administered 2019-03-06: 2 [IU] via SUBCUTANEOUS

## 2019-03-06 MED ORDER — UMECLIDINIUM BROMIDE 62.5 MCG/INH IN AEPB
1.0000 | INHALATION_SPRAY | Freq: Every day | RESPIRATORY_TRACT | Status: DC
  Administered 2019-03-07: 1 via RESPIRATORY_TRACT
  Filled 2019-03-06: qty 7

## 2019-03-06 MED ORDER — INSULIN ASPART 100 UNIT/ML ~~LOC~~ SOLN
0.0000 [IU] | Freq: Three times a day (TID) | SUBCUTANEOUS | Status: DC
  Administered 2019-03-06 – 2019-03-07 (×4): 3 [IU] via SUBCUTANEOUS

## 2019-03-06 MED ORDER — FLUTICASONE FUROATE-VILANTEROL 200-25 MCG/INH IN AEPB
1.0000 | INHALATION_SPRAY | Freq: Every day | RESPIRATORY_TRACT | Status: DC
  Administered 2019-03-07: 1 via RESPIRATORY_TRACT
  Filled 2019-03-06: qty 28

## 2019-03-06 MED ORDER — CANAGLIFLOZIN 100 MG PO TABS
300.0000 mg | ORAL_TABLET | Freq: Every day | ORAL | Status: DC
  Administered 2019-03-07: 300 mg via ORAL
  Filled 2019-03-06 (×2): qty 3

## 2019-03-06 MED ORDER — LORAZEPAM 2 MG/ML IJ SOLN: 1.0000 mg | Freq: Three times a day (TID) | INTRAMUSCULAR | Status: DC | PRN

## 2019-03-06 MED ORDER — INSULIN ASPART 100 UNIT/ML ~~LOC~~ SOLN: 8.0000 [IU] | Freq: Three times a day (TID) | SUBCUTANEOUS | Status: DC

## 2019-03-06 MED ORDER — LOSARTAN POTASSIUM 50 MG PO TABS
25.0000 mg | ORAL_TABLET | Freq: Every day | ORAL | Status: DC
  Administered 2019-03-06 – 2019-03-07 (×2): 25 mg via ORAL
  Filled 2019-03-06 (×2): qty 1

## 2019-03-06 MED ORDER — PANTOPRAZOLE SODIUM 40 MG PO TBEC
40.0000 mg | DELAYED_RELEASE_TABLET | Freq: Every day | ORAL | Status: DC
  Administered 2019-03-06 – 2019-03-07 (×2): 40 mg via ORAL
  Filled 2019-03-06 (×2): qty 1

## 2019-03-06 MED ORDER — INSULIN GLARGINE 100 UNIT/ML ~~LOC~~ SOLN
24.0000 [IU] | Freq: Every day | SUBCUTANEOUS | Status: DC
  Administered 2019-03-06: 24 [IU] via SUBCUTANEOUS
  Filled 2019-03-06 (×3): qty 0.24

## 2019-03-06 NOTE — Progress Notes (Signed)
Upon completion of admission warning flag triggered due to patient symptom of on going cough.  Pt. Has tested negative for Covid-19 and reports being diagnosed with bronchitis by PCP.  Pt. Is experiencing no other symptoms of covid-19 at this time no further precautions will be placed.

## 2019-03-06 NOTE — Progress Notes (Signed)
Patient to transfer to [id number] report given to receiving nurse, all questions answered.  Pt. VSS with no s/s of distress noted.  Patient stable at transfer. SI sitter traveling with patient.

## 2019-03-06 NOTE — Progress Notes (Addendum)
PROGRESS NOTE    Denisse A [name]-Ocallaghan    Code Status: Full Code  HS:[id number] DOB: [birth year] DOA: 03/04/2019  PCP: Ronnald Nian, DO    Hospital Summary  This is a 53 year old female with past medical history of COPD not on home oxygen, OSA on CPAP, type 2 diabetes, obesity, anxiety and depression with suicidal attempts in the past who presented via EMS on 03/04/2019 for suicide attempt after taking 15-20 Xanax unknown dosage at home.  Patient was alert when EMS initially evaluated but then lost consciousness.  She was intubated in the ED and admitted to the ICU.  Covid negative.  Extubated successfully 11/11 and placed on one-to-one.  Psychiatry consulted.  Patient transferred out of ICU and to [facility] on 11/12.  A & P   Active Problems:   Benzodiazepine overdose   Acute Toxic Encephalopathy secondary to Intentional benzodiazepine overdose in setting of prior suicide attempts intubated 11/10, extubated 11/11 successfully.  On one-to-one for suicide precautions. -Transfer to telemetry floor -Psychiatry consult -Continue one-to-one sitter pending psych consult -Discontinue D5 half-normal with KCl  COPD Follows with [facility]. Currently 96% on room air.  Wheezing on exam.  On Advair previously discontinued and started on Dulera in 2014 -Continue DuoNeb every 4 hours as needed -Start on [employer] while inpatient -Spiriva  OSA on CPAP -CPAP at night  Poorly controlled diabetes HA1C 8.2 -Reduced weight-based insulin dosing: Lantus 24 units nightly with sliding scale daily with meals and nighttime sliding scale -Continue home canagliflozin -Carb modified diet  Anxiety and depression -Hold Xanax, monitor for benzo withdrawal -Continue Cymbalta -Psych consulted  Hypertension stable -Continue home losartan  Chronic leukocytosis -We will get differential with CBC tomorrow  CAD The 10-year ASCVD risk: 9.4%. Follows with Dr. Tamala Julian,  Cardiology -Continue home Lipitor 80 mg -Continue aspirin   DVT prophylaxis: Continue Lovenox Diet: Carb modified Family Communication: No family at bedside Disposition Plan: Transfer to medical telemetry unit from ICU  Consultants  PCCM Psychiatry  Procedures  Intubated 11/10, extubated 11/11  Antibiotics  None      Subjective   Patient seen and examined at bedside no acute distress resting comfortably.  She states that her depression has been relatively well controlled up until the recent COVID-19 pandemic when her stepdaughter's were forced to move into her house with her and her husband.  She does not get along well with her stepdaughter they have been fighting frequently at home.  In addition, due to COVID-19 her children/stepchildren have not been at school and everybody has been stuck at home fighting.  This is caused significant distress.  Just prior to her suicide attempt, patient states that she had a significant fight with her stepdaughter which led her to take a the rest of her Xanax, roughly 15 to 20 pills, and a suicide attempt which brought her to the hospital on 11/10.  She states that she has had suicide attempts in the past but this was several years ago.  Used to see a therapist.  She is very tearful today and feels she failed herself as well as her husband as her birthday was yesterday and his birthday is today feels terrible about ruining these events.  Currently, she denies any chest pain, shortness of breath, nausea, vomiting.  States she did have a loose bowel movement today.  Remaining ROS negative  Objective   Vitals:   03/06/19 0800 03/06/19 1125 03/06/19 1155 03/06/19 1218  BP: 94/68 (!) 91/51  107/63  Pulse: 85 (!) 102 99 95  Resp: 13 (!) 22 18 13   Temp: 97.7 F (36.5 C)  98.5 F (36.9 C)   TempSrc: Oral  Oral   SpO2: 94% 97% 96% 95%  Weight:      Height:        Intake/Output Summary (Last 24 hours) at 03/06/2019 1335 Last data filed at  03/06/2019 1014 Gross per 24 hour  Intake 2001.87 ml  Output 1550 ml  Net 451.87 ml   Filed Weights   03/04/19 2327 03/05/19 0600 03/06/19 0456  Weight: 119.7 kg 121.7 kg 119.1 kg    Examination:  Physical Exam Vitals signs and nursing note reviewed.  Constitutional:      Appearance: She is obese.  HENT:     Head: Normocephalic and atraumatic.     Nose: Nose normal.     Mouth/Throat:     Mouth: Mucous membranes are moist.  Eyes:     Extraocular Movements: Extraocular movements intact.  Neck:     Musculoskeletal: Normal range of motion. No neck rigidity.  Cardiovascular:     Rate and Rhythm: Normal rate and regular rhythm.  Pulmonary:     Effort: Pulmonary effort is normal.     Comments: Diffuse end expiratory wheeze Abdominal:     General: Abdomen is flat.     Palpations: Abdomen is soft.  Musculoskeletal: Normal range of motion.        General: No swelling.  Neurological:     General: No focal deficit present.     Mental Status: She is alert. Mental status is at baseline.  Psychiatric:        Attention and Perception: Attention normal.        Mood and Affect: Mood is depressed. Affect is tearful.        Speech: Speech normal.        Behavior: Behavior is cooperative.     Data Reviewed: I have personally reviewed following labs and imaging studies  CBC: Recent Labs  Lab 03/04/19 2333 03/05/19 0440 03/06/19 0802  WBC 15.3* 21.8* 12.9*  NEUTROABS  --  17.4*  --   HGB 15.3* 14.1 13.9  HCT 50.7* 47.6* 47.1*  MCV 84.9 85.9 85.8  PLT 250 248 [id number]   Basic Metabolic Panel: Recent Labs  Lab 03/04/19 2333 03/05/19 0440 03/06/19 0802  NA 141 138 139  K 3.6 3.7 3.7  CL 103 101 103  CO2 29 28 28   GLUCOSE 166* 210* 163*  BUN 8 9 9   CREATININE 0.59 0.73 0.58  CALCIUM 9.1 8.9 8.8*  MG  --  2.0 1.9  PHOS  --  3.6  --    GFR: Estimated Creatinine Clearance: 104.2 mL/min (by C-G formula based on SCr of 0.58 mg/dL). Liver Function Tests: Recent Labs  Lab  03/04/19 2333 03/05/19 0440  AST 18 20  ALT 22 23  ALKPHOS 68 61  BILITOT 0.5 0.6  PROT 7.8 6.9  ALBUMIN 4.1 3.5   No results for input(s): LIPASE, AMYLASE in the last 168 hours. No results for input(s): AMMONIA in the last 168 hours. Coagulation Profile: Recent Labs  Lab 03/05/19 0440  INR 1.0   Cardiac Enzymes: No results for input(s): CKTOTAL, CKMB, CKMBINDEX, TROPONINI in the last 168 hours. BNP (last 3 results) No results for input(s): PROBNP in the last 8760 hours. HbA1C: Recent Labs    03/05/19 0440  HGBA1C 8.2*   CBG: Recent Labs  Lab 03/05/19 1940 03/05/19 2337 03/06/19 0448  03/06/19 0801 03/06/19 1129  GLUCAP 224* 131* 165* 164* 268*   Lipid Profile: Recent Labs    03/06/19 0516  TRIG 89   Thyroid Function Tests: No results for input(s): TSH, T4TOTAL, FREET4, T3FREE, THYROIDAB in the last 72 hours. Anemia Panel: No results for input(s): VITAMINB12, FOLATE, FERRITIN, TIBC, IRON, RETICCTPCT in the last 72 hours. Sepsis Labs: No results for input(s): PROCALCITON, LATICACIDVEN in the last 168 hours.  Recent Results (from the past 240 hour(s))  SARS CORONAVIRUS 2 (TAT 6-24 HRS) Nasopharyngeal Nasopharyngeal Swab     Status: None   Collection Time: 03/04/19 11:58 PM   Specimen: Nasopharyngeal Swab  Result Value Ref Range Status   SARS Coronavirus 2 NEGATIVE NEGATIVE Final    Comment: (NOTE) SARS-CoV-2 target nucleic acids are NOT DETECTED. The SARS-CoV-2 RNA is generally detectable in upper and lower respiratory specimens during the acute phase of infection. Negative results do not preclude SARS-CoV-2 infection, do not rule out co-infections with other pathogens, and should not be used as the sole basis for treatment or other patient management decisions. Negative results must be combined with clinical observations, patient history, and epidemiological information. The expected result is Negative. Fact Sheet for Patients:  SugarRoll.be Fact Sheet for Healthcare Providers: https://www.woods-mathews.com/ This test is not yet approved or cleared by the [place] FDA and  has been authorized for detection and/or diagnosis of SARS-CoV-2 by FDA under an Emergency Use Authorization (EUA). This EUA will remain  in effect (meaning this test can be used) for the duration of the COVID-19 declaration under Section 56 4(b)(1) of the Act, 21 U.S.C. section 360bbb-3(b)(1), unless the authorization is terminated or revoked sooner. Performed at [facility], [place] [address], [place], [place] [address]   Culture, blood (routine x 2)     Status: None (Preliminary result)   Collection Time: 03/05/19  4:08 AM   Specimen: BLOOD  Result Value Ref Range Status   Specimen Description   Final    BLOOD LEFT HAND Performed at [facility] [address], [place], [place] [address]    Special Requests   Final    BOTTLES DRAWN AEROBIC AND ANAEROBIC Blood Culture adequate volume Performed at [facility] [address], [place], [place] [address]    Culture   Final    NO GROWTH < 24 HOURS Performed at [facility] [address] [place],  [address]    Report Status PENDING  Incomplete  SARS Coronavirus 2 by RT PCR (hospital order, performed in [facility] hospital lab) Nasopharyngeal Nasopharyngeal Swab     Status: None   Collection Time: 03/05/19  8:53 AM   Specimen: Nasopharyngeal Swab  Result Value Ref Range Status   SARS Coronavirus 2 NEGATIVE NEGATIVE Final    Comment: (NOTE) If result is NEGATIVE SARS-CoV-2 target nucleic acids are NOT DETECTED. The SARS-CoV-2 RNA is generally detectable in upper and lower  respiratory specimens during the acute phase of infection. The lowest  concentration of SARS-CoV-2 viral copies this assay can detect is 250  copies / mL. A negative result does not preclude SARS-CoV-2 infection   and should not be used as the sole basis for treatment or other  patient management decisions.  A negative result may occur with  improper specimen collection / handling, submission of specimen other  than nasopharyngeal swab, presence of viral mutation(s) within the  areas targeted by this assay, and inadequate number of viral copies  (<250 copies /  mL). A negative result must be combined with clinical  observations, patient history, and epidemiological information. If result is POSITIVE SARS-CoV-2 target nucleic acids are DETECTED. The SARS-CoV-2 RNA is generally detectable in upper and lower  respiratory specimens dur ing the acute phase of infection.  Positive  results are indicative of active infection with SARS-CoV-2.  Clinical  correlation with patient history and other diagnostic information is  necessary to determine patient infection status.  Positive results do  not rule out bacterial infection or co-infection with other viruses. If result is PRESUMPTIVE POSTIVE SARS-CoV-2 nucleic acids MAY BE PRESENT.   A presumptive positive result was obtained on the submitted specimen  and confirmed on repeat testing.  While 2019 novel coronavirus  (SARS-CoV-2) nucleic acids may be present in the submitted sample  additional confirmatory testing may be necessary for epidemiological  and / or clinical management purposes  to differentiate between  SARS-CoV-2 and other Sarbecovirus currently known to infect humans.  If clinically indicated additional testing with an alternate test  methodology [id number]) is advised. The SARS-CoV-2 RNA is generally  detectable in upper and lower respiratory sp ecimens during the acute  phase of infection. The expected result is Negative. Fact Sheet for Patients:  StrictlyIdeas.no Fact Sheet for Healthcare Providers: BankingDealers.co.za This test is not yet approved or cleared by the [place] FDA and  has been authorized for detection and/or diagnosis of SARS-CoV-2 by FDA under an Emergency Use Authorization (EUA).  This EUA will remain in effect (meaning this test can be used) for the duration of the COVID-19 declaration under Section 564(b)(1) of the Act, 21 U.S.C. section 360bbb-3(b)(1), unless the authorization is terminated or revoked sooner. Performed at [facility], [place] [address], [place], [place] [address]   MRSA PCR Screening     Status: None   Collection Time: 03/05/19  9:53 AM   Specimen: Nasopharyngeal  Result Value Ref Range Status   MRSA by PCR NEGATIVE NEGATIVE Final    Comment:        The GeneXpert MRSA Assay (FDA approved for NASAL specimens only), is one component of a comprehensive MRSA colonization surveillance program. It is not intended to diagnose MRSA infection nor to guide or monitor treatment for MRSA infections. Performed at [facility], [place] [address], [place],  [address]          Radiology Studies: Dg Abd 1 View  Result Date: 03/05/2019 CLINICAL DATA:  Check gastric catheter placement EXAM: ABDOMEN - 1 VIEW COMPARISON:  None. FINDINGS: Gastric catheter is noted within the stomach. No free air is seen. No definitive obstructive changes are noted. IMPRESSION: Gastric catheter within the stomach. Electronically Signed   By: Inez Catalina M.D.   On: 03/05/2019 00:02   Dg Chest Portable 1 View  Result Date: 03/05/2019 CLINICAL DATA:  Status post intubation EXAM: PORTABLE CHEST 1 VIEW COMPARISON:  02/21/2019 FINDINGS: Cardiac shadow is mildly enlarged accentuated by the portable technique. Aortic calcifications are noted. Endotracheal tube and gastric catheter are noted in satisfactory position. The endotracheal tube is seen 3 cm above the carina. Minimal left basilar atelectasis is noted. No bony abnormality is seen. IMPRESSION: Tubes and lines as described above. Mild left basilar atelectasis.  Electronically Signed   By: Inez Catalina M.D.   On: 03/05/2019 00:06        Scheduled Meds:  aspirin  81 mg Per Tube Daily   atorvastatin  80 mg Oral q1800   Chlorhexidine Gluconate Cloth  6 each Topical Daily   DULoxetine  60 mg Oral Daily   enoxaparin (LOVENOX) injection  40 mg Subcutaneous Q24H   insulin aspart  0-20 Units Subcutaneous Q4H   mouth rinse  15 mL Mouth Rinse BID   nicotine  14 mg Transdermal Daily   pantoprazole  40 mg Oral Daily   pregabalin  100 mg Oral BID   Continuous Infusions:  dextrose 5 % and 0.45 % NaCl with KCl 10 mEq/L 75 mL/hr at 03/06/19 1014     LOS: 1 day    Time spent: 35 minutes with over 50% of the time coordinating the patient's care    Harold Hedge, DO [facility] Pager [phone]  If 7PM-7AM, please contact night-coverage [email or web address] Password [email or web address] 03/06/2019, 1:35 PM

## 2019-03-06 NOTE — Consult Note (Signed)
[facility] Face-to-Face Psychiatry Consult   Reason for Consult:  Intentional overdose Referring Physician:  Dr Shearon Stalls Patient Identification: [name] MRN:  [id number] Principal Diagnosis: Benzodiazepine overdose Diagnosis:  Principal Problem:   Benzodiazepine overdose Active Problems:   Major depressive disorder, recurrent severe without psychotic features ([place])   Total Time spent with patient: 1 hour  Subjective:   [name] is a 53 y.o. female patient admitted with benzodiazepine overdose.  "I just got upset.  I feel like everybody is better off without me."    Patient seen and evaluated in person by this provider.  On assessment, she reports she was stressed with some people living at home.  She felt like everyone would be better off if she died.  High level of depression overdosed on Xanax prior to admission prior history of suicide attempts, last one in 2016 admit to [facility].  Her current provider is Dr. Harrington Challenger at [facility], [place].  She reports she is seen for ADHD, depression, and anxiety.  Denies hallucinations, homicidal ideations, and no substance use except an occasional cannabis.  Agreeable to coming inpatient for stabilization.  HPI per MD:  This is a 53 year old female with past medical history of COPD not on home oxygen, OSA on CPAP, type 2 diabetes, obesity, anxiety and depression with suicidal attempts in the past who presented via EMS on 03/04/2019 for suicide attempt after taking 15-20 Xanax unknown dosage at home.  Patient was alert when EMS initially evaluated but then lost consciousness.  She was intubated in the ED and admitted to the ICU.  Covid negative.  Extubated successfully 11/11 and placed on one-to-one.  Psychiatry consulted.  Patient transferred out of ICU and to [facility] care on 11/12.  Past Psychiatric History: depression, anxiety, ADHD  Risk to Self:  yes Risk to Others:  none Prior Inpatient Therapy:  yes, [facility] Prior  Outpatient Therapy:  yes, [facility]  Past Medical History:  Past Medical History:  Diagnosis Date  . Anemia   . Anxiety   . Asthmatic bronchitis    normal PFT/ seen by pulmonary no evidence of COPD  . Atypical chest pain 10/05/2017  . Chest pain on respiration 03/25/2014  . Chronic bronchitis ([place])   . Chronic lower back pain   . Chronic respiratory failure ([place]) 10/16/2011   Newly 02 dep 24/7 p discharge from [facility] 01/2013  - 03/13/2014  Walked RA  2 laps @ 185 ft each stopped due to  Sob/ aching in legs, thirsty/ no desat @ slow pace   . Chronic respiratory failure with hypoxia (HCC)    On 2-3 L of oxygen at home  . Cigarette smoker 12/02/2010   Followed in Pulmonary clinic/ [facility]/ [place]   - Limits of effective care reviewed 12/22/2011    . COPD (chronic obstructive pulmonary disease) ([place])   . COPD with chronic bronchitis ([place]) 10/01/2017  . Daily headache   . Depression   . Diabetic peripheral neuropathy ([place])   . DM (diabetes mellitus) type II controlled, neurological manifestation ([place]) 12/02/2010  . Gastric erosions    EGD 08/2010.  [name] GERD (gastroesophageal reflux disease)   . H/O drug abuse ([place]) 11/12/2017   -- scanned document from outside source: [facility] in [place] which showed UDS positive for Adderall /amphetamine usage as well as positive urine for THC.  This test result was collected 08/11/2016 and reported 10/07/2016. - lso review of the chart shows  another positive amphetamine, THC and METH in the urine back on 10/18/2015 under "care everywhere". ---So   . Heavy menses   . High cholesterol   . History of blood transfusion    "related to low HgB" (10/05/2017)  . History of hiatal hernia   . HTN (hypertension)   . Hyperlipidemia associated with type 2 diabetes mellitus ([place]) 10/18/2015  . Hypertension associated with diabetes ([place]) 12/02/2010   D/c acei 12/22/2011 due to psuedowheeze and narcotic dependent cough> ?  Improved - [id number] started bystolic in place of cozar due to cough    . Increased urinary protein excretion   . Internal hemorrhoids    Colonoscopy 5/12.  . Iron deficiency anemia 10/01/2017  . Mixed diabetic hyperlipidemia associated with type 2 diabetes mellitus ([place]) 10/01/2017  . On home oxygen therapy    "5L at night" (10/05/2017)  . OSA on CPAP   . Osteoarthritis    "back" (10/05/2017)  . Oxygen dependent 10/16/2011  . Pneumonia    "lots of times" (10/05/2017)  . Poorly controlled diabetes mellitus ([place]) 11/12/2017  . Pulmonary infiltrates 12/22/2011   Followed in Pulmonary clinic/ [facility]/ [place]    - See CT Chest 05/05/11   . Shortness of breath   . Tachycardia    never had test done since no insurance  . Tobacco use disorder-current smoker greater than 40-pack-year history- since age 2 1 ppd 10/01/2017  . Type II diabetes mellitus ([place])     Past Surgical History:  Procedure Laterality Date  . CESAREAN SECTION  1988; 1989  . CHOLECYSTECTOMY OPEN  1990  . COLONOSCOPY  09/16/2010   [id number] colon/small internal hemorrhoids  . ESOPHAGOGASTRODUODENOSCOPY  09/16/2010   SLF: normal/mild gastritis  . ESOPHAGOGASTRODUODENOSCOPY N/A 10/19/2014   Procedure: ESOPHAGOGASTRODUODENOSCOPY (EGD);  Surgeon: Danie Binder, MD;  Location: [facility] ENDO SUITE;  Service: Endoscopy;  Laterality: N/A;  830  . FRACTURE SURGERY    . HYSTEROSCOPY WITH THERMACHOICE  01/17/2012   Procedure: HYSTEROSCOPY WITH THERMACHOICE;  Surgeon: Florian Buff, MD;  Location: [facility] ORS;  Service: Gynecology;  Laterality: N/A;  total therapy time: 9:13sec  D5W  18 ml in, D5W   60ml out, temperture 87degrees celcious  . KIDNEY SURGERY     as child for blockages  . TONSILLECTOMY    . TUBAL LIGATION  1989  . TYMPANOSTOMY TUBE PLACEMENT Bilateral    "several times when I was a child"  . uterine ablation    . WRIST FRACTURE SURGERY Left 1995   Family History:  Family History  Problem Relation Age of Onset  . Heart  attack Father 88       deceased, etoh use  . Heart disease Father   . Alcohol abuse Father   . Depression Father   . Heart failure Father   . Heart attack Mother 62       deceased  . Diabetes Mother   . Breast cancer Mother   . Heart failure Mother        oxygen dependence, nonsmoker  . Heart disease Mother   . Depression Mother   . Cancer Mother   . Liver disease Maternal Aunt 56       died while on liver transplant list  . Heart attack Maternal Grandmother        premature CAD  . Ulcers Sister   . Hypertension Sister   . Heart failure Sister   . Colon cancer Neg Hx    Family Psychiatric  History:  see above Social History:  Social History   Substance and Sexual Activity  Alcohol Use Yes   Comment: 10/05/2017 "5 - 8 shots tequilia//month"     Social History   Substance and Sexual Activity  Drug Use Yes  . Types: Marijuana   Comment: 10/05/2017 "a few times/wk"    Social History   Socioeconomic History  . Marital status: Married    Spouse name: Not on file  . Number of children: 2  . Years of education: Not on file  . Highest education level: Not on file  Occupational History  . Occupation: unemployed  Social Needs  . Financial resource strain: Not on file  . Food insecurity    Worry: Not on file    Inability: Not on file  . Transportation needs    Medical: Not on file    Non-medical: Not on file  Tobacco Use  . Smoking status: Current Every Day Smoker    Packs/day: 1.00    Years: 39.00    Pack years: 39.00    Types: Cigarettes  . Smokeless tobacco: Never Used  Substance and Sexual Activity  . Alcohol use: Yes    Comment: 10/05/2017 "5 - 8 shots tequilia//month"  . Drug use: Yes    Types: Marijuana    Comment: 10/05/2017 "a few times/wk"  . Sexual activity: Yes    Partners: Male    Birth control/protection: Surgical  Lifestyle  . Physical activity    Days per week: Not on file    Minutes per session: Not on file  . Stress: Not on file   Relationships  . Social Herbalist on phone: Not on file    Gets together: Not on file    Attends religious service: Not on file    Active member of club or organization: Not on file    Attends meetings of clubs or organizations: Not on file    Relationship status: Not on file  Other Topics Concern  . Not on file  Social History Narrative  . Not on file   Additional Social History:    Allergies:   Allergies  Allergen Reactions  . Codeine Itching and Nausea Only  . Metformin And Related Diarrhea and Other (See Comments)    Stomach pain/nausea  . Wellbutrin [Bupropion Hcl] Hives  . Acyclovir And Related Rash    Labs:  Results for orders placed or performed during the hospital encounter of 03/04/19 (from the past 48 hour(s))  Comprehensive metabolic panel     Status: Abnormal   Collection Time: 03/04/19 11:33 PM  Result Value Ref Range   Sodium 141 135 - 145 mmol/L   Potassium 3.6 3.5 - 5.1 mmol/L   Chloride 103 98 - 111 mmol/L   CO2 29 22 - 32 mmol/L   Glucose, Bld 166 (H) 70 - 99 mg/dL   BUN 8 6 - 20 mg/dL   Creatinine, Ser 0.59 0.44 - 1.00 mg/dL   Calcium 9.1 8.9 - 10.3 mg/dL   Total Protein 7.8 6.5 - 8.1 g/dL   Albumin 4.1 3.5 - 5.0 g/dL   AST 18 15 - 41 U/L   ALT 22 0 - 44 U/L   Alkaline Phosphatase 68 38 - 126 U/L   Total Bilirubin 0.5 0.3 - 1.2 mg/dL   GFR calc non Af Amer >60 >60 mL/min   GFR calc Af Amer >60 >60 mL/min   Anion gap 9 5 - 15    Comment: Performed at  [facility], [place] [address], [place], [place] [address]  Ethanol     Status: None   Collection Time: 03/04/19 11:33 PM  Result Value Ref Range   Alcohol, Ethyl (B) <10 <10 mg/dL    Comment: (NOTE) Lowest detectable limit for serum alcohol is 10 mg/dL. For medical purposes only. Performed at [facility], [place] [address], [place], [place] [id number]   Salicylate level     Status: None   Collection Time: 03/04/19 11:33 PM  Result Value Ref  Range   Salicylate Lvl [id number] 2.8 - 30.0 mg/dL    Comment: Performed at [facility], [place] [address] [place], [place] [address]  Acetaminophen level     Status: Abnormal   Collection Time: 03/04/19 11:33 PM  Result Value Ref Range   Acetaminophen (Tylenol), Serum <10 (L) 10 - 30 ug/mL    Comment: (NOTE) Therapeutic concentrations vary significantly. A range of 10-30 ug/mL  may be an effective concentration for many patients. However, some  are best treated at concentrations outside of this range. Acetaminophen concentrations >150 ug/mL at 4 hours after ingestion  and >50 ug/mL at 12 hours after ingestion are often associated with  toxic reactions. Performed at [facility], [place] [address] [place], [place] [address]   cbc     Status: Abnormal   Collection Time: 03/04/19 11:33 PM  Result Value Ref Range   WBC 15.3 (H) 4.0 - 10.5 K/uL   RBC 5.97 (H) 3.87 - 5.11 MIL/uL   Hemoglobin 15.3 (H) 12.0 - 15.0 g/dL   HCT 50.7 (H) 36.0 - 46.0 %   MCV 84.9 80.0 - 100.0 fL   MCH 25.6 (L) 26.0 - 34.0 pg   MCHC 30.2 30.0 - 36.0 g/dL   RDW 18.5 (H) 11.5 - 15.5 %   Platelets 250 150 - 400 K/uL   nRBC 0.0 0.0 - 0.2 %    Comment: Performed at [facility], [place] [address] [place],  [address]  CBG monitoring, ED     Status: Abnormal   Collection Time: 03/04/19 11:42 PM  Result Value Ref Range   Glucose-Capillary 167 (H) 70 - 99 mg/dL   Comment 1 Notify RN   SARS CORONAVIRUS 2 (TAT 6-24 HRS) Nasopharyngeal Nasopharyngeal Swab     Status: None   Collection Time: 03/04/19 11:58 PM   Specimen: Nasopharyngeal Swab  Result Value Ref Range   SARS Coronavirus 2 NEGATIVE NEGATIVE    Comment: (NOTE) SARS-CoV-2 target nucleic acids are NOT DETECTED. The SARS-CoV-2 RNA is generally detectable in upper and lower respiratory specimens during the acute phase of infection. Negative results do not preclude SARS-CoV-2 infection, do not rule  out co-infections with other pathogens, and should not be used as the sole basis for treatment or other patient management decisions. Negative results must be combined with clinical observations, patient history, and epidemiological information. The expected result is Negative. Fact Sheet for Patients: SugarRoll.be Fact Sheet for Healthcare Providers: https://www.woods-mathews.com/ This test is not yet approved or cleared by the [place] FDA and  has been authorized for detection and/or diagnosis of SARS-CoV-2 by FDA under an Emergency Use Authorization (EUA). This EUA will remain  in effect (meaning this test can be used) for the duration of the COVID-19 declaration under Section 56 4(b)(1) of the Act, 21 U.S.C. section 360bbb-3(b)(1), unless the authorization is terminated or revoked sooner. Performed at [facility], [place] [address] [place], [place]  [id number]   Blood gas, arterial     Status: Abnormal   Collection Time: 03/05/19 12:20 AM  Result Value Ref Range   FIO2 100.00    pH, Arterial 7.315 (L) 7.350 - 7.450   pCO2 arterial 57.1 (H) 32.0 - 48.0 mmHg   pO2, Arterial 72.2 (L) 83.0 - 108.0 mmHg   Bicarbonate 28.4 (H) 20.0 - 28.0 mmol/L   Acid-Base Excess 1.1 0.0 - 2.0 mmol/L   O2 Saturation 93.2 %   Patient temperature 97.7     Comment: Performed at [facility], [place] [address] [place], [place] [address]  Pregnancy, urine     Status: None   Collection Time: 03/05/19 12:40 AM  Result Value Ref Range   Preg Test, Ur NEGATIVE NEGATIVE    Comment:        THE SENSITIVITY OF THIS METHODOLOGY IS >20 mIU/mL. Performed at [facility], [place] [address], [place], [place] [address]   Culture, blood (routine x 2)     Status: None (Preliminary result)   Collection Time: 03/05/19  4:08 AM   Specimen: BLOOD  Result Value Ref Range   Specimen Description      BLOOD LEFT HAND Performed at  [place] [address] [place], [place] [address]    Special Requests      BOTTLES DRAWN AEROBIC AND ANAEROBIC Blood Culture adequate volume Performed at [place] [address], [place], [place] [address]    Culture      NO GROWTH < 24 HOURS Performed at [place] [address], [place], [place] [address]    Report Status PENDING   CBG monitoring, ED     Status: Abnormal   Collection Time: 03/05/19  4:39 AM  Result Value Ref Range   Glucose-Capillary 193 (H) 70 - 99 mg/dL  HIV Antibody (routine testing w rflx)     Status: None   Collection Time: 03/05/19  4:40 AM  Result Value Ref Range   HIV Screen 4th Generation wRfx NON REACTIVE NON REACTIVE    Comment: Performed at [facility], [place] [address] [place], [place] [address]  Comprehensive metabolic panel     Status: Abnormal   Collection Time: 03/05/19  4:40 AM  Result Value Ref Range   Sodium 138 135 - 145 mmol/L   Potassium 3.7 3.5 - 5.1 mmol/L   Chloride 101 98 - 111 mmol/L   CO2 28 22 - 32 mmol/L   Glucose, Bld 210 (H) 70 - 99 mg/dL   BUN 9 6 - 20 mg/dL   Creatinine, Ser 0.73 0.44 - 1.00 mg/dL   Calcium 8.9 8.9 - 10.3 mg/dL   Total Protein 6.9 6.5 - 8.1 g/dL   Albumin 3.5 3.5 - 5.0 g/dL   AST 20 15 - 41 U/L   ALT 23 0 - 44 U/L   Alkaline Phosphatase 61 38 - 126 U/L   Total Bilirubin 0.6 0.3 - 1.2 mg/dL   GFR calc non Af Amer >60 >60 mL/min   GFR calc Af Amer >60 >60 mL/min   Anion gap 9 5 - 15    Comment: Performed at [facility], [place] [address], [place], [place] [address]  Magnesium     Status: None   Collection Time: 03/05/19  4:40 AM  Result Value Ref Range   Magnesium 2.0 1.7 - 2.4 mg/dL    Comment: Performed at [place], [address] [place], [place] [address]  Phosphorus     Status: None   Collection Time: 03/05/19  4:40 AM  Result Value Ref Range   Phosphorus 3.6 2.5 - 4.6 mg/dL    Comment:  Performed at [facility], [place] [address] [place], [place] [address]  CBC WITH DIFFERENTIAL     Status: Abnormal   Collection Time: 03/05/19  4:40 AM  Result Value Ref Range   WBC 21.8 (H) 4.0 - 10.5 K/uL   RBC 5.54 (H) 3.87 - 5.11 MIL/uL   Hemoglobin 14.1 12.0 - 15.0 g/dL   HCT 47.6 (H) 36.0 - 46.0 %   MCV 85.9 80.0 - 100.0 fL   MCH 25.5 (L) 26.0 - 34.0 pg   MCHC 29.6 (L) 30.0 - 36.0 g/dL   RDW 17.8 (H) 11.5 - 15.5 %   Platelets 248 150 - 400 K/uL   nRBC 0.0 0.0 - 0.2 %   Neutrophils Relative % 79 %   Neutro Abs 17.4 (H) 1.7 - 7.7 K/uL   Lymphocytes Relative 14 %   Lymphs Abs 3.0 0.7 - 4.0 K/uL   Monocytes Relative 5 %   Monocytes Absolute 1.1 (H) 0.1 - 1.0 K/uL   Eosinophils Relative 1 %   Eosinophils Absolute 0.1 0.0 - 0.5 K/uL   Basophils Relative 0 %   Basophils Absolute 0.1 0.0 - 0.1 K/uL   Immature Granulocytes 1 %   Abs Immature Granulocytes 0.15 (H) 0.00 - 0.07 K/uL    Comment: Performed at [facility], [place] [address], [place], [place] [address]  Protime-INR     Status: None   Collection Time: 03/05/19  4:40 AM  Result Value Ref Range   Prothrombin Time 12.9 11.4 - 15.2 seconds   INR 1.0 0.8 - 1.2    Comment: (NOTE) INR goal varies based on device and disease states. Performed at [facility], [place] [address], [place], [place] [address]   Hemoglobin A1c     Status: Abnormal   Collection Time: 03/05/19  4:40 AM  Result Value Ref Range   Hgb A1c MFr Bld 8.2 (H) 4.8 - 5.6 %    Comment: (NOTE) Pre diabetes:          5.7%-6.4% Diabetes:              >6.4% Glycemic control for   <7.0% adults with diabetes    Mean Plasma Glucose 188.64 mg/dL    Comment: Performed at [place] [address] [place], [place] [address]  Glucose, capillary     Status: Abnormal   Collection Time: 03/05/19  7:57 AM  Result Value Ref Range   Glucose-Capillary 209 (H) 70 - 99 mg/dL  SARS Coronavirus 2 by RT PCR  (hospital order, performed in [facility] hospital lab) Nasopharyngeal Nasopharyngeal Swab     Status: None   Collection Time: 03/05/19  8:53 AM   Specimen: Nasopharyngeal Swab  Result Value Ref Range   SARS Coronavirus 2 NEGATIVE NEGATIVE    Comment: (NOTE) If result is NEGATIVE SARS-CoV-2 target nucleic acids are NOT DETECTED. The SARS-CoV-2 RNA is generally detectable in upper and lower  respiratory specimens during the acute phase of infection. The lowest  concentration of SARS-CoV-2 viral copies this assay can detect is 250  copies / mL. A negative result does not preclude SARS-CoV-2 infection  and should not be used as the sole basis for treatment or other  patient management decisions.  A negative result may occur with  improper specimen collection / handling, submission of specimen other  than nasopharyngeal swab, presence of viral mutation(s) within the  areas targeted by this assay, and inadequate number of viral copies  (<250 copies / mL). A negative result must be combined with clinical  observations, patient history, and epidemiological information. If result is POSITIVE SARS-CoV-2 target nucleic acids are DETECTED. The SARS-CoV-2 RNA is generally detectable in upper and lower  respiratory specimens dur ing the acute phase of infection.  Positive  results are indicative of active infection with SARS-CoV-2.  Clinical  correlation with patient history and other diagnostic information is  necessary to determine patient infection status.  Positive results do  not rule out bacterial infection or co-infection with other viruses. If result is PRESUMPTIVE POSTIVE SARS-CoV-2 nucleic acids MAY BE PRESENT.   A presumptive positive result was obtained on the submitted specimen  and confirmed on repeat testing.  While 2019 novel coronavirus  (SARS-CoV-2) nucleic acids may be present in the submitted sample  additional confirmatory testing may be necessary for epidemiological  and /  or clinical management purposes  to differentiate between  SARS-CoV-2 and other Sarbecovirus currently known to infect humans.  If clinically indicated additional testing with an alternate test  methodology [phone]) is advised. The SARS-CoV-2 RNA is generally  detectable in upper and lower respiratory sp ecimens during the acute  phase of infection. The expected result is Negative. Fact Sheet for Patients:  StrictlyIdeas.no Fact Sheet for Healthcare Providers: BankingDealers.co.za This test is not yet approved or cleared by the [place] FDA and has been authorized for detection and/or diagnosis of SARS-CoV-2 by FDA under an Emergency Use Authorization (EUA).  This EUA will remain in effect (meaning this test can be used) for the duration of the COVID-19 declaration under Section 564(b)(1) of the Act, 21 U.S.C. section 360bbb-3(b)(1), unless the authorization is terminated or revoked sooner. Performed at [facility], [place] [address], [place], [place] [address]   MRSA PCR Screening     Status: None   Collection Time: 03/05/19  9:53 AM   Specimen: Nasopharyngeal  Result Value Ref Range   MRSA by PCR NEGATIVE NEGATIVE    Comment:        The GeneXpert MRSA Assay (FDA approved for NASAL specimens only), is one component of a comprehensive MRSA colonization surveillance program. It is not intended to diagnose MRSA infection nor to guide or monitor treatment for MRSA infections. Performed at [facility], [place] [address] [place], [place] [address]   Glucose, capillary     Status: Abnormal   Collection Time: 03/05/19 11:48 AM  Result Value Ref Range   Glucose-Capillary 200 (H) 70 - 99 mg/dL  Glucose, capillary     Status: Abnormal   Collection Time: 03/05/19  3:14 PM  Result Value Ref Range   Glucose-Capillary 186 (H) 70 - 99 mg/dL   Comment 1 Notify RN    Comment 2 Document in Chart    Glucose, capillary     Status: Abnormal   Collection Time: 03/05/19  7:40 PM  Result Value Ref Range   Glucose-Capillary 224 (H) 70 - 99 mg/dL  Glucose, capillary     Status: Abnormal   Collection Time: 03/05/19 11:37 PM  Result Value Ref Range   Glucose-Capillary 131 (H) 70 - 99 mg/dL  Glucose, capillary     Status: Abnormal   Collection Time: 03/06/19  4:48 AM  Result Value Ref  Range   Glucose-Capillary 165 (H) 70 - 99 mg/dL  Triglycerides     Status: None   Collection Time: 03/06/19  5:16 AM  Result Value Ref Range   Triglycerides 89 <150 mg/dL    Comment: Performed at [facility], [place] [address] [place], [place] [address]  Glucose, capillary     Status: Abnormal   Collection Time: 03/06/19  8:01 AM  Result Value Ref Range   Glucose-Capillary 164 (H) 70 - 99 mg/dL   Comment 1 Notify RN    Comment 2 Document in Chart   CBC     Status: Abnormal   Collection Time: 03/06/19  8:02 AM  Result Value Ref Range   WBC 12.9 (H) 4.0 - 10.5 K/uL   RBC 5.49 (H) 3.87 - 5.11 MIL/uL   Hemoglobin 13.9 12.0 - 15.0 g/dL   HCT 47.1 (H) 36.0 - 46.0 %   MCV 85.8 80.0 - 100.0 fL   MCH 25.3 (L) 26.0 - 34.0 pg   MCHC 29.5 (L) 30.0 - 36.0 g/dL   RDW 17.2 (H) 11.5 - 15.5 %   Platelets 218 150 - 400 K/uL   nRBC 0.0 0.0 - 0.2 %    Comment: Performed at [facility], [place] [address] [place], [place] [id number]  Basic metabolic panel     Status: Abnormal   Collection Time: 03/06/19  8:02 AM  Result Value Ref Range   Sodium 139 135 - 145 mmol/L   Potassium 3.7 3.5 - 5.1 mmol/L   Chloride 103 98 - 111 mmol/L   CO2 28 22 - 32 mmol/L   Glucose, Bld 163 (H) 70 - 99 mg/dL   BUN 9 6 - 20 mg/dL   Creatinine, Ser 0.58 0.44 - 1.00 mg/dL   Calcium 8.8 (L) 8.9 - 10.3 mg/dL   GFR calc non Af Amer >60 >60 mL/min   GFR calc Af Amer >60 >60 mL/min   Anion gap 8 5 - 15    Comment: Performed at [facility], [place] [address], [place], [place] [address]   Magnesium     Status: None   Collection Time: 03/06/19  8:02 AM  Result Value Ref Range   Magnesium 1.9 1.7 - 2.4 mg/dL    Comment: Performed at [facility], [place] [address], [place], [place] [address]  Glucose, capillary     Status: Abnormal   Collection Time: 03/06/19 11:29 AM  Result Value Ref Range   Glucose-Capillary 268 (H) 70 - 99 mg/dL   Comment 1 Notify RN    Comment 2 Document in Chart     Current Facility-Administered Medications  Medication Dose Route Frequency Provider Last Rate Last Dose  . acetaminophen (TYLENOL) tablet 650 mg  650 mg Oral Q4H PRN Collier Bullock, MD      . aspirin chewable tablet 81 mg  81 mg Per Tube Daily Spero Geralds, MD   81 mg at 03/06/19 [id number]  . atorvastatin (LIPITOR) tablet 80 mg  80 mg Oral q1800 Collier Bullock, MD   80 mg at 03/05/19 1701  . [START ON 03/07/2019] canagliflozin (INVOKANA) tablet 300 mg  300 mg Oral QAC breakfast Harold Hedge, MD      . Chlorhexidine Gluconate Cloth 2 % PADS 6 each  6 each Topical Daily Spero Geralds, MD   6 each at 03/06/19 [phone]  . DULoxetine (CYMBALTA) DR capsule 60 mg  60 mg Oral Daily Collier Bullock, MD  60 mg at 03/06/19 0938  . enoxaparin (LOVENOX) injection 40 mg  40 mg Subcutaneous Q24H Collier Bullock, MD   40 mg at 03/06/19 [id number]  . fentaNYL (SUBLIMAZE) injection 100 mcg  100 mcg Intravenous Q1H PRN Collier Bullock, MD   100 mcg at 03/05/19 0610  . fluticasone furoate-vilanterol (BREO ELLIPTA) 200-25 MCG/INH 1 puff  1 puff Inhalation Daily Harold Hedge, MD      . insulin aspart (novoLOG) injection 0-15 Units  0-15 Units Subcutaneous TID WC Harold Hedge, MD      . insulin aspart (novoLOG) injection 0-5 Units  0-5 Units Subcutaneous QHS Harold Hedge, MD      . insulin glargine (LANTUS) injection 24 Units  24 Units Subcutaneous QHS Harold Hedge, MD      . ipratropium-albuterol (DUONEB) 0.5-2.5 (3) MG/3ML nebulizer solution 3 mL  3 mL Nebulization Q4H PRN Collier Bullock, MD      . losartan (COZAAR) tablet 25 mg  25 mg Oral Daily Harold Hedge, MD      . MEDLINE mouth rinse  15 mL Mouth Rinse BID Spero Geralds, MD   15 mL at 03/06/19 0938  . nicotine (NICODERM CQ - dosed in mg/24 hours) patch 14 mg  14 mg Transdermal Daily Spero Geralds, MD   14 mg at 03/06/19 [id number]  . ondansetron (ZOFRAN) injection 4 mg  4 mg Intravenous Q6H PRN Collier Bullock, MD      . pantoprazole (PROTONIX) EC tablet 40 mg  40 mg Oral Daily Harold Hedge, MD   40 mg at 03/06/19 [id number]  . pregabalin (LYRICA) capsule 100 mg  100 mg Oral BID Collier Bullock, MD   100 mg at 03/06/19 [id number]  . umeclidinium bromide (INCRUSE ELLIPTA) 62.5 MCG/INH 1 puff  1 puff Inhalation Daily Harold Hedge, MD        Musculoskeletal: Strength & Muscle Tone: decreased Gait & Station: did not witness Patient leans: N/A  Psychiatric Specialty Exam: Physical Exam  Nursing note and vitals reviewed. Constitutional: She is oriented to person, place, and time. She appears well-developed and well-nourished.  HENT:  Head: Normocephalic.  Neck: Normal range of motion.  Respiratory: Effort normal.  Neurological: She is alert and oriented to person, place, and time.  Psychiatric: Her speech is normal and behavior is normal. Her mood appears anxious. Her affect is blunt. Cognition and memory are normal. She expresses impulsivity. She exhibits a depressed mood. She expresses suicidal ideation. She expresses suicidal plans.    Review of Systems  Gastrointestinal: Positive for nausea.  Neurological: Positive for weakness.  Psychiatric/Behavioral: Positive for depression and suicidal ideas. The patient is nervous/anxious.   All other systems reviewed and are negative.   Blood pressure 126/67, pulse (!) 101, temperature 98.5 F (36.9 C), temperature source Oral, resp. rate (!) 22, height 5' 4.5" (1.638 m), weight 119.1 kg, SpO2 93 %.Body mass index is 44.37 kg/m.  General Appearance: Disheveled  Eye  Contact:  Fair  Speech:  Normal Rate  Volume:  Decreased  Mood:  Anxious and Depressed  Affect:  Congruent  Thought Process:  Coherent and Descriptions of Associations: Intact  Orientation:  Full (Time, Place, and Person)  Thought Content:  Rumination  Suicidal Thoughts:  Yes.  with intent/plan  Homicidal Thoughts:  No  Memory:  Immediate;   Fair Recent;   Fair Remote;   Fair  Judgement:  Poor  Insight:  Fair  Psychomotor Activity:  Decreased  Concentration:  Concentration: Fair and Attention Span: Fair  Recall:  [employer] of Knowledge:  Fair  Language:  Fair  Akathisia:  No  Handed:  Right  AIMS (if indicated):     Assets:  Housing Leisure Time Resilience Social Support  ADL's:  Intact  Cognition:  WNL  Sleep:      53 year old female with history of depression, anxiety, and ADHD.  Admitted to the hospital after an intentional overdose on Xanax and attempt to end her life.  She is stressed with her current living situation and feels like everyone would be better off without her.  She lives with her husband and 2 other people in the same home.  Continues to endorse feelings of hopelessness, helplessness, and worthlessness.  Agreeable to come inpatient to get assistance for her depression and suicidal ideations.  Treatment Plan Summary: Major depressive disorder, recurrent, severe without psychosis: -Continue Cymbalta 60 mg daily  Benzodiazepine dependence: -Recommend Ativan 1 mg every 8 hours as needed for withdrawal symptoms  Disposition: Recommend psychiatric Inpatient admission when medically cleared.  Waylan Boga, NP 03/06/2019 3:31 PM

## 2019-03-07 ENCOUNTER — Inpatient Hospital Stay (HOSPITAL_COMMUNITY): Payer: Medicare HMO

## 2019-03-07 ENCOUNTER — Inpatient Hospital Stay
Admission: RE | Admit: 2019-03-07 | Discharge: 2019-03-11 | DRG: 885 | Disposition: A | Discharge status: Home / Self Care | Payer: Medicare HMO | Source: Intra-hospital | Attending: Psychiatry | Admitting: Psychiatry

## 2019-03-07 DIAGNOSIS — E119 Type 2 diabetes mellitus without complications: Secondary | ICD-10-CM | POA: Diagnosis present

## 2019-03-07 DIAGNOSIS — E1142 Type 2 diabetes mellitus with diabetic polyneuropathy: Secondary | ICD-10-CM

## 2019-03-07 DIAGNOSIS — K219 Gastro-esophageal reflux disease without esophagitis: Secondary | ICD-10-CM | POA: Diagnosis present

## 2019-03-07 DIAGNOSIS — F411 Generalized anxiety disorder: Secondary | ICD-10-CM | POA: Diagnosis present

## 2019-03-07 DIAGNOSIS — E1169 Type 2 diabetes mellitus with other specified complication: Secondary | ICD-10-CM

## 2019-03-07 DIAGNOSIS — E1159 Type 2 diabetes mellitus with other circulatory complications: Secondary | ICD-10-CM | POA: Diagnosis present

## 2019-03-07 DIAGNOSIS — Z794 Long term (current) use of insulin: Secondary | ICD-10-CM

## 2019-03-07 DIAGNOSIS — F17210 Nicotine dependence, cigarettes, uncomplicated: Secondary | ICD-10-CM | POA: Diagnosis present

## 2019-03-07 DIAGNOSIS — G4733 Obstructive sleep apnea (adult) (pediatric): Secondary | ICD-10-CM | POA: Diagnosis present

## 2019-03-07 DIAGNOSIS — I208 Other forms of angina pectoris: Secondary | ICD-10-CM | POA: Diagnosis present

## 2019-03-07 DIAGNOSIS — J441 Chronic obstructive pulmonary disease with (acute) exacerbation: Secondary | ICD-10-CM | POA: Diagnosis present

## 2019-03-07 DIAGNOSIS — I10 Essential (primary) hypertension: Secondary | ICD-10-CM | POA: Diagnosis present

## 2019-03-07 DIAGNOSIS — T424X2A Poisoning by benzodiazepines, intentional self-harm, initial encounter: Secondary | ICD-10-CM | POA: Diagnosis present

## 2019-03-07 DIAGNOSIS — I152 Hypertension secondary to endocrine disorders: Secondary | ICD-10-CM | POA: Diagnosis present

## 2019-03-07 DIAGNOSIS — F322 Major depressive disorder, single episode, severe without psychotic features: Principal | ICD-10-CM | POA: Diagnosis present

## 2019-03-07 DIAGNOSIS — G4700 Insomnia, unspecified: Secondary | ICD-10-CM | POA: Diagnosis present

## 2019-03-07 DIAGNOSIS — J449 Chronic obstructive pulmonary disease, unspecified: Secondary | ICD-10-CM

## 2019-03-07 DIAGNOSIS — F332 Major depressive disorder, recurrent severe without psychotic features: Secondary | ICD-10-CM

## 2019-03-07 LAB — GLUCOSE, CAPILLARY
Glucose-Capillary: 167 mg/dL — ABNORMAL HIGH (ref 70–99)
Glucose-Capillary: 167 mg/dL — ABNORMAL HIGH (ref 70–99)
Glucose-Capillary: 179 mg/dL — ABNORMAL HIGH (ref 70–99)
Glucose-Capillary: 185 mg/dL — ABNORMAL HIGH (ref 70–99)
Glucose-Capillary: 189 mg/dL — ABNORMAL HIGH (ref 70–99)
Glucose-Capillary: 196 mg/dL — ABNORMAL HIGH (ref 70–99)

## 2019-03-07 LAB — CBC
HCT: 44.6 % (ref 36.0–46.0)
Hemoglobin: 13.3 g/dL (ref 12.0–15.0)
MCH: 25.2 pg — ABNORMAL LOW (ref 26.0–34.0)
MCHC: 29.8 g/dL — ABNORMAL LOW (ref 30.0–36.0)
MCV: 84.6 fL (ref 80.0–100.0)
Platelets: 216 10*3/uL (ref 150–400)
RBC: 5.27 MIL/uL — ABNORMAL HIGH (ref 3.87–5.11)
RDW: 16.7 % — ABNORMAL HIGH (ref 11.5–15.5)
WBC: 12.1 10*3/uL — ABNORMAL HIGH (ref 4.0–10.5)
nRBC: 0 % (ref 0.0–0.2)

## 2019-03-07 LAB — DIFFERENTIAL
Abs Immature Granulocytes: 0.06 10*3/uL (ref 0.00–0.07)
Basophils Absolute: 0.1 10*3/uL (ref 0.0–0.1)
Basophils Relative: 0 %
Eosinophils Absolute: 0.3 10*3/uL (ref 0.0–0.5)
Eosinophils Relative: 2 %
Immature Granulocytes: 1 %
Lymphocytes Relative: 29 %
Lymphs Abs: 3.7 10*3/uL (ref 0.7–4.0)
Monocytes Absolute: 0.9 10*3/uL (ref 0.1–1.0)
Monocytes Relative: 7 %
Neutro Abs: 7.8 10*3/uL — ABNORMAL HIGH (ref 1.7–7.7)
Neutrophils Relative %: 61 %

## 2019-03-07 LAB — BASIC METABOLIC PANEL
Anion gap: 10 (ref 5–15)
BUN: 12 mg/dL (ref 6–20)
CO2: 24 mmol/L (ref 22–32)
Calcium: 8.7 mg/dL — ABNORMAL LOW (ref 8.9–10.3)
Chloride: 102 mmol/L (ref 98–111)
Creatinine, Ser: 0.59 mg/dL (ref 0.44–1.00)
GFR calc Af Amer: >60 mL/min (ref >60)
GFR calc non Af Amer: >60 mL/min (ref >60)
Glucose, Bld: 163 mg/dL — ABNORMAL HIGH (ref 70–99)
Potassium: 3.6 mmol/L (ref 3.5–5.1)
Sodium: 136 mmol/L (ref 135–145)

## 2019-03-07 LAB — MAGNESIUM: Magnesium: 1.7 mg/dL (ref 1.7–2.4)

## 2019-03-07 MED ORDER — UMECLIDINIUM BROMIDE 62.5 MCG/INH IN AEPB
1.0000 | INHALATION_SPRAY | Freq: Every day | RESPIRATORY_TRACT | Status: DC
  Administered 2019-03-08 – 2019-03-11 (×4): 1 via RESPIRATORY_TRACT
  Filled 2019-03-07: qty 7

## 2019-03-07 MED ORDER — INSULIN ASPART 100 UNIT/ML ~~LOC~~ SOLN
0.0000 [IU] | Freq: Three times a day (TID) | SUBCUTANEOUS | Status: DC
  Administered 2019-03-08 – 2019-03-09 (×5): 2 [IU] via SUBCUTANEOUS
  Administered 2019-03-09 – 2019-03-10 (×3): 3 [IU] via SUBCUTANEOUS
  Filled 2019-03-07 (×8): qty 1

## 2019-03-07 MED ORDER — IPRATROPIUM-ALBUTEROL 0.5-2.5 (3) MG/3ML IN SOLN
3.0000 mL | Freq: Four times a day (QID) | RESPIRATORY_TRACT | Status: DC
  Administered 2019-03-07: 3 mL via RESPIRATORY_TRACT
  Filled 2019-03-07 (×2): qty 3

## 2019-03-07 MED ORDER — NICOTINE 21 MG/24HR TD PT24: 21.0000 mg | MEDICATED_PATCH | Freq: Every day | TRANSDERMAL | Status: DC

## 2019-03-07 MED ORDER — INSULIN ASPART 100 UNIT/ML ~~LOC~~ SOLN: 0.0000 [IU] | Freq: Every day | SUBCUTANEOUS | Status: DC

## 2019-03-07 MED ORDER — PREGABALIN 25 MG PO CAPS
100.0000 mg | ORAL_CAPSULE | Freq: Two times a day (BID) | ORAL | Status: DC
  Administered 2019-03-07 – 2019-03-10 (×6): 100 mg via ORAL
  Filled 2019-03-07 (×6): qty 4

## 2019-03-07 MED ORDER — NICOTINE 21 MG/24HR TD PT24
21.0000 mg | MEDICATED_PATCH | Freq: Every day | TRANSDERMAL | Status: DC
  Administered 2019-03-08 – 2019-03-11 (×4): 21 mg via TRANSDERMAL
  Filled 2019-03-07 (×4): qty 1

## 2019-03-07 MED ORDER — DIPHENHYDRAMINE HCL 25 MG PO CAPS
25.0000 mg | ORAL_CAPSULE | Freq: Four times a day (QID) | ORAL | Status: DC | PRN
  Administered 2019-03-08 – 2019-03-09 (×2): 25 mg via ORAL
  Filled 2019-03-07 (×2): qty 1

## 2019-03-07 MED ORDER — LOSARTAN POTASSIUM 25 MG PO TABS
25.0000 mg | ORAL_TABLET | Freq: Every day | ORAL | Status: DC
  Administered 2019-03-08 – 2019-03-11 (×4): 25 mg via ORAL
  Filled 2019-03-07 (×5): qty 1

## 2019-03-07 MED ORDER — INSULIN GLARGINE 100 UNIT/ML ~~LOC~~ SOLN
24.0000 [IU] | Freq: Every day | SUBCUTANEOUS | Status: DC
  Administered 2019-03-07 – 2019-03-09 (×3): 24 [IU] via SUBCUTANEOUS
  Filled 2019-03-07 (×4): qty 0.24

## 2019-03-07 MED ORDER — DIPHENHYDRAMINE HCL 50 MG/ML IJ SOLN
50.0000 mg | Freq: Once | INTRAMUSCULAR | Status: AC
  Administered 2019-03-07: 22:00:00 50 mg via INTRAMUSCULAR
  Filled 2019-03-07: qty 1

## 2019-03-07 MED ORDER — ASPIRIN 81 MG PO CHEW
81.0000 mg | CHEWABLE_TABLET | Freq: Every day | ORAL | Status: DC
  Administered 2019-03-08: 81 mg via ORAL
  Filled 2019-03-07: qty 1

## 2019-03-07 MED ORDER — PANTOPRAZOLE SODIUM 40 MG PO TBEC
40.0000 mg | DELAYED_RELEASE_TABLET | Freq: Every day | ORAL | Status: DC
  Administered 2019-03-08 – 2019-03-11 (×4): 40 mg via ORAL
  Filled 2019-03-07 (×4): qty 1

## 2019-03-07 MED ORDER — NON FORMULARY: 1.0000 | Freq: Every day | Status: DC

## 2019-03-07 MED ORDER — DULOXETINE HCL 30 MG PO CPEP
60.0000 mg | ORAL_CAPSULE | Freq: Every day | ORAL | Status: DC
  Administered 2019-03-08 – 2019-03-11 (×4): 60 mg via ORAL
  Filled 2019-03-07 (×4): qty 2

## 2019-03-07 MED ORDER — ACETAMINOPHEN 325 MG PO TABS
650.0000 mg | ORAL_TABLET | Freq: Four times a day (QID) | ORAL | Status: DC | PRN
  Administered 2019-03-08 – 2019-03-10 (×8): 650 mg via ORAL
  Filled 2019-03-07 (×8): qty 2

## 2019-03-07 MED ORDER — NICOTINE 21 MG/24HR TD PT24: 21.0000 mg | MEDICATED_PATCH | Freq: Every day | TRANSDERMAL | 0 refills | Status: DC

## 2019-03-07 MED ORDER — ATORVASTATIN CALCIUM 20 MG PO TABS
80.0000 mg | ORAL_TABLET | Freq: Every day | ORAL | Status: DC
  Administered 2019-03-07 – 2019-03-10 (×4): 80 mg via ORAL
  Filled 2019-03-07 (×4): qty 4

## 2019-03-07 MED ORDER — HYDROCORTISONE 1 % EX CREA
TOPICAL_CREAM | Freq: Three times a day (TID) | CUTANEOUS | Status: DC | PRN
  Administered 2019-03-08: 19:00:00 via TOPICAL
  Filled 2019-03-07: qty 28

## 2019-03-07 MED ORDER — ALUM & MAG HYDROXIDE-SIMETH 200-200-20 MG/5ML PO SUSP: 30.0000 mL | ORAL | Status: DC | PRN

## 2019-03-07 MED ORDER — CANAGLIFLOZIN 100 MG PO TABS
300.0000 mg | ORAL_TABLET | Freq: Every day | ORAL | Status: DC
  Filled 2019-03-07 (×6): qty 3

## 2019-03-07 MED ORDER — FLUTICASONE FUROATE-VILANTEROL 200-25 MCG/INH IN AEPB
1.0000 | INHALATION_SPRAY | Freq: Every day | RESPIRATORY_TRACT | Status: DC
  Administered 2019-03-08 – 2019-03-11 (×4): 1 via RESPIRATORY_TRACT
  Filled 2019-03-07: qty 28

## 2019-03-07 MED ORDER — DIPHENHYDRAMINE HCL 25 MG PO CAPS: 50.0000 mg | ORAL_CAPSULE | Freq: Once | ORAL | Status: AC

## 2019-03-07 MED ORDER — MAGNESIUM HYDROXIDE 400 MG/5ML PO SUSP: 30.0000 mL | Freq: Every day | ORAL | Status: DC | PRN

## 2019-03-07 NOTE — BH Assessment (Signed)
Patient has been accepted to [facility]  Accepted by Psych Nurse Practitioner, Marvia Pickles, on the behalf of Dr. Weber Cooks.  Attending Physician will be Dr. Weber Cooks.  Patient has been assigned to room 323, by [place] Charge Nurse Demetria.  Call report to [phone].  Representative/Transfer Coordinator is Dispensing optician Patient pre-admitted by [facility] Patient Access Gust Rung.)  [facility] Floor Staff (Rod, SW) made aware of acceptance.  Patient bed will be available anytime after 8:30pm.

## 2019-03-07 NOTE — Discharge Summary (Addendum)
Physician Discharge Summary  [name] [id number] DOB: 1965-09-03 DOA: 03/04/2019  PCP: Ronnald Nian, DO  Admit date: 03/04/2019 Discharge date: 03/07/2019   Code Status: Full Code  Admitted From: Home Discharged to: [facility]:no  Equipment/Devices:no  Discharge Condition:improving  Recommendations for Outpatient Follow-up   - Follow up with psychiatry and psychotherapy - Discuss diabetes management with PCP - Close follow up outpatient for depression management - Follow up with pulmonology for COPD management - Workup for chronic leukocytosis  Hospital Summary  This is a 53 year old female with past medical history of COPD not on home oxygen, OSA on CPAP, type 2 diabetes, obesity, anxiety and depression with suicidal attempts in the past who presented via EMS on 03/04/2019 for suicide attempt after taking 15-20 Xanax unknown dosage at home.  Patient was alert when EMS initially evaluated but then lost consciousness.  She was intubated in the ED and admitted to the ICU.  Covid negative.  Extubated successfully 11/11 and placed on one-to-one.  Patient transferred out of ICU and to [facility] hospital[facility] care on 11/12. Psychiatry consulted who recommended inpatient psychiatric admission at discharge.  A & P   Principal Problem:   Benzodiazepine overdose Active Problems:   Major depressive disorder, recurrent severe without psychotic features ([place])   Acute toxic encephalopathy secondary to Intentional benzodiazepine overdose in setting of prior suicide attempts intubated 11/10, extubated 11/11 successfully.  On one-to-one for suicide precautions during hospitalization.  - Psychiatry consulted who recommended inpatient psychiatry admission once medically cleared and to continue Cymbalta  COPD Follows with [facility] Pulmonology. Hemodynamically stable on room air. On Advair previously discontinued and started on Dulera in  2014. Was on DuoNeb every 4 hours as needed, Breo Ellipta and spiriva while inpatient - Discharge on home meds, recommend outpatient follow up  OSA on CPAP -CPAP at night  Poorly controlled diabetes HA1C 8.2. Controlled on weight based insulin and canagliflozin while hospitalized.  - Discharge on home meds with outpatient medical optimization recommended -Carb modified diet  Anxiety and depression -Continue Cymbalta -Psychiatric hospitalization at discharge -Recommend close outpatient follow up  Hypertension stable -Continue home losartan  Chronic leukocytosis - Outpatient workup  CAD The 10-year ASCVD risk: 9.4%. Follows with Dr. Tamala Julian, Cardiology -Continue home Lipitor 80 mg -Continue aspirin    Consultants  . PCCM . Psychiatry  Procedures  . Intubated 11/10, Extubate 11/11  Antibiotics  none   Subjective  Patient seen and examined at bedside no acute distress and resting comfortably.  No events overnight.  Tolerating diet. In good spirits and anticipating discharge.   Denies any chest pain, shortness of breath, fever, nausea, vomiting, urinary or bowel complaints. Otherwise ROS negative   Objective   Discharge Exam: Vitals:   03/07/19 0610 03/07/19 1147  BP: (!) 101/55 (!) 112/96  Pulse: 83 84  Resp: 16 18  Temp: 97.7 F (36.5 C) 98.7 F (37.1 C)  SpO2: 96% 95%   Vitals:   03/06/19 2035 03/07/19 0500 03/07/19 0610 03/07/19 1147  BP: (!) 116/57  (!) 101/55 (!) 112/96  Pulse: 93  83 84  Resp: 16  16 18   Temp: 98.6 F (37 C)  97.7 F (36.5 C) 98.7 F (37.1 C)  TempSrc: Oral  Oral Oral  SpO2: 96%  96% 95%  Weight:  120.1 kg    Height:        Physical Exam Vitals signs and nursing note reviewed.  Constitutional:      Appearance: She  is obese.  HENT:     Head: Normocephalic and atraumatic.     Nose: Nose normal.     Mouth/Throat:     Mouth: Mucous membranes are moist.  Eyes:     Extraocular Movements: Extraocular movements intact.   Neck:     Musculoskeletal: Normal range of motion. No neck rigidity.  Cardiovascular:     Rate and Rhythm: Normal rate and regular rhythm.  Pulmonary:     Effort: Pulmonary effort is normal.     Breath sounds: Wheezing present.  Abdominal:     General: Abdomen is flat.     Palpations: Abdomen is soft.  Musculoskeletal: Normal range of motion.        General: No swelling.  Neurological:     General: No focal deficit present.     Mental Status: She is alert. Mental status is at baseline.  Psychiatric:        Mood and Affect: Mood is depressed. Affect is tearful.       The results of significant diagnostics from this hospitalization (including imaging, microbiology, ancillary and laboratory) are listed below for reference.     Microbiology: Recent Results (from the past 240 hour(s))  SARS CORONAVIRUS 2 (TAT 6-24 HRS) Nasopharyngeal Nasopharyngeal Swab     Status: None   Collection Time: 03/04/19 11:58 PM   Specimen: Nasopharyngeal Swab  Result Value Ref Range Status   SARS Coronavirus 2 NEGATIVE NEGATIVE Final    Comment: (NOTE) SARS-CoV-2 target nucleic acids are NOT DETECTED. The SARS-CoV-2 RNA is generally detectable in upper and lower respiratory specimens during the acute phase of infection. Negative results do not preclude SARS-CoV-2 infection, do not rule out co-infections with other pathogens, and should not be used as the sole basis for treatment or other patient management decisions. Negative results must be combined with clinical observations, patient history, and epidemiological information. The expected result is Negative. Fact Sheet for Patients: SugarRoll.be Fact Sheet for Healthcare Providers: https://www.woods-mathews.com/ This test is not yet approved or cleared by the [place] FDA and  has been authorized for detection and/or diagnosis of SARS-CoV-2 by FDA under an Emergency Use Authorization (EUA). This  EUA will remain  in effect (meaning this test can be used) for the duration of the COVID-19 declaration under Section 56 4(b)(1) of the Act, 21 U.S.C. section 360bbb-3(b)(1), unless the authorization is terminated or revoked sooner. Performed at [facility], [place] [address], [place], [place] [address]   Culture, blood (routine x 2)     Status: None (Preliminary result)   Collection Time: 03/05/19  4:08 AM   Specimen: BLOOD  Result Value Ref Range Status   Specimen Description   Final    BLOOD LEFT HAND Performed at [facility] [address] [place], [place] [address]    Special Requests   Final    BOTTLES DRAWN AEROBIC AND ANAEROBIC Blood Culture adequate volume Performed at [facility] [address], [place], [place] [address]    Culture   Final    NO GROWTH 2 DAYS Performed at [facility] [address] [place], [place] [address]    Report Status PENDING  Incomplete  SARS Coronavirus 2 by RT PCR (hospital order, performed in [facility] hospital lab) Nasopharyngeal Nasopharyngeal Swab     Status: None   Collection Time: 03/05/19  8:53 AM   Specimen: Nasopharyngeal Swab  Result Value Ref Range Status   SARS Coronavirus 2 NEGATIVE NEGATIVE  Final    Comment: (NOTE) If result is NEGATIVE SARS-CoV-2 target nucleic acids are NOT DETECTED. The SARS-CoV-2 RNA is generally detectable in upper and lower  respiratory specimens during the acute phase of infection. The lowest  concentration of SARS-CoV-2 viral copies this assay can detect is 250  copies / mL. A negative result does not preclude SARS-CoV-2 infection  and should not be used as the sole basis for treatment or other  patient management decisions.  A negative result may occur with  improper specimen collection / handling, submission of specimen other  than nasopharyngeal swab, presence of viral mutation(s) within the  areas targeted by this assay, and inadequate  number of viral copies  (<250 copies / mL). A negative result must be combined with clinical  observations, patient history, and epidemiological information. If result is POSITIVE SARS-CoV-2 target nucleic acids are DETECTED. The SARS-CoV-2 RNA is generally detectable in upper and lower  respiratory specimens dur ing the acute phase of infection.  Positive  results are indicative of active infection with SARS-CoV-2.  Clinical  correlation with patient history and other diagnostic information is  necessary to determine patient infection status.  Positive results do  not rule out bacterial infection or co-infection with other viruses. If result is PRESUMPTIVE POSTIVE SARS-CoV-2 nucleic acids MAY BE PRESENT.   A presumptive positive result was obtained on the submitted specimen  and confirmed on repeat testing.  While 2019 novel coronavirus  (SARS-CoV-2) nucleic acids may be present in the submitted sample  additional confirmatory testing may be necessary for epidemiological  and / or clinical management purposes  to differentiate between  SARS-CoV-2 and other Sarbecovirus currently known to infect humans.  If clinically indicated additional testing with an alternate test  methodology [phone]) is advised. The SARS-CoV-2 RNA is generally  detectable in upper and lower respiratory sp ecimens during the acute  phase of infection. The expected result is Negative. Fact Sheet for Patients:  StrictlyIdeas.no Fact Sheet for Healthcare Providers: BankingDealers.co.za This test is not yet approved or cleared by the [place] FDA and has been authorized for detection and/or diagnosis of SARS-CoV-2 by FDA under an Emergency Use Authorization (EUA).  This EUA will remain in effect (meaning this test can be used) for the duration of the COVID-19 declaration under Section 564(b)(1) of the Act, 21 U.S.C. section 360bbb-3(b)(1), unless the  authorization is terminated or revoked sooner. Performed at [facility], [place] [address] [place], [place] [address]   MRSA PCR Screening     Status: None   Collection Time: 03/05/19  9:53 AM   Specimen: Nasopharyngeal  Result Value Ref Range Status   MRSA by PCR NEGATIVE NEGATIVE Final    Comment:        The GeneXpert MRSA Assay (FDA approved for NASAL specimens only), is one component of a comprehensive MRSA colonization surveillance program. It is not intended to diagnose MRSA infection nor to guide or monitor treatment for MRSA infections. Performed at [facility], [place] [address], [place], [place] [address]      Labs: BNP (last 3 results) No results for input(s): BNP in the last 8760 hours. Basic Metabolic Panel: Recent Labs  Lab 03/04/19 2333 03/05/19 0440 03/06/19 0802 03/07/19 0603  NA 141 138 139 136  K 3.6 3.7 3.7 3.6  CL 103 101 103 102  CO2 29 28 28 24   GLUCOSE 166* 210* 163* 163*  BUN 8 9 9 12   CREATININE 0.59 0.73 0.58  0.59  CALCIUM 9.1 8.9 8.8* 8.7*  MG  --  2.0 1.9 1.7  PHOS  --  3.6  --   --    Liver Function Tests: Recent Labs  Lab 03/04/19 2333 03/05/19 0440  AST 18 20  ALT 22 23  ALKPHOS 68 61  BILITOT 0.5 0.6  PROT 7.8 6.9  ALBUMIN 4.1 3.5   No results for input(s): LIPASE, AMYLASE in the last 168 hours. No results for input(s): AMMONIA in the last 168 hours. CBC: Recent Labs  Lab 03/04/19 2333 03/05/19 0440 03/06/19 0802 03/07/19 0603 03/07/19 0607  WBC 15.3* 21.8* 12.9* 12.1*  --   NEUTROABS  --  17.4*  --   --  7.8*  HGB 15.3* 14.1 13.9 13.3  --   HCT 50.7* 47.6* 47.1* 44.6  --   MCV 84.9 85.9 85.8 84.6  --   PLT 250 248 218 216  --    Cardiac Enzymes: No results for input(s): CKTOTAL, CKMB, CKMBINDEX, TROPONINI in the last 168 hours. BNP: Invalid input(s): POCBNP CBG: Recent Labs  Lab 03/06/19 2155 03/07/19 0014 03/07/19 0416 03/07/19 0749 03/07/19 1144  GLUCAP 224*  185* 167* 179* 167*   D-Dimer No results for input(s): DDIMER in the last 72 hours. Hgb A1c Recent Labs    03/05/19 0440  HGBA1C 8.2*   Lipid Profile Recent Labs    03/06/19 0516  TRIG 89   Thyroid function studies No results for input(s): TSH, T4TOTAL, T3FREE, THYROIDAB in the last 72 hours.  Invalid input(s): FREET3 Anemia work up No results for input(s): VITAMINB12, FOLATE, FERRITIN, TIBC, IRON, RETICCTPCT in the last 72 hours. Urinalysis    Component Value Date/Time   COLORURINE YELLOW 09/14/2017 1915   APPEARANCEUR HAZY (A) 09/14/2017 1915   LABSPEC 1.015 09/14/2017 1915   PHURINE 6.0 09/14/2017 1915   GLUCOSEU >=500 (A) 09/14/2017 1915   HGBUR SMALL (A) 09/14/2017 1915   BILIRUBINUR NEGATIVE 09/14/2017 1915   KETONESUR NEGATIVE 09/14/2017 1915   PROTEINUR NEGATIVE 09/14/2017 1915   UROBILINOGEN 0.2 09/12/2014 0326   NITRITE NEGATIVE 09/14/2017 1915   LEUKOCYTESUR NEGATIVE 09/14/2017 1915   Sepsis Labs Invalid input(s): PROCALCITONIN,  WBC,  LACTICIDVEN Microbiology Recent Results (from the past 240 hour(s))  SARS CORONAVIRUS 2 (TAT 6-24 HRS) Nasopharyngeal Nasopharyngeal Swab     Status: None   Collection Time: 03/04/19 11:58 PM   Specimen: Nasopharyngeal Swab  Result Value Ref Range Status   SARS Coronavirus 2 NEGATIVE NEGATIVE Final    Comment: (NOTE) SARS-CoV-2 target nucleic acids are NOT DETECTED. The SARS-CoV-2 RNA is generally detectable in upper and lower respiratory specimens during the acute phase of infection. Negative results do not preclude SARS-CoV-2 infection, do not rule out co-infections with other pathogens, and should not be used as the sole basis for treatment or other patient management decisions. Negative results must be combined with clinical observations, patient history, and epidemiological information. The expected result is Negative. Fact Sheet for Patients: SugarRoll.be Fact Sheet for Healthcare  Providers: https://www.woods-mathews.com/ This test is not yet approved or cleared by the [place] FDA and  has been authorized for detection and/or diagnosis of SARS-CoV-2 by FDA under an Emergency Use Authorization (EUA). This EUA will remain  in effect (meaning this test can be used) for the duration of the COVID-19 declaration under Section 56 4(b)(1) of the Act, 21 U.S.C. section 360bbb-3(b)(1), unless the authorization is terminated or revoked sooner. Performed at [facility], [place] [address] [place], [place]  [address]   Culture, blood (routine x 2)     Status: None (Preliminary result)   Collection Time: 03/05/19  4:08 AM   Specimen: BLOOD  Result Value Ref Range Status   Specimen Description   Final    BLOOD LEFT HAND Performed at [facility] [address], [place], [place] [address]    Special Requests   Final    BOTTLES DRAWN AEROBIC AND ANAEROBIC Blood Culture adequate volume Performed at [facility] [address] [place], [place] [address]    Culture   Final    NO GROWTH 2 DAYS Performed at [facility] [address], [place], [place] [address]    Report Status PENDING  Incomplete  SARS Coronavirus 2 by RT PCR (hospital order, performed in [facility] hospital lab) Nasopharyngeal Nasopharyngeal Swab     Status: None   Collection Time: 03/05/19  8:53 AM   Specimen: Nasopharyngeal Swab  Result Value Ref Range Status   SARS Coronavirus 2 NEGATIVE NEGATIVE Final    Comment: (NOTE) If result is NEGATIVE SARS-CoV-2 target nucleic acids are NOT DETECTED. The SARS-CoV-2 RNA is generally detectable in upper and lower  respiratory specimens during the acute phase of infection. The lowest  concentration of SARS-CoV-2 viral copies this assay can detect is 250  copies / mL. A negative result does not preclude SARS-CoV-2 infection  and should not be used as the sole basis for treatment or other   patient management decisions.  A negative result may occur with  improper specimen collection / handling, submission of specimen other  than nasopharyngeal swab, presence of viral mutation(s) within the  areas targeted by this assay, and inadequate number of viral copies  (<250 copies / mL). A negative result must be combined with clinical  observations, patient history, and epidemiological information. If result is POSITIVE SARS-CoV-2 target nucleic acids are DETECTED. The SARS-CoV-2 RNA is generally detectable in upper and lower  respiratory specimens dur ing the acute phase of infection.  Positive  results are indicative of active infection with SARS-CoV-2.  Clinical  correlation with patient history and other diagnostic information is  necessary to determine patient infection status.  Positive results do  not rule out bacterial infection or co-infection with other viruses. If result is PRESUMPTIVE POSTIVE SARS-CoV-2 nucleic acids MAY BE PRESENT.   A presumptive positive result was obtained on the submitted specimen  and confirmed on repeat testing.  While 2019 novel coronavirus  (SARS-CoV-2) nucleic acids may be present in the submitted sample  additional confirmatory testing may be necessary for epidemiological  and / or clinical management purposes  to differentiate between  SARS-CoV-2 and other Sarbecovirus currently known to infect humans.  If clinically indicated additional testing with an alternate test  methodology [phone]) is advised. The SARS-CoV-2 RNA is generally  detectable in upper and lower respiratory sp ecimens during the acute  phase of infection. The expected result is Negative. Fact Sheet for Patients:  StrictlyIdeas.no Fact Sheet for Healthcare Providers: BankingDealers.co.za This test is not yet approved or cleared by the [place] FDA and has been authorized for detection and/or diagnosis of SARS-CoV-2  by FDA under an Emergency Use Authorization (EUA).  This EUA will remain in effect (meaning this test can be used) for the duration of the COVID-19 declaration under Section 564(b)(1) of the Act, 21 U.S.C. section 360bbb-3(b)(1), unless the authorization is terminated or revoked sooner. Performed at [facility], [place]  [address] [place], [place] [address]   MRSA PCR Screening     Status: None   Collection Time: 03/05/19  9:53 AM   Specimen: Nasopharyngeal  Result Value Ref Range Status   MRSA by PCR NEGATIVE NEGATIVE Final    Comment:        The GeneXpert MRSA Assay (FDA approved for NASAL specimens only), is one component of a comprehensive MRSA colonization surveillance program. It is not intended to diagnose MRSA infection nor to guide or monitor treatment for MRSA infections. Performed at [facility], [place] [address] [place], [place] [address]     Discharge Instructions     Discharge Instructions    Diet - low sodium heart healthy   Complete by: As directed    Discharge instructions   Complete by: As directed    You were seen and examined in the hospital for an intentional benzodiazepine overdose and cared for by a critical care physician, psychiatrist and hospitalist.   Upon Discharge:  - stop taking xanax unless otherwise discussed with a psychiatrist - please discuss your depression with a therapist - discuss your diabetes with your primary care physician Make an appointment with your primary care physician within 7 days Bring all home medications to your appointment to review Request that your primary physician go over all hospital tests and procedures/radiological results at the follow up.   Please get all hospital records sent to your physician by signing a hospital release before you go home.  Read the complete instructions along with all the possible side effects for all the medicines you take and that have been  prescribed to you. Take any new medicines after you have completely understood and accept all the possible adverse reactions/side effects.   If you have any questions about your discharge medications or the care you received while you were in the hospital, you can call the unit and asked to speak with the hospitalist on call. Once you are discharged, your primary care physician will handle any further medical issues. Please note that NO REFILLS for any discharge medications will be authorized, as it is imperative that you return to your primary care physician (or establish a relationship with a primary care physician if you do not have one) for your aftercare needs so that they can reassess your need for medications and monitor your lab values.   Do not drive, operate heavy machinery, perform activities at heights, swimming or participation in water activities or provide baby sitting services if your were admitted for loss of consciousness/seizures or if you are on sedating medications including, but not limited to benzodiazepines, sleep medications, narcotic pain medications, etc., until you have been cleared to do so by a medical doctor.   Do not take more than prescribed medications.   Wear a seat belt while driving.  If you have smoked or chewed Tobacco in the last 2 years please stop smoking; also stop any regular Alcohol and/or any Recreational drug use including marijuana.  If you experience worsening of your admission symptoms or develop shortness of breath, chest pain, suicidal or homicidal thoughts or experience a life threatening emergency, you must seek medical attention immediately by calling 911 or calling your PCP immediately.   Increase activity slowly   Complete by: As directed      Allergies as of 03/07/2019      Reactions   Codeine Itching, Nausea Only   Metformin And Related Diarrhea, Other (See Comments)   Stomach pain/nausea  Wellbutrin [bupropion Hcl] Hives    Acyclovir And Related Rash      Medication List    STOP taking these medications   ALPRAZolam 0.5 MG tablet Commonly known as: Xanax   doxycycline 100 MG capsule Commonly known as: VIBRAMYCIN   predniSONE 20 MG tablet Commonly known as: DELTASONE     TAKE these medications   acetaminophen 500 MG tablet Commonly known as: TYLENOL Take 1,000 mg by mouth every 6 (six) hours as needed for mild pain or headache.   albuterol (2.5 MG/3ML) 0.083% nebulizer solution Commonly known as: PROVENTIL Take 3 mLs (2.5 mg total) by nebulization every 6 (six) hours as needed for wheezing or shortness of breath.   amphetamine-dextroamphetamine 30 MG tablet Commonly known as: Adderall Take 1 tablet by mouth 2 (two) times daily.   aspirin EC 81 MG tablet Take 1 tablet (81 mg total) by mouth daily.   atorvastatin 80 MG tablet Commonly known as: LIPITOR Take 1 tablet (80 mg total) by mouth daily.   BD Pen Needle Nano U/F 32G X 4 MM Misc Generic drug: Insulin Pen Needle USE AS DIRECTED WITH VICTOZA   canagliflozin 300 MG Tabs tablet Commonly known as: Invokana Take 1 tablet (300 mg total) by mouth daily before breakfast.   dicyclomine 10 MG capsule Commonly known as: BENTYL Take 1 capsule (10 mg total) by mouth every 8 (eight) hours as needed for spasms.   DULoxetine 60 MG capsule Commonly known as: CYMBALTA TAKE 1 CAPSULE(60 MG) BY MOUTH AT BEDTIME What changed:   how much to take  how to take this  when to take this   fluticasone 50 MCG/ACT nasal spray Commonly known as: FLONASE USE 1 SPRAY IN EACH NOSTRIL TWICE DAILY AFTER SINUS RINSES What changed:   how much to take  how to take this  when to take this   Fluticasone-Salmeterol 250-50 MCG/DOSE Aepb Commonly known as: Advair Diskus Inhale 1 puff into the lungs 2 (two) times daily. Flush mouth after each use- NO RF's NEEDS OV   ibuprofen 800 MG tablet Commonly known as: ADVIL Take 1 tablet (800 mg total) by mouth  every 8 (eight) hours as needed. What changed: reasons to take this   isosorbide mononitrate 30 MG 24 hr tablet Commonly known as: IMDUR TAKE 1 TABLET BY MOUTH EVERY DAY   losartan 25 MG tablet Commonly known as: COZAAR Take 1 tablet (25 mg total) by mouth daily.   meclizine 25 MG tablet Commonly known as: ANTIVERT Take 1 tablet (25 mg total) by mouth 3 (three) times daily as needed for dizziness or nausea.   nicotine 21 mg/24hr patch Commonly known as: NICODERM CQ - dosed in mg/24 hours Place 1 patch (21 mg total) onto the skin daily. Start taking on: March 08, 2019   pantoprazole 40 MG tablet Commonly known as: Protonix Take 1 tablet (40 mg total) by mouth daily. 30 minutes before a meal.   polyvinyl alcohol 1.4 % ophthalmic solution Commonly known as: LIQUIFILM TEARS Place 1 drop into both eyes as needed for dry eyes.   pregabalin 150 MG capsule Commonly known as: LYRICA Take 1 capsule (150 mg total) by mouth 2 (two) times daily. No ReFills   Victoza 18 MG/3ML Sopn Generic drug: liraglutide INJECT 0.3 MLS (1.8 MG TOTAL) INTO THE SKIN DAILY. What changed:   how much to take  how to take this  when to take this   Vitamin D (Ergocalciferol) 1.25 MG (50000 UT) Caps capsule  Commonly known as: DRISDOL Take 1 capsule (50,000 Units total) by mouth every 7 (seven) days.       Allergies  Allergen Reactions  . Codeine Itching and Nausea Only  . Metformin And Related Diarrhea and Other (See Comments)    Stomach pain/nausea  . Wellbutrin [Bupropion Hcl] Hives  . Acyclovir And Related Rash    Time coordinating discharge: Over 30 minutes   SIGNED:   Harold Hedge, D.O. [facility] Pager: [phone]  03/07/2019, 1:59 PM

## 2019-03-07 NOTE — Clinical Social Work Note (Signed)
Met with patient to confirm that she she was still voluntarily wanting to get into a behavioral health hospital.  She confirmed this.  I explained the process of referral to her.  She is hoping for a local hospital to take her.  Got confirmation from [place] at [facility] that they have a bed for here this evening.  Got her to sign voluntary admission ppaperwork, and faxed it over.  Call back from [place] with details.  See his note. Set transportation up for 7:45 PM. TOC sign off.

## 2019-03-08 ENCOUNTER — Other Ambulatory Visit: Payer: Self-pay

## 2019-03-08 LAB — GLUCOSE, CAPILLARY
Glucose-Capillary: 136 mg/dL — ABNORMAL HIGH (ref 70–99)
Glucose-Capillary: 139 mg/dL — ABNORMAL HIGH (ref 70–99)
Glucose-Capillary: 131 mg/dL — ABNORMAL HIGH (ref 70–99)
Glucose-Capillary: 149 mg/dL — ABNORMAL HIGH (ref 70–99)

## 2019-03-08 MED ORDER — ASPIRIN EC 81 MG PO TBEC
81.0000 mg | DELAYED_RELEASE_TABLET | Freq: Every day | ORAL | Status: DC
  Administered 2019-03-08 – 2019-03-11 (×4): 81 mg via ORAL
  Filled 2019-03-08 (×5): qty 1

## 2019-03-08 MED ORDER — ALBUTEROL SULFATE HFA 108 (90 BASE) MCG/ACT IN AERS
1.0000 | INHALATION_SPRAY | Freq: Four times a day (QID) | RESPIRATORY_TRACT | Status: DC | PRN
  Administered 2019-03-10: 15:00:00 2 via RESPIRATORY_TRACT
  Filled 2019-03-08: qty 6.7

## 2019-03-08 MED ORDER — FAMOTIDINE 20 MG PO TABS
20.0000 mg | ORAL_TABLET | Freq: Two times a day (BID) | ORAL | Status: DC
  Administered 2019-03-08 – 2019-03-11 (×6): 20 mg via ORAL
  Filled 2019-03-08 (×6): qty 1

## 2019-03-08 MED ORDER — TRAZODONE HCL 50 MG PO TABS: 50.0000 mg | ORAL_TABLET | Freq: Every evening | ORAL | Status: DC | PRN

## 2019-03-08 MED ORDER — INFLUENZA VAC SPLIT QUAD 0.5 ML IM SUSY: 0.5000 mL | PREFILLED_SYRINGE | INTRAMUSCULAR | Status: DC

## 2019-03-08 MED ORDER — LORAZEPAM 1 MG PO TABS: 1.0000 mg | ORAL_TABLET | Freq: Four times a day (QID) | ORAL | Status: DC | PRN

## 2019-03-08 MED ORDER — HYDROXYZINE HCL 25 MG PO TABS: 25.0000 mg | ORAL_TABLET | Freq: Three times a day (TID) | ORAL | Status: DC | PRN

## 2019-03-08 MED ORDER — PNEUMOCOCCAL VAC POLYVALENT 25 MCG/0.5ML IJ INJ
0.5000 mL | INJECTION | INTRAMUSCULAR | Status: DC
  Filled 2019-03-08: qty 0.5

## 2019-03-08 MED ORDER — LORATADINE 10 MG PO TABS
10.0000 mg | ORAL_TABLET | Freq: Every day | ORAL | Status: DC
  Administered 2019-03-08 – 2019-03-11 (×4): 10 mg via ORAL
  Filled 2019-03-08 (×4): qty 1

## 2019-03-08 NOTE — Progress Notes (Signed)
D: Patient was admitted after impulsively overdosing on xanax following an argument she had with her stepdaughter. Patient has a history of depression and previous suicide attempts with multiple prior hospitalizations. Denies SI currently and contracts for safety. Denies HI and AVH. Denies substance abuse. States that due to COVID and evictions she has her children and her husband of one year's children and grandchildren asking for help and some of these family members are living with her and they need a bigger place to live. She says there is conflict with stepdaughter because she thinks patient is manipulating her father to give more help to the patient's children than her. History of COPD, DM, and sleep apnea. Skin search done and patient has reddened itchy areas on her chest from EKG electrodes and she thinks she may be allergic to the adhesive. Dr. Mallie Darting called and Benadryl 50 mg IM ordered and given A: Continue to monitor for safety.  R: Safety maintained.

## 2019-03-08 NOTE — Plan of Care (Addendum)
Pt rates depression 4/10, anxiety 5/10 and hopelessness 5/10. Pt denies SI, HI and AVH. Pt was educated on care plan and verbalizes understanding. Collier Bullock RN Problem: Education: Goal: Knowledge of [place] Education information/materials will improve Outcome: Progressing Goal: Emotional status will improve Outcome: Progressing Goal: Mental status will improve Outcome: Progressing Goal: Verbalization of understanding the information provided will improve Outcome: Progressing   Problem: Activity: Goal: Interest or engagement in activities will improve Outcome: Progressing Goal: Sleeping patterns will improve Outcome: Progressing   Problem: Coping: Goal: Ability to verbalize frustrations and anger appropriately will improve Outcome: Progressing Goal: Ability to demonstrate self-control will improve Outcome: Progressing   Problem: Health Behavior/Discharge Planning: Goal: Identification of resources available to assist in meeting health care needs will improve Outcome: Progressing Goal: Compliance with treatment plan for underlying cause of condition will improve Outcome: Progressing   Problem: Physical Regulation: Goal: Ability to maintain clinical measurements within normal limits will improve Outcome: Progressing   Problem: Safety: Goal: Periods of time without injury will increase Outcome: Progressing   Problem: Education: Goal: Utilization of techniques to improve thought processes will improve Outcome: Progressing Goal: Knowledge of the prescribed therapeutic regimen will improve Outcome: Progressing   Problem: Activity: Goal: Interest or engagement in leisure activities will improve Outcome: Progressing Goal: Imbalance in normal sleep/wake cycle will improve Outcome: Progressing   Problem: Coping: Goal: Coping ability will improve Outcome: Progressing Goal: Will verbalize feelings Outcome: Progressing   Problem: Health Behavior/Discharge  Planning: Goal: Ability to make decisions will improve Outcome: Progressing Goal: Compliance with therapeutic regimen will improve Outcome: Progressing   Problem: Role Relationship: Goal: Will demonstrate positive changes in social behaviors and relationships Outcome: Progressing   Problem: Safety: Goal: Ability to disclose and discuss suicidal ideas will improve Outcome: Progressing Goal: Ability to identify and utilize support systems that promote safety will improve Outcome: Progressing

## 2019-03-08 NOTE — Tx Team (Signed)
Initial Treatment Plan 03/08/2019 2:22 AM [name] [id number]    PATIENT STRESSORS: Marital or family conflict   PATIENT STRENGTHS: Ability for insight Capable of independent living Curator fund of knowledge   PATIENT IDENTIFIED PROBLEMS:       anxiety  suicidal  depression           DISCHARGE CRITERIA:  Ability to meet basic life and health needs Adequate post-discharge living arrangements Improved stabilization in mood, thinking, and/or behavior Medical problems require only outpatient monitoring Motivation to continue treatment in a less acute level of care Need for constant or close observation no longer present Reduction of life-threatening or endangering symptoms to within safe limits Safe-care adequate arrangements made Verbal commitment to aftercare and medication compliance  PRELIMINARY DISCHARGE PLAN: Outpatient therapy Return to previous living arrangement  PATIENT/FAMILY INVOLVEMENT: This treatment plan has been presented to and reviewed with the patient, [name], and/or family member.  The patient and family have been given the opportunity to ask questions and make suggestions.  Libby Maw, RN 03/08/2019, 2:22 AM

## 2019-03-08 NOTE — Tx Team (Signed)
Interdisciplinary Treatment and Diagnostic Plan Update  03/08/2019 Time of Session: KELEE CUNNINGHAM MRN: [id number]  Principal Diagnosis: <principal problem not specified>  Secondary Diagnoses: Active Problems:   MDD (major depressive disorder), severe (HCC)   Current Medications:  Current Facility-Administered Medications  Medication Dose Route Frequency Provider Last Rate Last Dose  . acetaminophen (TYLENOL) tablet 650 mg  650 mg Oral Q6H PRN Money, Lowry Ram, FNP      . albuterol (VENTOLIN HFA) 108 (90 Base) MCG/ACT inhaler 1-2 puff  1-2 puff Inhalation Q6H PRN Sharma Covert, MD      . alum & mag hydroxide-simeth (MAALOX/MYLANTA) 200-200-20 MG/5ML suspension 30 mL  30 mL Oral Q4H PRN Money, Lowry Ram, FNP      . aspirin EC tablet 81 mg  81 mg Oral Daily Sharma Covert, MD      . atorvastatin (LIPITOR) tablet 80 mg  80 mg Oral [address] Lowry Ram, FNP   80 mg at 03/07/19 2202  . canagliflozin (INVOKANA) tablet 300 mg  300 mg Oral QAC breakfast Money, Darnelle Maffucci B, FNP      . diphenhydrAMINE (BENADRYL) capsule 25 mg  25 mg Oral Q6H PRN Sharma Covert, MD      . DULoxetine (CYMBALTA) DR capsule 60 mg  60 mg Oral Daily Money, Lowry Ram, FNP   60 mg at 03/08/19 0757  . famotidine (PEPCID) tablet 20 mg  20 mg Oral BID Sharma Covert, MD      . fluticasone furoate-vilanterol (BREO ELLIPTA) 200-25 MCG/INH 1 puff  1 puff Inhalation Daily Money, Lowry Ram, FNP   1 puff at 03/08/19 0759  . hydrocortisone cream 1 %   Topical Q8H PRN Sharma Covert, MD      . hydrOXYzine (ATARAX/VISTARIL) tablet 25 mg  25 mg Oral TID PRN Sharma Covert, MD      . Derrill Memo ON 03/09/2019] influenza vac split quadrivalent PF (FLUARIX) injection 0.5 mL  0.5 mL Intramuscular Tomorrow-1000 Clapacs, John T, MD      . insulin aspart (novoLOG) injection 0-15 Units  0-15 Units Subcutaneous TID WC Money, Lowry Ram, FNP   2 Units at 03/08/19 0804  . insulin aspart (novoLOG) injection 0-5 Units   0-5 Units Subcutaneous QHS Money, Lowry Ram, FNP      . insulin glargine (LANTUS) injection 24 Units  24 Units Subcutaneous QHS Money, Lowry Ram, FNP   24 Units at 03/07/19 2226  . loratadine (CLARITIN) tablet 10 mg  10 mg Oral Daily Sharma Covert, MD      . LORazepam (ATIVAN) tablet 1 mg  1 mg Oral Q6H PRN Sharma Covert, MD      . losartan (COZAAR) tablet 25 mg  25 mg Oral Daily Money, Lowry Ram, FNP   25 mg at 03/08/19 0757  . magnesium hydroxide (MILK OF MAGNESIA) suspension 30 mL  30 mL Oral Daily PRN Money, Darnelle Maffucci B, FNP      . nicotine (NICODERM CQ - dosed in mg/24 hours) patch 21 mg  21 mg Transdermal Daily Money, Lowry Ram, FNP   21 mg at 03/08/19 0758  . pantoprazole (PROTONIX) EC tablet 40 mg  40 mg Oral Daily Money, Lowry Ram, FNP   40 mg at 03/08/19 0757  . [START ON 03/09/2019] pneumococcal 23 valent vaccine (PNEUMOVAX-23) injection 0.5 mL  0.5 mL Intramuscular Tomorrow-1000 Clapacs, John T, MD      . pregabalin (LYRICA) capsule 100 mg  100 mg Oral BID  Money, Lowry Ram, FNP   100 mg at 03/08/19 0758  . traZODone (DESYREL) tablet 50 mg  50 mg Oral QHS PRN Sharma Covert, MD      . umeclidinium bromide (INCRUSE ELLIPTA) 62.5 MCG/INH 1 puff  1 puff Inhalation Daily Money, Lowry Ram, FNP   1 puff at 03/08/19 0759   PTA Medications: Facility-Administered Medications Prior to Admission  Medication Dose Route Frequency Provider Last Rate Last Dose  . ipratropium-albuterol (DUONEB) 0.5-2.5 (3) MG/3ML nebulizer solution 3 mL  3 mL Nebulization Q6H Opalski, Deborah, DO   3 mL at 01/10/18 1042   Medications Prior to Admission  Medication Sig Dispense Refill Last Dose  . acetaminophen (TYLENOL) 500 MG tablet Take 1,000 mg by mouth every 6 (six) hours as needed for mild pain or headache.     . albuterol (PROVENTIL) (2.5 MG/3ML) 0.083% nebulizer solution Take 3 mLs (2.5 mg total) by nebulization every 6 (six) hours as needed for wheezing or shortness of breath. 75 mL 12   .  amphetamine-dextroamphetamine (ADDERALL) 30 MG tablet Take 1 tablet by mouth 2 (two) times daily. 60 tablet 0   . aspirin EC 81 MG tablet Take 1 tablet (81 mg total) by mouth daily. 90 tablet 3   . atorvastatin (LIPITOR) 80 MG tablet Take 1 tablet (80 mg total) by mouth daily. 90 tablet 3   . BD PEN NEEDLE NANO U/F 32G X 4 MM MISC USE AS DIRECTED WITH VICTOZA 100 each 1   . canagliflozin (INVOKANA) 300 MG TABS tablet Take 1 tablet (300 mg total) by mouth daily before breakfast. 90 tablet 1   . dicyclomine (BENTYL) 10 MG capsule Take 1 capsule (10 mg total) by mouth every 8 (eight) hours as needed for spasms. (Patient not taking: Reported on 02/21/2019) 30 capsule 3   . DULoxetine (CYMBALTA) 60 MG capsule TAKE 1 CAPSULE(60 MG) BY MOUTH AT BEDTIME (Patient taking differently: Take 60 mg by mouth daily. TAKE 1 CAPSULE(60 MG) BY MOUTH AT BEDTIME) 30 capsule 2   . fluticasone (FLONASE) 50 MCG/ACT nasal spray USE 1 SPRAY IN EACH NOSTRIL TWICE DAILY AFTER SINUS RINSES (Patient taking differently: Place 1 spray into both nostrils daily. USE 1 SPRAY IN EACH NOSTRIL TWICE DAILY AFTER SINUS RINSES) 16 g 2   . Fluticasone-Salmeterol (ADVAIR DISKUS) 250-50 MCG/DOSE AEPB Inhale 1 puff into the lungs 2 (two) times daily. Flush mouth after each use- NO RF's NEEDS OV 1 each 0   . ibuprofen (ADVIL) 800 MG tablet Take 1 tablet (800 mg total) by mouth every 8 (eight) hours as needed. (Patient taking differently: Take 800 mg by mouth every 8 (eight) hours as needed for fever, headache or moderate pain. ) 30 tablet 0   . isosorbide mononitrate (IMDUR) 30 MG 24 hr tablet TAKE 1 TABLET BY MOUTH EVERY DAY (Patient taking differently: Take 30 mg by mouth daily. ) 90 tablet 3   . liraglutide (VICTOZA) 18 MG/3ML SOPN INJECT 0.3 MLS (1.8 MG TOTAL) INTO THE SKIN DAILY. (Patient taking differently: Inject 1.8 mg into the skin daily. INJECT 0.3 MLS (1.8 MG TOTAL) INTO THE SKIN DAILY.) 27 mL 1   . losartan (COZAAR) 25 MG tablet Take 1  tablet (25 mg total) by mouth daily. 90 tablet 3   . meclizine (ANTIVERT) 25 MG tablet Take 1 tablet (25 mg total) by mouth 3 (three) times daily as needed for dizziness or nausea. 30 tablet 0   . nicotine (NICODERM CQ - DOSED IN  MG/24 HOURS) 21 mg/24hr patch Place 1 patch (21 mg total) onto the skin daily. 28 patch 0   . pantoprazole (PROTONIX) 40 MG tablet Take 1 tablet (40 mg total) by mouth daily. 30 minutes before a meal. 90 tablet 3   . polyvinyl alcohol (LIQUIFILM TEARS) 1.4 % ophthalmic solution Place 1 drop into both eyes as needed for dry eyes.     . pregabalin (LYRICA) 150 MG capsule Take 1 capsule (150 mg total) by mouth 2 (two) times daily. No ReFills 180 capsule 1   . Vitamin D, Ergocalciferol, (DRISDOL) 50000 units CAPS capsule Take 1 capsule (50,000 Units total) by mouth every 7 (seven) days. 12 capsule 10     Patient Stressors: Marital or family conflict  Patient Strengths: Ability for insight Capable of independent living Curator fund of knowledge  Treatment Modalities: Medication Management, Group therapy, Case management,  1 to 1 session with clinician, Psychoeducation, Recreational therapy.   Physician Treatment Plan for Primary Diagnosis: <principal problem not specified> Long Term Goal(s):     Short Term Goals:    Medication Management: Evaluate patient's response, side effects, and tolerance of medication regimen.  Therapeutic Interventions: 1 to 1 sessions, Unit Group sessions and Medication administration.  Evaluation of Outcomes: Not Met  Physician Treatment Plan for Secondary Diagnosis: Active Problems:   MDD (major depressive disorder), severe ([place])  Long Term Goal(s):     Short Term Goals:       Medication Management: Evaluate patient's response, side effects, and tolerance of medication regimen.  Therapeutic Interventions: 1 to 1 sessions, Unit Group sessions and Medication administration.  Evaluation of Outcomes: Not  Met   RN Treatment Plan for Primary Diagnosis: <principal problem not specified> Long Term Goal(s): Knowledge of disease and therapeutic regimen to maintain health will improve  Short Term Goals: Ability to participate in decision making will improve, Ability to verbalize feelings will improve, Ability to disclose and discuss suicidal ideas, Ability to identify and develop effective coping behaviors will improve and Compliance with prescribed medications will improve  Medication Management: RN will administer medications as ordered by provider, will assess and evaluate patient's response and provide education to patient for prescribed medication. RN will report any adverse and/or side effects to prescribing provider.  Therapeutic Interventions: 1 on 1 counseling sessions, Psychoeducation, Medication administration, Evaluate responses to treatment, Monitor vital signs and CBGs as ordered, Perform/monitor CIWA, COWS, AIMS and Fall Risk screenings as ordered, Perform wound care treatments as ordered.  Evaluation of Outcomes: Not Met   LCSW Treatment Plan for Primary Diagnosis: <principal problem not specified> Long Term Goal(s): Safe transition to appropriate next level of care at discharge, Engage patient in therapeutic group addressing interpersonal concerns.  Short Term Goals: Engage patient in aftercare planning with referrals and resources  Therapeutic Interventions: Assess for all discharge needs, 1 to 1 time with Social worker, Explore available resources and support systems, Assess for adequacy in community support network, Educate family and significant other(s) on suicide prevention, Complete Psychosocial Assessment, Interpersonal group therapy.  Evaluation of Outcomes: Not Met   Progress in Treatment: Attending groups: No. Participating in groups: No. Taking medication as prescribed: Yes. Toleration medication: Yes. Family/Significant other contact made: No, will contact:  when  pt gives consent Patient understands diagnosis: Yes. Discussing patient identified problems/goals with staff: Yes. Medical problems stabilized or resolved: No. Denies suicidal/homicidal ideation: Yes. Issues/concerns per patient self-inventory: No. Other: NA  New problem(s) identified: No, Describe:  none reported  New Short  Term/Long Term Goal(s):Attend outpatient treatment, take medication as prescribed, develop and implement healthy coping methods  Patient Goals:  "Learn how to cope better with my living situation and go home"  Discharge Plan or Barriers: Pt will return home and follow up with outpatient treatment  Reason for Continuation of Hospitalization: Medication stabilization Suicidal ideation  Estimated Length of Stay:1-7 days  Attendees: Patient:[facility] 03/08/2019 11:15 AM  Physician: Myles Lipps 03/08/2019 11:15 AM  Nursing:  03/08/2019 11:15 AM  RN Care Manager: 03/08/2019 11:15 AM  Social Worker: Sanjuana Kava 03/08/2019 11:15 AM  Recreational Therapist:  03/08/2019 11:15 AM  Other:  03/08/2019 11:15 AM  Other:  03/08/2019 11:15 AM  Other: 03/08/2019 11:15 AM    Scribe for Treatment Team: Yvette Rack, LCSW 03/08/2019 11:15 AM

## 2019-03-08 NOTE — [facility] Group Notes (Signed)
[place] Group Notes:  (Nursing/MHT/Case Management/Adjunct)  Date:  03/08/2019  Time:  9:15 PM  Type of Therapy:  Group Therapy  Participation Level:  Active  Participation Quality:  Sharing  Affect:  Appropriate  Cognitive:  Alert  Insight:  Good  Engagement in Group:  Engaged  Modes of Intervention:  Support  Summary of Progress/Problems:  [name] 03/08/2019, 9:15 PM

## 2019-03-08 NOTE — Progress Notes (Signed)
Pt has been calm and cooperative today. Pt has been mainly withdrawn and sleeping a lot. She says that her husband is going to visit tonight. Collier Bullock RN

## 2019-03-08 NOTE — H&P (Signed)
Psychiatric Admission Assessment Adult  Patient Identification: [name] MRN:  [id number] Date of Evaluation:  03/08/2019 Chief Complaint:  major depression Principal Diagnosis: <principal problem not specified> Diagnosis:  Active Problems:   MDD (major depressive disorder), severe ([place])  History of Present Illness: Patient is seen and examined.  Patient is a 53 year old female with a past psychiatric history significant for major depression, generalized anxiety disorder as well as attention deficit disorder who originally presented to the [facility] emergency department on 03/05/2019 after an intentional overdose of anywhere between 15-20 Xanax.  She was admitted to the hospital and evaluated.  She was evaluated by the consult service at [facility], and decision was made to admit her to the hospital for evaluation and stabilization.  The patient stated that she had been doing well until recently.  She stated that she and her new husband had bought a home in March, and several of their family members had moved in.  After their family members had moved and some boyfriends and girlfriends of those family members moved in.  The agreement was that they would pay rent, and be able to stay in the home.  Unfortunately during the course of time it became increasingly more difficult to collect rent from the boyfriend's etc.  She stated that this increased psychosocial stress from the financial and emotional standpoint became overwhelming for her.  She is followed by Dr. Harrington Challenger in [place], and is prescribed Xanax.  She stated she often will not take the Xanax and that was the reason she had so many tablets available.  Unfortunately this is the second intentional overdose of Xanax that she has in her past.  In 2016 she was admitted to the behavioral health hospital in [place].  It again was secondary to an intentional overdose of Xanax.  Her discharge medications at that time  included Abilify, Cymbalta, Lyrica.  She last saw Dr. Harrington Challenger on 01/13/2019.  She had no complaints at that time.  Her psychiatric medications included Xanax, Adderall, Cymbalta.  She admitted to feeling overwhelmed, worsening depression, and worsening anxiety.  She was admitted to the hospital for evaluation and stabilization.  Associated Signs/Symptoms: Depression Symptoms:  depressed mood, anhedonia, insomnia, psychomotor agitation, fatigue, feelings of worthlessness/guilt, difficulty concentrating, hopelessness, suicidal thoughts without plan, anxiety, loss of energy/fatigue, (Hypo) Manic Symptoms:  Distractibility, Impulsivity, Irritable Mood, Anxiety Symptoms:  Excessive Worry, Psychotic Symptoms:  denied PTSD Symptoms: Negative Total Time spent with patient: 30 minutes  Past Psychiatric History: Patient had 1 previous overdose in 2016 similar to this event.  She is followed by Dr. Harrington Challenger as an outpatient.  She is prescribed Xanax, Adderall, Cymbalta.  She also previously had received Abilify for augmentation of antidepressant treatment.  Is the patient at risk to self? Yes.    Has the patient been a risk to self in the past 6 months? Yes.    Has the patient been a risk to self within the distant past? Yes.    Is the patient a risk to others? No.  Has the patient been a risk to others in the past 6 months? No.  Has the patient been a risk to others within the distant past? No.   Prior Inpatient Therapy:   Prior Outpatient Therapy:    Alcohol Screening: 1. How often do you have a drink containing alcohol?: Never 2. How many drinks containing alcohol do you have on a typical day when you are drinking?: 1 or 2 3. How often  do you have six or more drinks on one occasion?: Never AUDIT-C Score: 0 4. How often during the last year have you found that you were not able to stop drinking once you had started?: Never 5. How often during the last year have you failed to do what was  normally expected from you becasue of drinking?: Never 6. How often during the last year have you needed a first drink in the morning to get yourself going after a heavy drinking session?: Never 7. How often during the last year have you had a feeling of guilt of remorse after drinking?: Never 8. How often during the last year have you been unable to remember what happened the night before because you had been drinking?: Never 9. Have you or someone else been injured as a result of your drinking?: No 10. Has a relative or friend or a doctor or another health worker been concerned about your drinking or suggested you cut down?: No Alcohol Use Disorder Identification Test Final Score (AUDIT): 0 Alcohol Brief Interventions/Follow-up: Brief Advice Substance Abuse History in the last 12 months:  No. Consequences of Substance Abuse: Negative Previous Psychotropic Medications: Yes  Psychological Evaluations: Yes  Past Medical History:  Past Medical History:  Diagnosis Date  . Anemia   . Anxiety   . Asthmatic bronchitis    normal PFT/ seen by pulmonary no evidence of COPD  . Atypical chest pain 10/05/2017  . Chest pain on respiration 03/25/2014  . Chronic bronchitis ([place])   . Chronic lower back pain   . Chronic respiratory failure ([place]) 10/16/2011   Newly 02 dep 24/7 p discharge from [facility] 01/2013  - 03/13/2014  Walked RA  2 laps @ 185 ft each stopped due to  Sob/ aching in legs, thirsty/ no desat @ slow pace   . Chronic respiratory failure with hypoxia (HCC)    On 2-3 L of oxygen at home  . Cigarette smoker 12/02/2010   Followed in Pulmonary clinic/ [facility]/ [place]   - Limits of effective care reviewed 12/22/2011    . COPD (chronic obstructive pulmonary disease) ([place])   . COPD with chronic bronchitis ([place]) 10/01/2017  . Daily headache   . Depression   . Diabetic peripheral neuropathy ([place])   . DM (diabetes mellitus) type II controlled, neurological manifestation ([place]) 12/02/2010  .  Gastric erosions    EGD 08/2010.  [name] GERD (gastroesophageal reflux disease)   . H/O drug abuse ([place]) 11/12/2017   -- scanned document from outside source: [facility] in [place] which showed UDS positive for Adderall /amphetamine usage as well as positive urine for THC.  This test result was collected 08/11/2016 and reported 10/07/2016. - lso review of the chart shows another positive amphetamine, THC and METH in the urine back on 10/18/2015 under "care everywhere". ---So   . Heavy menses   . High cholesterol   . History of blood transfusion    "related to low HgB" (10/05/2017)  . History of hiatal hernia   . HTN (hypertension)   . Hyperlipidemia associated with type 2 diabetes mellitus ([place]) 10/18/2015  . Hypertension associated with diabetes ([place]) 12/02/2010   D/c acei 12/22/2011 due to psuedowheeze and narcotic dependent cough> ? Improved - [id number] started bystolic in place of cozar due to cough    . Increased urinary protein excretion   . Internal hemorrhoids    Colonoscopy 5/12.  . Iron deficiency anemia 10/01/2017  . Mixed diabetic hyperlipidemia  associated with type 2 diabetes mellitus ([place]) 10/01/2017  . On home oxygen therapy    "5L at night" (10/05/2017)  . OSA on CPAP   . Osteoarthritis    "back" (10/05/2017)  . Oxygen dependent 10/16/2011  . Pneumonia    "lots of times" (10/05/2017)  . Poorly controlled diabetes mellitus ([place]) 11/12/2017  . Pulmonary infiltrates 12/22/2011   Followed in Pulmonary clinic/ [facility]/ [place]    - See CT Chest 05/05/11   . Shortness of breath   . Tachycardia    never had test done since no insurance  . Tobacco use disorder-current smoker greater than 40-pack-year history- since age 62 1 ppd 10/01/2017  . Type II diabetes mellitus ([place])     Past Surgical History:  Procedure Laterality Date  . CESAREAN SECTION  1988; 1989  . CHOLECYSTECTOMY OPEN  1990  . COLONOSCOPY  09/16/2010   [id number] colon/small  internal hemorrhoids  . ESOPHAGOGASTRODUODENOSCOPY  09/16/2010   SLF: normal/mild gastritis  . ESOPHAGOGASTRODUODENOSCOPY N/A 10/19/2014   Procedure: ESOPHAGOGASTRODUODENOSCOPY (EGD);  Surgeon: Danie Binder, MD;  Location: [facility] END[facility] SUITE;  Service: Endoscopy;  Laterality: N/A;  830  . FRACTURE SURGERY    . HYSTEROSCOPY WITH THERMACHOICE  01/17/2012   Procedure: HYSTEROSCOPY WITH THERMACHOICE;  Surgeon: Florian Buff, MD;  Location: [facility] ORS;  Service: Gynecology;  Laterality: N/A;  total therapy time: 9:13sec  D5W  18 ml in, D5W   6ml out, temperture 87degrees celcious  . KIDNEY SURGERY     as child for blockages  . TONSILLECTOMY    . TUBAL LIGATION  1989  . TYMPANOSTOMY TUBE PLACEMENT Bilateral    "several times when I was a child"  . uterine ablation    . WRIST FRACTURE SURGERY Left 1995   Family History:  Family History  Problem Relation Age of Onset  . Heart attack Father 28       deceased, etoh use  . Heart disease Father   . Alcohol abuse Father   . Depression Father   . Heart failure Father   . Heart attack Mother 17       deceased  . Diabetes Mother   . Breast cancer Mother   . Heart failure Mother        oxygen dependence, nonsmoker  . Heart disease Mother   . Depression Mother   . Cancer Mother   . Liver disease Maternal Aunt 67       died while on liver transplant list  . Heart attack Maternal Grandmother        premature CAD  . Ulcers Sister   . Hypertension Sister   . Heart failure Sister   . Colon cancer Neg Hx    Family Psychiatric  History: Father with depression and alcohol abuse.  Mother with depression. Tobacco Screening: Have you used any form of tobacco in the last 30 days? (Cigarettes, Smokeless Tobacco, Cigars, and/or Pipes): Yes Tobacco use, Select all that apply: 5 or more cigarettes per day Are you interested in Tobacco Cessation Medications?: Yes, will notify MD for an order Counseled patient on smoking cessation including recognizing danger  situations, developing coping skills and basic information about quitting provided: Yes Social History:  Social History   Substance and Sexual Activity  Alcohol Use Yes   Comment: 10/05/2017 "5 - 8 shots tequilia//month"     Social History   Substance and Sexual Activity  Drug Use Yes  . Types: Marijuana   Comment: 10/05/2017 "a few  times/wk"    Additional Social History:      History of alcohol / drug use?: No history of alcohol / drug abuse                    Allergies:   Allergies  Allergen Reactions  . Codeine Itching and Nausea Only  . Metformin And Related Diarrhea and Other (See Comments)    Stomach pain/nausea  . Wellbutrin [Bupropion Hcl] Hives  . Acyclovir And Related Rash   Lab Results:  Results for orders placed or performed during the hospital encounter of 03/07/19 (from the past 48 hour(s))  Glucose, capillary     Status: Abnormal   Collection Time: 03/07/19 10:13 PM  Result Value Ref Range   Glucose-Capillary 196 (H) 70 - 99 mg/dL  Glucose, capillary     Status: Abnormal   Collection Time: 03/08/19  7:50 AM  Result Value Ref Range   Glucose-Capillary 136 (H) 70 - 99 mg/dL  Glucose, capillary     Status: Abnormal   Collection Time: 03/08/19 11:12 AM  Result Value Ref Range   Glucose-Capillary 139 (H) 70 - 99 mg/dL   Comment 1 Notify RN     Blood Alcohol level:  Lab Results  Component Value Date   ETH <10 03/04/2019   ETH <5 99991111    Metabolic Disorder Labs:  Lab Results  Component Value Date   HGBA1C 8.2 (H) 03/05/2019   MPG 188.64 03/05/2019   MPG 160 (H) 03/10/2015   No results found for: PROLACTIN Lab Results  Component Value Date   CHOL 131 02/12/2018   TRIG 89 03/06/2019   HDL 38 (L) 02/12/2018   CHOLHDL 3.4 02/12/2018   VLDL 35 10/06/2017   LDLCALC 74 02/12/2018   LDLCALC 132 (H) 10/06/2017    Current Medications: Current Facility-Administered Medications  Medication Dose Route Frequency Provider Last Rate Last  Dose  . acetaminophen (TYLENOL) tablet 650 mg  650 mg Oral Q6H PRN Money, Lowry Ram, FNP   650 mg at 03/08/19 1135  . albuterol (VENTOLIN HFA) 108 (90 Base) MCG/ACT inhaler 1-2 puff  1-2 puff Inhalation Q6H PRN Sharma Covert, MD      . alum & mag hydroxide-simeth (MAALOX/MYLANTA) 200-200-20 MG/5ML suspension 30 mL  30 mL Oral Q4H PRN Money, Lowry Ram, FNP      . aspirin EC tablet 81 mg  81 mg Oral Daily Sharma Covert, MD   81 mg at 03/08/19 1135  . atorvastatin (LIPITOR) tablet 80 mg  80 mg Oral q1800 Money, Lowry Ram, FNP   80 mg at 03/07/19 2202  . canagliflozin (INVOKANA) tablet 300 mg  300 mg Oral QAC breakfast Money, Darnelle Maffucci B, FNP      . diphenhydrAMINE (BENADRYL) capsule 25 mg  25 mg Oral Q6H PRN Sharma Covert, MD      . DULoxetine (CYMBALTA) DR capsule 60 mg  60 mg Oral Daily Money, Lowry Ram, FNP   60 mg at 03/08/19 0757  . famotidine (PEPCID) tablet 20 mg  20 mg Oral BID Sharma Covert, MD      . fluticasone furoate-vilanterol (BREO ELLIPTA) 200-25 MCG/INH 1 puff  1 puff Inhalation Daily Money, Lowry Ram, FNP   1 puff at 03/08/19 0759  . hydrocortisone cream 1 %   Topical Q8H PRN Sharma Covert, MD      . hydrOXYzine (ATARAX/VISTARIL) tablet 25 mg  25 mg Oral TID PRN Sharma Covert, MD   25  mg at 03/08/19 1321  . [START ON 03/09/2019] influenza vac split quadrivalent PF (FLUARIX) injection 0.5 mL  0.5 mL Intramuscular Tomorrow-1000 Clapacs, John T, MD      . insulin aspart (novoLOG) injection 0-15 Units  0-15 Units Subcutaneous TID WC Money, Lowry Ram, FNP   2 Units at 03/08/19 1136  . insulin aspart (novoLOG) injection 0-5 Units  0-5 Units Subcutaneous QHS Money, Lowry Ram, FNP      . insulin glargine (LANTUS) injection 24 Units  24 Units Subcutaneous QHS Money, Lowry Ram, FNP   24 Units at 03/07/19 2226  . loratadine (CLARITIN) tablet 10 mg  10 mg Oral Daily Sharma Covert, MD   10 mg at 03/08/19 1135  . LORazepam (ATIVAN) tablet 1 mg  1 mg Oral Q6H PRN Sharma Covert, MD      . losartan (COZAAR) tablet 25 mg  25 mg Oral Daily Money, Lowry Ram, FNP   25 mg at 03/08/19 0757  . magnesium hydroxide (MILK OF MAGNESIA) suspension 30 mL  30 mL Oral Daily PRN Money, Darnelle Maffucci B, FNP      . nicotine (NICODERM CQ - dosed in mg/24 hours) patch 21 mg  21 mg Transdermal Daily Money, Lowry Ram, FNP   21 mg at 03/08/19 0758  . pantoprazole (PROTONIX) EC tablet 40 mg  40 mg Oral Daily Money, Lowry Ram, FNP   40 mg at 03/08/19 0757  . [START ON 03/09/2019] pneumococcal 23 valent vaccine (PNEUMOVAX-23) injection 0.5 mL  0.5 mL Intramuscular Tomorrow-1000 Clapacs, John T, MD      . pregabalin (LYRICA) capsule 100 mg  100 mg Oral BID Money, Darnelle Maffucci B, FNP   100 mg at 03/08/19 0758  . traZODone (DESYREL) tablet 50 mg  50 mg Oral QHS PRN Sharma Covert, MD      . umeclidinium bromide (INCRUSE ELLIPTA) 62.5 MCG/INH 1 puff  1 puff Inhalation Daily Money, Lowry Ram, FNP   1 puff at 03/08/19 0759   PTA Medications: Facility-Administered Medications Prior to Admission  Medication Dose Route Frequency Provider Last Rate Last Dose  . ipratropium-albuterol (DUONEB) 0.5-2.5 (3) MG/3ML nebulizer solution 3 mL  3 mL Nebulization Q6H Opalski, Deborah, DO   3 mL at 01/10/18 1042   Medications Prior to Admission  Medication Sig Dispense Refill Last Dose  . acetaminophen (TYLENOL) 500 MG tablet Take 1,000 mg by mouth every 6 (six) hours as needed for mild pain or headache.     . albuterol (PROVENTIL) (2.5 MG/3ML) 0.083% nebulizer solution Take 3 mLs (2.5 mg total) by nebulization every 6 (six) hours as needed for wheezing or shortness of breath. 75 mL 12   . amphetamine-dextroamphetamine (ADDERALL) 30 MG tablet Take 1 tablet by mouth 2 (two) times daily. 60 tablet 0   . aspirin EC 81 MG tablet Take 1 tablet (81 mg total) by mouth daily. 90 tablet 3   . atorvastatin (LIPITOR) 80 MG tablet Take 1 tablet (80 mg total) by mouth daily. 90 tablet 3   . BD PEN NEEDLE NANO U/F 32G X 4 MM MISC USE  AS DIRECTED WITH VICTOZA 100 each 1   . canagliflozin (INVOKANA) 300 MG TABS tablet Take 1 tablet (300 mg total) by mouth daily before breakfast. 90 tablet 1   . dicyclomine (BENTYL) 10 MG capsule Take 1 capsule (10 mg total) by mouth every 8 (eight) hours as needed for spasms. (Patient not taking: Reported on 02/21/2019) 30 capsule 3   . DULoxetine (  CYMBALTA) 60 MG capsule TAKE 1 CAPSULE(60 MG) BY MOUTH AT BEDTIME (Patient taking differently: Take 60 mg by mouth daily. TAKE 1 CAPSULE(60 MG) BY MOUTH AT BEDTIME) 30 capsule 2   . fluticasone (FLONASE) 50 MCG/ACT nasal spray USE 1 SPRAY IN EACH NOSTRIL TWICE DAILY AFTER SINUS RINSES (Patient taking differently: Place 1 spray into both nostrils daily. USE 1 SPRAY IN EACH NOSTRIL TWICE DAILY AFTER SINUS RINSES) 16 g 2   . Fluticasone-Salmeterol (ADVAIR DISKUS) 250-50 MCG/DOSE AEPB Inhale 1 puff into the lungs 2 (two) times daily. Flush mouth after each use- NO RF's NEEDS OV 1 each 0   . ibuprofen (ADVIL) 800 MG tablet Take 1 tablet (800 mg total) by mouth every 8 (eight) hours as needed. (Patient taking differently: Take 800 mg by mouth every 8 (eight) hours as needed for fever, headache or moderate pain. ) 30 tablet 0   . isosorbide mononitrate (IMDUR) 30 MG 24 hr tablet TAKE 1 TABLET BY MOUTH EVERY DAY (Patient taking differently: Take 30 mg by mouth daily. ) 90 tablet 3   . liraglutide (VICTOZA) 18 MG/3ML SOPN INJECT 0.3 MLS (1.8 MG TOTAL) INTO THE SKIN DAILY. (Patient taking differently: Inject 1.8 mg into the skin daily. INJECT 0.3 MLS (1.8 MG TOTAL) INTO THE SKIN DAILY.) 27 mL 1   . losartan (COZAAR) 25 MG tablet Take 1 tablet (25 mg total) by mouth daily. 90 tablet 3   . meclizine (ANTIVERT) 25 MG tablet Take 1 tablet (25 mg total) by mouth 3 (three) times daily as needed for dizziness or nausea. 30 tablet 0   . nicotine (NICODERM CQ - DOSED IN MG/24 HOURS) 21 mg/24hr patch Place 1 patch (21 mg total) onto the skin daily. 28 patch 0   . pantoprazole  (PROTONIX) 40 MG tablet Take 1 tablet (40 mg total) by mouth daily. 30 minutes before a meal. 90 tablet 3   . polyvinyl alcohol (LIQUIFILM TEARS) 1.4 % ophthalmic solution Place 1 drop into both eyes as needed for dry eyes.     . pregabalin (LYRICA) 150 MG capsule Take 1 capsule (150 mg total) by mouth 2 (two) times daily. No ReFills 180 capsule 1   . Vitamin D, Ergocalciferol, (DRISDOL) 50000 units CAPS capsule Take 1 capsule (50,000 Units total) by mouth every 7 (seven) days. 12 capsule 10     Musculoskeletal: Strength & Muscle Tone: within normal limits Gait & Station: normal Patient leans: N/A  Psychiatric Specialty Exam: Physical Exam  Nursing note and vitals reviewed. Constitutional: She is oriented to person, place, and time. She appears well-developed and well-nourished.  HENT:  Head: Normocephalic and atraumatic.  Respiratory: Effort normal.  Neurological: She is alert and oriented to person, place, and time.    ROS  Blood pressure (!) 141/64, pulse (!) 104, temperature 98 F (36.7 C), temperature source Oral, resp. rate 18, height 5\' 4"  (1.626 m), weight 118.8 kg, SpO2 93 %.Body mass index is 44.97 kg/m.  General Appearance: Casual  Eye Contact:  Fair  Speech:  Normal Rate  Volume:  Decreased  Mood:  Anxious and Depressed  Affect:  Congruent  Thought Process:  Coherent and Descriptions of Associations: Intact  Orientation:  Full (Time, Place, and Person)  Thought Content:  Logical  Suicidal Thoughts:  No  Homicidal Thoughts:  No  Memory:  Immediate;   Fair Recent;   Fair Remote;   Fair  Judgement:  Intact  Insight:  Fair  Psychomotor Activity:  Decreased  Concentration:  Concentration: Fair and Attention Span: Fair  Recall:  [employer] of Knowledge:  Fair  Language:  Good  Akathisia:  Negative  Handed:  Right  AIMS (if indicated):     Assets:  Desire for Improvement Resilience  ADL's:  Intact  Cognition:  WNL  Sleep:  Number of Hours: 6.75    Treatment  Plan Summary: Daily contact with patient to assess and evaluate symptoms and progress in treatment, Medication management and Plan : Patient is seen and examined.  Patient is a 53 year old female with the above-stated past psychiatric history was admitted after his intentional overdose of Xanax.  She will be admitted to the hospital.  She will be integrated into the milieu.  She will be encouraged to attend groups and work on her coping skills.  She stated that prior to the decompensation before the overdose she was doing well and having no problems with her medications.  I am not going to change her medicines today.  She still fairly oversedated from the Xanax.  I have told her that we will wean her off of the Xanax during the course of the hospitalization, especially given this is her second intentional overdose.  On her last psychiatric hospitalization Abilify was used to augment her antidepressant treatment, and that may be necessary, but I want to see how she looks initially.  I will hold her Adderall at this point.  I see no need to give her the stimulants while she is in the hospital.  She has a significant history of diabetes, hypertension, dizziness, and vitamin D deficiency, COPD, GERD as well as obstructive sleep apnea.  We will arrange for her to get her CPAP machine from her home.  Review of her laboratories showed an elevated glucose of 163 on 11/13.  Her hemoglobin A1c on 11/11 was 8.2.  Blood alcohol was negative, drug screen was positive for benzodiazepines as well as marijuana.  Her chest x-ray from 11/13 was negative.  Her EKG from 11/10 had a terrible baseline, and we will reorder that today.  Observation Level/Precautions:  15 minute checks  Laboratory:  Chemistry Profile  Psychotherapy:    Medications:    Consultations:    Discharge Concerns:    Estimated LOS:  Other:     Physician Treatment Plan for Primary Diagnosis: <principal problem not specified> Long Term Goal(s):  Improvement in symptoms so as ready for discharge  Short Term Goals: Ability to identify changes in lifestyle to reduce recurrence of condition will improve, Ability to verbalize feelings will improve, Ability to disclose and discuss suicidal ideas, Ability to demonstrate self-control will improve, Ability to identify and develop effective coping behaviors will improve and Ability to maintain clinical measurements within normal limits will improve  Physician Treatment Plan for Secondary Diagnosis: Active Problems:   MDD (major depressive disorder), severe ([place])  Long Term Goal(s): Improvement in symptoms so as ready for discharge  Short Term Goals: Ability to identify changes in lifestyle to reduce recurrence of condition will improve, Ability to verbalize feelings will improve, Ability to disclose and discuss suicidal ideas, Ability to demonstrate self-control will improve, Ability to identify and develop effective coping behaviors will improve and Ability to maintain clinical measurements within normal limits will improve  I certify that inpatient services furnished can reasonably be expected to improve the patient's condition.    Sharma Covert, MD 11/14/20203:50 PM

## 2019-03-08 NOTE — Plan of Care (Signed)
  Problem: Education: Goal: Knowledge of [facility] Education information/materials will improve Outcome: Progressing Goal: Emotional status will improve Outcome: Progressing Goal: Mental status will improve Outcome: Progressing Goal: Verbalization of understanding the information provided will improve Outcome: Progressing  D: Patient was admitted after impulsively overdosing on xanax following an argument she had with her stepdaughter. Patient has a history of depression and previous suicide attempts with multiple prior hospitalizations. Denies SI currently and contracts for safety. Denies HI and AVH. Denies substance abuse. States that due to COVID and evictions she has her children and her husband of one year's children and grandchildren asking for help and some of these family members are living with her and they need a bigger place to live. She says there is conflict with stepdaughter because she thinks patient is manipulating her father to give more help to the patient's children than her. History of COPD, DM, and sleep apnea. Skin search done and patient has reddened itchy areas on her chest from EKG electrodes and she thinks she may be allergic to the adhesive. Dr. Mallie Darting called and Benadryl 50 mg IM ordered and given A: Continue to monitor for safety.  R: Safety maintained.

## 2019-03-08 NOTE — BHH Suicide Risk Assessment (Signed)
[facility] Admission Suicide Risk Assessment   Nursing information obtained from:  Patient Demographic factors:  Caucasian, Low socioeconomic status, Unemployed Current Mental Status:  Suicidal ideation indicated by patient, Self-harm thoughts, Suicidal ideation indicated by others, Suicide plan, Intention to act on suicide plan, Plan includes specific time, place, or method, Belief that plan would result in death Loss Factors:  Financial problems / change in socioeconomic status Historical Factors:  Prior suicide attempts, Impulsivity Risk Reduction Factors:  Positive social support, Responsible for children under 78 years of age, Sense of responsibility to family, Living with another person, especially a relative  Total Time spent with patient: 30 minutes Principal Problem: <principal problem not specified> Diagnosis:  Active Problems:   MDD (major depressive disorder), severe ([place])  Subjective Data: Patient is seen and examined.  Patient is a 53 year old female with a past psychiatric history significant for major depression, generalized anxiety disorder as well as attention deficit disorder who originally presented to the [facility] emergency department on 03/05/2019 after an intentional overdose of anywhere between 15-20 Xanax.  She was admitted to the hospital and evaluated.  She was evaluated by the consult service at [facility], and decision was made to admit her to the hospital for evaluation and stabilization.  The patient stated that she had been doing well until recently.  She stated that she and her new husband had bought a home in March, and several of their family members had moved in.  After their family members had moved and some boyfriends and girlfriends of those family members moved in.  The agreement was that they would pay rent, and be able to stay in the home.  Unfortunately during the course of time it became increasingly more difficult to collect rent from the  boyfriend's etc.  She stated that this increased psychosocial stress from the financial and emotional standpoint became overwhelming for her.  She is followed by Dr. Harrington Challenger in [place], and is prescribed Xanax.  She stated she often will not take the Xanax and that was the reason she had so many tablets available.  Unfortunately this is the second intentional overdose of Xanax that she has in her past.  In 2016 she was admitted to the behavioral health hospital in [place].  It again was secondary to an intentional overdose of Xanax.  Her discharge medications at that time included Abilify, Cymbalta, Lyrica.  She last saw Dr. Harrington Challenger on 01/13/2019.  She had no complaints at that time.  Her psychiatric medications included Xanax, Adderall, Cymbalta.  She admitted to feeling overwhelmed, worsening depression, and worsening anxiety.  She was admitted to the hospital for evaluation and stabilization.  Continued Clinical Symptoms:  Alcohol Use Disorder Identification Test Final Score (AUDIT): 0 The "Alcohol Use Disorders Identification Test", Guidelines for Use in Primary Care, Second Edition.  World Pharmacologist [facility]). Score between 0-7:  no or low risk or alcohol related problems. Score between 8-15:  moderate risk of alcohol related problems. Score between 16-19:  high risk of alcohol related problems. Score 20 or above:  warrants further diagnostic evaluation for alcohol dependence and treatment.   CLINICAL FACTORS:   Severe Anxiety and/or Agitation Depression:   Anhedonia Hopelessness Impulsivity Insomnia More than one psychiatric diagnosis Previous Psychiatric Diagnoses and Treatments   Musculoskeletal: Strength & Muscle Tone: within normal limits Gait & Station: normal Patient leans: N/A  Psychiatric Specialty Exam: Physical Exam  Nursing note and vitals reviewed. Constitutional: She is oriented to person, place, and  time. She appears well-developed and well-nourished.  HENT:   Head: Normocephalic and atraumatic.  Respiratory: Effort normal.  Neurological: She is alert and oriented to person, place, and time.    ROS  Blood pressure (!) 141/64, pulse (!) 104, temperature 98 F (36.7 C), temperature source Oral, resp. rate 18, height 5\' 4"  (1.626 m), weight 118.8 kg, SpO2 93 %.Body mass index is 44.97 kg/m.  General Appearance: Disheveled  Eye Contact:  Fair  Speech:  Normal Rate  Volume:  Decreased  Mood:  Anxious and Depressed  Affect:  Congruent  Thought Process:  Coherent and Descriptions of Associations: Intact  Orientation:  Full (Time, Place, and Person)  Thought Content:  Logical  Suicidal Thoughts:  No  Homicidal Thoughts:  No  Memory:  Immediate;   Fair Recent;   Fair Remote;   Fair  Judgement:  Intact  Insight:  Fair  Psychomotor Activity:  Psychomotor Retardation  Concentration:  Concentration: Fair and Attention Span: Fair  Recall:  [employer] of Knowledge:  Fair  Language:  Good  Akathisia:  Negative  Handed:  Right  AIMS (if indicated):     Assets:  Desire for Improvement Resilience  ADL's:  Intact  Cognition:  WNL  Sleep:  Number of Hours: 6.75      COGNITIVE FEATURES THAT CONTRIBUTE TO RISK:  None    SUICIDE RISK:   Mild:  Suicidal ideation of limited frequency, intensity, duration, and specificity.  There are no identifiable plans, no associated intent, mild dysphoria and related symptoms, good self-control (both objective and subjective assessment), few other risk factors, and identifiable protective factors, including available and accessible social support.  PLAN OF CARE: Patient is seen and examined.  Patient is a 53 year old female with the above-stated past psychiatric history was admitted after his intentional overdose of Xanax.  She will be admitted to the hospital.  She will be integrated into the milieu.  She will be encouraged to attend groups and work on her coping skills.  She stated that prior to the  decompensation before the overdose she was doing well and having no problems with her medications.  I am not going to change her medicines today.  She still fairly oversedated from the Xanax.  I have told her that we will wean her off of the Xanax during the course of the hospitalization, especially given this is her second intentional overdose.  On her last psychiatric hospitalization Abilify was used to augment her antidepressant treatment, and that may be necessary, but I want to see how she looks initially.  I will hold her Adderall at this point.  I see no need to give her the stimulants while she is in the hospital.  She has a significant history of diabetes, hypertension, dizziness, and vitamin D deficiency, COPD, GERD as well as obstructive sleep apnea.  We will arrange for her to get her CPAP machine from her home.  Review of her laboratories showed an elevated glucose of 163 on 11/13.  Her hemoglobin A1c on 11/11 was 8.2.  Blood alcohol was negative, drug screen was positive for benzodiazepines as well as marijuana.  Her chest x-ray from 11/13 was negative.  Her EKG from 11/10 had a terrible baseline, and we will reorder that today. I certify that inpatient services furnished can reasonably be expected to improve the patient's condition.   Sharma Covert, MD 03/08/2019, 10:45 AM

## 2019-03-09 ENCOUNTER — Inpatient Hospital Stay: Payer: Medicare HMO

## 2019-03-09 LAB — GLUCOSE, CAPILLARY
Glucose-Capillary: 191 mg/dL — ABNORMAL HIGH (ref 70–99)
Glucose-Capillary: 150 mg/dL — ABNORMAL HIGH (ref 70–99)

## 2019-03-09 MED ORDER — FLUCONAZOLE 50 MG PO TABS
150.0000 mg | ORAL_TABLET | Freq: Once | ORAL | Status: DC | PRN
  Filled 2019-03-09: qty 1

## 2019-03-09 MED ORDER — ALBUTEROL SULFATE (2.5 MG/3ML) 0.083% IN NEBU
2.5000 mg | INHALATION_SOLUTION | RESPIRATORY_TRACT | Status: DC | PRN
  Administered 2019-03-09 (×2): 2.5 mg via RESPIRATORY_TRACT
  Filled 2019-03-09 (×2): qty 3

## 2019-03-09 MED ORDER — RISAQUAD PO CAPS
1.0000 | ORAL_CAPSULE | Freq: Every day | ORAL | Status: DC
  Administered 2019-03-09 – 2019-03-11 (×3): 1 via ORAL
  Filled 2019-03-09 (×3): qty 1

## 2019-03-09 MED ORDER — GUAIFENESIN-DM 100-10 MG/5ML PO SYRP
5.0000 mL | ORAL_SOLUTION | ORAL | Status: DC | PRN
  Administered 2019-03-09 – 2019-03-11 (×6): 5 mL via ORAL
  Filled 2019-03-09 (×14): qty 5

## 2019-03-09 MED ORDER — LEVOFLOXACIN 500 MG PO TABS
500.0000 mg | ORAL_TABLET | Freq: Every day | ORAL | Status: DC
  Administered 2019-03-09 – 2019-03-11 (×3): 500 mg via ORAL
  Filled 2019-03-09 (×3): qty 1

## 2019-03-09 NOTE — Progress Notes (Signed)
Pt was ordered a  CPAP from the hospital to use for the rest of the stay. The respiratory therapist thinks it is best due to the shape and uncleanliness of the one brought from home. Pt sates it is also not hers but one someone gave her. The RT also is having one that is an auto one. Collier Bullock RN

## 2019-03-09 NOTE — Plan of Care (Signed)
  Problem: Activity: Goal: Interest or engagement in activities will improve Outcome: Progressing Goal: Sleeping patterns will improve Outcome: Progressing  D: Patient has been pleasant and cooperative. Denies SI, HI and AVH. Appears more relaxed. Visited with husband A: Continue to monitor for safety R: Safety maintained.
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
## 2019-03-09 NOTE — Progress Notes (Signed)
[facility] MD Progress Note  03/09/2019 12:17 PM [name]  MRN:  [id number] Subjective: Patient is a 53 year old female with a past psychiatric history significant for major depression, generalized anxiety disorder as well as attention deficit disorder who originally presented to the [facility] emergency department on 03/05/2019 after an intentional overdose of Xanax.  Objective: Patient is seen and examined.  Patient is a 53 year old female with the above-stated past psychiatric history who is seen in follow-up.  From a psychiatric perspective she is doing fine.  She does complain of congestion and cough.  She is afebrile.  She did have a chest x-ray on 11/13 which showed no radiographic evidence of any acute cardiopulmonary process.  Her white count on admission was mildly elevated at 12.1.  She has been afebrile during the course of the hospitalization.  She denied any suicidal or homicidal ideation.  She denied any other psychiatric issues at this point.  Her most recent coronavirus test on 03/05/19 was negative.  Principal Problem: <principal problem not specified> Diagnosis: Active Problems:   MDD (major depressive disorder), severe (HCC)  Total Time spent with patient: 20 minutes  Past Psychiatric History: See admission H&P  Past Medical History:  Past Medical History:  Diagnosis Date  . Anemia   . Anxiety   . Asthmatic bronchitis    normal PFT/ seen by pulmonary no evidence of COPD  . Atypical chest pain 10/05/2017  . Chest pain on respiration 03/25/2014  . Chronic bronchitis ([place])   . Chronic lower back pain   . Chronic respiratory failure ([place]) 10/16/2011   Newly 02 dep 24/7 p discharge from [facility] 01/2013  - 03/13/2014  Walked RA  2 laps @ 185 ft each stopped due to  Sob/ aching in legs, thirsty/ no desat @ slow pace   . Chronic respiratory failure with hypoxia (HCC)    On 2-3 L of oxygen at home  . Cigarette smoker 12/02/2010   Followed in Pulmonary  clinic/ [facility]/ [place]   - Limits of effective care reviewed 12/22/2011    . COPD (chronic obstructive pulmonary disease) ([place])   . COPD with chronic bronchitis ([place]) 10/01/2017  . Daily headache   . Depression   . Diabetic peripheral neuropathy ([place])   . DM (diabetes mellitus) type II controlled, neurological manifestation ([place]) 12/02/2010  . Gastric erosions    EGD 08/2010.  [name] GERD (gastroesophageal reflux disease)   . H/O drug abuse ([place]) 11/12/2017   -- scanned document from outside source: [facility] in [place] which showed UDS positive for Adderall /amphetamine usage as well as positive urine for THC.  This test result was collected 08/11/2016 and reported 10/07/2016. - lso review of the chart shows another positive amphetamine, THC and METH in the urine back on 10/18/2015 under "care everywhere". ---So   . Heavy menses   . High cholesterol   . History of blood transfusion    "related to low HgB" (10/05/2017)  . History of hiatal hernia   . HTN (hypertension)   . Hyperlipidemia associated with type 2 diabetes mellitus ([place]) 10/18/2015  . Hypertension associated with diabetes ([place]) 12/02/2010   D/c acei 12/22/2011 due to psuedowheeze and narcotic dependent cough> ? Improved - [id number] started bystolic in place of cozar due to cough    . Increased urinary protein excretion   . Internal hemorrhoids    Colonoscopy 5/12.  . Iron deficiency anemia 10/01/2017  . Mixed diabetic hyperlipidemia  associated with type 2 diabetes mellitus ([place]) 10/01/2017  . On home oxygen therapy    "5L at night" (10/05/2017)  . OSA on CPAP   . Osteoarthritis    "back" (10/05/2017)  . Oxygen dependent 10/16/2011  . Pneumonia    "lots of times" (10/05/2017)  . Poorly controlled diabetes mellitus ([place]) 11/12/2017  . Pulmonary infiltrates 12/22/2011   Followed in Pulmonary clinic/ [facility]/ [place]    - See CT Chest 05/05/11   . Shortness of breath   . Tachycardia     never had test done since no insurance  . Tobacco use disorder-current smoker greater than 40-pack-year history- since age 5 1 ppd 10/01/2017  . Type II diabetes mellitus ([place])     Past Surgical History:  Procedure Laterality Date  . CESAREAN SECTION  1988; 1989  . CHOLECYSTECTOMY OPEN  1990  . COLONOSCOPY  09/16/2010   [id number] colon/small internal hemorrhoids  . ESOPHAGOGASTRODUODENOSCOPY  09/16/2010   SLF: normal/mild gastritis  . ESOPHAGOGASTRODUODENOSCOPY N/A 10/19/2014   Procedure: ESOPHAGOGASTRODUODENOSCOPY (EGD);  Surgeon: Danie Binder, MD;  Location: [facility] ENDO SUITE;  Service: Endoscopy;  Laterality: N/A;  830  . FRACTURE SURGERY    . HYSTEROSCOPY WITH THERMACHOICE  01/17/2012   Procedure: HYSTEROSCOPY WITH THERMACHOICE;  Surgeon: Florian Buff, MD;  Location: [facility] ORS;  Service: Gynecology;  Laterality: N/A;  total therapy time: 9:13sec  D5W  18 ml in, D5W   73ml out, temperture 87degrees celcious  . KIDNEY SURGERY     as child for blockages  . TONSILLECTOMY    . TUBAL LIGATION  1989  . TYMPANOSTOMY TUBE PLACEMENT Bilateral    "several times when I was a child"  . uterine ablation    . WRIST FRACTURE SURGERY Left 1995   Family History:  Family History  Problem Relation Age of Onset  . Heart attack Father 33       deceased, etoh use  . Heart disease Father   . Alcohol abuse Father   . Depression Father   . Heart failure Father   . Heart attack Mother 94       deceased  . Diabetes Mother   . Breast cancer Mother   . Heart failure Mother        oxygen dependence, nonsmoker  . Heart disease Mother   . Depression Mother   . Cancer Mother   . Liver disease Maternal Aunt 11       died while on liver transplant list  . Heart attack Maternal Grandmother        premature CAD  . Ulcers Sister   . Hypertension Sister   . Heart failure Sister   . Colon cancer Neg Hx    Family Psychiatric  History: See admission H&P Social History:  Social History   Substance and  Sexual Activity  Alcohol Use Yes   Comment: 10/05/2017 "5 - 8 shots tequilia//month"     Social History   Substance and Sexual Activity  Drug Use Yes  . Types: Marijuana   Comment: 10/05/2017 "a few times/wk"    Social History   Socioeconomic History  . Marital status: Married    Spouse name: Not on file  . Number of children: 2  . Years of education: Not on file  . Highest education level: Not on file  Occupational History  . Occupation: unemployed  Social Needs  . Financial resource strain: Not on file  . Food insecurity    Worry: Not on  file    Inability: Not on file  . Transportation needs    Medical: Not on file    Non-medical: Not on file  Tobacco Use  . Smoking status: Current Every Day Smoker    Packs/day: 1.00    Years: 39.00    Pack years: 39.00    Types: Cigarettes  . Smokeless tobacco: Never Used  Substance and Sexual Activity  . Alcohol use: Yes    Comment: 10/05/2017 "5 - 8 shots tequilia//month"  . Drug use: Yes    Types: Marijuana    Comment: 10/05/2017 "a few times/wk"  . Sexual activity: Yes    Partners: Male    Birth control/protection: Surgical  Lifestyle  . Physical activity    Days per week: Not on file    Minutes per session: Not on file  . Stress: Not on file  Relationships  . Social Herbalist on phone: Not on file    Gets together: Not on file    Attends religious service: Not on file    Active member of club or organization: Not on file    Attends meetings of clubs or organizations: Not on file    Relationship status: Not on file  Other Topics Concern  . Not on file  Social History Narrative  . Not on file   Additional Social History:    History of alcohol / drug use?: No history of alcohol / drug abuse                    Sleep: Fair  Appetite:  Fair  Current Medications: Current Facility-Administered Medications  Medication Dose Route Frequency Provider Last Rate Last Dose  . acetaminophen (TYLENOL)  tablet 650 mg  650 mg Oral Q6H PRN Money, Lowry Ram, FNP   650 mg at 03/09/19 0901  . albuterol (VENTOLIN HFA) 108 (90 Base) MCG/ACT inhaler 1-2 puff  1-2 puff Inhalation Q6H PRN Sharma Covert, MD      . alum & mag hydroxide-simeth (MAALOX/MYLANTA) 200-200-20 MG/5ML suspension 30 mL  30 mL Oral Q4H PRN Money, Lowry Ram, FNP      . aspirin EC tablet 81 mg  81 mg Oral Daily Sharma Covert, MD   81 mg at 03/09/19 0743  . atorvastatin (LIPITOR) tablet 80 mg  80 mg Oral q1800 Money, Lowry Ram, FNP   80 mg at 03/08/19 1833  . canagliflozin (INVOKANA) tablet 300 mg  300 mg Oral QAC breakfast Money, Darnelle Maffucci B, FNP      . diphenhydrAMINE (BENADRYL) capsule 25 mg  25 mg Oral Q6H PRN Sharma Covert, MD   25 mg at 03/08/19 2020  . DULoxetine (CYMBALTA) DR capsule 60 mg  60 mg Oral Daily Money, Lowry Ram, FNP   60 mg at 03/09/19 0743  . famotidine (PEPCID) tablet 20 mg  20 mg Oral BID Sharma Covert, MD   20 mg at 03/09/19 0743  . fluticasone furoate-vilanterol (BREO ELLIPTA) 200-25 MCG/INH 1 puff  1 puff Inhalation Daily Money, Travis B, FNP   1 puff at 03/09/19 0745  . hydrocortisone cream 1 %   Topical Q8H PRN Sharma Covert, MD      . hydrOXYzine (ATARAX/VISTARIL) tablet 25 mg  25 mg Oral TID PRN Sharma Covert, MD      . influenza vac split quadrivalent PF (FLUARIX) injection 0.5 mL  0.5 mL Intramuscular Tomorrow-1000 Clapacs, Madie Reno, MD      . insulin  aspart (novoLOG) injection 0-15 Units  0-15 Units Subcutaneous TID WC Money, Lowry Ram, FNP   2 Units at 03/09/19 1130  . insulin aspart (novoLOG) injection 0-5 Units  0-5 Units Subcutaneous QHS Money, Lowry Ram, FNP      . insulin glargine (LANTUS) injection 24 Units  24 Units Subcutaneous QHS Money, Lowry Ram, FNP   24 Units at 03/08/19 2143  . loratadine (CLARITIN) tablet 10 mg  10 mg Oral Daily Sharma Covert, MD   10 mg at 03/09/19 0743  . LORazepam (ATIVAN) tablet 1 mg  1 mg Oral Q6H PRN Sharma Covert, MD      . losartan  (COZAAR) tablet 25 mg  25 mg Oral Daily Money, Lowry Ram, FNP   25 mg at 03/09/19 0901  . magnesium hydroxide (MILK OF MAGNESIA) suspension 30 mL  30 mL Oral Daily PRN Money, Darnelle Maffucci B, FNP      . nicotine (NICODERM CQ - dosed in mg/24 hours) patch 21 mg  21 mg Transdermal Daily Money, Darnelle Maffucci B, FNP   21 mg at 03/09/19 0740  . pantoprazole (PROTONIX) EC tablet 40 mg  40 mg Oral Daily Money, Lowry Ram, FNP   40 mg at 03/09/19 0743  . pneumococcal 23 valent vaccine (PNEUMOVAX-23) injection 0.5 mL  0.5 mL Intramuscular Tomorrow-1000 Clapacs, John T, MD      . pregabalin (LYRICA) capsule 100 mg  100 mg Oral BID Money, Lowry Ram, FNP   100 mg at 03/09/19 0748  . traZODone (DESYREL) tablet 50 mg  50 mg Oral QHS PRN Sharma Covert, MD      . umeclidinium bromide (INCRUSE ELLIPTA) 62.5 MCG/INH 1 puff  1 puff Inhalation Daily Money, Lowry Ram, FNP   1 puff at 03/09/19 0744    Lab Results:  Results for orders placed or performed during the hospital encounter of 03/07/19 (from the past 48 hour(s))  Glucose, capillary     Status: Abnormal   Collection Time: 03/07/19 10:13 PM  Result Value Ref Range   Glucose-Capillary 196 (H) 70 - 99 mg/dL  Glucose, capillary     Status: Abnormal   Collection Time: 03/08/19  7:50 AM  Result Value Ref Range   Glucose-Capillary 136 (H) 70 - 99 mg/dL  Glucose, capillary     Status: Abnormal   Collection Time: 03/08/19 11:12 AM  Result Value Ref Range   Glucose-Capillary 139 (H) 70 - 99 mg/dL   Comment 1 Notify RN   Glucose, capillary     Status: Abnormal   Collection Time: 03/08/19  4:25 PM  Result Value Ref Range   Glucose-Capillary 131 (H) 70 - 99 mg/dL   Comment 1 Notify RN   Glucose, capillary     Status: Abnormal   Collection Time: 03/08/19  8:50 PM  Result Value Ref Range   Glucose-Capillary 149 (H) 70 - 99 mg/dL   Comment 1 Notify RN   Glucose, capillary     Status: Abnormal   Collection Time: 03/09/19  7:04 AM  Result Value Ref Range   Glucose-Capillary  191 (H) 70 - 99 mg/dL   Comment 1 Notify RN   Glucose, capillary     Status: Abnormal   Collection Time: 03/09/19 11:17 AM  Result Value Ref Range   Glucose-Capillary 150 (H) 70 - 99 mg/dL    Blood Alcohol level:  Lab Results  Component Value Date   ETH <10 03/04/2019   ETH <5 99991111    Metabolic Disorder Labs:  Lab Results  Component Value Date   HGBA1C 8.2 (H) 03/05/2019   MPG 188.64 03/05/2019   MPG 160 (H) 03/10/2015   No results found for: PROLACTIN Lab Results  Component Value Date   CHOL 131 02/12/2018   TRIG 89 03/06/2019   HDL 38 (L) 02/12/2018   CHOLHDL 3.4 02/12/2018   VLDL 35 10/06/2017   LDLCALC 74 02/12/2018   LDLCALC 132 (H) 10/06/2017    Physical Findings: AIMS: Facial and Oral Movements Muscles of Facial Expression: None, normal Lips and Perioral Area: None, normal Jaw: None, normal Tongue: None, normal,Extremity Movements Upper (arms, wrists, hands, fingers): None, normal Lower (legs, knees, ankles, toes): None, normal, Trunk Movements Neck, shoulders, hips: None, normal, Overall Severity Severity of abnormal movements (highest score from questions above): None, normal Incapacitation due to abnormal movements: None, normal Patient's awareness of abnormal movements (rate only patient's report): No Awareness, Dental Status Current problems with teeth and/or dentures?: No Does patient usually wear dentures?: No  CIWA:    COWS:     Musculoskeletal: Strength & Muscle Tone: within normal limits Gait & Station: normal Patient leans: N/A  Psychiatric Specialty Exam: Physical Exam  Nursing note and vitals reviewed. Constitutional: She is oriented to person, place, and time. She appears well-developed and well-nourished.  HENT:  Head: Normocephalic and atraumatic.  Respiratory: Effort normal.  Neurological: She is alert and oriented to person, place, and time.    ROS  Blood pressure (!) 141/83, pulse 94, temperature 98.4 F (36.9 C),  temperature source Oral, resp. rate 18, height 5\' 4"  (1.626 m), weight 118.8 kg, SpO2 98 %.Body mass index is 44.97 kg/m.  General Appearance: Casual  Eye Contact:  Fair  Speech:  Normal Rate  Volume:  Normal  Mood:  Anxious  Affect:  Congruent  Thought Process:  Coherent and Descriptions of Associations: Intact  Orientation:  Full (Time, Place, and Person)  Thought Content:  Logical  Suicidal Thoughts:  No  Homicidal Thoughts:  No  Memory:  Immediate;   Fair Recent;   Fair Remote;   Fair  Judgement:  Fair  Insight:  Fair  Psychomotor Activity:  Increased  Concentration:  Concentration: Fair and Attention Span: Fair  Recall:  [employer] of Knowledge:  Fair  Language:  Good  Akathisia:  Negative  Handed:  Right  AIMS (if indicated):     Assets:  Desire for Improvement Resilience  ADL's:  Intact  Cognition:  WNL  Sleep:  Number of Hours: 6     Treatment Plan Summary: Daily contact with patient to assess and evaluate symptoms and progress in treatment, Medication management and Plan : Patient is seen and examined.  Patient is a 53 year old female with the above-stated past psychiatric history who is seen in follow-up.   Diagnosis: #1 major depression, recurrent, severe without psychotic features, #2 generalized anxiety disorder, #3 COPD exacerbation versus possible bronchitis.  #4 diabetes mellitus type 2, #5 hyperlipidemia, #6 COPD, #7 hypertension  Patient is seen in follow-up.  From a psychiatric perspective she is doing better, but she is having some physical issues.  Her cough is increased, and on physical examination today has some mild expiratory wheezing.  She is had 3 COVID-19 test in the last week or 2, and does have likely been all negative.  She had a chest x-ray done that was a portable that was negative.  She has had a recent steroid pack for COPD exacerbation.  Given the length of time that she has  been having these problems I am going to get a chest x-ray here with  2 views.  I am also going to start her on Levaquin 500 mg p.o. daily.  We will also give her albuterol nebulizations in the short run.  I will give her some dextromethorphan or Robitussin for her cough.  She just had the steroids recently, and at least at this point her wheezing does not appear to require that significant of an intervention.  She also has had problems with fungal infections following broad-spectrum antibiotics, and I will write for Diflucan if necessary.  I will also order influenza a and B just in case. 1.  Levaquin 500 mg p.o. daily for COPD exacerbation versus community-acquired pneumonia. 2.  Robitussin-DM as needed for cough. 3.  Add albuterol nebulizations as needed for COPD and COPD exacerbation. 4.  Chest x-ray two-view to rule out pneumonia. 5.  Continue Lipitor 80 mg p.o. daily for hyperlipidemia. 6.  Continue Invokana 300 mg p.o. daily for diabetes mellitus type 2. 7.  Duloxetine 60 mg p.o. daily for anxiety and depression. 8.  Continue famotidine 20 mg p.o. twice daily for reflux disease. 9.  Continue Breo Ellipta 200/25 1 puff daily for COPD. 10.  Hydrocortisone cream 1% applied to rash as needed itching. 11.  Continue hydroxyzine 25 mg p.o. 3 times daily as needed anxiety. 12.  Continue sliding scale insulin secondary to diabetes mellitus. 13.  Continue Lantus insulin 24 units subcu nightly for diabetes mellitus. 14.  Continue Claritin 10 mg p.o. daily for asthma and allergic reaction. 15.  Continue Ativan 1 mg p.o. every 6 hours as needed a CIWA greater than 10. 16.  Increase Cozaar to 50 mg p.o. daily for hypertension. 17.  Continue Protonix 40 mg p.o. daily for reflux disease. 18.  Continue pregabalin 100 mg p.o. twice daily for mood stability, chronic pain. 19.  Continue trazodone 50 mg p.o. nightly as needed insomnia. 20.  Continue Incruse Ellipta 62.5 mcg 1 puff daily for COPD. 21.  check for influenza a and B 20.  2 disposition planning-in progress.  Sharma Covert, MD 03/09/2019, 12:17 PM

## 2019-03-09 NOTE — Progress Notes (Signed)
D: Patient has been pleasant and cooperative. Denies SI, HI and AVH. Appears more relaxed. Visited with husband A: Continue to monitor for safety R: Safety maintained.

## 2019-03-09 NOTE — B[facility] Group Notes (Signed)
LCSW Group Therapy Note 03/09/2019 1:15pm  Type of Therapy and Topic: Group Therapy: Feelings Around Returning Home & Establishing a Supportive Framework and Supporting Oneself When Supports Not Available  Participation Level: Did Not Attend  Description of Group:  Patients first processed thoughts and feelings about upcoming discharge. These included fears of upcoming changes, lack of change, new living environments, judgements and expectations from others and overall stigma of mental health issues. The group then discussed the definition of a supportive framework, what that looks and feels like, and how do to discern it from an unhealthy non-supportive network. The group identified different types of supports as well as what to do when your family/friends are less than helpful or unavailable  Therapeutic Goals  1. Patient will identify one healthy supportive network that they can use at discharge. 2. Patient will identify one factor of a supportive framework and how to tell it from an unhealthy network. 3. Patient able to identify one coping skill to use when they do not have positive supports from others. 4. Patient will demonstrate ability to communicate their needs through discussion and/or role plays.  Summary of Patient Progress:  Pt was invited to attend group but chose not to attend. CSW will continue to encourage pt to attend group throughout their admission.   Therapeutic Modalities Cognitive Behavioral Therapy Motivational Interviewing   Onita Pfluger  CUEBAS-COLON, LCSW 03/09/2019 12:21 PM

## 2019-03-09 NOTE — Plan of Care (Addendum)
Pt rates anxiety 3/10 and denies depression, SI, HI and AVH. Pt was educated on care plan and verbalizes understanding. Collier Bullock RN Problem: Education: Goal: Knowledge of [facility] Education information/materials will improve Outcome: Progressing Goal: Emotional status will improve Outcome: Progressing Goal: Mental status will improve Outcome: Progressing Goal: Verbalization of understanding the information provided will improve Outcome: Progressing   Problem: Activity: Goal: Interest or engagement in activities will improve Outcome: Progressing Goal: Sleeping patterns will improve Outcome: Progressing   Problem: Coping: Goal: Ability to verbalize frustrations and anger appropriately will improve Outcome: Progressing Goal: Ability to demonstrate self-control will improve Outcome: Progressing   Problem: Coping: Goal: Ability to verbalize frustrations and anger appropriately will improve Outcome: Progressing Goal: Ability to demonstrate self-control will improve Outcome: Progressing   Problem: Health Behavior/Discharge Planning: Goal: Identification of resources available to assist in meeting health care needs will improve Outcome: Progressing Goal: Compliance with treatment plan for underlying cause of condition will improve Outcome: Progressing   Problem: Physical Regulation: Goal: Ability to maintain clinical measurements within normal limits will improve Outcome: Progressing   Problem: Safety: Goal: Periods of time without injury will increase Outcome: Progressing   Problem: Education: Goal: Utilization of techniques to improve thought processes will improve Outcome: Progressing Goal: Knowledge of the prescribed therapeutic regimen will improve Outcome: Progressing   Problem: Activity: Goal: Interest or engagement in leisure activities will improve Outcome: Progressing Goal: Imbalance in normal sleep/wake cycle will improve Outcome: Progressing    Problem: Coping: Goal: Coping ability will improve Outcome: Progressing Goal: Will verbalize feelings Outcome: Progressing   Problem: Health Behavior/Discharge Planning: Goal: Ability to make decisions will improve Outcome: Progressing Goal: Compliance with therapeutic regimen will improve Outcome: Progressing   Problem: Role Relationship: Goal: Will demonstrate positive changes in social behaviors and relationships Outcome: Progressing   Problem: Safety: Goal: Ability to disclose and discuss suicidal ideas will improve Outcome: Progressing Goal: Ability to identify and utilize support systems that promote safety will improve Outcome: Progressing   Problem: Self-Concept: Goal: Will verbalize positive feelings about self Outcome: Progressing Goal: Level of anxiety will decrease Outcome: Progressing

## 2019-03-10 DIAGNOSIS — F322 Major depressive disorder, single episode, severe without psychotic features: Principal | ICD-10-CM

## 2019-03-10 LAB — CULTURE, BLOOD (ROUTINE X 2)
Culture: NO GROWTH
Special Requests: ADEQUATE

## 2019-03-10 LAB — GLUCOSE, CAPILLARY
Glucose-Capillary: 131 mg/dL — ABNORMAL HIGH (ref 70–99)
Glucose-Capillary: 192 mg/dL — ABNORMAL HIGH (ref 70–99)
Glucose-Capillary: 154 mg/dL — ABNORMAL HIGH (ref 70–99)
Glucose-Capillary: 159 mg/dL — ABNORMAL HIGH (ref 70–99)
Glucose-Capillary: 129 mg/dL — ABNORMAL HIGH (ref 70–99)

## 2019-03-10 MED ORDER — PREGABALIN 25 MG PO CAPS
150.0000 mg | ORAL_CAPSULE | Freq: Two times a day (BID) | ORAL | Status: DC
  Administered 2019-03-10 – 2019-03-11 (×2): 150 mg via ORAL
  Filled 2019-03-10 (×2): qty 6

## 2019-03-10 NOTE — Progress Notes (Signed)
Blood sugar was 129. Data does not transfer to computer. Collier Bullock RN

## 2019-03-10 NOTE — Progress Notes (Signed)
D - Patient was in her room upon arrival to the unit. Patient was pleasant during assessment denying SI/HI/AVH, pain with this Probation officer. Patient says she still has some anxiety about her living situation with her in-laws but has come up with new ways to cope.   A - Patient compliant with medication administration per MD orders and procedures on the unit. Patient given education. Patient given support and encouragement to be active in her treatment plan. Patient informed to let staff know if there are any issues or problems on the unit.   R - Patient being monitored Q 15 minutes for safety per unit protocol. Patient remains safe on the unit.

## 2019-03-10 NOTE — Progress Notes (Signed)
Recreation Therapy Notes    Date: 03/10/2019  Time: 9:30 am  Location: Craft room  Behavioral response: Appropriate   Intervention Topic: Goals   Discussion/Intervention:   Group content on today was focused on goals. Patients described what goals are and how they define goals. Individuals expressed how they go about setting goals and reaching them. The group identified how important goals are and if they make short term goals to reach long term goals. Patients described how many goals they work on at a time and what affects them not reaching their goal. Individuals described how much time they put into planning and obtaining their goals. The group participated in the intervention "My Goal Board" and made personal goal boards to help them achieve their goal. Clinical Observations/Feedback:  Patient came to group and identified her goal as getting her kids out of the home so her and husband can live alone. She stated that habits keep her from reaching her goals sometimes. Individual was social with peers and staff while participating in group.  [name] LRT/CTRS         Sheila Ocasio 03/10/2019 11:50 AM

## 2019-03-10 NOTE — [facility] Suicide Risk Assessment (Signed)
[place] INPATIENT:  Family/Significant Other Suicide Prevention Education  Suicide Prevention Education:  Education Completed; [name], husband [phone] has been identified by the patient as the family member/significant other with whom the patient will be residing, and identified as the person(s) who will aid the patient in the event of a mental health crisis (suicidal ideations/suicide attempt).  With written consent from the patient, the family member/significant other has been provided the following suicide prevention education, prior to the and/or following the discharge of the patient.  The suicide prevention education provided includes the following:  Suicide risk factors  Suicide prevention and interventions  National Suicide Hotline telephone number  [facility] assessment telephone number  [facility] Emergency Assistance [place] and/or Residential Mobile Crisis Unit telephone number  Request made of family/significant other to:  Remove weapons (e.g., guns, rifles, knives), all items previously/currently identified as safety concern.    Remove drugs/medications (over-the-counter, prescriptions, illicit drugs), all items previously/currently identified as a safety concern.  The family member/significant other verbalizes understanding of the suicide prevention education information provided.  The family member/significant other agrees to remove the items of safety concern listed above.  CSW spoke with pts husband, [name] who reported that pt is in the hospital due to stress because of the number of people living in one house. [name] denied concerns for SI/HI or with pt returning home. [name] reported there are guns in the home and they are not secured, but they are not loaded and the ammunition for the guns are locked and secured.   [place] MSW LCSW 03/10/2019, 11:04 AM
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
## 2019-03-10 NOTE — Progress Notes (Signed)
Recreation Therapy Notes  INPATIENT RECREATION THERAPY ASSESSMENT  Patient Details Name: [name] MRN: [id number] DOB: 1965/08/30 Today's Date: 03/10/2019       Information Obtained From: Patient  Able to Participate in Assessment/Interview: Yes  Patient Presentation: Responsive  Reason for Admission (Per Patient): Active Symptoms, Suicidal Ideation, Suicide Attempt  Patient Stressors:    Coping Skills:   Exercise, Art  Leisure Interests (2+):  Art - Paint, Therapist, music - Flower gardening  Frequency of Recreation/Participation:    Awareness of Community Resources:     [employer]:     Current Use:    If no, Barriers?:    Expressed Interest in [employer] Information:    [place] of Residence:  [place]  Patient Main Form of Transportation: Musician  Patient Strengths:  Caring, helping  Patient Identified Areas of Improvement:  putting myself first  Patient Goal for Hospitalization:  Getting in touch with resources  Current SI (including self-harm):  No  Current HI:  No  Current AVH: No  Staff Intervention Plan: Group Attendance, Collaborate with Interdisciplinary Treatment Team  Consent to Intern Participation: N/A  [name] 03/10/2019, 4:08 PM

## 2019-03-10 NOTE — Plan of Care (Signed)
  Problem: Education: Goal: Knowledge of [facility] Education information/materials will improve Outcome: Progressing Goal: Emotional status will improve Outcome: Progressing Goal: Mental status will improve Outcome: Progressing Goal: Verbalization of understanding the information provided will improve Outcome: Progressing  D: Patient is out on the unit. Interacting well with staff and peers. Denies SI, HI and AVH. Mood is pleasant. Affect is appropriate to circumstance. Contracting for safety. Voices no complaints. Medication compliant. Respiratory came  up to give patient a nebulizer treatment and to bring patient a new CPAP machine, as the one she brought from home was dirty and had mold inside  A: continue to monitor for safety R: Safety maintained.

## 2019-03-10 NOTE — Plan of Care (Signed)
Pt rates depression and anxiety both at 2/10. Pt denies SI, HI and AVH. Pt was educated on care plan and verbalizes understanding. Collier Bullock RN Problem: Education: Goal: Knowledge of [facility] Education information/materials will improve Outcome: Progressing Goal: Emotional status will improve Outcome: Progressing Goal: Mental status will improve Outcome: Progressing Goal: Verbalization of understanding the information provided will improve Outcome: Progressing   Problem: Activity: Goal: Interest or engagement in activities will improve Outcome: Progressing Goal: Sleeping patterns will improve Outcome: Progressing   Problem: Coping: Goal: Ability to verbalize frustrations and anger appropriately will improve Outcome: Progressing Goal: Ability to demonstrate self-control will improve Outcome: Progressing   Problem: Health Behavior/Discharge Planning: Goal: Identification of resources available to assist in meeting health care needs will improve Outcome: Progressing Goal: Compliance with treatment plan for underlying cause of condition will improve Outcome: Progressing   Problem: Physical Regulation: Goal: Ability to maintain clinical measurements within normal limits will improve Outcome: Progressing   Problem: Safety: Goal: Periods of time without injury will increase Outcome: Progressing   Problem: Education: Goal: Utilization of techniques to improve thought processes will improve Outcome: Progressing Goal: Knowledge of the prescribed therapeutic regimen will improve Outcome: Progressing   Problem: Activity: Goal: Interest or engagement in leisure activities will improve Outcome: Progressing Goal: Imbalance in normal sleep/wake cycle will improve Outcome: Progressing   Problem: Coping: Goal: Coping ability will improve Outcome: Progressing Goal: Will verbalize feelings Outcome: Progressing   Problem: Health Behavior/Discharge Planning: Goal:  Ability to make decisions will improve Outcome: Progressing Goal: Compliance with therapeutic regimen will improve Outcome: Progressing   Problem: Role Relationship: Goal: Will demonstrate positive changes in social behaviors and relationships Outcome: Progressing   Problem: Safety: Goal: Ability to disclose and discuss suicidal ideas will improve Outcome: Progressing Goal: Ability to identify and utilize support systems that promote safety will improve Outcome: Progressing   Problem: Self-Concept: Goal: Will verbalize positive feelings about self Outcome: Progressing Goal: Level of anxiety will decrease Outcome: Progressing

## 2019-03-10 NOTE — Progress Notes (Signed)
D: Patient is out on the unit. Interacting well with staff and peers. Denies SI, HI and AVH. Mood is pleasant. Affect is appropriate to circumstance. Contracting for safety. Voices no complaints. Medication compliant. Respiratory came  up to give patient a nebulizer treatment and to bring patient a new CPAP machine, as the one she brought from home was dirty and had mold inside  A: continue to monitor for safety R: Safety maintained.

## 2019-03-10 NOTE — [facility] Suicide Risk Assessment (Signed)
[place] INPATIENT:  Family/Significant Other Suicide Prevention Education  Suicide Prevention Education:  Contact Attempts: Mang Parlin, husband [phone] has been identified by the patient as the family member/significant other with whom the patient will be residing, and identified as the person(s) who will aid the patient in the event of a mental health crisis.  With written consent from the patient, two attempts were made to provide suicide prevention education, prior to and/or following the patient's discharge.  We were unsuccessful in providing suicide prevention education.  A suicide education pamphlet was given to the patient to share with family/significant other.  Date and time of first attempt: 03/10/2019 at 10:14AM Date and time of second attempt:  CSW left a HIPAA compliant voicemail requesting a call back.  [place] MSW LCSW 03/10/2019, 10:14 AM

## 2019-03-10 NOTE — Progress Notes (Signed)
Blood - Glucose level this evening was 143

## 2019-03-10 NOTE — Progress Notes (Signed)
[facility] MD Progress Note  03/10/2019 6:02 PM [name]  MRN:  [id number] Subjective: Follow-up for this patient who came into the hospital referred after taking an overdose of Xanax.  Patient recalls that she had some suicidal thoughts because she was feeling very stressed by her current living situation.  On interview today she says her mood is feeling significantly better.  She denies any current suicidal thoughts.  Denies psychotic symptoms.  Able to articulate appropriate plans for the future.  Patient has been put back on her usual medicine and is tolerating it well.  No behavior problems in the hospital. Principal Problem: MDD (major depressive disorder), severe ([place]) Diagnosis: Principal Problem:   MDD (major depressive disorder), severe ([place]) Active Problems:   Hypertension associated with diabetes ([place])   Diabetes mellitus, type II ([place])  Total Time spent with patient: 30 minutes  Past Psychiatric History: Past history of similar situation including a prior hospitalization in 2016 chronic anxiety and depression  Past Medical History:  Past Medical History:  Diagnosis Date  . Anemia   . Anxiety   . Asthmatic bronchitis    normal PFT/ seen by pulmonary no evidence of COPD  . Atypical chest pain 10/05/2017  . Chest pain on respiration 03/25/2014  . Chronic bronchitis ([place])   . Chronic lower back pain   . Chronic respiratory failure ([place]) 10/16/2011   Newly 02 dep 24/7 p discharge from [facility] 01/2013  - 03/13/2014  Walked RA  2 laps @ 185 ft each stopped due to  Sob/ aching in legs, thirsty/ no desat @ slow pace   . Chronic respiratory failure with hypoxia (HCC)    On 2-3 L of oxygen at home  . Cigarette smoker 12/02/2010   Followed in Pulmonary clinic/ [facility]/ [place]   - Limits of effective care reviewed 12/22/2011    . COPD (chronic obstructive pulmonary disease) ([place])   . COPD with chronic bronchitis ([place]) 10/01/2017  . Daily headache   . Depression   .  Diabetic peripheral neuropathy ([place])   . DM (diabetes mellitus) type II controlled, neurological manifestation ([place]) 12/02/2010  . Gastric erosions    EGD 08/2010.  [name] GERD (gastroesophageal reflux disease)   . H/O drug abuse ([place]) 11/12/2017   -- scanned document from outside source: [facility] in [place] which showed UDS positive for Adderall /amphetamine usage as well as positive urine for THC.  This test result was collected 08/11/2016 and reported 10/07/2016. - lso review of the chart shows another positive amphetamine, THC and METH in the urine back on 10/18/2015 under "care everywhere". ---So   . Heavy menses   . High cholesterol   . History of blood transfusion    "related to low HgB" (10/05/2017)  . History of hiatal hernia   . HTN (hypertension)   . Hyperlipidemia associated with type 2 diabetes mellitus ([place]) 10/18/2015  . Hypertension associated with diabetes ([place]) 12/02/2010   D/c acei 12/22/2011 due to psuedowheeze and narcotic dependent cough> ? Improved - [id number] started bystolic in place of cozar due to cough    . Increased urinary protein excretion   . Internal hemorrhoids    Colonoscopy 5/12.  . Iron deficiency anemia 10/01/2017  . Mixed diabetic hyperlipidemia associated with type 2 diabetes mellitus ([place]) 10/01/2017  . On home oxygen therapy    "5L at night" (10/05/2017)  . OSA on CPAP   . Osteoarthritis    "back" (10/05/2017)  .  Oxygen dependent 10/16/2011  . Pneumonia    "lots of times" (10/05/2017)  . Poorly controlled diabetes mellitus ([place]) 11/12/2017  . Pulmonary infiltrates 12/22/2011   Followed in Pulmonary clinic/ [facility]/ [place]    - See CT Chest 05/05/11   . Shortness of breath   . Tachycardia    never had test done since no insurance  . Tobacco use disorder-current smoker greater than 40-pack-year history- since age 57 1 ppd 10/01/2017  . Type II diabetes mellitus ([place])     Past Surgical History:  Procedure  Laterality Date  . CESAREAN SECTION  1988; 1989  . CHOLECYSTECTOMY OPEN  1990  . COLONOSCOPY  09/16/2010   [id number] colon/small internal hemorrhoids  . ESOPHAGOGASTRODUODENOSCOPY  09/16/2010   SLF: normal/mild gastritis  . ESOPHAGOGASTRODUODENOSCOPY N/A 10/19/2014   Procedure: ESOPHAGOGASTRODUODENOSCOPY (EGD);  Surgeon: Danie Binder, MD;  Location: [facility]ITE;  Service: Endoscopy;  Laterality: N/A;  830  . FRACTURE SURGERY    . HYSTEROSCOPY WITH THERMACHOICE  01/17/2012   Procedure: HYSTEROSCOPY WITH THERMACHOICE;  Surgeon: Florian Buff, MD;  Location: [facility] ORS;  Service: Gynecology;  Laterality: N/A;  total therapy time: 9:13sec  D5W  18 ml in, D5W   12ml out, temperture 87degrees celcious  . KIDNEY SURGERY     as child for blockages  . TONSILLECTOMY    . TUBAL LIGATION  1989  . TYMPANOSTOMY TUBE PLACEMENT Bilateral    "several times when I was a child"  . uterine ablation    . WRIST FRACTURE SURGERY Left 1995   Family History:  Family History  Problem Relation Age of Onset  . Heart attack Father 9       deceased, etoh use  . Heart disease Father   . Alcohol abuse Father   . Depression Father   . Heart failure Father   . Heart attack Mother 33       deceased  . Diabetes Mother   . Breast cancer Mother   . Heart failure Mother        oxygen dependence, nonsmoker  . Heart disease Mother   . Depression Mother   . Cancer Mother   . Liver disease Maternal Aunt 75       died while on liver transplant list  . Heart attack Maternal Grandmother        premature CAD  . Ulcers Sister   . Hypertension Sister   . Heart failure Sister   . Colon cancer Neg Hx    Family Psychiatric  History: See previous Social History:  Social History   Substance and Sexual Activity  Alcohol Use Yes   Comment: 10/05/2017 "5 - 8 shots tequilia//month"     Social History   Substance and Sexual Activity  Drug Use Yes  . Types: Marijuana   Comment: 10/05/2017 "a few times/wk"    Social  History   Socioeconomic History  . Marital status: Married    Spouse name: Not on file  . Number of children: 2  . Years of education: Not on file  . Highest education level: Not on file  Occupational History  . Occupation: unemployed  Social Needs  . Financial resource strain: Not on file  . Food insecurity    Worry: Not on file    Inability: Not on file  . Transportation needs    Medical: Not on file    Non-medical: Not on file  Tobacco Use  . Smoking status: Current Every Day Smoker  Packs/day: 1.00    Years: 39.00    Pack years: 39.00    Types: Cigarettes  . Smokeless tobacco: Never Used  Substance and Sexual Activity  . Alcohol use: Yes    Comment: 10/05/2017 "5 - 8 shots tequilia//month"  . Drug use: Yes    Types: Marijuana    Comment: 10/05/2017 "a few times/wk"  . Sexual activity: Yes    Partners: Male    Birth control/protection: Surgical  Lifestyle  . Physical activity    Days per week: Not on file    Minutes per session: Not on file  . Stress: Not on file  Relationships  . Social Herbalist on phone: Not on file    Gets together: Not on file    Attends religious service: Not on file    Active member of club or organization: Not on file    Attends meetings of clubs or organizations: Not on file    Relationship status: Not on file  Other Topics Concern  . Not on file  Social History Narrative  . Not on file   Additional Social History:    History of alcohol / drug use?: No history of alcohol / drug abuse                    Sleep: Fair  Appetite:  Fair  Current Medications: Current Facility-Administered Medications  Medication Dose Route Frequency Provider Last Rate Last Dose  . acetaminophen (TYLENOL) tablet 650 mg  650 mg Oral Q6H PRN Money, Lowry Ram, FNP   650 mg at 03/10/19 1457  . acidophilus (RISAQUAD) capsule 1 capsule  1 capsule Oral Daily Sharma Covert, MD   1 capsule at 03/10/19 [id number]  . albuterol (VENTOLIN HFA)  108 (90 Base) MCG/ACT inhaler 1-2 puff  1-2 puff Inhalation Q6H PRN Sharma Covert, MD   2 puff at 03/10/19 1459  . alum & mag hydroxide-simeth (MAALOX/MYLANTA) 200-200-20 MG/5ML suspension 30 mL  30 mL Oral Q4H PRN Money, Lowry Ram, FNP      . aspirin EC tablet 81 mg  81 mg Oral Daily Sharma Covert, MD   81 mg at 03/10/19 [id number]  . atorvastatin (LIPITOR) tablet 80 mg  80 mg Oral q1800 Money, Darnelle Maffucci B, FNP   80 mg at 03/10/19 1705  . canagliflozin (INVOKANA) tablet 300 mg  300 mg Oral QAC breakfast Money, Darnelle Maffucci B, FNP      . diphenhydrAMINE (BENADRYL) capsule 25 mg  25 mg Oral Q6H PRN Sharma Covert, MD   25 mg at 03/09/19 2152  . DULoxetine (CYMBALTA) DR capsule 60 mg  60 mg Oral Daily Money, Lowry Ram, FNP   60 mg at 03/10/19 0806  . famotidine (PEPCID) tablet 20 mg  20 mg Oral BID Sharma Covert, MD   20 mg at 03/10/19 1704  . fluticasone furoate-vilanterol (BREO ELLIPTA) 200-25 MCG/INH 1 puff  1 puff Inhalation Daily Money, Lowry Ram, FNP   1 puff at 03/10/19 0810  . guaiFENesin-dextromethorphan (ROBITUSSIN DM) 100-10 MG/5ML syrup 5 mL  5 mL Oral Q4H PRN Sharma Covert, MD   5 mL at 03/10/19 0813  . hydrocortisone cream 1 %   Topical Q8H PRN Sharma Covert, MD      . hydrOXYzine (ATARAX/VISTARIL) tablet 25 mg  25 mg Oral TID PRN Sharma Covert, MD      . influenza vac split quadrivalent PF (FLUARIX) injection 0.5 mL  0.5 mL  Intramuscular Tomorrow-1000 Adryanna Friedt T, MD      . levofloxacin [facility]) tablet 500 mg  500 mg Oral Daily Sharma Covert, MD   500 mg at 03/10/19 0809  . loratadine (CLARITIN) tablet 10 mg  10 mg Oral Daily Sharma Covert, MD   10 mg at 03/10/19 0810  . LORazepam (ATIVAN) tablet 1 mg  1 mg Oral Q6H PRN Sharma Covert, MD      . losartan (COZAAR) tablet 25 mg  25 mg Oral Daily Money, Lowry Ram, FNP   25 mg at 03/10/19 0809  . magnesium hydroxide (MILK OF MAGNESIA) suspension 30 mL  30 mL Oral Daily PRN Money, Darnelle Maffucci B, FNP      .  nicotine (NICODERM CQ - dosed in mg/24 hours) patch 21 mg  21 mg Transdermal Daily Money, Lowry Ram, FNP   21 mg at 03/10/19 0816  . pantoprazole (PROTONIX) EC tablet 40 mg  40 mg Oral Daily Money, Lowry Ram, FNP   40 mg at 03/10/19 [id number]  . pneumococcal 23 valent vaccine (PNEUMOVAX-23) injection 0.5 mL  0.5 mL Intramuscular Tomorrow-1000 Maggie Dworkin T, MD      . pregabalin (LYRICA) capsule 150 mg  150 mg Oral BID Carver Murakami T, MD   150 mg at 03/10/19 1705  . umeclidinium bromide (INCRUSE ELLIPTA) 62.5 MCG/INH 1 puff  1 puff Inhalation Daily Money, Travis B, FNP   1 puff at 03/10/19 0818    Lab Results:  Results for orders placed or performed during the hospital encounter of 03/07/19 (from the past 48 hour(s))  Glucose, capillary     Status: Abnormal   Collection Time: 03/08/19  8:50 PM  Result Value Ref Range   Glucose-Capillary 149 (H) 70 - 99 mg/dL   Comment 1 Notify RN   Glucose, capillary     Status: Abnormal   Collection Time: 03/09/19  7:04 AM  Result Value Ref Range   Glucose-Capillary 191 (H) 70 - 99 mg/dL   Comment 1 Notify RN   Glucose, capillary     Status: Abnormal   Collection Time: 03/09/19 11:17 AM  Result Value Ref Range   Glucose-Capillary 150 (H) 70 - 99 mg/dL  Glucose, capillary     Status: Abnormal   Collection Time: 03/09/19  4:19 PM  Result Value Ref Range   Glucose-Capillary 131 (H) 70 - 99 mg/dL  Glucose, capillary     Status: Abnormal   Collection Time: 03/09/19  9:10 PM  Result Value Ref Range   Glucose-Capillary 192 (H) 70 - 99 mg/dL  Glucose, capillary     Status: Abnormal   Collection Time: 03/10/19  7:02 AM  Result Value Ref Range   Glucose-Capillary 154 (H) 70 - 99 mg/dL   Comment 1 Notify RN   Glucose, capillary     Status: Abnormal   Collection Time: 03/10/19 11:00 AM  Result Value Ref Range   Glucose-Capillary 159 (H) 70 - 99 mg/dL  Glucose, capillary     Status: Abnormal   Collection Time: 03/10/19  4:10 PM  Result Value Ref Range    Glucose-Capillary 129 (H) 70 - 99 mg/dL   Comment 1 Notify RN     Blood Alcohol level:  Lab Results  Component Value Date   ETH <10 03/04/2019   ETH <5 [id number]    Metabolic Disorder Labs: Lab Results  Component Value Date   HGBA1C 8.2 (H) 03/05/2019   MPG 188.64 03/05/2019   MPG 160 (H) 03/10/2015  No results found for: PROLACTIN Lab Results  Component Value Date   CHOL 131 02/12/2018   TRIG 89 03/06/2019   HDL 38 (L) 02/12/2018   CHOLHDL 3.4 02/12/2018   VLDL 35 10/06/2017   LDLCALC 74 02/12/2018   LDLCALC 132 (H) 10/06/2017    Physical Findings: AIMS: Facial and Oral Movements Muscles of Facial Expression: None, normal Lips and Perioral Area: None, normal Jaw: None, normal Tongue: None, normal,Extremity Movements Upper (arms, wrists, hands, fingers): None, normal Lower (legs, knees, ankles, toes): None, normal, Trunk Movements Neck, shoulders, hips: None, normal, Overall Severity Severity of abnormal movements (highest score from questions above): None, normal Incapacitation due to abnormal movements: None, normal Patient's awareness of abnormal movements (rate only patient's report): No Awareness, Dental Status Current problems with teeth and/or dentures?: No Does patient usually wear dentures?: No  CIWA:    COWS:     Musculoskeletal: Strength & Muscle Tone: within normal limits Gait & Station: normal Patient leans: N/A  Psychiatric Specialty Exam: Physical Exam  Nursing note and vitals reviewed. Constitutional: She appears well-developed and well-nourished.  HENT:  Head: Normocephalic and atraumatic.  Eyes: Pupils are equal, round, and reactive to light. Conjunctivae are normal.  Neck: Normal range of motion.  Cardiovascular: Regular rhythm and normal heart sounds.  Respiratory: Effort normal. No respiratory distress.  GI: Soft.  Musculoskeletal: Normal range of motion.  Neurological: She is alert.  Skin: Skin is warm and dry.  Psychiatric:  She has a normal mood and affect. Her speech is normal and behavior is normal. Judgment and thought content normal. Cognition and memory are normal.    Review of Systems  Constitutional: Negative.   HENT: Negative.   Eyes: Negative.   Respiratory: Negative.   Cardiovascular: Negative.   Gastrointestinal: Negative.   Musculoskeletal: Negative.   Skin: Negative.   Neurological: Negative.   Psychiatric/Behavioral: Negative.     Blood pressure 122/63, pulse 91, temperature 97.8 F (36.6 C), temperature source Oral, resp. rate 18, height 5\' 4"  (1.626 m), weight 118.8 kg, SpO2 97 %.Body mass index is 44.97 kg/m.  General Appearance: Casual  Eye Contact:  Good  Speech:  Clear and Coherent  Volume:  Normal  Mood:  Euthymic  Affect:  Congruent  Thought Process:  Goal Directed  Orientation:  Full (Time, Place, and Person)  Thought Content:  Logical  Suicidal Thoughts:  No  Homicidal Thoughts:  No  Memory:  Immediate;   Fair Recent;   Fair Remote;   Fair  Judgement:  Fair  Insight:  Fair  Psychomotor Activity:  Normal  Concentration:  Concentration: Fair  Recall:  [employer] of Knowledge:  Fair  Language:  Fair  Akathisia:  No  Handed:  Right  AIMS (if indicated):     Assets:  Desire for Improvement Housing Physical Health  ADL's:  Intact  Cognition:  WNL  Sleep:  Number of Hours: 7.75     Treatment Plan Summary: Daily contact with patient to assess and evaluate symptoms and progress in treatment, Medication management and Plan Continue current medication.  Reviewed coping skills and outpatient treatment plan.  Supportive and educational counseling.  Patient can be discharged tomorrow and will follow-up in [place].  She is no longer on the Xanax and it is recommended that she not restart it.  Alethia Berthold, MD 03/10/2019, 6:02 PM

## 2019-03-11 LAB — GLUCOSE, CAPILLARY
Glucose-Capillary: 143 mg/dL — ABNORMAL HIGH (ref 70–99)
Glucose-Capillary: 150 mg/dL — ABNORMAL HIGH (ref 70–99)
Glucose-Capillary: 148 mg/dL — ABNORMAL HIGH (ref 70–99)

## 2019-03-11 MED ORDER — LEVOFLOXACIN 500 MG PO TABS: 500.0000 mg | ORAL_TABLET | Freq: Every day | ORAL | 0 refills | Status: DC

## 2019-03-11 MED ORDER — FAMOTIDINE 20 MG PO TABS: 20.0000 mg | ORAL_TABLET | Freq: Two times a day (BID) | ORAL | 0 refills | Status: DC

## 2019-03-11 MED ORDER — HYDROXYZINE HCL 25 MG PO TABS: 25.0000 mg | ORAL_TABLET | Freq: Three times a day (TID) | ORAL | 0 refills | Status: DC | PRN

## 2019-03-11 NOTE — Discharge Summary (Signed)
Physician Discharge Summary Note  Patient:  [name] is an 53 y.o., female MRN:  [id number] DOB:  October 10, 1965 Patient phone:  [phone] (home)  Patient address:   [place] [address],  Total Time spent with patient: 30 minutes  Date of Admission:  03/07/2019 Date of Discharge: 03/11/2019  Reason for Admission:  Patient is seen and examined. Patient is a 53 year old female with a past psychiatric history significant for major depression, generalized anxiety disorder as well as attention deficit disorder who originally presented to the [facility] emergency department on 03/05/2019 after an intentional overdose of anywhere between 15-20 Xanax. She was admitted to the hospital and evaluated. She was evaluated by the consult service at [facility], and decision was made to admit her to the hospital for evaluation and stabilization. The patient stated that she had been doing well until recently. She stated that she and her new husband had bought a home in March, and several of their family members had moved in. After their family members had moved and some boyfriends and girlfriends of those family members moved in. The agreement was that they would pay rent, and be able to stay in the home. Unfortunately during the course of time it became increasingly more difficult to collect rent from the boyfriend's etc. She stated that this increased psychosocial stress from the financial and emotional standpoint became overwhelming for her. She is followed by Dr. Harrington Challenger in [place], and is prescribed Xanax. She stated she often will not take the Xanax and that was the reason she had so many tablets available. Unfortunately this is the second intentional overdose of Xanax that she has in her past. In 2016 she was admitted to the behavioral health hospital in [place]. It again was secondary to an intentional overdose of Xanax. Her discharge  medications at that time included Abilify, Cymbalta, Lyrica. She last saw Dr. Harrington Challenger on 01/13/2019. She had no complaints at that time. Her psychiatric medications included Xanax, Adderall, Cymbalta. She admitted to feeling overwhelmed, worsening depression, and worsening anxiety. She was admitted to the hospital for evaluation and stabilization.  Associated Signs/Symptoms: Depression Symptoms:  depressed mood, anhedonia, insomnia, psychomotor agitation, fatigue, feelings of worthlessness/guilt, difficulty concentrating, hopelessness, suicidal thoughts without plan, anxiety, loss of energy/fatigue, (Hypo) Manic Symptoms:  Distractibility, Impulsivity, Irritable Mood, Anxiety Symptoms:  Excessive Worry, Psychotic Symptoms:  denied PTSD Symptoms: Negative   Past Psychiatric History: Patient had 1 previous overdose in 2016 similar to this event.  She is followed by Dr. Harrington Challenger as an outpatient.  She is prescribed Xanax, Adderall, Cymbalta.  She also previously had received Abilify for augmentation of antidepressant treatment.  Principal Problem: MDD (major depressive disorder), severe ([place]) Discharge Diagnoses: Principal Problem:   MDD (major depressive disorder), severe ([place]) Active Problems:   Hypertension associated with diabetes ([place])   Diabetes mellitus, type II ([place])   Past Psychiatric History: Father with depression and alcohol abuse.  Mother with depression.  Past Medical History:  Past Medical History:  Diagnosis Date  . Anemia   . Anxiety   . Asthmatic bronchitis    normal PFT/ seen by pulmonary no evidence of COPD  . Atypical chest pain 10/05/2017  . Chest pain on respiration 03/25/2014  . Chronic bronchitis ([place])   . Chronic lower back pain   . Chronic respiratory failure ([place]) 10/16/2011   Newly 02 dep 24/7 p discharge from [facility] 01/2013  - 03/13/2014  Walked RA  2 laps @ 185 ft  each stopped due to  Sob/ aching in legs, thirsty/ no desat @ slow pace   . Chronic  respiratory failure with hypoxia (HCC)    On 2-3 L of oxygen at home  . Cigarette smoker 12/02/2010   Followed in Pulmonary clinic/ [facility]/ [place]   - Limits of effective care reviewed 12/22/2011    . COPD (chronic obstructive pulmonary disease) ([place])   . COPD with chronic bronchitis ([place]) 10/01/2017  . Daily headache   . Depression   . Diabetic peripheral neuropathy ([place])   . DM (diabetes mellitus) type II controlled, neurological manifestation ([place]) 12/02/2010  . Gastric erosions    EGD 08/2010.  [name] GERD (gastroesophageal reflux disease)   . H/O drug abuse ([place]) 11/12/2017   -- scanned document from outside source: [facility] in [place] which showed UDS positive for Adderall /amphetamine usage as well as positive urine for THC.  This test result was collected 08/11/2016 and reported 10/07/2016. - lso review of the chart shows another positive amphetamine, THC and METH in the urine back on 10/18/2015 under "care everywhere". ---So   . Heavy menses   . High cholesterol   . History of blood transfusion    "related to low HgB" (10/05/2017)  . History of hiatal hernia   . HTN (hypertension)   . Hyperlipidemia associated with type 2 diabetes mellitus ([place]) 10/18/2015  . Hypertension associated with diabetes ([place]) 12/02/2010   D/c acei 12/22/2011 due to psuedowheeze and narcotic dependent cough> ? Improved - [id number] started bystolic in place of cozar due to cough    . Increased urinary protein excretion   . Internal hemorrhoids    Colonoscopy 5/12.  . Iron deficiency anemia 10/01/2017  . Mixed diabetic hyperlipidemia associated with type 2 diabetes mellitus ([place]) 10/01/2017  . On home oxygen therapy    "5L at night" (10/05/2017)  . OSA on CPAP   . Osteoarthritis    "back" (10/05/2017)  . Oxygen dependent 10/16/2011  . Pneumonia    "lots of times" (10/05/2017)  . Poorly controlled diabetes mellitus ([place]) 11/12/2017  . Pulmonary infiltrates 12/22/2011    Followed in Pulmonary clinic/  [facility]/ [place]    - See CT Chest 05/05/11   . Shortness of breath   . Tachycardia    never had test done since no insurance  . Tobacco use disorder-current smoker greater than 40-pack-year history- since age 9 1 ppd 10/01/2017  . Type II diabetes mellitus ([place])     Past Surgical History:  Procedure Laterality Date  . CESAREAN SECTION  1988; 1989  . CHOLECYSTECTOMY OPEN  1990  . COLONOSCOPY  09/16/2010   [id number] colon/small internal hemorrhoids  . ESOPHAGOGASTRODUODENOSCOPY  09/16/2010   SLF: normal/mild gastritis  . ESOPHAGOGASTRODUODENOSCOPY N/A 10/19/2014   Procedure: ESOPHAGOGASTRODUODENOSCOPY (EGD);  Surgeon: Danie Binder, MD;  Location: [facility] ENDO SUITE;  Service: Endoscopy;  Laterality: N/A;  830  . FRACTURE SURGERY    . HYSTEROSCOPY WITH THERMACHOICE  01/17/2012   Procedure: HYSTEROSCOPY WITH THERMACHOICE;  Surgeon: Florian Buff, MD;  Location: [facility] ORS;  Service: Gynecology;  Laterality: N/A;  total therapy time: 9:13sec  D5W  18 ml in, D5W   83ml out, temperture 87degrees celcious  . KIDNEY SURGERY     as child for blockages  . TONSILLECTOMY    . TUBAL LIGATION  1989  . TYMPANOSTOMY TUBE PLACEMENT Bilateral    "several times when I was a child"  . uterine ablation    .  WRIST FRACTURE SURGERY Left 1995   Family History:  Family History  Problem Relation Age of Onset  . Heart attack Father 53       deceased, etoh use  . Heart disease Father   . Alcohol abuse Father   . Depression Father   . Heart failure Father   . Heart attack Mother 36       deceased  . Diabetes Mother   . Breast cancer Mother   . Heart failure Mother        oxygen dependence, nonsmoker  . Heart disease Mother   . Depression Mother   . Cancer Mother   . Liver disease Maternal Aunt 52       died while on liver transplant list  . Heart attack Maternal Grandmother        premature CAD  . Ulcers Sister   . Hypertension Sister   . Heart failure Sister   .  Colon cancer Neg Hx    Family Psychiatric  History: Father with depression and alcohol abuse.  Mother with depression. Social History:  Social History   Substance and Sexual Activity  Alcohol Use Yes   Comment: 10/05/2017 "5 - 8 shots tequilia//month"     Social History   Substance and Sexual Activity  Drug Use Yes  . Types: Marijuana   Comment: 10/05/2017 "a few times/wk"    Social History   Socioeconomic History  . Marital status: Married    Spouse name: Not on file  . Number of children: 2  . Years of education: Not on file  . Highest education level: Not on file  Occupational History  . Occupation: unemployed  Social Needs  . Financial resource strain: Not on file  . Food insecurity    Worry: Not on file    Inability: Not on file  . Transportation needs    Medical: Not on file    Non-medical: Not on file  Tobacco Use  . Smoking status: Current Every Day Smoker    Packs/day: 1.00    Years: 39.00    Pack years: 39.00    Types: Cigarettes  . Smokeless tobacco: Never Used  Substance and Sexual Activity  . Alcohol use: Yes    Comment: 10/05/2017 "5 - 8 shots tequilia//month"  . Drug use: Yes    Types: Marijuana    Comment: 10/05/2017 "a few times/wk"  . Sexual activity: Yes    Partners: Male    Birth control/protection: Surgical  Lifestyle  . Physical activity    Days per week: Not on file    Minutes per session: Not on file  . Stress: Not on file  Relationships  . Social Herbalist on phone: Not on file    Gets together: Not on file    Attends religious service: Not on file    Active member of club or organization: Not on file    Attends meetings of clubs or organizations: Not on file    Relationship status: Not on file  Other Topics Concern  . Not on file  Social History Narrative  . Not on file    Hospital Course:  [place] was admitted for MDD (major depressive disorder), severe ([place]) and crisis management.  She was  treated with the following medications Duloxetine 60mg  po daily for depression, Hydroxyzine 25mg  po TID prn for anxiety.  Linnaea A [name]-Weckerly was discharged with current medication and was instructed on how to take medications as prescribed; (  details listed below under Medication List).  Medical problems were identified and treated as needed.  Home medications were restarted as appropriate.She was started on Levaquin 500mg  po COPD exacerbation.   Improvement was monitored by observation and Randie A [name]-Lape daily report of symptom reduction.  Emotional and mental status was monitored by daily self-inventory reports completed by Morena A [name]-Wellnitz and clinical staff.         Chelle A [name]-Schoen was evaluated by the treatment team for stability and plans for continued recovery upon discharge.  Rashanna A [name]-Bacigalupo motivation was an integral factor for scheduling further treatment.  Employment, transportation, bed availability, health status, family support, and any pending legal issues were also considered during her hospital stay.  She was offered further treatment options upon discharge including but not limited to Residential, Intensive Outpatient, and Outpatient treatment.  Anam A [name]-Devin will follow up with the services as listed below under Follow Up Information.     Upon completion of this admission the Ninah A [name]-Harding was both mentally and medically stable for discharge denying suicidal/homicidal ideation, auditory/visual/tactile hallucinations, delusional thoughts and paranoia.      Physical Findings: AIMS: Facial and Oral Movements Muscles of Facial Expression: None, normal Lips and Perioral Area: None, normal Jaw: None, normal Tongue: None, normal,Extremity Movements Upper (arms, wrists, hands, fingers): None, normal Lower (legs, knees, ankles, toes): None, normal, Trunk Movements Neck, shoulders, hips: None, normal, Overall  Severity Severity of abnormal movements (highest score from questions above): None, normal Incapacitation due to abnormal movements: None, normal Patient's awareness of abnormal movements (rate only patient's report): No Awareness, Dental Status Current problems with teeth and/or dentures?: No Does patient usually wear dentures?: No  CIWA:    COWS:     Musculoskeletal: Strength & Muscle Tone: within normal limits Gait & Station: normal Patient leans: N/A  Psychiatric Specialty Exam:See MD SRA Physical Exam  ROS  Blood pressure (!) 169/83, pulse 85, temperature 98.3 F (36.8 C), temperature source Oral, resp. rate 18, height 5\' 4"  (1.626 m), weight 118.8 kg, SpO2 98 %.Body mass index is 44.97 kg/m.  Sleep:  Number of Hours: 6.75     Have you used any form of tobacco in the last 30 days? (Cigarettes, Smokeless Tobacco, Cigars, and/or Pipes): Yes  Has this patient used any form of tobacco in the last 30 days? (Cigarettes, Smokeless Tobacco, Cigars, and/or Pipes)  No  Blood Alcohol level:  Lab Results  Component Value Date   ETH <10 03/04/2019   ETH <5 [id number]    Metabolic Disorder Labs:  Lab Results  Component Value Date   HGBA1C 8.2 (H) 03/05/2019   MPG 188.64 03/05/2019   MPG 160 (H) 03/10/2015   No results found for: PROLACTIN Lab Results  Component Value Date   CHOL 131 02/12/2018   TRIG 89 03/06/2019   HDL 38 (L) 02/12/2018   CHOLHDL 3.4 02/12/2018   VLDL 35 10/06/2017   LDLCALC 74 02/12/2018   LDLCALC 132 (H) 10/06/2017    See Psychiatric Specialty Exam and Suicide Risk Assessment completed by Attending Physician prior to discharge.  Discharge destination:  Home  Is patient on multiple antipsychotic therapies at discharge:  No   Has Patient had three or more failed trials of antipsychotic monotherapy by history:  No  Recommended Plan for Multiple Antipsychotic Therapies: NA  Discharge Instructions    Discharge instructions   Complete by: As  directed    Please continue to take medications as directed. If your symptoms  return, worsen, or persist please call your 911, report to local ER, or contact crisis hotline. Please do not drink alcohol or use any illegal substances while taking prescription medications.     Allergies as of 03/11/2019      Reactions   Codeine Itching, Nausea Only   Metformin And Related Diarrhea, Other (See Comments)   Stomach pain/nausea   Wellbutrin [bupropion Hcl] Hives   Acyclovir And Related Rash      Medication List    STOP taking these medications   acetaminophen 500 MG tablet Commonly known as: TYLENOL   amphetamine-dextroamphetamine 30 MG tablet Commonly known as: Adderall   dicyclomine 10 MG capsule Commonly known as: BENTYL   ibuprofen 800 MG tablet Commonly known as: ADVIL   meclizine 25 MG tablet Commonly known as: ANTIVERT   polyvinyl alcohol 1.4 % ophthalmic solution Commonly known as: LIQUIFILM TEARS   Vitamin D (Ergocalciferol) 1.25 MG (50000 UT) Caps capsule Commonly known as: DRISDOL     TAKE these medications     Indication  albuterol (2.5 MG/3ML) 0.083% nebulizer solution Commonly known as: PROVENTIL Take 3 mLs (2.5 mg total) by nebulization every 6 (six) hours as needed for wheezing or shortness of breath.  Indication: Spasm of Lung Air Passages   aspirin EC 81 MG tablet Take 1 tablet (81 mg total) by mouth daily.  Indication: Inflammation   atorvastatin 80 MG tablet Commonly known as: LIPITOR Take 1 tablet (80 mg total) by mouth daily.  Indication: High Amount of Fats in the Blood   BD Pen Needle Nano U/F 32G X 4 MM Misc Generic drug: Insulin Pen Needle USE AS DIRECTED WITH VICTOZA  Indication: use as directed with Victoza   canagliflozin 300 MG Tabs tablet Commonly known as: Invokana Take 1 tablet (300 mg total) by mouth daily before breakfast.  Indication: Type 2 Diabetes   DULoxetine 60 MG capsule Commonly known as: CYMBALTA TAKE 1 CAPSULE(60  MG) BY MOUTH AT BEDTIME What changed:   how much to take  how to take this  when to take this  Indication: Major Depressive Disorder   famotidine 20 MG tablet Commonly known as: PEPCID Take 1 tablet (20 mg total) by mouth 2 (two) times daily.  Indication: Gastroesophageal Reflux Disease   fluticasone 50 MCG/ACT nasal spray Commonly known as: FLONASE USE 1 SPRAY IN EACH NOSTRIL TWICE DAILY AFTER SINUS RINSES What changed:   how much to take  how to take this  when to take this  Indication: Signs and Symptoms of Nose Diseases   Fluticasone-Salmeterol 250-50 MCG/DOSE Aepb Commonly known as: Advair Diskus Inhale 1 puff into the lungs 2 (two) times daily. Flush mouth after each use- NO RF's NEEDS OV  Indication: Asthma   hydrOXYzine 25 MG tablet Commonly known as: ATARAX/VISTARIL Take 1 tablet (25 mg total) by mouth 3 (three) times daily as needed for anxiety.  Indication: Feeling Anxious   isosorbide mononitrate 30 MG 24 hr tablet Commonly known as: IMDUR TAKE 1 TABLET BY MOUTH EVERY DAY  Indication: Stable Angina Pectoris   levofloxacin 500 MG tablet Commonly known as: LEVAQUIN Take 1 tablet (500 mg total) by mouth daily. Start taking on: March 12, 2019  Indication: Infection caused by Bacteria   losartan 25 MG tablet Commonly known as: COZAAR Take 1 tablet (25 mg total) by mouth daily.  Indication: High Blood Pressure Disorder   nicotine 21 mg/24hr patch Commonly known as: NICODERM CQ - dosed in mg/24 hours Place 1 patch (  21 mg total) onto the skin daily.  Indication: Nicotine Addiction   pantoprazole 40 MG tablet Commonly known as: Protonix Take 1 tablet (40 mg total) by mouth daily. 30 minutes before a meal.  Indication: Gastroesophageal Reflux Disease   pregabalin 150 MG capsule Commonly known as: LYRICA Take 1 capsule (150 mg total) by mouth 2 (two) times daily. No ReFills  Indication: Diabetes with Nerve Disease   Victoza 18 MG/3ML  Sopn Generic drug: liraglutide INJECT 0.3 MLS (1.8 MG TOTAL) INTO THE SKIN DAILY. What changed:   how much to take  how to take this  when to take this  Indication: Type 2 Diabetes      Follow-up [place] at [facility] Follow up on 03/19/2019.   Why: You are scheduled to meet with Dr. Harrington Challenger via zoom or phone call on Wednesday, November 25th at 8:20am. Thank you. Contact information: [address]  [place], [place] [address] [phone] Fax: [phone]           Follow-up recommendations:  Activity:  Increase activity as tolerated Diet:  Routine diet as directed Tests:  Routine testing as directed Other:  Even if you begin to feel better continue taking your medicaitons.    Signed: Suella Broad, FNP 03/11/2019, 11:48 AM

## 2019-03-11 NOTE — Progress Notes (Signed)
  [facility] Adult Case Management Discharge Plan :  Will you be returning to the same living situation after discharge:  Yes,  pt reports that she is returning home. At discharge, do you have transportation home?: Yes,  pt reports that daughter will provide transportation. Do you have the ability to pay for your medications: Yes,  [employer] Medicare  Release of information consent forms completed and in the chart;  Patient's signature needed at discharge.  Patient to Follow up at: Follow-up [place] at [place] Follow up on 03/19/2019.   Why: You are scheduled to meet with Dr. Harrington Challenger via zoom or phone call on Wednesday, November 25th at 8:20am. Thank you. Contact information: [address]  [place], [place] [address] [phone] Fax: [phone]           Next level of care provider has access to [place] and Suicide Prevention discussed: Yes,  SPE completed with husband  Have you used any form of tobacco in the last 30 days? (Cigarettes, Smokeless Tobacco, Cigars, and/or Pipes): Yes  Has patient been referred to the Quitline?: Patient refused referral  Patient has been referred for addiction treatment: Pt. refused referral  Rozann Lesches, LCSW 03/11/2019, 10:19 AM

## 2019-03-11 NOTE — BHH Group Notes (Signed)

## 2019-03-11 NOTE — Plan of Care (Signed)
  Problem: Group Participation Goal: STG - Patient will engage in groups without prompting or encouragement from LRT x3 group sessions within 5 recreation therapy group sessions Description: STG - Patient will engage in groups without prompting or encouragement from LRT x3 group sessions within 5 recreation therapy group sessions 03/11/2019 1212 by Ernest Haber, LRT Outcome: Adequate for Discharge 03/11/2019 1212 by Ernest Haber, LRT Outcome: Adequate for Discharge

## 2019-03-11 NOTE — Progress Notes (Signed)
D: Patient is aware of  Discharge this shift .  A:Patient denies suicidal /homicidal ideations. Patient received all belongings brought in   No Storage medications. Writer reviewed Discharge Summary, Suicide Risk Assessment, and Transitional Record. Patient also received Prescriptions   from  MD.  Aware  Of follow up appointment .  R: Patient left unit with no questions  Or concerns  With family

## 2019-03-11 NOTE — BHH Suicide Risk Assessment (Signed)
[facility] Discharge Suicide Risk Assessment   Principal Problem: MDD (major depressive disorder), severe ([place]) Discharge Diagnoses: Principal Problem:   MDD (major depressive disorder), severe ([place]) Active Problems:   Hypertension associated with diabetes ([place])   Diabetes mellitus, type II ([place])   Total Time spent with patient: 30 minutes  Musculoskeletal: Strength & Muscle Tone: within normal limits Gait & Station: normal Patient leans: N/A  Psychiatric Specialty Exam: Review of Systems  Constitutional: Negative.   HENT: Negative.   Eyes: Negative.   Respiratory: Negative.   Cardiovascular: Negative.   Gastrointestinal: Negative.   Musculoskeletal: Negative.   Skin: Negative.   Neurological: Negative.   Psychiatric/Behavioral: Negative.     Blood pressure (!) 169/83, pulse 85, temperature 98.3 F (36.8 C), temperature source Oral, resp. rate 18, height 5\' 4"  (1.626 m), weight 118.8 kg, SpO2 98 %.Body mass index is 44.97 kg/m.  General Appearance: Casual  Eye Contact::  Good  Speech:  Clear and [id number]  Volume:  Normal  Mood:  Euthymic  Affect:  Congruent  Thought Process:  Goal Directed  Orientation:  Full (Time, Place, and Person)  Thought Content:  Logical  Suicidal Thoughts:  No  Homicidal Thoughts:  No  Memory:  Immediate;   Fair Recent;   Fair Remote;   Fair  Judgement:  Fair  Insight:  Fair  Psychomotor Activity:  Normal  Concentration:  Fair  Recall:  [employer] of Knowledge:Fair  Language: Fair  Akathisia:  No  Handed:  Right  AIMS (if indicated):     Assets:  Desire for Improvement  Sleep:  Number of Hours: 6.75  Cognition: WNL  ADL's:  Intact   Mental Status Per Nursing Assessment::   On Admission:  Suicidal ideation indicated by patient, Self-harm thoughts, Suicidal ideation indicated by others, Suicide plan, Intention to act on suicide plan, Plan includes specific time, place, or method, Belief that plan would result in death  Demographic  Factors:  Caucasian  Loss Factors: Financial problems/change in socioeconomic status  Historical Factors: Impulsivity  Risk Reduction Factors:   Sense of responsibility to family, Religious beliefs about death, Living with another person, especially a relative and Positive therapeutic relationship  Continued Clinical Symptoms:  Dysthymia  Cognitive Features That Contribute To Risk:  None    Suicide Risk:  Minimal: No identifiable suicidal ideation.  Patients presenting with no risk factors but with morbid ruminations; may be classified as minimal risk based on the severity of the depressive symptoms  Follow-up [place] at [facility] Follow up on 03/19/2019.   Why: You are scheduled to meet with Dr. Harrington Challenger via zoom or phone call on Wednesday, November 25th at 8:20am. Thank you. Contact information: [address]  [place], [place] [address] [phone] Fax: [phone]           Plan Of Care/Follow-up recommendations:  Activity:  Activity as tolerated Diet:  Regular diet Other:  Follow-up with outpatient treatment in [place].  Discontinue use of Xanax.  Alethia Berthold, MD 03/11/2019, 11:11 AM

## 2019-03-11 NOTE — Plan of Care (Signed)
Patient said she is feeling better and is ready to go home tomorrow.   Problem: Education: Goal: Emotional status will improve Outcome: Progressing Goal: Mental status will improve Outcome: Progressing

## 2019-03-11 NOTE — Progress Notes (Signed)
Recreation Therapy Notes    Date: 03/11/2019  Time: 9:30 am  Location: Craft room  Behavioral response: Appropriate   Intervention Topic: Stress Management   Discussion/Intervention:   Group content on today was focused on stress. The group defined stress and way to cope with stress. Participants expressed how they know when they are stresses out. Individuals described the different ways they have to cope with stress. The group stated reasons why it is important to cope with stress. Patient explained what good stress is and some examples. The group participated in the intervention "Stress Management Jeopardy". Individuals were separated into two group and answered questions related to stress.  Clinical Observations/Feedback:  Patient came to group and explained that stress is normally affected by people and health. Individual was social with peers and staff while participating in the intervention.    Silena Wyss LRT/CTRS         Alexx Giambra 03/11/2019 11:43 AM

## 2019-03-11 NOTE — Plan of Care (Signed)
  Problem: Education: Goal: Knowledge of [facility] Education information/materials will improve Outcome: Adequate for Discharge Goal: Emotional status will improve Outcome: Adequate for Discharge Goal: Mental status will improve Outcome: Adequate for Discharge Goal: Verbalization of understanding the information provided will improve Outcome: Adequate for Discharge   Problem: Activity: Goal: Interest or engagement in activities will improve Outcome: Adequate for Discharge Goal: Sleeping patterns will improve Outcome: Adequate for Discharge   Problem: Coping: Goal: Ability to verbalize frustrations and anger appropriately will improve Outcome: Adequate for Discharge Goal: Ability to demonstrate self-control will improve Outcome: Adequate for Discharge   Problem: Coping: Goal: Ability to verbalize frustrations and anger appropriately will improve Outcome: Adequate for Discharge Goal: Ability to demonstrate self-control will improve Outcome: Adequate for Discharge   Problem: Health Behavior/Discharge Planning: Goal: Identification of resources available to assist in meeting health care needs will improve Outcome: Adequate for Discharge Goal: Compliance with treatment plan for underlying cause of condition will improve Outcome: Adequate for Discharge   Problem: Safety: Goal: Periods of time without injury will increase Outcome: Adequate for Discharge   Problem: Activity: Goal: Interest or engagement in leisure activities will improve Outcome: Adequate for Discharge Goal: Imbalance in normal sleep/wake cycle will improve Outcome: Adequate for Discharge   Problem: Coping: Goal: Coping ability will improve Outcome: Adequate for Discharge Goal: Will verbalize feelings Outcome: Adequate for Discharge   Problem: Health Behavior/Discharge Planning: Goal: Ability to make decisions will improve Outcome: Adequate for Discharge Goal: Compliance with therapeutic regimen  will improve Outcome: Adequate for Discharge   Problem: Safety: Goal: Ability to disclose and discuss suicidal ideas will improve Outcome: Adequate for Discharge Goal: Ability to identify and utilize support systems that promote safety will improve Outcome: Adequate for Discharge   Problem: Self-Concept: Goal: Will verbalize positive feelings about self Outcome: Adequate for Discharge Goal: Level of anxiety will decrease Outcome: Adequate for Discharge

## 2019-03-11 NOTE — Progress Notes (Signed)
Recreation Therapy Notes  INPATIENT RECREATION TR PLAN  Patient Details Name: [name] MRN: [id number] DOB: 08-09-1965 Today's Date: 03/11/2019  Rec Therapy Plan Is patient appropriate for Therapeutic Recreation?: Yes Treatment times per week: At least 3 Estimated Length of Stay: 5-7 days TR Treatment/Interventions: Group participation (Comment)  Discharge Criteria Pt will be discharged from therapy if:: Discharged Treatment plan/goals/alternatives discussed and agreed upon by:: Patient/family  Discharge Summary Short term goals set: Patient will engage in groups without prompting or encouragement from LRT x3 group sessions within 5 recreation therapy group sessions Short term goals met: Adequate for discharge Progress toward goals comments: Groups attended Which groups?: Stress management, Goal setting Reason goals not met: N/A Therapeutic equipment acquired: N/A Reason patient discharged from therapy: Discharge from hospital Pt/family agrees with progress & goals achieved: Yes Date patient discharged from therapy: 03/11/19   Miranda Garber 03/11/2019, 12:12 PM

## 2019-03-12 ENCOUNTER — Other Ambulatory Visit: Payer: Self-pay

## 2019-03-13 ENCOUNTER — Ambulatory Visit (INDEPENDENT_AMBULATORY_CARE_PROVIDER_SITE_OTHER): Payer: Medicare HMO

## 2019-03-13 ENCOUNTER — Ambulatory Visit: Payer: Medicare HMO | Admitting: Obstetrics & Gynecology

## 2019-03-13 DIAGNOSIS — Z23 Encounter for immunization: Secondary | ICD-10-CM

## 2019-03-13 DIAGNOSIS — N950 Postmenopausal bleeding: Secondary | ICD-10-CM | POA: Diagnosis not present

## 2019-03-13 NOTE — Progress Notes (Signed)
    Allana A [name]-Shankland 11/14/1965 [id number]        53 y.o.  [id number] Married.  S/P TL.  RP: PMB/S/P Endometrial Ablation for repeat Pelvic US  HPI: PMB in 10/2018 which led to a Pelvic US showing an Endometrial line at 5 mm. The EBx done was benign, but endometrial stroma/glands were not identified.   OB History  Gravida Para Term Preterm AB Living  2 2 2     2   SAB TAB Ectopic Multiple Live Births               # Outcome Date GA Lbr Len/2nd Weight Sex Delivery Anes PTL Lv  2 Term           1 Term             Past medical history,surgical history, problem list, medications, allergies, family history and social history were all reviewed and documented in the EPIC chart.   Directed ROS with pertinent positives and negatives documented in the history of present illness/assessment and plan.  Exam:  There were no vitals filed for this visit. General appearance:  Normal  Pelvic US today: T/V images.  Comparison is made with outside scan in August 2020.  The uterus is anteverted normal in size and shape with no myometrial mass.  The uterus is measured at 6.79 x 4.13 x 2.97 cm.  The endometrial lining is thin and symmetrical measured at 3.18 mm.  No mass or thickening are seen at the endometrial.  Both ovaries are identified, small with atrophic appearance.  No adnexal mass seen.  No free fluid in the posterior to the site.   Assessment/Plan:  53 y.o. G2P2002   1. Postmenopausal bleeding Postmenopausal bleeding in July 2020 with an endometrial lining measured at 5 mm and an endometrial biopsy that was benign but insufficient, with no stromal or glandular endometrial tissue identified.  The decision was therefore taken to repeat a pelvic ultrasound 3 months later.  Pelvic ultrasound today reviewed thoroughly with patient, the uterus is normal in size and shape with no myometrial mass and the endometrial lining is thin and symmetrical measured at 3.18 mm with no mass or thickening.   Both ovaries are small and atrophic in appearance.  No adnexal mass and no free fluid.  Patient was reassured.  Precautions discussed for recurrent postmenopausal bleeding.  Will observe.  2. Need for immunization against influenza - Flu Vaccine QUAD 36+ mos IM  Counseling on above issues and coordination of care more than 50% for 15 minutes.  [name] MD, 3:41 PM 03/13/2019

## 2019-03-19 ENCOUNTER — Telehealth (HOSPITAL_COMMUNITY): Payer: Self-pay | Admitting: Psychiatry

## 2019-03-19 ENCOUNTER — Ambulatory Visit (HOSPITAL_COMMUNITY): Payer: Medicare HMO | Admitting: Psychiatry

## 2019-03-19 ENCOUNTER — Other Ambulatory Visit: Payer: Self-pay

## 2019-03-23 ENCOUNTER — Encounter: Payer: Self-pay | Admitting: Obstetrics & Gynecology

## 2019-03-23 NOTE — Patient Instructions (Signed)
1. Postmenopausal bleeding Postmenopausal bleeding in July 2020 with an endometrial lining measured at 5 mm and an endometrial biopsy that was benign but insufficient, with no stromal or glandular endometrial tissue identified.  The decision was therefore taken to repeat a pelvic ultrasound 3 months later.  Pelvic ultrasound today reviewed thoroughly with patient, the uterus is normal in size and shape with no myometrial mass and the endometrial lining is thin and symmetrical measured at 3.18 mm with no mass or thickening.  Both ovaries are small and atrophic in appearance.  No adnexal mass and no free fluid.  Patient was reassured.  Precautions discussed for recurrent postmenopausal bleeding.  Will observe.  2. Need for immunization against influenza - Flu Vaccine QUAD 36+ mos IM  [name], it was a pleasure seeing you today!

## 2019-05-08 ENCOUNTER — Ambulatory Visit (INDEPENDENT_AMBULATORY_CARE_PROVIDER_SITE_OTHER): Payer: Medicare HMO | Admitting: Psychiatry

## 2019-05-08 ENCOUNTER — Other Ambulatory Visit: Payer: Self-pay

## 2019-05-08 ENCOUNTER — Encounter (HOSPITAL_COMMUNITY): Payer: Self-pay | Admitting: Psychiatry

## 2019-05-08 DIAGNOSIS — F901 Attention-deficit hyperactivity disorder, predominantly hyperactive type: Secondary | ICD-10-CM

## 2019-05-08 DIAGNOSIS — F331 Major depressive disorder, recurrent, moderate: Secondary | ICD-10-CM | POA: Diagnosis not present

## 2019-05-08 MED ORDER — DULOXETINE HCL 60 MG PO CPEP: ORAL_CAPSULE | ORAL | 2 refills | Status: DC

## 2019-05-08 MED ORDER — AMPHETAMINE-DEXTROAMPHETAMINE 20 MG PO TABS: 20.0000 mg | ORAL_TABLET | Freq: Two times a day (BID) | ORAL | 0 refills | Status: DC

## 2019-05-08 NOTE — Progress Notes (Signed)
Virtual Visit via Telephone Note  I connected with [name] on 05/08/19 at  9:40 AM EST by telephone and verified that I am speaking with the correct person using two identifiers.   I discussed the limitations, risks, security and privacy concerns of performing an evaluation and management service by telephone and the availability of in person appointments. I also discussed with the patient that there may be a patient responsible charge related to this service. The patient expressed understanding and agreed to proceed    I discussed the assessment and treatment plan with the patient. The patient was provided an opportunity to ask questions and all were answered. The patient agreed with the plan and demonstrated an understanding of the instructions.   The patient was advised to call back or seek an in-person evaluation if the symptoms worsen or if the condition fails to improve as anticipated.  I provided 15 minutes of non-face-to-face time during this encounter.   [name], MD  [facility] MD/PA/NP OP Progress Note  05/08/2019 10:31 AM [name]  MRN:  [id number]  Chief Complaint:  Chief Complaint    Depression; Anxiety; Follow-up     HPI: This patient is a 54 year old married female who lives with her husband in [place].  Currently her daughter and his daughter and their families are both living with them as well.  She is on disability.  The patient returns for follow-up after about 4 months for history of depression anxiety and ADHD.  Unfortunately she was hospitalized in the behavioral health hospital on 03/04/2019 for several days after she took an overdose of Xanax.  She states this was in the context of having so many people in the house and also some arguments about finances.  She states that she impulsively took 40 of the Xanax tablets because she wanted to go to sleep and not wake up.  Her husband found her like this and had her brought to the  hospital.  She no longer has any Xanax.  She was supposed to follow-up with me on 03/19/2019 but "forgot."  The patient states she is currently taking Cymbalta 60 mg daily as well as the Adderall 20 mg twice daily that she had been on previously.  She denies any thoughts of self-harm at this point in but states that she really does not want to leave her husband or anyone in her family.  She states that she tends to over worry about everyone else in the family except herself even though these are grown people need to make their own decisions.  She also desperately misses her sister who died last year.  I suggested therapy and she is in agreement with this.  She feels very remorseful about the suicide attempt and promises she will do anything like this again.  She is sleeping well for the most part and thinks that her level of function right now is good.  She denies significant anxiety or depressive symptoms. Visit Diagnosis:    ICD-10-CM   1. Moderate episode of recurrent major depressive disorder (HCC)  F33.1   2. Attention deficit hyperactivity disorder (ADHD), predominantly hyperactive type  F90.1     Past Psychiatric History: She was hospitalized twice in her 82s again in 2006 for drug overdose and again in November 2020 for Xanax overdose with suicidal intent.  Past Medical History:  Past Medical History:  Diagnosis Date  . Anemia   . Anxiety   . Asthmatic bronchitis    normal  PFT/ seen by pulmonary no evidence of COPD  . Atypical chest pain 10/05/2017  . Chest pain on respiration 03/25/2014  . Chronic bronchitis ([place])   . Chronic lower back pain   . Chronic respiratory failure ([place]) 10/16/2011   Newly 02 dep 24/7 p discharge from [facility] 01/2013  - 03/13/2014  Walked RA  2 laps @ 185 ft each stopped due to  Sob/ aching in legs, thirsty/ no desat @ slow pace   . Chronic respiratory failure with hypoxia (HCC)    On 2-3 L of oxygen at home  . Cigarette smoker 12/02/2010   Followed in Pulmonary  clinic/ [facility]/ [place]   - Limits of effective care reviewed 12/22/2011    . COPD (chronic obstructive pulmonary disease) ([place])   . COPD with chronic bronchitis ([place]) 10/01/2017  . Daily headache   . Depression   . Diabetic peripheral neuropathy ([place])   . DM (diabetes mellitus) type II controlled, neurological manifestation ([place]) 12/02/2010  . Gastric erosions    EGD 08/2010.  [name] GERD (gastroesophageal reflux disease)   . H/O drug abuse ([place]) 11/12/2017   -- scanned document from outside source: [facility] in [place] which showed UDS positive for Adderall /amphetamine usage as well as positive urine for THC.  This test result was collected 08/11/2016 and reported 10/07/2016. - lso review of the chart shows another positive amphetamine, THC and METH in the urine back on 10/18/2015 under "care everywhere". ---So   . Heavy menses   . High cholesterol   . History of blood transfusion    "related to low HgB" (10/05/2017)  . History of hiatal hernia   . HTN (hypertension)   . Hyperlipidemia associated with type 2 diabetes mellitus ([place]) 10/18/2015  . Hypertension associated with diabetes ([place]) 12/02/2010   D/c acei 12/22/2011 due to psuedowheeze and narcotic dependent cough> ? Improved - [id number] started bystolic in place of cozar due to cough    . Increased urinary protein excretion   . Internal hemorrhoids    Colonoscopy 5/12.  . Iron deficiency anemia 10/01/2017  . Mixed diabetic hyperlipidemia associated with type 2 diabetes mellitus ([place]) 10/01/2017  . On home oxygen therapy    "5L at night" (10/05/2017)  . OSA on CPAP   . Osteoarthritis    "back" (10/05/2017)  . Oxygen dependent 10/16/2011  . Pneumonia    "lots of times" (10/05/2017)  . Poorly controlled diabetes mellitus ([place]) 11/12/2017  . Pulmonary infiltrates 12/22/2011   Followed in Pulmonary clinic/ [facility]/ [place]    - See CT Chest 05/05/11   . Shortness of breath   . Tachycardia     never had test done since no insurance  . Tobacco use disorder-current smoker greater than 40-pack-year history- since age 65 1 ppd 10/01/2017  . Type II diabetes mellitus ([place])     Past Surgical History:  Procedure Laterality Date  . CESAREAN SECTION  1988; 1989  . CHOLECYSTECTOMY OPEN  1990  . COLONOSCOPY  09/16/2010   [id number] colon/small internal hemorrhoids  . ESOPHAGOGASTRODUODENOSCOPY  09/16/2010   SLF: normal/mild gastritis  . ESOPHAGOGASTRODUODENOSCOPY N/A 10/19/2014   Procedure: ESOPHAGOGASTRODUODENOSCOPY (EGD);  Surgeon: Danie Binder, MD;  Location: [facility] ENDO SUITE;  Service: Endoscopy;  Laterality: N/A;  830  . FRACTURE SURGERY    . HYSTEROSCOPY WITH THERMACHOICE  01/17/2012   Procedure: HYSTEROSCOPY WITH THERMACHOICE;  Surgeon: Florian Buff, MD;  Location: [facility] ORS;  Service: Gynecology;  Laterality:  N/A;  total therapy time: 9:13sec  D5W  18 ml in, D5W   45ml out, temperture 87degrees celcious  . KIDNEY SURGERY     as child for blockages  . TONSILLECTOMY    . TUBAL LIGATION  1989  . TYMPANOSTOMY TUBE PLACEMENT Bilateral    "several times when I was a child"  . uterine ablation    . WRIST FRACTURE SURGERY Left 1995    Family Psychiatric History: see below  Family History:  Family History  Problem Relation Age of Onset  . Heart attack Father 79       deceased, etoh use  . Heart disease Father   . Alcohol abuse Father   . Depression Father   . Heart failure Father   . Heart attack Mother 35       deceased  . Diabetes Mother   . Breast cancer Mother   . Heart failure Mother        oxygen dependence, nonsmoker  . Heart disease Mother   . Depression Mother   . Cancer Mother   . Liver disease Maternal Aunt 37       died while on liver transplant list  . Heart attack Maternal Grandmother        premature CAD  . Ulcers Sister   . Hypertension Sister   . Heart failure Sister   . Colon cancer Neg Hx     Social History:  Social History   Socioeconomic  History  . Marital status: Married    Spouse name: Not on file  . Number of children: 2  . Years of education: Not on file  . Highest education level: Not on file  Occupational History  . Occupation: unemployed  Tobacco Use  . Smoking status: Current Every Day Smoker    Packs/day: 1.00    Years: 39.00    Pack years: 39.00    Types: Cigarettes  . Smokeless tobacco: Never Used  Substance and Sexual Activity  . Alcohol use: Yes    Comment: 10/05/2017 "5 - 8 shots tequilia//month"  . Drug use: Yes    Types: Marijuana    Comment: 10/05/2017 "a few times/wk"  . Sexual activity: Yes    Partners: Male    Birth control/protection: Surgical  Other Topics Concern  . Not on file  Social History Narrative  . Not on file   Social Determinants of Health   Financial Resource Strain:   . Difficulty of Paying Living Expenses: Not on file  Food Insecurity:   . Worried About Charity fundraiser in the Last Year: Not on file  . Ran Out of Food in the Last Year: Not on file  Transportation Needs:   . Lack of Transportation (Medical): Not on file  . Lack of Transportation (Non-Medical): Not on file  Physical Activity:   . Days of Exercise per Week: Not on file  . Minutes of Exercise per Session: Not on file  Stress:   . Feeling of Stress : Not on file  Social Connections:   . Frequency of Communication with Friends and Family: Not on file  . Frequency of Social Gatherings with Friends and Family: Not on file  . Attends Religious Services: Not on file  . Active Member of Clubs or Organizations: Not on file  . Attends Archivist Meetings: Not on file  . Marital Status: Not on file    Allergies:  Allergies  Allergen Reactions  . Codeine Itching and Nausea Only  .  Metformin And Related Diarrhea and Other (See Comments)    Stomach pain/nausea  . Wellbutrin [Bupropion Hcl] Hives  . Acyclovir And Related Rash    Metabolic Disorder Labs: Lab Results  Component Value Date    HGBA1C 8.2 (H) 03/05/2019   MPG 188.64 03/05/2019   MPG 160 (H) 03/10/2015   No results found for: PROLACTIN Lab Results  Component Value Date   CHOL 131 02/12/2018   TRIG 89 03/06/2019   HDL 38 (L) 02/12/2018   CHOLHDL 3.4 02/12/2018   VLDL 35 10/06/2017   LDLCALC 74 02/12/2018   LDLCALC 132 (H) 10/06/2017   Lab Results  Component Value Date   TSH 2.40 11/26/2018   TSH 1.630 09/24/2018    Therapeutic Level Labs: No results found for: LITHIUM No results found for: VALPROATE No components found for:  CBMZ  Current Medications: Current Outpatient Medications  Medication Sig Dispense Refill  . albuterol (PROVENTIL) (2.5 MG/3ML) 0.083% nebulizer solution Take 3 mLs (2.5 mg total) by nebulization every 6 (six) hours as needed for wheezing or shortness of breath. 75 mL 12  . amphetamine-dextroamphetamine (ADDERALL) 20 MG tablet Take 1 tablet (20 mg total) by mouth 2 (two) times daily. 60 tablet 0  . aspirin EC 81 MG tablet Take 1 tablet (81 mg total) by mouth daily. 90 tablet 3  . atorvastatin (LIPITOR) 80 MG tablet Take 1 tablet (80 mg total) by mouth daily. 90 tablet 3  . BD PEN NEEDLE NANO U/F 32G X 4 MM MISC USE AS DIRECTED WITH VICTOZA 100 each 1  . canagliflozin (INVOKANA) 300 MG TABS tablet Take 1 tablet (300 mg total) by mouth daily before breakfast. 90 tablet 1  . DULoxetine (CYMBALTA) 60 MG capsule TAKE 1 CAPSULE(60 MG) BY MOUTH AT BEDTIME 30 capsule 2  . famotidine (PEPCID) 20 MG tablet Take 1 tablet (20 mg total) by mouth 2 (two) times daily. 30 tablet 0  . fluticasone (FLONASE) 50 MCG/ACT nasal spray USE 1 SPRAY IN EACH NOSTRIL TWICE DAILY AFTER SINUS RINSES (Patient taking differently: Place 1 spray into both nostrils daily. USE 1 SPRAY IN EACH NOSTRIL TWICE DAILY AFTER SINUS RINSES) 16 g 2  . Fluticasone-Salmeterol (ADVAIR DISKUS) 250-50 MCG/DOSE AEPB Inhale 1 puff into the lungs 2 (two) times daily. Flush mouth after each use- NO RF's NEEDS OV 1 each 0  . isosorbide  mononitrate (IMDUR) 30 MG 24 hr tablet TAKE 1 TABLET BY MOUTH EVERY DAY (Patient taking differently: Take 30 mg by mouth daily. ) 90 tablet 3  . levofloxacin (LEVAQUIN) 500 MG tablet Take 1 tablet (500 mg total) by mouth daily. 5 tablet 0  . liraglutide (VICTOZA) 18 MG/3ML SOPN INJECT 0.3 MLS (1.8 MG TOTAL) INTO THE SKIN DAILY. (Patient taking differently: Inject 1.8 mg into the skin daily. INJECT 0.3 MLS (1.8 MG TOTAL) INTO THE SKIN DAILY.) 27 mL 1  . losartan (COZAAR) 25 MG tablet Take 1 tablet (25 mg total) by mouth daily. 90 tablet 3  . nicotine (NICODERM CQ - DOSED IN MG/24 HOURS) 21 mg/24hr patch Place 1 patch (21 mg total) onto the skin daily. 28 patch 0  . pantoprazole (PROTONIX) 40 MG tablet Take 1 tablet (40 mg total) by mouth daily. 30 minutes before a meal. 90 tablet 3  . pregabalin (LYRICA) 150 MG capsule Take 1 capsule (150 mg total) by mouth 2 (two) times daily. No ReFills 180 capsule 1   Current Facility-Administered Medications  Medication Dose Route Frequency Provider Last Rate Last  Admin  . ipratropium-albuterol (DUONEB) 0.5-2.5 (3) MG/3ML nebulizer solution 3 mL  3 mL Nebulization Q6H Opalski, Jameel Quant, DO   3 mL at 01/10/18 1042     Musculoskeletal: Strength & Muscle Tone: within normal limits Gait & Station: normal Patient leans: N/A  Psychiatric Specialty Exam: Review of Systems  All other systems reviewed and are negative.   There were no vitals taken for this visit.There is no height or weight on file to calculate BMI.  General Appearance: NA  Eye Contact:  NA  Speech:  Clear and Coherent  Volume:  Normal  Mood:  Anxious  Affect:  NA  Thought Process:  Goal Directed  Orientation:  Full (Time, Place, and Person)  Thought Content: Rumination   Suicidal Thoughts:  No  Homicidal Thoughts:  No  Memory:  Immediate;   Good Recent;   Good Remote;   Good  Judgement:  Fair  Insight:  Fair  Psychomotor Activity:  Normal  Concentration:  Concentration: Good and  Attention Span: Good  Recall:  Good  Fund of Knowledge: Good  Language: Good  Akathisia:  No  Handed:  Right  AIMS (if indicated): not done  Assets:  Communication Skills Desire for Improvement Physical Health Resilience Social Support Talents/Skills  ADL's:  Intact  Cognition: WNL  Sleep:  Good   Screenings: AIMS     Admission (Discharged) from 03/07/2019 in [place] Admission (Discharged) from 09/12/2014 in [place] [facility]  AIMS Total Score  0  0    AUDIT     Admission (Discharged) from 03/07/2019 in [place] Admission (Discharged) from 09/12/2014 in [place] [facility]  Alcohol Use Disorder Identification Test Final Score (AUDIT)  0  0    PHQ2-9     Office Visit from 09/24/2018 in [place] at [facility] Visit from 07/09/2018 in [place] at [facility] Visit from 03/11/2018 in [place] at [facility] Visit from 01/10/2018 in [place] at [facility] Visit from 12/07/2017 in [place] at [facility] Total Score  2  3  0  2  2  PHQ-9 Total Score  9  12  3  7  10        Assessment and Plan: This patient is a 54 year old female with a history of depression anxiety and ADD.  Unfortunately she had a suicide attempt 2 months ago and has not been tremendously good about following up here since then.  I have expressed that need to see her on a more frequent basis.  She will return to see me in 4 weeks and we will set her up with therapy.  In the interim she will continue Cymbalta 60 mg daily for depression and anxiety and Adderall 20 mg twice daily for ADD.  She will return to see me in 4 weeks   [name], MD 05/08/2019, 10:31 AM

## 2019-05-09 ENCOUNTER — Telehealth: Payer: Self-pay | Admitting: Pulmonary Disease

## 2019-05-13 NOTE — Telephone Encounter (Signed)
Encounter created in error. Will sign off.  

## 2019-05-22 ENCOUNTER — Encounter: Payer: Self-pay | Admitting: Pulmonary Disease

## 2019-05-22 ENCOUNTER — Ambulatory Visit: Payer: Medicare HMO | Admitting: Pulmonary Disease

## 2019-05-22 ENCOUNTER — Other Ambulatory Visit: Payer: Self-pay

## 2019-05-22 VITALS — BP 130/70 | HR 90 | Temp 97.1°F | Ht 64.0 in | Wt 269.4 lb

## 2019-05-22 DIAGNOSIS — Z9989 Dependence on other enabling machines and devices: Secondary | ICD-10-CM | POA: Diagnosis not present

## 2019-05-22 DIAGNOSIS — J449 Chronic obstructive pulmonary disease, unspecified: Secondary | ICD-10-CM | POA: Diagnosis not present

## 2019-05-22 DIAGNOSIS — G4733 Obstructive sleep apnea (adult) (pediatric): Secondary | ICD-10-CM

## 2019-05-22 NOTE — Assessment & Plan Note (Signed)
We will redirect prescription for CPAP 10 cm to [facility]  with full facemask If necessary, we will obtain home sleep study to reassess OSA but clearly she has severe OSA and requires treatment  Once she stabilized on CPAP, she will need nocturnal oximetry on CPAP to see if she needs oxygen in addition.  Ideally needs a formal titration study but with Covid situation , will try to manage without  Weight loss encouraged, compliance with goal of at least 4-6 hrs every night is the expectation. Advised against medications with sedative side effects Cautioned against driving when sleepy - understanding that sleepiness will vary on a day to day basis

## 2019-05-22 NOTE — Progress Notes (Signed)
   Subjective:    Patient ID: [name], female    DOB: 05-17-1965, 54 y.o.   MRN: [id number]  HPI  54 year old smoker, diabetic for follow-up of OSA Initially diagnosed in 2015, but her CPAP machine broke, and got another replacement from a friend, unclear what this has been adjusted to. Last seen 11/2017 and question of whether she needed oxygen, repeat titration recommended but did not follow through, now presents to reestablish.  She was hospitalized 02/2019 with drug overdose of Xanax, she attributes this to depression due to family issues and impulsive behavior, denies suicidal ideation today.  She has been using her friend CPAP machine, with a replacement mask that she obtained during her hospitalization, wonders if insurance will now pay for a new machine. She continues to smoke a pack per day  Significant tests/ events reviewed  PFT's 03/18/2012 >spirometry nml 11/2017 CT cors >>lingular & RML scarring  NPSG 09/2013 >> AHI 73/hour 10/2013 CPAP titration 10 cm - 282 pounds  Review of Systems neg for any significant sore throat, dysphagia, itching, sneezing, nasal congestion or excess/ purulent secretions, fever, chills, sweats, unintended wt loss, pleuritic or exertional cp, hempoptysis, orthopnea pnd or change in chronic leg swelling. Also denies presyncope, palpitations, heartburn, abdominal pain, nausea, vomiting, diarrhea or change in bowel or urinary habits, dysuria,hematuria, rash, arthralgias, visual complaints, headache, numbness weakness or ataxia.     Objective:   Physical Exam   Gen. Pleasant, obese, in no distress ENT - no lesions, no post nasal drip Neck: No JVD, no thyromegaly, no carotid bruits Lungs: no use of accessory muscles, no dullness to percussion, decreased without rales or rhonchi  Cardiovascular: Rhythm regular, heart sounds  normal, no murmurs or gallops, no peripheral edema Musculoskeletal: No deformities, no cyanosis or clubbing  , no tremors        Assessment & Plan:

## 2019-05-22 NOTE — Patient Instructions (Signed)
Schedule home sleep test Prescription for CPAP 10 cm to [facility] with full facemask

## 2019-05-22 NOTE — Assessment & Plan Note (Signed)
Continues to smoke, smoking cessation was again emphasized is the most important intervention here. Last chest x-ray from 02/2019 reviewed which shows lingular scarring/atelectasis which has been seen on previous CT scan from 2019.  Do not feel that she needs bronchodilators at present. We will reassess with spirometry in the future

## 2019-05-30 ENCOUNTER — Other Ambulatory Visit: Payer: Self-pay | Admitting: Family Medicine

## 2019-05-30 DIAGNOSIS — E1129 Type 2 diabetes mellitus with other diabetic kidney complication: Secondary | ICD-10-CM

## 2019-05-30 NOTE — Telephone Encounter (Signed)
Last OV 02/21/19 Last fill 12/11/18  #90/1

## 2019-06-05 ENCOUNTER — Encounter (HOSPITAL_COMMUNITY): Payer: Self-pay | Admitting: Psychiatry

## 2019-06-05 ENCOUNTER — Other Ambulatory Visit: Payer: Self-pay

## 2019-06-05 ENCOUNTER — Ambulatory Visit (INDEPENDENT_AMBULATORY_CARE_PROVIDER_SITE_OTHER): Payer: Medicare HMO | Admitting: Psychiatry

## 2019-06-05 DIAGNOSIS — F331 Major depressive disorder, recurrent, moderate: Secondary | ICD-10-CM

## 2019-06-05 DIAGNOSIS — F901 Attention-deficit hyperactivity disorder, predominantly hyperactive type: Secondary | ICD-10-CM | POA: Diagnosis not present

## 2019-06-05 MED ORDER — AMPHETAMINE-DEXTROAMPHETAMINE 30 MG PO TABS: 30.0000 mg | ORAL_TABLET | Freq: Two times a day (BID) | ORAL | 0 refills | Status: DC

## 2019-06-05 MED ORDER — DULOXETINE HCL 60 MG PO CPEP: ORAL_CAPSULE | ORAL | 2 refills | Status: DC

## 2019-06-05 NOTE — Progress Notes (Signed)
Virtual Visit via Telephone Note  I connected with [place] on 06/05/19 at  1:00 PM EST by telephone and verified that I am speaking with the correct person using two identifiers.   I discussed the limitations, risks, security and privacy concerns of performing an evaluation and management service by telephone and the availability of in person appointments. I also discussed with the patient that there may be a patient responsible charge related to this service. The patient expressed understanding and agreed to proceed.     I discussed the assessment and treatment plan with the patient. The patient was provided an opportunity to ask questions and all were answered. The patient agreed with the plan and demonstrated an understanding of the instructions.   The patient was advised to call back or seek an in-person evaluation if the symptoms worsen or if the condition fails to improve as anticipated.  I provided 15 minutes of non-face-to-face time during this encounter.   [name], MD  [facility] MD/PA/NP OP Progress Note  06/05/2019 1:22 PM [name]  MRN:  [id number]  Chief Complaint:  Chief Complaint    Depression; Anxiety; Follow-up     HPI: This patient is a 54 year old married female who lives with her husband in [place].  Currently her daughter and his daughter and their families are both living with them as well.  She is on disability.  The patient returns after 4 weeks for follow-up regarding depression and anxiety.  Unfortunately she had been hospitalized in November after taking an overdose of Xanax.  She is no longer taking this medication.  Her mood has been much more stable and she is getting along better with everyone in the family.  She did find out that her daughter is pregnant and due in June.  They are trying to make do with the little amount of space they have because her landlord recently reduced the rent to make it financially easier.   She recently saw pulmonary to get restarted on CPAP and is awaiting the machine.  She states that her mood is stable and she denies serious anxiety thoughts of self-harm or suicidal ideation.  She is focusing well with the Adderall although she states I sent in the wrong dosage and she is supposed to be on 30 mg twice daily.  She also again requests to be set up with a therapist Visit Diagnosis:    ICD-10-CM   1. Attention deficit hyperactivity disorder (ADHD), predominantly hyperactive type  F90.1   2. Moderate episode of recurrent major depressive disorder ([place])  F33.1     Past Psychiatric History: She was hospitalized twice in her 43s and again in 2006 for drug overdose and again in November 2020 for Xanax overdose with suicidal intent  Past Medical History:  Past Medical History:  Diagnosis Date  . Anemia   . Anxiety   . Asthmatic bronchitis    normal PFT/ seen by pulmonary no evidence of COPD  . Atypical chest pain 10/05/2017  . Chest pain on respiration 03/25/2014  . Chronic bronchitis ([place])   . Chronic lower back pain   . Chronic respiratory failure ([place]) 10/16/2011   Newly 02 dep 24/7 p discharge from [facility] 01/2013  - 03/13/2014  Walked RA  2 laps @ 185 ft each stopped due to  Sob/ aching in legs, thirsty/ no desat @ slow pace   . Chronic respiratory failure with hypoxia (HCC)    On 2-3 L of oxygen at home  .  Cigarette smoker 12/02/2010   Followed in Pulmonary clinic/ [facility]/ [place]   - Limits of effective care reviewed 12/22/2011    . COPD (chronic obstructive pulmonary disease) ([place])   . COPD with chronic bronchitis ([place]) 10/01/2017  . Daily headache   . Depression   . Diabetic peripheral neuropathy ([place])   . DM (diabetes mellitus) type II controlled, neurological manifestation ([place]) 12/02/2010  . Gastric erosions    EGD 08/2010.  [name] GERD (gastroesophageal reflux disease)   . H/O drug abuse ([place]) 11/12/2017   -- scanned document from outside source: [facility] in [place] which showed UDS positive for Adderall /amphetamine usage as well as positive urine for THC.  This test result was collected 08/11/2016 and reported 10/07/2016. - lso review of the chart shows another positive amphetamine, THC and METH in the urine back on 10/18/2015 under "care everywhere". ---So   . Heavy menses   . High cholesterol   . History of blood transfusion    "related to low HgB" (10/05/2017)  . History of hiatal hernia   . HTN (hypertension)   . Hyperlipidemia associated with type 2 diabetes mellitus ([place]) 10/18/2015  . Hypertension associated with diabetes ([place]) 12/02/2010   D/c acei 12/22/2011 due to psuedowheeze and narcotic dependent cough> ? Improved - [id number] started bystolic in place of cozar due to cough    . Increased urinary protein excretion   . Internal hemorrhoids    Colonoscopy 5/12.  . Iron deficiency anemia 10/01/2017  . Mixed diabetic hyperlipidemia associated with type 2 diabetes mellitus ([place]) 10/01/2017  . On home oxygen therapy    "5L at night" (10/05/2017)  . OSA on CPAP   . Osteoarthritis    "back" (10/05/2017)  . Oxygen dependent 10/16/2011  . Pneumonia    "lots of times" (10/05/2017)  . Poorly controlled diabetes mellitus ([place]) 11/12/2017  . Pulmonary infiltrates 12/22/2011   Followed in Pulmonary clinic/ [facility]/ [place]    - See CT Chest 05/05/11   . Shortness of breath   . Tachycardia    never had test done since no insurance  . Tobacco use disorder-current smoker greater than 40-pack-year history- since age 41 1 ppd 10/01/2017  . Type II diabetes mellitus ([place])     Past Surgical History:  Procedure Laterality Date  . CESAREAN SECTION  1988; 1989  . CHOLECYSTECTOMY OPEN  1990  . COLONOSCOPY  09/16/2010   [id number] colon/small internal hemorrhoids  . ESOPHAGOGASTRODUODENOSCOPY  09/16/2010   SLF: normal/mild gastritis  . ESOPHAGOGASTRODUODENOSCOPY N/A 10/19/2014   Procedure: ESOPHAGOGASTRODUODENOSCOPY  (EGD);  Surgeon: Danie Binder, MD;  Location: [facility] END[facility] SUITE;  Service: Endoscopy;  Laterality: N/A;  830  . FRACTURE SURGERY    . HYSTEROSCOPY WITH THERMACHOICE  01/17/2012   Procedure: HYSTEROSCOPY WITH THERMACHOICE;  Surgeon: Florian Buff, MD;  Location: [facility] ORS;  Service: Gynecology;  Laterality: N/A;  total therapy time: 9:13sec  D5W  18 ml in, D5W   25ml out, temperture 87degrees celcious  . KIDNEY SURGERY     as child for blockages  . TONSILLECTOMY    . TUBAL LIGATION  1989  . TYMPANOSTOMY TUBE PLACEMENT Bilateral    "several times when I was a child"  . uterine ablation    . WRIST FRACTURE SURGERY Left 1995    Family Psychiatric History: see below  Family History:  Family History  Problem Relation Age of Onset  . Heart attack Father 60  deceased, etoh use  . Heart disease Father   . Alcohol abuse Father   . Depression Father   . Heart failure Father   . Heart attack Mother 23       deceased  . Diabetes Mother   . Breast cancer Mother   . Heart failure Mother        oxygen dependence, nonsmoker  . Heart disease Mother   . Depression Mother   . Cancer Mother   . Liver disease Maternal Aunt 24       died while on liver transplant list  . Heart attack Maternal Grandmother        premature CAD  . Ulcers Sister   . Hypertension Sister   . Heart failure Sister   . Colon cancer Neg Hx     Social History:  Social History   Socioeconomic History  . Marital status: Married    Spouse name: Not on file  . Number of children: 2  . Years of education: Not on file  . Highest education level: Not on file  Occupational History  . Occupation: unemployed  Tobacco Use  . Smoking status: Current Every Day Smoker    Packs/day: 1.00    Years: 39.00    Pack years: 39.00    Types: Cigarettes  . Smokeless tobacco: Never Used  Substance and Sexual Activity  . Alcohol use: Yes    Comment: 10/05/2017 "5 - 8 shots tequilia//month"  . Drug use: Yes    Types: Marijuana     Comment: 10/05/2017 "a few times/wk"  . Sexual activity: Yes    Partners: Male    Birth control/protection: Surgical  Other Topics Concern  . Not on file  Social History Narrative  . Not on file   Social Determinants of Health   Financial Resource Strain:   . Difficulty of Paying Living Expenses: Not on file  Food Insecurity:   . Worried About Charity fundraiser in the Last Year: Not on file  . Ran Out of Food in the Last Year: Not on file  Transportation Needs:   . Lack of Transportation (Medical): Not on file  . Lack of Transportation (Non-Medical): Not on file  Physical Activity:   . Days of Exercise per Week: Not on file  . Minutes of Exercise per Session: Not on file  Stress:   . Feeling of Stress : Not on file  Social Connections:   . Frequency of Communication with Friends and Family: Not on file  . Frequency of Social Gatherings with Friends and Family: Not on file  . Attends Religious Services: Not on file  . Active Member of Clubs or Organizations: Not on file  . Attends Archivist Meetings: Not on file  . Marital Status: Not on file    Allergies:  Allergies  Allergen Reactions  . Atorvastatin Nausea Only    When on 80 mg dosage  . Codeine Itching and Nausea Only  . Metformin And Related Diarrhea and Other (See Comments)    Stomach pain/nausea  . Wellbutrin [Bupropion Hcl] Hives  . Acyclovir And Related Rash    Metabolic Disorder Labs: Lab Results  Component Value Date   HGBA1C 8.2 (H) 03/05/2019   MPG 188.64 03/05/2019   MPG 160 (H) 03/10/2015   No results found for: PROLACTIN Lab Results  Component Value Date   CHOL 131 02/12/2018   TRIG 89 03/06/2019   HDL 38 (L) 02/12/2018   CHOLHDL 3.4 02/12/2018  VLDL 35 10/06/2017   LDLCALC 74 02/12/2018   LDLCALC 132 (H) 10/06/2017   Lab Results  Component Value Date   TSH 2.40 11/26/2018   TSH 1.630 09/24/2018    Therapeutic Level Labs: No results found for: LITHIUM No results found  for: VALPROATE No components found for:  CBMZ  Current Medications: Current Outpatient Medications  Medication Sig Dispense Refill  . albuterol (PROVENTIL) (2.5 MG/3ML) 0.083% nebulizer solution Take 3 mLs (2.5 mg total) by nebulization every 6 (six) hours as needed for wheezing or shortness of breath. 75 mL 12  . amphetamine-dextroamphetamine (ADDERALL) 20 MG tablet Take 1 tablet (20 mg total) by mouth 2 (two) times daily. 60 tablet 0  . amphetamine-dextroamphetamine (ADDERALL) 30 MG tablet Take 1 tablet by mouth 2 (two) times daily. 60 tablet 0  . amphetamine-dextroamphetamine (ADDERALL) 30 MG tablet Take 1 tablet by mouth 2 (two) times daily. 60 tablet 0  . amphetamine-dextroamphetamine (ADDERALL) 30 MG tablet Take 1 tablet by mouth 2 (two) times daily. 60 tablet 0  . aspirin EC 81 MG tablet Take 1 tablet (81 mg total) by mouth daily. 90 tablet 3  . BD PEN NEEDLE NANO U/F 32G X 4 MM MISC USE AS DIRECTED WITH VICTOZA 100 each 1  . DULoxetine (CYMBALTA) 60 MG capsule TAKE 1 CAPSULE(60 MG) BY MOUTH AT BEDTIME 30 capsule 2  . famotidine (PEPCID) 20 MG tablet Take 1 tablet (20 mg total) by mouth 2 (two) times daily. 30 tablet 0  . fluticasone (FLONASE) 50 MCG/ACT nasal spray USE 1 SPRAY IN EACH NOSTRIL TWICE DAILY AFTER SINUS RINSES (Patient taking differently: Place 1 spray into both nostrils daily. USE 1 SPRAY IN EACH NOSTRIL TWICE DAILY AFTER SINUS RINSES) 16 g 2  . Fluticasone-Salmeterol (ADVAIR DISKUS) 250-50 MCG/DOSE AEPB Inhale 1 puff into the lungs 2 (two) times daily. Flush mouth after each use- NO RF's NEEDS OV 1 each 0  . INVOKANA 300 MG TABS tablet TAKE 1 TABLET(300 MG) BY MOUTH DAILY BEFORE BREAKFAST 90 tablet 1  . isosorbide mononitrate (IMDUR) 30 MG 24 hr tablet TAKE 1 TABLET BY MOUTH EVERY DAY (Patient taking differently: Take 30 mg by mouth daily. ) 90 tablet 3  . levofloxacin (LEVAQUIN) 500 MG tablet Take 1 tablet (500 mg total) by mouth daily. 5 tablet 0  . liraglutide (VICTOZA)  18 MG/3ML SOPN INJECT 0.3 MLS (1.8 MG TOTAL) INTO THE SKIN DAILY. (Patient taking differently: Inject 1.8 mg into the skin daily. INJECT 0.3 MLS (1.8 MG TOTAL) INTO THE SKIN DAILY.) 27 mL 1  . losartan (COZAAR) 25 MG tablet Take 1 tablet (25 mg total) by mouth daily. 90 tablet 3  . nicotine (NICODERM CQ - DOSED IN MG/24 HOURS) 21 mg/24hr patch Place 1 patch (21 mg total) onto the skin daily. 28 patch 0  . pantoprazole (PROTONIX) 40 MG tablet Take 1 tablet (40 mg total) by mouth daily. 30 minutes before a meal. 90 tablet 3  . pregabalin (LYRICA) 150 MG capsule Take 1 capsule (150 mg total) by mouth 2 (two) times daily. No ReFills 180 capsule 1   Current Facility-Administered Medications  Medication Dose Route Frequency Provider Last Rate Last Admin  . ipratropium-albuterol (DUONEB) 0.5-2.5 (3) MG/3ML nebulizer solution 3 mL  3 mL Nebulization Q6H Opalski, Monetta Lick, DO   3 mL at 01/10/18 1042     Musculoskeletal: Strength & Muscle Tone: within normal limits Gait & Station: normal Patient leans: N/A  Psychiatric Specialty Exam: Review of Systems  Respiratory:  Positive for shortness of breath.   All other systems reviewed and are negative.   There were no vitals taken for this visit.There is no height or weight on file to calculate BMI.  General Appearance: NA  Eye Contact:  NA  Speech:  Clear and Coherent  Volume:  Normal  Mood:  Euthymic  Affect:  NA  Thought Process:  Goal Directed  Orientation:  Full (Time, Place, and Person)  Thought Content: WDL   Suicidal Thoughts:  No  Homicidal Thoughts:  No  Memory:  Immediate;   Good Recent;   Good Remote;   Fair  Judgement:  Fair  Insight:  Fair  Psychomotor Activity:  Normal  Concentration:  Concentration: Good and Attention Span: Good  Recall:  Good  Fund of Knowledge: Good  Language: Good  Akathisia:  No  Handed:  Right  AIMS (if indicated): not done  Assets:  Communication Skills Desire for Improvement Resilience Social  Support Talents/Skills  ADL's:  Intact  Cognition: WNL  Sleep:  Good   Screenings: AIMS     Admission (Discharged) from 03/07/2019 in [place] Admission (Discharged) from 09/12/2014 in [facility]  AIMS Total Score  0  0    AUDIT     Admission (Discharged) from 03/07/2019 in [facility] Admission (Discharged) from 09/12/2014 in [facility]  Alcohol Use Disorder Identification Test Final Score (AUDIT)  0  0    PHQ2-9     Office Visit from 09/24/2018 in [place] at [facility] Visit from 07/09/2018 in [place] at [facility] Visit from 03/11/2018 in [place] at [facility] Visit from 01/10/2018 in [place] at [facility] Visit from 12/07/2017 in [place] at [facility] Total Score  2  3  0  2  2  PHQ-9 Total Score  9  12  3  7  10        Assessment and Plan: This patient is a 54 year old female with a history of depression and anxiety as well as ADD.  She has been doing much better recently.  She will continue Cymbalta 60 mg daily for depression and Adderall 30 mg twice daily for ADD.  We will set her up for therapy here and she will return to see me in 3 months.   [name], MD 06/05/2019, 1:22 PM

## 2019-06-12 ENCOUNTER — Encounter: Payer: Self-pay | Admitting: Family Medicine

## 2019-06-16 ENCOUNTER — Other Ambulatory Visit: Payer: Self-pay

## 2019-06-16 MED ORDER — BD PEN NEEDLE NANO U/F 32G X 4 MM MISC: 11 refills | Status: DC

## 2019-06-19 ENCOUNTER — Other Ambulatory Visit: Payer: Self-pay

## 2019-06-20 ENCOUNTER — Other Ambulatory Visit: Payer: Self-pay | Admitting: Family Medicine

## 2019-06-20 ENCOUNTER — Encounter: Payer: Self-pay | Admitting: Family Medicine

## 2019-06-20 ENCOUNTER — Ambulatory Visit (INDEPENDENT_AMBULATORY_CARE_PROVIDER_SITE_OTHER): Payer: Medicare HMO | Admitting: Family Medicine

## 2019-06-20 VITALS — BP 130/80 | HR 85 | Temp 97.0°F | Ht 64.0 in | Wt 266.0 lb

## 2019-06-20 DIAGNOSIS — H409 Unspecified glaucoma: Secondary | ICD-10-CM | POA: Diagnosis not present

## 2019-06-20 DIAGNOSIS — E1169 Type 2 diabetes mellitus with other specified complication: Secondary | ICD-10-CM | POA: Diagnosis not present

## 2019-06-20 DIAGNOSIS — H7291 Unspecified perforation of tympanic membrane, right ear: Secondary | ICD-10-CM | POA: Diagnosis not present

## 2019-06-20 DIAGNOSIS — H9201 Otalgia, right ear: Secondary | ICD-10-CM | POA: Diagnosis not present

## 2019-06-20 DIAGNOSIS — G8929 Other chronic pain: Secondary | ICD-10-CM

## 2019-06-20 MED ORDER — BLOOD GLUCOSE METER KIT: PACK | 0 refills | Status: AC

## 2019-06-20 MED ORDER — ATORVASTATIN CALCIUM 40 MG PO TABS: 40.0000 mg | ORAL_TABLET | Freq: Every day | ORAL | 2 refills | Status: DC

## 2019-06-20 MED ORDER — ACCU-CHEK SOFTCLIX LANCET DEV KIT: PACK | 0 refills | Status: DC

## 2019-06-20 NOTE — Patient Instructions (Addendum)
[facility] [address], [place], [place] [address] Phone: [phone]  [facility] [address], ,  [address] Phone: [phone]  

## 2019-06-20 NOTE — Addendum Note (Signed)
Addended by: Darral Dash on: 06/20/2019 03:56 PM   Modules accepted: Orders

## 2019-06-20 NOTE — Progress Notes (Signed)
[name] is a 54 y.o. female  Chief Complaint  Patient presents with  . Follow-up    pt is needing referrarl for ear,wants to discuss medication dosgages, pt starting weight loss clinic March 8th wants to hold off for CPE just want to touch base    HPI: [name] is a 54 y.o. female here with a few issues/concerns: 1. Requests referral to ENT for chronic Rt ear pain, drainage, hole in TM x 7-8 years 2. Referral to ophthalmology to f/u on dx of glaucoma. She states she had her annual DM eye exam at [facility] and was told no retinopathy but glaucoma B/L. They were supposed to refer her to a specialist but pt states she was never contacted.  3. She is Rx'd lipitor '80mg'$  daily but feels this has been causing her to have GI symptoms of nausea and pain. She stopped med and feels symptoms have improved. She would like to try taking '40mg'$  daily to see if less/no side effects. 4. She has initial appt with [facility] on 3/8 and is very excited about this. 5. Pt requests Rx for new glucometer and test strips. She has been purchasing testing supplies OTC and they are costly.  Past Medical History:  Diagnosis Date  . Anemia   . Anxiety   . Asthmatic bronchitis    normal PFT/ seen by pulmonary no evidence of COPD  . Atypical chest pain 10/05/2017  . Chest pain on respiration 03/25/2014  . Chronic bronchitis ([place])   . Chronic lower back pain   . Chronic respiratory failure ([place]) 10/16/2011   Newly 02 dep 24/7 p discharge from [facility] 01/2013  - 03/13/2014  Walked RA  2 laps @ 185 ft each stopped due to  Sob/ aching in legs, thirsty/ no desat @ slow pace   . Chronic respiratory failure with hypoxia (HCC)    On 2-3 L of oxygen at home  . Cigarette smoker 12/02/2010   Followed in Pulmonary clinic/ [facility]/ [place]   - Limits of effective care reviewed 12/22/2011    . COPD (chronic obstructive pulmonary disease) ([place])   . COPD with chronic bronchitis  ([place]) 10/01/2017  . Daily headache   . Depression   . Diabetic peripheral neuropathy ([place])   . DM (diabetes mellitus) type II controlled, neurological manifestation ([place]) 12/02/2010  . Gastric erosions    EGD 08/2010.  [name] GERD (gastroesophageal reflux disease)   . H/O drug abuse ([place]) 11/12/2017   -- scanned document from outside source: [facility] in [place] which showed UDS positive for Adderall /amphetamine usage as well as positive urine for THC.  This test result was collected 08/11/2016 and reported 10/07/2016. - lso review of the chart shows another positive amphetamine, THC and METH in the urine back on 10/18/2015 under "care everywhere". ---So   . Heavy menses   . High cholesterol   . History of blood transfusion    "related to low HgB" (10/05/2017)  . History of hiatal hernia   . HTN (hypertension)   . Hyperlipidemia associated with type 2 diabetes mellitus ([place]) 10/18/2015  . Hypertension associated with diabetes ([place]) 12/02/2010   D/c acei 12/22/2011 due to psuedowheeze and narcotic dependent cough> ? Improved - 49/44/9675 started bystolic in place of cozar due to cough    . Increased urinary protein excretion   . Internal hemorrhoids    Colonoscopy 5/12.  . Iron deficiency anemia  10/01/2017  . Mixed diabetic hyperlipidemia associated with type 2 diabetes mellitus ([place]) 10/01/2017  . On home oxygen therapy    "5L at night" (10/05/2017)  . OSA on CPAP   . Osteoarthritis    "back" (10/05/2017)  . Oxygen dependent 10/16/2011  . Pneumonia    "lots of times" (10/05/2017)  . Poorly controlled diabetes mellitus ([place]) 11/12/2017  . Pulmonary infiltrates 12/22/2011   [facility] clinic[facility] [place]    - See CT Chest 05/05/11   . Shortness of breath   . Tachycardia    never had test done since no insurance  . Tobacco use disorder-current smoker greater than 40-pack-year history- since age 21 1 ppd 10/01/2017  . Type II diabetes  mellitus ([place])     Past Surgical History:  Procedure Laterality Date  . CESAREAN SECTION  1988; 1989  . CHOLECYSTECTOMY OPEN  1990  . COLONOSCOPY  09/16/2010   TOI:ZTIWPY colon/small internal hemorrhoids  . ESOPHAGOGASTRODUODENOSCOPY  09/16/2010   SLF: normal/mild gastritis  . ESOPHAGOGASTRODUODENOSCOPY N/A 10/19/2014   Procedure: ESOPHAGOGASTRODUODENOSCOPY (EGD);  Surgeon: Danie Binder, MD;  Location: [facility];  Service: Endoscopy;  Laterality: N/A;  830  . FRACTURE SURGERY    . HYSTEROSCOPY WITH THERMACHOICE  01/17/2012   Procedure: HYSTEROSCOPY WITH THERMACHOICE;  Surgeon: Florian Buff, MD;  Location: [facility] ORS;  Service: Gynecology;  Laterality: N/A;  total therapy time: 9:13sec  D5W  18 ml in, D5W   41m out, temperture 87degrees celcious  . KIDNEY SURGERY     as child for blockages  . TONSILLECTOMY    . TUBAL LIGATION  1989  . TYMPANOSTOMY TUBE PLACEMENT Bilateral    "several times when I was a child"  . uterine ablation    . WRIST FRACTURE SURGERY Left 1995    Social History   Socioeconomic History  . Marital status: Married    Spouse name: Not on file  . Number of children: 2  . Years of education: Not on file  . Highest education level: Not on file  Occupational History  . Occupation: unemployed  Tobacco Use  . Smoking status: Current Every Day Smoker    Packs/day: 1.00    Years: 39.00    Pack years: 39.00    Types: Cigarettes  . Smokeless tobacco: Never Used  Substance and Sexual Activity  . Alcohol use: Yes    Comment: 10/05/2017 "5 - 8 shots tequilia//month"  . Drug use: Yes    Types: Marijuana    Comment: 10/05/2017 "a few times/wk"  . Sexual activity: Yes    Partners: Male    Birth control/protection: Surgical  Other Topics Concern  . Not on file  Social History Narrative  . Not on file   Social Determinants of Health   Financial Resource Strain:   . Difficulty of Paying Living Expenses: Not on file  Food Insecurity:   . Worried About RPaediatric nursein the Last Year: Not on file  . Ran Out of Food in the Last Year: Not on file  Transportation Needs:   . Lack of Transportation (Medical): Not on file  . Lack of Transportation (Non-Medical): Not on file  Physical Activity:   . Days of Exercise per Week: Not on file  . Minutes of Exercise per Session: Not on file  Stress:   . Feeling of Stress : Not on file  Social Connections:   . Frequency of Communication with Friends and Family: Not on file  .  Frequency of Social Gatherings with Friends and Family: Not on file  . Attends Religious Services: Not on file  . Active Member of Clubs or Organizations: Not on file  . Attends Archivist Meetings: Not on file  . Marital Status: Not on file  Intimate Partner Violence:   . Fear of Current or Ex-Partner: Not on file  . Emotionally Abused: Not on file  . Physically Abused: Not on file  . Sexually Abused: Not on file    Family History  Problem Relation Age of Onset  . Heart attack Father 58       deceased, etoh use  . Heart disease Father   . Alcohol abuse Father   . Depression Father   . Heart failure Father   . Heart attack Mother 57       deceased  . Diabetes Mother   . Breast cancer Mother   . Heart failure Mother        oxygen dependence, nonsmoker  . Heart disease Mother   . Depression Mother   . Cancer Mother   . Liver disease Maternal Aunt 48       died while on liver transplant list  . Heart attack Maternal Grandmother        premature CAD  . Ulcers Sister   . Hypertension Sister   . Heart failure Sister   . Colon cancer Neg Hx      Immunization History  Administered Date(s) Administered  . Influenza Split 01/11/2012, 04/08/2016  . Influenza,inj,Quad PF,6+ Mos 12/31/2012, 01/16/2014, 03/10/2015, 03/11/2018, 03/13/2019  . Influenza-Unspecified 01/29/2017  . Pneumococcal Polysaccharide-23 02/20/2012  . Tdap 03/11/2018    Outpatient Encounter Medications as of 06/20/2019  Medication Sig    . albuterol (PROVENTIL) (2.5 MG/3ML) 0.083% nebulizer solution Take 3 mLs (2.5 mg total) by nebulization every 6 (six) hours as needed for wheezing or shortness of breath.  . amphetamine-dextroamphetamine (ADDERALL) 20 MG tablet Take 1 tablet (20 mg total) by mouth 2 (two) times daily.  [name] amphetamine-dextroamphetamine (ADDERALL) 30 MG tablet Take 1 tablet by mouth 2 (two) times daily.  [name] aspirin EC 81 MG tablet Take 1 tablet (81 mg total) by mouth daily.  . DULoxetine (CYMBALTA) 60 MG capsule TAKE 1 CAPSULE(60 MG) BY MOUTH AT BEDTIME  . fluticasone (FLONASE) 50 MCG/ACT nasal spray USE 1 SPRAY IN EACH NOSTRIL TWICE DAILY AFTER SINUS RINSES (Patient taking differently: Place 1 spray into both nostrils daily. USE 1 SPRAY IN EACH NOSTRIL TWICE DAILY AFTER SINUS RINSES)  . Insulin Pen Needle (BD PEN NEEDLE NANO U/F) 32G X 4 MM MISC USE AS DIRECTED WITH VICTOZA  . INVOKANA 300 MG TABS tablet TAKE 1 TABLET(300 MG) BY MOUTH DAILY BEFORE BREAKFAST  . isosorbide mononitrate (IMDUR) 30 MG 24 hr tablet TAKE 1 TABLET BY MOUTH EVERY DAY (Patient taking differently: Take 30 mg by mouth daily. )  . levofloxacin (LEVAQUIN) 500 MG tablet Take 1 tablet (500 mg total) by mouth daily.  [name] liraglutide (VICTOZA) 18 MG/3ML SOPN INJECT 0.3 MLS (1.8 MG TOTAL) INTO THE SKIN DAILY. (Patient taking differently: Inject 1.8 mg into the skin daily. INJECT 0.3 MLS (1.8 MG TOTAL) INTO THE SKIN DAILY.)  . losartan (COZAAR) 25 MG tablet Take 1 tablet (25 mg total) by mouth daily.  . pantoprazole (PROTONIX) 40 MG tablet Take 1 tablet (40 mg total) by mouth daily. 30 minutes before a meal.  . pregabalin (LYRICA) 150 MG capsule Take 1 capsule (150 mg total) by mouth  2 (two) times daily. No ReFills  . [DISCONTINUED] amphetamine-dextroamphetamine (ADDERALL) 30 MG tablet Take 1 tablet by mouth 2 (two) times daily.  . [DISCONTINUED] amphetamine-dextroamphetamine (ADDERALL) 30 MG tablet Take 1 tablet by mouth 2 (two) times daily.  .  famotidine (PEPCID) 20 MG tablet Take 1 tablet (20 mg total) by mouth 2 (two) times daily. (Patient not taking: Reported on 06/20/2019)  . Fluticasone-Salmeterol (ADVAIR DISKUS) 250-50 MCG/DOSE AEPB Inhale 1 puff into the lungs 2 (two) times daily. Flush mouth after each use- NO RF's NEEDS OV (Patient not taking: Reported on 06/20/2019)  . nicotine (NICODERM CQ - DOSED IN MG/24 HOURS) 21 mg/24hr patch Place 1 patch (21 mg total) onto the skin daily. (Patient not taking: Reported on 06/20/2019)   Facility-Administered Encounter Medications as of 06/20/2019  Medication  . ipratropium-albuterol (DUONEB) 0.5-2.5 (3) MG/3ML nebulizer solution 3 mL     ROS: Pertinent positives and negatives noted in HPI. Remainder of ROS non-contributory     Allergies  Allergen Reactions  . Atorvastatin Nausea Only    When on 80 mg dosage  . Codeine Itching and Nausea Only  . Metformin And Related Diarrhea and Other (See Comments)    Stomach pain/nausea  . Wellbutrin [Bupropion Hcl] Hives  . Acyclovir And Related Rash    BP 130/80 (BP Location: Right Arm, Patient Position: Sitting, Cuff Size: Large)   Pulse 85   Temp (!) 97 F (36.1 C) (Tympanic)   Ht '5\' 4"'$  (1.626 m)   Wt 266 lb (120.7 kg)   SpO2 95%   BMI 45.66 kg/m   Physical Exam  Constitutional: She is oriented to person, place, and time. She appears well-developed and well-nourished. No distress.  Pulmonary/Chest: No respiratory distress.  Neurological: She is alert and oriented to person, place, and time. Coordination normal.  Psychiatric: She has a normal mood and affect. Her behavior is normal.     A/P:  1. Chronic right ear pain 2. Hole in the ear drum, right - Ambulatory referral to ENT  3. Type 2 diabetes mellitus with other specified complication, without long-term current use of insulin (HCC) Rx: - atorvastatin (LIPITOR) 40 MG tablet; Take 1 tablet (40 mg total) by mouth daily.  Dispense: 30 tablet; Refill: 2 - dose decreased  from '80mg'$  daily to see if pt experiences less side effects compared to '80mg'$  daily dose Rx: - blood glucose meter kit and supplies; Dispense based on patient and insurance preference. Use up to four times daily as directed. (FOR ICD-10 E10.9, E11.9).  Dispense: 1 each; Refill: 0 - Ambulatory referral to Ophthalmology - RTO for DM f/u in 1-2 mo  4. Glaucoma of both eyes, unspecified glaucoma type - Ambulatory referral to Ophthalmology   This visit occurred during the SARS-CoV-2 public health emergency.  Safety protocols were in place, including screening questions prior to the visit, additional usage of staff PPE, and extensive cleaning of exam room while observing appropriate contact time as indicated for disinfecting solutions.

## 2019-06-25 ENCOUNTER — Ambulatory Visit: Payer: Medicare HMO

## 2019-06-25 ENCOUNTER — Other Ambulatory Visit: Payer: Self-pay

## 2019-06-25 DIAGNOSIS — G4733 Obstructive sleep apnea (adult) (pediatric): Secondary | ICD-10-CM

## 2019-06-30 ENCOUNTER — Telehealth: Payer: Self-pay | Admitting: Pulmonary Disease

## 2019-06-30 ENCOUNTER — Other Ambulatory Visit: Payer: Self-pay

## 2019-06-30 ENCOUNTER — Ambulatory Visit (INDEPENDENT_AMBULATORY_CARE_PROVIDER_SITE_OTHER): Payer: Medicare HMO | Admitting: Family Medicine

## 2019-06-30 ENCOUNTER — Encounter (INDEPENDENT_AMBULATORY_CARE_PROVIDER_SITE_OTHER): Payer: Self-pay | Admitting: Family Medicine

## 2019-06-30 VITALS — BP 127/78 | HR 91 | Temp 98.0°F | Ht 64.0 in | Wt 264.0 lb

## 2019-06-30 DIAGNOSIS — E1165 Type 2 diabetes mellitus with hyperglycemia: Secondary | ICD-10-CM | POA: Diagnosis not present

## 2019-06-30 DIAGNOSIS — R0602 Shortness of breath: Secondary | ICD-10-CM | POA: Diagnosis not present

## 2019-06-30 DIAGNOSIS — R5383 Other fatigue: Secondary | ICD-10-CM | POA: Diagnosis not present

## 2019-06-30 DIAGNOSIS — Z720 Tobacco use: Secondary | ICD-10-CM

## 2019-06-30 DIAGNOSIS — Z6841 Body Mass Index (BMI) 40.0 and over, adult: Secondary | ICD-10-CM | POA: Diagnosis not present

## 2019-06-30 DIAGNOSIS — F3289 Other specified depressive episodes: Secondary | ICD-10-CM | POA: Diagnosis not present

## 2019-06-30 DIAGNOSIS — G4733 Obstructive sleep apnea (adult) (pediatric): Secondary | ICD-10-CM

## 2019-06-30 DIAGNOSIS — Z0289 Encounter for other administrative examinations: Secondary | ICD-10-CM

## 2019-06-30 DIAGNOSIS — E559 Vitamin D deficiency, unspecified: Secondary | ICD-10-CM

## 2019-06-30 NOTE — Progress Notes (Signed)
Dear Dr. Havery Moros,   Thank you for referring Kamiryn JARIYAH KASSEL to our clinic. The following note includes my evaluation and treatment recommendations.  Chief Complaint:   OBESITY [name] (MR# [id number]) is a 54 y.o. female who presents for evaluation and treatment of obesity and related comorbidities. Maebell was referred to our clinic by Carlota Raspberry. Armbruster, MD. Current BMI is Body mass index is 45.32 kg/m.[name] Levette has been struggling with her weight for many years and has been unsuccessful in either losing weight, maintaining weight loss, or reaching her healthy weight goal.  Rudi is currently in the action stage of change and ready to dedicate time achieving and maintaining a healthier weight. Laporscha is interested in becoming our patient and working on intensive lifestyle modifications including (but not limited to) diet and exercise for weight loss.  Lisbet's habits were reviewed today and are as follows: Her family eats meals together, her desired weight loss is 99 pounds, she has been heavy most of her life, she started gaining weight in her 30's, her heaviest weight ever was 295 pounds, she has significant food cravings issues, she snacks frequently in the evenings, she wakes up frequently in the middle of the night to eat, she skips meals frequently, she is frequently drinking liquids with calories, she frequently makes poor food choices, she has problems with excessive hunger, she frequently eats larger portions than normal, she has binge eating behaviors and she struggles with emotional eating.  Shamyah has coffee with sugar free creamer, and between 10:00 and 11:00 AM, she has cereal (Raisin Bran), 2 to 3 cups and 2% skim milk (feels full). Patient eats fruit or salad with ham (2 slices), cheese with crackers (6 to 7), cookies or chips (4-5). Avanelle has pot roast for dinner.  Depression Screen Jalaiyah's Food and Mood (modified PHQ-9)  score was strongly positive. PHQ 9 Score is 17.  Depression screen PHQ 2/9 06/30/2019  Decreased Interest 3  Down, Depressed, Hopeless 2  PHQ - 2 Score 5  Altered sleeping 3  Tired, decreased energy 3  Change in appetite 3  Feeling bad or failure about yourself  1  Trouble concentrating 1  Moving slowly or fidgety/restless 0  Suicidal thoughts 1  PHQ-9 Score 17  Difficult doing work/chores Somewhat difficult  Some recent data might be hidden   Subjective:   Other fatigue  Cortney admits to daytime somnolence and admits to waking up still tired. Patent has a history of symptoms of daytime fatigue, morning fatigue and morning headache. Merrisa generally gets 6 to 8 hours of sleep per night, and states that she has restless sleep. Snoring is present. Apneic episodes are present. Epworth Sleepiness Score is 8.  Shortness of breath on exertion  Myanna notes increasing shortness of breath with exercising and seems to be worsening over time with weight gain. She notes getting out of breath sooner with activity than she used to. This has not gotten worse recently. Disa denies shortness of breath at rest or orthopnea.  OSA (obstructive sleep apnea) Mallery has a diagnosis of sleep apnea. She reports that she sleeps with CPAP. Meelah sees Dr. Elsworth Soho.   Type 2 diabetes mellitus with hyperglycemia, without long-term current use of insulin (HCC) -  Jalyn is on Victoza 1.8 mg, and Invokana. Her last foot exam was tow to three years ago. She is on ARB statin. Kensly has been diagnosed since 2010 at least. Her fasting BGs range between 165 and 170.  Her last A1c was approximately 8.0 (patient can't recall when).  Lab Results  Component Value Date   HGBA1C 8.2 (H) 03/05/2019   HGBA1C 7.7 (H) 09/24/2018   HGBA1C 7.8 (H) 02/12/2018   Lab Results  Component Value Date   MICROALBUR 10 10/01/2017   LDLCALC 74 02/12/2018   CREATININE 0.59 03/07/2019   No results found for: INSULIN  Vitamin D  deficiency  Dashay's Vitamin D level was 46.7 on 09/24/18. She is not on vit D supplementation. She admits fatigue.   Tobacco abuse Emilynn is smoking 1 1/2 packs per day. She has plans to discuss a plan to quit with her husband.  Other depression, with emotional eating Candida sees Margurite Auerbach (Psychiatrist). She is on Cymbalta. She has an appointment with the therapist in April.  Assessment/Plan:   Other fatigue Shakima does feel that her weight is causing her energy to be lower than it should be. Fatigue may be related to obesity, depression or many other causes. Labs and EKG will be ordered, and in the meanwhile, Rozina will focus on self care including making healthy food choices, increasing physical activity and focusing on stress reduction.=  Shortness of breath on exertion Juno does feel that she gets out of breath more easily that she used to when she exercises. Kalynne's shortness of breath appears to be obesity related and exercise induced. She has agreed to work on weight loss and gradually increase exercise to treat her exercise induced shortness of breath. Labs and indirect calorimetry will be ordered. We will continue to monitor closely.  OSA (obstructive sleep apnea) Intensive lifestyle modifications are the first line treatment for this issue. We discussed several lifestyle modifications today and she will continue to work on diet, exercise and weight loss efforts. We will continue to monitor.Sareen will follow up with Dr. Elsworth Soho. Orders and follow up as documented in patient record.   Type 2 diabetes mellitus with hyperglycemia, without long-term current use of insulin (HCC) Good blood sugar control is important to decrease the likelihood of diabetic complications such as nephropathy, neuropathy, limb loss, blindness, coronary artery disease, and death. Intensive lifestyle modification including diet, exercise and weight loss are the first line of treatment for diabetes. We  will check Hgb A1c and insulin today.  Vitamin D deficiency  Low Vitamin D level contributes to fatigue and are associated with obesity, breast, and colon cancer. We will check vitamin D level today.  Tobacco abuse We will follow up at the next appointment.  Other depression, with emotional eating Behavior modification techniques were discussed today to help Jizel deal with her emotional/non-hunger eating behaviors. Demecia will follow up with therapist appointment. Orders and follow up as documented in patient record.   Class 3 severe obesity with serious comorbidity and body mass index (BMI) of 45.0 to 49.9 in adult, unspecified obesity type ([place]) Maymuna is currently in the action stage of change and her goal is to continue with weight loss efforts. I recommend Kayte begin the structured treatment plan as follows:  She has agreed to the Category 3 Plan.  Exercise goals: No exercise has been prescribed at this time.   Behavioral modification strategies: increasing lean protein intake, increasing vegetables, meal planning and cooking strategies, keeping healthy foods in the home and planning for success.  She was informed of the importance of frequent follow-up visits to maximize her success with intensive lifestyle modifications for her multiple health conditions. She was informed we would discuss her lab results at her  next visit unless there is a critical issue that needs to be addressed sooner. Trilby agreed to keep her next visit at the agreed upon time to discuss these results.  Objective:   Blood pressure 127/78, pulse 91, temperature 98 F (36.7 C), temperature source Oral, height 5\' 4"  (1.626 m), weight 264 lb (119.7 kg), SpO2 95 %. Body mass index is 45.32 kg/m.  EKG: Normal sinus rhythm, rate 93 BPM.  Indirect Calorimeter completed today shows a VO2 of 338 and a REE of 2351.  Her calculated basal metabolic rate is [id number] thus her basal metabolic rate is better than  expected.  General: Cooperative, alert, well developed, in no acute distress. HEENT: Conjunctivae and lids unremarkable. Cardiovascular: Regular rhythm.  Lungs: Normal work of breathing. Neurologic: No focal deficits.   Lab Results  Component Value Date   CREATININE 0.59 03/07/2019   BUN 12 03/07/2019   NA 136 03/07/2019   K 3.6 03/07/2019   CL 102 03/07/2019   CO2 24 03/07/2019   Lab Results  Component Value Date   ALT 23 03/05/2019   AST 20 03/05/2019   ALKPHOS 61 03/05/2019   BILITOT 0.6 03/05/2019   Lab Results  Component Value Date   HGBA1C 8.2 (H) 03/05/2019   HGBA1C 7.7 (H) 09/24/2018   HGBA1C 7.8 (H) 02/12/2018   HGBA1C 8.0 (A) 01/10/2018   HGBA1C 10.2 (A) 10/01/2017   No results found for: INSULIN Lab Results  Component Value Date   TSH 2.40 11/26/2018   Lab Results  Component Value Date   CHOL 131 02/12/2018   HDL 38 (L) 02/12/2018   LDLCALC 74 02/12/2018   LDLDIRECT 121 (H) 10/03/2013   TRIG 89 03/06/2019   CHOLHDL 3.4 02/12/2018   Lab Results  Component Value Date   WBC 12.1 (H) 03/07/2019   HGB 13.3 03/07/2019   HCT 44.6 03/07/2019   MCV 84.6 03/07/2019   PLT 216 03/07/2019   Lab Results  Component Value Date   IRON 45 11/21/2018   TIBC 337 11/21/2018   FERRITIN 91 11/21/2018    Ref. Range 09/24/2018 12:00  Vitamin D, 25-Hydroxy Latest Ref Range: 30.0 - 100.0 ng/mL 46.7    Obesity Behavioral Intervention Visit Documentation for Insurance:   Approximately 15 minutes were spent on the discussion below.  ASK: We discussed the diagnosis of obesity with Sharmila today and Chancey agreed to give [place] permission to discuss obesity behavioral modification therapy today.  ASSESS: Alyssamae has the diagnosis of obesity and her BMI today is 45.29. Marvis is in the action stage of change.   ADVISE: Corrissa was educated on the multiple health risks of obesity as well as the benefit of weight loss to improve her health. She was advised of the need  for long term treatment and the importance of lifestyle modifications to improve her current health and to decrease her risk of future health problems.  AGREE: Multiple dietary modification options and treatment options were discussed and Keaisha agreed to follow the recommendations documented in the above note.  ARRANGE: Wenona was educated on the importance of frequent visits to treat obesity as outlined per CMS and USPSTF guidelines and agreed to schedule her next follow up appointment today.  Attestation Statements:   This is the patient's first visit at [facility]. The patient's NEW PATIENT PACKET was reviewed at length. Included in the packet: current and past health history, medications, allergies, ROS, gynecologic history (women only), surgical history, family history, social history, weight history,  weight loss surgery history (for those that have had weight loss surgery), nutritional evaluation, mood and food questionnaire, PHQ9, Epworth questionnaire, sleep habits questionnaire, patient life and health improvement goals questionnaire. These will all be scanned into the patient's chart under media.   During the visit, I independently reviewed the patient's EKG, bioimpedance scale results, and indirect calorimeter results. I used this information to tailor a meal plan for the patient that will help her to lose weight and will improve her obesity-related conditions going forward. I performed a medically necessary appropriate examination and/or evaluation. I discussed the assessment and treatment plan with the patient. The patient was provided an opportunity to ask questions and all were answered. The patient agreed with the plan and demonstrated an understanding of the instructions. Labs were ordered at this visit and will be reviewed at the next visit unless more critical results need to be addressed immediately. Clinical information was updated and documented in the EMR.    Time spent on visit including pre-visit chart review and post-visit care was 45 minutes.   A separate 15 minutes was spent on risk counseling (see above).    I, Doreene Nest, am acting as transcriptionist for Coralie Common, MD.  I have reviewed the above documentation for accuracy and completeness, and I agree with the above. - Ilene Qua, MD

## 2019-06-30 NOTE — Telephone Encounter (Signed)
HST showed moderate OSA 23/hour. Please ensure the prescription to [facility] has been sent for CPAP 10 cm with a full facemask.  Office visit with APP/me in 4 to 6 weeks

## 2019-07-01 LAB — COMPREHENSIVE METABOLIC PANEL
ALT: 15 IU/L (ref 0–32)
AST: 14 IU/L (ref 0–40)
Albumin/Globulin Ratio: 1.7 (ref 1.2–2.2)
Albumin: 4.5 g/dL (ref 3.8–4.9)
Alkaline Phosphatase: 85 IU/L (ref 39–117)
BUN/Creatinine Ratio: 10 (ref 9–23)
BUN: 8 mg/dL (ref 6–24)
Bilirubin Total: 0.4 mg/dL (ref 0.0–1.2)
CO2: 25 mmol/L (ref 20–29)
Calcium: 9.6 mg/dL (ref 8.7–10.2)
Chloride: 100 mmol/L (ref 96–106)
Creatinine, Ser: 0.84 mg/dL (ref 0.57–1.00)
GFR calc Af Amer: 92 mL/min/{1.73_m2} (ref >59)
GFR calc non Af Amer: 80 mL/min/{1.73_m2} (ref >59)
Globulin, Total: 2.6 g/dL (ref 1.5–4.5)
Glucose: 120 mg/dL — ABNORMAL HIGH (ref 65–99)
Potassium: 4.6 mmol/L (ref 3.5–5.2)
Sodium: 140 mmol/L (ref 134–144)
Total Protein: 7.1 g/dL (ref 6.0–8.5)

## 2019-07-01 LAB — HEMOGLOBIN A1C
Est. average glucose Bld gHb Est-mCnc: 171 mg/dL
Hgb A1c MFr Bld: 7.6 % — ABNORMAL HIGH (ref 4.8–5.6)

## 2019-07-01 LAB — LIPID PANEL WITH LDL/HDL RATIO
Cholesterol, Total: 151 mg/dL (ref 100–199)
HDL: 48 mg/dL (ref >39)
LDL Chol Calc (NIH): 85 mg/dL (ref 0–99)
LDL/HDL Ratio: 1.8 ratio (ref 0.0–3.2)
Triglycerides: 98 mg/dL (ref 0–149)
VLDL Cholesterol Cal: 18 mg/dL (ref 5–40)

## 2019-07-01 LAB — FOLATE: Folate: 7.4 ng/mL (ref >3.0)

## 2019-07-01 LAB — CBC WITH DIFFERENTIAL/PLATELET
Basophils Absolute: 0.1 10*3/uL (ref 0.0–0.2)
Basos: 1 %
EOS (ABSOLUTE): 0.1 10*3/uL (ref 0.0–0.4)
Eos: 1 %
Hematocrit: 44.8 % (ref 34.0–46.6)
Hemoglobin: 14.4 g/dL (ref 11.1–15.9)
Immature Grans (Abs): 0 10*3/uL (ref 0.0–0.1)
Immature Granulocytes: 0 %
Lymphocytes Absolute: 3.2 10*3/uL — ABNORMAL HIGH (ref 0.7–3.1)
Lymphs: 23 %
MCH: 25.7 pg — ABNORMAL LOW (ref 26.6–33.0)
MCHC: 32.1 g/dL (ref 31.5–35.7)
MCV: 80 fL (ref 79–97)
Monocytes Absolute: 0.9 10*3/uL (ref 0.1–0.9)
Monocytes: 6 %
Neutrophils Absolute: 9.8 10*3/uL — ABNORMAL HIGH (ref 1.4–7.0)
Neutrophils: 69 %
Platelets: 294 10*3/uL (ref 150–450)
RBC: 5.6 x10E6/uL — ABNORMAL HIGH (ref 3.77–5.28)
RDW: 15 % (ref 11.7–15.4)
WBC: 14.2 10*3/uL — ABNORMAL HIGH (ref 3.4–10.8)

## 2019-07-01 LAB — INSULIN, RANDOM: INSULIN: 13.8 u[IU]/mL (ref 2.6–24.9)

## 2019-07-01 LAB — T3: T3, Total: 108 ng/dL (ref 71–180)

## 2019-07-01 LAB — VITAMIN B12: Vitamin B-12: 486 pg/mL (ref 232–1245)

## 2019-07-01 LAB — T4, FREE: Free T4: 1.17 ng/dL (ref 0.82–1.77)

## 2019-07-01 LAB — TSH: TSH: 1.86 u[IU]/mL (ref 0.450–4.500)

## 2019-07-01 LAB — VITAMIN D 25 HYDROXY (VIT D DEFICIENCY, FRACTURES): Vit D, 25-Hydroxy: 24.9 ng/mL — ABNORMAL LOW (ref 30.0–100.0)

## 2019-07-02 NOTE — Telephone Encounter (Signed)
Patient already has follow up visit set with Rexene Edison, NP on 08/20/19. Order for CPAP with setting of 10 sent dated 05/22/19 received by [facility] 06/04/19.  Confirmed with [employer] the CPAP machine was already shipped to the patient with a setting of 10 and delivered on 06/16/19.  Nothing further needed at this time.

## 2019-07-04 LAB — HM DIABETES EYE EXAM

## 2019-07-07 ENCOUNTER — Other Ambulatory Visit: Payer: Self-pay

## 2019-07-07 ENCOUNTER — Ambulatory Visit (INDEPENDENT_AMBULATORY_CARE_PROVIDER_SITE_OTHER): Payer: Medicare HMO | Admitting: Psychiatry

## 2019-07-07 ENCOUNTER — Encounter (HOSPITAL_COMMUNITY): Payer: Self-pay | Admitting: Psychiatry

## 2019-07-07 DIAGNOSIS — F901 Attention-deficit hyperactivity disorder, predominantly hyperactive type: Secondary | ICD-10-CM | POA: Diagnosis not present

## 2019-07-07 DIAGNOSIS — F331 Major depressive disorder, recurrent, moderate: Secondary | ICD-10-CM
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
## 2019-07-08 ENCOUNTER — Ambulatory Visit (INDEPENDENT_AMBULATORY_CARE_PROVIDER_SITE_OTHER): Payer: Medicare HMO | Admitting: Otolaryngology

## 2019-07-08 ENCOUNTER — Other Ambulatory Visit: Payer: Self-pay

## 2019-07-08 ENCOUNTER — Encounter (INDEPENDENT_AMBULATORY_CARE_PROVIDER_SITE_OTHER): Payer: Self-pay | Admitting: Otolaryngology

## 2019-07-08 VITALS — Temp 97.7°F

## 2019-07-08 DIAGNOSIS — J310 Chronic rhinitis: Secondary | ICD-10-CM

## 2019-07-08 DIAGNOSIS — H6983 Other specified disorders of Eustachian tube, bilateral: Secondary | ICD-10-CM | POA: Diagnosis not present

## 2019-07-08 DIAGNOSIS — H7291 Unspecified perforation of tympanic membrane, right ear: Secondary | ICD-10-CM

## 2019-07-08 NOTE — Progress Notes (Signed)
HPI: [name] is a 54 y.o. female who presents is referred by her PCP for evaluation of chronic right TM perforation with intermittent drainage that she has had for years.  She apparently had tubes placed when she was a child a couple of times.  She has had intermittent drainage from the right ear for a number of years and has had a known hole in the right ear for over 10 years. She has history of obstructive sleep apnea and uses nasal CPAP. She also has history of nasal sinus issues and uses Flonase intermittently.  Past Medical History:  Diagnosis Date  . ADHD   . Alcohol abuse   . Anemia   . Anxiety   . Asthmatic bronchitis    normal PFT/ seen by pulmonary no evidence of COPD  . Atypical chest pain 10/05/2017  . Back pain   . Bilateral swelling of feet   . Chest pain   . Chest pain on respiration 03/25/2014  . Chronic bronchitis ([place])   . Chronic lower back pain   . Chronic respiratory failure ([place]) 10/16/2011   Newly 02 dep 24/7 p discharge from [facility] 01/2013  - 03/13/2014  Walked RA  2 laps @ 185 ft each stopped due to  Sob/ aching in legs, thirsty/ no desat @ slow pace   . Chronic respiratory failure with hypoxia (HCC)    On 2-3 L of oxygen at home  . Cigarette smoker 12/02/2010   Followed in Pulmonary clinic/ [facility]/ [place]   - Limits of effective care reviewed 12/22/2011    . Constipation   . COPD (chronic obstructive pulmonary disease) ([place])   . COPD with chronic bronchitis ([place]) 10/01/2017  . Daily headache   . Depression   . Diabetic peripheral neuropathy ([place])   . DM (diabetes mellitus) type II controlled, neurological manifestation ([place]) 12/02/2010  . Drug use   . Gallbladder problem   . Gastric erosions    EGD 08/2010.  [name] GERD (gastroesophageal reflux disease)   . Glaucoma   . H/O drug abuse ([place]) 11/12/2017   -- scanned document from outside source: [facility] in [place] which showed UDS positive for  Adderall /amphetamine usage as well as positive urine for THC.  This test result was collected 08/11/2016 and reported 10/07/2016. - lso review of the chart shows another positive amphetamine, THC and METH in the urine back on 10/18/2015 under "care everywhere". ---So   . Heavy menses   . High cholesterol   . History of blood transfusion    "related to low HgB" (10/05/2017)  . History of hiatal hernia   . HTN (hypertension)   . Hyperlipidemia associated with type 2 diabetes mellitus ([place]) 10/18/2015  . Hypertension associated with diabetes ([place]) 12/02/2010   D/c acei 12/22/2011 due to psuedowheeze and narcotic dependent cough> ? Improved - 62/86/3817 started bystolic in place of cozar due to cough    . IBS (irritable bowel syndrome)   . Increased urinary protein excretion   . Internal hemorrhoids    Colonoscopy 5/12.  . Iron deficiency anemia 10/01/2017  . Joint pain   . Mixed diabetic hyperlipidemia associated with type 2 diabetes mellitus ([place]) 10/01/2017  . On home oxygen therapy    "5L at night" (10/05/2017)  . OSA on CPAP   . Osteoarthritis    "back" (10/05/2017)  . Oxygen dependent 10/16/2011  . Pneumonia    "lots of times" (10/05/2017)  . Poorly controlled  diabetes mellitus ([place]) 11/12/2017  . Pulmonary infiltrates 12/22/2011   Followed in Pulmonary clinic/ [facility]/ [place]    - See CT Chest 05/05/11   . Shortness of breath   . Tachycardia    never had test done since no insurance  . Tobacco use disorder-current smoker greater than 40-pack-year history- since age 82 1 ppd 10/01/2017  . Type II diabetes mellitus ([place])   . Vitamin D deficiency    Past Surgical History:  Procedure Laterality Date  . CESAREAN SECTION  1988; 1989  . CHOLECYSTECTOMY OPEN  1990  . COLONOSCOPY  09/16/2010   ZOX:[id number] colon/small internal hemorrhoids  . ESOPHAGOGASTRODUODENOSCOPY  09/16/2010   SLF: normal/mild gastritis  . ESOPHAGOGASTRODUODENOSCOPY N/A 10/19/2014   Procedure:  ESOPHAGOGASTRODUODENOSCOPY (EGD);  Surgeon: Danie Binder, MD;  Location: [facility] ENDO SUITE;  Service: Endoscopy;  Laterality: N/A;  830  . FRACTURE SURGERY    . HYSTEROSCOPY WITH THERMACHOICE  01/17/2012   Procedure: HYSTEROSCOPY WITH THERMACHOICE;  Surgeon: Florian Buff, MD;  Location: [facility] ORS;  Service: Gynecology;  Laterality: N/A;  total therapy time: 9:13sec  D5W  18 ml in, D5W   15m out, temperture 87degrees celcious  . KIDNEY SURGERY     as child for blockages  . TONSILLECTOMY    . TUBAL LIGATION  1989  . TYMPANOSTOMY TUBE PLACEMENT Bilateral    "several times when I was a child"  . uterine ablation    . WRIST FRACTURE SURGERY Left 1995   Social History   Socioeconomic History  . Marital status: Married    Spouse name: Not on file  . Number of children: 2  . Years of education: Not on file  . Highest education level: Not on file  Occupational History  . Occupation: unemployed  Tobacco Use  . Smoking status: Current Every Day Smoker    Packs/day: 1.50    Years: 40.00    Pack years: 60.00    Types: Cigarettes    Start date: 89 . Smokeless tobacco: Never Used  Substance and Sexual Activity  . Alcohol use: Yes    Comment: once a month - 3-4 mixed drinks  . Drug use: Yes    Types: Marijuana    Comment: daily use  . Sexual activity: Yes    Partners: Male    Birth control/protection: Surgical  Other Topics Concern  . Not on file  Social History Narrative  . Not on file   Social Determinants of Health   Financial Resource Strain:   . Difficulty of Paying Living Expenses:   Food Insecurity:   . Worried About RCharity fundraiserin the Last Year:   . RArboriculturistin the Last Year:   Transportation Needs:   . LFilm/video editor(Medical):   .Marland KitchenLack of Transportation (Non-Medical):   Physical Activity:   . Days of Exercise per Week:   . Minutes of Exercise per Session:   Stress:   . Feeling of Stress :   Social Connections:   . Frequency of Communication  with Friends and Family:   . Frequency of Social Gatherings with Friends and Family:   . Attends Religious Services:   . Active Member of Clubs or Organizations:   . Attends CArchivistMeetings:   .Marland KitchenMarital Status:    Family History  Problem Relation Age of Onset  . Heart attack Father 417      deceased, etoh use  . Heart disease Father   .  Alcohol abuse Father   . Depression Father   . Heart failure Father   . Sudden death Father   . Heart attack Mother 71       deceased  . Diabetes Mother   . Breast cancer Mother   . Heart failure Mother        oxygen dependence, nonsmoker  . Heart disease Mother   . Depression Mother   . Cancer Mother   . Hypertension Mother   . Hyperlipidemia Mother   . Sudden death Mother   . Sleep apnea Mother   . Obesity Mother   . Liver disease Maternal Aunt 35       died while on liver transplant list  . Heart attack Maternal Grandmother        premature CAD  . Ulcers Sister   . Hypertension Sister   . Heart failure Sister   . Depression Sister   . Anxiety disorder Sister   . Colon cancer Neg Hx    Allergies  Allergen Reactions  . Codeine Itching and Nausea Only  . Metformin And Related Diarrhea and Other (See Comments)    Stomach pain/nausea  . Wellbutrin [Bupropion Hcl] Hives  . Acyclovir And Related Rash   Prior to Admission medications   Medication Sig Start Date End Date Taking? Authorizing Provider  Accu-Chek Softclix Lancets lancets USE UP TO FOUR TIMES DAILY AS DIRECTED 06/20/19  Yes Cirigliano, Mary K, DO  albuterol (PROVENTIL) (2.5 MG/3ML) 0.083% nebulizer solution Take 3 mLs (2.5 mg total) by nebulization every 6 (six) hours as needed for wheezing or shortness of breath. 02/21/19  Yes Malvin Johns, MD  amphetamine-dextroamphetamine (ADDERALL) 30 MG tablet Take 1 tablet by mouth 2 (two) times daily. 06/05/19 06/04/20 Yes Cloria Spring, MD  aspirin EC 81 MG tablet Take 1 tablet (81 mg total) by mouth daily. 12/07/17   Yes Dunn, Dayna N, PA-C  atorvastatin (LIPITOR) 40 MG tablet Take 1 tablet (40 mg total) by mouth daily. 06/20/19  Yes Cirigliano, Mary K, DO  blood glucose meter kit and supplies Dispense based on patient and insurance preference. Use up to four times daily as directed. (FOR ICD-10 E10.9, E11.9). 06/20/19  Yes Cirigliano, Mary K, DO  DULoxetine (CYMBALTA) 60 MG capsule TAKE 1 CAPSULE(60 MG) BY MOUTH AT BEDTIME 06/05/19  Yes Cloria Spring, MD  fluticasone (FLONASE) 50 MCG/ACT nasal spray USE 1 SPRAY IN EACH NOSTRIL TWICE DAILY AFTER SINUS RINSES Patient taking differently: Place 1 spray into both nostrils daily. USE 1 SPRAY IN EACH NOSTRIL TWICE DAILY AFTER SINUS RINSES 12/11/18  Yes Cirigliano, Mary K, DO  Insulin Pen Needle (BD PEN NEEDLE NANO U/F) 32G X 4 MM MISC USE AS DIRECTED WITH VICTOZA 06/16/19  Yes Cirigliano, Mary K, DO  INVOKANA 300 MG TABS tablet TAKE 1 TABLET(300 MG) BY MOUTH DAILY BEFORE BREAKFAST 05/30/19  Yes Cirigliano, Mary K, DO  isosorbide mononitrate (IMDUR) 30 MG 24 hr tablet TAKE 1 TABLET BY MOUTH EVERY DAY Patient taking differently: Take 30 mg by mouth daily.  02/07/19  Yes Dunn, Dayna N, PA-C  Lancets Misc. (ACCU-CHEK SOFTCLIX LANCET DEV) KIT Check Blood sugar up to 4 times a day 06/20/19  Yes Cirigliano, Mary K, DO  liraglutide (VICTOZA) 18 MG/3ML SOPN INJECT 0.3 MLS (1.8 MG TOTAL) INTO THE SKIN DAILY. Patient taking differently: Inject 1.8 mg into the skin daily. INJECT 0.3 MLS (1.8 MG TOTAL) INTO THE SKIN DAILY. 12/11/18  Yes Cirigliano, Mary K, DO  losartan (COZAAR)  25 MG tablet Take 1 tablet (25 mg total) by mouth daily. 12/11/18  Yes Cirigliano, Mary K, DO  pantoprazole (PROTONIX) 40 MG tablet Take 1 tablet (40 mg total) by mouth daily. 30 minutes before a meal. 11/21/18 11/16/19 Yes Cirigliano, Mary K, DO  pregabalin (LYRICA) 150 MG capsule Take 1 capsule (150 mg total) by mouth 2 (two) times daily. No ReFills 11/21/18  Yes Cirigliano, Mary K, DO     Positive ROS: Otherwise  negative  All other systems have been reviewed and were otherwise negative with the exception of those mentioned in the HPI and as above.  Physical Exam: Constitutional: Alert, well-appearing, no acute distress Ears: External ears without lesions or tenderness.  On microscopic exam she has a moderate sized central right TM perforation with clear mucus discharge from the perforation.  After cleaning the right ear canal and perforation I applied CSF powder to the right ear.  The left TM is slightly retracted but no middle ear effusion noted. Nasal: External nose without lesions. Septum with mild deformity..  Moderate rhinitis with clear mucus discharge.  Both middle meatus regions were clear with no evidence of active mucopurulent discharge. Oral: Lips and gums without lesions. Tongue and palate mucosa without lesions. Posterior oropharynx clear. Neck: No palpable adenopathy or masses Respiratory: Breathing comfortably  Skin: No facial/neck lesions or rash noted.  Procedures  Assessment: Chronic right TM perforation with chronic drainage.  Plan: Recommended regular use of nasal steroid spray and prescribed Nasacort 2 sprays each nostril at night. Also prescribed Otovel 5 drops twice daily in the right ear for the next 10 days.  Recommended keeping all water out of the right ear. She will follow-up in 3 weeks for recheck and audiologic testing.   Radene Journey, MD   CC:

## 2019-07-09 ENCOUNTER — Telehealth (INDEPENDENT_AMBULATORY_CARE_PROVIDER_SITE_OTHER): Payer: Self-pay

## 2019-07-14 ENCOUNTER — Ambulatory Visit (INDEPENDENT_AMBULATORY_CARE_PROVIDER_SITE_OTHER): Payer: Medicare HMO | Admitting: Family Medicine

## 2019-07-23 ENCOUNTER — Encounter (INDEPENDENT_AMBULATORY_CARE_PROVIDER_SITE_OTHER): Payer: Self-pay | Admitting: Bariatrics

## 2019-07-23 ENCOUNTER — Other Ambulatory Visit: Payer: Self-pay

## 2019-07-23 ENCOUNTER — Ambulatory Visit (INDEPENDENT_AMBULATORY_CARE_PROVIDER_SITE_OTHER): Payer: Medicare HMO | Admitting: Bariatrics

## 2019-07-23 ENCOUNTER — Other Ambulatory Visit (INDEPENDENT_AMBULATORY_CARE_PROVIDER_SITE_OTHER): Payer: Self-pay | Admitting: Bariatrics

## 2019-07-23 VITALS — BP 115/70 | HR 93 | Temp 97.9°F | Ht 64.0 in | Wt 263.0 lb

## 2019-07-23 DIAGNOSIS — E1165 Type 2 diabetes mellitus with hyperglycemia: Secondary | ICD-10-CM

## 2019-07-23 DIAGNOSIS — E559 Vitamin D deficiency, unspecified: Secondary | ICD-10-CM

## 2019-07-23 DIAGNOSIS — F3289 Other specified depressive episodes: Secondary | ICD-10-CM

## 2019-07-23 DIAGNOSIS — E66813 Obesity, class 3: Secondary | ICD-10-CM

## 2019-07-23 DIAGNOSIS — D72828 Other elevated white blood cell count: Secondary | ICD-10-CM

## 2019-07-23 DIAGNOSIS — Z6841 Body Mass Index (BMI) 40.0 and over, adult: Secondary | ICD-10-CM

## 2019-07-23 DIAGNOSIS — I152 Hypertension secondary to endocrine disorders: Secondary | ICD-10-CM

## 2019-07-23 DIAGNOSIS — E1159 Type 2 diabetes mellitus with other circulatory complications: Secondary | ICD-10-CM

## 2019-07-23 DIAGNOSIS — I10 Essential (primary) hypertension: Secondary | ICD-10-CM

## 2019-07-23 MED ORDER — TOPIRAMATE 50 MG PO TABS: 50.0000 mg | ORAL_TABLET | Freq: Every day | ORAL | 0 refills | Status: DC

## 2019-07-23 MED ORDER — VITAMIN D (ERGOCALCIFEROL) 1.25 MG (50000 UNIT) PO CAPS: 50000.0000 [IU] | ORAL_CAPSULE | ORAL | 0 refills | Status: DC

## 2019-07-23 NOTE — Progress Notes (Signed)
Chief Complaint:   OBESITY [name] is here to discuss her progress with her obesity treatment plan along with follow-up of her obesity related diagnoses. [name] is on the Category 3 Plan and states she is following her eating plan approximately 50% of the time. [name] states she is exercising 0 minutes 0 times per week.  Today's visit was #: 2 Starting weight: 264 lbs Starting date: 06/30/2019 Today's weight: 263 lbs Today's date: 07/23/2019 Total lbs lost to date: 1 Total lbs lost since last in-office visit: 1  Interim History: [name] is down 1 lb. She states that she did well the first week. She did not like the yogurt or cottage cheese.  Subjective:   Vitamin D deficiency. [name] is not taking Vitamin D OTC. Last Vitamin D 24.9 on 06/30/2019.  Type 2 diabetes mellitus with hyperglycemia, without long-term current use of insulin ([place]). [name] is taking Invokana and Victoza.   Lab Results  Component Value Date   HGBA1C 7.6 (H) 06/30/2019   HGBA1C 8.2 (H) 03/05/2019   HGBA1C 7.7 (H) 09/24/2018   Lab Results  Component Value Date   MICROALBUR 10 10/01/2017   LDLCALC 85 06/30/2019   CREATININE 0.84 06/30/2019   Lab Results  Component Value Date   INSULIN 13.8 06/30/2019   Hypertension associated with diabetes ([place]). Blood pressure is controlled.  BP Readings from Last 3 Encounters:  07/23/19 115/70  06/30/19 127/78  06/20/19 130/80   Lab Results  Component Value Date   CREATININE 0.84 06/30/2019   CREATININE 0.59 03/07/2019   CREATININE 0.58 03/06/2019   Other depression, with emotional eating. [name] is struggling with emotional eating and using food for comfort to the extent that it is negatively impacting her health. She has been working on behavior modification techniques to help reduce her emotional eating and has been somewhat successful. She shows no sign of suicidal or homicidal ideations. [name] reports emotional/stress  eating.  Other elevated white blood cell (WBC) count. No fever or chills. [name] reports chronic right ear infection.  Assessment/Plan:   Vitamin D deficiency. Low Vitamin D level contributes to fatigue and are associated with obesity, breast, and colon cancer. She was given a prescription for Vitamin D, Ergocalciferol, (DRISDOL) 1.25 MG (50000 UNIT) CAPS capsule every week #4 with 0 refills and will follow-up for routine testing of Vitamin D, at least 2-3 times per year to avoid over-replacement.    Type 2 diabetes mellitus with hyperglycemia, without long-term current use of insulin ([place]). Good blood sugar control is important to decrease the likelihood of diabetic complications such as nephropathy, neuropathy, limb loss, blindness, coronary artery disease, and death. Intensive lifestyle modification including diet, exercise and weight loss are the first line of treatment for diabetes. [name] will continue her medications as directed.  Hypertension associated with diabetes ([place]). [name] is working on healthy weight loss and exercise to improve blood pressure control. We will watch for signs of hypotension as she continues her lifestyle modifications. She will continue her medication as directed.  Other depression, with emotional eating. Behavior modification techniques were discussed today to help Adream deal with her emotional/non-hunger eating behaviors.  Orders and follow up as documented in patient record. [name] was given a prescription for topiramate (TOPAMAX) 50 MG tablet in the evening #30 with 0 refills.  Other elevated white blood cell (WBC) count. [name] will follow-up with ENT next week (plans to have an "ear patch").  Class 3 severe obesity with serious comorbidity and  body mass index (BMI) of 45.0 to 49.9 in adult, unspecified obesity type ([name]).  [name] is currently in the action stage of change. As such, her goal is to continue with weight loss efforts. She has agreed to  the Category 3 Plan with additional Category 3 breakfast options.   She will work on meal planning, mindful eating, and will alternate protein sources.  Exercise goals: All adults should avoid inactivity. Some physical activity is better than none, and adults who participate in any amount of physical activity gain some health benefits. [name] will do yard work and plans to join [employer].  Behavioral modification strategies: increasing lean protein intake, decreasing simple carbohydrates, increasing vegetables, increasing water intake, decreasing eating out, no skipping meals, meal planning and cooking strategies, keeping healthy foods in the home and planning for success.  [name] has agreed to follow-up with our clinic in 2 weeks. She was informed of the importance of frequent follow-up visits to maximize her success with intensive lifestyle modifications for her multiple health conditions.   Objective:   Blood pressure 115/70, pulse 93, temperature 97.9 F (36.6 C), temperature source Oral, height 5\' 4"  (1.626 m), weight 263 lb (119.3 kg), SpO2 95 %. Body mass index is 45.14 kg/m.  General: Cooperative, alert, well developed, in no acute distress. HEENT: Conjunctivae and lids unremarkable. Cardiovascular: Regular rhythm.  Lungs: Normal work of breathing. Neurologic: No focal deficits.   Lab Results  Component Value Date   CREATININE 0.84 06/30/2019   BUN 8 06/30/2019   NA 140 06/30/2019   K 4.6 06/30/2019   CL 100 06/30/2019   CO2 25 06/30/2019   Lab Results  Component Value Date   ALT 15 06/30/2019   AST 14 06/30/2019   ALKPHOS 85 06/30/2019   BILITOT 0.4 06/30/2019   Lab Results  Component Value Date   HGBA1C 7.6 (H) 06/30/2019   HGBA1C 8.2 (H) 03/05/2019   HGBA1C 7.7 (H) 09/24/2018   HGBA1C 7.8 (H) 02/12/2018   HGBA1C 8.0 (A) 01/10/2018   Lab Results  Component Value Date   INSULIN 13.8 06/30/2019   Lab Results  Component Value Date   TSH 1.860 06/30/2019    Lab Results  Component Value Date   CHOL 151 06/30/2019   HDL 48 06/30/2019   LDLCALC 85 06/30/2019   LDLDIRECT 121 (H) 10/03/2013   TRIG 98 06/30/2019   CHOLHDL 3.4 02/12/2018   Lab Results  Component Value Date   WBC 14.2 (H) 06/30/2019   HGB 14.4 06/30/2019   HCT 44.8 06/30/2019   MCV 80 06/30/2019   PLT 294 06/30/2019   Lab Results  Component Value Date   IRON 45 11/21/2018   TIBC 337 11/21/2018   FERRITIN 91 11/21/2018   Attestation Statements:   Reviewed by clinician on day of visit: allergies, medications, problem list, medical history, surgical history, family history, social history, and previous encounter notes.  Time spent on visit including pre-visit chart review and post-visit charting and care was 30 minutes.   Migdalia Dk, am acting as Location manager for [employer], DO   I have reviewed the above documentation for accuracy and completeness, and I agree with the above. Jearld Lesch, DO

## 2019-07-29 ENCOUNTER — Ambulatory Visit (INDEPENDENT_AMBULATORY_CARE_PROVIDER_SITE_OTHER): Payer: Medicare HMO | Admitting: Otolaryngology

## 2019-07-29 ENCOUNTER — Other Ambulatory Visit: Payer: Self-pay

## 2019-07-29 ENCOUNTER — Encounter (INDEPENDENT_AMBULATORY_CARE_PROVIDER_SITE_OTHER): Payer: Self-pay | Admitting: Otolaryngology

## 2019-07-29 VITALS — Temp 97.9°F

## 2019-07-29 DIAGNOSIS — H7291 Unspecified perforation of tympanic membrane, right ear: Secondary | ICD-10-CM | POA: Diagnosis not present

## 2019-07-29 NOTE — Progress Notes (Signed)
HPI: [name] is a 54 y.o. female who returns today for evaluation of chronic right TM perforation.  She apparently had tubes when she was a child and otherwise does not know what caused the perforation in the right ear..  She has had intermittent drainage from the right ear for years.  She also complains of decreased hearing more so on the right side.  She likes to swim.  I have previously prescribed eardrops which she just started recently.  She presently does not have any more eardrops. She denies any drainage from her ear over the last 2 days..  Past Medical History:  Diagnosis Date  . ADHD   . Alcohol abuse   . Anemia   . Anxiety   . Asthmatic bronchitis    normal PFT/ seen by pulmonary no evidence of COPD  . Atypical chest pain 10/05/2017  . Back pain   . Bilateral swelling of feet   . Chest pain   . Chest pain on respiration 03/25/2014  . Chronic bronchitis ([place])   . Chronic lower back pain   . Chronic respiratory failure ([place]) 10/16/2011   Newly 02 dep 24/7 p discharge from [facility] 01/2013  - 03/13/2014  Walked RA  2 laps @ 185 ft each stopped due to  Sob/ aching in legs, thirsty/ no desat @ slow pace   . Chronic respiratory failure with hypoxia (H[facility])    On 2-3 L of oxygen at home  . Cigarette smoker 12/02/2010   Followed in Pulmonary clinic/ [facility]/ [place]   - Limits of effective care reviewed 12/22/2011    . Constipation   . COPD (chronic obstructive pulmonary disease) ([place])   . COPD with chronic bronchitis ([place]) 10/01/2017  . Daily headache   . Depression   . Diabetic peripheral neuropathy ([place])   . DM (diabetes mellitus) type II controlled, neurological manifestation ([place]) 12/02/2010  . Drug use   . Gallbladder problem   . Gastric erosions    EGD 08/2010.  [name] GERD (gastroesophageal reflux disease)   . Glaucoma   . H/O drug abuse ([place]) 11/12/2017   -- scanned document from outside source: [facility] in [place]  which showed UDS positive for Adderall /amphetamine usage as well as positive urine for THC.  This test result was collected 08/11/2016 and reported 10/07/2016. - lso review of the chart shows another positive amphetamine, THC and METH in the urine back on 10/18/2015 under "care everywhere". ---So   . Heavy menses   . High cholesterol   . History of blood transfusion    "related to low HgB" (10/05/2017)  . History of hiatal hernia   . HTN (hypertension)   . Hyperlipidemia associated with type 2 diabetes mellitus ([place]) 10/18/2015  . Hypertension associated with diabetes ([place]) 12/02/2010   D/c acei 12/22/2011 due to psuedowheeze and narcotic dependent cough> ? Improved - 47/65/4650 started bystolic in place of cozar due to cough    . IBS (irritable bowel syndrome)   . Increased urinary protein excretion   . Internal hemorrhoids    Colonoscopy 5/12.  . Iron deficiency anemia 10/01/2017  . Joint pain   . Mixed diabetic hyperlipidemia associated with type 2 diabetes mellitus ([place]) 10/01/2017  . On home oxygen therapy    "5L at night" (10/05/2017)  . OSA on CPAP   . Osteoarthritis    "back" (10/05/2017)  . Oxygen dependent 10/16/2011  . Pneumonia    "lots of times" (  10/05/2017)  . Poorly controlled diabetes mellitus ([place]) 11/12/2017  . Pulmonary infiltrates 12/22/2011   Followed in Pulmonary clinic/ [facility]/ [place]    - See CT Chest 05/05/11   . Shortness of breath   . Tachycardia    never had test done since no insurance  . Tobacco use disorder-current smoker greater than 40-pack-year history- since age 17 1 ppd 10/01/2017  . Type II diabetes mellitus ([place])   . Vitamin D deficiency    Past Surgical History:  Procedure Laterality Date  . CESAREAN SECTION  1988; 1989  . CHOLECYSTECTOMY OPEN  1990  . COLONOSCOPY  09/16/2010   LDJ:TTSVXB colon/small internal hemorrhoids  . ESOPHAGOGASTRODUODENOSCOPY  09/16/2010   SLF: normal/mild gastritis  . ESOPHAGOGASTRODUODENOSCOPY N/A 10/19/2014    Procedure: ESOPHAGOGASTRODUODENOSCOPY (EGD);  Surgeon: Danie Binder, MD;  Location: [facility] END[facility] SUITE;  Service: Endoscopy;  Laterality: N/A;  830  . FRACTURE SURGERY    . HYSTEROSCOPY WITH THERMACHOICE  01/17/2012   Procedure: HYSTEROSCOPY WITH THERMACHOICE;  Surgeon: Florian Buff, MD;  Location: [facility] ORS;  Service: Gynecology;  Laterality: N/A;  total therapy time: 9:13sec  D5W  18 ml in, D5W   77m out, temperture 87degrees celcious  . KIDNEY SURGERY     as child for blockages  . TONSILLECTOMY    . TUBAL LIGATION  1989  . TYMPANOSTOMY TUBE PLACEMENT Bilateral    "several times when I was a child"  . uterine ablation    . WRIST FRACTURE SURGERY Left 1995   Social History   Socioeconomic History  . Marital status: Married    Spouse name: Not on file  . Number of children: 2  . Years of education: Not on file  . Highest education level: Not on file  Occupational History  . Occupation: unemployed  Tobacco Use  . Smoking status: Current Every Day Smoker    Packs/day: 1.50    Years: 40.00    Pack years: 60.00    Types: Cigarettes    Start date: 89 . Smokeless tobacco: Never Used  Substance and Sexual Activity  . Alcohol use: Yes    Comment: once a month - 3-4 mixed drinks  . Drug use: Yes    Types: Marijuana    Comment: daily use  . Sexual activity: Yes    Partners: Male    Birth control/protection: Surgical  Other Topics Concern  . Not on file  Social History Narrative  . Not on file   Social Determinants of Health   Financial Resource Strain:   . Difficulty of Paying Living Expenses:   Food Insecurity:   . Worried About RCharity fundraiserin the Last Year:   . RArboriculturistin the Last Year:   Transportation Needs:   . LFilm/video editor(Medical):   .Marland KitchenLack of Transportation (Non-Medical):   Physical Activity:   . Days of Exercise per Week:   . Minutes of Exercise per Session:   Stress:   . Feeling of Stress :   Social Connections:   . Frequency of  Communication with Friends and Family:   . Frequency of Social Gatherings with Friends and Family:   . Attends Religious Services:   . Active Member of Clubs or Organizations:   . Attends CArchivistMeetings:   .Marland KitchenMarital Status:    Family History  Problem Relation Age of Onset  . Heart attack Father 443      deceased, etoh use  .  Heart disease Father   . Alcohol abuse Father   . Depression Father   . Heart failure Father   . Sudden death Father   . Heart attack Mother 67       deceased  . Diabetes Mother   . Breast cancer Mother   . Heart failure Mother        oxygen dependence, nonsmoker  . Heart disease Mother   . Depression Mother   . Cancer Mother   . Hypertension Mother   . Hyperlipidemia Mother   . Sudden death Mother   . Sleep apnea Mother   . Obesity Mother   . Liver disease Maternal Aunt 15       died while on liver transplant list  . Heart attack Maternal Grandmother        premature CAD  . Ulcers Sister   . Hypertension Sister   . Heart failure Sister   . Depression Sister   . Anxiety disorder Sister   . Colon cancer Neg Hx    Allergies  Allergen Reactions  . Codeine Itching and Nausea Only  . Metformin And Related Diarrhea and Other (See Comments)    Stomach pain/nausea  . Wellbutrin [Bupropion Hcl] Hives  . Acyclovir And Related Rash   Prior to Admission medications   Medication Sig Start Date End Date Taking? Authorizing Provider  Accu-Chek Softclix Lancets lancets USE UP TO FOUR TIMES DAILY AS DIRECTED 06/20/19  Yes Cirigliano, Mary K, DO  albuterol (PROVENTIL) (2.5 MG/3ML) 0.083% nebulizer solution Take 3 mLs (2.5 mg total) by nebulization every 6 (six) hours as needed for wheezing or shortness of breath. 02/21/19  Yes Malvin Johns, MD  amphetamine-dextroamphetamine (ADDERALL) 30 MG tablet Take 1 tablet by mouth 2 (two) times daily. 06/05/19 06/04/20 Yes Cloria Spring, MD  aspirin EC 81 MG tablet Take 1 tablet (81 mg total) by mouth  daily. 12/07/17  Yes Dunn, Dayna N, PA-C  atorvastatin (LIPITOR) 40 MG tablet Take 1 tablet (40 mg total) by mouth daily. 06/20/19  Yes Cirigliano, Mary K, DO  blood glucose meter kit and supplies Dispense based on patient and insurance preference. Use up to four times daily as directed. (FOR ICD-10 E10.9, E11.9). 06/20/19  Yes Cirigliano, Mary K, DO  DULoxetine (CYMBALTA) 60 MG capsule TAKE 1 CAPSULE(60 MG) BY MOUTH AT BEDTIME 06/05/19  Yes Cloria Spring, MD  fluticasone (FLONASE) 50 MCG/ACT nasal spray USE 1 SPRAY IN EACH NOSTRIL TWICE DAILY AFTER SINUS RINSES Patient taking differently: Place 1 spray into both nostrils daily. USE 1 SPRAY IN EACH NOSTRIL TWICE DAILY AFTER SINUS RINSES 12/11/18  Yes Cirigliano, Mary K, DO  Insulin Pen Needle (BD PEN NEEDLE NANO U/F) 32G X 4 MM MISC USE AS DIRECTED WITH VICTOZA 06/16/19  Yes Cirigliano, Mary K, DO  INVOKANA 300 MG TABS tablet TAKE 1 TABLET(300 MG) BY MOUTH DAILY BEFORE BREAKFAST 05/30/19  Yes Cirigliano, Mary K, DO  isosorbide mononitrate (IMDUR) 30 MG 24 hr tablet TAKE 1 TABLET BY MOUTH EVERY DAY Patient taking differently: Take 30 mg by mouth daily.  02/07/19  Yes Dunn, Dayna N, PA-C  Lancets Misc. (ACCU-CHEK SOFTCLIX LANCET DEV) KIT Check Blood sugar up to 4 times a day 06/20/19  Yes Cirigliano, Mary K, DO  liraglutide (VICTOZA) 18 MG/3ML SOPN INJECT 0.3 MLS (1.8 MG TOTAL) INTO THE SKIN DAILY. Patient taking differently: Inject 1.8 mg into the skin daily. INJECT 0.3 MLS (1.8 MG TOTAL) INTO THE SKIN DAILY. 12/11/18  Yes Cirigliano,  Mary K, DO  losartan (COZAAR) 25 MG tablet Take 1 tablet (25 mg total) by mouth daily. 12/11/18  Yes Cirigliano, Mary K, DO  pantoprazole (PROTONIX) 40 MG tablet Take 1 tablet (40 mg total) by mouth daily. 30 minutes before a meal. 11/21/18 11/16/19 Yes Cirigliano, Mary K, DO  pregabalin (LYRICA) 150 MG capsule Take 1 capsule (150 mg total) by mouth 2 (two) times daily. No ReFills 11/21/18  Yes Cirigliano, Mary K, DO  topiramate  (TOPAMAX) 50 MG tablet Take 1 tablet (50 mg total) by mouth daily. 07/23/19  Yes Jearld Lesch A, DO  Vitamin D, Ergocalciferol, (DRISDOL) 1.25 MG (50000 UNIT) CAPS capsule Take 1 capsule (50,000 Units total) by mouth every 7 (seven) days. 07/23/19  Yes Jearld Lesch A, DO     Positive ROS: Otherwise negative  All other systems have been reviewed and were otherwise negative with the exception of those mentioned in the HPI and as above.  Physical Exam: Constitutional: Alert, well-appearing, no acute distress Ears: External ears without lesions or tenderness.  Left TM has a retraction pocket in the central portion of the TM but she is able to insufflate air behind the left TM without difficulty.  The right TM has a central moderate size right TM perforation.  There is a small amount of clear mucus within the middle ear space but no active drainage noted.  After cleaning this I applied CSF HC powder to the right ear. Nasal: External nose without lesions. Septum midline with moderate rhinitis.  Clear mucus discharge.  No signs of infection.. Clear nasal passages Oral: Lips and gums without lesions. Tongue and palate mucosa without lesions. Posterior oropharynx clear. Neck: No palpable adenopathy or masses Respiratory: Breathing comfortably.  Clear to auscultation Cardiac exam: Regular rate and rhythm without murmur. Skin: No facial/neck lesions or rash noted.  Procedures  Assessment: Chronic right TM perforation with intermittent drainage.  Plan: Discussed with the patient today concerning right tympanoplasty.  Apparently her son had surgery following tubes when he was a child to her.  TM perforation. Recommended keeping water out of the ear. I gave her another prescription for antibiotic eardrops Cipro with dexamethasone otic suspension 4 to 5 drops twice daily for 5 days as needed any drainage from the right ear. We will need to obtain an audiogram prior to surgery. She will follow-up 1 week  prior to surgery for recheck of the perforation to make sure there is no active drainage as well as to review audiogram prior to surgery. She takes a baby aspirin and will need to stop this 5 days prior to surgery.   Radene Journey, MD

## 2019-08-06 ENCOUNTER — Ambulatory Visit (INDEPENDENT_AMBULATORY_CARE_PROVIDER_SITE_OTHER): Payer: Medicare HMO | Admitting: Family Medicine

## 2019-08-11 NOTE — Progress Notes (Signed)
Chart reviewed with Dr. Doroteo Glassman, who believes patient would do better at [facility] OR due to history of OSA and COPD. Olin Hauser at Dr. Pollie Friar office notified.

## 2019-08-12 ENCOUNTER — Other Ambulatory Visit: Payer: Self-pay

## 2019-08-12 ENCOUNTER — Ambulatory Visit (INDEPENDENT_AMBULATORY_CARE_PROVIDER_SITE_OTHER): Payer: Self-pay | Admitting: Otolaryngology

## 2019-08-12 ENCOUNTER — Ambulatory Visit (INDEPENDENT_AMBULATORY_CARE_PROVIDER_SITE_OTHER): Payer: Medicare HMO | Admitting: Otolaryngology

## 2019-08-12 DIAGNOSIS — H7291 Unspecified perforation of tympanic membrane, right ear: Secondary | ICD-10-CM

## 2019-08-12 NOTE — Progress Notes (Signed)
HPI: [name] is a 54 y.o. female who returns today for evaluation of chronic right TM perforation for preop evaluation.  She has not had any drainage from the right ear over the past week or 2.  She has not had her hearing test performed yet but is scheduled for an audiogram on Thursday.  She has been using her antibiotic eardrops.  She has had no pain in the ear but does have decreased hearing in the right ear..  Past Medical History:  Diagnosis Date  . ADHD   . Alcohol abuse   . Anemia   . Anxiety   . Asthmatic bronchitis    normal PFT/ seen by pulmonary no evidence of COPD  . Atypical chest pain 10/05/2017  . Back pain   . Bilateral swelling of feet   . Chest pain   . Chest pain on respiration 03/25/2014  . Chronic bronchitis ([place])   . Chronic lower back pain   . Chronic respiratory failure ([place]) 10/16/2011   Newly 02 dep 24/7 p discharge from [facility] 01/2013  - 03/13/2014  Walked RA  2 laps @ 185 ft each stopped due to  Sob/ aching in legs, thirsty/ no desat @ slow pace   . Chronic respiratory failure with hypoxia (HCC)    On 2-3 L of oxygen at home  . Cigarette smoker 12/02/2010   Followed in Pulmonary clinic/ [facility]/ [place]   - Limits of effective care reviewed 12/22/2011    . Constipation   . COPD (chronic obstructive pulmonary disease) ([place])   . COPD with chronic bronchitis ([place]) 10/01/2017  . Daily headache   . Depression   . Diabetic peripheral neuropathy ([place])   . DM (diabetes mellitus) type II controlled, neurological manifestation ([place]) 12/02/2010  . Drug use   . Gallbladder problem   . Gastric erosions    EGD 08/2010.  [name] GERD (gastroesophageal reflux disease)   . Glaucoma   . H/O drug abuse ([place]) 11/12/2017   -- scanned document from outside source: [facility] in [place] which showed UDS positive for Adderall /amphetamine usage as well as positive urine for THC.  This test result was collected 08/11/2016 and  reported 10/07/2016. - lso review of the chart shows another positive amphetamine, THC and METH in the urine back on 10/18/2015 under "care everywhere". ---So   . Heavy menses   . High cholesterol   . History of blood transfusion    "related to low HgB" (10/05/2017)  . History of hiatal hernia   . HTN (hypertension)   . Hyperlipidemia associated with type 2 diabetes mellitus ([place]) 10/18/2015  . Hypertension associated with diabetes ([place]) 12/02/2010   D/c acei 12/22/2011 due to psuedowheeze and narcotic dependent cough> ? Improved - 66/09/3014 started bystolic in place of cozar due to cough    . IBS (irritable bowel syndrome)   . Increased urinary protein excretion   . Internal hemorrhoids    Colonoscopy 5/12.  . Iron deficiency anemia 10/01/2017  . Joint pain   . Mixed diabetic hyperlipidemia associated with type 2 diabetes mellitus ([place]) 10/01/2017  . On home oxygen therapy    "5L at night" (10/05/2017)  . OSA on CPAP   . Osteoarthritis    "back" (10/05/2017)  . Oxygen dependent 10/16/2011  . Pneumonia    "lots of times" (10/05/2017)  . Poorly controlled diabetes mellitus ([place]) 11/12/2017  . Pulmonary infiltrates 12/22/2011   Followed in Pulmonary clinic/ [facility]/ [place]    - See CT Chest 05/05/11   . Shortness of breath   . Tachycardia    never had test done since no insurance  . Tobacco use disorder-current smoker greater than 40-pack-year history- since age 31 1 ppd 10/01/2017  . Type II diabetes mellitus ([place])   . Vitamin D deficiency    Past Surgical History:  Procedure Laterality Date  . CESAREAN SECTION  1988; 1989  . CHOLECYSTECTOMY OPEN  1990  . COLONOSCOPY  09/16/2010   XFG:HWEXHB colon/small internal hemorrhoids  . ESOPHAGOGASTRODUODENOSCOPY  09/16/2010   SLF: normal/mild gastritis  . ESOPHAGOGASTRODUODENOSCOPY N/A 10/19/2014   Procedure: ESOPHAGOGASTRODUODENOSCOPY (EGD);  Surgeon: Danie Binder, MD;  Location: [facility] ENDO SUITE;  Service: Endoscopy;  Laterality: N/A;  830   . FRACTURE SURGERY    . HYSTEROSCOPY WITH THERMACHOICE  01/17/2012   Procedure: HYSTEROSCOPY WITH THERMACHOICE;  Surgeon: Florian Buff, MD;  Location: [facility] ORS;  Service: Gynecology;  Laterality: N/A;  total therapy time: 9:13sec  D5W  18 ml in, D5W   9m out, temperture 87degrees celcious  . KIDNEY SURGERY     as child for blockages  . TONSILLECTOMY    . TUBAL LIGATION  1989  . TYMPANOSTOMY TUBE PLACEMENT Bilateral    "several times when I was a child"  . uterine ablation    . WRIST FRACTURE SURGERY Left 1995   Social History   Socioeconomic History  . Marital status: Married    Spouse name: Not on file  . Number of children: 2  . Years of education: Not on file  . Highest education level: Not on file  Occupational History  . Occupation: unemployed  Tobacco Use  . Smoking status: Current Every Day Smoker    Packs/day: 1.50    Years: 40.00    Pack years: 60.00    Types: Cigarettes    Start date: 121 . Smokeless tobacco: Never Used  Substance and Sexual Activity  . Alcohol use: Yes    Comment: once a month - 3-4 mixed drinks  . Drug use: Yes    Types: Marijuana    Comment: daily use  . Sexual activity: Yes    Partners: Male    Birth control/protection: Surgical  Other Topics Concern  . Not on file  Social History Narrative  . Not on file   Social Determinants of Health   Financial Resource Strain:   . Difficulty of Paying Living Expenses:   Food Insecurity:   . Worried About RCharity fundraiserin the Last Year:   . RArboriculturistin the Last Year:   Transportation Needs:   . LFilm/video editor(Medical):   .Marland KitchenLack of Transportation (Non-Medical):   Physical Activity:   . Days of Exercise per Week:   . Minutes of Exercise per Session:   Stress:   . Feeling of Stress :   Social Connections:   . Frequency of Communication with Friends and Family:   . Frequency of Social Gatherings with Friends and Family:   . Attends Religious Services:   . Active  Member of Clubs or Organizations:   . Attends CArchivistMeetings:   .Marland KitchenMarital Status:    Family History  Problem Relation Age of Onset  . Heart attack Father 458      deceased, etoh use  . Heart disease Father   . Alcohol abuse Father   . Depression Father   . Heart failure  Father   . Sudden death Father   . Heart attack Mother 13       deceased  . Diabetes Mother   . Breast cancer Mother   . Heart failure Mother        oxygen dependence, nonsmoker  . Heart disease Mother   . Depression Mother   . Cancer Mother   . Hypertension Mother   . Hyperlipidemia Mother   . Sudden death Mother   . Sleep apnea Mother   . Obesity Mother   . Liver disease Maternal Aunt 15       died while on liver transplant list  . Heart attack Maternal Grandmother        premature CAD  . Ulcers Sister   . Hypertension Sister   . Heart failure Sister   . Depression Sister   . Anxiety disorder Sister   . Colon cancer Neg Hx    Allergies  Allergen Reactions  . Codeine Itching and Nausea Only  . Metformin And Related Diarrhea and Other (See Comments)    Stomach pain/nausea  . Wellbutrin [Bupropion Hcl] Hives  . Acyclovir And Related Rash   Prior to Admission medications   Medication Sig Start Date End Date Taking? Authorizing Provider  Accu-Chek Softclix Lancets lancets USE UP TO FOUR TIMES DAILY AS DIRECTED 06/20/19   Cirigliano, Mary K, DO  albuterol (PROVENTIL) (2.5 MG/3ML) 0.083% nebulizer solution Take 3 mLs (2.5 mg total) by nebulization every 6 (six) hours as needed for wheezing or shortness of breath. 02/21/19   Malvin Johns, MD  amphetamine-dextroamphetamine (ADDERALL) 30 MG tablet Take 1 tablet by mouth 2 (two) times daily. 06/05/19 06/04/20  Cloria Spring, MD  aspirin EC 81 MG tablet Take 1 tablet (81 mg total) by mouth daily. 12/07/17   Dunn, Nedra Hai, PA-C  atorvastatin (LIPITOR) 40 MG tablet Take 1 tablet (40 mg total) by mouth daily. 06/20/19   Cirigliano, Garvin Fila, DO   blood glucose meter kit and supplies Dispense based on patient and insurance preference. Use up to four times daily as directed. (FOR ICD-10 E10.9, E11.9). 06/20/19   Cirigliano, Garvin Fila, DO  DULoxetine (CYMBALTA) 60 MG capsule TAKE 1 CAPSULE(60 MG) BY MOUTH AT BEDTIME 06/05/19   Cloria Spring, MD  fluticasone [facility]) 50 MCG/ACT nasal spray USE 1 SPRAY IN EACH NOSTRIL TWICE DAILY AFTER SINUS RINSES Patient taking differently: Place 1 spray into both nostrils daily. USE 1 SPRAY IN EACH NOSTRIL TWICE DAILY AFTER SINUS RINSES 12/11/18   Cirigliano, Mary K, DO  Insulin Pen Needle (BD PEN NEEDLE NANO U/F) 32G X 4 MM MISC USE AS DIRECTED WITH VICTOZA 06/16/19   Cirigliano, Mary K, DO  INVOKANA 300 MG TABS tablet TAKE 1 TABLET(300 MG) BY MOUTH DAILY BEFORE BREAKFAST 05/30/19   Cirigliano, Mary K, DO  isosorbide mononitrate (IMDUR) 30 MG 24 hr tablet TAKE 1 TABLET BY MOUTH EVERY DAY Patient taking differently: Take 30 mg by mouth daily.  02/07/19   Dunn, Nedra Hai, PA-C  Lancets Misc. (ACCU-CHEK SOFTCLIX LANCET DEV) KIT Check Blood sugar up to 4 times a day 06/20/19   Cirigliano, Mary K, DO  liraglutide (VICTOZA) 18 MG/3ML SOPN INJECT 0.3 MLS (1.8 MG TOTAL) INTO THE SKIN DAILY. Patient taking differently: Inject 1.8 mg into the skin daily. INJECT 0.3 MLS (1.8 MG TOTAL) INTO THE SKIN DAILY. 12/11/18   Cirigliano, Garvin Fila, DO  losartan (COZAAR) 25 MG tablet Take 1 tablet (25 mg total) by mouth daily. 12/11/18  Cirigliano, Mary K, DO  pantoprazole (PROTONIX) 40 MG tablet Take 1 tablet (40 mg total) by mouth daily. 30 minutes before a meal. 11/21/18 11/16/19  Cirigliano, Garvin Fila, DO  pregabalin (LYRICA) 150 MG capsule Take 1 capsule (150 mg total) by mouth 2 (two) times daily. No ReFills 11/21/18   Cirigliano, Mary K, DO  topiramate (TOPAMAX) 50 MG tablet Take 1 tablet (50 mg total) by mouth daily. 07/23/19   Georgia Lopes, DO  Vitamin D, Ergocalciferol, (DRISDOL) 1.25 MG (50000 UNIT) CAPS capsule Take 1 capsule (50,000  Units total) by mouth every 7 (seven) days. 07/23/19   Jearld Lesch A, DO     Positive ROS: Otherwise negative  All other systems have been reviewed and were otherwise negative with the exception of those mentioned in the HPI and as above.  Physical Exam: Constitutional: Alert, well-appearing, no acute distress Ears: External ears without lesions or tenderness.  On microscopic examination the right TM reveals a central dry TM perforation.  She had a little bit of clear mucus within the middle ear space but no real signs of infection.  This was cleaned with suction and applied CSF HC powder to the right middle ear space. Nasal: External nose without lesions. Clear nasal passages Oral: Lips and gums without lesions. Tongue and palate mucosa without lesions. Posterior oropharynx clear. Neck: No palpable adenopathy or masses Respiratory: Breathing comfortably.  Clear to auscultation Cardiac exam: Regular rate and rhythm without murmur Skin: No facial/neck lesions or rash noted.  Procedures  Assessment: Chronic right TM perforation  Plan: Stressed with her concerning keeping all water out of the right ear. She is scheduled for a audiogram on Thursday and will have this faxed to [place] or bring by the office for me to review on Friday prior to surgery. Surgery is scheduled next week at [facility] her a prescription for oxycodone 5 mg tablets #10 1 every 6 hours as needed pain for postop.  Also prescribed Keflex 500 mg twice daily for 1 week.   Radene Journey, MD

## 2019-08-12 NOTE — H&P (Signed)
PREOPERATIVE H&P  Chief Complaint: Chronic right TM perforation  HPI: [name] is a 54 y.o. female who presents for evaluation of chronic right TM perforation with intermittent drainage.  She has noted worse hearing on the right side.  On examination she has a moderate sized central TM perforation with no active drainage presently.  She is taken to the operating room at this time for right tympanoplasty.  Past Medical History:  Diagnosis Date  . ADHD   . Alcohol abuse   . Anemia   . Anxiety   . Asthmatic bronchitis    normal PFT/ seen by pulmonary no evidence of COPD  . Atypical chest pain 10/05/2017  . Back pain   . Bilateral swelling of feet   . Chest pain   . Chest pain on respiration 03/25/2014  . Chronic bronchitis ([place])   . Chronic lower back pain   . Chronic respiratory failure ([place]) 10/16/2011   Newly 02 dep 24/7 p discharge from [facility] 01/2013  - 03/13/2014  Walked RA  2 laps @ 185 ft each stopped due to  Sob/ aching in legs, thirsty/ no desat @ slow pace   . Chronic respiratory failure with hypoxia (HCC)    On 2-3 L of oxygen at home  . Cigarette smoker 12/02/2010   Followed in Pulmonary clinic/ [facility]/ [place]   - Limits of effective care reviewed 12/22/2011    . Constipation   . COPD (chronic obstructive pulmonary disease) ([place])   . COPD with chronic bronchitis ([place]) 10/01/2017  . Daily headache   . Depression   . Diabetic peripheral neuropathy ([place])   . DM (diabetes mellitus) type II controlled, neurological manifestation ([place]) 12/02/2010  . Drug use   . Gallbladder problem   . Gastric erosions    EGD 08/2010.  [name] GERD (gastroesophageal reflux disease)   . Glaucoma   . H/O drug abuse ([place]) 11/12/2017   -- scanned document from outside source: [facility] in [place] which showed UDS positive for Adderall /amphetamine usage as well as positive urine for THC.  This test result was collected 08/11/2016 and  reported 10/07/2016. - lso review of the chart shows another positive amphetamine, THC and METH in the urine back on 10/18/2015 under "care everywhere". ---So   . Heavy menses   . High cholesterol   . History of blood transfusion    "related to low HgB" (10/05/2017)  . History of hiatal hernia   . HTN (hypertension)   . Hyperlipidemia associated with type 2 diabetes mellitus ([place]) 10/18/2015  . Hypertension associated with diabetes ([place]) 12/02/2010   D/c acei 12/22/2011 due to psuedowheeze and narcotic dependent cough> ? Improved - 35/57/3220 started bystolic in place of cozar due to cough    . IBS (irritable bowel syndrome)   . Increased urinary protein excretion   . Internal hemorrhoids    Colonoscopy 5/12.  . Iron deficiency anemia 10/01/2017  . Joint pain   . Mixed diabetic hyperlipidemia associated with type 2 diabetes mellitus ([place]) 10/01/2017  . On home oxygen therapy    "5L at night" (10/05/2017)  . OSA on CPAP   . Osteoarthritis    "back" (10/05/2017)  . Oxygen dependent 10/16/2011  . Pneumonia    "lots of times" (10/05/2017)  . Poorly controlled diabetes mellitus ([place]) 11/12/2017  . Pulmonary infiltrates 12/22/2011   Followed in Pulmonary clinic/ [facility]/ [place]    - See CT Chest 05/05/11   .  Shortness of breath   . Tachycardia    never had test done since no insurance  . Tobacco use disorder-current smoker greater than 40-pack-year history- since age 26 1 ppd 10/01/2017  . Type II diabetes mellitus ([place])   . Vitamin D deficiency    Past Surgical History:  Procedure Laterality Date  . CESAREAN SECTION  1988; 1989  . CHOLECYSTECTOMY OPEN  1990  . COLONOSCOPY  09/16/2010   [id number] colon/small internal hemorrhoids  . ESOPHAGOGASTRODUODENOSCOPY  09/16/2010   SLF: normal/mild gastritis  . ESOPHAGOGASTRODUODENOSCOPY N/A 10/19/2014   Procedure: ESOPHAGOGASTRODUODENOSCOPY (EGD);  Surgeon: Danie Binder, MD;  Location: [facility];  Service: Endoscopy;  Laterality: N/A;  830   . FRACTURE SURGERY    . HYSTEROSCOPY WITH THERMACHOICE  01/17/2012   Procedure: HYSTEROSCOPY WITH THERMACHOICE;  Surgeon: Florian Buff, MD;  Location: [facility] ORS;  Service: Gynecology;  Laterality: N/A;  total therapy time: 9:13sec  D5W  18 ml in, D5W   49m out, temperture 87degrees celcious  . KIDNEY SURGERY     as child for blockages  . TONSILLECTOMY    . TUBAL LIGATION  1989  . TYMPANOSTOMY TUBE PLACEMENT Bilateral    "several times when I was a child"  . uterine ablation    . WRIST FRACTURE SURGERY Left 1995   Social History   Socioeconomic History  . Marital status: Married    Spouse name: Not on file  . Number of children: 2  . Years of education: Not on file  . Highest education level: Not on file  Occupational History  . Occupation: unemployed  Tobacco Use  . Smoking status: Current Every Day Smoker    Packs/day: 1.50    Years: 40.00    Pack years: 60.00    Types: Cigarettes    Start date: 89 . Smokeless tobacco: Never Used  Substance and Sexual Activity  . Alcohol use: Yes    Comment: once a month - 3-4 mixed drinks  . Drug use: Yes    Types: Marijuana    Comment: daily use  . Sexual activity: Yes    Partners: Male    Birth control/protection: Surgical  Other Topics Concern  . Not on file  Social History Narrative  . Not on file   Social Determinants of Health   Financial Resource Strain:   . Difficulty of Paying Living Expenses:   Food Insecurity:   . Worried About RCharity fundraiserin the Last Year:   . RArboriculturistin the Last Year:   Transportation Needs:   . LFilm/video editor(Medical):   .Marland KitchenLack of Transportation (Non-Medical):   Physical Activity:   . Days of Exercise per Week:   . Minutes of Exercise per Session:   Stress:   . Feeling of Stress :   Social Connections:   . Frequency of Communication with Friends and Family:   . Frequency of Social Gatherings with Friends and Family:   . Attends Religious Services:   . Active  Member of Clubs or Organizations:   . Attends CArchivistMeetings:   .Marland KitchenMarital Status:    Family History  Problem Relation Age of Onset  . Heart attack Father 436      deceased, etoh use  . Heart disease Father   . Alcohol abuse Father   . Depression Father   . Heart failure Father   . Sudden death Father   . Heart attack Mother  88       deceased  . Diabetes Mother   . Breast cancer Mother   . Heart failure Mother        oxygen dependence, nonsmoker  . Heart disease Mother   . Depression Mother   . Cancer Mother   . Hypertension Mother   . Hyperlipidemia Mother   . Sudden death Mother   . Sleep apnea Mother   . Obesity Mother   . Liver disease Maternal Aunt 66       died while on liver transplant list  . Heart attack Maternal Grandmother        premature CAD  . Ulcers Sister   . Hypertension Sister   . Heart failure Sister   . Depression Sister   . Anxiety disorder Sister   . Colon cancer Neg Hx    Allergies  Allergen Reactions  . Codeine Itching and Nausea Only  . Metformin And Related Diarrhea and Other (See Comments)    Stomach pain/nausea  . Wellbutrin [Bupropion Hcl] Hives  . Acyclovir And Related Rash   Prior to Admission medications   Medication Sig Start Date End Date Taking? Authorizing Provider  Accu-Chek Softclix Lancets lancets USE UP TO FOUR TIMES DAILY AS DIRECTED 06/20/19   Cirigliano, Mary K, DO  albuterol (PROVENTIL) (2.5 MG/3ML) 0.083% nebulizer solution Take 3 mLs (2.5 mg total) by nebulization every 6 (six) hours as needed for wheezing or shortness of breath. 02/21/19   Malvin Johns, MD  amphetamine-dextroamphetamine (ADDERALL) 30 MG tablet Take 1 tablet by mouth 2 (two) times daily. 06/05/19 06/04/20  Cloria Spring, MD  aspirin EC 81 MG tablet Take 1 tablet (81 mg total) by mouth daily. 12/07/17   Dunn, Nedra Hai, PA-C  atorvastatin (LIPITOR) 40 MG tablet Take 1 tablet (40 mg total) by mouth daily. 06/20/19   Cirigliano, Garvin Fila, DO   blood glucose meter kit and supplies Dispense based on patient and insurance preference. Use up to four times daily as directed. (FOR ICD-10 E10.9, E11.9). 06/20/19   Cirigliano, Garvin Fila, DO  DULoxetine (CYMBALTA) 60 MG capsule TAKE 1 CAPSULE(60 MG) BY MOUTH AT BEDTIME 06/05/19   Cloria Spring, MD  fluticasone [facility]) 50 MCG/ACT nasal spray USE 1 SPRAY IN EACH NOSTRIL TWICE DAILY AFTER SINUS RINSES Patient taking differently: Place 1 spray into both nostrils daily. USE 1 SPRAY IN EACH NOSTRIL TWICE DAILY AFTER SINUS RINSES 12/11/18   Cirigliano, Mary K, DO  Insulin Pen Needle (BD PEN NEEDLE NANO U/F) 32G X 4 MM MISC USE AS DIRECTED WITH VICTOZA 06/16/19   Cirigliano, Mary K, DO  INVOKANA 300 MG TABS tablet TAKE 1 TABLET(300 MG) BY MOUTH DAILY BEFORE BREAKFAST 05/30/19   Cirigliano, Mary K, DO  isosorbide mononitrate (IMDUR) 30 MG 24 hr tablet TAKE 1 TABLET BY MOUTH EVERY DAY Patient taking differently: Take 30 mg by mouth daily.  02/07/19   Dunn, Nedra Hai, PA-C  Lancets Misc. (ACCU-CHEK SOFTCLIX LANCET DEV) KIT Check Blood sugar up to 4 times a day 06/20/19   Cirigliano, Mary K, DO  liraglutide (VICTOZA) 18 MG/3ML SOPN INJECT 0.3 MLS (1.8 MG TOTAL) INTO THE SKIN DAILY. Patient taking differently: Inject 1.8 mg into the skin daily. INJECT 0.3 MLS (1.8 MG TOTAL) INTO THE SKIN DAILY. 12/11/18   Cirigliano, Garvin Fila, DO  losartan (COZAAR) 25 MG tablet Take 1 tablet (25 mg total) by mouth daily. 12/11/18   Cirigliano, Mary K, DO  pantoprazole (PROTONIX) 40 MG tablet Take  1 tablet (40 mg total) by mouth daily. 30 minutes before a meal. 11/21/18 11/16/19  Cirigliano, Garvin Fila, DO  pregabalin (LYRICA) 150 MG capsule Take 1 capsule (150 mg total) by mouth 2 (two) times daily. No ReFills 11/21/18   Cirigliano, Mary K, DO  topiramate (TOPAMAX) 50 MG tablet Take 1 tablet (50 mg total) by mouth daily. 07/23/19   Georgia Lopes, DO  Vitamin D, Ergocalciferol, (DRISDOL) 1.25 MG (50000 UNIT) CAPS capsule Take 1 capsule (50,000  Units total) by mouth every 7 (seven) days. 07/23/19   Jearld Lesch A, DO     Positive ROS: Otherwise negative  All other systems have been reviewed and were otherwise negative with the exception of those mentioned in the HPI and as above.  Physical Exam: There were no vitals filed for this visit.  General: Alert, no acute distress Oral: Normal oral mucosa and tonsils Nasal: Clear nasal passages Neck: No palpable adenopathy or thyroid nodules Ear: Ear canal is clear.  On microscopic examination patient has a central right TM perforation with no active drainage.  Occupies about 30% of the TM. Cardiovascular: Regular rate and rhythm, no murmur.  Respiratory: Clear to auscultation Neurologic: Alert and oriented x 3   Assessment/Plan: Chronic right TM perforation  Plan for right tympanoplasty   Melony Overly, MD 08/12/2019 5:38 PM

## 2019-08-13 ENCOUNTER — Encounter: Payer: Self-pay | Admitting: Family Medicine

## 2019-08-13 ENCOUNTER — Other Ambulatory Visit: Payer: Self-pay

## 2019-08-13 ENCOUNTER — Other Ambulatory Visit: Payer: Self-pay | Admitting: Family Medicine

## 2019-08-13 MED ORDER — FLUTICASONE PROPIONATE 50 MCG/ACT NA SUSP: NASAL | 2 refills | Status: DC

## 2019-08-14 NOTE — Progress Notes (Signed)
[facility] RE:[id number] [place], [place] AT [place] [address] [place] [address] Phone: [phone] Fax: [phone]      Your procedure is scheduled on August 18, 2019.  Report to [facility] Main Entrance "A" at 5:30 A.M., and check in at the Admitting office.  Call this number if you have problems the morning of surgery:  [phone]  Call [phone] if you have any questions prior to your surgery date Monday-Friday 8am-4pm    Remember:  Do not eat or drink after midnight the night before your surgery    Take these medicines the morning of surgery with A SIP OF WATER: atorvastatin (LIPITOR) cycloSPORINE (RESTASIS) fluticasone (FLONASE) isosorbide mononitrate (IMDUR) pantoprazole (PROTONIX) pregabalin (LYRICA) albuterol (PROVENTIL) - if needed ciprofloxacin-dexamethasone (CIPRODEX) - ear drops as needed  As of today, STOP taking any Aspirin (unless otherwise instructed by your surgeon) and Aspirin containing products, Aleve, Naproxen, Ibuprofen, Motrin, Advil, Goody's, BC's, all herbal medications, fish oil, and all vitamins.   WHAT DO I DO ABOUT MY DIABETES MEDICATION?   [name] Do not take oral diabetes medicines (pills) the morning of surgery. . Do Not take Invokana and Victoza day of surgery.   HOW TO MANAGE YOUR DIABETES BEFORE AND AFTER SURGERY  Why is it important to control my blood sugar before and after surgery? . Improving blood sugar levels before and after surgery helps healing and can limit problems. . A way of improving blood sugar control is eating a healthy diet by: o  Eating less sugar and carbohydrates o  Increasing activity/exercise o  Talking with your doctor about reaching your blood sugar goals . High blood sugars (greater than 180 mg/dL) can raise your risk of infections and slow your recovery, so you will need to focus on controlling your diabetes during the weeks before  surgery. . Make sure that the doctor who takes care of your diabetes knows about your planned surgery including the date and location.  How do I manage my blood sugar before surgery? . Check your blood sugar at least 4 times a day, starting 2 days before surgery, to make sure that the level is not too high or low. . Check your blood sugar the morning of your surgery when you wake up and every 2 hours until you get to the Short Stay unit. o If your blood sugar is less than 70 mg/dL, you will need to treat for low blood sugar: - Do not take insulin. - Treat a low blood sugar (less than 70 mg/dL) with  cup of clear juice (cranberry or apple), 4 glucose tablets, OR glucose gel. - Recheck blood sugar in 15 minutes after treatment (to make sure it is greater than 70 mg/dL). If your blood sugar is not greater than 70 mg/dL on recheck, call [phone] for further instructions. . Report your blood sugar to the short stay nurse when you get to Short Stay.  . If you are admitted to the hospital after surgery: o Your blood sugar will be checked by the staff and you will probably be given insulin after surgery (instead of oral diabetes medicines) to make sure you have good blood sugar levels. o The goal for blood sugar control after surgery is 80-180 mg/dL.                     Do not wear jewelry, make up, or nail polish  Do not wear lotions, powders, perfumes or deodorant.            Do not shave 48 hours prior to surgery.             Do not bring valuables to the hospital.            [facility] is not responsible for any belongings or valuables.  Do NOT Smoke (Tobacco/Vapping) or drink Alcohol 24 hours prior to your procedure If you use a CPAP at night, you may bring all equipment for your overnight stay.   Contacts, glasses, dentures or bridgework may not be worn into surgery.      For patients admitted to the hospital, discharge time will be determined by your treatment team.    Patients discharged the day of surgery will not be allowed to drive home, and someone needs to stay with them for 24 hours.    Special instructions:   [place]- Preparing For Surgery  Before surgery, you can play an important role. Because skin is not sterile, your skin needs to be as free of germs as possible. You can reduce the number of germs on your skin by washing with CHG (chlorahexidine gluconate) Soap before surgery.  CHG is an antiseptic cleaner which kills germs and bonds with the skin to continue killing germs even after washing.    Oral Hygiene is also important to reduce your risk of infection.  Remember - BRUSH YOUR TEETH THE MORNING OF SURGERY WITH YOUR REGULAR TOOTHPASTE  Please do not use if you have an allergy to CHG or antibacterial soaps. If your skin becomes reddened/irritated stop using the CHG.  Do not shave (including legs and underarms) for at least 48 hours prior to first CHG shower. It is OK to shave your face.  Please follow these instructions carefully.   1. Shower the NIGHT BEFORE SURGERY and the MORNING OF SURGERY with CHG Soap.   2. If you chose to wash your hair, wash your hair first as usual with your normal shampoo.  3. After you shampoo, rinse your hair and body thoroughly to remove the shampoo.  4. Use CHG as you would any other liquid soap. You can apply CHG directly to the skin and wash gently with a scrungie or a clean washcloth.   5. Apply the CHG Soap to your body ONLY FROM THE NECK DOWN.  Do not use on open wounds or open sores. Avoid contact with your eyes, ears, mouth and genitals (private parts). Wash Face and genitals (private parts)  with your normal soap.   6. Wash thoroughly, paying special attention to the area where your surgery will be performed.  7. Thoroughly rinse your body with warm water from the neck down.  8. DO NOT shower/wash with your normal soap after using and rinsing off the CHG Soap.  9. Pat yourself dry with a  CLEAN TOWEL.  10. Wear CLEAN PAJAMAS to bed the night before surgery, wear comfortable clothes the morning of surgery  11. Place CLEAN SHEETS on your bed the night of your first shower and DO NOT SLEEP WITH PETS.   Day of Surgery:   Do not apply any deodorants/lotions.  Please wear clean clothes to the hospital/surgery center.   Remember to brush your teeth WITH YOUR REGULAR TOOTHPASTE.   Please read over the following fact sheets that you were given.

## 2019-08-15 ENCOUNTER — Other Ambulatory Visit (HOSPITAL_COMMUNITY)
Admission: RE | Admit: 2019-08-15 | Discharge: 2019-08-15 | Disposition: A | Discharge status: Home / Self Care | Payer: Medicare HMO | Source: Ambulatory Visit | Attending: Otolaryngology | Admitting: Otolaryngology

## 2019-08-15 ENCOUNTER — Encounter (HOSPITAL_COMMUNITY): Payer: Self-pay

## 2019-08-15 ENCOUNTER — Encounter (HOSPITAL_COMMUNITY)
Admission: RE | Admit: 2019-08-15 | Discharge: 2019-08-15 | Disposition: A | Discharge status: Home / Self Care | Payer: Medicare HMO | Source: Ambulatory Visit | Attending: Otolaryngology | Admitting: Otolaryngology

## 2019-08-15 ENCOUNTER — Other Ambulatory Visit: Payer: Self-pay

## 2019-08-15 ENCOUNTER — Encounter (HOSPITAL_COMMUNITY): Payer: Self-pay | Admitting: Physician Assistant

## 2019-08-15 DIAGNOSIS — Z20822 Contact with and (suspected) exposure to covid-19: Secondary | ICD-10-CM | POA: Diagnosis not present

## 2019-08-15 DIAGNOSIS — Z01818 Encounter for other preprocedural examination: Secondary | ICD-10-CM | POA: Insufficient documentation

## 2019-08-15 DIAGNOSIS — R931 Abnormal findings on diagnostic imaging of heart and coronary circulation: Secondary | ICD-10-CM | POA: Insufficient documentation

## 2019-08-15 HISTORY — DX: Other specified postprocedural states: Z98.890

## 2019-08-15 HISTORY — DX: Other complications of anesthesia, initial encounter: T88.59XA

## 2019-08-15 HISTORY — DX: Nausea with vomiting, unspecified: R11.2

## 2019-08-15 LAB — COMPREHENSIVE METABOLIC PANEL
ALT: 16 U/L (ref 0–44)
AST: 16 U/L (ref 15–41)
Albumin: 3.6 g/dL (ref 3.5–5.0)
Alkaline Phosphatase: 62 U/L (ref 38–126)
Anion gap: 9 (ref 5–15)
BUN: 9 mg/dL (ref 6–20)
CO2: 27 mmol/L (ref 22–32)
Calcium: 9.3 mg/dL (ref 8.9–10.3)
Chloride: 102 mmol/L (ref 98–111)
Creatinine, Ser: 0.56 mg/dL (ref 0.44–1.00)
GFR calc Af Amer: >60 mL/min (ref >60)
GFR calc non Af Amer: >60 mL/min (ref >60)
Glucose, Bld: 152 mg/dL — ABNORMAL HIGH (ref 70–99)
Potassium: 3.9 mmol/L (ref 3.5–5.1)
Sodium: 138 mmol/L (ref 135–145)
Total Bilirubin: 0.5 mg/dL (ref 0.3–1.2)
Total Protein: 6.8 g/dL (ref 6.5–8.1)

## 2019-08-15 LAB — CBC
HCT: 47.7 % — ABNORMAL HIGH (ref 36.0–46.0)
Hemoglobin: 14.4 g/dL (ref 12.0–15.0)
MCH: 24.9 pg — ABNORMAL LOW (ref 26.0–34.0)
MCHC: 30.2 g/dL (ref 30.0–36.0)
MCV: 82.5 fL (ref 80.0–100.0)
Platelets: 309 10*3/uL (ref 150–400)
RBC: 5.78 MIL/uL — ABNORMAL HIGH (ref 3.87–5.11)
RDW: 16.8 % — ABNORMAL HIGH (ref 11.5–15.5)
WBC: 14.1 10*3/uL — ABNORMAL HIGH (ref 4.0–10.5)
nRBC: 0 % (ref 0.0–0.2)

## 2019-08-15 LAB — GLUCOSE, CAPILLARY: Glucose-Capillary: 182 mg/dL — ABNORMAL HIGH (ref 70–99)

## 2019-08-15 LAB — SARS CORONAVIRUS 2 (TAT 6-24 HRS): SARS Coronavirus 2: NEGATIVE

## 2019-08-15 NOTE — Progress Notes (Signed)
Anesthesia Chart Review:  On chart review pt noted to have abnormal CTA FFR in 2019 with no cardiology followup. Called and discussed with Dr. Lucia Gaskins. Pt will need cardiology eval and clearance prior to elective surgery. Called and spoke with patient, she understood need for preop optimization and will call cardiology to schedule.  [facility] Short Stay Center/Anesthesiology Phone [phone] 08/18/2019 4:05 PM

## 2019-08-15 NOTE — Progress Notes (Signed)
PCP:  Letta Median, DO Cardiologist:  Cecilie Kicks, NP  EKG:   06/30/19 CXR:  03/08/20 ECHO:  10/06/17 Stress Test:  Denies Cardiac Cath:  denies  Fasting Blood Sugar-  155-210 Checks Blood Sugar__1_ times a day  Anesthesia Review:  Yes, saw cards in July 2019 for chest pain.  Had echo with EF 55-60%.  Patient stated she thinks she wanted her to go for further testing but not sure.   Covid test 08/15/19  Patient denies shortness of breath, fever, cough, and chest pain at PAT appointment.  Patient verbalized understanding of instructions provided today at the PAT appointment.  Patient asked to review instructions at home and day of surgery.

## 2019-08-17 ENCOUNTER — Other Ambulatory Visit (INDEPENDENT_AMBULATORY_CARE_PROVIDER_SITE_OTHER): Payer: Self-pay | Admitting: Bariatrics

## 2019-08-17 DIAGNOSIS — E559 Vitamin D deficiency, unspecified: Secondary | ICD-10-CM

## 2019-08-18 ENCOUNTER — Telehealth: Payer: Self-pay | Admitting: *Deleted

## 2019-08-18 ENCOUNTER — Encounter (HOSPITAL_COMMUNITY): Admission: RE | Payer: Self-pay | Source: Home / Self Care

## 2019-08-18 ENCOUNTER — Ambulatory Visit (HOSPITAL_COMMUNITY): Admission: RE | Admit: 2019-08-18 | Payer: Medicare HMO | Source: Home / Self Care | Admitting: Otolaryngology

## 2019-08-18 SURGERY — TYMPANOPLASTY
Anesthesia: General | Laterality: Right

## 2019-08-18 NOTE — Telephone Encounter (Signed)
   [facility] Pre-operative Risk Assessment    Request for surgical clearance: LOOKS LIKE THIS PT IS GOING TO NEED A NEW PT APPT. MESSAGE HAS BEEN SENT TO SCHEDULE TEAM TO ARRANGE NEW PT APPT.   1. What type of surgery is being performed? RIGHT EAR TYMPANOPLASTY   2. When is this surgery scheduled? TBD   3. What type of clearance is required (medical clearance vs. Pharmacy clearance to hold med vs. Both)? MEDICAL  4. Are there any medications that need to be held prior to surgery and how long? ASA   5. Practice name and name of physician performing surgery? DR. Gerald Stabs [facility]   6. What is your office phone number [phone]    7.   What is your office fax number [phone]  8.   Anesthesia type (None, local, MAC, general) ? GENERAL   Julaine Hua 08/18/2019, 8:23 AM  _________________________________________________________________   (provider comments below)

## 2019-08-20 ENCOUNTER — Ambulatory Visit: Payer: Medicare HMO | Admitting: Adult Health

## 2019-08-25 NOTE — Progress Notes (Signed)
No show

## 2019-08-26 ENCOUNTER — Ambulatory Visit (INDEPENDENT_AMBULATORY_CARE_PROVIDER_SITE_OTHER): Payer: Medicare HMO | Admitting: Cardiovascular Disease

## 2019-08-26 DIAGNOSIS — Z91199 Patient's noncompliance with other medical treatment and regimen due to unspecified reason: Secondary | ICD-10-CM | POA: Insufficient documentation

## 2019-08-26 DIAGNOSIS — Z5329 Procedure and treatment not carried out because of patient's decision for other reasons: Secondary | ICD-10-CM

## 2019-08-28 ENCOUNTER — Other Ambulatory Visit: Payer: Self-pay | Admitting: Family Medicine

## 2019-08-28 DIAGNOSIS — E1129 Type 2 diabetes mellitus with other diabetic kidney complication: Secondary | ICD-10-CM

## 2019-08-28 DIAGNOSIS — R809 Proteinuria, unspecified: Secondary | ICD-10-CM

## 2019-08-28 NOTE — Telephone Encounter (Signed)
MC-I'm unsure about refills for pt/has seen 3 family med providers for the same issues? Plz advise/thx dmf

## 2019-08-28 NOTE — Telephone Encounter (Signed)
Last OV 07/23/19 Last fill 05/30/19  #90/1

## 2019-08-29 ENCOUNTER — Ambulatory Visit: Payer: Medicare HMO | Admitting: Adult Health

## 2019-08-29 ENCOUNTER — Other Ambulatory Visit: Payer: Self-pay

## 2019-08-29 ENCOUNTER — Encounter: Payer: Self-pay | Admitting: Adult Health

## 2019-08-29 VITALS — BP 122/60 | HR 54 | Ht 64.0 in | Wt 264.0 lb

## 2019-08-29 DIAGNOSIS — Z9989 Dependence on other enabling machines and devices: Secondary | ICD-10-CM

## 2019-08-29 DIAGNOSIS — G4733 Obstructive sleep apnea (adult) (pediatric): Secondary | ICD-10-CM

## 2019-08-29 DIAGNOSIS — J449 Chronic obstructive pulmonary disease, unspecified: Secondary | ICD-10-CM

## 2019-08-29 NOTE — Progress Notes (Signed)
_0  ID: [name], female    DOB: [birth year], 54 y.o.   MRN: [id number]    Referring provider: Ronnald Nian, DO  HPI: 54 year old female followed for obstructive sleep apnea and Chronic bronchitis  Medical history significant for diabetes, depression, drug overdose of Xanax November 2020  TEST/EVENTS :  PFT's 03/18/2012 >spirometry nml 11/2017 CT cors >>lingular & RML scarring  NPSG 09/2013 >>AHI 73/hour 7/2015CPAP titration 10 cm-282 pounds  08/29/19 Follow up : OSA and Chronic bronchitis  Patient returns for a 19-monthfollow-up.  Patient was seen last visit.  Has known underlying sleep apnea.  She had been off of a CPAP for some time.  Patient was set up for a home sleep study that showed moderate sleep apnea with AHI 23/hour.  She was started on CPAP.  Patient says she has been doing well since starting on CPAP.  She is wearing it every night getting about 8 to 9 hours on average.  Patient's download shows excellent compliance with 9 hours of usage daily.  100% compliance.  AHI 0.4.  Minimal leaks.  Patient says she does feel more rested and has decreased daytime sleepiness.  Patient does carry diagnosis of chronic bronchitis.  Previous spirometry in 2013 was normal.  Has occasional cough and shortness of breath.  Uses albuterol on occasion.  She does continue to smoke.  Smoking cessation was discussed.  We discussed repeating her pulmonary function testing.   Allergies  Allergen Reactions  . Codeine Itching and Nausea Only  . Metformin And Related Diarrhea and Other (See Comments)    Stomach pain/nausea  . Wellbutrin [Bupropion Hcl] Hives  . Acyclovir And Related Rash    Immunization History  Administered Date(s) Administered  . Influenza Split 01/11/2012, 04/08/2016  . Influenza,inj,Quad PF,6+ Mos 12/31/2012, 01/16/2014, 03/10/2015, 03/11/2018, 03/13/2019  . Influenza-Unspecified 01/29/2017  . Pneumococcal Polysaccharide-23 02/20/2012  .  Tdap 03/11/2018    Past Medical History:  Diagnosis Date  . ADHD   . Alcohol abuse   . Anemia   . Anxiety   . Asthmatic bronchitis    normal PFT/ seen by pulmonary no evidence of COPD  . Atypical chest pain 10/05/2017  . Back pain   . Bilateral swelling of feet   . Chest pain   . Chest pain on respiration 03/25/2014  . Chronic bronchitis ([place]   . Chronic lower back pain   . Chronic respiratory failure ([place] 10/16/2011   [facility]  - 03/13/2014  Walked RA  2 laps @ 185 ft each stopped due to  Sob/ aching in legs, thirsty/ no desat @ slow pace   . Chronic respiratory failure with hypoxia (HCC)    On 2-3 L of oxygen at home  . Cigarette smoker 12/02/2010   Followed in Pulmonary clinic/ [facility]/ [place]   - Limits of effective care reviewed 12/22/2011    . Complication of anesthesia   . Constipation   . COPD (chronic obstructive pulmonary disease) ([place]   . COPD with chronic bronchitis ([place] 10/01/2017  . Daily headache   . Depression   . Diabetic peripheral neuropathy ([place]   . DM (diabetes mellitus) type II controlled, neurological manifestation ([place] 12/02/2010  . Drug use   . Gallbladder problem   . Gastric erosions    EGD 08/2010.  .Marland KitchenGERD (gastroesophageal reflux disease)   . Glaucoma   . H/O drug abuse ([place] 11/12/2017   -- scanned document from outside  source: [facility] in [place] which showed UDS positive for Adderall /amphetamine usage as well as positive urine for THC.  This test result was collected 08/11/2016 and reported 10/07/2016. - lso review of the chart shows another positive amphetamine, THC and METH in the urine back on 10/18/2015 under "care everywhere". ---So   . Heavy menses   . High cholesterol   . History of blood transfusion    "related to low HgB" (10/05/2017)  . History of hiatal hernia   . HTN (hypertension)   . Hyperlipidemia associated with type 2 diabetes mellitus  ([place]) 10/18/2015  . Hypertension associated with diabetes ([place]) 12/02/2010   D/c acei 12/22/2011 due to psuedowheeze and narcotic dependent cough> ? Improved - 52/77/8242 started bystolic in place of cozar due to cough    . IBS (irritable bowel syndrome)   . Increased urinary protein excretion   . Internal hemorrhoids    Colonoscopy 5/12.  . Iron deficiency anemia 10/01/2017  . Joint pain   . Mixed diabetic hyperlipidemia associated with type 2 diabetes mellitus ([place]) 10/01/2017  . On home oxygen therapy    "5L at night" (10/05/2017)  . OSA on CPAP   . Osteoarthritis    "back" (10/05/2017)  . Oxygen dependent 10/16/2011  . Pneumonia    "lots of times" (10/05/2017)  . PONV (postoperative nausea and vomiting)   . Poorly controlled diabetes mellitus ([place]) 11/12/2017  . Pulmonary infiltrates 12/22/2011   Followed in Pulmonary clinic/ [facility]/ [place]    - See CT Chest 05/05/11   . Shortness of breath   . Tachycardia    never had test done since no insurance  . Tobacco use disorder-current smoker greater than 40-pack-year history- since age 29 1 ppd 10/01/2017  . Type II diabetes mellitus ([place])   . Vitamin D deficiency     Tobacco History: Social History   Tobacco Use  Smoking Status Current Every Day Smoker  . Packs/day: 1.50  . Years: 40.00  . Pack years: 60.00  . Types: Cigarettes  . Start date: 1980  Smokeless Tobacco Never Used   Ready to quit: Not Answered Counseling given: Not Answered   Outpatient Medications Prior to Visit  Medication Sig Dispense Refill  . ACCU-CHEK GUIDE test strip USE UP TO FOUR TIMES DAILY AS DIRECTED 200 strip 11  . Accu-Chek Softclix Lancets lancets USE UP TO FOUR TIMES DAILY AS DIRECTED 300 each 0  . albuterol (PROVENTIL) (2.5 MG/3ML) 0.083% nebulizer solution Take 3 mLs (2.5 mg total) by nebulization every 6 (six) hours as needed for wheezing or shortness of breath. 75 mL 12  . amphetamine-dextroamphetamine (ADDERALL) 30 MG tablet Take 1  tablet by mouth 2 (two) times daily. 60 tablet 0  . aspirin EC 81 MG tablet Take 1 tablet (81 mg total) by mouth daily. 90 tablet 3  . atorvastatin (LIPITOR) 40 MG tablet Take 1 tablet (40 mg total) by mouth daily. 30 tablet 2  . b complex vitamins tablet Take 1 tablet by mouth daily.    . blood glucose meter kit and supplies Dispense based on patient and insurance preference. Use up to four times daily as directed. (FOR ICD-10 E10.9, E11.9). 1 each 0  . ciprofloxacin-dexamethasone (CIPRODEX) OTIC suspension Place 4-5 drops into the right ear 2 (two) times daily as needed (drainage).    . cycloSPORINE (RESTASIS) 0.05 % ophthalmic emulsion Place 1 drop into both eyes 2 (two) times daily.    [name]  DULoxetine (CYMBALTA) 60 MG capsule TAKE 1 CAPSULE(60 MG) BY MOUTH AT BEDTIME (Patient taking differently: Take 60 mg by mouth at bedtime. ) 30 capsule 2  . fluticasone (FLONASE) 50 MCG/ACT nasal spray USE 1 SPRAY IN EACH NOSTRIL TWICE DAILY AFTER SINUS RINSES 16 g 2  . Insulin Pen Needle (BD PEN NEEDLE NANO U/F) 32G X 4 MM MISC USE AS DIRECTED WITH VICTOZA 100 each 11  . INVOKANA 300 MG TABS tablet TAKE 1 TABLET(300 MG) BY MOUTH DAILY BEFORE BREAKFAST 90 tablet 1  . isosorbide mononitrate (IMDUR) 30 MG 24 hr tablet TAKE 1 TABLET BY MOUTH EVERY DAY (Patient taking differently: Take 30 mg by mouth daily. ) 90 tablet 3  . Lancets Misc. (ACCU-CHEK SOFTCLIX LANCET DEV) KIT Check Blood sugar up to 4 times a day 1 kit 0  . liraglutide (VICTOZA) 18 MG/3ML SOPN INJECT 0.3 MLS (1.8 MG TOTAL) INTO THE SKIN DAILY. (Patient taking differently: Inject 1.8 mg into the skin daily. INJECT 0.3 MLS (1.8 MG TOTAL) INTO THE SKIN DAILY.) 27 mL 1  . losartan (COZAAR) 25 MG tablet Take 1 tablet (25 mg total) by mouth daily. 90 tablet 3  . pantoprazole (PROTONIX) 40 MG tablet Take 1 tablet (40 mg total) by mouth daily. 30 minutes before a meal. 90 tablet 3  . pregabalin (LYRICA) 150 MG capsule Take 1 capsule (150 mg total) by mouth 2  (two) times daily. No ReFills 180 capsule 1  . topiramate (TOPAMAX) 50 MG tablet Take 1 tablet (50 mg total) by mouth daily. (Patient taking differently: Take 50 mg by mouth daily after supper. ) 30 tablet 0  . Vitamin D, Ergocalciferol, (DRISDOL) 1.25 MG (50000 UNIT) CAPS capsule Take 1 capsule (50,000 Units total) by mouth every 7 (seven) days. 4 capsule 0   Facility-Administered Medications Prior to Visit  Medication Dose Route Frequency Provider Last Rate Last Admin  . ipratropium-albuterol (DUONEB) 0.5-2.5 (3) MG/3ML nebulizer solution 3 mL  3 mL Nebulization Q6H Opalski, Deborah, DO   3 mL at 01/10/18 1042     Review of Systems:   Constitutional:   No  weight loss, night sweats,  Fevers, chills, fatigue, or  lassitude.  HEENT:   No headaches,  Difficulty swallowing,  Tooth/dental problems, or  Sore throat,                No sneezing, itching, ear ache, nasal congestion, post nasal drip,   CV:  No chest pain,  Orthopnea, PND, swelling in lower extremities, anasarca, dizziness, palpitations, syncope.   GI  No heartburn, indigestion, abdominal pain, nausea, vomiting, diarrhea, change in bowel habits, loss of appetite, bloody stools.   Resp: No shortness of breath with exertion or at rest.  No excess mucus, no productive cough,  No non-productive cough,  No coughing up of blood.  No change in color of mucus.  No wheezing.  No chest wall deformity  Skin: no rash or lesions.  GU: no dysuria, change in color of urine, no urgency or frequency.  No flank pain, no hematuria   MS:  No joint pain or swelling.  No decreased range of motion.  No back pain.    Physical Exam  BP 122/60 (BP Location: Left Arm, Cuff Size: Large)   Pulse (!) 54   Ht 5' 4" (1.626 m)   Wt 264 lb (119.7 kg)   LMP 08/01/2014 Comment: irregular  SpO2 95%   BMI 45.32 kg/m   GEN: A/Ox3; pleasant , NAD, BMI  45   HEENT:  [place]/AT,   NOSE-clear, THROAT-clear, no lesions, no postnasal drip or exudate noted.    NECK:  Supple w/ fair ROM; no JVD; normal carotid impulses w/o bruits; no thyromegaly or nodules palpated; no lymphadenopathy.    RESP  Clear  P & A; w/o, wheezes/ rales/ or rhonchi. no accessory muscle use, no dullness to percussion  CARD:  RRR, no m/r/g, no peripheral edema, pulses intact, no cyanosis or clubbing.  GI:   Soft & nt; nml bowel sounds; no organomegaly or masses detected.   Musco: Warm bil, no deformities or joint swelling noted.   Neuro: alert, no focal deficits noted.    Skin: Warm, no lesions or rashes    Lab Results:  BMET  BNP No results found for: BNP  Imaging: No results found.    No flowsheet data found.  No results found for: NITRICOXIDE      Assessment & Plan:   No problem-specific Assessment & Plan notes found for this encounter.     Rexene Edison, NP 08/29/2019

## 2019-08-29 NOTE — Patient Instructions (Addendum)
Keep up the good work for with CPAP each night .  Work on healthy weight loss.  Do not drive if sleepy .  Follow up with Dr. Elsworth Soho  In 4-6 months and As needed  For sleep apnea .   Albuterol Inhaler As needed   Work on not smoking .  Follow up with Dr. Melvyn Novas  In 6-8 weeks with PFTs and chest xray

## 2019-08-31 NOTE — H&P (View-Only) (Signed)
Cardiology Office Note:    Date:  09/03/2019   ID:  [name], DOB 1965-06-03, MRN [id number]  PCP:  Ronnald Nian, DO  Cardiologist:  Sinclair Grooms, MD  Electrophysiologist:  None   Referring MD: Ronnald Nian, DO    Chief Complaint  Patient presents with  . Chest Pain   History of Present Illness:    [name] is a 54 y.o. female with a hx of COPD, OSA, type 2 diabetes, obesity, hyperlipidemia, hypertension, anxiety and depression who is referred by Dr. Lucia Gaskins for preop evaluation prior to tympanoplasty.  Has had repeated ear infections, and tympanoplasty planned.  She had a work-up for chest pain in 2019.  Underwent coronary CTA on 12/04/2017 which showed calcium score 411 (99th percentile) mild to moderate diffuse CAD.  Technically difficult study given morbid obesity and high heart rates despite metoprolol, Cardizem, and Xanax.  CT FFR was sent which showed focal severe stenosis (CT FFR 0.78) in the distal RCA prior to the bifurcation of the PDA and PLA.  TTE on 10/06/2017 showed LVEF 55 to 25%, grade 1 diastolic dysfunction, no significant valvular disease, normal RV function.  Patient reports that she did not follow-up with cardiology following her coronary CTA.  States that she has continued to have exertional chest pain.  Reports that exertion such as walking up a flight of stairs will cause her to have left-sided, sharp pain that resolves with rest.  Reports that she has been having lethargy and gastrointestinal issues on atorvastatin 80 mg daily, was decreased to 40 mg daily and symptoms resolved.   Family history includes sister recently died of MI at age 82.  Mother died of CHF.  Patient reports that she continues to smoke 1.5 packs/day.  Has smoked for 40 years.  Also smokes marijuana.     Past Medical History:  Diagnosis Date  . ADHD   . Alcohol abuse   . Anemia   . Anxiety   . Asthmatic bronchitis    normal PFT/ seen by  pulmonary no evidence of COPD  . Atypical chest pain 10/05/2017  . Back pain   . Bilateral swelling of feet   . Chest pain   . Chest pain on respiration 03/25/2014  . Chronic bronchitis ([place])   . Chronic lower back pain   . Chronic respiratory failure ([place]) 10/16/2011   Newly 02 dep 24/7 p discharge from [facility] 01/2013  - 03/13/2014  Walked RA  2 laps @ 185 ft each stopped due to  Sob/ aching in legs, thirsty/ no desat @ slow pace   . Chronic respiratory failure with hypoxia (HCC)    On 2-3 L of oxygen at home  . Cigarette smoker 12/02/2010   Followed in Pulmonary clinic/ [facility]/ [place]   - Limits of effective care reviewed 12/22/2011    . Complication of anesthesia   . Constipation   . COPD (chronic obstructive pulmonary disease) ([place])   . COPD with chronic bronchitis ([place]) 10/01/2017  . Daily headache   . Depression   . Diabetic peripheral neuropathy ([place])   . DM (diabetes mellitus) type II controlled, neurological manifestation ([place]) 12/02/2010  . Drug use   . Gallbladder problem   . Gastric erosions    EGD 08/2010.  [name] GERD (gastroesophageal reflux disease)   . Glaucoma   . H/O drug abuse ([place]) 11/12/2017   -- scanned document from outside source: [facility] in [place] which showed UDS positive for Adderall /amphetamine usage as well as positive urine for THC.  This test result was collected 08/11/2016 and reported 10/07/2016. - lso review of the chart shows another positive amphetamine, THC and METH in the urine back on 10/18/2015 under "care everywhere". ---So   . Heavy menses   . High cholesterol   . History of blood transfusion    "related to low HgB" (10/05/2017)  . History of hiatal hernia   . HTN (hypertension)   . Hyperlipidemia associated with type 2 diabetes mellitus ([place]) 10/18/2015  . Hypertension associated with diabetes ([place]) 12/02/2010   D/c acei 12/22/2011 due to psuedowheeze and narcotic dependent cough> ? Improved -  72/62/0355 started bystolic in place of cozar due to cough    . IBS (irritable bowel syndrome)   . Increased urinary protein excretion   . Internal hemorrhoids    Colonoscopy 5/12.  . Iron deficiency anemia 10/01/2017  . Joint pain   . Mixed diabetic hyperlipidemia associated with type 2 diabetes mellitus ([place]) 10/01/2017  . On home oxygen therapy    "5L at night" (10/05/2017)  . OSA on CPAP   . Osteoarthritis    "back" (10/05/2017)  . Oxygen dependent 10/16/2011  . Pneumonia    "lots of times" (10/05/2017)  . PONV (postoperative nausea and vomiting)   . Poorly controlled diabetes mellitus ([place]) 11/12/2017  . Pulmonary infiltrates 12/22/2011   Followed in Pulmonary clinic/ [facility]/ [place]    - See CT Chest 05/05/11   . Shortness of breath   . Tachycardia    never had test done since no insurance  . Tobacco use disorder-current smoker greater than 40-pack-year history- since age 45 1 ppd 10/01/2017  . Type II diabetes mellitus ([place])   . Vitamin D deficiency     Past Surgical History:  Procedure Laterality Date  . CESAREAN SECTION  1988; 1989  . CHOLECYSTECTOMY OPEN  1990  . COLONOSCOPY  09/16/2010   [facility]:BULAGT colon/small internal hemorrhoids  . ESOPHAGOGASTRODUODENOSCOPY  09/16/2010   SLF: normal/mild gastritis  . ESOPHAGOGASTRODUODENOSCOPY N/A 10/19/2014   Procedure: ESOPHAGOGASTRODUODENOSCOPY (EGD);  Surgeon: Danie Binder, MD;  Location: [facility];  Service: Endoscopy;  Laterality: N/A;  830  . FRACTURE SURGERY    . HYSTEROSCOPY WITH THERMACHOICE  01/17/2012   Procedure: HYSTEROSCOPY WITH THERMACHOICE;  Surgeon: Florian Buff, MD;  Location: [facility] ORS;  Service: Gynecology;  Laterality: N/A;  total therapy time: 9:13sec  D5W  18 ml in, D5W   36m out, temperture 87degrees celcious  . KIDNEY SURGERY     as child for blockages  . TONSILLECTOMY    . TUBAL LIGATION  1989  . TYMPANOSTOMY TUBE PLACEMENT Bilateral    "several times when I was a child"  . uterine ablation    .  WRIST FRACTURE SURGERY Left 1995    Current Medications: Current Meds  Medication Sig  . ACCU-CHEK GUIDE test strip USE UP TO FOUR TIMES DAILY AS DIRECTED  . Accu-Chek Softclix Lancets lancets USE UP TO FOUR TIMES DAILY AS DIRECTED  . albuterol (PROVENTIL) (2.5 MG/3ML) 0.083% nebulizer solution Take 3 mLs (2.5 mg total) by nebulization every 6 (six) hours as needed for wheezing or shortness of breath.  . amphetamine-dextroamphetamine (ADDERALL) 30 MG tablet Take 1 tablet by mouth 2 (two) times daily.  .Marland Kitchenaspirin EC 81 MG tablet Take 1 tablet (81 mg total) by mouth daily.  .Marland Kitchenatorvastatin (LIPITOR) 40 MG tablet Take 1 tablet (40  mg total) by mouth daily.  . blood glucose meter kit and supplies Dispense based on patient and insurance preference. Use up to four times daily as directed. (FOR ICD-10 E10.9, E11.9).  . ciprofloxacin-dexamethasone (CIPRODEX) OTIC suspension Place 4-5 drops into the right ear 2 (two) times daily as needed (drainage).  . cycloSPORINE (RESTASIS) 0.05 % ophthalmic emulsion Place 1 drop into both eyes 2 (two) times daily.  . DULoxetine (CYMBALTA) 60 MG capsule TAKE 1 CAPSULE(60 MG) BY MOUTH AT BEDTIME  . fluticasone (FLONASE) 50 MCG/ACT nasal spray USE 1 SPRAY IN EACH NOSTRIL TWICE DAILY AFTER SINUS RINSES  . Insulin Pen Needle (BD PEN NEEDLE NANO U/F) 32G X 4 MM MISC USE AS DIRECTED WITH VICTOZA  . INVOKANA 300 MG TABS tablet TAKE 1 TABLET(300 MG) BY MOUTH DAILY BEFORE BREAKFAST  . isosorbide mononitrate (IMDUR) 30 MG 24 hr tablet TAKE 1 TABLET BY MOUTH EVERY DAY (Patient taking differently: Take 30 mg by mouth daily. )  . Lancets Misc. (ACCU-CHEK SOFTCLIX LANCET DEV) KIT Check Blood sugar up to 4 times a day  . liraglutide (VICTOZA) 18 MG/3ML SOPN INJECT 0.3 MLS (1.8 MG TOTAL) INTO THE SKIN DAILY. (Patient taking differently: Inject 1.8 mg into the skin daily. INJECT 0.3 MLS (1.8 MG TOTAL) INTO THE SKIN DAILY.)  . losartan (COZAAR) 25 MG tablet Take 1 tablet (25 mg total)  by mouth daily.  . pantoprazole (PROTONIX) 40 MG tablet Take 1 tablet (40 mg total) by mouth daily. 30 minutes before a meal.  . pregabalin (LYRICA) 150 MG capsule Take 1 capsule (150 mg total) by mouth 2 (two) times daily. No ReFills  . Vitamin D, Ergocalciferol, (DRISDOL) 1.25 MG (50000 UNIT) CAPS capsule Take 1 capsule (50,000 Units total) by mouth every 7 (seven) days.  . [DISCONTINUED] amphetamine-dextroamphetamine (ADDERALL) 30 MG tablet Take 1 tablet by mouth 2 (two) times daily. Fill after 11/01/2019   Current Facility-Administered Medications for the 09/03/19 encounter (Office Visit) with Donato Heinz, MD  Medication  . ipratropium-albuterol (DUONEB) 0.5-2.5 (3) MG/3ML nebulizer solution 3 mL     Allergies:   Codeine, Metformin and related, Wellbutrin [bupropion hcl], and Acyclovir and related   Social History   Socioeconomic History  . Marital status: Married    Spouse name: Not on file  . Number of children: 2  . Years of education: Not on file  . Highest education level: Not on file  Occupational History  . Occupation: unemployed  Tobacco Use  . Smoking status: Current Every Day Smoker    Packs/day: 1.50    Years: 40.00    Pack years: 60.00    Types: Cigarettes    Start date: 33  . Smokeless tobacco: Never Used  Substance and Sexual Activity  . Alcohol use: Yes    Comment: once a month - 3-4 mixed drinks  . Drug use: Yes    Types: Marijuana    Comment: daily use  . Sexual activity: Yes    Partners: Male    Birth control/protection: Surgical  Other Topics Concern  . Not on file  Social History Narrative  . Not on file   Social Determinants of Health   Financial Resource Strain:   . Difficulty of Paying Living Expenses:   Food Insecurity:   . Worried About Charity fundraiser in the Last Year:   . Arboriculturist in the Last Year:   Transportation Needs:   . Film/video editor (Medical):   [name] Lack of  Transportation (Non-Medical):    Physical Activity:   . Days of Exercise per Week:   . Minutes of Exercise per Session:   Stress:   . Feeling of Stress :   Social Connections:   . Frequency of Communication with Friends and Family:   . Frequency of Social Gatherings with Friends and Family:   . Attends Religious Services:   . Active Member of Clubs or Organizations:   . Attends Archivist Meetings:   [name] Marital Status:      Family History: The patient's family history includes Alcohol abuse in her father; Anxiety disorder in her sister; Breast cancer in her mother; Cancer in her mother; Depression in her father, mother, and sister; Diabetes in her mother; Heart attack in her maternal grandmother; Heart attack (age of onset: 24) in her father; Heart attack (age of onset: 81) in her mother; Heart disease in her father and mother; Heart failure in her father, mother, and sister; Hyperlipidemia in her mother; Hypertension in her mother and sister; Liver disease (age of onset: 68) in her maternal aunt; Obesity in her mother; Sleep apnea in her mother; Sudden death in her father and mother; Ulcers in her sister. There is no history of Colon cancer.  ROS:   Please see the history of present illness.    All other systems reviewed and are negative.  EKGs/Labs/Other Studies Reviewed:    The following studies were reviewed today:   EKG:  EKG is ordered today.  The ekg ordered today demonstrates normal sinus rhythm, rate 100, no ST/T abnormality  Recent Labs: 03/07/2019: Magnesium 1.7 06/30/2019: TSH 1.860 08/15/2019: ALT 16; BUN 9; Creatinine, Ser 0.56; Hemoglobin 14.4; Platelets 309; Potassium 3.9; Sodium 138  Recent Lipid Panel    Component Value Date/Time   CHOL 151 06/30/2019 1345   TRIG 98 06/30/2019 1345   HDL 48 06/30/2019 1345   CHOLHDL 3.4 02/12/2018 0830   CHOLHDL 7.7 10/06/2017 0318   VLDL 35 10/06/2017 0318   LDLCALC 85 06/30/2019 1345   LDLDIRECT 121 (H) 10/03/2013 1655    Physical Exam:     VS:  BP 128/78   Pulse 100   Ht 5' 4" (1.626 m)   Wt 266 lb 12.8 oz (121 kg)   LMP 08/01/2014 Comment: irregular  SpO2 96%   BMI 45.80 kg/m     Wt Readings from Last 3 Encounters:  09/03/19 266 lb 12.8 oz (121 kg)  08/29/19 264 lb (119.7 kg)  08/15/19 264 lb 8 oz (120 kg)     GEN: in no acute distress HEENT: Normal NECK: No JVD; No carotid bruits LYMPHATICS: No lymphadenopathy CARDIAC: RRR, no murmurs, rubs, gallops RESPIRATORY:  Clear to auscultation without rales, wheezing or rhonchi  ABDOMEN: Soft, non-tender, non-distended MUSCULOSKELETAL:  No edema SKIN: Warm and dry NEUROLOGIC:  Alert and oriented x 3 PSYCHIATRIC:  Normal affect   ASSESSMENT:    1. Chest pain of uncertain etiology   2. Coronary artery disease of native artery of native heart with stable angina pectoris ([place])   3. Abnormal cardiac CT angiography   4. Pre-procedure lab exam   5. Pre-op evaluation   6. Essential hypertension   7. Hyperlipidemia, unspecified hyperlipidemia type   8. Tobacco use    PLAN:     CAD: Coronary CTA 2019 showed calcium score 411 (90th percentile) with diffuse disease.  CT FFR showed focal severe stenosis in the distal RCA prior to bifurcation of PDA and PLA (CT FFR 0.78).  She did not follow-up after her CTA.  She is having typical angina. -Continue aspirin 81 mg daily -Continue atorvastatin 40 mg daily.  LDL above goal less than 70 but patient did not tolerate atorvastatin 80 mg daily.  Will add Zetia 10 mg daily -Continue Imdur 30 mg daily -Start metoprolol 25 mg twice daily -As needed sublingual nitroglycerin -Recommend heart catheterization.  Risks and benefits of cardiac catheterization have been discussed with the patient.  These include bleeding, infection, kidney damage, stroke, heart attack, death.  The patient understands these risks and is willing to proceed.  Preop evaluation: Prior to tympanoplasty.  Patient is reporting exertional chest pain and had  obstructive CAD on coronary CTA in 2019.  Recommend further evaluation with heart catheterization prior to procedure as above  Hypertension: On losartan 25 mg daily and Imdur 30 mg daily.  Adding metoprolol for antianginal effect as above  Hyperlipidemia: On atorvastatin 40 mg daily.  LDL 85 06/30/19, above goal less than 70.  Did not tolerate atorvastatin 80 mg daily.  Will add Zetia 10 mg daily  Type 2 diabetes: A1c 7.6 06/30/19  Tobacco use: Patient counseled on the risk of tobacco use and cessation strongly encouraged.  RTC in 2 weeks  Medication Adjustments/Labs and Tests Ordered: Current medicines are reviewed at length with the patient today.  Concerns regarding medicines are outlined above.  Orders Placed This Encounter  Procedures  . Basic metabolic panel  . CBC  . EKG 12-Lead   Meds ordered this encounter  Medications  . metoprolol tartrate (LOPRESSOR) 25 MG tablet    Sig: Take 1 tablet (25 mg total) by mouth 2 (two) times daily.    Dispense:  180 tablet    Refill:  3  . ezetimibe (ZETIA) 10 MG tablet    Sig: Take 1 tablet (10 mg total) by mouth daily.    Dispense:  90 tablet    Refill:  3  . nitroGLYCERIN (NITROSTAT) 0.4 MG SL tablet    Sig: Place 1 tablet (0.4 mg total) under the tongue every 5 (five) minutes as needed for chest pain.    Dispense:  25 tablet    Refill:  3    Patient Instructions  START metoprolol tartrate (Lopressor) 25 mg two times daily START Zetia 10 mg daily Take sublingual nitroglycerin AS NEEDED for chest pain    [address] [address] [place] [address] Dept: [phone] Loc: [place]  09/03/2019  You are scheduled for a Cardiac Catheterization on Monday, May 17 with Dr. Peter Martinique.  1. Please arrive at the [facility] (Main Entrance A) at [facility]: [address] [place], [place] [address] at 7:00  AM (This time is two hours before your procedure to ensure your preparation). Free valet parking service is available.   Special note: Every effort is made to have your procedure done on time. Please understand that emergencies sometimes delay scheduled procedures.  2. Diet: Do not eat solid foods after midnight.  The patient may have clear liquids until 5am upon the day of the procedure.  3. Labs: Today in office  COVID TEST: Thursday 5/13 at 12:30 pm [place]  4. Medication instructions in preparation for your procedure:   Contrast Allergy: No  Hold Losartan AM of procedure  Hold Invokana AM of procedure  Hold Victoza AM of procedure  On the morning of your procedure, take  your Aspirin and any morning medicines NOT listed above.  You may use sips of water.  5. Plan for one night stay--bring personal belongings. 6. Bring a current list of your medications and current insurance cards. 7. You MUST have a responsible person to drive you home. 8. Someone MUST be with you the first 24 hours after you arrive home or your discharge will be delayed. 9. Please wear clothes that are easy to get on and off and wear slip-on shoes.  Thank you for allowing [place] to care for you!   -- [facility]   Follow up:  2 weeks after cath with Dr. Gardiner Rhyme     Signed, Donato Heinz, MD  09/03/2019 7:04 PM    [place]

## 2019-08-31 NOTE — Progress Notes (Signed)
Cardiology Office Note:    Date:  09/03/2019   ID:  BERGEN MELLE, DOB 1966-02-25, MRN [id number]  PCP:  Ronnald Nian, DO  Cardiologist:  Sinclair Grooms, MD  Electrophysiologist:  None   Referring MD: Ronnald Nian, DO    Chief Complaint  Patient presents with  . Chest Pain   History of Present Illness:    [name] is a 54 y.o. female with a hx of COPD, OSA, type 2 diabetes, obesity, hyperlipidemia, hypertension, anxiety and depression who is referred by Dr. Lucia Gaskins for preop evaluation prior to tympanoplasty.  Has had repeated ear infections, and tympanoplasty planned.  She had a work-up for chest pain in 2019.  Underwent coronary CTA on 12/04/2017 which showed calcium score 411 (99th percentile) mild to moderate diffuse CAD.  Technically difficult study given morbid obesity and high heart rates despite metoprolol, Cardizem, and Xanax.  CT FFR was sent which showed focal severe stenosis (CT FFR 0.78) in the distal RCA prior to the bifurcation of the PDA and PLA.  TTE on 10/06/2017 showed LVEF 55 to 25%, grade 1 diastolic dysfunction, no significant valvular disease, normal RV function.  Patient reports that she did not follow-up with cardiology following her coronary CTA.  States that she has continued to have exertional chest pain.  Reports that exertion such as walking up a flight of stairs will cause her to have left-sided, sharp pain that resolves with rest.  Reports that she has been having lethargy and gastrointestinal issues on atorvastatin 80 mg daily, was decreased to 40 mg daily and symptoms resolved.   Family history includes sister recently died of MI at age 14.  Mother died of CHF.  Patient reports that she continues to smoke 1.5 packs/day.  Has smoked for 40 years.  Also smokes marijuana.     Past Medical History:  Diagnosis Date  . ADHD   . Alcohol abuse   . Anemia   . Anxiety   . Asthmatic bronchitis    normal PFT/ seen by  pulmonary no evidence of COPD  . Atypical chest pain 10/05/2017  . Back pain   . Bilateral swelling of feet   . Chest pain   . Chest pain on respiration 03/25/2014  . Chronic bronchitis ([place])   . Chronic lower back pain   . Chronic respiratory failure ([place]) 10/16/2011   Newly 02 dep 24/7 p discharge from [facility] 01/2013  - 03/13/2014  Walked RA  2 laps @ 185 ft each stopped due to  Sob/ aching in legs, thirsty/ no desat @ slow pace   . Chronic respiratory failure with hypoxia (HCC)    On 2-3 L of oxygen at home  . Cigarette smoker 12/02/2010   Followed in Pulmonary clinic/ [facility]/ [place]   - Limits of effective care reviewed 12/22/2011    . Complication of anesthesia   . Constipation   . COPD (chronic obstructive pulmonary disease) ([place])   . COPD with chronic bronchitis ([place]) 10/01/2017  . Daily headache   . Depression   . Diabetic peripheral neuropathy ([place])   . DM (diabetes mellitus) type II controlled, neurological manifestation ([place]) 12/02/2010  . Drug use   . Gallbladder problem   . Gastric erosions    EGD 08/2010.  [name] GERD (gastroesophageal reflux disease)   . Glaucoma   . H/O drug abuse ([place]) 11/12/2017   -- scanned document from outside source: [facility] in [place]  [place] which showed UDS positive for Adderall /amphetamine usage as well as positive urine for THC.  This test result was collected 08/11/2016 and reported 10/07/2016. - lso review of the chart shows another positive amphetamine, THC and METH in the urine back on 10/18/2015 under "care everywhere". ---So   . Heavy menses   . High cholesterol   . History of blood transfusion    "related to low HgB" (10/05/2017)  . History of hiatal hernia   . HTN (hypertension)   . Hyperlipidemia associated with type 2 diabetes mellitus ([place]) 10/18/2015  . Hypertension associated with diabetes ([place]) 12/02/2010   D/c acei 12/22/2011 due to psuedowheeze and narcotic dependent cough> ? Improved -  72/62/0355 started bystolic in place of cozar due to cough    . IBS (irritable bowel syndrome)   . Increased urinary protein excretion   . Internal hemorrhoids    Colonoscopy 5/12.  . Iron deficiency anemia 10/01/2017  . Joint pain   . Mixed diabetic hyperlipidemia associated with type 2 diabetes mellitus ([place]) 10/01/2017  . On home oxygen therapy    "5L at night" (10/05/2017)  . OSA on CPAP   . Osteoarthritis    "back" (10/05/2017)  . Oxygen dependent 10/16/2011  . Pneumonia    "lots of times" (10/05/2017)  . PONV (postoperative nausea and vomiting)   . Poorly controlled diabetes mellitus ([place]) 11/12/2017  . Pulmonary infiltrates 12/22/2011   Followed in Pulmonary clinic/ [facility]/ [place]    - See CT Chest 05/05/11   . Shortness of breath   . Tachycardia    never had test done since no insurance  . Tobacco use disorder-current smoker greater than 40-pack-year history- since age 73 1 ppd 10/01/2017  . Type II diabetes mellitus ([place])   . Vitamin D deficiency     Past Surgical History:  Procedure Laterality Date  . CESAREAN SECTION  1988; 1989  . CHOLECYSTECTOMY OPEN  1990  . COLONOSCOPY  09/16/2010   HRC:BULAGT colon/small internal hemorrhoids  . ESOPHAGOGASTRODUODENOSCOPY  09/16/2010   SLF: normal/mild gastritis  . ESOPHAGOGASTRODUODENOSCOPY N/A 10/19/2014   Procedure: ESOPHAGOGASTRODUODENOSCOPY (EGD);  Surgeon: Danie Binder, MD;  Location: [facility] SUITE;  Service: Endoscopy;  Laterality: N/A;  830  . FRACTURE SURGERY    . HYSTEROSCOPY WITH THERMACHOICE  01/17/2012   Procedure: HYSTEROSCOPY WITH THERMACHOICE;  Surgeon: Florian Buff, MD;  Location: [facility] ORS;  Service: Gynecology;  Laterality: N/A;  total therapy time: 9:13sec  D5W  18 ml in, D5W   49m out, temperture 87degrees celcious  . KIDNEY SURGERY     as child for blockages  . TONSILLECTOMY    . TUBAL LIGATION  1989  . TYMPANOSTOMY TUBE PLACEMENT Bilateral    "several times when I was a child"  . uterine ablation    .  WRIST FRACTURE SURGERY Left 1995    Current Medications: Current Meds  Medication Sig  . ACCU-CHEK GUIDE test strip USE UP TO FOUR TIMES DAILY AS DIRECTED  . Accu-Chek Softclix Lancets lancets USE UP TO FOUR TIMES DAILY AS DIRECTED  . albuterol (PROVENTIL) (2.5 MG/3ML) 0.083% nebulizer solution Take 3 mLs (2.5 mg total) by nebulization every 6 (six) hours as needed for wheezing or shortness of breath.  . amphetamine-dextroamphetamine (ADDERALL) 30 MG tablet Take 1 tablet by mouth 2 (two) times daily.  .Marland Kitchenaspirin EC 81 MG tablet Take 1 tablet (81 mg total) by mouth daily.  .Marland Kitchenatorvastatin (LIPITOR) 40 MG tablet Take 1 tablet (40  mg total) by mouth daily.  . blood glucose meter kit and supplies Dispense based on patient and insurance preference. Use up to four times daily as directed. (FOR ICD-10 E10.9, E11.9).  . ciprofloxacin-dexamethasone (CIPRODEX) OTIC suspension Place 4-5 drops into the right ear 2 (two) times daily as needed (drainage).  . cycloSPORINE (RESTASIS) 0.05 % ophthalmic emulsion Place 1 drop into both eyes 2 (two) times daily.  . DULoxetine (CYMBALTA) 60 MG capsule TAKE 1 CAPSULE(60 MG) BY MOUTH AT BEDTIME  . fluticasone (FLONASE) 50 MCG/ACT nasal spray USE 1 SPRAY IN EACH NOSTRIL TWICE DAILY AFTER SINUS RINSES  . Insulin Pen Needle (BD PEN NEEDLE NANO U/F) 32G X 4 MM MISC USE AS DIRECTED WITH VICTOZA  . INVOKANA 300 MG TABS tablet TAKE 1 TABLET(300 MG) BY MOUTH DAILY BEFORE BREAKFAST  . isosorbide mononitrate (IMDUR) 30 MG 24 hr tablet TAKE 1 TABLET BY MOUTH EVERY DAY (Patient taking differently: Take 30 mg by mouth daily. )  . Lancets Misc. (ACCU-CHEK SOFTCLIX LANCET DEV) KIT Check Blood sugar up to 4 times a day  . liraglutide (VICTOZA) 18 MG/3ML SOPN INJECT 0.3 MLS (1.8 MG TOTAL) INTO THE SKIN DAILY. (Patient taking differently: Inject 1.8 mg into the skin daily. INJECT 0.3 MLS (1.8 MG TOTAL) INTO THE SKIN DAILY.)  . losartan (COZAAR) 25 MG tablet Take 1 tablet (25 mg total)  by mouth daily.  . pantoprazole (PROTONIX) 40 MG tablet Take 1 tablet (40 mg total) by mouth daily. 30 minutes before a meal.  . pregabalin (LYRICA) 150 MG capsule Take 1 capsule (150 mg total) by mouth 2 (two) times daily. No ReFills  . Vitamin D, Ergocalciferol, (DRISDOL) 1.25 MG (50000 UNIT) CAPS capsule Take 1 capsule (50,000 Units total) by mouth every 7 (seven) days.  . [DISCONTINUED] amphetamine-dextroamphetamine (ADDERALL) 30 MG tablet Take 1 tablet by mouth 2 (two) times daily. Fill after 11/01/2019   Current Facility-Administered Medications for the 09/03/19 encounter (Office Visit) with Donato Heinz, MD  Medication  . ipratropium-albuterol (DUONEB) 0.5-2.5 (3) MG/3ML nebulizer solution 3 mL     Allergies:   Codeine, Metformin and related, Wellbutrin [bupropion hcl], and Acyclovir and related   Social History   Socioeconomic History  . Marital status: Married    Spouse name: Not on file  . Number of children: 2  . Years of education: Not on file  . Highest education level: Not on file  Occupational History  . Occupation: unemployed  Tobacco Use  . Smoking status: Current Every Day Smoker    Packs/day: 1.50    Years: 40.00    Pack years: 60.00    Types: Cigarettes    Start date: 34  . Smokeless tobacco: Never Used  Substance and Sexual Activity  . Alcohol use: Yes    Comment: once a month - 3-4 mixed drinks  . Drug use: Yes    Types: Marijuana    Comment: daily use  . Sexual activity: Yes    Partners: Male    Birth control/protection: Surgical  Other Topics Concern  . Not on file  Social History Narrative  . Not on file   Social Determinants of Health   Financial Resource Strain:   . Difficulty of Paying Living Expenses:   Food Insecurity:   . Worried About Charity fundraiser in the Last Year:   . Arboriculturist in the Last Year:   Transportation Needs:   . Film/video editor (Medical):   [name] Lack of  Transportation (Non-Medical):    Physical Activity:   . Days of Exercise per Week:   . Minutes of Exercise per Session:   Stress:   . Feeling of Stress :   Social Connections:   . Frequency of Communication with Friends and Family:   . Frequency of Social Gatherings with Friends and Family:   . Attends Religious Services:   . Active Member of Clubs or Organizations:   . Attends Club or Organization Meetings:   . Marital Status:      Family History: The patient's family history includes Alcohol abuse in her father; Anxiety disorder in her sister; Breast cancer in her mother; Cancer in her mother; Depression in her father, mother, and sister; Diabetes in her mother; Heart attack in her maternal grandmother; Heart attack (age of onset: 49) in her father; Heart attack (age of onset: 55) in her mother; Heart disease in her father and mother; Heart failure in her father, mother, and sister; Hyperlipidemia in her mother; Hypertension in her mother and sister; Liver disease (age of onset: 40) in her maternal aunt; Obesity in her mother; Sleep apnea in her mother; Sudden death in her father and mother; Ulcers in her sister. There is no history of Colon cancer.  ROS:   Please see the history of present illness.    All other systems reviewed and are negative.  EKGs/Labs/Other Studies Reviewed:    The following studies were reviewed today:   EKG:  EKG is ordered today.  The ekg ordered today demonstrates normal sinus rhythm, rate 100, no ST/T abnormality  Recent Labs: 03/07/2019: Magnesium 1.7 06/30/2019: TSH 1.860 08/15/2019: ALT 16; BUN 9; Creatinine, Ser 0.56; Hemoglobin 14.4; Platelets 309; Potassium 3.9; Sodium 138  Recent Lipid Panel    Component Value Date/Time   CHOL 151 06/30/2019 1345   TRIG 98 06/30/2019 1345   HDL 48 06/30/2019 1345   CHOLHDL 3.4 02/12/2018 0830   CHOLHDL 7.7 10/06/2017 0318   VLDL 35 10/06/2017 0318   LDLCALC 85 06/30/2019 1345   LDLDIRECT 121 (H) 10/03/2013 1655    Physical Exam:     VS:  BP 128/78   Pulse 100   Ht 5' 4" (1.626 m)   Wt 266 lb 12.8 oz (121 kg)   LMP 08/01/2014 Comment: irregular  SpO2 96%   BMI 45.80 kg/m     Wt Readings from Last 3 Encounters:  09/03/19 266 lb 12.8 oz (121 kg)  08/29/19 264 lb (119.7 kg)  08/15/19 264 lb 8 oz (120 kg)     GEN: in no acute distress HEENT: Normal NECK: No JVD; No carotid bruits LYMPHATICS: No lymphadenopathy CARDIAC: RRR, no murmurs, rubs, gallops RESPIRATORY:  Clear to auscultation without rales, wheezing or rhonchi  ABDOMEN: Soft, non-tender, non-distended MUSCULOSKELETAL:  No edema SKIN: Warm and dry NEUROLOGIC:  Alert and oriented x 3 PSYCHIATRIC:  Normal affect   ASSESSMENT:    1. Chest pain of uncertain etiology   2. Coronary artery disease of native artery of native heart with stable angina pectoris (HCC)   3. Abnormal cardiac CT angiography   4. Pre-procedure lab exam   5. Pre-op evaluation   6. Essential hypertension   7. Hyperlipidemia, unspecified hyperlipidemia type   8. Tobacco use    PLAN:     CAD: Coronary CTA 2019 showed calcium score 411 (90th percentile) with diffuse disease.  CT FFR showed focal severe stenosis in the distal RCA prior to bifurcation of PDA and PLA (CT FFR 0.78).    She did not follow-up after her CTA.  She is having typical angina. -Continue aspirin 81 mg daily -Continue atorvastatin 40 mg daily.  LDL above goal less than 70 but patient did not tolerate atorvastatin 80 mg daily.  Will add Zetia 10 mg daily -Continue Imdur 30 mg daily -Start metoprolol 25 mg twice daily -As needed sublingual nitroglycerin -Recommend heart catheterization.  Risks and benefits of cardiac catheterization have been discussed with the patient.  These include bleeding, infection, kidney damage, stroke, heart attack, death.  The patient understands these risks and is willing to proceed.  Preop evaluation: Prior to tympanoplasty.  Patient is reporting exertional chest pain and had  obstructive CAD on coronary CTA in 2019.  Recommend further evaluation with heart catheterization prior to procedure as above  Hypertension: On losartan 25 mg daily and Imdur 30 mg daily.  Adding metoprolol for antianginal effect as above  Hyperlipidemia: On atorvastatin 40 mg daily.  LDL 85 06/30/19, above goal less than 70.  Did not tolerate atorvastatin 80 mg daily.  Will add Zetia 10 mg daily  Type 2 diabetes: A1c 7.6 06/30/19  Tobacco use: Patient counseled on the risk of tobacco use and cessation strongly encouraged.  RTC in 2 weeks  Medication Adjustments/Labs and Tests Ordered: Current medicines are reviewed at length with the patient today.  Concerns regarding medicines are outlined above.  Orders Placed This Encounter  Procedures  . Basic metabolic panel  . CBC  . EKG 12-Lead   Meds ordered this encounter  Medications  . metoprolol tartrate (LOPRESSOR) 25 MG tablet    Sig: Take 1 tablet (25 mg total) by mouth 2 (two) times daily.    Dispense:  180 tablet    Refill:  3  . ezetimibe (ZETIA) 10 MG tablet    Sig: Take 1 tablet (10 mg total) by mouth daily.    Dispense:  90 tablet    Refill:  3  . nitroGLYCERIN (NITROSTAT) 0.4 MG SL tablet    Sig: Place 1 tablet (0.4 mg total) under the tongue every 5 (five) minutes as needed for chest pain.    Dispense:  25 tablet    Refill:  3    Patient Instructions  START metoprolol tartrate (Lopressor) 25 mg two times daily START Zetia 10 mg daily Take sublingual nitroglycerin AS NEEDED for chest pain    [address] [place] [address] Dept: [phone] Loc: [place]  09/03/2019  You are scheduled for a Cardiac Catheterization on Monday, May 17 with Dr. Peter Martinique.  1. Please arrive at the [facility] (Main Entrance A) at [facility]: [address] [place], [place] [address] at 7:00  AM (This time is two hours before your procedure to ensure your preparation). Free valet parking service is available.   Special note: Every effort is made to have your procedure done on time. Please understand that emergencies sometimes delay scheduled procedures.  2. Diet: Do not eat solid foods after midnight.  The patient may have clear liquids until 5am upon the day of the procedure.  3. Labs: Today in office  COVID TEST: Thursday 5/13 at 12:30 pm [place]  4. Medication instructions in preparation for your procedure:   Contrast Allergy: No  Hold Losartan AM of procedure  Hold Invokana AM of procedure  Hold Victoza AM of procedure  On the morning of your procedure, take  your Aspirin and any morning medicines NOT listed above.  You may use sips of water.  5. Plan for one night stay--bring personal belongings. 6. Bring a current list of your medications and current insurance cards. 7. You MUST have a responsible person to drive you home. 8. Someone MUST be with you the first 24 hours after you arrive home or your discharge will be delayed. 9. Please wear clothes that are easy to get on and off and wear slip-on shoes.  Thank you for allowing [place] to care for you!   -- [facility]   Follow up:  2 weeks after cath with Dr. Gardiner Rhyme     Signed, Donato Heinz, MD  09/03/2019 7:04 PM    [facility]

## 2019-09-02 ENCOUNTER — Encounter (HOSPITAL_COMMUNITY): Payer: Self-pay | Admitting: Psychiatry

## 2019-09-02 ENCOUNTER — Ambulatory Visit: Payer: Medicare HMO | Admitting: Cardiology

## 2019-09-02 ENCOUNTER — Telehealth (INDEPENDENT_AMBULATORY_CARE_PROVIDER_SITE_OTHER): Payer: Medicare HMO | Admitting: Psychiatry

## 2019-09-02 ENCOUNTER — Other Ambulatory Visit: Payer: Self-pay

## 2019-09-02 DIAGNOSIS — F331 Major depressive disorder, recurrent, moderate: Secondary | ICD-10-CM | POA: Diagnosis not present

## 2019-09-02 DIAGNOSIS — F901 Attention-deficit hyperactivity disorder, predominantly hyperactive type: Secondary | ICD-10-CM | POA: Diagnosis not present

## 2019-09-02 MED ORDER — AMPHETAMINE-DEXTROAMPHETAMINE 30 MG PO TABS: 30.0000 mg | ORAL_TABLET | Freq: Two times a day (BID) | ORAL | 0 refills | Status: DC

## 2019-09-02 MED ORDER — DULOXETINE HCL 60 MG PO CPEP: ORAL_CAPSULE | ORAL | 2 refills | Status: DC

## 2019-09-02 NOTE — Assessment & Plan Note (Signed)
Excellent control compliance on nocturnal CPAP.  Patient continue on current settings of CPAP 10 cm H2O.  Plan  Patient Instructions  Keep up the good work for with CPAP each night .  Work on healthy weight loss.  Do not drive if sleepy .  Follow up with Dr. Elsworth Soho  In 4-6 months and As needed  For sleep apnea .   Albuterol Inhaler As needed   Work on not smoking .  Follow up with Dr. Melvyn Novas  In 6-8 weeks with PFTs.

## 2019-09-02 NOTE — Progress Notes (Signed)
Virtual Visit via Telephone Note  I connected with [name] on 09/02/19 at  1:00 PM EDT by telephone and verified that I am speaking with the correct person using two identifiers.   I discussed the limitations, risks, security and privacy concerns of performing an evaluation and management service by telephone and the availability of in person appointments. I also discussed with the patient that there may be a patient responsible charge related to this service. The patient expressed understanding and agreed to proceed.    I discussed the assessment and treatment plan with the patient. The patient was provided an opportunity to ask questions and all were answered. The patient agreed with the plan and demonstrated an understanding of the instructions.   The patient was advised to call back or seek an in-person evaluation if the symptoms worsen or if the condition fails to improve as anticipated.  I provided 15 minutes of non-face-to-face time during this encounter.   Sheila Spiller, MD  [facility] MD/PA/NP OP Progress Note  09/02/2019 1:32 PM [name]  MRN:  [id number]  Chief Complaint: depression, ADHD HPI: This patient is a 54 year old married female who lives with her husband in [place].  Currently her daughter and his daughter and their families are both living with them as well.  She is on disability.  The patient returns for follow-up after 3 months.  In general she is doing well.  She is very worried about her daughter who is pregnant and very depressed and refusing to get treatment up until very recently.  She states that her own mood is fairly stable she is sleeping well but she is quite worried.  She is not having any thoughts of self-harm or suicide and she denies serious anxiety.  She is focusing well with the Adderall. Visit Diagnosis:    ICD-10-CM   1. Moderate episode of recurrent major depressive disorder (HCC)  F33.1   2. Attention deficit  hyperactivity disorder (ADHD), predominantly hyperactive type  F90.1     Past Psychiatric History: She was hospitalized twice in her 48s and again in 2006 for drug overdose and again in November 2020 for Xanax overdose with suicidal intent  Past Medical History:  Past Medical History:  Diagnosis Date  . ADHD   . Alcohol abuse   . Anemia   . Anxiety   . Asthmatic bronchitis    normal PFT/ seen by pulmonary no evidence of COPD  . Atypical chest pain 10/05/2017  . Back pain   . Bilateral swelling of feet   . Chest pain   . Chest pain on respiration 03/25/2014  . Chronic bronchitis ([place])   . Chronic lower back pain   . Chronic respiratory failure ([place]) 10/16/2011   Newly 02 dep 24/7 p discharge from [facility] 01/2013  - 03/13/2014  Walked RA  2 laps @ 185 ft each stopped due to  Sob/ aching in legs, thirsty/ no desat @ slow pace   . Chronic respiratory failure with hypoxia (HCC)    On 2-3 L of oxygen at home  . Cigarette smoker 12/02/2010   Followed in Pulmonary clinic/ [facility]/ [place]   - Limits of effective care reviewed 12/22/2011    . Complication of anesthesia   . Constipation   . COPD (chronic obstructive pulmonary disease) ([place])   . COPD with chronic bronchitis ([place]) 10/01/2017  . Daily headache   . Depression   . Diabetic peripheral neuropathy ([place])   . DM (diabetes mellitus)  type II controlled, neurological manifestation ([place]) 12/02/2010  . Drug use   . Gallbladder problem   . Gastric erosions    EGD 08/2010.  [name] GERD (gastroesophageal reflux disease)   . Glaucoma   . H/O drug abuse ([place]) 11/12/2017   -- scanned document from outside source: [facility] in [place] which showed UDS positive for Adderall /amphetamine usage as well as positive urine for THC.  This test result was collected 08/11/2016 and reported 10/07/2016. - lso review of the chart shows another positive amphetamine, THC and METH in the urine back on 10/18/2015 under "care  everywhere". ---So   . Heavy menses   . High cholesterol   . History of blood transfusion    "related to low HgB" (10/05/2017)  . History of hiatal hernia   . HTN (hypertension)   . Hyperlipidemia associated with type 2 diabetes mellitus ([place]) 10/18/2015  . Hypertension associated with diabetes ([place]) 12/02/2010   D/c acei 12/22/2011 due to psuedowheeze and narcotic dependent cough> ? Improved - 37/34/2876 started bystolic in place of cozar due to cough    . IBS (irritable bowel syndrome)   . Increased urinary protein excretion   . Internal hemorrhoids    Colonoscopy 5/12.  . Iron deficiency anemia 10/01/2017  . Joint pain   . Mixed diabetic hyperlipidemia associated with type 2 diabetes mellitus ([place]) 10/01/2017  . On home oxygen therapy    "5L at night" (10/05/2017)  . OSA on CPAP   . Osteoarthritis    "back" (10/05/2017)  . Oxygen dependent 10/16/2011  . Pneumonia    "lots of times" (10/05/2017)  . PONV (postoperative nausea and vomiting)   . Poorly controlled diabetes mellitus ([place]) 11/12/2017  . Pulmonary infiltrates 12/22/2011   Followed in Pulmonary clinic/ [facility]/ [place]    - See CT Chest 05/05/11   . Shortness of breath   . Tachycardia    never had test done since no insurance  . Tobacco use disorder-current smoker greater than 40-pack-year history- since age 36 1 ppd 10/01/2017  . Type II diabetes mellitus ([place])   . Vitamin D deficiency     Past Surgical History:  Procedure Laterality Date  . CESAREAN SECTION  1988; 1989  . CHOLECYSTECTOMY OPEN  1990  . COLONOSCOPY  09/16/2010   OTL:XBWIOM colon/small internal hemorrhoids  . ESOPHAGOGASTRODUODENOSCOPY  09/16/2010   SLF: normal/mild gastritis  . ESOPHAGOGASTRODUODENOSCOPY N/A 10/19/2014   Procedure: ESOPHAGOGASTRODUODENOSCOPY (EGD);  Surgeon: Danie Binder, MD;  Location: [facility] END[facility] SUITE;  Service: Endoscopy;  Laterality: N/A;  830  . FRACTURE SURGERY    . HYSTEROSCOPY WITH THERMACHOICE  01/17/2012   Procedure:  HYSTEROSCOPY WITH THERMACHOICE;  Surgeon: Florian Buff, MD;  Location: [facility] ORS;  Service: Gynecology;  Laterality: N/A;  total therapy time: 9:13sec  D5W  18 ml in, D5W   3m out, temperture 87degrees celcious  . KIDNEY SURGERY     as child for blockages  . TONSILLECTOMY    . TUBAL LIGATION  1989  . TYMPANOSTOMY TUBE PLACEMENT Bilateral    "several times when I was a child"  . uterine ablation    . WRIST FRACTURE SURGERY Left 1995    Family Psychiatric History: see below  Family History:  Family History  Problem Relation Age of Onset  . Heart attack Father 472      deceased, etoh use  . Heart disease Father   . Alcohol abuse Father   .  Depression Father   . Heart failure Father   . Sudden death Father   . Heart attack Mother 63       deceased  . Diabetes Mother   . Breast cancer Mother   . Heart failure Mother        oxygen dependence, nonsmoker  . Heart disease Mother   . Depression Mother   . Cancer Mother   . Hypertension Mother   . Hyperlipidemia Mother   . Sudden death Mother   . Sleep apnea Mother   . Obesity Mother   . Liver disease Maternal Aunt 31       died while on liver transplant list  . Heart attack Maternal Grandmother        premature CAD  . Ulcers Sister   . Hypertension Sister   . Heart failure Sister   . Depression Sister   . Anxiety disorder Sister   . Colon cancer Neg Hx     Social History:  Social History   Socioeconomic History  . Marital status: Married    Spouse name: Not on file  . Number of children: 2  . Years of education: Not on file  . Highest education level: Not on file  Occupational History  . Occupation: unemployed  Tobacco Use  . Smoking status: Current Every Day Smoker    Packs/day: 1.50    Years: 40.00    Pack years: 60.00    Types: Cigarettes    Start date: 17  . Smokeless tobacco: Never Used  Substance and Sexual Activity  . Alcohol use: Yes    Comment: once a month - 3-4 mixed drinks  . Drug use: Yes     Types: Marijuana    Comment: daily use  . Sexual activity: Yes    Partners: Male    Birth control/protection: Surgical  Other Topics Concern  . Not on file  Social History Narrative  . Not on file   Social Determinants of Health   Financial Resource Strain:   . Difficulty of Paying Living Expenses:   Food Insecurity:   . Worried About Charity fundraiser in the Last Year:   . Arboriculturist in the Last Year:   Transportation Needs:   . Film/video editor (Medical):   [name] Lack of Transportation (Non-Medical):   Physical Activity:   . Days of Exercise per Week:   . Minutes of Exercise per Session:   Stress:   . Feeling of Stress :   Social Connections:   . Frequency of Communication with Friends and Family:   . Frequency of Social Gatherings with Friends and Family:   . Attends Religious Services:   . Active Member of Clubs or Organizations:   . Attends Archivist Meetings:   [name] Marital Status:     Allergies:  Allergies  Allergen Reactions  . Codeine Itching and Nausea Only  . Metformin And Related Diarrhea and Other (See Comments)    Stomach pain/nausea  . Wellbutrin [Bupropion Hcl] Hives  . Acyclovir And Related Rash    Metabolic Disorder Labs: Lab Results  Component Value Date   HGBA1C 7.6 (H) 06/30/2019   MPG 188.64 03/05/2019   MPG 160 (H) 03/10/2015   No results found for: PROLACTIN Lab Results  Component Value Date   CHOL 151 06/30/2019   TRIG 98 06/30/2019   HDL 48 06/30/2019   CHOLHDL 3.4 02/12/2018   VLDL 35 10/06/2017   LDLCALC 85 06/30/2019  [place] 74 02/12/2018   Lab Results  Component Value Date   TSH 1.860 06/30/2019   TSH 2.40 11/26/2018    Therapeutic Level Labs: No results found for: LITHIUM No results found for: VALPROATE No components found for:  CBMZ  Current Medications: Current Outpatient Medications  Medication Sig Dispense Refill  . ACCU-CHEK GUIDE test strip USE UP TO FOUR TIMES DAILY AS DIRECTED 200  strip 11  . Accu-Chek Softclix Lancets lancets USE UP TO FOUR TIMES DAILY AS DIRECTED 300 each 0  . albuterol (PROVENTIL) (2.5 MG/3ML) 0.083% nebulizer solution Take 3 mLs (2.5 mg total) by nebulization every 6 (six) hours as needed for wheezing or shortness of breath. 75 mL 12  . amphetamine-dextroamphetamine (ADDERALL) 30 MG tablet Take 1 tablet by mouth 2 (two) times daily. 60 tablet 0  . amphetamine-dextroamphetamine (ADDERALL) 30 MG tablet Take 1 tablet by mouth 2 (two) times daily. 60 tablet 0  . amphetamine-dextroamphetamine (ADDERALL) 30 MG tablet Take 1 tablet by mouth 2 (two) times daily. Fill after 11/01/2019 60 tablet 0  . aspirin EC 81 MG tablet Take 1 tablet (81 mg total) by mouth daily. 90 tablet 3  . atorvastatin (LIPITOR) 40 MG tablet Take 1 tablet (40 mg total) by mouth daily. 30 tablet 2  . b complex vitamins tablet Take 1 tablet by mouth daily.    . blood glucose meter kit and supplies Dispense based on patient and insurance preference. Use up to four times daily as directed. (FOR ICD-10 E10.9, E11.9). 1 each 0  . ciprofloxacin-dexamethasone (CIPRODEX) OTIC suspension Place 4-5 drops into the right ear 2 (two) times daily as needed (drainage).    . cycloSPORINE (RESTASIS) 0.05 % ophthalmic emulsion Place 1 drop into both eyes 2 (two) times daily.    . DULoxetine (CYMBALTA) 60 MG capsule TAKE 1 CAPSULE(60 MG) BY MOUTH AT BEDTIME 30 capsule 2  . fluticasone (FLONASE) 50 MCG/ACT nasal spray USE 1 SPRAY IN EACH NOSTRIL TWICE DAILY AFTER SINUS RINSES 16 g 2  . Insulin Pen Needle (BD PEN NEEDLE NANO U/F) 32G X 4 MM MISC USE AS DIRECTED WITH VICTOZA 100 each 11  . INVOKANA 300 MG TABS tablet TAKE 1 TABLET(300 MG) BY MOUTH DAILY BEFORE BREAKFAST 90 tablet 1  . isosorbide mononitrate (IMDUR) 30 MG 24 hr tablet TAKE 1 TABLET BY MOUTH EVERY DAY (Patient taking differently: Take 30 mg by mouth daily. ) 90 tablet 3  . Lancets Misc. (ACCU-CHEK SOFTCLIX LANCET DEV) KIT Check Blood sugar up to 4  times a day 1 kit 0  . liraglutide (VICTOZA) 18 MG/3ML SOPN INJECT 0.3 MLS (1.8 MG TOTAL) INTO THE SKIN DAILY. (Patient taking differently: Inject 1.8 mg into the skin daily. INJECT 0.3 MLS (1.8 MG TOTAL) INTO THE SKIN DAILY.) 27 mL 1  . losartan (COZAAR) 25 MG tablet Take 1 tablet (25 mg total) by mouth daily. 90 tablet 3  . pantoprazole (PROTONIX) 40 MG tablet Take 1 tablet (40 mg total) by mouth daily. 30 minutes before a meal. 90 tablet 3  . pregabalin (LYRICA) 150 MG capsule Take 1 capsule (150 mg total) by mouth 2 (two) times daily. No ReFills 180 capsule 1  . topiramate (TOPAMAX) 50 MG tablet Take 1 tablet (50 mg total) by mouth daily. (Patient taking differently: Take 50 mg by mouth daily after supper. ) 30 tablet 0  . Vitamin D, Ergocalciferol, (DRISDOL) 1.25 MG (50000 UNIT) CAPS capsule Take 1 capsule (50,000 Units total) by mouth every 7 (seven)  days. 4 capsule 0   Current Facility-Administered Medications  Medication Dose Route Frequency Provider Last Rate Last Admin  . ipratropium-albuterol (DUONEB) 0.5-2.5 (3) MG/3ML nebulizer solution 3 mL  3 mL Nebulization Q6H Opalski, , DO   3 mL at 01/10/18 1042     Musculoskeletal: Strength & Muscle Tone: within normal limits Gait & Station: normal Patient leans: N/A  Psychiatric Specialty Exam: Review of Systems  All other systems reviewed and are negative.   Last menstrual period 08/01/2014.There is no height or weight on file to calculate BMI.  General Appearance: NA  Eye Contact:  NA  Speech:  Clear and Coherent  Volume:  Normal  Mood:  Euthymic  Affect:  NA  Thought Process:  Goal Directed  Orientation:  Full (Time, Place, and Person)  Thought Content: Rumination   Suicidal Thoughts:  No  Homicidal Thoughts:  No  Memory:  Immediate;   Good Recent;   Good Remote;   Good  Judgement:  Good  Insight:  Good  Psychomotor Activity:  Normal  Concentration:  Concentration: Good and Attention Span: Good  Recall:  Good   Fund of Knowledge: Good  Language: Good  Akathisia:  No  Handed:  Right  AIMS (if indicated): not done  Assets:  Communication Skills Desire for Improvement Resilience Social Support Talents/Skills  ADL's:  Intact  Cognition: WNL  Sleep:  Good   Screenings: AIMS     Admission (Discharged) from 03/07/2019 in [facility] Admission (Discharged) from 09/12/2014 in [facility]  AIMS Total Score  0  0    AUDIT     Admission (Discharged) from 03/07/2019 in [facility] Admission (Discharged) from 09/12/2014 in [facility]  Alcohol Use Disorder Identification Test Final Score (AUDIT)  0  0    PHQ2-9     Office Visit from 06/30/2019 in [facility] Office Visit from 06/20/2019 in [facility] Office Visit from 09/24/2018 in [facility] [place] Visit from 07/09/2018 in [facility] Visit from 03/11/2018 in [facility] Total Score  5  0  2  3  0  PHQ-9 Total Score  17  --  _0 Assessment and Plan: This patient is a 54 year old female with a history of depression and anxiety.  She also has a history of ADD.  She is doing well on her current regimen and has not had no further suicidal thoughts by her report.  She will continue Cymbalta 60 mg daily for depression and Adderall 30 mg twice daily for ADD.  She will return to see me in 3 months   Sheila Spiller, MD 09/02/2019, 1:32 PM

## 2019-09-02 NOTE — Progress Notes (Deleted)
Psychiatric Initial Child/Adolescent Assessment   Patient Identification: [name] MRN:  [id number] Date of Evaluation:  09/02/2019 Referral Source: *** Chief Complaint:   Visit Diagnosis:    ICD-10-CM   1. Moderate episode of recurrent major depressive disorder (HCC)  F33.1   2. Attention deficit hyperactivity disorder (ADHD), predominantly hyperactive type  F90.1     History of Present Illness:: ***  Associated Signs/Symptoms: Depression Symptoms:  {DEPRESSION SYMPTOMS:20000} (Hypo) Manic Symptoms:  {BHH MANIC SYMPTOMS:22872} Anxiety Symptoms:  {BHH ANXIETY SYMPTOMS:22873} Psychotic Symptoms:  {BHH PSYCHOTIC SYMPTOMS:22874} PTSD Symptoms: {BHH PTSD SYMPTOMS:22875}  Past Psychiatric History: ***  Previous Psychotropic Medications: {YES/NO:21197}  Substance Abuse History in the last 12 months:  {yes no:314532}  Consequences of Substance Abuse: {BHH CONSEQUENCES OF SUBSTANCE ABUSE:22880}  Past Medical History:  Past Medical History:  Diagnosis Date  . ADHD   . Alcohol abuse   . Anemia   . Anxiety   . Asthmatic bronchitis    normal PFT/ seen by pulmonary no evidence of COPD  . Atypical chest pain 10/05/2017  . Back pain   . Bilateral swelling of feet   . Chest pain   . Chest pain on respiration 03/25/2014  . Chronic bronchitis ([place])   . Chronic lower back pain   . Chronic respiratory failure ([place]) 10/16/2011   Newly 02 dep 24/7 p discharge from [facility] 01/2013  - 03/13/2014  Walked RA  2 laps @ 185 ft each stopped due to  Sob/ aching in legs, thirsty/ no desat @ slow pace   . Chronic respiratory failure with hypoxia (HCC)    On 2-3 L of oxygen at home  . Cigarette smoker 12/02/2010   Followed in Pulmonary clinic/ [facility]/ [place]   - Limits of effective care reviewed 12/22/2011    . Complication of anesthesia   . Constipation   . COPD (chronic obstructive pulmonary disease) ([place])   . COPD with chronic bronchitis ([place]) 10/01/2017  . Daily headache    . Depression   . Diabetic peripheral neuropathy ([place])   . DM (diabetes mellitus) type II controlled, neurological manifestation ([place]) 12/02/2010  . Drug use   . Gallbladder problem   . Gastric erosions    EGD 08/2010.  [name] GERD (gastroesophageal reflux disease)   . Glaucoma   . H/O drug abuse ([place]) 11/12/2017   -- scanned document from outside source: [facility] in [place] which showed UDS positive for Adderall /amphetamine usage as well as positive urine for THC.  This test result was collected 08/11/2016 and reported 10/07/2016. - lso review of the chart shows another positive amphetamine, THC and METH in the urine back on 10/18/2015 under "care everywhere". ---So   . Heavy menses   . High cholesterol   . History of blood transfusion    "related to low HgB" (10/05/2017)  . History of hiatal hernia   . HTN (hypertension)   . Hyperlipidemia associated with type 2 diabetes mellitus ([place]) 10/18/2015  . Hypertension associated with diabetes ([place]) 12/02/2010   D/c acei 12/22/2011 due to psuedowheeze and narcotic dependent cough> ? Improved - 37/62/8315 started bystolic in place of cozar due to cough    . IBS (irritable bowel syndrome)   . Increased urinary protein excretion   . Internal hemorrhoids    Colonoscopy 5/12.  . Iron deficiency anemia 10/01/2017  . Joint pain   . Mixed diabetic hyperlipidemia associated with type 2 diabetes mellitus ([place]) 10/01/2017  . On home  oxygen therapy    "5L at night" (10/05/2017)  . OSA on CPAP   . Osteoarthritis    "back" (10/05/2017)  . Oxygen dependent 10/16/2011  . Pneumonia    "lots of times" (10/05/2017)  . PONV (postoperative nausea and vomiting)   . Poorly controlled diabetes mellitus ([place]) 11/12/2017  . Pulmonary infiltrates 12/22/2011   Followed in Pulmonary clinic/ [facility]/ [place]    - See CT Chest 05/05/11   . Shortness of breath   . Tachycardia    never had test done since no insurance  . Tobacco use  disorder-current smoker greater than 40-pack-year history- since age 76 1 ppd 10/01/2017  . Type II diabetes mellitus ([place])   . Vitamin D deficiency     Past Surgical History:  Procedure Laterality Date  . CESAREAN SECTION  1988; 1989  . CHOLECYSTECTOMY OPEN  1990  . COLONOSCOPY  09/16/2010   HGD:JMEQAS colon/small internal hemorrhoids  . ESOPHAGOGASTRODUODENOSCOPY  09/16/2010   SLF: normal/mild gastritis  . ESOPHAGOGASTRODUODENOSCOPY N/A 10/19/2014   Procedure: ESOPHAGOGASTRODUODENOSCOPY (EGD);  Surgeon: Danie Binder, MD;  Location: [facility] END[facility] SUITE;  Service: Endoscopy;  Laterality: N/A;  830  . FRACTURE SURGERY    . HYSTEROSCOPY WITH THERMACHOICE  01/17/2012   Procedure: HYSTEROSCOPY WITH THERMACHOICE;  Surgeon: Florian Buff, MD;  Location: [facility] ORS;  Service: Gynecology;  Laterality: N/A;  total therapy time: 9:13sec  D5W  18 ml in, D5W   68m out, temperture 87degrees celcious  . KIDNEY SURGERY     as child for blockages  . TONSILLECTOMY    . TUBAL LIGATION  1989  . TYMPANOSTOMY TUBE PLACEMENT Bilateral    "several times when I was a child"  . uterine ablation    . WRIST FRACTURE SURGERY Left 1995    Family Psychiatric History: ***  Family History:  Family History  Problem Relation Age of Onset  . Heart attack Father 47      deceased, etoh use  . Heart disease Father   . Alcohol abuse Father   . Depression Father   . Heart failure Father   . Sudden death Father   . Heart attack Mother 514      deceased  . Diabetes Mother   . Breast cancer Mother   . Heart failure Mother        oxygen dependence, nonsmoker  . Heart disease Mother   . Depression Mother   . Cancer Mother   . Hypertension Mother   . Hyperlipidemia Mother   . Sudden death Mother   . Sleep apnea Mother   . Obesity Mother   . Liver disease Maternal AAunt 62      died while on liver transplant list  . Heart attack Maternal Grandmother        premature CAD  . Ulcers Sister   . Hypertension Sister   .  Heart failure Sister   . Depression Sister   . Anxiety disorder Sister   . Colon cancer Neg Hx     Social History:   Social History   Socioeconomic History  . Marital status: Married    Spouse name: Not on file  . Number of children: 2  . Years of education: Not on file  . Highest education level: Not on file  Occupational History  . Occupation: unemployed  Tobacco Use  . Smoking status: Current Every Day Smoker    Packs/day: 1.50    Years: 40.00    Pack  years: 60.00    Types: Cigarettes    Start date: 19  . Smokeless tobacco: Never Used  Substance and Sexual Activity  . Alcohol use: Yes    Comment: once a month - 3-4 mixed drinks  . Drug use: Yes    Types: Marijuana    Comment: daily use  . Sexual activity: Yes    Partners: Male    Birth control/protection: Surgical  Other Topics Concern  . Not on file  Social History Narrative  . Not on file   Social Determinants of Health   Financial Resource Strain:   . Difficulty of Paying Living Expenses:   Food Insecurity:   . Worried About Charity fundraiser in the Last Year:   . Arboriculturist in the Last Year:   Transportation Needs:   . Film/video editor (Medical):   [name] Lack of Transportation (Non-Medical):   Physical Activity:   . Days of Exercise per Week:   . Minutes of Exercise per Session:   Stress:   . Feeling of Stress :   Social Connections:   . Frequency of Communication with Friends and Family:   . Frequency of Social Gatherings with Friends and Family:   . Attends Religious Services:   . Active Member of Clubs or Organizations:   . Attends Archivist Meetings:   [name] Marital Status:     Additional Social History: ***   Developmental History: Prenatal History: *** Birth History: *** Postnatal Infancy: *** Developmental History: *** Milestones:  Sit-Up: ***  Crawl: ***  Walk: ***  Speech: *** School History: *** Legal History: *** Hobbies/Interests: ***  Allergies:    Allergies  Allergen Reactions  . Codeine Itching and Nausea Only  . Metformin And Related Diarrhea and Other (See Comments)    Stomach pain/nausea  . Wellbutrin [Bupropion Hcl] Hives  . Acyclovir And Related Rash    Metabolic Disorder Labs: Lab Results  Component Value Date   HGBA1C 7.6 (H) 06/30/2019   MPG 188.64 03/05/2019   MPG 160 (H) 03/10/2015   No results found for: PROLACTIN Lab Results  Component Value Date   CHOL 151 06/30/2019   TRIG 98 06/30/2019   HDL 48 06/30/2019   CHOLHDL 3.4 02/12/2018   VLDL 35 10/06/2017   LDLCALC 85 06/30/2019   LDLCALC 74 02/12/2018   Lab Results  Component Value Date   TSH 1.860 06/30/2019    Therapeutic Level Labs: No results found for: LITHIUM No results found for: CBMZ No results found for: VALPROATE  Current Medications: Current Outpatient Medications  Medication Sig Dispense Refill  . ACCU-CHEK GUIDE test strip USE UP TO FOUR TIMES DAILY AS DIRECTED 200 strip 11  . Accu-Chek Softclix Lancets lancets USE UP TO FOUR TIMES DAILY AS DIRECTED 300 each 0  . albuterol (PROVENTIL) (2.5 MG/3ML) 0.083% nebulizer solution Take 3 mLs (2.5 mg total) by nebulization every 6 (six) hours as needed for wheezing or shortness of breath. 75 mL 12  . amphetamine-dextroamphetamine (ADDERALL) 30 MG tablet Take 1 tablet by mouth 2 (two) times daily. 60 tablet 0  . amphetamine-dextroamphetamine (ADDERALL) 30 MG tablet Take 1 tablet by mouth 2 (two) times daily. 60 tablet 0  . amphetamine-dextroamphetamine (ADDERALL) 30 MG tablet Take 1 tablet by mouth 2 (two) times daily. Fill after 11/01/2019 60 tablet 0  . aspirin EC 81 MG tablet Take 1 tablet (81 mg total) by mouth daily. 90 tablet 3  . atorvastatin (LIPITOR) 40 MG tablet  Take 1 tablet (40 mg total) by mouth daily. 30 tablet 2  . b complex vitamins tablet Take 1 tablet by mouth daily.    . blood glucose meter kit and supplies Dispense based on patient and insurance preference. Use up to four  times daily as directed. (FOR ICD-10 E10.9, E11.9). 1 each 0  . ciprofloxacin-dexamethasone (CIPRODEX) OTIC suspension Place 4-5 drops into the right ear 2 (two) times daily as needed (drainage).    . cycloSPORINE (RESTASIS) 0.05 % ophthalmic emulsion Place 1 drop into both eyes 2 (two) times daily.    . DULoxetine (CYMBALTA) 60 MG capsule TAKE 1 CAPSULE(60 MG) BY MOUTH AT BEDTIME 30 capsule 2  . fluticasone (FLONASE) 50 MCG/ACT nasal spray USE 1 SPRAY IN EACH NOSTRIL TWICE DAILY AFTER SINUS RINSES 16 g 2  . Insulin Pen Needle (BD PEN NEEDLE NANO U/F) 32G X 4 MM MISC USE AS DIRECTED WITH VICTOZA 100 each 11  . INVOKANA 300 MG TABS tablet TAKE 1 TABLET(300 MG) BY MOUTH DAILY BEFORE BREAKFAST 90 tablet 1  . isosorbide mononitrate (IMDUR) 30 MG 24 hr tablet TAKE 1 TABLET BY MOUTH EVERY DAY (Patient taking differently: Take 30 mg by mouth daily. ) 90 tablet 3  . Lancets Misc. (ACCU-CHEK SOFTCLIX LANCET DEV) KIT Check Blood sugar up to 4 times a day 1 kit 0  . liraglutide (VICTOZA) 18 MG/3ML SOPN INJECT 0.3 MLS (1.8 MG TOTAL) INTO THE SKIN DAILY. (Patient taking differently: Inject 1.8 mg into the skin daily. INJECT 0.3 MLS (1.8 MG TOTAL) INTO THE SKIN DAILY.) 27 mL 1  . losartan (COZAAR) 25 MG tablet Take 1 tablet (25 mg total) by mouth daily. 90 tablet 3  . pantoprazole (PROTONIX) 40 MG tablet Take 1 tablet (40 mg total) by mouth daily. 30 minutes before a meal. 90 tablet 3  . pregabalin (LYRICA) 150 MG capsule Take 1 capsule (150 mg total) by mouth 2 (two) times daily. No ReFills 180 capsule 1  . topiramate (TOPAMAX) 50 MG tablet Take 1 tablet (50 mg total) by mouth daily. (Patient taking differently: Take 50 mg by mouth daily after supper. ) 30 tablet 0  . Vitamin D, Ergocalciferol, (DRISDOL) 1.25 MG (50000 UNIT) CAPS capsule Take 1 capsule (50,000 Units total) by mouth every 7 (seven) days. 4 capsule 0   Current Facility-Administered Medications  Medication Dose Route Frequency Provider Last Rate  Last Admin  . ipratropium-albuterol (DUONEB) 0.5-2.5 (3) MG/3ML nebulizer solution 3 mL  3 mL Nebulization Q6H Opalski, Rylan Bernard, DO   3 mL at 01/10/18 1042    Musculoskeletal: Strength & Muscle Tone: {desc; muscle tone:32375} Gait & Station: {PE GAIT ED VZCH:88502} Patient leans: {Patient Leans:21022755}  Psychiatric Specialty Exam: Review of Systems  Last menstrual period 08/01/2014.There is no height or weight on file to calculate BMI.  General Appearance: {Appearance:22683}  Eye Contact:  {BHH EYE CONTACT:22684}  Speech:  {Speech:22685}  Volume:  {Volume (PAA):22686}  Mood:  {BHH MOOD:22306}  Affect:  {Affect (PAA):22687}  Thought Process:  {Thought Process (PAA):22688}  Orientation:  {BHH ORIENTATION (PAA):22689}  Thought Content:  {Thought Content:22690}  Suicidal Thoughts:  {ST/HT (PAA):22692}  Homicidal Thoughts:  {ST/HT (PAA):22692}  Memory:  {BHH MEMORY:22881}  Judgement:  {Judgement (PAA):22694}  Insight:  {Insight (PAA):22695}  Psychomotor Activity:  {Psychomotor (PAA):22696}  Concentration: {Concentration:21399}  Recall:  {BHH GOOD/FAIR/POOR:22877}  Fund of Knowledge: {BHH GOOD/FAIR/POOR:22877}  Language: {BHH GOOD/FAIR/POOR:22877}  Akathisia:  {BHH YES OR NO:22294}  Handed:  {Handed:22697}  AIMS (if indicated):  {Desc;  done/not:10129}  Assets:  {Assets (PAA):22698}  ADL's:  {BHH HYQ'M:57846}  Cognition: {chl bhh cognition:304700322}  Sleep:  {BHH GOOD/FAIR/POOR:22877}   Screenings: AIMS     Admission (Discharged) from 03/07/2019 in [place] Admission (Discharged) from 09/12/2014 in [facility]  AIMS Total Score  0  0    AUDIT     Admission (Discharged) from 03/07/2019 in [place] Admission (Discharged) from 09/12/2014 in [facility]  Alcohol Use Disorder Identification Test Final Score (AUDIT)  0  0    PHQ2-9     Office Visit from 06/30/2019  in [place] Office Visit from 06/20/2019 in [place] Visit from 09/24/2018 in [place] at [place] Visit from 07/09/2018 in [place] at [facility] Visit from 03/11/2018 in [place] at [facility] Total Score  5  0  2  3  0  PHQ-9 Total Score  17  --  9  12  3       Assessment and Plan: ***  [name], MD 5/11/20211:31 PM

## 2019-09-02 NOTE — Assessment & Plan Note (Addendum)
Chronic bronchitis in active smoker.  Smoking cessation discussed.  Check PFTs on return. Will need chest x-ray on return  Plan  Patient Instructions  Keep up the good work for with CPAP each night .  Work on healthy weight loss.  Do not drive if sleepy .  Follow up with Dr. Elsworth Soho  In 4-6 months and As needed  For sleep apnea .   Albuterol Inhaler As needed   Work on not smoking .  Follow up with Dr. Melvyn Novas  In 6-8 weeks with PFTs and chest xray

## 2019-09-03 ENCOUNTER — Other Ambulatory Visit: Payer: Self-pay

## 2019-09-03 ENCOUNTER — Encounter: Payer: Self-pay | Admitting: Cardiology

## 2019-09-03 ENCOUNTER — Ambulatory Visit: Payer: Medicare HMO | Admitting: Cardiology

## 2019-09-03 VITALS — BP 128/78 | HR 100 | Ht 64.0 in | Wt 266.8 lb

## 2019-09-03 DIAGNOSIS — Z01818 Encounter for other preprocedural examination: Secondary | ICD-10-CM | POA: Diagnosis not present

## 2019-09-03 DIAGNOSIS — E785 Hyperlipidemia, unspecified: Secondary | ICD-10-CM

## 2019-09-03 DIAGNOSIS — Z720 Tobacco use: Secondary | ICD-10-CM | POA: Diagnosis not present

## 2019-09-03 DIAGNOSIS — I25118 Atherosclerotic heart disease of native coronary artery with other forms of angina pectoris: Secondary | ICD-10-CM | POA: Diagnosis not present

## 2019-09-03 DIAGNOSIS — R079 Chest pain, unspecified: Secondary | ICD-10-CM

## 2019-09-03 DIAGNOSIS — Z01812 Encounter for preprocedural laboratory examination: Secondary | ICD-10-CM

## 2019-09-03 DIAGNOSIS — I10 Essential (primary) hypertension: Secondary | ICD-10-CM

## 2019-09-03 DIAGNOSIS — R931 Abnormal findings on diagnostic imaging of heart and coronary circulation: Secondary | ICD-10-CM

## 2019-09-03 MED ORDER — EZETIMIBE 10 MG PO TABS: 10.0000 mg | ORAL_TABLET | Freq: Every day | ORAL | 3 refills | Status: DC

## 2019-09-03 MED ORDER — METOPROLOL TARTRATE 25 MG PO TABS: 25.0000 mg | ORAL_TABLET | Freq: Two times a day (BID) | ORAL | 3 refills | Status: DC

## 2019-09-03 MED ORDER — NITROGLYCERIN 0.4 MG SL SUBL: 0.4000 mg | SUBLINGUAL_TABLET | SUBLINGUAL | 3 refills | Status: DC | PRN

## 2019-09-03 NOTE — Patient Instructions (Signed)
START metoprolol tartrate (Lopressor) 25 mg two times daily START Zetia 10 mg daily Take sublingual nitroglycerin AS NEEDED for chest pain    [place] [address] Dept: [phone] Loc: [place] [facility] [name]  09/03/2019  You are scheduled for a Cardiac Catheterization on Monday, May 17 with Dr. Peter Martinique.  1. Please arrive at the [facility] (Main Entrance A) at [facility]: [address] [place], [place] [address] at 7:00 AM (This time is two hours before your procedure to ensure your preparation). Free valet parking service is available.   Special note: Every effort is made to have your procedure done on time. Please understand that emergencies sometimes delay scheduled procedures.  2. Diet: Do not eat solid foods after midnight.  The patient may have clear liquids until 5am upon the day of the procedure.  3. Labs: Today in office  COVID TEST: Thursday 5/13 at 12:30 pm [place]  4. Medication instructions in preparation for your procedure:   Contrast Allergy: No  Hold Losartan AM of procedure  Hold Invokana AM of procedure  Hold Victoza AM of procedure  On the morning of your procedure, take your Aspirin and any morning medicines NOT listed above.  You may use sips of water.  5. Plan for one night stay--bring personal belongings. 6. Bring a current list of your medications and current insurance cards. 7. You MUST have a responsible person to drive you home. 8. Someone MUST be with you the first 24 hours after you arrive home or your discharge will be delayed. 9. Please wear clothes that are easy to get on and off and wear slip-on shoes.  Thank you for allowing [place] to care for you!   -- [facility]   Follow up:  2 weeks after cath with Dr. Gardiner Rhyme

## 2019-09-04 ENCOUNTER — Telehealth: Payer: Self-pay | Admitting: *Deleted

## 2019-09-04 ENCOUNTER — Other Ambulatory Visit (HOSPITAL_COMMUNITY)
Admission: RE | Admit: 2019-09-04 | Discharge: 2019-09-04 | Disposition: A | Discharge status: Home / Self Care | Payer: Medicare HMO | Source: Ambulatory Visit | Attending: Cardiology | Admitting: Cardiology

## 2019-09-04 DIAGNOSIS — Z01812 Encounter for preprocedural laboratory examination: Secondary | ICD-10-CM | POA: Diagnosis not present

## 2019-09-04 DIAGNOSIS — Z20822 Contact with and (suspected) exposure to covid-19: Secondary | ICD-10-CM | POA: Diagnosis not present

## 2019-09-04 LAB — BASIC METABOLIC PANEL
BUN/Creatinine Ratio: 19 (ref 9–23)
BUN: 13 mg/dL (ref 6–24)
CO2: 27 mmol/L (ref 20–29)
Calcium: 9.5 mg/dL (ref 8.7–10.2)
Chloride: 100 mmol/L (ref 96–106)
Creatinine, Ser: 0.67 mg/dL (ref 0.57–1.00)
GFR calc Af Amer: 116 mL/min/{1.73_m2} (ref >59)
GFR calc non Af Amer: 101 mL/min/{1.73_m2} (ref >59)
Glucose: 141 mg/dL — ABNORMAL HIGH (ref 65–99)
Potassium: 4.6 mmol/L (ref 3.5–5.2)
Sodium: 139 mmol/L (ref 134–144)

## 2019-09-04 LAB — CBC
Hematocrit: 42.4 % (ref 34.0–46.6)
Hemoglobin: 13.8 g/dL (ref 11.1–15.9)
MCH: 25.4 pg — ABNORMAL LOW (ref 26.6–33.0)
MCHC: 32.5 g/dL (ref 31.5–35.7)
MCV: 78 fL — ABNORMAL LOW (ref 79–97)
Platelets: 284 10*3/uL (ref 150–450)
RBC: 5.43 x10E6/uL — ABNORMAL HIGH (ref 3.77–5.28)
RDW: 15.8 % — ABNORMAL HIGH (ref 11.7–15.4)
WBC: 14.2 10*3/uL — ABNORMAL HIGH (ref 3.4–10.8)

## 2019-09-04 LAB — SARS CORONAVIRUS 2 (TAT 6-24 HRS): SARS Coronavirus 2: NEGATIVE

## 2019-09-04 NOTE — Telephone Encounter (Signed)
Pt contacted pre-catheterization scheduled at [facility] for: Monday Sep 08, 2019 9 AM Verified arrival time and place: [facility]) at: 7 AM   No solid food after midnight prior to cath, clear liquids until 5 AM day of procedure.  Hold: Invokana-AM of procedure Victoza-AM of procedure  Except hold medications AM meds can be  taken pre-cath with sip of water including: ASA 81 mg   Confirmed patient has responsible adult to drive home post procedure and observe 24 hours after arriving home: yes  You are allowed ONE visitor in the waiting room during your procedures. Both you and your visitor must wear masks.     COVID-19 Pre-Screening Questions:  . In the past 7 to 10 days have you had a cough,  shortness of breath, headache, congestion, fever (100 or greater) body aches, chills, sore throat, or sudden loss of taste or sense of smell? no . Have you been around anyone with known Covid 19 in the past 7 to 10 days? no . Have you been around anyone who is awaiting Covid 19 test results in the past 7 to 10 days? no . Have you been around anyone who  has mentioned symptoms of Covid 19 within the past 7 to 10 days? no  Reviewed procedure/mask/visitor instructions, COVID-19 screening questions with patient.

## 2019-09-04 NOTE — Telephone Encounter (Deleted)
Pt contacted pre-catheterization scheduled at [facility] for: Monday Sep 08, 2019 9 AM Verified arrival time and place: [facility]) at: 7 AM   No solid food after midnight prior to cath, clear liquids until 5 AM day of procedure.  Hold: Invokana-AM of procedure Victoza-AM of procedure  Except hold medications M meds can be  taken pre-cath with sip of water including: ASA 81 mg   Confirmed patient has responsible adult to drive home post procedure and observe 24 hours after arriving home:   You are allowed ONE visitor in the waiting room during your procedures. Both you and your visitor must wear masks.      COVID-19 Pre-Screening Questions:  . In the past 7 to 10 days have you had a cough,  shortness of breath, headache, congestion, fever (100 or greater) body aches, chills, sore throat, or sudden loss of taste or sense of smell? . Have you been around anyone with known Covid 19. . Have you been around anyone who is awaiting Covid 19 test results in the past 7 to 10 days? . Have you been around anyone who has been exposed to Covid 19, or has mentioned symptoms of Covid 19 within the past 7 to 10 days?  If you have any concerns/questions about symptoms patients report during screening (either on the phone or at [place]). Contact the provider seeing the patient adership team.

## 2019-09-04 NOTE — Telephone Encounter (Deleted)
Pt contacted pre-catheterization scheduled at [facility] for: Verified arrival time and place: [place] [facility]) at:   No solid food after midnight prior to cath, clear liquids until 5 AM day of procedure. Contrast allergy:  DO NOT TAKE ANY ORAL DIABETES MEDICATIONS THE MORNING OF THE PROCEDURE.  AM meds can be  taken pre-cath with sip of water including: ASA 81 mg   Confirmed patient has responsible adult to drive home post procedure and observe 24 hours after arriving home:   You are allowed ONE visitor in the waiting room during your procedures. Both you and your visitor must wear masks.

## 2019-09-05 ENCOUNTER — Other Ambulatory Visit: Payer: Self-pay | Admitting: *Deleted

## 2019-09-05 DIAGNOSIS — R931 Abnormal findings on diagnostic imaging of heart and coronary circulation: Secondary | ICD-10-CM

## 2019-09-05 DIAGNOSIS — R079 Chest pain, unspecified: Secondary | ICD-10-CM

## 2019-09-05 MED ORDER — SODIUM CHLORIDE 0.9% FLUSH: 3.0000 mL | Freq: Two times a day (BID) | INTRAVENOUS | Status: DC

## 2019-09-08 ENCOUNTER — Ambulatory Visit (HOSPITAL_COMMUNITY)
Admission: RE | Admit: 2019-09-08 | Discharge: 2019-09-08 | Disposition: A | Discharge status: Home / Self Care | Payer: Medicare HMO | Attending: Cardiology | Admitting: Cardiology

## 2019-09-08 ENCOUNTER — Other Ambulatory Visit: Payer: Self-pay

## 2019-09-08 ENCOUNTER — Encounter (HOSPITAL_COMMUNITY)
Admission: RE | Disposition: A | Discharge status: Home / Self Care | Payer: Medicare HMO | Source: Home / Self Care | Attending: Cardiology

## 2019-09-08 DIAGNOSIS — E559 Vitamin D deficiency, unspecified: Secondary | ICD-10-CM | POA: Diagnosis not present

## 2019-09-08 DIAGNOSIS — K219 Gastro-esophageal reflux disease without esophagitis: Secondary | ICD-10-CM | POA: Diagnosis not present

## 2019-09-08 DIAGNOSIS — E7800 Pure hypercholesterolemia, unspecified: Secondary | ICD-10-CM | POA: Diagnosis not present

## 2019-09-08 DIAGNOSIS — F17200 Nicotine dependence, unspecified, uncomplicated: Secondary | ICD-10-CM | POA: Diagnosis present

## 2019-09-08 DIAGNOSIS — I2510 Atherosclerotic heart disease of native coronary artery without angina pectoris: Secondary | ICD-10-CM | POA: Diagnosis not present

## 2019-09-08 DIAGNOSIS — G4733 Obstructive sleep apnea (adult) (pediatric): Secondary | ICD-10-CM | POA: Diagnosis not present

## 2019-09-08 DIAGNOSIS — F419 Anxiety disorder, unspecified: Secondary | ICD-10-CM | POA: Diagnosis not present

## 2019-09-08 DIAGNOSIS — Z8249 Family history of ischemic heart disease and other diseases of the circulatory system: Secondary | ICD-10-CM | POA: Insufficient documentation

## 2019-09-08 DIAGNOSIS — I10 Essential (primary) hypertension: Secondary | ICD-10-CM | POA: Diagnosis not present

## 2019-09-08 DIAGNOSIS — R079 Chest pain, unspecified: Secondary | ICD-10-CM | POA: Diagnosis not present

## 2019-09-08 DIAGNOSIS — Z6841 Body Mass Index (BMI) 40.0 and over, adult: Secondary | ICD-10-CM | POA: Insufficient documentation

## 2019-09-08 DIAGNOSIS — J449 Chronic obstructive pulmonary disease, unspecified: Secondary | ICD-10-CM | POA: Diagnosis not present

## 2019-09-08 DIAGNOSIS — R931 Abnormal findings on diagnostic imaging of heart and coronary circulation: Secondary | ICD-10-CM

## 2019-09-08 DIAGNOSIS — Z7982 Long term (current) use of aspirin: Secondary | ICD-10-CM | POA: Diagnosis not present

## 2019-09-08 DIAGNOSIS — I25118 Atherosclerotic heart disease of native coronary artery with other forms of angina pectoris: Secondary | ICD-10-CM | POA: Diagnosis not present

## 2019-09-08 DIAGNOSIS — F17210 Nicotine dependence, cigarettes, uncomplicated: Secondary | ICD-10-CM | POA: Diagnosis not present

## 2019-09-08 DIAGNOSIS — F909 Attention-deficit hyperactivity disorder, unspecified type: Secondary | ICD-10-CM | POA: Insufficient documentation

## 2019-09-08 DIAGNOSIS — E785 Hyperlipidemia, unspecified: Secondary | ICD-10-CM | POA: Insufficient documentation

## 2019-09-08 DIAGNOSIS — Z794 Long term (current) use of insulin: Secondary | ICD-10-CM | POA: Diagnosis not present

## 2019-09-08 DIAGNOSIS — E1142 Type 2 diabetes mellitus with diabetic polyneuropathy: Secondary | ICD-10-CM | POA: Diagnosis not present

## 2019-09-08 DIAGNOSIS — Z79899 Other long term (current) drug therapy: Secondary | ICD-10-CM | POA: Diagnosis not present

## 2019-09-08 DIAGNOSIS — Z9981 Dependence on supplemental oxygen: Secondary | ICD-10-CM | POA: Insufficient documentation

## 2019-09-08 DIAGNOSIS — F329 Major depressive disorder, single episode, unspecified: Secondary | ICD-10-CM | POA: Insufficient documentation

## 2019-09-08 DIAGNOSIS — I209 Angina pectoris, unspecified: Secondary | ICD-10-CM | POA: Diagnosis present

## 2019-09-08 DIAGNOSIS — K589 Irritable bowel syndrome without diarrhea: Secondary | ICD-10-CM | POA: Insufficient documentation

## 2019-09-08 HISTORY — PX: LEFT HEART CATH AND CORONARY ANGIOGRAPHY: CATH118249

## 2019-09-08 LAB — GLUCOSE, CAPILLARY: Glucose-Capillary: 185 mg/dL — ABNORMAL HIGH (ref 70–99)

## 2019-09-08 SURGERY — LEFT HEART CATH AND CORONARY ANGIOGRAPHY
Anesthesia: LOCAL

## 2019-09-08 MED ORDER — SODIUM CHLORIDE 0.9 % IV SOLN: 250.0000 mL | INTRAVENOUS | Status: DC | PRN

## 2019-09-08 MED ORDER — FENTANYL CITRATE (PF) 100 MCG/2ML IJ SOLN
INTRAMUSCULAR | Status: DC | PRN
  Administered 2019-09-08: 25 ug via INTRAVENOUS

## 2019-09-08 MED ORDER — HEPARIN (PORCINE) IN NACL 1000-0.9 UT/500ML-% IV SOLN
INTRAVENOUS | Status: AC
  Filled 2019-09-08: qty 500

## 2019-09-08 MED ORDER — HEPARIN SODIUM (PORCINE) 1000 UNIT/ML IJ SOLN
INTRAMUSCULAR | Status: DC | PRN
  Administered 2019-09-08: 6000 [IU] via INTRAVENOUS

## 2019-09-08 MED ORDER — SODIUM CHLORIDE 0.9% FLUSH: 3.0000 mL | Freq: Two times a day (BID) | INTRAVENOUS | Status: DC

## 2019-09-08 MED ORDER — SODIUM CHLORIDE 0.9% FLUSH: 3.0000 mL | INTRAVENOUS | Status: DC | PRN

## 2019-09-08 MED ORDER — MIDAZOLAM HCL 2 MG/2ML IJ SOLN
INTRAMUSCULAR | Status: AC
  Filled 2019-09-08: qty 2

## 2019-09-08 MED ORDER — MIDAZOLAM HCL 2 MG/2ML IJ SOLN
INTRAMUSCULAR | Status: DC | PRN
  Administered 2019-09-08: 1 mg via INTRAVENOUS

## 2019-09-08 MED ORDER — FENTANYL CITRATE (PF) 100 MCG/2ML IJ SOLN
INTRAMUSCULAR | Status: AC
  Filled 2019-09-08: qty 2

## 2019-09-08 MED ORDER — LIDOCAINE HCL (PF) 1 % IJ SOLN
INTRAMUSCULAR | Status: AC
  Filled 2019-09-08: qty 30

## 2019-09-08 MED ORDER — LIDOCAINE HCL (PF) 1 % IJ SOLN
INTRAMUSCULAR | Status: DC | PRN
  Administered 2019-09-08: 2 mL

## 2019-09-08 MED ORDER — SODIUM CHLORIDE 0.9 % WEIGHT BASED INFUSION
3.0000 mL/kg/h | INTRAVENOUS | Status: AC
  Administered 2019-09-08: 3 mL/kg/h via INTRAVENOUS

## 2019-09-08 MED ORDER — ASPIRIN 81 MG PO CHEW: 81.0000 mg | CHEWABLE_TABLET | ORAL | Status: DC

## 2019-09-08 MED ORDER — HYDRALAZINE HCL 20 MG/ML IJ SOLN: 10.0000 mg | INTRAMUSCULAR | Status: DC | PRN

## 2019-09-08 MED ORDER — ACETAMINOPHEN 325 MG PO TABS: 650.0000 mg | ORAL_TABLET | ORAL | Status: DC | PRN

## 2019-09-08 MED ORDER — HEPARIN (PORCINE) IN NACL 1000-0.9 UT/500ML-% IV SOLN
INTRAVENOUS | Status: DC | PRN
  Administered 2019-09-08 (×2): 500 mL

## 2019-09-08 MED ORDER — HEPARIN SODIUM (PORCINE) 1000 UNIT/ML IJ SOLN
INTRAMUSCULAR | Status: AC
  Filled 2019-09-08: qty 1

## 2019-09-08 MED ORDER — VERAPAMIL HCL 2.5 MG/ML IV SOLN
INTRAVENOUS | Status: AC
  Filled 2019-09-08: qty 2

## 2019-09-08 MED ORDER — IOHEXOL 350 MG/ML SOLN
INTRAVENOUS | Status: DC | PRN
  Administered 2019-09-08: 60 mL

## 2019-09-08 MED ORDER — ONDANSETRON HCL 4 MG/2ML IJ SOLN: 4.0000 mg | Freq: Four times a day (QID) | INTRAMUSCULAR | Status: DC | PRN

## 2019-09-08 MED ORDER — SODIUM CHLORIDE 0.9 % WEIGHT BASED INFUSION: 1.0000 mL/kg/h | INTRAVENOUS | Status: AC

## 2019-09-08 MED ORDER — SODIUM CHLORIDE 0.9 % WEIGHT BASED INFUSION: 1.0000 mL/kg/h | INTRAVENOUS | Status: DC

## 2019-09-08 MED ORDER — VERAPAMIL HCL 2.5 MG/ML IV SOLN
INTRAVENOUS | Status: DC | PRN
  Administered 2019-09-08: 10 mL via INTRA_ARTERIAL

## 2019-09-08 SURGICAL SUPPLY — 10 items
CATH 5FR JL3.5 JR4 ANG PIG MP (CATHETERS) ×2 IMPLANT
DEVICE RAD COMP TR BAND LRG (VASCULAR PRODUCTS) ×2 IMPLANT
GLIDESHEATH SLEND SS 6F .021 (SHEATH) ×2 IMPLANT
GUIDEWIRE INQWIRE 1.5J.035X260 (WIRE) ×1 IMPLANT
INQWIRE 1.5J .035X260CM (WIRE) ×2
KIT HEART LEFT (KITS) ×2 IMPLANT
PACK CARDIAC CATHETERIZATION (CUSTOM PROCEDURE TRAY) ×2 IMPLANT
SHEATH PROBE COVER 6X72 (BAG) ×2 IMPLANT
TRANSDUCER W/STOPCOCK (MISCELLANEOUS) ×2 IMPLANT
TUBING CIL FLEX 10 FLL-RA (TUBING) ×2 IMPLANT

## 2019-09-08 NOTE — Discharge Instructions (Signed)
Radial Site Care  This sheet gives you information about how to care for yourself after your procedure. Your health care provider may also give you more specific instructions. If you have problems or questions, contact your health care provider. What can I expect after the procedure? After the procedure, it is common to have:  Bruising and tenderness at the catheter insertion area. Follow these instructions at home: Medicines  Take over-the-counter and prescription medicines only as told by your health care provider. Insertion site care  Follow instructions from your health care provider about how to take care of your insertion site. Make sure you: ? Wash your hands with soap and water before you change your bandage (dressing). If soap and water are not available, use hand sanitizer. ? Change your dressing as told by your health care provider. ? Leave stitches (sutures), skin glue, or adhesive strips in place. These skin closures may need to stay in place for 2 weeks or longer. If adhesive strip edges start to loosen and curl up, you may trim the loose edges. Do not remove adhesive strips completely unless your health care provider tells you to do that.  Check your insertion site every day for signs of infection. Check for: ? Redness, swelling, or pain. ? Fluid or blood. ? Pus or a bad smell. ? Warmth.  Do not take baths, swim, or use a hot tub until your health care provider approves.  You may shower 24-48 hours after the procedure, or as directed by your health care provider. ? Remove the dressing and gently wash the site with plain soap and water. ? Pat the area dry with a clean towel. ? Do not rub the site. That could cause bleeding.  Do not apply powder or lotion to the site. Activity   For 24 hours after the procedure, or as directed by your health care provider: ? Do not flex or bend the affected arm. ? Do not push or pull heavy objects with the affected arm. ? Do not  drive yourself home from the hospital or clinic. You may drive 24 hours after the procedure unless your health care provider tells you not to. ? Do not operate machinery or power tools.  Do not lift anything that is heavier than 10 lb (4.5 kg), or the limit that you are told, until your health care provider says that it is safe.  Ask your health care provider when it is okay to: ? Return to work or school. ? Resume usual physical activities or sports. ? Resume sexual activity. General instructions  If the catheter site starts to bleed, raise your arm and put firm pressure on the site. If the bleeding does not stop, get help right away. This is a medical emergency.  If you went home on the same day as your procedure, a responsible adult should be with you for the first 24 hours after you arrive home.  Keep all follow-up visits as told by your health care provider. This is important. Contact a health care provider if:  You have a fever.  You have redness, swelling, or yellow drainage around your insertion site. Get help right away if:  You have unusual pain at the radial site.  The catheter insertion area swells very fast.  The insertion area is bleeding, and the bleeding does not stop when you hold steady pressure on the area.  Your arm or hand becomes pale, cool, tingly, or numb. These symptoms may represent a serious problem   that is an emergency. Do not wait to see if the symptoms will go away. Get medical help right away. Call your local emergency services (911 in the [place] Do not drive yourself to the hospital. Summary  After the procedure, it is common to have bruising and tenderness at the site.  Follow instructions from your health care provider about how to take care of your radial site wound. Check the wound every day for signs of infection.  Do not lift anything that is heavier than 10 lb (4.5 kg), or the limit that you are told, until your health care provider says  that it is safe. This information is not intended to replace advice given to you by your health care provider. Make sure you discuss any questions you have with your health care provider. Document Revised: 05/16/2017 Document Reviewed: 05/16/2017 Elsevier Patient Education  2020 [employer].  

## 2019-09-08 NOTE — Progress Notes (Signed)
This note also relates to the following rows which could not be included: Pulse Rate - Cannot attach notes to unvalidated device data BP - Cannot attach notes to unvalidated device data

## 2019-09-08 NOTE — Interval H&P Note (Signed)
History and Physical Interval Note:  09/08/2019 9:27 AM  [name]  has presented today for surgery, with the diagnosis of abn ct cp.  The various methods of treatment have been discussed with the patient and family. After consideration of risks, benefits and other options for treatment, the patient has consented to  Procedure(s): LEFT HEART CATH AND CORONARY ANGIOGRAPHY (N/A) as a surgical intervention.  The patient's history has been reviewed, patient examined, no change in status, stable for surgery.  I have reviewed the patient's chart and labs.  Questions were answered to the patient's satisfaction.   Cath Lab Visit (complete for each Cath Lab visit)  Clinical Evaluation Leading to the Procedure:   ACS: No.  Non-ACS:    Anginal Classification: CCS II  Anti-ischemic medical therapy: Maximal Therapy (2 or more classes of medications)  Non-Invasive Test Results: Intermediate-risk stress test findings: cardiac mortality 1-3%/year  Prior CABG: No previous CABG        Collier Salina [facility] 09/08/2019 9:27 AM

## 2019-09-13 DIAGNOSIS — G4733 Obstructive sleep apnea (adult) (pediatric): Secondary | ICD-10-CM | POA: Diagnosis not present

## 2019-09-28 NOTE — Progress Notes (Signed)
Cardiology Office Note:    Date:  09/29/2019   ID:  [name], DOB 1965/05/07, MRN [id number]  PCP:  Ronnald Nian, DO  Cardiologist:  Sinclair Grooms, MD  Electrophysiologist:  None   Referring MD: Ronnald Nian, DO    Chief Complaint  Patient presents with  . Chest Pain   History of Present Illness:    [name] is a 54 y.o. female with a hx of COPD, OSA, type 2 diabetes, obesity, hyperlipidemia, hypertension, anxiety and depression who presents for follow-up.  She was referred by Dr. Lucia Gaskins for preop evaluation prior to tympanoplasty, initially seen on 09/03/2019.  She has had repeated ear infections, and tympanoplasty planned.  She had a work-up for chest pain in 2019.  Underwent coronary CTA on 12/04/2017 which showed calcium score 411 (99th percentile) mild to moderate diffuse CAD.  Technically difficult study given morbid obesity and high heart rates despite metoprolol, Cardizem, and Xanax.  CT FFR was sent which showed focal severe stenosis (CT FFR 0.78) in the distal RCA prior to the bifurcation of the PDA and PLA.  TTE on 10/06/2017 showed LVEF 55 to 24%, grade 1 diastolic dysfunction, no significant valvular disease, normal RV function.  Patient reports that she did not follow-up with cardiology following her coronary CTA.  States that she has continued to have exertional chest pain.  Reports that exertion such as walking up a flight of stairs will cause her to have left-sided, sharp pain that resolves with rest.  Given her coronary CTA findings and ongoing chest pain, cardiac catheterization was done on 09/08/2019, which showed nonobstructive CAD (proximal to mid LAD 25%, proximal to mid circumflex 20%, proximal RCA 30%, distal RCA 30%), normal LV systolic function, LVEDP 22 mmHg.  Reports that she has been having lethargy and gastrointestinal issues on atorvastatin 80 mg daily, was decreased to 40 mg daily and symptoms resolved. Family history  includes sister recently died of MI at age 75.  Mother died of CHF.  Patient reports that she continues to smoke 1ppd.  Has smoked for 40 years.  Also smokes marijuana.    Since last clinic visit, she reports that she continues to have intermittent chest pain as well as shortness of breath.  Also reports lower extremity edema.  She does not have a scale at home so has not been weighing herself.  She continues to smoke, states that she is currently at about 1 pack/day.    Wt Readings from Last 3 Encounters:  09/29/19 264 lb 12.8 oz (120.1 kg)  09/08/19 264 lb (119.7 kg)  09/03/19 266 lb 12.8 oz (121 kg)      Past Medical History:  Diagnosis Date  . ADHD   . Alcohol abuse   . Anemia   . Anxiety   . Asthmatic bronchitis    normal PFT/ seen by pulmonary no evidence of COPD  . Atypical chest pain 10/05/2017  . Back pain   . Bilateral swelling of feet   . Chest pain   . Chest pain on respiration 03/25/2014  . Chronic bronchitis ([place])   . Chronic lower back pain   . Chronic respiratory failure ([place]) 10/16/2011   Newly 02 dep 24/7 p discharge from [facility] 01/2013  - 03/13/2014  Walked RA  2 laps @ 185 ft each stopped due to  Sob/ aching in legs, thirsty/ no desat @ slow pace   . Chronic respiratory failure with hypoxia (HCC)    On 2-3 L  of oxygen at home  . Cigarette smoker 12/02/2010   Followed in Pulmonary clinic/ [facility]/ [place]   - Limits of effective care reviewed 12/22/2011    . Complication of anesthesia   . Constipation   . COPD (chronic obstructive pulmonary disease) ([place])   . COPD with chronic bronchitis ([place]) 10/01/2017  . Daily headache   . Depression   . Diabetic peripheral neuropathy ([place])   . DM (diabetes mellitus) type II controlled, neurological manifestation ([place]) 12/02/2010  . Drug use   . Gallbladder problem   . Gastric erosions    EGD 08/2010.  [name] GERD (gastroesophageal reflux disease)   . Glaucoma   . H/O drug abuse ([place]) 11/12/2017   -- scanned document  from outside source: [facility] in [place] which showed UDS positive for Adderall /amphetamine usage as well as positive urine for THC.  This test result was collected 08/11/2016 and reported 10/07/2016. - lso review of the chart shows another positive amphetamine, THC and METH in the urine back on 10/18/2015 under "care everywhere". ---So   . Heavy menses   . High cholesterol   . History of blood transfusion    "related to low HgB" (10/05/2017)  . History of hiatal hernia   . HTN (hypertension)   . Hyperlipidemia associated with type 2 diabetes mellitus ([place]) 10/18/2015  . Hypertension associated with diabetes ([place]) 12/02/2010   D/c acei 12/22/2011 due to psuedowheeze and narcotic dependent cough> ? Improved - 42/70/6237 started bystolic in place of cozar due to cough    . IBS (irritable bowel syndrome)   . Increased urinary protein excretion   . Internal hemorrhoids    Colonoscopy 5/12.  . Iron deficiency anemia 10/01/2017  . Joint pain   . Mixed diabetic hyperlipidemia associated with type 2 diabetes mellitus ([place]) 10/01/2017  . On home oxygen therapy    "5L at night" (10/05/2017)  . OSA on CPAP   . Osteoarthritis    "back" (10/05/2017)  . Oxygen dependent 10/16/2011  . Pneumonia    "lots of times" (10/05/2017)  . PONV (postoperative nausea and vomiting)   . Poorly controlled diabetes mellitus ([place]) 11/12/2017  . Pulmonary infiltrates 12/22/2011   Followed in Pulmonary clinic/ [facility]/ [place]    - See CT Chest 05/05/11   . Shortness of breath   . Tachycardia    never had test done since no insurance  . Tobacco use disorder-current smoker greater than 40-pack-year history- since age 65 1 ppd 10/01/2017  . Type II diabetes mellitus ([place])   . Vitamin D deficiency     Past Surgical History:  Procedure Laterality Date  . CESAREAN SECTION  1988; 1989  . CHOLECYSTECTOMY OPEN  1990  . COLONOSCOPY  09/16/2010   SEG:BTDVVO colon/small internal  hemorrhoids  . ESOPHAGOGASTRODUODENOSCOPY  09/16/2010   SLF: normal/mild gastritis  . ESOPHAGOGASTRODUODENOSCOPY N/A 10/19/2014   Procedure: ESOPHAGOGASTRODUODENOSCOPY (EGD);  Surgeon: Danie Binder, MD;  Location: [facility];  Service: Endoscopy;  Laterality: N/A;  830  . FRACTURE SURGERY    . HYSTEROSCOPY WITH THERMACHOICE  01/17/2012   Procedure: HYSTEROSCOPY WITH THERMACHOICE;  Surgeon: Florian Buff, MD;  Location: [facility] ORS;  Service: Gynecology;  Laterality: N/A;  total therapy time: 9:13sec  D5W  18 ml in, D5W   55m out, temperture 87degrees celcious  . KIDNEY SURGERY     as child for blockages  . LEFT HEART CATH AND CORONARY ANGIOGRAPHY N/A 09/08/2019  Procedure: LEFT HEART CATH AND CORONARY ANGIOGRAPHY;  Surgeon: Martinique, Peter M, MD;  Location: [facility];  Service: Cardiovascular;  Laterality: N/A;  . TONSILLECTOMY    . TUBAL LIGATION  1989  . TYMPANOSTOMY TUBE PLACEMENT Bilateral    "several times when I was a child"  . uterine ablation    . WRIST FRACTURE SURGERY Left 1995    Current Medications: Current Meds  Medication Sig  . ACCU-CHEK GUIDE test strip USE UP TO FOUR TIMES DAILY AS DIRECTED  . Accu-Chek Softclix Lancets lancets USE UP TO FOUR TIMES DAILY AS DIRECTED  . albuterol (PROVENTIL) (2.5 MG/3ML) 0.083% nebulizer solution Take 3 mLs (2.5 mg total) by nebulization every 6 (six) hours as needed for wheezing or shortness of breath.  . amphetamine-dextroamphetamine (ADDERALL) 30 MG tablet Take 1 tablet by mouth 2 (two) times daily.  [name] aspirin EC 81 MG tablet Take 1 tablet (81 mg total) by mouth daily.  [name] atorvastatin (LIPITOR) 40 MG tablet Take 1 tablet (40 mg total) by mouth daily.  . blood glucose meter kit and supplies Dispense based on patient and insurance preference. Use up to four times daily as directed. (FOR ICD-10 E10.9, E11.9).  [name] Cholecalciferol (VITAMIN D-3) 125 MCG (5000 UT) TABS Take 5,000 Units by mouth daily.  . ciprofloxacin-dexamethasone  (CIPRODEX) OTIC suspension Place 4-5 drops into the right ear 2 (two) times daily as needed (drainage).  . cycloSPORINE (RESTASIS) 0.05 % ophthalmic emulsion Place 1 drop into both eyes 2 (two) times daily.  . DULoxetine (CYMBALTA) 60 MG capsule TAKE 1 CAPSULE(60 MG) BY MOUTH AT BEDTIME (Patient taking differently: Take 60 mg by mouth at bedtime. TAKE 1 CAPSULE(60 MG) BY MOUTH AT BEDTIME)  . ezetimibe (ZETIA) 10 MG tablet Take 1 tablet (10 mg total) by mouth daily.  . fluticasone (FLONASE) 50 MCG/ACT nasal spray USE 1 SPRAY IN EACH NOSTRIL TWICE DAILY AFTER SINUS RINSES (Patient taking differently: Place 1 spray into both nostrils in the morning and at bedtime. USE 1 SPRAY IN EACH NOSTRIL TWICE DAILY AFTER SINUS RINSES)  . Insulin Pen Needle (BD PEN NEEDLE NANO U/F) 32G X 4 MM MISC USE AS DIRECTED WITH VICTOZA  . INVOKANA 300 MG TABS tablet TAKE 1 TABLET(300 MG) BY MOUTH DAILY BEFORE BREAKFAST (Patient taking differently: Take 300 mg by mouth daily before breakfast. )  . isosorbide mononitrate (IMDUR) 30 MG 24 hr tablet TAKE 1 TABLET BY MOUTH EVERY DAY (Patient taking differently: Take 30 mg by mouth daily. )  . Lancets Misc. (ACCU-CHEK SOFTCLIX LANCET DEV) KIT Check Blood sugar up to 4 times a day  . liraglutide (VICTOZA) 18 MG/3ML SOPN INJECT 0.3 MLS (1.8 MG TOTAL) INTO THE SKIN DAILY. (Patient taking differently: Inject 1.8 mg into the skin daily. INJECT 0.3 MLS (1.8 MG TOTAL) INTO THE SKIN DAILY.)  . losartan (COZAAR) 25 MG tablet Take 1 tablet (25 mg total) by mouth daily.  . metoprolol tartrate (LOPRESSOR) 25 MG tablet Take 1 tablet (25 mg total) by mouth 2 (two) times daily.  . Multiple Vitamins-Minerals (MULTIVITAMIN WITH MINERALS) tablet Take 1 tablet by mouth daily.  . nitroGLYCERIN (NITROSTAT) 0.4 MG SL tablet Place 1 tablet (0.4 mg total) under the tongue every 5 (five) minutes as needed for chest pain.  . pantoprazole (PROTONIX) 40 MG tablet Take 1 tablet (40 mg total) by mouth daily. 30  minutes before a meal.  . pregabalin (LYRICA) 150 MG capsule Take 1 capsule (150 mg total) by mouth 2 (  two) times daily. No ReFills  . topiramate (TOPAMAX) 50 MG tablet Take 1 tablet (50 mg total) by mouth daily.  . Vitamin D, Ergocalciferol, (DRISDOL) 1.25 MG (50000 UNIT) CAPS capsule Take 1 capsule (50,000 Units total) by mouth every 7 (seven) days.   Current Facility-Administered Medications for the 09/29/19 encounter (Office Visit) with Donato Heinz, MD  Medication  . ipratropium-albuterol (DUONEB) 0.5-2.5 (3) MG/3ML nebulizer solution 3 mL  . sodium chloride flush (NS) 0.9 % injection 3 mL     Allergies:   Codeine, Metformin and related, Wellbutrin [bupropion hcl], and Acyclovir and related   Social History   Socioeconomic History  . Marital status: Married    Spouse name: Not on file  . Number of children: 2  . Years of education: Not on file  . Highest education level: Not on file  Occupational History  . Occupation: unemployed  Tobacco Use  . Smoking status: Current Every Day Smoker    Packs/day: 1.50    Years: 40.00    Pack years: 60.00    Types: Cigarettes    Start date: 29  . Smokeless tobacco: Never Used  Substance and Sexual Activity  . Alcohol use: Yes    Comment: once a month - 3-4 mixed drinks  . Drug use: Yes    Types: Marijuana    Comment: daily use  . Sexual activity: Yes    Partners: Male    Birth control/protection: Surgical  Other Topics Concern  . Not on file  Social History Narrative  . Not on file   Social Determinants of Health   Financial Resource Strain:   . Difficulty of Paying Living Expenses:   Food Insecurity:   . Worried About Charity fundraiser in the Last Year:   . Arboriculturist in the Last Year:   Transportation Needs:   . Film/video editor (Medical):   [name] Lack of Transportation (Non-Medical):   Physical Activity:   . Days of Exercise per Week:   . Minutes of Exercise per Session:   Stress:   . Feeling of  Stress :   Social Connections:   . Frequency of Communication with Friends and Family:   . Frequency of Social Gatherings with Friends and Family:   . Attends Religious Services:   . Active Member of Clubs or Organizations:   . Attends Archivist Meetings:   [name] Marital Status:      Family History: The patient's family history includes Alcohol abuse in her father; Anxiety disorder in her sister; Breast cancer in her mother; Cancer in her mother; Depression in her father, mother, and sister; Diabetes in her mother; Heart attack in her maternal grandmother; Heart attack (age of onset: 62) in her father; Heart attack (age of onset: 25) in her mother; Heart disease in her father and mother; Heart failure in her father, mother, and sister; Hyperlipidemia in her mother; Hypertension in her mother and sister; Liver disease (age of onset: 11) in her maternal aunt; Obesity in her mother; Sleep apnea in her mother; Sudden death in her father and mother; Ulcers in her sister. There is no history of Colon cancer.  ROS:   Please see the history of present illness.    All other systems reviewed and are negative.  EKGs/Labs/Other Studies Reviewed:    The following studies were reviewed today:   EKG:  EKG is ordered today.  The ekg ordered today demonstrates normal sinus rhythm, rate 75, no ST/T  abnormality  Cath 09/08/19:  Prox LAD to Mid LAD lesion is 25% stenosed.  Prox Cx to Mid Cx lesion is 20% stenosed.  Prox RCA lesion is 30% stenosed.  Dist RCA lesion is 30% stenosed.  The left ventricular systolic function is normal.  LV end diastolic pressure is moderately elevated.  The left ventricular ejection fraction is 55-65% by visual estimate.   1. Nonobstructive CAD 2. Normal LV function 3. Moderately elevated LVEDP 22 mm Hg  Plan: medical management and risk factor modification.  Recent Labs: 03/07/2019: Magnesium 1.7 06/30/2019: TSH 1.860 08/15/2019: ALT 16 09/03/2019: BUN  13; Creatinine, Ser 0.67; Hemoglobin 13.8; Platelets 284; Potassium 4.6; Sodium 139  Recent Lipid Panel    Component Value Date/Time   CHOL 151 06/30/2019 1345   TRIG 98 06/30/2019 1345   HDL 48 06/30/2019 1345   CHOLHDL 3.4 02/12/2018 0830   CHOLHDL 7.7 10/06/2017 0318   VLDL 35 10/06/2017 0318   LDLCALC 85 06/30/2019 1345   LDLDIRECT 121 (H) 10/03/2013 1655    Physical Exam:    VS:  BP 130/70   Pulse 75   Temp (!) 96.9 F (36.1 C)   Ht '5\' 4"'$  (1.626 m)   Wt 264 lb 12.8 oz (120.1 kg)   LMP 08/01/2014 Comment: irregular  SpO2 91%   BMI 45.45 kg/m     Wt Readings from Last 3 Encounters:  09/29/19 264 lb 12.8 oz (120.1 kg)  09/08/19 264 lb (119.7 kg)  09/03/19 266 lb 12.8 oz (121 kg)     GEN: in no acute distress HEENT: Normal NECK: No JVD; No carotid bruits LYMPHATICS: No lymphadenopathy CARDIAC: RRR, no murmurs, rubs, gallops RESPIRATORY:  Clear to auscultation without rales, wheezing or rhonchi  ABDOMEN: Soft, non-tender, non-distended MUSCULOSKELETAL:  No edema SKIN: Warm and dry NEUROLOGIC:  Alert and oriented x 3 PSYCHIATRIC:  Normal affect   ASSESSMENT:    1. CAD in native artery   2. Shortness of breath   3. Medication management   4. Pre-op evaluation   5. Essential hypertension   6. Hyperlipidemia, unspecified hyperlipidemia type   7. Tobacco use    PLAN:     CAD: Coronary CTA 2019 showed calcium score 411 (99th percentile) with diffuse disease.  CT FFR showed focal severe stenosis in the distal RCA prior to bifurcation of PDA and PLA (CT FFR 0.78).  She did not follow-up after her CTA.  She is having typical angina.  Cardiac catheterization was done on 09/08/2019, which showed nonobstructive CAD (proximal to mid LAD 25%, proximal to mid circumflex 20%, proximal RCA 30%, distal RCA 30%), normal LV systolic function, LVEDP 22 mmHg. -Continue aspirin 81 mg daily -Continue atorvastatin 40 mg daily.  LDL above goal less than 70 but patient did not  tolerate atorvastatin 80 mg daily.  Added Zetia 10 mg daily -Given typical angina and nonobstructive CAD on cath, suspect microvascular disease.  Continue Imdur 30 mg daily and metoprolol 25 mg twice daily -Elevated LVEDP on cath, suspect diastolic dysfunction.  Will check TTE and start Lasix 40 mg daily.  Will check BMP/magnesium in 1 week  Preop evaluation: Prior to tympanoplasty.  Cardiac catheterization with nonobstructive CAD.  TTE planned as above, otherwise no further cardiac work-up recommended prior to surgery.  Hypertension: On losartan 25 mg daily, Imdur 30 mg daily, and metoprolol 25 mg twice daily.  Appears controlled  Hyperlipidemia: On atorvastatin 40 mg daily.  LDL 85 06/30/19, above goal less than 70.  Did not  tolerate atorvastatin 80 mg daily.  Added Zetia 10 mg daily  Type 2 diabetes: A1c 7.6 06/30/19  Tobacco use: Patient counseled on the risk of tobacco use and cessation strongly encouraged.  RTC in 1 month  Medication Adjustments/Labs and Tests Ordered: Current medicines are reviewed at length with the patient today.  Concerns regarding medicines are outlined above.  Orders Placed This Encounter  Procedures  . Basic metabolic panel  . Magnesium  . EKG 12-Lead  . ECHOCARDIOGRAM COMPLETE   Meds ordered this encounter  Medications  . furosemide (LASIX) 40 MG tablet    Sig: Take 1 tablet (40 mg total) by mouth daily.    Dispense:  90 tablet    Refill:  3    Patient Instructions  Medication Instructions:  START furosemide (Lasix) 40 mg daily  *If you need a refill on your cardiac medications before your next appointment, please call your pharmacy*   Lab Work: Return in [place] (BMET, Mag)-you do NOT have to fast.  If you have labs (blood work) drawn today and your tests are completely normal, you will receive your results only by: [name] MyChart Message (if you have MyChart) OR . A paper copy in the mail If you have any lab test that is abnormal or we need to  change your treatment, we will call you to review the results.   Testing/Procedures: Your physician has requested that you have an echocardiogram. Echocardiography is a painless test that uses sound waves to create images of your heart. It provides your doctor with information about the size and shape of your heart and how well your heart's chambers and valves are working. This procedure takes approximately one hour. There are no restrictions for this procedure. This will be done at our [facility] location:  [place]: At [employer], you and your health needs are our priority.  As part of our continuing mission to provide you with exceptional heart care, we have created designated Provider Care Teams.  These Care Teams include your primary Cardiologist (physician) and Advanced Practice Providers (APPs -  Physician Assistants and Nurse Practitioners) who all work together to provide you with the care you need, when you need it.  We recommend signing up for the patient portal called "MyChart".  Sign up information is provided on this After Visit Summary.  MyChart is used to connect with patients for Virtual Visits (Telemedicine).  Patients are able to view lab/test results, encounter notes, upcoming appointments, etc.  Non-urgent messages can be sent to your provider as well.   To learn more about what you can do with MyChart, go to [email or web address].    Your next appointment:   1 month(s)  The format for your next appointment:   In Person  Provider:   Oswaldo Milian, MD   Other Instructions Weigh yourself daily, write it down.  Call office if you gain 3 lbs overnight or 5 lbs in 1 week.     Signed, Donato Heinz, MD  09/29/2019 6:23 PM    [place]

## 2019-09-29 ENCOUNTER — Other Ambulatory Visit: Payer: Self-pay

## 2019-09-29 ENCOUNTER — Ambulatory Visit: Payer: Medicare HMO | Admitting: Cardiology

## 2019-09-29 ENCOUNTER — Encounter: Payer: Self-pay | Admitting: Cardiology

## 2019-09-29 VITALS — BP 130/70 | HR 75 | Temp 96.9°F | Ht 64.0 in | Wt 264.8 lb

## 2019-09-29 DIAGNOSIS — I2510 Atherosclerotic heart disease of native coronary artery without angina pectoris: Secondary | ICD-10-CM | POA: Diagnosis not present

## 2019-09-29 DIAGNOSIS — Z720 Tobacco use: Secondary | ICD-10-CM | POA: Diagnosis not present

## 2019-09-29 DIAGNOSIS — I10 Essential (primary) hypertension: Secondary | ICD-10-CM

## 2019-09-29 DIAGNOSIS — Z79899 Other long term (current) drug therapy: Secondary | ICD-10-CM | POA: Diagnosis not present

## 2019-09-29 DIAGNOSIS — Z01818 Encounter for other preprocedural examination: Secondary | ICD-10-CM

## 2019-09-29 DIAGNOSIS — E785 Hyperlipidemia, unspecified: Secondary | ICD-10-CM

## 2019-09-29 DIAGNOSIS — R0602 Shortness of breath: Secondary | ICD-10-CM | POA: Diagnosis not present

## 2019-09-29 MED ORDER — FUROSEMIDE 40 MG PO TABS
40.0000 mg | ORAL_TABLET | Freq: Every day | ORAL | 3 refills | Status: DC
Start: 2019-09-29 — End: 2019-10-29

## 2019-09-29 NOTE — Patient Instructions (Signed)
Medication Instructions:  START furosemide (Lasix) 40 mg daily  *If you need a refill on your cardiac medications before your next appointment, please call your pharmacy*   Lab Work: Return in [employer] (BMET, Mag)-you do NOT have to fast.  If you have labs (blood work) drawn today and your tests are completely normal, you will receive your results only by: [name] MyChart Message (if you have MyChart) OR . A paper copy in the mail If you have any lab test that is abnormal or we need to change your treatment, we will call you to review the results.   Testing/Procedures: Your physician has requested that you have an echocardiogram. Echocardiography is a painless test that uses sound waves to create images of your heart. It provides your doctor with information about the size and shape of your heart and how well your heart's chambers and valves are working. This procedure takes approximately one hour. There are no restrictions for this procedure. This will be done at our [facility] location:  [place]: At [employer], you and your health needs are our priority.  As part of our continuing mission to provide you with exceptional heart care, we have created designated Provider Care Teams.  These Care Teams include your primary Cardiologist (physician) and Advanced Practice Providers (APPs -  Physician Assistants and Nurse Practitioners) who all work together to provide you with the care you need, when you need it.  We recommend signing up for the patient portal called "MyChart".  Sign up information is provided on this After Visit Summary.  MyChart is used to connect with patients for Virtual Visits (Telemedicine).  Patients are able to view lab/test results, encounter notes, upcoming appointments, etc.  Non-urgent messages can be sent to your provider as well.   To learn more about what you can do with MyChart, go to [email or web address].    Your next  appointment:   1 month(s)  The format for your next appointment:   In Person  Provider:   Oswaldo Milian, MD   Other Instructions Weigh yourself daily, write it down.  Call office if you gain 3 lbs overnight or 5 lbs in 1 week.

## 2019-09-30 ENCOUNTER — Telehealth: Payer: Self-pay | Admitting: Licensed Clinical Social Worker

## 2019-09-30 NOTE — Telephone Encounter (Signed)
CSW referred to assist patient with obtaining a scale. CSW contacted patient to inform scale will be delivered to home. Patient grateful for support and assistance. CSW available as needed. Jackie Jerran Tappan, LCSW, CCSW-MCS [phone]  

## 2019-10-09 DIAGNOSIS — R0602 Shortness of breath: Secondary | ICD-10-CM | POA: Diagnosis not present

## 2019-10-09 DIAGNOSIS — Z79899 Other long term (current) drug therapy: Secondary | ICD-10-CM | POA: Diagnosis not present

## 2019-10-09 DIAGNOSIS — I2510 Atherosclerotic heart disease of native coronary artery without angina pectoris: Secondary | ICD-10-CM | POA: Diagnosis not present

## 2019-10-09 LAB — BASIC METABOLIC PANEL
BUN/Creatinine Ratio: 15 (ref 9–23)
BUN: 11 mg/dL (ref 6–24)
CO2: 27 mmol/L (ref 20–29)
Calcium: 9.6 mg/dL (ref 8.7–10.2)
Chloride: 97 mmol/L (ref 96–106)
Creatinine, Ser: 0.73 mg/dL (ref 0.57–1.00)
GFR calc Af Amer: 109 mL/min/{1.73_m2} (ref >59)
GFR calc non Af Amer: 94 mL/min/{1.73_m2} (ref >59)
Glucose: 190 mg/dL — ABNORMAL HIGH (ref 65–99)
Potassium: 4.6 mmol/L (ref 3.5–5.2)
Sodium: 140 mmol/L (ref 134–144)

## 2019-10-09 LAB — MAGNESIUM: Magnesium: 2.2 mg/dL (ref 1.6–2.3)

## 2019-10-14 ENCOUNTER — Other Ambulatory Visit (HOSPITAL_COMMUNITY)
Admission: RE | Admit: 2019-10-14 | Discharge: 2019-10-14 | Disposition: A | Discharge status: Home / Self Care | Payer: Medicare HMO | Source: Ambulatory Visit | Attending: Internal Medicine | Admitting: Internal Medicine

## 2019-10-14 DIAGNOSIS — Z01812 Encounter for preprocedural laboratory examination: Secondary | ICD-10-CM | POA: Insufficient documentation

## 2019-10-14 DIAGNOSIS — Z20822 Contact with and (suspected) exposure to covid-19: Secondary | ICD-10-CM | POA: Insufficient documentation

## 2019-10-14 DIAGNOSIS — G4733 Obstructive sleep apnea (adult) (pediatric): Secondary | ICD-10-CM | POA: Diagnosis not present

## 2019-10-14 LAB — SARS CORONAVIRUS 2 (TAT 6-24 HRS): SARS Coronavirus 2: NEGATIVE

## 2019-10-15 ENCOUNTER — Other Ambulatory Visit: Payer: Self-pay | Admitting: Family Medicine

## 2019-10-15 DIAGNOSIS — E1142 Type 2 diabetes mellitus with diabetic polyneuropathy: Secondary | ICD-10-CM

## 2019-10-15 NOTE — Telephone Encounter (Signed)
Last OV 06/20/19 Last fill 11/21/18  #180/1

## 2019-10-17 ENCOUNTER — Ambulatory Visit: Payer: Medicare HMO | Admitting: Internal Medicine

## 2019-10-17 ENCOUNTER — Other Ambulatory Visit: Payer: Self-pay

## 2019-10-17 ENCOUNTER — Ambulatory Visit (INDEPENDENT_AMBULATORY_CARE_PROVIDER_SITE_OTHER): Payer: Medicare HMO | Admitting: Internal Medicine

## 2019-10-17 ENCOUNTER — Encounter: Payer: Self-pay | Admitting: Internal Medicine

## 2019-10-17 DIAGNOSIS — F17210 Nicotine dependence, cigarettes, uncomplicated: Secondary | ICD-10-CM | POA: Diagnosis not present

## 2019-10-17 DIAGNOSIS — Z9989 Dependence on other enabling machines and devices: Secondary | ICD-10-CM

## 2019-10-17 DIAGNOSIS — J449 Chronic obstructive pulmonary disease, unspecified: Secondary | ICD-10-CM | POA: Diagnosis not present

## 2019-10-17 DIAGNOSIS — G4733 Obstructive sleep apnea (adult) (pediatric): Secondary | ICD-10-CM

## 2019-10-17 DIAGNOSIS — R9389 Abnormal findings on diagnostic imaging of other specified body structures: Secondary | ICD-10-CM

## 2019-10-17 LAB — PULMONARY FUNCTION TEST
DL/VA % pred: 80 %
DL/VA: 3.44 ml/min/mmHg/L
DLCO cor % pred: 80 %
DLCO cor: 16.71 ml/min/mmHg
DLCO unc % pred: 81 %
DLCO unc: 16.91 ml/min/mmHg
FEF 25-75 Post: 2.69 L/sec
FEF 25-75 Pre: 2.18 L/sec
FEF2575-%Change-Post: 23 %
FEF2575-%Pred-Post: 101 %
FEF2575-%Pred-Pre: 82 %
FEV1-%Change-Post: 4 %
FEV1-%Pred-Post: 88 %
FEV1-%Pred-Pre: 84 %
FEV1-Post: 2.41 L
FEV1-Pre: 2.31 L
FEV1FVC-%Change-Post: 4 %
FEV1FVC-%Pred-Pre: 100 %
FEV6-%Change-Post: 0 %
FEV6-%Pred-Post: 85 %
FEV6-%Pred-Pre: 86 %
FEV6-Post: 2.89 L
FEV6-Pre: 2.9 L
FEV6FVC-%Change-Post: 0 %
FEV6FVC-%Pred-Post: 102 %
FEV6FVC-%Pred-Pre: 102 %
FVC-%Change-Post: 0 %
FVC-%Pred-Post: 83 %
FVC-%Pred-Pre: 83 %
FVC-Post: 2.89 L
FVC-Pre: 2.91 L
Post FEV1/FVC ratio: 83 %
Post FEV6/FVC ratio: 100 %
Pre FEV1/FVC ratio: 80 %
Pre FEV6/FVC Ratio: 100 %
RV % pred: 87 %
RV: 1.61 L
TLC % pred: 90 %
TLC: 4.56 L

## 2019-10-17 NOTE — Patient Instructions (Signed)
I strongly recommend you stop smoking now and get vaccinated for covid 19    Follow up with Dr Elsworth Soho but her is as needed

## 2019-10-17 NOTE — Progress Notes (Signed)
Subjective:    Patient ID: [name], female    DOB: [birth year]  MRN: [id number]    Brief patient profile:  41  yowf active smoker with pna as 54 year old but then could keep up with classmates fine at PE but developed recurrent exacerbations of cough starting in 2010 and referred to pulmonary clinic 12/22/2011 by Dr Buelah Manis p multiple admissions to [facility] since Nov 2012 with dx of pna and refractory cough and sob since then.  Spirometry normal 03/18/2012  And 10/17/2019   History of Present Illness  12/22/2011 1st pulmonary eval on ACEI  cc doe x walking outside in heat but does ok indoors, very sedentary, also  bad cough when not taking cough medications coughs so hard she vomits, more productive in am thick yellow, never bloody. Not using nebs x sev weeks.  rec Prilosec Take 30-60 min before first meal of the day and Zantac 150 mg one at bedtime Stop lisinopril and start benicar 20 mg one daily (ok to take one half if too strong) - you should be able to taper off your cough medication soon  Stop advair and spiriva Dulera 100 Take 2 puffs first thing in am and then another 2 puffs about 12 hours later.  Only use your albuterol as a rescue medication  .  Please schedule a follow up office visit in 4 weeks, sooner if needed with pfts and cxr> did not return                      02/03/2013 f/u ov/[name] last smoked 01/29/13 but on 02 24h 2.5 on 40 mg pred per day  Chief Complaint  Patient presents with  . Follow-up    Pt states dxed with PNA and MRSA recently and was d/c'ed from [facility] on 01/20/13. She c/o having increased SOB, chest tightness and prod cough with very minimal yellow sputum.   using neb every 4 hours due to cough and sob with min acitvty but not much response rec Plan A Dulera 100 Take 2 puffs first thing in am and then another 2 puffs about 12 hours later  Prilosec 40 mg Take 30-60 min before first meal of the day and Pepcid 20 mg one at bedtime  For breathing  Plan B Only  use your albuterol as a rescue medication   Plan C is your nebulizer ok to use nebulizer up to every 4 hours but my hope is you won't need it at all For cough mucinex dm 1200 mg every 12 hours supplement with percocet up to every 4 hours if needed plus the flutter valve GERD  diet See Tammy NP w/in 2 weeks with all your medications > did not return     02/27/2014 acute /extended re-establish  ov/[name] re: AB/ still smoking  Chief Complaint  Patient presents with  . Acute Visit    Pt c/o increased cough- non prod, SOB, wheezing, and chest tighness on and off for the past 2-3 months. Using albuterol neb at least once per day.   day > noct hacking cough non productive   >Prilosec 40 mg Take 30-60 min before first meal of the day and Pepcid 20 mg one at bedtime Stop flonase and clariton and dulera  For allergies, For drainage take chlortrimeton (chlorpheniramine) 4 mg every 4 hours available over the counter (may cause drowsiness)   For cough mucinex dm 1200 mg every 12 hours supplement with percocet up to every 4 hours  if needed plus the flutter valve as much as possible  For breathing >> Only use your albuterol neb as a rescue medication  GERD  Diet       03/05/2014 Follow up and Medication Review  AB/ still smoking  Returns for 2 week follow up .  We reviewed all her medications organize them into a medication count with patient education. Patient has a recent change of her insurance and several medications are not currently being covered.  She has an office visit with her primary care physician next week and plans to discuss this. Last visit. Patient was started on aggressive treatment for cough, reflux and postnasal drip. Last visit was instructed to stop her Dulera and claritin.  She is using Percocet for cough control She says that her cough is much improved She did not start Chlor-Trimeton as she still not find this at the store. Follow med calendar and bring to each  ov   03/13/2014 f/u ov/[name] re: still smoking  With  severe cough x 3 y/ no med calendar, changes made by Dr Harrington Challenger in [place] / no flutter on hand  Chief Complaint  Patient presents with  . Follow-up    Pt states that her breathing is slightly better "still get winded doing anything".  Has not had to use the neb at all since last visit. Cough is unchanged since last visit.    harsh dry barking day > noct not using flutter, no worse since ran out of opioids  rec Stop losartan Start bystolic 5 mg one daily  For drainage take chlortrimeton (chlorpheniramine) 4 mg every 4 hours available over the counter (may cause drowsiness)  For cough use the delsym 2 tsp every 12 hours and the flutter valve as much as possible  Admit 12/2-8/15 with ae AB  04/08/2014 post f/u ov/[name] re: AB/ refractory cough/ still smoking  Chief Complaint  Patient presents with  . HFU    Pt states discharged from [facility] on 03/31/14 after having COPD exacerbation. She states that she felt like she had started to improve after d/c'ed, but c/o sore throat, chest tightness, increased SOB and cough x 2-3 days. She states that cough is prod, mainly in the am with large amounts of white to yellow sputum.   prednisone stopped one day prior to OV  But but noted first became worse on < 20 mg with cough and sob Did bring calendar, not all meds have been refilled on it though, missing opioids/daliresp/lyrica "I'm getting them filled" saba not helping, not using neb rec The key is to stop smoking completely before smoking completely stops you!  See calendar for specific medication instructions    10/17/2019  Re-establish/ ov/[name] re:   COPD GOLD 0/ still smoking / not vaccinated Chief Complaint  Patient presents with  . Follow-up    PFT done today. Breathing worse since the last visit- relates to hot weather. She is coughing up yellow sputum in the am's and retaining fluid in hands and feet. She is using her albuterol inhaler 1 x  per wk and rarely uses neb.   Dyspnea:   MMRC2 = can't walk a nl pace on a flat grade s sob but does fine slow and flat  Cough: esp  In am / rattling congested sounding cough with minimal mucoid sputum  Sleeping:   flat bed 3 pillows /doing fine on cpap per  [facility]  SABA use: rarely  02: none     No obvious day  to day or daytime variability or assoc  purulent sputum or mucus plugs or hemoptysis or cp or chest tightness, subjective wheeze or overt sinus or hb symptoms.   Sleeping  without nocturnal  or early am exacerbation  of respiratory  c/o's or need for noct saba. Also denies any obvious fluctuation of symptoms with weather or environmental changes or other aggravating or alleviating factors except as outlined above   No unusual exposure hx or h/o childhood pna/ asthma or knowledge of premature birth.  Current Allergies, Complete Past Medical History, Past Surgical History, Family History, and Social History were reviewed in [employer] record.  ROS  The following are not active complaints unless bolded Hoarseness, sore throat, dysphagia, dental problems, itching, sneezing,  nasal congestion or discharge of excess mucus or purulent secretions, ear ache,   fever, chills, sweats, unintended wt loss or wt gain, classically pleuritic or exertional cp,  orthopnea pnd or arm/hand swelling  or leg swelling, presyncope, palpitations, abdominal pain, anorexia, nausea, vomiting, diarrhea  or change in bowel habits or change in bladder habits, change in stools or change in urine, dysuria, hematuria,  rash, arthralgias, visual complaints, headache, numbness, weakness or ataxia or problems with walking or coordination,  change in mood or  memory.        Current Meds  Medication Sig  . ACCU-CHEK GUIDE test strip USE UP TO FOUR TIMES DAILY AS DIRECTED  . Accu-Chek Softclix Lancets lancets USE UP TO FOUR TIMES DAILY AS DIRECTED  . albuterol (PROVENTIL) (2.5 MG/3ML) 0.083% nebulizer  solution Take 3 mLs (2.5 mg total) by nebulization every 6 (six) hours as needed for wheezing or shortness of breath.  [name] albuterol (VENTOLIN HFA) 108 (90 Base) MCG/ACT inhaler Inhale 2 puffs into the lungs every 6 (six) hours as needed for wheezing or shortness of breath.  . amphetamine-dextroamphetamine (ADDERALL) 30 MG tablet Take 1 tablet by mouth 2 (two) times daily.  [name] aspirin EC 81 MG tablet Take 1 tablet (81 mg total) by mouth daily.  [name] atorvastatin (LIPITOR) 40 MG tablet Take 1 tablet (40 mg total) by mouth daily.  . blood glucose meter kit and supplies Dispense based on patient and insurance preference. Use up to four times daily as directed. (FOR ICD-10 E10.9, E11.9).  [name] Cholecalciferol (VITAMIN D-3) 125 MCG (5000 UT) TABS Take 5,000 Units by mouth daily.  . ciprofloxacin-dexamethasone (CIPRODEX) OTIC suspension Place 4-5 drops into the right ear 2 (two) times daily as needed (drainage).  . cycloSPORINE (RESTASIS) 0.05 % ophthalmic emulsion Place 1 drop into both eyes 2 (two) times daily.  . DULoxetine (CYMBALTA) 60 MG capsule TAKE 1 CAPSULE(60 MG) BY MOUTH AT BEDTIME (Patient taking differently: Take 60 mg by mouth at bedtime. TAKE 1 CAPSULE(60 MG) BY MOUTH AT BEDTIME)  . ezetimibe (ZETIA) 10 MG tablet Take 1 tablet (10 mg total) by mouth daily.  . fluticasone (FLONASE) 50 MCG/ACT nasal spray USE 1 SPRAY IN EACH NOSTRIL TWICE DAILY AFTER SINUS RINSES (Patient taking differently: Place 1 spray into both nostrils in the morning and at bedtime. USE 1 SPRAY IN EACH NOSTRIL TWICE DAILY AFTER SINUS RINSES)  . furosemide (LASIX) 40 MG tablet Take 1 tablet (40 mg total) by mouth daily.  [name] ibuprofen (ADVIL) 200 MG tablet Take 200 mg by mouth every 6 (six) hours as needed.  . Insulin Pen Needle (BD PEN NEEDLE NANO U/F) 32G X 4 MM MISC USE AS DIRECTED WITH VICTOZA  . INVOKANA 300 MG TABS  tablet TAKE 1 TABLET(300 MG) BY MOUTH DAILY BEFORE BREAKFAST (Patient taking differently: Take 300 mg by mouth  daily before breakfast. )  . isosorbide mononitrate (IMDUR) 30 MG 24 hr tablet TAKE 1 TABLET BY MOUTH EVERY DAY (Patient taking differently: Take 30 mg by mouth daily. )  . Lancets Misc. (ACCU-CHEK SOFTCLIX LANCET DEV) KIT Check Blood sugar up to 4 times a day  . liraglutide (VICTOZA) 18 MG/3ML SOPN INJECT 0.3 MLS (1.8 MG TOTAL) INTO THE SKIN DAILY. (Patient taking differently: Inject 1.8 mg into the skin daily. INJECT 0.3 MLS (1.8 MG TOTAL) INTO THE SKIN DAILY.)  . losartan (COZAAR) 25 MG tablet Take 1 tablet (25 mg total) by mouth daily.  . metoprolol tartrate (LOPRESSOR) 25 MG tablet Take 1 tablet (25 mg total) by mouth 2 (two) times daily.  . Multiple Vitamins-Minerals (MULTIVITAMIN WITH MINERALS) tablet Take 1 tablet by mouth daily.  . nitroGLYCERIN (NITROSTAT) 0.4 MG SL tablet Place 1 tablet (0.4 mg total) under the tongue every 5 (five) minutes as needed for chest pain.  . pantoprazole (PROTONIX) 40 MG tablet Take 1 tablet (40 mg total) by mouth daily. 30 minutes before a meal.  . pregabalin (LYRICA) 150 MG capsule TAKE 1 CAPSULE BY MOUTH TWICE DAILY  . VITAMIN D PO Take 1 tablet by mouth daily.               Objective:   Physical Exam   10/17/2019      266 10/17/19 266 lb (120.7 kg)  09/29/19 264 lb 12.8 oz (120.1 kg)  09/08/19 264 lb (119.7 kg)  03/18/2012 wt  265     Pleasant amb obese wf nad with typical smoker's rattle    Vital signs reviewed  10/17/2019  - Note at rest 02 sats  95% on  RA    HEENT : pt wearing mask not removed for exam due to covid -19 concerns.    NECK :  without JVD/Nodes/TM/ nl carotid upstrokes bilaterally   LUNGS: no acc muscle use,  Nl contour chest with min coarse rhonchi  bilaterally without cough on insp or exp maneuvers   CV:  RRR  no s3 or murmur or increase in P2, and no edema   ABD:  soft and nontender with nl inspiratory excursion in the supine position. No bruits or organomegaly appreciated, bowel sounds nl  MS:  Nl gait/ ext warm  without deformities, calf tenderness, cyanosis or clubbing No obvious joint restrictions   SKIN: warm and dry without lesions    NEURO:  alert, approp, nl sensorium with  no motor or cerebellar deficits apparent.        I personally reviewed images and agree with radiology impression as follows:  CXR:   03/13/2019   Interval increase in irregular opacity of the lingula, which may reflect atelectasis or infection..                Assessment & Plan:

## 2019-10-17 NOTE — Progress Notes (Signed)
PFT done today. 

## 2019-10-18 ENCOUNTER — Encounter: Payer: Self-pay | Admitting: Internal Medicine

## 2019-10-18 DIAGNOSIS — R9389 Abnormal findings on diagnostic imaging of other specified body structures: Secondary | ICD-10-CM | POA: Insufficient documentation

## 2019-10-18 NOTE — Assessment & Plan Note (Signed)
Body mass index is 45.66 kg/m.   Lab Results  Component Value Date   TSH 1.860 06/30/2019     Contributing to gerd risk/ doe/OSA/ reviewed the need and the process to achieve and maintain neg calorie balance > defer f/u primary care including intermittently monitoring thyroid status

## 2019-10-18 NOTE — Assessment & Plan Note (Signed)
On CPAP, followed by pulmonary/Alva   Etiology and pathophysiology of osa including relationship to obesity reviewed in detail  > referred back to [facility]/ f/u with me is prn

## 2019-10-18 NOTE — Assessment & Plan Note (Signed)
Active smoker  - PFT's 10/17/2019  wnl s rx prior despite rattling cough and mild rhonchi on exam     I reviewed the Fletcher curve with the patient that basically indicates  if you quit smoking when your best day FEV1 is still well preserved (as is clearly  the case here)  it is highly unlikely you will progress to severe disease and informed the patient there was  no medication on the market that has proven to alter the curve/ its downward trajectory  or the likelihood of progression of their disease(unlike other chronic medical conditions such as atheroclerosis where we do think we can change the natural hx with risk reducing meds)    Therefore stopping smoking and maintaining abstinence are  the most important aspects of care, not choice of inhalers or for that matter, doctors.   Treatment other than smoking cessation  is entirely directed by severity of symptoms and focused also on reducing exacerbations, not attempting to change the natural history of the disease.   As she is not having aecopd and has nl lung function all she needs for now is prn saba and work on stopping smoking   Also: Pt informed of the seriousness of COVID 19 infection as a direct Pt informed of the seriousness of COVID 19 infection as a direct risk to lung health  and safey and to close contacts and should continue to wear a facemask in public and minimize exposure to public locations but especially avoid any area or activity where non-close contacts are not observing distancing or wearing an appropriate face mask.  I strongly recommended she take either of the vaccines available through local drugstores based on updated information on millions of Americans treated with the Vina products  which have proven both safe and  effective even against the new delta variant.

## 2019-10-18 NOTE — Assessment & Plan Note (Addendum)
See cxr  11/ 15/2020 ? Developing lingular syndrome    >>> rec f/u cxr but most likely this relates to obvious mucociliary dysfuction/ mucus plugging related to ongoing smoking

## 2019-10-18 NOTE — Assessment & Plan Note (Signed)
Counseled re importance of smoking cessation but did not meet time criteria for separate billing            Each maintenance medication was reviewed in detail including emphasizing most importantly the difference between maintenance and prns and under what circumstances the prns are to be triggered using an action plan format where appropriate.  Total time for H and P, chart review, counseling, teaching device and generating customized AVS unique to this office visit for pt not seen in > 4 y/ charting = 30 min

## 2019-10-21 ENCOUNTER — Other Ambulatory Visit: Payer: Self-pay

## 2019-10-21 ENCOUNTER — Ambulatory Visit (HOSPITAL_COMMUNITY): Payer: Medicare HMO | Attending: Cardiovascular Disease

## 2019-10-21 ENCOUNTER — Ambulatory Visit (INDEPENDENT_AMBULATORY_CARE_PROVIDER_SITE_OTHER): Payer: Medicare HMO

## 2019-10-21 ENCOUNTER — Telehealth: Payer: Self-pay | Admitting: *Deleted

## 2019-10-21 DIAGNOSIS — J449 Chronic obstructive pulmonary disease, unspecified: Secondary | ICD-10-CM

## 2019-10-21 DIAGNOSIS — R0602 Shortness of breath: Secondary | ICD-10-CM | POA: Insufficient documentation

## 2019-10-21 NOTE — Telephone Encounter (Signed)
Spoke with the pt and notified of recs and she verbalized understanding  Order for cxr placed

## 2019-10-21 NOTE — Telephone Encounter (Signed)
-----   Message from Tanda Rockers, MD sent at 10/18/2019  1:16 PM EDT ----- After review of chart, needs cxr  to complete the w/u - can do this at f/u with alva if schedules in next 3 months and if not have her drop by and I'll call her with results

## 2019-10-22 NOTE — Progress Notes (Signed)
Spoke with pt and notified of results per Dr. Wert. Pt verbalized understanding and denied any questions. 

## 2019-10-27 ENCOUNTER — Other Ambulatory Visit: Payer: Self-pay | Admitting: Family Medicine

## 2019-10-27 DIAGNOSIS — K219 Gastro-esophageal reflux disease without esophagitis: Secondary | ICD-10-CM

## 2019-10-29 ENCOUNTER — Ambulatory Visit: Payer: Medicare HMO | Admitting: Cardiology

## 2019-10-29 ENCOUNTER — Other Ambulatory Visit: Payer: Self-pay

## 2019-10-29 ENCOUNTER — Encounter: Payer: Self-pay | Admitting: Cardiology

## 2019-10-29 VITALS — BP 117/78 | HR 92 | Temp 97.0°F | Ht 64.0 in | Wt 264.4 lb

## 2019-10-29 DIAGNOSIS — E785 Hyperlipidemia, unspecified: Secondary | ICD-10-CM | POA: Diagnosis not present

## 2019-10-29 DIAGNOSIS — I10 Essential (primary) hypertension: Secondary | ICD-10-CM | POA: Diagnosis not present

## 2019-10-29 DIAGNOSIS — Z720 Tobacco use: Secondary | ICD-10-CM

## 2019-10-29 DIAGNOSIS — Z01810 Encounter for preprocedural cardiovascular examination: Secondary | ICD-10-CM

## 2019-10-29 DIAGNOSIS — I2510 Atherosclerotic heart disease of native coronary artery without angina pectoris: Secondary | ICD-10-CM

## 2019-10-29 DIAGNOSIS — Z01818 Encounter for other preprocedural examination: Secondary | ICD-10-CM

## 2019-10-29 MED ORDER — FUROSEMIDE 40 MG PO TABS
ORAL_TABLET | ORAL | 3 refills | Status: DC
Start: 2019-10-29 — End: 2022-06-13

## 2019-10-29 NOTE — Progress Notes (Signed)
Cardiology Office Note:    Date:  10/29/2019   ID:  [name], DOB Jul 11, 1965, MRN [id number]  PCP:  Ronnald Nian, DO  Cardiologist:  Sinclair Grooms, MD  Electrophysiologist:  None   Referring MD: Ronnald Nian, DO    Chief Complaint  Patient presents with  . Chest Pain   History of Present Illness:    [name] is a 54 y.o. female with a hx of COPD, OSA, type 2 diabetes, obesity, hyperlipidemia, hypertension, anxiety and depression who presents for follow-up.  She was referred by Dr. Lucia Gaskins for preop evaluation prior to tympanoplasty, initially seen on 09/03/2019.  She has had repeated ear infections, and tympanoplasty planned.  She had a work-up for chest pain in 2019.  Underwent coronary CTA on 12/04/2017 which showed calcium score 411 (99th percentile) mild to moderate diffuse CAD.  Technically difficult study given morbid obesity and high heart rates despite metoprolol, Cardizem, and Xanax.  CT FFR was sent which showed focal severe stenosis (CT FFR 0.78) in the distal RCA prior to the bifurcation of the PDA and PLA.  TTE on 10/06/2017 showed LVEF 55 to 70%, grade 1 diastolic dysfunction, no significant valvular disease, normal RV function.  Patient reports that she did not follow-up with cardiology following her coronary CTA.  States that she has continued to have exertional chest pain.  Reports that exertion such as walking up a flight of stairs will cause her to have left-sided, sharp pain that resolves with rest.  Given her coronary CTA findings and ongoing chest pain, cardiac catheterization was done on 09/08/2019, which showed nonobstructive CAD (proximal to mid LAD 25%, proximal to mid circumflex 20%, proximal RCA 30%, distal RCA 30%), normal LV systolic function, LVEDP 22 mmHg.  Echocardiogram on 10/21/2019 shows normal biventricular function, no significant valvular disease, normal diastolic function  Reports that she has been having lethargy and  gastrointestinal issues on atorvastatin 80 mg daily, was decreased to 40 mg daily and symptoms resolved. Family history includes sister recently died of MI at age 14.  Mother died of CHF.  Patient reports that she continues to smoke 1ppd.  Has smoked for 40 years.  Also smokes marijuana.    Since last clinic visit, she reports that she is doing better.  States that chest pain has improved.  Dyspnea has also improved.  States that the edema improved with starting Lasix, and the weight has been stable.    Wt Readings from Last 3 Encounters:  10/29/19 264 lb 6.4 oz (119.9 kg)  10/17/19 266 lb (120.7 kg)  09/29/19 264 lb 12.8 oz (120.1 kg)      Past Medical History:  Diagnosis Date  . ADHD   . Alcohol abuse   . Anemia   . Anxiety   . Asthmatic bronchitis    normal PFT/ seen by pulmonary no evidence of COPD  . Atypical chest pain 10/05/2017  . Back pain   . Bilateral swelling of feet   . Chest pain   . Chest pain on respiration 03/25/2014  . Chronic bronchitis ([place])   . Chronic lower back pain   . Chronic respiratory failure ([place]) 10/16/2011   Newly 02 dep 24/7 p discharge from [facility] 01/2013  - 03/13/2014  Walked RA  2 laps @ 185 ft each stopped due to  Sob/ aching in legs, thirsty/ no desat @ slow pace   . Chronic respiratory failure with hypoxia (HCC)    On 2-3 L of oxygen  at home  . Cigarette smoker 12/02/2010   Followed in Pulmonary clinic/ [facility]/ [place]   - Limits of effective care reviewed 12/22/2011    . Complication of anesthesia   . Constipation   . COPD (chronic obstructive pulmonary disease) ([place])   . COPD with chronic bronchitis ([place]) 10/01/2017  . Daily headache   . Depression   . Diabetic peripheral neuropathy ([place])   . DM (diabetes mellitus) type II controlled, neurological manifestation ([place]) 12/02/2010  . Drug use   . Gallbladder problem   . Gastric erosions    EGD 08/2010.  [name] GERD (gastroesophageal reflux disease)   . Glaucoma   . H/O drug abuse ([place])  11/12/2017   -- scanned document from outside source: [facility] in [place] which showed UDS positive for Adderall /amphetamine usage as well as positive urine for THC.  This test result was collected 08/11/2016 and reported 10/07/2016. - lso review of the chart shows another positive amphetamine, THC and METH in the urine back on 10/18/2015 under "care everywhere". ---So   . Heavy menses   . High cholesterol   . History of blood transfusion    "related to low HgB" (10/05/2017)  . History of hiatal hernia   . HTN (hypertension)   . Hyperlipidemia associated with type 2 diabetes mellitus ([place]) 10/18/2015  . Hypertension associated with diabetes ([place]) 12/02/2010   D/c acei 12/22/2011 due to psuedowheeze and narcotic dependent cough> ? Improved - 02/63/7858 started bystolic in place of cozar due to cough    . IBS (irritable bowel syndrome)   . Increased urinary protein excretion   . Internal hemorrhoids    Colonoscopy 5/12.  . Iron deficiency anemia 10/01/2017  . Joint pain   . Mixed diabetic hyperlipidemia associated with type 2 diabetes mellitus ([place]) 10/01/2017  . On home oxygen therapy    "5L at night" (10/05/2017)  . OSA on CPAP   . Osteoarthritis    "back" (10/05/2017)  . Oxygen dependent 10/16/2011  . Pneumonia    "lots of times" (10/05/2017)  . PONV (postoperative nausea and vomiting)   . Poorly controlled diabetes mellitus ([place]) 11/12/2017  . Pulmonary infiltrates 12/22/2011   Followed in Pulmonary clinic/ [facility]/ [place]    - See CT Chest 05/05/11   . Shortness of breath   . Tachycardia    never had test done since no insurance  . Tobacco use disorder-current smoker greater than 40-pack-year history- since age 15 1 ppd 10/01/2017  . Type II diabetes mellitus ([place])   . Vitamin D deficiency     Past Surgical History:  Procedure Laterality Date  . CESAREAN SECTION  1988; 1989  . CHOLECYSTECTOMY OPEN  1990  . COLONOSCOPY  09/16/2010    IFO:YDXAJO colon/small internal hemorrhoids  . ESOPHAGOGASTRODUODENOSCOPY  09/16/2010   SLF: normal/mild gastritis  . ESOPHAGOGASTRODUODENOSCOPY N/A 10/19/2014   Procedure: ESOPHAGOGASTRODUODENOSCOPY (EGD);  Surgeon: Danie Binder, MD;  Location: [facility];  Service: Endoscopy;  Laterality: N/A;  830  . FRACTURE SURGERY    . HYSTEROSCOPY WITH THERMACHOICE  01/17/2012   Procedure: HYSTEROSCOPY WITH THERMACHOICE;  Surgeon: Florian Buff, MD;  Location: [facility] ORS;  Service: Gynecology;  Laterality: N/A;  total therapy time: 9:13sec  D5W  18 ml in, D5W   2m out, temperture 87degrees celcious  . KIDNEY SURGERY     as child for blockages  . LEFT HEART CATH AND CORONARY ANGIOGRAPHY N/A 09/08/2019   Procedure: LEFT  HEART CATH AND CORONARY ANGIOGRAPHY;  Surgeon: Martinique, Peter M, MD;  Location: [place] [facility];  Service: Cardiovascular;  Laterality: N/A;  . TONSILLECTOMY    . TUBAL LIGATION  1989  . TYMPANOSTOMY TUBE PLACEMENT Bilateral    "several times when I was a child"  . uterine ablation    . WRIST FRACTURE SURGERY Left 1995    Current Medications: Current Meds  Medication Sig  . ACCU-CHEK GUIDE test strip USE UP TO FOUR TIMES DAILY AS DIRECTED  . Accu-Chek Softclix Lancets lancets USE UP TO FOUR TIMES DAILY AS DIRECTED  . albuterol (VENTOLIN HFA) 108 (90 Base) MCG/ACT inhaler Inhale 2 puffs into the lungs every 6 (six) hours as needed for wheezing or shortness of breath.  . amphetamine-dextroamphetamine (ADDERALL) 30 MG tablet Take 1 tablet by mouth 2 (two) times daily.  [name] aspirin EC 81 MG tablet Take 1 tablet (81 mg total) by mouth daily.  [name] atorvastatin (LIPITOR) 40 MG tablet Take 1 tablet (40 mg total) by mouth daily.  . blood glucose meter kit and supplies Dispense based on patient and insurance preference. Use up to four times daily as directed. (FOR ICD-10 E10.9, E11.9).  [name] Cholecalciferol (VITAMIN D-3) 125 MCG (5000 UT) TABS Take 5,000 Units by mouth daily.  .  ciprofloxacin-dexamethasone (CIPRODEX) OTIC suspension Place 4-5 drops into the right ear 2 (two) times daily as needed (drainage).  . cycloSPORINE (RESTASIS) 0.05 % ophthalmic emulsion Place 1 drop into both eyes 2 (two) times daily.  . DULoxetine (CYMBALTA) 60 MG capsule TAKE 1 CAPSULE(60 MG) BY MOUTH AT BEDTIME (Patient taking differently: Take 60 mg by mouth at bedtime. TAKE 1 CAPSULE(60 MG) BY MOUTH AT BEDTIME)  . ezetimibe (ZETIA) 10 MG tablet Take 1 tablet (10 mg total) by mouth daily.  . fluticasone (FLONASE) 50 MCG/ACT nasal spray USE 1 SPRAY IN EACH NOSTRIL TWICE DAILY AFTER SINUS RINSES (Patient taking differently: Place 1 spray into both nostrils in the morning and at bedtime. USE 1 SPRAY IN EACH NOSTRIL TWICE DAILY AFTER SINUS RINSES)  . furosemide (LASIX) 40 MG tablet AS NEEDED for weight increase of 3 lbs overnight or 5 lbs in 1 week  . ibuprofen (ADVIL) 200 MG tablet Take 200 mg by mouth every 6 (six) hours as needed.  . Insulin Pen Needle (BD PEN NEEDLE NANO U/F) 32G X 4 MM MISC USE AS DIRECTED WITH VICTOZA  . INVOKANA 300 MG TABS tablet TAKE 1 TABLET(300 MG) BY MOUTH DAILY BEFORE BREAKFAST (Patient taking differently: Take 300 mg by mouth daily before breakfast. )  . isosorbide mononitrate (IMDUR) 30 MG 24 hr tablet TAKE 1 TABLET BY MOUTH EVERY DAY (Patient taking differently: Take 30 mg by mouth daily. )  . Lancets Misc. (ACCU-CHEK SOFTCLIX LANCET DEV) KIT Check Blood sugar up to 4 times a day  . liraglutide (VICTOZA) 18 MG/3ML SOPN INJECT 0.3 MLS (1.8 MG TOTAL) INTO THE SKIN DAILY. (Patient taking differently: Inject 1.8 mg into the skin daily. INJECT 0.3 MLS (1.8 MG TOTAL) INTO THE SKIN DAILY.)  . losartan (COZAAR) 25 MG tablet Take 1 tablet (25 mg total) by mouth daily.  . metoprolol tartrate (LOPRESSOR) 25 MG tablet Take 1 tablet (25 mg total) by mouth 2 (two) times daily.  . Multiple Vitamins-Minerals (MULTIVITAMIN WITH MINERALS) tablet Take 1 tablet by mouth daily.  .  nitroGLYCERIN (NITROSTAT) 0.4 MG SL tablet Place 1 tablet (0.4 mg total) under the tongue every 5 (five) minutes as needed for chest pain.  [name]  pregabalin (LYRICA) 150 MG capsule TAKE 1 CAPSULE BY MOUTH TWICE DAILY  . VITAMIN D PO Take 1 tablet by mouth daily.  . [DISCONTINUED] furosemide (LASIX) 40 MG tablet Take 1 tablet (40 mg total) by mouth daily.  . [DISCONTINUED] pantoprazole (PROTONIX) 40 MG tablet Take 1 tablet (40 mg total) by mouth daily. 30 minutes before a meal.   Current Facility-Administered Medications for the 10/29/19 encounter (Office Visit) with Donato Heinz, MD  Medication  . sodium chloride flush (NS) 0.9 % injection 3 mL     Allergies:   Codeine, Metformin and related, Wellbutrin [bupropion hcl], and Acyclovir and related   Social History   Socioeconomic History  . Marital status: Married    Spouse name: Not on file  . Number of children: 2  . Years of education: Not on file  . Highest education level: Not on file  Occupational History  . Occupation: unemployed  Tobacco Use  . Smoking status: Current Every Day Smoker    Packs/day: 1.50    Years: 40.00    Pack years: 60.00    Types: Cigarettes    Start date: 42  . Smokeless tobacco: Never Used  Vaping Use  . Vaping Use: Former  Substance and Sexual Activity  . Alcohol use: Yes    Comment: once a month - 3-4 mixed drinks  . Drug use: Yes    Types: Marijuana    Comment: daily use  . Sexual activity: Yes    Partners: Male    Birth control/protection: Surgical  Other Topics Concern  . Not on file  Social History Narrative  . Not on file   Social Determinants of Health   Financial Resource Strain:   . Difficulty of Paying Living Expenses:   Food Insecurity:   . Worried About Charity fundraiser in the Last Year:   . Arboriculturist in the Last Year:   Transportation Needs:   . Film/video editor (Medical):   [name] Lack of Transportation (Non-Medical):   Physical Activity:   . Days of  Exercise per Week:   . Minutes of Exercise per Session:   Stress:   . Feeling of Stress :   Social Connections:   . Frequency of Communication with Friends and Family:   . Frequency of Social Gatherings with Friends and Family:   . Attends Religious Services:   . Active Member of Clubs or Organizations:   . Attends Archivist Meetings:   [name] Marital Status:      Family History: The patient's family history includes Alcohol abuse in her father; Anxiety disorder in her sister; Breast cancer in her mother; Cancer in her mother; Depression in her father, mother, and sister; Diabetes in her mother; Heart attack in her maternal grandmother; Heart attack (age of onset: 58) in her father; Heart attack (age of onset: 90) in her mother; Heart disease in her father and mother; Heart failure in her father, mother, and sister; Hyperlipidemia in her mother; Hypertension in her mother and sister; Liver disease (age of onset: 64) in her maternal aunt; Obesity in her mother; Sleep apnea in her mother; Sudden death in her father and mother; Ulcers in her sister. There is no history of Colon cancer.  ROS:   Please see the history of present illness.    All other systems reviewed and are negative.  EKGs/Labs/Other Studies Reviewed:    The following studies were reviewed today:   EKG:  EKG is ordered  today.  The ekg ordered today demonstrates normal sinus rhythm, rate 75, no ST/T abnormality  Cath 09/08/19:  Prox LAD to Mid LAD lesion is 25% stenosed.  Prox Cx to Mid Cx lesion is 20% stenosed.  Prox RCA lesion is 30% stenosed.  Dist RCA lesion is 30% stenosed.  The left ventricular systolic function is normal.  LV end diastolic pressure is moderately elevated.  The left ventricular ejection fraction is 55-65% by visual estimate.   1. Nonobstructive CAD 2. Normal LV function 3. Moderately elevated LVEDP 22 mm Hg  Plan: medical management and risk factor modification.  Recent  Labs: 06/30/2019: TSH 1.860 08/15/2019: ALT 16 09/03/2019: Hemoglobin 13.8; Platelets 284 10/09/2019: BUN 11; Creatinine, Ser 0.73; Magnesium 2.2; Potassium 4.6; Sodium 140  Recent Lipid Panel    Component Value Date/Time   CHOL 151 06/30/2019 1345   TRIG 98 06/30/2019 1345   HDL 48 06/30/2019 1345   CHOLHDL 3.4 02/12/2018 0830   CHOLHDL 7.7 10/06/2017 0318   VLDL 35 10/06/2017 0318   LDLCALC 85 06/30/2019 1345   LDLDIRECT 121 (H) 10/03/2013 1655    Physical Exam:    VS:  BP 117/78   Pulse 92   Temp (!) 97 F (36.1 C)   Ht _0  (1.626 m)   Wt 264 lb 6.4 oz (119.9 kg)   LMP 08/01/2014 Comment: irregular  SpO2 97%   BMI 45.38 kg/m     Wt Readings from Last 3 Encounters:  10/29/19 264 lb 6.4 oz (119.9 kg)  10/17/19 266 lb (120.7 kg)  09/29/19 264 lb 12.8 oz (120.1 kg)     GEN: in no acute distress HEENT: Normal NECK: No JVD; No carotid bruits LYMPHATICS: No lymphadenopathy CARDIAC: RRR, no murmurs, rubs, gallops RESPIRATORY:  Clear to auscultation without rales, wheezing or rhonchi  ABDOMEN: Soft, non-tender, non-distended MUSCULOSKELETAL:  No edema SKIN: Warm and dry NEUROLOGIC:  Alert and oriented x 3 PSYCHIATRIC:  Normal affect   ASSESSMENT:    1. CAD in native artery   2. Essential hypertension   3. Pre-op evaluation   4. Hyperlipidemia, unspecified hyperlipidemia type   5. Tobacco use    PLAN:     CAD: Coronary CTA 2019 showed calcium score 411 (99th percentile) with diffuse disease.  CT FFR showed focal severe stenosis in the distal RCA prior to bifurcation of PDA and PLA (CT FFR 0.78).  She did not follow-up after her CTA.  She is having typical angina.  Cardiac catheterization was done on 09/08/2019, which showed nonobstructive CAD (proximal to mid LAD 25%, proximal to mid circumflex 20%, proximal RCA 30%, distal RCA 30%), normal LV systolic function, LVEDP 22 mmHg.  Echocardiogram on 10/21/2019 shows normal biventricular function, no significant valvular  disease, normal diastolic function -Continue aspirin 81 mg daily -Continue atorvastatin 40 mg daily.  LDL above goal less than 70 but patient did not tolerate atorvastatin 80 mg daily.  Added Zetia 10 mg daily -Given typical angina and nonobstructive CAD on cath, suspect microvascular disease.  Continue Imdur 30 mg daily and metoprolol 25 mg twice daily -Appears improved on Lasix.  Will check BMP/magnesium.  Will switch Lasix to as needed, recommend taking Lasix 40 mg daily if gaining more than 3 pounds in a day or 5 pounds in a week  Preop evaluation: Prior to tympanoplasty.  Cardiac catheterization with nonobstructive CAD.  TTE unremarkable.  No further cardiac work-up recommended prior to surgery.  Hypertension: On losartan 25 mg daily, Imdur 30 mg daily,  and metoprolol 25 mg twice daily.  Appears controlled  Hyperlipidemia: On atorvastatin 40 mg daily.  LDL 85 06/30/19, above goal less than 70.  Did not tolerate atorvastatin 80 mg daily.  Added Zetia 10 mg daily  Type 2 diabetes: A1c 7.6 06/30/19  Tobacco use: Patient counseled on the risk of tobacco use and cessation strongly encouraged.  RTC in 3 months  Medication Adjustments/Labs and Tests Ordered: Current medicines are reviewed at length with the patient today.  Concerns regarding medicines are outlined above.  Orders Placed This Encounter  Procedures  . Basic metabolic panel  . Magnesium   Meds ordered this encounter  Medications  . furosemide (LASIX) 40 MG tablet    Sig: AS NEEDED for weight increase of 3 lbs overnight or 5 lbs in 1 week    Dispense:  90 tablet    Refill:  3    Patient Instructions  Medication Instructions:  Change Lasix to AS NEEDED for weight increase of 3 lbs overnight or 5 lbs in 1 week  *If you need a refill on your cardiac medications before your next appointment, please call your pharmacy*   Lab Work: BMET, Mag today  If you have labs (blood work) drawn today and your tests are completely  normal, you will receive your results only by: [name] MyChart Message (if you have MyChart) OR . A paper copy in the mail If you have any lab test that is abnormal or we need to change your treatment, we will call you to review the results.  Follow-Up: At [facility], you and your health needs are our priority.  As part of our continuing mission to provide you with exceptional heart care, we have created designated Provider Care Teams.  These Care Teams include your primary Cardiologist (physician) and Advanced Practice Providers (APPs -  Physician Assistants and Nurse Practitioners) who all work together to provide you with the care you need, when you need it.  We recommend signing up for the patient portal called "MyChart".  Sign up information is provided on this After Visit Summary.  MyChart is used to connect with patients for Virtual Visits (Telemedicine).  Patients are able to view lab/test results, encounter notes, upcoming appointments, etc.  Non-urgent messages can be sent to your provider as well.   To learn more about what you can do with MyChart, go to [email or web address].    Your next appointment:   3 month(s)  The format for your next appointment:   In Person  Provider:   Oswaldo Milian, MD       Signed, Donato Heinz, MD  10/29/2019 10:31 PM    [place]

## 2019-10-29 NOTE — Telephone Encounter (Signed)
Last OV 06/20/19 Last fill 11/21/18

## 2019-10-29 NOTE — Patient Instructions (Signed)
Medication Instructions:  Change Lasix to AS NEEDED for weight increase of 3 lbs overnight or 5 lbs in 1 week  *If you need a refill on your cardiac medications before your next appointment, please call your pharmacy*   Lab Work: BMET, Mag today  If you have labs (blood work) drawn today and your tests are completely normal, you will receive your results only by: [name] MyChart Message (if you have MyChart) OR . A paper copy in the mail If you have any lab test that is abnormal or we need to change your treatment, we will call you to review the results.  Follow-Up: At [facility], you and your health needs are our priority.  As part of our continuing mission to provide you with exceptional heart care, we have created designated Provider Care Teams.  These Care Teams include your primary Cardiologist (physician) and Advanced Practice Providers (APPs -  Physician Assistants and Nurse Practitioners) who all work together to provide you with the care you need, when you need it.  We recommend signing up for the patient portal called "MyChart".  Sign up information is provided on this After Visit Summary.  MyChart is used to connect with patients for Virtual Visits (Telemedicine).  Patients are able to view lab/test results, encounter notes, upcoming appointments, etc.  Non-urgent messages can be sent to your provider as well.   To learn more about what you can do with MyChart, go to [email or web address].    Your next appointment:   3 month(s)  The format for your next appointment:   In Person  Provider:   Oswaldo Milian, MD

## 2019-10-30 ENCOUNTER — Ambulatory Visit (INDEPENDENT_AMBULATORY_CARE_PROVIDER_SITE_OTHER): Payer: Medicare HMO

## 2019-10-30 VITALS — BP 122/68 | HR 84 | Temp 97.2°F | Resp 16 | Ht 64.0 in | Wt 266.8 lb

## 2019-10-30 DIAGNOSIS — Z1231 Encounter for screening mammogram for malignant neoplasm of breast: Secondary | ICD-10-CM

## 2019-10-30 DIAGNOSIS — Z0000 Encounter for general adult medical examination without abnormal findings: Secondary | ICD-10-CM

## 2019-10-30 LAB — MAGNESIUM: Magnesium: 2 mg/dL (ref 1.6–2.3)

## 2019-10-30 LAB — BASIC METABOLIC PANEL
BUN/Creatinine Ratio: 13 (ref 9–23)
BUN: 11 mg/dL (ref 6–24)
CO2: 29 mmol/L (ref 20–29)
Calcium: 9.6 mg/dL (ref 8.7–10.2)
Chloride: 96 mmol/L (ref 96–106)
Creatinine, Ser: 0.82 mg/dL (ref 0.57–1.00)
GFR calc Af Amer: 94 mL/min/{1.73_m2} (ref >59)
GFR calc non Af Amer: 82 mL/min/{1.73_m2} (ref >59)
Glucose: 161 mg/dL — ABNORMAL HIGH (ref 65–99)
Potassium: 4.1 mmol/L (ref 3.5–5.2)
Sodium: 139 mmol/L (ref 134–144)

## 2019-10-30 NOTE — Patient Instructions (Signed)
[name] , Thank you for taking time to come for your Medicare Wellness Visit. I appreciate your ongoing commitment to your health goals. Please review the following plan we discussed and let me know if I can assist you in the future.   Screening recommendations/referrals: Colonoscopy: Completed 2012- Due 2022 Mammogram: Ordered today. Someone will be calling to schedule Recommended yearly ophthalmology/optometry visit for glaucoma screening and checkup Recommended yearly dental visit for hygiene and checkup  Vaccinations: Influenza vaccine: Due 12/2019 Pneumococcal vaccine: Completed pneumovax-23 in 2013. Discuss with Pulmonologist. Tdap vaccine: Up to date-Due 03/11/2028 Shingles vaccine: Discuss with pharmacy Covid-19: Completed first dose of Pfizer  Advanced directives: Discussed. Information given.  Conditions/risks identified: See problem list  Next appointment: Follow up in one year for your annual wellness visit.   Preventive Care 40-64 Years, Female Preventive care refers to lifestyle choices and visits with your health care provider that can promote health and wellness. What does preventive care include?  A yearly physical exam. This is also called an annual well check.  Dental exams once or twice a year.  Routine eye exams. Ask your health care provider how often you should have your eyes checked.  Personal lifestyle choices, including:  Daily care of your teeth and gums.  Regular physical activity.  Eating a healthy diet.  Avoiding tobacco and drug use.  Limiting alcohol use.  Practicing safe sex.  Taking low-dose aspirin daily starting at age 39.  Taking vitamin and mineral supplements as recommended by your health care provider. What happens during an annual well check? The services and screenings done by your health care provider during your annual well check will depend on your age, overall health, lifestyle risk factors, and family history of  disease. Counseling  Your health care provider may ask you questions about your:  Alcohol use.  Tobacco use.  Drug use.  Emotional well-being.  Home and relationship well-being.  Sexual activity.  Eating habits.  Work and work Statistician.  Method of birth control.  Menstrual cycle.  Pregnancy history. Screening  You may have the following tests or measurements:  Height, weight, and BMI.  Blood pressure.  Lipid and cholesterol levels. These may be checked every 5 years, or more frequently if you are over 43 years old.  Skin check.  Lung cancer screening. You may have this screening every year starting at age 2 if you have a 30-pack-year history of smoking and currently smoke or have quit within the past 15 years.  Fecal occult blood test (FOBT) of the stool. You may have this test every year starting at age 2.  Flexible sigmoidoscopy or colonoscopy. You may have a sigmoidoscopy every 5 years or a colonoscopy every 10 years starting at age 59.  Hepatitis C blood test.  Hepatitis B blood test.  Sexually transmitted disease (STD) testing.  Diabetes screening. This is done by checking your blood sugar (glucose) after you have not eaten for a while (fasting). You may have this done every 1-3 years.  Mammogram. This may be done every 1-2 years. Talk to your health care provider about when you should start having regular mammograms. This may depend on whether you have a family history of breast cancer.  BRCA-related cancer screening. This may be done if you have a family history of breast, ovarian, tubal, or peritoneal cancers.  Pelvic exam and Pap test. This may be done every 3 years starting at age 60. Starting at age 50, this may be done every 5  years if you have a Pap test in combination with an HPV test.  Bone density scan. This is done to screen for osteoporosis. You may have this scan if you are at high risk for osteoporosis. Discuss your test results,  treatment options, and if necessary, the need for more tests with your health care provider. Vaccines  Your health care provider may recommend certain vaccines, such as:  Influenza vaccine. This is recommended every year.  Tetanus, diphtheria, and acellular pertussis (Tdap, Td) vaccine. You may need a Td booster every 10 years.  Zoster vaccine. You may need this after age 18.  Pneumococcal 13-valent conjugate (PCV13) vaccine. You may need this if you have certain conditions and were not previously vaccinated.  Pneumococcal polysaccharide (PPSV23) vaccine. You may need one or two doses if you smoke cigarettes or if you have certain conditions. Talk to your health care provider about which screenings and vaccines you need and how often you need them. This information is not intended to replace advice given to you by your health care provider. Make sure you discuss any questions you have with your health care provider. Document Released: 05/07/2015 Document Revised: 12/29/2015 Document Reviewed: 02/09/2015 Elsevier Interactive Patient Education  2017 [place] Prevention in the Home Falls can cause injuries. They can happen to people of all ages. There are many things you can do to make your home safe and to help prevent falls. What can I do on the outside of my home?  Regularly fix the edges of walkways and driveways and fix any cracks.  Remove anything that might make you trip as you walk through a door, such as a raised step or threshold.  Trim any bushes or trees on the path to your home.  Use bright outdoor lighting.  Clear any walking paths of anything that might make someone trip, such as rocks or tools.  Regularly check to see if handrails are loose or broken. Make sure that both sides of any steps have handrails.  Any raised decks and porches should have guardrails on the edges.  Have any leaves, snow, or ice cleared regularly.  Use sand or salt on walking  paths during winter.  Clean up any spills in your garage right away. This includes oil or grease spills. What can I do in the bathroom?  Use night lights.  Install grab bars by the toilet and in the tub and shower. Do not use towel bars as grab bars.  Use non-skid mats or decals in the tub or shower.  If you need to sit down in the shower, use a plastic, non-slip stool.  Keep the floor dry. Clean up any water that spills on the floor as soon as it happens.  Remove soap buildup in the tub or shower regularly.  Attach bath mats securely with double-sided non-slip rug tape.  Do not have throw rugs and other things on the floor that can make you trip. What can I do in the bedroom?  Use night lights.  Make sure that you have a light by your bed that is easy to reach.  Do not use any sheets or blankets that are too big for your bed. They should not hang down onto the floor.  Have a firm chair that has side arms. You can use this for support while you get dressed.  Do not have throw rugs and other things on the floor that can make you trip. What can I do  in the kitchen?  Clean up any spills right away.  Avoid walking on wet floors.  Keep items that you use a lot in easy-to-reach places.  If you need to reach something above you, use a strong step stool that has a grab bar.  Keep electrical cords out of the way.  Do not use floor polish or wax that makes floors slippery. If you must use wax, use non-skid floor wax.  Do not have throw rugs and other things on the floor that can make you trip. What can I do with my stairs?  Do not leave any items on the stairs.  Make sure that there are handrails on both sides of the stairs and use them. Fix handrails that are broken or loose. Make sure that handrails are as long as the stairways.  Check any carpeting to make sure that it is firmly attached to the stairs. Fix any carpet that is loose or worn.  Avoid having throw rugs at  the top or bottom of the stairs. If you do have throw rugs, attach them to the floor with carpet tape.  Make sure that you have a light switch at the top of the stairs and the bottom of the stairs. If you do not have them, ask someone to add them for you. What else can I do to help prevent falls?  Wear shoes that:  Do not have high heels.  Have rubber bottoms.  Are comfortable and fit you well.  Are closed at the toe. Do not wear sandals.  If you use a stepladder:  Make sure that it is fully opened. Do not climb a closed stepladder.  Make sure that both sides of the stepladder are locked into place.  Ask someone to hold it for you, if possible.  Clearly mark and make sure that you can see:  Any grab bars or handrails.  First and last steps.  Where the edge of each step is.  Use tools that help you move around (mobility aids) if they are needed. These include:  Canes.  Walkers.  Scooters.  Crutches.  Turn on the lights when you go into a dark area. Replace any light bulbs as soon as they burn out.  Set up your furniture so you have a clear path. Avoid moving your furniture around.  If any of your floors are uneven, fix them.  If there are any pets around you, be aware of where they are.  Review your medicines with your doctor. Some medicines can make you feel dizzy. This can increase your chance of falling. Ask your doctor what other things that you can do to help prevent falls. This information is not intended to replace advice given to you by your health care provider. Make sure you discuss any questions you have with your health care provider. Document Released: 02/04/2009 Document Revised: 09/16/2015 Document Reviewed: 05/15/2014 Elsevier Interactive Patient Education  2017 [employer].

## 2019-10-30 NOTE — Progress Notes (Signed)
Subjective:   [name] is a 54 y.o. female who presents for an Initial Medicare Annual Wellness Visit.  Review of Systems      Cardiac Risk Factors include: diabetes mellitus;sedentary lifestyle;smoking/ tobacco exposure;dyslipidemia;hypertension;obesity (BMI >30kg/m2)     Objective:    Today's Vitals   10/30/19 1502 10/30/19 1505  BP: 122/68   Pulse: 84   Resp: 16   Temp: (!) 97.2 F (36.2 C)   TempSrc: Temporal   SpO2: 95%   Weight: 266 lb 12.8 oz (121 kg)   Height: 5' 4"  (1.626 m)   PainSc:  6    Body mass index is 45.8 kg/m.  Advanced Directives 10/30/2019 08/15/2019 03/05/2019 03/04/2019 02/21/2019 09/21/2018 10/05/2017  Does Patient Have a Medical Advance Directive? No No - No No No No  Would patient like information on creating a medical advance directive? Yes (MAU/Ambulatory/Procedural Areas - Information given) - No - Patient declined - No - Guardian declined - No - Patient declined;Yes (Inpatient - patient defers creating a medical advance directive at this time)  Pre-existing out of facility DNR order (yellow form or pink MOST form) - - - - - - -  Some encounter information is confidential and restricted. Go to Review Flowsheets activity to see all data.    Current Medications (verified) Outpatient Encounter Medications as of 10/30/2019  Medication Sig  . ACCU-CHEK GUIDE test strip USE UP TO FOUR TIMES DAILY AS DIRECTED  . Accu-Chek Softclix Lancets lancets USE UP TO FOUR TIMES DAILY AS DIRECTED  . albuterol (VENTOLIN HFA) 108 (90 Base) MCG/ACT inhaler Inhale 2 puffs into the lungs every 6 (six) hours as needed for wheezing or shortness of breath.  . amphetamine-dextroamphetamine (ADDERALL) 30 MG tablet Take 1 tablet by mouth 2 (two) times daily.  [name] aspirin EC 81 MG tablet Take 1 tablet (81 mg total) by mouth daily.  [name] atorvastatin (LIPITOR) 40 MG tablet Take 1 tablet (40 mg total) by mouth daily.  . blood glucose meter kit and supplies Dispense based  on patient and insurance preference. Use up to four times daily as directed. (FOR ICD-10 E10.9, E11.9).  [name] Cholecalciferol (VITAMIN D-3) 125 MCG (5000 UT) TABS Take 5,000 Units by mouth daily.  . ciprofloxacin-dexamethasone (CIPRODEX) OTIC suspension Place 4-5 drops into the right ear 2 (two) times daily as needed (drainage).  . cycloSPORINE (RESTASIS) 0.05 % ophthalmic emulsion Place 1 drop into both eyes 2 (two) times daily.  . DULoxetine (CYMBALTA) 60 MG capsule TAKE 1 CAPSULE(60 MG) BY MOUTH AT BEDTIME (Patient taking differently: Take 60 mg by mouth at bedtime. TAKE 1 CAPSULE(60 MG) BY MOUTH AT BEDTIME)  . ezetimibe (ZETIA) 10 MG tablet Take 1 tablet (10 mg total) by mouth daily.  . fluticasone (FLONASE) 50 MCG/ACT nasal spray USE 1 SPRAY IN EACH NOSTRIL TWICE DAILY AFTER SINUS RINSES (Patient taking differently: Place 1 spray into both nostrils in the morning and at bedtime. USE 1 SPRAY IN EACH NOSTRIL TWICE DAILY AFTER SINUS RINSES)  . furosemide (LASIX) 40 MG tablet AS NEEDED for weight increase of 3 lbs overnight or 5 lbs in 1 week  . ibuprofen (ADVIL) 200 MG tablet Take 200 mg by mouth every 6 (six) hours as needed.  . Insulin Pen Needle (BD PEN NEEDLE NANO U/F) 32G X 4 MM MISC USE AS DIRECTED WITH VICTOZA  . INVOKANA 300 MG TABS tablet TAKE 1 TABLET(300 MG) BY MOUTH DAILY BEFORE BREAKFAST (Patient taking differently: Take 300 mg by mouth daily  before breakfast. )  . isosorbide mononitrate (IMDUR) 30 MG 24 hr tablet TAKE 1 TABLET BY MOUTH EVERY DAY (Patient taking differently: Take 30 mg by mouth daily. )  . Lancets Misc. (ACCU-CHEK SOFTCLIX LANCET DEV) KIT Check Blood sugar up to 4 times a day  . liraglutide (VICTOZA) 18 MG/3ML SOPN INJECT 0.3 MLS (1.8 MG TOTAL) INTO THE SKIN DAILY. (Patient taking differently: Inject 1.8 mg into the skin daily. INJECT 0.3 MLS (1.8 MG TOTAL) INTO THE SKIN DAILY.)  . losartan (COZAAR) 25 MG tablet Take 1 tablet (25 mg total) by mouth daily.  . metoprolol  tartrate (LOPRESSOR) 25 MG tablet Take 1 tablet (25 mg total) by mouth 2 (two) times daily.  . Multiple Vitamins-Minerals (MULTIVITAMIN WITH MINERALS) tablet Take 1 tablet by mouth daily.  . nitroGLYCERIN (NITROSTAT) 0.4 MG SL tablet Place 1 tablet (0.4 mg total) under the tongue every 5 (five) minutes as needed for chest pain.  . pantoprazole (PROTONIX) 40 MG tablet TAKE 1 TABLET BY MOUTH EVERY DAY 30 MINUTES BEFORE A MEAL  . pregabalin (LYRICA) 150 MG capsule TAKE 1 CAPSULE BY MOUTH TWICE DAILY  . [DISCONTINUED] VITAMIN D PO Take 1 tablet by mouth daily.  [name] albuterol (PROVENTIL) (2.5 MG/3ML) 0.083% nebulizer solution Take 3 mLs (2.5 mg total) by nebulization every 6 (six) hours as needed for wheezing or shortness of breath.   Facility-Administered Encounter Medications as of 10/30/2019  Medication  . sodium chloride flush (NS) 0.9 % injection 3 mL    Allergies (verified) Codeine, Metformin and related, Wellbutrin [bupropion hcl], and Acyclovir and related   History: Past Medical History:  Diagnosis Date  . ADHD   . Alcohol abuse   . Anemia   . Anxiety   . Asthmatic bronchitis    normal PFT/ seen by pulmonary no evidence of COPD  . Atypical chest pain 10/05/2017  . Back pain   . Bilateral swelling of feet   . Chest pain   . Chest pain on respiration 03/25/2014  . Chronic bronchitis ([place])   . Chronic lower back pain   . Chronic respiratory failure ([place]) 10/16/2011   Newly 02 dep 24/7 p discharge from [facility] 01/2013  - 03/13/2014  Walked RA  2 laps @ 185 ft each stopped due to  Sob/ aching in legs, thirsty/ no desat @ slow pace   . Chronic respiratory failure with hypoxia (HCC)    On 2-3 L of oxygen at home  . Cigarette smoker 12/02/2010   Followed in Pulmonary clinic/ [facility]/ [place]   - Limits of effective care reviewed 12/22/2011    . Complication of anesthesia   . Constipation   . COPD (chronic obstructive pulmonary disease) ([place])   . COPD with chronic bronchitis ([place])  10/01/2017  . Daily headache   . Depression   . Diabetic peripheral neuropathy ([place])   . DM (diabetes mellitus) type II controlled, neurological manifestation ([place]) 12/02/2010  . Drug use   . Gallbladder problem   . Gastric erosions    EGD 08/2010.  [name] GERD (gastroesophageal reflux disease)   . Glaucoma   . H/O drug abuse ([employer]) 11/12/2017   -- scanned document from outside source: [facility] in [place] which showed UDS positive for Adderall /amphetamine usage as well as positive urine for THC.  This test result was collected 08/11/2016 and reported 10/07/2016. - lso review of the chart shows another positive amphetamine, THC and METH in the urine back  on 10/18/2015 under "care everywhere". ---So   . Heavy menses   . High cholesterol   . History of blood transfusion    "related to low HgB" (10/05/2017)  . History of hiatal hernia   . HTN (hypertension)   . Hyperlipidemia associated with type 2 diabetes mellitus ([place]) 10/18/2015  . Hypertension associated with diabetes ([place]) 12/02/2010   D/c acei 12/22/2011 due to psuedowheeze and narcotic dependent cough> ? Improved - 70/35/0093 started bystolic in place of cozar due to cough    . IBS (irritable bowel syndrome)   . Increased urinary protein excretion   . Internal hemorrhoids    Colonoscopy 5/12.  . Iron deficiency anemia 10/01/2017  . Joint pain   . Mixed diabetic hyperlipidemia associated with type 2 diabetes mellitus ([place]) 10/01/2017  . On home oxygen therapy    "5L at night" (10/05/2017)  . OSA on CPAP   . Osteoarthritis    "back" (10/05/2017)  . Oxygen dependent 10/16/2011  . Pneumonia    "lots of times" (10/05/2017)  . PONV (postoperative nausea and vomiting)   . Poorly controlled diabetes mellitus ([place]) 11/12/2017  . Pulmonary infiltrates 12/22/2011   Followed in Pulmonary clinic/ [facility]/ [place]    - See CT Chest 05/05/11   . Shortness of breath   . Tachycardia    never had test done since  no insurance  . Tobacco use disorder-current smoker greater than 40-pack-year history- since age 52 1 ppd 10/01/2017  . Type II diabetes mellitus ([place])   . Vitamin D deficiency    Past Surgical History:  Procedure Laterality Date  . CESAREAN SECTION  1988; 1989  . CHOLECYSTECTOMY OPEN  1990  . COLONOSCOPY  09/16/2010   GH[facility]:EXHBZJ colon/small internal hemorrhoids  . ESOPHAGOGASTRODUODENOSCOPY  09/16/2010   SLF: normal/mild gastritis  . ESOPHAGOGASTRODUODENOSCOPY N/A 10/19/2014   Procedure: ESOPHAGOGASTRODUODENOSCOPY (EGD);  Surgeon: Danie Binder, MD;  Location: [facility];  Service: Endoscopy;  Laterality: N/A;  830  . FRACTURE SURGERY    . HYSTEROSCOPY WITH THERMACHOICE  01/17/2012   Procedure: HYSTEROSCOPY WITH THERMACHOICE;  Surgeon: Florian Buff, MD;  Location: [facility] ORS;  Service: Gynecology;  Laterality: N/A;  total therapy time: 9:13sec  D5W  18 ml in, D5W   51m out, temperture 87degrees celcious  . KIDNEY SURGERY     as child for blockages  . LEFT HEART CATH AND CORONARY ANGIOGRAPHY N/A 09/08/2019   Procedure: LEFT HEART CATH AND CORONARY ANGIOGRAPHY;  Surgeon: JMartinique Peter M, MD;  [facility];  Service: Cardiovascular;  Laterality: N/A;  . TONSILLECTOMY    . TUBAL LIGATION  1989  . TYMPANOSTOMY TUBE PLACEMENT Bilateral    "several times when I was a child"  . uterine ablation    . WRIST FRACTURE SURGERY Left 1995   Family History  Problem Relation Age of Onset  . Heart attack Father 419      deceased, etoh use  . Heart disease Father   . Alcohol abuse Father   . Depression Father   . Heart failure Father   . Sudden death Father   . Heart attack Mother [id number]      deceased  . Diabetes Mother   . Breast cancer Mother   . Heart failure Mother        oxygen dependence, nonsmoker  . Heart disease Mother   . Depression Mother   . Cancer Mother   . Hypertension Mother   . Hyperlipidemia Mother   .  Sudden death Mother   . Sleep apnea Mother   . Obesity  Mother   . Liver disease Maternal Aunt 88       died while on liver transplant list  . Heart attack Maternal Grandmother        premature CAD  . Ulcers Sister   . Hypertension Sister   . Heart failure Sister   . Depression Sister   . Anxiety disorder Sister   . Colon cancer Neg Hx    Social History   Socioeconomic History  . Marital status: Married    Spouse name: Not on file  . Number of children: 2  . Years of education: Not on file  . Highest education level: Not on file  Occupational History  . Occupation: unemployed  Tobacco Use  . Smoking status: Current Every Day Smoker    Packs/day: 1.50    Years: 40.00    Pack years: 60.00    Types: Cigarettes    Start date: 60  . Smokeless tobacco: Never Used  Vaping Use  . Vaping Use: Former  Substance and Sexual Activity  . Alcohol use: Yes    Comment: once a month - 3-4 mixed drinks  . Drug use: Yes    Types: Marijuana    Comment: daily use  . Sexual activity: Yes    Partners: Male    Birth control/protection: Surgical  Other Topics Concern  . Not on file  Social History Narrative  . Not on file   Social Determinants of Health   Financial Resource Strain: Low Risk   . Difficulty of Paying Living Expenses: Not hard at all  Food Insecurity: No Food Insecurity  . Worried About Charity fundraiser in the Last Year: Never true  . Ran Out of Food in the Last Year: Never true  Transportation Needs: No Transportation Needs  . Lack of Transportation (Medical): No  . Lack of Transportation (Non-Medical): No  Physical Activity: Inactive  . Days of Exercise per Week: 0 days  . Minutes of Exercise per Session: 0 min  Stress:   . Feeling of Stress :   Social Connections: Moderately Isolated  . Frequency of Communication with Friends and Family: More than three times a week  . Frequency of Social Gatherings with Friends and Family: More than three times a week  . Attends Religious Services: Never  . Active Member of  Clubs or Organizations: No  . Attends Archivist Meetings: Never  . Marital Status: Married    Tobacco Counseling Ready to quit: Not Answered Counseling given: Not Answered   Clinical Intake:  Pre-visit preparation completed: Yes  Pain : 0-10 Pain Score: 6  Pain Type: Chronic pain Pain Location: Shoulder Pain Orientation: Right Pain Onset: More than a month ago Pain Frequency: Intermittent Pain Relieving Factors: tylenol,ice, heat  Pain Relieving Factors: tylenol,ice, heat  Nutritional Status: BMI > 30  Obese Nutritional Risks: None Diabetes: Yes CBG done?: Yes (172 this morning per patient) CBG resulted in Enter/ Edit results?: No Did pt. bring in CBG monitor from home?: No  How often do you need to have someone help you when you read instructions, pamphlets, or other written materials from your doctor or pharmacy?: 1 - Never  Nutrition Risk Assessment:  Has the patient had any N/V/D within the last 2 months?  No  Does the patient have any non-healing wounds?  No  Has the patient had any unintentional weight loss or weight gain?  No  Diabetes:  Is the patient diabetic?  Yes  If diabetic, was a CBG obtained today?  Yes 176 per patient Did the patient bring in their glucometer from home?  No  How often do you monitor your CBG's? daily.   Financial Strains and Diabetes Management:  Are you having any financial strains with the device, your supplies or your medication? No .  Does the patient want to be seen by Chronic Care Management for management of their diabetes?  No  Would the patient like to be referred to a Nutritionist or for Diabetic Management?  No   Diabetic Exams:  Diabetic Eye Exam: Completed 07/04/2019.   Diabetic Foot Exam: . Pt has been advised about the importance in completing this exam.  To be completed by PCP  Interpreter Needed?: No  Comments: Caroleen Hamman LPN   Activities of Daily Living In your present state of health,  do you have any difficulty performing the following activities: 10/30/2019 08/15/2019  Hearing? N Y  Vision? N N  Difficulty concentrating or making decisions? N N  Comment - -  Walking or climbing stairs? N N  Dressing or bathing? N N  Doing errands, shopping? N [place] and eating ? N -  Using the Toilet? N -  In the past six months, have you accidently leaked urine? N -  Do you have problems with loss of bowel control? N -  Managing your Medications? N -  Managing your Finances? N -  Housekeeping or managing your Housekeeping? N -  Some encounter information is confidential and restricted. Go to Review Flowsheets activity to see all data.  Some recent data might be hidden     Immunizations and Health Maintenance Immunization History  Administered Date(s) Administered  . Influenza Split 01/11/2012, 04/08/2016  . Influenza,inj,Quad PF,6+ Mos 12/31/2012, 01/16/2014, 03/10/2015, 03/11/2018, 03/13/2019  . Influenza-Unspecified 01/29/2017  . PFIZER SARS-COV-2 Vaccination 10/17/2019  . Pneumococcal Polysaccharide-23 02/20/2012  . Tdap 03/11/2018   Health Maintenance Due  Topic Date Due  . Hepatitis C Screening  Never done  . MAMMOGRAM  03/04/2016  . FOOT EXAM  01/11/2019    Patient Care Team: Ronnald Nian, DO as PCP - General (Family Medicine) Belva Crome, MD as PCP - Cardiology (Cardiology) Danie Binder, MD (Inactive) (Gastroenterology) Cloria Spring, MD as Consulting Physician [facility])  Indicate any recent Medical Services you may have received from other than [facility] providers in the past year (date may be approximate).     Assessment:   This is a routine wellness examination for [place].  Hearing/Vision screen  Hearing Screening   125Hz  250Hz  500Hz  1000Hz  2000Hz  3000Hz  4000Hz  6000Hz  8000Hz   Right ear:           Left ear:           Comments: Mild hearing loss right ear  Vision Screening Comments: Last eye exam-2-3 months  ago-[facility] Wears glasses  Dietary issues and exercise activities discussed: Current Exercise Habits: The patient does not participate in regular exercise at present, Exercise limited by: None identified  Goals Addressed            This Visit's Progress   . Patient Stated       Would like to lose weight & quit smoking      Depression Screen PHQ 2/9 Scores 10/30/2019 06/30/2019 06/20/2019 09/24/2018 07/09/2018 03/11/2018 01/10/2018  PHQ - 2 Score 0 5 0 2 3 0 2  PHQ- 9 Score - 17 - 9 12 3 7     Fall Risk Fall Risk  10/30/2019 06/20/2019 10/01/2017  Falls in the past year? 0 0 No  Number falls in past yr: 0 0 -  Injury with Fall? 0 0 -  Follow up Falls prevention discussed - -    FALL RISK PREVENTION PERTAINING TO THE HOME:  Any stairs in or around the home? No  If so, are there any without handrails? No   Home free of loose throw rugs in walkways, pet beds, electrical cords, etc? Yes  Adequate lighting in your home to reduce risk of falls? Yes   ASSISTIVE DEVICES UTILIZED TO PREVENT FALLS:  Life alert? No  Use of a cane, walker or w/c? No  Grab bars in the bathroom? No  Shower chair or bench in shower? No  Elevated toilet seat or a handicapped toilet? No    TIMED UP AND GO:  Was the test performed? Yes .  Length of time to ambulate 10 feet: 9 sec.   GAIT:  Appearance of gait: Gait steady and fast without the use of an assistive device.  Education: Fall risk prevention has been discussed.  Intervention(s) required? No   DME/home health order needed?  No    Cognitive Function:No cognitive impairment noted.        Screening Tests Health Maintenance  Topic Date Due  . Hepatitis C Screening  Never done  . MAMMOGRAM  03/04/2016  . FOOT EXAM  01/11/2019  . COVID-19 Vaccine (2 - Pfizer 2-dose series) 11/07/2019  . INFLUENZA VACCINE  11/23/2019  . HEMOGLOBIN A1C  12/31/2019  . OPHTHALMOLOGY EXAM  07/03/2020  . COLONOSCOPY  08/22/2020  . PAP  SMEAR-Modifier  02/04/2022  . TETANUS/TDAP  03/11/2028  . PNEUMOCOCCAL POLYSACCHARIDE VACCINE AGE 63-64 HIGH RISK  Completed  . HIV Screening  Completed    Qualifies for Shingles Vaccine? Yes  Zostavax completed no. Due for Shingrix. Education has been provided regarding the importance of this vaccine. Pt has been advised to call insurance company to determine out of pocket expense. Advised may also receive vaccine at local pharmacy or Health Dept. Verbalized acceptance and understanding.  Tdap: Up to date  Flu Vaccine: Due 12/2019  Pneumococcal Vaccine: Patient had Pneumovax 23 in 2013. She plans to discuss the need for another vaccine with pulmonologist.  COVID-19 vaccine: First dose of Pfizer received on 10/17/2019  Cancer Screenings:  Colorectal Screening: Completed 2012. Repeat every 10 years;   Mammogram:. Ordered today. Pt aware that someone will be calling to schedule  Bone Density: Not indicated  Lung Cancer Screening: (Low Dose CT Chest recommended if Age 64-80 years, 30 pack-year currently smoking OR have quit w/in 15years.) does not qualify.     Additional Screening:  Hepatitis C Screening:Discuss with PCP   Dental Screening: Recommended annual dental exams for proper oral hygiene  Community Resource Referral:  CRR required this visit?  No       Plan:  I have personally reviewed and addressed the Medicare Annual Wellness questionnaire and have noted the following in the patient's chart:  A. Medical and social history B. Use of alcohol, tobacco or illicit drugs  C. Current medications and supplements D. Functional ability and status E.  Nutritional status F.  Physical activity G. Advance directives H. List of other physicians I.  Hospitalizations, surgeries, and ER visits in previous 12 months J.  [place] such as hearing and vision if needed, cognitive and  depression L. Referrals and appointments   In addition, I have reviewed and discussed  with patient certain preventive protocols, quality metrics, and best practice recommendations. A written personalized care plan for preventive services as well as general preventive health recommendations were provided to patient.   Signed,    Marta Antu, LPN   [id number]/06/4915  Nurse Health Advisor    Nurse Notes:

## 2019-10-31 DIAGNOSIS — G4733 Obstructive sleep apnea (adult) (pediatric): Secondary | ICD-10-CM | POA: Diagnosis not present

## 2019-11-04 ENCOUNTER — Other Ambulatory Visit: Payer: Self-pay

## 2019-11-04 ENCOUNTER — Ambulatory Visit
Admission: RE | Admit: 2019-11-04 | Discharge: 2019-11-04 | Disposition: A | Discharge status: Home / Self Care | Payer: Medicare HMO | Source: Ambulatory Visit | Attending: Family Medicine | Admitting: Family Medicine

## 2019-11-04 ENCOUNTER — Other Ambulatory Visit: Payer: Self-pay | Admitting: Family Medicine

## 2019-11-04 DIAGNOSIS — Z1231 Encounter for screening mammogram for malignant neoplasm of breast: Secondary | ICD-10-CM | POA: Diagnosis not present

## 2019-11-05 DIAGNOSIS — H40053 Ocular hypertension, bilateral: Secondary | ICD-10-CM | POA: Diagnosis not present

## 2019-11-05 DIAGNOSIS — H10413 Chronic giant papillary conjunctivitis, bilateral: Secondary | ICD-10-CM | POA: Diagnosis not present

## 2019-11-05 DIAGNOSIS — H25813 Combined forms of age-related cataract, bilateral: Secondary | ICD-10-CM | POA: Diagnosis not present

## 2019-11-05 DIAGNOSIS — H16223 Keratoconjunctivitis sicca, not specified as Sjogren's, bilateral: Secondary | ICD-10-CM | POA: Diagnosis not present

## 2019-11-06 ENCOUNTER — Other Ambulatory Visit: Payer: Self-pay

## 2019-11-06 ENCOUNTER — Ambulatory Visit (INDEPENDENT_AMBULATORY_CARE_PROVIDER_SITE_OTHER): Payer: Medicare HMO

## 2019-11-06 ENCOUNTER — Ambulatory Visit (INDEPENDENT_AMBULATORY_CARE_PROVIDER_SITE_OTHER): Payer: Medicare HMO | Admitting: Family Medicine

## 2019-11-06 ENCOUNTER — Encounter: Payer: Self-pay | Admitting: Family Medicine

## 2019-11-06 VITALS — BP 120/72 | HR 82 | Temp 97.7°F | Ht 64.0 in | Wt 266.0 lb

## 2019-11-06 DIAGNOSIS — G8929 Other chronic pain: Secondary | ICD-10-CM | POA: Diagnosis not present

## 2019-11-06 DIAGNOSIS — M25511 Pain in right shoulder: Secondary | ICD-10-CM

## 2019-11-06 DIAGNOSIS — M5441 Lumbago with sciatica, right side: Secondary | ICD-10-CM | POA: Diagnosis not present

## 2019-11-06 DIAGNOSIS — M47816 Spondylosis without myelopathy or radiculopathy, lumbar region: Secondary | ICD-10-CM | POA: Diagnosis not present

## 2019-11-06 DIAGNOSIS — M5442 Lumbago with sciatica, left side: Secondary | ICD-10-CM

## 2019-11-06 DIAGNOSIS — M19011 Primary osteoarthritis, right shoulder: Secondary | ICD-10-CM | POA: Diagnosis not present

## 2019-11-06 MED ORDER — MELOXICAM 15 MG PO TABS: 15.0000 mg | ORAL_TABLET | Freq: Every day | ORAL | 0 refills | Status: DC

## 2019-11-06 NOTE — Progress Notes (Signed)
[name] is a 54 y.o. female  Chief Complaint  Patient presents with  . Acute Visit    Pt c/o  Rt shoulder pain x 1year and has been using Tylenol and Ibuprofen which pt said has helped but now pain has worsened.  Pt c/o Hx lower back pain for severl years, pt also said both hands always feels numb.    HPI: [name] is a 54 y.o. female complains of 1 year h/o Rt shoulder pain. Worse recently. She has pain at rest, pain at night that interferes with sleep. Tylenol, ibuprofen were helpful but not any longer. Some neck stiffness which is chronic, not worse.   Also pt notes a long h/o lower back pain. Walking or standing for any length of time exacerbate it. Pain can radiate to upper legs. Some numbness/tingling in upper thighs.  She has underlying diabetic neuropathy and this is helped by lyrica.   Wt Readings from Last 3 Encounters:  11/06/19 266 lb (120.7 kg)  10/30/19 266 lb 12.8 oz (121 kg)  10/29/19 264 lb 6.4 oz (119.9 kg)    Past Medical History:  Diagnosis Date  . ADHD   . Alcohol abuse   . Anemia   . Anxiety   . Asthmatic bronchitis    normal PFT/ seen by pulmonary no evidence of COPD  . Atypical chest pain 10/05/2017  . Back pain   . Bilateral swelling of feet   . Chest pain   . Chest pain on respiration 03/25/2014  . Chronic bronchitis ([place])   . Chronic lower back pain   . Chronic respiratory failure ([place]) 10/16/2011   Newly 02 dep 24/7 p discharge from [facility] 01/2013  - 03/13/2014  Walked RA  2 laps @ 185 ft each stopped due to  Sob/ aching in legs, thirsty/ no desat @ slow pace   . Chronic respiratory failure with hypoxia (HCC)    On 2-3 L of oxygen at home  . Cigarette smoker 12/02/2010   Followed in Pulmonary clinic/  [facility]/ [place]   - Limits of effective care reviewed 12/22/2011    . Complication of anesthesia   . Constipation   . COPD (chronic obstructive pulmonary disease) ([place])   . COPD with chronic bronchitis ([place])  10/01/2017  . Daily headache   . Depression   . Diabetic peripheral neuropathy ([place])   . DM (diabetes mellitus) type II controlled, neurological manifestation ([place]) 12/02/2010  . Drug use   . Gallbladder problem   . Gastric erosions    EGD 08/2010.  [name] GERD (gastroesophageal reflux disease)   . Glaucoma   . H/O drug abuse ([place]) 11/12/2017   -- scanned document from outside source: [facility] in [place] which showed UDS positive for Adderall /amphetamine usage as well as positive urine for THC.  This test result was collected 08/11/2016 and reported 10/07/2016. - lso review of the chart shows another positive amphetamine, THC and METH in the urine back on 10/18/2015 under "care everywhere". ---So   . Heavy menses   . High cholesterol   . History of blood transfusion    "related to low HgB" (10/05/2017)  . History of hiatal hernia   . HTN (hypertension)   . Hyperlipidemia associated with type 2 diabetes mellitus ([place]) 10/18/2015  . Hypertension associated with diabetes ([place]) 12/02/2010   D/c acei 12/22/2011 due to psuedowheeze and narcotic dependent cough> ? Improved - 17/35/6701 started bystolic in place of  cozar due to cough    . IBS (irritable bowel syndrome)   . Increased urinary protein excretion   . Internal hemorrhoids    Colonoscopy 5/12.  . Iron deficiency anemia 10/01/2017  . Joint pain   . Mixed diabetic hyperlipidemia associated with type 2 diabetes mellitus ([place]) 10/01/2017  . On home oxygen therapy    "5L at night" (10/05/2017)  . OSA on CPAP   . Osteoarthritis    "back" (10/05/2017)  . Oxygen dependent 10/16/2011  . Pneumonia    "lots of times" (10/05/2017)  . PONV (postoperative nausea and vomiting)   . Poorly controlled diabetes mellitus ([place]) 11/12/2017  . Pulmonary infiltrates 12/22/2011   Followed in Pulmonary clinic/ [facility]/ [place]    - See CT Chest 05/05/11   . Shortness of breath   . Tachycardia    never had test done since  no insurance  . Tobacco use disorder-current smoker greater than 40-pack-year history- since age 70 1 ppd 10/01/2017  . Type II diabetes mellitus ([place])   . Vitamin D deficiency     Past Surgical History:  Procedure Laterality Date  . CESAREAN SECTION  1988; 1989  . CHOLECYSTECTOMY OPEN  1990  . COLONOSCOPY  09/16/2010   ZSW:FUXNAT colon/small internal hemorrhoids  . ESOPHAGOGASTRODUODENOSCOPY  09/16/2010   SLF: normal/mild gastritis  . ESOPHAGOGASTRODUODENOSCOPY N/A 10/19/2014   Procedure: ESOPHAGOGASTRODUODENOSCOPY (EGD);  Surgeon: Danie Binder, MD;  Location: [facility];  Service: Endoscopy;  Laterality: N/A;  830  . FRACTURE SURGERY    . HYSTEROSCOPY WITH THERMACHOICE  01/17/2012   Procedure: HYSTEROSCOPY WITH THERMACHOICE;  Surgeon: Florian Buff, MD;  Location: [facility];  Service: Gynecology;  Laterality: N/A;  total therapy time: 9:13sec  D5W  18 ml in, D5W   67m out, temperture 87degrees celcious  . KIDNEY SURGERY     as child for blockages  . LEFT HEART CATH AND CORONARY ANGIOGRAPHY N/A 09/08/2019   Procedure: LEFT HEART CATH AND CORONARY ANGIOGRAPHY;  Surgeon: JMartinique Peter M, MD;  [facility];  Service: Cardiovascular;  Laterality: N/A;  . TONSILLECTOMY    . TUBAL LIGATION  1989  . TYMPANOSTOMY TUBE PLACEMENT Bilateral    "several times when I was a child"  . uterine ablation    . WRIST FRACTURE SURGERY Left 1995    Social History   Socioeconomic History  . Marital status: Married    Spouse name: Not on file  . Number of children: 2  . Years of education: Not on file  . Highest education level: Not on file  Occupational History  . Occupation: unemployed  Tobacco Use  . Smoking status: Current Every Day Smoker    Packs/day: 1.50    Years: 40.00    Pack years: 60.00    Types: Cigarettes    Start date: 89 . Smokeless tobacco: Never Used  Vaping Use  . Vaping Use: Former  Substance and Sexual Activity  . Alcohol use: Yes    Comment: once a  month - 3-4 mixed drinks  . Drug use: Yes    Types: Marijuana    Comment: daily use  . Sexual activity: Yes    Partners: Male    Birth control/protection: Surgical  Other Topics Concern  . Not on file  Social History Narrative  . Not on file   Social Determinants of Health   Financial Resource Strain: Low Risk   . Difficulty of Paying Living Expenses: Not hard at all  Food Insecurity: No Food Insecurity  . Worried About Charity fundraiser in the Last Year: Never true  . Ran Out of Food in the Last Year: Never true  Transportation Needs: No Transportation Needs  . Lack of Transportation (Medical): No  . Lack of Transportation (Non-Medical): No  Physical Activity: Inactive  . Days of Exercise per Week: 0 days  . Minutes of Exercise per Session: 0 min  Stress:   . Feeling of Stress :   Social Connections: Moderately Isolated  . Frequency of Communication with Friends and Family: More than three times a week  . Frequency of Social Gatherings with Friends and Family: More than three times a week  . Attends Religious Services: Never  . Active Member of Clubs or Organizations: No  . Attends Archivist Meetings: Never  . Marital Status: Married  Human resources officer Violence:   . Fear of Current or Ex-Partner:   . Emotionally Abused:   [name] Physically Abused:   . Sexually Abused:     Family History  Problem Relation Age of Onset  . Heart attack Father 26       deceased, etoh use  . Heart disease Father   . Alcohol abuse Father   . Depression Father   . Heart failure Father   . Sudden death Father   . Heart attack Mother 21       deceased  . Diabetes Mother   . Breast cancer Mother   . Heart failure Mother        oxygen dependence, nonsmoker  . Heart disease Mother   . Depression Mother   . Cancer Mother   . Hypertension Mother   . Hyperlipidemia Mother   . Sudden death Mother   . Sleep apnea Mother   . Obesity Mother   . Liver disease Maternal Aunt 28        died while on liver transplant list  . Heart attack Maternal Grandmother        premature CAD  . Ulcers Sister   . Hypertension Sister   . Heart failure Sister   . Depression Sister   . Anxiety disorder Sister   . Colon cancer Neg Hx      Immunization History  Administered Date(s) Administered  . Influenza Split 01/11/2012, 04/08/2016  . Influenza,inj,Quad PF,6+ Mos 12/31/2012, 01/16/2014, 03/10/2015, 03/11/2018, 03/13/2019  . Influenza-Unspecified 01/29/2017  . PFIZER SARS-COV-2 Vaccination 10/17/2019  . Pneumococcal Polysaccharide-23 02/20/2012  . Tdap 03/11/2018    Outpatient Encounter Medications as of 11/06/2019  Medication Sig  . ACCU-CHEK GUIDE test strip USE UP TO FOUR TIMES DAILY AS DIRECTED  . Accu-Chek Softclix Lancets lancets USE UP TO FOUR TIMES DAILY AS DIRECTED  . albuterol (VENTOLIN HFA) 108 (90 Base) MCG/ACT inhaler Inhale 2 puffs into the lungs every 6 (six) hours as needed for wheezing or shortness of breath.  . amphetamine-dextroamphetamine (ADDERALL) 30 MG tablet Take 1 tablet by mouth 2 (two) times daily.  [name] aspirin EC 81 MG tablet Take 1 tablet (81 mg total) by mouth daily.  [name] atorvastatin (LIPITOR) 40 MG tablet Take 1 tablet (40 mg total) by mouth daily.  . blood glucose meter kit and supplies Dispense based on patient and insurance preference. Use up to four times daily as directed. (FOR ICD-10 E10.9, E11.9).  [name] Cholecalciferol (VITAMIN D-3) 125 MCG (5000 UT) TABS Take 5,000 Units by mouth daily.  . ciprofloxacin-dexamethasone (CIPRODEX) OTIC suspension Place 4-5 drops into the right ear 2 (two)  times daily as needed (drainage).  . cycloSPORINE (RESTASIS) 0.05 % ophthalmic emulsion Place 1 drop into both eyes 2 (two) times daily.  . DULoxetine (CYMBALTA) 60 MG capsule TAKE 1 CAPSULE(60 MG) BY MOUTH AT BEDTIME (Patient taking differently: Take 60 mg by mouth at bedtime. TAKE 1 CAPSULE(60 MG) BY MOUTH AT BEDTIME)  . ezetimibe (ZETIA) 10 MG tablet Take 1 tablet  (10 mg total) by mouth daily.  . fluticasone (FLONASE) 50 MCG/ACT nasal spray USE 1 SPRAY IN EACH NOSTRIL TWICE DAILY AFTER SINUS RINSES (Patient taking differently: Place 1 spray into both nostrils in the morning and at bedtime. USE 1 SPRAY IN EACH NOSTRIL TWICE DAILY AFTER SINUS RINSES)  . furosemide (LASIX) 40 MG tablet AS NEEDED for weight increase of 3 lbs overnight or 5 lbs in 1 week  . ibuprofen (ADVIL) 200 MG tablet Take 200 mg by mouth every 6 (six) hours as needed.  . Insulin Pen Needle (BD PEN NEEDLE NANO U/F) 32G X 4 MM MISC USE AS DIRECTED WITH VICTOZA  . INVOKANA 300 MG TABS tablet TAKE 1 TABLET(300 MG) BY MOUTH DAILY BEFORE BREAKFAST (Patient taking differently: Take 300 mg by mouth daily before breakfast. )  . isosorbide mononitrate (IMDUR) 30 MG 24 hr tablet TAKE 1 TABLET BY MOUTH EVERY DAY (Patient taking differently: Take 30 mg by mouth daily. )  . Lancets Misc. (ACCU-CHEK SOFTCLIX LANCET DEV) KIT Check Blood sugar up to 4 times a day  . liraglutide (VICTOZA) 18 MG/3ML SOPN INJECT 0.3 MLS (1.8 MG TOTAL) INTO THE SKIN DAILY. (Patient taking differently: Inject 1.8 mg into the skin daily. INJECT 0.3 MLS (1.8 MG TOTAL) INTO THE SKIN DAILY.)  . losartan (COZAAR) 25 MG tablet Take 1 tablet (25 mg total) by mouth daily.  . metoprolol tartrate (LOPRESSOR) 25 MG tablet Take 1 tablet (25 mg total) by mouth 2 (two) times daily.  . Multiple Vitamins-Minerals (MULTIVITAMIN WITH MINERALS) tablet Take 1 tablet by mouth daily.  . nitroGLYCERIN (NITROSTAT) 0.4 MG SL tablet Place 1 tablet (0.4 mg total) under the tongue every 5 (five) minutes as needed for chest pain.  . pantoprazole (PROTONIX) 40 MG tablet TAKE 1 TABLET BY MOUTH EVERY DAY 30 MINUTES BEFORE A MEAL  . pregabalin (LYRICA) 150 MG capsule TAKE 1 CAPSULE BY MOUTH TWICE DAILY  . albuterol (PROVENTIL) (2.5 MG/3ML) 0.083% nebulizer solution Take 3 mLs (2.5 mg total) by nebulization every 6 (six) hours as needed for wheezing or shortness of  breath.   Facility-Administered Encounter Medications as of 11/06/2019  Medication  . sodium chloride flush (NS) 0.9 % injection 3 mL     ROS: Pertinent positives and negatives noted in HPI. Remainder of ROS non-contributory   Allergies  Allergen Reactions  . Codeine Itching and Nausea Only  . Metformin And Related Diarrhea and Other (See Comments)    Stomach pain/nausea  . Wellbutrin [Bupropion Hcl] Hives  . Acyclovir And Related Rash    BP 120/72 (BP Location: Left Arm, Patient Position: Sitting, Cuff Size: Normal)   Pulse 82   Temp 97.7 F (36.5 C) (Temporal)   Ht _0  (1.626 m)   Wt 266 lb (120.7 kg)   LMP 08/01/2014 Comment: irregular  SpO2 96%   BMI 45.66 kg/m   Physical Exam Constitutional:      Appearance: She is obese. She is not ill-appearing.  Musculoskeletal:     Right shoulder: Tenderness present. No swelling, deformity or crepitus. Decreased range of motion. Normal strength. Normal pulse.  Lumbar back: Tenderness and bony tenderness present. No deformity or spasms. Decreased range of motion.  Neurological:     Mental Status: She is alert.      A/P:  1. Chronic right shoulder pain 2. Chronic bilateral low back pain with bilateral sciatica - present x at least 1 year, worse in past few months - DG Shoulder Right - DG Lumbar Spine Complete Rx: - meloxicam (MOBIC) 15 MG tablet; Take 1 tablet (15 mg total) by mouth daily.  Dispense: 90 tablet; Refill: 0 - Ambulatory referral to Orthopedic Surgery Discussed plan and reviewed medications with patient, including risks, benefits, and potential side effects. Pt expressed understand. All questions answered.  This visit occurred during the SARS-CoV-2 public health emergency.  Safety protocols were in place, including screening questions prior to the visit, additional usage of staff PPE, and extensive cleaning of exam room while observing appropriate contact time as indicated for disinfecting solutions.

## 2019-11-07 ENCOUNTER — Encounter: Payer: Self-pay | Admitting: Family Medicine

## 2019-11-11 ENCOUNTER — Other Ambulatory Visit: Payer: Self-pay | Admitting: Family Medicine

## 2019-11-11 DIAGNOSIS — E1169 Type 2 diabetes mellitus with other specified complication: Secondary | ICD-10-CM

## 2019-11-11 NOTE — Telephone Encounter (Signed)
Last OV 11/06/19 Last fill for Flonase 08/13/19  #16g/2 Last fill for Victoza 12/11/18  #58ml/1

## 2019-11-13 DIAGNOSIS — G4733 Obstructive sleep apnea (adult) (pediatric): Secondary | ICD-10-CM | POA: Diagnosis not present

## 2019-11-21 ENCOUNTER — Encounter: Payer: Self-pay | Admitting: Family Medicine

## 2019-11-21 ENCOUNTER — Other Ambulatory Visit: Payer: Self-pay

## 2019-11-21 ENCOUNTER — Ambulatory Visit (INDEPENDENT_AMBULATORY_CARE_PROVIDER_SITE_OTHER): Payer: Medicare HMO | Admitting: Family Medicine

## 2019-11-21 DIAGNOSIS — M545 Low back pain, unspecified: Secondary | ICD-10-CM

## 2019-11-21 DIAGNOSIS — R200 Anesthesia of skin: Secondary | ICD-10-CM | POA: Diagnosis not present

## 2019-11-21 DIAGNOSIS — M25511 Pain in right shoulder: Secondary | ICD-10-CM | POA: Diagnosis not present

## 2019-11-21 DIAGNOSIS — G8929 Other chronic pain: Secondary | ICD-10-CM | POA: Diagnosis not present

## 2019-11-21 DIAGNOSIS — G4733 Obstructive sleep apnea (adult) (pediatric): Secondary | ICD-10-CM | POA: Diagnosis not present

## 2019-11-21 MED ORDER — NABUMETONE 500 MG PO TABS: 500.0000 mg | ORAL_TABLET | Freq: Two times a day (BID) | ORAL | 3 refills | Status: DC | PRN

## 2019-11-21 MED ORDER — BACLOFEN 10 MG PO TABS: 5.0000 mg | ORAL_TABLET | Freq: Three times a day (TID) | ORAL | 3 refills | Status: DC | PRN

## 2019-11-21 NOTE — Progress Notes (Signed)
Office Visit Note   Patient: [name]           Date of Birth: 01-28-1966           MRN: [id number] Visit Date: 11/21/2019 Requested by: Ronnald Nian, DO [place],  [place] [id number] PCP: Ronnald Nian, DO  Subjective: Chief Complaint  Patient presents with  . Lower Back - Pain    Chronic pain x 2-3 years. Was intermittent, but now it is all the time. Numbness anterior bilateral thighs. NKI Hurts in all positions.  . Right Shoulder - Pain    Pain x 1 year. NKI. Started as just hurting with overexertion. Now hurts all the time. Decreased ROM due to pain. Numbness/tingling in both hands. Does have stiffness in the neck, but no pain. Meloxicam helps some.    HPI: She is here with right shoulder and low back pain.  Shoulder started bothering her about a year ago, no injury.  Gradual onset of pain, and deep ache sensation in her shoulder.  It hurts to reach overhead or behind her back.  Denies any neck pain.  She does get some stiffness in the neck, but it does not hurt with movement.  She reports numbness and tingling in both hands.  She presumed it was from neuropathy.  She has neuropathy in her feet from diabetes.  Her A1c was recently about 7.8 per patient report.  Her low back has hurt for several years intermittently.  Lately has been hurting every day.  Pain in the midline with no significant radicular pain.  She had x-rays recently and is now here for evaluation.               ROS:   All other systems were reviewed and are negative.  Objective: Vital Signs: LMP 08/01/2014 Comment: irregular  Physical Exam:  General:  Alert and oriented, in no acute distress. Pulm:  Breathing unlabored. Psy:  Normal mood, congruent affect.  Right shoulder: She has full active range of motion with pain at the extremes.  She has some tenderness at the [facility] joint and in the lateral subacromial space.  Rotator cuff strength is 5/5 throughout.  Biceps,  triceps, wrist and intrinsic hand strength are normal.  She has positive Tinel's at both carpal tunnels and positive Phalen's test recreating the numbness in her hands. Low back: Tender in the midline at L4-5 and L5-S1.  Negative straight leg raise, lower extremity strength and reflexes are normal.   Imaging: Recent x-rays reviewed on computer.  She has mild to moderate right shoulder AC joint and glenohumeral DJD.  Lumbar spine has lower lumbar facet DJD.   Assessment & Plan: 1.  Chronic right shoulder pain with osteoarthritis, impingement symptoms. -Elected to try physical therapy, Relafen as needed, glucosamine and turmeric. -Could contemplate injection if symptoms worsen, although would like to avoid that due to suboptimally controlled diabetes.  2.  Bilateral hand numbness, probable carpal tunnel syndrome -Night splints for 6 to 8 weeks.  If symptoms persist, then nerve studies.  3.  Chronic low back pain with facet arthropathy -Physical therapy, medications as above.  MRI if she fails to improve.     Procedures: No procedures performed  No notes on file     PMFS History: Patient Active Problem List   Diagnosis Date Noted  . Abnormal CXR 10/18/2019  . COPD  GOLD 0 / CB features/ still smoking 10/17/2019  . Angina pectoris ([place]) 09/08/2019  .  No-show for appointment 08/26/2019  . MDD (major depressive disorder), severe ([place]) 03/07/2019  . Benzodiazepine overdose 03/05/2019  . Generalized muscle ache 09/24/2018  . Personal history of noncompliance with medical treatment, presenting hazards to health 09/24/2018  . SOB (shortness of breath) 07/09/2018  . Fever, low grade 07/09/2018  . Cough in adult 07/09/2018  . Diabetes mellitus ([place]) 03/11/2018  . Chronic right ear pain 03/11/2018  . Chronic sinusitis 03/11/2018  . H/O drug abuse ([place]) 11/12/2017  . Positive for macroalbuminuria 11/12/2017  . h/o freq Vaginal yeast infections 11/12/2017  . Poorly controlled  diabetes mellitus ([place]) 11/12/2017  . Atypical chest pain 10/05/2017  . COPD with chronic bronchitis ([place]) 10/01/2017  . Mixed diabetic hyperlipidemia associated with type 2 diabetes mellitus ([place]) 10/01/2017  . Tobacco use disorder-current smoker greater than 40-pack-year history- since age 84 1 ppd 10/01/2017  . Tobacco abuse counseling 10/01/2017  . Diabetic peripheral neuropathy ([place]) 10/01/2017  . Iron deficiency anemia 10/01/2017  . Adult attention deficit hyperactivity disorder 01/14/2016  . Gastric reflux 10/18/2015  . Major depressive disorder, recurrent severe without psychotic features ([place])   . Nasal congestion 05/03/2014  . Chest pain on respiration 03/25/2014  . Leukocytosis 03/25/2014  . Upper airway cough syndrome 02/28/2014  . Abnormal drug screen 04/11/2013  . OSA on CPAP 04/11/2013  . Perforated ear drum 05/22/2012  . Bell's palsy 01/23/2012  . Carpal tunnel syndrome 01/23/2012  . Carpal tunnel syndrome on both sides 01/11/2012  . Chronic respiratory failure ([place]) 10/16/2011  . Oxygen dependent 10/16/2011  . Peripheral neuropathy 07/07/2011  . Vitamin D deficiency 07/06/2011  . Asthmatic bronchitis 06/11/2011  . Back pain 06/11/2011  . Morbid obesity ([place]) 05/06/2011  . Depression 05/04/2011  . Gastric erosions 12/05/2010  . Hypertension associated with diabetes ([place]) 12/02/2010  . Diabetes mellitus, type II ([place]) 12/02/2010  . Cigarette smoker 12/02/2010  . COPD with acute exacerbation ([place]) 11/30/2010  . Microcytic anemia 09/08/2010  . GERD (gastroesophageal reflux disease) 09/08/2010  . Esophageal dysphagia 09/08/2010   Past Medical History:  Diagnosis Date  . ADHD   . Alcohol abuse   . Anemia   . Anxiety   . Asthmatic bronchitis    normal PFT/ seen by pulmonary no evidence of COPD  . Atypical chest pain 10/05/2017  . Back pain   . Bilateral swelling of feet   . Chest pain   . Chest pain on respiration 03/25/2014  . Chronic bronchitis ([place])   .  Chronic lower back pain   . Chronic respiratory failure ([place]) 10/16/2011   Newly 02 dep 24/7 p discharge from [facility] 01/2013  - 03/13/2014  Walked RA  2 laps @ 185 ft each stopped due to  Sob/ aching in legs, thirsty/ no desat @ slow pace   . Chronic respiratory failure with hypoxia (H[facility])    On 2-3 L of oxygen at home  . Cigarette smoker 12/02/2010   Followed in Pulmonary clinic/ [facility]/ [place]   - Limits of effective care reviewed 12/22/2011    . Complication of anesthesia   . Constipation   . COPD (chronic obstructive pulmonary disease) ([place])   . COPD with chronic bronchitis ([place]) 10/01/2017  . Daily headache   . Depression   . Diabetic peripheral neuropathy ([place])   . DM (diabetes mellitus) type II controlled, neurological manifestation ([place]) 12/02/2010  . Drug use   . Gallbladder problem   . Gastric erosions    EGD 08/2010.  [name] GERD (gastroesophageal reflux  disease)   . Glaucoma   . H/O drug abuse ([place]) 11/12/2017   -- scanned document from outside source: [facility] in [place] which showed UDS positive for Adderall /amphetamine usage as well as positive urine for THC.  This test result was collected 08/11/2016 and reported 10/07/2016. - lso review of the chart shows another positive amphetamine, THC and METH in the urine back on 10/18/2015 under "care everywhere". ---So   . Heavy menses   . High cholesterol   . History of blood transfusion    "related to low HgB" (10/05/2017)  . History of hiatal hernia   . HTN (hypertension)   . Hyperlipidemia associated with type 2 diabetes mellitus ([place]) 10/18/2015  . Hypertension associated with diabetes ([place]) 12/02/2010   D/c acei 12/22/2011 due to psuedowheeze and narcotic dependent cough> ? Improved - 61/60/7371 started bystolic in place of cozar due to cough    . IBS (irritable bowel syndrome)   . Increased urinary protein excretion   . Internal hemorrhoids    Colonoscopy 5/12.  . Iron deficiency  anemia 10/01/2017  . Joint pain   . Mixed diabetic hyperlipidemia associated with type 2 diabetes mellitus ([place]) 10/01/2017  . On home oxygen therapy    "5L at night" (10/05/2017)  . OSA on CPAP   . Osteoarthritis    "back" (10/05/2017)  . Oxygen dependent 10/16/2011  . Pneumonia    "lots of times" (10/05/2017)  . PONV (postoperative nausea and vomiting)   . Poorly controlled diabetes mellitus ([place]) 11/12/2017  . Pulmonary infiltrates 12/22/2011   Followed in Pulmonary clinic/ [facility]/ [place]    - See CT Chest 05/05/11   . Shortness of breath   . Tachycardia    never had test done since no insurance  . Tobacco use disorder-current smoker greater than 40-pack-year history- since age 73 1 ppd 10/01/2017  . Type II diabetes mellitus ([place])   . Vitamin D deficiency     Family History  Problem Relation Age of Onset  . Heart attack Father 51       deceased, etoh use  . Heart disease Father   . Alcohol abuse Father   . Depression Father   . Heart failure Father   . Sudden death Father   . Heart attack Mother 41       deceased  . Diabetes Mother   . Breast cancer Mother   . Heart failure Mother        oxygen dependence, nonsmoker  . Heart disease Mother   . Depression Mother   . Cancer Mother   . Hypertension Mother   . Hyperlipidemia Mother   . Sudden death Mother   . Sleep apnea Mother   . Obesity Mother   . Liver disease Maternal Aunt 34       died while on liver transplant list  . Heart attack Maternal Grandmother        premature CAD  . Ulcers Sister   . Hypertension Sister   . Heart failure Sister   . Depression Sister   . Anxiety disorder Sister   . Colon cancer Neg Hx     Past Surgical History:  Procedure Laterality Date  . CESAREAN SECTION  1988; 1989  . CHOLECYSTECTOMY OPEN  1990  . COLONOSCOPY  09/16/2010   GGY:IRSWNI colon/small internal hemorrhoids  . ESOPHAGOGASTRODUODENOSCOPY  09/16/2010   SLF: normal/mild gastritis  . ESOPHAGOGASTRODUODENOSCOPY N/A  10/19/2014   Procedure: ESOPHAGOGASTRODUODENOSCOPY (EGD);  Surgeon: Danie Binder, MD;  Location: [facility];  Service: Endoscopy;  Laterality: N/A;  830  . FRACTURE SURGERY    . HYSTEROSCOPY WITH THERMACHOICE  01/17/2012   Procedure: HYSTEROSCOPY WITH THERMACHOICE;  Surgeon: Florian Buff, MD;  Location: [facility];  Service: Gynecology;  Laterality: N/A;  total therapy time: 9:13sec  D5W  18 ml in, D5W   6ml out, temperture 87degrees celcious  . KIDNEY SURGERY     as child for blockages  . LEFT HEART CATH AND CORONARY ANGIOGRAPHY N/A 09/08/2019   Procedure: LEFT HEART CATH AND CORONARY ANGIOGRAPHY;  Surgeon: Martinique, Peter M, MD;  Location: [facility];  Service: Cardiovascular;  Laterality: N/A;  . TONSILLECTOMY    . TUBAL LIGATION  1989  . TYMPANOSTOMY TUBE PLACEMENT Bilateral    "several times when I was a child"  . uterine ablation    . WRIST FRACTURE SURGERY Left 1995   Social History   Occupational History  . Occupation: unemployed  Tobacco Use  . Smoking status: Current Every Day Smoker    Packs/day: 1.50    Years: 40.00    Pack years: 60.00    Types: Cigarettes    Start date: 38  . Smokeless tobacco: Never Used  Vaping Use  . Vaping Use: Former  Substance and Sexual Activity  . Alcohol use: Yes    Comment: once a month - 3-4 mixed drinks  . Drug use: Yes    Types: Marijuana    Comment: daily use  . Sexual activity: Yes    Partners: Male    Birth control/protection: Surgical

## 2019-11-21 NOTE — Patient Instructions (Signed)
   Glucosamine Sulfate:  1,000 mg twice daily  Turmeric:  500 mg twice daily   

## 2019-11-26 ENCOUNTER — Telehealth (INDEPENDENT_AMBULATORY_CARE_PROVIDER_SITE_OTHER): Payer: Medicare HMO | Admitting: Psychiatry

## 2019-11-26 ENCOUNTER — Other Ambulatory Visit: Payer: Self-pay

## 2019-11-26 ENCOUNTER — Encounter (HOSPITAL_COMMUNITY): Payer: Self-pay | Admitting: Psychiatry

## 2019-11-26 DIAGNOSIS — F331 Major depressive disorder, recurrent, moderate: Secondary | ICD-10-CM

## 2019-11-26 DIAGNOSIS — F901 Attention-deficit hyperactivity disorder, predominantly hyperactive type: Secondary | ICD-10-CM | POA: Diagnosis not present

## 2019-11-26 MED ORDER — AMPHETAMINE-DEXTROAMPHETAMINE 30 MG PO TABS
30.0000 mg | ORAL_TABLET | Freq: Two times a day (BID) | ORAL | 0 refills | Status: DC
Start: 2019-11-26 — End: 2020-02-25

## 2019-11-26 MED ORDER — AMPHETAMINE-DEXTROAMPHETAMINE 30 MG PO TABS: 30.0000 mg | ORAL_TABLET | Freq: Two times a day (BID) | ORAL | 0 refills | Status: DC

## 2019-11-26 MED ORDER — AMPHETAMINE-DEXTROAMPHETAMINE 30 MG PO TABS
30.0000 mg | ORAL_TABLET | Freq: Two times a day (BID) | ORAL | 0 refills | Status: DC
Start: 2019-11-26 — End: 2020-03-26

## 2019-11-26 MED ORDER — DULOXETINE HCL 60 MG PO CPEP: ORAL_CAPSULE | ORAL | 2 refills | Status: DC

## 2019-11-26 NOTE — Progress Notes (Signed)
Virtual Visit via Telephone Note  I connected with [name] on 11/26/19 at  9:20 AM EDT by telephone and verified that I am speaking with the correct person using two identifiers.   I discussed the limitations, risks, security and privacy concerns of performing an evaluation and management service by telephone and the availability of in person appointments. I also discussed with the patient that there may be a patient responsible charge related to this service. The patient expressed understanding and agreed to proceed.    I discussed the assessment and treatment plan with the patient. The patient was provided an opportunity to ask questions and all were answered. The patient agreed with the plan and demonstrated an understanding of the instructions.   The patient was advised to call back or seek an in-person evaluation if the symptoms worsen or if the condition fails to improve as anticipated.  I provided 15 minutes of non-face-to-face time during this encounter. Location: Provider Home, patient home  Sheila Spiller, MD  [facility] MD/PA/NP OP Progress Note  11/26/2019 9:35 AM [name]  MRN:  [id number]  Chief Complaint:  Chief Complaint    Depression; Anxiety; ADD; Follow-up     HPI: This patient is a 54 year old married female who lives with her husband in [place].  Currently her daughter and her 3 children are also living there.  She is on disability.  The patient returns for follow-up after 2 months.  She states that her daughter gave birth to a baby boy in June.  Her daughter has also suffered from severe depression and was not doing very well with getting prenatal care.  Fortunately the baby has been okay.  Her daughter is still struggling with the depression even though she was started on Lexapro.  I did let her know that we do now have a behavioral health urgent care in [place] that her daughter could attend and she will look into this.  As for  herself she has been doing fairly well.  She had a cardiac catheterization recently but did not require any stents.  Her mood has generally been pretty good.  She denies severe depression but does have anxiety worrying about her daughter.  She is sleeping well.  The Adderall continues to help with her focus.  She denies any thoughts of self-harm or suicide Visit Diagnosis:    ICD-10-CM   1. Attention deficit hyperactivity disorder (ADHD), predominantly hyperactive type  F90.1   2. Moderate episode of recurrent major depressive disorder ([place])  F33.1     Past Psychiatric History: She was hospitalized twice in her 36s, again in 2006 and in November 2020 for drug overdose with suicidal intent.  Past Medical History:  Past Medical History:  Diagnosis Date  . ADHD   . Alcohol abuse   . Anemia   . Anxiety   . Asthmatic bronchitis    normal PFT/ seen by pulmonary no evidence of COPD  . Atypical chest pain 10/05/2017  . Back pain   . Bilateral swelling of feet   . Chest pain   . Chest pain on respiration 03/25/2014  . Chronic bronchitis ([place])   . Chronic lower back pain   . Chronic respiratory failure ([place]) 10/16/2011   Newly 02 dep 24/7 p discharge from [facility] 01/2013  - 03/13/2014  Walked RA  2 laps @ 185 ft each stopped due to  Sob/ aching in legs, thirsty/ no desat @ slow pace   . Chronic respiratory failure with  hypoxia ([place])    On 2-3 L of oxygen at home  . Cigarette smoker 12/02/2010   Followed in Pulmonary clinic/ [facility]/ [place]   - Limits of effective care reviewed 12/22/2011    . Complication of anesthesia   . Constipation   . COPD (chronic obstructive pulmonary disease) ([place])   . COPD with chronic bronchitis ([place]) 10/01/2017  . Daily headache   . Depression   . Diabetic peripheral neuropathy ([place])   . DM (diabetes mellitus) type II controlled, neurological manifestation ([place]) 12/02/2010  . Drug use   . Gallbladder problem   . Gastric erosions    EGD 08/2010.  [name] GERD  (gastroesophageal reflux disease)   . Glaucoma   . H/O drug abuse ([place]) 11/12/2017   -- scanned document from outside source: [facility] in [place] which showed UDS positive for Adderall /amphetamine usage as well as positive urine for THC.  This test result was collected 08/11/2016 and reported 10/07/2016. - lso review of the chart shows another positive amphetamine, THC and METH in the urine back on 10/18/2015 under "care everywhere". ---So   . Heavy menses   . High cholesterol   . History of blood transfusion    "related to low HgB" (10/05/2017)  . History of hiatal hernia   . HTN (hypertension)   . Hyperlipidemia associated with type 2 diabetes mellitus ([place]) 10/18/2015  . Hypertension associated with diabetes ([place]) 12/02/2010   D/c acei 12/22/2011 due to psuedowheeze and narcotic dependent cough> ? Improved - 12/87/8676 started bystolic in place of cozar due to cough    . IBS (irritable bowel syndrome)   . Increased urinary protein excretion   . Internal hemorrhoids    Colonoscopy 5/12.  . Iron deficiency anemia 10/01/2017  . Joint pain   . Mixed diabetic hyperlipidemia associated with type 2 diabetes mellitus ([place]) 10/01/2017  . On home oxygen therapy    "5L at night" (10/05/2017)  . OSA on CPAP   . Osteoarthritis    "back" (10/05/2017)  . Oxygen dependent 10/16/2011  . Pneumonia    "lots of times" (10/05/2017)  . PONV (postoperative nausea and vomiting)   . Poorly controlled diabetes mellitus ([place]) 11/12/2017  . Pulmonary infiltrates 12/22/2011   Followed in Pulmonary clinic/ [facility]/ [place]    - See CT Chest 05/05/11   . Shortness of breath   . Tachycardia    never had test done since no insurance  . Tobacco use disorder-current smoker greater than 40-pack-year history- since age 25 1 ppd 10/01/2017  . Type II diabetes mellitus ([place])   . Vitamin D deficiency     Past Surgical History:  Procedure Laterality Date  . CESAREAN SECTION   1988; 1989  . CHOLECYSTECTOMY OPEN  1990  . COLONOSCOPY  09/16/2010   [facility]:NOBSJG colon/small internal hemorrhoids  . ESOPHAGOGASTRODUODENOSCOPY  09/16/2010   [facility]: normal/mild gastritis  . ESOPHAGOGASTRODUODENOSCOPY N/A 10/19/2014   Procedure: ESOPHAGOGASTRODUODENOSCOPY (EGD);  Surgeon: Danie Binder, MD;  Location: [facility] END[facility] SUITE;  Service: Endoscopy;  Laterality: N/A;  830  . FRACTURE SURGERY    . HYSTEROSCOPY WITH THERMACHOICE  01/17/2012   Procedure: HYSTEROSCOPY WITH THERMACHOICE;  Surgeon: Florian Buff, MD;  Location: [facility] ORS;  Service: Gynecology;  Laterality: N/A;  total therapy time: 9:13sec  D5W  18 ml in, D5W   19m out, temperture 87degrees celcious  . KIDNEY SURGERY     as child for blockages  . LEFT HEART  CATH AND CORONARY ANGIOGRAPHY N/A 09/08/2019   Procedure: LEFT HEART CATH AND CORONARY ANGIOGRAPHY;  Surgeon: Martinique, Peter M, MD;  Location: [facility] CV LAB;  Service: Cardiovascular;  Laterality: N/A;  . TONSILLECTOMY    . TUBAL LIGATION  1989  . TYMPANOSTOMY TUBE PLACEMENT Bilateral    "several times when I was a child"  . uterine ablation    . WRIST FRACTURE SURGERY Left 1995    Family Psychiatric History: see below  Family History:  Family History  Problem Relation Age of Onset  . Heart attack Father 78       deceased, etoh use  . Heart disease Father   . Alcohol abuse Father   . Depression Father   . Heart failure Father   . Sudden death Father   . Heart attack Mother 82       deceased  . Diabetes Mother   . Breast cancer Mother   . Heart failure Mother        oxygen dependence, nonsmoker  . Heart disease Mother   . Depression Mother   . Cancer Mother   . Hypertension Mother   . Hyperlipidemia Mother   . Sudden death Mother   . Sleep apnea Mother   . Obesity Mother   . Liver disease Maternal Aunt 8       died while on liver transplant list  . Heart attack Maternal Grandmother        premature CAD  . Ulcers Sister   . Hypertension Sister   .  Heart failure Sister   . Depression Sister   . Anxiety disorder Sister   . Colon cancer Neg Hx     Social History:  Social History   Socioeconomic History  . Marital status: Married    Spouse name: Not on file  . Number of children: 2  . Years of education: Not on file  . Highest education level: Not on file  Occupational History  . Occupation: unemployed  Tobacco Use  . Smoking status: Current Every Day Smoker    Packs/day: 1.50    Years: 40.00    Pack years: 60.00    Types: Cigarettes    Start date: 30  . Smokeless tobacco: Never Used  Vaping Use  . Vaping Use: Former  Substance and Sexual Activity  . Alcohol use: Yes    Comment: once a month - 3-4 mixed drinks  . Drug use: Yes    Types: Marijuana    Comment: daily use  . Sexual activity: Yes    Partners: Male    Birth control/protection: Surgical  Other Topics Concern  . Not on file  Social History Narrative  . Not on file   Social Determinants of Health   Financial Resource Strain: Low Risk   . Difficulty of Paying Living Expenses: Not hard at all  Food Insecurity: No Food Insecurity  . Worried About Charity fundraiser in the Last Year: Never true  . Ran Out of Food in the Last Year: Never true  Transportation Needs: No Transportation Needs  . Lack of Transportation (Medical): No  . Lack of Transportation (Non-Medical): No  Physical Activity: Inactive  . Days of Exercise per Week: 0 days  . Minutes of Exercise per Session: 0 min  Stress:   . Feeling of Stress :   Social Connections: Moderately Isolated  . Frequency of Communication with Friends and Family: More than three times a week  . Frequency of Social Gatherings  with Friends and Family: More than three times a week  . Attends Religious Services: Never  . Active Member of Clubs or Organizations: No  . Attends Archivist Meetings: Never  . Marital Status: Married    Allergies:  Allergies  Allergen Reactions  . Codeine Itching  and Nausea Only  . Metformin And Related Diarrhea and Other (See Comments)    Stomach pain/nausea  . Wellbutrin [Bupropion Hcl] Hives  . Acyclovir And Related Rash    Metabolic Disorder Labs: Lab Results  Component Value Date   HGBA1C 7.6 (H) 06/30/2019   MPG 188.64 03/05/2019   MPG 160 (H) 03/10/2015   No results found for: PROLACTIN Lab Results  Component Value Date   CHOL 151 06/30/2019   TRIG 98 06/30/2019   HDL 48 06/30/2019   CHOLHDL 3.4 02/12/2018   VLDL 35 10/06/2017   LDLCALC 85 06/30/2019   LDLCALC 74 02/12/2018   Lab Results  Component Value Date   TSH 1.860 06/30/2019   TSH 2.40 11/26/2018    Therapeutic Level Labs: No results found for: LITHIUM No results found for: VALPROATE No components found for:  CBMZ  Current Medications: Current Outpatient Medications  Medication Sig Dispense Refill  . ACCU-CHEK GUIDE test strip USE UP TO FOUR TIMES DAILY AS DIRECTED 200 strip 11  . Accu-Chek Softclix Lancets lancets USE UP TO FOUR TIMES DAILY AS DIRECTED 300 each 0  . albuterol (PROVENTIL) (2.5 MG/3ML) 0.083% nebulizer solution Take 3 mLs (2.5 mg total) by nebulization every 6 (six) hours as needed for wheezing or shortness of breath. 75 mL 12  . albuterol (VENTOLIN HFA) 108 (90 Base) MCG/ACT inhaler Inhale 2 puffs into the lungs every 6 (six) hours as needed for wheezing or shortness of breath.    . amphetamine-dextroamphetamine (ADDERALL) 30 MG tablet Take 1 tablet by mouth 2 (two) times daily. 60 tablet 0  . amphetamine-dextroamphetamine (ADDERALL) 30 MG tablet Take 1 tablet by mouth 2 (two) times daily. 60 tablet 0  . amphetamine-dextroamphetamine (ADDERALL) 30 MG tablet Take 1 tablet by mouth 2 (two) times daily. 60 tablet 0  . aspirin EC 81 MG tablet Take 1 tablet (81 mg total) by mouth daily. 90 tablet 3  . atorvastatin (LIPITOR) 40 MG tablet Take 1 tablet (40 mg total) by mouth daily. 30 tablet 2  . baclofen (LIORESAL) 10 MG tablet Take 0.5-1 tablets (5-10  mg total) by mouth 3 (three) times daily as needed for muscle spasms. 30 each 3  . blood glucose meter kit and supplies Dispense based on patient and insurance preference. Use up to four times daily as directed. (FOR ICD-10 E10.9, E11.9). 1 each 0  . Cholecalciferol (VITAMIN D-3) 125 MCG (5000 UT) TABS Take 5,000 Units by mouth daily.    . ciprofloxacin-dexamethasone (CIPRODEX) OTIC suspension Place 4-5 drops into the right ear 2 (two) times daily as needed (drainage).    . cycloSPORINE (RESTASIS) 0.05 % ophthalmic emulsion Place 1 drop into both eyes 2 (two) times daily.    . DULoxetine (CYMBALTA) 60 MG capsule TAKE 1 CAPSULE(60 MG) BY MOUTH AT BEDTIME 30 capsule 2  . ezetimibe (ZETIA) 10 MG tablet Take 1 tablet (10 mg total) by mouth daily. 90 tablet 3  . fluticasone (FLONASE) 50 MCG/ACT nasal spray USE 1 SPRAY IN EACH NOSTRIL TWICE DAILY AFTER SINUS RINSES 16 g 2  . furosemide (LASIX) 40 MG tablet AS NEEDED for weight increase of 3 lbs overnight or 5 lbs in 1 week  90 tablet 3  . ibuprofen (ADVIL) 200 MG tablet Take 200 mg by mouth every 6 (six) hours as needed.    . Insulin Pen Needle (BD PEN NEEDLE NANO U/F) 32G X 4 MM MISC USE AS DIRECTED WITH VICTOZA 100 each 11  . INVOKANA 300 MG TABS tablet TAKE 1 TABLET(300 MG) BY MOUTH DAILY BEFORE BREAKFAST (Patient taking differently: Take 300 mg by mouth daily before breakfast. ) 90 tablet 1  . isosorbide mononitrate (IMDUR) 30 MG 24 hr tablet TAKE 1 TABLET BY MOUTH EVERY DAY (Patient taking differently: Take 30 mg by mouth daily. ) 90 tablet 3  . Lancets Misc. (ACCU-CHEK SOFTCLIX LANCET DEV) KIT Check Blood sugar up to 4 times a day 1 kit 0  . liraglutide (VICTOZA) 18 MG/3ML SOPN ADMINISTER 1.8 MG UNDER THE SKIN DAILY 27 mL 1  . losartan (COZAAR) 25 MG tablet Take 1 tablet (25 mg total) by mouth daily. 90 tablet 3  . meloxicam (MOBIC) 15 MG tablet Take 1 tablet (15 mg total) by mouth daily. 90 tablet 0  . metoprolol tartrate (LOPRESSOR) 25 MG tablet  Take 1 tablet (25 mg total) by mouth 2 (two) times daily. 180 tablet 3  . Multiple Vitamins-Minerals (MULTIVITAMIN WITH MINERALS) tablet Take 1 tablet by mouth daily.    . nabumetone (RELAFEN) 500 MG tablet Take 1 tablet (500 mg total) by mouth 2 (two) times daily as needed. 60 tablet 3  . nitroGLYCERIN (NITROSTAT) 0.4 MG SL tablet Place 1 tablet (0.4 mg total) under the tongue every 5 (five) minutes as needed for chest pain. 25 tablet 3  . pantoprazole (PROTONIX) 40 MG tablet TAKE 1 TABLET BY MOUTH EVERY DAY 30 MINUTES BEFORE A MEAL 90 tablet 3  . pregabalin (LYRICA) 150 MG capsule TAKE 1 CAPSULE BY MOUTH TWICE DAILY 180 capsule 3   Current Facility-Administered Medications  Medication Dose Route Frequency Provider Last Rate Last Admin  . sodium chloride flush (NS) 0.9 % injection 3 mL  3 mL Intravenous Q12H Donato Heinz, MD         Musculoskeletal: Strength & Muscle Tone: within normal limits Gait & Station: normal Patient leans: N/A  Psychiatric Specialty Exam: Review of Systems  Musculoskeletal: Positive for arthralgias.  All other systems reviewed and are negative.   Last menstrual period 08/01/2014.There is no height or weight on file to calculate BMI.  General Appearance: NA  Eye Contact: NA  Speech:  Clear and Coherent  Volume:  Normal  Mood:  Euthymic  Affect:  NA  Thought Process:  Goal Directed  Orientation:  Full (Time, Place, and Person)  Thought Content: Rumination   Suicidal Thoughts:  No  Homicidal Thoughts:  No  Memory:  Immediate;   Good Recent;   Good Remote;   Fair  Judgement:  Good  Insight:  Good  Psychomotor Activity:  Normal  Concentration:  Concentration: Fair and Attention Span: Fair  Recall:  Good  Fund of Knowledge: Good  Language: Good  Akathisia:  No  Handed:  Right  AIMS (if indicated): not done  Assets:  Communication Skills Desire for Improvement Resilience Social Support Talents/Skills  ADL's:  Intact  Cognition: WNL   Sleep:  Good   Screenings: AIMS     Admission (Discharged) from 03/07/2019 in [place] Admission (Discharged) from 09/12/2014 in [facility] [place] [facility]  AIMS Total Score 0 0    AUDIT     Admission (Discharged) from 03/07/2019 in [place]  BEHAVIORAL MEDICINE Admission (Discharged) from 09/12/2014 in [place] [facility]  Alcohol Use Disorder Identification Test Final Score (AUDIT) 0 0    GAD-7     Office Visit from 11/06/2019 in [place]  Total GAD-7 Score 5    PHQ2-9     Office Visit from 11/06/2019 in [place] from 10/30/2019 in [place] Visit from 06/30/2019 in [place] Office Visit from 06/20/2019 in [place] Visit from 09/24/2018 in [place] at [facility] Total Score 0 0 5 0 2  PHQ-9 Total Score 6 -- 17 -- 9       Assessment and Plan: Patient is a 54 year old female with a history of depression anxiety and ADD.  She continues to do well on her current medications.  She will continue Cymbalta 60 mg daily for depression and Adderall 30 mg twice daily for ADD.  She will return to see me in 3 months   Sheila Spiller, MD 11/26/2019, 9:35 AM

## 2019-12-14 DIAGNOSIS — G4733 Obstructive sleep apnea (adult) (pediatric): Secondary | ICD-10-CM | POA: Diagnosis not present

## 2020-01-08 ENCOUNTER — Encounter: Payer: Self-pay | Admitting: Cardiology

## 2020-01-14 DIAGNOSIS — G4733 Obstructive sleep apnea (adult) (pediatric): Secondary | ICD-10-CM | POA: Diagnosis not present

## 2020-01-25 ENCOUNTER — Other Ambulatory Visit: Payer: Self-pay | Admitting: Physician Assistant

## 2020-01-26 NOTE — Telephone Encounter (Signed)
This is Dr. Newman Nickels pt, Dr. Tamala Julian has not seen this pt. Please address

## 2020-01-30 ENCOUNTER — Ambulatory Visit: Payer: Medicare HMO | Admitting: Interventional Cardiology

## 2020-02-02 ENCOUNTER — Ambulatory Visit: Payer: Medicare HMO | Admitting: Cardiology

## 2020-02-03 ENCOUNTER — Other Ambulatory Visit: Payer: Self-pay | Admitting: Family Medicine

## 2020-02-03 DIAGNOSIS — M5442 Lumbago with sciatica, left side: Secondary | ICD-10-CM

## 2020-02-03 DIAGNOSIS — G8929 Other chronic pain: Secondary | ICD-10-CM

## 2020-02-03 NOTE — Telephone Encounter (Signed)
Received a refill request for: Meloxicam LR 11/06/19, #90, 0 rf LOV 11/06/19  Pt has referral. Please review and advise.   Thanks.  Dm/cma

## 2020-02-04 NOTE — Telephone Encounter (Signed)
Pt looks to have an active Rx for relafen as well as meloxicam. She should not be taking both. Please confirm what pt is taking before I refill meloxicam. Thanks

## 2020-02-04 NOTE — Telephone Encounter (Signed)
lft VM to rtn call. Dm/cma  

## 2020-02-06 NOTE — Telephone Encounter (Signed)
Patient is taking the Relafen and Baclofen as needed.  Advised that the meloicam will not be filled.  Denied the Rx with the pharmacy. Dm/cma

## 2020-02-13 ENCOUNTER — Other Ambulatory Visit: Payer: Self-pay | Admitting: Family Medicine

## 2020-02-13 DIAGNOSIS — G4733 Obstructive sleep apnea (adult) (pediatric): Secondary | ICD-10-CM | POA: Diagnosis not present

## 2020-02-16 NOTE — Telephone Encounter (Signed)
Last OV 11/06/19 Last fill 11/11/19 #16g/2

## 2020-02-19 ENCOUNTER — Other Ambulatory Visit: Payer: Self-pay

## 2020-02-19 ENCOUNTER — Encounter: Payer: Self-pay | Admitting: Emergency Medicine

## 2020-02-19 ENCOUNTER — Ambulatory Visit (INDEPENDENT_AMBULATORY_CARE_PROVIDER_SITE_OTHER): Payer: Medicare HMO

## 2020-02-19 ENCOUNTER — Ambulatory Visit
Admission: EM | Admit: 2020-02-19 | Discharge: 2020-02-19 | Disposition: A | Discharge status: Home / Self Care | Payer: Medicare HMO | Attending: Emergency Medicine | Admitting: Emergency Medicine

## 2020-02-19 DIAGNOSIS — R0602 Shortness of breath: Secondary | ICD-10-CM | POA: Diagnosis not present

## 2020-02-19 DIAGNOSIS — R059 Cough, unspecified: Secondary | ICD-10-CM

## 2020-02-19 DIAGNOSIS — R0600 Dyspnea, unspecified: Secondary | ICD-10-CM

## 2020-02-19 MED ORDER — GUAIFENESIN-DM 100-10 MG/5ML PO SYRP: 5.0000 mL | ORAL_SOLUTION | ORAL | 0 refills | Status: DC | PRN

## 2020-02-19 MED ORDER — PREDNISONE 50 MG PO TABS: 50.0000 mg | ORAL_TABLET | Freq: Every day | ORAL | 0 refills | Status: DC

## 2020-02-19 MED ORDER — BENZONATATE 100 MG PO CAPS: 100.0000 mg | ORAL_CAPSULE | Freq: Three times a day (TID) | ORAL | 0 refills | Status: DC

## 2020-02-19 MED ORDER — DOXYCYCLINE HYCLATE 100 MG PO CAPS: 100.0000 mg | ORAL_CAPSULE | Freq: Two times a day (BID) | ORAL | 0 refills | Status: DC

## 2020-02-19 NOTE — Discharge Instructions (Addendum)

## 2020-02-19 NOTE — ED Triage Notes (Signed)
Pt c/o a cough for over 2wks and now getting worse. States her o2 levels has been dropping to 86. Pt c/o dizziness with position change x2-3 days. Pt also c/o lt lower dental pain.

## 2020-02-19 NOTE — ED Provider Notes (Signed)
[facility]    CSN: [id number] Arrival date & time: 02/19/20  1346      History   Chief Complaint Chief Complaint  Patient presents with  . Cough    HPI [name] is a 54 y.o. female  Subjective:   [name] is a 54 y.o. female here for evaluation of a cough.  The cough is without wheezing, dyspnea or hemoptysis, productive of clear sputum, productive of green/yellow sputum, with shortness of breath during the cough, worsening over time and is aggravated by cold air, dust, exercise and pollens. Onset of symptoms was 2 weeks ago, gradually worsening since that time.  Associated symptoms include shortness of breath, sputum. Patient does have a history of asthma, COPD, O2 supplementation. Patient has not had recent travel. Patient does have a history of smoking. Patient  has not had a previous chest x-ray. Patient has not had a PPD done. The following portions of the patient's history were reviewed and updated as appropriate: allergies, current medications, past family history, past medical history, past social history, past surgical history and problem list.      Past Medical History:  Diagnosis Date  . ADHD   . Alcohol abuse   . Anemia   . Anxiety   . Asthmatic bronchitis    normal PFT/ seen by pulmonary no evidence of COPD  . Atypical chest pain 10/05/2017  . Back pain   . Bilateral swelling of feet   . Chest pain   . Chest pain on respiration 03/25/2014  . Chronic bronchitis ([place])   . Chronic lower back pain   . Chronic respiratory failure ([place]) 10/16/2011   Newly 02 dep 24/7 p discharge from [facility] 01/2013  - 03/13/2014  Walked RA  2 laps @ 185 ft each stopped due to  Sob/ aching in legs, thirsty/ no desat @ slow pace   . Chronic respiratory failure with hypoxia (HCC)    On 2-3 L of oxygen at home  . Cigarette smoker 12/02/2010   Followed in Pulmonary clinic/ [facility]/ [place]   - Limits of effective care reviewed 12/22/2011     . Complication of anesthesia   . Constipation   . COPD (chronic obstructive pulmonary disease) ([place])   . COPD with chronic bronchitis ([place]) 10/01/2017  . Daily headache   . Depression   . Diabetic peripheral neuropathy ([place])   . DM (diabetes mellitus) type II controlled, neurological manifestation ([place]) 12/02/2010  . Drug use   . Gallbladder problem   . Gastric erosions    EGD 08/2010.  [name] GERD (gastroesophageal reflux disease)   . Glaucoma   . H/O drug abuse ([place]) 11/12/2017   -- scanned document from outside source: [facility] in [place] which showed UDS positive for Adderall /amphetamine usage as well as positive urine for THC.  This test result was collected 08/11/2016 and reported 10/07/2016. - lso review of the chart shows another positive amphetamine, THC and METH in the urine back on 10/18/2015 under "care everywhere". ---So   . Heavy menses   . High cholesterol   . History of blood transfusion    "related to low HgB" (10/05/2017)  . History of hiatal hernia   . HTN (hypertension)   . Hyperlipidemia associated with type 2 diabetes mellitus ([place]) 10/18/2015  . Hypertension associated with diabetes ([employer]) 12/02/2010   D/c acei 12/22/2011 due to psuedowheeze and narcotic dependent cough> ? Improved - 63/14/9702 started bystolic  in place of cozar due to cough    . IBS (irritable bowel syndrome)   . Increased urinary protein excretion   . Internal hemorrhoids    Colonoscopy 5/12.  . Iron deficiency anemia 10/01/2017  . Joint pain   . Mixed diabetic hyperlipidemia associated with type 2 diabetes mellitus ([place]) 10/01/2017  . On home oxygen therapy    "5L at night" (10/05/2017)  . OSA on CPAP   . Osteoarthritis    "back" (10/05/2017)  . Oxygen dependent 10/16/2011  . Pneumonia    "lots of times" (10/05/2017)  . PONV (postoperative nausea and vomiting)   . Poorly controlled diabetes mellitus ([place]) 11/12/2017  . Pulmonary infiltrates 12/22/2011    Followed in Pulmonary clinic/ [facility]/ [place]    - See CT Chest 05/05/11   . Shortness of breath   . Tachycardia    never had test done since no insurance  . Tobacco use disorder-current smoker greater than 40-pack-year history- since age 12 1 ppd 10/01/2017  . Type II diabetes mellitus ([place])   . Vitamin D deficiency     Patient Active Problem List   Diagnosis Date Noted  . Abnormal CXR 10/18/2019  . COPD  GOLD 0 / CB features/ still smoking 10/17/2019  . Angina pectoris ([place]) 09/08/2019  . No-show for appointment 08/26/2019  . MDD (major depressive disorder), severe ([place]) 03/07/2019  . Benzodiazepine overdose 03/05/2019  . Generalized muscle ache 09/24/2018  . Personal history of noncompliance with medical treatment, presenting hazards to health 09/24/2018  . SOB (shortness of breath) 07/09/2018  . Fever, low grade 07/09/2018  . Cough in adult 07/09/2018  . Diabetes mellitus ([place]) 03/11/2018  . Chronic right ear pain 03/11/2018  . Chronic sinusitis 03/11/2018  . H/O drug abuse ([place]) 11/12/2017  . Positive for macroalbuminuria 11/12/2017  . h/o freq Vaginal yeast infections 11/12/2017  . Poorly controlled diabetes mellitus ([place]) 11/12/2017  . Atypical chest pain 10/05/2017  . COPD with chronic bronchitis ([place]) 10/01/2017  . Mixed diabetic hyperlipidemia associated with type 2 diabetes mellitus ([place]) 10/01/2017  . Tobacco use disorder-current smoker greater than 40-pack-year history- since age 43 1 ppd 10/01/2017  . Tobacco abuse counseling 10/01/2017  . Diabetic peripheral neuropathy ([place]) 10/01/2017  . Iron deficiency anemia 10/01/2017  . Adult attention deficit hyperactivity disorder 01/14/2016  . Gastric reflux 10/18/2015  . Major depressive disorder, recurrent severe without psychotic features ([place])   . Nasal congestion 05/03/2014  . Chest pain on respiration 03/25/2014  . Leukocytosis 03/25/2014  . Upper airway cough syndrome 02/28/2014  . Abnormal drug screen  04/11/2013  . OSA on CPAP 04/11/2013  . Perforated ear drum 05/22/2012  . Bell's palsy 01/23/2012  . Carpal tunnel syndrome 01/23/2012  . Carpal tunnel syndrome on both sides 01/11/2012  . Chronic respiratory failure ([place]) 10/16/2011  . Oxygen dependent 10/16/2011  . Peripheral neuropathy 07/07/2011  . Vitamin D deficiency 07/06/2011  . Asthmatic bronchitis 06/11/2011  . Back pain 06/11/2011  . Morbid obesity ([place]) 05/06/2011  . Depression 05/04/2011  . Gastric erosions 12/05/2010  . Hypertension associated with diabetes ([place]) 12/02/2010  . Diabetes mellitus, type II ([place]) 12/02/2010  . Cigarette smoker 12/02/2010  . COPD with acute exacerbation ([place]) 11/30/2010  . Microcytic anemia 09/08/2010  . GERD (gastroesophageal reflux disease) 09/08/2010  . Esophageal dysphagia 09/08/2010    Past Surgical History:  Procedure Laterality Date  . CESAREAN SECTION  1988; 1989  . CHOLECYSTECTOMY OPEN  1990  . COLONOSCOPY  09/16/2010  EHM:CNOBSJ colon/small internal hemorrhoids  . ESOPHAGOGASTRODUODENOSCOPY  09/16/2010   SLF: normal/mild gastritis  . ESOPHAGOGASTRODUODENOSCOPY N/A 10/19/2014   Procedure: ESOPHAGOGASTRODUODENOSCOPY (EGD);  Surgeon: Danie Binder, MD;  Location: [facility];  Service: Endoscopy;  Laterality: N/A;  830  . FRACTURE SURGERY    . HYSTEROSCOPY WITH THERMACHOICE  01/17/2012   Procedure: HYSTEROSCOPY WITH THERMACHOICE;  Surgeon: Florian Buff, MD;  Location: [facility] ORS;  Service: Gynecology;  Laterality: N/A;  total therapy time: 9:13sec  D5W  18 ml in, D5W   76m out, temperture 87degrees celcious  . KIDNEY SURGERY     as child for blockages  . LEFT HEART CATH AND CORONARY ANGIOGRAPHY N/A 09/08/2019   Procedure: LEFT HEART CATH AND CORONARY ANGIOGRAPHY;  Surgeon: JMartinique Peter M, MD;  [facility];  Service: Cardiovascular;  Laterality: N/A;  . TONSILLECTOMY    . TUBAL LIGATION  1989  . TYMPANOSTOMY TUBE PLACEMENT Bilateral    "several times when I  was a child"  . uterine ablation    . WRIST FRACTURE SURGERY Left 1995    OB History    Gravida  2   Para  2   Term  2   Preterm      AB      Living  2     SAB      TAB      Ectopic      Multiple      Live Births               Home Medications    Prior to Admission medications   Medication Sig Start Date End Date Taking? Authorizing Provider  ACCU-CHEK GUIDE test strip USE UP TO FOUR TIMES DAILY AS DIRECTED 08/18/19   Cirigliano, Mary K, DO  Accu-Chek Softclix Lancets lancets USE UP TO FOUR TIMES DAILY AS DIRECTED 06/20/19   Cirigliano, Mary K, DO  albuterol (PROVENTIL) (2.5 MG/3ML) 0.083% nebulizer solution Take 3 mLs (2.5 mg total) by nebulization every 6 (six) hours as needed for wheezing or shortness of breath. 02/21/19   BMalvin Johns MD  albuterol (VENTOLIN HFA) 108 (90 Base) MCG/ACT inhaler Inhale 2 puffs into the lungs every 6 (six) hours as needed for wheezing or shortness of breath.    [provider]  amphetamine-dextroamphetamine (ADDERALL) 30 MG tablet Take 1 tablet by mouth 2 (two) times daily. 11/26/19 11/25/20  RCloria Spring MD  amphetamine-dextroamphetamine (ADDERALL) 30 MG tablet Take 1 tablet by mouth 2 (two) times daily. 11/26/19 11/25/20  RCloria Spring MD  amphetamine-dextroamphetamine (ADDERALL) 30 MG tablet Take 1 tablet by mouth 2 (two) times daily. 11/26/19 11/25/20  RCloria Spring MD  aspirin EC 81 MG tablet Take 1 tablet (81 mg total) by mouth daily. 12/07/17   Dunn, DNedra Hai PA-C  atorvastatin (LIPITOR) 40 MG tablet Take 1 tablet (40 mg total) by mouth daily. 06/20/19   Cirigliano, MGarvin Fila DO  baclofen (LIORESAL) 10 MG tablet Take 0.5-1 tablets (5-10 mg total) by mouth 3 (three) times daily as needed for muscle spasms. 11/21/19   Hilts, MLegrand Como MD  benzonatate (TESSALON) 100 MG capsule Take 1 capsule (100 mg total) by mouth every 8 (eight) hours. 02/19/20   Hall-Potvin, [place] PA-C  blood glucose meter kit and supplies Dispense based  on patient and insurance preference. Use up to four times daily as directed. (FOR ICD-10 E10.9, E11.9). 06/20/19   Cirigliano, MGarvin Fila DO  Cholecalciferol (VITAMIN D-3) 125 MCG (5000  UT) TABS Take 5,000 Units by mouth daily.    [provider]  ciprofloxacin-dexamethasone (CIPRODEX) OTIC suspension Place 4-5 drops into the right ear 2 (two) times daily as needed (drainage).    [provider]  cycloSPORINE (RESTASIS) 0.05 % ophthalmic emulsion Place 1 drop into both eyes 2 (two) times daily.    [provider]  doxycycline (VIBRAMYCIN) 100 MG capsule Take 1 capsule (100 mg total) by mouth 2 (two) times daily. 02/19/20   Hall-Potvin, [place], PA-C  DULoxetine (CYMBALTA) 60 MG capsule TAKE 1 CAPSULE(60 MG) BY MOUTH AT BEDTIME 11/26/19   Cloria Spring, MD  ezetimibe (ZETIA) 10 MG tablet Take 1 tablet (10 mg total) by mouth daily. 09/03/19 12/02/19  Donato Heinz, MD  fluticasone (FLONASE) 50 MCG/ACT nasal spray INSTILL 1 SPRAY INTO EACH NOSTRIL TWICE DAILY AFTER SINUS RINSES 02/16/20   Cirigliano, Mary K, DO  furosemide (LASIX) 40 MG tablet AS NEEDED for weight increase of 3 lbs overnight or 5 lbs in 1 week 10/29/19   Donato Heinz, MD  guaiFENesin-dextromethorphan (ROBITUSSIN DM) 100-10 MG/5ML syrup Take 5 mLs by mouth every 4 (four) hours as needed for cough. 02/19/20   Hall-Potvin, [place], PA-C  ibuprofen (ADVIL) 200 MG tablet Take 200 mg by mouth every 6 (six) hours as needed.    [provider]  Insulin Pen Needle (BD PEN NEEDLE NANO U/F) 32G X 4 MM MISC USE AS DIRECTED WITH VICTOZA 06/16/19   Cirigliano, Mary K, DO  INVOKANA 300 MG TABS tablet TAKE 1 TABLET(300 MG) BY MOUTH DAILY BEFORE BREAKFAST Patient taking differently: Take 300 mg by mouth daily before breakfast.  09/03/19   Cirigliano, Garvin Fila, DO  isosorbide mononitrate (IMDUR) 30 MG 24 hr tablet TAKE 1 TABLET BY MOUTH EVERY DAY 01/27/20   Dunn, Nedra Hai, PA-C  Lancets Misc. (ACCU-CHEK  SOFTCLIX LANCET DEV) KIT Check Blood sugar up to 4 times a day 06/20/19   Cirigliano, Stanton Kidney K, DO  liraglutide (VICTOZA) 18 MG/3ML SOPN ADMINISTER 1.8 MG UNDER THE SKIN DAILY 11/11/19   Cirigliano, Mary K, DO  losartan (COZAAR) 25 MG tablet Take 1 tablet (25 mg total) by mouth daily. 12/11/18   Cirigliano, Garvin Fila, DO  meloxicam (MOBIC) 15 MG tablet Take 1 tablet (15 mg total) by mouth daily. 11/06/19   Cirigliano, Garvin Fila, DO  metoprolol tartrate (LOPRESSOR) 25 MG tablet Take 1 tablet (25 mg total) by mouth 2 (two) times daily. 09/03/19 12/02/19  Donato Heinz, MD  Multiple Vitamins-Minerals (MULTIVITAMIN WITH MINERALS) tablet Take 1 tablet by mouth daily.    [provider]  nabumetone (RELAFEN) 500 MG tablet Take 1 tablet (500 mg total) by mouth 2 (two) times daily as needed. 11/21/19   Hilts, Legrand Como, MD  nitroGLYCERIN (NITROSTAT) 0.4 MG SL tablet Place 1 tablet (0.4 mg total) under the tongue every 5 (five) minutes as needed for chest pain. 09/03/19 12/02/19  Donato Heinz, MD  pantoprazole (PROTONIX) 40 MG tablet TAKE 1 TABLET BY MOUTH EVERY DAY 30 MINUTES BEFORE A MEAL 10/29/19   Cirigliano, Garvin Fila, DO  predniSONE (DELTASONE) 50 MG tablet Take 1 tablet (50 mg total) by mouth daily with breakfast. 02/19/20   Hall-Potvin, [place], PA-C  pregabalin (LYRICA) 150 MG capsule TAKE 1 CAPSULE BY MOUTH TWICE DAILY 10/15/19   Ronnald Nian, DO    Family History Family History  Problem Relation Age of Onset  . Heart attack Father 79       deceased,  etoh use  . Heart disease Father   . Alcohol abuse Father   . Depression Father   . Heart failure Father   . Sudden death Father   . Heart attack Mother 75       deceased  . Diabetes Mother   . Breast cancer Mother   . Heart failure Mother        oxygen dependence, nonsmoker  . Heart disease Mother   . Depression Mother   . Cancer Mother   . Hypertension Mother   . Hyperlipidemia Mother   . Sudden death Mother   . Sleep  apnea Mother   . Obesity Mother   . Liver disease Maternal Aunt 5       died while on liver transplant list  . Heart attack Maternal Grandmother        premature CAD  . Ulcers Sister   . Hypertension Sister   . Heart failure Sister   . Depression Sister   . Anxiety disorder Sister   . Colon cancer Neg Hx     Social History Social History   Tobacco Use  . Smoking status: Current Every Day Smoker    Packs/day: 1.50    Years: 40.00    Pack years: 60.00    Types: Cigarettes    Start date: 42  . Smokeless tobacco: Never Used  Vaping Use  . Vaping Use: Former  Substance Use Topics  . Alcohol use: Yes    Comment: once a month - 3-4 mixed drinks  . Drug use: Yes    Types: Marijuana    Comment: daily use     Allergies   Codeine, Metformin and related, Wellbutrin [bupropion hcl], and Acyclovir and related   Review of Systems Review of Systems  Constitutional: Negative for fatigue and fever.  HENT: Negative for ear pain, sinus pain, sore throat and voice change.   Eyes: Negative for pain, redness and visual disturbance.  Respiratory: Positive for cough, chest tightness and shortness of breath. Negative for apnea, choking, wheezing and stridor.   Cardiovascular: Negative for chest pain, palpitations and leg swelling.  Gastrointestinal: Negative for abdominal pain, diarrhea and vomiting.  Musculoskeletal: Negative for arthralgias and myalgias.  Skin: Negative for rash and wound.  Neurological: Negative for syncope and headaches.     Physical Exam Triage Vital Signs ED Triage Vitals  Enc Vitals Group     BP      Pulse      Resp      Temp      Temp src      SpO2      Weight      Height      Head Circumference      Peak Flow      Pain Score      Pain Loc      Pain Edu?      Excl. in [place]?    No data found.  Updated Vital Signs BP 120/79 (BP Location: Left Arm)   Pulse 85   Temp 98 F (36.7 C) (Oral)   Resp 20   LMP 08/01/2014 Comment: irregular  SpO2  94%   Visual Acuity Right Eye Distance:   Left Eye Distance:   Bilateral Distance:    Right Eye Near:   Left Eye Near:    Bilateral Near:     Physical Exam Constitutional:      General: She is not in acute distress.    Appearance: She is not  ill-appearing or diaphoretic.  HENT:     Head: Normocephalic and atraumatic.     Mouth/Throat:     Mouth: Mucous membranes are moist.     Pharynx: Oropharynx is clear. No oropharyngeal exudate or posterior oropharyngeal erythema.  Eyes:     General: No scleral icterus.    Conjunctiva/sclera: Conjunctivae normal.     Pupils: Pupils are equal, round, and reactive to light.  Neck:     Comments: Trachea midline, negative JVD Cardiovascular:     Rate and Rhythm: Normal rate and regular rhythm.     Heart sounds: No murmur heard.  No gallop.   Pulmonary:     Effort: Pulmonary effort is normal. No respiratory distress.     Breath sounds: Rhonchi and rales present. No wheezing.     Comments: Bilateral bases.  Good air entry w/o prolonged exp phase Musculoskeletal:     Cervical back: Neck supple. No tenderness.  Lymphadenopathy:     Cervical: No cervical adenopathy.  Skin:    Capillary Refill: Capillary refill takes less than 2 seconds.     Coloration: Skin is not jaundiced or pale.     Findings: No rash.  Neurological:     General: No focal deficit present.     Mental Status: She is alert and oriented to person, place, and time.      UC Treatments / Results  Labs (all labs ordered are listed, but only abnormal results are displayed) Labs Reviewed - No data to display  EKG   Radiology DG Chest 2 View  Result Date: 02/19/2020 CLINICAL DATA:  Cough and shortness of breath EXAM: CHEST - 2 VIEW COMPARISON:  October 21, 2019 FINDINGS: There is no edema or airspace opacity. The heart size and pulmonary vascularity are normal. No adenopathy. There is aortic atherosclerosis. There is mild degenerative change in the thoracic spine.  IMPRESSION: No edema or consolidation. Cardiac silhouette within normal limits. Aortic Atherosclerosis (ICD10-I70.0). Electronically Signed   By: Bretta Bang III M.D.   On: 02/19/2020 14:28    Procedures Procedures (including critical care time)  Medications Ordered in UC Medications - No data to display  Initial Impression / Assessment and Plan / UC Course  I have reviewed the triage vital signs and the nursing notes.  Pertinent labs & imaging results that were available during my care of the patient were reviewed by me and considered in my medical decision making (see chart for details).     Patient afebrile, nontoxic, with SpO2 94 on RA%.  NAD.  CXR stable/without acute card-pulm dz.  Will treat for bronchitis as below given comorbidities.  Return precautions discussed, patient verbalized understanding and is agreeable to plan. Final Clinical Impressions(s) / UC Diagnoses   Final diagnoses:  Cough  Dyspnea, unspecified type     Discharge Instructions     Tessalon for cough. Start flonase, atrovent nasal spray for nasal congestion/drainage. You can use over the counter nasal saline rinse such as neti pot for nasal congestion. Keep hydrated, your urine should be clear to pale yellow in color. Tylenol/motrin for fever and pain. Monitor for any worsening of symptoms, chest pain, shortness of breath, wheezing, swelling of the throat, go to the emergency department for further evaluation needed.     ED Prescriptions    Medication Sig Dispense Auth. Provider   doxycycline (VIBRAMYCIN) 100 MG capsule Take 1 capsule (100 mg total) by mouth 2 (two) times daily. 20 capsule Hall-Potvin, [place], PA-C   predniSONE (DELTASONE) 50  MG tablet Take 1 tablet (50 mg total) by mouth daily with breakfast. 5 tablet Hall-Potvin, [place], PA-C   benzonatate (TESSALON) 100 MG capsule Take 1 capsule (100 mg total) by mouth every 8 (eight) hours. 21 capsule Hall-Potvin, [place], PA-C    guaiFENesin-dextromethorphan (ROBITUSSIN DM) 100-10 MG/5ML syrup Take 5 mLs by mouth every 4 (four) hours as needed for cough. 118 mL Hall-Potvin, [place], PA-C     PDMP not reviewed this encounter.   Hall-Potvin, [place], [place] 02/19/20 1510

## 2020-02-24 ENCOUNTER — Other Ambulatory Visit: Payer: Self-pay | Admitting: Family Medicine

## 2020-02-24 DIAGNOSIS — E1129 Type 2 diabetes mellitus with other diabetic kidney complication: Secondary | ICD-10-CM

## 2020-02-24 DIAGNOSIS — E1159 Type 2 diabetes mellitus with other circulatory complications: Secondary | ICD-10-CM

## 2020-02-24 DIAGNOSIS — E1143 Type 2 diabetes mellitus with diabetic autonomic (poly)neuropathy: Secondary | ICD-10-CM

## 2020-02-25 ENCOUNTER — Encounter (HOSPITAL_COMMUNITY): Payer: Self-pay | Admitting: Psychiatry

## 2020-02-25 ENCOUNTER — Telehealth (INDEPENDENT_AMBULATORY_CARE_PROVIDER_SITE_OTHER): Payer: Medicare HMO | Admitting: Psychiatry

## 2020-02-25 ENCOUNTER — Other Ambulatory Visit: Payer: Self-pay

## 2020-02-25 DIAGNOSIS — F901 Attention-deficit hyperactivity disorder, predominantly hyperactive type: Secondary | ICD-10-CM

## 2020-02-25 DIAGNOSIS — F331 Major depressive disorder, recurrent, moderate: Secondary | ICD-10-CM

## 2020-02-25 MED ORDER — ARIPIPRAZOLE 2 MG PO TABS: 2.0000 mg | ORAL_TABLET | Freq: Every day | ORAL | 2 refills | Status: DC

## 2020-02-25 MED ORDER — DULOXETINE HCL 60 MG PO CPEP: 60.0000 mg | ORAL_CAPSULE | Freq: Every day | ORAL | 2 refills | Status: DC

## 2020-02-25 MED ORDER — CLONAZEPAM 0.5 MG PO TABS: 0.5000 mg | ORAL_TABLET | Freq: Two times a day (BID) | ORAL | 2 refills | Status: DC

## 2020-02-25 MED ORDER — AMPHETAMINE-DEXTROAMPHETAMINE 30 MG PO TABS: 30.0000 mg | ORAL_TABLET | Freq: Two times a day (BID) | ORAL | 0 refills | Status: DC

## 2020-02-25 NOTE — Progress Notes (Signed)
Virtual Visit via Telephone Note  I connected with [name] on 02/25/20 at  1:00 PM EDT by telephone and verified that I am speaking with the correct person using two identifiers.  Location: Patient: home Provider: home   I discussed the limitations, risks, security and privacy concerns of performing an evaluation and management service by telephone and the availability of in person appointments. I also discussed with the patient that there may be a patient responsible charge related to this service. The patient expressed understanding and agreed to proceed.    I discussed the assessment and treatment plan with the patient. The patient was provided an opportunity to ask questions and all were answered. The patient agreed with the plan and demonstrated an understanding of the instructions.   The patient was advised to call back or seek an in-person evaluation if the symptoms worsen or if the condition fails to improve as anticipated.  I provided 15 minutes of non-face-to-face time during this encounter.   Sheila Spiller, MD  [facility] MD/PA/NP OP Progress Note  02/25/2020 1:26 PM [name]  MRN:  [id number]  Chief Complaint:  Chief Complaint    Depression; Anxiety; ADHD; Follow-up     HPI: This patient is a 54 year old married white female who lives with her husband her daughter and the daughter's 3 children in [place]. She is on disability.  The patient returns for follow-up after 3 months. She states that she has not been doing well lately she has been getting a lot more depressed. Her daughter is still suffering from depression also not doing much to take care of her kids or help around the house. The daughter is often angry and disappointed with her. The patient is taken to staying in her room most of the time and avoiding everything and everyone. She feels like a failure because she cannot help her daughter. I explained to her that her daughter needs  professional help and there is not anything more she can do. In fact when her lease ends in February I suggested that she and her husband get their own place or daughter will have to figure things out for herself.  The patient has had fleeting suicidal thoughts but claims she would never act on them again. She did not want to do this to her husband. Her mood is low her energy is low and she would like something to help with anxiety. I have first suggested going back up on Cymbalta but this caused nausea so instead we will add Abilify for augmentation. We will get her back into therapy with Peggy Bynum. She will also start clonazepam for anxiety. The Adderall generally helps her ADHD but right now she is too depressed to notice. Visit Diagnosis:    ICD-10-CM   1. Moderate episode of recurrent major depressive disorder (HCC)  F33.1   2. Attention deficit hyperactivity disorder (ADHD), predominantly hyperactive type  F90.1     Past Psychiatric History: She was hospitalized twice in her 48s and again in 2006 and in November 2020 for drug overdose with suicidal intent  Past Medical History:  Past Medical History:  Diagnosis Date  . ADHD   . Alcohol abuse   . Anemia   . Anxiety   . Asthmatic bronchitis    normal PFT/ seen by pulmonary no evidence of COPD  . Atypical chest pain 10/05/2017  . Back pain   . Bilateral swelling of feet   . Chest pain   . Chest  pain on respiration 03/25/2014  . Chronic bronchitis ([place])   . Chronic lower back pain   . Chronic respiratory failure ([place]) 10/16/2011   Newly 02 dep 24/7 p discharge from [facility] 01/2013  - 03/13/2014  Walked RA  2 laps @ 185 ft each stopped due to  Sob/ aching in legs, thirsty/ no desat @ slow pace   . Chronic respiratory failure with hypoxia (HCC)    On 2-3 L of oxygen at home  . Cigarette smoker 12/02/2010   Followed in Pulmonary clinic/ [facility]/ [place]   - Limits of effective care reviewed 12/22/2011    . Complication of anesthesia    . Constipation   . COPD (chronic obstructive pulmonary disease) ([place])   . COPD with chronic bronchitis ([place]) 10/01/2017  . Daily headache   . Depression   . Diabetic peripheral neuropathy ([place])   . DM (diabetes mellitus) type II controlled, neurological manifestation ([place]) 12/02/2010  . Drug use   . Gallbladder problem   . Gastric erosions    EGD 08/2010.  [name] GERD (gastroesophageal reflux disease)   . Glaucoma   . H/O drug abuse ([place]) 11/12/2017   -- scanned document from outside source: [facility] in [place] which showed UDS positive for Adderall /amphetamine usage as well as positive urine for THC.  This test result was collected 08/11/2016 and reported 10/07/2016. - lso review of the chart shows another positive amphetamine, THC and METH in the urine back on 10/18/2015 under "care everywhere". ---So   . Heavy menses   . High cholesterol   . History of blood transfusion    "related to low HgB" (10/05/2017)  . History of hiatal hernia   . HTN (hypertension)   . Hyperlipidemia associated with type 2 diabetes mellitus ([place]) 10/18/2015  . Hypertension associated with diabetes ([place]) 12/02/2010   D/c acei 12/22/2011 due to psuedowheeze and narcotic dependent cough> ? Improved - 67/20/9470 started bystolic in place of cozar due to cough    . IBS (irritable bowel syndrome)   . Increased urinary protein excretion   . Internal hemorrhoids    Colonoscopy 5/12.  . Iron deficiency anemia 10/01/2017  . Joint pain   . Mixed diabetic hyperlipidemia associated with type 2 diabetes mellitus ([place]) 10/01/2017  . On home oxygen therapy    "5L at night" (10/05/2017)  . OSA on CPAP   . Osteoarthritis    "back" (10/05/2017)  . Oxygen dependent 10/16/2011  . Pneumonia    "lots of times" (10/05/2017)  . PONV (postoperative nausea and vomiting)   . Poorly controlled diabetes mellitus ([place]) 11/12/2017  . Pulmonary infiltrates 12/22/2011   Followed in Pulmonary clinic/ [facility]/ [place]    - See CT Chest 05/05/11   . Shortness of breath   . Tachycardia    never had test done since no insurance  . Tobacco use disorder-current smoker greater than 40-pack-year history- since age 50 1 ppd 10/01/2017  . Type II diabetes mellitus ([place])   . Vitamin D deficiency     Past Surgical History:  Procedure Laterality Date  . CESAREAN SECTION  1988; 1989  . CHOLECYSTECTOMY OPEN  1990  . COLONOSCOPY  09/16/2010   JGG:EZMOQH colon/small internal hemorrhoids  . ESOPHAGOGASTRODUODENOSCOPY  09/16/2010   SLF: normal/mild gastritis  . ESOPHAGOGASTRODUODENOSCOPY N/A 10/19/2014   Procedure: ESOPHAGOGASTRODUODENOSCOPY (EGD);  Surgeon: Danie Binder, MD;  Location: [facility] SUITE;  Service: Endoscopy;  Laterality: N/A;  830  .  FRACTURE SURGERY    . HYSTEROSCOPY WITH THERMACHOICE  01/17/2012   Procedure: HYSTEROSCOPY WITH THERMACHOICE;  Surgeon: Florian Buff, MD;  Location: [facility] ORS;  Service: Gynecology;  Laterality: N/A;  total therapy time: 9:13sec  D5W  18 ml in, D5W   2m out, temperture 87degrees celcious  . KIDNEY SURGERY     as child for blockages  . LEFT HEART CATH AND CORONARY ANGIOGRAPHY N/A 09/08/2019   Procedure: LEFT HEART CATH AND CORONARY ANGIOGRAPHY;  Surgeon: JMartinique Peter M, MD;  [facility];  Service: Cardiovascular;  Laterality: N/A;  . TONSILLECTOMY    . TUBAL LIGATION  1989  . TYMPANOSTOMY TUBE PLACEMENT Bilateral    "several times when I was a child"  . uterine ablation    . WRIST FRACTURE SURGERY Left 1995    Family Psychiatric History: see below  Family History:  Family History  Problem Relation Age of Onset  . Heart attack Father 433      deceased, etoh use  . Heart disease Father   . Alcohol abuse Father   . Depression Father   . Heart failure Father   . Sudden death Father   . Heart attack Mother 557      deceased  . Diabetes Mother   . Breast cancer Mother   . Heart failure Mother        oxygen dependence, nonsmoker  .  Heart disease Mother   . Depression Mother   . Cancer Mother   . Hypertension Mother   . Hyperlipidemia Mother   . Sudden death Mother   . Sleep apnea Mother   . Obesity Mother   . Liver disease Maternal AAunt 63      died while on liver transplant list  . Heart attack Maternal Grandmother        premature CAD  . Ulcers Sister   . Hypertension Sister   . Heart failure Sister   . Depression Sister   . Anxiety disorder Sister   . Colon cancer Neg Hx     Social History:  Social History   Socioeconomic History  . Marital status: Married    Spouse name: Not on file  . Number of children: 2  . Years of education: Not on file  . Highest education level: Not on file  Occupational History  . Occupation: unemployed  Tobacco Use  . Smoking status: Current Every Day Smoker    Packs/day: 1.50    Years: 40.00    Pack years: 60.00    Types: Cigarettes    Start date: 89 . Smokeless tobacco: Never Used  Vaping Use  . Vaping Use: Former  Substance and Sexual Activity  . Alcohol use: Yes    Comment: once a month - 3-4 mixed drinks  . Drug use: Yes    Types: Marijuana    Comment: daily use  . Sexual activity: Yes    Partners: Male    Birth control/protection: Surgical  Other Topics Concern  . Not on file  Social History Narrative  . Not on file   Social Determinants of Health   Financial Resource Strain: Low Risk   . Difficulty of Paying Living Expenses: Not hard at all  Food Insecurity: No Food Insecurity  . Worried About RCharity fundraiserin the Last Year: Never true  . Ran Out of Food in the Last Year: Never true  Transportation Needs: No Transportation Needs  . Lack of  Transportation (Medical): No  . Lack of Transportation (Non-Medical): No  Physical Activity: Inactive  . Days of Exercise per Week: 0 days  . Minutes of Exercise per Session: 0 min  Stress:   . Feeling of Stress : Not on file  Social Connections: Moderately Isolated  . Frequency of  Communication with Friends and Family: More than three times a week  . Frequency of Social Gatherings with Friends and Family: More than three times a week  . Attends Religious Services: Never  . Active Member of Clubs or Organizations: No  . Attends Archivist Meetings: Never  . Marital Status: Married    Allergies:  Allergies  Allergen Reactions  . Codeine Itching and Nausea Only  . Metformin And Related Diarrhea and Other (See Comments)    Stomach pain/nausea  . Wellbutrin [Bupropion Hcl] Hives  . Acyclovir And Related Rash    Metabolic Disorder Labs: Lab Results  Component Value Date   HGBA1C 7.6 (H) 06/30/2019   MPG 188.64 03/05/2019   MPG 160 (H) 03/10/2015   No results found for: PROLACTIN Lab Results  Component Value Date   CHOL 151 06/30/2019   TRIG 98 06/30/2019   HDL 48 06/30/2019   CHOLHDL 3.4 02/12/2018   VLDL 35 10/06/2017   LDLCALC 85 06/30/2019   LDLCALC 74 02/12/2018   Lab Results  Component Value Date   TSH 1.860 06/30/2019   TSH 2.40 11/26/2018    Therapeutic Level Labs: No results found for: LITHIUM No results found for: VALPROATE No components found for:  CBMZ  Current Medications: Current Outpatient Medications  Medication Sig Dispense Refill  . ACCU-CHEK GUIDE test strip USE UP TO FOUR TIMES DAILY AS DIRECTED 200 strip 11  . Accu-Chek Softclix Lancets lancets USE UP TO FOUR TIMES DAILY AS DIRECTED 300 each 0  . albuterol (PROVENTIL) (2.5 MG/3ML) 0.083% nebulizer solution Take 3 mLs (2.5 mg total) by nebulization every 6 (six) hours as needed for wheezing or shortness of breath. 75 mL 12  . albuterol (VENTOLIN HFA) 108 (90 Base) MCG/ACT inhaler Inhale 2 puffs into the lungs every 6 (six) hours as needed for wheezing or shortness of breath.    . amphetamine-dextroamphetamine (ADDERALL) 30 MG tablet Take 1 tablet by mouth 2 (two) times daily. 60 tablet 0  . amphetamine-dextroamphetamine (ADDERALL) 30 MG tablet Take 1 tablet by mouth  2 (two) times daily. 60 tablet 0  . amphetamine-dextroamphetamine (ADDERALL) 30 MG tablet Take 1 tablet by mouth 2 (two) times daily. 60 tablet 0  . ARIPiprazole (ABILIFY) 2 MG tablet Take 1 tablet (2 mg total) by mouth daily. 30 tablet 2  . aspirin EC 81 MG tablet Take 1 tablet (81 mg total) by mouth daily. 90 tablet 3  . atorvastatin (LIPITOR) 40 MG tablet Take 1 tablet (40 mg total) by mouth daily. 30 tablet 2  . baclofen (LIORESAL) 10 MG tablet Take 0.5-1 tablets (5-10 mg total) by mouth 3 (three) times daily as needed for muscle spasms. 30 each 3  . benzonatate (TESSALON) 100 MG capsule Take 1 capsule (100 mg total) by mouth every 8 (eight) hours. 21 capsule 0  . blood glucose meter kit and supplies Dispense based on patient and insurance preference. Use up to four times daily as directed. (FOR ICD-10 E10.9, E11.9). 1 each 0  . Cholecalciferol (VITAMIN D-3) 125 MCG (5000 UT) TABS Take 5,000 Units by mouth daily.    . ciprofloxacin-dexamethasone (CIPRODEX) OTIC suspension Place 4-5 drops into the right  ear 2 (two) times daily as needed (drainage).    . clonazePAM (KLONOPIN) 0.5 MG tablet Take 1 tablet (0.5 mg total) by mouth 2 (two) times daily. 60 tablet 2  . cycloSPORINE (RESTASIS) 0.05 % ophthalmic emulsion Place 1 drop into both eyes 2 (two) times daily.    [name] doxycycline (VIBRAMYCIN) 100 MG capsule Take 1 capsule (100 mg total) by mouth 2 (two) times daily. 20 capsule 0  . DULoxetine (CYMBALTA) 60 MG capsule Take 1 capsule (60 mg total) by mouth daily. 60 capsule 2  . ezetimibe (ZETIA) 10 MG tablet Take 1 tablet (10 mg total) by mouth daily. 90 tablet 3  . fluticasone (FLONASE) 50 MCG/ACT nasal spray INSTILL 1 SPRAY INTO EACH NOSTRIL TWICE DAILY AFTER SINUS RINSES 16 g 2  . furosemide (LASIX) 40 MG tablet AS NEEDED for weight increase of 3 lbs overnight or 5 lbs in 1 week 90 tablet 3  . guaiFENesin-dextromethorphan (ROBITUSSIN DM) 100-10 MG/5ML syrup Take 5 mLs by mouth every 4 (four) hours  as needed for cough. 118 mL 0  . ibuprofen (ADVIL) 200 MG tablet Take 200 mg by mouth every 6 (six) hours as needed.    . Insulin Pen Needle (BD PEN NEEDLE NANO U/F) 32G X 4 MM MISC USE AS DIRECTED WITH VICTOZA 100 each 11  . INVOKANA 300 MG TABS tablet TAKE 1 TABLET(300 MG) BY MOUTH DAILY BEFORE BREAKFAST 90 tablet 0  . isosorbide mononitrate (IMDUR) 30 MG 24 hr tablet TAKE 1 TABLET BY MOUTH EVERY DAY 90 tablet 3  . Lancets Misc. (ACCU-CHEK SOFTCLIX LANCET DEV) KIT Check Blood sugar up to 4 times a day 1 kit 0  . liraglutide (VICTOZA) 18 MG/3ML SOPN ADMINISTER 1.8 MG UNDER THE SKIN DAILY 27 mL 1  . losartan (COZAAR) 25 MG tablet TAKE 1 TABLET(25 MG) BY MOUTH DAILY 90 tablet 0  . meloxicam (MOBIC) 15 MG tablet Take 1 tablet (15 mg total) by mouth daily. 90 tablet 0  . metoprolol tartrate (LOPRESSOR) 25 MG tablet Take 1 tablet (25 mg total) by mouth 2 (two) times daily. 180 tablet 3  . Multiple Vitamins-Minerals (MULTIVITAMIN WITH MINERALS) tablet Take 1 tablet by mouth daily.    . nabumetone (RELAFEN) 500 MG tablet Take 1 tablet (500 mg total) by mouth 2 (two) times daily as needed. 60 tablet 3  . nitroGLYCERIN (NITROSTAT) 0.4 MG SL tablet Place 1 tablet (0.4 mg total) under the tongue every 5 (five) minutes as needed for chest pain. 25 tablet 3  . pantoprazole (PROTONIX) 40 MG tablet TAKE 1 TABLET BY MOUTH EVERY DAY 30 MINUTES BEFORE A MEAL 90 tablet 3  . predniSONE (DELTASONE) 50 MG tablet Take 1 tablet (50 mg total) by mouth daily with breakfast. 5 tablet 0  . pregabalin (LYRICA) 150 MG capsule TAKE 1 CAPSULE BY MOUTH TWICE DAILY 180 capsule 3   Current Facility-Administered Medications  Medication Dose Route Frequency Provider Last Rate Last Admin  . sodium chloride flush (NS) 0.9 % injection 3 mL  3 mL Intravenous Q12H Donato Heinz, MD         Musculoskeletal: Strength & Muscle Tone: within normal limits Gait & Station: normal Patient leans: N/A  Psychiatric Specialty  Exam: Review of Systems  Constitutional: Positive for fatigue.  Psychiatric/Behavioral: Positive for decreased concentration and dysphoric mood. The patient is nervous/anxious.   All other systems reviewed and are negative.   Last menstrual period 08/01/2014.There is no height or weight on file to calculate  BMI.  General Appearance: NA  Eye Contact:  NA  Speech:  Clear and Coherent  Volume:  Normal  Mood:  Anxious and Depressed  Affect:  NA  Thought Process:  Goal Directed  Orientation:  Full (Time, Place, and Person)  Thought Content: Rumination   Suicidal Thoughts:  No  Homicidal Thoughts:  No  Memory:  Immediate;   Good Recent;   Good Remote;   Good  Judgement:  Good  Insight:  Fair  Psychomotor Activity:  Decreased  Concentration:  Concentration: Fair and Attention Span: Fair  Recall:  Good  Fund of Knowledge: Good  Language: Good  Akathisia:  No  Handed:  Right  AIMS (if indicated): not done  Assets:  Communication Skills Desire for Improvement Resilience Social Support Talents/Skills  ADL's:  Intact  Cognition: WNL  Sleep:  Fair   Screenings: AIMS     Admission (Discharged) from 03/07/2019 in [place] Admission (Discharged) from 09/12/2014 in [place] [facility]  AIMS Total Score 0 0    AUDIT     Admission (Discharged) from 03/07/2019 in [place] Admission (Discharged) from 09/12/2014 in [place] [facility]  Alcohol Use Disorder Identification Test Final Score (AUDIT) 0 0    GAD-7     Office Visit from 11/06/2019 in [facility]  Total GAD-7 Score 5    PHQ2-9     Office Visit from 11/06/2019 in [facility] from 10/30/2019 in [place] Office Visit from 06/30/2019 in [place] Office Visit from 06/20/2019 in [facility] Office Visit from  09/24/2018 in [facility] Total Score 0 0 5 0 2  PHQ-9 Total Score 6 -- 17 -- 9       Assessment and Plan: This patient is a 54 year old female with a history of depression and anxiety and ADD. She has not been doing well recently much of this has to do with living with her daughter who has been very difficult depressed and angry. I have urged her to think of a way to create some separation. In the meantime we will continue Cymbalta 60 mg daily for depression, add Abilify 2 mg for augmentation, add clonazepam 0.5 mg twice daily for anxiety and continue Adderall 30 mg twice daily for ADD. She will return to see me in 4 weeks and we will get her back into therapy   Sheila Spiller, MD 02/25/2020, 1:26 PM

## 2020-03-15 DIAGNOSIS — G4733 Obstructive sleep apnea (adult) (pediatric): Secondary | ICD-10-CM | POA: Diagnosis not present

## 2020-03-24 ENCOUNTER — Other Ambulatory Visit (HOSPITAL_COMMUNITY): Payer: Self-pay | Admitting: Psychiatry

## 2020-03-26 ENCOUNTER — Encounter (HOSPITAL_COMMUNITY): Payer: Self-pay | Admitting: Psychiatry

## 2020-03-26 ENCOUNTER — Other Ambulatory Visit: Payer: Self-pay

## 2020-03-26 ENCOUNTER — Telehealth (INDEPENDENT_AMBULATORY_CARE_PROVIDER_SITE_OTHER): Payer: Medicare HMO | Admitting: Psychiatry

## 2020-03-26 DIAGNOSIS — F331 Major depressive disorder, recurrent, moderate: Secondary | ICD-10-CM | POA: Diagnosis not present

## 2020-03-26 DIAGNOSIS — F901 Attention-deficit hyperactivity disorder, predominantly hyperactive type: Secondary | ICD-10-CM

## 2020-03-26 MED ORDER — CLONAZEPAM 0.5 MG PO TABS: 0.5000 mg | ORAL_TABLET | Freq: Two times a day (BID) | ORAL | 2 refills | Status: DC

## 2020-03-26 MED ORDER — AMPHETAMINE-DEXTROAMPHETAMINE 30 MG PO TABS: 30.0000 mg | ORAL_TABLET | Freq: Two times a day (BID) | ORAL | 0 refills | Status: DC

## 2020-03-26 MED ORDER — DULOXETINE HCL 60 MG PO CPEP: 60.0000 mg | ORAL_CAPSULE | Freq: Every day | ORAL | 2 refills | Status: DC

## 2020-03-26 MED ORDER — ARIPIPRAZOLE 5 MG PO TABS: 5.0000 mg | ORAL_TABLET | Freq: Every day | ORAL | 2 refills | Status: DC

## 2020-03-26 NOTE — Progress Notes (Signed)
Virtual Visit via Telephone Note  I connected with [name] on 03/26/20 at 11:40 AM EST by telephone and verified that I am speaking with the correct person using two identifiers.  Location: Patient: home Provider: home   I discussed the limitations, risks, security and privacy concerns of performing an evaluation and management service by telephone and the availability of in person appointments. I also discussed with the patient that there may be a patient responsible charge related to this service. The patient expressed understanding and agreed to proceed.     I discussed the assessment and treatment plan with the patient. The patient was provided an opportunity to ask questions and all were answered. The patient agreed with the plan and demonstrated an understanding of the instructions.   The patient was advised to call back or seek an in-person evaluation if the symptoms worsen or if the condition fails to improve as anticipated.  I provided 15 minutes of non-face-to-face time during this encounter.   [name], MD  [facility] MD/PA/NP OP Progress Note  03/26/2020 11:59 AM [name]  MRN:  [id number]  Chief Complaint:  Chief Complaint    Depression; Anxiety; Follow-up     HPI: This patient is a 54 year old married white female who lives with her husband her daughter and the daughter's 3 children in [place]. She is on disability.  The patient returns for follow-up after 4 weeks.  Last time she was having a difficult time was very depressed.  Her daughter was suffering from postpartum depression and they were both feeling badly and nothing was getting done in the home.  We did add Abilify which may have helped a little bit although she feels the dosage may need to be increased.  What has helped her the most is the fact that she and her husband have found a good church and she feels very uplifted by this.  Her mood has gotten better she is sleeping  fairly well and her focus is good.  She denies suicidal ideation Visit Diagnosis:    ICD-10-CM   1. Moderate episode of recurrent major depressive disorder (HCC)  F33.1   2. Attention deficit hyperactivity disorder (ADHD), predominantly hyperactive type  F90.1     Past Psychiatric History: She was hospitalized twice in her 21s and again in 2006 and in November 2020 for drug overdose with suicidal intent  Past Medical History:  Past Medical History:  Diagnosis Date  . ADHD   . Alcohol abuse   . Anemia   . Anxiety   . Asthmatic bronchitis    normal PFT/ seen by pulmonary no evidence of COPD  . Atypical chest pain 10/05/2017  . Back pain   . Bilateral swelling of feet   . Chest pain   . Chest pain on respiration 03/25/2014  . Chronic bronchitis ([place])   . Chronic lower back pain   . Chronic respiratory failure ([place]) 10/16/2011   Newly 02 dep 24/7 p discharge from [facility] 01/2013  - 03/13/2014  Walked RA  2 laps @ 185 ft each stopped due to  Sob/ aching in legs, thirsty/ no desat @ slow pace   . Chronic respiratory failure with hypoxia (HCC)    On 2-3 L of oxygen at home  . Cigarette smoker 12/02/2010   Followed in Pulmonary clinic/ [facility]/ [place]   - Limits of effective care reviewed 12/22/2011    . Complication of anesthesia   . Constipation   . COPD (chronic  obstructive pulmonary disease) ([place])   . COPD with chronic bronchitis ([place]) 10/01/2017  . Daily headache   . Depression   . Diabetic peripheral neuropathy ([place])   . DM (diabetes mellitus) type II controlled, neurological manifestation ([place]) 12/02/2010  . Drug use   . Gallbladder problem   . Gastric erosions    EGD 08/2010.  [name] GERD (gastroesophageal reflux disease)   . Glaucoma   . H/O drug abuse ([place]) 11/12/2017   -- scanned document from outside source: [facility] in [place] which showed UDS positive for Adderall /amphetamine usage as well as positive urine for THC.  This test  result was collected 08/11/2016 and reported 10/07/2016. - lso review of the chart shows another positive amphetamine, THC and METH in the urine back on 10/18/2015 under "care everywhere". ---So   . Heavy menses   . High cholesterol   . History of blood transfusion    "related to low HgB" (10/05/2017)  . History of hiatal hernia   . HTN (hypertension)   . Hyperlipidemia associated with type 2 diabetes mellitus ([place]) 10/18/2015  . Hypertension associated with diabetes ([place]) 12/02/2010   D/c acei 12/22/2011 due to psuedowheeze and narcotic dependent cough> ? Improved - 78/24/2353 started bystolic in place of cozar due to cough    . IBS (irritable bowel syndrome)   . Increased urinary protein excretion   . Internal hemorrhoids    Colonoscopy 5/12.  . Iron deficiency anemia 10/01/2017  . Joint pain   . Mixed diabetic hyperlipidemia associated with type 2 diabetes mellitus ([place]) 10/01/2017  . On home oxygen therapy    "5L at night" (10/05/2017)  . OSA on CPAP   . Osteoarthritis    "back" (10/05/2017)  . Oxygen dependent 10/16/2011  . Pneumonia    "lots of times" (10/05/2017)  . PONV (postoperative nausea and vomiting)   . Poorly controlled diabetes mellitus ([place]) 11/12/2017  . Pulmonary infiltrates 12/22/2011   Followed in Pulmonary clinic/ [facility]/ [place]    - See CT Chest 05/05/11   . Shortness of breath   . Tachycardia    never had test done since no insurance  . Tobacco use disorder-current smoker greater than 40-pack-year history- since age 76 1 ppd 10/01/2017  . Type II diabetes mellitus ([place])   . Vitamin D deficiency     Past Surgical History:  Procedure Laterality Date  . CESAREAN SECTION  1988; 1989  . CHOLECYSTECTOMY OPEN  1990  . COLONOSCOPY  09/16/2010   IRW:ERXVQM colon/small internal hemorrhoids  . ESOPHAGOGASTRODUODENOSCOPY  09/16/2010   SLF: normal/mild gastritis  . ESOPHAGOGASTRODUODENOSCOPY N/A 10/19/2014   Procedure: ESOPHAGOGASTRODUODENOSCOPY (EGD);  Surgeon: Danie Binder, MD;  Location: [facility] ENDO SUITE;  Service: Endoscopy;  Laterality: N/A;  830  . FRACTURE SURGERY    . HYSTEROSCOPY WITH THERMACHOICE  01/17/2012   Procedure: HYSTEROSCOPY WITH THERMACHOICE;  Surgeon: Florian Buff, MD;  Location: [facility] ORS;  Service: Gynecology;  Laterality: N/A;  total therapy time: 9:13sec  D5W  18 ml in, D5W   45ml out, temperture 87degrees celcious  . KIDNEY SURGERY     as child for blockages  . LEFT HEART CATH AND CORONARY ANGIOGRAPHY N/A 09/08/2019   Procedure: LEFT HEART CATH AND CORONARY ANGIOGRAPHY;  Surgeon: Martinique, Peter M, MD;  Location: [facility] CV LAB;  Service: Cardiovascular;  Laterality: N/A;  . TONSILLECTOMY    . TUBAL LIGATION  1989  . TYMPANOSTOMY TUBE PLACEMENT Bilateral    "  several times when I was a child"  . uterine ablation    . WRIST FRACTURE SURGERY Left 1995    Family Psychiatric History: see below  Family History:  Family History  Problem Relation Age of Onset  . Heart attack Father 45       deceased, etoh use  . Heart disease Father   . Alcohol abuse Father   . Depression Father   . Heart failure Father   . Sudden death Father   . Heart attack Mother 68       deceased  . Diabetes Mother   . Breast cancer Mother   . Heart failure Mother        oxygen dependence, nonsmoker  . Heart disease Mother   . Depression Mother   . Cancer Mother   . Hypertension Mother   . Hyperlipidemia Mother   . Sudden death Mother   . Sleep apnea Mother   . Obesity Mother   . Liver disease Maternal Aunt 2       died while on liver transplant list  . Heart attack Maternal Grandmother        premature CAD  . Ulcers Sister   . Hypertension Sister   . Heart failure Sister   . Depression Sister   . Anxiety disorder Sister   . Colon cancer Neg Hx     Social History:  Social History   Socioeconomic History  . Marital status: Married    Spouse name: Not on file  . Number of children: 2  . Years of education: Not on file  . Highest  education level: Not on file  Occupational History  . Occupation: unemployed  Tobacco Use  . Smoking status: Current Every Day Smoker    Packs/day: 1.50    Years: 40.00    Pack years: 60.00    Types: Cigarettes    Start date: 62  . Smokeless tobacco: Never Used  Vaping Use  . Vaping Use: Former  Substance and Sexual Activity  . Alcohol use: Yes    Comment: once a month - 3-4 mixed drinks  . Drug use: Yes    Types: Marijuana    Comment: daily use  . Sexual activity: Yes    Partners: Male    Birth control/protection: Surgical  Other Topics Concern  . Not on file  Social History Narrative  . Not on file   Social Determinants of Health   Financial Resource Strain: Low Risk   . Difficulty of Paying Living Expenses: Not hard at all  Food Insecurity: No Food Insecurity  . Worried About Charity fundraiser in the Last Year: Never true  . Ran Out of Food in the Last Year: Never true  Transportation Needs: No Transportation Needs  . Lack of Transportation (Medical): No  . Lack of Transportation (Non-Medical): No  Physical Activity: Inactive  . Days of Exercise per Week: 0 days  . Minutes of Exercise per Session: 0 min  Stress:   . Feeling of Stress : Not on file  Social Connections: Moderately Isolated  . Frequency of Communication with Friends and Family: More than three times a week  . Frequency of Social Gatherings with Friends and Family: More than three times a week  . Attends Religious Services: Never  . Active Member of Clubs or Organizations: No  . Attends Archivist Meetings: Never  . Marital Status: Married    Allergies:  Allergies  Allergen Reactions  . Codeine Itching  and Nausea Only  . Metformin And Related Diarrhea and Other (See Comments)    Stomach pain/nausea  . Wellbutrin [Bupropion Hcl] Hives  . Acyclovir And Related Rash    Metabolic Disorder Labs: Lab Results  Component Value Date   HGBA1C 7.6 (H) 06/30/2019   MPG 188.64  03/05/2019   MPG 160 (H) 03/10/2015   No results found for: PROLACTIN Lab Results  Component Value Date   CHOL 151 06/30/2019   TRIG 98 06/30/2019   HDL 48 06/30/2019   CHOLHDL 3.4 02/12/2018   VLDL 35 10/06/2017   LDLCALC 85 06/30/2019   LDLCALC 74 02/12/2018   Lab Results  Component Value Date   TSH 1.860 06/30/2019   TSH 2.40 11/26/2018    Therapeutic Level Labs: No results found for: LITHIUM No results found for: VALPROATE No components found for:  CBMZ  Current Medications: Current Outpatient Medications  Medication Sig Dispense Refill  . ACCU-CHEK GUIDE test strip USE UP TO FOUR TIMES DAILY AS DIRECTED 200 strip 11  . Accu-Chek Softclix Lancets lancets USE UP TO FOUR TIMES DAILY AS DIRECTED 300 each 0  . albuterol (PROVENTIL) (2.5 MG/3ML) 0.083% nebulizer solution Take 3 mLs (2.5 mg total) by nebulization every 6 (six) hours as needed for wheezing or shortness of breath. 75 mL 12  . albuterol (VENTOLIN HFA) 108 (90 Base) MCG/ACT inhaler Inhale 2 puffs into the lungs every 6 (six) hours as needed for wheezing or shortness of breath.    . amphetamine-dextroamphetamine (ADDERALL) 30 MG tablet Take 1 tablet by mouth 2 (two) times daily. 60 tablet 0  . amphetamine-dextroamphetamine (ADDERALL) 30 MG tablet Take 1 tablet by mouth 2 (two) times daily. 60 tablet 0  . amphetamine-dextroamphetamine (ADDERALL) 30 MG tablet Take 1 tablet by mouth 2 (two) times daily. 60 tablet 0  . ARIPiprazole (ABILIFY) 5 MG tablet Take 1 tablet (5 mg total) by mouth daily. 30 tablet 2  . aspirin EC 81 MG tablet Take 1 tablet (81 mg total) by mouth daily. 90 tablet 3  . atorvastatin (LIPITOR) 40 MG tablet Take 1 tablet (40 mg total) by mouth daily. 30 tablet 2  . baclofen (LIORESAL) 10 MG tablet Take 0.5-1 tablets (5-10 mg total) by mouth 3 (three) times daily as needed for muscle spasms. 30 each 3  . benzonatate (TESSALON) 100 MG capsule Take 1 capsule (100 mg total) by mouth every 8 (eight) hours.  21 capsule 0  . blood glucose meter kit and supplies Dispense based on patient and insurance preference. Use up to four times daily as directed. (FOR ICD-10 E10.9, E11.9). 1 each 0  . Cholecalciferol (VITAMIN D-3) 125 MCG (5000 UT) TABS Take 5,000 Units by mouth daily.    . ciprofloxacin-dexamethasone (CIPRODEX) OTIC suspension Place 4-5 drops into the right ear 2 (two) times daily as needed (drainage).    . clonazePAM (KLONOPIN) 0.5 MG tablet Take 1 tablet (0.5 mg total) by mouth 2 (two) times daily. 60 tablet 2  . cycloSPORINE (RESTASIS) 0.05 % ophthalmic emulsion Place 1 drop into both eyes 2 (two) times daily.    [name] doxycycline (VIBRAMYCIN) 100 MG capsule Take 1 capsule (100 mg total) by mouth 2 (two) times daily. 20 capsule 0  . DULoxetine (CYMBALTA) 60 MG capsule Take 1 capsule (60 mg total) by mouth daily. 60 capsule 2  . ezetimibe (ZETIA) 10 MG tablet Take 1 tablet (10 mg total) by mouth daily. 90 tablet 3  . fluticasone (FLONASE) 50 MCG/ACT nasal spray INSTILL  1 SPRAY INTO EACH NOSTRIL TWICE DAILY AFTER SINUS RINSES 16 g 2  . furosemide (LASIX) 40 MG tablet AS NEEDED for weight increase of 3 lbs overnight or 5 lbs in 1 week 90 tablet 3  . guaiFENesin-dextromethorphan (ROBITUSSIN DM) 100-10 MG/5ML syrup Take 5 mLs by mouth every 4 (four) hours as needed for cough. 118 mL 0  . ibuprofen (ADVIL) 200 MG tablet Take 200 mg by mouth every 6 (six) hours as needed.    . Insulin Pen Needle (BD PEN NEEDLE NANO U/F) 32G X 4 MM MISC USE AS DIRECTED WITH VICTOZA 100 each 11  . INVOKANA 300 MG TABS tablet TAKE 1 TABLET(300 MG) BY MOUTH DAILY BEFORE BREAKFAST 90 tablet 0  . isosorbide mononitrate (IMDUR) 30 MG 24 hr tablet TAKE 1 TABLET BY MOUTH EVERY DAY 90 tablet 3  . Lancets Misc. (ACCU-CHEK SOFTCLIX LANCET DEV) KIT Check Blood sugar up to 4 times a day 1 kit 0  . liraglutide (VICTOZA) 18 MG/3ML SOPN ADMINISTER 1.8 MG UNDER THE SKIN DAILY 27 mL 1  . losartan (COZAAR) 25 MG tablet TAKE 1 TABLET(25 MG)  BY MOUTH DAILY 90 tablet 0  . meloxicam (MOBIC) 15 MG tablet Take 1 tablet (15 mg total) by mouth daily. 90 tablet 0  . metoprolol tartrate (LOPRESSOR) 25 MG tablet Take 1 tablet (25 mg total) by mouth 2 (two) times daily. 180 tablet 3  . Multiple Vitamins-Minerals (MULTIVITAMIN WITH MINERALS) tablet Take 1 tablet by mouth daily.    . nabumetone (RELAFEN) 500 MG tablet Take 1 tablet (500 mg total) by mouth 2 (two) times daily as needed. 60 tablet 3  . nitroGLYCERIN (NITROSTAT) 0.4 MG SL tablet Place 1 tablet (0.4 mg total) under the tongue every 5 (five) minutes as needed for chest pain. 25 tablet 3  . pantoprazole (PROTONIX) 40 MG tablet TAKE 1 TABLET BY MOUTH EVERY DAY 30 MINUTES BEFORE A MEAL 90 tablet 3  . predniSONE (DELTASONE) 50 MG tablet Take 1 tablet (50 mg total) by mouth daily with breakfast. 5 tablet 0  . pregabalin (LYRICA) 150 MG capsule TAKE 1 CAPSULE BY MOUTH TWICE DAILY 180 capsule 3   Current Facility-Administered Medications  Medication Dose Route Frequency Provider Last Rate Last Admin  . sodium chloride flush (NS) 0.9 % injection 3 mL  3 mL Intravenous Q12H Donato Heinz, MD         Musculoskeletal: Strength & Muscle Tone: within normal limits Gait & Station: normal Patient leans: N/A  Psychiatric Specialty Exam: Review of Systems  Psychiatric/Behavioral: Positive for dysphoric mood.  All other systems reviewed and are negative.   Last menstrual period 08/01/2014.There is no height or weight on file to calculate BMI.  General Appearance: NA  Eye Contact:  NA  Speech:  Clear and Coherent  Volume:  Normal  Mood:  Dysphoric  Affect:  NA  Thought Process:  Goal Directed  Orientation:  Full (Time, Place, and Person)  Thought Content: WDL   Suicidal Thoughts:  No  Homicidal Thoughts:  No  Memory:  Immediate;   Good Recent;   Good Remote;   Good  Judgement:  Good  Insight:  Good  Psychomotor Activity:  Normal  Concentration:  Concentration: Good and  Attention Span: Good  Recall:  Good  Fund of Knowledge: Good  Language: Good  Akathisia:  No  Handed:  Right  AIMS (if indicated): not done  Assets:  Communication Skills Desire for Improvement Physical Health Resilience Social Support Talents/Skills  ADL's:  Intact  Cognition: WNL  Sleep:  Fair   Screenings: AIMS     Admission (Discharged) from 03/07/2019 in [place] Admission (Discharged) from 09/12/2014 in [facility]  AIMS Total Score 0 0    AUDIT     Admission (Discharged) from 03/07/2019 in [place] Admission (Discharged) from 09/12/2014 in [facility] [facility]  Alcohol Use Disorder Identification Test Final Score (AUDIT) 0 0    GAD-7     Office Visit from 11/06/2019 in [place]  Total GAD-7 Score 5    PHQ2-9     Office Visit from 11/06/2019 in [place] from 10/30/2019 in [place] Visit from 06/30/2019 in [place] Office Visit from 06/20/2019 in [place] Visit from 09/24/2018 in [place] at [facility] Total Score 0 0 5 0 2  PHQ-9 Total Score 6 -- 17 -- 9       Assessment and Plan: This patient is a 54 year old female with a history of depression anxiety and ADD.  She seems to be doing somewhat better but thinks maybe the Abilify should be increased.  We will therefore increase it to 5 mg daily.  She will continue Cymbalta 60 mg daily for depression, clonazepam 0.5 mg twice daily for anxiety and Adderall 30 mg twice daily for ADD.  She will return to see me in 2 months   [name], MD 03/26/2020, 11:59 AM

## 2020-04-08 ENCOUNTER — Other Ambulatory Visit: Payer: Self-pay

## 2020-04-08 ENCOUNTER — Telehealth (HOSPITAL_COMMUNITY): Payer: Self-pay | Admitting: Psychiatry

## 2020-04-08 ENCOUNTER — Ambulatory Visit (HOSPITAL_COMMUNITY): Payer: Medicare HMO | Admitting: Psychiatry

## 2020-04-08 NOTE — Telephone Encounter (Signed)
Therapist called patient for scheduled appointment and received voicemail message.  Therapist left message indicating attempt and requesting patient call office. 

## 2020-04-14 DIAGNOSIS — G4733 Obstructive sleep apnea (adult) (pediatric): Secondary | ICD-10-CM | POA: Diagnosis not present

## 2020-04-26 DIAGNOSIS — G4733 Obstructive sleep apnea (adult) (pediatric): Secondary | ICD-10-CM | POA: Diagnosis not present

## 2020-05-11 ENCOUNTER — Other Ambulatory Visit: Payer: Self-pay | Admitting: Family Medicine

## 2020-05-11 DIAGNOSIS — E1169 Type 2 diabetes mellitus with other specified complication: Secondary | ICD-10-CM

## 2020-05-13 ENCOUNTER — Telehealth (HOSPITAL_COMMUNITY): Payer: Self-pay | Admitting: Psychiatry

## 2020-05-13 NOTE — Telephone Encounter (Signed)
Called to schedule f/u appt left vm 

## 2020-05-15 DIAGNOSIS — G4733 Obstructive sleep apnea (adult) (pediatric): Secondary | ICD-10-CM | POA: Diagnosis not present

## 2020-05-24 ENCOUNTER — Other Ambulatory Visit: Payer: Self-pay | Admitting: Family Medicine

## 2020-05-24 DIAGNOSIS — E1143 Type 2 diabetes mellitus with diabetic autonomic (poly)neuropathy: Secondary | ICD-10-CM

## 2020-05-24 DIAGNOSIS — E1159 Type 2 diabetes mellitus with other circulatory complications: Secondary | ICD-10-CM

## 2020-05-24 DIAGNOSIS — I152 Hypertension secondary to endocrine disorders: Secondary | ICD-10-CM

## 2020-05-24 DIAGNOSIS — E1129 Type 2 diabetes mellitus with other diabetic kidney complication: Secondary | ICD-10-CM

## 2020-05-24 NOTE — Telephone Encounter (Signed)
Note to Pharmacy: Needs appt before next refill. Last OV 11/06/19 Last fill 02/24/20  #90/0

## 2020-05-25 ENCOUNTER — Other Ambulatory Visit: Payer: Self-pay

## 2020-05-25 ENCOUNTER — Telehealth (INDEPENDENT_AMBULATORY_CARE_PROVIDER_SITE_OTHER): Payer: Medicare HMO | Admitting: Psychiatry

## 2020-05-25 ENCOUNTER — Encounter (HOSPITAL_COMMUNITY): Payer: Self-pay | Admitting: Psychiatry

## 2020-05-25 DIAGNOSIS — F331 Major depressive disorder, recurrent, moderate: Secondary | ICD-10-CM | POA: Diagnosis not present

## 2020-05-25 DIAGNOSIS — R69 Illness, unspecified: Secondary | ICD-10-CM | POA: Diagnosis not present

## 2020-05-25 DIAGNOSIS — F901 Attention-deficit hyperactivity disorder, predominantly hyperactive type: Secondary | ICD-10-CM | POA: Diagnosis not present

## 2020-05-25 MED ORDER — DULOXETINE HCL 60 MG PO CPEP: 60.0000 mg | ORAL_CAPSULE | Freq: Every day | ORAL | 2 refills | Status: DC

## 2020-05-25 MED ORDER — AMPHETAMINE-DEXTROAMPHETAMINE 30 MG PO TABS
30.0000 mg | ORAL_TABLET | Freq: Two times a day (BID) | ORAL | 0 refills | Status: DC
Start: 2020-05-25 — End: 2020-08-23

## 2020-05-25 MED ORDER — AMPHETAMINE-DEXTROAMPHETAMINE 30 MG PO TABS: 30.0000 mg | ORAL_TABLET | Freq: Two times a day (BID) | ORAL | 0 refills | Status: DC

## 2020-05-25 MED ORDER — ARIPIPRAZOLE 5 MG PO TABS
5.0000 mg | ORAL_TABLET | Freq: Every day | ORAL | 2 refills | Status: DC
Start: 2020-05-25 — End: 2020-08-23

## 2020-05-25 MED ORDER — CLONAZEPAM 0.5 MG PO TABS
0.5000 mg | ORAL_TABLET | Freq: Two times a day (BID) | ORAL | 2 refills | Status: DC
Start: 2020-05-25 — End: 2020-08-23

## 2020-05-25 NOTE — Progress Notes (Signed)
Virtual Visit via Telephone Note  I connected with [name] on 05/25/20 at  1:20 PM EST by telephone and verified that I am speaking with the correct person using two identifiers.  Location: Patient: home Provider: home   I discussed the limitations, risks, security and privacy concerns of performing an evaluation and management service by telephone and the availability of in person appointments. I also discussed with the patient that there may be a patient responsible charge related to this service. The patient expressed understanding and agreed to proceed.     I discussed the assessment and treatment plan with the patient. The patient was provided an opportunity to ask questions and all were answered. The patient agreed with the plan and demonstrated an understanding of the instructions.   The patient was advised to call back or seek an in-person evaluation if the symptoms worsen or if the condition fails to improve as anticipated.  I provided 15 minutes of non-face-to-face time during this encounter.   [name], MD  [facility] MD/PA/NP OP Progress Note  05/25/2020 1:40 PM [name]  MRN:  [id number]  Chief Complaint:  Chief Complaint    Anxiety; Depression; ADHD; Follow-up     HPI: This patient is a 55 year old married white female who lives with her husband her daughter and the daughter's 3 children in [place]. She is on disability.  The patient returns for follow-up after 2 months.  She seems to be doing somewhat better.  She states that her mood has been stable and she denies serious depression or suicidal ideation.  She is sleeping well.  The Adderall for the most part is helping her focus particular she takes 1-1/2 of the 30 mg in the morning and the other half at lunchtime.  She is very worried about her husband who is totally lost his voice.  He is scheduled to see an ENT specialist. Visit Diagnosis:    ICD-10-CM   1. Attention deficit  hyperactivity disorder (ADHD), predominantly hyperactive type  F90.1   2. Moderate episode of recurrent major depressive disorder ([place])  F33.1     Past Psychiatric History: She was hospitalized twice in her 36s and again in 2006 and in November 2020 for drug overdose with suicidal intent  Past Medical History:  Past Medical History:  Diagnosis Date  . ADHD   . Alcohol abuse   . Anemia   . Anxiety   . Asthmatic bronchitis    normal PFT/ seen by pulmonary no evidence of COPD  . Atypical chest pain 10/05/2017  . Back pain   . Bilateral swelling of feet   . Chest pain   . Chest pain on respiration 03/25/2014  . Chronic bronchitis ([place])   . Chronic lower back pain   . Chronic respiratory failure ([place]) 10/16/2011   Newly 02 dep 24/7 p discharge from [facility] 01/2013  - 03/13/2014  Walked RA  2 laps @ 185 ft each stopped due to  Sob/ aching in legs, thirsty/ no desat @ slow pace   . Chronic respiratory failure with hypoxia (HCC)    On 2-3 L of oxygen at home  . Cigarette smoker 12/02/2010   Followed in Pulmonary clinic/ [facility]/ [place]   - Limits of effective care reviewed 12/22/2011    . Complication of anesthesia   . Constipation   . COPD (chronic obstructive pulmonary disease) ([place])   . COPD with chronic bronchitis ([place]) 10/01/2017  . Daily headache   . Depression   .  Diabetic peripheral neuropathy ([place])   . DM (diabetes mellitus) type II controlled, neurological manifestation ([place]) 12/02/2010  . Drug use   . Gallbladder problem   . Gastric erosions    EGD 08/2010.  [name] GERD (gastroesophageal reflux disease)   . Glaucoma   . H/O drug abuse ([place]) 11/12/2017   -- scanned document from outside source: [facility] in [place] which showed UDS positive for Adderall /amphetamine usage as well as positive urine for THC.  This test result was collected 08/11/2016 and reported 10/07/2016. - lso review of the chart shows another positive amphetamine, THC  and METH in the urine back on 10/18/2015 under "care everywhere". ---So   . Heavy menses   . High cholesterol   . History of blood transfusion    "related to low HgB" (10/05/2017)  . History of hiatal hernia   . HTN (hypertension)   . Hyperlipidemia associated with type 2 diabetes mellitus ([place]) 10/18/2015  . Hypertension associated with diabetes ([place]) 12/02/2010   D/c acei 12/22/2011 due to psuedowheeze and narcotic dependent cough> ? Improved - 68/34/1962 started bystolic in place of cozar due to cough    . IBS (irritable bowel syndrome)   . Increased urinary protein excretion   . Internal hemorrhoids    Colonoscopy 5/12.  . Iron deficiency anemia 10/01/2017  . Joint pain   . Mixed diabetic hyperlipidemia associated with type 2 diabetes mellitus ([place]) 10/01/2017  . On home oxygen therapy    "5L at night" (10/05/2017)  . OSA on CPAP   . Osteoarthritis    "back" (10/05/2017)  . Oxygen dependent 10/16/2011  . Pneumonia    "lots of times" (10/05/2017)  . PONV (postoperative nausea and vomiting)   . Poorly controlled diabetes mellitus ([place]) 11/12/2017  . Pulmonary infiltrates 12/22/2011   Followed in Pulmonary clinic/ [facility]/ [place]    - See CT Chest 05/05/11   . Shortness of breath   . Tachycardia    never had test done since no insurance  . Tobacco use disorder-current smoker greater than 40-pack-year history- since age 1 1 ppd 10/01/2017  . Type II diabetes mellitus ([place])   . Vitamin D deficiency     Past Surgical History:  Procedure Laterality Date  . CESAREAN SECTION  1988; 1989  . CHOLECYSTECTOMY OPEN  1990  . COLONOSCOPY  09/16/2010   IWL:NLGXQJ colon/small internal hemorrhoids  . ESOPHAGOGASTRODUODENOSCOPY  09/16/2010   SLF: normal/mild gastritis  . ESOPHAGOGASTRODUODENOSCOPY N/A 10/19/2014   Procedure: ESOPHAGOGASTRODUODENOSCOPY (EGD);  Surgeon: Danie Binder, MD;  Location: [facility] ENDO SUITE;  Service: Endoscopy;  Laterality: N/A;  830  . FRACTURE SURGERY    . HYSTEROSCOPY  WITH THERMACHOICE  01/17/2012   Procedure: HYSTEROSCOPY WITH THERMACHOICE;  Surgeon: Florian Buff, MD;  Location: [facility] ORS;  Service: Gynecology;  Laterality: N/A;  total therapy time: 9:13sec  D5W  18 ml in, D5W   22ml out, temperture 87degrees celcious  . KIDNEY SURGERY     as child for blockages  . LEFT HEART CATH AND CORONARY ANGIOGRAPHY N/A 09/08/2019   Procedure: LEFT HEART CATH AND CORONARY ANGIOGRAPHY;  Surgeon: Martinique, Peter M, MD;  Location: [facility];  Service: Cardiovascular;  Laterality: N/A;  . TONSILLECTOMY    . TUBAL LIGATION  1989  . TYMPANOSTOMY TUBE PLACEMENT Bilateral    "several times when I was a child"  . uterine ablation    . WRIST FRACTURE SURGERY Left 1995  Family Psychiatric History: see below  Family History:  Family History  Problem Relation Age of Onset  . Heart attack Father 5       deceased, etoh use  . Heart disease Father   . Alcohol abuse Father   . Depression Father   . Heart failure Father   . Sudden death Father   . Heart attack Mother 55       deceased  . Diabetes Mother   . Breast cancer Mother   . Heart failure Mother        oxygen dependence, nonsmoker  . Heart disease Mother   . Depression Mother   . Cancer Mother   . Hypertension Mother   . Hyperlipidemia Mother   . Sudden death Mother   . Sleep apnea Mother   . Obesity Mother   . Liver disease Maternal Aunt 63       died while on liver transplant list  . Heart attack Maternal Grandmother        premature CAD  . Ulcers Sister   . Hypertension Sister   . Heart failure Sister   . Depression Sister   . Anxiety disorder Sister   . Colon cancer Neg Hx     Social History:  Social History   Socioeconomic History  . Marital status: Married    Spouse name: Not on file  . Number of children: 2  . Years of education: Not on file  . Highest education level: Not on file  Occupational History  . Occupation: unemployed  Tobacco Use  . Smoking status: Current Every  Day Smoker    Packs/day: 1.50    Years: 40.00    Pack years: 60.00    Types: Cigarettes    Start date: 110  . Smokeless tobacco: Never Used  Vaping Use  . Vaping Use: Former  Substance and Sexual Activity  . Alcohol use: Yes    Comment: once a month - 3-4 mixed drinks  . Drug use: Yes    Types: Marijuana    Comment: daily use  . Sexual activity: Yes    Partners: Male    Birth control/protection: Surgical  Other Topics Concern  . Not on file  Social History Narrative  . Not on file   Social Determinants of Health   Financial Resource Strain: Low Risk   . Difficulty of Paying Living Expenses: Not hard at all  Food Insecurity: No Food Insecurity  . Worried About Charity fundraiser in the Last Year: Never true  . Ran Out of Food in the Last Year: Never true  Transportation Needs: No Transportation Needs  . Lack of Transportation (Medical): No  . Lack of Transportation (Non-Medical): No  Physical Activity: Inactive  . Days of Exercise per Week: 0 days  . Minutes of Exercise per Session: 0 min  Stress: Not on file  Social Connections: Moderately Isolated  . Frequency of Communication with Friends and Family: More than three times a week  . Frequency of Social Gatherings with Friends and Family: More than three times a week  . Attends Religious Services: Never  . Active Member of Clubs or Organizations: No  . Attends Archivist Meetings: Never  . Marital Status: Married    Allergies:  Allergies  Allergen Reactions  . Codeine Itching and Nausea Only  . Metformin And Related Diarrhea and Other (See Comments)    Stomach pain/nausea  . Wellbutrin [Bupropion Hcl] Hives  . Acyclovir And Related Rash  Metabolic Disorder Labs: Lab Results  Component Value Date   HGBA1C 7.6 (H) 06/30/2019   MPG 188.64 03/05/2019   MPG 160 (H) 03/10/2015   No results found for: PROLACTIN Lab Results  Component Value Date   CHOL 151 06/30/2019   TRIG 98 06/30/2019   HDL  48 06/30/2019   CHOLHDL 3.4 02/12/2018   VLDL 35 10/06/2017   LDLCALC 85 06/30/2019   LDLCALC 74 02/12/2018   Lab Results  Component Value Date   TSH 1.860 06/30/2019   TSH 2.40 11/26/2018    Therapeutic Level Labs: No results found for: LITHIUM No results found for: VALPROATE No components found for:  CBMZ  Current Medications: Current Outpatient Medications  Medication Sig Dispense Refill  . ACCU-CHEK GUIDE test strip USE UP TO FOUR TIMES DAILY AS DIRECTED 200 strip 11  . Accu-Chek Softclix Lancets lancets USE UP TO FOUR TIMES DAILY AS DIRECTED 300 each 0  . albuterol (PROVENTIL) (2.5 MG/3ML) 0.083% nebulizer solution Take 3 mLs (2.5 mg total) by nebulization every 6 (six) hours as needed for wheezing or shortness of breath. 75 mL 12  . albuterol (VENTOLIN HFA) 108 (90 Base) MCG/ACT inhaler Inhale 2 puffs into the lungs every 6 (six) hours as needed for wheezing or shortness of breath.    . amphetamine-dextroamphetamine (ADDERALL) 30 MG tablet Take 1 tablet by mouth 2 (two) times daily. 60 tablet 0  . amphetamine-dextroamphetamine (ADDERALL) 30 MG tablet Take 1 tablet by mouth 2 (two) times daily. 60 tablet 0  . amphetamine-dextroamphetamine (ADDERALL) 30 MG tablet Take 1 tablet by mouth 2 (two) times daily. 60 tablet 0  . ARIPiprazole (ABILIFY) 5 MG tablet Take 1 tablet (5 mg total) by mouth daily. 30 tablet 2  . aspirin EC 81 MG tablet Take 1 tablet (81 mg total) by mouth daily. 90 tablet 3  . atorvastatin (LIPITOR) 40 MG tablet Take 1 tablet (40 mg total) by mouth daily. 30 tablet 2  . baclofen (LIORESAL) 10 MG tablet Take 0.5-1 tablets (5-10 mg total) by mouth 3 (three) times daily as needed for muscle spasms. 30 each 3  . benzonatate (TESSALON) 100 MG capsule Take 1 capsule (100 mg total) by mouth every 8 (eight) hours. 21 capsule 0  . blood glucose meter kit and supplies Dispense based on patient and insurance preference. Use up to four times daily as directed. (FOR ICD-10  E10.9, E11.9). 1 each 0  . Cholecalciferol (VITAMIN D-3) 125 MCG (5000 UT) TABS Take 5,000 Units by mouth daily.    . ciprofloxacin-dexamethasone (CIPRODEX) OTIC suspension Place 4-5 drops into the right ear 2 (two) times daily as needed (drainage).    . clonazePAM (KLONOPIN) 0.5 MG tablet Take 1 tablet (0.5 mg total) by mouth 2 (two) times daily. 60 tablet 2  . cycloSPORINE (RESTASIS) 0.05 % ophthalmic emulsion Place 1 drop into both eyes 2 (two) times daily.    [name] doxycycline (VIBRAMYCIN) 100 MG capsule Take 1 capsule (100 mg total) by mouth 2 (two) times daily. 20 capsule 0  . DULoxetine (CYMBALTA) 60 MG capsule Take 1 capsule (60 mg total) by mouth daily. 60 capsule 2  . ezetimibe (ZETIA) 10 MG tablet Take 1 tablet (10 mg total) by mouth daily. 90 tablet 3  . fluticasone (FLONASE) 50 MCG/ACT nasal spray INSTILL 1 SPRAY INTO EACH NOSTRIL TWICE DAILY AFTER SINUS RINSES. PLEASE SCHEDULE APPOINTMENT FOR FURTHER REFILLS 48 g 2  . furosemide (LASIX) 40 MG tablet AS NEEDED for weight increase of 3  lbs overnight or 5 lbs in 1 week 90 tablet 3  . guaiFENesin-dextromethorphan (ROBITUSSIN DM) 100-10 MG/5ML syrup Take 5 mLs by mouth every 4 (four) hours as needed for cough. 118 mL 0  . ibuprofen (ADVIL) 200 MG tablet Take 200 mg by mouth every 6 (six) hours as needed.    . Insulin Pen Needle (BD PEN NEEDLE NANO U/F) 32G X 4 MM MISC USE AS DIRECTED WITH VICTOZA 100 each 11  . INVOKANA 300 MG TABS tablet TAKE 1 TABLET(300 MG) BY MOUTH DAILY BEFORE BREAKFAST 90 tablet 0  . isosorbide mononitrate (IMDUR) 30 MG 24 hr tablet TAKE 1 TABLET BY MOUTH EVERY DAY 90 tablet 3  . Lancets Misc. (ACCU-CHEK SOFTCLIX LANCET DEV) KIT Check Blood sugar up to 4 times a day 1 kit 0  . losartan (COZAAR) 25 MG tablet TAKE 1 TABLET(25 MG) BY MOUTH DAILY 90 tablet 0  . meloxicam (MOBIC) 15 MG tablet Take 1 tablet (15 mg total) by mouth daily. 90 tablet 0  . metoprolol tartrate (LOPRESSOR) 25 MG tablet Take 1 tablet (25 mg total) by  mouth 2 (two) times daily. 180 tablet 3  . Multiple Vitamins-Minerals (MULTIVITAMIN WITH MINERALS) tablet Take 1 tablet by mouth daily.    . nabumetone (RELAFEN) 500 MG tablet Take 1 tablet (500 mg total) by mouth 2 (two) times daily as needed. 60 tablet 3  . nitroGLYCERIN (NITROSTAT) 0.4 MG SL tablet Place 1 tablet (0.4 mg total) under the tongue every 5 (five) minutes as needed for chest pain. 25 tablet 3  . pantoprazole (PROTONIX) 40 MG tablet TAKE 1 TABLET BY MOUTH EVERY DAY 30 MINUTES BEFORE A MEAL 90 tablet 3  . predniSONE (DELTASONE) 50 MG tablet Take 1 tablet (50 mg total) by mouth daily with breakfast. 5 tablet 0  . pregabalin (LYRICA) 150 MG capsule TAKE 1 CAPSULE BY MOUTH TWICE DAILY 180 capsule 3  . VICTOZA 18 MG/3ML SOPN ADMINISTER 1.8 MG UNDER THE SKIN DAILY 27 mL 0   Current Facility-Administered Medications  Medication Dose Route Frequency Provider Last Rate Last Admin  . sodium chloride flush (NS) 0.9 % injection 3 mL  3 mL Intravenous Q12H Donato Heinz, MD         Musculoskeletal: Strength & Muscle Tone: within normal limits Gait & Station: normal Patient leans: N/A  Psychiatric Specialty Exam: Review of Systems  All other systems reviewed and are negative.   Last menstrual period 08/01/2014.There is no height or weight on file to calculate BMI.  General Appearance: NA  Eye Contact:  NA  Speech:  Clear and Coherent  Volume:  Normal  Mood:  Euthymic  Affect:  NA  Thought Process:  Goal Directed  Orientation:  Full (Time, Place, and Person)  Thought Content: WDL   Suicidal Thoughts:  No  Homicidal Thoughts:  No  Memory:  Immediate;   Good Recent;   Good Remote;   Fair  Judgement:  Good  Insight:  Fair  Psychomotor Activity:  Normal  Concentration:  Concentration: Good and Attention Span: Good  Recall:  Good  Fund of Knowledge: Good  Language: Good  Akathisia:  No  Handed:  Right  AIMS (if indicated): not done  Assets:  Communication  Skills Desire for Improvement Resilience Social Support Talents/Skills  ADL's:  Intact  Cognition: WNL  Sleep:  Good   Screenings: AIMS   Flowsheet Row Admission (Discharged) from 03/07/2019 in [place] Admission (Discharged) from 09/12/2014 in [place]  INPATIENT ADULT 400B  AIMS Total Score 0 0    AUDIT   Flowsheet Row Admission (Discharged) from 03/07/2019 in [place] Admission (Discharged) from 09/12/2014 in [place] 400B  Alcohol Use Disorder Identification Test Final Score (AUDIT) 0 0    GAD-7   Flowsheet Row Office Visit from 11/06/2019 in [place]  Total GAD-7 Score 5    PHQ2-9   [place] Visit from 11/06/2019 in [place] from 10/30/2019 in [place] Visit from 06/30/2019 in [place] Office Visit from 06/20/2019 in [place] Visit from 09/24/2018 in [place] at [facility] Total Score 0 0 5 0 2  PHQ-9 Total Score 6 - 17 - 9       Assessment and Plan: This patient is a 55 year old female with a history of depression anxiety and ADD.  She seems to be doing well in terms of mood and focus.  She will continue Cymbalta 60 mg daily for depression, Abilify 5 mg daily for augmentation, clonazepam 0.5 mg twice daily for anxiety and Adderall 30 mg twice daily for ADD.  She will return to see me in 3 months   [name], MD 05/25/2020, 1:40 PM

## 2020-05-27 DIAGNOSIS — G4733 Obstructive sleep apnea (adult) (pediatric): Secondary | ICD-10-CM | POA: Diagnosis not present

## 2020-06-08 ENCOUNTER — Telehealth (HOSPITAL_COMMUNITY): Payer: Self-pay | Admitting: Psychiatry

## 2020-06-08 NOTE — Telephone Encounter (Signed)
Called to schedule f/u appt, unable to leave vm, someone picked phone up and would not talk

## 2020-06-15 DIAGNOSIS — G4733 Obstructive sleep apnea (adult) (pediatric): Secondary | ICD-10-CM | POA: Diagnosis not present

## 2020-06-16 ENCOUNTER — Other Ambulatory Visit: Payer: Self-pay | Admitting: Family Medicine

## 2020-06-24 ENCOUNTER — Other Ambulatory Visit: Payer: Self-pay | Admitting: Family Medicine

## 2020-06-24 DIAGNOSIS — G4733 Obstructive sleep apnea (adult) (pediatric): Secondary | ICD-10-CM | POA: Diagnosis not present

## 2020-06-25 ENCOUNTER — Other Ambulatory Visit: Payer: Self-pay

## 2020-06-25 ENCOUNTER — Encounter: Payer: Self-pay | Admitting: Family Medicine

## 2020-06-25 ENCOUNTER — Encounter (INDEPENDENT_AMBULATORY_CARE_PROVIDER_SITE_OTHER): Payer: Self-pay

## 2020-06-25 DIAGNOSIS — R809 Proteinuria, unspecified: Secondary | ICD-10-CM

## 2020-06-25 DIAGNOSIS — E1129 Type 2 diabetes mellitus with other diabetic kidney complication: Secondary | ICD-10-CM

## 2020-06-25 MED ORDER — CANAGLIFLOZIN 300 MG PO TABS: ORAL_TABLET | ORAL | 0 refills | Status: DC

## 2020-06-25 MED ORDER — BD PEN NEEDLE NANO 2ND GEN 32G X 4 MM MISC: 11 refills | Status: DC

## 2020-07-13 DIAGNOSIS — G4733 Obstructive sleep apnea (adult) (pediatric): Secondary | ICD-10-CM | POA: Diagnosis not present

## 2020-07-25 DIAGNOSIS — G4733 Obstructive sleep apnea (adult) (pediatric): Secondary | ICD-10-CM | POA: Diagnosis not present

## 2020-07-26 ENCOUNTER — Telehealth: Payer: Self-pay

## 2020-07-26 DIAGNOSIS — E1169 Type 2 diabetes mellitus with other specified complication: Secondary | ICD-10-CM

## 2020-07-26 NOTE — Telephone Encounter (Signed)
Marlowe Kays from [facility] called regarding a prescription the RX for  [facility] 300 MG TABS  This med is not on the patients formulary. She gave 2 alternatives;  Jardiance and Farxiga  Thank you

## 2020-07-26 NOTE — Telephone Encounter (Signed)
Please see message and advise.  Thank you. ° °

## 2020-07-28 DIAGNOSIS — H25043 Posterior subcapsular polar age-related cataract, bilateral: Secondary | ICD-10-CM | POA: Diagnosis not present

## 2020-07-28 MED ORDER — DAPAGLIFLOZIN PROPANEDIOL 10 MG PO TABS
10.0000 mg | ORAL_TABLET | Freq: Every day | ORAL | 1 refills | Status: DC
Start: 2020-07-28 — End: 2021-01-10

## 2020-07-28 NOTE — Telephone Encounter (Signed)
Rx sent for farxiga 10mg  daily to pharm on file

## 2020-08-13 DIAGNOSIS — G4733 Obstructive sleep apnea (adult) (pediatric): Secondary | ICD-10-CM | POA: Diagnosis not present

## 2020-08-23 ENCOUNTER — Other Ambulatory Visit: Payer: Self-pay

## 2020-08-23 ENCOUNTER — Telehealth (INDEPENDENT_AMBULATORY_CARE_PROVIDER_SITE_OTHER): Payer: Medicare HMO | Admitting: Psychiatry

## 2020-08-23 ENCOUNTER — Encounter (HOSPITAL_COMMUNITY): Payer: Self-pay | Admitting: Psychiatry

## 2020-08-23 DIAGNOSIS — F901 Attention-deficit hyperactivity disorder, predominantly hyperactive type: Secondary | ICD-10-CM | POA: Diagnosis not present

## 2020-08-23 DIAGNOSIS — R69 Illness, unspecified: Secondary | ICD-10-CM | POA: Diagnosis not present

## 2020-08-23 DIAGNOSIS — F331 Major depressive disorder, recurrent, moderate: Secondary | ICD-10-CM | POA: Diagnosis not present

## 2020-08-23 MED ORDER — AMPHETAMINE-DEXTROAMPHETAMINE 30 MG PO TABS: 30.0000 mg | ORAL_TABLET | Freq: Two times a day (BID) | ORAL | 0 refills | Status: DC

## 2020-08-23 MED ORDER — ARIPIPRAZOLE 5 MG PO TABS: 5.0000 mg | ORAL_TABLET | Freq: Every day | ORAL | 2 refills | Status: DC

## 2020-08-23 MED ORDER — DULOXETINE HCL 60 MG PO CPEP
60.0000 mg | ORAL_CAPSULE | Freq: Every day | ORAL | 2 refills | Status: DC
Start: 2020-08-23 — End: 2020-11-24

## 2020-08-23 MED ORDER — CLONAZEPAM 0.5 MG PO TABS: 0.5000 mg | ORAL_TABLET | Freq: Two times a day (BID) | ORAL | 2 refills | Status: DC

## 2020-08-23 MED ORDER — AMPHETAMINE-DEXTROAMPHETAMINE 30 MG PO TABS
30.0000 mg | ORAL_TABLET | Freq: Two times a day (BID) | ORAL | 0 refills | Status: DC
Start: 2020-08-23 — End: 2020-10-20

## 2020-08-23 NOTE — Progress Notes (Signed)
Virtual Visit via Telephone Note  I connected with [name] on 08/23/20 at  2:00 PM EDT by telephone and verified that I am speaking with the correct person using two identifiers.  Location: Patient: home Provider:home office   I discussed the limitations, risks, security and privacy concerns of performing an evaluation and management service by telephone and the availability of in person appointments. I also discussed with the patient that there may be a patient responsible charge related to this service. The patient expressed understanding and agreed to proceed.     I discussed the assessment and treatment plan with the patient. The patient was provided an opportunity to ask questions and all were answered. The patient agreed with the plan and demonstrated an understanding of the instructions.   The patient was advised to call back or seek an in-person evaluation if the symptoms worsen or if the condition fails to improve as anticipated.  I provided 15 minutes of non-face-to-face time during this encounter.   [name], MD  [facility] MD/PA/NP OP Progress Note  08/23/2020 2:32 PM [name]  MRN:  [id number]  Chief Complaint:  Chief Complaint    Anxiety; Depression; ADD; Follow-up     HPI: This patient is a 55 year old married white female who lives with her husband her daughter and the daughter's 3 children in [place]. She is on disability.  The patient returns for follow-up after 3 months.  She states that she is very stressed.  Her husband was diagnosed with laryngeal cancer several months ago.  He now has a tracheostomy.  He is undergoing chemotherapy and radiation.  He is almost done with all of these treatments and then has to wait about 2 months and be rescanned.  Fortunately he has quit smoking and she is also trying to quit.  She states that her medications are still helping greatly with her depression and anxiety.  Going to church is helped  tremendously as well as the people at church have been very supportive. Visit Diagnosis:    ICD-10-CM   1. Attention deficit hyperactivity disorder (ADHD), predominantly hyperactive type  F90.1   2. Moderate episode of recurrent major depressive disorder ([place])  F33.1     Past Psychiatric History: She was hospitalized twice in her 69s and again in 2006 and in November 2020 for drug overdose with suicidal intent  Past Medical History:  Past Medical History:  Diagnosis Date  . ADHD   . Alcohol abuse   . Anemia   . Anxiety   . Asthmatic bronchitis    normal PFT/ seen by pulmonary no evidence of COPD  . Atypical chest pain 10/05/2017  . Back pain   . Bilateral swelling of feet   . Chest pain   . Chest pain on respiration 03/25/2014  . Chronic bronchitis ([place])   . Chronic lower back pain   . Chronic respiratory failure ([place]) 10/16/2011   Newly 02 dep 24/7 p discharge from [facility] 01/2013  - 03/13/2014  Walked RA  2 laps @ 185 ft each stopped due to  Sob/ aching in legs, thirsty/ no desat @ slow pace   . Chronic respiratory failure with hypoxia (HCC)    On 2-3 L of oxygen at home  . Cigarette smoker 12/02/2010   Followed in Pulmonary clinic/ [facility]/ [place]   - Limits of effective care reviewed 12/22/2011    . Complication of anesthesia   . Constipation   . COPD (chronic obstructive pulmonary disease) (  [place])   . COPD with chronic bronchitis ([place]) 10/01/2017  . Daily headache   . Depression   . Diabetic peripheral neuropathy ([place])   . DM (diabetes mellitus) type II controlled, neurological manifestation ([place]) 12/02/2010  . Drug use   . Gallbladder problem   . Gastric erosions    EGD 08/2010.  [name] GERD (gastroesophageal reflux disease)   . Glaucoma   . H/O drug abuse ([place]) 11/12/2017   -- scanned document from outside source: [facility] in [place] which showed UDS positive for Adderall /amphetamine usage as well as positive urine for THC.  This  test result was collected 08/11/2016 and reported 10/07/2016. - lso review of the chart shows another positive amphetamine, THC and METH in the urine back on 10/18/2015 under "care everywhere". ---So   . Heavy menses   . High cholesterol   . History of blood transfusion    "related to low HgB" (10/05/2017)  . History of hiatal hernia   . HTN (hypertension)   . Hyperlipidemia associated with type 2 diabetes mellitus ([place]) 10/18/2015  . Hypertension associated with diabetes ([place]) 12/02/2010   D/c acei 12/22/2011 due to psuedowheeze and narcotic dependent cough> ? Improved - 37/90/2409 started bystolic in place of cozar due to cough    . IBS (irritable bowel syndrome)   . Increased urinary protein excretion   . Internal hemorrhoids    Colonoscopy 5/12.  . Iron deficiency anemia 10/01/2017  . Joint pain   . Mixed diabetic hyperlipidemia associated with type 2 diabetes mellitus ([place]) 10/01/2017  . On home oxygen therapy    "5L at night" (10/05/2017)  . OSA on CPAP   . Osteoarthritis    "back" (10/05/2017)  . Oxygen dependent 10/16/2011  . Pneumonia    "lots of times" (10/05/2017)  . PONV (postoperative nausea and vomiting)   . Poorly controlled diabetes mellitus ([place]) 11/12/2017  . Pulmonary infiltrates 12/22/2011   Followed in Pulmonary clinic/ [facility]/ [place]    - See CT Chest 05/05/11   . Shortness of breath   . Tachycardia    never had test done since no insurance  . Tobacco use disorder-current smoker greater than 40-pack-year history- since age 30 1 ppd 10/01/2017  . Type II diabetes mellitus ([place])   . Vitamin D deficiency     Past Surgical History:  Procedure Laterality Date  . CESAREAN SECTION  1988; 1989  . CHOLECYSTECTOMY OPEN  1990  . COLONOSCOPY  09/16/2010   BDZ:HGDJME colon/small internal hemorrhoids  . ESOPHAGOGASTRODUODENOSCOPY  09/16/2010   SLF: normal/mild gastritis  . ESOPHAGOGASTRODUODENOSCOPY N/A 10/19/2014   Procedure: ESOPHAGOGASTRODUODENOSCOPY (EGD);  Surgeon:  Danie Binder, MD;  Location: [facility] ENDO SUITE;  Service: Endoscopy;  Laterality: N/A;  830  . FRACTURE SURGERY    . HYSTEROSCOPY WITH THERMACHOICE  01/17/2012   Procedure: HYSTEROSCOPY WITH THERMACHOICE;  Surgeon: Florian Buff, MD;  Location: [facility] ORS;  Service: Gynecology;  Laterality: N/A;  total therapy time: 9:13sec  D5W  18 ml in, D5W   50m out, temperture 87degrees celcious  . KIDNEY SURGERY     as child for blockages  . LEFT HEART CATH AND CORONARY ANGIOGRAPHY N/A 09/08/2019   Procedure: LEFT HEART CATH AND CORONARY ANGIOGRAPHY;  Surgeon: JMartinique Peter M, MD;  [facility];  Service: Cardiovascular;  Laterality: N/A;  . TONSILLECTOMY    . TUBAL LIGATION  1989  . TYMPANOSTOMY TUBE PLACEMENT Bilateral    "several  times when I was a child"  . uterine ablation    . WRIST FRACTURE SURGERY Left 1995    Family Psychiatric History: see below  Family History:  Family History  Problem Relation Age of Onset  . Heart attack Father 37       deceased, etoh use  . Heart disease Father   . Alcohol abuse Father   . Depression Father   . Heart failure Father   . Sudden death Father   . Heart attack Mother 24       deceased  . Diabetes Mother   . Breast cancer Mother   . Heart failure Mother        oxygen dependence, nonsmoker  . Heart disease Mother   . Depression Mother   . Cancer Mother   . Hypertension Mother   . Hyperlipidemia Mother   . Sudden death Mother   . Sleep apnea Mother   . Obesity Mother   . Liver disease Maternal Aunt 32       died while on liver transplant list  . Heart attack Maternal Grandmother        premature CAD  . Ulcers Sister   . Hypertension Sister   . Heart failure Sister   . Depression Sister   . Anxiety disorder Sister   . Colon cancer Neg Hx     Social History:  Social History   Socioeconomic History  . Marital status: Married    Spouse name: Not on file  . Number of children: 2  . Years of education: Not on file  .  Highest education level: Not on file  Occupational History  . Occupation: unemployed  Tobacco Use  . Smoking status: Current Every Day Smoker    Packs/day: 1.50    Years: 40.00    Pack years: 60.00    Types: Cigarettes    Start date: 53  . Smokeless tobacco: Never Used  Vaping Use  . Vaping Use: Former  Substance and Sexual Activity  . Alcohol use: Yes    Comment: once a month - 3-4 mixed drinks  . Drug use: Yes    Types: Marijuana    Comment: daily use  . Sexual activity: Yes    Partners: Male    Birth control/protection: Surgical  Other Topics Concern  . Not on file  Social History Narrative  . Not on file   Social Determinants of Health   Financial Resource Strain: Low Risk   . Difficulty of Paying Living Expenses: Not hard at all  Food Insecurity: No Food Insecurity  . Worried About Charity fundraiser in the Last Year: Never true  . Ran Out of Food in the Last Year: Never true  Transportation Needs: No Transportation Needs  . Lack of Transportation (Medical): No  . Lack of Transportation (Non-Medical): No  Physical Activity: Inactive  . Days of Exercise per Week: 0 days  . Minutes of Exercise per Session: 0 min  Stress: Not on file  Social Connections: Moderately Isolated  . Frequency of Communication with Friends and Family: More than three times a week  . Frequency of Social Gatherings with Friends and Family: More than three times a week  . Attends Religious Services: Never  . Active Member of Clubs or Organizations: No  . Attends Archivist Meetings: Never  . Marital Status: Married    Allergies:  Allergies  Allergen Reactions  . Codeine Itching and Nausea Only  . Metformin And Related  Diarrhea and Other (See Comments)    Stomach pain/nausea  . Wellbutrin [Bupropion Hcl] Hives  . Acyclovir And Related Rash    Metabolic Disorder Labs: Lab Results  Component Value Date   HGBA1C 7.6 (H) 06/30/2019   MPG 188.64 03/05/2019   MPG 160 (H)  03/10/2015   No results found for: PROLACTIN Lab Results  Component Value Date   CHOL 151 06/30/2019   TRIG 98 06/30/2019   HDL 48 06/30/2019   CHOLHDL 3.4 02/12/2018   VLDL 35 10/06/2017   LDLCALC 85 06/30/2019   LDLCALC 74 02/12/2018   Lab Results  Component Value Date   TSH 1.860 06/30/2019   TSH 2.40 11/26/2018    Therapeutic Level Labs: No results found for: LITHIUM No results found for: VALPROATE No components found for:  CBMZ  Current Medications: Current Outpatient Medications  Medication Sig Dispense Refill  . ACCU-CHEK GUIDE test strip USE UP TO FOUR TIMES DAILY AS DIRECTED 200 strip 11  . Accu-Chek Softclix Lancets lancets USE UP TO FOUR TIMES DAILY AS DIRECTED 300 each 0  . albuterol (PROVENTIL) (2.5 MG/3ML) 0.083% nebulizer solution Take 3 mLs (2.5 mg total) by nebulization every 6 (six) hours as needed for wheezing or shortness of breath. 75 mL 12  . albuterol (VENTOLIN HFA) 108 (90 Base) MCG/ACT inhaler Inhale 2 puffs into the lungs every 6 (six) hours as needed for wheezing or shortness of breath.    . amphetamine-dextroamphetamine (ADDERALL) 30 MG tablet Take 1 tablet by mouth 2 (two) times daily. 60 tablet 0  . amphetamine-dextroamphetamine (ADDERALL) 30 MG tablet Take 1 tablet by mouth 2 (two) times daily. 60 tablet 0  . amphetamine-dextroamphetamine (ADDERALL) 30 MG tablet Take 1 tablet by mouth 2 (two) times daily. 60 tablet 0  . ARIPiprazole (ABILIFY) 5 MG tablet Take 1 tablet (5 mg total) by mouth daily. 30 tablet 2  . aspirin EC 81 MG tablet Take 1 tablet (81 mg total) by mouth daily. 90 tablet 3  . atorvastatin (LIPITOR) 40 MG tablet Take 1 tablet (40 mg total) by mouth daily. 30 tablet 2  . baclofen (LIORESAL) 10 MG tablet Take 0.5-1 tablets (5-10 mg total) by mouth 3 (three) times daily as needed for muscle spasms. 30 each 3  . benzonatate (TESSALON) 100 MG capsule Take 1 capsule (100 mg total) by mouth every 8 (eight) hours. 21 capsule 0  . blood  glucose meter kit and supplies Dispense based on patient and insurance preference. Use up to four times daily as directed. (FOR ICD-10 E10.9, E11.9). 1 each 0  . Cholecalciferol (VITAMIN D-3) 125 MCG (5000 UT) TABS Take 5,000 Units by mouth daily.    . ciprofloxacin-dexamethasone (CIPRODEX) OTIC suspension Place 4-5 drops into the right ear 2 (two) times daily as needed (drainage).    . clonazePAM (KLONOPIN) 0.5 MG tablet Take 1 tablet (0.5 mg total) by mouth 2 (two) times daily. 60 tablet 2  . cycloSPORINE (RESTASIS) 0.05 % ophthalmic emulsion Place 1 drop into both eyes 2 (two) times daily.    . dapagliflozin propanediol (FARXIGA) 10 MG TABS tablet Take 1 tablet (10 mg total) by mouth daily. 90 tablet 1  . doxycycline (VIBRAMYCIN) 100 MG capsule Take 1 capsule (100 mg total) by mouth 2 (two) times daily. 20 capsule 0  . DULoxetine (CYMBALTA) 60 MG capsule Take 1 capsule (60 mg total) by mouth daily. 60 capsule 2  . ezetimibe (ZETIA) 10 MG tablet Take 1 tablet (10 mg total) by mouth  daily. 90 tablet 3  . fluticasone (FLONASE) 50 MCG/ACT nasal spray INSTILL 1 SPRAY INTO EACH NOSTRIL TWICE DAILY AFTER SINUS RINSES. PLEASE SCHEDULE APPOINTMENT FOR FURTHER REFILLS 48 g 2  . furosemide (LASIX) 40 MG tablet AS NEEDED for weight increase of 3 lbs overnight or 5 lbs in 1 week 90 tablet 3  . guaiFENesin-dextromethorphan (ROBITUSSIN DM) 100-10 MG/5ML syrup Take 5 mLs by mouth every 4 (four) hours as needed for cough. 118 mL 0  . ibuprofen (ADVIL) 200 MG tablet Take 200 mg by mouth every 6 (six) hours as needed.    . Insulin Pen Needle (BD PEN NEEDLE NANO 2ND GEN) 32G X 4 MM MISC USE DAILY WITH VICTOZA 100 each 11  . isosorbide mononitrate (IMDUR) 30 MG 24 hr tablet TAKE 1 TABLET BY MOUTH EVERY DAY 90 tablet 3  . Lancets Misc. (ACCU-CHEK SOFTCLIX LANCET DEV) KIT Check Blood sugar up to 4 times a day 1 kit 0  . losartan (COZAAR) 25 MG tablet TAKE 1 TABLET(25 MG) BY MOUTH DAILY 90 tablet 0  . meloxicam (MOBIC)  15 MG tablet Take 1 tablet (15 mg total) by mouth daily. 90 tablet 0  . metoprolol tartrate (LOPRESSOR) 25 MG tablet Take 1 tablet (25 mg total) by mouth 2 (two) times daily. 180 tablet 3  . Multiple Vitamins-Minerals (MULTIVITAMIN WITH MINERALS) tablet Take 1 tablet by mouth daily.    . nabumetone (RELAFEN) 500 MG tablet Take 1 tablet (500 mg total) by mouth 2 (two) times daily as needed. 60 tablet 3  . nitroGLYCERIN (NITROSTAT) 0.4 MG SL tablet Place 1 tablet (0.4 mg total) under the tongue every 5 (five) minutes as needed for chest pain. 25 tablet 3  . pantoprazole (PROTONIX) 40 MG tablet TAKE 1 TABLET BY MOUTH EVERY DAY 30 MINUTES BEFORE A MEAL 90 tablet 3  . predniSONE (DELTASONE) 50 MG tablet Take 1 tablet (50 mg total) by mouth daily with breakfast. 5 tablet 0  . pregabalin (LYRICA) 150 MG capsule TAKE 1 CAPSULE BY MOUTH TWICE DAILY 180 capsule 3  . VICTOZA 18 MG/3ML SOPN ADMINISTER 1.8 MG UNDER THE SKIN DAILY 27 mL 0   Current Facility-Administered Medications  Medication Dose Route Frequency Provider Last Rate Last Admin  . sodium chloride flush (NS) 0.9 % injection 3 mL  3 mL Intravenous Q12H Donato Heinz, MD         Musculoskeletal: Strength & Muscle Tone: within normal limits Gait & Station: normal Patient leans: N/A  Psychiatric Specialty Exam: Review of Systems  Psychiatric/Behavioral: The patient is nervous/anxious.   All other systems reviewed and are negative.   Last menstrual period 08/01/2014.There is no height or weight on file to calculate BMI.  General Appearance: NA  Eye Contact:  NA  Speech:  Clear and Coherent  Volume:  Normal  Mood:  Anxious  Affect:  NA  Thought Process:  Goal Directed  Orientation:  Full (Time, Place, and Person)  Thought Content: Rumination   Suicidal Thoughts:  No  Homicidal Thoughts:  No  Memory:  Immediate;   Good Recent;   Good Remote;   Good  Judgement:  Good  Insight:  Good  Psychomotor Activity:  Normal   Concentration:  Concentration: Good and Attention Span: Good  Recall:  Good  Fund of Knowledge: Good  Language: Good  Akathisia:  No  Handed:  Right  AIMS (if indicated): not done  Assets:  Communication Skills Desire for Improvement Physical Health Resilience Social Support Talents/Skills  ADL's:  Intact  Cognition: WNL  Sleep:  Good   Screenings: AIMS   Flowsheet Row Admission (Discharged) from 03/07/2019 in [place] Admission (Discharged) from 09/12/2014 in [place] [facility]  AIMS Total Score 0 0    AUDIT   Flowsheet Row Admission (Discharged) from 03/07/2019 in [place] Admission (Discharged) from 09/12/2014 in [place] [facility]  Alcohol Use Disorder Identification Test Final Score (AUDIT) 0 0    GAD-7   Flowsheet Row Office Visit from 11/06/2019 in [place]  Total GAD-7 Score 5    PHQ2-9   [place] Visit from 11/06/2019 in [place] from 10/30/2019 in [place] Visit from 06/30/2019 in [place] Office Visit from 06/20/2019 in [place] Visit from 09/24/2018 in [place] at [facility]  PHQ-2 Total Score 0 0 5 0 2  PHQ-9 Total Score 6 -- 17 -- 9    Flowsheet Row Admission (Discharged) from 03/07/2019 in [place] ED to Hosp-Admission (Discharged) from 03/04/2019 in [place] [facility]  C-SSRS RISK CATEGORY High Risk Error: Q3, 4, or 5 should not be populated when Q2 is No       Assessment and Plan: This patient is a 55 year old female with a history of depression anxiety and ADD.  She continues to do well in terms of focus and mood despite the recent stressors at home.  She will continue Cymbalta 60 mg daily for depression, Abilify 5 mg daily for  augmentation, clonazepam 0.5 mg twice daily for anxiety and Adderall 30 mg twice daily for ADD.  She will return to see me in 3 months   [name], MD 08/23/2020, 2:32 PM

## 2020-08-24 DIAGNOSIS — G4733 Obstructive sleep apnea (adult) (pediatric): Secondary | ICD-10-CM | POA: Diagnosis not present

## 2020-09-03 ENCOUNTER — Encounter: Payer: Self-pay | Admitting: Pharmacist

## 2020-09-03 DIAGNOSIS — Z91199 Patient's noncompliance with other medical treatment and regimen due to unspecified reason: Secondary | ICD-10-CM

## 2020-09-03 NOTE — Progress Notes (Signed)
[facility])                                            [place] Team                                        Statin Quality Measure Assessment    09/03/2020  [name] 13-Jan-1966 [id number]  Per review of chart and payor information, this patient has been flagged for non-adherence to the following CMS Quality Measure:   [x]  Statin Use in Persons with Diabetes (SUPD)  [x]  Statin Use in Persons with Cardiovascular Disease [facility])   Currently prescribed statin:  [x]  Yes []  No     Atorvastatin 40mg  (could not tolerate 80mg  per notes)  Per payor report, no claim for statin in 2022.    No future appts scheduled at this time with PCP or cardiologist.  Please consider discussion with patient regarding statin compliance and send in new prescription if statin remains clinically appropriate.   Thanks, Ralene Bathe, PharmD, [place] [phone]

## 2020-09-12 DIAGNOSIS — G4733 Obstructive sleep apnea (adult) (pediatric): Secondary | ICD-10-CM | POA: Diagnosis not present

## 2020-09-16 ENCOUNTER — Other Ambulatory Visit: Payer: Self-pay | Admitting: Family Medicine

## 2020-09-16 DIAGNOSIS — E1142 Type 2 diabetes mellitus with diabetic polyneuropathy: Secondary | ICD-10-CM

## 2020-09-21 ENCOUNTER — Telehealth: Payer: Self-pay

## 2020-09-21 NOTE — Telephone Encounter (Signed)
PA for Pregabalin 150 mg capsules submitted to [facility] through cover my meds.  Awaiting on response. Dm/cma  Key: [id number]

## 2020-09-21 NOTE — Telephone Encounter (Signed)
PA approved through cover my meds from 04/24/20-04/23/21. Pharmacy notified [place] phone. Dm/cma

## 2020-09-24 ENCOUNTER — Other Ambulatory Visit: Payer: Self-pay | Admitting: Family Medicine

## 2020-09-24 DIAGNOSIS — E1159 Type 2 diabetes mellitus with other circulatory complications: Secondary | ICD-10-CM

## 2020-09-24 DIAGNOSIS — G4733 Obstructive sleep apnea (adult) (pediatric): Secondary | ICD-10-CM | POA: Diagnosis not present

## 2020-09-24 DIAGNOSIS — E1143 Type 2 diabetes mellitus with diabetic autonomic (poly)neuropathy: Secondary | ICD-10-CM

## 2020-09-24 DIAGNOSIS — E1169 Type 2 diabetes mellitus with other specified complication: Secondary | ICD-10-CM

## 2020-09-24 DIAGNOSIS — I152 Hypertension secondary to endocrine disorders: Secondary | ICD-10-CM

## 2020-09-24 NOTE — Telephone Encounter (Signed)
30 days worth sent previously. My chart message sent to patient to call and schedule a follow up appt Dm/cma

## 2020-09-25 ENCOUNTER — Other Ambulatory Visit: Payer: Self-pay | Admitting: Family Medicine

## 2020-09-25 DIAGNOSIS — K219 Gastro-esophageal reflux disease without esophagitis: Secondary | ICD-10-CM

## 2020-10-01 ENCOUNTER — Ambulatory Visit (INDEPENDENT_AMBULATORY_CARE_PROVIDER_SITE_OTHER): Payer: Medicare HMO | Admitting: Family Medicine

## 2020-10-01 ENCOUNTER — Other Ambulatory Visit: Payer: Self-pay

## 2020-10-01 NOTE — Progress Notes (Signed)
Appt cancelled

## 2020-10-13 DIAGNOSIS — G4733 Obstructive sleep apnea (adult) (pediatric): Secondary | ICD-10-CM | POA: Diagnosis not present

## 2020-10-20 ENCOUNTER — Encounter: Payer: Self-pay | Admitting: Family Medicine

## 2020-10-20 ENCOUNTER — Other Ambulatory Visit: Payer: Self-pay

## 2020-10-20 ENCOUNTER — Ambulatory Visit (INDEPENDENT_AMBULATORY_CARE_PROVIDER_SITE_OTHER): Payer: Medicare HMO | Admitting: Family Medicine

## 2020-10-20 VITALS — BP 112/58 | HR 86 | Temp 97.3°F | Wt 269.6 lb

## 2020-10-20 DIAGNOSIS — M5441 Lumbago with sciatica, right side: Secondary | ICD-10-CM

## 2020-10-20 DIAGNOSIS — E559 Vitamin D deficiency, unspecified: Secondary | ICD-10-CM

## 2020-10-20 DIAGNOSIS — I152 Hypertension secondary to endocrine disorders: Secondary | ICD-10-CM

## 2020-10-20 DIAGNOSIS — E1159 Type 2 diabetes mellitus with other circulatory complications: Secondary | ICD-10-CM | POA: Diagnosis not present

## 2020-10-20 DIAGNOSIS — M25511 Pain in right shoulder: Secondary | ICD-10-CM

## 2020-10-20 DIAGNOSIS — G8929 Other chronic pain: Secondary | ICD-10-CM

## 2020-10-20 DIAGNOSIS — M5442 Lumbago with sciatica, left side: Secondary | ICD-10-CM | POA: Diagnosis not present

## 2020-10-20 DIAGNOSIS — R42 Dizziness and giddiness: Secondary | ICD-10-CM | POA: Diagnosis not present

## 2020-10-20 DIAGNOSIS — E1169 Type 2 diabetes mellitus with other specified complication: Secondary | ICD-10-CM

## 2020-10-20 MED ORDER — MELOXICAM 15 MG PO TABS: 15.0000 mg | ORAL_TABLET | Freq: Every day | ORAL | 0 refills | Status: DC

## 2020-10-20 MED ORDER — ATORVASTATIN CALCIUM 20 MG PO TABS: 20.0000 mg | ORAL_TABLET | Freq: Every day | ORAL | 3 refills | Status: DC

## 2020-10-20 MED ORDER — VICTOZA 18 MG/3ML ~~LOC~~ SOPN: 1.8000 mg | PEN_INJECTOR | Freq: Every day | SUBCUTANEOUS | 5 refills | Status: DC

## 2020-10-20 NOTE — Patient Instructions (Signed)
Decrease losartan to 12.5mg  (1/2 of 25mg  tab) daily Continue with your metoprolol 25mg  once per day  Continue imdur 30mg  daily Check BP at home  - at least 1 hour after taking meds Send me readings in 2 weeks via MyChart  Increase water intake - goal of 64 oz per day

## 2020-10-20 NOTE — Progress Notes (Signed)
Chief Complaint  Patient presents with   Follow-up    6 month f/u Pt states she has been experiencing dizzy spells that comes and go over the past few months. Notices it more when raising arms above her head, at times when standing, and sometimes when she stands to fast. Pt states she has also fell a couple of times due to this.     HPI: *[name] is a 55 y.o. female here for DM follow-up.  For DM, she is on farxiga 15m daily, victoza 1.877mdaily. For HTN, she is taking losartan 2571maily, imdur 34m31mily, metoprolol 25mg67mly (Rx'd BID)  Pt does check BS at home. Readings: 150's fasting Hypoglycemia/Hypergylcemic episodes: no  Exercise: was going to [employer] granddaughter for 2 wks  Lightheadedness - episodic, when she stands or changes position, sometimes if she raises arms above her head. No associated symptoms. Does not check BS or BP at these times. Does not drink much water.   Quit smoking cigarettes 2 mo ago. She is still vaping.   Lab Results  Component Value Date   HGBA1C 9.7 (H) 10/20/2020   Lab Results  Component Value Date   MICROALBUR <0.7 10/20/2020   Lab Results  Component Value Date   CREATININE 0.76 10/20/2020   Lab Results  Component Value Date   CHOL 215 (H) 10/20/2020   HDL 43.10 10/20/2020   LDLCALC 143 (H) 10/20/2020   LDLDIRECT 121 (H) 10/03/2013   TRIG 146.0 10/20/2020   CHOLHDL 5 10/20/2020    The 10-year ASCVD risk score (GoffMikey Bussingr., et al., 2013) is: 4.9%   Values used to calculate the score:     Age: 74 ye43s     Sex: Female     Is Non-Hispanic African American: No     Diabetic: Yes     Tobacco smoker: No     Systolic Blood Pressure: 112 m606     Is BP treated: Yes     HDL Cholesterol: 43.1 mg/dL     Total Cholesterol: 215 mg/dL   Past Medical History:  Diagnosis Date   ADHD    Alcohol abuse    Anemia    Anxiety    Asthmatic bronchitis    normal PFT/ seen by pulmonary no evidence of COPD    Atypical chest pain 10/05/2017   Back pain    Bilateral swelling of feet    Chest pain    Chest pain on respiration 03/25/2014   Chronic bronchitis (HCC)    Chronic lower back pain    Chronic respiratory failure (HCC) [place]4/2013   Newly 02 dep 24/7 p discharge from [id number]  - 03/13/2014  Walked RA  2 laps @ 185 ft each stopped due to  Sob/ aching in legs, thirsty/ no desat @ slow pace    Chronic respiratory failure with hypoxia (HCC)    On 2-3 L of oxygen at home   Cigarette smoker 12/02/2010   Followed in Pulmonary clinic/ [facility]/ [place]   - Limits of effective care reviewed 8/30/7/70/3403Complication of anesthesia    Constipation    COPD (chronic obstructive pulmonary disease) (HCC) [place] ([place]/2019   Daily headache    Depression    Diabetic peripheral neuropathy (HCC) [place] (diabetes mellitus) type II controlled, neurological manifestation (HCC) [place]0/2012   Drug use    Gallbladder problem    Gastric erosions  EGD 08/2010.   GERD (gastroesophageal reflux disease)    Glaucoma    H/O drug abuse ([place]) 11/12/2017   -- scanned document from outside source: [facility] in [place] which showed UDS positive for Adderall /amphetamine usage as well as positive urine for THC.  This test result was collected 08/11/2016 and reported 10/07/2016. - lso review of the chart shows another positive amphetamine, THC and METH in the urine back on 10/18/2015 under "care everywhere". ---So    Heavy menses    High cholesterol    History of blood transfusion    "related to low HgB" (10/05/2017)   History of hiatal hernia    HTN (hypertension)    Hyperlipidemia associated with type 2 diabetes mellitus ([place]) 10/18/2015   Hypertension associated with diabetes ([place]) 12/02/2010   D/c acei 12/22/2011 due to psuedowheeze and narcotic dependent cough> ? Improved - 54/65/6812 started bystolic in place of cozar due to cough     IBS  (irritable bowel syndrome)    Increased urinary protein excretion    Internal hemorrhoids    Colonoscopy 5/12.   Iron deficiency anemia 10/01/2017   Joint pain    Mixed diabetic hyperlipidemia associated with type 2 diabetes mellitus ([place]) 10/01/2017   On home oxygen therapy    "5L at night" (10/05/2017)   OSA on CPAP    Osteoarthritis    "back" (10/05/2017)   Oxygen dependent 10/16/2011   Pneumonia    "lots of times" (10/05/2017)   PONV (postoperative nausea and vomiting)    Poorly controlled diabetes mellitus ([place]) 11/12/2017   Pulmonary infiltrates 12/22/2011   Followed in Pulmonary clinic/ [facility]/ [place]    - See CT Chest 05/05/11    Shortness of breath    Tachycardia    never had test done since no insurance   Tobacco use disorder-current smoker greater than 40-pack-year history- since age 55 1 ppd 10/01/2017   Type II diabetes mellitus ([place])    Vitamin D deficiency     Past Surgical History:  Procedure Laterality Date   CESAREAN SECTION  1988; [place]   COLONOSCOPY  09/16/2010   XNT:ZGYFVC colon/small internal hemorrhoids   ESOPHAGOGASTRODUODENOSCOPY  09/16/2010   SLF: normal/mild gastritis   ESOPHAGOGASTRODUODENOSCOPY N/A 10/19/2014   Procedure: ESOPHAGOGASTRODUODENOSCOPY (EGD);  Surgeon: Danie Binder, MD;  Location[facility];  Service: Endoscopy;  Laterality: N/A;  [place]  01/17/2012   Procedure: HYSTEROSCOPY WITH THERMACHOICE;  Surgeon: Florian Buff, MD;  Location: [facility] ORS;  Service: Gynecology;  Laterality: N/A;  total therapy time: 9:13sec  D5W  18 ml in, D5W   98m out, temperture 87degrees celcious   KIDNEY SURGERY     as child for blockages   LEFT HEART CATH AND CORONARY ANGIOGRAPHY N/A 09/08/2019   Procedure: LEFT HEART CATH AND CORONARY ANGIOGRAPHY;  Surgeon: JMartinique Peter M, MD;  [facility];  Service: Cardiovascular;  Laterality: N/A;   TONSILLECTOMY     TUBAL  LIGATION  1989   TYMPANOSTOMY TUBE PLACEMENT Bilateral    "several times when I was a child"   uterine ablation     WRIST FRACTURE SURGERY Left 1995    Social History   Socioeconomic History   Marital status: Married    Spouse name: Not on file   Number of children: 2   Years of education: Not on file  Highest education level: Not on file  Occupational History   Occupation: unemployed  Tobacco Use   Smoking status: Former    Packs/day: 1.50    Years: 40.00    Pack years: 60.00    Types: Cigarettes    Start date: 1980   Smokeless tobacco: Current  Vaping Use   Vaping Use: Every day  Substance and Sexual Activity   Alcohol use: Not Currently    Comment: once a month - 3-4 mixed drinks   Drug use: Yes    Types: Marijuana    Comment: daily use   Sexual activity: Yes    Partners: Male    Birth control/protection: Surgical  Other Topics Concern   Not on file  Social History Narrative   Not on file   Social Determinants of Health   Financial Resource Strain: Low Risk    Difficulty of Paying Living Expenses: Not hard at all  Food Insecurity: No Food Insecurity   Worried About Charity fundraiser in the Last Year: Never true   Ran Out of Food in the Last Year: Never true  Transportation Needs: No Transportation Needs   Lack of Transportation (Medical): No   Lack of Transportation (Non-Medical): No  Physical Activity: Inactive   Days of Exercise per Week: 0 days   Minutes of Exercise per Session: 0 min  Stress: Not on file  Social Connections: Moderately Isolated   Frequency of Communication with Friends and Family: More than three times a week   Frequency of Social Gatherings with Friends and Family: More than three times a week   Attends Religious Services: Never   Marine scientist or Organizations: No   Attends Archivist Meetings: Never   Marital Status: Married  Human resources officer Violence: Not on file    Family History  Problem Relation Age  of Onset   Heart attack Father 62       deceased, etoh use   Heart disease Father    Alcohol abuse Father    Depression Father    Heart failure Father    Sudden death Father    Heart attack Mother 76       deceased   Diabetes Mother    Breast cancer Mother    Heart failure Mother        oxygen dependence, nonsmoker   Heart disease Mother    Depression Mother    Cancer Mother    Hypertension Mother    Hyperlipidemia Mother    Sudden death Mother    Sleep apnea Mother    Obesity Mother    Liver disease Maternal Aunt 43       died while on liver transplant list   Heart attack Maternal Grandmother        premature CAD   Ulcers Sister    Hypertension Sister    Heart failure Sister    Depression Sister    Anxiety disorder Sister    Colon cancer Neg Hx      Immunization History  Administered Date(s) Administered   Influenza Split 01/11/2012, 04/08/2016   Influenza,inj,Quad PF,6+ Mos 12/31/2012, 01/16/2014, 03/10/2015, 03/11/2018, 03/13/2019   Influenza-Unspecified 01/29/2017   PFIZER(Purple Top)SARS-COV-2 Vaccination 10/17/2019   Pneumococcal Polysaccharide-23 02/20/2012   Tdap 03/11/2018    Outpatient Encounter Medications as of 10/20/2020  Medication Sig   ACCU-CHEK GUIDE test strip USE UP TO FOUR TIMES DAILY AS DIRECTED   Accu-Chek Softclix Lancets lancets USE UP TO FOUR TIMES DAILY AS DIRECTED  albuterol (VENTOLIN HFA) 108 (90 Base) MCG/ACT inhaler Inhale 2 puffs into the lungs every 6 (six) hours as needed for wheezing or shortness of breath.   amphetamine-dextroamphetamine (ADDERALL) 30 MG tablet Take 1 tablet by mouth 2 (two) times daily.   pantoprazole (PROTONIX) 40 MG tablet Take 1 tablet by mouth daily.   ARIPiprazole (ABILIFY) 5 MG tablet Take 1 tablet (5 mg total) by mouth daily.   aspirin EC 81 MG tablet Take 1 tablet (81 mg total) by mouth daily.   atorvastatin (LIPITOR) 20 MG tablet Take 1 tablet (20 mg total) by mouth daily.   blood glucose meter kit and  supplies Dispense based on patient and insurance preference. Use up to four times daily as directed. (FOR ICD-10 E10.9, E11.9).   clonazePAM (KLONOPIN) 0.5 MG tablet Take 1 tablet (0.5 mg total) by mouth 2 (two) times daily.   dapagliflozin propanediol (FARXIGA) 10 MG TABS tablet Take 1 tablet (10 mg total) by mouth daily.   DULoxetine (CYMBALTA) 60 MG capsule Take 1 capsule (60 mg total) by mouth daily.   ezetimibe (ZETIA) 10 MG tablet Take 1 tablet (10 mg total) by mouth daily.   fluticasone (FLONASE) 50 MCG/ACT nasal spray INSTILL 1 SPRAY INTO EACH NOSTRIL TWICE DAILY AFTER SINUS RINSES. PLEASE SCHEDULE APPOINTMENT FOR FURTHER REFILLS   furosemide (LASIX) 40 MG tablet AS NEEDED for weight increase of 3 lbs overnight or 5 lbs in 1 week   ibuprofen (ADVIL) 200 MG tablet Take 200 mg by mouth every 6 (six) hours as needed.   Insulin Pen Needle (BD PEN NEEDLE NANO 2ND GEN) 32G X 4 MM MISC USE DAILY WITH VICTOZA   isosorbide mononitrate (IMDUR) 30 MG 24 hr tablet TAKE 1 TABLET BY MOUTH EVERY DAY   Lancets Misc. (ACCU-CHEK SOFTCLIX LANCET DEV) KIT Check Blood sugar up to 4 times a day   liraglutide (VICTOZA) 18 MG/3ML SOPN Inject 1.8 mg into the skin daily.   losartan (COZAAR) 25 MG tablet TAKE 1 TABLET(25 MG) BY MOUTH DAILY.   meloxicam (MOBIC) 15 MG tablet Take 1 tablet (15 mg total) by mouth daily.   metoprolol tartrate (LOPRESSOR) 25 MG tablet Take 1 tablet (25 mg total) by mouth 2 (two) times daily.   Multiple Vitamins-Minerals (MULTIVITAMIN WITH MINERALS) tablet Take 1 tablet by mouth daily.   nitroGLYCERIN (NITROSTAT) 0.4 MG SL tablet Place 1 tablet (0.4 mg total) under the tongue every 5 (five) minutes as needed for chest pain.   pregabalin (LYRICA) 150 MG capsule TAKE 1 CAPSULE BY MOUTH TWICE DAILY   [DISCONTINUED] amphetamine-dextroamphetamine (ADDERALL) 30 MG tablet Take 1 tablet by mouth 2 (two) times daily. (Patient not taking: Reported on 10/20/2020)   [DISCONTINUED]  amphetamine-dextroamphetamine (ADDERALL) 30 MG tablet Take 1 tablet by mouth 2 (two) times daily. (Patient not taking: Reported on 10/20/2020)   [DISCONTINUED] atorvastatin (LIPITOR) 40 MG tablet Take 1 tablet (40 mg total) by mouth daily. (Patient not taking: Reported on 10/20/2020)   [DISCONTINUED] baclofen (LIORESAL) 10 MG tablet Take 0.5-1 tablets (5-10 mg total) by mouth 3 (three) times daily as needed for muscle spasms. (Patient not taking: Reported on 10/20/2020)   [DISCONTINUED] benzonatate (TESSALON) 100 MG capsule Take 1 capsule (100 mg total) by mouth every 8 (eight) hours. (Patient not taking: Reported on 10/20/2020)   [DISCONTINUED] Cholecalciferol (VITAMIN D-3) 125 MCG (5000 UT) TABS Take 5,000 Units by mouth daily. (Patient not taking: Reported on 10/20/2020)   [DISCONTINUED] ciprofloxacin-dexamethasone (CIPRODEX) OTIC suspension Place 4-5 drops into the  right ear 2 (two) times daily as needed (drainage). (Patient not taking: Reported on 10/20/2020)   [DISCONTINUED] cycloSPORINE (RESTASIS) 0.05 % ophthalmic emulsion Place 1 drop into both eyes 2 (two) times daily. (Patient not taking: Reported on 10/20/2020)   [DISCONTINUED] doxycycline (VIBRAMYCIN) 100 MG capsule Take 1 capsule (100 mg total) by mouth 2 (two) times daily. (Patient not taking: Reported on 10/20/2020)   [DISCONTINUED] guaiFENesin-dextromethorphan (ROBITUSSIN DM) 100-10 MG/5ML syrup Take 5 mLs by mouth every 4 (four) hours as needed for cough. (Patient not taking: Reported on 10/20/2020)   [DISCONTINUED] meloxicam (MOBIC) 15 MG tablet Take 1 tablet (15 mg total) by mouth daily. (Patient not taking: Reported on 10/20/2020)   [DISCONTINUED] nabumetone (RELAFEN) 500 MG tablet Take 1 tablet (500 mg total) by mouth 2 (two) times daily as needed. (Patient not taking: Reported on 10/20/2020)   [DISCONTINUED] pantoprazole (PROTONIX) 40 MG tablet TAKE 1 TABLET BY MOUTH EVERY DAY 30 MINUTES BEFORE A MEAL   [DISCONTINUED] predniSONE (DELTASONE)  50 MG tablet Take 1 tablet (50 mg total) by mouth daily with breakfast. (Patient not taking: Reported on 10/20/2020)   [DISCONTINUED] VICTOZA 18 MG/3ML SOPN ADMINISTER 1.8 MG UNDER THE SKIN DAILY (Patient not taking: Reported on 10/20/2020)   Facility-Administered Encounter Medications as of 10/20/2020  Medication   sodium chloride flush (NS) 0.9 % injection 3 mL     ROS: Pertinent positives and negatives noted in HPI. Remainder of ROS non-contributory    Allergies  Allergen Reactions   Codeine Itching and Nausea Only   Metformin And Related Diarrhea and Other (See Comments)    Stomach pain/nausea   Wellbutrin [Bupropion Hcl] Hives   Acyclovir And Related Rash    BP (!) 112/58 (BP Location: Right Arm, Patient Position: Sitting, Cuff Size: Large)   Pulse 86   Temp (!) 97.3 F (36.3 C) (Temporal)   Wt 269 lb 9.6 oz (122.3 kg)   LMP 08/01/2014 Comment: irregular  SpO2 96%   BMI 46.28 kg/m   Physical Exam Constitutional:      General: She is not in acute distress.    Appearance: She is obese. She is not ill-appearing.  Cardiovascular:     Rate and Rhythm: Normal rate and regular rhythm.     Pulses: Normal pulses.  Pulmonary:     Effort: Pulmonary effort is normal. No respiratory distress.     Breath sounds: No wheezing or rhonchi.  Musculoskeletal:     Right lower leg: No edema.     Left lower leg: No edema.  Neurological:     Mental Status: She is alert and oriented to person, place, and time.     Motor: No weakness.     Coordination: Coordination normal.     Gait: Gait normal.  Psychiatric:        Behavior: Behavior normal.     A/P:  1. Type 2 diabetes mellitus with other specified complication, without long-term current use of insulin (HCC) - last A1C = 7.6 - home fasting BS's average 150 - Lipid panel - Hemoglobin A1c - Microalbumin / creatinine urine ratio Refill: - liraglutide (VICTOZA) 18 MG/3ML SOPN; Inject 1.8 mg into the skin daily.  Dispense: 27 mL;  Refill: 5 - atorvastatin (LIPITOR) 20 MG tablet; Take 1 tablet (20 mg total) by mouth daily.  Dispense: 90 tablet; Refill: 3 - cont farxiga 80m daily - stressed importance of regular CV exercise and low carb, low sugar diet. Consider nutrition referral or referral to healthy weight & wellness -  will adjust med based on A1C result once available - f/u in 3 mo or sooner PRN  2. Hypertension associated with diabetes ([place]) - pt appears to be having symptoms c/w orthostatic hypotension - decrease losartan from 72m to 12.542mdaily, cont imdur 3038maily and metoprolol 81m2mily - check BP at home daily x 1-2wks and send readings thru mychart or call - CBC - Comprehensive metabolic panel  3. Vitamin D deficiency - not on TOC Vit D - VITAMIN D 25 Hydroxy (Vit-D Deficiency, Fractures)  4. Chronic right shoulder pain - stable, at baseline Refill: - meloxicam (MOBIC) 15 MG tablet; Take 1 tablet (15 mg total) by mouth daily.  Dispense: 90 tablet; Refill: 0  5. Chronic bilateral low back pain with bilateral sciatica - at baseline, currently not taking any daily med - in the past, felt meloxicam was most effective and would like refill Refill: - meloxicam (MOBIC) 15 MG tablet; Take 1 tablet (15 mg total) by mouth daily.  Dispense: 90 tablet; Refill: 0  6. Episodic lightheadedness - likely d/t orthostatic hypotension - decrease losartan from 81mg60m12.5mg d60my, cont imdur 30mg d16m and metoprolol 81mg da38m- check BP at home daily x 1-2wks and send readings thru mychart or call - if BP still running low, will decrease or stop metoprolol - increase water intake with goal of 64oz per day

## 2020-10-21 LAB — COMPREHENSIVE METABOLIC PANEL
ALT: 15 U/L (ref 0–35)
AST: 15 U/L (ref 0–37)
Albumin: 4.1 g/dL (ref 3.5–5.2)
Alkaline Phosphatase: 65 U/L (ref 39–117)
BUN: 15 mg/dL (ref 6–23)
CO2: 27 mEq/L (ref 19–32)
Calcium: 9.2 mg/dL (ref 8.4–10.5)
Chloride: 97 mEq/L (ref 96–112)
Creatinine, Ser: 0.76 mg/dL (ref 0.40–1.20)
GFR: 88.7 mL/min (ref >60.00)
Glucose, Bld: 154 mg/dL — ABNORMAL HIGH (ref 70–99)
Potassium: 4.1 mEq/L (ref 3.5–5.1)
Sodium: 136 mEq/L (ref 135–145)
Total Bilirubin: 0.5 mg/dL (ref 0.2–1.2)
Total Protein: 7.2 g/dL (ref 6.0–8.3)

## 2020-10-21 LAB — LIPID PANEL
Cholesterol: 215 mg/dL — ABNORMAL HIGH (ref 0–200)
HDL: 43.1 mg/dL (ref >39.00)
LDL Cholesterol: 143 mg/dL — ABNORMAL HIGH (ref 0–99)
NonHDL: 172.25
Total CHOL/HDL Ratio: 5
Triglycerides: 146 mg/dL (ref 0.0–149.0)
VLDL: 29.2 mg/dL (ref 0.0–40.0)

## 2020-10-21 LAB — CBC
HCT: 43.8 % (ref 36.0–46.0)
Hemoglobin: 14.1 g/dL (ref 12.0–15.0)
MCHC: 32.1 g/dL (ref 30.0–36.0)
MCV: 83 fl (ref 78.0–100.0)
Platelets: 257 10*3/uL (ref 150.0–400.0)
RBC: 5.28 Mil/uL — ABNORMAL HIGH (ref 3.87–5.11)
RDW: 16.8 % — ABNORMAL HIGH (ref 11.5–15.5)
WBC: 15.6 10*3/uL — ABNORMAL HIGH (ref 4.0–10.5)

## 2020-10-21 LAB — MICROALBUMIN / CREATININE URINE RATIO
Creatinine,U: 55.2 mg/dL
Microalb Creat Ratio: 1.3 mg/g (ref 0.0–30.0)
Microalb, Ur: <0.7 mg/dL (ref 0.0–1.9)

## 2020-10-21 LAB — VITAMIN D 25 HYDROXY (VIT D DEFICIENCY, FRACTURES): VITD: 30.45 ng/mL (ref 30.00–100.00)

## 2020-10-21 LAB — HEMOGLOBIN A1C: Hgb A1c MFr Bld: 9.7 % — ABNORMAL HIGH (ref 4.6–6.5)

## 2020-10-22 ENCOUNTER — Telehealth (HOSPITAL_COMMUNITY): Payer: Self-pay | Admitting: Psychiatry

## 2020-10-22 NOTE — Telephone Encounter (Signed)
Patient called requesting medication to be filled early due to Pharmacy being closed over the weekend. Patient will run out of medication over the weekend and does not want to go without. Please call patient to advise of outcome.

## 2020-10-24 DIAGNOSIS — G4733 Obstructive sleep apnea (adult) (pediatric): Secondary | ICD-10-CM | POA: Diagnosis not present

## 2020-10-26 ENCOUNTER — Telehealth (HOSPITAL_COMMUNITY): Payer: Self-pay | Admitting: Psychiatry

## 2020-10-26 NOTE — Telephone Encounter (Signed)
I checked current medication status.  Dr. Harrington Challenger already had some the prescription which is due now and she can pick up from the pharmacy. Please call the patient to contact pharmacy.

## 2020-10-26 NOTE — Telephone Encounter (Signed)
Patient called in requesting refill on adderall 30mg  x2 a day. This medication is out at patients current pharmacy, refill request for adderall only to be called into the [facility] on [address] in [place].

## 2020-10-28 MED ORDER — METOPROLOL TARTRATE 25 MG PO TABS: 25.0000 mg | ORAL_TABLET | Freq: Every day | ORAL | 3 refills | Status: DC

## 2020-11-12 DIAGNOSIS — G4733 Obstructive sleep apnea (adult) (pediatric): Secondary | ICD-10-CM | POA: Diagnosis not present

## 2020-11-18 ENCOUNTER — Other Ambulatory Visit: Payer: Self-pay | Admitting: Family Medicine

## 2020-11-18 DIAGNOSIS — E1143 Type 2 diabetes mellitus with diabetic autonomic (poly)neuropathy: Secondary | ICD-10-CM

## 2020-11-18 DIAGNOSIS — E1159 Type 2 diabetes mellitus with other circulatory complications: Secondary | ICD-10-CM

## 2020-11-19 ENCOUNTER — Encounter: Payer: Self-pay | Admitting: Obstetrics & Gynecology

## 2020-11-19 ENCOUNTER — Other Ambulatory Visit: Payer: Self-pay

## 2020-11-19 ENCOUNTER — Ambulatory Visit (INDEPENDENT_AMBULATORY_CARE_PROVIDER_SITE_OTHER): Payer: Medicare HMO | Admitting: Obstetrics & Gynecology

## 2020-11-19 VITALS — BP 118/78 | HR 85 | Resp 16

## 2020-11-19 DIAGNOSIS — B372 Candidiasis of skin and nail: Secondary | ICD-10-CM | POA: Diagnosis not present

## 2020-11-19 DIAGNOSIS — N6320 Unspecified lump in the left breast, unspecified quadrant: Secondary | ICD-10-CM | POA: Diagnosis not present

## 2020-11-19 MED ORDER — NYSTATIN 100000 UNIT/GM EX POWD: 1.0000 "application " | Freq: Two times a day (BID) | CUTANEOUS | 3 refills | Status: DC

## 2020-11-19 MED ORDER — CEPHALEXIN 500 MG PO CAPS: 500.0000 mg | ORAL_CAPSULE | Freq: Two times a day (BID) | ORAL | 0 refills | Status: AC

## 2020-11-19 NOTE — Progress Notes (Signed)
    [name] 12/17/1965 [id number]        55 y.o.  [id number]   RP: Left tender breast lump x 1 1/2 week  HPI:  Left tender breast lump x 1 1/2 week.  Skin is red on top of it.  No drainage.  No fever.  C/O redness and itching under the breasts.  Mother with Breast Ca, but died of an MI.     OB History  Gravida Para Term Preterm AB Living  '2 2 2     2  '$ SAB IAB Ectopic Multiple Live Births               # Outcome Date GA Lbr Len/2nd Weight Sex Delivery Anes PTL Lv  2 Term           1 Term             Past medical history,surgical history, problem list, medications, allergies, family history and social history were all reviewed and documented in the EPIC chart.   Directed ROS with pertinent positives and negatives documented in the history of present illness/assessment and plan.  Exam:  Vitals:   11/19/20 1052  BP: 118/78  Pulse: 85  Resp: 16   General appearance:  Normal  Breast exam:  Rt Breast with erythema of the skin at the under breast fold.  No axillary LN.                        Lt Breast:  Left large tender mobile mass 4 x 5 cm  at 12 O'Clock close to nipple.  Erythema of the skin at the site of the mass.   Erythema of the skin at the under breast fold.                                           No axillary LN.   Assessment/Plan:  55 y.o. G2P2002   1. Left breast mass  Left tender breast lump x 1 1/2 week.  Left breast exam showed a  Left large tender mobile mass 4 x 5 cm  at 12 O'Clock close to nipple.  Erythema of the skin at the site of the mass.  No axillary LN.  Possible Lt breast abscess or Inflammatory Breast Ca.  Mother with h/o Breast Ca.  Schedule a Left Dx mammo/Breast US asap.  Start on Keflex 500 mg BID x 7 days.  Warm compresses.  2. Yeast infection of the skin Probable yeast of skin under both breasts.  Will treat with Nystatin powder.  Usage reviewed and prescription sent to pharmacy.  Other orders - nystatin (MYCOSTATIN/NYSTOP)  powder; Apply 1 application topically 2 (two) times daily. - cephALEXin (KEFLEX) 500 MG capsule; Take 1 capsule (500 mg total) by mouth 2 (two) times daily for 7 days.   [name] MD, 11:19 AM 11/19/2020

## 2020-11-22 ENCOUNTER — Other Ambulatory Visit: Payer: Self-pay

## 2020-11-22 ENCOUNTER — Other Ambulatory Visit: Payer: Self-pay | Admitting: Obstetrics & Gynecology

## 2020-11-22 ENCOUNTER — Ambulatory Visit
Admission: RE | Admit: 2020-11-22 | Discharge: 2020-11-22 | Disposition: A | Discharge status: Home / Self Care | Payer: Medicare HMO | Source: Ambulatory Visit | Attending: Obstetrics & Gynecology | Admitting: Obstetrics & Gynecology

## 2020-11-22 ENCOUNTER — Telehealth: Payer: Self-pay | Admitting: *Deleted

## 2020-11-22 DIAGNOSIS — N6320 Unspecified lump in the left breast, unspecified quadrant: Secondary | ICD-10-CM

## 2020-11-22 DIAGNOSIS — B372 Candidiasis of skin and nail: Secondary | ICD-10-CM

## 2020-11-22 DIAGNOSIS — N611 Abscess of the breast and nipple: Secondary | ICD-10-CM

## 2020-11-22 DIAGNOSIS — N644 Mastodynia: Secondary | ICD-10-CM | POA: Diagnosis not present

## 2020-11-22 DIAGNOSIS — R922 Inconclusive mammogram: Secondary | ICD-10-CM | POA: Diagnosis not present

## 2020-11-22 NOTE — Telephone Encounter (Signed)
Patient scheduled at breast center on 11/22/20 @ 2:00pm, left message for patient to call.

## 2020-11-22 NOTE — Telephone Encounter (Signed)
Patient informed with below.  

## 2020-11-22 NOTE — Telephone Encounter (Signed)
-----   Message from [name], MD sent at 11/19/2020 11:14 AM EDT ----- Regarding: Left Dx mammo/Breast US and Right Screening mammo Left large tender mobile mass 4 x 5 cm  at 12 O'Clock close to nipple x 1 1/2 week.  Erythema of the skin.  No fever.  Possible abscess, inflammatory Ca?  Mother had Breast Ca. Started on Keflex. Please schedule asap.

## 2020-11-24 ENCOUNTER — Telehealth (INDEPENDENT_AMBULATORY_CARE_PROVIDER_SITE_OTHER): Payer: Medicare HMO | Admitting: Psychiatry

## 2020-11-24 ENCOUNTER — Encounter (HOSPITAL_COMMUNITY): Payer: Self-pay | Admitting: Psychiatry

## 2020-11-24 ENCOUNTER — Other Ambulatory Visit: Payer: Self-pay

## 2020-11-24 DIAGNOSIS — F901 Attention-deficit hyperactivity disorder, predominantly hyperactive type: Secondary | ICD-10-CM | POA: Diagnosis not present

## 2020-11-24 DIAGNOSIS — R69 Illness, unspecified: Secondary | ICD-10-CM | POA: Diagnosis not present

## 2020-11-24 DIAGNOSIS — G4733 Obstructive sleep apnea (adult) (pediatric): Secondary | ICD-10-CM | POA: Diagnosis not present

## 2020-11-24 DIAGNOSIS — F331 Major depressive disorder, recurrent, moderate: Secondary | ICD-10-CM | POA: Diagnosis not present

## 2020-11-24 MED ORDER — CLONAZEPAM 0.5 MG PO TABS: 0.5000 mg | ORAL_TABLET | Freq: Two times a day (BID) | ORAL | 2 refills | Status: DC

## 2020-11-24 MED ORDER — AMPHETAMINE-DEXTROAMPHETAMINE 30 MG PO TABS: 30.0000 mg | ORAL_TABLET | Freq: Two times a day (BID) | ORAL | 0 refills | Status: DC

## 2020-11-24 MED ORDER — DULOXETINE HCL 60 MG PO CPEP: 60.0000 mg | ORAL_CAPSULE | Freq: Every day | ORAL | 2 refills | Status: DC

## 2020-11-24 MED ORDER — ARIPIPRAZOLE 5 MG PO TABS: 5.0000 mg | ORAL_TABLET | Freq: Every day | ORAL | 2 refills | Status: DC

## 2020-11-24 NOTE — Progress Notes (Signed)
Virtual Visit via Telephone Note  I connected with [name] on 11/24/20 at  1:00 PM EDT by telephone and verified that I am speaking with the correct person using two identifiers.  Location: Patient: home Provider: home office   I discussed the limitations, risks, security and privacy concerns of performing an evaluation and management service by telephone and the availability of in person appointments. I also discussed with the patient that there may be a patient responsible charge related to this service. The patient expressed understanding and agreed to proceed.      I discussed the assessment and treatment plan with the patient. The patient was provided an opportunity to ask questions and all were answered. The patient agreed with the plan and demonstrated an understanding of the instructions.   The patient was advised to call back or seek an in-person evaluation if the symptoms worsen or if the condition fails to improve as anticipated.  I provided 15 minutes of non-face-to-face time during this encounter.   [name], MD  [facility] MD/PA/NP OP Progress Note  11/24/2020 1:19 PM [name]  MRN:  [id number]  Chief Complaint:  Chief Complaint   Depression; Anxiety; ADD; Follow-up    HPI: This patient is a 55 year old married white female who lives with her husband her daughter and the daughter's 3 children in [place]. She is on disability.   Patient returns for follow-up after 3 months.  She states in general she is doing okay.  However she had a abscess in her left breast that was aspirated yesterday so she is still feeling tired and having a fair amount of pain.  Overall however her mood has been good she denies significant anxiety and she is sleeping well.  She denies any thoughts of self-harm or suicidal ideation.  The Adderall continues to help with her focus Visit Diagnosis:    ICD-10-CM   1. Attention deficit hyperactivity disorder (ADHD),  predominantly hyperactive type  F90.1     2. Moderate episode of recurrent major depressive disorder (HCC)  F33.1       Past Psychiatric History: She was hospitalized twice in her 22s and again in 2006 in 2020 for drug overdose with suicidal intent  Past Medical History:  Past Medical History:  Diagnosis Date   ADHD    Alcohol abuse    Anemia    Anxiety    Asthmatic bronchitis    normal PFT/ seen by pulmonary no evidence of COPD   Atypical chest pain 10/05/2017   Back pain    Bilateral swelling of feet    Chest pain    Chest pain on respiration 03/25/2014   Chronic bronchitis (HCC)    Chronic lower back pain    Chronic respiratory failure ([place]) 10/16/2011   Newly 02 dep 24/7 p discharge from [facility] 01/2013  - 03/13/2014  Walked RA  2 laps @ 185 ft each stopped due to  Sob/ aching in legs, thirsty/ no desat @ slow pace    Chronic respiratory failure with hypoxia (HCC)    On 2-3 L of oxygen at home   Cigarette smoker 12/02/2010   Followed in Pulmonary clinic/ [facility]/ [place]   - Limits of effective care reviewed 8/32/5498     Complication of anesthesia    Constipation    COPD (chronic obstructive pulmonary disease) ([place])    COPD with chronic bronchitis ([place]) 10/01/2017   Daily headache    Depression    Diabetic peripheral neuropathy ([place])  DM (diabetes mellitus) type II controlled, neurological manifestation ([place]) 12/02/2010   Drug use    Gallbladder problem    Gastric erosions    EGD 08/2010.   GERD (gastroesophageal reflux disease)    Glaucoma    H/O drug abuse ([place]) 11/12/2017   -- scanned document from outside source: [facility] in [place] which showed UDS positive for Adderall /amphetamine usage as well as positive urine for THC.  This test result was collected 08/11/2016 and reported 10/07/2016. - lso review of the chart shows another positive amphetamine, THC and METH in the urine back on 10/18/2015 under "care everywhere".  ---So    Heavy menses    High cholesterol    History of blood transfusion    "related to low HgB" (10/05/2017)   History of hiatal hernia    HTN (hypertension)    Hyperlipidemia associated with type 2 diabetes mellitus ([place]) 10/18/2015   Hypertension associated with diabetes ([place]) 12/02/2010   D/c acei 12/22/2011 due to psuedowheeze and narcotic dependent cough> ? Improved - 10/18/9483 started bystolic in place of cozar due to cough     IBS (irritable bowel syndrome)    Increased urinary protein excretion    Internal hemorrhoids    Colonoscopy 5/12.   Iron deficiency anemia 10/01/2017   Joint pain    Mixed diabetic hyperlipidemia associated with type 2 diabetes mellitus ([place]) 10/01/2017   On home oxygen therapy    "5L at night" (10/05/2017)   OSA on CPAP    Osteoarthritis    "back" (10/05/2017)   Oxygen dependent 10/16/2011   Pneumonia    "lots of times" (10/05/2017)   PONV (postoperative nausea and vomiting)    Poorly controlled diabetes mellitus ([place]) 11/12/2017   Pulmonary infiltrates 12/22/2011   Followed in Pulmonary clinic/ [facility]/ [place]    - See CT Chest 05/05/11    Shortness of breath    Tachycardia    never had test done since no insurance   Tobacco use disorder-current smoker greater than 40-pack-year history- since age 73 1 ppd 10/01/2017   Type II diabetes mellitus ([place])    Vitamin D deficiency     Past Surgical History:  Procedure Laterality Date   CESAREAN SECTION  1988; [place]   COLONOSCOPY  09/16/2010   IOE:VOJJKK colon/small internal hemorrhoids   ESOPHAGOGASTRODUODENOSCOPY  09/16/2010   SLF: normal/mild gastritis   ESOPHAGOGASTRODUODENOSCOPY N/A 10/19/2014   Procedure: ESOPHAGOGASTRODUODENOSCOPY (EGD);  Surgeon: Danie Binder, MD;  Location: [facility];  Service: Endoscopy;  Laterality: N/A;  [place]  01/17/2012   Procedure: HYSTEROSCOPY WITH THERMACHOICE;  Surgeon: Florian Buff, MD;  Location: [facility] ORS;  Service: Gynecology;  Laterality: N/A;  total therapy time: 9:13sec  D5W  18 ml in, D5W   73ml out, temperture 87degrees celcious   KIDNEY SURGERY     as child for blockages   LEFT HEART CATH AND CORONARY ANGIOGRAPHY N/A 09/08/2019   Procedure: LEFT HEART CATH AND CORONARY ANGIOGRAPHY;  Surgeon: Martinique, Peter M, MD;  Location: [facility];  Service: Cardiovascular;  Laterality: N/A;   TONSILLECTOMY     TUBAL LIGATION  1989   TYMPANOSTOMY TUBE PLACEMENT Bilateral    "several times when I was a child"   uterine ablation     WRIST FRACTURE SURGERY Left 1995    Family Psychiatric History: see below  Family History:  Family History  Problem Relation Age of Onset   Heart attack Mother 66       deceased   Diabetes Mother    Breast cancer Mother 65       inflammatory breast ca   Heart failure Mother        oxygen dependence, nonsmoker   Heart disease Mother    Depression Mother    Cancer Mother    Hypertension Mother    Hyperlipidemia Mother    Sudden death Mother    Sleep apnea Mother    Obesity Mother    Heart attack Father 28       deceased, etoh use   Heart disease Father    Alcohol abuse Father    Depression Father    Heart failure Father    Sudden death Father    Ulcers Sister    Hypertension Sister    Heart failure Sister    Depression Sister    Anxiety disorder Sister    Liver disease Maternal Aunt 40       died while on liver transplant list   Heart attack Maternal Grandmother        premature CAD   Colon cancer Neg Hx     Social History:  Social History   Socioeconomic History   Marital status: Married    Spouse name: Not on file   Number of children: 2   Years of education: Not on file   Highest education level: Not on file  Occupational History   Occupation: unemployed  Tobacco Use   Smoking status: Former    Packs/day: 1.50    Years: 40.00    Pack years: 60.00    Types: Cigarettes    Start date: 1980    Smokeless tobacco: Current   Tobacco comments:    vape  Scientific laboratory technician Use: Every day  Substance and Sexual Activity   Alcohol use: Not Currently    Comment: once a month - 3-4 mixed drinks   Drug use: Yes    Types: Marijuana    Comment: daily use   Sexual activity: Yes    Partners: Male    Birth control/protection: Surgical  Other Topics Concern   Not on file  Social History Narrative   Not on file   Social Determinants of Health   Financial Resource Strain: Not on file  Food Insecurity: Not on file  Transportation Needs: Not on file  Physical Activity: Not on file  Stress: Not on file  Social Connections: Not on file    Allergies:  Allergies  Allergen Reactions   Codeine Itching and Nausea Only   Metformin And Related Diarrhea and Other (See Comments)    Stomach pain/nausea   Wellbutrin [Bupropion Hcl] Hives   Acyclovir And Related Rash    Metabolic Disorder Labs: Lab Results  Component Value Date   HGBA1C 9.7 (H) 10/20/2020   MPG 188.64 03/05/2019   MPG 160 (H) 03/10/2015   No results found for: PROLACTIN Lab Results  Component Value Date   CHOL 215 (H) 10/20/2020   TRIG 146.0 10/20/2020   HDL 43.10 10/20/2020   CHOLHDL 5 10/20/2020   VLDL 29.2 10/20/2020   LDLCALC 143 (H) 10/20/2020   LDLCALC 85 06/30/2019   Lab Results  Component Value Date   TSH 1.860 06/30/2019   TSH 2.40 11/26/2018    Therapeutic Level Labs: No results found for: LITHIUM No results found for: VALPROATE No components found  for:  CBMZ  Current Medications: Current Outpatient Medications  Medication Sig Dispense Refill   amphetamine-dextroamphetamine (ADDERALL) 30 MG tablet Take 1 tablet by mouth 2 (two) times daily. 60 tablet 0   amphetamine-dextroamphetamine (ADDERALL) 30 MG tablet Take 1 tablet by mouth 2 (two) times daily. 60 tablet 0   ACCU-CHEK GUIDE test strip USE UP TO FOUR TIMES DAILY AS DIRECTED 200 strip 11   Accu-Chek Softclix Lancets lancets USE UP TO  FOUR TIMES DAILY AS DIRECTED 300 each 0   albuterol (VENTOLIN HFA) 108 (90 Base) MCG/ACT inhaler Inhale 2 puffs into the lungs every 6 (six) hours as needed for wheezing or shortness of breath.     amphetamine-dextroamphetamine (ADDERALL) 30 MG tablet Take 1 tablet by mouth 2 (two) times daily. 60 tablet 0   ARIPiprazole (ABILIFY) 5 MG tablet Take 1 tablet (5 mg total) by mouth daily. 30 tablet 2   atorvastatin (LIPITOR) 20 MG tablet Take 1 tablet (20 mg total) by mouth daily. 90 tablet 3   blood glucose meter kit and supplies Dispense based on patient and insurance preference. Use up to four times daily as directed. (FOR ICD-10 E10.9, E11.9). 1 each 0   cephALEXin (KEFLEX) 500 MG capsule Take 1 capsule (500 mg total) by mouth 2 (two) times daily for 7 days. 14 capsule 0   clonazePAM (KLONOPIN) 0.5 MG tablet Take 1 tablet (0.5 mg total) by mouth 2 (two) times daily. 60 tablet 2   dapagliflozin propanediol (FARXIGA) 10 MG TABS tablet Take 1 tablet (10 mg total) by mouth daily. 90 tablet 1   DULoxetine (CYMBALTA) 60 MG capsule Take 1 capsule (60 mg total) by mouth daily. 60 capsule 2   ezetimibe (ZETIA) 10 MG tablet Take 1 tablet (10 mg total) by mouth daily. 90 tablet 3   fluticasone (FLONASE) 50 MCG/ACT nasal spray INSTILL 1 SPRAY INTO EACH NOSTRIL TWICE DAILY AFTER SINUS RINSES. PLEASE SCHEDULE APPOINTMENT FOR FURTHER REFILLS 48 g 2   furosemide (LASIX) 40 MG tablet AS NEEDED for weight increase of 3 lbs overnight or 5 lbs in 1 week 90 tablet 3   ibuprofen (ADVIL) 200 MG tablet Take 200 mg by mouth every 6 (six) hours as needed.     Insulin Pen Needle (BD PEN NEEDLE NANO 2ND GEN) 32G X 4 MM MISC USE DAILY WITH VICTOZA 100 each 11   isosorbide mononitrate (IMDUR) 30 MG 24 hr tablet TAKE 1 TABLET BY MOUTH EVERY DAY 90 tablet 3   Lancets Misc. (ACCU-CHEK SOFTCLIX LANCET DEV) KIT Check Blood sugar up to 4 times a day 1 kit 0   liraglutide (VICTOZA) 18 MG/3ML SOPN Inject 1.8 mg into the skin daily. 27  mL 5   losartan (COZAAR) 25 MG tablet TAKE 1 TABLET(25 MG) BY MOUTH DAILY (Patient taking differently: 12.5 mg.) 90 tablet 1   meloxicam (MOBIC) 15 MG tablet Take 1 tablet (15 mg total) by mouth daily. 90 tablet 0   metoprolol tartrate (LOPRESSOR) 25 MG tablet Take 1 tablet (25 mg total) by mouth daily. 90 tablet 3   Multiple Vitamins-Minerals (MULTIVITAMIN WITH MINERALS) tablet Take 1 tablet by mouth daily. (Patient not taking: Reported on 11/19/2020)     nitroGLYCERIN (NITROSTAT) 0.4 MG SL tablet Place 1 tablet (0.4 mg total) under the tongue every 5 (five) minutes as needed for chest pain. (Patient not taking: Reported on 11/19/2020) 25 tablet 3   nystatin (MYCOSTATIN/NYSTOP) powder Apply 1 application topically 2 (two) times daily. 15 g 3  pantoprazole (PROTONIX) 40 MG tablet Take 1 tablet by mouth daily.     pregabalin (LYRICA) 150 MG capsule TAKE 1 CAPSULE BY MOUTH TWICE DAILY 180 capsule 0   Current Facility-Administered Medications  Medication Dose Route Frequency Provider Last Rate Last Admin   sodium chloride flush (NS) 0.9 % injection 3 mL  3 mL Intravenous Q12H Donato Heinz, MD         Musculoskeletal: Strength & Muscle Tone: within normal limits Gait & Station: normal Patient leans: N/A  Psychiatric Specialty Exam: Review of Systems  Constitutional:  Positive for fatigue.  Musculoskeletal:  Positive for myalgias.  All other systems reviewed and are negative.  Last menstrual period 08/01/2014.There is no height or weight on file to calculate BMI.  General Appearance: NA  Eye Contact:  NA  Speech:  Clear and Coherent  Volume:  Normal  Mood:  Euthymic  Affect:  NA  Thought Process:  Goal Directed  Orientation:  Full (Time, Place, and Person)  Thought Content: WDL   Suicidal Thoughts:  No  Homicidal Thoughts:  No  Memory:  Immediate;   Good Recent;   Good Remote;   Good  Judgement:  Good  Insight:  Good  Psychomotor Activity:  Decreased  Concentration:   Concentration: Good and Attention Span: Good  Recall:  Good  Fund of Knowledge: Good  Language: Good  Akathisia:  No  Handed:  Right  AIMS (if indicated): not done  Assets:  Communication Skills Desire for Improvement Resilience Social Support Talents/Skills  ADL's:  Intact  Cognition: WNL  Sleep:  Good   Screenings: AIMS    Flowsheet Row Admission (Discharged) from 03/07/2019 in [facility] Admission (Discharged) from 09/12/2014 in [facility] Total Score 0 0      AUDIT    Flowsheet Row Admission (Discharged) from 03/07/2019 in [facility] Admission (Discharged) from 09/12/2014 in [facility]  Alcohol Use Disorder Identification Test Final Score (AUDIT) 0 0      GAD-7    Flowsheet Row Office Visit from 10/20/2020 in [facility] Visit from 11/06/2019 in [facility]  Total GAD-7 Score 9 5      PHQ2-9    Flowsheet Row Video Visit from 11/24/2020 in [facility] Office Visit from 10/20/2020 in [facility] Office Visit from 11/06/2019 in [facility] from 10/30/2019 in [facility] Visit from 06/30/2019 in [facility]  PHQ-2 Total Score 1 1 0 0 5  PHQ-9 Total Score -- 10 6 -- 17      Flowsheet Row Video Visit from 11/24/2020 in [facility] Admission (Discharged) from 03/07/2019 in [facility]-Admission (Discharged) from 03/04/2019 in [facility]  C-SSRS RISK CATEGORY No Risk High Risk Error: Q3, 4, or 5 should not be populated when Q2 is No        Assessment and Plan: This patient is a 55 year old female with a history of depression anxiety and ADD.   Despite her recent medical issues she is doing well.  She will continue Cymbalta 60 mg daily for depression, Abilify 5 mg daily for augmentation, clonazepam 0.5 mg twice daily for anxiety or sleep and Adderall 30 mg twice daily for ADD.  She will return to see me in 3 months  [name], MD 11/24/2020, 1:19 PM

## 2020-11-29 ENCOUNTER — Ambulatory Visit
Admission: RE | Admit: 2020-11-29 | Discharge: 2020-11-29 | Disposition: A | Discharge status: Home / Self Care | Payer: Medicare HMO | Source: Ambulatory Visit | Attending: Obstetrics & Gynecology | Admitting: Obstetrics & Gynecology

## 2020-11-29 ENCOUNTER — Other Ambulatory Visit: Payer: Self-pay | Admitting: Obstetrics & Gynecology

## 2020-11-29 ENCOUNTER — Other Ambulatory Visit: Payer: Self-pay

## 2020-11-29 DIAGNOSIS — N611 Abscess of the breast and nipple: Secondary | ICD-10-CM

## 2020-11-29 DIAGNOSIS — N6001 Solitary cyst of right breast: Secondary | ICD-10-CM | POA: Diagnosis not present

## 2020-12-01 ENCOUNTER — Other Ambulatory Visit: Payer: Self-pay

## 2020-12-02 ENCOUNTER — Ambulatory Visit (INDEPENDENT_AMBULATORY_CARE_PROVIDER_SITE_OTHER): Payer: Medicare HMO | Admitting: Family Medicine

## 2020-12-02 ENCOUNTER — Encounter: Payer: Self-pay | Admitting: Family Medicine

## 2020-12-02 VITALS — BP 112/56 | HR 74 | Temp 96.6°F | Ht 64.0 in | Wt 276.2 lb

## 2020-12-02 DIAGNOSIS — E782 Mixed hyperlipidemia: Secondary | ICD-10-CM

## 2020-12-02 DIAGNOSIS — F909 Attention-deficit hyperactivity disorder, unspecified type: Secondary | ICD-10-CM | POA: Diagnosis not present

## 2020-12-02 DIAGNOSIS — E1169 Type 2 diabetes mellitus with other specified complication: Secondary | ICD-10-CM | POA: Diagnosis not present

## 2020-12-02 DIAGNOSIS — I152 Hypertension secondary to endocrine disorders: Secondary | ICD-10-CM | POA: Diagnosis not present

## 2020-12-02 DIAGNOSIS — D72828 Other elevated white blood cell count: Secondary | ICD-10-CM

## 2020-12-02 DIAGNOSIS — E1165 Type 2 diabetes mellitus with hyperglycemia: Secondary | ICD-10-CM | POA: Diagnosis not present

## 2020-12-02 DIAGNOSIS — E1159 Type 2 diabetes mellitus with other circulatory complications: Secondary | ICD-10-CM | POA: Diagnosis not present

## 2020-12-02 DIAGNOSIS — R69 Illness, unspecified: Secondary | ICD-10-CM | POA: Diagnosis not present

## 2020-12-02 MED ORDER — GLIPIZIDE 5 MG PO TABS: 5.0000 mg | ORAL_TABLET | Freq: Two times a day (BID) | ORAL | 3 refills | Status: DC

## 2020-12-02 MED ORDER — ATORVASTATIN CALCIUM 40 MG PO TABS: 20.0000 mg | ORAL_TABLET | Freq: Every day | ORAL | 6 refills | Status: DC

## 2020-12-02 MED ORDER — SITAGLIPTIN PHOSPHATE 100 MG PO TABS: 100.0000 mg | ORAL_TABLET | Freq: Every day | ORAL | 5 refills | Status: DC

## 2020-12-02 MED ORDER — AMPHETAMINE-DEXTROAMPHETAMINE 30 MG PO TABS: 30.0000 mg | ORAL_TABLET | Freq: Two times a day (BID) | ORAL | 0 refills | Status: DC

## 2020-12-02 NOTE — Telephone Encounter (Signed)
Dr.Lavoie replied "Please send a prescription for Keflex 500 mg/tab 1 tab PO TID for 7 days.  Note that I increased to TID given that the Dx of a Breast Abscess was confirmed.

## 2020-12-02 NOTE — Progress Notes (Signed)
[facility] [address] [place] [address] Dept: [phone] Dept Fax: [phone]  Office Visit  Subjective:    Patient ID: [name], female    DOB: 12/24/1965, 55 y.o..   MRN: [id number]  Chief Complaint  Patient presents with   Follow-up    F/u lab work results    History of Present Illness:  Patient is in today for review of her recent lab work. [name] has been a patient of Dr. Barron Alvine, who is no longer practicing medicine. She ahd come for lab work in late June and understands her HbA1c was elevated. She notes she is struggling with stress and stress eating currently. Her husband was recently diagnosed with cancer. She does see a psychiatrist and a therapist. She is treated for ADHD, and finds her eating is not excessive during the day, but notes increased eating at nights when this wears off.  [name] has Type 2 diabetes. She was previously on metformin, but did not tolerate this due to GI upset. She is currently managed on liraglutide and dapagliflozin. [name] also has a history of hyperlipidemia. She notes that she had trouble with GI upset with statins in the past. She is currently on atorvastatin 20 mg daily, and notes she is tolerating this.[name] does complain of occasional episodes of lightheadedness. She is on metoprolol, Lasix, and losartan for blood pressure and heart rate control. She only occasionally takes the Lasix.  Past Medical History: Patient Active Problem List   Diagnosis Date Noted   Abnormal CXR 10/18/2019   COPD  GOLD 0 / CB features/ still smoking 10/17/2019   Angina pectoris (HCC) 09/08/2019   MDD (major depressive disorder), severe (HCC) 03/07/2019   Benzodiazepine overdose 03/05/2019   Generalized muscle ache 09/24/2018   Personal history of noncompliance with medical treatment, presenting hazards to health 09/24/2018   Diabetes mellitus (HCC) 03/11/2018    Chronic right ear pain 03/11/2018   Chronic sinusitis 03/11/2018   H/O drug abuse (HCC) 11/12/2017   Positive for macroalbuminuria 11/12/2017   h/o freq Vaginal yeast infections 11/12/2017   Poorly controlled diabetes mellitus (HCC) 11/12/2017   Atypical chest pain 10/05/2017   COPD with chronic bronchitis (HCC) 10/01/2017   Mixed diabetic hyperlipidemia associated with type 2 diabetes mellitus (HCC) 10/01/2017   Tobacco use disorder-current smoker greater than 40-pack-year history- since age 39 1 ppd 10/01/2017   Tobacco abuse counseling 10/01/2017   Diabetic peripheral neuropathy (HCC) 10/01/2017   Iron deficiency anemia 10/01/2017   Adult attention deficit hyperactivity disorder 01/14/2016   Gastric reflux 10/18/2015   Major depressive disorder, recurrent severe without psychotic features (HCC)    Chest pain on respiration 03/25/2014   Leukocytosis 03/25/2014   Upper airway cough syndrome 02/28/2014   Abnormal drug screen 04/11/2013   OSA on CPAP 04/11/2013   Perforated ear drum 05/22/2012   Bell's palsy 01/23/2012   Carpal tunnel syndrome 01/23/2012   Carpal tunnel syndrome on both sides 01/11/2012   Chronic respiratory failure (HCC) 10/16/2011   Oxygen dependent 10/16/2011   Peripheral neuropathy 07/07/2011   Vitamin D deficiency 07/06/2011   Asthmatic bronchitis 06/11/2011   Back pain 06/11/2011   Morbid obesity (HCC) 05/06/2011   Depression 05/04/2011   Gastric erosions 12/05/2010   Hypertension associated with diabetes (HCC) 12/02/2010   Diabetes mellitus, type II (HCC) 12/02/2010   Cigarette smoker 12/02/2010   COPD with acute exacerbation (HCC) 11/30/2010   GERD (gastroesophageal reflux disease) 09/08/2010  Esophageal dysphagia 09/08/2010   Past Surgical History:  Procedure Laterality Date   CESAREAN SECTION  1988; [place]   COLONOSCOPY  09/16/2010   HOZ:[id number] colon/small internal hemorrhoids   ESOPHAGOGASTRODUODENOSCOPY  09/16/2010    SLF: normal/mild gastritis   ESOPHAGOGASTRODUODENOSCOPY N/A 10/19/2014   Procedure: ESOPHAGOGASTRODUODENOSCOPY (EGD);  Surgeon: Danie Binder, MD;  Location: [facility];  Service: Endoscopy;  Laterality: N/A;  [place]  01/17/2012   Procedure: HYSTEROSCOPY WITH THERMACHOICE;  Surgeon: Florian Buff, MD;  Location: [facility] ORS;  Service: Gynecology;  Laterality: N/A;  total therapy time: 9:13sec  D5W  18 ml in, D5W   22ml out, temperture 87degrees celcious   KIDNEY SURGERY     as child for blockages   LEFT HEART CATH AND CORONARY ANGIOGRAPHY N/A 09/08/2019   Procedure: LEFT HEART CATH AND CORONARY ANGIOGRAPHY;  Surgeon: Martinique, Peter M, MD;  Location: [facility];  Service: Cardiovascular;  Laterality: N/A;   TONSILLECTOMY     TUBAL LIGATION  1989   TYMPANOSTOMY TUBE PLACEMENT Bilateral    "several times when I was a child"   uterine ablation     WRIST FRACTURE SURGERY Left 1995   Family History  Problem Relation Age of Onset   Heart attack Mother 50       deceased   Diabetes Mother    Breast cancer Mother 31       inflammatory breast ca   Heart failure Mother        oxygen dependence, nonsmoker   Heart disease Mother    Depression Mother    Cancer Mother    Hypertension Mother    Hyperlipidemia Mother    Sudden death Mother    Sleep apnea Mother    Obesity Mother    Heart attack Father 26       deceased, etoh use   Heart disease Father    Alcohol abuse Father    Depression Father    Heart failure Father    Sudden death Father    Ulcers Sister    Hypertension Sister    Heart failure Sister    Depression Sister    Anxiety disorder Sister    Liver disease Maternal Aunt 40       died while on liver transplant list   Heart attack Maternal Grandmother        premature CAD   Colon cancer Neg Hx    Outpatient Medications Prior to Visit  Medication Sig Dispense Refill   ACCU-CHEK GUIDE test strip USE UP TO FOUR TIMES  DAILY AS DIRECTED 200 strip 11   Accu-Chek Softclix Lancets lancets USE UP TO FOUR TIMES DAILY AS DIRECTED 300 each 0   albuterol (VENTOLIN HFA) 108 (90 Base) MCG/ACT inhaler Inhale 2 puffs into the lungs every 6 (six) hours as needed for wheezing or shortness of breath.     ARIPiprazole (ABILIFY) 5 MG tablet Take 1 tablet (5 mg total) by mouth daily. 30 tablet 2   blood glucose meter kit and supplies Dispense based on patient and insurance preference. Use up to four times daily as directed. (FOR ICD-10 E10.9, E11.9). 1 each 0   clonazePAM (KLONOPIN) 0.5 MG tablet Take 1 tablet (0.5 mg total) by mouth 2 (two) times daily. 60 tablet 2   dapagliflozin propanediol (FARXIGA) 10 MG TABS tablet Take 1 tablet (10 mg total) by mouth daily. 90 tablet  1   DULoxetine (CYMBALTA) 60 MG capsule Take 1 capsule (60 mg total) by mouth daily. 60 capsule 2   fluticasone (FLONASE) 50 MCG/ACT nasal spray INSTILL 1 SPRAY INTO EACH NOSTRIL TWICE DAILY AFTER SINUS RINSES. PLEASE SCHEDULE APPOINTMENT FOR FURTHER REFILLS 48 g 2   furosemide (LASIX) 40 MG tablet AS NEEDED for weight increase of 3 lbs overnight or 5 lbs in 1 week 90 tablet 3   ibuprofen (ADVIL) 200 MG tablet Take 200 mg by mouth every 6 (six) hours as needed.     Insulin Pen Needle (BD PEN NEEDLE NANO 2ND GEN) 32G X 4 MM MISC USE DAILY WITH VICTOZA 100 each 11   isosorbide mononitrate (IMDUR) 30 MG 24 hr tablet TAKE 1 TABLET BY MOUTH EVERY DAY 90 tablet 3   Lancets Misc. (ACCU-CHEK SOFTCLIX LANCET DEV) KIT Check Blood sugar up to 4 times a day 1 kit 0   liraglutide (VICTOZA) 18 MG/3ML SOPN Inject 1.8 mg into the skin daily. 27 mL 5   losartan (COZAAR) 25 MG tablet TAKE 1 TABLET(25 MG) BY MOUTH DAILY (Patient taking differently: 12.5 mg.) 90 tablet 1   meloxicam (MOBIC) 15 MG tablet Take 1 tablet (15 mg total) by mouth daily. 90 tablet 0   metoprolol tartrate (LOPRESSOR) 25 MG tablet Take 1 tablet (25 mg total) by mouth daily. 90 tablet 3   nystatin  (MYCOSTATIN/NYSTOP) powder Apply 1 application topically 2 (two) times daily. 15 g 3   pantoprazole (PROTONIX) 40 MG tablet Take 1 tablet by mouth daily.     pregabalin (LYRICA) 150 MG capsule TAKE 1 CAPSULE BY MOUTH TWICE DAILY 180 capsule 0   amphetamine-dextroamphetamine (ADDERALL) 30 MG tablet Take 1 tablet by mouth 2 (two) times daily. 60 tablet 0   amphetamine-dextroamphetamine (ADDERALL) 30 MG tablet Take 1 tablet by mouth 2 (two) times daily. 60 tablet 0   amphetamine-dextroamphetamine (ADDERALL) 30 MG tablet Take 1 tablet by mouth 2 (two) times daily. 60 tablet 0   atorvastatin (LIPITOR) 20 MG tablet Take 1 tablet (20 mg total) by mouth daily. 90 tablet 3   ezetimibe (ZETIA) 10 MG tablet Take 1 tablet (10 mg total) by mouth daily. 90 tablet 3   Multiple Vitamins-Minerals (MULTIVITAMIN WITH MINERALS) tablet Take 1 tablet by mouth daily. (Patient not taking: Reported on 11/19/2020)     nitroGLYCERIN (NITROSTAT) 0.4 MG SL tablet Place 1 tablet (0.4 mg total) under the tongue every 5 (five) minutes as needed for chest pain. (Patient not taking: Reported on 11/19/2020) 25 tablet 3   sodium chloride flush (NS) 0.9 % injection 3 mL      No facility-administered medications prior to visit.   Allergies  Allergen Reactions   Codeine Itching and Nausea Only   Metformin And Related Diarrhea and Other (See Comments)    Stomach pain/nausea   Wellbutrin [Bupropion Hcl] Hives   Acyclovir And Related Rash    Objective:   Today's Vitals   12/02/20 0856  BP: (!) 112/56  Pulse: 74  Temp: (!) 96.6 F (35.9 C)  TempSrc: Temporal  SpO2: 97%  Weight: 276 lb 3.2 oz (125.3 kg)  Height: $Remove'5\' 4"'YmrGWBT$  (1.626 m)   Body mass index is 47.41 kg/m.   General: Well developed, well nourished. No acute distress. Psych: Alert and oriented. Normal mood and affect.  Health Maintenance Due  Topic Date Due   Hepatitis C Screening  Never done   Zoster Vaccines- Shingrix (1 of 2) Never done   Pneumococcal Vaccine  96-78 Years old (2 - PCV) 02/19/2013   FOOT EXAM  01/11/2019   COVID-19 Vaccine (2 - Pfizer risk series) 11/07/2019   OPHTHALMOLOGY EXAM  07/03/2020   COLONOSCOPY (Pts 45-10yrs Insurance coverage will need to be confirmed)  08/22/2020   INFLUENZA VACCINE  11/22/2020   Lab Results Lab Results  Component Value Date   HGBA1C 9.7 (H) 10/20/2020   Lab Results  Component Value Date   CHOL 215 (H) 10/20/2020   HDL 43.10 10/20/2020   LDLCALC 143 (H) 10/20/2020   LDLDIRECT 121 (H) 10/03/2013   TRIG 146.0 10/20/2020   CHOLHDL 5 10/20/2020   CMP Latest Ref Rng & Units 10/20/2020 10/29/2019 10/09/2019  Glucose 70 - 99 mg/dL 154(H) 161(H) 190(H)  BUN 6 - 23 mg/dL $Remove'15 11 11  'YnBMDLy$ Creatinine 0.40 - 1.20 mg/dL 0.76 0.82 0.73  Sodium 135 - 145 mEq/L 136 139 140  Potassium 3.5 - 5.1 mEq/L 4.1 4.1 4.6  Chloride 96 - 112 mEq/L 97 96 97  CO2 19 - 32 mEq/L $Remove'27 29 27  'hzeZGVv$ Calcium 8.4 - 10.5 mg/dL 9.2 9.6 9.6  Total Protein 6.0 - 8.3 g/dL 7.2 - -  Total Bilirubin 0.2 - 1.2 mg/dL 0.5 - -  Alkaline Phos 39 - 117 U/L 65 - -  AST 0 - 37 U/L 15 - -  ALT 0 - 35 U/L 15 - -   Lab Results  Component Value Date   WBC 15.6 (H) 10/20/2020   HGB 14.1 10/20/2020   HCT 43.8 10/20/2020   MCV 83.0 10/20/2020   PLT 257.0 10/20/2020     Assessment & Plan:  1. Poorly controlled diabetes mellitus ([place]) Will add glipizide to her current regimen of Victrova and Farxiga. I will reassess her HbA1c at her next visit for TOC.  - glipiZIDE (GLUCOTROL) 5 MG tablet; Take 1 tablet (5 mg total) by mouth 2 (two) times daily before a meal.  Dispense: 60 tablet; Refill: 3  2. Hypertension associated with diabetes ([employer]) Blood pressure is a little low today. We didi discuss that hypotension can be a side effect of her [place]. I encouraged her to push fluids.  3. Mixed diabetic hyperlipidemia associated with type 2 diabetes mellitus ([place]) Lipids remain above goal. We will try to increase her Lipitor dose,a s she is currently tolerating  the 20 mg dose.  - atorvastatin (LIPITOR) 40 MG tablet; Take 0.5 tablets (20 mg total) by mouth daily.  Dispense: 30 tablet; Refill: 6  4. Adult attention deficit hyperactivity disorder Suspect her binge eating at night is in part due to her ADHD. She should continue to work with psychiatry related to management of her ADHD. We did discuss the impact of stress and depression on such matters.  - amphetamine-dextroamphetamine (ADDERALL) 30 MG tablet; Take 1 tablet by mouth 2 (two) times daily.  Dispense: 60 tablet; Refill: 0  5. Other elevated white blood cell (WBC) count Ms. Glick has a persistent elevated leukocyte count. I will refer her to hematology for evaluation of this.  - Ambulatory referral to Hematology / Oncology  Haydee Salter, MD

## 2020-12-03 ENCOUNTER — Telehealth: Payer: Self-pay | Admitting: Nurse Practitioner

## 2020-12-03 MED ORDER — FLUCONAZOLE 150 MG PO TABS: 150.0000 mg | ORAL_TABLET | Freq: Once | ORAL | 0 refills | Status: AC

## 2020-12-03 MED ORDER — CEPHALEXIN 500 MG PO CAPS: 500.0000 mg | ORAL_CAPSULE | Freq: Three times a day (TID) | ORAL | 0 refills | Status: DC

## 2020-12-03 NOTE — Telephone Encounter (Signed)
Received a new hem referral from Dr. Gena Fray for elevated wbc. Pt returned my call and has been scheduled to see Lacie on 8/18 at 11am.

## 2020-12-03 NOTE — Telephone Encounter (Signed)
Dr.Lavoie patient is requesting diflucan tablet for yeast.

## 2020-12-07 ENCOUNTER — Other Ambulatory Visit: Payer: Medicare HMO

## 2020-12-09 ENCOUNTER — Inpatient Hospital Stay: Payer: Medicare HMO | Attending: Nurse Practitioner | Admitting: Nurse Practitioner

## 2020-12-09 ENCOUNTER — Other Ambulatory Visit: Payer: Self-pay

## 2020-12-09 ENCOUNTER — Inpatient Hospital Stay: Payer: Medicare HMO

## 2020-12-09 VITALS — BP 114/57 | HR 94 | Temp 98.7°F | Resp 18 | Ht 64.0 in | Wt 273.2 lb

## 2020-12-09 DIAGNOSIS — J449 Chronic obstructive pulmonary disease, unspecified: Secondary | ICD-10-CM | POA: Diagnosis not present

## 2020-12-09 DIAGNOSIS — G4733 Obstructive sleep apnea (adult) (pediatric): Secondary | ICD-10-CM | POA: Diagnosis not present

## 2020-12-09 DIAGNOSIS — I10 Essential (primary) hypertension: Secondary | ICD-10-CM | POA: Insufficient documentation

## 2020-12-09 DIAGNOSIS — D72829 Elevated white blood cell count, unspecified: Secondary | ICD-10-CM

## 2020-12-09 DIAGNOSIS — F17210 Nicotine dependence, cigarettes, uncomplicated: Secondary | ICD-10-CM | POA: Insufficient documentation

## 2020-12-09 DIAGNOSIS — E1159 Type 2 diabetes mellitus with other circulatory complications: Secondary | ICD-10-CM | POA: Insufficient documentation

## 2020-12-09 DIAGNOSIS — F419 Anxiety disorder, unspecified: Secondary | ICD-10-CM | POA: Diagnosis not present

## 2020-12-09 DIAGNOSIS — Z803 Family history of malignant neoplasm of breast: Secondary | ICD-10-CM | POA: Diagnosis not present

## 2020-12-09 DIAGNOSIS — D71 Functional disorders of polymorphonuclear neutrophils: Secondary | ICD-10-CM | POA: Diagnosis not present

## 2020-12-09 DIAGNOSIS — R69 Illness, unspecified: Secondary | ICD-10-CM | POA: Diagnosis not present

## 2020-12-09 DIAGNOSIS — K2970 Gastritis, unspecified, without bleeding: Secondary | ICD-10-CM | POA: Insufficient documentation

## 2020-12-09 LAB — CBC WITH DIFFERENTIAL (CANCER CENTER ONLY)
Abs Immature Granulocytes: 0.04 10*3/uL (ref 0.00–0.07)
Basophils Absolute: 0.1 10*3/uL (ref 0.0–0.1)
Basophils Relative: 0 %
Eosinophils Absolute: 0.1 10*3/uL (ref 0.0–0.5)
Eosinophils Relative: 1 %
HCT: 44.3 % (ref 36.0–46.0)
Hemoglobin: 13.8 g/dL (ref 12.0–15.0)
Immature Granulocytes: 0 %
Lymphocytes Relative: 27 %
Lymphs Abs: 3.5 10*3/uL (ref 0.7–4.0)
MCH: 26.3 pg (ref 26.0–34.0)
MCHC: 31.2 g/dL (ref 30.0–36.0)
MCV: 84.5 fL (ref 80.0–100.0)
Monocytes Absolute: 0.7 10*3/uL (ref 0.1–1.0)
Monocytes Relative: 5 %
Neutro Abs: 8.7 10*3/uL — ABNORMAL HIGH (ref 1.7–7.7)
Neutrophils Relative %: 67 %
Platelet Count: 223 10*3/uL (ref 150–400)
RBC: 5.24 MIL/uL — ABNORMAL HIGH (ref 3.87–5.11)
RDW: 16 % — ABNORMAL HIGH (ref 11.5–15.5)
WBC Count: 13.1 10*3/uL — ABNORMAL HIGH (ref 4.0–10.5)
nRBC: 0 % (ref 0.0–0.2)

## 2020-12-09 NOTE — Progress Notes (Addendum)
[name]   [phone] [phone]   Clinic New consult Note   Patient Care Team: Haydee Salter, MD as PCP - General (Family Medicine) Belva Crome, MD as PCP - Cardiology (Cardiology) Danie Binder, MD (Inactive) (Gastroenterology) Cloria Spring, MD as Consulting Physician [facility]) Date of Service: 12/09/2020  CHIEF COMPLAINTS/PURPOSE OF CONSULTATION:  Leukocytosis, referred by PCP Dr. Arlester Marker  HISTORY OF PRESENTING ILLNESS:  [name] 55 y.o. female with PMH including HTN, COPD, asthmatic bronchitis, chronic sinusitis, OSA, GERD, DM with neuropathy, h/o Bell's Palsy, obesity, depression, adult ADHD, vitamin D deficiency, iron deficiency anemia, gastritis, and tobacco/drug use is here because of persistent elevated WBC. She was found to have abnormal CBC from 10/28/2007 when Epic records become available with WBC 12.4, Hgb 10.0, and normal plt count. WBC increased to 22.7 on 12/03/10 when patient believes she was hospitalized for pneumonia. CBC with differential on 01/11/12 showed WBC 12.5 with elevated absolute neutrophils to 8.2. she was found to have iron deficiency per 12/31/12 labs, serum iron 25, 7% saturation.  Patient attributes to menorrhagia and EGD 09/2014 shows gastritis.  She tried oral iron but did not tolerate it well and received infusions by Dr. Whitney Muse. IDA resolved in 2020 but she has had persistent leukocytosis WBC 11- 21 with ANC up to 17.4 02/2019 when patient was hospitalized for suicide attempt (benzo overdose). On 06/30/19 differential also showed elevated abs lymphocytes to 3.2. most recent CBC on 10/20/20 shows WBC 15.6.   Socially, she is married lives with her spouse who is currently under treatment for head and neck cancer (Drs. Isidore Moos and [place]).  She is disabled due to her mental and physical health.  Independent with ADLs.  She has not had alcohol in 1 year, she is a current tobacco smoker previously  2 packs/day, now half pack plus vaping.  She smokes marijuana most days per week when she can afford it.  She has a son and a daughter who also has elevated white count secondary to hydradenitis suppurativa.  Family history is positive for mother with breast cancer diagnosed age 75 and a paternal aunt with breast cancer.  Patient is up-to-date on age-appropriate cancer screenings.  Today, she presents by herself. She is stress eating. Husband recently diagnosed with H/N cancer and he is not working. She has COPD, stable lately without recent exacerbation, steroids, or recurrent/chronic infections. She is taking antibiotics for left breast abscess, she has had a few abscess in the past. She has h/o gastritis and diarrhea. She has arthritis. Mood is controlled.   Otherwise denies bleeding, allergies h/o autoimmune disorder. Denies fatigue, fever, night sweats, unintentional weight loss, pain.   MEDICAL HISTORY:  Past Medical History:  Diagnosis Date   ADHD    Alcohol abuse    Anemia    Anxiety    Asthmatic bronchitis    normal PFT/ seen by pulmonary no evidence of COPD   Atypical chest pain 10/05/2017   Back pain    Bilateral swelling of feet    Chest pain    Chest pain on respiration 03/25/2014   Chronic bronchitis (HCC)    Chronic lower back pain    Chronic respiratory failure ([place]) 10/16/2011   Newly 02 dep 24/7 p discharge from [facility] 01/2013  - 03/13/2014  Walked RA  2 laps @ 185 ft each stopped due to  Sob/ aching in legs, thirsty/ no desat @ slow pace    Chronic respiratory  failure with hypoxia (HCC)    On 2-3 L of oxygen at home   Cigarette smoker 12/02/2010   Followed in Pulmonary clinic/ [facility]/ [place]   - Limits of effective care reviewed 7/41/2878     Complication of anesthesia    Constipation    COPD (chronic obstructive pulmonary disease) (HCC)    COPD with chronic bronchitis ([place]) 10/01/2017   Daily headache    Depression    Diabetic peripheral neuropathy ([place])     DM (diabetes mellitus) type II controlled, neurological manifestation ([place]) 12/02/2010   Drug use    Gallbladder problem    Gastric erosions    EGD 08/2010.   GERD (gastroesophageal reflux disease)    Glaucoma    H/O drug abuse ([place]) 11/12/2017   -- scanned document from outside source: [facility] in [place] which showed UDS positive for Adderall /amphetamine usage as well as positive urine for THC.  This test result was collected 08/11/2016 and reported 10/07/2016. - lso review of the chart shows another positive amphetamine, THC and METH in the urine back on 10/18/2015 under "care everywhere". ---So    Heavy menses    High cholesterol    History of blood transfusion    "related to low HgB" (10/05/2017)   History of hiatal hernia    HTN (hypertension)    Hyperlipidemia associated with type 2 diabetes mellitus ([place]) 10/18/2015   Hypertension associated with diabetes ([place]) 12/02/2010   D/c acei 12/22/2011 due to psuedowheeze and narcotic dependent cough> ? Improved - 67/67/2094 started bystolic in place of cozar due to cough     IBS (irritable bowel syndrome)    Increased urinary protein excretion    Internal hemorrhoids    Colonoscopy 5/12.   Iron deficiency anemia 10/01/2017   Joint pain    Mixed diabetic hyperlipidemia associated with type 2 diabetes mellitus ([place]) 10/01/2017   On home oxygen therapy    "5L at night" (10/05/2017)   OSA on CPAP    Osteoarthritis    "back" (10/05/2017)   Oxygen dependent 10/16/2011   Pneumonia    "lots of times" (10/05/2017)   PONV (postoperative nausea and vomiting)    Poorly controlled diabetes mellitus ([place]) 11/12/2017   Pulmonary infiltrates 12/22/2011   Followed in Pulmonary clinic/ [facility]/ [place]    - See CT Chest 05/05/11    Shortness of breath    Tachycardia    never had test done since no insurance   Tobacco use disorder-current smoker greater than 40-pack-year history- since age 6 1 ppd 10/01/2017    Type II diabetes mellitus ([place])    Vitamin D deficiency     SURGICAL HISTORY: Past Surgical History:  Procedure Laterality Date   CESAREAN SECTION  1988; [place]   COLONOSCOPY  09/16/2010   BSJ:GGEZMO colon/small internal hemorrhoids   ESOPHAGOGASTRODUODENOSCOPY  09/16/2010   SLF: normal/mild gastritis   ESOPHAGOGASTRODUODENOSCOPY N/A 10/19/2014   Procedure: ESOPHAGOGASTRODUODENOSCOPY (EGD);  Surgeon: Danie Binder, MD;  Location: [facility];  Service: Endoscopy;  Laterality: N/A;  [place]  01/17/2012   Procedure: HYSTEROSCOPY WITH THERMACHOICE;  Surgeon: Florian Buff, MD;  Location: [facility] ORS;  Service: Gynecology;  Laterality: N/A;  total therapy time: 9:13sec  D5W  18 ml in, D5W   6ml out, temperture 87degrees celcious   KIDNEY SURGERY     as child for blockages  LEFT HEART CATH AND CORONARY ANGIOGRAPHY N/A 09/08/2019   Procedure: LEFT HEART CATH AND CORONARY ANGIOGRAPHY;  Surgeon: Martinique, Peter M, MD;  Location: [facility];  Service: Cardiovascular;  Laterality: N/A;   TONSILLECTOMY     TUBAL LIGATION  1989   TYMPANOSTOMY TUBE PLACEMENT Bilateral    "several times when I was a child"   uterine ablation     WRIST FRACTURE SURGERY Left 1995    SOCIAL HISTORY: Social History   Socioeconomic History   Marital status: Married    Spouse name: Not on file   Number of children: 2   Years of education: Not on file   Highest education level: Not on file  Occupational History   Occupation: unemployed  Tobacco Use   Smoking status: Former    Packs/day: 1.50    Years: 40.00    Pack years: 60.00    Types: Cigarettes    Start date: 1980   Smokeless tobacco: Current   Tobacco comments:    vape  Scientific laboratory technician Use: Every day  Substance and Sexual Activity   Alcohol use: Not Currently    Comment: once a month - 3-4 mixed drinks   Drug use: Yes    Types: Marijuana    Comment: daily use    Sexual activity: Yes    Partners: Male    Birth control/protection: Surgical  Other Topics Concern   Not on file  Social History Narrative   Not on file   Social Determinants of Health   Financial Resource Strain: Not on file  Food Insecurity: Not on file  Transportation Needs: Not on file  Physical Activity: Not on file  Stress: Not on file  Social Connections: Not on file  Intimate Partner Violence: Not on file    FAMILY HISTORY: Family History  Problem Relation Age of Onset   Heart attack Mother 80       deceased   Diabetes Mother    Breast cancer Mother 99       inflammatory breast ca   Heart failure Mother        oxygen dependence, nonsmoker   Heart disease Mother    Depression Mother    Cancer Mother    Hypertension Mother    Hyperlipidemia Mother    Sudden death Mother    Sleep apnea Mother    Obesity Mother    Heart attack Father 67       deceased, etoh use   Heart disease Father    Alcohol abuse Father    Depression Father    Heart failure Father    Sudden death Father    Ulcers Sister    Hypertension Sister    Heart failure Sister    Depression Sister    Anxiety disorder Sister    Liver disease Maternal Aunt 38       died while on liver transplant list   Heart attack Maternal Grandmother        premature CAD   Colon cancer Neg Hx     ALLERGIES:  is allergic to codeine, metformin and related, wellbutrin [bupropion hcl], and acyclovir and related.  MEDICATIONS:  Current Outpatient Medications  Medication Sig Dispense Refill   ACCU-CHEK GUIDE test strip USE UP TO FOUR TIMES DAILY AS DIRECTED 200 strip 11   Accu-Chek Softclix Lancets lancets USE UP TO FOUR TIMES DAILY AS DIRECTED 300 each 0   albuterol (VENTOLIN HFA) 108 (90 Base) MCG/ACT inhaler Inhale 2  puffs into the lungs every 6 (six) hours as needed for wheezing or shortness of breath.     amphetamine-dextroamphetamine (ADDERALL) 30 MG tablet Take 1 tablet by mouth 2 (two) times daily. 60  tablet 0   ARIPiprazole (ABILIFY) 5 MG tablet Take 1 tablet (5 mg total) by mouth daily. 30 tablet 2   atorvastatin (LIPITOR) 40 MG tablet Take 0.5 tablets (20 mg total) by mouth daily. 30 tablet 6   blood glucose meter kit and supplies Dispense based on patient and insurance preference. Use up to four times daily as directed. (FOR ICD-10 E10.9, E11.9). 1 each 0   cephALEXin (KEFLEX) 500 MG capsule Take 1 capsule (500 mg total) by mouth 3 (three) times daily. 21 capsule 0   clonazePAM (KLONOPIN) 0.5 MG tablet Take 1 tablet (0.5 mg total) by mouth 2 (two) times daily. 60 tablet 2   dapagliflozin propanediol (FARXIGA) 10 MG TABS tablet Take 1 tablet (10 mg total) by mouth daily. 90 tablet 1   DULoxetine (CYMBALTA) 60 MG capsule Take 1 capsule (60 mg total) by mouth daily. 60 capsule 2   fluticasone (FLONASE) 50 MCG/ACT nasal spray INSTILL 1 SPRAY INTO EACH NOSTRIL TWICE DAILY AFTER SINUS RINSES. PLEASE SCHEDULE APPOINTMENT FOR FURTHER REFILLS 48 g 2   furosemide (LASIX) 40 MG tablet AS NEEDED for weight increase of 3 lbs overnight or 5 lbs in 1 week 90 tablet 3   glipiZIDE (GLUCOTROL) 5 MG tablet Take 1 tablet (5 mg total) by mouth 2 (two) times daily before a meal. 60 tablet 3   ibuprofen (ADVIL) 200 MG tablet Take 200 mg by mouth every 6 (six) hours as needed.     Insulin Pen Needle (BD PEN NEEDLE NANO 2ND GEN) 32G X 4 MM MISC USE DAILY WITH VICTOZA 100 each 11   isosorbide mononitrate (IMDUR) 30 MG 24 hr tablet TAKE 1 TABLET BY MOUTH EVERY DAY 90 tablet 3   Lancets Misc. (ACCU-CHEK SOFTCLIX LANCET DEV) KIT Check Blood sugar up to 4 times a day 1 kit 0   liraglutide (VICTOZA) 18 MG/3ML SOPN Inject 1.8 mg into the skin daily. 27 mL 5   losartan (COZAAR) 25 MG tablet TAKE 1 TABLET(25 MG) BY MOUTH DAILY (Patient taking differently: 12.5 mg.) 90 tablet 1   meloxicam (MOBIC) 15 MG tablet Take 1 tablet (15 mg total) by mouth daily. 90 tablet 0   metoprolol tartrate (LOPRESSOR) 25 MG tablet Take 1 tablet  (25 mg total) by mouth daily. 90 tablet 3   nystatin (MYCOSTATIN/NYSTOP) powder Apply 1 application topically 2 (two) times daily. 15 g 3   pantoprazole (PROTONIX) 40 MG tablet Take 1 tablet by mouth daily.     pregabalin (LYRICA) 150 MG capsule TAKE 1 CAPSULE BY MOUTH TWICE DAILY 180 capsule 0   No current facility-administered medications for this visit.    REVIEW OF SYSTEMS:   Constitutional: Denies fevers, chills, abnormal night sweats, or unintentional weight loss Eyes: Denies blurriness of vision, double vision or watery eyes Ears, nose, mouth, throat, and face: Denies mucositis or sore throat Respiratory: Denies cough, dyspnea or wheezes (+) COPD Cardiovascular: Denies palpitation, chest discomfort or lower extremity swelling Gastrointestinal:  Denies nausea, vomiting, constipation heartburn or change in bowel habits (+) diarrhea (+) h/o gastritis  Skin: Denies abnormal skin rashes Lymphatics: Denies new lymphadenopathy or easy bruising Neurological:Denies numbness, tingling or new weaknesses Behavioral/Psych: Mood is stable, no new changes (+) depression All other systems were reviewed with the patient and are  negative.  PHYSICAL EXAMINATION: ECOG PERFORMANCE STATUS: 0 - Asymptomatic  Vitals:   12/09/20 1114  BP: (!) 114/57  Pulse: 94  Resp: 18  Temp: 98.7 F (37.1 C)  SpO2: 96%   Filed Weights   12/09/20 1114  Weight: 273 lb 3.2 oz (123.9 kg)    GENERAL:alert, no distress and comfortable SKIN: facial flushing, no pruritis or rash  EYES: sclera clear NECK: without mass LYMPH:  no palpable cervical or supraclavicular lymphadenopathy  LUNGS: coarse breath sounds, normal breathing effort HEART: regular rate & rhythm, no lower extremity edema ABDOMEN:abdomen soft, non-tender and normal bowel sounds. No palpable hepatosplenomegaly  PSYCH: alert & oriented x 3 with fluent speech NEURO: no focal motor/sensory deficits  LABORATORY DATA:  I have reviewed the data as  listed CBC Latest Ref Rng & Units 12/09/2020 10/20/2020 09/03/2019  WBC 4.0 - 10.5 K/uL 13.1(H) 15.6(H) 14.2(H)  Hemoglobin 12.0 - 15.0 g/dL 13.8 14.1 13.8  Hematocrit 36.0 - 46.0 % 44.3 43.8 42.4  Platelets 150 - 400 K/uL 223 257.0 284    CMP Latest Ref Rng & Units 10/20/2020 10/29/2019 10/09/2019  Glucose 70 - 99 mg/dL 154(H) 161(H) 190(H)  BUN 6 - 23 mg/dL $Remove'15 11 11  '[id number]$ Creatinine 0.40 - 1.20 mg/dL 0.76 0.82 0.73  Sodium 135 - 145 mEq/L 136 139 140  Potassium 3.5 - 5.1 mEq/L 4.1 4.1 4.6  Chloride 96 - 112 mEq/L 97 96 97  CO2 19 - 32 mEq/L $Remove'27 29 27  'fziblsB$ Calcium 8.4 - 10.5 mg/dL 9.2 9.6 9.6  Total Protein 6.0 - 8.3 g/dL 7.2 - -  Total Bilirubin 0.2 - 1.2 mg/dL 0.5 - -  Alkaline Phos 39 - 117 U/L 65 - -  AST 0 - 37 U/L 15 - -  ALT 0 - 35 U/L 15 - -     RADIOGRAPHIC STUDIES: I have personally reviewed the radiological images as listed and agreed with the findings in the report. US BREAST LTD UNI LEFT INC AXILLA  Result Date: 11/29/2020 CLINICAL DATA:  Patient for follow-up of left breast abscess. Patient has been on antibiotics for 1 week. EXAM: ULTRASOUND OF THE LEFT BREAST COMPARISON:  Previous exam(s). FINDINGS: On physical exam, there is minimal residual erythema within the anterior left breast at the site of concern. Targeted ultrasound is performed, showing interval decrease in size of retroareolar phlegmonous collection measuring 1.3 x 0.4 x 1.2 cm, previously 2.3 x 1.5 x 2.3 cm. IMPRESSION: Improving retroareolar abscess left breast 12 o'clock position. RECOMMENDATION: Patient should take antibiotics for 1 more week. Patient will need a refill of her current antibiotic treatment. Patient should return in 1 week for left breast ultrasound to assess for continued improvement/resolution. I have discussed the findings and recommendations with the patient. If applicable, a reminder letter will be sent to the patient regarding the next appointment. BI-RADS CATEGORY  3: Probably benign. Electronically  Signed   By: Lovey Newcomer M.D.   On: 11/29/2020 15:31  US BREAST LTD UNI LEFT INC AXILLA  Result Date: 11/22/2020 CLINICAL DATA:  Painful lump in the left retroareolar region. EXAM: DIGITAL DIAGNOSTIC BILATERAL MAMMOGRAM WITH TOMOSYNTHESIS AND CAD; ULTRASOUND LEFT BREAST LIMITED TECHNIQUE: Bilateral digital diagnostic mammography and breast tomosynthesis was performed. The images were evaluated with computer-aided detection.; Targeted ultrasound examination of the left breast was performed. COMPARISON:  None. ACR Breast Density Category b: There are scattered areas of fibroglandular density. FINDINGS: Skin thickening and increased density is seen in the anterior left breast. There is  focal increased density in the region of the patient's palpable lump. There is a prominent left axillary lymph node. No other suspicious mammographic findings are identified in either breast. On physical exam, there is erythema and a firm lump in the region the patient's symptoms on the left. Targeted ultrasound is performed, showing an abscess in the left retroareolar region. The abscess measures 2.3 x 1.5 by 2.7 cm. There is an abnormal node in the left axilla measuring 0.9 x 1.6 x 1.4 cm. The images were inadvertently marked as right axilla but this is incorrect. IMPRESSION: Probable abscess in the left retroareolar region. Abnormal node in the left axilla, likely reactive. RECOMMENDATION: Recommend aspiration of the left subareolar abscess. The imaging findings and patient's symptoms should be followed to complete resolution. Recommend a 3 month follow-up ultrasound of the left axilla to ensure the abnormal node returns to normal. I have discussed the findings and recommendations with the patient. If applicable, a reminder letter will be sent to the patient regarding the next appointment. BI-RADS CATEGORY  3: Probably benign. Electronically Signed   By: Dorise Bullion III M.D   On: 11/22/2020 17:42  MM DIAG BREAST TOMO  BILATERAL  Result Date: 11/22/2020 CLINICAL DATA:  Painful lump in the left retroareolar region. EXAM: DIGITAL DIAGNOSTIC BILATERAL MAMMOGRAM WITH TOMOSYNTHESIS AND CAD; ULTRASOUND LEFT BREAST LIMITED TECHNIQUE: Bilateral digital diagnostic mammography and breast tomosynthesis was performed. The images were evaluated with computer-aided detection.; Targeted ultrasound examination of the left breast was performed. COMPARISON:  None. ACR Breast Density Category b: There are scattered areas of fibroglandular density. FINDINGS: Skin thickening and increased density is seen in the anterior left breast. There is focal increased density in the region of the patient's palpable lump. There is a prominent left axillary lymph node. No other suspicious mammographic findings are identified in either breast. On physical exam, there is erythema and a firm lump in the region the patient's symptoms on the left. Targeted ultrasound is performed, showing an abscess in the left retroareolar region. The abscess measures 2.3 x 1.5 by 2.7 cm. There is an abnormal node in the left axilla measuring 0.9 x 1.6 x 1.4 cm. The images were inadvertently marked as right axilla but this is incorrect. IMPRESSION: Probable abscess in the left retroareolar region. Abnormal node in the left axilla, likely reactive. RECOMMENDATION: Recommend aspiration of the left subareolar abscess. The imaging findings and patient's symptoms should be followed to complete resolution. Recommend a 3 month follow-up ultrasound of the left axilla to ensure the abnormal node returns to normal. I have discussed the findings and recommendations with the patient. If applicable, a reminder letter will be sent to the patient regarding the next appointment. BI-RADS CATEGORY  3: Probably benign. Electronically Signed   By: Dorise Bullion III M.D   On: 11/22/2020 17:42  US BREAST ASPIRATION LEFT  Result Date: 11/22/2020 CLINICAL DATA:  55 year old female for aspiration of  LEFT breast abscess. Patient has a prescription for antibiotics from her primary care physician, which she says she will fill today. EXAM: ULTRASOUND GUIDED LEFT BREAST ASPIRATION COMPARISON:  Previous exams. PROCEDURE: The patient and I discussed the procedure of ultrasound-guided aspiration including benefits and alternatives. We discussed the high likelihood of a successful procedure. We discussed the risks of the procedure including infection, bleeding, tissue injury, and inadequate sampling. Informed written consent was given. The usual time out protocol was performed immediately prior to the procedure. Using sterile technique, 1% lidocaine, under direct ultrasound visualization, needle aspiration  of the RETROAREOLAR LEFT breast abscess was performed. 6 cc of pus was aspirated. A 0.7 x 2 x 0.8 cm residual collection/abscess is present. IMPRESSION: Ultrasound-guided aspiration of LEFT RETROAREOLAR abscess. No apparent complications. RECOMMENDATIONS: LEFT breast ultrasound follow-up in 1 week. Patient was reminded to fill her antibiotic prescription today. Electronically Signed   By: Harmon Pier M.D.   On: 11/22/2020 16:16   ASSESSMENT & PLAN: 55 yo female with   1.Chronic leukocytosis with predominant neutrophils, occasional lymphocytosis -we reviewed her medical record in detail with the patient  -she has a chronic mild leukocytosis with predominant neutrophilia since at least 2009, baseline WBC 12-17 range but >20 in times of acute stress/infection. No other cytopenias.  -We reviewed causes of leukocytosis such as infection/inflammation, steroids, malignancy, and reactive response to bleeding/IDA, smoking, etc  -she is UTD on age-appropriate cancer screenings.  -IDA resolved in the past with IV iron infusions. She is not anemic today -given the stable chronic nature of her leukocytosis/neutrophils, this is likely secondary to smoking, breast abscess, and co-morbidities including COPD -Given she has  occasional lymphocytosis, highest abs lymphs 4.2, it is reasonable to r/o primary bone marrow disorder such as MPN and CML -we reviewed some patients with CML can have splenomegaly, she agrees to abd [place] to r/o -If the above work up is negative, she can f/up with PCP -we strongly encouraged her to quit smoking cigarettes and marijuana and vaping. She has been referred to smoking cessation program -lab today for CBC with diff, JAK2, and bcr/abl, abdominal US in a week or so -I will call her with results and recommendation.  -f/up open   2. Co-morbidities - COPD, anxiety, h/o suicide attempt, gastritis, IDA, DM, OSA -continue medical regimen, f/up with PCP and mental health provider   3. Smoking cessation  -she has >40 history of smoking up to 2 PPD cigarettes. She has cut back to 1/2 PPD but vaping also.  -she smokes marijuana up 5-7 days per week when she can afford it -we reviewed the health risks associated with smoking. This is also likely the primary cause of her leukocytosis. She was strongly encouraged to quit.  -she is interested in quitting. Chantix worsened her depression. She will try nicotine products.  -she agrees to smoking cessation program referral which I placed today.  4. Family history  -Ms. Severa does not have a cancer diagnosis, but her mother had breast cancer diagnosis at age 25 and a paternal aunt also had breast cancer -pt is UTD with mammograms.  -I encouraged her to consider genetic testing to know more about her personal risk and her children's risks. She understands   PLAN: -Lab - CBC/diff, bcr/abl, JAK 2 -Abdominal US in 1 week -Will cal with results -F/up open   Orders Placed This Encounter  Procedures   US Abdomen Complete    Standing Status:   Future    Standing Expiration Date:   12/09/2021    Order Specific Question:   Reason for Exam (SYMPTOM  OR DIAGNOSIS REQUIRED)    Answer:   leukocytosis, r/o splenomegaly    Order Specific Question:    Preferred imaging location?    Answer:   [facility]   CBC with Differential (Cancer Center Only)    Standing Status:   Standing    Number of Occurrences:   1    Standing Expiration Date:   12/09/2021   BCR ABL1 FISH ([facility])    Standing Status:   Standing  Number of Occurrences:   1    Standing Expiration Date:   12/09/2021   Jak 2 Exon 12 ([facility])    Standing Status:   Standing    Number of Occurrences:   1    Standing Expiration Date:   12/09/2021   Ambulatory referral to Smoking Cessation Program    Referral Priority:   Routine    Referral Type:   Consultation    Referral Reason:   Specialty Services Required    Number of Visits Requested:   1       All questions were answered. The patient knows to call the clinic with any problems, questions or concerns.     Alla Feeling, NP 12/10/20 2:03 PM   Addendum  I have seen the patient, examined her. I agree with the assessment and and plan and have edited the notes.   55 yo female with PMH of heavy smoking, COPD, obesity, diabetes with neuropathy, Hypertension, anxiety depression, was referred for chronic leukocytosis, with predominance of neutrophils and occasional lymphocytosis.  This is likely benign and reactive process, secondary to smoking and other medical comorbidities.  Given the chronic course, overall stable leukocytosis, no anemia or thrombocytosis, the probability of myeloproliferative disorder, especially CML, or less likely, but we can rule out with BCR/ABL and JAK2 mutation tests, and [place] of abdomen to rule out splenomegaly.  If the above work up is negative, patient would not need follow-up with [place].  We discussed smoking cessation and will refer her. All questions were answered.  Truitt Merle MD 12/09/2020

## 2020-12-10 ENCOUNTER — Encounter: Payer: Self-pay | Admitting: Nurse Practitioner

## 2020-12-13 DIAGNOSIS — G4733 Obstructive sleep apnea (adult) (pediatric): Secondary | ICD-10-CM | POA: Diagnosis not present

## 2020-12-15 ENCOUNTER — Ambulatory Visit
Admission: RE | Admit: 2020-12-15 | Discharge: 2020-12-15 | Disposition: A | Discharge status: Home / Self Care | Payer: Medicare HMO | Source: Ambulatory Visit | Attending: Obstetrics & Gynecology | Admitting: Obstetrics & Gynecology

## 2020-12-15 ENCOUNTER — Other Ambulatory Visit: Payer: Self-pay

## 2020-12-15 DIAGNOSIS — N6452 Nipple discharge: Secondary | ICD-10-CM | POA: Diagnosis not present

## 2020-12-15 DIAGNOSIS — N611 Abscess of the breast and nipple: Secondary | ICD-10-CM

## 2020-12-16 ENCOUNTER — Ambulatory Visit (HOSPITAL_COMMUNITY)
Admission: RE | Admit: 2020-12-16 | Discharge: 2020-12-16 | Disposition: A | Discharge status: Home / Self Care | Payer: Medicare HMO | Source: Ambulatory Visit | Attending: Nurse Practitioner | Admitting: Nurse Practitioner

## 2020-12-16 DIAGNOSIS — K760 Fatty (change of) liver, not elsewhere classified: Secondary | ICD-10-CM | POA: Diagnosis not present

## 2020-12-16 DIAGNOSIS — Z9049 Acquired absence of other specified parts of digestive tract: Secondary | ICD-10-CM | POA: Diagnosis not present

## 2020-12-16 DIAGNOSIS — D72829 Elevated white blood cell count, unspecified: Secondary | ICD-10-CM | POA: Insufficient documentation

## 2020-12-16 LAB — JAK 2 EXON 12 (GENPATH)

## 2020-12-16 LAB — BCR ABL1 FISH (GENPATH)

## 2020-12-24 ENCOUNTER — Telehealth (HOSPITAL_COMMUNITY): Payer: Self-pay | Admitting: *Deleted

## 2020-12-24 ENCOUNTER — Other Ambulatory Visit (HOSPITAL_COMMUNITY): Payer: Self-pay | Admitting: Psychiatry

## 2020-12-24 DIAGNOSIS — F909 Attention-deficit hyperactivity disorder, unspecified type: Secondary | ICD-10-CM

## 2020-12-24 MED ORDER — AMPHETAMINE-DEXTROAMPHETAMINE 30 MG PO TABS: 30.0000 mg | ORAL_TABLET | Freq: Two times a day (BID) | ORAL | 0 refills | Status: DC

## 2020-12-24 NOTE — Telephone Encounter (Signed)
noted 

## 2020-12-24 NOTE — Telephone Encounter (Signed)
Patient called stating her original pharmacy ([facility]) informed her that they do not have Adderall in stock or in their warehouse.   Per pt she called [facility] on [address] in [place] and they informed her they have it in stock.   Per pt to please tell provider to please send her Adderall to [facility].

## 2020-12-24 NOTE — Telephone Encounter (Signed)
sent 

## 2020-12-25 DIAGNOSIS — G4733 Obstructive sleep apnea (adult) (pediatric): Secondary | ICD-10-CM | POA: Diagnosis not present

## 2021-01-05 ENCOUNTER — Other Ambulatory Visit: Payer: Self-pay

## 2021-01-05 DIAGNOSIS — E1142 Type 2 diabetes mellitus with diabetic polyneuropathy: Secondary | ICD-10-CM

## 2021-01-05 MED ORDER — PREGABALIN 150 MG PO CAPS: 150.0000 mg | ORAL_CAPSULE | Freq: Two times a day (BID) | ORAL | 0 refills | Status: DC

## 2021-01-05 NOTE — Telephone Encounter (Signed)
Refill request for:  Pregablin 150 mg LR 09/16/20, #180, 0 rf LOV 12/02/20 FOV 03/14/21  Please review and advise.  Thanks. Dm/cma

## 2021-01-10 ENCOUNTER — Other Ambulatory Visit: Payer: Self-pay

## 2021-01-10 DIAGNOSIS — G8929 Other chronic pain: Secondary | ICD-10-CM

## 2021-01-10 DIAGNOSIS — E1169 Type 2 diabetes mellitus with other specified complication: Secondary | ICD-10-CM

## 2021-01-10 DIAGNOSIS — M25511 Pain in right shoulder: Secondary | ICD-10-CM

## 2021-01-10 MED ORDER — MELOXICAM 15 MG PO TABS: 15.0000 mg | ORAL_TABLET | Freq: Every day | ORAL | 0 refills | Status: DC

## 2021-01-10 MED ORDER — DAPAGLIFLOZIN PROPANEDIOL 10 MG PO TABS: 10.0000 mg | ORAL_TABLET | Freq: Every day | ORAL | 1 refills | Status: DC

## 2021-01-10 NOTE — Telephone Encounter (Signed)
Refill request for the following meds:  Farxiga 10 mg LR  07/28/20, #90, 1 rf  Meloxicam 15 mg  LR 10/20/20, #90, 0 LOV 12/01/20 FOV 03/14/21  Please review and advise.   Thanks. Dm/cma

## 2021-01-13 DIAGNOSIS — G4733 Obstructive sleep apnea (adult) (pediatric): Secondary | ICD-10-CM | POA: Diagnosis not present

## 2021-01-21 ENCOUNTER — Emergency Department (HOSPITAL_COMMUNITY)
Admission: EM | Admit: 2021-01-21 | Discharge: 2021-01-21 | Disposition: A | Discharge status: Home / Self Care | Payer: Medicare HMO | Attending: Emergency Medicine | Admitting: Emergency Medicine

## 2021-01-21 ENCOUNTER — Other Ambulatory Visit: Payer: Self-pay

## 2021-01-21 ENCOUNTER — Encounter (HOSPITAL_COMMUNITY): Payer: Self-pay | Admitting: Emergency Medicine

## 2021-01-21 DIAGNOSIS — Z743 Need for continuous supervision: Secondary | ICD-10-CM | POA: Diagnosis not present

## 2021-01-21 DIAGNOSIS — J449 Chronic obstructive pulmonary disease, unspecified: Secondary | ICD-10-CM | POA: Diagnosis not present

## 2021-01-21 DIAGNOSIS — F17210 Nicotine dependence, cigarettes, uncomplicated: Secondary | ICD-10-CM | POA: Insufficient documentation

## 2021-01-21 DIAGNOSIS — R55 Syncope and collapse: Secondary | ICD-10-CM | POA: Diagnosis not present

## 2021-01-21 DIAGNOSIS — E119 Type 2 diabetes mellitus without complications: Secondary | ICD-10-CM | POA: Insufficient documentation

## 2021-01-21 DIAGNOSIS — I10 Essential (primary) hypertension: Secondary | ICD-10-CM | POA: Insufficient documentation

## 2021-01-21 DIAGNOSIS — Z794 Long term (current) use of insulin: Secondary | ICD-10-CM | POA: Insufficient documentation

## 2021-01-21 DIAGNOSIS — Z79899 Other long term (current) drug therapy: Secondary | ICD-10-CM | POA: Diagnosis not present

## 2021-01-21 DIAGNOSIS — R42 Dizziness and giddiness: Secondary | ICD-10-CM | POA: Insufficient documentation

## 2021-01-21 DIAGNOSIS — I959 Hypotension, unspecified: Secondary | ICD-10-CM | POA: Diagnosis not present

## 2021-01-21 DIAGNOSIS — R69 Illness, unspecified: Secondary | ICD-10-CM | POA: Diagnosis not present

## 2021-01-21 LAB — CBC WITH DIFFERENTIAL/PLATELET
Abs Immature Granulocytes: 0.09 10*3/uL — ABNORMAL HIGH (ref 0.00–0.07)
Basophils Absolute: 0.1 10*3/uL (ref 0.0–0.1)
Basophils Relative: 1 %
Eosinophils Absolute: 0.2 10*3/uL (ref 0.0–0.5)
Eosinophils Relative: 1 %
HCT: 45.8 % (ref 36.0–46.0)
Hemoglobin: 13.9 g/dL (ref 12.0–15.0)
Immature Granulocytes: 1 %
Lymphocytes Relative: 26 %
Lymphs Abs: 3.4 10*3/uL (ref 0.7–4.0)
MCH: 25.7 pg — ABNORMAL LOW (ref 26.0–34.0)
MCHC: 30.3 g/dL (ref 30.0–36.0)
MCV: 84.8 fL (ref 80.0–100.0)
Monocytes Absolute: 0.9 10*3/uL (ref 0.1–1.0)
Monocytes Relative: 7 %
Neutro Abs: 8.2 10*3/uL — ABNORMAL HIGH (ref 1.7–7.7)
Neutrophils Relative %: 64 %
Platelets: 277 10*3/uL (ref 150–400)
RBC: 5.4 MIL/uL — ABNORMAL HIGH (ref 3.87–5.11)
RDW: 16.8 % — ABNORMAL HIGH (ref 11.5–15.5)
WBC: 12.8 10*3/uL — ABNORMAL HIGH (ref 4.0–10.5)
nRBC: 0 % (ref 0.0–0.2)

## 2021-01-21 LAB — URINALYSIS, ROUTINE W REFLEX MICROSCOPIC
Bilirubin Urine: NEGATIVE
Glucose, UA: >=500 mg/dL — AB
Hgb urine dipstick: NEGATIVE
Ketones, ur: NEGATIVE mg/dL
Leukocytes,Ua: NEGATIVE
Nitrite: NEGATIVE
Protein, ur: NEGATIVE mg/dL
Specific Gravity, Urine: 1.008 (ref 1.005–1.030)
pH: 6 (ref 5.0–8.0)

## 2021-01-21 LAB — BASIC METABOLIC PANEL
Anion gap: 10 (ref 5–15)
BUN: 11 mg/dL (ref 6–20)
CO2: 28 mmol/L (ref 22–32)
Calcium: 8.8 mg/dL — ABNORMAL LOW (ref 8.9–10.3)
Chloride: 102 mmol/L (ref 98–111)
Creatinine, Ser: 0.79 mg/dL (ref 0.44–1.00)
GFR, Estimated: >60 mL/min (ref >60)
Glucose, Bld: 116 mg/dL — ABNORMAL HIGH (ref 70–99)
Potassium: 4.2 mmol/L (ref 3.5–5.1)
Sodium: 140 mmol/L (ref 135–145)

## 2021-01-21 MED ORDER — LACTATED RINGERS IV BOLUS
1000.0000 mL | Freq: Once | INTRAVENOUS | Status: AC
  Administered 2021-01-21: 1000 mL via INTRAVENOUS

## 2021-01-21 NOTE — ED Provider Notes (Signed)
[facility] EMERGENCY DEPARTMENT Provider Note  CSN: [id number] Arrival date & time: 01/21/21 1206    History Chief Complaint  Patient presents with  . Dizziness    [name] is a 55 y.o. female with extensive PMH as below reports she has been with her husband (patient in cancer center at [facility]) recently and has not been taking care of herself very well. She reports in the last 2 days she has been feeling lightheaded/dizzy with standing, feels like she might pass out but has not lost consciousness or fallen. She denies any CP, SOB. No N/V/D or dysuria no fever. Today she began shaking all over and her daughter thought she might be having a seizure. She did not lose consciousness.    Past Medical History:  Diagnosis Date  . ADHD   . Alcohol abuse   . Anemia   . Anxiety   . Asthmatic bronchitis    normal PFT/ seen by pulmonary no evidence of COPD  . Atypical chest pain 10/05/2017  . Back pain   . Bilateral swelling of feet   . Chest pain   . Chest pain on respiration 03/25/2014  . Chronic bronchitis ([place])   . Chronic lower back pain   . Chronic respiratory failure ([place]) 10/16/2011   Newly 02 dep 24/7 p discharge from [facility] 01/2013  - 03/13/2014  Walked RA  2 laps @ 185 ft each stopped due to  Sob/ aching in legs, thirsty/ no desat @ slow pace   . Chronic respiratory failure with hypoxia (HCC)    On 2-3 L of oxygen at home  . Cigarette smoker 12/02/2010   Followed in Pulmonary clinic/ [facility]/ [place]   - Limits of effective care reviewed 12/22/2011    . Complication of anesthesia   . Constipation   . COPD (chronic obstructive pulmonary disease) ([place])   . COPD with chronic bronchitis ([place]) 10/01/2017  . Daily headache   . Depression   . Diabetic peripheral neuropathy ([place])   . DM (diabetes mellitus) type II controlled, neurological manifestation ([place]) 12/02/2010  . Drug use   . Gallbladder problem   . Gastric erosions    EGD 08/2010.  [name] GERD  (gastroesophageal reflux disease)   . Glaucoma   . H/O drug abuse ([place]) 11/12/2017   -- scanned document from outside source: [facility] in [place] which showed UDS positive for Adderall /amphetamine usage as well as positive urine for THC.  This test result was collected 08/11/2016 and reported 10/07/2016. - lso review of the chart shows another positive amphetamine, THC and METH in the urine back on 10/18/2015 under "care everywhere". ---So   . Heavy menses   . High cholesterol   . History of blood transfusion    "related to low HgB" (10/05/2017)  . History of hiatal hernia   . HTN (hypertension)   . Hyperlipidemia associated with type 2 diabetes mellitus ([place]) 10/18/2015  . Hypertension associated with diabetes ([place]) 12/02/2010   D/c acei 12/22/2011 due to psuedowheeze and narcotic dependent cough> ? Improved - 38/75/6433 started bystolic in place of cozar due to cough    . IBS (irritable bowel syndrome)   . Increased urinary protein excretion   . Internal hemorrhoids    Colonoscopy 5/12.  . Iron deficiency anemia 10/01/2017  . Joint pain   . Mixed diabetic hyperlipidemia associated with type 2 diabetes mellitus ([place]) 10/01/2017  . On home oxygen therapy    "  5L at night" (10/05/2017)  . OSA on CPAP   . Osteoarthritis    "back" (10/05/2017)  . Oxygen dependent 10/16/2011  . Pneumonia    "lots of times" (10/05/2017)  . PONV (postoperative nausea and vomiting)   . Poorly controlled diabetes mellitus ([place]) 11/12/2017  . Pulmonary infiltrates 12/22/2011   Followed in Pulmonary clinic/ [facility]/ [place]    - See CT Chest 05/05/11   . Shortness of breath   . Tachycardia    never had test done since no insurance  . Tobacco use disorder-current smoker greater than 40-pack-year history- since age 68 1 ppd 10/01/2017  . Type II diabetes mellitus ([place])   . Vitamin D deficiency     Past Surgical History:  Procedure Laterality Date  . CESAREAN SECTION   1988; 1989  . CHOLECYSTECTOMY OPEN  1990  . COLONOSCOPY  09/16/2010   RXV:QMGQQP colon/small internal hemorrhoids  . ESOPHAGOGASTRODUODENOSCOPY  09/16/2010   SLF: normal/mild gastritis  . ESOPHAGOGASTRODUODENOSCOPY N/A 10/19/2014   Procedure: ESOPHAGOGASTRODUODENOSCOPY (EGD);  Surgeon: Danie Binder, MD;  Location: [facility];  Service: Endoscopy;  Laterality: N/A;  830  . FRACTURE SURGERY    . HYSTEROSCOPY WITH THERMACHOICE  01/17/2012   Procedure: HYSTEROSCOPY WITH THERMACHOICE;  Surgeon: Florian Buff, MD;  Location: [facility] ORS;  Service: Gynecology;  Laterality: N/A;  total therapy time: 9:13sec  D5W  18 ml in, D5W   2m out, temperture 87degrees celcious  . KIDNEY SURGERY     as child for blockages  . LEFT HEART CATH AND CORONARY ANGIOGRAPHY N/A 09/08/2019   Procedure: LEFT HEART CATH AND CORONARY ANGIOGRAPHY;  Surgeon: JMartinique Peter M, MD;  [facility];  Service: Cardiovascular;  Laterality: N/A;  . TONSILLECTOMY    . TUBAL LIGATION  1989  . TYMPANOSTOMY TUBE PLACEMENT Bilateral    "several times when I was a child"  . uterine ablation    . WRIST FRACTURE SURGERY Left 1995    Family History  Problem Relation Age of Onset  . Heart attack Mother 567      deceased  . Diabetes Mother   . Breast cancer Mother 421      inflammatory breast ca  . Heart failure Mother        oxygen dependence, nonsmoker  . Heart disease Mother   . Depression Mother   . Cancer Mother   . Hypertension Mother   . Hyperlipidemia Mother   . Sudden death Mother   . Sleep apnea Mother   . Obesity Mother   . Heart attack Father 461      deceased, etoh use  . Heart disease Father   . Alcohol abuse Father   . Depression Father   . Heart failure Father   . Sudden death Father   . Ulcers Sister   . Hypertension Sister   . Heart failure Sister   . Depression Sister   . Anxiety disorder Sister   . Liver disease Maternal AAunt 73      died while on liver transplant list  . Heart  attack Maternal Grandmother        premature CAD  . Colon cancer Neg Hx     Social History   Tobacco Use  . Smoking status: Every Day    Packs/day: 1.50    Years: 40.00    Pack years: 60.00    Types: Cigarettes    Start date: 89 . Smokeless tobacco: Current  .  Tobacco comments:    vape  Vaping Use  . Vaping Use: Every day  Substance Use Topics  . Alcohol use: Not Currently    Comment: once a month - 3-4 mixed drinks  . Drug use: Yes    Types: Marijuana    Comment: daily use     Home Medications Prior to Admission medications   Medication Sig Start Date End Date Taking? Authorizing Provider  ACCU-CHEK GUIDE test strip USE UP TO FOUR TIMES DAILY AS DIRECTED 08/18/19   Cirigliano, Mary K, DO  Accu-Chek Softclix Lancets lancets USE UP TO FOUR TIMES DAILY AS DIRECTED 06/20/19   Cirigliano, Mary K, DO  albuterol (VENTOLIN HFA) 108 (90 Base) MCG/ACT inhaler Inhale 2 puffs into the lungs every 6 (six) hours as needed for wheezing or shortness of breath.    [provider]  amphetamine-dextroamphetamine (ADDERALL) 30 MG tablet Take 1 tablet by mouth 2 (two) times daily. 12/24/20 12/24/21  Cloria Spring, MD  ARIPiprazole (ABILIFY) 5 MG tablet Take 1 tablet (5 mg total) by mouth daily. 11/24/20 11/24/21  Cloria Spring, MD  atorvastatin (LIPITOR) 40 MG tablet Take 0.5 tablets (20 mg total) by mouth daily. 12/02/20   Haydee Salter, MD  blood glucose meter kit and supplies Dispense based on patient and insurance preference. Use up to four times daily as directed. (FOR ICD-10 E10.9, E11.9). 06/20/19   Cirigliano, Garvin Fila, DO  cephALEXin (KEFLEX) 500 MG capsule Take 1 capsule (500 mg total) by mouth 3 (three) times daily. 12/03/20   [name], MD  clonazePAM (KLONOPIN) 0.5 MG tablet Take 1 tablet (0.5 mg total) by mouth 2 (two) times daily. 11/24/20 11/24/21  Cloria Spring, MD  dapagliflozin propanediol (FARXIGA) 10 MG TABS tablet Take 1 tablet (10 mg total) by mouth daily. 01/10/21    Libby Maw, MD  DULoxetine (CYMBALTA) 60 MG capsule Take 1 capsule (60 mg total) by mouth daily. 11/24/20   Cloria Spring, MD  fluticasone (FLONASE) 50 MCG/ACT nasal spray INSTILL 1 SPRAY INTO EACH NOSTRIL TWICE DAILY AFTER SINUS RINSES. PLEASE SCHEDULE APPOINTMENT FOR FURTHER REFILLS 05/11/20   Ronnald Nian, DO  furosemide (LASIX) 40 MG tablet AS NEEDED for weight increase of 3 lbs overnight or 5 lbs in 1 week 10/29/19   Donato Heinz, MD  glipiZIDE (GLUCOTROL) 5 MG tablet Take 1 tablet (5 mg total) by mouth 2 (two) times daily before a meal. 12/02/20   Haydee Salter, MD  ibuprofen (ADVIL) 200 MG tablet Take 200 mg by mouth every 6 (six) hours as needed.    [provider]  Insulin Pen Needle (BD PEN NEEDLE NANO 2ND GEN) 32G X 4 MM MISC USE DAILY WITH VICTOZA 06/25/20   Cirigliano, Mary K, DO  isosorbide mononitrate (IMDUR) 30 MG 24 hr tablet TAKE 1 TABLET BY MOUTH EVERY DAY 01/27/20   Dunn, Nedra Hai, PA-C  Lancets Misc. (ACCU-CHEK SOFTCLIX LANCET DEV) KIT Check Blood sugar up to 4 times a day 06/20/19   Cirigliano, Mary K, DO  liraglutide (VICTOZA) 18 MG/3ML SOPN Inject 1.8 mg into the skin daily. 10/20/20   Cirigliano, Garvin Fila, DO  losartan (COZAAR) 25 MG tablet TAKE 1 TABLET(25 MG) BY MOUTH DAILY Patient taking differently: 12.5 mg. 11/18/20   Cirigliano, Garvin Fila, DO  meloxicam (MOBIC) 15 MG tablet Take 1 tablet (15 mg total) by mouth daily. 01/10/21   Libby Maw, MD  metoprolol tartrate (LOPRESSOR) 25 MG tablet Take 1  tablet (25 mg total) by mouth daily. 10/28/20 01/26/21  Cirigliano, Garvin Fila, DO  nystatin (MYCOSTATIN/NYSTOP) powder Apply 1 application topically 2 (two) times daily. 11/19/20   [name], MD  pantoprazole (PROTONIX) 40 MG tablet Take 1 tablet by mouth daily. 10/25/16   [provider]  pregabalin (LYRICA) 150 MG capsule Take 1 capsule (150 mg total) by mouth 2 (two) times daily. 01/05/21   Haydee Salter, MD     Allergies     Codeine, Metformin and related, Wellbutrin [bupropion hcl], and Acyclovir and related   Review of Systems   Review of Systems A comprehensive review of systems was completed and negative except as noted in HPI.    Physical Exam BP 114/73 (BP Location: Left Arm)   Pulse 78   Temp 98 F (36.7 C) (Oral)   Resp 16   LMP 08/01/2014 Comment: irregular  SpO2 98%   Physical Exam Vitals and nursing note reviewed.  Constitutional:      Appearance: Normal appearance.  HENT:     Head: Normocephalic and atraumatic.     Nose: Nose normal.     Mouth/Throat:     Mouth: Mucous membranes are moist.  Eyes:     Extraocular Movements: Extraocular movements intact.     Conjunctiva/sclera: Conjunctivae normal.  Cardiovascular:     Rate and Rhythm: Normal rate.  Pulmonary:     Effort: Pulmonary effort is normal.     Breath sounds: Normal breath sounds.  Abdominal:     General: Abdomen is flat.     Palpations: Abdomen is soft.     Tenderness: There is no abdominal tenderness.  Musculoskeletal:        General: No swelling. Normal range of motion.     Cervical back: Neck supple.  Skin:    General: Skin is warm and dry.  Neurological:     General: No focal deficit present.     Mental Status: She is alert.  Psychiatric:        Mood and Affect: Mood normal.     ED Results / Procedures / Treatments   Labs (all labs ordered are listed, but only abnormal results are displayed) Labs Reviewed  BASIC METABOLIC PANEL  CBC WITH DIFFERENTIAL/PLATELET  URINALYSIS, ROUTINE W REFLEX MICROSCOPIC    EKG None  Radiology No results found.  Procedures Procedures  Medications Ordered in the ED Medications  lactated ringers bolus 1,000 mL (has no administration in time range)     MDM Rules/Calculators/A&P MDM Patient with lightheadedness, she thinks she is dehydrated due to poor PO intake. Will check orthostatics, check labs, IVF and reassess.   ED Course  I have reviewed the triage  vital signs and the nursing notes.  Pertinent labs & imaging results that were available during my care of the patient were reviewed by me and considered in my medical decision making (see chart for details).  Clinical Course as of 01/21/21 1650  Fri Jan 21, 2021  1321 CBC with mild leukocytosis, at baseline.  [CS]  1412 BMP is unremarkable, patient is not orthostatic.  [CS]  1458 UA with glucosuria, otherwise neg.  [CS]  1502 Patient feeling better after IVF, ambulatory to the bathroom without difficulty and eager to go home. Recommend she drink plenty of fluids, follow up with PCP and RTED for any other concerns.  [CS]    Clinical Course User Index [CS] Truddie Hidden, MD    Final Clinical Impression(s) / ED Diagnoses Final diagnoses:  None    Rx / DC Orders ED Discharge Orders     None        Truddie Hidden, MD 01/21/21 1650

## 2021-01-21 NOTE — ED Triage Notes (Signed)
Per EMS, patient from home, lightheaded and dizzy since last night. Hx of same with dehydration. Husband currently admitted and reports decreased self care as she has been assisting to care for him.   20g R AC 51ml NS  BP 134/92 80 HR 99% RA  CBG 165

## 2021-01-24 ENCOUNTER — Telehealth (HOSPITAL_COMMUNITY): Payer: Self-pay | Admitting: *Deleted

## 2021-01-24 ENCOUNTER — Other Ambulatory Visit (HOSPITAL_COMMUNITY): Payer: Self-pay | Admitting: Psychiatry

## 2021-01-24 DIAGNOSIS — F909 Attention-deficit hyperactivity disorder, unspecified type: Secondary | ICD-10-CM

## 2021-01-24 DIAGNOSIS — G4733 Obstructive sleep apnea (adult) (pediatric): Secondary | ICD-10-CM | POA: Diagnosis not present

## 2021-01-24 MED ORDER — AMPHETAMINE-DEXTROAMPHETAMINE 30 MG PO TABS: 30.0000 mg | ORAL_TABLET | Freq: Two times a day (BID) | ORAL | 0 refills | Status: DC

## 2021-01-24 NOTE — Telephone Encounter (Signed)
Patient called stating she tried calling [facility] again to see if they have Adderall back in stock again per last month and was informed they don't.   Per pt she called [facility] on [address] in [place] and they informed her they have it in stock.    Per pt to please tell provider to please send her Adderall to [facility].

## 2021-01-24 NOTE — Telephone Encounter (Signed)
sent 

## 2021-02-10 ENCOUNTER — Other Ambulatory Visit (HOSPITAL_COMMUNITY): Payer: Self-pay | Admitting: Psychiatry

## 2021-02-12 DIAGNOSIS — G4733 Obstructive sleep apnea (adult) (pediatric): Secondary | ICD-10-CM | POA: Diagnosis not present

## 2021-02-23 ENCOUNTER — Encounter: Payer: Self-pay | Admitting: Obstetrics & Gynecology

## 2021-02-23 ENCOUNTER — Telehealth (HOSPITAL_COMMUNITY): Payer: Self-pay | Admitting: *Deleted

## 2021-02-23 ENCOUNTER — Other Ambulatory Visit: Payer: Self-pay

## 2021-02-23 ENCOUNTER — Other Ambulatory Visit (HOSPITAL_COMMUNITY)
Admission: RE | Admit: 2021-02-23 | Discharge: 2021-02-23 | Disposition: A | Discharge status: Home / Self Care | Payer: Medicare HMO | Source: Ambulatory Visit | Attending: Obstetrics & Gynecology | Admitting: Obstetrics & Gynecology

## 2021-02-23 ENCOUNTER — Other Ambulatory Visit (HOSPITAL_COMMUNITY): Payer: Self-pay | Admitting: Psychiatry

## 2021-02-23 ENCOUNTER — Ambulatory Visit (INDEPENDENT_AMBULATORY_CARE_PROVIDER_SITE_OTHER): Payer: Medicare HMO | Admitting: Obstetrics & Gynecology

## 2021-02-23 VITALS — BP 106/64 | HR 84 | Resp 16 | Ht 63.75 in | Wt 275.0 lb

## 2021-02-23 DIAGNOSIS — Z6841 Body Mass Index (BMI) 40.0 and over, adult: Secondary | ICD-10-CM | POA: Diagnosis not present

## 2021-02-23 DIAGNOSIS — Z01419 Encounter for gynecological examination (general) (routine) without abnormal findings: Secondary | ICD-10-CM

## 2021-02-23 DIAGNOSIS — Z780 Asymptomatic menopausal state: Secondary | ICD-10-CM

## 2021-02-23 DIAGNOSIS — F909 Attention-deficit hyperactivity disorder, unspecified type: Secondary | ICD-10-CM

## 2021-02-23 DIAGNOSIS — E66813 Obesity, class 3: Secondary | ICD-10-CM

## 2021-02-23 MED ORDER — AMPHETAMINE-DEXTROAMPHETAMINE 30 MG PO TABS: 30.0000 mg | ORAL_TABLET | Freq: Two times a day (BID) | ORAL | 0 refills | Status: DC

## 2021-02-23 NOTE — Telephone Encounter (Signed)
Patient called stating she would like for provider to please call in her Adderall to [facility] off of [address] due to [facility] still being out of her Adderall.

## 2021-02-23 NOTE — Telephone Encounter (Signed)
sent 

## 2021-02-23 NOTE — Progress Notes (Signed)
[name] 29-Jan-1966 [id number]   History:    55 y.o. G2P2L2  Married x 4 years.  S/P TL.  Husband with Metastatic Ca.   RP: Established patient presenting for annual gynecologic exam   HPI:  S/P TL.  Endometrial ablation in 2013.  Postmenopause, well on no HRT.  No PMB.  No pelvic pain.  Currently abstinent. Breasts normal now.  Left Dx mammo/US Prob. Benign in 11/2020.  Urine/BMs normal.  BMI 47.57.  DM type 2.  CHTN.  Hypercholesterolemia. Health labs with Fam MD.   Past medical history,surgical history, family history and social history were all reviewed and documented in the EPIC chart.  Gynecologic History Patient's last menstrual period was 08/01/2014.  Obstetric History OB History  Gravida Para Term Preterm AB Living  2 2 2     2   SAB IAB Ectopic Multiple Live Births               # Outcome Date GA Lbr Len/2nd Weight Sex Delivery Anes PTL Lv  2 Term           1 Term              ROS: A ROS was performed and pertinent positives and negatives are included in the history.  GENERAL: No fevers or chills. HEENT: No change in vision, no earache, sore throat or sinus congestion. NECK: No pain or stiffness. CARDIOVASCULAR: No chest pain or pressure. No palpitations. PULMONARY: No shortness of breath, cough or wheeze. GASTROINTESTINAL: No abdominal pain, nausea, vomiting or diarrhea, melena or bright red blood per rectum. GENITOURINARY: No urinary frequency, urgency, hesitancy or dysuria. MUSCULOSKELETAL: No joint or muscle pain, no back pain, no recent trauma. DERMATOLOGIC: No rash, no itching, no lesions. ENDOCRINE: No polyuria, polydipsia, no heat or cold intolerance. No recent change in weight. HEMATOLOGICAL: No anemia or easy bruising or bleeding. NEUROLOGIC: No headache, seizures, numbness, tingling or weakness. PSYCHIATRIC: No depression, no loss of interest in normal activity or change in sleep pattern.     Exam:   BP 106/64   Pulse 84   Resp 16   Ht 5'  3.75" (1.619 m)   Wt 275 lb (124.7 kg)   LMP 08/01/2014 Comment: irregular  BMI 47.57 kg/m   Body mass index is 47.57 kg/m.  General appearance : Well developed well nourished female. No acute distress HEENT: Eyes: no retinal hemorrhage or exudates,  Neck supple, trachea midline, no carotid bruits, no thyroidmegaly Lungs: Clear to auscultation, no rhonchi or wheezes, or rib retractions  Heart: Regular rate and rhythm, no murmurs or gallops Breast:Examined in sitting and supine position were symmetrical in appearance, no palpable masses or tenderness,  no skin retraction, no nipple inversion, no nipple discharge, no skin discoloration, no axillary or supraclavicular lymphadenopathy Abdomen: no palpable masses or tenderness, no rebound or guarding Extremities: no edema or skin discoloration or tenderness  Pelvic: Vulva: Normal             Vagina: No gross lesions or discharge  Cervix: No gross lesions or discharge.  Pap reflex done.  Uterus  AV, normal size, shape and consistency, non-tender and mobile  Adnexa  Without masses or tenderness  Anus: Normal   Assessment/Plan:  55 y.o. female for annual exam   1. Encounter for routine gynecological examination with Papanicolaou smear of cervix Normal gynecologic exam.  Pap reflex done.  Breast exam normal.  Left diagnostic mammogram with breast ultrasound was probably benign in August  2022.  Colonoscopy 2016.  Recommend contacting gastro to repeat colonoscopy.  Health labs with family physician. - Cytology - PAP( [place])  2. Postmenopause Well on no hormone replacement therapy.  No postmenopausal bleeding.  3. Class 3 severe obesity due to excess calories with serious comorbidity and body mass index (BMI) of 45.0 to 49.9 in adult [name]) Recommend a lower calorie/carb diet.  Recommend regular walking and starting back with aerobic activities and light weightlifting at the gym 5 times a week.  Other orders - atorvastatin (LIPITOR) 20  MG tablet; Take 20 mg by mouth 2 (two) times daily.   [name] MD, 1:56 PM 02/23/2021

## 2021-02-24 ENCOUNTER — Telehealth (INDEPENDENT_AMBULATORY_CARE_PROVIDER_SITE_OTHER): Payer: Medicare HMO | Admitting: Psychiatry

## 2021-02-24 ENCOUNTER — Encounter (HOSPITAL_COMMUNITY): Payer: Self-pay | Admitting: Psychiatry

## 2021-02-24 DIAGNOSIS — F909 Attention-deficit hyperactivity disorder, unspecified type: Secondary | ICD-10-CM

## 2021-02-24 DIAGNOSIS — F331 Major depressive disorder, recurrent, moderate: Secondary | ICD-10-CM | POA: Diagnosis not present

## 2021-02-24 DIAGNOSIS — R69 Illness, unspecified: Secondary | ICD-10-CM | POA: Diagnosis not present

## 2021-02-24 DIAGNOSIS — G4733 Obstructive sleep apnea (adult) (pediatric): Secondary | ICD-10-CM | POA: Diagnosis not present

## 2021-02-24 MED ORDER — DULOXETINE HCL 60 MG PO CPEP: 60.0000 mg | ORAL_CAPSULE | Freq: Every day | ORAL | 2 refills | Status: DC

## 2021-02-24 MED ORDER — AMPHETAMINE-DEXTROAMPHETAMINE 30 MG PO TABS: 30.0000 mg | ORAL_TABLET | Freq: Three times a day (TID) | ORAL | 0 refills | Status: DC

## 2021-02-24 MED ORDER — ARIPIPRAZOLE 5 MG PO TABS: 5.0000 mg | ORAL_TABLET | Freq: Every day | ORAL | 2 refills | Status: DC

## 2021-02-24 MED ORDER — AMPHETAMINE-DEXTROAMPHETAMINE 30 MG PO TABS: 30.0000 mg | ORAL_TABLET | Freq: Two times a day (BID) | ORAL | 0 refills | Status: DC

## 2021-02-24 MED ORDER — CLONAZEPAM 0.5 MG PO TABS: 0.5000 mg | ORAL_TABLET | Freq: Two times a day (BID) | ORAL | 2 refills | Status: DC | PRN

## 2021-02-24 NOTE — Progress Notes (Signed)
Virtual Visit via Telephone Note  I connected with [name] on 02/24/21 at 10:20 AM EDT by telephone and verified that I am speaking with the correct person using two identifiers.  Location: Patient: home Provider: office   I discussed the limitations, risks, security and privacy concerns of performing an evaluation and management service by telephone and the availability of in person appointments. I also discussed with the patient that there may be a patient responsible charge related to this service. The patient expressed understanding and agreed to proceed.     I discussed the assessment and treatment plan with the patient. The patient was provided an opportunity to ask questions and all were answered. The patient agreed with the plan and demonstrated an understanding of the instructions.   The patient was advised to call back or seek an in-person evaluation if the symptoms worsen or if the condition fails to improve as anticipated.  I provided 14 minutes of non-face-to-face time during this encounter.   [name], MD  [facility] MD/PA/NP OP Progress Note  02/24/2021 10:48 AM [name]  MRN:  [id number]  Chief Complaint:  Chief Complaint   Depression; ADHD; Follow-up    HPI: This patient is a 55 year old married white female who lives with her husband and her daughter her husband's daughter and her daughters to the children in [place].  She is on disability.  The patient returns for follow-up after 3 months.  She states that her husband had a recurrence of his throat cancer.  He had surgery in the end of September and had to have his larynx removed along with his thyroid and parathyroid gland.  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  She is hoping he will not need any more chemotherapy.  He has lost his speech but is using a specialized notepad to communicate.  Despite this she states  that her mood is fairly stable.  She denies significant depression thoughts of self-harm or suicidal ideation.  She uses the clonazepam occasionally but generally her anxiety is under good control and she is sleeping well.  Her focus is good with the Adderall Visit Diagnosis:    ICD-10-CM   1. Moderate episode of recurrent major depressive disorder (HCC)  F33.1     2. Adult attention deficit hyperactivity disorder  F90.9 amphetamine-dextroamphetamine (ADDERALL) 30 MG tablet    DISCONTINUED: amphetamine-dextroamphetamine (ADDERALL) 30 MG tablet      Past Psychiatric History: The patient was hospitalized twice in her 13s and again in 2006 and 2020 for drug overdose with suicidal intent  Past Medical History:  Past Medical History:  Diagnosis Date   ADHD    Alcohol abuse    Anemia    Anxiety    Asthmatic bronchitis    normal PFT/ seen by pulmonary no evidence of COPD   Atypical chest pain 10/05/2017   Back pain    Bilateral swelling of feet    Chest pain    Chest pain on respiration 03/25/2014   Chronic bronchitis (HCC)    Chronic lower back pain    Chronic respiratory failure ([place]) 10/16/2011   Newly 02 dep 24/7 p discharge [facility] 01/2013  - 03/13/2014  Walked RA  2 laps @ 185 ft each stopped due to  Sob/ aching in legs, thirsty/ no desat @ slow pace    Chronic respiratory failure with hypoxia ([place])    On  2-3 L of oxygen at home   Cigarette smoker 12/02/2010   Followed in Pulmonary clinic/ [facility]/ [place]   - Limits of effective care reviewed 12/19/32     Complication of anesthesia    Constipation    COPD (chronic obstructive pulmonary disease) (HCC)    COPD with chronic bronchitis ([place]) 10/01/2017   Daily headache    Depression    Diabetic peripheral neuropathy ([place])    DM (diabetes mellitus) type II controlled, neurological manifestation ([place]) 12/02/2010   Drug use    Gallbladder problem    Gastric erosions    EGD 08/2010.   GERD (gastroesophageal reflux disease)     Glaucoma    H/O drug abuse ([place]) 11/12/2017   -- scanned document from outside source: [facility] in [place] which showed UDS positive for Adderall /amphetamine usage as well as positive urine for THC.  This test result was collected 08/11/2016 and reported 10/07/2016. - lso review of the chart shows another positive amphetamine, THC and METH in the urine back on 10/18/2015 under "care everywhere". ---So    Heavy menses    High cholesterol    History of blood transfusion    "related to low HgB" (10/05/2017)   History of hiatal hernia    HTN (hypertension)    Hyperlipidemia associated with type 2 diabetes mellitus ([place]) 10/18/2015   Hypertension associated with diabetes ([place]) 12/02/2010   D/c acei 12/22/2011 due to psuedowheeze and narcotic dependent cough> ? Improved - 91/79/1505 started bystolic in place of cozar due to cough     IBS (irritable bowel syndrome)    Increased urinary protein excretion    Internal hemorrhoids    Colonoscopy 5/12.   Iron deficiency anemia 10/01/2017   Joint pain    Mixed diabetic hyperlipidemia associated with type 2 diabetes mellitus ([place]) 10/01/2017   On home oxygen therapy    "5L at night" (10/05/2017)   OSA on CPAP    Osteoarthritis    "back" (10/05/2017)   Oxygen dependent 10/16/2011   Pneumonia    "lots of times" (10/05/2017)   PONV (postoperative nausea and vomiting)    Poorly controlled diabetes mellitus ([place]) 11/12/2017   Pulmonary infiltrates 12/22/2011   Followed in Pulmonary clinic/ [facility]/ [place]    - See CT Chest 05/05/11    Shortness of breath    Tachycardia    never had test done since no insurance   Tobacco use disorder-current smoker greater than 40-pack-year history- since age 51 1 ppd 10/01/2017   Type II diabetes mellitus ([place])    Vitamin D deficiency     Past Surgical History:  Procedure Laterality Date   CESAREAN SECTION  1988; [place]   COLONOSCOPY  09/16/2010    [id number]:[id number] colon/small internal hemorrhoids   ESOPHAGOGASTRODUODENOSCOPY  09/16/2010   SLF: normal/mild gastritis   ESOPHAGOGASTRODUODENOSCOPY N/A 10/19/2014   Procedure: ESOPHAGOGASTRODUODENOSCOPY (EGD);  Surgeon: Danie Binder, MD;  Location: [facility];  Service: Endoscopy;  Laterality: N/A;  [place]  01/17/2012   Procedure: HYSTEROSCOPY WITH THERMACHOICE;  Surgeon: Florian Buff, MD;  Location: [facility] ORS;  Service: Gynecology;  Laterality: N/A;  total therapy time: 9:13sec  D5W  18 ml in, D5W   48m out, temperture 87degrees celcious   KIDNEY SURGERY     as child for blockages   LEFT HEART CATH AND CORONARY ANGIOGRAPHY N/A 09/08/2019  Procedure: LEFT HEART CATH AND CORONARY ANGIOGRAPHY;  Surgeon: Martinique, Peter M, MD;  Location: [facility];  Service: Cardiovascular;  Laterality: N/A;   TONSILLECTOMY     TUBAL LIGATION  1989   TYMPANOSTOMY TUBE PLACEMENT Bilateral    "several times when I was a child"   uterine ablation     WRIST FRACTURE SURGERY Left 1995    Family Psychiatric History: see below  Family History:  Family History  Problem Relation Age of Onset   Heart attack Mother 22       deceased   Diabetes Mother    Breast cancer Mother 40       inflammatory breast ca   Heart failure Mother        oxygen dependence, nonsmoker   Heart disease Mother    Depression Mother    Cancer Mother    Hypertension Mother    Hyperlipidemia Mother    Sudden death Mother    Sleep apnea Mother    Obesity Mother    Heart attack Father 60       deceased, etoh use   Heart disease Father    Alcohol abuse Father    Depression Father    Heart failure Father    Sudden death Father    Ulcers Sister    Hypertension Sister    Heart failure Sister    Depression Sister    Anxiety disorder Sister    Liver disease Maternal Aunt 40       died while on liver transplant list   Heart attack Maternal Grandmother        premature CAD    Colon cancer Neg Hx     Social History:  Social History   Socioeconomic History   Marital status: Married    Spouse name: Not on file   Number of children: 2   Years of education: Not on file   Highest education level: Not on file  Occupational History   Occupation: unemployed  Tobacco Use   Smoking status: Every Day    Packs/day: 1.50    Years: 40.00    Pack years: 60.00    Types: Cigarettes, E-cigarettes    Start date: 1980   Smokeless tobacco: Current   Tobacco comments:    vape  Scientific laboratory technician Use: Every day  Substance and Sexual Activity   Alcohol use: Not Currently   Drug use: Yes    Types: Marijuana    Comment: occ   Sexual activity: Not Currently    Partners: Male    Birth control/protection: Surgical    Comment: BTL, younger than 88, more than 5  Other Topics Concern   Not on file  Social History Narrative   Not on file   Social Determinants of Health   Financial Resource Strain: Not on file  Food Insecurity: Not on file  Transportation Needs: Not on file  Physical Activity: Not on file  Stress: Not on file  Social Connections: Not on file    Allergies:  Allergies  Allergen Reactions   Codeine Itching and Nausea Only   Metformin And Related Diarrhea and Other (See Comments)    Stomach pain/nausea   Wellbutrin [Bupropion Hcl] Hives   Acyclovir And Related Rash    Metabolic Disorder Labs: Lab Results  Component Value Date   HGBA1C 9.7 (H) 10/20/2020   MPG 188.64 03/05/2019   MPG 160 (H) 03/10/2015   No results found for: PROLACTIN Lab Results  Component  Value Date   CHOL 215 (H) 10/20/2020   TRIG 146.0 10/20/2020   HDL 43.10 10/20/2020   CHOLHDL 5 10/20/2020   VLDL 29.2 10/20/2020   LDLCALC 143 (H) 10/20/2020   LDLCALC 85 06/30/2019   Lab Results  Component Value Date   TSH 1.860 06/30/2019   TSH 2.40 11/26/2018    Therapeutic Level Labs: No results found for: LITHIUM No results found for: VALPROATE No components  found for:  CBMZ  Current Medications: Current Outpatient Medications  Medication Sig Dispense Refill   amphetamine-dextroamphetamine (ADDERALL) 30 MG tablet Take 1 tablet by mouth every 8 (eight) hours. 90 tablet 0   ACCU-CHEK GUIDE test strip USE UP TO FOUR TIMES DAILY AS DIRECTED 200 strip 11   Accu-Chek Softclix Lancets lancets USE UP TO FOUR TIMES DAILY AS DIRECTED 300 each 0   albuterol (VENTOLIN HFA) 108 (90 Base) MCG/ACT inhaler Inhale 2 puffs into the lungs every 6 (six) hours as needed for wheezing or shortness of breath.     amphetamine-dextroamphetamine (ADDERALL) 30 MG tablet Take 1 tablet by mouth 2 (two) times daily. 60 tablet 0   amphetamine-dextroamphetamine (ADDERALL) 30 MG tablet Take 1 tablet by mouth 2 (two) times daily. 60 tablet 0   ARIPiprazole (ABILIFY) 5 MG tablet Take 1 tablet (5 mg total) by mouth daily. 30 tablet 2   atorvastatin (LIPITOR) 20 MG tablet Take 20 mg by mouth 2 (two) times daily.     blood glucose meter kit and supplies Dispense based on patient and insurance preference. Use up to four times daily as directed. (FOR ICD-10 E10.9, E11.9). 1 each 0   clonazePAM (KLONOPIN) 0.5 MG tablet Take 1 tablet (0.5 mg total) by mouth 2 (two) times daily as needed for anxiety. TAKE 1 TABLET(0.5 MG) BY MOUTH TWICE DAILY 60 tablet 2   dapagliflozin propanediol (FARXIGA) 10 MG TABS tablet Take 1 tablet (10 mg total) by mouth daily. 90 tablet 1   DULoxetine (CYMBALTA) 60 MG capsule Take 1 capsule (60 mg total) by mouth daily. 60 capsule 2   fluticasone (FLONASE) 50 MCG/ACT nasal spray INSTILL 1 SPRAY INTO EACH NOSTRIL TWICE DAILY AFTER SINUS RINSES. PLEASE SCHEDULE APPOINTMENT FOR FURTHER REFILLS 48 g 2   furosemide (LASIX) 40 MG tablet AS NEEDED for weight increase of 3 lbs overnight or 5 lbs in 1 week 90 tablet 3   glipiZIDE (GLUCOTROL) 5 MG tablet Take 1 tablet (5 mg total) by mouth 2 (two) times daily before a meal. 60 tablet 3   ibuprofen (ADVIL) 200 MG tablet Take 200  mg by mouth every 6 (six) hours as needed.     Insulin Pen Needle (BD PEN NEEDLE NANO 2ND GEN) 32G X 4 MM MISC USE DAILY WITH VICTOZA 100 each 11   isosorbide mononitrate (IMDUR) 30 MG 24 hr tablet TAKE 1 TABLET BY MOUTH EVERY DAY 90 tablet 3   liraglutide (VICTOZA) 18 MG/3ML SOPN Inject 1.8 mg into the skin daily. 27 mL 5   losartan (COZAAR) 25 MG tablet TAKE 1 TABLET(25 MG) BY MOUTH DAILY (Patient taking differently: 12.5 mg.) 90 tablet 1   meloxicam (MOBIC) 15 MG tablet Take 1 tablet (15 mg total) by mouth daily. 90 tablet 0   metoprolol tartrate (LOPRESSOR) 25 MG tablet Take 1 tablet (25 mg total) by mouth daily. 90 tablet 3   nystatin (MYCOSTATIN/NYSTOP) powder Apply 1 application topically 2 (two) times daily. 15 g 3   pantoprazole (PROTONIX) 40 MG tablet Take 1 tablet by  mouth daily.     pregabalin (LYRICA) 150 MG capsule Take 1 capsule (150 mg total) by mouth 2 (two) times daily. 180 capsule 0   No current facility-administered medications for this visit.     Musculoskeletal: Strength & Muscle Tone: na Gait & Station: na Patient leans: N/A  Psychiatric Specialty Exam: Review of Systems  All other systems reviewed and are negative.  Last menstrual period 08/01/2014.There is no height or weight on file to calculate BMI.  General Appearance: NA  Eye Contact:  NA  Speech:  Clear and Coherent  Volume:  Normal  Mood:  Euthymic  Affect:  NA  Thought Process:  Goal Directed  Orientation:  Full (Time, Place, and Person)  Thought Content: Rumination   Suicidal Thoughts:  No  Homicidal Thoughts:  No  Memory:  Immediate;   Good Recent;   Good Remote;   Good  Judgement:  Good  Insight:  Good  Psychomotor Activity:  Normal  Concentration:  Concentration: Good and Attention Span: Good  Recall:  Good  Fund of Knowledge: Good  Language: Good  Akathisia:  No  Handed:  Right  AIMS (if indicated): not done  Assets:  Communication Skills Desire for Improvement Resilience Social  Support Talents/Skills  ADL's:  Intact  Cognition: WNL  Sleep:  Good   Screenings: AIMS    Flowsheet Row Admission (Discharged) from 03/07/2019 in [facility] Admission (Discharged) from 09/12/2014 in [facility] Total Score 0 0      AUDIT    Flowsheet Row Admission (Discharged) from 03/07/2019 in [facility] Admission (Discharged) from 09/12/2014 in [facility]  Alcohol Use Disorder Identification Test Final Score (AUDIT) 0 0      GAD-7    Flowsheet Row Office Visit from 10/20/2020 in [facility] Visit from 11/06/2019 in [facility]  Total GAD-7 Score 9 5      PHQ2-9    Flowsheet Row Video Visit from 02/24/2021 in [facility] Video Visit from 11/24/2020 in [facility] Office Visit from 10/20/2020 in [facility] Office Visit from 11/06/2019 in [facility] from 10/30/2019 in [facility]  PHQ-2 Total Score 0 1 1 0 0  PHQ-9 Total Score -- -- 10 6 --      Flowsheet Row Video Visit from 02/24/2021 in [facility] ED from 01/21/2021 in [facility] DE[facility] Video Visit from 11/24/2020 in [facility] No Risk No Risk No Risk        Assessment and Plan: This patient is a 55 year old female with a history of depression anxiety and ADD.  She continues to do well on her current regimen.  She will continue Cymbalta 60 mg daily for depression, Abilify 5 mg daily for augmentation, clonazepam 0.5 mg twice daily for anxiety or sleep and Adderall 30 mg twice daily for ADD.  She will return to see me in 3 months   [name], MD 02/24/2021,  10:48 AM

## 2021-03-01 LAB — CYTOLOGY - PAP
Adequacy: ABSENT
Diagnosis: NEGATIVE
Diagnosis: REACTIVE

## 2021-03-04 ENCOUNTER — Encounter: Payer: Self-pay | Admitting: *Deleted

## 2021-03-14 ENCOUNTER — Ambulatory Visit (INDEPENDENT_AMBULATORY_CARE_PROVIDER_SITE_OTHER): Payer: Medicare HMO | Admitting: Family Medicine

## 2021-03-14 ENCOUNTER — Encounter: Payer: Self-pay | Admitting: Family Medicine

## 2021-03-14 ENCOUNTER — Other Ambulatory Visit: Payer: Self-pay

## 2021-03-14 VITALS — BP 124/70 | HR 77 | Ht 63.75 in | Wt 276.4 lb

## 2021-03-14 DIAGNOSIS — R69 Illness, unspecified: Secondary | ICD-10-CM | POA: Diagnosis not present

## 2021-03-14 DIAGNOSIS — G4733 Obstructive sleep apnea (adult) (pediatric): Secondary | ICD-10-CM

## 2021-03-14 DIAGNOSIS — I209 Angina pectoris, unspecified: Secondary | ICD-10-CM | POA: Diagnosis not present

## 2021-03-14 DIAGNOSIS — I152 Hypertension secondary to endocrine disorders: Secondary | ICD-10-CM

## 2021-03-14 DIAGNOSIS — Z9989 Dependence on other enabling machines and devices: Secondary | ICD-10-CM

## 2021-03-14 DIAGNOSIS — D72828 Other elevated white blood cell count: Secondary | ICD-10-CM | POA: Diagnosis not present

## 2021-03-14 DIAGNOSIS — F17200 Nicotine dependence, unspecified, uncomplicated: Secondary | ICD-10-CM

## 2021-03-14 DIAGNOSIS — Z23 Encounter for immunization: Secondary | ICD-10-CM | POA: Diagnosis not present

## 2021-03-14 DIAGNOSIS — E782 Mixed hyperlipidemia: Secondary | ICD-10-CM

## 2021-03-14 DIAGNOSIS — F1290 Cannabis use, unspecified, uncomplicated: Secondary | ICD-10-CM | POA: Insufficient documentation

## 2021-03-14 DIAGNOSIS — R42 Dizziness and giddiness: Secondary | ICD-10-CM | POA: Diagnosis not present

## 2021-03-14 DIAGNOSIS — E1169 Type 2 diabetes mellitus with other specified complication: Secondary | ICD-10-CM

## 2021-03-14 DIAGNOSIS — J309 Allergic rhinitis, unspecified: Secondary | ICD-10-CM | POA: Insufficient documentation

## 2021-03-14 DIAGNOSIS — E1159 Type 2 diabetes mellitus with other circulatory complications: Secondary | ICD-10-CM | POA: Diagnosis not present

## 2021-03-14 DIAGNOSIS — F332 Major depressive disorder, recurrent severe without psychotic features: Secondary | ICD-10-CM

## 2021-03-14 LAB — HEMOGLOBIN A1C: Hgb A1c MFr Bld: 8.3 % — ABNORMAL HIGH (ref 4.6–6.5)

## 2021-03-14 LAB — LIPID PANEL
Cholesterol: 127 mg/dL (ref 0–200)
HDL: 38.6 mg/dL — ABNORMAL LOW (ref >39.00)
LDL Cholesterol: 63 mg/dL (ref 0–99)
NonHDL: 88.88
Total CHOL/HDL Ratio: 3
Triglycerides: 128 mg/dL (ref 0.0–149.0)
VLDL: 25.6 mg/dL (ref 0.0–40.0)

## 2021-03-14 LAB — GLUCOSE, RANDOM: Glucose, Bld: 126 mg/dL — ABNORMAL HIGH (ref 70–99)

## 2021-03-14 NOTE — Progress Notes (Signed)
[facility] [address] [place] [address] Dept: [phone] Dept Fax: [phone]  Transfer of Care Office Visit  Subjective:    Patient ID: [name], female    DOB: 1966-01-28, 55 y.o..   MRN: [id number]  Chief Complaint  Patient presents with   Establish Care    [facility]- establish care.  Complaints of dizziness     History of Present Illness:  Patient is in today to establish care. Ms. stonehouse was born in [place]. She lived in [place] during her childhood, but moved back to [place] when she was 15. She has been married for 3 years. Her husband had recent surgery related to a recurrence of laryngeal cancer. He has a stoma and is working at using an [employer] fast streamer. He still has a feeding tube in place. She currently rents a home and has at least one daughter's family living with them. This has created some stressors. Their lease is running out in Feb and the landlord is not planning to renew this. Ms. Leet has two children (ages 29, 64) and two step-children. She is disabled, having previously worked in Clinical biochemist.  Ms. Langwell smokes 1 1/2 ppd of cigarettes. She notes that she has tried to quit multiple times int he past. She had a good response from bupropion, but developed hives after about a week. She did not have any response to varenicline. She has tried patches and vaping in the past, but continues to not be able to quit. She notes it has been hard, as her daughters smoke in the household. She feels that when she and her husband are in a home of their own, she could try using nicotine patches again. Her husband did quit after his cancer diagnosis.  Ms. Berthelot denies alcohol use. She admits to smoking marijuana about twice a week.   Ms. Blyden has a history of COPD. She has been seen in the past by Dr. Sherene Sires (pulmonology). She currently uses an albuterol inhaler. Dr. Sherene Sires has also strongly advised smoking  cessation. Additionally, Ms. Edsall has sleep apnea and uses CPAP.  Ms. Leugers has a history of ADHD and daytime somnolence. She is managed by Dr. Tenny Craw (psychiatry) and is treated with Adderall 30 mg daily. Additionally, Ms. Busker has had severe depression with some mixed anxiety symptoms. She is also treated with Cymbalta, aripiprazole, and clonazepam (used twice in last week).  Ms. Kloehn has a history of hyperlipidemia and is managed on atorvastatin.  Ms. Trotter has a history of Type 2 diabetes. She is managed on dapagliflozin, liraglutide. At her last visit, I added glipizide to her regimen. Her diabetes is complicated by peripheral neuropathy. This is managed with Lyrica.  Ms. Madl has a history of hypertension. She is managed on metoprolol and losartan.  Ms. Rey uses occasional Lasix for swelling of the lower legs (about once a week). She is also on Imdur for angina. She states she had a prior cardiac cath which showed clean cardiac vessels.  Ms. Acheampong complains of chronic dizziness. She states the sensation is similar to being high, but it unrelated ot her use of marijuana.   Past Medical History: Patient Active Problem List   Diagnosis Date Noted   Angina pectoris (HCC) 09/08/2019   Benzodiazepine overdose 03/05/2019   Generalized muscle ache 09/24/2018   Chronic right ear pain 03/11/2018   Chronic sinusitis 03/11/2018   H/O drug abuse (HCC) 11/12/2017   Positive for macroalbuminuria 11/12/2017  Atypical chest pain 10/05/2017   COPD with chronic bronchitis (HCC) 10/01/2017   Mixed diabetic hyperlipidemia associated with type 2 diabetes mellitus (HCC) 10/01/2017   Tobacco use disorder-current smoker greater than 40-pack-year history- since age 17 1 ppd 10/01/2017   Diabetic peripheral neuropathy (HCC) 10/01/2017   Iron deficiency anemia 10/01/2017   Adult attention deficit hyperactivity disorder 01/14/2016   Major depressive disorder, recurrent severe without psychotic  features (HCC)    Leukocytosis 03/25/2014   OSA on CPAP 04/11/2013   Perforated ear drum 05/22/2012   Bell's palsy 01/23/2012   Carpal tunnel syndrome on both sides 01/11/2012   Oxygen dependent 10/16/2011   Vitamin D deficiency 07/06/2011   Asthmatic bronchitis 06/11/2011   Back pain 06/11/2011   Morbid obesity (HCC) 05/06/2011   Gastric erosions 12/05/2010   Hypertension associated with diabetes (HCC) 12/02/2010   Diabetes mellitus, type II (HCC) 12/02/2010   GERD (gastroesophageal reflux disease) 09/08/2010   Esophageal dysphagia 09/08/2010   Past Surgical History:  Procedure Laterality Date   CESAREAN SECTION  1988; 1989   CHOLECYSTECTOMY OPEN  1990   COLONOSCOPY  09/16/2010   FOI:FNGLUC colon/small internal hemorrhoids   ESOPHAGOGASTRODUODENOSCOPY  09/16/2010   SLF: normal/mild gastritis   ESOPHAGOGASTRODUODENOSCOPY N/A 10/19/2014   Procedure: ESOPHAGOGASTRODUODENOSCOPY (EGD);  Surgeon: West Bali, MD;  Location: [facility] SUITE;  Service: Endoscopy;  Laterality: N/A;  830   FRACTURE SURGERY     HYSTEROSCOPY WITH THERMACHOICE  01/17/2012   Procedure: HYSTEROSCOPY WITH THERMACHOICE;  Surgeon: Lazaro Arms, MD;  Location: [facility] ORS;  Service: Gynecology;  Laterality: N/A;  total therapy time: 9:13sec  D5W  18 ml in, D5W   25ml out, temperture 87degrees celcious   KIDNEY SURGERY     as child for blockages   LEFT HEART CATH AND CORONARY ANGIOGRAPHY N/A 09/08/2019   Procedure: LEFT HEART CATH AND CORONARY ANGIOGRAPHY;  Surgeon: Swaziland, Peter M, MD;  Location: [facility] INVASIVE CV LAB;  Service: Cardiovascular;  Laterality: N/A;   TONSILLECTOMY     TUBAL LIGATION  1989   TYMPANOSTOMY TUBE PLACEMENT Bilateral    "several times when I was a child"   uterine ablation     WRIST FRACTURE SURGERY Left 1995   Family History  Problem Relation Age of Onset   Heart attack Mother 25       deceased   Diabetes Mother    Breast cancer Mother 45       inflammatory breast ca   Heart failure  Mother        oxygen dependence, nonsmoker   Heart disease Mother    Depression Mother    Cancer Mother        Breast   Hypertension Mother    Hyperlipidemia Mother    Sudden death Mother    Sleep apnea Mother    Obesity Mother    Heart attack Father 12       deceased, etoh use   Heart disease Father    Alcohol abuse Father    Depression Father    Heart failure Father    Sudden death Father    Ulcers Sister    Hypertension Sister    Heart failure Sister    Depression Sister    Anxiety disorder Sister    Liver disease Maternal Aunt 40       died while on liver transplant list   Liver disease Maternal Uncle    Cancer Paternal Aunt        Breast  Heart disease Maternal Grandmother    Heart attack Maternal Grandmother        premature CAD   Colon cancer Neg Hx    Outpatient Medications Prior to Visit  Medication Sig Dispense Refill   ACCU-CHEK GUIDE test strip USE UP TO FOUR TIMES DAILY AS DIRECTED 200 strip 11   Accu-Chek Softclix Lancets lancets USE UP TO FOUR TIMES DAILY AS DIRECTED 300 each 0   albuterol (VENTOLIN HFA) 108 (90 Base) MCG/ACT inhaler Inhale 2 puffs into the lungs every 6 (six) hours as needed for wheezing or shortness of breath.     amphetamine-dextroamphetamine (ADDERALL) 30 MG tablet Take 1 tablet by mouth every 8 (eight) hours. 90 tablet 0   ARIPiprazole (ABILIFY) 5 MG tablet Take 1 tablet (5 mg total) by mouth daily. 30 tablet 2   atorvastatin (LIPITOR) 20 MG tablet Take 20 mg by mouth 2 (two) times daily.     blood glucose meter kit and supplies Dispense based on patient and insurance preference. Use up to four times daily as directed. (FOR ICD-10 E10.9, E11.9). 1 each 0   clonazePAM (KLONOPIN) 0.5 MG tablet Take 1 tablet (0.5 mg total) by mouth 2 (two) times daily as needed for anxiety. TAKE 1 TABLET(0.5 MG) BY MOUTH TWICE DAILY 60 tablet 2   dapagliflozin propanediol (FARXIGA) 10 MG TABS tablet Take 1 tablet (10 mg total) by mouth daily. 90 tablet 1    DULoxetine (CYMBALTA) 60 MG capsule Take 1 capsule (60 mg total) by mouth daily. 60 capsule 2   fluticasone (FLONASE) 50 MCG/ACT nasal spray INSTILL 1 SPRAY INTO EACH NOSTRIL TWICE DAILY AFTER SINUS RINSES. PLEASE SCHEDULE APPOINTMENT FOR FURTHER REFILLS 48 g 2   furosemide (LASIX) 40 MG tablet AS NEEDED for weight increase of 3 lbs overnight or 5 lbs in 1 week 90 tablet 3   glipiZIDE (GLUCOTROL) 5 MG tablet Take 1 tablet (5 mg total) by mouth 2 (two) times daily before a meal. 60 tablet 3   ibuprofen (ADVIL) 200 MG tablet Take 200 mg by mouth every 6 (six) hours as needed.     Insulin Pen Needle (BD PEN NEEDLE NANO 2ND GEN) 32G X 4 MM MISC USE DAILY WITH VICTOZA 100 each 11   isosorbide mononitrate (IMDUR) 30 MG 24 hr tablet TAKE 1 TABLET BY MOUTH EVERY DAY 90 tablet 3   liraglutide (VICTOZA) 18 MG/3ML SOPN Inject 1.8 mg into the skin daily. 27 mL 5   losartan (COZAAR) 25 MG tablet TAKE 1 TABLET(25 MG) BY MOUTH DAILY (Patient taking differently: 12.5 mg.) 90 tablet 1   meloxicam (MOBIC) 15 MG tablet Take 1 tablet (15 mg total) by mouth daily. 90 tablet 0   metoprolol tartrate (LOPRESSOR) 25 MG tablet Take 1 tablet (25 mg total) by mouth daily. 90 tablet 3   nystatin (MYCOSTATIN/NYSTOP) powder Apply 1 application topically 2 (two) times daily. 15 g 3   pantoprazole (PROTONIX) 40 MG tablet Take 1 tablet by mouth daily.     pregabalin (LYRICA) 150 MG capsule Take 1 capsule (150 mg total) by mouth 2 (two) times daily. 180 capsule 0   amphetamine-dextroamphetamine (ADDERALL) 30 MG tablet Take 1 tablet by mouth 2 (two) times daily. 60 tablet 0   amphetamine-dextroamphetamine (ADDERALL) 30 MG tablet Take 1 tablet by mouth 2 (two) times daily. 60 tablet 0   No facility-administered medications prior to visit.   Allergies  Allergen Reactions   Codeine Itching and Nausea Only   Metformin And Related  Diarrhea and Other (See Comments)    Stomach pain/nausea   Wellbutrin [Bupropion Hcl] Hives    Acyclovir And Related Rash     Objective:   Today's Vitals   03/14/21 1015  BP: 124/70  Pulse: 77  SpO2: 95%  Weight: 276 lb 6.4 oz (125.4 kg)  Height: 5' 3.75" (1.619 m)   Body mass index is 47.82 kg/m.   General: Well developed, well nourished. No acute distress. Feet- Skin intact, but mild dryness. Pulses 1+. 5.07 monofilament is generally insensate. Psych: Alert and oriented. Normal mood and affect.  Health Maintenance Due  Topic Date Due   Hepatitis C Screening  Never done   Zoster Vaccines- Shingrix (1 of 2) Never done   Pneumococcal Vaccine 79-65 Years old (2 - PCV) 02/19/2013   FOOT EXAM  01/11/2019   COVID-19 Vaccine (3 - Pfizer risk series) 12/09/2019   OPHTHALMOLOGY EXAM  07/03/2020   COLONOSCOPY (Pts 45-1yrs Insurance coverage will need to be confirmed)  08/22/2020     Imaging: Echocardiogram (10/21/2019) IMPRESSIONS   1. Left ventricular ejection fraction, by estimation, is 60 to 65%. The left ventricle has normal function. The left ventricle has no regional wall motion abnormalities. Left ventricular diastolic parameters were normal.   2. Right ventricular systolic function is normal. The right ventricular size is normal. There is normal pulmonary artery systolic pressure.   3. The mitral valve is normal in structure. Trivial mitral valve regurgitation. No evidence of mitral stenosis.   4. The aortic valve is grossly normal. Aortic valve regurgitation is not visualized. No aortic stenosis is present.   5. The inferior vena cava is normal in size with greater than 50% respiratory variability, suggesting right atrial pressure of 3 mmHg.   PFTs (10/17/2019) Pulmonary Function Test   Pre    Post FVC:  2.91(83 %pred)  2.89(0 %chng) FEV1:  2.31(84 %pred)  2.41(4 %chng) FEV1/FVC 80 %pred   83 %pred  DLCOunc 16.91(81 %pred) DLCOcor 19.71(80 %pred)  Interpretation: The FVC, FEV1, FEV1/FVC ratio and FEF25-75% are within normal limits, but there is curvature to the  flow volume loop suggesting minimal small airway disease. . Lung volumes are within normal limits. Following administration of bronchodilators, there is no significant response. The diffusing capacity is normal.   Assessment & Plan:   1. Type 2 diabetes mellitus with other specified complication, without long-term current use of insulin [facility]) Ms. Rizo has had uncontrolled Type 2 diabetes. We added glipizide at her last visit. We will recheck her A1c today.  - Glucose, random - Hemoglobin A1c  2. Hypertension associated with diabetes ([place]) Blood pressure at goal.   3. Angina pectoris (HCC) Stable on Imdur. No appreciable chest pain.  4. OSA on CPAP Continue sue of CPAP  5. Tobacco use disorder-current smoker greater than 40-pack-year history- since age 31 1 ppd I strongly advised smoking cessation. I offered assistance when she is ready to try again, which may be after Feb., when she and her husband are in a home on her own. We could try nicotine patches again at that point.  6. Other elevated white blood cell (WBC) count Was seen by hematology. Felt to be multifactorial related to smoking and obesity.  7. Major depressive disorder, recurrent severe without psychotic features [facility]) She will continue to follow with Dr. Harrington Challenger for [facility] meds.  8. Mixed diabetic hyperlipidemia associated with type 2 diabetes mellitus (HCC) Last LDL was above goal. We will reassess and adjust her atorvastatin if needed. -  Lipid panel  9. Dizziness The etiology of her dizziness is unclear, She is on multiple medications that could contribute, including psychotherapeutics, antihypertensives, and an SGLT2 inhibitor. I will complete assessment of her current diabetes status and consider what changes we might make.  10. Need for influenza vaccination  - Flu Vaccine QUAD 6+ mos PF IM (Fluarix Quad PF)  Haydee Salter, MD

## 2021-03-15 DIAGNOSIS — G4733 Obstructive sleep apnea (adult) (pediatric): Secondary | ICD-10-CM | POA: Diagnosis not present

## 2021-03-25 ENCOUNTER — Other Ambulatory Visit: Payer: Self-pay | Admitting: Cardiology

## 2021-03-25 ENCOUNTER — Other Ambulatory Visit: Payer: Self-pay | Admitting: Family Medicine

## 2021-03-25 DIAGNOSIS — E1165 Type 2 diabetes mellitus with hyperglycemia: Secondary | ICD-10-CM

## 2021-03-25 NOTE — Telephone Encounter (Signed)
This is Dr. Schumann's pt 

## 2021-03-26 DIAGNOSIS — G4733 Obstructive sleep apnea (adult) (pediatric): Secondary | ICD-10-CM | POA: Diagnosis not present

## 2021-03-31 ENCOUNTER — Telehealth (HOSPITAL_COMMUNITY): Payer: Self-pay | Admitting: *Deleted

## 2021-03-31 NOTE — Telephone Encounter (Signed)
Patient called stating she is having a hard time getting her Adderall 30 mg. Per pt she called [facility] and [facility] to see if they have in stock and they do not have it.   Per pt she called [facility] on [address] and they informed her that they currently have it in stock but due to the shortage they may run out. Patient would like for provider to please send in her Adderall to that pharmacy.   Patient number is [phone]

## 2021-04-01 ENCOUNTER — Other Ambulatory Visit (HOSPITAL_COMMUNITY): Payer: Self-pay | Admitting: Psychiatry

## 2021-04-01 MED ORDER — AMPHETAMINE-DEXTROAMPHETAMINE 30 MG PO TABS: 30.0000 mg | ORAL_TABLET | Freq: Two times a day (BID) | ORAL | 0 refills | Status: DC

## 2021-04-01 NOTE — Telephone Encounter (Signed)
sent 

## 2021-04-01 NOTE — Telephone Encounter (Signed)
Spoke with patient and informed her with information and she stated she will call [facility] to see if they received it

## 2021-04-05 ENCOUNTER — Other Ambulatory Visit: Payer: Self-pay | Admitting: Physician Assistant

## 2021-04-05 ENCOUNTER — Other Ambulatory Visit: Payer: Self-pay | Admitting: Family Medicine

## 2021-04-05 DIAGNOSIS — M5441 Lumbago with sciatica, right side: Secondary | ICD-10-CM

## 2021-04-05 DIAGNOSIS — M25511 Pain in right shoulder: Secondary | ICD-10-CM

## 2021-04-05 DIAGNOSIS — G8929 Other chronic pain: Secondary | ICD-10-CM

## 2021-04-14 DIAGNOSIS — G4733 Obstructive sleep apnea (adult) (pediatric): Secondary | ICD-10-CM | POA: Diagnosis not present

## 2021-04-19 ENCOUNTER — Other Ambulatory Visit (HOSPITAL_COMMUNITY): Payer: Self-pay | Admitting: Psychiatry

## 2021-05-02 ENCOUNTER — Other Ambulatory Visit (HOSPITAL_COMMUNITY): Payer: Self-pay | Admitting: Psychiatry

## 2021-05-02 ENCOUNTER — Telehealth (HOSPITAL_COMMUNITY): Payer: Self-pay | Admitting: *Deleted

## 2021-05-02 MED ORDER — AMPHETAMINE-DEXTROAMPHETAMINE 30 MG PO TABS: 30.0000 mg | ORAL_TABLET | Freq: Two times a day (BID) | ORAL | 0 refills | Status: DC

## 2021-05-02 NOTE — Telephone Encounter (Signed)
Patient called stating she have been trying to find her Adderall at different pharmacy. The only pharmacy that currently have Adderall that she know of is [facility] on [address], [place]

## 2021-05-02 NOTE — Telephone Encounter (Signed)
sent 

## 2021-05-03 NOTE — Telephone Encounter (Signed)
Spoke with patient and she is aware and she picked up script already

## 2021-05-09 ENCOUNTER — Other Ambulatory Visit: Payer: Self-pay | Admitting: Physician Assistant

## 2021-05-09 ENCOUNTER — Other Ambulatory Visit: Payer: Self-pay

## 2021-05-09 ENCOUNTER — Telehealth: Payer: Self-pay | Admitting: Family Medicine

## 2021-05-09 DIAGNOSIS — E1143 Type 2 diabetes mellitus with diabetic autonomic (poly)neuropathy: Secondary | ICD-10-CM

## 2021-05-09 DIAGNOSIS — E1159 Type 2 diabetes mellitus with other circulatory complications: Secondary | ICD-10-CM

## 2021-05-09 NOTE — Telephone Encounter (Signed)
Left message for patient to call back and schedule Medicare Annual Wellness Visit (AWV) in office.   If not able to come in office, please offer to do virtually or by telephone.  Left office number and my jabber [phone].  Last AWV:10/30/2019  Please schedule at anytime with Nurse Health Advisor.

## 2021-05-13 ENCOUNTER — Ambulatory Visit (INDEPENDENT_AMBULATORY_CARE_PROVIDER_SITE_OTHER): Payer: Medicare HMO

## 2021-05-13 ENCOUNTER — Ambulatory Visit
Admission: RE | Admit: 2021-05-13 | Discharge: 2021-05-13 | Disposition: A | Discharge status: Home / Self Care | Payer: Medicare HMO | Source: Ambulatory Visit | Attending: Physician Assistant | Admitting: Physician Assistant

## 2021-05-13 ENCOUNTER — Other Ambulatory Visit: Payer: Self-pay

## 2021-05-13 VITALS — BP 111/79 | HR 91 | Temp 98.4°F | Resp 18

## 2021-05-13 DIAGNOSIS — R059 Cough, unspecified: Secondary | ICD-10-CM | POA: Diagnosis not present

## 2021-05-13 DIAGNOSIS — J441 Chronic obstructive pulmonary disease with (acute) exacerbation: Secondary | ICD-10-CM | POA: Diagnosis not present

## 2021-05-13 MED ORDER — IPRATROPIUM-ALBUTEROL 0.5-2.5 (3) MG/3ML IN SOLN
3.0000 mL | Freq: Once | RESPIRATORY_TRACT | Status: AC
  Administered 2021-05-13: 3 mL via RESPIRATORY_TRACT

## 2021-05-13 MED ORDER — AZITHROMYCIN 250 MG PO TABS: 250.0000 mg | ORAL_TABLET | Freq: Every day | ORAL | 0 refills | Status: DC

## 2021-05-13 MED ORDER — PREDNISONE 20 MG PO TABS: 40.0000 mg | ORAL_TABLET | Freq: Every day | ORAL | 0 refills | Status: AC

## 2021-05-13 MED ORDER — ALBUTEROL SULFATE (2.5 MG/3ML) 0.083% IN NEBU: 2.5000 mg | INHALATION_SOLUTION | Freq: Four times a day (QID) | RESPIRATORY_TRACT | 0 refills | Status: DC | PRN

## 2021-05-13 NOTE — ED Triage Notes (Signed)
5 day h/o cough, congestion, fatigue, HA and body aches. Has been taking otc meds and inhaler w/o relief.

## 2021-05-13 NOTE — ED Provider Notes (Signed)
[facility]    CSN: [id number] Arrival date & time: 05/13/21  1052      History   Chief Complaint Chief Complaint  Patient presents with   11a appt   Cough    HPI [name] is a 56 y.o. female.   Patient here today for evaluation of 5-day history of cough, congestion, fatigue, headaches and body aches.  She has been using her over-the-counter medication inhaler without significant relief.  She does have history of asthma and COPD.  The history is provided by the patient.  Cough Associated symptoms: headaches, shortness of breath and wheezing   Associated symptoms: no chills, no ear pain, no eye discharge, no fever and no sore throat    Past Medical History:  Diagnosis Date   ADHD    Alcohol abuse    Anemia    Anxiety    Asthmatic bronchitis    normal PFT/ seen by pulmonary no evidence of COPD   Atypical chest pain 10/05/2017   Back pain    Bilateral swelling of feet    Chest pain    Chest pain on respiration 03/25/2014   Chronic bronchitis (HCC)    Chronic lower back pain    Chronic respiratory failure ([place]) 10/16/2011   Newly 02 dep 24/7 p discharge from [facility] 01/2013  - 03/13/2014  Walked RA  2 laps @ 185 ft each stopped due to  Sob/ aching in legs, thirsty/ no desat @ slow pace    Chronic respiratory failure with hypoxia (HCC)    On 2-3 L of oxygen at home   Cigarette smoker 12/02/2010   Followed in Pulmonary clinic/ [facility]/ [place]   - Limits of effective care reviewed 2/40/9735     Complication of anesthesia    Constipation    COPD (chronic obstructive pulmonary disease) ([place])    COPD with chronic bronchitis ([place]) 10/01/2017   Daily headache    Depression    Diabetic peripheral neuropathy ([place])    DM (diabetes mellitus) type II controlled, neurological manifestation ([place]) 12/02/2010   Drug use    Gallbladder problem    Gastric erosions    EGD 08/2010.   GERD (gastroesophageal reflux disease)    Glaucoma    H/O drug abuse  ([place]) 11/12/2017   -- scanned document from outside source: [facility] in [place] which showed UDS positive for Adderall /amphetamine usage as well as positive urine for THC.  This test result was collected 08/11/2016 and reported 10/07/2016. - lso review of the chart shows another positive amphetamine, THC and METH in the urine back on 10/18/2015 under "care everywhere". ---So    Heavy menses    High cholesterol    History of blood transfusion    "related to low HgB" (10/05/2017)   History of hiatal hernia    HTN (hypertension)    Hyperlipidemia associated with type 2 diabetes mellitus ([place]) 10/18/2015   Hypertension associated with diabetes ([place]) 12/02/2010   D/c acei 12/22/2011 due to psuedowheeze and narcotic dependent cough> ? Improved - 32/99/2426 started bystolic in place of cozar due to cough     IBS (irritable bowel syndrome)    Increased urinary protein excretion    Internal hemorrhoids    Colonoscopy 5/12.   Iron deficiency anemia 10/01/2017   Joint pain    Mixed diabetic hyperlipidemia associated with type 2 diabetes mellitus ([place]) 10/01/2017   On home oxygen therapy    "5L at night" (  10/05/2017)   OSA on CPAP    Osteoarthritis    "back" (10/05/2017)   Oxygen dependent 10/16/2011   Pneumonia    "lots of times" (10/05/2017)   PONV (postoperative nausea and vomiting)    Poorly controlled diabetes mellitus ([place]) 11/12/2017   Pulmonary infiltrates 12/22/2011   Followed in Pulmonary clinic/ [facility]/ [place]    - See CT Chest 05/05/11    Shortness of breath    Tachycardia    never had test done since no insurance   Tobacco use disorder-current smoker greater than 40-pack-year history- since age 49 1 ppd 10/01/2017   Type II diabetes mellitus ([place])    Vitamin D deficiency     Patient Active Problem List   Diagnosis Date Noted   Marijuana use 03/14/2021   Allergic rhinitis 03/14/2021   Angina pectoris ([place]) 09/08/2019   Benzodiazepine  overdose 03/05/2019   Generalized muscle ache 09/24/2018   Chronic right ear pain 03/11/2018   Chronic sinusitis 03/11/2018   H/O drug abuse ([place]) 11/12/2017   Positive for macroalbuminuria 11/12/2017   Atypical chest pain 10/05/2017   COPD with chronic bronchitis ([place]) 10/01/2017   Mixed diabetic hyperlipidemia associated with type 2 diabetes mellitus ([place]) 10/01/2017   Tobacco use disorder-current smoker greater than 40-pack-year history- since age 17 1 ppd 10/01/2017   Diabetic peripheral neuropathy ([place]) 10/01/2017   Iron deficiency anemia 10/01/2017   Adult attention deficit hyperactivity disorder 01/14/2016   Major depressive disorder, recurrent severe without psychotic features ([place])    Leukocytosis 03/25/2014   OSA on CPAP 04/11/2013   Perforated ear drum 05/22/2012   Bell's palsy 01/23/2012   Carpal tunnel syndrome on both sides 01/11/2012   Oxygen dependent 10/16/2011   Vitamin D deficiency 07/06/2011   Asthmatic bronchitis 06/11/2011   Back pain 06/11/2011   Morbid obesity ([place]) 05/06/2011   Gastric erosions 12/05/2010   Hypertension associated with diabetes ([place]) 12/02/2010   Diabetes mellitus, type II ([place]) 12/02/2010   GERD (gastroesophageal reflux disease) 09/08/2010   Esophageal dysphagia 09/08/2010    Past Surgical History:  Procedure Laterality Date   CESAREAN SECTION  1988; [place]   COLONOSCOPY  09/16/2010   HCW:CBJSEG colon/small internal hemorrhoids   ESOPHAGOGASTRODUODENOSCOPY  09/16/2010   SLF: normal/mild gastritis   ESOPHAGOGASTRODUODENOSCOPY N/A 10/19/2014   Procedure: ESOPHAGOGASTRODUODENOSCOPY (EGD);  Surgeon: Danie Binder, MD;  Location: [facility];  Service: Endoscopy;  Laterality: N/A;  [place]  01/17/2012   Procedure: HYSTEROSCOPY WITH THERMACHOICE;  Surgeon: Florian Buff, MD;  Location: [facility];  Service: Gynecology;  Laterality: N/A;  total therapy time: 9:13sec   D5W  18 ml in, D5W   94m out, temperture 87degrees celcious   KIDNEY SURGERY     as child for blockages   LEFT HEART CATH AND CORONARY ANGIOGRAPHY N/A 09/08/2019   Procedure: LEFT HEART CATH AND CORONARY ANGIOGRAPHY;  Surgeon: JMartinique Peter M, MD;  [facility];  Service: Cardiovascular;  Laterality: N/A;   TONSILLECTOMY     TUBAL LIGATION  1989   TYMPANOSTOMY TUBE PLACEMENT Bilateral    "several times when I was a child"   uterine ablation     WRIST FRACTURE SURGERY Left 1995    OB History     Gravida  2   Para  2   Term  2   Preterm      AB  Living  2      SAB      IAB      Ectopic      Multiple      Live Births               Home Medications    Prior to Admission medications   Medication Sig Start Date End Date Taking? Authorizing Provider  albuterol (PROVENTIL) (2.5 MG/3ML) 0.083% nebulizer solution Take 3 mLs (2.5 mg total) by nebulization every 6 (six) hours as needed for wheezing or shortness of breath. 05/13/21  Yes Francene Finders, PA-C  azithromycin (ZITHROMAX) 250 MG tablet Take 1 tablet (250 mg total) by mouth daily. Take first 2 tablets together, then 1 every day until finished. 05/13/21  Yes Francene Finders, PA-C  predniSONE (DELTASONE) 20 MG tablet Take 2 tablets (40 mg total) by mouth daily with breakfast for 5 days. 05/13/21 05/18/21 Yes Francene Finders, PA-C  ACCU-CHEK GUIDE test strip USE UP TO FOUR TIMES DAILY AS DIRECTED 08/18/19   Cirigliano, Garvin Fila, DO  Accu-Chek Softclix Lancets lancets USE UP TO FOUR TIMES DAILY AS DIRECTED 06/20/19   Cirigliano, Garvin Fila, DO  albuterol (VENTOLIN HFA) 108 (90 Base) MCG/ACT inhaler Inhale 2 puffs into the lungs every 6 (six) hours as needed for wheezing or shortness of breath.    [provider]  amphetamine-dextroamphetamine (ADDERALL) 30 MG tablet Take 1 tablet by mouth 2 (two) times daily. 02/24/21 02/24/22  Cloria Spring, MD  amphetamine-dextroamphetamine (ADDERALL) 30 MG  tablet Take 1 tablet by mouth 2 (two) times daily. 02/24/21 02/24/22  Cloria Spring, MD  amphetamine-dextroamphetamine (ADDERALL) 30 MG tablet Take 1 tablet by mouth 2 (two) times daily. 05/02/21 05/02/22  Cloria Spring, MD  ARIPiprazole (ABILIFY) 5 MG tablet Take 1 tablet (5 mg total) by mouth daily. 02/24/21 02/24/22  Cloria Spring, MD  atorvastatin (LIPITOR) 20 MG tablet Take 20 mg by mouth 2 (two) times daily. 01/03/21   [provider]  blood glucose meter kit and supplies Dispense based on patient and insurance preference. Use up to four times daily as directed. (FOR ICD-10 E10.9, E11.9). 06/20/19   Cirigliano, Garvin Fila, DO  clonazePAM (KLONOPIN) 0.5 MG tablet Take 1 tablet (0.5 mg total) by mouth 2 (two) times daily as needed for anxiety. TAKE 1 TABLET(0.5 MG) BY MOUTH TWICE DAILY 02/24/21   Cloria Spring, MD  dapagliflozin propanediol (FARXIGA) 10 MG TABS tablet Take 1 tablet (10 mg total) by mouth daily. 01/10/21   Libby Maw, MD  DULoxetine (CYMBALTA) 60 MG capsule TAKE 1 CAPSULE(60 MG) BY MOUTH DAILY 04/25/21   Cloria Spring, MD  fluticasone (FLONASE) 50 MCG/ACT nasal spray INSTILL 1 SPRAY INTO EACH NOSTRIL TWICE DAILY AFTER SINUS RINSES. PLEASE SCHEDULE APPOINTMENT FOR FURTHER REFILLS 05/11/20   Ronnald Nian, DO  furosemide (LASIX) 40 MG tablet AS NEEDED for weight increase of 3 lbs overnight or 5 lbs in 1 week 10/29/19   Donato Heinz, MD  glipiZIDE (GLUCOTROL) 5 MG tablet TAKE 1 TABLET(5 MG) BY MOUTH TWICE DAILY BEFORE A MEAL 03/25/21   Haydee Salter, MD  ibuprofen (ADVIL) 200 MG tablet Take 200 mg by mouth every 6 (six) hours as needed.    [provider]  Insulin Pen Needle (BD PEN NEEDLE NANO 2ND GEN) 32G X 4 MM MISC USE DAILY WITH VICTOZA 06/25/20   Cirigliano, Mary K, DO  isosorbide mononitrate (IMDUR) 30 MG 24 hr  tablet TAKE 1 TABLET(30 MG) BY MOUTH DAILY. MAKE APPOINTMENT WITH PROVIDER BEFORE FURTHER REFILLS- FIRST ATTEMPT 05/10/21   Belva Crome, MD  liraglutide (VICTOZA) 18 MG/3ML SOPN Inject 1.8 mg into the skin daily. 10/20/20   Cirigliano, Garvin Fila, DO  losartan (COZAAR) 25 MG tablet TAKE 1 TABLET(25 MG) BY MOUTH DAILY Patient taking differently: 12.5 mg. 11/18/20   Ronnald Nian, DO  meloxicam (MOBIC) 15 MG tablet TAKE 1 TABLET(15 MG) BY MOUTH DAILY 04/05/21   Libby Maw, MD  metoprolol tartrate (LOPRESSOR) 25 MG tablet TAKE 1 TABLET(25 MG) BY MOUTH TWICE DAILY 03/25/21   Donato Heinz, MD  nystatin (MYCOSTATIN/NYSTOP) powder Apply 1 application topically 2 (two) times daily. 11/19/20   [name], MD  pantoprazole (PROTONIX) 40 MG tablet Take 1 tablet by mouth daily. 10/25/16   [provider]  pregabalin (LYRICA) 150 MG capsule Take 1 capsule (150 mg total) by mouth 2 (two) times daily. 01/05/21   Haydee Salter, MD    Family History Family History  Problem Relation Age of Onset   Heart attack Mother 33       deceased   Diabetes Mother    Breast cancer Mother 84       inflammatory breast ca   Heart failure Mother        oxygen dependence, nonsmoker   Heart disease Mother    Depression Mother    Cancer Mother        Breast   Hypertension Mother    Hyperlipidemia Mother    Sudden death Mother    Sleep apnea Mother    Obesity Mother    Heart attack Father 98       deceased, etoh use   Heart disease Father    Alcohol abuse Father    Depression Father    Heart failure Father    Sudden death Father    Ulcers Sister    Hypertension Sister    Heart failure Sister    Depression Sister    Anxiety disorder Sister    Liver disease Maternal Aunt 86       died while on liver transplant list   Liver disease Maternal Uncle    Cancer Paternal Aunt        Breast   Heart disease Maternal Grandmother    Heart attack Maternal Grandmother        premature CAD   Colon cancer Neg Hx     Social History Social History   Tobacco Use   Smoking status: Every Day    Packs/day: 1.50     Years: 40.00    Pack years: 60.00    Types: Cigarettes, E-cigarettes    Start date: 1980   Smokeless tobacco: Current   Tobacco comments:    vape  Scientific laboratory technician Use: Every day  Substance Use Topics   Alcohol use: Not Currently   Drug use: Yes    Types: Marijuana    Comment: occ     Allergies   Codeine, Metformin and related, Wellbutrin [bupropion hcl], and Acyclovir and related   Review of Systems Review of Systems  Constitutional:  Negative for chills and fever.  HENT:  Positive for congestion. Negative for ear pain and sore throat.   Eyes:  Negative for discharge and redness.  Respiratory:  Positive for cough, shortness of breath and wheezing.   Gastrointestinal:  Negative for abdominal pain, diarrhea, nausea and vomiting.  Neurological:  Positive for  headaches.    Physical Exam Triage Vital Signs ED Triage Vitals  Enc Vitals Group     BP 05/13/21 1141 (!) 194/117     Pulse Rate 05/13/21 1141 (!) 115     Resp 05/13/21 1141 16     Temp 05/13/21 1141 98.4 F (36.9 C)     Temp Source 05/13/21 1141 Oral     SpO2 05/13/21 1141 93 %     Weight --      Height --      Head Circumference --      Peak Flow --      Pain Score 05/13/21 1146 7     Pain Loc --      Pain Edu? --      Excl. in [place]? --    No data found.  Updated Vital Signs BP 111/79 (BP Location: Left Arm) Comment: rechecked requested by provider   Pulse 91    Temp 98.4 F (36.9 C) (Oral)    Resp 18    LMP 08/01/2014 Comment: irregular   SpO2 94%       Physical Exam Vitals and nursing note reviewed.  Constitutional:      General: She is not in acute distress.    Appearance: Normal appearance. She is not ill-appearing.  HENT:     Head: Normocephalic and atraumatic.     Nose: Congestion present.     Mouth/Throat:     Mouth: Mucous membranes are moist.     Pharynx: No oropharyngeal exudate or posterior oropharyngeal erythema.  Eyes:     Conjunctiva/sclera: Conjunctivae normal.   Cardiovascular:     Rate and Rhythm: Normal rate and regular rhythm.     Heart sounds: Normal heart sounds. No murmur heard. Pulmonary:     Effort: Pulmonary effort is normal. No respiratory distress.     Breath sounds: Wheezing (diffuse) and rhonchi present. No rales.     Comments: Shob improved after duoneb Skin:    General: Skin is warm and dry.  Neurological:     Mental Status: She is alert.  Psychiatric:        Mood and Affect: Mood normal.        Thought Content: Thought content normal.     UC Treatments / Results  Labs (all labs ordered are listed, but only abnormal results are displayed) Labs Reviewed  COVID-19, FLU A+B NAA    EKG   Radiology DG Chest 2 View  Result Date: 05/13/2021 CLINICAL DATA:  Cough EXAM: CHEST - 2 VIEW COMPARISON:  02/19/2020 FINDINGS: Cardiac and mediastinal contours are within normal limits. No focal pulmonary opacity. No pleural effusion or pneumothorax. No acute osseous abnormality. IMPRESSION: No acute cardiopulmonary process. Electronically Signed   By: Merilyn Baba M.D.   On: 05/13/2021 12:39    Procedures Procedures (including critical care time)  Medications Ordered in UC Medications  ipratropium-albuterol (DUONEB) 0.5-2.5 (3) MG/3ML nebulizer solution 3 mL (3 mLs Nebulization Given 05/13/21 1213)    Initial Impression / Assessment and Plan / UC Course  I have reviewed the triage vital signs and the nursing notes.  Pertinent labs & imaging results that were available during my care of the patient were reviewed by me and considered in my medical decision making (see chart for details).    CXR without concerning findings. Will treat to cover COPD exacerbation. Encouraged follow up if no gradual improvement and recommended she be evaluated in the ED with any worsening.   Final Clinical Impressions(s) /  UC Diagnoses   Final diagnoses:  COPD exacerbation [facility])   Discharge Instructions   None    ED Prescriptions      Medication Sig Dispense Auth. Provider   predniSONE (DELTASONE) 20 MG tablet Take 2 tablets (40 mg total) by mouth daily with breakfast for 5 days. 10 tablet Ewell Poe F, PA-C   azithromycin (ZITHROMAX) 250 MG tablet Take 1 tablet (250 mg total) by mouth daily. Take first 2 tablets together, then 1 every day until finished. 6 tablet Ewell Poe F, PA-C   albuterol (PROVENTIL) (2.5 MG/3ML) 0.083% nebulizer solution Take 3 mLs (2.5 mg total) by nebulization every 6 (six) hours as needed for wheezing or shortness of breath. 75 mL Francene Finders, PA-C      PDMP not reviewed this encounter.   Francene Finders, PA-C 05/13/21 1944

## 2021-05-14 LAB — COVID-19, FLU A+B NAA
Influenza A, NAA: NOT DETECTED
Influenza B, NAA: NOT DETECTED
SARS-CoV-2, NAA: NOT DETECTED

## 2021-05-31 ENCOUNTER — Encounter (HOSPITAL_COMMUNITY): Payer: Self-pay | Admitting: Emergency Medicine

## 2021-05-31 ENCOUNTER — Emergency Department (HOSPITAL_COMMUNITY)
Admission: EM | Admit: 2021-05-31 | Discharge: 2021-06-01 | Disposition: A | Discharge status: Home / Self Care | Payer: Medicare HMO | Source: Home / Self Care

## 2021-05-31 ENCOUNTER — Emergency Department (HOSPITAL_COMMUNITY): Payer: Medicare HMO

## 2021-05-31 DIAGNOSIS — J9811 Atelectasis: Secondary | ICD-10-CM | POA: Diagnosis not present

## 2021-05-31 DIAGNOSIS — J441 Chronic obstructive pulmonary disease with (acute) exacerbation: Secondary | ICD-10-CM | POA: Diagnosis not present

## 2021-05-31 DIAGNOSIS — Z5321 Procedure and treatment not carried out due to patient leaving prior to being seen by health care provider: Secondary | ICD-10-CM | POA: Insufficient documentation

## 2021-05-31 DIAGNOSIS — Z20822 Contact with and (suspected) exposure to covid-19: Secondary | ICD-10-CM | POA: Insufficient documentation

## 2021-05-31 DIAGNOSIS — R059 Cough, unspecified: Secondary | ICD-10-CM | POA: Diagnosis not present

## 2021-05-31 DIAGNOSIS — J101 Influenza due to other identified influenza virus with other respiratory manifestations: Secondary | ICD-10-CM | POA: Insufficient documentation

## 2021-05-31 DIAGNOSIS — R079 Chest pain, unspecified: Secondary | ICD-10-CM | POA: Diagnosis not present

## 2021-05-31 DIAGNOSIS — R0602 Shortness of breath: Secondary | ICD-10-CM | POA: Diagnosis not present

## 2021-05-31 LAB — CBC WITH DIFFERENTIAL/PLATELET
Abs Immature Granulocytes: 0.03 10*3/uL (ref 0.00–0.07)
Basophils Absolute: 0 10*3/uL (ref 0.0–0.1)
Basophils Relative: 0 %
Eosinophils Absolute: 0.1 10*3/uL (ref 0.0–0.5)
Eosinophils Relative: 1 %
HCT: 41.9 % (ref 36.0–46.0)
Hemoglobin: 13 g/dL (ref 12.0–15.0)
Immature Granulocytes: 0 %
Lymphocytes Relative: 18 %
Lymphs Abs: 1.8 10*3/uL (ref 0.7–4.0)
MCH: 25.3 pg — ABNORMAL LOW (ref 26.0–34.0)
MCHC: 31 g/dL (ref 30.0–36.0)
MCV: 81.5 fL (ref 80.0–100.0)
Monocytes Absolute: 0.6 10*3/uL (ref 0.1–1.0)
Monocytes Relative: 5 %
Neutro Abs: 8 10*3/uL — ABNORMAL HIGH (ref 1.7–7.7)
Neutrophils Relative %: 76 %
Platelets: 227 10*3/uL (ref 150–400)
RBC: 5.14 MIL/uL — ABNORMAL HIGH (ref 3.87–5.11)
RDW: 17.8 % — ABNORMAL HIGH (ref 11.5–15.5)
WBC: 10.5 10*3/uL (ref 4.0–10.5)
nRBC: 0 % (ref 0.0–0.2)

## 2021-05-31 LAB — COMPREHENSIVE METABOLIC PANEL
ALT: 18 U/L (ref 0–44)
AST: 18 U/L (ref 15–41)
Albumin: 3.3 g/dL — ABNORMAL LOW (ref 3.5–5.0)
Alkaline Phosphatase: 59 U/L (ref 38–126)
Anion gap: 10 (ref 5–15)
BUN: 5 mg/dL — ABNORMAL LOW (ref 6–20)
CO2: 25 mmol/L (ref 22–32)
Calcium: 9 mg/dL (ref 8.9–10.3)
Chloride: 104 mmol/L (ref 98–111)
Creatinine, Ser: 0.81 mg/dL (ref 0.44–1.00)
GFR, Estimated: >60 mL/min (ref >60)
Glucose, Bld: 213 mg/dL — ABNORMAL HIGH (ref 70–99)
Potassium: 3.5 mmol/L (ref 3.5–5.1)
Sodium: 139 mmol/L (ref 135–145)
Total Bilirubin: 0.1 mg/dL — ABNORMAL LOW (ref 0.3–1.2)
Total Protein: 6.7 g/dL (ref 6.5–8.1)

## 2021-05-31 LAB — RESP PANEL BY RT-PCR (FLU A&B, COVID) ARPGX2
Influenza A by PCR: NEGATIVE
Influenza B by PCR: NEGATIVE
SARS Coronavirus 2 by RT PCR: NEGATIVE

## 2021-05-31 MED ORDER — ALBUTEROL SULFATE HFA 108 (90 BASE) MCG/ACT IN AERS: 4.0000 | INHALATION_SPRAY | Freq: Once | RESPIRATORY_TRACT | Status: DC

## 2021-05-31 NOTE — ED Provider Triage Note (Signed)
Emergency Medicine Provider Triage Evaluation Note  [name] , a 56 y.o. female  was evaluated in triage.  Pt complains of productive cough and shortness of breath.  Reports that this has been present since the end of January.  Patient was diagnosed with COPD exacerbation and started on prednisone and azithromycin.  Patient reports that she had improvement in symptoms after taking this medication however symptoms have gotten gradually worse since then.  Patient reports that cough is productive, producing thick white mucus.  Patient states that amount of sputum has not increased in color has not changed.  Patient states that consistency has become thicker.  Patient reports that shortness of breath is worse when coughing.  Patient reports that she has been taking albuterol inhaler and Delsym with some improvement in her symptoms.  Patient reports that she is not on any home oxygen.  Review of Systems  Positive: Shortness of breath, productive cough Negative: Fever, chills, chest pain, leg swelling or tenderness  Physical Exam  BP 114/61 (BP Location: Right Arm)    Pulse 100    Temp 98.4 F (36.9 C) (Oral)    Resp (!) 32    LMP 08/01/2014 Comment: irregular   SpO2 (!) 89%  Gen:   Awake, no distress   Resp:  Normal effort; expiratory wheezing to all lung fields MSK:   Moves extremities without difficulty  Other:  Patient on 2 LPM of O2 via nasal cannula,  Medical Decision Making  Medically screening exam initiated at 1:45 PM.  Appropriate orders placed.  [name] was informed that the remainder of the evaluation will be completed by another provider, this initial triage assessment does not replace that evaluation, and the importance of remaining in the ED until their evaluation is complete.  Upon arrival oxygen saturation 89% on room air.  Per chart review patient supposed to be on oxygen 2-3 LPM of O2 via nasal cannula continuously.  Patient states that she is not on  any home oxygen.  On 2 LPM of O2 via nasal cannula oxygen saturation 96%.  Due to wheezing will give patient albuterol inhaler in triage   Loni Beckwith, PA-C 05/31/21 1348

## 2021-05-31 NOTE — ED Triage Notes (Signed)
Patient here for re-evaluation of COPD exacerbation. Patient was seen before for same a few weeks ago, prescribed prednisone and states her symptoms temporarily improved. Patient audibly wheezing, complains of shortness of breath, is afebrile in triage, room air SpO2 87% and RR 32/minute.

## 2021-06-02 ENCOUNTER — Other Ambulatory Visit: Payer: Self-pay

## 2021-06-02 ENCOUNTER — Emergency Department (HOSPITAL_COMMUNITY): Payer: Medicare HMO

## 2021-06-02 ENCOUNTER — Inpatient Hospital Stay (HOSPITAL_COMMUNITY)
Admission: EM | Admit: 2021-06-02 | Discharge: 2021-06-06 | DRG: 190 | Disposition: A | Discharge status: Home / Self Care | Payer: Medicare HMO | Attending: Internal Medicine | Admitting: Internal Medicine

## 2021-06-02 ENCOUNTER — Encounter (HOSPITAL_COMMUNITY): Payer: Self-pay

## 2021-06-02 DIAGNOSIS — Z7984 Long term (current) use of oral hypoglycemic drugs: Secondary | ICD-10-CM

## 2021-06-02 DIAGNOSIS — E876 Hypokalemia: Secondary | ICD-10-CM

## 2021-06-02 DIAGNOSIS — Z791 Long term (current) use of non-steroidal anti-inflammatories (NSAID): Secondary | ICD-10-CM

## 2021-06-02 DIAGNOSIS — Z803 Family history of malignant neoplasm of breast: Secondary | ICD-10-CM

## 2021-06-02 DIAGNOSIS — R059 Cough, unspecified: Secondary | ICD-10-CM | POA: Diagnosis not present

## 2021-06-02 DIAGNOSIS — R0602 Shortness of breath: Secondary | ICD-10-CM | POA: Diagnosis not present

## 2021-06-02 DIAGNOSIS — G4733 Obstructive sleep apnea (adult) (pediatric): Secondary | ICD-10-CM | POA: Diagnosis present

## 2021-06-02 DIAGNOSIS — Z888 Allergy status to other drugs, medicaments and biological substances status: Secondary | ICD-10-CM

## 2021-06-02 DIAGNOSIS — Z811 Family history of alcohol abuse and dependence: Secondary | ICD-10-CM

## 2021-06-02 DIAGNOSIS — G8929 Other chronic pain: Secondary | ICD-10-CM | POA: Diagnosis present

## 2021-06-02 DIAGNOSIS — E1169 Type 2 diabetes mellitus with other specified complication: Secondary | ICD-10-CM | POA: Diagnosis present

## 2021-06-02 DIAGNOSIS — K219 Gastro-esophageal reflux disease without esophagitis: Secondary | ICD-10-CM | POA: Diagnosis present

## 2021-06-02 DIAGNOSIS — Z881 Allergy status to other antibiotic agents status: Secondary | ICD-10-CM

## 2021-06-02 DIAGNOSIS — Z9989 Dependence on other enabling machines and devices: Secondary | ICD-10-CM

## 2021-06-02 DIAGNOSIS — J9621 Acute and chronic respiratory failure with hypoxia: Secondary | ICD-10-CM | POA: Diagnosis present

## 2021-06-02 DIAGNOSIS — F17210 Nicotine dependence, cigarettes, uncomplicated: Secondary | ICD-10-CM | POA: Diagnosis present

## 2021-06-02 DIAGNOSIS — E1142 Type 2 diabetes mellitus with diabetic polyneuropathy: Secondary | ICD-10-CM | POA: Diagnosis present

## 2021-06-02 DIAGNOSIS — R079 Chest pain, unspecified: Secondary | ICD-10-CM | POA: Diagnosis not present

## 2021-06-02 DIAGNOSIS — T380X5A Adverse effect of glucocorticoids and synthetic analogues, initial encounter: Secondary | ICD-10-CM | POA: Diagnosis not present

## 2021-06-02 DIAGNOSIS — E669 Obesity, unspecified: Secondary | ICD-10-CM | POA: Diagnosis present

## 2021-06-02 DIAGNOSIS — F909 Attention-deficit hyperactivity disorder, unspecified type: Secondary | ICD-10-CM | POA: Diagnosis present

## 2021-06-02 DIAGNOSIS — I152 Hypertension secondary to endocrine disorders: Secondary | ICD-10-CM | POA: Diagnosis present

## 2021-06-02 DIAGNOSIS — Z8711 Personal history of peptic ulcer disease: Secondary | ICD-10-CM

## 2021-06-02 DIAGNOSIS — J441 Chronic obstructive pulmonary disease with (acute) exacerbation: Principal | ICD-10-CM | POA: Diagnosis present

## 2021-06-02 DIAGNOSIS — M545 Low back pain, unspecified: Secondary | ICD-10-CM | POA: Diagnosis present

## 2021-06-02 DIAGNOSIS — Z20822 Contact with and (suspected) exposure to covid-19: Secondary | ICD-10-CM | POA: Diagnosis present

## 2021-06-02 DIAGNOSIS — E119 Type 2 diabetes mellitus without complications: Secondary | ICD-10-CM

## 2021-06-02 DIAGNOSIS — Z818 Family history of other mental and behavioral disorders: Secondary | ICD-10-CM

## 2021-06-02 DIAGNOSIS — J45901 Unspecified asthma with (acute) exacerbation: Secondary | ICD-10-CM | POA: Diagnosis present

## 2021-06-02 DIAGNOSIS — Z79899 Other long term (current) drug therapy: Secondary | ICD-10-CM

## 2021-06-02 DIAGNOSIS — H409 Unspecified glaucoma: Secondary | ICD-10-CM | POA: Diagnosis present

## 2021-06-02 DIAGNOSIS — M549 Dorsalgia, unspecified: Secondary | ICD-10-CM | POA: Diagnosis present

## 2021-06-02 DIAGNOSIS — J9601 Acute respiratory failure with hypoxia: Secondary | ICD-10-CM

## 2021-06-02 DIAGNOSIS — Z6841 Body Mass Index (BMI) 40.0 and over, adult: Secondary | ICD-10-CM

## 2021-06-02 DIAGNOSIS — E1165 Type 2 diabetes mellitus with hyperglycemia: Secondary | ICD-10-CM | POA: Diagnosis not present

## 2021-06-02 DIAGNOSIS — Z83438 Family history of other disorder of lipoprotein metabolism and other lipidemia: Secondary | ICD-10-CM

## 2021-06-02 DIAGNOSIS — F1010 Alcohol abuse, uncomplicated: Secondary | ICD-10-CM | POA: Diagnosis present

## 2021-06-02 DIAGNOSIS — Z885 Allergy status to narcotic agent status: Secondary | ICD-10-CM

## 2021-06-02 DIAGNOSIS — F419 Anxiety disorder, unspecified: Secondary | ICD-10-CM | POA: Diagnosis present

## 2021-06-02 DIAGNOSIS — Z794 Long term (current) use of insulin: Secondary | ICD-10-CM

## 2021-06-02 DIAGNOSIS — K589 Irritable bowel syndrome without diarrhea: Secondary | ICD-10-CM | POA: Diagnosis present

## 2021-06-02 DIAGNOSIS — E7800 Pure hypercholesterolemia, unspecified: Secondary | ICD-10-CM | POA: Diagnosis present

## 2021-06-02 DIAGNOSIS — F17200 Nicotine dependence, unspecified, uncomplicated: Secondary | ICD-10-CM | POA: Diagnosis present

## 2021-06-02 DIAGNOSIS — Z8249 Family history of ischemic heart disease and other diseases of the circulatory system: Secondary | ICD-10-CM

## 2021-06-02 DIAGNOSIS — Z9981 Dependence on supplemental oxygen: Secondary | ICD-10-CM

## 2021-06-02 DIAGNOSIS — M479 Spondylosis, unspecified: Secondary | ICD-10-CM | POA: Diagnosis present

## 2021-06-02 DIAGNOSIS — Z833 Family history of diabetes mellitus: Secondary | ICD-10-CM

## 2021-06-02 LAB — CBC WITH DIFFERENTIAL/PLATELET
Abs Immature Granulocytes: 0.04 10*3/uL (ref 0.00–0.07)
Basophils Absolute: 0 10*3/uL (ref 0.0–0.1)
Basophils Relative: 0 %
Eosinophils Absolute: 0.1 10*3/uL (ref 0.0–0.5)
Eosinophils Relative: 1 %
HCT: 47.1 % — ABNORMAL HIGH (ref 36.0–46.0)
Hemoglobin: 14.5 g/dL (ref 12.0–15.0)
Immature Granulocytes: 0 %
Lymphocytes Relative: 20 %
Lymphs Abs: 2.2 10*3/uL (ref 0.7–4.0)
MCH: 25.1 pg — ABNORMAL LOW (ref 26.0–34.0)
MCHC: 30.8 g/dL (ref 30.0–36.0)
MCV: 81.5 fL (ref 80.0–100.0)
Monocytes Absolute: 0.6 10*3/uL (ref 0.1–1.0)
Monocytes Relative: 5 %
Neutro Abs: 8.2 10*3/uL — ABNORMAL HIGH (ref 1.7–7.7)
Neutrophils Relative %: 74 %
Platelets: 238 10*3/uL (ref 150–400)
RBC: 5.78 MIL/uL — ABNORMAL HIGH (ref 3.87–5.11)
RDW: 18.5 % — ABNORMAL HIGH (ref 11.5–15.5)
WBC: 11.1 10*3/uL — ABNORMAL HIGH (ref 4.0–10.5)
nRBC: 0 % (ref 0.0–0.2)

## 2021-06-02 LAB — BASIC METABOLIC PANEL
Anion gap: 10 (ref 5–15)
BUN: 6 mg/dL (ref 6–20)
CO2: 27 mmol/L (ref 22–32)
Calcium: 8.8 mg/dL — ABNORMAL LOW (ref 8.9–10.3)
Chloride: 99 mmol/L (ref 98–111)
Creatinine, Ser: 0.59 mg/dL (ref 0.44–1.00)
GFR, Estimated: >60 mL/min (ref >60)
Glucose, Bld: 208 mg/dL — ABNORMAL HIGH (ref 70–99)
Potassium: 3.4 mmol/L — ABNORMAL LOW (ref 3.5–5.1)
Sodium: 136 mmol/L (ref 135–145)

## 2021-06-02 LAB — HEMOGLOBIN A1C
Hgb A1c MFr Bld: 7.8 % — ABNORMAL HIGH (ref 4.8–5.6)
Mean Plasma Glucose: 177.16 mg/dL

## 2021-06-02 LAB — RESP PANEL BY RT-PCR (FLU A&B, COVID) ARPGX2
Influenza A by PCR: NEGATIVE
Influenza B by PCR: NEGATIVE
SARS Coronavirus 2 by RT PCR: NEGATIVE

## 2021-06-02 LAB — GLUCOSE, CAPILLARY
Glucose-Capillary: 231 mg/dL — ABNORMAL HIGH (ref 70–99)
Glucose-Capillary: 291 mg/dL — ABNORMAL HIGH (ref 70–99)

## 2021-06-02 LAB — BRAIN NATRIURETIC PEPTIDE: B Natriuretic Peptide: 42.2 pg/mL (ref 0.0–100.0)

## 2021-06-02 LAB — TROPONIN I (HIGH SENSITIVITY): Troponin I (High Sensitivity): 3 ng/L (ref <18)

## 2021-06-02 LAB — PROCALCITONIN: Procalcitonin: <0.1 ng/mL

## 2021-06-02 MED ORDER — OXYCODONE HCL 5 MG PO TABS
5.0000 mg | ORAL_TABLET | Freq: Four times a day (QID) | ORAL | Status: DC | PRN
  Administered 2021-06-02 – 2021-06-06 (×12): 5 mg via ORAL
  Filled 2021-06-02 (×12): qty 1

## 2021-06-02 MED ORDER — IPRATROPIUM-ALBUTEROL 0.5-2.5 (3) MG/3ML IN SOLN
3.0000 mL | RESPIRATORY_TRACT | Status: AC
  Administered 2021-06-02 (×3): 3 mL via RESPIRATORY_TRACT
  Filled 2021-06-02: qty 3
  Filled 2021-06-02: qty 6

## 2021-06-02 MED ORDER — CLONAZEPAM 0.5 MG PO TABS: 0.5000 mg | ORAL_TABLET | Freq: Two times a day (BID) | ORAL | Status: DC | PRN

## 2021-06-02 MED ORDER — NICOTINE 21 MG/24HR TD PT24
21.0000 mg | MEDICATED_PATCH | Freq: Every day | TRANSDERMAL | Status: DC
  Administered 2021-06-02 – 2021-06-06 (×5): 21 mg via TRANSDERMAL
  Filled 2021-06-02 (×5): qty 1

## 2021-06-02 MED ORDER — METHYLPREDNISOLONE SODIUM SUCC 125 MG IJ SOLR
125.0000 mg | Freq: Once | INTRAMUSCULAR | Status: AC
  Administered 2021-06-02: 125 mg via INTRAVENOUS
  Filled 2021-06-02: qty 2

## 2021-06-02 MED ORDER — DM-GUAIFENESIN ER 30-600 MG PO TB12: 1.0000 | ORAL_TABLET | Freq: Two times a day (BID) | ORAL | Status: DC | PRN

## 2021-06-02 MED ORDER — ENOXAPARIN SODIUM 40 MG/0.4ML IJ SOSY
40.0000 mg | PREFILLED_SYRINGE | INTRAMUSCULAR | Status: DC
  Administered 2021-06-02 – 2021-06-03 (×2): 40 mg via SUBCUTANEOUS
  Filled 2021-06-02 (×2): qty 0.4

## 2021-06-02 MED ORDER — ONDANSETRON HCL 4 MG PO TABS: 4.0000 mg | ORAL_TABLET | Freq: Four times a day (QID) | ORAL | Status: DC | PRN

## 2021-06-02 MED ORDER — METOPROLOL TARTRATE 25 MG PO TABS
25.0000 mg | ORAL_TABLET | Freq: Every day | ORAL | Status: DC
  Administered 2021-06-02 – 2021-06-06 (×5): 25 mg via ORAL
  Filled 2021-06-02 (×6): qty 1

## 2021-06-02 MED ORDER — IPRATROPIUM-ALBUTEROL 0.5-2.5 (3) MG/3ML IN SOLN
3.0000 mL | Freq: Four times a day (QID) | RESPIRATORY_TRACT | Status: DC
  Administered 2021-06-02 – 2021-06-04 (×7): 3 mL via RESPIRATORY_TRACT
  Filled 2021-06-02 (×7): qty 3

## 2021-06-02 MED ORDER — POTASSIUM CHLORIDE CRYS ER 20 MEQ PO TBCR
40.0000 meq | EXTENDED_RELEASE_TABLET | Freq: Four times a day (QID) | ORAL | Status: AC
  Administered 2021-06-02 (×2): 40 meq via ORAL
  Filled 2021-06-02 (×2): qty 2

## 2021-06-02 MED ORDER — MAGNESIUM SULFATE 2 GM/50ML IV SOLN
2.0000 g | Freq: Once | INTRAVENOUS | Status: AC
  Administered 2021-06-02: 2 g via INTRAVENOUS
  Filled 2021-06-02: qty 50

## 2021-06-02 MED ORDER — METHYLPREDNISOLONE SODIUM SUCC 40 MG IJ SOLR: 40.0000 mg | Freq: Two times a day (BID) | INTRAMUSCULAR | Status: DC

## 2021-06-02 MED ORDER — AMPHETAMINE-DEXTROAMPHETAMINE 10 MG PO TABS
30.0000 mg | ORAL_TABLET | Freq: Two times a day (BID) | ORAL | Status: DC
  Administered 2021-06-03 – 2021-06-06 (×7): 30 mg via ORAL
  Filled 2021-06-02 (×8): qty 3

## 2021-06-02 MED ORDER — IPRATROPIUM BROMIDE 0.02 % IN SOLN: 0.5000 mg | Freq: Four times a day (QID) | RESPIRATORY_TRACT | Status: DC

## 2021-06-02 MED ORDER — PREDNISONE 20 MG PO TABS
40.0000 mg | ORAL_TABLET | Freq: Every day | ORAL | Status: DC
  Administered 2021-06-03: 40 mg via ORAL
  Filled 2021-06-02: qty 2

## 2021-06-02 MED ORDER — ACETAMINOPHEN 650 MG RE SUPP: 650.0000 mg | Freq: Four times a day (QID) | RECTAL | Status: DC | PRN

## 2021-06-02 MED ORDER — GLIPIZIDE 5 MG PO TABS
5.0000 mg | ORAL_TABLET | Freq: Two times a day (BID) | ORAL | Status: DC
  Administered 2021-06-02 – 2021-06-06 (×8): 5 mg via ORAL
  Filled 2021-06-02 (×8): qty 1

## 2021-06-02 MED ORDER — LEVOFLOXACIN 750 MG PO TABS
750.0000 mg | ORAL_TABLET | Freq: Every day | ORAL | Status: DC
  Administered 2021-06-02 – 2021-06-03 (×2): 750 mg via ORAL
  Filled 2021-06-02 (×2): qty 1

## 2021-06-02 MED ORDER — ALBUTEROL SULFATE (2.5 MG/3ML) 0.083% IN NEBU: 2.5000 mg | INHALATION_SOLUTION | Freq: Four times a day (QID) | RESPIRATORY_TRACT | Status: DC

## 2021-06-02 MED ORDER — LIRAGLUTIDE 18 MG/3ML ~~LOC~~ SOPN: 1.8000 mg | PEN_INJECTOR | Freq: Every day | SUBCUTANEOUS | Status: DC

## 2021-06-02 MED ORDER — ARIPIPRAZOLE 10 MG PO TABS
5.0000 mg | ORAL_TABLET | Freq: Every day | ORAL | Status: DC
  Administered 2021-06-02 – 2021-06-06 (×5): 5 mg via ORAL
  Filled 2021-06-02 (×5): qty 1

## 2021-06-02 MED ORDER — LOSARTAN POTASSIUM 50 MG PO TABS
25.0000 mg | ORAL_TABLET | Freq: Every day | ORAL | Status: DC
  Administered 2021-06-02 – 2021-06-06 (×5): 25 mg via ORAL
  Filled 2021-06-02 (×6): qty 1

## 2021-06-02 MED ORDER — ATORVASTATIN CALCIUM 20 MG PO TABS
20.0000 mg | ORAL_TABLET | Freq: Two times a day (BID) | ORAL | Status: DC
  Administered 2021-06-02 – 2021-06-06 (×8): 20 mg via ORAL
  Filled 2021-06-02 (×8): qty 1

## 2021-06-02 MED ORDER — ALBUTEROL SULFATE (2.5 MG/3ML) 0.083% IN NEBU: 2.5000 mg | INHALATION_SOLUTION | RESPIRATORY_TRACT | Status: DC | PRN

## 2021-06-02 MED ORDER — DULOXETINE HCL 60 MG PO CPEP
60.0000 mg | ORAL_CAPSULE | Freq: Every day | ORAL | Status: DC
  Administered 2021-06-02 – 2021-06-06 (×5): 60 mg via ORAL
  Filled 2021-06-02 (×3): qty 1
  Filled 2021-06-02: qty 2
  Filled 2021-06-02: qty 1

## 2021-06-02 MED ORDER — METHYLPREDNISOLONE SODIUM SUCC 125 MG IJ SOLR
125.0000 mg | Freq: Every day | INTRAMUSCULAR | Status: DC
  Administered 2021-06-02: 125 mg via INTRAVENOUS
  Filled 2021-06-02: qty 2

## 2021-06-02 MED ORDER — PANTOPRAZOLE SODIUM 40 MG PO TBEC
40.0000 mg | DELAYED_RELEASE_TABLET | Freq: Every day | ORAL | Status: DC
  Administered 2021-06-02 – 2021-06-06 (×5): 40 mg via ORAL
  Filled 2021-06-02 (×5): qty 1

## 2021-06-02 MED ORDER — ONDANSETRON HCL 4 MG/2ML IJ SOLN: 4.0000 mg | Freq: Four times a day (QID) | INTRAMUSCULAR | Status: DC | PRN

## 2021-06-02 MED ORDER — PREDNISONE 20 MG PO TABS: 40.0000 mg | ORAL_TABLET | Freq: Every day | ORAL | Status: DC

## 2021-06-02 MED ORDER — DAPAGLIFLOZIN PROPANEDIOL 10 MG PO TABS
10.0000 mg | ORAL_TABLET | Freq: Every day | ORAL | Status: DC
  Administered 2021-06-02 – 2021-06-06 (×5): 10 mg via ORAL
  Filled 2021-06-02 (×5): qty 1

## 2021-06-02 MED ORDER — INSULIN ASPART 100 UNIT/ML IJ SOLN
0.0000 [IU] | Freq: Three times a day (TID) | INTRAMUSCULAR | Status: DC
  Administered 2021-06-02 – 2021-06-03 (×2): 7 [IU] via SUBCUTANEOUS
  Administered 2021-06-03: 4 [IU] via SUBCUTANEOUS
  Administered 2021-06-03: 15 [IU] via SUBCUTANEOUS
  Administered 2021-06-04: 3 [IU] via SUBCUTANEOUS
  Administered 2021-06-04 (×2): 11 [IU] via SUBCUTANEOUS
  Administered 2021-06-05 (×2): 7 [IU] via SUBCUTANEOUS
  Administered 2021-06-05 – 2021-06-06 (×2): 11 [IU] via SUBCUTANEOUS
  Filled 2021-06-02: qty 0.2

## 2021-06-02 MED ORDER — ISOSORBIDE MONONITRATE ER 30 MG PO TB24
30.0000 mg | ORAL_TABLET | Freq: Every day | ORAL | Status: DC
  Administered 2021-06-02 – 2021-06-06 (×5): 30 mg via ORAL
  Filled 2021-06-02 (×5): qty 1

## 2021-06-02 MED ORDER — KETOROLAC TROMETHAMINE 30 MG/ML IJ SOLN
30.0000 mg | Freq: Once | INTRAMUSCULAR | Status: AC
  Administered 2021-06-02: 30 mg via INTRAVENOUS
  Filled 2021-06-02: qty 1

## 2021-06-02 MED ORDER — ACETAMINOPHEN 325 MG PO TABS: 650.0000 mg | ORAL_TABLET | Freq: Four times a day (QID) | ORAL | Status: DC | PRN

## 2021-06-02 MED ORDER — ONDANSETRON HCL 4 MG/2ML IJ SOLN
4.0000 mg | Freq: Once | INTRAMUSCULAR | Status: AC
  Administered 2021-06-02: 4 mg via INTRAVENOUS
  Filled 2021-06-02: qty 2

## 2021-06-02 MED ORDER — PREGABALIN 75 MG PO CAPS
150.0000 mg | ORAL_CAPSULE | Freq: Two times a day (BID) | ORAL | Status: DC
  Administered 2021-06-02 – 2021-06-06 (×8): 150 mg via ORAL
  Filled 2021-06-02 (×8): qty 2

## 2021-06-02 MED ORDER — HYDROMORPHONE HCL 1 MG/ML IJ SOLN
1.0000 mg | Freq: Once | INTRAMUSCULAR | Status: AC
  Administered 2021-06-02: 1 mg via INTRAVENOUS
  Filled 2021-06-02: qty 1

## 2021-06-02 NOTE — H&P (Signed)
History and Physical    Patient: [name] MAY:[id number] DOB: 07/22/1965 DOA: 06/02/2021 DOS: the patient was seen and examined on 06/02/2021 PCP: Sheila Salter, MD  Patient coming from: Home  Chief Complaint:  Chief Complaint  Patient presents with   Shortness of Breath    HPI: [name] is a 56 y.o. female with medical history significant of ADHD, alcohol abuse, unspecified anemia, anxiety,  chronic back pain, history of leg edema, constipation, headaches, history of polysubstance abuse, history of hypermenorrhea, glaucoma, GERD, gastric erosions, hiatal hernia, hypertension, type 2 diabetes, diabetic peripheral neuropathy, hyperlipidemia, asthma/COPD, history of respiratory insufficiency on as needed home oxygen, active smoker who is coming to the emergency department due to shortness of breath.  He was recently treated with Zithromax and prednisone with only partial relief.  However, she was moving this past weekend and feels like she "overdid it".  Since then, her symptoms have become more pronounced.  She has had increased sputum production, wheezing and dyspnea on exertion.  She had to ask a family member to get her oxygen concentrator.  No fever, positive chills and fatigue.  No rhinorrhea, sore throat, hemoptysis, but has Pleuritic chest pain and palpitations after nebulizer treatment.  She had mild edema several days ago.  Denied abdominal pain, nausea, emesis, diarrhea, constipation, melena or hematochezia.  No flank pain, dysuria, frequency or hematuria.  ED: Initial vital signs were temperature 97.6 F, pulse 99, respiration 20, BP 180/72 mmHg and O2 sat 85% on room air.  The patient received supplemental oxygen, bronchodilators and 125 mg of Solu-Medrol.  Labwork: CBC*white count 11.1, hemoglobin 14.5 g/dL platelets 238.  Troponin and BNP were normal.  Potassium is 3.4, glucose 208 and calcium 8.8 mg/dL.  The rest of the electrolytes and renal function  were normal  Imaging: Two-view chest radiograph showed chronic bronchitic changes.  Review of Systems: As mentioned in the history of present illness. All other systems reviewed and are negative. Past Medical History:  Diagnosis Date   ADHD    Alcohol abuse    Anemia    Anxiety    Asthmatic bronchitis    normal PFT/ seen by pulmonary no evidence of COPD   Atypical chest pain 10/05/2017   Back pain    Bilateral swelling of feet    Chest pain    Chest pain on respiration 03/25/2014   Chronic bronchitis (HCC)    Chronic lower back pain    Chronic respiratory failure ([place]) 10/16/2011   Newly 02 dep 24/7 p discharge from [facility] 01/2013  - 03/13/2014  Walked RA  2 laps @ 185 ft each stopped due to  Sob/ aching in legs, thirsty/ no desat @ slow pace    Chronic respiratory failure with hypoxia (HCC)    On 2-3 L of oxygen at home   Cigarette smoker 12/02/2010   Followed in Pulmonary clinic/ [facility]/ [place]   - Limits of effective care reviewed 08/15/9530     Complication of anesthesia    Constipation    COPD (chronic obstructive pulmonary disease) ([place])    COPD with chronic bronchitis ([place]) 10/01/2017   Daily headache    Depression    Diabetic peripheral neuropathy ([place])    DM (diabetes mellitus) type II controlled, neurological manifestation ([place]) 12/02/2010   Drug use    Gallbladder problem    Gastric erosions    EGD 08/2010.   GERD (gastroesophageal reflux disease)    Glaucoma    H/O drug abuse (  HCC) 11/12/2017   -- scanned document from outside source: [facility] in [place] which showed UDS positive for Adderall /amphetamine usage as well as positive urine for THC.  This test result was collected 08/11/2016 and reported 10/07/2016. - lso review of the chart shows another positive amphetamine, THC and METH in the urine back on 10/18/2015 under "care everywhere". ---So    Heavy menses    High cholesterol    History of blood transfusion     "related to low HgB" (10/05/2017)   History of hiatal hernia    HTN (hypertension)    Hyperlipidemia associated with type 2 diabetes mellitus (HCC) 10/18/2015   Hypertension associated with diabetes (HCC) 12/02/2010   D/c acei 12/22/2011 due to psuedowheeze and narcotic dependent cough> ? Improved - 03/14/2014 started bystolic in place of cozar due to cough     IBS (irritable bowel syndrome)    Increased urinary protein excretion    Internal hemorrhoids    Colonoscopy 5/12.   Iron deficiency anemia 10/01/2017   Joint pain    Mixed diabetic hyperlipidemia associated with type 2 diabetes mellitus (HCC) 10/01/2017   On home oxygen therapy    "5L at night" (10/05/2017)   OSA on CPAP    Osteoarthritis    "back" (10/05/2017)   Oxygen dependent 10/16/2011   Pneumonia    "lots of times" (10/05/2017)   PONV (postoperative nausea and vomiting)    Poorly controlled diabetes mellitus (HCC) 11/12/2017   Pulmonary infiltrates 12/22/2011   Followed in Pulmonary clinic/ [facility]/ [place]    - See CT Chest 05/05/11    Shortness of breath    Tachycardia    never had test done since no insurance   Tobacco use disorder-current smoker greater than 40-pack-year history- since age 21 1 ppd 10/01/2017   Type II diabetes mellitus (HCC)    Vitamin D deficiency    Past Surgical History:  Procedure Laterality Date   CESAREAN SECTION  1988; 1989   CHOLECYSTECTOMY OPEN  1990   COLONOSCOPY  09/16/2010   EHM:CNOBSJ colon/small internal hemorrhoids   ESOPHAGOGASTRODUODENOSCOPY  09/16/2010   SLF: normal/mild gastritis   ESOPHAGOGASTRODUODENOSCOPY N/A 10/19/2014   Procedure: ESOPHAGOGASTRODUODENOSCOPY (EGD);  Surgeon: West Bali, MD;  Location: [facility] ENDO SUITE;  Service: Endoscopy;  Laterality: N/A;  830   FRACTURE SURGERY     HYSTEROSCOPY WITH THERMACHOICE  01/17/2012   Procedure: HYSTEROSCOPY WITH THERMACHOICE;  Surgeon: Lazaro Arms, MD;  Location: [facility] ORS;  Service: Gynecology;  Laterality: N/A;  total therapy  time: 9:13sec  D5W  18 ml in, D5W   75ml out, temperture 87degrees celcious   KIDNEY SURGERY     as child for blockages   LEFT HEART CATH AND CORONARY ANGIOGRAPHY N/A 09/08/2019   Procedure: LEFT HEART CATH AND CORONARY ANGIOGRAPHY;  Surgeon: [place]az[place], Peter M, MD;  Location: [facility] INVASIVE CV LAB;  Service: Cardiovascular;  Laterality: N/A;   TONSILLECTOMY     TUBAL LIGATION  1989   TYMPANOSTOMY TUBE PLACEMENT Bilateral    "several times when I was a child"   uterine ablation     WRIST FRACTURE SURGERY Left 1995   Social History:  reports that she has been smoking cigarettes and e-cigarettes. She started smoking about 43 years ago. She has a 60.00 pack-year smoking history. She uses smokeless tobacco. She reports that she does not currently use alcohol. She reports current drug use. Drug: Marijuana.  Allergies  Allergen Reactions  Codeine Itching and Nausea Only   Metformin And Related Diarrhea and Other (See Comments)    Stomach pain/nausea   Wellbutrin [Bupropion Hcl] Hives   Acyclovir And Related Rash    Family History  Problem Relation Age of Onset   Heart attack Mother 64       deceased   Diabetes Mother    Breast cancer Mother 69       inflammatory breast ca   Heart failure Mother        oxygen dependence, nonsmoker   Heart disease Mother    Depression Mother    Cancer Mother        Breast   Hypertension Mother    Hyperlipidemia Mother    Sudden death Mother    Sleep apnea Mother    Obesity Mother    Heart attack Father 68       deceased, etoh use   Heart disease Father    Alcohol abuse Father    Depression Father    Heart failure Father    Sudden death Father    Ulcers Sister    Hypertension Sister    Heart failure Sister    Depression Sister    Anxiety disorder Sister    Liver disease Maternal Aunt 76       died while on liver transplant list   Liver disease Maternal Uncle    Cancer Paternal Aunt        Breast   Heart disease Maternal Grandmother     Heart attack Maternal Grandmother        premature CAD   Colon cancer Neg Hx     Prior to Admission medications   Medication Sig Start Date End Date Taking? Authorizing Provider  albuterol (PROVENTIL) (2.5 MG/3ML) 0.083% nebulizer solution Take 3 mLs (2.5 mg total) by nebulization every 6 (six) hours as needed for wheezing or shortness of breath. 05/13/21  Yes Francene Finders, PA-C  albuterol (VENTOLIN HFA) 108 (90 Base) MCG/ACT inhaler Inhale 2 puffs into the lungs every 6 (six) hours as needed for wheezing or shortness of breath.   Yes [provider]  amphetamine-dextroamphetamine (ADDERALL) 30 MG tablet Take 1 tablet by mouth 2 (two) times daily. 02/24/21 02/24/22 Yes Cloria Spring, MD  ARIPiprazole (ABILIFY) 5 MG tablet Take 1 tablet (5 mg total) by mouth daily. 02/24/21 02/24/22 Yes Cloria Spring, MD  atorvastatin (LIPITOR) 20 MG tablet Take 20 mg by mouth 2 (two) times daily. 01/03/21  Yes [provider]  clonazePAM (KLONOPIN) 0.5 MG tablet Take 1 tablet (0.5 mg total) by mouth 2 (two) times daily as needed for anxiety. TAKE 1 TABLET(0.5 MG) BY MOUTH TWICE DAILY Patient taking differently: Take 0.5 mg by mouth 2 (two) times daily as needed for anxiety. 02/24/21  Yes Cloria Spring, MD  dapagliflozin propanediol (FARXIGA) 10 MG TABS tablet Take 1 tablet (10 mg total) by mouth daily. 01/10/21  Yes Libby Maw, MD  dextromethorphan [facility]) 30 MG/5ML liquid Take 30 mg by mouth daily as needed for cough.   Yes [provider]  dextromethorphan-guaiFENesin (MUCINEX DM) 30-600 MG 12hr tablet Take 1 tablet by mouth 2 (two) times daily as needed for cough.   Yes [provider]  DULoxetine (CYMBALTA) 60 MG capsule TAKE 1 CAPSULE(60 MG) BY MOUTH DAILY Patient taking differently: 60 mg daily. 04/25/21  Yes Cloria Spring, MD  fluticasone (FLONASE) 50 MCG/ACT nasal spray INSTILL 1 SPRAY INTO EACH NOSTRIL TWICE DAILY AFTER  SINUS RINSES. PLEASE SCHEDULE  APPOINTMENT FOR FURTHER REFILLS Patient taking differently: 1 spray 2 (two) times daily as needed for allergies or rhinitis (after sinus rinse). 05/11/20  Yes Cirigliano, Mary K, DO  furosemide (LASIX) 40 MG tablet AS NEEDED for weight increase of 3 lbs overnight or 5 lbs in 1 week Patient taking differently: Take 40 mg by mouth daily as needed for edema. AS NEEDED for weight increase of 3 lbs overnight or 5 lbs in 1 week 10/29/19  Yes Donato Heinz, MD  glipiZIDE (GLUCOTROL) 5 MG tablet TAKE 1 TABLET(5 MG) BY MOUTH TWICE DAILY BEFORE A MEAL Patient taking differently: Take 5 mg by mouth 2 (two) times daily before a meal. 03/25/21  Yes Sheila Salter, MD  ibuprofen (ADVIL) 200 MG tablet Take 200 mg by mouth every 6 (six) hours as needed for mild pain.   Yes [provider]  isosorbide mononitrate (IMDUR) 30 MG 24 hr tablet TAKE 1 TABLET(30 MG) BY MOUTH DAILY. MAKE APPOINTMENT WITH PROVIDER BEFORE FURTHER REFILLS- FIRST ATTEMPT Patient taking differently: 30 mg daily. 05/10/21  Yes Belva Crome, MD  liraglutide (VICTOZA) 18 MG/3ML SOPN Inject 1.8 mg into the skin daily. 10/20/20  Yes Cirigliano, Mary K, DO  losartan (COZAAR) 25 MG tablet TAKE 1 TABLET(25 MG) BY MOUTH DAILY Patient taking differently: Take 25 mg by mouth daily. 11/18/20  Yes Cirigliano, Mary K, DO  meloxicam (MOBIC) 15 MG tablet TAKE 1 TABLET(15 MG) BY MOUTH DAILY Patient taking differently: Take 15 mg by mouth daily. 04/05/21  Yes Libby Maw, MD  metoprolol tartrate (LOPRESSOR) 25 MG tablet TAKE 1 TABLET(25 MG) BY MOUTH TWICE DAILY Patient taking differently: Take 25 mg by mouth daily. 03/25/21  Yes Donato Heinz, MD  nystatin (MYCOSTATIN/NYSTOP) powder Apply 1 application topically 2 (two) times daily. Patient taking differently: Apply 1 application topically 2 (two) times daily as needed (rash). 11/19/20  Yes [name], MD  OXYGEN Inhale 2 L into the lungs daily as needed (low ox  levels).   Yes [provider]  pantoprazole (PROTONIX) 40 MG tablet Take 40 mg by mouth daily. 10/25/16  Yes [provider]  pregabalin (LYRICA) 150 MG capsule Take 1 capsule (150 mg total) by mouth 2 (two) times daily. 01/05/21  Yes Sheila Salter, MD  ACCU-CHEK GUIDE test strip USE UP TO FOUR TIMES DAILY AS DIRECTED 08/18/19   Cirigliano, Garvin Fila, DO  Accu-Chek Softclix Lancets lancets USE UP TO FOUR TIMES DAILY AS DIRECTED 06/20/19   Cirigliano, Mary K, DO  amphetamine-dextroamphetamine (ADDERALL) 30 MG tablet Take 1 tablet by mouth 2 (two) times daily. Patient not taking: Reported on 06/02/2021 02/24/21 02/24/22  Cloria Spring, MD  amphetamine-dextroamphetamine (ADDERALL) 30 MG tablet Take 1 tablet by mouth 2 (two) times daily. Patient not taking: Reported on 06/02/2021 05/02/21 05/02/22  Cloria Spring, MD  blood glucose meter kit and supplies Dispense based on patient and insurance preference. Use up to four times daily as directed. (FOR ICD-10 E10.9, E11.9). 06/20/19   Cirigliano, Garvin Fila, DO  Insulin Pen Needle (BD PEN NEEDLE NANO 2ND GEN) 32G X 4 MM MISC USE DAILY WITH VICTOZA 06/25/20   Ronnald Nian, DO    Physical Exam: Vitals:   06/02/21 1306 06/02/21 1410 06/02/21 1530 06/02/21 1628  BP:  (!) 144/81  (!) 172/73  Pulse: 98 98  87  Resp:  20    Temp:      TempSrc:  SpO2: 92% 93% 93% 95%   Physical Exam Constitutional:      Appearance: She is obese.  HENT:     Head: Normocephalic.  Eyes:     Pupils: Pupils are equal, round, and reactive to light.  Neck:     Vascular: No JVD.  Cardiovascular:     Rate and Rhythm: Normal rate and regular rhythm.  Pulmonary:     Effort: Pulmonary effort is normal. Tachypnea present.     Breath sounds: Decreased breath sounds, wheezing and rhonchi present.  Abdominal:     Palpations: Abdomen is soft.     Tenderness: There is no abdominal tenderness.  Musculoskeletal:     Cervical back: Normal range of motion and neck  supple.     Right lower leg: No edema.     Left lower leg: No edema.  Skin:    General: Skin is warm and dry.  Neurological:     General: No focal deficit present.     Mental Status: She is alert and oriented to person, place, and time.     Data Reviewed:  There are no new results to review at this time.  Assessment and Plan: Principal Problem:   Acute respiratory failure with hypoxia ([place]) In the setting of COPD exacerbation Observation/telemetry. Continue supplemental oxygen. Monitor pulse oximetry. Bronchodilators as needed. Solu-Medrol followed by prednisone taper. Check procalcitonin level. Check sputum Gram stain culture and sensitivity. Begin levofloxacin 750 mg p.o. daily. Smoking cessation advised.  Active Problems:   GERD (gastroesophageal reflux disease) Continue pantoprazole 40 mg p.o. daily.    Diabetes mellitus, type II ([place]) Carbohydrate modified diet. Continue daily Victoza injections. Continue glipizide 5 mg p.o. twice daily. CBG monitoring with [place] SS while on glucocorticoids.    Back pain Oxycodone every 4-6 hours as needed.    OSA on CPAP Continue CPAP at bedtime.    Adult attention deficit hyperactivity disorder Continue Adderall.    Tobacco use disorder-current smoker greater     than 40-pack-year history- since age 49 1 ppd Nicotine replacement therapy as needed.     Advance Care Planning:   Code Status: Full Code   Consults:   Family Communication: Her significant other was at bedside.  Severity of Illness: The appropriate patient status for this patient is OBSERVATION. Observation status is judged to be reasonable and necessary in order to provide the required intensity of service to ensure the patient's safety. The patient's presenting symptoms, physical exam findings, and initial radiographic and laboratory data in the context of their medical condition is felt to place them at decreased risk for further clinical deterioration.  Furthermore, it is anticipated that the patient will be medically stable for discharge from the hospital within 2 midnights of admission.   Author: Reubin Milan, MD 06/02/2021 4:49 PM  For on call review [email or web address].   This document was prepared using [employer] and may contain some unintended transcription errors.

## 2021-06-02 NOTE — ED Triage Notes (Signed)
Pt presents with c/o shortness of breath for several days. Pt reports a hx of COPD, labored in triage. Pt reports she has noticed that her spO2 has been low at home, does not normally wear oxygen but has some at home if needed. Pt reports some chest pain in addition to her shortness of breath.

## 2021-06-02 NOTE — Progress Notes (Signed)
Pt set up on cpap for the night. ?

## 2021-06-02 NOTE — ED Provider Notes (Signed)
[facility] DEPT Provider Note   CSN: [id number] Arrival date & time: 06/02/21  1033     History  Chief Complaint  Patient presents with   Shortness of Breath    [name] is a 56 y.o. female.  56 yo F with 2 weeks of cough and congestion has had worsening difficulty breathing however the past week or so.  She had seen an urgent care provider and was started on steroids and azithromycin.  She had some improvement while on those medicines but stopped about 2 days ago and had a worsening.  Also has noted that she has been profoundly hypoxic on her home pulse ox.  Down into the 63s and 43s with minimal ambulation.  She does have oxygen at home but just use as needed.  Has been using her breathing treatments at home but without significant improvement.  Has had increased sputum does not feel like her sputum has changed but is maybe more thick.  No fevers but has felt hot and cold off and on.   Shortness of Breath     Home Medications Prior to Admission medications   Medication Sig Start Date End Date Taking? Authorizing Provider  ACCU-CHEK GUIDE test strip USE UP TO FOUR TIMES DAILY AS DIRECTED 08/18/19   Cirigliano, Mary K, DO  Accu-Chek Softclix Lancets lancets USE UP TO FOUR TIMES DAILY AS DIRECTED 06/20/19   Cirigliano, Mary K, DO  albuterol (PROVENTIL) (2.5 MG/3ML) 0.083% nebulizer solution Take 3 mLs (2.5 mg total) by nebulization every 6 (six) hours as needed for wheezing or shortness of breath. 05/13/21   Francene Finders, PA-C  albuterol (VENTOLIN HFA) 108 (90 Base) MCG/ACT inhaler Inhale 2 puffs into the lungs every 6 (six) hours as needed for wheezing or shortness of breath.    [provider]  amphetamine-dextroamphetamine (ADDERALL) 30 MG tablet Take 1 tablet by mouth 2 (two) times daily. 02/24/21 02/24/22  Cloria Spring, MD  amphetamine-dextroamphetamine (ADDERALL) 30 MG tablet Take 1 tablet by mouth 2 (two) times daily.  02/24/21 02/24/22  Cloria Spring, MD  amphetamine-dextroamphetamine (ADDERALL) 30 MG tablet Take 1 tablet by mouth 2 (two) times daily. 05/02/21 05/02/22  Cloria Spring, MD  ARIPiprazole (ABILIFY) 5 MG tablet Take 1 tablet (5 mg total) by mouth daily. 02/24/21 02/24/22  Cloria Spring, MD  atorvastatin (LIPITOR) 20 MG tablet Take 20 mg by mouth 2 (two) times daily. 01/03/21   [provider]  azithromycin (ZITHROMAX) 250 MG tablet Take 1 tablet (250 mg total) by mouth daily. Take first 2 tablets together, then 1 every day until finished. 05/13/21   Francene Finders, PA-C  blood glucose meter kit and supplies Dispense based on patient and insurance preference. Use up to four times daily as directed. (FOR ICD-10 E10.9, E11.9). 06/20/19   Cirigliano, Garvin Fila, DO  clonazePAM (KLONOPIN) 0.5 MG tablet Take 1 tablet (0.5 mg total) by mouth 2 (two) times daily as needed for anxiety. TAKE 1 TABLET(0.5 MG) BY MOUTH TWICE DAILY 02/24/21   Cloria Spring, MD  dapagliflozin propanediol (FARXIGA) 10 MG TABS tablet Take 1 tablet (10 mg total) by mouth daily. 01/10/21   Libby Maw, MD  DULoxetine (CYMBALTA) 60 MG capsule TAKE 1 CAPSULE(60 MG) BY MOUTH DAILY 04/25/21   Cloria Spring, MD  fluticasone (FLONASE) 50 MCG/ACT nasal spray INSTILL 1 SPRAY INTO EACH NOSTRIL TWICE DAILY AFTER SINUS RINSES. PLEASE SCHEDULE APPOINTMENT FOR FURTHER REFILLS 05/11/20  Cirigliano, Mary K, DO  furosemide (LASIX) 40 MG tablet AS NEEDED for weight increase of 3 lbs overnight or 5 lbs in 1 week 10/29/19   Donato Heinz, MD  glipiZIDE (GLUCOTROL) 5 MG tablet TAKE 1 TABLET(5 MG) BY MOUTH TWICE DAILY BEFORE A MEAL 03/25/21   Haydee Salter, MD  ibuprofen (ADVIL) 200 MG tablet Take 200 mg by mouth every 6 (six) hours as needed.    [provider]  Insulin Pen Needle (BD PEN NEEDLE NANO 2ND GEN) 32G X 4 MM MISC USE DAILY WITH VICTOZA 06/25/20   Cirigliano, Mary K, DO  isosorbide mononitrate (IMDUR) 30 MG 24 hr  tablet TAKE 1 TABLET(30 MG) BY MOUTH DAILY. MAKE APPOINTMENT WITH PROVIDER BEFORE FURTHER REFILLS- FIRST ATTEMPT 05/10/21   Belva Crome, MD  liraglutide (VICTOZA) 18 MG/3ML SOPN Inject 1.8 mg into the skin daily. 10/20/20   Cirigliano, Garvin Fila, DO  losartan (COZAAR) 25 MG tablet TAKE 1 TABLET(25 MG) BY MOUTH DAILY Patient taking differently: 12.5 mg. 11/18/20   Ronnald Nian, DO  meloxicam (MOBIC) 15 MG tablet TAKE 1 TABLET(15 MG) BY MOUTH DAILY 04/05/21   Libby Maw, MD  metoprolol tartrate (LOPRESSOR) 25 MG tablet TAKE 1 TABLET(25 MG) BY MOUTH TWICE DAILY 03/25/21   Donato Heinz, MD  nystatin (MYCOSTATIN/NYSTOP) powder Apply 1 application topically 2 (two) times daily. 11/19/20   [name], MD  pantoprazole (PROTONIX) 40 MG tablet Take 1 tablet by mouth daily. 10/25/16   [provider]  pregabalin (LYRICA) 150 MG capsule Take 1 capsule (150 mg total) by mouth 2 (two) times daily. 01/05/21   Haydee Salter, MD      Allergies    Codeine, Metformin and related, Wellbutrin [bupropion hcl], and Acyclovir and related    Review of Systems   Review of Systems  Respiratory:  Positive for shortness of breath.    Physical Exam Updated Vital Signs BP (!) 146/63    Pulse (!) 106    Temp 97.6 F (36.4 C) (Oral)    Resp 20    LMP 08/01/2014 Comment: irregular   SpO2 94%  Physical Exam Vitals and nursing note reviewed.  Constitutional:      General: She is not in acute distress.    Appearance: She is well-developed. She is not diaphoretic.  HENT:     Head: Normocephalic and atraumatic.  Eyes:     Pupils: Pupils are equal, round, and reactive to light.  Cardiovascular:     Rate and Rhythm: Normal rate and regular rhythm.     Heart sounds: No murmur heard.   No friction rub. No gallop.  Pulmonary:     Effort: Pulmonary effort is normal.     Breath sounds: Decreased breath sounds and wheezing present. No rales.     Comments: Diffuse wheezes with  prolonged expiratory effort noted in all fields.  Tachypnea Abdominal:     General: There is no distension.     Palpations: Abdomen is soft.     Tenderness: There is no abdominal tenderness.  Musculoskeletal:        General: No tenderness.     Cervical back: Normal range of motion and neck supple.  Skin:    General: Skin is warm and dry.  Neurological:     Mental Status: She is alert and oriented to person, place, and time.  Psychiatric:        Behavior: Behavior normal.    ED Results / Procedures /  Treatments   Labs (all labs ordered are listed, but only abnormal results are displayed) Labs Reviewed  CBC WITH DIFFERENTIAL/PLATELET - Abnormal; Notable for the following components:      Result Value   WBC 11.1 (*)    RBC 5.78 (*)    HCT 47.1 (*)    MCH 25.1 (*)    RDW 18.5 (*)    Neutro Abs 8.2 (*)    All other components within normal limits  BASIC METABOLIC PANEL - Abnormal; Notable for the following components:   Potassium 3.4 (*)    Glucose, Bld 208 (*)    Calcium 8.8 (*)    All other components within normal limits  RESP PANEL BY RT-PCR (FLU A&B, COVID) ARPGX2  BRAIN NATRIURETIC PEPTIDE  TROPONIN I (HIGH SENSITIVITY)    EKG EKG Interpretation  Date/Time:  Thursday June 02 2021 10:48:58 EST Ventricular Rate:  103 PR Interval:  127 QRS Duration: 86 QT Interval:  351 QTC Calculation: 460 R Axis:   -7 Text Interpretation: Sinus tachycardia Low voltage, precordial leads No significant change since last tracing Confirmed by Deno Etienne [phone]) on 06/02/2021 11:18:55 AM  Radiology DG Chest 2 View  Result Date: 06/02/2021 CLINICAL DATA:  Shortness of breath for several days. Central chest pain. Cough for the past 2 weeks. Smoker. Diabetes. EXAM: CHEST - 2 VIEW COMPARISON:  05/31/2021 FINDINGS: Normal sized heart. Aortic arch calcifications. Stable mild diffuse peribronchial thickening and accentuation of the interstitial markings. Normal vascularity. No airspace  consolidation. Mild thoracic spine degenerative changes. IMPRESSION: Stable chronic bronchitic changes.  No acute abnormality. Electronically Signed   By: Claudie Revering M.D.   On: 06/02/2021 11:08   DG Chest 2 View  Result Date: 05/31/2021 CLINICAL DATA:  Cough, shortness of breath, congestion, and chest pain for 1 month, history of asthmatic bronchitis, COPD, diabetes mellitus, hypertension EXAM: CHEST - 2 VIEW COMPARISON:  05/13/2021 FINDINGS: Normal heart size, mediastinal contours, and pulmonary vascularity. Atherosclerotic calcification aorta. Chronic bronchitic changes and accentuation of perihilar markings similar to previous exam. Mild subsegmental atelectasis at lung bases. No acute consolidation, pleural effusion, or pneumothorax. Bones demineralized. IMPRESSION: Chronic bronchitic changes and bibasilar atelectasis. No acute abnormalities. Aortic Atherosclerosis (ICD10-I70.0). Electronically Signed   By: Lavonia Dana M.D.   On: 05/31/2021 14:57    Procedures Procedures    Medications Ordered in ED Medications  methylPREDNISolone sodium succinate (SOLU-MEDROL) 125 mg/2 mL injection 125 mg (125 mg Intravenous Given 06/02/21 1126)  ipratropium-albuterol (DUONEB) 0.5-2.5 (3) MG/3ML nebulizer solution 3 mL (3 mLs Nebulization Given 06/02/21 1131)  magnesium sulfate IVPB 2 g 50 mL (0 g Intravenous Stopped 06/02/21 1142)    ED Course/ Medical Decision Making/ A&P                           Medical Decision Making Amount and/or Complexity of Data Reviewed Labs: ordered. Radiology: ordered.  Risk Prescription drug management. Decision regarding hospitalization.   Patient is a 56 y.o. female with a cc of difficulty breathing.  Patient has a history of COPD and thinks that this feels similar.  She has been seen as an outpatient and was treated with a burst of steroids to azithromycin.  She had some transient improvement and then had worsening.  She has oxygen at home but it is there for use as  needed.  She is using a home pulse ox and noted her oxygen level was quite low.  Here she is in  the mid 80s at rest.  Placed on 2 L of oxygen with improvement.  History and exam are consistent with a COPD exacerbation.  Chest x-ray blood work 3 DuoNebs back-to-back steroids magnesium and reassess. I independently interpreted the patients labs and imaging chest x-ray independently interpreted by me without focal infiltrate or pneumothorax.  Troponin is negative no significant electrolyte abnormality.  No significant leukocytosis.  Patient reassessed and is feeling a bit better.  Improved lung aeration.  Still requiring oxygen and still with some residual tachypnea.  I discussed the case with the hospitalist.  Will admit.  CRITICAL CARE Performed by: Cecilio Asper   Total critical care time: 35 minutes  Critical care time was exclusive of separately billable procedures and treating other patients.  Critical care was necessary to treat or prevent imminent or life-threatening deterioration.  Critical care was time spent personally by me on the following activities: development of treatment plan with patient and/or surrogate as well as nursing, discussions with consultants, evaluation of patient's response to treatment, examination of patient, obtaining history from patient or surrogate, ordering and performing treatments and interventions, ordering and review of laboratory studies, ordering and review of radiographic studies, pulse oximetry and re-evaluation of patient's condition.  The patients results and plan were reviewed and discussed.   Any x-rays performed were independently reviewed by myself.   Differential diagnosis were considered with the presenting HPI.  Medications  methylPREDNISolone sodium succinate (SOLU-MEDROL) 125 mg/2 mL injection 125 mg (125 mg Intravenous Given 06/02/21 1126)  ipratropium-albuterol (DUONEB) 0.5-2.5 (3) MG/3ML nebulizer solution 3 mL (3 mLs Nebulization  Given 06/02/21 1131)  magnesium sulfate IVPB 2 g 50 mL (0 g Intravenous Stopped 06/02/21 1142)    Vitals:   06/02/21 1127 06/02/21 1130 06/02/21 1131 06/02/21 1200  BP:  123/63  (!) 146/63  Pulse:  (!) 102  (!) 106  Resp:  20  20  Temp:      TempSrc:      SpO2: 94% 94% 94% 94%    Final diagnoses:  Acute on chronic respiratory failure with hypoxia (HCC)  COPD exacerbation (HCC)    Admission/ observation were discussed with the admitting physician, patient and/or family and they are comfortable with the plan.          Final Clinical Impression(s) / ED Diagnoses Final diagnoses:  Acute on chronic respiratory failure with hypoxia ([place])  COPD exacerbation [facility])    Rx / DC Orders ED Discharge Orders     None         Deno Etienne, DO 06/02/21 1243

## 2021-06-03 DIAGNOSIS — E1142 Type 2 diabetes mellitus with diabetic polyneuropathy: Secondary | ICD-10-CM | POA: Diagnosis not present

## 2021-06-03 DIAGNOSIS — Z6841 Body Mass Index (BMI) 40.0 and over, adult: Secondary | ICD-10-CM | POA: Diagnosis not present

## 2021-06-03 DIAGNOSIS — Z20822 Contact with and (suspected) exposure to covid-19: Secondary | ICD-10-CM | POA: Diagnosis not present

## 2021-06-03 DIAGNOSIS — K589 Irritable bowel syndrome without diarrhea: Secondary | ICD-10-CM | POA: Diagnosis not present

## 2021-06-03 DIAGNOSIS — K219 Gastro-esophageal reflux disease without esophagitis: Secondary | ICD-10-CM | POA: Diagnosis not present

## 2021-06-03 DIAGNOSIS — G8929 Other chronic pain: Secondary | ICD-10-CM | POA: Diagnosis not present

## 2021-06-03 DIAGNOSIS — Z9981 Dependence on supplemental oxygen: Secondary | ICD-10-CM | POA: Diagnosis not present

## 2021-06-03 DIAGNOSIS — T380X5A Adverse effect of glucocorticoids and synthetic analogues, initial encounter: Secondary | ICD-10-CM | POA: Diagnosis not present

## 2021-06-03 DIAGNOSIS — J9621 Acute and chronic respiratory failure with hypoxia: Secondary | ICD-10-CM | POA: Diagnosis not present

## 2021-06-03 DIAGNOSIS — J441 Chronic obstructive pulmonary disease with (acute) exacerbation: Secondary | ICD-10-CM | POA: Diagnosis not present

## 2021-06-03 DIAGNOSIS — F1010 Alcohol abuse, uncomplicated: Secondary | ICD-10-CM | POA: Diagnosis present

## 2021-06-03 DIAGNOSIS — G4733 Obstructive sleep apnea (adult) (pediatric): Secondary | ICD-10-CM | POA: Diagnosis not present

## 2021-06-03 DIAGNOSIS — E1165 Type 2 diabetes mellitus with hyperglycemia: Secondary | ICD-10-CM | POA: Diagnosis not present

## 2021-06-03 DIAGNOSIS — M479 Spondylosis, unspecified: Secondary | ICD-10-CM | POA: Diagnosis not present

## 2021-06-03 DIAGNOSIS — I152 Hypertension secondary to endocrine disorders: Secondary | ICD-10-CM | POA: Diagnosis not present

## 2021-06-03 DIAGNOSIS — R0602 Shortness of breath: Secondary | ICD-10-CM | POA: Diagnosis not present

## 2021-06-03 DIAGNOSIS — H409 Unspecified glaucoma: Secondary | ICD-10-CM | POA: Diagnosis not present

## 2021-06-03 DIAGNOSIS — J45901 Unspecified asthma with (acute) exacerbation: Secondary | ICD-10-CM | POA: Diagnosis not present

## 2021-06-03 DIAGNOSIS — J9601 Acute respiratory failure with hypoxia: Secondary | ICD-10-CM | POA: Diagnosis not present

## 2021-06-03 DIAGNOSIS — F909 Attention-deficit hyperactivity disorder, unspecified type: Secondary | ICD-10-CM | POA: Diagnosis not present

## 2021-06-03 DIAGNOSIS — E669 Obesity, unspecified: Secondary | ICD-10-CM | POA: Diagnosis not present

## 2021-06-03 DIAGNOSIS — F419 Anxiety disorder, unspecified: Secondary | ICD-10-CM | POA: Diagnosis not present

## 2021-06-03 DIAGNOSIS — E7800 Pure hypercholesterolemia, unspecified: Secondary | ICD-10-CM | POA: Diagnosis not present

## 2021-06-03 DIAGNOSIS — M545 Low back pain, unspecified: Secondary | ICD-10-CM | POA: Diagnosis not present

## 2021-06-03 DIAGNOSIS — E1169 Type 2 diabetes mellitus with other specified complication: Secondary | ICD-10-CM | POA: Diagnosis not present

## 2021-06-03 DIAGNOSIS — E876 Hypokalemia: Secondary | ICD-10-CM | POA: Diagnosis not present

## 2021-06-03 DIAGNOSIS — R69 Illness, unspecified: Secondary | ICD-10-CM | POA: Diagnosis not present

## 2021-06-03 LAB — COMPREHENSIVE METABOLIC PANEL
ALT: 20 U/L (ref 0–44)
AST: 17 U/L (ref 15–41)
Albumin: 3.4 g/dL — ABNORMAL LOW (ref 3.5–5.0)
Alkaline Phosphatase: 56 U/L (ref 38–126)
Anion gap: 8 (ref 5–15)
BUN: 17 mg/dL (ref 6–20)
CO2: 28 mmol/L (ref 22–32)
Calcium: 8.8 mg/dL — ABNORMAL LOW (ref 8.9–10.3)
Chloride: 101 mmol/L (ref 98–111)
Creatinine, Ser: 0.75 mg/dL (ref 0.44–1.00)
GFR, Estimated: >60 mL/min (ref >60)
Glucose, Bld: 151 mg/dL — ABNORMAL HIGH (ref 70–99)
Potassium: 4.8 mmol/L (ref 3.5–5.1)
Sodium: 137 mmol/L (ref 135–145)
Total Bilirubin: 0.2 mg/dL — ABNORMAL LOW (ref 0.3–1.2)
Total Protein: 7.2 g/dL (ref 6.5–8.1)

## 2021-06-03 LAB — GLUCOSE, CAPILLARY
Glucose-Capillary: 224 mg/dL — ABNORMAL HIGH (ref 70–99)
Glucose-Capillary: 178 mg/dL — ABNORMAL HIGH (ref 70–99)
Glucose-Capillary: 301 mg/dL — ABNORMAL HIGH (ref 70–99)
Glucose-Capillary: 269 mg/dL — ABNORMAL HIGH (ref 70–99)

## 2021-06-03 LAB — CBC
HCT: 45.5 % (ref 36.0–46.0)
Hemoglobin: 13.7 g/dL (ref 12.0–15.0)
MCH: 25 pg — ABNORMAL LOW (ref 26.0–34.0)
MCHC: 30.1 g/dL (ref 30.0–36.0)
MCV: 83.2 fL (ref 80.0–100.0)
Platelets: 260 10*3/uL (ref 150–400)
RBC: 5.47 MIL/uL — ABNORMAL HIGH (ref 3.87–5.11)
RDW: 17.9 % — ABNORMAL HIGH (ref 11.5–15.5)
WBC: 14.6 10*3/uL — ABNORMAL HIGH (ref 4.0–10.5)
nRBC: 0 % (ref 0.0–0.2)

## 2021-06-03 LAB — PHOSPHORUS: Phosphorus: 4.3 mg/dL (ref 2.5–4.6)

## 2021-06-03 LAB — PROCALCITONIN: Procalcitonin: <0.1 ng/mL

## 2021-06-03 LAB — HIV ANTIBODY (ROUTINE TESTING W REFLEX): HIV Screen 4th Generation wRfx: NONREACTIVE

## 2021-06-03 MED ORDER — BUDESONIDE 0.25 MG/2ML IN SUSP
0.2500 mg | Freq: Two times a day (BID) | RESPIRATORY_TRACT | Status: DC
  Administered 2021-06-03 – 2021-06-06 (×7): 0.25 mg via RESPIRATORY_TRACT
  Filled 2021-06-03 (×7): qty 2

## 2021-06-03 MED ORDER — METHYLPREDNISOLONE SODIUM SUCC 125 MG IJ SOLR
60.0000 mg | Freq: Two times a day (BID) | INTRAMUSCULAR | Status: DC
  Administered 2021-06-03 – 2021-06-06 (×7): 60 mg via INTRAVENOUS
  Filled 2021-06-03 (×7): qty 2

## 2021-06-03 MED ORDER — GUAIFENESIN ER 600 MG PO TB12
600.0000 mg | ORAL_TABLET | Freq: Two times a day (BID) | ORAL | Status: DC
  Administered 2021-06-03 (×2): 600 mg via ORAL
  Filled 2021-06-03 (×3): qty 1

## 2021-06-03 MED ORDER — AZITHROMYCIN 250 MG PO TABS
250.0000 mg | ORAL_TABLET | Freq: Every day | ORAL | Status: AC
  Administered 2021-06-04 – 2021-06-06 (×3): 250 mg via ORAL
  Filled 2021-06-03 (×3): qty 1

## 2021-06-03 MED ORDER — ARFORMOTEROL TARTRATE 15 MCG/2ML IN NEBU
15.0000 ug | INHALATION_SOLUTION | Freq: Two times a day (BID) | RESPIRATORY_TRACT | Status: DC
  Administered 2021-06-03 – 2021-06-05 (×5): 15 ug via RESPIRATORY_TRACT
  Filled 2021-06-03 (×9): qty 2

## 2021-06-03 NOTE — Assessment & Plan Note (Signed)
-

## 2021-06-03 NOTE — Progress Notes (Signed)
°  Transition of Care [facility]) Screening Note   Patient Details  Name: [name] Date of Birth: 01-04-1966   Transition of [facility]) CM/SW Contact:    Lennart Pall, LCSW Phone Number: 06/03/2021, 9:54 AM    Transition of Care Department [facility]) has reviewed patient and no TOC needs have been identified at this time. We will continue to monitor patient advancement through interdisciplinary progression rounds. If new patient transition needs arise, please place a TOC consult.

## 2021-06-03 NOTE — Assessment & Plan Note (Signed)
Continue with oxycodone as needed

## 2021-06-03 NOTE — Assessment & Plan Note (Addendum)
Patient presented with worsening shortness of breath and cough, found to be hypoxic oxygen saturation 85 on room air. Secondary to asthma/COPD exacerbation Counseled about smoking cessation. Treated  with nebulizer. initially required 4 L oxygen, now down to RA She if feeling better, plan to discharge home today with prednisone taper. Resume as needed oxygen at home.

## 2021-06-03 NOTE — Hospital Course (Addendum)
56 year old past medical history significant for ADHD, alcohol abuse,anemia, anxiety, chronic back pain, history of leg edema, constipation, polysubstance abuse, GERD, gastric erosion, hiatal hernia, hypertension, diabetes type 2 neuropathy, asthma COPD who presents complaining of worsening shortness of breath for the last week.  She uses oxygen at home on as-needed basis, she is still smoking.  She reported worsening shortness of breath and cough for the last 2 weeks.  Her oxygen dropped to the 70s at home.  In the ED she was found to be hypoxic oxygen saturation 85 on room air, chest x-ray showed bronchitic changes.  She was admitted for COPD exacerbation. Treated with IV steroids, nebulizer. She improved, wean off oxygen. She will be discharge home with prednisone for 5 days.

## 2021-06-03 NOTE — Assessment & Plan Note (Signed)
Continue with Adderall.

## 2021-06-03 NOTE — Progress Notes (Deleted)
Pt set up for CPAP for night. Initially pt said she wants to go on CPAP. But when RT went in to place her on CPAP pt refused. She said she not going to sleep with that thing on her face. Machine stand by. RN aware to call if pt decides.

## 2021-06-03 NOTE — Progress Notes (Signed)
Progress Note   Patient: [name] TKW:[id number] DOB: [birth year] DOA: 06/02/2021     0 DOS: the patient was seen and examined on 06/03/2021   Brief hospital course: 56 year old past medical history significant for ADHD, alcohol abuse,anemia, anxiety, chronic back pain, history of leg edema, constipation, polysubstance abuse, GERD, gastric erosion, hiatal hernia, hypertension, diabetes type 2 neuropathy, asthma COPD who presents complaining of worsening shortness of breath for the last week.  She uses oxygen at home on as-needed basis, she is still smoking.  She reported worsening shortness of breath and cough for the last 2 weeks.  Her oxygen dropped to the 70s at home.  In the ED she was found to be hypoxic oxygen saturation 85 on room air, chest x-ray showed bronchitic changes.  She was admitted for COPD exacerbation.  Assessment and Plan: * Acute respiratory failure with hypoxia ([place])- (present on admission) Patient presented with worsening shortness of breath and cough, found to be hypoxic oxygen saturation 85 on room air. Secondary to asthma/COPD exacerbation Counseled about smoking cessation. Continue with nebulizer. Wean off oxygen as possible.  She is currently on 4 L of oxygen.  Tobacco use disorder-current smoker greater than 40-pack-year history- since age 69 1 ppd- (present on admission) Counseling provided  Adult attention deficit hyperactivity disorder- (present on admission) Continue with Adderall.  OSA on CPAP Continue with CPAP  Back pain- (present on admission) Continue with oxycodone as needed  Diabetes mellitus, type II ([place]) Continue with Victoza, glipizide.  Sliding scale insulin.  COPD with acute exacerbation ([place])- (present on admission) Patient presented with worsening shortness of breath, cough and hypoxia. In the setting of current smoker, counseling provided. Continue to schedule albuterol/ipratropium. He still wheezing and have a lot  of rhonchorous, mild work of breathing. We will continue with IV Solu-Medrol.  Will add Pulmicort and Brovana. Schedule Guaifenesin.  Change Levaquin to azithromycin.  GERD (gastroesophageal reflux disease)- (present on admission) Continue with Protonix        Subjective:  Patient report worsening cough and shortness of breath over the last week.  She continues to smoke. She reports productive greenish sputum  Physical Exam: Vitals:   06/03/21 0014 06/03/21 0533 06/03/21 0904 06/03/21 1214  BP: (!) 120/54 129/61    Pulse: 82 84 68 72  Resp: $Remo'17 17 20 18  'IVloa$ Temp: 98 F (36.7 C) 98 F (36.7 C)    TempSrc: Oral Oral    SpO2: 99% 96% 95% 92%   General: Patient alert, speaking full sentences, on 4 L of oxygen Lungs: Bilateral wheezing and rhonchus, mild tachypnea Cardio Vascular: S1-S2 regular rhythm and rate Extremity: No edema  Data Reviewed:  CBC and B-met   review, chest x-ray review  Family Communication: Care discussed with patient  Disposition: Status is: Inpatient Remains inpatient appropriate because: Patient admitted with acute hypoxic respiratory failure secondary to COPD exacerbation and still requiring 4 L of oxygen, IV steroids          Planned Discharge Destination: Home     Time spent: 45 minutes  Author: Elmarie Shiley, MD 06/03/2021 1:22 PM  For on call review [email or web address].

## 2021-06-03 NOTE — Progress Notes (Signed)
RT instructed patient on the use of the flutter valve. Patient was able to demonstrate back good technique with a productive cough.

## 2021-06-03 NOTE — Assessment & Plan Note (Addendum)
Patient presented with worsening shortness of breath, cough and hypoxia. In the setting of current smoker, counseling provided. Continue to schedule albuterol/ipratropium. Treated  with IV Solu-Medrol,  Pulmicort and Brovana. Continue with Guaifenesin.  Received 5 days of antibiotics.  She is feeling better, lung sounds better. Plan to discharge on prednisone taper for 5 days , Advair and duoneb.

## 2021-06-03 NOTE — Assessment & Plan Note (Signed)
Counseling provided

## 2021-06-03 NOTE — Assessment & Plan Note (Addendum)
Continue with Victoza, glipizide.  Sliding scale insulin. Uncontrolled Diabetes type 2, hyperglycemia in setting of steroids.  Started  Semeglee. She will use semeglee while on prednisone.

## 2021-06-03 NOTE — Assessment & Plan Note (Signed)
Continue with CPAP

## 2021-06-04 LAB — BASIC METABOLIC PANEL
Anion gap: 6 (ref 5–15)
BUN: 21 mg/dL — ABNORMAL HIGH (ref 6–20)
CO2: 30 mmol/L (ref 22–32)
Calcium: 9.3 mg/dL (ref 8.9–10.3)
Chloride: 101 mmol/L (ref 98–111)
Creatinine, Ser: 0.73 mg/dL (ref 0.44–1.00)
GFR, Estimated: >60 mL/min (ref >60)
Glucose, Bld: 252 mg/dL — ABNORMAL HIGH (ref 70–99)
Potassium: 5 mmol/L (ref 3.5–5.1)
Sodium: 137 mmol/L (ref 135–145)

## 2021-06-04 LAB — PROCALCITONIN: Procalcitonin: <0.1 ng/mL

## 2021-06-04 LAB — GLUCOSE, CAPILLARY
Glucose-Capillary: 258 mg/dL — ABNORMAL HIGH (ref 70–99)
Glucose-Capillary: 122 mg/dL — ABNORMAL HIGH (ref 70–99)
Glucose-Capillary: 282 mg/dL — ABNORMAL HIGH (ref 70–99)
Glucose-Capillary: 197 mg/dL — ABNORMAL HIGH (ref 70–99)

## 2021-06-04 MED ORDER — INSULIN GLARGINE-YFGN 100 UNIT/ML ~~LOC~~ SOLN
15.0000 [IU] | Freq: Every day | SUBCUTANEOUS | Status: DC
  Administered 2021-06-04 – 2021-06-06 (×3): 15 [IU] via SUBCUTANEOUS
  Filled 2021-06-04 (×3): qty 0.15

## 2021-06-04 MED ORDER — GUAIFENESIN ER 600 MG PO TB12
1200.0000 mg | ORAL_TABLET | Freq: Two times a day (BID) | ORAL | Status: DC
  Administered 2021-06-04 – 2021-06-06 (×5): 1200 mg via ORAL
  Filled 2021-06-04 (×5): qty 2

## 2021-06-04 MED ORDER — IPRATROPIUM-ALBUTEROL 0.5-2.5 (3) MG/3ML IN SOLN
3.0000 mL | Freq: Three times a day (TID) | RESPIRATORY_TRACT | Status: DC
  Administered 2021-06-04 – 2021-06-06 (×6): 3 mL via RESPIRATORY_TRACT
  Filled 2021-06-04 (×6): qty 3

## 2021-06-04 MED ORDER — ENOXAPARIN SODIUM 60 MG/0.6ML IJ SOSY
60.0000 mg | PREFILLED_SYRINGE | INTRAMUSCULAR | Status: DC
  Administered 2021-06-04 – 2021-06-05 (×2): 60 mg via SUBCUTANEOUS
  Filled 2021-06-04 (×2): qty 0.6

## 2021-06-04 NOTE — Progress Notes (Signed)
Progress Note   Patient: [name] AQT:[id number] DOB: 31-Dec-1965 DOA: 06/02/2021     1 DOS: the patient was seen and examined on 06/04/2021   Brief hospital course: 56 year old past medical history significant for ADHD, alcohol abuse,anemia, anxiety, chronic back pain, history of leg edema, constipation, polysubstance abuse, GERD, gastric erosion, hiatal hernia, hypertension, diabetes type 2 neuropathy, asthma COPD who presents complaining of worsening shortness of breath for the last week.  She uses oxygen at home on as-needed basis, she is still smoking.  She reported worsening shortness of breath and cough for the last 2 weeks.  Her oxygen dropped to the 70s at home.  In the ED she was found to be hypoxic oxygen saturation 85 on room air, chest x-ray showed bronchitic changes.  She was admitted for COPD exacerbation.  Assessment and Plan: * Acute respiratory failure with hypoxia ([place])- (present on admission) Patient presented with worsening shortness of breath and cough, found to be hypoxic oxygen saturation 85 on room air. Secondary to asthma/COPD exacerbation Counseled about smoking cessation. Continue with nebulizer. Wean off oxygen as possible.  Down to 2 L oxygen.  Continue with current treatment  Tobacco use disorder-current smoker greater than 40-pack-year history- since age 27 1 ppd- (present on admission) Counseling provided  Adult attention deficit hyperactivity disorder- (present on admission) Continue with Adderall.  OSA on CPAP Continue with CPAP  Back pain- (present on admission) Continue with oxycodone as needed  Diabetes mellitus, type II ([place]) Continue with Victoza, glipizide.  Sliding scale insulin. Uncontrolled Diabetes type 2, hyperglycemia in setting of steroids.  Will add Semeglee.   COPD with acute exacerbation ([place])- (present on admission) Patient presented with worsening shortness of breath, cough and hypoxia. In the setting of current  smoker, counseling provided. Continue to schedule albuterol/ipratropium. Continue with IV Solu-Medrol,  Pulmicort and Brovana. Increase dose of  Guaifenesin.  Continue with  Azithromycin. Still wheezing, no breathing at baseline. Continue with IV steroids.   GERD (gastroesophageal reflux disease)- (present on admission) Continue with Protonix        Subjective: she is not breathing at baseline. Still with cough and SOB  Physical Exam: Vitals:   06/03/21 2151 06/04/21 0438 06/04/21 0824 06/04/21 1324  BP: 132/71 128/62  140/69  Pulse: 73 62  70  Resp: $Remo'18 14  18  'FGXdT$ Temp: 97.7 F (36.5 C) 97.9 F (36.6 C)  98 F (36.7 C)  TempSrc: Oral Oral  Oral  SpO2: 92% 92% 92% 94%  Weight:      Height:       General; NAD CVS; S 1, S 2 RRR Lung; BL ronchus , wheezing.   Data Reviewed:  B-met reviewed.   Family Communication: care discussed with patient.   Disposition: Status is: Inpatient Remains inpatient appropriate because: requiring oxygen supplementation, Still with wheezing.           Planned Discharge Destination: Home     Time spent: 45 minutes  Author: Elmarie Shiley, MD 06/04/2021 1:47 PM  For on call review [email or web address].

## 2021-06-05 LAB — GLUCOSE, CAPILLARY
Glucose-Capillary: 209 mg/dL — ABNORMAL HIGH (ref 70–99)
Glucose-Capillary: 225 mg/dL — ABNORMAL HIGH (ref 70–99)
Glucose-Capillary: 214 mg/dL — ABNORMAL HIGH (ref 70–99)
Glucose-Capillary: 216 mg/dL — ABNORMAL HIGH (ref 70–99)

## 2021-06-05 LAB — BASIC METABOLIC PANEL
Anion gap: 9 (ref 5–15)
BUN: 23 mg/dL — ABNORMAL HIGH (ref 6–20)
CO2: 29 mmol/L (ref 22–32)
Calcium: 8.9 mg/dL (ref 8.9–10.3)
Chloride: 99 mmol/L (ref 98–111)
Creatinine, Ser: 0.82 mg/dL (ref 0.44–1.00)
GFR, Estimated: >60 mL/min (ref >60)
Glucose, Bld: 292 mg/dL — ABNORMAL HIGH (ref 70–99)
Potassium: 4.1 mmol/L (ref 3.5–5.1)
Sodium: 137 mmol/L (ref 135–145)

## 2021-06-05 NOTE — Assessment & Plan Note (Signed)
Resolved

## 2021-06-05 NOTE — Progress Notes (Signed)
Progress Note   Patient: [name] TRR:[id number] DOB: 1965-09-29 DOA: 06/02/2021     2 DOS: the patient was seen and examined on 06/05/2021   Brief hospital course: 56 year old past medical history significant for ADHD, alcohol abuse,anemia, anxiety, chronic back pain, history of leg edema, constipation, polysubstance abuse, GERD, gastric erosion, hiatal hernia, hypertension, diabetes type 2 neuropathy, asthma COPD who presents complaining of worsening shortness of breath for the last week.  She uses oxygen at home on as-needed basis, she is still smoking.  She reported worsening shortness of breath and cough for the last 2 weeks.  Her oxygen dropped to the 70s at home.  In the ED she was found to be hypoxic oxygen saturation 85 on room air, chest x-ray showed bronchitic changes.  She was admitted for COPD exacerbation.  Assessment and Plan: * Acute respiratory failure with hypoxia ([place])- (present on admission) Patient presented with worsening shortness of breath and cough, found to be hypoxic oxygen saturation 85 on room air. Secondary to asthma/COPD exacerbation Counseled about smoking cessation. Continue with nebulizer. Wean off oxygen as possible.  Down to 2 L oxygen.  Continue with current treatment Still with BL wheezing, ronchus. One more day of IV steroids. Hopefully home tomorrow.   Tobacco use disorder-current smoker greater than 40-pack-year history- since age 21 1 ppd- (present on admission) Counseling provided  Adult attention deficit hyperactivity disorder- (present on admission) Continue with Adderall.  OSA on CPAP Continue with CPAP  Hypokalemia Resolved.   Back pain- (present on admission) Continue with oxycodone as needed  Diabetes mellitus, type II ([place]) Continue with Victoza, glipizide.  Sliding scale insulin. Uncontrolled Diabetes type 2, hyperglycemia in setting of steroids.  Started  Semeglee.   COPD with acute exacerbation ([place])-  (present on admission) Patient presented with worsening shortness of breath, cough and hypoxia. In the setting of current smoker, counseling provided. Continue to schedule albuterol/ipratropium. Continue with IV Solu-Medrol,  Pulmicort and Brovana. Continue with Guaifenesin.  Continue with  Azithromycin. One more day of IV steroids, home tomorrow.   GERD (gastroesophageal reflux disease)- (present on admission) Continue with Protonix        Subjective:  She is breathing better, still with productive cough, not breathing at baseline  Physical Exam: Vitals:   06/05/21 0403 06/05/21 0745 06/05/21 0748 06/05/21 1331  BP: (!) 133/55   140/71  Pulse: 80   84  Resp: 20   18  Temp: 97.7 F (36.5 C)   98.3 F (36.8 C)  TempSrc: Oral   Oral  SpO2: 98% 92% 92% 91%  Weight:      Height:       General ; NAD Lung; BL ronchus, less wheezing.  Abdomen; BS present, soft, nt  Data Reviewed:  B-met order and pending  Family Communication: Care discussed with patient  Disposition: Status is: Inpatient Remains inpatient appropriate because: management COPD exacerbation and acute hypoxic respiratory failure          Planned Discharge Destination: Home     Time spent: 45 minutes  Author: Elmarie Shiley, MD 06/05/2021 2:51 PM  For on call review [email or web address].

## 2021-06-06 ENCOUNTER — Other Ambulatory Visit: Payer: Self-pay

## 2021-06-06 ENCOUNTER — Other Ambulatory Visit: Payer: Self-pay | Admitting: Cardiology

## 2021-06-06 ENCOUNTER — Other Ambulatory Visit: Payer: Self-pay | Admitting: Family Medicine

## 2021-06-06 DIAGNOSIS — J9601 Acute respiratory failure with hypoxia: Secondary | ICD-10-CM | POA: Diagnosis not present

## 2021-06-06 DIAGNOSIS — E1165 Type 2 diabetes mellitus with hyperglycemia: Secondary | ICD-10-CM

## 2021-06-06 LAB — GLUCOSE, CAPILLARY: Glucose-Capillary: 254 mg/dL — ABNORMAL HIGH (ref 70–99)

## 2021-06-06 MED ORDER — PANTOPRAZOLE SODIUM 40 MG PO TBEC: 40.0000 mg | DELAYED_RELEASE_TABLET | Freq: Two times a day (BID) | ORAL | 1 refills | Status: DC

## 2021-06-06 MED ORDER — DM-GUAIFENESIN ER 30-600 MG PO TB12: 1.0000 | ORAL_TABLET | Freq: Two times a day (BID) | ORAL | 0 refills | Status: DC | PRN

## 2021-06-06 MED ORDER — IPRATROPIUM-ALBUTEROL 0.5-2.5 (3) MG/3ML IN SOLN: 3.0000 mL | Freq: Three times a day (TID) | RESPIRATORY_TRACT | 0 refills | Status: DC

## 2021-06-06 MED ORDER — NICOTINE 21 MG/24HR TD PT24: 21.0000 mg | MEDICATED_PATCH | Freq: Every day | TRANSDERMAL | 0 refills | Status: DC

## 2021-06-06 MED ORDER — GUAIFENESIN ER 600 MG PO TB12: 1200.0000 mg | ORAL_TABLET | Freq: Two times a day (BID) | ORAL | 0 refills | Status: DC

## 2021-06-06 MED ORDER — INSULIN GLARGINE-YFGN 100 UNIT/ML ~~LOC~~ SOLN: 15.0000 [IU] | Freq: Every day | SUBCUTANEOUS | 11 refills | Status: DC

## 2021-06-06 MED ORDER — PREDNISONE 20 MG PO TABS: 40.0000 mg | ORAL_TABLET | Freq: Every day | ORAL | 0 refills | Status: AC

## 2021-06-06 MED ORDER — ADVAIR HFA 45-21 MCG/ACT IN AERO: 2.0000 | INHALATION_SPRAY | Freq: Two times a day (BID) | RESPIRATORY_TRACT | 12 refills | Status: DC

## 2021-06-06 MED ORDER — ATORVASTATIN CALCIUM 20 MG PO TABS: 20.0000 mg | ORAL_TABLET | Freq: Two times a day (BID) | ORAL | 3 refills | Status: DC

## 2021-06-06 NOTE — Discharge Summary (Signed)
Physician Discharge Summary   Patient: [name] MRN: [id number] DOB: Sep 24, 1965  Admit date:     06/02/2021  Discharge date: 06/06/21  Discharge Physician: Elmarie Shiley   PCP: Haydee Salter, MD   Recommendations at discharge:   Encourage smoking cessation.  Adjust diabetes medications as needed.  Needs to follow up with primary pulmonologist.   Discharge Diagnoses: Principal Problem:   Acute respiratory failure with hypoxia ([place]) Active Problems:   GERD (gastroesophageal reflux disease)   COPD with acute exacerbation ([place])   Diabetes mellitus, type II ([place])   Back pain   Hypokalemia   OSA on CPAP   Adult attention deficit hyperactivity disorder   Tobacco use disorder-current smoker greater than 40-pack-year history- since age 12 1 ppd  Resolved Problems:   * No resolved hospital problems. *   Hospital Course: 56 year old past medical history significant for ADHD, alcohol abuse,anemia, anxiety, chronic back pain, history of leg edema, constipation, polysubstance abuse, GERD, gastric erosion, hiatal hernia, hypertension, diabetes type 2 neuropathy, asthma COPD who presents complaining of worsening shortness of breath for the last week.  She uses oxygen at home on as-needed basis, she is still smoking.  She reported worsening shortness of breath and cough for the last 2 weeks.  Her oxygen dropped to the 70s at home.  In the ED she was found to be hypoxic oxygen saturation 85 on room air, chest x-ray showed bronchitic changes.  She was admitted for COPD exacerbation. Treated with IV steroids, nebulizer. She improved, wean off oxygen. She will be discharge home with prednisone for 5 days.   Assessment and Plan: * Acute respiratory failure with hypoxia ([place])- (present on admission) Patient presented with worsening shortness of breath and cough, found to be hypoxic oxygen saturation 85 on room air. Secondary to asthma/COPD exacerbation Counseled about smoking  cessation. Treated  with nebulizer. initially required 4 L oxygen, now down to RA She if feeling better, plan to discharge home today with prednisone taper. Resume as needed oxygen at home.   Tobacco use disorder-current smoker greater than 40-pack-year history- since age 30 1 ppd- (present on admission) Counseling provided  Adult attention deficit hyperactivity disorder- (present on admission) Continue with Adderall.  OSA on CPAP Continue with CPAP  Hypokalemia Resolved.   Back pain- (present on admission) Continue with oxycodone as needed  Diabetes mellitus, type II ([place]) Continue with Victoza, glipizide.  Sliding scale insulin. Uncontrolled Diabetes type 2, hyperglycemia in setting of steroids.  Started  Semeglee. She will use semeglee while on prednisone.   COPD with acute exacerbation ([place])- (present on admission) Patient presented with worsening shortness of breath, cough and hypoxia. In the setting of current smoker, counseling provided. Continue to schedule albuterol/ipratropium. Treated  with IV Solu-Medrol,  Pulmicort and Brovana. Continue with Guaifenesin.  Received 5 days of antibiotics.  She is feeling better, lung sounds better. Plan to discharge on prednisone taper for 5 days , Advair and duoneb.   GERD (gastroesophageal reflux disease)- (present on admission) Continue with Protonix           Consultants: None.  Procedures performed: none  Disposition: Home Diet recommendation:  Discharge Diet Orders (From admission, onward)     Start     Ordered   06/06/21 0000  Diet Carb Modified        06/06/21 0933           Carb modified diet  DISCHARGE MEDICATION: Allergies as of 06/06/2021  Reactions   Codeine Itching, Nausea Only   Metformin And Related Diarrhea, Other (See Comments)   Stomach pain/nausea   Wellbutrin [bupropion Hcl] Hives   Acyclovir And Related Rash        Medication List     STOP taking these medications     dextromethorphan 30 MG/5ML liquid Commonly known as: DELSYM       TAKE these medications    Accu-Chek Guide test strip Generic drug: glucose blood USE UP TO FOUR TIMES DAILY AS DIRECTED   Accu-Chek Softclix Lancets lancets USE UP TO FOUR TIMES DAILY AS DIRECTED   Advair HFA 45-21 MCG/ACT inhaler Generic drug: fluticasone-salmeterol Inhale 2 puffs into the lungs 2 (two) times daily.   albuterol 108 (90 Base) MCG/ACT inhaler Commonly known as: VENTOLIN HFA Inhale 2 puffs into the lungs every 6 (six) hours as needed for wheezing or shortness of breath.   albuterol (2.5 MG/3ML) 0.083% nebulizer solution Commonly known as: PROVENTIL Take 3 mLs (2.5 mg total) by nebulization every 6 (six) hours as needed for wheezing or shortness of breath.   amphetamine-dextroamphetamine 30 MG tablet Commonly known as: Adderall Take 1 tablet by mouth 2 (two) times daily. What changed: Another medication with the same name was removed. Continue taking this medication, and follow the directions you see here.   ARIPiprazole 5 MG tablet Commonly known as: ABILIFY Take 1 tablet (5 mg total) by mouth daily.   atorvastatin 20 MG tablet Commonly known as: LIPITOR Take 20 mg by mouth 2 (two) times daily.   BD Pen Needle Nano 2nd Gen 32G X 4 MM Misc Generic drug: Insulin Pen Needle USE DAILY WITH VICTOZA   blood glucose meter kit and supplies Dispense based on patient and insurance preference. Use up to four times daily as directed. (FOR ICD-10 E10.9, E11.9).   clonazePAM 0.5 MG tablet Commonly known as: KLONOPIN Take 1 tablet (0.5 mg total) by mouth 2 (two) times daily as needed for anxiety. TAKE 1 TABLET(0.5 MG) BY MOUTH TWICE DAILY What changed: additional instructions   dapagliflozin propanediol 10 MG Tabs tablet Commonly known as: Farxiga Take 1 tablet (10 mg total) by mouth daily.   dextromethorphan-guaiFENesin 30-600 MG 12hr tablet Commonly known as: MUCINEX DM Take 1 tablet by  mouth 2 (two) times daily as needed for cough.   DULoxetine 60 MG capsule Commonly known as: CYMBALTA TAKE 1 CAPSULE(60 MG) BY MOUTH DAILY What changed: See the new instructions.   fluticasone 50 MCG/ACT nasal spray Commonly known as: FLONASE INSTILL 1 SPRAY INTO EACH NOSTRIL TWICE DAILY AFTER SINUS RINSES. PLEASE SCHEDULE APPOINTMENT FOR FURTHER REFILLS What changed:  how much to take when to take this reasons to take this additional instructions   furosemide 40 MG tablet Commonly known as: LASIX AS NEEDED for weight increase of 3 lbs overnight or 5 lbs in 1 week What changed:  how much to take how to take this when to take this reasons to take this   glipiZIDE 5 MG tablet Commonly known as: GLUCOTROL TAKE 1 TABLET(5 MG) BY MOUTH TWICE DAILY BEFORE A MEAL What changed: See the new instructions.   guaiFENesin 600 MG 12 hr tablet Commonly known as: MUCINEX Take 2 tablets (1,200 mg total) by mouth 2 (two) times daily.   ibuprofen 200 MG tablet Commonly known as: ADVIL Take 200 mg by mouth every 6 (six) hours as needed for mild pain.   insulin glargine-yfgn 100 UNIT/ML injection Commonly known as: SEMGLEE Inject 0.15 mLs (15  Units total) into the skin daily. Start taking on: June 07, 2021   ipratropium-albuterol 0.5-2.5 (3) MG/3ML Soln Commonly known as: DUONEB Take 3 mLs by nebulization 3 (three) times daily.   isosorbide mononitrate 30 MG 24 hr tablet Commonly known as: IMDUR TAKE 1 TABLET(30 MG) BY MOUTH DAILY. MAKE APPOINTMENT WITH PROVIDER BEFORE FURTHER REFILLS- FIRST ATTEMPT What changed: See the new instructions.   losartan 25 MG tablet Commonly known as: COZAAR TAKE 1 TABLET(25 MG) BY MOUTH DAILY What changed: See the new instructions.   meloxicam 15 MG tablet Commonly known as: MOBIC TAKE 1 TABLET(15 MG) BY MOUTH DAILY What changed: See the new instructions.   metoprolol tartrate 25 MG tablet Commonly known as: LOPRESSOR TAKE 1 TABLET(25 MG)  BY MOUTH TWICE DAILY What changed: See the new instructions.   nicotine 21 mg/24hr patch Commonly known as: NICODERM CQ - dosed in mg/24 hours Place 1 patch (21 mg total) onto the skin daily. Start taking on: June 07, 2021   nystatin powder Commonly known as: MYCOSTATIN/NYSTOP Apply 1 application topically 2 (two) times daily. What changed:  when to take this reasons to take this   OXYGEN Inhale 2 L into the lungs daily as needed (low ox levels).   pantoprazole 40 MG tablet Commonly known as: PROTONIX Take 1 tablet (40 mg total) by mouth 2 (two) times daily. What changed: when to take this   predniSONE 20 MG tablet Commonly known as: DELTASONE Take 2 tablets (40 mg total) by mouth daily with breakfast for 5 days.   pregabalin 150 MG capsule Commonly known as: LYRICA Take 1 capsule (150 mg total) by mouth 2 (two) times daily.   Victoza 18 MG/3ML Sopn Generic drug: liraglutide Inject 1.8 mg into the skin daily.        Follow-up Information     Haydee Salter, MD Follow up in 1 week(s).   Specialty: Family Medicine Contact information: [place] [address] [phone]         Rigoberto Noel, MD Follow up in 1 week(s).   Specialty: Pulmonary Disease Contact information: [address] [place] [address] [phone]         Rigoberto Noel, MD .   Specialty: Pulmonary Disease Contact information: [place] [address] [phone]                 Discharge Exam: Danley Danker Weights   06/03/21 1559  Weight: 122.9 kg  General; NAD Lung; No wheezing, less ronchus.   Condition at discharge: stable  The results of significant diagnostics from this hospitalization (including imaging, microbiology, ancillary and laboratory) are listed below for reference.   Imaging Studies: DG Chest 2 View  Result Date: 06/02/2021 CLINICAL DATA:  Shortness of breath for several days.  Central chest pain. Cough for the past 2 weeks. Smoker. Diabetes. EXAM: CHEST - 2 VIEW COMPARISON:  05/31/2021 FINDINGS: Normal sized heart. Aortic arch calcifications. Stable mild diffuse peribronchial thickening and accentuation of the interstitial markings. Normal vascularity. No airspace consolidation. Mild thoracic spine degenerative changes. IMPRESSION: Stable chronic bronchitic changes.  No acute abnormality. Electronically Signed   By: Claudie Revering M.D.   On: 06/02/2021 11:08   DG Chest 2 View  Result Date: 05/31/2021 CLINICAL DATA:  Cough, shortness of breath, congestion, and chest pain for 1 month, history of asthmatic bronchitis, COPD, diabetes mellitus, hypertension EXAM: CHEST - 2 VIEW COMPARISON:  05/13/2021 FINDINGS:  Normal heart size, mediastinal contours, and pulmonary vascularity. Atherosclerotic calcification aorta. Chronic bronchitic changes and accentuation of perihilar markings similar to previous exam. Mild subsegmental atelectasis at lung bases. No acute consolidation, pleural effusion, or pneumothorax. Bones demineralized. IMPRESSION: Chronic bronchitic changes and bibasilar atelectasis. No acute abnormalities. Aortic Atherosclerosis (ICD10-I70.0). Electronically Signed   By: Lavonia Dana M.D.   On: 05/31/2021 14:57   DG Chest 2 View  Result Date: 05/13/2021 CLINICAL DATA:  Cough EXAM: CHEST - 2 VIEW COMPARISON:  02/19/2020 FINDINGS: Cardiac and mediastinal contours are within normal limits. No focal pulmonary opacity. No pleural effusion or pneumothorax. No acute osseous abnormality. IMPRESSION: No acute cardiopulmonary process. Electronically Signed   By: Merilyn Baba M.D.   On: 05/13/2021 12:39    Microbiology: Results for orders placed or performed during the hospital encounter of 06/02/21  Resp Panel by RT-PCR (Flu A&B, Covid) Nasopharyngeal Swab     Status: None   Collection Time: 06/02/21 11:27 AM   Specimen: Nasopharyngeal Swab; Nasopharyngeal(NP) swabs in vial transport  medium  Result Value Ref Range Status   SARS Coronavirus 2 by RT PCR NEGATIVE NEGATIVE Final    Comment: (NOTE) SARS-CoV-2 target nucleic acids are NOT DETECTED.  The SARS-CoV-2 RNA is generally detectable in upper respiratory specimens during the acute phase of infection. The lowest concentration of SARS-CoV-2 viral copies this assay can detect is 138 copies/mL. A negative result does not preclude SARS-Cov-2 infection and should not be used as the sole basis for treatment or other patient management decisions. A negative result may occur with  improper specimen collection/handling, submission of specimen other than nasopharyngeal swab, presence of viral mutation(s) within the areas targeted by this assay, and inadequate number of viral copies(<138 copies/mL). A negative result must be combined with clinical observations, patient history, and epidemiological information. The expected result is Negative.  Fact Sheet for Patients:  EntrepreneurPulse.com.au  Fact Sheet for Healthcare Providers:  IncredibleEmployment.be  This test is no t yet approved or cleared by the [place] FDA and  has been authorized for detection and/or diagnosis of SARS-CoV-2 by FDA under an Emergency Use Authorization (EUA). This EUA will remain  in effect (meaning this test can be used) for the duration of the COVID-19 declaration under Section 564(b)(1) of the Act, 21 U.S.C.section 360bbb-3(b)(1), unless the authorization is terminated  or revoked sooner.       Influenza A by PCR NEGATIVE NEGATIVE Final   Influenza B by PCR NEGATIVE NEGATIVE Final    Comment: (NOTE) The Xpert Xpress SARS-CoV-2/FLU/RSV plus assay is intended as an aid in the diagnosis of influenza from Nasopharyngeal swab specimens and should not be used as a sole basis for treatment. Nasal washings and aspirates are unacceptable for Xpert Xpress SARS-CoV-2/FLU/RSV testing.  Fact Sheet for  Patients: EntrepreneurPulse.com.au  Fact Sheet for Healthcare Providers: IncredibleEmployment.be  This test is not yet approved or cleared by the [place] FDA and has been authorized for detection and/or diagnosis of SARS-CoV-2 by FDA under an Emergency Use Authorization (EUA). This EUA will remain in effect (meaning this test can be used) for the duration of the COVID-19 declaration under Section 564(b)(1) of the Act, 21 U.S.C. section 360bbb-3(b)(1), unless the authorization is terminated or revoked.  Performed at [facility], [place] [address] [place], [place] [address]     Labs: CBC: Recent Labs  Lab 05/31/21 1355 06/02/21 1128 06/03/21 0535  WBC 10.5 11.1* 14.6*  NEUTROABS 8.0* 8.2*  --   HGB 13.0  14.5 13.7  HCT 41.9 47.1* 45.5  MCV 81.5 81.5 83.2  PLT 227 238 283   Basic Metabolic Panel: Recent Labs  Lab 05/31/21 1355 06/02/21 1128 06/03/21 0535 06/04/21 0530 06/05/21 1517  NA 139 136 137 137 137  K 3.5 3.4* 4.8 5.0 4.1  CL 104 99 101 101 99  CO2 _0 GLUCOSE 213* 208* 151* 252* 292*  BUN 5* 6 17 21* 23*  CREATININE 0.81 0.59 0.75 0.73 0.82  CALCIUM 9.0 8.8* 8.8* 9.3 8.9  PHOS  --   --  4.3  --   --    Liver Function Tests: Recent Labs  Lab 05/31/21 1355 06/03/21 0535  AST 18 17  ALT 18 20  ALKPHOS 59 56  BILITOT 0.1* 0.2*  PROT 6.7 7.2  ALBUMIN 3.3* 3.4*   CBG: Recent Labs  Lab 06/05/21 0745 06/05/21 1138 06/05/21 1641 06/05/21 1938 06/06/21 0722  GLUCAP 209* 225* 214* 216* 254*    Discharge time spent: greater than 30 minutes.  Signed: Elmarie Shiley, MD [facility] 06/06/2021

## 2021-06-06 NOTE — Progress Notes (Signed)
Inpatient Diabetes Program Recommendations  AACE/ADA: New Consensus Statement on Inpatient Glycemic Control (2015)  Target Ranges:  Prepandial:   less than 140 mg/dL      Peak postprandial:   less than 180 mg/dL (1-2 hours)      Critically ill patients:  140 - 180 mg/dL   Lab Results  Component Value Date   GLUCAP 254 (H) 06/06/2021   HGBA1C 7.8 (H) 06/02/2021    Review of Glycemic Control  Latest Reference Range & Units 06/05/21 16:41 06/05/21 19:38 06/06/21 07:22  Glucose-Capillary 70 - 99 mg/dL 214 (H) 216 (H) 254 (H)  (H): Data is abnormally high Diabetes history: Type 2 DM Outpatient Diabetes medications: Victoza 1.8 mg QD, Farxiga 10 mg QD, Glipizide 20 mg QD Current orders for Inpatient glycemic control: Victoza 1.8 mg QD, Farxiga 10 mg QD, Glipizide 20 mg QD, Novolog 0-20 units TID Solumedrol 60 mg BID  Inpatient Diabetes Program Recommendations:    Consider increasing Semglee to 20 units QD.   Thanks, Bronson Curb, MSN, RNC-OB Diabetes Coordinator [phone] (8a-5p)

## 2021-06-06 NOTE — Telephone Encounter (Signed)
Refill request for: Atorvastatin 20 mg  LR  HX provider LOV 03/14/21 FOV  06/16/20  Please review and advise.  Thanks. Dm/cma

## 2021-06-06 NOTE — Consult Note (Signed)
[facility] Inpatient Consult   06/06/2021  [name] 1966-01-18 [id number]  [facility] CM)  Patient chart reviewed with noted high risk score for unplanned readmission.    Primary Care Provider office offers chronic care management team and program, and is listed for the transition of care follow up and appointments.    Per review, patient is scheduled for transition to home.  Plan: Will make referral to primary care office for assessment and introduction to chronic care disease management and care coordination services.   Of note, [facility] Care Management services does not replace or interfere with any services that are arranged by inpatient case management or social work.   Netta Cedars, MSN, RN [facility] [employer] [phone]  Toll free office [phone]

## 2021-06-06 NOTE — Progress Notes (Signed)
PT Cancellation Note / Screen  Patient Details Name: [name] MRN: [id number] DOB: 11-Jul-1965   Cancelled Treatment:    Reason Eval/Treat Not Completed: PT screened, no needs identified, will sign off Pt reports she has been ambulating and looking at her pulse oximeter from home upon entering room.  Pt denies any current PT needs and states she is discharging home today.    Myrtis Hopping [place] 06/06/2021, 10:12 AM Arlyce Dice, DPT Acute Rehabilitation Services Pager: [phone] Office: [phone]

## 2021-06-07 ENCOUNTER — Other Ambulatory Visit: Payer: Self-pay

## 2021-06-07 ENCOUNTER — Other Ambulatory Visit (HOSPITAL_COMMUNITY): Payer: Self-pay | Admitting: Psychiatry

## 2021-06-07 ENCOUNTER — Telehealth (HOSPITAL_COMMUNITY): Payer: Self-pay | Admitting: *Deleted

## 2021-06-07 ENCOUNTER — Telehealth (INDEPENDENT_AMBULATORY_CARE_PROVIDER_SITE_OTHER): Payer: Medicare HMO | Admitting: Psychiatry

## 2021-06-07 ENCOUNTER — Encounter (HOSPITAL_COMMUNITY): Payer: Self-pay | Admitting: Psychiatry

## 2021-06-07 DIAGNOSIS — R69 Illness, unspecified: Secondary | ICD-10-CM | POA: Diagnosis not present

## 2021-06-07 DIAGNOSIS — F909 Attention-deficit hyperactivity disorder, unspecified type: Secondary | ICD-10-CM

## 2021-06-07 DIAGNOSIS — F331 Major depressive disorder, recurrent, moderate: Secondary | ICD-10-CM

## 2021-06-07 MED ORDER — AMPHETAMINE-DEXTROAMPHETAMINE 30 MG PO TABS: 30.0000 mg | ORAL_TABLET | Freq: Two times a day (BID) | ORAL | 0 refills | Status: DC

## 2021-06-07 MED ORDER — DULOXETINE HCL 60 MG PO CPEP: 60.0000 mg | ORAL_CAPSULE | Freq: Every day | ORAL | 2 refills | Status: DC

## 2021-06-07 MED ORDER — ARIPIPRAZOLE 5 MG PO TABS: 5.0000 mg | ORAL_TABLET | Freq: Every day | ORAL | 2 refills | Status: DC

## 2021-06-07 MED ORDER — CLONAZEPAM 0.5 MG PO TABS: 0.5000 mg | ORAL_TABLET | Freq: Two times a day (BID) | ORAL | 2 refills | Status: DC | PRN

## 2021-06-07 NOTE — Telephone Encounter (Signed)
Resent, please cancel Adderall script to [facility]

## 2021-06-07 NOTE — Telephone Encounter (Signed)
Called to let provider know that the only pharmacy that have her Adderall is [facility].

## 2021-06-07 NOTE — Progress Notes (Signed)
Virtual Visit via Telephone Note  I connected with [name] on 06/07/21 at  2:00 PM EST by telephone and verified that I am speaking with the correct person using two identifiers.  Location: Patient: home Provider: office   I discussed the limitations, risks, security and privacy concerns of performing an evaluation and management service by telephone and the availability of in person appointments. I also discussed with the patient that there may be a patient responsible charge related to this service. The patient expressed understanding and agreed to proceed.      I discussed the assessment and treatment plan with the patient. The patient was provided an opportunity to ask questions and all were answered. The patient agreed with the plan and demonstrated an understanding of the instructions.   The patient was advised to call back or seek an in-person evaluation if the symptoms worsen or if the condition fails to improve as anticipated.  I provided 15 minutes of non-face-to-face time during this encounter.   Sheila Spiller, MD  [facility] MD/PA/NP OP Progress Note  06/07/2021 2:28 PM [name]  MRN:  [id number]  Chief Complaint:  Chief Complaint  Patient presents with   Depression   Anxiety   ADD   Follow-up   HPI: This patient is a 56 year old married white female who lives with her husband in [place].  She is on disability.  The patient returns for follow-up regarding her depression anxiety and attention deficit disorder.  She was last seen 3 months ago.  Unfortunately she was rehospitalized last week for COPD exacerbation.  She required oxygen nebulizer treatments etc. as well as steroids.  She is feeling better now.  She admits that she was smoking heavily up to about 2 packs a day prior to admission.  She is now down to 10 cigarettes a day.  She does not sound as if she is ready to totally quit.  The patient states that her mood has remained stable  despite the stressors.  She denies significant depression or anxiety.  She is sleeping well.  She is slowly starting to regain her energy.  She denies any thoughts of self-harm or suicidal ideation Visit Diagnosis:    ICD-10-CM   1. Moderate episode of recurrent major depressive disorder (HCC)  F33.1     2. Adult attention deficit hyperactivity disorder  F90.9 amphetamine-dextroamphetamine (ADDERALL) 30 MG tablet      Past Psychiatric History: The patient was hospitalized twice in her 13s and again in 2006 and 2020 for drug overdose with suicidal intent  Past Medical History:  Past Medical History:  Diagnosis Date   ADHD    Alcohol abuse    Anemia    Anxiety    Asthmatic bronchitis    normal PFT/ seen by pulmonary no evidence of COPD   Atypical chest pain 10/05/2017   Back pain    Bilateral swelling of feet    Chest pain    Chest pain on respiration 03/25/2014   Chronic bronchitis ([place])    Chronic lower back pain    Chronic respiratory failure ([place]) 10/16/2011   Newly 02 dep 24/7 p discharge [facility] 01/2013  - 03/13/2014  Walked RA  2 laps @ 185 ft each stopped due to  Sob/ aching in legs, thirsty/ no desat @ slow pace    Chronic respiratory failure with hypoxia (HCC)    On 2-3 L of oxygen at home   Cigarette smoker 12/02/2010   Followed in Pulmonary clinic/  [facility]/ [place]   - Limits of effective care reviewed 1/60/7371     Complication of anesthesia    Constipation    COPD (chronic obstructive pulmonary disease) (HCC)    COPD with chronic bronchitis ([place]) 10/01/2017   Daily headache    Depression    Diabetic peripheral neuropathy ([place])    DM (diabetes mellitus) type II controlled, neurological manifestation ([place]) 12/02/2010   Drug use    Gallbladder problem    Gastric erosions    EGD 08/2010.   GERD (gastroesophageal reflux disease)    Glaucoma    H/O drug abuse ([place]) 11/12/2017   -- scanned document from outside source: [facility] in [place] which showed UDS positive for Adderall /amphetamine usage as well as positive urine for THC.  This test result was collected 08/11/2016 and reported 10/07/2016. - lso review of the chart shows another positive amphetamine, THC and METH in the urine back on 10/18/2015 under "care everywhere". ---So    Heavy menses    High cholesterol    History of blood transfusion    "related to low HgB" (10/05/2017)   History of hiatal hernia    HTN (hypertension)    Hyperlipidemia associated with type 2 diabetes mellitus ([place]) 10/18/2015   Hypertension associated with diabetes ([place]) 12/02/2010   D/c acei 12/22/2011 due to psuedowheeze and narcotic dependent cough> ? Improved - 10/18/9483 started bystolic in place of cozar due to cough     IBS (irritable bowel syndrome)    Increased urinary protein excretion    Internal hemorrhoids    Colonoscopy 5/12.   Iron deficiency anemia 10/01/2017   Joint pain    Mixed diabetic hyperlipidemia associated with type 2 diabetes mellitus ([place]) 10/01/2017   On home oxygen therapy    "5L at night" (10/05/2017)   OSA on CPAP    Osteoarthritis    "back" (10/05/2017)   Oxygen dependent 10/16/2011   Pneumonia    "lots of times" (10/05/2017)   PONV (postoperative nausea and vomiting)    Poorly controlled diabetes mellitus ([place]) 11/12/2017   Pulmonary infiltrates 12/22/2011   Followed in Pulmonary clinic/ [facility]/ [place]    - See CT Chest 05/05/11    Shortness of breath    Tachycardia    never had test done since no insurance   Tobacco use disorder-current smoker greater than 40-pack-year history- since age 69 1 ppd 10/01/2017   Type II diabetes mellitus ([place])    Vitamin D deficiency     Past Surgical History:  Procedure Laterality Date   CESAREAN SECTION  1988; [place]   COLONOSCOPY  09/16/2010   IOE:VOJJKK colon/small internal hemorrhoids   ESOPHAGOGASTRODUODENOSCOPY  09/16/2010   SLF: normal/mild gastritis    ESOPHAGOGASTRODUODENOSCOPY N/A 10/19/2014   Procedure: ESOPHAGOGASTRODUODENOSCOPY (EGD);  Surgeon: Danie Binder, MD;  Location: [facility] END[facility] SU[facility];  Service: Endoscopy;  Laterality: N/A;  [place]  01/17/2012   Procedure: HYSTEROSCOPY WITH THERMACHOICE;  Surgeon: Florian Buff, MD;  Location: [facility] ORS;  Service: Gynecology;  Laterality: N/A;  total therapy time: 9:13sec  D5W  18 ml in, D5W   58m out, temperture 87degrees celcious   KIDNEY SURGERY     as child for blockages   LEFT HEART CATH AND CORONARY ANGIOGRAPHY N/A 09/08/2019   Procedure: LEFT HEART CATH AND CORONARY ANGIOGRAPHY;  Surgeon: JMartinique Peter M, MD;  Location:  [facility]IVE [facility];  Service: Cardiovascular;  Laterality: N/A;   TONSILLECTOMY     TUBAL LIGATION  1989   TYMPANOSTOMY TUBE PLACEMENT Bilateral    "several times when I was a child"   uterine ablation     WRIST FRACTURE SURGERY Left 1995    Family Psychiatric History: see below  Family History:  Family History  Problem Relation Age of Onset   Heart attack Mother 49       deceased   Diabetes Mother    Breast cancer Mother 64       inflammatory breast ca   Heart failure Mother        oxygen dependence, nonsmoker   Heart disease Mother    Depression Mother    Cancer Mother        Breast   Hypertension Mother    Hyperlipidemia Mother    Sudden death Mother    Sleep apnea Mother    Obesity Mother    Heart attack Father 49       deceased, etoh use   Heart disease Father    Alcohol abuse Father    Depression Father    Heart failure Father    Sudden death Father    Ulcers Sister    Hypertension Sister    Heart failure Sister    Depression Sister    Anxiety disorder Sister    Liver disease Maternal Aunt 26       died while on liver transplant list   Liver disease Maternal Uncle    Cancer Paternal Aunt        Breast   Heart disease Maternal Grandmother    Heart attack Maternal Grandmother        premature  CAD   Colon cancer Neg Hx     Social History:  Social History   Socioeconomic History   Marital status: Married    Spouse name: Not on file   Number of children: 2   Years of education: Not on file   Highest education level: Not on file  Occupational History   Occupation: unemployed  Tobacco Use   Smoking status: Every Day    Packs/day: 1.50    Years: 40.00    Pack years: 60.00    Types: Cigarettes, E-cigarettes    Start date: 1980   Smokeless tobacco: Current   Tobacco comments:    vape  Scientific laboratory technician Use: Every day  Substance and Sexual Activity   Alcohol use: Not Currently   Drug use: Yes    Types: Marijuana    Comment: occ   Sexual activity: Not Currently    Partners: Male    Birth control/protection: Surgical    Comment: BTL, younger than 74, more than 5  Other Topics Concern   Not on file  Social History Narrative   Has 2 step children.   Lives in multi-family home with children and grandchildren.   On disability.   Social Determinants of Health   Financial Resource Strain: Not on file  Food Insecurity: Not on file  Transportation Needs: Not on file  Physical Activity: Not on file  Stress: Not on file  Social Connections: Not on file    Allergies:  Allergies  Allergen Reactions   Codeine Itching and Nausea Only   Metformin And Related Diarrhea and Other (See Comments)    Stomach pain/nausea   Wellbutrin [Bupropion Hcl] Hives   Acyclovir And Related Rash    Metabolic Disorder Labs:  Lab Results  Component Value Date   HGBA1C 7.8 (H) 06/02/2021   MPG 177.16 06/02/2021   MPG 188.64 03/05/2019   No results found for: PROLACTIN Lab Results  Component Value Date   CHOL 127 03/14/2021   TRIG 128.0 03/14/2021   HDL 38.60 (L) 03/14/2021   CHOLHDL 3 03/14/2021   VLDL 25.6 03/14/2021   LDLCALC 63 03/14/2021   LDLCALC 143 (H) 10/20/2020   Lab Results  Component Value Date   TSH 1.860 06/30/2019   TSH 2.40 11/26/2018    Therapeutic  Level Labs: No results found for: LITHIUM No results found for: VALPROATE No components found for:  CBMZ  Current Medications: Current Outpatient Medications  Medication Sig Dispense Refill   amphetamine-dextroamphetamine (ADDERALL) 30 MG tablet Take 1 tablet by mouth 2 (two) times daily. 60 tablet 0   amphetamine-dextroamphetamine (ADDERALL) 30 MG tablet Take 1 tablet by mouth 2 (two) times daily. 60 tablet 0   ACCU-CHEK GUIDE test strip USE UP TO FOUR TIMES DAILY AS DIRECTED 200 strip 11   Accu-Chek Softclix Lancets lancets USE UP TO FOUR TIMES DAILY AS DIRECTED 300 each 0   albuterol (PROVENTIL) (2.5 MG/3ML) 0.083% nebulizer solution Take 3 mLs (2.5 mg total) by nebulization every 6 (six) hours as needed for wheezing or shortness of breath. 75 mL 0   albuterol (VENTOLIN HFA) 108 (90 Base) MCG/ACT inhaler Inhale 2 puffs into the lungs every 6 (six) hours as needed for wheezing or shortness of breath.     amphetamine-dextroamphetamine (ADDERALL) 30 MG tablet Take 1 tablet by mouth 2 (two) times daily. 60 tablet 0   ARIPiprazole (ABILIFY) 5 MG tablet Take 1 tablet (5 mg total) by mouth daily. 30 tablet 2   atorvastatin (LIPITOR) 20 MG tablet Take 1 tablet (20 mg total) by mouth 2 (two) times daily. 90 tablet 3   blood glucose meter kit and supplies Dispense based on patient and insurance preference. Use up to four times daily as directed. (FOR ICD-10 E10.9, E11.9). 1 each 0   clonazePAM (KLONOPIN) 0.5 MG tablet Take 1 tablet (0.5 mg total) by mouth 2 (two) times daily as needed for anxiety. 60 tablet 2   dapagliflozin propanediol (FARXIGA) 10 MG TABS tablet Take 1 tablet (10 mg total) by mouth daily. 90 tablet 1   dextromethorphan-guaiFENesin (MUCINEX DM) 30-600 MG 12hr tablet Take 1 tablet by mouth 2 (two) times daily as needed for cough. 10 tablet 0   DULoxetine (CYMBALTA) 60 MG capsule Take 1 capsule (60 mg total) by mouth daily. 30 capsule 2   fluticasone (FLONASE) 50 MCG/ACT nasal spray  INSTILL 1 SPRAY INTO EACH NOSTRIL TWICE DAILY AFTER SINUS RINSES. PLEASE SCHEDULE APPOINTMENT FOR FURTHER REFILLS (Patient taking differently: 1 spray 2 (two) times daily as needed for allergies or rhinitis (after sinus rinse).) 48 g 2   fluticasone-salmeterol (ADVAIR HFA) 45-21 MCG/ACT inhaler Inhale 2 puffs into the lungs 2 (two) times daily. 1 each 12   furosemide (LASIX) 40 MG tablet AS NEEDED for weight increase of 3 lbs overnight or 5 lbs in 1 week (Patient taking differently: Take 40 mg by mouth daily as needed for edema. AS NEEDED for weight increase of 3 lbs overnight or 5 lbs in 1 week) 90 tablet 3   glipiZIDE (GLUCOTROL) 5 MG tablet TAKE 1 TABLET(5 MG) BY MOUTH TWICE DAILY BEFORE A MEAL 60 tablet 3   guaiFENesin (MUCINEX) 600 MG 12 hr tablet Take 2 tablets (1,200 mg total) by mouth 2 (two)  times daily. 30 tablet 0   ibuprofen (ADVIL) 200 MG tablet Take 200 mg by mouth every 6 (six) hours as needed for mild pain.     insulin glargine-yfgn (SEMGLEE) 100 UNIT/ML injection Inject 0.15 mLs (15 Units total) into the skin daily. 10 mL 11   Insulin Pen Needle (BD PEN NEEDLE NANO 2ND GEN) 32G X 4 MM MISC USE DAILY WITH VICTOZA 100 each 11   ipratropium-albuterol (DUONEB) 0.5-2.5 (3) MG/3ML SOLN Take 3 mLs by nebulization 3 (three) times daily. 360 mL 0   isosorbide mononitrate (IMDUR) 30 MG 24 hr tablet TAKE 1 TABLET(30 MG) BY MOUTH DAILY. MAKE APPOINTMENT WITH PROVIDER BEFORE FURTHER REFILLS- FIRST ATTEMPT (Patient taking differently: 30 mg daily.) 30 tablet 0   liraglutide (VICTOZA) 18 MG/3ML SOPN Inject 1.8 mg into the skin daily. 27 mL 5   losartan (COZAAR) 25 MG tablet TAKE 1 TABLET(25 MG) BY MOUTH DAILY (Patient taking differently: Take 25 mg by mouth daily.) 90 tablet 1   meloxicam (MOBIC) 15 MG tablet TAKE 1 TABLET(15 MG) BY MOUTH DAILY (Patient taking differently: Take 15 mg by mouth daily.) 90 tablet 0   metoprolol tartrate (LOPRESSOR) 25 MG tablet TAKE 1 TABLET(25 MG) BY MOUTH TWICE DAILY  180 tablet 0   nicotine (NICODERM CQ - DOSED IN MG/24 HOURS) 21 mg/24hr patch Place 1 patch (21 mg total) onto the skin daily. 28 patch 0   nystatin (MYCOSTATIN/NYSTOP) powder Apply 1 application topically 2 (two) times daily. (Patient taking differently: Apply 1 application topically 2 (two) times daily as needed (rash).) 15 g 3   OXYGEN Inhale 2 L into the lungs daily as needed (low ox levels).     pantoprazole (PROTONIX) 40 MG tablet Take 1 tablet (40 mg total) by mouth 2 (two) times daily. 60 tablet 1   predniSONE (DELTASONE) 20 MG tablet Take 2 tablets (40 mg total) by mouth daily with breakfast for 5 days. 10 tablet 0   pregabalin (LYRICA) 150 MG capsule Take 1 capsule (150 mg total) by mouth 2 (two) times daily. 180 capsule 0   No current facility-administered medications for this visit.     Musculoskeletal: Strength & Muscle Tone: na Gait & Station: na Patient leans: N/A  Psychiatric Specialty Exam: Review of Systems  Constitutional:  Positive for fatigue.  HENT:  Positive for congestion.   Respiratory:  Positive for cough and shortness of breath.   All other systems reviewed and are negative.  Last menstrual period 08/01/2014.There is no height or weight on file to calculate BMI.  General Appearance: NA  Eye Contact:  NA  Speech:  Clear and Coherent  Volume:  Normal  Mood:  Euthymic  Affect:  Appropriate and Congruent  Thought Process:  Goal Directed  Orientation:  Full (Time, Place, and Person)  Thought Content: WDL   Suicidal Thoughts:  No  Homicidal Thoughts:  No  Memory:  Immediate;   Good Recent;   Good Remote;   Good  Judgement:  Good  Insight:  Fair  Psychomotor Activity:  Decreased  Concentration:  Concentration: Good and Attention Span: Good  Recall:  Good  Fund of Knowledge: Good  Language: Good  Akathisia:  No  Handed:  Right  AIMS (if indicated): not done  Assets:  Communication Skills Desire for Improvement Resilience Social  Support Talents/Skills  ADL's:  Intact  Cognition: WNL  Sleep:  Good   Screenings: AIMS    Flowsheet Row Admission (Discharged) from 03/07/2019 in [facility]  Admission (Discharged) from 09/12/2014 in [facility] [place] [facility]  AIMS Total Score 0 0      AUDIT    Flowsheet Row Admission (Discharged) from 03/07/2019 in [place] Admission (Discharged) from 09/12/2014 in [facility]  Alcohol Use Disorder Identification Test Final Score (AUDIT) 0 0      GAD-7    Flowsheet Row Office Visit from 03/14/2021 in [facility] Visit from 10/20/2020 in [place] Visit from 11/06/2019 in [facility]  Total GAD-7 Score _0 PHQ2-9    Flowsheet Row Video Visit from 06/07/2021 in [facility] [place] Office Visit from 03/14/2021 in [facility] Video Visit from 02/24/2021 in [facility] Video Visit from 11/24/2020 in [facility] Office Visit from 10/20/2020 in [facility]  PHQ-2 Total Score 1 2 0 1 1  PHQ-9 Total Score -- 7 -- -- 10      Flowsheet Row Video Visit from 06/07/2021 in [facility] ED to Hosp-Admission (Discharged) from 06/02/2021 in [facility] ED from 05/31/2021 in [facility] No Risk No Risk No Risk        Assessment and Plan: This patient is a 56 year old female with a history of depression anxiety and ADD.  She continues to do well on her current regimen.  She will continue Cymbalta 60 mg daily for depression, Abilify 5 mg daily for augmentation, clonazepam 0.5 mg twice daily for anxiety or sleep and Adderall 30 mg twice  daily for ADD.  She will return to see me in 15-month Collaboration of Care: Collaboration of Care: Primary Care Provider AEB   records will be made available to primary care at patient's request  Patient/Guardian was advised Release of Information must be obtained prior to any record release in order to collaborate their care with an outside provider. Patient/Guardian was advised if they have not already done so to contact the registration department to sign all necessary forms in order for [facility] release information regarding their care.   Consent: Patient/Guardian gives verbal consent for treatment and assignment of benefits for services provided during this telehealth visit. Patient/Guardian expressed understanding and agreed to proceed.    DLevonne Spiller MD 06/07/2021, 2:28 PM

## 2021-06-07 NOTE — Telephone Encounter (Signed)
Per pt she wanted her Adderall to go to [facility] and the rest of her medications to [facility]   Per pt to let Dr. Harrington Challenger know to please send her Adderall to [facility]

## 2021-06-16 ENCOUNTER — Ambulatory Visit (INDEPENDENT_AMBULATORY_CARE_PROVIDER_SITE_OTHER): Payer: Medicare HMO | Admitting: Family Medicine

## 2021-06-16 ENCOUNTER — Other Ambulatory Visit: Payer: Self-pay

## 2021-06-16 VITALS — BP 116/68 | HR 88 | Temp 97.0°F | Ht 64.0 in | Wt 276.8 lb

## 2021-06-16 DIAGNOSIS — J449 Chronic obstructive pulmonary disease, unspecified: Secondary | ICD-10-CM | POA: Diagnosis not present

## 2021-06-16 DIAGNOSIS — F17200 Nicotine dependence, unspecified, uncomplicated: Secondary | ICD-10-CM

## 2021-06-16 DIAGNOSIS — Z6841 Body Mass Index (BMI) 40.0 and over, adult: Secondary | ICD-10-CM

## 2021-06-16 DIAGNOSIS — J4489 Other specified chronic obstructive pulmonary disease: Secondary | ICD-10-CM

## 2021-06-16 DIAGNOSIS — J309 Allergic rhinitis, unspecified: Secondary | ICD-10-CM

## 2021-06-16 DIAGNOSIS — E1142 Type 2 diabetes mellitus with diabetic polyneuropathy: Secondary | ICD-10-CM

## 2021-06-16 DIAGNOSIS — I10 Essential (primary) hypertension: Secondary | ICD-10-CM | POA: Diagnosis not present

## 2021-06-16 DIAGNOSIS — R69 Illness, unspecified: Secondary | ICD-10-CM | POA: Diagnosis not present

## 2021-06-16 DIAGNOSIS — E782 Mixed hyperlipidemia: Secondary | ICD-10-CM

## 2021-06-16 MED ORDER — FLUTICASONE PROPIONATE 50 MCG/ACT NA SUSP: 1.0000 | Freq: Two times a day (BID) | NASAL | 6 refills | Status: DC | PRN

## 2021-06-16 MED ORDER — LOSARTAN POTASSIUM 25 MG PO TABS: 25.0000 mg | ORAL_TABLET | Freq: Every day | ORAL | 3 refills | Status: DC

## 2021-06-16 MED ORDER — ATORVASTATIN CALCIUM 40 MG PO TABS: 20.0000 mg | ORAL_TABLET | Freq: Two times a day (BID) | ORAL | 3 refills | Status: DC

## 2021-06-16 MED ORDER — IPRATROPIUM-ALBUTEROL 0.5-2.5 (3) MG/3ML IN SOLN: 3.0000 mL | Freq: Three times a day (TID) | RESPIRATORY_TRACT | 3 refills | Status: DC

## 2021-06-16 MED ORDER — PREGABALIN 150 MG PO CAPS: 150.0000 mg | ORAL_CAPSULE | Freq: Two times a day (BID) | ORAL | 3 refills | Status: DC

## 2021-06-16 MED ORDER — ADVAIR HFA 45-21 MCG/ACT IN AERO: 2.0000 | INHALATION_SPRAY | Freq: Two times a day (BID) | RESPIRATORY_TRACT | 12 refills | Status: DC

## 2021-06-16 MED ORDER — INSULIN GLARGINE-YFGN 100 UNIT/ML ~~LOC~~ SOLN: 15.0000 [IU] | Freq: Every day | SUBCUTANEOUS | 11 refills | Status: DC

## 2021-06-16 NOTE — Progress Notes (Signed)
[facility] [address] [place] [address] Dept: [phone] Dept Fax: [phone]  Chronic Care Office Visit  Subjective:    Patient ID: [name], female    DOB: 1966-02-06, 56 y.o..   MRN: [id number]  Chief Complaint  Patient presents with   Follow-up    3 month f/u.     History of Present Illness:  Patient is in today for reassessment of chronic medical issues.  [name] was admitted at [facility] from 2/9-2/13/2023 with acute respiratory failure with hypoxia. She appears to have had an acute exacerbation of her chronic bronchitis. Her admission O2 sats were around 85%. She wa managed with IV steroids and nebulizers, eventually weaned off of oxygen and discharged. She is currently manage don Advair, DuoNeb, and PRN albuterol. [name] has decreased her smoking to about 1 ppd. She finds if she keeps busy, she can avoid smoking longer    [name] has a history of hyperlipidemia and is managed on atorvastatin.   [name] has a history of Type 2 diabetes. She is managed on dapagliflozin, liraglutide, and glipizide. During her hospitalizaiton, she was started on insulin glargine 15 units daily. Her diabetes is complicated by peripheral neuropathy. This is managed with Lyrica. She has morbid obesity, which complicates many of these issues. Although she is on a GLP-1 agonist, she has not had significant weight loss.   [name] has a history of hypertension. She is managed on metoprolol and losartan.   [name] uses occasional Lasix for swelling of the lower legs (about once a week). She is also on Imdur for angina. She states she had a prior cardiac cath which showed clean cardiac vessels.   Past Medical History: Patient Active Problem List   Diagnosis Date Noted   Acute respiratory failure with hypoxia ([place]) 06/02/2021   Marijuana use 03/14/2021   Allergic rhinitis 03/14/2021   Angina pectoris ([place])  09/08/2019   Benzodiazepine overdose 03/05/2019   Generalized muscle ache 09/24/2018   Chronic right ear pain 03/11/2018   Chronic sinusitis 03/11/2018   H/O drug abuse ([place]) 11/12/2017   Positive for macroalbuminuria 11/12/2017   Atypical chest pain 10/05/2017   COPD with chronic bronchitis ([place]) 10/01/2017   Mixed hyperlipidemia 10/01/2017   Tobacco use disorder-current smoker greater than 40-pack-year history- since age 45 1 ppd 10/01/2017   Diabetic peripheral neuropathy ([place]) 10/01/2017   Iron deficiency anemia 10/01/2017   Adult attention deficit hyperactivity disorder 01/14/2016   Major depressive disorder, recurrent severe without psychotic features ([place])    Leukocytosis 03/25/2014   OSA on CPAP 04/11/2013   Perforated ear drum 05/22/2012   Hypokalemia 05/08/2012   Bell's palsy 01/23/2012   Carpal tunnel syndrome on both sides 01/11/2012   Oxygen dependent 10/16/2011   Vitamin D deficiency 07/06/2011   Asthmatic bronchitis 06/11/2011   Back pain 06/11/2011   Morbid obesity with BMI of 45.0-49.9, adult ([place]) 05/06/2011   Gastric erosions 12/05/2010   Essential hypertension 12/02/2010   Type 2 diabetes mellitus with polyneuropathy ([place]) 12/02/2010   COPD with acute exacerbation ([place]) 11/30/2010   GERD (gastroesophageal reflux disease) 09/08/2010   Esophageal dysphagia 09/08/2010   Past Surgical History:  Procedure Laterality Date   CESAREAN SECTION  1988; [place]   COLONOSCOPY  09/16/2010   UDJ:SHFWYO colon/small internal hemorrhoids   ESOPHAGOGASTRODUODENOSCOPY  09/16/2010   SLF: normal/mild gastritis   ESOPHAGOGASTRODUODENOSCOPY N/A 10/19/2014   Procedure: ESOPHAGOGASTRODUODENOSCOPY (EGD);  Surgeon: Danie Binder, MD;  Location: [facility];  Service: Endoscopy;  Laterality: N/A;  [place]  01/17/2012   Procedure: HYSTEROSCOPY WITH THERMACHOICE;  Surgeon: Florian Buff, MD;  Location: [facility]  ORS;  Service: Gynecology;  Laterality: N/A;  total therapy time: 9:13sec  D5W  18 ml in, D5W   14m out, temperture 87degrees celcious   KIDNEY SURGERY     as child for blockages   LEFT HEART CATH AND CORONARY ANGIOGRAPHY N/A 09/08/2019   Procedure: LEFT HEART CATH AND CORONARY ANGIOGRAPHY;  Surgeon: JMartinique Peter M, MD;  [facility];  Service: Cardiovascular;  Laterality: N/A;   TONSILLECTOMY     TUBAL LIGATION  1989   TYMPANOSTOMY TUBE PLACEMENT Bilateral    "several times when I was a child"   uterine ablation     WRIST FRACTURE SURGERY Left 1995   Family History  Problem Relation Age of Onset   Heart attack Mother 584      deceased   Diabetes Mother    Breast cancer Mother 413      inflammatory breast ca   Heart failure Mother        oxygen dependence, nonsmoker   Heart disease Mother    Depression Mother    Cancer Mother        Breast   Hypertension Mother    Hyperlipidemia Mother    Sudden death Mother    Sleep apnea Mother    Obesity Mother    Heart attack Father 454      deceased, etoh use   Heart disease Father    Alcohol abuse Father    Depression Father    Heart failure Father    Sudden death Father    Ulcers Sister    Hypertension Sister    Heart failure Sister    Depression Sister    Anxiety disorder Sister    Liver disease Maternal AAunt 86      died while on liver transplant list   Liver disease Maternal Uncle    Cancer Paternal Aunt        Breast   Heart disease Maternal Grandmother    Heart attack Maternal Grandmother        premature CAD   Colon cancer Neg Hx    Outpatient Medications Prior to Visit  Medication Sig Dispense Refill   ACCU-CHEK GUIDE test strip USE UP TO FOUR TIMES DAILY AS DIRECTED 200 strip 11   Accu-Chek Softclix Lancets lancets USE UP TO FOUR TIMES DAILY AS DIRECTED 300 each 0   albuterol (PROVENTIL) (2.5 MG/3ML) 0.083% nebulizer solution Take 3 mLs (2.5 mg total) by nebulization every 6 (six) hours as  needed for wheezing or shortness of breath. 75 mL 0   albuterol (VENTOLIN HFA) 108 (90 Base) MCG/ACT inhaler Inhale 2 puffs into the lungs every 6 (six) hours as needed for wheezing or shortness of breath.     amphetamine-dextroamphetamine (ADDERALL) 30 MG tablet Take 1 tablet by mouth 2 (two) times daily. 60 tablet 0   ARIPiprazole (ABILIFY) 5 MG tablet Take 1 tablet (5 mg total) by mouth daily. 30 tablet 2   blood glucose meter kit and supplies Dispense based on patient and insurance preference. Use up to four times daily as directed. (FOR ICD-10 E10.9, E11.9). 1 each 0   clonazePAM (KLONOPIN) 0.5 MG tablet Take 1 tablet (0.5 mg total) by  mouth 2 (two) times daily as needed for anxiety. 60 tablet 2   dapagliflozin propanediol (FARXIGA) 10 MG TABS tablet Take 1 tablet (10 mg total) by mouth daily. 90 tablet 1   dextromethorphan-guaiFENesin (MUCINEX DM) 30-600 MG 12hr tablet Take 1 tablet by mouth 2 (two) times daily as needed for cough. 10 tablet 0   DULoxetine (CYMBALTA) 60 MG capsule Take 1 capsule (60 mg total) by mouth daily. 30 capsule 2   furosemide (LASIX) 40 MG tablet AS NEEDED for weight increase of 3 lbs overnight or 5 lbs in 1 week (Patient taking differently: Take 40 mg by mouth daily as needed for edema. AS NEEDED for weight increase of 3 lbs overnight or 5 lbs in 1 week) 90 tablet 3   glipiZIDE (GLUCOTROL) 5 MG tablet TAKE 1 TABLET(5 MG) BY MOUTH TWICE DAILY BEFORE A MEAL 60 tablet 3   guaiFENesin (MUCINEX) 600 MG 12 hr tablet Take 2 tablets (1,200 mg total) by mouth 2 (two) times daily. 30 tablet 0   ibuprofen (ADVIL) 200 MG tablet Take 200 mg by mouth every 6 (six) hours as needed for mild pain.     Insulin Pen Needle (BD PEN NEEDLE NANO 2ND GEN) 32G X 4 MM MISC USE DAILY WITH VICTOZA 100 each 11   isosorbide mononitrate (IMDUR) 30 MG 24 hr tablet TAKE 1 TABLET(30 MG) BY MOUTH DAILY. MAKE APPOINTMENT WITH PROVIDER BEFORE FURTHER REFILLS- FIRST ATTEMPT (Patient taking differently: 30 mg  daily.) 30 tablet 0   liraglutide (VICTOZA) 18 MG/3ML SOPN Inject 1.8 mg into the skin daily. 27 mL 5   meloxicam (MOBIC) 15 MG tablet TAKE 1 TABLET(15 MG) BY MOUTH DAILY (Patient taking differently: Take 15 mg by mouth daily.) 90 tablet 0   metoprolol tartrate (LOPRESSOR) 25 MG tablet TAKE 1 TABLET(25 MG) BY MOUTH TWICE DAILY 180 tablet 0   nystatin (MYCOSTATIN/NYSTOP) powder Apply 1 application topically 2 (two) times daily. (Patient taking differently: Apply 1 application topically 2 (two) times daily as needed (rash).) 15 g 3   OXYGEN Inhale 2 L into the lungs daily as needed (low ox levels).     pantoprazole (PROTONIX) 40 MG tablet Take 1 tablet (40 mg total) by mouth 2 (two) times daily. 60 tablet 1   atorvastatin (LIPITOR) 20 MG tablet Take 1 tablet (20 mg total) by mouth 2 (two) times daily. 90 tablet 3   fluticasone (FLONASE) 50 MCG/ACT nasal spray INSTILL 1 SPRAY INTO EACH NOSTRIL TWICE DAILY AFTER SINUS RINSES. PLEASE SCHEDULE APPOINTMENT FOR FURTHER REFILLS (Patient taking differently: 1 spray 2 (two) times daily as needed for allergies or rhinitis (after sinus rinse).) 48 g 2   insulin glargine-yfgn (SEMGLEE) 100 UNIT/ML injection Inject 0.15 mLs (15 Units total) into the skin daily. 10 mL 11   ipratropium-albuterol (DUONEB) 0.5-2.5 (3) MG/3ML SOLN Take 3 mLs by nebulization 3 (three) times daily. 360 mL 0   losartan (COZAAR) 25 MG tablet TAKE 1 TABLET(25 MG) BY MOUTH DAILY (Patient taking differently: Take 25 mg by mouth daily.) 90 tablet 1   pregabalin (LYRICA) 150 MG capsule Take 1 capsule (150 mg total) by mouth 2 (two) times daily. 180 capsule 0   nicotine (NICODERM CQ - DOSED IN MG/24 HOURS) 21 mg/24hr patch Place 1 patch (21 mg total) onto the skin daily. (Patient not taking: Reported on 06/16/2021) 28 patch 0   amphetamine-dextroamphetamine (ADDERALL) 30 MG tablet Take 1 tablet by mouth 2 (two) times daily. 60 tablet 0   amphetamine-dextroamphetamine (ADDERALL)  30 MG tablet Take 1  tablet by mouth 2 (two) times daily. 60 tablet 0   fluticasone-salmeterol (ADVAIR HFA) 45-21 MCG/ACT inhaler Inhale 2 puffs into the lungs 2 (two) times daily. (Patient not taking: Reported on 06/16/2021) 1 each 12   No facility-administered medications prior to visit.   Allergies  Allergen Reactions   Codeine Itching and Nausea Only   Metformin And Related Diarrhea and Other (See Comments)    Stomach pain/nausea   Wellbutrin [Bupropion Hcl] Hives   Acyclovir And Related Rash    Objective:   Today's Vitals   06/16/21 1046  BP: 116/68  Pulse: 88  Temp: (!) 97 F (36.1 C)  TempSrc: Temporal  SpO2: 96%  Weight: 276 lb 12.8 oz (125.6 kg)  Height: _0  (1.626 m)   Body mass index is 47.51 kg/m.   General: Well developed, well nourished. No acute distress. Lungs- Bibasilar coarse rhonchi with possible wheeze. Psych: Alert and oriented. Normal mood and affect.  Health Maintenance Due  Topic Date Due   Hepatitis C Screening  Never done   Zoster Vaccines- Shingrix (1 of 2) Never done   OPHTHALMOLOGY EXAM  05/25/2021   Lab Results Last CBC Lab Results  Component Value Date   WBC 14.6 (H) 06/03/2021   HGB 13.7 06/03/2021   HCT 45.5 06/03/2021   MCV 83.2 06/03/2021   MCH 25.0 (L) 06/03/2021   RDW 17.9 (H) 06/03/2021   PLT 260 37/48/2707   Last metabolic panel Lab Results  Component Value Date   GLUCOSE 292 (H) 06/05/2021   NA 137 06/05/2021   K 4.1 06/05/2021   CL 99 06/05/2021   CO2 29 06/05/2021   BUN 23 (H) 06/05/2021   CREATININE 0.82 06/05/2021   GFRNONAA >60 06/05/2021   CALCIUM 8.9 06/05/2021   PHOS 4.3 06/03/2021   PROT 7.2 06/03/2021   ALBUMIN 3.4 (L) 06/03/2021   LABGLOB 2.6 06/30/2019   AGRATIO 1.7 06/30/2019   BILITOT 0.2 (L) 06/03/2021   ALKPHOS 56 06/03/2021   AST 17 06/03/2021   ALT 20 06/03/2021   ANIONGAP 9 06/05/2021   Last lipids Lab Results  Component Value Date   CHOL 127 03/14/2021   HDL 38.60 (L) 03/14/2021   LDLCALC 63  03/14/2021   LDLDIRECT 121 (H) 10/03/2013   TRIG 128.0 03/14/2021   CHOLHDL 3 03/14/2021   Last hemoglobin A1c Lab Results  Component Value Date   HGBA1C 7.8 (H) 06/02/2021   Assessment & Plan:   1. COPD with chronic bronchitis ([place]) Improved form when she was admitted. Her lung exam remains abnormal. She should continue her LABA/ICS and SABA/SAMA.  - fluticasone-salmeterol (ADVAIR HFA) 45-21 MCG/ACT inhaler; Inhale 2 puffs into the lungs 2 (two) times daily.  Dispense: 1 each; Refill: 12 - ipratropium-albuterol (DUONEB) 0.5-2.5 (3) MG/3ML SOLN; Take 3 mLs by nebulization 3 (three) times daily.  Dispense: 360 mL; Refill: 3  2. Tobacco use disorder-current smoker greater than 40-pack-year history- since age 37 1 ppd I continue to advise Ms. Richer of the need to stop use of tobacco. She has tapered down by 1/3rd from her previosul level of smoking. She either had adverse reactions or found medications ineffective in helping her stop. We discussed strategies for gradually tapering use. I spent 4 minutes counseling her on tobacco cessation.  3. Type 2 diabetes mellitus with polyneuropathy ([place]) Ms. Gulick A1c is down from 9/7% in June. I agree with the addition of a basal insulin. We will reassess her A1c  at her next visit.  - insulin glargine-yfgn (SEMGLEE) 100 UNIT/ML injection; Inject 0.15 mLs (15 Units total) into the skin daily.  Dispense: 10 mL; Refill: 11  4. Diabetic peripheral neuropathy ([facility]) Stable on Lyrica. Improved blood sugar control should help.  - pregabalin (LYRICA) 150 MG capsule; Take 1 capsule (150 mg total) by mouth 2 (two) times daily.  Dispense: 180 capsule; Refill: 3  5. Essential hypertension Blood pressure in good control. Continue losartan.  - losartan (COZAAR) 25 MG tablet; Take 1 tablet (25 mg total) by mouth daily. TAKE 1 TABLET(25 MG) BY MOUTH DAILY Strength: 25 mg  Dispense: 90 tablet; Refill: 3  6. Mixed hyperlipidemia Lipids at goal. Continue  atorvastatin.  - atorvastatin (LIPITOR) 40 MG tablet; Take 0.5 tablets (20 mg total) by mouth 2 (two) times daily.  Dispense: 90 tablet; Refill: 3  7. Morbid obesity with BMI of 45.0-49.9, adult ([place]) Weight is currently stable. Continue to recommend a low calorie diet and increased physical activity as tolerated.  8. Allergic rhinitis, unspecified seasonality, unspecified trigger I will renew Flonase, as allergy season has commenced.  - fluticasone (FLONASE) 50 MCG/ACT nasal spray; Place 1 spray into both nostrils 2 (two) times daily as needed for allergies or rhinitis (after sinus rinse).  Dispense: 11.1 mL; Refill: 6   Return in about 3 months (around 09/13/2021) for Reassessment.   Haydee Salter, MD

## 2021-06-22 ENCOUNTER — Other Ambulatory Visit: Payer: Self-pay | Admitting: Interventional Cardiology

## 2021-06-27 ENCOUNTER — Telehealth: Payer: Self-pay | Admitting: Family Medicine

## 2021-06-27 ENCOUNTER — Other Ambulatory Visit: Payer: Self-pay | Admitting: Family Medicine

## 2021-06-27 ENCOUNTER — Other Ambulatory Visit (HOSPITAL_COMMUNITY): Payer: Self-pay | Admitting: Psychiatry

## 2021-06-27 DIAGNOSIS — G8929 Other chronic pain: Secondary | ICD-10-CM

## 2021-06-27 DIAGNOSIS — M25511 Pain in right shoulder: Secondary | ICD-10-CM

## 2021-06-27 DIAGNOSIS — M5442 Lumbago with sciatica, left side: Secondary | ICD-10-CM

## 2021-06-27 DIAGNOSIS — E1169 Type 2 diabetes mellitus with other specified complication: Secondary | ICD-10-CM

## 2021-06-27 NOTE — Telephone Encounter (Signed)
Left message for patient to call back and schedule Medicare Annual Wellness Visit (AWV) in office.  ? ?If not able to come in office, please offer to do virtually or by telephone.  Left office number and my jabber [phone]. ? ?Last AWV:10/30/2019 ? ?Please schedule at anytime with Nurse Health Advisor. ?  ?

## 2021-07-06 ENCOUNTER — Telehealth: Payer: Self-pay | Admitting: Family Medicine

## 2021-07-06 NOTE — Chronic Care Management (AMB) (Signed)
?  Chronic Care Management  ? ?Outreach Note ? ?07/06/2021 ?Name: [name] MRN: [id number] DOB: [birth year] ? ?Referred by: Haydee Salter, MD ?Reason for referral : No chief complaint on file. ? ? ?An unsuccessful telephone outreach was attempted today. The patient was referred to the pharmacist for assistance with care management and care coordination.  ? ?Follow Up Plan:  ? ?Tatjana Dellinger ?Upstream Scheduler  ?

## 2021-07-13 ENCOUNTER — Telehealth: Payer: Self-pay | Admitting: Family Medicine

## 2021-07-13 NOTE — Chronic Care Management (AMB) (Signed)
?  Chronic Care Management  ? ?Outreach Note ? ?07/13/2021 ?Name: Krystyne Tewksbury MRN: [id number] DOB: 1965-09-08 ? ?Referred by: Haydee Salter, MD ?Reason for referral : No chief complaint on file. ? ? ?A second unsuccessful telephone outreach was attempted today. The patient was referred to pharmacist for assistance with care management and care coordination. ? ?Follow Up Plan:  ? ?Tatjana Dellinger ?Upstream Scheduler  ?

## 2021-07-20 ENCOUNTER — Telehealth: Payer: Self-pay | Admitting: Family Medicine

## 2021-07-20 NOTE — Progress Notes (Signed)
?  Chronic Care Management  ? ?Outreach Note ? ?07/20/2021 ?Name: Classie Weng MRN: [id number] DOB: Sep 14, 1965 ? ?Referred by: Haydee Salter, MD ?Reason for referral : No chief complaint on file. ? ? ?Third unsuccessful telephone outreach was attempted today. The patient was referred to the pharmacist for assistance with care management and care coordination.  ? ?Follow Up Plan:  ? ?Tatjana Dellinger ?Upstream Scheduler  ?

## 2021-07-25 ENCOUNTER — Telehealth: Payer: Self-pay | Admitting: Family Medicine

## 2021-07-25 NOTE — Telephone Encounter (Signed)
Left message for patient to call back and schedule Medicare Annual Wellness Visit (AWV) in office.  ? ?If not able to come in office, please offer to do virtually or by telephone.  Left office number and my jabber [phone]. ? ?Last AWV:10/30/2019 ? ?Please schedule at anytime with Nurse Health Advisor. ?  ?

## 2021-07-27 ENCOUNTER — Other Ambulatory Visit (HOSPITAL_COMMUNITY): Payer: Self-pay | Admitting: Psychiatry

## 2021-08-16 ENCOUNTER — Telehealth: Payer: Self-pay | Admitting: Family Medicine

## 2021-08-16 NOTE — Telephone Encounter (Signed)
Left message for patient to call back and schedule Medicare Annual Wellness Visit (AWV).   Please offer to do virtually or by telephone.  Left office number and my jabber #[phone].  Last AWV:10/30/2019  Please schedule at anytime with Nurse Health Advisor.   

## 2021-08-26 ENCOUNTER — Other Ambulatory Visit: Payer: Self-pay | Admitting: Family Medicine

## 2021-08-26 ENCOUNTER — Other Ambulatory Visit: Payer: Self-pay | Admitting: Cardiology

## 2021-08-26 DIAGNOSIS — E1165 Type 2 diabetes mellitus with hyperglycemia: Secondary | ICD-10-CM

## 2021-09-01 ENCOUNTER — Telehealth (INDEPENDENT_AMBULATORY_CARE_PROVIDER_SITE_OTHER): Payer: Self-pay | Admitting: Psychiatry

## 2021-09-01 DIAGNOSIS — Z91199 Patient's noncompliance with other medical treatment and regimen due to unspecified reason: Secondary | ICD-10-CM

## 2021-09-02 ENCOUNTER — Telehealth (INDEPENDENT_AMBULATORY_CARE_PROVIDER_SITE_OTHER): Payer: Medicare HMO | Admitting: Psychiatry

## 2021-09-02 ENCOUNTER — Encounter (HOSPITAL_COMMUNITY): Payer: Self-pay | Admitting: Psychiatry

## 2021-09-02 DIAGNOSIS — R69 Illness, unspecified: Secondary | ICD-10-CM | POA: Diagnosis not present

## 2021-09-02 DIAGNOSIS — F909 Attention-deficit hyperactivity disorder, unspecified type: Secondary | ICD-10-CM

## 2021-09-02 DIAGNOSIS — F331 Major depressive disorder, recurrent, moderate: Secondary | ICD-10-CM | POA: Diagnosis not present

## 2021-09-02 MED ORDER — DULOXETINE HCL 60 MG PO CPEP: ORAL_CAPSULE | ORAL | 2 refills | Status: DC

## 2021-09-02 MED ORDER — ARIPIPRAZOLE 5 MG PO TABS: 5.0000 mg | ORAL_TABLET | Freq: Every day | ORAL | 1 refills | Status: DC

## 2021-09-02 MED ORDER — AMPHETAMINE-DEXTROAMPHETAMINE 30 MG PO TABS: 30.0000 mg | ORAL_TABLET | Freq: Two times a day (BID) | ORAL | 0 refills | Status: DC

## 2021-09-02 MED ORDER — CLONAZEPAM 0.5 MG PO TABS: 0.5000 mg | ORAL_TABLET | Freq: Two times a day (BID) | ORAL | 2 refills | Status: DC | PRN

## 2021-09-02 NOTE — Progress Notes (Signed)
Virtual Visit via Telephone Note ? ?I connected with [name] on 09/02/21 at 11:20 AM EDT by telephone and verified that I am speaking with the correct person using two identifiers. ? ?Location: ?Patient: home ?Provider: office ?  ?I discussed the limitations, risks, security and privacy concerns of performing an evaluation and management service by telephone and the availability of in person appointments. I also discussed with the patient that there may be a patient responsible charge related to this service. The patient expressed understanding and agreed to proceed. ? ? ?  ?I discussed the assessment and treatment plan with the patient. The patient was provided an opportunity to ask questions and all were answered. The patient agreed with the plan and demonstrated an understanding of the instructions. ?  ?The patient was advised to call back or seek an in-person evaluation if the symptoms worsen or if the condition fails to improve as anticipated. ? ?I provided 12 minutes of non-face-to-face time during this encounter. ? ? ?[name], MD ? ?BH MD/PA/NP OP Progress Note ? ?09/02/2021 11:37 AM ?[name]  ?MRN:  [id number] ? ?Chief Complaint:  ?Chief Complaint  ?Patient presents with  ? Anxiety  ? Depression  ? Follow-up  ? ?HPI:  This patient is a 56 year old married white female who lives with her husband in [place].  She is on disability. ? ?The patient returns for follow-up after 3 months regarding depression anxiety and attention deficit disorder.  She states overall she has been doing well.  Her daughter's boyfriend had an accident last week in which a pallet fell on his head and he is now not able to walk and is at [facility].  This has been disruptive for the whole family but nevertheless her mood has been stable. ? ?The patient denies significant depression thoughts of self-harm or suicide.  She is sleeping and eating well.  Her energy is fairly good.  She is focusing  well with the Adderall. ?Visit Diagnosis:  ?  ICD-10-CM   ?1. Adult attention deficit hyperactivity disorder  F90.9   ?  ?2. Moderate episode of recurrent major depressive disorder (HCC)  F33.1   ?  ? ? ?Past Psychiatric History: The patient was hospitalized twice in her 48s and again in 2006 and 2020 for drug overdose with suicidal intent ? ?Past Medical History:  ?Past Medical History:  ?Diagnosis Date  ? ADHD   ? Alcohol abuse   ? Anemia   ? Anxiety   ? Asthmatic bronchitis   ? normal PFT/ seen by pulmonary no evidence of COPD  ? Atypical chest pain 10/05/2017  ? Back pain   ? Bilateral swelling of feet   ? Chest pain   ? Chest pain on respiration 03/25/2014  ? Chronic bronchitis ([place])   ? Chronic lower back pain   ? Chronic respiratory failure ([place]) 10/16/2011  ? Newly 02 dep 24/7 p discharge from [facility] 01/2013  - 03/13/2014  Walked RA  2 laps @ 185 ft each stopped due to  Sob/ aching in legs, thirsty/ no desat @ slow pace   ? Chronic respiratory failure with hypoxia (HCC)   ? On 2-3 L of oxygen at home  ? Cigarette smoker 12/02/2010  ? Followed in Pulmonary clinic/ [facility]/ [place]   - Limits of effective care reviewed 12/22/2011    ? Complication of anesthesia   ? Constipation   ? COPD (chronic obstructive pulmonary disease) ([place])   ? COPD with chronic bronchitis (  [place]) 10/01/2017  ? Daily headache   ? Depression   ? Diabetic peripheral neuropathy ([place])   ? DM (diabetes mellitus) type II controlled, neurological manifestation ([place]) 12/02/2010  ? Drug use   ? Gallbladder problem   ? Gastric erosions   ? EGD 08/2010.  ? GERD (gastroesophageal reflux disease)   ? Glaucoma   ? H/O drug abuse ([place]) 11/12/2017  ? -- scanned document from outside source: [facility] in [place] which showed UDS positive for Adderall /amphetamine usage as well as positive urine for THC.  This test result was collected 08/11/2016 and reported 10/07/2016. - lso review of the chart shows another positive  amphetamine, THC and METH in the urine back on 10/18/2015 under "care everywhere". ---So   ? Heavy menses   ? High cholesterol   ? History of blood transfusion   ? "related to low HgB" (10/05/2017)  ? History of hiatal hernia   ? HTN (hypertension)   ? Hyperlipidemia associated with type 2 diabetes mellitus ([place]) 10/18/2015  ? Hypertension associated with diabetes ([place]) 12/02/2010  ? D/c acei 12/22/2011 due to psuedowheeze and narcotic dependent cough> ? Improved - 16/01/9603 started bystolic in place of cozar due to cough    ? IBS (irritable bowel syndrome)   ? Increased urinary protein excretion   ? Internal hemorrhoids   ? Colonoscopy 5/12.  ? Iron deficiency anemia 10/01/2017  ? Joint pain   ? Mixed diabetic hyperlipidemia associated with type 2 diabetes mellitus ([place]) 10/01/2017  ? On home oxygen therapy   ? "5L at night" (10/05/2017)  ? OSA on CPAP   ? Osteoarthritis   ? "back" (10/05/2017)  ? Oxygen dependent 10/16/2011  ? Pneumonia   ? "lots of times" (10/05/2017)  ? PONV (postoperative nausea and vomiting)   ? Poorly controlled diabetes mellitus ([place]) 11/12/2017  ? Pulmonary infiltrates 12/22/2011  ? Followed in Pulmonary clinic/ [facility]/ [place]    - See CT Chest 05/05/11   ? Shortness of breath   ? Tachycardia   ? never had test done since no insurance  ? Tobacco use disorder-current smoker greater than 40-pack-year history- since age 89 1 ppd 10/01/2017  ? Type II diabetes mellitus ([place])   ? Vitamin D deficiency   ?  ?Past Surgical History:  ?Procedure Laterality Date  ? [place]; 1989  ? CHOLECYSTECTOMY OPEN  1990  ? COLONOSCOPY  09/16/2010  ? [id number] colon/small internal hemorrhoids  ? ESOPHAGOGASTRODUODENOSCOPY  09/16/2010  ? SLF: normal/mild gastritis  ? ESOPHAGOGASTRODUODENOSCOPY N/A 10/19/2014  ? Procedure: ESOPHAGOGASTRODUODENOSCOPY (EGD);  Surgeon: Danie Binder, MD;  Location: [facility];  Service: Endoscopy;  Laterality: N/A;  830  ? FRACTURE SURGERY    ? HYSTEROSCOPY WITH  THERMACHOICE  01/17/2012  ? Procedure: HYSTEROSCOPY WITH THERMACHOICE;  Surgeon: Florian Buff, MD;  Location: [facility] ORS;  Service: Gynecology;  Laterality: N/A;  total therapy time: 9:13sec  D5W  18 ml in, D5W   22m out, temperture 87degrees celcious  ? KIDNEY SURGERY    ? as child for blockages  ? LEFT HEART CATH AND CORONARY ANGIOGRAPHY N/A 09/08/2019  ? Procedure: LEFT HEART CATH AND CORONARY ANGIOGRAPHY;  Surgeon: JMartinique Peter M, MD;  [facility];  Service: Cardiovascular;  Laterality: N/A;  ? TONSILLECTOMY    ? TUBAL LIGATION  1989  ? TYMPANOSTOMY TUBE PLACEMENT Bilateral   ? "several times when I was a child"  ?  uterine ablation    ? WRIST FRACTURE SURGERY Left 1995  ? ? ?Family Psychiatric History: see below ? ?Family History:  ?Family History  ?Problem Relation Age of Onset  ? Heart attack Mother 40  ?     deceased  ? Diabetes Mother   ? Breast cancer Mother 49  ?     inflammatory breast ca  ? Heart failure Mother   ?     oxygen dependence, nonsmoker  ? Heart disease Mother   ? Depression Mother   ? Cancer Mother   ?     Breast  ? Hypertension Mother   ? Hyperlipidemia Mother   ? Sudden death Mother   ? Sleep apnea Mother   ? Obesity Mother   ? Heart attack Father 29  ?     deceased, etoh use  ? Heart disease Father   ? Alcohol abuse Father   ? Depression Father   ? Heart failure Father   ? Sudden death Father   ? Ulcers Sister   ? Hypertension Sister   ? Heart failure Sister   ? Depression Sister   ? Anxiety disorder Sister   ? Liver disease Maternal Aunt 48  ?     died while on liver transplant list  ? Liver disease Maternal Uncle   ? Cancer Paternal Aunt   ?     Breast  ? Heart disease Maternal Grandmother   ? Heart attack Maternal Grandmother   ?     premature CAD  ? Colon cancer Neg Hx   ? ? ?Social History:  ?Social History  ? ?Socioeconomic History  ? Marital status: Married  ?  Spouse name: Not on file  ? Number of children: 2  ? Years of education: Not on file  ? Highest education  level: Not on file  ?Occupational History  ? Occupation: unemployed  ?Tobacco Use  ? Smoking status: Every Day  ?  Packs/day: 1.50  ?  Years: 40.00  ?  Pack years: 60.00  ?  Types: Cigarettes, E-cigarettes  ?  Start

## 2021-09-12 ENCOUNTER — Telehealth: Payer: Self-pay | Admitting: Family Medicine

## 2021-09-12 NOTE — Telephone Encounter (Signed)
Left message for patient to call back and schedule Medicare Annual Wellness Visit (AWV).   Please offer to do virtually or by telephone.  Left office number and my jabber #[phone].  Last AWV:10/30/2019  Please schedule at anytime with Nurse Health Advisor.   

## 2021-09-13 ENCOUNTER — Encounter: Payer: Self-pay | Admitting: Family Medicine

## 2021-09-13 ENCOUNTER — Other Ambulatory Visit: Payer: Self-pay | Admitting: Family Medicine

## 2021-09-13 ENCOUNTER — Ambulatory Visit (INDEPENDENT_AMBULATORY_CARE_PROVIDER_SITE_OTHER): Payer: Medicare HMO | Admitting: Family Medicine

## 2021-09-13 VITALS — BP 124/76 | HR 83 | Temp 97.5°F | Ht 64.0 in | Wt 288.0 lb

## 2021-09-13 DIAGNOSIS — E1142 Type 2 diabetes mellitus with diabetic polyneuropathy: Secondary | ICD-10-CM | POA: Diagnosis not present

## 2021-09-13 DIAGNOSIS — R69 Illness, unspecified: Secondary | ICD-10-CM | POA: Diagnosis not present

## 2021-09-13 DIAGNOSIS — Z6841 Body Mass Index (BMI) 40.0 and over, adult: Secondary | ICD-10-CM

## 2021-09-13 DIAGNOSIS — J4489 Other specified chronic obstructive pulmonary disease: Secondary | ICD-10-CM

## 2021-09-13 DIAGNOSIS — J449 Chronic obstructive pulmonary disease, unspecified: Secondary | ICD-10-CM | POA: Diagnosis not present

## 2021-09-13 DIAGNOSIS — I10 Essential (primary) hypertension: Secondary | ICD-10-CM

## 2021-09-13 DIAGNOSIS — F17200 Nicotine dependence, unspecified, uncomplicated: Secondary | ICD-10-CM

## 2021-09-13 LAB — HEMOGLOBIN A1C: Hgb A1c MFr Bld: 8.9 % — ABNORMAL HIGH (ref 4.6–6.5)

## 2021-09-13 LAB — GLUCOSE, RANDOM: Glucose, Bld: 195 mg/dL — ABNORMAL HIGH (ref 70–99)

## 2021-09-13 MED ORDER — SEMAGLUTIDE (1 MG/DOSE) 4 MG/3ML ~~LOC~~ SOPN: 1.0000 mg | PEN_INJECTOR | SUBCUTANEOUS | 3 refills | Status: DC

## 2021-09-13 MED ORDER — OZEMPIC (0.25 OR 0.5 MG/DOSE) 2 MG/3ML ~~LOC~~ SOPN: PEN_INJECTOR | SUBCUTANEOUS | 0 refills | Status: AC

## 2021-09-13 MED ORDER — INSULIN GLARGINE-YFGN 100 UNIT/ML ~~LOC~~ SOLN: 15.0000 [IU] | Freq: Every day | SUBCUTANEOUS | 11 refills | Status: DC

## 2021-09-13 NOTE — Progress Notes (Signed)
[facility] [address] [place] [address] Dept: [phone] Dept Fax: [phone]  Chronic Care Office Visit  Subjective:    Patient ID: [name], female    DOB: 1965-10-19, 56 y.o..   MRN: [id number]  Chief Complaint  Patient presents with   Follow-up    3 month f/u. No concerns.      History of Present Illness:  Patient is in today for reassessment of chronic medical issues.  [name] has a history of COPD with chronic bronchitis. She is currently managed PRN albuterol. [name] previously decreased her smoking from 1 1/2 ppd to 1 ppd. She had previously tried Chantix without success. She had been on bupropion and did find it cut down on her urge to smoke. However, she developed hives, so had to stop this medication. She has tried using nicotine patches. She notes the expense of these is an issue. She notes she smokes int he middle of the night when she wakes up.    [name] has a history of hyperlipidemia and is managed on atorvastatin.   [name] has a history of Type 2 diabetes. She is managed on dapagliflozin, liraglutide, and glipizide. She was started on insulin glargine 15 units daily after a hospitalization this winter, but noted she was unable to pick this up from the pharmacy. Her diabetes is complicated by peripheral neuropathy. This is managed with Lyrica.   [name] has morbid obesity, which complicates many of these issues. Although she is on a liraglutide, she has not had weight loss. In fact she has gained 12 lbs. since her last visit 3 months ago. She notes that she finds herself overeating regularly and never feels full. Her weight and respiratory issues have made it very difficult to get exercise.   [name] has a history of hypertension. She is managed on metoprolol and losartan.   [name] is being treated for ADHD by Dr. [name] (psychiatry). She is managed on  Adderall for this.  Past Medical History: Patient Active Problem List   Diagnosis Date Noted   Acute respiratory failure with hypoxia ([place]) 06/02/2021   Marijuana use 03/14/2021   Allergic rhinitis 03/14/2021   Angina pectoris ([place]) 09/08/2019   Benzodiazepine overdose 03/05/2019   Generalized muscle ache 09/24/2018   Chronic right ear pain 03/11/2018   Chronic sinusitis 03/11/2018   H/O drug abuse ([place]) 11/12/2017   Positive for macroalbuminuria 11/12/2017   Atypical chest pain 10/05/2017   COPD with chronic bronchitis ([place]) 10/01/2017   Mixed hyperlipidemia 10/01/2017   Tobacco use disorder 10/01/2017   Diabetic peripheral neuropathy ([place]) 10/01/2017   Iron deficiency anemia 10/01/2017   Adult attention deficit hyperactivity disorder 01/14/2016   Major depressive disorder, recurrent severe without psychotic features ([place])    Leukocytosis 03/25/2014   OSA on CPAP 04/11/2013   Perforated ear drum 05/22/2012   Hypokalemia 05/08/2012   Bell's palsy 01/23/2012   Carpal tunnel syndrome on both sides 01/11/2012   Oxygen dependent 10/16/2011   Vitamin D deficiency 07/06/2011   Asthmatic bronchitis 06/11/2011   Back pain 06/11/2011   Morbid obesity with BMI of 45.0-49.9, adult ([place]) 05/06/2011   Gastric erosions 12/05/2010   Essential hypertension 12/02/2010   Type 2 diabetes mellitus with polyneuropathy ([place]) 12/02/2010   COPD with acute exacerbation ([place]) 11/30/2010   GERD (gastroesophageal reflux disease) 09/08/2010   Esophageal dysphagia 09/08/2010   Past Surgical History:  Procedure Laterality Date   CESAREAN  SECTION  1988; [place]   COLONOSCOPY  09/16/2010   MVE:[id number] colon/small internal hemorrhoids   ESOPHAGOGASTRODUODENOSCOPY  09/16/2010   SLF: normal/mild gastritis   ESOPHAGOGASTRODUODENOSCOPY N/A 10/19/2014   Procedure: ESOPHAGOGASTRODUODENOSCOPY (EGD);  Surgeon: Danie Binder, MD;  Location[facility];  Service: Endoscopy;   Laterality: N/A;  [place]  01/17/2012   Procedure: HYSTEROSCOPY WITH THERMACHOICE;  Surgeon: Florian Buff, MD;  Location: [facility];  Service: Gynecology;  Laterality: N/A;  total therapy time: 9:13sec  D5W  18 ml in, D5W   23m out, temperture 87degrees celcious   KIDNEY SURGERY     as child for blockages   LEFT HEART CATH AND CORONARY ANGIOGRAPHY N/A 09/08/2019   Procedure: LEFT HEART CATH AND CORONARY ANGIOGRAPHY;  Surgeon: JMartinique Peter M, MD;  [facility];  Service: Cardiovascular;  Laterality: N/A;   TONSILLECTOMY     TUBAL LIGATION  1989   TYMPANOSTOMY TUBE PLACEMENT Bilateral    "several times when I was a child"   uterine ablation     WRIST FRACTURE SURGERY Left 1995   Family History  Problem Relation Age of Onset   Heart attack Mother 570      deceased   Diabetes Mother    Breast cancer Mother 427      inflammatory breast ca   Heart failure Mother        oxygen dependence, nonsmoker   Heart disease Mother    Depression Mother    Cancer Mother        Breast   Hypertension Mother    Hyperlipidemia Mother    Sudden death Mother    Sleep apnea Mother    Obesity Mother    Heart attack Father 43      deceased, etoh use   Heart disease Father    Alcohol abuse Father    Depression Father    Heart failure Father    Sudden death Father    Ulcers Sister    Hypertension Sister    Heart failure Sister    Depression Sister    Anxiety disorder Sister    Liver disease Maternal AAunt 71      died while on liver transplant list   Liver disease Maternal Uncle    Cancer Paternal Aunt        Breast   Heart disease Maternal Grandmother    Heart attack Maternal Grandmother        premature CAD   Colon cancer Neg Hx    Outpatient Medications Prior to Visit  Medication Sig Dispense Refill   ACCU-CHEK GUIDE test strip USE UP TO FOUR TIMES DAILY AS DIRECTED 200 strip 11   Accu-Chek Softclix Lancets lancets USE UP  TO FOUR TIMES DAILY AS DIRECTED 300 each 0   albuterol (PROVENTIL) (2.5 MG/3ML) 0.083% nebulizer solution Take 3 mLs (2.5 mg total) by nebulization every 6 (six) hours as needed for wheezing or shortness of breath. 75 mL 0   albuterol (VENTOLIN HFA) 108 (90 Base) MCG/ACT inhaler Inhale 2 puffs into the lungs every 6 (six) hours as needed for wheezing or shortness of breath.     amphetamine-dextroamphetamine (ADDERALL) 30 MG tablet Take 1 tablet by mouth 2 (two) times daily. 60 tablet 0   amphetamine-dextroamphetamine (ADDERALL) 30 MG tablet Take 1 tablet by mouth 2 (two) times daily. 60 tablet 0  amphetamine-dextroamphetamine (ADDERALL) 30 MG tablet Take 1 tablet by mouth 2 (two) times daily. 60 tablet 0   ARIPiprazole (ABILIFY) 5 MG tablet Take 1 tablet (5 mg total) by mouth daily. 90 tablet 1   atorvastatin (LIPITOR) 40 MG tablet Take 0.5 tablets (20 mg total) by mouth 2 (two) times daily. 90 tablet 3   blood glucose meter kit and supplies Dispense based on patient and insurance preference. Use up to four times daily as directed. (FOR ICD-10 E10.9, E11.9). 1 each 0   clonazePAM (KLONOPIN) 0.5 MG tablet Take 1 tablet (0.5 mg total) by mouth 2 (two) times daily as needed for anxiety. 60 tablet 2   dextromethorphan-guaiFENesin (MUCINEX DM) 30-600 MG 12hr tablet Take 1 tablet by mouth 2 (two) times daily as needed for cough. 10 tablet 0   DULoxetine (CYMBALTA) 60 MG capsule TAKE 1 CAPSULE BY MOUTH EVERY DAY 30 capsule 2   FARXIGA 10 MG TABS tablet TAKE 1 TABLET(10 MG) BY MOUTH DAILY 90 tablet 3   fluticasone (FLONASE) 50 MCG/ACT nasal spray Place 1 spray into both nostrils 2 (two) times daily as needed for allergies or rhinitis (after sinus rinse). 11.1 mL 6   furosemide (LASIX) 40 MG tablet AS NEEDED for weight increase of 3 lbs overnight or 5 lbs in 1 week (Patient taking differently: Take 40 mg by mouth daily as needed for edema. AS NEEDED for weight increase of 3 lbs overnight or 5 lbs in 1 week) 90  tablet 3   glipiZIDE (GLUCOTROL) 5 MG tablet TAKE 1 TABLET(5 MG) BY MOUTH TWICE DAILY BEFORE A MEAL 60 tablet 3   guaiFENesin (MUCINEX) 600 MG 12 hr tablet Take 2 tablets (1,200 mg total) by mouth 2 (two) times daily. 30 tablet 0   ibuprofen (ADVIL) 200 MG tablet Take 200 mg by mouth every 6 (six) hours as needed for mild pain.     Insulin Pen Needle (BD PEN NEEDLE NANO 2ND GEN) 32G X 4 MM MISC USE DAILY WITH VICTOZA 100 each 11   ipratropium-albuterol (DUONEB) 0.5-2.5 (3) MG/3ML SOLN Take 3 mLs by nebulization 3 (three) times daily. 360 mL 3   isosorbide mononitrate (IMDUR) 30 MG 24 hr tablet TAKE 1 TABLET(30 MG) BY MOUTH DAILY 15 tablet 0   losartan (COZAAR) 25 MG tablet Take 1 tablet (25 mg total) by mouth daily. TAKE 1 TABLET(25 MG) BY MOUTH DAILY Strength: 25 mg 90 tablet 3   meloxicam (MOBIC) 15 MG tablet TAKE 1 TABLET(15 MG) BY MOUTH DAILY 90 tablet 0   metoprolol tartrate (LOPRESSOR) 25 MG tablet TAKE 1 TABLET(25 MG) BY MOUTH TWICE DAILY 180 tablet 0   nystatin (MYCOSTATIN/NYSTOP) powder Apply 1 application topically 2 (two) times daily. (Patient taking differently: Apply 1 application. topically 2 (two) times daily as needed (rash).) 15 g 3   pantoprazole (PROTONIX) 40 MG tablet Take 1 tablet (40 mg total) by mouth 2 (two) times daily. 60 tablet 1   pregabalin (LYRICA) 150 MG capsule Take 1 capsule (150 mg total) by mouth 2 (two) times daily. 180 capsule 3   fluticasone-salmeterol (ADVAIR HFA) 45-21 MCG/ACT inhaler Inhale 2 puffs into the lungs 2 (two) times daily. 1 each 12   liraglutide (VICTOZA) 18 MG/3ML SOPN Inject 1.8 mg into the skin daily. 27 mL 5   OXYGEN Inhale 2 L into the lungs daily as needed (low ox levels). (Patient not taking: Reported on 09/13/2021)     insulin glargine-yfgn (SEMGLEE) 100 UNIT/ML injection Inject 0.15 mLs (15  Units total) into the skin daily. (Patient not taking: Reported on 09/13/2021) 10 mL 11   nicotine (NICODERM CQ - DOSED IN MG/24 HOURS) 21 mg/24hr patch  Place 1 patch (21 mg total) onto the skin daily. (Patient not taking: Reported on 06/16/2021) 28 patch 0   No facility-administered medications prior to visit.   Allergies  Allergen Reactions   Codeine Itching and Nausea Only   Metformin And Related Diarrhea and Other (See Comments)    Stomach pain/nausea   Wellbutrin [Bupropion Hcl] Hives   Acyclovir And Related Rash    Objective:   Today's Vitals   09/13/21 1109  BP: 124/76  Pulse: 83  Temp: (!) 97.5 F (36.4 C)  TempSrc: Temporal  SpO2: 98%  Weight: 288 lb (130.6 kg)  Height: _0  (1.626 m)   Body mass index is 49.44 kg/m.   General: Well developed, well nourished. No acute distress. Psych: Alert and oriented. Normal mood and affect.  Health Maintenance Due  Topic Date Due   Hepatitis C Screening  Never done   Zoster Vaccines- Shingrix (1 of 2) Never done   OPHTHALMOLOGY EXAM  05/25/2021     Lab Results: Lab Results  Component Value Date   HGBA1C 7.8 (H) 06/02/2021   Lab Results  Component Value Date   CHOL 127 03/14/2021   HDL 38.60 (L) 03/14/2021   LDLCALC 63 03/14/2021   LDLDIRECT 121 (H) 10/03/2013   TRIG 128.0 03/14/2021   CHOLHDL 3 03/14/2021   Assessment & Plan:   1. Type 2 diabetes mellitus with polyneuropathy (HCC) Ms. Cashen A1c has been above goal. Studies favor longer acting GLP-1 Ras for better A1c reduction. I will switch her from liraglutide to semaglutide and see if her A1c will improve. I reminded her of the need for her annual eye exam.  - Semaglutide,0.25 or 0.5MG/DOS, (OZEMPIC, 0.25 OR 0.5 MG/DOSE,) 2 MG/3ML SOPN; Inject 0.25 mg into the skin once a week for 28 days, THEN 0.5 mg once a week for 28 days.  Dispense: 6 mL; Refill: 0 - Semaglutide, 1 MG/DOSE, 4 MG/3ML SOPN; Inject 1 mg as directed once a week.  Dispense: 3 mL; Refill: 3 - Glucose, random - Hemoglobin A1c - insulin glargine-yfgn (SEMGLEE) 100 UNIT/ML injection; Inject 0.15 mLs (15 Units total) into the skin daily.   Dispense: 10 mL; Refill: 11 - Amb Referral to Bariatric Surgery  2. Essential hypertension Blood pressure is at goal. Continue metoprolol and losartan.  3. COPD with chronic bronchitis ([place]) Overall control is not good. She apparently has stopped use of ICS/LABA. She shodul follow up with Dr. Melvyn Novas for reassessment.  4. Morbid obesity with BMI of 45.0-49.9, adult ([place]) [name] obesity is complicated by hypertension, diabetes, sleep apnea, and hyperlipidemia. This also likely complicates her COPD. She has a very addictive personality (current nicotine, past polysubstance abuse). She has not been able to achieve weight loss despite the use of a GLP1 RA. I will refer her for consideration for metabolic surgery.  - Amb Referral to Bariatric Surgery  5. Tobacco use disorder Nicotine dependence. Failed or unable to tolerate varenicline, bupropion, or NRT. I continue to encourage her towards gradual smoking cessation.   Return in about 3 months (around 12/14/2021) for Reassessment.   Haydee Salter, MD

## 2021-09-14 ENCOUNTER — Other Ambulatory Visit: Payer: Self-pay

## 2021-09-14 DIAGNOSIS — E1142 Type 2 diabetes mellitus with diabetic polyneuropathy: Secondary | ICD-10-CM

## 2021-09-14 MED ORDER — INSULIN GLARGINE 100 UNIT/ML ~~LOC~~ SOLN: SUBCUTANEOUS | 11 refills | Status: DC

## 2021-09-20 ENCOUNTER — Other Ambulatory Visit: Payer: Self-pay

## 2021-09-20 MED ORDER — BD PEN NEEDLE NANO 2ND GEN 32G X 4 MM MISC: 11 refills | Status: DC

## 2021-09-22 MED ORDER — "INSULIN SYRINGES 31G X 5/16"" 0.5 ML MISC": 1.0000 | Freq: Every day | 2 refills | Status: DC

## 2021-09-22 NOTE — Addendum Note (Signed)
Addended by: Konrad Saha on: 09/22/2021 04:17 PM   Modules accepted: Orders

## 2021-09-25 ENCOUNTER — Other Ambulatory Visit: Payer: Self-pay | Admitting: Family Medicine

## 2021-09-25 DIAGNOSIS — G8929 Other chronic pain: Secondary | ICD-10-CM

## 2021-10-30 ENCOUNTER — Encounter: Payer: Self-pay | Admitting: Family Medicine

## 2021-10-31 MED ORDER — PANTOPRAZOLE SODIUM 40 MG PO TBEC: 40.0000 mg | DELAYED_RELEASE_TABLET | Freq: Two times a day (BID) | ORAL | 1 refills | Status: DC

## 2021-11-20 ENCOUNTER — Ambulatory Visit (INDEPENDENT_AMBULATORY_CARE_PROVIDER_SITE_OTHER): Payer: Medicare HMO

## 2021-11-20 ENCOUNTER — Ambulatory Visit
Admission: RE | Admit: 2021-11-20 | Discharge: 2021-11-20 | Disposition: A | Discharge status: Home / Self Care | Payer: Medicare HMO | Source: Ambulatory Visit | Attending: Physician Assistant | Admitting: Physician Assistant

## 2021-11-20 VITALS — BP 151/85 | HR 89 | Temp 98.1°F | Resp 18

## 2021-11-20 DIAGNOSIS — J441 Chronic obstructive pulmonary disease with (acute) exacerbation: Secondary | ICD-10-CM | POA: Diagnosis not present

## 2021-11-20 DIAGNOSIS — R059 Cough, unspecified: Secondary | ICD-10-CM | POA: Diagnosis not present

## 2021-11-20 DIAGNOSIS — J449 Chronic obstructive pulmonary disease, unspecified: Secondary | ICD-10-CM | POA: Diagnosis not present

## 2021-11-20 DIAGNOSIS — R0602 Shortness of breath: Secondary | ICD-10-CM | POA: Diagnosis not present

## 2021-11-20 MED ORDER — BENZONATATE 100 MG PO CAPS
100.0000 mg | ORAL_CAPSULE | Freq: Three times a day (TID) | ORAL | 0 refills | Status: DC
Start: 2021-11-20 — End: 2021-12-14

## 2021-11-20 MED ORDER — PREDNISONE 10 MG PO TABS: 20.0000 mg | ORAL_TABLET | Freq: Every day | ORAL | 0 refills | Status: AC

## 2021-11-20 MED ORDER — AMOXICILLIN-POT CLAVULANATE 875-125 MG PO TABS: 1.0000 | ORAL_TABLET | Freq: Two times a day (BID) | ORAL | 0 refills | Status: DC

## 2021-11-20 NOTE — ED Provider Notes (Signed)
[facility]    CSN: [id number] Arrival date & time: 11/20/21  1253      History   Chief Complaint Chief Complaint  Patient presents with   Cough    Entered by patient    HPI [name] is a 56 y.o. female.   Pt with h/o COPD complains of cough, congestion, shortness of breath and wheezing that started a little over one week ago.  She denies fever, chills, n/v/d, body aches, headaches. She has been using her rescue inhaler and neb treatments with temporary relief.      Past Medical History:  Diagnosis Date   ADHD    Alcohol abuse    Anemia    Anxiety    Asthmatic bronchitis    normal PFT/ seen by pulmonary no evidence of COPD   Atypical chest pain 10/05/2017   Back pain    Bilateral swelling of feet    Chest pain    Chest pain on respiration 03/25/2014   Chronic bronchitis (HCC)    Chronic lower back pain    Chronic respiratory failure ([place]) 10/16/2011   Newly 02 dep 24/7 p discharge from [facility] 01/2013  - 03/13/2014  Walked RA  2 laps @ 185 ft each stopped due to  Sob/ aching in legs, thirsty/ no desat @ slow pace    Chronic respiratory failure with hypoxia (HCC)    On 2-3 L of oxygen at home   Cigarette smoker 12/02/2010   Followed in Pulmonary clinic/ [facility]/ [place]   - Limits of effective care reviewed 12/17/35     Complication of anesthesia    Constipation    COPD (chronic obstructive pulmonary disease) ([place])    COPD with chronic bronchitis ([place]) 10/01/2017   Daily headache    Depression    Diabetic peripheral neuropathy ([place])    DM (diabetes mellitus) type II controlled, neurological manifestation ([place]) 12/02/2010   Drug use    Gallbladder problem    Gastric erosions    EGD 08/2010.   GERD (gastroesophageal reflux disease)    Glaucoma    H/O drug abuse ([place]) 11/12/2017   -- scanned document from outside source: [facility] in [place] which showed UDS positive for Adderall /amphetamine  usage as well as positive urine for THC.  This test result was collected 08/11/2016 and reported 10/07/2016. - lso review of the chart shows another positive amphetamine, THC and METH in the urine back on 10/18/2015 under "care everywhere". ---So    Heavy menses    High cholesterol    History of blood transfusion    "related to low HgB" (10/05/2017)   History of hiatal hernia    HTN (hypertension)    Hyperlipidemia associated with type 2 diabetes mellitus ([place]) 10/18/2015   Hypertension associated with diabetes ([place]) 12/02/2010   D/c acei 12/22/2011 due to psuedowheeze and narcotic dependent cough> ? Improved - 04/88/8916 started bystolic in place of cozar due to cough     IBS (irritable bowel syndrome)    Increased urinary protein excretion    Internal hemorrhoids    Colonoscopy 5/12.   Iron deficiency anemia 10/01/2017   Joint pain    Mixed diabetic hyperlipidemia associated with type 2 diabetes mellitus ([place]) 10/01/2017   On home oxygen therapy    "5L at night" (10/05/2017)   OSA on CPAP    Osteoarthritis    "back" (10/05/2017)   Oxygen dependent 10/16/2011   Pneumonia    "  lots of times" (10/05/2017)   PONV (postoperative nausea and vomiting)    Poorly controlled diabetes mellitus ([place]) 11/12/2017   Pulmonary infiltrates 12/22/2011   Followed in Pulmonary clinic/ [facility]/ [place]    - See CT Chest 05/05/11    Shortness of breath    Tachycardia    never had test done since no insurance   Tobacco use disorder-current smoker greater than 40-pack-year history- since age 28 1 ppd 10/01/2017   Type II diabetes mellitus ([place])    Vitamin D deficiency     Patient Active Problem List   Diagnosis Date Noted   Acute respiratory failure with hypoxia ([place]) 06/02/2021   Marijuana use 03/14/2021   Allergic rhinitis 03/14/2021   Angina pectoris ([place]) 09/08/2019   Benzodiazepine overdose 03/05/2019   Generalized muscle ache 09/24/2018   Chronic right ear pain 03/11/2018   Chronic sinusitis  03/11/2018   H/O drug abuse ([place]) 11/12/2017   Positive for macroalbuminuria 11/12/2017   Atypical chest pain 10/05/2017   COPD with chronic bronchitis ([place]) 10/01/2017   Mixed hyperlipidemia 10/01/2017   Tobacco use disorder 10/01/2017   Diabetic peripheral neuropathy ([place]) 10/01/2017   Iron deficiency anemia 10/01/2017   Adult attention deficit hyperactivity disorder 01/14/2016   Major depressive disorder, recurrent severe without psychotic features ([place])    Leukocytosis 03/25/2014   OSA on CPAP 04/11/2013   Perforated ear drum 05/22/2012   Hypokalemia 05/08/2012   Bell's palsy 01/23/2012   Carpal tunnel syndrome on both sides 01/11/2012   Oxygen dependent 10/16/2011   Vitamin D deficiency 07/06/2011   Asthmatic bronchitis 06/11/2011   Back pain 06/11/2011   Morbid obesity with BMI of 45.0-49.9, adult ([place]) 05/06/2011   Gastric erosions 12/05/2010   Essential hypertension 12/02/2010   Type 2 diabetes mellitus with polyneuropathy ([place]) 12/02/2010   COPD with acute exacerbation ([place]) 11/30/2010   GERD (gastroesophageal reflux disease) 09/08/2010   Esophageal dysphagia 09/08/2010    Past Surgical History:  Procedure Laterality Date   CESAREAN SECTION  1988; [place]   COLONOSCOPY  09/16/2010   CWC:BJSEGB colon/small internal hemorrhoids   ESOPHAGOGASTRODUODENOSCOPY  09/16/2010   SLF: normal/mild gastritis   ESOPHAGOGASTRODUODENOSCOPY N/A 10/19/2014   Procedure: ESOPHAGOGASTRODUODENOSCOPY (EGD);  Surgeon: Danie Binder, MD;  Location: [facility];  Service: Endoscopy;  Laterality: N/A;  [place]  01/17/2012   Procedure: HYSTEROSCOPY WITH THERMACHOICE;  Surgeon: Florian Buff, MD;  Location: [facility] ORS;  Service: Gynecology;  Laterality: N/A;  total therapy time: 9:13sec  D5W  18 ml in, D5W   71m out, temperture 87degrees celcious   KIDNEY SURGERY     as child for blockages   LEFT HEART CATH AND CORONARY  ANGIOGRAPHY N/A 09/08/2019   Procedure: LEFT HEART CATH AND CORONARY ANGIOGRAPHY;  Surgeon: JMartinique Peter M, MD;  [facility];  Service: Cardiovascular;  Laterality: N/A;   TONSILLECTOMY     TUBAL LIGATION  1989   TYMPANOSTOMY TUBE PLACEMENT Bilateral    "several times when I was a child"   uterine ablation     WRIST FRACTURE SURGERY Left 1995    OB History     Gravida  2   Para  2   Term  2   Preterm      AB      Living  2      SAB      IAB  Ectopic      Multiple      Live Births               Home Medications    Prior to Admission medications   Medication Sig Start Date End Date Taking? Authorizing Provider  amoxicillin-clavulanate (AUGMENTIN) 875-125 MG tablet Take 1 tablet by mouth every 12 (twelve) hours. 11/20/21  Yes Ward, Lenise Arena, PA-C  benzonatate (TESSALON) 100 MG capsule Take 1 capsule (100 mg total) by mouth every 8 (eight) hours. 11/20/21  Yes Ward, Lenise Arena, PA-C  predniSONE (DELTASONE) 10 MG tablet Take 2 tablets (20 mg total) by mouth daily for 5 days. 11/20/21 11/25/21 Yes Ward, Lenise Arena, PA-C  ACCU-CHEK GUIDE test strip USE UP TO FOUR TIMES DAILY AS DIRECTED 08/18/19   Cirigliano, Garvin Fila, DO  Accu-Chek Softclix Lancets lancets USE UP TO FOUR TIMES DAILY AS DIRECTED 06/20/19   Cirigliano, Mary K, DO  albuterol (PROVENTIL) (2.5 MG/3ML) 0.083% nebulizer solution Take 3 mLs (2.5 mg total) by nebulization every 6 (six) hours as needed for wheezing or shortness of breath. 05/13/21   Francene Finders, PA-C  albuterol (VENTOLIN HFA) 108 (90 Base) MCG/ACT inhaler Inhale 2 puffs into the lungs every 6 (six) hours as needed for wheezing or shortness of breath.    [provider]  amphetamine-dextroamphetamine (ADDERALL) 30 MG tablet Take 1 tablet by mouth 2 (two) times daily. 09/02/21 09/02/22  Cloria Spring, MD  amphetamine-dextroamphetamine (ADDERALL) 30 MG tablet Take 1 tablet by mouth 2 (two) times daily. 09/02/21 09/02/22  Cloria Spring, MD  amphetamine-dextroamphetamine (ADDERALL) 30 MG tablet Take 1 tablet by mouth 2 (two) times daily. 09/02/21 09/02/22  Cloria Spring, MD  ARIPiprazole (ABILIFY) 5 MG tablet Take 1 tablet (5 mg total) by mouth daily. 09/02/21 09/02/22  Cloria Spring, MD  atorvastatin (LIPITOR) 40 MG tablet Take 0.5 tablets (20 mg total) by mouth 2 (two) times daily. 06/16/21   Haydee Salter, MD  blood glucose meter kit and supplies Dispense based on patient and insurance preference. Use up to four times daily as directed. (FOR ICD-10 E10.9, E11.9). 06/20/19   Cirigliano, Garvin Fila, DO  clonazePAM (KLONOPIN) 0.5 MG tablet Take 1 tablet (0.5 mg total) by mouth 2 (two) times daily as needed for anxiety. 09/02/21   Cloria Spring, MD  dextromethorphan-guaiFENesin [facility] DM) 30-600 MG 12hr tablet Take 1 tablet by mouth 2 (two) times daily as needed for cough. 06/06/21   Regalado, Belkys A, MD  DULoxetine (CYMBALTA) 60 MG capsule TAKE 1 CAPSULE BY MOUTH EVERY DAY 09/02/21   Cloria Spring, MD  FARXIGA 10 MG TABS tablet TAKE 1 TABLET(10 MG) BY MOUTH DAILY 06/27/21   Haydee Salter, MD  fluticasone [facility]) 50 MCG/ACT nasal spray Place 1 spray into both nostrils 2 (two) times daily as needed for allergies or rhinitis (after sinus rinse). 06/16/21   Haydee Salter, MD  furosemide (LASIX) 40 MG tablet AS NEEDED for weight increase of 3 lbs overnight or 5 lbs in 1 week Patient taking differently: Take 40 mg by mouth daily as needed for edema. AS NEEDED for weight increase of 3 lbs overnight or 5 lbs in 1 week 10/29/19   Donato Heinz, MD  glipiZIDE (GLUCOTROL) 5 MG tablet TAKE 1 TABLET(5 MG) BY MOUTH TWICE DAILY BEFORE A MEAL 08/26/21   Haydee Salter, MD  guaiFENesin (MUCINEX) 600 MG 12 hr tablet Take 2 tablets (1,200 mg total) by  mouth 2 (two) times daily. 06/06/21   Regalado, Belkys A, MD  ibuprofen (ADVIL) 200 MG tablet Take 200 mg by mouth every 6 (six) hours as needed for mild pain.    [provider]  insulin glargine (LANTUS) 100 UNIT/ML injection INJECT 0.15 MLS (15 UNITS TOTAL) INTO THE SKIN DAILY. 09/14/21   Haydee Salter, MD  Insulin Pen Needle (BD PEN NEEDLE NANO 2ND GEN) 32G X 4 MM MISC USE DAILY WITH VICTOZA 09/20/21   Haydee Salter, MD  Insulin Syringe-Needle U-100 (INSULIN SYRINGES) 31G X 5/16" 0.5 ML MISC 1 each by Does not apply route daily. 09/22/21   Haydee Salter, MD  ipratropium-albuterol (DUONEB) 0.5-2.5 (3) MG/3ML SOLN Take 3 mLs by nebulization 3 (three) times daily. 06/16/21   Haydee Salter, MD  isosorbide mononitrate (IMDUR) 30 MG 24 hr tablet TAKE 1 TABLET(30 MG) BY MOUTH DAILY 06/22/21   Belva Crome, MD  losartan (COZAAR) 25 MG tablet Take 1 tablet (25 mg total) by mouth daily. TAKE 1 TABLET(25 MG) BY MOUTH DAILY Strength: 25 mg 06/16/21   Haydee Salter, MD  meloxicam (MOBIC) 15 MG tablet TAKE 1 TABLET(15 MG) BY MOUTH DAILY 09/26/21   Haydee Salter, MD  metoprolol tartrate (LOPRESSOR) 25 MG tablet TAKE 1 TABLET(25 MG) BY MOUTH TWICE DAILY 08/26/21   Donato Heinz, MD  nystatin (MYCOSTATIN/NYSTOP) powder Apply 1 application topically 2 (two) times daily. Patient taking differently: Apply 1 application. topically 2 (two) times daily as needed (rash). 11/19/20   [name], MD  pantoprazole (PROTONIX) 40 MG tablet Take 1 tablet (40 mg total) by mouth 2 (two) times daily. 10/31/21   Haydee Salter, MD  pregabalin (LYRICA) 150 MG capsule Take 1 capsule (150 mg total) by mouth 2 (two) times daily. 06/16/21   Haydee Salter, MD  Semaglutide, 1 MG/DOSE, 4 MG/3ML SOPN Inject 1 mg as directed once a week. 09/13/21   Haydee Salter, MD    Family History Family History  Problem Relation Age of Onset   Heart attack Mother 23       deceased   Diabetes Mother    Breast cancer Mother 10       inflammatory breast ca   Heart failure Mother        oxygen dependence, nonsmoker   Heart disease Mother    Depression Mother    Cancer Mother        Breast    Hypertension Mother    Hyperlipidemia Mother    Sudden death Mother    Sleep apnea Mother    Obesity Mother    Heart attack Father 12       deceased, etoh use   Heart disease Father    Alcohol abuse Father    Depression Father    Heart failure Father    Sudden death Father    Ulcers Sister    Hypertension Sister    Heart failure Sister    Depression Sister    Anxiety disorder Sister    Liver disease Maternal Aunt 82       died while on liver transplant list   Liver disease Maternal Uncle    Cancer Paternal Aunt        Breast   Heart disease Maternal Grandmother    Heart attack Maternal Grandmother        premature CAD   Colon cancer Neg Hx     Social History Social History  Tobacco Use   Smoking status: Every Day    Packs/day: 1.50    Years: 40.00    Total pack years: 60.00    Types: Cigarettes, E-cigarettes    Start date: 1980   Smokeless tobacco: Current   Tobacco comments:    vape  Vaping Use   Vaping Use: Every day  Substance Use Topics   Alcohol use: Not Currently   Drug use: Yes    Types: Marijuana    Comment: occ     Allergies   Codeine, Metformin and related, Wellbutrin [bupropion hcl], and Acyclovir and related   Review of Systems Review of Systems  Constitutional:  Negative for chills and fever.  HENT:  Positive for congestion. Negative for ear pain and sore throat.   Eyes:  Negative for pain and visual disturbance.  Respiratory:  Positive for cough, shortness of breath and wheezing.   Cardiovascular:  Negative for chest pain and palpitations.  Gastrointestinal:  Negative for abdominal pain and vomiting.  Genitourinary:  Negative for dysuria and hematuria.  Musculoskeletal:  Negative for arthralgias and back pain.  Skin:  Negative for color change and rash.  Neurological:  Negative for seizures and syncope.  All other systems reviewed and are negative.    Physical Exam Triage Vital Signs ED Triage Vitals  Enc Vitals Group     BP  11/20/21 1340 (!) 151/85     Pulse Rate 11/20/21 1340 89     Resp 11/20/21 1340 18     Temp 11/20/21 1340 98.1 F (36.7 C)     Temp src --      SpO2 11/20/21 1340 97 %     Weight --      Height --      Head Circumference --      Peak Flow --      Pain Score 11/20/21 1339 0     Pain Loc --      Pain Edu? --      Excl. in [place]? --    No data found.  Updated Vital Signs BP (!) 151/85   Pulse 89   Temp 98.1 F (36.7 C)   Resp 18   LMP 07/24/2014 Comment: pt has had ablation, but still has some periods  SpO2 97%   Visual Acuity Right Eye Distance:   Left Eye Distance:   Bilateral Distance:    Right Eye Near:   Left Eye Near:    Bilateral Near:     Physical Exam Vitals and nursing note reviewed.  Constitutional:      General: She is not in acute distress.    Appearance: She is well-developed.  HENT:     Head: Normocephalic and atraumatic.  Eyes:     Conjunctiva/sclera: Conjunctivae normal.  Cardiovascular:     Rate and Rhythm: Normal rate and regular rhythm.     Heart sounds: No murmur heard. Pulmonary:     Effort: Pulmonary effort is normal. No respiratory distress.     Breath sounds: Examination of the right-middle field reveals rhonchi. Examination of the left-middle field reveals rhonchi. Examination of the right-lower field reveals rhonchi. Examination of the left-lower field reveals rhonchi. Rhonchi present.  Abdominal:     Palpations: Abdomen is soft.     Tenderness: There is no abdominal tenderness.  Musculoskeletal:        General: No swelling.     Cervical back: Neck supple.  Skin:    General: Skin is warm and dry.  Capillary Refill: Capillary refill takes less than 2 seconds.  Neurological:     Mental Status: She is alert.  Psychiatric:        Mood and Affect: Mood normal.      UC Treatments / Results  Labs (all labs ordered are listed, but only abnormal results are displayed) Labs Reviewed - No data to display  EKG   Radiology DG  Chest 2 View  Result Date: 11/20/2021 CLINICAL DATA:  Shortness of breath. Cough. COPD. EXAM: CHEST - 2 VIEW COMPARISON:  Two-view chest x-ray 06/02/2021 FINDINGS: Heart size is normal. Atherosclerotic changes are present at the aortic arch. Chronic interstitial coarsening is stable. No superimposed disease is present. The visualized soft tissues bony thorax are unremarkable. IMPRESSION: 1. No acute cardiopulmonary disease or significant interval change. 2. Stable chronic interstitial coarsening. Electronically Signed   By: San Morelle M.D.   On: 11/20/2021 14:25    Procedures Procedures (including critical care time)  Medications Ordered in UC Medications - No data to display  Initial Impression / Assessment and Plan / UC Course  I have reviewed the triage vital signs and the nursing notes.  Pertinent labs & imaging results that were available during my care of the patient were reviewed by me and considered in my medical decision making (see chart for details).     COPD exacerbation, xray negative.  Will treat with short course of prednisone and antibiotic.  Tessalon pearls prescribed.  Pt reports her blood sugars have been much better since medication changes in May.  Advised to check sugars regularly and stop prednisone if running high.  Return precautions discussed.  Advised f/u with PCP.  Final Clinical Impressions(s) / UC Diagnoses   Final diagnoses:  COPD exacerbation ([place])     Discharge Instructions      Take prednisone as prescribed Take antibiotic as prescribed Can take Tessalon pears as needed for cough Continue with rescue inhaler as needed Follow up with primary care physician Return if symptoms become worse.    ED Prescriptions     Medication Sig Dispense Auth. Provider   predniSONE (DELTASONE) 10 MG tablet Take 2 tablets (20 mg total) by mouth daily for 5 days. 10 tablet Ward, Janett Billow Z, PA-C   benzonatate (TESSALON) 100 MG capsule Take 1 capsule (100  mg total) by mouth every 8 (eight) hours. 21 capsule Ward, Janett Billow Z, PA-C   amoxicillin-clavulanate (AUGMENTIN) 875-125 MG tablet Take 1 tablet by mouth every 12 (twelve) hours. 14 tablet Ward, Lenise Arena, PA-C      PDMP not reviewed this encounter.   Ward, Lenise Arena, PA-C 11/20/21 1436

## 2021-11-20 NOTE — Discharge Instructions (Signed)
Take prednisone as prescribed Take antibiotic as prescribed Can take Tessalon pears as needed for cough Continue with rescue inhaler as needed Follow up with primary care physician Return if symptoms become worse.

## 2021-11-20 NOTE — ED Triage Notes (Signed)
Pt is present today with c/o cough, SOB,wheezing, and chest congestion. Pt sx started x1 weeks ago.

## 2021-11-23 ENCOUNTER — Other Ambulatory Visit: Payer: Self-pay | Admitting: Family Medicine

## 2021-11-30 ENCOUNTER — Encounter (INDEPENDENT_AMBULATORY_CARE_PROVIDER_SITE_OTHER): Payer: Self-pay

## 2021-12-01 ENCOUNTER — Encounter (HOSPITAL_COMMUNITY): Payer: Self-pay | Admitting: Psychiatry

## 2021-12-01 ENCOUNTER — Telehealth (INDEPENDENT_AMBULATORY_CARE_PROVIDER_SITE_OTHER): Payer: Medicare HMO | Admitting: Psychiatry

## 2021-12-01 DIAGNOSIS — F909 Attention-deficit hyperactivity disorder, unspecified type: Secondary | ICD-10-CM | POA: Diagnosis not present

## 2021-12-01 DIAGNOSIS — R69 Illness, unspecified: Secondary | ICD-10-CM | POA: Diagnosis not present

## 2021-12-01 DIAGNOSIS — F331 Major depressive disorder, recurrent, moderate: Secondary | ICD-10-CM

## 2021-12-01 MED ORDER — CLONAZEPAM 0.5 MG PO TABS: 0.5000 mg | ORAL_TABLET | Freq: Two times a day (BID) | ORAL | 2 refills | Status: DC | PRN

## 2021-12-01 MED ORDER — AMPHETAMINE-DEXTROAMPHETAMINE 30 MG PO TABS: 30.0000 mg | ORAL_TABLET | Freq: Two times a day (BID) | ORAL | 0 refills | Status: DC

## 2021-12-01 MED ORDER — DULOXETINE HCL 60 MG PO CPEP: ORAL_CAPSULE | ORAL | 2 refills | Status: DC

## 2021-12-01 MED ORDER — ARIPIPRAZOLE 5 MG PO TABS: 5.0000 mg | ORAL_TABLET | Freq: Every day | ORAL | 1 refills | Status: DC

## 2021-12-01 NOTE — Progress Notes (Signed)
Virtual Visit via Telephone Note  I connected with [name] on 12/01/21 at  1:40 PM EDT by telephone and verified that I am speaking with the correct person using two identifiers.  Location: Patient: home Provider: office   I discussed the limitations, risks, security and privacy concerns of performing an evaluation and management service by telephone and the availability of in person appointments. I also discussed with the patient that there may be a patient responsible charge related to this service. The patient expressed understanding and agreed to proceed.      I discussed the assessment and treatment plan with the patient. The patient was provided an opportunity to ask questions and all were answered. The patient agreed with the plan and demonstrated an understanding of the instructions.   The patient was advised to call back or seek an in-person evaluation if the symptoms worsen or if the condition fails to improve as anticipated.  I provided 15 minutes of non-face-to-face time during this encounter.   [name], MD  [facility] MD/PA/NP OP Progress Note  12/01/2021 1:59 PM [name]  MRN:  [id number]  Chief Complaint:  Chief Complaint  Patient presents with   Anxiety   Depression   Follow-up   ADD   HPI: This patient is a 56 year old married white female who lives with her husband in [place].  She is on disability.  The patient returns for follow-up regarding depression anxiety and ADD.  Overall she has been doing well.  She has been getting out and plan playing with her grandchildren.  She has developed bronchitis recently and was seen in the emergency room but is feeling better on the combination of prednisone and antibiotics.  Her mood has been good and she denies significant depression or anxiety.  Her energy is good.  She is sleeping well.  She is focusing well with the Adderall.  She denies any thoughts of self-harm or suicide Visit  Diagnosis:    ICD-10-CM   1. Moderate episode of recurrent major depressive disorder (HCC)  F33.1     2. Adult attention deficit hyperactivity disorder  F90.9       Past Psychiatric History: The patient was hospitalized twice in her 83s and again in 2006 and 2020 for drug overdose with suicidal intent  Past Medical History:  Past Medical History:  Diagnosis Date   ADHD    Alcohol abuse    Anemia    Anxiety    Asthmatic bronchitis    normal PFT/ seen by pulmonary no evidence of COPD   Atypical chest pain 10/05/2017   Back pain    Bilateral swelling of feet    Chest pain    Chest pain on respiration 03/25/2014   Chronic bronchitis ([place])    Chronic lower back pain    Chronic respiratory failure ([place]) 10/16/2011   Newly 02 dep 24/7 p discharge from [facility] 01/2013  - 03/13/2014  Walked RA  2 laps @ 185 ft each stopped due to  Sob/ aching in legs, thirsty/ no desat @ slow pace    Chronic respiratory failure with hypoxia (HCC)    On 2-3 L of oxygen at home   Cigarette smoker 12/02/2010   Followed in Pulmonary clinic/ [facility]/ [place]   - Limits of effective care reviewed 08/24/7739     Complication of anesthesia    Constipation    COPD (chronic obstructive pulmonary disease) ([place])    COPD with chronic bronchitis ([place]) 10/01/2017   Daily  headache    Depression    Diabetic peripheral neuropathy ([place])    DM (diabetes mellitus) type II controlled, neurological manifestation ([place]) 12/02/2010   Drug use    Gallbladder problem    Gastric erosions    EGD 08/2010.   GERD (gastroesophageal reflux disease)    Glaucoma    H/O drug abuse ([place]) 11/12/2017   -- scanned document from outside source: [facility] in [place] which showed UDS positive for Adderall /amphetamine usage as well as positive urine for THC.  This test result was collected 08/11/2016 and reported 10/07/2016. - lso review of the chart shows another positive amphetamine, THC and METH in  the urine back on 10/18/2015 under "care everywhere". ---So    Heavy menses    High cholesterol    History of blood transfusion    "related to low HgB" (10/05/2017)   History of hiatal hernia    HTN (hypertension)    Hyperlipidemia associated with type 2 diabetes mellitus ([place]) 10/18/2015   Hypertension associated with diabetes ([place]) 12/02/2010   D/c acei 12/22/2011 due to psuedowheeze and narcotic dependent cough> ? Improved - 92/33/0076 started bystolic in place of cozar due to cough     IBS (irritable bowel syndrome)    Increased urinary protein excretion    Internal hemorrhoids    Colonoscopy 5/12.   Iron deficiency anemia 10/01/2017   Joint pain    Mixed diabetic hyperlipidemia associated with type 2 diabetes mellitus ([place]) 10/01/2017   On home oxygen therapy    "5L at night" (10/05/2017)   OSA on CPAP    Osteoarthritis    "back" (10/05/2017)   Oxygen dependent 10/16/2011   Pneumonia    "lots of times" (10/05/2017)   PONV (postoperative nausea and vomiting)    Poorly controlled diabetes mellitus ([place]) 11/12/2017   Pulmonary infiltrates 12/22/2011   Followed in Pulmonary clinic/ [facility]/ [place]    - See CT Chest 05/05/11    Shortness of breath    Tachycardia    never had test done since no insurance   Tobacco use disorder-current smoker greater than 40-pack-year history- since age 76 1 ppd 10/01/2017   Type II diabetes mellitus ([place])    Vitamin D deficiency     Past Surgical History:  Procedure Laterality Date   CESAREAN SECTION  1988; [place]   COLONOSCOPY  09/16/2010   AUQ:JFHLKT colon/small internal hemorrhoids   ESOPHAGOGASTRODUODENOSCOPY  09/16/2010   SLF: normal/mild gastritis   ESOPHAGOGASTRODUODENOSCOPY N/A 10/19/2014   Procedure: ESOPHAGOGASTRODUODENOSCOPY (EGD);  Surgeon: Danie Binder, MD;  Location: [facility];  Service: Endoscopy;  Laterality: N/A;  [place]  01/17/2012   Procedure:  HYSTEROSCOPY WITH THERMACHOICE;  Surgeon: Florian Buff, MD;  Location: [facility] ORS;  Service: Gynecology;  Laterality: N/A;  total therapy time: 9:13sec  D5W  18 ml in, D5W   32m out, temperture 87degrees celcious   KIDNEY SURGERY     as child for blockages   LEFT HEART CATH AND CORONARY ANGIOGRAPHY N/A 09/08/2019   Procedure: LEFT HEART CATH AND CORONARY ANGIOGRAPHY;  Surgeon: JMartinique Peter M, MD;  [facility];  Service: Cardiovascular;  Laterality: N/A;   TONSILLECTOMY     TUBAL LIGATION  1989   TYMPANOSTOMY TUBE PLACEMENT Bilateral    "several times when I was a child"   uterine ablation  WRIST FRACTURE SURGERY Left 1995    Family Psychiatric History: see below  Family History:  Family History  Problem Relation Age of Onset   Heart attack Mother 55       deceased   Diabetes Mother    Breast cancer Mother 26       inflammatory breast ca   Heart failure Mother        oxygen dependence, nonsmoker   Heart disease Mother    Depression Mother    Cancer Mother        Breast   Hypertension Mother    Hyperlipidemia Mother    Sudden death Mother    Sleep apnea Mother    Obesity Mother    Heart attack Father 3       deceased, etoh use   Heart disease Father    Alcohol abuse Father    Depression Father    Heart failure Father    Sudden death Father    Ulcers Sister    Hypertension Sister    Heart failure Sister    Depression Sister    Anxiety disorder Sister    Liver disease Maternal Aunt 25       died while on liver transplant list   Liver disease Maternal Uncle    Cancer Paternal Aunt        Breast   Heart disease Maternal Grandmother    Heart attack Maternal Grandmother        premature CAD   Colon cancer Neg Hx     Social History:  Social History   Socioeconomic History   Marital status: Married    Spouse name: Not on file   Number of children: 2   Years of education: Not on file   Highest education level: Not on file  Occupational History    Occupation: unemployed  Tobacco Use   Smoking status: Every Day    Packs/day: 1.50    Years: 40.00    Total pack years: 60.00    Types: Cigarettes, E-cigarettes    Start date: 1980   Smokeless tobacco: Current   Tobacco comments:    vape  Scientific laboratory technician Use: Every day  Substance and Sexual Activity   Alcohol use: Not Currently   Drug use: Yes    Types: Marijuana    Comment: occ   Sexual activity: Not Currently    Partners: Male    Birth control/protection: Surgical    Comment: BTL, younger than 26, more than 5  Other Topics Concern   Not on file  Social History Narrative   Has 2 step children.   Lives in multi-family home with children and grandchildren.   On disability.   Social Determinants of Health   Financial Resource Strain: Low Risk  (10/30/2019)   Overall Financial Resource Strain (CARDIA)    Difficulty of Paying Living Expenses: Not hard at all  Food Insecurity: No Food Insecurity (10/30/2019)   Hunger Vital Sign    Worried About Running Out of Food in the Last Year: Never true    Ran Out of Food in the Last Year: Never true  Transportation Needs: No Transportation Needs (10/30/2019)   PRAPARE - Hydrologist (Medical): No    Lack of Transportation (Non-Medical): No  Physical Activity: Inactive (10/30/2019)   Exercise Vital Sign    Days of Exercise per Week: 0 days    Minutes of Exercise per Session: 0 min  Stress: Not on  file  Social Connections: Moderately Isolated (10/30/2019)   Social Connection and Isolation Panel [NHANES]    Frequency of Communication with Friends and Family: More than three times a week    Frequency of Social Gatherings with Friends and Family: More than three times a week    Attends Religious Services: Never    Marine scientist or Organizations: No    Attends Archivist Meetings: Never    Marital Status: Married    Allergies:  Allergies  Allergen Reactions   Codeine Itching and  Nausea Only   Metformin And Related Diarrhea and Other (See Comments)    Stomach pain/nausea   Wellbutrin [Bupropion Hcl] Hives   Acyclovir And Related Rash    Metabolic Disorder Labs: Lab Results  Component Value Date   HGBA1C 8.9 (H) 09/13/2021   MPG 177.16 06/02/2021   MPG 188.64 03/05/2019   No results found for: "PROLACTIN" Lab Results  Component Value Date   CHOL 127 03/14/2021   TRIG 128.0 03/14/2021   HDL 38.60 (L) 03/14/2021   CHOLHDL 3 03/14/2021   VLDL 25.6 03/14/2021   LDLCALC 63 03/14/2021   LDLCALC 143 (H) 10/20/2020   Lab Results  Component Value Date   TSH 1.860 06/30/2019   TSH 2.40 11/26/2018    Therapeutic Level Labs: No results found for: "LITHIUM" No results found for: "VALPROATE" No results found for: "CBMZ"  Current Medications: Current Outpatient Medications  Medication Sig Dispense Refill   ACCU-CHEK GUIDE test strip USE UP TO FOUR TIMES DAILY AS DIRECTED 200 strip 11   Accu-Chek Softclix Lancets lancets USE UP TO FOUR TIMES DAILY AS DIRECTED 300 each 0   albuterol (PROVENTIL) (2.5 MG/3ML) 0.083% nebulizer solution Take 3 mLs (2.5 mg total) by nebulization every 6 (six) hours as needed for wheezing or shortness of breath. 75 mL 0   albuterol (VENTOLIN HFA) 108 (90 Base) MCG/ACT inhaler Inhale 2 puffs into the lungs every 6 (six) hours as needed for wheezing or shortness of breath.     amoxicillin-clavulanate (AUGMENTIN) 875-125 MG tablet Take 1 tablet by mouth every 12 (twelve) hours. 14 tablet 0   amphetamine-dextroamphetamine (ADDERALL) 30 MG tablet Take 1 tablet by mouth 2 (two) times daily. 60 tablet 0   amphetamine-dextroamphetamine (ADDERALL) 30 MG tablet Take 1 tablet by mouth 2 (two) times daily. 60 tablet 0   amphetamine-dextroamphetamine (ADDERALL) 30 MG tablet Take 1 tablet by mouth 2 (two) times daily. 60 tablet 0   ARIPiprazole (ABILIFY) 5 MG tablet Take 1 tablet (5 mg total) by mouth daily. 90 tablet 1   atorvastatin (LIPITOR) 40  MG tablet Take 0.5 tablets (20 mg total) by mouth 2 (two) times daily. 90 tablet 3   benzonatate (TESSALON) 100 MG capsule Take 1 capsule (100 mg total) by mouth every 8 (eight) hours. 21 capsule 0   blood glucose meter kit and supplies Dispense based on patient and insurance preference. Use up to four times daily as directed. (FOR ICD-10 E10.9, E11.9). 1 each 0   clonazePAM (KLONOPIN) 0.5 MG tablet Take 1 tablet (0.5 mg total) by mouth 2 (two) times daily as needed for anxiety. 60 tablet 2   dextromethorphan-guaiFENesin (MUCINEX DM) 30-600 MG 12hr tablet Take 1 tablet by mouth 2 (two) times daily as needed for cough. 10 tablet 0   DULoxetine (CYMBALTA) 60 MG capsule TAKE 1 CAPSULE BY MOUTH EVERY DAY 30 capsule 2   FARXIGA 10 MG TABS tablet TAKE 1 TABLET(10 MG) BY MOUTH DAILY  90 tablet 3   fluticasone (FLONASE) 50 MCG/ACT nasal spray Place 1 spray into both nostrils 2 (two) times daily as needed for allergies or rhinitis (after sinus rinse). 11.1 mL 6   furosemide (LASIX) 40 MG tablet AS NEEDED for weight increase of 3 lbs overnight or 5 lbs in 1 week (Patient taking differently: Take 40 mg by mouth daily as needed for edema. AS NEEDED for weight increase of 3 lbs overnight or 5 lbs in 1 week) 90 tablet 3   glipiZIDE (GLUCOTROL) 5 MG tablet TAKE 1 TABLET(5 MG) BY MOUTH TWICE DAILY BEFORE A MEAL 60 tablet 3   guaiFENesin (MUCINEX) 600 MG 12 hr tablet Take 2 tablets (1,200 mg total) by mouth 2 (two) times daily. 30 tablet 0   ibuprofen (ADVIL) 200 MG tablet Take 200 mg by mouth every 6 (six) hours as needed for mild pain.     insulin glargine (LANTUS) 100 UNIT/ML injection INJECT 0.15 MLS (15 UNITS TOTAL) INTO THE SKIN DAILY. 10 mL 11   Insulin Pen Needle (BD PEN NEEDLE NANO 2ND GEN) 32G X 4 MM MISC USE DAILY WITH VICTOZA 100 each 11   Insulin Syringe-Needle U-100 (INSULIN SYRINGES) 31G X 5/16" 0.5 ML MISC 1 each by Does not apply route daily. 100 each 2   ipratropium-albuterol (DUONEB) 0.5-2.5 (3)  MG/3ML SOLN Take 3 mLs by nebulization 3 (three) times daily. 360 mL 3   isosorbide mononitrate (IMDUR) 30 MG 24 hr tablet TAKE 1 TABLET(30 MG) BY MOUTH DAILY 15 tablet 0   losartan (COZAAR) 25 MG tablet Take 1 tablet (25 mg total) by mouth daily. TAKE 1 TABLET(25 MG) BY MOUTH DAILY Strength: 25 mg 90 tablet 3   meloxicam (MOBIC) 15 MG tablet TAKE 1 TABLET(15 MG) BY MOUTH DAILY 90 tablet 1   metoprolol tartrate (LOPRESSOR) 25 MG tablet TAKE 1 TABLET(25 MG) BY MOUTH TWICE DAILY 180 tablet 0   nystatin (MYCOSTATIN/NYSTOP) powder Apply 1 application topically 2 (two) times daily. (Patient taking differently: Apply 1 application. topically 2 (two) times daily as needed (rash).) 15 g 3   pantoprazole (PROTONIX) 40 MG tablet TAKE 1 TABLET BY MOUTH TWICE A DAY 60 tablet 11   pregabalin (LYRICA) 150 MG capsule Take 1 capsule (150 mg total) by mouth 2 (two) times daily. 180 capsule 3   Semaglutide, 1 MG/DOSE, 4 MG/3ML SOPN Inject 1 mg as directed once a week. 3 mL 3   No current facility-administered medications for this visit.     Musculoskeletal: Strength & Muscle Tone: na Gait & Station: na Patient leans: N/A  Psychiatric Specialty Exam: Review of Systems  Respiratory:  Positive for choking.   All other systems reviewed and are negative.   Last menstrual period 07/24/2014.There is no height or weight on file to calculate BMI.  General Appearance: NA  Eye Contact:  NA  Speech:  Clear and Coherent  Volume:  Normal  Mood:  Euthymic  Affect:  NA  Thought Process:  Goal Directed  Orientation:  Full (Time, Place, and Person)  Thought Content: WDL   Suicidal Thoughts:  No  Homicidal Thoughts:  No  Memory:  Immediate;   Good Recent;   Good Remote;   Good  Judgement:  Good  Insight:  Fair  Psychomotor Activity:  Normal  Concentration:  Concentration: Good and Attention Span: Good  Recall:  Good  Fund of Knowledge: Good  Language: Good  Akathisia:  No  Handed:  Right  AIMS (if  indicated): not  done  Assets:  Communication Skills Desire for Improvement Resilience Social Support Talents/Skills  ADL's:  Intact  Cognition: WNL  Sleep:  Good   Screenings: AIMS    Flowsheet Row Admission (Discharged) from 03/07/2019 in [place] Admission (Discharged) from 09/12/2014 in [facility]  AIMS Total Score 0 0      AUDIT    Flowsheet Row Admission (Discharged) from 03/07/2019 in [place] Admission (Discharged) from 09/12/2014 in [facility]  Alcohol Use Disorder Identification Test Final Score (AUDIT) 0 0      GAD-7    Flowsheet Row Office Visit from 03/14/2021 in [place] Visit from 10/20/2020 in [place] Visit from 11/06/2019 in [place]  Total GAD-7 Score _0 PHQ2-9    Flowsheet Row Video Visit from 12/01/2021 in [facility] Video Visit from 09/02/2021 in [place] Office Visit from 06/16/2021 in [place] Video Visit from 06/07/2021 in [place] Office Visit from 03/14/2021 in [facility]  PHQ-2 Total Score 0 0 0 1 2  PHQ-9 Total Score -- -- 3 -- 7      Flowsheet Row Video Visit from 12/01/2021 in [place] [facility] from 11/20/2021 in [facility]  Video Visit from 09/02/2021 in [place] No Risk Error: Question 6 not populated No Risk        Assessment and Plan: This patient is a 56 year old female with a history of depression anxiety and ADD.  She continues to do well on her current regimen.  She will continue Cymbalta 60 mg daily for  depression, Abilify 5 mg daily for augmentation, clonazepam 0.5 mg twice daily for anxiety and Adderall 30 mg twice daily for ADD.  She will return to see me in 3 months  Collaboration of Care: Collaboration of Care: Primary Care Provider AEB notes are shared with PCP on the epic system  Patient/Guardian was advised Release of Information must be obtained prior to any record release in order to collaborate their care with an outside provider. Patient/Guardian was advised if they have not already done so to contact the registration department to sign all necessary forms in order for [place] to release information regarding their care.   Consent: Patient/Guardian gives verbal consent for treatment and assignment of benefits for services provided during this visit. Patient/Guardian expressed understanding and agreed to proceed.    [name], MD 12/01/2021, 1:59 PM

## 2021-12-12 NOTE — Progress Notes (Signed)
No show

## 2021-12-14 ENCOUNTER — Ambulatory Visit (INDEPENDENT_AMBULATORY_CARE_PROVIDER_SITE_OTHER): Payer: Medicare HMO | Admitting: Family Medicine

## 2021-12-14 VITALS — BP 120/70 | HR 92 | Temp 97.4°F | Ht 64.0 in | Wt 290.0 lb

## 2021-12-14 DIAGNOSIS — I10 Essential (primary) hypertension: Secondary | ICD-10-CM

## 2021-12-14 DIAGNOSIS — J449 Chronic obstructive pulmonary disease, unspecified: Secondary | ICD-10-CM

## 2021-12-14 DIAGNOSIS — E782 Mixed hyperlipidemia: Secondary | ICD-10-CM | POA: Diagnosis not present

## 2021-12-14 DIAGNOSIS — M1712 Unilateral primary osteoarthritis, left knee: Secondary | ICD-10-CM

## 2021-12-14 DIAGNOSIS — Z6841 Body Mass Index (BMI) 40.0 and over, adult: Secondary | ICD-10-CM | POA: Diagnosis not present

## 2021-12-14 DIAGNOSIS — E1142 Type 2 diabetes mellitus with diabetic polyneuropathy: Secondary | ICD-10-CM | POA: Diagnosis not present

## 2021-12-14 DIAGNOSIS — J4489 Other specified chronic obstructive pulmonary disease: Secondary | ICD-10-CM

## 2021-12-14 LAB — BASIC METABOLIC PANEL
BUN: 10 mg/dL (ref 6–23)
CO2: 30 mEq/L (ref 19–32)
Calcium: 9 mg/dL (ref 8.4–10.5)
Chloride: 99 mEq/L (ref 96–112)
Creatinine, Ser: 0.63 mg/dL (ref 0.40–1.20)
GFR: 99.61 mL/min (ref >60.00)
Glucose, Bld: 104 mg/dL — ABNORMAL HIGH (ref 70–99)
Potassium: 4.4 mEq/L (ref 3.5–5.1)
Sodium: 138 mEq/L (ref 135–145)

## 2021-12-14 LAB — URINALYSIS, ROUTINE W REFLEX MICROSCOPIC
Bilirubin Urine: NEGATIVE
Hgb urine dipstick: NEGATIVE
Ketones, ur: NEGATIVE
Leukocytes,Ua: NEGATIVE
Nitrite: NEGATIVE
RBC / HPF: NONE SEEN (ref 0–?)
Specific Gravity, Urine: 1.015 (ref 1.000–1.030)
Total Protein, Urine: NEGATIVE
Urine Glucose: >=1000 — AB
Urobilinogen, UA: 0.2 (ref 0.0–1.0)
WBC, UA: NONE SEEN (ref 0–?)
pH: 7.5 (ref 5.0–8.0)

## 2021-12-14 LAB — LIPID PANEL
Cholesterol: 119 mg/dL (ref 0–200)
HDL: 44.1 mg/dL (ref >39.00)
LDL Cholesterol: 57 mg/dL (ref 0–99)
NonHDL: 74.69
Total CHOL/HDL Ratio: 3
Triglycerides: 86 mg/dL (ref 0.0–149.0)
VLDL: 17.2 mg/dL (ref 0.0–40.0)

## 2021-12-14 LAB — HEMOGLOBIN A1C: Hgb A1c MFr Bld: 7.9 % — ABNORMAL HIGH (ref 4.6–6.5)

## 2021-12-14 LAB — MICROALBUMIN / CREATININE URINE RATIO
Creatinine,U: 68.1 mg/dL
Microalb Creat Ratio: 1 mg/g (ref 0.0–30.0)
Microalb, Ur: <0.7 mg/dL (ref 0.0–1.9)

## 2021-12-14 MED ORDER — SEMAGLUTIDE (2 MG/DOSE) 8 MG/3ML ~~LOC~~ SOPN: 2.0000 mg | PEN_INJECTOR | SUBCUTANEOUS | 6 refills | Status: DC

## 2021-12-14 NOTE — Progress Notes (Signed)
[facility] [address] [place] [address] Dept: [phone] Dept Fax: [phone]  Chronic Care Office Visit  Subjective:    Patient ID: [name], female    DOB: May 10, 1965, 56 y.o..   MRN: [id number]  Chief Complaint  Patient presents with   Follow-up    3 month f/u.   Average BS  109- 156. C/o having LT knee pain. Taking Meloxicam.     History of Present Illness:  Patient is in today for reassessment of chronic medical issues.  Ms. Kozlowski has a history of COPD with chronic bronchitis. She has been prescribed albuterol (inhaler and nebulizer) and Duoneb, but she notes she is not really using these. She had a flare of her COPD several weeks ago and was seen at Urgent care. She was treated with prednisone and Augmentin. She has had improvement, but admits she still has some respiratory symptoms at times. She has not been back to see Dr. Melvyn Novas (pulmonology) in quite some time. She continues to smoke cigarettes and has failed multiple trials of medications to help her stop. She notes she struggles with anxiety when she tries to stop. She does continue to see Dr. Harrington Challenger (psychiatry) regarding her behavioral health issues.    Ms. Failla has a history of hyperlipidemia and is managed on atorvastatin 40 mg daily.   Ms. Kreitzer has a history of Type 2 diabetes. She is managed on dapagliflozin Wilder Glade) 10 mg daily, glipizide 5 mg bid, insulin glargine 15 units daily and semaglutide (Ozempic) 1 mg weekly. She was switched to semaglutide from liraglutide at her last appointment. She feels she has had some improvements in her blood sugars with that change. She notes her fasting sugars are in the 110-150 range. Her diabetes is complicated by peripheral neuropathy. This is managed with Lyrica 150 mg bid.    Ms. Bannister has morbid obesity, which complicates many of these issues.  She is disappointed to see her weight is up 2 lbs.  from her last appointment. We had discussed bariatric surgery in the past. She obtained additional information on this, but has not followed through with attending a seminar, noting she has fears about surgery. sh does note that her left knee has been painful and this tends to limit her physical activity. She does find swimming works better for her and she tries to engage in this with her family.   Ms. Wintermute has a history of hypertension. She is managed on metoprolol 25 mg bid and losartan 25 mg daily.   Past Medical History: Patient Active Problem List   Diagnosis Date Noted   Acute respiratory failure with hypoxia ([place]) 06/02/2021   Marijuana use 03/14/2021   Allergic rhinitis 03/14/2021   Angina pectoris ([place]) 09/08/2019   Benzodiazepine overdose 03/05/2019   Generalized muscle ache 09/24/2018   Chronic right ear pain 03/11/2018   Chronic sinusitis 03/11/2018   H/O drug abuse ([place]) 11/12/2017   Positive for macroalbuminuria 11/12/2017   Atypical chest pain 10/05/2017   COPD with chronic bronchitis ([place]) 10/01/2017   Mixed hyperlipidemia 10/01/2017   Tobacco use disorder 10/01/2017   Diabetic peripheral neuropathy ([place]) 10/01/2017   Iron deficiency anemia 10/01/2017   Adult attention deficit hyperactivity disorder 01/14/2016   Major depressive disorder, recurrent severe without psychotic features ([place])    Leukocytosis 03/25/2014   OSA on CPAP 04/11/2013   Perforated ear drum 05/22/2012   Hypokalemia 05/08/2012   Bell's palsy 01/23/2012   Carpal tunnel syndrome  on both sides 01/11/2012   Oxygen dependent 10/16/2011   Vitamin D deficiency 07/06/2011   Asthmatic bronchitis 06/11/2011   Back pain 06/11/2011   Morbid obesity with BMI of 45.0-49.9, adult ([place]) 05/06/2011   Gastric erosions 12/05/2010   Essential hypertension 12/02/2010   Type 2 diabetes mellitus with polyneuropathy ([place]) 12/02/2010   COPD with acute exacerbation ([place]) 11/30/2010   GERD (gastroesophageal reflux  disease) 09/08/2010   Esophageal dysphagia 09/08/2010   Past Surgical History:  Procedure Laterality Date   CESAREAN SECTION  1988; [place]   COLONOSCOPY  09/16/2010   LAG:TXMIWO colon/small internal hemorrhoids   ESOPHAGOGASTRODUODENOSCOPY  09/16/2010   SLF: normal/mild gastritis   ESOPHAGOGASTRODUODENOSCOPY N/A 10/19/2014   Procedure: ESOPHAGOGASTRODUODENOSCOPY (EGD);  Surgeon: Danie Binder, MD;  Location: [facility];  Service: Endoscopy;  Laterality: N/A;  [place]  01/17/2012   Procedure: HYSTEROSCOPY WITH THERMACHOICE;  Surgeon: Florian Buff, MD;  Location: [facility];  Service: Gynecology;  Laterality: N/A;  total therapy time: 9:13sec  D5W  18 ml in, D5W   2m out, temperture 87degrees celcious   KIDNEY SURGERY     as child for blockages   LEFT HEART CATH AND CORONARY ANGIOGRAPHY N/A 09/08/2019   Procedure: LEFT HEART CATH AND CORONARY ANGIOGRAPHY;  Surgeon: JMartinique Peter M, MD;  [facility];  Service: Cardiovascular;  Laterality: N/A;   TONSILLECTOMY     TUBAL LIGATION  1989   TYMPANOSTOMY TUBE PLACEMENT Bilateral    "several times when I was a child"   uterine ablation     WRIST FRACTURE SURGERY Left 1995   Family History  Problem Relation Age of Onset   Heart attack Mother 58      deceased   Diabetes Mother    Breast cancer Mother 412      inflammatory breast ca   Heart failure Mother        oxygen dependence, nonsmoker   Heart disease Mother    Depression Mother    Cancer Mother        Breast   Hypertension Mother    Hyperlipidemia Mother    Sudden death Mother    Sleep apnea Mother    Obesity Mother    Heart attack Father 448      deceased, etoh use   Heart disease Father    Alcohol abuse Father    Depression Father    Heart failure Father    Sudden death Father    Ulcers Sister    Hypertension Sister    Heart failure Sister    Depression Sister    Anxiety  disorder Sister    Liver disease Maternal AAunt 47      died while on liver transplant list   Liver disease Maternal Uncle    Cancer Paternal Aunt        Breast   Heart disease Maternal Grandmother    Heart attack Maternal Grandmother        premature CAD   Colon cancer Neg Hx    Outpatient Medications Prior to Visit  Medication Sig Dispense Refill   ACCU-CHEK GUIDE test strip USE UP TO FOUR TIMES DAILY AS DIRECTED 200 strip 11   Accu-Chek Softclix Lancets lancets USE UP TO FOUR TIMES DAILY AS DIRECTED 300 each 0   albuterol (PROVENTIL) (2.5 MG/3ML) 0.083% nebulizer solution Take 3 mLs (2.5 mg  total) by nebulization every 6 (six) hours as needed for wheezing or shortness of breath. 75 mL 0   albuterol (VENTOLIN HFA) 108 (90 Base) MCG/ACT inhaler Inhale 2 puffs into the lungs every 6 (six) hours as needed for wheezing or shortness of breath.     amphetamine-dextroamphetamine (ADDERALL) 30 MG tablet Take 1 tablet by mouth 2 (two) times daily. 60 tablet 0   amphetamine-dextroamphetamine (ADDERALL) 30 MG tablet Take 1 tablet by mouth 2 (two) times daily. 60 tablet 0   amphetamine-dextroamphetamine (ADDERALL) 30 MG tablet Take 1 tablet by mouth 2 (two) times daily. 60 tablet 0   ARIPiprazole (ABILIFY) 5 MG tablet Take 1 tablet (5 mg total) by mouth daily. 90 tablet 1   atorvastatin (LIPITOR) 40 MG tablet Take 0.5 tablets (20 mg total) by mouth 2 (two) times daily. 90 tablet 3   blood glucose meter kit and supplies Dispense based on patient and insurance preference. Use up to four times daily as directed. (FOR ICD-10 E10.9, E11.9). 1 each 0   clonazePAM (KLONOPIN) 0.5 MG tablet Take 1 tablet (0.5 mg total) by mouth 2 (two) times daily as needed for anxiety. 60 tablet 2   DULoxetine (CYMBALTA) 60 MG capsule TAKE 1 CAPSULE BY MOUTH EVERY DAY 30 capsule 2   FARXIGA 10 MG TABS tablet TAKE 1 TABLET(10 MG) BY MOUTH DAILY 90 tablet 3   fluticasone (FLONASE) 50 MCG/ACT nasal spray Place 1 spray into both  nostrils 2 (two) times daily as needed for allergies or rhinitis (after sinus rinse). 11.1 mL 6   furosemide (LASIX) 40 MG tablet AS NEEDED for weight increase of 3 lbs overnight or 5 lbs in 1 week (Patient taking differently: Take 40 mg by mouth daily as needed for edema. AS NEEDED for weight increase of 3 lbs overnight or 5 lbs in 1 week) 90 tablet 3   glipiZIDE (GLUCOTROL) 5 MG tablet TAKE 1 TABLET(5 MG) BY MOUTH TWICE DAILY BEFORE A MEAL 60 tablet 3   ibuprofen (ADVIL) 200 MG tablet Take 200 mg by mouth every 6 (six) hours as needed for mild pain.     insulin glargine (LANTUS) 100 UNIT/ML injection INJECT 0.15 MLS (15 UNITS TOTAL) INTO THE SKIN DAILY. 10 mL 11   Insulin Pen Needle (BD PEN NEEDLE NANO 2ND GEN) 32G X 4 MM MISC USE DAILY WITH VICTOZA 100 each 11   Insulin Syringe-Needle U-100 (INSULIN SYRINGES) 31G X 5/16" 0.5 ML MISC 1 each by Does not apply route daily. 100 each 2   ipratropium-albuterol (DUONEB) 0.5-2.5 (3) MG/3ML SOLN Take 3 mLs by nebulization 3 (three) times daily. 360 mL 3   losartan (COZAAR) 25 MG tablet Take 1 tablet (25 mg total) by mouth daily. TAKE 1 TABLET(25 MG) BY MOUTH DAILY Strength: 25 mg 90 tablet 3   meloxicam (MOBIC) 15 MG tablet TAKE 1 TABLET(15 MG) BY MOUTH DAILY 90 tablet 1   metoprolol tartrate (LOPRESSOR) 25 MG tablet TAKE 1 TABLET(25 MG) BY MOUTH TWICE DAILY 180 tablet 0   nystatin (MYCOSTATIN/NYSTOP) powder Apply 1 application topically 2 (two) times daily. (Patient taking differently: Apply 1 application  topically 2 (two) times daily as needed (rash).) 15 g 3   pantoprazole (PROTONIX) 40 MG tablet TAKE 1 TABLET BY MOUTH TWICE A DAY 60 tablet 11   pregabalin (LYRICA) 150 MG capsule Take 1 capsule (150 mg total) by mouth 2 (two) times daily. 180 capsule 3   Semaglutide, 1 MG/DOSE, 4 MG/3ML SOPN Inject 1 mg as  directed once a week. 3 mL 3   isosorbide mononitrate (IMDUR) 30 MG 24 hr tablet TAKE 1 TABLET(30 MG) BY MOUTH DAILY (Patient not taking: Reported on  12/14/2021) 15 tablet 0   amoxicillin-clavulanate (AUGMENTIN) 875-125 MG tablet Take 1 tablet by mouth every 12 (twelve) hours. 14 tablet 0   benzonatate (TESSALON) 100 MG capsule Take 1 capsule (100 mg total) by mouth every 8 (eight) hours. 21 capsule 0   dextromethorphan-guaiFENesin (MUCINEX DM) 30-600 MG 12hr tablet Take 1 tablet by mouth 2 (two) times daily as needed for cough. (Patient not taking: Reported on 12/14/2021) 10 tablet 0   guaiFENesin (MUCINEX) 600 MG 12 hr tablet Take 2 tablets (1,200 mg total) by mouth 2 (two) times daily. 30 tablet 0   No facility-administered medications prior to visit.   Allergies  Allergen Reactions   Codeine Itching and Nausea Only   Metformin And Related Diarrhea and Other (See Comments)    Stomach pain/nausea   Wellbutrin [Bupropion Hcl] Hives   Acyclovir And Related Rash     Objective:   Today's Vitals   12/14/21 1103  BP: 120/70  Pulse: 92  Temp: (!) 97.4 F (36.3 C)  TempSrc: Temporal  SpO2: 95%  Weight: 290 lb (131.5 kg)  Height: 5' 4" (1.626 m)   Body mass index is 49.78 kg/m.   General: Well developed, well nourished. No acute distress. Lungs: Coarse respirations with some mucousy breath sounds and mild end-expiratory wheezing. Extremities: Left knee is tender along the medial joitn line. No increased warmth. No significant   crepitance. Psych: Alert and oriented. Normal mood and affect.  Health Maintenance Due  Topic Date Due   Hepatitis C Screening  Never done   Zoster Vaccines- Shingrix (1 of 2) Never done   OPHTHALMOLOGY EXAM  05/25/2021   INFLUENZA VACCINE  11/22/2021     Assessment & Plan:   1. Type 2 diabetes mellitus with polyneuropathy (HCC) We will check annual DM labs today. Although it sounds like her blood sugars are improved, I am not seeing any weight loss associated with her semaglutide use. I will try stepping her dose upt o 2 mg weekly. She is up to date on other screenings at this point.  -  Semaglutide, 2 MG/DOSE, 8 MG/3ML SOPN; Inject 2 mg as directed once a week.  Dispense: 3 mL; Refill: 6 - Hemoglobin A1c - Lipid panel - Microalbumin / creatinine urine ratio - Basic metabolic panel - Urinalysis, Routine w reflex microscopic  2. Essential hypertension Blood pressure is at goal. Continue metoprolol 25 mg bid and losartan 25 mg daily.  3. COPD with chronic bronchitis ([place]) I am concerned about the management of Ms. Souffrant's COPD. I strongly urged her to follow up with Dr. Melvyn Novas. She has had recent exacerbations. The repeated cycles of oral steroids are not helping with her diabetes control and weight loss. She is not using her inhalers consistently. Her weight also plays a role in some restrictive lung disease as well.  4. Morbid obesity with BMI of 45.0-49.9, adult ([place]) We once again discussed her weight issues. I am hopeful that a step up in her semaglutide dose might help with this. However, I reiterated my recommendation to consider bariatric/metabolic surgery.  5. Mixed hyperlipidemia We will repeat lipids today. Continue atorvastatin 40 mg dialy.  6. Arthritis of left knee Agree with approach to pool exercises to off load her knee. Ultimately, weight loss would benefit reducing further degenerative joint issues with this.  Return in about 3 months (around 03/16/2022) for Reassessment.   Haydee Salter, MD

## 2022-01-13 ENCOUNTER — Telehealth: Payer: Self-pay

## 2022-01-13 NOTE — Patient Outreach (Signed)
  Care Coordination   01/13/2022 Name: [name] MRN: [id number] DOB: 1966-03-16   Care Coordination Outreach Attempts:  An unsuccessful telephone outreach was attempted today to offer the patient information about available care coordination services as a benefit of their health plan.   Follow Up Plan:  Additional outreach attempts will be made to offer the patient care coordination information and services.   Encounter Outcome:  No Answer  Care Coordination Interventions Activated:  No   Care Coordination Interventions:  No, not indicated    Jone Baseman, RN, MSN [facility] Care Management Care Management Coordinator Direct Line [phone]

## 2022-01-25 ENCOUNTER — Telehealth: Payer: Self-pay

## 2022-01-25 NOTE — Patient Outreach (Signed)
  Care Coordination   Initial Visit Note   01/25/2022 Name: [name] MRN: [id number] DOB: [birth year]  [name] is a 56 y.o. year old female who sees Rudd, Lillette Boxer, MD for primary care. I spoke with  [name] by phone today.  What matters to the patients health and wellness today?  none    Goals Addressed             This Visit's Progress    COMPLETED: Care Coordination Activities-No follow up required       Care Coordination Interventions: Advised patient to call doctor office to schedule annual wellness visit.          SDOH assessments and interventions completed:  Yes     Care Coordination Interventions Activated:  Yes  Care Coordination Interventions:  Yes, provided    Follow up plan: No further intervention required.   Encounter Outcome:  Pt. Visit Completed   Jone Baseman, RN, MSN [place] [facility] Care Management Coordinator Direct Line [phone]

## 2022-01-25 NOTE — Patient Instructions (Signed)
Visit Information  Thank you for taking time to visit with me today. Please don't hesitate to contact me if I can be of assistance to you.   Following are the goals we discussed today:   Goals Addressed             This Visit's Progress    COMPLETED: Care Coordination Activities-No follow up required       Care Coordination Interventions: Advised patient to call doctor office to schedule annual wellness visit.            If you are experiencing a Mental Health or [place] or need someone to talk to, please call the Suicide and Crisis Lifeline: 988   Patient verbalizes understanding of instructions and care plan provided today and agrees to view in [place]. Active MyChart status and patient understanding of how to access instructions and care plan via MyChart confirmed with patient.     No further follow up required: patient declined  Jone Baseman, RN, MSN [place] Management Care Management Coordinator Direct Line [phone]

## 2022-01-26 ENCOUNTER — Other Ambulatory Visit: Payer: Self-pay | Admitting: Family Medicine

## 2022-01-26 DIAGNOSIS — J309 Allergic rhinitis, unspecified: Secondary | ICD-10-CM

## 2022-02-21 ENCOUNTER — Telehealth: Payer: Self-pay | Admitting: Family Medicine

## 2022-02-21 NOTE — Telephone Encounter (Signed)
Left message for patient to call back and schedule Medicare Annual Wellness Visit (AWV).   Please offer to do virtually or by telephone.  Left office number and my jabber [phone].  Last AWV:10/30/2019  Please schedule at anytime with Nurse Health Advisor.

## 2022-03-03 ENCOUNTER — Telehealth (INDEPENDENT_AMBULATORY_CARE_PROVIDER_SITE_OTHER): Payer: Medicare HMO | Admitting: Psychiatry

## 2022-03-03 ENCOUNTER — Encounter (HOSPITAL_COMMUNITY): Payer: Self-pay | Admitting: Psychiatry

## 2022-03-03 DIAGNOSIS — F331 Major depressive disorder, recurrent, moderate: Secondary | ICD-10-CM | POA: Diagnosis not present

## 2022-03-03 DIAGNOSIS — F901 Attention-deficit hyperactivity disorder, predominantly hyperactive type: Secondary | ICD-10-CM

## 2022-03-03 DIAGNOSIS — R69 Illness, unspecified: Secondary | ICD-10-CM | POA: Diagnosis not present

## 2022-03-03 MED ORDER — AMPHETAMINE-DEXTROAMPHETAMINE 30 MG PO TABS: 30.0000 mg | ORAL_TABLET | Freq: Two times a day (BID) | ORAL | 0 refills | Status: DC

## 2022-03-03 MED ORDER — DULOXETINE HCL 60 MG PO CPEP: ORAL_CAPSULE | ORAL | 2 refills | Status: DC

## 2022-03-03 MED ORDER — CLONAZEPAM 0.5 MG PO TABS: 0.5000 mg | ORAL_TABLET | Freq: Two times a day (BID) | ORAL | 2 refills | Status: DC | PRN

## 2022-03-03 MED ORDER — ARIPIPRAZOLE 5 MG PO TABS: 5.0000 mg | ORAL_TABLET | Freq: Every day | ORAL | 1 refills | Status: DC

## 2022-03-03 NOTE — Progress Notes (Signed)
Virtual Visit via Telephone Note  I connected with [name] on 03/03/22 at 10:40 AM EST by telephone and verified that I am speaking with the correct person using two identifiers.  Location: Patient: home Provider: home office   I discussed the limitations, risks, security and privacy concerns of performing an evaluation and management service by telephone and the availability of in person appointments. I also discussed with the patient that there may be a patient responsible charge related to this service. The patient expressed understanding and agreed to proceed.      I discussed the assessment and treatment plan with the patient. The patient was provided an opportunity to ask questions and all were answered. The patient agreed with the plan and demonstrated an understanding of the instructions.   The patient was advised to call back or seek an in-person evaluation if the symptoms worsen or if the condition fails to improve as anticipated.  I provided 15 minutes of non-face-to-face time during this encounter.   Sheila Spiller, MD  [facility] MD/PA/NP OP Progress Note  03/03/2022 10:59 AM [name]  MRN:  [id number]  Chief Complaint:  Chief Complaint  Patient presents with   Anxiety   Depression   ADD   HPI:  This patient is a 56 year old married white female who lives with her husband in [place].  She is on disability.   The patient returns for follow-up regarding her depression anxiety and ADD after 3 months.  She states that she and her husband got bad news recently.  His throat cancer has recurred.  It is more extensive now and not operable.  He is getting immunotherapy and radiation.  Fortunately he is still able to work as an Clinical biochemist at least for now.  She is trying to stay positive but when she is alone she often has crying spells.  She thinks this is in line with the stress of the situation and does not think she is getting truly more depressed.   She is trying to stay busy and active with her grandchildren.  She is still sleeping well and focusing well.  She denies any thoughts of self-harm or suicidal ideation Visit Diagnosis:    ICD-10-CM   1. Moderate episode of recurrent major depressive disorder (HCC)  F33.1     2. Attention deficit hyperactivity disorder (ADHD), predominantly hyperactive type  F90.1       Past Psychiatric History: The patient was hospitalized twice in her 49s and again in 2006 and 2020 for drug overdose with suicidal intent   Past Medical History:  Past Medical History:  Diagnosis Date   ADHD    Alcohol abuse    Anemia    Anxiety    Asthmatic bronchitis    normal PFT/ seen by pulmonary no evidence of COPD   Atypical chest pain 10/05/2017   Back pain    Bilateral swelling of feet    Chest pain    Chest pain on respiration 03/25/2014   Chronic bronchitis ([place])    Chronic lower back pain    Chronic respiratory failure ([place]) 10/16/2011   Newly 02 dep 24/7 p discharge from [facility] 01/2013  - 03/13/2014  Walked RA  2 laps @ 185 ft each stopped due to  Sob/ aching in legs, thirsty/ no desat @ slow pace    Chronic respiratory failure with hypoxia (HCC)    On 2-3 L of oxygen at home   Cigarette smoker 12/02/2010   Followed in Pulmonary clinic/  [facility]/ [place]   - Limits of effective care reviewed 6/83/4196     Complication of anesthesia    Constipation    COPD (chronic obstructive pulmonary disease) ([place])    COPD with chronic bronchitis 10/01/2017   Daily headache    Depression    Diabetic peripheral neuropathy ([place])    DM (diabetes mellitus) type II controlled, neurological manifestation ([place]) 12/02/2010   Drug use    Gallbladder problem    Gastric erosions    EGD 08/2010.   GERD (gastroesophageal reflux disease)    Glaucoma    H/O drug abuse ([place]) 11/12/2017   -- scanned document from outside source: [facility] in [place] which showed UDS positive for  Adderall /amphetamine usage as well as positive urine for THC.  This test result was collected 08/11/2016 and reported 10/07/2016. - lso review of the chart shows another positive amphetamine, THC and METH in the urine back on 10/18/2015 under "care everywhere". ---So    Heavy menses    High cholesterol    History of blood transfusion    "related to low HgB" (10/05/2017)   History of hiatal hernia    HTN (hypertension)    Hyperlipidemia associated with type 2 diabetes mellitus ([place]) 10/18/2015   Hypertension associated with diabetes ([place]) 12/02/2010   D/c acei 12/22/2011 due to psuedowheeze and narcotic dependent cough> ? Improved - 22/29/7989 started bystolic in place of cozar due to cough     IBS (irritable bowel syndrome)    Increased urinary protein excretion    Internal hemorrhoids    Colonoscopy 5/12.   Iron deficiency anemia 10/01/2017   Joint pain    Mixed diabetic hyperlipidemia associated with type 2 diabetes mellitus ([employer]) 10/01/2017   On home oxygen therapy    "5L at night" (10/05/2017)   OSA on CPAP    Osteoarthritis    "back" (10/05/2017)   Oxygen dependent 10/16/2011   Pneumonia    "lots of times" (10/05/2017)   PONV (postoperative nausea and vomiting)    Poorly controlled diabetes mellitus ([place]) 11/12/2017   Pulmonary infiltrates 12/22/2011   Followed in Pulmonary clinic/ [facility]/ [place]    - See CT Chest 05/05/11    Shortness of breath    Tachycardia    never had test done since no insurance   Tobacco use disorder-current smoker greater than 40-pack-year history- since age 40 1 ppd 10/01/2017   Type II diabetes mellitus ([place])    Vitamin D deficiency     Past Surgical History:  Procedure Laterality Date   CESAREAN SECTION  1988; [place]   COLONOSCOPY  09/16/2010   QJJ:HERDEY colon/small internal hemorrhoids   ESOPHAGOGASTRODUODENOSCOPY  09/16/2010   SLF: normal/mild gastritis   ESOPHAGOGASTRODUODENOSCOPY N/A 10/19/2014   Procedure:  ESOPHAGOGASTRODUODENOSCOPY (EGD);  Surgeon: Danie Binder, MD;  Location: [facility] ENDO SUITE;  Service: Endoscopy;  Laterality: N/A;  [place]  01/17/2012   Procedure: HYSTEROSCOPY WITH THERMACHOICE;  Surgeon: Florian Buff, MD;  Location: [facility] ORS;  Service: Gynecology;  Laterality: N/A;  total therapy time: 9:13sec  D5W  18 ml in, D5W   40m out, temperture 87degrees celcious   KIDNEY SURGERY     as child for blockages   LEFT HEART CATH AND CORONARY ANGIOGRAPHY N/A 09/08/2019   Procedure: LEFT HEART CATH AND CORONARY ANGIOGRAPHY;  Surgeon: JMartinique Peter M, MD;  [facility];  Service: Cardiovascular;  Laterality: N/A;   TONSILLECTOMY     TUBAL LIGATION  1989   TYMPANOSTOMY TUBE PLACEMENT Bilateral    "several times when I was a child"   uterine ablation     WRIST FRACTURE SURGERY Left 1995    Family Psychiatric History: See below  Family History:  Family History  Problem Relation Age of Onset   Heart attack Mother 4       deceased   Diabetes Mother    Breast cancer Mother 64       inflammatory breast ca   Heart failure Mother        oxygen dependence, nonsmoker   Heart disease Mother    Depression Mother    Cancer Mother        Breast   Hypertension Mother    Hyperlipidemia Mother    Sudden death Mother    Sleep apnea Mother    Obesity Mother    Heart attack Father 52       deceased, etoh use   Heart disease Father    Alcohol abuse Father    Depression Father    Heart failure Father    Sudden death Father    Ulcers Sister    Hypertension Sister    Heart failure Sister    Depression Sister    Anxiety disorder Sister    Liver disease Maternal Aunt 52       died while on liver transplant list   Liver disease Maternal Uncle    Cancer Paternal Aunt        Breast   Heart disease Maternal Grandmother    Heart attack Maternal Grandmother        premature CAD   Colon cancer Neg Hx     Social History:   Social History   Socioeconomic History   Marital status: Married    Spouse name: Not on file   Number of children: 2   Years of education: Not on file   Highest education level: Not on file  Occupational History   Occupation: unemployed  Tobacco Use   Smoking status: Every Day    Packs/day: 1.50    Years: 40.00    Total pack years: 60.00    Types: Cigarettes, E-cigarettes    Start date: 1980   Smokeless tobacco: Current   Tobacco comments:    vape  Scientific laboratory technician Use: Every day  Substance and Sexual Activity   Alcohol use: Not Currently   Drug use: Yes    Types: Marijuana    Comment: occ   Sexual activity: Not Currently    Partners: Male    Birth control/protection: Surgical    Comment: BTL, younger than 3, more than 5  Other Topics Concern   Not on file  Social History Narrative   Has 2 step children.   Lives in multi-family home with children and grandchildren.   On disability.   Social Determinants of Health   Financial Resource Strain: Low Risk  (10/30/2019)   Overall Financial Resource Strain (CARDIA)    Difficulty of Paying Living Expenses: Not hard at all  Food Insecurity: No Food Insecurity (01/25/2022)   Hunger Vital Sign    Worried About Running Out of Food in the Last Year: Never true    Ran Out of Food in the Last Year: Never true  Transportation Needs: No Transportation Needs (10/30/2019)   PRAPARE - Hydrologist (  Medical): No    Lack of Transportation (Non-Medical): No  Physical Activity: Inactive (10/30/2019)   Exercise Vital Sign    Days of Exercise per Week: 0 days    Minutes of Exercise per Session: 0 min  Stress: Not on file  Social Connections: Moderately Isolated (10/30/2019)   Social Connection and Isolation Panel [NHANES]    Frequency of Communication with Friends and Family: More than three times a week    Frequency of Social Gatherings with Friends and Family: More than three times a week    Attends  Religious Services: Never    Marine scientist or Organizations: No    Attends Archivist Meetings: Never    Marital Status: Married    Allergies:  Allergies  Allergen Reactions   Codeine Itching and Nausea Only   Metformin And Related Diarrhea and Other (See Comments)    Stomach pain/nausea   Wellbutrin [Bupropion Hcl] Hives   Acyclovir And Related Rash    Metabolic Disorder Labs: Lab Results  Component Value Date   HGBA1C 7.9 (H) 12/14/2021   MPG 177.16 06/02/2021   MPG 188.64 03/05/2019   No results found for: "PROLACTIN" Lab Results  Component Value Date   CHOL 119 12/14/2021   TRIG 86.0 12/14/2021   HDL 44.10 12/14/2021   CHOLHDL 3 12/14/2021   VLDL 17.2 12/14/2021   LDLCALC 57 12/14/2021   LDLCALC 63 03/14/2021   Lab Results  Component Value Date   TSH 1.860 06/30/2019   TSH 2.40 11/26/2018    Therapeutic Level Labs: No results found for: "LITHIUM" No results found for: "VALPROATE" No results found for: "CBMZ"  Current Medications: Current Outpatient Medications  Medication Sig Dispense Refill   ACCU-CHEK GUIDE test strip USE UP TO FOUR TIMES DAILY AS DIRECTED 200 strip 11   Accu-Chek Softclix Lancets lancets USE UP TO FOUR TIMES DAILY AS DIRECTED 300 each 0   albuterol (PROVENTIL) (2.5 MG/3ML) 0.083% nebulizer solution Take 3 mLs (2.5 mg total) by nebulization every 6 (six) hours as needed for wheezing or shortness of breath. 75 mL 0   albuterol (VENTOLIN HFA) 108 (90 Base) MCG/ACT inhaler Inhale 2 puffs into the lungs every 6 (six) hours as needed for wheezing or shortness of breath.     amphetamine-dextroamphetamine (ADDERALL) 30 MG tablet Take 1 tablet by mouth 2 (two) times daily. 60 tablet 0   amphetamine-dextroamphetamine (ADDERALL) 30 MG tablet Take 1 tablet by mouth 2 (two) times daily. 60 tablet 0   amphetamine-dextroamphetamine (ADDERALL) 30 MG tablet Take 1 tablet by mouth 2 (two) times daily. 60 tablet 0   ARIPiprazole (ABILIFY)  5 MG tablet Take 1 tablet (5 mg total) by mouth daily. 90 tablet 1   atorvastatin (LIPITOR) 40 MG tablet Take 0.5 tablets (20 mg total) by mouth 2 (two) times daily. 90 tablet 3   blood glucose meter kit and supplies Dispense based on patient and insurance preference. Use up to four times daily as directed. (FOR ICD-10 E10.9, E11.9). 1 each 0   clonazePAM (KLONOPIN) 0.5 MG tablet Take 1 tablet (0.5 mg total) by mouth 2 (two) times daily as needed for anxiety. 60 tablet 2   DULoxetine (CYMBALTA) 60 MG capsule TAKE 1 CAPSULE BY MOUTH EVERY DAY 30 capsule 2   FARXIGA 10 MG TABS tablet TAKE 1 TABLET(10 MG) BY MOUTH DAILY 90 tablet 3   fluticasone (FLONASE) 50 MCG/ACT nasal spray PLACE 1 SPRAY INTO BOTH NOSTRILS 2 (TWO) TIMES DAILY AS NEEDED FOR  ALLERGIES OR RHINITIS (AFTER SINUS RINSE). 48 mL 2   furosemide (LASIX) 40 MG tablet AS NEEDED for weight increase of 3 lbs overnight or 5 lbs in 1 week (Patient taking differently: Take 40 mg by mouth daily as needed for edema. AS NEEDED for weight increase of 3 lbs overnight or 5 lbs in 1 week) 90 tablet 3   glipiZIDE (GLUCOTROL) 5 MG tablet TAKE 1 TABLET(5 MG) BY MOUTH TWICE DAILY BEFORE A MEAL 60 tablet 3   ibuprofen (ADVIL) 200 MG tablet Take 200 mg by mouth every 6 (six) hours as needed for mild pain.     insulin glargine (LANTUS) 100 UNIT/ML injection INJECT 0.15 MLS (15 UNITS TOTAL) INTO THE SKIN DAILY. 10 mL 11   Insulin Pen Needle (BD PEN NEEDLE NANO 2ND GEN) 32G X 4 MM MISC USE DAILY WITH VICTOZA 100 each 11   Insulin Syringe-Needle U-100 (INSULIN SYRINGES) 31G X 5/16" 0.5 ML MISC 1 each by Does not apply route daily. 100 each 2   ipratropium-albuterol (DUONEB) 0.5-2.5 (3) MG/3ML SOLN Take 3 mLs by nebulization 3 (three) times daily. 360 mL 3   isosorbide mononitrate (IMDUR) 30 MG 24 hr tablet TAKE 1 TABLET(30 MG) BY MOUTH DAILY (Patient not taking: Reported on 12/14/2021) 15 tablet 0   losartan (COZAAR) 25 MG tablet Take 1 tablet (25 mg total) by mouth  daily. TAKE 1 TABLET(25 MG) BY MOUTH DAILY Strength: 25 mg 90 tablet 3   meloxicam (MOBIC) 15 MG tablet TAKE 1 TABLET(15 MG) BY MOUTH DAILY 90 tablet 1   metoprolol tartrate (LOPRESSOR) 25 MG tablet TAKE 1 TABLET(25 MG) BY MOUTH TWICE DAILY 180 tablet 0   nystatin (MYCOSTATIN/NYSTOP) powder Apply 1 application topically 2 (two) times daily. (Patient taking differently: Apply 1 application  topically 2 (two) times daily as needed (rash).) 15 g 3   pantoprazole (PROTONIX) 40 MG tablet TAKE 1 TABLET BY MOUTH TWICE A DAY 60 tablet 11   pregabalin (LYRICA) 150 MG capsule Take 1 capsule (150 mg total) by mouth 2 (two) times daily. 180 capsule 3   Semaglutide, 1 MG/DOSE, 4 MG/3ML SOPN Inject 1 mg as directed once a week. 3 mL 3   Semaglutide, 2 MG/DOSE, 8 MG/3ML SOPN Inject 2 mg as directed once a week. 3 mL 6   No current facility-administered medications for this visit.     Musculoskeletal: Strength & Muscle Tone: na Gait & Station: na Patient leans: N/A  Psychiatric Specialty Exam: Review of Systems  Psychiatric/Behavioral:  Positive for dysphoric mood.   All other systems reviewed and are negative.   Last menstrual period 07/24/2014.There is no height or weight on file to calculate BMI.  General Appearance: NA  Eye Contact:  NA  Speech:  Clear and Coherent  Volume:  Normal  Mood:  Dysphoric  Affect:  NA  Thought Process:  Goal Directed  Orientation:  Full (Time, Place, and Person)  Thought Content: Rumination   Suicidal Thoughts:  No  Homicidal Thoughts:  No  Memory:  Immediate;   Good Recent;   Good Remote;   Good  Judgement:  Good  Insight:  Fair  Psychomotor Activity:  Normal  Concentration:  Concentration: Good and Attention Span: Good  Recall:  Good  Fund of Knowledge: Good  Language: Good  Akathisia:  No  Handed:  Right  AIMS (if indicated): not done  Assets:  Communication Skills Desire for Improvement Resilience Social Support Talents/Skills  ADL's:  Intact   Cognition: WNL  Sleep:  Good   Screenings: AIMS    Flowsheet Row Admission (Discharged) from 03/07/2019 in [facility] Admission (Discharged) from 09/12/2014 in [facility]  AIMS Total Score 0 0      AUDIT    Flowsheet Row Admission (Discharged) from 03/07/2019 in [facility] Admission (Discharged) from 09/12/2014 in [facility]  Alcohol Use Disorder Identification Test Final Score (AUDIT) 0 0      GAD-7    Flowsheet Row Office Visit from 03/14/2021 in [facility] Visit from 10/20/2020 in [facility] Visit from 11/06/2019 in [facility]  Total GAD-7 Score _0 PHQ2-9    Flowsheet Row Video Visit from 03/03/2022 in [facility] Telephone from 01/25/2022 in [facility] Visit from 12/14/2021 in [facility] Video Visit from 12/01/2021 in [facility] Video Visit from 09/02/2021 in [facility]  PHQ-2 Total Score 1 1 0 0 0  PHQ-9 Total Score -- -- 5 -- --      Flowsheet Row Video Visit from 03/03/2022 in [facility] Video Visit from 12/01/2021 in [facility] from 11/20/2021 in [facility] No Risk No Risk Error: Question 6 not populated        Assessment and Plan: This patient is a 56 year old female with a history of depression anxiety and ADD.  She is more just [place] since her husband's diagnosis but she seems to be holding her own.  She will continue Cymbalta 60 mg daily for depression, Abilify 5 mg daily for augmentation clonazepam 0.5 mg twice daily for  anxiety and Adderall 30 mg twice daily for ADD.  She will return to see me in 3 months  Collaboration of Care: Collaboration of Care: Primary Care Provider AEB notes are shared with PCP on the epic system  Patient/Guardian was advised Release of Information must be obtained prior to any record release in order to collaborate their care with an outside provider. Patient/Guardian was advised if they have not already done so to contact the registration department to sign all necessary forms in order for [place] to release information regarding their care.   Consent: Patient/Guardian gives verbal consent for treatment and assignment of benefits for services provided during this visit. Patient/Guardian expressed understanding and agreed to proceed.    Sheila Spiller, MD 03/03/2022, 10:59 AM

## 2022-03-20 ENCOUNTER — Ambulatory Visit (INDEPENDENT_AMBULATORY_CARE_PROVIDER_SITE_OTHER): Payer: Medicare HMO | Admitting: Family Medicine

## 2022-03-20 ENCOUNTER — Encounter: Payer: Self-pay | Admitting: Family Medicine

## 2022-03-20 VITALS — BP 118/70 | HR 79 | Temp 97.0°F | Ht 64.0 in | Wt 284.8 lb

## 2022-03-20 DIAGNOSIS — Z6841 Body Mass Index (BMI) 40.0 and over, adult: Secondary | ICD-10-CM

## 2022-03-20 DIAGNOSIS — F332 Major depressive disorder, recurrent severe without psychotic features: Secondary | ICD-10-CM | POA: Diagnosis not present

## 2022-03-20 DIAGNOSIS — Z23 Encounter for immunization: Secondary | ICD-10-CM

## 2022-03-20 DIAGNOSIS — E1142 Type 2 diabetes mellitus with diabetic polyneuropathy: Secondary | ICD-10-CM

## 2022-03-20 DIAGNOSIS — M1712 Unilateral primary osteoarthritis, left knee: Secondary | ICD-10-CM | POA: Diagnosis not present

## 2022-03-20 DIAGNOSIS — Z1159 Encounter for screening for other viral diseases: Secondary | ICD-10-CM

## 2022-03-20 DIAGNOSIS — J4489 Other specified chronic obstructive pulmonary disease: Secondary | ICD-10-CM

## 2022-03-20 DIAGNOSIS — R69 Illness, unspecified: Secondary | ICD-10-CM | POA: Diagnosis not present

## 2022-03-20 DIAGNOSIS — I10 Essential (primary) hypertension: Secondary | ICD-10-CM | POA: Diagnosis not present

## 2022-03-20 DIAGNOSIS — F17200 Nicotine dependence, unspecified, uncomplicated: Secondary | ICD-10-CM

## 2022-03-20 LAB — HEMOGLOBIN A1C: Hgb A1c MFr Bld: 7.1 % — ABNORMAL HIGH (ref 4.6–6.5)

## 2022-03-20 LAB — GLUCOSE, RANDOM: Glucose, Bld: 133 mg/dL — ABNORMAL HIGH (ref 70–99)

## 2022-03-20 MED ORDER — MELOXICAM 15 MG PO TABS: 15.0000 mg | ORAL_TABLET | Freq: Every day | ORAL | 3 refills | Status: DC

## 2022-03-20 NOTE — Progress Notes (Signed)
[facility] [address] [place] [address] Dept: [phone] Dept Fax: [phone]  Chronic Care Office Visit  Subjective:    Patient ID: [name], female    DOB: 1966/03/08, 56 y.o..   MRN: [id number]  Chief Complaint  Patient presents with   Follow-up    3 month f/u.  No concerns.       History of Present Illness:  Patient is in today for reassessment of chronic medical issues.  [name] has a history of COPD with chronic bronchitis. She has been prescribed albuterol (inhaler and nebulizer) and Duoneb, but she notes she is not really using these. She continues to smoke cigarettes and has failed multiple trials of medications to help her stop.   [name] has a history of depression/anxiety and ADHD. She continue to see Dr. Harrington Challenger (psychiatry) regarding her behavioral health issues. She notes recent stressors related to the death of a cousin and her husband having cancer recurrence (laryngeal). She feels she is actually managing with this okay at this point.    [name] has a history of hyperlipidemia and is managed on atorvastatin 40 mg daily.   [name] has a history of Type 2 diabetes. She is managed on dapagliflozin Wilder Glade) 10 mg daily, glipizide 5 mg bid, insulin glargine 15 units daily and semaglutide (Ozempic) 2 mg weekly (increased after her last visit). Her diabetes is complicated by peripheral neuropathy. This is managed with Lyrica 150 mg bid.    [name] has a history of hypertension. She is managed on metoprolol 25 mg bid and losartan 25 mg daily.  Past Medical History: Patient Active Problem List   Diagnosis Date Noted   Arthritis of left knee 12/14/2021   Acute respiratory failure with hypoxia (HCC) 06/02/2021   Marijuana use 03/14/2021   Allergic rhinitis 03/14/2021   Angina pectoris ([place]) 09/08/2019   Benzodiazepine overdose 03/05/2019   Generalized muscle ache 09/24/2018    Chronic right ear pain 03/11/2018   Chronic sinusitis 03/11/2018   H/O drug abuse ([place]) 11/12/2017   Positive for macroalbuminuria 11/12/2017   Atypical chest pain 10/05/2017   COPD with chronic bronchitis 10/01/2017   Mixed hyperlipidemia 10/01/2017   Tobacco use disorder 10/01/2017   Diabetic peripheral neuropathy ([place]) 10/01/2017   Iron deficiency anemia 10/01/2017   Adult attention deficit hyperactivity disorder 01/14/2016   Major depressive disorder, recurrent severe without psychotic features ([place])    Leukocytosis 03/25/2014   OSA on CPAP 04/11/2013   Perforated ear drum 05/22/2012   Hypokalemia 05/08/2012   Bell's palsy 01/23/2012   Carpal tunnel syndrome on both sides 01/11/2012   Oxygen dependent 10/16/2011   Vitamin D deficiency 07/06/2011   Asthmatic bronchitis 06/11/2011   Back pain 06/11/2011   Morbid obesity with BMI of 45.0-49.9, adult ([place]) 05/06/2011   Gastric erosions 12/05/2010   Essential hypertension 12/02/2010   Type 2 diabetes mellitus with polyneuropathy ([place]) 12/02/2010   COPD with acute exacerbation ([place]) 11/30/2010   GERD (gastroesophageal reflux disease) 09/08/2010   Esophageal dysphagia 09/08/2010   Past Surgical History:  Procedure Laterality Date   CESAREAN SECTION  1988; [place]   COLONOSCOPY  09/16/2010   XTK:WIOXBD colon/small internal hemorrhoids   ESOPHAGOGASTRODUODENOSCOPY  09/16/2010   SLF: normal/mild gastritis   ESOPHAGOGASTRODUODENOSCOPY N/A 10/19/2014   Procedure: ESOPHAGOGASTRODUODENOSCOPY (EGD);  Surgeon: Danie Binder, MD;  Location: [facility];  Service: Endoscopy;  Laterality: N/A;  830  FRACTURE SURGERY     HYSTEROSCOPY WITH THERMACHOICE  01/17/2012   Procedure: HYSTEROSCOPY WITH THERMACHOICE;  Surgeon: Florian Buff, MD;  Location: [facility];  Service: Gynecology;  Laterality: N/A;  total therapy time: 9:13sec  D5W  18 ml in, D5W   29m out, temperture 87degrees celcious   KIDNEY SURGERY     as child  for blockages   LEFT HEART CATH AND CORONARY ANGIOGRAPHY N/A 09/08/2019   Procedure: LEFT HEART CATH AND CORONARY ANGIOGRAPHY;  Surgeon: JMartinique Peter M, MD;  [facility];  Service: Cardiovascular;  Laterality: N/A;   TONSILLECTOMY     TUBAL LIGATION  1989   TYMPANOSTOMY TUBE PLACEMENT Bilateral    "several times when I was a child"   uterine ablation     WRIST FRACTURE SURGERY Left 1995   Family History  Problem Relation Age of Onset   Heart attack Mother 582      deceased   Diabetes Mother    Breast cancer Mother 419      inflammatory breast ca   Heart failure Mother        oxygen dependence, nonsmoker   Heart disease Mother    Depression Mother    Cancer Mother        Breast   Hypertension Mother    Hyperlipidemia Mother    Sudden death Mother    Sleep apnea Mother    Obesity Mother    Heart attack Father 434      deceased, etoh use   Heart disease Father    Alcohol abuse Father    Depression Father    Heart failure Father    Sudden death Father    Ulcers Sister    Hypertension Sister    Heart failure Sister    Depression Sister    Anxiety disorder Sister    Liver disease Maternal AAunt 19      died while on liver transplant list   Liver disease Maternal Uncle    Cancer Paternal Aunt        Breast   Heart disease Maternal Grandmother    Heart attack Maternal Grandmother        premature CAD   Colon cancer Neg Hx    Outpatient Medications Prior to Visit  Medication Sig Dispense Refill   ACCU-CHEK GUIDE test strip USE UP TO FOUR TIMES DAILY AS DIRECTED 200 strip 11   Accu-Chek Softclix Lancets lancets USE UP TO FOUR TIMES DAILY AS DIRECTED 300 each 0   albuterol (PROVENTIL) (2.5 MG/3ML) 0.083% nebulizer solution Take 3 mLs (2.5 mg total) by nebulization every 6 (six) hours as needed for wheezing or shortness of breath. 75 mL 0   albuterol (VENTOLIN HFA) 108 (90 Base) MCG/ACT inhaler Inhale 2 puffs into the lungs every 6 (six) hours as needed for  wheezing or shortness of breath.     amphetamine-dextroamphetamine (ADDERALL) 30 MG tablet Take 1 tablet by mouth 2 (two) times daily. 60 tablet 0   amphetamine-dextroamphetamine (ADDERALL) 30 MG tablet Take 1 tablet by mouth 2 (two) times daily. 60 tablet 0   amphetamine-dextroamphetamine (ADDERALL) 30 MG tablet Take 1 tablet by mouth 2 (two) times daily. 60 tablet 0   ARIPiprazole (ABILIFY) 5 MG tablet Take 1 tablet (5 mg total) by mouth daily. 90 tablet 1   atorvastatin (LIPITOR) 40 MG tablet Take 0.5 tablets (20 mg total) by mouth 2 (two) times daily. 90 tablet 3   blood glucose  meter kit and supplies Dispense based on patient and insurance preference. Use up to four times daily as directed. (FOR ICD-10 E10.9, E11.9). 1 each 0   clonazePAM (KLONOPIN) 0.5 MG tablet Take 1 tablet (0.5 mg total) by mouth 2 (two) times daily as needed for anxiety. 60 tablet 2   DULoxetine (CYMBALTA) 60 MG capsule TAKE 1 CAPSULE BY MOUTH EVERY DAY 30 capsule 2   FARXIGA 10 MG TABS tablet TAKE 1 TABLET(10 MG) BY MOUTH DAILY 90 tablet 3   fluticasone (FLONASE) 50 MCG/ACT nasal spray PLACE 1 SPRAY INTO BOTH NOSTRILS 2 (TWO) TIMES DAILY AS NEEDED FOR ALLERGIES OR RHINITIS (AFTER SINUS RINSE). 48 mL 2   furosemide (LASIX) 40 MG tablet AS NEEDED for weight increase of 3 lbs overnight or 5 lbs in 1 week (Patient taking differently: Take 40 mg by mouth daily as needed for edema. AS NEEDED for weight increase of 3 lbs overnight or 5 lbs in 1 week) 90 tablet 3   glipiZIDE (GLUCOTROL) 5 MG tablet TAKE 1 TABLET(5 MG) BY MOUTH TWICE DAILY BEFORE A MEAL 60 tablet 3   ibuprofen (ADVIL) 200 MG tablet Take 200 mg by mouth every 6 (six) hours as needed for mild pain.     insulin glargine (LANTUS) 100 UNIT/ML injection INJECT 0.15 MLS (15 UNITS TOTAL) INTO THE SKIN DAILY. 10 mL 11   Insulin Pen Needle (BD PEN NEEDLE NANO 2ND GEN) 32G X 4 MM MISC USE DAILY WITH VICTOZA 100 each 11   Insulin Syringe-Needle U-100 (INSULIN SYRINGES) 31G X  5/16" 0.5 ML MISC 1 each by Does not apply route daily. 100 each 2   ipratropium-albuterol (DUONEB) 0.5-2.5 (3) MG/3ML SOLN Take 3 mLs by nebulization 3 (three) times daily. 360 mL 3   isosorbide mononitrate (IMDUR) 30 MG 24 hr tablet TAKE 1 TABLET(30 MG) BY MOUTH DAILY 15 tablet 0   losartan (COZAAR) 25 MG tablet Take 1 tablet (25 mg total) by mouth daily. TAKE 1 TABLET(25 MG) BY MOUTH DAILY Strength: 25 mg 90 tablet 3   metoprolol tartrate (LOPRESSOR) 25 MG tablet TAKE 1 TABLET(25 MG) BY MOUTH TWICE DAILY 180 tablet 0   nystatin (MYCOSTATIN/NYSTOP) powder Apply 1 application topically 2 (two) times daily. (Patient taking differently: Apply 1 application  topically 2 (two) times daily as needed (rash).) 15 g 3   pantoprazole (PROTONIX) 40 MG tablet TAKE 1 TABLET BY MOUTH TWICE A DAY 60 tablet 11   pregabalin (LYRICA) 150 MG capsule Take 1 capsule (150 mg total) by mouth 2 (two) times daily. 180 capsule 3   Semaglutide, 2 MG/DOSE, 8 MG/3ML SOPN Inject 2 mg as directed once a week. 3 mL 6   meloxicam (MOBIC) 15 MG tablet TAKE 1 TABLET(15 MG) BY MOUTH DAILY 90 tablet 1   Semaglutide, 1 MG/DOSE, 4 MG/3ML SOPN Inject 1 mg as directed once a week. 3 mL 3   No facility-administered medications prior to visit.   Allergies  Allergen Reactions   Codeine Itching and Nausea Only   Metformin And Related Diarrhea and Other (See Comments)    Stomach pain/nausea   Wellbutrin [Bupropion Hcl] Hives   Acyclovir And Related Rash     Objective:   Today's Vitals   03/20/22 1026  BP: 118/70  Pulse: 79  Temp: (!) 97 F (36.1 C)  TempSrc: Temporal  SpO2: 99%  Weight: 284 lb 12.8 oz (129.2 kg)  Height: _0  (1.626 m)   Body mass index is 48.89 kg/m.  General: Well developed, well nourished. No acute distress. Lungs: Coarse lower airway sounds. No wheezing, rales or rhonchi. Psych: Alert and oriented. Normal mood and affect.  Health Maintenance Due  Topic Date Due   Hepatitis C Screening  Never  done   Zoster Vaccines- Shingrix (1 of 2) Never done   Lung Cancer Screening  12/05/2018   Medicare Annual Wellness (AWV)  10/29/2020   OPHTHALMOLOGY EXAM  05/25/2021   FOOT EXAM  03/14/2022        03/20/2022   11:36 AM 03/03/2022   10:49 AM 01/25/2022   12:09 PM  Depression screen PHQ 2/9  Decreased Interest 1  0  Down, Depressed, Hopeless 2  1  PHQ - 2 Score 3  1  Altered sleeping 1    Tired, decreased energy 2    Change in appetite 1    Feeling bad or failure about yourself  1    Trouble concentrating 1    Moving slowly or fidgety/restless 0    Suicidal thoughts 0    PHQ-9 Score 9    Difficult doing work/chores Somewhat difficult       Information is confidential and restricted. Go to Review Flowsheets to unlock data.      03/20/2022   11:36 AM 03/14/2021   11:14 AM 10/20/2020    4:56 PM 11/06/2019   10:55 AM  GAD 7 : Generalized Anxiety Score  Nervous, Anxious, on Edge _0 Control/stop worrying _1 Worry too much - different things _2 Trouble relaxing 0 _3 Restless 1 0 0 0  Easily annoyed or irritable _4 Afraid - awful might happen _5 0  Total GAD 7 Score _6 Anxiety Difficulty Somewhat difficult Somewhat difficult Somewhat difficult Somewhat difficult   Assessment & Plan:   1. Type 2 diabetes mellitus with polyneuropathy (HCC) Diabetes control has been improving. We will recheck her A1c today to see what impact the increase in her Ozempic has had. Continue dapagliflozin (Farxiga) 10 mg daily, glipizide 5 mg bid, insulin glargine 15 units daily and semaglutide (Ozempic) 2 mg weekly.  - Glucose, random - Hemoglobin A1c  2. Essential hypertension Blood pressure is at goal. Continue metoprolol tartrate 25 mg bid and losartan 25 mg daily.    3. COPD with chronic bronchitis Stable. I strongly encourage ongoing efforts at smoking cessation.   4. Tobacco use disorder As above. Patient has failed multiple medication approaches. We  did review use of NRT, which may be her best approach at this point.  I spent 3 minutes counseling the patient about tobacco cessation.  - CT CHEST LUNG CA SCREEN LOW DOSE W/O CM; Future  5. Morbid obesity with BMI of 45.0-49.9, adult (HCC) Maximum weight: 290 lbs (11/2021) Current weight: 284 lbs Weight change since last visit: -6 lbs Total weight loss: 6 lbs (2%)  We will continue use of semaglutide and monitor this.  6. Major depressive disorder, recurrent severe without psychotic features ([place]) Stable. Continue to follow with Dr. Harrington Challenger.  7. Arthritis of left knee Managing well on meloxicam.  - meloxicam (MOBIC) 15 MG tablet; Take 1 tablet (15 mg total) by mouth daily.  Dispense: 90 tablet; Refill: 3  8. Encounter for hepatitis C screening test for low risk patient  - HCV Ab w Reflex to Quant PCR  9. Need for influenza vaccination  - Flu Vaccine QUAD  6+ mos PF IM (Fluarix Quad PF)   Return in about 3 months (around 06/20/2022) for Reassessment.   Haydee Salter, MD

## 2022-03-21 LAB — HCV AB W REFLEX TO QUANT PCR: HCV Ab: NONREACTIVE

## 2022-03-21 LAB — HCV INTERPRETATION

## 2022-03-24 ENCOUNTER — Other Ambulatory Visit: Payer: Self-pay | Admitting: Family Medicine

## 2022-03-24 DIAGNOSIS — M1712 Unilateral primary osteoarthritis, left knee: Secondary | ICD-10-CM

## 2022-04-01 ENCOUNTER — Other Ambulatory Visit (HOSPITAL_COMMUNITY): Payer: Self-pay | Admitting: Psychiatry

## 2022-04-05 ENCOUNTER — Encounter: Payer: Self-pay | Admitting: Family Medicine

## 2022-04-25 ENCOUNTER — Inpatient Hospital Stay: Admission: RE | Admit: 2022-04-25 | Payer: Medicare HMO | Source: Ambulatory Visit

## 2022-05-22 ENCOUNTER — Ambulatory Visit
Admission: RE | Admit: 2022-05-22 | Discharge: 2022-05-22 | Disposition: A | Discharge status: Home / Self Care | Payer: Medicare HMO | Source: Ambulatory Visit | Attending: Family Medicine | Admitting: Family Medicine

## 2022-05-22 DIAGNOSIS — F17210 Nicotine dependence, cigarettes, uncomplicated: Secondary | ICD-10-CM | POA: Diagnosis not present

## 2022-05-22 DIAGNOSIS — F17200 Nicotine dependence, unspecified, uncomplicated: Secondary | ICD-10-CM

## 2022-05-22 DIAGNOSIS — R69 Illness, unspecified: Secondary | ICD-10-CM | POA: Diagnosis not present

## 2022-05-23 ENCOUNTER — Encounter: Payer: Self-pay | Admitting: Family Medicine

## 2022-05-23 ENCOUNTER — Other Ambulatory Visit: Payer: Self-pay | Admitting: Family Medicine

## 2022-05-23 DIAGNOSIS — I700 Atherosclerosis of aorta: Secondary | ICD-10-CM | POA: Insufficient documentation

## 2022-05-23 DIAGNOSIS — I2510 Atherosclerotic heart disease of native coronary artery without angina pectoris: Secondary | ICD-10-CM | POA: Insufficient documentation

## 2022-05-24 ENCOUNTER — Telehealth: Payer: Self-pay | Admitting: Family Medicine

## 2022-05-24 NOTE — Telephone Encounter (Signed)
Left message for patient to call back and schedule Medicare Annual Wellness Visit (AWV) either virtually or in office. Left  my Herbie Drape number [phone]   Last AWV 10/30/19 please schedule with Nurse Health Adviser   45 min for awv-i  in office appointments 30 min for awv-s & awv-i phone/virtual appointments

## 2022-05-25 ENCOUNTER — Other Ambulatory Visit: Payer: Self-pay

## 2022-05-25 DIAGNOSIS — E782 Mixed hyperlipidemia: Secondary | ICD-10-CM

## 2022-05-25 DIAGNOSIS — J441 Chronic obstructive pulmonary disease with (acute) exacerbation: Secondary | ICD-10-CM

## 2022-05-25 DIAGNOSIS — I10 Essential (primary) hypertension: Secondary | ICD-10-CM

## 2022-05-25 DIAGNOSIS — E1142 Type 2 diabetes mellitus with diabetic polyneuropathy: Secondary | ICD-10-CM

## 2022-05-26 NOTE — Addendum Note (Signed)
Addended by: Konrad Saha on: 05/26/2022 02:19 PM   Modules accepted: Orders

## 2022-05-29 ENCOUNTER — Other Ambulatory Visit: Payer: Self-pay | Admitting: Family Medicine

## 2022-05-29 ENCOUNTER — Telehealth (HOSPITAL_COMMUNITY): Payer: Self-pay | Admitting: *Deleted

## 2022-05-29 ENCOUNTER — Other Ambulatory Visit (HOSPITAL_COMMUNITY): Payer: Self-pay | Admitting: Psychiatry

## 2022-05-29 DIAGNOSIS — E1142 Type 2 diabetes mellitus with diabetic polyneuropathy: Secondary | ICD-10-CM

## 2022-05-29 MED ORDER — AMPHETAMINE-DEXTROAMPHETAMINE 30 MG PO TABS: 30.0000 mg | ORAL_TABLET | Freq: Two times a day (BID) | ORAL | 0 refills | Status: DC

## 2022-05-29 NOTE — Telephone Encounter (Signed)
Patient called stating she would like refills for her Adderall. Per pt she will be out of it on Feb 9th and her next appt is Feb 28th.

## 2022-05-29 NOTE — Telephone Encounter (Signed)
Patient aware.

## 2022-05-30 ENCOUNTER — Telehealth: Payer: Self-pay

## 2022-05-30 NOTE — Telephone Encounter (Signed)
PA for Pregablin 150 mg submitted through cover my meds and approved 04/24/22 - 04/24/23.  Pharmacy notified [place] phone. Dm/cma   Key: [id number]

## 2022-06-02 ENCOUNTER — Telehealth: Payer: Self-pay

## 2022-06-02 NOTE — Telephone Encounter (Signed)
Patient Advocate Encounter  Prior Authorization for Pregabalin caps has been approved.    Effective dates: 04/24/22 through 04/24/23  Placed a call to the pharmacy to notify of approval.  Approval letter attached to chart.

## 2022-06-09 ENCOUNTER — Emergency Department (HOSPITAL_COMMUNITY): Payer: Medicare HMO

## 2022-06-09 ENCOUNTER — Inpatient Hospital Stay (HOSPITAL_COMMUNITY)
Admission: EM | Admit: 2022-06-09 | Discharge: 2022-06-13 | DRG: 193 | Disposition: A | Discharge status: Home / Self Care | Payer: Medicare HMO | Attending: Internal Medicine | Admitting: Internal Medicine

## 2022-06-09 ENCOUNTER — Other Ambulatory Visit: Payer: Self-pay

## 2022-06-09 DIAGNOSIS — F909 Attention-deficit hyperactivity disorder, unspecified type: Secondary | ICD-10-CM | POA: Diagnosis present

## 2022-06-09 DIAGNOSIS — R197 Diarrhea, unspecified: Secondary | ICD-10-CM | POA: Diagnosis not present

## 2022-06-09 DIAGNOSIS — Z8249 Family history of ischemic heart disease and other diseases of the circulatory system: Secondary | ICD-10-CM

## 2022-06-09 DIAGNOSIS — G8929 Other chronic pain: Secondary | ICD-10-CM | POA: Diagnosis present

## 2022-06-09 DIAGNOSIS — E876 Hypokalemia: Secondary | ICD-10-CM | POA: Diagnosis not present

## 2022-06-09 DIAGNOSIS — J441 Chronic obstructive pulmonary disease with (acute) exacerbation: Secondary | ICD-10-CM | POA: Diagnosis not present

## 2022-06-09 DIAGNOSIS — E1142 Type 2 diabetes mellitus with diabetic polyneuropathy: Secondary | ICD-10-CM | POA: Diagnosis not present

## 2022-06-09 DIAGNOSIS — J9621 Acute and chronic respiratory failure with hypoxia: Secondary | ICD-10-CM | POA: Diagnosis not present

## 2022-06-09 DIAGNOSIS — I2510 Atherosclerotic heart disease of native coronary artery without angina pectoris: Secondary | ICD-10-CM | POA: Diagnosis present

## 2022-06-09 DIAGNOSIS — M549 Dorsalgia, unspecified: Secondary | ICD-10-CM | POA: Diagnosis present

## 2022-06-09 DIAGNOSIS — N179 Acute kidney failure, unspecified: Secondary | ICD-10-CM | POA: Diagnosis not present

## 2022-06-09 DIAGNOSIS — J9601 Acute respiratory failure with hypoxia: Secondary | ICD-10-CM | POA: Diagnosis not present

## 2022-06-09 DIAGNOSIS — Z881 Allergy status to other antibiotic agents status: Secondary | ICD-10-CM

## 2022-06-09 DIAGNOSIS — Z794 Long term (current) use of insulin: Secondary | ICD-10-CM | POA: Diagnosis not present

## 2022-06-09 DIAGNOSIS — F17200 Nicotine dependence, unspecified, uncomplicated: Secondary | ICD-10-CM | POA: Diagnosis present

## 2022-06-09 DIAGNOSIS — I700 Atherosclerosis of aorta: Secondary | ICD-10-CM | POA: Diagnosis present

## 2022-06-09 DIAGNOSIS — R519 Headache, unspecified: Secondary | ICD-10-CM | POA: Diagnosis not present

## 2022-06-09 DIAGNOSIS — Z83438 Family history of other disorder of lipoprotein metabolism and other lipidemia: Secondary | ICD-10-CM

## 2022-06-09 DIAGNOSIS — R059 Cough, unspecified: Secondary | ICD-10-CM | POA: Diagnosis not present

## 2022-06-09 DIAGNOSIS — Z885 Allergy status to narcotic agent status: Secondary | ICD-10-CM

## 2022-06-09 DIAGNOSIS — R0602 Shortness of breath: Secondary | ICD-10-CM | POA: Diagnosis not present

## 2022-06-09 DIAGNOSIS — Z803 Family history of malignant neoplasm of breast: Secondary | ICD-10-CM

## 2022-06-09 DIAGNOSIS — Z1152 Encounter for screening for COVID-19: Secondary | ICD-10-CM | POA: Diagnosis not present

## 2022-06-09 DIAGNOSIS — T380X5A Adverse effect of glucocorticoids and synthetic analogues, initial encounter: Secondary | ICD-10-CM | POA: Diagnosis not present

## 2022-06-09 DIAGNOSIS — R69 Illness, unspecified: Secondary | ICD-10-CM | POA: Diagnosis not present

## 2022-06-09 DIAGNOSIS — E1169 Type 2 diabetes mellitus with other specified complication: Secondary | ICD-10-CM | POA: Diagnosis present

## 2022-06-09 DIAGNOSIS — I10 Essential (primary) hypertension: Secondary | ICD-10-CM | POA: Diagnosis present

## 2022-06-09 DIAGNOSIS — I152 Hypertension secondary to endocrine disorders: Secondary | ICD-10-CM | POA: Diagnosis present

## 2022-06-09 DIAGNOSIS — Z791 Long term (current) use of non-steroidal anti-inflammatories (NSAID): Secondary | ICD-10-CM

## 2022-06-09 DIAGNOSIS — F17210 Nicotine dependence, cigarettes, uncomplicated: Secondary | ICD-10-CM | POA: Diagnosis not present

## 2022-06-09 DIAGNOSIS — Z9049 Acquired absence of other specified parts of digestive tract: Secondary | ICD-10-CM

## 2022-06-09 DIAGNOSIS — K219 Gastro-esophageal reflux disease without esophagitis: Secondary | ICD-10-CM | POA: Diagnosis present

## 2022-06-09 DIAGNOSIS — F332 Major depressive disorder, recurrent severe without psychotic features: Secondary | ICD-10-CM | POA: Diagnosis present

## 2022-06-09 DIAGNOSIS — J101 Influenza due to other identified influenza virus with other respiratory manifestations: Principal | ICD-10-CM | POA: Diagnosis present

## 2022-06-09 DIAGNOSIS — G4733 Obstructive sleep apnea (adult) (pediatric): Secondary | ICD-10-CM | POA: Diagnosis not present

## 2022-06-09 DIAGNOSIS — E782 Mixed hyperlipidemia: Secondary | ICD-10-CM | POA: Diagnosis not present

## 2022-06-09 DIAGNOSIS — Z818 Family history of other mental and behavioral disorders: Secondary | ICD-10-CM

## 2022-06-09 DIAGNOSIS — Z833 Family history of diabetes mellitus: Secondary | ICD-10-CM

## 2022-06-09 DIAGNOSIS — Z79899 Other long term (current) drug therapy: Secondary | ICD-10-CM

## 2022-06-09 DIAGNOSIS — Z9981 Dependence on supplemental oxygen: Secondary | ICD-10-CM | POA: Diagnosis not present

## 2022-06-09 DIAGNOSIS — M479 Spondylosis, unspecified: Secondary | ICD-10-CM | POA: Diagnosis present

## 2022-06-09 DIAGNOSIS — Z8711 Personal history of peptic ulcer disease: Secondary | ICD-10-CM

## 2022-06-09 DIAGNOSIS — Z6841 Body Mass Index (BMI) 40.0 and over, adult: Secondary | ICD-10-CM | POA: Diagnosis not present

## 2022-06-09 DIAGNOSIS — Z811 Family history of alcohol abuse and dependence: Secondary | ICD-10-CM

## 2022-06-09 DIAGNOSIS — Z888 Allergy status to other drugs, medicaments and biological substances status: Secondary | ICD-10-CM

## 2022-06-09 DIAGNOSIS — Z7984 Long term (current) use of oral hypoglycemic drugs: Secondary | ICD-10-CM

## 2022-06-09 LAB — CBC WITH DIFFERENTIAL/PLATELET
Abs Immature Granulocytes: 0.02 10*3/uL (ref 0.00–0.07)
Basophils Absolute: 0 10*3/uL (ref 0.0–0.1)
Basophils Relative: 0 %
Eosinophils Absolute: 0 10*3/uL (ref 0.0–0.5)
Eosinophils Relative: 0 %
HCT: 49.7 % — ABNORMAL HIGH (ref 36.0–46.0)
Hemoglobin: 15.4 g/dL — ABNORMAL HIGH (ref 12.0–15.0)
Immature Granulocytes: 0 %
Lymphocytes Relative: 18 %
Lymphs Abs: 1.3 10*3/uL (ref 0.7–4.0)
MCH: 24.3 pg — ABNORMAL LOW (ref 26.0–34.0)
MCHC: 31 g/dL (ref 30.0–36.0)
MCV: 78.3 fL — ABNORMAL LOW (ref 80.0–100.0)
Monocytes Absolute: 0.8 10*3/uL (ref 0.1–1.0)
Monocytes Relative: 12 %
Neutro Abs: 4.8 10*3/uL (ref 1.7–7.7)
Neutrophils Relative %: 70 %
Platelets: 211 10*3/uL (ref 150–400)
RBC: 6.35 MIL/uL — ABNORMAL HIGH (ref 3.87–5.11)
RDW: 19.4 % — ABNORMAL HIGH (ref 11.5–15.5)
WBC: 6.9 10*3/uL (ref 4.0–10.5)
nRBC: 0 % (ref 0.0–0.2)

## 2022-06-09 LAB — TROPONIN I (HIGH SENSITIVITY)
Troponin I (High Sensitivity): 57 ng/L — ABNORMAL HIGH (ref <18)
Troponin I (High Sensitivity): 53 ng/L — ABNORMAL HIGH (ref <18)

## 2022-06-09 LAB — COMPREHENSIVE METABOLIC PANEL
ALT: 29 U/L (ref 0–44)
AST: 46 U/L — ABNORMAL HIGH (ref 15–41)
Albumin: 3.9 g/dL (ref 3.5–5.0)
Alkaline Phosphatase: 59 U/L (ref 38–126)
Anion gap: 15 (ref 5–15)
BUN: 19 mg/dL (ref 6–20)
CO2: 24 mmol/L (ref 22–32)
Calcium: 8.9 mg/dL (ref 8.9–10.3)
Chloride: 95 mmol/L — ABNORMAL LOW (ref 98–111)
Creatinine, Ser: 1.71 mg/dL — ABNORMAL HIGH (ref 0.44–1.00)
GFR, Estimated: 35 mL/min — ABNORMAL LOW (ref >60)
Glucose, Bld: 143 mg/dL — ABNORMAL HIGH (ref 70–99)
Potassium: 3.2 mmol/L — ABNORMAL LOW (ref 3.5–5.1)
Sodium: 134 mmol/L — ABNORMAL LOW (ref 135–145)
Total Bilirubin: 0.6 mg/dL (ref 0.3–1.2)
Total Protein: 7.7 g/dL (ref 6.5–8.1)

## 2022-06-09 LAB — GLUCOSE, CAPILLARY
Glucose-Capillary: 183 mg/dL — ABNORMAL HIGH (ref 70–99)
Glucose-Capillary: 197 mg/dL — ABNORMAL HIGH (ref 70–99)
Glucose-Capillary: 241 mg/dL — ABNORMAL HIGH (ref 70–99)

## 2022-06-09 LAB — RESP PANEL BY RT-PCR (RSV, FLU A&B, COVID)  RVPGX2
Influenza A by PCR: POSITIVE — AB
Influenza B by PCR: NEGATIVE
Resp Syncytial Virus by PCR: NEGATIVE
SARS Coronavirus 2 by RT PCR: NEGATIVE

## 2022-06-09 LAB — BRAIN NATRIURETIC PEPTIDE: B Natriuretic Peptide: 23.5 pg/mL (ref 0.0–100.0)

## 2022-06-09 MED ORDER — TRAZODONE HCL 50 MG PO TABS: 25.0000 mg | ORAL_TABLET | Freq: Every evening | ORAL | Status: DC | PRN

## 2022-06-09 MED ORDER — INSULIN ASPART 100 UNIT/ML IJ SOLN
0.0000 [IU] | Freq: Three times a day (TID) | INTRAMUSCULAR | Status: DC
  Administered 2022-06-09 – 2022-06-10 (×2): 3 [IU] via SUBCUTANEOUS
  Administered 2022-06-10 (×2): 5 [IU] via SUBCUTANEOUS
  Administered 2022-06-11: 8 [IU] via SUBCUTANEOUS
  Administered 2022-06-11 (×2): 5 [IU] via SUBCUTANEOUS
  Administered 2022-06-12: 3 [IU] via SUBCUTANEOUS
  Administered 2022-06-12: 8 [IU] via SUBCUTANEOUS
  Administered 2022-06-12: 3 [IU] via SUBCUTANEOUS
  Filled 2022-06-09: qty 0.15

## 2022-06-09 MED ORDER — ACETAMINOPHEN 325 MG PO TABS
650.0000 mg | ORAL_TABLET | Freq: Four times a day (QID) | ORAL | Status: DC | PRN
  Administered 2022-06-09 – 2022-06-12 (×2): 650 mg via ORAL
  Filled 2022-06-09 (×2): qty 2

## 2022-06-09 MED ORDER — DOCUSATE SODIUM 100 MG PO CAPS
100.0000 mg | ORAL_CAPSULE | Freq: Two times a day (BID) | ORAL | Status: DC
  Administered 2022-06-09 – 2022-06-13 (×8): 100 mg via ORAL
  Filled 2022-06-09 (×8): qty 1

## 2022-06-09 MED ORDER — ARIPIPRAZOLE 5 MG PO TABS
5.0000 mg | ORAL_TABLET | Freq: Every day | ORAL | Status: DC
  Administered 2022-06-10 – 2022-06-13 (×4): 5 mg via ORAL
  Filled 2022-06-09 (×4): qty 1

## 2022-06-09 MED ORDER — INSULIN ASPART 100 UNIT/ML IJ SOLN
0.0000 [IU] | Freq: Every day | INTRAMUSCULAR | Status: DC
  Administered 2022-06-09: 2 [IU] via SUBCUTANEOUS
  Administered 2022-06-10: 4 [IU] via SUBCUTANEOUS
  Administered 2022-06-11: 3 [IU] via SUBCUTANEOUS
  Filled 2022-06-09: qty 0.05

## 2022-06-09 MED ORDER — ONDANSETRON HCL 4 MG PO TABS: 4.0000 mg | ORAL_TABLET | Freq: Four times a day (QID) | ORAL | Status: DC | PRN

## 2022-06-09 MED ORDER — PREGABALIN 50 MG PO CAPS
150.0000 mg | ORAL_CAPSULE | Freq: Two times a day (BID) | ORAL | Status: DC
  Administered 2022-06-09 – 2022-06-13 (×8): 150 mg via ORAL
  Filled 2022-06-09 (×8): qty 3

## 2022-06-09 MED ORDER — NICOTINE 14 MG/24HR TD PT24
14.0000 mg | MEDICATED_PATCH | Freq: Every day | TRANSDERMAL | Status: DC
  Administered 2022-06-09 – 2022-06-13 (×5): 14 mg via TRANSDERMAL
  Filled 2022-06-09 (×5): qty 1

## 2022-06-09 MED ORDER — FENTANYL CITRATE PF 50 MCG/ML IJ SOSY
50.0000 ug | PREFILLED_SYRINGE | Freq: Once | INTRAMUSCULAR | Status: AC
  Administered 2022-06-09: 50 ug via INTRAVENOUS
  Filled 2022-06-09: qty 1

## 2022-06-09 MED ORDER — ACETAMINOPHEN 500 MG PO TABS
1000.0000 mg | ORAL_TABLET | Freq: Once | ORAL | Status: AC
  Administered 2022-06-09: 1000 mg via ORAL
  Filled 2022-06-09: qty 2

## 2022-06-09 MED ORDER — DAPAGLIFLOZIN PROPANEDIOL 10 MG PO TABS: 10.0000 mg | ORAL_TABLET | Freq: Every day | ORAL | Status: DC

## 2022-06-09 MED ORDER — IPRATROPIUM-ALBUTEROL 0.5-2.5 (3) MG/3ML IN SOLN
3.0000 mL | Freq: Once | RESPIRATORY_TRACT | Status: AC
  Administered 2022-06-09: 3 mL via RESPIRATORY_TRACT
  Filled 2022-06-09: qty 3

## 2022-06-09 MED ORDER — METHYLPREDNISOLONE SODIUM SUCC 40 MG IJ SOLR
40.0000 mg | Freq: Two times a day (BID) | INTRAMUSCULAR | Status: DC
  Administered 2022-06-09: 40 mg via INTRAVENOUS
  Filled 2022-06-09: qty 1

## 2022-06-09 MED ORDER — ONDANSETRON HCL 4 MG/2ML IJ SOLN: 4.0000 mg | Freq: Four times a day (QID) | INTRAMUSCULAR | Status: DC | PRN

## 2022-06-09 MED ORDER — ALBUTEROL SULFATE (2.5 MG/3ML) 0.083% IN NEBU: 2.5000 mg | INHALATION_SOLUTION | RESPIRATORY_TRACT | Status: DC | PRN

## 2022-06-09 MED ORDER — GLIPIZIDE 5 MG PO TABS
5.0000 mg | ORAL_TABLET | Freq: Two times a day (BID) | ORAL | Status: DC
  Administered 2022-06-09 – 2022-06-13 (×8): 5 mg via ORAL
  Filled 2022-06-09 (×8): qty 1

## 2022-06-09 MED ORDER — ATORVASTATIN CALCIUM 10 MG PO TABS
20.0000 mg | ORAL_TABLET | Freq: Two times a day (BID) | ORAL | Status: DC
  Administered 2022-06-09 – 2022-06-13 (×8): 20 mg via ORAL
  Filled 2022-06-09 (×8): qty 2

## 2022-06-09 MED ORDER — ALBUTEROL SULFATE (2.5 MG/3ML) 0.083% IN NEBU
2.5000 mg | INHALATION_SOLUTION | Freq: Once | RESPIRATORY_TRACT | Status: AC
  Administered 2022-06-09: 2.5 mg via RESPIRATORY_TRACT
  Filled 2022-06-09: qty 3

## 2022-06-09 MED ORDER — POLYETHYLENE GLYCOL 3350 17 G PO PACK: 17.0000 g | PACK | Freq: Every day | ORAL | Status: DC | PRN

## 2022-06-09 MED ORDER — FLUTICASONE PROPIONATE 50 MCG/ACT NA SUSP: 1.0000 | Freq: Two times a day (BID) | NASAL | Status: DC | PRN

## 2022-06-09 MED ORDER — POTASSIUM CHLORIDE CRYS ER 20 MEQ PO TBCR: 40.0000 meq | EXTENDED_RELEASE_TABLET | Freq: Once | ORAL | Status: DC

## 2022-06-09 MED ORDER — OSELTAMIVIR PHOSPHATE 30 MG PO CAPS
30.0000 mg | ORAL_CAPSULE | Freq: Two times a day (BID) | ORAL | Status: DC
  Administered 2022-06-09 – 2022-06-10 (×2): 30 mg via ORAL
  Filled 2022-06-09 (×2): qty 1

## 2022-06-09 MED ORDER — POTASSIUM CHLORIDE CRYS ER 20 MEQ PO TBCR
40.0000 meq | EXTENDED_RELEASE_TABLET | Freq: Once | ORAL | Status: AC
  Administered 2022-06-09: 40 meq via ORAL
  Filled 2022-06-09: qty 2

## 2022-06-09 MED ORDER — CLONAZEPAM 0.5 MG PO TABS
0.5000 mg | ORAL_TABLET | Freq: Two times a day (BID) | ORAL | Status: DC | PRN
  Administered 2022-06-09: 0.5 mg via ORAL
  Filled 2022-06-09: qty 1

## 2022-06-09 MED ORDER — ENOXAPARIN SODIUM 60 MG/0.6ML IJ SOSY
60.0000 mg | PREFILLED_SYRINGE | INTRAMUSCULAR | Status: DC
  Administered 2022-06-09 – 2022-06-12 (×4): 60 mg via SUBCUTANEOUS
  Filled 2022-06-09 (×4): qty 0.6

## 2022-06-09 MED ORDER — METHYLPREDNISOLONE SODIUM SUCC 125 MG IJ SOLR
125.0000 mg | Freq: Once | INTRAMUSCULAR | Status: AC
  Administered 2022-06-09: 125 mg via INTRAVENOUS
  Filled 2022-06-09: qty 2

## 2022-06-09 MED ORDER — OSELTAMIVIR PHOSPHATE 75 MG PO CAPS: 75.0000 mg | ORAL_CAPSULE | Freq: Two times a day (BID) | ORAL | Status: DC

## 2022-06-09 MED ORDER — PANTOPRAZOLE SODIUM 40 MG PO TBEC
40.0000 mg | DELAYED_RELEASE_TABLET | Freq: Two times a day (BID) | ORAL | Status: DC
  Administered 2022-06-09 – 2022-06-13 (×8): 40 mg via ORAL
  Filled 2022-06-09 (×8): qty 1

## 2022-06-09 MED ORDER — OXYCODONE HCL 5 MG PO TABS
5.0000 mg | ORAL_TABLET | ORAL | Status: DC | PRN
  Administered 2022-06-09 – 2022-06-13 (×17): 5 mg via ORAL
  Filled 2022-06-09 (×17): qty 1

## 2022-06-09 MED ORDER — ISOSORBIDE MONONITRATE ER 30 MG PO TB24: 30.0000 mg | ORAL_TABLET | Freq: Every day | ORAL | Status: DC

## 2022-06-09 MED ORDER — LACTATED RINGERS IV BOLUS
1000.0000 mL | Freq: Once | INTRAVENOUS | Status: AC
  Administered 2022-06-09: 1000 mL via INTRAVENOUS

## 2022-06-09 MED ORDER — AMPHETAMINE-DEXTROAMPHETAMINE 30 MG PO TABS: 30.0000 mg | ORAL_TABLET | Freq: Two times a day (BID) | ORAL | Status: DC

## 2022-06-09 MED ORDER — DULOXETINE HCL 30 MG PO CPEP
60.0000 mg | ORAL_CAPSULE | Freq: Every day | ORAL | Status: DC
  Administered 2022-06-10 – 2022-06-13 (×4): 60 mg via ORAL
  Filled 2022-06-09 (×4): qty 2

## 2022-06-09 MED ORDER — OSELTAMIVIR PHOSPHATE 30 MG PO CAPS
30.0000 mg | ORAL_CAPSULE | Freq: Once | ORAL | Status: AC
  Administered 2022-06-09: 30 mg via ORAL
  Filled 2022-06-09: qty 1

## 2022-06-09 MED ORDER — ACETAMINOPHEN 650 MG RE SUPP: 650.0000 mg | Freq: Four times a day (QID) | RECTAL | Status: DC | PRN

## 2022-06-09 MED ORDER — IPRATROPIUM-ALBUTEROL 0.5-2.5 (3) MG/3ML IN SOLN
3.0000 mL | Freq: Four times a day (QID) | RESPIRATORY_TRACT | Status: DC
  Administered 2022-06-09 – 2022-06-11 (×6): 3 mL via RESPIRATORY_TRACT
  Filled 2022-06-09 (×6): qty 3

## 2022-06-09 MED ORDER — INSULIN GLARGINE-YFGN 100 UNIT/ML ~~LOC~~ SOLN
15.0000 [IU] | Freq: Every day | SUBCUTANEOUS | Status: DC
  Administered 2022-06-10 – 2022-06-12 (×3): 15 [IU] via SUBCUTANEOUS
  Filled 2022-06-09 (×3): qty 0.15

## 2022-06-09 NOTE — ED Triage Notes (Signed)
Pt reports shortness of breath, diarrhea, body aches and headache x few days

## 2022-06-09 NOTE — Plan of Care (Signed)

## 2022-06-09 NOTE — ED Provider Notes (Signed)
[place] Provider Note   CSN: [id number] Arrival date & time: 06/09/22  1105     History  Chief Complaint  Patient presents with   Shortness of Breath    [name] is a 57 y.o. female.  HPI 57 year old female presents with shortness of breath.  She has been feeling poorly since waking up on 2/14.  She had cough that is no longer productive, body aches, headache, diaphoresis.  She is also been having diarrhea.  She has abdominal pain when she has diarrhea but otherwise no pain.  She is short of breath and wheezing and today she had a sharp pain in her chest.  She has had this pain before when she had COPD exacerbations.  She does not currently have a mask for her albuterol nebulizer so has not been using any albuterol.  Home Medications Prior to Admission medications   Medication Sig Start Date End Date Taking? Authorizing Provider  ACCU-CHEK GUIDE test strip USE UP TO FOUR TIMES DAILY AS DIRECTED 08/18/19   Cirigliano, Mary K, DO  Accu-Chek Softclix Lancets lancets USE UP TO FOUR TIMES DAILY AS DIRECTED 06/20/19   Cirigliano, Mary K, DO  albuterol (PROVENTIL) (2.5 MG/3ML) 0.083% nebulizer solution Take 3 mLs (2.5 mg total) by nebulization every 6 (six) hours as needed for wheezing or shortness of breath. 05/13/21   Francene Finders, PA-C  albuterol (VENTOLIN HFA) 108 (90 Base) MCG/ACT inhaler Inhale 2 puffs into the lungs every 6 (six) hours as needed for wheezing or shortness of breath.    [provider]  amphetamine-dextroamphetamine (ADDERALL) 30 MG tablet Take 1 tablet by mouth 2 (two) times daily. 03/03/22 03/03/23  Cloria Spring, MD  amphetamine-dextroamphetamine (ADDERALL) 30 MG tablet Take 1 tablet by mouth 2 (two) times daily. 03/03/22 03/03/23  Cloria Spring, MD  amphetamine-dextroamphetamine (ADDERALL) 30 MG tablet Take 1 tablet by mouth 2 (two) times daily. 05/29/22 05/29/23  Cloria Spring, MD   ARIPiprazole (ABILIFY) 5 MG tablet Take 1 tablet (5 mg total) by mouth daily. 03/03/22 03/03/23  Cloria Spring, MD  atorvastatin (LIPITOR) 40 MG tablet Take 0.5 tablets (20 mg total) by mouth 2 (two) times daily. 06/16/21   Haydee Salter, MD  blood glucose meter kit and supplies Dispense based on patient and insurance preference. Use up to four times daily as directed. (FOR ICD-10 E10.9, E11.9). 06/20/19   Cirigliano, Garvin Fila, DO  clonazePAM (KLONOPIN) 0.5 MG tablet Take 1 tablet (0.5 mg total) by mouth 2 (two) times daily as needed for anxiety. 03/03/22   Cloria Spring, MD  DULoxetine (CYMBALTA) 60 MG capsule TAKE 1 CAPSULE BY MOUTH EVERY DAY 04/03/22   Cloria Spring, MD  FARXIGA 10 MG TABS tablet TAKE 1 TABLET(10 MG) BY MOUTH DAILY 06/27/21   Haydee Salter, MD  fluticasone (FLONASE) 50 MCG/ACT nasal spray PLACE 1 SPRAY INTO BOTH NOSTRILS 2 (TWO) TIMES DAILY AS NEEDED FOR ALLERGIES OR RHINITIS (AFTER SINUS RINSE). 01/26/22   Haydee Salter, MD  furosemide (LASIX) 40 MG tablet AS NEEDED for weight increase of 3 lbs overnight or 5 lbs in 1 week Patient taking differently: Take 40 mg by mouth daily as needed for edema. AS NEEDED for weight increase of 3 lbs overnight or 5 lbs in 1 week 10/29/19   Donato Heinz, MD  glipiZIDE (GLUCOTROL) 5 MG tablet TAKE 1 TABLET(5 MG) BY MOUTH TWICE DAILY BEFORE A MEAL 08/26/21  Haydee Salter, MD  ibuprofen (ADVIL) 200 MG tablet Take 200 mg by mouth every 6 (six) hours as needed for mild pain.    [provider]  insulin glargine (LANTUS) 100 UNIT/ML injection INJECT 0.15 MLS (15 UNITS TOTAL) INTO THE SKIN DAILY. 09/14/21   Haydee Salter, MD  Insulin Pen Needle (BD PEN NEEDLE NANO 2ND GEN) 32G X 4 MM MISC USE DAILY WITH VICTOZA 09/20/21   Haydee Salter, MD  Insulin Syringe-Needle U-100 (INSULIN SYRINGE .5CC/31GX5/16") 31G X 5/16" 0.5 ML MISC USE AS DIRECTED ONCE DAILY 05/23/22   Haydee Salter, MD  ipratropium-albuterol (DUONEB) 0.5-2.5 (3)  MG/3ML SOLN Take 3 mLs by nebulization 3 (three) times daily. 06/16/21   Haydee Salter, MD  isosorbide mononitrate (IMDUR) 30 MG 24 hr tablet TAKE 1 TABLET(30 MG) BY MOUTH DAILY 06/22/21   Belva Crome, MD  losartan (COZAAR) 25 MG tablet Take 1 tablet (25 mg total) by mouth daily. TAKE 1 TABLET(25 MG) BY MOUTH DAILY Strength: 25 mg 06/16/21   Haydee Salter, MD  meloxicam (MOBIC) 15 MG tablet Take 1 tablet (15 mg total) by mouth daily. 03/20/22   Haydee Salter, MD  metoprolol tartrate (LOPRESSOR) 25 MG tablet TAKE 1 TABLET(25 MG) BY MOUTH TWICE DAILY 08/26/21   Donato Heinz, MD  nystatin (MYCOSTATIN/NYSTOP) powder Apply 1 application topically 2 (two) times daily. Patient taking differently: Apply 1 application  topically 2 (two) times daily as needed (rash). 11/19/20   [name], MD  pantoprazole (PROTONIX) 40 MG tablet TAKE 1 TABLET BY MOUTH TWICE A DAY 11/23/21   Haydee Salter, MD  pregabalin (LYRICA) 150 MG capsule TAKE 1 CAPSULE BY MOUTH TWICE A DAY 05/29/22   Haydee Salter, MD  Semaglutide, 2 MG/DOSE, 8 MG/3ML SOPN Inject 2 mg as directed once a week. 12/14/21   Haydee Salter, MD      Allergies    Codeine, Metformin and related, Wellbutrin [bupropion hcl], and Acyclovir and related    Review of Systems   Review of Systems  Constitutional:  Negative for fever.  HENT:  Negative for sore throat.   Respiratory:  Positive for cough and shortness of breath.   Cardiovascular:  Positive for chest pain. Negative for leg swelling.  Gastrointestinal:  Positive for diarrhea.  Musculoskeletal:  Positive for myalgias.  Neurological:  Positive for headaches.    Physical Exam Updated Vital Signs BP 124/84   Pulse 85   Temp 97.8 F (36.6 C) (Oral)   Resp 18   Ht 5' 4"$  (1.626 m)   Wt 124.7 kg   LMP 07/24/2014 Comment: pt has had ablation, but still has some periods  SpO2 93%   BMI 47.20 kg/m  Physical Exam Vitals and nursing note reviewed.  Constitutional:       Appearance: She is well-developed. She is obese.  HENT:     Head: Normocephalic and atraumatic.  Cardiovascular:     Rate and Rhythm: Regular rhythm. Tachycardia present.     Heart sounds: Normal heart sounds.  Pulmonary:     Effort: Pulmonary effort is normal. Tachypnea present. No accessory muscle usage.     Breath sounds: Wheezing (diffuse, expiratory) present.  Abdominal:     Palpations: Abdomen is soft.     Tenderness: There is no abdominal tenderness.  Musculoskeletal:     Right lower leg: No edema.     Left lower leg: No edema.  Skin:    General: Skin is  warm and dry.  Neurological:     Mental Status: She is alert.     ED Results / Procedures / Treatments   Labs (all labs ordered are listed, but only abnormal results are displayed) Labs Reviewed  RESP PANEL BY RT-PCR (RSV, FLU A&B, COVID)  RVPGX2 - Abnormal; Notable for the following components:      Result Value   Influenza A by PCR POSITIVE (*)    All other components within normal limits  CBC WITH DIFFERENTIAL/PLATELET - Abnormal; Notable for the following components:   RBC 6.35 (*)    Hemoglobin 15.4 (*)    HCT 49.7 (*)    MCV 78.3 (*)    MCH 24.3 (*)    RDW 19.4 (*)    All other components within normal limits  COMPREHENSIVE METABOLIC PANEL - Abnormal; Notable for the following components:   Sodium 134 (*)    Potassium 3.2 (*)    Chloride 95 (*)    Glucose, Bld 143 (*)    Creatinine, Ser 1.71 (*)    AST 46 (*)    GFR, Estimated 35 (*)    All other components within normal limits  TROPONIN I (HIGH SENSITIVITY) - Abnormal; Notable for the following components:   Troponin I (High Sensitivity) 57 (*)    All other components within normal limits  BRAIN NATRIURETIC PEPTIDE  TROPONIN I (HIGH SENSITIVITY)    EKG None  Radiology DG Chest Portable 1 View  Result Date: 06/09/2022 CLINICAL DATA:  Cough with shortness of breath, diarrhea, body aches and headaches for a few days. History of COPD and diabetes.  EXAM: PORTABLE CHEST 1 VIEW COMPARISON:  Radiographs 11/20/2021 and 06/02/2021.  CT 05/22/2022. FINDINGS: 1157 hours. Lordotic positioning. The heart size and mediastinal contours are stable. The lungs are clear. There is no pleural effusion or pneumothorax. No acute osseous findings are identified. Telemetry leads overlie the chest. IMPRESSION: No evidence of active cardiopulmonary process. Electronically Signed   By: Richardean Sale M.D.   On: 06/09/2022 12:06    Procedures Procedures    Medications Ordered in ED Medications  oseltamivir (TAMIFLU) capsule 30 mg (has no administration in time range)  lactated ringers bolus 1,000 mL (1,000 mLs Intravenous New Bag/Given 06/09/22 1215)  acetaminophen (TYLENOL) tablet 1,000 mg (1,000 mg Oral Given 06/09/22 1217)  methylPREDNISolone sodium succinate (SOLU-MEDROL) 125 mg/2 mL injection 125 mg (125 mg Intravenous Given 06/09/22 1216)  albuterol (PROVENTIL) (2.5 MG/3ML) 0.083% nebulizer solution 2.5 mg (2.5 mg Nebulization Given 06/09/22 1217)  ipratropium-albuterol (DUONEB) 0.5-2.5 (3) MG/3ML nebulizer solution 3 mL (3 mLs Nebulization Given 06/09/22 1217)  ipratropium-albuterol (DUONEB) 0.5-2.5 (3) MG/3ML nebulizer solution 3 mL (3 mLs Nebulization Given 06/09/22 1333)  albuterol (PROVENTIL) (2.5 MG/3ML) 0.083% nebulizer solution 2.5 mg (2.5 mg Nebulization Given 06/09/22 1333)  fentaNYL (SUBLIMAZE) injection 50 mcg (50 mcg Intravenous Given 06/09/22 1434)    ED Course/ Medical Decision Making/ A&P                             Medical Decision Making Amount and/or Complexity of Data Reviewed Labs: ordered.    Details: Acute kidney injury.  Normal WBC.  Influenza A is positive. Radiology: ordered and independent interpretation performed.    Details: No pneumonia ECG/medicine tests: ordered.  Risk OTC drugs. Prescription drug management. Decision regarding hospitalization.   Patient presents with respiratory failure.  She is found to have O2  sats in the 80s.  She  was given breathing treatments and steroids.  Found to be influenza A positive.  Given she is right around 48 hours but ill enough to be admitted, will start on Tamiflu which has been renally dosed.  She was also given IV fluids.  I suspect her acute kidney injury is from dehydration/hypovolemia.  Will also give some potassium. Discussed with hospitalist for admission.        Final Clinical Impression(s) / ED Diagnoses Final diagnoses:  Acute respiratory failure with hypoxia ([place])  Influenza A  Acute kidney injury [facility])    Rx / DC Orders ED Discharge Orders     None         Sherwood Gambler, MD 06/09/22 1513

## 2022-06-09 NOTE — H&P (Signed)
History and Physical  [name] [id number] DOB: 26-Apr-1965 DOA: 06/09/2022   PCP: Haydee Salter, MD   Patient coming from: Home   Chief Complaint: SOB   HPI: [name] is a 57 y.o. female with medical history significant for tobacco abuse, COPD on room air, insulin-dependent type 2 diabetes, morbid obesity being admitted to the hospital with acute hypoxic respiratory failure due to COPD exacerbation in the setting of influenza A infection.  History is provided by the patient, who tells me that she was in her usual state of health until a couple of days ago, when she started having a cough, shortness of breath and this was also accompanied by body aches, headache, diaphoresis, and some diarrhea.  She continues to smoke about a pack and a half of cigarettes per day.  She denies any chest pain, nausea, vomiting, fevers.  No sick contacts.  ED Course: Initial evaluation in the emergency department revealed normal vital signs except for O2 saturation of 78% on room air.  Patient was placed on 2 L nasal cannula oxygen is now saturating 93%.  Lab work shows normal white blood cell count, hemoglobin 15, potassium 3.2, creatinine up to 1.7 from normal baseline in August 2023.  BNP 23, troponin 57, 53.  Review of Systems: Please see HPI for pertinent positives and negatives. A complete 10 system review of systems are otherwise negative.  Past Medical History:  Diagnosis Date   ADHD    Alcohol abuse    Anemia    Anxiety    Asthmatic bronchitis    normal PFT/ seen by pulmonary no evidence of COPD   Atypical chest pain 10/05/2017   Back pain    Bilateral swelling of feet    Chest pain    Chest pain on respiration 03/25/2014   Chronic bronchitis (HCC)    Chronic lower back pain    Chronic respiratory failure ([place]) 10/16/2011   Newly 02 dep 24/7 p discharge from [facility] 01/2013  - 03/13/2014  Walked RA  2 laps @ 185 ft each stopped due to  Sob/ aching in legs, thirsty/  no desat @ slow pace    Chronic respiratory failure with hypoxia (HCC)    On 2-3 L of oxygen at home   Cigarette smoker 12/02/2010   Followed in Pulmonary clinic/ [facility]/ [place]   - Limits of effective care reviewed [id number]     Complication of anesthesia    Constipation    COPD (chronic obstructive pulmonary disease) ([place])    COPD with chronic bronchitis 10/01/2017   Daily headache    Depression    Diabetic peripheral neuropathy ([place])    DM (diabetes mellitus) type II controlled, neurological manifestation ([place]) 12/02/2010   Drug use    Gallbladder problem    Gastric erosions    EGD 08/2010.   GERD (gastroesophageal reflux disease)    Glaucoma    H/O drug abuse ([place]) 11/12/2017   -- scanned document from outside source: [facility] in [place] which showed UDS positive for Adderall /amphetamine usage as well as positive urine for THC.  This test result was collected 08/11/2016 and reported 10/07/2016. - lso review of the chart shows another positive amphetamine, THC and METH in the urine back on 10/18/2015 under "care everywhere". ---So    Heavy menses    High cholesterol    History of blood transfusion    "related to low HgB" (10/05/2017)   History of  hiatal hernia    HTN (hypertension)    Hyperlipidemia associated with type 2 diabetes mellitus ([place]) 10/18/2015   Hypertension associated with diabetes ([place]) 12/02/2010   D/c acei 12/22/2011 due to psuedowheeze and narcotic dependent cough> ? Improved - [id number] started bystolic in place of cozar due to cough     IBS (irritable bowel syndrome)    Increased urinary protein excretion    Internal hemorrhoids    Colonoscopy 5/12.   Iron deficiency anemia 10/01/2017   Joint pain    Mixed diabetic hyperlipidemia associated with type 2 diabetes mellitus ([place]) 10/01/2017   On home oxygen therapy    "5L at night" (10/05/2017)   OSA on CPAP    Osteoarthritis    "back" (10/05/2017)   Oxygen dependent  10/16/2011   Pneumonia    "lots of times" (10/05/2017)   PONV (postoperative nausea and vomiting)    Poorly controlled diabetes mellitus ([place]) 11/12/2017   Pulmonary infiltrates 12/22/2011   Followed in Pulmonary clinic/ [facility]/ [place]    - See CT Chest 05/05/11    Shortness of breath    Tachycardia    never had test done since no insurance   Tobacco use disorder-current smoker greater than 40-pack-year history- since age 89 1 ppd 10/01/2017   Type II diabetes mellitus ([place])    Vitamin D deficiency    Past Surgical History:  Procedure Laterality Date   CESAREAN SECTION  1988; [place]   COLONOSCOPY  09/16/2010   [id number] colon/small internal hemorrhoids   ESOPHAGOGASTRODUODENOSCOPY  09/16/2010   SLF: normal/mild gastritis   ESOPHAGOGASTRODUODENOSCOPY N/A 10/19/2014   Procedure: ESOPHAGOGASTRODUODENOSCOPY (EGD);  Surgeon: Danie Binder, MD;  Location: [facility];  Service: Endoscopy;  Laterality: N/A;  [place]  01/17/2012   Procedure: HYSTEROSCOPY WITH THERMACHOICE;  Surgeon: Florian Buff, MD;  Location: [facility] ORS;  Service: Gynecology;  Laterality: N/A;  total therapy time: 9:13sec  D5W  18 ml in, D5W   41m out, temperture 87degrees celcious   KIDNEY SURGERY     as child for blockages   LEFT HEART CATH AND CORONARY ANGIOGRAPHY N/A 09/08/2019   Procedure: LEFT HEART CATH AND CORONARY ANGIOGRAPHY;  Surgeon: JMartinique Peter M, MD;  Location[facility];  Service: Cardiovascular;  Laterality: N/A;   TONSILLECTOMY     TUBAL LIGATION  1989   TYMPANOSTOMY TUBE PLACEMENT Bilateral    "several times when I was a child"   uterine ablation     WRIST FRACTURE SURGERY Left 1995    Social History:  reports that she has been smoking cigarettes and e-cigarettes. She started smoking about 44 years ago. She has a 60.00 pack-year smoking history. She uses smokeless tobacco. She reports that she does not currently  use alcohol. She reports current drug use. Drug: Marijuana.   Allergies  Allergen Reactions   Codeine Itching and Nausea Only   Metformin And Related Diarrhea and Other (See Comments)    Stomach pain/nausea   Wellbutrin [Bupropion Hcl] Hives   Acyclovir And Related Rash    Family History  Problem Relation Age of Onset   Heart attack Mother 512      deceased   Diabetes Mother    Breast cancer Mother 468      inflammatory breast ca   Heart failure Mother        oxygen dependence, nonsmoker   Heart disease  Mother    Depression Mother    Cancer Mother        Breast   Hypertension Mother    Hyperlipidemia Mother    Sudden death Mother    Sleep apnea Mother    Obesity Mother    Heart attack Father 22       deceased, etoh use   Heart disease Father    Alcohol abuse Father    Depression Father    Heart failure Father    Sudden death Father    Ulcers Sister    Hypertension Sister    Heart failure Sister    Depression Sister    Anxiety disorder Sister    Liver disease Maternal Aunt 22       died while on liver transplant list   Liver disease Maternal Uncle    Cancer Paternal Aunt        Breast   Heart disease Maternal Grandmother    Heart attack Maternal Grandmother        premature CAD   Colon cancer Neg Hx      Prior to Admission medications   Medication Sig Start Date End Date Taking? Authorizing Provider  ACCU-CHEK GUIDE test strip USE UP TO FOUR TIMES DAILY AS DIRECTED 08/18/19   Cirigliano, Mary K, DO  Accu-Chek Softclix Lancets lancets USE UP TO FOUR TIMES DAILY AS DIRECTED 06/20/19   Cirigliano, Mary K, DO  albuterol (PROVENTIL) (2.5 MG/3ML) 0.083% nebulizer solution Take 3 mLs (2.5 mg total) by nebulization every 6 (six) hours as needed for wheezing or shortness of breath. 05/13/21   Francene Finders, PA-C  albuterol (VENTOLIN HFA) 108 (90 Base) MCG/ACT inhaler Inhale 2 puffs into the lungs every 6 (six) hours as needed for wheezing or shortness of breath.     [provider]  amphetamine-dextroamphetamine (ADDERALL) 30 MG tablet Take 1 tablet by mouth 2 (two) times daily. 03/03/22 03/03/23  Cloria Spring, MD  amphetamine-dextroamphetamine (ADDERALL) 30 MG tablet Take 1 tablet by mouth 2 (two) times daily. 03/03/22 03/03/23  Cloria Spring, MD  amphetamine-dextroamphetamine (ADDERALL) 30 MG tablet Take 1 tablet by mouth 2 (two) times daily. 05/29/22 05/29/23  Cloria Spring, MD  ARIPiprazole (ABILIFY) 5 MG tablet Take 1 tablet (5 mg total) by mouth daily. 03/03/22 03/03/23  Cloria Spring, MD  atorvastatin (LIPITOR) 40 MG tablet Take 0.5 tablets (20 mg total) by mouth 2 (two) times daily. 06/16/21   Haydee Salter, MD  blood glucose meter kit and supplies Dispense based on patient and insurance preference. Use up to four times daily as directed. (FOR ICD-10 E10.9, E11.9). 06/20/19   Cirigliano, Garvin Fila, DO  clonazePAM (KLONOPIN) 0.5 MG tablet Take 1 tablet (0.5 mg total) by mouth 2 (two) times daily as needed for anxiety. 03/03/22   Cloria Spring, MD  DULoxetine (CYMBALTA) 60 MG capsule TAKE 1 CAPSULE BY MOUTH EVERY DAY 04/03/22   Cloria Spring, MD  FARXIGA 10 MG TABS tablet TAKE 1 TABLET(10 MG) BY MOUTH DAILY 06/27/21   Haydee Salter, MD  fluticasone (FLONASE) 50 MCG/ACT nasal spray PLACE 1 SPRAY INTO BOTH NOSTRILS 2 (TWO) TIMES DAILY AS NEEDED FOR ALLERGIES OR RHINITIS (AFTER SINUS RINSE). 01/26/22   Haydee Salter, MD  furosemide (LASIX) 40 MG tablet AS NEEDED for weight increase of 3 lbs overnight or 5 lbs in 1 week Patient taking differently: Take 40 mg by mouth daily as needed for edema. AS NEEDED for weight increase  of 3 lbs overnight or 5 lbs in 1 week 10/29/19   Donato Heinz, MD  glipiZIDE (GLUCOTROL) 5 MG tablet TAKE 1 TABLET(5 MG) BY MOUTH TWICE DAILY BEFORE A MEAL 08/26/21   Haydee Salter, MD  ibuprofen (ADVIL) 200 MG tablet Take 200 mg by mouth every 6 (six) hours as needed for mild pain.    [provider]  insulin  glargine (LANTUS) 100 UNIT/ML injection INJECT 0.15 MLS (15 UNITS TOTAL) INTO THE SKIN DAILY. 09/14/21   Haydee Salter, MD  Insulin Pen Needle (BD PEN NEEDLE NANO 2ND GEN) 32G X 4 MM MISC USE DAILY WITH VICTOZA 09/20/21   Haydee Salter, MD  Insulin Syringe-Needle U-100 (INSULIN SYRINGE .5CC/31GX5/16") 31G X 5/16" 0.5 ML MISC USE AS DIRECTED ONCE DAILY 05/23/22   Haydee Salter, MD  ipratropium-albuterol (DUONEB) 0.5-2.5 (3) MG/3ML SOLN Take 3 mLs by nebulization 3 (three) times daily. 06/16/21   Haydee Salter, MD  isosorbide mononitrate (IMDUR) 30 MG 24 hr tablet TAKE 1 TABLET(30 MG) BY MOUTH DAILY 06/22/21   Belva Crome, MD  losartan (COZAAR) 25 MG tablet Take 1 tablet (25 mg total) by mouth daily. TAKE 1 TABLET(25 MG) BY MOUTH DAILY Strength: 25 mg 06/16/21   Haydee Salter, MD  meloxicam (MOBIC) 15 MG tablet Take 1 tablet (15 mg total) by mouth daily. 03/20/22   Haydee Salter, MD  metoprolol tartrate (LOPRESSOR) 25 MG tablet TAKE 1 TABLET(25 MG) BY MOUTH TWICE DAILY 08/26/21   Donato Heinz, MD  nystatin (MYCOSTATIN/NYSTOP) powder Apply 1 application topically 2 (two) times daily. Patient taking differently: Apply 1 application  topically 2 (two) times daily as needed (rash). 11/19/20   [name], MD  pantoprazole (PROTONIX) 40 MG tablet TAKE 1 TABLET BY MOUTH TWICE A DAY 11/23/21   Haydee Salter, MD  pregabalin (LYRICA) 150 MG capsule TAKE 1 CAPSULE BY MOUTH TWICE A DAY 05/29/22   Haydee Salter, MD  Semaglutide, 2 MG/DOSE, 8 MG/3ML SOPN Inject 2 mg as directed once a week. 12/14/21   Haydee Salter, MD    Physical Exam: BP 124/84   Pulse 85   Temp 97.8 F (36.6 C) (Oral)   Resp 18   Ht 5' 4"$  (1.626 m)   Wt 124.7 kg   LMP 07/24/2014 Comment: pt has had ablation, but still has some periods  SpO2 93%   BMI 47.20 kg/m   General:  Alert, oriented, calm, in no acute distress, tired appearing but nontoxic, wearing 2 L nasal cannula oxygen.  Able to speak in full  sentences, intermittent dry cough.  Remains at the bedside. Eyes: EOMI, clear conjuctivae, white sclerea Neck: supple, no masses, trachea mildline  Cardiovascular: RRR, no murmurs or rubs, no peripheral edema  Respiratory: Breath sounds are distant, but with equal bilateral air entry.  There is diffuse rhonchi without active wheezing currently.  No tachypnea, retractions, or other evidence of respiratory distress. Abdomen: soft, nontender, nondistended, normal bowel tones heard  Skin: dry, no rashes  Musculoskeletal: no joint effusions, normal range of motion  Psychiatric: appropriate affect, normal speech  Neurologic: extraocular muscles intact, clear speech, moving all extremities with intact sensorium          Labs on Admission:  Basic Metabolic Panel: Recent Labs  Lab 06/09/22 1218  NA 134*  K 3.2*  CL 95*  CO2 24  GLUCOSE 143*  BUN 19  CREATININE 1.71*  CALCIUM 8.9   Liver Function  Tests: Recent Labs  Lab 06/09/22 1218  AST 46*  ALT 29  ALKPHOS 59  BILITOT 0.6  PROT 7.7  ALBUMIN 3.9   No results for input(s): "LIPASE", "AMYLASE" in the last 168 hours. No results for input(s): "AMMONIA" in the last 168 hours. CBC: Recent Labs  Lab 06/09/22 1218  WBC 6.9  NEUTROABS 4.8  HGB 15.4*  HCT 49.7*  MCV 78.3*  PLT 211   Cardiac Enzymes: No results for input(s): "CKTOTAL", "CKMB", "CKMBINDEX", "TROPONINI" in the last 168 hours.  BNP (last 3 results) Recent Labs    06/09/22 1218  BNP 23.5    ProBNP (last 3 results) No results for input(s): "PROBNP" in the last 8760 hours.  CBG: No results for input(s): "GLUCAP" in the last 168 hours.  Radiological Exams on Admission: DG Chest Portable 1 View  Result Date: 06/09/2022 CLINICAL DATA:  Cough with shortness of breath, diarrhea, body aches and headaches for a few days. History of COPD and diabetes. EXAM: PORTABLE CHEST 1 VIEW COMPARISON:  Radiographs 11/20/2021 and 06/02/2021.  CT 05/22/2022. FINDINGS: 1157  hours. Lordotic positioning. The heart size and mediastinal contours are stable. The lungs are clear. There is no pleural effusion or pneumothorax. No acute osseous findings are identified. Telemetry leads overlie the chest. IMPRESSION: No evidence of active cardiopulmonary process. Electronically Signed   By: Richardean Sale M.D.   On: 06/09/2022 12:06    Assessment/Plan Principal Problem:   Acute respiratory failure with hypoxia (HCC) -likely due to combination of COPD exacerbation, in the setting of influenza A infection. -Inpatient admission -Mental oxygen to keep O2 saturation above 90% wean as tolerated -QID DuoNebs, as needed albuterol inhaler, IV steroids -Tamiflu renally dosed for 5 days Active Problems:   Morbid obesity with BMI of 45.0-49.9, adult ([place]) -complicating all aspects of care   GERD (gastroesophageal reflux disease)   COPD with acute exacerbation (HCC)   Essential hypertension -continue Imdur, hold Coreg and ARB due to borderline low blood pressure and AKI   Type 2 diabetes mellitus with polyneuropathy ([place]) -diabetic diet when eating, with sliding scale insulin.  Holding Panola due to AKI.   Influenza A -treat with Tamiflu as above   Tobacco use disorder -nicotine patch   AKI (acute kidney injury) ([place]) -etiology unclear, may be due to relative dehydration and continued use of nephrotoxic drugs in the setting of acute infection.  Will hydrate gently, avoid nephrotoxins and follow renal function in the morning.  DVT prophylaxis: Lovenox   Code Status: FULL   Family Communication: Plan of care discussed in detail with the patient and her husband at the bedside in the ER at the time of admission.  Consults called: None   Admission status: Inpatient   Time spent: 48 minutes  Reannah Totten Neva Seat MD [facility] Pager [phone]  If 7PM-7AM, please contact night-coverage [email or web address] Password [facility]  06/09/2022, 3:32 PM

## 2022-06-10 ENCOUNTER — Encounter (HOSPITAL_COMMUNITY): Payer: Self-pay | Admitting: Internal Medicine

## 2022-06-10 DIAGNOSIS — J9601 Acute respiratory failure with hypoxia: Secondary | ICD-10-CM | POA: Diagnosis not present

## 2022-06-10 LAB — GLUCOSE, CAPILLARY
Glucose-Capillary: 161 mg/dL — ABNORMAL HIGH (ref 70–99)
Glucose-Capillary: 208 mg/dL — ABNORMAL HIGH (ref 70–99)
Glucose-Capillary: 240 mg/dL — ABNORMAL HIGH (ref 70–99)
Glucose-Capillary: 331 mg/dL — ABNORMAL HIGH (ref 70–99)

## 2022-06-10 LAB — BASIC METABOLIC PANEL
Anion gap: 14 (ref 5–15)
BUN: 25 mg/dL — ABNORMAL HIGH (ref 6–20)
CO2: 23 mmol/L (ref 22–32)
Calcium: 8.9 mg/dL (ref 8.9–10.3)
Chloride: 96 mmol/L — ABNORMAL LOW (ref 98–111)
Creatinine, Ser: 1.04 mg/dL — ABNORMAL HIGH (ref 0.44–1.00)
GFR, Estimated: >60 mL/min (ref >60)
Glucose, Bld: 164 mg/dL — ABNORMAL HIGH (ref 70–99)
Potassium: 4.5 mmol/L (ref 3.5–5.1)
Sodium: 133 mmol/L — ABNORMAL LOW (ref 135–145)

## 2022-06-10 LAB — CBC
HCT: 47.6 % — ABNORMAL HIGH (ref 36.0–46.0)
Hemoglobin: 14.6 g/dL (ref 12.0–15.0)
MCH: 24.3 pg — ABNORMAL LOW (ref 26.0–34.0)
MCHC: 30.7 g/dL (ref 30.0–36.0)
MCV: 79.1 fL — ABNORMAL LOW (ref 80.0–100.0)
Platelets: 183 10*3/uL (ref 150–400)
RBC: 6.02 MIL/uL — ABNORMAL HIGH (ref 3.87–5.11)
RDW: 18.9 % — ABNORMAL HIGH (ref 11.5–15.5)
WBC: 6.2 10*3/uL (ref 4.0–10.5)
nRBC: 0 % (ref 0.0–0.2)

## 2022-06-10 LAB — HIV ANTIBODY (ROUTINE TESTING W REFLEX): HIV Screen 4th Generation wRfx: NONREACTIVE

## 2022-06-10 MED ORDER — OSELTAMIVIR PHOSPHATE 75 MG PO CAPS
75.0000 mg | ORAL_CAPSULE | Freq: Two times a day (BID) | ORAL | Status: DC
  Administered 2022-06-10 – 2022-06-13 (×6): 75 mg via ORAL
  Filled 2022-06-10 (×6): qty 1

## 2022-06-10 MED ORDER — AMPHETAMINE-DEXTROAMPHETAMINE 10 MG PO TABS
30.0000 mg | ORAL_TABLET | Freq: Two times a day (BID) | ORAL | Status: DC
  Administered 2022-06-10 – 2022-06-13 (×6): 30 mg via ORAL
  Filled 2022-06-10 (×6): qty 3

## 2022-06-10 MED ORDER — METHYLPREDNISOLONE SODIUM SUCC 125 MG IJ SOLR
60.0000 mg | Freq: Two times a day (BID) | INTRAMUSCULAR | Status: DC
  Administered 2022-06-10 – 2022-06-12 (×4): 60 mg via INTRAVENOUS
  Filled 2022-06-10 (×4): qty 2

## 2022-06-10 NOTE — Progress Notes (Signed)
PHARMACY NOTE:  ANTIMICROBIAL RENAL DOSAGE ADJUSTMENT  Current antimicrobial regimen includes a mismatch between antimicrobial dosage and estimated renal function.  As per policy approved by the Pharmacy & Therapeutics and Medical Executive Committees, the antimicrobial dosage will be adjusted accordingly.  Current antimicrobial dosage:  oseltamivir 34m PO BID  Indication: influenza A  Renal Function:  Estimated Creatinine Clearance: 78.9 mL/min (A) (by C-G formula based on SCr of 1.04 mg/dL (H)).    Antimicrobial dosage has been changed to:  oseltamivir 739mPO BID   Thank you for allowing pharmacy to be a part of this patient's care.   ChGretta ArabharmD, BCPS [facility] main pharmacy [phone]/17/2024 2:30 PM

## 2022-06-10 NOTE — Progress Notes (Signed)
PROGRESS NOTE    [name]  [id number] DOB: [birth year] DOA: 06/09/2022 PCP: Haydee Salter, MD   Brief Narrative:  [name] is a 57 y.o. female with medical history significant for tobacco abuse, COPD on room air, insulin-dependent type 2 diabetes, morbid obesity being admitted to the hospital with acute hypoxic respiratory failure due to COPD exacerbation in the setting of influenza A infection   ED Course: Initial evaluation in the emergency department revealed normal vital signs except for O2 saturation of 78% on room air.  Patient was placed on 2 L nasal cannula oxygen is now saturating 93%.  Lab work shows normal white blood cell count, hemoglobin 15, potassium 3.2, creatinine up to 1.7 from normal baseline in August 2023.  BNP 23, troponin 57, 53.  Patient started on IV Solu-Medrol and Tamiflu admitted for further management of acute hypoxemic respiratory failure in the setting of COPD exacerbation and influenza A virus  Assessment & Plan:   Acute hypoxemic respiratory failure in the setting of COPD exacerbation and influenza A virus: -Patient has underlying COPD.  Noted to have hypoxic upon arrival.  Placed on 2 L of oxygen via nasal cannula. -UA positive.  Chest CT negative for infection.  Wheezing noted on exam.  Started on Solu-Medrol and Tamiflu -Continues to have wheezing on exam today.  I will go up on Solu-Medrol dose -Will wean off of oxygen as tolerated -Continue DuoNeb and albuterol as needed. -Added spirometry  AKI: -Received IV fluids in the ER. -Kidney function have improved.  Will continue to avoid nephrotoxic medication and monitor renal trend closely  Hypertension: -Blood pressure is labile.  Continue Imdur, continue to hold Coreg and ARB at this time  -Will continue to monitor  Hypokalemia: Replenished.  Type 2 diabetes on long-term current use of insulin: Diabetic peripheral neuropathy -Well-controlled.  Last A1c 7.1.   Continue sliding scale insulin, Semglee 15 units daily and glipizide -Continue Lyrica  GERD: Continue PPI  Chronic back pain: Continue oxycodone as needed  Major depressive disorder, recurrent, severe without psychotic features: Anxiety: -Home medication Abilify, clonazepam and Cymbalta  ADHD: -Continue Adderall  Tobacco abuse: Counseled about cessation.  Nicotine patch  Morbid obesity with BMI of 47: -Diet modification, exercise and weight loss recommended  Three-vessel coronary atherosclerosis Aortic atherosclerosis: Noted on CT chest on 05/22/2022 -Continue statin.  Follow-up with PCP outpatient  DVT prophylaxis: Lovenox Code Status: Full code Family Communication: None  present at bedside.  Plan of care discussed with patient in length and he verbalized understanding and agreed with it. Disposition Plan: Home in 1 to 2 days  Consultants:  None Procedures:  None  Antimicrobials:  None  Status is: Inpatient    Subjective: Patient seen and examined.  Sitting comfortably on the bed.  Reports that overall breathing has improved.  Still has some wheezing.  Continues to be on 2 L of oxygen via nasal cannula.  Remained afebrile.  No acute event overnight.  Objective: Vitals:   06/09/22 2111 06/10/22 0138 06/10/22 0555 06/10/22 0904  BP: (!) 143/52 129/61 (!) 153/72   Pulse: 68 77 79   Resp: 20 20 19   $ Temp: (!) 97.4 F (36.3 C) (!) 97.4 F (36.3 C) 98.2 F (36.8 C)   TempSrc: Oral Oral    SpO2: 90% 90% 94% 91%  Weight:      Height:        Intake/Output Summary (Last 24 hours) at 06/10/2022 1023 Last data filed at 06/09/2022  1830 Gross per 24 hour  Intake 360 ml  Output --  Net 360 ml   Filed Weights   06/09/22 1121  Weight: 124.7 kg    Examination:  General exam: Appears calm and comfortable, on nasal cannula, morbidly obese Respiratory system: Diffuse expiratory wheezing noted Cardiovascular system: S1 & S2 heard, RRR. No JVD, murmurs, rubs,  gallops or clicks. No pedal edema. Gastrointestinal system: Abdomen is nondistended, soft and nontender. No organomegaly or masses felt. Normal bowel sounds heard. Central nervous system: Alert and oriented. No focal neurological deficits. Extremities: Symmetric 5 x 5 power. Skin: No rashes, lesions or ulcers Psychiatry: Judgement and insight appear normal. Mood & affect appropriate.    Data Reviewed: I have personally reviewed following labs and imaging studies  CBC: Recent Labs  Lab 06/09/22 1218 06/10/22 0756  WBC 6.9 6.2  NEUTROABS 4.8  --   HGB 15.4* 14.6  HCT 49.7* 47.6*  MCV 78.3* 79.1*  PLT 211 [id number]   Basic Metabolic Panel: Recent Labs  Lab 06/09/22 1218 06/10/22 0756  NA 134* 133*  K 3.2* 4.5  CL 95* 96*  CO2 24 23  GLUCOSE 143* 164*  BUN 19 25*  CREATININE 1.71* 1.04*  CALCIUM 8.9 8.9   GFR: Estimated Creatinine Clearance: 78.9 mL/min (A) (by C-G formula based on SCr of 1.04 mg/dL (H)). Liver Function Tests: Recent Labs  Lab 06/09/22 1218  AST 46*  ALT 29  ALKPHOS 59  BILITOT 0.6  PROT 7.7  ALBUMIN 3.9   No results for input(s): "LIPASE", "AMYLASE" in the last 168 hours. No results for input(s): "AMMONIA" in the last 168 hours. Coagulation Profile: No results for input(s): "INR", "PROTIME" in the last 168 hours. Cardiac Enzymes: No results for input(s): "CKTOTAL", "CKMB", "CKMBINDEX", "TROPONINI" in the last 168 hours. BNP (last 3 results) No results for input(s): "PROBNP" in the last 8760 hours. HbA1C: No results for input(s): "HGBA1C" in the last 72 hours. CBG: Recent Labs  Lab 06/09/22 1713 06/09/22 1928 06/09/22 2358 06/10/22 0754  GLUCAP 197* 241* 183* 161*   Lipid Profile: No results for input(s): "CHOL", "HDL", "LDLCALC", "TRIG", "CHOLHDL", "LDLDIRECT" in the last 72 hours. Thyroid Function Tests: No results for input(s): "TSH", "T4TOTAL", "FREET4", "T3FREE", "THYROIDAB" in the last 72 hours. Anemia Panel: No results for  input(s): "VITAMINB12", "FOLATE", "FERRITIN", "TIBC", "IRON", "RETICCTPCT" in the last 72 hours. Sepsis Labs: No results for input(s): "PROCALCITON", "LATICACIDVEN" in the last 168 hours.  Recent Results (from the past 240 hour(s))  Resp panel by RT-PCR (RSV, Flu A&B, Covid) Anterior Nasal Swab     Status: Abnormal   Collection Time: 06/09/22 12:18 PM   Specimen: Anterior Nasal Swab  Result Value Ref Range Status   SARS Coronavirus 2 by RT PCR NEGATIVE NEGATIVE Final    Comment: (NOTE) SARS-CoV-2 target nucleic acids are NOT DETECTED.  The SARS-CoV-2 RNA is generally detectable in upper respiratory specimens during the acute phase of infection. The lowest concentration of SARS-CoV-2 viral copies this assay can detect is 138 copies/mL. A negative result does not preclude SARS-Cov-2 infection and should not be used as the sole basis for treatment or other patient management decisions. A negative result may occur with  improper specimen collection/handling, submission of specimen other than nasopharyngeal swab, presence of viral mutation(s) within the areas targeted by this assay, and inadequate number of viral copies(<138 copies/mL). A negative result must be combined with clinical observations, patient history, and epidemiological information. The expected result is Negative.  Fact Sheet  for Patients:  EntrepreneurPulse.com.au  Fact Sheet for Healthcare Providers:  IncredibleEmployment.be  This test is no t yet approved or cleared by the [place] FDA and  has been authorized for detection and/or diagnosis of SARS-CoV-2 by FDA under an Emergency Use Authorization (EUA). This EUA will remain  in effect (meaning this test can be used) for the duration of the COVID-19 declaration under Section 564(b)(1) of the Act, 21 U.S.C.section 360bbb-3(b)(1), unless the authorization is terminated  or revoked sooner.       Influenza A by PCR POSITIVE  (A) NEGATIVE Final   Influenza B by PCR NEGATIVE NEGATIVE Final    Comment: (NOTE) The Xpert Xpress SARS-CoV-2/FLU/RSV plus assay is intended as an aid in the diagnosis of influenza from Nasopharyngeal swab specimens and should not be used as a sole basis for treatment. Nasal washings and aspirates are unacceptable for Xpert Xpress SARS-CoV-2/FLU/RSV testing.  Fact Sheet for Patients: EntrepreneurPulse.com.au  Fact Sheet for Healthcare Providers: IncredibleEmployment.be  This test is not yet approved or cleared by the [place] FDA and has been authorized for detection and/or diagnosis of SARS-CoV-2 by FDA under an Emergency Use Authorization (EUA). This EUA will remain in effect (meaning this test can be used) for the duration of the COVID-19 declaration under Section 564(b)(1) of the Act, 21 U.S.C. section 360bbb-3(b)(1), unless the authorization is terminated or revoked.     Resp Syncytial Virus by PCR NEGATIVE NEGATIVE Final    Comment: (NOTE) Fact Sheet for Patients: EntrepreneurPulse.com.au  Fact Sheet for Healthcare Providers: IncredibleEmployment.be  This test is not yet approved or cleared by the [place] FDA and has been authorized for detection and/or diagnosis of SARS-CoV-2 by FDA under an Emergency Use Authorization (EUA). This EUA will remain in effect (meaning this test can be used) for the duration of the COVID-19 declaration under Section 564(b)(1) of the Act, 21 U.S.C. section 360bbb-3(b)(1), unless the authorization is terminated or revoked.  Performed at [facility], [place] [address], [place], [place] [address]       Radiology Studies: DG Chest Portable 1 View  Result Date: 06/09/2022 CLINICAL DATA:  Cough with shortness of breath, diarrhea, body aches and headaches for a few days. History of COPD and diabetes. EXAM: PORTABLE CHEST 1 VIEW COMPARISON:   Radiographs 11/20/2021 and 06/02/2021.  CT 05/22/2022. FINDINGS: 1157 hours. Lordotic positioning. The heart size and mediastinal contours are stable. The lungs are clear. There is no pleural effusion or pneumothorax. No acute osseous findings are identified. Telemetry leads overlie the chest. IMPRESSION: No evidence of active cardiopulmonary process. Electronically Signed   By: Richardean Sale M.D.   On: 06/09/2022 12:06    Scheduled Meds:  amphetamine-dextroamphetamine  30 mg Oral BID   ARIPiprazole  5 mg Oral Daily   atorvastatin  20 mg Oral BID   docusate sodium  100 mg Oral BID   DULoxetine  60 mg Oral Daily   enoxaparin (LOVENOX) injection  60 mg Subcutaneous Q24H   glipiZIDE  5 mg Oral BID AC   insulin aspart  0-15 Units Subcutaneous TID WC   insulin aspart  0-5 Units Subcutaneous QHS   insulin glargine-yfgn  15 Units Subcutaneous Daily   ipratropium-albuterol  3 mL Nebulization QID   methylPREDNISolone (SOLU-MEDROL) injection  60 mg Intravenous Q12H   nicotine  14 mg Transdermal Daily   oseltamivir  30 mg Oral BID   pantoprazole  40 mg Oral BID   pregabalin  150 mg Oral BID  Continuous Infusions:   LOS: 1 day   Time spent: 35 minutes   Burnard Enis Loann Quill, MD [facility]  If 7PM-7AM, please contact night-coverage [email or web address] 06/10/2022, 10:23 AM

## 2022-06-10 NOTE — Progress Notes (Signed)
  Transition of Care [facility]) Screening Note   Patient Details  Name: [name] Date of Birth: 25-Sep-1965   Transition of Care [facility]) CM/SW Contact:    Henrietta Dine, RN Phone Number: 06/10/2022, 9:53 AM    Transition of Care Department [facility]) has reviewed patient and no TOC needs have been identified at this time. We will continue to monitor patient advancement through interdisciplinary progression rounds. If new patient transition needs arise, please place a TOC consult.

## 2022-06-11 DIAGNOSIS — J9601 Acute respiratory failure with hypoxia: Secondary | ICD-10-CM | POA: Diagnosis not present

## 2022-06-11 LAB — BASIC METABOLIC PANEL
Anion gap: 12 (ref 5–15)
BUN: 33 mg/dL — ABNORMAL HIGH (ref 6–20)
CO2: 27 mmol/L (ref 22–32)
Calcium: 9.1 mg/dL (ref 8.9–10.3)
Chloride: 95 mmol/L — ABNORMAL LOW (ref 98–111)
Creatinine, Ser: 1.03 mg/dL — ABNORMAL HIGH (ref 0.44–1.00)
GFR, Estimated: >60 mL/min (ref >60)
Glucose, Bld: 232 mg/dL — ABNORMAL HIGH (ref 70–99)
Potassium: 4.2 mmol/L (ref 3.5–5.1)
Sodium: 134 mmol/L — ABNORMAL LOW (ref 135–145)

## 2022-06-11 LAB — CBC
HCT: 45 % (ref 36.0–46.0)
Hemoglobin: 13.9 g/dL (ref 12.0–15.0)
MCH: 24.2 pg — ABNORMAL LOW (ref 26.0–34.0)
MCHC: 30.9 g/dL (ref 30.0–36.0)
MCV: 78.3 fL — ABNORMAL LOW (ref 80.0–100.0)
Platelets: 184 10*3/uL (ref 150–400)
RBC: 5.75 MIL/uL — ABNORMAL HIGH (ref 3.87–5.11)
RDW: 18.7 % — ABNORMAL HIGH (ref 11.5–15.5)
WBC: 14.1 10*3/uL — ABNORMAL HIGH (ref 4.0–10.5)
nRBC: 0 % (ref 0.0–0.2)

## 2022-06-11 LAB — GLUCOSE, CAPILLARY
Glucose-Capillary: 242 mg/dL — ABNORMAL HIGH (ref 70–99)
Glucose-Capillary: 268 mg/dL — ABNORMAL HIGH (ref 70–99)
Glucose-Capillary: 228 mg/dL — ABNORMAL HIGH (ref 70–99)
Glucose-Capillary: 263 mg/dL — ABNORMAL HIGH (ref 70–99)

## 2022-06-11 LAB — MAGNESIUM: Magnesium: 2.8 mg/dL — ABNORMAL HIGH (ref 1.7–2.4)

## 2022-06-11 MED ORDER — IPRATROPIUM-ALBUTEROL 0.5-2.5 (3) MG/3ML IN SOLN
3.0000 mL | Freq: Three times a day (TID) | RESPIRATORY_TRACT | Status: DC
  Administered 2022-06-11 – 2022-06-13 (×6): 3 mL via RESPIRATORY_TRACT
  Filled 2022-06-11 (×6): qty 3

## 2022-06-11 NOTE — Progress Notes (Signed)
PROGRESS NOTE    [name]  [id number] DOB: 03-14-1966 DOA: 06/09/2022 PCP: Haydee Salter, MD   Brief Narrative:  [name] is a 57 y.o. female with medical history significant for tobacco abuse, COPD on room air, insulin-dependent type 2 diabetes, morbid obesity being admitted to the hospital with acute hypoxic respiratory failure due to COPD exacerbation in the setting of influenza A infection   ED Course: Initial evaluation in the emergency department revealed normal vital signs except for O2 saturation of 78% on room air.  Patient was placed on 2 L nasal cannula oxygen is now saturating 93%.  Lab work shows normal white blood cell count, hemoglobin 15, potassium 3.2, creatinine up to 1.7 from normal baseline in August 2023.  BNP 23, troponin 57, 53.  Patient started on IV Solu-Medrol and Tamiflu admitted for further management of acute hypoxemic respiratory failure in the setting of COPD exacerbation and influenza A virus  Assessment & Plan:   Acute hypoxemic respiratory failure in the setting of COPD exacerbation and influenza A virus: -Patient has underlying COPD.  Noted to have hypoxic upon arrival.  Placed on 2 L of oxygen via nasal cannula. -Flu A positive.  Chest CT negative for infection.  -Continues to have expiratory wheezing, continue Solu-Medrol and Tamiflu -Unable to wean off of oxygen to room air yesterday.  Will try again.  May qualifies home oxygen -Continue DuoNeb and albuterol as needed. -Encouraged incentive spirometry  AKI: -Received IV fluids in the ER. -Kidney function have improved to baseline.  Continue to monitor.  Hypertension: -Blood pressure is stable.  Continue Imdur, continue to hold Coreg and ARB at this time  -Will continue to monitor  Hypokalemia: Replenished.  Type 2 diabetes on long-term current use of insulin: Diabetic peripheral neuropathy -Well-controlled.  Last A1c 7.1.  Continue sliding scale insulin,  Semglee 15 units daily and glipizide -Continue Lyrica for neuropathy  GERD: Continue PPI  Chronic back pain: Continue oxycodone as needed  Major depressive disorder, recurrent, severe without psychotic features: Anxiety: -Home medication Abilify, clonazepam and Cymbalta  ADHD: -Continue Adderall  Tobacco abuse: Counseled about cessation.  Nicotine patch  Morbid obesity with BMI of 47: -Diet modification, exercise and weight loss recommended  Three-vessel coronary atherosclerosis Aortic atherosclerosis: Noted on CT chest on 05/22/2022 -Continue statin.  Follow-up with PCP outpatient  DVT prophylaxis: Lovenox Code Status: Full code Family Communication: None  present at bedside.  Plan of care discussed with patient in length and he verbalized understanding and agreed with it. Disposition Plan: Home in 1 to 2 days  Consultants:  None Procedures:  None  Antimicrobials:  None  Status is: Inpatient    Subjective: Patient seen and examined.  Receiving breathing treatment.  Reports that her breathing have improved however since last night she has a back pain.  Has history of chronic back pain.  Unable to wean off of oxygen to room air yesterday.  Remained afebrile.  No acute events overnight.    Does not use oxygen at home.  Objective: Vitals:   06/10/22 2045 06/11/22 0617 06/11/22 0815 06/11/22 0818  BP:  135/83    Pulse:  85    Resp:  18    Temp:  97.6 F (36.4 C)    TempSrc:  Oral    SpO2: 94% 94% 94% 94%  Weight:      Height:        Intake/Output Summary (Last 24 hours) at 06/11/2022 1058 Last data filed at  06/11/2022 0937 Gross per 24 hour  Intake 240 ml  Output --  Net 240 ml    Filed Weights   06/09/22 1121  Weight: 124.7 kg    Examination:  General exam: Appears calm and comfortable, on nasal cannula, morbidly obese Respiratory system: Diffuse expiratory wheezing noted, not using accessory muscles Cardiovascular system: S1 & S2 heard, RRR. No  JVD, murmurs, rubs, gallops or clicks. No pedal edema. Gastrointestinal system: Abdomen is nondistended, soft and nontender. No organomegaly or masses felt. Normal bowel sounds heard. Central nervous system: Alert and oriented. No focal neurological deficits. Extremities: Symmetric 5 x 5 power. Skin: No rashes, lesions or ulcers Psychiatry: Judgement and insight appear normal. Mood & affect appropriate.    Data Reviewed: I have personally reviewed following labs and imaging studies  CBC: Recent Labs  Lab 06/09/22 1218 06/10/22 0756 06/11/22 0527  WBC 6.9 6.2 14.1*  NEUTROABS 4.8  --   --   HGB 15.4* 14.6 13.9  HCT 49.7* 47.6* 45.0  MCV 78.3* 79.1* 78.3*  PLT 211 183 [id number]    Basic Metabolic Panel: Recent Labs  Lab 06/09/22 1218 06/10/22 0756 06/11/22 0527  NA 134* 133* 134*  K 3.2* 4.5 4.2  CL 95* 96* 95*  CO2 24 23 27  $ GLUCOSE 143* 164* 232*  BUN 19 25* 33*  CREATININE 1.71* 1.04* 1.03*  CALCIUM 8.9 8.9 9.1  MG  --   --  2.8*    GFR: Estimated Creatinine Clearance: 79.6 mL/min (A) (by C-G formula based on SCr of 1.03 mg/dL (H)). Liver Function Tests: Recent Labs  Lab 06/09/22 1218  AST 46*  ALT 29  ALKPHOS 59  BILITOT 0.6  PROT 7.7  ALBUMIN 3.9    No results for input(s): "LIPASE", "AMYLASE" in the last 168 hours. No results for input(s): "AMMONIA" in the last 168 hours. Coagulation Profile: No results for input(s): "INR", "PROTIME" in the last 168 hours. Cardiac Enzymes: No results for input(s): "CKTOTAL", "CKMB", "CKMBINDEX", "TROPONINI" in the last 168 hours. BNP (last 3 results) No results for input(s): "PROBNP" in the last 8760 hours. HbA1C: No results for input(s): "HGBA1C" in the last 72 hours. CBG: Recent Labs  Lab 06/10/22 0754 06/10/22 1153 06/10/22 1649 06/10/22 2012 06/11/22 0756  GLUCAP 161* 208* 240* 331* 242*    Lipid Profile: No results for input(s): "CHOL", "HDL", "LDLCALC", "TRIG", "CHOLHDL", "LDLDIRECT" in the last 72  hours. Thyroid Function Tests: No results for input(s): "TSH", "T4TOTAL", "FREET4", "T3FREE", "THYROIDAB" in the last 72 hours. Anemia Panel: No results for input(s): "VITAMINB12", "FOLATE", "FERRITIN", "TIBC", "IRON", "RETICCTPCT" in the last 72 hours. Sepsis Labs: No results for input(s): "PROCALCITON", "LATICACIDVEN" in the last 168 hours.  Recent Results (from the past 240 hour(s))  Resp panel by RT-PCR (RSV, Flu A&B, Covid) Anterior Nasal Swab     Status: Abnormal   Collection Time: 06/09/22 12:18 PM   Specimen: Anterior Nasal Swab  Result Value Ref Range Status   SARS Coronavirus 2 by RT PCR NEGATIVE NEGATIVE Final    Comment: (NOTE) SARS-CoV-2 target nucleic acids are NOT DETECTED.  The SARS-CoV-2 RNA is generally detectable in upper respiratory specimens during the acute phase of infection. The lowest concentration of SARS-CoV-2 viral copies this assay can detect is 138 copies/mL. A negative result does not preclude SARS-Cov-2 infection and should not be used as the sole basis for treatment or other patient management decisions. A negative result may occur with  improper specimen collection/handling, submission of specimen other than  nasopharyngeal swab, presence of viral mutation(s) within the areas targeted by this assay, and inadequate number of viral copies(<138 copies/mL). A negative result must be combined with clinical observations, patient history, and epidemiological information. The expected result is Negative.  Fact Sheet for Patients:  EntrepreneurPulse.com.au  Fact Sheet for Healthcare Providers:  IncredibleEmployment.be  This test is no t yet approved or cleared by the [place] FDA and  has been authorized for detection and/or diagnosis of SARS-CoV-2 by FDA under an Emergency Use Authorization (EUA). This EUA will remain  in effect (meaning this test can be used) for the duration of the COVID-19 declaration under  Section 564(b)(1) of the Act, 21 U.S.C.section 360bbb-3(b)(1), unless the authorization is terminated  or revoked sooner.       Influenza A by PCR POSITIVE (A) NEGATIVE Final   Influenza B by PCR NEGATIVE NEGATIVE Final    Comment: (NOTE) The Xpert Xpress SARS-CoV-2/FLU/RSV plus assay is intended as an aid in the diagnosis of influenza from Nasopharyngeal swab specimens and should not be used as a sole basis for treatment. Nasal washings and aspirates are unacceptable for Xpert Xpress SARS-CoV-2/FLU/RSV testing.  Fact Sheet for Patients: EntrepreneurPulse.com.au  Fact Sheet for Healthcare Providers: IncredibleEmployment.be  This test is not yet approved or cleared by the [place] FDA and has been authorized for detection and/or diagnosis of SARS-CoV-2 by FDA under an Emergency Use Authorization (EUA). This EUA will remain in effect (meaning this test can be used) for the duration of the COVID-19 declaration under Section 564(b)(1) of the Act, 21 U.S.C. section 360bbb-3(b)(1), unless the authorization is terminated or revoked.     Resp Syncytial Virus by PCR NEGATIVE NEGATIVE Final    Comment: (NOTE) Fact Sheet for Patients: EntrepreneurPulse.com.au  Fact Sheet for Healthcare Providers: IncredibleEmployment.be  This test is not yet approved or cleared by the [place] FDA and has been authorized for detection and/or diagnosis of SARS-CoV-2 by FDA under an Emergency Use Authorization (EUA). This EUA will remain in effect (meaning this test can be used) for the duration of the COVID-19 declaration under Section 564(b)(1) of the Act, 21 U.S.C. section 360bbb-3(b)(1), unless the authorization is terminated or revoked.  Performed at [facility], [place] [address], [place], [place] [address]       Radiology Studies: DG Chest Portable 1 View  Result Date: 06/09/2022 CLINICAL  DATA:  Cough with shortness of breath, diarrhea, body aches and headaches for a few days. History of COPD and diabetes. EXAM: PORTABLE CHEST 1 VIEW COMPARISON:  Radiographs 11/20/2021 and 06/02/2021.  CT 05/22/2022. FINDINGS: 1157 hours. Lordotic positioning. The heart size and mediastinal contours are stable. The lungs are clear. There is no pleural effusion or pneumothorax. No acute osseous findings are identified. Telemetry leads overlie the chest. IMPRESSION: No evidence of active cardiopulmonary process. Electronically Signed   By: Richardean Sale M.D.   On: 06/09/2022 12:06    Scheduled Meds:  amphetamine-dextroamphetamine  30 mg Oral BID   ARIPiprazole  5 mg Oral Daily   atorvastatin  20 mg Oral BID   docusate sodium  100 mg Oral BID   DULoxetine  60 mg Oral Daily   enoxaparin (LOVENOX) injection  60 mg Subcutaneous Q24H   glipiZIDE  5 mg Oral BID AC   insulin aspart  0-15 Units Subcutaneous TID WC   insulin aspart  0-5 Units Subcutaneous QHS   insulin glargine-yfgn  15 Units Subcutaneous Daily   ipratropium-albuterol  3 mL Nebulization TID  methylPREDNISolone (SOLU-MEDROL) injection  60 mg Intravenous Q12H   nicotine  14 mg Transdermal Daily   oseltamivir  75 mg Oral BID   pantoprazole  40 mg Oral BID   pregabalin  150 mg Oral BID   Continuous Infusions:   LOS: 2 days   Time spent: 35 minutes   Vasilia Dise Loann Quill, MD [facility]  If 7PM-7AM, please contact night-coverage [email or web address] 06/11/2022, 10:58 AM

## 2022-06-11 NOTE — Progress Notes (Signed)
Mobility Specialist - Progress Note   06/11/22 1056  Oxygen Therapy  O2 Device Nasal Cannula  O2 Flow Rate (L/min) 2 L/min  Mobility  Activity Ambulated with assistance in hallway  Level of Assistance Standby assist, set-up cues, supervision of patient - no hands on  Assistive Device None  Distance Ambulated (ft) 80 ft  Activity Response Tolerated fair  Mobility Referral Yes  $Mobility charge 1 Mobility   Nurse requested Mobility Specialist to perform oxygen saturation test with pt which includes removing pt from oxygen both at rest and while ambulating.  Below are the results from that testing.     Patient Saturations on Room Air at Rest = spO2 88%. At this point pt was put on 2L of O2. Discontinued test.  Pt proceeded to ambulate in hallway on 2L, desatting to 88% but recovering after 1 minute of pursed lip breathing. Returned to chair with SpO2 at 93% with all needs met.   Roderick Pee Mobility Specialist

## 2022-06-12 DIAGNOSIS — J9601 Acute respiratory failure with hypoxia: Secondary | ICD-10-CM | POA: Diagnosis not present

## 2022-06-12 LAB — CBC
HCT: 45.3 % (ref 36.0–46.0)
Hemoglobin: 13.7 g/dL (ref 12.0–15.0)
MCH: 24 pg — ABNORMAL LOW (ref 26.0–34.0)
MCHC: 30.2 g/dL (ref 30.0–36.0)
MCV: 79.5 fL — ABNORMAL LOW (ref 80.0–100.0)
Platelets: 167 10*3/uL (ref 150–400)
RBC: 5.7 MIL/uL — ABNORMAL HIGH (ref 3.87–5.11)
RDW: 18.6 % — ABNORMAL HIGH (ref 11.5–15.5)
WBC: 14.1 10*3/uL — ABNORMAL HIGH (ref 4.0–10.5)
nRBC: 0 % (ref 0.0–0.2)

## 2022-06-12 LAB — BASIC METABOLIC PANEL
Anion gap: 11 (ref 5–15)
BUN: 19 mg/dL (ref 6–20)
CO2: 29 mmol/L (ref 22–32)
Calcium: 9.3 mg/dL (ref 8.9–10.3)
Chloride: 97 mmol/L — ABNORMAL LOW (ref 98–111)
Creatinine, Ser: 0.85 mg/dL (ref 0.44–1.00)
GFR, Estimated: >60 mL/min (ref >60)
Glucose, Bld: 223 mg/dL — ABNORMAL HIGH (ref 70–99)
Potassium: 4.4 mmol/L (ref 3.5–5.1)
Sodium: 137 mmol/L (ref 135–145)

## 2022-06-12 LAB — GLUCOSE, CAPILLARY
Glucose-Capillary: 263 mg/dL — ABNORMAL HIGH (ref 70–99)
Glucose-Capillary: 195 mg/dL — ABNORMAL HIGH (ref 70–99)
Glucose-Capillary: 153 mg/dL — ABNORMAL HIGH (ref 70–99)
Glucose-Capillary: 113 mg/dL — ABNORMAL HIGH (ref 70–99)

## 2022-06-12 MED ORDER — METOPROLOL TARTRATE 25 MG PO TABS
25.0000 mg | ORAL_TABLET | Freq: Every day | ORAL | Status: DC
  Administered 2022-06-12 – 2022-06-13 (×2): 25 mg via ORAL
  Filled 2022-06-12 (×2): qty 1

## 2022-06-12 MED ORDER — INSULIN GLARGINE-YFGN 100 UNIT/ML ~~LOC~~ SOLN
20.0000 [IU] | Freq: Every day | SUBCUTANEOUS | Status: DC
  Administered 2022-06-13: 20 [IU] via SUBCUTANEOUS
  Filled 2022-06-12: qty 0.2

## 2022-06-12 MED ORDER — HYDRALAZINE HCL 20 MG/ML IJ SOLN: 10.0000 mg | Freq: Four times a day (QID) | INTRAMUSCULAR | Status: DC | PRN

## 2022-06-12 MED ORDER — PREDNISONE 50 MG PO TABS
50.0000 mg | ORAL_TABLET | Freq: Every day | ORAL | Status: DC
  Administered 2022-06-13: 50 mg via ORAL
  Filled 2022-06-12: qty 1

## 2022-06-12 MED ORDER — LOSARTAN POTASSIUM 50 MG PO TABS
25.0000 mg | ORAL_TABLET | Freq: Every day | ORAL | Status: DC
  Administered 2022-06-12 – 2022-06-13 (×2): 25 mg via ORAL
  Filled 2022-06-12 (×2): qty 1

## 2022-06-12 NOTE — Progress Notes (Addendum)
No acute events overnight. BP trends up with pain. POC glucose at hs 263. Pt received her PRN Oxycodone several times through the night. Remains on 1 L per nasal cannula

## 2022-06-12 NOTE — Progress Notes (Signed)
PROGRESS NOTE    Special Gartley  [id number] DOB: April 23, 1966 DOA: 06/09/2022 PCP: Haydee Salter, MD   Brief Narrative:  [name] is a 57 y.o. female with medical history significant for tobacco abuse, COPD on room air, insulin-dependent type 2 diabetes, morbid obesity being admitted to the hospital with acute hypoxic respiratory failure due to COPD exacerbation in the setting of influenza A infection   ED Course: Initial evaluation in the emergency department revealed normal vital signs except for O2 saturation of 78% on room air.  Patient was placed on 2 L nasal cannula oxygen is now saturating 93%.  Lab work shows normal white blood cell count, hemoglobin 15, potassium 3.2, creatinine up to 1.7 from normal baseline in August 2023.  BNP 23, troponin 57, 53.  Patient started on IV Solu-Medrol and Tamiflu admitted for further management of acute hypoxemic respiratory failure in the setting of COPD exacerbation and influenza A virus  Assessment & Plan:   Acute hypoxemic respiratory failure in the setting of COPD exacerbation and influenza A virus: -Patient has underlying COPD.  Noted to have hypoxic upon arrival.  Placed on 2 L of oxygen via nasal cannula. -Flu A positive.  Chest CT negative for infection.  -Continues to have expiratory wheezing, continue Solu-Medrol and Tamiflu -Unable to wean off of oxygen to room air yesterday.  Will try again.  May qualifies for home oxygen -Continue DuoNeb and albuterol as needed. -Encouraged incentive spirometry -Will switch Solu-Medrol to prednisone tomorrow -Consult PT  AKI: -Received IV fluids in the ER. -Kidney function have improved to baseline.  Continue to monitor.  Hypertension: -Blood pressure is elevated due to pain.  Continue Imdur, resumed beta-blocker and losartan -Will continue to monitor  Hypokalemia: Replenished.  Leukocytosis: Likely due to steroids.  Patient remained afebrile.  Type 2 diabetes on  long-term current use of insulin: Diabetic peripheral neuropathy -Well-controlled.  Last A1c 7.1.  On sliding scale insulin, Semglee 15 units daily and glipizide -Blood sugar running high due to steroids.  Will go up on Semglee dose from 15 units to 20 units. -Continue Lyrica for neuropathy  GERD: Continue PPI  Chronic back pain: Continue oxycodone as needed  Major depressive disorder, recurrent, severe without psychotic features: Anxiety: -Home medication Abilify, clonazepam and Cymbalta  ADHD: -Continue Adderall  Tobacco abuse: Counseled about cessation.  Nicotine patch  Morbid obesity with BMI of 47: -Diet modification, exercise and weight loss recommended  Three-vessel coronary atherosclerosis Aortic atherosclerosis: Noted on CT chest on 05/22/2022 -Continue statin.  Follow-up with PCP outpatient  DVT prophylaxis: Lovenox Code Status: Full code Family Communication: None  present at bedside.  Plan of care discussed with patient in length and he verbalized understanding and agreed with it. Disposition Plan: Home in 1 to 2 days  Consultants:  None Procedures:  None  Antimicrobials:  None  Status is: Inpatient    Subjective: Patient seen and examined.  Sitting comfortably on the bed.  Getting breathing treatment.  Reports that feels better but not at baseline yet.  Her blood pressure has been elevated due to back pain.  Remained afebrile.  No acute event overnight.  Objective: Vitals:   06/11/22 1932 06/12/22 0425 06/12/22 0615 06/12/22 0822  BP: (!) 158/117 (!) 187/75 (!) 155/66   Pulse: 81 73 78   Resp: 18 17    Temp: (!) 97.5 F (36.4 C) 97.6 F (36.4 C)    TempSrc: Oral Oral    SpO2: 94% 92%  94%  Weight:      Height:       No intake or output data in the 24 hours ending 06/12/22 1153  Filed Weights   06/09/22 1121  Weight: 124.7 kg    Examination:  General exam: Appears calm and comfortable, on nasal cannula, morbidly obese Respiratory  system: Diffuse expiratory wheezing noted, not using accessory muscles Cardiovascular system: S1 & S2 heard, RRR. No JVD, murmurs, rubs, gallops or clicks. No pedal edema. Gastrointestinal system: Abdomen is nondistended, soft and nontender. No organomegaly or masses felt. Normal bowel sounds heard. Central nervous system: Alert and oriented. No focal neurological deficits. Extremities: Symmetric 5 x 5 power. Skin: No rashes, lesions or ulcers Psychiatry: Judgement and insight appear normal. Mood & affect appropriate.    Data Reviewed: I have personally reviewed following labs and imaging studies  CBC: Recent Labs  Lab 06/09/22 1218 06/10/22 0756 06/11/22 0527 06/12/22 0443  WBC 6.9 6.2 14.1* 14.1*  NEUTROABS 4.8  --   --   --   HGB 15.4* 14.6 13.9 13.7  HCT 49.7* 47.6* 45.0 45.3  MCV 78.3* 79.1* 78.3* 79.5*  PLT 211 183 184 [id number]    Basic Metabolic Panel: Recent Labs  Lab 06/09/22 1218 06/10/22 0756 06/11/22 0527 06/12/22 0443  NA 134* 133* 134* 137  K 3.2* 4.5 4.2 4.4  CL 95* 96* 95* 97*  CO2 24 23 27 29  $ GLUCOSE 143* 164* 232* 223*  BUN 19 25* 33* 19  CREATININE 1.71* 1.04* 1.03* 0.85  CALCIUM 8.9 8.9 9.1 9.3  MG  --   --  2.8*  --     GFR: Estimated Creatinine Clearance: 96.5 mL/min (by C-G formula based on SCr of 0.85 mg/dL). Liver Function Tests: Recent Labs  Lab 06/09/22 1218  AST 46*  ALT 29  ALKPHOS 59  BILITOT 0.6  PROT 7.7  ALBUMIN 3.9    No results for input(s): "LIPASE", "AMYLASE" in the last 168 hours. No results for input(s): "AMMONIA" in the last 168 hours. Coagulation Profile: No results for input(s): "INR", "PROTIME" in the last 168 hours. Cardiac Enzymes: No results for input(s): "CKTOTAL", "CKMB", "CKMBINDEX", "TROPONINI" in the last 168 hours. BNP (last 3 results) No results for input(s): "PROBNP" in the last 8760 hours. HbA1C: No results for input(s): "HGBA1C" in the last 72 hours. CBG: Recent Labs  Lab 06/11/22 1201  06/11/22 1654 06/11/22 2127 06/12/22 0709 06/12/22 1121  GLUCAP 268* 228* 263* 263* 195*    Lipid Profile: No results for input(s): "CHOL", "HDL", "LDLCALC", "TRIG", "CHOLHDL", "LDLDIRECT" in the last 72 hours. Thyroid Function Tests: No results for input(s): "TSH", "T4TOTAL", "FREET4", "T3FREE", "THYROIDAB" in the last 72 hours. Anemia Panel: No results for input(s): "VITAMINB12", "FOLATE", "FERRITIN", "TIBC", "IRON", "RETICCTPCT" in the last 72 hours. Sepsis Labs: No results for input(s): "PROCALCITON", "LATICACIDVEN" in the last 168 hours.  Recent Results (from the past 240 hour(s))  Resp panel by RT-PCR (RSV, Flu A&B, Covid) Anterior Nasal Swab     Status: Abnormal   Collection Time: 06/09/22 12:18 PM   Specimen: Anterior Nasal Swab  Result Value Ref Range Status   SARS Coronavirus 2 by RT PCR NEGATIVE NEGATIVE Final    Comment: (NOTE) SARS-CoV-2 target nucleic acids are NOT DETECTED.  The SARS-CoV-2 RNA is generally detectable in upper respiratory specimens during the acute phase of infection. The lowest concentration of SARS-CoV-2 viral copies this assay can detect is 138 copies/mL. A negative result does not preclude SARS-Cov-2 infection and should not be  used as the sole basis for treatment or other patient management decisions. A negative result may occur with  improper specimen collection/handling, submission of specimen other than nasopharyngeal swab, presence of viral mutation(s) within the areas targeted by this assay, and inadequate number of viral copies(<138 copies/mL). A negative result must be combined with clinical observations, patient history, and epidemiological information. The expected result is Negative.  Fact Sheet for Patients:  EntrepreneurPulse.com.au  Fact Sheet for Healthcare Providers:  IncredibleEmployment.be  This test is no t yet approved or cleared by the [place] FDA and  has been authorized for  detection and/or diagnosis of SARS-CoV-2 by FDA under an Emergency Use Authorization (EUA). This EUA will remain  in effect (meaning this test can be used) for the duration of the COVID-19 declaration under Section 564(b)(1) of the Act, 21 U.S.C.section 360bbb-3(b)(1), unless the authorization is terminated  or revoked sooner.       Influenza A by PCR POSITIVE (A) NEGATIVE Final   Influenza B by PCR NEGATIVE NEGATIVE Final    Comment: (NOTE) The Xpert Xpress SARS-CoV-2/FLU/RSV plus assay is intended as an aid in the diagnosis of influenza from Nasopharyngeal swab specimens and should not be used as a sole basis for treatment. Nasal washings and aspirates are unacceptable for Xpert Xpress SARS-CoV-2/FLU/RSV testing.  Fact Sheet for Patients: EntrepreneurPulse.com.au  Fact Sheet for Healthcare Providers: IncredibleEmployment.be  This test is not yet approved or cleared by the [place] FDA and has been authorized for detection and/or diagnosis of SARS-CoV-2 by FDA under an Emergency Use Authorization (EUA). This EUA will remain in effect (meaning this test can be used) for the duration of the COVID-19 declaration under Section 564(b)(1) of the Act, 21 U.S.C. section 360bbb-3(b)(1), unless the authorization is terminated or revoked.     Resp Syncytial Virus by PCR NEGATIVE NEGATIVE Final    Comment: (NOTE) Fact Sheet for Patients: EntrepreneurPulse.com.au  Fact Sheet for Healthcare Providers: IncredibleEmployment.be  This test is not yet approved or cleared by the [place] FDA and has been authorized for detection and/or diagnosis of SARS-CoV-2 by FDA under an Emergency Use Authorization (EUA). This EUA will remain in effect (meaning this test can be used) for the duration of the COVID-19 declaration under Section 564(b)(1) of the Act, 21 U.S.C. section 360bbb-3(b)(1), unless the authorization is  terminated or revoked.  Performed at [facility], [place] [address] [place], [place] [address]       Radiology Studies: No results found.  Scheduled Meds:  amphetamine-dextroamphetamine  30 mg Oral BID   ARIPiprazole  5 mg Oral Daily   atorvastatin  20 mg Oral BID   docusate sodium  100 mg Oral BID   DULoxetine  60 mg Oral Daily   enoxaparin (LOVENOX) injection  60 mg Subcutaneous Q24H   glipiZIDE  5 mg Oral BID AC   insulin aspart  0-15 Units Subcutaneous TID WC   insulin aspart  0-5 Units Subcutaneous QHS   insulin glargine-yfgn  15 Units Subcutaneous Daily   ipratropium-albuterol  3 mL Nebulization TID   losartan  25 mg Oral Daily   metoprolol tartrate  25 mg Oral Daily   nicotine  14 mg Transdermal Daily   oseltamivir  75 mg Oral BID   pantoprazole  40 mg Oral BID   [START ON 06/13/2022] predniSONE  50 mg Oral Q breakfast   pregabalin  150 mg Oral BID   Continuous Infusions:   LOS: 3 days   Time spent:  35 minutes   Lucill Mauck Loann Quill, MD [facility]  If 7PM-7AM, please contact night-coverage [email or web address] 06/12/2022, 11:53 AM

## 2022-06-12 NOTE — Evaluation (Signed)
Physical Therapy Evaluation-1x Patient Details Name: [name] MRN: [id number] DOB: 1965-10-15 Today's Date: 06/12/2022  History of Present Illness  57 yo female admitted with acute respiratory failure, COPD exac. Hx of COPD, DM, obesity, chronic back pain  Clinical Impression  On eval, pt was Ind with mobility. She walked ~120 feet around the unit. No LOB. Dyspnea 2/4. O2 93% on RA at rest, 88-90% on RA with ambulation. Encouraged pt to continue breathing exercises with spirometer. No acute PT needs. 1x eval. Will sign off.        Recommendations for follow up therapy are one component of a multi-disciplinary discharge planning process, led by the attending physician.  Recommendations may be updated based on patient status, additional functional criteria and insurance authorization.  Follow Up Recommendations No PT follow up      Assistance Recommended at Discharge None  Patient can return home with the following       Equipment Recommendations None recommended by PT  Recommendations for Other Services       Functional Status Assessment Patient has had a recent decline in their functional status and demonstrates the ability to make significant improvements in function in a reasonable and predictable amount of time.     Precautions / Restrictions Precautions Precaution Comments: monitor O2 Restrictions Weight Bearing Restrictions: No      Mobility  Bed Mobility               General bed mobility comments: oob in recliner    Transfers Overall transfer level: Independent                      Ambulation/Gait Ambulation/Gait assistance: Independent Gait Distance (Feet): 120 Feet Assistive device: None Gait Pattern/deviations: Step-through pattern       General Gait Details: O2 88-90% on RA, dyspnea 2/4. Tolerated distance well.  Stairs            Wheelchair Mobility    Modified Rankin (Stroke Patients Only)       Balance  Overall balance assessment: No apparent balance deficits (not formally assessed)                                           Pertinent Vitals/Pain Pain Assessment Pain Assessment: Faces Pain Location: back-chronic Pain Descriptors / Indicators: Discomfort, Sore Pain Intervention(s): Monitored during session    Home Living Family/patient expects to be discharged to:: Private residence Living Arrangements: Spouse/significant other Available Help at Discharge: Family Type of Home: Mobile home Home Access: Stairs to enter   Technical brewer of Steps: 6   Home Layout: One level Home Equipment: None      Prior Function Prior Level of Function : Independent/Modified Independent                     Hand Dominance        Extremity/Trunk Assessment   Upper Extremity Assessment Upper Extremity Assessment: Overall WFL for tasks assessed    Lower Extremity Assessment Lower Extremity Assessment: Overall WFL for tasks assessed    Cervical / Trunk Assessment Cervical / Trunk Assessment: Normal  Communication   Communication: No difficulties  Cognition Arousal/Alertness: Awake/alert Behavior During Therapy: WFL for tasks assessed/performed Overall Cognitive Status: Within Functional Limits for tasks assessed  General Comments      Exercises     Assessment/Plan    PT Assessment Patient does not need any further PT services  PT Problem List         PT Treatment Interventions      PT Goals (Current goals can be found in the Care Plan section)  Acute Rehab PT Goals Patient Stated Goal: home soon PT Goal Formulation: All assessment and education complete, DC therapy    Frequency       Co-evaluation               AM-PAC PT "6 Clicks" Mobility  Outcome Measure Help needed turning from your back to your side while in a flat bed without using bedrails?: None Help needed  moving from lying on your back to sitting on the side of a flat bed without using bedrails?: None Help needed moving to and from a bed to a chair (including a wheelchair)?: None Help needed standing up from a chair using your arms (e.g., wheelchair or bedside chair)?: None Help needed to walk in hospital room?: None Help needed climbing 3-5 steps with a railing? : None 6 Click Score: 24    End of Session   Activity Tolerance: Patient tolerated treatment well Patient left: in chair;with call bell/phone within reach        Time: 1130-1143 PT Time Calculation (min) (ACUTE ONLY): 13 min   Charges:   PT Evaluation $PT Eval Low Complexity: [place], PT Acute Rehabilitation  Office: [phone]

## 2022-06-13 DIAGNOSIS — J9601 Acute respiratory failure with hypoxia: Secondary | ICD-10-CM | POA: Diagnosis not present

## 2022-06-13 LAB — CBC
HCT: 43 % (ref 36.0–46.0)
Hemoglobin: 13.1 g/dL (ref 12.0–15.0)
MCH: 24.3 pg — ABNORMAL LOW (ref 26.0–34.0)
MCHC: 30.5 g/dL (ref 30.0–36.0)
MCV: 79.6 fL — ABNORMAL LOW (ref 80.0–100.0)
Platelets: 145 10*3/uL — ABNORMAL LOW (ref 150–400)
RBC: 5.4 MIL/uL — ABNORMAL HIGH (ref 3.87–5.11)
RDW: 18.1 % — ABNORMAL HIGH (ref 11.5–15.5)
WBC: 10.5 10*3/uL (ref 4.0–10.5)
nRBC: 0 % (ref 0.0–0.2)

## 2022-06-13 LAB — GLUCOSE, CAPILLARY
Glucose-Capillary: 75 mg/dL (ref 70–99)
Glucose-Capillary: 133 mg/dL — ABNORMAL HIGH (ref 70–99)

## 2022-06-13 LAB — BASIC METABOLIC PANEL
Anion gap: 9 (ref 5–15)
BUN: 15 mg/dL (ref 6–20)
CO2: 31 mmol/L (ref 22–32)
Calcium: 8.7 mg/dL — ABNORMAL LOW (ref 8.9–10.3)
Chloride: 98 mmol/L (ref 98–111)
Creatinine, Ser: 0.72 mg/dL (ref 0.44–1.00)
GFR, Estimated: >60 mL/min (ref >60)
Glucose, Bld: 74 mg/dL (ref 70–99)
Potassium: 3.1 mmol/L — ABNORMAL LOW (ref 3.5–5.1)
Sodium: 138 mmol/L (ref 135–145)

## 2022-06-13 MED ORDER — PREDNISONE 10 MG PO TABS: ORAL_TABLET | ORAL | 0 refills | Status: DC

## 2022-06-13 MED ORDER — AZITHROMYCIN 250 MG PO TABS: ORAL_TABLET | ORAL | 0 refills | Status: AC

## 2022-06-13 MED ORDER — ALBUTEROL SULFATE (2.5 MG/3ML) 0.083% IN NEBU: 2.5000 mg | INHALATION_SOLUTION | Freq: Four times a day (QID) | RESPIRATORY_TRACT | 0 refills | Status: DC | PRN

## 2022-06-13 MED ORDER — OSELTAMIVIR PHOSPHATE 75 MG PO CAPS: 75.0000 mg | ORAL_CAPSULE | Freq: Two times a day (BID) | ORAL | 0 refills | Status: AC

## 2022-06-13 NOTE — Progress Notes (Signed)
SATURATION QUALIFICATIONS: (This note is used to comply with regulatory documentation for home oxygen)  Patient Saturations on Room Air at Rest = 94%  Patient Saturations on Room Air while Ambulating = 92%  Patient Saturations on 0 Liters of oxygen while Ambulating = 91-94%

## 2022-06-13 NOTE — Discharge Summary (Signed)
Discharge Summary  [name] [id number] DOB: [birth year]  PCP: Haydee Salter, MD  Admit date: 06/09/2022 Discharge date: 06/13/2022  Time spent: 35 minutes.  Recommendations for Outpatient Follow-up:  Follow-up with your primary care provider. Completely abstain from tobacco use.  Discharge Diagnoses:  Active Hospital Problems   Diagnosis Date Noted   Acute respiratory failure with hypoxia ([place]) 06/02/2021   Morbid obesity with BMI of 45.0-49.9, adult ([place]) 05/06/2011    Priority: Low   AKI (acute kidney injury) ([place]) 06/09/2022   Tobacco use disorder 10/01/2017   Influenza A 05/07/2012   Essential hypertension 12/02/2010   Type 2 diabetes mellitus with polyneuropathy ([place]) 12/02/2010   COPD with acute exacerbation ([place]) 11/30/2010   GERD (gastroesophageal reflux disease) 09/08/2010    Resolved Hospital Problems  No resolved problems to display.    Discharge Condition: Stable  Diet recommendation: Resume previous diet.  Vitals:   06/13/22 0518 06/13/22 0753  BP: (!) 141/68   Pulse: 81   Resp: 17   Temp: 98.2 F (36.8 C)   SpO2: 94% 92%    History of present illness:  [name] is a 57 y.o. female with medical history significant for tobacco abuse, COPD on room air, insulin-dependent type 2 diabetes, morbid obesity being admitted to the hospital with acute hypoxic respiratory failure due to COPD exacerbation in the setting of influenza A infection    ED Course: Initial evaluation in the emergency department revealed normal vital signs except for O2 saturation of 78% on room air.  Patient was placed on 2 L nasal cannula oxygen is now saturating 93%.  Lab work shows normal white blood cell count, hemoglobin 15, potassium 3.2, creatinine up to 1.7 from normal baseline in August 2023.  BNP 23, troponin 57, 53.  Patient started on IV Solu-Medrol and Tamiflu admitted for further management of acute hypoxemic respiratory failure in the setting  of COPD exacerbation and influenza A virus.  Treated with Tamiflu, bronchodilators, IV steroids, weaned off oxygen supplementation.  06/13/2022: The patient was seen and examined at bedside.  She has no new complaints.  There were no acute events overnight.  She passed her home O2 evaluation.  O2 saturation on room air while ambulating 92%.  Hospital Course:  Principal Problem:   Acute respiratory failure with hypoxia (HCC) Active Problems:   Morbid obesity with BMI of 45.0-49.9, adult (HCC)   GERD (gastroesophageal reflux disease)   COPD with acute exacerbation (HCC)   Essential hypertension   Type 2 diabetes mellitus with polyneuropathy (HCC)   Influenza A   Tobacco use disorder   AKI (acute kidney injury) ([place])  Resolved acute hypoxemic respiratory failure in the setting of COPD exacerbation and influenza A virus: Completed Tamiflu Z-Pak As needed nebulizer Taper prednisone   Resolved AKI: Avoid nephrotoxic agents Follow-up with your PCP  Tobacco use disorder Counseled on the importance of complete tobacco cessation.  Obesity Recommend weight loss outpatient regular physical activity and healthy dieting.  Type 2 diabetes Resume home regimen Follow-up with PCP  Procedures: None  Consultations: None  Discharge Exam: BP (!) 141/68 (BP Location: Left Arm)   Pulse 81   Temp 98.2 F (36.8 C) (Oral)   Resp 17   Ht 5' 4"$  (1.626 m)   Wt 124.7 kg   LMP 07/24/2014 Comment: pt has had ablation, but still has some periods  SpO2 92%   BMI 47.20 kg/m  General: 57 y.o. year-old female well developed well nourished in no  acute distress.  Alert and oriented x3. Cardiovascular: Regular rate and rhythm with no rubs or gallops.  No thyromegaly or JVD noted.   Respiratory: Clear to auscultation with no wheezes or rales. Good inspiratory effort. Abdomen: Soft nontender nondistended with normal bowel sounds x4 quadrants. Musculoskeletal: No lower extremity edema. 2/4 pulses in  all 4 extremities. Skin: No ulcerative lesions noted or rashes, Psychiatry: Mood is appropriate for condition and setting  Discharge Instructions You were cared for by a hospitalist during your hospital stay. If you have any questions about your discharge medications or the care you received while you were in the hospital after you are discharged, you can call the unit and asked to speak with the hospitalist on call if the hospitalist that took care of you is not available. Once you are discharged, your primary care physician will handle any further medical issues. Please note that NO REFILLS for any discharge medications will be authorized once you are discharged, as it is imperative that you return to your primary care physician (or establish a relationship with a primary care physician if you do not have one) for your aftercare needs so that they can reassess your need for medications and monitor your lab values.   Allergies as of 06/13/2022       Reactions   Codeine Itching, Nausea Only   Metformin And Related Diarrhea, Nausea Only, Other (See Comments)   Stomach pain/nausea   Wellbutrin [bupropion Hcl] Hives   Acyclovir And Related Rash        Medication List     STOP taking these medications    furosemide 40 MG tablet Commonly known as: LASIX   isosorbide mononitrate 30 MG 24 hr tablet Commonly known as: IMDUR   meloxicam 15 MG tablet Commonly known as: MOBIC       TAKE these medications    Accu-Chek Guide test strip Generic drug: glucose blood USE UP TO FOUR TIMES DAILY AS DIRECTED   Accu-Chek Softclix Lancets lancets USE UP TO FOUR TIMES DAILY AS DIRECTED   albuterol 108 (90 Base) MCG/ACT inhaler Commonly known as: VENTOLIN HFA Inhale 2 puffs into the lungs every 6 (six) hours as needed for wheezing or shortness of breath.   albuterol (2.5 MG/3ML) 0.083% nebulizer solution Commonly known as: PROVENTIL Take 3 mLs (2.5 mg total) by nebulization every 6 (six)  hours as needed for wheezing or shortness of breath.   amphetamine-dextroamphetamine 30 MG tablet Commonly known as: Adderall Take 1 tablet by mouth 2 (two) times daily. What changed: Another medication with the same name was removed. Continue taking this medication, and follow the directions you see here.   ARIPiprazole 5 MG tablet Commonly known as: ABILIFY Take 1 tablet (5 mg total) by mouth daily.   atorvastatin 40 MG tablet Commonly known as: LIPITOR Take 0.5 tablets (20 mg total) by mouth 2 (two) times daily. What changed:  how much to take when to take this   azithromycin 250 MG tablet Commonly known as: Zithromax Z-Pak Take 2 tablets (500 mg) on  Day 1,  followed by 1 tablet (250 mg) once daily on Days 2 through 5.   BD Pen Needle Nano 2nd Gen 32G X 4 MM Misc Generic drug: Insulin Pen Needle USE DAILY WITH VICTOZA   bismuth subsalicylate [id number] suspension Commonly known as: PEPTO BISMOL Take 30 mLs by mouth every 6 (six) hours as needed for indigestion.   blood glucose meter kit and supplies Dispense based on patient  and insurance preference. Use up to four times daily as directed. (FOR ICD-10 E10.9, E11.9).   clonazePAM 0.5 MG tablet Commonly known as: KLONOPIN Take 1 tablet (0.5 mg total) by mouth 2 (two) times daily as needed for anxiety.   DULoxetine 60 MG capsule Commonly known as: CYMBALTA TAKE 1 CAPSULE BY MOUTH EVERY DAY What changed:  how much to take when to take this   Farxiga 10 MG Tabs tablet Generic drug: dapagliflozin propanediol TAKE 1 TABLET(10 MG) BY MOUTH DAILY What changed: See the new instructions.   fluticasone 50 MCG/ACT nasal spray Commonly known as: FLONASE PLACE 1 SPRAY INTO BOTH NOSTRILS 2 (TWO) TIMES DAILY AS NEEDED FOR ALLERGIES OR RHINITIS (AFTER SINUS RINSE). What changed: when to take this   glipiZIDE 5 MG tablet Commonly known as: GLUCOTROL TAKE 1 TABLET(5 MG) BY MOUTH TWICE DAILY BEFORE A MEAL What changed: See  the new instructions.   ibuprofen 200 MG tablet Commonly known as: ADVIL Take 600 mg by mouth every 6 (six) hours as needed for mild pain or headache.   insulin glargine 100 UNIT/ML injection Commonly known as: LANTUS INJECT 0.15 MLS (15 UNITS TOTAL) INTO THE SKIN DAILY. What changed:  how much to take how to take this when to take this additional instructions   INSULIN SYRINGE .5CC/31GX5/16" 31G X 5/16" 0.5 ML Misc USE AS DIRECTED ONCE DAILY   ipratropium-albuterol 0.5-2.5 (3) MG/3ML Soln Commonly known as: DUONEB Take 3 mLs by nebulization 3 (three) times daily.   losartan 25 MG tablet Commonly known as: COZAAR Take 1 tablet (25 mg total) by mouth daily. TAKE 1 TABLET(25 MG) BY MOUTH DAILY Strength: 25 mg What changed:  when to take this additional instructions   metoprolol tartrate 25 MG tablet Commonly known as: LOPRESSOR TAKE 1 TABLET(25 MG) BY MOUTH TWICE DAILY What changed: See the new instructions.   Mucinex DM 30-600 MG Tb12 Take 1 tablet by mouth every 12 (twelve) hours as needed (for coughing or to loosen mucus).   NICODERM CQ TD Place 1 patch onto the skin daily as needed (FOR SMOKING CESSATION WHILE HOSPITALIZED).   nystatin powder Commonly known as: MYCOSTATIN/NYSTOP Apply 1 application topically 2 (two) times daily. What changed:  when to take this reasons to take this   oseltamivir 75 MG capsule Commonly known as: TAMIFLU Take 1 capsule (75 mg total) by mouth 2 (two) times daily for 4 days.   pantoprazole 40 MG tablet Commonly known as: PROTONIX TAKE 1 TABLET BY MOUTH TWICE A DAY What changed: when to take this   predniSONE 10 MG tablet Commonly known as: DELTASONE Take 40 mg daily x 2 days, then  Take 30 mg daily x 2 days, then Take 20 mg daily x 2 days, then Take 10 mg daily x 2 days, then stop. Start taking on: June 14, 2022   pregabalin 150 MG capsule Commonly known as: LYRICA TAKE 1 CAPSULE BY MOUTH TWICE A DAY   Semaglutide  (2 MG/DOSE) 8 MG/3ML Sopn Inject 2 mg as directed once a week. What changed: when to take this       Allergies  Allergen Reactions   Codeine Itching and Nausea Only   Metformin And Related Diarrhea, Nausea Only and Other (See Comments)    Stomach pain/nausea   Wellbutrin [Bupropion Hcl] Hives   Acyclovir And Related Rash      The results of significant diagnostics from this hospitalization (including imaging, microbiology, ancillary and laboratory) are listed below for reference.  Significant Diagnostic Studies: DG Chest Portable 1 View  Result Date: 06/09/2022 CLINICAL DATA:  Cough with shortness of breath, diarrhea, body aches and headaches for a few days. History of COPD and diabetes. EXAM: PORTABLE CHEST 1 VIEW COMPARISON:  Radiographs 11/20/2021 and 06/02/2021.  CT 05/22/2022. FINDINGS: 1157 hours. Lordotic positioning. The heart size and mediastinal contours are stable. The lungs are clear. There is no pleural effusion or pneumothorax. No acute osseous findings are identified. Telemetry leads overlie the chest. IMPRESSION: No evidence of active cardiopulmonary process. Electronically Signed   By: Richardean Sale M.D.   On: 06/09/2022 12:06   CT CHEST LUNG CA SCREEN LOW DOSE W/O CM  Result Date: 05/23/2022 CLINICAL DATA:  57 year old female current smoker with 60 pack-year smoking history. EXAM: CT CHEST WITHOUT CONTRAST LOW-DOSE FOR LUNG CANCER SCREENING TECHNIQUE: Multidetector CT imaging of the chest was performed following the standard protocol without IV contrast. RADIATION DOSE REDUCTION: This exam was performed according to the departmental dose-optimization program which includes automated exposure control, adjustment of the mA and/or kV according to patient size and/or use of iterative reconstruction technique. COMPARISON:  11/20/2021 chest radiograph. 05/13/2006 chest CT (images only). FINDINGS: Cardiovascular: Normal heart size. No significant pericardial  effusion/thickening. Three-vessel coronary atherosclerosis. Atherosclerotic nonaneurysmal thoracic aorta. Normal caliber pulmonary arteries. Mediastinum/Nodes: Coarsely calcified 0.9 cm inferior left thyroid nodule. Not clinically significant; no follow-up imaging recommended (ref: J Am Coll Radiol. 2015 Feb;12(2): 143-50). Unremarkable esophagus. No pathologically enlarged axillary, mediastinal or hilar lymph nodes, noting limited sensitivity for the detection of hilar adenopathy on this noncontrast study. Lungs/Pleura: No pneumothorax. No pleural effusion. Mild centrilobular emphysema with diffuse bronchial wall thickening. No acute consolidative airspace disease or lung masses. Two scattered small solid right lower lobe pulmonary nodules, largest 3.8 mm in volume derived mean diameter (series 9/image 230), both stable since 2008 CT and considered benign. No new significant pulmonary nodules. Upper abdomen: Cholecystectomy. Musculoskeletal: No aggressive appearing focal osseous lesions. Moderate thoracic spondylosis. IMPRESSION: 1. Lung-RADS 2, benign appearance or behavior. Continue annual screening with low-dose chest CT without contrast in 12 months. 2. Three-vessel coronary atherosclerosis. 3. Aortic Atherosclerosis (ICD10-I70.0) and Emphysema (ICD10-J43.9). Electronically Signed   By: Ilona Sorrel M.D.   On: 05/23/2022 12:23    Microbiology: Recent Results (from the past 240 hour(s))  Resp panel by RT-PCR (RSV, Flu A&B, Covid) Anterior Nasal Swab     Status: Abnormal   Collection Time: 06/09/22 12:18 PM   Specimen: Anterior Nasal Swab  Result Value Ref Range Status   SARS Coronavirus 2 by RT PCR NEGATIVE NEGATIVE Final    Comment: (NOTE) SARS-CoV-2 target nucleic acids are NOT DETECTED.  The SARS-CoV-2 RNA is generally detectable in upper respiratory specimens during the acute phase of infection. The lowest concentration of SARS-CoV-2 viral copies this assay can detect is 138 copies/mL. A  negative result does not preclude SARS-Cov-2 infection and should not be used as the sole basis for treatment or other patient management decisions. A negative result may occur with  improper specimen collection/handling, submission of specimen other than nasopharyngeal swab, presence of viral mutation(s) within the areas targeted by this assay, and inadequate number of viral copies(<138 copies/mL). A negative result must be combined with clinical observations, patient history, and epidemiological information. The expected result is Negative.  Fact Sheet for Patients:  EntrepreneurPulse.com.au  Fact Sheet for Healthcare Providers:  IncredibleEmployment.be  This test is no t yet approved or cleared by the [place] and  has been authorized for  detection and/or diagnosis of SARS-CoV-2 by FDA under an Emergency Use Authorization (EUA). This EUA will remain  in effect (meaning this test can be used) for the duration of the COVID-19 declaration under Section 564(b)(1) of the Act, 21 U.S.C.section 360bbb-3(b)(1), unless the authorization is terminated  or revoked sooner.       Influenza A by PCR POSITIVE (A) NEGATIVE Final   Influenza B by PCR NEGATIVE NEGATIVE Final    Comment: (NOTE) The Xpert Xpress SARS-CoV-2/FLU/RSV plus assay is intended as an aid in the diagnosis of influenza from Nasopharyngeal swab specimens and should not be used as a sole basis for treatment. Nasal washings and aspirates are unacceptable for Xpert Xpress SARS-CoV-2/FLU/RSV testing.  Fact Sheet for Patients: EntrepreneurPulse.com.au  Fact Sheet for Healthcare Providers: IncredibleEmployment.be  This test is not yet approved or cleared by the [place] FDA and has been authorized for detection and/or diagnosis of SARS-CoV-2 by FDA under an Emergency Use Authorization (EUA). This EUA will remain in effect (meaning this test  can be used) for the duration of the COVID-19 declaration under Section 564(b)(1) of the Act, 21 U.S.C. section 360bbb-3(b)(1), unless the authorization is terminated or revoked.     Resp Syncytial Virus by PCR NEGATIVE NEGATIVE Final    Comment: (NOTE) Fact Sheet for Patients: EntrepreneurPulse.com.au  Fact Sheet for Healthcare Providers: IncredibleEmployment.be  This test is not yet approved or cleared by the [place] FDA and has been authorized for detection and/or diagnosis of SARS-CoV-2 by FDA under an Emergency Use Authorization (EUA). This EUA will remain in effect (meaning this test can be used) for the duration of the COVID-19 declaration under Section 564(b)(1) of the Act, 21 U.S.C. section 360bbb-3(b)(1), unless the authorization is terminated or revoked.  Performed at [facility], [place] [address] [place], [place] [address]      Labs: Basic Metabolic Panel: Recent Labs  Lab 06/09/22 1218 06/10/22 0756 06/11/22 0527 06/12/22 0443 06/13/22 0500  NA 134* 133* 134* 137 138  K 3.2* 4.5 4.2 4.4 3.1*  CL 95* 96* 95* 97* 98  CO2 24 23 27 29 31  $ GLUCOSE 143* 164* 232* 223* 74  BUN 19 25* 33* 19 15  CREATININE 1.71* 1.04* 1.03* 0.85 0.72  CALCIUM 8.9 8.9 9.1 9.3 8.7*  MG  --   --  2.8*  --   --    Liver Function Tests: Recent Labs  Lab 06/09/22 1218  AST 46*  ALT 29  ALKPHOS 59  BILITOT 0.6  PROT 7.7  ALBUMIN 3.9   No results for input(s): "LIPASE", "AMYLASE" in the last 168 hours. No results for input(s): "AMMONIA" in the last 168 hours. CBC: Recent Labs  Lab 06/09/22 1218 06/10/22 0756 06/11/22 0527 06/12/22 0443 06/13/22 0500  WBC 6.9 6.2 14.1* 14.1* 10.5  NEUTROABS 4.8  --   --   --   --   HGB 15.4* 14.6 13.9 13.7 13.1  HCT 49.7* 47.6* 45.0 45.3 43.0  MCV 78.3* 79.1* 78.3* 79.5* 79.6*  PLT 211 183 184 167 145*   Cardiac Enzymes: No results for input(s): "CKTOTAL", "CKMB",  "CKMBINDEX", "TROPONINI" in the last 168 hours. BNP: BNP (last 3 results) Recent Labs    06/09/22 1218  BNP 23.5    ProBNP (last 3 results) No results for input(s): "PROBNP" in the last 8760 hours.  CBG: Recent Labs  Lab 06/12/22 0709 06/12/22 1121 06/12/22 1625 06/12/22 2024 06/13/22 0726  GLUCAP 263* 195* 153* 113* 75  Signed:  Kayleen Memos, MD [facility] 06/13/2022, 10:01 AM

## 2022-06-14 ENCOUNTER — Telehealth: Payer: Self-pay

## 2022-06-14 NOTE — Transitions of Care (Post Inpatient/ED Visit) (Signed)
   06/14/2022  Name: [name] MRN: [id number] DOB: 07-May-1965  Today's TOC FU Call Status: Today's TOC FU Call Status:: Successful TOC FU Call Competed TOC FU Call Complete Date: 06/14/22  Transition Care Management Follow-up Telephone Call Date of Discharge: 06/13/22 Discharge Facility: [facility]) Type of Discharge: Inpatient Admission Primary Inpatient Discharge Diagnosis:: Acute Respiratory Failure with hypoxia; Influenza A How have you been since you were released from the hospital?: Better Any questions or concerns?: No  Items Reviewed: Did you receive and understand the discharge instructions provided?: Yes Medications obtained and verified?: Yes (Medications Reviewed) Any new allergies since your discharge?: No Dietary orders reviewed?: No Do you have support at home?: Yes People in Home: spouse Name of Support/Comfort Primary Source: Sherren Mocha, [place] and Equipment/Supplies: [place] Ordered?: No Any new equipment or medical supplies ordered?: No  Functional Questionnaire: Do you need assistance with bathing/showering or dressing?: No Do you need assistance with meal preparation?: No Do you need assistance with eating?: No Do you have difficulty maintaining continence: No Do you need assistance with getting out of bed/getting out of a chair/moving?: No Do you have difficulty managing or taking your medications?: No  Folllow up appointments reviewed: PCP Follow-up appointment confirmed?: Yes Date of PCP follow-up appointment?: 06/20/22 Follow-up Provider: Dr. Gena Fray Specialist [facility] Follow-up appointment confirmed?: NA Do you need transportation to your follow-up appointment?: No Do you understand care options if your condition(s) worsen?: Yes-patient verbalized understanding  SDOH Interventions Today    Flowsheet Row Most Recent Value  SDOH Interventions   Food Insecurity Interventions Intervention Not Indicated   Housing Interventions Intervention Not Indicated      Johnney Killian, RN, BSN, CCM Care Management Coordinator [facility] Phone: [phone]: [phone]

## 2022-06-16 ENCOUNTER — Other Ambulatory Visit: Payer: Self-pay | Admitting: Family Medicine

## 2022-06-16 DIAGNOSIS — E782 Mixed hyperlipidemia: Secondary | ICD-10-CM

## 2022-06-16 DIAGNOSIS — I10 Essential (primary) hypertension: Secondary | ICD-10-CM

## 2022-06-19 ENCOUNTER — Telehealth: Payer: Self-pay | Admitting: Family Medicine

## 2022-06-19 NOTE — Telephone Encounter (Signed)
Called patient to schedule Medicare Annual Wellness Visit (AWV). Left message for patient to call back and schedule Medicare Annual Wellness Visit (AWV).  Last date of AWV: 10/30/19  Please schedule an appointment at any time with [facility] .  If any questions, please contact me at [phone].  Thank you ,  Barkley Boards AWV direct phone # [phone]

## 2022-06-20 ENCOUNTER — Ambulatory Visit (INDEPENDENT_AMBULATORY_CARE_PROVIDER_SITE_OTHER): Payer: Medicare HMO | Admitting: Family Medicine

## 2022-06-20 ENCOUNTER — Encounter: Payer: Self-pay | Admitting: Family Medicine

## 2022-06-20 VITALS — BP 124/70 | HR 87 | Temp 97.3°F | Ht 64.0 in | Wt 272.4 lb

## 2022-06-20 DIAGNOSIS — J4489 Other specified chronic obstructive pulmonary disease: Secondary | ICD-10-CM

## 2022-06-20 DIAGNOSIS — F17200 Nicotine dependence, unspecified, uncomplicated: Secondary | ICD-10-CM | POA: Diagnosis not present

## 2022-06-20 DIAGNOSIS — I10 Essential (primary) hypertension: Secondary | ICD-10-CM | POA: Diagnosis not present

## 2022-06-20 DIAGNOSIS — R69 Illness, unspecified: Secondary | ICD-10-CM | POA: Diagnosis not present

## 2022-06-20 DIAGNOSIS — E1142 Type 2 diabetes mellitus with diabetic polyneuropathy: Secondary | ICD-10-CM

## 2022-06-20 LAB — BASIC METABOLIC PANEL
BUN: 7 mg/dL (ref 6–23)
CO2: 30 mEq/L (ref 19–32)
Calcium: 9 mg/dL (ref 8.4–10.5)
Chloride: 100 mEq/L (ref 96–112)
Creatinine, Ser: 0.58 mg/dL (ref 0.40–1.20)
GFR: 101.25 mL/min (ref >60.00)
Glucose, Bld: 129 mg/dL — ABNORMAL HIGH (ref 70–99)
Potassium: 3.7 mEq/L (ref 3.5–5.1)
Sodium: 140 mEq/L (ref 135–145)

## 2022-06-20 LAB — HEMOGLOBIN A1C: Hgb A1c MFr Bld: 7.8 % — ABNORMAL HIGH (ref 4.6–6.5)

## 2022-06-20 MED ORDER — INSULIN GLARGINE 100 UNIT/ML ~~LOC~~ SOLN: 20.0000 [IU] | Freq: Every day | SUBCUTANEOUS | 11 refills | Status: DC

## 2022-06-20 MED ORDER — ALBUTEROL SULFATE HFA 108 (90 BASE) MCG/ACT IN AERS: 2.0000 | INHALATION_SPRAY | Freq: Four times a day (QID) | RESPIRATORY_TRACT | 6 refills | Status: DC | PRN

## 2022-06-20 MED ORDER — IPRATROPIUM-ALBUTEROL 0.5-2.5 (3) MG/3ML IN SOLN: 3.0000 mL | Freq: Three times a day (TID) | RESPIRATORY_TRACT | 3 refills | Status: DC

## 2022-06-20 NOTE — Assessment & Plan Note (Signed)
Stable. I will renew her Duonebs and albuterol. I will have her follow-up with the pulmonologist for long-term management of her respiratory issues.

## 2022-06-20 NOTE — Assessment & Plan Note (Signed)
I will check her A1c today. Continue dapagliflozin (Farxiga) 10 mg daily, glipizide 5 mg bid, insulin glargine 15 units daily and semaglutide (Ozempic) 2 mg weekly.

## 2022-06-20 NOTE — Assessment & Plan Note (Signed)
Stable. Continue Lyrica 150 mg bid.

## 2022-06-20 NOTE — Assessment & Plan Note (Signed)
Blood pressure is at goal. Continue metoprolol 25 mg bid and losartan 25 mg daily.

## 2022-06-20 NOTE — Progress Notes (Signed)
[facility] [address] [place] [address] Dept: [phone] Dept Fax: [phone]  Office Visit  Subjective:    Patient ID: [name], female    DOB: 1966-02-06, 57 y.o..   MRN: [id number]  Chief Complaint  Patient presents with   Hospitalization [facility] f/u from 06/09/22.    History of Present Illness:  Patient is in today for follow-up from her recent hospitalization. She was admitted at [facility] from 2/16-2/20/2024 with acute hypoxic respiratory failure due to COPD exacerbation related to influenza A infection. She notes she was treated with a Z-pack, which she has now finished. She admits that she has not fully recovered a this point. her husband is currently admitted in [place], also with influenza, on top of his neck cancer. She continues to smoke about 1 ppd. She was discharged home on a nicotine patch, but finds controlling her smoking at home is harder than in the hospital. She was provided with albuterol solution for a nebulizer and is finding this is helping. She has also used Duoneb at times.  [name] has a history of Type 2 diabetes. She is managed on dapagliflozin [place]ade) 10 mg daily, glipizide 5 mg bid, insulin glargine 15 units daily and semaglutide (Ozempic) 2 mg weekly. Her diabetes is complicated by peripheral neuropathy. This is managed with Lyrica 150 mg bid.    [name] has a history of hypertension. She is managed on metoprolol 25 mg bid and losartan 25 mg daily.  Past Medical History: Patient Active Problem List   Diagnosis Date Noted   AKI (acute kidney injury) ([place]) 06/09/2022   Coronary atherosclerosis 05/23/2022   Aortic atherosclerosis ([place]) 05/23/2022   Arthritis of left knee 12/14/2021   Acute respiratory failure with hypoxia ([place]) 06/02/2021   Marijuana use 03/14/2021   Allergic rhinitis 03/14/2021   Angina pectoris ([place]) 09/08/2019   Benzodiazepine  overdose 03/05/2019   Generalized muscle ache 09/24/2018   Chronic right ear pain 03/11/2018   Chronic sinusitis 03/11/2018   H/O drug abuse ([place]) 11/12/2017   Positive for macroalbuminuria 11/12/2017   Atypical chest pain 10/05/2017   COPD with chronic bronchitis 10/01/2017   Mixed hyperlipidemia 10/01/2017   Tobacco use disorder 10/01/2017   Diabetic peripheral neuropathy ([place]) 10/01/2017   Iron deficiency anemia 10/01/2017   Adult attention deficit hyperactivity disorder 01/14/2016   Major depressive disorder, recurrent severe without psychotic features ([place])    Leukocytosis 03/25/2014   OSA on CPAP 04/11/2013   Perforated ear drum 05/22/2012   Hypokalemia 05/08/2012   Bell's palsy 01/23/2012   Carpal tunnel syndrome on both sides 01/11/2012   Oxygen dependent 10/16/2011   Vitamin D deficiency 07/06/2011   Asthmatic bronchitis 06/11/2011   Back pain 06/11/2011   Morbid obesity with BMI of 45.0-49.9, adult ([place]) 05/06/2011   Gastric erosions 12/05/2010   Essential hypertension 12/02/2010   Type 2 diabetes mellitus with polyneuropathy ([place]) 12/02/2010   COPD with acute exacerbation ([place]) 11/30/2010   GERD (gastroesophageal reflux disease) 09/08/2010   Esophageal dysphagia 09/08/2010   Past Surgical History:  Procedure Laterality Date   CESAREAN SECTION  1988; [place]   COLONOSCOPY  09/16/2010   [id number] colon/small internal hemorrhoids   ESOPHAGOGASTRODUODENOSCOPY  09/16/2010   SLF: normal/mild gastritis   ESOPHAGOGASTRODUODENOSCOPY N/A 10/19/2014   Procedure: ESOPHAGOGASTRODUODENOSCOPY (EGD);  Surgeon: Danie Binder, MD;  Location: [facility];  Service: Endoscopy;  Laterality: N/A;  830  FRACTURE SURGERY     HYSTEROSCOPY WITH THERMACHOICE  01/17/2012   Procedure: HYSTEROSCOPY WITH THERMACHOICE;  Surgeon: Florian Buff, MD;  Location: [facility];  Service: Gynecology;  Laterality: N/A;  total therapy time: 9:13sec  D5W  18 ml in, D5W   41m out,  temperture 87degrees celcious   KIDNEY SURGERY     as child for blockages   LEFT HEART CATH AND CORONARY ANGIOGRAPHY N/A 09/08/2019   Procedure: LEFT HEART CATH AND CORONARY ANGIOGRAPHY;  Surgeon: JMartinique Peter M, MD;  [facility];  Service: Cardiovascular;  Laterality: N/A;   TONSILLECTOMY     TUBAL LIGATION  1989   TYMPANOSTOMY TUBE PLACEMENT Bilateral    "several times when I was a child"   uterine ablation     WRIST FRACTURE SURGERY Left 1995   Family History  Problem Relation Age of Onset   Heart attack Mother 592      deceased   Diabetes Mother    Breast cancer Mother 430      inflammatory breast ca   Heart failure Mother        oxygen dependence, nonsmoker   Heart disease Mother    Depression Mother    Cancer Mother        Breast   Hypertension Mother    Hyperlipidemia Mother    Sudden death Mother    Sleep apnea Mother    Obesity Mother    Heart attack Father 460      deceased, etoh use   Heart disease Father    Alcohol abuse Father    Depression Father    Heart failure Father    Sudden death Father    Ulcers Sister    Hypertension Sister    Heart failure Sister    Depression Sister    Anxiety disorder Sister    Liver disease Maternal AAunt 61      died while on liver transplant list   Liver disease Maternal Uncle    Cancer Paternal Aunt        Breast   Heart disease Maternal Grandmother    Heart attack Maternal Grandmother        premature CAD   Colon cancer Neg Hx    Outpatient Medications Prior to Visit  Medication Sig Dispense Refill   ACCU-CHEK GUIDE test strip USE UP TO FOUR TIMES DAILY AS DIRECTED 200 strip 11   Accu-Chek Softclix Lancets lancets USE UP TO FOUR TIMES DAILY AS DIRECTED 300 each 0   albuterol (PROVENTIL) (2.5 MG/3ML) 0.083% nebulizer solution Take 3 mLs (2.5 mg total) by nebulization every 6 (six) hours as needed for wheezing or shortness of breath. 75 mL 0   amphetamine-dextroamphetamine (ADDERALL) 30 MG tablet  Take 1 tablet by mouth 2 (two) times daily. 60 tablet 0   ARIPiprazole (ABILIFY) 5 MG tablet Take 1 tablet (5 mg total) by mouth daily. 90 tablet 1   atorvastatin (LIPITOR) 40 MG tablet TAKE 0.5 TABLETS (20 MG TOTAL) BY MOUTH 2 (TWO) TIMES DAILY. 90 tablet 0   bismuth subsalicylate (PEPTO BISMOL) 262 MG/15ML suspension Take 30 mLs by mouth every 6 (six) hours as needed for indigestion.     blood glucose meter kit and supplies Dispense based on patient and insurance preference. Use up to four times daily as directed. (FOR ICD-10 E10.9, E11.9). 1 each 0   clonazePAM (KLONOPIN) 0.5 MG tablet Take 1 tablet (0.5 mg total) by mouth 2 (two) times  daily as needed for anxiety. 60 tablet 2   Dextromethorphan-guaiFENesin (MUCINEX DM) 30-600 MG TB12 Take 1 tablet by mouth every 12 (twelve) hours as needed (for coughing or to loosen mucus).     DULoxetine (CYMBALTA) 60 MG capsule TAKE 1 CAPSULE BY MOUTH EVERY DAY (Patient taking differently: Take 60 mg by mouth in the morning.) 90 capsule 1   FARXIGA 10 MG TABS tablet TAKE 1 TABLET(10 MG) BY MOUTH DAILY (Patient taking differently: Take 10 mg by mouth daily.) 90 tablet 3   fluticasone (FLONASE) 50 MCG/ACT nasal spray PLACE 1 SPRAY INTO BOTH NOSTRILS 2 (TWO) TIMES DAILY AS NEEDED FOR ALLERGIES OR RHINITIS (AFTER SINUS RINSE). (Patient taking differently: Place 1 spray into both nostrils in the morning and at bedtime.) 48 mL 2   glipiZIDE (GLUCOTROL) 5 MG tablet TAKE 1 TABLET(5 MG) BY MOUTH TWICE DAILY BEFORE A MEAL (Patient taking differently: Take 5 mg by mouth 2 (two) times daily before a meal.) 60 tablet 3   ibuprofen (ADVIL) 200 MG tablet Take 600 mg by mouth every 6 (six) hours as needed for mild pain or headache.     insulin glargine (LANTUS) 100 UNIT/ML injection INJECT 0.15 MLS (15 UNITS TOTAL) INTO THE SKIN DAILY. (Patient taking differently: Inject 15 Units into the skin daily with breakfast.) 10 mL 11   Insulin Pen Needle (BD PEN NEEDLE NANO 2ND GEN) 32G X  4 MM MISC USE DAILY WITH VICTOZA 100 each 11   Insulin Syringe-Needle U-100 (INSULIN SYRINGE .5CC/31GX5/16") 31G X 5/16" 0.5 ML MISC USE AS DIRECTED ONCE DAILY 100 each 2   losartan (COZAAR) 25 MG tablet TAKE 1 TABLET EVERY DAY 90 tablet 0   metoprolol tartrate (LOPRESSOR) 25 MG tablet TAKE 1 TABLET(25 MG) BY MOUTH TWICE DAILY (Patient taking differently: Take 25 mg by mouth daily.) 180 tablet 0   nystatin (MYCOSTATIN/NYSTOP) powder Apply 1 application topically 2 (two) times daily. (Patient taking differently: Apply 1 application  topically 2 (two) times daily as needed (rash).) 15 g 3   pantoprazole (PROTONIX) 40 MG tablet TAKE 1 TABLET BY MOUTH TWICE A DAY (Patient taking differently: Take 40 mg by mouth 2 (two) times daily before a meal.) 60 tablet 11   pregabalin (LYRICA) 150 MG capsule TAKE 1 CAPSULE BY MOUTH TWICE A DAY 180 capsule 5   Semaglutide, 2 MG/DOSE, 8 MG/3ML SOPN Inject 2 mg as directed once a week. (Patient taking differently: Inject 2 mg as directed every Friday.) 3 mL 6   albuterol (VENTOLIN HFA) 108 (90 Base) MCG/ACT inhaler Inhale 2 puffs into the lungs every 6 (six) hours as needed for wheezing or shortness of breath.     ipratropium-albuterol (DUONEB) 0.5-2.5 (3) MG/3ML SOLN Take 3 mLs by nebulization 3 (three) times daily. 360 mL 3   Nicotine (NICODERM CQ TD) Place 1 patch onto the skin daily as needed (FOR SMOKING CESSATION WHILE HOSPITALIZED). (Patient not taking: Reported on 06/20/2022)     predniSONE (DELTASONE) 10 MG tablet Take 40 mg daily x 2 days, then  Take 30 mg daily x 2 days, then Take 20 mg daily x 2 days, then Take 10 mg daily x 2 days, then stop. 10 tablet 0   No facility-administered medications prior to visit.   Allergies  Allergen Reactions   Codeine Itching and Nausea Only   Metformin And Related Diarrhea, Nausea Only and Other (See Comments)    Stomach pain/nausea   Wellbutrin [Bupropion Hcl] Hives   Acyclovir And Related Rash  Objective:    Today's Vitals   06/20/22 1040  BP: 124/70  Pulse: 87  Temp: (!) 97.3 F (36.3 C)  TempSrc: Temporal  SpO2: 94%  Weight: 272 lb 6.4 oz (123.6 kg)  Height: '5\' 4"'$  (1.626 m)   Body mass index is 46.76 kg/m.   General: Well developed, well nourished. No acute distress. Lungs: Scattered wheezing, but no respiratory distress.  Feet- Skin intact. No sign of maceration between toes. Nails are normal. Dorsalis pedis and posterior tibial artery pulses are normal.   5.07 monofilament testing demonstrates loss of sensaiton over most of the sole of the foot. Psych: Alert and oriented. Normal mood and affect.  Health Maintenance Due  Topic Date Due   Zoster Vaccines- Shingrix (1 of 2) Never done   Medicare Annual Wellness (AWV)  10/29/2020   OPHTHALMOLOGY EXAM  05/25/2021     Assessment & Plan:   Problem List Items Addressed This Visit       Cardiovascular and Mediastinum   Essential hypertension - Primary    Blood pressure is at goal. Continue metoprolol 25 mg bid and losartan 25 mg daily.        Respiratory   COPD with chronic bronchitis    Stable. I will renew her Duonebs and albuterol. I will have her follow-up with the pulmonologist for long-term management of her respiratory issues.      Relevant Medications   albuterol (VENTOLIN HFA) 108 (90 Base) MCG/ACT inhaler   ipratropium-albuterol (DUONEB) 0.5-2.5 (3) MG/3ML SOLN   Other Relevant Orders   Ambulatory referral to Pulmonology     Endocrine   Type 2 diabetes mellitus with polyneuropathy ([place])    I will check her A1c today. Continue dapagliflozin (Farxiga) 10 mg daily, glipizide 5 mg bid, insulin glargine 15 units daily and semaglutide (Ozempic) 2 mg weekly.      Relevant Orders   Hemoglobin A1c (Completed)   Basic metabolic panel (Completed)   Diabetic peripheral neuropathy (HCC)    Stable. Continue Lyrica 150 mg bid.        Other   Tobacco use disorder (Chronic)    I continue to advise smoking cessation.  [name] has found NRT to not be helpful on her own at home. We discussed the difficulties, but I encouraged her to continue to make attempts.       Return in about 3 months (around 09/18/2022) for Reassessment.   Haydee Salter, MD

## 2022-06-20 NOTE — Assessment & Plan Note (Signed)
I continue to advise smoking cessation. [name] has found NRT to not be helpful on her own at home. We discussed the difficulties, but I encouraged her to continue to make attempts.

## 2022-06-21 ENCOUNTER — Telehealth (HOSPITAL_COMMUNITY): Payer: Medicare HMO | Admitting: Psychiatry

## 2022-06-21 ENCOUNTER — Encounter (HOSPITAL_COMMUNITY): Payer: Self-pay | Admitting: Psychiatry

## 2022-06-21 DIAGNOSIS — F331 Major depressive disorder, recurrent, moderate: Secondary | ICD-10-CM

## 2022-06-21 DIAGNOSIS — F901 Attention-deficit hyperactivity disorder, predominantly hyperactive type: Secondary | ICD-10-CM

## 2022-06-21 DIAGNOSIS — R69 Illness, unspecified: Secondary | ICD-10-CM | POA: Diagnosis not present

## 2022-06-21 MED ORDER — ARIPIPRAZOLE 5 MG PO TABS: 5.0000 mg | ORAL_TABLET | Freq: Every day | ORAL | 1 refills | Status: DC

## 2022-06-21 MED ORDER — AMPHETAMINE-DEXTROAMPHETAMINE 30 MG PO TABS: 30.0000 mg | ORAL_TABLET | Freq: Two times a day (BID) | ORAL | 0 refills | Status: DC

## 2022-06-21 MED ORDER — CLONAZEPAM 0.5 MG PO TABS: 0.5000 mg | ORAL_TABLET | Freq: Two times a day (BID) | ORAL | 2 refills | Status: DC | PRN

## 2022-06-21 MED ORDER — AMPHETAMINE-DEXTROAMPHETAMINE 30 MG PO TABS: 30.0000 mg | ORAL_TABLET | Freq: Every day | ORAL | 0 refills | Status: DC

## 2022-06-21 MED ORDER — DULOXETINE HCL 60 MG PO CPEP: 60.0000 mg | ORAL_CAPSULE | Freq: Every morning | ORAL | 2 refills | Status: DC

## 2022-06-21 NOTE — Progress Notes (Signed)
Virtual Visit via Telephone Note  I connected with [name] on 06/21/22 at  9:20 AM EST by telephone and verified that I am speaking with the correct person using two identifiers.  Location: Patient: home Provider: office   I discussed the limitations, risks, security and privacy concerns of performing an evaluation and management service by telephone and the availability of in person appointments. I also discussed with the patient that there may be a patient responsible charge related to this service. The patient expressed understanding and agreed to proceed.     I discussed the assessment and treatment plan with the patient. The patient was provided an opportunity to ask questions and all were answered. The patient agreed with the plan and demonstrated an understanding of the instructions.   The patient was advised to call back or seek an in-person evaluation if the symptoms worsen or if the condition fails to improve as anticipated.  I provided 15 minutes of non-face-to-face time during this encounter.   Sheila Spiller, MD  [facility] MD/PA/NP OP Progress Note  06/21/2022 9:40 AM [name] Pin  MRN:  [id number]  Chief Complaint:  Chief Complaint  Patient presents with   Depression   Anxiety   ADD   Follow-up   HPI: This patient is a 57 year old married white female who lives with her husband in [place].  She is on disability.  The patient returns for follow-up regarding her depression anxiety and ADD after about 3 and half months.  She is just hospitalized about 2 weeks ago for acute respiratory failure on top of having influenza A.  She has been home from the hospital about a week and still feels very fatigued.  Right now her husband is in the hospital at [facility] because he also has influenza A and was receiving immunotherapy for throat cancer and was unable to swallow and lost a lot of weight.  He does not have a very good prognosis and this is  making her sad.  She is trying to go back and forth visiting him but it has been difficult.  However she still feels that her medications for depression and anxiety and focus are working well for her and she denies thoughts of self-harm or suicidal ideation. Visit Diagnosis:    ICD-10-CM   1. Moderate episode of recurrent major depressive disorder (HCC)  F33.1     2. Attention deficit hyperactivity disorder (ADHD), predominantly hyperactive type  F90.1       Past Psychiatric History:  The patient was hospitalized twice in her 57s and again in 2006 and 2020 for drug overdose with suicidal intent    Past Medical History:  Past Medical History:  Diagnosis Date   ADHD    Alcohol abuse    Anemia    Anxiety    Asthmatic bronchitis    normal PFT/ seen by pulmonary no evidence of COPD   Atypical chest pain 10/05/2017   Back pain    Bilateral swelling of feet    Chest pain    Chest pain on respiration 03/25/2014   Chronic bronchitis (HCC)    Chronic lower back pain    Chronic respiratory failure ([place]) 10/16/2011   Newly 02 dep 24/7 p discharge from [facility] 01/2013  - 03/13/2014  Walked RA  2 laps @ 185 ft each stopped due to  Sob/ aching in legs, thirsty/ no desat @ slow pace    Chronic respiratory failure with hypoxia ([place])    On 2-3  L of oxygen at home   Cigarette smoker 12/02/2010   Followed in Pulmonary clinic/ [facility]/ [place]   - Limits of effective care reviewed [id number]     Complication of anesthesia    Constipation    COPD (chronic obstructive pulmonary disease) ([place])    COPD with chronic bronchitis 10/01/2017   Daily headache    Depression    Diabetic peripheral neuropathy ([place])    DM (diabetes mellitus) type II controlled, neurological manifestation ([place]) 12/02/2010   Drug use    Gallbladder problem    Gastric erosions    EGD 08/2010.   GERD (gastroesophageal reflux disease)    Glaucoma    H/O drug abuse ([place]) 11/12/2017   -- scanned document from outside source: [facility] in [place] which showed UDS positive for Adderall /amphetamine usage as well as positive urine for THC.  This test result was collected 08/11/2016 and reported 10/07/2016. - lso review of the chart shows another positive amphetamine, THC and METH in the urine back on 10/18/2015 under "care everywhere". ---So    Heavy menses    High cholesterol    History of blood transfusion    "related to low HgB" (10/05/2017)   History of hiatal hernia    HTN (hypertension)    Hyperlipidemia associated with type 2 diabetes mellitus ([place]) 10/18/2015   Hypertension associated with diabetes ([place]) 12/02/2010   D/c acei 12/22/2011 due to psuedowheeze and narcotic dependent cough> ? Improved - [id number] started bystolic in place of cozar due to cough     IBS (irritable bowel syndrome)    Increased urinary protein excretion    Internal hemorrhoids    Colonoscopy 5/12.   Iron deficiency anemia 10/01/2017   Joint pain    Mixed diabetic hyperlipidemia associated with type 2 diabetes mellitus ([place]) 10/01/2017   On home oxygen therapy    "5L at night" (10/05/2017)   OSA on CPAP    Osteoarthritis    "back" (10/05/2017)   Oxygen dependent 10/16/2011   Pneumonia    "lots of times" (10/05/2017)   PONV (postoperative nausea and vomiting)    Poorly controlled diabetes mellitus ([place]) 11/12/2017   Pulmonary infiltrates 12/22/2011   Followed in Pulmonary clinic/ [facility]/ [place]    - See CT Chest 05/05/11    Shortness of breath    Tachycardia    never had test done since no insurance   Tobacco use disorder-current smoker greater than 40-pack-year history- since age 60 1 ppd 10/01/2017   Type II diabetes mellitus ([place])    Vitamin D deficiency     Past Surgical History:  Procedure Laterality Date   CESAREAN SECTION  1988; [place]   COLONOSCOPY  09/16/2010   [id number] colon/small internal hemorrhoids   ESOPHAGOGASTRODUODENOSCOPY  09/16/2010   SLF:  normal/mild gastritis   ESOPHAGOGASTRODUODENOSCOPY N/A 10/19/2014   Procedure: ESOPHAGOGASTRODUODENOSCOPY (EGD);  Surgeon: Danie Binder, MD;  Location: [facility];  Service: Endoscopy;  Laterality: N/A;  [place]  01/17/2012   Procedure: HYSTEROSCOPY WITH THERMACHOICE;  Surgeon: Florian Buff, MD;  Location: [facility] ORS;  Service: Gynecology;  Laterality: N/A;  total therapy time: 9:13sec  D5W  18 ml in, D5W   29m out, temperture 87degrees celcious   KIDNEY SURGERY     as child for blockages   LEFT HEART CATH AND CORONARY ANGIOGRAPHY N/A 09/08/2019  Procedure: LEFT HEART CATH AND CORONARY ANGIOGRAPHY;  Surgeon: Martinique, Peter M, MD;  Location: [facility] CV LAB;  Service: Cardiovascular;  Laterality: N/A;   TONSILLECTOMY     TUBAL LIGATION  1989   TYMPANOSTOMY TUBE PLACEMENT Bilateral    "several times when I was a child"   uterine ablation     WRIST FRACTURE SURGERY Left 1995    Family Psychiatric History: See below  Family History:  Family History  Problem Relation Age of Onset   Heart attack Mother 80       deceased   Diabetes Mother    Breast cancer Mother 79       inflammatory breast ca   Heart failure Mother        oxygen dependence, nonsmoker   Heart disease Mother    Depression Mother    Cancer Mother        Breast   Hypertension Mother    Hyperlipidemia Mother    Sudden death Mother    Sleep apnea Mother    Obesity Mother    Heart attack Father 30       deceased, etoh use   Heart disease Father    Alcohol abuse Father    Depression Father    Heart failure Father    Sudden death Father    Ulcers Sister    Hypertension Sister    Heart failure Sister    Depression Sister    Anxiety disorder Sister    Liver disease Maternal Aunt 74       died while on liver transplant list   Liver disease Maternal Uncle    Cancer Paternal Aunt        Breast   Heart disease Maternal Grandmother    Heart attack Maternal  Grandmother        premature CAD   Colon cancer Neg Hx     Social History:  Social History   Socioeconomic History   Marital status: Married    Spouse name: Not on file   Number of children: 2   Years of education: Not on file   Highest education level: Not on file  Occupational History   Occupation: unemployed  Tobacco Use   Smoking status: Every Day    Packs/day: 1.00    Years: 40.00    Total pack years: 40.00    Types: Cigarettes, E-cigarettes    Start date: 1980   Smokeless tobacco: Current   Tobacco comments:    vape  Scientific laboratory technician Use: Every day  Substance and Sexual Activity   Alcohol use: Not Currently   Drug use: Yes    Types: Marijuana    Comment: occ   Sexual activity: Not Currently    Partners: Male    Birth control/protection: Surgical    Comment: BTL, younger than 54, more than 5  Other Topics Concern   Not on file  Social History Narrative   Has 2 step children.   Lives in multi-family home with children and grandchildren.   On disability.   Social Determinants of Health   Financial Resource Strain: Low Risk  (10/30/2019)   Overall Financial Resource Strain (CARDIA)    Difficulty of Paying Living Expenses: Not hard at all  Food Insecurity: No Food Insecurity (06/14/2022)   Hunger Vital Sign    Worried About Running Out of Food in the Last Year: Never true    Ran Out of Food in the Last Year: Never true  Transportation  Needs: No Transportation Needs (06/09/2022)   PRAPARE - Hydrologist (Medical): No    Lack of Transportation (Non-Medical): No  Physical Activity: Inactive (10/30/2019)   Exercise Vital Sign    Days of Exercise per Week: 0 days    Minutes of Exercise per Session: 0 min  Stress: Not on file  Social Connections: Moderately Isolated (10/30/2019)   Social Connection and Isolation Panel [NHANES]    Frequency of Communication with Friends and Family: More than three times a week    Frequency of Social  Gatherings with Friends and Family: More than three times a week    Attends Religious Services: Never    Marine scientist or Organizations: No    Attends Archivist Meetings: Never    Marital Status: Married    Allergies:  Allergies  Allergen Reactions   Codeine Itching and Nausea Only   Metformin And Related Diarrhea, Nausea Only and Other (See Comments)    Stomach pain/nausea   Wellbutrin [Bupropion Hcl] Hives   Acyclovir And Related Rash    Metabolic Disorder Labs: Lab Results  Component Value Date   HGBA1C 7.8 (H) 06/20/2022   MPG 177.16 06/02/2021   MPG 188.64 03/05/2019   No results found for: "PROLACTIN" Lab Results  Component Value Date   CHOL 119 12/14/2021   TRIG 86.0 12/14/2021   HDL 44.10 12/14/2021   CHOLHDL 3 12/14/2021   VLDL 17.2 12/14/2021   LDLCALC 57 12/14/2021   LDLCALC 63 03/14/2021   Lab Results  Component Value Date   TSH 1.860 06/30/2019   TSH 2.40 11/26/2018    Therapeutic Level Labs: No results found for: "LITHIUM" No results found for: "VALPROATE" No results found for: "CBMZ"  Current Medications: Current Outpatient Medications  Medication Sig Dispense Refill   amphetamine-dextroamphetamine (ADDERALL) 30 MG tablet Take 1 tablet by mouth 2 (two) times daily. 60 tablet 0   amphetamine-dextroamphetamine (ADDERALL) 30 MG tablet Take 1 tablet by mouth daily. 60 tablet 0   ACCU-CHEK GUIDE test strip USE UP TO FOUR TIMES DAILY AS DIRECTED 200 strip 11   Accu-Chek Softclix Lancets lancets USE UP TO FOUR TIMES DAILY AS DIRECTED 300 each 0   albuterol (PROVENTIL) (2.5 MG/3ML) 0.083% nebulizer solution Take 3 mLs (2.5 mg total) by nebulization every 6 (six) hours as needed for wheezing or shortness of breath. 75 mL 0   albuterol (VENTOLIN HFA) 108 (90 Base) MCG/ACT inhaler Inhale 2 puffs into the lungs every 6 (six) hours as needed for wheezing or shortness of breath. 8 g 6   amphetamine-dextroamphetamine (ADDERALL) 30 MG tablet  Take 1 tablet by mouth 2 (two) times daily. 60 tablet 0   ARIPiprazole (ABILIFY) 5 MG tablet Take 1 tablet (5 mg total) by mouth daily. 90 tablet 1   atorvastatin (LIPITOR) 40 MG tablet TAKE 0.5 TABLETS (20 MG TOTAL) BY MOUTH 2 (TWO) TIMES DAILY. 90 tablet 0   bismuth subsalicylate (PEPTO BISMOL) 262 MG/15ML suspension Take 30 mLs by mouth every 6 (six) hours as needed for indigestion.     blood glucose meter kit and supplies Dispense based on patient and insurance preference. Use up to four times daily as directed. (FOR ICD-10 E10.9, E11.9). 1 each 0   clonazePAM (KLONOPIN) 0.5 MG tablet Take 1 tablet (0.5 mg total) by mouth 2 (two) times daily as needed for anxiety. 60 tablet 2   Dextromethorphan-guaiFENesin (MUCINEX DM) 30-600 MG TB12 Take 1 tablet by mouth every 12 (  twelve) hours as needed (for coughing or to loosen mucus).     DULoxetine (CYMBALTA) 60 MG capsule Take 1 capsule (60 mg total) by mouth in the morning. 90 capsule 2   FARXIGA 10 MG TABS tablet TAKE 1 TABLET(10 MG) BY MOUTH DAILY (Patient taking differently: Take 10 mg by mouth daily.) 90 tablet 3   fluticasone (FLONASE) 50 MCG/ACT nasal spray PLACE 1 SPRAY INTO BOTH NOSTRILS 2 (TWO) TIMES DAILY AS NEEDED FOR ALLERGIES OR RHINITIS (AFTER SINUS RINSE). (Patient taking differently: Place 1 spray into both nostrils in the morning and at bedtime.) 48 mL 2   glipiZIDE (GLUCOTROL) 5 MG tablet TAKE 1 TABLET(5 MG) BY MOUTH TWICE DAILY BEFORE A MEAL (Patient taking differently: Take 5 mg by mouth 2 (two) times daily before a meal.) 60 tablet 3   ibuprofen (ADVIL) 200 MG tablet Take 600 mg by mouth every 6 (six) hours as needed for mild pain or headache.     insulin glargine (LANTUS) 100 UNIT/ML injection Inject 0.2 mLs (20 Units total) into the skin daily with breakfast. 10 mL 11   Insulin Pen Needle (BD PEN NEEDLE NANO 2ND GEN) 32G X 4 MM MISC USE DAILY WITH VICTOZA 100 each 11   Insulin Syringe-Needle U-100 (INSULIN SYRINGE .5CC/31GX5/16") 31G  X 5/16" 0.5 ML MISC USE AS DIRECTED ONCE DAILY 100 each 2   ipratropium-albuterol (DUONEB) 0.5-2.5 (3) MG/3ML SOLN Take 3 mLs by nebulization 3 (three) times daily. 360 mL 3   losartan (COZAAR) 25 MG tablet TAKE 1 TABLET EVERY DAY 90 tablet 0   metoprolol tartrate (LOPRESSOR) 25 MG tablet TAKE 1 TABLET(25 MG) BY MOUTH TWICE DAILY (Patient taking differently: Take 25 mg by mouth daily.) 180 tablet 0   Nicotine (NICODERM CQ TD) Place 1 patch onto the skin daily as needed (FOR SMOKING CESSATION WHILE HOSPITALIZED). (Patient not taking: Reported on 06/20/2022)     nystatin (MYCOSTATIN/NYSTOP) powder Apply 1 application topically 2 (two) times daily. (Patient taking differently: Apply 1 application  topically 2 (two) times daily as needed (rash).) 15 g 3   pantoprazole (PROTONIX) 40 MG tablet TAKE 1 TABLET BY MOUTH TWICE A DAY (Patient taking differently: Take 40 mg by mouth 2 (two) times daily before a meal.) 60 tablet 11   pregabalin (LYRICA) 150 MG capsule TAKE 1 CAPSULE BY MOUTH TWICE A DAY 180 capsule 5   Semaglutide, 2 MG/DOSE, 8 MG/3ML SOPN Inject 2 mg as directed once a week. (Patient taking differently: Inject 2 mg as directed every Friday.) 3 mL 6   No current facility-administered medications for this visit.     Musculoskeletal: Strength & Muscle Tone: na Gait & Station: na Patient leans: N/A  Psychiatric Specialty Exam: Review of Systems  Constitutional:  Positive for fatigue.  Respiratory:  Positive for shortness of breath.   Psychiatric/Behavioral:  Positive for dysphoric mood.   All other systems reviewed and are negative.   Last menstrual period 07/24/2014.There is no height or weight on file to calculate BMI.  General Appearance: NA  Eye Contact:  NA  Speech:  Clear and Coherent  Volume:  Normal  Mood:  Euthymic, a bit dysphoric  Affect:  NA  Thought Process:  Goal Directed  Orientation:  Full (Time, Place, and Person)  Thought Content: Rumination   Suicidal Thoughts:   No  Homicidal Thoughts:  No  Memory:  Immediate;   Good Recent;   Good Remote;   Good  Judgement:  Good  Insight:  Good  Psychomotor Activity:  Decreased  Concentration:  Concentration: Good and Attention Span: Good  Recall:  Good  Fund of Knowledge: Good  Language: Good  Akathisia:  No  Handed:  Right  AIMS (if indicated): not done  Assets:  Communication Skills Desire for Improvement Resilience Social Support  ADL's:  Intact  Cognition: WNL  Sleep:  Good   Screenings: AIMS    Flowsheet Row Admission (Discharged) from 03/07/2019 in [place] Admission (Discharged) from 09/12/2014 in [facility] [facility]  AIMS Total Score 0 0      AUDIT    Flowsheet Row Admission (Discharged) from 03/07/2019 in [place] Admission (Discharged) from 09/12/2014 in [place] [facility]  Alcohol Use Disorder Identification Test Final Score (AUDIT) 0 0      GAD-7    Flowsheet Row Office Visit from 03/20/2022 in [place] at [facility] Visit from 03/14/2021 in [place] at [facility] Visit from 10/20/2020 in [place] at [facility] Visit from 11/06/2019 in [place] at [facility]  Total GAD-7 Score '8 6 9 5      '$ PHQ2-9    [place] Office Visit from 03/20/2022 in [place] at [facility] Video Visit from 03/03/2022 in [place] at [place] from 01/25/2022 in [place] Visit from 12/14/2021 in [place] at [facility] Video Visit from 12/01/2021 in [place] at [facility] Total Score '3 1 1 '$ 0 0  PHQ-9 Total Score 9 -- -- 5 --      Flowsheet Row ED to Hosp-Admission (Discharged) from  06/09/2022 in [place] Video Visit from 03/03/2022 in [place] at [place] Video Visit from 12/01/2021 in [place] at [facility] No Risk No Risk No Risk        Assessment and Plan: This patient is a 57 year old female with a history of depression anxiety and ADD.  She states she is very sad about her husband's situation which is understandable but she is holding her own despite her own recent hospitalization.  She will continue Cymbalta 60 mg daily for depression, clonazepam 0.5 mg twice daily for anxiety, Abilify 5 mg daily for augmentation and Adderall 30 mg twice daily for ADD.  She will return to see me in 3 months  Collaboration of Care: Collaboration of Care: Primary Care Provider AEB notes are shared with PCP on the epic system  Patient/Guardian was advised Release of Information must be obtained prior to any record release in order to collaborate their care with an outside provider. Patient/Guardian was advised if they have not already done so to contact the registration department to sign all necessary forms in order for [place] to release information regarding their care.   Consent: Patient/Guardian gives verbal consent for treatment and assignment of benefits for services provided during this visit. Patient/Guardian expressed understanding and agreed to proceed.    Sheila Spiller, MD 06/21/2022, 9:40 AM

## 2022-06-26 ENCOUNTER — Other Ambulatory Visit: Payer: Self-pay | Admitting: Family Medicine

## 2022-06-26 ENCOUNTER — Telehealth: Payer: Self-pay

## 2022-06-26 DIAGNOSIS — E1169 Type 2 diabetes mellitus with other specified complication: Secondary | ICD-10-CM

## 2022-06-26 NOTE — Progress Notes (Signed)
Care Management & Coordination Services Pharmacy Team  Reason for Encounter: Appointment Reminder  Contacted patient to confirm in office appointment with Daron Offer, PharmD on 06/29/2022 at 10:00 am. Spoke with patient on 06/26/2022   Do you have any problems getting your medications? No Patient denies any issue/problems getting her medications at this time.  What is your top health concern you would like to discuss at your upcoming visit?  Overall health,patient denies any top concerns at this time.   Have you seen any other providers since your last visit with PCP? No   Chart review:  Recent office visits:  06/20/2022 Dr. Gena Fray MD (PCP) No Medication changes noted 03/20/2022 Dr. Gena Fray MD (PCP) Change Semaglutide 2 mg injection weekly, Return in about 3 months   Recent consult visits:  06/21/2022 Dr. Harrington Challenger MD ([place]) Unable to see note 03/03/2022 Dr. Harrington Challenger MD ([place]) Unable to see note  Hospital visits:  Medication Reconciliation was completed by comparing discharge summary, patient's EMR and Pharmacy list, and upon discussion with patient.  Admitted to the hospital on 06/09/2022 due to Acute Respiratory failure with hypoxia. Discharge date was 06/13/2022. Discharged from [place]?Medications Started at [facility] Discharge:?? -started Prednisone 10 mg taper dose  Medication Changes at Hospital Discharge: -Changed Atorvastatin to 40 mg to 0.5 2 times daily -Changed insulin glargine 100 UNIT/ML injection INJECT 0.15 MLS (15 UNITS TOTAL) INTO THE SKIN DAILY.   Medications Discontinued at Hospital Discharge: -Stopped furosemide   -Stopped isosorbide mononitrate  Stopped meloxicam   Medications that remain the same after Hospital Discharge:??  -All other medications will remain the same.     Star Rating Drugs:  Medication: Atorvastatin 40 mg Last Fill: 06/16/2022 [facility] Medication: Glipizide 5 mg Last  Fill: 06/25/2022 30 Day Supply at [facility]. Medication: Losartan 25 mg Last Fill: 06/16/2022 [facility].  Care Gaps: Annual wellness visit in last year? No last completed was 10/29/2020 Shingrix Vaccine  If Diabetic: Last eye exam / retinopathy screening:Last completed 05/25/2021 Last diabetic foot exam:last completed 06/20/2022   [place] Pharmacist Assistant [phone]

## 2022-06-29 ENCOUNTER — Telehealth: Payer: Self-pay

## 2022-06-29 ENCOUNTER — Ambulatory Visit: Payer: Medicare HMO

## 2022-06-29 DIAGNOSIS — J4489 Other specified chronic obstructive pulmonary disease: Secondary | ICD-10-CM

## 2022-06-29 DIAGNOSIS — E1142 Type 2 diabetes mellitus with diabetic polyneuropathy: Secondary | ICD-10-CM

## 2022-06-29 MED ORDER — ANORO ELLIPTA 62.5-25 MCG/ACT IN AEPB: 1.0000 | INHALATION_SPRAY | Freq: Every day | RESPIRATORY_TRACT | 11 refills | Status: DC

## 2022-06-29 NOTE — Progress Notes (Signed)
Per Clinical pharmacist, Please let the patient know that I spoke with Dr. Gena Fray and he was in agreement regarding the following:   START Anoro 1 puff daily. This is a long acting inhaler that will help with her COPD symptoms. She should use it at the same time everyday. if she has questions regarding the inhaler she can stop by the clinic next thursday AM to review how to best use the medicine.   Patient Verbalized understanding, and confirm [facility] [address].Patient states she should be fine,but if she has questions she will return my call or go to her PCP office on Thursday.   [place] Pharmacist Assistant [phone]

## 2022-06-29 NOTE — Patient Instructions (Addendum)
Visit Information It was great speaking with you today!  Please let me know if you have any questions about our visit.  Plan:  Please bring all your medications to our next visit.  Please bring your continuous glucose monitor supplies to our next visit.   Print copy of patient instructions, educational materials, and care plan provided in person.  Face to Face appointment with pharmacist scheduled for:  07/27/2022 at 1:00 PM  Junius Argyle, PharmD, [place], CPP Clinical Pharmacist Practitioner  [facility] [place]  [place] Pharmacist Assistant [phone]

## 2022-06-29 NOTE — Progress Notes (Signed)
Care Management & Coordination Services Pharmacy Note  06/29/2022 Name:  [name] MRN:  [id number] DOB:  1965-11-24  Summary: Patient presents for initial pharmacy consult. A full medication reconciliation was not done today as patient did not bring her medications with her.   Patient has restarted meloxicam.   MMRC score: 3. Patient reports continued symptoms of coughing, sputum production which is at her baseline. She notes dyspnea with exertion. Patient is still waiting to hear back regarding scheduling pulmonology referral.   She also reports increased chest pain since home from the hospital. Chest pain with exertion. Rates pain between 4-7/10. She does also report increased reflux symptoms despite Protonix BID.   Recommendations/Changes made from today's visit: -START Anoro 1 puff daily  -Start documentation for CGM.   Follow up plan: CPP follow-up 1 month    Subjective: [name] is an 57 y.o. year old female who is a primary patient of Rudd, Lillette Boxer, MD.  The care coordination team was consulted for assistance with disease management and care coordination needs.    Engaged with patient face to face for initial visit.  Recent office visits: 06/20/2022 Dr. Gena Fray MD (PCP) No Medication changes noted 03/20/2022 Dr. Gena Fray MD (PCP) Change Semaglutide 2 mg injection weekly, Return in about 3 months   Recent consult visits: 06/21/2022 Dr. Harrington Challenger MD [facility]) Unable to see note 03/03/2022 Dr. Harrington Challenger MD ([place]) Unable to see note  Hospital visits: Admitted to the hospital on 06/09/2022 due to Acute Respiratory failure with hypoxia. Discharge date was 06/13/2022. Discharged from [facility].    Objective:  Lab Results  Component Value Date   CREATININE 0.58 06/20/2022   BUN 7 06/20/2022   GFR 101.25 06/20/2022   GFRNONAA >60 06/13/2022   GFRAA 94 10/29/2019   NA 140 06/20/2022   K 3.7 06/20/2022   CALCIUM 9.0  06/20/2022   CO2 30 06/20/2022   GLUCOSE 129 (H) 06/20/2022    Lab Results  Component Value Date/Time   HGBA1C 7.8 (H) 06/20/2022 11:27 AM   HGBA1C 7.1 (H) 03/20/2022 10:54 AM   HGBA1C 6.1 (H) 10/03/2013 04:55 PM   GFR 101.25 06/20/2022 11:27 AM   GFR 99.61 12/14/2021 11:38 AM   MICROALBUR <0.7 12/14/2021 11:38 AM   MICROALBUR <0.7 10/20/2020 04:38 PM   MICROALBUR 10 10/01/2017 05:15 PM    Last diabetic Eye exam:  Lab Results  Component Value Date/Time   HMDIABEYEEXA No Retinopathy 07/04/2019 12:00 AM    Last diabetic Foot exam: No results found for: "HMDIABFOOTEX"   Lab Results  Component Value Date   CHOL 119 12/14/2021   HDL 44.10 12/14/2021   LDLCALC 57 12/14/2021   LDLDIRECT 121 (H) 10/03/2013   TRIG 86.0 12/14/2021   CHOLHDL 3 12/14/2021       Latest Ref Rng & Units 06/09/2022   12:18 PM 06/03/2021    5:35 AM 05/31/2021    1:55 PM  Hepatic Function  Total Protein 6.5 - 8.1 g/dL 7.7  7.2  6.7   Albumin 3.5 - 5.0 g/dL 3.9  3.4  3.3   AST 15 - 41 U/L 46  17  18   ALT 0 - 44 U/L '29  20  18   '$ Alk Phosphatase 38 - 126 U/L 59  56  59   Total Bilirubin 0.3 - 1.2 mg/dL 0.6  0.2  0.1     Lab Results  Component Value Date/Time   TSH 1.860 06/30/2019 01:45 PM  TSH 2.40 11/26/2018 02:33 PM   FREET4 1.17 06/30/2019 01:45 PM   FREET4 1.33 09/24/2018 12:00 PM       Latest Ref Rng & Units 06/13/2022    5:00 AM 06/12/2022    4:43 AM 06/11/2022    5:27 AM  CBC  WBC 4.0 - 10.5 K/uL 10.5  14.1  14.1   Hemoglobin 12.0 - 15.0 g/dL 13.1  13.7  13.9   Hematocrit 36.0 - 46.0 % 43.0  45.3  45.0   Platelets 150 - 400 K/uL 145  167  184     Lab Results  Component Value Date/Time   VD25OH 30.45 10/20/2020 04:38 PM   VD25OH 24.9 (L) 06/30/2019 01:45 PM   VD25OH 46.7 09/24/2018 12:00 PM   [id number] 06/30/2019 01:45 PM   [id number] 10/02/2014 04:20 PM    Clinical ASCVD: No  The ASCVD Risk score (Arnett DK, et al., 2019) failed to calculate for the following  reasons:   The valid total cholesterol range is 130 to 320 mg/dL       03/20/2022   11:36 AM 03/03/2022   10:49 AM 01/25/2022   12:09 PM  Depression screen PHQ 2/9  Decreased Interest 1 0 0  Down, Depressed, Hopeless '2 1 1  '$ PHQ - 2 Score '3 1 1  '$ Altered sleeping 1    Tired, decreased energy 2    Change in appetite 1    Feeling bad or failure about yourself  1    Trouble concentrating 1    Moving slowly or fidgety/restless 0    Suicidal thoughts 0    PHQ-9 Score 9    Difficult doing work/chores Somewhat difficult       Social History   Tobacco Use  Smoking Status Every Day   Packs/day: 1.00   Years: 40.00   Total pack years: 40.00   Types: Cigarettes, E-cigarettes   Start date: 1980  Smokeless Tobacco Current  Tobacco Comments   vape   BP Readings from Last 3 Encounters:  06/20/22 124/70  06/13/22 (!) 141/68  03/20/22 118/70   Pulse Readings from Last 3 Encounters:  06/20/22 87  06/13/22 81  03/20/22 79   Wt Readings from Last 3 Encounters:  06/20/22 272 lb 6.4 oz (123.6 kg)  06/09/22 275 lb (124.7 kg)  03/20/22 284 lb 12.8 oz (129.2 kg)   BMI Readings from Last 3 Encounters:  06/20/22 46.76 kg/m  06/09/22 47.20 kg/m  03/20/22 48.89 kg/m    Allergies  Allergen Reactions   Codeine Itching and Nausea Only   Metformin And Related Diarrhea, Nausea Only and Other (See Comments)    Stomach pain/nausea   Wellbutrin [Bupropion Hcl] Hives   Acyclovir And Related Rash    Medications Reviewed Today     Reviewed by Cloria Spring, MD (Psychiatrist) on 06/21/22 at [place] List Status: <None>   Medication Order Taking? Sig Documenting Provider Last Dose Status Informant  ACCU-CHEK GUIDE test strip [id number] No USE UP TO FOUR TIMES DAILY AS DIRECTED Ronnald Nian, DO Taking Active Self  Accu-Chek Softclix Lancets lancets [id number] No USE UP TO FOUR TIMES DAILY AS DIRECTED Cirigliano, Garvin Fila, DO Taking Active Self  albuterol (PROVENTIL) (2.5 MG/3ML)  0.083% nebulizer solution [id number] No Take 3 mLs (2.5 mg total) by nebulization every 6 (six) hours as needed for wheezing or shortness of breath. Kayleen Memos, DO Taking Active   albuterol (VENTOLIN HFA) 108 [phone] Base) MCG/ACT inhaler [id number]  Inhale  2 puffs into the lungs every 6 (six) hours as needed for wheezing or shortness of breath. Haydee Salter, MD  Active   amphetamine-dextroamphetamine (ADDERALL) 30 MG tablet [id number]  Take 1 tablet by mouth 2 (two) times daily. Cloria Spring, MD  Active   amphetamine-dextroamphetamine (ADDERALL) 30 MG tablet [id number] Yes Take 1 tablet by mouth 2 (two) times daily. Cloria Spring, MD  Active   amphetamine-dextroamphetamine (ADDERALL) 30 MG tablet [id number] Yes Take 1 tablet by mouth daily. Cloria Spring, MD  Active   ARIPiprazole (ABILIFY) 5 MG tablet [id number]  Take 1 tablet (5 mg total) by mouth daily. Cloria Spring, MD  Active   atorvastatin (LIPITOR) 40 MG tablet [id number] No TAKE 0.5 TABLETS (20 MG TOTAL) BY MOUTH 2 (TWO) TIMES DAILY. Haydee Salter, MD Taking Active   bismuth subsalicylate (PEPTO BISMOL) 262 MG/15ML suspension [id number] No Take 30 mLs by mouth every 6 (six) hours as needed for indigestion. [provider] Taking Active Self  blood glucose meter kit and supplies [id number] No Dispense based on patient and insurance preference. Use up to four times daily as directed. (FOR ICD-10 E10.9, E11.9). Ronnald Nian, DO Taking Active Self  clonazePAM (KLONOPIN) 0.5 MG tablet [id number] No Take 1 tablet (0.5 mg total) by mouth 2 (two) times daily as needed for anxiety. Cloria Spring, MD Taking Active Self  Dextromethorphan-guaiFENesin Trosky DM) 30-600 MG TB12 [id number] No Take 1 tablet by mouth every 12 (twelve) hours as needed (for coughing or to loosen mucus). [provider] Taking Active Self  DULoxetine (CYMBALTA) 60 MG capsule [id number]  Take 1 capsule (60 mg total) by mouth in the morning. Cloria Spring, MD  Active   FARXIGA 10 MG TABS tablet [id number] No TAKE 1 TABLET(10 MG) BY MOUTH DAILY  Patient taking differently: Take 10 mg by mouth daily.   Haydee Salter, MD Taking Active Self  fluticasone (FLONASE) 50 MCG/ACT nasal spray [id number] No PLACE 1 SPRAY INTO BOTH NOSTRILS 2 (TWO) TIMES DAILY AS NEEDED FOR ALLERGIES OR RHINITIS (AFTER SINUS RINSE).  Patient taking differently: Place 1 spray into both nostrils in the morning and at bedtime.   Haydee Salter, MD Taking Active Self  glipiZIDE (GLUCOTROL) 5 MG tablet [id number] No TAKE 1 TABLET(5 MG) BY MOUTH TWICE DAILY BEFORE A MEAL  Patient taking differently: Take 5 mg by mouth 2 (two) times daily before a meal.   Haydee Salter, MD Taking Active Self  ibuprofen (ADVIL) 200 MG tablet [id number] No Take 600 mg by mouth every 6 (six) hours as needed for mild pain or headache. [provider] Taking Active Self  insulin glargine (LANTUS) 100 UNIT/ML injection [id number]  Inject 0.2 mLs (20 Units total) into the skin daily with breakfast. Haydee Salter, MD  Active   Insulin Pen Needle (BD PEN NEEDLE NANO 2ND GEN) 32G X 4 MM MISC [id number] No USE DAILY WITH Santa Genera, MD Taking Active Self  Insulin Syringe-Needle U-100 (INSULIN SYRINGE .5CC/31GX5/16") 31G X 5/16" 0.5 ML MISC [id number] No USE AS DIRECTED ONCE DAILY Haydee Salter, MD Taking Active Self  ipratropium-albuterol (DUONEB) 0.5-2.5 (3) MG/3ML SOLN [id number]  Take 3 mLs by nebulization 3 (three) times daily. Haydee Salter, MD  Active   losartan (COZAAR) 25 MG tablet [id number] No TAKE 1 TABLET EVERY DAY Haydee Salter, MD Taking Active   metoprolol tartrate (LOPRESSOR) 25 MG tablet [id number] No TAKE 1 TABLET(25 MG)  BY MOUTH TWICE DAILY  Patient taking differently: Take 25 mg by mouth daily.   Donato Heinz, MD Taking Active Self  Nicotine (NICODERM CQ TD) [id number] No Place 1 patch onto the skin daily as needed (FOR SMOKING CESSATION WHILE  HOSPITALIZED).  Patient not taking: Reported on 06/20/2022   [provider] Not Taking Active Self           Med Note Duffy Bruce, Legrand Como   Fri Jun 09, 2022  4:22 PM) PROVIDER: The patient smokes 1.5 packs/day. Please determine the strength of the patch she will need during her stay.  nystatin (MYCOSTATIN/NYSTOP) powder [id number] No Apply 1 application topically 2 (two) times daily.  Patient taking differently: Apply 1 application  topically 2 (two) times daily as needed (rash).   [name], MD Taking Active Self  pantoprazole (PROTONIX) 40 MG tablet [id number] No TAKE 1 TABLET BY MOUTH TWICE A DAY  Patient taking differently: Take 40 mg by mouth 2 (two) times daily before a meal.   Haydee Salter, MD Taking Active Self  pregabalin (LYRICA) 150 MG capsule [id number] No TAKE 1 CAPSULE BY MOUTH TWICE A DAY Haydee Salter, MD Taking Active Self  Semaglutide, 2 MG/DOSE, 8 MG/3ML SOPN [id number] No Inject 2 mg as directed once a week.  Patient taking differently: Inject 2 mg as directed every Friday.   Haydee Salter, MD Taking Active Self            SDOH:  (Social Determinants of Health) assessments and interventions performed: Yes SDOH Interventions    Flowsheet Row Telephone from 06/14/2022 in [place] Office Visit from 11/06/2019 in [place] at [place] Interventions    Food Insecurity Interventions Intervention Not Indicated --  Housing Interventions Intervention Not Indicated --  Depression Interventions/Treatment  -- Counseling       Medication Assistance: None required.  Patient affirms current coverage meets needs. Patient is enrolled in [facility] extra help program.    Medication Access: Within the past 30 days, how often has patient missed a dose of medication? None Is a pillbox or other method used to improve adherence? Yes  Factors that may affect medication adherence? no barriers  identified Are meds synced by current pharmacy? No  Are meds delivered by current pharmacy? No  Does patient experience delays in picking up medications due to transportation concerns? No   [facility] Services Reviewed: Is patient disadvantaged to use [facility]?: No  Current Rx insurance plan: [employer] Name and [facility] of [id number]: [phone]: 3[address] [place] [facility] [place]Phone: [phone]: [phone] [facility] services reviewed with patient today?: No  Patient requests to transfer care to [facility]?: No  Reason patient declined to change pharmacies: Not mentioned at this visit  Compliance/Adherence/Medication fill history: Care Gaps: Annual wellness visit in last year? No last completed was 10/29/2020 Shingrix Vaccine   If Diabetic: Last eye exam / retinopathy screening:Last completed 05/25/2021 Last diabetic foot exam:last completed 06/20/2022  Star-Rating Drugs: Medication:    Atorvastatin 40 mg   Last Fill: [facility] Medication:    Glipizide 5 mg Last Fill: 06/25/2022 30 Day Supply at [employer] Medication:    Losartan 25 mg Last Fill:  06/16/2022 [facility]   Assessment/Plan   Hypertension (BP goal <130/80) -Controlled -Current treatment: Losartan 25 mg daily  -Medications previously tried: NA  -Denies hypotensive/hypertensive symptoms -Recommended to continue current medication  Hyperlipidemia: (LDL goal < 70) -Controlled -Current treatment: Atorvastatin 20 mg daily  -Medications previously tried: NA  -Recommended to continue current medication  Diabetes (A1c goal <7%) -Uncontrolled -Current medications: Farxiga 10 mg daily  Glipizide 5  mg twice daily  Lantus 20 units daily  Ozempic 2 mg weekly  -Medications previously tried: NA  -Current home glucose readings: not checking regularly.  -Denies hypoglycemic/hyperglycemic symptoms -Start documentation for CGM.  -Recommended to continue current medication  COPD (Goal: control symptoms and prevent exacerbations) -Uncontrolled -Current treatment  Albuterol Nebulizer  Albuterol HFA  DuoNeb Nebulizer  Flonase  Mucinex DM twice daily  -Medications previously tried: Advair, Spiriva   -Gold Grade: Gold 1 (FEV1>80%) -Current COPD Classification:  E (exacerbation leading to hospitalization OR 2+ moderate exacerbations) -MMRC score: 3. Patient reports continued symptoms of coughing, sputum production which is at her baseline. She notes dyspnea with exertion. Patient is still waiting to hear back regarding scheduling pulmonology referral.  -Pulmonary function testing: FEV1 84% (2021) -Exacerbations requiring treatment in last 6 months: Feb 2024 (Flu)  -Frequency of rescue inhaler use: few times monthly when not at home.  -Frequency of nebulizer inhaler use: 3-4 times weekly  -START Anoro 1 puff daily   Tobacco use (Goal Quit smoking) -Uncontrolled -Patient has been smoking 1-2 ppd for 43 year (80 yr+ pack year history)  -Previous quit attempts: Chantix (ineffective), Wellbutrin (Effective - Hives), Patch (ineffective at home) -Patient smokes Within 30 minutes of waking -Patient triggers include: anxiety and finishing a meal, caring for loved ones.  -Patient is in a contemplative state of smoking cessation. She wants to quit but was unable to set a quit date in the next 30 days due to everything going on in her and her husband's health.  -Recommended to continue current medication  Depression/Anxiety /ADHD(Goal: Achieve symptom remission) -Controlled -Current treatment: Adderall 30 mg twice daily  Aripiprazole '5mg'$  daily  Clonazepam 0.5 mg twice daily  Duloxetine 60 mg  daily  -Medications previously tried/failed: Buspar -Recommended to continue current medication  Junius Argyle, PharmD, Para March, CPP Clinical Pharmacist Practitioner  [facility]  [phone]

## 2022-07-10 ENCOUNTER — Telehealth: Payer: Self-pay

## 2022-07-10 NOTE — Progress Notes (Addendum)
Per Clinical pharmacist, please inform patient to reach out to [employer] to discuss her order for CGM at [phone].I left a voice message informing her of the above and my contact information if she has any questions.  [place] Pharmacist Assistant [phone]

## 2022-07-18 ENCOUNTER — Telehealth: Payer: Self-pay

## 2022-07-18 NOTE — Progress Notes (Signed)
Per Clinical pharmacist, please inform patient to reach out to [employer] to discuss her order for CGM at [phone].  Patient confirms speaking with [employer].Patient states she requested them to call her back on the 3rd of April because that is when she will have the co-pay that is needed.Notified Clinical pharmacist.  [place] Clinical Pharmacist Assistant [phone]

## 2022-07-27 ENCOUNTER — Telehealth: Payer: Self-pay

## 2022-07-27 NOTE — Telephone Encounter (Signed)
Care Management & Coordination Services Outreach Note  07/27/2022 Name: [name] MRN: [id number] DOB: October 30, 1965  Referred by: Haydee Salter, MD  Patient had a phone appointment scheduled with clinical pharmacist today.  An unsuccessful telephone outreach was attempted today. The patient was referred to the pharmacist for assistance with medications, care management and care coordination.   Patient will NOT be penalized in any way for missing a Care Management & Coordination Services appointment. The no-show fee does not apply.  Junius Argyle, PharmD, Para March, CPP Clinical Pharmacist Practitioner  [facility]  [phone]

## 2022-07-27 NOTE — Progress Notes (Unsigned)
Care Management & Coordination Services Pharmacy Note  07/27/2022 Name:  Keyunna Sedberry MRN:  [id number] DOB:  12-20-1965  Summary: Patient presents for initial pharmacy consult. A full medication reconciliation was not done today as patient did not bring her medications with her.   Patient has restarted meloxicam.   MMRC score: 3. Patient reports continued symptoms of coughing, sputum production which is at her baseline. She notes dyspnea with exertion. Patient is still waiting to hear back regarding scheduling pulmonology referral.   She also reports increased chest pain since home from the hospital. Chest pain with exertion. Rates pain between 4-7/10. She does also report increased reflux symptoms despite Protonix BID.   Recommendations/Changes made from today's visit: -START Anoro 1 puff daily  -Start documentation for CGM.   Follow up plan: CPP follow-up 1 month    Subjective: Aubrie Khamiah Benedicto is an 57 y.o. year old female who is a primary patient of Rudd, Lillette Boxer, MD.  The care coordination team was consulted for assistance with disease management and care coordination needs.    Engaged with patient face to face for initial visit.  Recent office visits: 06/20/2022 Dr. Gena Fray MD (PCP) No Medication changes noted 03/20/2022 Dr. Gena Fray MD (PCP) Change Semaglutide 2 mg injection weekly, Return in about 3 months   Recent consult visits: 06/21/2022 Dr. Harrington Challenger MD [facility]) Unable to see note 03/03/2022 Dr. Harrington Challenger MD ([place]) Unable to see note  Hospital visits: Admitted to the hospital on 06/09/2022 due to Acute Respiratory failure with hypoxia. Discharge date was 06/13/2022. Discharged from [facility].    Objective:  Lab Results  Component Value Date   CREATININE 0.58 06/20/2022   BUN 7 06/20/2022   GFR 101.25 06/20/2022   GFRNONAA >60 06/13/2022   GFRAA 94 10/29/2019   NA 140 06/20/2022   K 3.7 06/20/2022   CALCIUM 9.0  06/20/2022   CO2 30 06/20/2022   GLUCOSE 129 (H) 06/20/2022    Lab Results  Component Value Date/Time   HGBA1C 7.8 (H) 06/20/2022 11:27 AM   HGBA1C 7.1 (H) 03/20/2022 10:54 AM   HGBA1C 6.1 (H) 10/03/2013 04:55 PM   GFR 101.25 06/20/2022 11:27 AM   GFR 99.61 12/14/2021 11:38 AM   MICROALBUR <0.7 12/14/2021 11:38 AM   MICROALBUR <0.7 10/20/2020 04:38 PM   MICROALBUR 10 10/01/2017 05:15 PM    Last diabetic Eye exam:  Lab Results  Component Value Date/Time   HMDIABEYEEXA No Retinopathy 07/04/2019 12:00 AM    Last diabetic Foot exam: No results found for: "HMDIABFOOTEX"   Lab Results  Component Value Date   CHOL 119 12/14/2021   HDL 44.10 12/14/2021   LDLCALC 57 12/14/2021   LDLDIRECT 121 (H) 10/03/2013   TRIG 86.0 12/14/2021   CHOLHDL 3 12/14/2021       Latest Ref Rng & Units 06/09/2022   12:18 PM 06/03/2021    5:35 AM 05/31/2021    1:55 PM  Hepatic Function  Total Protein 6.5 - 8.1 g/dL 7.7  7.2  6.7   Albumin 3.5 - 5.0 g/dL 3.9  3.4  3.3   AST 15 - 41 U/L 46  17  18   ALT 0 - 44 U/L 29  20  18    Alk Phosphatase 38 - 126 U/L 59  56  59   Total Bilirubin 0.3 - 1.2 mg/dL 0.6  0.2  0.1     Lab Results  Component Value Date/Time   TSH 1.860 06/30/2019 01:45 PM  TSH 2.40 11/26/2018 02:33 PM   FREET4 1.17 06/30/2019 01:45 PM   FREET4 1.33 09/24/2018 12:00 PM       Latest Ref Rng & Units 06/13/2022    5:00 AM 06/12/2022    4:43 AM 06/11/2022    5:27 AM  CBC  WBC 4.0 - 10.5 K/uL 10.5  14.1  14.1   Hemoglobin 12.0 - 15.0 g/dL 13.1  13.7  13.9   Hematocrit 36.0 - 46.0 % 43.0  45.3  45.0   Platelets 150 - 400 K/uL 145  167  184     Lab Results  Component Value Date/Time   VD25OH 30.45 10/20/2020 04:38 PM   VD25OH 24.9 (L) 06/30/2019 01:45 PM   VD25OH 46.7 09/24/2018 12:00 PM   [id number] 06/30/2019 01:45 PM   [id number] 10/02/2014 04:20 PM    Clinical ASCVD: No  The ASCVD Risk score (Arnett DK, et al., 2019) failed to calculate for the following  reasons:   The valid total cholesterol range is 130 to 320 mg/dL       03/20/2022   11:36 AM 03/03/2022   10:49 AM 01/25/2022   12:09 PM  Depression screen PHQ 2/9  Decreased Interest 1 0 0  Down, Depressed, Hopeless 2 1 1   PHQ - 2 Score 3 1 1   Altered sleeping 1    Tired, decreased energy 2    Change in appetite 1    Feeling bad or failure about yourself  1    Trouble concentrating 1    Moving slowly or fidgety/restless 0    Suicidal thoughts 0    PHQ-9 Score 9    Difficult doing work/chores Somewhat difficult       Social History   Tobacco Use  Smoking Status Every Day   Packs/day: 1.00   Years: 40.00   Additional pack years: 0.00   Total pack years: 40.00   Types: Cigarettes, E-cigarettes   Start date: 1980  Smokeless Tobacco Current  Tobacco Comments   vape   BP Readings from Last 3 Encounters:  06/20/22 124/70  06/13/22 (!) 141/68  03/20/22 118/70   Pulse Readings from Last 3 Encounters:  06/20/22 87  06/13/22 81  03/20/22 79   Wt Readings from Last 3 Encounters:  06/20/22 272 lb 6.4 oz (123.6 kg)  06/09/22 275 lb (124.7 kg)  03/20/22 284 lb 12.8 oz (129.2 kg)   BMI Readings from Last 3 Encounters:  06/20/22 46.76 kg/m  06/09/22 47.20 kg/m  03/20/22 48.89 kg/m    Allergies  Allergen Reactions   Codeine Itching and Nausea Only   Metformin And Related Diarrhea, Nausea Only and Other (See Comments)    Stomach pain/nausea   Wellbutrin [Bupropion Hcl] Hives   Acyclovir And Related Rash    Medications Reviewed Today     Reviewed by Cloria Spring, MD (Psychiatrist) on 06/21/22 at [facility] Status: <None>   Medication Order Taking? Sig Documenting Provider Last Dose Status Informant  ACCU-CHEK GUIDE test strip [id number] No USE UP TO FOUR TIMES DAILY AS DIRECTED Ronnald Nian, DO Taking Active Self  Accu-Chek Softclix Lancets lancets [id number] No USE UP TO FOUR TIMES DAILY AS DIRECTED Cirigliano, Garvin Fila, DO Taking Active Self   albuterol (PROVENTIL) (2.5 MG/3ML) 0.083% nebulizer solution [id number] No Take 3 mLs (2.5 mg total) by nebulization every 6 (six) hours as needed for wheezing or shortness of breath. Kayleen Memos, DO Taking Active   albuterol (VENTOLIN HFA) 108 (90  Base) MCG/ACT inhaler [id number]  Inhale 2 puffs into the lungs every 6 (six) hours as needed for wheezing or shortness of breath. Haydee Salter, MD  Active   amphetamine-dextroamphetamine (ADDERALL) 30 MG tablet [id number]  Take 1 tablet by mouth 2 (two) times daily. Cloria Spring, MD  Active   amphetamine-dextroamphetamine (ADDERALL) 30 MG tablet [id number] Yes Take 1 tablet by mouth 2 (two) times daily. Cloria Spring, MD  Active   amphetamine-dextroamphetamine (ADDERALL) 30 MG tablet [id number] Yes Take 1 tablet by mouth daily. Cloria Spring, MD  Active   ARIPiprazole (ABILIFY) 5 MG tablet [id number]  Take 1 tablet (5 mg total) by mouth daily. Cloria Spring, MD  Active   atorvastatin (LIPITOR) 40 MG tablet [id number] No TAKE 0.5 TABLETS (20 MG TOTAL) BY MOUTH 2 (TWO) TIMES DAILY. Haydee Salter, MD Taking Active   bismuth subsalicylate (PEPTO BISMOL) 262 MG/15ML suspension [id number] No Take 30 mLs by mouth every 6 (six) hours as needed for indigestion. [provider] Taking Active Self  blood glucose meter kit and supplies [id number] No Dispense based on patient and insurance preference. Use up to four times daily as directed. (FOR ICD-10 E10.9, E11.9). Ronnald Nian, DO Taking Active Self  clonazePAM (KLONOPIN) 0.5 MG tablet [id number] No Take 1 tablet (0.5 mg total) by mouth 2 (two) times daily as needed for anxiety. Cloria Spring, MD Taking Active Self  Dextromethorphan-guaiFENesin [facility] DM) 30-600 MG TB12 [id number] No Take 1 tablet by mouth every 12 (twelve) hours as needed (for coughing or to loosen mucus). [provider] Taking Active Self  DULoxetine (CYMBALTA) 60 MG capsule [id number]  Take 1 capsule (60 mg total)  by mouth in the morning. Cloria Spring, MD  Active   FARXIGA 10 MG TABS tablet [id number] No TAKE 1 TABLET(10 MG) BY MOUTH DAILY  Patient taking differently: Take 10 mg by mouth daily.   Haydee Salter, MD Taking Active Self  fluticasone (FLONASE) 50 MCG/ACT nasal spray [id number] No PLACE 1 SPRAY INTO BOTH NOSTRILS 2 (TWO) TIMES DAILY AS NEEDED FOR ALLERGIES OR RHINITIS (AFTER SINUS RINSE).  Patient taking differently: Place 1 spray into both nostrils in the morning and at bedtime.   Haydee Salter, MD Taking Active Self  glipiZIDE (GLUCOTROL) 5 MG tablet [id number] No TAKE 1 TABLET(5 MG) BY MOUTH TWICE DAILY BEFORE A MEAL  Patient taking differently: Take 5 mg by mouth 2 (two) times daily before a meal.   Haydee Salter, MD Taking Active Self  ibuprofen (ADVIL) 200 MG tablet [id number] No Take 600 mg by mouth every 6 (six) hours as needed for mild pain or headache. [provider] Taking Active Self  insulin glargine (LANTUS) 100 UNIT/ML injection [id number]  Inject 0.2 mLs (20 Units total) into the skin daily with breakfast. Haydee Salter, MD  Active   Insulin Pen Needle (BD PEN NEEDLE NANO 2ND GEN) 32G X 4 MM MISC [id number] No USE DAILY WITH Santa Genera, MD Taking Active Self  Insulin Syringe-Needle U-100 (INSULIN SYRINGE .5CC/31GX5/16") 31G X 5/16" 0.5 ML MISC [id number] No USE AS DIRECTED ONCE DAILY Haydee Salter, MD Taking Active Self  ipratropium-albuterol (DUONEB) 0.5-2.5 (3) MG/3ML SOLN [id number]  Take 3 mLs by nebulization 3 (three) times daily. Haydee Salter, MD  Active   losartan (COZAAR) 25 MG tablet [id number] No TAKE 1 TABLET EVERY DAY Haydee Salter, MD Taking Active   metoprolol tartrate (LOPRESSOR) 25 MG tablet  [id number] No TAKE 1 TABLET(25 MG) BY MOUTH TWICE DAILY  Patient taking differently: Take 25 mg by mouth daily.   Donato Heinz, MD Taking Active Self  Nicotine (NICODERM CQ TD) [id number] No Place 1 patch onto the skin daily as needed  (FOR SMOKING CESSATION WHILE HOSPITALIZED).  Patient not taking: Reported on 06/20/2022   [provider] Not Taking Active Self           Med Note Duffy Bruce, Legrand Como   Fri Jun 09, 2022  4:22 PM) PROVIDER: The patient smokes 1.5 packs/day. Please determine the strength of the patch she will need during her stay.  nystatin (MYCOSTATIN/NYSTOP) powder [id number] No Apply 1 application topically 2 (two) times daily.  Patient taking differently: Apply 1 application  topically 2 (two) times daily as needed (rash).   [name], MD Taking Active Self  pantoprazole (PROTONIX) 40 MG tablet [id number] No TAKE 1 TABLET BY MOUTH TWICE A DAY  Patient taking differently: Take 40 mg by mouth 2 (two) times daily before a meal.   Haydee Salter, MD Taking Active Self  pregabalin (LYRICA) 150 MG capsule [id number] No TAKE 1 CAPSULE BY MOUTH TWICE A DAY Haydee Salter, MD Taking Active Self  Semaglutide, 2 MG/DOSE, 8 MG/3ML SOPN [id number] No Inject 2 mg as directed once a week.  Patient taking differently: Inject 2 mg as directed every Friday.   Haydee Salter, MD Taking Active Self            SDOH:  (Social Determinants of Health) assessments and interventions performed: Yes SDOH Interventions    [place] Coordination from 06/29/2022 in [place] Telephone from 06/14/2022 in [place] Coordination Office Visit from 11/06/2019 in [place] at [place] Interventions -- Intervention Not Indicated --  Housing Interventions -- Intervention Not Indicated --  Transportation Interventions Intervention Not Indicated -- --  Depression Interventions/Treatment  -- -- Counseling  Financial Strain Interventions Intervention Not Indicated -- --       Medication Assistance: None required.  Patient affirms current coverage meets needs. Patient is enrolled in [facility] extra help program.     Medication Access: Within the past 30 days, how often has patient missed a dose of medication? None Is a pillbox or other method used to improve adherence? Yes  Factors that may affect medication adherence? no barriers identified Are meds synced by current pharmacy? No  Are meds delivered by current pharmacy? No  Does patient experience delays in picking up medications due to transportation concerns? No   [facility] Reviewed: Is patient disadvantaged to use [facility]?: No  Current Rx insurance plan: [employer] Name and location of Current pharmacy:  [id number] [address] [address] Phone: [phone] Fax: [place], [place] [address] [facility] [address] [place] [address] Phone: [phone] Fax: [phone]  [facility] services reviewed with patient today?: No  Patient requests to transfer care to [facility]?: No  Reason patient declined to change pharmacies: Not mentioned at this visit  Compliance/Adherence/Medication fill history: Care Gaps: Annual wellness visit in last year? No last completed was 10/29/2020 Shingrix Vaccine   If Diabetic: Last eye exam / retinopathy screening:Last completed 05/25/2021 Last diabetic foot exam:last completed 06/20/2022  Star-[facility] Drugs: Medication:    Atorvastatin 40 mg  Last Fill: [facility]16/2022 [facility] Medication:    Glipizide 5 mg Last Fill: 06/25/2022 30 Day Supply at [facility]. Medication:    Losartan 25 mg Last Fill: 06/16/2022 [facility].   Assessment/Plan   Hypertension (BP goal <130/80) -Controlled -Current treatment: Losartan 25 mg daily  -Medications previously tried: NA  -Denies hypotensive/hypertensive symptoms -Recommended to continue current medication  Hyperlipidemia: (LDL goal <  70) -Controlled -Current treatment: Atorvastatin 20 mg daily  -Medications previously tried: NA  -Recommended to continue current medication  Diabetes (A1c goal <7%) -Uncontrolled -Current medications: Farxiga 10 mg daily  Glipizide 5 mg twice daily  Lantus 20 units daily  Ozempic 2 mg weekly  -Medications previously tried: NA  -Current home glucose readings: not checking regularly.  -Denies hypoglycemic/hyperglycemic symptoms -Start documentation for CGM.  -Recommended to continue current medication  COPD (Goal: control symptoms and prevent exacerbations) -Uncontrolled -Current treatment  Albuterol Nebulizer  Albuterol HFA  DuoNeb Nebulizer  Flonase  Mucinex DM twice daily  -Medications previously tried: Advair, Spiriva   -Gold Grade: Gold 1 (FEV1>80%) -Current COPD Classification:  E (exacerbation leading to hospitalization OR 2+ moderate exacerbations) -MMRC score: 3. Patient reports continued symptoms of coughing, sputum production which is at her baseline. She notes dyspnea with exertion. Patient is still waiting to hear back regarding scheduling pulmonology referral.  -Pulmonary function testing: FEV1 84% (2021) -Exacerbations requiring treatment in last 6 months: Feb 2024 (Flu)  -Frequency of rescue inhaler use: few times monthly when not at home.  -Frequency of nebulizer inhaler use: 3-4 times weekly  -START Anoro 1 puff daily   Tobacco use (Goal Quit smoking) -Uncontrolled -Patient has been smoking 1-2 ppd for 43 year (80 yr+ pack year history)  -Previous quit attempts: Chantix (ineffective), Wellbutrin (Effective - Hives), Patch (ineffective at home) -Patient smokes Within 30 minutes of waking -Patient triggers include: anxiety and finishing a meal, caring for loved ones.  -Patient is in a contemplative state of smoking cessation. She wants to quit but was unable to set a quit date in the next 30 days due to everything going on in her and her husband's health.   -Recommended to continue current medication  Depression/Anxiety /ADHD(Goal: Achieve symptom remission) -Controlled -Current treatment: Adderall 30 mg twice daily  Aripiprazole 5mg  daily  Clonazepam 0.5 mg twice daily  Duloxetine 60 mg daily  -Medications previously tried/failed: Buspar -Recommended to continue current medication  Junius Argyle, PharmD, [place], CPP Clinical Pharmacist Practitioner  [facility] at [facility]  [phone]

## 2022-08-01 DIAGNOSIS — E1142 Type 2 diabetes mellitus with diabetic polyneuropathy: Secondary | ICD-10-CM | POA: Diagnosis not present

## 2022-08-01 DIAGNOSIS — Z794 Long term (current) use of insulin: Secondary | ICD-10-CM | POA: Diagnosis not present

## 2022-08-21 ENCOUNTER — Telehealth: Payer: Self-pay | Admitting: Family Medicine

## 2022-08-21 NOTE — Telephone Encounter (Signed)
Called patient to schedule Medicare Annual Wellness Visit (AWV). Left message for patient to call back and schedule Medicare Annual Wellness Visit (AWV).  Last date of AWV: 10/30/19  Please schedule an appointment at any time with [facility] .  If any questions, please contact me at [phone].  Thank you ,  Rian Koon [facility] AWV direct phone # [phone] 

## 2022-08-22 ENCOUNTER — Other Ambulatory Visit: Payer: Self-pay | Admitting: Family Medicine

## 2022-08-22 DIAGNOSIS — E1165 Type 2 diabetes mellitus with hyperglycemia: Secondary | ICD-10-CM

## 2022-08-22 DIAGNOSIS — E1142 Type 2 diabetes mellitus with diabetic polyneuropathy: Secondary | ICD-10-CM

## 2022-08-29 ENCOUNTER — Other Ambulatory Visit: Payer: Self-pay | Admitting: Family Medicine

## 2022-08-29 DIAGNOSIS — E1142 Type 2 diabetes mellitus with diabetic polyneuropathy: Secondary | ICD-10-CM

## 2022-09-08 ENCOUNTER — Other Ambulatory Visit: Payer: Self-pay | Admitting: Family Medicine

## 2022-09-08 DIAGNOSIS — I10 Essential (primary) hypertension: Secondary | ICD-10-CM

## 2022-09-08 DIAGNOSIS — E782 Mixed hyperlipidemia: Secondary | ICD-10-CM

## 2022-09-19 ENCOUNTER — Ambulatory Visit (INDEPENDENT_AMBULATORY_CARE_PROVIDER_SITE_OTHER): Payer: Medicare HMO | Admitting: Family Medicine

## 2022-09-19 ENCOUNTER — Encounter: Payer: Self-pay | Admitting: Family Medicine

## 2022-09-19 VITALS — BP 122/74 | HR 83 | Temp 98.7°F | Ht 64.0 in | Wt 269.6 lb

## 2022-09-19 DIAGNOSIS — Z6841 Body Mass Index (BMI) 40.0 and over, adult: Secondary | ICD-10-CM

## 2022-09-19 DIAGNOSIS — Z7984 Long term (current) use of oral hypoglycemic drugs: Secondary | ICD-10-CM

## 2022-09-19 DIAGNOSIS — Z7985 Long-term (current) use of injectable non-insulin antidiabetic drugs: Secondary | ICD-10-CM | POA: Diagnosis not present

## 2022-09-19 DIAGNOSIS — I10 Essential (primary) hypertension: Secondary | ICD-10-CM

## 2022-09-19 DIAGNOSIS — F17200 Nicotine dependence, unspecified, uncomplicated: Secondary | ICD-10-CM

## 2022-09-19 DIAGNOSIS — E1142 Type 2 diabetes mellitus with diabetic polyneuropathy: Secondary | ICD-10-CM | POA: Diagnosis not present

## 2022-09-19 DIAGNOSIS — E782 Mixed hyperlipidemia: Secondary | ICD-10-CM | POA: Diagnosis not present

## 2022-09-19 DIAGNOSIS — Z794 Long term (current) use of insulin: Secondary | ICD-10-CM | POA: Diagnosis not present

## 2022-09-19 LAB — HEMOGLOBIN A1C: Hgb A1c MFr Bld: 6.6 % — ABNORMAL HIGH (ref 4.6–6.5)

## 2022-09-19 LAB — GLUCOSE, RANDOM: Glucose, Bld: 95 mg/dL (ref 70–99)

## 2022-09-19 NOTE — Assessment & Plan Note (Signed)
Maximum weight: 290 lbs (11/2021) Current weight: 269 lbs Weight change since last visit: -2 lbs Total weight loss: 21 lbs (7.2%)  Continued gradual weight loss. Ozempic likely plays a role in this, though Ms. Leman is starting to be more active. We will continue to follow.

## 2022-09-19 NOTE — Assessment & Plan Note (Signed)
I continue to advise smoking cessation and encouraged her to continue to make attempts.

## 2022-09-19 NOTE — Progress Notes (Signed)
[facility] [address] [place] [address] Dept: [phone] Dept Fax: [phone]  Chronic Care Office Visit  Subjective:    Patient ID: [name], female    DOB: 1965-09-03, 57 y.o..   MRN: [id number]  Chief Complaint  Patient presents with   Medical Management of Chronic Issues    3 month f/u.  No concerns.     History of Present Illness:  Patient is in today for reassessment of chronic medical issues.  [name] has a history of Type 2 diabetes. She is managed on dapagliflozin Marcelline Deist) 10 mg daily, glipizide 5 mg bid, insulin glargine 20 units daily and semaglutide (Ozempic) 2 mg weekly. Her diabetes is complicated by peripheral neuropathy. This is managed with Lyrica 150 mg bid.    [name] has a history of hypertension. She is managed on metoprolol 25 mg bid and losartan 25 mg daily.  [name] has a history of COPD with chronic bronchitis. She is managed on albuterol (inhaler and nebulizer) and Duoneb. She continues to smoke cigarettes, noting she has cut down to 1 ppd, havign previously been at 2 ppd. She feels her stamina is improving.  Past Medical History: Patient Active Problem List   Diagnosis Date Noted   AKI (acute kidney injury) (HCC) 06/09/2022   Coronary atherosclerosis 05/23/2022   Aortic atherosclerosis (HCC) 05/23/2022   Arthritis of left knee 12/14/2021   Acute respiratory failure with hypoxia (HCC) 06/02/2021   Marijuana use 03/14/2021   Allergic rhinitis 03/14/2021   Angina pectoris (HCC) 09/08/2019   Benzodiazepine overdose 03/05/2019   Generalized muscle ache 09/24/2018   Chronic right ear pain 03/11/2018   Chronic sinusitis 03/11/2018   H/O drug abuse (HCC) 11/12/2017   Positive for macroalbuminuria 11/12/2017   Atypical chest pain 10/05/2017   COPD with chronic bronchitis 10/01/2017   Mixed hyperlipidemia 10/01/2017   Tobacco use disorder 10/01/2017   Diabetic  peripheral neuropathy (HCC) 10/01/2017   Iron deficiency anemia 10/01/2017   Adult attention deficit hyperactivity disorder 01/14/2016   Major depressive disorder, recurrent severe without psychotic features (HCC)    Leukocytosis 03/25/2014   OSA on CPAP 04/11/2013   Perforated ear drum 05/22/2012   Hypokalemia 05/08/2012   Bell's palsy 01/23/2012   Carpal tunnel syndrome on both sides 01/11/2012   Oxygen dependent 10/16/2011   Vitamin D deficiency 07/06/2011   Asthmatic bronchitis 06/11/2011   Back pain 06/11/2011   Morbid obesity with BMI of 45.0-49.9, adult (HCC) 05/06/2011   Gastric erosions 12/05/2010   Essential hypertension 12/02/2010   Type 2 diabetes mellitus with polyneuropathy (HCC) 12/02/2010   COPD with acute exacerbation (HCC) 11/30/2010   GERD (gastroesophageal reflux disease) 09/08/2010   Esophageal dysphagia 09/08/2010   Past Surgical History:  Procedure Laterality Date   CESAREAN SECTION  1988; 1989   CHOLECYSTECTOMY OPEN  1990   COLONOSCOPY  09/16/2010   ONG:EXBMWU colon/small internal hemorrhoids   ESOPHAGOGASTRODUODENOSCOPY  09/16/2010   SLF: normal/mild gastritis   ESOPHAGOGASTRODUODENOSCOPY N/A 10/19/2014   Procedure: ESOPHAGOGASTRODUODENOSCOPY (EGD);  Surgeon: West Bali, MD;  Location: [facility];  Service: Endoscopy;  Laterality: N/A;  830   FRACTURE SURGERY     HYSTEROSCOPY WITH THERMACHOICE  01/17/2012   Procedure: HYSTEROSCOPY WITH THERMACHOICE;  Surgeon: Lazaro Arms, MD;  Location: [facility];  Service: Gynecology;  Laterality: N/A;  total therapy time: 9:13sec  D5W  18 ml in, D5W   18ml out, temperture 87degrees celcious   KIDNEY SURGERY  as child for blockages   LEFT HEART CATH AND CORONARY ANGIOGRAPHY N/A 09/08/2019   Procedure: LEFT HEART CATH AND CORONARY ANGIOGRAPHY;  Surgeon: [place]az[place], Peter M, MD;  Location: [facility] INVASIVE CV LAB;  Service: Cardiovascular;  Laterality: N/A;   TONSILLECTOMY     TUBAL LIGATION  1989   TYMPANOSTOMY TUBE  PLACEMENT Bilateral    "several times when I was a child"   uterine ablation     WRIST FRACTURE SURGERY Left 1995   Family History  Problem Relation Age of Onset   Heart attack Mother 21       deceased   Diabetes Mother    Breast cancer Mother 46       inflammatory breast ca   Heart failure Mother        oxygen dependence, nonsmoker   Heart disease Mother    Depression Mother    Cancer Mother        Breast   Hypertension Mother    Hyperlipidemia Mother    Sudden death Mother    Sleep apnea Mother    Obesity Mother    Heart attack Father 36       deceased, etoh use   Heart disease Father    Alcohol abuse Father    Depression Father    Heart failure Father    Sudden death Father    Ulcers Sister    Hypertension Sister    Heart failure Sister    Depression Sister    Anxiety disorder Sister    Liver disease Maternal Aunt 40       died while on liver transplant list   Liver disease Maternal Uncle    Cancer Paternal Aunt        Breast   Heart disease Maternal Grandmother    Heart attack Maternal Grandmother        premature CAD   Colon cancer Neg Hx    Outpatient Medications Prior to Visit  Medication Sig Dispense Refill   ACCU-CHEK GUIDE test strip USE UP TO FOUR TIMES DAILY AS DIRECTED 200 strip 11   Accu-Chek Softclix Lancets lancets USE UP TO FOUR TIMES DAILY AS DIRECTED 300 each 0   albuterol (PROVENTIL) (2.5 MG/3ML) 0.083% nebulizer solution Take 3 mLs (2.5 mg total) by nebulization every 6 (six) hours as needed for wheezing or shortness of breath. 75 mL 0   albuterol (VENTOLIN HFA) 108 (90 Base) MCG/ACT inhaler Inhale 2 puffs into the lungs every 6 (six) hours as needed for wheezing or shortness of breath. 8 g 6   amphetamine-dextroamphetamine (ADDERALL) 30 MG tablet Take 1 tablet by mouth 2 (two) times daily. 60 tablet 0   amphetamine-dextroamphetamine (ADDERALL) 30 MG tablet Take 1 tablet by mouth 2 (two) times daily. 60 tablet 0   amphetamine-dextroamphetamine  (ADDERALL) 30 MG tablet Take 1 tablet by mouth daily. 60 tablet 0   ARIPiprazole (ABILIFY) 5 MG tablet Take 1 tablet (5 mg total) by mouth daily. 90 tablet 1   atorvastatin (LIPITOR) 40 MG tablet TAKE 0.5 TABLETS (20 MG TOTAL) BY MOUTH 2 (TWO) TIMES DAILY. 90 tablet 3   bismuth subsalicylate (PEPTO BISMOL) 262 MG/15ML suspension Take 30 mLs by mouth every 6 (six) hours as needed for indigestion.     blood glucose meter kit and supplies Dispense based on patient and insurance preference. Use up to four times daily as directed. (FOR ICD-10 E10.9, E11.9). 1 each 0   clonazePAM (KLONOPIN) 0.5 MG tablet Take 1 tablet (0.5 mg  total) by mouth 2 (two) times daily as needed for anxiety. 60 tablet 2   dapagliflozin propanediol (FARXIGA) 10 MG TABS tablet TAKE 1 TABLET(10 MG) BY MOUTH DAILY 90 tablet 2   Dextromethorphan-guaiFENesin (MUCINEX DM) 30-600 MG TB12 Take 1 tablet by mouth every 12 (twelve) hours as needed (for coughing or to loosen mucus).     DULoxetine (CYMBALTA) 60 MG capsule Take 1 capsule (60 mg total) by mouth in the morning. 90 capsule 2   fluticasone (FLONASE) 50 MCG/ACT nasal spray PLACE 1 SPRAY INTO BOTH NOSTRILS 2 (TWO) TIMES DAILY AS NEEDED FOR ALLERGIES OR RHINITIS (AFTER SINUS RINSE). (Patient taking differently: Place 1 spray into both nostrils in the morning and at bedtime.) 48 mL 2   glipiZIDE (GLUCOTROL) 5 MG tablet TAKE 1 TABLET(5 MG) BY MOUTH TWICE DAILY BEFORE A MEAL 60 tablet 3   ibuprofen (ADVIL) 200 MG tablet Take 600 mg by mouth every 6 (six) hours as needed for mild pain or headache.     insulin glargine (LANTUS) 100 UNIT/ML injection Inject 0.2 mLs (20 Units total) into the skin daily with breakfast. 10 mL 11   Insulin Pen Needle (BD PEN NEEDLE NANO 2ND GEN) 32G X 4 MM MISC USE DAILY WITH VICTOZA 100 each 11   Insulin Syringe-Needle U-100 (INSULIN SYRINGE .5CC/31GX5/16") 31G X 5/16" 0.5 ML MISC USE AS DIRECTED ONCE DAILY 100 each 2   ipratropium-albuterol (DUONEB) 0.5-2.5  (3) MG/3ML SOLN Take 3 mLs by nebulization 3 (three) times daily. 360 mL 3   losartan (COZAAR) 25 MG tablet TAKE 1 TABLET DAILY 90 tablet 3   meloxicam (MOBIC) 15 MG tablet Take 15 mg by mouth daily.     metoprolol tartrate (LOPRESSOR) 25 MG tablet TAKE 1 TABLET(25 MG) BY MOUTH TWICE DAILY (Patient taking differently: Take 25 mg by mouth daily.) 180 tablet 0   nystatin (MYCOSTATIN/NYSTOP) powder Apply 1 application topically 2 (two) times daily. (Patient taking differently: Apply 1 application  topically 2 (two) times daily as needed (rash).) 15 g 3   OZEMPIC, 2 MG/DOSE, 8 MG/3ML SOPN INJECT 2MG  AS DIRECTED ONCE A WEEK 3 mL 1   pantoprazole (PROTONIX) 40 MG tablet TAKE 1 TABLET BY MOUTH TWICE A DAY (Patient taking differently: Take 40 mg by mouth 2 (two) times daily before a meal.) 60 tablet 11   pregabalin (LYRICA) 150 MG capsule TAKE 1 CAPSULE BY MOUTH TWICE A DAY 180 capsule 5   umeclidinium-vilanterol (ANORO ELLIPTA) 62.5-25 MCG/ACT AEPB Inhale 1 puff into the lungs daily. 30 each 11   No facility-administered medications prior to visit.   Allergies  Allergen Reactions   Codeine Itching and Nausea Only   Metformin And Related Diarrhea, Nausea Only and Other (See Comments)    Stomach pain/nausea   Wellbutrin [Bupropion Hcl] Hives   Acyclovir And Related Rash   Objective:   Today's Vitals   09/19/22 1332  BP: 122/74  Pulse: 83  Temp: 98.7 F (37.1 C)  TempSrc: Temporal  SpO2: 96%  Weight: 269 lb 9.6 oz (122.3 kg)  Height: 5\' 4"  (1.626 m)   Body mass index is 46.28 kg/m.   General: Well developed, well nourished. No acute distress. HEENT: Normocephalic, non-traumatic. External ears normal. EAC and TMs normal bilaterally. PERRL, EOMI. Conjunctiva clear. Nose   clear without congestion or rhinorrhea. Mucous membranes moist. Oropharynx clear. Good dentition. Neck: Supple. No lymphadenopathy. No thyromegaly. Lungs: Clear to auscultation bilaterally. No wheezing, rales or  rhonchi. CV: RRR without murmurs or rubs.  Pulses 2+ bilaterally. Abdomen: Soft, non-tender. Bowel sounds positive, normal pitch and frequency. No hepatosplenomegaly. No rebound or guarding. Back: Straight. No CVA tenderness bilaterally. Extremities: Full ROM. No joint swelling or tenderness. No edema noted. Skin: Warm and dry. No rashes. Neuro: CN II-XII intact. Normal sensation and DTR bilaterally. Psych: Alert and oriented. Normal mood and affect.  Health Maintenance Due  Topic Date Due   Zoster Vaccines- Shingrix (1 of 2) Never done   Medicare Annual Wellness (AWV)  10/29/2020   OPHTHALMOLOGY EXAM  05/25/2021   Imaging: Low Dose CT for Lung Cancer Screening (05/22/2022) IMPRESSION: 1. Lung-RADS 2, benign appearance or behavior. Continue annual screening with low-dose chest CT without contrast in 12 months. 2. Three-vessel coronary atherosclerosis. 3. Aortic Atherosclerosis (ICD10-I70.0) and Emphysema (ICD10-J43.9).  Lab Results    Latest Ref Rng & Units 06/20/2022   11:27 AM 06/13/2022    5:00 AM 06/12/2022    4:43 AM  CMP  Glucose 70 - 99 mg/dL 161  74  096   BUN 6 - 23 mg/dL 7  15  19    Creatinine 0.40 - 1.20 mg/dL 0.45  4.09  8.11   Sodium 135 - 145 mEq/L 140  138  137   Potassium 3.5 - 5.1 mEq/L 3.7  3.1  4.4   Chloride 96 - 112 mEq/L 100  98  97   CO2 19 - 32 mEq/L 30  31  29    Calcium 8.4 - 10.5 mg/dL 9.0  8.7  9.3    Last lipids Lab Results  Component Value Date   CHOL 119 12/14/2021   HDL 44.10 12/14/2021   LDLCALC 57 12/14/2021   LDLDIRECT 121 (H) 10/03/2013   TRIG 86.0 12/14/2021   CHOLHDL 3 12/14/2021   Last hemoglobin A1c Lab Results  Component Value Date   HGBA1C 7.8 (H) 06/20/2022   Assessment & Plan:   Problem List Items Addressed This Visit       Cardiovascular and Mediastinum   Essential hypertension - Primary    Blood pressure is at goal. Continue metoprolol 25 mg bid and losartan 25 mg daily.        Endocrine   Type 2 diabetes  mellitus with polyneuropathy (HCC)    I will check her A1c today. Continue dapagliflozin (Farxiga) 10 mg daily, glipizide 5 mg bid, insulin glargine 20 units daily and semaglutide (Ozempic) 2 mg weekly.      Relevant Orders   Glucose, random   Hemoglobin A1c     Other   Tobacco use disorder (Chronic)    I continue to advise smoking cessation and encouraged her to continue to make attempts.      Morbid obesity with BMI of 45.0-49.9, adult (HCC)    Maximum weight: 290 lbs (11/2021) Current weight: 269 lbs Weight change since last visit: -2 lbs Total weight loss: 21 lbs (7.2%)  Continued gradual weight loss. Ozempic likely plays a role in this, though [name] is starting to be more active. We will continue to follow.      Mixed hyperlipidemia    Lipids are at goal. Continue atorvastatin 40 mg daily.       Return in about 3 months (around 12/20/2022) for Reassessment.   Loyola Mast, MD

## 2022-09-19 NOTE — Assessment & Plan Note (Signed)
I will check her A1c today. Continue dapagliflozin (Farxiga) 10 mg daily, glipizide 5 mg bid, insulin glargine 20 units daily and semaglutide (Ozempic) 2 mg weekly.

## 2022-09-19 NOTE — Assessment & Plan Note (Signed)
Blood pressure is at goal. Continue metoprolol 25 mg bid and losartan 25 mg daily. 

## 2022-09-19 NOTE — Assessment & Plan Note (Signed)
Lipids are at goal. Continue atorvastatin 40 mg daily.

## 2022-09-21 ENCOUNTER — Telehealth: Payer: Self-pay

## 2022-09-21 NOTE — Progress Notes (Unsigned)
Care Management & Coordination Services Pharmacy Note  09/21/2022 Name:  [name] MRN:  [id number] DOB:  03/27/1966  Summary: Patient presents for initial pharmacy consult. A full medication reconciliation was not done today as patient did not bring her medications with her.   Patient has restarted meloxicam.   MMRC score: 3. Patient reports continued symptoms of coughing, sputum production which is at her baseline. She notes dyspnea with exertion. Patient is still waiting to hear back regarding scheduling pulmonology referral.   She also reports increased chest pain since home from the hospital. Chest pain with exertion. Rates pain between 4-7/10. She does also report increased reflux symptoms despite Protonix BID.   Recommendations/Changes made from today's visit: -START Anoro 1 puff daily  -Start documentation for CGM.   Follow up plan: CPP follow-up 1 month    Subjective: [name] is an 57 y.o. year old female who is a primary patient of Rudd, Bertram Millard, MD.  The care coordination team was consulted for assistance with disease management and care coordination needs.    Engaged with patient face to face for initial visit.  Recent office visits: 06/20/2022 Dr. Veto Kemps MD (PCP) No Medication changes noted 03/20/2022 Dr. Veto Kemps MD (PCP) Change Semaglutide 2 mg injection weekly, Return in about 3 months   Recent consult visits: 06/21/2022 Dr. Tenny Craw MD [facility]) Unable to see note 03/03/2022 Dr. Tenny Craw MD ([facility]al [facility]) Unable to see note  Hospital visits: Admitted to the hospital on 06/09/2022 due to Acute Respiratory failure with hypoxia. Discharge date was 06/13/2022. [facility] from [facility].    Objective:  Lab Results  Component Value Date   CREATININE 0.58 06/20/2022   BUN 7 06/20/2022   GFR 101.25 06/20/2022   GFRNONAA >60 06/13/2022   GFRAA 94 10/29/2019   NA 140 06/20/2022   K 3.7 06/20/2022   CALCIUM 9.0  06/20/2022   CO2 30 06/20/2022   GLUCOSE 95 09/19/2022    Lab Results  Component Value Date/Time   HGBA1C 6.6 (H) 09/19/2022 02:01 PM   HGBA1C 7.8 (H) 06/20/2022 11:27 AM   HGBA1C 6.1 (H) 10/03/2013 04:55 PM   GFR 101.25 06/20/2022 11:27 AM   GFR 99.61 12/14/2021 11:38 AM   MICROALBUR <0.7 12/14/2021 11:38 AM   MICROALBUR <0.7 10/20/2020 04:38 PM   MICROALBUR 10 10/01/2017 05:15 PM    Last diabetic Eye exam:  Lab Results  Component Value Date/Time   HMDIABEYEEXA No Retinopathy 07/04/2019 12:00 AM    Last diabetic Foot exam: No results found for: "HMDIABFOOTEX"   Lab Results  Component Value Date   CHOL 119 12/14/2021   HDL 44.10 12/14/2021   LDLCALC 57 12/14/2021   LDLDIRECT 121 (H) 10/03/2013   TRIG 86.0 12/14/2021   CHOLHDL 3 12/14/2021       Latest Ref Rng & Units 06/09/2022   12:18 PM 06/03/2021    5:35 AM 05/31/2021    1:55 PM  Hepatic Function  Total Protein 6.5 - 8.1 g/dL 7.7  7.2  6.7   Albumin 3.5 - 5.0 g/dL 3.9  3.4  3.3   AST 15 - 41 U/L 46  17  18   ALT 0 - 44 U/L 29  20  18    Alk Phosphatase 38 - 126 U/L 59  56  59   Total Bilirubin 0.3 - 1.2 mg/dL 0.6  0.2  0.1     Lab Results  Component Value Date/Time   TSH 1.860 06/30/2019 01:45 PM  TSH 2.40 11/26/2018 02:33 PM   FREET4 1.17 06/30/2019 01:45 PM   FREET4 1.33 09/24/2018 12:00 PM       Latest Ref Rng & Units 06/13/2022    5:00 AM 06/12/2022    4:43 AM 06/11/2022    5:27 AM  CBC  WBC 4.0 - 10.5 K/uL 10.5  14.1  14.1   Hemoglobin 12.0 - 15.0 g/dL 40.9  81.1  91.4   Hematocrit 36.0 - 46.0 % 43.0  45.3  45.0   Platelets 150 - 400 K/uL 145  167  184     Lab Results  Component Value Date/Time   VD25OH 30.45 10/20/2020 04:38 PM   VD25OH 24.9 (L) 06/30/2019 01:45 PM   VD25OH 46.7 09/24/2018 12:00 PM   VITAMINB12 486 06/30/2019 01:45 PM   VITAMINB12 213 10/02/2014 04:20 PM    Clinical ASCVD: No  The ASCVD Risk score (Arnett DK, et al., 2019) failed to calculate for the following reasons:    The valid total cholesterol range is 130 to 320 mg/dL       7/82/9562    1:30 PM 03/20/2022   11:36 AM 03/03/2022   10:49 AM  Depression screen PHQ 2/9  Decreased Interest 0 1 0  Down, Depressed, Hopeless 0 2 1  PHQ - 2 Score 0 3 1  Altered sleeping 0 1   Tired, decreased energy 1 2   Change in appetite 0 1   Feeling bad or failure about yourself  0 1   Trouble concentrating 1 1   Moving slowly or fidgety/restless 0 0   Suicidal thoughts 0 0   PHQ-9 Score 2 9   Difficult doing work/chores Not difficult at all Somewhat difficult      Social History   Tobacco Use  Smoking Status Every Day   Packs/day: 1.50   Years: 40.00   Additional pack years: 0.00   Total pack years: 60.00   Types: Cigarettes, E-cigarettes   Start date: 1980  Smokeless Tobacco Current  Tobacco Comments   vape   BP Readings from Last 3 Encounters:  09/19/22 122/74  06/20/22 124/70  06/13/22 (!) 141/68   Pulse Readings from Last 3 Encounters:  09/19/22 83  06/20/22 87  06/13/22 81   Wt Readings from Last 3 Encounters:  09/19/22 269 lb 9.6 oz (122.3 kg)  06/20/22 272 lb 6.4 oz (123.6 kg)  06/09/22 275 lb (124.7 kg)   BMI Readings from Last 3 Encounters:  09/19/22 46.28 kg/m  06/20/22 46.76 kg/m  06/09/22 47.20 kg/m    Allergies  Allergen Reactions   Codeine Itching and Nausea Only   Metformin And Related Diarrhea, Nausea Only and Other (See Comments)    Stomach pain/nausea   Wellbutrin [Bupropion Hcl] Hives   Acyclovir And Related Rash    Medications Reviewed Today     Reviewed by Loyola Mast, MD (Physician) on 09/19/22 at 1452  Med List Status: <None>   Medication Order Taking? Sig Documenting Provider Last Dose Status Informant  ACCU-CHEK GUIDE test strip [id number] Yes USE UP TO FOUR TIMES DAILY AS DIRECTED Overton Mam, DO Taking Active Self  Accu-Chek Softclix Lancets lancets [id number] Yes USE UP TO FOUR TIMES DAILY AS DIRECTED Overton Mam, DO Taking Active  Self  albuterol (PROVENTIL) (2.5 MG/3ML) 0.083% nebulizer solution [id number] Yes Take 3 mLs (2.5 mg total) by nebulization every 6 (six) hours as needed for wheezing or shortness of breath. Darlin Drop, DO Taking Active   albuterol (  VENTOLIN HFA) 108 (90 Base) MCG/ACT inhaler [id number] Yes Inhale 2 puffs into the lungs every 6 (six) hours as needed for wheezing or shortness of breath. Loyola Mast, MD Taking Active   amphetamine-dextroamphetamine (ADDERALL) 30 MG tablet [id number] Yes Take 1 tablet by mouth 2 (two) times daily. Myrlene Broker, MD Taking Active   amphetamine-dextroamphetamine (ADDERALL) 30 MG tablet [id number] Yes Take 1 tablet by mouth 2 (two) times daily. Myrlene Broker, MD Taking Active   amphetamine-dextroamphetamine (ADDERALL) 30 MG tablet [id number] Yes Take 1 tablet by mouth daily. Myrlene Broker, MD Taking Active   ARIPiprazole (ABILIFY) 5 MG tablet [id number] Yes Take 1 tablet (5 mg total) by mouth daily. Myrlene Broker, MD Taking Active   atorvastatin (LIPITOR) 40 MG tablet [id number] Yes TAKE 0.5 TABLETS (20 MG TOTAL) BY MOUTH 2 (TWO) TIMES DAILY. Loyola Mast, MD Taking Active   bismuth subsalicylate (PEPTO BISMOL) 262 MG/15ML suspension [id number] Yes Take 30 mLs by mouth every 6 (six) hours as needed for indigestion. [provider] Taking Active Self  blood glucose meter kit and supplies [id number] Yes Dispense based on patient and insurance preference. Use up to four times daily as directed. (FOR ICD-10 E10.9, E11.9). Overton Mam, DO Taking Active Self  clonazePAM (KLONOPIN) 0.5 MG tablet [id number] Yes Take 1 tablet (0.5 mg total) by mouth 2 (two) times daily as needed for anxiety. Myrlene Broker, MD Taking Active   dapagliflozin propanediol (FARXIGA) 10 MG TABS tablet [id number] Yes TAKE 1 TABLET(10 MG) BY MOUTH DAILY Loyola Mast, MD Taking Active   Dextromethorphan-guaiFENesin [facility]) 30-600 MG TB12 [id number] Yes Take 1 tablet by mouth  every 12 (twelve) hours as needed (for coughing or to loosen mucus). [provider] Taking Active Self  DULoxetine (CYMBALTA) 60 MG capsule [id number] Yes Take 1 capsule (60 mg total) by mouth in the morning. Myrlene Broker, MD Taking Active   fluticasone [facility]) 50 MCG/ACT nasal spray [id number] Yes PLACE 1 SPRAY INTO BOTH NOSTRILS 2 (TWO) TIMES DAILY AS NEEDED FOR ALLERGIES OR RHINITIS (AFTER SINUS RINSE).  Patient taking differently: Place 1 spray into both nostrils in the morning and at bedtime.   Loyola Mast, MD Taking Active Self  glipiZIDE (GLUCOTROL) 5 MG tablet [id number] Yes TAKE 1 TABLET(5 MG) BY MOUTH TWICE DAILY BEFORE A MEAL Rudd, Bertram Millard, MD Taking Active   ibuprofen (ADVIL) 200 MG tablet [id number] Yes Take 600 mg by mouth every 6 (six) hours as needed for mild pain or headache. [provider] Taking Active Self  insulin glargine (LANTUS) 100 UNIT/ML injection [id number] Yes Inject 0.2 mLs (20 Units total) into the skin daily with breakfast. Loyola Mast, MD Taking Active   Insulin Pen Needle (BD PEN NEEDLE NANO 2ND GEN) 32G X 4 MM MISC [id number] Yes USE DAILY WITH Malon Kindle, MD Taking Active Self  Insulin Syringe-Needle U-100 (INSULIN SYRINGE .5CC/31GX5/16") 31G X 5/16" 0.5 ML MISC [id number] Yes USE AS DIRECTED ONCE DAILY Loyola Mast, MD Taking Active Self  ipratropium-albuterol (DUONEB) 0.5-2.5 (3) MG/3ML SOLN [id number] Yes Take 3 mLs by nebulization 3 (three) times daily. Loyola Mast, MD Taking Active   losartan (COZAAR) 25 MG tablet [id number] Yes TAKE 1 TABLET DAILY Loyola Mast, MD Taking Active   meloxicam [facility]) 15 MG tablet [id number] Yes Take 15 mg by mouth daily. [provider] Taking Active   metoprolol tartrate (LOPRESSOR) 25 MG tablet [id number] Yes TAKE 1 TABLET(25  MG) BY MOUTH TWICE DAILY  Patient taking differently: Take 25 mg by mouth daily.   Little Ishikawa, MD Taking Active Self  nystatin  (MYCOSTATIN/NYSTOP) powder [id number] Yes Apply 1 application topically 2 (two) times daily.  Patient taking differently: Apply 1 application  topically 2 (two) times daily as needed (rash).   Genia Del, MD Taking Active Self  OZEMPIC, 2 MG/DOSE, 8 MG/3ML Namon Cirri [id number] Yes INJECT 2MG  AS DIRECTED ONCE A WEEK Loyola Mast, MD Taking Active   pantoprazole (PROTONIX) 40 MG tablet [id number] Yes TAKE 1 TABLET BY MOUTH TWICE A DAY  Patient taking differently: Take 40 mg by mouth 2 (two) times daily before a meal.   Loyola Mast, MD Taking Active Self  pregabalin (LYRICA) 150 MG capsule [id number] Yes TAKE 1 CAPSULE BY MOUTH TWICE A DAY Loyola Mast, MD Taking Active Self  umeclidinium-vilanterol [facility] ELLIPTA) 62.5-25 MCG/ACT AEPB [id number] Yes Inhale 1 puff into the lungs daily. Loyola Mast, MD Taking Active             SDOH:  (Social Determinants of Health) assessments and interventions performed: Yes SDOH Interventions    Flowsheet Row Care Coordination from 06/29/2022 in [facility] Telephone from 06/14/2022 in [facility] Care Coordination Office Visit from 11/06/2019 in [facility] [employer]  SDOH Interventions     Food Insecurity Interventions -- Intervention Not Indicated --  Housing Interventions -- Intervention Not Indicated --  Transportation Interventions Intervention Not Indicated -- --  Depression Interventions/Treatment  -- -- Counseling  Financial Strain Interventions Intervention Not Indicated -- --       Medication Assistance: None required.  Patient affirms current coverage meets needs. Patient is enrolled in [facility] extra help program.    Medication Access: Within the past 30 days, how often has patient missed a dose of medication? None Is a pillbox or other method used to improve adherence? Yes  Factors that may affect medication adherence? no barriers identified Are meds synced by  current pharmacy? No  Are meds delivered by current pharmacy? No  Does patient experience delays in picking up medications due to transportation concerns? No   [facility] Reviewed: Is patient disadvantaged to use [facility]?: No  Current Rx insurance plan: [employer] Name and location of [facility] pharmacy[facility]  [phone] [place], [place] - [address] [place] [address] Phone: [phone] Fax: [phone]  [id number] [place], [place] - [address] AT [facility] [address] [place] [place] [address] Phone: [phone] Fax: [phone]  [facility] services reviewed with patient today?: No  Patient requests to transfer care to [facility]?: No  Reason patient declined to change pharmacies: Not mentioned at this visit  Compliance/Adherence/Medication fill history: Care Gaps: Annual wellness visit in last year? No last completed was 10/29/2020 Shingrix Vaccine   If Diabetic: Last eye exam / retinopathy screening:Last completed 05/25/2021 Last diabetic foot exam:last completed 06/20/2022  [facility]: Medication:    Atorvastatin 40 mg   Last Fill: [facility]16[facility] Medication:    Glipizide 5 mg Last Fill: 06/25/2022 30 Day Supply at [facility]. Medication:    Losartan 25 mg Last Fill: 06/16/2022 [facility]   Assessment/Plan   Hypertension (BP goal <130/80) -Controlled -Current treatment: Losartan 25 mg daily  -Medications previously tried: NA  -Denies hypotensive/hypertensive symptoms -Recommended to continue current medication  Hyperlipidemia: (LDL goal <  70) -Controlled -Current treatment: Atorvastatin 20 mg daily  -Medications previously tried: NA  -Recommended to continue current medication  Diabetes (A1c goal <7%) -Uncontrolled -Current medications: Farxiga 10 mg daily  Glipizide 5 mg twice daily  Lantus 20 units  daily  Ozempic 2 mg weekly  -Medications previously tried: NA  -Current home glucose readings: not checking regularly.  -Denies hypoglycemic/hyperglycemic symptoms -Start documentation for CGM.  -Recommended to continue current medication  COPD (Goal: control symptoms and prevent exacerbations) -Uncontrolled -Current treatment  Albuterol Nebulizer  Albuterol HFA  DuoNeb Nebulizer  Flonase  Mucinex DM twice daily  -Medications previously tried: Advair, Spiriva   -Gold Grade: Gold 1 (FEV1>80%) -Current COPD Classification:  E (exacerbation leading to hospitalization OR 2+ moderate exacerbations) -MMRC score: 3. Patient reports continued symptoms of coughing, sputum production which is at her baseline. She notes dyspnea with exertion. Patient is still waiting to hear back regarding scheduling pulmonology referral.  -Pulmonary function testing: FEV1 84% (2021) -Exacerbations requiring treatment in last 6 months: Feb 2024 (Flu)  -Frequency of rescue inhaler use: few times monthly when not at home.  -Frequency of nebulizer inhaler use: 3-4 times weekly  -START Anoro 1 puff daily   Tobacco use (Goal Quit smoking) -Uncontrolled -Patient has been smoking 1-2 ppd for 43 year (80 yr+ pack year history)  -Previous quit attempts: Chantix (ineffective), Wellbutrin (Effective - Hives), Patch (ineffective at home) -Patient smokes Within 30 minutes of waking -Patient triggers include: anxiety and finishing a meal, caring for loved ones.  -Patient is in a contemplative state of smoking cessation. She wants to quit but was unable to set a quit date in the next 30 days due to everything going on in her and her husband's health.  -Recommended to continue current medication  Depression/Anxiety /ADHD(Goal: Achieve symptom remission) -Controlled -Current treatment: Adderall 30 mg twice daily  Aripiprazole 5mg  daily  Clonazepam 0.5 mg twice daily  Duloxetine 60 mg daily  -Medications previously  tried/failed: Buspar -Recommended to continue current medication  Angelena Sole, PharmD, Patsy Baltimore, CPP Clinical Pharmacist Practitioner  [facility] at [facility]  [phone]

## 2022-09-21 NOTE — Telephone Encounter (Signed)
Care Management & Coordination Services Outreach Note  09/21/2022 Name: [name] MRN: [id number] DOB: 12/15/1965  Referred by: Loyola Mast, MD  Patient had a phone appointment scheduled with clinical pharmacist today.  An unsuccessful telephone outreach was attempted today. The patient was referred to the pharmacist for assistance with medications, care management and care coordination.   Patient will NOT be penalized in any way for missing a Care Management & Coordination Services appointment. The no-show fee does not apply.  Angelena Sole, PharmD, Patsy Baltimore, CPP Clinical Pharmacist Practitioner  [facility]  [phone]

## 2022-09-22 ENCOUNTER — Telehealth: Payer: Self-pay

## 2022-09-22 NOTE — Progress Notes (Unsigned)
I reach out to patient to reschedule her no show appointment that was on 09/21/2022.

## 2022-09-27 ENCOUNTER — Other Ambulatory Visit: Payer: Self-pay | Admitting: Family Medicine

## 2022-09-27 ENCOUNTER — Telehealth (HOSPITAL_COMMUNITY): Payer: Self-pay

## 2022-09-27 DIAGNOSIS — E1142 Type 2 diabetes mellitus with diabetic polyneuropathy: Secondary | ICD-10-CM

## 2022-09-27 NOTE — Telephone Encounter (Signed)
Pt called in needing a refill on her amphetamine-dextroamphetamine (ADDERALL) 30 MG tablet  [facility] [address] only has 1 pill left. Pt is scheduled with Dr Tenny Craw on 10/02/22. Please advise as Dr Tenny Craw is out today.

## 2022-09-28 ENCOUNTER — Telehealth (HOSPITAL_COMMUNITY): Payer: Self-pay | Admitting: *Deleted

## 2022-09-28 ENCOUNTER — Other Ambulatory Visit (HOSPITAL_COMMUNITY): Payer: Self-pay | Admitting: Psychiatry

## 2022-09-28 MED ORDER — AMPHETAMINE-DEXTROAMPHETAMINE 30 MG PO TABS: 30.0000 mg | ORAL_TABLET | Freq: Two times a day (BID) | ORAL | 0 refills | Status: DC

## 2022-09-28 NOTE — Telephone Encounter (Signed)
The last rx they received said Take 1 tablet by mouth daily. - Oral  to fill after 08/19/22. The new rx says 1 twice daily

## 2022-09-28 NOTE — Telephone Encounter (Signed)
Sent today

## 2022-09-28 NOTE — Telephone Encounter (Signed)
sent 

## 2022-09-28 NOTE — Telephone Encounter (Signed)
Spoke with pt advised medication has been sent to the pharmacy pt verbalized understanding

## 2022-09-28 NOTE — Telephone Encounter (Signed)
Patient called stating she would like refills for her [facility] [address] in [place].

## 2022-09-28 NOTE — Telephone Encounter (Signed)
Pt called in stating that she is not able to get amphetamine-dextroamphetamine (ADDERALL) 30 MG tablet prescription that was sent in. I contacted pharmacy they are saying that it's a lock on the rx and can't be filled until 10/22/22. and they are needing a new rx to be sent in. Please advise.

## 2022-09-29 NOTE — Telephone Encounter (Signed)
Spoke with pharmacy they said pt picked it up yesterday with the correct sig

## 2022-09-29 NOTE — Telephone Encounter (Signed)
It is supposed to be twice a day

## 2022-10-02 ENCOUNTER — Telehealth (HOSPITAL_COMMUNITY): Payer: Medicare HMO | Admitting: Psychiatry

## 2022-10-02 ENCOUNTER — Encounter (HOSPITAL_COMMUNITY): Payer: Self-pay

## 2022-10-03 ENCOUNTER — Encounter (HOSPITAL_COMMUNITY): Payer: Self-pay | Admitting: Psychiatry

## 2022-10-03 ENCOUNTER — Telehealth (INDEPENDENT_AMBULATORY_CARE_PROVIDER_SITE_OTHER): Payer: Medicare HMO | Admitting: Psychiatry

## 2022-10-03 DIAGNOSIS — F331 Major depressive disorder, recurrent, moderate: Secondary | ICD-10-CM

## 2022-10-03 DIAGNOSIS — F901 Attention-deficit hyperactivity disorder, predominantly hyperactive type: Secondary | ICD-10-CM | POA: Diagnosis not present

## 2022-10-03 MED ORDER — DULOXETINE HCL 60 MG PO CPEP: 60.0000 mg | ORAL_CAPSULE | Freq: Every morning | ORAL | 2 refills | Status: DC

## 2022-10-03 MED ORDER — CLONAZEPAM 0.5 MG PO TABS: 0.5000 mg | ORAL_TABLET | Freq: Two times a day (BID) | ORAL | 2 refills | Status: DC | PRN

## 2022-10-03 MED ORDER — AMPHETAMINE-DEXTROAMPHETAMINE 30 MG PO TABS: 30.0000 mg | ORAL_TABLET | Freq: Two times a day (BID) | ORAL | 0 refills | Status: DC

## 2022-10-03 MED ORDER — ARIPIPRAZOLE 5 MG PO TABS: 5.0000 mg | ORAL_TABLET | Freq: Every day | ORAL | 1 refills | Status: DC

## 2022-10-03 NOTE — Progress Notes (Signed)
Virtual Visit via Telephone Note  I connected with [name] on 10/03/22 at 11:20 AM EDT by telephone and verified that I am speaking with the correct person using two identifiers.  Location: Patient: home Provider: office   I discussed the limitations, risks, security and privacy concerns of performing an evaluation and management service by telephone and the availability of in person appointments. I also discussed with the patient that there may be a patient responsible charge related to this service. The patient expressed understanding and agreed to proceed.      I discussed the assessment and treatment plan with the patient. The patient was provided an opportunity to ask questions and all were answered. The patient agreed with the plan and demonstrated an understanding of the instructions.   The patient was advised to call back or seek an in-person evaluation if the symptoms worsen or if the condition fails to improve as anticipated.  I provided 15 minutes of non-face-to-face time during this encounter.   Diannia Ruder, MD  [facility] MD/PA/NP OP Progress Note  10/03/2022 11:54 AM [name]  MRN:  [id number]  Chief Complaint:  Chief Complaint  Patient presents with   Anxiety   Depression   ADHD   Follow-up   HPI:  This patient is a 57 year old married white female who lives with her husband in [place].  She is on disability.   The patient returns for follow-up regarding her depression anxiety and ADD after 3 months.  She states that her husband's cancer in his throat has returned and he is getting a new form of immunotherapy.  He is still trying to work a little bit as an Personnel officer and she has been helping him.  She enjoys spending time with him and they are trying to do everything they can together.  Unfortunately is not able to eat any food and is had a PEG tube put in.  Nevertheless she states that her mood has been stable and she denies significant  depression anxiety trouble sleeping.  Her focus is good with the Adderall.  She has lost weight with Ozempic.  Unfortunately she continues to smoke about a pack a day and I urged her to at least try to continue to cut down.  She denies any thoughts of suicide or self-harm Visit Diagnosis:    ICD-10-CM   1. Moderate episode of recurrent major depressive disorder (HCC)  F33.1     2. Attention deficit hyperactivity disorder (ADHD), predominantly hyperactive type  F90.1       Past Psychiatric History: The patient was hospitalized twice in her 50s and again in 2006 and 2020 for drug overdose with suicidal intent  Past Medical History:  Past Medical History:  Diagnosis Date   ADHD    Alcohol abuse    Anemia    Anxiety    Asthmatic bronchitis    normal PFT/ seen by pulmonary no evidence of COPD   Atypical chest pain 10/05/2017   Back pain    Bilateral swelling of feet    Chest pain    Chest pain on respiration 03/25/2014   Chronic bronchitis (HCC)    Chronic lower back pain    Chronic respiratory failure (HCC) 10/16/2011   Newly 02 dep 24/7 p discharge from [facility] 01/2013  - 03/13/2014  Walked RA  2 laps @ 185 ft each stopped due to  Sob/ aching in legs, thirsty/ no desat @ slow pace    Chronic respiratory failure with hypoxia (HCC)  On 2-3 L of oxygen at home   Cigarette smoker 12/02/2010   Followed in Pulmonary clinic/ [facility]/ [place]   - Limits of effective care reviewed 12/22/2011     Complication of anesthesia    Constipation    COPD (chronic obstructive pulmonary disease) (HCC)    COPD with chronic bronchitis 10/01/2017   Daily headache    Depression    Diabetic peripheral neuropathy (HCC)    DM (diabetes mellitus) type II controlled, neurological manifestation (HCC) 12/02/2010   Drug use    Gallbladder problem    Gastric erosions    EGD 08/2010.   GERD (gastroesophageal reflux disease)    Glaucoma    H/O drug abuse (HCC) 11/12/2017   -- scanned document from outside  source: [facility] in [place] which showed UDS positive for Adderall /amphetamine usage as well as positive urine for THC.  This test result was collected 08/11/2016 and reported 10/07/2016. - lso review of the chart shows another positive amphetamine, THC and METH in the urine back on 10/18/2015 under "care everywhere". ---So    Heavy menses    High cholesterol    History of blood transfusion    "related to low HgB" (10/05/2017)   History of hiatal hernia    HTN (hypertension)    Hyperlipidemia associated with type 2 diabetes mellitus (HCC) 10/18/2015   Hypertension associated with diabetes (HCC) 12/02/2010   D/c acei 12/22/2011 due to psuedowheeze and narcotic dependent cough> ? Improved - 03/14/2014 started bystolic in place of cozar due to cough     IBS (irritable bowel syndrome)    Increased urinary protein excretion    Internal hemorrhoids    Colonoscopy 5/12.   Iron deficiency anemia 10/01/2017   Joint pain    Mixed diabetic hyperlipidemia associated with type 2 diabetes mellitus (HCC) 10/01/2017   On home oxygen therapy    "5L at night" (10/05/2017)   OSA on CPAP    Osteoarthritis    "back" (10/05/2017)   Oxygen dependent 10/16/2011   Pneumonia    "lots of times" (10/05/2017)   PONV (postoperative nausea and vomiting)    Poorly controlled diabetes mellitus (HCC) 11/12/2017   Pulmonary infiltrates 12/22/2011   Followed in Pulmonary clinic/ [facility]/ [place]    - See CT Chest 05/05/11    Shortness of breath    Tachycardia    never had test done since no insurance   Tobacco use disorder-current smoker greater than 40-pack-year history- since age 69 1 ppd 10/01/2017   Type II diabetes mellitus (HCC)    Vitamin D deficiency     Past Surgical History:  Procedure Laterality Date   CESAREAN SECTION  1988; 1989   CHOLECYSTECTOMY OPEN  1990   COLONOSCOPY  09/16/2010   ZHY:[id number] colon/small internal hemorrhoids   ESOPHAGOGASTRODUODENOSCOPY   09/16/2010   SLF: normal/mild gastritis   ESOPHAGOGASTRODUODENOSCOPY N/A 10/19/2014   Procedure: ESOPHAGOGASTRODUODENOSCOPY (EGD);  Surgeon: West Bali, MD;  Location: [facility] ENDO SUITE;  Service: Endoscopy;  Laterality: N/A;  830   FRACTURE SURGERY     HYSTEROSCOPY WITH THERMACHOICE  01/17/2012   Procedure: HYSTEROSCOPY WITH THERMACHOICE;  Surgeon: Lazaro Arms, MD;  Location: [facility] ORS;  Service: Gynecology;  Laterality: N/A;  total therapy time: 9:13sec  D5W  18 ml in, D5W   18ml out, temperture 87degrees celcious   KIDNEY SURGERY     as child for blockages   LEFT HEART CATH AND CORONARY ANGIOGRAPHY N/A 09/08/2019  Procedure: LEFT HEART CATH AND CORONARY ANGIOGRAPHY;  Surgeon: Swaziland, Peter M, MD;  Location: [facility] INVASIVE CV LAB;  Service: Cardiovascular;  Laterality: N/A;   TONSILLECTOMY     TUBAL LIGATION  1989   TYMPANOSTOMY TUBE PLACEMENT Bilateral    "several times when I was a child"   uterine ablation     WRIST FRACTURE SURGERY Left 1995    Family Psychiatric History: See below  Family History:  Family History  Problem Relation Age of Onset   Heart attack Mother 60       deceased   Diabetes Mother    Breast cancer Mother 43       inflammatory breast ca   Heart failure Mother        oxygen dependence, nonsmoker   Heart disease Mother    Depression Mother    Cancer Mother        Breast   Hypertension Mother    Hyperlipidemia Mother    Sudden death Mother    Sleep apnea Mother    Obesity Mother    Heart attack Father 46       deceased, etoh use   Heart disease Father    Alcohol abuse Father    Depression Father    Heart failure Father    Sudden death Father    Ulcers Sister    Hypertension Sister    Heart failure Sister    Depression Sister    Anxiety disorder Sister    Liver disease Maternal Aunt 40       died while on liver transplant list   Liver disease Maternal Uncle    Cancer Paternal Aunt        Breast   Heart disease Maternal Grandmother    Heart  attack Maternal Grandmother        premature CAD   Colon cancer Neg Hx     Social History:  Social History   Socioeconomic History   Marital status: Married    Spouse name: Not on file   Number of children: 2   Years of education: Not on file   Highest education level: Not on file  Occupational History   Occupation: unemployed  Tobacco Use   Smoking status: Every Day    Packs/day: 1.50    Years: 40.00    Additional pack years: 0.00    Total pack years: 60.00    Types: Cigarettes, E-cigarettes    Start date: 1980   Smokeless tobacco: Current   Tobacco comments:    vape  Building services engineer Use: Every day  Substance and Sexual Activity   Alcohol use: Not Currently   Drug use: Yes    Types: Marijuana    Comment: occ   Sexual activity: Not Currently    Partners: Male    Birth control/protection: Surgical    Comment: BTL, younger than 16, more than 5  Other Topics Concern   Not on file  Social History Narrative   Has 2 step children.   Lives in multi-family home with children and grandchildren.   On disability.   Social Determinants of Health   Financial Resource Strain: Low Risk  (06/29/2022)   Overall Financial Resource Strain (CARDIA)    Difficulty of Paying Living Expenses: Not hard at all  Food Insecurity: No Food Insecurity (06/14/2022)   Hunger Vital Sign    Worried About Running Out of Food in the Last Year: Never true    Ran Out of Food in  the Last Year: Never true  Transportation Needs: No Transportation Needs (06/29/2022)   PRAPARE - Administrator, Civil Service (Medical): No    Lack of Transportation (Non-Medical): No  Physical Activity: Inactive (10/30/2019)   Exercise Vital Sign    Days of Exercise per Week: 0 days    Minutes of Exercise per Session: 0 min  Stress: Not on file  Social Connections: Moderately Isolated (10/30/2019)   Social Connection and Isolation Panel [NHANES]    Frequency of Communication with Friends and Family: More  than three times a week    Frequency of Social Gatherings with Friends and Family: More than three times a week    Attends Religious Services: Never    Database administrator or Organizations: No    Attends Banker Meetings: Never    Marital Status: Married    Allergies:  Allergies  Allergen Reactions   Codeine Itching and Nausea Only   Metformin And Related Diarrhea, Nausea Only and Other (See Comments)    Stomach pain/nausea   Wellbutrin [Bupropion Hcl] Hives   Acyclovir And Related Rash    Metabolic Disorder Labs: Lab Results  Component Value Date   HGBA1C 6.6 (H) 09/19/2022   MPG 177.16 06/02/2021   MPG 188.64 03/05/2019   No results found for: "PROLACTIN" Lab Results  Component Value Date   CHOL 119 12/14/2021   TRIG 86.0 12/14/2021   HDL 44.10 12/14/2021   CHOLHDL 3 12/14/2021   VLDL 17.2 12/14/2021   LDLCALC 57 12/14/2021   LDLCALC 63 03/14/2021   Lab Results  Component Value Date   TSH 1.860 06/30/2019   TSH 2.40 11/26/2018    Therapeutic Level Labs: No results found for: "LITHIUM" No results found for: "VALPROATE" No results found for: "CBMZ"  Current Medications: Current Outpatient Medications  Medication Sig Dispense Refill   ACCU-CHEK GUIDE test strip USE UP TO FOUR TIMES DAILY AS DIRECTED 200 strip 11   Accu-Chek Softclix Lancets lancets USE UP TO FOUR TIMES DAILY AS DIRECTED 300 each 0   albuterol (PROVENTIL) (2.5 MG/3ML) 0.083% nebulizer solution Take 3 mLs (2.5 mg total) by nebulization every 6 (six) hours as needed for wheezing or shortness of breath. 75 mL 0   albuterol (VENTOLIN HFA) 108 (90 Base) MCG/ACT inhaler Inhale 2 puffs into the lungs every 6 (six) hours as needed for wheezing or shortness of breath. 8 g 6   amphetamine-dextroamphetamine (ADDERALL) 30 MG tablet Take 1 tablet by mouth 2 (two) times daily. 60 tablet 0   amphetamine-dextroamphetamine (ADDERALL) 30 MG tablet Take 1 tablet by mouth 2 (two) times daily. 60  tablet 0   amphetamine-dextroamphetamine (ADDERALL) 30 MG tablet Take 1 tablet by mouth 2 (two) times daily. 60 tablet 0   ARIPiprazole (ABILIFY) 5 MG tablet Take 1 tablet (5 mg total) by mouth daily. 90 tablet 1   atorvastatin (LIPITOR) 40 MG tablet TAKE 0.5 TABLETS (20 MG TOTAL) BY MOUTH 2 (TWO) TIMES DAILY. 90 tablet 3   bismuth subsalicylate (PEPTO BISMOL) 262 MG/15ML suspension Take 30 mLs by mouth every 6 (six) hours as needed for indigestion.     blood glucose meter kit and supplies Dispense based on patient and insurance preference. Use up to four times daily as directed. (FOR ICD-10 E10.9, E11.9). 1 each 0   clonazePAM (KLONOPIN) 0.5 MG tablet Take 1 tablet (0.5 mg total) by mouth 2 (two) times daily as needed for anxiety. 60 tablet 2   dapagliflozin propanediol (FARXIGA)  10 MG TABS tablet TAKE 1 TABLET(10 MG) BY MOUTH DAILY 90 tablet 2   Dextromethorphan-guaiFENesin (MUCINEX DM) 30-600 MG TB12 Take 1 tablet by mouth every 12 (twelve) hours as needed (for coughing or to loosen mucus).     DULoxetine (CYMBALTA) 60 MG capsule Take 1 capsule (60 mg total) by mouth in the morning. 90 capsule 2   fluticasone (FLONASE) 50 MCG/ACT nasal spray PLACE 1 SPRAY INTO BOTH NOSTRILS 2 (TWO) TIMES DAILY AS NEEDED FOR ALLERGIES OR RHINITIS (AFTER SINUS RINSE). (Patient taking differently: Place 1 spray into both nostrils in the morning and at bedtime.) 48 mL 2   glipiZIDE (GLUCOTROL) 5 MG tablet TAKE 1 TABLET(5 MG) BY MOUTH TWICE DAILY BEFORE A MEAL 60 tablet 3   ibuprofen (ADVIL) 200 MG tablet Take 600 mg by mouth every 6 (six) hours as needed for mild pain or headache.     insulin glargine (LANTUS) 100 UNIT/ML injection Inject 0.2 mLs (20 Units total) into the skin daily with breakfast. 10 mL 11   Insulin Pen Needle (BD PEN NEEDLE NANO 2ND GEN) 32G X 4 MM MISC USE DAILY WITH VICTOZA 100 each 11   Insulin Syringe-Needle U-100 (INSULIN SYRINGE .5CC/31GX5/16") 31G X 5/16" 0.5 ML MISC USE AS DIRECTED ONCE DAILY  100 each 2   ipratropium-albuterol (DUONEB) 0.5-2.5 (3) MG/3ML SOLN Take 3 mLs by nebulization 3 (three) times daily. 360 mL 3   losartan (COZAAR) 25 MG tablet TAKE 1 TABLET DAILY 90 tablet 3   meloxicam (MOBIC) 15 MG tablet Take 15 mg by mouth daily.     metoprolol tartrate (LOPRESSOR) 25 MG tablet TAKE 1 TABLET(25 MG) BY MOUTH TWICE DAILY (Patient taking differently: Take 25 mg by mouth daily.) 180 tablet 0   nystatin (MYCOSTATIN/NYSTOP) powder Apply 1 application topically 2 (two) times daily. (Patient taking differently: Apply 1 application  topically 2 (two) times daily as needed (rash).) 15 g 3   pantoprazole (PROTONIX) 40 MG tablet TAKE 1 TABLET BY MOUTH TWICE A DAY (Patient taking differently: Take 40 mg by mouth 2 (two) times daily before a meal.) 60 tablet 11   pregabalin (LYRICA) 150 MG capsule TAKE 1 CAPSULE BY MOUTH TWICE A DAY 180 capsule 5   Semaglutide, 2 MG/DOSE, (OZEMPIC, 2 MG/DOSE,) 8 MG/3ML SOPN INJECT 2 MG AS DIRECTED ONCE A WEEK. 8 mL 6   umeclidinium-vilanterol (ANORO ELLIPTA) 62.5-25 MCG/ACT AEPB Inhale 1 puff into the lungs daily. 30 each 11   No current facility-administered medications for this visit.     Musculoskeletal: Strength & Muscle Tone: na Gait & Station: na Patient leans: N/A  Psychiatric Specialty Exam: Review of Systems  All other systems reviewed and are negative.   Last menstrual period 07/24/2014.There is no height or weight on file to calculate BMI.  General Appearance: NA  Eye Contact:  NA  Speech:  Clear and Coherent  Volume:  Normal  Mood:  Euthymic  Affect:  NA  Thought Process:  Goal Directed  Orientation:  Full (Time, Place, and Person)  Thought Content: WDL   Suicidal Thoughts:  No  Homicidal Thoughts:  No  Memory:  Immediate;   Good Recent;   Good Remote;   Good  Judgement:  Good  Insight:  Good  Psychomotor Activity:  Normal  Concentration:  Concentration: Good and Attention Span: Good  Recall:  Good  Fund of Knowledge:  Good  Language: Good  Akathisia:  No  Handed:  Right  AIMS (if indicated): not done  Assets:  Communication Skills Desire for Improvement Resilience Social Support Talents/Skills  ADL's:  Intact  Cognition: WNL  Sleep:  Good   Screenings: AIMS    Flowsheet Row Admission (Discharged) [facility] 03[facility]07/2019 in [facility]  AIMS Total Score 0 0      AUDIT    Flowsheet Row Admission (Discharged) from 03/07/2019 in [facility]INE [facility] (Discharged[facility]  Alcohol Use Disorder Identification Test Final Score (AUDIT) 0 0      GAD-7    Flowsheet Row Office Visit from 03/20/2022 in [facility] [employer] Visit from 03/14/2021 in [facility] [employer] Visit from 10/20/2020 in [facility] [employer] Visit from 11/06/2019 in [facility] [employer]  Total GAD-7 Score 8 6 9 5       PHQ2-9    Flowsheet Row Office Visit from 09/19/2022 in [facility] Visit from 03/20/2022 in [facility] [employer] Video Visit from 03/03/2022 in [facility] [place] Telephone from 01/25/2022 in [facility] Office Visit from 12/14/2021 in [facility] [employer]  PHQ-2 Total Score 0 3 1 1  0  PHQ-9 Total Score 2 9 -- -- 5      Flowsheet Row ED to Hosp-Admission (Discharged) from 06/09/2022 in [facility] Video Visit from 03/03/2022 in [facility] [place] Video Visit from 12/01/2021 in [facility] at [place]  C-SSRS  RISK CATEGORY No Risk No Risk No Risk        Assessment and Plan: This patient is a 57 year old female with a history of depression anxiety and ADD.  Despite her husband's situation she has been stable on her current regimen.  She will continue Cymbalta 60 mg daily for depression, clonazepam 0.5 mg twice daily for anxiety, Abilify 5 mg daily for antidepressant augmentation and Adderall 30 mg twice daily for ADD.  She will return to see me in 3 months  Collaboration of Care: Collaboration of Care: Primary Care Provider AEB notes are shared with PCP on the epic system  Patient/Guardian was advised Release of Information must be obtained prior to any record release in order to collaborate their care with an outside provider. Patient/Guardian was advised if they have not already done so to contact the registration department to sign all necessary forms in order for [place] to release information regarding their care.   Consent: Patient/Guardian gives verbal consent for treatment and assignment of benefits for services provided during this visit. Patient/Guardian expressed understanding and agreed to proceed.    Diannia Ruder, MD 10/03/2022, 11:54 AM

## 2022-10-30 ENCOUNTER — Other Ambulatory Visit: Payer: Self-pay | Admitting: Family Medicine

## 2022-10-30 DIAGNOSIS — E1142 Type 2 diabetes mellitus with diabetic polyneuropathy: Secondary | ICD-10-CM

## 2022-11-02 ENCOUNTER — Telehealth: Payer: Self-pay

## 2022-11-02 NOTE — Telephone Encounter (Signed)
Called and left voice message to schedule a mobile mammogram and mobile eye exam.

## 2022-11-06 ENCOUNTER — Telehealth (HOSPITAL_COMMUNITY): Payer: Self-pay | Admitting: *Deleted

## 2022-11-06 NOTE — Telephone Encounter (Signed)
Called patient to schedule a f/u with provider before September 11th. Staff was not able to reach patient and lmom.

## 2022-11-20 ENCOUNTER — Other Ambulatory Visit: Payer: Self-pay | Admitting: Family Medicine

## 2022-11-20 DIAGNOSIS — E1165 Type 2 diabetes mellitus with hyperglycemia: Secondary | ICD-10-CM

## 2022-11-21 ENCOUNTER — Other Ambulatory Visit: Payer: Self-pay | Admitting: Family Medicine

## 2022-11-22 ENCOUNTER — Encounter (INDEPENDENT_AMBULATORY_CARE_PROVIDER_SITE_OTHER): Payer: Self-pay

## 2022-11-26 ENCOUNTER — Other Ambulatory Visit: Payer: Self-pay | Admitting: Family Medicine

## 2022-11-26 DIAGNOSIS — E1142 Type 2 diabetes mellitus with diabetic polyneuropathy: Secondary | ICD-10-CM

## 2022-12-11 ENCOUNTER — Other Ambulatory Visit: Payer: Self-pay | Admitting: Family Medicine

## 2022-12-11 DIAGNOSIS — E1142 Type 2 diabetes mellitus with diabetic polyneuropathy: Secondary | ICD-10-CM

## 2022-12-20 ENCOUNTER — Ambulatory Visit (INDEPENDENT_AMBULATORY_CARE_PROVIDER_SITE_OTHER): Payer: Medicare HMO | Admitting: Family Medicine

## 2022-12-20 ENCOUNTER — Encounter: Payer: Self-pay | Admitting: Family Medicine

## 2022-12-20 VITALS — BP 128/70 | HR 74 | Temp 97.0°F | Ht 64.0 in | Wt 272.0 lb

## 2022-12-20 DIAGNOSIS — Z794 Long term (current) use of insulin: Secondary | ICD-10-CM | POA: Diagnosis not present

## 2022-12-20 DIAGNOSIS — Z23 Encounter for immunization: Secondary | ICD-10-CM | POA: Diagnosis not present

## 2022-12-20 DIAGNOSIS — J4489 Other specified chronic obstructive pulmonary disease: Secondary | ICD-10-CM

## 2022-12-20 DIAGNOSIS — Z7985 Long-term (current) use of injectable non-insulin antidiabetic drugs: Secondary | ICD-10-CM | POA: Diagnosis not present

## 2022-12-20 DIAGNOSIS — E782 Mixed hyperlipidemia: Secondary | ICD-10-CM | POA: Diagnosis not present

## 2022-12-20 DIAGNOSIS — E1142 Type 2 diabetes mellitus with diabetic polyneuropathy: Secondary | ICD-10-CM

## 2022-12-20 DIAGNOSIS — Z7984 Long term (current) use of oral hypoglycemic drugs: Secondary | ICD-10-CM | POA: Diagnosis not present

## 2022-12-20 DIAGNOSIS — H7291 Unspecified perforation of tympanic membrane, right ear: Secondary | ICD-10-CM

## 2022-12-20 DIAGNOSIS — I10 Essential (primary) hypertension: Secondary | ICD-10-CM | POA: Diagnosis not present

## 2022-12-20 DIAGNOSIS — Z1231 Encounter for screening mammogram for malignant neoplasm of breast: Secondary | ICD-10-CM | POA: Diagnosis not present

## 2022-12-20 LAB — HEMOGLOBIN A1C: Hgb A1c MFr Bld: 6.7 % — ABNORMAL HIGH (ref 4.6–6.5)

## 2022-12-20 LAB — MICROALBUMIN / CREATININE URINE RATIO
Creatinine,U: 45.4 mg/dL
Microalb Creat Ratio: 1.5 mg/g (ref 0.0–30.0)
Microalb, Ur: <0.7 mg/dL (ref 0.0–1.9)

## 2022-12-20 LAB — GLUCOSE, RANDOM: Glucose, Bld: 101 mg/dL — ABNORMAL HIGH (ref 70–99)

## 2022-12-20 NOTE — Assessment & Plan Note (Signed)
I will check her A1c today. Continue glipizide 5 mg bid, insulin glargine 20 units daily and semaglutide (Ozempic) 2 mg weekly. If her A1c is back above goal, I would recommend she restart her dapagliflozin Marcelline Deist) 10 mg daily

## 2022-12-20 NOTE — Assessment & Plan Note (Signed)
I don't see any sign of infection at present. I recommend she follow-up with her ENT physician regarding planned surgery to patch the perforation.

## 2022-12-20 NOTE — Assessment & Plan Note (Signed)
Lipids are at goal. Continue atorvastatin 40 mg daily.

## 2022-12-20 NOTE — Assessment & Plan Note (Signed)
Blood pressure is at goal. Continue metoprolol 25 mg bid and losartan 25 mg daily.

## 2022-12-20 NOTE — Progress Notes (Signed)
[facility] [address] [place] [address] Dept: [phone] Dept Fax: [phone]  Chronic Care Office Visit  Subjective:    Patient ID: [name], female    DOB: 08/25/1965, 57 y.o..   MRN: [id number]  Chief Complaint  Patient presents with   Follow-up    3 month f/u.  C/o RT ear drainage.  No fasting today.    History of Present Illness:  Patient is in today for reassessment of chronic medical issues.  Ms. Roys has a history of Type 2 diabetes. She is managed on glipizide 5 mg bid, insulin glargine 20 units daily and semaglutide (Ozempic) 2 mg weekly. Her diabetes is complicated by peripheral neuropathy. This is managed with Lyrica 150 mg bid. She had been on dapagliflozin Marcelline Deist) 10 mg daily, but notes she ran out and did not get this refilled. She is hoping her diabetes would still meet goals without as much medicine.   Ms. Elmendorf has a history of hypertension. She is managed on metoprolol 25 mg bid and losartan 25 mg daily.   Ms. Ellstrom has a history of COPD with chronic bronchitis. She is managed on albuterol (inhaler and nebulizer) and Duoneb.   Ms. Kampman has a history of a perforated right eardrum. She notes she has been having some recent drainage from that ear.  Past Medical History: Patient Active Problem List   Diagnosis Date Noted   AKI (acute kidney injury) (HCC) 06/09/2022   Coronary atherosclerosis 05/23/2022   Aortic atherosclerosis (HCC) 05/23/2022   Arthritis of left knee 12/14/2021   Acute respiratory failure with hypoxia (HCC) 06/02/2021   Marijuana use 03/14/2021   Allergic rhinitis 03/14/2021   Angina pectoris (HCC) 09/08/2019   Benzodiazepine overdose 03/05/2019   Generalized muscle ache 09/24/2018   Chronic right ear pain 03/11/2018   Chronic sinusitis 03/11/2018   H/O drug abuse (HCC) 11/12/2017   Positive for macroalbuminuria 11/12/2017   Atypical chest pain  10/05/2017   COPD with chronic bronchitis 10/01/2017   Mixed hyperlipidemia 10/01/2017   Tobacco use disorder 10/01/2017   Diabetic peripheral neuropathy (HCC) 10/01/2017   Iron deficiency anemia 10/01/2017   Adult attention deficit hyperactivity disorder 01/14/2016   Major depressive disorder, recurrent severe without psychotic features (HCC)    Leukocytosis 03/25/2014   OSA on CPAP 04/11/2013   Perforated ear drum, right 05/22/2012   Hypokalemia 05/08/2012   Bell's palsy 01/23/2012   Carpal tunnel syndrome on both sides 01/11/2012   Oxygen dependent 10/16/2011   Vitamin D deficiency 07/06/2011   Asthmatic bronchitis 06/11/2011   Back pain 06/11/2011   Morbid obesity with BMI of 45.0-49.9, adult (HCC) 05/06/2011   Gastric erosions 12/05/2010   Essential hypertension 12/02/2010   Type 2 diabetes mellitus with polyneuropathy (HCC) 12/02/2010   COPD with acute exacerbation (HCC) 11/30/2010   GERD (gastroesophageal reflux disease) 09/08/2010   Esophageal dysphagia 09/08/2010   Past Surgical History:  Procedure Laterality Date   CESAREAN SECTION  1988; 1989   CHOLECYSTECTOMY OPEN  1990   COLONOSCOPY  09/16/2010   ONG:EXBMWU colon/small internal hemorrhoids   ESOPHAGOGASTRODUODENOSCOPY  09/16/2010   SLF: normal/mild gastritis   ESOPHAGOGASTRODUODENOSCOPY N/A 10/19/2014   Procedure: ESOPHAGOGASTRODUODENOSCOPY (EGD);  Surgeon: West Bali, MD;  Location: [facility];  Service: Endoscopy;  Laterality: N/A;  830   FRACTURE SURGERY     HYSTEROSCOPY WITH THERMACHOICE  01/17/2012   Procedure: HYSTEROSCOPY WITH THERMACHOICE;  Surgeon: Lazaro Arms, MD;  Location: [facility];  Service: Gynecology;  Laterality: N/A;  total therapy time: 9:13sec  D5W  18 ml in, D5W   18ml out, temperture 87degrees celcious   KIDNEY SURGERY     as child for blockages   LEFT HEART CATH AND CORONARY ANGIOGRAPHY N/A 09/08/2019   Procedure: LEFT HEART CATH AND CORONARY ANGIOGRAPHY;  Surgeon: [place]az[place], Peter M, MD;   Location: [facility] INVASIVE CV LAB;  Service: Cardiovascular;  Laterality: N/A;   TONSILLECTOMY     TUBAL LIGATION  1989   TYMPANOSTOMY TUBE PLACEMENT Bilateral    "several times when I was a child"   uterine ablation     WRIST FRACTURE SURGERY Left 1995   Family History  Problem Relation Age of Onset   Heart attack Mother 54       deceased   Diabetes Mother    Breast cancer Mother 16       inflammatory breast ca   Heart failure Mother        oxygen dependence, nonsmoker   Heart disease Mother    Depression Mother    Cancer Mother        Breast   Hypertension Mother    Hyperlipidemia Mother    Sudden death Mother    Sleep apnea Mother    Obesity Mother    Heart attack Father 53       deceased, etoh use   Heart disease Father    Alcohol abuse Father    Depression Father    Heart failure Father    Sudden death Father    Ulcers Sister    Hypertension Sister    Heart failure Sister    Depression Sister    Anxiety disorder Sister    Liver disease Maternal Aunt 40       died while on liver transplant list   Liver disease Maternal Uncle    Cancer Paternal Aunt        Breast   Heart disease Maternal Grandmother    Heart attack Maternal Grandmother        premature CAD   Colon cancer Neg Hx    Outpatient Medications Prior to Visit  Medication Sig Dispense Refill   ACCU-CHEK GUIDE test strip USE UP TO FOUR TIMES DAILY AS DIRECTED 200 strip 11   Accu-Chek Softclix Lancets lancets USE UP TO FOUR TIMES DAILY AS DIRECTED 300 each 0   albuterol (PROVENTIL) (2.5 MG/3ML) 0.083% nebulizer solution Take 3 mLs (2.5 mg total) by nebulization every 6 (six) hours as needed for wheezing or shortness of breath. 75 mL 0   albuterol (VENTOLIN HFA) 108 (90 Base) MCG/ACT inhaler Inhale 2 puffs into the lungs every 6 (six) hours as needed for wheezing or shortness of breath. 8 g 6   amphetamine-dextroamphetamine (ADDERALL) 30 MG tablet Take 1 tablet by mouth 2 (two) times daily. 60 tablet 0    amphetamine-dextroamphetamine (ADDERALL) 30 MG tablet Take 1 tablet by mouth 2 (two) times daily. 60 tablet 0   amphetamine-dextroamphetamine (ADDERALL) 30 MG tablet Take 1 tablet by mouth 2 (two) times daily. 60 tablet 0   ARIPiprazole (ABILIFY) 5 MG tablet Take 1 tablet (5 mg total) by mouth daily. 90 tablet 1   atorvastatin (LIPITOR) 40 MG tablet TAKE 0.5 TABLETS (20 MG TOTAL) BY MOUTH 2 (TWO) TIMES DAILY. 90 tablet 3   bismuth subsalicylate (PEPTO BISMOL) 262 MG/15ML suspension Take 30 mLs by mouth every 6 (six) hours as needed for indigestion.     blood glucose meter kit and  supplies Dispense based on patient and insurance preference. Use up to four times daily as directed. (FOR ICD-10 E10.9, E11.9). 1 each 0   clonazePAM (KLONOPIN) 0.5 MG tablet Take 1 tablet (0.5 mg total) by mouth 2 (two) times daily as needed for anxiety. 60 tablet 2   Dextromethorphan-guaiFENesin (MUCINEX DM) 30-600 MG TB12 Take 1 tablet by mouth every 12 (twelve) hours as needed (for coughing or to loosen mucus).     DULoxetine (CYMBALTA) 60 MG capsule Take 1 capsule (60 mg total) by mouth in the morning. 90 capsule 2   fluticasone (FLONASE) 50 MCG/ACT nasal spray PLACE 1 SPRAY INTO BOTH NOSTRILS 2 (TWO) TIMES DAILY AS NEEDED FOR ALLERGIES OR RHINITIS (AFTER SINUS RINSE). (Patient taking differently: Place 1 spray into both nostrils in the morning and at bedtime.) 48 mL 2   glipiZIDE (GLUCOTROL) 5 MG tablet TAKE 1 TABLET(5 MG) BY MOUTH TWICE DAILY BEFORE A MEAL 60 tablet 3   ibuprofen (ADVIL) 200 MG tablet Take 600 mg by mouth every 6 (six) hours as needed for mild pain or headache.     insulin glargine (LANTUS) 100 UNIT/ML injection Inject 0.2 mLs (20 Units total) into the skin at bedtime. 10 mL 6   Insulin Pen Needle (BD PEN NEEDLE NANO 2ND GEN) 32G X 4 MM MISC USE DAILY WITH VICTOZA 100 each 11   Insulin Syringe-Needle U-100 (INSULIN SYRINGE .5CC/31GX5/16") 31G X 5/16" 0.5 ML MISC USE AS DIRECTED ONCE DAILY 100 each 2    ipratropium-albuterol (DUONEB) 0.5-2.5 (3) MG/3ML SOLN Take 3 mLs by nebulization 3 (three) times daily. 360 mL 3   losartan (COZAAR) 25 MG tablet TAKE 1 TABLET DAILY 90 tablet 3   meloxicam (MOBIC) 15 MG tablet Take 15 mg by mouth daily.     nystatin (MYCOSTATIN/NYSTOP) powder Apply 1 application topically 2 (two) times daily. (Patient taking differently: Apply 1 application  topically 2 (two) times daily as needed (rash).) 15 g 3   pantoprazole (PROTONIX) 40 MG tablet TAKE 1 TABLET BY MOUTH TWICE A DAY 180 tablet 1   pregabalin (LYRICA) 150 MG capsule TAKE 1 CAPSULE BY MOUTH TWICE A DAY 180 capsule 3   Semaglutide, 2 MG/DOSE, (OZEMPIC, 2 MG/DOSE,) 8 MG/3ML SOPN INJECT 2 MG AS DIRECTED ONCE A WEEK. 8 mL 6   umeclidinium-vilanterol (ANORO ELLIPTA) 62.5-25 MCG/ACT AEPB Inhale 1 puff into the lungs daily. 30 each 11   dapagliflozin propanediol (FARXIGA) 10 MG TABS tablet TAKE 1 TABLET(10 MG) BY MOUTH DAILY (Patient not taking: Reported on 12/20/2022) 90 tablet 2   metoprolol tartrate (LOPRESSOR) 25 MG tablet TAKE 1 TABLET(25 MG) BY MOUTH TWICE DAILY (Patient not taking: Reported on 12/20/2022) 180 tablet 0   No facility-administered medications prior to visit.   Allergies  Allergen Reactions   Codeine Itching and Nausea Only   Metformin And Related Diarrhea, Nausea Only and Other (See Comments)    Stomach pain/nausea   Wellbutrin [Bupropion Hcl] Hives   Acyclovir And Related Rash   Objective:   Today's Vitals   12/20/22 1042  Temp: (!) 97 F (36.1 C)  TempSrc: Temporal  Weight: 272 lb (123.4 kg)  Height: 5\' 4"  (1.626 m)   Body mass index is 46.69 kg/m.   General: Well developed, well nourished. No acute distress. HEENT: Normocephalic, non-traumatic. External ears normal. The right EAC has some mild clear drainage. There is   a defect in the right TM in the lower/posterior portion of the drum. No redness or purulence noted. Psych:  Alert and oriented. Normal mood and affect.  Health  Maintenance Due  Topic Date Due   Zoster Vaccines- Shingrix (1 of 2) Never done   Medicare Annual Wellness (AWV)  10/29/2020   OPHTHALMOLOGY EXAM  05/25/2021   MAMMOGRAM  11/23/2022   Diabetic kidney evaluation - Urine ACR  12/15/2022   INFLUENZA VACCINE  11/23/2022     Assessment & Plan:   Problem List Items Addressed This Visit       Cardiovascular and Mediastinum   Essential hypertension    Blood pressure is at goal. Continue metoprolol 25 mg bid and losartan 25 mg daily.        Respiratory   COPD with chronic bronchitis    Stable. Continue Duonebs and albuterol.        Endocrine   Type 2 diabetes mellitus with polyneuropathy (HCC) - Primary    I will check her A1c today. Continue glipizide 5 mg bid, insulin glargine 20 units daily and semaglutide (Ozempic) 2 mg weekly. If her A1c is back above goal, I would recommend she restart her dapagliflozin (Farxiga) 10 mg daily      Relevant Orders   Glucose, random   Hemoglobin A1c   Microalbumin / creatinine urine ratio     Nervous and Auditory   Perforated ear drum, right    I don't see any sign of infection at present. I recommend she follow-up with her ENT physician regarding planned surgery to patch the perforation.        Other   Mixed hyperlipidemia    Lipids are at goal. Continue atorvastatin 40 mg daily.      Other Visit Diagnoses     Need for immunization against influenza       Relevant Orders   Flu Vaccine QUAD 6+ mos PF IM (Fluarix Quad PF) (Completed)   Encounter for screening mammogram for malignant neoplasm of breast       Relevant Orders   MM DIGITAL SCREENING BILATERAL       Return in about 3 months (around 03/22/2023) for Reassessment.   Loyola Mast, MD

## 2022-12-20 NOTE — Assessment & Plan Note (Signed)
Stable. Continue Duonebs and albuterol.

## 2023-01-05 NOTE — Addendum Note (Signed)
Addended by: Waymond Cera on: 01/05/2023 12:52 PM   Modules accepted: Orders

## 2023-01-08 ENCOUNTER — Inpatient Hospital Stay: Admission: RE | Admit: 2023-01-08 | Payer: Medicare HMO | Source: Ambulatory Visit

## 2023-01-26 ENCOUNTER — Other Ambulatory Visit (HOSPITAL_COMMUNITY): Payer: Self-pay | Admitting: Psychiatry

## 2023-01-26 ENCOUNTER — Telehealth (INDEPENDENT_AMBULATORY_CARE_PROVIDER_SITE_OTHER): Payer: Self-pay | Admitting: Psychiatry

## 2023-01-26 ENCOUNTER — Telehealth (HOSPITAL_COMMUNITY): Payer: Self-pay

## 2023-01-26 DIAGNOSIS — Z91199 Patient's noncompliance with other medical treatment and regimen due to unspecified reason: Secondary | ICD-10-CM

## 2023-01-26 MED ORDER — AMPHETAMINE-DEXTROAMPHETAMINE 30 MG PO TABS: 30.0000 mg | ORAL_TABLET | Freq: Two times a day (BID) | ORAL | 0 refills | Status: DC

## 2023-01-26 NOTE — Telephone Encounter (Signed)
Pt aware.

## 2023-01-26 NOTE — Telephone Encounter (Signed)
sent 

## 2023-01-26 NOTE — Telephone Encounter (Signed)
Pt is needing a refill on her  amphetamine-dextroamphetamine (ADDERALL) 30 MG tablet to to [facility] on [address]. Pt is scheduled for 02/01/23. Please advise.

## 2023-01-29 NOTE — Progress Notes (Signed)
No show

## 2023-02-01 ENCOUNTER — Telehealth (HOSPITAL_COMMUNITY): Payer: Medicare HMO | Admitting: Psychiatry

## 2023-02-01 ENCOUNTER — Encounter (HOSPITAL_COMMUNITY): Payer: Self-pay | Admitting: Psychiatry

## 2023-02-01 DIAGNOSIS — F331 Major depressive disorder, recurrent, moderate: Secondary | ICD-10-CM

## 2023-02-01 DIAGNOSIS — F901 Attention-deficit hyperactivity disorder, predominantly hyperactive type: Secondary | ICD-10-CM

## 2023-02-01 MED ORDER — AMPHETAMINE-DEXTROAMPHETAMINE 30 MG PO TABS: 30.0000 mg | ORAL_TABLET | Freq: Two times a day (BID) | ORAL | 0 refills | Status: DC

## 2023-02-01 MED ORDER — ARIPIPRAZOLE 5 MG PO TABS: 5.0000 mg | ORAL_TABLET | Freq: Every day | ORAL | 1 refills | Status: DC

## 2023-02-01 MED ORDER — CLONAZEPAM 0.5 MG PO TABS: 0.5000 mg | ORAL_TABLET | Freq: Two times a day (BID) | ORAL | 2 refills | Status: DC | PRN

## 2023-02-01 MED ORDER — DULOXETINE HCL 60 MG PO CPEP: 60.0000 mg | ORAL_CAPSULE | Freq: Every morning | ORAL | 2 refills | Status: DC

## 2023-02-01 NOTE — Progress Notes (Deleted)
Virtual Visit via Telephone Note  I connected with [name] on 02/01/23 at 11:00 AM EDT by telephone and verified that I am speaking with the correct person using two identifiers.  Location: Patient: home Provider: office   I discussed the limitations, risks, security and privacy concerns of performing an evaluation and management service by telephone and the availability of in person appointments. I also discussed with the patient that there may be a patient responsible charge related to this service. The patient expressed understanding and agreed to proceed.       I discussed the assessment and treatment plan with the patient. The patient was provided an opportunity to ask questions and all were answered. The patient agreed with the plan and demonstrated an understanding of the instructions.   The patient was advised to call back or seek an in-person evaluation if the symptoms worsen or if the condition fails to improve as anticipated.  I provided 15 minutes of non-face-to-face time during this encounter.   Diannia Ruder, MD  [facility] MD/PA/NP OP Progress Note  02/01/2023 11:25 AM [name]  MRN:  [id number]  Chief Complaint:  Chief Complaint  Patient presents with   Depression   Anxiety   HPI: This patient is a 57 year old married white female who lives with her husband in [place].  She is on disability.  The patient returns after 3 months regarding her depression anxiety and ADD.  She states that her husband now is cancer free from his throat cancer but it has already done a lot of damage to his esophagus and larynx.  He is getting immunotherapy and may be able to have surgery to prepare the esophagus so he can eat again.  Right now he has a feeding tube.  However she is gratified that he is cancer free for the moment.  She denies significant depression anxiety or trouble sleeping.  Her focus is good with the Adderall.  She denies any thoughts of  suicide or self-harm Visit Diagnosis:    ICD-10-CM   1. Moderate episode of recurrent major depressive disorder (HCC)  F33.1     2. Attention deficit hyperactivity disorder (ADHD), predominantly hyperactive type  F90.1       Past Psychiatric History:  The patient was hospitalized twice in her 78s and again in 2006 and 2020 for drug overdose with suicidal intent   Past Medical History:  Past Medical History:  Diagnosis Date   ADHD    Alcohol abuse    Anemia    Anxiety    Asthmatic bronchitis    normal PFT/ seen by pulmonary no evidence of COPD   Atypical chest pain 10/05/2017   Back pain    Bilateral swelling of feet    Chest pain    Chest pain on respiration 03/25/2014   Chronic bronchitis (HCC)    Chronic lower back pain    Chronic respiratory failure (HCC) 10/16/2011   Newly 02 dep 24/7 p discharge from [facility] 01/2013  - 03/13/2014  Walked RA  2 laps @ 185 ft each stopped due to  Sob/ aching in legs, thirsty/ no desat @ slow pace    Chronic respiratory failure with hypoxia (HCC)    On 2-3 L of oxygen at home   Cigarette smoker 12/02/2010   Followed in Pulmonary clinic/ [facility]/ [place]   - Limits of effective care reviewed 12/22/2011     Complication of anesthesia    Constipation    COPD (chronic obstructive pulmonary  disease) (HCC)    COPD with chronic bronchitis (HCC) 10/01/2017   Daily headache    Depression    Diabetic peripheral neuropathy (HCC)    DM (diabetes mellitus) type II controlled, neurological manifestation (HCC) 12/02/2010   Drug use    Gallbladder problem    Gastric erosions    EGD 08/2010.   GERD (gastroesophageal reflux disease)    Glaucoma    H/O drug abuse (HCC) 11/12/2017   -- scanned document from outside source: [facility] in [place] which showed UDS positive for Adderall /amphetamine usage as well as positive urine for THC.  This test result was collected 08/11/2016 and reported 10/07/2016. - lso review of  the chart shows another positive amphetamine, THC and METH in the urine back on 10/18/2015 under "care everywhere". ---So    Heavy menses    High cholesterol    History of blood transfusion    "related to low HgB" (10/05/2017)   History of hiatal hernia    HTN (hypertension)    Hyperlipidemia associated with type 2 diabetes mellitus (HCC) 10/18/2015   Hypertension associated with diabetes (HCC) 12/02/2010   D/c acei 12/22/2011 due to psuedowheeze and narcotic dependent cough> ? Improved - 03/14/2014 started bystolic in place of cozar due to cough     IBS (irritable bowel syndrome)    Increased urinary protein excretion    Internal hemorrhoids    Colonoscopy 5/12.   Iron deficiency anemia 10/01/2017   Joint pain    Mixed diabetic hyperlipidemia associated with type 2 diabetes mellitus (HCC) 10/01/2017   On home oxygen therapy    "5L at night" (10/05/2017)   OSA on CPAP    Osteoarthritis    "back" (10/05/2017)   Oxygen dependent 10/16/2011   Pneumonia    "lots of times" (10/05/2017)   PONV (postoperative nausea and vomiting)    Poorly controlled diabetes mellitus (HCC) 11/12/2017   Pulmonary infiltrates 12/22/2011   Followed in Pulmonary clinic/ [facility]/ [place]    - See CT Chest 05/05/11    Shortness of breath    Tachycardia    never had test done since no insurance   Tobacco use disorder-current smoker greater than 40-pack-year history- since age 12 1 ppd 10/01/2017   Type II diabetes mellitus (HCC)    Vitamin D deficiency     Past Surgical History:  Procedure Laterality Date   CESAREAN SECTION  1988; 1989   CHOLECYSTECTOMY OPEN  1990   COLONOSCOPY  09/16/2010   NWG:NFAOZH colon/small internal hemorrhoids   ESOPHAGOGASTRODUODENOSCOPY  09/16/2010   SLF: normal/mild gastritis   ESOPHAGOGASTRODUODENOSCOPY N/A 10/19/2014   Procedure: ESOPHAGOGASTRODUODENOSCOPY (EGD);  Surgeon: West Bali, MD;  Location: [facility] ENDO SUITE;  Service: Endoscopy;  Laterality: N/A;  830   FRACTURE SURGERY      HYSTEROSCOPY WITH THERMACHOICE  01/17/2012   Procedure: HYSTEROSCOPY WITH THERMACHOICE;  Surgeon: Lazaro Arms, MD;  Location: [facility] ORS;  Service: Gynecology;  Laterality: N/A;  total therapy time: 9:13sec  D5W  18 ml in, D5W   18ml out, temperture 87degrees celcious   KIDNEY SURGERY     as child for blockages   LEFT HEART CATH AND CORONARY ANGIOGRAPHY N/A 09/08/2019   Procedure: LEFT HEART CATH AND CORONARY ANGIOGRAPHY;  Surgeon: [place]azil[place], Peter M, MD;  Location: [facility] INVASIVE CV LAB;  Service: Cardiovascular;  Laterality: N/A;   TONSILLECTOMY     TUBAL LIGATION  1989   TYMPANOSTOMY TUBE PLACEMENT Bilateral    "  several times when I was a child"   uterine ablation     WRIST FRACTURE SURGERY Left 1995    Family Psychiatric History: See below  Family History:  Family History  Problem Relation Age of Onset   Heart attack Mother 29       deceased   Diabetes Mother    Breast cancer Mother 40       inflammatory breast ca   Heart failure Mother        oxygen dependence, nonsmoker   Heart disease Mother    Depression Mother    Cancer Mother        Breast   Hypertension Mother    Hyperlipidemia Mother    Sudden death Mother    Sleep apnea Mother    Obesity Mother    Heart attack Father 46       deceased, etoh use   Heart disease Father    Alcohol abuse Father    Depression Father    Heart failure Father    Sudden death Father    Ulcers Sister    Hypertension Sister    Heart failure Sister    Depression Sister    Anxiety disorder Sister    Liver disease Maternal Aunt 40       died while on liver transplant list   Liver disease Maternal Uncle    Cancer Paternal Aunt        Breast   Heart disease Maternal Grandmother    Heart attack Maternal Grandmother        premature CAD   Colon cancer Neg Hx     Social History:  Social History   Socioeconomic History   Marital status: Married    Spouse name: Not on file   Number of children: 2   Years of education: Not on file    Highest education level: Not on file  Occupational History   Occupation: unemployed  Tobacco Use   Smoking status: Every Day    Current packs/day: 1.50    Average packs/day: 1.5 packs/day for 44.8 years (67.2 ttl pk-yrs)    Types: Cigarettes, E-cigarettes    Start date: 1980   Smokeless tobacco: Current   Tobacco comments:    vape  Vaping Use   Vaping status: Every Day  Substance and Sexual Activity   Alcohol use: Not Currently   Drug use: Yes    Types: Marijuana    Comment: occ   Sexual activity: Not Currently    Partners: Male    Birth control/protection: Surgical    Comment: BTL, younger than 16, more than 5  Other Topics Concern   Not on file  Social History Narrative   Has 2 step children.   Lives in multi-family home with children and grandchildren.   On disability.   Social Determinants of Health   Financial Resource Strain: Low Risk  (06/29/2022)   Overall Financial Resource Strain (CARDIA)    Difficulty of Paying Living Expenses: Not hard at all  Food Insecurity: No Food Insecurity (06/14/2022)   Hunger Vital Sign    Worried About Running Out of Food in the Last Year: Never true    Ran Out of Food in the Last Year: Never true  Transportation Needs: No Transportation Needs (06/29/2022)   PRAPARE - Administrator, Civil Service (Medical): No    Lack of Transportation (Non-Medical): No  Physical Activity: Inactive (10/30/2019)   Exercise Vital Sign    Days of Exercise per  Week: 0 days    Minutes of Exercise per Session: 0 min  Stress: Not on file  Social Connections: Moderately Isolated (10/30/2019)   Social Connection and Isolation Panel [NHANES]    Frequency of Communication with Friends and Family: More than three times a week    Frequency of Social Gatherings with Friends and Family: More than three times a week    Attends Religious Services: Never    Database administrator or Organizations: No    Attends Banker Meetings: Never     Marital Status: Married    Allergies:  Allergies  Allergen Reactions   Codeine Itching and Nausea Only   Metformin And Related Diarrhea, Nausea Only and Other (See Comments)    Stomach pain/nausea   Wellbutrin [Bupropion Hcl] Hives   Acyclovir And Related Rash    Metabolic Disorder Labs: Lab Results  Component Value Date   HGBA1C 6.7 (H) 12/20/2022   MPG 177.16 06/02/2021   MPG 188.64 03/05/2019   No results found for: "PROLACTIN" Lab Results  Component Value Date   CHOL 119 12/14/2021   TRIG 86.0 12/14/2021   HDL 44.10 12/14/2021   CHOLHDL 3 12/14/2021   VLDL 17.2 12/14/2021   LDLCALC 57 12/14/2021   LDLCALC 63 03/14/2021   Lab Results  Component Value Date   TSH 1.860 06/30/2019   TSH 2.40 11/26/2018    Therapeutic Level Labs: No results found for: "LITHIUM" No results found for: "VALPROATE" No results found for: "CBMZ"  Current Medications: Current Outpatient Medications  Medication Sig Dispense Refill   ACCU-CHEK GUIDE test strip USE UP TO FOUR TIMES DAILY AS DIRECTED 200 strip 11   Accu-Chek Softclix Lancets lancets USE UP TO FOUR TIMES DAILY AS DIRECTED 300 each 0   albuterol (PROVENTIL) (2.5 MG/3ML) 0.083% nebulizer solution Take 3 mLs (2.5 mg total) by nebulization every 6 (six) hours as needed for wheezing or shortness of breath. 75 mL 0   albuterol (VENTOLIN HFA) 108 (90 Base) MCG/ACT inhaler Inhale 2 puffs into the lungs every 6 (six) hours as needed for wheezing or shortness of breath. 8 g 6   amphetamine-dextroamphetamine (ADDERALL) 30 MG tablet Take 1 tablet by mouth 2 (two) times daily. 60 tablet 0   amphetamine-dextroamphetamine (ADDERALL) 30 MG tablet Take 1 tablet by mouth 2 (two) times daily. 60 tablet 0   amphetamine-dextroamphetamine (ADDERALL) 30 MG tablet Take 1 tablet by mouth 2 (two) times daily. 60 tablet 0   ARIPiprazole (ABILIFY) 5 MG tablet Take 1 tablet (5 mg total) by mouth daily. 90 tablet 1   atorvastatin (LIPITOR) 40 MG tablet  TAKE 0.5 TABLETS (20 MG TOTAL) BY MOUTH 2 (TWO) TIMES DAILY. 90 tablet 3   bismuth subsalicylate (PEPTO BISMOL) 262 MG/15ML suspension Take 30 mLs by mouth every 6 (six) hours as needed for indigestion.     blood glucose meter kit and supplies Dispense based on patient and insurance preference. Use up to four times daily as directed. (FOR ICD-10 E10.9, E11.9). 1 each 0   clonazePAM (KLONOPIN) 0.5 MG tablet Take 1 tablet (0.5 mg total) by mouth 2 (two) times daily as needed for anxiety. 60 tablet 2   Dextromethorphan-guaiFENesin (MUCINEX DM) 30-600 MG TB12 Take 1 tablet by mouth every 12 (twelve) hours as needed (for coughing or to loosen mucus).     DULoxetine (CYMBALTA) 60 MG capsule Take 1 capsule (60 mg total) by mouth in the morning. 90 capsule 2   fluticasone (FLONASE) 50 MCG/ACT nasal  spray PLACE 1 SPRAY INTO BOTH NOSTRILS 2 (TWO) TIMES DAILY AS NEEDED FOR ALLERGIES OR RHINITIS (AFTER SINUS RINSE). (Patient taking differently: Place 1 spray into both nostrils in the morning and at bedtime.) 48 mL 2   glipiZIDE (GLUCOTROL) 5 MG tablet TAKE 1 TABLET(5 MG) BY MOUTH TWICE DAILY BEFORE A MEAL 60 tablet 3   ibuprofen (ADVIL) 200 MG tablet Take 600 mg by mouth every 6 (six) hours as needed for mild pain or headache.     insulin glargine (LANTUS) 100 UNIT/ML injection Inject 0.2 mLs (20 Units total) into the skin at bedtime. 10 mL 6   Insulin Pen Needle (BD PEN NEEDLE NANO 2ND GEN) 32G X 4 MM MISC USE DAILY WITH VICTOZA 100 each 11   Insulin Syringe-Needle U-100 (INSULIN SYRINGE .5CC/31GX5/16") 31G X 5/16" 0.5 ML MISC USE AS DIRECTED ONCE DAILY 100 each 2   ipratropium-albuterol (DUONEB) 0.5-2.5 (3) MG/3ML SOLN Take 3 mLs by nebulization 3 (three) times daily. 360 mL 3   losartan (COZAAR) 25 MG tablet TAKE 1 TABLET DAILY 90 tablet 3   meloxicam (MOBIC) 15 MG tablet Take 15 mg by mouth daily.     pantoprazole (PROTONIX) 40 MG tablet TAKE 1 TABLET BY MOUTH TWICE A DAY 180 tablet 1   pregabalin (LYRICA)  150 MG capsule TAKE 1 CAPSULE BY MOUTH TWICE A DAY 180 capsule 3   Semaglutide, 2 MG/DOSE, (OZEMPIC, 2 MG/DOSE,) 8 MG/3ML SOPN INJECT 2 MG AS DIRECTED ONCE A WEEK. 8 mL 6   umeclidinium-vilanterol (ANORO ELLIPTA) 62.5-25 MCG/ACT AEPB Inhale 1 puff into the lungs daily. 30 each 11   No current facility-administered medications for this visit.     Musculoskeletal: Strength & Muscle Tone: {desc; muscle tone:32375} Gait & Station: {PE GAIT ED ZOXW:96045} Patient leans: {Patient Leans:21022755}  Psychiatric Specialty Exam:   Last menstrual period 07/24/2014.There is no height or weight on file to calculate BMI.  General Appearance: {Appearance:22683}  Eye Contact:  {BHH EYE CONTACT:22684}  Speech:  {Speech:22685}  Volume:  {Volume (PAA):22686}  Mood:  {BHH MOOD:22306}  Affect:  {Affect (PAA):22687}  Thought Process:  {Thought Process (PAA):22688}  Orientation:  {BHH ORIENTATION (PAA):22689}  Thought Content: {Thought Content:22690}   Suicidal Thoughts:  {ST/HT (PAA):22692}  Homicidal Thoughts:  {ST/HT (PAA):22692}  Memory:  {BHH MEMORY:22881}  Judgement:  {Judgement (PAA):22694}  Insight:  {Insight (PAA):22695}  Psychomotor Activity:  {Psychomotor (PAA):22696}  Concentration:  {Concentration:21399}  Recall:  {BHH GOOD/FAIR/POOR:22877}  Fund of Knowledge: {BHH GOOD/FAIR/POOR:22877}  Language: {BHH GOOD/FAIR/POOR:22877}  Akathisia:  {BHH YES OR NO:22294}  Handed:  {Handed:22697}  AIMS (if indicated): {Desc; done/not:10129}  Assets:  {Assets (PAA):22698}  ADL's:  {BHH WUJ'W:11914}  Cognition: {chl bhh cognition:304700322}  Sleep:  {BHH GOOD/FAIR/POOR:22877}   Screenings: AIMS    Flowsheet Row Admission (Discharged) from 03/07/2019 in [facility]PATIENT BE[facility]) [facility]  AIMS Total Score 0 0      AUDIT    Flowsheet Row Admission (Discharged) from 03/07/2019 in [facility] INPATIENT BEHAVIORAL  MEDICINE Admission (Discharged) [facility]  Alcohol Use Disorder Identification Test Final Score (AUDIT) 0 0      GAD-7    Flowsheet Row Office Visit from 03/20/2022 in [facility] [employer] Visit from 03/14/2021 in [facility] [place] [facility] [employer] Visit from 10/20/2020 in [facility] [place] [facility] Visit from 11/06/2019 in [facility] at [employer]  Total GAD-7 Score 8 6 9 5       PHQ2-9    Flowsheet Row Office Visit from 12/20/2022 in [facility] at [facility] Visit from 09/19/2022 in [facility] at [employer] Visit from 03/20/2022 in [facility] at [employer] Video Visit from 03/03/2022 in [facility] at [place] Telephone from 01/25/2022 in [facility] Community Care Coordination  PHQ-2 Total Score 0 0 3 1 1   PHQ-9 Total Score 1 2 9  -- --      Flowsheet Row ED to Hosp-Admission (Discharged) from 06/09/2022 in [facility] Video Visit from 03/03/2022 in [facility] at [place] Video Visit from 12/01/2021 in [facility] at [place]  C-SSRS RISK CATEGORY No Risk No Risk No Risk        Assessment and Plan: ***  Collaboration of Care: Collaboration of Care: [facility] OP Collaboration of Care:[id number]}  Patient/Guardian was advised Release of Information must be obtained prior to any record release in order to collaborate their care with an outside provider. Patient/Guardian was advised if they have not already done so to contact the registration department to sign all necessary forms in order for [place] to release information regarding their care.   Consent: Patient/Guardian gives verbal consent for  treatment and assignment of benefits for services provided during this visit. Patient/Guardian expressed understanding and agreed to proceed.    Diannia Ruder, MD 02/01/2023, 11:25 AM

## 2023-02-01 NOTE — Progress Notes (Signed)
Virtual Visit via Telephone Note  I connected with [name] on 02/01/23 at 11:00 AM EDT by telephone and verified that I am speaking with the correct person using two identifiers.  Location: Patient: home Provider: office   I discussed the limitations, risks, security and privacy concerns of performing an evaluation and management service by telephone and the availability of in person appointments. I also discussed with the patient that there may be a patient responsible charge related to this service. The patient expressed understanding and agreed to proceed.       I discussed the assessment and treatment plan with the patient. The patient was provided an opportunity to ask questions and all were answered. The patient agreed with the plan and demonstrated an understanding of the instructions.   The patient was advised to call back or seek an in-person evaluation if the symptoms worsen or if the condition fails to improve as anticipated.  I provided 15 minutes of non-face-to-face time during this encounter.   Sheila Ruder, MD  [facility] MD/PA/NP OP Progress Note  02/01/2023 11:25 AM [name]  MRN:  [id number]  Chief Complaint:  Chief Complaint  Patient presents with   Depression   Anxiety   HPI: This patient is a 57 year old married white female who lives with her husband in [place].  She is on disability.  The patient returns after 3 months regarding her depression anxiety and ADD.  She states that her husband now is cancer free from his throat cancer but it has already done a lot of damage to his esophagus and larynx.  He is getting immunotherapy and may be able to have surgery to prepare the esophagus so he can eat again.  Right now he has a feeding tube.  However she is gratified that he is cancer free for the moment.  She denies significant depression anxiety or trouble sleeping.  Her focus is good with the Adderall.  She denies any thoughts of  suicide or self-harm Visit Diagnosis:    ICD-10-CM   1. Moderate episode of recurrent major depressive disorder (HCC)  F33.1     2. Attention deficit hyperactivity disorder (ADHD), predominantly hyperactive type  F90.1       Past Psychiatric History:  The patient was hospitalized twice in her 87s and again in 2006 and 2020 for drug overdose with suicidal intent   Past Medical History:  Past Medical History:  Diagnosis Date   ADHD    Alcohol abuse    Anemia    Anxiety    Asthmatic bronchitis    normal PFT/ seen by pulmonary no evidence of COPD   Atypical chest pain 10/05/2017   Back pain    Bilateral swelling of feet    Chest pain    Chest pain on respiration 03/25/2014   Chronic bronchitis (HCC)    Chronic lower back pain    Chronic respiratory failure (HCC) 10/16/2011   Newly 02 dep 24/7 p discharge from [facility] 01/2013  - 03/13/2014  Walked RA  2 laps @ 185 ft each stopped due to  Sob/ aching in legs, thirsty/ no desat @ slow pace    Chronic respiratory failure with hypoxia (HCC)    On 2-3 L of oxygen at home   Cigarette smoker 12/02/2010   Followed in Pulmonary clinic/ [facility]/ [place]   - Limits of effective care reviewed 12/22/2011     Complication of anesthesia    Constipation    COPD (chronic obstructive pulmonary  disease) (HCC)    COPD with chronic bronchitis (HCC) 10/01/2017   Daily headache    Depression    Diabetic peripheral neuropathy (HCC)    DM (diabetes mellitus) type II controlled, neurological manifestation (HCC) 12/02/2010   Drug use    Gallbladder problem    Gastric erosions    EGD 08/2010.   GERD (gastroesophageal reflux disease)    Glaucoma    H/O drug abuse (HCC) 11/12/2017   -- scanned document from outside source: [facility] in [place] which showed UDS positive for Adderall /amphetamine usage as well as positive urine for THC.  This test result was collected 08/11/2016 and reported 10/07/2016. - lso review of  the chart shows another positive amphetamine, THC and METH in the urine back on 10/18/2015 under "care everywhere". ---So    Heavy menses    High cholesterol    History of blood transfusion    "related to low HgB" (10/05/2017)   History of hiatal hernia    HTN (hypertension)    Hyperlipidemia associated with type 2 diabetes mellitus (HCC) 10/18/2015   Hypertension associated with diabetes (HCC) 12/02/2010   D/c acei 12/22/2011 due to psuedowheeze and narcotic dependent cough> ? Improved - 03/14/2014 started bystolic in place of cozar due to cough     IBS (irritable bowel syndrome)    Increased urinary protein excretion    Internal hemorrhoids    Colonoscopy 5/12.   Iron deficiency anemia 10/01/2017   Joint pain    Mixed diabetic hyperlipidemia associated with type 2 diabetes mellitus (HCC) 10/01/2017   On home oxygen therapy    "5L at night" (10/05/2017)   OSA on CPAP    Osteoarthritis    "back" (10/05/2017)   Oxygen dependent 10/16/2011   Pneumonia    "lots of times" (10/05/2017)   PONV (postoperative nausea and vomiting)    Poorly controlled diabetes mellitus (HCC) 11/12/2017   Pulmonary infiltrates 12/22/2011   Followed in Pulmonary clinic/ [facility]/ [place]    - See CT Chest 05/05/11    Shortness of breath    Tachycardia    never had test done since no insurance   Tobacco use disorder-current smoker greater than 40-pack-year history- since age 16 1 ppd 10/01/2017   Type II diabetes mellitus (HCC)    Vitamin D deficiency     Past Surgical History:  Procedure Laterality Date   CESAREAN SECTION  1988; 1989   CHOLECYSTECTOMY OPEN  1990   COLONOSCOPY  09/16/2010   ZHY:[id number] colon/small internal hemorrhoids   ESOPHAGOGASTRODUODENOSCOPY  09/16/2010   SLF: normal/mild gastritis   ESOPHAGOGASTRODUODENOSCOPY N/A 10/19/2014   Procedure: ESOPHAGOGASTRODUODENOSCOPY (EGD);  Surgeon: West Bali, MD;  Location: [facility] ENDO SUITE;  Service: Endoscopy;  Laterality: N/A;  830   FRACTURE SURGERY      HYSTEROSCOPY WITH THERMACHOICE  01/17/2012   Procedure: HYSTEROSCOPY WITH THERMACHOICE;  Surgeon: Lazaro Arms, MD;  Location: [facility] ORS;  Service: Gynecology;  Laterality: N/A;  total therapy time: 9:13sec  D5W  18 ml in, D5W   18ml out, temperture 87degrees celcious   KIDNEY SURGERY     as child for blockages   LEFT HEART CATH AND CORONARY ANGIOGRAPHY N/A 09/08/2019   Procedure: LEFT HEART CATH AND CORONARY ANGIOGRAPHY;  Surgeon: [place]az[place], Peter M, MD;  Location: [facility] INVASIVE CV LAB;  Service: Cardiovascular;  Laterality: N/A;   TONSILLECTOMY     TUBAL LIGATION  1989   TYMPANOSTOMY TUBE PLACEMENT Bilateral    "  several times when I was a child"   uterine ablation     WRIST FRACTURE SURGERY Left 1995    Family Psychiatric History: See below  Family History:  Family History  Problem Relation Age of Onset   Heart attack Mother 21       deceased   Diabetes Mother    Breast cancer Mother 3       inflammatory breast ca   Heart failure Mother        oxygen dependence, nonsmoker   Heart disease Mother    Depression Mother    Cancer Mother        Breast   Hypertension Mother    Hyperlipidemia Mother    Sudden death Mother    Sleep apnea Mother    Obesity Mother    Heart attack Father 65       deceased, etoh use   Heart disease Father    Alcohol abuse Father    Depression Father    Heart failure Father    Sudden death Father    Ulcers Sister    Hypertension Sister    Heart failure Sister    Depression Sister    Anxiety disorder Sister    Liver disease Maternal Aunt 40       died while on liver transplant list   Liver disease Maternal Uncle    Cancer Paternal Aunt        Breast   Heart disease Maternal Grandmother    Heart attack Maternal Grandmother        premature CAD   Colon cancer Neg Hx     Social History:  Social History   Socioeconomic History   Marital status: Married    Spouse name: Not on file   Number of children: 2   Years of education: Not on file    Highest education level: Not on file  Occupational History   Occupation: unemployed  Tobacco Use   Smoking status: Every Day    Current packs/day: 1.50    Average packs/day: 1.5 packs/day for 44.8 years (67.2 ttl pk-yrs)    Types: Cigarettes, E-cigarettes    Start date: 1980   Smokeless tobacco: Current   Tobacco comments:    vape  Vaping Use   Vaping status: Every Day  Substance and Sexual Activity   Alcohol use: Not Currently   Drug use: Yes    Types: Marijuana    Comment: occ   Sexual activity: Not Currently    Partners: Male    Birth control/protection: Surgical    Comment: BTL, younger than 16, more than 5  Other Topics Concern   Not on file  Social History Narrative   Has 2 step children.   Lives in multi-family home with children and grandchildren.   On disability.   Social Determinants of Health   Financial Resource Strain: Low Risk  (06/29/2022)   Overall Financial Resource Strain (CARDIA)    Difficulty of Paying Living Expenses: Not hard at all  Food Insecurity: No Food Insecurity (06/14/2022)   Hunger Vital Sign    Worried About Running Out of Food in the Last Year: Never true    Ran Out of Food in the Last Year: Never true  Transportation Needs: No Transportation Needs (06/29/2022)   PRAPARE - Administrator, Civil Service (Medical): No    Lack of Transportation (Non-Medical): No  Physical Activity: Inactive (10/30/2019)   Exercise Vital Sign    Days of Exercise per  Week: 0 days    Minutes of Exercise per Session: 0 min  Stress: Not on file  Social Connections: Moderately Isolated (10/30/2019)   Social Connection and Isolation Panel [NHANES]    Frequency of Communication with Friends and Family: More than three times a week    Frequency of Social Gatherings with Friends and Family: More than three times a week    Attends Religious Services: Never    Database administrator or Organizations: No    Attends Banker Meetings: Never     Marital Status: Married    Allergies:  Allergies  Allergen Reactions   Codeine Itching and Nausea Only   Metformin And Related Diarrhea, Nausea Only and Other (See Comments)    Stomach pain/nausea   Wellbutrin [Bupropion Hcl] Hives   Acyclovir And Related Rash    Metabolic Disorder Labs: Lab Results  Component Value Date   HGBA1C 6.7 (H) 12/20/2022   MPG 177.16 06/02/2021   MPG 188.64 03/05/2019   No results found for: "PROLACTIN" Lab Results  Component Value Date   CHOL 119 12/14/2021   TRIG 86.0 12/14/2021   HDL 44.10 12/14/2021   CHOLHDL 3 12/14/2021   VLDL 17.2 12/14/2021   LDLCALC 57 12/14/2021   LDLCALC 63 03/14/2021   Lab Results  Component Value Date   TSH 1.860 06/30/2019   TSH 2.40 11/26/2018    Therapeutic Level Labs: No results found for: "LITHIUM" No results found for: "VALPROATE" No results found for: "CBMZ"  Current Medications: Current Outpatient Medications  Medication Sig Dispense Refill   ACCU-CHEK GUIDE test strip USE UP TO FOUR TIMES DAILY AS DIRECTED 200 strip 11   Accu-Chek Softclix Lancets lancets USE UP TO FOUR TIMES DAILY AS DIRECTED 300 each 0   albuterol (PROVENTIL) (2.5 MG/3ML) 0.083% nebulizer solution Take 3 mLs (2.5 mg total) by nebulization every 6 (six) hours as needed for wheezing or shortness of breath. 75 mL 0   albuterol (VENTOLIN HFA) 108 (90 Base) MCG/ACT inhaler Inhale 2 puffs into the lungs every 6 (six) hours as needed for wheezing or shortness of breath. 8 g 6   amphetamine-dextroamphetamine (ADDERALL) 30 MG tablet Take 1 tablet by mouth 2 (two) times daily. 60 tablet 0   amphetamine-dextroamphetamine (ADDERALL) 30 MG tablet Take 1 tablet by mouth 2 (two) times daily. 60 tablet 0   amphetamine-dextroamphetamine (ADDERALL) 30 MG tablet Take 1 tablet by mouth 2 (two) times daily. 60 tablet 0   ARIPiprazole (ABILIFY) 5 MG tablet Take 1 tablet (5 mg total) by mouth daily. 90 tablet 1   atorvastatin (LIPITOR) 40 MG tablet  TAKE 0.5 TABLETS (20 MG TOTAL) BY MOUTH 2 (TWO) TIMES DAILY. 90 tablet 3   bismuth subsalicylate (PEPTO BISMOL) 262 MG/15ML suspension Take 30 mLs by mouth every 6 (six) hours as needed for indigestion.     blood glucose meter kit and supplies Dispense based on patient and insurance preference. Use up to four times daily as directed. (FOR ICD-10 E10.9, E11.9). 1 each 0   clonazePAM (KLONOPIN) 0.5 MG tablet Take 1 tablet (0.5 mg total) by mouth 2 (two) times daily as needed for anxiety. 60 tablet 2   Dextromethorphan-guaiFENesin (MUCINEX DM) 30-600 MG TB12 Take 1 tablet by mouth every 12 (twelve) hours as needed (for coughing or to loosen mucus).     DULoxetine (CYMBALTA) 60 MG capsule Take 1 capsule (60 mg total) by mouth in the morning. 90 capsule 2   fluticasone (FLONASE) 50 MCG/ACT nasal  spray PLACE 1 SPRAY INTO BOTH NOSTRILS 2 (TWO) TIMES DAILY AS NEEDED FOR ALLERGIES OR RHINITIS (AFTER SINUS RINSE). (Patient taking differently: Place 1 spray into both nostrils in the morning and at bedtime.) 48 mL 2   glipiZIDE (GLUCOTROL) 5 MG tablet TAKE 1 TABLET(5 MG) BY MOUTH TWICE DAILY BEFORE A MEAL 60 tablet 3   ibuprofen (ADVIL) 200 MG tablet Take 600 mg by mouth every 6 (six) hours as needed for mild pain or headache.     insulin glargine (LANTUS) 100 UNIT/ML injection Inject 0.2 mLs (20 Units total) into the skin at bedtime. 10 mL 6   Insulin Pen Needle (BD PEN NEEDLE NANO 2ND GEN) 32G X 4 MM MISC USE DAILY WITH VICTOZA 100 each 11   Insulin Syringe-Needle U-100 (INSULIN SYRINGE .5CC/31GX5/16") 31G X 5/16" 0.5 ML MISC USE AS DIRECTED ONCE DAILY 100 each 2   ipratropium-albuterol (DUONEB) 0.5-2.5 (3) MG/3ML SOLN Take 3 mLs by nebulization 3 (three) times daily. 360 mL 3   losartan (COZAAR) 25 MG tablet TAKE 1 TABLET DAILY 90 tablet 3   meloxicam (MOBIC) 15 MG tablet Take 15 mg by mouth daily.     pantoprazole (PROTONIX) 40 MG tablet TAKE 1 TABLET BY MOUTH TWICE A DAY 180 tablet 1   pregabalin (LYRICA)  150 MG capsule TAKE 1 CAPSULE BY MOUTH TWICE A DAY 180 capsule 3   Semaglutide, 2 MG/DOSE, (OZEMPIC, 2 MG/DOSE,) 8 MG/3ML SOPN INJECT 2 MG AS DIRECTED ONCE A WEEK. 8 mL 6   umeclidinium-vilanterol (ANORO ELLIPTA) 62.5-25 MCG/ACT AEPB Inhale 1 puff into the lungs daily. 30 each 11   No current facility-administered medications for this visit.     Musculoskeletal: Strength & Muscle Tone: na Gait & Station: na Patient leans: N/A  Psychiatric Specialty Exam:   Last menstrual period 07/24/2014.There is no height or weight on file to calculate BMI.  General Appearance: NA  Eye Contact:  NA  Speech:  Clear and Coherent  Volume:  Normal  Mood:  Euthymic  Affect:  Congruent  Thought Process:  Goal Directed  Orientation:  Full (Time, Place, and Person)  Thought Content: WDL   Suicidal Thoughts:  No  Homicidal Thoughts:  No  Memory:  Immediate;   Good Recent;   Good Remote;   NA  Judgement:  Good  Insight:  Good  Psychomotor Activity:  Normal  Concentration:  Concentration: Good and Attention Span: Good  Recall:  Good  Fund of Knowledge: Good  Language: Good  Akathisia:  No  Handed:  Right  AIMS (if indicated): not done  Assets:  Communication Skills Desire for Improvement Resilience Social Support  ADL's:  Intact  Cognition: WNL  Sleep:  Good   Screenings: AIMS    Flowsheet Row Admission (Discharged) from 03/07/2019 in [facility]  AIMS Total Score 0 0      AUDIT    Flowsheet Row Admission (Discharged) from 03/07/2019 in [facility]PAT[facility]IC[facility] Admission (Discharged[facility]  Alcohol Use Disorder Identification Test Final Score (AUDIT) 0 0      GAD-7    Flowsheet Row Office Visit from 03/20/2022 in [facility] [place] [facility] [employer] Visit from 03/14/2021 in [facility] [place] [facility] [employer] Visit from 10/20/2020 in [facility] [place] [facility] [employer] Visit from 11/06/2019 in [facility] [place] [facility] [place]  Total GAD-7 Score 8 6 9 5       PHQ2-9    Flowsheet Row Office Visit from 12/20/2022 in [facility] [place] [facility] Visit from 09/19/2022 in [facility] [place] [facility] Visit from 03/20/2022 in [facility] [place] [facility] [employer] Video Visit from 03/03/2022 in [facility] [place] Telephone from 01/25/2022 in [facility] Care Coordination  PHQ-2 Total Score 0 0 3 1 1   PHQ-9 Total Score 1 2 9  -- --      Flowsheet Row ED to Hosp-Admission (Discharged) from 06/09/2022 in [facility] Video Visit from 03/03/2022 in [facility] [place] Video Visit from 12/01/2021 in [facility] at [place]  C-SSRS RISK CATEGORY No Risk No Risk No Risk        Assessment and Plan: This patient is a 57 year old female with a history of depression and anxiety and ADD.  She is stable on her current regimen.  She will continue Cymbalta 60 mg daily for depression, clonazepam 0.5 mg twice daily for anxiety, Abilify 5 mg daily for antidepressant augmentation and Adderall 30 mg twice daily for ADD.  She will return to see me in 3 months  Collaboration of Care: Collaboration of Care: Primary Care Provider AEB notes are shared with PCP on the epic system  Patient/Guardian was advised Release of Information must be obtained prior to any record release in order to collaborate their care with an outside provider. Patient/Guardian was advised if they have not already done so to contact the registration department to sign all necessary forms in order for [place] to release information regarding  their care.   Consent: Patient/Guardian gives verbal consent for treatment and assignment of benefits for services provided during this visit. Patient/Guardian expressed understanding and agreed to proceed.    Sheila Ruder, MD 02/01/2023, 11:25 AM

## 2023-02-09 ENCOUNTER — Ambulatory Visit (INDEPENDENT_AMBULATORY_CARE_PROVIDER_SITE_OTHER): Payer: Medicare HMO

## 2023-02-09 DIAGNOSIS — Z0000 Encounter for general adult medical examination without abnormal findings: Secondary | ICD-10-CM

## 2023-02-09 NOTE — Patient Instructions (Signed)
[name] , Thank you for taking time to come for your Medicare Wellness Visit. I appreciate your ongoing commitment to your health goals. Please review the following plan we discussed and let me know if I can assist you in the future.   Referrals/Orders/Follow-Ups/Clinician Recommendations: none  This is a list of the screening recommended for you and due dates:  Health Maintenance  Topic Date Due   Zoster (Shingles) Vaccine (1 of 2) Never done   Eye exam for diabetics  05/25/2021   Mammogram  11/23/2022   Screening for Lung Cancer  05/23/2023   Yearly kidney function blood test for diabetes  06/21/2023   Complete foot exam   06/21/2023   Hemoglobin A1C  06/22/2023   Colon Cancer Screening  08/23/2023   Yearly kidney health urinalysis for diabetes  12/20/2023   Medicare Annual Wellness Visit  02/09/2024   Pap with HPV screening  02/24/2024   DTaP/Tdap/Td vaccine (2 - Td or Tdap) 03/11/2028   Flu Shot  Completed   Hepatitis C Screening  Completed   HIV Screening  Completed   HPV Vaccine  Aged Out   COVID-19 Vaccine  Discontinued    Advanced directives: (ACP Link)Information on Advanced Care Planning can be found at [facility] Advance Health Care Directives Advance Health Care Directives ([email or web address]   Next Medicare Annual Wellness Visit scheduled for next year: Yes  insert Preventive Care Attachment Reference

## 2023-02-09 NOTE — Progress Notes (Signed)
Subjective:   [name] is a 57 y.o. female who presents for Medicare Annual (Subsequent) preventive examination.  Visit Complete: Virtual I connected with  Kang Suszanne Finch on 02/09/23 by a audio enabled telemedicine application and verified that I am speaking with the correct person using two identifiers.  Patient Location: Home  Provider Location: Office/Clinic  I discussed the limitations of evaluation and management by telemedicine. The patient expressed understanding and agreed to proceed.  Vital Signs: Because this visit was a virtual/telehealth visit, some criteria may be missing or patient reported. Any vitals not documented were not able to be obtained and vitals that have been documented are patient reported.    Cardiac Risk Factors include: diabetes mellitus;dyslipidemia;hypertension     Objective:    Today's Vitals   There is no height or weight on file to calculate BMI.     02/09/2023   10:37 AM 06/09/2022    5:17 PM 06/09/2022   11:21 AM 06/02/2021   10:44 AM 10/30/2019    3:17 PM 08/15/2019    1:18 PM 03/08/2019    2:02 AM  Advanced Directives  Does Patient Have a Medical Advance Directive? No  No No No No   Would patient like information on creating a medical advance directive?  No - Patient declined  No - Patient declined Yes (MAU/Ambulatory/Procedural Areas - Information given)       Information is confidential and restricted. Go to Review Flowsheets to unlock data.    Current Medications (verified) Outpatient Encounter Medications as of 02/09/2023  Medication Sig   ACCU-CHEK GUIDE test strip USE UP TO FOUR TIMES DAILY AS DIRECTED   Accu-Chek Softclix Lancets lancets USE UP TO FOUR TIMES DAILY AS DIRECTED   albuterol (VENTOLIN HFA) 108 (90 Base) MCG/ACT inhaler Inhale 2 puffs into the lungs every 6 (six) hours as needed for wheezing or shortness of breath.   amphetamine-dextroamphetamine (ADDERALL) 30 MG tablet Take 1 tablet by mouth  2 (two) times daily.   amphetamine-dextroamphetamine (ADDERALL) 30 MG tablet Take 1 tablet by mouth 2 (two) times daily.   amphetamine-dextroamphetamine (ADDERALL) 30 MG tablet Take 1 tablet by mouth 2 (two) times daily.   ARIPiprazole (ABILIFY) 5 MG tablet Take 1 tablet (5 mg total) by mouth daily.   atorvastatin (LIPITOR) 40 MG tablet TAKE 0.5 TABLETS (20 MG TOTAL) BY MOUTH 2 (TWO) TIMES DAILY.   bismuth subsalicylate (PEPTO BISMOL) 262 MG/15ML suspension Take 30 mLs by mouth every 6 (six) hours as needed for indigestion.   blood glucose meter kit and supplies Dispense based on patient and insurance preference. Use up to four times daily as directed. (FOR ICD-10 E10.9, E11.9).   clonazePAM (KLONOPIN) 0.5 MG tablet Take 1 tablet (0.5 mg total) by mouth 2 (two) times daily as needed for anxiety.   Dextromethorphan-guaiFENesin (MUCINEX DM) 30-600 MG TB12 Take 1 tablet by mouth every 12 (twelve) hours as needed (for coughing or to loosen mucus).   DULoxetine (CYMBALTA) 60 MG capsule Take 1 capsule (60 mg total) by mouth in the morning.   fluticasone (FLONASE) 50 MCG/ACT nasal spray PLACE 1 SPRAY INTO BOTH NOSTRILS 2 (TWO) TIMES DAILY AS NEEDED FOR ALLERGIES OR RHINITIS (AFTER SINUS RINSE). (Patient taking differently: Place 1 spray into both nostrils in the morning and at bedtime.)   glipiZIDE (GLUCOTROL) 5 MG tablet TAKE 1 TABLET(5 MG) BY MOUTH TWICE DAILY BEFORE A MEAL   ibuprofen (ADVIL) 200 MG tablet Take 600 mg by mouth every 6 (six) hours  as needed for mild pain or headache.   insulin glargine (LANTUS) 100 UNIT/ML injection Inject 0.2 mLs (20 Units total) into the skin at bedtime.   Insulin Pen Needle (BD PEN NEEDLE NANO 2ND GEN) 32G X 4 MM MISC USE DAILY WITH VICTOZA   Insulin Syringe-Needle U-100 (INSULIN SYRINGE .5CC/31GX5/16") 31G X 5/16" 0.5 ML MISC USE AS DIRECTED ONCE DAILY   ipratropium-albuterol (DUONEB) 0.5-2.5 (3) MG/3ML SOLN Take 3 mLs by nebulization 3 (three) times daily.    losartan (COZAAR) 25 MG tablet TAKE 1 TABLET DAILY   meloxicam (MOBIC) 15 MG tablet Take 15 mg by mouth daily.   pantoprazole (PROTONIX) 40 MG tablet TAKE 1 TABLET BY MOUTH TWICE A DAY   pregabalin (LYRICA) 150 MG capsule TAKE 1 CAPSULE BY MOUTH TWICE A DAY   Semaglutide, 2 MG/DOSE, (OZEMPIC, 2 MG/DOSE,) 8 MG/3ML SOPN INJECT 2 MG AS DIRECTED ONCE A WEEK.   albuterol (PROVENTIL) (2.5 MG/3ML) 0.083% nebulizer solution Take 3 mLs (2.5 mg total) by nebulization every 6 (six) hours as needed for wheezing or shortness of breath.   umeclidinium-vilanterol (ANORO ELLIPTA) 62.5-25 MCG/ACT AEPB Inhale 1 puff into the lungs daily. (Patient not taking: Reported on 02/09/2023)   No facility-administered encounter medications on file as of 02/09/2023.    Allergies (verified) Codeine, Metformin and related, Wellbutrin [bupropion hcl], and Acyclovir and related   History: Past Medical History:  Diagnosis Date   ADHD    Alcohol abuse    Anemia    Anxiety    Asthmatic bronchitis    normal PFT/ seen by pulmonary no evidence of COPD   Atypical chest pain 10/05/2017   Back pain    Bilateral swelling of feet    Chest pain    Chest pain on respiration 03/25/2014   Chronic bronchitis (HCC)    Chronic lower back pain    Chronic respiratory failure (HCC) 10/16/2011   Newly 02 dep 24/7 p discharge from [facility] 01/2013  - 03/13/2014  Walked RA  2 laps @ 185 ft each stopped due to  Sob/ aching in legs, thirsty/ no desat @ slow pace    Chronic respiratory failure with hypoxia (HCC)    On 2-3 L of oxygen at home   Cigarette smoker 12/02/2010   Followed in Pulmonary clinic/ [facility]/ [place]   - Limits of effective care reviewed 12/22/2011     Complication of anesthesia    Constipation    COPD (chronic obstructive pulmonary disease) (HCC)    COPD with chronic bronchitis (HCC) 10/01/2017   Daily headache    Depression    Diabetic peripheral neuropathy (HCC)    DM (diabetes mellitus) type II controlled,  neurological manifestation (HCC) 12/02/2010   Drug use    Gallbladder problem    Gastric erosions    EGD 08/2010.   GERD (gastroesophageal reflux disease)    Glaucoma    H/O drug abuse (HCC) 11/12/2017   -- scanned document from outside source: [facility] in [place] which showed UDS positive for Adderall /amphetamine usage as well as positive urine for THC.  This test result was collected 08/11/2016 and reported 10/07/2016. - lso review of the chart shows another positive amphetamine, THC and METH in the urine back on 10/18/2015 under "care everywhere". ---So    Heavy menses    High cholesterol    History of blood transfusion    "related to low HgB" (10/05/2017)   History of hiatal hernia  HTN (hypertension)    Hyperlipidemia associated with type 2 diabetes mellitus (HCC) 10/18/2015   Hypertension associated with diabetes (HCC) 12/02/2010   D/c acei 12/22/2011 due to psuedowheeze and narcotic dependent cough> ? Improved - 03/14/2014 started bystolic in place of cozar due to cough     IBS (irritable bowel syndrome)    Increased urinary protein excretion    Internal hemorrhoids    Colonoscopy 5/12.   Iron deficiency anemia 10/01/2017   Joint pain    Mixed diabetic hyperlipidemia associated with type 2 diabetes mellitus (HCC) 10/01/2017   On home oxygen therapy    "5L at night" (10/05/2017)   OSA on CPAP    Osteoarthritis    "back" (10/05/2017)   Oxygen dependent 10/16/2011   Pneumonia    "lots of times" (10/05/2017)   PONV (postoperative nausea and vomiting)    Poorly controlled diabetes mellitus (HCC) 11/12/2017   Pulmonary infiltrates 12/22/2011   [facility] [place]    - See CT Chest 05/05/11    Shortness of breath    Tachycardia    never had test done since no insurance   Tobacco use disorder-current smoker greater than 40-pack-year history- since age 13 1 ppd 10/01/2017   Type II diabetes mellitus (HCC)    Vitamin  D deficiency    Past Surgical History:  Procedure Laterality Date   CESAREAN SECTION  1988; 1989   CHOLECYSTECTOMY OPEN  1990   COLONOSCOPY  09/16/2010   XLK:GMWNUU colon/small internal hemorrhoids   ESOPHAGOGASTRODUODENOSCOPY  09/16/2010   SLF: normal/mild gastritis   ESOPHAGOGASTRODUODENOSCOPY N/A 10/19/2014   Procedure: ESOPHAGOGASTRODUODENOSCOPY (EGD);  Surgeon: West Bali, MD;  Location: [facility]ITE;  Service: Endoscopy;  Laterality: N/A;  830   FRACTURE SURGERY     HYSTEROSCOPY WITH THERMACHOICE  01/17/2012   Procedure: HYSTEROSCOPY WITH THERMACHOICE;  Surgeon: Lazaro Arms, MD;  Location: [facility] ORS;  Service: Gynecology;  Laterality: N/A;  total therapy time: 9:13sec  D5W  18 ml in, D5W   18ml out, temperture 87degrees celcious   KIDNEY SURGERY     as child for blockages   LEFT HEART CATH AND CORONARY ANGIOGRAPHY N/A 09/08/2019   Procedure: LEFT HEART CATH AND CORONARY ANGIOGRAPHY;  Surgeon: Swazil[place], Peter M, MD;  Location: [facility] INVASIVE CV LAB;  Service: Cardiovascular;  Laterality: N/A;   TONSILLECTOMY     TUBAL LIGATION  1989   TYMPANOSTOMY TUBE PLACEMENT Bilateral    "several times when I was a child"   uterine ablation     WRIST FRACTURE SURGERY Left 1995   Family History  Problem Relation Age of Onset   Heart attack Mother 19       deceased   Diabetes Mother    Breast cancer Mother 57       inflammatory breast ca   Heart failure Mother        oxygen dependence, nonsmoker   Heart disease Mother    Depression Mother    Cancer Mother        Breast   Hypertension Mother    Hyperlipidemia Mother    Sudden death Mother    Sleep apnea Mother    Obesity Mother    Heart attack Father 41       deceased, etoh use   Heart disease Father    Alcohol abuse Father    Depression Father    Heart failure Father    Sudden death Father    Ulcers Sister  Hypertension Sister    Heart failure Sister    Depression Sister    Anxiety disorder Sister    Liver disease Maternal  Aunt 40       died while on liver transplant list   Liver disease Maternal Uncle    Cancer Paternal Aunt        Breast   Heart disease Maternal Grandmother    Heart attack Maternal Grandmother        premature CAD   Colon cancer Neg Hx    Social History   Socioeconomic History   Marital status: Married    Spouse name: Not on file   Number of children: 2   Years of education: Not on file   Highest education level: Not on file  Occupational History   Occupation: unemployed  Tobacco Use   Smoking status: Every Day    Current packs/day: 1.50    Average packs/day: 1.5 packs/day for 44.8 years (67.2 ttl pk-yrs)    Types: Cigarettes, E-cigarettes    Start date: 1980   Smokeless tobacco: Current   Tobacco comments:    vape  Vaping Use   Vaping status: Every Day  Substance and Sexual Activity   Alcohol use: Not Currently   Drug use: Yes    Types: Marijuana    Comment: occ   Sexual activity: Not Currently    Partners: Male    Birth control/protection: Surgical    Comment: BTL, younger than 86, more than 5  Other Topics Concern   Not on file  Social History Narrative   Has 2 step children.   Lives in multi-family home with children and grandchildren.   On disability.   Social Determinants of Health   Financial Resource Strain: Low Risk  (02/09/2023)   Overall Financial Resource Strain (CARDIA)    Difficulty of Paying Living Expenses: Not hard at all  Food Insecurity: No Food Insecurity (02/09/2023)   Hunger Vital Sign    Worried About Running Out of Food in the Last Year: Never true    Ran Out of Food in the Last Year: Never true  Transportation Needs: No Transportation Needs (02/09/2023)   PRAPARE - Administrator, Civil Service (Medical): No    Lack of Transportation (Non-Medical): No  Physical Activity: Inactive (02/09/2023)   Exercise Vital Sign    Days of Exercise per Week: 0 days    Minutes of Exercise per Session: 0 min  Stress: No Stress Concern  Present (02/09/2023)   [employer] of Occupational Health - Occupational Stress Questionnaire    Feeling of Stress : Only a little  Social Connections: Moderately Integrated (02/09/2023)   Social Connection and Isolation Panel [NHANES]    Frequency of Communication with Friends and Family: More than three times a week    Frequency of Social Gatherings with Friends and Family: Not on file    Attends Religious Services: More than 4 times per year    Active Member of [employer] or Organizations: No    Attends Engineer, structural: Never    Marital Status: Married    Tobacco Counseling Ready to quit: Yes Counseling given: Not Answered Tobacco comments: vape   Clinical Intake:  Pre-visit preparation completed: Yes  Pain : No/denies pain     Nutritional Risks: None Diabetes: Yes CBG done?: No Did pt. bring in CBG monitor from home?: No  How often do you need to have someone help you when you read instructions, pamphlets, or other written  materials from your doctor or pharmacy?: 1 - Never  Interpreter Needed?: No  Information entered by :: NAllen LPN   Activities of Daily Living    02/09/2023   10:31 AM 06/09/2022    5:17 PM  In your present state of health, do you have any difficulty performing the following activities:  Hearing? 1 0  Comment no hearing aids   Vision? 1 0  Comment has cataracts   Difficulty concentrating or making decisions? 0 0  Walking or climbing stairs? 0 0  Dressing or bathing? 0 0  Doing errands, shopping? 0 0  Preparing Food and eating ? N   Using the Toilet? N   In the past six months, have you accidently leaked urine? N   Do you have problems with loss of bowel control? N   Managing your Medications? N   Managing your Finances? N   Housekeeping or managing your Housekeeping? N     Patient Care Team: Loyola Mast, MD as PCP - General (Family Medicine) West Bali, MD (Inactive) (Gastroenterology) Myrlene Broker, MD  as Consulting Physician [facility]) Lyn Records, MD (Inactive) as Consulting Physician (Cardiology) Nyoka Cowden, MD as Consulting Physician (Pulmonary Disease) Genia Del, MD as Consulting Physician (Obstetrics and Gynecology) Gaspar Cola, [facility] (Inactive) as Pharmacist (Pharmacist)  Indicate any recent Medical Services you may have received from other than [facility] providers in the past year (date may be approximate).     Assessment:   This is a routine wellness examination for Charleen.  Hearing/Vision screen Hearing Screening - Comments:: Does not heave hearing aids Vision Screening - Comments:: No regular eye exams, [facility]   Goals Addressed             This Visit's Progress    Patient Stated       02/09/2023, wants to lose weight and eat better       Depression Screen    02/09/2023   10:39 AM 12/20/2022   10:51 AM 12/20/2022   10:50 AM 09/19/2022    1:37 PM 03/20/2022   11:36 AM 03/03/2022   10:49 AM 01/25/2022   12:09 PM  PHQ 2/9 Scores  PHQ - 2 Score 0 0 0 0 3  1  PHQ- 9 Score 2 1  2 9        Information is confidential and restricted. Go to Review Flowsheets to unlock data.    Fall Risk    02/09/2023   10:38 AM 12/20/2022   10:50 AM 09/19/2022    1:36 PM 06/16/2021   10:50 AM 10/20/2020    3:53 PM  Fall Risk   Falls in the past year? 1 0 0 1 1  Comment tripped in hole      Number falls in past yr: 0 0 0 1 1  Injury with Fall? 0 0 0 0 1  Risk for fall due to : Medication side effect No Fall Risks No Fall Risks History of fall(s) History of fall(s)  Follow up Falls prevention discussed;Falls evaluation completed Falls evaluation completed Falls evaluation completed Falls evaluation completed Falls evaluation completed    MEDICARE RISK AT HOME: Medicare Risk at Home Any stairs in or around the home?: Yes If so, are there any without handrails?: No Home free of loose throw rugs in walkways, pet beds, electrical cords, etc?:  Yes Adequate lighting in your home to reduce risk of falls?: Yes Life alert?: No Use of a cane, walker or  w/c?: No Grab bars in the bathroom?: No Shower chair or bench in shower?: No Elevated toilet seat or a handicapped toilet?: No  TIMED UP AND GO:  Was the test performed?  No    Cognitive Function:        02/09/2023   10:40 AM  6CIT Screen  What Year? 0 points  What month? 0 points  What time? 0 points  Count back from 20 0 points  Months in reverse 0 points  Repeat phrase 0 points  Total Score 0 points    Immunizations Immunization History  Administered Date(s) Administered   Influenza Split 01/11/2012, 04/08/2016   Influenza, Seasonal, Injecte, Preservative Fre 12/20/2022   Influenza,inj,Quad PF,6+ Mos 12/31/2012, 01/16/2014, 03/10/2015, 03/11/2018, 03/13/2019, 03/14/2021, 03/20/2022   Influenza-Unspecified 01/29/2017   PFIZER(Purple Top)SARS-COV-2 Vaccination 10/17/2019, 11/11/2019   Pneumococcal Polysaccharide-23 02/20/2012   Tdap 03/11/2018    TDAP status: Up to date  Flu Vaccine status: Up to date  Pneumococcal vaccine status: Up to date  Covid-19 vaccine status: Completed vaccines  Qualifies for Shingles Vaccine? Yes   Zostavax completed No   Shingrix Completed?: No.    Education has been provided regarding the importance of this vaccine. Patient has been advised to call insurance company to determine out of pocket expense if they have not yet received this vaccine. Advised may also receive vaccine at local pharmacy or Health Dept. Verbalized acceptance and understanding.  Screening Tests Health Maintenance  Topic Date Due   Zoster Vaccines- Shingrix (1 of 2) Never done   OPHTHALMOLOGY EXAM  05/25/2021   MAMMOGRAM  11/23/2022   Lung Cancer Screening  05/23/2023   Diabetic kidney evaluation - eGFR measurement  06/21/2023   FOOT EXAM  06/21/2023   HEMOGLOBIN A1C  06/22/2023   Colonoscopy  08/23/2023   Diabetic kidney evaluation - Urine ACR   12/20/2023   Medicare Annual Wellness (AWV)  02/09/2024   Cervical Cancer Screening (HPV/Pap Cotest)  02/24/2024   DTaP/Tdap/Td (2 - Td or Tdap) 03/11/2028   INFLUENZA VACCINE  Completed   Hepatitis C Screening  Completed   HIV Screening  Completed   HPV VACCINES  Aged Out   COVID-19 Vaccine  Discontinued    Health Maintenance  Health Maintenance Due  Topic Date Due   Zoster Vaccines- Shingrix (1 of 2) Never done   OPHTHALMOLOGY EXAM  05/25/2021   MAMMOGRAM  11/23/2022    Colorectal cancer screening: Type of screening: Colonoscopy. Completed 08/22/2013. Repeat every 10 years  Mammogram status: Ordered 12/20/2022. Pt provided with contact info and advised to call to schedule appt.   Bone Density status: n/a  Lung Cancer Screening: (Low Dose CT Chest recommended if Age 24-80 years, 20 pack-year currently smoking OR have quit w/in 15years.) does qualify.   Lung Cancer Screening Referral: CT scan 05/22/2022  Additional Screening:  Hepatitis C Screening: does qualify; Completed 03/20/2022  Vision Screening: Recommended annual ophthalmology exams for early detection of glaucoma and other disorders of the eye. Is the patient up to date with their annual eye exam?  No  Who is the provider or what is the name of the office in which the patient attends annual eye exams? [facility] If pt is not established with a provider, would they like to be referred to a provider to establish care? No .   Dental Screening: Recommended annual dental exams for proper oral hygiene  Diabetic Foot Exam: Diabetic Foot Exam: Completed 06/20/2022  Community Resource Referral / Chronic Care Management: CRR  required this visit?  No   CCM required this visit?  No     Plan:     I have personally reviewed and noted the following in the patient's chart:   Medical and social history Use of alcohol, tobacco or illicit drugs  Current medications and supplements including opioid prescriptions.  Patient is not currently taking opioid prescriptions. Functional ability and status Nutritional status Physical activity Advanced directives List of other physicians Hospitalizations, surgeries, and ER visits in previous 12 months Vitals Screenings to include cognitive, depression, and falls Referrals and appointments  In addition, I have reviewed and discussed with patient certain preventive protocols, quality metrics, and best practice recommendations. A written personalized care plan for preventive services as well as general preventive health recommendations were provided to patient.     Barb Merino, LPN   04/26/7251   After Visit Summary: (MyChart) Due to this being a telephonic visit, the after visit summary with patients personalized plan was offered to patient via MyChart   Nurse Notes: none

## 2023-02-15 DIAGNOSIS — H25813 Combined forms of age-related cataract, bilateral: Secondary | ICD-10-CM | POA: Diagnosis not present

## 2023-02-21 ENCOUNTER — Inpatient Hospital Stay: Admission: RE | Admit: 2023-02-21 | Payer: Medicare HMO | Source: Ambulatory Visit

## 2023-03-01 DIAGNOSIS — H25813 Combined forms of age-related cataract, bilateral: Secondary | ICD-10-CM | POA: Diagnosis not present

## 2023-03-16 DIAGNOSIS — H25812 Combined forms of age-related cataract, left eye: Secondary | ICD-10-CM | POA: Diagnosis not present

## 2023-03-18 HISTORY — PX: OTHER SURGICAL HISTORY: SHX169

## 2023-03-22 ENCOUNTER — Other Ambulatory Visit: Payer: Self-pay | Admitting: Family Medicine

## 2023-03-22 DIAGNOSIS — M5442 Lumbago with sciatica, left side: Secondary | ICD-10-CM

## 2023-03-22 DIAGNOSIS — M25511 Pain in right shoulder: Secondary | ICD-10-CM

## 2023-03-26 ENCOUNTER — Ambulatory Visit: Payer: Medicare HMO | Admitting: Family Medicine

## 2023-03-26 ENCOUNTER — Telehealth: Payer: Self-pay | Admitting: Family Medicine

## 2023-03-26 NOTE — Telephone Encounter (Signed)
No transportation °

## 2023-03-27 NOTE — Telephone Encounter (Signed)
1st missed visit, letter sent via mychart  We contacted pt at 9:33am due to her texting no (to cancel). She said to disregard and she would be here. Pt called at 12:47p saying she did not have transportation for her 1:00p appt.

## 2023-03-30 ENCOUNTER — Encounter: Payer: Self-pay | Admitting: Family Medicine

## 2023-03-30 ENCOUNTER — Ambulatory Visit (INDEPENDENT_AMBULATORY_CARE_PROVIDER_SITE_OTHER): Payer: Medicare HMO | Admitting: Family Medicine

## 2023-03-30 VITALS — BP 126/68 | HR 95 | Temp 97.2°F | Ht 64.0 in | Wt 271.0 lb

## 2023-03-30 DIAGNOSIS — I10 Essential (primary) hypertension: Secondary | ICD-10-CM | POA: Diagnosis not present

## 2023-03-30 DIAGNOSIS — E1142 Type 2 diabetes mellitus with diabetic polyneuropathy: Secondary | ICD-10-CM

## 2023-03-30 DIAGNOSIS — Z7985 Long-term (current) use of injectable non-insulin antidiabetic drugs: Secondary | ICD-10-CM | POA: Diagnosis not present

## 2023-03-30 DIAGNOSIS — Z1231 Encounter for screening mammogram for malignant neoplasm of breast: Secondary | ICD-10-CM

## 2023-03-30 DIAGNOSIS — Z794 Long term (current) use of insulin: Secondary | ICD-10-CM

## 2023-03-30 DIAGNOSIS — Z7984 Long term (current) use of oral hypoglycemic drugs: Secondary | ICD-10-CM | POA: Diagnosis not present

## 2023-03-30 DIAGNOSIS — E782 Mixed hyperlipidemia: Secondary | ICD-10-CM

## 2023-03-30 DIAGNOSIS — J4489 Other specified chronic obstructive pulmonary disease: Secondary | ICD-10-CM | POA: Diagnosis not present

## 2023-03-30 MED ORDER — GLIPIZIDE 5 MG PO TABS
5.0000 mg | ORAL_TABLET | Freq: Every day | ORAL | Status: DC
Start: 2023-03-30 — End: 2023-06-15

## 2023-03-30 NOTE — Progress Notes (Signed)
[facility] [address] [place] [address] Dept: [phone] Dept Fax: [phone]  Chronic Care Office Visit  Subjective:    Patient ID: [name], female    DOB: Nov 22, 1965, 57 y.o..   MRN: [id number]  Chief Complaint  Patient presents with   Hypertension    3 month f/u HTN.  No concerns.    History of Present Illness:  Patient is in today for reassessment of chronic medical issues.  [name] has a history of Type 2 diabetes. She is managed on glipizide 5 mg bid, insulin glargine 20 units daily and semaglutide (Ozempic) 2 mg weekly. She notes she was having an occasional low blood sugar, so she has reduced her glipizide to once a day. Her diabetes is complicated by peripheral neuropathy. This is managed with Lyrica 150 mg bid.    [name] has a history of hypertension. She is managed on metoprolol 25 mg bid and losartan 25 mg daily.   [name] has a history of COPD with chronic bronchitis. She is managed on albuterol (inhaler and nebulizer) and Duoneb.   Ms. knibbs husband is hospitalized currently with malnutrition/weight loss due to complications of his feeding tube. This was started due to laryngeal cancer resulting in a tracheal stoma. He is having recurrent aspiration issues. His weight is reportedly down to 88 lbs. She is very worried about whether he can survive this.   Past Medical History: Patient Active Problem List   Diagnosis Date Noted   Coronary atherosclerosis 05/23/2022   Aortic atherosclerosis (HCC) 05/23/2022   Arthritis of left knee 12/14/2021   Acute respiratory failure with hypoxia (HCC) 06/02/2021   Marijuana use 03/14/2021   Allergic rhinitis 03/14/2021   Angina pectoris (HCC) 09/08/2019   Benzodiazepine overdose 03/05/2019   Generalized muscle ache 09/24/2018   Chronic right ear pain 03/11/2018   Chronic sinusitis 03/11/2018   H/O drug abuse (HCC) 11/12/2017    Atypical chest pain 10/05/2017   COPD with chronic bronchitis (HCC) 10/01/2017   Mixed hyperlipidemia 10/01/2017   Tobacco use disorder 10/01/2017   Diabetic peripheral neuropathy (HCC) 10/01/2017   Iron deficiency anemia 10/01/2017   Adult attention deficit hyperactivity disorder 01/14/2016   Major depressive disorder, recurrent severe without psychotic features (HCC)    Leukocytosis 03/25/2014   OSA on CPAP 04/11/2013   Perforated ear drum, right 05/22/2012   Bell's palsy 01/23/2012   Carpal tunnel syndrome on both sides 01/11/2012   Vitamin D deficiency 07/06/2011   Asthmatic bronchitis 06/11/2011   Back pain 06/11/2011   Morbid obesity with BMI of 45.0-49.9, adult (HCC) 05/06/2011   Gastric erosions 12/05/2010   Essential hypertension 12/02/2010   Type 2 diabetes mellitus with polyneuropathy (HCC) 12/02/2010   COPD with acute exacerbation (HCC) 11/30/2010   GERD (gastroesophageal reflux disease) 09/08/2010   Esophageal dysphagia 09/08/2010   Past Surgical History:  Procedure Laterality Date   cataract surgery Left 03/18/2023   CESAREAN SECTION  1988; 1989   CHOLECYSTECTOMY OPEN  1990   COLONOSCOPY  09/16/2010   UUV:[id number] colon/small internal hemorrhoids   ESOPHAGOGASTRODUODENOSCOPY  09/16/2010   SLF: normal/mild gastritis   ESOPHAGOGASTRODUODENOSCOPY N/A 10/19/2014   Procedure: ESOPHAGOGASTRODUODENOSCOPY (EGD);  Surgeon: West Bali, MD;  Location: [facility];  Service: Endoscopy;  Laterality: N/A;  830   FRACTURE SURGERY     HYSTEROSCOPY WITH THERMACHOICE  01/17/2012   Procedure: HYSTEROSCOPY WITH THERMACHOICE;  Surgeon: Lazaro Arms, MD;  Location: [facility] ORS;  Service:  Gynecology;  Laterality: N/A;  total therapy time: 9:13sec  D5W  18 ml in, D5W   18ml out, temperture 87degrees celcious   KIDNEY SURGERY     as child for blockages   LEFT HEART CATH AND CORONARY ANGIOGRAPHY N/A 09/08/2019   Procedure: LEFT HEART CATH AND CORONARY ANGIOGRAPHY;  Surgeon: [place], Peter  M, MD;  Location: [facility] INVASIVE CV LAB;  Service: Cardiovascular;  Laterality: N/A;   TONSILLECTOMY     TUBAL LIGATION  1989   TYMPANOSTOMY TUBE PLACEMENT Bilateral    "several times when I was a child"   uterine ablation     WRIST FRACTURE SURGERY Left 1995   Family History  Problem Relation Age of Onset   Heart attack Mother 42       deceased   Diabetes Mother    Breast cancer Mother 18       inflammatory breast ca   Heart failure Mother        oxygen dependence, nonsmoker   Heart disease Mother    Depression Mother    Cancer Mother        Breast   Hypertension Mother    Hyperlipidemia Mother    Sudden death Mother    Sleep apnea Mother    Obesity Mother    Heart attack Father 61       deceased, etoh use   Heart disease Father    Alcohol abuse Father    Depression Father    Heart failure Father    Sudden death Father    Ulcers Sister    Hypertension Sister    Heart failure Sister    Depression Sister    Anxiety disorder Sister    Liver disease Maternal Aunt 40       died while on liver transplant list   Liver disease Maternal Uncle    Cancer Paternal Aunt        Breast   Heart disease Maternal Grandmother    Heart attack Maternal Grandmother        premature CAD   Colon cancer Neg Hx    Outpatient Medications Prior to Visit  Medication Sig Dispense Refill   ACCU-CHEK GUIDE test strip USE UP TO FOUR TIMES DAILY AS DIRECTED 200 strip 11   Accu-Chek Softclix Lancets lancets USE UP TO FOUR TIMES DAILY AS DIRECTED 300 each 0   albuterol (PROVENTIL) (2.5 MG/3ML) 0.083% nebulizer solution Take 3 mLs (2.5 mg total) by nebulization every 6 (six) hours as needed for wheezing or shortness of breath. 75 mL 0   albuterol (VENTOLIN HFA) 108 (90 Base) MCG/ACT inhaler Inhale 2 puffs into the lungs every 6 (six) hours as needed for wheezing or shortness of breath. 8 g 6   amphetamine-dextroamphetamine (ADDERALL) 30 MG tablet Take 1 tablet by mouth 2 (two) times daily. 60 tablet 0    amphetamine-dextroamphetamine (ADDERALL) 30 MG tablet Take 1 tablet by mouth 2 (two) times daily. 60 tablet 0   amphetamine-dextroamphetamine (ADDERALL) 30 MG tablet Take 1 tablet by mouth 2 (two) times daily. 60 tablet 0   ARIPiprazole (ABILIFY) 5 MG tablet Take 1 tablet (5 mg total) by mouth daily. 90 tablet 1   atorvastatin (LIPITOR) 40 MG tablet TAKE 0.5 TABLETS (20 MG TOTAL) BY MOUTH 2 (TWO) TIMES DAILY. 90 tablet 3   bismuth subsalicylate (PEPTO BISMOL) 262 MG/15ML suspension Take 30 mLs by mouth every 6 (six) hours as needed for indigestion.     blood glucose meter kit and supplies  Dispense based on patient and insurance preference. Use up to four times daily as directed. (FOR ICD-10 E10.9, E11.9). 1 each 0   clonazePAM (KLONOPIN) 0.5 MG tablet Take 1 tablet (0.5 mg total) by mouth 2 (two) times daily as needed for anxiety. 60 tablet 2   Dextromethorphan-guaiFENesin (MUCINEX DM) 30-600 MG TB12 Take 1 tablet by mouth every 12 (twelve) hours as needed (for coughing or to loosen mucus).     DULoxetine (CYMBALTA) 60 MG capsule Take 1 capsule (60 mg total) by mouth in the morning. 90 capsule 2   fluticasone (FLONASE) 50 MCG/ACT nasal spray PLACE 1 SPRAY INTO BOTH NOSTRILS 2 (TWO) TIMES DAILY AS NEEDED FOR ALLERGIES OR RHINITIS (AFTER SINUS RINSE). (Patient taking differently: Place 1 spray into both nostrils in the morning and at bedtime.) 48 mL 2   glipiZIDE (GLUCOTROL) 5 MG tablet TAKE 1 TABLET(5 MG) BY MOUTH TWICE DAILY BEFORE A MEAL 60 tablet 3   ibuprofen (ADVIL) 200 MG tablet Take 600 mg by mouth every 6 (six) hours as needed for mild pain or headache.     insulin glargine (LANTUS) 100 UNIT/ML injection Inject 0.2 mLs (20 Units total) into the skin at bedtime. 10 mL 6   Insulin Pen Needle (BD PEN NEEDLE NANO 2ND GEN) 32G X 4 MM MISC USE DAILY WITH VICTOZA 100 each 11   Insulin Syringe-Needle U-100 (INSULIN SYRINGE .5CC/31GX5/16") 31G X 5/16" 0.5 ML MISC USE AS DIRECTED ONCE DAILY 100 each 2    ipratropium-albuterol (DUONEB) 0.5-2.5 (3) MG/3ML SOLN Take 3 mLs by nebulization 3 (three) times daily. 360 mL 3   losartan (COZAAR) 25 MG tablet TAKE 1 TABLET DAILY 90 tablet 3   meloxicam (MOBIC) 15 MG tablet TAKE 1 TABLET (15 MG TOTAL) BY MOUTH DAILY. 90 tablet 3   pantoprazole (PROTONIX) 40 MG tablet TAKE 1 TABLET BY MOUTH TWICE A DAY 180 tablet 1   pregabalin (LYRICA) 150 MG capsule TAKE 1 CAPSULE BY MOUTH TWICE A DAY 180 capsule 3   Semaglutide, 2 MG/DOSE, (OZEMPIC, 2 MG/DOSE,) 8 MG/3ML SOPN INJECT 2 MG AS DIRECTED ONCE A WEEK. 8 mL 6   umeclidinium-vilanterol (ANORO ELLIPTA) 62.5-25 MCG/ACT AEPB Inhale 1 puff into the lungs daily. 30 each 11   No facility-administered medications prior to visit.   Allergies  Allergen Reactions   Codeine Itching and Nausea Only   Metformin And Related Diarrhea, Nausea Only and Other (See Comments)    Stomach pain/nausea   Wellbutrin [Bupropion Hcl] Hives   Acyclovir And Related Rash   Objective:   Today's Vitals   03/30/23 1532  BP: 126/68  Pulse: 95  Temp: (!) 97.2 F (36.2 C)  TempSrc: Temporal  SpO2: 97%  Weight: 271 lb (122.9 kg)  Height: 5\' 4"  (1.626 m)   Body mass index is 46.52 kg/m.   General: Well developed, well nourished. No acute distress. Psych: Alert and oriented. Normal mood and affect.  Health Maintenance Due  Topic Date Due   Zoster Vaccines- Shingrix (1 of 2) Never done   OPHTHALMOLOGY EXAM  05/25/2021   MAMMOGRAM  11/23/2022     Assessment & Plan:   Problem List Items Addressed This Visit       Cardiovascular and Mediastinum   Essential hypertension - Primary    Blood pressure is at goal. Continue metoprolol 25 mg bid and losartan 25 mg daily.        Respiratory   COPD with chronic bronchitis (HCC)    Stable. Continue Duonebs  and albuterol.        Endocrine   Type 2 diabetes mellitus with polyneuropathy (HCC)    I will check her A1c today. Continue glipizide 5 mg daily, insulin glargine 20  units daily and semaglutide (Ozempic) 2 mg weekly. If her A1c is still meeting goal, we might consider stopping her glipizide.      Relevant Medications   glipiZIDE (GLUCOTROL) 5 MG tablet   Other Relevant Orders   Glucose, random   Hemoglobin A1c     Other   Mixed hyperlipidemia    Lipids are at goal. Continue atorvastatin 40 mg daily.      Other Visit Diagnoses     Encounter for screening mammogram for malignant neoplasm of breast       Relevant Orders   MM DIGITAL SCREENING BILATERAL       Return in about 3 months (around 06/28/2023) for Reassessment.   Loyola Mast, MD

## 2023-03-30 NOTE — Assessment & Plan Note (Signed)
Stable. Continue Duonebs and albuterol.

## 2023-03-30 NOTE — Assessment & Plan Note (Signed)
I will check her A1c today. Continue glipizide 5 mg daily, insulin glargine 20 units daily and semaglutide (Ozempic) 2 mg weekly. If her A1c is still meeting goal, we might consider stopping her glipizide.

## 2023-03-30 NOTE — Assessment & Plan Note (Signed)
Blood pressure is at goal. Continue metoprolol 25 mg bid and losartan 25 mg daily.

## 2023-03-30 NOTE — Assessment & Plan Note (Signed)
Lipids are at goal. Continue atorvastatin 40 mg daily.

## 2023-04-02 ENCOUNTER — Encounter: Payer: Self-pay | Admitting: Family Medicine

## 2023-04-02 LAB — HEMOGLOBIN A1C
Est. average glucose Bld gHb Est-mCnc: 140 mg/dL
Hgb A1c MFr Bld: 6.5 % — ABNORMAL HIGH (ref 4.8–5.6)

## 2023-04-02 LAB — GLUCOSE, RANDOM

## 2023-04-02 MED ORDER — "INSULIN SYRINGE 31G X 5/16"" 0.5 ML MISC": 1.0000 [IU] | Freq: Every day | 2 refills | Status: AC

## 2023-04-02 NOTE — Telephone Encounter (Signed)
Rx went to the pharmacy and patient notifed VIA my chart. Dm/cma

## 2023-05-04 ENCOUNTER — Encounter (HOSPITAL_COMMUNITY): Payer: Self-pay | Admitting: Psychiatry

## 2023-05-04 ENCOUNTER — Telehealth (INDEPENDENT_AMBULATORY_CARE_PROVIDER_SITE_OTHER): Payer: Medicare HMO | Admitting: Psychiatry

## 2023-05-04 DIAGNOSIS — F331 Major depressive disorder, recurrent, moderate: Secondary | ICD-10-CM

## 2023-05-04 DIAGNOSIS — F901 Attention-deficit hyperactivity disorder, predominantly hyperactive type: Secondary | ICD-10-CM

## 2023-05-04 MED ORDER — AMPHETAMINE-DEXTROAMPHETAMINE 30 MG PO TABS: 30.0000 mg | ORAL_TABLET | Freq: Two times a day (BID) | ORAL | 0 refills | Status: DC

## 2023-05-04 MED ORDER — DULOXETINE HCL 60 MG PO CPEP: 60.0000 mg | ORAL_CAPSULE | Freq: Every morning | ORAL | 2 refills | Status: DC

## 2023-05-04 MED ORDER — ARIPIPRAZOLE 5 MG PO TABS: 5.0000 mg | ORAL_TABLET | Freq: Every day | ORAL | 1 refills | Status: DC

## 2023-05-04 MED ORDER — CLONAZEPAM 0.5 MG PO TABS: 0.5000 mg | ORAL_TABLET | Freq: Two times a day (BID) | ORAL | 2 refills | Status: DC | PRN

## 2023-05-04 NOTE — Progress Notes (Signed)
 Virtual Visit via Video Note  I connected with Nadeen Meliton Lunger on 05/04/23 at 10:00 AM EST by a video enabled telemedicine application and verified that I am speaking with the correct person using two identifiers.  Location: Patient: home Provider: office   I discussed the limitations of evaluation and management by telemedicine and the availability of in person appointments. The patient expressed understanding and agreed to proceed.     I discussed the assessment and treatment plan with the patient. The patient was provided an opportunity to ask questions and all were answered. The patient agreed with the plan and demonstrated an understanding of the instructions.   The patient was advised to call back or seek an in-person evaluation if the symptoms worsen or if the condition fails to improve as anticipated.  I provided 20 minutes of non-face-to-face time during this encounter.   Barnie Gull, MD  [facility] MD/PA/NP OP Progress Note  05/04/2023 10:16 AM Jhania Modesta Sammons  MRN:  [id number]  Chief Complaint:  Chief Complaint  Patient presents with   Anxiety   Depression   ADD   Follow-up   HPI: This patient is a 58 year old married white female who lives with her husband in [place].  Her daughter and 2 grandsons live there as well.  She is on disability.   The patient returns for follow-up after 3 months regarding her depression and ADD.  She states that she is quite stressed right now.  Her husband is hospitalized due to aspiration pneumonia.  He has had esophageal cancer with chemo and immunotherapy.  He can no longer swallow and is using a J-tube.  He has lost a lot of weight and is under 100 pounds.  He had to be hospitalized last night and was hospitalized much of December.  She is quite worried about him but hopes he will be able to come home.  In terms of her own mood she states she is holding her own.  She denies significant depression or anxiety.  She is  sleeping fairly well and focusing well with the Adderall. Visit Diagnosis:    ICD-10-CM   1. Moderate episode of recurrent major depressive disorder (HCC)  F33.1     2. Attention deficit hyperactivity disorder (ADHD), predominantly hyperactive type  F90.1       Past Psychiatric History: The patient was hospitalized twice in her 83s and again in 2006 and 2020 for drug overdose with suicidal intent   Past Medical History:  Past Medical History:  Diagnosis Date   ADHD    Alcohol abuse    Anemia    Anxiety    Asthmatic bronchitis    normal PFT/ seen by pulmonary no evidence of COPD   Atypical chest pain 10/05/2017   Back pain    Bilateral swelling of feet    Chest pain    Chest pain on respiration 03/25/2014   Chronic bronchitis (HCC)    Chronic lower back pain    Chronic respiratory failure (HCC) 10/16/2011   Newly 02 dep 24/7 p discharge from [facility] 01/2013  - 03/13/2014  Walked RA  2 laps @ 185 ft each stopped due to  Sob/ aching in legs, thirsty/ no desat @ slow pace    Chronic respiratory failure with hypoxia (HCC)    On 2-3 L of oxygen  at home   Cigarette smoker 12/02/2010   Followed in Pulmonary clinic/ [facility]/ [place]   - Limits of effective care reviewed 12/22/2011     Complication  of anesthesia    Constipation    COPD (chronic obstructive pulmonary disease) (HCC)    COPD with chronic bronchitis (HCC) 10/01/2017   Daily headache    Depression    Diabetic peripheral neuropathy (HCC)    DM (diabetes mellitus) type II controlled, neurological manifestation (HCC) 12/02/2010   Drug use    Gallbladder problem    Gastric erosions    EGD 08/2010.   GERD (gastroesophageal reflux disease)    Glaucoma    H/O drug abuse (HCC) 11/12/2017   -- scanned document from outside source: [facility] in [place]  which showed UDS positive for Adderall /amphetamine  usage as well as positive urine for THC.  This test result was collected 08/11/2016 and  reported 10/07/2016. - lso review of the chart shows another positive amphetamine , THC and METH in the urine back on 10/18/2015 under care everywhere. ---So    Heavy menses    High cholesterol    History of blood transfusion    related to low HgB (10/05/2017)   History of hiatal hernia    HTN (hypertension)    Hyperlipidemia associated with type 2 diabetes mellitus (HCC) 10/18/2015   Hypertension associated with diabetes (HCC) 12/02/2010   D/c acei 12/22/2011 due to psuedowheeze and narcotic dependent cough> ? Improved - 03/14/2014 started bystolic in place of cozar due to cough     IBS (irritable bowel syndrome)    Increased urinary protein excretion    Internal hemorrhoids    Colonoscopy 5/12.   Iron deficiency anemia 10/01/2017   Joint pain    Mixed diabetic hyperlipidemia associated with type 2 diabetes mellitus (HCC) 10/01/2017   On home oxygen  therapy    5L at night (10/05/2017)   OSA on CPAP    Osteoarthritis    back (10/05/2017)   Oxygen  dependent 10/16/2011   Pneumonia    lots of times (10/05/2017)   PONV (postoperative nausea and vomiting)    Poorly controlled diabetes mellitus (HCC) 11/12/2017   Pulmonary infiltrates 12/22/2011   Followed in Pulmonary clinic/ [facility]/ [place]    - See CT Chest 05/05/11    Shortness of breath    Tachycardia    never had test done since no insurance   Tobacco use disorder-current smoker greater than 40-pack-year history- since age 59 1 ppd 10/01/2017   Type II diabetes mellitus (HCC)    Vitamin D  deficiency     Past Surgical History:  Procedure Laterality Date   cataract surgery Left 03/18/2023   CESAREAN SECTION  1988; 1989   CHOLECYSTECTOMY OPEN  1990   COLONOSCOPY  09/16/2010   DOQ:wnmfjo colon/small internal hemorrhoids   ESOPHAGOGASTRODUODENOSCOPY  09/16/2010   SLF: normal/mild gastritis   ESOPHAGOGASTRODUODENOSCOPY N/A 10/19/2014   Procedure: ESOPHAGOGASTRODUODENOSCOPY (EGD);  Surgeon: Margo LITTIE Haddock, MD;  Location: [facility]  ENDO SUITE;  Service: Endoscopy;  Laterality: N/A;  830   FRACTURE SURGERY     HYSTEROSCOPY WITH THERMACHOICE  01/17/2012   Procedure: HYSTEROSCOPY WITH THERMACHOICE;  Surgeon: Vonn VEAR Inch, MD;  Location: [facility] ORS;  Service: Gynecology;  Laterality: N/A;  total therapy time: 9:13sec  D5W  18 ml in, D5W   18ml out, temperture 87degrees celcious   KIDNEY SURGERY     as child for blockages   LEFT HEART CATH AND CORONARY ANGIOGRAPHY N/A 09/08/2019   Procedure: LEFT HEART CATH AND CORONARY ANGIOGRAPHY;  Surgeon: Jordan, Peter M, MD;  Location: [facility] INVASIVE CV LAB;  Service: Cardiovascular;  Laterality: N/A;  TONSILLECTOMY     TUBAL LIGATION  1989   TYMPANOSTOMY TUBE PLACEMENT Bilateral    several times when I was a child   uterine ablation     WRIST FRACTURE SURGERY Left 1995    Family Psychiatric History: See below  Family History:  Family History  Problem Relation Age of Onset   Heart attack Mother 34       deceased   Diabetes Mother    Breast cancer Mother 50       inflammatory breast ca   Heart failure Mother        oxygen  dependence, nonsmoker   Heart disease Mother    Depression Mother    Cancer Mother        Breast   Hypertension Mother    Hyperlipidemia Mother    Sudden death Mother    Sleep apnea Mother    Obesity Mother    Heart attack Father 71       deceased, etoh use   Heart disease Father    Alcohol abuse Father    Depression Father    Heart failure Father    Sudden death Father    Ulcers Sister    Hypertension Sister    Heart failure Sister    Depression Sister    Anxiety disorder Sister    Liver disease Maternal Aunt 40       died while on liver transplant list   Liver disease Maternal Uncle    Cancer Paternal Aunt        Breast   Heart disease Maternal Grandmother    Heart attack Maternal Grandmother        premature CAD   Colon cancer Neg Hx     Social History:  Social History   Socioeconomic History   Marital status: Married    Spouse  name: Not on file   Number of children: 2   Years of education: Not on file   Highest education level: Not on file  Occupational History   Occupation: unemployed  Tobacco Use   Smoking status: Every Day    Current packs/day: 1.50    Average packs/day: 1.5 packs/day for 45.0 years (67.5 ttl pk-yrs)    Types: Cigarettes, E-cigarettes    Start date: 1980   Smokeless tobacco: Current   Tobacco comments:    vape  Vaping Use   Vaping status: Every Day  Substance and Sexual Activity   Alcohol use: Not Currently   Drug use: Yes    Types: Marijuana    Comment: occ   Sexual activity: Not Currently    Partners: Male    Birth control/protection: Surgical    Comment: BTL, younger than 16, more than 5  Other Topics Concern   Not on file  Social History Narrative   Has 2 step children.   Lives in multi-family home with children and grandchildren.   On disability.   Social Drivers of Corporate Investment Banker Strain: Low Risk  (02/09/2023)   Overall Financial Resource Strain (CARDIA)    Difficulty of Paying Living Expenses: Not hard at all  Food Insecurity: No Food Insecurity (02/09/2023)   Hunger Vital Sign    Worried About Running Out of Food in the Last Year: Never true    Ran Out of Food in the Last Year: Never true  Transportation Needs: No Transportation Needs (02/09/2023)   PRAPARE - Administrator, Civil Service (Medical): No    Lack of Transportation (Non-Medical):  No  Physical Activity: Inactive (02/09/2023)   Exercise Vital Sign    Days of Exercise per Week: 0 days    Minutes of Exercise per Session: 0 min  Stress: No Stress Concern Present (02/09/2023)   [employer] of Occupational Health - Occupational Stress Questionnaire    Feeling of Stress : Only a little  Social Connections: Moderately Integrated (02/09/2023)   Social Connection and Isolation Panel [NHANES]    Frequency of Communication with Friends and Family: More than three times a week     Frequency of Social Gatherings with Friends and Family: Not on file    Attends Religious Services: More than 4 times per year    Active Member of [employer] or Organizations: No    Attends Banker Meetings: Never    Marital Status: Married    Allergies:  Allergies  Allergen Reactions   Codeine  Itching and Nausea Only   Metformin  And Related Diarrhea, Nausea Only and Other (See Comments)    Stomach pain/nausea   Wellbutrin  [Bupropion  Hcl] Hives   Acyclovir And Related Rash    Metabolic Disorder Labs: Lab Results  Component Value Date   HGBA1C 6.5 (H) 03/30/2023   MPG 177.16 06/02/2021   MPG 188.64 03/05/2019   No results found for: PROLACTIN Lab Results  Component Value Date   CHOL 119 12/14/2021   TRIG 86.0 12/14/2021   HDL 44.10 12/14/2021   CHOLHDL 3 12/14/2021   VLDL 17.2 12/14/2021   LDLCALC 57 12/14/2021   LDLCALC 63 03/14/2021   Lab Results  Component Value Date   TSH 1.860 06/30/2019   TSH 2.40 11/26/2018    Therapeutic Level Labs: No results found for: LITHIUM No results found for: VALPROATE No results found for: CBMZ  Current Medications: Current Outpatient Medications  Medication Sig Dispense Refill   ACCU-CHEK GUIDE test strip USE UP TO FOUR TIMES DAILY AS DIRECTED 200 strip 11   Accu-Chek Softclix Lancets lancets USE UP TO FOUR TIMES DAILY AS DIRECTED 300 each 0   albuterol  (PROVENTIL ) (2.5 MG/3ML) 0.083% nebulizer solution Take 3 mLs (2.5 mg total) by nebulization every 6 (six) hours as needed for wheezing or shortness of breath. 75 mL 0   albuterol  (VENTOLIN  HFA) 108 (90 Base) MCG/ACT inhaler Inhale 2 puffs into the lungs every 6 (six) hours as needed for wheezing or shortness of breath. 8 g 6   amphetamine -dextroamphetamine  (ADDERALL) 30 MG tablet Take 1 tablet by mouth 2 (two) times daily. 60 tablet 0   amphetamine -dextroamphetamine  (ADDERALL) 30 MG tablet Take 1 tablet by mouth 2 (two) times daily. 60 tablet 0    amphetamine -dextroamphetamine  (ADDERALL) 30 MG tablet Take 1 tablet by mouth 2 (two) times daily. 60 tablet 0   ARIPiprazole  (ABILIFY ) 5 MG tablet Take 1 tablet (5 mg total) by mouth daily. 90 tablet 1   atorvastatin  (LIPITOR) 40 MG tablet TAKE 0.5 TABLETS (20 MG TOTAL) BY MOUTH 2 (TWO) TIMES DAILY. 90 tablet 3   bismuth subsalicylate (PEPTO BISMOL) 262 MG/15ML suspension Take 30 mLs by mouth every 6 (six) hours as needed for indigestion.     blood glucose meter kit and supplies Dispense based on patient and insurance preference. Use up to four times daily as directed. (FOR ICD-10 E10.9, E11.9). 1 each 0   clonazePAM  (KLONOPIN ) 0.5 MG tablet Take 1 tablet (0.5 mg total) by mouth 2 (two) times daily as needed for anxiety. 60 tablet 2   Dextromethorphan -guaiFENesin  (MUCINEX  DM) 30-600 MG TB12 Take 1 tablet by  mouth every 12 (twelve) hours as needed (for coughing or to loosen mucus).     DULoxetine  (CYMBALTA ) 60 MG capsule Take 1 capsule (60 mg total) by mouth in the morning. 90 capsule 2   fluticasone  (FLONASE ) 50 MCG/ACT nasal spray PLACE 1 SPRAY INTO BOTH NOSTRILS 2 (TWO) TIMES DAILY AS NEEDED FOR ALLERGIES OR RHINITIS (AFTER SINUS RINSE). (Patient taking differently: Place 1 spray into both nostrils in the morning and at bedtime.) 48 mL 2   glipiZIDE  (GLUCOTROL ) 5 MG tablet Take 1 tablet (5 mg total) by mouth daily before breakfast.     ibuprofen  (ADVIL ) 200 MG tablet Take 600 mg by mouth every 6 (six) hours as needed for mild pain or headache.     insulin  glargine (LANTUS ) 100 UNIT/ML injection Inject 0.2 mLs (20 Units total) into the skin at bedtime. 10 mL 6   Insulin  Pen Needle (BD PEN NEEDLE NANO 2ND GEN) 32G X 4 MM MISC USE DAILY WITH VICTOZA  100 each 11   Insulin  Syringe-Needle U-100 (INSULIN  SYRINGE .5CC/31GX5/16) 31G X 5/16 0.5 ML MISC Inject 1 Units into the muscle daily. use as directed 100 each 2   ipratropium-albuterol  (DUONEB) 0.5-2.5 (3) MG/3ML SOLN Take 3 mLs by nebulization 3 (three)  times daily. 360 mL 3   losartan  (COZAAR ) 25 MG tablet TAKE 1 TABLET DAILY 90 tablet 3   meloxicam  (MOBIC ) 15 MG tablet TAKE 1 TABLET (15 MG TOTAL) BY MOUTH DAILY. 90 tablet 3   pantoprazole  (PROTONIX ) 40 MG tablet TAKE 1 TABLET BY MOUTH TWICE A DAY 180 tablet 1   pregabalin  (LYRICA ) 150 MG capsule TAKE 1 CAPSULE BY MOUTH TWICE A DAY 180 capsule 3   Semaglutide , 2 MG/DOSE, (OZEMPIC , 2 MG/DOSE,) 8 MG/3ML SOPN INJECT 2 MG AS DIRECTED ONCE A WEEK. 8 mL 6   umeclidinium-vilanterol (ANORO ELLIPTA ) 62.5-25 MCG/ACT AEPB Inhale 1 puff into the lungs daily. 30 each 11   No current facility-administered medications for this visit.     Musculoskeletal: Strength & Muscle Tone: within normal limits Gait & Station: normal Patient leans: N/A  Psychiatric Specialty Exam: Review of Systems  All other systems reviewed and are negative.   Last menstrual period 07/24/2014.There is no height or weight on file to calculate BMI.  General Appearance: Casual and Fairly Groomed  Eye Contact:  Good  Speech:  Clear and Coherent  Volume:  Normal  Mood:  Euthymic  Affect:  Congruent  Thought Process:  Goal Directed  Orientation:  Full (Time, Place, and Person)  Thought Content: Rumination   Suicidal Thoughts:  No  Homicidal Thoughts:  No  Memory:  Immediate;   Good Recent;   Good Remote;   Good  Judgement:  Good  Insight:  Good  Psychomotor Activity:  Normal  Concentration:  Concentration: Good and Attention Span: Good  Recall:  Good  Fund of Knowledge: Good  Language: Good  Akathisia:  No  Handed:  Right  AIMS (if indicated): not done  Assets:  Communication Skills Desire for Improvement Resilience Social Support  ADL's:  Intact  Cognition: WNL  Sleep:  Good   Screenings: AIMS    Flowsheet Row Admission (Discharged) from 03/07/2019 in [facility]  AIMS Total Score 0 0       AUDIT    Flowsheet Row Admission (Discharged) from 03/07/2019 in [facility] Admission (Discharged[facility]  Alcohol  Use Disorder Identification Test Final Score (AUDIT) 0 0      GAD-7    Flowsheet Row Office Visit from 03/20/2022 in [facility] [place] [facility] Visit from 03/14/2021 in [facility] [place] [facility] Visit from 10/20/2020 in [facility] [place] [facility] Visit from 11/06/2019 in [facility]  Total GAD-7 Score 8 6 9 5       PHQ2-9    Flowsheet Row Clinical Support from 02/09/2023 in [facility] [place] [facility] Visit from 12/20/2022 in [facility] [place] [facility] [employer] Visit from 09/19/2022 in [facility] [employer] Visit from 03/20/2022 in [facility] [place] [facility] [employer] Video Visit from 03/03/2022 in [facility] Total Score 0 0 0 3 1  PHQ-9 Total Score 2 1 2 9  --      Flowsheet Row ED to Hosp-Admission (Discharged) from 06/09/2022 in [facility] Video Visit from 03/03/2022 in [facility] at [place] Video Visit from 12/01/2021 in [facility] at [place]  C-SSRS RISK CATEGORY No Risk No Risk No Risk        Assessment and Plan: This patient is a 58 year old female with a history of depression anxiety and ADD.  Despite the recent stressors she is doing well on her current regimen.  She will continue Cymbalta  60 mg daily for depression, clonazepam  0.5 mg twice daily for anxiety, Abilify  5 mg daily for antidepressant augmentation and Adderall 30 mg twice daily for ADD.  She will return to see me in 3  months  Collaboration of Care: Collaboration of Care: Primary Care Provider AEB notes are shared with PCP on the epic system  Patient/Guardian was advised Release of Information must be obtained prior to any record release in order to collaborate their care with an outside provider. Patient/Guardian was advised if they have not already done so to contact the registration department to sign all necessary forms in order for us  to release information regarding their care.   Consent: Patient/Guardian gives verbal consent for treatment and assignment of benefits for services provided during this visit. Patient/Guardian expressed understanding and agreed to proceed.    Barnie Gull, MD 05/04/2023, 10:16 AM

## 2023-05-16 ENCOUNTER — Other Ambulatory Visit: Payer: Self-pay | Admitting: Family Medicine

## 2023-05-23 ENCOUNTER — Other Ambulatory Visit: Payer: Self-pay | Admitting: Family Medicine

## 2023-06-15 ENCOUNTER — Other Ambulatory Visit: Payer: Self-pay

## 2023-06-15 ENCOUNTER — Ambulatory Visit
Admission: RE | Admit: 2023-06-15 | Discharge: 2023-06-15 | Disposition: A | Discharge status: Home / Self Care | Payer: Medicare HMO | Source: Ambulatory Visit | Attending: Physician Assistant | Admitting: Physician Assistant

## 2023-06-15 ENCOUNTER — Ambulatory Visit (INDEPENDENT_AMBULATORY_CARE_PROVIDER_SITE_OTHER): Payer: Medicare HMO

## 2023-06-15 VITALS — BP 120/87 | HR 97 | Temp 98.7°F | Resp 18

## 2023-06-15 DIAGNOSIS — M25531 Pain in right wrist: Secondary | ICD-10-CM

## 2023-06-15 DIAGNOSIS — L03113 Cellulitis of right upper limb: Secondary | ICD-10-CM | POA: Diagnosis not present

## 2023-06-15 DIAGNOSIS — M79631 Pain in right forearm: Secondary | ICD-10-CM | POA: Diagnosis not present

## 2023-06-15 MED ORDER — NAPROXEN 375 MG PO TABS: 375.0000 mg | ORAL_TABLET | Freq: Two times a day (BID) | ORAL | 0 refills | Status: DC

## 2023-06-15 MED ORDER — CEPHALEXIN 500 MG PO CAPS
500.0000 mg | ORAL_CAPSULE | Freq: Three times a day (TID) | ORAL | 0 refills | Status: AC
Start: 2023-06-15 — End: ?

## 2023-06-15 MED ORDER — SULFAMETHOXAZOLE-TRIMETHOPRIM 800-160 MG PO TABS: 1.0000 | ORAL_TABLET | Freq: Two times a day (BID) | ORAL | 0 refills | Status: AC

## 2023-06-15 NOTE — Discharge Instructions (Signed)
I will contact you if the radiologist sees something on your x-ray that I did not and we need to change our treatment plan.  Take Naprosyn twice daily for pain relief.  Do not take additional NSAIDs with this medication including aspirin, ibuprofen/Advil, naproxen/Aleve.  You can use acetaminophen/Tylenol.  You are going to start antibiotics to cover for infection.  Start cephalexin 3 times daily for 1 week and Bactrim DS twice daily for 1 week.  If you develop any rash or oral lesions stop the medication to be seen immediately.  Draw a line around the area of redness and if this continues to spread you need to go to the emergency room.  If your symptoms significantly improved with the medication regimen please follow-up with your primary care next week.  If you are not feeling significantly better within the next few days please return here so we can reevaluate you.  If anything worsens and you have spread of redness, fever, numbness or tingling in your fingers, weakness you need to be seen immediately in the emergency room.

## 2023-06-15 NOTE — ED Provider Notes (Signed)
[facility]    CSN: [id number] Arrival date & time: 06/15/23  1652      History   Chief Complaint Chief Complaint  Patient presents with   Hand Problem   Hand Pain    HPI [name] is a 58 y.o. female.   Patient presents today with 36-hour history of right ulnar wrist pain.  She denies any known injury increase in activity prior to symptom onset.  She reports that she woke up 1 morning and had redness and pain that has slightly worsened since onset.  Pain is rated 7 on a 0-10 pain scale, described as throbbing with periodic sharp pains with palpation or movement, no alleviating factors identified.  She is right-handed.  Denies any previous injury or surgery involving her wrist.  She does report some numbness in all of her fingers but denies any significant paresthesias.  She has been taking ibuprofen without improvement of symptoms.  She denies any fever, nausea, vomiting.  She denies any history of septic arthritis.  She is having difficulty with daily activities as a result of symptoms.  Denies any bites or exposure to insects.  She denies history of gout or rheumatoid arthritis.    Past Medical History:  Diagnosis Date   ADHD    Alcohol abuse    Anemia    Anxiety    Asthmatic bronchitis    normal PFT/ seen by pulmonary no evidence of COPD   Atypical chest pain 10/05/2017   Back pain    Bilateral swelling of feet    Chest pain    Chest pain on respiration 03/25/2014   Chronic bronchitis (HCC)    Chronic lower back pain    Chronic respiratory failure (HCC) 10/16/2011   Newly 02 dep 24/7 p discharge from [facility] 01/2013  - 03/13/2014  Walked RA  2 laps @ 185 ft each stopped due to  Sob/ aching in legs, thirsty/ no desat @ slow pace    Chronic respiratory failure with hypoxia (HCC)    On 2-3 L of oxygen at home   Cigarette smoker 12/02/2010   Followed in Pulmonary clinic/ [facility]/ [place]   - Limits of effective care reviewed 12/22/2011      Complication of anesthesia    Constipation    COPD (chronic obstructive pulmonary disease) (HCC)    COPD with chronic bronchitis (HCC) 10/01/2017   Daily headache    Depression    Diabetic peripheral neuropathy (HCC)    DM (diabetes mellitus) type II controlled, neurological manifestation (HCC) 12/02/2010   Drug use    Gallbladder problem    Gastric erosions    EGD 08/2010.   GERD (gastroesophageal reflux disease)    Glaucoma    H/O drug abuse (HCC) 11/12/2017   -- scanned document from outside source: [facility] in [place] which showed UDS positive for Adderall /amphetamine usage as well as positive urine for THC.  This test result was collected 08/11/2016 and reported 10/07/2016. - lso review of the chart shows another positive amphetamine, THC and METH in the urine back on 10/18/2015 under "care everywhere". ---So    Heavy menses    High cholesterol    History of blood transfusion    "related to low HgB" (10/05/2017)   History of hiatal hernia    HTN (hypertension)    Hyperlipidemia associated with type 2 diabetes mellitus (HCC) 10/18/2015   Hypertension associated with diabetes (HCC) 12/02/2010   D/c acei  12/22/2011 due to psuedowheeze and narcotic dependent cough> ? Improved - 03/14/2014 started bystolic in place of cozar due to cough     IBS (irritable bowel syndrome)    Increased urinary protein excretion    Internal hemorrhoids    Colonoscopy 5/12.   Iron deficiency anemia 10/01/2017   Joint pain    Mixed diabetic hyperlipidemia associated with type 2 diabetes mellitus (HCC) 10/01/2017   On home oxygen therapy    "5L at night" (10/05/2017)   OSA on CPAP    Osteoarthritis    "back" (10/05/2017)   Oxygen dependent 10/16/2011   Pneumonia    "lots of times" (10/05/2017)   PONV (postoperative nausea and vomiting)    Poorly controlled diabetes mellitus (HCC) 11/12/2017   Pulmonary infiltrates 12/22/2011   Followed in Pulmonary clinic/ [facility]/ [place]    - See CT Chest 05/05/11    Shortness of breath    Tachycardia    never had test done since no insurance   Tobacco use disorder-current smoker greater than 40-pack-year history- since age 43 1 ppd 10/01/2017   Type II diabetes mellitus (HCC)    Vitamin D deficiency     Patient Active Problem List   Diagnosis Date Noted   Coronary atherosclerosis 05/23/2022   Aortic atherosclerosis (HCC) 05/23/2022   Arthritis of left knee 12/14/2021   Acute respiratory failure with hypoxia (HCC) 06/02/2021   Marijuana use 03/14/2021   Allergic rhinitis 03/14/2021   Angina pectoris (HCC) 09/08/2019   Benzodiazepine overdose 03/05/2019   Generalized muscle ache 09/24/2018   Chronic right ear pain 03/11/2018   Chronic sinusitis 03/11/2018   H/O drug abuse (HCC) 11/12/2017   Atypical chest pain 10/05/2017   COPD with chronic bronchitis (HCC) 10/01/2017   Mixed hyperlipidemia 10/01/2017   Tobacco use disorder 10/01/2017   Diabetic peripheral neuropathy (HCC) 10/01/2017   Iron deficiency anemia 10/01/2017   Adult attention deficit hyperactivity disorder 01/14/2016   Major depressive disorder, recurrent severe without psychotic features (HCC)    Leukocytosis 03/25/2014   OSA on CPAP 04/11/2013   Perforated ear drum, right 05/22/2012   Bell's palsy 01/23/2012   Carpal tunnel syndrome on both sides 01/11/2012   Vitamin D deficiency 07/06/2011   Asthmatic bronchitis 06/11/2011   Back pain 06/11/2011   Morbid obesity with BMI of 45.0-49.9, adult (HCC) 05/06/2011   Gastric erosions 12/05/2010   Essential hypertension 12/02/2010   Type 2 diabetes mellitus with polyneuropathy (HCC) 12/02/2010   COPD with acute exacerbation (HCC) 11/30/2010   GERD (gastroesophageal reflux disease) 09/08/2010   Esophageal dysphagia 09/08/2010    Past Surgical History:  Procedure Laterality Date   cataract surgery Left 03/18/2023   CESAREAN SECTION  1988; 1989   CHOLECYSTECTOMY OPEN  1990    COLONOSCOPY  09/16/2010   ZOX:WRUEAV colon/small internal hemorrhoids   ESOPHAGOGASTRODUODENOSCOPY  09/16/2010   SLF: normal/mild gastritis   ESOPHAGOGASTRODUODENOSCOPY N/A 10/19/2014   Procedure: ESOPHAGOGASTRODUODENOSCOPY (EGD);  Surgeon: West Bali, MD;  Location: [facility];  Service: Endoscopy;  Laterality: N/A;  830   FRACTURE SURGERY     HYSTEROSCOPY WITH THERMACHOICE  01/17/2012   Procedure: HYSTEROSCOPY WITH THERMACHOICE;  Surgeon: Lazaro Arms, MD;  Location: [facility] ORS;  Service: Gynecology;  Laterality: N/A;  total therapy time: 9:13sec  D5W  18 ml in, D5W   18ml out, temperture 87degrees celcious   KIDNEY SURGERY     as child for blockages   LEFT HEART CATH AND CORONARY ANGIOGRAPHY N/A 09/08/2019  Procedure: LEFT HEART CATH AND CORONARY ANGIOGRAPHY;  Surgeon: [place], Peter M, MD;  Location: [facility];  Service: Cardiovascular;  Laterality: N/A;   TONSILLECTOMY     TUBAL LIGATION  1989   TYMPANOSTOMY TUBE PLACEMENT Bilateral    "several times when I was a child"   uterine ablation     WRIST FRACTURE SURGERY Left 1995    OB History     Gravida  2   Para  2   Term  2   Preterm      AB      Living  2      SAB      IAB      Ectopic      Multiple      Live Births               Home Medications    Prior to Admission medications   Medication Sig Start Date End Date Taking? Authorizing Provider  amphetamine-dextroamphetamine (ADDERALL) 30 MG tablet Take 1 tablet by mouth 2 (two) times daily. 05/04/23 05/03/24 Yes Myrlene Broker, MD  ARIPiprazole (ABILIFY) 5 MG tablet Take 1 tablet (5 mg total) by mouth daily. 05/04/23 05/03/24 Yes Myrlene Broker, MD  atorvastatin (LIPITOR) 40 MG tablet TAKE 0.5 TABLETS (20 MG TOTAL) BY MOUTH 2 (TWO) TIMES DAILY. 09/08/22  Yes Loyola Mast, MD  bismuth subsalicylate (PEPTO BISMOL) 262 MG/15ML suspension Take 30 mLs by mouth every 6 (six) hours as needed for indigestion.   Yes [provider]   cephALEXin (KEFLEX) 500 MG capsule Take 1 capsule (500 mg total) by mouth 3 (three) times daily. 06/15/23  Yes Ferron Ishmael K, PA-C  clonazePAM (KLONOPIN) 0.5 MG tablet Take 1 tablet (0.5 mg total) by mouth 2 (two) times daily as needed for anxiety. 05/04/23  Yes Myrlene Broker, MD  DULoxetine (CYMBALTA) 60 MG capsule Take 1 capsule (60 mg total) by mouth in the morning. 05/04/23  Yes Myrlene Broker, MD  fluticasone (FLONASE) 50 MCG/ACT nasal spray PLACE 1 SPRAY INTO BOTH NOSTRILS 2 (TWO) TIMES DAILY AS NEEDED FOR ALLERGIES OR RHINITIS (AFTER SINUS RINSE). Patient taking differently: Place 1 spray into both nostrils in the morning and at bedtime. 01/26/22  Yes Loyola Mast, MD  ibuprofen (ADVIL) 200 MG tablet Take 600 mg by mouth every 6 (six) hours as needed for mild pain or headache.   Yes [provider]  insulin glargine (LANTUS) 100 UNIT/ML injection Inject 0.2 mLs (20 Units total) into the skin at bedtime. 10/30/22  Yes Loyola Mast, MD  losartan (COZAAR) 25 MG tablet TAKE 1 TABLET DAILY 09/08/22  Yes Loyola Mast, MD  naproxen (NAPROSYN) 375 MG tablet Take 1 tablet (375 mg total) by mouth 2 (two) times daily. 06/15/23  Yes Khair Chasteen K, PA-C  pantoprazole (PROTONIX) 40 MG tablet TAKE 1 TABLET BY MOUTH TWICE A DAY 05/23/23  Yes Loyola Mast, MD  pregabalin (LYRICA) 150 MG capsule TAKE 1 CAPSULE BY MOUTH TWICE A DAY 12/11/22  Yes Loyola Mast, MD  Semaglutide, 2 MG/DOSE, (OZEMPIC, 2 MG/DOSE,) 8 MG/3ML SOPN INJECT 2 MG AS DIRECTED ONCE A WEEK. 09/27/22  Yes Loyola Mast, MD  sulfamethoxazole-trimethoprim (BACTRIM DS) 800-160 MG tablet Take 1 tablet by mouth 2 (two) times daily for 7 days. 06/15/23 06/22/23 Yes Jaquelyn Sakamoto, Noberto Retort, PA-C  ACCU-CHEK GUIDE test strip USE UP TO FOUR TIMES DAILY AS DIRECTED 08/18/19   Overton Mam, DO  Accu-Chek Softclix Lancets lancets USE UP TO FOUR TIMES DAILY AS DIRECTED 06/20/19   Cirigliano, Mary K, DO  albuterol (PROVENTIL) (2.5 MG/3ML) 0.083%  nebulizer solution Take 3 mLs (2.5 mg total) by nebulization every 6 (six) hours as needed for wheezing or shortness of breath. 06/13/22 03/30/23  Darlin Drop, DO  albuterol (VENTOLIN HFA) 108 (90 Base) MCG/ACT inhaler Inhale 2 puffs into the lungs every 6 (six) hours as needed for wheezing or shortness of breath. 06/20/22   Loyola Mast, MD  amphetamine-dextroamphetamine (ADDERALL) 30 MG tablet Take 1 tablet by mouth 2 (two) times daily. 05/04/23 05/03/24  Myrlene Broker, MD  amphetamine-dextroamphetamine (ADDERALL) 30 MG tablet Take 1 tablet by mouth 2 (two) times daily. 05/04/23 05/03/24  Myrlene Broker, MD  blood glucose meter kit and supplies Dispense based on patient and insurance preference. Use up to four times daily as directed. (FOR ICD-10 E10.9, E11.9). 06/20/19   Cirigliano, Jearld Lesch, DO  Insulin Pen Needle (BD PEN NEEDLE NANO 2ND GEN) 32G X 4 MM MISC USE DAILY WITH VICTOZA 09/20/21   Loyola Mast, MD  Insulin Syringe-Needle U-100 (INSULIN SYRINGE .5CC/31GX5/16") 31G X 5/16" 0.5 ML MISC Inject 1 Units into the muscle daily. use as directed 04/02/23   Loyola Mast, MD  ipratropium-albuterol (DUONEB) 0.5-2.5 (3) MG/3ML SOLN Take 3 mLs by nebulization 3 (three) times daily. 06/20/22   Loyola Mast, MD  umeclidinium-vilanterol [facility] ELLIPTA) 62.5-25 MCG/ACT AEPB Inhale 1 puff into the lungs daily. 06/29/22   Loyola Mast, MD    Family History Family History  Problem Relation Age of Onset   Heart attack Mother 56       deceased   Diabetes Mother    Breast cancer Mother 43       inflammatory breast ca   Heart failure Mother        oxygen dependence, nonsmoker   Heart disease Mother    Depression Mother    Cancer Mother        Breast   Hypertension Mother    Hyperlipidemia Mother    Sudden death Mother    Sleep apnea Mother    Obesity Mother    Heart attack Father 33       deceased, etoh use   Heart disease Father    Alcohol abuse Father    Depression Father    Heart  failure Father    Sudden death Father    Ulcers Sister    Hypertension Sister    Heart failure Sister    Depression Sister    Anxiety disorder Sister    Liver disease Maternal Aunt 40       died while on liver transplant list   Liver disease Maternal Uncle    Cancer Paternal Aunt        Breast   Heart disease Maternal Grandmother    Heart attack Maternal Grandmother        premature CAD   Colon cancer Neg Hx     Social History Social History   Tobacco Use   Smoking status: Every Day    Current packs/day: 1.50    Average packs/day: 1.5 packs/day for 45.1 years (67.7 ttl pk-yrs)    Types: Cigarettes    Start date: 1980   Smokeless tobacco: Never   Tobacco comments:    vape  Vaping Use   Vaping status: Former  Substance Use Topics   Alcohol use: Not Currently   Drug use: Yes  Types: Marijuana    Comment: occ     Allergies   Codeine, Metformin and related, Wellbutrin [bupropion hcl], and Acyclovir and related   Review of Systems Review of Systems  Constitutional:  Positive for activity change. Negative for appetite change, fatigue and fever.  Respiratory:  Negative for shortness of breath.   Cardiovascular:  Negative for chest pain.  Gastrointestinal:  Negative for abdominal pain, diarrhea, nausea and vomiting.  Musculoskeletal:  Positive for arthralgias. Negative for myalgias.  Skin:  Positive for color change. Negative for wound.  Neurological:  Positive for numbness (Right fingers). Negative for weakness.     Physical Exam Triage Vital Signs ED Triage Vitals  Encounter Vitals Group     BP 06/15/23 1725 120/87     Systolic BP Percentile --      Diastolic BP Percentile --      Pulse Rate 06/15/23 1725 (!) 102     Resp 06/15/23 1725 18     Temp 06/15/23 1725 98.7 F (37.1 C)     Temp Source 06/15/23 1725 Oral     SpO2 06/15/23 1725 94 %     Weight --      Height --      Head Circumference --      Peak Flow --      Pain Score 06/15/23 1720 7      Pain Loc --      Pain Education --      Exclude from Growth Chart --    No data found.  Updated Vital Signs BP 120/87 (BP Location: Left Arm)   Pulse 97   Temp 98.7 F (37.1 C) (Oral)   Resp 18   LMP 07/24/2014 Comment: pt has had ablation, but still has some periods  SpO2 99%   Visual Acuity Right Eye Distance:   Left Eye Distance:   Bilateral Distance:    Right Eye Near:   Left Eye Near:    Bilateral Near:     Physical Exam Vitals reviewed.  Constitutional:      General: She is awake. She is not in acute distress.    Appearance: Normal appearance. She is well-developed. She is not ill-appearing.     Comments: Very pleasant female appears stated age in no acute distress sitting comfortably in exam room  HENT:     Head: Normocephalic and atraumatic.  Cardiovascular:     Rate and Rhythm: Normal rate and regular rhythm.     Pulses:          Radial pulses are 2+ on the right side.     Heart sounds: Normal heart sounds, S1 normal and S2 normal. No murmur heard.    Comments: Capillary refill within 2 seconds right fingers Pulmonary:     Effort: Pulmonary effort is normal.     Breath sounds: Normal breath sounds. No wheezing, rhonchi or rales.     Comments: Clear to auscultation bilaterally Musculoskeletal:     Right wrist: Swelling and tenderness present. No deformity, bony tenderness or snuff box tenderness. Decreased range of motion.     Right hand: There is no disruption of two-point discrimination. Normal capillary refill. Normal pulse.     Comments: Right wrist/hand: Approximately 4 cm x 3 cm erythematous area over distal volar ulna.  No wound noted.  No bleeding or drainage noted.  Decreased range of motion with flexion of fingers secondary to discomfort.  Normal pincer grip strength.  Hand is neurovascularly intact.  No streaking or  evidence of lymphangitis.  Psychiatric:        Behavior: Behavior is cooperative.      UC Treatments / Results  Labs (all labs  ordered are listed, but only abnormal results are displayed) Labs Reviewed  CBC WITH DIFFERENTIAL/PLATELET  BASIC METABOLIC PANEL  URIC ACID    EKG   Radiology No results found.  Procedures Procedures (including critical care time)  Medications Ordered in UC Medications - No data to display  Initial Impression / Assessment and Plan / UC Course  I have reviewed the triage vital signs and the nursing notes.  Pertinent labs & imaging results that were available during my care of the patient were reviewed by me and considered in my medical decision making (see chart for details).     Patient is well-appearing, afebrile, nontoxic.  She was mildly tachycardic on intake but this improved after sitting quietly for several minutes.  X-ray was obtained given significant pain that did not show any osseous abnormality based on my primary read.  At the time of discharge we were waiting for radiologist over read.  Unclear etiology of symptoms.  Concern for cellulitis given associated erythema, warmth, pain so will cover with cephalexin and Bactrim DS.  CBC, BMP were obtained and are pending.  We will contact her if these are abnormal and change our treatment plan.  We discussed symptoms could also be related to gout though she does not have a history of this and denies any exposure to common triggers.  Was started on Naprosyn for pain relief and we discussed that she should not take NSAIDs with this medication.  Uric acid was obtained and significantly elevated will consider changing NSAIDs for additional pain relief.  She was encouraged to continue using Ace wrap and elevation to help manage her symptoms.  We discussed that she should monitor the area of redness with a marker and if this continues to spread despite treatment she should go to the emergency room for further evaluation and management.  We discussed that she should have a low threshold for going to the ER if anything worsens including  spread of redness, increasing pain, fever, nausea, vomiting, streaking.  If her symptoms are not significantly improved but are not worsening she is to return here in 1 to 2 days for recheck.  Discussed that it is very important she follows up even if she is feeling better but she can do this with her primary care assuming improvement of symptoms.  All questions were answered to patient satisfaction.  Return precautions were given and she expressed understanding and agreement with treatment plan.  Final Clinical Impressions(s) / UC Diagnoses   Final diagnoses:  Right wrist pain  Cellulitis of right wrist     Discharge Instructions      I will contact you if the radiologist sees something on your x-ray that I did not and we need to change our treatment plan.  Take Naprosyn twice daily for pain relief.  Do not take additional NSAIDs with this medication including aspirin, ibuprofen/Advil, naproxen/Aleve.  You can use acetaminophen/Tylenol.  You are going to start antibiotics to cover for infection.  Start cephalexin 3 times daily for 1 week and Bactrim DS twice daily for 1 week.  If you develop any rash or oral lesions stop the medication to be seen immediately.  Draw a line around the area of redness and if this continues to spread you need to go to the emergency room.  If your  symptoms significantly improved with the medication regimen please follow-up with your primary care next week.  If you are not feeling significantly better within the next few days please return here so we can reevaluate you.  If anything worsens and you have spread of redness, fever, numbness or tingling in your fingers, weakness you need to be seen immediately in the emergency room.     ED Prescriptions     Medication Sig Dispense Auth. Provider   sulfamethoxazole-trimethoprim (BACTRIM DS) 800-160 MG tablet Take 1 tablet by mouth 2 (two) times daily for 7 days. 14 tablet Anner Baity K, PA-C   cephALEXin (KEFLEX) 500 MG  capsule Take 1 capsule (500 mg total) by mouth 3 (three) times daily. 21 capsule Avalina Benko K, PA-C   naproxen (NAPROSYN) 375 MG tablet Take 1 tablet (375 mg total) by mouth 2 (two) times daily. 20 tablet Alicianna Litchford, Noberto Retort, PA-C      PDMP not reviewed this encounter.   [name], PA-C 06/15/23 1825

## 2023-06-15 NOTE — ED Triage Notes (Signed)
Need an X-ray on hand and wrist - Entered by patient  Pt reports she woke up with pain in right hand. States "I don't know if I broke it in my sleep". She is wearing ace wrap from home.

## 2023-06-16 LAB — CBC WITH DIFFERENTIAL/PLATELET
Basophils Absolute: 0.1 10*3/uL (ref 0.0–0.2)
Basos: 0 %
EOS (ABSOLUTE): 0.1 10*3/uL (ref 0.0–0.4)
Eos: 0 %
Hematocrit: 43.7 % (ref 34.0–46.6)
Hemoglobin: 13.9 g/dL (ref 11.1–15.9)
Immature Grans (Abs): 0 10*3/uL (ref 0.0–0.1)
Immature Granulocytes: 0 %
Lymphocytes Absolute: 3.1 10*3/uL (ref 0.7–3.1)
Lymphs: 18 %
MCH: 25.6 pg — ABNORMAL LOW (ref 26.6–33.0)
MCHC: 31.8 g/dL (ref 31.5–35.7)
MCV: 81 fL (ref 79–97)
Monocytes Absolute: 1.2 10*3/uL — ABNORMAL HIGH (ref 0.1–0.9)
Monocytes: 7 %
Neutrophils Absolute: 12.6 10*3/uL — ABNORMAL HIGH (ref 1.4–7.0)
Neutrophils: 75 %
Platelets: 280 10*3/uL (ref 150–450)
RBC: 5.43 x10E6/uL — ABNORMAL HIGH (ref 3.77–5.28)
RDW: 16 % — ABNORMAL HIGH (ref 11.7–15.4)
WBC: 17 10*3/uL — ABNORMAL HIGH (ref 3.4–10.8)

## 2023-06-16 LAB — BASIC METABOLIC PANEL
BUN/Creatinine Ratio: 13 (ref 9–23)
BUN: 10 mg/dL (ref 6–24)
CO2: 26 mmol/L (ref 20–29)
Calcium: 9.4 mg/dL (ref 8.7–10.2)
Chloride: 95 mmol/L — ABNORMAL LOW (ref 96–106)
Creatinine, Ser: 0.78 mg/dL (ref 0.57–1.00)
Glucose: 159 mg/dL — ABNORMAL HIGH (ref 70–99)
Potassium: 4.2 mmol/L (ref 3.5–5.2)
Sodium: 137 mmol/L (ref 134–144)
eGFR: 89 mL/min/{1.73_m2} (ref >59)

## 2023-06-16 LAB — URIC ACID: Uric Acid: 4.8 mg/dL (ref 3.0–7.2)

## 2023-06-29 ENCOUNTER — Ambulatory Visit: Payer: Medicare HMO | Admitting: Family Medicine

## 2023-07-06 ENCOUNTER — Encounter: Payer: Self-pay | Admitting: Family Medicine

## 2023-07-09 ENCOUNTER — Encounter: Payer: Self-pay | Admitting: Family Medicine

## 2023-07-09 ENCOUNTER — Ambulatory Visit (INDEPENDENT_AMBULATORY_CARE_PROVIDER_SITE_OTHER): Admitting: Family Medicine

## 2023-07-09 VITALS — BP 110/62 | HR 93 | Temp 97.0°F | Ht 64.0 in | Wt 248.0 lb

## 2023-07-09 DIAGNOSIS — R1084 Generalized abdominal pain: Secondary | ICD-10-CM | POA: Diagnosis not present

## 2023-07-09 DIAGNOSIS — F332 Major depressive disorder, recurrent severe without psychotic features: Secondary | ICD-10-CM

## 2023-07-09 DIAGNOSIS — I10 Essential (primary) hypertension: Secondary | ICD-10-CM | POA: Diagnosis not present

## 2023-07-09 DIAGNOSIS — E1142 Type 2 diabetes mellitus with diabetic polyneuropathy: Secondary | ICD-10-CM | POA: Diagnosis not present

## 2023-07-09 DIAGNOSIS — Z7985 Long-term (current) use of injectable non-insulin antidiabetic drugs: Secondary | ICD-10-CM

## 2023-07-09 DIAGNOSIS — E782 Mixed hyperlipidemia: Secondary | ICD-10-CM

## 2023-07-09 DIAGNOSIS — F4321 Adjustment disorder with depressed mood: Secondary | ICD-10-CM

## 2023-07-09 DIAGNOSIS — Z794 Long term (current) use of insulin: Secondary | ICD-10-CM

## 2023-07-09 LAB — COMPREHENSIVE METABOLIC PANEL
ALT: 14 U/L (ref 0–35)
AST: 15 U/L (ref 0–37)
Albumin: 4.1 g/dL (ref 3.5–5.2)
Alkaline Phosphatase: 67 U/L (ref 39–117)
BUN: 6 mg/dL (ref 6–23)
CO2: 31 meq/L (ref 19–32)
Calcium: 9.6 mg/dL (ref 8.4–10.5)
Chloride: 95 meq/L — ABNORMAL LOW (ref 96–112)
Creatinine, Ser: 0.79 mg/dL (ref 0.40–1.20)
GFR: 83.08 mL/min (ref >60.00)
Glucose, Bld: 129 mg/dL — ABNORMAL HIGH (ref 70–99)
Potassium: 3.2 meq/L — ABNORMAL LOW (ref 3.5–5.1)
Sodium: 138 meq/L (ref 135–145)
Total Bilirubin: 0.8 mg/dL (ref 0.2–1.2)
Total Protein: 7.1 g/dL (ref 6.0–8.3)

## 2023-07-09 LAB — CBC
HCT: 44 % (ref 36.0–46.0)
Hemoglobin: 14.2 g/dL (ref 12.0–15.0)
MCHC: 32.3 g/dL (ref 30.0–36.0)
MCV: 80.4 fl (ref 78.0–100.0)
Platelets: 270 10*3/uL (ref 150.0–400.0)
RBC: 5.47 Mil/uL — ABNORMAL HIGH (ref 3.87–5.11)
RDW: 17.6 % — ABNORMAL HIGH (ref 11.5–15.5)
WBC: 11.6 10*3/uL — ABNORMAL HIGH (ref 4.0–10.5)

## 2023-07-09 LAB — HEMOGLOBIN A1C: Hgb A1c MFr Bld: 7 % — ABNORMAL HIGH (ref 4.6–6.5)

## 2023-07-09 LAB — LIPASE: Lipase: 5 U/L — ABNORMAL LOW (ref 11.0–59.0)

## 2023-07-09 MED ORDER — ONDANSETRON 4 MG PO TBDP: 4.0000 mg | ORAL_TABLET | Freq: Three times a day (TID) | ORAL | 2 refills | Status: AC | PRN

## 2023-07-09 MED ORDER — PREGABALIN 150 MG PO CAPS: 150.0000 mg | ORAL_CAPSULE | Freq: Two times a day (BID) | ORAL | 3 refills | Status: DC

## 2023-07-09 NOTE — Assessment & Plan Note (Signed)
Lipids are at goal. Continue atorvastatin 40 mg daily.

## 2023-07-09 NOTE — Assessment & Plan Note (Signed)
 Stable. Continue pregabalin 150 mg bid.

## 2023-07-09 NOTE — Assessment & Plan Note (Signed)
 Blood pressure is at goal. Continue losartan 25 mg daily.

## 2023-07-09 NOTE — Assessment & Plan Note (Signed)
 Ms. Duchemin is experiencing acute grief on top of her chronic depression related to the recent death of her husband. I will see if we can move up her appointment with Dr. Tenny Craw. I will also refer her for counseling.

## 2023-07-09 NOTE — Assessment & Plan Note (Signed)
 I will check her A1c today. Continue insulin glargine 20 units daily and semaglutide (Ozempic) 2 mg weekly. Although she has had 23 lbs of weight loss since Dec., this may be due to her emotional distress and GI symptoms. I will check a lipase to make sure her GLP-1 RA is not causing pancreatitis.

## 2023-07-09 NOTE — Progress Notes (Signed)
 [facility] [address] [place] [address] Dept: [phone] Dept Fax: [phone]  Chronic Care Office Visit  Subjective:    Patient ID: [name], female    DOB: [birth year], 58 y.o..   MRN: [id number]  Chief Complaint  Patient presents with   Hypertension    3 month f/u HTN.   C/o having N&V, fatigue, diarrhea x 1 month with last week getting worse.    History of Present Illness:  Patient is in today for reassessment of chronic medical issues.  [name] shares that her husband died in early 02-Jul-2023. He had advanced laryngeal cancer. She notes it has been very difficult for her since that time. She has been frequently tearful. She is finding it very difficult to get out of the bed. She has also been experiencing recurrent nausea and vomiting, brought on by most foods and liquids. She has also been having recurrent diarrhea. She has been unable to keep some of her medicines down as well. She has a history of depression and ADHD. She is managed on Adderall, aripiprazole, and duloxetine by Dr. Diannia Ruder. She also takes clonazepam about twice a week. She is not engaged in counseling.  [name] has a history of Type 2 diabetes. She is managed on insulin glargine 20 units daily and semaglutide (Ozempic) 2 mg weekly. Her diabetes is complicated by peripheral neuropathy. This is managed with pregabalin (Lyrica) 150 mg bid.    [name] has a history of hypertension. She is managed on losartan 25 mg daily.   [name] has a history of COPD with chronic bronchitis. She is managed on albuterol (inhaler and nebulizer) and Duoneb.   Past Medical History: Patient Active Problem List   Diagnosis Date Noted   Coronary atherosclerosis 05/23/2022   Aortic atherosclerosis (HCC) 05/23/2022   Arthritis of left knee 12/14/2021   Acute respiratory failure with hypoxia (HCC) 06/02/2021   Marijuana use 03/14/2021    Allergic rhinitis 03/14/2021   Angina pectoris (HCC) 09/08/2019   Benzodiazepine overdose 03/05/2019   Generalized muscle ache 09/24/2018   Chronic right ear pain 03/11/2018   Chronic sinusitis 03/11/2018   H/O drug abuse (HCC) 11/12/2017   Atypical chest pain 10/05/2017   COPD with chronic bronchitis (HCC) 10/01/2017   Mixed hyperlipidemia 10/01/2017   Tobacco use disorder 10/01/2017   Diabetic peripheral neuropathy (HCC) 10/01/2017   Iron deficiency anemia 10/01/2017   Adult attention deficit hyperactivity disorder 01/14/2016   Major depressive disorder, recurrent severe without psychotic features (HCC)    Leukocytosis 03/25/2014   OSA on CPAP 04/11/2013   Perforated ear drum, right 05/22/2012   Bell's palsy 01/23/2012   Carpal tunnel syndrome on both sides 01/11/2012   Vitamin D deficiency 07/06/2011   Asthmatic bronchitis 06/11/2011   Back pain 06/11/2011   Morbid obesity with BMI of 45.0-49.9, adult (HCC) 05/06/2011   Gastric erosions 12/05/2010   Essential hypertension 12/02/2010   Type 2 diabetes mellitus with polyneuropathy (HCC) 12/02/2010   COPD with acute exacerbation (HCC) 11/30/2010   GERD (gastroesophageal reflux disease) 09/08/2010   Esophageal dysphagia 09/08/2010   Past Surgical History:  Procedure Laterality Date   cataract surgery Left 03/18/2023   CESAREAN SECTION  1988; 1989   CHOLECYSTECTOMY OPEN  1990   COLONOSCOPY  09/16/2010   MWU:[id number] colon/small internal hemorrhoids   ESOPHAGOGASTRODUODENOSCOPY  09/16/2010   SLF: normal/mild gastritis   ESOPHAGOGASTRODUODENOSCOPY N/A 10/19/2014   Procedure: ESOPHAGOGASTRODUODENOSCOPY (EGD);  Surgeon: West Bali,  MD;  Location: [facility];  Service: Endoscopy;  Laterality: N/A;  830   FRACTURE SURGERY     HYSTEROSCOPY WITH THERMACHOICE  01/17/2012   Procedure: HYSTEROSCOPY WITH THERMACHOICE;  Surgeon: Lazaro Arms, MD;  Location: [facility] ORS;  Service: Gynecology;  Laterality: N/A;  total therapy time:  9:13sec  D5W  18 ml in, D5W   18ml out, temperture 87degrees celcious   KIDNEY SURGERY     as child for blockages   LEFT HEART CATH AND CORONARY ANGIOGRAPHY N/A 09/08/2019   Procedure: LEFT HEART CATH AND CORONARY ANGIOGRAPHY;  Surgeon: [place], Peter M, MD;  [facility]: [facility] INVASIVE CV LAB;  Service: Cardiovascular;  Laterality: N/A;   TONSILLECTOMY     TUBAL LIGATION  1989   TYMPANOSTOMY TUBE PLACEMENT Bilateral    "several times when I was a child"   uterine ablation     WRIST FRACTURE SURGERY Left 1995   Family History  Problem Relation Age of Onset   Heart attack Mother 68       deceased   Diabetes Mother    Breast cancer Mother 66       inflammatory breast ca   Heart failure Mother        oxygen dependence, nonsmoker   Heart disease Mother    Depression Mother    Cancer Mother        Breast   Hypertension Mother    Hyperlipidemia Mother    Sudden death Mother    Sleep apnea Mother    Obesity Mother    Heart attack Father 77       deceased, etoh use   Heart disease Father    Alcohol abuse Father    Depression Father    Heart failure Father    Sudden death Father    Ulcers Sister    Hypertension Sister    Heart failure Sister    Depression Sister    Anxiety disorder Sister    Liver disease Maternal Aunt 40       died while on liver transplant list   Liver disease Maternal Uncle    Cancer Paternal Aunt        Breast   Heart disease Maternal Grandmother    Heart attack Maternal Grandmother        premature CAD   Colon cancer Neg Hx    Outpatient Medications Prior to Visit  Medication Sig Dispense Refill   ACCU-CHEK GUIDE test strip USE UP TO FOUR TIMES DAILY AS DIRECTED 200 strip 11   Accu-Chek Softclix Lancets lancets USE UP TO FOUR TIMES DAILY AS DIRECTED 300 each 0   albuterol (VENTOLIN HFA) 108 (90 Base) MCG/ACT inhaler Inhale 2 puffs into the lungs every 6 (six) hours as needed for wheezing or shortness of breath. 8 g 6   amphetamine-dextroamphetamine  (ADDERALL) 30 MG tablet Take 1 tablet by mouth 2 (two) times daily. 60 tablet 0   amphetamine-dextroamphetamine (ADDERALL) 30 MG tablet Take 1 tablet by mouth 2 (two) times daily. 60 tablet 0   amphetamine-dextroamphetamine (ADDERALL) 30 MG tablet Take 1 tablet by mouth 2 (two) times daily. 60 tablet 0   ARIPiprazole (ABILIFY) 5 MG tablet Take 1 tablet (5 mg total) by mouth daily. 90 tablet 1   atorvastatin (LIPITOR) 40 MG tablet TAKE 0.5 TABLETS (20 MG TOTAL) BY MOUTH 2 (TWO) TIMES DAILY. 90 tablet 3   bismuth subsalicylate (PEPTO BISMOL) 262 MG/15ML suspension Take 30 mLs by mouth every 6 (six) hours as  needed for indigestion.     blood glucose meter kit and supplies Dispense based on patient and insurance preference. Use up to four times daily as directed. (FOR ICD-10 E10.9, E11.9). 1 each 0   cephALEXin (KEFLEX) 500 MG capsule Take 1 capsule (500 mg total) by mouth 3 (three) times daily. 21 capsule 0   clonazePAM (KLONOPIN) 0.5 MG tablet Take 1 tablet (0.5 mg total) by mouth 2 (two) times daily as needed for anxiety. 60 tablet 2   DULoxetine (CYMBALTA) 60 MG capsule Take 1 capsule (60 mg total) by mouth in the morning. 90 capsule 2   fluticasone (FLONASE) 50 MCG/ACT nasal spray PLACE 1 SPRAY INTO BOTH NOSTRILS 2 (TWO) TIMES DAILY AS NEEDED FOR ALLERGIES OR RHINITIS (AFTER SINUS RINSE). (Patient taking differently: Place 1 spray into both nostrils in the morning and at bedtime.) 48 mL 2   ibuprofen (ADVIL) 200 MG tablet Take 600 mg by mouth every 6 (six) hours as needed for mild pain or headache.     insulin glargine (LANTUS) 100 UNIT/ML injection Inject 0.2 mLs (20 Units total) into the skin at bedtime. 10 mL 6   Insulin Pen Needle (BD PEN NEEDLE NANO 2ND GEN) 32G X 4 MM MISC USE DAILY WITH VICTOZA 100 each 11   Insulin Syringe-Needle U-100 (INSULIN SYRINGE .5CC/31GX5/16") 31G X 5/16" 0.5 ML MISC Inject 1 Units into the muscle daily. use as directed 100 each 2   ipratropium-albuterol (DUONEB)  0.5-2.5 (3) MG/3ML SOLN Take 3 mLs by nebulization 3 (three) times daily. 360 mL 3   losartan (COZAAR) 25 MG tablet TAKE 1 TABLET DAILY 90 tablet 3   meloxicam (MOBIC) 15 MG tablet Take 15 mg by mouth daily.     naproxen (NAPROSYN) 375 MG tablet Take 1 tablet (375 mg total) by mouth 2 (two) times daily. 20 tablet 0   pantoprazole (PROTONIX) 40 MG tablet TAKE 1 TABLET BY MOUTH TWICE A DAY 180 tablet 3   Semaglutide, 2 MG/DOSE, (OZEMPIC, 2 MG/DOSE,) 8 MG/3ML SOPN INJECT 2 MG AS DIRECTED ONCE A WEEK. 8 mL 6   umeclidinium-vilanterol (ANORO ELLIPTA) 62.5-25 MCG/ACT AEPB Inhale 1 puff into the lungs daily. 30 each 11   pregabalin (LYRICA) 150 MG capsule TAKE 1 CAPSULE BY MOUTH TWICE A DAY 180 capsule 3   albuterol (PROVENTIL) (2.5 MG/3ML) 0.083% nebulizer solution Take 3 mLs (2.5 mg total) by nebulization every 6 (six) hours as needed for wheezing or shortness of breath. 75 mL 0   No facility-administered medications prior to visit.   Allergies  Allergen Reactions   Codeine Itching and Nausea Only   Metformin And Related Diarrhea, Nausea Only and Other (See Comments)    Stomach pain/nausea   Wellbutrin [Bupropion Hcl] Hives   Acyclovir And Related Rash   Objective:   Today's Vitals   07/09/23 1303  BP: 110/62  Pulse: 93  Temp: (!) 97 F (36.1 C)  TempSrc: Temporal  SpO2: 99%  Weight: 248 lb (112.5 kg)  Height: 5\' 4"  (1.626 m)   Body mass index is 42.57 kg/m.   General: Well developed, well nourished. Mild emotional distress. Lungs: Clear to auscultation bilaterally. No wheezing, rales or rhonchi. CV: RRR without murmurs or rubs. Pulses 2+ bilaterally. Abdomen: Soft, non-tender. Bowel sounds positive, normal pitch and frequency. No hepatosplenomegaly. No   rebound or guarding. Psych: Alert and oriented. Tearful with a sad affect and moderately depressed mood.  Health Maintenance Due  Topic Date Due   Pneumococcal Vaccine 19-64 Years  old (2 of 2 - PCV) 02/19/2013   Zoster  Vaccines- Shingrix (1 of 2) Never done   OPHTHALMOLOGY EXAM  05/25/2021   MAMMOGRAM  11/23/2022   Lung Cancer Screening  05/23/2023   FOOT EXAM  06/21/2023   Colonoscopy  08/23/2023     Assessment & Plan:   Problem List Items Addressed This Visit       Cardiovascular and Mediastinum   Essential hypertension - Primary   Blood pressure is at goal. Continue losartan 25 mg daily.        Endocrine   Diabetic peripheral neuropathy (HCC)   Stable. Continue pregabalin 150 mg bid.      Relevant Medications   pregabalin (LYRICA) 150 MG capsule   Type 2 diabetes mellitus with polyneuropathy (HCC)   I will check her A1c today. Continue insulin glargine 20 units daily and semaglutide (Ozempic) 2 mg weekly. Although she has had 23 lbs of weight loss since Dec., this may be due to her emotional distress and GI symptoms. I will check a lipase to make sure her GLP-1 RA is not causing pancreatitis.      Relevant Medications   pregabalin (LYRICA) 150 MG capsule   Other Relevant Orders   Hemoglobin A1c     Other   Major depressive disorder, recurrent severe without psychotic features [facility])   Ms. Kohli is experiencing acute grief on top of her chronic depression related to the recent death of her husband. I will see if we can move up her appointment with Dr. Tenny Craw. I will also refer her for counseling.      Relevant Orders   Ambulatory referral to Psychiatry   Ambulatory referral to Psychology   Mixed hyperlipidemia   Lipids are at goal. Continue atorvastatin 40 mg daily.      Other Visit Diagnoses       Complicated bereavement       As above.   Relevant Orders   Ambulatory referral to Psychiatry   Ambulatory referral to Psychology     Generalized abdominal pain       Etiology is likely due to the emotional reaction to her husband's death. I will order labs and a CT to rule out other physical causes. Zofran for nausea.   Relevant Medications   ondansetron (ZOFRAN-ODT) 4 MG  disintegrating tablet   Other Relevant Orders   Comprehensive metabolic panel   CBC   Lipase   Urinalysis w microscopic + reflex cultur   CT ABDOMEN PELVIS WO CONTRAST       Return in about 4 weeks (around 08/06/2023) for Reassessment.   Loyola Mast, MD

## 2023-07-10 ENCOUNTER — Telehealth: Payer: Self-pay

## 2023-07-10 LAB — URINALYSIS W MICROSCOPIC + REFLEX CULTURE
Bilirubin Urine: NEGATIVE
Glucose, UA: NEGATIVE
Hyaline Cast: NONE SEEN /LPF
Leukocyte Esterase: NEGATIVE
Nitrites, Initial: NEGATIVE
Specific Gravity, Urine: 1.009 (ref 1.001–1.035)
pH: 7.5 (ref 5.0–8.0)

## 2023-07-10 LAB — NO CULTURE INDICATED

## 2023-07-10 NOTE — Telephone Encounter (Signed)
 PR for Pregablin submitted through cover my meds.  Awaiting response. Dm/cma   Key: [id number]

## 2023-07-10 NOTE — Telephone Encounter (Signed)
 PA approved from 07/10/23- 12/31/525.  Pharmacy notified VIA phone. Dm/cma

## 2023-07-29 ENCOUNTER — Other Ambulatory Visit: Payer: Self-pay | Admitting: Family Medicine

## 2023-07-29 DIAGNOSIS — E1142 Type 2 diabetes mellitus with diabetic polyneuropathy: Secondary | ICD-10-CM

## 2023-08-02 ENCOUNTER — Encounter (HOSPITAL_COMMUNITY): Payer: Self-pay | Admitting: Psychiatry

## 2023-08-02 ENCOUNTER — Telehealth (INDEPENDENT_AMBULATORY_CARE_PROVIDER_SITE_OTHER): Admitting: Psychiatry

## 2023-08-02 DIAGNOSIS — F901 Attention-deficit hyperactivity disorder, predominantly hyperactive type: Secondary | ICD-10-CM

## 2023-08-02 DIAGNOSIS — F331 Major depressive disorder, recurrent, moderate: Secondary | ICD-10-CM | POA: Diagnosis not present

## 2023-08-02 MED ORDER — AMPHETAMINE-DEXTROAMPHETAMINE 30 MG PO TABS: 30.0000 mg | ORAL_TABLET | Freq: Two times a day (BID) | ORAL | 0 refills | Status: DC

## 2023-08-02 MED ORDER — CLONAZEPAM 0.5 MG PO TABS: 0.5000 mg | ORAL_TABLET | Freq: Two times a day (BID) | ORAL | 2 refills | Status: DC | PRN

## 2023-08-02 MED ORDER — ARIPIPRAZOLE 5 MG PO TABS: 5.0000 mg | ORAL_TABLET | Freq: Every day | ORAL | 1 refills | Status: DC

## 2023-08-02 MED ORDER — DULOXETINE HCL 60 MG PO CPEP: 60.0000 mg | ORAL_CAPSULE | Freq: Every morning | ORAL | 2 refills | Status: DC

## 2023-08-02 NOTE — Progress Notes (Signed)
 Virtual Visit via Video Note  I connected with [name] on 08/02/23 at 11:20 AM EDT by a video enabled telemedicine application and verified that I am speaking with the correct person using two identifiers.  Location: Patient: home Provider: office   I discussed the limitations of evaluation and management by telemedicine and the availability of in person appointments. The patient expressed understanding and agreed to proceed.      I discussed the assessment and treatment plan with the patient. The patient was provided an opportunity to ask questions and all were answered. The patient agreed with the plan and demonstrated an understanding of the instructions.   The patient was advised to call back or seek an in-person evaluation if the symptoms worsen or if the condition fails to improve as anticipated.  I provided 20 minutes of non-face-to-face time during this encounter.   Diannia Ruder, MD  [facility] MD/PA/NP OP Progress Note  08/02/2023 11:49 AM [name]  MRN:  [id number]  Chief Complaint:  Chief Complaint  Patient presents with   Depression   Anxiety   ADD   Follow-up   HPI: This patient is a 58 year old widowed white female who lives with her daughter and 2 grandsons in [place].  She is on disability.  The patient returns for follow-up after 3 months regarding her depression and ADD.  Last time her husband was not doing well with his esophageal cancer.  He was hospitalized for pneumonia.  Since then he passed away on 2025-03-05as he continued to have aspiration pneumonia repeatedly.  He ended up in hospice care and was in a hospital bed at his mother's home.  The patient stayed there with him until his death.  She states that he died peacefully and she was gratified that he could spend time with his family.  She states that after he died she was not in good shape.  She was sick to her stomach all the time and throwing up and she lost about 15  pounds.  Her primary doctor put her on Zofran and she took more of the clonazepam.  This is finally settled down over the last 2 weeks.  She is enjoying time with her grand sons and her daughter.  She is still sad but not overtly depressed and certainly not suicidal.  She is no longer using much of the Klonopin.  She states for a while she was sleeping too much but now is back down to 8 hours a night.  Her focus is good with the Adderall. Visit Diagnosis:    ICD-10-CM   1. Moderate episode of recurrent major depressive disorder (HCC)  F33.1     2. Attention deficit hyperactivity disorder (ADHD), predominantly hyperactive type  F90.1       Past Psychiatric History: The patient was hospitalized twice in her 52s and again in 2006 and 2020 for drug overdose with suicidal intent   Past Medical History:  Past Medical History:  Diagnosis Date   ADHD    Alcohol abuse    Anemia    Anxiety    Asthmatic bronchitis    normal PFT/ seen by pulmonary no evidence of COPD   Atypical chest pain 10/05/2017   Back pain    Bilateral swelling of feet    Chest pain    Chest pain on respiration 03/25/2014   Chronic bronchitis (HCC)    Chronic lower back pain    Chronic respiratory failure (HCC) 10/16/2011   Newly 02  dep 24/7 p discharge from [facility] 01/2013  - 03/13/2014  Walked RA  2 laps @ 185 ft each stopped due to  Sob/ aching in legs, thirsty/ no desat @ slow pace    Chronic respiratory failure with hypoxia (HCC)    On 2-3 L of oxygen at home   Cigarette smoker 12/02/2010   Followed in Pulmonary clinic/ [facility]/ [place]   - Limits of effective care reviewed 12/22/2011     Complication of anesthesia    Constipation    COPD (chronic obstructive pulmonary disease) (HCC)    COPD with chronic bronchitis (HCC) 10/01/2017   Daily headache    Depression    Diabetic peripheral neuropathy (HCC)    DM (diabetes mellitus) type II controlled, neurological manifestation (HCC) 12/02/2010   Drug use     Gallbladder problem    Gastric erosions    EGD 08/2010.   GERD (gastroesophageal reflux disease)    Glaucoma    H/O drug abuse (HCC) 11/12/2017   -- scanned document from outside source: [facility] in [place] which showed UDS positive for Adderall /amphetamine usage as well as positive urine for THC.  This test result was collected 08/11/2016 and reported 10/07/2016. - lso review of the chart shows another positive amphetamine, THC and METH in the urine back on 10/18/2015 under "care everywhere". ---So    Heavy menses    High cholesterol    History of blood transfusion    "related to low HgB" (10/05/2017)   History of hiatal hernia    HTN (hypertension)    Hyperlipidemia associated with type 2 diabetes mellitus (HCC) 10/18/2015   Hypertension associated with diabetes (HCC) 12/02/2010   D/c acei 12/22/2011 due to psuedowheeze and narcotic dependent cough> ? Improved - 03/14/2014 started bystolic in place of cozar due to cough     IBS (irritable bowel syndrome)    Increased urinary protein excretion    Internal hemorrhoids    Colonoscopy 5/12.   Iron deficiency anemia 10/01/2017   Joint pain    Mixed diabetic hyperlipidemia associated with type 2 diabetes mellitus (HCC) 10/01/2017   On home oxygen therapy    "5L at night" (10/05/2017)   OSA on CPAP    Osteoarthritis    "back" (10/05/2017)   Oxygen dependent 10/16/2011   Pneumonia    "lots of times" (10/05/2017)   PONV (postoperative nausea and vomiting)    Poorly controlled diabetes mellitus (HCC) 11/12/2017   Pulmonary infiltrates 12/22/2011   Followed in [facility] clinic[facility] [place]    - See CT Chest 05/05/11    Shortness of breath    Tachycardia    never had test done since no insurance   Tobacco use disorder-current smoker greater than 40-pack-year history- since age 2 1 ppd 10/01/2017   Type II diabetes mellitus (HCC)    Vitamin D deficiency     Past Surgical History:  Procedure  Laterality Date   cataract surgery Left 03/18/2023   CESAREAN SECTION  1988; 1989   CHOLECYSTECTOMY OPEN  1990   COLONOSCOPY  09/16/2010   WJX:BJYNWG colon/small internal hemorrhoids   ESOPHAGOGASTRODUODENOSCOPY  09/16/2010   SLF: normal/mild gastritis   ESOPHAGOGASTRODUODENOSCOPY N/A 10/19/2014   Procedure: ESOPHAGOGASTRODUODENOSCOPY (EGD);  Surgeon: West Bali, MD;  Location: [facility] END[facility] SUITE;  Service: Endoscopy;  Laterality: N/A;  830   FRACTURE SURGERY     HYSTEROSCOPY WITH THERMACHOICE  01/17/2012   Procedure: HYSTEROSCOPY WITH THERMACHOICE;  Surgeon: Amaryllis Dyke  Despina Hidden, MD;  Location: [facility] ORS;  Service: Gynecology;  Laterality: N/A;  total therapy time: 9:13sec  D5W  18 ml in, D5W   18ml out, temperture 87degrees celcious   KIDNEY SURGERY     as child for blockages   LEFT HEART CATH AND CORONARY ANGIOGRAPHY N/A 09/08/2019   Procedure: LEFT HEART CATH AND CORONARY ANGIOGRAPHY;  Surgeon: [place], Peter M, MD;  Location: [facility] INVASIVE CV LAB;  Service: Cardiovascular;  Laterality: N/A;   TONSILLECTOMY     TUBAL LIGATION  1989   TYMPANOSTOMY TUBE PLACEMENT Bilateral    "several times when I was a child"   uterine ablation     WRIST FRACTURE SURGERY Left 1995    Family Psychiatric History: See below  Family History:  Family History  Problem Relation Age of Onset   Heart attack Mother 33       deceased   Diabetes Mother    Breast cancer Mother 2       inflammatory breast ca   Heart failure Mother        oxygen dependence, nonsmoker   Heart disease Mother    Depression Mother    Cancer Mother        Breast   Hypertension Mother    Hyperlipidemia Mother    Sudden death Mother    Sleep apnea Mother    Obesity Mother    Heart attack Father 81       deceased, etoh use   Heart disease Father    Alcohol abuse Father    Depression Father    Heart failure Father    Sudden death Father    Ulcers Sister    Hypertension Sister    Heart failure Sister    Depression Sister     Anxiety disorder Sister    Liver disease Maternal Aunt 40       died while on liver transplant list   Liver disease Maternal Uncle    Cancer Paternal Aunt        Breast   Heart disease Maternal Grandmother    Heart attack Maternal Grandmother        premature CAD   Colon cancer Neg Hx     Social History:  Social History   Socioeconomic History   Marital status: Married    Spouse name: Not on file   Number of children: 2   Years of education: Not on file   Highest education level: Not on file  Occupational History   Occupation: unemployed  Tobacco Use   Smoking status: Every Day    Current packs/day: 1.50    Average packs/day: 1.5 packs/day for 45.3 years (67.9 ttl pk-yrs)    Types: Cigarettes    Start date: 1980   Smokeless tobacco: Never   Tobacco comments:    vape  Vaping Use   Vaping status: Former  Substance and Sexual Activity   Alcohol use: Not Currently   Drug use: Yes    Types: Marijuana    Comment: occ   Sexual activity: Not Currently    Partners: Male    Birth control/protection: Surgical    Comment: BTL, younger than 16, more than 5  Other Topics Concern   Not on file  Social History Narrative   Has 2 step children.   Lives in multi-family home with children and grandchildren.   On disability.   Social Drivers of Corporate investment banker Strain: Low Risk  (02/09/2023)   Overall Physicist, medical Strain (  CARDIA)    Difficulty of Paying Living Expenses: Not hard at all  Food Insecurity: No Food Insecurity (02/09/2023)   Hunger Vital Sign    Worried About Running Out of Food in the Last Year: Never true    Ran Out of Food in the Last Year: Never true  Transportation Needs: No Transportation Needs (02/09/2023)   PRAPARE - Administrator, Civil Service (Medical): No    Lack of Transportation (Non-Medical): No  Physical Activity: Inactive (02/09/2023)   Exercise Vital Sign    Days of Exercise per Week: 0 days    Minutes of Exercise  per Session: 0 min  Stress: No Stress Concern Present (02/09/2023)   [employer] of Occupational Health - Occupational Stress Questionnaire    Feeling of Stress : Only a little  Social Connections: Moderately Integrated (02/09/2023)   Social Connection and Isolation Panel [NHANES]    Frequency of Communication with Friends and Family: More than three times a week    Frequency of Social Gatherings with Friends and Family: Not on file    Attends Religious Services: More than 4 times per year    Active Member of [employer] or Organizations: No    Attends Banker Meetings: Never    Marital Status: Married    Allergies:  Allergies  Allergen Reactions   Codeine Itching and Nausea Only   Metformin And Related Diarrhea, Nausea Only and Other (See Comments)    Stomach pain/nausea   Wellbutrin [Bupropion Hcl] Hives   Acyclovir And Related Rash    Metabolic Disorder Labs: Lab Results  Component Value Date   HGBA1C 7.0 (H) 07/09/2023   MPG 177.16 06/02/2021   MPG 188.64 03/05/2019   No results found for: "PROLACTIN" Lab Results  Component Value Date   CHOL 119 12/14/2021   TRIG 86.0 12/14/2021   HDL 44.10 12/14/2021   CHOLHDL 3 12/14/2021   VLDL 17.2 12/14/2021   LDLCALC 57 12/14/2021   LDLCALC 63 03/14/2021   Lab Results  Component Value Date   TSH 1.860 06/30/2019   TSH 2.40 11/26/2018    Therapeutic Level Labs: No results found for: "LITHIUM" No results found for: "VALPROATE" No results found for: "CBMZ"  Current Medications: Current Outpatient Medications  Medication Sig Dispense Refill   ACCU-CHEK GUIDE test strip USE UP TO FOUR TIMES DAILY AS DIRECTED 200 strip 11   Accu-Chek Softclix Lancets lancets USE UP TO FOUR TIMES DAILY AS DIRECTED 300 each 0   albuterol (PROVENTIL) (2.5 MG/3ML) 0.083% nebulizer solution Take 3 mLs (2.5 mg total) by nebulization every 6 (six) hours as needed for wheezing or shortness of breath. 75 mL 0   albuterol (VENTOLIN  HFA) 108 (90 Base) MCG/ACT inhaler Inhale 2 puffs into the lungs every 6 (six) hours as needed for wheezing or shortness of breath. 8 g 6   amphetamine-dextroamphetamine (ADDERALL) 30 MG tablet Take 1 tablet by mouth 2 (two) times daily. 60 tablet 0   amphetamine-dextroamphetamine (ADDERALL) 30 MG tablet Take 1 tablet by mouth 2 (two) times daily. 60 tablet 0   amphetamine-dextroamphetamine (ADDERALL) 30 MG tablet Take 1 tablet by mouth 2 (two) times daily. 60 tablet 0   ARIPiprazole (ABILIFY) 5 MG tablet Take 1 tablet (5 mg total) by mouth daily. 90 tablet 1   atorvastatin (LIPITOR) 40 MG tablet TAKE 0.5 TABLETS (20 MG TOTAL) BY MOUTH 2 (TWO) TIMES DAILY. 90 tablet 3   bismuth subsalicylate (PEPTO BISMOL) 262 MG/15ML suspension Take 30 mLs  by mouth every 6 (six) hours as needed for indigestion.     blood glucose meter kit and supplies Dispense based on patient and insurance preference. Use up to four times daily as directed. (FOR ICD-10 E10.9, E11.9). 1 each 0   cephALEXin (KEFLEX) 500 MG capsule Take 1 capsule (500 mg total) by mouth 3 (three) times daily. 21 capsule 0   clonazePAM (KLONOPIN) 0.5 MG tablet Take 1 tablet (0.5 mg total) by mouth 2 (two) times daily as needed for anxiety. 60 tablet 2   DULoxetine (CYMBALTA) 60 MG capsule Take 1 capsule (60 mg total) by mouth in the morning. 90 capsule 2   fluticasone (FLONASE) 50 MCG/ACT nasal spray PLACE 1 SPRAY INTO BOTH NOSTRILS 2 (TWO) TIMES DAILY AS NEEDED FOR ALLERGIES OR RHINITIS (AFTER SINUS RINSE). (Patient taking differently: Place 1 spray into both nostrils in the morning and at bedtime.) 48 mL 2   ibuprofen (ADVIL) 200 MG tablet Take 600 mg by mouth every 6 (six) hours as needed for mild pain or headache.     Insulin Pen Needle (BD PEN NEEDLE NANO 2ND GEN) 32G X 4 MM MISC USE DAILY WITH VICTOZA 100 each 11   Insulin Syringe-Needle U-100 (INSULIN SYRINGE .5CC/31GX5/16") 31G X 5/16" 0.5 ML MISC Inject 1 Units into the muscle daily. use as  directed 100 each 2   ipratropium-albuterol (DUONEB) 0.5-2.5 (3) MG/3ML SOLN Take 3 mLs by nebulization 3 (three) times daily. 360 mL 3   LANTUS 100 UNIT/ML injection INJECT 20 UNITS INTO THE SKIN DAILY WITH BREAKFAST 30 mL 3   losartan (COZAAR) 25 MG tablet TAKE 1 TABLET DAILY 90 tablet 3   meloxicam (MOBIC) 15 MG tablet Take 15 mg by mouth daily.     naproxen (NAPROSYN) 375 MG tablet Take 1 tablet (375 mg total) by mouth 2 (two) times daily. 20 tablet 0   ondansetron (ZOFRAN-ODT) 4 MG disintegrating tablet Take 1 tablet (4 mg total) by mouth every 8 (eight) hours as needed. 20 tablet 2   pantoprazole (PROTONIX) 40 MG tablet TAKE 1 TABLET BY MOUTH TWICE A DAY 180 tablet 3   pregabalin (LYRICA) 150 MG capsule Take 1 capsule (150 mg total) by mouth 2 (two) times daily. 180 capsule 3   Semaglutide, 2 MG/DOSE, (OZEMPIC, 2 MG/DOSE,) 8 MG/3ML SOPN INJECT 2 MG AS DIRECTED ONCE A WEEK. 8 mL 6   umeclidinium-vilanterol (ANORO ELLIPTA) 62.5-25 MCG/ACT AEPB Inhale 1 puff into the lungs daily. 30 each 11   No current facility-administered medications for this visit.     Musculoskeletal: Strength & Muscle Tone: within normal limits Gait & Station: normal Patient leans: N/A  Psychiatric Specialty Exam: Review of Systems  Psychiatric/Behavioral:  Positive for dysphoric mood.   All other systems reviewed and are negative.   Last menstrual period 07/24/2014.There is no height or weight on file to calculate BMI.  General Appearance: Casual and Fairly Groomed  Eye Contact:  Good  Speech:  Clear and Coherent  Volume:  Normal  Mood:  Dysphoric  Affect:  Flat  Thought Process:  Goal Directed  Orientation:  Full (Time, Place, and Person)  Thought Content: Rumination   Suicidal Thoughts:  No  Homicidal Thoughts:  No  Memory:  Immediate;   Good Recent;   Good Remote;   Good  Judgement:  Good  Insight:  Good  Psychomotor Activity:  Normal  Concentration:  Concentration: Good and Attention Span:  Good  Recall:  Good  Fund of Knowledge: Good  Language: Good  Akathisia:  No  Handed:  Right  AIMS (if indicated): not done  Assets:  Communication Skills Desire for Improvement Resilience Social Support Talents/Skills  ADL's:  Intact  Cognition: WNL  Sleep:  Good   Screenings: AIMS    Flowsheet Row Admission (Discharged) from 03/07/2019 in [facility] BE[facility]AVIORAL [facility]ICINE [facility]) [facility]  AIMS Total Score 0 0      AUDIT    Flowsheet Row Admission (Discharged) from 03/07/2019 in [facility]PATIENT BE[facility] MEDICINE Admission (Discharged[facility]  Alcohol Use Disorder Identification Test Final Score (AUDIT) 0 0      GAD-7    Flowsheet Row Office Visit from 07/09/2023 in [facility] [place] [facility] [employer] Visit from 03/20/2022 in [facility] [place] [facility] [employer] Visit from 03/14/2021 in [facility] [employer] Visit from 10[facility]20/2020 in [facility] [place] [facility] [employer] Visit from 11[facility]06/2019 in [facility] [place] [facility] [employer]  Total GAD-7 Score 8 8 6 9 5       PHQ2-9    Flowsheet Row Office Visit from 07/09/2023 in [facility] [place] [facility] Clinical Support from 02/09/2023 in [facility] [place] [facility] [employer] Visit from 12/20/2022 in [facility] [place] [facility] [employer] Visit from 09/19/2022 in [facility] [place] [facility] [employer] Visit from 03/20/2022 in [facility] [place] [facility] [employer]  PHQ-2 Total Score 6 0 0 0 3  PHQ-9 Total Score 18 2 1 2 9       Flowsheet Row ED from 06/15/2023 in [facility]) ED to Hosp-Admission (Discharged) from 06/09/2022 in [facility] Video Visit from 03/03/2022 in [facility] at [facility]  C-SSRS RISK CATEGORY No Risk No Risk No Risk        Assessment and Plan: This patient is a 58 year old female with a history of depression anxiety and ADD.  Despite the recent loss of her husband she seems to be doing well on her current regimen.  She will continue Cymbalta 60 mg daily for depression, clonazepam 0.5 mg twice daily for anxiety, Abilify 5 mg daily for antidepressant augmentation and Adderall 30 mg twice daily for ADD.  She will return to see me in 3 months  Collaboration of Care: Collaboration of Care: Primary Care Provider AEB notes are shared with PCP on the epic system  Patient/Guardian was advised Release of Information must be obtained prior to any record release in order to collaborate their care with an outside provider. Patient/Guardian was advised if they have not already done so to contact the registration department to sign all necessary forms in order for [place] to release information regarding their care.   Consent: Patient/Guardian gives verbal consent for treatment and assignment of benefits for services provided during this visit. Patient/Guardian expressed understanding and agreed to proceed.    Diannia Ruder, MD 08/02/2023, 11:49 AM

## 2023-08-03 ENCOUNTER — Ambulatory Visit
Admission: RE | Admit: 2023-08-03 | Discharge: 2023-08-03 | Disposition: A | Discharge status: Home / Self Care | Source: Ambulatory Visit | Attending: Family Medicine | Admitting: Family Medicine

## 2023-08-03 DIAGNOSIS — R1084 Generalized abdominal pain: Secondary | ICD-10-CM

## 2023-08-03 DIAGNOSIS — R109 Unspecified abdominal pain: Secondary | ICD-10-CM | POA: Diagnosis not present

## 2023-08-03 DIAGNOSIS — R112 Nausea with vomiting, unspecified: Secondary | ICD-10-CM | POA: Diagnosis not present

## 2023-08-06 ENCOUNTER — Encounter: Payer: Self-pay | Admitting: Family Medicine

## 2023-08-06 ENCOUNTER — Ambulatory Visit (INDEPENDENT_AMBULATORY_CARE_PROVIDER_SITE_OTHER): Admitting: Family Medicine

## 2023-08-06 VITALS — BP 116/70 | HR 112 | Temp 96.4°F | Ht 64.0 in | Wt 258.4 lb

## 2023-08-06 DIAGNOSIS — F4321 Adjustment disorder with depressed mood: Secondary | ICD-10-CM

## 2023-08-06 DIAGNOSIS — R1084 Generalized abdominal pain: Secondary | ICD-10-CM

## 2023-08-06 NOTE — Progress Notes (Signed)
 [facility] [address] [place] [address] Dept: [phone] Dept Fax: [phone]  Office Visit  Subjective:    Patient ID: [name], female    DOB: 1965/12/31, 58 y.o..   MRN: [id number]  Chief Complaint  Patient presents with   Hypertension    4 week f/u HTN.     History of Present Illness:  Patient is in today for reassessment of her recent GI symptoms. I had seen her on 2023-07-22. [name] husband died in early 06/28/2023. He had advanced laryngeal cancer. She was exhibiting signs of depression. She had also been experiencing recurrent nausea and vomiting, brought on by most foods and liquids and recurrent diarrhea. She has a history of depression and ADHD. She is managed on Adderall, aripiprazole, and duloxetine by Dr. Alfredia Annas. She also takes clonazepam about twice a week.  Today, [name] notes her nausea and vomiting are much improved. She feels she is workign through her grief. She did not go to counseling. She is aware of resources through Hospice.  Past Medical History: Patient Active Problem List   Diagnosis Date Noted   Coronary atherosclerosis 05/23/2022   Aortic atherosclerosis (HCC) 05/23/2022   Arthritis of left knee 12/14/2021   Acute respiratory failure with hypoxia (HCC) 06/02/2021   Marijuana use 03/14/2021   Allergic rhinitis 03/14/2021   Angina pectoris (HCC) 09/08/2019   Benzodiazepine overdose 03/05/2019   Generalized muscle ache 09/24/2018   Chronic right ear pain 03/11/2018   Chronic sinusitis 03/11/2018   H/O drug abuse (HCC) 11/12/2017   Atypical chest pain 10/05/2017   COPD with chronic bronchitis (HCC) 10/01/2017   Mixed hyperlipidemia 10/01/2017   Tobacco use disorder 10/01/2017   Diabetic peripheral neuropathy (HCC) 10/01/2017   Iron deficiency anemia 10/01/2017   Adult attention deficit hyperactivity disorder 01/14/2016   Major depressive disorder,  recurrent severe without psychotic features (HCC)    Leukocytosis 03/25/2014   OSA on CPAP 04/11/2013   Perforated ear drum, right 05/22/2012   Bell's palsy 01/23/2012   Carpal tunnel syndrome on both sides 01/11/2012   Vitamin D deficiency 07/06/2011   Asthmatic bronchitis 06/11/2011   Back pain 06/11/2011   Morbid obesity with BMI of 45.0-49.9, adult (HCC) 05/06/2011   Gastric erosions 12/05/2010   Essential hypertension 12/02/2010   Type 2 diabetes mellitus with polyneuropathy (HCC) 12/02/2010   COPD with acute exacerbation (HCC) 11/30/2010   GERD (gastroesophageal reflux disease) 09/08/2010   Esophageal dysphagia 09/08/2010   Past Surgical History:  Procedure Laterality Date   cataract surgery Left 03/18/2023   CESAREAN SECTION  1988; 1989   CHOLECYSTECTOMY OPEN  1990   COLONOSCOPY  09/16/2010   [id number] colon/small internal hemorrhoids   ESOPHAGOGASTRODUODENOSCOPY  09/16/2010   SLF: normal/mild gastritis   ESOPHAGOGASTRODUODENOSCOPY N/A 10/19/2014   Procedure: ESOPHAGOGASTRODUODENOSCOPY (EGD);  Surgeon: Alyce Jubilee, MD;  [facility];  Service: Endoscopy;  Laterality: N/A;  830   FRACTURE SURGERY     HYSTEROSCOPY WITH THERMACHOICE  01/17/2012   Procedure: HYSTEROSCOPY WITH THERMACHOICE;  Surgeon: Wendelyn Halter, MD;  Location: [facility] ORS;  Service: Gynecology;  Laterality: N/A;  total therapy time: 9:13sec  D5W  18 ml in, D5W   18ml out, temperture 87degrees celcious   KIDNEY SURGERY     as child for blockages   LEFT HEART CATH AND CORONARY ANGIOGRAPHY N/A 09/08/2019   Procedure: LEFT HEART CATH AND CORONARY ANGIOGRAPHY;  Surgeon: Swazil[place], Peter M, MD;  Location: [facility]  INVASIVE CV LAB;  Service: Cardiovascular;  Laterality: N/A;   TONSILLECTOMY     TUBAL LIGATION  1989   TYMPANOSTOMY TUBE PLACEMENT Bilateral    "several times when I was a child"   uterine ablation     WRIST FRACTURE SURGERY Left 1995   Family History  Problem Relation Age of Onset   Heart  attack Mother 20       deceased   Diabetes Mother    Breast cancer Mother 66       inflammatory breast ca   Heart failure Mother        oxygen dependence, nonsmoker   Heart disease Mother    Depression Mother    Cancer Mother        Breast   Hypertension Mother    Hyperlipidemia Mother    Sudden death Mother    Sleep apnea Mother    Obesity Mother    Heart attack Father 70       deceased, etoh use   Heart disease Father    Alcohol abuse Father    Depression Father    Heart failure Father    Sudden death Father    Ulcers Sister    Hypertension Sister    Heart failure Sister    Depression Sister    Anxiety disorder Sister    Liver disease Maternal Aunt 40       died while on liver transplant list   Liver disease Maternal Uncle    Cancer Paternal Aunt        Breast   Heart disease Maternal Grandmother    Heart attack Maternal Grandmother        premature CAD   Colon cancer Neg Hx    Outpatient Medications Prior to Visit  Medication Sig Dispense Refill   ACCU-CHEK GUIDE test strip USE UP TO FOUR TIMES DAILY AS DIRECTED 200 strip 11   Accu-Chek Softclix Lancets lancets USE UP TO FOUR TIMES DAILY AS DIRECTED 300 each 0   albuterol (VENTOLIN HFA) 108 (90 Base) MCG/ACT inhaler Inhale 2 puffs into the lungs every 6 (six) hours as needed for wheezing or shortness of breath. 8 g 6   amphetamine-dextroamphetamine (ADDERALL) 30 MG tablet Take 1 tablet by mouth 2 (two) times daily. 60 tablet 0   amphetamine-dextroamphetamine (ADDERALL) 30 MG tablet Take 1 tablet by mouth 2 (two) times daily. 60 tablet 0   amphetamine-dextroamphetamine (ADDERALL) 30 MG tablet Take 1 tablet by mouth 2 (two) times daily. 60 tablet 0   ARIPiprazole (ABILIFY) 5 MG tablet Take 1 tablet (5 mg total) by mouth daily. 90 tablet 1   atorvastatin (LIPITOR) 40 MG tablet TAKE 0.5 TABLETS (20 MG TOTAL) BY MOUTH 2 (TWO) TIMES DAILY. 90 tablet 3   bismuth subsalicylate (PEPTO BISMOL) 262 MG/15ML suspension Take 30  mLs by mouth every 6 (six) hours as needed for indigestion.     blood glucose meter kit and supplies Dispense based on patient and insurance preference. Use up to four times daily as directed. (FOR ICD-10 E10.9, E11.9). 1 each 0   cephALEXin (KEFLEX) 500 MG capsule Take 1 capsule (500 mg total) by mouth 3 (three) times daily. 21 capsule 0   clonazePAM (KLONOPIN) 0.5 MG tablet Take 1 tablet (0.5 mg total) by mouth 2 (two) times daily as needed for anxiety. 60 tablet 2   DULoxetine (CYMBALTA) 60 MG capsule Take 1 capsule (60 mg total) by mouth in the morning. 90 capsule 2   fluticasone (FLONASE)  50 MCG/ACT nasal spray PLACE 1 SPRAY INTO BOTH NOSTRILS 2 (TWO) TIMES DAILY AS NEEDED FOR ALLERGIES OR RHINITIS (AFTER SINUS RINSE). (Patient taking differently: Place 1 spray into both nostrils in the morning and at bedtime.) 48 mL 2   ibuprofen (ADVIL) 200 MG tablet Take 600 mg by mouth every 6 (six) hours as needed for mild pain or headache.     Insulin Pen Needle (BD PEN NEEDLE NANO 2ND GEN) 32G X 4 MM MISC USE DAILY WITH VICTOZA 100 each 11   Insulin Syringe-Needle U-100 (INSULIN SYRINGE .5CC/31GX5/16") 31G X 5/16" 0.5 ML MISC Inject 1 Units into the muscle daily. use as directed 100 each 2   ipratropium-albuterol (DUONEB) 0.5-2.5 (3) MG/3ML SOLN Take 3 mLs by nebulization 3 (three) times daily. 360 mL 3   LANTUS 100 UNIT/ML injection INJECT 20 UNITS INTO THE SKIN DAILY WITH BREAKFAST 30 mL 3   losartan (COZAAR) 25 MG tablet TAKE 1 TABLET DAILY 90 tablet 3   meloxicam (MOBIC) 15 MG tablet Take 15 mg by mouth daily.     naproxen (NAPROSYN) 375 MG tablet Take 1 tablet (375 mg total) by mouth 2 (two) times daily. 20 tablet 0   ondansetron (ZOFRAN-ODT) 4 MG disintegrating tablet Take 1 tablet (4 mg total) by mouth every 8 (eight) hours as needed. 20 tablet 2   pantoprazole (PROTONIX) 40 MG tablet TAKE 1 TABLET BY MOUTH TWICE A DAY 180 tablet 3   pregabalin (LYRICA) 150 MG capsule Take 1 capsule (150 mg total)  by mouth 2 (two) times daily. 180 capsule 3   Semaglutide, 2 MG/DOSE, (OZEMPIC, 2 MG/DOSE,) 8 MG/3ML SOPN INJECT 2 MG AS DIRECTED ONCE A WEEK. 8 mL 6   umeclidinium-vilanterol (ANORO ELLIPTA) 62.5-25 MCG/ACT AEPB Inhale 1 puff into the lungs daily. 30 each 11   albuterol (PROVENTIL) (2.5 MG/3ML) 0.083% nebulizer solution Take 3 mLs (2.5 mg total) by nebulization every 6 (six) hours as needed for wheezing or shortness of breath. 75 mL 0   No facility-administered medications prior to visit.   Allergies  Allergen Reactions   Codeine Itching and Nausea Only   Metformin And Related Diarrhea, Nausea Only and Other (See Comments)    Stomach pain/nausea   Wellbutrin [Bupropion Hcl] Hives   Acyclovir And Related Rash     Objective:   Today's Vitals   08/06/23 1540  BP: 116/70  Pulse: (!) 112  Temp: (!) 96.4 F (35.8 C)  TempSrc: Temporal  SpO2: 96%  Weight: 258 lb 6.4 oz (117.2 kg)  Height: 5\' 4"  (1.626 m)   Body mass index is 44.35 kg/m.   General: Well developed, well nourished. No acute distress. Psych: Alert and oriented. Normal mood and affect.  Health Maintenance Due  Topic Date Due   Pneumococcal Vaccine 43-61 Years old (2 of 2 - PCV) 02/19/2013   Zoster Vaccines- Shingrix (1 of 2) Never done   OPHTHALMOLOGY EXAM  05/25/2021   MAMMOGRAM  11/23/2022   Lung Cancer Screening  05/23/2023   FOOT EXAM  06/21/2023   Colonoscopy  08/23/2023   Lab Results    Latest Ref Rng & Units 07/09/2023    1:40 PM 06/15/2023    6:37 PM 06/13/2022    5:00 AM  CBC  WBC 4.0 - 10.5 K/uL 11.6  17.0  10.5   Hemoglobin 12.0 - 15.0 g/dL 09.8  11.9  14.7   Hematocrit 36.0 - 46.0 % 44.0  43.7  43.0   Platelets 150.0 - 400.0 K/uL  270.0  280  145        Latest Ref Rng & Units 07/09/2023    1:40 PM 06/15/2023    6:37 PM 03/30/2023    4:02 PM  CMP  Glucose 70 - 99 mg/dL 536  644  CANCELED   BUN 6 - 23 mg/dL 6  10    Creatinine 0.34 - 1.20 mg/dL 7.42  5.95    Sodium 638 - 145 mEq/L 138  137     Potassium 3.5 - 5.1 mEq/L 3.2  4.2    Chloride 96 - 112 mEq/L 95  95    CO2 19 - 32 mEq/L 31  26    Calcium 8.4 - 10.5 mg/dL 9.6  9.4    Total Protein 6.0 - 8.3 g/dL 7.1     Total Bilirubin 0.2 - 1.2 mg/dL 0.8     Alkaline Phos 39 - 117 U/L 67     AST 0 - 37 U/L 15     ALT 0 - 35 U/L 14      Last hemoglobin A1c Lab Results  Component Value Date   HGBA1C 7.0 (H) 07/09/2023   Component Ref Range & Units (hover) 4 wk ago 6 yr ago 8 yr ago  Lipase 5.0 Low  47 R 30 R   Component Ref Range & Units (hover) 4 wk ago (07/09/23)  Color, Urine YELLOW  APPearance CLEAR  Specific Gravity, Urine 1.009  pH 7.5  Glucose, UA NEGATIVE  Bilirubin Urine NEGATIVE  Ketones, ur TRACE Abnormal   Hgb urine dipstick TRACE Abnormal   Protein, ur 1+ Abnormal   Nitrites, Initial NEGATIVE  Leukocyte Esterase NEGATIVE  WBC, UA 0-5  RBC / HPF 0-2  Squamous Epithelial / HPF 0-5  Bacteria, UA FEW Abnormal   Hyaline Cast NONE SEEN      Assessment & Plan:   Problem List Items Addressed This Visit   None Visit Diagnoses       Generalized abdominal pain    -  Primary   Lab evaluation was benign. CT result pending, but grossly normal. Symptoms are resolved. Charise Companion was an aspect of grief.     Complicated bereavement       Improved. Recommend she consider counseling options through Hospice.       Return in about 2 months (around 10/06/2023) for Reassessment.   Graig Lawyer, MD

## 2023-08-13 ENCOUNTER — Ambulatory Visit
Admission: RE | Admit: 2023-08-13 | Discharge: 2023-08-13 | Disposition: A | Discharge status: Home / Self Care | Source: Ambulatory Visit | Attending: Family Medicine | Admitting: Family Medicine

## 2023-08-13 DIAGNOSIS — Z1231 Encounter for screening mammogram for malignant neoplasm of breast: Secondary | ICD-10-CM

## 2023-08-15 ENCOUNTER — Encounter: Payer: Self-pay | Admitting: Family Medicine

## 2023-08-15 ENCOUNTER — Other Ambulatory Visit: Payer: Self-pay | Admitting: Family Medicine

## 2023-08-15 DIAGNOSIS — N6452 Nipple discharge: Secondary | ICD-10-CM

## 2023-08-22 ENCOUNTER — Other Ambulatory Visit: Payer: Self-pay | Admitting: Family

## 2023-08-22 ENCOUNTER — Other Ambulatory Visit: Payer: Self-pay | Admitting: Family Medicine

## 2023-08-22 DIAGNOSIS — E1142 Type 2 diabetes mellitus with diabetic polyneuropathy: Secondary | ICD-10-CM

## 2023-08-22 MED ORDER — OZEMPIC (2 MG/DOSE) 8 MG/3ML ~~LOC~~ SOPN
2.0000 mg | PEN_INJECTOR | SUBCUTANEOUS | 1 refills | Status: DC
Start: 2023-08-22 — End: 2024-01-02

## 2023-08-23 ENCOUNTER — Ambulatory Visit
Admission: RE | Admit: 2023-08-23 | Discharge: 2023-08-23 | Disposition: A | Discharge status: Home / Self Care | Source: Ambulatory Visit | Attending: Family Medicine | Admitting: Family Medicine

## 2023-08-23 DIAGNOSIS — Z08 Encounter for follow-up examination after completed treatment for malignant neoplasm: Secondary | ICD-10-CM | POA: Diagnosis not present

## 2023-08-23 DIAGNOSIS — N6452 Nipple discharge: Secondary | ICD-10-CM

## 2023-08-23 DIAGNOSIS — N611 Abscess of the breast and nipple: Secondary | ICD-10-CM | POA: Diagnosis not present

## 2023-09-06 ENCOUNTER — Other Ambulatory Visit: Payer: Self-pay | Admitting: Family Medicine

## 2023-09-06 DIAGNOSIS — E782 Mixed hyperlipidemia: Secondary | ICD-10-CM

## 2023-09-06 DIAGNOSIS — I10 Essential (primary) hypertension: Secondary | ICD-10-CM

## 2023-10-05 ENCOUNTER — Ambulatory Visit (INDEPENDENT_AMBULATORY_CARE_PROVIDER_SITE_OTHER): Admitting: Family Medicine

## 2023-10-05 VITALS — BP 124/68 | HR 86 | Temp 97.6°F | Ht 64.0 in | Wt 245.4 lb

## 2023-10-05 DIAGNOSIS — F17200 Nicotine dependence, unspecified, uncomplicated: Secondary | ICD-10-CM | POA: Diagnosis not present

## 2023-10-05 DIAGNOSIS — I10 Essential (primary) hypertension: Secondary | ICD-10-CM

## 2023-10-05 DIAGNOSIS — J4489 Other specified chronic obstructive pulmonary disease: Secondary | ICD-10-CM

## 2023-10-05 DIAGNOSIS — M5441 Lumbago with sciatica, right side: Secondary | ICD-10-CM

## 2023-10-05 DIAGNOSIS — M5442 Lumbago with sciatica, left side: Secondary | ICD-10-CM

## 2023-10-05 DIAGNOSIS — Z7985 Long-term (current) use of injectable non-insulin antidiabetic drugs: Secondary | ICD-10-CM

## 2023-10-05 DIAGNOSIS — E1142 Type 2 diabetes mellitus with diabetic polyneuropathy: Secondary | ICD-10-CM | POA: Diagnosis not present

## 2023-10-05 DIAGNOSIS — Z794 Long term (current) use of insulin: Secondary | ICD-10-CM

## 2023-10-05 DIAGNOSIS — G8929 Other chronic pain: Secondary | ICD-10-CM

## 2023-10-05 DIAGNOSIS — Z1211 Encounter for screening for malignant neoplasm of colon: Secondary | ICD-10-CM

## 2023-10-05 DIAGNOSIS — E782 Mixed hyperlipidemia: Secondary | ICD-10-CM

## 2023-10-05 DIAGNOSIS — Z6841 Body Mass Index (BMI) 40.0 and over, adult: Secondary | ICD-10-CM

## 2023-10-05 NOTE — Assessment & Plan Note (Signed)
 I will check her A1c today. Continue insulin  glargine 20 units daily and semaglutide  (Ozempic ) 2 mg weekly.

## 2023-10-05 NOTE — Assessment & Plan Note (Signed)
 Prior history of lumbar disc disease. Having progressive back pain. Patient is working at weight loss. I will refer her to orthopedics to consider if there may be interventions that could help reduce some of her back pain.

## 2023-10-05 NOTE — Assessment & Plan Note (Signed)
 I continue to advise smoking cessation and encouraged her to continue to make attempts. I offered a retrial of Chantix if she decides to try and quit.  I spent 4 minutes counseling the patient about tobacco cessation.

## 2023-10-05 NOTE — Progress Notes (Signed)
 [facility] [address] [place] [address] Dept: [phone] Dept Fax: [phone]  Chronic Care Office Visit  Subjective:    Patient ID: [name], female    DOB: 11-Dec-1965, 58 y.o..   MRN: [id number]  Chief Complaint  Patient presents with   Hypertension    2 month f/u HTN. C/o back pain off/on.  Taking Tylenol  and Ibuprofen .    History of Present Illness:  Patient is in today for reassessment of chronic medical issues.  Ms. Bunyan has a history of Type 2 diabetes. She is managed on insulin  glargine (Lantus ) 20 units daily and semaglutide  (Ozempic ) 2 mg weekly. Her diabetes is complicated by peripheral neuropathy. This is managed with pregabalin  (Lyrica ) 150 mg bid.    Ms. Tschetter has a history of hypertension. She is managed on losartan  25 mg daily.   Ms. Danser has a history of COPD with chronic bronchitis. She is managed on Anoro Ellipta  daily and PRN albuterol  (inhaler and nebulizer) and Duonebs. She continues to smoke about 1 1/2 ppd. She had used bupropion  in the past, but developed hives. She failed at Chantix previously.  Ms. Delancey notes she is having progressive issues with low back pain. She finds this is exacerbated by prolonged standing, such as when she does the dishes. She gets some relief with rest at that point, but this does not completely resolve her issue. She remains on meloxicam  and does feel this has helped. She takes some ibuprofen  and Tylenol  episodically. She notes radiation of the pain into the anterior thigh bilaterally.  Past Medical History: Patient Active Problem List   Diagnosis Date Noted   Coronary atherosclerosis 05/23/2022   Aortic atherosclerosis (HCC) 05/23/2022   Arthritis of left knee 12/14/2021   Acute respiratory failure with hypoxia (HCC) 06/02/2021   Marijuana use 03/14/2021   Allergic rhinitis 03/14/2021   Angina pectoris (HCC) 09/08/2019   Benzodiazepine  overdose 03/05/2019   Generalized muscle ache 09/24/2018   Chronic right ear pain 03/11/2018   Chronic sinusitis 03/11/2018   H/O drug abuse (HCC) 11/12/2017   Atypical chest pain 10/05/2017   COPD with chronic bronchitis (HCC) 10/01/2017   Mixed hyperlipidemia 10/01/2017   Tobacco use disorder 10/01/2017   Diabetic peripheral neuropathy (HCC) 10/01/2017   Iron deficiency anemia 10/01/2017   Adult attention deficit hyperactivity disorder 01/14/2016   Major depressive disorder, recurrent severe without psychotic features (HCC)    Leukocytosis 03/25/2014   OSA on CPAP 04/11/2013   Perforated ear drum, right 05/22/2012   Bell's palsy 01/23/2012   Carpal tunnel syndrome on both sides 01/11/2012   Vitamin D  deficiency 07/06/2011   Asthmatic bronchitis 06/11/2011   Low back pain 06/11/2011   Morbid obesity with body mass index (BMI) of 40.0 to 44.9 in adult [facility]) 05/06/2011   Gastric erosions 12/05/2010   Essential hypertension 12/02/2010   Type 2 diabetes mellitus with polyneuropathy (HCC) 12/02/2010   COPD with acute exacerbation (HCC) 11/30/2010   GERD (gastroesophageal reflux disease) 09/08/2010   Esophageal dysphagia 09/08/2010   Past Surgical History:  Procedure Laterality Date   cataract surgery Left 03/18/2023   CESAREAN SECTION  1988; 1989   CHOLECYSTECTOMY OPEN  1990   COLONOSCOPY  09/16/2010   [id number] colon/small internal hemorrhoids   ESOPHAGOGASTRODUODENOSCOPY  09/16/2010   SLF: normal/mild gastritis   ESOPHAGOGASTRODUODENOSCOPY N/A 10/19/2014   Procedure: ESOPHAGOGASTRODUODENOSCOPY (EGD);  Surgeon: Alyce Jubilee, MD;  [facility];  Service: Endoscopy;  Laterality: N/A;  830   FRACTURE SURGERY     HYSTEROSCOPY WITH THERMACHOICE  01/17/2012   Procedure: HYSTEROSCOPY WITH THERMACHOICE;  Surgeon: Wendelyn Halter, MD;  Location: [facility];  Service: Gynecology;  Laterality: N/A;  total therapy time: 9:13sec  D5W  18 ml in, D5W   18ml out, temperture 87degrees  celcious   KIDNEY SURGERY     as child for blockages   LEFT HEART CATH AND CORONARY ANGIOGRAPHY N/A 09/08/2019   Procedure: LEFT HEART CATH AND CORONARY ANGIOGRAPHY;  Surgeon: Swaziland, Peter M, MD;  Location: [facility] INVASIVE CV LAB;  Service: Cardiovascular;  Laterality: N/A;   TONSILLECTOMY     TUBAL LIGATION  1989   TYMPANOSTOMY TUBE PLACEMENT Bilateral    several times when I was a child   uterine ablation     WRIST FRACTURE SURGERY Left 1995   Family History  Problem Relation Age of Onset   Heart attack Mother 83       deceased   Diabetes Mother    Breast cancer Mother 104       inflammatory breast ca   Heart failure Mother        oxygen  dependence, nonsmoker   Heart disease Mother    Depression Mother    Cancer Mother        Breast   Hypertension Mother    Hyperlipidemia Mother    Sudden death Mother    Sleep apnea Mother    Obesity Mother    Heart attack Father 52       deceased, etoh use   Heart disease Father    Alcohol abuse Father    Depression Father    Heart failure Father    Sudden death Father    Ulcers Sister    Hypertension Sister    Heart failure Sister    Depression Sister    Anxiety disorder Sister    Liver disease Maternal Aunt 40       died while on liver transplant list   Liver disease Maternal Uncle    Cancer Paternal Aunt        Breast   Heart disease Maternal Grandmother    Heart attack Maternal Grandmother        premature CAD   Colon cancer Neg Hx    Outpatient Medications Prior to Visit  Medication Sig Dispense Refill   ACCU-CHEK GUIDE test strip USE UP TO FOUR TIMES DAILY AS DIRECTED 200 strip 11   Accu-Chek Softclix Lancets lancets USE UP TO FOUR TIMES DAILY AS DIRECTED 300 each 0   albuterol  (PROVENTIL ) (2.5 MG/3ML) 0.083% nebulizer solution Take 3 mLs (2.5 mg total) by nebulization every 6 (six) hours as needed for wheezing or shortness of breath. 75 mL 0   albuterol  (VENTOLIN  HFA) 108 (90 Base) MCG/ACT inhaler Inhale 2 puffs into  the lungs every 6 (six) hours as needed for wheezing or shortness of breath. 8 g 6   amphetamine -dextroamphetamine  (ADDERALL) 30 MG tablet Take 1 tablet by mouth 2 (two) times daily. 60 tablet 0   amphetamine -dextroamphetamine  (ADDERALL) 30 MG tablet Take 1 tablet by mouth 2 (two) times daily. 60 tablet 0   amphetamine -dextroamphetamine  (ADDERALL) 30 MG tablet Take 1 tablet by mouth 2 (two) times daily. 60 tablet 0   ARIPiprazole  (ABILIFY ) 5 MG tablet Take 1 tablet (5 mg total) by mouth daily. 90 tablet 1   atorvastatin  (LIPITOR) 40 MG tablet TAKE 0.5 TABLETS (20 MG TOTAL) BY MOUTH 2 (TWO) TIMES DAILY. 90 tablet 3  bismuth subsalicylate (PEPTO BISMOL) 262 MG/15ML suspension Take 30 mLs by mouth every 6 (six) hours as needed for indigestion.     blood glucose meter kit and supplies Dispense based on patient and insurance preference. Use up to four times daily as directed. (FOR ICD-10 E10.9, E11.9). 1 each 0   clonazePAM  (KLONOPIN ) 0.5 MG tablet Take 1 tablet (0.5 mg total) by mouth 2 (two) times daily as needed for anxiety. 60 tablet 2   DULoxetine  (CYMBALTA ) 60 MG capsule Take 1 capsule (60 mg total) by mouth in the morning. 90 capsule 2   fluticasone  (FLONASE ) 50 MCG/ACT nasal spray PLACE 1 SPRAY INTO BOTH NOSTRILS 2 (TWO) TIMES DAILY AS NEEDED FOR ALLERGIES OR RHINITIS (AFTER SINUS RINSE). 48 mL 2   ibuprofen  (ADVIL ) 200 MG tablet Take 600 mg by mouth every 6 (six) hours as needed for mild pain or headache.     Insulin  Pen Needle (BD PEN NEEDLE NANO 2ND GEN) 32G X 4 MM MISC USE DAILY WITH VICTOZA  100 each 11   Insulin  Syringe-Needle U-100 (INSULIN  SYRINGE .5CC/31GX5/16) 31G X 5/16 0.5 ML MISC Inject 1 Units into the muscle daily. use as directed 100 each 2   ipratropium-albuterol  (DUONEB) 0.5-2.5 (3) MG/3ML SOLN Take 3 mLs by nebulization 3 (three) times daily. 360 mL 3   LANTUS  100 UNIT/ML injection INJECT 20 UNITS INTO THE SKIN DAILY WITH BREAKFAST 30 mL 3   losartan  (COZAAR ) 25 MG tablet TAKE  1 TABLET DAILY 90 tablet 3   meloxicam  (MOBIC ) 15 MG tablet Take 15 mg by mouth daily.     naproxen  (NAPROSYN ) 375 MG tablet Take 1 tablet (375 mg total) by mouth 2 (two) times daily. 20 tablet 0   ondansetron  (ZOFRAN -ODT) 4 MG disintegrating tablet Take 1 tablet (4 mg total) by mouth every 8 (eight) hours as needed. 20 tablet 2   pantoprazole  (PROTONIX ) 40 MG tablet TAKE 1 TABLET BY MOUTH TWICE A DAY 180 tablet 3   pregabalin  (LYRICA ) 150 MG capsule Take 1 capsule (150 mg total) by mouth 2 (two) times daily. 180 capsule 3   Semaglutide , 2 MG/DOSE, (OZEMPIC , 2 MG/DOSE,) 8 MG/3ML SOPN Inject 2 mg into the skin once a week. 8 mL 1   umeclidinium-vilanterol (ANORO ELLIPTA ) 62.5-25 MCG/ACT AEPB Inhale 1 puff into the lungs daily. 30 each 11   cephALEXin  (KEFLEX ) 500 MG capsule Take 1 capsule (500 mg total) by mouth 3 (three) times daily. 21 capsule 0   No facility-administered medications prior to visit.   Allergies  Allergen Reactions   Codeine  Itching and Nausea Only   Metformin  And Related Diarrhea, Nausea Only and Other (See Comments)    Stomach pain/nausea   Wellbutrin  [Bupropion  Hcl] Hives   Acyclovir And Related Rash   Objective:   Today's Vitals   10/05/23 1505  BP: 124/68  Pulse: 86  Temp: 97.6 F (36.4 C)  TempSrc: Temporal  SpO2: 95%  Weight: 245 lb 6.4 oz (111.3 kg)  Height: 5' 4 (1.626 m)   Body mass index is 42.12 kg/m.   General: Well developed, well nourished. No acute distress. Feet- Skin intact. No sign of maceration between toes. Nails are normal. Dorsalis pedis and   posterior tibial artery pulses are normal. 5.07 monofilament testing shows some limited areas   of loss of sensation.Aaron Aas Psych: Alert and oriented. Normal mood and affect.  Health Maintenance Due  Topic Date Due   Pneumococcal Vaccine 62-38 Years old (2 of 2 - PCV) 02/19/2013  Zoster Vaccines- Shingrix (1 of 2) Never done   OPHTHALMOLOGY EXAM  05/25/2021   Lung Cancer Screening  05/23/2023    Colonoscopy  08/23/2023     Assessment & Plan:   Problem List Items Addressed This Visit       Cardiovascular and Mediastinum   Essential hypertension - Primary   Blood pressure is at goal. Continue losartan  25 mg daily.        Respiratory   COPD with chronic bronchitis (HCC)   Stable. Continue Anoro Ellipta  and albuterol  PRN.        Endocrine   Diabetic peripheral neuropathy (HCC)   Stable. Continue pregabalin  150 mg bid.      Type 2 diabetes mellitus with polyneuropathy (HCC)   I will check her A1c today. Continue insulin  glargine 20 units daily and semaglutide  (Ozempic ) 2 mg weekly.       Relevant Orders   Hemoglobin A1c   Basic metabolic panel with GFR     Other   Tobacco use disorder (Chronic)   I continue to advise smoking cessation and encouraged her to continue to make attempts. I offered a retrial of Chantix if she decides to try and quit.  I spent 4 minutes counseling the patient about tobacco cessation.       Relevant Orders   CT CHEST LUNG CANCER SCREENING LOW DOSE WO CONTRAST   Low back pain   Prior history of lumbar disc disease. Having progressive back pain. Patient is working at weight loss. I will refer her to orthopedics to consider if there may be interventions that could help reduce some of her back pain.      Relevant Orders   Ambulatory referral to Orthopedics   Mixed hyperlipidemia   Lipids are at goal. Continue atorvastatin  40 mg daily.      Morbid obesity with body mass index (BMI) of 40.0 to 44.9 in adult (HCC)   Maximum weight: 290 lbs (11/2021) Current weight: 245 lbs Weight change since last visit: - 13 lbs Total weight loss: -45 lbs (15.5 %)  Continued gradual weight loss. Ozempic  likely plays a role in this. I encourage her to be more active as well. We will continue to follow.      Other Visit Diagnoses       Screening for colon cancer       Relevant Orders   Cologuard       Return in about 3 months (around  01/05/2024) for Reassessment.   Graig Lawyer, MD

## 2023-10-05 NOTE — Assessment & Plan Note (Addendum)
 Stable. Continue Anoro Ellipta  and albuterol  PRN.

## 2023-10-05 NOTE — Assessment & Plan Note (Signed)
 Stable. Continue pregabalin 150 mg bid.

## 2023-10-05 NOTE — Assessment & Plan Note (Signed)
 Maximum weight: 290 lbs (11/2021) Current weight: 245 lbs Weight change since last visit: - 13 lbs Total weight loss: -45 lbs (15.5 %)  Continued gradual weight loss. Ozempic  likely plays a role in this. I encourage her to be more active as well. We will continue to follow.

## 2023-10-05 NOTE — Assessment & Plan Note (Signed)
 Blood pressure is at goal. Continue losartan 25 mg daily.

## 2023-10-05 NOTE — Assessment & Plan Note (Signed)
Lipids are at goal. Continue atorvastatin 40 mg daily.

## 2023-10-06 LAB — BASIC METABOLIC PANEL WITH GFR
BUN: 24 mg/dL (ref 7–25)
CO2: 29 mmol/L (ref 20–32)
Calcium: 9 mg/dL (ref 8.6–10.4)
Chloride: 106 mmol/L (ref 98–110)
Creat: 0.98 mg/dL (ref 0.50–1.03)
Glucose, Bld: 110 mg/dL — ABNORMAL HIGH (ref 65–99)
Potassium: 3.9 mmol/L (ref 3.5–5.3)
Sodium: 143 mmol/L (ref 135–146)
eGFR: 67 mL/min/{1.73_m2} (ref >=60)

## 2023-10-06 LAB — HEMOGLOBIN A1C
Hgb A1c MFr Bld: 7.4 % — ABNORMAL HIGH (ref <5.7)
Mean Plasma Glucose: 166 mg/dL
eAG (mmol/L): 9.2 mmol/L

## 2023-10-08 ENCOUNTER — Ambulatory Visit: Payer: Self-pay | Admitting: Family Medicine

## 2023-10-08 DIAGNOSIS — R195 Other fecal abnormalities: Secondary | ICD-10-CM

## 2023-10-12 ENCOUNTER — Encounter: Payer: Self-pay | Admitting: Family Medicine

## 2023-10-16 ENCOUNTER — Ambulatory Visit
Admission: RE | Admit: 2023-10-16 | Discharge: 2023-10-16 | Disposition: A | Discharge status: Home / Self Care | Source: Ambulatory Visit | Attending: Family Medicine | Admitting: Family Medicine

## 2023-10-16 DIAGNOSIS — F17200 Nicotine dependence, unspecified, uncomplicated: Secondary | ICD-10-CM

## 2023-10-16 DIAGNOSIS — Z122 Encounter for screening for malignant neoplasm of respiratory organs: Secondary | ICD-10-CM | POA: Diagnosis not present

## 2023-10-16 DIAGNOSIS — F17210 Nicotine dependence, cigarettes, uncomplicated: Secondary | ICD-10-CM | POA: Diagnosis not present

## 2023-10-23 DIAGNOSIS — Z1211 Encounter for screening for malignant neoplasm of colon: Secondary | ICD-10-CM | POA: Diagnosis not present

## 2023-10-29 DIAGNOSIS — R195 Other fecal abnormalities: Secondary | ICD-10-CM | POA: Insufficient documentation

## 2023-10-29 LAB — COLOGUARD: COLOGUARD: POSITIVE — AB

## 2023-10-30 ENCOUNTER — Other Ambulatory Visit (INDEPENDENT_AMBULATORY_CARE_PROVIDER_SITE_OTHER): Payer: Self-pay

## 2023-10-30 ENCOUNTER — Ambulatory Visit: Admitting: Physical Medicine and Rehabilitation

## 2023-10-30 ENCOUNTER — Encounter: Payer: Self-pay | Admitting: Physical Medicine and Rehabilitation

## 2023-10-30 DIAGNOSIS — M545 Low back pain, unspecified: Secondary | ICD-10-CM

## 2023-10-30 DIAGNOSIS — G8929 Other chronic pain: Secondary | ICD-10-CM | POA: Diagnosis not present

## 2023-10-30 DIAGNOSIS — R202 Paresthesia of skin: Secondary | ICD-10-CM | POA: Diagnosis not present

## 2023-10-30 NOTE — Progress Notes (Unsigned)
 [name] - 58 y.o. female MRN [id number]  Date of birth: 06-May-1965  Office Visit Note: Visit Date: 10/30/2023 PCP: Thedora Garnette HERO, MD Referred by: Thedora Garnette HERO, MD  Subjective: Chief Complaint  Patient presents with   Lower Back - Pain   HPI: [name] is a 58 y.o. female who comes in today per the request of Dr. Garnette Thedora for evaluation of chronic, worsening and severe bilateral lower back pain. Also reports intermittent tingling to bilateral legs and feet. Pain ongoing for several years. Her pain worsens with prolonged standing and walking. Also reports pain to lower back when moving from sitting to standing position. Sitting seems to help alleviate her pain. She describes pain as dull and aching sensation, currently rates as 8 out of 10. Some relief of pain with home exercise regimen, rest and use of medications. Lumbar MRI imaging from 2015 shows diffusely diminished bone marrow signal, which is nonspecific but can be seen in the setting of anemia, smoking, and obesity. No high grade spinal canal stenosis noted. Lumbar radiographs from 2021 shows minor thoracolumbar degenerative change. No recent imaging of lumbar spine. No history of lumbar surgery/injections. Patient denies focal weakness. No recent trauma or falls.   Patients course is complicated by COPD, acute respiratory failure with hypoxia, diabetes mellitus, diabetic peripheral neuropathy, tobacco use, drug abuse, depression and morbid obesity.      Review of Systems  Musculoskeletal:  Positive for back pain.  Neurological:  Positive for tingling. Negative for focal weakness and weakness.  All other systems reviewed and are negative.  Otherwise per HPI.  Assessment & Plan: Visit Diagnoses:    ICD-10-CM   1. Chronic bilateral low back pain without sciatica  M54.50 XR Lumbar Spine 2-3 Views   G89.29 MR LUMBAR SPINE WO CONTRAST    2. Paresthesia of skin  R20.2 MR LUMBAR SPINE WO  CONTRAST    3. Morbid (severe) obesity due to excess calories (HCC)  E66.01 MR LUMBAR SPINE WO CONTRAST       Plan: Findings:  Chronic, worsening and severe bilateral lower back pain. She does experience intermittent paresthesias to bilateral legs and feet. Lower back pain is biggest pain generator for her. Patient continues to have severe pain despite good conservative therapies such as home exercise regimen, rest and use of medications. Patients clinical presentation and exam are complex, differentials include facet mediated pain. She does have mild pain with lumbar extension today. I am also concerned about central canal stenosis. She does carry diagnosis of diabetic polyneuropathy that could be contributing to lower extremity paresthesias. I obtained lumbar radiographs in the office today that shows mild multi level degenerative changes, no spondylolisthesis. We discussed treatment plan in detail today. Next step is to obtain new lumbar MRI imaging. Depending on results of imaging we discussed possibility of performing lumbar injections. I also feel short course of formal physical therapy would be beneficial for her. She has no questions at this time. We will see her back for lumbar MRI review. No red flag symptoms noted upon exam today.     Meds & Orders: No orders of the defined types were placed in this encounter.   Orders Placed This Encounter  Procedures   XR Lumbar Spine 2-3 Views   MR LUMBAR SPINE WO CONTRAST    Follow-up: Return for Lumbar MRI review.   Procedures: No procedures performed      Clinical History: No specialty comments available.   She reports that  she has been smoking cigarettes. She started smoking about 45 years ago. She has a 68.3 pack-year smoking history. She has never used smokeless tobacco.  Recent Labs    03/30/23 1602 06/15/23 1837 07/09/23 1340 10/05/23 1532  HGBA1C 6.5*  --  7.0* 7.4*  LABURIC  --  4.8  --   --     Objective:  VS:  HT:    WT:    BMI:     BP:   HR: bpm  TEMP: ( )  RESP:  Physical Exam Vitals and nursing note reviewed.  HENT:     Head: Normocephalic and atraumatic.     Right Ear: External ear normal.     Left Ear: External ear normal.     Nose: Nose normal.     Mouth/Throat:     Mouth: Mucous membranes are moist.  Eyes:     Extraocular Movements: Extraocular movements intact.  Cardiovascular:     Rate and Rhythm: Normal rate.     Pulses: Normal pulses.  Pulmonary:     Effort: Pulmonary effort is normal.  Abdominal:     General: Abdomen is flat. There is no distension.  Musculoskeletal:        General: Tenderness present.     Cervical back: Normal range of motion.     Comments: Patient rises from seated position to standing without difficulty. Pain noted with facet loading and lumbar extension. 5/5 strength noted with bilateral hip flexion, knee flexion/extension, ankle dorsiflexion/plantarflexion and EHL. No clonus noted bilaterally. No pain upon palpation of greater trochanters. No pain with internal/external rotation of bilateral hips. Sensation intact bilaterally. Myofascial tenderness noted to bilateral lumbar paraspinal region. Negative slump test bilaterally. Ambulates without aid, gait steady.     Skin:    General: Skin is warm and dry.     Capillary Refill: Capillary refill takes less than 2 seconds.  Neurological:     General: No focal deficit present.     Mental Status: She is alert and oriented to person, place, and time.  Psychiatric:        Mood and Affect: Mood normal.        Behavior: Behavior normal.     Ortho Exam  Imaging: XR Lumbar Spine 2-3 Views Result Date: 10/30/2023 [facility] and lateral radiographs of lumbar spine show normal segmentation and alignment, no spondylolisthesis. Mild multi level degenerative changes, there is anterior spurring noted to endplates of T12 and L1 vertebrae. No fractures or dislocations.    Past Medical/Family/Surgical/Social History: Medications &  Allergies reviewed per EMR, new medications updated. Patient Active Problem List   Diagnosis Date Noted   Positive colorectal cancer screening using Cologuard test 10/29/2023   Coronary atherosclerosis 05/23/2022   Aortic atherosclerosis (HCC) 05/23/2022   Arthritis of left knee 12/14/2021   Acute respiratory failure with hypoxia (HCC) 06/02/2021   Marijuana use 03/14/2021   Allergic rhinitis 03/14/2021   Angina pectoris (HCC) 09/08/2019   Benzodiazepine overdose 03/05/2019   Generalized muscle ache 09/24/2018   Chronic right ear pain 03/11/2018   Chronic sinusitis 03/11/2018   H/O drug abuse (HCC) 11/12/2017   Atypical chest pain 10/05/2017   COPD with chronic bronchitis (HCC) 10/01/2017   Mixed hyperlipidemia 10/01/2017   Tobacco use disorder 10/01/2017   Diabetic peripheral neuropathy (HCC) 10/01/2017   Iron deficiency anemia 10/01/2017   Adult attention deficit hyperactivity disorder 01/14/2016   Major depressive disorder, recurrent severe without psychotic features (HCC)    Leukocytosis 03/25/2014   OSA on  CPAP 04/11/2013   Perforated ear drum, right 05/22/2012   Bell's palsy 01/23/2012   Carpal tunnel syndrome on both sides 01/11/2012   Vitamin D  deficiency 07/06/2011   Asthmatic bronchitis 06/11/2011   Low back pain 06/11/2011   Morbid obesity with body mass index (BMI) of 40.0 to 44.9 in adult [facility]) 05/06/2011   Gastric erosions 12/05/2010   Essential hypertension 12/02/2010   Type 2 diabetes mellitus with polyneuropathy (HCC) 12/02/2010   COPD with acute exacerbation (HCC) 11/30/2010   GERD (gastroesophageal reflux disease) 09/08/2010   Esophageal dysphagia 09/08/2010   Past Medical History:  Diagnosis Date   ADHD    Alcohol abuse    Anemia    Anxiety    Asthmatic bronchitis    normal PFT/ seen by pulmonary no evidence of COPD   Atypical chest pain 10/05/2017   Back pain    Bilateral swelling of feet    Chest pain    Chest pain on respiration 03/25/2014    Chronic bronchitis (HCC)    Chronic lower back pain    Chronic respiratory failure (HCC) 10/16/2011   Newly 02 dep 24/7 p discharge from [facility] 01/2013  - 03/13/2014  Walked RA  2 laps @ 185 ft each stopped due to  Sob/ aching in legs, thirsty/ no desat @ slow pace    Chronic respiratory failure with hypoxia (HCC)    On 2-3 L of oxygen  at home   Cigarette smoker 12/02/2010   Followed in Pulmonary clinic/ [facility]/ [place]   - Limits of effective care reviewed 12/22/2011     Complication of anesthesia    Constipation    COPD (chronic obstructive pulmonary disease) (HCC)    COPD with chronic bronchitis (HCC) 10/01/2017   Daily headache    Depression    Diabetic peripheral neuropathy (HCC)    DM (diabetes mellitus) type II controlled, neurological manifestation (HCC) 12/02/2010   Drug use    Gallbladder problem    Gastric erosions    EGD 08/2010.   GERD (gastroesophageal reflux disease)    Glaucoma    H/O drug abuse (HCC) 11/12/2017   -- scanned document from outside source: [facility] in [place]  which showed UDS positive for Adderall /amphetamine  usage as well as positive urine for THC.  This test result was collected 08/11/2016 and reported 10/07/2016. - lso review of the chart shows another positive amphetamine , THC and METH in the urine back on 10/18/2015 under care everywhere. ---So    Heavy menses    High cholesterol    History of blood transfusion    related to low HgB (10/05/2017)   History of hiatal hernia    HTN (hypertension)    Hyperlipidemia associated with type 2 diabetes mellitus (HCC) 10/18/2015   Hypertension associated with diabetes (HCC) 12/02/2010   D/c acei 12/22/2011 due to psuedowheeze and narcotic dependent cough> ? Improved - 03/14/2014 started bystolic in place of cozar due to cough     IBS (irritable bowel syndrome)    Increased urinary protein excretion    Internal hemorrhoids    Colonoscopy 5/12.   Iron deficiency  anemia 10/01/2017   Joint pain    Mixed diabetic hyperlipidemia associated with type 2 diabetes mellitus (HCC) 10/01/2017   On home oxygen  therapy    5L at night (10/05/2017)   OSA on CPAP    Osteoarthritis    back (10/05/2017)   Oxygen  dependent 10/16/2011   Pneumonia    lots of  times (10/05/2017)   PONV (postoperative nausea and vomiting)    Poorly controlled diabetes mellitus (HCC) 11/12/2017   Pulmonary infiltrates 12/22/2011   Followed in Pulmonary clinic/ [facility]/ [place]    - See CT Chest 05/05/11    Shortness of breath    Tachycardia    never had test done since no insurance   Tobacco use disorder-current smoker greater than 40-pack-year history- since age 42 1 ppd 10/01/2017   Type II diabetes mellitus (HCC)    Vitamin D  deficiency    Family History  Problem Relation Age of Onset   Heart attack Mother 59       deceased   Diabetes Mother    Breast cancer Mother 5       inflammatory breast ca   Heart failure Mother        oxygen  dependence, nonsmoker   Heart disease Mother    Depression Mother    Cancer Mother        Breast   Hypertension Mother    Hyperlipidemia Mother    Sudden death Mother    Sleep apnea Mother    Obesity Mother    Heart attack Father 79       deceased, etoh use   Heart disease Father    Alcohol abuse Father    Depression Father    Heart failure Father    Sudden death Father    Ulcers Sister    Hypertension Sister    Heart failure Sister    Depression Sister    Anxiety disorder Sister    Liver disease Maternal Aunt 40       died while on liver transplant list   Liver disease Maternal Uncle    Cancer Paternal Aunt        Breast   Heart disease Maternal Grandmother    Heart attack Maternal Grandmother        premature CAD   Colon cancer Neg Hx    Past Surgical History:  Procedure Laterality Date   cataract surgery Left 03/18/2023   CESAREAN SECTION  1988; 1989   CHOLECYSTECTOMY OPEN  1990   COLONOSCOPY  09/16/2010    DOQ:wnmfjo colon/small internal hemorrhoids   ESOPHAGOGASTRODUODENOSCOPY  09/16/2010   SLF: normal/mild gastritis   ESOPHAGOGASTRODUODENOSCOPY N/A 10/19/2014   Procedure: ESOPHAGOGASTRODUODENOSCOPY (EGD);  Surgeon: Margo LITTIE Haddock, MD;  Location: [facility];  Service: Endoscopy;  Laterality: N/A;  830   FRACTURE SURGERY     HYSTEROSCOPY WITH THERMACHOICE  01/17/2012   Procedure: HYSTEROSCOPY WITH THERMACHOICE;  Surgeon: Vonn VEAR Inch, MD;  Location: [facility] ORS;  Service: Gynecology;  Laterality: N/A;  total therapy time: 9:13sec  D5W  18 ml in, D5W   18ml out, temperture 87degrees celcious   KIDNEY SURGERY     as child for blockages   LEFT HEART CATH AND CORONARY ANGIOGRAPHY N/A 09/08/2019   Procedure: LEFT HEART CATH AND CORONARY ANGIOGRAPHY;  Surgeon: [place], Peter M, MD;  Location: [facility];  Service: Cardiovascular;  Laterality: N/A;   TONSILLECTOMY     TUBAL LIGATION  1989   TYMPANOSTOMY TUBE PLACEMENT Bilateral    several times when I was a child   uterine ablation     WRIST FRACTURE SURGERY Left 1995   Social History   Occupational History   Occupation: unemployed  Tobacco Use   Smoking status: Every Day    Current packs/day: 1.50    Average packs/day: 1.5 packs/day for 45.5 years (68.3 ttl pk-yrs)  Types: Cigarettes    Start date: 38   Smokeless tobacco: Never   Tobacco comments:    vape  Vaping Use   Vaping status: Former  Substance and Sexual Activity   Alcohol use: Not Currently   Drug use: Yes    Types: Marijuana    Comment: occ   Sexual activity: Not Currently    Partners: Male    Birth control/protection: Surgical    Comment: BTL, younger than 16, more than 5

## 2023-10-30 NOTE — Progress Notes (Unsigned)
 Pain Scale   Average Pain 4 Patient advised she has lower back pain when she walks and stands, Patient advising her pain lessens when she sits down and rests         +Driver, -BT, -Dye Allergies.

## 2023-11-07 ENCOUNTER — Encounter: Payer: Self-pay | Admitting: Physical Medicine and Rehabilitation

## 2023-11-14 ENCOUNTER — Inpatient Hospital Stay
Admission: RE | Admit: 2023-11-14 | Discharge: 2023-11-14 | Discharge status: Home / Self Care | Source: Ambulatory Visit | Attending: Physical Medicine and Rehabilitation | Admitting: Physical Medicine and Rehabilitation

## 2023-11-14 DIAGNOSIS — G8929 Other chronic pain: Secondary | ICD-10-CM

## 2023-11-14 DIAGNOSIS — M47816 Spondylosis without myelopathy or radiculopathy, lumbar region: Secondary | ICD-10-CM | POA: Diagnosis not present

## 2023-11-14 DIAGNOSIS — M5136 Other intervertebral disc degeneration, lumbar region with discogenic back pain only: Secondary | ICD-10-CM | POA: Diagnosis not present

## 2023-11-14 DIAGNOSIS — R202 Paresthesia of skin: Secondary | ICD-10-CM

## 2023-11-20 ENCOUNTER — Ambulatory Visit: Payer: Self-pay

## 2023-11-23 ENCOUNTER — Telehealth (INDEPENDENT_AMBULATORY_CARE_PROVIDER_SITE_OTHER): Admitting: Psychiatry

## 2023-11-23 ENCOUNTER — Encounter (HOSPITAL_COMMUNITY): Payer: Self-pay | Admitting: Psychiatry

## 2023-11-23 DIAGNOSIS — F901 Attention-deficit hyperactivity disorder, predominantly hyperactive type: Secondary | ICD-10-CM

## 2023-11-23 DIAGNOSIS — F331 Major depressive disorder, recurrent, moderate: Secondary | ICD-10-CM

## 2023-11-23 MED ORDER — CLONAZEPAM 0.5 MG PO TABS: 0.5000 mg | ORAL_TABLET | Freq: Two times a day (BID) | ORAL | 2 refills | Status: DC | PRN

## 2023-11-23 MED ORDER — ARIPIPRAZOLE 5 MG PO TABS: 5.0000 mg | ORAL_TABLET | Freq: Every day | ORAL | 1 refills | Status: DC

## 2023-11-23 MED ORDER — AMPHETAMINE-DEXTROAMPHETAMINE 30 MG PO TABS: 30.0000 mg | ORAL_TABLET | Freq: Two times a day (BID) | ORAL | 0 refills | Status: DC

## 2023-11-23 MED ORDER — DULOXETINE HCL 60 MG PO CPEP: 60.0000 mg | ORAL_CAPSULE | Freq: Every morning | ORAL | 2 refills | Status: DC

## 2023-11-23 NOTE — Progress Notes (Signed)
 Virtual Visit via Video Note  I connected with [name] on 11/23/23 at 10:00 AM EDT by a video enabled telemedicine application and verified that I am speaking with the correct person using two identifiers.  Location: Patient: home Provider: office   I discussed the limitations of evaluation and management by telemedicine and the availability of in person appointments. The patient expressed understanding and agreed to proceed.     I discussed the assessment and treatment plan with the patient. The patient was provided an opportunity to ask questions and all were answered. The patient agreed with the plan and demonstrated an understanding of the instructions.   The patient was advised to call back or seek an in-person evaluation if the symptoms worsen or if the condition fails to improve as anticipated.  I provided 20 minutes of non-face-to-face time during this encounter.   Barnie Gull, MD  [facility] MD/PA/NP OP Progress Note  11/23/2023 10:10 AM [name]  MRN:  [id number]  Chief Complaint:  Chief Complaint  Patient presents with   Depression   Anxiety   ADD   Follow-up   HPI: This patient is a 58 year old widowed white female who lives with her daughter and 2 grandsons in [place]. She is on disability.   The patient returns for follow-up after 3 months regarding her depression and ADD.  Overall she is doing well.  As noted last time her husband passed away on 02-16-25from esophageal cancer.  The patient is trying to stay active and busy particularly with her grandchildren.  Her mood has been stable and she denies significant depression anxiety difficulty with sleep or thoughts of self-harm.  She is focusing well with the Adderall. Visit Diagnosis:    ICD-10-CM   1. Moderate episode of recurrent major depressive disorder (HCC)  F33.1     2. Attention deficit hyperactivity disorder (ADHD), predominantly hyperactive type  F90.1       Past  Psychiatric History: The patient was hospitalized twice in her 66s and again in 2006 and 2020 for drug overdose with suicidal intent   Past Medical History:  Past Medical History:  Diagnosis Date   ADHD    Alcohol abuse    Anemia    Anxiety    Asthmatic bronchitis    normal PFT/ seen by pulmonary no evidence of COPD   Atypical chest pain 10/05/2017   Back pain    Bilateral swelling of feet    Chest pain    Chest pain on respiration 03/25/2014   Chronic bronchitis (HCC)    Chronic lower back pain    Chronic respiratory failure (HCC) 10/16/2011   Newly 02 dep 24/7 p discharge from [facility] 01/2013  - 03/13/2014  Walked RA  2 laps @ 185 ft each stopped due to  Sob/ aching in legs, thirsty/ no desat @ slow pace    Chronic respiratory failure with hypoxia (HCC)    On 2-3 L of oxygen  at home   Cigarette smoker 12/02/2010   Followed in Pulmonary clinic/ [facility]/ [place]   - Limits of effective care reviewed 12/22/2011     Complication of anesthesia    Constipation    COPD (chronic obstructive pulmonary disease) (HCC)    COPD with chronic bronchitis (HCC) 10/01/2017   Daily headache    Depression    Diabetic peripheral neuropathy (HCC)    DM (diabetes mellitus) type II controlled, neurological manifestation (HCC) 12/02/2010   Drug use    Gallbladder problem  Gastric erosions    EGD 08/2010.   GERD (gastroesophageal reflux disease)    Glaucoma    H/O drug abuse (HCC) 11/12/2017   -- scanned document from outside source: [facility] in [place]  which showed UDS positive for Adderall /amphetamine  usage as well as positive urine for THC.  This test result was collected 08/11/2016 and reported 10/07/2016. - lso review of the chart shows another positive amphetamine , THC and METH in the urine back on 10/18/2015 under care everywhere. ---So    Heavy menses    High cholesterol    History of blood transfusion    related to low HgB (10/05/2017)   History  of hiatal hernia    HTN (hypertension)    Hyperlipidemia associated with type 2 diabetes mellitus (HCC) 10/18/2015   Hypertension associated with diabetes (HCC) 12/02/2010   D/c acei 12/22/2011 due to psuedowheeze and narcotic dependent cough> ? Improved - 03/14/2014 started bystolic in place of cozar due to cough     IBS (irritable bowel syndrome)    Increased urinary protein excretion    Internal hemorrhoids    Colonoscopy 5/12.   Iron deficiency anemia 10/01/2017   Joint pain    Mixed diabetic hyperlipidemia associated with type 2 diabetes mellitus (HCC) 10/01/2017   On home oxygen  therapy    5L at night (10/05/2017)   OSA on CPAP    Osteoarthritis    back (10/05/2017)   Oxygen  dependent 10/16/2011   Pneumonia    lots of times (10/05/2017)   PONV (postoperative nausea and vomiting)    Poorly controlled diabetes mellitus (HCC) 11/12/2017   Pulmonary infiltrates 12/22/2011   Followed in Pulmonary clinic/ [facility]/ [place]    - See CT Chest 05/05/11    Shortness of breath    Tachycardia    never had test done since no insurance   Tobacco use disorder-current smoker greater than 40-pack-year history- since age 89 1 ppd 10/01/2017   Type II diabetes mellitus (HCC)    Vitamin D  deficiency     Past Surgical History:  Procedure Laterality Date   cataract surgery Left 03/18/2023   CESAREAN SECTION  1988; 1989   CHOLECYSTECTOMY OPEN  1990   COLONOSCOPY  09/16/2010   DOQ:wnmfjo colon/small internal hemorrhoids   ESOPHAGOGASTRODUODENOSCOPY  09/16/2010   SLF: normal/mild gastritis   ESOPHAGOGASTRODUODENOSCOPY N/A 10/19/2014   Procedure: ESOPHAGOGASTRODUODENOSCOPY (EGD);  Surgeon: Margo LITTIE Haddock, MD;  Location: [facility];  Service: Endoscopy;  Laterality: N/A;  830   FRACTURE SURGERY     HYSTEROSCOPY WITH THERMACHOICE  01/17/2012   Procedure: HYSTEROSCOPY WITH THERMACHOICE;  Surgeon: Vonn VEAR Inch, MD;  Location: [facility] ORS;  Service: Gynecology;  Laterality: N/A;  total therapy  time: 9:13sec  D5W  18 ml in, D5W   18ml out, temperture 87degrees celcious   KIDNEY SURGERY     as child for blockages   LEFT HEART CATH AND CORONARY ANGIOGRAPHY N/A 09/08/2019   Procedure: LEFT HEART CATH AND CORONARY ANGIOGRAPHY;  Surgeon: Swazil[place], Peter M, MD;  Location: [facility] INVASIVE CV LAB;  Service: Cardiovascular;  Laterality: N/A;   TONSILLECTOMY     TUBAL LIGATION  1989   TYMPANOSTOMY TUBE PLACEMENT Bilateral    several times when I was a child   uterine ablation     WRIST FRACTURE SURGERY Left 1995    Family Psychiatric History: See below  Family History:  Family History  Problem Relation Age of Onset   Heart attack Mother  55       deceased   Diabetes Mother    Breast cancer Mother 77       inflammatory breast ca   Heart failure Mother        oxygen  dependence, nonsmoker   Heart disease Mother    Depression Mother    Cancer Mother        Breast   Hypertension Mother    Hyperlipidemia Mother    Sudden death Mother    Sleep apnea Mother    Obesity Mother    Heart attack Father 72       deceased, etoh use   Heart disease Father    Alcohol abuse Father    Depression Father    Heart failure Father    Sudden death Father    Ulcers Sister    Hypertension Sister    Heart failure Sister    Depression Sister    Anxiety disorder Sister    Liver disease Maternal Aunt 40       died while on liver transplant list   Liver disease Maternal Uncle    Cancer Paternal Aunt        Breast   Heart disease Maternal Grandmother    Heart attack Maternal Grandmother        premature CAD   Colon cancer Neg Hx     Social History:  Social History   Socioeconomic History   Marital status: Married    Spouse name: Not on file   Number of children: 2   Years of education: Not on file   Highest education level: Not on file  Occupational History   Occupation: unemployed  Tobacco Use   Smoking status: Every Day    Current packs/day: 1.50    Average packs/day: 1.5 packs/day  for 45.6 years (68.4 ttl pk-yrs)    Types: Cigarettes    Start date: 1980   Smokeless tobacco: Never   Tobacco comments:    vape  Vaping Use   Vaping status: Former  Substance and Sexual Activity   Alcohol use: Not Currently   Drug use: Yes    Types: Marijuana    Comment: occ   Sexual activity: Not Currently    Partners: Male    Birth control/protection: Surgical    Comment: BTL, younger than 16, more than 5  Other Topics Concern   Not on file  Social History Narrative   Has 2 step children.   Lives in multi-family home with children and grandchildren.   On disability.   Social Drivers of Corporate investment banker Strain: Low Risk  (02/09/2023)   Overall Financial Resource Strain (CARDIA)    Difficulty of Paying Living Expenses: Not hard at all  Food Insecurity: No Food Insecurity (02/09/2023)   Hunger Vital Sign    Worried About Running Out of Food in the Last Year: Never true    Ran Out of Food in the Last Year: Never true  Transportation Needs: No Transportation Needs (02/09/2023)   PRAPARE - Administrator, Civil Service (Medical): No    Lack of Transportation (Non-Medical): No  Physical Activity: Inactive (02/09/2023)   Exercise Vital Sign    Days of Exercise per Week: 0 days    Minutes of Exercise per Session: 0 min  Stress: No Stress Concern Present (02/09/2023)   [employer] of Occupational Health - Occupational Stress Questionnaire    Feeling of Stress : Only a little  Social Connections: Moderately Integrated (02/09/2023)  Social Connection and Isolation Panel    Frequency of Communication with Friends and Family: More than three times a week    Frequency of Social Gatherings with Friends and Family: Not on file    Attends Religious Services: More than 4 times per year    Active Member of [employer] or Organizations: No    Attends Banker Meetings: Never    Marital Status: Married    Allergies:  Allergies  Allergen  Reactions   Codeine  Itching and Nausea Only   Metformin  And Related Diarrhea, Nausea Only and Other (See Comments)    Stomach pain/nausea   Wellbutrin  [Bupropion  Hcl] Hives   Acyclovir And Related Rash    Metabolic Disorder Labs: Lab Results  Component Value Date   HGBA1C 7.4 (H) 10/05/2023   MPG 166 10/05/2023   MPG 177.16 06/02/2021   No results found for: PROLACTIN Lab Results  Component Value Date   CHOL 119 12/14/2021   TRIG 86.0 12/14/2021   HDL 44.10 12/14/2021   CHOLHDL 3 12/14/2021   VLDL 17.2 12/14/2021   LDLCALC 57 12/14/2021   LDLCALC 63 03/14/2021   Lab Results  Component Value Date   TSH 1.860 06/30/2019   TSH 2.40 11/26/2018    Therapeutic Level Labs: No results found for: LITHIUM No results found for: VALPROATE No results found for: CBMZ  Current Medications: Current Outpatient Medications  Medication Sig Dispense Refill   ACCU-CHEK GUIDE test strip USE UP TO FOUR TIMES DAILY AS DIRECTED 200 strip 11   Accu-Chek Softclix Lancets lancets USE UP TO FOUR TIMES DAILY AS DIRECTED 300 each 0   albuterol  (PROVENTIL ) (2.5 MG/3ML) 0.083% nebulizer solution Take 3 mLs (2.5 mg total) by nebulization every 6 (six) hours as needed for wheezing or shortness of breath. 75 mL 0   albuterol  (VENTOLIN  HFA) 108 (90 Base) MCG/ACT inhaler Inhale 2 puffs into the lungs every 6 (six) hours as needed for wheezing or shortness of breath. 8 g 6   amphetamine -dextroamphetamine  (ADDERALL) 30 MG tablet Take 1 tablet by mouth 2 (two) times daily. 60 tablet 0   amphetamine -dextroamphetamine  (ADDERALL) 30 MG tablet Take 1 tablet by mouth 2 (two) times daily. 60 tablet 0   amphetamine -dextroamphetamine  (ADDERALL) 30 MG tablet Take 1 tablet by mouth 2 (two) times daily. 60 tablet 0   ARIPiprazole  (ABILIFY ) 5 MG tablet Take 1 tablet (5 mg total) by mouth daily. 90 tablet 1   atorvastatin  (LIPITOR) 40 MG tablet TAKE 0.5 TABLETS (20 MG TOTAL) BY MOUTH 2 (TWO) TIMES DAILY. 90 tablet  3   bismuth subsalicylate (PEPTO BISMOL) 262 MG/15ML suspension Take 30 mLs by mouth every 6 (six) hours as needed for indigestion.     blood glucose meter kit and supplies Dispense based on patient and insurance preference. Use up to four times daily as directed. (FOR ICD-10 E10.9, E11.9). 1 each 0   clonazePAM  (KLONOPIN ) 0.5 MG tablet Take 1 tablet (0.5 mg total) by mouth 2 (two) times daily as needed for anxiety. 60 tablet 2   DULoxetine  (CYMBALTA ) 60 MG capsule Take 1 capsule (60 mg total) by mouth in the morning. 90 capsule 2   fluticasone  (FLONASE ) 50 MCG/ACT nasal spray PLACE 1 SPRAY INTO BOTH NOSTRILS 2 (TWO) TIMES DAILY AS NEEDED FOR ALLERGIES OR RHINITIS (AFTER SINUS RINSE). 48 mL 2   ibuprofen  (ADVIL ) 200 MG tablet Take 600 mg by mouth every 6 (six) hours as needed for mild pain or headache.     Insulin  Pen  Needle (BD PEN NEEDLE NANO 2ND GEN) 32G X 4 MM MISC USE DAILY WITH VICTOZA  100 each 11   Insulin  Syringe-Needle U-100 (INSULIN  SYRINGE .5CC/31GX5/16) 31G X 5/16 0.5 ML MISC Inject 1 Units into the muscle daily. use as directed 100 each 2   ipratropium-albuterol  (DUONEB) 0.5-2.5 (3) MG/3ML SOLN Take 3 mLs by nebulization 3 (three) times daily. 360 mL 3   LANTUS  100 UNIT/ML injection INJECT 20 UNITS INTO THE SKIN DAILY WITH BREAKFAST 30 mL 3   losartan  (COZAAR ) 25 MG tablet TAKE 1 TABLET DAILY 90 tablet 3   meloxicam  (MOBIC ) 15 MG tablet Take 15 mg by mouth daily.     naproxen  (NAPROSYN ) 375 MG tablet Take 1 tablet (375 mg total) by mouth 2 (two) times daily. 20 tablet 0   ondansetron  (ZOFRAN -ODT) 4 MG disintegrating tablet Take 1 tablet (4 mg total) by mouth every 8 (eight) hours as needed. 20 tablet 2   pantoprazole  (PROTONIX ) 40 MG tablet TAKE 1 TABLET BY MOUTH TWICE A DAY 180 tablet 3   pregabalin  (LYRICA ) 150 MG capsule Take 1 capsule (150 mg total) by mouth 2 (two) times daily. 180 capsule 3   Semaglutide , 2 MG/DOSE, (OZEMPIC , 2 MG/DOSE,) 8 MG/3ML SOPN Inject 2 mg into the skin  once a week. 8 mL 1   umeclidinium-vilanterol (ANORO ELLIPTA ) 62.5-25 MCG/ACT AEPB Inhale 1 puff into the lungs daily. 30 each 11   No current facility-administered medications for this visit.     Musculoskeletal: Strength & Muscle Tone: within normal limits Gait & Station: normal Patient leans: N/A  Psychiatric Specialty Exam: Review of Systems  Musculoskeletal:  Positive for back pain.  All other systems reviewed and are negative.   Last menstrual period 07/24/2014.There is no height or weight on file to calculate BMI.  General Appearance: Casual and Fairly Groomed  Eye Contact:  Good  Speech:  Clear and Coherent  Volume:  Normal  Mood:  Euthymic  Affect:  Congruent  Thought Process:  Goal Directed  Orientation:  Full (Time, Place, and Person)  Thought Content: WDL   Suicidal Thoughts:  No  Homicidal Thoughts:  No  Memory:  Immediate;   Good Recent;   Good Remote;   Fair  Judgement:  Good  Insight:  Good  Psychomotor Activity:  Normal  Concentration:  Concentration: Good and Attention Span: Good  Recall:  Good  Fund of Knowledge: Good  Language: Good  Akathisia:  No  Handed:  Right  AIMS (if indicated): not done  Assets:  Communication Skills Desire for Improvement Resilience Social Support  ADL's:  Intact  Cognition: WNL  Sleep:  Good   Screenings: AIMS    Flowsheet Row Admission (Discharged) [facility]07/2019 [facility]  AIMS Total Score 0 0   AUDIT    Flowsheet Row Admission (Discharged) from 03/07/2019 in [facility] Admission (Discharged[facility]  Alcohol Use Disorder Identification Test Final Score (AUDIT) 0 0   GAD-7    Flowsheet Row Office Visit from 07/09/2023 in [facility] [place] [facility] [employer] Visit from 03/20/2022 in [facility]  [place] [facility] [employer] Visit from 03/14/2021 in [facility] [place] [facility] [employer] Visit from 10/20/2020 in [facility] [place] [facility] [employer] Visit from 11/06/2019 in [facility] [place] [facility]  Total GAD-7 Score 8  8 6 9 5    PHQ2-9    Flowsheet Row Office Visit from 07/09/2023 in [facility] Clinical Support from 02/09/2023 in [facility] [place] [facility] [employer] Visit from 12/20/2022 in [facility] [place] [facility] [employer] Visit from 09/19/2022 in [facility] [place] [facility] [employer] Visit from 03/20/2022 in [facility] at [employer]  PHQ-2 Total Score 6 0 0 0 3  PHQ-9 Total Score 18 2 1 2 9    Flowsheet Row UC from 06/15/2023 in [facility] ED to Hosp-Admission (Discharged) from 06/09/2022 in [facility] 5 EAST MEDICAL UNIT Video Visit from 03/03/2022 in [facility] Outpatient Behavioral [facility] at [facility] RISK CATEGORY No Risk No Risk No Risk     Assessment and Plan: This patient is a 58 year old female with a history depression anxiety and ADD.  She is doing well on her current regimen.  She will continue Cymbalta  60 mg daily for depression, clonazepam  0.5 mg twice daily as needed for anxiety, Abilify  5 mg daily for antidepressant augmentation and Adderall 30 mg twice daily for ADD.  She will return to see me in 3 months  Collaboration of Care: Collaboration of Care: Primary Care Provider AEB notes are shared with PCP on the epic system  Patient/Guardian was advised Release of Information must be obtained prior to any record release in order to collaborate their care with an outside provider. Patient/Guardian was advised if they have not already done so to contact the registration department to sign all  necessary forms in order for us  to release information regarding their care.   Consent: Patient/Guardian gives verbal consent for treatment and assignment of benefits for services provided during this visit. Patient/Guardian expressed understanding and agreed to proceed.    Barnie Gull, MD 11/23/2023, 10:10 AM

## 2023-11-29 ENCOUNTER — Encounter: Payer: Self-pay | Admitting: Physical Medicine and Rehabilitation

## 2023-11-29 ENCOUNTER — Ambulatory Visit: Admitting: Physical Medicine and Rehabilitation

## 2023-11-29 DIAGNOSIS — M545 Low back pain, unspecified: Secondary | ICD-10-CM

## 2023-11-29 DIAGNOSIS — G8929 Other chronic pain: Secondary | ICD-10-CM

## 2023-11-29 DIAGNOSIS — M47816 Spondylosis without myelopathy or radiculopathy, lumbar region: Secondary | ICD-10-CM | POA: Diagnosis not present

## 2023-11-29 DIAGNOSIS — M47817 Spondylosis without myelopathy or radiculopathy, lumbosacral region: Secondary | ICD-10-CM | POA: Diagnosis not present

## 2023-11-29 NOTE — Progress Notes (Signed)
 Pain Scale   Average Pain 5 Patient advising she has chronic back pain radiating to legs        +Driver, -BT, -Dye Allergies.

## 2023-11-29 NOTE — Progress Notes (Signed)
 [name] - 58 y.o. female MRN [id number]  Date of birth: Jan 14, 1966  Office Visit Note: Visit Date: 11/29/2023 PCP: Thedora Garnette HERO, MD Referred by: Thedora Garnette HERO, MD  Subjective: Chief Complaint  Patient presents with   Lower Back - Pain   HPI: [name] is a 58 y.o. female who comes in today for evaluation of chronic, worsening and severe bilateral lower back pain. Also reports intermittent tingling to bilateral legs and feet. Pain ongoing for several years. Her pain worsens with standing and walking. Her pain becomes severe with household tasks such as washing dishes and cleaning. Also reports pain when moving from sitting to standing position. She describes her pain as sore and aching 8 out of 10. Some relief of pain with home exercise regimen, rest and use of medications. Recent lumbar MRI imaging shows bilateral facet osteoarthritis at L3-L4 with small joint effusions. No high grade spinal canal stenosis. Patient denies focal weakness. No recent trauma or falls.   Patients course is complicated by COPD, acute respiratory failure with hypoxia, diabetes mellitus, diabetic peripheral neuropathy, tobacco use, drug abuse, depression and morbid obesity.       ROS Otherwise per HPI.  Assessment & Plan: Visit Diagnoses:    ICD-10-CM   1. Chronic bilateral low back pain without sciatica  M54.50 Ambulatory referral to Physical Medicine Rehab   G89.29     2. Spondylosis without myelopathy or radiculopathy, lumbosacral region  M47.817 Ambulatory referral to Physical Medicine Rehab    3. Facet arthropathy, lumbar  M47.816 Ambulatory referral to Physical Medicine Rehab       Plan: Findings:  Chronic, worsening and severe bilateral lower back pain. Intermittent paresthesias to bilateral legs and feet. Patient continues to have severe pain despite good conservative therapies such as home exercise regimen, rest and use of medications.  Patient's clinical  presentation and exam are consistent with facet mediated pain.  There is bilateral facet arthropathy with small joint effusions at the level of L3-L4 on recent lumbar MRI imaging.  She does have pain with lumbar extension on exam today.  We discussed treatment plan in detail today.  Next step is to perform diagnostic and hopefully therapeutic bilateral L3-L4 facet joint injections under fluoroscopic guidance. I discussed injection procedure in detail today. Dr. Eldonna also at bedside to answer any questions. At some point, I would also like her to re-group with short course of formal physical therapy. I do feel core strengthening and manual treatments would be beneficial for her. No red flag symptoms noted upon exam today.     Meds & Orders: No orders of the defined types were placed in this encounter.   Orders Placed This Encounter  Procedures   Ambulatory referral to Physical Medicine Rehab    Follow-up: Return for Bilateral L3-L4 facet joint injections.   Procedures: No procedures performed      Clinical History: Narrative & Impression CLINICAL DATA:  Low back pain, symptoms persists with greater than 6 weeks of treatment. Bilateral leg numbness which is progressively worsening.   EXAM: MRI LUMBAR SPINE WITHOUT CONTRAST   TECHNIQUE: Multiplanar, multisequence MR imaging of the lumbar spine was performed. No intravenous contrast was administered.   COMPARISON:  Radiography 10/30/2023   FINDINGS: Segmentation:  5 lumbar type vertebral bodies.   Alignment:  No significant malalignment.   Vertebrae: No fracture or focal bone lesion. No edematous endplate marrow changes or edematous facet arthritis.   Conus medullaris and cauda equina: Conus extends  to the L1 level. Conus and cauda equina appear normal.   Paraspinal and other soft tissues: Negative   Disc levels:   T12-L1: Normal   L1-2: Mild bulging of the disc.  No compressive stenosis.   L2-3: Normal   L3-4: No  disc abnormality. Bilateral facet osteoarthritis with small joint effusions. No compressive stenosis. The facet arthritis could be painful.   L4-5: Minimal bulging of the disc. Mild facet degeneration. No compressive stenosis.   L5-S1: Mild bulging of the disc. Mild facet osteoarthritis. No stenosis.   IMPRESSION: 1. No spinal stenosis or neural compression. 2. L3-4 facet osteoarthritis with small joint effusions. No compressive stenosis. The facet arthritis could be painful. 3. Mild, non-compressive disc bulges at L1-2, L4-5 and L5-S1. Mild facet osteoarthritis at L4-5 and L5-S1.     Electronically Signed   By: Oneil Officer M.D.   On: 11/16/2023 16:02   She reports that she has been smoking cigarettes. She started smoking about 45 years ago. She has a 68.4 pack-year smoking history. She has never used smokeless tobacco.  Recent Labs    03/30/23 1602 06/15/23 1837 07/09/23 1340 10/05/23 1532  HGBA1C 6.5*  --  7.0* 7.4*  LABURIC  --  4.8  --   --     Objective:  VS:  HT:    WT:   BMI:     BP:   HR: bpm  TEMP: ( )  RESP:  Physical Exam Vitals and nursing note reviewed.  HENT:     Head: Normocephalic and atraumatic.     Right Ear: External ear normal.     Left Ear: External ear normal.     Nose: Nose normal.     Mouth/Throat:     Mouth: Mucous membranes are moist.  Eyes:     Extraocular Movements: Extraocular movements intact.  Cardiovascular:     Rate and Rhythm: Normal rate.     Pulses: Normal pulses.  Pulmonary:     Effort: Pulmonary effort is normal.  Abdominal:     General: Abdomen is flat. There is no distension.  Musculoskeletal:        General: Tenderness present.     Cervical back: Normal range of motion.     Comments: Patient rises from seated position to standing without difficulty. Pain noted with facet loading and lumbar extension. 5/5 strength noted with bilateral hip flexion, knee flexion/extension, ankle dorsiflexion/plantarflexion and EHL. No  clonus noted bilaterally. No pain upon palpation of greater trochanters. No pain with internal/external rotation of bilateral hips. Sensation intact bilaterally. Negative slump test bilaterally. Ambulates without aid, gait steady.     Skin:    General: Skin is warm and dry.     Capillary Refill: Capillary refill takes less than 2 seconds.  Neurological:     General: No focal deficit present.     Mental Status: She is alert and oriented to person, place, and time.  Psychiatric:        Mood and Affect: Mood normal.        Behavior: Behavior normal.     Ortho Exam  Imaging: No results found.  Past Medical/Family/Surgical/Social History: Medications & Allergies reviewed per EMR, new medications updated. Patient Active Problem List   Diagnosis Date Noted   Positive colorectal cancer screening using Cologuard test 10/29/2023   Coronary atherosclerosis 05/23/2022   Aortic atherosclerosis (HCC) 05/23/2022   Arthritis of left knee 12/14/2021   Acute respiratory failure with hypoxia (HCC) 06/02/2021   Marijuana use  03/14/2021   Allergic rhinitis 03/14/2021   Angina pectoris (HCC) 09/08/2019   Benzodiazepine overdose 03/05/2019   Generalized muscle ache 09/24/2018   Chronic right ear pain 03/11/2018   Chronic sinusitis 03/11/2018   H/O drug abuse (HCC) 11/12/2017   Atypical chest pain 10/05/2017   COPD with chronic bronchitis (HCC) 10/01/2017   Mixed hyperlipidemia 10/01/2017   Tobacco use disorder 10/01/2017   Diabetic peripheral neuropathy (HCC) 10/01/2017   Iron deficiency anemia 10/01/2017   Adult attention deficit hyperactivity disorder 01/14/2016   Major depressive disorder, recurrent severe without psychotic features (HCC)    Leukocytosis 03/25/2014   OSA on CPAP 04/11/2013   Perforated ear drum, right 05/22/2012   Bell's palsy 01/23/2012   Carpal tunnel syndrome on both sides 01/11/2012   Vitamin D  deficiency 07/06/2011   Asthmatic bronchitis 06/11/2011   Low back pain  06/11/2011   Morbid obesity with body mass index (BMI) of 40.0 to 44.9 in adult [facility]) 05/06/2011   Gastric erosions 12/05/2010   Essential hypertension 12/02/2010   Type 2 diabetes mellitus with polyneuropathy (HCC) 12/02/2010   COPD with acute exacerbation (HCC) 11/30/2010   GERD (gastroesophageal reflux disease) 09/08/2010   Esophageal dysphagia 09/08/2010   Past Medical History:  Diagnosis Date   ADHD    Alcohol abuse    Anemia    Anxiety    Asthmatic bronchitis    normal PFT/ seen by pulmonary no evidence of COPD   Atypical chest pain 10/05/2017   Back pain    Bilateral swelling of feet    Chest pain    Chest pain on respiration 03/25/2014   Chronic bronchitis (HCC)    Chronic lower back pain    Chronic respiratory failure (HCC) 10/16/2011   Newly 02 dep 24/7 p discharge from [facility] 01/2013  - 03/13/2014  Walked RA  2 laps @ 185 ft each stopped due to  Sob/ aching in legs, thirsty/ no desat @ slow pace    Chronic respiratory failure with hypoxia (HCC)    On 2-3 L of oxygen  at home   Cigarette smoker 12/02/2010   Followed in Pulmonary clinic/ [facility]/ [place]   - Limits of effective care reviewed 12/22/2011     Complication of anesthesia    Constipation    COPD (chronic obstructive pulmonary disease) (HCC)    COPD with chronic bronchitis (HCC) 10/01/2017   Daily headache    Depression    Diabetic peripheral neuropathy (HCC)    DM (diabetes mellitus) type II controlled, neurological manifestation (HCC) 12/02/2010   Drug use    Gallbladder problem    Gastric erosions    EGD 08/2010.   GERD (gastroesophageal reflux disease)    Glaucoma    H/O drug abuse (HCC) 11/12/2017   -- scanned document from outside source: [facility] in [place]  which showed UDS positive for Adderall /amphetamine  usage as well as positive urine for THC.  This test result was collected 08/11/2016 and reported 10/07/2016. - lso review of the chart shows another  positive amphetamine , THC and METH in the urine back on 10/18/2015 under care everywhere. ---So    Heavy menses    High cholesterol    History of blood transfusion    related to low HgB (10/05/2017)   History of hiatal hernia    HTN (hypertension)    Hyperlipidemia associated with type 2 diabetes mellitus (HCC) 10/18/2015   Hypertension associated with diabetes (HCC) 12/02/2010   D/c acei 12/22/2011 due  to psuedowheeze and narcotic dependent cough> ? Improved - 03/14/2014 started bystolic in place of cozar due to cough     IBS (irritable bowel syndrome)    Increased urinary protein excretion    Internal hemorrhoids    Colonoscopy 5/12.   Iron deficiency anemia 10/01/2017   Joint pain    Mixed diabetic hyperlipidemia associated with type 2 diabetes mellitus (HCC) 10/01/2017   On home oxygen  therapy    5L at night (10/05/2017)   OSA on CPAP    Osteoarthritis    back (10/05/2017)   Oxygen  dependent 10/16/2011   Pneumonia    lots of times (10/05/2017)   PONV (postoperative nausea and vomiting)    Poorly controlled diabetes mellitus (HCC) 11/12/2017   Pulmonary infiltrates 12/22/2011   Followed in Pulmonary clinic/ [facility]/ [place]    - See CT Chest 05/05/11    Shortness of breath    Tachycardia    never had test done since no insurance   Tobacco use disorder-current smoker greater than 40-pack-year history- since age 89 1 ppd 10/01/2017   Type II diabetes mellitus (HCC)    Vitamin D  deficiency    Family History  Problem Relation Age of Onset   Heart attack Mother 13       deceased   Diabetes Mother    Breast cancer Mother 51       inflammatory breast ca   Heart failure Mother        oxygen  dependence, nonsmoker   Heart disease Mother    Depression Mother    Cancer Mother        Breast   Hypertension Mother    Hyperlipidemia Mother    Sudden death Mother    Sleep apnea Mother    Obesity Mother    Heart attack Father 60       deceased, etoh use   Heart disease  Father    Alcohol abuse Father    Depression Father    Heart failure Father    Sudden death Father    Ulcers Sister    Hypertension Sister    Heart failure Sister    Depression Sister    Anxiety disorder Sister    Liver disease Maternal Aunt 40       died while on liver transplant list   Liver disease Maternal Uncle    Cancer Paternal Aunt        Breast   Heart disease Maternal Grandmother    Heart attack Maternal Grandmother        premature CAD   Colon cancer Neg Hx    Past Surgical History:  Procedure Laterality Date   cataract surgery Left 03/18/2023   CESAREAN SECTION  1988; 1989   CHOLECYSTECTOMY OPEN  1990   COLONOSCOPY  09/16/2010   DOQ:wnmfjo colon/small internal hemorrhoids   ESOPHAGOGASTRODUODENOSCOPY  09/16/2010   SLF: normal/mild gastritis   ESOPHAGOGASTRODUODENOSCOPY N/A 10/19/2014   Procedure: ESOPHAGOGASTRODUODENOSCOPY (EGD);  Surgeon: Margo LITTIE Haddock, MD;  Location: [facility];  Service: Endoscopy;  Laterality: N/A;  830   FRACTURE SURGERY     HYSTEROSCOPY WITH THERMACHOICE  01/17/2012   Procedure: HYSTEROSCOPY WITH THERMACHOICE;  Surgeon: Vonn VEAR Inch, MD;  Location: [facility] ORS;  Service: Gynecology;  Laterality: N/A;  total therapy time: 9:13sec  D5W  18 ml in, D5W   18ml out, temperture 87degrees celcious   KIDNEY SURGERY     as child for blockages   LEFT HEART CATH AND CORONARY ANGIOGRAPHY N/A 09/08/2019  Procedure: LEFT HEART CATH AND CORONARY ANGIOGRAPHY;  Surgeon: [place], Peter M, MD;  Location: [facility] INVASIVE CV LAB;  Service: Cardiovascular;  Laterality: N/A;   TONSILLECTOMY     TUBAL LIGATION  1989   TYMPANOSTOMY TUBE PLACEMENT Bilateral    several times when I was a child   uterine ablation     WRIST FRACTURE SURGERY Left 1995   Social History   Occupational History   Occupation: unemployed  Tobacco Use   Smoking status: Every Day    Current packs/day: 1.50    Average packs/day: 1.5 packs/day for 45.6 years (68.4 ttl pk-yrs)    Types:  Cigarettes    Start date: 58   Smokeless tobacco: Never   Tobacco comments:    vape  Vaping Use   Vaping status: Former  Substance and Sexual Activity   Alcohol use: Not Currently   Drug use: Yes    Types: Marijuana    Comment: occ   Sexual activity: Not Currently    Partners: Male    Birth control/protection: Surgical    Comment: BTL, younger than 16, more than 5

## 2023-12-07 ENCOUNTER — Other Ambulatory Visit: Payer: Self-pay | Admitting: Family Medicine

## 2023-12-07 DIAGNOSIS — E1142 Type 2 diabetes mellitus with diabetic polyneuropathy: Secondary | ICD-10-CM

## 2023-12-21 ENCOUNTER — Other Ambulatory Visit: Payer: Self-pay

## 2023-12-21 ENCOUNTER — Ambulatory Visit: Admitting: Physical Medicine and Rehabilitation

## 2023-12-21 VITALS — BP 136/81 | HR 80

## 2023-12-21 DIAGNOSIS — M47816 Spondylosis without myelopathy or radiculopathy, lumbar region: Secondary | ICD-10-CM | POA: Diagnosis not present

## 2023-12-21 MED ORDER — METHYLPREDNISOLONE ACETATE 40 MG/ML IJ SUSP
40.0000 mg | Freq: Once | INTRAMUSCULAR | Status: AC
  Administered 2023-12-21: 40 mg

## 2023-12-21 NOTE — Progress Notes (Signed)
 Pain Scale   Average Pain 4 Patient advising she has lower back pain radiating to both legs at times, pain increases when standing and decreases when siting.        +Driver, -BT, -Dye Allergies.

## 2023-12-21 NOTE — Procedures (Signed)
 Lumbar Facet Joint Intra-Articular Injection(s) with Fluoroscopic Guidance  Patient: [name]      Date of Birth: 1965-10-18 MRN: [id number] PCP: Thedora Garnette HERO, MD      Visit Date: 12/21/2023   Universal Protocol:    Date/Time: 12/21/2023  Consent Given By: the patient  Position: PRONE   Additional Comments: Vital signs were monitored before and after the procedure. Patient was prepped and draped in the usual sterile fashion. The correct patient, procedure, and site was verified.   Injection Procedure Details:  Procedure Site One Meds Administered:  Meds ordered this encounter  Medications   methylPREDNISolone  acetate (DEPO-MEDROL ) injection 40 mg     Laterality: Bilateral  Location/Site:  L3-L4  Needle size: 22 guage  Needle type: Spinal  Needle Placement: Articular  Findings:  -Comments: Excellent flow of contrast producing a partial arthrogram.  Procedure Details: The fluoroscope beam is vertically oriented in AP, and the inferior recess is visualized beneath the lower pole of the inferior apophyseal process, which represents the target point for needle insertion. When direct visualization is difficult the target point is located at the medial projection of the vertebral pedicle. The region overlying each aforementioned target is locally anesthetized with a 1 to 2 ml. volume of 1% Lidocaine  without Epinephrine.   The spinal needle was inserted into each of the above mentioned facet joints using biplanar fluoroscopic guidance. A 0.25 to 0.5 ml. volume of Isovue -250 was injected and a partial facet joint arthrogram was obtained. A single spot film was obtained of the resulting arthrogram.    One to 1.25 ml of the steroid/anesthetic solution was then injected into each of the facet joints noted above.   Additional Comments:  The patient tolerated the procedure well Dressing: 2 x 2 sterile gauze and Band-Aid    Post-procedure details: Patient was  observed during the procedure. Post-procedure instructions were reviewed.  Patient left the clinic in stable condition.

## 2023-12-21 NOTE — Progress Notes (Signed)
 [name] - 58 y.o. female MRN [id number]  Date of birth: 1965-06-23  Office Visit Note: Visit Date: 12/21/2023 PCP: Thedora Garnette HERO, MD Referred by: Thedora Garnette HERO, MD  Subjective: Chief Complaint  Patient presents with   Lower Back - Pain   HPI:  [name] is a 58 y.o. female who comes in today at the request of Duwaine Pouch, FNP for planned Bilateral  L3-4 Lumbar facet/medial branch block with fluoroscopic guidance.  The patient has failed conservative care including home exercise, medications, time and activity modification.  This injection will be diagnostic and hopefully therapeutic.  Please see requesting physician notes for further details and justification.  Exam has shown concordant pain with facet joint loading.   ROS Otherwise per HPI.  Assessment & Plan: Visit Diagnoses:    ICD-10-CM   1. Spondylosis without myelopathy or radiculopathy, lumbar region  M47.816 XR C-ARM NO REPORT    Facet Injection    methylPREDNISolone  acetate (DEPO-MEDROL ) injection 40 mg      Plan: No additional findings.   Meds & Orders:  Meds ordered this encounter  Medications   methylPREDNISolone  acetate (DEPO-MEDROL ) injection 40 mg    Orders Placed This Encounter  Procedures   Facet Injection   XR C-ARM NO REPORT    Follow-up: Return for visit to requesting provider as needed.   Procedures: No procedures performed  Lumbar Facet Joint Intra-Articular Injection(s) with Fluoroscopic Guidance  Patient: [name]      Date of Birth: February 11, 1966 MRN: [id number] PCP: Thedora Garnette HERO, MD      Visit Date: 12/21/2023   Universal Protocol:    Date/Time: 12/21/2023  Consent Given By: the patient  Position: PRONE   Additional Comments: Vital signs were monitored before and after the procedure. Patient was prepped and draped in the usual sterile fashion. The correct patient, procedure, and site was verified.   Injection Procedure  Details:  Procedure Site One Meds Administered:  Meds ordered this encounter  Medications   methylPREDNISolone  acetate (DEPO-MEDROL ) injection 40 mg     Laterality: Bilateral  Location/Site:  L3-L4  Needle size: 22 guage  Needle type: Spinal  Needle Placement: Articular  Findings:  -Comments: Excellent flow of contrast producing a partial arthrogram.  Procedure Details: The fluoroscope beam is vertically oriented in AP, and the inferior recess is visualized beneath the lower pole of the inferior apophyseal process, which represents the target point for needle insertion. When direct visualization is difficult the target point is located at the medial projection of the vertebral pedicle. The region overlying each aforementioned target is locally anesthetized with a 1 to 2 ml. volume of 1% Lidocaine  without Epinephrine.   The spinal needle was inserted into each of the above mentioned facet joints using biplanar fluoroscopic guidance. A 0.25 to 0.5 ml. volume of Isovue -250 was injected and a partial facet joint arthrogram was obtained. A single spot film was obtained of the resulting arthrogram.    One to 1.25 ml of the steroid/anesthetic solution was then injected into each of the facet joints noted above.   Additional Comments:  The patient tolerated the procedure well Dressing: 2 x 2 sterile gauze and Band-Aid    Post-procedure details: Patient was observed during the procedure. Post-procedure instructions were reviewed.  Patient left the clinic in stable condition.    Clinical History: Narrative & Impression CLINICAL DATA:  Low back pain, symptoms persists with greater than 6 weeks of treatment. Bilateral leg numbness which is  progressively worsening.   EXAM: MRI LUMBAR SPINE WITHOUT CONTRAST   TECHNIQUE: Multiplanar, multisequence MR imaging of the lumbar spine was performed. No intravenous contrast was administered.   COMPARISON:  Radiography 10/30/2023    FINDINGS: Segmentation:  5 lumbar type vertebral bodies.   Alignment:  No significant malalignment.   Vertebrae: No fracture or focal bone lesion. No edematous endplate marrow changes or edematous facet arthritis.   Conus medullaris and cauda equina: Conus extends to the L1 level. Conus and cauda equina appear normal.   Paraspinal and other soft tissues: Negative   Disc levels:   T12-L1: Normal   L1-2: Mild bulging of the disc.  No compressive stenosis.   L2-3: Normal   L3-4: No disc abnormality. Bilateral facet osteoarthritis with small joint effusions. No compressive stenosis. The facet arthritis could be painful.   L4-5: Minimal bulging of the disc. Mild facet degeneration. No compressive stenosis.   L5-S1: Mild bulging of the disc. Mild facet osteoarthritis. No stenosis.   IMPRESSION: 1. No spinal stenosis or neural compression. 2. L3-4 facet osteoarthritis with small joint effusions. No compressive stenosis. The facet arthritis could be painful. 3. Mild, non-compressive disc bulges at L1-2, L4-5 and L5-S1. Mild facet osteoarthritis at L4-5 and L5-S1.     Electronically Signed   By: Oneil Officer M.D.   On: 11/16/2023 16:02     Objective:  VS:  HT:    WT:   BMI:     BP:136/81  HR:80bpm  TEMP: ( )  RESP:  Physical Exam Vitals and nursing note reviewed.  Constitutional:      General: She is not in acute distress.    Appearance: Normal appearance. She is obese. She is not ill-appearing.  HENT:     Head: Normocephalic and atraumatic.     Right Ear: External ear normal.     Left Ear: External ear normal.  Eyes:     Extraocular Movements: Extraocular movements intact.  Cardiovascular:     Rate and Rhythm: Normal rate.     Pulses: Normal pulses.  Pulmonary:     Effort: Pulmonary effort is normal. No respiratory distress.  Abdominal:     General: There is no distension.     Palpations: Abdomen is soft.  Musculoskeletal:        General: Tenderness  present.     Cervical back: Neck supple.     Right lower leg: No edema.     Left lower leg: No edema.     Comments: Patient has good distal strength with no pain over the greater trochanters.  No clonus or focal weakness.  Skin:    Findings: No erythema, lesion or rash.  Neurological:     General: No focal deficit present.     Mental Status: She is alert and oriented to person, place, and time.     Sensory: No sensory deficit.     Motor: No weakness or abnormal muscle tone.     Coordination: Coordination normal.  Psychiatric:        Mood and Affect: Mood normal.        Behavior: Behavior normal.      Imaging: XR C-ARM NO REPORT Result Date: 12/21/2023 Please see Notes tab for imaging impression.

## 2024-01-02 ENCOUNTER — Encounter: Payer: Self-pay | Admitting: Family Medicine

## 2024-01-02 DIAGNOSIS — E1142 Type 2 diabetes mellitus with diabetic polyneuropathy: Secondary | ICD-10-CM

## 2024-01-02 MED ORDER — OZEMPIC (2 MG/DOSE) 8 MG/3ML ~~LOC~~ SOPN: 2.0000 mg | PEN_INJECTOR | SUBCUTANEOUS | 3 refills | Status: DC

## 2024-01-14 ENCOUNTER — Ambulatory Visit: Admitting: Family Medicine

## 2024-01-14 ENCOUNTER — Encounter: Payer: Self-pay | Admitting: Family Medicine

## 2024-01-14 VITALS — BP 118/74 | HR 93 | Temp 97.0°F | Ht 64.0 in | Wt 253.2 lb

## 2024-01-14 DIAGNOSIS — Z23 Encounter for immunization: Secondary | ICD-10-CM

## 2024-01-14 DIAGNOSIS — Z794 Long term (current) use of insulin: Secondary | ICD-10-CM

## 2024-01-14 DIAGNOSIS — I10 Essential (primary) hypertension: Secondary | ICD-10-CM

## 2024-01-14 DIAGNOSIS — J069 Acute upper respiratory infection, unspecified: Secondary | ICD-10-CM | POA: Insufficient documentation

## 2024-01-14 DIAGNOSIS — E782 Mixed hyperlipidemia: Secondary | ICD-10-CM

## 2024-01-14 DIAGNOSIS — J4489 Other specified chronic obstructive pulmonary disease: Secondary | ICD-10-CM

## 2024-01-14 DIAGNOSIS — Z7985 Long-term (current) use of injectable non-insulin antidiabetic drugs: Secondary | ICD-10-CM | POA: Diagnosis not present

## 2024-01-14 DIAGNOSIS — E1142 Type 2 diabetes mellitus with diabetic polyneuropathy: Secondary | ICD-10-CM | POA: Diagnosis not present

## 2024-01-14 LAB — POCT INFLUENZA A/B
Influenza A, POC: NEGATIVE
Influenza B, POC: NEGATIVE

## 2024-01-14 LAB — POC COVID19 BINAXNOW: SARS Coronavirus 2 Ag: NEGATIVE

## 2024-01-14 MED ORDER — ALBUTEROL SULFATE (2.5 MG/3ML) 0.083% IN NEBU: 2.5000 mg | INHALATION_SOLUTION | Freq: Four times a day (QID) | RESPIRATORY_TRACT | 0 refills | Status: AC | PRN

## 2024-01-14 MED ORDER — BD PEN NEEDLE NANO 2ND GEN 32G X 4 MM MISC: 11 refills | Status: AC

## 2024-01-14 NOTE — Progress Notes (Signed)
 [facility] PRIMARY CARE [facility] [address] [place] [address] Dept: [phone] Dept Fax: [phone]  Chronic Care Office Visit  Subjective:    Patient ID: [name], female    DOB: 08-22-1965, 58 y.o..   MRN: [id number]  Chief Complaint  Patient presents with   Hypertension    3 month f/u HTN/DM.   C/o having HA, runny nose, cough x 3 days.  Has taken NyQuil.  Also having light headedness off/on x 1 month    History of Present Illness:  Patient is in today for reassessment of chronic medical issues.  Ms. Kasik has a history of Type 2 diabetes. She is managed on insulin  glargine (Lantus ) 20 units daily and semaglutide  (Ozempic ) 2 mg weekly. Her diabetes is complicated by peripheral neuropathy. This is managed with pregabalin  (Lyrica ) 150 mg bid.    Ms. Vialpando has a history of hypertension. She is managed on losartan  25 mg daily. She has noted lightheadedness with standing over the past month. She notes this caused her to fall at one point.   Ms. Clarey has a history of COPD with chronic bronchitis. She is managed on Anoro Ellipta  daily and PRN albuterol  (inhaler and nebulizer) and Duonebs. She continues to smoke about 1 ppd (down from 1 1/2 ppd). She had used bupropion  in the past, but developed hives. She failed at Chantix previously.   Ms. Brosious notes that for the past 3 days, she has had headache, nasal congestion with rhinorrhea, and cough productive of clear phlegm. She has been using Nyquil for symptom management.  Past Medical History: Patient Active Problem List   Diagnosis Date Noted   Positive colorectal cancer screening using Cologuard test 10/29/2023   Coronary atherosclerosis 05/23/2022   Aortic atherosclerosis 05/23/2022   Arthritis of left knee 12/14/2021   Marijuana use 03/14/2021   Allergic rhinitis 03/14/2021   Angina pectoris (HCC) 09/08/2019   Benzodiazepine overdose 03/05/2019   Generalized muscle  ache 09/24/2018   Chronic right ear pain 03/11/2018   Chronic sinusitis 03/11/2018   H/O drug abuse (HCC) 11/12/2017   Atypical chest pain 10/05/2017   COPD with chronic bronchitis (HCC) 10/01/2017   Mixed hyperlipidemia 10/01/2017   Tobacco use disorder 10/01/2017   Diabetic peripheral neuropathy (HCC) 10/01/2017   Iron deficiency anemia 10/01/2017   Adult attention deficit hyperactivity disorder 01/14/2016   Major depressive disorder, recurrent severe without psychotic features (HCC)    Leukocytosis 03/25/2014   OSA on CPAP 04/11/2013   Perforated ear drum, right 05/22/2012   Bell's palsy 01/23/2012   Carpal tunnel syndrome on both sides 01/11/2012   Vitamin D  deficiency 07/06/2011   Asthmatic bronchitis 06/11/2011   Low back pain 06/11/2011   Morbid obesity with body mass index (BMI) of 40.0 to 44.9 in adult [facility]) 05/06/2011   Gastric erosions 12/05/2010   Essential hypertension 12/02/2010   Type 2 diabetes mellitus with polyneuropathy (HCC) 12/02/2010   COPD with acute exacerbation (HCC) 11/30/2010   GERD (gastroesophageal reflux disease) 09/08/2010   Esophageal dysphagia 09/08/2010   Past Surgical History:  Procedure Laterality Date   cataract surgery Left 03/18/2023   CESAREAN SECTION  1988; 1989   CHOLECYSTECTOMY OPEN  1990   COLONOSCOPY  09/16/2010   DOQ:wnmfjo colon/small internal hemorrhoids   ESOPHAGOGASTRODUODENOSCOPY  09/16/2010   SLF: normal/mild gastritis   ESOPHAGOGASTRODUODENOSCOPY N/A 10/19/2014   Procedure: ESOPHAGOGASTRODUODENOSCOPY (EGD);  Surgeon: Margo LITTIE Haddock, MD;  Location: [facility] SUITE;  Service: Endoscopy;  Laterality: N/A;  830  FRACTURE SURGERY     HYSTEROSCOPY WITH THERMACHOICE  01/17/2012   Procedure: HYSTEROSCOPY WITH THERMACHOICE;  Surgeon: Vonn VEAR Inch, MD;  Location: [facility] ORS;  Service: Gynecology;  Laterality: N/A;  total therapy time: 9:13sec  D5W  18 ml in, D5W   18ml out, temperture 87degrees celcious   KIDNEY SURGERY     as child  for blockages   LEFT HEART CATH AND CORONARY ANGIOGRAPHY N/A 09/08/2019   Procedure: LEFT HEART CATH AND CORONARY ANGIOGRAPHY;  Surgeon: [place], Peter M, MD;  Location: [facility] INVASIVE CV LAB;  Service: Cardiovascular;  Laterality: N/A;   TONSILLECTOMY     TUBAL LIGATION  1989   TYMPANOSTOMY TUBE PLACEMENT Bilateral    several times when I was a child   uterine ablation     WRIST FRACTURE SURGERY Left 1995   Family History  Problem Relation Age of Onset   Heart attack Mother 26       deceased   Diabetes Mother    Breast cancer Mother 40       inflammatory breast ca   Heart failure Mother        oxygen  dependence, nonsmoker   Heart disease Mother    Depression Mother    Cancer Mother        Breast   Hypertension Mother    Hyperlipidemia Mother    Sudden death Mother    Sleep apnea Mother    Obesity Mother    Heart attack Father 62       deceased, etoh use   Heart disease Father    Alcohol abuse Father    Depression Father    Heart failure Father    Sudden death Father    Ulcers Sister    Hypertension Sister    Heart failure Sister    Depression Sister    Anxiety disorder Sister    Liver disease Maternal Aunt 40       died while on liver transplant list   Liver disease Maternal Uncle    Cancer Paternal Aunt        Breast   Heart disease Maternal Grandmother    Heart attack Maternal Grandmother        premature CAD   Colon cancer Neg Hx    Outpatient Medications Prior to Visit  Medication Sig Dispense Refill   ACCU-CHEK GUIDE test strip USE UP TO FOUR TIMES DAILY AS DIRECTED 200 strip 11   Accu-Chek Softclix Lancets lancets USE UP TO FOUR TIMES DAILY AS DIRECTED 300 each 0   albuterol  (VENTOLIN  HFA) 108 (90 Base) MCG/ACT inhaler Inhale 2 puffs into the lungs every 6 (six) hours as needed for wheezing or shortness of breath. 8 g 6   amphetamine -dextroamphetamine  (ADDERALL) 30 MG tablet Take 1 tablet by mouth 2 (two) times daily. 60 tablet 0    amphetamine -dextroamphetamine  (ADDERALL) 30 MG tablet Take 1 tablet by mouth 2 (two) times daily. 60 tablet 0   ARIPiprazole  (ABILIFY ) 5 MG tablet Take 1 tablet (5 mg total) by mouth daily. 90 tablet 1   atorvastatin  (LIPITOR) 40 MG tablet TAKE 0.5 TABLETS (20 MG TOTAL) BY MOUTH 2 (TWO) TIMES DAILY. 90 tablet 3   bismuth subsalicylate (PEPTO BISMOL) 262 MG/15ML suspension Take 30 mLs by mouth every 6 (six) hours as needed for indigestion.     clonazePAM  (KLONOPIN ) 0.5 MG tablet Take 1 tablet (0.5 mg total) by mouth 2 (two) times daily as needed for anxiety. 60 tablet 2   DULoxetine  (CYMBALTA )  60 MG capsule Take 1 capsule (60 mg total) by mouth in the morning. 90 capsule 2   fluticasone  (FLONASE ) 50 MCG/ACT nasal spray PLACE 1 SPRAY INTO BOTH NOSTRILS 2 (TWO) TIMES DAILY AS NEEDED FOR ALLERGIES OR RHINITIS (AFTER SINUS RINSE). 48 mL 2   ibuprofen  (ADVIL ) 200 MG tablet Take 600 mg by mouth every 6 (six) hours as needed for mild pain or headache.     insulin  glargine (LANTUS  SOLOSTAR) 100 UNIT/ML Solostar Pen Inject 20 Units into the skin daily. 15 mL PRN   Insulin  Syringe-Needle U-100 (INSULIN  SYRINGE .5CC/31GX5/16) 31G X 5/16 0.5 ML MISC Inject 1 Units into the muscle daily. use as directed 100 each 2   ipratropium-albuterol  (DUONEB) 0.5-2.5 (3) MG/3ML SOLN Take 3 mLs by nebulization 3 (three) times daily. 360 mL 3   losartan  (COZAAR ) 25 MG tablet TAKE 1 TABLET DAILY 90 tablet 3   meloxicam  (MOBIC ) 15 MG tablet Take 15 mg by mouth daily.     naproxen  (NAPROSYN ) 375 MG tablet Take 1 tablet (375 mg total) by mouth 2 (two) times daily. 20 tablet 0   ondansetron  (ZOFRAN -ODT) 4 MG disintegrating tablet Take 1 tablet (4 mg total) by mouth every 8 (eight) hours as needed. 20 tablet 2   pantoprazole  (PROTONIX ) 40 MG tablet TAKE 1 TABLET BY MOUTH TWICE A DAY 180 tablet 3   pregabalin  (LYRICA ) 150 MG capsule Take 1 capsule (150 mg total) by mouth 2 (two) times daily. 180 capsule 3   Semaglutide , 2 MG/DOSE,  (OZEMPIC , 2 MG/DOSE,) 8 MG/3ML SOPN Inject 2 mg into the skin once a week. 12 mL 3   umeclidinium-vilanterol (ANORO ELLIPTA ) 62.5-25 MCG/ACT AEPB Inhale 1 puff into the lungs daily. 30 each 11   albuterol  (PROVENTIL ) (2.5 MG/3ML) 0.083% nebulizer solution Take 3 mLs (2.5 mg total) by nebulization every 6 (six) hours as needed for wheezing or shortness of breath. 75 mL 0   Insulin  Pen Needle (BD PEN NEEDLE NANO 2ND GEN) 32G X 4 MM MISC USE DAILY WITH VICTOZA  100 each 11   blood glucose meter kit and supplies Dispense based on patient and insurance preference. Use up to four times daily as directed. (FOR ICD-10 E10.9, E11.9). 1 each 0   amphetamine -dextroamphetamine  (ADDERALL) 30 MG tablet Take 1 tablet by mouth 2 (two) times daily. 60 tablet 0   No facility-administered medications prior to visit.   Allergies  Allergen Reactions   Codeine  Itching and Nausea Only   Metformin  And Related Diarrhea, Nausea Only and Other (See Comments)    Stomach pain/nausea   Wellbutrin  [Bupropion  Hcl] Hives   Acyclovir And Related Rash   Objective:   Today's Vitals   01/14/24 1457  BP: 118/74  Pulse: 93  Temp: (!) 97 F (36.1 C)  TempSrc: Temporal  SpO2: 97%  Weight: 253 lb 3.2 oz (114.9 kg)  Height: 5' 4 (1.626 m)   Body mass index is 43.46 kg/m.   General: Well developed, well nourished. No acute distress. HEENT: Normocephalic, non-traumatic. External ears normal. EAC and TMs normal bilaterally. PERRL, EOMI. Conjunctiva clear.   Nose with moderate congestion and rhinorrhea. Mucous membranes moist. Oropharynx clear. Good dentition. Neck: Supple. No lymphadenopathy. No thyromegaly. Lungs: Mucousy breath sounds in both bases that generally clear with coughing. No wheezing, rales or rhonchi. CV: RRR without murmurs or rubs. Pulses 2+ bilaterally. Psych: Alert and oriented. Normal mood and affect.  Health Maintenance Due  Topic Date Due   Hepatitis B Vaccines 19-59 Average Risk (1  of 3 - 19+  3-dose series) Never done   Pneumococcal Vaccine: 50+ Years (2 of 2 - PCV) 02/19/2013   Zoster Vaccines- Shingrix (1 of 2) Never done   Diabetic kidney evaluation - Urine ACR  10/02/2018   OPHTHALMOLOGY EXAM  05/25/2021   Colonoscopy  08/23/2023   Medicare Annual Wellness (AWV)  02/09/2024   Cervical Cancer Screening (HPV/Pap Cotest)  02/24/2024   Lab Results POCT Covid: Neg. POCT Influenza A& B: Neg.    Assessment & Plan:   Problem List Items Addressed This Visit       Cardiovascular and Mediastinum   Essential hypertension - Primary   Blood pressure is at goal. Having some orthostatic symptoms. I recommend we do a trial off of her medicine.        Respiratory   COPD with chronic bronchitis (HCC)   Stable. Continue Anoro Ellipta  and albuterol  PRN.      Relevant Medications   albuterol  (PROVENTIL ) (2.5 MG/3ML) 0.083% nebulizer solution   Viral URI with cough   Discussed home care for viral illness, including rest, pushing fluids, and OTC medications as needed for symptom relief. Follow-up if needed for worsening or persistent symptoms.       Relevant Orders   POC COVID-19 BinaxNow (Completed)   POCT Influenza A/B (Completed)     Endocrine   Type 2 diabetes mellitus with polyneuropathy (HCC)   I will check her A1c today. Continue insulin  glargine 20 units daily and semaglutide  (Ozempic ) 2 mg weekly.       Relevant Orders   Glucose, random   Hemoglobin A1c     Other   Mixed hyperlipidemia   Lipids are at goal. Continue atorvastatin  40 mg daily.      Other Visit Diagnoses       Need for pneumococcal 20-valent conjugate vaccination       Relevant Orders   Pneumococcal conjugate vaccine 20-valent (Completed)       Return in about 3 months (around 04/14/2024) for Reassessment.   Garnette CHRISTELLA Simpler, MD

## 2024-01-14 NOTE — Assessment & Plan Note (Signed)
 Stable. Continue Anoro Ellipta  and albuterol  PRN.

## 2024-01-14 NOTE — Assessment & Plan Note (Signed)
 Discussed home care for viral illness, including rest, pushing fluids, and OTC medications as needed for symptom relief.  Follow-up if needed for worsening or persistent symptoms.

## 2024-01-14 NOTE — Assessment & Plan Note (Signed)
Lipids are at goal. Continue atorvastatin 40 mg daily.

## 2024-01-14 NOTE — Assessment & Plan Note (Signed)
 Blood pressure is at goal. Having some orthostatic symptoms. I recommend we do a trial off of her medicine.

## 2024-01-14 NOTE — Assessment & Plan Note (Signed)
 I will check her A1c today. Continue insulin  glargine 20 units daily and semaglutide  (Ozempic ) 2 mg weekly.

## 2024-01-15 ENCOUNTER — Ambulatory Visit: Payer: Self-pay | Admitting: Family Medicine

## 2024-01-15 LAB — GLUCOSE, RANDOM: Glucose, Bld: 133 mg/dL — ABNORMAL HIGH (ref 70–99)

## 2024-01-15 LAB — HEMOGLOBIN A1C: Hgb A1c MFr Bld: 7 % — ABNORMAL HIGH (ref 4.6–6.5)

## 2024-01-16 ENCOUNTER — Encounter: Payer: Self-pay | Admitting: Physician Assistant

## 2024-01-21 NOTE — Telephone Encounter (Signed)
 error

## 2024-02-08 ENCOUNTER — Other Ambulatory Visit: Payer: Self-pay | Admitting: Family

## 2024-02-08 DIAGNOSIS — M5442 Lumbago with sciatica, left side: Secondary | ICD-10-CM

## 2024-02-08 DIAGNOSIS — M25511 Pain in right shoulder: Secondary | ICD-10-CM

## 2024-02-25 ENCOUNTER — Ambulatory Visit: Payer: Self-pay

## 2024-02-25 ENCOUNTER — Encounter (HOSPITAL_COMMUNITY): Payer: Self-pay | Admitting: Psychiatry

## 2024-02-25 ENCOUNTER — Encounter: Payer: Self-pay | Admitting: Radiology

## 2024-02-25 ENCOUNTER — Telehealth (HOSPITAL_COMMUNITY): Admitting: Psychiatry

## 2024-02-25 DIAGNOSIS — F331 Major depressive disorder, recurrent, moderate: Secondary | ICD-10-CM

## 2024-02-25 DIAGNOSIS — F901 Attention-deficit hyperactivity disorder, predominantly hyperactive type: Secondary | ICD-10-CM

## 2024-02-25 MED ORDER — ARIPIPRAZOLE 5 MG PO TABS: 5.0000 mg | ORAL_TABLET | Freq: Every day | ORAL | 1 refills | Status: DC

## 2024-02-25 MED ORDER — DULOXETINE HCL 60 MG PO CPEP: 60.0000 mg | ORAL_CAPSULE | Freq: Every morning | ORAL | 2 refills | Status: DC

## 2024-02-25 MED ORDER — CLONAZEPAM 0.5 MG PO TABS: 0.5000 mg | ORAL_TABLET | Freq: Two times a day (BID) | ORAL | 2 refills | Status: DC | PRN

## 2024-02-25 MED ORDER — AMPHETAMINE-DEXTROAMPHETAMINE 30 MG PO TABS: 30.0000 mg | ORAL_TABLET | Freq: Two times a day (BID) | ORAL | 0 refills | Status: DC

## 2024-02-25 NOTE — Telephone Encounter (Signed)
 FYI Only or Action Required?: Action required by provider: request for appointment.  Patient was last seen in primary care on 01/14/2024 by Thedora Garnette HERO, MD.  Called Nurse Triage reporting Cough.  Symptoms began several weeks ago.  Interventions attempted: Rest, hydration, or home remedies.  Symptoms are: gradually worsening. Productive cough, wheezing,SOB.   Triage Disposition: See Physician Within 24 Hours  Patient/caregiver understands and will follow disposition?: Yes   Copied from CRM #[id number]. Topic: Clinical - Red Word Triage >> Feb 25, 2024  1:18 PM Paige D wrote: Red Word that prompted transfer to Nurse Triage:  cold, cough, congestion, possible ear infection draining  from both ears, coughing up yellow phlegm cough has also gotten much worse and into [place] chest area Answer Assessment - Initial Assessment Questions 1. ONSET: When did Sheila cough begin?      2 weeks ago 2. SEVERITY: How bad is [place]ila cough today?      severe 3. SPUTUM: Describe [place] color of your sputum (e.g., none, dry cough; clear, white, yellow, green)     yellow 4. HEMOPTYSIS: Are you coughing up any blood? If Yes, ask: How much? (e.g., flecks, streaks, tablespoons, etc.)     no 5. DIFFICULTY BREATHING: Are you having difficulty breathing? If Yes, ask: How bad is it? (e.g., mild, moderate, severe)      mild 6. FEVER: Do you have a fever? If Yes, ask: What is your temperature, how was it measured, and when did it start?     no 7. CARDIAC HISTORY: Do you have any history of heart disease? (e.g., heart attack, congestive heart failure)      NO 8. LUNG HISTORY: Do you have any history of lung disease?  (e.g., pulmonary embolus, asthma, emphysema)     COPD 9. PE RISK FACTORS: Do you have a history of blood clots? (or: recent major surgery, recent prolonged travel, bedridden)     no 10. OTHER SYMPTOMS: Do you have any other symptoms? (e.g., runny nose, wheezing, chest pain)        Wheezing, ears hurt 11. PREGNANCY: Is there any chance you are pregnant? When was your last menstrual period?       no 12. TRAVEL: Have you traveled out of [place] country in [place] last month? (e.g., travel history, exposures)       no  Protocols used: Cough - Acute Productive-A-AH  Reason for Disposition  [1] Continuous (nonstop) coughing interferes with work or school AND [2] no improvement using cough treatment per Care Advice  Answer Assessment - Initial Assessment Questions 1. ONSET: When did Sheila cough begin?      2 weeks ago 2. SEVERITY: How bad is [place] cough today?      severe 3. SPUTUM: Describe [place] color of your sputum (e.g., none, dry cough; clear, white, yellow, green)     yellow 4. HEMOPTYSIS: Are you coughing up any blood? If Yes, ask: How much? (e.g., flecks, streaks, tablespoons, etc.)     no 5. DIFFICULTY BREATHING: Are you having difficulty breathing? If Yes, ask: How bad is it? (e.g., mild, moderate, severe)      mild 6. FEVER: Do you have a fever? If Yes, ask: What is your temperature, how was it measured, and when did it start?     no 7. CARDIAC HISTORY: Do you have any history of heart disease? (e.g., heart attack, congestive heart failure)      NO 8. LUNG HISTORY: Do you have any history of lung disease?  (  e.g., pulmonary embolus, asthma, emphysema)     COPD 9. PE RISK FACTORS: Do you have a history of blood clots? (or: recent major surgery, recent prolonged travel, bedridden)     no 10. OTHER SYMPTOMS: Do you have any other symptoms? (e.g., runny nose, wheezing, chest pain)       Wheezing, ears hurt 11. PREGNANCY: Is there any chance you are pregnant? When was your last menstrual period?       no 12. TRAVEL: Have you traveled out of [place] country in [place] last month? (e.g., travel history, exposures)       no  Protocols used: Cough - Acute Productive-A-AH

## 2024-02-25 NOTE — Progress Notes (Signed)
 Virtual Visit via Video Note  I connected with Mildreth Meliton Lunger on 02/25/24 at  1:00 PM EST by a video enabled telemedicine application and verified that I am speaking with the correct person using two identifiers.  Location: Patient: home Provider: office   I discussed the limitations of evaluation and management by telemedicine and the availability of in person appointments. The patient expressed understanding and agreed to proceed.     I discussed the assessment and treatment plan with the patient. The patient was provided an opportunity to ask questions and all were answered. The patient agreed with the plan and demonstrated an understanding of the instructions.   The patient was advised to call back or seek an in-person evaluation if the symptoms worsen or if the condition fails to improve as anticipated.  I provided 20 minutes of non-face-to-face time during this encounter.   Barnie Gull, MD  [facility] MD/PA/NP OP Progress Note  02/25/2024 1:13 PM Tahiri Adonai Helzer  MRN:  [id number]  Chief Complaint:  Chief Complaint  Patient presents with   Depression   Anxiety   ADD   Follow-up   HPI: This patient is a 58 year old widowed white female who lives with her daughter and 2 grandsons in [place]. She is on disability.   The patient returns for follow-up after 3 months regarding her major depression generalized anxiety and ADD without hyperactivity.  She states that she has had a bad cold lately which may be progressing to an ear infection.  She is going to see her PCP.  Other than that her health has been fairly good.  The patient denies significant depression anxiety thoughts of self-harm or suicide.  She is generally sleeping well and focusing well with the Adderall Visit Diagnosis:    ICD-10-CM   1. Moderate episode of recurrent major depressive disorder (HCC)  F33.1     2. Attention deficit hyperactivity disorder (ADHD), predominantly hyperactive type  F90.1        Past Psychiatric History:The patient was hospitalized twice in her 35s and again in 2006 and 2020 for drug overdose with suicidal intent    Past Medical History:  Past Medical History:  Diagnosis Date   ADHD    Alcohol abuse    Anemia    Anxiety    Asthmatic bronchitis    normal PFT/ seen by pulmonary no evidence of COPD   Atypical chest pain 10/05/2017   Back pain    Bilateral swelling of feet    Chest pain    Chest pain on respiration 03/25/2014   Chronic bronchitis (HCC)    Chronic lower back pain    Chronic respiratory failure (HCC) 10/16/2011   Newly 02 dep 24/7 p discharge from [facility] 01/2013  - 03/13/2014  Walked RA  2 laps @ 185 ft each stopped due to  Sob/ aching in legs, thirsty/ no desat @ slow pace    Chronic respiratory failure with hypoxia (HCC)    On 2-3 L of oxygen  at home   Cigarette smoker 12/02/2010   Followed in Pulmonary clinic/ [facility]/ [place]   - Limits of effective care reviewed 12/22/2011     Complication of anesthesia    Constipation    COPD (chronic obstructive pulmonary disease) (HCC)    COPD with chronic bronchitis (HCC) 10/01/2017   Daily headache    Depression    Diabetic peripheral neuropathy (HCC)    DM (diabetes mellitus) type II controlled, neurological manifestation (HCC) 12/02/2010   Drug use  Gallbladder problem    Gastric erosions    EGD 08/2010.   GERD (gastroesophageal reflux disease)    Glaucoma    H/O drug abuse (HCC) 11/12/2017   -- scanned document from outside source: [facility] in [place]  which showed UDS positive for Adderall /amphetamine  usage as well as positive urine for THC.  This test result was collected 08/11/2016 and reported 10/07/2016. - lso review of the chart shows another positive amphetamine , THC and METH in the urine back on 10/18/2015 under care everywhere. ---So    Heavy menses    High cholesterol    History of blood transfusion    related to low HgB  (10/05/2017)   History of hiatal hernia    HTN (hypertension)    Hyperlipidemia associated with type 2 diabetes mellitus (HCC) 10/18/2015   Hypertension associated with diabetes (HCC) 12/02/2010   D/c acei 12/22/2011 due to psuedowheeze and narcotic dependent cough> ? Improved - 03/14/2014 started bystolic in place of cozar due to cough     IBS (irritable bowel syndrome)    Increased urinary protein excretion    Internal hemorrhoids    Colonoscopy 5/12.   Iron deficiency anemia 10/01/2017   Joint pain    Mixed diabetic hyperlipidemia associated with type 2 diabetes mellitus (HCC) 10/01/2017   On home oxygen  therapy    5L at night (10/05/2017)   OSA on CPAP    Osteoarthritis    back (10/05/2017)   Oxygen  dependent 10/16/2011   Pneumonia    lots of times (10/05/2017)   PONV (postoperative nausea and vomiting)    Poorly controlled diabetes mellitus (HCC) 11/12/2017   Pulmonary infiltrates 12/22/2011   Followed in Pulmonary clinic/ [facility]/ [place]    - See CT Chest 05/05/11    Shortness of breath    Tachycardia    never had test done since no insurance   Tobacco use disorder-current smoker greater than 40-pack-year history- since age 38 1 ppd 10/01/2017   Type II diabetes mellitus (HCC)    Vitamin D  deficiency     Past Surgical History:  Procedure Laterality Date   cataract surgery Left 03/18/2023   CESAREAN SECTION  1988; 1989   CHOLECYSTECTOMY OPEN  1990   COLONOSCOPY  09/16/2010   DOQ:wnmfjo colon/small internal hemorrhoids   ESOPHAGOGASTRODUODENOSCOPY  09/16/2010   SLF: normal/mild gastritis   ESOPHAGOGASTRODUODENOSCOPY N/A 10/19/2014   Procedure: ESOPHAGOGASTRODUODENOSCOPY (EGD);  Surgeon: Margo LITTIE Haddock, MD;  Location: [facility]ITE;  Service: Endoscopy;  Laterality: N/A;  830   FRACTURE SURGERY     HYSTEROSCOPY WITH THERMACHOICE  01/17/2012   Procedure: HYSTEROSCOPY WITH THERMACHOICE;  Surgeon: Vonn VEAR Inch, MD;  Location: [facility] ORS;  Service: Gynecology;  Laterality:  N/A;  total therapy time: 9:13sec  D5W  18 ml in, D5W   18ml out, temperture 87degrees celcious   KIDNEY SURGERY     as child for blockages   LEFT HEART CATH AND CORONARY ANGIOGRAPHY N/A 09/08/2019   Procedure: LEFT HEART CATH AND CORONARY ANGIOGRAPHY;  Surgeon: Jordan, Peter M, MD;  Location[facility] INVASIVE CV LAB;  Service: Cardiovascular;  Laterality: N/A;   TONSILLECTOMY     TUBAL LIGATION  1989   TYMPANOSTOMY TUBE PLACEMENT Bilateral    several times when I was a child   uterine ablation     WRIST FRACTURE SURGERY Left 1995    Family Psychiatric History: See below  Family History:  Family History  Problem Relation Age of Onset  Heart attack Mother 60       deceased   Diabetes Mother    Breast cancer Mother 55       inflammatory breast ca   Heart failure Mother        oxygen  dependence, nonsmoker   Heart disease Mother    Depression Mother    Cancer Mother        Breast   Hypertension Mother    Hyperlipidemia Mother    Sudden death Mother    Sleep apnea Mother    Obesity Mother    Heart attack Father 46       deceased, etoh use   Heart disease Father    Alcohol abuse Father    Depression Father    Heart failure Father    Sudden death Father    Ulcers Sister    Hypertension Sister    Heart failure Sister    Depression Sister    Anxiety disorder Sister    Liver disease Maternal Aunt 40       died while on liver transplant list   Liver disease Maternal Uncle    Cancer Paternal Aunt        Breast   Heart disease Maternal Grandmother    Heart attack Maternal Grandmother        premature CAD   Colon cancer Neg Hx     Social History:  Social History   Socioeconomic History   Marital status: Married    Spouse name: Not on file   Number of children: 2   Years of education: Not on file   Highest education level: Some college, no degree  Occupational History   Occupation: unemployed  Tobacco Use   Smoking status: Every Day    Current packs/day: 1.50     Average packs/day: 1.5 packs/day for 45.8 years (68.8 ttl pk-yrs)    Types: Cigarettes    Start date: 1980   Smokeless tobacco: Never   Tobacco comments:    vape  Vaping Use   Vaping status: Former  Substance and Sexual Activity   Alcohol use: Not Currently   Drug use: Yes    Types: Marijuana    Comment: occ   Sexual activity: Not Currently    Partners: Male    Birth control/protection: Surgical    Comment: BTL, younger than 16, more than 5  Other Topics Concern   Not on file  Social History Narrative   Has 2 step children.   Lives in multi-family home with children and grandchildren.   On disability.   Social Drivers of Health   Financial Resource Strain: Medium Risk (01/13/2024)   Overall Financial Resource Strain (CARDIA)    Difficulty of Paying Living Expenses: Somewhat hard  Food Insecurity: Food Insecurity Present (01/13/2024)   Hunger Vital Sign    Worried About Running Out of Food in the Last Year: Sometimes true    Ran Out of Food in the Last Year: Sometimes true  Transportation Needs: No Transportation Needs (01/13/2024)   PRAPARE - Administrator, Civil Service (Medical): No    Lack of Transportation (Non-Medical): No  Physical Activity: Insufficiently Active (01/13/2024)   Exercise Vital Sign    Days of Exercise per Week: 1 day    Minutes of Exercise per Session: 20 min  Stress: Stress Concern Present (01/13/2024)   [employer] of Occupational Health - Occupational Stress Questionnaire    Feeling of Stress: To some extent  Social Connections: Moderately Integrated (01/13/2024)  Social Advertising Account Executive    Frequency of Communication with Friends and Family: Twice a week    Frequency of Social Gatherings with Friends and Family: Once a week    Attends Religious Services: More than 4 times per year    Active Member of [employer] or Organizations: Yes    Attends Banker Meetings: More than 4 times per year    Marital Status:  Widowed    Allergies:  Allergies  Allergen Reactions   Codeine  Itching and Nausea Only   Metformin  And Related Diarrhea, Nausea Only and Other (See Comments)    Stomach pain/nausea   Wellbutrin  [Bupropion  Hcl] Hives   Acyclovir And Related Rash    Metabolic Disorder Labs: Lab Results  Component Value Date   HGBA1C 7.0 (H) 01/14/2024   MPG 166 10/05/2023   MPG 177.16 06/02/2021   No results found for: PROLACTIN Lab Results  Component Value Date   CHOL 119 12/14/2021   TRIG 86.0 12/14/2021   HDL 44.10 12/14/2021   CHOLHDL 3 12/14/2021   VLDL 17.2 12/14/2021   LDLCALC 57 12/14/2021   LDLCALC 63 03/14/2021   Lab Results  Component Value Date   TSH 1.860 06/30/2019   TSH 2.40 11/26/2018    Therapeutic Level Labs: No results found for: LITHIUM No results found for: VALPROATE No results found for: CBMZ  Current Medications: Current Outpatient Medications  Medication Sig Dispense Refill   amphetamine -dextroamphetamine  (ADDERALL) 30 MG tablet Take 1 tablet by mouth 2 (two) times daily. 60 tablet 0   ACCU-CHEK GUIDE test strip USE UP TO FOUR TIMES DAILY AS DIRECTED 200 strip 11   Accu-Chek Softclix Lancets lancets USE UP TO FOUR TIMES DAILY AS DIRECTED 300 each 0   albuterol  (PROVENTIL ) (2.5 MG/3ML) 0.083% nebulizer solution Take 3 mLs (2.5 mg total) by nebulization every 6 (six) hours as needed for wheezing or shortness of breath. 75 mL 0   albuterol  (VENTOLIN  HFA) 108 (90 Base) MCG/ACT inhaler Inhale 2 puffs into the lungs every 6 (six) hours as needed for wheezing or shortness of breath. 8 g 6   amphetamine -dextroamphetamine  (ADDERALL) 30 MG tablet Take 1 tablet by mouth 2 (two) times daily. 60 tablet 0   amphetamine -dextroamphetamine  (ADDERALL) 30 MG tablet Take 1 tablet by mouth 2 (two) times daily. 60 tablet 0   ARIPiprazole  (ABILIFY ) 5 MG tablet Take 1 tablet (5 mg total) by mouth daily. 90 tablet 1   atorvastatin  (LIPITOR) 40 MG tablet TAKE 0.5 TABLETS (20  MG TOTAL) BY MOUTH 2 (TWO) TIMES DAILY. 90 tablet 3   bismuth subsalicylate (PEPTO BISMOL) 262 MG/15ML suspension Take 30 mLs by mouth every 6 (six) hours as needed for indigestion.     blood glucose meter kit and supplies Dispense based on patient and insurance preference. Use up to four times daily as directed. (FOR ICD-10 E10.9, E11.9). 1 each 0   clonazePAM  (KLONOPIN ) 0.5 MG tablet Take 1 tablet (0.5 mg total) by mouth 2 (two) times daily as needed for anxiety. 60 tablet 2   DULoxetine  (CYMBALTA ) 60 MG capsule Take 1 capsule (60 mg total) by mouth in the morning. 90 capsule 2   fluticasone  (FLONASE ) 50 MCG/ACT nasal spray PLACE 1 SPRAY INTO BOTH NOSTRILS 2 (TWO) TIMES DAILY AS NEEDED FOR ALLERGIES OR RHINITIS (AFTER SINUS RINSE). 48 mL 2   ibuprofen  (ADVIL ) 200 MG tablet Take 600 mg by mouth every 6 (six) hours as needed for mild pain or headache.  insulin  glargine (LANTUS  SOLOSTAR) 100 UNIT/ML Solostar Pen Inject 20 Units into the skin daily. 15 mL PRN   Insulin  Pen Needle (BD PEN NEEDLE NANO 2ND GEN) 32G X 4 MM MISC USE DAILY WITH VICTOZA  100 each 11   Insulin  Syringe-Needle U-100 (INSULIN  SYRINGE .5CC/31GX5/16) 31G X 5/16 0.5 ML MISC Inject 1 Units into the muscle daily. use as directed 100 each 2   ipratropium-albuterol  (DUONEB) 0.5-2.5 (3) MG/3ML SOLN Take 3 mLs by nebulization 3 (three) times daily. 360 mL 3   meloxicam  (MOBIC ) 15 MG tablet Take 15 mg by mouth daily.     naproxen  (NAPROSYN ) 375 MG tablet Take 1 tablet (375 mg total) by mouth 2 (two) times daily. 20 tablet 0   ondansetron  (ZOFRAN -ODT) 4 MG disintegrating tablet Take 1 tablet (4 mg total) by mouth every 8 (eight) hours as needed. 20 tablet 2   pantoprazole  (PROTONIX ) 40 MG tablet TAKE 1 TABLET BY MOUTH TWICE A DAY 180 tablet 3   pregabalin  (LYRICA ) 150 MG capsule Take 1 capsule (150 mg total) by mouth 2 (two) times daily. 180 capsule 3   Semaglutide , 2 MG/DOSE, (OZEMPIC , 2 MG/DOSE,) 8 MG/3ML SOPN Inject 2 mg into the  skin once a week. 12 mL 3   umeclidinium-vilanterol (ANORO ELLIPTA ) 62.5-25 MCG/ACT AEPB Inhale 1 puff into the lungs daily. 30 each 11   No current facility-administered medications for this visit.     Musculoskeletal: Strength & Muscle Tone: within normal limits Gait & Station: normal Patient leans: N/A  Psychiatric Specialty Exam: Review of Systems  HENT:  Positive for congestion and ear pain.   Respiratory:  Positive for cough.   All other systems reviewed and are negative.   Last menstrual period 07/24/2014.There is no height or weight on file to calculate BMI.  General Appearance: Casual and Fairly Groomed  Eye Contact:  Good  Speech:  Clear and Coherent  Volume:  Normal  Mood:  Euthymic  Affect:  Congruent  Thought Process:  Goal Directed  Orientation:  Full (Time, Place, and Person)  Thought Content: WDL   Suicidal Thoughts:  No  Homicidal Thoughts:  No  Memory:  Immediate;   Good Recent;   Good Remote;   NA  Judgement:  Good  Insight:  Good  Psychomotor Activity:  Normal  Concentration:  Concentration: Good and Attention Span: Good  Recall:  Good  Fund of Knowledge: Good  Language: Good  Akathisia:  No  Handed:  Right  AIMS (if indicated): not done  Assets:  Communication Skills Desire for Improvement Resilience Social Support Talents/Skills  ADL's:  Intact  Cognition: WNL  Sleep:  Good   Screenings: AIMS    Flowsheet Row Admission (Discharged) [facility] 03/07/2019 in [facility]PAT[facility]INE [facility]  AIMS Total Score 0 0   AUDIT    Flowsheet Row Admission (Discharged) from 03/07/2019 in [facility]IENT [facility]AL MEDICINE Admission (Discharged) [facility]  Alcohol Use Disorder Identification Test Final Score (AUDIT) 0 0   GAD-7    Flowsheet Row Office Visit from 01/14/2024 in [facility]  [employer] Visit from 07/09/2023 in [facility] [facility] [employer] Visit from 03/20/2022 in [facility] [employer] Visit from 03/14/2021 in [facility] [place] [facility] [employer] Visit from 10/20/2020 in [facility] [place] [facility]  Total GAD-7 Score  3 8 8 6 9    PHQ2-9    Flowsheet Row Office Visit from 01/14/2024 in [facility] Visit from 07/09/2023 in [facility] [place] [facility] [employer] Clinical Support from 02/09/2023 in [facility] [place] [facility] [employer] Visit from 12/20/2022 in [facility] [place] [facility] [employer] Visit from 09/19/2022 in [facility] at [employer]  PHQ-2 Total Score 1 6 0 0 0  PHQ-9 Total Score 6 18 2 1 2    Flowsheet Row UC from 06/15/2023 in [facility]) ED to Hosp-Admission (Discharged) from 06/09/2022 in [facility] E[facility] MEDICAL UNIT Video Visit from 03/03/2022 in [facility] Out[facility] at [facility] RISK CATEGORY No Risk No Risk No Risk     Assessment and Plan: This patient is a 58 year old female with a history of major depression, generalized anxiety disorder and inattentive ADHD.  She continues to do well on her current regimen.  She will continue Cymbalta  60 mg daily for depression, Abilify  5 mg daily for antidepressant augmentation, clonazepam  0.5 mg twice daily as needed for anxiety and Adderall 30 mg twice daily for ADD.  She will return to see me in 3 months  Collaboration of Care: Collaboration of Care: Primary Care Provider AEB notes are shared with PCP on the epic system  Patient/Guardian was advised Release of Information must be obtained prior to any record release in order to collaborate their care with an outside provider.  Patient/Guardian was advised if they have not already done so to contact the registration department to sign all necessary forms in order for us  to release information regarding their care.   Consent: Patient/Guardian gives verbal consent for treatment and assignment of benefits for services provided during this visit. Patient/Guardian expressed understanding and agreed to proceed.    Barnie Gull, MD 02/25/2024, 1:13 PM

## 2024-02-26 ENCOUNTER — Ambulatory Visit: Admitting: Internal Medicine

## 2024-02-26 ENCOUNTER — Encounter: Payer: Self-pay | Admitting: Internal Medicine

## 2024-02-26 VITALS — BP 118/74 | HR 86 | Temp 97.8°F | Ht 64.0 in | Wt 262.4 lb

## 2024-02-26 DIAGNOSIS — J440 Chronic obstructive pulmonary disease with acute lower respiratory infection: Secondary | ICD-10-CM

## 2024-02-26 DIAGNOSIS — J0140 Acute pansinusitis, unspecified: Secondary | ICD-10-CM

## 2024-02-26 DIAGNOSIS — J209 Acute bronchitis, unspecified: Secondary | ICD-10-CM | POA: Diagnosis not present

## 2024-02-26 MED ORDER — IPRATROPIUM-ALBUTEROL 0.5-2.5 (3) MG/3ML IN SOLN: 3.0000 mL | Freq: Once | RESPIRATORY_TRACT | Status: DC

## 2024-02-26 MED ORDER — AMOXICILLIN-POT CLAVULANATE 875-125 MG PO TABS: 1.0000 | ORAL_TABLET | Freq: Two times a day (BID) | ORAL | 0 refills | Status: AC

## 2024-02-26 MED ORDER — PREDNISONE 20 MG PO TABS: 40.0000 mg | ORAL_TABLET | Freq: Every day | ORAL | 0 refills | Status: AC

## 2024-02-26 NOTE — Patient Instructions (Signed)
 Continue Mucinex   Take antibiotic as prescribed - take after eating to avoid upset stomach  Take prednisone  as prescribed  Do breathing treatments every 4-6 hours as needed for wheezing or shortness of breath

## 2024-02-26 NOTE — Progress Notes (Signed)
 [facility] PRIMARY CARE [facility] [address] [place] [address] Dept: [phone] Dept Fax: [phone]  Acute Care Office Visit  Subjective:   [name] [birth year] 02/26/2024  Chief Complaint  Patient presents with   Cough    2 weeks cough, headache, fever pressure wheezing, chills    HPI:  Discussed the use of AI scribe software for clinical note transcription with the patient, who gave verbal consent to proceed.  History of Present Illness   [name] is a 58 year old female with COPD and chronic bronchitis who presents with a cough lasting two weeks.  The cough began two weeks ago, initially accompanied by flu-like symptoms such as fever, chills, and head congestion. While the fever and chills have resolved, she continues to experience a persistent cough, chest congestion, and ear fullness.  The cough is productive, bringing up yellowish mucus, and is associated with chest tightness and rib soreness. She also experiences sinus pressure and tenderness. Over-the-counter medications like Mucinex , DayQuil, and NyQuil provide some relief.  She has a history of using an albuterol  nebulizer but currently lacks the necessary equipment to use it. She needs neb tubing. She reports wheezing and shortness of breath, which are exacerbated by her smoking habit.      The following portions of the patient's history were reviewed and updated as appropriate: past medical history, past surgical history, family history, social history, allergies, medications, and problem list.   Patient Active Problem List   Diagnosis Date Noted   Viral URI with cough 01/14/2024   Positive colorectal cancer screening using Cologuard test 10/29/2023   Coronary atherosclerosis 05/23/2022   Aortic atherosclerosis 05/23/2022   Arthritis of left knee 12/14/2021   Marijuana use 03/14/2021   Allergic rhinitis 03/14/2021   Angina pectoris  09/08/2019   Benzodiazepine overdose 03/05/2019   Generalized muscle ache 09/24/2018   Chronic right ear pain 03/11/2018   Chronic sinusitis 03/11/2018   H/O drug abuse (HCC) 11/12/2017   Atypical chest pain 10/05/2017   COPD with chronic bronchitis (HCC) 10/01/2017   Mixed hyperlipidemia 10/01/2017   Tobacco use disorder 10/01/2017   Diabetic peripheral neuropathy (HCC) 10/01/2017   Iron deficiency anemia 10/01/2017   Adult attention deficit hyperactivity disorder 01/14/2016   Major depressive disorder, recurrent severe without psychotic features (HCC)    Leukocytosis 03/25/2014   OSA on CPAP 04/11/2013   Perforated ear drum, right 05/22/2012   Bell's palsy 01/23/2012   Carpal tunnel syndrome on both sides 01/11/2012   Vitamin D  deficiency 07/06/2011   Asthmatic bronchitis 06/11/2011   Low back pain 06/11/2011   Morbid obesity with body mass index (BMI) of 40.0 to 44.9 in adult (HCC) 05/06/2011   Gastric erosions 12/05/2010   Essential hypertension 12/02/2010   Type 2 diabetes mellitus with polyneuropathy (HCC) 12/02/2010   COPD with acute exacerbation (HCC) 11/30/2010   GERD (gastroesophageal reflux disease) 09/08/2010   Esophageal dysphagia 09/08/2010   Past Medical History:  Diagnosis Date   ADHD    Alcohol abuse    Anemia    Anxiety    Asthmatic bronchitis    normal PFT/ seen by pulmonary no evidence of COPD   Atypical chest pain 10/05/2017   Back pain    Bilateral swelling of feet    Chest pain    Chest pain on respiration 03/25/2014   Chronic bronchitis (HCC)    Chronic lower back pain    Chronic respiratory failure (HCC) 10/16/2011   Newly 02  dep 24/7 p discharge from [facility] 01/2013  - 03/13/2014  Walked RA  2 laps @ 185 ft each stopped due to  Sob/ aching in legs, thirsty/ no desat @ slow pace    Chronic respiratory failure with hypoxia (HCC)    On 2-3 L of oxygen  at home   Cigarette smoker 12/02/2010   Followed in Pulmonary clinic/ [facility]/ [place]   -  Limits of effective care reviewed 12/22/2011     Complication of anesthesia    Constipation    COPD (chronic obstructive pulmonary disease) (HCC)    COPD with chronic bronchitis (HCC) 10/01/2017   Daily headache    Depression    Diabetic peripheral neuropathy (HCC)    DM (diabetes mellitus) type II controlled, neurological manifestation (HCC) 12/02/2010   Drug use    Gallbladder problem    Gastric erosions    EGD 08/2010.   GERD (gastroesophageal reflux disease)    Glaucoma    H/O drug abuse (HCC) 11/12/2017   -- scanned document from outside source: [facility] in [place]  which showed UDS positive for Adderall /amphetamine  usage as well as positive urine for THC.  This test result was collected 08/11/2016 and reported 10/07/2016. - lso review of the chart shows another positive amphetamine , THC and METH in the urine back on 10/18/2015 under care everywhere. ---So    Heavy menses    High cholesterol    History of blood transfusion    related to low HgB (10/05/2017)   History of hiatal hernia    HTN (hypertension)    Hyperlipidemia associated with type 2 diabetes mellitus (HCC) 10/18/2015   Hypertension associated with diabetes (HCC) 12/02/2010   D/c acei 12/22/2011 due to psuedowheeze and narcotic dependent cough> ? Improved - 03/14/2014 started bystolic in place of cozar due to cough     IBS (irritable bowel syndrome)    Increased urinary protein excretion    Internal hemorrhoids    Colonoscopy 5/12.   Iron deficiency anemia 10/01/2017   Joint pain    Mixed diabetic hyperlipidemia associated with type 2 diabetes mellitus (HCC) 10/01/2017   On home oxygen  therapy    5L at night (10/05/2017)   OSA on CPAP    Osteoarthritis    back (10/05/2017)   Oxygen  dependent 10/16/2011   Pneumonia    lots of times (10/05/2017)   PONV (postoperative nausea and vomiting)    Poorly controlled diabetes mellitus (HCC) 11/12/2017   Pulmonary infiltrates 12/22/2011    Followed in Pulmonary clinic/ [facility]/ [place]    - See CT Chest 05/05/11    Shortness of breath    Tachycardia    never had test done since no insurance   Tobacco use disorder-current smoker greater than 40-pack-year history- since age 9 1 ppd 10/01/2017   Type II diabetes mellitus (HCC)    Vitamin D  deficiency    Past Surgical History:  Procedure Laterality Date   cataract surgery Left 03/18/2023   CESAREAN SECTION  1988; 1989   CHOLECYSTECTOMY OPEN  1990   COLONOSCOPY  09/16/2010   DOQ:wnmfjo colon/small internal hemorrhoids   ESOPHAGOGASTRODUODENOSCOPY  09/16/2010   SLF: normal/mild gastritis   ESOPHAGOGASTRODUODENOSCOPY N/A 10/19/2014   Procedure: ESOPHAGOGASTRODUODENOSCOPY (EGD);  Surgeon: Margo LITTIE Haddock, MD;  Location: [facility];  Service: Endoscopy;  Laterality: N/A;  830   FRACTURE SURGERY     HYSTEROSCOPY WITH THERMACHOICE  01/17/2012   Procedure: HYSTEROSCOPY WITH THERMACHOICE;  Surgeon: Vonn VEAR Inch,  MD;  Location: [facility] ORS;  Service: Gynecology;  Laterality: N/A;  total therapy time: 9:13sec  D5W  18 ml in, D5W   18ml out, temperture 87degrees celcious   KIDNEY SURGERY     as child for blockages   LEFT HEART CATH AND CORONARY ANGIOGRAPHY N/A 09/08/2019   Procedure: LEFT HEART CATH AND CORONARY ANGIOGRAPHY;  Surgeon: Jordan, Peter M, MD;  Location: [facility] INVASIVE CV LAB;  Service: Cardiovascular;  Laterality: N/A;   TONSILLECTOMY     TUBAL LIGATION  1989   TYMPANOSTOMY TUBE PLACEMENT Bilateral    several times when I was a child   uterine ablation     WRIST FRACTURE SURGERY Left 1995   Family History  Problem Relation Age of Onset   Heart attack Mother 31       deceased   Diabetes Mother    Breast cancer Mother 57       inflammatory breast ca   Heart failure Mother        oxygen  dependence, nonsmoker   Heart disease Mother    Depression Mother    Cancer Mother        Breast   Hypertension Mother    Hyperlipidemia Mother    Sudden death Mother     Sleep apnea Mother    Obesity Mother    Heart attack Father 36       deceased, etoh use   Heart disease Father    Alcohol abuse Father    Depression Father    Heart failure Father    Sudden death Father    Ulcers Sister    Hypertension Sister    Heart failure Sister    Depression Sister    Anxiety disorder Sister    Liver disease Maternal Aunt 40       died while on liver transplant list   Liver disease Maternal Uncle    Cancer Paternal Aunt        Breast   Heart disease Maternal Grandmother    Heart attack Maternal Grandmother        premature CAD   Colon cancer Neg Hx     Current Outpatient Medications:    ACCU-CHEK GUIDE test strip, USE UP TO FOUR TIMES DAILY AS DIRECTED, Disp: 200 strip, Rfl: 11   Accu-Chek Softclix Lancets lancets, USE UP TO FOUR TIMES DAILY AS DIRECTED, Disp: 300 each, Rfl: 0   albuterol  (PROVENTIL ) (2.5 MG/3ML) 0.083% nebulizer solution, Take 3 mLs (2.5 mg total) by nebulization every 6 (six) hours as needed for wheezing or shortness of breath., Disp: 75 mL, Rfl: 0   albuterol  (VENTOLIN  HFA) 108 (90 Base) MCG/ACT inhaler, Inhale 2 puffs into the lungs every 6 (six) hours as needed for wheezing or shortness of breath., Disp: 8 g, Rfl: 6   amoxicillin -clavulanate (AUGMENTIN ) 875-125 MG tablet, Take 1 tablet by mouth 2 (two) times daily for 7 days., Disp: 14 tablet, Rfl: 0   amphetamine -dextroamphetamine  (ADDERALL) 30 MG tablet, Take 1 tablet by mouth 2 (two) times daily., Disp: 60 tablet, Rfl: 0   amphetamine -dextroamphetamine  (ADDERALL) 30 MG tablet, Take 1 tablet by mouth 2 (two) times daily., Disp: 60 tablet, Rfl: 0   amphetamine -dextroamphetamine  (ADDERALL) 30 MG tablet, Take 1 tablet by mouth 2 (two) times daily., Disp: 60 tablet, Rfl: 0   ARIPiprazole  (ABILIFY ) 5 MG tablet, Take 1 tablet (5 mg total) by mouth daily., Disp: 90 tablet, Rfl: 1   atorvastatin  (LIPITOR) 40 MG tablet, TAKE 0.5 TABLETS (20 MG TOTAL)  BY MOUTH 2 (TWO) TIMES DAILY., Disp: 90  tablet, Rfl: 3   bismuth subsalicylate (PEPTO BISMOL) 262 MG/15ML suspension, Take 30 mLs by mouth every 6 (six) hours as needed for indigestion., Disp: , Rfl:    blood glucose meter kit and supplies, Dispense based on patient and insurance preference. Use up to four times daily as directed. (FOR ICD-10 E10.9, E11.9)., Disp: 1 each, Rfl: 0   clonazePAM  (KLONOPIN ) 0.5 MG tablet, Take 1 tablet (0.5 mg total) by mouth 2 (two) times daily as needed for anxiety., Disp: 60 tablet, Rfl: 2   DULoxetine  (CYMBALTA ) 60 MG capsule, Take 1 capsule (60 mg total) by mouth in the morning., Disp: 90 capsule, Rfl: 2   fluticasone  (FLONASE ) 50 MCG/ACT nasal spray, PLACE 1 SPRAY INTO BOTH NOSTRILS 2 (TWO) TIMES DAILY AS NEEDED FOR ALLERGIES OR RHINITIS (AFTER SINUS RINSE)., Disp: 48 mL, Rfl: 2   ibuprofen  (ADVIL ) 200 MG tablet, Take 600 mg by mouth every 6 (six) hours as needed for mild pain or headache., Disp: , Rfl:    insulin  glargine (LANTUS  SOLOSTAR) 100 UNIT/ML Solostar Pen, Inject 20 Units into the skin daily., Disp: 15 mL, Rfl: PRN   Insulin  Pen Needle (BD PEN NEEDLE NANO 2ND GEN) 32G X 4 MM MISC, USE DAILY WITH VICTOZA , Disp: 100 each, Rfl: 11   Insulin  Syringe-Needle U-100 (INSULIN  SYRINGE .5CC/31GX5/16) 31G X 5/16 0.5 ML MISC, Inject 1 Units into the muscle daily. use as directed, Disp: 100 each, Rfl: 2   ipratropium-albuterol  (DUONEB) 0.5-2.5 (3) MG/3ML SOLN, Take 3 mLs by nebulization 3 (three) times daily., Disp: 360 mL, Rfl: 3   meloxicam  (MOBIC ) 15 MG tablet, Take 15 mg by mouth daily., Disp: , Rfl:    ondansetron  (ZOFRAN -ODT) 4 MG disintegrating tablet, Take 1 tablet (4 mg total) by mouth every 8 (eight) hours as needed., Disp: 20 tablet, Rfl: 2   pantoprazole  (PROTONIX ) 40 MG tablet, TAKE 1 TABLET BY MOUTH TWICE A DAY, Disp: 180 tablet, Rfl: 3   predniSONE  (DELTASONE ) 20 MG tablet, Take 2 tablets (40 mg total) by mouth daily with breakfast for 5 days., Disp: 10 tablet, Rfl: 0   pregabalin  (LYRICA ) 150  MG capsule, Take 1 capsule (150 mg total) by mouth 2 (two) times daily., Disp: 180 capsule, Rfl: 3   Semaglutide , 2 MG/DOSE, (OZEMPIC , 2 MG/DOSE,) 8 MG/3ML SOPN, Inject 2 mg into the skin once a week., Disp: 12 mL, Rfl: 3   umeclidinium-vilanterol (ANORO ELLIPTA ) 62.5-25 MCG/ACT AEPB, Inhale 1 puff into the lungs daily., Disp: 30 each, Rfl: 11   naproxen  (NAPROSYN ) 375 MG tablet, Take 1 tablet (375 mg total) by mouth 2 (two) times daily. (Patient not taking: Reported on 02/26/2024), Disp: 20 tablet, Rfl: 0  Current Facility-Administered Medications:    ipratropium-albuterol  (DUONEB) 0.5-2.5 (3) MG/3ML nebulizer solution 3 mL, 3 mL, Nebulization, Once,  Allergies  Allergen Reactions   Codeine  Itching and Nausea Only   Metformin  And Related Diarrhea, Nausea Only and Other (See Comments)    Stomach pain/nausea   Wellbutrin  [Bupropion  Hcl] Hives   Acyclovir And Related Rash     ROS: A complete ROS was performed with pertinent positives/negatives noted in the HPI. The remainder of the ROS are negative.    Objective:   Today's Vitals   02/26/24 0947  BP: 118/74  Pulse: 86  Temp: 97.8 F (36.6 C)  TempSrc: Temporal  SpO2: 98%  Weight: 262 lb 6.4 oz (119 kg)  Height: 5' 4 (1.626 m)  GENERAL: Well-appearing, in NAD. Well nourished.  SKIN: Pink, warm and dry. No rash.  HEENT:    HEAD: Normocephalic, non-traumatic.  EYES: Conjunctive pink without exudate. PERRL.  EARS: External ear w/o redness, swelling, masses, or lesions. EAC clear. TM's intact, erythematous translucent w/o bulging, appropriate landmarks visualized.  NOSE: Septum midline w/o deformity. Nares patent, mucosa pink and non-inflamed w/o drainage. Maxillary and frontal sinus tenderness.  THROAT: Uvula midline. Oropharynx clear. Tonsils absent. Mucus membranes pink and moist.  NECK: Trachea midline. Full ROM w/o pain or tenderness. Submandibular lymph node swelling.  RESPIRATORY: Chest wall symmetrical. Respirations even  and non-labored. Expiratory wheezing diffuse bilaterally, rhonchi bilateral lower lobes.   CARDIAC: S1, S2 present, regular rate and rhythm. Peripheral pulses 2+ bilaterally.  EXTREMITIES: Without clubbing, cyanosis, or edema.  NEUROLOGIC: Steady, even gait.  PSYCH/MENTAL STATUS: Alert, oriented x 3. Cooperative, appropriate mood and affect.    No results found for any visits on 02/26/24.    Assessment & Plan:  Assessment and Plan    Acute bronchitis with COPD Cough with chest congestion, wheezing, and rib soreness. Shortness of breath and chest tightness. Current smoker with COPD and chronic bronchitis. Exacerbation of chronic bronchitis.  - Administered nebulizer treatment in office. - Prescribed prednisone . - Provided nebulizer tubing for home use. - Continue Mucinex . - Rx Augmentin  BID x 7 days - Advised follow-up if symptoms worsen   Acute sinusitis - Prescribed Augmentin  BID x 7 days - Advised follow-up if symptoms worsen.       Meds ordered this encounter  Medications   ipratropium-albuterol  (DUONEB) 0.5-2.5 (3) MG/3ML nebulizer solution 3 mL   predniSONE  (DELTASONE ) 20 MG tablet    Sig: Take 2 tablets (40 mg total) by mouth daily with breakfast for 5 days.    Dispense:  10 tablet    Refill:  0    Supervising Provider:   THOMPSON, AARON B [[id number]]   amoxicillin -clavulanate (AUGMENTIN ) 875-125 MG tablet    Sig: Take 1 tablet by mouth 2 (two) times daily for 7 days.    Dispense:  14 tablet    Refill:  0    Supervising Provider:   THOMPSON, AARON B [[id number]]   No orders of the defined types were placed in this encounter.  Lab Orders  No laboratory test(s) ordered today   No images are attached to the encounter or orders placed in the encounter.  Return if symptoms worsen or fail to improve.   Rosina Senters, FNP

## 2024-03-09 NOTE — Progress Notes (Unsigned)
 Ellouise Console, PA-C [address] [place], [place]  [address] Phone: [phone]   Gastroenterology Consultation  Referring Provider:     Thedora Garnette HERO, MD Primary Care Physician:  Thedora Garnette HERO, MD Primary Gastroenterologist:  Ellouise Console, PA-C / Elspeth Naval, MD  Reason for Consultation:     Discuss colonoscopy; positive Cologuard        HPI:   Discussed the use of AI scribe software for clinical note transcription with the patient, who gave verbal consent to proceed.  58 year old female, new patient, is referred to discuss colonoscopy.  She has never had a colonoscopy.  She had a positive Cologuard screening test 10/23/2023.  08/2010 EGD  History of Present Illness    PMH: COPD, CAD, HTN, sleep apnea, type 2 diabetes, ADHD, history of alcohol/tobacco/drug use disorders, marijuana use, peripheral neuropathy, esophageal dysphagia, GERD, gastric erosions, morbid obesity.  Uses 2 to 3 L of home oxygen .  Current BMI 45.  Last echo 09/2019 showed LVEF 60 to 65%.    Past Medical History:  Diagnosis Date   ADHD    Alcohol abuse    Anemia    Anxiety    Asthmatic bronchitis    normal PFT/ seen by pulmonary no evidence of COPD   Atypical chest pain 10/05/2017   Back pain    Bilateral swelling of feet    Chest pain    Chest pain on respiration 03/25/2014   Chronic bronchitis (HCC)    Chronic lower back pain    Chronic respiratory failure (HCC) 10/16/2011   Newly 02 dep 24/7 p discharge from [facility] 01/2013  - 03/13/2014  Walked RA  2 laps @ 185 ft each stopped due to  Sob/ aching in legs, thirsty/ no desat @ slow pace    Chronic respiratory failure with hypoxia (HCC)    On 2-3 L of oxygen  at home   Cigarette smoker 12/02/2010   Followed in Pulmonary clinic/ [facility]/ [place]   - Limits of effective care reviewed 12/22/2011     Complication of anesthesia    Constipation    COPD (chronic obstructive pulmonary disease) (HCC)    COPD with chronic bronchitis (HCC)  10/01/2017   Daily headache    Depression    Diabetic peripheral neuropathy (HCC)    DM (diabetes mellitus) type II controlled, neurological manifestation (HCC) 12/02/2010   Drug use    Gallbladder problem    Gastric erosions    EGD 08/2010.   GERD (gastroesophageal reflux disease)    Glaucoma    H/O drug abuse (HCC) 11/12/2017   -- scanned document from outside source: [facility] in [place]  which showed UDS positive for Adderall /amphetamine  usage as well as positive urine for THC.  This test result was collected 08/11/2016 and reported 10/07/2016. - lso review of the chart shows another positive amphetamine , THC and METH in the urine back on 10/18/2015 under care everywhere. ---So    Heavy menses    High cholesterol    History of blood transfusion    related to low HgB (10/05/2017)   History of hiatal hernia    HTN (hypertension)    Hyperlipidemia associated with type 2 diabetes mellitus (HCC) 10/18/2015   Hypertension associated with diabetes (HCC) 12/02/2010   D/c acei 12/22/2011 due to psuedowheeze and narcotic dependent cough> ? Improved - 03/14/2014 started bystolic in place of cozar due to cough     IBS (irritable bowel syndrome)  Increased urinary protein excretion    Internal hemorrhoids    Colonoscopy 5/12.   Iron deficiency anemia 10/01/2017   Joint pain    Mixed diabetic hyperlipidemia associated with type 2 diabetes mellitus (HCC) 10/01/2017   On home oxygen  therapy    5L at night (10/05/2017)   OSA on CPAP    Osteoarthritis    back (10/05/2017)   Oxygen  dependent 10/16/2011   Pneumonia    lots of times (10/05/2017)   PONV (postoperative nausea and vomiting)    Poorly controlled diabetes mellitus (HCC) 11/12/2017   Pulmonary infiltrates 12/22/2011   Followed in Pulmonary clinic/ [facility]/ [place]    - See CT Chest 05/05/11    Shortness of breath    Tachycardia    never had test done since no insurance   Tobacco use  disorder-current smoker greater than 40-pack-year history- since age 4 1 ppd 10/01/2017   Type II diabetes mellitus (HCC)    Vitamin D  deficiency     Past Surgical History:  Procedure Laterality Date   cataract surgery Left 03/18/2023   CESAREAN SECTION  1988; 1989   CHOLECYSTECTOMY OPEN  1990   COLONOSCOPY  09/16/2010   DOQ:wnmfjo colon/small internal hemorrhoids   ESOPHAGOGASTRODUODENOSCOPY  09/16/2010   SLF: normal/mild gastritis   ESOPHAGOGASTRODUODENOSCOPY N/A 10/19/2014   Procedure: ESOPHAGOGASTRODUODENOSCOPY (EGD);  Surgeon: Margo LITTIE Haddock, MD;  Location: [facility];  Service: Endoscopy;  Laterality: N/A;  830   FRACTURE SURGERY     HYSTEROSCOPY WITH THERMACHOICE  01/17/2012   Procedure: HYSTEROSCOPY WITH THERMACHOICE;  Surgeon: Vonn VEAR Inch, MD;  Location: [facility] ORS;  Service: Gynecology;  Laterality: N/A;  total therapy time: 9:13sec  D5W  18 ml in, D5W   18ml out, temperture 87degrees celcious   KIDNEY SURGERY     as child for blockages   LEFT HEART CATH AND CORONARY ANGIOGRAPHY N/A 09/08/2019   Procedure: LEFT HEART CATH AND CORONARY ANGIOGRAPHY;  Surgeon: Jordan, Peter M, MD;  Location: [facility] INVASIVE CV LAB;  Service: Cardiovascular;  Laterality: N/A;   TONSILLECTOMY     TUBAL LIGATION  1989   TYMPANOSTOMY TUBE PLACEMENT Bilateral    several times when I was a child   uterine ablation     WRIST FRACTURE SURGERY Left 1995    Prior to Admission medications   Medication Sig Start Date End Date Taking? Authorizing Provider  ACCU-CHEK GUIDE test strip USE UP TO FOUR TIMES DAILY AS DIRECTED 08/18/19   Cirigliano, Ronal POUR, DO  Accu-Chek Softclix Lancets lancets USE UP TO FOUR TIMES DAILY AS DIRECTED 06/20/19   Cirigliano, Mary K, DO  albuterol  (PROVENTIL ) (2.5 MG/3ML) 0.083% nebulizer solution Take 3 mLs (2.5 mg total) by nebulization every 6 (six) hours as needed for wheezing or shortness of breath. 01/14/24 02/26/24  Thedora Garnette HERO, MD  albuterol  (VENTOLIN  HFA) 108 (90 Base)  MCG/ACT inhaler Inhale 2 puffs into the lungs every 6 (six) hours as needed for wheezing or shortness of breath. 06/20/22   Thedora Garnette HERO, MD  amphetamine -dextroamphetamine  (ADDERALL) 30 MG tablet Take 1 tablet by mouth 2 (two) times daily. 02/25/24 02/24/25  Okey Barnie SAUNDERS, MD  amphetamine -dextroamphetamine  (ADDERALL) 30 MG tablet Take 1 tablet by mouth 2 (two) times daily. 02/25/24 02/24/25  Okey Barnie SAUNDERS, MD  amphetamine -dextroamphetamine  (ADDERALL) 30 MG tablet Take 1 tablet by mouth 2 (two) times daily. 02/25/24 02/24/25  Okey Barnie SAUNDERS, MD  ARIPiprazole  (ABILIFY ) 5 MG tablet Take 1 tablet (5 mg total) by mouth daily.  02/25/24 02/24/25  Okey Barnie SAUNDERS, MD  atorvastatin  (LIPITOR) 40 MG tablet TAKE 0.5 TABLETS (20 MG TOTAL) BY MOUTH 2 (TWO) TIMES DAILY. 09/06/23   Thedora Garnette HERO, MD  bismuth subsalicylate (PEPTO BISMOL) 262 MG/15ML suspension Take 30 mLs by mouth every 6 (six) hours as needed for indigestion.    [provider]  blood glucose meter kit and supplies Dispense based on patient and insurance preference. Use up to four times daily as directed. (FOR ICD-10 E10.9, E11.9). 06/20/19   Cirigliano, Ronal POUR, DO  clonazePAM  (KLONOPIN ) 0.5 MG tablet Take 1 tablet (0.5 mg total) by mouth 2 (two) times daily as needed for anxiety. 02/25/24   Okey Barnie SAUNDERS, MD  DULoxetine  (CYMBALTA ) 60 MG capsule Take 1 capsule (60 mg total) by mouth in the morning. 02/25/24   Okey Barnie SAUNDERS, MD  fluticasone  (FLONASE ) 50 MCG/ACT nasal spray PLACE 1 SPRAY INTO BOTH NOSTRILS 2 (TWO) TIMES DAILY AS NEEDED FOR ALLERGIES OR RHINITIS (AFTER SINUS RINSE). 01/26/22   Thedora Garnette HERO, MD  ibuprofen  (ADVIL ) 200 MG tablet Take 600 mg by mouth every 6 (six) hours as needed for mild pain or headache.    [provider]  insulin  glargine (LANTUS  SOLOSTAR) 100 UNIT/ML Solostar Pen Inject 20 Units into the skin daily. 12/10/23   Thedora Garnette HERO, MD  Insulin  Pen Needle (BD PEN NEEDLE NANO 2ND GEN) 32G X 4 MM MISC USE  DAILY WITH VICTOZA  01/14/24   Thedora Garnette HERO, MD  Insulin  Syringe-Needle U-100 (INSULIN  SYRINGE .5CC/31GX5/16) 31G X 5/16 0.5 ML MISC Inject 1 Units into the muscle daily. use as directed 04/02/23   Thedora Garnette HERO, MD  ipratropium-albuterol  (DUONEB) 0.5-2.5 (3) MG/3ML SOLN Take 3 mLs by nebulization 3 (three) times daily. 06/20/22   Thedora Garnette HERO, MD  meloxicam  (MOBIC ) 15 MG tablet Take 15 mg by mouth daily. 06/22/23   [provider]  naproxen  (NAPROSYN ) 375 MG tablet Take 1 tablet (375 mg total) by mouth 2 (two) times daily. Patient not taking: Reported on 02/26/2024 06/15/23   Raspet, Erin K, PA-C  ondansetron  (ZOFRAN -ODT) 4 MG disintegrating tablet Take 1 tablet (4 mg total) by mouth every 8 (eight) hours as needed. 07/09/23   Thedora Garnette HERO, MD  pantoprazole  (PROTONIX ) 40 MG tablet TAKE 1 TABLET BY MOUTH TWICE A DAY 05/23/23   Thedora Garnette HERO, MD  pregabalin  (LYRICA ) 150 MG capsule Take 1 capsule (150 mg total) by mouth 2 (two) times daily. 07/09/23   Thedora Garnette HERO, MD  Semaglutide , 2 MG/DOSE, (OZEMPIC , 2 MG/DOSE,) 8 MG/3ML SOPN Inject 2 mg into the skin once a week. 01/02/24   Thedora Garnette HERO, MD  umeclidinium-vilanterol (ANORO ELLIPTA ) 62.5-25 MCG/ACT AEPB Inhale 1 puff into the lungs daily. 06/29/22   Thedora Garnette HERO, MD    Family History  Problem Relation Age of Onset   Heart attack Mother 16       deceased   Diabetes Mother    Breast cancer Mother 59       inflammatory breast ca   Heart failure Mother        oxygen  dependence, nonsmoker   Heart disease Mother    Depression Mother    Cancer Mother        Breast   Hypertension Mother    Hyperlipidemia Mother    Sudden death Mother    Sleep apnea Mother    Obesity Mother    Heart attack Father 34  deceased, etoh use   Heart disease Father    Alcohol abuse Father    Depression Father    Heart failure Father    Sudden death Father    Ulcers Sister    Hypertension Sister    Heart failure Sister    Depression  Sister    Anxiety disorder Sister    Liver disease Maternal Aunt 40       died while on liver transplant list   Liver disease Maternal Uncle    Cancer Paternal Aunt        Breast   Heart disease Maternal Grandmother    Heart attack Maternal Grandmother        premature CAD   Colon cancer Neg Hx      Social History   Tobacco Use   Smoking status: Every Day    Current packs/day: 1.50    Average packs/day: 1.5 packs/day for 45.9 years (68.8 ttl pk-yrs)    Types: Cigarettes    Start date: 1980   Smokeless tobacco: Never   Tobacco comments:    vape  Vaping Use   Vaping status: Former  Substance Use Topics   Alcohol use: Not Currently   Drug use: Yes    Types: Marijuana    Comment: occ    Allergies as of 03/10/2024 - Review Complete 02/26/2024  Allergen Reaction Noted   Codeine  Itching and Nausea Only 09/01/2010   Metformin  and related Diarrhea, Nausea Only, and Other (See Comments) 08/31/2014   Wellbutrin  [bupropion  hcl] Hives 06/09/2011   Acyclovir and related Rash 10/26/2017    Review of Systems:    All systems reviewed and negative except where noted in HPI.   Physical Exam:  LMP 07/24/2014 Comment: pt has had ablation, but still has some periods Patient's last menstrual period was 07/24/2014.  General:   Alert,  Well-developed, well-nourished, pleasant and cooperative in NAD Lungs:  Respirations even and unlabored.  Clear throughout to auscultation.   No wheezes, crackles, or rhonchi. No acute distress. Heart:  Regular rate and rhythm; no murmurs, clicks, rubs, or gallops. Abdomen:  Normal bowel sounds.  No bruits.  Soft, and non-distended without masses, hepatosplenomegaly or hernias noted.  No Tenderness.  No guarding or rebound tenderness.    Neurologic:  Alert and oriented x3;  grossly normal neurologically. Psych:  Alert and cooperative. Normal mood and affect.   Imaging Studies: No results found.  Labs: CBC    Component Value Date/Time   WBC 11.6  (H) 07/09/2023 1340   RBC 5.47 (H) 07/09/2023 1340   HGB 14.2 07/09/2023 1340   HGB 13.9 06/15/2023 1837   HCT 44.0 07/09/2023 1340   HCT 43.7 06/15/2023 1837   PLT 270.0 07/09/2023 1340   PLT 280 06/15/2023 1837   MCV 80.4 07/09/2023 1340   MCV 81 06/15/2023 1837    CMP     Component Value Date/Time   NA 143 10/05/2023 1532   NA 137 06/15/2023 1837   K 3.9 10/05/2023 1532   CL 106 10/05/2023 1532   CO2 29 10/05/2023 1532   GLUCOSE 133 (H) 01/14/2024 1543   BUN 24 10/05/2023 1532   BUN 10 06/15/2023 1837   CREATININE 0.98 10/05/2023 1532   CALCIUM  9.0 10/05/2023 1532   PROT 7.1 07/09/2023 1340   PROT 7.1 06/30/2019 1345   ALBUMIN 4.1 07/09/2023 1340   ALBUMIN 4.5 06/30/2019 1345   AST 15 07/09/2023 1340   ALT 14 07/09/2023 1340   ALKPHOS 67 07/09/2023 1340  BILITOT 0.8 07/09/2023 1340   BILITOT 0.4 06/30/2019 1345   GFRNONAA >60 06/13/2022 0500   GFRAA 94 10/29/2019 1535    Assessment and Plan:   Janne Eurydice Calixto is a 58 y.o. y/o female has been referred for ***  Assessment and Plan Assessment & Plan       Follow up ***  Ellouise Console, PA-C

## 2024-03-10 ENCOUNTER — Encounter: Payer: Self-pay | Admitting: Physician Assistant

## 2024-03-10 ENCOUNTER — Ambulatory Visit (INDEPENDENT_AMBULATORY_CARE_PROVIDER_SITE_OTHER): Admitting: Physician Assistant

## 2024-03-10 VITALS — BP 112/64 | HR 100 | Ht 64.0 in | Wt 263.5 lb

## 2024-03-10 DIAGNOSIS — R195 Other fecal abnormalities: Secondary | ICD-10-CM | POA: Diagnosis not present

## 2024-03-10 DIAGNOSIS — R142 Eructation: Secondary | ICD-10-CM

## 2024-03-10 DIAGNOSIS — K219 Gastro-esophageal reflux disease without esophagitis: Secondary | ICD-10-CM

## 2024-03-10 DIAGNOSIS — J4489 Other specified chronic obstructive pulmonary disease: Secondary | ICD-10-CM

## 2024-03-10 NOTE — Patient Instructions (Signed)
 You have been scheduled for an Endoscopy and Colonoscopy. Please follow the written instructions given to you at your visit today.  If you use inhalers (even only as needed), please bring them with you on the day of your procedure.  DO NOT TAKE 7 DAYS PRIOR TO TEST- Trulicity (dulaglutide) Ozempic , Wegovy  (semaglutide ) Mounjaro (tirzepatide) Bydureon Bcise (exanatide extended release)  DO NOT TAKE 1 DAY PRIOR TO YOUR TEST Rybelsus  (semaglutide ) Adlyxin (lixisenatide) Victoza  (liraglutide ) Byetta (exanatide) ___________________________________________________________________________  Please follow up sooner if symptoms increase or worsen __________________________________________________________________________  Due to recent changes in healthcare laws, you may see the results of your imaging and laboratory studies on MyChart before your provider has had a chance to review them.  We understand that in some cases there may be results that are confusing or concerning to you. Not all laboratory results come back in the same time frame and the provider may be waiting for multiple results in order to interpret others.  Please give us  48 hours in order for your provider to thoroughly review all the results before contacting the office for clarification of your results.   Thank you for trusting me with your gastrointestinal care!   Ellouise Console, PA-C _______________________________________________________  If your blood pressure at your visit was 140/90 or greater, please contact your primary care physician to follow up on this.  _______________________________________________________  If you are age 61 or older, your body mass index should be between 23-30. Your Body mass index is 45.23 kg/m. If this is out of the aforementioned range listed, please consider follow up with your Primary Care Provider.  If you are age 58 or younger, your body mass index should be between 19-25. Your Body  mass index is 45.23 kg/m. If this is out of the aformentioned range listed, please consider follow up with your Primary Care Provider.   ________________________________________________________  The [facility] providers would like to encourage you to use MYCHART to communicate with providers for non-urgent requests or questions.  Due to long hold times on the telephone, sending your provider a message by [facility] may be a faster and more efficient way to get a response.  Please allow 48 business hours for a response.  Please remember that this is for non-urgent requests.  _______________________________________________________

## 2024-03-28 ENCOUNTER — Other Ambulatory Visit: Payer: Self-pay | Admitting: Family Medicine

## 2024-03-28 DIAGNOSIS — M25511 Pain in right shoulder: Secondary | ICD-10-CM

## 2024-03-28 DIAGNOSIS — M5442 Lumbago with sciatica, left side: Secondary | ICD-10-CM

## 2024-03-28 MED ORDER — MELOXICAM 15 MG PO TABS: 15.0000 mg | ORAL_TABLET | Freq: Every day | ORAL | 3 refills | Status: AC

## 2024-04-11 ENCOUNTER — Other Ambulatory Visit: Payer: Self-pay | Admitting: Family Medicine

## 2024-04-11 DIAGNOSIS — E1142 Type 2 diabetes mellitus with diabetic polyneuropathy: Secondary | ICD-10-CM

## 2024-04-14 ENCOUNTER — Encounter (HOSPITAL_COMMUNITY): Payer: Self-pay | Admitting: Internal Medicine

## 2024-04-14 ENCOUNTER — Other Ambulatory Visit: Payer: Self-pay

## 2024-04-14 NOTE — Progress Notes (Signed)
 Anesthesia Review:  PCP: Cardiologist :  PPM/ ICD: Device Orders: Rep Notified:  Chest x-ray : CT Chest- 10/31/23  EKG : 06/03/21  Echo : 2021  Stress test: Cardiac Cath :  2021  Ct Cors- 2019   Activity level: can do a flight of stairs wthout difficutly  Sleep Study/ CPAP : has cpap  Fasting Blood Sugar :      / Checks Blood Sugar -- times a day:     DM- type 2 - pt does not have a meter currently and is not checking glucose.   Hgba1c- 01/2024 perpt-7.0  Lantus - pt takes aprox 1100am every am will take upon returning home from the procedure.  Semaglutide - last dose on 04/18/24   Blood Thinner/ Instructions /Last Dose: ASA / Instructions/ Last Dose :   Pt states she has received bowel preop instructions from MD but drug store does not have for her what she is to have.  Instructed pt to call DR Pyrtle office and let them be aware.  PT voiced understanding.     02/26/24- Bronchitis- Rosina Senters- LOV    PT has been off for hypertension meds x a month per MD due to blood pressure running normal per pt and off htn meds due to dizzy spells.

## 2024-04-21 ENCOUNTER — Telehealth: Payer: Self-pay

## 2024-04-21 NOTE — Telephone Encounter (Signed)
 Procedure:COLON Procedure date: 04/29/23 Procedure location: [facility] Arrival Time: 6:42 Spoke with the patient Y/N: N Any prep concerns? N Has the patient obtained the prep from the pharmacy ? N Do you have a care partner and transportation: N Any additional concerns? N  I called patient on 3 occasions. I left a detailed message about the procedure and the office number just in case patient has questions are concerns.

## 2024-04-22 ENCOUNTER — Inpatient Hospital Stay (HOSPITAL_COMMUNITY)
Admission: EM | Admit: 2024-04-22 | Discharge: 2024-04-28 | DRG: 190 | Disposition: A | Discharge status: Home / Self Care | Attending: Internal Medicine | Admitting: Internal Medicine

## 2024-04-22 ENCOUNTER — Encounter (HOSPITAL_COMMUNITY): Payer: Self-pay

## 2024-04-22 ENCOUNTER — Emergency Department (HOSPITAL_COMMUNITY)

## 2024-04-22 ENCOUNTER — Other Ambulatory Visit: Payer: Self-pay

## 2024-04-22 DIAGNOSIS — Z23 Encounter for immunization: Secondary | ICD-10-CM

## 2024-04-22 DIAGNOSIS — F17200 Nicotine dependence, unspecified, uncomplicated: Secondary | ICD-10-CM | POA: Diagnosis present

## 2024-04-22 DIAGNOSIS — Z7985 Long-term (current) use of injectable non-insulin antidiabetic drugs: Secondary | ICD-10-CM

## 2024-04-22 DIAGNOSIS — Z1152 Encounter for screening for COVID-19: Secondary | ICD-10-CM

## 2024-04-22 DIAGNOSIS — Z794 Long term (current) use of insulin: Secondary | ICD-10-CM

## 2024-04-22 DIAGNOSIS — J9811 Atelectasis: Secondary | ICD-10-CM | POA: Diagnosis present

## 2024-04-22 DIAGNOSIS — E782 Mixed hyperlipidemia: Secondary | ICD-10-CM | POA: Diagnosis present

## 2024-04-22 DIAGNOSIS — F909 Attention-deficit hyperactivity disorder, unspecified type: Secondary | ICD-10-CM | POA: Diagnosis present

## 2024-04-22 DIAGNOSIS — E876 Hypokalemia: Secondary | ICD-10-CM | POA: Diagnosis present

## 2024-04-22 DIAGNOSIS — J441 Chronic obstructive pulmonary disease with (acute) exacerbation: Principal | ICD-10-CM | POA: Diagnosis present

## 2024-04-22 DIAGNOSIS — J9621 Acute and chronic respiratory failure with hypoxia: Secondary | ICD-10-CM | POA: Diagnosis present

## 2024-04-22 DIAGNOSIS — Z791 Long term (current) use of non-steroidal anti-inflammatories (NSAID): Secondary | ICD-10-CM

## 2024-04-22 DIAGNOSIS — F32A Depression, unspecified: Secondary | ICD-10-CM | POA: Diagnosis present

## 2024-04-22 DIAGNOSIS — F17210 Nicotine dependence, cigarettes, uncomplicated: Secondary | ICD-10-CM | POA: Diagnosis present

## 2024-04-22 DIAGNOSIS — E1142 Type 2 diabetes mellitus with diabetic polyneuropathy: Secondary | ICD-10-CM | POA: Diagnosis present

## 2024-04-22 DIAGNOSIS — E7849 Other hyperlipidemia: Secondary | ICD-10-CM | POA: Diagnosis present

## 2024-04-22 DIAGNOSIS — Z833 Family history of diabetes mellitus: Secondary | ICD-10-CM

## 2024-04-22 DIAGNOSIS — J189 Pneumonia, unspecified organism: Secondary | ICD-10-CM | POA: Diagnosis present

## 2024-04-22 DIAGNOSIS — H409 Unspecified glaucoma: Secondary | ICD-10-CM | POA: Diagnosis present

## 2024-04-22 DIAGNOSIS — I152 Hypertension secondary to endocrine disorders: Secondary | ICD-10-CM | POA: Diagnosis present

## 2024-04-22 DIAGNOSIS — J4489 Other specified chronic obstructive pulmonary disease: Secondary | ICD-10-CM

## 2024-04-22 DIAGNOSIS — Z6841 Body Mass Index (BMI) 40.0 and over, adult: Secondary | ICD-10-CM

## 2024-04-22 DIAGNOSIS — F419 Anxiety disorder, unspecified: Secondary | ICD-10-CM | POA: Diagnosis present

## 2024-04-22 DIAGNOSIS — Z8249 Family history of ischemic heart disease and other diseases of the circulatory system: Secondary | ICD-10-CM

## 2024-04-22 DIAGNOSIS — J9601 Acute respiratory failure with hypoxia: Secondary | ICD-10-CM

## 2024-04-22 DIAGNOSIS — B348 Other viral infections of unspecified site: Secondary | ICD-10-CM | POA: Diagnosis present

## 2024-04-22 DIAGNOSIS — G4733 Obstructive sleep apnea (adult) (pediatric): Secondary | ICD-10-CM | POA: Diagnosis present

## 2024-04-22 DIAGNOSIS — E1169 Type 2 diabetes mellitus with other specified complication: Secondary | ICD-10-CM | POA: Diagnosis present

## 2024-04-22 DIAGNOSIS — Z8711 Personal history of peptic ulcer disease: Secondary | ICD-10-CM

## 2024-04-22 DIAGNOSIS — I10 Essential (primary) hypertension: Secondary | ICD-10-CM | POA: Diagnosis present

## 2024-04-22 DIAGNOSIS — Z83438 Family history of other disorder of lipoprotein metabolism and other lipidemia: Secondary | ICD-10-CM

## 2024-04-22 DIAGNOSIS — Z9981 Dependence on supplemental oxygen: Secondary | ICD-10-CM

## 2024-04-22 DIAGNOSIS — K219 Gastro-esophageal reflux disease without esophagitis: Secondary | ICD-10-CM | POA: Diagnosis present

## 2024-04-22 DIAGNOSIS — B9789 Other viral agents as the cause of diseases classified elsewhere: Secondary | ICD-10-CM | POA: Diagnosis present

## 2024-04-22 DIAGNOSIS — Z79899 Other long term (current) drug therapy: Secondary | ICD-10-CM

## 2024-04-22 DIAGNOSIS — E1159 Type 2 diabetes mellitus with other circulatory complications: Secondary | ICD-10-CM | POA: Diagnosis present

## 2024-04-22 DIAGNOSIS — E1165 Type 2 diabetes mellitus with hyperglycemia: Secondary | ICD-10-CM | POA: Diagnosis present

## 2024-04-22 DIAGNOSIS — I2510 Atherosclerotic heart disease of native coronary artery without angina pectoris: Secondary | ICD-10-CM | POA: Diagnosis present

## 2024-04-22 DIAGNOSIS — J440 Chronic obstructive pulmonary disease with acute lower respiratory infection: Secondary | ICD-10-CM | POA: Diagnosis present

## 2024-04-22 LAB — RESP PANEL BY RT-PCR (RSV, FLU A&B, COVID)  RVPGX2
Influenza A by PCR: NEGATIVE
Influenza B by PCR: NEGATIVE
Resp Syncytial Virus by PCR: NEGATIVE
SARS Coronavirus 2 by RT PCR: NEGATIVE

## 2024-04-22 LAB — BASIC METABOLIC PANEL WITH GFR
Anion gap: 12 (ref 5–15)
BUN: 7 mg/dL (ref 6–20)
CO2: 24 mmol/L (ref 22–32)
Calcium: 9.2 mg/dL (ref 8.9–10.3)
Chloride: 100 mmol/L (ref 98–111)
Creatinine, Ser: 0.78 mg/dL (ref 0.44–1.00)
GFR, Estimated: >60 mL/min
Glucose, Bld: 181 mg/dL — ABNORMAL HIGH (ref 70–99)
Potassium: 3.4 mmol/L — ABNORMAL LOW (ref 3.5–5.1)
Sodium: 136 mmol/L (ref 135–145)

## 2024-04-22 LAB — CBC
HCT: 43.8 % (ref 36.0–46.0)
Hemoglobin: 13.4 g/dL (ref 12.0–15.0)
MCH: 25.7 pg — ABNORMAL LOW (ref 26.0–34.0)
MCHC: 30.6 g/dL (ref 30.0–36.0)
MCV: 83.9 fL (ref 80.0–100.0)
Platelets: 252 K/uL (ref 150–400)
RBC: 5.22 MIL/uL — ABNORMAL HIGH (ref 3.87–5.11)
RDW: 16.1 % — ABNORMAL HIGH (ref 11.5–15.5)
WBC: 16 K/uL — ABNORMAL HIGH (ref 4.0–10.5)
nRBC: 0 % (ref 0.0–0.2)

## 2024-04-22 MED ORDER — PREDNISONE 20 MG PO TABS
60.0000 mg | ORAL_TABLET | Freq: Once | ORAL | Status: AC
  Administered 2024-04-23: 60 mg via ORAL
  Filled 2024-04-22: qty 3

## 2024-04-22 MED ORDER — SODIUM CHLORIDE 0.9 % IV SOLN
500.0000 mg | Freq: Once | INTRAVENOUS | Status: AC
  Administered 2024-04-23: 500 mg via INTRAVENOUS
  Filled 2024-04-22: qty 5

## 2024-04-22 MED ORDER — SODIUM CHLORIDE 0.9 % IV SOLN
1.0000 g | Freq: Once | INTRAVENOUS | Status: AC
  Administered 2024-04-23: 1 g via INTRAVENOUS
  Filled 2024-04-22: qty 10

## 2024-04-22 MED ORDER — ALBUTEROL SULFATE (2.5 MG/3ML) 0.083% IN NEBU
10.0000 mg/h | INHALATION_SOLUTION | Freq: Once | RESPIRATORY_TRACT | Status: AC
  Administered 2024-04-22: 10 mg/h via RESPIRATORY_TRACT
  Filled 2024-04-22: qty 12

## 2024-04-22 MED ORDER — IPRATROPIUM-ALBUTEROL 0.5-2.5 (3) MG/3ML IN SOLN: 3.0000 mL | Freq: Once | RESPIRATORY_TRACT | Status: DC

## 2024-04-22 NOTE — ED Provider Triage Note (Signed)
 Emergency Medicine Provider Triage Evaluation Note  [name] , a 58 y.o. female  was evaluated in triage.  Pt complains of shob, cough, hx frequent pna. Felt feverish Sunday, hasn't checked temp. Not on home O2, O2 85% on RA on arrival.  Exposed to family with flu. Hx COPD.  Review of Systems  Positive:  Negative:   Physical Exam  BP 118/69   Pulse (!) 114   Temp 99.1 F (37.3 C) (Oral)   Resp (!) 29   LMP 07/24/2014 Comment: pt has had ablation, but still has some periods  SpO2 (!) 85%  Gen:   Awake Resp:  Tachypenic, diffuse wheezing MSK:   Moves extremities without difficulty  Other:    Medical Decision Making  Medically screening exam initiated at 9:43 PM.  Appropriate orders placed.  Delina Camden Mazzaferro was informed that the remainder of the evaluation will be completed by another provider, this initial triage assessment does not replace that evaluation, and the importance of remaining in the ED until their evaluation is complete.     Beverley Leita LABOR, PA-C 04/22/24 2145

## 2024-04-22 NOTE — ED Notes (Signed)
SAVE BLUE TUBE IN MAIN LAB °

## 2024-04-22 NOTE — ED Triage Notes (Signed)
 Pt reports with shob, cough, and low O2 since Sunday. Pt states that she thinks that she has the flu.

## 2024-04-22 NOTE — ED Provider Notes (Signed)
 " [place] EMERGENCY DEPARTMENT AT [facility] Provider Note   CSN: [id number] Arrival date & time: 04/22/24  2106     Patient presents with: Shortness of Breath   [name] is a 58 y.o. female.  {Add pertinent medical, surgical, social history, OB history to YEP:67052}  Shortness of Breath Associated symptoms: cough and wheezing   Associated symptoms: no vomiting   Patient with history of COPD, ongoing tobacco use presents with shortness of breath.  Patient reports the past 2 days she has had increasing cough wheezing shortness of breath.  She reports chills but no recorded fever.  She reports body aches and headache.  No sore throat.  No hemoptysis, reports yellow productive phlegm with cough.  No recent travel or surgery.  No previous history of CAD/PE. Reports sick contacts over the weekend. She reports she is not on home oxygen   She reports chest and back soreness with coughing She denies any new lower extremity edema Past Medical History:  Diagnosis Date   ADHD    Alcohol abuse    Anxiety    Asthmatic bronchitis    normal PFT/ seen by pulmonary no evidence of COPD   Atypical chest pain 10/05/2017   Back pain    Bilateral swelling of feet    Chest pain    Chest pain on respiration 03/25/2014   Chronic bronchitis (HCC)    Chronic lower back pain    Chronic respiratory failure (HCC) 10/16/2011   Newly 02 dep 24/7 p discharge from [facility] 01/2013  - 03/13/2014  Walked RA  2 laps @ 185 ft each stopped due to  Sob/ aching in legs, thirsty/ no desat @ slow pace    Chronic respiratory failure with hypoxia (HCC)    On 2-3 L of oxygen  at home   Cigarette smoker 12/02/2010   Followed in Pulmonary clinic/  Healthcare/ [facility]   - Limits of effective care reviewed 12/22/2011     Complication of anesthesia    Constipation    COPD (chronic obstructive pulmonary disease) (HCC)    COPD with chronic bronchitis (HCC) 10/01/2017   Daily headache     Depression    Diabetic peripheral neuropathy (HCC)    DM (diabetes mellitus) type II controlled, neurological manifestation (HCC) 12/02/2010   Drug use    Gallbladder problem    Gastric erosions    EGD 08/2010.   GERD (gastroesophageal reflux disease)    Glaucoma    H/O drug abuse (HCC) 11/12/2017   -- scanned document from outside source: [facility] in [place]  which showed UDS positive for Adderall /amphetamine  usage as well as positive urine for THC.  This test result was collected 08/11/2016 and reported 10/07/2016. - lso review of the chart shows another positive amphetamine , THC and METH in the urine back on 10/18/2015 under care everywhere. ---So    Heavy menses    High cholesterol    History of blood transfusion    related to low HgB (10/05/2017)   History of hiatal hernia    HTN (hypertension)    Hyperlipidemia associated with type 2 diabetes mellitus (HCC) 10/18/2015   Hypertension associated with diabetes (HCC) 12/02/2010   D/c acei 12/22/2011 due to psuedowheeze and narcotic dependent cough> ? Improved - 03/14/2014 started bystolic in place of cozar due to cough     IBS (irritable bowel syndrome)    Increased urinary protein excretion    Internal hemorrhoids    Colonoscopy  5/12.   Joint pain    Mixed diabetic hyperlipidemia associated with type 2 diabetes mellitus (HCC) 10/01/2017   On home oxygen  therapy    5L at night (10/05/2017)   OSA on CPAP    Osteoarthritis    back (10/05/2017)   Oxygen  dependent 10/16/2011   Pneumonia    lots of times (10/05/2017)   PONV (postoperative nausea and vomiting)    Poorly controlled diabetes mellitus (HCC) 11/12/2017   Pulmonary infiltrates 12/22/2011   Followed in Pulmonary clinic/  Healthcare/ [facility]    - See CT Chest 05/05/11    Shortness of breath    Tachycardia    never had test done since no insurance   Tobacco use disorder-current smoker greater than 40-pack-year history- since  age 18 1 ppd 10/01/2017   Type II diabetes mellitus (HCC)    Vitamin D  deficiency     Prior to Admission medications  Medication Sig Start Date End Date Taking? Authorizing Provider  ACCU-CHEK GUIDE test strip USE UP TO FOUR TIMES DAILY AS DIRECTED 08/18/19   Cirigliano, Ronal POUR, DO  Accu-Chek Softclix Lancets lancets USE UP TO FOUR TIMES DAILY AS DIRECTED 06/20/19   Cirigliano, Mary K, DO  albuterol  (PROVENTIL ) (2.5 MG/3ML) 0.083% nebulizer solution Take 3 mLs (2.5 mg total) by nebulization every 6 (six) hours as needed for wheezing or shortness of breath. 01/14/24 03/10/24  Thedora Garnette HERO, MD  albuterol  (VENTOLIN  HFA) 108 [id number] Base) MCG/ACT inhaler Inhale 2 puffs into the lungs every 6 (six) hours as needed for wheezing or shortness of breath. 06/20/22   Thedora Garnette HERO, MD  amphetamine -dextroamphetamine  (ADDERALL) 30 MG tablet Take 1 tablet by mouth 2 (two) times daily. 02/25/24 02/24/25  Okey Barnie SAUNDERS, MD  ARIPiprazole  (ABILIFY ) 5 MG tablet Take 1 tablet (5 mg total) by mouth daily. 02/25/24 02/24/25  Okey Barnie SAUNDERS, MD  atorvastatin  (LIPITOR) 40 MG tablet TAKE 0.5 TABLETS (20 MG TOTAL) BY MOUTH 2 (TWO) TIMES DAILY. 09/06/23   Thedora Garnette HERO, MD  bismuth subsalicylate (PEPTO BISMOL) 262 MG/15ML suspension Take 30 mLs by mouth every 6 (six) hours as needed for indigestion.    [provider]  blood glucose meter kit and supplies Dispense based on patient and insurance preference. Use up to four times daily as directed. (FOR ICD-10 E10.9, E11.9). 06/20/19   Cirigliano, Ronal POUR, DO  clonazePAM  (KLONOPIN ) 0.5 MG tablet Take 1 tablet (0.5 mg total) by mouth 2 (two) times daily as needed for anxiety. 02/25/24   Okey Barnie SAUNDERS, MD  DULoxetine  (CYMBALTA ) 60 MG capsule Take 1 capsule (60 mg total) by mouth in the morning. 02/25/24   Okey Barnie SAUNDERS, MD  fluticasone  (FLONASE ) 50 MCG/ACT nasal spray PLACE 1 SPRAY INTO BOTH NOSTRILS 2 (TWO) TIMES DAILY AS NEEDED FOR ALLERGIES OR RHINITIS (AFTER SINUS  RINSE). 01/26/22   Thedora Garnette HERO, MD  ibuprofen  (ADVIL ) 200 MG tablet Take 600 mg by mouth every 6 (six) hours as needed for mild pain or headache.    [provider]  insulin  glargine (LANTUS  SOLOSTAR) 100 UNIT/ML Solostar Pen Inject 20 Units into the skin daily. 12/10/23   Thedora Garnette HERO, MD  Insulin  Pen Needle (BD PEN NEEDLE NANO 2ND GEN) 32G X 4 MM MISC USE DAILY WITH VICTOZA  01/14/24   Thedora Garnette HERO, MD  Insulin  Syringe-Needle U-100 (INSULIN  SYRINGE .5CC/31GX5/16) 31G X 5/16 0.5 ML MISC Inject 1 Units into the muscle daily. use as directed 04/02/23   Thedora Garnette HERO, MD  ipratropium-albuterol  (DUONEB) 0.5-2.5 (3) MG/3ML SOLN Take 3 mLs by nebulization 3 (three) times daily. 06/20/22   Thedora Garnette HERO, MD  meloxicam  (MOBIC ) 15 MG tablet Take 1 tablet (15 mg total) by mouth daily. 03/28/24   Thedora Garnette HERO, MD  ondansetron  (ZOFRAN -ODT) 4 MG disintegrating tablet Take 1 tablet (4 mg total) by mouth every 8 (eight) hours as needed. 07/09/23   Thedora Garnette HERO, MD  pantoprazole  (PROTONIX ) 40 MG tablet TAKE 1 TABLET BY MOUTH TWICE A DAY 05/23/23   Thedora Garnette HERO, MD  pregabalin  (LYRICA ) 150 MG capsule TAKE 1 CAPSULE BY MOUTH TWICE A DAY 04/11/24   Thedora Garnette HERO, MD  Semaglutide , 2 MG/DOSE, (OZEMPIC , 2 MG/DOSE,) 8 MG/3ML SOPN Inject 2 mg into the skin once a week. 01/02/24   Thedora Garnette HERO, MD  umeclidinium-vilanterol (ANORO ELLIPTA ) 62.5-25 MCG/ACT AEPB Inhale 1 puff into the lungs daily. 06/29/22   Thedora Garnette HERO, MD    Allergies: Codeine , Metformin  and related, Wellbutrin  [bupropion  hcl], and Acyclovir and related    Review of Systems  Constitutional:  Positive for chills and fatigue.  Respiratory:  Positive for cough, shortness of breath and wheezing.   Cardiovascular:  Negative for leg swelling.  Gastrointestinal:  Positive for diarrhea. Negative for vomiting.    Updated Vital Signs BP 118/69   Pulse (!) 114   Temp 99.1 F (37.3 C) (Oral)   Resp (!) 29   LMP 07/24/2014  Comment: pt has had ablation, but still has some periods  SpO2 (!) 85%   Physical Exam CONSTITUTIONAL: Ill-appearing HEAD: Normocephalic/atraumatic EYES: EOMI/PERRL ENMT: Mucous membranes moist, no stridor or drooling NECK: supple no meningeal signs CV: S1/S2 noted, no murmurs/rubs/gallops noted LUNGS: Mild tachypnea, scattered crackles noted, wheezing bilaterally ABDOMEN: soft, nontender, obese NEURO: Pt is awake/alert/appropriate, moves all extremitiesx4.  No facial droop.   EXTREMITIES: pulses normal/equal, full ROM, no lower extremity edema SKIN: warm, color normal  (all labs ordered are listed, but only abnormal results are displayed) Labs Reviewed  BASIC METABOLIC PANEL WITH GFR - Abnormal; Notable for the following components:      Result Value   Potassium 3.4 (*)    Glucose, Bld 181 (*)    All other components within normal limits  CBC - Abnormal; Notable for the following components:   WBC 16.0 (*)    RBC 5.22 (*)    MCH 25.7 (*)    RDW 16.1 (*)    All other components within normal limits  RESP PANEL BY RT-PCR (RSV, FLU A&B, COVID)  [id number]    EKG: EKG Interpretation Date/Time:  Tuesday April 22 2024 21:23:54 EST Ventricular Rate:  123 PR Interval:  150 QRS Duration:  147 QT Interval:  392 QTC Calculation: 561 R Axis:   -68  Text Interpretation: Sinus tachycardia Nonspecific IVCD with LAD Anterior infarct, old Interpretation limited secondary to artifact Confirmed by Midge Golas ([id number]) on 04/22/2024 11:23:46 PM  Radiology: [facility] Chest 2 View Result Date: 04/22/2024 CLINICAL DATA:  Shortness of breath.  Cough. EXAM: CHEST - 2 VIEW COMPARISON:  06/09/2022, CT 10/16/2023 FINDINGS: The cardiomediastinal contours are normal. Aortic atherosclerosis. Moderate peribronchial thickening. Subsegmental opacity in the lung bases. No confluent airspace disease. Pulmonary vasculature is normal. No pleural effusion, or pneumothorax. No acute osseous abnormalities are  seen. IMPRESSION: 1. Moderate peribronchial thickening, can be seen with bronchitis or asthma. 2. Subsegmental bibasilar opacities, favor atelectasis. Electronically Signed   By: Andrea Gasman M.D.   On: 04/22/2024 23:15    {  Document cardiac monitor, telemetry assessment procedure when appropriate:32947} .Critical Care  Performed by: Midge Golas, MD Authorized by: Midge Golas, MD   Critical care provider statement:    Critical care start time:  04/22/2024 11:50 PM   Critical care end time:  04/22/2024 11:50 PM   Critical care time was exclusive of:  Separately billable procedures and treating other patients   Critical care was necessary to treat or prevent imminent or life-threatening deterioration of the following conditions:  Respiratory failure   Critical care was time spent personally by me on the following activities:  Obtaining history from patient or surrogate, examination of patient, evaluation of patient's response to treatment, pulse oximetry, ordering and review of radiographic studies, ordering and review of laboratory studies, ordering and performing treatments and interventions, re-evaluation of patient's condition, review of old charts and development of treatment plan with patient or surrogate   I assumed direction of critical care for this patient from another provider in my specialty: no      Medications Ordered in the ED  albuterol  (PROVENTIL ) (2.5 MG/3ML) 0.083% nebulizer solution (has no administration in time range)  predniSONE  (DELTASONE ) tablet 60 mg (has no administration in time range)  cefTRIAXone  (ROCEPHIN ) 1 g in sodium chloride  0.9 % 100 mL IVPB (has no administration in time range)  azithromycin  (ZITHROMAX ) 500 mg in sodium chloride  0.9 % 250 mL IVPB (has no administration in time range)      {Click here for ABCD2, HEART and other calculators REFRESH Note before signing:1}                              Medical Decision Making Amount and/or  Complexity of Data Reviewed Labs: ordered. Radiology: ordered.  Risk Prescription drug management.   This patient presents to the ED for concern of shortness of breath, this involves an extensive number of treatment options, and is a complaint that carries with it a high risk of complications and morbidity.  The differential diagnosis includes but is not limited to Acute coronary syndrome, pneumonia, acute pulmonary edema, pneumothorax, acute anemia, pulmonary embolism   Comorbidities that complicate the patient evaluation: Patients presentation is complicated by their history of COPD  Social Determinants of Health: Patients ongoing tobacco use  increases the complexity of managing their presentation  Additional history obtained: Records reviewed previous admission documents  Lab Tests: I Ordered, and personally interpreted labs.  The pertinent results include: hyperGlycemia, leukocytosis  Imaging Studies ordered: I ordered imaging studies including X-ray chest  I independently visualized and interpreted imaging which showed bibasilar opacities I agree with the radiologist interpretation   Medicines ordered and prescription drug management: I ordered medication including albuterol  for wheezing Reevaluation of the patient after these medicines showed that the patient    {resolved/improved/worsened:23923::improved}   Critical Interventions:   albuterol  and antibiotics  Consultations Obtained: I requested consultation with the {consultation:26851}, and discussed  findings as well as pertinent plan - they recommend: ***  Reevaluation: After the interventions noted above, I reevaluated the patient and found that they have :{resolved/improved/worsened:23923::improved}  Complexity of problems addressed: Patients presentation is most consistent with  acute presentation with potential threat to life or bodily function  Disposition: After consideration of the diagnostic  results and the patients response to treatment,  I feel that the patent would benefit from {disposition:26850}.     {Document critical care time when appropriate  Document review of labs and clinical decision tools ie  CHADS2VASC2, etc  Document your independent review of radiology images and any outside records  Document your discussion with family members, caretakers and with consultants  Document social determinants of health affecting pt's care  Document your decision making why or why not admission, treatments were needed:32947:::1}   Final diagnoses:  None    ED Discharge Orders     None        "

## 2024-04-22 NOTE — ED Notes (Signed)
 Pt placed on 3L nc

## 2024-04-22 NOTE — Telephone Encounter (Signed)
 PT returned call. Stated that she has the flu and wants to cancel procedure on 1/5. She will call back to reschedule. Please advise.

## 2024-04-23 ENCOUNTER — Other Ambulatory Visit: Payer: Self-pay | Admitting: Family Medicine

## 2024-04-23 DIAGNOSIS — Z23 Encounter for immunization: Secondary | ICD-10-CM | POA: Diagnosis not present

## 2024-04-23 DIAGNOSIS — F32A Depression, unspecified: Secondary | ICD-10-CM | POA: Diagnosis present

## 2024-04-23 DIAGNOSIS — I2510 Atherosclerotic heart disease of native coronary artery without angina pectoris: Secondary | ICD-10-CM | POA: Diagnosis present

## 2024-04-23 DIAGNOSIS — B9789 Other viral agents as the cause of diseases classified elsewhere: Secondary | ICD-10-CM | POA: Diagnosis present

## 2024-04-23 DIAGNOSIS — J189 Pneumonia, unspecified organism: Secondary | ICD-10-CM | POA: Diagnosis present

## 2024-04-23 DIAGNOSIS — I152 Hypertension secondary to endocrine disorders: Secondary | ICD-10-CM | POA: Diagnosis present

## 2024-04-23 DIAGNOSIS — F909 Attention-deficit hyperactivity disorder, unspecified type: Secondary | ICD-10-CM | POA: Diagnosis present

## 2024-04-23 DIAGNOSIS — Z1152 Encounter for screening for COVID-19: Secondary | ICD-10-CM | POA: Diagnosis not present

## 2024-04-23 DIAGNOSIS — Z7985 Long-term (current) use of injectable non-insulin antidiabetic drugs: Secondary | ICD-10-CM | POA: Diagnosis not present

## 2024-04-23 DIAGNOSIS — J9621 Acute and chronic respiratory failure with hypoxia: Secondary | ICD-10-CM | POA: Diagnosis present

## 2024-04-23 DIAGNOSIS — E876 Hypokalemia: Secondary | ICD-10-CM | POA: Diagnosis present

## 2024-04-23 DIAGNOSIS — G4733 Obstructive sleep apnea (adult) (pediatric): Secondary | ICD-10-CM | POA: Diagnosis present

## 2024-04-23 DIAGNOSIS — J440 Chronic obstructive pulmonary disease with acute lower respiratory infection: Secondary | ICD-10-CM | POA: Diagnosis present

## 2024-04-23 DIAGNOSIS — Z9981 Dependence on supplemental oxygen: Secondary | ICD-10-CM | POA: Diagnosis not present

## 2024-04-23 DIAGNOSIS — K219 Gastro-esophageal reflux disease without esophagitis: Secondary | ICD-10-CM | POA: Diagnosis present

## 2024-04-23 DIAGNOSIS — J4489 Other specified chronic obstructive pulmonary disease: Secondary | ICD-10-CM

## 2024-04-23 DIAGNOSIS — Z8249 Family history of ischemic heart disease and other diseases of the circulatory system: Secondary | ICD-10-CM | POA: Diagnosis not present

## 2024-04-23 DIAGNOSIS — J441 Chronic obstructive pulmonary disease with (acute) exacerbation: Secondary | ICD-10-CM | POA: Diagnosis present

## 2024-04-23 DIAGNOSIS — F17210 Nicotine dependence, cigarettes, uncomplicated: Secondary | ICD-10-CM | POA: Diagnosis present

## 2024-04-23 DIAGNOSIS — Z833 Family history of diabetes mellitus: Secondary | ICD-10-CM | POA: Diagnosis not present

## 2024-04-23 DIAGNOSIS — E1165 Type 2 diabetes mellitus with hyperglycemia: Secondary | ICD-10-CM | POA: Diagnosis present

## 2024-04-23 DIAGNOSIS — Z6841 Body Mass Index (BMI) 40.0 and over, adult: Secondary | ICD-10-CM | POA: Diagnosis not present

## 2024-04-23 DIAGNOSIS — E1142 Type 2 diabetes mellitus with diabetic polyneuropathy: Secondary | ICD-10-CM | POA: Diagnosis present

## 2024-04-23 DIAGNOSIS — F419 Anxiety disorder, unspecified: Secondary | ICD-10-CM | POA: Diagnosis present

## 2024-04-23 DIAGNOSIS — J9811 Atelectasis: Secondary | ICD-10-CM | POA: Diagnosis present

## 2024-04-23 LAB — CBG MONITORING, ED
Glucose-Capillary: 295 mg/dL — ABNORMAL HIGH (ref 70–99)
Glucose-Capillary: 303 mg/dL — ABNORMAL HIGH (ref 70–99)
Glucose-Capillary: 303 mg/dL — ABNORMAL HIGH (ref 70–99)

## 2024-04-23 LAB — CREATININE, SERUM
Creatinine, Ser: 0.74 mg/dL (ref 0.44–1.00)
GFR, Estimated: >60 mL/min

## 2024-04-23 LAB — LACTIC ACID, PLASMA: Lactic Acid, Venous: 2.9 mmol/L (ref 0.5–1.9)

## 2024-04-23 LAB — CBC
HCT: 41.5 % (ref 36.0–46.0)
Hemoglobin: 12.8 g/dL (ref 12.0–15.0)
MCH: 25.8 pg — ABNORMAL LOW (ref 26.0–34.0)
MCHC: 30.8 g/dL (ref 30.0–36.0)
MCV: 83.5 fL (ref 80.0–100.0)
Platelets: 191 K/uL (ref 150–400)
RBC: 4.97 MIL/uL (ref 3.87–5.11)
RDW: 16.2 % — ABNORMAL HIGH (ref 11.5–15.5)
WBC: 12.5 K/uL — ABNORMAL HIGH (ref 4.0–10.5)
nRBC: 0 % (ref 0.0–0.2)

## 2024-04-23 LAB — HIV ANTIBODY (ROUTINE TESTING W REFLEX): HIV Screen 4th Generation wRfx: NONREACTIVE

## 2024-04-23 MED ORDER — IPRATROPIUM-ALBUTEROL 0.5-2.5 (3) MG/3ML IN SOLN: 3.0000 mL | RESPIRATORY_TRACT | Status: DC | PRN

## 2024-04-23 MED ORDER — DEXAMETHASONE SOD PHOSPHATE PF 10 MG/ML IJ SOLN
10.0000 mg | Freq: Once | INTRAMUSCULAR | Status: AC
  Administered 2024-04-23: 10 mg via INTRAVENOUS

## 2024-04-23 MED ORDER — NICOTINE 14 MG/24HR TD PT24
14.0000 mg | MEDICATED_PATCH | Freq: Once | TRANSDERMAL | Status: AC
  Administered 2024-04-23: 14 mg via TRANSDERMAL
  Filled 2024-04-23: qty 1

## 2024-04-23 MED ORDER — POLYETHYLENE GLYCOL 3350 17 G PO PACK: 17.0000 g | PACK | Freq: Every day | ORAL | Status: DC | PRN

## 2024-04-23 MED ORDER — INSULIN ASPART 100 UNIT/ML IJ SOLN
0.0000 [IU] | Freq: Three times a day (TID) | INTRAMUSCULAR | Status: DC
  Administered 2024-04-23: 7 [IU] via SUBCUTANEOUS
  Administered 2024-04-23: 5 [IU] via SUBCUTANEOUS
  Filled 2024-04-23: qty 7
  Filled 2024-04-23: qty 5

## 2024-04-23 MED ORDER — MELATONIN 5 MG PO TABS: 5.0000 mg | ORAL_TABLET | Freq: Every evening | ORAL | Status: DC | PRN

## 2024-04-23 MED ORDER — UMECLIDINIUM-VILANTEROL 62.5-25 MCG/ACT IN AEPB
1.0000 | INHALATION_SPRAY | Freq: Every day | RESPIRATORY_TRACT | Status: DC
  Administered 2024-04-24 – 2024-04-28 (×5): 1 via RESPIRATORY_TRACT
  Filled 2024-04-23: qty 14

## 2024-04-23 MED ORDER — INSULIN GLARGINE-YFGN 100 UNIT/ML ~~LOC~~ SOLN
20.0000 [IU] | Freq: Every day | SUBCUTANEOUS | Status: DC
  Administered 2024-04-23 – 2024-04-27 (×5): 20 [IU] via SUBCUTANEOUS
  Filled 2024-04-23 (×5): qty 0.2

## 2024-04-23 MED ORDER — METHYLPREDNISOLONE SODIUM SUCC 40 MG IJ SOLR
40.0000 mg | Freq: Every day | INTRAMUSCULAR | Status: AC
  Administered 2024-04-24 – 2024-04-27 (×4): 40 mg via INTRAVENOUS
  Filled 2024-04-23 (×4): qty 1

## 2024-04-23 MED ORDER — SODIUM CHLORIDE 0.9 % IV SOLN: 500.0000 mg | INTRAVENOUS | Status: DC

## 2024-04-23 MED ORDER — PREGABALIN 75 MG PO CAPS
150.0000 mg | ORAL_CAPSULE | Freq: Two times a day (BID) | ORAL | Status: DC
  Administered 2024-04-23 – 2024-04-28 (×11): 150 mg via ORAL
  Filled 2024-04-23 (×3): qty 2
  Filled 2024-04-23 (×3): qty 3
  Filled 2024-04-23 (×5): qty 2

## 2024-04-23 MED ORDER — ATORVASTATIN CALCIUM 20 MG PO TABS
20.0000 mg | ORAL_TABLET | Freq: Two times a day (BID) | ORAL | Status: DC
  Administered 2024-04-23 – 2024-04-28 (×11): 20 mg via ORAL
  Filled 2024-04-23: qty 1
  Filled 2024-04-23: qty 2
  Filled 2024-04-23 (×7): qty 1
  Filled 2024-04-23 (×2): qty 2

## 2024-04-23 MED ORDER — AZITHROMYCIN 250 MG PO TABS
500.0000 mg | ORAL_TABLET | Freq: Every day | ORAL | Status: AC
  Administered 2024-04-24 – 2024-04-27 (×4): 500 mg via ORAL
  Filled 2024-04-23 (×4): qty 2

## 2024-04-23 MED ORDER — IPRATROPIUM-ALBUTEROL 0.5-2.5 (3) MG/3ML IN SOLN
3.0000 mL | Freq: Once | RESPIRATORY_TRACT | Status: AC
  Administered 2024-04-23: 3 mL via RESPIRATORY_TRACT
  Filled 2024-04-23: qty 3

## 2024-04-23 MED ORDER — ACETAMINOPHEN 500 MG PO TABS
500.0000 mg | ORAL_TABLET | Freq: Four times a day (QID) | ORAL | Status: DC | PRN
  Administered 2024-04-25 – 2024-04-26 (×3): 500 mg via ORAL
  Filled 2024-04-23 (×3): qty 1

## 2024-04-23 MED ORDER — POTASSIUM CHLORIDE CRYS ER 20 MEQ PO TBCR
20.0000 meq | EXTENDED_RELEASE_TABLET | Freq: Two times a day (BID) | ORAL | Status: AC
  Administered 2024-04-23 (×2): 20 meq via ORAL
  Filled 2024-04-23 (×2): qty 1

## 2024-04-23 MED ORDER — DULOXETINE HCL 60 MG PO CPEP
60.0000 mg | ORAL_CAPSULE | Freq: Every day | ORAL | Status: DC
  Administered 2024-04-23 – 2024-04-28 (×6): 60 mg via ORAL
  Filled 2024-04-23: qty 1
  Filled 2024-04-23 (×2): qty 2
  Filled 2024-04-23 (×3): qty 1

## 2024-04-23 MED ORDER — OSELTAMIVIR PHOSPHATE 75 MG PO CAPS
75.0000 mg | ORAL_CAPSULE | Freq: Two times a day (BID) | ORAL | Status: DC
  Administered 2024-04-23: 75 mg via ORAL
  Filled 2024-04-23: qty 1

## 2024-04-23 MED ORDER — ARIPIPRAZOLE 5 MG PO TABS
5.0000 mg | ORAL_TABLET | Freq: Every day | ORAL | Status: DC
  Administered 2024-04-23 – 2024-04-28 (×6): 5 mg via ORAL
  Filled 2024-04-23 (×6): qty 1

## 2024-04-23 MED ORDER — PANTOPRAZOLE SODIUM 40 MG PO TBEC
40.0000 mg | DELAYED_RELEASE_TABLET | Freq: Two times a day (BID) | ORAL | Status: DC
  Administered 2024-04-23 – 2024-04-28 (×11): 40 mg via ORAL
  Filled 2024-04-23 (×11): qty 1

## 2024-04-23 MED ORDER — INSULIN ASPART 100 UNIT/ML IJ SOLN
0.0000 [IU] | Freq: Three times a day (TID) | INTRAMUSCULAR | Status: DC
  Administered 2024-04-24: 4 [IU] via SUBCUTANEOUS
  Administered 2024-04-24: 3 [IU] via SUBCUTANEOUS
  Administered 2024-04-24: 11 [IU] via SUBCUTANEOUS
  Administered 2024-04-25 (×2): 4 [IU] via SUBCUTANEOUS
  Administered 2024-04-25: 7 [IU] via SUBCUTANEOUS
  Administered 2024-04-26: 4 [IU] via SUBCUTANEOUS
  Administered 2024-04-26: 15 [IU] via SUBCUTANEOUS
  Administered 2024-04-27: 11 [IU] via SUBCUTANEOUS
  Administered 2024-04-27: 4 [IU] via SUBCUTANEOUS
  Filled 2024-04-23: qty 4
  Filled 2024-04-23: qty 11
  Filled 2024-04-23: qty 15
  Filled 2024-04-23: qty 3
  Filled 2024-04-23: qty 11
  Filled 2024-04-23: qty 4
  Filled 2024-04-23: qty 7
  Filled 2024-04-23 (×2): qty 4

## 2024-04-23 MED ORDER — ENOXAPARIN SODIUM 40 MG/0.4ML IJ SOSY
40.0000 mg | PREFILLED_SYRINGE | INTRAMUSCULAR | Status: DC
  Administered 2024-04-23 – 2024-04-28 (×6): 40 mg via SUBCUTANEOUS
  Filled 2024-04-23 (×6): qty 0.4

## 2024-04-23 MED ORDER — OXYCODONE HCL 5 MG PO TABS
5.0000 mg | ORAL_TABLET | Freq: Once | ORAL | Status: AC
  Administered 2024-04-23: 5 mg via ORAL
  Filled 2024-04-23: qty 1

## 2024-04-23 MED ORDER — INSULIN ASPART 100 UNIT/ML IJ SOLN
0.0000 [IU] | Freq: Every day | INTRAMUSCULAR | Status: DC
  Administered 2024-04-23: 4 [IU] via SUBCUTANEOUS
  Filled 2024-04-23: qty 4

## 2024-04-23 MED ORDER — AMLODIPINE BESYLATE 10 MG PO TABS
5.0000 mg | ORAL_TABLET | Freq: Every day | ORAL | Status: DC
  Administered 2024-04-23 – 2024-04-26 (×4): 5 mg via ORAL
  Filled 2024-04-23 (×4): qty 1

## 2024-04-23 MED ORDER — SODIUM CHLORIDE 0.9 % IV SOLN: 2.0000 g | INTRAVENOUS | Status: DC

## 2024-04-23 MED ORDER — IPRATROPIUM-ALBUTEROL 0.5-2.5 (3) MG/3ML IN SOLN
3.0000 mL | Freq: Four times a day (QID) | RESPIRATORY_TRACT | Status: DC
  Administered 2024-04-23 – 2024-04-26 (×15): 3 mL via RESPIRATORY_TRACT
  Filled 2024-04-23 (×16): qty 3

## 2024-04-23 MED ORDER — CLONAZEPAM 0.5 MG PO TABS: 0.5000 mg | ORAL_TABLET | Freq: Two times a day (BID) | ORAL | Status: DC | PRN

## 2024-04-23 MED ORDER — GUAIFENESIN-DM 100-10 MG/5ML PO SYRP
5.0000 mL | ORAL_SOLUTION | ORAL | Status: DC | PRN
  Administered 2024-04-24 – 2024-04-25 (×6): 5 mL via ORAL
  Filled 2024-04-23 (×6): qty 10

## 2024-04-23 MED ORDER — PROCHLORPERAZINE EDISYLATE 10 MG/2ML IJ SOLN: 5.0000 mg | Freq: Four times a day (QID) | INTRAMUSCULAR | Status: DC | PRN

## 2024-04-23 MED ORDER — INSULIN ASPART 100 UNIT/ML IJ SOLN
6.0000 [IU] | Freq: Three times a day (TID) | INTRAMUSCULAR | Status: DC
  Administered 2024-04-24 – 2024-04-28 (×13): 6 [IU] via SUBCUTANEOUS
  Filled 2024-04-23 (×13): qty 6

## 2024-04-23 MED ORDER — ALBUTEROL SULFATE (2.5 MG/3ML) 0.083% IN NEBU: 2.5000 mg | INHALATION_SOLUTION | RESPIRATORY_TRACT | Status: DC | PRN

## 2024-04-23 NOTE — Assessment & Plan Note (Addendum)
 Patient is not on home O2, presented with O2 sats to 79% On 10 L high flow nasal cannula in the ER Wean off O2 supplementation as tolerated Continues to smoke, cessation encouraged CXR with moderate peribronchial thickening, likely viral in nature Given Ceftriaxone  and IV azithromycin  x 1 in ER; will complete 5 days of azithromycin  but change to PO Flu negative, stop Tamiflu  Received IV Decadron -> Solu-Medrol  to complete 5 total days DuoNebs every 6 hours and Albuterol  every 2 hours as needed for shortness of breath and wheezing As needed antitussives Incentive spirometer Early mobilization Will check RVP Resume Anoro (has been out of this medication and is requesting a refill at time of dc) Admit to progressive care unit

## 2024-04-23 NOTE — Assessment & Plan Note (Signed)
"  Continue atorvastatin   "

## 2024-04-23 NOTE — Telephone Encounter (Unsigned)
 Copied from CRM #[id number]. Topic: Clinical - Medication Refill >> Apr 23, 2024  2:45 PM Leah C wrote: Medication: umeclidinium-vilanterol (ANORO ELLIPTA ) 62.5-25 MCG/ACT 1 puff    Has the patient contacted their pharmacy? Nathanel from Pharmacy called at [facility] to put in the request for the patient. Patient is currently admitted in the hospital.   (Agent: If no, request that the patient contact the pharmacy for the refill. If patient does not wish to contact the pharmacy document the reason why and proceed with request.) (Agent: If yes, when and what did the pharmacy advise?)  This is the patient's preferred pharmacy:  [facility] [phone] [address], [place] - [address] [place] [address] Phone: [phone] Fax: [phone]  Is this the correct pharmacy for this prescription? Yes If no, delete pharmacy and type the correct one.   Has the prescription been filled recently? Yes  Is the patient out of the medication? Yes  Has the patient been seen for an appointment in the last year OR does the patient have an upcoming appointment? Yes  Can we respond through MyChart? Yes  Agent: Please be advised that Rx refills may take up to 3 business days. We ask that you follow-up with your pharmacy.

## 2024-04-23 NOTE — Assessment & Plan Note (Addendum)
 No antihypertensives seen on med rec Added Norvasc  5 mg daily BP currently 137/61

## 2024-04-23 NOTE — ED Notes (Signed)
 Breakfast tray given to patient.

## 2024-04-23 NOTE — Progress Notes (Signed)
" °   04/23/24 2322  BiPAP/CPAP/SIPAP  BiPAP/CPAP/SIPAP Pt Type Adult  Reason BIPAP/CPAP not in use Other(comment) (Patient on high level of O2 via HFNC.  She does not want to wear CPAP tonight.)    "

## 2024-04-23 NOTE — Assessment & Plan Note (Signed)
 Hold medications - she will not be performing tasks that require attention and focus as an inpatient  Continue Klonopin  as needed for anxiety Continue Abilify , Duloxetine 

## 2024-04-23 NOTE — Assessment & Plan Note (Addendum)
-  Encourage cessation.   ?-This was discussed with the patient and should be reviewed on an ongoing basis.   ?-Patch ordered  ?

## 2024-04-23 NOTE — H&P (Addendum)
 " History and Physical  [name] FMW:[id number] DOB: 1965-07-28 DOA: 04/22/2024  Referring physician: Dr. Midge, EDP  PCP: Thedora Garnette HERO, MD  Outpatient Specialists: GI, orthopedic surgery, behavioral health. Patient coming from: Home.  Chief Complaint: Shortness of breath.  HPI: [name] is a 58 y.o. female with medical history significant for COPD, OSA on CPAP, coronary artery disease, hyperlipidemia, type 2 diabetes, diabetic polyneuropathy, ADHD, current smoker 1 pack/day, severe morbid obesity, GERD, gastric erosions, who presents to the ER due to sudden onset shortness of breath, cough, body aches, nasal congestion, headaches, diarrhea, since Sunday, 04/20/2024.  The patient states she attended a party with family members the day prior to onset of symptoms, on Saturday.  One of the family members appeared sick with upper respiratory symptoms.  She is concerned for possible influenza exposure.  She presented to the ER for further evaluation.  In the ER, tachycardic, tachypneic, and hypoxic with O2 saturation in the 80s on room air.  Not on oxygen  supplementation at baseline.  Influenza A&B, COVID-19, RSV by PCR all negative.  A chest x-ray revealed moderate peribronchial thickening which can be seen with bronchitis or asthma.  Subsegmental bibasilar opacities favor atelectasis.    Due to concern for early pneumonia and acute COPD exacerbation, the patient received Rocephin  and IV azithromycin , prednisone  60 mg x 1, DuoNebs x 1 in the ER.  TRH was asked to admit for further management of acute COPD exacerbation and acute hypoxic respiratory failure.  ED Course: Temperature 97.5.  BP 146/100, pulse 106, respiration rate 24.  O2 saturation 95% on 10 high flow nasal cannula.  Review of Systems: Review of systems as noted in the HPI. All other systems reviewed and are negative.   Past Medical History:  Diagnosis Date   ADHD    Alcohol abuse    Anxiety     Asthmatic bronchitis    normal PFT/ seen by pulmonary no evidence of COPD   Atypical chest pain 10/05/2017   Back pain    Bilateral swelling of feet    Chest pain    Chest pain on respiration 03/25/2014   Chronic bronchitis (HCC)    Chronic lower back pain    Chronic respiratory failure (HCC) 10/16/2011   Newly 02 dep 24/7 p discharge from [facility] 01/2013  - 03/13/2014  Walked RA  2 laps @ 185 ft each stopped due to  Sob/ aching in legs, thirsty/ no desat @ slow pace    Chronic respiratory failure with hypoxia (HCC)    On 2-3 L of oxygen  at home   Cigarette smoker 12/02/2010   Followed in Pulmonary clinic/ [facility]/ [place]   - Limits of effective care reviewed 12/22/2011     Complication of anesthesia    Constipation    COPD (chronic obstructive pulmonary disease) (HCC)    COPD with chronic bronchitis (HCC) 10/01/2017   Daily headache    Depression    Diabetic peripheral neuropathy (HCC)    DM (diabetes mellitus) type II controlled, neurological manifestation (HCC) 12/02/2010   Drug use    Gallbladder problem    Gastric erosions    EGD 08/2010.   GERD (gastroesophageal reflux disease)    Glaucoma    H/O drug abuse (HCC) 11/12/2017   -- scanned document from outside source: [facility] in [place]  which showed UDS positive for Adderall /amphetamine  usage as well as positive urine for THC.  This test result was collected  08/11/2016 and reported 10/07/2016. - lso review of the chart shows another positive amphetamine , THC and METH in the urine back on 10/18/2015 under care everywhere. ---So    Heavy menses    High cholesterol    History of blood transfusion    related to low HgB (10/05/2017)   History of hiatal hernia    HTN (hypertension)    Hyperlipidemia associated with type 2 diabetes mellitus (HCC) 10/18/2015   Hypertension associated with diabetes (HCC) 12/02/2010   D/c acei 12/22/2011 due to psuedowheeze and narcotic dependent  cough> ? Improved - 03/14/2014 started bystolic in place of cozar due to cough     IBS (irritable bowel syndrome)    Increased urinary protein excretion    Internal hemorrhoids    Colonoscopy 5/12.   Joint pain    Mixed diabetic hyperlipidemia associated with type 2 diabetes mellitus (HCC) 10/01/2017   On home oxygen  therapy    5L at night (10/05/2017)   OSA on CPAP    Osteoarthritis    back (10/05/2017)   Oxygen  dependent 10/16/2011   Pneumonia    lots of times (10/05/2017)   PONV (postoperative nausea and vomiting)    Poorly controlled diabetes mellitus (HCC) 11/12/2017   Pulmonary infiltrates 12/22/2011   Followed in Pulmonary clinic/ [facility]/ [place]    - See CT Chest 05/05/11    Shortness of breath    Tachycardia    never had test done since no insurance   Tobacco use disorder-current smoker greater than 40-pack-year history- since age 28 1 ppd 10/01/2017   Type II diabetes mellitus (HCC)    Vitamin D  deficiency    Past Surgical History:  Procedure Laterality Date   cataract surgery Left 03/18/2023   CESAREAN SECTION  1988; 1989   CHOLECYSTECTOMY OPEN  1990   COLONOSCOPY  09/16/2010   DOQ:wnmfjo colon/small internal hemorrhoids   ESOPHAGOGASTRODUODENOSCOPY  09/16/2010   SLF: normal/mild gastritis   ESOPHAGOGASTRODUODENOSCOPY N/A 10/19/2014   Procedure: ESOPHAGOGASTRODUODENOSCOPY (EGD);  Surgeon: Margo LITTIE Haddock, MD;  Location: [facility] SUITE;  Service: Endoscopy;  Laterality: N/A;  830   FRACTURE SURGERY     HYSTEROSCOPY WITH THERMACHOICE  01/17/2012   Procedure: HYSTEROSCOPY WITH THERMACHOICE;  Surgeon: Vonn VEAR Inch, MD;  Location: [facility] ORS;  Service: Gynecology;  Laterality: N/A;  total therapy time: 9:13sec  D5W  18 ml in, D5W   18ml out, temperture 87degrees celcious   KIDNEY SURGERY     as child for blockages   LEFT HEART CATH AND CORONARY ANGIOGRAPHY N/A 09/08/2019   Procedure: LEFT HEART CATH AND CORONARY ANGIOGRAPHY;  Surgeon: Jordan, Peter M, MD;   Location: [facility] INVASIVE CV LAB;  Service: Cardiovascular;  Laterality: N/A;   TONSILLECTOMY     TUBAL LIGATION  1989   TYMPANOSTOMY TUBE PLACEMENT Bilateral    several times when I was a child   uterine ablation     WRIST FRACTURE SURGERY Left 1995    Social History:  reports that she has been smoking cigarettes. She started smoking about 46 years ago. She has a 69 pack-year smoking history. She has never used smokeless tobacco. She reports that she does not currently use alcohol. She reports current drug use. Drug: Marijuana.   Allergies[1]  Family History  Problem Relation Age of Onset   Heart attack Mother 22       deceased   Diabetes Mother    Breast cancer Mother 47       inflammatory breast ca  Heart failure Mother        oxygen  dependence, nonsmoker   Heart disease Mother    Depression Mother    Cancer Mother        Breast   Hypertension Mother    Hyperlipidemia Mother    Sudden death Mother    Sleep apnea Mother    Obesity Mother    Heart attack Father 9       deceased, etoh use   Heart disease Father    Alcohol abuse Father    Depression Father    Heart failure Father    Sudden death Father    Ulcers Sister    Hypertension Sister    Heart failure Sister    Depression Sister    Anxiety disorder Sister    Liver disease Maternal Aunt 40       died while on liver transplant list   Liver disease Maternal Uncle    Cancer Paternal Aunt        Breast   Heart disease Maternal Grandmother    Heart attack Maternal Grandmother        premature CAD   Colon cancer Neg Hx       Prior to Admission medications  Medication Sig Start Date End Date Taking? Authorizing Provider  ACCU-CHEK GUIDE test strip USE UP TO FOUR TIMES DAILY AS DIRECTED 08/18/19   Cirigliano, Ronal POUR, DO  Accu-Chek Softclix Lancets lancets USE UP TO FOUR TIMES DAILY AS DIRECTED 06/20/19   Cirigliano, Mary K, DO  albuterol  (PROVENTIL ) (2.5 MG/3ML) 0.083% nebulizer solution Take 3 mLs (2.5 mg  total) by nebulization every 6 (six) hours as needed for wheezing or shortness of breath. 01/14/24 03/10/24  Thedora Garnette HERO, MD  albuterol  (VENTOLIN  HFA) 108 [phone] Base) MCG/ACT inhaler Inhale 2 puffs into the lungs every 6 (six) hours as needed for wheezing or shortness of breath. 06/20/22   Thedora Garnette HERO, MD  amphetamine -dextroamphetamine  (ADDERALL) 30 MG tablet Take 1 tablet by mouth 2 (two) times daily. 02/25/24 02/24/25  Okey Barnie SAUNDERS, MD  ARIPiprazole  (ABILIFY ) 5 MG tablet Take 1 tablet (5 mg total) by mouth daily. 02/25/24 02/24/25  Okey Barnie SAUNDERS, MD  atorvastatin  (LIPITOR) 40 MG tablet TAKE 0.5 TABLETS (20 MG TOTAL) BY MOUTH 2 (TWO) TIMES DAILY. 09/06/23   Thedora Garnette HERO, MD  bismuth subsalicylate (PEPTO BISMOL) 262 MG/15ML suspension Take 30 mLs by mouth every 6 (six) hours as needed for indigestion.    [provider]  blood glucose meter kit and supplies Dispense based on patient and insurance preference. Use up to four times daily as directed. (FOR ICD-10 E10.9, E11.9). 06/20/19   Cirigliano, Ronal POUR, DO  clonazePAM  (KLONOPIN ) 0.5 MG tablet Take 1 tablet (0.5 mg total) by mouth 2 (two) times daily as needed for anxiety. 02/25/24   Okey Barnie SAUNDERS, MD  DULoxetine  (CYMBALTA ) 60 MG capsule Take 1 capsule (60 mg total) by mouth in the morning. 02/25/24   Okey Barnie SAUNDERS, MD  fluticasone  (FLONASE ) 50 MCG/ACT nasal spray PLACE 1 SPRAY INTO BOTH NOSTRILS 2 (TWO) TIMES DAILY AS NEEDED FOR ALLERGIES OR RHINITIS (AFTER SINUS RINSE). 01/26/22   Thedora Garnette HERO, MD  ibuprofen  (ADVIL ) 200 MG tablet Take 600 mg by mouth every 6 (six) hours as needed for mild pain or headache.    [provider]  insulin  glargine (LANTUS  SOLOSTAR) 100 UNIT/ML Solostar Pen Inject 20 Units into the skin daily. 12/10/23   Thedora Garnette HERO, MD  Insulin  Pen  Needle (BD PEN NEEDLE NANO 2ND GEN) 32G X 4 MM MISC USE DAILY WITH VICTOZA  01/14/24   Thedora Garnette HERO, MD  Insulin  Syringe-Needle U-100 (INSULIN  SYRINGE  .5CC/31GX5/16) 31G X 5/16 0.5 ML MISC Inject 1 Units into the muscle daily. use as directed 04/02/23   Thedora Garnette HERO, MD  ipratropium-albuterol  (DUONEB) 0.5-2.5 (3) MG/3ML SOLN Take 3 mLs by nebulization 3 (three) times daily. 06/20/22   Thedora Garnette HERO, MD  meloxicam  (MOBIC ) 15 MG tablet Take 1 tablet (15 mg total) by mouth daily. 03/28/24   Thedora Garnette HERO, MD  ondansetron  (ZOFRAN -ODT) 4 MG disintegrating tablet Take 1 tablet (4 mg total) by mouth every 8 (eight) hours as needed. 07/09/23   Thedora Garnette HERO, MD  pantoprazole  (PROTONIX ) 40 MG tablet TAKE 1 TABLET BY MOUTH TWICE A DAY 05/23/23   Thedora Garnette HERO, MD  pregabalin  (LYRICA ) 150 MG capsule TAKE 1 CAPSULE BY MOUTH TWICE A DAY 04/11/24   Thedora Garnette HERO, MD  Semaglutide , 2 MG/DOSE, (OZEMPIC , 2 MG/DOSE,) 8 MG/3ML SOPN Inject 2 mg into the skin once a week. 01/02/24   Thedora Garnette HERO, MD  umeclidinium-vilanterol (ANORO ELLIPTA ) 62.5-25 MCG/ACT AEPB Inhale 1 puff into the lungs daily. 06/29/22   Thedora Garnette HERO, MD    Physical Exam: BP (!) 160/60   Pulse 96   Temp 99 F (37.2 C) (Oral)   Resp 19   LMP 07/24/2014 Comment: pt has had ablation, but still has some periods  SpO2 94%   General: 58 y.o. year-old female well developed well nourished in no acute distress.  Alert and oriented x3. Cardiovascular: Regular rate and rhythm with no rubs or gallops.  No thyromegaly or JVD noted.  No lower extremity edema. 2/4 pulses in all 4 extremities. Respiratory: Diffuse wheezing.  Mild rales at bases.  Poor inspiratory effort. Abdomen: Soft nontender nondistended with normal bowel sounds x4 quadrants. Muskuloskeletal: No cyanosis, clubbing or edema noted bilaterally Neuro: CN II-XII intact, strength, sensation, reflexes Skin: No ulcerative lesions noted or rashes Psychiatry: Judgement and insight appear normal. Mood is appropriate for condition and setting          Labs on Admission:  Basic Metabolic Panel: Recent Labs  Lab 04/22/24 2134  NA  136  K 3.4*  CL 100  CO2 24  GLUCOSE 181*  BUN 7  CREATININE 0.78  CALCIUM  9.2   Liver Function Tests: No results for input(s): AST, ALT, ALKPHOS, BILITOT, PROT, ALBUMIN in the last 168 hours. No results for input(s): LIPASE, AMYLASE in the last 168 hours. No results for input(s): AMMONIA in the last 168 hours. CBC: Recent Labs  Lab 04/22/24 2134  WBC 16.0*  HGB 13.4  HCT 43.8  MCV 83.9  PLT 252   Cardiac Enzymes: No results for input(s): CKTOTAL, CKMB, CKMBINDEX, TROPONINI in the last 168 hours.  BNP (last 3 results) No results for input(s): BNP in the last 8760 hours.  ProBNP (last 3 results) No results for input(s): PROBNP in the last 8760 hours.  CBG: No results for input(s): GLUCAP in the last 168 hours.  Radiological Exams on Admission: DG Chest 2 View Result Date: 04/22/2024 CLINICAL DATA:  Shortness of breath.  Cough. EXAM: CHEST - 2 VIEW COMPARISON:  06/09/2022, CT 10/16/2023 FINDINGS: The cardiomediastinal contours are normal. Aortic atherosclerosis. Moderate peribronchial thickening. Subsegmental opacity in the lung bases. No confluent airspace disease. Pulmonary vasculature is normal. No pleural effusion, or pneumothorax. No acute osseous abnormalities are seen. IMPRESSION: 1. Moderate peribronchial  thickening, can be seen with bronchitis or asthma. 2. Subsegmental bibasilar opacities, favor atelectasis. Electronically Signed   By: Andrea Gasman M.D.   On: 04/22/2024 23:15    EKG: I independently viewed the EKG done and my findings are as followed: Sinus tachycardia rate of 123.  QTc 561.  Assessment/Plan Present on Admission:  Acute exacerbation of chronic obstructive pulmonary disease (COPD) (HCC)  Principal Problem:   Acute exacerbation of chronic obstructive pulmonary disease (COPD) (HCC)  Acute exacerbation of COPD secondary to community-acquired pneumonia and possible influenza A viral infection, POA Current  smoker, 1 pack/day Continue Rocephin  and IV azithromycin  Added Tamiflu  75 mg twice daily x 5 days, due to concern for influenza A exposure Received IV Decadron 10 mg x 1 today IV Solu-Medrol  40 mg daily x 4 days starting tomorrow. DuoNebs every 6 hours and every 2 hours as needed for shortness of breath and wheezing. As needed antitussives Incentive spirometer Early mobilization  Acute hypoxic respiratory failure secondary to the above Not on oxygen  supplementation at baseline On 10 L high flow nasal cannula in the ER Wean off O2 supplementation as tolerated Continue management as stated above. Home O2 evaluation on 04/23/2024  Tobacco use disorder Smokes 1 pack/day Tobacco cessation counseled on at bedside  Type 2 diabetes with hyperglycemia Last hemoglobin A1c 7.0 on 01/14/2024 Heart healthy carb modified diet Insulin  coverage  Elevated blood pressures No antihypertensives seen on med rec Added Norvasc  5 mg daily Continue to closely monitor vital signs  Severe morbid obesity Weight 119 kg Recommend weight loss outpatient with regular physical activity and healthy dieting.  Hypokalemia Serum potassium 3.4 Repleted orally. Check magnesium  level. Follow repeat BMP in the morning  Hyperlipidemia Diabetic polyneuropathy Chronic anxiety/depression GERD/gastric erosions Resume home regimen  OSA CPAP nightly   Critical care time: 55 minutes.   DVT prophylaxis: Subcu Lovenox  daily.  Code Status: Full code.  Family Communication: None at bedside.  Disposition Plan: Admitted to stepdown unit.  Consults called: None.  Admission status: Inpatient status.   Status is: Inpatient The patient requires at least 2 midnights for further evaluation and treatment of present condition.   Terry LOISE Hurst MD [facility] Pager [phone]  If 7PM-7AM, please contact night-coverage [email or web address] Password [email or web address]  04/23/2024, 3:58 AM      [1]   Allergies Allergen Reactions   Codeine  Itching and Nausea Only   Metformin  And Related Diarrhea, Nausea Only and Other (See Comments)    Stomach pain/nausea   Wellbutrin  [Bupropion  Hcl] Hives   Acyclovir And Related Rash   "

## 2024-04-23 NOTE — Assessment & Plan Note (Addendum)
 Weight pending Weight loss should be encouraged on an ongoing basis Hold Ozempic  while inpatient Outpatient PCP/bariatric medicine f/u encouraged Significantly low or high BMI is associated with higher medical risk including morbidity and mortality

## 2024-04-23 NOTE — Telephone Encounter (Signed)
 Patient currently hospitalized with Flu A. Appointment has been cancelled for 04/28/24 procedure.

## 2024-04-23 NOTE — Progress Notes (Addendum)
 " Progress Note   Patient: [name] FMW:[id number] DOB: [birth year] DOA: 04/22/2024     0 DOS: the patient was seen and examined on 04/23/2024   Brief hospital course: 58yo with h/o COPD not on home O2, OSA on CPAP, CAD, HLD, T2DM, and morbid obesity who presented on 12/30 with SOB.  O2 in 80s on room air.  COVID/flu/RSV neative.  CXR with moderate peribronchial thickening.  Started on Ceftriaxone  and Azithromycin , prednisone , Duonebs.  She is currently on 10L HFNC O2.  Assessment and Plan:  Assessment & Plan Acute exacerbation of chronic obstructive pulmonary disease (COPD) (HCC) Acute on chronic hypoxic respiratory failure (HCC) Patient is not on home O2, presented with O2 sats to 79% On 10 L high flow nasal cannula in the ER Wean off O2 supplementation as tolerated Continues to smoke, cessation encouraged CXR with moderate peribronchial thickening, likely viral in nature Given Ceftriaxone  and IV azithromycin  x 1 in ER; will complete 5 days of azithromycin  but change to PO Flu negative, stop Tamiflu  Received IV Decadron -> Solu-Medrol  to complete 5 total days DuoNebs every 6 hours and Albuterol  every 2 hours as needed for shortness of breath and wheezing As needed antitussives Incentive spirometer Early mobilization Will check RVP Resume Anoro (has been out of this medication and is requesting a refill at time of dc) Admit to progressive care unit Essential hypertension No antihypertensives seen on med rec Added Norvasc  5 mg daily BP currently 137/61 Type 2 diabetes mellitus with polyneuropathy (HCC) Last hemoglobin A1c 7.0 on 01/14/2024, good control Continue glargine Heart healthy/carb modified diet Resistant-scale insulin  coverage with mealtime dosing and at bedtime dosing Continue pregabalin  for neuropathy OSA on CPAP Continue CPAP nightly  Mixed hyperlipidemia Continue atorvastatin  Tobacco use disorder Encourage cessation This was discussed with the  patient and should be reviewed on an ongoing basis Patch ordered Adult attention deficit hyperactivity disorder Hold medications - she will not be performing tasks that require attention and focus as an inpatient  Continue Klonopin  as needed for anxiety Continue Abilify , Duloxetine  Morbid obesity with body mass index (BMI) of 40.0 to 44.9 in adult (HCC) Weight pending Weight loss should be encouraged on an ongoing basis Hold Ozempic  while inpatient Outpatient PCP/bariatric medicine f/u encouraged Significantly low or high BMI is associated with higher medical risk including morbidity and mortality        Consultants: None  Procedures: None  Antibiotics: Azithromycin  12/31-1/5 Ceftriaxone  x 1 dose     Subjective: Feeling some better, on HFNC O2.  Continues to smoke 1 ppd.  Physical Exam: Vitals:   04/23/24 0343 04/23/24 0424 04/23/24 0430 04/23/24 0806  BP:   (!) 146/100 126/61  Pulse: 96  87 91  Resp: 19  (!) 22 20  Temp:  (!) 97.5 F (36.4 C)  97.6 F (36.4 C)  TempSrc:  Oral  Oral  SpO2: 94%  92% 91%     Intake/Output Summary (Last 24 hours) at 04/23/2024 0946 Last data filed at 04/23/2024 0123 Gross per 24 hour  Intake 336.76 ml  Output --  Net 336.76 ml     Exam:  General:  Appears calm and comfortable and is in NAD, on HFNC O2 Eyes:  normal lids, iris ENT:  grossly normal hearing, lips & tongue, mmm Cardiovascular:  RRR. No LE edema.  Respiratory:   Expiratory wheezing with decreased air movement diffusely.  Normal respiratory effort. Abdomen:  soft, NT, ND, NABS Skin:  no rash or induration seen on limited exam  Musculoskeletal:  grossly normal tone BUE/BLE, good ROM, no bony abnormality Psychiatric: blunted mood and affect, speech fluent and appropriate, AOx3 Neurologic:  CN 2-12 grossly intact, moves all extremities in coordinated fashion  Data Reviewed: I have reviewed the patient's lab results since admission.  Pertinent labs for today  include:  Lactate 2.9 WBC 12.5     Family Communication: None present  Disposition: Status is: Inpatient Remains inpatient appropriate because: ongoing management  Planned Discharge Destination: Home   Total critical care time: 55 minutes Critical care time was exclusive of separately billable procedures and treating other patients. Critical care was necessary to treat or prevent imminent or life-threatening deterioration. Critical care was time spent personally by me on the following activities: development of treatment plan with patient and/or surrogate as well as nursing, discussions with consultants, evaluation of patient's response to treatment, examination of patient, obtaining history from patient or surrogate, ordering and performing treatments and interventions, ordering and review of laboratory studies, ordering and review of radiographic studies, pulse oximetry and re-evaluation of patient's condition.    Author: Delon Herald, MD 04/23/2024 9:42 AM  For on call review [email or web address].  "

## 2024-04-23 NOTE — Assessment & Plan Note (Addendum)
"  Continue CPAP nightly         "

## 2024-04-23 NOTE — Inpatient Diabetes Management (Signed)
 Inpatient Diabetes Program Recommendations  AACE/ADA: New Consensus Statement on Inpatient Glycemic Control (2015)  Target Ranges:  Prepandial:   less than 140 mg/dL      Peak postprandial:   less than 180 mg/dL (1-2 hours)      Critically ill patients:  140 - 180 mg/dL   Lab Results  Component Value Date   GLUCAP 295 (H) 04/23/2024   HGBA1C 7.0 (H) 01/14/2024    Review of Glycemic Control  Latest Reference Range & Units 04/23/24 08:42  Glucose-Capillary 70 - 99 mg/dL 704 (H)  (H): Data is abnormally high  Diabetes history: DM2 Outpatient Diabetes medications:  Lantus  20 units every day Ozempic  2 mg weeklly Current orders for Inpatient glycemic control:  Novolog  0-9 units TID and 0-5 units at bedtime Solumedrol 40 mg QD  Inpatient Diabetes Program Recommendations:    Please consider:  Semglee  20 units every day (home dose)  Thank you, Wyvonna Pinal, MSN, CDCES Diabetes Coordinator Inpatient Diabetes Program [phone] (team pager from 8a-5p)

## 2024-04-23 NOTE — ED Notes (Signed)
 Ambulated patient to restroom.

## 2024-04-23 NOTE — Assessment & Plan Note (Addendum)
 Last hemoglobin A1c 7.0 on 01/14/2024, good control Continue glargine Heart healthy/carb modified diet Resistant-scale insulin  coverage with mealtime dosing and at bedtime dosing Continue pregabalin  for neuropathy

## 2024-04-24 DIAGNOSIS — B348 Other viral infections of unspecified site: Secondary | ICD-10-CM | POA: Diagnosis present

## 2024-04-24 LAB — BASIC METABOLIC PANEL WITH GFR
Anion gap: 10 (ref 5–15)
BUN: 12 mg/dL (ref 6–20)
CO2: 27 mmol/L (ref 22–32)
Calcium: 9.2 mg/dL (ref 8.9–10.3)
Chloride: 104 mmol/L (ref 98–111)
Creatinine, Ser: 0.69 mg/dL (ref 0.44–1.00)
GFR, Estimated: >60 mL/min
Glucose, Bld: 172 mg/dL — ABNORMAL HIGH (ref 70–99)
Potassium: 4.3 mmol/L (ref 3.5–5.1)
Sodium: 141 mmol/L (ref 135–145)

## 2024-04-24 LAB — RESPIRATORY PANEL BY PCR

## 2024-04-24 LAB — CBG MONITORING, ED
Glucose-Capillary: 181 mg/dL — ABNORMAL HIGH (ref 70–99)
Glucose-Capillary: 131 mg/dL — ABNORMAL HIGH (ref 70–99)

## 2024-04-24 LAB — CBC
HCT: 40.7 % (ref 36.0–46.0)
Hemoglobin: 12.7 g/dL (ref 12.0–15.0)
MCH: 26.3 pg (ref 26.0–34.0)
MCHC: 31.2 g/dL (ref 30.0–36.0)
MCV: 84.3 fL (ref 80.0–100.0)
Platelets: 200 K/uL (ref 150–400)
RBC: 4.83 MIL/uL (ref 3.87–5.11)
RDW: 16 % — ABNORMAL HIGH (ref 11.5–15.5)
WBC: 18 K/uL — ABNORMAL HIGH (ref 4.0–10.5)
nRBC: 0 % (ref 0.0–0.2)

## 2024-04-24 LAB — GLUCOSE, CAPILLARY
Glucose-Capillary: 285 mg/dL — ABNORMAL HIGH (ref 70–99)
Glucose-Capillary: 159 mg/dL — ABNORMAL HIGH (ref 70–99)

## 2024-04-24 LAB — MAGNESIUM: Magnesium: 2.3 mg/dL (ref 1.7–2.4)

## 2024-04-24 MED ORDER — INFLUENZA VIRUS VACC SPLIT PF (FLUZONE) 0.5 ML IM SUSY
0.5000 mL | PREFILLED_SYRINGE | INTRAMUSCULAR | Status: AC
  Administered 2024-04-28: 0.5 mL via INTRAMUSCULAR
  Filled 2024-04-24: qty 0.5

## 2024-04-24 MED ORDER — OXYCODONE HCL 5 MG PO TABS
5.0000 mg | ORAL_TABLET | Freq: Four times a day (QID) | ORAL | Status: AC | PRN
  Administered 2024-04-24 (×2): 5 mg via ORAL
  Filled 2024-04-24 (×2): qty 1

## 2024-04-24 MED ORDER — METHOCARBAMOL 500 MG PO TABS
500.0000 mg | ORAL_TABLET | Freq: Four times a day (QID) | ORAL | Status: DC | PRN
  Administered 2024-04-24 – 2024-04-25 (×2): 500 mg via ORAL
  Filled 2024-04-24 (×2): qty 1

## 2024-04-24 MED ORDER — OXYCODONE HCL 5 MG PO TABS
5.0000 mg | ORAL_TABLET | Freq: Four times a day (QID) | ORAL | Status: AC | PRN
  Administered 2024-04-24 – 2024-04-25 (×2): 5 mg via ORAL
  Filled 2024-04-24 (×2): qty 1

## 2024-04-24 NOTE — Assessment & Plan Note (Addendum)
 Last hemoglobin A1c 7.0 on 01/14/2024, good control Continue glargine Heart healthy/carb modified diet Resistant-scale insulin  coverage with mealtime dosing and at bedtime dosing Continue pregabalin  for neuropathy

## 2024-04-24 NOTE — Progress Notes (Signed)
 " Progress Note   Patient: [name] FMW:[id number] DOB: April 20, 1966 DOA: 04/22/2024     1 DOS: the patient was seen and examined on 04/24/2024   Brief hospital course: 58yo with h/o COPD not on home O2, OSA on CPAP, CAD, HLD, T2DM, and morbid obesity who presented on 12/30 with SOB. O2 in 80s on room air. COVID/flu/RSV neative. CXR with moderate peribronchial thickening. Started on Ceftriaxone  and Azithromycin , prednisone , Duonebs. She is currently on 10L HFNC O2.   Assessment and Plan:  Assessment & Plan Acute exacerbation of chronic obstructive pulmonary disease (COPD) (HCC) Acute on chronic hypoxic respiratory failure (HCC) Rhinovirus infection Patient is not on home O2, presented with O2 sats to 79%; she reports needing O2 with prior exacerbations, but not qualifying for home O2 On 10 L high flow nasal cannula in the ER Wean off O2 supplementation as tolerated Continues to smoke, cessation encouraged CXR with moderate peribronchial thickening, likely viral in nature RVP came back positive for rhinovirus, which is the likely source Given Ceftriaxone  and IV azithromycin  x 1 in ER; will complete 5 days of azithromycin  but changed to PO Flu negative, stopped Tamiflu  Received IV Decadron -> Solu-Medrol  to complete 5 total days DuoNebs every 6 hours and Albuterol  every 2 hours as needed for shortness of breath and wheezing As needed antitussives Incentive spirometer Early mobilization Resume Anoro (has been out of this medication and is requesting a refill at time of dc) Admitted to progressive care unit given ongoing high O2 needs Essential hypertension No antihypertensives seen on med rec Added Norvasc  5 mg daily BP currently 150/55 Type 2 diabetes mellitus with polyneuropathy (HCC) Last hemoglobin A1c 7.0 on 01/14/2024, good control Continue glargine Heart healthy/carb modified diet Resistant-scale insulin  coverage with mealtime dosing and at bedtime dosing Continue  pregabalin  for neuropathy OSA on CPAP Continue CPAP nightly  Mixed hyperlipidemia Continue atorvastatin  Tobacco use disorder Encourage cessation This was discussed with the patient and should be reviewed on an ongoing basis Patch ordered Adult attention deficit hyperactivity disorder Hold medication - she will not be performing tasks that require attention and focus as an inpatient  Continue Klonopin  as needed for anxiety Continue Abilify , Duloxetine  Morbid obesity with body mass index (BMI) of 40.0 to 44.9 in adult (HCC) Weight pending Weight loss should be encouraged on an ongoing basis Hold Ozempic  while inpatient Outpatient PCP/bariatric medicine f/u encouraged Significantly low or high BMI is associated with higher medical risk including morbidity and mortality      Consultants: PT OT   Procedures: None   Antibiotics: Azithromycin  12/31-1/5 Ceftriaxone  x 1 dose   Subjective: Still feeling SOB, requiring significant HFNC O2.  Midback pain that she attributes to the ER stretcher.  Physical Exam: Vitals:   04/24/24 1430 04/24/24 1445 04/24/24 1530 04/24/24 1545  BP:  (!) 150/55 (!) 101/45 (!) 146/56  Pulse: 83 100 83   Resp: 19 19 18    Temp:   97.7 F (36.5 C)   TempSrc:   Oral   SpO2: 95% 92%    Weight:   119.2 kg   Height:   5' 4 (1.626 m)     Exam:  General:  Appears calm and comfortable and is in NAD, on HFNC O2 Eyes:  normal lids, iris ENT:  grossly normal hearing, lips & tongue, mmm Cardiovascular:  RRR. No LE edema.  Respiratory:   Persistent expiratory wheezing with fair air movement.  Normal to mildly increased respiratory effort. Abdomen:  soft, NT, ND Skin:  no rash or induration seen on limited exam Musculoskeletal:  grossly normal tone BUE/BLE, good ROM, no bony abnormality Psychiatric:  grossly normal mood and affect, speech fluent and appropriate, AOx3 Neurologic:  CN 2-12 grossly intact, moves all extremities in coordinated  fashion  Data Reviewed: I have reviewed the patient's lab results since admission.  Pertinent labs for today include:  Glucose 172 WBC 18 (steroids)     Family Communication: None present  Disposition: Status is: Inpatient Remains inpatient appropriate because: ongoing management  Planned Discharge Destination: Home    Time spent: 50 minutes  Author: Delon Herald, MD 04/24/2024 4:00 PM  For on call review [email or web address].  "

## 2024-04-24 NOTE — Assessment & Plan Note (Addendum)
 Hold medication - she will not be performing tasks that require attention and focus as an inpatient  Continue Klonopin  as needed for anxiety Continue Abilify , Duloxetine 

## 2024-04-24 NOTE — Progress Notes (Signed)
" °   04/24/24 2228  BiPAP/CPAP/SIPAP  BiPAP/CPAP/SIPAP Pt Type Adult  BiPAP/CPAP/SIPAP DREAMSTATIOND  Mask Type Nasal mask  Dentures removed? Not applicable  Mask Size Small  FiO2 (%)  (10L bled in)  Patient Home Machine No  Patient Home Mask No  Patient Home Tubing No  Auto Titrate Yes (auto 5-20)  CPAP/SIPAP surface wiped down Yes  Device Plugged into RED Power Outlet Yes  BiPAP/CPAP /SiPAP Vitals  Pulse Rate 81  SpO2 98 %  Bilateral Breath Sounds Expiratory wheezes    "

## 2024-04-24 NOTE — Assessment & Plan Note (Addendum)
 Patient is not on home O2, presented with O2 sats to 79%; she reports needing O2 with prior exacerbations, but not qualifying for home O2 On 10 L high flow nasal cannula in the ER Wean off O2 supplementation as tolerated Continues to smoke, cessation encouraged CXR with moderate peribronchial thickening, likely viral in nature RVP came back positive for rhinovirus, which is the likely source Given Ceftriaxone  and IV azithromycin  x 1 in ER; will complete 5 days of azithromycin  but changed to PO Flu negative, stopped Tamiflu  Received IV Decadron -> Solu-Medrol  to complete 5 total days DuoNebs every 6 hours and Albuterol  every 2 hours as needed for shortness of breath and wheezing As needed antitussives Incentive spirometer Early mobilization Resume Anoro (has been out of this medication and is requesting a refill at time of dc) Admitted to progressive care unit given ongoing high O2 needs

## 2024-04-24 NOTE — Assessment & Plan Note (Addendum)
 Weight pending Weight loss should be encouraged on an ongoing basis Hold Ozempic  while inpatient Outpatient PCP/bariatric medicine f/u encouraged Significantly low or high BMI is associated with higher medical risk including morbidity and mortality

## 2024-04-24 NOTE — Assessment & Plan Note (Addendum)
-  Encourage cessation.   ?-This was discussed with the patient and should be reviewed on an ongoing basis.   ?-Patch ordered  ?

## 2024-04-24 NOTE — Assessment & Plan Note (Addendum)
 No antihypertensives seen on med rec Added Norvasc  5 mg daily BP currently 150/55

## 2024-04-24 NOTE — Assessment & Plan Note (Addendum)
"  Continue CPAP nightly         "

## 2024-04-24 NOTE — Assessment & Plan Note (Addendum)
"  Continue atorvastatin   "

## 2024-04-25 DIAGNOSIS — J441 Chronic obstructive pulmonary disease with (acute) exacerbation: Secondary | ICD-10-CM | POA: Diagnosis not present

## 2024-04-25 LAB — GLUCOSE, CAPILLARY
Glucose-Capillary: 159 mg/dL — ABNORMAL HIGH (ref 70–99)
Glucose-Capillary: 196 mg/dL — ABNORMAL HIGH (ref 70–99)
Glucose-Capillary: 213 mg/dL — ABNORMAL HIGH (ref 70–99)
Glucose-Capillary: 122 mg/dL — ABNORMAL HIGH (ref 70–99)

## 2024-04-25 MED ORDER — UMECLIDINIUM-VILANTEROL 62.5-25 MCG/ACT IN AEPB: 1.0000 | INHALATION_SPRAY | Freq: Every day | RESPIRATORY_TRACT | 11 refills | Status: DC

## 2024-04-25 MED ORDER — NICOTINE 21 MG/24HR TD PT24
21.0000 mg | MEDICATED_PATCH | Freq: Every day | TRANSDERMAL | Status: DC | PRN
  Administered 2024-04-25 – 2024-04-28 (×4): 21 mg via TRANSDERMAL
  Filled 2024-04-25 (×4): qty 1

## 2024-04-25 MED ORDER — NAPROXEN 250 MG PO TABS
375.0000 mg | ORAL_TABLET | Freq: Three times a day (TID) | ORAL | Status: DC
  Administered 2024-04-26 – 2024-04-28 (×6): 375 mg via ORAL
  Filled 2024-04-25 (×5): qty 2

## 2024-04-25 MED ORDER — OXYCODONE HCL 5 MG PO TABS
5.0000 mg | ORAL_TABLET | Freq: Four times a day (QID) | ORAL | Status: DC | PRN
  Administered 2024-04-25 – 2024-04-28 (×8): 5 mg via ORAL
  Filled 2024-04-25 (×8): qty 1

## 2024-04-25 MED ORDER — METHOCARBAMOL 500 MG PO TABS
500.0000 mg | ORAL_TABLET | Freq: Four times a day (QID) | ORAL | Status: DC | PRN
  Administered 2024-04-25 – 2024-04-28 (×5): 500 mg via ORAL
  Filled 2024-04-25 (×5): qty 1

## 2024-04-25 NOTE — Assessment & Plan Note (Addendum)
 No antihypertensives seen on med rec Added Norvasc  5 mg daily BP currently 150/55

## 2024-04-25 NOTE — Assessment & Plan Note (Addendum)
 Hold Adderall - she will not be performing tasks that require attention and focus as an inpatient  Continue Klonopin  as needed for anxiety Continue Abilify , Duloxetine 

## 2024-04-25 NOTE — Assessment & Plan Note (Addendum)
 Last hemoglobin A1c 7.0 on 01/14/2024, good control Continue glargine Heart healthy/carb modified diet Resistant-scale insulin  coverage with mealtime dosing and at bedtime dosing Continue pregabalin  for neuropathy

## 2024-04-25 NOTE — Assessment & Plan Note (Addendum)
-  Encourage cessation.   ?-This was discussed with the patient and should be reviewed on an ongoing basis.   ?-Patch ordered  ?

## 2024-04-25 NOTE — Assessment & Plan Note (Addendum)
 Patient is not on home O2, presented with O2 sats to 79%; she reports needing O2 with prior exacerbations, but not qualifying for home O2 On 10 L high flow nasal cannula in the ER Wean off O2 supplementation as tolerated - currently on 6L [place] O2 Continues to smoke, cessation encouraged CXR with moderate peribronchial thickening, likely viral in nature RVP came back positive for rhinovirus, which is the likely source Given Ceftriaxone  and IV azithromycin  x 1 in ER; will complete 5 days of azithromycin  but changed to PO Flu negative, stopped Tamiflu  Received IV Decadron -> Solu-Medrol  to complete 5 total days DuoNebs every 6 hours and Albuterol  every 2 hours as needed for shortness of breath and wheezing As needed antitussives Incentive spirometer Early mobilization Resume Anoro (has been out of this medication and is requesting a refill at time of dc) Admitted to progressive care unit given ongoing high O2 needs

## 2024-04-25 NOTE — Progress Notes (Signed)
 " Progress Note   Patient: [name] FMW:[id number] DOB: Dec 26, 1965 DOA: 04/22/2024     2 DOS: the patient was seen and examined on 04/25/2024   Brief hospital course: 58yo with h/o COPD not on home O2, OSA on CPAP, CAD, HLD, T2DM, and morbid obesity who presented on 12/30 with SOB. O2 in 80s on room air. COVID/flu/RSV neative. CXR with moderate peribronchial thickening. Started on Ceftriaxone  and Azithromycin , prednisone , Duonebs. She is currently on 10L HFNC O2. RVP + for rhinovirus, changed to PO azithromycin  monotherapy to complete 5 days.    Assessment & Plan Acute exacerbation of chronic obstructive pulmonary disease (COPD) (HCC) Acute on chronic hypoxic respiratory failure (HCC) Rhinovirus infection Patient is not on home O2, presented with O2 sats to 79%; she reports needing O2 with prior exacerbations, but not qualifying for home O2 On 10 L high flow nasal cannula in the ER Wean off O2 supplementation as tolerated - currently on 6L [place] O2 Continues to smoke, cessation encouraged CXR with moderate peribronchial thickening, likely viral in nature RVP came back positive for rhinovirus, which is the likely source Given Ceftriaxone  and IV azithromycin  x 1 in ER; will complete 5 days of azithromycin  but changed to PO Flu negative, stopped Tamiflu  Received IV Decadron -> Solu-Medrol  to complete 5 total days DuoNebs every 6 hours and Albuterol  every 2 hours as needed for shortness of breath and wheezing As needed antitussives Incentive spirometer Early mobilization Resume Anoro (has been out of this medication and is requesting a refill at time of dc) Admitted to progressive care unit given ongoing high O2 needs Essential hypertension No antihypertensives seen on med rec Added Norvasc  5 mg daily BP currently 150/55 Type 2 diabetes mellitus with polyneuropathy (HCC) Last hemoglobin A1c 7.0 on 01/14/2024, good control Continue glargine Heart healthy/carb modified  diet Resistant-scale insulin  coverage with mealtime dosing and at bedtime dosing Continue pregabalin  for neuropathy OSA on CPAP Continue CPAP nightly  Mixed hyperlipidemia Continue atorvastatin  Tobacco use disorder Encourage cessation This was discussed with the patient and should be reviewed on an ongoing basis Patch ordered Adult attention deficit hyperactivity disorder Hold Adderall - she will not be performing tasks that require attention and focus as an inpatient  Continue Klonopin  as needed for anxiety Continue Abilify , Duloxetine  Morbid obesity with body mass index (BMI) of 40.0 to 44.9 in adult (HCC) Body mass index is 45.11 kg/m.  Weight loss should be encouraged on an ongoing basis Hold Ozempic  while inpatient Outpatient PCP/bariatric medicine f/u encouraged Significantly low or high BMI is associated with higher medical risk including morbidity and mortality       Consultants: PT OT   Procedures: None   Antibiotics: Azithromycin  12/31-1/4 Ceftriaxone  x 1 dose  30 Day Unplanned Readmission Risk Score    Flowsheet Row ED to Hosp-Admission (Current) from 04/22/2024 in [place] 4TH FLOOR PROGRESSIVE CARE AND UROLOGY  30 Day Unplanned Readmission Risk Score (%) 19.81 Filed at 04/25/2024 0400    This score is the patient's risk of an unplanned readmission within 30 days of being discharged (0 -100%). The score is based on dignosis, age, lab data, medications, orders, and past utilization.   Low:  0-14.9   Medium: 15-21.9   High: 22-29.9   Extreme: 30 and above           Subjective: Slight improvement.  Continues to complain of mid-back pain and chest pain with coughing.   Objective: Vitals:   04/25/24 0328 04/25/24 0936  BP: 133/70  Pulse: 79   Resp: 16   Temp: 97.6 F (36.4 C)   SpO2: 91% 95%    Intake/Output Summary (Last 24 hours) at 04/25/2024 1259 Last data filed at 04/25/2024 1100 Gross per 24 hour  Intake 240 ml  Output --  Net 240 ml    Filed Weights   04/24/24 1530  Weight: 119.2 kg    Exam:  General:  Appears calm and comfortable and is in NAD Eyes:  normal lids, iris ENT:  grossly normal hearing, lips & tongue, mmm; appropriate dentition Cardiovascular:  RRR. No LE edema.  Respiratory:   Persistent expiratory wheezing with moderate air movement  Normal respiratory effort. Abdomen:  soft, NT, ND Skin:  no rash or induration seen on limited exam Musculoskeletal:  grossly normal tone BUE/BLE, good ROM, no bony abnormality Psychiatric:  grossly normal mood and affect, speech fluent and appropriate, AOx3 Neurologic:  CN 2-12 grossly intact, moves all extremities in coordinated fashion  Data Reviewed: I have reviewed the patient's lab results since admission.  Pertinent labs for today include:   None today     Family Communication: None present     Code Status: Full Code  Barriers to discharge:  persistent hypoxia  Disposition: Status is: Inpatient Remains inpatient appropriate because: ongoing management     Time spent: 50 minutes  Unresulted Labs (From admission, onward)     Start     Ordered   Unscheduled  CBC with Differential/Platelet  Tomorrow morning,   R        04/25/24 1259   Unscheduled  Basic metabolic panel with GFR  Tomorrow morning,   R        04/25/24 1259             Author: Delon Herald, MD 04/25/2024 12:59 PM  For on call review [email or web address].            "

## 2024-04-25 NOTE — Evaluation (Signed)
 Occupational Therapy Evaluation Patient Details Name: [name] MRN: [id number] DOB: 09/28/1965 Today's Date: 04/25/2024   History of Present Illness   Pt is 59 yo female admitted on 04/22/24 with COPD exacerbation, resp failure, and rhinovirus.  Pt with hx including but not limited to COPD not on home O2, OSA on CPAP, CAD, HLD, DM2, diabetic polyneuropathy, ADHD, smoker, obese on Ozempic , GERD, gastric erosions     Clinical Impressions Pt admitted with above. Prior to admission, pt was independent without AD at community level and does not use home O2. Pt presents with decreased cardiopulmonary status, requiring 6L via HFNC throughout session with SpO2 dropping to 88% with exertion, returning to 93% or above with PLB and seated recovery break. Pt currently at mod independent level for mobility and UB + LB bathing/dressing from STS, cues for PLB. Pt anticipated to progress without need for OT follow up. Pt would benefit from skilled OT services to address noted impairments and functional limitations (see below for any additional details) in order to maximize safety and independence while minimizing falls risk and caregiver burden.     If plan is discharge home, recommend the following:   Assist for transportation;Help with stairs or ramp for entrance;A little help with walking and/or transfers;A little help with bathing/dressing/bathroom     Functional Status Assessment   Patient has had a recent decline in their functional status and demonstrates the ability to make significant improvements in function in a reasonable and predictable amount of time.     Equipment Recommendations   None recommended by OT      Precautions/Restrictions   Precautions Precautions: None Restrictions Weight Bearing Restrictions Per Provider Order: No     Mobility Bed Mobility Overal bed mobility: Modified Independent             General bed mobility comments: HOB elevated     Transfers Overall transfer level: Modified independent Equipment used: None Transfers: Sit to/from Stand, Bed to chair/wheelchair/BSC Sit to Stand: Supervision, Modified independent (Device/Increase time)     Step pivot transfers: Modified independent (Device/Increase time)     General transfer comment: no physical assist for STS or step pivot to recliner, assist only for line mgmt      Balance Overall balance assessment: Modified Independent Sitting-balance support: No upper extremity supported Sitting balance-Leahy Scale: Normal     Standing balance support: No upper extremity supported Standing balance-Leahy Scale: Good                             ADL either performed or assessed with clinical judgement   ADL Overall ADL's : Modified independent                                       General ADL Comments: Pt close to baseline for ADL performance; limited by cardiopulmonary status with increased O2 needs. Pt able to complete UB/LB bathing + dressing including socks, underwear and shorts from STS with no AD. Good dynamic balance reaching well outside BOS towards sink from recliner. Cues for PLB due to dyspnea/audible   wheezing, no direct physical assist.     Vision Baseline Vision/History: 0 No visual deficits Ability to See in Adequate Light: 0 Adequate              Pertinent Vitals/Pain Pain Assessment Pain Assessment: No/denies pain  Extremity/Trunk Assessment Upper Extremity Assessment Upper Extremity Assessment: Overall WFL for tasks assessed   Lower Extremity Assessment Lower Extremity Assessment: Overall WFL for tasks assessed RLE Deficits / Details: ROM WFL; MMT 5/5 LLE Deficits / Details: ROM WFL; MMT 5/5   Cervical / Trunk Assessment Cervical / Trunk Assessment: Normal   Communication Communication Communication: No apparent difficulties   Cognition Arousal: Alert Behavior During Therapy: WFL for tasks  assessed/performed Cognition: No apparent impairments                               Following commands: Intact       Cueing  General Comments   Cueing Techniques: Verbal cues  SpO2 dropping to 88% on 6L via [place] with ADL performance. Returns quickly to 93%> with PLB and seated rest.           Home Living Family/patient expects to be discharged to:: Private residence Living Arrangements: Children;Other relatives Available Help at Discharge: Family;Available 24 hours/day Type of Home: Mobile home Home Access: Stairs to enter Entrance Stairs-Number of Steps: 6 Entrance Stairs-Rails: Right Home Layout: One level     Bathroom Shower/Tub: Chief Strategy Officer: Standard     Home Equipment: Agricultural Consultant (2 wheels);BSC/3in1          Prior Functioning/Environment Prior Level of Function : Independent/Modified Independent             Mobility Comments: community level ambulator without AD ADLs Comments: independent    OT Problem List: Cardiopulmonary status limiting activity;Obesity;Decreased activity tolerance   OT Treatment/Interventions: Patient/family education;Energy conservation;DME and/or AE instruction      OT Goals(Current goals can be found in the care plan section)   Acute Rehab OT Goals OT Goal Formulation: With patient Time For Goal Achievement: 05/09/24 Potential to Achieve Goals: Good   OT Frequency:  Min 1X/week       AM-PAC OT 6 Clicks Daily Activity     Outcome Measure Help from another person eating meals?: None Help from another person taking care of personal grooming?: None Help from another person toileting, which includes using toliet, bedpan, or urinal?: A Little Help from another person bathing (including washing, rinsing, drying)?: A Little Help from another person to put on and taking off regular upper body clothing?: None Help from another person to put on and taking off regular lower body  clothing?: A Little 6 Click Score: 21   End of Session Equipment Utilized During Treatment: Oxygen  Nurse Communication: Mobility status  Activity Tolerance: Patient tolerated treatment well Patient left: in chair;with call bell/phone within reach  OT Visit Diagnosis: Other abnormalities of gait and mobility (R26.89)                Time: 8454-8383 OT Time Calculation (min): 31 min Charges:  OT General Charges $OT Visit: 1 Visit OT Evaluation $OT Eval Low Complexity: 1 Low OT Treatments $Self Care/Home Management : 8-22 mins   Liliyana Thobe L. Ronelle Michie, OTR/L  04/25/2024, 5:33 PM

## 2024-04-25 NOTE — Evaluation (Signed)
 Physical Therapy Evaluation Patient Details Name: [name] MRN: [id number] DOB: Nov 26, 1965 Today's Date: 04/25/2024  History of Present Illness  Pt is 59 yo female admitted on 04/22/24 with COPD exacerbation, resp failure, and rhinovirus.  Pt with hx including but not limited to COPD not on home O2, OSA on CPAP, CAD, HLD, DM2, diabetic polyneuropathy, ADHD, smoker, obese on Ozempic , GERD, gastric erosions  Clinical Impression  Pt admitted with above diagnosis. Pt independent at baseline.  She is ambulatory without AD and does not use home O2.  Today, pt moving at mod I to supervision level but limited by cardiopulmonary endurance and on 6 L HFNC (see below for O2 assessment).  Pt expected to progress well and no PT needs at d/c.  Pt currently with functional limitations due to the deficits listed below (see PT Problem List). Pt will benefit from acute skilled PT to increase their independence and safety with mobility to allow discharge.           If plan is discharge home, recommend the following: Assistance with cooking/housework   Can travel by private vehicle        Equipment Recommendations None recommended by PT  Recommendations for Other Services       Functional Status Assessment Patient has had a recent decline in their functional status and demonstrates the ability to make significant improvements in function in a reasonable and predictable amount of time.     Precautions / Restrictions Precautions Precautions: None      Mobility  Bed Mobility Overal bed mobility: Modified Independent             General bed mobility comments: HOB elevated    Transfers Overall transfer level: Needs assistance Equipment used: None Transfers: Sit to/from Stand Sit to Stand: Supervision                Ambulation/Gait Ambulation/Gait assistance: Supervision Gait Distance (Feet): 60 Feet (60'x2) Assistive device: None Gait Pattern/deviations: Step-through  pattern Gait velocity: decreased but functional     General Gait Details: Pt pushing own O2 tank; 60'x2 limited by DOE requiring seated rest break.  Pt on 6 L HFNC with sats 95% rest dropped to 87% activity but recovered quickly; educated on pursed lip breathing  Stairs            Wheelchair Mobility     Tilt Bed    Modified Rankin (Stroke Patients Only)       Balance Overall balance assessment: Needs assistance Sitting-balance support: No upper extremity supported Sitting balance-Leahy Scale: Normal     Standing balance support: No upper extremity supported Standing balance-Leahy Scale: Good                               Pertinent Vitals/Pain Pain Assessment Pain Assessment: No/denies pain    Home Living Family/patient expects to be discharged to:: Private residence Living Arrangements: Children;Other relatives Available Help at Discharge: Family;Available 24 hours/day Type of Home: Mobile home Home Access: Stairs to enter Entrance Stairs-Rails: Right Entrance Stairs-Number of Steps: 6   Home Layout: One level Home Equipment: Agricultural [employer]ant (2 wheels);BSC/3in1      Prior Function Prior Level of Function : Independent/Modified Independent             Mobility Comments: Could ambulate in community without AD       Extremity/Trunk Assessment   Upper Extremity Assessment Upper Extremity Assessment: Overall WFL for tasks  assessed    Lower Extremity Assessment Lower Extremity Assessment: LLE deficits/detail;RLE deficits/detail RLE Deficits / Details: ROM WFL; MMT 5/5 LLE Deficits / Details: ROM WFL; MMT 5/5    Cervical / Trunk Assessment Cervical / Trunk Assessment: Normal  Communication        Cognition Arousal: Alert Behavior During Therapy: WFL for tasks assessed/performed   PT - Cognitive impairments: No apparent impairments                                 Cueing       General Comments      Exercises      Assessment/Plan    PT Assessment Patient needs continued PT services  PT Problem List Cardiopulmonary status limiting activity;Decreased activity tolerance       PT Treatment Interventions DME instruction;Gait training;Stair training;Functional mobility training;Therapeutic activities;Patient/family education    PT Goals (Current goals can be found in the Care Plan section)  Acute Rehab PT Goals Patient Stated Goal: return home PT Goal Formulation: With patient Time For Goal Achievement: 05/09/24 Potential to Achieve Goals: Good    Frequency Min 1X/week     Co-evaluation               AM-PAC PT 6 Clicks Mobility  Outcome Measure Help needed turning from your back to your side while in a flat bed without using bedrails?: None Help needed moving from lying on your back to sitting on the side of a flat bed without using bedrails?: None Help needed moving to and from a bed to a chair (including a wheelchair)?: A Little Help needed standing up from a chair using your arms (e.g., wheelchair or bedside chair)?: A Little Help needed to walk in hospital room?: A Little Help needed climbing 3-5 steps with a railing? : A Little 6 Click Score: 20    End of Session Equipment Utilized During Treatment: Oxygen  Activity Tolerance: Patient tolerated treatment well Patient left: in bed;with call bell/phone within reach;with bed alarm set Nurse Communication: Mobility status PT Visit Diagnosis: Other abnormalities of gait and mobility (R26.89)    Time: 1350-1407 PT Time Calculation (min) (ACUTE ONLY): 17 min   Charges:   PT Evaluation $PT Eval Low Complexity: 1 Low   PT General Charges $$ ACUTE PT VISIT: 1 Visit         [name], PT Acute Rehab Services [facility] [phone]   [name] [place] 04/25/2024, 3:39 PM

## 2024-04-25 NOTE — Assessment & Plan Note (Addendum)
 Body mass index is 45.11 kg/m.  Weight loss should be encouraged on an ongoing basis Hold Ozempic  while inpatient Outpatient PCP/bariatric medicine f/u encouraged Significantly low or high BMI is associated with higher medical risk including morbidity and mortality

## 2024-04-25 NOTE — Hospital Course (Signed)
 58yo with h/o COPD not on home O2, OSA on CPAP, CAD, HLD, T2DM, and morbid obesity who presented on 12/30 with SOB. O2 in 80s on room air. COVID/flu/RSV neative. CXR with moderate peribronchial thickening. Started on Ceftriaxone  and Azithromycin , prednisone , Duonebs. She is currently on 10L HFNC O2. RVP + for rhinovirus, changed to PO azithromycin  monotherapy to complete 5 days.

## 2024-04-25 NOTE — Assessment & Plan Note (Addendum)
"  Continue atorvastatin   "

## 2024-04-25 NOTE — Assessment & Plan Note (Addendum)
"  Continue CPAP nightly         "

## 2024-04-25 NOTE — Telephone Encounter (Signed)
 Requesting: umeclidinium-vilanterol (ANORO ELLIPTA ) 62.5-25 MCG/ACT AEPB  Last Visit: 01/14/2024 Next Visit: 05/20/2024 Last Refill: 06/29/2022  Please Advise

## 2024-04-26 DIAGNOSIS — J441 Chronic obstructive pulmonary disease with (acute) exacerbation: Secondary | ICD-10-CM | POA: Diagnosis not present

## 2024-04-26 LAB — CBC WITH DIFFERENTIAL/PLATELET
Abs Immature Granulocytes: 0.04 K/uL (ref 0.00–0.07)
Basophils Absolute: 0 K/uL (ref 0.0–0.1)
Basophils Relative: 0 %
Eosinophils Absolute: 0 K/uL (ref 0.0–0.5)
Eosinophils Relative: 0 %
HCT: 39.4 % (ref 36.0–46.0)
Hemoglobin: 12 g/dL (ref 12.0–15.0)
Immature Granulocytes: 0 %
Lymphocytes Relative: 30 %
Lymphs Abs: 4.1 K/uL — ABNORMAL HIGH (ref 0.7–4.0)
MCH: 25.5 pg — ABNORMAL LOW (ref 26.0–34.0)
MCHC: 30.5 g/dL (ref 30.0–36.0)
MCV: 83.8 fL (ref 80.0–100.0)
Monocytes Absolute: 1.1 K/uL — ABNORMAL HIGH (ref 0.1–1.0)
Monocytes Relative: 8 %
Neutro Abs: 8.6 K/uL — ABNORMAL HIGH (ref 1.7–7.7)
Neutrophils Relative %: 62 %
Platelets: 230 K/uL (ref 150–400)
RBC: 4.7 MIL/uL (ref 3.87–5.11)
RDW: 15.9 % — ABNORMAL HIGH (ref 11.5–15.5)
WBC: 13.8 K/uL — ABNORMAL HIGH (ref 4.0–10.5)
nRBC: 0 % (ref 0.0–0.2)

## 2024-04-26 LAB — GLUCOSE, CAPILLARY
Glucose-Capillary: 100 mg/dL — ABNORMAL HIGH (ref 70–99)
Glucose-Capillary: 175 mg/dL — ABNORMAL HIGH (ref 70–99)
Glucose-Capillary: 333 mg/dL — ABNORMAL HIGH (ref 70–99)
Glucose-Capillary: 153 mg/dL — ABNORMAL HIGH (ref 70–99)

## 2024-04-26 LAB — BASIC METABOLIC PANEL WITH GFR
Anion gap: 8 (ref 5–15)
BUN: 17 mg/dL (ref 6–20)
CO2: 33 mmol/L — ABNORMAL HIGH (ref 22–32)
Calcium: 9.1 mg/dL (ref 8.9–10.3)
Chloride: 102 mmol/L (ref 98–111)
Creatinine, Ser: 0.85 mg/dL (ref 0.44–1.00)
GFR, Estimated: >60 mL/min
Glucose, Bld: 123 mg/dL — ABNORMAL HIGH (ref 70–99)
Potassium: 4.4 mmol/L (ref 3.5–5.1)
Sodium: 142 mmol/L (ref 135–145)

## 2024-04-26 MED ORDER — AMLODIPINE BESYLATE 10 MG PO TABS
10.0000 mg | ORAL_TABLET | Freq: Every day | ORAL | Status: DC
  Administered 2024-04-27 – 2024-04-28 (×2): 10 mg via ORAL
  Filled 2024-04-26 (×2): qty 1

## 2024-04-26 MED ORDER — IPRATROPIUM-ALBUTEROL 0.5-2.5 (3) MG/3ML IN SOLN
3.0000 mL | Freq: Three times a day (TID) | RESPIRATORY_TRACT | Status: DC
  Administered 2024-04-27 – 2024-04-28 (×4): 3 mL via RESPIRATORY_TRACT
  Filled 2024-04-26 (×4): qty 3

## 2024-04-26 NOTE — Progress Notes (Signed)
" °   04/26/24 2214  BiPAP/CPAP/SIPAP  BiPAP/CPAP/SIPAP Pt Type Adult  BiPAP/CPAP/SIPAP DREAMSTATIOND  Mask Type Nasal mask  Dentures removed? Not applicable  Flow Rate 6 lpm  Patient Home Machine No  Patient Home Mask No  Patient Home Tubing No  Auto Titrate Yes  Minimum cmH2O 5 cmH2O  Maximum cmH2O 20 cmH2O  CPAP/SIPAP surface wiped down Yes  Device Plugged into RED Power Outlet Yes  BiPAP/CPAP /SiPAP Vitals  Pulse Rate 96  Resp 20  SpO2 96 %  Bilateral Breath Sounds Diminished;Expiratory wheezes  MEWS Score/Color  MEWS Score 0  MEWS Score Color Green    "

## 2024-04-26 NOTE — Assessment & Plan Note (Signed)
 Body mass index is 44.99 kg/m.  Weight loss should be encouraged on an ongoing basis Hold Ozempic  while inpatient Outpatient PCP/bariatric medicine f/u encouraged Significantly low or high BMI is associated with higher medical risk including morbidity and mortality

## 2024-04-26 NOTE — Assessment & Plan Note (Signed)
"  Continue atorvastatin   "

## 2024-04-26 NOTE — Assessment & Plan Note (Addendum)
 Patient is not on home O2, presented with O2 sats to 79%; she reports needing O2 with prior exacerbations, but not qualifying for home O2 On 10 L high flow nasal cannula in the ER Wean off O2 supplementation as tolerated - currently on 6L [place] O2 Continues to smoke, cessation encouraged CXR with moderate peribronchial thickening, likely viral in nature RVP came back positive for rhinovirus, which is the likely source Given Ceftriaxone  and IV azithromycin  x 1 in ER; will complete 5 days of azithromycin  but changed to PO Flu negative, stopped Tamiflu  Received IV Decadron  -> Solu-Medrol  to complete 5 total days DuoNebs every 6 hours and Albuterol  every 2 hours as needed for shortness of breath and wheezing As needed antitussives Incentive spirometer Early mobilization Resume Anoro (has been out of this medication and is requesting a refill at time of dc) Admitted to progressive care unit given ongoing high O2 needs, will transition to telemetry today

## 2024-04-26 NOTE — Assessment & Plan Note (Signed)
-  Encourage cessation.   ?-This was discussed with the patient and should be reviewed on an ongoing basis.   ?-Patch ordered  ?

## 2024-04-26 NOTE — Assessment & Plan Note (Signed)
 Hold Adderall - she will not be performing tasks that require attention and focus as an inpatient  Continue Klonopin  as needed for anxiety Continue Abilify , Duloxetine 

## 2024-04-26 NOTE — Assessment & Plan Note (Signed)
"  Continue CPAP nightly         "

## 2024-04-26 NOTE — Progress Notes (Signed)
 " Progress Note   Patient: [name] FMW:[id number] DOB: November 14, 1965 DOA: 04/22/2024     3 DOS: the patient was seen and examined on 04/26/2024   Brief hospital course: 58yo with h/o COPD not on home O2, OSA on CPAP, CAD, HLD, T2DM, and morbid obesity who presented on 12/30 with SOB. O2 in 80s on room air. COVID/flu/RSV neative. CXR with moderate peribronchial thickening. Started on Ceftriaxone  and Azithromycin , prednisone , Duonebs. She is currently on 10L HFNC O2. RVP + for rhinovirus, changed to PO azithromycin  monotherapy to complete 5 days.    Assessment & Plan Acute exacerbation of chronic obstructive pulmonary disease (COPD) (HCC) Acute on chronic hypoxic respiratory failure (HCC) Rhinovirus infection Patient is not on home O2, presented with O2 sats to 79%; she reports needing O2 with prior exacerbations, but not qualifying for home O2 On 10 L high flow nasal cannula in the ER Wean off O2 supplementation as tolerated - currently on 6L [place] O2 Continues to smoke, cessation encouraged CXR with moderate peribronchial thickening, likely viral in nature RVP came back positive for rhinovirus, which is the likely source Given Ceftriaxone  and IV azithromycin  x 1 in ER; will complete 5 days of azithromycin  but changed to PO Flu negative, stopped Tamiflu  Received IV Decadron  -> Solu-Medrol  to complete 5 total days DuoNebs every 6 hours and Albuterol  every 2 hours as needed for shortness of breath and wheezing As needed antitussives Incentive spirometer Early mobilization Resume Anoro (has been out of this medication and is requesting a refill at time of dc) Admitted to progressive care unit given ongoing high O2 needs, will transition to telemetry today Essential hypertension No antihypertensives seen on med rec Added Norvasc  5 mg daily, increase to 10 mg daily BP currently 157/70 Type 2 diabetes mellitus with polyneuropathy (HCC) Last hemoglobin A1c 7.0 on 01/14/2024, good  control Continue glargine Heart healthy/carb modified diet Resistant-scale insulin  coverage with mealtime dosing and at bedtime dosing Continue pregabalin  for neuropathy OSA on CPAP Continue CPAP nightly  Mixed hyperlipidemia Continue atorvastatin  Tobacco use disorder Encourage cessation This was discussed with the patient and should be reviewed on an ongoing basis Patch ordered Adult attention deficit hyperactivity disorder Hold Adderall - she will not be performing tasks that require attention and focus as an inpatient  Continue Klonopin  as needed for anxiety Continue Abilify , Duloxetine  Morbid obesity with body mass index (BMI) of 40.0 to 44.9 in adult (HCC) Body mass index is 44.99 kg/m.  [name] loss should be encouraged on an ongoing basis Hold Ozempic  while inpatient Outpatient PCP/bariatric medicine f/u encouraged Significantly low or high BMI is associated with higher medical risk including morbidity and mortality        Consultants: PT OT   Procedures: None   Antibiotics: Azithromycin  12/31-1/4 Ceftriaxone  x 1 dose  30 Day Unplanned Readmission Risk Score    Flowsheet Row ED to Hosp-Admission (Current) from 04/22/2024 in [place] 4TH FLOOR PROGRESSIVE CARE AND UROLOGY  30 Day Unplanned Readmission Risk Score (%) 15.49 Filed at 04/26/2024 0400    This score is the patient's risk of an unplanned readmission within 30 days of being discharged (0 -100%). The score is based on dignosis, age, lab data, medications, orders, and past utilization.   Low:  0-14.9   Medium: 15-21.9   High: 22-29.9   Extreme: 30 and above           Subjective: Starting to feel better.  Still having back pain.  Did well with therapy.  Objective: Vitals:   04/26/24 0124 04/26/24 0548  BP:  (!) 145/69  Pulse:  81  Resp:  20  Temp:  98.3 F (36.8 C)  SpO2: 95% 100%    Intake/Output Summary (Last 24 hours) at 04/26/2024 0754 Last data filed at 04/25/2024 2300 Gross per 24  hour  Intake 340 ml  Output --  Net 340 ml   Filed Weights   04/24/24 1530 04/26/24 0500  [name]: 119.2 kg 118.9 kg    Exam:  General:  Appears calm and comfortable and is in NAD, on 6L [place] O2 Eyes:  normal lids, iris ENT:  grossly normal hearing, lips & tongue, mmm Cardiovascular:  RRR. No LE edema.  Respiratory:   Diffuse expiratory wheezing with improved air movement.  Normal respiratory effort. Abdomen:  soft, NT, ND Skin:  no rash or induration seen on limited exam Musculoskeletal:  grossly normal tone BUE/BLE, good ROM, no bony abnormality Psychiatric:  grossly normal mood and affect, speech fluent and appropriate, AOx3 Neurologic:  CN 2-12 grossly intact, moves all extremities in coordinated fashion  Data Reviewed: I have reviewed the patient's lab results since admission.  Pertinent labs for today include:   CO2 33 Glucose 123 WBC 13.8, down from 18     Family Communication: None present  Mobility: PT/OT Consulted and are recommending - No Pt Follow Up1/05/2024 1538    Code Status: Full Code   Disposition: Status is: Inpatient Remains inpatient appropriate because: ongoing management, likely to improve over the next few days and may be able to go home on home O2     Time spent: 50 minutes  Unresulted Labs (From admission, onward)    None        Author: Delon Herald, MD 04/26/2024 7:54 AM  For on call review [email or web address]            "

## 2024-04-26 NOTE — Assessment & Plan Note (Signed)
 Last hemoglobin A1c 7.0 on 01/14/2024, good control Continue glargine Heart healthy/carb modified diet Resistant-scale insulin  coverage with mealtime dosing and at bedtime dosing Continue pregabalin  for neuropathy

## 2024-04-26 NOTE — Assessment & Plan Note (Addendum)
 No antihypertensives seen on med rec Added Norvasc  5 mg daily, increase to 10 mg daily BP currently 157/70

## 2024-04-27 DIAGNOSIS — J441 Chronic obstructive pulmonary disease with (acute) exacerbation: Secondary | ICD-10-CM | POA: Diagnosis not present

## 2024-04-27 LAB — GLUCOSE, CAPILLARY
Glucose-Capillary: 97 mg/dL (ref 70–99)
Glucose-Capillary: 172 mg/dL — ABNORMAL HIGH (ref 70–99)
Glucose-Capillary: 284 mg/dL — ABNORMAL HIGH (ref 70–99)
Glucose-Capillary: 99 mg/dL (ref 70–99)

## 2024-04-27 NOTE — Progress Notes (Signed)
" °   04/27/24 0940  Oxygen  Therapy/Pulse Ox  O2 Device Nasal Cannula  SpO2 96 %  O2 Therapy Oxygen   O2 Flow Rate (L/min) (S)  4 L/min (Weaned to 3 L [place].)  FiO2 (%) 36 %    "

## 2024-04-27 NOTE — Assessment & Plan Note (Addendum)
 Patient is not on home O2, presented with O2 sats to 79%; she reports needing O2 with prior exacerbations, but not qualifying for home O2 On 10 L high flow nasal cannula in the ER Wean off O2 supplementation as tolerated - currently on 3L [place] O2 Continues to smoke, cessation encouraged CXR with moderate peribronchial thickening, likely viral in nature RVP came back positive for rhinovirus, which is the likely source Given Ceftriaxone  and IV azithromycin  x 1 in ER; completed 5 days of azithromycin  Flu negative, stopped Tamiflu  Received IV Decadron  -> Solu-Medrol  to complete 5 total days DuoNebs every 6 hours and Albuterol  every 2 hours as needed for shortness of breath and wheezing As needed antitussives Incentive spirometer Early mobilization Resume Anoro (has been out of this medication and is requesting a refill at time of dc) Admitted to progressive care unit given ongoing high O2 needs, will transition to med surg today Anticipate dc to home tomorrow if she has ongoing improvement - likely with home O2

## 2024-04-27 NOTE — Assessment & Plan Note (Addendum)
 Body mass index is 46.23 kg/m.  Weight loss should be encouraged on an ongoing basis Hold Ozempic  while inpatient Outpatient PCP/bariatric medicine f/u encouraged Significantly low or high BMI is associated with higher medical risk including morbidity and mortality

## 2024-04-27 NOTE — Assessment & Plan Note (Addendum)
"  Continue atorvastatin   "

## 2024-04-27 NOTE — Assessment & Plan Note (Addendum)
 Last hemoglobin A1c 7.0 on 01/14/2024, good control Continue glargine Heart healthy/carb modified diet Resistant-scale insulin  coverage with mealtime dosing and at bedtime dosing Continue pregabalin  for neuropathy

## 2024-04-27 NOTE — Assessment & Plan Note (Addendum)
"  Continue CPAP nightly         "

## 2024-04-27 NOTE — Assessment & Plan Note (Addendum)
-  Encourage cessation.   ?-This was discussed with the patient and should be reviewed on an ongoing basis.   ?-Patch ordered  ?

## 2024-04-27 NOTE — Progress Notes (Signed)
 " Progress Note   Patient: [name] FMW:[id number] DOB: 11-25-1965 DOA: 04/22/2024     4 DOS: the patient was seen and examined on 04/27/2024   Brief hospital course: 59yo with h/o COPD not on home O2, OSA on CPAP, CAD, HLD, T2DM, and morbid obesity who presented on 12/30 with SOB. O2 in 80s on room air. COVID/flu/RSV neative. CXR with moderate peribronchial thickening. Started on Ceftriaxone  and Azithromycin , prednisone , Duonebs. She is currently on 10L HFNC O2. RVP + for rhinovirus, changed to PO azithromycin  monotherapy to complete 5 days.   Assessment & Plan Acute exacerbation of chronic obstructive pulmonary disease (COPD) (HCC) Acute on chronic hypoxic respiratory failure (HCC) Rhinovirus infection Patient is not on home O2, presented with O2 sats to 79%; she reports needing O2 with prior exacerbations, but not qualifying for home O2 On 10 L high flow nasal cannula in the ER Wean off O2 supplementation as tolerated - currently on 3L [place] O2 Continues to smoke, cessation encouraged CXR with moderate peribronchial thickening, likely viral in nature RVP came back positive for rhinovirus, which is the likely source Given Ceftriaxone  and IV azithromycin  x 1 in ER; completed 5 days of azithromycin  Flu negative, stopped Tamiflu  Received IV Decadron  -> Solu-Medrol  to complete 5 total days DuoNebs every 6 hours and Albuterol  every 2 hours as needed for shortness of breath and wheezing As needed antitussives Incentive spirometer Early mobilization Resume Anoro (has been out of this medication and is requesting a refill at time of dc) Admitted to progressive care unit given ongoing high O2 needs, will transition to med surg today Anticipate dc to home tomorrow if she has ongoing improvement - likely with home O2 Essential hypertension No antihypertensives seen on med rec Added Norvasc  5 mg daily, increased to 10 mg daily BP currently 149/70 Type 2 diabetes mellitus with  polyneuropathy (HCC) Last hemoglobin A1c 7.0 on 01/14/2024, good control Continue glargine Heart healthy/carb modified diet Resistant-scale insulin  coverage with mealtime dosing and at bedtime dosing Continue pregabalin  for neuropathy OSA on CPAP Continue CPAP nightly  Mixed hyperlipidemia Continue atorvastatin  Tobacco use disorder Encourage cessation This was discussed with the patient and should be reviewed on an ongoing basis Patch ordered Adult attention deficit hyperactivity disorder Hold Adderall - she will not be performing tasks that require attention and focus as an inpatient  Continue Klonopin  as needed for anxiety Continue Abilify , Duloxetine  Morbid obesity with body mass index (BMI) of 40.0 to 44.9 in adult [facility]) Body mass index is 46.23 kg/m.  Weight loss should be encouraged on an ongoing basis Hold Ozempic  while inpatient Outpatient PCP/bariatric medicine f/u encouraged Significantly low or high BMI is associated with higher medical risk including morbidity and mortality       Consultants: PT OT   Procedures: None   Antibiotics: Azithromycin  12/31-1/4 Ceftriaxone  x 1 dose   30 Day Unplanned Readmission Risk Score    Flowsheet Row ED to Hosp-Admission (Current) from 04/22/2024 in [place] 4TH FLOOR PROGRESSIVE CARE AND UROLOGY  30 Day Unplanned Readmission Risk Score (%) 15.76 Filed at 04/27/2024 0801    This score is the patient's risk of an unplanned readmission within 30 days of being discharged (0 -100%). The score is based on dignosis, age, lab data, medications, orders, and past utilization.   Low:  0-14.9   Medium: 15-21.9   High: 22-29.9   Extreme: 30 and above           Subjective: Continues to feel better.  Weaned  down to 3L this AM.  Thinks she needs 1 more day of hospitalization.  Hoping to get off O2.   Objective: Vitals:   04/27/24 0940 04/27/24 1305  BP:  (!) 149/70  Pulse:  90  Resp:    Temp:  98.4 F (36.9 C)  SpO2:  96% 95%    Intake/Output Summary (Last 24 hours) at 04/27/2024 1341 Last data filed at 04/26/2024 2200 Gross per 24 hour  Intake 220 ml  Output --  Net 220 ml   Filed Weights   04/24/24 1530 04/26/24 0500 04/27/24 0500  Weight: 119.2 kg 118.9 kg 122.2 kg    Exam:  General:  Appears calm and comfortable and is in NAD Eyes:  normal lids, iris ENT:  grossly normal hearing, lips & tongue, mmm Cardiovascular:  RRR. No LE edema.  Respiratory:   CTA bilaterally with no wheezes/rales/rhonchi.  Normal respiratory effort. Abdomen:  soft, NT, ND Skin:  no rash or induration seen on limited exam Musculoskeletal:  grossly normal tone BUE/BLE, good ROM, no bony abnormality Psychiatric:  grossly normal mood and affect, speech fluent and appropriate, AOx3 Neurologic:  CN 2-12 grossly intact, moves all extremities in coordinated fashion  Data Reviewed: I have reviewed the patient's lab results since admission.  Pertinent labs for today include:   None today     Family Communication: None present  Mobility: PT/OT Consulted and are recommending - No Pt Follow Up1/05/2024 1538    Code Status: Full Code  Disposition: Status is: Inpatient Remains inpatient appropriate because: ongoing monitoring     Time spent: 50 minutes  Unresulted Labs (From admission, onward)     Start     Ordered   04/28/24 0500  CBC  Tomorrow morning,   R        04/27/24 0907   04/28/24 0500  Basic metabolic panel with GFR  Tomorrow morning,   R        04/27/24 9092             Author: Delon Herald, MD 04/27/2024 1:41 PM  For on call review [email or web address]            "

## 2024-04-27 NOTE — Assessment & Plan Note (Addendum)
 Hold Adderall - she will not be performing tasks that require attention and focus as an inpatient  Continue Klonopin  as needed for anxiety Continue Abilify , Duloxetine 

## 2024-04-27 NOTE — Plan of Care (Incomplete)
" °  Problem: Education: Goal: Ability to describe self-care measures that may prevent or decrease complications (Diabetes Survival Skills Education) will improve Outcome: Progressing Goal: Individualized Educational Video(s) Outcome: Progressing   Problem: Coping: Goal: Ability to adjust to condition or change in health will improve Outcome: Progressing   Problem: Fluid Volume: Goal: Ability to maintain a balanced intake and output will improve Outcome: Progressing   Problem: Health Behavior/Discharge Planning: Goal: Ability to manage health-related needs will improve Outcome: Progressing   Problem: Metabolic: Goal: Ability to maintain appropriate glucose levels will improve Outcome: Progressing   Problem: Nutritional: Goal: Maintenance of adequate nutrition will improve Outcome: Adequate for Discharge   Problem: Skin Integrity: Goal: Risk for impaired skin integrity will decrease Outcome: Adequate for Discharge   "

## 2024-04-27 NOTE — Assessment & Plan Note (Addendum)
 No antihypertensives seen on med rec Added Norvasc  5 mg daily, increased to 10 mg daily BP currently 149/70

## 2024-04-28 ENCOUNTER — Ambulatory Visit (HOSPITAL_COMMUNITY): Admission: RE | Admit: 2024-04-28 | Source: Home / Self Care | Admitting: Internal Medicine

## 2024-04-28 ENCOUNTER — Other Ambulatory Visit (HOSPITAL_COMMUNITY): Payer: Self-pay

## 2024-04-28 DIAGNOSIS — J441 Chronic obstructive pulmonary disease with (acute) exacerbation: Secondary | ICD-10-CM | POA: Diagnosis not present

## 2024-04-28 LAB — BASIC METABOLIC PANEL WITH GFR
Anion gap: 8 (ref 5–15)
BUN: 18 mg/dL (ref 6–20)
CO2: 33 mmol/L — ABNORMAL HIGH (ref 22–32)
Calcium: 9 mg/dL (ref 8.9–10.3)
Chloride: 100 mmol/L (ref 98–111)
Creatinine, Ser: 0.73 mg/dL (ref 0.44–1.00)
GFR, Estimated: >60 mL/min
Glucose, Bld: 148 mg/dL — ABNORMAL HIGH (ref 70–99)
Potassium: 3.8 mmol/L (ref 3.5–5.1)
Sodium: 140 mmol/L (ref 135–145)

## 2024-04-28 LAB — CBC
HCT: 37.9 % (ref 36.0–46.0)
Hemoglobin: 11.7 g/dL — ABNORMAL LOW (ref 12.0–15.0)
MCH: 25.6 pg — ABNORMAL LOW (ref 26.0–34.0)
MCHC: 30.9 g/dL (ref 30.0–36.0)
MCV: 82.9 fL (ref 80.0–100.0)
Platelets: 226 K/uL (ref 150–400)
RBC: 4.57 MIL/uL (ref 3.87–5.11)
RDW: 15.9 % — ABNORMAL HIGH (ref 11.5–15.5)
WBC: 16 K/uL — ABNORMAL HIGH (ref 4.0–10.5)
nRBC: 0 % (ref 0.0–0.2)

## 2024-04-28 LAB — CULTURE, BLOOD (ROUTINE X 2)
Culture: NO GROWTH
Special Requests: ADEQUATE

## 2024-04-28 LAB — GLUCOSE, CAPILLARY: Glucose-Capillary: 97 mg/dL (ref 70–99)

## 2024-04-28 MED ORDER — NICOTINE 21 MG/24HR TD PT24
21.0000 mg | MEDICATED_PATCH | Freq: Every day | TRANSDERMAL | 0 refills | Status: DC | PRN
  Filled 2024-04-28: qty 28, 28d supply, fill #0

## 2024-04-28 MED ORDER — UMECLIDINIUM-VILANTEROL 62.5-25 MCG/ACT IN AEPB
1.0000 | INHALATION_SPRAY | Freq: Every day | RESPIRATORY_TRACT | 0 refills | Status: AC
  Filled 2024-04-28: qty 60, 30d supply, fill #0

## 2024-04-28 MED ORDER — ROBAFEN DM 20-200 MG/20ML PO LIQD
5.0000 mL | ORAL | 0 refills | Status: AC | PRN
  Filled 2024-04-28: qty 118, 5d supply, fill #0

## 2024-04-28 MED ORDER — AMLODIPINE BESYLATE 10 MG PO TABS
5.0000 mg | ORAL_TABLET | Freq: Every day | ORAL | 0 refills | Status: DC
  Filled 2024-04-28: qty 15, 30d supply, fill #0

## 2024-04-28 NOTE — Assessment & Plan Note (Addendum)
"  Continue CPAP nightly         "

## 2024-04-28 NOTE — Progress Notes (Signed)
" °   04/28/24 0049  BiPAP/CPAP/SIPAP  $ Non-Invasive Home Ventilator  Subsequent  BiPAP/CPAP/SIPAP Pt Type Adult  BiPAP/CPAP/SIPAP DREAMSTATIOND (self placement)  Mask Type Nasal mask  Dentures removed? Not applicable  Mask Size Small  Flow Rate 4 lpm  Patient Home Machine No  Patient Home Mask No  Patient Home Tubing No  Auto Titrate Yes  Minimum cmH2O 5 cmH2O  Maximum cmH2O 20 cmH2O  Device Plugged into RED Power Outlet Yes    "

## 2024-04-28 NOTE — Discharge Summary (Signed)
 " Physician Discharge Summary   Patient: [name] MRN: [id number] DOB: February 02, 1966  Admit date:     04/22/2024  Discharge date: 04/28/2024  Discharge Physician: Delon Herald   PCP: Thedora Garnette HERO, MD   Recommendations at discharge:   You are being discharged home with home oxygen  STOP smoking!  Nicotine  patch prescribed Continue Anoro and Duonebs as well as needed Albuterol  nebulizer treatments Take Norvasc /amlodipine  5 mg daily for blood pressure control Follow up with Dr. Thedora in 1-2 weeks You are being referred for lung cancer screening and should be contacted to schedule an appointment   Discharge Diagnoses: Principal Problem:   Acute exacerbation of chronic obstructive pulmonary disease (COPD) (HCC) Active Problems:   Morbid obesity with body mass index (BMI) of 40.0 to 44.9 in adult [facility])   Essential hypertension   Type 2 diabetes mellitus with polyneuropathy (HCC)   OSA on CPAP   Adult attention deficit hyperactivity disorder   Mixed hyperlipidemia   Tobacco use disorder   Acute on chronic hypoxic respiratory failure (HCC)   Rhinovirus infection    Hospital Course: 58yo with h/o COPD not on home O2, OSA on CPAP, CAD, HLD, T2DM, and morbid obesity who presented on 12/30 with SOB. O2 in 80s on room air. COVID/flu/RSV neative. CXR with moderate peribronchial thickening. Started on Ceftriaxone  and Azithromycin , prednisone , Duonebs. She is currently on 10L [place] O2. RVP + for rhinovirus, changed to PO azithromycin  monotherapy to complete 5 days.  Assessment and Plan:  Assessment & Plan Acute exacerbation of chronic obstructive pulmonary disease (COPD) (HCC) Acute on chronic hypoxic respiratory failure (HCC) Rhinovirus infection Patient is not on home O2, presented with O2 sats to 79%; she reports needing O2 with prior exacerbations, but not qualifying for home O2 On 10 L high flow nasal cannula in the ER Wean off O2 supplementation as tolerated - currently  on 3L [place] O2 Continues to smoke, cessation encouraged CXR with moderate peribronchial thickening, likely viral in nature RVP came back positive for rhinovirus, which is the likely source Given Ceftriaxone  and IV azithromycin  x 1 in ER; completed 5 days of azithromycin  Flu negative, stopped Tamiflu  Received IV Decadron  -> Solu-Medrol , completed 5 total days DuoNebs every 6 hours and Albuterol  every 2 hours as needed for shortness of breath and wheezing As needed antitussives Incentive spirometer Early mobilization Resume Anoro (has been out of this medication and is requesting a refill at time of dc) She has continued to feel better and is ready to dc to home today with home O2 Essential hypertension No antihypertensives on presentation Added Norvasc  5 mg daily, increased to 10 mg daily but current BP is 106/59 so will decrease back to 5 mg daily for now Type 2 diabetes mellitus with polyneuropathy (HCC) Last hemoglobin A1c 7.0 on 01/14/2024, good control Continue glargine, resume Ozempic  Heart healthy/carb modified diet Continue pregabalin  for neuropathy OSA on CPAP Continue CPAP nightly  Mixed hyperlipidemia Continue atorvastatin  Tobacco use disorder Encourage cessation This was discussed with the patient and should be reviewed on an ongoing basis Patch ordered Adult attention deficit hyperactivity disorder Resume Adderall at time of dc Continue Klonopin  as needed for anxiety Continue Abilify , Duloxetine  Morbid obesity with body mass index (BMI) of 40.0 to 44.9 in adult (HCC) Body mass index is 46.31 kg/m.  Weight loss should be encouraged on an ongoing basis Resume Ozempic  at time of discharge Outpatient PCP/bariatric medicine f/u encouraged Significantly low or high BMI is associated with higher medical risk including  morbidity and mortality       Consultants: PT OT   Procedures: None   Antibiotics: Azithromycin  12/31-1/4 Ceftriaxone  x 1 dose    Pain control  - [place]  Controlled Substance Reporting System database was reviewed. and patient was instructed, not to drive, operate heavy machinery, perform activities at heights, swimming or participation in water  activities or provide baby-sitting services while on Pain, Sleep and Anxiety Medications; until their outpatient Physician has advised to do so again. Also recommended to not to take more than prescribed Pain, Sleep and Anxiety Medications.   Disposition: Home Diet recommendation:  Carb modified diet DISCHARGE MEDICATION: Allergies as of 04/28/2024       Reactions   Codeine  Itching, Nausea Only   Metformin  And Related Diarrhea, Nausea Only, Other (See Comments)   Stomach pain/nausea   Wellbutrin  [bupropion  Hcl] Hives   Acyclovir And Related Rash        Medication List     STOP taking these medications    ibuprofen  200 MG tablet Commonly known as: ADVIL    naproxen  375 MG tablet Commonly known as: NAPROSYN        TAKE these medications    Accu-Chek Guide test strip Generic drug: glucose blood USE UP TO FOUR TIMES DAILY AS DIRECTED   Accu-Chek Softclix Lancets lancets USE UP TO FOUR TIMES DAILY AS DIRECTED   albuterol  108 (90 Base) MCG/ACT inhaler Commonly known as: VENTOLIN  HFA Inhale 2 puffs into the lungs every 6 (six) hours as needed for wheezing or shortness of breath.   albuterol  (2.5 MG/3ML) 0.083% nebulizer solution Commonly known as: PROVENTIL  Take 3 mLs (2.5 mg total) by nebulization every 6 (six) hours as needed for wheezing or shortness of breath.   amLODipine  10 MG tablet Commonly known as: NORVASC  Take 0.5 tablets (5 mg total) by mouth daily. Start taking on: April 29, 2024   amphetamine -dextroamphetamine  30 MG tablet Commonly known as: Adderall Take 1 tablet by mouth 2 (two) times daily. What changed:  when to take this additional instructions   ARIPiprazole  5 MG tablet Commonly known as: ABILIFY  Take 1 tablet (5 mg total) by mouth  daily.   atorvastatin  40 MG tablet Commonly known as: LIPITOR TAKE 0.5 TABLETS (20 MG TOTAL) BY MOUTH 2 (TWO) TIMES DAILY. What changed:  how much to take when to take this   BD Pen Needle Nano 2nd Gen 32G X 4 MM Misc Generic drug: Insulin  Pen Needle USE DAILY WITH VICTOZA    bismuth subsalicylate 262 MG/15ML suspension Commonly known as: PEPTO BISMOL Take 30 mLs by mouth every 6 (six) hours as needed for indigestion.   blood glucose meter kit and supplies Dispense based on patient and insurance preference. Use up to four times daily as directed. (FOR ICD-10 E10.9, E11.9).   clonazePAM  0.5 MG tablet Commonly known as: KLONOPIN  Take 1 tablet (0.5 mg total) by mouth 2 (two) times daily as needed for anxiety.   DULoxetine  60 MG capsule Commonly known as: CYMBALTA  Take 1 capsule (60 mg total) by mouth in the morning.   fluticasone  50 MCG/ACT nasal spray Commonly known as: FLONASE  PLACE 1 SPRAY INTO BOTH NOSTRILS 2 (TWO) TIMES DAILY AS NEEDED FOR ALLERGIES OR RHINITIS (AFTER SINUS RINSE).   guaiFENesin -dextromethorphan  100-10 MG/5ML syrup Commonly known as: ROBITUSSIN DM Take 5 mLs by mouth every 4 (four) hours as needed for cough.   INSULIN  SYRINGE .5CC/31GX5/16 31G X 5/16 0.5 ML Misc Inject 1 Units into the muscle daily. use as directed   ipratropium-albuterol   0.5-2.5 (3) MG/3ML Soln Commonly known as: DUONEB TAKE 3 MLS BY NEBULIZATION 3 (THREE) TIMES DAILY. What changed:  when to take this reasons to take this   Lantus  SoloStar 100 UNIT/ML Solostar Pen Generic drug: insulin  glargine Inject 20 Units into the skin daily. What changed: when to take this   meloxicam  15 MG tablet Commonly known as: MOBIC  Take 1 tablet (15 mg total) by mouth daily.   nicotine  21 mg/24hr patch Commonly known as: NICODERM CQ  - dosed in mg/24 hours Place 1 patch (21 mg total) onto the skin daily as needed (tobacco dependence).   ondansetron  4 MG disintegrating tablet Commonly known  as: ZOFRAN -ODT Take 1 tablet (4 mg total) by mouth every 8 (eight) hours as needed. What changed: reasons to take this   Ozempic  (2 MG/DOSE) 8 MG/3ML Sopn Generic drug: Semaglutide  (2 MG/DOSE) Inject 2 mg into the skin once a week. What changed: when to take this   pantoprazole  40 MG tablet Commonly known as: PROTONIX  TAKE 1 TABLET BY MOUTH TWICE A DAY What changed: when to take this   pregabalin  150 MG capsule Commonly known as: LYRICA  TAKE 1 CAPSULE BY MOUTH TWICE A DAY   PRESCRIPTION MEDICATION CPAP- At bedtime and during any times of rest   TYLENOL  500 MG tablet Generic drug: acetaminophen  Take 500-1,000 mg by mouth every 6 (six) hours as needed for mild pain (pain score 1-3) (or headaches- when not taking Ibuprofen ).   umeclidinium-vilanterol 62.5-25 MCG/ACT Aepb Commonly known as: Anoro Ellipta  Inhale 1 puff into the lungs daily.               Durable Medical Equipment  (From admission, onward)           Start     Ordered   04/28/24 1018  DME Oxygen   Once       Question Answer Comment  Length of Need 6 Months   Mode or (Route) Nasal cannula   Liters per Minute 2   Frequency Continuous (stationary and portable oxygen  unit needed)   Oxygen  delivery system: Gas   Oxygen  delivery system: Concentrator   Oxygen  delivery system: Gas/Tank      04/28/24 1018            Follow-up Information     Thedora Garnette HERO, MD. Schedule an appointment as soon as possible for a visit in 1 week(s).   Specialty: Family Medicine Contact information: [address] [place] [address] [phone]                Discharge Exam:    Subjective: Feeling better.  UP walking this AM.  Needs O2 at time of dc and she is in agreement with this.  Smoking cessation encouraged.   Objective: Vitals:   04/28/24 0548 04/28/24 0837  BP: (!) 106/59   Pulse: 77   Resp:    Temp: 98.6 F (37 C)   SpO2: 100% 99%    Intake/Output Summary (Last 24  hours) at 04/28/2024 1019 Last data filed at 04/28/2024 0800 Gross per 24 hour  Intake 920 ml  Output --  Net 920 ml   Filed Weights   04/26/24 0500 04/27/24 0500 04/28/24 0500  Weight: 118.9 kg 122.2 kg 122.4 kg    Exam:  General:  Appears calm and comfortable and is in NAD, on [place] O2 Eyes:  normal lids, iris ENT:  grossly normal hearing, lips & tongue, mmm Cardiovascular:  RRR. No LE edema.  Respiratory:  Mild expiratory wheezing with significantly improved air movement.  Normal respiratory effort.  On [place] O2. Abdomen:  soft, NT, ND Skin:  no rash or induration seen on limited exam Musculoskeletal:  grossly normal tone BUE/BLE, good ROM, no bony abnormality Psychiatric:  grossly normal mood and affect, speech fluent and appropriate, AOx3 Neurologic:  CN 2-12 grossly intact, moves all extremities in coordinated fashion  Data Reviewed: I have reviewed the patient's lab results since admission.  Pertinent labs for today include:   CO2 33 Glucose 148 WBC 16 Hgb 11.7    Condition at discharge: improving  The results of significant diagnostics from this hospitalization (including imaging, microbiology, ancillary and laboratory) are listed below for reference.   Imaging Studies: DG Chest 2 View Result Date: 04/22/2024 CLINICAL DATA:  Shortness of breath.  Cough. EXAM: CHEST - 2 VIEW COMPARISON:  06/09/2022, CT 10/16/2023 FINDINGS: The cardiomediastinal contours are normal. Aortic atherosclerosis. Moderate peribronchial thickening. Subsegmental opacity in the lung bases. No confluent airspace disease. Pulmonary vasculature is normal. No pleural effusion, or pneumothorax. No acute osseous abnormalities are seen. IMPRESSION: 1. Moderate peribronchial thickening, can be seen with bronchitis or asthma. 2. Subsegmental bibasilar opacities, favor atelectasis. Electronically Signed   By: Andrea Gasman M.D.   On: 04/22/2024 23:15    Microbiology: Results for orders placed or performed  during the hospital encounter of 04/22/24  Resp panel by RT-PCR (RSV, Flu A&B, Covid) Anterior Nasal Swab     Status: None   Collection Time: 04/22/24  9:34 PM   Specimen: Anterior Nasal Swab  Result Value Ref Range Status   SARS Coronavirus 2 by RT PCR NEGATIVE NEGATIVE Final    Comment: (NOTE) SARS-CoV-2 target nucleic acids are NOT DETECTED.  The SARS-CoV-2 RNA is generally detectable in upper respiratory specimens during the acute phase of infection. The lowest concentration of SARS-CoV-2 viral copies this assay can detect is 138 copies/mL. A negative result does not preclude SARS-Cov-2 infection and should not be used as the sole basis for treatment or other patient management decisions. A negative result may occur with  improper specimen collection/handling, submission of specimen other than nasopharyngeal swab, presence of viral mutation(s) within the areas targeted by this assay, and inadequate number of viral copies(<138 copies/mL). A negative result must be combined with clinical observations, patient history, and epidemiological information. The expected result is Negative.  Fact Sheet for Patients:  bloggercourse.com  Fact Sheet for Healthcare Providers:  seriousbroker.it  This test is no t yet approved or cleared by the [place]  FDA and  has been authorized for detection and/or diagnosis of SARS-CoV-2 by FDA under an Emergency Use Authorization (EUA). This EUA will remain  in effect (meaning this test can be used) for the duration of the COVID-19 declaration under Section 564(b)(1) of the Act, 21 U.S.C.section 360bbb-3(b)(1), unless the authorization is terminated  or revoked sooner.       Influenza A by PCR NEGATIVE NEGATIVE Final   Influenza B by PCR NEGATIVE NEGATIVE Final    Comment: (NOTE) The Xpert Xpress SARS-CoV-2/FLU/RSV plus assay is intended as an aid in the diagnosis of influenza from  Nasopharyngeal swab specimens and should not be used as a sole basis for treatment. Nasal washings and aspirates are unacceptable for Xpert Xpress SARS-CoV-2/FLU/RSV testing.  Fact Sheet for Patients: bloggercourse.com  Fact Sheet for Healthcare Providers: seriousbroker.it  This test is not yet approved or cleared by the [place]  FDA and has been authorized for detection and/or diagnosis of SARS-CoV-2 by  FDA under an Emergency Use Authorization (EUA). This EUA will remain in effect (meaning this test can be used) for the duration of the COVID-19 declaration under Section 564(b)(1) of the Act, 21 U.S.C. section 360bbb-3(b)(1), unless the authorization is terminated or revoked.     Resp Syncytial Virus by PCR NEGATIVE NEGATIVE Final    Comment: (NOTE) Fact Sheet for Patients: bloggercourse.com  Fact Sheet for Healthcare Providers: seriousbroker.it  This test is not yet approved or cleared by the [place]  FDA and has been authorized for detection and/or diagnosis of SARS-CoV-2 by FDA under an Emergency Use Authorization (EUA). This EUA will remain in effect (meaning this test can be used) for the duration of the COVID-19 declaration under Section 564(b)(1) of the Act, 21 U.S.C. section 360bbb-3(b)(1), unless the authorization is terminated or revoked.  Performed at [facility], [address] [place], [place] [address]   Respiratory (~20 pathogens) panel by PCR     Status: Abnormal   Collection Time: 04/22/24  9:34 PM   Specimen: Nasopharyngeal Swab; Respiratory  Result Value Ref Range Status   Adenovirus NOT DETECTED NOT DETECTED Final   Coronavirus 229E NOT DETECTED NOT DETECTED Final    Comment: (NOTE) The Coronavirus on the Respiratory Panel, DOES NOT test for the novel  Coronavirus (2019 nCoV)    Coronavirus HKU1 NOT DETECTED NOT DETECTED  Final   Coronavirus NL63 NOT DETECTED NOT DETECTED Final   Coronavirus OC43 NOT DETECTED NOT DETECTED Final   Metapneumovirus NOT DETECTED NOT DETECTED Final   Rhinovirus / Enterovirus DETECTED (A) NOT DETECTED Final   Influenza A NOT DETECTED NOT DETECTED Final   Influenza B NOT DETECTED NOT DETECTED Final   Parainfluenza Virus 1 NOT DETECTED NOT DETECTED Final   Parainfluenza Virus 2 NOT DETECTED NOT DETECTED Final   Parainfluenza Virus 3 NOT DETECTED NOT DETECTED Final   Parainfluenza Virus 4 NOT DETECTED NOT DETECTED Final   Respiratory Syncytial Virus NOT DETECTED NOT DETECTED Final   Bordetella pertussis NOT DETECTED NOT DETECTED Final   Bordetella Parapertussis NOT DETECTED NOT DETECTED Final   Chlamydophila pneumoniae NOT DETECTED NOT DETECTED Final   Mycoplasma pneumoniae NOT DETECTED NOT DETECTED Final    Comment: Performed at [facility], [address], [place], [place] [address]  Culture, blood (Routine X 2) w Reflex to ID Panel     Status: None   Collection Time: 04/23/24  6:40 AM   Specimen: BLOOD  Result Value Ref Range Status   Specimen Description   Final    BLOOD SITE NOT SPECIFIED Performed at [facility], [address] [place], [place] [address]    Special Requests   Final    BOTTLES DRAWN AEROBIC AND ANAEROBIC Blood Culture adequate volume Performed at [facility], [address], [place], [place] [address]    Culture   Final    NO GROWTH 5 DAYS Performed at [facility], [address] [place], [place] [address]    Report Status 04/28/2024 FINAL  Final    Labs: CBC: Recent Labs  Lab 04/22/24 2134 04/23/24 0354 04/24/24 0858 04/26/24 0343 04/28/24 0337  WBC 16.0* 12.5* 18.0* 13.8* 16.0*  NEUTROABS  --   --   --  8.6*  --   HGB 13.4 12.8 12.7 12.0 11.7*  HCT 43.8 41.5 40.7 39.4 37.9  MCV 83.9 83.5 84.3 83.8 82.9  PLT 252 191 200 230 226   Basic Metabolic Panel: Recent  Labs  Lab  04/22/24 2134 04/23/24 0354 04/24/24 0858 04/26/24 0343 04/28/24 0337  NA 136  --  141 142 140  K 3.4*  --  4.3 4.4 3.8  CL 100  --  104 102 100  CO2 24  --  27 33* 33*  GLUCOSE 181*  --  172* 123* 148*  BUN 7  --  12 17 18   CREATININE 0.78 0.74 0.69 0.85 0.73  CALCIUM  9.2  --  9.2 9.1 9.0  MG  --   --  2.3  --   --    Liver Function Tests: No results for input(s): AST, ALT, ALKPHOS, BILITOT, PROT, ALBUMIN in the last 168 hours. CBG: Recent Labs  Lab 04/27/24 0744 04/27/24 1107 04/27/24 1634 04/27/24 1946 04/28/24 0727  GLUCAP 97 172* 284* 99 97    Discharge time spent: greater than 30 minutes.  Signed: Delon Herald, MD [facility] 04/28/2024 "

## 2024-04-28 NOTE — TOC Transition Note (Signed)
 Transition of Care [facility]) - Discharge Note   Patient Details  Name: [name] MRN: [id number] Date of Birth: March 30, 1966  [facility] CM/SW Contact:  Tawni CHRISTELLA Eva, LCSW Phone Number: 04/28/2024, 10:56 AM   Clinical Narrative:     The CSW spoke with the pt to discuss the recommendation for home oxygen . The pt is agreeable and has no preference for an agency. The CSW sent a referral to [employer]; oxygen  is to be delivered to the pts room prior to discharge. The pt reports that her family will provide transportation upon discharge. ICM signed off.  Final next level of care: Home/Self Care Barriers to Discharge: Barriers Resolved   Patient Goals and CMS Choice Patient states their goals for this hospitalization and ongoing recovery are:: retrun home CMS Medicare.gov Compare Post Acute Care list provided to:: Patient Choice offered to / list presented to : Patient      Discharge Placement                    Patient and family notified of of transfer: 04/28/24  Discharge Plan and Services Additional resources added to the After Visit Summary for                  DME Arranged: Oxygen  DME Agency: [employer] Date DME Agency Contacted: 04/28/24 Time DME Agency Contacted: 1054 Representative spoke with at DME Agency: [place]            Social Drivers of Health (SDOH) Interventions SDOH Screenings   Food Insecurity: No Food Insecurity (04/24/2024)  Housing: Low Risk (04/24/2024)  Transportation Needs: No Transportation Needs (04/24/2024)  Utilities: Not At Risk (04/24/2024)  Alcohol Screen: Low Risk (01/13/2024)  Depression (PHQ2-9): Medium Risk (01/14/2024)  Financial Resource Strain: Medium Risk (01/13/2024)  Physical Activity: Insufficiently Active (01/13/2024)  Social Connections: Moderately Integrated (04/24/2024)  Stress: Stress Concern Present (01/13/2024)  Tobacco Use: High Risk (04/22/2024)  Health Literacy: Adequate Health  Literacy (02/09/2023)     Readmission Risk Interventions    06/13/2022    8:55 AM  Readmission Risk Prevention Plan  Transportation Screening Complete  PCP or Specialist Appt within 3-5 Days Complete  HRI or Home Care Consult Complete  Social Work Consult for Recovery Care Planning/Counseling Complete  Palliative Care Screening Not Applicable  Medication Review [employer]) Complete

## 2024-04-28 NOTE — Assessment & Plan Note (Addendum)
-  Encourage cessation.   ?-This was discussed with the patient and should be reviewed on an ongoing basis.   ?-Patch ordered  ?

## 2024-04-28 NOTE — Progress Notes (Signed)
 Discharge meds in a secure bag delivered to patient by this RN

## 2024-04-28 NOTE — Assessment & Plan Note (Addendum)
"  Continue atorvastatin   "

## 2024-04-28 NOTE — Assessment & Plan Note (Addendum)
 No antihypertensives on presentation Added Norvasc  5 mg daily, increased to 10 mg daily but current BP is 106/59 so will decrease back to 5 mg daily for now

## 2024-04-28 NOTE — Progress Notes (Signed)
SATURATION QUALIFICATIONS: (This note is used to comply with regulatory documentation for home oxygen)  Patient Saturations on Room Air at Rest = 94%  Patient Saturations on Room Air while Ambulating = 88%  Patient Saturations on 2 Liters of oxygen while Ambulating = 93%  Please briefly explain why patient needs home oxygen: 

## 2024-04-28 NOTE — Assessment & Plan Note (Addendum)
 Patient is not on home O2, presented with O2 sats to 79%; she reports needing O2 with prior exacerbations, but not qualifying for home O2 On 10 L high flow nasal cannula in the ER Wean off O2 supplementation as tolerated - currently on 3L [name] O2 Continues to smoke, cessation encouraged CXR with moderate peribronchial thickening, likely viral in nature RVP came back positive for rhinovirus, which is the likely source Given Ceftriaxone  and IV azithromycin  x 1 in ER; completed 5 days of azithromycin  Flu negative, stopped Tamiflu  Received IV Decadron  -> Solu-Medrol , completed 5 total days DuoNebs every 6 hours and Albuterol  every 2 hours as needed for shortness of breath and wheezing As needed antitussives Incentive spirometer Early mobilization Resume Anoro (has been out of this medication and is requesting a refill at time of dc) She has continued to feel better and is ready to dc to home today with home O2

## 2024-04-28 NOTE — Assessment & Plan Note (Addendum)
 Resume Adderall at time of dc Continue Klonopin  as needed for anxiety Continue Abilify , Duloxetine 

## 2024-04-28 NOTE — Assessment & Plan Note (Addendum)
 Last hemoglobin A1c 7.0 on 01/14/2024, good control Continue glargine, resume Ozempic  Heart healthy/carb modified diet Continue pregabalin  for neuropathy

## 2024-04-28 NOTE — Assessment & Plan Note (Addendum)
 Body mass index is 46.31 kg/m.  Weight loss should be encouraged on an ongoing basis Resume Ozempic  at time of discharge Outpatient PCP/bariatric medicine f/u encouraged Significantly low or high BMI is associated with higher medical risk including morbidity and mortality

## 2024-04-29 ENCOUNTER — Telehealth: Payer: Self-pay

## 2024-04-29 NOTE — Transitions of Care (Post Inpatient/ED Visit) (Signed)
" ° °  04/29/2024  Name: [name] MRN: [id number] DOB: Nov 27, 1965  Today's TOC FU Call Status: Today's TOC FU Call Status:: Unsuccessful Call (1st Attempt) Unsuccessful Call (1st Attempt) Date: 04/29/24  Attempted to reach the patient regarding the most recent Inpatient/ED visit. Left a HIPAA approved voicemail message to phone number provided in demographics per DPR.    Follow Up Plan: Additional outreach attempts will be made to reach the patient to complete the Transitions of Care (Post Inpatient/ED visit) call.  Richerd Fish, RN, BSN, CCM [facility], [facility] Management Coordinator Direct Dial: [phone]        "

## 2024-04-30 ENCOUNTER — Telehealth: Payer: Self-pay

## 2024-04-30 NOTE — Transitions of Care (Post Inpatient/ED Visit) (Signed)
" ° °  04/30/2024  Name: Der Gagliano MRN: [id number] DOB: 10-Mar-1966  Today's TOC FU Call Status: Today's TOC FU Call Status:: Unsuccessful Call (2nd Attempt) Unsuccessful Call (2nd Attempt) Date: 04/30/24  Attempted to reach the patient regarding the most recent Inpatient/ED visit. Unable to leave voicemail message on phone number provided as preferred and on DPR.  Follow Up Plan: Additional outreach attempts will be made to reach the patient to complete the Transitions of Care (Post Inpatient/ED visit) call.   Richerd Fish, RN, BSN, CCM [facility], [facility] Management Coordinator Direct Dial: [phone]        "

## 2024-05-01 ENCOUNTER — Telehealth: Payer: Self-pay

## 2024-05-01 NOTE — Transitions of Care (Post Inpatient/ED Visit) (Signed)
" ° °  05/01/2024  Name: [name] MRN: [id number] DOB: 1966/04/06  Today's TOC FU Call Status: Today's TOC FU Call Status:: Unsuccessful Call (3rd Attempt) Unsuccessful Call (3rd Attempt) Date: 05/01/24  Attempted to reach the patient regarding the most recent Inpatient/ED visit. Unable to leave a voicemail message to phone number listed on DPR.  Follow Up Plan: No further outreach attempts will be made at this time. We have been unable to contact the patient.  Richerd Fish, RN, BSN, CCM [facility], [facility] Management Coordinator Direct Dial: [phone]        "

## 2024-05-20 ENCOUNTER — Encounter: Payer: Self-pay | Admitting: Family Medicine

## 2024-05-20 ENCOUNTER — Ambulatory Visit: Payer: Self-pay | Admitting: Family Medicine

## 2024-05-20 ENCOUNTER — Ambulatory Visit: Admitting: Family Medicine

## 2024-05-20 VITALS — BP 128/68 | HR 106 | Temp 98.2°F | Ht 64.0 in | Wt 265.6 lb

## 2024-05-20 DIAGNOSIS — Z7985 Long-term (current) use of injectable non-insulin antidiabetic drugs: Secondary | ICD-10-CM

## 2024-05-20 DIAGNOSIS — F17200 Nicotine dependence, unspecified, uncomplicated: Secondary | ICD-10-CM | POA: Diagnosis not present

## 2024-05-20 DIAGNOSIS — F332 Major depressive disorder, recurrent severe without psychotic features: Secondary | ICD-10-CM | POA: Diagnosis not present

## 2024-05-20 DIAGNOSIS — J4489 Other specified chronic obstructive pulmonary disease: Secondary | ICD-10-CM

## 2024-05-20 DIAGNOSIS — R195 Other fecal abnormalities: Secondary | ICD-10-CM

## 2024-05-20 DIAGNOSIS — Z794 Long term (current) use of insulin: Secondary | ICD-10-CM

## 2024-05-20 DIAGNOSIS — I10 Essential (primary) hypertension: Secondary | ICD-10-CM | POA: Diagnosis not present

## 2024-05-20 DIAGNOSIS — E782 Mixed hyperlipidemia: Secondary | ICD-10-CM

## 2024-05-20 DIAGNOSIS — Z6841 Body Mass Index (BMI) 40.0 and over, adult: Secondary | ICD-10-CM

## 2024-05-20 DIAGNOSIS — F1911 Other psychoactive substance abuse, in remission: Secondary | ICD-10-CM

## 2024-05-20 DIAGNOSIS — E1142 Type 2 diabetes mellitus with diabetic polyneuropathy: Secondary | ICD-10-CM

## 2024-05-20 LAB — LIPID PANEL
Cholesterol: 132 mg/dL (ref 28–200)
HDL: 43.4 mg/dL
LDL Cholesterol: 70 mg/dL (ref 10–99)
NonHDL: 88.53
Total CHOL/HDL Ratio: 3
Triglycerides: 93 mg/dL (ref 10.0–149.0)
VLDL: 18.6 mg/dL (ref 0.0–40.0)

## 2024-05-20 LAB — BASIC METABOLIC PANEL WITH GFR
BUN: 13 mg/dL (ref 6–23)
CO2: 27 meq/L (ref 19–32)
Calcium: 9.2 mg/dL (ref 8.4–10.5)
Chloride: 102 meq/L (ref 96–112)
Creatinine, Ser: 0.84 mg/dL (ref 0.40–1.20)
GFR: 76.71 mL/min
Glucose, Bld: 116 mg/dL — ABNORMAL HIGH (ref 70–99)
Potassium: 4.2 meq/L (ref 3.5–5.1)
Sodium: 137 meq/L (ref 135–145)

## 2024-05-20 LAB — URINALYSIS, ROUTINE W REFLEX MICROSCOPIC
Hgb urine dipstick: NEGATIVE
Leukocytes,Ua: NEGATIVE
Nitrite: NEGATIVE
RBC / HPF: NONE SEEN
Specific Gravity, Urine: >=1.03 — AB (ref 1.000–1.030)
Urine Glucose: NEGATIVE
Urobilinogen, UA: 1 (ref 0.0–1.0)
pH: 5.5 (ref 5.0–8.0)

## 2024-05-20 LAB — MICROALBUMIN / CREATININE URINE RATIO
Creatinine,U: 247.3 mg/dL
Microalb Creat Ratio: 6.7 mg/g (ref 0.0–30.0)
Microalb, Ur: 1.7 mg/dL (ref 0.7–1.9)

## 2024-05-20 LAB — HEMOGLOBIN A1C: Hgb A1c MFr Bld: 7.6 % — ABNORMAL HIGH (ref 4.6–6.5)

## 2024-05-20 MED ORDER — BLOOD GLUCOSE MONITORING SUPPL DEVI: 1.0000 | 0 refills | Status: AC

## 2024-05-20 MED ORDER — BLOOD GLUCOSE TEST VI STRP: 1.0000 | ORAL_STRIP | 0 refills | Status: AC

## 2024-05-20 MED ORDER — ALBUTEROL SULFATE HFA 108 (90 BASE) MCG/ACT IN AERS: 2.0000 | INHALATION_SPRAY | Freq: Four times a day (QID) | RESPIRATORY_TRACT | 6 refills | Status: AC | PRN

## 2024-05-20 MED ORDER — VARENICLINE TARTRATE 0.5 MG PO TABS: ORAL_TABLET | ORAL | 0 refills | Status: AC

## 2024-05-20 MED ORDER — TIRZEPATIDE 2.5 MG/0.5ML ~~LOC~~ SOAJ: 2.5000 mg | SUBCUTANEOUS | 2 refills | Status: AC

## 2024-05-20 MED ORDER — VARENICLINE TARTRATE 1 MG PO TABS: 1.0000 mg | ORAL_TABLET | Freq: Two times a day (BID) | ORAL | 5 refills | Status: AC

## 2024-05-20 MED ORDER — LANCET DEVICE MISC: 1.0000 | 0 refills | Status: AC

## 2024-05-20 MED ORDER — LANCETS MISC: 1.0000 | 0 refills | Status: AC

## 2024-05-20 NOTE — Assessment & Plan Note (Signed)
 I will check lipids today. Continue atorvastatin  20 mg twice daily.

## 2024-05-20 NOTE — Assessment & Plan Note (Signed)
 [name] is experiencing an anniversary reaction on top of her chronic depression related to the death of her husband a year ago. I recommend she follow-up with Dr. Okey.

## 2024-05-20 NOTE — Assessment & Plan Note (Addendum)
 I continue to advise smoking cessation and encouraged her to continue to make attempts. We discussed the difficulty that having other smokers in her home creates for her. Discharged from most recent hospitalization with nicotine  patch, but is not effective. We discussed a repeat trial og Chantix  to see if this will help her quit.  I spent 5 minutes counseling the patient about tobacco cessation.

## 2024-05-20 NOTE — Assessment & Plan Note (Signed)
"  Maintaining sobriety  "

## 2024-05-20 NOTE — Assessment & Plan Note (Signed)
 I will send a new referral to see about rescheduling her colonoscopy.

## 2024-05-20 NOTE — Assessment & Plan Note (Addendum)
 Blood pressure is at goal off meds. Her prior weight loss likely contributes to this.

## 2024-05-20 NOTE — Assessment & Plan Note (Signed)
 Stable. Continue pregabalin 150 mg bid.

## 2024-05-20 NOTE — Assessment & Plan Note (Addendum)
 I will check her annual DM labs. Continue insulin  glargine 20 units daily. I will try switching her from semaglutide  (Ozempic ) to tirzepatide  (Mounjaro ). I will order her a new glucometer so she can be monitoring her blood sugars at home.

## 2024-05-20 NOTE — Progress Notes (Signed)
 " [facility]IMARY [facility]ARE [facility] [address] [place] [address] Dept: [phone] Dept Fax: [phone]  Office Visit  Subjective:    Patient ID: [name], female    DOB: [birth year], 59 y.o..   MRN: [id number]  Chief Complaint  Patient presents with   Diabetes    Follow up, Rx refill of Albuterol  Inhaler and Glucometer, and discuss recent hospital stay   History of Present Illness:  Patient is in today for a hospital follow up. [name] was admitted at [facility] 04/22/2024-04/28/2024 having presented with a sudden onset of shortness of breath, cough, body aches, nasal congestion, headaches, and diarrhea for 2 days. She was  tachycardic, tachypneic and hypoxic with SpO2 in the 80s on room air. Her Viral Respiratory Panel was positive for adenovirus/rhinovirus. Her chest x-ray revealed moderate peribronchial thickening and subsegmental bibasilar opacities favoring atelectasis. Due to concern for early pneumonia and acute COPD exacerbation, she received Rocephin  and IV azithromycin , prednisone  60 mg, and DuoNeb in the ED before admittance. By discharge she was on 10L HFNC O2 and completed a course of azithromycin . She was also discharged with an Anoro Ellipta , and Duonebs, and PRN Albuterol  nebulizer treatments. [name] notes she was able to stop the oxygen  after about one week. She feels her breathing is improved, though she still has some occasional cough. [name] has a history of COPD and OSA. She also continues to smoke about 1 ppd of cigarettes. She notes it has been several years since she took Chantix . She noted this did not seem to cause her to feel any different when smoking. She did have some benefit from bupropion , but broke out in hives on this. She had a nicotine  patch placed int he hospital. She found this helped a small amount. She has three other adult smokers living in her home, which makes it harder for her to quit.  [name] has a history of Type 2 diabetes. She is managed on insulin  glargine (Lantus ) 20 units daily and semaglutide  (Ozempic ) 2 mg weekly. Her diabetes is complicated by peripheral neuropathy. This is managed with pregabalin  (Lyrica ) 150 mg bid. She is also on duloxetine  (Cymbalta ) 60 mg daily, but this has been more for treatment of depression.   [name] has a history of hypertension. She had been managed on losartan  25 mg daily. This was stopped in Sept, due to lightheadedness. During her hospitalization, she was on amlodipine  5 mg, but is not taking this currently.  [name] has a history of major depression and ADHD. She is managed by Dr. Barnie Gull on Adderall, aripiprazole , clonazepam , and duloxetine . She is approaching the 1st anniversary of her husband's death. She finds her overall life is difficult. On top of her grief, she struggles financially. Her daughter, son-in-law, two young kids, and her adult nephew all live in her household. The adults are not contributing reliably to help with bills and the home is crowded.  As a note, she was scheduled to undergo her colonoscopy on 04/28/2024, but this cancelled due to hospitalizaiton.  Past Medical History: Patient Active Problem List   Diagnosis Date Noted   Rhinovirus infection 04/24/2024   Acute exacerbation of chronic obstructive pulmonary disease (COPD) (HCC) 04/23/2024   Viral URI with cough 01/14/2024   Positive colorectal cancer screening using Cologuard test 10/29/2023   Coronary atherosclerosis 05/23/2022   Aortic atherosclerosis 05/23/2022   Arthritis of left knee 12/14/2021   Acute on chronic hypoxic respiratory failure (HCC)  06/02/2021   Marijuana use 03/14/2021   Allergic rhinitis 03/14/2021   Angina pectoris 09/08/2019   Benzodiazepine overdose 03/05/2019   Generalized muscle ache 09/24/2018   Chronic right ear pain 03/11/2018   Chronic sinusitis 03/11/2018   H/O drug abuse (HCC) 11/12/2017   Atypical chest pain  10/05/2017   COPD with chronic bronchitis (HCC) 10/01/2017   Mixed hyperlipidemia 10/01/2017   Tobacco use disorder 10/01/2017   Diabetic peripheral neuropathy (HCC) 10/01/2017   Iron deficiency anemia 10/01/2017   Adult attention deficit hyperactivity disorder 01/14/2016   Major depressive disorder, recurrent severe without psychotic features (HCC)    Leukocytosis 03/25/2014   OSA on CPAP 04/11/2013   Perforated ear drum, right 05/22/2012   Bell's palsy 01/23/2012   Carpal tunnel syndrome on both sides 01/11/2012   Vitamin D  deficiency 07/06/2011   Asthmatic bronchitis 06/11/2011   Low back pain 06/11/2011   Morbid obesity with body mass index (BMI) of 40.0 to 44.9 in adult [facility]) 05/06/2011   Gastric erosions 12/05/2010   Essential hypertension 12/02/2010   Type 2 diabetes mellitus with polyneuropathy (HCC) 12/02/2010   COPD with acute exacerbation (HCC) 11/30/2010   GERD (gastroesophageal reflux disease) 09/08/2010   Esophageal dysphagia 09/08/2010   Past Surgical History:  Procedure Laterality Date   cataract surgery Left 03/18/2023   CESAREAN SECTION  1988; 1989   CHOLECYSTECTOMY OPEN  1990   COLONOSCOPY  09/16/2010   DOQ:wnmfjo colon/small internal hemorrhoids   ESOPHAGOGASTRODUODENOSCOPY  09/16/2010   SLF: normal/mild gastritis   ESOPHAGOGASTRODUODENOSCOPY N/A 10/19/2014   Procedure: ESOPHAGOGASTRODUODENOSCOPY (EGD);  Surgeon: Margo LITTIE Haddock, MD;  Location: [facility] ENDO SUITE;  Service: Endoscopy;  Laterality: N/A;  830   FRACTURE SURGERY     HYSTEROSCOPY WITH THERMACHOICE  01/17/2012   Procedure: HYSTEROSCOPY WITH THERMACHOICE;  Surgeon: Vonn VEAR Inch, MD;  Location: [facility] ORS;  Service: Gynecology;  Laterality: N/A;  total therapy time: 9:13sec  D5W  18 ml in, D5W   18ml out, temperture 87degrees celcious   KIDNEY SURGERY     as child for blockages   LEFT HEART CATH AND CORONARY ANGIOGRAPHY N/A 09/08/2019   Procedure: LEFT HEART CATH AND CORONARY ANGIOGRAPHY;  Surgeon:  Jordan, Peter M, MD;  Location: [facility] INVASIVE CV LAB;  Service: Cardiovascular;  Laterality: N/A;   TONSILLECTOMY     TUBAL LIGATION  1989   TYMPANOSTOMY TUBE PLACEMENT Bilateral    several times when I was a child   uterine ablation     WRIST FRACTURE SURGERY Left 1995   Family History  Problem Relation Age of Onset   Heart attack Mother 29       deceased   Diabetes Mother    Breast cancer Mother 17       inflammatory breast ca   Heart failure Mother        oxygen  dependence, nonsmoker   Heart disease Mother    Depression Mother    Cancer Mother        Breast   Hypertension Mother    Hyperlipidemia Mother    Sudden death Mother    Sleep apnea Mother    Obesity Mother    Heart attack Father 59       deceased, etoh use   Heart disease Father    Alcohol abuse Father    Depression Father    Heart failure Father    Sudden death Father    Ulcers Sister    Hypertension Sister    Heart failure Sister  Depression Sister    Anxiety disorder Sister    Liver disease Maternal Aunt 40       died while on liver transplant list   Liver disease Maternal Uncle    Cancer Paternal Aunt        Breast   Heart disease Maternal Grandmother    Heart attack Maternal Grandmother        premature CAD   Colon cancer Neg Hx    Outpatient Medications Prior to Visit  Medication Sig Dispense Refill   ACCU-CHEK GUIDE test strip USE UP TO FOUR TIMES DAILY AS DIRECTED 200 strip 11   Accu-Chek Softclix Lancets lancets USE UP TO FOUR TIMES DAILY AS DIRECTED 300 each 0   albuterol  (PROVENTIL ) (2.5 MG/3ML) 0.083% nebulizer solution Take 3 mLs (2.5 mg total) by nebulization every 6 (six) hours as needed for wheezing or shortness of breath. 75 mL 0   albuterol  (VENTOLIN  HFA) 108 (90 Base) MCG/ACT inhaler Inhale 2 puffs into the lungs every 6 (six) hours as needed for wheezing or shortness of breath. 8 g 6   amLODipine  (NORVASC ) 10 MG tablet Take 0.5 tablets (5 mg total) by mouth daily. 15 tablet 0    amphetamine -dextroamphetamine  (ADDERALL) 30 MG tablet Take 1 tablet by mouth 2 (two) times daily. (Patient taking differently: Take 30 mg by mouth See admin instructions. Take 30 mg by mouth in the morning and afternoon) 60 tablet 0   ARIPiprazole  (ABILIFY ) 5 MG tablet Take 1 tablet (5 mg total) by mouth daily. 90 tablet 1   atorvastatin  (LIPITOR) 40 MG tablet TAKE 0.5 TABLETS (20 MG TOTAL) BY MOUTH 2 (TWO) TIMES DAILY. (Patient taking differently: Take 40 mg by mouth in the morning.) 90 tablet 3   bismuth subsalicylate (PEPTO BISMOL) 262 MG/15ML suspension Take 30 mLs by mouth every 6 (six) hours as needed for indigestion.     blood glucose meter kit and supplies Dispense based on patient and insurance preference. Use up to four times daily as directed. (FOR ICD-10 E10.9, E11.9). 1 each 0   clonazePAM  (KLONOPIN ) 0.5 MG tablet Take 1 tablet (0.5 mg total) by mouth 2 (two) times daily as needed for anxiety. 60 tablet 2   DULoxetine  (CYMBALTA ) 60 MG capsule Take 1 capsule (60 mg total) by mouth in the morning. 90 capsule 2   fluticasone  (FLONASE ) 50 MCG/ACT nasal spray PLACE 1 SPRAY INTO BOTH NOSTRILS 2 (TWO) TIMES DAILY AS NEEDED FOR ALLERGIES OR RHINITIS (AFTER SINUS RINSE). 48 mL 2   Dextromethorphan -guaiFENesin  (ROBAFEN DM) 20-200 MG/20ML LIQD Take 5 mLs by mouth every 4 (four) hours as needed. 118 mL 0   insulin  glargine (LANTUS  SOLOSTAR) 100 UNIT/ML Solostar Pen Inject 20 Units into the skin daily. (Patient taking differently: Inject 20 Units into the skin daily after breakfast.) 15 mL PRN   Insulin  Pen Needle (BD PEN NEEDLE NANO 2ND GEN) 32G X 4 MM MISC USE DAILY WITH VICTOZA  100 each 11   Insulin  Syringe-Needle U-100 (INSULIN  SYRINGE .5CC/31GX5/16) 31G X 5/16 0.5 ML MISC Inject 1 Units into the muscle daily. use as directed 100 each 2   ipratropium-albuterol  (DUONEB) 0.5-2.5 (3) MG/3ML SOLN TAKE 3 MLS BY NEBULIZATION 3 (THREE) TIMES DAILY. 360 mL 3   meloxicam  (MOBIC ) 15 MG tablet Take 1 tablet  (15 mg total) by mouth daily. 90 tablet 3   nicotine  (NICODERM CQ  - DOSED IN MG/24 HOURS) 21 mg/24hr patch Place 1 patch (21 mg total) onto the skin daily as needed (tobacco dependence).  28 patch 0   ondansetron  (ZOFRAN -ODT) 4 MG disintegrating tablet Take 1 tablet (4 mg total) by mouth every 8 (eight) hours as needed. (Patient taking differently: Take 4 mg by mouth every 8 (eight) hours as needed for nausea or vomiting (dissolve orally).) 20 tablet 2   pantoprazole  (PROTONIX ) 40 MG tablet TAKE 1 TABLET BY MOUTH TWICE A DAY (Patient taking differently: Take 40 mg by mouth 2 (two) times daily before a meal.) 180 tablet 3   pregabalin  (LYRICA ) 150 MG capsule TAKE 1 CAPSULE BY MOUTH TWICE A DAY 180 capsule 3   PRESCRIPTION MEDICATION CPAP- At bedtime and during any times of rest     Semaglutide , 2 MG/DOSE, (OZEMPIC , 2 MG/DOSE,) 8 MG/3ML SOPN Inject 2 mg into the skin once a week. (Patient taking differently: Inject 2 mg into the skin every Sunday.) 12 mL 3   TYLENOL  500 MG tablet Take 500-1,000 mg by mouth every 6 (six) hours as needed for mild pain (pain score 1-3) (or headaches- when not taking Ibuprofen ).     umeclidinium-vilanterol (ANORO ELLIPTA ) 62.5-25 MCG/ACT AEPB Inhale 1 puff into the lungs daily. 60 each 0   No facility-administered medications prior to visit.   Allergies[1]   Objective:   Today's Vitals   05/20/24 1034  BP: 128/68  Pulse: (!) 106  Temp: 98.2 F (36.8 C)  SpO2: 97%  Weight: 265 lb 9.6 oz (120.5 kg)  Height: 5' 4 (1.626 m)   Body mass index is 45.59 kg/m.   General: Well developed, well nourished. No acute distress. Lungs: Occasional mild wheezes with some bibasilar rhonchi and coarse respirations. Psych: Alert and oriented. Mood and affect are both depressed.  Health Maintenance Due  Topic Date Due   Hepatitis B Vaccines 19-59 Average Risk (1 of 3 - 19+ 3-dose series) Never done   Zoster Vaccines- Shingrix (1 of 2) Never done   Diabetic kidney evaluation  - Urine ACR  10/02/2018   OPHTHALMOLOGY EXAM  05/25/2021   Colonoscopy  08/23/2023   Medicare Annual Wellness (AWV)  02/09/2024   Cervical Cancer Screening (HPV/Pap Cotest)  02/24/2024   Imaging DG Chest 2 View Result Date: 04/22/2024  IMPRESSION:  1. Moderate peribronchial thickening, can be seen with bronchitis or asthma.  2. Subsegmental bibasilar opacities, favor atelectasis.     Assessment & Plan:   Problem List Items Addressed This Visit       Cardiovascular and Mediastinum   Essential hypertension   Blood pressure is at goal off meds. Her prior weight loss likely contributes to this.      Relevant Orders   Basic metabolic panel with GFR     Respiratory   COPD with chronic bronchitis (HCC) - Primary   Stable. Continue Anoro Ellipta , Duonebs and albuterol  PRN. No sign of acute exacerbation. I recommend she follow-up with her pulmonologist. I also strongly advise smoking cessation.      Relevant Medications   albuterol  (VENTOLIN  HFA) 108 (90 Base) MCG/ACT inhaler   varenicline  (CHANTIX ) 0.5 MG tablet   varenicline  (CHANTIX ) 1 MG tablet   Other Relevant Orders   Ambulatory referral to Pulmonology     Endocrine   Diabetic peripheral neuropathy (HCC)   Stable. Continue pregabalin  150 mg bid.      Relevant Medications   tirzepatide  (MOUNJARO ) 2.5 MG/0.5ML Pen   varenicline  (CHANTIX ) 0.5 MG tablet   varenicline  (CHANTIX ) 1 MG tablet   Type 2 diabetes mellitus with polyneuropathy (HCC)   I will check her annual DM  labs. Continue insulin  glargine 20 units daily. I will try switching her from semaglutide  (Ozempic ) to tirzepatide  (Mounjaro ). I will order her a new glucometer so she can be monitoring her blood sugars at home.      Relevant Medications   Blood Glucose Monitoring Suppl DEVI   Glucose Blood (BLOOD GLUCOSE TEST STRIPS) STRP   Lancet Device MISC   Lancets MISC   tirzepatide  (MOUNJARO ) 2.5 MG/0.5ML Pen   varenicline  (CHANTIX ) 0.5 MG tablet   varenicline   (CHANTIX ) 1 MG tablet   Other Relevant Orders   Lipid panel   Microalbumin / creatinine urine ratio   Basic metabolic panel with GFR   Hemoglobin A1c   Urinalysis, Routine w reflex microscopic     Other   Tobacco use disorder (Chronic)   I continue to advise smoking cessation and encouraged her to continue to make attempts. We discussed the difficulty that having other smokers in her home creates for her. Discharged from most recent hospitalization with nicotine  patch, but is not effective. We discussed a repeat trial og Chantix  to see if this will help her quit.  I spent 5 minutes counseling the patient about tobacco cessation.      Relevant Medications   varenicline  (CHANTIX ) 0.5 MG tablet   varenicline  (CHANTIX ) 1 MG tablet   H/O drug abuse (HCC)   Maintaining sobriety.      Major depressive disorder, recurrent severe without psychotic features [facility])   [name] is experiencing an anniversary reaction on top of her chronic depression related to the death of her husband a year ago. I recommend she follow-up with Dr. Okey.      Mixed hyperlipidemia   I will check lipids today. Continue atorvastatin  20 mg twice daily.      Relevant Orders   Lipid panel   Morbid obesity- Max BMI= 49.75 (HCC)   Will try switching to tirzepatide  (Mounjaro ).      Relevant Medications   tirzepatide  (MOUNJARO ) 2.5 MG/0.5ML Pen   Positive colorectal cancer screening using Cologuard test   I will send a new referral to see about rescheduling her colonoscopy.      Relevant Orders   Ambulatory referral to Gastroenterology   Other Visit Diagnoses       Long-term current use of injectable noninsulin antidiabetic medication         Encounter for long-term (current) insulin  use (HCC)           Return in about 3 months (around 08/18/2024) for Reassessment.    Garnette CHRISTELLA Simpler, MD  I,Emily Lagle,acting as a scribe for Garnette CHRISTELLA Simpler, MD.,have documented all relevant documentation on the behalf  of Garnette CHRISTELLA Simpler, MD.  I, Garnette CHRISTELLA Simpler, MD, have reviewed all documentation for this visit. The documentation on 05/20/2024 for the exam, diagnosis, procedures, and orders are all accurate and complete.      [1]  Allergies Allergen Reactions   Codeine  Itching and Nausea Only   Metformin  And Related Diarrhea, Nausea Only and Other (See Comments)    Stomach pain/nausea   Wellbutrin  [Bupropion  Hcl] Hives   Acyclovir And Related Rash   "

## 2024-05-20 NOTE — Assessment & Plan Note (Addendum)
 Stable. Continue Anoro Ellipta , Duonebs and albuterol  PRN. No sign of acute exacerbation. I recommend she follow-up with her pulmonologist. I also strongly advise smoking cessation.

## 2024-05-20 NOTE — Assessment & Plan Note (Signed)
 Will try switching to tirzepatide  (Mounjaro ).

## 2024-05-21 ENCOUNTER — Telehealth (HOSPITAL_COMMUNITY): Payer: Self-pay | Admitting: Psychiatry

## 2024-05-21 ENCOUNTER — Encounter (HOSPITAL_COMMUNITY): Payer: Self-pay | Admitting: Psychiatry

## 2024-05-21 DIAGNOSIS — F901 Attention-deficit hyperactivity disorder, predominantly hyperactive type: Secondary | ICD-10-CM

## 2024-05-21 DIAGNOSIS — F411 Generalized anxiety disorder: Secondary | ICD-10-CM

## 2024-05-21 DIAGNOSIS — F331 Major depressive disorder, recurrent, moderate: Secondary | ICD-10-CM

## 2024-05-21 MED ORDER — DULOXETINE HCL 60 MG PO CPEP: 60.0000 mg | ORAL_CAPSULE | Freq: Two times a day (BID) | ORAL | 2 refills | Status: AC

## 2024-05-21 MED ORDER — CLONAZEPAM 0.5 MG PO TABS: 0.5000 mg | ORAL_TABLET | Freq: Three times a day (TID) | ORAL | 2 refills | Status: AC | PRN

## 2024-05-21 MED ORDER — ARIPIPRAZOLE 5 MG PO TABS: 5.0000 mg | ORAL_TABLET | Freq: Every day | ORAL | 1 refills | Status: AC

## 2024-05-21 MED ORDER — AMPHETAMINE-DEXTROAMPHETAMINE 30 MG PO TABS: 30.0000 mg | ORAL_TABLET | Freq: Two times a day (BID) | ORAL | 0 refills | Status: AC

## 2024-05-21 NOTE — Progress Notes (Signed)
 Virtual Visit via Video Note  I connected with [name] on 05/21/24 at 10:40 AM EST by a video enabled telemedicine application and verified that I am speaking with the correct person using two identifiers.  Location: Patient: home Provider: office   I discussed the limitations of evaluation and management by telemedicine and the availability of in person appointments. The patient expressed understanding and agreed to proceed.     I discussed the assessment and treatment plan with the patient. The patient was provided an opportunity to ask questions and all were answered. The patient agreed with the plan and demonstrated an understanding of the instructions.   The patient was advised to call back or seek an in-person evaluation if the symptoms worsen or if the condition fails to improve as anticipated.  I provided 20 minutes of non-face-to-face time during this encounter.   Barnie Gull, MD  [facility] MD/PA/NP OP Progress Note  05/21/2024 10:55 AM [name]  MRN:  [id number]  Chief Complaint:  Chief Complaint  Patient presents with   Anxiety   Depression   HPI:  This patient is a 59 year old widowed white female who lives with her daughter and 2 grandsons in [place]. She is on disability.    The patient returns for follow-up after 3 months regarding her major depression generalized anxiety and ADD without hyperactivity  The patient states that she has been more depressed and anxious lately.  The holidays were difficult because it was the first 1 without her husband who died last 06/04/24.  She is coming up to the anniversary of his death.  She has been more sad and more in full about this.  She has been crying more and feeling very anxious.  She has been staying in her room quite a bit and does not have much energy to interact with other people.  She has not been to church in several weeks.  She denies any thoughts of self-harm or suicide but sometimes  wishes she would die.  She has been taking her medications but does not feel like they are helping.  I suggested that we go up a little bit on the clonazepam  because of her anxiety and also increase the Cymbalta .  I strongly suggested therapy and she agrees.  She is going to try to get back into church once the weather improves. Visit Diagnosis:    ICD-10-CM   1. Moderate episode of recurrent major depressive disorder (HCC)  F33.1     2. Attention deficit hyperactivity disorder (ADHD), predominantly hyperactive type  F90.1     3. Generalized anxiety disorder  F41.1       Past Psychiatric History: The patient was hospitalized twice in her 60s and again in 06-04-04 and 2018-06-04 for drug overdose with suicidal intent   Past Medical History:  Past Medical History:  Diagnosis Date   ADHD    Alcohol abuse    Anxiety    Asthmatic bronchitis    normal PFT/ seen by pulmonary no evidence of COPD   Atypical chest pain 10/05/2017   Back pain    Bilateral swelling of feet    Chest pain    Chest pain on respiration 03/25/2014   Chronic bronchitis (HCC)    Chronic lower back pain    Chronic respiratory failure (HCC) 10/16/2011   Newly 02 dep 24/7 p discharge from [facility] 01/2013  - 03/13/2014  Walked RA  2 laps @ 185 ft each stopped due to  Sob/ aching in  legs, thirsty/ no desat @ slow pace    Chronic respiratory failure with hypoxia (HCC)    On 2-3 L of oxygen  at home   Cigarette smoker 12/02/2010   Followed in Pulmonary clinic/ [facility]/ [place]   - Limits of effective care reviewed 12/22/2011     Complication of anesthesia    Constipation    COPD (chronic obstructive pulmonary disease) (HCC)    COPD with chronic bronchitis (HCC) 10/01/2017   Daily headache    Depression    Diabetic peripheral neuropathy (HCC)    DM (diabetes mellitus) type II controlled, neurological manifestation (HCC) 12/02/2010   Drug use    Gallbladder problem    Gastric erosions    EGD 08/2010.   GERD  (gastroesophageal reflux disease)    Glaucoma    H/O drug abuse (HCC) 11/12/2017   -- scanned document from outside source: [facility] in [place]  which showed UDS positive for Adderall /amphetamine  usage as well as positive urine for THC.  This test result was collected 08/11/2016 and reported 10/07/2016. - lso review of the chart shows another positive amphetamine , THC and METH in the urine back on 10/18/2015 under care everywhere. ---So    Heavy menses    High cholesterol    History of blood transfusion    related to low HgB (10/05/2017)   History of hiatal hernia    HTN (hypertension)    Hyperlipidemia associated with type 2 diabetes mellitus (HCC) 10/18/2015   Hypertension associated with diabetes (HCC) 12/02/2010   D/c acei 12/22/2011 due to psuedowheeze and narcotic dependent cough> ? Improved - 03/14/2014 started bystolic in place of cozar due to cough     IBS (irritable bowel syndrome)    Increased urinary protein excretion    Internal hemorrhoids    Colonoscopy 5/12.   Joint pain    Mixed diabetic hyperlipidemia associated with type 2 diabetes mellitus (HCC) 10/01/2017   On home oxygen  therapy    5L at night (10/05/2017)   OSA on CPAP    Osteoarthritis    back (10/05/2017)   Oxygen  dependent 10/16/2011   Pneumonia    lots of times (10/05/2017)   PONV (postoperative nausea and vomiting)    Poorly controlled diabetes mellitus (HCC) 11/12/2017   Pulmonary infiltrates 12/22/2011   Followed in Pulmonary clinic/ [facility]/ [place]    - See CT Chest 05/05/11    Shortness of breath    Tachycardia    never had test done since no insurance   Tobacco use disorder-current smoker greater than 40-pack-year history- since age 38 1 ppd 10/01/2017   Type II diabetes mellitus (HCC)    Vitamin D  deficiency     Past Surgical History:  Procedure Laterality Date   cataract surgery Left 03/18/2023   CESAREAN SECTION  1988; 1989    CHOLECYSTECTOMY OPEN  1990   COLONOSCOPY  09/16/2010   DOQ:wnmfjo colon/small internal hemorrhoids   ESOPHAGOGASTRODUODENOSCOPY  09/16/2010   SLF: normal/mild gastritis   ESOPHAGOGASTRODUODENOSCOPY N/A 10/19/2014   Procedure: ESOPHAGOGASTRODUODENOSCOPY (EGD);  Surgeon: Margo LITTIE Haddock, MD;  Location: [facility];  Service: Endoscopy;  Laterality: N/A;  830   FRACTURE SURGERY     HYSTEROSCOPY WITH THERMACHOICE  01/17/2012   Procedure: HYSTEROSCOPY WITH THERMACHOICE;  Surgeon: Vonn VEAR Inch, MD;  Location: [facility] ORS;  Service: Gynecology;  Laterality: N/A;  total therapy time: 9:13sec  D5W  18 ml in, D5W   18ml out, temperture 87degrees celcious  KIDNEY SURGERY     as child for blockages   LEFT HEART CATH AND CORONARY ANGIOGRAPHY N/A 09/08/2019   Procedure: LEFT HEART CATH AND CORONARY ANGIOGRAPHY;  Surgeon: Jordan, Peter M, MD;  Location: [facility] INVASIVE CV LAB;  Service: Cardiovascular;  Laterality: N/A;   TONSILLECTOMY     TUBAL LIGATION  1989   TYMPANOSTOMY TUBE PLACEMENT Bilateral    several times when I was a child   uterine ablation     WRIST FRACTURE SURGERY Left 1995    Family Psychiatric History: See below  Family History:  Family History  Problem Relation Age of Onset   Heart attack Mother 71       deceased   Diabetes Mother    Breast cancer Mother 28       inflammatory breast ca   Heart failure Mother        oxygen  dependence, nonsmoker   Heart disease Mother    Depression Mother    Cancer Mother        Breast   Hypertension Mother    Hyperlipidemia Mother    Sudden death Mother    Sleep apnea Mother    Obesity Mother    Heart attack Father 61       deceased, etoh use   Heart disease Father    Alcohol abuse Father    Depression Father    Heart failure Father    Sudden death Father    Ulcers Sister    Hypertension Sister    Heart failure Sister    Depression Sister    Anxiety disorder Sister    Liver disease Maternal Aunt 40       died while on liver  transplant list   Liver disease Maternal Uncle    Cancer Paternal Aunt        Breast   Heart disease Maternal Grandmother    Heart attack Maternal Grandmother        premature CAD   Colon cancer Neg Hx     Social History:  Social History   Socioeconomic History   Marital status: Widowed    Spouse name: Not on file   Number of children: 2   Years of education: Not on file   Highest education level: Some college, no degree  Occupational History   Occupation: unemployed  Tobacco Use   Smoking status: Every Day    Current packs/day: 1.50    Average packs/day: 1.5 packs/day for 46.1 years (69.1 ttl pk-yrs)    Types: Cigarettes    Start date: 57   Smokeless tobacco: Never   Tobacco comments:    vape  Vaping Use   Vaping status: Former  Substance and Sexual Activity   Alcohol use: Not Currently   Drug use: Yes    Types: Marijuana    Comment: occ   Sexual activity: Not Currently    Partners: Male    Birth control/protection: Surgical    Comment: BTL, younger than 16, more than 5  Other Topics Concern   Not on file  Social History Narrative   Has 2 step children.   Lives in multi-family home with children and grandchildren.   On disability.   Social Drivers of Health   Tobacco Use: High Risk (05/21/2024)   Patient History    Smoking Tobacco Use: Every Day    Smokeless Tobacco Use: Never    Passive Exposure: Not on file  Financial Resource Strain: Medium Risk (01/13/2024)   Overall Financial Resource Strain (  CARDIA)    Difficulty of Paying Living Expenses: Somewhat hard  Food Insecurity: No Food Insecurity (04/24/2024)   Epic    Worried About Programme Researcher, Broadcasting/film/video in the Last Year: Never true    Ran Out of Food in the Last Year: Never true  Transportation Needs: No Transportation Needs (04/24/2024)   Epic    Lack of Transportation (Medical): No    Lack of Transportation (Non-Medical): No  Physical Activity: Insufficiently Active (01/13/2024)   Exercise Vital Sign     Days of Exercise per Week: 1 day    Minutes of Exercise per Session: 20 min  Stress: Stress Concern Present (01/13/2024)   [employer] of Occupational Health - Occupational Stress Questionnaire    Feeling of Stress: To some extent  Social Connections: Moderately Integrated (04/24/2024)   Social Connection and Isolation Panel    Frequency of Communication with Friends and Family: Twice a week    Frequency of Social Gatherings with Friends and Family: Once a week    Attends Religious Services: More than 4 times per year    Active Member of [employer] or Organizations: Yes    Attends Banker Meetings: More than 4 times per year    Marital Status: Widowed  Depression (PHQ2-9): High Risk (05/20/2024)   Depression (PHQ2-9)    PHQ-2 Score: 18  Alcohol Screen: Low Risk (01/13/2024)   Alcohol Screen    Last Alcohol Screening Score (AUDIT): 1  Housing: Low Risk (04/24/2024)   Epic    Unable to Pay for Housing in the Last Year: No    Number of Times Moved in the Last Year: 0    Homeless in the Last Year: No  Utilities: Not At Risk (04/24/2024)   Epic    Threatened with loss of utilities: No  Health Literacy: Adequate Health Literacy (02/09/2023)   B1300 Health Literacy    Frequency of need for help with medical instructions: Never    Allergies: Allergies[1]  Metabolic Disorder Labs: Lab Results  Component Value Date   HGBA1C 7.6 (H) 05/20/2024   MPG 166 10/05/2023   MPG 177.16 06/02/2021   No results found for: PROLACTIN Lab Results  Component Value Date   CHOL 132 05/20/2024   TRIG 93.0 05/20/2024   HDL 43.40 05/20/2024   CHOLHDL 3 05/20/2024   VLDL 18.6 05/20/2024   LDLCALC 70 05/20/2024   LDLCALC 57 12/14/2021   Lab Results  Component Value Date   TSH 1.860 06/30/2019   TSH 2.40 11/26/2018    Therapeutic Level Labs: No results found for: LITHIUM No results found for: VALPROATE No results found for: CBMZ  Current Medications: Current Outpatient  Medications  Medication Sig Dispense Refill   albuterol  (PROVENTIL ) (2.5 MG/3ML) 0.083% nebulizer solution Take 3 mLs (2.5 mg total) by nebulization every 6 (six) hours as needed for wheezing or shortness of breath. 75 mL 0   albuterol  (VENTOLIN  HFA) 108 (90 Base) MCG/ACT inhaler Inhale 2 puffs into the lungs every 6 (six) hours as needed for wheezing or shortness of breath. 8 g 6   amphetamine -dextroamphetamine  (ADDERALL) 30 MG tablet Take 1 tablet by mouth 2 (two) times daily. 60 tablet 0   ARIPiprazole  (ABILIFY ) 5 MG tablet Take 1 tablet (5 mg total) by mouth daily. 90 tablet 1   atorvastatin  (LIPITOR) 40 MG tablet TAKE 0.5 TABLETS (20 MG TOTAL) BY MOUTH 2 (TWO) TIMES DAILY. 90 tablet 3   bismuth subsalicylate (PEPTO BISMOL) 262 MG/15ML suspension Take 30 mLs by  mouth every 6 (six) hours as needed for indigestion.     blood glucose meter kit and supplies Dispense based on patient and insurance preference. Use up to four times daily as directed. (FOR ICD-10 E10.9, E11.9). 1 each 0   Blood Glucose Monitoring Suppl DEVI 1 each by Does not apply route as directed. Dispense based on patient and insurance preference. Use up to four times daily as directed. (FOR ICD-10 E10.9, E11.9). 1 each 0   clonazePAM  (KLONOPIN ) 0.5 MG tablet Take 1 tablet (0.5 mg total) by mouth 3 (three) times daily as needed for anxiety. 90 tablet 2   Dextromethorphan -guaiFENesin  (ROBAFEN DM) 20-200 MG/20ML LIQD Take 5 mLs by mouth every 4 (four) hours as needed. 118 mL 0   DULoxetine  (CYMBALTA ) 60 MG capsule Take 1 capsule (60 mg total) by mouth 2 (two) times daily. 180 capsule 2   fluticasone  (FLONASE ) 50 MCG/ACT nasal spray PLACE 1 SPRAY INTO BOTH NOSTRILS 2 (TWO) TIMES DAILY AS NEEDED FOR ALLERGIES OR RHINITIS (AFTER SINUS RINSE). 48 mL 2   Glucose Blood (BLOOD GLUCOSE TEST STRIPS) STRP 1 each by Does not apply route as directed. Dispense based on patient and insurance preference. Use up to four times daily as directed. (FOR  ICD-10 E10.9, E11.9). 100 strip 0   insulin  glargine (LANTUS  SOLOSTAR) 100 UNIT/ML Solostar Pen Inject 20 Units into the skin daily. (Patient taking differently: Inject 20 Units into the skin daily after breakfast.) 15 mL PRN   Insulin  Pen Needle (BD PEN NEEDLE NANO 2ND GEN) 32G X 4 MM MISC USE DAILY WITH VICTOZA  100 each 11   Insulin  Syringe-Needle U-100 (INSULIN  SYRINGE .5CC/31GX5/16) 31G X 5/16 0.5 ML MISC Inject 1 Units into the muscle daily. use as directed 100 each 2   ipratropium-albuterol  (DUONEB) 0.5-2.5 (3) MG/3ML SOLN TAKE 3 MLS BY NEBULIZATION 3 (THREE) TIMES DAILY. 360 mL 3   Lancet Device MISC 1 each by Does not apply route as directed. Dispense based on patient and insurance preference. Use up to four times daily as directed. (FOR ICD-10 E10.9, E11.9). 1 each 0   Lancets MISC 1 each by Does not apply route as directed. Dispense based on patient and insurance preference. Use up to four times daily as directed. (FOR ICD-10 E10.9, E11.9). 100 each 0   meloxicam  (MOBIC ) 15 MG tablet Take 1 tablet (15 mg total) by mouth daily. 90 tablet 3   ondansetron  (ZOFRAN -ODT) 4 MG disintegrating tablet Take 1 tablet (4 mg total) by mouth every 8 (eight) hours as needed. (Patient not taking: Reported on 05/20/2024) 20 tablet 2   pantoprazole  (PROTONIX ) 40 MG tablet TAKE 1 TABLET BY MOUTH TWICE A DAY (Patient taking differently: Take 40 mg by mouth 2 (two) times daily before a meal.) 180 tablet 3   pregabalin  (LYRICA ) 150 MG capsule TAKE 1 CAPSULE BY MOUTH TWICE A DAY 180 capsule 3   PRESCRIPTION MEDICATION CPAP- At bedtime and during any times of rest     tirzepatide  (MOUNJARO ) 2.5 MG/0.5ML Pen Inject 2.5 mg into the skin once a week. 2 mL 2   TYLENOL  500 MG tablet Take 500-1,000 mg by mouth every 6 (six) hours as needed for mild pain (pain score 1-3) (or headaches- when not taking Ibuprofen ).     umeclidinium-vilanterol (ANORO ELLIPTA ) 62.5-25 MCG/ACT AEPB Inhale 1 puff into the lungs daily. 60 each 0    varenicline  (CHANTIX ) 0.5 MG tablet Take 1 tablet (0.5 mg total) by mouth daily for 3 days, THEN 1 tablet (  0.5 mg total) 2 (two) times daily for 4 days. 11 tablet 0   varenicline  (CHANTIX ) 1 MG tablet Take 1 tablet (1 mg total) by mouth 2 (two) times daily. 60 tablet 5   No current facility-administered medications for this visit.     Musculoskeletal: Strength & Muscle Tone: within normal limits Gait & Station: normal Patient leans: N/A  Psychiatric Specialty Exam: Review of Systems  Psychiatric/Behavioral:  Positive for dysphoric mood and sleep disturbance. The patient is nervous/anxious.   All other systems reviewed and are negative.   Last menstrual period 07/24/2014.There is no height or weight on file to calculate BMI.  General Appearance: Casual and Fairly Groomed  Eye Contact:  Good  Speech:  Clear and Coherent  Volume:  Decreased  Mood:  Anxious and Depressed  Affect:  Depressed and Flat  Thought Process:  Goal Directed  Orientation:  Full (Time, Place, and Person)  Thought Content: Rumination   Suicidal Thoughts:  No  Homicidal Thoughts:  No  Memory:  Immediate;   Good Recent;   Good Remote;   NA  Judgement:  Fair  Insight:  Fair  Psychomotor Activity:  Decreased  Concentration:  Concentration: Fair and Attention Span: Fair  Recall:  Good  Fund of Knowledge: Good  Language: Good  Akathisia:  No  Handed:  Right  AIMS (if indicated): not done  Assets:  Communication Skills Desire for Improvement Resilience Social Support Talents/Skills  ADL's:  Intact  Cognition: WNL  Sleep:  Fair   Screenings: AIMS    Flowsheet Row [facility]mission [facility]Discharged) [facility]07[facility]2019 [facility]  AIMS Total Score 0 0   AUDIT    Flowsheet Row Admission (Discharged) [facility]/07/2019 [facility] Admission (Discharged) [facility]  Alcohol Use Disorder Identification Test Final Score (AUDIT) 0 0   GAD-7    Flowsheet Row Office Visit from 05/20/2024 in [facility] [employer] Visit from 01/14/2024 in [facility] [place] [facility] [employer] Visit from 07/09/2023 in [facility] [place] [facility] [employer] Visit from 03/20/2022 in [facility] [place] [facility] [employer] Visit from 03/14/2021 in [facility] [place] [facility] [employer]  Total GAD-7 Score 14 3 8 8 6    PHQ2-9    Flowsheet Row Office Visit from 05/20/2024 in [facility] Visit from 01/14/2024 in [facility] [place] [facility] [employer] Visit from 07/09/2023 in [facility] [employer] Clinical Support from 02/09/2023 in [facility] [place] [facility] [employer] Visit from 12/20/2022 in [facility] [place] [facility] [employer]  PHQ-2 Total Score 6 1 6  0 0  PHQ-9 Total Score 18 6 18 2 1    Flowsheet Row ED to Hosp-Admission (Discharged) from 04/22/2024 in [place] LONG 4TH FLOOR PROGRESSIVE CARE AND UROLOGY UC from 06/15/2023 in [facility]) ED to Hosp-Admission (Discharged) from 06/09/2022 in [facility]  HOSPITAL 5 EAST MEDICAL UNIT  C-SSRS RISK CATEGORY No Risk No Risk No Risk     Assessment and Plan: This patient is a 59 year old female with a history of major depression generalized anxiety disorder and ADHD inattentive type.  She has been more depressed and is coming up to the anniversary of her husband's death which probably contributes a good  deal to her low mood.  She will increase Cymbalta  to 60 mg twice daily for depression and increase clonazepam  to 0.5 mg 3 times daily as needed for anxiety.  She will continue Abilify  5 mg daily for antidepressant  augmentation and continue Adderall 30 mg twice daily for ADD.  She will return to see me in 4 weeks  Collaboration of Care: Collaboration of Care: Referral or follow-up with counselor/therapist AEB patient will be referred for therapy with Lauraine Ferrari in our office  Patient/Guardian was advised Release of Information must be obtained prior to any record release in order to collaborate their care with an outside provider. Patient/Guardian was advised if they have not already done so to contact the registration department to sign all necessary forms in order for us  to release information regarding their care.   Consent: Patient/Guardian gives verbal consent for treatment and assignment of benefits for services provided during this visit. Patient/Guardian expressed understanding and agreed to proceed.    Barnie Gull, MD 05/21/2024, 10:55 AM     [1]  Allergies Allergen Reactions   Codeine  Itching and Nausea Only   Metformin  And Related Diarrhea, Nausea Only and Other (See Comments)    Stomach pain/nausea   Wellbutrin  [Bupropion  Hcl] Hives   Acyclovir And Related Rash

## 2024-05-22 ENCOUNTER — Telehealth: Payer: Self-pay

## 2024-05-22 NOTE — Telephone Encounter (Signed)
 Received a notice from patients insurance that Albuterol  Inhaler will not be covered and they allowed a 30 day supply.  Will have to have patient call her insurance to find out what is covered and let us  know.   Was unable to leave a VM due to mailbox is full. Dm/cma

## 2024-06-10 ENCOUNTER — Ambulatory Visit (HOSPITAL_COMMUNITY)

## 2024-06-18 ENCOUNTER — Ambulatory Visit (HOSPITAL_COMMUNITY): Admitting: Psychiatry

## 2024-08-18 ENCOUNTER — Ambulatory Visit: Admitting: Family Medicine
